# Patient Record
Sex: Male | Born: 1955
Health system: Southern US, Community
[De-identification: ages and names within clinical notes are randomized; demographics above are authoritative.]

## PROBLEM LIST (undated history)

## (undated) ENCOUNTER — Emergency Department (HOSPITAL_COMMUNITY)

## (undated) DIAGNOSIS — N189 Chronic kidney disease, unspecified: Secondary | ICD-10-CM

## (undated) DIAGNOSIS — R350 Frequency of micturition: Secondary | ICD-10-CM

## (undated) DIAGNOSIS — M869 Osteomyelitis, unspecified: Secondary | ICD-10-CM

## (undated) DIAGNOSIS — I739 Peripheral vascular disease, unspecified: Secondary | ICD-10-CM

## (undated) DIAGNOSIS — G709 Myoneural disorder, unspecified: Secondary | ICD-10-CM

## (undated) DIAGNOSIS — M720 Palmar fascial fibromatosis [Dupuytren]: Secondary | ICD-10-CM

## (undated) DIAGNOSIS — K635 Polyp of colon: Secondary | ICD-10-CM

## (undated) DIAGNOSIS — R631 Polydipsia: Secondary | ICD-10-CM

## (undated) DIAGNOSIS — E291 Testicular hypofunction: Secondary | ICD-10-CM

## (undated) DIAGNOSIS — K651 Peritoneal abscess: Secondary | ICD-10-CM

## (undated) DIAGNOSIS — R358 Other polyuria: Secondary | ICD-10-CM

## (undated) DIAGNOSIS — I2510 Atherosclerotic heart disease of native coronary artery without angina pectoris: Secondary | ICD-10-CM

## (undated) DIAGNOSIS — I219 Acute myocardial infarction, unspecified: Secondary | ICD-10-CM

## (undated) DIAGNOSIS — E781 Pure hyperglyceridemia: Secondary | ICD-10-CM

## (undated) DIAGNOSIS — M86271 Subacute osteomyelitis, right ankle and foot: Secondary | ICD-10-CM

## (undated) DIAGNOSIS — R809 Proteinuria, unspecified: Secondary | ICD-10-CM

## (undated) DIAGNOSIS — I509 Heart failure, unspecified: Secondary | ICD-10-CM

## (undated) DIAGNOSIS — R3589 Other polyuria: Secondary | ICD-10-CM

## (undated) DIAGNOSIS — J189 Pneumonia, unspecified organism: Secondary | ICD-10-CM

## (undated) DIAGNOSIS — I10 Essential (primary) hypertension: Secondary | ICD-10-CM

## (undated) DIAGNOSIS — Z973 Presence of spectacles and contact lenses: Secondary | ICD-10-CM

## (undated) HISTORY — DX: Polydipsia: R63.1

## (undated) HISTORY — DX: Essential (primary) hypertension: I10

## (undated) HISTORY — PX: INSERT / REPLACE / REMOVE PACEMAKER: SUR710

## (undated) HISTORY — DX: Other polyuria: R35.8

## (undated) HISTORY — DX: Subacute osteomyelitis, right ankle and foot: M86.271

## (undated) HISTORY — DX: Pure hyperglyceridemia: E78.1

## (undated) HISTORY — DX: Acute myocardial infarction, unspecified: I21.9

## (undated) HISTORY — DX: Proteinuria, unspecified: R80.9

## (undated) HISTORY — DX: Palmar fascial fibromatosis (dupuytren): M72.0

## (undated) HISTORY — DX: Peritoneal abscess: K65.1

## (undated) HISTORY — PX: COLONOSCOPY W/ BIOPSIES AND POLYPECTOMY: SHX1376

## (undated) HISTORY — DX: Peripheral vascular disease, unspecified: I73.9

## (undated) HISTORY — DX: Polyp of colon: K63.5

## (undated) HISTORY — DX: Testicular hypofunction: E29.1

## (undated) HISTORY — DX: Frequency of micturition: R35.0

## (undated) HISTORY — DX: Myoneural disorder, unspecified: G70.9

## (undated) HISTORY — DX: Other polyuria: R35.89

## (undated) HISTORY — PX: OTHER SURGICAL HISTORY: SHX169

## (undated) HISTORY — PX: CATARACT EXTRACTION, BILATERAL: SHX1313

---

## 2007-04-10 DIAGNOSIS — K651 Peritoneal abscess: Secondary | ICD-10-CM

## 2007-04-10 HISTORY — DX: Peritoneal abscess: K65.1

## 2010-09-17 ENCOUNTER — Ambulatory Visit: Payer: Self-pay | Admitting: Vascular Surgery

## 2010-10-29 ENCOUNTER — Ambulatory Visit: Payer: Self-pay | Admitting: Vascular Surgery

## 2011-01-14 ENCOUNTER — Other Ambulatory Visit: Payer: Self-pay | Admitting: Gastroenterology

## 2011-03-24 NOTE — Procedures (Signed)
LOWER EXTREMITY ARTERIAL DUPLEX   INDICATION:  Claudication, rule out peripheral vascular disease per  standing orders.   HISTORY:  Diabetes:  Yes.  Cardiac:  No.  Hypertension:  Yes.  Smoking:  Yes.  Previous Surgery:  No.   SINGLE LEVEL ARTERIAL EXAM                          RIGHT                LEFT  Brachial:               143                  150  Anterior tibial:        88                   152  Posterior tibial:       86                   158  Peroneal:  Ankle/Brachial Index:   0.59                 1.05   LOWER EXTREMITY ARTERIAL DUPLEX EXAM   DUPLEX:  No flow was adequately detected in the right proximal to distal  superficial femoral artery with collateral flow noted at the distal  thigh level.  Patent right common femoral, proximal profunda femoral,  and popliteal arteries.   IMPRESSION:  1. Possible occlusion of the right superficial femoral artery, as      described above.  2. Moderately decreased right ABI with normal left ABI noted.   ___________________________________________  Di Kindle. Edilia Bo, M.D.   CH/MEDQ  D:  09/17/2010  T:  09/17/2010  Job:  [id number]

## 2011-03-24 NOTE — Consult Note (Signed)
VASCULAR SURGERY CONSULTATION   [place], AL  DOB:  06-22-1956                                       10/29/2010  CHART#:[id number]   I saw [name] in the office today in consultation concerning his right  calf claudication.  He was referred by Dr. Talmage Nap.  This is a pleasant 55-  year-old gentleman who had no history of right calf claudication which  began around October 2011.  Symptoms came on gradually.  He noticed  cramping pain in his right calf associated with ambulation and relieved  with rest.  He does a lot of traveling and notices this when walking in  the airport.  He was sent for an arterial Doppler study which was done  in November and showed an ABI of 59% on the right and 100% on the left.  Since his study has been done, he states that his symptoms have actually  improved gradually.  He does experience some right calf claudication.  There are no other aggravating or alleviating factors.  He had no  history of rest pain.  No history of nonhealing wounds.   PAST MEDICAL HISTORY:  Significant for adult onset diabetes which has  been stable on his current medications.  In addition, he has  hypertension and hypercholesterolemia both of which are stable.  He  denies any history of myocardial infarction, history of congestive heart  failure or history of COPD.   SOCIAL HISTORY:  He is married.  He does not have any children.  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  He smokes a pack per day of cigarettes and he has been smoking for 25  years.  He is in the process of trying to quit.  He does not drink  alcohol on a regular basis.   FAMILY HISTORY:  He denies any history of premature cardiovascular  disease.   REVIEW OF SYSTEMS:  GENERAL:  He has had no weight loss, weight gain or  his appetite.  CARDIOVASCULAR:  He has had no chest pain, chest pressure, palpitations  or arrhythmias.  He has had no history of  stroke, TIAs or amaurosis  fugax.  He has had no history of DVT or phlebitis.  GI, neurologic, pulmonary, hematologic, GU, ENT, musculoskeletal,  psychiatric, integumentary review of systems is unremarkable as  documented on the medical history form in his chart.   PHYSICAL EXAMINATION:  This is a pleasant 55 year old gentleman who  appears his stated age.  Temperature is 97.8, blood pressure 135/72, heart rate is 71.  HEENT:  Unremarkable.  LUNGS:  Clear bilaterally to auscultation without rales, rhonchi or  wheezing.  CARDIOVASCULAR:  I do not detect any carotid bruits.  He has a regular  rate and rhythm.  He has palpable femoral pulses.  I cannot palpate the  right popliteal pulse or pedal pulses on the right.  He does have a  palpable left popliteal pulse and pedal pulses on the left in the  dorsalis pedis and posterior tibial positions.  He has no significant  lower extremity swelling.  He has some slight hyperpigmentation  bilaterally.  ABDOMEN:  Soft and nontender with normal pitched bowel sounds.  It is  somewhat difficult to assess for aneurysm because of his size.  MUSCULOSKELETAL:  He has no major deformities  or cyanosis.  NEUROLOGIC:  He has no focal weakness or paresthesias.  SKIN:  There are no ulcers or rashes.   I have independently interpreted his arterial Doppler study which shows  an ABI of 59% on the right and 100% on the left.  Duplex of the  superficial femoral artery shows the superficial femoral artery is  occluded with reconstitution of the popliteal artery and monophasic flow  distally.   I have explained the importance of getting on a structured walking  program.  We have also discussed the importance of tobacco cessation.  I  explained to him I can also write him a prescription for cilostazol  although I do not typically favor starting this given that he is on  quite a few medications already and clearly smoking cessation and  walking are most  important.  With some slight hyperpigmentation  bilaterally, he may have some mild chronic venous insufficiency and we  have discussed the importance of intermittent leg elevation.  He has  been wearing some compression stockings and I have encouraged him not to  wear too tight a stocking on the right leg.  I have asked to see him  back in 1 year and I have ordered follow-up ABIs for that time.  He  knows to call sooner if he has problems.     Di Kindle. Edilia Bo, M.D.  Electronically Signed  CSD/MEDQ  D:  10/29/2010  T:  10/30/2010  Job:  [id number]

## 2011-06-29 ENCOUNTER — Ambulatory Visit (INDEPENDENT_AMBULATORY_CARE_PROVIDER_SITE_OTHER): Payer: BC Managed Care – PPO | Admitting: Ophthalmology

## 2011-06-29 DIAGNOSIS — E11319 Type 2 diabetes mellitus with unspecified diabetic retinopathy without macular edema: Secondary | ICD-10-CM

## 2011-06-29 DIAGNOSIS — H43819 Vitreous degeneration, unspecified eye: Secondary | ICD-10-CM

## 2011-06-29 DIAGNOSIS — Q120 Congenital cataract: Secondary | ICD-10-CM

## 2011-06-29 DIAGNOSIS — E11359 Type 2 diabetes mellitus with proliferative diabetic retinopathy without macular edema: Secondary | ICD-10-CM

## 2011-07-15 ENCOUNTER — Encounter (INDEPENDENT_AMBULATORY_CARE_PROVIDER_SITE_OTHER): Payer: BC Managed Care – PPO | Admitting: Ophthalmology

## 2011-07-22 ENCOUNTER — Encounter (INDEPENDENT_AMBULATORY_CARE_PROVIDER_SITE_OTHER): Payer: BC Managed Care – PPO | Admitting: Ophthalmology

## 2011-07-22 DIAGNOSIS — H3581 Retinal edema: Secondary | ICD-10-CM

## 2011-07-22 DIAGNOSIS — E11359 Type 2 diabetes mellitus with proliferative diabetic retinopathy without macular edema: Secondary | ICD-10-CM

## 2011-07-22 DIAGNOSIS — E11319 Type 2 diabetes mellitus with unspecified diabetic retinopathy without macular edema: Secondary | ICD-10-CM

## 2011-07-22 DIAGNOSIS — H43819 Vitreous degeneration, unspecified eye: Secondary | ICD-10-CM

## 2011-09-11 ENCOUNTER — Encounter: Payer: Self-pay | Admitting: *Deleted

## 2011-09-28 ENCOUNTER — Ambulatory Visit (INDEPENDENT_AMBULATORY_CARE_PROVIDER_SITE_OTHER): Payer: BC Managed Care – PPO | Admitting: Family Medicine

## 2011-09-28 ENCOUNTER — Encounter: Payer: Self-pay | Admitting: Family Medicine

## 2011-09-28 VITALS — BP 130/80 | HR 80 | Ht 70.0 in | Wt 258.0 lb

## 2011-09-28 DIAGNOSIS — Z0000 Encounter for general adult medical examination without abnormal findings: Secondary | ICD-10-CM

## 2011-09-28 DIAGNOSIS — E781 Pure hyperglyceridemia: Secondary | ICD-10-CM | POA: Insufficient documentation

## 2011-09-28 DIAGNOSIS — Z23 Encounter for immunization: Secondary | ICD-10-CM

## 2011-09-28 DIAGNOSIS — E119 Type 2 diabetes mellitus without complications: Secondary | ICD-10-CM | POA: Insufficient documentation

## 2011-09-28 DIAGNOSIS — N529 Male erectile dysfunction, unspecified: Secondary | ICD-10-CM

## 2011-09-28 DIAGNOSIS — E11319 Type 2 diabetes mellitus with unspecified diabetic retinopathy without macular edema: Secondary | ICD-10-CM | POA: Insufficient documentation

## 2011-09-28 DIAGNOSIS — F17200 Nicotine dependence, unspecified, uncomplicated: Secondary | ICD-10-CM

## 2011-09-28 DIAGNOSIS — Z125 Encounter for screening for malignant neoplasm of prostate: Secondary | ICD-10-CM

## 2011-09-28 DIAGNOSIS — D126 Benign neoplasm of colon, unspecified: Secondary | ICD-10-CM | POA: Insufficient documentation

## 2011-09-28 DIAGNOSIS — E1139 Type 2 diabetes mellitus with other diabetic ophthalmic complication: Secondary | ICD-10-CM

## 2011-09-28 LAB — POCT URINALYSIS DIPSTICK
Bilirubin, UA: NEGATIVE
Glucose, UA: NEGATIVE
Leukocytes, UA: NEGATIVE
Nitrite, UA: NEGATIVE

## 2011-09-28 LAB — COMPREHENSIVE METABOLIC PANEL
ALT: 11 U/L (ref 0–53)
AST: 13 U/L (ref 0–37)
Albumin: 4.1 g/dL (ref 3.5–5.2)
Alkaline Phosphatase: 90 U/L (ref 39–117)
BUN: 21 mg/dL (ref 6–23)
Calcium: 9.6 mg/dL (ref 8.4–10.5)
Chloride: 106 mEq/L (ref 96–112)
Potassium: 5.3 mEq/L (ref 3.5–5.3)
Sodium: 139 mEq/L (ref 135–145)
Total Protein: 6.5 g/dL (ref 6.0–8.3)

## 2011-09-28 LAB — LIPID PANEL
Cholesterol: 144 mg/dL (ref 0–200)
VLDL: 21 mg/dL (ref 0–40)

## 2011-09-28 NOTE — Progress Notes (Signed)
Eduardo [name] is a 55 y.o. male who presents for a complete physical.  He has the following concerns: Recent GI illness--last episode of diarrhea was Tuesday, finally feeling better Diabetes--sees Dr. Talmage Nap.  Has appt with her in December, but needs labs for insurance purposes before 12/13. Brought in forms to be filled out.  He hasn't had a PCP, last having seen me in 2007, just seeing Dr. Talmage Nap  Immunization History  Administered Date(s) Administered  . Influenza Split 08/10/2011  . Td 06/03/2005  Thinks he had a pneumovax in 2005 Last colonoscopy: 12/2010 Dr. Ewing Schlein, +polyp, due for repeat in 3 years Last PSA: unknown, ?ever Dentist: August, goes twice yearly Ophtho: Dr. Ashley Royalty regularly  Exercise: less in winter than in Spring and summer (kayaks).  Walks, but "not enough".  Travels 40 weeks/year  Past Medical History  Diagnosis Date  . Diabetes mellitus 1987    under care of Dr. Talmage Nap.  On insulin since 96 (off and on)  . Peritoneal abscess 6/08    and buttock.  . Pure hyperglyceridemia   . Essential hypertension, benign   . Diabetic retinopathy   . Dupuytren contracture     R hand, s/p injection (Dr. Izora Ribas)    Past Surgical History  Procedure Date  . Macular photocoagulation     (eye treatments for diabetic retinopathy)-Dr. Ashley Royalty    History   Social History  . Marital Status: Married    Spouse Name: N/A    Number of Children: 0  . Years of Education: N/A   Occupational History  . 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 Smoking status: Current Everyday Smoker -- 1.0 packs/day for 30 years    Types: Cigarettes  . Smokeless tobacco: Not on file   Comment: plans to quit in January  . Alcohol Use: No  . Drug Use: No  . Sexually Active: Not on file   Other Topics Concern  . Not on file   Social History Narrative   Lives with his wife.  Travels 40 weeks out of the year. No pets   Current outpatient  prescriptions:aspirin 81 MG tablet, Take 81 mg by mouth daily.  , Disp: , Rfl: ;  Bioflavonoid Products (ESTER-C) 1000-50 MG TABS, Take 1 capsule by mouth daily.  , Disp: , Rfl: ;  gemfibrozil (LOPID) 600 MG tablet, Take 600 mg by mouth 2 (two) times daily before a meal.  , Disp: , Rfl: ;  Glucosamine HCl-MSM 750-750 MG TABS, Take 2 capsules by mouth daily.  , Disp: , Rfl:  insulin lispro protamine-insulin lispro (HUMALOG 75/25) (75-25) 100 UNIT/ML SUSP, Inject 20 Units into the skin 2 (two) times daily with a meal.  , Disp: , Rfl: ;  lisinopril (PRINIVIL,ZESTRIL) 40 MG tablet, Take 40 mg by mouth daily.  , Disp: , Rfl: ;  sitaGLIPtan-metformin (JANUMET) 50-1000 MG per tablet, Take 1 tablet by mouth 2 (two) times daily with a meal.  , Disp: , Rfl:   No Known Allergies  ROS:  The patient denies fever, weight changes (?slight loss, trying), headaches,  vision loss, decreased hearing, ear pain, hoarseness, chest pain, palpitations, dizziness, syncope, dyspnea on exertion, cough, swelling, nausea, vomiting, diarrhea, constipation, abdominal pain, melena, hematochezia, indigestion/heartburn, hematuria, incontinence, nocturia, weakened urine stream, dysuria, genital lesions, joint pains, numbness, tingling, weakness, tremor, suspicious skin lesions, depression, anxiety, abnormal bleeding/bruising, or enlarged lymph nodes Denies apnea, feels refreshed in mornings. Sees dermatologist regularly (  all biopsies benign). +erectile dysfunction.  Tried Viagra in past, not very effective, but he isn't very interested in fixing.  PHYSICAL EXAM: BP 130/80  Pulse 80  Ht 5\' 10"  (1.778 m)  Wt 258 lb (117.028 kg)  BMI 37.02 kg/m2  General Appearance:    Alert, cooperative, no distress, appears stated age  Head:    Normocephalic, without obvious abnormality, atraumatic  Eyes:    PERRL, conjunctiva/corneas clear, EOM's intact, fundi    Not well visualized  Ears:    Normal TM's and external ear canals  Nose:   Nares  normal, mucosa normal, no drainage or sinus   tenderness  Throat:   Lips, mucosa, and tongue normal; teeth and gums normal  Neck:   Supple, no lymphadenopathy;  thyroid:  no   enlargement/tenderness/nodules; no carotid   bruit or JVD  Back:    Spine nontender, no curvature, ROM normal, no CVA     tenderness  Lungs:     Clear to auscultation bilaterally without wheezes, rales or     ronchi; respirations unlabored  Chest Wall:    No tenderness or deformity   Heart:    Regular rate and rhythm, S1 and S2 normal, no murmur, rub   or gallop  Breast Exam:    No chest wall tenderness, masses or gynecomastia  Abdomen:     Soft, non-tender, nondistended, normoactive bowel sounds,    no masses, no hepatosplenomegaly  Genitalia:    Normal male external genitalia without lesions.  Testicles without masses.  No inguinal hernias.  Rectal:    Normal sphincter tone, no masses or tenderness; guaiac negative stool.  Prostate smooth, no nodules, not enlarged.  Extremities:   No clubbing, cyanosis or edema  Pulses:   2+ and symmetric all extremities  Skin:   Skin color, texture, turgor normal, no rashes or lesions. Very small, superficial ulceration R 2nd toe, medial aspect (sharp lateral aspect of great toenail rubs against it here)  Lymph nodes:   Cervical, supraclavicular, and axillary nodes normal  Neurologic:   CNII-XII intact, normal strength, sensation and gait; reflexes 2+ and symmetric throughout          Psych:   Normal mood, affect, hygiene and grooming.    ASSESSMENT/PLAN:  1. Routine general medical examination at a health care facility  POCT Urinalysis Dipstick, Visual acuity screening  2. Tobacco use disorder    3. Adenomatous colon polyp    4. Need for Tdap vaccination  Tdap vaccine greater than or equal to 7yo IM  5. Need for pneumococcal vaccination  Pneumococcal polysaccharide vaccine 23-valent greater than or equal to 2yo subcutaneous/IM  6. Type II or unspecified type diabetes mellitus  without mention of complication, not stated as uncontrolled  Comprehensive metabolic panel, CBC with Differential, Hemoglobin A1c  7. Special screening for malignant neoplasm of prostate  PSA  8. Pure hyperglyceridemia  Lipid panel  9. Erectile dysfunction  Testosterone  10. Diabetic retinopathy      Discussed PSA screening (risks/benefits), recommended at least 30 minutes of aerobic activity at least 5 days/week; proper sunscreen use reviewed; healthy diet and alcohol recommendations (less than or equal to 2 drinks/day) reviewed; regular seatbelt use; changing batteries in smoke detectors. Self-testicular exams. Immunization recommendations discussed--TdaP and pneumovax given.  Discussed Zostavax at age 56.  Colonoscopy recommendations reviewed--due again 2013.  Discussed need for increased exercise, portion control and weight loss.  Send copies of labs to Dr. Talmage Nap  CBC, c-met, FLP, A1c, PSA, testosterone  drawn today

## 2011-09-28 NOTE — Patient Instructions (Signed)
HEALTH MAINTENANCE RECOMMENDATIONS:  It is recommended that you get at least 30 minutes of aerobic exercise at least 5 days/week (for weight loss, you may need as much as 60-90 minutes). This can be any activity that gets your heart rate up. This can be divided in 10-15 minute intervals if needed, but try and build up your endurance at least once a week.  Weight bearing exercise is also recommended twice weekly.  Eat a healthy diet with lots of vegetables, fruits and fiber.  "Colorful" foods have a lot of vitamins (ie green vegetables, tomatoes, red peppers, etc).  Limit sweet tea, regular sodas and alcoholic beverages, all of which has a lot of calories and sugar.  Up to 2 alcoholic drinks daily may be beneficial for men (unless trying to lose weight, watch sugars).  Drink a lot of water.  Sunscreen of at least SPF 30 should be used on all sun-exposed parts of the skin when outside between the hours of 10 am and 4 pm (not just when at beach or pool, but even with exercise, golf, tennis, and yard work!)  Use a sunscreen that says "broad spectrum" so it covers both UVA and UVB rays, and make sure to reapply every 1-2 hours.  Remember to change the batteries in your smoke detectors when changing your clock times in the spring and fall.  Use your seat belt every time you are in a car, and please drive safely and not be distracted with cell phones and texting while driving.     

## 2011-09-29 ENCOUNTER — Encounter: Payer: Self-pay | Admitting: Family Medicine

## 2011-09-29 LAB — CBC WITH DIFFERENTIAL/PLATELET
Basophils Absolute: 0.1 10*3/uL (ref 0.0–0.1)
Basophils Relative: 1 % (ref 0–1)
Eosinophils Absolute: 1.9 10*3/uL — ABNORMAL HIGH (ref 0.0–0.7)
Hemoglobin: 15.4 g/dL (ref 13.0–17.0)
MCH: 31.5 pg (ref 26.0–34.0)
MCHC: 33.5 g/dL (ref 30.0–36.0)
Neutro Abs: 7.3 10*3/uL (ref 1.7–7.7)
Neutrophils Relative %: 56 % (ref 43–77)
Platelets: 275 10*3/uL (ref 150–400)
RDW: 13.4 % (ref 11.5–15.5)

## 2011-09-29 LAB — HEMOGLOBIN A1C
Hgb A1c MFr Bld: 8.3 % — ABNORMAL HIGH (ref <5.7)
Mean Plasma Glucose: 192 mg/dL — ABNORMAL HIGH (ref <117)

## 2011-11-02 ENCOUNTER — Encounter: Payer: Self-pay | Admitting: Family Medicine

## 2011-11-23 ENCOUNTER — Ambulatory Visit (INDEPENDENT_AMBULATORY_CARE_PROVIDER_SITE_OTHER): Payer: BC Managed Care – PPO | Admitting: Ophthalmology

## 2011-11-27 ENCOUNTER — Ambulatory Visit (INDEPENDENT_AMBULATORY_CARE_PROVIDER_SITE_OTHER): Payer: BC Managed Care – PPO | Admitting: Ophthalmology

## 2011-11-27 DIAGNOSIS — H2510 Age-related nuclear cataract, unspecified eye: Secondary | ICD-10-CM

## 2011-11-27 DIAGNOSIS — E1139 Type 2 diabetes mellitus with other diabetic ophthalmic complication: Secondary | ICD-10-CM

## 2011-11-27 DIAGNOSIS — E11359 Type 2 diabetes mellitus with proliferative diabetic retinopathy without macular edema: Secondary | ICD-10-CM

## 2011-11-27 DIAGNOSIS — E11319 Type 2 diabetes mellitus with unspecified diabetic retinopathy without macular edema: Secondary | ICD-10-CM

## 2011-11-27 DIAGNOSIS — H43819 Vitreous degeneration, unspecified eye: Secondary | ICD-10-CM

## 2012-03-22 ENCOUNTER — Other Ambulatory Visit: Payer: Self-pay

## 2012-03-22 DIAGNOSIS — I70219 Atherosclerosis of native arteries of extremities with intermittent claudication, unspecified extremity: Secondary | ICD-10-CM

## 2012-03-23 ENCOUNTER — Encounter: Payer: Self-pay | Admitting: Vascular Surgery

## 2012-04-01 ENCOUNTER — Encounter: Payer: Self-pay | Admitting: Vascular Surgery

## 2012-04-05 ENCOUNTER — Encounter: Payer: BC Managed Care – PPO | Admitting: Vascular Surgery

## 2012-04-05 ENCOUNTER — Encounter: Payer: Self-pay | Admitting: Vascular Surgery

## 2012-04-05 ENCOUNTER — Ambulatory Visit (INDEPENDENT_AMBULATORY_CARE_PROVIDER_SITE_OTHER): Payer: BC Managed Care – PPO | Admitting: Vascular Surgery

## 2012-04-05 ENCOUNTER — Encounter (INDEPENDENT_AMBULATORY_CARE_PROVIDER_SITE_OTHER): Payer: BC Managed Care – PPO | Admitting: *Deleted

## 2012-04-05 ENCOUNTER — Encounter: Payer: Self-pay | Admitting: *Deleted

## 2012-04-05 VITALS — BP 151/80 | HR 64 | Resp 18 | Ht 70.0 in | Wt 284.6 lb

## 2012-04-05 DIAGNOSIS — I70219 Atherosclerosis of native arteries of extremities with intermittent claudication, unspecified extremity: Secondary | ICD-10-CM

## 2012-04-05 DIAGNOSIS — I739 Peripheral vascular disease, unspecified: Secondary | ICD-10-CM

## 2012-04-05 NOTE — Progress Notes (Signed)
The patient presents today for evaluation of progressively severe right leg claudication. He had seen Dr. Edilia Bo in our office for complete evaluation of this in December of 2011. At that time he was having calf claudication and this was tolerable to him. The importance of smoking cessation walking program were stressed with the patient. He did continue to smoke unfortunately did stop in March of this year and I again emphasized the critical importance of smoking cessation. 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 The claudication begins in his calf but he does have right thigh and buttock claudication with walking as well. He has to stop and rest and then resume his activities. He does have a mother sensation of aching in his right leg at which is positional such as when he's and a new. I explained that most likely this positional the discomfort is not related arterial insufficiency. He does not have any history of tissue loss.  Past Medical History  Diagnosis Date  . Peritoneal abscess 6/08    and buttock.  . Diabetic retinopathy   . Dupuytren contracture     R hand, s/p injection (Dr. Izora Ribas)  . Colon polyp   . Peripheral vascular disease   . Proteinuria   . Frequency of urination and polyuria   . Polydipsia   . Diabetes mellitus 1987    under care of Dr. Talmage Nap.  On insulin since 96 (off and on)  . Neuromuscular disorder     Diabetic neuropathy  . Hypertension   . Essential hypertension, benign   . Pure hyperglyceridemia   . Other testicular hypofunction     History  Substance Use Topics  . Smoking status: Former Smoker -- 1.0 packs/day for 30 years    Types: Cigarettes    Quit date: 01/08/2012  . Smokeless tobacco: Not on file   Comment: plans to quit in January  . Alcohol Use: No    Family History  Problem Relation Age of Onset  . Diabetes Mother     No Known  Allergies  Current outpatient prescriptions:aspirin 81 MG tablet, Take 81 mg by mouth daily.  , Disp: , Rfl: ;  Bioflavonoid Products (ESTER-C) 1000-50 MG TABS, Take 1 capsule by mouth daily.  , Disp: , Rfl: ;  diltiazem (TIAZAC) 120 MG 24 hr capsule, Take 120 mg by mouth daily., Disp: , Rfl: ;  gemfibrozil (LOPID) 600 MG tablet, Take 600 mg by mouth 2 (two) times daily before a meal.  , Disp: , Rfl:  Glucosamine HCl-MSM 750-750 MG TABS, Take 2 capsules by mouth daily.  , Disp: , Rfl: ;  insulin lispro protamine-insulin lispro (HUMALOG 75/25) (75-25) 100 UNIT/ML SUSP, Inject 40 Units into the skin 2 (two) times daily with a meal. Takes 40 units in AM. Takes 40-50 units in PM., Disp: , Rfl: ;  lisinopril (PRINIVIL,ZESTRIL) 40 MG tablet, Take 40 mg by mouth daily.  , Disp: , Rfl:  sitaGLIPtan-metformin (JANUMET) 50-1000 MG per tablet, Take 1 tablet by mouth 2 (two) times daily with a meal.  , Disp: , Rfl:   BP 151/80  Pulse 64  Resp 18  Ht 5\' 10"  (1.778 m)  Wt 284 lb 9.6 oz (129.094 kg)  BMI 40.84 kg/m2  Body mass index is 40.84 kg/(m^2).       Review of systems positive for leg discomfort and swelling otherwise negative except for past  medical history  Vascular lab: Ankle arm index stable at 0.56 on the right and 0.94 on the left. He is monophasic waveforms on the right. Ankle arm index in November of 2011 was 0.59 on the right and 1.0  Impression and plan: Progressive claudication symptoms which are now limiting. He does have a right hip and buttock claudication in addition to the calf claudication. I have recommended arteriography to further evaluation explained that he may have a lesion amenable to angioplasty. He understands that this is not currently limb threatening. He is scheduled for outpatient arteriogram with Dr. Edilia Bo on 6/10 at [facility]

## 2012-04-06 ENCOUNTER — Other Ambulatory Visit: Payer: Self-pay | Admitting: *Deleted

## 2012-04-11 ENCOUNTER — Encounter (HOSPITAL_COMMUNITY): Payer: Self-pay | Admitting: Pharmacy Technician

## 2012-04-18 ENCOUNTER — Encounter (HOSPITAL_COMMUNITY)
Admission: RE | Disposition: A | Discharge status: Home / Self Care | Payer: Self-pay | Source: Ambulatory Visit | Attending: Vascular Surgery

## 2012-04-18 ENCOUNTER — Telehealth: Payer: Self-pay | Admitting: Vascular Surgery

## 2012-04-18 ENCOUNTER — Other Ambulatory Visit: Payer: Self-pay | Admitting: *Deleted

## 2012-04-18 ENCOUNTER — Ambulatory Visit (HOSPITAL_COMMUNITY)
Admission: RE | Admit: 2012-04-18 | Discharge: 2012-04-18 | Disposition: A | Discharge status: Home / Self Care | Payer: BC Managed Care – PPO | Source: Ambulatory Visit | Attending: Vascular Surgery | Admitting: Vascular Surgery

## 2012-04-18 DIAGNOSIS — E1149 Type 2 diabetes mellitus with other diabetic neurological complication: Secondary | ICD-10-CM | POA: Insufficient documentation

## 2012-04-18 DIAGNOSIS — E11359 Type 2 diabetes mellitus with proliferative diabetic retinopathy without macular edema: Secondary | ICD-10-CM | POA: Insufficient documentation

## 2012-04-18 DIAGNOSIS — M720 Palmar fascial fibromatosis [Dupuytren]: Secondary | ICD-10-CM | POA: Insufficient documentation

## 2012-04-18 DIAGNOSIS — Z01810 Encounter for preprocedural cardiovascular examination: Secondary | ICD-10-CM

## 2012-04-18 DIAGNOSIS — I70219 Atherosclerosis of native arteries of extremities with intermittent claudication, unspecified extremity: Secondary | ICD-10-CM

## 2012-04-18 DIAGNOSIS — E1139 Type 2 diabetes mellitus with other diabetic ophthalmic complication: Secondary | ICD-10-CM | POA: Insufficient documentation

## 2012-04-18 DIAGNOSIS — I10 Essential (primary) hypertension: Secondary | ICD-10-CM | POA: Insufficient documentation

## 2012-04-18 DIAGNOSIS — E781 Pure hyperglyceridemia: Secondary | ICD-10-CM | POA: Insufficient documentation

## 2012-04-18 DIAGNOSIS — E1142 Type 2 diabetes mellitus with diabetic polyneuropathy: Secondary | ICD-10-CM | POA: Insufficient documentation

## 2012-04-18 HISTORY — PX: ABDOMINAL AORTAGRAM: SHX5454

## 2012-04-18 HISTORY — PX: LOWER EXTREMITY ANGIOGRAM: SHX5508

## 2012-04-18 SURGERY — ABDOMINAL AORTAGRAM
Anesthesia: LOCAL

## 2012-04-18 MED ORDER — LIDOCAINE HCL (PF) 1 % IJ SOLN
INTRAMUSCULAR | Status: AC
  Filled 2012-04-18: qty 30

## 2012-04-18 MED ORDER — MIDAZOLAM HCL 2 MG/2ML IJ SOLN
INTRAMUSCULAR | Status: AC
  Filled 2012-04-18: qty 2

## 2012-04-18 MED ORDER — ACETAMINOPHEN 325 MG PO TABS: 650.0000 mg | ORAL_TABLET | ORAL | Status: DC | PRN

## 2012-04-18 MED ORDER — HEPARIN (PORCINE) IN NACL 2-0.9 UNIT/ML-% IJ SOLN
INTRAMUSCULAR | Status: AC
  Filled 2012-04-18: qty 1000

## 2012-04-18 MED ORDER — ONDANSETRON HCL 4 MG/2ML IJ SOLN: 4.0000 mg | Freq: Four times a day (QID) | INTRAMUSCULAR | Status: DC | PRN

## 2012-04-18 MED ORDER — SODIUM CHLORIDE 0.9 % IV SOLN: 1.0000 mL/kg/h | INTRAVENOUS | Status: DC

## 2012-04-18 MED ORDER — FENTANYL CITRATE 0.05 MG/ML IJ SOLN
INTRAMUSCULAR | Status: AC
  Filled 2012-04-18: qty 2

## 2012-04-18 MED ORDER — SODIUM CHLORIDE 0.9 % IV SOLN
INTRAVENOUS | Status: DC
  Administered 2012-04-18: 07:00:00 via INTRAVENOUS

## 2012-04-18 NOTE — Interval H&P Note (Signed)
History and Physical Interval Note:  04/18/2012 7:25 AM  Eduardo [name]  has presented today for surgery, with the diagnosis of PVD  The various methods of treatment have been discussed with the patient and family. After consideration of risks, benefits and other options for treatment, the patient has consented to: ABDOMINAL AORTAGRAM POSSIBLE ANGIOPLASTY AND STENTING OF RIGHT LEG.  The patients' history has been reviewed, patient examined, no change in status, stable for surgery.  I have reviewed the patients' chart and labs.  Questions were answered to the patient's satisfaction.     Icis Budreau S

## 2012-04-18 NOTE — Discharge Instructions (Signed)

## 2012-04-18 NOTE — Telephone Encounter (Addendum)
Message copied by Shari Prows on Mon Apr 18, 2012 10:12 AM ------      Message from: Melene Plan      Created: Mon Apr 18, 2012  9:42 AM      Regarding: FW: charges and follow up                   ----- Message -----         From: Chuck Hint, MD         Sent: 04/18/2012   8:23 AM           To: Reuel Derby, Melene Plan, RN      Subject: charges and follow up                                    PROCEDURE:       1. Ultrasound-guided access to the right common femoral artery      2. Aortogram with bilateral iliac arteriogram      3. Selective catheterization of the left external iliac artery with left lower extremity runoff      4. Retrograde right femoral arteriogram with right lower extremity runoff      5. Perclose of the right common femoral artery            SURGEON: Di Kindle. Edilia Bo, MD, FACS            He needs an office visit in the next couple of weeks to discuss right femoropopliteal bypass grafting. He'll need vein mapping of his right greater saphenous vein when he comes in. Thank you. CSD  I scheduled an appt for post op fu per staff message for Wed 05/04/12 at 2:30pm. I left message on voicemail at hm ph # and also mailed appt letter to pt's home. Jacklyn Shell

## 2012-04-18 NOTE — Op Note (Signed)
PATIENT: Eduardo [name]   MRN: [id number] DOB: 1956/08/04    DATE OF PROCEDURE: 04/18/2012  INDICATIONS: Eduardo [name] is a 56 y.o. male who presents with progressive disabling claudication of the right lower extremity. He was set up for arteriography.  PROCEDURE:  1. Ultrasound-guided access to the right common femoral artery 2. Aortogram with bilateral iliac arteriogram 3. Selective catheterization of the left external iliac artery with left lower extremity runoff 4. Retrograde right femoral arteriogram with right lower extremity runoff 5. Perclose of the right common femoral artery  SURGEON: Di Kindle. Edilia Bo, MD, FACS  ANESTHESIA: local with sedation   EBL: minimal  TECHNIQUE: The patient was brought to the peripheral vascular lab and received 1 mg of Versed and 50 mcg of fentanyl. Both groins were prepped and draped in the usual sterile fashion. After the skin was infiltrated with 1% lidocaine, and under ultrasound guidance, the right common femoral artery was cannulated and a guidewire introduced into the infrarenal aorta under fluoroscopic control. A 5 French sheath was introduced over the wire. A pigtail catheter was positioned at the L1 vertebral body. A flush aortogram was obtained. The catheter was then brought down above the aortic bifurcation and oblique iliac projections were obtained. The pigtail catheter was exchange for a crossover catheter. This was positioned in the left common iliac artery and a wire was advanced into the external iliac artery. The crossover catheter was then exchanged for an endhole catheter. Selective left external iliac arteriogram was obtained with left lower extremity runoff. The endhole catheter was then removed in a retrograde right femoral arteriogram performed with right lower extremity runoff. At the completion of the procedure, the 5 French sheath was exchanged for the Perclose device which was advanced until there was good return and the wire had  been removed. The suture was engaged and was good hemostasis the suture was tied. Pressure was held for 10 minutes. No immediate complications were noted.  FINDINGS:  1. There are single renal arteries bilaterally with no significant renal artery stenosis identified. 2. On the right side, which is the symptomatic side, the common iliac artery is widely patent. There is a smooth 20% narrowing at the distal right common iliac and proximal external iliac artery. This does not appear to be flow-limiting. The hypogastric artery on the right is patent. The common femoral artery is patent and there is mild diffuse disease of the deep femoral artery on the right. The superficial femoral artery is occluded at its origin with reconstitution knee popliteal artery which has moderate diffuse disease. The below-knee popliteal artery is patent and is two-vessel runoff via the peroneal and posterior tibial arteries. The anterior tibial artery is occluded. 3. On the left side, there is moderate diffuse disease the deep femoral artery. The common femoral artery and superficial femoral artery are patent. The popliteal artery is patent. There is proximal disease in the anterior tibial peroneal and posterior tibial arteries. However the arteries are patent beyond this.  Waverly Ferrari, MD, FACS [facility]  DATE OF DICTATION:   04/18/2012

## 2012-04-18 NOTE — Progress Notes (Signed)
Patient walked in hallway without difficulty. Right groin dressing CDI. VSS.

## 2012-04-18 NOTE — H&P (View-Only) (Signed)
The patient presents today for evaluation of progressively severe right leg claudication. He had seen Dr. Dickson in our office for complete evaluation of this in December of 2011. At that time he was having calf claudication and this was tolerable to him. The importance of smoking cessation walking program were stressed with the patient. He did continue to smoke unfortunately did stop in March of this year and I again emphasized the critical importance of smoking cessation. 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 The claudication begins in his calf but he does have right thigh and buttock claudication with walking as well. He has to stop and rest and then resume his activities. He does have a mother sensation of aching in his right leg at which is positional such as when he's and a new. I explained that most likely this positional the discomfort is not related arterial insufficiency. He does not have any history of tissue loss.  Past Medical History  Diagnosis Date  . Peritoneal abscess 6/08    and buttock.  . Diabetic retinopathy   . Dupuytren contracture     R hand, s/p injection (Dr. Coley)  . Colon polyp   . Peripheral vascular disease   . Proteinuria   . Frequency of urination and polyuria   . Polydipsia   . Diabetes mellitus 1987    under care of Dr. Balan.  On insulin since 96 (off and on)  . Neuromuscular disorder     Diabetic neuropathy  . Hypertension   . Essential hypertension, benign   . Pure hyperglyceridemia   . Other testicular hypofunction     History  Substance Use Topics  . Smoking status: Former Smoker -- 1.0 packs/day for 30 years    Types: Cigarettes    Quit date: 01/08/2012  . Smokeless tobacco: Not on file   Comment: plans to quit in January  . Alcohol Use: No    Family History  Problem Relation Age of Onset  . Diabetes Mother     No Known  Allergies  Current outpatient prescriptions:aspirin 81 MG tablet, Take 81 mg by mouth daily.  , Disp: , Rfl: ;  Bioflavonoid Products (ESTER-C) 1000-50 MG TABS, Take 1 capsule by mouth daily.  , Disp: , Rfl: ;  diltiazem (TIAZAC) 120 MG 24 hr capsule, Take 120 mg by mouth daily., Disp: , Rfl: ;  gemfibrozil (LOPID) 600 MG tablet, Take 600 mg by mouth 2 (two) times daily before a meal.  , Disp: , Rfl:  Glucosamine HCl-MSM 750-750 MG TABS, Take 2 capsules by mouth daily.  , Disp: , Rfl: ;  insulin lispro protamine-insulin lispro (HUMALOG 75/25) (75-25) 100 UNIT/ML SUSP, Inject 40 Units into the skin 2 (two) times daily with a meal. Takes 40 units in AM. Takes 40-50 units in PM., Disp: , Rfl: ;  lisinopril (PRINIVIL,ZESTRIL) 40 MG tablet, Take 40 mg by mouth daily.  , Disp: , Rfl:  sitaGLIPtan-metformin (JANUMET) 50-1000 MG per tablet, Take 1 tablet by mouth 2 (two) times daily with a meal.  , Disp: , Rfl:   BP 151/80  Pulse 64  Resp 18  Ht 5' 10" (1.778 m)  Wt 284 lb 9.6 oz (129.094 kg)  BMI 40.84 kg/m2  Body mass index is 40.84 kg/(m^2).       Review of systems positive for leg discomfort and swelling otherwise negative except for past   medical history  Vascular lab: Ankle arm index stable at 0.56 on the right and 0.94 on the left. He is monophasic waveforms on the right. Ankle arm index in November of 2011 was 0.59 on the right and 1.0  Impression and plan: Progressive claudication symptoms which are now limiting. He does have a right hip and buttock claudication in addition to the calf claudication. I have recommended arteriography to further evaluation explained that he may have a lesion amenable to angioplasty. He understands that this is not currently limb threatening. He is scheduled for outpatient arteriogram with Dr. Dickson on 6/10 at [facility] 

## 2012-04-19 LAB — POCT I-STAT, CHEM 8
BUN: 35 mg/dL — ABNORMAL HIGH (ref 6–23)
Calcium, Ion: 1.22 mmol/L (ref 1.12–1.32)
Chloride: 111 mEq/L (ref 96–112)
Glucose, Bld: 90 mg/dL (ref 70–99)
HCT: 40 % (ref 39.0–52.0)

## 2012-04-26 ENCOUNTER — Encounter: Payer: Self-pay | Admitting: Vascular Surgery

## 2012-04-27 ENCOUNTER — Ambulatory Visit (INDEPENDENT_AMBULATORY_CARE_PROVIDER_SITE_OTHER): Payer: BC Managed Care – PPO | Admitting: Vascular Surgery

## 2012-04-27 ENCOUNTER — Encounter: Payer: Self-pay | Admitting: Vascular Surgery

## 2012-04-27 VITALS — BP 130/84 | HR 65 | Temp 98.5°F | Ht 70.0 in | Wt 283.0 lb

## 2012-04-27 DIAGNOSIS — Z01810 Encounter for preprocedural cardiovascular examination: Secondary | ICD-10-CM

## 2012-04-27 DIAGNOSIS — I70219 Atherosclerosis of native arteries of extremities with intermittent claudication, unspecified extremity: Secondary | ICD-10-CM

## 2012-04-27 NOTE — Progress Notes (Signed)
Right lower extremity vein map performed @ [facility] 04/27/2012

## 2012-04-27 NOTE — Progress Notes (Signed)
[facility] [place]  Patient name: Eduardo [name] MRN: [id number] DOB: 12/18/1955 Sex: male  REASON FOR VISIT: follow up after arteriogram  HPI: Eduardo [name] is a 57 y.o. male who is developed progressive claudication of his right lower extremity. He was seen by Dr. Arbie Cookey and set up for an arteriogram. I previously seen him in the past of claudication. 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 He comes in to discuss these results. His claudication symptoms in the right are stable. He's had no rest pain and no history of nonhealing ulcers.   REVIEW OF SYSTEMS: Arly.Keller ] denotes positive finding; [  ] denotes negative finding  CARDIOVASCULAR:  [ ]  chest pain   [ ]  dyspnea on exertion    CONSTITUTIONAL:  [ ]  fever   [ ]  chills  PHYSICAL EXAM: Filed Vitals:   04/27/12 1549  BP: 130/84  Pulse: 65  Temp: 98.5 F (36.9 C)  TempSrc: Oral  Height: 5\' 10"  (1.778 m)  Weight: 283 lb (128.368 kg)  SpO2: 97%   Body mass index is 40.61 kg/(m^2). GENERAL: The patient is a well-nourished male, in no acute distress. The vital signs are documented above. CARDIOVASCULAR: There is a regular rate and rhythm  PULMONARY: There is good air exchange bilaterally without wheezing or rales. He has palpable femoral pulses. I cannot palpate pedal pulses.  MEDICAL ISSUES: I've discussed the option of conservative treatment versus proceeding with right femoropopliteal bypass grafting. He did have a vein map today which shows that his vein does appear to be a reasonable for a vein conduit. He did quit smoking on March 1. Discussed with him the importance of a structured walking program. We also discussed the potential use of Pletal both agreed would hold off on this for now. He is comfortable attempting conservative treatment with a structured  walking program and continued tobacco cessation. See him back in 6 months. As to call sooner if he has problems.  Jermanie Minshall S [facility] [place] Beeper: [phone]

## 2012-05-02 ENCOUNTER — Encounter: Payer: Self-pay | Admitting: Vascular Surgery

## 2012-05-04 ENCOUNTER — Ambulatory Visit: Payer: BC Managed Care – PPO | Admitting: Vascular Surgery

## 2012-05-04 NOTE — Procedures (Unsigned)
VASCULAR LAB EXAM  INDICATION:  Right lower extremity claudication, preoperative cardiovascular examination.  HISTORY: Diabetes:  Yes. Cardiac:  No. Hypertension:  Yes. Smoking:  Previous.  EXAM:  Right lower extremity vein mapping.  IMPRESSION: 1. Patent right great saphenous vein with several branches noted along     its course. 2. Branches and diameters are as noted on the worksheet.  ___________________________________________ Di Kindle. Edilia Bo, M.D.  SH/MEDQ  D:  04/27/2012  T:  04/27/2012  Job:  [id number]

## 2012-06-06 ENCOUNTER — Ambulatory Visit (INDEPENDENT_AMBULATORY_CARE_PROVIDER_SITE_OTHER): Payer: BC Managed Care – PPO | Admitting: Ophthalmology

## 2012-06-06 DIAGNOSIS — E1139 Type 2 diabetes mellitus with other diabetic ophthalmic complication: Secondary | ICD-10-CM

## 2012-06-06 DIAGNOSIS — H2510 Age-related nuclear cataract, unspecified eye: Secondary | ICD-10-CM

## 2012-06-06 DIAGNOSIS — H35039 Hypertensive retinopathy, unspecified eye: Secondary | ICD-10-CM

## 2012-06-06 DIAGNOSIS — H43819 Vitreous degeneration, unspecified eye: Secondary | ICD-10-CM

## 2012-06-06 DIAGNOSIS — I10 Essential (primary) hypertension: Secondary | ICD-10-CM

## 2012-06-06 DIAGNOSIS — E11359 Type 2 diabetes mellitus with proliferative diabetic retinopathy without macular edema: Secondary | ICD-10-CM

## 2012-06-06 DIAGNOSIS — E11319 Type 2 diabetes mellitus with unspecified diabetic retinopathy without macular edema: Secondary | ICD-10-CM

## 2012-09-28 ENCOUNTER — Encounter: Payer: BC Managed Care – PPO | Admitting: Family Medicine

## 2012-09-30 ENCOUNTER — Encounter: Payer: Self-pay | Admitting: Internal Medicine

## 2012-10-13 ENCOUNTER — Ambulatory Visit (INDEPENDENT_AMBULATORY_CARE_PROVIDER_SITE_OTHER): Payer: BC Managed Care – PPO | Admitting: Family Medicine

## 2012-10-13 ENCOUNTER — Encounter: Payer: Self-pay | Admitting: Family Medicine

## 2012-10-13 VITALS — BP 118/70 | HR 78 | Temp 98.2°F | Resp 16 | Ht 70.0 in | Wt 291.0 lb

## 2012-10-13 DIAGNOSIS — I152 Hypertension secondary to endocrine disorders: Secondary | ICD-10-CM

## 2012-10-13 DIAGNOSIS — E1139 Type 2 diabetes mellitus with other diabetic ophthalmic complication: Secondary | ICD-10-CM

## 2012-10-13 DIAGNOSIS — Z6841 Body Mass Index (BMI) 40.0 and over, adult: Secondary | ICD-10-CM

## 2012-10-13 DIAGNOSIS — E291 Testicular hypofunction: Secondary | ICD-10-CM

## 2012-10-13 DIAGNOSIS — Z0000 Encounter for general adult medical examination without abnormal findings: Secondary | ICD-10-CM

## 2012-10-13 DIAGNOSIS — E1169 Type 2 diabetes mellitus with other specified complication: Secondary | ICD-10-CM

## 2012-10-13 DIAGNOSIS — E11319 Type 2 diabetes mellitus with unspecified diabetic retinopathy without macular edema: Secondary | ICD-10-CM

## 2012-10-13 DIAGNOSIS — E1159 Type 2 diabetes mellitus with other circulatory complications: Secondary | ICD-10-CM | POA: Insufficient documentation

## 2012-10-13 DIAGNOSIS — I70219 Atherosclerosis of native arteries of extremities with intermittent claudication, unspecified extremity: Secondary | ICD-10-CM

## 2012-10-13 DIAGNOSIS — I10 Essential (primary) hypertension: Secondary | ICD-10-CM

## 2012-10-13 DIAGNOSIS — N529 Male erectile dysfunction, unspecified: Secondary | ICD-10-CM

## 2012-10-13 LAB — POCT URINALYSIS DIPSTICK
Bilirubin, UA: NEGATIVE
Blood, UA: NEGATIVE
Ketones, UA: NEGATIVE
Spec Grav, UA: 1.015
pH, UA: 5

## 2012-10-13 NOTE — Progress Notes (Signed)
  Subjective:    Patient ID: Eduardo [name], male    DOB: Apr 03, 1956, 56 y.o.   MRN: [id number]  HPI He is here for complete examination. He recently quit smoking which I congratulated him on. He is being followed by Dr. Talmage Nap for his diabetes and hypogonadism. He has been on testosterone replacement for one month but has not noted any change in his energy, stamina or libido. He does have peripheral vascular disease and is being followed by the vascular surgeons. He does check his feet and gets regular eye exams. He is in the process of making dietary and exercise changes. His work schedule does interfere with this. He does travel a lot. Social and family history were reviewed. He does have a history of colonic polyps and will be scheduled for colonoscopy next year.   Review of Systems Negative except as above    Objective:   Physical Exam BP 118/70  Pulse 78  Temp 98.2 F (36.8 C) (Oral)  Resp 16  Ht 5\' 10"  (1.778 m)  Wt 291 lb (131.997 kg)  BMI 41.75 kg/m2  General Appearance:    Alert, cooperative, no distress, appears stated age  Head:    Normocephalic, without obvious abnormality, atraumatic  Eyes:    PERRL, conjunctiva/corneas clear, EOM's intact, fundi    benign  Ears:    Normal TM's and external ear canals  Nose:   Nares normal, mucosa normal, no drainage or sinus   tenderness  Throat:   Lips, mucosa, and tongue normal; teeth and gums normal  Neck:   Supple, no lymphadenopathy;  thyroid:  no   enlargement/tenderness/nodules; no carotid   bruit or JVD  Back:    Spine nontender, no curvature, ROM normal, no CVA     tenderness  Lungs:     Clear to auscultation bilaterally without wheezes, rales or     ronchi; respirations unlabored  Chest Wall:    No tenderness or deformity   Heart:    Regular rate and rhythm, S1 and S2 normal, no murmur, rub   or gallop  Breast Exam:    No chest wall tenderness, masses or gynecomastia  Abdomen:     Soft, non-tender, nondistended, normoactive  bowel sounds,    no masses, no hepatosplenomegaly  Genitalia:   deferred   Rectal:   deferred   Extremities:   No clubbing, cyanosis or edema  Pulses:   2+ and symmetric all extremities  Skin:   Skin color, texture, turgor normal, no rashes or lesions  Lymph nodes:   Cervical, supraclavicular, and axillary nodes normal  Neurologic:   CNII-XII intact, normal strength, sensation and gait; reflexes 2+ and symmetric throughout          Psych:   Normal mood, affect, hygiene and grooming.           Assessment & Plan:   1. Routine general medical examination at a health care facility  Urinalysis Dipstick  2. Morbid obesity with BMI of 40.0-44.9, adult    3. Erectile dysfunction    4. Diabetes mellitus with ophthalmic manifestations  HgB A1c  5. Atherosclerosis of native arteries of the extremities with intermittent claudication    6. Hypertension associated with diabetes    7. Hypogonadism male  Testosterone  8. Diabetic retinopathy     discussed the need for him to continue to make diet and exercise changes vessel in regard to his travel schedule area she will continue to be followed by Dr. Talmage Nap.

## 2012-10-20 ENCOUNTER — Encounter: Payer: Self-pay | Admitting: Family Medicine

## 2012-10-27 ENCOUNTER — Encounter: Payer: Self-pay | Admitting: Vascular Surgery

## 2012-10-28 ENCOUNTER — Encounter: Payer: Self-pay | Admitting: Neurosurgery

## 2012-10-28 ENCOUNTER — Encounter (INDEPENDENT_AMBULATORY_CARE_PROVIDER_SITE_OTHER): Payer: BC Managed Care – PPO | Admitting: *Deleted

## 2012-10-28 ENCOUNTER — Ambulatory Visit (INDEPENDENT_AMBULATORY_CARE_PROVIDER_SITE_OTHER): Payer: BC Managed Care – PPO | Admitting: Neurosurgery

## 2012-10-28 VITALS — BP 136/73 | HR 70 | Resp 18 | Ht 70.0 in | Wt 297.9 lb

## 2012-10-28 DIAGNOSIS — I739 Peripheral vascular disease, unspecified: Secondary | ICD-10-CM | POA: Insufficient documentation

## 2012-10-28 DIAGNOSIS — I70219 Atherosclerosis of native arteries of extremities with intermittent claudication, unspecified extremity: Secondary | ICD-10-CM

## 2012-10-28 HISTORY — DX: Peripheral vascular disease, unspecified: I73.9

## 2012-10-28 NOTE — Progress Notes (Signed)
[facility] [place] [facility] Office Note  CC: PAD surveillance Referring Physician: Edilia Bo  History of Present Illness: 56 year old male patient of Dr. Edilia Bo followed for right lower extremity claudication. The patient reports no worsening of his condition and as a matter of fact he thinks it is some better. He is in a walking program and is adamant he does not want any bypass intervention. The patient denies rest pain or open ulcerations on the lower extremities.  Past Medical History  Diagnosis Date  . Peritoneal abscess 6/08    and buttock.  . Diabetic retinopathy(362.0)   . Dupuytren contracture     R hand, s/p injection (Dr. Izora Ribas)  . Colon polyp   . Peripheral vascular disease   . Proteinuria   . Frequency of urination and polyuria   . Polydipsia   . Diabetes mellitus 1987    under care of Dr. Talmage Nap.  On insulin since 96 (off and on)  . Neuromuscular disorder     Diabetic neuropathy  . Hypertension   . Essential hypertension, benign   . Pure hyperglyceridemia   . Other testicular hypofunction     ROS: [x]  Positive   [ ]  Denies    General: [ ]  Weight loss, [ ]  Fever, [ ]  chills Neurologic: [ ]  Dizziness, [ ]  Blackouts, [ ]  Seizure [ ]  Stroke, [ ]  "Mini stroke", [ ]  Slurred speech, [ ]  Temporary blindness; [ ]  weakness in arms or legs, [ ]  Hoarseness Cardiac: [ ]  Chest pain/pressure, [ ]  Shortness of breath at rest [ ]  Shortness of breath with exertion, [ ]  Atrial fibrillation or irregular heartbeat Vascular: [ ]  Pain in legs with walking, [ ]  Pain in legs at rest, [ ]  Pain in legs at night,  [ ]  Non-healing ulcer, [ ]  Blood clot in vein/DVT,   Pulmonary: [ ]  Home oxygen, [ ]  Productive cough, [ ]  Coughing up blood, [ ]  Asthma,  [ ]  Wheezing Musculoskeletal:  [ ]  Arthritis, [ ]  Low back pain, [ ]  Joint pain Hematologic: [ ]  Easy Bruising, [ ]  Anemia; [ ]  Hepatitis Gastrointestinal: [ ]  Blood in stool, [ ]  Gastroesophageal Reflux/heartburn, [ ]   Trouble swallowing Urinary: [ ]  chronic Kidney disease, [ ]  on HD - [ ]  MWF or [ ]  TTHS, [ ]  Burning with urination, [ ]  Difficulty urinating Skin: [ ]  Rashes, [ ]  Wounds Psychological: [ ]  Anxiety, [ ]  Depression   Social History History  Substance Use Topics  . Smoking status: Former Smoker -- 1.0 packs/day for 30 years    Types: Cigarettes    Quit date: 01/08/2012  . Smokeless tobacco: Never Used  . Alcohol Use: No    Family History Family History  Problem Relation Age of Onset  . Diabetes Mother   . Hearing loss Mother   . Hypertension Mother     No Known Allergies  Current Outpatient Prescriptions  Medication Sig Dispense Refill  . ANDROGEL PUMP 20.25 MG/ACT (1.62%) GEL       . aspirin 81 MG tablet Take 81 mg by mouth daily.        . B-D UF III MINI PEN NEEDLES 31G X 5 MM MISC       . Bioflavonoid Products (ESTER-C) 1000-50 MG TABS Take 1 capsule by mouth daily.        Marland Kitchen diltiazem (TIAZAC) 120 MG 24 hr capsule Take 120 mg by mouth daily.      Marland Kitchen gemfibrozil (LOPID) 600 MG  tablet Take 600 mg by mouth 2 (two) times daily before a meal.        . Glucosamine HCl-MSM 750-750 MG TABS Take 2 capsules by mouth daily.       . insulin lispro protamine-insulin lispro (HUMALOG 75/25) (75-25) 100 UNIT/ML SUSP Inject 40 Units into the skin 2 (two) times daily with a meal. Takes 40 units in AM. Takes 40-50 units in PM.      . lisinopril (PRINIVIL,ZESTRIL) 40 MG tablet Take 40 mg by mouth daily.        . Multiple Vitamin (MULTIVITAMIN) tablet Take 1 tablet by mouth daily.      . Probiotic Product (PROBIOTIC PO) Take 1 capsule by mouth daily.      . sitaGLIPtan-metformin (JANUMET) 50-1000 MG per tablet Take 1 tablet by mouth 2 (two) times daily with a meal.        . diltiazem (CARDIZEM CD) 120 MG 24 hr capsule         Physical Examination  Filed Vitals:   10/28/12 1407  BP: 136/73  Pulse: 70  Resp: 18    Body mass index is 42.74 kg/(m^2).  General:  WDWN in NAD Gait:  Normal HEENT: WNL Eyes: Pupils equal Pulmonary: normal non-labored breathing , without Rales, rhonchi,  wheezing Cardiac: RRR, without  Murmurs, rubs or gallops; No carotid bruits Abdomen: soft, NT, no masses Skin: no rashes, ulcers noted Vascular Exam/Pulses: Palpable left lower extremity pulses, there is a right femoral pulse with no right lower extremity pulses palpable  Extremities without ischemic changes, no Gangrene , no cellulitis; no open wounds;  Musculoskeletal: no muscle wasting or atrophy  Neurologic: A&O X 3; Appropriate Affect ; SENSATION: normal; MOTOR FUNCTION:  moving all extremities equally. Speech is fluent/normal  Non-Invasive Vascular Imaging: ABIs are stable from 6 months ago, he is 0.59 and monophasic on the right 96 and triphasic on the left  ASSESSMENT/PLAN: Stable patient with chronic right lower extremity claudication, he does not want intervention. We did discuss Pletal which he does not want to start this time. Patient also mentions some mild edema in his lower extremities and I did discuss with him the possibility of incompetent valves in reflux but again he does not want any diagnostics done. He will try some over-the-counter compression. We will see the patient back in 6 months for repeat ABIs, his questions were encouraged and answered, he is in agreement with this plan.  Lauree Chandler ANP  Clinic M.D.: Imogene Burn on call

## 2012-10-31 NOTE — Addendum Note (Signed)
Addended by: Sharee Pimple on: 10/31/2012 02:21 PM   Modules accepted: Orders

## 2012-12-12 ENCOUNTER — Ambulatory Visit (INDEPENDENT_AMBULATORY_CARE_PROVIDER_SITE_OTHER): Payer: BC Managed Care – PPO | Admitting: Ophthalmology

## 2012-12-12 DIAGNOSIS — E1165 Type 2 diabetes mellitus with hyperglycemia: Secondary | ICD-10-CM

## 2012-12-12 DIAGNOSIS — H43819 Vitreous degeneration, unspecified eye: Secondary | ICD-10-CM

## 2012-12-12 DIAGNOSIS — E11359 Type 2 diabetes mellitus with proliferative diabetic retinopathy without macular edema: Secondary | ICD-10-CM

## 2012-12-12 DIAGNOSIS — E11319 Type 2 diabetes mellitus with unspecified diabetic retinopathy without macular edema: Secondary | ICD-10-CM

## 2012-12-12 DIAGNOSIS — H2510 Age-related nuclear cataract, unspecified eye: Secondary | ICD-10-CM

## 2012-12-12 DIAGNOSIS — H35039 Hypertensive retinopathy, unspecified eye: Secondary | ICD-10-CM

## 2012-12-12 DIAGNOSIS — I10 Essential (primary) hypertension: Secondary | ICD-10-CM

## 2012-12-24 ENCOUNTER — Other Ambulatory Visit: Payer: Self-pay

## 2013-05-03 ENCOUNTER — Ambulatory Visit: Payer: BC Managed Care – PPO | Admitting: Neurosurgery

## 2013-05-23 ENCOUNTER — Encounter: Payer: Self-pay | Admitting: Vascular Surgery

## 2013-05-24 ENCOUNTER — Encounter (INDEPENDENT_AMBULATORY_CARE_PROVIDER_SITE_OTHER): Payer: BC Managed Care – PPO | Admitting: *Deleted

## 2013-05-24 ENCOUNTER — Encounter: Payer: Self-pay | Admitting: Vascular Surgery

## 2013-05-24 ENCOUNTER — Ambulatory Visit (INDEPENDENT_AMBULATORY_CARE_PROVIDER_SITE_OTHER): Payer: BC Managed Care – PPO | Admitting: Vascular Surgery

## 2013-05-24 VITALS — BP 140/63 | HR 79 | Ht 70.0 in | Wt 303.8 lb

## 2013-05-24 DIAGNOSIS — I739 Peripheral vascular disease, unspecified: Secondary | ICD-10-CM

## 2013-05-24 NOTE — Progress Notes (Signed)
[facility] [place]  Patient name: Eduardo [name] MRN: [id number] DOB: [birth year] Sex: male  REASON FOR VISIT: follow up of right lower extremity claudication  HPI: Eduardo [name] is a 57 y.o. male who I have been following with right lower Street claudication. We had been considering femoropopliteal bypass grafting. Her we both favor a conservative approach. I last saw him a year ago. He was seen by our nurse practitioner in December of 2013. He states his symptoms have been gradually improving. He can now walk approximately 1 mouth before experiencing right calf claudication. His symptoms are brought on by ambulation and relieved with rest. He has no left lower extremity symptoms. He denies rest pain or nonhealing ulcers.  He quit tobacco in March of 2013.  Past Medical History  Diagnosis Date  . Peritoneal abscess 6/08    and buttock.  . Diabetic retinopathy   . Dupuytren contracture     R hand, s/p injection (Dr. Izora Ribas)  . Colon polyp   . Peripheral vascular disease   . Proteinuria   . Frequency of urination and polyuria   . Polydipsia   . Diabetes mellitus 1987    under care of Dr. Talmage Nap.  On insulin since 96 (off and on)  . Neuromuscular disorder     Diabetic neuropathy  . Hypertension   . Essential hypertension, benign   . Pure hyperglyceridemia   . Other testicular hypofunction   . Myocardial infarction     Family History  Problem Relation Age of Onset  . Diabetes Mother   . Hearing loss Mother   . Hypertension Mother   . Hyperlipidemia Mother   . Hyperlipidemia Brother     SOCIAL HISTORY: History  Substance Use Topics  . Smoking status: Former Smoker -- 1.00 packs/day for 30 years    Types: Cigarettes    Quit date: 01/08/2012  . Smokeless tobacco: Never Used  . Alcohol Use: No    No Known Allergies  Current Outpatient Prescriptions  Medication Sig Dispense Refill  . aspirin 81 MG tablet Take 81 mg by mouth daily.        . B-D UF III  MINI PEN NEEDLES 31G X 5 MM MISC       . Bioflavonoid Products (ESTER-C) 1000-50 MG TABS Take 1 capsule by mouth daily.        Marland Kitchen diltiazem (TIAZAC) 120 MG 24 hr capsule Take 120 mg by mouth daily.      Marland Kitchen gemfibrozil (LOPID) 600 MG tablet Take 600 mg by mouth 2 (two) times daily before a meal.        . insulin lispro protamine-insulin lispro (HUMALOG 75/25) (75-25) 100 UNIT/ML SUSP Inject 40 Units into the skin 2 (two) times daily with a meal. Takes 75 units in AM. Takes 60 units in PM.      . lisinopril (PRINIVIL,ZESTRIL) 40 MG tablet Take 40 mg by mouth daily.        . Multiple Vitamin (MULTIVITAMIN) tablet Take 1 tablet by mouth daily.      . Probiotic Product (PROBIOTIC PO) Take 1 capsule by mouth daily.      . ANDROGEL PUMP 20.25 MG/ACT (1.62%) GEL       . Glucosamine HCl-MSM 750-750 MG TABS Take 2 capsules by mouth daily.       . sitaGLIPtan-metformin (JANUMET) 50-1000 MG per tablet Take 1 tablet by mouth 2 (two) times daily with a meal.  No current facility-administered medications for this visit.    REVIEW OF SYSTEMS: Arly.Keller ] denotes positive finding; [  ] denotes negative finding  CARDIOVASCULAR:  [ ]  chest pain   [ ]  chest pressure   [ ]  palpitations   [ ]  orthopnea   [ ]  dyspnea on exertion   Arly.Keller ] claudication   [ ]  rest pain   [ ]  DVT   [ ]  phlebitis PULMONARY:   [ ]  productive cough   [ ]  asthma   [ ]  wheezing NEUROLOGIC:   [ ]  weakness  [ ]  paresthesias  [ ]  aphasia  [ ]  amaurosis  [ ]  dizziness HEMATOLOGIC:   [ ]  bleeding problems   [ ]  clotting disorders MUSCULOSKELETAL:  [ ]  joint pain   [ ]  joint swelling Arly.Keller ] leg swelling GASTROINTESTINAL: [ ]   blood in stool  [ ]   hematemesis GENITOURINARY:  [ ]   dysuria  [ ]   hematuria PSYCHIATRIC:  [ ]  history of major depression INTEGUMENTARY:  [ ]  rashes  Arly.Keller ] ulcers CONSTITUTIONAL:  [ ]  fever   [ ]  chills  PHYSICAL EXAM: Filed Vitals:   05/24/13 1558  BP: 140/63  Pulse: 79  Height: 5\' 10"  (1.778 m)  Weight: 303 lb 12.8 oz  (137.803 kg)  SpO2: 95%   Body mass index is 43.59 kg/(m^2). GENERAL: The patient is a well-nourished male, in no acute distress. The vital signs are documented above. CARDIOVASCULAR: There is a regular rate and rhythm. I did not detect carotid bruits. He has palpable femoral pulses and a palpable left popliteal pulse. PULMONARY: There is good air exchange bilaterally without wheezing or rales. ABDOMEN: Soft and non-tender with normal pitched bowel sounds.  MUSCULOSKELETAL: There are no major deformities or cyanosis. NEUROLOGIC: No focal weakness or paresthesias are detected. SKIN: There are no ulcers or rashes noted. PSYCHIATRIC: The patient has a normal affect.  DATA:  Have independently interpreted his arterial Doppler study. He has an ABI of 65% on the right which is improved from 59% in December. ABI on the left is 95%. He has monophasic Doppler signals in the right foot and biphasic Doppler signals in the left foot.  MEDICAL ISSUES: He has a known right superficial femoral artery occlusion. We have been treating this conservatively as he has stable claudication. His symptoms continue to improve. He is off cigarettes. He tries to walk as much as possible. He does travel a fair amount which requires him to walk. We will consider femoropopliteal bypass grafting if his symptoms progressed significantly or he developed rest pain or nonhealing ulcer. I'll see him back in one year. I've ordered follow up ABIs for that time. He knows to call sooner if he has problems.  Lamonte Hartt S [facility] [place] Beeper: [phone]

## 2013-05-30 NOTE — Addendum Note (Signed)
Addended by: Sharee Pimple on: 05/30/2013 09:34 AM   Modules accepted: Orders

## 2013-06-12 ENCOUNTER — Ambulatory Visit (INDEPENDENT_AMBULATORY_CARE_PROVIDER_SITE_OTHER): Payer: BC Managed Care – PPO | Admitting: Ophthalmology

## 2013-06-15 ENCOUNTER — Ambulatory Visit (INDEPENDENT_AMBULATORY_CARE_PROVIDER_SITE_OTHER): Payer: Self-pay | Admitting: Ophthalmology

## 2013-06-19 ENCOUNTER — Ambulatory Visit (INDEPENDENT_AMBULATORY_CARE_PROVIDER_SITE_OTHER): Payer: BC Managed Care – PPO | Admitting: Ophthalmology

## 2013-06-19 DIAGNOSIS — H2510 Age-related nuclear cataract, unspecified eye: Secondary | ICD-10-CM

## 2013-06-19 DIAGNOSIS — H35039 Hypertensive retinopathy, unspecified eye: Secondary | ICD-10-CM

## 2013-06-19 DIAGNOSIS — H43819 Vitreous degeneration, unspecified eye: Secondary | ICD-10-CM

## 2013-06-19 DIAGNOSIS — E11359 Type 2 diabetes mellitus with proliferative diabetic retinopathy without macular edema: Secondary | ICD-10-CM

## 2013-06-19 DIAGNOSIS — I10 Essential (primary) hypertension: Secondary | ICD-10-CM

## 2013-06-19 DIAGNOSIS — E11319 Type 2 diabetes mellitus with unspecified diabetic retinopathy without macular edema: Secondary | ICD-10-CM

## 2013-06-19 DIAGNOSIS — E1165 Type 2 diabetes mellitus with hyperglycemia: Secondary | ICD-10-CM

## 2013-07-17 ENCOUNTER — Encounter (INDEPENDENT_AMBULATORY_CARE_PROVIDER_SITE_OTHER): Payer: BC Managed Care – PPO | Admitting: Ophthalmology

## 2013-07-17 DIAGNOSIS — E11319 Type 2 diabetes mellitus with unspecified diabetic retinopathy without macular edema: Secondary | ICD-10-CM

## 2013-07-17 DIAGNOSIS — I10 Essential (primary) hypertension: Secondary | ICD-10-CM

## 2013-07-17 DIAGNOSIS — H35039 Hypertensive retinopathy, unspecified eye: Secondary | ICD-10-CM

## 2013-07-17 DIAGNOSIS — E1139 Type 2 diabetes mellitus with other diabetic ophthalmic complication: Secondary | ICD-10-CM

## 2013-09-14 ENCOUNTER — Other Ambulatory Visit: Payer: Self-pay

## 2014-01-15 ENCOUNTER — Ambulatory Visit (INDEPENDENT_AMBULATORY_CARE_PROVIDER_SITE_OTHER): Payer: BC Managed Care – PPO | Admitting: Ophthalmology

## 2014-02-01 ENCOUNTER — Ambulatory Visit (INDEPENDENT_AMBULATORY_CARE_PROVIDER_SITE_OTHER): Payer: BC Managed Care – PPO | Admitting: Ophthalmology

## 2014-02-01 DIAGNOSIS — E11319 Type 2 diabetes mellitus with unspecified diabetic retinopathy without macular edema: Secondary | ICD-10-CM

## 2014-02-01 DIAGNOSIS — E11359 Type 2 diabetes mellitus with proliferative diabetic retinopathy without macular edema: Secondary | ICD-10-CM

## 2014-02-01 DIAGNOSIS — I10 Essential (primary) hypertension: Secondary | ICD-10-CM

## 2014-02-01 DIAGNOSIS — E1139 Type 2 diabetes mellitus with other diabetic ophthalmic complication: Secondary | ICD-10-CM

## 2014-02-01 DIAGNOSIS — E1165 Type 2 diabetes mellitus with hyperglycemia: Secondary | ICD-10-CM

## 2014-02-01 DIAGNOSIS — H43819 Vitreous degeneration, unspecified eye: Secondary | ICD-10-CM

## 2014-02-01 DIAGNOSIS — H2510 Age-related nuclear cataract, unspecified eye: Secondary | ICD-10-CM

## 2014-02-01 DIAGNOSIS — H35039 Hypertensive retinopathy, unspecified eye: Secondary | ICD-10-CM

## 2014-05-29 ENCOUNTER — Encounter: Payer: Self-pay | Admitting: Vascular Surgery

## 2014-05-30 ENCOUNTER — Ambulatory Visit (INDEPENDENT_AMBULATORY_CARE_PROVIDER_SITE_OTHER): Payer: BC Managed Care – PPO | Admitting: Vascular Surgery

## 2014-05-30 ENCOUNTER — Ambulatory Visit: Payer: BC Managed Care – PPO | Admitting: Vascular Surgery

## 2014-05-30 ENCOUNTER — Encounter: Payer: Self-pay | Admitting: Vascular Surgery

## 2014-05-30 ENCOUNTER — Encounter (HOSPITAL_COMMUNITY): Payer: BC Managed Care – PPO

## 2014-05-30 ENCOUNTER — Ambulatory Visit (HOSPITAL_COMMUNITY)
Admission: RE | Admit: 2014-05-30 | Discharge: 2014-05-30 | Disposition: A | Discharge status: Home / Self Care | Payer: BC Managed Care – PPO | Source: Ambulatory Visit | Attending: Vascular Surgery | Admitting: Vascular Surgery

## 2014-05-30 ENCOUNTER — Ambulatory Visit: Payer: BC Managed Care – PPO | Admitting: Family

## 2014-05-30 VITALS — BP 124/79 | HR 72 | Ht 70.0 in | Wt 320.0 lb

## 2014-05-30 DIAGNOSIS — I70219 Atherosclerosis of native arteries of extremities with intermittent claudication, unspecified extremity: Secondary | ICD-10-CM

## 2014-05-30 DIAGNOSIS — I739 Peripheral vascular disease, unspecified: Secondary | ICD-10-CM

## 2014-05-30 NOTE — Progress Notes (Signed)
[facility] [place]  Patient name: Eduardo [name] MRN: [id number] DOB: 1955-11-14 Sex: male  REASON FOR VISIT: Follow up of peripheral vascular disease.  HPI: Eduardo [name] is a 58 y.o. male who I have been following with peripheral vascular disease. He was last seen a year ago on 05/24/2013. At that time, he had stable right lower extremity claudication and evidence of infrainguinal arterial occlusive disease on the right. At that time, his ABI on the right was 65% which actually improved some. ABI on the left was 95%. We elected to continue with conservative treatment. He comes in for a one year follow up visit.  Since I saw him last, he continues to have right calf claudication. However he states that he can walk further than before. He experiences pain in his right calf which is brought on by angulation and relieved with rest. He has no claudication the left side. He has no hip or thigh claudication. He denies any history of rest pain. He did have a toenail on his right third toe that was coming off and he pulled off and has a small wound on the tip of his right third toe. There is no significant erythema or drainage however.  Past Medical History  Diagnosis Date  . Peritoneal abscess 6/08    and buttock.  . Diabetic retinopathy   . Dupuytren contracture     R hand, s/p injection (Dr. Lenon Curt)  . Colon polyp   . Peripheral vascular disease   . Proteinuria   . Frequency of urination and polyuria   . Polydipsia   . Diabetes mellitus 1987    under care of Dr. Chalmers Cater.  On insulin since 96 (off and on)  . Neuromuscular disorder     Diabetic neuropathy  . Hypertension   . Essential hypertension, benign   . Pure hyperglyceridemia   . Other testicular hypofunction   . Myocardial infarction    Family History  Problem Relation Age of Onset  . Diabetes Mother   . Hearing loss Mother   . Hypertension Mother   . Hyperlipidemia Mother   . Heart disease Mother   .  Varicose Veins Mother   . Hyperlipidemia Brother   . Varicose Veins Father    SOCIAL HISTORY: History  Substance Use Topics  . Smoking status: Former Smoker -- 1.00 packs/day for 30 years    Types: Cigarettes    Quit date: 01/08/2012  . Smokeless tobacco: Never Used  . Alcohol Use: No   No Known Allergies  REVIEW OF SYSTEMS: Valu.Nieves ] denotes positive finding; [  ] denotes negative finding  CARDIOVASCULAR:  [ ]  chest pain   [ ]  chest pressure   [ ]  palpitations   [ ]  orthopnea   [ ]  dyspnea on exertion   Valu.Nieves ] claudication   [ ]  rest pain   [ ]  DVT   [ ]  phlebitis PULMONARY:   [ ]  productive cough   [ ]  asthma   [ ]  wheezing NEUROLOGIC:   [ ]  weakness  [ ]  paresthesias  [ ]  aphasia  [ ]  amaurosis  [ ]  dizziness HEMATOLOGIC:   [ ]  bleeding problems   [ ]  clotting disorders MUSCULOSKELETAL:  [ ]  joint pain   [ ]  joint swelling Valu.Nieves ] leg swelling GASTROINTESTINAL: [ ]   blood in stool  [ ]   hematemesis GENITOURINARY:  [ ]   dysuria  [ ]   hematuria PSYCHIATRIC:  [ ]  history of  major depression INTEGUMENTARY:  [ ]  rashes  [ ]  ulcers CONSTITUTIONAL:  [ ]  fever   [ ]  chills  PHYSICAL EXAM: Filed Vitals:   05/30/14 1345  BP: 124/79  Pulse: 72  Height: 5\' 10"  (1.778 m)  Weight: 320 lb (145.151 kg)  SpO2: 96%   Body mass index is 45.92 kg/(m^2). GENERAL: The patient is a well-nourished male, in no acute distress. The vital signs are documented above. CARDIOVASCULAR: There is a regular rate and rhythm. I do not detect carotid bruits. He has palpable femoral pulses. I cannot palpate pedal pulses. He does have a left popliteal pulse. Both feet are warm and well-perfused. PULMONARY: There is good air exchange bilaterally without wheezing or rales. ABDOMEN: Soft and non-tender with normal pitched bowel sounds.  MUSCULOSKELETAL: There are no major deformities or cyanosis. NEUROLOGIC: No focal weakness or paresthesias are detected. SKIN: There are no ulcers or rashes noted. PSYCHIATRIC: The  patient has a normal affect.  DATA:  ARTERIAL DOPPLER: I have independently interpreted his arterial Doppler study today which shows an ABI of 71% on the right. This is slightly improved compared to one year ago. He has monophasic Doppler signals in the dorsalis pedis and posterior tibial positions on the right with a digital pressure of 66 mmHg. On the left side, ABI is 100% with biphasic Doppler signals in the dorsalis pedis and posterior tibial positions a toe pressure of 128 mmHg.  MEDICAL ISSUES:  Peripheral vascular disease, unspecified This patient has stable right lower extremity claudication secondary to infrainguinal arterial occlusive disease. His symptoms are gradually improving and his ABIs have actually improved. He is not a smoker. I've encouraged him to stay as active as possible. At this point I think it is safe to extend his follow up out to 2 years. I have ordered follow up ABIs in 2 years. I will see him back at that time. He knows to call sooner if he has problems.    Return in about 2 years (around 05/30/2016).  [place] [facility] [place] Beeper: [phone]

## 2014-05-30 NOTE — Assessment & Plan Note (Signed)
This patient has stable right lower extremity claudication secondary to infrainguinal arterial occlusive disease. His symptoms are gradually improving and his ABIs have actually improved. He is not a smoker. I've encouraged him to stay as active as possible. At this point I think it is safe to extend his follow up out to 2 years. I have ordered follow up ABIs in 2 years. I will see him back at that time. He knows to call sooner if he has problems.

## 2014-05-30 NOTE — Addendum Note (Signed)
Addended by: Mena Goes on: 05/30/2014 04:35 PM   Modules accepted: Orders

## 2014-07-09 ENCOUNTER — Encounter (INDEPENDENT_AMBULATORY_CARE_PROVIDER_SITE_OTHER): Payer: Self-pay

## 2014-07-09 ENCOUNTER — Ambulatory Visit (INDEPENDENT_AMBULATORY_CARE_PROVIDER_SITE_OTHER): Payer: BC Managed Care – PPO | Admitting: Ophthalmology

## 2014-07-09 DIAGNOSIS — I10 Essential (primary) hypertension: Secondary | ICD-10-CM

## 2014-07-09 DIAGNOSIS — H2510 Age-related nuclear cataract, unspecified eye: Secondary | ICD-10-CM

## 2014-07-09 DIAGNOSIS — E11319 Type 2 diabetes mellitus with unspecified diabetic retinopathy without macular edema: Secondary | ICD-10-CM

## 2014-07-09 DIAGNOSIS — E11359 Type 2 diabetes mellitus with proliferative diabetic retinopathy without macular edema: Secondary | ICD-10-CM

## 2014-07-09 DIAGNOSIS — E1165 Type 2 diabetes mellitus with hyperglycemia: Secondary | ICD-10-CM

## 2014-07-09 DIAGNOSIS — H35039 Hypertensive retinopathy, unspecified eye: Secondary | ICD-10-CM

## 2014-07-09 DIAGNOSIS — H43819 Vitreous degeneration, unspecified eye: Secondary | ICD-10-CM

## 2014-07-09 DIAGNOSIS — E1139 Type 2 diabetes mellitus with other diabetic ophthalmic complication: Secondary | ICD-10-CM

## 2014-07-20 ENCOUNTER — Encounter: Payer: Self-pay | Admitting: Podiatry

## 2014-07-20 ENCOUNTER — Ambulatory Visit (INDEPENDENT_AMBULATORY_CARE_PROVIDER_SITE_OTHER): Payer: BC Managed Care – PPO

## 2014-07-20 ENCOUNTER — Ambulatory Visit (INDEPENDENT_AMBULATORY_CARE_PROVIDER_SITE_OTHER): Payer: BC Managed Care – PPO | Admitting: Podiatry

## 2014-07-20 DIAGNOSIS — L600 Ingrowing nail: Secondary | ICD-10-CM

## 2014-07-20 DIAGNOSIS — R52 Pain, unspecified: Secondary | ICD-10-CM

## 2014-07-20 DIAGNOSIS — L97509 Non-pressure chronic ulcer of other part of unspecified foot with unspecified severity: Secondary | ICD-10-CM

## 2014-07-20 NOTE — Patient Instructions (Signed)
Antibiotic ointment/band-aid to right toe wound Monitor for any signs/symptoms of infection. Call the office immediately if any occur or go directly to the emergency room. Call with any questions/concerns.

## 2014-07-20 NOTE — Progress Notes (Signed)
Subjective:    Patient ID: Eduardo [name], male    DOB: September 17, 1956, 58 y.o.   MRN: [id number]  HPI Eduardo [name], 58 year old male, presents the office today for diabetic risk assessment and pain in his right third digit. Also states that he has had pain in his right hallux ingrown nail site. States his blood sugar ranges between 115 to 130 in the morning. States that his last HbA1c was either 9 or 10. He denies any history of ulceration. For the right third digit he states the nail previously had fallen off. He over the last couple weeks has had some drainage increased pain to the area. He previously states that he noticed some drainage/purulence from the site but currently denies any. He was seen at an urgent care and was given an antibiotic. Also states that he follows up with vascular surgery for blockages in his right leg however at this time he does not want any surgical intervention. For this, he has been increasing his exercise (walking).  Also states that he has ongoing neuropathy. He travels a great bit and after today's appointment he'll not be back in the area until around Thanksgiving. He denies any systemic complaints such as fevers, chills, nausea, vomiting. No other complaints at this time.    Review of Systems  All other systems reviewed and are negative.      Objective:   Physical Exam AAO x3, NAD Decreased DP and PT pulses. CRT < 3 sec Protective sensation decreased with Simms Weinstein monofilament, vibratory sensation decreased. Right third digit there is an area of hyperkeratotic tissue over the distal medial aspect of the nail with a slightly elongated nail overlying this area. Once the nail and the hyperkeratotic tissue is debrided there is a small underlying superficial granular ulceration measuring approximately 4 x 2 mm. Is no probing. No surrounding erythema or any ascending cellulitis. No fluctuance or crepitus. No underlying purulence. Left hallux both medial and lateral  borders have evidence of removal of the borders of the patient. There is dried blood in the area. The majority of the nail borders have been removed. No surrounding erythema, drainage, clinical signs of infection. No other lesions identified. MMT 5/5, ROM WNL No pain with calf compression.        Assessment & Plan:  58 year old male right third digit superficial, noninfected, ulceration, left hallux bilateral border history of ingrown toenails. -Surgical versus conservative treatment were discussed with the patient including alternatives, risks, complications. -The right foot continued antibiotic dressing changes daily. Monitor for any signs or symptoms of infection and directed to call the office immediately if any are to occur. Also discussed that this wound does not heal in the appropriate manner time he needs to call his vascular surgeon for further evaluation. -For the left hallux ingrown toenails the patient has recently removed in borders himself. There is evidence of fresh dried blood over the area. I discussed with the patient at great length that he should not be doing this and that he has a great risk for infection and nonhealing wounds. Discussed with him that if the nails continue to be an issue we can possibly remove the nail borders of the office. However at this time as they are not clinically infected and as he has already removed the majority of the nail border and given his HbA1c and vascular status we will hold off on this at this time. Also as the patient travels quite a bit and he'll  be leaving town until November it would not be able to followup. He states that he does not want to have any issues while he was traveling is well it would like to hold off on the procedure. -He is unable to followup due to work however discussed that if he has any questions or concerns he should call the office immediately or go directly to the emergency room. Followup next time he is in town.

## 2014-10-18 ENCOUNTER — Encounter (HOSPITAL_COMMUNITY): Payer: Self-pay | Admitting: Vascular Surgery

## 2014-11-22 LAB — HM COLONOSCOPY

## 2014-12-20 ENCOUNTER — Encounter: Payer: Self-pay | Admitting: *Deleted

## 2015-01-21 ENCOUNTER — Ambulatory Visit (INDEPENDENT_AMBULATORY_CARE_PROVIDER_SITE_OTHER): Payer: BC Managed Care – PPO | Admitting: Ophthalmology

## 2015-01-28 ENCOUNTER — Ambulatory Visit (INDEPENDENT_AMBULATORY_CARE_PROVIDER_SITE_OTHER): Payer: BLUE CROSS/BLUE SHIELD | Admitting: Ophthalmology

## 2015-01-28 DIAGNOSIS — H43813 Vitreous degeneration, bilateral: Secondary | ICD-10-CM | POA: Diagnosis not present

## 2015-01-28 DIAGNOSIS — E11319 Type 2 diabetes mellitus with unspecified diabetic retinopathy without macular edema: Secondary | ICD-10-CM | POA: Diagnosis not present

## 2015-01-28 DIAGNOSIS — E11339 Type 2 diabetes mellitus with moderate nonproliferative diabetic retinopathy without macular edema: Secondary | ICD-10-CM | POA: Diagnosis not present

## 2015-01-28 DIAGNOSIS — H2513 Age-related nuclear cataract, bilateral: Secondary | ICD-10-CM

## 2015-01-28 DIAGNOSIS — E11359 Type 2 diabetes mellitus with proliferative diabetic retinopathy without macular edema: Secondary | ICD-10-CM

## 2015-01-28 DIAGNOSIS — I10 Essential (primary) hypertension: Secondary | ICD-10-CM

## 2015-01-28 DIAGNOSIS — H35033 Hypertensive retinopathy, bilateral: Secondary | ICD-10-CM

## 2015-08-05 ENCOUNTER — Ambulatory Visit (INDEPENDENT_AMBULATORY_CARE_PROVIDER_SITE_OTHER): Payer: BLUE CROSS/BLUE SHIELD | Admitting: Ophthalmology

## 2015-08-12 ENCOUNTER — Ambulatory Visit (INDEPENDENT_AMBULATORY_CARE_PROVIDER_SITE_OTHER): Payer: BLUE CROSS/BLUE SHIELD | Admitting: Ophthalmology

## 2015-08-28 ENCOUNTER — Ambulatory Visit (INDEPENDENT_AMBULATORY_CARE_PROVIDER_SITE_OTHER): Payer: BLUE CROSS/BLUE SHIELD | Admitting: Ophthalmology

## 2015-08-28 DIAGNOSIS — H35033 Hypertensive retinopathy, bilateral: Secondary | ICD-10-CM

## 2015-08-28 DIAGNOSIS — E113391 Type 2 diabetes mellitus with moderate nonproliferative diabetic retinopathy without macular edema, right eye: Secondary | ICD-10-CM

## 2015-08-28 DIAGNOSIS — I10 Essential (primary) hypertension: Secondary | ICD-10-CM | POA: Diagnosis not present

## 2015-08-28 DIAGNOSIS — H43813 Vitreous degeneration, bilateral: Secondary | ICD-10-CM

## 2015-08-28 DIAGNOSIS — E11319 Type 2 diabetes mellitus with unspecified diabetic retinopathy without macular edema: Secondary | ICD-10-CM | POA: Diagnosis not present

## 2015-08-28 DIAGNOSIS — E113592 Type 2 diabetes mellitus with proliferative diabetic retinopathy without macular edema, left eye: Secondary | ICD-10-CM | POA: Diagnosis not present

## 2015-09-04 ENCOUNTER — Ambulatory Visit (INDEPENDENT_AMBULATORY_CARE_PROVIDER_SITE_OTHER): Payer: BLUE CROSS/BLUE SHIELD | Admitting: Ophthalmology

## 2015-12-04 ENCOUNTER — Ambulatory Visit (INDEPENDENT_AMBULATORY_CARE_PROVIDER_SITE_OTHER): Payer: BLUE CROSS/BLUE SHIELD | Admitting: Podiatry

## 2015-12-04 ENCOUNTER — Encounter: Payer: Self-pay | Admitting: Podiatry

## 2015-12-04 VITALS — BP 147/77 | HR 84 | Resp 16

## 2015-12-04 DIAGNOSIS — E1140 Type 2 diabetes mellitus with diabetic neuropathy, unspecified: Secondary | ICD-10-CM | POA: Diagnosis not present

## 2015-12-04 DIAGNOSIS — L03031 Cellulitis of right toe: Secondary | ICD-10-CM | POA: Diagnosis not present

## 2015-12-04 MED ORDER — CEPHALEXIN 500 MG PO CAPS: 500.0000 mg | ORAL_CAPSULE | Freq: Four times a day (QID) | ORAL | Status: DC

## 2015-12-04 NOTE — Progress Notes (Addendum)
Subjective:     Patient ID: Eduardo [name], male   DOB: 1955/12/07, 60 y.o.   MRN: [id number]  HPI this patient presents to the office with chief complaint of a painful second toe, right foot. He says that his toenail fell off approximately 10 days ago. The toe has now become painful with occasional pain walking wearing his shoes. He is a diabetic who does well with his sugars except during busy times this patient admits that he is diabetic with neuropathy. Patient has newly become IDDM patient. He presents the office today for an evaluation and treatment of this condition   Review of Systems     Objective:   Physical Exam GENERAL APPEARANCE: Alert, conversant. Appropriately groomed. No acute distress.  VASCULAR: Pedal pulses palpable at  [facility] and PT bilateral.  Capillary refill time is immediate to all digits,  Normal temperature gradient.    NEUROLOGIC: sensation is normal to 5.07 monofilament at 5/5 sites bilateral except his toes are diminished  Light touch is intact bilateral, Muscle strength normal.  MUSCULOSKELETAL: acceptable muscle strength, tone and stability bilateral.  Intrinsic muscluature intact bilateral.  Rectus appearance of foot and digits noted bilateral.   DERMATOLOGIC: skin color, texture, and turgor are within normal limits.  No preulcerative lesions or ulcers  are seen, no interdigital maceration noted.  No open lesions present. . No drainage noted.  Nails  His second toenail right foot is avulsed with redness and swelling around the nail area.  No drainage noted.      Assessment:     Paronychia right foot.      Plan:     ROV  Diabetic  foot exam performed.  Prescribed cephalexin 500 mg.  # 28.  DSD applied.  RTC 3 months for nail care.  If this condition worsens or becomes very painful, the patient was told to contact this office or go to the Emergency Department at the hospital.  Gardiner Barefoot DPM

## 2016-02-26 ENCOUNTER — Ambulatory Visit (INDEPENDENT_AMBULATORY_CARE_PROVIDER_SITE_OTHER): Payer: BLUE CROSS/BLUE SHIELD | Admitting: Podiatry

## 2016-02-26 DIAGNOSIS — M79676 Pain in unspecified toe(s): Secondary | ICD-10-CM | POA: Diagnosis not present

## 2016-02-26 DIAGNOSIS — E1140 Type 2 diabetes mellitus with diabetic neuropathy, unspecified: Secondary | ICD-10-CM | POA: Diagnosis not present

## 2016-02-26 DIAGNOSIS — B351 Tinea unguium: Secondary | ICD-10-CM

## 2016-02-26 NOTE — Progress Notes (Signed)
Subjective:     Patient ID: Eduardo [name], male   DOB: 11-May-1956, 60 y.o.   MRN: [id number]  HPI this patient returns to the office for diabetic footcare on his feet. He was seen initially by myself in January 2017 and diagnosed with a paronychia of the second toenail right foot. I debrided the nail and prescribed cephalexin. #28 and told him to soak at home.  If he was to have a problem,. He was to go the emergency department at the hospital. He says that his toe healed up nicely and not having any pain or discomfort. He presents the office today for preventative foot care services being a diabetic   Review of Systems     Objective:   Physical Exam GENERAL APPEARANCE: Alert, conversant. Appropriately groomed. No acute distress.  VASCULAR: Pedal pulses are  palpable at  DP and PT bilateral.  Capillary refill time is immediate to all digits,  Cold feet noted  NEUROLOGIC: sensation is diminished  to 5.07 monofilament at 5/5 sites bilateral.  Light touch is intact bilateral, Muscle strength normal.  MUSCULOSKELETAL: acceptable muscle strength, tone and stability bilateral.  Intrinsic muscluature intact bilateral.  Rectus appearance of foot and digits noted bilateral.   DERMATOLOGIC: skin color, texture, and turgor are within normal limits.  No preulcerative lesions or ulcers  are seen, no interdigital maceration noted.  No open lesions present.  . No drainage noted. NAILS  Thick disfigured ingrown toenails great toes both feet.  Patient has healing to the nail bed second toe right foot.      Assessment:   Onychomycosis  Healing infection second toe right foot.    Plan:     Debridement of nails B/L  RTC 3 months   Gardiner Barefoot DPM

## 2016-03-09 ENCOUNTER — Ambulatory Visit (INDEPENDENT_AMBULATORY_CARE_PROVIDER_SITE_OTHER): Payer: BLUE CROSS/BLUE SHIELD | Admitting: Ophthalmology

## 2016-03-11 ENCOUNTER — Ambulatory Visit (INDEPENDENT_AMBULATORY_CARE_PROVIDER_SITE_OTHER): Payer: BLUE CROSS/BLUE SHIELD | Admitting: Ophthalmology

## 2016-03-11 DIAGNOSIS — E11319 Type 2 diabetes mellitus with unspecified diabetic retinopathy without macular edema: Secondary | ICD-10-CM | POA: Diagnosis not present

## 2016-03-11 DIAGNOSIS — E113592 Type 2 diabetes mellitus with proliferative diabetic retinopathy without macular edema, left eye: Secondary | ICD-10-CM | POA: Diagnosis not present

## 2016-03-11 DIAGNOSIS — H35033 Hypertensive retinopathy, bilateral: Secondary | ICD-10-CM

## 2016-03-11 DIAGNOSIS — E113391 Type 2 diabetes mellitus with moderate nonproliferative diabetic retinopathy without macular edema, right eye: Secondary | ICD-10-CM | POA: Diagnosis not present

## 2016-03-11 DIAGNOSIS — H43813 Vitreous degeneration, bilateral: Secondary | ICD-10-CM

## 2016-03-11 DIAGNOSIS — I10 Essential (primary) hypertension: Secondary | ICD-10-CM

## 2016-05-20 ENCOUNTER — Ambulatory Visit (INDEPENDENT_AMBULATORY_CARE_PROVIDER_SITE_OTHER): Payer: BLUE CROSS/BLUE SHIELD | Admitting: Podiatry

## 2016-05-20 ENCOUNTER — Encounter: Payer: Self-pay | Admitting: Podiatry

## 2016-05-20 DIAGNOSIS — B351 Tinea unguium: Secondary | ICD-10-CM | POA: Diagnosis not present

## 2016-05-20 DIAGNOSIS — E1140 Type 2 diabetes mellitus with diabetic neuropathy, unspecified: Secondary | ICD-10-CM

## 2016-05-20 DIAGNOSIS — M79676 Pain in unspecified toe(s): Secondary | ICD-10-CM

## 2016-05-20 NOTE — Progress Notes (Signed)
Patient ID: Eduardo [name], male   DOB: 1956/07/12, 60 y.o.   MRN: [id number] Complaint:  Visit Type: Patient returns to my office for continued preventative foot care services. Complaint: Patient states" my nails have grown long and thick and become painful to walk and wear shoes" Patient has been diagnosed with DM with no foot complications. The patient presents for preventative foot care services. No changes to ROS  Podiatric Exam: Vascular: dorsalis pedis and posterior tibial pulses are palpable bilateral. Capillary return is immediate. Temperature gradient is WNL. Skin turgor WNL  Sensorium: Abnormal  Semmes Weinstein monofilament test. Normal tactile sensation bilaterally. Nail Exam: Pt has thick disfigured discolored nails with subungual debris noted bilateral entire nail hallux  Ulcer Exam: There is no evidence of ulcer or pre-ulcerative changes or infection. Orthopedic Exam: Muscle tone and strength are WNL. No limitations in general ROM. No crepitus or effusions noted. Foot type and digits show no abnormalities. Bony prominences are unremarkable. Skin: No Porokeratosis. No infection or ulcers  Diagnosis:  Onychomycosis, , Pain in right toe, pain in left toes  Treatment & Plan Procedures and Treatment: Consent by patient was obtained for treatment procedures. The patient understood the discussion of treatment and procedures well. All questions were answered thoroughly reviewed. Debridement of mycotic and hypertrophic toenails, 1 through 5 bilateral and clearing of subungual debris. No ulceration, no infection noted.  Return Visit-Office Procedure: Patient instructed to return to the office for a follow up visit 3 months for continued evaluation and treatment.    Gardiner Barefoot DPM

## 2016-06-03 ENCOUNTER — Encounter (HOSPITAL_COMMUNITY): Payer: Self-pay

## 2016-06-03 ENCOUNTER — Ambulatory Visit: Payer: BC Managed Care – PPO | Admitting: Vascular Surgery

## 2016-06-05 ENCOUNTER — Encounter: Payer: Self-pay | Admitting: Vascular Surgery

## 2016-06-10 ENCOUNTER — Encounter: Payer: Self-pay | Admitting: Vascular Surgery

## 2016-06-10 ENCOUNTER — Ambulatory Visit (INDEPENDENT_AMBULATORY_CARE_PROVIDER_SITE_OTHER): Payer: BLUE CROSS/BLUE SHIELD | Admitting: Vascular Surgery

## 2016-06-10 ENCOUNTER — Ambulatory Visit (HOSPITAL_COMMUNITY)
Admission: RE | Admit: 2016-06-10 | Discharge: 2016-06-10 | Disposition: A | Discharge status: Home / Self Care | Payer: BLUE CROSS/BLUE SHIELD | Source: Ambulatory Visit | Attending: Vascular Surgery | Admitting: Vascular Surgery

## 2016-06-10 ENCOUNTER — Other Ambulatory Visit: Payer: Self-pay | Admitting: Vascular Surgery

## 2016-06-10 VITALS — BP 134/80 | HR 79 | Temp 98.7°F | Resp 24 | Ht 70.0 in | Wt 333.0 lb

## 2016-06-10 DIAGNOSIS — I70211 Atherosclerosis of native arteries of extremities with intermittent claudication, right leg: Secondary | ICD-10-CM

## 2016-06-10 DIAGNOSIS — E11319 Type 2 diabetes mellitus with unspecified diabetic retinopathy without macular edema: Secondary | ICD-10-CM | POA: Insufficient documentation

## 2016-06-10 DIAGNOSIS — R938 Abnormal findings on diagnostic imaging of other specified body structures: Secondary | ICD-10-CM | POA: Insufficient documentation

## 2016-06-10 DIAGNOSIS — I70219 Atherosclerosis of native arteries of extremities with intermittent claudication, unspecified extremity: Secondary | ICD-10-CM

## 2016-06-10 DIAGNOSIS — I10 Essential (primary) hypertension: Secondary | ICD-10-CM | POA: Diagnosis not present

## 2016-06-10 DIAGNOSIS — I739 Peripheral vascular disease, unspecified: Secondary | ICD-10-CM

## 2016-06-10 DIAGNOSIS — E1140 Type 2 diabetes mellitus with diabetic neuropathy, unspecified: Secondary | ICD-10-CM | POA: Diagnosis not present

## 2016-06-10 NOTE — Progress Notes (Signed)
Vascular and Vein Specialist of   Patient name: Eduardo [name] MRN: [id number] DOB: 1956-01-09 Sex: male  REASON FOR VISIT: Follow up of peripheral vascular disease  HPI: Eduardo [name] is a 60 y.o. male who I last saw on 05/30/2014. I have been following him with peripheral vascular disease. He had stable claudication. At the time of his last visit, 2 years ago, ABI on the right was 71%. ABI on the left was 100%. I felt that the patient had stable claudication secondary to right lower extremity infrainguinal arterial occlusive disease. He was not a smoker. I encouraged him to stay as active as possible. He was set up for a 2 year follow up visit.  Since I saw him last, he does occasionally get claudication in his right calf which is brought on by ambulation and relieved with rest. He has no significant symptoms on the left side. He occasionally gets some hip pain on both sides but this sounds like pain from arthritis. He denies any history of rest pain. He does have neuropathy in his feet secondary to his diabetes.  Unfortunately, he lost his wife since I saw him last.  He is not a smoker.   Past Medical History:  Diagnosis Date  . Colon polyp   . Diabetes mellitus 1987   under care of Dr. Chalmers Cater.  On insulin since 96 (off and on)  . Diabetic retinopathy   . Dupuytren contracture    R hand, s/p injection (Dr. Lenon Curt)  . Essential hypertension, benign   . Frequency of urination and polyuria   . Hypertension   . Myocardial infarction ([place])   . Neuromuscular disorder ([place])    Diabetic neuropathy  . Other testicular hypofunction   . Peripheral vascular disease ([place])   . Peritoneal abscess ([place]) 6/08   and buttock.  . Polydipsia   . Proteinuria   . Pure hyperglyceridemia     Family History  Problem Relation Age of Onset  . Diabetes Mother   . Hearing loss Mother   . Hypertension Mother   . Hyperlipidemia Mother   . Heart disease Mother   . Varicose Veins Mother   .  Hyperlipidemia Brother   . Varicose Veins Father     SOCIAL HISTORY: Social History  Substance Use Topics  . Smoking status: Former Smoker    Packs/day: 1.00    Years: 30.00    Types: Cigarettes    Quit date: 01/08/2012  . Smokeless tobacco: Never Used  . Alcohol use No    No Known Allergies  Current Outpatient Prescriptions  Medication Sig Dispense Refill  . aspirin 81 MG tablet Take 81 mg by mouth daily.      . B-D UF III MINI PEN NEEDLES 31G X 5 MM MISC     . Bioflavonoid Products (ESTER-C) 1000-50 MG TABS Take 1 capsule by mouth daily.      Marland Kitchen CARTIA XT 120 MG 24 hr capsule     . cephALEXin (KEFLEX) 500 MG capsule Take 1 capsule (500 mg total) by mouth 4 (four) times daily. 28 capsule 0  . diltiazem (TIAZAC) 120 MG 24 hr capsule Take 120 mg by mouth daily.    Marland Kitchen gemfibrozil (LOPID) 600 MG tablet Take 600 mg by mouth 2 (two) times daily before a meal.      . Glucosamine HCl-MSM 750-750 MG TABS Take 2 capsules by mouth daily.     . insulin lispro protamine-insulin lispro (HUMALOG 75/25) (75-25) 100 UNIT/ML SUSP  Inject 40 Units into the skin 2 (two) times daily with a meal. Takes 75 units in AM. Takes 60 units in PM.    . lisinopril (PRINIVIL,ZESTRIL) 40 MG tablet Take 40 mg by mouth daily.      . Multiple Vitamin (MULTIVITAMIN) tablet Take 1 tablet by mouth daily.    . Probiotic Product (PROBIOTIC PO) Take 1 capsule by mouth daily.    . sitaGLIPtan-metformin (JANUMET) 50-1000 MG per tablet Take 1 tablet by mouth 2 (two) times daily with a meal.      . ANDROGEL PUMP 20.25 MG/ACT (1.62%) GEL     . cephALEXin (KEFLEX) 500 MG capsule Take 1 capsule (500 mg total) by mouth 4 (four) times daily. (Patient not taking: Reported on 06/10/2016) 28 capsule 0   No current facility-administered medications for this visit.     REVIEW OF SYSTEMS:  [X]  denotes positive finding, [ ]  denotes negative finding Cardiac  Comments:  Chest pain or chest pressure:    Shortness of breath upon exertion:     Short of breath when lying flat:    Irregular heart rhythm:        Vascular    Pain in calf, thigh, or hip brought on by ambulation: X Right calf  Pain in feet at night that wakes you up from your sleep:     Blood clot in your veins:    Leg swelling:         Pulmonary    Oxygen at home:    Productive cough:     Wheezing:         Neurologic    Sudden weakness in arms or legs:     Sudden numbness in arms or legs:     Sudden onset of difficulty speaking or slurred speech:    Temporary loss of vision in one eye:     Problems with dizziness:         Gastrointestinal    Blood in stool:     Vomited blood:         Genitourinary    Burning when urinating:     Blood in urine:        Psychiatric    Major depression:         Hematologic    Bleeding problems:    Problems with blood clotting too easily:        Skin    Rashes or ulcers:        Constitutional    Fever or chills:      PHYSICAL EXAM: Vitals:   06/10/16 1102  BP: 134/80  Pulse: 79  Resp: (!) 24  Temp: 98.7 F (37.1 C)  TempSrc: Oral  SpO2: 95%  Weight: (!) 333 lb (151 kg)  Height: 5\' 10"  (1.778 m)    GENERAL: The patient is a well-nourished male, in no acute distress. The vital signs are documented above. CARDIAC: There is a regular rate and rhythm.  VASCULAR: I do not detect carotid bruits. Because of his size, it is difficult to palpate his femoral pulses. He does have a palpable left popliteal pulse. Both feet are warm and well-perfused. I cannot palpate pedal pulses. He has mild bilateral lower extremity swelling. PULMONARY: There is good air exchange bilaterally without wheezing or rales. ABDOMEN: Soft and non-tender with normal pitched bowel sounds.  MUSCULOSKELETAL: There are no major deformities or cyanosis. NEUROLOGIC: No focal weakness or paresthesias are detected. SKIN: There are no ulcers or rashes noted. PSYCHIATRIC: The  patient has a normal affect.  DATA:   LOWER EXTREMITY ARTERIAL  DOPPLER STUDY: I have independently interpreted his lower extremity arterial Doppler study today.  On the right side he has an ABI of 72% which has not changed over the last 2 years. He has monophasic Doppler signals in the dorsalis pedis and posterior tibial positions on the right.  On the left side, he has an ABI of 100%. He has biphasic Doppler signals in the dorsalis pedis and posterior tibial positions on the left.  MEDICAL ISSUES:  RIGHT LOWER EXTREMITY CLAUDICATION: This patient has stable claudication of the right lower extremity. 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 Fortunately he is not a smoker. I have encouraged him to stay as active as possible. He is on aspirin. He is not on a statin but is on Lopid. He would like to continue his routine follow up visits for now and therefore I have ordered follow up ABIs in 2 years and I'll see him back at that time. He knows to call sooner if he has problems.    Deitra Mayo [facility] [place] [phone]

## 2016-07-29 ENCOUNTER — Encounter: Payer: Self-pay | Admitting: Podiatry

## 2016-07-29 ENCOUNTER — Ambulatory Visit (INDEPENDENT_AMBULATORY_CARE_PROVIDER_SITE_OTHER): Payer: BLUE CROSS/BLUE SHIELD | Admitting: Podiatry

## 2016-07-29 VITALS — BP 140/76 | HR 90 | Resp 14

## 2016-07-29 DIAGNOSIS — M79676 Pain in unspecified toe(s): Secondary | ICD-10-CM

## 2016-07-29 DIAGNOSIS — B351 Tinea unguium: Secondary | ICD-10-CM

## 2016-07-29 DIAGNOSIS — E1140 Type 2 diabetes mellitus with diabetic neuropathy, unspecified: Secondary | ICD-10-CM

## 2016-07-29 NOTE — Progress Notes (Signed)
Patient ID: JERSEY ROMEL, male   DOB: 1956/06/27, 60 y.o.   MRN: [id number] Complaint:  Visit Type: Patient returns to my office for continued preventative foot care services. Complaint: Patient states" my nails have grown long and thick and become painful to walk and wear shoes" Patient has been diagnosed with DM with no foot complications. The patient presents for preventative foot care services. No changes to ROS  Podiatric Exam: Vascular: dorsalis pedis and posterior tibial pulses are palpable bilateral. Capillary return is immediate. Temperature gradient is WNL. Skin turgor WNL  Sensorium: Abnormal  Semmes Weinstein monofilament test. Normal tactile sensation bilaterally. Nail Exam: Pt has thick disfigured discolored nails with subungual debris noted bilateral entire nail hallux  Ulcer Exam: There is no evidence of ulcer or pre-ulcerative changes or infection. Orthopedic Exam: Muscle tone and strength are WNL. No limitations in general ROM. No crepitus or effusions noted. Foot type and digits show no abnormalities. Bony prominences are unremarkable. Skin: No Porokeratosis. No infection or ulcers  Diagnosis:  Onychomycosis, , Pain in right toe, pain in left toes  Treatment & Plan Procedures and Treatment: Consent by patient was obtained for treatment procedures. The patient understood the discussion of treatment and procedures well. All questions were answered thoroughly reviewed. Debridement of mycotic and hypertrophic toenails, 1 through 5 bilateral and clearing of subungual debris. No ulceration, no infection noted.  Return Visit-Office Procedure: Patient instructed to return to the office for a follow up visit 3 months for continued evaluation and treatment.    Gardiner Barefoot DPM

## 2016-09-06 DIAGNOSIS — R9439 Abnormal result of other cardiovascular function study: Secondary | ICD-10-CM | POA: Diagnosis present

## 2016-09-06 NOTE — H&P (Addendum)
OFFICE VISIT NOTES COPIED TO EPIC FOR DOCUMENTATION  . History of Present Illness Eduardo [name] AGNP-C; 09-06-16 1:54 PM) Patient words: Last O/V 08/10/2016; F/U for Abnormal Stress Test.  The patient is a 60 year old male who presents for a Follow-up for Screening for CAD. He has a history of uncontrolled type II diabetes, hyperlipidemia, and hypertension. He was recently seen by his endocrinologist and is wanting to start an exercise program and it was recommended he have a cardiac evaluation prior to this, given signficant risk factors for CAD. He also has a history of established PAD, this is followed by Dr. Deitra Mayo. He reports mild symptoms of claudication, right more so than left, which have improved as he has been walking and exercising more.   He denies any chest pain, shortness of breath, PND, orthopnea, edema, dizziness, syncope, or symptoms suggestive of TIA. No symptoms suggestive of sleep apnea, including snoring or daytime somnolence.  He has struggled with his weight most of his life, however has been previously successful in weight loss and had been able to get down to 200 pounds several years ago. His wife had been ill over the past several years due to nonalcoholic cirrhosis of the liver and he regained a significant amount of weight during this time. She has since passed away in 02/16/2016.  He was scheduled for nuclear stress test to evaluate for ischemia and presents today for follow up.   Problem List/Past Medical Anderson Malta Sergeant; 09-06-2016 1:05 PM) Uncontrolled type 2 diabetes mellitus with complication, with long-term current use of insulin (E11.8, E11.65)  Hypercholesteremia (E78.00)  Hypertension, essential, benign (I10)  Morbid obesity with BMI of 45.0-49.9, adult (E66.01)  Screening for ischemic heart disease (Z13.6)  Exercise sestamibi stress test 08/17/2016: 1. Resting EKG demonstrates normal sinus rhythm, left axis deviation, left  plantar fascicular block. Poor R-wave progression, ulnar disease pattern. Nonspecific ST-T abnormality, cannot exclude lateral ischemia. Stress EKG is equivocal for ischemia, patient developing atypical left otherwise for with exercise which reverted back to baseline immediately on combination of the stress test less than 60 seconds. There was no additional ST-T wave changes of ischemia. Patient exercised on Bruce protocol for 4:25 minutes and achieved 5.45 METS. Stress test terminated due to 89 % MPHR achieved (Target HR >85%). Symptoms included dizziness. 2. 2-Day protocol followed. Perfusion imaging studies demonstrate large sized severe perfusion defect involving the inferior, anterior and anterolateral consistent with inferior wall scar with moderate peri-infarct ischemia and severe anterior and anteroapical reversible ischemia extending from the base towards the apex. Left ventricular systolic function calculating by QGS was markedly depressed at 26%. This is a high risk study, consider further cardiac work-up. PAD (peripheral artery disease) (I73.9)  06/10/2016: R ABI 0.72, L ABI 1.0 Peripheral arteriogram 04/18/2012: Right SFA occluded from proximal to distal segment, reconstitutes at the level of the popliteal artery. Two-vessel runoff in the form of peroneal and posterior tibial artery. Left SFA mild disease, moderate disease in the peroneal and posterior tibial vessels, three-vessel runoff. Follows Dr. Scot Dock. 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 in mid 80 diabetes, no known heart conditions Father  Deceased. 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 quit in 2013, smoked for about 40 years Non Drinker/No Alcohol Use  Marital status  Widowed. Living Situation  Lives alone. Number of Children  0.  Past Surgical History Anderson Malta Sergeant; 08/21/2016 1:05 PM) None 08/10/2016  Medication History Anderson Malta Sergeant; 08/21/2016 1:11 PM) Crestor (10MG Tablet, 1 (one) Tablet Oral daily, Taken starting 08/10/2016) Active. Lisinopril (20MG Tablet, 1 (one) Tablet Oral daily, Taken starting 08/10/2016) Active. (**Note dosage change**) Multivitamin Adult (1 Oral daily) Specific dose unknown - Active. Ester-C (1000-50MG Tablet, 1 Oral daily) Active. Aspirin (81MG Tablet DR, 1 Oral daily) Active. Vitamin D3 (2000UNIT Capsule, 1 Oral DAILy) Active. Probiotic (1 Oral daily) Specific dose unknown - Active. Krill Oil (1000MG Capsule, 1 Oral daily) Active. Cartia XT (120MG/24HR Capsule ER, 1 Oral daily) Active. Gemfibrozil (600MG Tablet, 1 Oral twice a day) Active. HumuLIN N KwikPen (100UNIT/ML Susp Pen-inj, 60 units Subcutaneous three times daily) Active. HumaLOG KwikPen (100UNIT/ML Soln Pen-inj, sliding scale Subcutaneous three times daily) Active. White Mulberry Leaf Extract (41m daily) Active. Gluco Burn (three times daily) Active. Lagerstroemia sylvestre leaf extract (554mdaily) Active. Medications Reconciled (verbally)  Diagnostic Studies History (JAnderson Maltaergeant; 08/21/2016 1:06 PM) Nuclear stress test 08/17/2016 1. Resting EKG demonstrates normal sinus rhythm, left axis deviation, left plantar fascicular block. Poor R-wave progression, ulnar disease pattern. Nonspecific ST-T abnormality, cannot exclude lateral ischemia. Stress EKG is equivocal for ischemia, patient developing atypical left otherwise for with exercise which reverted back to baseline immediately on combination of the stress test less than 60 seconds.  There was no additional ST-T wave changes of ischemia. Patient exercised on Bruce protocol for 4:25 minutes and achieved 5.45 METS. Stress test terminated due to 89 % MPHR achieved (Target HR >85%). Symptoms included dizziness. 2. 2-Day protocol followed. Perfusion imaging studies demonstrate large sized severe perfusion defect involving the inferior, anterior and anterolateral consistent with inferior wall scar with moderate peri-infarct ischemia and severe anterior and anteroapical reversible ischemia extending from the base towards the apex. Left ventricular systolic function calculating by QGS was markedly depressed at 26%. This is a high risk study, consider further cardiac work-up. Labwork  07/14/2016: Glucose 155, BUN 28, creatinine 1.4, EGFR 56, potassium 5.7, HbA1c 8.3%, total cholesterol 165, triglycerides 130, HDL 35, LDL 104, hepatic function normal Colonoscopy 2016 Normal.    Review of Systems (BSmithfield FoodsAGNP-C; 08/21/2016 2:1 PM) General Not Present- Anorexia, Fatigue and Fever. Respiratory Not Present- Cough, Decreased Exercise Tolerance, Difficulty Breathing on Exertion and Dyspnea. Cardiovascular Present- Claudications. Not Present- Chest Pain, Edema, Orthopnea, Palpitations and Paroxysmal Nocturnal Dyspnea. Gastrointestinal Not Present- Black, Tarry Stool, Change in Bowel Habits and Nausea. Neurological Not Present- Focal Neurological Symptoms and Syncope. Endocrine Not Present- Cold Intolerance, Excessive Sweating, Heat Intolerance and Thyroid Problems. Hematology Not Present- Anemia, Easy Bruising, Petechiae and Prolonged Bleeding.  Vitals (JAnderson Maltaergeant; 08/21/2016 1:13 PM) 08/21/2016 1:06 PM Weight: 331 lb Height: 70in Body Surface Area: 2.59 m Body Mass Index: 47.49 kg/m  Pulse: 78 (Regular)  P.OX: 97% (Room air) BP: 149/71 (Sitting, Left Arm, Standard)       Physical Exam (Bridgette AlEbony HailAGNP-C; 08/21/2016 2:1 PM) General Mental  Status-Alert. General Appearance-Cooperative, Appears stated age, Not in acute distress. Build & Nutrition-Well built and Morbidly obese.  Head and Neck Neck -Note: Short neck and difficult to evaluate JVD.  Thyroid Gland Characteristics - no palpable nodules, no palpable enlargement.  Cardiovascular Cardiovascular examination reveals -normal heart sounds, regular rate and rhythm with no murmurs.  Inspection Jugular vein - Right - No Distention.  Abdomen Inspection Contour - Obese. Palpation/Percussion Normal exam - Non Tender and No hepatosplenomegaly. Auscultation Normal exam - Bowel sounds normal.  Peripheral Vascular Lower Extremity Inspection - Bilateral - Rapid capillary refill. Palpation - Temperature - Bilateral - Warm. Edema - Bilateral - No edema. Femoral pulse - Bilateral - Feeble(Pulsus difficult to feel due to patient's bodily habitus.), No Bruits. Popliteal pulse - Bilateral - Feeble(Pulsus difficult to feel due to patient's bodily habitus.). Dorsalis pedis pulse - Bilateral - Normal. Posterior tibial pulse - Bilateral - Normal. Carotid arteries - Bilateral-No Carotid bruit.  Neurologic Neurologic evaluation reveals -alert and oriented x 3 with no impairment of recent or remote memory. Motor-Grossly intact without any focal deficits.  Musculoskeletal Global Assessment Left Lower Extremity - normal range of motion without pain. Right Lower Extremity - normal range of motion without pain.    Assessment & Plan (Bridgette Allison AGNP-C; 08/21/2016 2:01 PM) Abnormal nuclear stress test (R94.39) Story: Exercise sestamibi stress test 08/17/2016: 1. Resting EKG demonstrates normal sinus rhythm, left axis deviation, left plantar fascicular block. Poor R-wave progression, ulnar disease pattern. Nonspecific ST-T abnormality, cannot exclude lateral ischemia. Stress EKG is equivocal for ischemia, patient developing atypical left otherwise for with  exercise which reverted back to baseline immediately on combination of the stress test less than 60 seconds. There was no additional ST-T wave changes of ischemia. Patient exercised on Bruce protocol for 4:25 minutes and achieved 5.45 METS. Stress test terminated due to 89 % MPHR achieved (Target HR >85%). Symptoms included dizziness. 2. 2-Day protocol followed. Perfusion imaging studies demonstrate large sized severe perfusion defect involving the inferior, anterior and anterolateral consistent with inferior wall scar with moderate peri-infarct ischemia and severe anterior and anteroapical reversible ischemia extending from the base towards the apex. Left ventricular systolic function calculating by QGS was markedly depressed at 26%. This is a high risk study, consider further cardiac work-up.  Echocardiogram 09/01/2016: Left ventricle cavity is normal in size. Moderate concentric hypertrophy of the left ventricle. Mild to moderately reduced left ventricular function. Left ventricle regional wall motion findings: Poor echo window and reduced sensitivity. There is inferior hypokinesis, lateral hypokinesis, mild global hypokinesis. Grade 1 diastolic dysfunction. Calculated EF 40%. Left atrial cavity is mildly dilated at 4.2 cm. IVC is dilated with poor inspiration collapse consistent with elevated right atrial pressure.  Current Plans Started Nitrostat 0.4MG, 1 (one) Tablet every 5 minutes as needed for chest pain., #25, 08/21/2016, Ref. x1. Started Metoprolol Tartrate 25MG, 1 (one) Tablet two times daily, #60, 08/21/2016, Ref. x1. Hypercholesteremia (E78.00) Current Plans Changed Crestor 20MG, 1 (one) Tablet daily, #30, 08/21/2016, Ref. x3. Local Order: *Note dose change* Future Plans 09/16/2016: LIPOPROTEIN, BLD, BY NMR (86761) - one time Hypertension, essential, benign (I10) Future Plans 95/0/9326: METABOLIC PANEL, COMPREHENSIVE (71245) - one time Morbid obesity with BMI of 45.0-49.9,  adult (E66.01)  Uncontrolled type 2 diabetes mellitus with complication, with long-term current use of insulin (E11.8, E11.65)   Current Plans Mechanism of underlying disease process and action of medications discussed with the patient. I discussed primary/secondary prevention and also dietary counseling was done. He presents for follow-up of recently performed nuclear stress test to evaluate for ischemia due to significant risk factors including hypertension, hyperlipidemia, uncontrolled diabetes, and established CAD. Patient was wanting to start an exercise program and wanted to be evaluated prior to doing this. Stress test was high risk for significant CAD revealing a large sized severe perfusion defect involving  the inferior, anterior and anterolateral walls consistent with inferior wall scar with moderate peri-infarct ischemia and severe anterior and anteroapical reversible ischemia extending from the base towards the apex. Left ventricular systolic function also markedly depressed at 26%. He has already been scheduled for an echocardiogram for definitive evaluation of LVEF. Given markedly abnormal stress test, will schedule for cardiac catheterization, and possible angioplasty. We discussed regarding risks, benefits, alternatives to this including CTA and continued medical therapy. Patient wants to proceed. Understands <1-2% risk of death, stroke, MI, urgent CABG, bleeding, infection, renal failure but not limited to these.  I have also added metoprolol tartrate 25 mg twice a day for cardioprotection and advised him to increase Crestor to 20 mg daily as he has been tolerating 10 mg without side effects. I have also sent in sublingual nitroglycerin and instructed on use as well as avoidance of concomitant use with phosphodiesterase inhibitors such as Viagra or Cialis. He states he will be out of town next week and cannot cancel this trip. He was advised not to do any exertional activities and to carry  sublingual nitroglycerin with him at all times. Follow-up after coronary angiogram for reevaluation.  *I have discussed this case with Dr. Einar Gip and he personally examined the patient and participated in formulating the plan.*    Signed by Eduardo [name], AGNP-C (08/21/2016 2:02 PM)

## 2016-09-08 ENCOUNTER — Ambulatory Visit (HOSPITAL_COMMUNITY)
Admission: RE | Admit: 2016-09-08 | Discharge: 2016-09-08 | Disposition: A | Discharge status: Home / Self Care | Payer: BLUE CROSS/BLUE SHIELD | Source: Ambulatory Visit | Attending: Cardiology | Admitting: Cardiology

## 2016-09-08 ENCOUNTER — Encounter (HOSPITAL_COMMUNITY): Payer: Self-pay | Admitting: Cardiology

## 2016-09-08 ENCOUNTER — Encounter (HOSPITAL_COMMUNITY)
Admission: RE | Disposition: A | Discharge status: Home / Self Care | Payer: Self-pay | Source: Ambulatory Visit | Attending: Cardiology

## 2016-09-08 DIAGNOSIS — E1140 Type 2 diabetes mellitus with diabetic neuropathy, unspecified: Secondary | ICD-10-CM | POA: Diagnosis not present

## 2016-09-08 DIAGNOSIS — N189 Chronic kidney disease, unspecified: Secondary | ICD-10-CM | POA: Diagnosis not present

## 2016-09-08 DIAGNOSIS — E7800 Pure hypercholesterolemia, unspecified: Secondary | ICD-10-CM | POA: Diagnosis not present

## 2016-09-08 DIAGNOSIS — Z6841 Body Mass Index (BMI) 40.0 and over, adult: Secondary | ICD-10-CM | POA: Insufficient documentation

## 2016-09-08 DIAGNOSIS — I2510 Atherosclerotic heart disease of native coronary artery without angina pectoris: Secondary | ICD-10-CM | POA: Diagnosis not present

## 2016-09-08 DIAGNOSIS — R9439 Abnormal result of other cardiovascular function study: Secondary | ICD-10-CM | POA: Diagnosis present

## 2016-09-08 DIAGNOSIS — Z794 Long term (current) use of insulin: Secondary | ICD-10-CM | POA: Insufficient documentation

## 2016-09-08 DIAGNOSIS — Z7982 Long term (current) use of aspirin: Secondary | ICD-10-CM | POA: Diagnosis not present

## 2016-09-08 DIAGNOSIS — E1165 Type 2 diabetes mellitus with hyperglycemia: Secondary | ICD-10-CM | POA: Insufficient documentation

## 2016-09-08 DIAGNOSIS — E1151 Type 2 diabetes mellitus with diabetic peripheral angiopathy without gangrene: Secondary | ICD-10-CM | POA: Diagnosis not present

## 2016-09-08 DIAGNOSIS — Z79899 Other long term (current) drug therapy: Secondary | ICD-10-CM | POA: Diagnosis not present

## 2016-09-08 DIAGNOSIS — I2584 Coronary atherosclerosis due to calcified coronary lesion: Secondary | ICD-10-CM | POA: Diagnosis not present

## 2016-09-08 DIAGNOSIS — E1122 Type 2 diabetes mellitus with diabetic chronic kidney disease: Secondary | ICD-10-CM | POA: Diagnosis not present

## 2016-09-08 DIAGNOSIS — Z87891 Personal history of nicotine dependence: Secondary | ICD-10-CM | POA: Diagnosis not present

## 2016-09-08 DIAGNOSIS — E785 Hyperlipidemia, unspecified: Secondary | ICD-10-CM | POA: Insufficient documentation

## 2016-09-08 DIAGNOSIS — I129 Hypertensive chronic kidney disease with stage 1 through stage 4 chronic kidney disease, or unspecified chronic kidney disease: Secondary | ICD-10-CM | POA: Diagnosis not present

## 2016-09-08 DIAGNOSIS — I2582 Chronic total occlusion of coronary artery: Secondary | ICD-10-CM | POA: Diagnosis not present

## 2016-09-08 HISTORY — PX: CARDIAC CATHETERIZATION: SHX172

## 2016-09-08 LAB — BASIC METABOLIC PANEL
ANION GAP: 6 (ref 5–15)
BUN: 34 mg/dL — ABNORMAL HIGH (ref 6–20)
CALCIUM: 9.3 mg/dL (ref 8.9–10.3)
CHLORIDE: 110 mmol/L (ref 101–111)
CO2: 22 mmol/L (ref 22–32)
Creatinine, Ser: 1.34 mg/dL — ABNORMAL HIGH (ref 0.61–1.24)
GFR calc non Af Amer: 56 mL/min — ABNORMAL LOW (ref >60)
GLUCOSE: 306 mg/dL — AB (ref 65–99)
POTASSIUM: 4.8 mmol/L (ref 3.5–5.1)
Sodium: 138 mmol/L (ref 135–145)

## 2016-09-08 LAB — GLUCOSE, CAPILLARY: Glucose-Capillary: 287 mg/dL — ABNORMAL HIGH (ref 65–99)

## 2016-09-08 SURGERY — LEFT HEART CATH AND CORONARY ANGIOGRAPHY

## 2016-09-08 MED ORDER — HEPARIN SODIUM (PORCINE) 1000 UNIT/ML IJ SOLN
INTRAMUSCULAR | Status: DC | PRN
  Administered 2016-09-08: 8000 [IU] via INTRAVENOUS

## 2016-09-08 MED ORDER — VERAPAMIL HCL 2.5 MG/ML IV SOLN
INTRAVENOUS | Status: AC
  Filled 2016-09-08: qty 2

## 2016-09-08 MED ORDER — SODIUM CHLORIDE 0.9% FLUSH: 3.0000 mL | Freq: Two times a day (BID) | INTRAVENOUS | Status: DC

## 2016-09-08 MED ORDER — IOPAMIDOL (ISOVUE-370) INJECTION 76%
INTRAVENOUS | Status: AC
Start: 2016-09-08 — End: 2016-09-08
  Filled 2016-09-08: qty 100

## 2016-09-08 MED ORDER — SODIUM CHLORIDE 0.9% FLUSH: 3.0000 mL | INTRAVENOUS | Status: DC | PRN

## 2016-09-08 MED ORDER — HEPARIN (PORCINE) IN NACL 2-0.9 UNIT/ML-% IJ SOLN
INTRAMUSCULAR | Status: DC | PRN
  Administered 2016-09-08: 1500 mL

## 2016-09-08 MED ORDER — VERAPAMIL HCL 2.5 MG/ML IV SOLN
INTRA_ARTERIAL | Status: DC | PRN
  Administered 2016-09-08: 7 mL via INTRA_ARTERIAL

## 2016-09-08 MED ORDER — LIDOCAINE HCL (PF) 1 % IJ SOLN
INTRAMUSCULAR | Status: AC
  Filled 2016-09-08: qty 30

## 2016-09-08 MED ORDER — HYDROMORPHONE HCL 1 MG/ML IJ SOLN
INTRAMUSCULAR | Status: DC | PRN
  Administered 2016-09-08: 0.5 mg via INTRAVENOUS

## 2016-09-08 MED ORDER — NITROGLYCERIN 1 MG/10 ML FOR IR/CATH LAB
INTRA_ARTERIAL | Status: AC
  Filled 2016-09-08: qty 10

## 2016-09-08 MED ORDER — HEPARIN SODIUM (PORCINE) 1000 UNIT/ML IJ SOLN
INTRAMUSCULAR | Status: AC
  Filled 2016-09-08: qty 1

## 2016-09-08 MED ORDER — LIDOCAINE HCL (PF) 1 % IJ SOLN
INTRAMUSCULAR | Status: DC | PRN
  Administered 2016-09-08: 2 mL

## 2016-09-08 MED ORDER — HYDROMORPHONE HCL 1 MG/ML IJ SOLN
INTRAMUSCULAR | Status: AC
  Filled 2016-09-08: qty 1

## 2016-09-08 MED ORDER — SODIUM CHLORIDE 0.9 % IV SOLN
INTRAVENOUS | Status: DC
  Administered 2016-09-08: 08:00:00 via INTRAVENOUS

## 2016-09-08 MED ORDER — ASPIRIN 81 MG PO CHEW: 81.0000 mg | CHEWABLE_TABLET | ORAL | Status: DC

## 2016-09-08 MED ORDER — IOPAMIDOL (ISOVUE-370) INJECTION 76%
INTRAVENOUS | Status: AC
  Filled 2016-09-08: qty 50

## 2016-09-08 MED ORDER — MIDAZOLAM HCL 2 MG/2ML IJ SOLN
INTRAMUSCULAR | Status: DC | PRN
  Administered 2016-09-08: 2 mg via INTRAVENOUS

## 2016-09-08 MED ORDER — MIDAZOLAM HCL 2 MG/2ML IJ SOLN
INTRAMUSCULAR | Status: AC
  Filled 2016-09-08: qty 2

## 2016-09-08 MED ORDER — SODIUM CHLORIDE 0.9 % IV SOLN: 250.0000 mL | INTRAVENOUS | Status: DC | PRN

## 2016-09-08 MED ORDER — HEPARIN (PORCINE) IN NACL 2-0.9 UNIT/ML-% IJ SOLN
INTRAMUSCULAR | Status: AC
  Filled 2016-09-08: qty 1000

## 2016-09-08 MED ORDER — IOPAMIDOL (ISOVUE-370) INJECTION 76%
INTRAVENOUS | Status: DC | PRN
  Administered 2016-09-08: 150 mL via INTRA_ARTERIAL

## 2016-09-08 SURGICAL SUPPLY — 10 items
CATH INFINITI 5FR ANG PIGTAIL (CATHETERS) ×6 IMPLANT
CATH OPTITORQUE TIG 4.0 5F (CATHETERS) ×3 IMPLANT
DEVICE RAD COMP TR BAND LRG (VASCULAR PRODUCTS) ×3 IMPLANT
GLIDESHEATH SLEND A-KIT 6F 20G (SHEATH) ×3 IMPLANT
KIT HEART LEFT (KITS) ×3 IMPLANT
PACK CARDIAC CATHETERIZATION (CUSTOM PROCEDURE TRAY) ×3 IMPLANT
SYR MEDRAD MARK V 150ML (SYRINGE) ×3 IMPLANT
TRANSDUCER W/STOPCOCK (MISCELLANEOUS) ×3 IMPLANT
TUBING CIL FLEX 10 FLL-RA (TUBING) ×3 IMPLANT
WIRE SAFE-T 1.5MM-J .035X260CM (WIRE) ×3 IMPLANT

## 2016-09-08 NOTE — Discharge Instructions (Signed)
Radial Site Care °Refer to this sheet in the next few weeks. These instructions provide you with information about caring for yourself after your procedure. Your health care provider may also give you more specific instructions. Your treatment has been planned according to current medical practices, but problems sometimes occur. Call your health care provider if you have any problems or questions after your procedure. °WHAT TO EXPECT AFTER THE PROCEDURE °After your procedure, it is typical to have the following: °· Bruising at the radial site that usually fades within 1-2 weeks. °· Blood collecting in the tissue (hematoma) that may be painful to the touch. It should usually decrease in size and tenderness within 1-2 weeks. °HOME CARE INSTRUCTIONS °· Take medicines only as directed by your health care provider. °· You may shower 24-48 hours after the procedure or as directed by your health care provider. Remove the bandage (dressing) and gently wash the site with plain soap and water. Pat the area dry with a clean towel. Do not rub the site, because this may cause bleeding. °· Do not take baths, swim, or use a hot tub until your health care provider approves. °· Check your insertion site every day for redness, swelling, or drainage. °· Do not apply powder or lotion to the site. °· Do not flex or bend the affected arm for 24 hours or as directed by your health care provider. °· Do not push or pull heavy objects with the affected arm for 24 hours or as directed by your health care provider. °· Do not lift over 10 lb (4.5 kg) for 5 days after your procedure or as directed by your health care provider. °· Ask your health care provider when it is okay to: °¨ Return to work or school. °¨ Resume usual physical activities or sports. °¨ Resume sexual activity. °· Do not drive home if you are discharged the same day as the procedure. Have someone else drive you. °· You may drive 24 hours after the procedure unless otherwise  instructed by your health care provider. °· Do not operate machinery or power tools for 24 hours after the procedure. °· If your procedure was done as an outpatient procedure, which means that you went home the same day as your procedure, a responsible adult should be with you for the first 24 hours after you arrive home. °· Keep all follow-up visits as directed by your health care provider. This is important. °SEEK MEDICAL CARE IF: °· You have a fever. °· You have chills. °· You have increased bleeding from the radial site. Hold pressure on the site. °SEEK IMMEDIATE MEDICAL CARE IF: °· You have unusual pain at the radial site. °· You have redness, warmth, or swelling at the radial site. °· You have drainage (other than a small amount of blood on the dressing) from the radial site. °· The radial site is bleeding, and the bleeding does not stop after 30 minutes of holding steady pressure on the site. °· Your arm or hand becomes pale, cool, tingly, or numb. °  °This information is not intended to replace advice given to you by your health care provider. Make sure you discuss any questions you have with your health care provider. °  °Document Released: 11/28/2010 Document Revised: 11/16/2014 Document Reviewed: 05/14/2014 °Elsevier Interactive Patient Education ©2016 Else[employer] Inc. ° °

## 2016-09-08 NOTE — Interval H&P Note (Signed)
History and Physical Interval Note:  09/08/2016 9:23 AM  Eduardo [name]  has presented today for surgery, with the diagnosis of abnormal stress  The various methods of treatment have been discussed with the patient and family. After consideration of risks, benefits and other options for treatment, the patient has consented to  Procedure(s): Left Heart Cath and Coronary Angiography (N/A) and possible PCI as a surgical intervention .  The patient's history has been reviewed, patient examined, no change in status, stable for surgery.  I have reviewed the patient's chart and labs.  Questions were answered to the patient's satisfaction.   Ischemic Symptoms? CCS I (Ordinary physical activity does not cause anginal symptoms) Anti-ischemic Medical Therapy? Minimal Therapy (1 class of medications) Non-invasive Test Results? High-risk stress test findings: cardiac mortality >3%/yr Prior CABG? No Previous CABG   Patient Information:   1-2V CAD, no prox LAD  A (7)  Indication: 18; Score: 7   Patient Information:   CTO of 1 vessel, no other CAD  U (5)  Indication: 28; Score: 5   Patient Information:   1V CAD with prox LAD  A (8)  Indication: 34; Score: 8   Patient Information:   2V-CAD with prox LAD  A (8)  Indication: 40; Score: 8   Patient Information:   3V-CAD without LMCA  A (8)  Indication: 46; Score: 8   Patient Information:   3V-CAD without LMCA With Abnormal LV systolic function  A (9)  Indication: 48; Score: 9   Patient Information:   LMCA-CAD  A (9)  Indication: 49; Score: 9   Patient Information:   2V-CAD with prox LAD PCI  A (7)  Indication: 62; Score: 7   Patient Information:   2V-CAD with prox LAD CABG  A (8)  Indication: 62; Score: 8   Patient Information:   3V-CAD without LMCA With Low CAD burden(i.e., 3 focal stenoses, low SYNTAX score) PCI  A (7)  Indication: 63; Score: 7   Patient Information:   3V-CAD without LMCA With  Low CAD burden(i.e., 3 focal stenoses, low SYNTAX score) CABG  A (9)  Indication: 63; Score: 9   Patient Information:   3V-CAD without LMCA E06c - Intermediate-high CAD burden (i.e., multiple diffuse lesions, presence of CTO, or high SYNTAX score) PCI  U (4)  Indication: 64; Score: 4   Patient Information:   3V-CAD without LMCA E06c - Intermediate-high CAD burden (i.e., multiple diffuse lesions, presence of CTO, or high SYNTAX score) CABG  A (9)  Indication: 64; Score: 9   Patient Information:   LMCA-CAD With Isolated LMCA stenosis  PCI  U (6)  Indication: 65; Score: 6   Patient Information:   LMCA-CAD With Isolated LMCA stenosis  CABG  A (9)  Indication: 65; Score: 9   Patient Information:   LMCA-CAD Additional CAD, low CAD burden (i.e., 1- to 2-vessel additional involvement, low SYNTAX score) PCI  U (5)  Indication: 66; Score: 5   Patient Information:   LMCA-CAD Additional CAD, low CAD burden (i.e., 1- to 2-vessel additional involvement, low SYNTAX score) CABG  A (9)  Indication: 66; Score: 9   Patient Information:   LMCA-CAD Additional CAD, intermediate-high CAD burden (i.e., 3-vessel involvement, presence of CTO, or high SYNTAX score) PCI  I (3)  Indication: 67; Score: 3   Patient Information:   LMCA-CAD Additional CAD, intermediate-high CAD burden (i.e., 3-vessel involvement, presence of CTO, or high SYNTAX score) CABG  A (9)  Indication: 67; Score: 9  Adrian Prows

## 2016-09-14 ENCOUNTER — Ambulatory Visit (INDEPENDENT_AMBULATORY_CARE_PROVIDER_SITE_OTHER): Payer: BLUE CROSS/BLUE SHIELD | Admitting: Ophthalmology

## 2016-09-14 ENCOUNTER — Encounter (HOSPITAL_COMMUNITY): Payer: Self-pay | Admitting: Cardiology

## 2016-09-14 DIAGNOSIS — E11319 Type 2 diabetes mellitus with unspecified diabetic retinopathy without macular edema: Secondary | ICD-10-CM | POA: Diagnosis not present

## 2016-09-14 DIAGNOSIS — H43813 Vitreous degeneration, bilateral: Secondary | ICD-10-CM

## 2016-09-14 DIAGNOSIS — I10 Essential (primary) hypertension: Secondary | ICD-10-CM

## 2016-09-14 DIAGNOSIS — E113592 Type 2 diabetes mellitus with proliferative diabetic retinopathy without macular edema, left eye: Secondary | ICD-10-CM | POA: Diagnosis not present

## 2016-09-14 DIAGNOSIS — H35033 Hypertensive retinopathy, bilateral: Secondary | ICD-10-CM

## 2016-09-14 DIAGNOSIS — E113391 Type 2 diabetes mellitus with moderate nonproliferative diabetic retinopathy without macular edema, right eye: Secondary | ICD-10-CM

## 2016-09-18 ENCOUNTER — Other Ambulatory Visit: Payer: Self-pay | Admitting: Cardiology

## 2016-09-25 NOTE — Addendum Note (Signed)
Addended by: Lianne Cure A on: 09/25/2016 03:59 PM   Modules accepted: Orders

## 2016-09-29 ENCOUNTER — Encounter: Payer: Self-pay | Admitting: Family Medicine

## 2016-10-14 ENCOUNTER — Encounter: Payer: Self-pay | Admitting: Podiatry

## 2016-10-14 ENCOUNTER — Ambulatory Visit (INDEPENDENT_AMBULATORY_CARE_PROVIDER_SITE_OTHER): Payer: BLUE CROSS/BLUE SHIELD | Admitting: Podiatry

## 2016-10-14 VITALS — Ht 70.0 in | Wt 333.0 lb

## 2016-10-14 DIAGNOSIS — B351 Tinea unguium: Secondary | ICD-10-CM | POA: Diagnosis not present

## 2016-10-14 DIAGNOSIS — M79676 Pain in unspecified toe(s): Secondary | ICD-10-CM | POA: Diagnosis not present

## 2016-10-14 DIAGNOSIS — E1140 Type 2 diabetes mellitus with diabetic neuropathy, unspecified: Secondary | ICD-10-CM | POA: Diagnosis not present

## 2016-10-14 NOTE — Progress Notes (Signed)
Patient ID: Eduardo [name], male   DOB: 06/10/1956, 60 y.o.   MRN: [id number] Complaint:  Visit Type: Patient returns to my office for continued preventative foot care services. Complaint: Patient states" my nails have grown long and thick and become painful to walk and wear shoes" Patient has been diagnosed with DM with no foot complications. The patient presents for preventative foot care services. No changes to ROS  Podiatric Exam: Vascular: dorsalis pedis and posterior tibial pulses are palpable bilateral. Capillary return is immediate. Temperature gradient is WNL. Skin turgor WNL  Sensorium: Abnormal  Semmes Weinstein monofilament test. Normal tactile sensation bilaterally. Nail Exam: Pt has thick disfigured discolored nails with subungual debris noted bilateral entire nail hallux  Ulcer Exam: There is no evidence of ulcer or pre-ulcerative changes or infection. Orthopedic Exam: Muscle tone and strength are WNL. No limitations in general ROM. No crepitus or effusions noted. Foot type and digits show no abnormalities. Bony prominences are unremarkable. Skin: No Porokeratosis. No infection or ulcers  Diagnosis:  Onychomycosis, , Pain in right toe, pain in left toes  Treatment & Plan Procedures and Treatment: Consent by patient was obtained for treatment procedures. The patient understood the discussion of treatment and procedures well. All questions were answered thoroughly reviewed. Debridement of mycotic and hypertrophic toenails, 1 through 5 bilateral and clearing of subungual debris. No ulceration, no infection noted.  Return Visit-Office Procedure: Patient instructed to return to the office for a follow up visit 3 months for continued evaluation and treatment.    Arita Severtson DPM 

## 2016-10-15 ENCOUNTER — Encounter: Payer: Self-pay | Admitting: Family Medicine

## 2016-10-15 ENCOUNTER — Ambulatory Visit (INDEPENDENT_AMBULATORY_CARE_PROVIDER_SITE_OTHER): Payer: BLUE CROSS/BLUE SHIELD | Admitting: Family Medicine

## 2016-10-15 VITALS — BP 138/74 | HR 76 | Temp 98.1°F | Ht 70.0 in | Wt 341.2 lb

## 2016-10-15 DIAGNOSIS — J310 Chronic rhinitis: Secondary | ICD-10-CM

## 2016-10-15 DIAGNOSIS — Z23 Encounter for immunization: Secondary | ICD-10-CM | POA: Diagnosis not present

## 2016-10-15 DIAGNOSIS — T485X1A Poisoning by other anti-common-cold drugs, accidental (unintentional), initial encounter: Secondary | ICD-10-CM | POA: Diagnosis not present

## 2016-10-15 DIAGNOSIS — R0981 Nasal congestion: Secondary | ICD-10-CM

## 2016-10-15 DIAGNOSIS — T485X5A Adverse effect of other anti-common-cold drugs, initial encounter: Principal | ICD-10-CM

## 2016-10-15 MED ORDER — FLUTICASONE PROPIONATE 50 MCG/ACT NA SUSP: 2.0000 | Freq: Every day | NASAL | 6 refills | Status: DC

## 2016-10-15 NOTE — Patient Instructions (Signed)
  Avoid decongestants (such as sudafed/phenylephrine/dayquil, etc). You can use Coricidin HBP, vs trying an antihistamine such as claritin, allegra or zyrtec. You likely are having rebound swelling in the nose related to ongoing afrin use. Please start using flonase 2 sprays each nostril every day; this should take up to 1-2 weeks and offer significant improvement in your nasal congestion, runny nose and sneezing.  If you don't normally have allergies this time of year, you don't need to continue this long-term, but taper down off. In general, try and avoid using afrin for longer than 2-3 days.

## 2016-10-15 NOTE — Progress Notes (Signed)
Chief Complaint  Patient presents with  . Facial Pain    nasal congestion, facial pain and pressure. His biggest complaint is that he cannot breathe. He has lethargy due to this. Symptoms started 11/27.    He is complaining of nasal congestion, unable to breathe out of his nose. It is especially bad when he wakes up in the morning. He has been using Afrin with good (temporary) relief.  Using Afrin twice daily (supposed to last 12 hours) since 11/27.  After driving back from [place] for [place] he woke up feeling stuffy.  He is sneezing some, some clear runny nose. Sometimes sees some crusting when he blows his nose.  Denies any yellow or green drainage. Denies sinus pain/pressure. Denies ear pain, sore throat.  Occasional cough/postnasal drainage. Denies shortness of breath, productive cough. Denies itchy eyes.  Denies myalgias related to this illness. He tends to get seasonal allergies in the spring. He doesn't know if he has had a cold or if it could be allergies.  Yesterday he took Dayquil and Nyquil. He occasionally takes [employer] tablet version of similar day/nighttime meds.  He hasn't been seen by me since 2012. He had only been seeing Dr. Chalmers Cater for Diabetes and Dr. Zigmund Daniel for his eyes, up until this year. He wanted to start an exercise regimen, so it was recommended he have a stress test.  He had known PAD (and saw Dr. Scot Dock). He saw Dr. Einar Gip for cardiac evaluation in October 2017. Stress test was abnormal. Echo showed LVH, and decreased LV function, EF approx 40%, and showed mild global hypokinesis, grade 1 diastolic dysfunction. Results of cardiac cath done by Dr. Einar Gip: Coronary angiogram 09/08/2016: 1. Low normal LV systolic function, EF [id number] with apical akinesis to dyskinesis. 2. Moderate coronary artery calcification, diffuse 3. Mid LAD occluded and collateralized by faint ipsilateral and contralateral collaterals from RCA. Diagonal 1 is moderate sized with ostial 30% stenosis,  diagonal 2 is small to moderate sized and bifurcates, proximal segment has 80% stenosis 4. Moderate to circumflex, giving origin to moderate sized OM 1 which bifurcates. OM1 is occluded and has ipsilateral collaterals. 5. Large RCA. Dominant. Proximal 30%. Calcified. Gives origin to 4 PL branches, PL 1 large with ostial 80% stenosis, PL 2 small, PLT 3 large and has a long segment occlusion and collaterals from both left and ipsilateral collaterals. PL 4 is moderate-sized mild disease. 6. Patent LIMA and RIMA. Recommendation: Patient has complex coronary artery disease, however the LAD and circumflex coronary artery may be amenable for coronary angioplasty, right coronary artery is diffusely diseased and hence only would recommend medical therapy. CABG with LIMA to LAD and potential use of a arterial conduit to the circumflex is also good option in a patient with diabetes mellitus. Right coronary artery does not appear to be amenable for the CABG. Will discuss with colleagues. Patient with morbid obesity, uncontrolled diabetes mellitus needs aggressive risk modification. He'll be discharged home with outpatient management for now.  Lots of med changes were made, for medical management--started on beta blocker, statin, nitrates.  He is having some headache and fatigue as side effects from medications.  Overall, he likes Dr. Einar Gip, but not thrilled with the practice. (hard to get his questions answered). Asking my opinion.  PMH, PSH, SH reviewed and updated  Outpatient Encounter Prescriptions as of 10/15/2016  Medication Sig Note  . aspirin EC 81 MG tablet Take 81 mg by mouth daily. 09/04/2016: (Heart health)  . Bioflavonoid Products (ESTER-C) 1000-50 MG  TABS Take 1 tablet by mouth daily.    Marland Kitchen CARTIA XT 120 MG 24 hr capsule Take 120 mg by mouth daily.    . Cholecalciferol (VITAMIN D) 2000 units CAPS Take 2,000 Units by mouth daily.   Marland Kitchen gemfibrozil (LOPID) 600 MG tablet Take 600 mg by mouth 2 (two) times  daily before a meal.     . HUMALOG KWIKPEN 100 UNIT/ML KiwkPen Inject 0-40 Units into the skin 3 (three) times daily with meals. SLIDING SCALE DEPENDING ON BLOOD GLUCOSE BEFORE MEAL   . HUMULIN N KWIKPEN 100 UNIT/ML Kiwkpen Inject 60 Units into the skin 3 (three) times daily.   . isosorbide mononitrate (IMDUR) 60 MG 24 hr tablet Take 60 mg by mouth daily.   . metoprolol (LOPRESSOR) 50 MG tablet Take 50 mg by mouth 2 (two) times daily.   . Multiple Vitamin (MULTIVITAMIN) tablet Take 1 tablet by mouth daily.   . Probiotic Product (PROBIOTIC-10 PO) Take 1 capsule by mouth daily.   . rosuvastatin (CRESTOR) 20 MG tablet Take 20 mg by mouth daily.   . nitroGLYCERIN (NITROSTAT) 0.4 MG SL tablet Place 0.4 mg under the tongue every 5 (five) minutes as needed for chest pain. 09/04/2016: NEVER USED  . [DISCONTINUED] Glucosamine-Chondroitin (MOVE FREE PO) Take 353 mg by mouth daily.    . [DISCONTINUED] lisinopril (PRINIVIL,ZESTRIL) 20 MG tablet Take 20 mg by mouth daily.   . [DISCONTINUED] metoprolol tartrate (LOPRESSOR) 25 MG tablet Take 25 mg by mouth 2 (two) times daily.   . [DISCONTINUED] OVER THE COUNTER MEDICATION Take 1 capsule by mouth 3 (three) times daily before meals. GLUCOBURN (WHITE MULBERRY EXTRACT/ALPHA LIPOIC ACID/GYMNEMA SYLVESTRIS)    No facility-administered encounter medications on file as of 10/15/2016.    No Known Allergies  ROS: No known fever, chills, nausea, vomiting, diarrhea, rashes, bleeding, bruising. +headaches, unsure if could be related to his meds (takes Imdur). Nasal congestion as per HPI. Denies chest pain, shortness of breath. Moods are good.  PHYSICAL EXAM:  BP 138/74 (BP Location: Left Arm, Patient Position: Sitting, Cuff Size: Normal)   Pulse 76   Temp 98.1 F (36.7 C)   Ht 5\' 10"  (1.778 m)   Wt (!) 341 lb 3.2 oz (154.8 kg)   BMI 48.96 kg/m   Pleasant, morbidly obese male in good spirits, in no distress HEENT: PERRL, EOMI, conjunctiva and sclera are clear.  Nasal mucosa has mild edema, with slight yellow crust noted on the left. (he used afrin earlier this morning). No erythema or purulent drainage noted. Sinuses nontender. OP is clear TM's and EAC's normal Neck: no lymphadenopathy or mass Heart: regular rate and rhythm Lungs: clear bilaterally Abdomen: obese Extremities: trace edema Psych: normal mood, affect, hygiene and grooming Neuro: alert and oriented, cranial nerves intact, normal gait.  ASSESSMENT/PLAN:   Discussed risks/consequences of obesity (his wife died related to complications of obesity--caused cirrhosis). Discussed diet/exercise--bulk of weight loss will come from dietary changes, portions.  Advised that I fully support him seeing Dr. Einar Gip and his recommendations. Should he be unhappy with the practice, discussed other cardiologist group in town, but I encouraged him to stay with Dr. Einar Gip.  We briefly discussed health maintenance--that he is past due for routine screenings including prostate cancer evaluations. Flu shot was given Briefly discussed new Shingrix, recommended when available. He will need Hep C screening as well at his physical. To schedule physical (and be put on cancellation list).   Avoid decongestants (such as sudafed/phenylephrine/dayquil, etc). You can use  Coricidin HBP, vs trying an antihistamine such as claritin, allegra or zyrtec. You likely are having rebound swelling in the nose related to ongoing afrin use. Please start using flonase 2 sprays each nostril every day; this should take up to 1-2 weeks and offer significant improvement in your nasal congestion, runny nose and sneezing.  If you don't normally have allergies this time of year, you don't need to continue this long-term, but taper down off. In general, try and avoid using afrin for longer than 2-3 days.

## 2016-10-16 ENCOUNTER — Encounter: Payer: Self-pay | Admitting: Family Medicine

## 2017-01-13 ENCOUNTER — Ambulatory Visit (INDEPENDENT_AMBULATORY_CARE_PROVIDER_SITE_OTHER): Payer: BLUE CROSS/BLUE SHIELD | Admitting: Podiatry

## 2017-01-13 ENCOUNTER — Encounter: Payer: Self-pay | Admitting: Podiatry

## 2017-01-13 DIAGNOSIS — M79676 Pain in unspecified toe(s): Secondary | ICD-10-CM

## 2017-01-13 DIAGNOSIS — E1140 Type 2 diabetes mellitus with diabetic neuropathy, unspecified: Secondary | ICD-10-CM

## 2017-01-13 DIAGNOSIS — B351 Tinea unguium: Secondary | ICD-10-CM | POA: Diagnosis not present

## 2017-01-13 NOTE — Progress Notes (Signed)
Patient ID: Eduardo [name], male   DOB: Apr 30, 1956, 60 y.o.   MRN: [id number] Complaint:  Visit Type: Patient returns to my office for continued preventative foot care services. Complaint: Patient states" my nails have grown long and thick and become painful to walk and wear shoes" Patient has been diagnosed with DM with no foot complications. The patient presents for preventative foot care services. No changes to ROS  Podiatric Exam: Vascular: dorsalis pedis and posterior tibial pulses are palpable bilateral. Capillary return is immediate. Temperature gradient is WNL. Skin turgor WNL  Sensorium: Abnormal  Semmes Weinstein monofilament test. Normal tactile sensation bilaterally. Nail Exam: Pt has thick disfigured discolored nails with subungual debris noted bilateral entire nail hallux  Ulcer Exam: There is no evidence of ulcer or pre-ulcerative changes or infection. Orthopedic Exam: Muscle tone and strength are WNL. No limitations in general ROM. No crepitus or effusions noted. Foot type and digits show no abnormalities. Bony prominences are unremarkable. Skin: No Porokeratosis. No infection or ulcers  Diagnosis:  Onychomycosis, , Pain in right toe, pain in left toes  Treatment & Plan Procedures and Treatment: Consent by patient was obtained for treatment procedures. The patient understood the discussion of treatment and procedures well. All questions were answered thoroughly reviewed. Debridement of mycotic and hypertrophic toenails, 1 through 5 bilateral and clearing of subungual debris. No ulceration, no infection noted.  Return Visit-Office Procedure: Patient instructed to return to the office for a follow up visit 3 months for continued evaluation and treatment.    Gardiner Barefoot DPM

## 2017-03-16 ENCOUNTER — Ambulatory Visit (INDEPENDENT_AMBULATORY_CARE_PROVIDER_SITE_OTHER): Payer: BLUE CROSS/BLUE SHIELD | Admitting: Ophthalmology

## 2017-03-16 DIAGNOSIS — H43813 Vitreous degeneration, bilateral: Secondary | ICD-10-CM

## 2017-03-16 DIAGNOSIS — E113391 Type 2 diabetes mellitus with moderate nonproliferative diabetic retinopathy without macular edema, right eye: Secondary | ICD-10-CM | POA: Diagnosis not present

## 2017-03-16 DIAGNOSIS — H2513 Age-related nuclear cataract, bilateral: Secondary | ICD-10-CM

## 2017-03-16 DIAGNOSIS — E11311 Type 2 diabetes mellitus with unspecified diabetic retinopathy with macular edema: Secondary | ICD-10-CM | POA: Diagnosis not present

## 2017-03-16 DIAGNOSIS — H35033 Hypertensive retinopathy, bilateral: Secondary | ICD-10-CM

## 2017-03-16 DIAGNOSIS — I10 Essential (primary) hypertension: Secondary | ICD-10-CM | POA: Diagnosis not present

## 2017-04-07 ENCOUNTER — Ambulatory Visit (INDEPENDENT_AMBULATORY_CARE_PROVIDER_SITE_OTHER): Payer: BLUE CROSS/BLUE SHIELD | Admitting: Podiatry

## 2017-04-07 ENCOUNTER — Encounter: Payer: Self-pay | Admitting: Podiatry

## 2017-04-07 DIAGNOSIS — M79676 Pain in unspecified toe(s): Secondary | ICD-10-CM

## 2017-04-07 DIAGNOSIS — B351 Tinea unguium: Secondary | ICD-10-CM | POA: Diagnosis not present

## 2017-04-07 DIAGNOSIS — E1140 Type 2 diabetes mellitus with diabetic neuropathy, unspecified: Secondary | ICD-10-CM

## 2017-04-07 NOTE — Progress Notes (Signed)
Patient ID: Eduardo [name], male   DOB: 1955-12-14, 61 y.o.   MRN: [id number] Complaint:  Visit Type: Patient returns to my office for continued preventative foot care services. Complaint: Patient states" my nails have grown long and thick and become painful to walk and wear shoes" Patient has been diagnosed with DM with no foot complications. The patient presents for preventative foot care services. No changes to ROS  Podiatric Exam: Vascular: dorsalis pedis and posterior tibial pulses are palpable bilateral. Capillary return is immediate. Temperature gradient is WNL. Skin turgor WNL  Sensorium: Abnormal  Semmes Weinstein monofilament test. Normal tactile sensation bilaterally. Nail Exam: Pt has thick disfigured discolored nails with subungual debris noted bilateral entire nail hallux  Ulcer Exam: There is no evidence of ulcer or pre-ulcerative changes or infection. Orthopedic Exam: Muscle tone and strength are WNL. No limitations in general ROM. No crepitus or effusions noted. Foot type and digits show no abnormalities. Bony prominences are unremarkable. Skin: No Porokeratosis. No infection or ulcers  Diagnosis:  Onychomycosis, , Pain in right toe, pain in left toes  Treatment & Plan Procedures and Treatment: Consent by patient was obtained for treatment procedures. The patient understood the discussion of treatment and procedures well. All questions were answered thoroughly reviewed. Debridement of mycotic and hypertrophic toenails, 1 through 5 bilateral and clearing of subungual debris. No ulceration, no infection noted. Patient had nail pulled off third toe left and is healing uneventfully. Return Visit-Office Procedure: Patient instructed to return to the office for a follow up visit 3 months for continued evaluation and treatment.    Gardiner Barefoot DPM

## 2017-04-12 ENCOUNTER — Encounter (INDEPENDENT_AMBULATORY_CARE_PROVIDER_SITE_OTHER): Payer: BLUE CROSS/BLUE SHIELD | Admitting: Ophthalmology

## 2017-04-12 DIAGNOSIS — E113512 Type 2 diabetes mellitus with proliferative diabetic retinopathy with macular edema, left eye: Secondary | ICD-10-CM | POA: Diagnosis not present

## 2017-04-12 DIAGNOSIS — E11311 Type 2 diabetes mellitus with unspecified diabetic retinopathy with macular edema: Secondary | ICD-10-CM | POA: Diagnosis not present

## 2017-04-12 DIAGNOSIS — E113391 Type 2 diabetes mellitus with moderate nonproliferative diabetic retinopathy without macular edema, right eye: Secondary | ICD-10-CM | POA: Diagnosis not present

## 2017-04-12 DIAGNOSIS — H35033 Hypertensive retinopathy, bilateral: Secondary | ICD-10-CM | POA: Diagnosis not present

## 2017-04-12 DIAGNOSIS — H43813 Vitreous degeneration, bilateral: Secondary | ICD-10-CM | POA: Diagnosis not present

## 2017-04-12 DIAGNOSIS — I10 Essential (primary) hypertension: Secondary | ICD-10-CM

## 2017-05-10 ENCOUNTER — Encounter (INDEPENDENT_AMBULATORY_CARE_PROVIDER_SITE_OTHER): Payer: BLUE CROSS/BLUE SHIELD | Admitting: Ophthalmology

## 2017-05-10 DIAGNOSIS — E113391 Type 2 diabetes mellitus with moderate nonproliferative diabetic retinopathy without macular edema, right eye: Secondary | ICD-10-CM | POA: Diagnosis not present

## 2017-05-10 DIAGNOSIS — H35033 Hypertensive retinopathy, bilateral: Secondary | ICD-10-CM | POA: Diagnosis not present

## 2017-05-10 DIAGNOSIS — H43813 Vitreous degeneration, bilateral: Secondary | ICD-10-CM

## 2017-05-10 DIAGNOSIS — I10 Essential (primary) hypertension: Secondary | ICD-10-CM

## 2017-05-10 DIAGNOSIS — E11311 Type 2 diabetes mellitus with unspecified diabetic retinopathy with macular edema: Secondary | ICD-10-CM | POA: Diagnosis not present

## 2017-05-10 DIAGNOSIS — E113512 Type 2 diabetes mellitus with proliferative diabetic retinopathy with macular edema, left eye: Secondary | ICD-10-CM | POA: Diagnosis not present

## 2017-05-23 DIAGNOSIS — I25118 Atherosclerotic heart disease of native coronary artery with other forms of angina pectoris: Secondary | ICD-10-CM | POA: Insufficient documentation

## 2017-05-23 DIAGNOSIS — I25810 Atherosclerosis of coronary artery bypass graft(s) without angina pectoris: Secondary | ICD-10-CM | POA: Insufficient documentation

## 2017-05-23 DIAGNOSIS — I2510 Atherosclerotic heart disease of native coronary artery without angina pectoris: Secondary | ICD-10-CM | POA: Insufficient documentation

## 2017-05-23 NOTE — Progress Notes (Addendum)
Chief Complaint  Patient presents with  . Annual Exam    fasting annual exam, had labs at [facility] office yesterday. Recently has eye exam with Dr.Matthews. No concerns.     Eduardo [name] is a 61 y.o. male who presents for a complete physical.  He is under the care of many other doctors, and limited records received (only from Dr. Einar Gip).  He is a good historian and provides many updates regarding his history and health.  He has no specific concerns today.  He had a recent boil on his right posterior/inner high that popped on its own and is healing.  He avoids wearing jeans as this contributes to irritation of the skin and the formation of these infections.  He is under the care of  Dr. Chalmers Cater for Diabetes. His last visit was in January (we have no records from her) and he reports A1c was 7.3 (down from 8.9 last summer).  Some dose adjustments were made to his Humulin N, dose reduced 40/20/40 with meals.  Using humalog sliding scale (typically 5-8 units with meals).  He has had some hypoglycemic symptoms, with sugars in 60's, 3 times in the last 6 months.  Sugar is doing better overall--since doing weight watchers, losing weight, better portion control and better food choices. Under the care of Dr. Zigmund Daniel for diabetic retinopathy.  He reports macular edema in the left eye (dx'd 03/2017), with injections x 2 in June and July. Has f/u in August scheduled. He reports peripheral neuropathy with numbness in his feet, but he no longer has any pain.  He goes to [facility] for toenail care and foot exams. He recently lost the toenail off the left 5th toe.  He also previously was treated by Dr. Chalmers Cater for hypogonadism.  He stopped testosterone therapy 5-6 years ago, concern for risks.  Unsure of when last level was checked. He has had problems with fatigue, erectile problems and libido, which has improved some with weight loss.  He started at [employer] since January, initial weight was 336#.  He  has known PAD (and has seen Dr. Scot Dock, last in 06/2016), scheduled for a 2 yr f/u--has been stable, no change.  Denies any claudication. He has been getting regular exercise. He saw Dr. Einar Gip for cardiac evaluation in October 2017, prior to starting an exercise routine. Stress test was abnormal. Echo showed LVH, and decreased LV function, EF approx 40%, and showed mild global hypokinesis, grade 1 diastolic dysfunction. He also had abnormal carotid US, showing occlusion of L ICA in 08/2016 (per Dr. Irven Shelling records, I don't see results in chart). Results of cardiac cath done by Dr. Einar Gip: Coronary angiogram 09/08/2016: 1. Low normal LV systolic function, EF 76-73% with apical akinesis to dyskinesis. 2. Moderate coronary artery calcification, diffuse 3. Mid LAD occluded and collateralized by faint ipsilateral and contralateral collaterals from RCA. Diagonal 1 is moderate sized with ostial 30% stenosis, diagonal 2 is small to moderate sized and bifurcates, proximal segment has 80% stenosis 4. Moderate to circumflex, giving origin to moderate sized OM 1 which bifurcates. OM1 is occluded and has ipsilateral collaterals. 5. Large RCA. Dominant. Proximal 30%. Calcified. Gives origin to 4 PL branches, PL 1 large with ostial 80% stenosis, PL 2 small, PLT 3 large and has a long segment occlusion and collaterals from both left and ipsilateral collaterals. PL 4 is moderate-sized mild disease. 6. Patent LIMA and RIMA.  Lots of med changes were made, for medical management--started on beta blocker,  statin, nitrates.  He had some headache and fatigue as side effects from medications.  He saw cardiologist again in March, and he had lost weight, DOE had improved.  Labs were drawn, but results not received here.   Headaches from nitrates resolved.   Walks 1 mile in the morning 4-5 days/week, gym 2-3x/week for 30-45 minutes for weights, elliptical, planks/squats.  Denies chest pain, shortness of breath, claudication with  exercise.  Had injection for Dupuytren's contracture of right hand by Dr. Lenon Curt in the past. Didn't find it very effective.  He has some ongoing problems with contractures--Right pinkie is fixed, decrease ROM. Having contracture of left ring finger developing as well.  Wears brace occasionally.  He no longer sees Dr. Lenon Curt.  Under the care of dermatologist, Dr. Renda Rolls, normal visit recently, nothing removed/treated.  Periodically he gets "pimples" in the inner thigh, self-resolves (as reported above).  Immunization History  Administered Date(s) Administered  . Influenza Split 08/10/2011  . Influenza,inj,Quad PF,36+ Mos 10/15/2016  . Pneumococcal Polysaccharide-23 09/28/2011  . Td 06/03/2005  . Tdap 09/28/2011   Last colonoscopy: 11/2014, Dr.Magod-ext and int hemorrhoids and diverticulosis.  (that was a 3 yr f/u, h/o polyps in past).  Due again 2021 Last PSA: 2012 Dentist: twice yearly Ophtho: regularly (due to retinopathy) Exercise: Walks 1 mile in the morning 4-5 days/week, gym 2-3x/week for 30-45 minutes for weights, elliptical, planks/squats.  Past Medical History:  Diagnosis Date  . Colon polyp   . Diabetes mellitus 1987   under care of Dr. Chalmers Cater.  On insulin since 96 (off and on)  . Diabetic retinopathy   . Dupuytren contracture    R hand, s/p injection (Dr. Lenon Curt)  . Essential hypertension, benign   . Frequency of urination and polyuria   . Hypertension   . Myocardial infarction ([place])   . Neuromuscular disorder ([place])    Diabetic neuropathy  . Other testicular hypofunction   . Peripheral vascular disease ([place])   . Peritoneal abscess ([place]) 6/08   and buttock.  . Polydipsia   . Proteinuria   . Pure hyperglyceridemia     Past Surgical History:  Procedure Laterality Date  . ABDOMINAL AORTAGRAM N/A 04/18/2012   Procedure: ABDOMINAL Maxcine Ham;  Surgeon: Angelia Mould, MD;  Location: [facility] CATH LAB;  Service: Cardiovascular;  Laterality: N/A;  . CARDIAC  CATHETERIZATION N/A 09/08/2016   Procedure: Left Heart Cath and Coronary Angiography;  Surgeon: Adrian Prows, MD;  Location: [facility] CV LAB;  Service: Cardiovascular;  Laterality: N/A;  . LOWER EXTREMITY ANGIOGRAM Bilateral 04/18/2012   Procedure: LOWER EXTREMITY ANGIOGRAM;  Surgeon: Angelia Mould, MD;  Location: [facility] CATH LAB;  Service: Cardiovascular;  Laterality: Bilateral;  bilat lower extrem angio  . macular photocoagulation     (eye treatments for diabetic retinopathy)-Dr. Zigmund Daniel    Social History   Social History  . Marital status: Married    Spouse name: N/A  . Number of children: 0  . Years of education: N/A   Occupational History  . 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 Smoking status: Former Smoker    Packs/day: 1.00    Years: 30.00    Types: Cigarettes    Quit date: 01/08/2012  . Smokeless tobacco: Never Used  . Alcohol use No  . Drug use: No  . Sexual activity: Not Currently   Other Topics Concern  . Not on file   Social History Narrative   Widowed.  Previously traveled 40 weeks out of the year. No longer travels as much. No pets    Family History  Problem Relation Age of Onset  . Diabetes Mother   . Hearing loss Mother   . Hypertension Mother   . Hyperlipidemia Mother   . Heart disease Mother   . Varicose Veins Mother   . Varicose Veins Father   . Dementia Father   . Hyperlipidemia Brother   . Diabetes Maternal Grandmother     Outpatient Encounter Prescriptions as of 05/25/2017  Medication Sig Note  . aspirin EC 81 MG tablet Take 81 mg by mouth daily. 09/04/2016: (Heart health)  . BESIVANCE 0.6 % SUSP  05/25/2017: Day of and day after eye injections.   Marland Kitchen Bioflavonoid Products (ESTER-C) 1000-50 MG TABS Take 1 tablet by mouth daily.    . Cholecalciferol (VITAMIN D) 2000 units CAPS Take 2,000 Units by mouth daily.   . clopidogrel (PLAVIX) 75 MG tablet Take 75 mg by mouth daily.   Marland Kitchen  gemfibrozil (LOPID) 600 MG tablet Take 600 mg by mouth 2 (two) times daily before a meal.     . HUMALOG KWIKPEN 100 UNIT/ML KiwkPen Inject 0-40 Units into the skin 3 (three) times daily with meals. SLIDING SCALE DEPENDING ON BLOOD GLUCOSE BEFORE MEAL 05/25/2017: Usually 0-8 units with meals  . HUMULIN N KWIKPEN 100 UNIT/ML Kiwkpen Inject 60 Units into the skin 3 (three) times daily. 05/25/2017: Current dose 40/20/40  . isosorbide mononitrate (IMDUR) 60 MG 24 hr tablet Take 60 mg by mouth daily.   . metoprolol succinate (TOPROL-XL) 100 MG 24 hr tablet Take 100 mg by mouth daily. Take with or immediately following a meal.   . Multiple Vitamin (MULTIVITAMIN) tablet Take 1 tablet by mouth daily.   . Probiotic Product (PROBIOTIC-10 PO) Take 1 capsule by mouth daily.   . rosuvastatin (CRESTOR) 20 MG tablet Take 20 mg by mouth daily.   . [DISCONTINUED] CARTIA XT 120 MG 24 hr capsule Take 120 mg by mouth daily.    . fluticasone (FLONASE) 50 MCG/ACT nasal spray Place 2 sprays into both nostrils daily. (Patient not taking: Reported on 05/25/2017)   . nitroGLYCERIN (NITROSTAT) 0.4 MG SL tablet Place 0.4 mg under the tongue every 5 (five) minutes as needed for chest pain. 09/04/2016: NEVER USED  . [DISCONTINUED] metoprolol (LOPRESSOR) 50 MG tablet Take 50 mg by mouth 2 (two) times daily.    No facility-administered encounter medications on file as of 05/25/2017.     No Known Allergies  ROS:  The patient denies anorexia, fever, headaches, decreased hearing, ear pain, hoarseness, chest pain, palpitations, dizziness, syncope, dyspnea on exertion, cough, swelling, nausea, vomiting, diarrhea, constipation, abdominal pain, melena, hematochezia, indigestion/heartburn, hematuria, incontinence, nocturia, weakened urine stream, dysuria, genital lesions, joint pains, weakness, tremor, suspicious skin lesions, depression, anxiety, abnormal bleeding/bruising, or enlarged lymph nodes +intentional weight loss with [employer] Some ED  and decreased libido, improving with weight loss. Numbness in feet, no pain or sores. Some bruising related to plavix and aspirin, mild, related to trauma. No current skin concerns (recent boil/pimple at inner thigh that has resolved).   PHYSICAL EXAM:  BP 132/80 (BP Location: Left Arm, Patient Position: Sitting, Cuff Size: Normal)   Pulse 80   Ht _0  (1.753 m)   Wt 298 lb 3.2 oz (135.3 kg)   BMI 44.04 kg/m    Wt Readings from Last 3 Encounters:  05/25/17 298 lb 3.2 oz (135.3 kg)  10/15/16 (!) 341 lb 3.2 oz (  154.8 kg)  10/14/16 (!) 333 lb (151 kg)   BP 132/80 (BP Location: Left Arm, Patient Position: Sitting, Cuff Size: Normal)   Pulse 80   Ht _0  (1.753 m)   Wt 298 lb 3.2 oz (135.3 kg)   BMI 44.04 kg/m   General Appearance:    Alert, cooperative, no distress, appears stated age  Head:    Normocephalic, without obvious abnormality, atraumatic  Eyes:    PERRL, conjunctiva/corneas clear, EOM's intact, fundi not visualized  Ears:    Normal TM's and external ear canals  Nose:   Nares normal, mucosa normal, no drainage or sinus   tenderness  Throat:   Lips, mucosa, and tongue normal; teeth and gums normal  Neck:   Supple, no lymphadenopathy;  thyroid:  no   enlargement/tenderness/nodules; no carotid bruit or JVD  Back:    Spine nontender, no curvature, ROM normal, no CVA     tenderness  Lungs:     Clear to auscultation bilaterally without wheezes, rales or     ronchi; respirations unlabored  Chest Wall:    No tenderness or deformity   Heart:    Regular rate and rhythm, S1 and S2 normal, no murmur, rub   or gallop  Breast Exam:    No chest wall tenderness, or masses   Abdomen:     Soft, non-tender, obese; nondistended, normoactive bowel sounds, no masses, no hepatosplenomegaly  Genitalia:    Normal male external genitalia without lesions.  Testicles without masses.  No inguinal hernias.  Rectal:    Normal sphincter tone, no masses or tenderness; guaiac negative stool.   Prostate smooth, no nodules, not enlarged.  Extremities:   No clubbing, cyanosis; Trace ankle edema  Pulses:   Diminished but palpable distal pulses in LE's  Skin:   Turgor normal, no suspicious lesions. Posterior upper thighs with some hyperpigmentation, slightly pink. Area of recent healing boil on right posterior thigh, nontender.  Lymph nodes:   Cervical, supraclavicular, and axillary nodes normal  Neurologic:   CNII-XII intact, normal strength, sensation and gait; reflexes 2+ and symmetric throughout. Decreased monofilament sensation in both feet (normal in central toes and heels.          Psych:   Normal mood, affect, hygiene and grooming.    ASSESSMENT/PLAN:  Annual physical exam - Plan: TSH, Hepatitis C antibody, PSA, Pneumococcal conjugate vaccine 13-valent  Peripheral vascular disease, unspecified ([place]) - stable, asymptomatic. Continue regular exercise and current meds  Hypertension associated with diabetes ([place]) - controlled on current regimen  Atherosclerosis of native artery of lower extremity with intermittent claudication, unspecified laterality ([place]) - controlled; continue current med regimen  Diabetic retinopathy associated with type 2 diabetes mellitus, macular edema presence unspecified, unspecified laterality, unspecified retinopathy severity ([place])  3-vessel coronary artery disease - on medical management, asymptomatic  Male hypogonadism - symptoms improving with weight loss; encouraged continued weight loss - Plan: Testosterone  Screening for prostate cancer - Plan: PSA  Type 2 diabetes, controlled, with peripheral neuropathy ([place]) - improving control with weight loss and improved diet. Get lab results drawn yest from Dr. Chalmers Cater. Cont foot care - Plan: TSH, Pneumococcal conjugate vaccine 13-valent  Class 3 obesity due to excess calories with serious comorbidity and body mass index (BMI) of 40.0 to 44.9 in adult [facility]) - congratulated on weight loss thus far and regular  exercise--continue   Hypogonadism--Symptoms improving since losing weight.  Check level today, but not interested in testosterone replacement.  Continue with weight loss.  Hep C, PSA, testosterone (new baseline), TSH Prevnar-13 today Pneumovax at age 1 shingrix when available (manufacturer backorder)  Get results from Dr. Chalmers Cater (labs drawn yesterday, including urine microalb, b-met, A1c and lipids)    Now that you are on a statin, see if you truly need your current, twice daily dose of gemfibrozil. It is possible that with your improved diet and diabetes control, plus now being on a statin, that you may not need (or need as much) the triglyceride medication.  Mention this when Dr. Chalmers Cater reviews your results with you (I know she checked your lipids, I don't have the results).   ADDENDUM: Notes/labs from Dr. Chalmers Cater reviewed: A1c 7.1; Cr 1.6, abnl alb/Cr 1728.5; chol 92, LDL 42, HDL 36, TG 72. Referred to nephro. She cont'd gemfibrozil

## 2017-05-24 LAB — LIPID PANEL
CHOLESTEROL: 92 (ref 0–200)
HDL: 36 (ref 35–70)
LDL Cholesterol: 42
TRIGLYCERIDES: 72 (ref 40–160)

## 2017-05-24 LAB — HEMOGLOBIN A1C: Hemoglobin A1C: 7.1

## 2017-05-25 ENCOUNTER — Ambulatory Visit (INDEPENDENT_AMBULATORY_CARE_PROVIDER_SITE_OTHER): Payer: BLUE CROSS/BLUE SHIELD | Admitting: Family Medicine

## 2017-05-25 ENCOUNTER — Encounter: Payer: Self-pay | Admitting: Family Medicine

## 2017-05-25 VITALS — BP 132/80 | HR 80 | Ht 69.0 in | Wt 298.2 lb

## 2017-05-25 DIAGNOSIS — I70219 Atherosclerosis of native arteries of extremities with intermittent claudication, unspecified extremity: Secondary | ICD-10-CM | POA: Diagnosis not present

## 2017-05-25 DIAGNOSIS — I152 Hypertension secondary to endocrine disorders: Secondary | ICD-10-CM

## 2017-05-25 DIAGNOSIS — E6609 Other obesity due to excess calories: Secondary | ICD-10-CM

## 2017-05-25 DIAGNOSIS — E11319 Type 2 diabetes mellitus with unspecified diabetic retinopathy without macular edema: Secondary | ICD-10-CM | POA: Diagnosis not present

## 2017-05-25 DIAGNOSIS — I739 Peripheral vascular disease, unspecified: Secondary | ICD-10-CM

## 2017-05-25 DIAGNOSIS — Z125 Encounter for screening for malignant neoplasm of prostate: Secondary | ICD-10-CM | POA: Diagnosis not present

## 2017-05-25 DIAGNOSIS — I2510 Atherosclerotic heart disease of native coronary artery without angina pectoris: Secondary | ICD-10-CM | POA: Diagnosis not present

## 2017-05-25 DIAGNOSIS — E1142 Type 2 diabetes mellitus with diabetic polyneuropathy: Secondary | ICD-10-CM

## 2017-05-25 DIAGNOSIS — I10 Essential (primary) hypertension: Secondary | ICD-10-CM

## 2017-05-25 DIAGNOSIS — Z6841 Body Mass Index (BMI) 40.0 and over, adult: Secondary | ICD-10-CM

## 2017-05-25 DIAGNOSIS — E291 Testicular hypofunction: Secondary | ICD-10-CM

## 2017-05-25 DIAGNOSIS — E1159 Type 2 diabetes mellitus with other circulatory complications: Secondary | ICD-10-CM | POA: Diagnosis not present

## 2017-05-25 DIAGNOSIS — Z23 Encounter for immunization: Secondary | ICD-10-CM

## 2017-05-25 DIAGNOSIS — Z0000 Encounter for general adult medical examination without abnormal findings: Secondary | ICD-10-CM | POA: Diagnosis not present

## 2017-05-25 DIAGNOSIS — IMO0001 Reserved for inherently not codable concepts without codable children: Secondary | ICD-10-CM

## 2017-05-25 LAB — TSH: TSH: 1.05 mIU/L (ref 0.40–4.50)

## 2017-05-25 NOTE — Patient Instructions (Addendum)
  HEALTH MAINTENANCE RECOMMENDATIONS:  It is recommended that you get at least 30 minutes of aerobic exercise at least 5 days/week (for weight loss, you may need as much as 60-90 minutes). This can be any activity that gets your heart rate up. This can be divided in 10-15 minute intervals if needed, but try and build up your endurance at least once a week.  Weight bearing exercise is also recommended twice weekly.  Eat a healthy diet with lots of vegetables, fruits and fiber.  "Colorful" foods have a lot of vitamins (ie green vegetables, tomatoes, red peppers, etc).  Limit sweet tea, regular sodas and alcoholic beverages, all of which has a lot of calories and sugar.  Up to 2 alcoholic drinks daily may be beneficial for men (unless trying to lose weight, watch sugars).  Drink a lot of water.  Sunscreen of at least SPF 30 should be used on all sun-exposed parts of the skin when outside between the hours of 10 am and 4 pm (not just when at beach or pool, but even with exercise, golf, tennis, and yard work!)  Use a sunscreen that says "broad spectrum" so it covers both UVA and UVB rays, and make sure to reapply every 1-2 hours.  Remember to change the batteries in your smoke detectors when changing your clock times in the spring and fall.  Use your seat belt every time you are in a car, and please drive safely and not be distracted with cell phones and texting while driving.   Now that you are on a statin, see if you truly need your current, twice daily dose of gemfibrozil. It is possible that with your improved diet and diabetes control, plus now being on a statin, that you may not need (or need as much) the triglyceride medication.  Mention this when Dr. Chalmers Cater reviews your results with you (I know she checked your lipids, I don't have the results).  Keep up the good work as far as diet and weight loss.  Have your other doctors (Dr. Chalmers Cater and Zigmund Daniel) send me copies of their reports.

## 2017-05-26 LAB — HEPATITIS C ANTIBODY: HCV Ab: NEGATIVE

## 2017-05-26 LAB — PSA: PSA: 2.3 ng/mL (ref <=4.0)

## 2017-05-26 LAB — TESTOSTERONE: TESTOSTERONE: 267 ng/dL (ref 250–827)

## 2017-06-02 ENCOUNTER — Encounter: Payer: Self-pay | Admitting: Family Medicine

## 2017-06-03 ENCOUNTER — Encounter: Payer: Self-pay | Admitting: Family Medicine

## 2017-06-18 ENCOUNTER — Encounter (INDEPENDENT_AMBULATORY_CARE_PROVIDER_SITE_OTHER): Payer: BLUE CROSS/BLUE SHIELD | Admitting: Ophthalmology

## 2017-06-18 DIAGNOSIS — H35033 Hypertensive retinopathy, bilateral: Secondary | ICD-10-CM | POA: Diagnosis not present

## 2017-06-18 DIAGNOSIS — I10 Essential (primary) hypertension: Secondary | ICD-10-CM | POA: Diagnosis not present

## 2017-06-18 DIAGNOSIS — E113391 Type 2 diabetes mellitus with moderate nonproliferative diabetic retinopathy without macular edema, right eye: Secondary | ICD-10-CM

## 2017-06-18 DIAGNOSIS — H43813 Vitreous degeneration, bilateral: Secondary | ICD-10-CM | POA: Diagnosis not present

## 2017-06-18 DIAGNOSIS — E11311 Type 2 diabetes mellitus with unspecified diabetic retinopathy with macular edema: Secondary | ICD-10-CM

## 2017-06-18 DIAGNOSIS — E113512 Type 2 diabetes mellitus with proliferative diabetic retinopathy with macular edema, left eye: Secondary | ICD-10-CM

## 2017-06-18 LAB — HM DIABETES EYE EXAM

## 2017-06-23 ENCOUNTER — Encounter: Payer: Self-pay | Admitting: *Deleted

## 2017-07-06 LAB — HM DIABETES EYE EXAM

## 2017-07-14 ENCOUNTER — Ambulatory Visit: Payer: BLUE CROSS/BLUE SHIELD | Admitting: Podiatry

## 2017-07-21 ENCOUNTER — Ambulatory Visit (INDEPENDENT_AMBULATORY_CARE_PROVIDER_SITE_OTHER): Payer: BLUE CROSS/BLUE SHIELD | Admitting: Podiatry

## 2017-07-21 ENCOUNTER — Encounter: Payer: Self-pay | Admitting: Podiatry

## 2017-07-21 DIAGNOSIS — E1140 Type 2 diabetes mellitus with diabetic neuropathy, unspecified: Secondary | ICD-10-CM | POA: Diagnosis not present

## 2017-07-21 DIAGNOSIS — M79676 Pain in unspecified toe(s): Secondary | ICD-10-CM

## 2017-07-21 DIAGNOSIS — B351 Tinea unguium: Secondary | ICD-10-CM

## 2017-07-21 NOTE — Progress Notes (Signed)
Patient ID: Eduardo [name], male   DOB: [birth year], 61 y.o.   MRN: [id number] Complaint:  Visit Type: Patient returns to my office for continued preventative foot care services. Complaint: Patient states" my nails have grown long and thick and become painful to walk and wear shoes" Patient has been diagnosed with DM with no foot complications. The patient presents for preventative foot care services. No changes to ROS.  He says some nails grew long and he cut the nails himself.  Podiatric Exam: Vascular: dorsalis pedis and posterior tibial pulses are palpable bilateral. Capillary return is immediate. Temperature gradient is WNL. Skin turgor WNL  Sensorium: Abnormal  Semmes Weinstein monofilament test. Normal tactile sensation bilaterally. Nail Exam: Pt has thick disfigured discolored nails with subungual debris noted bilateral entire nail hallux  Ulcer Exam: There is no evidence of ulcer or pre-ulcerative changes or infection. Orthopedic Exam: Muscle tone and strength are WNL. No limitations in general ROM. No crepitus or effusions noted. Foot type and digits show no abnormalities. Bony prominences are unremarkable. Skin: No Porokeratosis. No infection or ulcers  Diagnosis:  Onychomycosis, , Pain in right toe, pain in left toes  Treatment & Plan Procedures and Treatment: Consent by patient was obtained for treatment procedures. The patient understood the discussion of treatment and procedures well. All questions were answered thoroughly reviewed. Debridement of mycotic and hypertrophic toenails, 1 through 5 bilateral and clearing of subungual debris. No ulceration, no infection noted.  Return Visit-Office Procedure: Patient instructed to return to the office for a follow up visit 10 weeks for continued evaluation and treatment.    Gardiner Barefoot DPM

## 2017-08-13 ENCOUNTER — Encounter (INDEPENDENT_AMBULATORY_CARE_PROVIDER_SITE_OTHER): Payer: BLUE CROSS/BLUE SHIELD | Admitting: Ophthalmology

## 2017-08-13 DIAGNOSIS — H35033 Hypertensive retinopathy, bilateral: Secondary | ICD-10-CM | POA: Diagnosis not present

## 2017-08-13 DIAGNOSIS — E11311 Type 2 diabetes mellitus with unspecified diabetic retinopathy with macular edema: Secondary | ICD-10-CM | POA: Diagnosis not present

## 2017-08-13 DIAGNOSIS — I10 Essential (primary) hypertension: Secondary | ICD-10-CM

## 2017-08-13 DIAGNOSIS — H43813 Vitreous degeneration, bilateral: Secondary | ICD-10-CM

## 2017-08-13 DIAGNOSIS — E113391 Type 2 diabetes mellitus with moderate nonproliferative diabetic retinopathy without macular edema, right eye: Secondary | ICD-10-CM | POA: Diagnosis not present

## 2017-08-13 DIAGNOSIS — E113512 Type 2 diabetes mellitus with proliferative diabetic retinopathy with macular edema, left eye: Secondary | ICD-10-CM | POA: Diagnosis not present

## 2017-08-25 ENCOUNTER — Other Ambulatory Visit: Payer: Self-pay | Admitting: Nephrology

## 2017-08-25 DIAGNOSIS — N183 Chronic kidney disease, stage 3 unspecified: Secondary | ICD-10-CM

## 2017-08-30 ENCOUNTER — Ambulatory Visit
Admission: RE | Admit: 2017-08-30 | Discharge: 2017-08-30 | Disposition: A | Discharge status: Home / Self Care | Payer: BLUE CROSS/BLUE SHIELD | Source: Ambulatory Visit | Attending: Nephrology | Admitting: Nephrology

## 2017-08-30 DIAGNOSIS — N183 Chronic kidney disease, stage 3 unspecified: Secondary | ICD-10-CM

## 2017-09-02 ENCOUNTER — Encounter: Payer: Self-pay | Admitting: Family Medicine

## 2017-09-09 ENCOUNTER — Encounter: Payer: Self-pay | Admitting: *Deleted

## 2017-09-09 LAB — HM DIABETES EYE EXAM

## 2017-09-29 ENCOUNTER — Ambulatory Visit (INDEPENDENT_AMBULATORY_CARE_PROVIDER_SITE_OTHER): Payer: BLUE CROSS/BLUE SHIELD | Admitting: Podiatry

## 2017-09-29 ENCOUNTER — Encounter: Payer: Self-pay | Admitting: Podiatry

## 2017-09-29 DIAGNOSIS — D689 Coagulation defect, unspecified: Secondary | ICD-10-CM

## 2017-09-29 DIAGNOSIS — E1140 Type 2 diabetes mellitus with diabetic neuropathy, unspecified: Secondary | ICD-10-CM | POA: Diagnosis not present

## 2017-09-29 DIAGNOSIS — B351 Tinea unguium: Secondary | ICD-10-CM | POA: Diagnosis not present

## 2017-09-29 DIAGNOSIS — M79676 Pain in unspecified toe(s): Secondary | ICD-10-CM

## 2017-09-29 NOTE — Progress Notes (Signed)
Patient ID: Eduardo [name], male   DOB: 10/13/1956, 61 y.o.   MRN: [id number] Complaint:  Visit Type: Patient returns to my office for continued preventative foot care services. Complaint: Patient states" my nails have grown long and thick and become painful to walk and wear shoes" Patient has been diagnosed with DM with no foot complications. The patient presents for preventative foot care services. No changes to ROS.  Patient is on plavix.  Podiatric Exam: Vascular: dorsalis pedis and posterior tibial pulses are palpable bilateral. Capillary return is immediate. Temperature gradient is WNL. Skin turgor WNL  Sensorium: Abnormal  Semmes Weinstein monofilament test. Normal tactile sensation bilaterally. Nail Exam: Pt has thick disfigured discolored nails with subungual debris noted bilateral entire nail hallux  Ulcer Exam: There is no evidence of ulcer or pre-ulcerative changes or infection. Orthopedic Exam: Muscle tone and strength are WNL. No limitations in general ROM. No crepitus or effusions noted. Foot type and digits show no abnormalities. Bony prominences are unremarkable. Skin: No Porokeratosis. No infection or ulcers  Diagnosis:  Onychomycosis, , Pain in right toe, pain in left toes  Treatment & Plan Procedures and Treatment: Consent by patient was obtained for treatment procedures. The patient understood the discussion of treatment and procedures well. All questions were answered thoroughly reviewed. Debridement of mycotic and hypertrophic toenails, 1 through 5 bilateral and clearing of subungual debris. No ulceration, no infection noted.  Return Visit-Office Procedure: Patient instructed to return to the office for a follow up visit 10 weeks for continued evaluation and treatment.    Gardiner Barefoot DPM

## 2017-10-06 ENCOUNTER — Encounter: Payer: Self-pay | Admitting: Family Medicine

## 2017-10-06 DIAGNOSIS — N183 Chronic kidney disease, stage 3 unspecified: Secondary | ICD-10-CM | POA: Insufficient documentation

## 2017-10-06 DIAGNOSIS — N1832 Chronic kidney disease, stage 3b: Secondary | ICD-10-CM | POA: Insufficient documentation

## 2017-10-06 DIAGNOSIS — E1165 Type 2 diabetes mellitus with hyperglycemia: Secondary | ICD-10-CM | POA: Insufficient documentation

## 2017-10-06 DIAGNOSIS — R809 Proteinuria, unspecified: Secondary | ICD-10-CM | POA: Insufficient documentation

## 2017-10-06 DIAGNOSIS — E1121 Type 2 diabetes mellitus with diabetic nephropathy: Secondary | ICD-10-CM | POA: Insufficient documentation

## 2017-10-08 ENCOUNTER — Encounter (INDEPENDENT_AMBULATORY_CARE_PROVIDER_SITE_OTHER): Payer: BLUE CROSS/BLUE SHIELD | Admitting: Ophthalmology

## 2017-10-11 ENCOUNTER — Encounter (INDEPENDENT_AMBULATORY_CARE_PROVIDER_SITE_OTHER): Payer: BLUE CROSS/BLUE SHIELD | Admitting: Ophthalmology

## 2017-10-11 DIAGNOSIS — I10 Essential (primary) hypertension: Secondary | ICD-10-CM | POA: Diagnosis not present

## 2017-10-11 DIAGNOSIS — E113512 Type 2 diabetes mellitus with proliferative diabetic retinopathy with macular edema, left eye: Secondary | ICD-10-CM | POA: Diagnosis not present

## 2017-10-11 DIAGNOSIS — E113391 Type 2 diabetes mellitus with moderate nonproliferative diabetic retinopathy without macular edema, right eye: Secondary | ICD-10-CM | POA: Diagnosis not present

## 2017-10-11 DIAGNOSIS — H2511 Age-related nuclear cataract, right eye: Secondary | ICD-10-CM

## 2017-10-11 DIAGNOSIS — E11311 Type 2 diabetes mellitus with unspecified diabetic retinopathy with macular edema: Secondary | ICD-10-CM | POA: Diagnosis not present

## 2017-10-11 DIAGNOSIS — H35033 Hypertensive retinopathy, bilateral: Secondary | ICD-10-CM | POA: Diagnosis not present

## 2017-10-11 DIAGNOSIS — H43813 Vitreous degeneration, bilateral: Secondary | ICD-10-CM | POA: Diagnosis not present

## 2017-12-13 ENCOUNTER — Ambulatory Visit: Payer: BLUE CROSS/BLUE SHIELD | Admitting: Podiatry

## 2017-12-14 ENCOUNTER — Encounter: Payer: Self-pay | Admitting: Podiatry

## 2017-12-14 ENCOUNTER — Ambulatory Visit (INDEPENDENT_AMBULATORY_CARE_PROVIDER_SITE_OTHER): Payer: BLUE CROSS/BLUE SHIELD | Admitting: Podiatry

## 2017-12-14 DIAGNOSIS — E1140 Type 2 diabetes mellitus with diabetic neuropathy, unspecified: Secondary | ICD-10-CM | POA: Diagnosis not present

## 2017-12-14 DIAGNOSIS — D689 Coagulation defect, unspecified: Secondary | ICD-10-CM

## 2017-12-14 DIAGNOSIS — M79676 Pain in unspecified toe(s): Secondary | ICD-10-CM

## 2017-12-14 DIAGNOSIS — B351 Tinea unguium: Secondary | ICD-10-CM

## 2017-12-14 NOTE — Progress Notes (Signed)
Patient ID: Eduardo [name], male   DOB: 12/19/1955, 61 y.o.   MRN: [id number] Complaint:  Visit Type: Patient returns to my office for continued preventative foot care services. Complaint: Patient states" my nails have grown long and thick and become painful to walk and wear shoes" Patient has been diagnosed with DM with no foot complications. The patient presents for preventative foot care services. No changes to ROS.  Patient is on plavix.  Podiatric Exam: Vascular: dorsalis pedis and posterior tibial pulses are palpable bilateral. Capillary return is immediate. Temperature gradient is WNL. Skin turgor WNL  Sensorium: Abnormal  Semmes Weinstein monofilament test. Normal tactile sensation bilaterally. Nail Exam: Pt has thick disfigured discolored nails with subungual debris noted bilateral entire nail hallux  Ulcer Exam: There is no evidence of ulcer or pre-ulcerative changes or infection. Orthopedic Exam: Muscle tone and strength are WNL. No limitations in general ROM. No crepitus or effusions noted. Foot type and digits show no abnormalities. Bony prominences are unremarkable. Skin: No Porokeratosis. No infection or ulcers  Diagnosis:  Onychomycosis, , Pain in right toe, pain in left toes  Treatment & Plan Procedures and Treatment: Consent by patient was obtained for treatment procedures. The patient understood the discussion of treatment and procedures well. All questions were answered thoroughly reviewed. Debridement of mycotic and hypertrophic toenails, 1 through 5 bilateral and clearing of subungual debris. No ulceration, no infection noted.  Return Visit-Office Procedure: Patient instructed to return to the office for a follow up visit 10 weeks for continued evaluation and treatment.    Dominick Morella DPM 

## 2017-12-20 ENCOUNTER — Encounter (INDEPENDENT_AMBULATORY_CARE_PROVIDER_SITE_OTHER): Payer: BLUE CROSS/BLUE SHIELD | Admitting: Ophthalmology

## 2017-12-20 DIAGNOSIS — I10 Essential (primary) hypertension: Secondary | ICD-10-CM | POA: Diagnosis not present

## 2017-12-20 DIAGNOSIS — H43813 Vitreous degeneration, bilateral: Secondary | ICD-10-CM | POA: Diagnosis not present

## 2017-12-20 DIAGNOSIS — E113512 Type 2 diabetes mellitus with proliferative diabetic retinopathy with macular edema, left eye: Secondary | ICD-10-CM | POA: Diagnosis not present

## 2017-12-20 DIAGNOSIS — H35033 Hypertensive retinopathy, bilateral: Secondary | ICD-10-CM

## 2017-12-20 DIAGNOSIS — E113391 Type 2 diabetes mellitus with moderate nonproliferative diabetic retinopathy without macular edema, right eye: Secondary | ICD-10-CM | POA: Diagnosis not present

## 2017-12-20 DIAGNOSIS — E11311 Type 2 diabetes mellitus with unspecified diabetic retinopathy with macular edema: Secondary | ICD-10-CM | POA: Diagnosis not present

## 2018-01-31 ENCOUNTER — Encounter: Payer: Self-pay | Admitting: Family Medicine

## 2018-02-07 DIAGNOSIS — N183 Chronic kidney disease, stage 3 (moderate): Secondary | ICD-10-CM | POA: Diagnosis not present

## 2018-02-07 DIAGNOSIS — I129 Hypertensive chronic kidney disease with stage 1 through stage 4 chronic kidney disease, or unspecified chronic kidney disease: Secondary | ICD-10-CM | POA: Diagnosis not present

## 2018-02-16 DIAGNOSIS — I6522 Occlusion and stenosis of left carotid artery: Secondary | ICD-10-CM | POA: Diagnosis not present

## 2018-02-23 ENCOUNTER — Ambulatory Visit: Payer: BLUE CROSS/BLUE SHIELD | Admitting: Podiatry

## 2018-02-23 ENCOUNTER — Ambulatory Visit (INDEPENDENT_AMBULATORY_CARE_PROVIDER_SITE_OTHER): Payer: BLUE CROSS/BLUE SHIELD | Admitting: Family Medicine

## 2018-02-23 ENCOUNTER — Encounter: Payer: Self-pay | Admitting: Family Medicine

## 2018-02-23 VITALS — BP 112/70 | HR 60 | Temp 97.3°F | Ht 69.25 in | Wt 270.2 lb

## 2018-02-23 DIAGNOSIS — J029 Acute pharyngitis, unspecified: Secondary | ICD-10-CM

## 2018-02-23 LAB — POCT RAPID STREP A (OFFICE): Rapid Strep A Screen: NEGATIVE

## 2018-02-23 NOTE — Progress Notes (Signed)
   Subjective:    Patient ID: Eduardo [name], male    DOB: 07-21-1956, 62 y.o.   MRN: [id number]  HPI He states that on Sunday he developed sore throat, cough, nasal congestion, itchy watery eyes with slight fever,  chills, malaise and fatigue.  He does have underlying history of allergies.   Review of Systems     Objective:   Physical Exam Alert and in no distress. Tympanic membranes and canals are normal. Pharyngeal area is normal. Neck is supple without adenopathy or thyromegaly. Cardiac exam shows a regular sinus rhythm without murmurs or gallops. Lungs are clear to auscultation. Strep screen is negative      Assessment & Plan:  Sore throat - Plan: Rapid Strep A  I explained that the screen was negative and he deftly has a viral type of infection.  This could also beInfluenza but we would treat it the same way.  He was comfortable with that.

## 2018-02-25 ENCOUNTER — Ambulatory Visit: Payer: BLUE CROSS/BLUE SHIELD | Admitting: Podiatry

## 2018-03-01 ENCOUNTER — Encounter: Payer: Self-pay | Admitting: Podiatry

## 2018-03-01 ENCOUNTER — Ambulatory Visit (INDEPENDENT_AMBULATORY_CARE_PROVIDER_SITE_OTHER): Payer: BLUE CROSS/BLUE SHIELD | Admitting: Podiatry

## 2018-03-01 DIAGNOSIS — B351 Tinea unguium: Secondary | ICD-10-CM | POA: Diagnosis not present

## 2018-03-01 DIAGNOSIS — M79676 Pain in unspecified toe(s): Secondary | ICD-10-CM

## 2018-03-01 DIAGNOSIS — E1140 Type 2 diabetes mellitus with diabetic neuropathy, unspecified: Secondary | ICD-10-CM

## 2018-03-01 DIAGNOSIS — D689 Coagulation defect, unspecified: Secondary | ICD-10-CM

## 2018-03-01 NOTE — Progress Notes (Signed)
Patient ID: Eduardo [name], male   DOB: 08/30/1956, 61 y.o.   MRN: [id number] Complaint:  Visit Type: Patient returns to my office for continued preventative foot care services. Complaint: Patient states" my nails have grown long and thick and become painful to walk and wear shoes" Patient has been diagnosed with DM with no foot complications. The patient presents for preventative foot care services. No changes to ROS.  Patient is on plavix.  Podiatric Exam: Vascular: dorsalis pedis and posterior tibial pulses are palpable bilateral. Capillary return is immediate. Temperature gradient is WNL. Skin turgor WNL  Sensorium: Abnormal  Semmes Weinstein monofilament test. Normal tactile sensation bilaterally. Nail Exam: Pt has thick disfigured discolored nails with subungual debris noted bilateral entire nail hallux  Ulcer Exam: There is no evidence of ulcer or pre-ulcerative changes or infection. Orthopedic Exam: Muscle tone and strength are WNL. No limitations in general ROM. No crepitus or effusions noted. Foot type and digits show no abnormalities. Bony prominences are unremarkable. Skin: No Porokeratosis. No infection or ulcers  Diagnosis:  Onychomycosis, , Pain in right toe, pain in left toes  Treatment & Plan Procedures and Treatment: Consent by patient was obtained for treatment procedures. The patient understood the discussion of treatment and procedures well. All questions were answered thoroughly reviewed. Debridement of mycotic and hypertrophic toenails, 1 through 5 bilateral and clearing of subungual debris. No ulceration, no infection noted.  Return Visit-Office Procedure: Patient instructed to return to the office for a follow up visit 10 weeks for continued evaluation and treatment.    Kyndal Gloster DPM 

## 2018-03-23 DIAGNOSIS — D225 Melanocytic nevi of trunk: Secondary | ICD-10-CM | POA: Diagnosis not present

## 2018-03-23 DIAGNOSIS — L814 Other melanin hyperpigmentation: Secondary | ICD-10-CM | POA: Diagnosis not present

## 2018-03-23 DIAGNOSIS — L821 Other seborrheic keratosis: Secondary | ICD-10-CM | POA: Diagnosis not present

## 2018-03-23 DIAGNOSIS — D1801 Hemangioma of skin and subcutaneous tissue: Secondary | ICD-10-CM | POA: Diagnosis not present

## 2018-03-28 ENCOUNTER — Encounter (INDEPENDENT_AMBULATORY_CARE_PROVIDER_SITE_OTHER): Payer: BLUE CROSS/BLUE SHIELD | Admitting: Ophthalmology

## 2018-03-28 DIAGNOSIS — E113391 Type 2 diabetes mellitus with moderate nonproliferative diabetic retinopathy without macular edema, right eye: Secondary | ICD-10-CM | POA: Diagnosis not present

## 2018-03-28 DIAGNOSIS — I10 Essential (primary) hypertension: Secondary | ICD-10-CM

## 2018-03-28 DIAGNOSIS — E113592 Type 2 diabetes mellitus with proliferative diabetic retinopathy without macular edema, left eye: Secondary | ICD-10-CM

## 2018-03-28 DIAGNOSIS — E11319 Type 2 diabetes mellitus with unspecified diabetic retinopathy without macular edema: Secondary | ICD-10-CM | POA: Diagnosis not present

## 2018-03-28 DIAGNOSIS — H43813 Vitreous degeneration, bilateral: Secondary | ICD-10-CM

## 2018-03-28 DIAGNOSIS — H35033 Hypertensive retinopathy, bilateral: Secondary | ICD-10-CM

## 2018-04-29 DIAGNOSIS — H40011 Open angle with borderline findings, low risk, right eye: Secondary | ICD-10-CM | POA: Diagnosis not present

## 2018-05-04 ENCOUNTER — Ambulatory Visit (INDEPENDENT_AMBULATORY_CARE_PROVIDER_SITE_OTHER): Payer: BLUE CROSS/BLUE SHIELD | Admitting: Podiatry

## 2018-05-04 ENCOUNTER — Encounter: Payer: Self-pay | Admitting: Podiatry

## 2018-05-04 DIAGNOSIS — D689 Coagulation defect, unspecified: Secondary | ICD-10-CM | POA: Diagnosis not present

## 2018-05-04 DIAGNOSIS — E1140 Type 2 diabetes mellitus with diabetic neuropathy, unspecified: Secondary | ICD-10-CM

## 2018-05-04 DIAGNOSIS — B351 Tinea unguium: Secondary | ICD-10-CM | POA: Diagnosis not present

## 2018-05-04 DIAGNOSIS — M79676 Pain in unspecified toe(s): Secondary | ICD-10-CM

## 2018-05-04 NOTE — Progress Notes (Signed)
Patient ID: Eduardo [name], male   DOB: 1956/06/14, 62 y.o.   MRN: [id number] Complaint:  Visit Type: Patient returns to my office for continued preventative foot care services. Complaint: Patient states" my nails have grown long and thick and become painful to walk and wear shoes" Patient has been diagnosed with DM with no foot complications. The patient presents for preventative foot care services. No changes to ROS.  Patient is on plavix.  Podiatric Exam: Vascular: dorsalis pedis and posterior tibial pulses are palpable bilateral. Capillary return is immediate. Temperature gradient is WNL. Skin turgor WNL  Sensorium: Abnormal  Semmes Weinstein monofilament test. Normal tactile sensation bilaterally. Nail Exam: Pt has thick disfigured discolored nails with subungual debris noted bilateral entire nail hallux  Ulcer Exam: There is no evidence of ulcer or pre-ulcerative changes or infection. Orthopedic Exam: Muscle tone and strength are WNL. No limitations in general ROM. No crepitus or effusions noted. Foot type and digits show no abnormalities. Bony prominences are unremarkable. Skin: No Porokeratosis. No infection or ulcers  Diagnosis:  Onychomycosis, , Pain in right toe, pain in left toes  Treatment & Plan Procedures and Treatment: Consent by patient was obtained for treatment procedures. The patient understood the discussion of treatment and procedures well. All questions were answered thoroughly reviewed. Debridement of mycotic and hypertrophic toenails, 1 through 5 bilateral and clearing of subungual debris. No ulceration, no infection noted.  Return Visit-Office Procedure: Patient instructed to return to the office for a follow up visit 10 weeks for continued evaluation and treatment.    Gardiner Barefoot DPM

## 2018-05-30 ENCOUNTER — Encounter (INDEPENDENT_AMBULATORY_CARE_PROVIDER_SITE_OTHER): Payer: BLUE CROSS/BLUE SHIELD | Admitting: Ophthalmology

## 2018-05-30 DIAGNOSIS — H43813 Vitreous degeneration, bilateral: Secondary | ICD-10-CM

## 2018-05-30 DIAGNOSIS — E113592 Type 2 diabetes mellitus with proliferative diabetic retinopathy without macular edema, left eye: Secondary | ICD-10-CM | POA: Diagnosis not present

## 2018-05-30 DIAGNOSIS — E113391 Type 2 diabetes mellitus with moderate nonproliferative diabetic retinopathy without macular edema, right eye: Secondary | ICD-10-CM | POA: Diagnosis not present

## 2018-05-30 DIAGNOSIS — I10 Essential (primary) hypertension: Secondary | ICD-10-CM | POA: Diagnosis not present

## 2018-05-30 DIAGNOSIS — E11319 Type 2 diabetes mellitus with unspecified diabetic retinopathy without macular edema: Secondary | ICD-10-CM

## 2018-05-30 DIAGNOSIS — H35033 Hypertensive retinopathy, bilateral: Secondary | ICD-10-CM

## 2018-06-05 NOTE — Progress Notes (Signed)
Chief Complaint  Patient presents with  . Annual Exam    fasting annual exam. Just had exam with Dr. Zigmund Daniel last Monday.     Eduardo [name] is a 62 y.o. male who presents for a complete physical.  He has the following concerns:  He is under the care of  Dr. Chalmers Cater for Diabetes. His last visit was in January, due again later this week (no notes received from her since 05/2017).  He reports his last A1c was 7.1. Sugar was 131 this morning, usually 110-140, sees up to 180's in the morning if he ate late the night before.  Denies any recent hypoglycemia. Denies polydipsia, urinary frequency only due to diuretic. Under the care of Dr. Zigmund Daniel for diabetic retinopathy, last seen last week.  He reports macular edema in the left eye (dx'd 03/2017), treated with injections, not required the last 2 visits. He reports peripheral neuropathy with numbness in his feet, but he no longer has any pain.  He goes to [facility] for toenail care and foot exams, last seen 04/2018. He also previously was treated by Dr. Chalmers Cater for hypogonadism.  He stopped testosterone therapy 5-6 years ago, concern for risks. Level was last checked 05/2017, and was low at 267. He has had problems with fatigue, erectile problems and libido, which has improved some with weight loss (improvement reported last year, even with low T found). Still feels good, and not interested in treatment.  Dr. Chalmers Cater referred him to Dr. Jimmy Footman last year.  Last seen in March, at which time Lasix 21m daily was added, due to BLE edema.  F/u is scheduled next month.  Swelling is better. Denies muscle cramps, denies having had labs checked since lasix was added. Workup by nephrologist included normal renal [place] SPEP/UPEP, as well as labs, done 08/2017. He had 2.2g protein/24h.  Obesity: He started at [employer] January, 2018; initial weight was 336#.  He has hit a plateau, but still going to [employer] Wt Readings from Last 3 Encounters:  02/23/18 270 lb 3.2  oz (122.6 kg)  05/25/17 298 lb 3.2 oz (135.3 kg)  10/15/16 (!) 341 lb 3.2 oz (154.8 kg)   He has known PAD and is under the care of Dr. DScot Dock last in 06/2016. He has f/u scheduled for August 2019. He may cancel this and let Dr. GEinar Giphandle this (Dr. GEinar Gipoffered, since he also treats vascular disease).  Denies any claudication. He has been getting regular exercise.   3V CAD, on medical management. He is under the care of Dr. GEinar Gip last seen 01/2018.  c-met at that time notable for glu 125, BUN/Cr 31.1.16, K 5.2, normal CBC, TSH 1.500.  Tchol 115, TG 112, HDL 39, LDL 54. He is due for f/u in September. Walks 1 mile in the morning 4-5 days/week. He no longer goes to the gym, does some weights at home (bodyweight training, including planks/squats), 30 minutes every other day.  Denies chest pain, shortness of breath, claudication with exercise.  Prior eval included abnormal stress test. Echo showed LVH, and decreased LV function, EF approx 40%, and showed mild global hypokinesis, grade 1 diastolic dysfunction. He also had abnormal carotid [place] showing occlusion of L ICA in 08/2016 (per Dr. GIrven Shellingrecords, I don't see results in chart). Recommended repeat carotid in 02/2018.  He reports he had the follow-up and he was "fine", no intervention needed.  Not aware if any follow-up is needed.  Results of cardiac cath done by Dr.  Ganji: Coronary angiogram 09/08/2016: 1. Low normal LV systolic function, EF 02-77% with apical akinesis to dyskinesis. 2. Moderate coronary artery calcification, diffuse 3. Mid LAD occluded and collateralized by faint ipsilateral and contralateral collaterals from RCA. Diagonal 1 is moderate sized with ostial 30% stenosis, diagonal 2 is small to moderate sized and bifurcates, proximal segment has 80% stenosis 4. Moderate to circumflex, giving origin to moderate sized OM 1 which bifurcates. OM1 is occluded and has ipsilateral collaterals. 5. Large RCA. Dominant. Proximal 30%.  Calcified. Gives origin to 4 PL branches, PL 1 large with ostial 80% stenosis, PL 2 small, PLT 3 large and has a long segment occlusion and collaterals from both left and ipsilateral collaterals. PL 4 is moderate-sized mild disease. 6. Patent LIMA and RIMA.  HTN--he doesn't check BP's elsewhere, but checked by all his other doctors, always okay.  Denies dizziness.  Dupuytren's contractures--Can't straighten right pinkie or left ring finger.  There is some discomfort/aching, relieved by aspercreme.  Prior injection with Dr. Lenon Curt wasn't beneficial (only slight improvement, temporarily).   Immunization History  Administered Date(s) Administered  . Influenza Split 08/10/2011  . Influenza,inj,Quad PF,6+ Mos 10/15/2016  . Pneumococcal Conjugate-13 05/25/2017  . Pneumococcal Polysaccharide-23 09/28/2011  . Td 06/03/2005  . Tdap 09/28/2011   Gets yearly flu shots Last colonoscopy: 11/2014, Dr.Magod-ext and int hemorrhoids and diverticulosis.  (that was a 3 yr f/u, h/o polyps in past).  Due again 2021 Last PSA: Lab Results  Component Value Date   PSA 2.3 05/25/2017   PSA 1.09 09/28/2011  Dentist: twice yearly Ophtho: regularly (due to retinopathy) Exercise: Walks 1 mile in the morning 4-5 days/week plus bodyweight training every other day.   Doctors caring for patient:  Endo: Dr. Chalmers Cater Cardiology: Dr. Einar Gip Nephro: Dr. Jimmy Footman Derm: Dr. Renda Rolls GI: Dr. Watt Climes Ophtho: Dr. Zigmund Daniel, Dr. Peter Garter (previous Dr. Herbert Deaner, did cataract surgery) Podiatry: Dr. Prudence Davidson Vascular: Dr. Scot Dock Dentist: retired, can't recall name  Past Medical History:  Diagnosis Date  . Colon polyp   . Diabetes mellitus 1987   under care of Dr. Chalmers Cater.  On insulin since 96 (off and on)  . Diabetic retinopathy   . Dupuytren contracture    R hand, s/p injection (Dr. Lenon Curt)  . Essential hypertension, benign   . Frequency of urination and polyuria   . Hypertension   . Myocardial infarction ([place])   .  Neuromuscular disorder ([place])    Diabetic neuropathy  . Other testicular hypofunction   . Peripheral vascular disease ([place])   . Peritoneal abscess ([place]) 6/08   and buttock.  . Polydipsia   . Proteinuria   . Pure hyperglyceridemia     Past Surgical History:  Procedure Laterality Date  . ABDOMINAL AORTAGRAM N/A 04/18/2012   Procedure: ABDOMINAL Maxcine Ham;  Surgeon: Angelia Mould, MD;  Location: [facility] CATH LAB;  Service: Cardiovascular;  Laterality: N/A;  . CARDIAC CATHETERIZATION N/A 09/08/2016   Procedure: Left Heart Cath and Coronary Angiography;  Surgeon: Adrian Prows, MD;  Location: [place] CV LAB;  Service: Cardiovascular;  Laterality: N/A;  . CATARACT EXTRACTION, BILATERAL  09/2017, 10/2017   Dr. Herbert Deaner  . LOWER EXTREMITY ANGIOGRAM Bilateral 04/18/2012   Procedure: LOWER EXTREMITY ANGIOGRAM;  Surgeon: Angelia Mould, MD;  Location: [facility] CATH LAB;  Service: Cardiovascular;  Laterality: Bilateral;  bilat lower extrem angio  . macular photocoagulation     (eye treatments for diabetic retinopathy)-Dr. Zigmund Daniel    Social History   Socioeconomic History  . Marital status: Married  Spouse name: Not on file  . Number of children: 0  . Years of education: Not on file  . Highest education level: Not on file  Occupational History  . 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 Financial resource strain: Not on file  . Food insecurity:    Worry: Not on file    Inability: Not on file  . Transportation needs:    Medical: Not on file    Non-medical: Not on file  Tobacco Use  . Smoking status: Former Smoker    Packs/day: 1.00    Years: 30.00    Pack years: 30.00    Types: Cigarettes    Last attempt to quit: 01/08/2012    Years since quitting: 6.4  . Smokeless tobacco: Never Used  Substance and Sexual Activity  . Alcohol use: No  . Drug use: No  . Sexual activity: Not Currently  Lifestyle  . Physical  activity:    Days per week: Not on file    Minutes per session: Not on file  . Stress: Not on file  Relationships  . Social connections:    Talks on phone: Not on file    Gets together: Not on file    Attends religious service: Not on file    Active member of club or organization: Not on file    Attends meetings of clubs or organizations: Not on file    Relationship status: Not on file  . Intimate partner violence:    Fear of current or ex partner: Not on file    Emotionally abused: Not on file    Physically abused: Not on file    Forced sexual activity: Not on file  Other Topics Concern  . Not on file  Social History Narrative   Widowed. Previously traveled 40 weeks out of the year. No longer travels as much. No pets    Family History  Problem Relation Age of Onset  . Diabetes Mother   . Hearing loss Mother   . Hypertension Mother   . Hyperlipidemia Mother   . Heart disease Mother   . Varicose Veins Mother   . Varicose Veins Father   . Dementia Father   . Hyperlipidemia Brother   . Diabetes Maternal Grandmother     Outpatient Encounter Medications as of 06/06/2018  Medication Sig Note  . Bioflavonoid Products (ESTER-C) 1000-50 MG TABS Take 1 tablet by mouth daily.    . Cholecalciferol (VITAMIN D) 2000 units CAPS Take 2,000 Units by mouth daily.   . clopidogrel (PLAVIX) 75 MG tablet Take 75 mg by mouth daily.   . furosemide (LASIX) 20 MG tablet TK 1 T PO D   . gemfibrozil (LOPID) 600 MG tablet Take 600 mg by mouth 2 (two) times daily before a meal.     . HUMALOG KWIKPEN 100 UNIT/ML KiwkPen Inject 0-40 Units into the skin 3 (three) times daily with meals. SLIDING SCALE DEPENDING ON BLOOD GLUCOSE BEFORE MEAL 05/25/2017: Usually 0-8 units with meals  . HUMULIN N KWIKPEN 100 UNIT/ML Kiwkpen Inject 60 Units into the skin 3 (three) times daily. 02/23/2018: 30/20/30  . isosorbide mononitrate (IMDUR) 60 MG 24 hr tablet Take 60 mg by mouth daily.   Marland Kitchen lisinopril (PRINIVIL,ZESTRIL)  10 MG tablet TK 1 T PO D   . metoprolol succinate (TOPROL-XL) 100 MG 24 hr tablet Take 100 mg by mouth daily. Take with or immediately following a meal.   .  Multiple Vitamin (MULTIVITAMIN) tablet Take 1 tablet by mouth daily.   . Probiotic Product (PROBIOTIC-10 PO) Take 1 capsule by mouth daily.   . rosuvastatin (CRESTOR) 20 MG tablet Take 20 mg by mouth daily.   Marland Kitchen BESIVANCE 0.6 % SUSP  05/25/2017: Day of and day after eye injections.   Marland Kitchen DM-APAP-CPM (CORICIDIN HBP PO) Take 2 tablets by mouth daily.   . nitroGLYCERIN (NITROSTAT) 0.4 MG SL tablet Place 0.4 mg under the tongue every 5 (five) minutes as needed for chest pain. 09/04/2016: NEVER USED  . [DISCONTINUED] lisinopril (PRINIVIL,ZESTRIL) 5 MG tablet take 2 daily    No facility-administered encounter medications on file as of 06/06/2018.     No Known Allergies Didn't tolerate one of the eye drops related to the cataract surgery, can't recall the name.   ROS:  The patient denies anorexia, fever, headaches, decreased hearing, ear pain, hoarseness, chest pain, palpitations, dizziness, syncope, dyspnea on exertion, cough, swelling, nausea, vomiting, diarrhea, constipation, abdominal pain, melena, hematochezia, indigestion/heartburn, hematuria, incontinence, nocturia, weakened urine stream, dysuria, genital lesions, joint pains, weakness, tremor, suspicious skin lesions, depression, anxiety, abnormal bleeding/bruising, or enlarged lymph nodes ED and decreased libido continues to improve with weight loss. Numbness in feet, no pain or sores. Some bruising related to plavix and aspirin, mild, related to trauma. +intentional weight loss with [employer] (lost 23# since his physical last year, though is up 5# from visit in April). Boils have been much less often, in peritoneal area, last one was a year ago, until the recent one he got, that just "popped" this morning. Feels better now. Has some loose stools after certain foods (spicy [place]).   PHYSICAL  EXAM:  BP 130/70   Pulse 68   Ht 5' 9"  (1.753 m)   Wt 275 lb 3.2 oz (124.8 kg)   BMI 40.64 kg/m    Wt Readings from Last 3 Encounters:  06/06/18 275 lb 3.2 oz (124.8 kg)  02/23/18 270 lb 3.2 oz (122.6 kg)  05/25/17 298 lb 3.2 oz (135.3 kg)    General Appearance:    Alert, cooperative, no distress, appears stated age. Head is bald/shaven (with a bandage on the top, cut himself shaving)  Head:    Normocephalic, without obvious abnormality, atraumatic  Eyes:    PERRL, conjunctiva/corneas clear, EOM's intact, fundi not visualized  Ears:    Normal TM's and external ear canals  Nose:   Nares normal, mucosa normal, no drainage or sinus   tenderness  Throat:   Lips, mucosa, and tongue normal; teeth and gums normal  Neck:   Supple, no lymphadenopathy;  thyroid:  no enlargement/ tenderness/nodules; no carotid bruit or JVD  Back:    Spine nontender, no curvature, ROM normal, no CVA   tenderness  Lungs:     Clear to auscultation bilaterally without wheezes, rales or   ronchi; respirations unlabored  Chest Wall:    No tenderness or deformity   Heart:    Regular rate and rhythm, S1 and S2 normal, no murmur, rub   or gallop  Breast Exam:    No chest wall tenderness, or masses   Abdomen:     Soft, non-tender, obese; nondistended, normoactive bowel sounds, no masses, no hepatosplenomegaly  Genitalia:    Normal male external genitalia without lesions.  Testicles without masses.  No inguinal hernias.  Rectal:    Normal sphincter tone, no masses or tenderness; guaiac negative stool.  Prostate smooth, no nodules, not enlarged.  Extremities:   No clubbing, cyanosis  or edema  Pulses:   Diminished but palpable distal pulses in LE's  Skin:   Turgor normal, no suspicious lesions. Posterior upper thighs with some hyperpigmentation, slightly pink. Right posterior and inner thigh--has bandages, some erythema and minimal induration. No swelling.  Nontender (states pain resolved once the abscess started draining  this morning).  Lymph nodes:   Cervical, supraclavicular, and inguinal lymph nodes normal  Neurologic:    CNII-XII intact, normal strength, sensation and gait; reflexes 2+ and symmetric throughout. Decreased monofilament sensation in both feet (normal in  Right foot in the middle toes and around 2nd and 3rd metatarsal heads, diminished elsewhere)                                Psych:   Normal mood, affect, hygiene and grooming.    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 Chalmers Cater. Will need records  CKD (chronic kidney disease) stage 3, GFR 30-59 ml/min (H[facility])  Type 2 diabetes mellitus with nephropathy ([place])  Peripheral vascular disease, unspecified ([place]) - asymptomatic. Continue current meds, regular exercise and f/u with Dr. Einar Gip  Hypertension associated with diabetes [facility])  3-vessel coronary artery disease - on medical management, without angina or side effects from medications  Pure hyperglyceridemia  Class 3 drug-induced obesity with serious comorbidity and body mass index (BMI) of 40.0 to 44.9 in adult [facility]) - congratulated on weight loss thus far, counseled re: diet/exercise and further weight loss  Screening for prostate cancer - Plan: PSA  Medication monitoring encounter - Plan: Basic metabolic panel  Need for shingles vaccine - risks/side effects reviewed. Return in 2 mos for 2nd - Plan: Varicella-zoster vaccine IM (Shingrix)   Lipids 01/2018 at goal Chem last done 01/2018 by cards and nephro. b-met recommended, given that it hasn't been checked since starting lasix (probably fine, last K was slightly high)  PSA due HIV (had new partner, used condom; hasn't been tested in a long tie, and would like testing), b-met Safe sex  encouraged  Forward labs to Dr. Jimmy Footman, Chalmers Cater   Discussed PSA screening (risks/benefits), recommended at least 30 minutes of aerobic activity at least 5 days/week; proper sunscreen use reviewed; healthy diet and alcohol recommendations (less than or equal to 2 drinks/day) reviewed; regular seatbelt use; changing batteries in smoke detectors. Immunization recommendations discussed--continue yearly flu shots.  Pneumovax age 63.  Shingrix recommended, risks/side effects reviewed, willing to pay if not covered. Colonoscopy recommendations reviewed, due 2021  F/u 2 mos for second Shingrix; f/u 1 year for CPE, sooner prn.   Depending on your next lipid results, I think you should discuss wiyth Dr. Einar Gip whether or not there is still the need for the twice daily dosing of gemfibrozil.  Your triglycerides have been much better since getting diabetes and weight under better control.  Let us know the eye drop that you didn't tolerate (the name, and the reaction) and we can document that in your [facility] chart.

## 2018-06-06 ENCOUNTER — Encounter: Payer: Self-pay | Admitting: Family Medicine

## 2018-06-06 ENCOUNTER — Ambulatory Visit (INDEPENDENT_AMBULATORY_CARE_PROVIDER_SITE_OTHER): Payer: BLUE CROSS/BLUE SHIELD | Admitting: Family Medicine

## 2018-06-06 VITALS — BP 130/70 | HR 68 | Ht 69.0 in | Wt 275.2 lb

## 2018-06-06 DIAGNOSIS — E1159 Type 2 diabetes mellitus with other circulatory complications: Secondary | ICD-10-CM

## 2018-06-06 DIAGNOSIS — Z5181 Encounter for therapeutic drug level monitoring: Secondary | ICD-10-CM

## 2018-06-06 DIAGNOSIS — Z125 Encounter for screening for malignant neoplasm of prostate: Secondary | ICD-10-CM | POA: Diagnosis not present

## 2018-06-06 DIAGNOSIS — E11311 Type 2 diabetes mellitus with unspecified diabetic retinopathy with macular edema: Secondary | ICD-10-CM | POA: Diagnosis not present

## 2018-06-06 DIAGNOSIS — Z6841 Body Mass Index (BMI) 40.0 and over, adult: Secondary | ICD-10-CM

## 2018-06-06 DIAGNOSIS — N183 Chronic kidney disease, stage 3 unspecified: Secondary | ICD-10-CM

## 2018-06-06 DIAGNOSIS — E661 Drug-induced obesity: Secondary | ICD-10-CM

## 2018-06-06 DIAGNOSIS — Z794 Long term (current) use of insulin: Secondary | ICD-10-CM

## 2018-06-06 DIAGNOSIS — Z0000 Encounter for general adult medical examination without abnormal findings: Secondary | ICD-10-CM

## 2018-06-06 DIAGNOSIS — E1121 Type 2 diabetes mellitus with diabetic nephropathy: Secondary | ICD-10-CM

## 2018-06-06 DIAGNOSIS — I10 Essential (primary) hypertension: Secondary | ICD-10-CM

## 2018-06-06 DIAGNOSIS — I739 Peripheral vascular disease, unspecified: Secondary | ICD-10-CM

## 2018-06-06 DIAGNOSIS — I152 Hypertension secondary to endocrine disorders: Secondary | ICD-10-CM

## 2018-06-06 DIAGNOSIS — Z23 Encounter for immunization: Secondary | ICD-10-CM | POA: Diagnosis not present

## 2018-06-06 DIAGNOSIS — I2510 Atherosclerotic heart disease of native coronary artery without angina pectoris: Secondary | ICD-10-CM

## 2018-06-06 DIAGNOSIS — E781 Pure hyperglyceridemia: Secondary | ICD-10-CM

## 2018-06-06 NOTE — Patient Instructions (Signed)
  HEALTH MAINTENANCE RECOMMENDATIONS:  It is recommended that you get at least 30 minutes of aerobic exercise at least 5 days/week (for weight loss, you may need as much as 60-90 minutes). This can be any activity that gets your heart rate up. This can be divided in 10-15 minute intervals if needed, but try and build up your endurance at least once a week.  Weight bearing exercise is also recommended twice weekly.  Eat a healthy diet with lots of vegetables, fruits and fiber.  "Colorful" foods have a lot of vitamins (ie green vegetables, tomatoes, red peppers, etc).  Limit sweet tea, regular sodas and alcoholic beverages, all of which has a lot of calories and sugar.  Up to 2 alcoholic drinks daily may be beneficial for men (unless trying to lose weight, watch sugars).  Drink a lot of water.  Sunscreen of at least SPF 30 should be used on all sun-exposed parts of the skin when outside between the hours of 10 am and 4 pm (not just when at beach or pool, but even with exercise, golf, tennis, and yard work!)  Use a sunscreen that says "broad spectrum" so it covers both UVA and UVB rays, and make sure to reapply every 1-2 hours.  Remember to change the batteries in your smoke detectors when changing your clock times in the spring and fall.  Use your seat belt every time you are in a car, and please drive safely and not be distracted with cell phones and texting while driving.  Keep up the good work, and push past the plateau in weight loss. Return if the area on your right thigh gets painful, swollen, redness doesn't resolve, any fever develops, etc.

## 2018-06-07 LAB — BASIC METABOLIC PANEL
BUN/Creatinine Ratio: 24 (ref 10–24)
BUN: 30 mg/dL — ABNORMAL HIGH (ref 8–27)
CALCIUM: 9.5 mg/dL (ref 8.6–10.2)
CO2: 26 mmol/L (ref 20–29)
CREATININE: 1.27 mg/dL (ref 0.76–1.27)
Chloride: 102 mmol/L (ref 96–106)
GFR calc Af Amer: 70 mL/min/{1.73_m2} (ref >59)
GFR, EST NON AFRICAN AMERICAN: 60 mL/min/{1.73_m2} (ref >59)
Glucose: 171 mg/dL — ABNORMAL HIGH (ref 65–99)
Potassium: 4.9 mmol/L (ref 3.5–5.2)
SODIUM: 142 mmol/L (ref 134–144)

## 2018-06-07 LAB — PSA: Prostate Specific Ag, Serum: 2.8 ng/mL (ref 0.0–4.0)

## 2018-06-07 LAB — HIV ANTIBODY (ROUTINE TESTING W REFLEX): HIV Screen 4th Generation wRfx: NONREACTIVE

## 2018-06-09 DIAGNOSIS — I10 Essential (primary) hypertension: Secondary | ICD-10-CM | POA: Diagnosis not present

## 2018-06-09 DIAGNOSIS — E139 Other specified diabetes mellitus without complications: Secondary | ICD-10-CM | POA: Diagnosis not present

## 2018-06-09 DIAGNOSIS — E1165 Type 2 diabetes mellitus with hyperglycemia: Secondary | ICD-10-CM | POA: Diagnosis not present

## 2018-06-09 DIAGNOSIS — E7800 Pure hypercholesterolemia, unspecified: Secondary | ICD-10-CM | POA: Diagnosis not present

## 2018-06-09 LAB — HEMOGLOBIN A1C: Hemoglobin A1C: 7.2

## 2018-06-09 NOTE — Progress Notes (Signed)
Records came from Dr. Rochele Pages need to call for them. Needs NV scheduled (Shingrix).

## 2018-06-13 DIAGNOSIS — R809 Proteinuria, unspecified: Secondary | ICD-10-CM | POA: Diagnosis not present

## 2018-06-13 DIAGNOSIS — D631 Anemia in chronic kidney disease: Secondary | ICD-10-CM | POA: Diagnosis not present

## 2018-06-13 DIAGNOSIS — N183 Chronic kidney disease, stage 3 (moderate): Secondary | ICD-10-CM | POA: Diagnosis not present

## 2018-06-13 DIAGNOSIS — N2581 Secondary hyperparathyroidism of renal origin: Secondary | ICD-10-CM | POA: Diagnosis not present

## 2018-06-13 DIAGNOSIS — I129 Hypertensive chronic kidney disease with stage 1 through stage 4 chronic kidney disease, or unspecified chronic kidney disease: Secondary | ICD-10-CM | POA: Diagnosis not present

## 2018-06-15 ENCOUNTER — Ambulatory Visit: Payer: BLUE CROSS/BLUE SHIELD | Admitting: Vascular Surgery

## 2018-06-15 ENCOUNTER — Encounter (HOSPITAL_COMMUNITY): Payer: BLUE CROSS/BLUE SHIELD

## 2018-06-29 ENCOUNTER — Encounter (HOSPITAL_COMMUNITY): Payer: BLUE CROSS/BLUE SHIELD

## 2018-06-29 ENCOUNTER — Ambulatory Visit: Payer: BLUE CROSS/BLUE SHIELD | Admitting: Vascular Surgery

## 2018-07-13 ENCOUNTER — Ambulatory Visit (INDEPENDENT_AMBULATORY_CARE_PROVIDER_SITE_OTHER): Payer: BLUE CROSS/BLUE SHIELD | Admitting: Podiatry

## 2018-07-13 ENCOUNTER — Encounter: Payer: Self-pay | Admitting: Podiatry

## 2018-07-13 DIAGNOSIS — E1140 Type 2 diabetes mellitus with diabetic neuropathy, unspecified: Secondary | ICD-10-CM

## 2018-07-13 DIAGNOSIS — M79676 Pain in unspecified toe(s): Secondary | ICD-10-CM | POA: Diagnosis not present

## 2018-07-13 DIAGNOSIS — D689 Coagulation defect, unspecified: Secondary | ICD-10-CM | POA: Diagnosis not present

## 2018-07-13 DIAGNOSIS — B351 Tinea unguium: Secondary | ICD-10-CM

## 2018-07-13 NOTE — Progress Notes (Signed)
Patient ID: Eduardo [name], male   DOB: 1956/10/25, 62 y.o.   MRN: [id number] Complaint:  Visit Type: Patient returns to my office for continued preventative foot care services. Complaint: Patient states" my nails have grown long and thick and become painful to walk and wear shoes" Patient has been diagnosed with DM with no foot complications. The patient presents for preventative foot care services. No changes to ROS.  Patient is on plavix.  Podiatric Exam: Vascular: dorsalis pedis and posterior tibial pulses are palpable bilateral. Capillary return is immediate. Temperature gradient is WNL. Skin turgor WNL  Sensorium: Abnormal  Semmes Weinstein monofilament test. Normal tactile sensation bilaterally. Nail Exam: Pt has thick disfigured discolored nails with subungual debris noted bilateral entire nail hallux  Ulcer Exam: There is no evidence of ulcer or pre-ulcerative changes or infection. Orthopedic Exam: Muscle tone and strength are WNL. No limitations in general ROM. No crepitus or effusions noted. Foot type and digits show no abnormalities. Bony prominences are unremarkable. Skin: No Porokeratosis. No infection or ulcers  Diagnosis:  Onychomycosis, , Pain in right toe, pain in left toes  Treatment & Plan Procedures and Treatment: Consent by patient was obtained for treatment procedures. The patient understood the discussion of treatment and procedures well. All questions were answered thoroughly reviewed. Debridement of mycotic and hypertrophic toenails, 1 through 5 bilateral and clearing of subungual debris. No ulceration, no infection noted.  Return Visit-Office Procedure: Patient instructed to return to the office for a follow up visit 12 weeks for continued evaluation and treatment.    Gardiner Barefoot DPM

## 2018-08-01 DIAGNOSIS — N183 Chronic kidney disease, stage 3 (moderate): Secondary | ICD-10-CM | POA: Diagnosis not present

## 2018-08-01 DIAGNOSIS — I2510 Atherosclerotic heart disease of native coronary artery without angina pectoris: Secondary | ICD-10-CM | POA: Diagnosis not present

## 2018-08-01 DIAGNOSIS — E1122 Type 2 diabetes mellitus with diabetic chronic kidney disease: Secondary | ICD-10-CM | POA: Diagnosis not present

## 2018-08-01 DIAGNOSIS — E7800 Pure hypercholesterolemia, unspecified: Secondary | ICD-10-CM | POA: Diagnosis not present

## 2018-08-09 ENCOUNTER — Other Ambulatory Visit (INDEPENDENT_AMBULATORY_CARE_PROVIDER_SITE_OTHER): Payer: BLUE CROSS/BLUE SHIELD

## 2018-08-09 ENCOUNTER — Ambulatory Visit: Payer: BLUE CROSS/BLUE SHIELD | Admitting: Family Medicine

## 2018-08-09 DIAGNOSIS — Z23 Encounter for immunization: Secondary | ICD-10-CM | POA: Diagnosis not present

## 2018-08-29 ENCOUNTER — Encounter (INDEPENDENT_AMBULATORY_CARE_PROVIDER_SITE_OTHER): Payer: BLUE CROSS/BLUE SHIELD | Admitting: Ophthalmology

## 2018-08-29 DIAGNOSIS — I10 Essential (primary) hypertension: Secondary | ICD-10-CM

## 2018-08-29 DIAGNOSIS — E113391 Type 2 diabetes mellitus with moderate nonproliferative diabetic retinopathy without macular edema, right eye: Secondary | ICD-10-CM

## 2018-08-29 DIAGNOSIS — E11319 Type 2 diabetes mellitus with unspecified diabetic retinopathy without macular edema: Secondary | ICD-10-CM | POA: Diagnosis not present

## 2018-08-29 DIAGNOSIS — H35033 Hypertensive retinopathy, bilateral: Secondary | ICD-10-CM

## 2018-08-29 DIAGNOSIS — E113592 Type 2 diabetes mellitus with proliferative diabetic retinopathy without macular edema, left eye: Secondary | ICD-10-CM

## 2018-08-29 DIAGNOSIS — H43813 Vitreous degeneration, bilateral: Secondary | ICD-10-CM

## 2018-10-12 ENCOUNTER — Ambulatory Visit: Payer: BLUE CROSS/BLUE SHIELD | Admitting: Podiatry

## 2018-10-20 DIAGNOSIS — I129 Hypertensive chronic kidney disease with stage 1 through stage 4 chronic kidney disease, or unspecified chronic kidney disease: Secondary | ICD-10-CM | POA: Diagnosis not present

## 2018-10-20 DIAGNOSIS — D631 Anemia in chronic kidney disease: Secondary | ICD-10-CM | POA: Diagnosis not present

## 2018-10-20 DIAGNOSIS — N189 Chronic kidney disease, unspecified: Secondary | ICD-10-CM | POA: Diagnosis not present

## 2018-10-20 DIAGNOSIS — N183 Chronic kidney disease, stage 3 (moderate): Secondary | ICD-10-CM | POA: Diagnosis not present

## 2018-10-20 DIAGNOSIS — N2581 Secondary hyperparathyroidism of renal origin: Secondary | ICD-10-CM | POA: Diagnosis not present

## 2018-12-16 DIAGNOSIS — E139 Other specified diabetes mellitus without complications: Secondary | ICD-10-CM | POA: Diagnosis not present

## 2018-12-16 DIAGNOSIS — E1165 Type 2 diabetes mellitus with hyperglycemia: Secondary | ICD-10-CM | POA: Diagnosis not present

## 2018-12-16 DIAGNOSIS — E7800 Pure hypercholesterolemia, unspecified: Secondary | ICD-10-CM | POA: Diagnosis not present

## 2018-12-16 DIAGNOSIS — I10 Essential (primary) hypertension: Secondary | ICD-10-CM | POA: Diagnosis not present

## 2019-01-02 ENCOUNTER — Encounter (INDEPENDENT_AMBULATORY_CARE_PROVIDER_SITE_OTHER): Payer: BLUE CROSS/BLUE SHIELD | Admitting: Ophthalmology

## 2019-01-02 DIAGNOSIS — E11319 Type 2 diabetes mellitus with unspecified diabetic retinopathy without macular edema: Secondary | ICD-10-CM

## 2019-01-02 DIAGNOSIS — E113592 Type 2 diabetes mellitus with proliferative diabetic retinopathy without macular edema, left eye: Secondary | ICD-10-CM | POA: Diagnosis not present

## 2019-01-02 DIAGNOSIS — E113311 Type 2 diabetes mellitus with moderate nonproliferative diabetic retinopathy with macular edema, right eye: Secondary | ICD-10-CM

## 2019-01-02 DIAGNOSIS — H35033 Hypertensive retinopathy, bilateral: Secondary | ICD-10-CM

## 2019-01-02 DIAGNOSIS — H43813 Vitreous degeneration, bilateral: Secondary | ICD-10-CM

## 2019-01-02 DIAGNOSIS — I10 Essential (primary) hypertension: Secondary | ICD-10-CM

## 2019-01-30 ENCOUNTER — Ambulatory Visit: Payer: BLUE CROSS/BLUE SHIELD | Admitting: Cardiology

## 2019-02-06 ENCOUNTER — Encounter: Payer: Self-pay | Admitting: Cardiology

## 2019-02-06 ENCOUNTER — Ambulatory Visit: Payer: BLUE CROSS/BLUE SHIELD | Admitting: Cardiology

## 2019-02-06 ENCOUNTER — Other Ambulatory Visit: Payer: Self-pay

## 2019-02-06 VITALS — Ht 70.0 in | Wt 271.0 lb

## 2019-02-06 DIAGNOSIS — I10 Essential (primary) hypertension: Secondary | ICD-10-CM | POA: Diagnosis not present

## 2019-02-06 DIAGNOSIS — I2510 Atherosclerotic heart disease of native coronary artery without angina pectoris: Secondary | ICD-10-CM | POA: Diagnosis not present

## 2019-02-06 DIAGNOSIS — E7800 Pure hypercholesterolemia, unspecified: Secondary | ICD-10-CM | POA: Diagnosis not present

## 2019-02-06 HISTORY — DX: Essential (primary) hypertension: I10

## 2019-02-06 MED ORDER — ASPIRIN EC 81 MG PO TBEC: 81.0000 mg | DELAYED_RELEASE_TABLET | Freq: Every day | ORAL | 3 refills | Status: DC

## 2019-02-06 NOTE — Progress Notes (Signed)
Subjective:  Primary Physician:  Rita Ohara, MD  Patient ID: Eduardo [name], male    DOB: [birth year], 63 y.o.   MRN: [id number]  Chief Complaint  Patient presents with  . Coronary Artery Disease  . Follow-up    6 mth    HPI: Eduardo [name]  is a 63 y.o. male  with  coronary artery disease by angiography on 09/08/2016 revealing severe triple-vessel CAD with no targets for CABG, presently on aggressive medical therapy presents here for follow-up. He has controlled type II diabetes, hyperlipidemia, and hypertension. His vascular history also includes mild carotid atherosclerosis, right SFA occlusion and small vessel disease involving both lower extremities below the knee by angiography in 2013.  Since being on aggressive lifestyle modification, he has not had any recurrence of angina pectoris.  States that symptoms of claudication of also improved and overall states that in general he feels essentially asymptomatic except for very minimal dyspnea on extremes of exertion.  States that his diabetes is also improving.  Past Medical History:  Diagnosis Date  . Colon polyp   . Diabetes mellitus 1987   under care of Dr. Chalmers Cater.  On insulin since 96 (off and on)  . Diabetic retinopathy   . Dupuytren contracture    R hand, s/p injection (Dr. Lenon Curt)  . Essential hypertension, benign   . Essential hypertension, benign 02/06/2019  . Frequency of urination and polyuria   . Hypertension   . Myocardial infarction ([place])   . Neuromuscular disorder ([place])    Diabetic neuropathy  . Other testicular hypofunction   . Peripheral arterial disease ([place]) 10/28/2012  . Peripheral vascular disease ([place])   . Peritoneal abscess ([place]) 6/08   and buttock.  . Polydipsia   . Proteinuria   . Pure hyperglyceridemia     Past Surgical History:  Procedure Laterality Date  . ABDOMINAL AORTAGRAM N/A 04/18/2012   Procedure: ABDOMINAL Maxcine Ham;  Surgeon: Angelia Mould, MD;  Location: [facility] CATH LAB;  Service:  Cardiovascular;  Laterality: N/A;  . CARDIAC CATHETERIZATION N/A 09/08/2016   Procedure: Left Heart Cath and Coronary Angiography;  Surgeon: Adrian Prows, MD;  Location: [facility] CV LAB;  Service: Cardiovascular;  Laterality: N/A;  . CATARACT EXTRACTION, BILATERAL  09/2017, 10/2017   Dr. Herbert Deaner  . LOWER EXTREMITY ANGIOGRAM Bilateral 04/18/2012   Procedure: LOWER EXTREMITY ANGIOGRAM;  Surgeon: Angelia Mould, MD;  Location: [facility] C[facility] LAB;  Service: Cardiovascular;  Laterality: Bilateral;  bilat lower extrem angio  . macular photocoagulation     (eye treatments for diabetic retinopathy)-Dr. Zigmund Daniel    Social History   Socioeconomic History  . Marital status: Widowed    Spouse name: Not on file  . Number of children: 0  . Years of education: Not on file  . Highest education level: Not on file  Occupational History  . 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 Financial resource strain: Not on file  . Food insecurity:    Worry: Not on file    Inability: Not on file  . Transportation needs:    Medical: Not on file    Non-medical: Not on file  Tobacco Use  . Smoking status: Former Smoker    Packs/day: 1.00    Years: 30.00    Pack years: 30.00    Types: Cigarettes    Last attempt to quit: 01/08/2012    Years since quitting: 7.0  . Smokeless  tobacco: Never Used  Substance and Sexual Activity  . Alcohol use: No  . Drug use: No  . Sexual activity: Not Currently  Lifestyle  . Physical activity:    Days per week: Not on file    Minutes per session: Not on file  . Stress: Not on file  Relationships  . Social connections:    Talks on phone: Not on file    Gets together: Not on file    Attends religious service: Not on file    Active member of club or organization: Not on file    Attends meetings of clubs or organizations: Not on file    Relationship status: Not on file  . Intimate partner violence:    Fear  of current or ex partner: Not on file    Emotionally abused: Not on file    Physically abused: Not on file    Forced sexual activity: Not on file  Other Topics Concern  . Not on file  Social History Narrative   Widowed. Previously traveled 40 weeks out of the year. No longer travels as much. No pets    Current Outpatient Medications on File Prior to Visit  Medication Sig Dispense Refill  . B-D UF III MINI PEN NEEDLES 31G X 5 MM MISC U UTD 6 TIMES A DAY  0  . Bioflavonoid Products (ESTER-C) 1000-50 MG TABS Take 1 tablet by mouth daily.     . Cholecalciferol (VITAMIN D) 2000 units CAPS Take 2,000 Units by mouth daily.    . clopidogrel (PLAVIX) 75 MG tablet Take 75 mg by mouth daily.    Marland Kitchen DM-APAP-CPM (CORICIDIN HBP PO) Take 2 tablets by mouth as needed.     . furosemide (LASIX) 20 MG tablet TK 1 T PO D  5  . gemfibrozil (LOPID) 600 MG tablet Take 600 mg by mouth 2 (two) times daily before a meal.      . HUMALOG KWIKPEN 100 UNIT/ML KiwkPen Inject 0-12 Units into the skin 2 (two) times daily. SLIDING SCALE DEPENDING ON BLOOD GLUCOSE BEFORE MEAL  1  . HUMULIN N KWIKPEN 100 UNIT/ML Kiwkpen Inject 30-40 Units into the skin 2 (two) times daily. A.m 40 units, P.m. 30 units  1  . isosorbide mononitrate (IMDUR) 60 MG 24 hr tablet Take 60 mg by mouth daily.    Marland Kitchen lisinopril (PRINIVIL,ZESTRIL) 10 MG tablet TK 1 T PO D  3  . metoprolol succinate (TOPROL-XL) 100 MG 24 hr tablet Take 100 mg by mouth daily. Take with or immediately following a meal.    . Multiple Vitamin (MULTIVITAMIN) tablet Take 1 tablet by mouth daily.    . nitroGLYCERIN (NITROSTAT) 0.4 MG SL tablet Place 0.4 mg under the tongue every 5 (five) minutes as needed for chest pain.  1  . Probiotic Product (PROBIOTIC-10 PO) Take 1 capsule by mouth daily.    . rosuvastatin (CRESTOR) 20 MG tablet Take 20 mg by mouth daily.  3  . BESIVANCE 0.6 % SUSP   12   No current facility-administered medications on file prior to visit.     Review of  Systems  Constitution: Negative for chills, decreased appetite, malaise/fatigue and weight gain.  Cardiovascular: Positive for dyspnea on exertion (stable). Negative for claudication, leg swelling and syncope.  Endocrine: Negative for cold intolerance.  Hematologic/Lymphatic: Does not bruise/bleed easily.  Musculoskeletal: Negative for joint swelling.  Gastrointestinal: Negative for abdominal pain, anorexia and change in bowel habit.  Neurological: Negative for headaches and light-headedness.  Psychiatric/Behavioral: Negative for depression and substance abuse.  All other systems reviewed and are negative.      Objective:  Height 5' 10"  (1.778 m), weight 271 lb (122.9 kg). Body mass index is 38.88 kg/m.  Physical Exam  Constitutional: He appears well-developed. No distress.  Morbidly obese  HENT:  Head: Atraumatic.  Eyes: Conjunctivae are normal.  Neck: Neck supple. No thyromegaly present.  Short neck and difficult to evaluate JVP  Cardiovascular: Exam reveals no gallop.  No murmur heard. Pulses:      Carotid pulses are 2+ on the right side and 2+ on the left side.      Dorsalis pedis pulses are 2+ on the right side and 2+ on the left side.       Posterior tibial pulses are 2+ on the right side and 2+ on the left side.  Femoral and popliteal pulse difficult to feel due to patient's body habitus.   Pulmonary/Chest: Effort normal.  Abdominal: Soft.  Obese.   Musculoskeletal: Normal range of motion.        General: No edema.  Neurological: He is alert.  Skin: Skin is dry.  Psychiatric: He has a normal mood and affect.   Radiology: No results found.  Laboratory Examination:  CMP Latest Ref Rng & Units 06/06/2018 09/08/2016 04/18/2012  Glucose 65 - 99 mg/dL 171(H) 306(H) 90  BUN 8 - 27 mg/dL 30(H) 34(H) 35(H)  Creatinine 0.76 - 1.27 mg/dL 1.27 1.34(H) 1.60(H)  Sodium 134 - 144 mmol/L 142 138 143  Potassium 3.5 - 5.2 mmol/L 4.9 4.8 4.8  Chloride 96 - 106 mmol/L 102 110 111   CO2 20 - 29 mmol/L 26 22 -  Calcium 8.6 - 10.2 mg/dL 9.5 9.3 -  Total Protein 6.0 - 8.3 g/dL - - -  Total Bilirubin 0.3 - 1.2 mg/dL - - -  Alkaline Phos 39 - 117 U/L - - -  AST 0 - 37 U/L - - -  ALT 0 - 53 U/L - - -   CBC Latest Ref Rng & Units 04/18/2012 09/28/2011  WBC 4.0 - 10.5 K/uL - 13.0(H)  Hemoglobin 13.0 - 17.0 g/dL 13.6 15.4  Hematocrit 39.0 - 52.0 % 40.0 46.0  Platelets 150 - 400 K/uL - 275   Lipid Panel     Component Value Date/Time   CHOL 92 05/24/2017   TRIG 72 05/24/2017   HDL 36 05/24/2017   CHOLHDL 4.4 09/28/2011 1358   VLDL 21 09/28/2011 1358   LDLCALC 42 05/24/2017   HEMOGLOBIN A1C Lab Results  Component Value Date   HGBA1C 7.2 06/09/2018   MPG 192 (H) 09/28/2011   TSH No results for input(s): TSH in the last 8760 hours.  CARDIAC STUDIES:   Echocardiogram 09/01/2016: Left ventricle cavity is normal in size. Moderate concentric hypertrophy of the left ventricle. Mild to moderately reduced left ventricular function. Left ventricle regional wall motion findings: Poor echo window and reduced sensitivity.  There is inferior hypokinesis, lateral hypokinesis, mild global hypokinesis. Grade 1 diastolic dysfunction.  Calculated EF 40%. Left atrial cavity is mildly dilated at 4.2 cm. IVC is dilated with poor inspiration collapse consistent with elevated right atrial pressure.  Exercise sestamibi stress test 08/17/2016: 1. Resting EKG demonstrates normal sinus rhythm, left axis deviation, left plantar fascicular block.  Poor R-wave progression, ulnar disease pattern.  Nonspecific ST-T abnormality, cannot exclude lateral ischemia.  Stress EKG is equivocal for ischemia, patient developing atypical left otherwise for with exercise which reverted back to baseline  immediately on combination of the stress test less than 60 seconds.  There was no additional ST-T wave changes of ischemia. Patient exercised on Bruce protocol for 4:25 minutes and achieved 5.45 METS. Stress  test terminated due to 89 % MPHR achieved (Target HR >85%). Symptoms included dizziness.  2.  2-Day protocol followed. Perfusion imaging studies demonstrate large sized severe perfusion defect involving the inferior, anterior and anterolateral consistent with inferior wall scar with moderate peri-infarct ischemia and severe anterior and anteroapical reversible ischemia extending from the base towards the apex.  Left ventricular systolic function calculating by QGS was markedly depressed at 26%.  This is a high risk study, consider further cardiac work-up.  Carotid artery duplex 02/16/2018: No hemodynamically significant arterial disease in the internal carotid artery bilaterally. Mild heterogenous plaque noted.  Antegrade right vertebral artery flow. Antegrade left vertebral artery flow. Compared to the study done on 09/01/2016, left carotid noted as occlusion is an error. This may perhaps be due to low velocity noted in both carotid arteries and may indicate low systemic BP or reduced cardiac output. Clinical correlation recommended.  Assessment:    3-vessel coronary artery disease - Plan: aspirin EC 81 MG tablet  Essential hypertension, benign  Hypercholesteremia  Coronary angiogram 09/08/2016: Low normal LVEF, 44-50% with apical akinesis. Occluded mid LAD, D2 mod sized 80% stenosis, OM1 bifurcates, occluded in the proximal segment. Large dominant RCA. 4 PL branches, large PL 1 with 80% stenosis, large PL 3 long segment occlusion. All CTO's have ipsilateral and contralateral faint collaterals. Calcification.  EKG 08/01/2018: Sinus bradycardia at the rate of 53 bpm, left axis deviation, poor R-wave progression, anteroseptal infarct old. Nonspecific T abnormality. Low-voltage complexes. No significant change from EKG 01/19/2018, 07/19/2017.  Laboratory examination: 01/19/2018: Cholesterol 115, triglycerides 112, HDL 39, LDL 54.  Glucose 125, creatinine 1.16, potassium 5.2, EGFR 67, CMP otherwise  normal.  CBC normal.  TSH 1.5.  05/24/2017: Creatinine 1.6, potassium 4.9, BMP otherwise normal.  Hemoglobin A1c 7.1%.  Liver enzymes normal.  Cholesterol 92, triglycerides 72, HDL 36, LDL 42.  Recommendation:   This was a virtual visit, patient essentially asymptomatic except for minimal symptoms of dyspnea that are remained stable.  Denies leg edema, he has not had recurrence of angina pectoris, he does carry nitroglycerin with him.  He has not checked his blood pressure, importance of blood pressure check and to keep systolic blood pressures less than 836 and diastolic less than 80 mmHg discussed.  His lipids were last checked a year ago,  Dr. Chalmers Cater who manages his diabetes mellitus, DM is improving. I will place a order for the lipid profile and CMP and also check TSH in 3 months prior to his OV.  I have discussed with him regarding regular exercise and weight loss, instead of seeing him in 6 months and plan to see him in person in 3 months.  This visit type was conducted due to national recommendations for restrictions regarding the COVID-19 Pandemic (e.g. social distancing).  This format is felt to be most appropriate for this patient at this time.  All issues noted in this document were discussed and addressed.  No physical exam was performed (except for noted visual exam findings with Telehealth visits).  The patient has consented to conduct a Telehealth visit and understands insurance will be billed. Physical exam is limited.   I connected with@, on 02/06/19 at  by a video enabled telemedicine application and verified that I am speaking with the correct person using two  identifiers.     I discussed the limitations of evaluation and management by telemedicine and the availability of in person appointments. The patient expressed understanding and agreed to proceed.   I have discussed with her regarding the safety during COVID Pandemic and steps and precautions including social distancing  with the patient. Tthis was a 18 min AV face to face encounter with additional 10 minutes on review of medical records and charting.     Adrian Prows, MD, [facility] 02/06/2019, 10:51 AM [facility] [place] Pager: [phone] Office: [phone] If no answer Cell [phone]

## 2019-02-14 DIAGNOSIS — N183 Chronic kidney disease, stage 3 (moderate): Secondary | ICD-10-CM | POA: Diagnosis not present

## 2019-02-14 DIAGNOSIS — I129 Hypertensive chronic kidney disease with stage 1 through stage 4 chronic kidney disease, or unspecified chronic kidney disease: Secondary | ICD-10-CM | POA: Diagnosis not present

## 2019-02-14 DIAGNOSIS — R809 Proteinuria, unspecified: Secondary | ICD-10-CM | POA: Diagnosis not present

## 2019-02-14 DIAGNOSIS — D631 Anemia in chronic kidney disease: Secondary | ICD-10-CM | POA: Diagnosis not present

## 2019-02-21 ENCOUNTER — Other Ambulatory Visit: Payer: Self-pay | Admitting: Podiatry

## 2019-02-21 ENCOUNTER — Encounter: Payer: Self-pay | Admitting: Family Medicine

## 2019-02-21 ENCOUNTER — Ambulatory Visit (INDEPENDENT_AMBULATORY_CARE_PROVIDER_SITE_OTHER): Payer: BLUE CROSS/BLUE SHIELD | Admitting: Podiatry

## 2019-02-21 ENCOUNTER — Ambulatory Visit (INDEPENDENT_AMBULATORY_CARE_PROVIDER_SITE_OTHER): Payer: BLUE CROSS/BLUE SHIELD

## 2019-02-21 ENCOUNTER — Ambulatory Visit: Payer: BLUE CROSS/BLUE SHIELD

## 2019-02-21 ENCOUNTER — Other Ambulatory Visit: Payer: Self-pay

## 2019-02-21 VITALS — Temp 95.2°F

## 2019-02-21 DIAGNOSIS — E1140 Type 2 diabetes mellitus with diabetic neuropathy, unspecified: Secondary | ICD-10-CM

## 2019-02-21 DIAGNOSIS — L03031 Cellulitis of right toe: Secondary | ICD-10-CM | POA: Diagnosis not present

## 2019-02-21 DIAGNOSIS — L02611 Cutaneous abscess of right foot: Secondary | ICD-10-CM | POA: Diagnosis not present

## 2019-02-21 DIAGNOSIS — I739 Peripheral vascular disease, unspecified: Secondary | ICD-10-CM

## 2019-02-21 DIAGNOSIS — L97519 Non-pressure chronic ulcer of other part of right foot with unspecified severity: Secondary | ICD-10-CM

## 2019-02-21 MED ORDER — CIPROFLOXACIN HCL 500 MG PO TABS: 500.0000 mg | ORAL_TABLET | Freq: Two times a day (BID) | ORAL | 0 refills | Status: DC

## 2019-02-21 MED ORDER — CLINDAMYCIN HCL 300 MG PO CAPS: 300.0000 mg | ORAL_CAPSULE | Freq: Three times a day (TID) | ORAL | 0 refills | Status: DC

## 2019-02-21 NOTE — Addendum Note (Signed)
Addended by: Cranford Mon R on: 02/21/2019 02:48 PM   Modules accepted: Orders

## 2019-02-21 NOTE — Addendum Note (Signed)
Addended by: Harriett Sine D on: 02/21/2019 11:39 AM   Modules accepted: Orders

## 2019-02-21 NOTE — Progress Notes (Signed)
Subjective: 63 year old male presents the office today for an acute appointment.  He states that over the weekend on Saturday he noticed a blister formed on the side of the right foot and he has been keeping the area wrapped but has gotten worse and has noticed some redness to the area.  He did notice a small purulence.  He previously was seen been vascular for circulation.  This is been decreased for some time.  He now sees Dr. Einar Gip for cardiovascular issues. denies any systemic complaints such as fevers, chills, nausea, vomiting. No acute changes since last appointment, and no other complaints at this time.   Objective: AAO x3, NAD DP/PT pulses decreased bilaterally Sensation decreased with Semmes-Weinstein monofilament There is a large blister present on the lateral aspect the right foot on the fifth metatarsal head no surrounding erythema.  There is fluctuation but there is no crepitation. Upon debridement there was purulence identified.. After debridement there was a superficial ulcer but there is no probing, undermining, tunneling. No further fluctuance after debridement. No further purulence identified after debridement.  No open lesions or pre-ulcerative lesions.  No pain with calf compression, swelling, warmth, erythema      Assessment: Right foot abscess, cellulitis  Plan: -All treatment options discussed with the patient including all alternatives, risks, complications.  -X-rays were obtained and reviewed.  There is soft tissue emphysema present to the lateral fifth metatarsal head.  This does correspond to the area of the large blister. -Today I prepped the skin with alcohol and I debrided more wound, blister in which there was purulent drainage identified.  I debrided all nonviable tissue with a #312 with scalpel as well as a tissue nipper. I took new x-rays after I debrided it and the soft tissue emphysema is no longer present. This corresponded to the blister/abscess that was  drained today.  -Will start clinda and cipro -Surgical shoe -Strict precautions to call immediately should any worsening or he must report directly to the ER.  -Will refer to Dr. Nadyne Coombes. He has known PAD and now with ulcer/infection.  -Patient encouraged to call the office with any questions, concerns, change in symptoms.   Trula Slade DPM

## 2019-02-21 NOTE — Patient Instructions (Signed)
Apply betadine to the wound daily. Start both antibiotics. If you notice any increase in redness, drainage, swelling or if you start to have a fever, chills or any "flu-like" symptoms you need to go to the ER.

## 2019-02-22 ENCOUNTER — Encounter: Payer: Self-pay | Admitting: Cardiology

## 2019-02-22 ENCOUNTER — Ambulatory Visit (INDEPENDENT_AMBULATORY_CARE_PROVIDER_SITE_OTHER): Payer: BLUE CROSS/BLUE SHIELD | Admitting: Cardiology

## 2019-02-22 VITALS — BP 144/58 | HR 60 | Ht 70.0 in | Wt 282.6 lb

## 2019-02-22 DIAGNOSIS — I2510 Atherosclerotic heart disease of native coronary artery without angina pectoris: Secondary | ICD-10-CM

## 2019-02-22 DIAGNOSIS — E1151 Type 2 diabetes mellitus with diabetic peripheral angiopathy without gangrene: Secondary | ICD-10-CM | POA: Diagnosis not present

## 2019-02-22 DIAGNOSIS — L98491 Non-pressure chronic ulcer of skin of other sites limited to breakdown of skin: Secondary | ICD-10-CM | POA: Diagnosis not present

## 2019-02-22 DIAGNOSIS — I739 Peripheral vascular disease, unspecified: Secondary | ICD-10-CM | POA: Diagnosis not present

## 2019-02-22 NOTE — Progress Notes (Signed)
  Subjective:  Primary Physician:  Knapp, Eve, MD  Patient ID: Eduardo [name], male    DOB: 05/08/1956, 62 y.o.   MRN: [id number]  Chief Complaint  Patient presents with  . Follow-up    Ulcer on right foot per Dr. Wagoner    HPI: Eduardo [name]  is a 62 y.o. male  with  coronary artery disease by angiography on 09/08/2016 revealing severe triple-vessel CAD with no targets for CABG, presently on aggressive medical therapy, mild carotid atherosclerosis, right SFA occlusion and small vessel disease involving both lower extremities below the knee by angiography in 2013, noticed ulceration and swelling in his right lateral aspect of his foot on 02/17/2019, being Friday, he dressed himself and was seen by Dr. Wagoner, podiatrist who deployed at the wound and referred him back to us for evaluation of critical limb ischemia and PAD. He has controlled type II diabetes, hyperlipidemia, and hypertension.   Patient has not felt any pain until 2 days ago started having throbbing pain in his ulcer.  Has had mild discharge but not foul-smelling.  No fever or chills. States that symptoms of claudication is stable with very minimal leg cramps and overall states that in general he feels essentially asymptomatic except for very minimal dyspnea on extremes of exertion.  Past Medical History:  Diagnosis Date  . Colon polyp   . Diabetes mellitus 1987   under care of Dr. Balan.  On insulin since 96 (off and on)  . Diabetic retinopathy   . Dupuytren contracture    R hand, s/p injection (Dr. Coley)  . Essential hypertension, benign   . Essential hypertension, benign 02/06/2019  . Frequency of urination and polyuria   . Hypertension   . Myocardial infarction (HCC)   . Neuromuscular disorder (HCC)    Diabetic neuropathy  . Other testicular hypofunction   . Peripheral arterial disease (HCC) 10/28/2012  . Peritoneal abscess (HCC) 6/08   and buttock.  . Polydipsia   . Proteinuria   . Pure hyperglyceridemia     Past Surgical History:  Procedure Laterality Date  . ABDOMINAL AORTAGRAM N/A 04/18/2012   Procedure: ABDOMINAL AORTAGRAM;  Surgeon: Christopher S Dickson, MD;  Location: [facility] CATH [facility];  Service: Cardiovascular;  Laterality: N/A;  . CARDIAC CATHETERIZATION N/A 09/08/2016   Procedure: Left Heart Cath and Coronary Angiography;  Surgeon: Surina Storts, MD;  Location: [facility] INVASIVE CV [facility];  Service: Cardiovascular;  Laterality: N/A;  . CATARACT EXTRACTION, BILATERAL  09/2017, 10/2017   Dr. Hecker  . LOWER EXTREMITY ANGIOGRAM Bilateral 04/18/2012   Procedure: LOWER EXTREMITY ANGIOGRAM;  Surgeon: Christopher S Dickson, MD;  Location: [facility] CATH [facility];  Service: Cardiovascular;  Laterality: Bilateral;  bilat lower extrem angio  . macular photocoagulation     (eye treatments for diabetic retinopathy)-Dr. Matthews    Social History   Socioeconomic History  . Marital status: Widowed    Spouse name: Not on file  . Number of children: 0  . Years of education: Not on file  . Highest education level: Not on file  Occupational History  . 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 Financial resource strain: Not on file  . Food insecurity:    Worry: Not on file    Inability: Not on file  . Transportation needs:    Medical: Not on file    Non-medical: Not on file  Tobacco Use  . Smoking status: Former Smoker      Packs/day: 1.00    Years: 30.00    Pack years: 30.00    Types: Cigarettes    Last attempt to quit: 01/08/2012    Years since quitting: 7.1  . Smokeless tobacco: Never Used  Substance and Sexual Activity  . Alcohol use: No  . Drug use: No  . Sexual activity: Not Currently  Lifestyle  . Physical activity:    Days per week: Not on file    Minutes per session: Not on file  . Stress: Not on file  Relationships  . Social connections:    Talks on phone: Not on file    Gets together: Not on file    Attends religious service: Not on  file    Active member of club or organization: Not on file    Attends meetings of clubs or organizations: Not on file    Relationship status: Not on file  . Intimate partner violence:    Fear of current or ex partner: Not on file    Emotionally abused: Not on file    Physically abused: Not on file    Forced sexual activity: Not on file  Other Topics Concern  . Not on file  Social History Narrative   Widowed. Previously traveled 40 weeks out of the year. No longer travels as much. No pets    Current Outpatient Medications on File Prior to Visit  Medication Sig Dispense Refill  . BESIVANCE 0.6 % SUSP   12  . Bioflavonoid Products (ESTER-C) 1000-50 MG TABS Take 1 tablet by mouth daily.     . Cholecalciferol (VITAMIN D) 2000 units CAPS Take 2,000 Units by mouth daily.    . ciprofloxacin (CIPRO) 500 MG tablet Take 1 tablet (500 mg total) by mouth 2 (two) times daily. 20 tablet 0  . clindamycin (CLEOCIN) 300 MG capsule Take 1 capsule (300 mg total) by mouth 3 (three) times daily. 30 capsule 0  . clopidogrel (PLAVIX) 75 MG tablet Take 75 mg by mouth daily.    . DM-APAP-CPM (CORICIDIN HBP PO) Take 2 tablets by mouth as needed.     . furosemide (LASIX) 20 MG tablet TK 1 T PO D  5  . gemfibrozil (LOPID) 600 MG tablet Take 600 mg by mouth 2 (two) times daily before a meal.      . HUMALOG KWIKPEN 100 UNIT/ML KiwkPen Inject 0-12 Units into the skin 2 (two) times daily. SLIDING SCALE DEPENDING ON BLOOD GLUCOSE BEFORE MEAL  1  . HUMULIN N KWIKPEN 100 UNIT/ML Kiwkpen Inject 30-40 Units into the skin 2 (two) times daily. A.m 40 units, P.m. 30 units  1  . isosorbide mononitrate (IMDUR) 60 MG 24 hr tablet Take 60 mg by mouth daily.    . lisinopril (PRINIVIL,ZESTRIL) 10 MG tablet TK 1 T PO D  3  . metoprolol succinate (TOPROL-XL) 100 MG 24 hr tablet Take 100 mg by mouth daily. Take with or immediately following a meal.    . Multiple Vitamin (MULTIVITAMIN) tablet Take 1 tablet by mouth daily.    .  nitroGLYCERIN (NITROSTAT) 0.4 MG SL tablet Place 0.4 mg under the tongue every 5 (five) minutes as needed for chest pain.  1  . Probiotic Product (PROBIOTIC-10 PO) Take 1 capsule by mouth daily.    . rosuvastatin (CRESTOR) 20 MG tablet Take 20 mg by mouth daily.  3  . B-D UF III MINI PEN NEEDLES 31G X 5 MM MISC U UTD 6 TIMES A DAY  0     No current facility-administered medications on file prior to visit.     Review of Systems  Constitution: Negative for chills, decreased appetite, malaise/fatigue and weight gain.  Cardiovascular: Positive for claudication (minimal), dyspnea on exertion (stable) and leg swelling (right foot ulceration and swelling and minimal discharge). Negative for syncope.  Endocrine: Negative for cold intolerance.  Hematologic/Lymphatic: Does not bruise/bleed easily.  Musculoskeletal: Negative for joint swelling.  Gastrointestinal: Negative for abdominal pain, anorexia and change in bowel habit.  Neurological: Negative for headaches and light-headedness.  Psychiatric/Behavioral: Negative for depression and substance abuse.  All other systems reviewed and are negative.      Objective:  Blood pressure (!) 144/58, pulse 60, height _0  (1.778 m), weight 282 lb 9.6 oz (128.2 kg), SpO2 95 %. Body mass index is 40.55 kg/m.  Physical Exam  Constitutional: He appears well-developed. No distress.  Morbidly obese  HENT:  Head: Atraumatic.  Eyes: Conjunctivae are normal.  Neck: Neck supple. No thyromegaly present.  Short neck and difficult to evaluate JVP  Cardiovascular: Exam reveals no gallop.  No murmur heard. Pulses:      Carotid pulses are 2+ on the right side and 2+ on the left side.      Dorsalis pedis pulses are 1+ on the right side and 1+ on the left side.       Posterior tibial pulses are 0 on the right side and 1+ on the left side.  Femoral and popliteal pulse difficult to feel due to patient's body habitus.  Right foot ulcer lateral aspect of the sole.   Pulmonary/Chest: Effort normal.  Abdominal: Soft.  Obese.   Musculoskeletal: Normal range of motion.        General: No edema.  Neurological: He is alert.  Skin: Skin is dry.  Psychiatric: He has a normal mood and affect.   Laboratory examination: 01/19/2018: Cholesterol 115, triglycerides 112, HDL 39, LDL 54.  Glucose 125, creatinine 1.16, potassium 5.2, EGFR 67, CMP otherwise normal.  CBC normal.  TSH 1.5.  05/24/2017: Creatinine 1.6, potassium 4.9, BMP otherwise normal.  Hemoglobin A1c 7.1%.  Liver enzymes normal.  Cholesterol 92, triglycerides 72, HDL 36, LDL 42.  CMP Latest Ref Rng & Units 06/06/2018 09/08/2016 04/18/2012  Glucose 65 - 99 mg/dL 171(H) 306(H) 90  BUN 8 - 27 mg/dL 30(H) 34(H) 35(H)  Creatinine 0.76 - 1.27 mg/dL 1.27 1.34(H) 1.60(H)  Sodium 134 - 144 mmol/L 142 138 143  Potassium 3.5 - 5.2 mmol/L 4.9 4.8 4.8  Chloride 96 - 106 mmol/L 102 110 111  CO2 20 - 29 mmol/L 26 22 -  Calcium 8.6 - 10.2 mg/dL 9.5 9.3 -  Total Protein 6.0 - 8.3 g/dL - - -  Total Bilirubin 0.3 - 1.2 mg/dL - - -  Alkaline Phos 39 - 117 U/L - - -  AST 0 - 37 U/L - - -  ALT 0 - 53 U/L - - -   CBC Latest Ref Rng & Units 04/18/2012 09/28/2011  WBC 4.0 - 10.5 K/uL - 13.0(H)  Hemoglobin 13.0 - 17.0 g/dL 13.6 15.4  Hematocrit 39.0 - 52.0 % 40.0 46.0  Platelets 150 - 400 K/uL - 275   Lipid Panel     Component Value Date/Time   CHOL 92 05/24/2017   TRIG 72 05/24/2017   HDL 36 05/24/2017   CHOLHDL 4.4 09/28/2011 1358   VLDL 21 09/28/2011 1358   LDLCALC 42 05/24/2017   HEMOGLOBIN A1C Lab Results  Component Value Date   HGBA1C  7.2 06/09/2018   MPG 192 (H) 09/28/2011   TSH No results for input(s): TSH in the last 8760 hours.  CARDIAC STUDIES:    Coronary angiogram 09/08/2016: Low normal LVEF, 44-50% with apical akinesis. Occluded mid LAD, D2 mod sized 80% stenosis, OM1 bifurcates, occluded in the proximal segment. Large dominant RCA. 4 PL branches, large PL 1 with 80% stenosis, large PL 3  long segment occlusion. All CTO's have ipsilateral and contralateral faint collaterals. Calcification.  Echocardiogram 09/01/2016: Left ventricle cavity is normal in size. Moderate concentric hypertrophy of the left ventricle. Mild to moderately reduced left ventricular function. Left ventricle regional wall motion findings: Poor echo window and reduced sensitivity.  There is inferior hypokinesis, lateral hypokinesis, mild global hypokinesis. Grade 1 diastolic dysfunction.  Calculated EF 40%. Left atrial cavity is mildly dilated at 4.2 cm. IVC is dilated with poor inspiration collapse consistent with elevated right atrial pressure.  Exercise sestamibi stress test 08/17/2016: 1. Resting EKG demonstrates normal sinus rhythm, left axis deviation, left plantar fascicular block.  Poor R-wave progression, ulnar disease pattern.  Nonspecific ST-T abnormality, cannot exclude lateral ischemia.  Stress EKG is equivocal for ischemia, patient developing atypical left otherwise for with exercise which reverted back to baseline immediately on combination of the stress test less than 60 seconds.  There was no additional ST-T wave changes of ischemia. Patient exercised on Bruce protocol for 4:25 minutes and achieved 5.45 METS. Stress test terminated due to 89 % MPHR achieved (Target HR >85%). Symptoms included dizziness.  2.  2-Day protocol followed. Perfusion imaging studies demonstrate large sized severe perfusion defect involving the inferior, anterior and anterolateral consistent with inferior wall scar with moderate peri-infarct ischemia and severe anterior and anteroapical reversible ischemia extending from the base towards the apex.  Left ventricular systolic function calculating by QGS was markedly depressed at 26%.  This is a high risk study, consider further cardiac work-up.  Carotid artery duplex 02/16/2018: No hemodynamically significant arterial disease in the internal carotid artery bilaterally. Mild  heterogenous plaque noted.  Antegrade right vertebral artery flow. Antegrade left vertebral artery flow. Compared to the study done on 09/01/2016, left carotid noted as occlusion is an error. This may perhaps be due to low velocity noted in both carotid arteries and may indicate low systemic BP or reduced cardiac output. Clinical correlation recommended.  ABI 06/10/2016: R ABI 0.72, L ABI 1.0  Peripheral arteriogram 04/18/2012:  Right SFA occluded from proximal to distal segment, reconstitutes at the level of the popliteal artery.  Two-vessel runoff in the form of peroneal and posterior tibial artery.  Left SFA mild disease, moderate disease in the peroneal and posterior tibial vessels, three-vessel runoff.   Assessment:    Peripheral artery disease (HCC)  Ischemic ulcer, limited to breakdown of skin (HCC) righ foot  3-vessel coronary artery disease  DM (diabetes mellitus), type 2 with peripheral vascular complications (HCC)   EKG 08/01/2018: Sinus bradycardia at the rate of 53 bpm, left axis deviation, poor R-wave progression, anteroseptal infarct old. Nonspecific T abnormality. Low-voltage complexes. No significant change from EKG 01/19/2018, 07/19/2017.  Recommendation:  Patient Has developed ischemic ulcer right foot probably in the PT distribution and with high risk for progression to non healing ulcer/infection or sepsis in view of diabetes mellitus. He will need peripheral arteriogram on an urgent basis.  As we are doing limited noninvasive testing in the office in view of Covid 19, I'll also perform ABI in the hospital prior to angiography if possible.  I have   reviewed the procedure with the patient, risks benefits including bleeding complications, embolic complication, infection and limb loss was discussed with the patient. Advised him that the procedure is to be done relatively urgently.  He has occlusion of the proximal right SFA up to the distal SFA, 2 vessel runoff in the form of  peroneal and posterior tibial arteries by angiography in 2013.  The lesion appears much improved since being on antibiotics and drainage performed yesterday.   From cardiac standpoint he has done well without angina pectoris or congestive heart failure.  He is on appropriate medical therapy with regard to his lipids.  Continue aspirin for now, in view of possible need for surgery as well, I've not started him on Plavix.   Adrian Prows, MD, [facility] 02/22/2019, 10:18 PM   [facility] [place] Pager: [phone] Office: [phone] If no answer Cell [phone]

## 2019-02-22 NOTE — H&P (View-Only) (Signed)
Subjective:  Primary Physician:  Rita Ohara, MD  Patient ID: Eduardo [name], male    DOB: [birth year], 63 y.o.   MRN: [id number]  Chief Complaint  Patient presents with  . Follow-up    Ulcer on right foot per Dr. Jacqualyn Posey    HPI: JEP DYAS  is a 63 y.o. male  with  coronary artery disease by angiography on 09/08/2016 revealing severe triple-vessel CAD with no targets for CABG, presently on aggressive medical therapy, mild carotid atherosclerosis, right SFA occlusion and small vessel disease involving both lower extremities below the knee by angiography in 2013, noticed ulceration and swelling in his right lateral aspect of his foot on 02/17/2019, being Friday, he dressed himself and was seen by Dr. Jacqualyn Posey, podiatrist who deployed at the wound and referred him back to [place] for evaluation of critical limb ischemia and PAD. He has controlled type II diabetes, hyperlipidemia, and hypertension.   Patient has not felt any pain until 2 days ago started having throbbing pain in his ulcer.  Has had mild discharge but not foul-smelling.  No fever or chills. States that symptoms of claudication is stable with very minimal leg cramps and overall states that in general he feels essentially asymptomatic except for very minimal dyspnea on extremes of exertion.  Past Medical History:  Diagnosis Date  . Colon polyp   . Diabetes mellitus 1987   under care of Dr. Chalmers Cater.  On insulin since 96 (off and on)  . Diabetic retinopathy   . Dupuytren contracture    R hand, s/p injection (Dr. Lenon Curt)  . Essential hypertension, benign   . Essential hypertension, benign 02/06/2019  . Frequency of urination and polyuria   . Hypertension   . Myocardial infarction ([place])   . Neuromuscular disorder ([place])    Diabetic neuropathy  . Other testicular hypofunction   . Peripheral arterial disease ([place]) 10/28/2012  . Peritoneal abscess ([place]) 6/08   and buttock.  . Polydipsia   . Proteinuria   . Pure hyperglyceridemia     Past Surgical History:  Procedure Laterality Date  . ABDOMINAL AORTAGRAM N/A 04/18/2012   Procedure: ABDOMINAL Maxcine Ham;  Surgeon: Angelia Mould, MD;  Location: [facility] CATH LAB;  Service: Cardiovascular;  Laterality: N/A;  . CARDIAC CATHETERIZATION N/A 09/08/2016   Procedure: Left Heart Cath and Coronary Angiography;  Surgeon: Adrian Prows, MD;  Location: [facility] CV LAB;  Service: Cardiovascular;  Laterality: N/A;  . CATARACT EXTRACTION, BILATERAL  09/2017, 10/2017   Dr. Herbert Deaner  . LOWER EXTREMITY ANGIOGRAM Bilateral 04/18/2012   Procedure: LOWER EXTREMITY ANGIOGRAM;  Surgeon: Angelia Mould, MD;  Location: [facility] CATH LAB;  Service: Cardiovascular;  Laterality: Bilateral;  bilat lower extrem angio  . macular photocoagulation     (eye treatments for diabetic retinopathy)-Dr. Zigmund Daniel    Social History   Socioeconomic History  . Marital status: Widowed    Spouse name: Not on file  . Number of children: 0  . Years of education: Not on file  . Highest education level: Not on file  Occupational History  . 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 Financial resource strain: Not on file  . Food insecurity:    Worry: Not on file    Inability: Not on file  . Transportation needs:    Medical: Not on file    Non-medical: Not on file  Tobacco Use  . Smoking status: Former Smoker  Packs/day: 1.00    Years: 30.00    Pack years: 30.00    Types: Cigarettes    Last attempt to quit: 01/08/2012    Years since quitting: 7.1  . Smokeless tobacco: Never Used  Substance and Sexual Activity  . Alcohol use: No  . Drug use: No  . Sexual activity: Not Currently  Lifestyle  . Physical activity:    Days per week: Not on file    Minutes per session: Not on file  . Stress: Not on file  Relationships  . Social connections:    Talks on phone: Not on file    Gets together: Not on file    Attends religious service: Not on  file    Active member of club or organization: Not on file    Attends meetings of clubs or organizations: Not on file    Relationship status: Not on file  . Intimate partner violence:    Fear of current or ex partner: Not on file    Emotionally abused: Not on file    Physically abused: Not on file    Forced sexual activity: Not on file  Other Topics Concern  . Not on file  Social History Narrative   Widowed. Previously traveled 40 weeks out of the year. No longer travels as much. No pets    Current Outpatient Medications on File Prior to Visit  Medication Sig Dispense Refill  . BESIVANCE 0.6 % SUSP   12  . Bioflavonoid Products (ESTER-C) 1000-50 MG TABS Take 1 tablet by mouth daily.     . Cholecalciferol (VITAMIN D) 2000 units CAPS Take 2,000 Units by mouth daily.    . ciprofloxacin (CIPRO) 500 MG tablet Take 1 tablet (500 mg total) by mouth 2 (two) times daily. 20 tablet 0  . clindamycin (CLEOCIN) 300 MG capsule Take 1 capsule (300 mg total) by mouth 3 (three) times daily. 30 capsule 0  . clopidogrel (PLAVIX) 75 MG tablet Take 75 mg by mouth daily.    Marland Kitchen DM-APAP-CPM (CORICIDIN HBP PO) Take 2 tablets by mouth as needed.     . furosemide (LASIX) 20 MG tablet TK 1 T PO D  5  . gemfibrozil (LOPID) 600 MG tablet Take 600 mg by mouth 2 (two) times daily before a meal.      . HUMALOG KWIKPEN 100 UNIT/ML KiwkPen Inject 0-12 Units into the skin 2 (two) times daily. SLIDING SCALE DEPENDING ON BLOOD GLUCOSE BEFORE MEAL  1  . HUMULIN N KWIKPEN 100 UNIT/ML Kiwkpen Inject 30-40 Units into the skin 2 (two) times daily. A.m 40 units, P.m. 30 units  1  . isosorbide mononitrate (IMDUR) 60 MG 24 hr tablet Take 60 mg by mouth daily.    Marland Kitchen lisinopril (PRINIVIL,ZESTRIL) 10 MG tablet TK 1 T PO D  3  . metoprolol succinate (TOPROL-XL) 100 MG 24 hr tablet Take 100 mg by mouth daily. Take with or immediately following a meal.    . Multiple Vitamin (MULTIVITAMIN) tablet Take 1 tablet by mouth daily.    .  nitroGLYCERIN (NITROSTAT) 0.4 MG SL tablet Place 0.4 mg under the tongue every 5 (five) minutes as needed for chest pain.  1  . Probiotic Product (PROBIOTIC-10 PO) Take 1 capsule by mouth daily.    . rosuvastatin (CRESTOR) 20 MG tablet Take 20 mg by mouth daily.  3  . B-D UF III MINI PEN NEEDLES 31G X 5 MM MISC U UTD 6 TIMES A DAY  0  No current facility-administered medications on file prior to visit.     Review of Systems  Constitution: Negative for chills, decreased appetite, malaise/fatigue and weight gain.  Cardiovascular: Positive for claudication (minimal), dyspnea on exertion (stable) and leg swelling (right foot ulceration and swelling and minimal discharge). Negative for syncope.  Endocrine: Negative for cold intolerance.  Hematologic/Lymphatic: Does not bruise/bleed easily.  Musculoskeletal: Negative for joint swelling.  Gastrointestinal: Negative for abdominal pain, anorexia and change in bowel habit.  Neurological: Negative for headaches and light-headedness.  Psychiatric/Behavioral: Negative for depression and substance abuse.  All other systems reviewed and are negative.      Objective:  Blood pressure (!) 144/58, pulse 60, height _0  (1.778 m), weight 282 lb 9.6 oz (128.2 kg), SpO2 95 %. Body mass index is 40.55 kg/m.  Physical Exam  Constitutional: He appears well-developed. No distress.  Morbidly obese  HENT:  Head: Atraumatic.  Eyes: Conjunctivae are normal.  Neck: Neck supple. No thyromegaly present.  Short neck and difficult to evaluate JVP  Cardiovascular: Exam reveals no gallop.  No murmur heard. Pulses:      Carotid pulses are 2+ on the right side and 2+ on the left side.      Dorsalis pedis pulses are 1+ on the right side and 1+ on the left side.       Posterior tibial pulses are 0 on the right side and 1+ on the left side.  Femoral and popliteal pulse difficult to feel due to patient's body habitus.  Right foot ulcer lateral aspect of the sole.   Pulmonary/Chest: Effort normal.  Abdominal: Soft.  Obese.   Musculoskeletal: Normal range of motion.        General: No edema.  Neurological: He is alert.  Skin: Skin is dry.  Psychiatric: He has a normal mood and affect.   Laboratory examination: 01/19/2018: Cholesterol 115, triglycerides 112, HDL 39, LDL 54.  Glucose 125, creatinine 1.16, potassium 5.2, EGFR 67, CMP otherwise normal.  CBC normal.  TSH 1.5.  05/24/2017: Creatinine 1.6, potassium 4.9, BMP otherwise normal.  Hemoglobin A1c 7.1%.  Liver enzymes normal.  Cholesterol 92, triglycerides 72, HDL 36, LDL 42.  CMP Latest Ref Rng & Units 06/06/2018 09/08/2016 04/18/2012  Glucose 65 - 99 mg/dL 171(H) 306(H) 90  BUN 8 - 27 mg/dL 30(H) 34(H) 35(H)  Creatinine 0.76 - 1.27 mg/dL 1.27 1.34(H) 1.60(H)  Sodium 134 - 144 mmol/L 142 138 143  Potassium 3.5 - 5.2 mmol/L 4.9 4.8 4.8  Chloride 96 - 106 mmol/L 102 110 111  CO2 20 - 29 mmol/L 26 22 -  Calcium 8.6 - 10.2 mg/dL 9.5 9.3 -  Total Protein 6.0 - 8.3 g/dL - - -  Total Bilirubin 0.3 - 1.2 mg/dL - - -  Alkaline Phos 39 - 117 U/L - - -  AST 0 - 37 U/L - - -  ALT 0 - 53 U/L - - -   CBC Latest Ref Rng & Units 04/18/2012 09/28/2011  WBC 4.0 - 10.5 K/uL - 13.0(H)  Hemoglobin 13.0 - 17.0 g/dL 13.6 15.4  Hematocrit 39.0 - 52.0 % 40.0 46.0  Platelets 150 - 400 K/uL - 275   Lipid Panel     Component Value Date/Time   CHOL 92 05/24/2017   TRIG 72 05/24/2017   HDL 36 05/24/2017   CHOLHDL 4.4 09/28/2011 1358   VLDL 21 09/28/2011 1358   LDLCALC 42 05/24/2017   HEMOGLOBIN A1C Lab Results  Component Value Date   HGBA1C  7.2 06/09/2018   MPG 192 (H) 09/28/2011   TSH No results for input(s): TSH in the last 8760 hours.  CARDIAC STUDIES:    Coronary angiogram 09/08/2016: Low normal LVEF, 44-50% with apical akinesis. Occluded mid LAD, D2 mod sized 80% stenosis, OM1 bifurcates, occluded in the proximal segment. Large dominant RCA. 4 PL branches, large PL 1 with 80% stenosis, large PL 3  long segment occlusion. All CTO's have ipsilateral and contralateral faint collaterals. Calcification.  Echocardiogram 09/01/2016: Left ventricle cavity is normal in size. Moderate concentric hypertrophy of the left ventricle. Mild to moderately reduced left ventricular function. Left ventricle regional wall motion findings: Poor echo window and reduced sensitivity.  There is inferior hypokinesis, lateral hypokinesis, mild global hypokinesis. Grade 1 diastolic dysfunction.  Calculated EF 40%. Left atrial cavity is mildly dilated at 4.2 cm. IVC is dilated with poor inspiration collapse consistent with elevated right atrial pressure.  Exercise sestamibi stress test 08/17/2016: 1. Resting EKG demonstrates normal sinus rhythm, left axis deviation, left plantar fascicular block.  Poor R-wave progression, ulnar disease pattern.  Nonspecific ST-T abnormality, cannot exclude lateral ischemia.  Stress EKG is equivocal for ischemia, patient developing atypical left otherwise for with exercise which reverted back to baseline immediately on combination of the stress test less than 60 seconds.  There was no additional ST-T wave changes of ischemia. Patient exercised on Bruce protocol for 4:25 minutes and achieved 5.45 METS. Stress test terminated due to 89 % MPHR achieved (Target HR >85%). Symptoms included dizziness.  2.  2-Day protocol followed. Perfusion imaging studies demonstrate large sized severe perfusion defect involving the inferior, anterior and anterolateral consistent with inferior wall scar with moderate peri-infarct ischemia and severe anterior and anteroapical reversible ischemia extending from the base towards the apex.  Left ventricular systolic function calculating by QGS was markedly depressed at 26%.  This is a high risk study, consider further cardiac work-up.  Carotid artery duplex 02/16/2018: No hemodynamically significant arterial disease in the internal carotid artery bilaterally. Mild  heterogenous plaque noted.  Antegrade right vertebral artery flow. Antegrade left vertebral artery flow. Compared to the study done on 09/01/2016, left carotid noted as occlusion is an error. This may perhaps be due to low velocity noted in both carotid arteries and may indicate low systemic BP or reduced cardiac output. Clinical correlation recommended.  ABI 06/10/2016: R ABI 0.72, L ABI 1.0  Peripheral arteriogram 04/18/2012:  Right SFA occluded from proximal to distal segment, reconstitutes at the level of the popliteal artery.  Two-vessel runoff in the form of peroneal and posterior tibial artery.  Left SFA mild disease, moderate disease in the peroneal and posterior tibial vessels, three-vessel runoff.   Assessment:    Peripheral artery disease (HCC)  Ischemic ulcer, limited to breakdown of skin ([place]) righ foot  3-vessel coronary artery disease  DM (diabetes mellitus), type 2 with peripheral vascular complications ([place])   EKG 08/01/2018: Sinus bradycardia at the rate of 53 bpm, left axis deviation, poor R-wave progression, anteroseptal infarct old. Nonspecific T abnormality. Low-voltage complexes. No significant change from EKG 01/19/2018, 07/19/2017.  Recommendation:  Patient Has developed ischemic ulcer right foot probably in the PT distribution and with high risk for progression to non healing ulcer/infection or sepsis in view of diabetes mellitus. He will need peripheral arteriogram on an urgent basis.  As we are doing limited noninvasive testing in the office in view of Covid 19, I'll also perform ABI in the hospital prior to angiography if possible.  I have  reviewed the procedure with the patient, risks benefits including bleeding complications, embolic complication, infection and limb loss was discussed with the patient. Advised him that the procedure is to be done relatively urgently.  He has occlusion of the proximal right SFA up to the distal SFA, 2 vessel runoff in the form of  peroneal and posterior tibial arteries by angiography in 2013.  The lesion appears much improved since being on antibiotics and drainage performed yesterday.   From cardiac standpoint he has done well without angina pectoris or congestive heart failure.  He is on appropriate medical therapy with regard to his lipids.  Continue aspirin for now, in view of possible need for surgery as well, I've not started him on Plavix.   Adrian Prows, MD, [facility] 02/22/2019, 10:18 PM   [facility]. [place] Pager: [phone] Office: [phone] If no answer Cell [phone]

## 2019-02-23 ENCOUNTER — Telehealth: Payer: Self-pay | Admitting: Podiatry

## 2019-02-23 DIAGNOSIS — E1151 Type 2 diabetes mellitus with diabetic peripheral angiopathy without gangrene: Secondary | ICD-10-CM | POA: Diagnosis not present

## 2019-02-23 NOTE — Telephone Encounter (Signed)
Patient needs to to reshcedule his wound check appointment due to angiogram. He would like Dr. Jacqualyn Posey to contact Dr. Adrian Prows with Cardiology to determine when he can come back.

## 2019-02-23 NOTE — Telephone Encounter (Signed)
I would have him come in sooner than later. I don't want to push it out.

## 2019-02-24 ENCOUNTER — Telehealth: Payer: Self-pay | Admitting: *Deleted

## 2019-02-24 LAB — CBC
Hematocrit: 45.9 % (ref 37.5–51.0)
Hemoglobin: 15.8 g/dL (ref 13.0–17.7)
MCH: 31.3 pg (ref 26.6–33.0)
MCHC: 34.4 g/dL (ref 31.5–35.7)
MCV: 91 fL (ref 79–97)
Platelets: 211 10*3/uL (ref 150–450)
RBC: 5.05 x10E6/uL (ref 4.14–5.80)
RDW: 12.8 % (ref 11.6–15.4)
WBC: 7.9 10*3/uL (ref 3.4–10.8)

## 2019-02-24 LAB — WOUND CULTURE
MICRO NUMBER:: 394581
SPECIMEN QUALITY:: ADEQUATE

## 2019-02-24 LAB — BASIC METABOLIC PANEL
BUN/Creatinine Ratio: 23 (ref 10–24)
BUN: 32 mg/dL — ABNORMAL HIGH (ref 8–27)
CO2: 24 mmol/L (ref 20–29)
Calcium: 9.2 mg/dL (ref 8.6–10.2)
Chloride: 103 mmol/L (ref 96–106)
Creatinine, Ser: 1.4 mg/dL — ABNORMAL HIGH (ref 0.76–1.27)
GFR calc Af Amer: 61 mL/min/{1.73_m2} (ref >59)
GFR calc non Af Amer: 53 mL/min/{1.73_m2} — ABNORMAL LOW (ref >59)
Glucose: 85 mg/dL (ref 65–99)
Potassium: 4.6 mmol/L (ref 3.5–5.2)
Sodium: 142 mmol/L (ref 134–144)

## 2019-02-24 NOTE — Telephone Encounter (Signed)
I informed pt of Dr. Leigh Aurora review of results and orders, I told pt Dr. Jacqualyn Posey had stated he would prefer to see pt sooner than later and transferred to scheduler to be scheduled for 03/02/2019.

## 2019-02-24 NOTE — Telephone Encounter (Signed)
-----   Message from Trula Slade, DPM sent at 02/24/2019  8:20 AM EDT ----- Continue clinda/cipro  Val- can you let him know to continue his current antibiotics.

## 2019-02-24 NOTE — Telephone Encounter (Signed)
Left message on pt's home phone and informed of Dr. Leigh Aurora review of results and orders.

## 2019-02-28 ENCOUNTER — Ambulatory Visit: Payer: BLUE CROSS/BLUE SHIELD | Admitting: Podiatry

## 2019-02-28 ENCOUNTER — Encounter (HOSPITAL_COMMUNITY)
Admission: RE | Disposition: A | Discharge status: Home / Self Care | Payer: Self-pay | Source: Home / Self Care | Attending: Cardiology

## 2019-02-28 ENCOUNTER — Ambulatory Visit (HOSPITAL_COMMUNITY)
Admission: RE | Admit: 2019-02-28 | Discharge: 2019-02-28 | Disposition: A | Discharge status: Home / Self Care | Payer: BLUE CROSS/BLUE SHIELD | Attending: Cardiology | Admitting: Cardiology

## 2019-02-28 ENCOUNTER — Other Ambulatory Visit: Payer: Self-pay

## 2019-02-28 DIAGNOSIS — Z87891 Personal history of nicotine dependence: Secondary | ICD-10-CM | POA: Diagnosis not present

## 2019-02-28 DIAGNOSIS — I70238 Atherosclerosis of native arteries of right leg with ulceration of other part of lower right leg: Secondary | ICD-10-CM

## 2019-02-28 DIAGNOSIS — L97519 Non-pressure chronic ulcer of other part of right foot with unspecified severity: Secondary | ICD-10-CM | POA: Diagnosis not present

## 2019-02-28 DIAGNOSIS — M720 Palmar fascial fibromatosis [Dupuytren]: Secondary | ICD-10-CM | POA: Insufficient documentation

## 2019-02-28 DIAGNOSIS — E11319 Type 2 diabetes mellitus with unspecified diabetic retinopathy without macular edema: Secondary | ICD-10-CM | POA: Insufficient documentation

## 2019-02-28 DIAGNOSIS — E11621 Type 2 diabetes mellitus with foot ulcer: Secondary | ICD-10-CM | POA: Diagnosis not present

## 2019-02-28 DIAGNOSIS — I10 Essential (primary) hypertension: Secondary | ICD-10-CM | POA: Diagnosis not present

## 2019-02-28 DIAGNOSIS — L97209 Non-pressure chronic ulcer of unspecified calf with unspecified severity: Secondary | ICD-10-CM

## 2019-02-28 DIAGNOSIS — I2510 Atherosclerotic heart disease of native coronary artery without angina pectoris: Secondary | ICD-10-CM | POA: Insufficient documentation

## 2019-02-28 DIAGNOSIS — Z79899 Other long term (current) drug therapy: Secondary | ICD-10-CM | POA: Insufficient documentation

## 2019-02-28 DIAGNOSIS — Z6841 Body Mass Index (BMI) 40.0 and over, adult: Secondary | ICD-10-CM | POA: Insufficient documentation

## 2019-02-28 DIAGNOSIS — E1140 Type 2 diabetes mellitus with diabetic neuropathy, unspecified: Secondary | ICD-10-CM | POA: Insufficient documentation

## 2019-02-28 DIAGNOSIS — I252 Old myocardial infarction: Secondary | ICD-10-CM | POA: Insufficient documentation

## 2019-02-28 DIAGNOSIS — E7800 Pure hypercholesterolemia, unspecified: Secondary | ICD-10-CM | POA: Insufficient documentation

## 2019-02-28 DIAGNOSIS — I739 Peripheral vascular disease, unspecified: Secondary | ICD-10-CM

## 2019-02-28 DIAGNOSIS — E1151 Type 2 diabetes mellitus with diabetic peripheral angiopathy without gangrene: Secondary | ICD-10-CM | POA: Diagnosis not present

## 2019-02-28 DIAGNOSIS — Z794 Long term (current) use of insulin: Secondary | ICD-10-CM | POA: Insufficient documentation

## 2019-02-28 DIAGNOSIS — I70235 Atherosclerosis of native arteries of right leg with ulceration of other part of foot: Secondary | ICD-10-CM | POA: Insufficient documentation

## 2019-02-28 DIAGNOSIS — I7025 Atherosclerosis of native arteries of other extremities with ulceration: Secondary | ICD-10-CM | POA: Diagnosis present

## 2019-02-28 DIAGNOSIS — Z7902 Long term (current) use of antithrombotics/antiplatelets: Secondary | ICD-10-CM | POA: Diagnosis not present

## 2019-02-28 HISTORY — PX: LOWER EXTREMITY ANGIOGRAPHY: CATH118251

## 2019-02-28 HISTORY — PX: PERIPHERAL VASCULAR INTERVENTION: CATH118257

## 2019-02-28 LAB — POCT ACTIVATED CLOTTING TIME
Activated Clotting Time: 263 seconds
Activated Clotting Time: 257 seconds
Activated Clotting Time: 252 seconds

## 2019-02-28 LAB — GLUCOSE, CAPILLARY: Glucose-Capillary: 145 mg/dL — ABNORMAL HIGH (ref 70–99)

## 2019-02-28 SURGERY — LOWER EXTREMITY ANGIOGRAPHY
Anesthesia: LOCAL | Laterality: Bilateral

## 2019-02-28 MED ORDER — LIDOCAINE HCL (PF) 1 % IJ SOLN
INTRAMUSCULAR | Status: AC
  Filled 2019-02-28: qty 30

## 2019-02-28 MED ORDER — FENTANYL CITRATE (PF) 100 MCG/2ML IJ SOLN
INTRAMUSCULAR | Status: DC | PRN
  Administered 2019-02-28 (×2): 25 ug via INTRAVENOUS
  Administered 2019-02-28 (×2): 50 ug via INTRAVENOUS
  Administered 2019-02-28: 25 ug via INTRAVENOUS
  Administered 2019-02-28 (×2): 50 ug via INTRAVENOUS

## 2019-02-28 MED ORDER — SODIUM CHLORIDE 0.9% FLUSH: 3.0000 mL | Freq: Two times a day (BID) | INTRAVENOUS | Status: DC

## 2019-02-28 MED ORDER — OXYCODONE HCL 5 MG PO TABS: 5.0000 mg | ORAL_TABLET | ORAL | Status: DC | PRN

## 2019-02-28 MED ORDER — HEPARIN SODIUM (PORCINE) 1000 UNIT/ML IJ SOLN
INTRAMUSCULAR | Status: AC
  Filled 2019-02-28: qty 1

## 2019-02-28 MED ORDER — HEPARIN (PORCINE) IN NACL 1000-0.9 UT/500ML-% IV SOLN
INTRAVENOUS | Status: DC | PRN
  Administered 2019-02-28 (×2): 500 mL

## 2019-02-28 MED ORDER — FENTANYL CITRATE (PF) 100 MCG/2ML IJ SOLN
INTRAMUSCULAR | Status: AC
  Filled 2019-02-28: qty 2

## 2019-02-28 MED ORDER — SODIUM CHLORIDE 0.9 % IV BOLUS
500.0000 mL | Freq: Once | INTRAVENOUS | Status: AC
  Administered 2019-02-28: 500 mL via INTRAVENOUS

## 2019-02-28 MED ORDER — MIDAZOLAM HCL 2 MG/2ML IJ SOLN
INTRAMUSCULAR | Status: AC
  Filled 2019-02-28: qty 2

## 2019-02-28 MED ORDER — SODIUM CHLORIDE 0.9 % IV SOLN: 250.0000 mL | INTRAVENOUS | Status: DC | PRN

## 2019-02-28 MED ORDER — SODIUM CHLORIDE 0.9 % IV SOLN: INTRAVENOUS | Status: DC

## 2019-02-28 MED ORDER — CEFAZOLIN SODIUM-DEXTROSE 2-3 GM-%(50ML) IV SOLR
INTRAVENOUS | Status: AC | PRN
  Administered 2019-02-28: 2 g via INTRAVENOUS

## 2019-02-28 MED ORDER — SODIUM CHLORIDE 0.9 % WEIGHT BASED INFUSION: 1.0000 mL/kg/h | INTRAVENOUS | Status: DC

## 2019-02-28 MED ORDER — HYDRALAZINE HCL 20 MG/ML IJ SOLN: 5.0000 mg | INTRAMUSCULAR | Status: DC | PRN

## 2019-02-28 MED ORDER — HEPARIN (PORCINE) IN NACL 1000-0.9 UT/500ML-% IV SOLN
INTRAVENOUS | Status: AC
  Filled 2019-02-28: qty 1000

## 2019-02-28 MED ORDER — ONDANSETRON HCL 4 MG/2ML IJ SOLN: 4.0000 mg | Freq: Four times a day (QID) | INTRAMUSCULAR | Status: DC | PRN

## 2019-02-28 MED ORDER — SODIUM CHLORIDE 0.9% FLUSH: 3.0000 mL | INTRAVENOUS | Status: DC | PRN

## 2019-02-28 MED ORDER — CEFAZOLIN SODIUM-DEXTROSE 2-4 GM/100ML-% IV SOLN
INTRAVENOUS | Status: AC
  Filled 2019-02-28: qty 100

## 2019-02-28 MED ORDER — ASPIRIN 81 MG PO TABS: 81.0000 mg | ORAL_TABLET | Freq: Every day | ORAL | Status: DC

## 2019-02-28 MED ORDER — IODIXANOL 320 MG/ML IV SOLN
INTRAVENOUS | Status: DC | PRN
  Administered 2019-02-28: 10:00:00 180 mL via INTRAVENOUS

## 2019-02-28 MED ORDER — HEPARIN SODIUM (PORCINE) 1000 UNIT/ML IJ SOLN
INTRAMUSCULAR | Status: DC | PRN
  Administered 2019-02-28: 10000 [IU] via INTRAVENOUS
  Administered 2019-02-28 (×2): 3000 [IU] via INTRAVENOUS

## 2019-02-28 MED ORDER — LIDOCAINE HCL (PF) 1 % IJ SOLN
INTRAMUSCULAR | Status: DC | PRN
  Administered 2019-02-28: 15 mL

## 2019-02-28 MED ORDER — MIDAZOLAM HCL 2 MG/2ML IJ SOLN
INTRAMUSCULAR | Status: DC | PRN
  Administered 2019-02-28 (×2): 1 mg via INTRAVENOUS
  Administered 2019-02-28: 2 mg via INTRAVENOUS

## 2019-02-28 MED ORDER — NITROGLYCERIN 1 MG/10 ML FOR IR/CATH LAB
INTRA_ARTERIAL | Status: DC | PRN
  Administered 2019-02-28: 600 mL via INTRACORONARY

## 2019-02-28 MED ORDER — ACETAMINOPHEN 325 MG PO TABS: 650.0000 mg | ORAL_TABLET | ORAL | Status: DC | PRN

## 2019-02-28 MED ORDER — NITROGLYCERIN 1 MG/10 ML FOR IR/CATH LAB
INTRA_ARTERIAL | Status: AC
  Filled 2019-02-28: qty 10

## 2019-02-28 SURGICAL SUPPLY — 34 items
BALLN COYOTE OTW 2X150X150 (BALLOONS) ×3
BALLN STERLING OTW 5X220X150 (BALLOONS) ×3
BALLOON COYOTE OTW 2X150X150 (BALLOONS) ×2 IMPLANT
BALLOON STERLING OTW 5X220X150 (BALLOONS) ×2 IMPLANT
CATH ANGIO 5F PIGTAIL 65CM (CATHETERS) ×3 IMPLANT
CATH CXI SUPP 2.6F 150 ST (CATHETERS) ×3 IMPLANT
CATH MUSTANG 3X20X135 (BALLOONS) ×3 IMPLANT
CATH NAVICROSS ST .035X135CM (MICROCATHETER) ×6 IMPLANT
CATH SOFT-VU ST 4F 90CM (CATHETERS) ×3 IMPLANT
DEVICE CLOSURE PERCLS PRGLD 6F (VASCULAR PRODUCTS) ×2 IMPLANT
GLIDEWIRE ADV .035X260CM (WIRE) ×3 IMPLANT
GUIDEWIRE ASTATO XS 20G 300CM (WIRE) ×3 IMPLANT
KIT ENCORE 26 ADVANTAGE (KITS) ×3 IMPLANT
KIT ESSENTIALS PG (KITS) ×3 IMPLANT
KIT MICROPUNCTURE NIT STIFF (SHEATH) ×3 IMPLANT
KIT PV (KITS) ×3 IMPLANT
PACK UNIVERSAL I (CUSTOM PROCEDURE TRAY) ×3 IMPLANT
PERCLOSE PROGLIDE 6F (VASCULAR PRODUCTS) ×3
SHEATH FLEXOR ANSEL 1 7F 45CM (SHEATH) ×3 IMPLANT
SHEATH PINNACLE 5F 10CM (SHEATH) ×3 IMPLANT
SHEATH PINNACLE 7F 10CM (SHEATH) ×3 IMPLANT
SHEATH PROBE COVER 6X72 (BAG) ×3 IMPLANT
STENT ELUVIA 6X100X130 (Permanent Stent) ×3 IMPLANT
STENT ELUVIA 6X120X130 (Permanent Stent) ×6 IMPLANT
SYR MEDRAD MARK 7 150ML (SYRINGE) ×3 IMPLANT
TAPE VIPERTRACK RADIOPAQ (MISCELLANEOUS) ×4 IMPLANT
TAPE VIPERTRACK RADIOPAQUE (MISCELLANEOUS) ×2
TRANSDUCER W/STOPCOCK (MISCELLANEOUS) ×3 IMPLANT
TRAY PV CATH (CUSTOM PROCEDURE TRAY) ×3 IMPLANT
WIRE HI TORQ COMMND ES.014X300 (WIRE) ×3 IMPLANT
WIRE HITORQ VERSACORE ST 145CM (WIRE) ×3 IMPLANT
WIRE ROSEN-J .035X180CM (WIRE) ×3 IMPLANT
WIRE SPARTACORE .014X300CM (WIRE) ×3 IMPLANT
WIRE VERSACORE LOC 115CM (WIRE) ×3 IMPLANT

## 2019-02-28 NOTE — Interval H&P Note (Signed)
History and Physical Interval Note:  02/28/2019 7:40 AM  Eduardo [name]  has presented today for surgery, with the diagnosis of Cladication.  The various methods of treatment have been discussed with the patient and family. After consideration of risks, benefits and other options for treatment, the patient has consented to  Procedure(s): LOWER EXTREMITY ANGIOGRAPHY (Bilateral) and possible interventino and angioplasty as a surgical intervention.  The patient's history has been reviewed, patient examined, no change in status, stable for surgery.  I have reviewed the patient's chart and labs.  Questions were answered to the patient's satisfaction.     Adrian Prows

## 2019-02-28 NOTE — Discharge Instructions (Signed)
Femoral Site Care °This sheet gives you information about how to care for yourself after your procedure. Your health care provider may also give you more specific instructions. If you have problems or questions, contact your health care provider. °What can I expect after the procedure? °After the procedure, it is common to have: °· Bruising that usually fades within 1-2 weeks. °· Tenderness at the site. °Follow these instructions at home: °Wound care °· Follow instructions from your health care provider about how to take care of your insertion site. Make sure you: °? Wash your hands with soap and water before you change your bandage (dressing). If soap and water are not available, use hand sanitizer. °? Change your dressing as told by your health care provider. °? Leave stitches (sutures), skin glue, or adhesive strips in place. These skin closures may need to stay in place for 2 weeks or longer. If adhesive strip edges start to loosen and curl up, you may trim the loose edges. Do not remove adhesive strips completely unless your health care provider tells you to do that. °· Do not take baths, swim, or use a hot tub until your health care provider approves. °· You may shower 24-48 hours after the procedure or as told by your health care provider. °? Gently wash the site with plain soap and water. °? Pat the area dry with a clean towel. °? Do not rub the site. This may cause bleeding. °· Do not apply powder or lotion to the site. Keep the site clean and dry. °· Check your femoral site every day for signs of infection. Check for: °? Redness, swelling, or pain. °? Fluid or blood. °? Warmth. °? Pus or a bad smell. °Activity °· For the first 2-3 days after your procedure, or as long as directed: °? Avoid climbing stairs as much as possible. °? Do not squat. °· Do not lift anything that is heavier than 10 lb (4.5 kg), or the limit that you are told, until your health care provider says that it is safe. °· Rest as  directed. °? Avoid sitting for a long time without moving. Get up to take short walks every 1-2 hours. °· Do not drive for 24 hours if you were given a medicine to help you relax (sedative). °General instructions °· Take over-the-counter and prescription medicines only as told by your health care provider. °· Keep all follow-up visits as told by your health care provider. This is important. °Contact a health care provider if you have: °· A fever or chills. °· You have redness, swelling, or pain around your insertion site. °Get help right away if: °· The catheter insertion area swells very fast. °· You pass out. °· You suddenly start to sweat or your skin gets clammy. °· The catheter insertion area is bleeding, and the bleeding does not stop when you hold steady pressure on the area. °· The area near or just beyond the catheter insertion site becomes pale, cool, tingly, or numb. °These symptoms may represent a serious problem that is an emergency. Do not wait to see if the symptoms will go away. Get medical help right away. Call your local emergency services (911 in the [place] Do not drive yourself to the hospital. °Summary °· After the procedure, it is common to have bruising that usually fades within 1-2 weeks. °· Check your femoral site every day for signs of infection. °· Do not lift anything that is heavier than 10 lb (4.5 kg), or the   limit that you are told, until your health care provider says that it is safe. °This information is not intended to replace advice given to you by your health care provider. Make sure you discuss any questions you have with your health care provider. °Document Released: 06/29/2014 Document Revised: 11/08/2017 Document Reviewed: 11/08/2017 °Elsevier Interactive Patient Education © 2019 [employer] ° °

## 2019-02-28 NOTE — Progress Notes (Signed)
Gave d/c instructions via telephone to girlfriend Island McDermot.  All questions and answered and Erline Levine verbalized understanding of instructions

## 2019-02-28 NOTE — Progress Notes (Signed)
No bleeding or hematoma noted after ambulation 

## 2019-03-01 ENCOUNTER — Encounter (HOSPITAL_COMMUNITY): Payer: Self-pay | Admitting: Cardiology

## 2019-03-02 ENCOUNTER — Encounter: Payer: Self-pay | Admitting: Podiatry

## 2019-03-02 ENCOUNTER — Other Ambulatory Visit: Payer: Self-pay

## 2019-03-02 ENCOUNTER — Ambulatory Visit (INDEPENDENT_AMBULATORY_CARE_PROVIDER_SITE_OTHER): Payer: BLUE CROSS/BLUE SHIELD | Admitting: Podiatry

## 2019-03-02 VITALS — Temp 97.3°F

## 2019-03-02 DIAGNOSIS — L97519 Non-pressure chronic ulcer of other part of right foot with unspecified severity: Secondary | ICD-10-CM | POA: Diagnosis not present

## 2019-03-02 DIAGNOSIS — L02611 Cutaneous abscess of right foot: Secondary | ICD-10-CM

## 2019-03-02 DIAGNOSIS — L03031 Cellulitis of right toe: Secondary | ICD-10-CM | POA: Diagnosis not present

## 2019-03-02 DIAGNOSIS — E1140 Type 2 diabetes mellitus with diabetic neuropathy, unspecified: Secondary | ICD-10-CM

## 2019-03-02 DIAGNOSIS — I739 Peripheral vascular disease, unspecified: Secondary | ICD-10-CM | POA: Diagnosis not present

## 2019-03-02 MED ORDER — CLINDAMYCIN HCL 300 MG PO CAPS: 300.0000 mg | ORAL_CAPSULE | Freq: Three times a day (TID) | ORAL | 0 refills | Status: DC

## 2019-03-06 NOTE — Progress Notes (Signed)
Subjective: 63 year old male presents the office today for follow-up evaluation of a wound on the right foot, abscess and cellulitis.  Since I last saw him he underwent angiogram with stenting.  He states he still antibiotics.  He is overall doing better.  Denies any drainage or pus or any red streaks.  He still in surgical shoe as well. Denies any systemic complaints such as fevers, chills, nausea, vomiting. No acute changes since last appointment, and no other complaints at this time.   Objective: AAO x3, NAD DP/PT pulses palpable CRT less than 3 seconds Ulceration on the right foot fifth metatarsal head which is superficial with a granular base.  Mild surrounding erythema but overall is improved.  Is no drainage or pus there is no ascending cellulitis.  There is no fluctuation crepitation any malodor.  Today the ulcer measures 3.5 x 1.8 cm. No open lesions or pre-ulcerative lesions.  No pain with calf compression, swelling, warmth, erythema  Assessment: Right foot ulcer with resolving infection; PAD  Plan: -All treatment options discussed with the patient including all alternatives, risks, complications.  -Continue antibiotics and refill clindamycin for him to continue.  Continue daily dressing changes with antibiotic ointment and a bandage daily -Offloading. I modified his surgical shoe to help keep further pressure off the area and also dispensed offloading pads. -Monitor for any clinical signs or symptoms of infection and directed to call the office immediately should any occur or go to the ER. -Patient encouraged to call the office with any questions, concerns, change in symptoms.   Return in about 1 week (around 03/09/2019).  Trula Slade DPM

## 2019-03-07 ENCOUNTER — Ambulatory Visit: Payer: BLUE CROSS/BLUE SHIELD | Admitting: Podiatry

## 2019-03-08 NOTE — Progress Notes (Signed)
Primary Physician/Referring:  Rita Ohara, MD  Patient ID: Eduardo [name], male    DOB: [birth year], 63 y.o.   MRN: [id number]  Chief Complaint  Patient presents with   PAD    cath f/u     HPI: Eduardo [name]  is a 63 y.o. male  with controlled type 2 diabetes, hypertension, hyperlipidemia, coronary artery disease by angiography on 09/08/2016 revealing severe triple-vessel CAD with no targets for CABG, presently on aggressive medical therapy, mild carotid atherosclerosis, right SFA occlusion and small vessel disease involving both lower extremities below the knee by angiography in 2013, with recently noted ulceration and swelling in his right lateral aspect of his foot on 02/17/2019.   Due to critical limb ischemia, underwent complex PV angiogram on 02/28/2019 with PTA to occluded right SFA with implantation of 3 overlapping 6.0 x 120 x2; 6.0 x 100 mm Eluvia DES.  He is now here for post PV follow up. He feels essentially asymptomatic except for very minimal dyspnea on extremes of exertion. States his ulcer has no further discharge and is healing well and able to bear the weight. He has noticed similar dark lesion in the left foot at the sole of the little toe.   Past Medical History:  Diagnosis Date   Colon polyp    Diabetes mellitus 1987   under care of Dr. Chalmers Cater.  On insulin since 96 (off and on)   Diabetic retinopathy    Dupuytren contracture    R hand, s/p injection (Dr. Lenon Curt)   Essential hypertension, benign    Essential hypertension, benign 02/06/2019   Frequency of urination and polyuria    Hypertension    Myocardial infarction [facility])    Neuromuscular disorder ([place])    Diabetic neuropathy   Other testicular hypofunction    Peripheral arterial disease ([place]) 10/28/2012   Peritoneal abscess ([place]) 6/08   and buttock.   Polydipsia    Proteinuria    Pure hyperglyceridemia     Past Surgical History:  Procedure Laterality Date   ABDOMINAL AORTAGRAM N/A  04/18/2012   Procedure: ABDOMINAL Maxcine Ham;  Surgeon: Angelia Mould, MD;  Location: [facility] CATH LAB;  Service: Cardiovascular;  Laterality: N/A;   CARDIAC CATHETERIZATION N/A 09/08/2016   Procedure: Left Heart Cath and Coronary Angiography;  Surgeon: Adrian Prows, MD;  Location: [place] CV LAB;  Service: Cardiovascular;  Laterality: N/A;   CATARACT EXTRACTION, BILATERAL  09/2017, 10/2017   Dr. Herbert Deaner   LOWER EXTREMITY ANGIOGRAM Bilateral 04/18/2012   Procedure: LOWER EXTREMITY ANGIOGRAM;  Surgeon: Angelia Mould, MD;  Location: [facility] CATH LAB;  Service: Cardiovascular;  Laterality: Bilateral;  bilat lower extrem angio   LOWER EXTREMITY ANGIOGRAPHY Bilateral 02/28/2019   Procedure: LOWER EXTREMITY ANGIOGRAPHY;  Surgeon: Adrian Prows, MD;  Location: [place] CV LAB;  Service: Cardiovascular;  Laterality: Bilateral;   macular photocoagulation     (eye treatments for diabetic retinopathy)-Dr. 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)    Employer: [employer]  Social Needs   Financial resource strain: Not  on file   Food insecurity:    Worry: Not on file    Inability: Not on file   Transportation needs:    Medical: Not on file    Non-medical: Not on file  Tobacco Use   Smoking status: Former Smoker    Packs/day: 1.00    Years: 30.00    Pack years: 30.00    Types: Cigarettes    Last attempt to quit: 01/08/2012    Years since quitting: 7.1   Smokeless tobacco: Never Used  Substance and Sexual Activity   Alcohol use: No   Drug use: No     Sexual activity: Not Currently  Lifestyle   Physical activity:    Days per week: Not on file    Minutes per session: Not on file   Stress: Not on file  Relationships   Social connections:    Talks on phone: Not on file    Gets together: Not on file    Attends religious service: Not on file    Active member of club or organization: Not on file    Attends meetings of clubs or organizations: Not on file    Relationship status: Not on file   Intimate partner violence:    Fear of current or ex partner: Not on file    Emotionally abused: Not on file    Physically abused: Not on file    Forced sexual activity: Not on file  Other Topics Concern   Not on file  Social History Narrative   Widowed. Previously traveled 40 weeks out of the year. No longer travels as much. No pets    Current Outpatient Medications on File Prior to Visit  Medication Sig Dispense Refill   B-D UF III MINI PEN NEEDLES 31G X 5 MM MISC U UTD 6 TIMES A DAY  0   Bioflavonoid Products (ESTER-C) 1000-50 MG TABS Take 1 tablet by mouth daily.      Cholecalciferol (VITAMIN D) 2000 units CAPS Take 2,000 Units by mouth daily.     clindamycin (CLEOCIN) 300 MG capsule Take 1 capsule (300 mg total) by mouth 3 (three) times daily. 30 capsule 0   clopidogrel (PLAVIX) 75 MG tablet Take 75 mg by mouth daily.     furosemide (LASIX) 20 MG tablet Take 20 mg by mouth daily.   5   gemfibrozil (LOPID) 600 MG tablet Take 600 mg by mouth 2 (two) times daily before a meal.       HUMALOG KWIKPEN 100 UNIT/ML KiwkPen Inject 0-12 Units into the skin 2 (two) times daily. SLIDING SCALE DEPENDING ON BLOOD GLUCOSE BEFORE MEAL  1   HUMULIN N KWIKPEN 100 UNIT/ML Kiwkpen Inject 30-40 Units into the skin See admin instructions. A.m 40 units, P.m. 30 units  1   isosorbide mononitrate (IMDUR) 60 MG 24 hr tablet Take 60 mg by mouth daily.     Krill Oil 350 MG CAPS Take 350 mg by mouth daily. Move Free Ultra Krill Oil     lisinopril  (PRINIVIL,ZESTRIL) 10 MG tablet Take 10 mg by mouth daily.   3   metoprolol succinate (TOPROL-XL) 100 MG 24 hr tablet Take 100 mg by mouth daily. Take with or immediately following a meal.     Multiple Vitamin (MULTIVITAMIN) tablet Take 1 tablet by mouth daily.     nitroGLYCERIN (NITROSTAT) 0.4 MG SL tablet Place 0.4 mg under the tongue every 5 (five) minutes as needed for chest pain.  1   Probiotic Product (  PROBIOTIC-10 PO) Take 1 capsule by mouth daily.     rosuvastatin (CRESTOR) 20 MG tablet Take 20 mg by mouth daily.  3   aspirin 81 MG tablet Take 1 tablet (81 mg total) by mouth daily. (Patient not taking: Reported on 03/09/2019) 30 tablet    No current facility-administered medications on file prior to visit.     Review of Systems  Constitution: Negative for chills, decreased appetite, malaise/fatigue and weight gain.  Cardiovascular: Positive for claudication (minimal) and dyspnea on exertion (stable). Negative for leg swelling and syncope.       Ulcer right foot, healing  Endocrine: Negative for cold intolerance.  Hematologic/Lymphatic: Does not bruise/bleed easily.  Musculoskeletal: Negative for joint swelling.  Gastrointestinal: Negative for abdominal pain, anorexia and change in bowel habit.  Neurological: Negative for headaches and light-headedness.  Psychiatric/Behavioral: Negative for depression and substance abuse.  All other systems reviewed and are negative.     Objective  Blood pressure (!) 128/58, pulse 62, temperature 97.6 F (36.4 C), height 5\' 10"  (1.778 m), weight 282 lb (127.9 kg), SpO2 97 %. Body mass index is 40.46 kg/m.    Physical Exam  Constitutional: He appears well-developed. No distress.  Morbidly obese  HENT:  Head: Atraumatic.  Eyes: Conjunctivae are normal.  Neck: Neck supple. No thyromegaly present.  Short neck and difficult to evaluate JVP  Cardiovascular: Exam reveals no gallop.  No murmur heard. Pulses:      Carotid pulses are 2+ on  the right side and 2+ on the left side.      Dorsalis pedis pulses are 2+ on the right side and 1+ on the left side.       Posterior tibial pulses are 0 on the right side and 1+ on the left side.  Femoral and popliteal pulse difficult to feel due to patient's body habitus. Left groin has minimal ecchymosis.  Right foot ulcer lateral aspect of the sole is healing very well with granulation tissue without e.o infection.  Left foot similarly has a dark spot with appearance of ischemic skin change. .  Pulmonary/Chest: Effort normal.  Abdominal: Soft.  Obese.   Musculoskeletal: Normal range of motion.        General: No edema.  Neurological: He is alert.  Skin: Skin is dry.  Psychiatric: He has a normal mood and affect.   Radiology: No results found.  Laboratory examination:    CMP Latest Ref Rng & Units 02/23/2019 06/06/2018 09/08/2016  Glucose 65 - 99 mg/dL 85 171(H) 306(H)  BUN 8 - 27 mg/dL 32(H) 30(H) 34(H)  Creatinine 0.76 - 1.27 mg/dL 1.40(H) 1.27 1.34(H)  Sodium 134 - 144 mmol/L 142 142 138  Potassium 3.5 - 5.2 mmol/L 4.6 4.9 4.8  Chloride 96 - 106 mmol/L 103 102 110  CO2 20 - 29 mmol/L 24 26 22   Calcium 8.6 - 10.2 mg/dL 9.2 9.5 9.3  Total Protein 6.0 - 8.3 g/dL - - -  Total Bilirubin 0.3 - 1.2 mg/dL - - -  Alkaline Phos 39 - 117 U/L - - -  AST 0 - 37 U/L - - -  ALT 0 - 53 U/L - - -   CBC Latest Ref Rng & Units 02/23/2019 04/18/2012 09/28/2011  WBC 3.4 - 10.8 x10E3/uL 7.9 - 13.0(H)  Hemoglobin 13.0 - 17.7 g/dL 15.8 13.6 15.4  Hematocrit 37.5 - 51.0 % 45.9 40.0 46.0  Platelets 150 - 450 x10E3/uL 211 - 275   Lipid Panel     Component  Value Date/Time   CHOL 92 05/24/2017   TRIG 72 05/24/2017   HDL 36 05/24/2017   CHOLHDL 4.4 09/28/2011 1358   VLDL 21 09/28/2011 1358   LDLCALC 42 05/24/2017   HEMOGLOBIN A1C Lab Results  Component Value Date   HGBA1C 7.2 06/09/2018   MPG 192 (H) 09/28/2011   TSH No results for input(s): TSH in the last 8760 hours.  Cardiac  Studies:   Peripheral arteriogram 02/28/2019: Pelvic aortogram with limited bifemoral arteriogram revealed patent distal abdominal aorta without aneurysm, patent, bilateral iliac and common femoral vessels. Right SFA is occluded in the ostium and reconstitutes outside of the Hunter's canal.  Below the right knee there is two-vessel runoff, AT is occluded.  Left leg was not studied beyond left common femoral artery. Successful PTA and stenting of right SFA, prolonged procedure, difficult procedure with use of multiple balloons, guidewires and catheters.  100% stenosis reduced to 0% with implantation of 3 overlapping 6.0 x 120 x2; 6.0 x 100 mm Eluvia DES. 180 mL contrast utilized.  Coronary angiogram 09/08/2016: Low normal LVEF, 44-50% with apical akinesis. Occluded mid LAD, D2 mod sized 80% stenosis, OM1 bifurcates, occluded in the proximal segment. Large dominant RCA. 4 PL branches, large PL 1 with 80% stenosis, large PL 3 long segment occlusion. All CTO's have ipsilateral and contralateral faint collaterals. Calcification.  Echocardiogram 09/01/2016: Left ventricle cavity is normal in size. Moderate concentric hypertrophy of the left ventricle. Mild to moderately reduced left ventricular function. Left ventricle regional wall motion findings: Poor echo window and reduced sensitivity. There is inferior hypokinesis, lateral hypokinesis, mild global hypokinesis. Grade 1 diastolic dysfunction. Calculated EF 40%. Left atrial cavity is mildly dilated at 4.2 cm. IVC is dilated with poor inspiration collapse consistent with elevated right atrial pressure.  Exercise sestamibi stress test 08/17/2016: 1. Resting EKG demonstrates normal sinus rhythm, left axis deviation, left plantar fascicular block. Poor R-wave progression, ulnar disease pattern. Nonspecific ST-T abnormality, cannot exclude lateral ischemia. Stress EKG is equivocal for ischemia, patient developing atypical left otherwise for with  exercise which reverted back to baseline immediately on combination of the stress test less than 60 seconds. There was no additional ST-T wave changes of ischemia. Patient exercised on Bruce protocol for 4:25 minutes and achieved 5.45 METS. Stress test terminated due to 89 % MPHR achieved (Target HR >85%). Symptoms included dizziness.  2. 2-Day protocol followed. Perfusion imaging studies demonstrate large sized severe perfusion defect involving the inferior, anterior and anterolateral consistent with inferior wall scar with moderate peri-infarct ischemia and severe anterior and anteroapical reversible ischemia extending from the base towards the apex. Left ventricular systolic function calculating by QGS was markedly depressed at 26%. This is a high risk study, consider further cardiac work-up.  Carotid artery duplex 02/16/2018: No hemodynamically significant arterial disease in the internal carotid artery bilaterally. Mild heterogenous plaque noted.  Antegrade right vertebral artery flow. Antegrade left vertebral artery flow. Compared to the study done on 09/01/2016, left carotid noted as occlusion is an error. This may perhaps be due to low velocity noted in both carotid arteries and may indicate low systemic BP or reduced cardiac output. Clinical correlation recommended.  ABI 06/10/2016: R ABI 0.72, L ABI 1.0  Peripheral arteriogram 04/18/2012:  Right SFA occluded from proximal to distal segment, reconstitutes at the level of the popliteal artery.  Two-vessel runoff in the form of peroneal and posterior tibial artery.  Left SFA mild disease, moderate disease in the peroneal and posterior tibial vessels, three-vessel runoff.  Assessment   Peripheral artery disease (HCC) - Plan: PCV LOWER ARTERIAL (BILATERAL)  Ischemic ulcer, limited to breakdown of skin ([place]) righ foot - Plan: PCV LOWER ARTERIAL (BILATERAL)  DM (diabetes mellitus), type 2 with peripheral vascular complications  (HCC)  Essential hypertension, benign  EKG 08/01/2018: Sinus bradycardia at the rate of 53 bpm, left axis deviation, poor R-wave progression, anteroseptal infarct old.  Nonspecific T abnormality.  Low-voltage complexes. No significant change from EKG 01/19/2018, 07/19/2017.  Recommendations:   He is here in the office for follow-up of peripheral artery disease S/O right SFA stent on 02/28/19 with 6.0 x 120 x2; 6.0 x 100 mm Eluvia DES. He now presents follow-up of ischemic ulcer involving the right foot.  He has not noticed similar but early lesion in the left foot lateral aspect just below the 5th toe however there is no skin breakdown.  He had successful but complex intervention to his right SFA occlusion and has had successful revascularization by overlapping stents.  He will need lower extremity arterial duplex as it was not previously performed to establish a baseline in view of Covid 19 testing was not performed for ABI or lower extremity arterial duplex.  However in view of acute ischemic leg with proceed with testing.  I'll see him back in the office in one month however he may need repeat peripheral angiography to look at his left leg.  Left lower extremity was not studied in view of renal insufficiency.  I have again discussed with him regarding weight loss.  From cardiac standpoint he has not had any angina pectoris, blood pressure is well controlled, he is presently tolerating dual antiplatelet therapy.  Adrian Prows, MD, [facility] 03/09/2019, 11:26 AM [facility]. [place] Pager: [phone] Office: [phone] If no answer Cell [phone]

## 2019-03-09 ENCOUNTER — Encounter: Payer: Self-pay | Admitting: Cardiology

## 2019-03-09 ENCOUNTER — Other Ambulatory Visit: Payer: Self-pay

## 2019-03-09 ENCOUNTER — Ambulatory Visit: Payer: BLUE CROSS/BLUE SHIELD | Admitting: Cardiology

## 2019-03-09 VITALS — BP 128/58 | HR 62 | Temp 97.6°F | Ht 70.0 in | Wt 282.0 lb

## 2019-03-09 DIAGNOSIS — E1151 Type 2 diabetes mellitus with diabetic peripheral angiopathy without gangrene: Secondary | ICD-10-CM

## 2019-03-09 DIAGNOSIS — L98491 Non-pressure chronic ulcer of skin of other sites limited to breakdown of skin: Secondary | ICD-10-CM

## 2019-03-09 DIAGNOSIS — I739 Peripheral vascular disease, unspecified: Secondary | ICD-10-CM | POA: Diagnosis not present

## 2019-03-09 DIAGNOSIS — I10 Essential (primary) hypertension: Secondary | ICD-10-CM

## 2019-03-10 ENCOUNTER — Ambulatory Visit (INDEPENDENT_AMBULATORY_CARE_PROVIDER_SITE_OTHER): Payer: BLUE CROSS/BLUE SHIELD | Admitting: Podiatry

## 2019-03-10 DIAGNOSIS — E1140 Type 2 diabetes mellitus with diabetic neuropathy, unspecified: Secondary | ICD-10-CM

## 2019-03-10 DIAGNOSIS — D689 Coagulation defect, unspecified: Secondary | ICD-10-CM | POA: Diagnosis not present

## 2019-03-10 DIAGNOSIS — L97519 Non-pressure chronic ulcer of other part of right foot with unspecified severity: Secondary | ICD-10-CM

## 2019-03-10 DIAGNOSIS — L84 Corns and callosities: Secondary | ICD-10-CM

## 2019-03-13 NOTE — Progress Notes (Signed)
Subjective: 63 year old male presents the office today for follow-up evaluation of a wound on the right foot, abscess and cellulitis.  Betadine the area daily he thinks is getting better.  He is concerned about he is developing a small spot on the left foot.  Denies any drainage or pus coming from either area denies any increase in swelling or redness. Denies any systemic complaints such as fevers, chills, nausea, vomiting. No acute changes since last appointment, and no other complaints at this time.   Objective: AAO x3, NAD DP/PT pulses palpable CRT less than 3 seconds Ulceration on the right foot fifth metatarsal head which is superficial with a granular base.  Mild surrounding erythema but overall is improved.  Is no drainage or pus there is no ascending cellulitis.  There is no fluctuation crepitation any malodor.  Today the ulcer measures 2.5 x 1.5 cm. On the left foot submetatarsal head is a hyperkeratotic lesion.  Upon debridement this area is pre-ulcerative but there is no drainage or pus identified.  There is no swelling.  There is no erythema or warmth.  No ascending cellulitis. No open lesions or pre-ulcerative lesions.  No pain with calf compression, swelling, warmth, erythema  Assessment: Right foot ulcer with resolving infection; PAD  Plan: -All treatment options discussed with the patient including all alternatives, risks, complications.  -Finish course of antibiotics.  -I debrided the wound on the right foot utilizing a #312 with the help of bleeding tissue.  Pre-and post wound measurements are the same.  We are going to switch to using Medihoney on the wound daily.  Continue offloading. -On the left foot debrided the hyperkeratotic lesion with complications of bleeding to reveal a pre-ulcerative area.  Monitoring for the skin breakdown or any signs or symptoms of infection.  Return in about 10 days (around 03/20/2019).  Trula Slade DPM

## 2019-03-21 ENCOUNTER — Other Ambulatory Visit: Payer: Self-pay

## 2019-03-21 ENCOUNTER — Encounter: Payer: Self-pay | Admitting: Podiatry

## 2019-03-21 ENCOUNTER — Ambulatory Visit (INDEPENDENT_AMBULATORY_CARE_PROVIDER_SITE_OTHER): Payer: BLUE CROSS/BLUE SHIELD | Admitting: Podiatry

## 2019-03-21 VITALS — Temp 96.6°F

## 2019-03-21 DIAGNOSIS — L97519 Non-pressure chronic ulcer of other part of right foot with unspecified severity: Secondary | ICD-10-CM | POA: Diagnosis not present

## 2019-03-21 DIAGNOSIS — D689 Coagulation defect, unspecified: Secondary | ICD-10-CM

## 2019-03-21 DIAGNOSIS — E1140 Type 2 diabetes mellitus with diabetic neuropathy, unspecified: Secondary | ICD-10-CM

## 2019-03-27 NOTE — Progress Notes (Signed)
Subjective: 63 year old male presents the office today for follow-up evaluation of a wound on the right foot, abscess and cellulitis.  Overall he states that he has been doing better he thinks the wound is healing.  He has been continuing with the medihoney dressing changes daily.  He states that there may be a small amount of drainage on the bandage but he thinks it may be coming from the appointments.  He denies any cellulitis or red streaks.  He has no other concerns today. Denies any systemic complaints such as fevers, chills, nausea, vomiting. No acute changes since last appointment, and no other complaints at this time.   Objective: AAO x3, NAD Neurovascular unchanged Ulceration on the right foot fifth metatarsal head which is superficial with a granular base.  Mild surrounding erythema but overall is improved.  Is no drainage or pus there is no ascending cellulitis.  There is no fluctuation crepitation any malodor.  Today the ulcer measures 2.3 x 1.4 cm. On the left foot submetatarsal head is a hyperkeratotic lesion.  This area is pre-ulcerative there is no drainage or pus there is no surrounding erythema, ascending cellulitis.  There is no fluctuation crepitation malodor. No pain with calf compression, swelling, warmth, erythema  Assessment: Right foot ulcer with resolving infection; PAD  Plan: -All treatment options discussed with the patient including all alternatives, risks, complications.  -I debrided the wound on the right foot utilizing a #312 with the help of bleeding tissue.  Pre-and post wound measurements are the same.  We are going to switch to using Actisorb on the wound daily.  Continue offloading. -Debrided hyperkeratotic lesion left foot.  Continue all times.  Monitoring for the skin breakdown or any signs or symptoms of infection.  Return in about 1 week (around 03/28/2019).   Trula Slade DPM

## 2019-03-28 ENCOUNTER — Encounter: Payer: Self-pay | Admitting: Podiatry

## 2019-03-28 ENCOUNTER — Other Ambulatory Visit: Payer: Self-pay

## 2019-03-28 ENCOUNTER — Ambulatory Visit (INDEPENDENT_AMBULATORY_CARE_PROVIDER_SITE_OTHER): Payer: BLUE CROSS/BLUE SHIELD | Admitting: Podiatry

## 2019-03-28 VITALS — Temp 96.8°F

## 2019-03-28 DIAGNOSIS — B351 Tinea unguium: Secondary | ICD-10-CM

## 2019-03-28 DIAGNOSIS — L84 Corns and callosities: Secondary | ICD-10-CM

## 2019-03-28 DIAGNOSIS — D689 Coagulation defect, unspecified: Secondary | ICD-10-CM

## 2019-03-28 DIAGNOSIS — E1140 Type 2 diabetes mellitus with diabetic neuropathy, unspecified: Secondary | ICD-10-CM | POA: Diagnosis not present

## 2019-03-28 DIAGNOSIS — M79676 Pain in unspecified toe(s): Secondary | ICD-10-CM

## 2019-03-28 MED ORDER — SILVER SULFADIAZINE 1 % EX CREA: 1.0000 "application " | TOPICAL_CREAM | Freq: Every day | CUTANEOUS | 0 refills | Status: DC

## 2019-03-30 ENCOUNTER — Other Ambulatory Visit: Payer: BLUE CROSS/BLUE SHIELD

## 2019-04-06 ENCOUNTER — Telehealth: Payer: Self-pay | Admitting: Podiatry

## 2019-04-06 ENCOUNTER — Ambulatory Visit (INDEPENDENT_AMBULATORY_CARE_PROVIDER_SITE_OTHER): Payer: BLUE CROSS/BLUE SHIELD | Admitting: Podiatry

## 2019-04-06 ENCOUNTER — Encounter: Payer: Self-pay | Admitting: Podiatry

## 2019-04-06 ENCOUNTER — Other Ambulatory Visit: Payer: Self-pay

## 2019-04-06 VITALS — Temp 96.6°F

## 2019-04-06 DIAGNOSIS — E1140 Type 2 diabetes mellitus with diabetic neuropathy, unspecified: Secondary | ICD-10-CM

## 2019-04-06 DIAGNOSIS — L97529 Non-pressure chronic ulcer of other part of left foot with unspecified severity: Secondary | ICD-10-CM | POA: Diagnosis not present

## 2019-04-06 DIAGNOSIS — D689 Coagulation defect, unspecified: Secondary | ICD-10-CM

## 2019-04-06 DIAGNOSIS — L02612 Cutaneous abscess of left foot: Secondary | ICD-10-CM | POA: Diagnosis not present

## 2019-04-06 MED ORDER — TRAMADOL HCL 50 MG PO TABS: 50.0000 mg | ORAL_TABLET | Freq: Two times a day (BID) | ORAL | 0 refills | Status: AC | PRN

## 2019-04-06 MED ORDER — CLINDAMYCIN HCL 300 MG PO CAPS: 300.0000 mg | ORAL_CAPSULE | Freq: Three times a day (TID) | ORAL | 0 refills | Status: DC

## 2019-04-06 NOTE — Telephone Encounter (Signed)
Order was received for patient but they are missing the depth of the patients wound on the order. Please send an updated order.

## 2019-04-06 NOTE — Progress Notes (Signed)
Subjective: 63 year old male presents the office today for follow-up evaluation of a wound on the right foot as well as appropriate severe in the left foot.  Overall he seems to be doing well and states that the foot is getting better.  He denies any drainage or pus.  He denies any increase in swelling or redness or any red streaks.  Is completed course of clindamycin. Denies any systemic complaints such as fevers, chills, nausea, vomiting. No acute changes since last appointment, and no other complaints at this time.   His last A1c was 8.2 in January 2020.   Objective: AAO x3, NAD Neurovascular unchanged Ulceration on the right foot fifth metatarsal head which is superficial with a granular base.  Mild surrounding erythema but overall is improving appears to be starting to scab over.  To the wound measures 2.1 x 1.2 cm.  There is no surrounding erythema, ascending cellulitis.  Is no fluctuation crepitation however. Pre-ulcerative hyperkeratotic lesion left foot submetatarsal 5.  There is no underlying ulceration, drainage or any signs of infection but we need to keep a close eye on this area. No pain with calf compression, swelling, warmth, erythema  Assessment: Right foot ulcer with resolving infection; PAD.  Left foot pre-ulcerative lesion   Plan: -All treatment options discussed with the patient including all alternatives, risks, complications.  -I debrided the wound on the right foot utilizing a #312 with the help of bleeding tissue.  Pre-and post wound measurements are the same.  Continue the dressing changes as discussed -Encourage glucose control. -Debrided hyperkeratotic lesion left foot.  Continue all times.  Monitoring for the skin breakdown or any signs or symptoms of infection.  Follow-up in 7 to 10 days.  Trula Slade DPM

## 2019-04-06 NOTE — Progress Notes (Addendum)
Subjective: 63 year old male presents the office today for follow-up evaluation of a wound on the right foot as well as for a callus that he has been experiencing on the left foot.  He does state that he was on his feet quite a bit yesterday.  Last night he started noticing increasing pain to the left foot as well as a blister to his left foot.  He has been keeping Silvadene on the wound on the right foot.  He denies any fevers, chills, nausea, vomiting.  No calf pain, chest pain, shortness of breath.  His last A1c was 8.2 in January 2020.   Objective: AAO x3, NAD Neurovascular unchanged Ulceration on the right foot fifth metatarsal head which is superficial with a granular base.  There is no surrounding erythema, ascending cellulitis there is no fluctuation crepitation any malodor.  Minimal hyperkeratotic tissue.  Has been wearing a surgical shoe with the offloading.  Today the wound measures about the same size of 2.1 x 1.2 cm x 0.1. Today callus formation on the left foot, dorsal 5 and there is a blister present just adjacent to the fifth metatarsal head.  Upon debridement there is underlying wound measuring 1.5 x 1 cm.  There is a blister just lateral to this I did not really debride all the skin.  There was some purulence identified this was cultured.  There is not slight erythema and mild streaking dorsal lateral foot to the metatarsal shaft area.  No significant swelling to the foot there is no increase in warmth to the contralateral extremity.  No malodor. No pain with calf compression, swelling, warmth, erythema  Assessment: Right foot ulcer with resolving infection; PAD.  Left foot wound, abscess  Plan: -All treatment options discussed with the patient including all alternatives, risks, complications.  -I lightly debrided the wound on the right side to some of the mild hyperkeratotic periwound.  Continue Silvadene dressing changes for now continue with surgical shoe and offloading and  limit weightbearing. -I debrided the hyperkeratotic tissue as well as decompress blister on the left foot.  Wound culture was obtained today.  Would not restart clindamycin because of previously.  Monitor closely for signs or symptoms worsen infection and further streaking.  If this were to happen he is to the emergency room we discussed precautions for this.  A surgical shoe was dispensed for the left side with offloading.  This was modified today as well to help offload the wound. -He is going to follow up with tomorrow with Dr. Einar Gip for circulation. -If no improvement in wounds will likely refer to the wound care center. I am going to try to order endoform for the right foot.   RTC 1 week or sooner if needed  Trula Slade DPM

## 2019-04-07 ENCOUNTER — Telehealth: Payer: Self-pay | Admitting: *Deleted

## 2019-04-07 ENCOUNTER — Ambulatory Visit (INDEPENDENT_AMBULATORY_CARE_PROVIDER_SITE_OTHER): Payer: BLUE CROSS/BLUE SHIELD

## 2019-04-07 ENCOUNTER — Other Ambulatory Visit: Payer: Self-pay

## 2019-04-07 DIAGNOSIS — L98491 Non-pressure chronic ulcer of skin of other sites limited to breakdown of skin: Secondary | ICD-10-CM | POA: Diagnosis not present

## 2019-04-07 DIAGNOSIS — I739 Peripheral vascular disease, unspecified: Secondary | ICD-10-CM

## 2019-04-07 NOTE — Telephone Encounter (Signed)
[facility] - Dan requested wound measurements. I informed Linna Hoff of yesterday's wound measurments and he states he will ship pt's supplies today.

## 2019-04-09 ENCOUNTER — Telehealth: Payer: Self-pay | Admitting: Podiatry

## 2019-04-09 NOTE — Telephone Encounter (Signed)
This diabetic patient called stating the infection on his left foot is climbing up his left foot.  He was seen on Thursday by Dr.  Jacqualyn Posey who prescribed clindamycin.  I contacted Dr.  Jacqualyn Posey and consulted with him.  He recommended the clindamycin should be stopped and prescribed  Augmentin and Cipro.  These two prescriptions were called into [facility] on [address].  The patient was told to go to ER if the foot worsens and/or he could make an appointment with Dr.  Jacqualyn Posey tomorrow. Patient does relate the foot is bandaged.     Gardiner Barefoot DPM

## 2019-04-10 ENCOUNTER — Other Ambulatory Visit: Payer: Self-pay | Admitting: Cardiology

## 2019-04-10 LAB — WOUND CULTURE
MICRO NUMBER:: 515702
SPECIMEN QUALITY:: ADEQUATE

## 2019-04-10 NOTE — Telephone Encounter (Signed)
Please fill

## 2019-04-13 ENCOUNTER — Ambulatory Visit: Payer: BLUE CROSS/BLUE SHIELD | Admitting: Podiatry

## 2019-04-14 ENCOUNTER — Other Ambulatory Visit: Payer: Self-pay

## 2019-04-14 ENCOUNTER — Encounter: Payer: Self-pay | Admitting: Cardiology

## 2019-04-14 ENCOUNTER — Ambulatory Visit (INDEPENDENT_AMBULATORY_CARE_PROVIDER_SITE_OTHER): Payer: BC Managed Care – PPO | Admitting: Cardiology

## 2019-04-14 VITALS — BP 135/64 | HR 65 | Temp 97.3°F | Ht 70.0 in | Wt 283.0 lb

## 2019-04-14 DIAGNOSIS — I10 Essential (primary) hypertension: Secondary | ICD-10-CM | POA: Diagnosis not present

## 2019-04-14 DIAGNOSIS — E7800 Pure hypercholesterolemia, unspecified: Secondary | ICD-10-CM

## 2019-04-14 DIAGNOSIS — L98491 Non-pressure chronic ulcer of skin of other sites limited to breakdown of skin: Secondary | ICD-10-CM | POA: Diagnosis not present

## 2019-04-14 DIAGNOSIS — I2510 Atherosclerotic heart disease of native coronary artery without angina pectoris: Secondary | ICD-10-CM | POA: Diagnosis not present

## 2019-04-14 DIAGNOSIS — E1151 Type 2 diabetes mellitus with diabetic peripheral angiopathy without gangrene: Secondary | ICD-10-CM

## 2019-04-14 DIAGNOSIS — I739 Peripheral vascular disease, unspecified: Secondary | ICD-10-CM | POA: Diagnosis not present

## 2019-04-14 DIAGNOSIS — G609 Hereditary and idiopathic neuropathy, unspecified: Secondary | ICD-10-CM | POA: Diagnosis not present

## 2019-04-14 DIAGNOSIS — E1165 Type 2 diabetes mellitus with hyperglycemia: Secondary | ICD-10-CM | POA: Diagnosis not present

## 2019-04-14 LAB — HEMOGLOBIN A1C: Hemoglobin A1C: 7.2

## 2019-04-14 NOTE — Progress Notes (Signed)
Primary Physician/Referring:  Rita Ohara, MD  Patient ID: Eduardo [name], male    DOB: August 28, 1956, 63 y.o.   MRN: [id number]  Chief Complaint  Patient presents with  . PAD    4 week f/u, swelling from ulcers, test results    HPI: Eduardo [name]  is a 63 y.o. male  with controlled type 2 diabetes, hypertension, hyperlipidemia, coronary artery disease by angiography on 09/08/2016 revealing severe triple-vessel CAD with no targets for CABG, presently on aggressive medical therapy, mild carotid atherosclerosis, right SFA occlusion S/P complex PV angiogram on 02/28/2019 with PTA to occluded right SFA with implantation of 3 overlapping 6.0 x 120 x2; 6.0 x 100 mm Eluvia DES. Since then the ulcer is healing well.   He is now here for 4 week follow up to closely follow up on his left leg ischemic change noted on his last OV. He now has an ulcer similar to his right foot ulcer, being followed by Dr. Jacqualyn Posey.    Past Medical History:  Diagnosis Date  . Colon polyp   . Diabetes mellitus 1987   under care of Dr. Chalmers Cater.  On insulin since 96 (off and on)  . Diabetic retinopathy   . Dupuytren contracture    R hand, s/p injection (Dr. Lenon Curt)  . Essential hypertension, benign   . Essential hypertension, benign 02/06/2019  . Frequency of urination and polyuria   . Hypertension   . Myocardial infarction ([place])   . Neuromuscular disorder ([place])    Diabetic neuropathy  . Other testicular hypofunction   . Peripheral arterial disease ([place]) 10/28/2012  . Peritoneal abscess ([place]) 6/08   and buttock.  . Polydipsia   . Proteinuria   . Pure hyperglyceridemia     Past Surgical History:  Procedure Laterality Date  . ABDOMINAL AORTAGRAM N/A 04/18/2012   Procedure: ABDOMINAL Maxcine Ham;  Surgeon: Angelia Mould, MD;  Location: [facility] CATH LAB;  Service: Cardiovascular;  Laterality: N/A;  . CARDIAC CATHETERIZATION N/A 09/08/2016   Procedure: Left Heart Cath and Coronary Angiography;  Surgeon: Adrian Prows, MD;   Location: [place] CV LAB;  Service: Cardiovascular;  Laterality: N/A;  . CATARACT EXTRACTION, BILATERAL  09/2017, 10/2017   Dr. Herbert Deaner  . LOWER EXTREMITY ANGIOGRAM Bilateral 04/18/2012   Procedure: LOWER EXTREMITY ANGIOGRAM;  Surgeon: Angelia Mould, MD;  Location: [facility] CATH LAB;  Service: Cardiovascular;  Laterality: Bilateral;  bilat lower extrem angio  . LOWER EXTREMITY ANGIOGRAPHY Bilateral 02/28/2019   Procedure: LOWER EXTREMITY ANGIOGRAPHY;  Surgeon: Adrian Prows, MD;  Location: [place] [facility] LAB;  Service: Cardiovascular;  Laterality: Bilateral;  . macular photocoagulation     (eye treatments for diabetic retinopathy)-Dr. Zigmund Daniel  . PERIPHERAL VASCULAR INTERVENTION  02/28/2019   Procedure: PERIPHERAL VASCULAR INTERVENTION;  Surgeon: Adrian Prows, MD;  Location: [place] [facility] LAB;  Service: Cardiovascular;;    Social History   Socioeconomic History  . Marital status: Widowed    Spouse name: Not on file  . Number of children: 0  . Years of education: Not on file  . Highest education level: Not on file  Occupational History  . 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 Financial resource strain: Not on file  . Food insecurity:    Worry: Not on file    Inability: Not on file  . Transportation needs:    Medical: Not on file    Non-medical: Not on file  Tobacco  Use  . Smoking status: Former Smoker    Packs/day: 1.00    Years: 30.00    Pack years: 30.00    Types: Cigarettes    Last attempt to quit: 01/08/2012    Years since quitting: 7.2  . Smokeless tobacco: Never Used  Substance and Sexual Activity  . Alcohol use: No  . Drug use: No  . Sexual activity: Not Currently  Lifestyle  . Physical activity:    Days per week: Not on file    Minutes per session: Not on file  . Stress: Not on file  Relationships  . Social connections:    Talks on phone: Not on file    Gets together: Not on file    Attends  religious service: Not on file    Active member of club or organization: Not on file    Attends meetings of clubs or organizations: Not on file    Relationship status: Not on file  . Intimate partner violence:    Fear of current or ex partner: Not on file    Emotionally abused: Not on file    Physically abused: Not on file    Forced sexual activity: Not on file  Other Topics Concern  . Not on file  Social History Narrative   Widowed. Previously traveled 40 weeks out of the year. No longer travels as much. No pets    Current Outpatient Medications on File Prior to Visit  Medication Sig Dispense Refill  . amoxicillin-clavulanate (AUGMENTIN) 500-125 MG tablet Take 1 tablet by mouth 2 (two) times a day.    Marland Kitchen aspirin 81 MG tablet Take 1 tablet (81 mg total) by mouth daily. 30 tablet   . B-D UF III MINI PEN NEEDLES 31G X 5 MM MISC U UTD 6 TIMES A DAY  0  . Bioflavonoid Products (ESTER-C) 1000-50 MG TABS Take 1 tablet by mouth daily.     . Cholecalciferol (VITAMIN D) 2000 units CAPS Take 2,000 Units by mouth daily.    . ciprofloxacin (CIPRO) 500 MG tablet Take 1 tablet by mouth 2 (two) times a day.    . clindamycin (CLEOCIN) 300 MG capsule Take 1 capsule (300 mg total) by mouth 3 (three) times daily. 30 capsule 0  . clopidogrel (PLAVIX) 75 MG tablet Take 75 mg by mouth daily.    . furosemide (LASIX) 20 MG tablet Take 20 mg by mouth daily.   5  . gemfibrozil (LOPID) 600 MG tablet Take 600 mg by mouth 2 (two) times daily before a meal.      . HUMALOG KWIKPEN 100 UNIT/ML KiwkPen Inject 0-12 Units into the skin 2 (two) times daily. SLIDING SCALE DEPENDING ON BLOOD GLUCOSE BEFORE MEAL  1  . HUMULIN N KWIKPEN 100 UNIT/ML Kiwkpen Inject 30-40 Units into the skin See admin instructions. A.m 40 units, P.m. 30 units  1  . isosorbide mononitrate (IMDUR) 60 MG 24 hr tablet TAKE 1 TABLET BY MOUTH DAILY 90 tablet 2  . Krill Oil 350 MG CAPS Take 350 mg by mouth daily. Move Free Ultra Krill Oil    .  lisinopril (PRINIVIL,ZESTRIL) 10 MG tablet Take 10 mg by mouth daily.   3  . metoprolol succinate (TOPROL-XL) 100 MG 24 hr tablet Take 100 mg by mouth daily. Take with or immediately following a meal.    . Multiple Vitamin (MULTIVITAMIN) tablet Take 1 tablet by mouth daily.    . nitroGLYCERIN (NITROSTAT) 0.4 MG SL tablet Place 0.4 mg under the  tongue every 5 (five) minutes as needed for chest pain.  1  . Probiotic Product (PROBIOTIC-10 PO) Take 1 capsule by mouth daily.    . rosuvastatin (CRESTOR) 20 MG tablet Take 20 mg by mouth daily.  3  . silver sulfADIAZINE (SILVADENE) 1 % cream Apply 1 application topically daily. 50 g 0   No current facility-administered medications on file prior to visit.     Review of Systems  Constitution: Negative for chills, decreased appetite, malaise/fatigue and weight gain.  Cardiovascular: Positive for claudication (minimal. Ulcer rght and left foot) and dyspnea on exertion (stable). Negative for leg swelling and syncope.  Endocrine: Negative for cold intolerance.  Hematologic/Lymphatic: Does not bruise/bleed easily.  Musculoskeletal: Negative for joint swelling.  Gastrointestinal: Negative for abdominal pain, anorexia and change in bowel habit.  Neurological: Negative for headaches and light-headedness.  Psychiatric/Behavioral: Negative for depression and substance abuse.  All other systems reviewed and are negative.     Objective  Blood pressure 135/64, pulse 65, temperature (!) 97.3 F (36.3 C), height _0  (1.778 m), weight 283 lb (128.4 kg), SpO2 96 %. Body mass index is 40.61 kg/m.    Physical Exam  Constitutional: He appears well-developed. No distress.  Morbidly obese  HENT:  Head: Atraumatic.  Eyes: Conjunctivae are normal.  Neck: Neck supple. No thyromegaly present.  Short neck and difficult to evaluate JVP  Cardiovascular: Exam reveals no gallop.  No murmur heard. Pulses:      Carotid pulses are 2+ on the right side and 2+ on the  left side.      Dorsalis pedis pulses are 2+ on the right side and 1+ on the left side.       Posterior tibial pulses are 0 on the right side and 1+ on the left side.  Femoral and popliteal pulse difficult to feel due to patient's body habitus. Left groin has minimal ecchymosis.  Right foot ulcer lateral aspect of the sole is healing very well with granulation tissue without e.o infection.  Left foot small ulcer and dry with ischemic skin change, no granulation tissue  Pulmonary/Chest: Effort normal.  Abdominal: Soft.  Obese.   Musculoskeletal: Normal range of motion.        General: No edema.  Neurological: He is alert.  Skin: Skin is dry.  Psychiatric: He has a normal mood and affect.   Radiology: No results found.  Laboratory examination:  01/19/2018: Cholesterol 115, triglycerides 112, HDL 39, LDL 54.  Glucose 125, creatinine 1.16, potassium 5.2, EGFR 67, CMP otherwise normal.  CBC normal.  TSH 1.5.  05/24/2017: Creatinine 1.6, potassium 4.9, BMP otherwise normal.  Hemoglobin A1c 7.1%.  Liver enzymes normal.  Cholesterol 92, triglycerides 72, HDL 36, LDL 42.  CMP Latest Ref Rng & Units 02/23/2019 06/06/2018 09/08/2016  Glucose 65 - 99 mg/dL 85 171(H) 306(H)  BUN 8 - 27 mg/dL 32(H) 30(H) 34(H)  Creatinine 0.76 - 1.27 mg/dL 1.40(H) 1.27 1.34(H)  Sodium 134 - 144 mmol/L 142 142 138  Potassium 3.5 - 5.2 mmol/L 4.6 4.9 4.8  Chloride 96 - 106 mmol/L 103 102 110  CO2 20 - 29 mmol/L _1 Calcium 8.6 - 10.2 mg/dL 9.2 9.5 9.3  Total Protein 6.0 - 8.3 g/dL - - -  Total Bilirubin 0.3 - 1.2 mg/dL - - -  Alkaline Phos 39 - 117 U/L - - -  AST 0 - 37 U/L - - -  ALT 0 - 53 U/L - - -  CBC Latest Ref Rng & Units 02/23/2019 04/18/2012 09/28/2011  WBC 3.4 - 10.8 x10E3/uL 7.9 - 13.0(H)  Hemoglobin 13.0 - 17.7 g/dL 15.8 13.6 15.4  Hematocrit 37.5 - 51.0 % 45.9 40.0 46.0  Platelets 150 - 450 x10E3/uL 211 - 275   Lipid Panel     Component Value Date/Time   CHOL 92 05/24/2017   TRIG 72  05/24/2017   HDL 36 05/24/2017   CHOLHDL 4.4 09/28/2011 1358   VLDL 21 09/28/2011 1358   LDLCALC 42 05/24/2017   HEMOGLOBIN A1C Lab Results  Component Value Date   HGBA1C 7.2 06/09/2018   MPG 192 (H) 09/28/2011   TSH No results for input(s): TSH in the last 8760 hours.  Cardiac Studies:   Peripheral arteriogram 02/28/2019: Pelvic aortogram with limited bifemoral arteriogram revealed patent distal abdominal aorta without aneurysm, patent, bilateral iliac and common femoral vessels. Right SFA is occluded in the ostium and reconstitutes outside of the Hunter's canal.  Below the right knee there is two-vessel runoff, AT is occluded.  Left leg was not studied beyond left common femoral artery. Successful PTA and stenting of right SFA, prolonged procedure, difficult procedure with use of multiple balloons, guidewires and catheters.  100% stenosis reduced to 0% with implantation of 3 overlapping 6.0 x 120 x2; 6.0 x 100 mm Eluvia DES. 180 mL contrast utilized.  Coronary angiogram 09/08/2016: Low normal LVEF, 44-50% with apical akinesis. Occluded mid LAD, D2 mod sized 80% stenosis, OM1 bifurcates, occluded in the proximal segment. Large dominant RCA. 4 PL branches, large PL 1 with 80% stenosis, large PL 3 long segment occlusion. All CTO's have ipsilateral and contralateral faint collaterals. Calcification.  Echocardiogram 09/01/2016: Left ventricle cavity is normal in size. Moderate concentric hypertrophy of the left ventricle. Mild to moderately reduced left ventricular function. Left ventricle regional wall motion findings: Poor echo window and reduced sensitivity. There is inferior hypokinesis, lateral hypokinesis, mild global hypokinesis. Grade 1 diastolic dysfunction. Calculated EF 40%. Left atrial cavity is mildly dilated at 4.2 cm. IVC is dilated with poor inspiration collapse consistent with elevated right atrial pressure.  Exercise sestamibi stress test 08/17/2016: 1. Resting EKG  demonstrates normal sinus rhythm, left axis deviation, left plantar fascicular block. Poor R-wave progression, ulnar disease pattern. Nonspecific ST-T abnormality, cannot exclude lateral ischemia. Stress EKG is equivocal for ischemia, patient developing atypical left otherwise for with exercise which reverted back to baseline immediately on combination of the stress test less than 60 seconds. There was no additional ST-T wave changes of ischemia. Patient exercised on Bruce protocol for 4:25 minutes and achieved 5.45 METS. Stress test terminated due to 89 % MPHR achieved (Target HR >85%). Symptoms included dizziness.  2. 2-Day protocol followed. Perfusion imaging studies demonstrate large sized severe perfusion defect involving the inferior, anterior and anterolateral consistent with inferior wall scar with moderate peri-infarct ischemia and severe anterior and anteroapical reversible ischemia extending from the base towards the apex. Left ventricular systolic function calculating by QGS was markedly depressed at 26%. This is a high risk study, consider further cardiac work-up.  Carotid artery duplex 02/16/2018: No hemodynamically significant arterial disease in the internal carotid artery bilaterally. Mild heterogenous plaque noted.  Antegrade right vertebral artery flow. Antegrade left vertebral artery flow. Compared to the study done on 09/01/2016, left carotid noted as occlusion is an error. This may perhaps be due to low velocity noted in both carotid arteries and may indicate low systemic BP or reduced cardiac output. Clinical correlation recommended.  ABI 06/10/2016: R  ABI 0.72, L ABI 1.0  Peripheral arteriogram 04/18/2012:  Right SFA occluded from proximal to distal segment, reconstitutes at the level of the popliteal artery.  Two-vessel runoff in the form of peroneal and posterior tibial artery.  Left SFA mild disease, moderate disease in the peroneal and posterior tibial vessels,  three-vessel runoff.   Assessment   Peripheral artery disease (HCC)  Ischemic ulcer, limited to breakdown of skin ([place]) righ foot  DM (diabetes mellitus), type 2 with peripheral vascular complications (HCC)  3-vessel coronary artery disease  Essential hypertension, benign  EKG 08/01/2018: Sinus bradycardia at the rate of 53 bpm, left axis deviation, poor R-wave progression, anteroseptal infarct old.  Nonspecific T abnormality.  Low-voltage complexes. No significant change from EKG 01/19/2018, 07/19/2017.  Recommendations:   He is here in the office for follow-up of peripheral artery disease S/P right SFA stent on 02/28/19 with 6.0 x 120 x2; 6.0 x 100 mm Eluvia DES. Righ foot ulcer since has been healing.   Ulcer in the left foot is non-healing, but appears to be dry and stable. In view of  New left leg  Ulcer, will repeat peripheral angiography and possible angioplasty.   I have again discussed with him regarding weight loss.  From cardiac standpoint he has not had any angina pectoris, blood pressure is well controlled, he is presently tolerating dual antiplatelet therapy. Needs lipid profile.   Adrian Prows, MD, [facility] 04/15/2019, 5:46 PM [facility]. [place] Pager: [phone] Office: [phone] If no answer Cell [phone]

## 2019-04-14 NOTE — H&P (View-Only) (Signed)
 Primary Physician/Referring:  Knapp, Eve, MD  Patient ID: Eduardo [name], male    DOB: 11/11/1955, 62 y.o.   MRN: [id number]  Chief Complaint  Patient presents with  . PAD    4 week f/u, swelling from ulcers, test results    HPI: Eduardo [name]  is a 62 y.o. male  with controlled type 2 diabetes, hypertension, hyperlipidemia, coronary artery disease by angiography on 09/08/2016 revealing severe triple-vessel CAD with no targets for CABG, presently on aggressive medical therapy, mild carotid atherosclerosis, right SFA occlusion S/P complex PV angiogram on 02/28/2019 with PTA to occluded right SFA with implantation of 3 overlapping 6.0 x 120 x2; 6.0 x 100 mm Eluvia DES. Since then the ulcer is healing well.   He is now here for 4 week follow up to closely follow up on his left leg ischemic change noted on his last OV. He now has an ulcer similar to his right foot ulcer, being followed by Dr. Wagoner.    Past Medical History:  Diagnosis Date  . Colon polyp   . Diabetes mellitus 1987   under care of Dr. Balan.  On insulin since 96 (off and on)  . Diabetic retinopathy   . Dupuytren contracture    R hand, s/p injection (Dr. Coley)  . Essential hypertension, benign   . Essential hypertension, benign 02/06/2019  . Frequency of urination and polyuria   . Hypertension   . Myocardial infarction (HCC)   . Neuromuscular disorder (HCC)    Diabetic neuropathy  . Other testicular hypofunction   . Peripheral arterial disease (HCC) 10/28/2012  . Peritoneal abscess (HCC) 6/08   and buttock.  . Polydipsia   . Proteinuria   . Pure hyperglyceridemia     Past Surgical History:  Procedure Laterality Date  . ABDOMINAL AORTAGRAM N/A 04/18/2012   Procedure: ABDOMINAL AORTAGRAM;  Surgeon: Christopher S Dickson, MD;  Location: [facility]ATH [facility];  Service: Cardiovascular;  Laterality: N/A;  . CARDIAC CATHETERIZATION N/A 09/08/2016   Procedure: Left Heart Cath and Coronary Angiography;  Surgeon: Levie Wages, MD;   Location: [facility] INVASIVE [facility];  Service: Cardiovascular;  Laterality: N/A;  . CATARACT EXTRACTION, BILATERAL  09/2017, 10/2017   Dr. Hecker  . LOWER EXTREMITY ANGIOGRAM Bilateral 04/18/2012   Procedure: LOWER EXTREMITY ANGIOGRAM;  Surgeon: Christopher S Dickson, MD;  Location: [facility]ATH [facility];  Service: Cardiovascular;  Laterality: Bilateral;  bilat lower extrem angio  . LOWER EXTREMITY ANGIOGRAPHY Bilateral 02/28/2019   Procedure: LOWER EXTREMITY ANGIOGRAPHY;  Surgeon: Yolando Gillum, MD;  Location: [facility];  Service: Cardiovascular;  Laterality: Bilateral;  . macular photocoagulation     (eye treatments for diabetic retinopathy)-Dr. Matthews  . PERIPHERAL VASCULAR INTERVENTION  02/28/2019   Procedure: PERIPHERAL VASCULAR INTERVENTION;  Surgeon: Leslie Langille, MD;  Location: [facility];  Service: Cardiovascular;;    Social History   Socioeconomic History  . Marital status: Widowed    Spouse name: Not on file  . Number of children: 0  . Years of education: Not on file  . Highest education level: Not on file  Occupational History  . 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 Financial resource strain: Not on file  . Food insecurity:    Worry: Not on file    Inability: Not on file  . Transportation needs:    Medical: Not on file    Non-medical: Not on file  Tobacco   Use  . Smoking status: Former Smoker    Packs/day: 1.00    Years: 30.00    Pack years: 30.00    Types: Cigarettes    Last attempt to quit: 01/08/2012    Years since quitting: 7.2  . Smokeless tobacco: Never Used  Substance and Sexual Activity  . Alcohol use: No  . Drug use: No  . Sexual activity: Not Currently  Lifestyle  . Physical activity:    Days per week: Not on file    Minutes per session: Not on file  . Stress: Not on file  Relationships  . Social connections:    Talks on phone: Not on file    Gets together: Not on file    Attends  religious service: Not on file    Active member of club or organization: Not on file    Attends meetings of clubs or organizations: Not on file    Relationship status: Not on file  . Intimate partner violence:    Fear of current or ex partner: Not on file    Emotionally abused: Not on file    Physically abused: Not on file    Forced sexual activity: Not on file  Other Topics Concern  . Not on file  Social History Narrative   Widowed. Previously traveled 40 weeks out of the year. No longer travels as much. No pets    Current Outpatient Medications on File Prior to Visit  Medication Sig Dispense Refill  . amoxicillin-clavulanate (AUGMENTIN) 500-125 MG tablet Take 1 tablet by mouth 2 (two) times a day.    . aspirin 81 MG tablet Take 1 tablet (81 mg total) by mouth daily. 30 tablet   . B-D UF III MINI PEN NEEDLES 31G X 5 MM MISC U UTD 6 TIMES A DAY  0  . Bioflavonoid Products (ESTER-C) 1000-50 MG TABS Take 1 tablet by mouth daily.     . Cholecalciferol (VITAMIN D) 2000 units CAPS Take 2,000 Units by mouth daily.    . ciprofloxacin (CIPRO) 500 MG tablet Take 1 tablet by mouth 2 (two) times a day.    . clindamycin (CLEOCIN) 300 MG capsule Take 1 capsule (300 mg total) by mouth 3 (three) times daily. 30 capsule 0  . clopidogrel (PLAVIX) 75 MG tablet Take 75 mg by mouth daily.    . furosemide (LASIX) 20 MG tablet Take 20 mg by mouth daily.   5  . gemfibrozil (LOPID) 600 MG tablet Take 600 mg by mouth 2 (two) times daily before a meal.      . HUMALOG KWIKPEN 100 UNIT/ML KiwkPen Inject 0-12 Units into the skin 2 (two) times daily. SLIDING SCALE DEPENDING ON BLOOD GLUCOSE BEFORE MEAL  1  . HUMULIN N KWIKPEN 100 UNIT/ML Kiwkpen Inject 30-40 Units into the skin See admin instructions. A.m 40 units, P.m. 30 units  1  . isosorbide mononitrate (IMDUR) 60 MG 24 hr tablet TAKE 1 TABLET BY MOUTH DAILY 90 tablet 2  . Krill Oil 350 MG CAPS Take 350 mg by mouth daily. Move Free Ultra Krill Oil    .  lisinopril (PRINIVIL,ZESTRIL) 10 MG tablet Take 10 mg by mouth daily.   3  . metoprolol succinate (TOPROL-XL) 100 MG 24 hr tablet Take 100 mg by mouth daily. Take with or immediately following a meal.    . Multiple Vitamin (MULTIVITAMIN) tablet Take 1 tablet by mouth daily.    . nitroGLYCERIN (NITROSTAT) 0.4 MG SL tablet Place 0.4 mg under the   tongue every 5 (five) minutes as needed for chest pain.  1  . Probiotic Product (PROBIOTIC-10 PO) Take 1 capsule by mouth daily.    . rosuvastatin (CRESTOR) 20 MG tablet Take 20 mg by mouth daily.  3  . silver sulfADIAZINE (SILVADENE) 1 % cream Apply 1 application topically daily. 50 g 0   No current facility-administered medications on file prior to visit.     Review of Systems  Constitution: Negative for chills, decreased appetite, malaise/fatigue and weight gain.  Cardiovascular: Positive for claudication (minimal. Ulcer rght and left foot) and dyspnea on exertion (stable). Negative for leg swelling and syncope.  Endocrine: Negative for cold intolerance.  Hematologic/Lymphatic: Does not bruise/bleed easily.  Musculoskeletal: Negative for joint swelling.  Gastrointestinal: Negative for abdominal pain, anorexia and change in bowel habit.  Neurological: Negative for headaches and light-headedness.  Psychiatric/Behavioral: Negative for depression and substance abuse.  All other systems reviewed and are negative.     Objective  Blood pressure 135/64, pulse 65, temperature (!) 97.3 F (36.3 C), height _0  (1.778 m), weight 283 lb (128.4 kg), SpO2 96 %. Body mass index is 40.61 kg/m.    Physical Exam  Constitutional: He appears well-developed. No distress.  Morbidly obese  HENT:  Head: Atraumatic.  Eyes: Conjunctivae are normal.  Neck: Neck supple. No thyromegaly present.  Short neck and difficult to evaluate JVP  Cardiovascular: Exam reveals no gallop.  No murmur heard. Pulses:      Carotid pulses are 2+ on the right side and 2+ on the  left side.      Dorsalis pedis pulses are 2+ on the right side and 1+ on the left side.       Posterior tibial pulses are 0 on the right side and 1+ on the left side.  Femoral and popliteal pulse difficult to feel due to patient's body habitus. Left groin has minimal ecchymosis.  Right foot ulcer lateral aspect of the sole is healing very well with granulation tissue without e.o infection.  Left foot small ulcer and dry with ischemic skin change, no granulation tissue  Pulmonary/Chest: Effort normal.  Abdominal: Soft.  Obese.   Musculoskeletal: Normal range of motion.        General: No edema.  Neurological: He is alert.  Skin: Skin is dry.  Psychiatric: He has a normal mood and affect.   Radiology: No results found.  Laboratory examination:  01/19/2018: Cholesterol 115, triglycerides 112, HDL 39, LDL 54.  Glucose 125, creatinine 1.16, potassium 5.2, EGFR 67, CMP otherwise normal.  CBC normal.  TSH 1.5.  05/24/2017: Creatinine 1.6, potassium 4.9, BMP otherwise normal.  Hemoglobin A1c 7.1%.  Liver enzymes normal.  Cholesterol 92, triglycerides 72, HDL 36, LDL 42.  CMP Latest Ref Rng & Units 02/23/2019 06/06/2018 09/08/2016  Glucose 65 - 99 mg/dL 85 171(H) 306(H)  BUN 8 - 27 mg/dL 32(H) 30(H) 34(H)  Creatinine 0.76 - 1.27 mg/dL 1.40(H) 1.27 1.34(H)  Sodium 134 - 144 mmol/L 142 142 138  Potassium 3.5 - 5.2 mmol/L 4.6 4.9 4.8  Chloride 96 - 106 mmol/L 103 102 110  CO2 20 - 29 mmol/L _1 Calcium 8.6 - 10.2 mg/dL 9.2 9.5 9.3  Total Protein 6.0 - 8.3 g/dL - - -  Total Bilirubin 0.3 - 1.2 mg/dL - - -  Alkaline Phos 39 - 117 U/L - - -  AST 0 - 37 U/L - - -  ALT 0 - 53 U/L - - -  CBC Latest Ref Rng & Units 02/23/2019 04/18/2012 09/28/2011  WBC 3.4 - 10.8 x10E3/uL 7.9 - 13.0(H)  Hemoglobin 13.0 - 17.7 g/dL 15.8 13.6 15.4  Hematocrit 37.5 - 51.0 % 45.9 40.0 46.0  Platelets 150 - 450 x10E3/uL 211 - 275   Lipid Panel     Component Value Date/Time   CHOL 92 05/24/2017   TRIG 72  05/24/2017   HDL 36 05/24/2017   CHOLHDL 4.4 09/28/2011 1358   VLDL 21 09/28/2011 1358   LDLCALC 42 05/24/2017   HEMOGLOBIN A1C Lab Results  Component Value Date   HGBA1C 7.2 06/09/2018   MPG 192 (H) 09/28/2011   TSH No results for input(s): TSH in the last 8760 hours.  Cardiac Studies:   Peripheral arteriogram 02/28/2019: Pelvic aortogram with limited bifemoral arteriogram revealed patent distal abdominal aorta without aneurysm, patent, bilateral iliac and common femoral vessels. Right SFA is occluded in the ostium and reconstitutes outside of the Hunter's canal.  Below the right knee there is two-vessel runoff, AT is occluded.  Left leg was not studied beyond left common femoral artery. Successful PTA and stenting of right SFA, prolonged procedure, difficult procedure with use of multiple balloons, guidewires and catheters.  100% stenosis reduced to 0% with implantation of 3 overlapping 6.0 x 120 x2; 6.0 x 100 mm Eluvia DES. 180 mL contrast utilized.  Coronary angiogram 09/08/2016: Low normal LVEF, 44-50% with apical akinesis. Occluded mid LAD, D2 mod sized 80% stenosis, OM1 bifurcates, occluded in the proximal segment. Large dominant RCA. 4 PL branches, large PL 1 with 80% stenosis, large PL 3 long segment occlusion. All CTO's have ipsilateral and contralateral faint collaterals. Calcification.  Echocardiogram 09/01/2016: Left ventricle cavity is normal in size. Moderate concentric hypertrophy of the left ventricle. Mild to moderately reduced left ventricular function. Left ventricle regional wall motion findings: Poor echo window and reduced sensitivity. There is inferior hypokinesis, lateral hypokinesis, mild global hypokinesis. Grade 1 diastolic dysfunction. Calculated EF 40%. Left atrial cavity is mildly dilated at 4.2 cm. IVC is dilated with poor inspiration collapse consistent with elevated right atrial pressure.  Exercise sestamibi stress test 08/17/2016: 1. Resting EKG  demonstrates normal sinus rhythm, left axis deviation, left plantar fascicular block. Poor R-wave progression, ulnar disease pattern. Nonspecific ST-T abnormality, cannot exclude lateral ischemia. Stress EKG is equivocal for ischemia, patient developing atypical left otherwise for with exercise which reverted back to baseline immediately on combination of the stress test less than 60 seconds. There was no additional ST-T wave changes of ischemia. Patient exercised on Bruce protocol for 4:25 minutes and achieved 5.45 METS. Stress test terminated due to 89 % MPHR achieved (Target HR >85%). Symptoms included dizziness.  2. 2-Day protocol followed. Perfusion imaging studies demonstrate large sized severe perfusion defect involving the inferior, anterior and anterolateral consistent with inferior wall scar with moderate peri-infarct ischemia and severe anterior and anteroapical reversible ischemia extending from the base towards the apex. Left ventricular systolic function calculating by QGS was markedly depressed at 26%. This is a high risk study, consider further cardiac work-up.  Carotid artery duplex 02/16/2018: No hemodynamically significant arterial disease in the internal carotid artery bilaterally. Mild heterogenous plaque noted.  Antegrade right vertebral artery flow. Antegrade left vertebral artery flow. Compared to the study done on 09/01/2016, left carotid noted as occlusion is an error. This may perhaps be due to low velocity noted in both carotid arteries and may indicate low systemic BP or reduced cardiac output. Clinical correlation recommended.  ABI 06/10/2016: R  ABI 0.72, L ABI 1.0  Peripheral arteriogram 04/18/2012:  Right SFA occluded from proximal to distal segment, reconstitutes at the level of the popliteal artery.  Two-vessel runoff in the form of peroneal and posterior tibial artery.  Left SFA mild disease, moderate disease in the peroneal and posterior tibial vessels,  three-vessel runoff.   Assessment   Peripheral artery disease (HCC)  Ischemic ulcer, limited to breakdown of skin ([place]) righ foot  DM (diabetes mellitus), type 2 with peripheral vascular complications (HCC)  3-vessel coronary artery disease  Essential hypertension, benign  EKG 08/01/2018: Sinus bradycardia at the rate of 53 bpm, left axis deviation, poor R-wave progression, anteroseptal infarct old.  Nonspecific T abnormality.  Low-voltage complexes. No significant change from EKG 01/19/2018, 07/19/2017.  Recommendations:   He is here in the office for follow-up of peripheral artery disease S/P right SFA stent on 02/28/19 with 6.0 x 120 x2; 6.0 x 100 mm Eluvia DES. Righ foot ulcer since has been healing.   Ulcer in the left foot is non-healing, but appears to be dry and stable. In view of  New left leg  Ulcer, will repeat peripheral angiography and possible angioplasty.   I have again discussed with him regarding weight loss.  From cardiac standpoint he has not had any angina pectoris, blood pressure is well controlled, he is presently tolerating dual antiplatelet therapy. Needs lipid profile.   Adrian Prows, MD, [facility] 04/15/2019, 5:46 PM [facility]. [place] Pager: [phone] Office: [phone] If no answer Cell [phone]

## 2019-04-15 ENCOUNTER — Encounter: Payer: Self-pay | Admitting: Cardiology

## 2019-04-17 ENCOUNTER — Ambulatory Visit (INDEPENDENT_AMBULATORY_CARE_PROVIDER_SITE_OTHER): Payer: BC Managed Care – PPO | Admitting: Podiatry

## 2019-04-17 ENCOUNTER — Ambulatory Visit (INDEPENDENT_AMBULATORY_CARE_PROVIDER_SITE_OTHER): Payer: BC Managed Care – PPO

## 2019-04-17 ENCOUNTER — Encounter: Payer: Self-pay | Admitting: Family Medicine

## 2019-04-17 ENCOUNTER — Other Ambulatory Visit: Payer: Self-pay

## 2019-04-17 ENCOUNTER — Encounter: Payer: Self-pay | Admitting: Podiatry

## 2019-04-17 VITALS — Temp 97.5°F

## 2019-04-17 DIAGNOSIS — L97519 Non-pressure chronic ulcer of other part of right foot with unspecified severity: Secondary | ICD-10-CM | POA: Diagnosis not present

## 2019-04-17 DIAGNOSIS — E1140 Type 2 diabetes mellitus with diabetic neuropathy, unspecified: Secondary | ICD-10-CM | POA: Diagnosis not present

## 2019-04-17 DIAGNOSIS — L97529 Non-pressure chronic ulcer of other part of left foot with unspecified severity: Secondary | ICD-10-CM

## 2019-04-17 MED ORDER — AMOXICILLIN-POT CLAVULANATE 500-125 MG PO TABS: 1.0000 | ORAL_TABLET | Freq: Two times a day (BID) | ORAL | 0 refills | Status: DC

## 2019-04-17 MED ORDER — TRAMADOL HCL 50 MG PO TABS: 50.0000 mg | ORAL_TABLET | Freq: Two times a day (BID) | ORAL | 0 refills | Status: AC | PRN

## 2019-04-17 NOTE — Progress Notes (Signed)
Subjective: 63 year old male presents the office today for follow-up evaluation of a wound on the right foot and for a wound on the left foot.  He states he has had some discomfort at times on Saturday but since then he has not had any further pain.  He did call the on-call physician 2 weekends ago because he had some redness.  He still on both the Augmentin and Cipro and finished.  He denies any fevers, chills, nausea, vomiting.  No calf pain, chest pain, shortness of breath.  His last A1c was 8.2 in January 2020.   Scheduled for an angio on the left side later this month with Dr. Nadyne Coombes.   Objective: AAO x3, NAD Neurovascular unchanged Ulceration on the right foot fifth metatarsal head which is superficial with a granular base.  There is no surrounding erythema, ascending cellulitis there is no fluctuation crepitation any malodor.  Minimal hyperkeratotic tissue.  Has been wearing a surgical shoe with the offloading.  The wound measures about the same size of 2.1 x 1.2 cm x 0.1.  There is no probing, undermining or tunneling.  The wound is always been superficial.  Very faint surrounding erythema but no ascending cellulitis.  There is no increase in warmth of the foot. On the left foot submetatarsal 5 is a hyperkeratotic lesion.  Wound measures 0.5 x 0.5 cm and again there is no probing, undermining or tunneling.  Very mild surrounding erythema but there is no ascending cellulitis.  No fluctuation crepitation malodor.  Assessment: Right foot ulcer with resolving infection; PAD.  Left foot wound  Plan: -All treatment options discussed with the patient including all alternatives, risks, complications.  -X-rays obtained reviewed. No cortical changes consistent with osteomyelitis. There is some swelling on the 5th metatarsal head on the right but no obvious destruction. Will continue Augmentin for now.  -I lightly debrided the wound on the right side to some of the mild hyperkeratotic periwound.  I  previously ordered endoform dressing however he states this caused some irritation of the skin so you about using Silvadene.  Would continue that for now. -Continue offloading at all times. -I will refer him to the wound care center.  The wound has been stable but still the same in size.  Trula Slade DPM

## 2019-04-18 ENCOUNTER — Telehealth: Payer: Self-pay | Admitting: *Deleted

## 2019-04-18 DIAGNOSIS — L97519 Non-pressure chronic ulcer of other part of right foot with unspecified severity: Secondary | ICD-10-CM

## 2019-04-18 DIAGNOSIS — D689 Coagulation defect, unspecified: Secondary | ICD-10-CM

## 2019-04-18 DIAGNOSIS — L97529 Non-pressure chronic ulcer of other part of left foot with unspecified severity: Secondary | ICD-10-CM

## 2019-04-18 DIAGNOSIS — E1140 Type 2 diabetes mellitus with diabetic neuropathy, unspecified: Secondary | ICD-10-CM

## 2019-04-18 NOTE — Telephone Encounter (Signed)
Faxed required form, clinicals and demographics to [facility]. 

## 2019-04-18 NOTE — Telephone Encounter (Signed)
-----   Message from Trula Slade, DPM sent at 04/17/2019  1:46 PM EDT ----- Can you please put in a referral to the wound care center? Thanks.

## 2019-04-21 DIAGNOSIS — I10 Essential (primary) hypertension: Secondary | ICD-10-CM | POA: Diagnosis not present

## 2019-04-21 DIAGNOSIS — L98491 Non-pressure chronic ulcer of skin of other sites limited to breakdown of skin: Secondary | ICD-10-CM | POA: Diagnosis not present

## 2019-04-21 DIAGNOSIS — E7800 Pure hypercholesterolemia, unspecified: Secondary | ICD-10-CM | POA: Diagnosis not present

## 2019-04-23 LAB — CMP14+EGFR
ALT: 20 IU/L (ref 0–44)
AST: 21 IU/L (ref 0–40)
Albumin/Globulin Ratio: 1.9 (ref 1.2–2.2)
Albumin: 3.8 g/dL (ref 3.8–4.8)
Alkaline Phosphatase: 68 IU/L (ref 39–117)
BUN/Creatinine Ratio: 24 (ref 10–24)
BUN: 31 mg/dL — ABNORMAL HIGH (ref 8–27)
Bilirubin Total: 0.4 mg/dL (ref 0.0–1.2)
CO2: 22 mmol/L (ref 20–29)
Calcium: 8.6 mg/dL (ref 8.6–10.2)
Chloride: 104 mmol/L (ref 96–106)
Creatinine, Ser: 1.31 mg/dL — ABNORMAL HIGH (ref 0.76–1.27)
GFR calc Af Amer: 67 mL/min/{1.73_m2} (ref >59)
GFR calc non Af Amer: 58 mL/min/{1.73_m2} — ABNORMAL LOW (ref >59)
Globulin, Total: 2 g/dL (ref 1.5–4.5)
Glucose: 140 mg/dL — ABNORMAL HIGH (ref 65–99)
Potassium: 4.9 mmol/L (ref 3.5–5.2)
Sodium: 139 mmol/L (ref 134–144)
Total Protein: 5.8 g/dL — ABNORMAL LOW (ref 6.0–8.5)

## 2019-04-23 LAB — CBC
Hematocrit: 44.9 % (ref 37.5–51.0)
Hemoglobin: 15.3 g/dL (ref 13.0–17.7)
MCH: 31.5 pg (ref 26.6–33.0)
MCHC: 34.1 g/dL (ref 31.5–35.7)
MCV: 93 fL (ref 79–97)
Platelets: 198 10*3/uL (ref 150–450)
RBC: 4.85 x10E6/uL (ref 4.14–5.80)
RDW: 12.7 % (ref 11.6–15.4)
WBC: 7.7 10*3/uL (ref 3.4–10.8)

## 2019-04-23 LAB — LIPID PANEL WITH LDL/HDL RATIO
Cholesterol, Total: 108 mg/dL (ref 100–199)
HDL: 33 mg/dL — ABNORMAL LOW (ref >39)
LDL Calculated: 55 mg/dL (ref 0–99)
LDl/HDL Ratio: 1.7 ratio (ref 0.0–3.6)
Triglycerides: 102 mg/dL (ref 0–149)
VLDL Cholesterol Cal: 20 mg/dL (ref 5–40)

## 2019-04-23 LAB — LIPOPROTEIN A (LPA): Lipoprotein (a): 13 nmol/L (ref <75.0)

## 2019-04-24 ENCOUNTER — Telehealth: Payer: Self-pay | Admitting: *Deleted

## 2019-04-24 ENCOUNTER — Other Ambulatory Visit: Payer: Self-pay

## 2019-04-24 ENCOUNTER — Ambulatory Visit (INDEPENDENT_AMBULATORY_CARE_PROVIDER_SITE_OTHER): Payer: BC Managed Care – PPO | Admitting: Podiatry

## 2019-04-24 ENCOUNTER — Encounter: Payer: Self-pay | Admitting: Podiatry

## 2019-04-24 VITALS — Temp 97.0°F

## 2019-04-24 DIAGNOSIS — L97529 Non-pressure chronic ulcer of other part of left foot with unspecified severity: Secondary | ICD-10-CM

## 2019-04-24 DIAGNOSIS — L97519 Non-pressure chronic ulcer of other part of right foot with unspecified severity: Secondary | ICD-10-CM | POA: Diagnosis not present

## 2019-04-24 DIAGNOSIS — E1140 Type 2 diabetes mellitus with diabetic neuropathy, unspecified: Secondary | ICD-10-CM

## 2019-04-24 MED ORDER — SILVER SULFADIAZINE 1 % EX CREA: 1.0000 "application " | TOPICAL_CREAM | Freq: Every day | CUTANEOUS | 0 refills | Status: DC

## 2019-04-24 NOTE — Telephone Encounter (Signed)
Dr. Jacqualyn Posey states pt has not received a call to schedule from the Wound care center. I refaxed because I spoke to Eduardo [name] and she states she did not see it in the to be scheduled area. I asked her to please schedule as urgent.

## 2019-04-25 NOTE — Progress Notes (Signed)
Subjective: 63 year old male presents the office today for follow-up evaluation of a wound on the right foot and for a wound on the left foot.  Overall he thinks that the wounds are doing better.  He is putting Silvadene on the wounds daily.  He is asking for refill today.  He has not seen any significant drainage.  No pus.  No pain.  Denies any new sores.  He is scheduled for angiogram on the left side next week.  Not received a call from the wound care center. He denies any fevers, chills, nausea, vomiting.  No calf pain, chest pain, shortness of breath.  His last A1c was 8.2 in January 2020.   Scheduled for an angio on the left side later this month with Dr. Nadyne Coombes.   Objective: AAO x3, NAD Neurovascular unchanged Ulceration on the right foot fifth metatarsal head which is superficial with a granular base.  There is no surrounding erythema, ascending cellulitis there is no fluctuation crepitation any malodor.  Minimal hyperkeratotic tissue.  Has been wearing a surgical shoe with the offloading.  The wound measures about the same size of 2.1 x 1.2 cm x 0.1.  Overall the wound is about the same size however there is an area along the periphery of starting to have some epithelial tissue.  I am not able to identify any signs of abscess.  There is no drainage or pus identified.   On the left foot on the lateral aspect of the fifth metatarsal head is a hyperkeratotic lesion.  Wound measures 0.5 x 0.4 cm and again there is no probing, undermining or tunneling.  Mild callus submetatarsal 5 in this area appears to be doing better. No surrounding erythema but there is no ascending cellulitis.  No fluctuation crepitation malodor. No pain.   Assessment: Right foot ulcer with resolving infection; PAD.  Left foot wound  Plan: -All treatment options discussed with the patient including all alternatives, risks, complications.  -Continue Silvadene dressing changes daily.  Offloading at all times.  Finish course  of antibiotics.  Follow-up with wound care center in regards to an appointment with them. -Monitor for any clinical signs or symptoms of infection and directed to call the office immediately should any occur or go to the ER.  Return in about 10 days (around 05/04/2019).  Trula Slade DPM

## 2019-04-28 ENCOUNTER — Encounter (HOSPITAL_BASED_OUTPATIENT_CLINIC_OR_DEPARTMENT_OTHER): Payer: BC Managed Care – PPO | Attending: Internal Medicine

## 2019-04-28 ENCOUNTER — Other Ambulatory Visit (HOSPITAL_COMMUNITY)
Admission: RE | Admit: 2019-04-28 | Discharge: 2019-04-28 | Disposition: A | Discharge status: Home / Self Care | Payer: BC Managed Care – PPO | Source: Ambulatory Visit | Attending: Cardiology | Admitting: Cardiology

## 2019-04-28 ENCOUNTER — Other Ambulatory Visit: Payer: Self-pay

## 2019-04-28 ENCOUNTER — Other Ambulatory Visit (HOSPITAL_COMMUNITY)
Admission: RE | Admit: 2019-04-28 | Discharge: 2019-04-28 | Disposition: A | Discharge status: Home / Self Care | Payer: BC Managed Care – PPO | Attending: Internal Medicine | Admitting: Internal Medicine

## 2019-04-28 DIAGNOSIS — E11621 Type 2 diabetes mellitus with foot ulcer: Secondary | ICD-10-CM | POA: Diagnosis not present

## 2019-04-28 DIAGNOSIS — I2510 Atherosclerotic heart disease of native coronary artery without angina pectoris: Secondary | ICD-10-CM | POA: Insufficient documentation

## 2019-04-28 DIAGNOSIS — Z794 Long term (current) use of insulin: Secondary | ICD-10-CM | POA: Insufficient documentation

## 2019-04-28 DIAGNOSIS — E1142 Type 2 diabetes mellitus with diabetic polyneuropathy: Secondary | ICD-10-CM | POA: Insufficient documentation

## 2019-04-28 DIAGNOSIS — E1151 Type 2 diabetes mellitus with diabetic peripheral angiopathy without gangrene: Secondary | ICD-10-CM | POA: Insufficient documentation

## 2019-04-28 DIAGNOSIS — Z1159 Encounter for screening for other viral diseases: Secondary | ICD-10-CM | POA: Insufficient documentation

## 2019-04-28 DIAGNOSIS — I10 Essential (primary) hypertension: Secondary | ICD-10-CM | POA: Insufficient documentation

## 2019-04-28 DIAGNOSIS — I739 Peripheral vascular disease, unspecified: Secondary | ICD-10-CM | POA: Diagnosis not present

## 2019-04-28 DIAGNOSIS — L97522 Non-pressure chronic ulcer of other part of left foot with fat layer exposed: Secondary | ICD-10-CM | POA: Insufficient documentation

## 2019-04-28 DIAGNOSIS — L97516 Non-pressure chronic ulcer of other part of right foot with bone involvement without evidence of necrosis: Secondary | ICD-10-CM | POA: Diagnosis not present

## 2019-04-28 DIAGNOSIS — L03119 Cellulitis of unspecified part of limb: Secondary | ICD-10-CM | POA: Insufficient documentation

## 2019-04-28 DIAGNOSIS — L97512 Non-pressure chronic ulcer of other part of right foot with fat layer exposed: Secondary | ICD-10-CM | POA: Diagnosis not present

## 2019-04-28 LAB — SARS CORONAVIRUS 2 (TAT 6-24 HRS): SARS Coronavirus 2: NEGATIVE

## 2019-05-01 ENCOUNTER — Other Ambulatory Visit (HOSPITAL_COMMUNITY): Payer: Self-pay | Admitting: Internal Medicine

## 2019-05-01 DIAGNOSIS — E08621 Diabetes mellitus due to underlying condition with foot ulcer: Secondary | ICD-10-CM

## 2019-05-01 DIAGNOSIS — L97519 Non-pressure chronic ulcer of other part of right foot with unspecified severity: Secondary | ICD-10-CM

## 2019-05-02 ENCOUNTER — Ambulatory Visit (HOSPITAL_COMMUNITY)
Admission: RE | Admit: 2019-05-02 | Discharge: 2019-05-02 | Disposition: A | Discharge status: Home / Self Care | Payer: BC Managed Care – PPO | Attending: Cardiology | Admitting: Cardiology

## 2019-05-02 ENCOUNTER — Encounter (HOSPITAL_COMMUNITY)
Admission: RE | Disposition: A | Discharge status: Home / Self Care | Payer: Self-pay | Source: Home / Self Care | Attending: Cardiology

## 2019-05-02 ENCOUNTER — Other Ambulatory Visit: Payer: Self-pay

## 2019-05-02 ENCOUNTER — Encounter (HOSPITAL_COMMUNITY): Payer: Self-pay | Admitting: Cardiology

## 2019-05-02 DIAGNOSIS — L97521 Non-pressure chronic ulcer of other part of left foot limited to breakdown of skin: Secondary | ICD-10-CM | POA: Insufficient documentation

## 2019-05-02 DIAGNOSIS — E785 Hyperlipidemia, unspecified: Secondary | ICD-10-CM | POA: Insufficient documentation

## 2019-05-02 DIAGNOSIS — E1151 Type 2 diabetes mellitus with diabetic peripheral angiopathy without gangrene: Secondary | ICD-10-CM | POA: Insufficient documentation

## 2019-05-02 DIAGNOSIS — Z79899 Other long term (current) drug therapy: Secondary | ICD-10-CM | POA: Diagnosis not present

## 2019-05-02 DIAGNOSIS — Z6841 Body Mass Index (BMI) 40.0 and over, adult: Secondary | ICD-10-CM | POA: Insufficient documentation

## 2019-05-02 DIAGNOSIS — L97209 Non-pressure chronic ulcer of unspecified calf with unspecified severity: Secondary | ICD-10-CM | POA: Diagnosis present

## 2019-05-02 DIAGNOSIS — I70248 Atherosclerosis of native arteries of left leg with ulceration of other part of lower left leg: Secondary | ICD-10-CM

## 2019-05-02 DIAGNOSIS — E1165 Type 2 diabetes mellitus with hyperglycemia: Secondary | ICD-10-CM | POA: Diagnosis not present

## 2019-05-02 DIAGNOSIS — I2510 Atherosclerotic heart disease of native coronary artery without angina pectoris: Secondary | ICD-10-CM | POA: Insufficient documentation

## 2019-05-02 DIAGNOSIS — E1140 Type 2 diabetes mellitus with diabetic neuropathy, unspecified: Secondary | ICD-10-CM | POA: Diagnosis not present

## 2019-05-02 DIAGNOSIS — Z7982 Long term (current) use of aspirin: Secondary | ICD-10-CM | POA: Diagnosis not present

## 2019-05-02 DIAGNOSIS — E11621 Type 2 diabetes mellitus with foot ulcer: Secondary | ICD-10-CM | POA: Diagnosis not present

## 2019-05-02 DIAGNOSIS — I252 Old myocardial infarction: Secondary | ICD-10-CM | POA: Insufficient documentation

## 2019-05-02 DIAGNOSIS — I10 Essential (primary) hypertension: Secondary | ICD-10-CM | POA: Insufficient documentation

## 2019-05-02 DIAGNOSIS — Z87891 Personal history of nicotine dependence: Secondary | ICD-10-CM | POA: Insufficient documentation

## 2019-05-02 DIAGNOSIS — I70245 Atherosclerosis of native arteries of left leg with ulceration of other part of foot: Secondary | ICD-10-CM | POA: Insufficient documentation

## 2019-05-02 DIAGNOSIS — Z794 Long term (current) use of insulin: Secondary | ICD-10-CM | POA: Insufficient documentation

## 2019-05-02 DIAGNOSIS — I7025 Atherosclerosis of native arteries of other extremities with ulceration: Secondary | ICD-10-CM | POA: Diagnosis present

## 2019-05-02 HISTORY — PX: LOWER EXTREMITY ANGIOGRAPHY: CATH118251

## 2019-05-02 LAB — AEROBIC CULTURE W GRAM STAIN (SUPERFICIAL SPECIMEN): Gram Stain: NONE SEEN

## 2019-05-02 LAB — GLUCOSE, CAPILLARY
Glucose-Capillary: 100 mg/dL — ABNORMAL HIGH (ref 70–99)
Glucose-Capillary: 76 mg/dL (ref 70–99)

## 2019-05-02 LAB — AEROBIC CULTURE? (SUPERFICIAL SPECIMEN)

## 2019-05-02 SURGERY — LOWER EXTREMITY ANGIOGRAPHY
Anesthesia: LOCAL | Laterality: Bilateral

## 2019-05-02 MED ORDER — SODIUM CHLORIDE 0.9 % IV SOLN
INTRAVENOUS | Status: DC
  Administered 2019-05-02: 10:00:00 via INTRAVENOUS

## 2019-05-02 MED ORDER — SODIUM CHLORIDE 0.9% FLUSH: 3.0000 mL | Freq: Two times a day (BID) | INTRAVENOUS | Status: DC

## 2019-05-02 MED ORDER — SODIUM CHLORIDE 0.9% FLUSH: 3.0000 mL | INTRAVENOUS | Status: DC | PRN

## 2019-05-02 MED ORDER — IODIXANOL 320 MG/ML IV SOLN
INTRAVENOUS | Status: DC | PRN
  Administered 2019-05-02: 09:00:00 45 mL via INTRA_ARTERIAL

## 2019-05-02 MED ORDER — ONDANSETRON HCL 4 MG/2ML IJ SOLN: 4.0000 mg | Freq: Four times a day (QID) | INTRAMUSCULAR | Status: DC | PRN

## 2019-05-02 MED ORDER — MIDAZOLAM HCL 2 MG/2ML IJ SOLN
INTRAMUSCULAR | Status: AC
  Filled 2019-05-02: qty 2

## 2019-05-02 MED ORDER — SODIUM CHLORIDE 0.9 % WEIGHT BASED INFUSION: 1.0000 mL/kg/h | INTRAVENOUS | Status: DC

## 2019-05-02 MED ORDER — ACETAMINOPHEN 325 MG PO TABS: 650.0000 mg | ORAL_TABLET | ORAL | Status: DC | PRN

## 2019-05-02 MED ORDER — FENTANYL CITRATE (PF) 100 MCG/2ML IJ SOLN
INTRAMUSCULAR | Status: AC
  Filled 2019-05-02: qty 2

## 2019-05-02 MED ORDER — LIDOCAINE HCL (PF) 1 % IJ SOLN
INTRAMUSCULAR | Status: DC | PRN
  Administered 2019-05-02: 20 mL via INTRADERMAL

## 2019-05-02 MED ORDER — SODIUM CHLORIDE 0.9 % IV SOLN: 250.0000 mL | INTRAVENOUS | Status: DC | PRN

## 2019-05-02 MED ORDER — HYDRALAZINE HCL 20 MG/ML IJ SOLN: 5.0000 mg | INTRAMUSCULAR | Status: DC | PRN

## 2019-05-02 MED ORDER — SODIUM CHLORIDE 0.9 % IV BOLUS
500.0000 mL | Freq: Once | INTRAVENOUS | Status: AC
  Administered 2019-05-02: 500 mL via INTRAVENOUS

## 2019-05-02 MED ORDER — MIDAZOLAM HCL 2 MG/2ML IJ SOLN
INTRAMUSCULAR | Status: DC | PRN
  Administered 2019-05-02: 2 mg via INTRAVENOUS

## 2019-05-02 MED ORDER — HEPARIN (PORCINE) IN NACL 1000-0.9 UT/500ML-% IV SOLN
INTRAVENOUS | Status: AC
  Filled 2019-05-02: qty 1000

## 2019-05-02 MED ORDER — CLOPIDOGREL BISULFATE 75 MG PO TABS: 75.0000 mg | ORAL_TABLET | Freq: Every day | ORAL | 0 refills | Status: DC

## 2019-05-02 MED ORDER — FENTANYL CITRATE (PF) 100 MCG/2ML IJ SOLN
INTRAMUSCULAR | Status: DC | PRN
  Administered 2019-05-02: 50 ug via INTRAVENOUS

## 2019-05-02 MED ORDER — HEPARIN (PORCINE) IN NACL 1000-0.9 UT/500ML-% IV SOLN
INTRAVENOUS | Status: DC | PRN
  Administered 2019-05-02 (×2): 500 mL

## 2019-05-02 MED ORDER — LIDOCAINE HCL (PF) 1 % IJ SOLN
INTRAMUSCULAR | Status: AC
  Filled 2019-05-02: qty 30

## 2019-05-02 SURGICAL SUPPLY — 18 items
BAG SNAP BAND KOVER 36X36 (MISCELLANEOUS) ×2 IMPLANT
CATH OMNI FLUSH 5F 65CM (CATHETERS) ×1 IMPLANT
CATH SOFT-VU ST 4F 90CM (CATHETERS) ×1 IMPLANT
CATH TEMPO AQUA 5F 100CM (CATHETERS) ×1 IMPLANT
COVER DOME SNAP 22 D (MISCELLANEOUS) ×1 IMPLANT
GLIDEWIRE ADV .035X260CM (WIRE) ×1 IMPLANT
KIT HEART LEFT (KITS) ×1 IMPLANT
KIT MICROPUNCTURE NIT STIFF (SHEATH) ×1 IMPLANT
SHEATH PINNACLE 5F 10CM (SHEATH) ×2 IMPLANT
SHEATH PROBE COVER 6X72 (BAG) ×1 IMPLANT
STOPCOCK MORSE 400PSI 3WAY (MISCELLANEOUS) ×1 IMPLANT
SYR MEDRAD MARK 7 150ML (SYRINGE) ×2 IMPLANT
TRANSDUCER W/STOPCOCK (MISCELLANEOUS) ×2 IMPLANT
TRAY PV CATH (CUSTOM PROCEDURE TRAY) ×2 IMPLANT
TUBING CIL FLEX 10 FLL-RA (TUBING) ×1 IMPLANT
TUBING HIGH PRESSURE 120CM (CONNECTOR) ×1 IMPLANT
WIRE AMPLATZ SS-J .035X260CM (WIRE) ×1 IMPLANT
WIRE BENTSON .035X145CM (WIRE) ×1 IMPLANT

## 2019-05-02 NOTE — Discharge Instructions (Signed)
Femoral Site Care °This sheet gives you information about how to care for yourself after your procedure. Your health care provider may also give you more specific instructions. If you have problems or questions, contact your health care provider. °What can I expect after the procedure? °After the procedure, it is common to have: °· Bruising that usually fades within 1-2 weeks. °· Tenderness at the site. °Follow these instructions at home: °Wound care °· Follow instructions from your health care provider about how to take care of your insertion site. Make sure you: °? Wash your hands with soap and water before you change your bandage (dressing). If soap and water are not available, use hand sanitizer. °? Change your dressing as told by your health care provider. °? Leave stitches (sutures), skin glue, or adhesive strips in place. These skin closures may need to stay in place for 2 weeks or longer. If adhesive strip edges start to loosen and curl up, you may trim the loose edges. Do not remove adhesive strips completely unless your health care provider tells you to do that. °· Do not take baths, swim, or use a hot tub until your health care provider approves. °· You may shower 24-48 hours after the procedure or as told by your health care provider. °? Gently wash the site with plain soap and water. °? Pat the area dry with a clean towel. °? Do not rub the site. This may cause bleeding. °· Do not apply powder or lotion to the site. Keep the site clean and dry. °· Check your femoral site every day for signs of infection. Check for: °? Redness, swelling, or pain. °? Fluid or blood. °? Warmth. °? Pus or a bad smell. °Activity °· For the first 2-3 days after your procedure, or as long as directed: °? Avoid climbing stairs as much as possible. °? Do not squat. °· Do not lift anything that is heavier than 10 lb (4.5 kg), or the limit that you are told, until your health care provider says that it is safe. °· Rest as  directed. °? Avoid sitting for a long time without moving. Get up to take short walks every 1-2 hours. °· Do not drive for 24 hours if you were given a medicine to help you relax (sedative). °General instructions °· Take over-the-counter and prescription medicines only as told by your health care provider. °· Keep all follow-up visits as told by your health care provider. This is important. °Contact a health care provider if you have: °· A fever or chills. °· You have redness, swelling, or pain around your insertion site. °Get help right away if: °· The catheter insertion area swells very fast. °· You pass out. °· You suddenly start to sweat or your skin gets clammy. °· The catheter insertion area is bleeding, and the bleeding does not stop when you hold steady pressure on the area. °· The area near or just beyond the catheter insertion site becomes pale, cool, tingly, or numb. °These symptoms may represent a serious problem that is an emergency. Do not wait to see if the symptoms will go away. Get medical help right away. Call your local emergency services (911 in the [place] Do not drive yourself to the hospital. °Summary °· After the procedure, it is common to have bruising that usually fades within 1-2 weeks. °· Check your femoral site every day for signs of infection. °· Do not lift anything that is heavier than 10 lb (4.5 kg), or the   limit that you are told, until your health care provider says that it is safe. °This information is not intended to replace advice given to you by your health care provider. Make sure you discuss any questions you have with your health care provider. °Document Released: 06/29/2014 Document Revised: 11/08/2017 Document Reviewed: 11/08/2017 °Elsevier Interactive Patient Education © 2019 [employer] ° °

## 2019-05-02 NOTE — Interval H&P Note (Signed)
History and Physical Interval Note:  05/02/2019 7:39 AM  Eduardo [name]  has presented today for surgery, with the diagnosis of PAD.  The various methods of treatment have been discussed with the patient and family. After consideration of risks, benefits and other options for treatment, the patient has consented to  Procedure(s): LOWER EXTREMITY ANGIOGRAPHY (Bilateral) as a surgical intervention.  The patient's history has been reviewed, patient examined, no change in status, stable for surgery.  I have reviewed the patient's chart and labs.  Questions were answered to the patient's satisfaction.     [name]

## 2019-05-04 DIAGNOSIS — L03119 Cellulitis of unspecified part of limb: Secondary | ICD-10-CM | POA: Diagnosis not present

## 2019-05-04 DIAGNOSIS — S91302A Unspecified open wound, left foot, initial encounter: Secondary | ICD-10-CM | POA: Diagnosis not present

## 2019-05-04 DIAGNOSIS — Z794 Long term (current) use of insulin: Secondary | ICD-10-CM | POA: Diagnosis not present

## 2019-05-04 DIAGNOSIS — L97522 Non-pressure chronic ulcer of other part of left foot with fat layer exposed: Secondary | ICD-10-CM | POA: Diagnosis not present

## 2019-05-04 DIAGNOSIS — I10 Essential (primary) hypertension: Secondary | ICD-10-CM | POA: Diagnosis not present

## 2019-05-04 DIAGNOSIS — S91301A Unspecified open wound, right foot, initial encounter: Secondary | ICD-10-CM | POA: Diagnosis not present

## 2019-05-04 DIAGNOSIS — E11621 Type 2 diabetes mellitus with foot ulcer: Secondary | ICD-10-CM | POA: Diagnosis not present

## 2019-05-04 DIAGNOSIS — L97516 Non-pressure chronic ulcer of other part of right foot with bone involvement without evidence of necrosis: Secondary | ICD-10-CM | POA: Diagnosis not present

## 2019-05-04 DIAGNOSIS — B951 Streptococcus, group B, as the cause of diseases classified elsewhere: Secondary | ICD-10-CM | POA: Diagnosis not present

## 2019-05-04 DIAGNOSIS — E1151 Type 2 diabetes mellitus with diabetic peripheral angiopathy without gangrene: Secondary | ICD-10-CM | POA: Diagnosis not present

## 2019-05-04 DIAGNOSIS — E1142 Type 2 diabetes mellitus with diabetic polyneuropathy: Secondary | ICD-10-CM | POA: Diagnosis not present

## 2019-05-04 DIAGNOSIS — I2510 Atherosclerotic heart disease of native coronary artery without angina pectoris: Secondary | ICD-10-CM | POA: Diagnosis not present

## 2019-05-05 ENCOUNTER — Ambulatory Visit: Payer: BC Managed Care – PPO | Admitting: Podiatry

## 2019-05-08 ENCOUNTER — Ambulatory Visit: Payer: BLUE CROSS/BLUE SHIELD | Admitting: Cardiology

## 2019-05-08 ENCOUNTER — Encounter (HOSPITAL_COMMUNITY): Payer: Self-pay | Admitting: Cardiology

## 2019-05-09 ENCOUNTER — Other Ambulatory Visit: Payer: Self-pay

## 2019-05-09 ENCOUNTER — Encounter: Payer: Self-pay | Admitting: Cardiology

## 2019-05-09 ENCOUNTER — Ambulatory Visit (INDEPENDENT_AMBULATORY_CARE_PROVIDER_SITE_OTHER): Payer: BC Managed Care – PPO | Admitting: Cardiology

## 2019-05-09 ENCOUNTER — Ambulatory Visit (HOSPITAL_COMMUNITY)
Admission: RE | Admit: 2019-05-09 | Discharge: 2019-05-09 | Disposition: A | Discharge status: Home / Self Care | Payer: BC Managed Care – PPO | Source: Ambulatory Visit | Attending: Internal Medicine | Admitting: Internal Medicine

## 2019-05-09 VITALS — BP 115/59 | HR 66 | Temp 97.7°F | Ht 70.0 in | Wt 283.8 lb

## 2019-05-09 DIAGNOSIS — L98491 Non-pressure chronic ulcer of skin of other sites limited to breakdown of skin: Secondary | ICD-10-CM | POA: Diagnosis not present

## 2019-05-09 DIAGNOSIS — M609 Myositis, unspecified: Secondary | ICD-10-CM | POA: Diagnosis not present

## 2019-05-09 DIAGNOSIS — E11621 Type 2 diabetes mellitus with foot ulcer: Secondary | ICD-10-CM | POA: Insufficient documentation

## 2019-05-09 DIAGNOSIS — I739 Peripheral vascular disease, unspecified: Secondary | ICD-10-CM | POA: Diagnosis not present

## 2019-05-09 DIAGNOSIS — I2510 Atherosclerotic heart disease of native coronary artery without angina pectoris: Secondary | ICD-10-CM

## 2019-05-09 DIAGNOSIS — E08621 Diabetes mellitus due to underlying condition with foot ulcer: Secondary | ICD-10-CM

## 2019-05-09 DIAGNOSIS — L97519 Non-pressure chronic ulcer of other part of right foot with unspecified severity: Secondary | ICD-10-CM | POA: Insufficient documentation

## 2019-05-09 DIAGNOSIS — L89891 Pressure ulcer of other site, stage 1: Secondary | ICD-10-CM

## 2019-05-09 DIAGNOSIS — L03115 Cellulitis of right lower limb: Secondary | ICD-10-CM | POA: Diagnosis not present

## 2019-05-09 MED ORDER — GADOBUTROL 1 MMOL/ML IV SOLN: 8.0000 mL | Freq: Once | INTRAVENOUS | Status: DC | PRN

## 2019-05-09 MED ORDER — ASPIRIN 81 MG PO CHEW: 81.0000 mg | CHEWABLE_TABLET | Freq: Once | ORAL | 3 refills | Status: AC

## 2019-05-09 NOTE — Progress Notes (Signed)
Primary Physician/Referring:  Rita Ohara, MD  Patient ID: Eduardo [name], male    DOB: 05-09-1956, 63 y.o.   MRN: [id number]  Chief Complaint  Patient presents with  . Hypertension  . PAD  . Follow-up    PV cath    HPI: Eduardo [name]  is a 63 y.o. male  with controlled type 2 diabetes, hypertension, hyperlipidemia, coronary artery disease by angiography on 09/08/2016 revealing severe triple-vessel CAD with no targets for CABG, presently on aggressive medical therapy, mild carotid atherosclerosis, right SFA occlusion S/P complex PV angiogram on 02/28/2019 with PTA to occluded right SFA with implantation of 3 overlapping 6.0 x 120 x2; 6.0 x 100 mm Eluvia DES. Since then the ulcer is healing well.   Patient underwent repeat peripheral arteriogram on 05/02/2019 to evaluate left lower extremity ulceration and PAD.  Eduardo [name] was found to have two-vessel runoff below the left knee with occluded left anterior tibial.  No significant high-grade stenosis was evident.  Was recommended medical therapy.  Eduardo [name] now presents to the office for follow-up. Eduardo [name] is being followed by Dr. Jacqualyn Posey from podiatry.    Past Medical History:  Diagnosis Date  . Colon polyp   . Diabetes mellitus 1987   under care of Dr. Chalmers Cater.  On insulin since 96 (off and on)  . Diabetic retinopathy   . Dupuytren contracture    R hand, s/p injection (Dr. Lenon Curt)  . Essential hypertension, benign   . Essential hypertension, benign 02/06/2019  . Frequency of urination and polyuria   . Hypertension   . Myocardial infarction ([place])   . Neuromuscular disorder ([place])    Diabetic neuropathy  . Other testicular hypofunction   . Peripheral arterial disease ([place]) 10/28/2012  . Peritoneal abscess ([place]) 6/08   and buttock.  . Polydipsia   . Proteinuria   . Pure hyperglyceridemia     Past Surgical History:  Procedure Laterality Date  . ABDOMINAL AORTAGRAM N/A 04/18/2012   Procedure: ABDOMINAL Maxcine Ham;  Surgeon: Angelia Mould, MD;   Location: [facility] CATH LAB;  Service: Cardiovascular;  Laterality: N/A;  . CARDIAC CATHETERIZATION N/A 09/08/2016   Procedure: Left Heart Cath and Coronary Angiography;  Surgeon: Adrian Prows, MD;  Location: [place] CV LAB;  Service: Cardiovascular;  Laterality: N/A;  . CATARACT EXTRACTION, BILATERAL  09/2017, 10/2017   Dr. Herbert Deaner  . LOWER EXTREMITY ANGIOGRAM Bilateral 04/18/2012   Procedure: LOWER EXTREMITY ANGIOGRAM;  Surgeon: Angelia Mould, MD;  Location: [facility] CATH LAB;  Service: Cardiovascular;  Laterality: Bilateral;  bilat lower extrem angio  . LOWER EXTREMITY ANGIOGRAPHY Bilateral 02/28/2019   Procedure: LOWER EXTREMITY ANGIOGRAPHY;  Surgeon: Adrian Prows, MD;  Location: [place] CV LAB;  Service: Cardiovascular;  Laterality: Bilateral;  . LOWER EXTREMITY ANGIOGRAPHY Bilateral 05/02/2019   Procedure: LOWER EXTREMITY ANGIOGRAPHY;  Surgeon: Adrian Prows, MD;  Location: [place] [facility] [place] CV LAB;  Service: Cardiovascular;  Laterality: Bilateral;  . macular photocoagulation     (eye treatments for diabetic retinopathy)-Dr. Zigmund Daniel  . PERIPHERAL VASCULAR INTERVENTION  02/28/2019   Procedure: PERIPHERAL VASCULAR INTERVENTION;  Surgeon: Adrian Prows, MD;  Location: [place] CV LAB;  Service: Cardiovascular;;    Social History   Socioeconomic History  . Marital status: Widowed    Spouse name: Not on file  . Number of children: 0  . Years of education: Not on file  . Highest education level: Not on file  Occupational History  . 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 Financial resource strain: Not on file  . Food insecurity    Worry: Not on file    Inability: Not on file  . Transportation needs    Medical: Not on file    Non-medical: Not on file  Tobacco Use  . Smoking status: Former Smoker    Packs/day: 1.00    Years: 30.00    Pack years: 30.00    Types: Cigarettes    Quit date: 01/08/2012    Years since quitting: 7.3   . Smokeless tobacco: Never Used  Substance and Sexual Activity  . Alcohol use: No  . Drug use: No  . Sexual activity: Not Currently  Lifestyle  . Physical activity    Days per week: Not on file    Minutes per session: Not on file  . Stress: Not on file  Relationships  . Social Herbalist on phone: Not on file    Gets together: Not on file    Attends religious service: Not on file    Active member of club or organization: Not on file    Attends meetings of clubs or organizations: Not on file    Relationship status: Not on file  . Intimate partner violence    Fear of current or ex partner: Not on file    Emotionally abused: Not on file    Physically abused: Not on file    Forced sexual activity: Not on file  Other Topics Concern  . Not on file  Social History Narrative   Widowed. Previously traveled 40 weeks out of the year. No longer travels as much. No pets    Current Outpatient Medications on File Prior to Visit  Medication Sig Dispense Refill  . amoxicillin-clavulanate (AUGMENTIN) 500-125 MG tablet Take 1 tablet by mouth 2 (two) times a day.    Marland Kitchen Bioflavonoid Products (ESTER-C) 1000-50 MG TABS Take 1 tablet by mouth daily.     . Cholecalciferol (VITAMIN D) 2000 units CAPS Take 2,000 Units by mouth daily.    . clopidogrel (PLAVIX) 75 MG tablet Take 1 tablet (75 mg total) by mouth daily. 90 tablet 0  . furosemide (LASIX) 20 MG tablet Take 20 mg by mouth daily.   5  . gemfibrozil (LOPID) 600 MG tablet Take 600 mg by mouth 2 (two) times daily before a meal.      . HUMALOG KWIKPEN 100 UNIT/ML KiwkPen Inject 0-20 Units into the skin 2 (two) times daily. SLIDING SCALE DEPENDING ON BLOOD GLUCOSE BEFORE MEAL  1  . HUMULIN N KWIKPEN 100 UNIT/ML Kiwkpen Inject 30-40 Units into the skin See admin instructions. Inject 40 units subcutaneously in the morning & 30 units in the evening.  1  . isosorbide mononitrate (IMDUR) 60 MG 24 hr tablet TAKE 1 TABLET BY MOUTH DAILY (Patient  taking differently: Take 60 mg by mouth daily. ) 90 tablet 2  . Krill Oil 350 MG CAPS Take 350 mg by mouth daily. Move Free Ultra Krill Oil    . lisinopril (PRINIVIL,ZESTRIL) 10 MG tablet Take 10 mg by mouth daily.   3  . metoprolol succinate (TOPROL-XL) 100 MG 24 hr tablet Take 100 mg by mouth daily. Take with or immediately following a meal.    . Multiple Vitamin (MULTIVITAMIN) tablet Take 1 tablet by mouth daily.    . nitroGLYCERIN (NITROSTAT) 0.4 MG SL tablet Place 0.4 mg under the tongue every 5 (five) minutes as needed for chest pain.  1  . Probiotic Product (PROBIOTIC-10 PO) Take 1 capsule by mouth daily.    . rosuvastatin (CRESTOR) 20 MG tablet Take 20 mg by mouth daily.  3  . B-D UF III MINI PEN NEEDLES 31G X 5 MM MISC U UTD 6 TIMES A DAY  0  . Continuous Blood Gluc Sensor (FREESTYLE LIBRE 14 DAY SENSOR) MISC USE UTD Q 14 DAYS SUBCUTANEOUS     No current facility-administered medications on file prior to visit.     Review of Systems  Constitution: Negative for chills, decreased appetite, malaise/fatigue and weight gain.  Cardiovascular: Positive for claudication (minimal. Ulcer rght and left foot) and dyspnea on exertion (stable). Negative for leg swelling and syncope.  Endocrine: Negative for cold intolerance.  Hematologic/Lymphatic: Does not bruise/bleed easily.  Musculoskeletal: Negative for joint swelling.  Gastrointestinal: Negative for abdominal pain, anorexia and change in bowel habit.  Neurological: Negative for headaches and light-headedness.  Psychiatric/Behavioral: Negative for depression and substance abuse.  All other systems reviewed and are negative.     Objective  Blood pressure (!) 115/59, pulse 66, temperature 97.7 F (36.5 C), height _0  (1.778 m), weight 283 lb 12.8 oz (128.7 kg), SpO2 97 %. Body mass index is 40.72 kg/m.    Physical Exam  Constitutional: Eduardo [name] appears well-developed. No distress.  Morbidly obese  HENT:  Head: Atraumatic.  Eyes:  Conjunctivae are normal.  Neck: Neck supple. No thyromegaly present.  Short neck and difficult to evaluate JVP  Cardiovascular: Exam reveals no gallop.  No murmur heard. Pulses:      Carotid pulses are 2+ on the right side and 2+ on the left side.      Dorsalis pedis pulses are 2+ on the right side and 1+ on the left side.       Posterior tibial pulses are 0 on the right side and 1+ on the left side.  Femoral and popliteal pulse difficult to feel due to patient's body habitus.  Pulmonary/Chest: Effort normal.  Abdominal: Soft.  Obese.   Musculoskeletal: Normal range of motion.        General: No edema.  Neurological: Eduardo [name] is alert.  Skin: Skin is dry.  Psychiatric: Eduardo [name] has a normal mood and affect.   Radiology: No results found.  Laboratory examination:   01/19/2018: Cholesterol 115, triglycerides 112, HDL 39, LDL 54.  Glucose 125, creatinine 1.16, potassium 5.2, EGFR 67, CMP otherwise normal.  CBC normal.  TSH 1.5.  05/24/2017: Creatinine 1.6, potassium 4.9, BMP otherwise normal.  Hemoglobin A1c 7.1%.  Liver enzymes normal.  Cholesterol 92, triglycerides 72, HDL 36, LDL 42.  CMP Latest Ref Rng & Units 04/21/2019 02/23/2019 06/06/2018  Glucose 65 - 99 mg/dL 140(H) 85 171(H)  BUN 8 - 27 mg/dL 31(H) 32(H) 30(H)  Creatinine 0.76 - 1.27 mg/dL 1.31(H) 1.40(H) 1.27  Sodium 134 - 144 mmol/L 139 142 142  Potassium 3.5 - 5.2 mmol/L 4.9 4.6 4.9  Chloride 96 - 106 mmol/L 104 103 102  CO2 20 - 29 mmol/L _1 Calcium 8.6 - 10.2 mg/dL 8.6 9.2 9.5  Total Protein 6.0 - 8.5 g/dL 5.8(L) - -  Total Bilirubin 0.0 - 1.2 mg/dL 0.4 - -  Alkaline Phos 39 - 117 IU/L 68 - -  AST 0 - 40 IU/L 21 - -  ALT 0 - 44 IU/L 20 - -   CBC Latest Ref Rng & Units 04/21/2019 02/23/2019 04/18/2012  WBC 3.4 - 10.8 x10E3/uL 7.7 7.9 -  Hemoglobin 13.0 -  17.7 g/dL 15.3 15.8 13.6  Hematocrit 37.5 - 51.0 % 44.9 45.9 40.0  Platelets 150 - 450 x10E3/uL 198 211 -   Lipid Panel     Component Value Date/Time   CHOL 108  04/21/2019 0912   TRIG 102 04/21/2019 0912   HDL 33 (L) 04/21/2019 0912   CHOLHDL 4.4 09/28/2011 1358   VLDL 21 09/28/2011 1358   LDLCALC 55 04/21/2019 0912   HEMOGLOBIN A1C Lab Results  Component Value Date   HGBA1C 7.2 04/14/2019   MPG 192 (H) 09/28/2011   TSH No results for input(s): TSH in the last 8760 hours.  Cardiac Studies:   Peripheral arteriogram 05/02/2019: Right common femoral artery and right proximal SFA previously placed stent widely patent.  Right iliac artery shows mild disease. Left iliac artery and left femoral arteries show very mild disease.  There is two-vessel runoff in the left leg, left AT is occluded. Intermediate stenosis noted in the left distal and proximal SFA, pressure pullback reveals no significant gradient.  Peripheral arteriogram 02/28/2019: Pelvic aortogram with limited bifemoral arteriogram revealed patent distal abdominal aorta without aneurysm, patent, bilateral iliac and common femoral vessels. Right SFA is occluded in the ostium and reconstitutes outside of the Hunter's canal.  Below the right knee there is two-vessel runoff, AT is occluded.  Left leg was not studied beyond left common femoral artery. Successful PTA and stenting of right SFA, prolonged procedure, difficult procedure with use of multiple balloons, guidewires and catheters.  100% stenosis reduced to 0% with implantation of 3 overlapping 6.0 x 120 x2; 6.0 x 100 mm Eluvia DES. 180 mL contrast utilized.  Coronary angiogram 09/08/2016: Low normal LVEF, 44-50% with apical akinesis. Occluded mid LAD, D2 mod sized 80% stenosis, OM1 bifurcates, occluded in the proximal segment. Large dominant RCA. 4 PL branches, large PL 1 with 80% stenosis, large PL 3 long segment occlusion. All CTO's have ipsilateral and contralateral faint collaterals. Calcification.  Echocardiogram 09/01/2016: Left ventricle cavity is normal in size. Moderate concentric hypertrophy of the left ventricle. Mild to  moderately reduced left ventricular function. Left ventricle regional wall motion findings: Poor echo window and reduced sensitivity. There is inferior hypokinesis, lateral hypokinesis, mild global hypokinesis. Grade 1 diastolic dysfunction. Calculated EF 40%. Left atrial cavity is mildly dilated at 4.2 cm. IVC is dilated with poor inspiration collapse consistent with elevated right atrial pressure.  Exercise sestamibi stress test 08/17/2016: 1. Resting EKG demonstrates normal sinus rhythm, left axis deviation, left plantar fascicular block. Poor R-wave progression, ulnar disease pattern. Nonspecific ST-T abnormality, cannot exclude lateral ischemia. Stress EKG is equivocal for ischemia, patient developing atypical left otherwise for with exercise which reverted back to baseline immediately on combination of the stress test less than 60 seconds. There was no additional ST-T wave changes of ischemia. Patient exercised on Bruce protocol for 4:25 minutes and achieved 5.45 METS. Stress test terminated due to 89 % MPHR achieved (Target HR >85%). Symptoms included dizziness.  2. 2-Day protocol followed. Perfusion imaging studies demonstrate large sized severe perfusion defect involving the inferior, anterior and anterolateral consistent with inferior wall scar with moderate peri-infarct ischemia and severe anterior and anteroapical reversible ischemia extending from the base towards the apex. Left ventricular systolic function calculating by QGS was markedly depressed at 26%. This is a high risk study, consider further cardiac work-up.  Carotid artery duplex 02/16/2018: No hemodynamically significant arterial disease in the internal carotid artery bilaterally. Mild heterogenous plaque noted.  Antegrade right vertebral artery flow. Antegrade left vertebral  artery flow. Compared to the study done on 09/01/2016, left carotid noted as occlusion is an error. This may perhaps be due to low velocity noted  in both carotid arteries and may indicate low systemic BP or reduced cardiac output. Clinical correlation recommended.  ABI 06/10/2016: R ABI 0.72, L ABI 1.0  Peripheral arteriogram 04/18/2012:  Right SFA occluded from proximal to distal segment, reconstitutes at the level of the popliteal artery.  Two-vessel runoff in the form of peroneal and posterior tibial artery.  Left SFA mild disease, moderate disease in the peroneal and posterior tibial vessels, three-vessel runoff.   Assessment   Ischemic ulcer, limited to breakdown of skin ([place]) righ foot - Plan:   Pressure injury of dorsum of left foot, stage 1 - Plan:   Peripheral artery disease (HCC) - Plan: aspirin (ASPIRIN 81) 81 MG chewable tablet, PCV ANKLE BRACHIAL INDEX (ABI),   3-vessel coronary artery disease - Plan:   EKG 08/01/2018: Sinus bradycardia at the rate of 53 bpm, left axis deviation, poor R-wave progression, anteroseptal infarct old.  Nonspecific T abnormality.  Low-voltage complexes. No significant change from EKG 01/19/2018, 07/19/2017.  Recommendations:   Eduardo [name] is here in the office for follow-up of peripheral artery disease S/P right SFA stent on 02/28/19 with 6.0 x 120 x2; 6.0 x 100 mm Eluvia DES. Righ foot ulcer since has been healing.  Ulcer in the left foot is dry and stable and suspect it is a pressure ulcer and diabetes contributing. Eduardo [name] has brisk flow and expect healing. Do not think there will be limb or tissue loss. Eduardo [name] has not been recommended MRI of his right foot to exclude Osteomyelitis.  Eduardo [name] has discontinued aspirin advised him to restart.  Eduardo [name] will need dual antiplatelet therapy for at least 6 months in view of drug-eluting stent implantation to his right lower extremity.  ABI will be performed in 3 months.  For surveillance. His right groin site is healed well without complications.  I did not inspect the wound today.  I have reviewed his labs including lipid profile testing.  I have again discussed with him  regarding weight loss.  From cardiac standpoint Eduardo [name] has not had any angina pectoris, blood pressure is well controlled. Lipids are at goal.   Adrian Prows, MD, [facility] 05/09/2019, 10:53 AM [facility]. [place] Pager: [phone] Office: [phone] If no answer Cell [phone]

## 2019-05-11 ENCOUNTER — Ambulatory Visit: Payer: BC Managed Care – PPO | Admitting: Cardiology

## 2019-05-11 ENCOUNTER — Other Ambulatory Visit: Payer: BC Managed Care – PPO

## 2019-05-11 ENCOUNTER — Encounter (HOSPITAL_BASED_OUTPATIENT_CLINIC_OR_DEPARTMENT_OTHER): Payer: BC Managed Care – PPO | Attending: Internal Medicine

## 2019-05-11 ENCOUNTER — Other Ambulatory Visit: Payer: Self-pay

## 2019-05-11 DIAGNOSIS — I2510 Atherosclerotic heart disease of native coronary artery without angina pectoris: Secondary | ICD-10-CM | POA: Diagnosis not present

## 2019-05-11 DIAGNOSIS — M609 Myositis, unspecified: Secondary | ICD-10-CM | POA: Insufficient documentation

## 2019-05-11 DIAGNOSIS — L97516 Non-pressure chronic ulcer of other part of right foot with bone involvement without evidence of necrosis: Secondary | ICD-10-CM | POA: Diagnosis not present

## 2019-05-11 DIAGNOSIS — E1142 Type 2 diabetes mellitus with diabetic polyneuropathy: Secondary | ICD-10-CM | POA: Insufficient documentation

## 2019-05-11 DIAGNOSIS — S91301A Unspecified open wound, right foot, initial encounter: Secondary | ICD-10-CM | POA: Diagnosis not present

## 2019-05-11 DIAGNOSIS — E1169 Type 2 diabetes mellitus with other specified complication: Secondary | ICD-10-CM | POA: Insufficient documentation

## 2019-05-11 DIAGNOSIS — I739 Peripheral vascular disease, unspecified: Secondary | ICD-10-CM | POA: Diagnosis not present

## 2019-05-11 DIAGNOSIS — L03115 Cellulitis of right lower limb: Secondary | ICD-10-CM | POA: Insufficient documentation

## 2019-05-11 DIAGNOSIS — E1151 Type 2 diabetes mellitus with diabetic peripheral angiopathy without gangrene: Secondary | ICD-10-CM | POA: Diagnosis not present

## 2019-05-11 DIAGNOSIS — E11621 Type 2 diabetes mellitus with foot ulcer: Secondary | ICD-10-CM | POA: Diagnosis not present

## 2019-05-11 DIAGNOSIS — L97522 Non-pressure chronic ulcer of other part of left foot with fat layer exposed: Secondary | ICD-10-CM | POA: Insufficient documentation

## 2019-05-11 DIAGNOSIS — I10 Essential (primary) hypertension: Secondary | ICD-10-CM | POA: Insufficient documentation

## 2019-05-11 DIAGNOSIS — S91302A Unspecified open wound, left foot, initial encounter: Secondary | ICD-10-CM | POA: Diagnosis not present

## 2019-05-11 DIAGNOSIS — M869 Osteomyelitis, unspecified: Secondary | ICD-10-CM | POA: Insufficient documentation

## 2019-05-11 DIAGNOSIS — L03116 Cellulitis of left lower limb: Secondary | ICD-10-CM | POA: Diagnosis not present

## 2019-05-17 ENCOUNTER — Encounter (HOSPITAL_COMMUNITY): Payer: Self-pay | Admitting: Cardiology

## 2019-05-18 ENCOUNTER — Other Ambulatory Visit: Payer: Self-pay

## 2019-05-18 ENCOUNTER — Ambulatory Visit (INDEPENDENT_AMBULATORY_CARE_PROVIDER_SITE_OTHER): Payer: BC Managed Care – PPO | Admitting: Orthopedic Surgery

## 2019-05-18 ENCOUNTER — Encounter (HOSPITAL_COMMUNITY): Payer: Self-pay | Admitting: *Deleted

## 2019-05-18 ENCOUNTER — Encounter: Payer: Self-pay | Admitting: Orthopedic Surgery

## 2019-05-18 VITALS — Ht 70.0 in | Wt 283.0 lb

## 2019-05-18 DIAGNOSIS — M86271 Subacute osteomyelitis, right ankle and foot: Secondary | ICD-10-CM | POA: Diagnosis not present

## 2019-05-18 NOTE — Progress Notes (Signed)
Office Visit Note   Patient: Eduardo [name]           Date of Birth: 1956/05/29           MRN: [id number] Visit Date: 05/18/2019              Requested by: Rita Ohara, [address] [place],  [place] [address] PCP: Rita Ohara, MD  Chief Complaint  Patient presents with  . Right Foot - Open Wound, Pain      HPI: Patient is a 63 year old gentleman who is seen for initial evaluation in referral from the wound center.  Patient has peripheral vascular disease he is status post 3 stent placements to revascularize the right lower extremity.  He has a chronic ulcer over the fifth metatarsal head and is status post an MRI scan of the right foot.  Assessment & Plan: Visit Diagnoses:  1. Subacute osteomyelitis, right ankle and foot ([place])     Plan: Patient has good improvement in his circulation from the revascularization.  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  Discussed with the patient and recommendation to proceed with amputation of the fifth ray.  Risk and benefits were discussed including risk of the wound not healing need for additional surgery.  Patient states he understands wished to proceed at this time plan for surgery tomorrow.  Follow-Up Instructions: Return in about 1 week (around 05/25/2019).   Ortho Exam  Patient is alert, oriented, no adenopathy, well-dressed, normal affect, normal respiratory effort. Examination patient has a palpable dorsalis pedis and posterior tibial pulse.  He has a biphasic Doppler dorsalis pedis and posterior tibial pulse.  The has had ankle-brachial indices obtained with cardiology and by report has good revascularization.  He has a ulcer that is 2 cm in diameter over the fifth metatarsal head that probes to bone.  MRI scan shows osteomyelitis of the fifth metatarsal head and base of the proximal phalanx of the little toe.  Imaging: No results found. No images are attached to the encounter.   Labs: Lab Results  Component Value Date   HGBA1C 7.2 04/14/2019   HGBA1C 7.2 06/09/2018   HGBA1C 7.1% 05/24/2017   REPTSTATUS 05/02/2019 FINAL 04/28/2019   GRAMSTAIN  04/28/2019    NO WBC SEEN NO ORGANISMS SEEN Performed at [facility], [place] [address], [place], [place] [address]    CULT RARE ENTEROCOCCUS FAECALIS 04/28/2019   LABORGA ENTEROCOCCUS FAECALIS 04/28/2019     Lab Results  Component Value Date   ALBUMIN 3.8 04/21/2019   ALBUMIN 4.1 09/28/2011    No results found for: MG No results found for: VD25OH  No results found for: PREALBUMIN CBC EXTENDED Latest Ref Rng & Units 04/21/2019 02/23/2019 04/18/2012  WBC 3.4 - 10.8 x10E3/uL 7.7 7.9 -  RBC 4.14 - 5.80 x10E6/uL 4.85 5.05 -  HGB 13.0 - 17.7 g/dL 15.3 15.8 13.6  HCT 37.5 - 51.0 % 44.9 45.9 40.0  PLT 150 - 450 x10E3/uL 198 211 -  NEUTROABS 1.7 - 7.7 K/uL - - -  LYMPHSABS 0.7 - 4.0 K/uL - - -     Body mass index is 40.61 kg/m.  Orders:  No orders of the defined types were placed in this encounter.  No orders of the defined types were placed in this encounter.    Procedures: No procedures performed  Clinical Data: No additional findings.  ROS:  All other systems negative, except as noted in  the HPI. Review of Systems  Objective: Vital Signs: Ht 5\' 10"  (1.778 m)   Wt 283 lb (128.4 kg)   BMI 40.61 kg/m   Specialty Comments:  No specialty comments available.  PMFS History: Patient Active Problem List   Diagnosis Date Noted  . Ischemic ulcer of lower leg due to atherosclerosis ([place]) 02/28/2019  . Essential hypertension, benign 02/06/2019  . Hypercholesteremia 02/06/2019  . CKD (chronic kidney disease) stage 3, GFR 30-59 ml/min (HCC) 10/06/2017  . Proteinuria 10/06/2017  . Type 2 diabetes mellitus with nephropathy ([place]) 10/06/2017  . 3-vessel coronary artery disease 05/23/2017  . Abnormal nuclear stress test 09/06/2016  . PAD (peripheral artery disease) ([place]) 10/28/2012  . Morbid  obesity with BMI of 40.0-44.9, adult ([place]) 10/13/2012  . Diabetes mellitus with ophthalmic complication ([place]) 37/02/8888  . Hypertension associated with diabetes ([place]) 10/13/2012  . Atherosclerosis of native artery of extremity with intermittent claudication ([place]) 04/05/2012  . Adenomatous colon polyp 09/28/2011  . Pure hyperglyceridemia 09/28/2011  . Erectile dysfunction 09/28/2011  . Diabetic retinopathy ([place]) 09/28/2011   Past Medical History:  Diagnosis Date  . Colon polyp   . Diabetes mellitus 1987   under care of Dr. Chalmers Cater.  On insulin since 96 (off and on)  . Diabetic retinopathy   . Dupuytren contracture    R hand, s/p injection (Dr. Lenon Curt)  . Essential hypertension, benign   . Essential hypertension, benign 02/06/2019  . Frequency of urination and polyuria   . Hypertension   . Myocardial infarction ([place])   . Neuromuscular disorder ([place])    Diabetic neuropathy  . Other testicular hypofunction   . Peripheral arterial disease ([place]) 10/28/2012  . Peritoneal abscess ([place]) 6/08   and buttock.  . Polydipsia   . Proteinuria   . Pure hyperglyceridemia     Family History  Problem Relation Age of Onset  . Diabetes Mother   . Hearing loss Mother   . Hypertension Mother   . Hyperlipidemia Mother   . Heart disease Mother   . Varicose Veins Mother   . Varicose Veins Father   . Dementia Father   . Hyperlipidemia Brother   . Diabetes Maternal Grandmother     Past Surgical History:  Procedure Laterality Date  . ABDOMINAL AORTAGRAM N/A 04/18/2012   Procedure: ABDOMINAL Maxcine Ham;  Surgeon: Angelia Mould, MD;  [facility] CATH LAB;  Service: Cardiovascular;  Laterality: N/A;  . CARDIAC CATHETERIZATION N/A 09/08/2016   Procedure: Left Heart Cath and Coronary Angiography;  Surgeon: Adrian Prows, MD;  Location: [facility] CV LAB;  Service: Cardiovascular;  Laterality: N/A;  . CATARACT EXTRACTION, BILATERAL  09/2017, 10/2017   Dr. Herbert Deaner  . LOWER EXTREMITY ANGIOGRAM  Bilateral 04/18/2012   Procedure: LOWER EXTREMITY ANGIOGRAM;  Surgeon: Angelia Mould, MD;  Location[facility] CATH LAB;  Service: Cardiovascular;  Laterality: Bilateral;  bilat lower extrem angio  . LOWER EXTREMITY ANGIOGRAPHY Bilateral 05/02/2019   Procedure: LOWER EXTREMITY ANGIOGRAPHY;  Surgeon: Adrian Prows, MD;  Location: [place] CV LAB;  Service: Cardiovascular;  Laterality: Bilateral;  . LOWER EXTREMITY ANGIOGRAPHY Bilateral 02/28/2019   Procedure: LOWER EXTREMITY ANGIOGRAPHY;  Surgeon: Adrian Prows, MD;  Location: [place] CV LAB;  Service: Cardiovascular;  Laterality: Bilateral;  . macular photocoagulation     (eye treatments for diabetic retinopathy)-Dr. Zigmund Daniel  . PERIPHERAL VASCULAR INTERVENTION  02/28/2019   Procedure: PERIPHERAL VASCULAR INTERVENTION;  Surgeon: Adrian Prows, MD;  Location: [place] CV LAB;  Service: Cardiovascular;;   Social History  Occupational History  . 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 Smoking status: Former Smoker    Packs/day: 1.00    Years: 30.00    Pack years: 30.00    Types: Cigarettes    Quit date: 01/08/2012    Years since quitting: 7.3  . Smokeless tobacco: Never Used  Substance and Sexual Activity  . Alcohol use: No  . Drug use: No  . Sexual activity: Not Currently

## 2019-05-18 NOTE — Progress Notes (Signed)
Offered pt to have COVID test done at [facility] prior to arriving for surgery. Pt sts the last time he went to get tested, it took 20 minutes, and he does not want to miss his surgery.

## 2019-05-18 NOTE — Progress Notes (Signed)
Pt denies SOB and chest pain. Pt stated that he is under the care of Dr. Einar Gip, Cardiology, Dr. Rita Ohara, PCP and Dr. Chalmers Cater, Endocrinology. Pt denies having a chest x ray within the last year. Pt denies recent labs. Pt made aware to stop taking vitamins, Sarahanne Novakowski-C,  fish oil,  Krill oil and herbal medications. Do not take any NSAIDs ie: Ibuprofen, Advil, Naproxen (Aleve), Motrin, BC and Goody Powder. Malachy Mood, Surgical Coordinator, stated that pt can continue taking Plavix.  Pt made aware to take 15 units of Humulin N tonight. Pt made aware that if BG is > 70 on DOS to take 20 units of Humulin N. Pt made aware to take 1/2 correction dose of Humalog for BG > 220 on DOS. Pt made aware to check BG every 2 hours prior to arrival to hospital on DOS. Pt made aware to not take any insulin on DOS if BG  Is < 70. Pt made aware to treat a BG < 70 with 4 glucose tabs or or 4 ounces of apple or cranberry juice, wait 15 minutes after intervention to recheck BG, if BG remains < 70, call Short Stay unit to speak with a nurse.  Pt denies that he and members of his family tested positive for COVID-19. ( pt to be tested on DOS and reminded to quarantine).  Pt stated that he is unable to go for COVID-19 testing prior to arrival on DOS. Pt denies that he and family members  experienced the following symptoms:  Cough yes/no: No Fever (>100.71F)  yes/no: No Runny nose yes/no: No Sore throat yes/no: No Difficulty breathing/shortness of breath  yes/no: No  Have you or a family member traveled in the last 14 days and where? yes/no: No  Pt reminded that hospital visitation restrictions are in effect and the importance of the restrictions.  Pt verbalized understanding of all pre-op instructions.  PA, Anesthesiology, asked to review pt hx; see note.

## 2019-05-18 NOTE — Anesthesia Preprocedure Evaluation (Addendum)
Anesthesia Evaluation  Patient identified by MRN, date of birth, ID band Patient awake    Reviewed: Allergy & Precautions, NPO status , Patient's Chart, lab work & pertinent test results  Airway Mallampati: II  TM Distance: >3 FB Neck ROM: Full    Dental no notable dental hx.    Pulmonary neg pulmonary ROS, former smoker,    Pulmonary exam normal breath sounds clear to auscultation       Cardiovascular hypertension, Pt. on medications + CAD, + Past MI and + Peripheral Vascular Disease  Normal cardiovascular exam Rhythm:Regular Rate:Normal  Stress Test 2017 EF 26%, large reversible inferior infarct   LHC 2017 1. Low normal LV systolic function, EF 11-57% with apical akinesis to dyskinesis. 2. Moderate coronary artery calcification, diffuse 3. Mid LAD occluded and collateralized by faint ipsilateral and contralateral collaterals from RCA. Diagonal 1 is moderate sized with ostial 30% stenosis, diagonal 2 is small to moderate sized and bifurcates, proximal segment has 80% stenosis 4. Moderate to circumflex, giving origin to moderate sized OM 1 which bifurcates. OM1 is occluded and has ipsilateral collaterals. 5. Large RCA. Dominant. Proximal 30%. Calcified. Gives origin to 4 PL branches, PL 1 large with ostial 80% stenosis, PL 2 small, PLT 3 large and has a long segment occlusion and collaterals from both left and ipsilateral collaterals. PL 4 is moderate-sized mild disease. 6. Patent LIMA and RIMA. Medical Management   Neuro/Psych negative neurological ROS  negative psych ROS   GI/Hepatic negative GI ROS, Neg liver ROS,   Endo/Other  diabetesMorbid obesity  Renal/GU Renal InsufficiencyRenal disease  negative genitourinary   Musculoskeletal negative musculoskeletal ROS (+)   Abdominal (+) + obese,   Peds negative pediatric ROS (+)  Hematology  (+) Blood dyscrasia (on plavix), ,   Anesthesia Other Findings   Reproductive/Obstetrics negative OB ROS                          Anesthesia Physical Anesthesia Plan  ASA: III  Anesthesia Plan: General   Post-op Pain Management:    Induction: Intravenous  PONV Risk Score and Plan: 2 and Ondansetron, Midazolam and Treatment may vary due to age or medical condition  Airway Management Planned: LMA  Additional Equipment:   Intra-op Plan:   Post-operative Plan: Extubation in OR  Informed Consent: I have reviewed the patients History and Physical, chart, labs and discussed the procedure including the risks, benefits and alternatives for the proposed anesthesia with the patient or authorized representative who has indicated his/her understanding and acceptance.     Dental advisory given  Plan Discussed with: CRNA  Anesthesia Plan Comments: (Follows with Dr. Einar Gip for for management of triple-vessel CAD deemed not amenable to CABG, presently on aggressive medical therapy. Last seen 05/09/19, no changes to CAD management at that time. Dr. Einar Gip has also been treating/following his severe PVD, S/P right SFA stent on 02/28/19.  Coronary angiogram 09/08/2016:  Low normal LVEF, 44-50% with apical akinesis. Occluded mid LAD, D2 mod sized 80% stenosis, OM1 bifurcates, occluded in the proximal segment. Large dominant RCA. 4 PL branches, large PL 1 with 80% stenosis, large PL 3 long segment occlusion. All CTO's have ipsilateral and contralateral faint collaterals. Calcification.  Recommendation: Patient has complex coronary artery disease, however the LAD and circumflex coronary artery may be amenable for coronary angioplasty, right coronary artery is diffusely diseased and hence only would recommend medical therapy. CABG with LIMA to LAD and potential use of a  arterial conduit to the circumflex is also good option in a patient with diabetes mellitus. Right coronary artery does not appear to be amenable for the CABG. Will discuss with  colleagues. Patient with morbid obesity, uncontrolled diabetes mellitus needs aggressive risk modification. He'll be discharged home with outpatient management for now.  Echocardiogram 09/01/2016: Left ventricle cavity is normal in size. Moderate concentric hypertrophy of the left ventricle. Mild to moderately reduced left ventricular function. Left ventricle regional wall motion findings: Poor echo window and reduced sensitivity. There is inferior hypokinesis, lateral hypokinesis, mild global hypokinesis. Grade 1 diastolic dysfunction. Calculated EF 40%. Left atrial cavity is mildly dilated at 4.2 cm. IVC is dilated with poor inspiration collapse consistent with elevated right atrial pressure. )       Anesthesia Quick Evaluation

## 2019-05-18 NOTE — Progress Notes (Signed)
   05/18/19 1533  OBSTRUCTIVE SLEEP APNEA  Have you ever been diagnosed with sleep apnea through a sleep study? No  Do you snore loudly (loud enough to be heard through closed doors)?  0  Do you often feel tired, fatigued, or sleepy during the daytime (such as falling asleep during driving or talking to someone)? 0  Has anyone observed you stop breathing during your sleep? 0  Do you have, or are you being treated for high blood pressure? 1  BMI more than 35 kg/m2? 1  Age > 50 (1-yes) 1  Neck circumference greater than:Male 16 inches or larger, Male 17inches or larger? 1  Male Gender (Yes=1) 1  Obstructive Sleep Apnea Score 5

## 2019-05-19 ENCOUNTER — Ambulatory Visit (HOSPITAL_COMMUNITY): Payer: BC Managed Care – PPO | Admitting: Physician Assistant

## 2019-05-19 ENCOUNTER — Ambulatory Visit (HOSPITAL_COMMUNITY)
Admission: RE | Admit: 2019-05-19 | Discharge: 2019-05-19 | Disposition: A | Discharge status: Home / Self Care | Payer: BC Managed Care – PPO | Attending: Orthopedic Surgery | Admitting: Orthopedic Surgery

## 2019-05-19 ENCOUNTER — Telehealth: Payer: Self-pay

## 2019-05-19 ENCOUNTER — Other Ambulatory Visit: Payer: Self-pay

## 2019-05-19 ENCOUNTER — Encounter (HOSPITAL_COMMUNITY): Payer: Self-pay | Admitting: *Deleted

## 2019-05-19 ENCOUNTER — Encounter (HOSPITAL_COMMUNITY)
Admission: RE | Disposition: A | Discharge status: Home / Self Care | Payer: Self-pay | Source: Home / Self Care | Attending: Orthopedic Surgery

## 2019-05-19 DIAGNOSIS — I10 Essential (primary) hypertension: Secondary | ICD-10-CM | POA: Insufficient documentation

## 2019-05-19 DIAGNOSIS — L97519 Non-pressure chronic ulcer of other part of right foot with unspecified severity: Secondary | ICD-10-CM | POA: Diagnosis not present

## 2019-05-19 DIAGNOSIS — E1169 Type 2 diabetes mellitus with other specified complication: Secondary | ICD-10-CM | POA: Diagnosis not present

## 2019-05-19 DIAGNOSIS — I252 Old myocardial infarction: Secondary | ICD-10-CM | POA: Insufficient documentation

## 2019-05-19 DIAGNOSIS — Z7902 Long term (current) use of antithrombotics/antiplatelets: Secondary | ICD-10-CM | POA: Diagnosis not present

## 2019-05-19 DIAGNOSIS — Z7982 Long term (current) use of aspirin: Secondary | ICD-10-CM | POA: Diagnosis not present

## 2019-05-19 DIAGNOSIS — Z794 Long term (current) use of insulin: Secondary | ICD-10-CM | POA: Diagnosis not present

## 2019-05-19 DIAGNOSIS — Z6841 Body Mass Index (BMI) 40.0 and over, adult: Secondary | ICD-10-CM | POA: Insufficient documentation

## 2019-05-19 DIAGNOSIS — Z87891 Personal history of nicotine dependence: Secondary | ICD-10-CM | POA: Insufficient documentation

## 2019-05-19 DIAGNOSIS — Z95820 Peripheral vascular angioplasty status with implants and grafts: Secondary | ICD-10-CM | POA: Diagnosis not present

## 2019-05-19 DIAGNOSIS — N289 Disorder of kidney and ureter, unspecified: Secondary | ICD-10-CM | POA: Diagnosis not present

## 2019-05-19 DIAGNOSIS — Z1159 Encounter for screening for other viral diseases: Secondary | ICD-10-CM | POA: Diagnosis not present

## 2019-05-19 DIAGNOSIS — Z79899 Other long term (current) drug therapy: Secondary | ICD-10-CM | POA: Diagnosis not present

## 2019-05-19 DIAGNOSIS — I2510 Atherosclerotic heart disease of native coronary artery without angina pectoris: Secondary | ICD-10-CM | POA: Diagnosis not present

## 2019-05-19 DIAGNOSIS — E11621 Type 2 diabetes mellitus with foot ulcer: Secondary | ICD-10-CM | POA: Insufficient documentation

## 2019-05-19 DIAGNOSIS — M869 Osteomyelitis, unspecified: Secondary | ICD-10-CM

## 2019-05-19 DIAGNOSIS — E1151 Type 2 diabetes mellitus with diabetic peripheral angiopathy without gangrene: Secondary | ICD-10-CM | POA: Insufficient documentation

## 2019-05-19 DIAGNOSIS — M86271 Subacute osteomyelitis, right ankle and foot: Secondary | ICD-10-CM | POA: Diagnosis not present

## 2019-05-19 DIAGNOSIS — Z89421 Acquired absence of other right toe(s): Secondary | ICD-10-CM

## 2019-05-19 DIAGNOSIS — E1121 Type 2 diabetes mellitus with diabetic nephropathy: Secondary | ICD-10-CM | POA: Diagnosis not present

## 2019-05-19 HISTORY — DX: Chronic kidney disease, unspecified: N18.9

## 2019-05-19 HISTORY — DX: Presence of spectacles and contact lenses: Z97.3

## 2019-05-19 HISTORY — DX: Osteomyelitis, unspecified: M86.9

## 2019-05-19 HISTORY — DX: Atherosclerotic heart disease of native coronary artery without angina pectoris: I25.10

## 2019-05-19 HISTORY — DX: Pneumonia, unspecified organism: J18.9

## 2019-05-19 HISTORY — PX: AMPUTATION: SHX166

## 2019-05-19 LAB — CBC
HCT: 43 % (ref 39.0–52.0)
Hemoglobin: 14.3 g/dL (ref 13.0–17.0)
MCH: 30.7 pg (ref 26.0–34.0)
MCHC: 33.3 g/dL (ref 30.0–36.0)
MCV: 92.3 fL (ref 80.0–100.0)
Platelets: 186 10*3/uL (ref 150–400)
RBC: 4.66 MIL/uL (ref 4.22–5.81)
RDW: 12.9 % (ref 11.5–15.5)
WBC: 8.5 10*3/uL (ref 4.0–10.5)
nRBC: 0 % (ref 0.0–0.2)

## 2019-05-19 LAB — BASIC METABOLIC PANEL
Anion gap: 11 (ref 5–15)
BUN: 35 mg/dL — ABNORMAL HIGH (ref 8–23)
CO2: 19 mmol/L — ABNORMAL LOW (ref 22–32)
Calcium: 8.5 mg/dL — ABNORMAL LOW (ref 8.9–10.3)
Chloride: 107 mmol/L (ref 98–111)
Creatinine, Ser: 1.27 mg/dL — ABNORMAL HIGH (ref 0.61–1.24)
GFR calc Af Amer: >60 mL/min (ref >60)
GFR calc non Af Amer: 60 mL/min — ABNORMAL LOW (ref >60)
Glucose, Bld: 112 mg/dL — ABNORMAL HIGH (ref 70–99)
Potassium: 4.4 mmol/L (ref 3.5–5.1)
Sodium: 137 mmol/L (ref 135–145)

## 2019-05-19 LAB — GLUCOSE, CAPILLARY
Glucose-Capillary: 130 mg/dL — ABNORMAL HIGH (ref 70–99)
Glucose-Capillary: 93 mg/dL (ref 70–99)
Glucose-Capillary: 82 mg/dL (ref 70–99)

## 2019-05-19 LAB — SURGICAL PCR SCREEN
MRSA, PCR: NEGATIVE
Staphylococcus aureus: NEGATIVE

## 2019-05-19 LAB — SARS CORONAVIRUS 2 BY RT PCR (HOSPITAL ORDER, PERFORMED IN ~~LOC~~ HOSPITAL LAB): SARS Coronavirus 2: NEGATIVE

## 2019-05-19 SURGERY — AMPUTATION, FOOT, RAY
Anesthesia: General | Site: Foot | Laterality: Right

## 2019-05-19 MED ORDER — OXYCODONE HCL 5 MG/5ML PO SOLN: 5.0000 mg | Freq: Once | ORAL | Status: DC | PRN

## 2019-05-19 MED ORDER — PROPOFOL 10 MG/ML IV BOLUS
INTRAVENOUS | Status: AC
  Filled 2019-05-19: qty 20

## 2019-05-19 MED ORDER — DEXAMETHASONE SODIUM PHOSPHATE 10 MG/ML IJ SOLN
INTRAMUSCULAR | Status: DC | PRN
  Administered 2019-05-19: 5 mg via INTRAVENOUS

## 2019-05-19 MED ORDER — FENTANYL CITRATE (PF) 100 MCG/2ML IJ SOLN
INTRAMUSCULAR | Status: AC
  Filled 2019-05-19: qty 2

## 2019-05-19 MED ORDER — LIDOCAINE 2% (20 MG/ML) 5 ML SYRINGE
INTRAMUSCULAR | Status: DC | PRN
  Administered 2019-05-19: 100 mg via INTRAVENOUS

## 2019-05-19 MED ORDER — PHENYLEPHRINE HCL (PRESSORS) 10 MG/ML IV SOLN
INTRAVENOUS | Status: DC | PRN
  Administered 2019-05-19: 120 ug via INTRAVENOUS
  Administered 2019-05-19 (×2): 80 ug via INTRAVENOUS

## 2019-05-19 MED ORDER — MIDAZOLAM HCL 2 MG/2ML IJ SOLN
INTRAMUSCULAR | Status: DC | PRN
  Administered 2019-05-19: 2 mg via INTRAVENOUS

## 2019-05-19 MED ORDER — FENTANYL CITRATE (PF) 250 MCG/5ML IJ SOLN
INTRAMUSCULAR | Status: AC
  Filled 2019-05-19: qty 5

## 2019-05-19 MED ORDER — FENTANYL CITRATE (PF) 250 MCG/5ML IJ SOLN
INTRAMUSCULAR | Status: DC | PRN
  Administered 2019-05-19: 50 ug via INTRAVENOUS

## 2019-05-19 MED ORDER — POVIDONE-IODINE 10 % EX SWAB: 2.0000 "application " | Freq: Once | CUTANEOUS | Status: DC

## 2019-05-19 MED ORDER — LACTATED RINGERS IV SOLN
INTRAVENOUS | Status: DC
  Administered 2019-05-19: 10:00:00 via INTRAVENOUS

## 2019-05-19 MED ORDER — CHLORHEXIDINE GLUCONATE 4 % EX LIQD: 60.0000 mL | Freq: Once | CUTANEOUS | Status: DC

## 2019-05-19 MED ORDER — ONDANSETRON HCL 4 MG/2ML IJ SOLN
INTRAMUSCULAR | Status: DC | PRN
  Administered 2019-05-19: 4 mg via INTRAVENOUS

## 2019-05-19 MED ORDER — HYDROMORPHONE HCL 1 MG/ML IJ SOLN: 0.2500 mg | INTRAMUSCULAR | Status: DC | PRN

## 2019-05-19 MED ORDER — 0.9 % SODIUM CHLORIDE (POUR BTL) OPTIME
TOPICAL | Status: DC | PRN
  Administered 2019-05-19: 12:00:00 1000 mL

## 2019-05-19 MED ORDER — PROMETHAZINE HCL 25 MG/ML IJ SOLN: 6.2500 mg | INTRAMUSCULAR | Status: DC | PRN

## 2019-05-19 MED ORDER — DEXTROSE 5 % IV SOLN
3.0000 g | INTRAVENOUS | Status: AC
  Administered 2019-05-19: 3 g via INTRAVENOUS
  Filled 2019-05-19: qty 3

## 2019-05-19 MED ORDER — OXYCODONE-ACETAMINOPHEN 5-325 MG PO TABS: 1.0000 | ORAL_TABLET | Freq: Four times a day (QID) | ORAL | 0 refills | Status: DC | PRN

## 2019-05-19 MED ORDER — OXYCODONE HCL 5 MG PO TABS: 5.0000 mg | ORAL_TABLET | Freq: Once | ORAL | Status: DC | PRN

## 2019-05-19 MED ORDER — MIDAZOLAM HCL 2 MG/2ML IJ SOLN
INTRAMUSCULAR | Status: AC
  Filled 2019-05-19: qty 2

## 2019-05-19 MED ORDER — ENSURE PRE-SURGERY PO LIQD
296.0000 mL | Freq: Once | ORAL | Status: DC
  Filled 2019-05-19: qty 296

## 2019-05-19 MED ORDER — EPHEDRINE SULFATE-NACL 50-0.9 MG/10ML-% IV SOSY
PREFILLED_SYRINGE | INTRAVENOUS | Status: DC | PRN
  Administered 2019-05-19 (×2): 10 mg via INTRAVENOUS

## 2019-05-19 MED ORDER — PROPOFOL 10 MG/ML IV BOLUS
INTRAVENOUS | Status: DC | PRN
  Administered 2019-05-19: 190 mg via INTRAVENOUS

## 2019-05-19 SURGICAL SUPPLY — 33 items
BLADE SAW SGTL MED 73X18.5 STR (BLADE) IMPLANT
BLADE SURG 21 STRL SS (BLADE) ×3 IMPLANT
BNDG COHESIVE 4X5 TAN STRL (GAUZE/BANDAGES/DRESSINGS) ×3 IMPLANT
BNDG GAUZE ELAST 4 BULKY (GAUZE/BANDAGES/DRESSINGS) ×3 IMPLANT
COVER SURGICAL LIGHT HANDLE (MISCELLANEOUS) ×3 IMPLANT
COVER WAND RF STERILE (DRAPES) ×3 IMPLANT
DRAPE U-SHAPE 47X51 STRL (DRAPES) ×6 IMPLANT
DRSG ADAPTIC 3X8 NADH LF (GAUZE/BANDAGES/DRESSINGS) ×3 IMPLANT
DRSG PAD ABDOMINAL 8X10 ST (GAUZE/BANDAGES/DRESSINGS) ×6 IMPLANT
DURAPREP 26ML APPLICATOR (WOUND CARE) ×3 IMPLANT
ELECT REM PT RETURN 9FT ADLT (ELECTROSURGICAL) ×3
ELECTRODE REM PT RTRN 9FT ADLT (ELECTROSURGICAL) ×1 IMPLANT
GAUZE SPONGE 4X4 12PLY STRL (GAUZE/BANDAGES/DRESSINGS) IMPLANT
GAUZE SPONGE 4X4 12PLY STRL LF (GAUZE/BANDAGES/DRESSINGS) ×3 IMPLANT
GLOVE BIO SURGEON STRL SZ 6.5 (GLOVE) ×4 IMPLANT
GLOVE BIO SURGEONS STRL SZ 6.5 (GLOVE) ×2
GLOVE BIOGEL PI IND STRL 9 (GLOVE) ×1 IMPLANT
GLOVE BIOGEL PI INDICATOR 9 (GLOVE) ×2
GLOVE SURG ORTHO 9.0 STRL STRW (GLOVE) ×3 IMPLANT
GOWN STRL REUS W/ TWL XL LVL3 (GOWN DISPOSABLE) ×2 IMPLANT
GOWN STRL REUS W/TWL XL LVL3 (GOWN DISPOSABLE) ×4
KIT BASIN OR (CUSTOM PROCEDURE TRAY) ×3 IMPLANT
KIT TURNOVER KIT B (KITS) ×3 IMPLANT
NS IRRIG 1000ML POUR BTL (IV SOLUTION) ×3 IMPLANT
PACK ORTHO EXTREMITY (CUSTOM PROCEDURE TRAY) ×3 IMPLANT
PAD ABD 8X10 STRL (GAUZE/BANDAGES/DRESSINGS) ×3 IMPLANT
PAD ARMBOARD 7.5X6 YLW CONV (MISCELLANEOUS) IMPLANT
STOCKINETTE IMPERVIOUS LG (DRAPES) IMPLANT
SUT ETHILON 2 0 PSLX (SUTURE) ×6 IMPLANT
TOWEL GREEN STERILE (TOWEL DISPOSABLE) ×3 IMPLANT
TUBE CONNECTING 12'X1/4 (SUCTIONS) ×1
TUBE CONNECTING 12X1/4 (SUCTIONS) ×2 IMPLANT
YANKAUER SUCT BULB TIP NO VENT (SUCTIONS) ×3 IMPLANT

## 2019-05-19 NOTE — Transfer of Care (Signed)
Immediate Anesthesia Transfer of Care Note  Patient: Eduardo [name]  Procedure(s) Performed: RIGHT FOOT 5TH RAY AMPUTATION (Right Foot)  Patient Location: PACU  Anesthesia Type:General  Level of Consciousness: awake, alert  and oriented  Airway & Oxygen Therapy: Patient Spontanous Breathing and Patient connected to face mask oxygen  Post-op Assessment: Report given to RN and Post -op Vital signs reviewed and stable  Post vital signs: Reviewed and stable  Last Vitals:  Vitals Value Taken Time  BP 136/70   Temp    Pulse 72   Resp 16   SpO2 97     Last Pain:  Vitals:   05/19/19 0952  TempSrc:   PainSc: 0-No pain      Patients Stated Pain Goal: 7 (96/88/64 8472)  Complications: No apparent anesthesia complications

## 2019-05-19 NOTE — Op Note (Signed)
05/19/2019  1:13 PM  PATIENT:  Eduardo [name]    PRE-OPERATIVE DIAGNOSIS:  Osteomyelitis Right Foot  POST-OPERATIVE DIAGNOSIS:  Same  PROCEDURE:  RIGHT FOOT 5TH RAY AMPUTATION With local tissue rearrangement for wound closure 4 x 9 cm.   SURGEON:  Newt Minion, MD  PHYSICIAN ASSISTANT:None ANESTHESIA:   General  PREOPERATIVE INDICATIONS:  [name] is a  63 y.o. male with a diagnosis of Osteomyelitis Right Foot who failed conservative measures and elected for surgical management.    The risks benefits and alternatives were discussed with the patient preoperatively including but not limited to the risks of infection, bleeding, nerve injury, cardiopulmonary complications, the need for revision surgery, among others, and the patient was willing to proceed.  OPERATIVE IMPLANTS: None  @ENCIMAGES @  OPERATIVE FINDINGS: No deep abscess good petechial bleeding.  OPERATIVE PROCEDURE: Patient was brought the operating room and underwent a general anesthetic.  After adequate levels anesthesia were obtained patient's right lower extremity was prepped using DuraPrep draped into a sterile field a timeout was called.  He received 3 g of Kefzol preoperatively.  A racquet incision was made to include the ulcerative tissue and the fifth ray.  The ray was resected through the shaft beveled plantarly.  This left a wound that was 4 x 9 cm.  Electrocautery was used for hemostasis the wound was irrigated with normal saline.  Local tissue rearrangement was used to close the wound 4 x 9 cm.  2-0 nylon was used for wound closure.  Sterile dressing was applied patient was extubated taken the PACU in stable condition.   DISCHARGE PLANNING:  Antibiotic duration: Preoperative antibiotics no signs of abscess at the surgical margins  Weightbearing: Touchdown weightbearing on the right with postoperative shoe  Pain medication: Prescription for Percocet  Dressing care/ Wound VAC: Follow-up in the office in 1  week to change the dressing  Ambulatory devices: Walker or crutches  Discharge to: Home.  Follow-up: In the office 1 week post operative.

## 2019-05-19 NOTE — H&P (Signed)
Eduardo [name] is an 63 y.o. male.   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  He has undergone stent placement for revascularization to the right lower extremity but had a persistent wound and drainage over the fifth metatarsal head ulcer and presents for right foot fifth ray amputation today.  Past Medical History:  Diagnosis Date  . Chronic kidney disease    stage 3  . Colon polyp   . Coronary artery disease   . Diabetes mellitus 1987   under care of Dr. Chalmers Cater.  On insulin since 96 (off and on)  . Diabetic retinopathy   . Dupuytren contracture    R hand, s/p injection (Dr. Lenon Curt)  . Essential hypertension, benign   . Essential hypertension, benign 02/06/2019  . Frequency of urination and polyuria   . Hypertension   . Myocardial infarction [facility])    denies  . Neuromuscular disorder ([place])    Diabetic neuropathy  . Osteomyelitis ([place])    right foot  . Other testicular hypofunction   . Peripheral arterial disease ([place]) 10/28/2012  . Peritoneal abscess ([place]) 6/08   and buttock.  . Pneumonia   . Polydipsia   . Proteinuria   . Pure hyperglyceridemia   . Wears glasses     Past Surgical History:  Procedure Laterality Date  . ABDOMINAL AORTAGRAM N/A 04/18/2012   Procedure: ABDOMINAL Maxcine Ham;  Surgeon: Angelia Mould, MD;  Location: [facility] CATH LAB;  Service: Cardiovascular;  Laterality: N/A;  . CARDIAC CATHETERIZATION N/A 09/08/2016   Procedure: Left Heart Cath and Coronary Angiography;  Surgeon: Adrian Prows, MD;  Location: [place] CV LAB;  Service: Cardiovascular;  Laterality: N/A;  . CATARACT EXTRACTION, BILATERAL  09/2017, 10/2017   Dr. Herbert Deaner  . COLONOSCOPY W/ BIOPSIES AND POLYPECTOMY    . LOWER EXTREMITY ANGIOGRAM Bilateral  04/18/2012   Procedure: LOWER EXTREMITY ANGIOGRAM;  Surgeon: Angelia Mould, MD;  Location: [facility] CATH LAB;  Service: Cardiovascular;  Laterality: Bilateral;  bilat lower extrem angio  . LOWER EXTREMITY ANGIOGRAPHY Bilateral 05/02/2019   Procedure: LOWER EXTREMITY ANGIOGRAPHY;  Surgeon: Adrian Prows, MD;  Location: [place] CV LAB;  Service: Cardiovascular;  Laterality: Bilateral;  . LOWER EXTREMITY ANGIOGRAPHY Bilateral 02/28/2019   Procedure: LOWER EXTREMITY ANGIOGRAPHY;  Surgeon: Adrian Prows, MD;  Location: [place] CV LAB;  Service: Cardiovascular;  Laterality: Bilateral;  . macular photocoagulation     (eye treatments for diabetic retinopathy)-Dr. Zigmund Daniel  . PERIPHERAL VASCULAR INTERVENTION  02/28/2019   Procedure: PERIPHERAL VASCULAR INTERVENTION;  Surgeon: Adrian Prows, MD;  Location: [place] CV LAB;  Service: Cardiovascular;;    Family History  Problem Relation Age of Onset  . Diabetes Mother   . Hearing loss Mother   . Hypertension Mother   . Hyperlipidemia Mother   . Heart disease Mother   . Varicose Veins Mother   . Varicose Veins Father   . Dementia Father   . Hyperlipidemia Brother   . Diabetes Maternal Grandmother    Social History:  reports that he quit smoking about 7 years ago. His smoking use included cigarettes. He has a 30.00 pack-year smoking history. He has never used smokeless tobacco. He reports current alcohol use. He reports that he does not use drugs.  Allergies:  Allergies  Allergen  Reactions  . Food     Mussels=nausea/vomiting    No medications prior to admission.    No results found for this or any previous visit (from the past 48 hour(s)). No results found.  Review of Systems  All other systems reviewed and are negative.   There were no vitals taken for this visit. Physical Exam  Constitutional: He is oriented to person, place, and time. He appears well-developed and well-nourished. No distress.  HENT:  Head: Normocephalic and  atraumatic.  Neck: No tracheal deviation present. No thyromegaly present.  Cardiovascular: Normal rate.  Respiratory: Effort normal. No stridor. No respiratory distress.  GI: Soft. He exhibits no distension.  Musculoskeletal:     Comments: Examination patient has a palpable dorsalis pedis and posterior tibial pulse.  He has a biphasic Doppler dorsalis pedis and posterior tibial pulse.  The has had ankle-brachial indices obtained with cardiology and by report has good revascularization.  He has a ulcer that is 2 cm in diameter over the fifth metatarsal head that probes to bone.    Neurological: He is alert and oriented to person, place, and time. No cranial nerve deficit. Coordination normal.  Skin: Skin is warm.  Psychiatric: He has a normal mood and affect. His behavior is normal. Judgment and thought content normal.     Assessment/Plan Right foot osteomyelitis-plan right foot fifth ray amputation- the procedure and possible benefits and risks including the risks of bleeding, infection, neurovascular injury, possible need for further surgery were discussed with the patient and his questions answered to his satisfaction.  The patient wishes to proceed with surgery at this time.  Erlinda Hong, PA-C 05/19/2019, 7:38 AM [facility] [phone]

## 2019-05-19 NOTE — Discharge Instructions (Addendum)
Touch down weight bearing on right foot

## 2019-05-19 NOTE — Telephone Encounter (Signed)
Patient had left a VM concerning bleeding coming through his bandage. Patient had 5th Ray Amputation with Dr. Sharol Given, 05/19/2019.  Talked with Dr. Sharol Given and he advised for patient to reinforce it, but not to tight.  Stated that this is 100% expected and that he could remove the bandage on Saturday, 05/20/2019 and to come into the office on Monday, 05/22/2019. Advised patient of message per Dr. Sharol Given, patient stated that he understands and has an appt.for Monday, 05/22/2019 with Shawn.

## 2019-05-19 NOTE — Anesthesia Procedure Notes (Signed)
Procedure Name: LMA Insertion Date/Time: 05/19/2019 12:37 PM Performed by: Bryson Corona, CRNA Pre-anesthesia Checklist: Patient identified, Emergency Drugs available, Suction available and Patient being monitored Patient Re-evaluated:Patient Re-evaluated prior to induction Oxygen Delivery Method: Circle System Utilized Preoxygenation: Pre-oxygenation with 100% oxygen Induction Type: IV induction LMA: LMA inserted LMA Size: 4.0 Number of attempts: 1 Placement Confirmation: positive ETCO2 Tube secured with: Tape Dental Injury: Teeth and Oropharynx as per pre-operative assessment

## 2019-05-20 NOTE — Anesthesia Postprocedure Evaluation (Signed)
Anesthesia Post Note  Patient: Eduardo [name]  Procedure(s) Performed: RIGHT FOOT 5TH RAY AMPUTATION (Right Foot)     Patient location during evaluation: PACU Anesthesia Type: General Level of consciousness: awake and alert Pain management: pain level controlled Vital Signs Assessment: post-procedure vital signs reviewed and stable Respiratory status: spontaneous breathing, nonlabored ventilation, respiratory function stable and patient connected to nasal cannula oxygen Cardiovascular status: blood pressure returned to baseline and stable Postop Assessment: no apparent nausea or vomiting Anesthetic complications: no    Last Vitals:  Vitals:   05/19/19 1330 05/19/19 1345  BP: 123/72 105/62  Pulse: 65 62  Resp: 18 11  Temp:  (!) 36.4 C  SpO2: 90% 97%    Last Pain:  Vitals:   05/19/19 1345  TempSrc:   PainSc: 3                  Ara Mano S

## 2019-05-21 ENCOUNTER — Encounter (HOSPITAL_COMMUNITY): Payer: Self-pay | Admitting: Orthopedic Surgery

## 2019-05-22 ENCOUNTER — Encounter: Payer: Self-pay | Admitting: Physician Assistant

## 2019-05-22 ENCOUNTER — Other Ambulatory Visit: Payer: Self-pay

## 2019-05-22 ENCOUNTER — Ambulatory Visit (INDEPENDENT_AMBULATORY_CARE_PROVIDER_SITE_OTHER): Payer: BC Managed Care – PPO | Admitting: Physician Assistant

## 2019-05-22 VITALS — Ht 70.0 in | Wt 283.0 lb

## 2019-05-22 DIAGNOSIS — E1142 Type 2 diabetes mellitus with diabetic polyneuropathy: Secondary | ICD-10-CM

## 2019-05-22 DIAGNOSIS — N183 Chronic kidney disease, stage 3 unspecified: Secondary | ICD-10-CM

## 2019-05-22 DIAGNOSIS — Z794 Long term (current) use of insulin: Secondary | ICD-10-CM

## 2019-05-22 DIAGNOSIS — I739 Peripheral vascular disease, unspecified: Secondary | ICD-10-CM

## 2019-05-22 DIAGNOSIS — Z89421 Acquired absence of other right toe(s): Secondary | ICD-10-CM

## 2019-05-22 MED ORDER — OXYCODONE-ACETAMINOPHEN 5-325 MG PO TABS: 1.0000 | ORAL_TABLET | Freq: Four times a day (QID) | ORAL | 0 refills | Status: DC | PRN

## 2019-05-22 NOTE — Progress Notes (Signed)
Office Visit Note   Patient: Eduardo [name]           Date of Birth: 01-Jun-1956           MRN: [id number] Visit Date: 05/22/2019              Requested by: Rita Ohara, [address] [place],  [place] [address] PCP: Rita Ohara, MD  Chief Complaint  Patient presents with  . Right Foot - Routine Post Op    05/19/2019 right foot 5th ray  1 week post/op       HPI: The patient is a 63 year old gentleman with diabetic insensate polyneuropathy who is seen for postoperative follow-up following a right foot fifth ray amputation on 05/19/2019.  Since surgery he reports that he has been bleeding through the bandages.  He is requiring pain medication on a regular basis.  He reports he is on Plavix and aspirin chronically.  He is also on amoxicillin and was instructed to continue this postoperatively for 2 days.  Assessment & Plan: Visit Diagnoses:  1. 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 Type 2 diabetes mellitus with diabetic polyneuropathy, with long-term current use of insulin ([place])   3. CKD (chronic kidney disease) stage 3, GFR 30-59 ml/min (HCC)   4. PAD (peripheral artery disease) ([place])     Plan: He had a couple of small bleeders on the skin and this was treated with silver nitrate and his bleeding was resolved.  He was instructed to begin dry gauze dressings to the incisional area with Ace wrapping daily and to elevate and offload the foot as much as possible.  He is going to continue his amoxicillin which he was on prior to surgery until we see him back on Friday.Marland Kitchen  Percocet was refilled.  He will follow-up on Friday.  Follow-Up Instructions: Return in about 4 days (around 05/26/2019).   Ortho Exam  Patient is alert, oriented, no adenopathy, well-dressed, normal affect, normal respiratory effort. The right foot lateral incision had several small bleeders from the skin edges.  These areas were treated with silver nitrate and the bleeding was  resolved.  The sutures are under mild tension with moderate edema noted of the foot and some mild erythema.  He has palpable pedal pulses.  Discussed with the patient that he does need to elevate and offload the foot is much as possible that this is contributing to some continued bleeding over the foot.  He was instructed to place dry gauze dressings to the foot daily after washing with soap and water and Ace wrapping for edema control.  Imaging: No results found.   Labs: Lab Results  Component Value Date   HGBA1C 7.2 04/14/2019   HGBA1C 7.2 06/09/2018   HGBA1C 7.1% 05/24/2017   REPTSTATUS 05/02/2019 FINAL 04/28/2019   GRAMSTAIN  04/28/2019    NO WBC SEEN NO ORGANISMS SEEN Performed at [facility], [place] [address], [place], [place] [address]    CULT RARE ENTEROCOCCUS FAECALIS 04/28/2019   LABORGA ENTEROCOCCUS FAECALIS 04/28/2019     Lab Results  Component Value Date   ALBUMIN 3.8 04/21/2019   ALBUMIN 4.1 09/28/2011    No results found for: MG No results found for: VD25OH  No results found for: PREALBUMIN CBC EXTENDED Latest Ref Rng & Units 05/19/2019 04/21/2019 02/23/2019  WBC 4.0 - 10.5 K/uL 8.5 7.7 7.9  RBC 4.22 - 5.81 MIL/uL 4.66 4.85 5.05  HGB 13.0 -  17.0 g/dL 14.3 15.3 15.8  HCT 39.0 - 52.0 % 43.0 44.9 45.9  PLT 150 - 400 K/uL 186 198 211  NEUTROABS 1.7 - 7.7 K/uL - - -  LYMPHSABS 0.7 - 4.0 K/uL - - -     Body mass index is 40.61 kg/m.  Orders:  No orders of the defined types were placed in this encounter.  No orders of the defined types were placed in this encounter.    Procedures: No procedures performed  Clinical Data: No additional findings.  ROS:  All other systems negative, except as noted in the HPI. Review of Systems  Objective: Vital Signs: Ht 5\' 10"  (1.778 m)   Wt 283 lb (128.4 kg)   BMI 40.61 kg/m   Specialty Comments:  No specialty comments available.  PMFS History: Patient Active Problem List   Diagnosis Date Noted  .  Subacute osteomyelitis, right ankle and foot ([place])   . Ischemic ulcer of lower leg due to atherosclerosis ([place]) 02/28/2019  . Essential hypertension, benign 02/06/2019  . Hypercholesteremia 02/06/2019  . CKD (chronic kidney disease) stage 3, GFR 30-59 ml/min (HCC) 10/06/2017  . Proteinuria 10/06/2017  . Type 2 diabetes mellitus with nephropathy ([place]) 10/06/2017  . 3-vessel coronary artery disease 05/23/2017  . Abnormal nuclear stress test 09/06/2016  . PAD (peripheral artery disease) ([place]) 10/28/2012  . Morbid obesity with BMI of 40.0-44.9, adult ([place]) 10/13/2012  . Diabetes mellitus with ophthalmic complication ([place]) 24/23/5361  . Hypertension associated with diabetes ([place]) 10/13/2012  . Atherosclerosis of native artery of extremity with intermittent claudication ([place]) 04/05/2012  . Adenomatous colon polyp 09/28/2011  . Pure hyperglyceridemia 09/28/2011  . Erectile dysfunction 09/28/2011  . Diabetic retinopathy ([place]) 09/28/2011   Past Medical History:  Diagnosis Date  . Chronic kidney disease    stage 3  . Colon polyp   . Coronary artery disease   . Diabetes mellitus 1987   under care of Dr. Chalmers Cater.  On insulin since 96 (off and on)  . Diabetic retinopathy   . Dupuytren contracture    R hand, s/p injection (Dr. Lenon Curt)  . Essential hypertension, benign   . Essential hypertension, benign 02/06/2019  . Frequency of urination and polyuria   . Hypertension   . Myocardial infarction [facility])    denies  . Neuromuscular disorder ([place])    Diabetic neuropathy  . Osteomyelitis ([place])    right foot  . Other testicular hypofunction   . Peripheral arterial disease ([place]) 10/28/2012  . Peritoneal abscess ([place]) 6/08   and buttock.  . Pneumonia   . Polydipsia   . Proteinuria   . Pure hyperglyceridemia   . Wears glasses     Family History  Problem Relation Age of Onset  . Diabetes Mother   . Hearing loss Mother   . Hypertension Mother   . Hyperlipidemia Mother   . Heart disease Mother    . Varicose Veins Mother   . Varicose Veins Father   . Dementia Father   . Hyperlipidemia Brother   . Diabetes Maternal Grandmother     Past Surgical History:  Procedure Laterality Date  . ABDOMINAL AORTAGRAM N/A 04/18/2012   Procedure: ABDOMINAL Maxcine Ham;  Surgeon: Angelia Mould, MD;  [facility] C[facility] LAB;  Service: Cardiovascular;  Laterality: N/A;  . AMPUTATION Right 05/19/2019   Procedure: RIGHT FOOT 5TH RAY AMPUTATION;  Surgeon: Newt Minion, MD;  Location: [place];  Service: Orthopedics;  Laterality: Right;  . CARDIAC CATHETERIZATION N/A 09/08/2016   Procedure:  Left Heart Cath and Coronary Angiography;  Surgeon: Adrian Prows, MD;  Location: [place] CV LAB;  Service: Cardiovascular;  Laterality: N/A;  . CATARACT EXTRACTION, BILATERAL  09/2017, 10/2017   Dr. Herbert Deaner  . COLONOSCOPY W/ BIOPSIES AND POLYPECTOMY    . LOWER EXTREMITY ANGIOGRAM Bilateral 04/18/2012   Procedure: LOWER EXTREMITY ANGIOGRAM;  Surgeon: Angelia Mould, MD;  Location: [facility] CATH LAB;  Service: Cardiovascular;  Laterality: Bilateral;  bilat lower extrem angio  . LOWER EXTREMITY ANGIOGRAPHY Bilateral 05/02/2019   Procedure: LOWER EXTREMITY ANGIOGRAPHY;  Surgeon: Adrian Prows, MD;  Location: [place] CV LAB;  Service: Cardiovascular;  Laterality: Bilateral;  . LOWER EXTREMITY ANGIOGRAPHY Bilateral 02/28/2019   Procedure: LOWER EXTREMITY ANGIOGRAPHY;  Surgeon: Adrian Prows, MD;  Location: [place] CV LAB;  Service: Cardiovascular;  Laterality: Bilateral;  . macular photocoagulation     (eye treatments for diabetic retinopathy)-Dr. Zigmund Daniel  . PERIPHERAL VASCULAR INTERVENTION  02/28/2019   Procedure: PERIPHERAL VASCULAR INTERVENTION;  Surgeon: Adrian Prows, MD;  Location: [place] CV LAB;  Service: Cardiovascular;;   Social History   Occupational History  . 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 Smoking status: Former Smoker     Packs/day: 1.00    Years: 30.00    Pack years: 30.00    Types: Cigarettes    Quit date: 01/08/2012    Years since quitting: 7.3  . Smokeless tobacco: Never Used  Substance and Sexual Activity  . Alcohol use: Yes    Comment: rare  . Drug use: No  . Sexual activity: Not Currently

## 2019-05-26 ENCOUNTER — Encounter: Payer: Self-pay | Admitting: Physician Assistant

## 2019-05-26 ENCOUNTER — Ambulatory Visit (INDEPENDENT_AMBULATORY_CARE_PROVIDER_SITE_OTHER): Payer: BC Managed Care – PPO | Admitting: Physician Assistant

## 2019-05-26 VITALS — Ht 70.0 in | Wt 283.0 lb

## 2019-05-26 DIAGNOSIS — L97516 Non-pressure chronic ulcer of other part of right foot with bone involvement without evidence of necrosis: Secondary | ICD-10-CM | POA: Diagnosis not present

## 2019-05-26 DIAGNOSIS — E1142 Type 2 diabetes mellitus with diabetic polyneuropathy: Secondary | ICD-10-CM

## 2019-05-26 DIAGNOSIS — L03115 Cellulitis of right lower limb: Secondary | ICD-10-CM | POA: Diagnosis not present

## 2019-05-26 DIAGNOSIS — E1151 Type 2 diabetes mellitus with diabetic peripheral angiopathy without gangrene: Secondary | ICD-10-CM | POA: Diagnosis not present

## 2019-05-26 DIAGNOSIS — E1169 Type 2 diabetes mellitus with other specified complication: Secondary | ICD-10-CM | POA: Diagnosis not present

## 2019-05-26 DIAGNOSIS — M869 Osteomyelitis, unspecified: Secondary | ICD-10-CM | POA: Diagnosis not present

## 2019-05-26 DIAGNOSIS — N183 Chronic kidney disease, stage 3 unspecified: Secondary | ICD-10-CM

## 2019-05-26 DIAGNOSIS — L97522 Non-pressure chronic ulcer of other part of left foot with fat layer exposed: Secondary | ICD-10-CM | POA: Diagnosis not present

## 2019-05-26 DIAGNOSIS — I10 Essential (primary) hypertension: Secondary | ICD-10-CM | POA: Diagnosis not present

## 2019-05-26 DIAGNOSIS — E11621 Type 2 diabetes mellitus with foot ulcer: Secondary | ICD-10-CM | POA: Diagnosis not present

## 2019-05-26 DIAGNOSIS — Z89421 Acquired absence of other right toe(s): Secondary | ICD-10-CM

## 2019-05-26 DIAGNOSIS — I2510 Atherosclerotic heart disease of native coronary artery without angina pectoris: Secondary | ICD-10-CM | POA: Diagnosis not present

## 2019-05-26 DIAGNOSIS — Z794 Long term (current) use of insulin: Secondary | ICD-10-CM

## 2019-05-26 DIAGNOSIS — I739 Peripheral vascular disease, unspecified: Secondary | ICD-10-CM

## 2019-05-26 DIAGNOSIS — M609 Myositis, unspecified: Secondary | ICD-10-CM | POA: Diagnosis not present

## 2019-05-26 NOTE — Progress Notes (Signed)
Office Visit Note   Patient: Eduardo [name]           Date of Birth: 05-Jun-1956           MRN: [id number] Visit Date: 05/26/2019              Requested by: Rita Ohara, [address] [place],  [place] [address] PCP: Rita Ohara, MD  Chief Complaint  Patient presents with  . Right Foot - Routine Post Op    05/19/2019 right 5th ray amp      HPI: The patient is a 63 yo gentleman who is seen for post operative follow up following a right foot fifth ray amputation on 05/19/2019.  He has diabetic insensate polyneuropathy.  He is on Plavix and aspirin chronically. He has continued amoxicillin postoperatively. He has been elevating and offloading the foot is much as possible and utilizing a Darco shoe.  He reports his pain is gradually improving and he is taking less of the Percocet and just Tylenol at times.  He has been able to get into the shower and did notice some small amount of bleeding from the proximal incision.  Assessment & Plan: Visit Diagnoses:  1. 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 Type 2 diabetes mellitus with diabetic polyneuropathy, with long-term current use of insulin ([place])   3. CKD (chronic kidney disease) stage 3, GFR 30-59 ml/min (HCC)   4. PAD (peripheral artery disease) ([place])     Plan: The patient should continue to elevate and offload the foot as much as possible continue to wash the foot daily with soap and water and apply a dry gauze dressing and Ace wrap and minimize weightbearing.  Continue amoxicillin.  He will follow-up next week for recheck.  Follow-Up Instructions: Return in about 1 week (around 06/02/2019).    Ortho Exam  Patient is alert, oriented, no adenopathy, well-dressed, normal affect, normal respiratory effort. The right lateral fifth ray incision has dried blood and crusting along the incision line.  There is a 1 to 1-1/2 cm area over the proximal incision which has opened up more and is under more  tension.  There is mild edema.  Some mild erythema.  No odor.  No significant drainage except for some slight heme on the dressings.  He has palpable pedal pulses.  Imaging: No results found. No images are attached to the encounter.  Labs: Lab Results  Component Value Date   HGBA1C 7.2 04/14/2019   HGBA1C 7.2 06/09/2018   HGBA1C 7.1% 05/24/2017   REPTSTATUS 05/02/2019 FINAL 04/28/2019   GRAMSTAIN  04/28/2019    NO WBC SEEN NO ORGANISMS SEEN Performed at [facility], [place] [address], [place], [place] [address]    CULT RARE ENTEROCOCCUS FAECALIS 04/28/2019   LABORGA ENTEROCOCCUS FAECALIS 04/28/2019     Lab Results  Component Value Date   ALBUMIN 3.8 04/21/2019   ALBUMIN 4.1 09/28/2011    No results found for: MG No results found for: VD25OH  No results found for: PREALBUMIN CBC EXTENDED Latest Ref Rng & Units 05/19/2019 04/21/2019 02/23/2019  WBC 4.0 - 10.5 K/uL 8.5 7.7 7.9  RBC 4.22 - 5.81 MIL/uL 4.66 4.85 5.05  HGB 13.0 - 17.0 g/dL 14.3 15.3 15.8  HCT 39.0 - 52.0 % 43.0 44.9 45.9  PLT 150 - 400 K/uL 186 198 211  NEUTROABS 1.7 - 7.7 K/uL - - -  LYMPHSABS 0.7 - 4.0 K/uL - - -  Body mass index is 40.61 kg/m.  Orders:  No orders of the defined types were placed in this encounter.  No orders of the defined types were placed in this encounter.    Procedures: No procedures performed  Clinical Data: No additional findings.  ROS:  All other systems negative, except as noted in the HPI. Review of Systems  Objective: Vital Signs: Ht 5\' 10"  (1.778 m)   Wt 283 lb (128.4 kg)   BMI 40.61 kg/m   Specialty Comments:  No specialty comments available.  PMFS History: Patient Active Problem List   Diagnosis Date Noted  . Subacute osteomyelitis, right ankle and foot ([place])   . Ischemic ulcer of lower leg due to atherosclerosis ([place]) 02/28/2019  . Essential hypertension, benign 02/06/2019  . Hypercholesteremia 02/06/2019  . CKD (chronic kidney disease)  stage 3, GFR 30-59 ml/min (HCC) 10/06/2017  . Proteinuria 10/06/2017  . Type 2 diabetes mellitus with nephropathy ([place]) 10/06/2017  . 3-vessel coronary artery disease 05/23/2017  . Abnormal nuclear stress test 09/06/2016  . PAD (peripheral artery disease) ([place]) 10/28/2012  . Morbid obesity with BMI of 40.0-44.9, adult ([place]) 10/13/2012  . Diabetes mellitus with ophthalmic complication ([place]) 77/82/4235  . Hypertension associated with diabetes ([place]) 10/13/2012  . Atherosclerosis of native artery of extremity with intermittent claudication ([place]) 04/05/2012  . Adenomatous colon polyp 09/28/2011  . Pure hyperglyceridemia 09/28/2011  . Erectile dysfunction 09/28/2011  . Diabetic retinopathy ([place]) 09/28/2011   Past Medical History:  Diagnosis Date  . Chronic kidney disease    stage 3  . Colon polyp   . Coronary artery disease   . Diabetes mellitus 1987   under care of Dr. Chalmers Cater.  On insulin since 96 (off and on)  . Diabetic retinopathy   . Dupuytren contracture    R hand, s/p injection (Dr. Lenon Curt)  . Essential hypertension, benign   . Essential hypertension, benign 02/06/2019  . Frequency of urination and polyuria   . Hypertension   . Myocardial infarction [facility])    denies  . Neuromuscular disorder ([place])    Diabetic neuropathy  . Osteomyelitis ([place])    right foot  . Other testicular hypofunction   . Peripheral arterial disease ([place]) 10/28/2012  . Peritoneal abscess ([place]) 6/08   and buttock.  . Pneumonia   . Polydipsia   . Proteinuria   . Pure hyperglyceridemia   . Wears glasses     Family History  Problem Relation Age of Onset  . Diabetes Mother   . Hearing loss Mother   . Hypertension Mother   . Hyperlipidemia Mother   . Heart disease Mother   . Varicose Veins Mother   . Varicose Veins Father   . Dementia Father   . Hyperlipidemia Brother   . Diabetes Maternal Grandmother     Past Surgical History:  Procedure Laterality Date  . ABDOMINAL AORTAGRAM N/A 04/18/2012    Procedure: ABDOMINAL Maxcine Ham;  Surgeon: Angelia Mould, MD;  Location: [facility] CATH LAB;  Service: Cardiovascular;  Laterality: N/A;  . AMPUTATION Right 05/19/2019   Procedure: RIGHT FOOT 5TH RAY AMPUTATION;  Surgeon: Newt Minion, MD;  Location: [place];  Service: Orthopedics;  Laterality: Right;  . CARDIAC CATHETERIZATION N/A 09/08/2016   Procedure: Left Heart Cath and Coronary Angiography;  Surgeon: Adrian Prows, MD;  Location: [place] CV LAB;  Service: Cardiovascular;  Laterality: N/A;  . CATARACT EXTRACTION, BILATERAL  09/2017, 10/2017   Dr. Herbert Deaner  . COLONOSCOPY W/ BIOPSIES AND POLYPECTOMY    .  LOWER EXTREMITY ANGIOGRAM Bilateral 04/18/2012   Procedure: LOWER EXTREMITY ANGIOGRAM;  Surgeon: Angelia Mould, MD;  Location: [facility] CATH LAB;  Service: Cardiovascular;  Laterality: Bilateral;  bilat lower extrem angio  . LOWER EXTREMITY ANGIOGRAPHY Bilateral 05/02/2019   Procedure: LOWER EXTREMITY ANGIOGRAPHY;  Surgeon: Adrian Prows, MD;  Location: [place] CV LAB;  Service: Cardiovascular;  Laterality: Bilateral;  . LOWER EXTREMITY ANGIOGRAPHY Bilateral 02/28/2019   Procedure: LOWER EXTREMITY ANGIOGRAPHY;  Surgeon: Adrian Prows, MD;  Location: [place] CV LAB;  Service: Cardiovascular;  Laterality: Bilateral;  . macular photocoagulation     (eye treatments for diabetic retinopathy)-Dr. Zigmund Daniel  . PERIPHERAL VASCULAR INTERVENTION  02/28/2019   Procedure: PERIPHERAL VASCULAR INTERVENTION;  Surgeon: Adrian Prows, MD;  Location: [place] CV LAB;  Service: Cardiovascular;;   Social History   Occupational History  . 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 Smoking status: Former Smoker    Packs/day: 1.00    Years: 30.00    Pack years: 30.00    Types: Cigarettes    Quit date: 01/08/2012    Years since quitting: 7.3  . Smokeless tobacco: Never Used  Substance and Sexual Activity  . Alcohol use: Yes    Comment: rare  .  Drug use: No  . Sexual activity: Not Currently

## 2019-05-29 ENCOUNTER — Encounter (INDEPENDENT_AMBULATORY_CARE_PROVIDER_SITE_OTHER): Payer: BC Managed Care – PPO | Admitting: Ophthalmology

## 2019-05-29 ENCOUNTER — Other Ambulatory Visit: Payer: Self-pay

## 2019-05-29 DIAGNOSIS — E113391 Type 2 diabetes mellitus with moderate nonproliferative diabetic retinopathy without macular edema, right eye: Secondary | ICD-10-CM | POA: Diagnosis not present

## 2019-05-29 DIAGNOSIS — E113512 Type 2 diabetes mellitus with proliferative diabetic retinopathy with macular edema, left eye: Secondary | ICD-10-CM | POA: Diagnosis not present

## 2019-05-29 DIAGNOSIS — I10 Essential (primary) hypertension: Secondary | ICD-10-CM | POA: Diagnosis not present

## 2019-05-29 DIAGNOSIS — H43813 Vitreous degeneration, bilateral: Secondary | ICD-10-CM

## 2019-05-29 DIAGNOSIS — E11311 Type 2 diabetes mellitus with unspecified diabetic retinopathy with macular edema: Secondary | ICD-10-CM

## 2019-05-29 DIAGNOSIS — H35033 Hypertensive retinopathy, bilateral: Secondary | ICD-10-CM

## 2019-06-02 ENCOUNTER — Encounter: Payer: Self-pay | Admitting: Physician Assistant

## 2019-06-02 ENCOUNTER — Ambulatory Visit (INDEPENDENT_AMBULATORY_CARE_PROVIDER_SITE_OTHER): Payer: BC Managed Care – PPO | Admitting: Physician Assistant

## 2019-06-02 VITALS — Ht 70.0 in | Wt 283.0 lb

## 2019-06-02 DIAGNOSIS — I739 Peripheral vascular disease, unspecified: Secondary | ICD-10-CM

## 2019-06-02 DIAGNOSIS — E1142 Type 2 diabetes mellitus with diabetic polyneuropathy: Secondary | ICD-10-CM

## 2019-06-02 DIAGNOSIS — Z794 Long term (current) use of insulin: Secondary | ICD-10-CM

## 2019-06-02 DIAGNOSIS — N183 Chronic kidney disease, stage 3 unspecified: Secondary | ICD-10-CM

## 2019-06-02 DIAGNOSIS — Z89421 Acquired absence of other right toe(s): Secondary | ICD-10-CM

## 2019-06-02 MED ORDER — DOXYCYCLINE HYCLATE 100 MG PO CAPS: 100.0000 mg | ORAL_CAPSULE | Freq: Two times a day (BID) | ORAL | 1 refills | Status: DC

## 2019-06-02 MED ORDER — OXYCODONE-ACETAMINOPHEN 5-325 MG PO TABS: 1.0000 | ORAL_TABLET | Freq: Four times a day (QID) | ORAL | 0 refills | Status: DC | PRN

## 2019-06-02 NOTE — Progress Notes (Signed)
Office Visit Note   Patient: Eduardo [name]           Date of Birth: [birth year]           MRN: [id number] Visit Date: 06/02/2019              Requested by: Rita Ohara, [address] [place],  [place] [address] PCP: Rita Ohara, MD  Chief Complaint  Patient presents with  . Right Foot - Routine Post Op    05/19/19 right 5th ray amputation       HPI: The patient is seen for postoperative follow-up following a right fifth ray amputation on 05/19/2019.  He has been washing the area.  He finished a course of ampicillin which was prescribed from the hospital last week.  He does note moderate pain over the area at times.  He is noticed some minimal drainage which is brownish-red.  He has intermittently been taking a protein supplement, he takes probiotics daily and multivitamin and vitamin C daily.  Assessment & Plan: Visit Diagnoses:  1. 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 Type 2 diabetes mellitus with diabetic polyneuropathy, with long-term current use of insulin ([employer])   3. CKD (chronic kidney disease) stage 3, GFR 30-59 ml/min (HCC)   4. PAD (peripheral artery disease) (HCC)     Plan: Start doxycycline 100 mg p.o. twice daily x2 weeks.  Pain medication was refilled this visit.  We will leave sutures in place for another week.  Continue to wash area daily with soap and water and apply dry dressing and Ace wrap.  Patient was also given written information concerning protein supplementation, probiotics, vitamin C multivitamin and zinc many of which she is already doing.  Follow-Up Instructions: Return in about 1 week (around 06/09/2019).   Ortho Exam  Patient is alert, oriented, no adenopathy, well-dressed, normal affect, normal respiratory effort. Right first ray incision is showing some minimal dehiscence proximally.  Sutures are intact but still under tension.  There is some localized erythema about the proximal foot and there is continued  swelling.  There is no odor.  There is some mild tenderness to palpation.  He has palpable pedal pulses.  Imaging: No results found.   Labs: Lab Results  Component Value Date   HGBA1C 7.2 04/14/2019   HGBA1C 7.2 06/09/2018   HGBA1C 7.1% 05/24/2017   REPTSTATUS 05/02/2019 FINAL 04/28/2019   GRAMSTAIN  04/28/2019    NO WBC SEEN NO ORGANISMS SEEN Performed at [facility], [place] [address], [place], [place] [address]    CULT RARE ENTEROCOCCUS FAECALIS 04/28/2019   LABORGA ENTEROCOCCUS FAECALIS 04/28/2019     Lab Results  Component Value Date   ALBUMIN 3.8 04/21/2019   ALBUMIN 4.1 09/28/2011    No results found for: MG No results found for: VD25OH  No results found for: PREALBUMIN CBC EXTENDED Latest Ref Rng & Units 05/19/2019 04/21/2019 02/23/2019  WBC 4.0 - 10.5 K/uL 8.5 7.7 7.9  RBC 4.22 - 5.81 MIL/uL 4.66 4.85 5.05  HGB 13.0 - 17.0 g/dL 14.3 15.3 15.8  HCT 39.0 - 52.0 % 43.0 44.9 45.9  PLT 150 - 400 K/uL 186 198 211  NEUTROABS 1.7 - 7.7 K/uL - - -  LYMPHSABS 0.7 - 4.0 K/uL - - -     Body mass index is 40.61 kg/m.  Orders:  No orders of the defined types were placed in this encounter.  Meds ordered this  encounter  Medications  . doxycycline (VIBRAMYCIN) 100 MG capsule    Sig: Take 1 capsule (100 mg total) by mouth 2 (two) times daily.    Dispense:  28 capsule    Refill:  1  . oxyCODONE-acetaminophen (PERCOCET) 5-325 MG tablet    Sig: Take 1 tablet by mouth every 6 (six) hours as needed for moderate pain or severe pain.    Dispense:  20 tablet    Refill:  0     Procedures: No procedures performed  Clinical Data: No additional findings.  ROS:  All other systems negative, except as noted in the HPI. Review of Systems  Objective: Vital Signs: Ht 5\' 10"  (1.778 m)   Wt 283 lb (128.4 kg)   BMI 40.61 kg/m   Specialty Comments:  No specialty comments available.  PMFS History: Patient Active Problem List   Diagnosis Date Noted  . Subacute  osteomyelitis, right ankle and foot ([place])   . Ischemic ulcer of lower leg due to atherosclerosis ([place]) 02/28/2019  . Essential hypertension, benign 02/06/2019  . Hypercholesteremia 02/06/2019  . CKD (chronic kidney disease) stage 3, GFR 30-59 ml/min (HCC) 10/06/2017  . Proteinuria 10/06/2017  . Type 2 diabetes mellitus with nephropathy ([employer] [facility] [employer] [facility]) 10/06/2017  . 3-vessel coronary artery disease 05/23/2017  . Abnormal nuclear stress test 09/06/2016  . PAD (peripheral artery disease) ([place]) 10/28/2012  . Morbid obesity with BMI of 40.0-44.9, adult ([place]) 10/13/2012  . Diabetes mellitus with ophthalmic complication ([place]) 98/33/8250  . Hypertension associated with diabetes ([place]) 10/13/2012  . Atherosclerosis of native artery of extremity with intermittent claudication ([place]) 04/05/2012  . Adenomatous colon polyp 09/28/2011  . Pure hyperglyceridemia 09/28/2011  . Erectile dysfunction 09/28/2011  . Diabetic retinopathy ([place]) 09/28/2011   Past Medical History:  Diagnosis Date  . Chronic kidney disease    stage 3  . Colon polyp   . Coronary artery disease   . Diabetes mellitus 1987   under care of Dr. Chalmers Cater.  On insulin since 96 (off and on)  . Diabetic retinopathy   . Dupuytren contracture    R hand, s/p injection (Dr. Lenon Curt)  . Essential hypertension, benign   . Essential hypertension, benign 02/06/2019  . Frequency of urination and polyuria   . Hypertension   . Myocardial infarction [facility])    denies  . Neuromuscular disorder ([place])    Diabetic neuropathy  . Osteomyelitis ([place])    right foot  . Other testicular hypofunction   . Peripheral arterial disease ([place]) 10/28/2012  . Peritoneal abscess ([place]) 6/08   and buttock.  . Pneumonia   . Polydipsia   . Proteinuria   . Pure hyperglyceridemia   . Wears glasses     Family History  Problem Relation Age of Onset  . Diabetes Mother   . Hearing loss Mother   . Hypertension Mother   . Hyperlipidemia Mother   . Heart disease Mother   .  Varicose Veins Mother   . Varicose Veins Father   . Dementia Father   . Hyperlipidemia Brother   . Diabetes Maternal Grandmother     Past Surgical History:  Procedure Laterality Date  . ABDOMINAL AORTAGRAM N/A 04/18/2012   Procedure: ABDOMINAL Maxcine Ham;  Surgeon: Angelia Mould, MD;  Location[facility] CATH LAB;  Service: Cardiovascular;  Laterality: N/A;  . AMPUTATION Right 05/19/2019   Procedure: RIGHT FOOT 5TH RAY AMPUTATION;  Surgeon: Newt Minion, MD;  Location: [place];  Service: Orthopedics;  Laterality: Right;  . CARDIAC CATHETERIZATION N/A  09/08/2016   Procedure: Left Heart Cath and Coronary Angiography;  Surgeon: Adrian Prows, MD;  Location: [place] CV LAB;  Service: Cardiovascular;  Laterality: N/A;  . CATARACT EXTRACTION, BILATERAL  09/2017, 10/2017   Dr. Herbert Deaner  . COLONOSCOPY W/ BIOPSIES AND POLYPECTOMY    . LOWER EXTREMITY ANGIOGRAM Bilateral 04/18/2012   Procedure: LOWER EXTREMITY ANGIOGRAM;  Surgeon: Angelia Mould, MD;  Location: [facility] CATH LAB;  Service: Cardiovascular;  Laterality: Bilateral;  bilat lower extrem angio  . LOWER EXTREMITY ANGIOGRAPHY Bilateral 05/02/2019   Procedure: LOWER EXTREMITY ANGIOGRAPHY;  Surgeon: Adrian Prows, MD;  Location: [place] CV LAB;  Service: Cardiovascular;  Laterality: Bilateral;  . LOWER EXTREMITY ANGIOGRAPHY Bilateral 02/28/2019   Procedure: LOWER EXTREMITY ANGIOGRAPHY;  Surgeon: Adrian Prows, MD;  Location: [place] CV LAB;  Service: Cardiovascular;  Laterality: Bilateral;  . macular photocoagulation     (eye treatments for diabetic retinopathy)-Dr. Zigmund Daniel  . PERIPHERAL VASCULAR INTERVENTION  02/28/2019   Procedure: PERIPHERAL VASCULAR INTERVENTION;  Surgeon: Adrian Prows, MD;  Location: [place] CV LAB;  Service: Cardiovascular;;   Social History   Occupational History  . 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 Smoking status: Former Smoker     Packs/day: 1.00    Years: 30.00    Pack years: 30.00    Types: Cigarettes    Quit date: 01/08/2012    Years since quitting: 7.4  . Smokeless tobacco: Never Used  Substance and Sexual Activity  . Alcohol use: Yes    Comment: rare  . Drug use: No  . Sexual activity: Not Currently

## 2019-06-09 ENCOUNTER — Encounter: Payer: Self-pay | Admitting: Physician Assistant

## 2019-06-09 ENCOUNTER — Ambulatory Visit (INDEPENDENT_AMBULATORY_CARE_PROVIDER_SITE_OTHER): Payer: BC Managed Care – PPO | Admitting: Physician Assistant

## 2019-06-09 ENCOUNTER — Other Ambulatory Visit: Payer: Self-pay

## 2019-06-09 DIAGNOSIS — L97516 Non-pressure chronic ulcer of other part of right foot with bone involvement without evidence of necrosis: Secondary | ICD-10-CM | POA: Diagnosis not present

## 2019-06-09 DIAGNOSIS — N183 Chronic kidney disease, stage 3 unspecified: Secondary | ICD-10-CM

## 2019-06-09 DIAGNOSIS — I10 Essential (primary) hypertension: Secondary | ICD-10-CM | POA: Diagnosis not present

## 2019-06-09 DIAGNOSIS — M609 Myositis, unspecified: Secondary | ICD-10-CM | POA: Diagnosis not present

## 2019-06-09 DIAGNOSIS — E1142 Type 2 diabetes mellitus with diabetic polyneuropathy: Secondary | ICD-10-CM

## 2019-06-09 DIAGNOSIS — L97522 Non-pressure chronic ulcer of other part of left foot with fat layer exposed: Secondary | ICD-10-CM | POA: Diagnosis not present

## 2019-06-09 DIAGNOSIS — Z89421 Acquired absence of other right toe(s): Secondary | ICD-10-CM

## 2019-06-09 DIAGNOSIS — I2510 Atherosclerotic heart disease of native coronary artery without angina pectoris: Secondary | ICD-10-CM | POA: Diagnosis not present

## 2019-06-09 DIAGNOSIS — E1169 Type 2 diabetes mellitus with other specified complication: Secondary | ICD-10-CM | POA: Diagnosis not present

## 2019-06-09 DIAGNOSIS — E11621 Type 2 diabetes mellitus with foot ulcer: Secondary | ICD-10-CM | POA: Diagnosis not present

## 2019-06-09 DIAGNOSIS — I739 Peripheral vascular disease, unspecified: Secondary | ICD-10-CM

## 2019-06-09 DIAGNOSIS — Z794 Long term (current) use of insulin: Secondary | ICD-10-CM

## 2019-06-09 DIAGNOSIS — L03115 Cellulitis of right lower limb: Secondary | ICD-10-CM | POA: Diagnosis not present

## 2019-06-09 DIAGNOSIS — E1151 Type 2 diabetes mellitus with diabetic peripheral angiopathy without gangrene: Secondary | ICD-10-CM | POA: Diagnosis not present

## 2019-06-09 DIAGNOSIS — M869 Osteomyelitis, unspecified: Secondary | ICD-10-CM | POA: Diagnosis not present

## 2019-06-09 NOTE — Progress Notes (Signed)
Office Visit Note   Patient: Eduardo [name]           Date of Birth: Dec 25, 1955           MRN: [id number] Visit Date: 06/09/2019              Requested by: Rita Ohara, MD [address] [place],  [place] [address] PCP: Rita Ohara, MD  Chief Complaint  Patient presents with   Right Foot - Routine Post Op      HPI: The patient is a 63 year old gentleman seen for follow-up of his right fifth ray amputation on 05/19/2019.  He is washing the area daily and applying a dry dressing.  He is on doxycycline and has at least another week of this.  He has been offloading and elevating the foot as much as possible.  He notes some minimal yellowish drainage.  He is taking a protein supplement, probiotics, multivitamin and vitamin C daily.  Assessment & Plan: Visit Diagnoses:  1. 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 Type 2 diabetes mellitus with diabetic polyneuropathy, with long-term current use of insulin ([place])   3. CKD (chronic kidney disease) stage 3, GFR 30-59 ml/min (HCC)   4. PAD (peripheral artery disease) ([place])     Plan: We will leave sutures in another week.  Continue to offload and elevate the foot as much as possible.  Continue to wash the foot daily with soap and water and apply a dry dressing and Ace wrap.  Continue information on protein supplementation probiotics and multivitamin vitamin C and zinc.  Follow-up in 1 week.  Follow-Up Instructions: Return in about 1 week (around 06/16/2019).   Ortho Exam  Patient is alert, oriented, no adenopathy, well-dressed, normal affect, normal respiratory effort. Right foot fifth ray incision shows some proximal dehiscence which is stable.  Sutures are still under some tension particularly in the proximal area.  There is some minimal localized erythema persisting but overall improved.  His swelling is continuing to improve.  There is no odor but some scant serous appearing drainage.  He does have palpable  pedal pulses.  Imaging: No results found. No images are attached to the encounter.  Labs: Lab Results  Component Value Date   HGBA1C 7.2 04/14/2019   HGBA1C 7.2 06/09/2018   HGBA1C 7.1% 05/24/2017   REPTSTATUS 05/02/2019 FINAL 04/28/2019   GRAMSTAIN  04/28/2019    NO WBC SEEN NO ORGANISMS SEEN Performed at [facility], [place] [address] [place], [place] [address]    CULT RARE ENTEROCOCCUS FAECALIS 04/28/2019   LABORGA ENTEROCOCCUS FAECALIS 04/28/2019     Lab Results  Component Value Date   ALBUMIN 3.8 04/21/2019   ALBUMIN 4.1 09/28/2011    No results found for: MG No results found for: VD25OH  No results found for: PREALBUMIN CBC EXTENDED Latest Ref Rng & Units 05/19/2019 04/21/2019 02/23/2019  WBC 4.0 - 10.5 K/uL 8.5 7.7 7.9  RBC 4.22 - 5.81 MIL/uL 4.66 4.85 5.05  HGB 13.0 - 17.0 g/dL 14.3 15.3 15.8  HCT 39.0 - 52.0 % 43.0 44.9 45.9  PLT 150 - 400 K/uL 186 198 211  NEUTROABS 1.7 - 7.7 K/uL - - -  LYMPHSABS 0.7 - 4.0 K/uL - - -     There is no height or weight on file to calculate BMI.  Orders:  No orders of the defined types were placed in this encounter.  No orders of the defined  types were placed in this encounter.    Procedures: No procedures performed  Clinical Data: No additional findings.  ROS:  All other systems negative, except as noted in the HPI. Review of Systems  Objective: Vital Signs: There were no vitals taken for this visit.  Specialty Comments:  No specialty comments available.  PMFS History: Patient Active Problem List   Diagnosis Date Noted   Subacute osteomyelitis, right ankle and foot ([place])    Ischemic ulcer of lower leg due to atherosclerosis ([place]) 02/28/2019   Essential hypertension, benign 02/06/2019   Hypercholesteremia 02/06/2019   CKD (chronic kidney disease) stage 3, GFR 30-59 ml/min (HCC) 10/06/2017   Proteinuria 10/06/2017   Type 2 diabetes mellitus with nephropathy ([place]) 10/06/2017   3-vessel  coronary artery disease 05/23/2017   Abnormal nuclear stress test 09/06/2016   PAD (peripheral artery disease) ([place]) 10/28/2012   Morbid obesity with BMI of 40.0-44.9, adult ([place]) 10/13/2012   Diabetes mellitus with ophthalmic complication ([place]) 17/40/8144   Hypertension associated with diabetes ([place]) 10/13/2012   Atherosclerosis of native artery of extremity with intermittent claudication ([place]) 04/05/2012   Adenomatous colon polyp 09/28/2011   Pure hyperglyceridemia 09/28/2011   Erectile dysfunction 09/28/2011   Diabetic retinopathy ([place]) 09/28/2011   Past Medical History:  Diagnosis Date   Chronic kidney disease    stage 3   Colon polyp    Coronary artery disease    Diabetes mellitus 1987   under care of Dr. Chalmers Cater.  On insulin since 96 (off and on)   Diabetic retinopathy    Dupuytren contracture    R hand, s/p injection (Dr. Lenon Curt)   Essential hypertension, benign    Essential hypertension, benign 02/06/2019   Frequency of urination and polyuria    Hypertension    Myocardial infarction [facility])    denies   Neuromuscular disorder ([place])    Diabetic neuropathy   Osteomyelitis ([place])    right foot   Other testicular hypofunction    Peripheral arterial disease ([place]) 10/28/2012   Peritoneal abscess ([place]) 6/08   and buttock.   Pneumonia    Polydipsia    Proteinuria    Pure hyperglyceridemia    Wears glasses     Family History  Problem Relation Age of Onset   Diabetes Mother    Hearing loss Mother    Hypertension Mother    Hyperlipidemia Mother    Heart disease Mother    Varicose Veins Mother    Varicose Veins Father    Dementia Father    Hyperlipidemia Brother    Diabetes Maternal Grandmother     Past Surgical History:  Procedure Laterality Date   ABDOMINAL AORTAGRAM N/A 04/18/2012   Procedure: ABDOMINAL Maxcine Ham;  Surgeon: Angelia Mould, MD;  Location: [facility] CATH LAB;  Service: Cardiovascular;  Laterality: N/A;    AMPUTATION Right 05/19/2019   Procedure: RIGHT FOOT 5TH RAY AMPUTATION;  Surgeon: Newt Minion, MD;  Location: [place];  Service: Orthopedics;  Laterality: Right;   CARDIAC CATHETERIZATION N/A 09/08/2016   Procedure: Left Heart Cath and Coronary Angiography;  Surgeon: Adrian Prows, MD;  Location: [place] CV LAB;  Service: Cardiovascular;  Laterality: N/A;   CATARACT EXTRACTION, BILATERAL  09/2017, 10/2017   Dr. Herbert Deaner   COLONOSCOPY W/ BIOPSIES AND POLYPECTOMY     LOWER EXTREMITY ANGIOGRAM Bilateral 04/18/2012   Procedure: LOWER EXTREMITY ANGIOGRAM;  Surgeon: Angelia Mould, MD;  Location: [facility] CATH LAB;  Service: Cardiovascular;  Laterality: Bilateral;  bilat lower extrem angio  LOWER EXTREMITY ANGIOGRAPHY Bilateral 05/02/2019   Procedure: LOWER EXTREMITY ANGIOGRAPHY;  Surgeon: Adrian Prows, MD;  Location: [place] CV LAB;  Service: Cardiovascular;  Laterality: Bilateral;   LOWER EXTREMITY ANGIOGRAPHY Bilateral 02/28/2019   Procedure: LOWER EXTREMITY ANGIOGRAPHY;  Surgeon: Adrian Prows, MD;  Location: [place] CV LAB;  Service: Cardiovascular;  Laterality: Bilateral;   macular photocoagulation     (eye treatments for diabetic retinopathy)-Dr. 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

## 2019-06-11 NOTE — Patient Instructions (Addendum)
  HEALTH MAINTENANCE RECOMMENDATIONS:  It is recommended that you get at least 30 minutes of aerobic exercise at least 5 days/week (for weight loss, you may need as much as 60-90 minutes). This can be any activity that gets your heart rate up. This can be divided in 10-15 minute intervals if needed, but try and build up your endurance at least once a week.  Weight bearing exercise is also recommended twice weekly.  Eat a healthy diet with lots of vegetables, fruits and fiber.  "Colorful" foods have a lot of vitamins (ie green vegetables, tomatoes, red peppers, etc).  Limit sweet tea, regular sodas and alcoholic beverages, all of which has a lot of calories and sugar.  Up to 2 alcoholic drinks daily may be beneficial for men (unless trying to lose weight, watch sugars).  Drink a lot of water.  Sunscreen of at least SPF 30 should be used on all sun-exposed parts of the skin when outside between the hours of 10 am and 4 pm (not just when at beach or pool, but even with exercise, golf, tennis, and yard work!)  Use a sunscreen that says "broad spectrum" so it covers both UVA and UVB rays, and make sure to reapply every 1-2 hours.  Remember to change the batteries in your smoke detectors when changing your clock times in the spring and fall.  Use your seat belt every time you are in a car, and please drive safely and not be distracted with cell phones and texting while driving.  Let us know the quantity and pharmacy for sending in a prescription for the generic version of low-dose viagra (sildenafil 20mg  tablets).  Directions would be to take between 2 and 5 (lowest effective dose, max of 100mg ) as needed for erectile dysfunction.

## 2019-06-11 NOTE — Progress Notes (Signed)
Chief Complaint  Patient presents with  . Annual Exam    nonfasting annual exam. No new concerns.     Eduardo [name] is a 63 y.o. male who presents for a complete physical.  He has the following concerns:  Heis under the care [facility] for Diabetes. His last A1c was 7.2 in 04/2019. Insulin was adjusted--humalin 40U in morning, 30 before dinner, and sliding scale of humalog.  Sugars have improved. Sugars are running 90-120 in the mornings (as long as he eats before 7pm); evenings are 140-150's, higher since inactivity related to his ulcers.  He sends her his sugars weekly. Denies any recent hypoglycemia. Denies polydipsia, urinary frequency only due to diuretic.  Under the care of Dr. Zigmund Daniel for diabetic retinopathy, last seen 05/29/2019. He had been doing well, but just required another injection (for macular edema).  Hereports peripheral neuropathy withnumbness in his feet, not painful. He goes to [facility] for toenail care and foot exams. 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 Sharol Given on 05/19/2019. He had an ulcer over the fifth metatarsal head that probes to bone. MRI scan shows osteomyelitis of the fifth metatarsal head and base of the proximal phalanx of the little toe.  He was last seen in f/u on 7/31.  He remains on doxycycline.  There was some proximal dehiscence noted; sutures remain in place.  Has f/u scheduled this week. It is doing "okay"--no drainage, fever, pain.  Still has some swelling. Bled some after removing a bandage that was stuck to it.  He continues at the wound care clinic (now every 2 weeks) for the ulcer on the left foot, which is healing.  He is under the care of Dr. Einar Gip for PAD.  Per his note, had right SFA occlusion S/P complex PV angiogram on 02/28/2019 with PTA to occluded right SFA with implantation of 3 overlapping 6.0 x 120 x2; 6.0 x 100 mm Eluvia DES. He needs dual antiplatelet therapy for at least 6  months due to drug-eluting stent. He denies claudication. They evaluated the left side in June, and it was okay.  3 vessel CAD: He is under the care of Dr. Einar Gip, last seen 01/2019. Asymptomatic and no changes were made.  Last cath was 09/08/2016 revealing severe triple-vessel CAD with no targets for CABG, on aggressive medical therapy. He denies chest pain, shortness of breath.  Prior eval included abnormal stress test. Echo showed LVH, and decreased LV function, EF approx 40%, and showed mild global hypokinesis, grade 1 diastolic dysfunction.He also had abnormal carotid US, showing occlusion of L ICA in 08/2016 (per Dr. Irven Shelling records). Pt states he had f/u repeated a year later, and it was fine.  Stage 3 CKD--under the care of Dr. Jimmy Footman. Last seen in 02/2019 and no changes were made. (he is retiring, so will be seeing someone else).  Obesity: He started at [employer] January, 2018; initial weight was 336#. He continues to do [employer], but exercise has been limited for several months.  Regained some weight back.  He isn't going to the meetings (didn't think the virtual visit was helpful), but still trying to follow the program.  HTN--he doesn't check BP's elsewhere, but checked by all his other doctors, always okay.  Denies dizziness. BP Readings from Last 3 Encounters:  05/19/19 105/62  05/09/19 (!) 115/59  05/02/19 (!) 127/56   Dupuytren's contractures--Can't straighten right pinkie or left ring finger.  There is some discomfort/aching,  relieved by aspercreme.  Prior injection with Dr. Lenon Curt wasn't beneficial (only slight improvement, temporarily).  Erectile dysfunction has recurred. He had tried Viagra in the past (not his own prescription), which was effective.  He is interested in having it on hand.  Immunization History  Administered Date(s) Administered  . Influenza Split 08/10/2011  . Influenza,inj,Quad PF,6+ Mos 10/15/2016, 10/15/2017, 08/09/2018  . Pneumococcal Conjugate-13  05/25/2017  . Pneumococcal Polysaccharide-23 09/28/2011  . Td 06/03/2005  . Tdap 09/28/2011  . Zoster Recombinat (Shingrix) 06/06/2018, 08/09/2018   Last colonoscopy:11/2014,Dr.Magod-ext and int hemorrhoids and diverticulosis. (that was a 3 yr f/u, h/o polyps in past). Due again 2021 Last PSA: 2.8 in 05/2018 Dentist:twice yearly Ophtho:regularly (due to retinopathy) Exercise:"not much"--not supposed to walk Prior routine had been walking 1 mile in the morning 4-5 days/week plus bodyweight training every other day.   Doctors caring for patient:  Endo: Dr. Chalmers Cater Cardiology: Dr. Einar Gip Nephro: Dr. Jimmy Footman Derm: Dr. Renda Rolls GI: Dr. Watt Climes Ophtho: Dr. Zigmund Daniel, Dr. Peter Garter (previous Dr. Herbert Deaner, did cataract surgery) Podiatry: Dr. Jacqualyn Posey Dentist: [facility]  Past Medical History:  Diagnosis Date  . Chronic kidney disease    stage 3  . Colon polyp   . Coronary artery disease   . Diabetes mellitus 1987   under care of Dr. Chalmers Cater.  On insulin since 96 (off and on)  . Diabetic retinopathy   . Dupuytren contracture    R hand, s/p injection (Dr. Lenon Curt)  . Essential hypertension, benign   . Essential hypertension, benign 02/06/2019  . Frequency of urination and polyuria   . Hypertension   . Myocardial infarction [facility])    denies  . Neuromuscular disorder ([place])    Diabetic neuropathy  . Osteomyelitis ([place])    right foot  . Other testicular hypofunction   . Peripheral arterial disease ([place]) 10/28/2012  . Peritoneal abscess ([place]) 6/08   and buttock.  . Pneumonia   . Polydipsia   . Proteinuria   . Pure hyperglyceridemia   . Wears glasses     Past Surgical History:  Procedure Laterality Date  . ABDOMINAL AORTAGRAM N/A 04/18/2012   Procedure: ABDOMINAL Maxcine Ham;  Surgeon: Angelia Mould, MD;  Location: [facility] CATH LAB;  Service: Cardiovascular;  Laterality: N/A;  . AMPUTATION Right 05/19/2019   Procedure: RIGHT FOOT 5TH RAY AMPUTATION;  Surgeon:  Newt Minion, MD;  Location: [place];  Service: Orthopedics;  Laterality: Right;  . CARDIAC CATHETERIZATION N/A 09/08/2016   Procedure: Left Heart Cath and Coronary Angiography;  Surgeon: Adrian Prows, MD;  Location: [facility] CV LAB;  Service: Cardiovascular;  Laterality: N/A;  . CATARACT EXTRACTION, BILATERAL  09/2017, 10/2017   Dr. Herbert Deaner  . COLONOSCOPY W/ BIOPSIES AND POLYPECTOMY    . LOWER EXTREMITY ANGIOGRAM Bilateral 04/18/2012   Procedure: LOWER EXTREMITY ANGIOGRAM;  Surgeon: Angelia Mould, MD;  Location: [facility] CATH LAB;  Service: Cardiovascular;  Laterality: Bilateral;  bilat lower extrem angio  . LOWER EXTREMITY ANGIOGRAPHY Bilateral 05/02/2019   Procedure: LOWER EXTREMITY ANGIOGRAPHY;  Surgeon: Adrian Prows, MD;  Location: [facility] CV LAB;  Service: Cardiovascular;  Laterality: Bilateral;  . LOWER EXTREMITY ANGIOGRAPHY Bilateral 02/28/2019   Procedure: LOWER EXTREMITY ANGIOGRAPHY;  Surgeon: Adrian Prows, MD;  Location: [place] CV LAB;  Service: Cardiovascular;  Laterality: Bilateral;  . macular photocoagulation     (eye treatments for diabetic retinopathy)-Dr. Zigmund Daniel  . PERIPHERAL VASCULAR INTERVENTION  02/28/2019   Procedure: PERIPHERAL VASCULAR INTERVENTION;  Surgeon: Adrian Prows, MD;  Location: [facility]  INVASIVE CV LAB;  Service: Cardiovascular;;    Social History   Socioeconomic History  . Marital status: Widowed    Spouse name: Not on file  . Number of children: 0  . Years of education: Not on file  . Highest education level: Not on file  Occupational History  . 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 Financial resource strain: Not on file  . Food insecurity    Worry: Not on file    Inability: Not on file  . Transportation needs    Medical: Not on file    Non-medical: Not on file  Tobacco Use  . Smoking status: Former Smoker    Packs/day: 1.00    Years: 30.00    Pack years: 30.00    Types:  Cigarettes    Quit date: 01/08/2012    Years since quitting: 7.4  . Smokeless tobacco: Never Used  Substance and Sexual Activity  . Alcohol use: Yes    Comment: rare  . Drug use: No  . Sexual activity: Yes    Partners: Female    Birth control/protection: Condom  Lifestyle  . Physical activity    Days per week: Not on file    Minutes per session: Not on file  . Stress: Not on file  Relationships  . Social Herbalist on phone: Not on file    Gets together: Not on file    Attends religious service: Not on file    Active member of club or organization: Not on file    Attends meetings of clubs or organizations: Not on file    Relationship status: Not on file  . Intimate partner violence    Fear of current or ex partner: Not on file    Emotionally abused: Not on file    Physically abused: Not on file    Forced sexual activity: Not on file  Other Topics Concern  . Not on file  Social History Narrative   Widowed.    Girlfriends stays with him frequently (since surgery 05/2019).   Previously traveled 40 weeks out of the year. No longer travels at all (since COVID none, cut back on travel in 2019). No pets   Working from home.    Family History  Problem Relation Age of Onset  . Diabetes Mother   . Hearing loss Mother   . Hypertension Mother   . Hyperlipidemia Mother   . Heart disease Mother   . Varicose Veins Mother   . Varicose Veins Father   . Dementia Father   . Hyperlipidemia Brother   . Diabetes Maternal Grandmother     Outpatient Encounter Medications as of 06/12/2019  Medication Sig Note  . B-D UF III MINI PEN NEEDLES 31G X 5 MM MISC U UTD 6 TIMES A DAY   . Bioflavonoid Products (ESTER-C) 1000-50 MG TABS Take 1 tablet by mouth daily.    . Calcium Alginate-Silver (ANTIBACTERIAL ALGINATE W/SILV) 4.25"X4.25" PADS Place 1 patch onto the skin daily. Applied to wounds of each foot   . Cholecalciferol (VITAMIN D) 2000 units CAPS Take 2,000 Units by mouth daily.    . clopidogrel (PLAVIX) 75 MG tablet Take 1 tablet (75 mg total) by mouth daily.   Marland Kitchen doxycycline (VIBRAMYCIN) 100 MG capsule Take 1 capsule (100 mg total) by mouth 2 (two) times daily.   . furosemide (LASIX) 20 MG tablet Take 20 mg by mouth  daily.    . gemfibrozil (LOPID) 600 MG tablet Take 600 mg by mouth 2 (two) times daily before a meal.     . HUMALOG KWIKPEN 100 UNIT/ML KiwkPen Inject 0-20 Units into the skin 2 (two) times daily. SLIDING SCALE DEPENDING ON BLOOD GLUCOSE BEFORE MEAL 06/12/2019: Sliding scale  . HUMULIN N KWIKPEN 100 UNIT/ML Kiwkpen Inject 30-40 Units into the skin See admin instructions. Inject 40 units subcutaneously in the morning & 30 units in the evening. 05/19/2019: Took 20 units  . isosorbide mononitrate (IMDUR) 60 MG 24 hr tablet TAKE 1 TABLET BY MOUTH DAILY (Patient taking differently: Take 60 mg by mouth daily. )   . Krill Oil 350 MG CAPS Take 350 mg by mouth daily. Move Free Ultra Krill Oil   . lisinopril (PRINIVIL,ZESTRIL) 10 MG tablet Take 10 mg by mouth daily.    . metoprolol succinate (TOPROL-XL) 100 MG 24 hr tablet Take 100 mg by mouth daily. Take with or immediately following a meal.   . Multiple Vitamin (MULTIVITAMIN WITH MINERALS) TABS tablet Take 1 tablet by mouth daily.   . Probiotic Product (PROBIOTIC-10 PO) Take 1 capsule by mouth daily.   . rosuvastatin (CRESTOR) 20 MG tablet Take 20 mg by mouth daily.   . Continuous Blood Gluc Sensor (FREESTYLE LIBRE 14 DAY SENSOR) MISC USE UTD Q 14 DAYS SUBCUTANEOUS   . nitroGLYCERIN (NITROSTAT) 0.4 MG SL tablet Place 0.4 mg under the tongue every 5 (five) minutes as needed for chest pain. 09/04/2016: NEVER USED  . oxyCODONE-acetaminophen (PERCOCET) 5-325 MG tablet Take 1 tablet by mouth every 6 (six) hours as needed for moderate pain or severe pain. (Patient not taking: Reported on 06/12/2019) 06/12/2019: Rarely using (prn severe pain)   No facility-administered encounter medications on file as of 06/12/2019.     Allergies   Allergen Reactions  . Food     Mussels=nausea/vomiting    ROS:The patient denies anorexia, fever,headaches, decreased hearing, ear pain, hoarseness, chest pain, palpitations, dizziness, syncope, dyspnea on exertion, cough, swelling, nausea, vomiting, diarrhea, constipation, abdominal pain, melena, hematochezia, indigestion/heartburn, hematuria, incontinence, nocturia, dysuria, genital lesions, joint pains, weakness, tremor, suspicious skin lesions, depression, anxiety, abnormal bleeding/bruising, or enlarged lymph nodes. Rare tinnitus, intermittent. Denies vision problems/changes (2-3 floaters in the left eye, mentioned to his eye doctor at his recent visit) ED has recurred. Numbness in feet R>L.   Some bruising related to plavix and aspirin. No bleeding. Intermittent boils (perineal area),  None recently Has some loose stools after certain foods (spicy [place]).    PHYSICAL EXAM:  BP (!) 160/74   Pulse 60   Temp 97.8 F (36.6 C) (Temporal)   Ht 5\' 10"  (1.778 m)   Wt 284 lb 3.2 oz (128.9 kg)   BMI 40.78 kg/m    Wt Readings from Last 3 Encounters:  06/02/19 283 lb (128.4 kg)  05/26/19 283 lb (128.4 kg)  05/22/19 283 lb (128.4 kg)   Weight 275# at physical last year  General Appearance:  Alert, cooperative, no distress, appears stated age. Head is bald/shaven  Head:  Normocephalic, without obvious abnormality, atraumatic  Eyes:  PERRL, conjunctiva/corneas clear, EOM's intact, fundi not visualized  Ears:  Normal TM's and external ear canals  Nose: Not examined (wearing mask due to COVID-19 pandemic)  Throat:  Not examined (wearing mask due to COVID-19 pandemic)  Neck: Supple, no lymphadenopathy; thyroid: no enlargement/ tenderness/nodules; no carotid bruit or JVD  Back:  Spine nontender, no curvature, ROM normal, no CVA tenderness  Lungs:  Clear to auscultation bilaterally without wheezes, rales or ronchi; respirations unlabored  Chest Wall:  No  tenderness or deformity  Heart:  Regular rate and rhythm, S1 and S2 normal, no murmur, rub or gallop  Breast Exam:  No chest wall tenderness, ormasses   Abdomen:  Soft, non-tender, obese;nondistended, normoactive bowel sounds, no masses, no hepatosplenomegaly  Genitalia:  Normal male external genitalia without lesions. Testicles without masses. No inguinal hernias.  Rectal:  Normal sphincter tone, no masses or tenderness; guaiac negative stool. Prostate smooth, no nodules, mildly enlarged.  Extremities: Limited exam--right foot is wrapped in ace bandage. Left has bandage which was reviewed--callous noted and minimal ulceration.  Pulses: Palpable distal pulses in LE's  Skin: Turgor normal, nosuspiciouslesions.All skin folds are normal, no rashes  Lymph nodes: Cervical, supraclavicular, and inguinal lymph nodes normal  Neurologic:  CNII-XII intact, normal strength, sensation and gait; reflexes 2+ and symmetric throughout.   Psych: Normal mood, affect, hygiene and grooming.  Feet not examined--still seeing wound care clinic for the healing ulcer at the base of the left 5th toe. Also under care of podiatrist for routine foot care.  Recent labs in  Chart reviewed:  Lab Results  Component Value Date   HGBA1C 7.2 04/14/2019   Lab Results  Component Value Date   CHOL 108 04/21/2019   HDL 33 (L) 04/21/2019   LDLCALC 55 04/21/2019   TRIG 102 04/21/2019   CHOLHDL 4.4 09/28/2011     Chemistry      Component Value Date/Time   NA 137 05/19/2019 0935   NA 139 04/21/2019 0912   K 4.4 05/19/2019 0935   CL 107 05/19/2019 0935   CO2 19 (L) 05/19/2019 0935   BUN 35 (H) 05/19/2019 0935   BUN 31 (H) 04/21/2019 0912   CREATININE 1.27 (H) 05/19/2019 0935   CREATININE 1.15 09/28/2011 1358      Component Value Date/Time   CALCIUM 8.5 (L) 05/19/2019 0935   ALKPHOS 68 04/21/2019 0912   AST 21 04/21/2019 0912   ALT 20 04/21/2019 0912    BILITOT 0.4 04/21/2019 0912     Lab Results  Component Value Date   WBC 8.5 05/19/2019   HGB 14.3 05/19/2019   HCT 43.0 05/19/2019   MCV 92.3 05/19/2019   PLT 186 05/19/2019     ASSESSMENT/PLAN:  Annual physical exam - Plan: PSA, TSH, RPR, HIV Antibody (routine testing w rflx)  Screening for prostate cancer - Plan: PSA  PAD (peripheral artery disease) ([place]) - s/p stents, doing well. Cont aspirin, plavix. advised to soak bandage if stuck to avoid bleeding.   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 Chalmers Cater, to whom he reports his sugars regularly - Plan: TSH  3-vessel coronary artery disease   Mixed hyperlipidemia - at goal on current regimen   Class 3 severe obesity due to excess calories with serious comorbidity and body mass index (BMI) of 40.0 to 44.9 in adult [facility]) - counseled re: diet, portions, exercise (non-weight bearing on LE's)  Erectile dysfunction, unspecified erectile dysfunction type   Originally discussed rx for sildenafil (since he stated it was effective in the past). HOWEVER, prior to prescribing, realized that he takes nitrates daily, which is a contraindication due to the potential drop in blood pressure. Will offer referral to urology for other treatment options that are  safer, if desired.  Counseled re: minimizing weight-bearing, shopping more online, agreed with rec to use scooter.   Discussed non-weight bearing cardio exercises he can do while seated.  BP elevated today, but has been low/normal at recent visits elsewhere.  Discussed PSA screening (risks/benefits), recommended at least 30 minutes of aerobic activity at least 5 days/week; proper sunscreen use reviewed; healthy diet and alcohol recommendations (less than or equal to 2 drinks/day) reviewed;  regular seatbelt use; changing batteries in smoke detectors. Immunization recommendations discussed--continue yearly flu shots.  Pneumovax age 61.  Colonoscopy recommendations reviewed, due 11/2019  F/u 1 year for CPE, sooner prn. Continue regular care with other providers.

## 2019-06-12 ENCOUNTER — Other Ambulatory Visit: Payer: Self-pay

## 2019-06-12 ENCOUNTER — Telehealth: Payer: Self-pay | Admitting: Family Medicine

## 2019-06-12 ENCOUNTER — Encounter: Payer: Self-pay | Admitting: Family Medicine

## 2019-06-12 ENCOUNTER — Ambulatory Visit (INDEPENDENT_AMBULATORY_CARE_PROVIDER_SITE_OTHER): Payer: BC Managed Care – PPO | Admitting: Family Medicine

## 2019-06-12 VITALS — BP 160/74 | HR 60 | Temp 97.8°F | Ht 70.0 in | Wt 284.2 lb

## 2019-06-12 DIAGNOSIS — N529 Male erectile dysfunction, unspecified: Secondary | ICD-10-CM

## 2019-06-12 DIAGNOSIS — I739 Peripheral vascular disease, unspecified: Secondary | ICD-10-CM

## 2019-06-12 DIAGNOSIS — N183 Chronic kidney disease, stage 3 unspecified: Secondary | ICD-10-CM

## 2019-06-12 DIAGNOSIS — E782 Mixed hyperlipidemia: Secondary | ICD-10-CM

## 2019-06-12 DIAGNOSIS — Z0000 Encounter for general adult medical examination without abnormal findings: Secondary | ICD-10-CM | POA: Diagnosis not present

## 2019-06-12 DIAGNOSIS — Z794 Long term (current) use of insulin: Secondary | ICD-10-CM

## 2019-06-12 DIAGNOSIS — Z125 Encounter for screening for malignant neoplasm of prostate: Secondary | ICD-10-CM | POA: Diagnosis not present

## 2019-06-12 DIAGNOSIS — Z6841 Body Mass Index (BMI) 40.0 and over, adult: Secondary | ICD-10-CM

## 2019-06-12 DIAGNOSIS — E11311 Type 2 diabetes mellitus with unspecified diabetic retinopathy with macular edema: Secondary | ICD-10-CM

## 2019-06-12 DIAGNOSIS — I2510 Atherosclerotic heart disease of native coronary artery without angina pectoris: Secondary | ICD-10-CM

## 2019-06-12 NOTE — Telephone Encounter (Signed)
Pt called and said the sildenfil can be sent to the [facility] on [address]. He said about 30 Tablets should be fine

## 2019-06-12 NOTE — Telephone Encounter (Signed)
Message sent to patient--after further review of his medications, this med is contraindicated (due to daily nitrate use). Offered to refer to urologist for discussion of other treatment options.

## 2019-06-13 LAB — PSA: Prostate Specific Ag, Serum: 3.3 ng/mL (ref 0.0–4.0)

## 2019-06-13 LAB — HIV ANTIBODY (ROUTINE TESTING W REFLEX): HIV Screen 4th Generation wRfx: NONREACTIVE

## 2019-06-13 LAB — RPR: RPR Ser Ql: NONREACTIVE

## 2019-06-13 LAB — TSH: TSH: 2.25 u[IU]/mL (ref 0.450–4.500)

## 2019-06-15 ENCOUNTER — Ambulatory Visit (INDEPENDENT_AMBULATORY_CARE_PROVIDER_SITE_OTHER): Payer: BC Managed Care – PPO | Admitting: Orthopedic Surgery

## 2019-06-15 ENCOUNTER — Encounter: Payer: Self-pay | Admitting: Orthopedic Surgery

## 2019-06-15 ENCOUNTER — Other Ambulatory Visit: Payer: Self-pay

## 2019-06-15 VITALS — Ht 70.0 in | Wt 284.2 lb

## 2019-06-15 DIAGNOSIS — Z89421 Acquired absence of other right toe(s): Secondary | ICD-10-CM

## 2019-06-16 ENCOUNTER — Telehealth: Payer: Self-pay

## 2019-06-16 NOTE — Telephone Encounter (Signed)
Pt called and states that since having his stitches removed at office visit yesterday the area has been red and tingling. 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

## 2019-06-17 NOTE — Telephone Encounter (Signed)
If still bothering him Monday will you have him come in and be seen

## 2019-06-19 ENCOUNTER — Telehealth: Payer: Self-pay | Admitting: Orthopedic Surgery

## 2019-06-19 NOTE — Telephone Encounter (Signed)
Can you please call pt and make an appt for this week for follow up with Erin or Dr. Sharol Given? He does have one sch for 07/06/19 but was having complications and needs eval please. Thank you

## 2019-06-19 NOTE — Telephone Encounter (Signed)
I called and lm on vm to advise that I was checking to see if any changed from Friday. To call if still experiencing any post surgical changes and we can add to Erin's sch tomorrow. I will hold message until I have heard back from the patient.

## 2019-06-19 NOTE — Telephone Encounter (Signed)
Called patient left message on voicemail to return call to schedule an appointmernt with Dr Sharol Given or Junie Panning for ROV eval

## 2019-06-21 ENCOUNTER — Encounter: Payer: Self-pay | Admitting: Family

## 2019-06-21 ENCOUNTER — Ambulatory Visit (INDEPENDENT_AMBULATORY_CARE_PROVIDER_SITE_OTHER): Payer: BC Managed Care – PPO | Admitting: Family

## 2019-06-21 VITALS — Ht 70.0 in | Wt 284.2 lb

## 2019-06-21 DIAGNOSIS — Z89431 Acquired absence of right foot: Secondary | ICD-10-CM

## 2019-06-21 DIAGNOSIS — E1121 Type 2 diabetes mellitus with diabetic nephropathy: Secondary | ICD-10-CM

## 2019-06-21 DIAGNOSIS — Z89421 Acquired absence of other right toe(s): Secondary | ICD-10-CM

## 2019-06-21 MED ORDER — DOXYCYCLINE HYCLATE 100 MG PO CAPS: 100.0000 mg | ORAL_CAPSULE | Freq: Two times a day (BID) | ORAL | 1 refills | Status: DC

## 2019-06-21 NOTE — Progress Notes (Signed)
Office Visit Note   Patient: Eduardo [name]           Date of Birth: [birth year]           MRN: [id number] Visit Date: 06/21/2019              Requested by: Rita Ohara, [place],  [place] [address] PCP: Rita Ohara, MD  Chief Complaint  Patient presents with  . Right Foot - Routine Post Op    05/19/2019 right foot       HPI: The patient is a 63 year old gentleman seen for follow-up of his right fifth ray amputation on 05/19/2019.  He has been full weightbearing in regular shoewear with a sock since last visit.  Was seen last week and sutures were removed.  Has not been having much drainage however he has had return of erythema to his foot up to the ankle area a little bit of warmth.  Concern for infection.  Assessment & Plan: Visit Diagnoses:  1. Type 2 diabetes mellitus with nephropathy ([place])   2. 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  Encouraged him to resume dressings may use Neosporin and a Band-Aid.  Will call in a course of doxycycline he will follow-up in office in 2 weeks with Dr. Sharol Given as scheduled.  Did discuss gust return precautions or worrisome signs.  Follow-Up Instructions: No follow-ups on file.   Ortho Exam  Patient is alert, oriented, no adenopathy, well-dressed, normal affect, normal respiratory effort.  On examination of the right foot the fifth ray amputation incision is healing well proximally there is about a centimeter and a half area that has not yet healed this is open about 2 mm filled in with some granulation.  There is surrounding callus.  No active drainage however there is some erythema over the dorsum of his foot to the ankle mild warmth no odor no purulence no ascending cellulitis.  Does have palpable dorsalis pedis pulse. Imaging: No results found. No images are attached to the encounter.  Labs: Lab Results  Component Value Date   HGBA1C 7.2  04/14/2019   HGBA1C 7.2 06/09/2018   HGBA1C 7.1% 05/24/2017   REPTSTATUS 05/02/2019 FINAL 04/28/2019   GRAMSTAIN  04/28/2019    NO WBC SEEN NO ORGANISMS SEEN Performed at [facility], [place] [address] [place], [place] [address]    CULT RARE ENTEROCOCCUS FAECALIS 04/28/2019   LABORGA ENTEROCOCCUS FAECALIS 04/28/2019     Lab Results  Component Value Date   ALBUMIN 3.8 04/21/2019   ALBUMIN 4.1 09/28/2011    No results found for: MG No results found for: VD25OH  No results found for: PREALBUMIN CBC EXTENDED Latest Ref Rng & Units 05/19/2019 04/21/2019 02/23/2019  WBC 4.0 - 10.5 K/uL 8.5 7.7 7.9  RBC 4.22 - 5.81 MIL/uL 4.66 4.85 5.05  HGB 13.0 - 17.0 g/dL 14.3 15.3 15.8  HCT 39.0 - 52.0 % 43.0 44.9 45.9  PLT 150 - 400 K/uL 186 198 211  NEUTROABS 1.7 - 7.7 K/uL - - -  LYMPHSABS 0.7 - 4.0 K/uL - - -     Body mass index is 40.78 kg/m.  Orders:  No orders of the defined types were placed in this encounter.  Meds ordered this encounter  Medications  . doxycycline (VIBRAMYCIN) 100 MG capsule    Sig: Take 1 capsule (100 mg total) by mouth 2 (  two) times daily.    Dispense:  60 capsule    Refill:  1     Procedures: No procedures performed  Clinical Data: No additional findings.  ROS:  All other systems negative, except as noted in the HPI. Review of Systems  Constitutional: Negative for chills and fever.  Cardiovascular: Negative for leg swelling.  Skin: Positive for color change.    Objective: Vital Signs: Ht 5\' 10"  (1.778 m)   Wt 284 lb 3.2 oz (128.9 kg)   BMI 40.78 kg/m   Specialty Comments:  No specialty comments available.  PMFS History: Patient Active Problem List   Diagnosis Date Noted  . Ischemic ulcer of lower leg due to atherosclerosis ([place]) 02/28/2019  . Essential hypertension, benign 02/06/2019  . Hypercholesteremia 02/06/2019  . CKD (chronic kidney disease) stage 3, GFR 30-59 ml/min (HCC) 10/06/2017  . Proteinuria 10/06/2017  . Type 2  diabetes mellitus with nephropathy ([place]) 10/06/2017  . 3-vessel coronary artery disease 05/23/2017  . Abnormal nuclear stress test 09/06/2016  . PAD (peripheral artery disease) ([place]) 10/28/2012  . Morbid obesity with BMI of 40.0-44.9, adult ([place]) 10/13/2012  . Diabetes mellitus with ophthalmic complication ([place]) 97/35/3299  . Hypertension associated with diabetes ([place]) 10/13/2012  . Atherosclerosis of native artery of extremity with intermittent claudication ([place]) 04/05/2012  . Adenomatous colon polyp 09/28/2011  . Pure hyperglyceridemia 09/28/2011  . Erectile dysfunction 09/28/2011  . Diabetic retinopathy ([place]) 09/28/2011   Past Medical History:  Diagnosis Date  . Chronic kidney disease    stage 3  . Colon polyp   . Coronary artery disease   . Diabetes mellitus 1987   under care of Dr. Chalmers Cater.  On insulin since 96 (off and on)  . Diabetic retinopathy   . Dupuytren contracture    R hand, s/p injection (Dr. Lenon Curt)  . Essential hypertension, benign   . Essential hypertension, benign 02/06/2019  . Frequency of urination and polyuria   . Hypertension   . Myocardial infarction [facility])    denies  . Neuromuscular disorder ([place])    Diabetic neuropathy  . Osteomyelitis ([place])    right foot  . Other testicular hypofunction   . Peripheral arterial disease ([place]) 10/28/2012  . Peritoneal abscess ([place]) 6/08   and buttock.  . Pneumonia   . Polydipsia   . Proteinuria   . Pure hyperglyceridemia   . Subacute osteomyelitis, right ankle and foot ([place])   . Wears glasses     Family History  Problem Relation Age of Onset  . Diabetes Mother   . Hearing loss Mother   . Hypertension Mother   . Hyperlipidemia Mother   . Heart disease Mother   . Varicose Veins Mother   . Varicose Veins Father   . Dementia Father   . Hyperlipidemia Brother   . Diabetes Maternal Grandmother     Past Surgical History:  Procedure Laterality Date  . ABDOMINAL AORTAGRAM N/A 04/18/2012   Procedure: ABDOMINAL  Maxcine Ham;  Surgeon: Angelia Mould, MD;  Location: [facility] CATH LAB;  Service: Cardiovascular;  Laterality: N/A;  . AMPUTATION Right 05/19/2019   Procedure: RIGHT FOOT 5TH RAY AMPUTATION;  Surgeon: Newt Minion, MD;  Location: [place];  Service: Orthopedics;  Laterality: Right;  . CARDIAC CATHETERIZATION N/A 09/08/2016   Procedure: Left Heart Cath and Coronary Angiography;  Surgeon: Adrian Prows, MD;  Location: [place] CV LAB;  Service: Cardiovascular;  Laterality: N/A;  . CATARACT EXTRACTION, BILATERAL  09/2017, 10/2017   Dr. Herbert Deaner  .  COLONOSCOPY W/ BIOPSIES AND POLYPECTOMY    . LOWER EXTREMITY ANGIOGRAM Bilateral 04/18/2012   Procedure: LOWER EXTREMITY ANGIOGRAM;  Surgeon: Angelia Mould, MD;  Location: [facility] CATH LAB;  Service: Cardiovascular;  Laterality: Bilateral;  bilat lower extrem angio  . LOWER EXTREMITY ANGIOGRAPHY Bilateral 05/02/2019   Procedure: LOWER EXTREMITY ANGIOGRAPHY;  Surgeon: Adrian Prows, MD;  Location: [place] CV LAB;  Service: Cardiovascular;  Laterality: Bilateral;  . LOWER EXTREMITY ANGIOGRAPHY Bilateral 02/28/2019   Procedure: LOWER EXTREMITY ANGIOGRAPHY;  Surgeon: Adrian Prows, MD;  Location: [place] CV LAB;  Service: Cardiovascular;  Laterality: Bilateral;  . macular photocoagulation     (eye treatments for diabetic retinopathy)-Dr. Zigmund Daniel  . PERIPHERAL VASCULAR INTERVENTION  02/28/2019   Procedure: PERIPHERAL VASCULAR INTERVENTION;  Surgeon: Adrian Prows, MD;  Location: [place] CV LAB;  Service: Cardiovascular;;   Social History   Occupational History  . 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 Smoking status: Former Smoker    Packs/day: 1.00    Years: 30.00    Pack years: 30.00    Types: Cigarettes    Quit date: 01/08/2012    Years since quitting: 7.4  . Smokeless tobacco: Never Used  Substance and Sexual Activity  . Alcohol use: Yes    Comment: rare  . Drug use: No  .  Sexual activity: Yes    Partners: Female    Birth control/protection: Condom

## 2019-06-23 ENCOUNTER — Encounter (HOSPITAL_BASED_OUTPATIENT_CLINIC_OR_DEPARTMENT_OTHER): Payer: BC Managed Care – PPO | Attending: Internal Medicine

## 2019-06-23 DIAGNOSIS — I2510 Atherosclerotic heart disease of native coronary artery without angina pectoris: Secondary | ICD-10-CM | POA: Diagnosis not present

## 2019-06-23 DIAGNOSIS — Z89421 Acquired absence of other right toe(s): Secondary | ICD-10-CM | POA: Insufficient documentation

## 2019-06-23 DIAGNOSIS — E1142 Type 2 diabetes mellitus with diabetic polyneuropathy: Secondary | ICD-10-CM | POA: Insufficient documentation

## 2019-06-23 DIAGNOSIS — L97522 Non-pressure chronic ulcer of other part of left foot with fat layer exposed: Secondary | ICD-10-CM | POA: Diagnosis not present

## 2019-06-23 DIAGNOSIS — E1151 Type 2 diabetes mellitus with diabetic peripheral angiopathy without gangrene: Secondary | ICD-10-CM | POA: Diagnosis not present

## 2019-06-23 DIAGNOSIS — E11621 Type 2 diabetes mellitus with foot ulcer: Secondary | ICD-10-CM | POA: Diagnosis not present

## 2019-06-23 DIAGNOSIS — I10 Essential (primary) hypertension: Secondary | ICD-10-CM | POA: Diagnosis not present

## 2019-07-03 ENCOUNTER — Encounter: Payer: Self-pay | Admitting: Orthopedic Surgery

## 2019-07-03 NOTE — Progress Notes (Signed)
Office Visit Note   Patient: Eduardo [name]           Date of Birth: [birth year]           MRN: [id number] Visit Date: 06/15/2019              Requested by: Rita Ohara, [place],  [place] [address] PCP: Rita Ohara, MD  Chief Complaint  Patient presents with  . Right Foot - Routine Post Op    05/19/2019 5th ray amp      HPI: Patient is a 63 year old gentleman who presents about a month status post right foot fifth ray amputation.  Patient states he has some pain currently pain is 0/10 today.  Patient states that a small area near the incision where he is been having some drainage and bleeding the sutures are intact.  Assessment & Plan: Visit Diagnoses:  1. 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  Follow-Up Instructions: Return in about 3 weeks (around 07/06/2019).   Ortho Exam  Patient is alert, oriented, no adenopathy, well-dressed, normal affect, normal respiratory effort. Examination the incision is well approximated there is no cellulitis no signs of infection.  Imaging: No results found. No images are attached to the encounter.  Labs: Lab Results  Component Value Date   HGBA1C 7.2 04/14/2019   HGBA1C 7.2 06/09/2018   HGBA1C 7.1% 05/24/2017   REPTSTATUS 05/02/2019 FINAL 04/28/2019   GRAMSTAIN  04/28/2019    NO WBC SEEN NO ORGANISMS SEEN Performed at [facility], [place] [address] [place], [place] [address]    CULT RARE ENTEROCOCCUS FAECALIS 04/28/2019   LABORGA ENTEROCOCCUS FAECALIS 04/28/2019     Lab Results  Component Value Date   ALBUMIN 3.8 04/21/2019   ALBUMIN 4.1 09/28/2011    No results found for: MG No results found for: VD25OH  No results found for: PREALBUMIN CBC EXTENDED Latest Ref Rng & Units 05/19/2019 04/21/2019 02/23/2019  WBC 4.0 - 10.5 K/uL 8.5 7.7 7.9  RBC 4.22 - 5.81  MIL/uL 4.66 4.85 5.05  HGB 13.0 - 17.0 g/dL 14.3 15.3 15.8  HCT 39.0 - 52.0 % 43.0 44.9 45.9  PLT 150 - 400 K/uL 186 198 211  NEUTROABS 1.7 - 7.7 K/uL - - -  LYMPHSABS 0.7 - 4.0 K/uL - - -     Body mass index is 40.78 kg/m.  Orders:  No orders of the defined types were placed in this encounter.  No orders of the defined types were placed in this encounter.    Procedures: No procedures performed  Clinical Data: No additional findings.  ROS:  All other systems negative, except as noted in the HPI. Review of Systems  Objective: Vital Signs: Ht 5\' 10"  (1.778 m)   Wt 284 lb 3.2 oz (128.9 kg)   BMI 40.78 kg/m   Specialty Comments:  No specialty comments available.  PMFS History: Patient Active Problem List   Diagnosis Date Noted  . Partial nontraumatic amputation of foot, right ([place]) 06/21/2019  . Ischemic ulcer of lower leg due to atherosclerosis ([place]) 02/28/2019  . Essential hypertension, benign 02/06/2019  . Hypercholesteremia 02/06/2019  . CKD (chronic kidney disease) stage 3, GFR 30-59 ml/min (HCC) 10/06/2017  . Proteinuria 10/06/2017  . Type 2 diabetes mellitus with nephropathy ([place]) 10/06/2017  . 3-vessel coronary artery disease 05/23/2017  . Abnormal nuclear  stress test 09/06/2016  . PAD (peripheral artery disease) ([place]) 10/28/2012  . Morbid obesity with BMI of 40.0-44.9, adult ([place]) 10/13/2012  . Diabetes mellitus with ophthalmic complication ([place]) Q000111Q  . Hypertension associated with diabetes ([place]) 10/13/2012  . Atherosclerosis of native artery of extremity with intermittent claudication ([place]) 04/05/2012  . Adenomatous colon polyp 09/28/2011  . Pure hyperglyceridemia 09/28/2011  . Erectile dysfunction 09/28/2011  . Diabetic retinopathy ([place]) 09/28/2011   Past Medical History:  Diagnosis Date  . Chronic kidney disease    stage 3  . Colon polyp   . Coronary artery disease   . Diabetes mellitus 1987   under care of Dr. Chalmers Cater.  On insulin since  96 (off and on)  . Diabetic retinopathy   . Dupuytren contracture    R hand, s/p injection (Dr. Lenon Curt)  . Essential hypertension, benign   . Essential hypertension, benign 02/06/2019  . Frequency of urination and polyuria   . Hypertension   . Myocardial infarction [facility])    denies  . Neuromuscular disorder ([place])    Diabetic neuropathy  . Osteomyelitis ([place])    right foot  . Other testicular hypofunction   . Peripheral arterial disease ([place]) 10/28/2012  . Peritoneal abscess ([place]) 6/08   and buttock.  . Pneumonia   . Polydipsia   . Proteinuria   . Pure hyperglyceridemia   . Subacute osteomyelitis, right ankle and foot ([place])   . Wears glasses     Family History  Problem Relation Age of Onset  . Diabetes Mother   . Hearing loss Mother   . Hypertension Mother   . Hyperlipidemia Mother   . Heart disease Mother   . Varicose Veins Mother   . Varicose Veins Father   . Dementia Father   . Hyperlipidemia Brother   . Diabetes Maternal Grandmother     Past Surgical History:  Procedure Laterality Date  . ABDOMINAL AORTAGRAM N/A 04/18/2012   Procedure: ABDOMINAL Maxcine Ham;  Surgeon: Angelia Mould, MD;  Location: [facility] CATH LAB;  Service: Cardiovascular;  Laterality: N/A;  . AMPUTATION Right 05/19/2019   Procedure: RIGHT FOOT 5TH RAY AMPUTATION;  Surgeon: Newt Minion, MD;  Location: [place];  Service: Orthopedics;  Laterality: Right;  . CARDIAC CATHETERIZATION N/A 09/08/2016   Procedure: Left Heart Cath and Coronary Angiography;  Surgeon: Adrian Prows, MD;  Location: [place] CV LAB;  Service: Cardiovascular;  Laterality: N/A;  . CATARACT EXTRACTION, BILATERAL  09/2017, 10/2017   Dr. Herbert Deaner  . COLONOSCOPY W/ BIOPSIES AND POLYPECTOMY    . LOWER EXTREMITY ANGIOGRAM Bilateral 04/18/2012   Procedure: LOWER EXTREMITY ANGIOGRAM;  Surgeon: Angelia Mould, MD;  Location: [facility] CATH LAB;  Service: Cardiovascular;  Laterality: Bilateral;  bilat lower extrem angio  . LOWER EXTREMITY  ANGIOGRAPHY Bilateral 05/02/2019   Procedure: LOWER EXTREMITY ANGIOGRAPHY;  Surgeon: Adrian Prows, MD;  Location: [place] CV LAB;  Service: Cardiovascular;  Laterality: Bilateral;  . LOWER EXTREMITY ANGIOGRAPHY Bilateral 02/28/2019   Procedure: LOWER EXTREMITY ANGIOGRAPHY;  Surgeon: Adrian Prows, MD;  Location: [place] CV LAB;  Service: Cardiovascular;  Laterality: Bilateral;  . macular photocoagulation     (eye treatments for diabetic retinopathy)-Dr. Zigmund Daniel  . PERIPHERAL VASCULAR INTERVENTION  02/28/2019   Procedure: PERIPHERAL VASCULAR INTERVENTION;  Surgeon: Adrian Prows, MD;  Location: [place] CV LAB;  Service: Cardiovascular;;   Social History   Occupational History  . 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  Smoking status: Former Smoker    Packs/day: 1.00    Years: 30.00    Pack years: 30.00    Types: Cigarettes    Quit date: 01/08/2012    Years since quitting: 7.4  . Smokeless tobacco: Never Used  Substance and Sexual Activity  . Alcohol use: Yes    Comment: rare  . Drug use: No  . Sexual activity: Yes    Partners: Female    Birth control/protection: Condom

## 2019-07-04 ENCOUNTER — Other Ambulatory Visit: Payer: Self-pay

## 2019-07-04 ENCOUNTER — Encounter: Payer: Self-pay | Admitting: Family Medicine

## 2019-07-04 ENCOUNTER — Ambulatory Visit (INDEPENDENT_AMBULATORY_CARE_PROVIDER_SITE_OTHER): Payer: BC Managed Care – PPO | Admitting: Family Medicine

## 2019-07-04 VITALS — Temp 101.2°F | Wt 284.0 lb

## 2019-07-04 DIAGNOSIS — Z20822 Contact with and (suspected) exposure to covid-19: Secondary | ICD-10-CM

## 2019-07-04 DIAGNOSIS — R6889 Other general symptoms and signs: Secondary | ICD-10-CM | POA: Diagnosis not present

## 2019-07-04 NOTE — Progress Notes (Signed)
   Subjective:    Patient ID: Eduardo [name], male    DOB: 23-Aug-1956, 63 y.o.   MRN: [id number]  HPI Documentation for virtual telephone encounter.  Documentation for virtual audio and video telecommunications through WebEx encounter: The patient was located at home. The provider was located in the office. The patient did consent to this visit and is aware of possible charges through their insurance for this visit. The other persons participating in this telemedicine service were none. Time spent on call was 6 minutes  This virtual service is not related to other E/M service within previous 7 days. He states that on Sunday he developed a fever of up to 101 with some nasal congestion and fatigue but no sore throat, cough, shortness of breath, smell or taste difference.  He was previously tested for COVID prior to his surgery and was negative.     Review of Systems     Objective:   Physical Exam Alert and in no distress otherwise not examined       Assessment & Plan:  Suspected Covid-19 Virus Infection I recommend that he go get tested.  He is to keep track of his temperature.  He will take Tylenol for fever, aches and pains.  He is to stay isolated until we get the results back.  Someone is living with him and I explained that that person needs to be quarantined until we have the results.

## 2019-07-05 LAB — NOVEL CORONAVIRUS, NAA: SARS-CoV-2, NAA: NOT DETECTED

## 2019-07-06 ENCOUNTER — Encounter: Payer: Self-pay | Admitting: Orthopedic Surgery

## 2019-07-06 ENCOUNTER — Ambulatory Visit (INDEPENDENT_AMBULATORY_CARE_PROVIDER_SITE_OTHER): Payer: BC Managed Care – PPO | Admitting: Orthopedic Surgery

## 2019-07-06 VITALS — Ht 70.0 in | Wt 284.0 lb

## 2019-07-06 DIAGNOSIS — Z89421 Acquired absence of other right toe(s): Secondary | ICD-10-CM

## 2019-07-07 ENCOUNTER — Encounter: Payer: Self-pay | Admitting: Orthopedic Surgery

## 2019-07-07 DIAGNOSIS — T8189XA Other complications of procedures, not elsewhere classified, initial encounter: Secondary | ICD-10-CM | POA: Diagnosis not present

## 2019-07-07 DIAGNOSIS — E1142 Type 2 diabetes mellitus with diabetic polyneuropathy: Secondary | ICD-10-CM | POA: Diagnosis not present

## 2019-07-07 DIAGNOSIS — I10 Essential (primary) hypertension: Secondary | ICD-10-CM | POA: Diagnosis not present

## 2019-07-07 DIAGNOSIS — E11621 Type 2 diabetes mellitus with foot ulcer: Secondary | ICD-10-CM | POA: Diagnosis not present

## 2019-07-07 DIAGNOSIS — Z89421 Acquired absence of other right toe(s): Secondary | ICD-10-CM | POA: Diagnosis not present

## 2019-07-07 DIAGNOSIS — I2510 Atherosclerotic heart disease of native coronary artery without angina pectoris: Secondary | ICD-10-CM | POA: Diagnosis not present

## 2019-07-07 DIAGNOSIS — L97522 Non-pressure chronic ulcer of other part of left foot with fat layer exposed: Secondary | ICD-10-CM | POA: Diagnosis not present

## 2019-07-07 DIAGNOSIS — E1151 Type 2 diabetes mellitus with diabetic peripheral angiopathy without gangrene: Secondary | ICD-10-CM | POA: Diagnosis not present

## 2019-07-07 DIAGNOSIS — S91302A Unspecified open wound, left foot, initial encounter: Secondary | ICD-10-CM | POA: Diagnosis not present

## 2019-07-07 NOTE — Progress Notes (Signed)
Office Visit Note   Patient: Eduardo [name]           Date of Birth: 1956/10/12           MRN: [id number] Visit Date: 07/06/2019              Requested by: Rita Ohara, [place],  [place] [address] PCP: Rita Ohara, MD  Chief Complaint  Patient presents with  . Right Foot - Routine Post Op    05/19/19 right 5th ray amputation       HPI: Patient is a 63 year old gentleman who presents 6 weeks status post right foot fifth ray amputation he is currently full weightbearing in regular shoewear he is using Neosporin over the incision doxycycline twice a day.  Assessment & Plan: Visit Diagnoses:  1. 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  Follow-Up Instructions: Return in about 4 weeks (around 08/03/2019).   Ortho Exam  Patient is alert, oriented, no adenopathy, well-dressed, normal affect, normal respiratory effort. Examination the wound is gaped open about 3 mm there is good granulation tissue there is no odor no cellulitis no drainage no signs of infection.  Imaging: No results found. No images are attached to the encounter.  Labs: Lab Results  Component Value Date   HGBA1C 7.2 04/14/2019   HGBA1C 7.2 06/09/2018   HGBA1C 7.1% 05/24/2017   REPTSTATUS 05/02/2019 FINAL 04/28/2019   GRAMSTAIN  04/28/2019    NO WBC SEEN NO ORGANISMS SEEN Performed at [facility], [place] [address] [place], [place] [address]    CULT RARE ENTEROCOCCUS FAECALIS 04/28/2019   LABORGA ENTEROCOCCUS FAECALIS 04/28/2019     Lab Results  Component Value Date   ALBUMIN 3.8 04/21/2019   ALBUMIN 4.1 09/28/2011    No results found for: MG No results found for: VD25OH  No results found for: PREALBUMIN CBC EXTENDED Latest Ref Rng & Units 05/19/2019 04/21/2019 02/23/2019  WBC 4.0 - 10.5 K/uL 8.5 7.7 7.9  RBC 4.22 - 5.81 MIL/uL 4.66 4.85 5.05  HGB  13.0 - 17.0 g/dL 14.3 15.3 15.8  HCT 39.0 - 52.0 % 43.0 44.9 45.9  PLT 150 - 400 K/uL 186 198 211  NEUTROABS 1.7 - 7.7 K/uL - - -  LYMPHSABS 0.7 - 4.0 K/uL - - -     Body mass index is 40.75 kg/m.  Orders:  No orders of the defined types were placed in this encounter.  No orders of the defined types were placed in this encounter.    Procedures: No procedures performed  Clinical Data: No additional findings.  ROS:  All other systems negative, except as noted in the HPI. Review of Systems  Objective: Vital Signs: Ht 5\' 10"  (1.778 m)   Wt 284 lb (128.8 kg)   BMI 40.75 kg/m   Specialty Comments:  No specialty comments available.  PMFS History: Patient Active Problem List   Diagnosis Date Noted  . Partial nontraumatic amputation of foot, right ([place]) 06/21/2019  . Ischemic ulcer of lower leg due to atherosclerosis ([place]) 02/28/2019  . Essential hypertension, benign 02/06/2019  . Hypercholesteremia 02/06/2019  . CKD (chronic kidney disease) stage 3, GFR 30-59 ml/min (HCC) 10/06/2017  . Proteinuria 10/06/2017  . Type 2 diabetes mellitus with nephropathy ([place]) 10/06/2017  . 3-vessel coronary artery disease 05/23/2017  . Abnormal nuclear stress test 09/06/2016  . PAD (  peripheral artery disease) ([place]) 10/28/2012  . Morbid obesity with BMI of 40.0-44.9, adult ([place]) 10/13/2012  . Diabetes mellitus with ophthalmic complication ([place]) Q000111Q  . Hypertension associated with diabetes ([place]) 10/13/2012  . Atherosclerosis of native artery of extremity with intermittent claudication ([place]) 04/05/2012  . Adenomatous colon polyp 09/28/2011  . Pure hyperglyceridemia 09/28/2011  . Erectile dysfunction 09/28/2011  . Diabetic retinopathy ([place]) 09/28/2011   Past Medical History:  Diagnosis Date  . Chronic kidney disease    stage 3  . Colon polyp   . Coronary artery disease   . Diabetes mellitus 1987   under care of Dr. Chalmers Cater.  On insulin since 96 (off and on)  . Diabetic  retinopathy   . Dupuytren contracture    R hand, s/p injection (Dr. Lenon Curt)  . Essential hypertension, benign   . Essential hypertension, benign 02/06/2019  . Frequency of urination and polyuria   . Hypertension   . Myocardial infarction [facility])    denies  . Neuromuscular disorder ([place])    Diabetic neuropathy  . Osteomyelitis ([place])    right foot  . Other testicular hypofunction   . Peripheral arterial disease ([place]) 10/28/2012  . Peritoneal abscess ([place]) 6/08   and buttock.  . Pneumonia   . Polydipsia   . Proteinuria   . Pure hyperglyceridemia   . Subacute osteomyelitis, right ankle and foot ([place])   . Wears glasses     Family History  Problem Relation Age of Onset  . Diabetes Mother   . Hearing loss Mother   . Hypertension Mother   . Hyperlipidemia Mother   . Heart disease Mother   . Varicose Veins Mother   . Varicose Veins Father   . Dementia Father   . Hyperlipidemia Brother   . Diabetes Maternal Grandmother     Past Surgical History:  Procedure Laterality Date  . ABDOMINAL AORTAGRAM N/A 04/18/2012   Procedure: ABDOMINAL Maxcine Ham;  Surgeon: Angelia Mould, MD;  Location: [facility] CATH LAB;  Service: Cardiovascular;  Laterality: N/A;  . AMPUTATION Right 05/19/2019   Procedure: RIGHT FOOT 5TH RAY AMPUTATION;  Surgeon: Newt Minion, MD;  Location: [place];  Service: Orthopedics;  Laterality: Right;  . CARDIAC CATHETERIZATION N/A 09/08/2016   Procedure: Left Heart Cath and Coronary Angiography;  Surgeon: Adrian Prows, MD;  Location: [place] CV LAB;  Service: Cardiovascular;  Laterality: N/A;  . CATARACT EXTRACTION, BILATERAL  09/2017, 10/2017   Dr. Herbert Deaner  . COLONOSCOPY W/ BIOPSIES AND POLYPECTOMY    . LOWER EXTREMITY ANGIOGRAM Bilateral 04/18/2012   Procedure: LOWER EXTREMITY ANGIOGRAM;  Surgeon: Angelia Mould, MD;  Location: [facility] CATH LAB;  Service: Cardiovascular;  Laterality: Bilateral;  bilat lower extrem angio  . LOWER EXTREMITY ANGIOGRAPHY Bilateral 05/02/2019    Procedure: LOWER EXTREMITY ANGIOGRAPHY;  Surgeon: Adrian Prows, MD;  Location: [place] CV LAB;  Service: Cardiovascular;  Laterality: Bilateral;  . LOWER EXTREMITY ANGIOGRAPHY Bilateral 02/28/2019   Procedure: LOWER EXTREMITY ANGIOGRAPHY;  Surgeon: Adrian Prows, MD;  Location: [place] CV LAB;  Service: Cardiovascular;  Laterality: Bilateral;  . macular photocoagulation     (eye treatments for diabetic retinopathy)-Dr. Zigmund Daniel  . PERIPHERAL VASCULAR INTERVENTION  02/28/2019   Procedure: PERIPHERAL VASCULAR INTERVENTION;  Surgeon: Adrian Prows, MD;  Location: [place] CV LAB;  Service: Cardiovascular;;   Social History   Occupational History  . 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 Smoking status: Former Smoker  Packs/day: 1.00    Years: 30.00    Pack years: 30.00    Types: Cigarettes    Quit date: 01/08/2012    Years since quitting: 7.4  . Smokeless tobacco: Never Used  Substance and Sexual Activity  . Alcohol use: Yes    Comment: rare  . Drug use: No  . Sexual activity: Yes    Partners: Female    Birth control/protection: Condom

## 2019-07-28 ENCOUNTER — Other Ambulatory Visit: Payer: Self-pay

## 2019-07-28 MED ORDER — CLOPIDOGREL BISULFATE 75 MG PO TABS: 75.0000 mg | ORAL_TABLET | Freq: Every day | ORAL | 1 refills | Status: AC

## 2019-08-03 ENCOUNTER — Encounter: Payer: Self-pay | Admitting: Orthopedic Surgery

## 2019-08-03 ENCOUNTER — Ambulatory Visit (INDEPENDENT_AMBULATORY_CARE_PROVIDER_SITE_OTHER): Payer: BC Managed Care – PPO | Admitting: Orthopedic Surgery

## 2019-08-03 VITALS — Ht 70.0 in | Wt 284.0 lb

## 2019-08-03 DIAGNOSIS — E1121 Type 2 diabetes mellitus with diabetic nephropathy: Secondary | ICD-10-CM

## 2019-08-03 DIAGNOSIS — Z89421 Acquired absence of other right toe(s): Secondary | ICD-10-CM

## 2019-08-03 NOTE — Progress Notes (Signed)
Office Visit Note   Patient: Eduardo [name]           Date of Birth: 08-14-1956           MRN: [id number] Visit Date: 08/03/2019              Requested by: Rita Ohara, [place],  [place] [address] PCP: Rita Ohara, MD  Chief Complaint  Patient presents with  . Right Foot - Routine Post Op    05/19/19 right 5th ray amputation       HPI: Patient is a 63 year old gentleman who presents 10 weeks status post right foot fifth ray amputation.  He is currently ambulating in new balance sneakers he has no concerns.  Assessment & Plan: Visit Diagnoses:  1. 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 Type 2 diabetes mellitus with nephropathy ([place])     Plan: Patient was given instructions to continue working on Achilles stretching recommended compression stockings for the venous insufficiency.  Follow-Up Instructions: Return in about 4 weeks (around 08/31/2019).   Ortho Exam  Patient is alert, oriented, no adenopathy, well-dressed, normal affect, normal respiratory effort. Examination patient has pitting edema of the right lower extremity but no cellulitis no open ulcers.  He has a palpable dorsalis pedis pulse the fifth ray amputation is well-healed there is venous swelling in the foot as well.  He does have a's slight equinus contracture with about a 10 degrees equinus contracture with his knee extended.  Imaging: No results found. No images are attached to the encounter.  Labs: Lab Results  Component Value Date   HGBA1C 7.2 04/14/2019   HGBA1C 7.2 06/09/2018   HGBA1C 7.1% 05/24/2017   REPTSTATUS 05/02/2019 FINAL 04/28/2019   GRAMSTAIN  04/28/2019    NO WBC SEEN NO ORGANISMS SEEN Performed at [facility], [place] [address] [place], [place] [address]    CULT RARE ENTEROCOCCUS FAECALIS 04/28/2019   LABORGA ENTEROCOCCUS FAECALIS 04/28/2019     Lab Results  Component Value Date   ALBUMIN 3.8 04/21/2019   ALBUMIN 4.1  09/28/2011    No results found for: MG No results found for: VD25OH  No results found for: PREALBUMIN CBC EXTENDED Latest Ref Rng & Units 05/19/2019 04/21/2019 02/23/2019  WBC 4.0 - 10.5 K/uL 8.5 7.7 7.9  RBC 4.22 - 5.81 MIL/uL 4.66 4.85 5.05  HGB 13.0 - 17.0 g/dL 14.3 15.3 15.8  HCT 39.0 - 52.0 % 43.0 44.9 45.9  PLT 150 - 400 K/uL 186 198 211  NEUTROABS 1.7 - 7.7 K/uL - - -  LYMPHSABS 0.7 - 4.0 K/uL - - -     Body mass index is 40.75 kg/m.  Orders:  No orders of the defined types were placed in this encounter.  No orders of the defined types were placed in this encounter.    Procedures: No procedures performed  Clinical Data: No additional findings.  ROS:  All other systems negative, except as noted in the HPI. Review of Systems  Objective: Vital Signs: Ht 5\' 10"  (1.778 m)   Wt 284 lb (128.8 kg)   BMI 40.75 kg/m   Specialty Comments:  No specialty comments available.  PMFS History: Patient Active Problem List   Diagnosis Date Noted  . Partial nontraumatic amputation of foot, right ([place]) 06/21/2019  . Ischemic ulcer of lower leg due to atherosclerosis ([place]) 02/28/2019  . Essential hypertension, benign 02/06/2019  . Hypercholesteremia 02/06/2019  .  CKD (chronic kidney disease) stage 3, GFR 30-59 ml/min (HCC) 10/06/2017  . Proteinuria 10/06/2017  . Type 2 diabetes mellitus with nephropathy ([place]) 10/06/2017  . 3-vessel coronary artery disease 05/23/2017  . Abnormal nuclear stress test 09/06/2016  . PAD (peripheral artery disease) ([place]) 10/28/2012  . Morbid obesity with BMI of 40.0-44.9, adult ([place]) 10/13/2012  . Diabetes mellitus with ophthalmic complication ([place]) Q000111Q  . Hypertension associated with diabetes ([place]) 10/13/2012  . Atherosclerosis of native artery of extremity with intermittent claudication ([place]) 04/05/2012  . Adenomatous colon polyp 09/28/2011  . Pure hyperglyceridemia 09/28/2011  . Erectile dysfunction 09/28/2011  . Diabetic  retinopathy ([place]) 09/28/2011   Past Medical History:  Diagnosis Date  . Chronic kidney disease    stage 3  . Colon polyp   . Coronary artery disease   . Diabetes mellitus 1987   under care of Dr. Chalmers Cater.  On insulin since 96 (off and on)  . Diabetic retinopathy   . Dupuytren contracture    R hand, s/p injection (Dr. Lenon Curt)  . Essential hypertension, benign   . Essential hypertension, benign 02/06/2019  . Frequency of urination and polyuria   . Hypertension   . Myocardial infarction [facility])    denies  . Neuromuscular disorder ([place])    Diabetic neuropathy  . Osteomyelitis ([place])    right foot  . Other testicular hypofunction   . Peripheral arterial disease ([place]) 10/28/2012  . Peritoneal abscess ([place]) 6/08   and buttock.  . Pneumonia   . Polydipsia   . Proteinuria   . Pure hyperglyceridemia   . Subacute osteomyelitis, right ankle and foot ([place])   . Wears glasses     Family History  Problem Relation Age of Onset  . Diabetes Mother   . Hearing loss Mother   . Hypertension Mother   . Hyperlipidemia Mother   . Heart disease Mother   . Varicose Veins Mother   . Varicose Veins Father   . Dementia Father   . Hyperlipidemia Brother   . Diabetes Maternal Grandmother     Past Surgical History:  Procedure Laterality Date  . ABDOMINAL AORTAGRAM N/A 04/18/2012   Procedure: ABDOMINAL Maxcine Ham;  Surgeon: Angelia Mould, MD;  Location: [facility] CATH LAB;  Service: Cardiovascular;  Laterality: N/A;  . AMPUTATION Right 05/19/2019   Procedure: RIGHT FOOT 5TH RAY AMPUTATION;  Surgeon: Newt Minion, MD;  Location: [place];  Service: Orthopedics;  Laterality: Right;  . CARDIAC CATHETERIZATION N/A 09/08/2016   Procedure: Left Heart Cath and Coronary Angiography;  Surgeon: Adrian Prows, MD;  Location: [place] CV LAB;  Service: Cardiovascular;  Laterality: N/A;  . CATARACT EXTRACTION, BILATERAL  09/2017, 10/2017   Dr. Herbert Deaner  . COLONOSCOPY W/ BIOPSIES AND POLYPECTOMY    . LOWER EXTREMITY  ANGIOGRAM Bilateral 04/18/2012   Procedure: LOWER EXTREMITY ANGIOGRAM;  Surgeon: Angelia Mould, MD;  [facility] LAB;  Service: Cardiovascular;  Laterality: Bilateral;  bilat lower extrem angio  . LOWER EXTREMITY ANGIOGRAPHY Bilateral 05/02/2019   Procedure: LOWER EXTREMITY ANGIOGRAPHY;  Surgeon: Adrian Prows, MD;  Location: [place] CV LAB;  Service: Cardiovascular;  Laterality: Bilateral;  . LOWER EXTREMITY ANGIOGRAPHY Bilateral 02/28/2019   Procedure: LOWER EXTREMITY ANGIOGRAPHY;  Surgeon: Adrian Prows, MD;  Location: [place] CV LAB;  Service: Cardiovascular;  Laterality: Bilateral;  . macular photocoagulation     (eye treatments for diabetic retinopathy)-Dr. Zigmund Daniel  . PERIPHERAL VASCULAR INTERVENTION  02/28/2019   Procedure: PERIPHERAL VASCULAR INTERVENTION;  Surgeon: Adrian Prows, MD;  Location: [place] CV LAB;  Service: Cardiovascular;;   Social History   Occupational History  . 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 Smoking status: Former Smoker    Packs/day: 1.00    Years: 30.00    Pack years: 30.00    Types: Cigarettes    Quit date: 01/08/2012    Years since quitting: 7.5  . Smokeless tobacco: Never Used  Substance and Sexual Activity  . Alcohol use: Yes    Comment: rare  . Drug use: No  . Sexual activity: Yes    Partners: Female    Birth control/protection: Condom

## 2019-08-09 ENCOUNTER — Ambulatory Visit: Payer: BC Managed Care – PPO

## 2019-08-09 ENCOUNTER — Other Ambulatory Visit: Payer: Self-pay

## 2019-08-09 DIAGNOSIS — I739 Peripheral vascular disease, unspecified: Secondary | ICD-10-CM | POA: Diagnosis not present

## 2019-08-21 DIAGNOSIS — E7800 Pure hypercholesterolemia, unspecified: Secondary | ICD-10-CM | POA: Diagnosis not present

## 2019-08-21 DIAGNOSIS — Z23 Encounter for immunization: Secondary | ICD-10-CM | POA: Diagnosis not present

## 2019-08-21 DIAGNOSIS — I10 Essential (primary) hypertension: Secondary | ICD-10-CM | POA: Diagnosis not present

## 2019-08-21 DIAGNOSIS — E1165 Type 2 diabetes mellitus with hyperglycemia: Secondary | ICD-10-CM | POA: Diagnosis not present

## 2019-08-21 DIAGNOSIS — E139 Other specified diabetes mellitus without complications: Secondary | ICD-10-CM | POA: Diagnosis not present

## 2019-08-31 ENCOUNTER — Other Ambulatory Visit: Payer: Self-pay

## 2019-08-31 ENCOUNTER — Encounter: Payer: Self-pay | Admitting: Orthopedic Surgery

## 2019-08-31 ENCOUNTER — Ambulatory Visit (INDEPENDENT_AMBULATORY_CARE_PROVIDER_SITE_OTHER): Payer: BC Managed Care – PPO | Admitting: Orthopedic Surgery

## 2019-08-31 VITALS — Ht 70.0 in | Wt 284.0 lb

## 2019-08-31 DIAGNOSIS — Z89421 Acquired absence of other right toe(s): Secondary | ICD-10-CM

## 2019-08-31 DIAGNOSIS — L97511 Non-pressure chronic ulcer of other part of right foot limited to breakdown of skin: Secondary | ICD-10-CM | POA: Diagnosis not present

## 2019-09-05 ENCOUNTER — Encounter: Payer: Self-pay | Admitting: Orthopedic Surgery

## 2019-09-05 NOTE — Progress Notes (Signed)
Office Visit Note   Patient: Eduardo [name]           Date of Birth: Oct 14, 1956           MRN: [id number] Visit Date: 08/31/2019              Requested by: Rita Ohara, [place],  [place] [address] PCP: Rita Ohara, MD  Chief Complaint  Patient presents with  . Right Foot - Routine Post Op    05/19/2019 right foot 5th ray      HPI: Patient is a 63 year old gentleman who is 3 months status post a right foot fifth ray amputation.  He states he is working on Achilles stretching and wearing his compression stocking.  He states that he has an ulcer over the base of the fifth metatarsal.  Assessment & Plan: Visit Diagnoses:  1. 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  Patient will continue on working with heel cord stretching.  Follow-Up Instructions: Return in about 4 weeks (around 09/28/2019).   Ortho Exam  Patient is alert, oriented, no adenopathy, well-dressed, normal affect, normal respiratory effort. Examination patient has Achilles contracture with dorsiflexion only to neutral.  He has a Wagner grade 1 ulcer beneath the base of the fifth metatarsal right foot.  He has a good pulse.  After informed consent a 10 blade knife was used to debride the skin and soft tissue back to healthy viable granulation tissue this was a touch with silver nitrate.  There is no cellulitis no drainage no exposed bone or tendon no signs of infection.  The ulcer is 10 x 20 mm and 1 mm deep.  Imaging: No results found. No images are attached to the encounter.  Labs: Lab Results  Component Value Date   HGBA1C 7.2 04/14/2019   HGBA1C 7.2 06/09/2018   HGBA1C 7.1% 05/24/2017   REPTSTATUS 05/02/2019 FINAL 04/28/2019   GRAMSTAIN  04/28/2019    NO WBC SEEN NO ORGANISMS SEEN Performed at [facility], [place] [address] [place],  [address]    CULT RARE ENTEROCOCCUS FAECALIS 04/28/2019   LABORGA ENTEROCOCCUS FAECALIS 04/28/2019     Lab Results  Component Value Date   ALBUMIN 3.8 04/21/2019   ALBUMIN 4.1 09/28/2011    No results found for: MG No results found for: VD25OH  No results found for: PREALBUMIN CBC EXTENDED Latest Ref Rng & Units 05/19/2019 04/21/2019 02/23/2019  WBC 4.0 - 10.5 K/uL 8.5 7.7 7.9  RBC 4.22 - 5.81 MIL/uL 4.66 4.85 5.05  HGB 13.0 - 17.0 g/dL 14.3 15.3 15.8  HCT 39.0 - 52.0 % 43.0 44.9 45.9  PLT 150 - 400 K/uL 186 198 211  NEUTROABS 1.7 - 7.7 K/uL - - -  LYMPHSABS 0.7 - 4.0 K/uL - - -     Body mass index is 40.75 kg/m.  Orders:  No orders of the defined types were placed in this encounter.  No orders of the defined types were placed in this encounter.    Procedures: No procedures performed  Clinical Data: No additional findings.  ROS:  All other systems negative, except as noted in the HPI. Review of Systems  Objective: Vital Signs: Ht 5\' 10"  (1.778 m)   Wt 284 lb (128.8 kg)  BMI 40.75 kg/m   Specialty Comments:  No specialty comments available.  PMFS History: Patient Active Problem List   Diagnosis Date Noted  . Partial nontraumatic amputation of foot, right ([place]) 06/21/2019  . Ischemic ulcer of lower leg due to atherosclerosis ([place]) 02/28/2019  . Essential hypertension, benign 02/06/2019  . Hypercholesteremia 02/06/2019  . CKD (chronic kidney disease) stage 3, GFR 30-59 ml/min 10/06/2017  . Proteinuria 10/06/2017  . Type 2 diabetes mellitus with nephropathy ([place]) 10/06/2017  . 3-vessel coronary artery disease 05/23/2017  . Abnormal nuclear stress test 09/06/2016  . PAD (peripheral artery disease) ([place]) 10/28/2012  . Morbid obesity with BMI of 40.0-44.9, adult ([place]) 10/13/2012  . Diabetes mellitus with ophthalmic complication ([place]) Q000111Q  . Hypertension associated with diabetes ([place]) 10/13/2012  .  Atherosclerosis of native artery of extremity with intermittent claudication ([place]) 04/05/2012  . Adenomatous colon polyp 09/28/2011  . Pure hyperglyceridemia 09/28/2011  . Erectile dysfunction 09/28/2011  . Diabetic retinopathy ([place]) 09/28/2011   Past Medical History:  Diagnosis Date  . Chronic kidney disease    stage 3  . Colon polyp   . Coronary artery disease   . Diabetes mellitus 1987   under care of Dr. Chalmers Cater.  On insulin since 96 (off and on)  . Diabetic retinopathy   . Dupuytren contracture    R hand, s/p injection (Dr. Lenon Curt)  . Essential hypertension, benign   . Essential hypertension, benign 02/06/2019  . Frequency of urination and polyuria   . Hypertension   . Myocardial infarction [facility])    denies  . Neuromuscular disorder ([place])    Diabetic neuropathy  . Osteomyelitis ([place])    right foot  . Other testicular hypofunction   . Peripheral arterial disease ([place]) 10/28/2012  . Peritoneal abscess ([place]) 6/08   and buttock.  . Pneumonia   . Polydipsia   . Proteinuria   . Pure hyperglyceridemia   . Subacute osteomyelitis, right ankle and foot ([place])   . Wears glasses     Family History  Problem Relation Age of Onset  . Diabetes Mother   . Hearing loss Mother   . Hypertension Mother   . Hyperlipidemia Mother   . Heart disease Mother   . Varicose Veins Mother   . Varicose Veins Father   . Dementia Father   . Hyperlipidemia Brother   . Diabetes Maternal Grandmother     Past Surgical History:  Procedure Laterality Date  . ABDOMINAL AORTAGRAM N/A 04/18/2012   Procedure: ABDOMINAL Maxcine Ham;  Surgeon: Angelia Mould, MD;  Location: [facility] CATH LAB;  Service: Cardiovascular;  Laterality: N/A;  . AMPUTATION Right 05/19/2019   Procedure: RIGHT FOOT 5TH RAY AMPUTATION;  Surgeon: Newt Minion, MD;  Location: [place];  Service: Orthopedics;  Laterality: Right;  . CARDIAC CATHETERIZATION N/A 09/08/2016   Procedure: Left Heart Cath and Coronary Angiography;  Surgeon: Adrian Prows, MD;  Location: [place] CV LAB;  Service: Cardiovascular;  Laterality: N/A;  . CATARACT EXTRACTION, BILATERAL  09/2017, 10/2017   Dr. Herbert Deaner  . COLONOSCOPY W/ BIOPSIES AND POLYPECTOMY    . LOWER EXTREMITY ANGIOGRAM Bilateral 04/18/2012   Procedure: LOWER EXTREMITY ANGIOGRAM;  Surgeon: Angelia Mould, MD;  Location: [facility] CATH LAB;  Service: Cardiovascular;  Laterality: Bilateral;  bilat lower extrem angio  . LOWER EXTREMITY ANGIOGRAPHY Bilateral 05/02/2019   Procedure: LOWER EXTREMITY ANGIOGRAPHY;  Surgeon: Adrian Prows, MD;  Location: [place] CV LAB;  Service: Cardiovascular;  Laterality: Bilateral;  . LOWER EXTREMITY ANGIOGRAPHY Bilateral  02/28/2019   Procedure: LOWER EXTREMITY ANGIOGRAPHY;  Surgeon: Adrian Prows, MD;  Location: [place] CV LAB;  Service: Cardiovascular;  Laterality: Bilateral;  . macular photocoagulation     (eye treatments for diabetic retinopathy)-Dr. Zigmund Daniel  . PERIPHERAL VASCULAR INTERVENTION  02/28/2019   Procedure: PERIPHERAL VASCULAR INTERVENTION;  Surgeon: Adrian Prows, MD;  Location: [place] CV LAB;  Service: Cardiovascular;;   Social History   Occupational History  . 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 Smoking status: Former Smoker    Packs/day: 1.00    Years: 30.00    Pack years: 30.00    Types: Cigarettes    Quit date: 01/08/2012    Years since quitting: 7.6  . Smokeless tobacco: Never Used  Substance and Sexual Activity  . Alcohol use: Yes    Comment: rare  . Drug use: No  . Sexual activity: Yes    Partners: Female    Birth control/protection: Condom

## 2019-09-08 DIAGNOSIS — N183 Chronic kidney disease, stage 3 unspecified: Secondary | ICD-10-CM | POA: Diagnosis not present

## 2019-09-08 DIAGNOSIS — N2581 Secondary hyperparathyroidism of renal origin: Secondary | ICD-10-CM | POA: Diagnosis not present

## 2019-09-08 DIAGNOSIS — I129 Hypertensive chronic kidney disease with stage 1 through stage 4 chronic kidney disease, or unspecified chronic kidney disease: Secondary | ICD-10-CM | POA: Diagnosis not present

## 2019-09-08 DIAGNOSIS — I739 Peripheral vascular disease, unspecified: Secondary | ICD-10-CM | POA: Diagnosis not present

## 2019-09-08 DIAGNOSIS — N1831 Chronic kidney disease, stage 3a: Secondary | ICD-10-CM | POA: Diagnosis not present

## 2019-09-28 ENCOUNTER — Encounter: Payer: Self-pay | Admitting: Orthopedic Surgery

## 2019-09-28 ENCOUNTER — Ambulatory Visit (INDEPENDENT_AMBULATORY_CARE_PROVIDER_SITE_OTHER): Payer: BC Managed Care – PPO | Admitting: Orthopedic Surgery

## 2019-09-28 ENCOUNTER — Other Ambulatory Visit: Payer: Self-pay

## 2019-09-28 VITALS — Ht 70.0 in | Wt 284.0 lb

## 2019-09-28 DIAGNOSIS — Z89421 Acquired absence of other right toe(s): Secondary | ICD-10-CM

## 2019-09-28 NOTE — Progress Notes (Signed)
Office Visit Note   Patient: Eduardo [name]           Date of Birth: August 13, 1956           MRN: [id number] Visit Date: 09/28/2019              Requested by: Rita Ohara, [place],  [place] [address] PCP: Rita Ohara, MD  Chief Complaint  Patient presents with  . Right Foot - Follow-up    05/19/19 right foot 5th ray amputation       HPI: Patient presents 4 months s/p right 5th ray amputation. He is doing well. He still has a small eschar on the lateral side of his foot that can have some drainage. There is no foul odor.   Assessment & Plan: Visit Diagnoses: No diagnosis found.  Plan: Continue with Compression socks. Follow up 4 weeks  Follow-Up Instructions: No follow-ups on file.   Ortho Exam  Patient is alert, oriented, no adenopathy, well-dressed, normal affect, normal respiratory effort. Right foot: Palpable pulse toes are warm and pink. Callus trimmed to healthy granulation bed. No surrounding cellulitis or drainage  Imaging: No results found. No images are attached to the encounter.  Labs: Lab Results  Component Value Date   HGBA1C 7.2 04/14/2019   HGBA1C 7.2 06/09/2018   HGBA1C 7.1% 05/24/2017   REPTSTATUS 05/02/2019 FINAL 04/28/2019   GRAMSTAIN  04/28/2019    NO WBC SEEN NO ORGANISMS SEEN Performed at [facility], [place] [address] [place], [place] [address]    CULT RARE ENTEROCOCCUS FAECALIS 04/28/2019   LABORGA ENTEROCOCCUS FAECALIS 04/28/2019     Lab Results  Component Value Date   ALBUMIN 3.8 04/21/2019   ALBUMIN 4.1 09/28/2011    No results found for: MG No results found for: VD25OH  No results found for: PREALBUMIN CBC EXTENDED Latest Ref Rng & Units 05/19/2019 04/21/2019 02/23/2019  WBC 4.0 - 10.5 K/uL 8.5 7.7 7.9  RBC 4.22 - 5.81 MIL/uL 4.66 4.85 5.05  HGB 13.0 - 17.0 g/dL 14.3 15.3 15.8  HCT 39.0 - 52.0 % 43.0 44.9 45.9  PLT 150 - 400 K/uL 186 198 211  NEUTROABS 1.7 - 7.7 K/uL - - -  LYMPHSABS 0.7 - 4.0 K/uL -  - -     Body mass index is 40.75 kg/m.  Orders:  No orders of the defined types were placed in this encounter.  No orders of the defined types were placed in this encounter.    Procedures: No procedures performed  Clinical Data: No additional findings.  ROS:  All other systems negative, except as noted in the HPI. Review of Systems  Objective: Vital Signs: Ht 5\' 10"  (1.778 m)   Wt 284 lb (128.8 kg)   BMI 40.75 kg/m   Specialty Comments:  No specialty comments available.  PMFS History: Patient Active Problem List   Diagnosis Date Noted  . Partial nontraumatic amputation of foot, right ([place]) 06/21/2019  . Ischemic ulcer of lower leg due to atherosclerosis ([place]) 02/28/2019  . Essential hypertension, benign 02/06/2019  . Hypercholesteremia 02/06/2019  . CKD (chronic kidney disease) stage 3, GFR 30-59 ml/min 10/06/2017  . Proteinuria 10/06/2017  . Type 2 diabetes mellitus with nephropathy ([place]) 10/06/2017  . 3-vessel coronary artery disease 05/23/2017  . Abnormal nuclear stress test 09/06/2016  . PAD (peripheral artery disease) ([place]) 10/28/2012  . Morbid obesity with BMI of 40.0-44.9, adult ([place]) 10/13/2012  . Diabetes mellitus with ophthalmic complication ([place]) Q000111Q  .  Hypertension associated with diabetes ([place]) 10/13/2012  . Atherosclerosis of native artery of extremity with intermittent claudication ([place]) 04/05/2012  . Adenomatous colon polyp 09/28/2011  . Pure hyperglyceridemia 09/28/2011  . Erectile dysfunction 09/28/2011  . Diabetic retinopathy ([place]) 09/28/2011   Past Medical History:  Diagnosis Date  . Chronic kidney disease    stage 3  . Colon polyp   . Coronary artery disease   . Diabetes mellitus 1987   under care of Dr. Chalmers Cater.  On insulin since 96 (off and on)  . Diabetic retinopathy   . Dupuytren contracture    R hand, s/p injection (Dr. Lenon Curt)  . Essential hypertension, benign   . Essential hypertension, benign 02/06/2019  . Frequency  of urination and polyuria   . Hypertension   . Myocardial infarction [facility])    denies  . Neuromuscular disorder ([place])    Diabetic neuropathy  . Osteomyelitis ([place])    right foot  . Other testicular hypofunction   . Peripheral arterial disease ([place]) 10/28/2012  . Peritoneal abscess ([place]) 6/08   and buttock.  . Pneumonia   . Polydipsia   . Proteinuria   . Pure hyperglyceridemia   . Subacute osteomyelitis, right ankle and foot ([place])   . Wears glasses     Family History  Problem Relation Age of Onset  . Diabetes Mother   . Hearing loss Mother   . Hypertension Mother   . Hyperlipidemia Mother   . Heart disease Mother   . Varicose Veins Mother   . Varicose Veins Father   . Dementia Father   . Hyperlipidemia Brother   . Diabetes Maternal Grandmother     Past Surgical History:  Procedure Laterality Date  . ABDOMINAL AORTAGRAM N/A 04/18/2012   Procedure: ABDOMINAL Maxcine Ham;  Surgeon: Angelia Mould, MD;  Location: [facility] CATH LAB;  Service: Cardiovascular;  Laterality: N/A;  . AMPUTATION Right 05/19/2019   Procedure: RIGHT FOOT 5TH RAY AMPUTATION;  Surgeon: Newt Minion, MD;  Location: [place];  Service: Orthopedics;  Laterality: Right;  . CARDIAC CATHETERIZATION N/A 09/08/2016   Procedure: Left Heart Cath and Coronary Angiography;  Surgeon: Adrian Prows, MD;  Location: [place] CV LAB;  Service: Cardiovascular;  Laterality: N/A;  . CATARACT EXTRACTION, BILATERAL  09/2017, 10/2017   Dr. Herbert Deaner  . COLONOSCOPY W/ BIOPSIES AND POLYPECTOMY    . LOWER EXTREMITY ANGIOGRAM Bilateral 04/18/2012   Procedure: LOWER EXTREMITY ANGIOGRAM;  Surgeon: Angelia Mould, MD;  Location: [facility] CATH LAB;  Service: Cardiovascular;  Laterality: Bilateral;  bilat lower extrem angio  . LOWER EXTREMITY ANGIOGRAPHY Bilateral 05/02/2019   Procedure: LOWER EXTREMITY ANGIOGRAPHY;  Surgeon: Adrian Prows, MD;  Location: [place] CV LAB;  Service: Cardiovascular;  Laterality: Bilateral;  . LOWER EXTREMITY  ANGIOGRAPHY Bilateral 02/28/2019   Procedure: LOWER EXTREMITY ANGIOGRAPHY;  Surgeon: Adrian Prows, MD;  Location: [place] CV LAB;  Service: Cardiovascular;  Laterality: Bilateral;  . macular photocoagulation     (eye treatments for diabetic retinopathy)-Dr. Zigmund Daniel  . PERIPHERAL VASCULAR INTERVENTION  02/28/2019   Procedure: PERIPHERAL VASCULAR INTERVENTION;  Surgeon: Adrian Prows, MD;  Location: [place] CV LAB;  Service: Cardiovascular;;   Social History   Occupational History  . 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 Smoking status: Former Smoker    Packs/day: 1.00    Years: 30.00    Pack years: 30.00    Types: Cigarettes    Quit date: 01/08/2012  Years since quitting: 7.7  . Smokeless tobacco: Never Used  Substance and Sexual Activity  . Alcohol use: Yes    Comment: rare  . Drug use: No  . Sexual activity: Yes    Partners: Female    Birth control/protection: Condom

## 2019-10-16 ENCOUNTER — Encounter (INDEPENDENT_AMBULATORY_CARE_PROVIDER_SITE_OTHER): Payer: BC Managed Care – PPO | Admitting: Ophthalmology

## 2019-10-16 DIAGNOSIS — E113391 Type 2 diabetes mellitus with moderate nonproliferative diabetic retinopathy without macular edema, right eye: Secondary | ICD-10-CM

## 2019-10-16 DIAGNOSIS — I10 Essential (primary) hypertension: Secondary | ICD-10-CM | POA: Diagnosis not present

## 2019-10-16 DIAGNOSIS — E11311 Type 2 diabetes mellitus with unspecified diabetic retinopathy with macular edema: Secondary | ICD-10-CM

## 2019-10-16 DIAGNOSIS — H43813 Vitreous degeneration, bilateral: Secondary | ICD-10-CM

## 2019-10-16 DIAGNOSIS — E113512 Type 2 diabetes mellitus with proliferative diabetic retinopathy with macular edema, left eye: Secondary | ICD-10-CM

## 2019-10-16 DIAGNOSIS — H35033 Hypertensive retinopathy, bilateral: Secondary | ICD-10-CM

## 2019-10-23 ENCOUNTER — Other Ambulatory Visit: Payer: Self-pay

## 2019-10-23 MED ORDER — ROSUVASTATIN CALCIUM 20 MG PO TABS: 20.0000 mg | ORAL_TABLET | Freq: Every day | ORAL | 1 refills | Status: DC

## 2019-10-26 ENCOUNTER — Encounter: Payer: Self-pay | Admitting: Orthopedic Surgery

## 2019-10-26 ENCOUNTER — Other Ambulatory Visit: Payer: Self-pay

## 2019-10-26 ENCOUNTER — Ambulatory Visit (INDEPENDENT_AMBULATORY_CARE_PROVIDER_SITE_OTHER): Payer: BC Managed Care – PPO | Admitting: Orthopedic Surgery

## 2019-10-26 VITALS — Ht 70.0 in | Wt 284.0 lb

## 2019-10-26 DIAGNOSIS — Z89421 Acquired absence of other right toe(s): Secondary | ICD-10-CM

## 2019-10-30 ENCOUNTER — Encounter: Payer: Self-pay | Admitting: Orthopedic Surgery

## 2019-10-30 NOTE — Progress Notes (Signed)
Office Visit Note   Patient: Eduardo [name]           Date of Birth: February 23, 1956           MRN: [id number] Visit Date: 10/26/2019              Requested by: Rita Ohara, [place],  [place] [id number] PCP: Rita Ohara, MD  Chief Complaint  Patient presents with  . Right Foot - Follow-up    05/19/19 right 5th ray amputation       HPI: Patient is a 63 year old gentleman who presents status post right foot fifth ray amputation on May 19, 2019.  Patient is currently in regular shoes complains of mild pain at the area of the incision.  States he has occasional bleeding.  Assessment & Plan: Visit Diagnoses:  1. 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  Follow-Up Instructions: Return in about 4 weeks (around 11/23/2019).   Ortho Exam  Patient is alert, oriented, no adenopathy, well-dressed, normal affect, normal respiratory effort. Examination patient has good pulses he has a cavovarus foot with a plantarflexed first ray ambulating on the lateral aspect of his foot with an ulcer beneath the base of the fifth metatarsal.  After informed consent a 10 blade knife was used to debride the skin and soft tissue back to healthy viable granulation tissue this was touched with silver nitrate the ulcer is 3 cm in diameter 1 mm deep.  Imaging: No results found. No images are attached to the encounter.  Labs: Lab Results  Component Value Date   HGBA1C 7.2 04/14/2019   HGBA1C 7.2 06/09/2018   HGBA1C 7.1% 05/24/2017   REPTSTATUS 05/02/2019 FINAL 04/28/2019   GRAMSTAIN  04/28/2019    NO WBC SEEN NO ORGANISMS SEEN Performed at [facility], [place] [address], [place], [place] [address]    CULT RARE ENTEROCOCCUS FAECALIS 04/28/2019   LABORGA ENTEROCOCCUS FAECALIS 04/28/2019      Lab Results  Component Value Date   ALBUMIN 3.8 04/21/2019   ALBUMIN 4.1 09/28/2011    No results found for: MG No results found for: VD25OH  No results found for: PREALBUMIN CBC EXTENDED Latest Ref Rng & Units 05/19/2019 04/21/2019 02/23/2019  WBC 4.0 - 10.5 K/uL 8.5 7.7 7.9  RBC 4.22 - 5.81 MIL/uL 4.66 4.85 5.05  HGB 13.0 - 17.0 g/dL 14.3 15.3 15.8  HCT 39.0 - 52.0 % 43.0 44.9 45.9  PLT 150 - 400 K/uL 186 198 211  NEUTROABS 1.7 - 7.7 K/uL - - -  LYMPHSABS 0.7 - 4.0 K/uL - - -     Body mass index is 40.75 kg/m.  Orders:  No orders of the defined types were placed in this encounter.  No orders of the defined types were placed in this encounter.    Procedures: No procedures performed  Clinical Data: No additional findings.  ROS:  All other systems negative, except as noted in the HPI. Review of Systems  Objective: Vital Signs: Ht 5\' 10"  (1.778 m)   Wt 284 lb (128.8 kg)   BMI 40.75 kg/m   Specialty Comments:  No specialty comments available.  PMFS History: Patient Active Problem List   Diagnosis Date Noted  . Partial nontraumatic amputation of foot,  right ([place]) 06/21/2019  . Ischemic ulcer of lower leg due to atherosclerosis ([place]) 02/28/2019  . Essential hypertension, benign 02/06/2019  . Hypercholesteremia 02/06/2019  . CKD (chronic kidney disease) stage 3, GFR 30-59 ml/min 10/06/2017  . Proteinuria 10/06/2017  . Type 2 diabetes mellitus with nephropathy ([place]) 10/06/2017  . 3-vessel coronary artery disease 05/23/2017  . Abnormal nuclear stress test 09/06/2016  . PAD (peripheral artery disease) ([place]) 10/28/2012  . Morbid obesity with BMI of 40.0-44.9, adult ([place]) 10/13/2012  . Diabetes mellitus with ophthalmic complication ([place]) [id number]  . Hypertension associated with diabetes ([place]) 10/13/2012  . Atherosclerosis of native artery of extremity with intermittent claudication ([place]) 04/05/2012  . Adenomatous colon polyp 09/28/2011  . Pure  hyperglyceridemia 09/28/2011  . Erectile dysfunction 09/28/2011  . Diabetic retinopathy ([place]) 09/28/2011   Past Medical History:  Diagnosis Date  . Chronic kidney disease    stage 3  . Colon polyp   . Coronary artery disease   . Diabetes mellitus 1987   under care of Dr. Chalmers Cater.  On insulin since 96 (off and on)  . Diabetic retinopathy   . Dupuytren contracture    R hand, s/p injection (Dr. Lenon Curt)  . Essential hypertension, benign   . Essential hypertension, benign 02/06/2019  . Frequency of urination and polyuria   . Hypertension   . Myocardial infarction [facility])    denies  . Neuromuscular disorder ([place])    Diabetic neuropathy  . Osteomyelitis ([place])    right foot  . Other testicular hypofunction   . Peripheral arterial disease ([place]) 10/28/2012  . Peritoneal abscess ([place]) 6/08   and buttock.  . Pneumonia   . Polydipsia   . Proteinuria   . Pure hyperglyceridemia   . Subacute osteomyelitis, right ankle and foot ([place])   . Wears glasses     Family History  Problem Relation Age of Onset  . Diabetes Mother   . Hearing loss Mother   . Hypertension Mother   . Hyperlipidemia Mother   . Heart disease Mother   . Varicose Veins Mother   . Varicose Veins Father   . Dementia Father   . Hyperlipidemia Brother   . Diabetes Maternal Grandmother     Past Surgical History:  Procedure Laterality Date  . ABDOMINAL AORTAGRAM N/A 04/18/2012   Procedure: ABDOMINAL Maxcine Ham;  Surgeon: Angelia Mould, MD;  Location: [facility] CATH LAB;  Service: Cardiovascular;  Laterality: N/A;  . AMPUTATION Right 05/19/2019   Procedure: RIGHT FOOT 5TH RAY AMPUTATION;  Surgeon: Newt Minion, MD;  Location: [facility];  Service: Orthopedics;  Laterality: Right;  . CARDIAC CATHETERIZATION N/A 09/08/2016   Procedure: Left Heart Cath and Coronary Angiography;  Surgeon: Adrian Prows, MD;  Location: [place] CV LAB;  Service: Cardiovascular;  Laterality: N/A;  . CATARACT EXTRACTION, BILATERAL  09/2017, 10/2017    Dr. Herbert Deaner  . COLONOSCOPY W/ BIOPSIES AND POLYPECTOMY    . LOWER EXTREMITY ANGIOGRAM Bilateral 04/18/2012   Procedure: LOWER EXTREMITY ANGIOGRAM;  Surgeon: Angelia Mould, MD;  Location: [facility] CATH LAB;  Service: Cardiovascular;  Laterality: Bilateral;  bilat lower extrem angio  . LOWER EXTREMITY ANGIOGRAPHY Bilateral 05/02/2019   Procedure: LOWER EXTREMITY ANGIOGRAPHY;  Surgeon: Adrian Prows, MD;  Location: [facility] CV LAB;  Service: Cardiovascular;  Laterality: Bilateral;  . LOWER EXTREMITY ANGIOGRAPHY Bilateral 02/28/2019   Procedure: LOWER EXTREMITY ANGIOGRAPHY;  Surgeon: Adrian Prows, MD;  Location: [place] CV LAB;  Service: Cardiovascular;  Laterality: Bilateral;  . macular photocoagulation     (  eye treatments for diabetic retinopathy)-Dr. Zigmund Daniel  . PERIPHERAL VASCULAR INTERVENTION  02/28/2019   Procedure: PERIPHERAL VASCULAR INTERVENTION;  Surgeon: Adrian Prows, MD;  Location: [place] CV LAB;  Service: Cardiovascular;;   Social History   Occupational History  . 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 Smoking status: Former Smoker    Packs/day: 1.00    Years: 30.00    Pack years: 30.00    Types: Cigarettes    Quit date: 01/08/2012    Years since quitting: 7.8  . Smokeless tobacco: Never Used  Substance and Sexual Activity  . Alcohol use: Yes    Comment: rare  . Drug use: No  . Sexual activity: Yes    Partners: Female    Birth control/protection: Condom

## 2019-11-08 ENCOUNTER — Ambulatory Visit (INDEPENDENT_AMBULATORY_CARE_PROVIDER_SITE_OTHER): Payer: BC Managed Care – PPO | Admitting: Cardiology

## 2019-11-08 ENCOUNTER — Encounter: Payer: Self-pay | Admitting: Cardiology

## 2019-11-08 ENCOUNTER — Other Ambulatory Visit: Payer: Self-pay

## 2019-11-08 VITALS — BP 124/64 | HR 63 | Temp 97.2°F | Ht 70.0 in | Wt 290.9 lb

## 2019-11-08 DIAGNOSIS — N1831 Chronic kidney disease, stage 3a: Secondary | ICD-10-CM

## 2019-11-08 DIAGNOSIS — I739 Peripheral vascular disease, unspecified: Secondary | ICD-10-CM

## 2019-11-08 DIAGNOSIS — I2510 Atherosclerotic heart disease of native coronary artery without angina pectoris: Secondary | ICD-10-CM | POA: Diagnosis not present

## 2019-11-08 DIAGNOSIS — E1151 Type 2 diabetes mellitus with diabetic peripheral angiopathy without gangrene: Secondary | ICD-10-CM | POA: Diagnosis not present

## 2019-11-08 DIAGNOSIS — E7800 Pure hypercholesterolemia, unspecified: Secondary | ICD-10-CM

## 2019-11-08 DIAGNOSIS — I129 Hypertensive chronic kidney disease with stage 1 through stage 4 chronic kidney disease, or unspecified chronic kidney disease: Secondary | ICD-10-CM | POA: Diagnosis not present

## 2019-11-08 DIAGNOSIS — I10 Essential (primary) hypertension: Secondary | ICD-10-CM

## 2019-11-08 NOTE — Progress Notes (Signed)
Primary Physician/Referring:  Rita Ohara, MD  Patient ID: Eduardo [name], male    DOB: 1956-08-14, 63 y.o.   MRN: [id number]  Chief Complaint  Patient presents with  . Coronary Artery Disease  . PAD  . Follow-up    73mo  HPI:    Eduardo [name] is a 63y.o. Caucasian male  with controlled type 2 diabetes, hypertension, hyperlipidemia, coronary artery disease by angiography on 09/08/2016 revealing severe triple-vessel CAD with no targets for CABG, presently on aggressive medical therapy, mild carotid atherosclerosis, right SFA occlusion S/P complex PV angiogram on 02/28/2019 with PTA to occluded right SFA with implantation of 3 overlapping 6.0 x 120 x2; 6.0 x 100 mm Eluvia DES for critical right leg ischemia now resolved. He has diabetic right foot ulcer being treated by Dr. DSharol Given He has mild disease in the left leg.  He is presently doing well, has not had any chest pain or dyspnea, diabetes is improving.  Right foot ulcer is gradually healing. No PND or orthopnea, no leg edema.  Past Medical History:  Diagnosis Date  . Chronic kidney disease    stage 3  . Colon polyp   . Coronary artery disease   . Diabetes mellitus 1987   under care of Dr. BChalmers Cater  On insulin since 96 (off and on)  . Diabetic retinopathy   . Dupuytren contracture    R hand, s/p injection (Dr. CLenon Curt  . Essential hypertension, benign   . Essential hypertension, benign 02/06/2019  . Frequency of urination and polyuria   . Hypertension   . Myocardial infarction ([facility]    denies  . Neuromuscular disorder ([place]    Diabetic neuropathy  . Osteomyelitis ([place]    right foot  . Other testicular hypofunction   . Peripheral arterial disease ([place] 10/28/2012  . Peritoneal abscess ([place] 6/08   and buttock.  . Pneumonia   . Polydipsia   . Proteinuria   . Pure hyperglyceridemia   . Subacute osteomyelitis, right ankle and foot ([place]   . Wears glasses    Past Surgical History:  Procedure Laterality Date  . ABDOMINAL  AORTAGRAM N/A 04/18/2012   Procedure: ABDOMINAL AMaxcine Ham  Surgeon: CAngelia Mould MD;  [facility];  Service: Cardiovascular;  Laterality: N/A;  . AMPUTATION Right 05/19/2019   Procedure: RIGHT FOOT 5TH RAY AMPUTATION;  Surgeon: DNewt Minion MD;  [place]  Service: Orthopedics;  Laterality: Right;  . CARDIAC CATHETERIZATION N/A 09/08/2016   Procedure: Left Heart Cath and Coronary Angiography;  Surgeon: JAdrian Prows MD;  [place] [facility];  Service: Cardiovascular;  Laterality: N/A;  . CATARACT EXTRACTION, BILATERAL  09/2017, 10/2017   Dr. HHerbert Deaner . COLONOSCOPY W/ BIOPSIES AND POLYPECTOMY    . LOWER EXTREMITY ANGIOGRAM Bilateral 04/18/2012   Procedure: LOWER EXTREMITY ANGIOGRAM;  Surgeon: CAngelia Mould MD;  [facility];  Service: Cardiovascular;  Laterality: Bilateral;  bilat lower extrem angio  . LOWER EXTREMITY ANGIOGRAPHY Bilateral 05/02/2019   Procedure: LOWER EXTREMITY ANGIOGRAPHY;  Surgeon: GAdrian Prows MD;  [facility];  Service: Cardiovascular;  Laterality: Bilateral;  . LOWER EXTREMITY ANGIOGRAPHY Bilateral 02/28/2019   Procedure: LOWER EXTREMITY ANGIOGRAPHY;  Surgeon: GAdrian Prows MD;  [place] [place];  Service: Cardiovascular;  Laterality: Bilateral;  . macular photocoagulation     (eye treatments for diabetic retinopathy)-Dr. MZigmund Daniel . PERIPHERAL VASCULAR INTERVENTION  02/28/2019   Procedure: PERIPHERAL VASCULAR INTERVENTION;  Surgeon: GEinar Gip  Ulice Dash, MD;  Location: [place] CV LAB;  Service: Cardiovascular;;   Social History   Socioeconomic History  . Marital status: Widowed    Spouse name: Not on file  . Number of children: 0  . Years of education: Not on file  . Highest education level: Not on file  Occupational History  . 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 Smoking status: Former Smoker    Packs/day: 1.00    Years:  30.00    Pack years: 30.00    Types: Cigarettes    Quit date: 01/08/2012    Years since quitting: 7.8  . Smokeless tobacco: Never Used  Substance and Sexual Activity  . Alcohol use: Yes    Comment: rare  . Drug use: No  . Sexual activity: Yes    Partners: Female    Birth control/protection: Condom  Other Topics Concern  . Not on file  Social History Narrative   Widowed.    Girlfriends stays with him frequently (since surgery 05/2019).   Previously traveled 40 weeks out of the year. No longer travels at all (since COVID none, cut back on travel in 2019). No pets   Working from home.   Social Determinants of Health   Financial Resource Strain:   . Difficulty of Paying Living Expenses: Not on file  Food Insecurity:   . Worried About Charity fundraiser in the Last Year: Not on file  . Ran Out of Food in the Last Year: Not on file  Transportation Needs:   . Lack of Transportation (Medical): Not on file  . Lack of Transportation (Non-Medical): Not on file  Physical Activity:   . Days of Exercise per Week: Not on file  . Minutes of Exercise per Session: Not on file  Stress:   . Feeling of Stress : Not on file  Social Connections:   . Frequency of Communication with Friends and Family: Not on file  . Frequency of Social Gatherings with Friends and Family: Not on file  . Attends Religious Services: Not on file  . Active Member of Clubs or Organizations: Not on file  . Attends Archivist Meetings: Not on file  . Marital Status: Not on file  Intimate Partner Violence:   . Fear of Current or Ex-Partner: Not on file  . Emotionally Abused: Not on file  . Physically Abused: Not on file  . Sexually Abused: Not on file   ROS  Review of Systems  Constitution: Negative for chills, decreased appetite, malaise/fatigue and weight gain.  Cardiovascular: Positive for claudication (minimal. Ulcer rght and left foot) and dyspnea on exertion (stable). Negative for leg swelling and  syncope.  Endocrine: Negative for cold intolerance.  Hematologic/Lymphatic: Does not bruise/bleed easily.  Musculoskeletal: Negative for joint swelling.  Gastrointestinal: Negative for abdominal pain, anorexia and change in bowel habit.  Neurological: Negative for headaches and light-headedness.  Psychiatric/Behavioral: Negative for depression and substance abuse.  All other systems reviewed and are negative.  Objective  Blood pressure 124/64, pulse 63, temperature (!) 97.2 F (36.2 C), height _0  (1.778 m), weight 290 lb 14.4 oz (132 kg), SpO2 98 %.  Vitals with BMI 11/08/2019 10/26/2019 09/28/2019  Height _1  _2  _3   Weight 290 lbs 14 oz 284 lbs 284 lbs  BMI 41.74 83.15 17.61  Systolic 607 - -  Diastolic 64 - -  Pulse 63 - -  Physical Exam  Constitutional: He appears well-developed. No distress.  Morbidly obese  HENT:  Head: Atraumatic.  Eyes: Conjunctivae are normal.  Neck: No thyromegaly present.  Short neck and difficult to evaluate JVP  Cardiovascular: Exam reveals no gallop.  No murmur heard. Pulses:      Carotid pulses are 2+ on the right side and 2+ on the left side.      Dorsalis pedis pulses are 2+ on the right side and 2+ on the left side.       Posterior tibial pulses are 0 on the right side and 0 on the left side.  Femoral and popliteal pulse difficult to feel due to patient's body habitus. Neuropathic ulcer right sole of the foot slowly healing and chronic.  Pulmonary/Chest: Effort normal.  Abdominal: Soft.  Obese.   Musculoskeletal:        General: No edema. Normal range of motion.     Cervical back: Neck supple.  Neurological: He is alert.  Skin: Skin is dry.  Psychiatric: He has a normal mood and affect.   Laboratory examination:   Recent Labs    02/23/19 1253 04/21/19 0912 05/19/19 0935  NA 142 139 137  K 4.6 4.9 4.4  CL 103 104 107  CO2 24 22 19*  GLUCOSE 85 140* 112*  BUN 32* 31* 35*  CREATININE 1.40* 1.31* 1.27*    CALCIUM 9.2 8.6 8.5*  GFRNONAA 53* 58* 60*  GFRAA 61 67 >60   CrCl cannot be calculated (Patient's most recent lab result is older than the maximum 21 days allowed.).  CMP Latest Ref Rng & Units 05/19/2019 04/21/2019 02/23/2019  Glucose 70 - 99 mg/dL 112(H) 140(H) 85  BUN 8 - 23 mg/dL 35(H) 31(H) 32(H)  Creatinine 0.61 - 1.24 mg/dL 1.27(H) 1.31(H) 1.40(H)  Sodium 135 - 145 mmol/L 137 139 142  Potassium 3.5 - 5.1 mmol/L 4.4 4.9 4.6  Chloride 98 - 111 mmol/L 107 104 103  CO2 22 - 32 mmol/L 19(L) 22 24  Calcium 8.9 - 10.3 mg/dL 8.5(L) 8.6 9.2  Total Protein 6.0 - 8.5 g/dL - 5.8(L) -  Total Bilirubin 0.0 - 1.2 mg/dL - 0.4 -  Alkaline Phos 39 - 117 IU/L - 68 -  AST 0 - 40 IU/L - 21 -  ALT 0 - 44 IU/L - 20 -   CBC Latest Ref Rng & Units 05/19/2019 04/21/2019 02/23/2019  WBC 4.0 - 10.5 K/uL 8.5 7.7 7.9  Hemoglobin 13.0 - 17.0 g/dL 14.3 15.3 15.8  Hematocrit 39.0 - 52.0 % 43.0 44.9 45.9  Platelets 150 - 400 K/uL 186 198 211   Lipid Panel     Component Value Date/Time   CHOL 108 04/21/2019 0912   TRIG 102 04/21/2019 0912   HDL 33 (L) 04/21/2019 0912   CHOLHDL 4.4 09/28/2011 1358   VLDL 21 09/28/2011 1358   LDLCALC 55 04/21/2019 0912   HEMOGLOBIN A1C Lab Results  Component Value Date   HGBA1C 7.2 04/14/2019   MPG 192 (H) 09/28/2011   TSH Recent Labs    06/12/19 1000  TSH 2.250    01/19/2018: Cholesterol 115, triglycerides 112, HDL 39, LDL 54.  Glucose 125, creatinine 1.16, potassium 5.2, EGFR 67, CMP otherwise normal.  CBC normal.  TSH 1.5.  Medications and allergies   Allergies  Allergen Reactions  . Food     Mussels=nausea/vomiting     Current Outpatient Medications  Medication Instructions  . B-D UF III MINI PEN NEEDLES 31G X 5 MM  MISC U UTD 6 TIMES A DAY  . Bioflavonoid Products (ESTER-C) 1000-50 MG TABS 1 tablet, Oral, Daily  . Calcium Alginate-Silver (ANTIBACTERIAL ALGINATE W/SILV) 4.25"X4.25" PADS 1 patch, Transdermal, Daily, Applied to wounds of each foot  .  Continuous Blood Gluc Sensor (FREESTYLE LIBRE 14 DAY SENSOR) MISC USE UTD Q 14 DAYS SUBCUTANEOUS  . furosemide (LASIX) 20 mg, Oral, Daily  . gemfibrozil (LOPID) 600 mg, 2 times daily before meals  . HumaLOG KwikPen 0-20 Units, Subcutaneous, 2 times daily, SLIDING SCALE DEPENDING ON BLOOD GLUCOSE BEFORE MEAL  . HumuLIN N KwikPen 30-40 Units, Subcutaneous, See admin instructions, Inject 45 units subcutaneously in the morning & 35 units in the evening.  . isosorbide mononitrate (IMDUR) 60 MG 24 hr tablet TAKE 1 TABLET BY MOUTH DAILY  . Krill Oil 350 mg, Oral, Daily, Move Free Ultra Krill Oil  . lisinopril (ZESTRIL) 10 mg, Oral, Daily  . metoprolol succinate (TOPROL-XL) 100 mg, Oral, Daily, Take with or immediately following a meal.  . Multiple Vitamin (MULTIVITAMIN WITH MINERALS) TABS tablet 1 tablet, Oral, Daily  . nitroGLYCERIN (NITROSTAT) 0.4 mg, Sublingual, Every 5 min PRN  . Probiotic Product (PROBIOTIC-10 PO) 1 capsule, Oral, Daily  . rosuvastatin (CRESTOR) 20 mg, Oral, Daily  . Vitamin D 2,000 Units, Oral, Daily    Radiology:  No results found.  Cardiac Studies:   Coronary angiogram 09/08/2016: Low normal LVEF, 44-50% with apical akinesis. Occluded mid LAD, D2 mod sized 80% stenosis, OM1 bifurcates, occluded in the proximal segment. Large dominant RCA. 4 PL branches, large PL 1 with 80% stenosis, large PL 3 long segment occlusion. All CTO's have ipsilateral and contralateral faint collaterals. Calcification.   Echocardiogram 09/01/2016: Left ventricle cavity is normal in size. Moderate concentric hypertrophy of the left ventricle. Mild to moderately reduced left ventricular function. Left ventricle regional wall motion findings: Poor echo window and reduced sensitivity.  There is inferior hypokinesis, lateral hypokinesis, mild global hypokinesis. Grade 1 diastolic dysfunction.  Calculated EF 40%. Left atrial cavity is mildly dilated at 4.2 cm. IVC is dilated with poor inspiration  collapse consistent with elevated right atrial pressure.   Exercise sestamibi stress test 08/17/2016: 1. Resting EKG demonstrates normal sinus rhythm, left axis deviation, left plantar fascicular block.  Poor R-wave progression, ulnar disease pattern.  Nonspecific ST-T abnormality, cannot exclude lateral ischemia.  Stress EKG is equivocal for ischemia, patient developing atypical left otherwise for with exercise which reverted back to baseline immediately on combination of the stress test less than 60 seconds.  There was no additional ST-T wave changes of ischemia. Patient exercised on Bruce protocol for 4:25 minutes and achieved 5.45 METS. Stress test terminated due to 89 % MPHR achieved (Target HR >85%). Symptoms included dizziness.  2.  2-Day protocol followed. Perfusion imaging studies demonstrate large sized severe perfusion defect involving the inferior, anterior and anterolateral consistent with inferior wall scar with moderate peri-infarct ischemia and severe anterior and anteroapical reversible ischemia extending from the base towards the apex.  Left ventricular systolic function calculating by QGS was markedly depressed at 26%.  This is a high risk study, consider further cardiac work-up.   Carotid artery duplex 02/16/2018: No hemodynamically significant arterial disease in the internal carotid artery bilaterally. Mild heterogenous plaque noted.  Antegrade right vertebral artery flow. Antegrade left vertebral artery flow. Compared to the study done on 09/01/2016, left carotid noted as occlusion is an error. This may perhaps be due to low velocity noted in both carotid arteries and may indicate low  systemic BP or reduced cardiac output. Clinical correlation recommended.  Peripheral arteriogram 02/28/2019: Pelvic aortogram with limited bifemoral arteriogram revealed patent distal abdominal aorta without aneurysm, patent, bilateral iliac and common femoral vessels. Right SFA is occluded in the  ostium and reconstitutes outside of the Hunter's canal.  Below the right knee there is two-vessel runoff, AT is occluded.  Left leg was not studied beyond left common femoral artery. Successful PTA and stenting of right SFA, prolonged procedure, difficult procedure with use of multiple balloons, guidewires and catheters.  100% stenosis reduced to 0% with implantation of 3 overlapping 6.0 x 120 x2; 6.0 x 100 mm Eluvia DES. 180 mL contrast utilized.  Peripheral arteriogram 05/02/2019: Right common femoral artery and right proximal SFA previously placed stent widely patent.  Right iliac artery shows mild disease. Left iliac artery and left femoral arteries show very mild disease.  There is two-vessel runoff in the left leg, left AT is occluded. Intermediate stenosis noted in the left distal and proximal SFA, pressure pullback reveals no significant gradient.   ABI 08/09/2019: This exam reveals normal perfusion of the right lower extremity (ABI). This exam reveals normal perfusion of the left lower extremity (ABI).  The ABI may be falsely elevated due to medial calcinosis from diabetes. No significant change since 05/11/2019. Patient has h/o right SFA stenting.     Assessment     ICD-10-CM   1. Peripheral artery disease (HCC)  I73.9   2. 3-vessel coronary artery disease  I25.10 EKG 12-Lead  3. DM (diabetes mellitus), type 2 with peripheral vascular complications (HCC)  J00.93   4. Essential hypertension, benign  I10   5. Hypercholesteremia  E78.00   6. Stage 3a chronic kidney disease  N18.31     EKG 11/08/2019: Normal sinus rhythm at rate of 61 bpm, left axis deviation, left Anterior fascicular block.  Anterolateral infarct old.  Nonspecific T abnormality.  Low-voltage complexes. No significant change from 08/01/2018.  Recommendations:  No orders of the defined types were placed in this encounter.   Eduardo [name]  is a 63 y.o. Caucasian male  with controlled type 2 diabetes, hypertension,  hyperlipidemia, coronary artery disease by angiography on 09/08/2016 revealing severe triple-vessel CAD with no targets for CABG, presently on aggressive medical therapy, mild carotid atherosclerosis, right SFA occlusion S/P complex PV angiogram on 02/28/2019 with PTA to occluded right SFA with implantation of 3 overlapping 6.0 x 120 x2; 6.0 x 100 mm Eluvia DES for critical right leg ischemia now resolved. He has diabetic right foot ulcer being treated by Dr. Sharol Given. He has mild disease in the left leg.  He is doing well from [place] standpoint with good control of his lipids, hypertension, hyperlipidemia.  Weight loss was again discussed with the patient.  I have encouraged him to look into wellness clinic at [facility].  No changes in the medications were done today.  I will see him back in 6 months.  Patient is being screened for the LPA trial "HERITAGE".  Adrian Prows, MD, [facility] 11/08/2019, 6:26 PM [facility] [place] Pager: [phone] Office: [phone]

## 2019-11-20 DIAGNOSIS — E7800 Pure hypercholesterolemia, unspecified: Secondary | ICD-10-CM | POA: Diagnosis not present

## 2019-11-20 DIAGNOSIS — I10 Essential (primary) hypertension: Secondary | ICD-10-CM | POA: Diagnosis not present

## 2019-11-20 DIAGNOSIS — E139 Other specified diabetes mellitus without complications: Secondary | ICD-10-CM | POA: Diagnosis not present

## 2019-11-20 DIAGNOSIS — E1165 Type 2 diabetes mellitus with hyperglycemia: Secondary | ICD-10-CM | POA: Diagnosis not present

## 2019-11-27 ENCOUNTER — Ambulatory Visit (INDEPENDENT_AMBULATORY_CARE_PROVIDER_SITE_OTHER): Payer: BC Managed Care – PPO | Admitting: Orthopedic Surgery

## 2019-11-27 ENCOUNTER — Encounter: Payer: Self-pay | Admitting: Orthopedic Surgery

## 2019-11-27 ENCOUNTER — Other Ambulatory Visit: Payer: Self-pay

## 2019-11-27 VITALS — Ht 70.0 in | Wt 290.0 lb

## 2019-11-27 DIAGNOSIS — I739 Peripheral vascular disease, unspecified: Secondary | ICD-10-CM | POA: Diagnosis not present

## 2019-11-27 NOTE — Progress Notes (Signed)
Office Visit Note   Patient: Eduardo [name]           Date of Birth: 10-13-1956           MRN: [id number] Visit Date: 11/27/2019              Requested by: Rita Ohara, [address] [place],  [place] [address] PCP: Rita Ohara, MD  Chief Complaint  Patient presents with  . Right Foot - Follow-up    05/19/19 right 5th ray amputation       HPI: This is a pleasant gentleman who is 6 months status post right fifth ray amputation he has a persistent area of callus that continues to improve.  He has been using a doughnut no shoe  Assessment & Plan: Visit Diagnoses: No diagnosis found.  Plan: We will follow up in 1 month  Follow-Up Instructions: No follow-ups on file.   Ortho Exam  Patient is alert, oriented, no adenopathy, well-dressed, normal affect, normal respiratory effort. Incision is completely healed he does have some callusing on the lateral side of his foot there is no surrounding erythema no fluctuance.  After obtaining verbal consent I trimmed the callus to healthy bleeding tissue hemostasis was achieved a Band-Aid was applied  Imaging: No results found. No images are attached to the encounter.  Labs: Lab Results  Component Value Date   HGBA1C 7.2 04/14/2019   HGBA1C 7.2 06/09/2018   HGBA1C 7.1% 05/24/2017   REPTSTATUS 05/02/2019 FINAL 04/28/2019   GRAMSTAIN  04/28/2019    NO WBC SEEN NO ORGANISMS SEEN Performed at [facility], [place] [address] [place], [place] [address]    CULT RARE ENTEROCOCCUS FAECALIS 04/28/2019   LABORGA ENTEROCOCCUS FAECALIS 04/28/2019     Lab Results  Component Value Date   ALBUMIN 3.8 04/21/2019   ALBUMIN 4.1 09/28/2011    No results found for: MG No results found for: VD25OH  No results found for: PREALBUMIN CBC EXTENDED Latest Ref Rng & Units 05/19/2019 04/21/2019 02/23/2019  WBC 4.0 - 10.5 K/uL 8.5 7.7 7.9  RBC 4.22 - 5.81 MIL/uL 4.66 4.85 5.05  HGB 13.0 - 17.0 g/dL 14.3 15.3 15.8  HCT 39.0 - 52.0 % 43.0  44.9 45.9  PLT 150 - 400 K/uL 186 198 211  NEUTROABS 1.7 - 7.7 K/uL - - -  LYMPHSABS 0.7 - 4.0 K/uL - - -     Body mass index is 41.61 kg/m.  Orders:  No orders of the defined types were placed in this encounter.  No orders of the defined types were placed in this encounter.    Procedures: No procedures performed  Clinical Data: No additional findings.  ROS:  All other systems negative, except as noted in the HPI. Review of Systems  Objective: Vital Signs: Ht 5\' 10"  (1.778 m)   Wt 290 lb (131.5 kg)   BMI 41.61 kg/m   Specialty Comments:  No specialty comments available.  PMFS History: Patient Active Problem List   Diagnosis Date Noted  . Partial nontraumatic amputation of foot, right ([place]) 06/21/2019  . Ischemic ulcer of lower leg due to atherosclerosis ([place]) 02/28/2019  . Essential hypertension, benign 02/06/2019  . Hypercholesteremia 02/06/2019  . CKD (chronic kidney disease) stage 3, GFR 30-59 ml/min 10/06/2017  . Proteinuria 10/06/2017  . Type 2 diabetes mellitus with nephropathy ([place]) 10/06/2017  . 3-vessel coronary artery disease 05/23/2017  . Abnormal nuclear stress test 09/06/2016  . PAD (peripheral artery disease) ([place]) 10/28/2012  .  Morbid obesity with BMI of 40.0-44.9, adult ([place]) 10/13/2012  . Diabetes mellitus with ophthalmic complication ([place]) Q000111Q  . Hypertension associated with diabetes ([place]) 10/13/2012  . Atherosclerosis of native artery of extremity with intermittent claudication ([place]) 04/05/2012  . Adenomatous colon polyp 09/28/2011  . Pure hyperglyceridemia 09/28/2011  . Erectile dysfunction 09/28/2011  . Diabetic retinopathy ([place]) 09/28/2011   Past Medical History:  Diagnosis Date  . Chronic kidney disease    stage 3  . Colon polyp   . Coronary artery disease   . Diabetes mellitus 1987   under care of Dr. Chalmers Cater.  On insulin since 96 (off and on)  . Diabetic retinopathy   . Dupuytren contracture    R hand, s/p injection  (Dr. Lenon Curt)  . Essential hypertension, benign   . Essential hypertension, benign 02/06/2019  . Frequency of urination and polyuria   . Hypertension   . Myocardial infarction [facility])    denies  . Neuromuscular disorder ([place])    Diabetic neuropathy  . Osteomyelitis ([place])    right foot  . Other testicular hypofunction   . Peripheral arterial disease ([place]) 10/28/2012  . Peritoneal abscess ([place]) 6/08   and buttock.  . Pneumonia   . Polydipsia   . Proteinuria   . Pure hyperglyceridemia   . Subacute osteomyelitis, right ankle and foot ([place])   . Wears glasses     Family History  Problem Relation Age of Onset  . Diabetes Mother   . Hearing loss Mother   . Hypertension Mother   . Hyperlipidemia Mother   . Heart disease Mother   . Varicose Veins Mother   . Varicose Veins Father   . Dementia Father   . Hyperlipidemia Brother   . Diabetes Maternal Grandmother     Past Surgical History:  Procedure Laterality Date  . ABDOMINAL AORTAGRAM N/A 04/18/2012   Procedure: ABDOMINAL Maxcine Ham;  Surgeon: Angelia Mould, MD;  Location: [facility] C[facility] LAB;  Service: Cardiovascular;  Laterality: N/A;  . AMPUTATION Right 05/19/2019   Procedure: RIGHT FOOT 5TH RAY AMPUTATION;  Surgeon: Newt Minion, MD;  Location: [place];  Service: Orthopedics;  Laterality: Right;  . CARDIAC CATHETERIZATION N/A 09/08/2016   Procedure: Left Heart Cath and Coronary Angiography;  Surgeon: Adrian Prows, MD;  Location: [place] CV LAB;  Service: Cardiovascular;  Laterality: N/A;  . CATARACT EXTRACTION, BILATERAL  09/2017, 10/2017   Dr. Herbert Deaner  . COLONOSCOPY W/ BIOPSIES AND POLYPECTOMY    . LOWER EXTREMITY ANGIOGRAM Bilateral 04/18/2012   Procedure: LOWER EXTREMITY ANGIOGRAM;  Surgeon: Angelia Mould, MD;  Location: [facility] CATH LAB;  Service: Cardiovascular;  Laterality: Bilateral;  bilat lower extrem angio  . LOWER EXTREMITY ANGIOGRAPHY Bilateral 05/02/2019   Procedure: LOWER EXTREMITY ANGIOGRAPHY;  Surgeon: Adrian Prows,  MD;  Location: [place] CV LAB;  Service: Cardiovascular;  Laterality: Bilateral;  . LOWER EXTREMITY ANGIOGRAPHY Bilateral 02/28/2019   Procedure: LOWER EXTREMITY ANGIOGRAPHY;  Surgeon: Adrian Prows, MD;  Location: [place] CV LAB;  Service: Cardiovascular;  Laterality: Bilateral;  . macular photocoagulation     (eye treatments for diabetic retinopathy)-Dr. Zigmund Daniel  . PERIPHERAL VASCULAR INTERVENTION  02/28/2019   Procedure: PERIPHERAL VASCULAR INTERVENTION;  Surgeon: Adrian Prows, MD;  Location: [place] CV LAB;  Service: Cardiovascular;;   Social History   Occupational History  . 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 Smoking status: Former Smoker    Packs/day: 1.00  Years: 30.00    Pack years: 30.00    Types: Cigarettes    Quit date: 01/08/2012    Years since quitting: 7.8  . Smokeless tobacco: Never Used  Substance and Sexual Activity  . Alcohol use: Yes    Comment: rare  . Drug use: No  . Sexual activity: Yes    Partners: Female    Birth control/protection: Condom

## 2019-12-02 ENCOUNTER — Encounter: Payer: Self-pay | Admitting: Family Medicine

## 2019-12-06 ENCOUNTER — Other Ambulatory Visit: Payer: Self-pay | Admitting: Cardiology

## 2019-12-28 ENCOUNTER — Ambulatory Visit: Payer: BC Managed Care – PPO | Admitting: Orthopedic Surgery

## 2020-01-02 DIAGNOSIS — I10 Essential (primary) hypertension: Secondary | ICD-10-CM | POA: Diagnosis not present

## 2020-01-02 DIAGNOSIS — R809 Proteinuria, unspecified: Secondary | ICD-10-CM | POA: Diagnosis not present

## 2020-01-02 DIAGNOSIS — E7800 Pure hypercholesterolemia, unspecified: Secondary | ICD-10-CM | POA: Diagnosis not present

## 2020-01-02 DIAGNOSIS — E1165 Type 2 diabetes mellitus with hyperglycemia: Secondary | ICD-10-CM | POA: Diagnosis not present

## 2020-01-04 ENCOUNTER — Other Ambulatory Visit: Payer: Self-pay

## 2020-01-04 ENCOUNTER — Ambulatory Visit (INDEPENDENT_AMBULATORY_CARE_PROVIDER_SITE_OTHER): Payer: BC Managed Care – PPO | Admitting: Physician Assistant

## 2020-01-04 ENCOUNTER — Encounter: Payer: Self-pay | Admitting: Orthopedic Surgery

## 2020-01-04 VITALS — Ht 70.0 in | Wt 290.0 lb

## 2020-01-04 DIAGNOSIS — M86271 Subacute osteomyelitis, right ankle and foot: Secondary | ICD-10-CM | POA: Diagnosis not present

## 2020-01-04 NOTE — Progress Notes (Signed)
Office Visit Note   Patient: Eduardo [name]           Date of Birth: 1956/10/09           MRN: [id number] Visit Date: 01/04/2020              Requested by: Rita Ohara, [address] [place],  [place] [address] PCP: Rita Ohara, MD  Chief Complaint  Patient presents with  . Right Foot - Follow-up    05/19/19 right 5th ray amputation       HPI: This is a pleasant gentleman who is now 7 months status post right fifth ray amputation.  He feels well and is wearing a regular shoe.  He has not had any drainage in the wound.  The only thing he has is a small callus at the proximal end of the incision which he tries to soften with moisturizer.  Assessment & Plan: Visit Diagnoses: No diagnosis found.  Plan: He is doing well and I think at this point he can follow-up as needed.  Of course I reminded him to check his foot daily and if there are any changes we would see him back  Follow-Up Instructions: No follow-ups on file.   Ortho Exam  Patient is alert, oriented, no adenopathy, well-dressed, normal affect, normal respiratory effort. Focused examination of his right foot demonstrates well-healed surgical incision.  He has a small yellow callus without any surrounding erythema there is no drainage.  After obtaining verbal consent I did trim this callus to a healthy soft skin no fluctuance over the area no surrounding cellulitis  Imaging: No results found. No images are attached to the encounter.  Labs: Lab Results  Component Value Date   HGBA1C 7.2 04/14/2019   HGBA1C 7.2 06/09/2018   HGBA1C 7.1% 05/24/2017   REPTSTATUS 05/02/2019 FINAL 04/28/2019   GRAMSTAIN  04/28/2019    NO WBC SEEN NO ORGANISMS SEEN Performed at [facility], [place] [address] [place], [place] [address]    CULT RARE ENTEROCOCCUS FAECALIS 04/28/2019   LABORGA ENTEROCOCCUS FAECALIS 04/28/2019     Lab Results  Component Value Date   ALBUMIN 3.8 04/21/2019   ALBUMIN 4.1 09/28/2011    No  results found for: MG No results found for: VD25OH  No results found for: PREALBUMIN CBC EXTENDED Latest Ref Rng & Units 05/19/2019 04/21/2019 02/23/2019  WBC 4.0 - 10.5 K/uL 8.5 7.7 7.9  RBC 4.22 - 5.81 MIL/uL 4.66 4.85 5.05  HGB 13.0 - 17.0 g/dL 14.3 15.3 15.8  HCT 39.0 - 52.0 % 43.0 44.9 45.9  PLT 150 - 400 K/uL 186 198 211  NEUTROABS 1.7 - 7.7 K/uL - - -  LYMPHSABS 0.7 - 4.0 K/uL - - -     Body mass index is 41.61 kg/m.  Orders:  No orders of the defined types were placed in this encounter.  No orders of the defined types were placed in this encounter.    Procedures: No procedures performed  Clinical Data: No additional findings.  ROS:  All other systems negative, except as noted in the HPI. Review of Systems  Objective: Vital Signs: Ht 5\' 10"  (1.778 m)   Wt 290 lb (131.5 kg)   BMI 41.61 kg/m   Specialty Comments:  No specialty comments available.  PMFS History: Patient Active Problem List   Diagnosis Date Noted  . Partial nontraumatic amputation of foot, right ([place]) 06/21/2019  . Ischemic ulcer of lower leg due to atherosclerosis ([place]) 02/28/2019  .  Essential hypertension, benign 02/06/2019  . Hypercholesteremia 02/06/2019  . CKD (chronic kidney disease) stage 3, GFR 30-59 ml/min 10/06/2017  . Proteinuria 10/06/2017  . Type 2 diabetes mellitus with nephropathy ([place]) 10/06/2017  . 3-vessel coronary artery disease 05/23/2017  . Abnormal nuclear stress test 09/06/2016  . PAD (peripheral artery disease) ([place]) 10/28/2012  . Morbid obesity with BMI of 40.0-44.9, adult ([place]) 10/13/2012  . Diabetes mellitus with ophthalmic complication ([place]) Q000111Q  . Hypertension associated with diabetes ([place]) 10/13/2012  . Atherosclerosis of native artery of extremity with intermittent claudication ([place]) 04/05/2012  . Adenomatous colon polyp 09/28/2011  . Pure hyperglyceridemia 09/28/2011  . Erectile dysfunction 09/28/2011  . Diabetic retinopathy ([place]) 09/28/2011    Past Medical History:  Diagnosis Date  . Chronic kidney disease    stage 3  . Colon polyp   . Coronary artery disease   . Diabetes mellitus 1987   under care of Dr. Chalmers Cater.  On insulin since 96 (off and on)  . Diabetic retinopathy   . Dupuytren contracture    R hand, s/p injection (Dr. Lenon Curt)  . Essential hypertension, benign   . Essential hypertension, benign 02/06/2019  . Frequency of urination and polyuria   . Hypertension   . Myocardial infarction [facility])    denies  . Neuromuscular disorder ([place])    Diabetic neuropathy  . Osteomyelitis ([place])    right foot  . Other testicular hypofunction   . Peripheral arterial disease ([place]) 10/28/2012  . Peritoneal abscess ([place]) 6/08   and buttock.  . Pneumonia   . Polydipsia   . Proteinuria   . Pure hyperglyceridemia   . Subacute osteomyelitis, right ankle and foot ([place])   . Wears glasses     Family History  Problem Relation Age of Onset  . Diabetes Mother   . Hearing loss Mother   . Hypertension Mother   . Hyperlipidemia Mother   . Heart disease Mother   . Varicose Veins Mother   . Varicose Veins Father   . Dementia Father   . Hyperlipidemia Brother   . Diabetes Maternal Grandmother     Past Surgical History:  Procedure Laterality Date  . ABDOMINAL AORTAGRAM N/A 04/18/2012   Procedure: ABDOMINAL Maxcine Ham;  Surgeon: Angelia Mould, MD;  Location: [facility] CATH LAB;  Service: Cardiovascular;  Laterality: N/A;  . AMPUTATION Right 05/19/2019   Procedure: RIGHT FOOT 5TH RAY AMPUTATION;  Surgeon: Newt Minion, MD;  Location: [place];  Service: Orthopedics;  Laterality: Right;  . CARDIAC CATHETERIZATION N/A 09/08/2016   Procedure: Left Heart Cath and Coronary Angiography;  Surgeon: Adrian Prows, MD;  Location: [place] CV LAB;  Service: Cardiovascular;  Laterality: N/A;  . CATARACT EXTRACTION, BILATERAL  09/2017, 10/2017   Dr. Herbert Deaner  . COLONOSCOPY W/ BIOPSIES AND POLYPECTOMY    . LOWER EXTREMITY ANGIOGRAM Bilateral 04/18/2012    Procedure: LOWER EXTREMITY ANGIOGRAM;  Surgeon: Angelia Mould, MD;  Location: [facility] CATH LAB;  Service: Cardiovascular;  Laterality: Bilateral;  bilat lower extrem angio  . LOWER EXTREMITY ANGIOGRAPHY Bilateral 05/02/2019   Procedure: LOWER EXTREMITY ANGIOGRAPHY;  Surgeon: Adrian Prows, MD;  Location: [place] CV LAB;  Service: Cardiovascular;  Laterality: Bilateral;  . LOWER EXTREMITY ANGIOGRAPHY Bilateral 02/28/2019   Procedure: LOWER EXTREMITY ANGIOGRAPHY;  Surgeon: Adrian Prows, MD;  Location: [place] CV LAB;  Service: Cardiovascular;  Laterality: Bilateral;  . macular photocoagulation     (eye treatments for diabetic retinopathy)-Dr. Zigmund Daniel  . PERIPHERAL VASCULAR INTERVENTION  02/28/2019   Procedure:  PERIPHERAL VASCULAR INTERVENTION;  Surgeon: Adrian Prows, MD;  Location: [place] CV LAB;  Service: Cardiovascular;;   Social History   Occupational History  . 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 Smoking status: Former Smoker    Packs/day: 1.00    Years: 30.00    Pack years: 30.00    Types: Cigarettes    Quit date: 01/08/2012    Years since quitting: 7.9  . Smokeless tobacco: Never Used  Substance and Sexual Activity  . Alcohol use: Yes    Comment: rare  . Drug use: No  . Sexual activity: Yes    Partners: Female    Birth control/protection: Condom

## 2020-01-17 ENCOUNTER — Other Ambulatory Visit: Payer: Self-pay | Admitting: Cardiology

## 2020-01-26 ENCOUNTER — Other Ambulatory Visit: Payer: Self-pay | Admitting: Cardiology

## 2020-02-02 ENCOUNTER — Other Ambulatory Visit: Payer: Self-pay | Admitting: Cardiology

## 2020-03-18 ENCOUNTER — Encounter (INDEPENDENT_AMBULATORY_CARE_PROVIDER_SITE_OTHER): Payer: BC Managed Care – PPO | Admitting: Ophthalmology

## 2020-03-18 ENCOUNTER — Other Ambulatory Visit: Payer: Self-pay

## 2020-03-18 DIAGNOSIS — E11311 Type 2 diabetes mellitus with unspecified diabetic retinopathy with macular edema: Secondary | ICD-10-CM | POA: Diagnosis not present

## 2020-03-18 DIAGNOSIS — H35033 Hypertensive retinopathy, bilateral: Secondary | ICD-10-CM

## 2020-03-18 DIAGNOSIS — E113311 Type 2 diabetes mellitus with moderate nonproliferative diabetic retinopathy with macular edema, right eye: Secondary | ICD-10-CM | POA: Diagnosis not present

## 2020-03-18 DIAGNOSIS — I10 Essential (primary) hypertension: Secondary | ICD-10-CM | POA: Diagnosis not present

## 2020-03-18 DIAGNOSIS — E113512 Type 2 diabetes mellitus with proliferative diabetic retinopathy with macular edema, left eye: Secondary | ICD-10-CM | POA: Diagnosis not present

## 2020-03-18 DIAGNOSIS — H43813 Vitreous degeneration, bilateral: Secondary | ICD-10-CM

## 2020-04-18 NOTE — Progress Notes (Signed)
Primary Physician/Referring:  Rita Ohara, MD  Patient ID: Eduardo [name], male    DOB: 1956-03-05, 64 y.o.   MRN: [id number]  Chief Complaint  Patient presents with  . Coronary Artery Disease  . PAD  . Follow-up    6 month   HPI:    Eduardo [name]  is a 64 y.o. Caucasian male  with controlled type 2 diabetes, hypertension, hyperlipidemia, coronary artery disease by angiography on 09/08/2016 revealing severe triple-vessel CAD with no targets for CABG, presently on aggressive medical therapy, mild carotid atherosclerosis, right SFA occlusion S/P complex PV angiogram on 02/28/2019 with PTA to occluded right SFA with implantation of 3 overlapping 6.0 x 120 x2; 6.0 x 100 mm Eluvia DES for critical right leg ischemia now resolved. 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 He has mild disease in the left leg.  He is presently doing well, has not had any chest pain or dyspnea, diabetes is improving.  No leg edema, no claudication.  Past Medical History:  Diagnosis Date  . Chronic kidney disease    stage 3  . Colon polyp   . Coronary artery disease   . Diabetes mellitus 1987   under care of Dr. Chalmers Cater.  On insulin since 96 (off and on)  . Diabetic retinopathy   . Dupuytren contracture    R hand, s/p injection (Dr. Lenon Curt)  . Essential hypertension, benign   . Essential hypertension, benign 02/06/2019  . Frequency of urination and polyuria   . Hypertension   . Myocardial infarction [facility])    denies  . Neuromuscular disorder ([place])    Diabetic neuropathy  . Osteomyelitis ([place])    right foot  . Other testicular hypofunction   . Peripheral arterial disease ([place]) 10/28/2012  . Peritoneal abscess ([place]) 6/08   and buttock.  . Pneumonia   . Polydipsia   . Proteinuria   . Pure hyperglyceridemia   . Subacute osteomyelitis, right ankle and foot ([place])   . Wears glasses    Past Surgical History:  Procedure Laterality Date  . ABDOMINAL AORTAGRAM N/A 04/18/2012   Procedure:  ABDOMINAL Maxcine Ham;  Surgeon: Angelia Mould, MD;  Location: [facility] C[facility] LAB;  Service: Cardiovascular;  Laterality: N/A;  . AMPUTATION Right 05/19/2019   Procedure: RIGHT FOOT 5TH RAY AMPUTATION;  Surgeon: Newt Minion, MD;  Location: [place];  Service: Orthopedics;  Laterality: Right;  . CARDIAC CATHETERIZATION N/A 09/08/2016   Procedure: Left Heart Cath and Coronary Angiography;  Surgeon: Adrian Prows, MD;  Location: [place] CV LAB;  Service: Cardiovascular;  Laterality: N/A;  . CATARACT EXTRACTION, BILATERAL  09/2017, 10/2017   Dr. Herbert Deaner  . COLONOSCOPY W/ BIOPSIES AND POLYPECTOMY    . LOWER EXTREMITY ANGIOGRAM Bilateral 04/18/2012   Procedure: LOWER EXTREMITY ANGIOGRAM;  Surgeon: Angelia Mould, MD;  Location[facility] CATH LAB;  Service: Cardiovascular;  Laterality: Bilateral;  bilat lower extrem angio  . LOWER EXTREMITY ANGIOGRAPHY Bilateral 05/02/2019   Procedure: LOWER EXTREMITY ANGIOGRAPHY;  Surgeon: Adrian Prows, MD;  Location: [place] CV LAB;  Service: Cardiovascular;  Laterality: Bilateral;  . LOWER EXTREMITY ANGIOGRAPHY Bilateral 02/28/2019   Procedure: LOWER EXTREMITY ANGIOGRAPHY;  Surgeon: Adrian Prows, MD;  Location: [place] CV LAB;  Service: Cardiovascular;  Laterality: Bilateral;  . macular photocoagulation     (eye treatments for diabetic retinopathy)-Dr. Zigmund Daniel  . PERIPHERAL VASCULAR INTERVENTION  02/28/2019   Procedure: PERIPHERAL VASCULAR INTERVENTION;  Surgeon: Adrian Prows, MD;  Location: [place] CV  LAB;  Service: Cardiovascular;;    Family History  Problem Relation Age of Onset  . Diabetes Mother   . Hearing loss Mother   . Hypertension Mother   . Hyperlipidemia Mother   . Heart disease Mother   . Varicose Veins Mother   . Varicose Veins Father   . Dementia Father   . Hyperlipidemia Brother   . Diabetes Maternal Grandmother    Social History   Tobacco Use  . Smoking status: Former Smoker    Packs/day: 1.00    Years: 30.00    Pack years: 30.00     Types: Cigarettes    Quit date: 01/08/2012    Years since quitting: 8.2  . Smokeless tobacco: Never Used  Substance Use Topics  . Alcohol use: Yes    Comment: rare   Marital Status: Widowed ROS  Review of Systems  Cardiovascular: Positive for dyspnea on exertion (stable). Negative for leg swelling and syncope.  All other systems reviewed and are negative.  Objective  Blood pressure 130/72, pulse 63, resp. rate 16, height 5' 10"  (1.778 m), weight (!) 301 lb (136.5 kg), SpO2 96 %.  Vitals with BMI 04/19/2020 01/04/2020 11/27/2019  Height 5' 10"  5' 10"  5' 10"   Weight 301 lbs 290 lbs 290 lbs  BMI 43.19 37.04 88.89  Systolic 169 - -  Diastolic 72 - -  Pulse 63 - -     Physical Exam Constitutional:      General: He is not in acute distress.    Appearance: He is well-developed.     Comments: Morbidly obese  HENT:     Head: Atraumatic.  Neck:     Thyroid: No thyromegaly.     Comments: Short neck and difficult to evaluate JVP Cardiovascular:     Pulses:          Carotid pulses are 2+ on the right side and 2+ on the left side.      Popliteal pulses are 0 on the right side and 2+ on the left side.       Dorsalis pedis pulses are 1+ on the right side and 2+ on the left side.       Posterior tibial pulses are 0 on the right side and 1+ on the left side.     Heart sounds: No murmur heard.  No gallop.      Comments: Femoral and popliteal pulse difficult to feel due to patient's body habitus. Neuropathic ulcer right sole of the foot slowly healing and chronic. Pulmonary:     Effort: Pulmonary effort is normal.  Abdominal:     Palpations: Abdomen is soft.     Comments: Obese. Pannus present  Musculoskeletal:        General: Normal range of motion.     Cervical back: Neck supple.  Skin:    General: Skin is dry.  Neurological:     Mental Status: He is alert.    Laboratory examination:   Recent Labs    04/21/19 0912 05/19/19 0935  NA 139 137  K 4.9 4.4  CL 104 107  CO2  22 19*  GLUCOSE 140* 112*  BUN 31* 35*  CREATININE 1.31* 1.27*  CALCIUM 8.6 8.5*  GFRNONAA 58* 60*  GFRAA 67 >60   CrCl cannot be calculated (Patient's most recent lab result is older than the maximum 21 days allowed.).  CMP Latest Ref Rng & Units 05/19/2019 04/21/2019 02/23/2019  Glucose 70 - 99 mg/dL 112(H) 140(H) 85  BUN 8 -  23 mg/dL 35(H) 31(H) 32(H)  Creatinine 0.61 - 1.24 mg/dL 1.27(H) 1.31(H) 1.40(H)  Sodium 135 - 145 mmol/L 137 139 142  Potassium 3.5 - 5.1 mmol/L 4.4 4.9 4.6  Chloride 98 - 111 mmol/L 107 104 103  CO2 22 - 32 mmol/L 19(L) 22 24  Calcium 8.9 - 10.3 mg/dL 8.5(L) 8.6 9.2  Total Protein 6.0 - 8.5 g/dL - 5.8(L) -  Total Bilirubin 0.0 - 1.2 mg/dL - 0.4 -  Alkaline Phos 39 - 117 IU/L - 68 -  AST 0 - 40 IU/L - 21 -  ALT 0 - 44 IU/L - 20 -   CBC Latest Ref Rng & Units 05/19/2019 04/21/2019 02/23/2019  WBC 4.0 - 10.5 K/uL 8.5 7.7 7.9  Hemoglobin 13.0 - 17.0 g/dL 14.3 15.3 15.8  Hematocrit 39 - 52 % 43.0 44.9 45.9  Platelets 150 - 400 K/uL 186 198 211   Lipid Panel     Component Value Date/Time   CHOL 108 04/21/2019 0912   TRIG 102 04/21/2019 0912   HDL 33 (L) 04/21/2019 0912   CHOLHDL 4.4 09/28/2011 1358   VLDL 21 09/28/2011 1358   LDLCALC 55 04/21/2019 0912    HEMOGLOBIN A1C Lab Results  Component Value Date   HGBA1C 7.2 04/14/2019   MPG 192 (H) 09/28/2011   TSH Recent Labs    06/12/19 1000  TSH 2.250   External Labs:  Glucose Random 453.000 M 09/08/2019 BUN 38.000 M 09/08/2019 Creatinine, Serum 1.340 MG/ 09/08/2019  PSA 3.300 06/12/2019 01/19/2018: Cholesterol 115, triglycerides 112, HDL 39, LDL 54.  Glucose 125, creatinine 1.16, potassium 5.2, EGFR 67, CMP otherwise normal.  CBC normal.  TSH 1.5.  Medications and allergies   Allergies  Allergen Reactions  . Food     Mussels=nausea/vomiting     Current Outpatient Medications  Medication Instructions  . B-D UF III MINI PEN NEEDLES 31G X 5 MM MISC U UTD 6 TIMES A DAY  . Bioflavonoid  Products (ESTER-C) 1000-50 MG TABS 1 tablet, Oral, Daily  . clopidogrel (PLAVIX) 75 MG tablet TAKE 1 TABLET BY MOUTH DAILY  . Continuous Blood Gluc Sensor (FREESTYLE LIBRE 14 DAY SENSOR) MISC USE UTD Q 14 DAYS SUBCUTANEOUS  . furosemide (LASIX) 20 mg, Oral, Daily  . gemfibrozil (LOPID) 600 mg, 2 times daily before meals  . HumaLOG KwikPen 0-20 Units, Subcutaneous, 2 times daily, SLIDING SCALE DEPENDING ON BLOOD GLUCOSE BEFORE MEAL  . HumuLIN N KwikPen 30-40 Units, Subcutaneous, See admin instructions, Inject 45 units subcutaneously in the morning & 35 units in the evening.  . isosorbide mononitrate (IMDUR) 60 MG 24 hr tablet TAKE 1 TABLET BY MOUTH DAILY  . Krill Oil 350 mg, Oral, Daily, Move Free Ultra Krill Oil  . lisinopril (ZESTRIL) 10 mg, Oral, Daily  . metoprolol succinate (TOPROL-XL) 100 MG 24 hr tablet TAKE 1 TABLET BY MOUTH DAILY  . Multiple Vitamin (MULTIVITAMIN WITH MINERALS) TABS tablet 1 tablet, Oral, Daily  . nitroGLYCERIN (NITROSTAT) 0.4 mg, Sublingual, Every 5 min PRN  . Probiotic Product (PROBIOTIC-10 PO) 1 capsule, Oral, Daily  . rosuvastatin (CRESTOR) 20 mg, Oral, Daily  . Vitamin D 2,000 Units, Oral, Daily   Medications Discontinued During This Encounter  Medication Reason  . aspirin 81 MG chewable tablet Completed Course  . Calcium Alginate-Silver (ANTIBACTERIAL ALGINATE W/SILV) 1.94"R7.40" PADS Patient Preference    Radiology:  No results found.  Cardiac Studies:   Coronary angiogram 09/08/2016: Low normal LVEF, 44-50% with apical akinesis. Occluded mid LAD, D2 mod  sized 80% stenosis, OM1 bifurcates, occluded in the proximal segment. Large dominant RCA. 4 PL branches, large PL 1 with 80% stenosis, large PL 3 long segment occlusion. All CTO's have ipsilateral and contralateral faint collaterals. Calcification.   Echocardiogram 09/01/2016: Left ventricle cavity is normal in size. Moderate concentric hypertrophy of the left ventricle. Mild to moderately reduced left  ventricular function. Left ventricle regional wall motion findings: Poor echo window and reduced sensitivity.  There is inferior hypokinesis, lateral hypokinesis, mild global hypokinesis. Grade 1 diastolic dysfunction.  Calculated EF 40%. Left atrial cavity is mildly dilated at 4.2 cm. IVC is dilated with poor inspiration collapse consistent with elevated right atrial pressure.   Exercise sestamibi stress test 08/17/2016: 1. Resting EKG demonstrates normal sinus rhythm, left axis deviation, left plantar fascicular block.  Poor R-wave progression, ulnar disease pattern.  Nonspecific ST-T abnormality, cannot exclude lateral ischemia.  Stress EKG is equivocal for ischemia, patient developing atypical left otherwise for with exercise which reverted back to baseline immediately on combination of the stress test less than 60 seconds.  There was no additional ST-T wave changes of ischemia. Patient exercised on Bruce protocol for 4:25 minutes and achieved 5.45 METS. Stress test terminated due to 89 % MPHR achieved (Target HR >85%). Symptoms included dizziness.  2.  2-Day protocol followed. Perfusion imaging studies demonstrate large sized severe perfusion defect involving the inferior, anterior and anterolateral consistent with inferior wall scar with moderate peri-infarct ischemia and severe anterior and anteroapical reversible ischemia extending from the base towards the apex.  Left ventricular systolic function calculating by QGS was markedly depressed at 26%.  This is a high risk study, consider further cardiac work-up.   Carotid artery duplex 02/16/2018: No hemodynamically significant arterial disease in the internal carotid artery bilaterally. Mild heterogenous plaque noted.  Antegrade right vertebral artery flow. Antegrade left vertebral artery flow. Compared to the study done on 09/01/2016, left carotid noted as occlusion is an error. This may perhaps be due to low velocity noted in both carotid arteries  and may indicate low systemic BP or reduced cardiac output. Clinical correlation recommended.  Peripheral arteriogram 02/28/2019: Pelvic aortogram with limited bifemoral arteriogram revealed patent distal abdominal aorta without aneurysm, patent, bilateral iliac and common femoral vessels. Right SFA is occluded in the ostium and reconstitutes outside of the Hunter's canal.  Below the right knee there is two-vessel runoff, AT is occluded.  Left leg was not studied beyond left common femoral artery. Successful PTA and stenting of right SFA, prolonged procedure, difficult procedure with use of multiple balloons, guidewires and catheters.  100% stenosis reduced to 0% with implantation of 3 overlapping 6.0 x 120 x2; 6.0 x 100 mm Eluvia DES. 180 mL contrast utilized.  Peripheral arteriogram 05/02/2019: Right common femoral artery and right proximal SFA previously placed stent widely patent.  Right iliac artery shows mild disease. Left iliac artery and left femoral arteries show very mild disease.  There is two-vessel runoff in the left leg, left AT is occluded. Intermediate stenosis noted in the left distal and proximal SFA, pressure pullback reveals no significant gradient.   ABI 08/09/2019: This exam reveals normal perfusion of the right lower extremity (ABI). This exam reveals normal perfusion of the left lower extremity (ABI).  The ABI may be falsely elevated due to medial calcinosis from diabetes. No significant change since 05/11/2019. Patient has h/o right SFA stenting.     EKG   EKG 04/19/2020: Normal sinus rhythm at rate of 61 bpm, inferior infarct old.  Anteroseptal infarct old.  Nonspecific T abnormality.  Borderline low voltage complexes.  No significant change from 11/08/2019.   Assessment     ICD-10-CM   1. Peripheral artery disease (HCC)  I73.9 EKG 12-Lead  2. 3-vessel coronary artery disease  I25.10   3. Stage 3a chronic kidney disease  N18.31   4. Essential hypertension, benign   I10      Recommendations:    SOMA LIZAK  is a 64 y.o. Caucasian male  with controlled type 2 diabetes, hypertension, hyperlipidemia, coronary artery disease by angiography on 09/08/2016 revealing severe triple-vessel CAD with no targets for CABG, presently on aggressive medical therapy, mild carotid atherosclerosis, right SFA occlusion S/P complex PV angiogram on 02/28/2019 with PTA to occluded right SFA with implantation of 3 overlapping 6.0 x 120 x2; 6.0 x 100 mm Eluvia DES for critical right leg ischemia now resolved. Underwent right 5th toe amputation by Dr. Sharol Given. He has mild disease in the left leg.  He presents here for 4-monthoffice visit, fortunately he has not had any angina pectoris or clinical evidence of congestive heart failure.  His obesity continues to be a major issue.  Diabetes is improving.  Blood pressure is well controlled, lipids are also well controlled, external labs reviewed.  His peripheral arterial disease is now stable without recurrence of claudication, no open wounds.  I will discontinue aspirin as it has been greater than a year since his angioplasty.  I would like to see him back in 6 months for follow-up and close monitoring of his vascular risk factors.  JAdrian Prows MD, [facility]6/09/2020, 12:20 PM [email or web address]. [facility] Pager: [phone] Office: [phone]

## 2020-04-19 ENCOUNTER — Other Ambulatory Visit: Payer: Self-pay

## 2020-04-19 ENCOUNTER — Ambulatory Visit: Payer: BC Managed Care – PPO | Admitting: Cardiology

## 2020-04-19 ENCOUNTER — Encounter: Payer: Self-pay | Admitting: Cardiology

## 2020-04-19 VITALS — BP 130/72 | HR 63 | Resp 16 | Ht 70.0 in | Wt 301.0 lb

## 2020-04-19 DIAGNOSIS — I739 Peripheral vascular disease, unspecified: Secondary | ICD-10-CM | POA: Diagnosis not present

## 2020-04-19 DIAGNOSIS — N1831 Chronic kidney disease, stage 3a: Secondary | ICD-10-CM | POA: Diagnosis not present

## 2020-04-19 DIAGNOSIS — I10 Essential (primary) hypertension: Secondary | ICD-10-CM

## 2020-04-19 DIAGNOSIS — I2510 Atherosclerotic heart disease of native coronary artery without angina pectoris: Secondary | ICD-10-CM | POA: Diagnosis not present

## 2020-04-29 ENCOUNTER — Other Ambulatory Visit: Payer: Self-pay | Admitting: Cardiology

## 2020-05-03 ENCOUNTER — Ambulatory Visit: Payer: BC Managed Care – PPO | Admitting: Cardiology

## 2020-05-16 ENCOUNTER — Other Ambulatory Visit: Payer: Self-pay | Admitting: Cardiology

## 2020-05-29 DIAGNOSIS — I129 Hypertensive chronic kidney disease with stage 1 through stage 4 chronic kidney disease, or unspecified chronic kidney disease: Secondary | ICD-10-CM | POA: Diagnosis not present

## 2020-05-29 DIAGNOSIS — N189 Chronic kidney disease, unspecified: Secondary | ICD-10-CM | POA: Diagnosis not present

## 2020-05-29 DIAGNOSIS — D631 Anemia in chronic kidney disease: Secondary | ICD-10-CM | POA: Diagnosis not present

## 2020-05-29 DIAGNOSIS — N2581 Secondary hyperparathyroidism of renal origin: Secondary | ICD-10-CM | POA: Diagnosis not present

## 2020-05-29 DIAGNOSIS — N1831 Chronic kidney disease, stage 3a: Secondary | ICD-10-CM | POA: Diagnosis not present

## 2020-06-22 NOTE — Patient Instructions (Addendum)
  HEALTH MAINTENANCE RECOMMENDATIONS:  It is recommended that you get at least 30 minutes of aerobic exercise at least 5 days/week (for weight loss, you may need as much as 60-90 minutes). This can be any activity that gets your heart rate up. This can be divided in 10-15 minute intervals if needed, but try and build up your endurance at least once a week.  Weight bearing exercise is also recommended twice weekly.  Eat a healthy diet with lots of vegetables, fruits and fiber.  "Colorful" foods have a lot of vitamins (ie green vegetables, tomatoes, red peppers, etc).  Limit sweet tea, regular sodas and alcoholic beverages, all of which has a lot of calories and sugar.  Up to 2 alcoholic drinks daily may be beneficial for men (unless trying to lose weight, watch sugars).  Drink a lot of water.  Sunscreen of at least SPF 30 should be used on all sun-exposed parts of the skin when outside between the hours of 10 am and 4 pm (not just when at beach or pool, but even with exercise, golf, tennis, and yard work!)  Use a sunscreen that says "broad spectrum" so it covers both UVA and UVB rays, and make sure to reapply every 1-2 hours.  Remember to change the batteries in your smoke detectors when changing your clock times in the spring and fall. Carbon monoxide detectors are recommended for your home.  Use your seat belt every time you are in a car, and please drive safely and not be distracted with cell phones and texting while driving.   Confirm with Dr. Einar Gip whether the aspirin should be stopped or not. Contact Dr. Perley Jain office, as I believe you are due for a colonoscopy (5 year follow-up).   Please contact the [place] to set up an appointment.  There is a callous formation (pre-ulcer) on the bottom of the right foot, plus your neuropathy.

## 2020-06-22 NOTE — Progress Notes (Signed)
Chief Complaint  Patient presents with  . Annual Exam    fasting annual exam, no new concerns.     Eduardo [name] is a 64 y.o. male who presents for a complete physical.  . He plans to retire in December. He doesn't particularly like his job.  Has things he is looking forward to doing (ie fishing), and thinks he will take better care of himself. He has the following concerns:  He has a wound on his left inner lower leg that he noticed about 10 days ago. He doesn't recall any trauma, unsure if he could have scraped the leg with a toenail in his sleep or not.  It had bled a lot.  It has scabbed over, so he thinks it is healing.  He doesn't think it has gotten larger in size, and isn't painful.  He reports he had a lot of labs done by [facility] in mid-July.  Records were not received from nephro. He believes the kidney docs check his lipids, isn't sure, and is okay with [place] checking them today. (last lipids we have in system was from 04/2019).  Heis under the care [facility] [facility] for diabetes. Notes have not been received from her office.  Patient states that his last A1c was 9.2% in March. 45 U Humalin BID, along with Humalog SSI prn.  Discussing ("threatened") insulin pump.  He is hesitant to do so (because sometimes he doesn't eat for 8-9 hours).  He is scheduled to see her for f/u on 8/30.  He believes she only checks A1c's, no other labs.  Denies any recent hypoglycemia. Denies polydipsia, urinary frequency only due to diuretic. Under the care of Dr. Zigmund Daniel for diabetic retinopathy, last seen 03/2020. He last had an injection in his L eye in May 2021. He has peripheral neuropathy withnumbness in his feet, not painful. He used to see [facility] for toenail care and foot exams.  He stopped going when he started going to orthopedist. 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  He has healed well with no further concerns. He hasn't been back to anyone for a foot  exam since 12/2019.  PAD, under the care of Dr. Einar Gip.  S/p R stent 02/2019.  He denies claudication.  He had been on dual antiplatelet therapy due to his drug-eluting stent. According to notes from Dr. Einar Gip, the aspirin was to be stopped (04/2020). Patient doesn't recall being told this, is still taking 81mg  ASA daily. He is s/p R 5th ray amputation 05/2019 by Dr. Sharol Given. He denies any further ulcers, just the new wound on L leg x 10 days.  3 vessel CAD, under the care of Dr. Einar Gip, last seen 04/2020. He denies any angina. Last cath was 09/08/2016 revealing severe triple-vessel CAD with no targets for CABG, on aggressive medical therapy. He denies chest pain, shortness of breath.  Stage 3 CKD--previously under the care of Dr. Jimmy Footman, who retired.  He was last seen by male nephro last month.  No med changes were made. As noted above, we have not received records or lab results.    Obesity:He started at [employer] January,2018;initial weight was 336#. Last year he reported he wasn'tt going to the virtual meetings, but still trying to follow the program.  He is still trying to follow the program.  He is doing the program online. He wasn't able to do much exercise related to his amputation.  He is slowly working back  to getting exercise.  Sometimes admits to late night eating and snacking occurs.  Foods aren't necessarily bad, just when he eats is, per patient.  HTN--he doesn't check BP's elsewhere, but checked by all his other doctors, always okay. Denies dizziness, headaches. Only occasional edema at the ankles. BP Readings from Last 3 Encounters:  04/19/20 130/72  11/08/19 124/64  06/12/19 (!) 160/74   Dupuytren's contractures--Can't straighten right pinkie or left ring finger. There is some discomfort/aching, relieved by aspercreme or Voltaren gel. Prior injection with Dr. Lenon Curt wasn't beneficial (only slight improvement, temporarily).   Erectile dysfunction:  Not on meds (due to CAD,  nitrates).  Offered referral to urologist last year to discuss other options.  He is not interested in treating it at this point.   Immunization History  Administered Date(s) Administered  . Influenza Split 08/10/2011  . Influenza,inj,Quad PF,6+ Mos 10/15/2016, 10/15/2017, 08/09/2018  . PFIZER SARS-COV-2 Vaccination 01/31/2020, 02/28/2020  . Pneumococcal Conjugate-13 05/25/2017  . Pneumococcal Polysaccharide-23 09/28/2011  . Td 06/03/2005  . Tdap 09/28/2011  . Zoster Recombinat (Shingrix) 06/06/2018, 08/09/2018   Last colonoscopy:11/2014,Dr.Magod-ext and int hemorrhoids and diverticulosis. (that was a 3 yr f/u, h/o polyps in past). Due again 2021. He was never contacted. Last PSA:  appears to be rising by 0.5 per year Lab Results  Component Value Date   PSA1 3.3 06/12/2019   PSA1 2.8 06/06/2018   PSA 2.3 05/25/2017   PSA 1.09 09/28/2011   Dentist:twice yearly Ophtho:regularly (due to retinopathy) Exercise:He is slowly resuming exercise, currently at 1/2 mile 3x/week. Also getting back to some bodyweight training (has bands and weights) (Prior routine had been walking 1 mile in the morning 4-5 days/weekplus bodyweight training every other day.)   Doctors caring for patient:  Endo: Dr. Chalmers Cater Cardiology: Dr. Einar Gip Nephro: Dr. Deterding--retired, now seeing male doctor there Derm: Dr. Renda Rolls GI: Dr. Watt Climes Ophtho: Dr. Zigmund Daniel, Dr. Peter Garter (previous Dr. Herbert Deaner, did cataract surgery) Podiatry: Dr. Jacqualyn Posey (not recently) Dentist:Dr. Cephus Slater at [facility]   [facility], [facility], [facility] and [facility] were reviewed and updated.  Outpatient Encounter Medications as of 06/24/2020  Medication Sig Note  . B-D UF III MINI PEN NEEDLES 31G X 5 MM MISC U UTD 6 TIMES A DAY   . Bioflavonoid Products (ESTER-C) 1000-50 MG TABS Take 1 tablet by mouth daily.    . Cholecalciferol (VITAMIN D) 2000 units CAPS Take 2,000 Units by mouth daily.   . clopidogrel (PLAVIX) 75 MG tablet TAKE 1  TABLET BY MOUTH DAILY   . furosemide (LASIX) 20 MG tablet Take 20 mg by mouth daily.    Marland Kitchen gemfibrozil (LOPID) 600 MG tablet Take 600 mg by mouth 2 (two) times daily before a meal.     . HUMALOG KWIKPEN 100 UNIT/ML KiwkPen Inject 0-20 Units into the skin 2 (two) times daily. SLIDING SCALE DEPENDING ON BLOOD GLUCOSE BEFORE MEAL 06/12/2019: Sliding scale  . HUMULIN N KWIKPEN 100 UNIT/ML Kiwkpen Inject 30-40 Units into the skin See admin instructions. Inject 45 units subcutaneously in the morning & 45units in the evening.   . isosorbide mononitrate (IMDUR) 60 MG 24 hr tablet TAKE 1 TABLET BY MOUTH DAILY   . Krill Oil 350 MG CAPS Take 350 mg by mouth daily. Move Free Ultra Krill Oil   . lisinopril (PRINIVIL,ZESTRIL) 10 MG tablet Take 10 mg by mouth daily.    . metoprolol succinate (TOPROL-XL) 100 MG 24 hr tablet TAKE 1 TABLET BY MOUTH DAILY   . Multiple Vitamin (MULTIVITAMIN  WITH MINERALS) TABS tablet Take 1 tablet by mouth daily.   . Probiotic Product (PROBIOTIC-10 PO) Take 1 capsule by mouth daily.   . rosuvastatin (CRESTOR) 20 MG tablet TAKE 1 TABLET(20 MG) BY MOUTH DAILY   . Continuous Blood Gluc Sensor (FREESTYLE LIBRE 14 DAY SENSOR) MISC USE UTD Q 14 DAYS SUBCUTANEOUS (Patient not taking: Reported on 06/24/2020)   . nitroGLYCERIN (NITROSTAT) 0.4 MG SL tablet Place 0.4 mg under the tongue every 5 (five) minutes as needed for chest pain. (Patient not taking: Reported on 06/24/2020) 09/04/2016: NEVER USED   No facility-administered encounter medications on file as of 06/24/2020.   He is still taking ASA 81mg  daily  ROS:The patient denies anorexia, fever,headaches, decreased hearing, ear pain, hoarseness, chest pain, palpitations, dizziness, syncope, dyspnea on exertion, cough, swelling, nausea, vomiting, diarrhea, constipation, abdominal pain, melena, hematochezia, indigestion/heartburn, hematuria, incontinence, nocturia, dysuria, genital lesions, joint pains, weakness, tremor, suspicious skin  lesions, depression, anxiety, abnormal bleeding/bruising, or enlarged lymph nodes. Denies significant vision changes. Erectile dysfunction Numbness in feet  Denies any bleeding (other than from recent wound LLE) +weight gain   PHYSICAL EXAM:  BP 130/70   Pulse 60   Ht 5\' 10"  (1.778 m)   Wt (!) 317 lb 12.8 oz (144.2 kg)   BMI 45.60 kg/m   Wt Readings from Last 3 Encounters:  06/24/20 (!) 317 lb 12.8 oz (144.2 kg)  04/19/20 (!) 301 lb (136.5 kg)  01/04/20 290 lb (131.5 kg)    General Appearance:  Alert, cooperative, no distress, appears stated age. Head is bald/shaven  Head:  Normocephalic, without obvious abnormality, atraumatic  Eyes:  PERRL, conjunctiva/corneas clear, EOM's intact, fundi not visualized  Ears:  Normal TM's and external ear canals  Nose: Not examined (wearing mask due to COVID-19 pandemic)  Throat:  Not examined (wearing mask due to COVID-19 pandemic)  Neck: Supple, no lymphadenopathy; thyroid: no enlargement/ tenderness/nodules; no carotid bruit or JVD  Back:  Spine nontender, no curvature, ROM normal, no CVA tenderness  Lungs:  Clear to auscultation bilaterally without wheezes, rales or ronchi; respirations unlabored  Chest Wall:  No tenderness or deformity  Heart:  Regular rate and rhythm, S1 and S2 normal, no murmur, rub or gallop  Breast Exam:  No chest wall tenderness, ormasses   Abdomen:  Soft, non-tender, obese;nondistended, normoactive bowel sounds, no masses, no hepatosplenomegaly  Genitalia:  Normal male external genitalia without lesions. Testicles without masses. No inguinal hernias.  Rectal:  Normal sphincter tone, no masses or tenderness; guaiac negative stool. Prostate smooth, no nodules, not enlarged.  Extremities: Trace edema bilaterally at ankles.  Absence of R 5th toe, WHSS.  Callous at lateral aspect on bottom of R foot, nontender.  Decreased sensation to monofilament both feet (some slight  sensation on toes on R only).  Left medial lower leg, mid-way--there is a 1.5 cm area with central 7-13mm eschar/scab with very mild surrounding erythema. No warmth/streaks/STS.   Pulses: Palpable distal pulses in LE's, diminished somewhat on the left.  Feet are warm  Skin: Turgor normal, nosuspiciouslesions (other than those noted on extremity exam)  Lymph nodes: Cervical, supraclavicular, andinguinal lymphnodes normal  Neurologic:  CNII-XII intact, normal strength, sensation and gait; reflexes 2+ and symmetric throughout.   Psych: Normal mood, affect, hygiene and grooming.  Diabetic foot exam performed (abnormal--see extremity exam)  ASSESSMENT/PLAN:  Annual physical exam - Plan: PSA, Lipid panel, TSH  PAD (peripheral artery disease) ([place]) - under the care of Dr. Einar Gip. No claudication. Concerning lesions on  extremities need to be followed. To DC ASA per Dr. Irven Shelling last note  Diabetic retinopathy associated with type 2 diabetes mellitus, macular edema presence unspecified, unspecified laterality, unspecified retinopathy severity ([place]) - under regular care of specialist, getting injections.  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  Under the care of Dr. Chalmers Cater.  Discussed the potential benefits of pumps (not to be seen as a "threat") - Plan: TSH  Partial nontraumatic amputation of foot, right ([place]) - well healed.  new callous noted on bottom of R foot.  Pt advised to follow regularly with podiatrist, and to schedule appt  Stage 3a chronic kidney disease - stable per pt.  Will request notes/labs from nephro  3-vessel coronary artery disease - on medical management, per Dr. Einar Gip.  Asymptomatic  Mixed hyperlipidemia - Plan:  Lipid panel  Hypertension associated with diabetes ([place]) - controlled  Medication monitoring encounter - Plan: Lipid panel  Class 3 severe obesity due to excess calories with serious comorbidity and body mass index (BMI) of 45.0 to 49.9 in adult [facility]) - Reviewed proper diet, exercise, wt loss rec. Following [employer]. comorbidity--uncontrolled DM, CAD, PAD  Erectile dysfunction, unspecified erectile dysfunction type - not desiring treatment at this time  Screening for prostate cancer - discussed gradual increase, but within normal range. If >0.7 increase, will refer to urology. Discussed process if PSA high/rising quickly  To get records from Dr. Chalmers Cater and [facility].  PSA, lipids,TSH today  Discussed PSA screening (risks/benefits), recommended at least 30 minutes of aerobic activity at least 5 days/week; proper sunscreen use reviewed; healthy diet and alcohol recommendations (less than or equal to 2 drinks/day) reviewed; regular seatbelt use; changing batteries in smoke detectors. Immunization recommendations discussed--continue yearly flu shots. Pneumovax age 2.Colonoscopy recommendations reviewed, was due 11/2019--pt to contact GI  Greater than 60 min FTF with patient plus additional time in chart review and documentation.

## 2020-06-24 ENCOUNTER — Encounter: Payer: Self-pay | Admitting: Family Medicine

## 2020-06-24 ENCOUNTER — Ambulatory Visit (INDEPENDENT_AMBULATORY_CARE_PROVIDER_SITE_OTHER): Payer: BC Managed Care – PPO | Admitting: Family Medicine

## 2020-06-24 VITALS — BP 130/70 | HR 60 | Ht 70.0 in | Wt 317.8 lb

## 2020-06-24 DIAGNOSIS — N529 Male erectile dysfunction, unspecified: Secondary | ICD-10-CM

## 2020-06-24 DIAGNOSIS — E782 Mixed hyperlipidemia: Secondary | ICD-10-CM

## 2020-06-24 DIAGNOSIS — I739 Peripheral vascular disease, unspecified: Secondary | ICD-10-CM | POA: Diagnosis not present

## 2020-06-24 DIAGNOSIS — Z125 Encounter for screening for malignant neoplasm of prostate: Secondary | ICD-10-CM

## 2020-06-24 DIAGNOSIS — E1121 Type 2 diabetes mellitus with diabetic nephropathy: Secondary | ICD-10-CM

## 2020-06-24 DIAGNOSIS — Z794 Long term (current) use of insulin: Secondary | ICD-10-CM

## 2020-06-24 DIAGNOSIS — E1159 Type 2 diabetes mellitus with other circulatory complications: Secondary | ICD-10-CM

## 2020-06-24 DIAGNOSIS — Z89431 Acquired absence of right foot: Secondary | ICD-10-CM

## 2020-06-24 DIAGNOSIS — N1831 Chronic kidney disease, stage 3a: Secondary | ICD-10-CM

## 2020-06-24 DIAGNOSIS — Z5181 Encounter for therapeutic drug level monitoring: Secondary | ICD-10-CM

## 2020-06-24 DIAGNOSIS — E11319 Type 2 diabetes mellitus with unspecified diabetic retinopathy without macular edema: Secondary | ICD-10-CM

## 2020-06-24 DIAGNOSIS — I2510 Atherosclerotic heart disease of native coronary artery without angina pectoris: Secondary | ICD-10-CM

## 2020-06-24 DIAGNOSIS — Z0000 Encounter for general adult medical examination without abnormal findings: Secondary | ICD-10-CM

## 2020-06-24 DIAGNOSIS — Z6841 Body Mass Index (BMI) 40.0 and over, adult: Secondary | ICD-10-CM

## 2020-06-24 DIAGNOSIS — I10 Essential (primary) hypertension: Secondary | ICD-10-CM

## 2020-06-24 DIAGNOSIS — E11311 Type 2 diabetes mellitus with unspecified diabetic retinopathy with macular edema: Secondary | ICD-10-CM

## 2020-06-25 LAB — LIPID PANEL
Chol/HDL Ratio: 4.2 ratio (ref 0.0–5.0)
Cholesterol, Total: 137 mg/dL (ref 100–199)
HDL: 33 mg/dL — ABNORMAL LOW (ref >39)
LDL Chol Calc (NIH): 72 mg/dL (ref 0–99)
Triglycerides: 190 mg/dL — ABNORMAL HIGH (ref 0–149)
VLDL Cholesterol Cal: 32 mg/dL (ref 5–40)

## 2020-06-25 LAB — PSA: Prostate Specific Ag, Serum: 3.8 ng/mL (ref 0.0–4.0)

## 2020-06-25 LAB — TSH: TSH: 1.65 u[IU]/mL (ref 0.450–4.500)

## 2020-07-08 DIAGNOSIS — E1165 Type 2 diabetes mellitus with hyperglycemia: Secondary | ICD-10-CM | POA: Diagnosis not present

## 2020-07-08 DIAGNOSIS — I10 Essential (primary) hypertension: Secondary | ICD-10-CM | POA: Diagnosis not present

## 2020-07-08 DIAGNOSIS — G609 Hereditary and idiopathic neuropathy, unspecified: Secondary | ICD-10-CM | POA: Diagnosis not present

## 2020-07-08 DIAGNOSIS — R809 Proteinuria, unspecified: Secondary | ICD-10-CM | POA: Diagnosis not present

## 2020-07-08 LAB — HEMOGLOBIN A1C: Hemoglobin A1C: 8.8

## 2020-07-09 ENCOUNTER — Other Ambulatory Visit: Payer: Self-pay

## 2020-07-09 ENCOUNTER — Encounter: Payer: Self-pay | Admitting: Podiatry

## 2020-07-09 ENCOUNTER — Ambulatory Visit (INDEPENDENT_AMBULATORY_CARE_PROVIDER_SITE_OTHER): Payer: BC Managed Care – PPO | Admitting: Podiatry

## 2020-07-09 DIAGNOSIS — E119 Type 2 diabetes mellitus without complications: Secondary | ICD-10-CM

## 2020-07-09 DIAGNOSIS — M2042 Other hammer toe(s) (acquired), left foot: Secondary | ICD-10-CM | POA: Diagnosis not present

## 2020-07-09 DIAGNOSIS — Z899 Acquired absence of limb, unspecified: Secondary | ICD-10-CM | POA: Diagnosis not present

## 2020-07-09 DIAGNOSIS — M2041 Other hammer toe(s) (acquired), right foot: Secondary | ICD-10-CM | POA: Diagnosis not present

## 2020-07-09 DIAGNOSIS — D689 Coagulation defect, unspecified: Secondary | ICD-10-CM

## 2020-07-09 DIAGNOSIS — L84 Corns and callosities: Secondary | ICD-10-CM | POA: Diagnosis not present

## 2020-07-09 DIAGNOSIS — E1140 Type 2 diabetes mellitus with diabetic neuropathy, unspecified: Secondary | ICD-10-CM

## 2020-07-10 ENCOUNTER — Encounter: Payer: Self-pay | Admitting: *Deleted

## 2020-07-18 NOTE — Progress Notes (Signed)
Subjective: 64 year old male presents the office today for concerns of a callus on the bottom of his right foot.  Since I last saw him underwent partial fifth ray amputation right foot and he has a callus at the base of the amputation site plantarly.  He has not seen any open sores.  There is also a callus on the left fifth toe. Denies any systemic complaints such as fevers, chills, nausea, vomiting. No acute changes since last appointment, and no other complaints at this time.   Objective: AAO x3, NAD DP/PT pulses palpable bilaterally, CRT less than 3 seconds Sensation decreased with Semmes Weinstein monofilament Hyperkeratotic lesion although minimal right foot lateral aspect is proximal to the amputation site as well as the left fifth toe.  There is no underlying ulceration drainage or signs of infection.  No open lesions identified otherwise.  Hammertoe contractures present. No pain with calf compression, swelling, warmth, erythema  Assessment: 64 year old male with preulcerative calluses, history of irritation, hammertoe deformity  Plan: -All treatment options discussed with the patient including all alternatives, risks, complications.  -Debride the hyperkeratotic lesions x2 without any complications or bleeding.  Continue offloading.  Moisturizer to the feet but not interdigitally.-Follow-up for diabetic shoes, inserts -Patient encouraged to call the office with any questions, concerns, change in symptoms.   Trula Slade DPM

## 2020-07-22 ENCOUNTER — Other Ambulatory Visit: Payer: Self-pay

## 2020-07-22 MED ORDER — ROSUVASTATIN CALCIUM 20 MG PO TABS: 20.0000 mg | ORAL_TABLET | Freq: Every day | ORAL | 3 refills | Status: DC

## 2020-07-22 MED ORDER — METOPROLOL SUCCINATE ER 100 MG PO TB24: 100.0000 mg | ORAL_TABLET | Freq: Every day | ORAL | 0 refills | Status: DC

## 2020-07-22 MED ORDER — LISINOPRIL 10 MG PO TABS: 10.0000 mg | ORAL_TABLET | Freq: Every day | ORAL | 3 refills | Status: DC

## 2020-07-22 MED ORDER — CLOPIDOGREL BISULFATE 75 MG PO TABS: 75.0000 mg | ORAL_TABLET | Freq: Every day | ORAL | 0 refills | Status: DC

## 2020-07-22 MED ORDER — ISOSORBIDE MONONITRATE ER 60 MG PO TB24: 60.0000 mg | ORAL_TABLET | Freq: Every day | ORAL | 3 refills | Status: DC

## 2020-07-22 MED ORDER — FUROSEMIDE 20 MG PO TABS: 20.0000 mg | ORAL_TABLET | Freq: Every day | ORAL | 3 refills | Status: DC

## 2020-07-22 MED ORDER — METOPROLOL SUCCINATE ER 100 MG PO TB24: 100.0000 mg | ORAL_TABLET | Freq: Every day | ORAL | 3 refills | Status: DC

## 2020-07-29 ENCOUNTER — Telehealth: Payer: Self-pay | Admitting: Family Medicine

## 2020-07-29 NOTE — Telephone Encounter (Signed)
Records received from [facility]

## 2020-08-05 ENCOUNTER — Other Ambulatory Visit: Payer: Self-pay

## 2020-08-05 ENCOUNTER — Ambulatory Visit: Payer: BC Managed Care – PPO | Admitting: Orthotics

## 2020-08-05 DIAGNOSIS — M2041 Other hammer toe(s) (acquired), right foot: Secondary | ICD-10-CM | POA: Diagnosis not present

## 2020-08-05 DIAGNOSIS — E1140 Type 2 diabetes mellitus with diabetic neuropathy, unspecified: Secondary | ICD-10-CM | POA: Diagnosis not present

## 2020-08-05 DIAGNOSIS — M2042 Other hammer toe(s) (acquired), left foot: Secondary | ICD-10-CM | POA: Diagnosis not present

## 2020-08-05 DIAGNOSIS — E119 Type 2 diabetes mellitus without complications: Secondary | ICD-10-CM

## 2020-08-05 DIAGNOSIS — Z89422 Acquired absence of other left toe(s): Secondary | ICD-10-CM | POA: Diagnosis not present

## 2020-08-05 NOTE — Progress Notes (Signed)
Patient cast today for Custom orthotics to take pressure off met base/cuboid 5th rt; also plan on valgus wedging to address supination.

## 2020-08-19 DIAGNOSIS — Z23 Encounter for immunization: Secondary | ICD-10-CM | POA: Diagnosis not present

## 2020-08-19 DIAGNOSIS — E1165 Type 2 diabetes mellitus with hyperglycemia: Secondary | ICD-10-CM | POA: Diagnosis not present

## 2020-09-02 ENCOUNTER — Encounter (INDEPENDENT_AMBULATORY_CARE_PROVIDER_SITE_OTHER): Payer: BC Managed Care – PPO | Admitting: Ophthalmology

## 2020-09-09 ENCOUNTER — Other Ambulatory Visit: Payer: Self-pay

## 2020-09-09 ENCOUNTER — Ambulatory Visit: Payer: Medicare Other | Admitting: Orthotics

## 2020-09-09 DIAGNOSIS — E1140 Type 2 diabetes mellitus with diabetic neuropathy, unspecified: Secondary | ICD-10-CM

## 2020-09-09 DIAGNOSIS — M2041 Other hammer toe(s) (acquired), right foot: Secondary | ICD-10-CM

## 2020-09-09 DIAGNOSIS — E119 Type 2 diabetes mellitus without complications: Secondary | ICD-10-CM

## 2020-09-09 DIAGNOSIS — Z899 Acquired absence of limb, unspecified: Secondary | ICD-10-CM

## 2020-09-10 ENCOUNTER — Ambulatory Visit (INDEPENDENT_AMBULATORY_CARE_PROVIDER_SITE_OTHER): Payer: Medicare Other | Admitting: Podiatry

## 2020-09-10 DIAGNOSIS — D689 Coagulation defect, unspecified: Secondary | ICD-10-CM | POA: Diagnosis not present

## 2020-09-10 DIAGNOSIS — M79676 Pain in unspecified toe(s): Secondary | ICD-10-CM

## 2020-09-10 DIAGNOSIS — L84 Corns and callosities: Secondary | ICD-10-CM

## 2020-09-10 DIAGNOSIS — B351 Tinea unguium: Secondary | ICD-10-CM | POA: Diagnosis not present

## 2020-09-10 DIAGNOSIS — E1140 Type 2 diabetes mellitus with diabetic neuropathy, unspecified: Secondary | ICD-10-CM | POA: Diagnosis not present

## 2020-09-11 ENCOUNTER — Encounter (INDEPENDENT_AMBULATORY_CARE_PROVIDER_SITE_OTHER): Payer: Medicare Other | Admitting: Ophthalmology

## 2020-09-11 ENCOUNTER — Other Ambulatory Visit: Payer: Self-pay

## 2020-09-11 DIAGNOSIS — H35033 Hypertensive retinopathy, bilateral: Secondary | ICD-10-CM

## 2020-09-11 DIAGNOSIS — I10 Essential (primary) hypertension: Secondary | ICD-10-CM

## 2020-09-11 DIAGNOSIS — E11311 Type 2 diabetes mellitus with unspecified diabetic retinopathy with macular edema: Secondary | ICD-10-CM | POA: Diagnosis not present

## 2020-09-11 DIAGNOSIS — H43813 Vitreous degeneration, bilateral: Secondary | ICD-10-CM

## 2020-09-11 DIAGNOSIS — E113512 Type 2 diabetes mellitus with proliferative diabetic retinopathy with macular edema, left eye: Secondary | ICD-10-CM

## 2020-09-11 DIAGNOSIS — E113391 Type 2 diabetes mellitus with moderate nonproliferative diabetic retinopathy without macular edema, right eye: Secondary | ICD-10-CM | POA: Diagnosis not present

## 2020-09-11 NOTE — Progress Notes (Signed)
Subjective: 64 year old male presents the office today for concerns of a callus on the bottom of his right foot and for thick, elongated toenails.No open lesions he reports. He picked up new inserts yesterday and getting use to them. Denies any systemic complaints such as fevers, chills, nausea, vomiting. No acute changes since last appointment, and no other complaints at this time.   Objective: AAO x3, NAD DP/PT pulses palpable bilaterally, CRT less than 3 seconds Sensation decreased with Semmes Weinstein monofilament Mild hyperkeratotic lesion to the right foot lateral aspect is proximal to the amputation site as well as the left fifth toe.  There is no underlying ulceration drainage or signs of infection.  No open lesions identified otherwise.  Nails are hypertrophic, dystrophic, brittle, discolored, elongated 9 except the right fifth toe which is been amputated. No surrounding redness or drainage. Tenderness nails 1-5 bilaterally. Hammertoe contractures present. No pain with calf compression, swelling, warmth, erythema  Assessment: 64 year old male with preulcerative calluses, history of amputation, symptomatic onychomycosis  Plan: -All treatment options discussed with the patient including all alternatives, risks, complications.  -Nails debrided x9 without complications or bleeding -Debride the hyperkeratotic lesions x1 without any complications or bleeding.  Continue offloading.  Moisturizer to the feet but not interdigitally. -Daily foot inspection -Patient encouraged to call the office with any questions, concerns, change in symptoms.   Trula Slade DPM

## 2020-09-23 NOTE — Progress Notes (Signed)
Patient came in today to pick up custom made foot orthotics.  The goals were accomplished and the patient reported no dissatisfaction with said orthotics.  Patient was advised of breakin period and how to report any issues. 

## 2020-10-11 ENCOUNTER — Emergency Department (HOSPITAL_COMMUNITY): Payer: Medicare Other

## 2020-10-11 ENCOUNTER — Inpatient Hospital Stay (HOSPITAL_COMMUNITY): Payer: Medicare Other

## 2020-10-11 ENCOUNTER — Inpatient Hospital Stay (HOSPITAL_COMMUNITY)
Admission: EM | Disposition: A | Discharge status: Home / Self Care | Payer: Self-pay | Source: Home / Self Care | Attending: Cardiology

## 2020-10-11 ENCOUNTER — Other Ambulatory Visit: Payer: Self-pay

## 2020-10-11 ENCOUNTER — Inpatient Hospital Stay (HOSPITAL_COMMUNITY)
Admission: EM | Admit: 2020-10-11 | Discharge: 2020-10-15 | DRG: 280 | Disposition: A | Discharge status: Home / Self Care | Payer: Medicare Other | Attending: Cardiology | Admitting: Cardiology

## 2020-10-11 ENCOUNTER — Encounter (HOSPITAL_COMMUNITY): Payer: Self-pay | Admitting: Cardiology

## 2020-10-11 DIAGNOSIS — Z87891 Personal history of nicotine dependence: Secondary | ICD-10-CM

## 2020-10-11 DIAGNOSIS — J9601 Acute respiratory failure with hypoxia: Secondary | ICD-10-CM | POA: Diagnosis present

## 2020-10-11 DIAGNOSIS — M720 Palmar fascial fibromatosis [Dupuytren]: Secondary | ICD-10-CM | POA: Diagnosis present

## 2020-10-11 DIAGNOSIS — R778 Other specified abnormalities of plasma proteins: Secondary | ICD-10-CM

## 2020-10-11 DIAGNOSIS — I5023 Acute on chronic systolic (congestive) heart failure: Secondary | ICD-10-CM | POA: Diagnosis present

## 2020-10-11 DIAGNOSIS — J810 Acute pulmonary edema: Secondary | ICD-10-CM | POA: Diagnosis not present

## 2020-10-11 DIAGNOSIS — E785 Hyperlipidemia, unspecified: Secondary | ICD-10-CM | POA: Diagnosis present

## 2020-10-11 DIAGNOSIS — I2510 Atherosclerotic heart disease of native coronary artery without angina pectoris: Secondary | ICD-10-CM | POA: Diagnosis present

## 2020-10-11 DIAGNOSIS — N1832 Chronic kidney disease, stage 3b: Secondary | ICD-10-CM | POA: Diagnosis present

## 2020-10-11 DIAGNOSIS — E1165 Type 2 diabetes mellitus with hyperglycemia: Secondary | ICD-10-CM | POA: Diagnosis present

## 2020-10-11 DIAGNOSIS — I255 Ischemic cardiomyopathy: Secondary | ICD-10-CM | POA: Diagnosis present

## 2020-10-11 DIAGNOSIS — Z66 Do not resuscitate: Secondary | ICD-10-CM | POA: Diagnosis present

## 2020-10-11 DIAGNOSIS — T68XXXA Hypothermia, initial encounter: Secondary | ICD-10-CM | POA: Diagnosis not present

## 2020-10-11 DIAGNOSIS — I214 Non-ST elevation (NSTEMI) myocardial infarction: Secondary | ICD-10-CM | POA: Diagnosis not present

## 2020-10-11 DIAGNOSIS — Z6841 Body Mass Index (BMI) 40.0 and over, adult: Secondary | ICD-10-CM

## 2020-10-11 DIAGNOSIS — Z95820 Peripheral vascular angioplasty status with implants and grafts: Secondary | ICD-10-CM

## 2020-10-11 DIAGNOSIS — Z8249 Family history of ischemic heart disease and other diseases of the circulatory system: Secondary | ICD-10-CM

## 2020-10-11 DIAGNOSIS — Z20822 Contact with and (suspected) exposure to covid-19: Secondary | ICD-10-CM | POA: Diagnosis present

## 2020-10-11 DIAGNOSIS — E11319 Type 2 diabetes mellitus with unspecified diabetic retinopathy without macular edema: Secondary | ICD-10-CM | POA: Diagnosis present

## 2020-10-11 DIAGNOSIS — Z833 Family history of diabetes mellitus: Secondary | ICD-10-CM

## 2020-10-11 DIAGNOSIS — I4460 Unspecified fascicular block: Secondary | ICD-10-CM | POA: Diagnosis present

## 2020-10-11 DIAGNOSIS — N179 Acute kidney failure, unspecified: Secondary | ICD-10-CM | POA: Diagnosis present

## 2020-10-11 DIAGNOSIS — Z89421 Acquired absence of other right toe(s): Secondary | ICD-10-CM

## 2020-10-11 DIAGNOSIS — Z794 Long term (current) use of insulin: Secondary | ICD-10-CM

## 2020-10-11 DIAGNOSIS — J80 Acute respiratory distress syndrome: Secondary | ICD-10-CM | POA: Diagnosis not present

## 2020-10-11 DIAGNOSIS — E1151 Type 2 diabetes mellitus with diabetic peripheral angiopathy without gangrene: Secondary | ICD-10-CM | POA: Diagnosis present

## 2020-10-11 DIAGNOSIS — E1140 Type 2 diabetes mellitus with diabetic neuropathy, unspecified: Secondary | ICD-10-CM | POA: Diagnosis present

## 2020-10-11 DIAGNOSIS — I213 ST elevation (STEMI) myocardial infarction of unspecified site: Secondary | ICD-10-CM | POA: Diagnosis present

## 2020-10-11 DIAGNOSIS — Z515 Encounter for palliative care: Secondary | ICD-10-CM | POA: Diagnosis not present

## 2020-10-11 DIAGNOSIS — I447 Left bundle-branch block, unspecified: Secondary | ICD-10-CM

## 2020-10-11 DIAGNOSIS — E872 Acidosis: Secondary | ICD-10-CM | POA: Diagnosis present

## 2020-10-11 DIAGNOSIS — I5043 Acute on chronic combined systolic (congestive) and diastolic (congestive) heart failure: Secondary | ICD-10-CM | POA: Diagnosis present

## 2020-10-11 DIAGNOSIS — I130 Hypertensive heart and chronic kidney disease with heart failure and stage 1 through stage 4 chronic kidney disease, or unspecified chronic kidney disease: Secondary | ICD-10-CM | POA: Diagnosis present

## 2020-10-11 DIAGNOSIS — R0603 Acute respiratory distress: Secondary | ICD-10-CM | POA: Diagnosis present

## 2020-10-11 DIAGNOSIS — Z91013 Allergy to seafood: Secondary | ICD-10-CM

## 2020-10-11 DIAGNOSIS — Z9289 Personal history of other medical treatment: Secondary | ICD-10-CM

## 2020-10-11 DIAGNOSIS — I2582 Chronic total occlusion of coronary artery: Secondary | ICD-10-CM | POA: Diagnosis present

## 2020-10-11 DIAGNOSIS — E1122 Type 2 diabetes mellitus with diabetic chronic kidney disease: Secondary | ICD-10-CM | POA: Diagnosis present

## 2020-10-11 DIAGNOSIS — Z452 Encounter for adjustment and management of vascular access device: Secondary | ICD-10-CM

## 2020-10-11 DIAGNOSIS — E875 Hyperkalemia: Secondary | ICD-10-CM | POA: Diagnosis present

## 2020-10-11 DIAGNOSIS — Z9842 Cataract extraction status, left eye: Secondary | ICD-10-CM

## 2020-10-11 DIAGNOSIS — N189 Chronic kidney disease, unspecified: Secondary | ICD-10-CM

## 2020-10-11 DIAGNOSIS — R570 Cardiogenic shock: Secondary | ICD-10-CM

## 2020-10-11 DIAGNOSIS — Z7902 Long term (current) use of antithrombotics/antiplatelets: Secondary | ICD-10-CM

## 2020-10-11 DIAGNOSIS — Z973 Presence of spectacles and contact lenses: Secondary | ICD-10-CM

## 2020-10-11 DIAGNOSIS — Z9841 Cataract extraction status, right eye: Secondary | ICD-10-CM

## 2020-10-11 DIAGNOSIS — Z79899 Other long term (current) drug therapy: Secondary | ICD-10-CM

## 2020-10-11 DIAGNOSIS — Z83438 Family history of other disorder of lipoprotein metabolism and other lipidemia: Secondary | ICD-10-CM

## 2020-10-11 HISTORY — PX: CORONARY/GRAFT ACUTE MI REVASCULARIZATION: CATH118305

## 2020-10-11 HISTORY — PX: LEFT HEART CATH AND CORONARY ANGIOGRAPHY: CATH118249

## 2020-10-11 LAB — CBC WITH DIFFERENTIAL/PLATELET
Abs Immature Granulocytes: 0.09 10*3/uL — ABNORMAL HIGH (ref 0.00–0.07)
Basophils Absolute: 0.2 10*3/uL — ABNORMAL HIGH (ref 0.0–0.1)
Basophils Relative: 1 %
Eosinophils Absolute: 0.5 10*3/uL (ref 0.0–0.5)
Eosinophils Relative: 3 %
HCT: 56.5 % — ABNORMAL HIGH (ref 39.0–52.0)
Hemoglobin: 17.7 g/dL — ABNORMAL HIGH (ref 13.0–17.0)
Immature Granulocytes: 1 %
Lymphocytes Relative: 28 %
Lymphs Abs: 4.8 10*3/uL — ABNORMAL HIGH (ref 0.7–4.0)
MCH: 30 pg (ref 26.0–34.0)
MCHC: 31.3 g/dL (ref 30.0–36.0)
MCV: 95.8 fL (ref 80.0–100.0)
Monocytes Absolute: 1 10*3/uL (ref 0.1–1.0)
Monocytes Relative: 6 %
Neutro Abs: 10.5 10*3/uL — ABNORMAL HIGH (ref 1.7–7.7)
Neutrophils Relative %: 61 %
Platelets: 229 10*3/uL (ref 150–400)
RBC: 5.9 MIL/uL — ABNORMAL HIGH (ref 4.22–5.81)
RDW: 13 % (ref 11.5–15.5)
WBC: 17.1 10*3/uL — ABNORMAL HIGH (ref 4.0–10.5)
nRBC: 0 % (ref 0.0–0.2)

## 2020-10-11 LAB — LACTIC ACID, PLASMA
Lactic Acid, Venous: 1.8 mmol/L (ref 0.5–1.9)
Lactic Acid, Venous: 1.9 mmol/L (ref 0.5–1.9)

## 2020-10-11 LAB — BASIC METABOLIC PANEL
Anion gap: 13 (ref 5–15)
Anion gap: 10 (ref 5–15)
Anion gap: 8 (ref 5–15)
BUN: 32 mg/dL — ABNORMAL HIGH (ref 8–23)
BUN: 33 mg/dL — ABNORMAL HIGH (ref 8–23)
BUN: 39 mg/dL — ABNORMAL HIGH (ref 8–23)
CO2: 23 mmol/L (ref 22–32)
CO2: 27 mmol/L (ref 22–32)
CO2: 23 mmol/L (ref 22–32)
Calcium: 8.8 mg/dL — ABNORMAL LOW (ref 8.9–10.3)
Calcium: 8.6 mg/dL — ABNORMAL LOW (ref 8.9–10.3)
Calcium: 8.6 mg/dL — ABNORMAL LOW (ref 8.9–10.3)
Chloride: 103 mmol/L (ref 98–111)
Chloride: 100 mmol/L (ref 98–111)
Chloride: 106 mmol/L (ref 98–111)
Creatinine, Ser: 1.83 mg/dL — ABNORMAL HIGH (ref 0.61–1.24)
Creatinine, Ser: 1.9 mg/dL — ABNORMAL HIGH (ref 0.61–1.24)
Creatinine, Ser: 2.21 mg/dL — ABNORMAL HIGH (ref 0.61–1.24)
GFR, Estimated: 40 mL/min — ABNORMAL LOW (ref >60)
GFR, Estimated: 39 mL/min — ABNORMAL LOW (ref >60)
GFR, Estimated: 32 mL/min — ABNORMAL LOW (ref >60)
Glucose, Bld: 432 mg/dL — ABNORMAL HIGH (ref 70–99)
Glucose, Bld: 413 mg/dL — ABNORMAL HIGH (ref 70–99)
Glucose, Bld: 319 mg/dL — ABNORMAL HIGH (ref 70–99)
Potassium: 6.2 mmol/L — ABNORMAL HIGH (ref 3.5–5.1)
Potassium: 6.9 mmol/L (ref 3.5–5.1)
Potassium: 4.6 mmol/L (ref 3.5–5.1)
Sodium: 139 mmol/L (ref 135–145)
Sodium: 137 mmol/L (ref 135–145)
Sodium: 137 mmol/L (ref 135–145)

## 2020-10-11 LAB — COMPREHENSIVE METABOLIC PANEL
ALT: 35 U/L (ref 0–44)
AST: 124 U/L — ABNORMAL HIGH (ref 15–41)
Albumin: 3.4 g/dL — ABNORMAL LOW (ref 3.5–5.0)
Alkaline Phosphatase: 65 U/L (ref 38–126)
Anion gap: 12 (ref 5–15)
BUN: 36 mg/dL — ABNORMAL HIGH (ref 8–23)
CO2: 22 mmol/L (ref 22–32)
Calcium: 8.7 mg/dL — ABNORMAL LOW (ref 8.9–10.3)
Chloride: 102 mmol/L (ref 98–111)
Creatinine, Ser: 2.13 mg/dL — ABNORMAL HIGH (ref 0.61–1.24)
GFR, Estimated: 34 mL/min — ABNORMAL LOW (ref >60)
Glucose, Bld: 468 mg/dL — ABNORMAL HIGH (ref 70–99)
Potassium: 6.1 mmol/L — ABNORMAL HIGH (ref 3.5–5.1)
Sodium: 136 mmol/L (ref 135–145)
Total Bilirubin: 0.8 mg/dL (ref 0.3–1.2)
Total Protein: 6.2 g/dL — ABNORMAL LOW (ref 6.5–8.1)

## 2020-10-11 LAB — POCT I-STAT 7, (LYTES, BLD GAS, ICA,H+H)
Acid-base deficit: 3 mmol/L — ABNORMAL HIGH (ref 0.0–2.0)
Bicarbonate: 24.7 mmol/L (ref 20.0–28.0)
Calcium, Ion: 1.28 mmol/L (ref 1.15–1.40)
HCT: 46 % (ref 39.0–52.0)
Hemoglobin: 15.6 g/dL (ref 13.0–17.0)
O2 Saturation: 97 %
Potassium: 6.4 mmol/L (ref 3.5–5.1)
Sodium: 138 mmol/L (ref 135–145)
TCO2: 26 mmol/L (ref 22–32)
pCO2 arterial: 54.1 mmHg — ABNORMAL HIGH (ref 32.0–48.0)
pH, Arterial: 7.267 — ABNORMAL LOW (ref 7.350–7.450)
pO2, Arterial: 105 mmHg (ref 83.0–108.0)

## 2020-10-11 LAB — TROPONIN I (HIGH SENSITIVITY)
Troponin I (High Sensitivity): 97 ng/L — ABNORMAL HIGH (ref <18)
Troponin I (High Sensitivity): 1693 ng/L (ref <18)
Troponin I (High Sensitivity): >27000 ng/L (ref <18)

## 2020-10-11 LAB — MAGNESIUM: Magnesium: 2.5 mg/dL — ABNORMAL HIGH (ref 1.7–2.4)

## 2020-10-11 LAB — COOXEMETRY PANEL
Carboxyhemoglobin: 0.9 % (ref 0.5–1.5)
Carboxyhemoglobin: 0.7 % (ref 0.5–1.5)
Methemoglobin: 1.1 % (ref 0.0–1.5)
Methemoglobin: 1.1 % (ref 0.0–1.5)
O2 Saturation: 74.5 %
O2 Saturation: 72.9 %
Total hemoglobin: 17.2 g/dL — ABNORMAL HIGH (ref 12.0–16.0)
Total hemoglobin: 16.1 g/dL — ABNORMAL HIGH (ref 12.0–16.0)

## 2020-10-11 LAB — GLUCOSE, CAPILLARY
Glucose-Capillary: 369 mg/dL — ABNORMAL HIGH (ref 70–99)
Glucose-Capillary: 387 mg/dL — ABNORMAL HIGH (ref 70–99)

## 2020-10-11 LAB — HEMOGLOBIN A1C
Hgb A1c MFr Bld: 8 % — ABNORMAL HIGH (ref 4.8–5.6)
Mean Plasma Glucose: 182.9 mg/dL

## 2020-10-11 LAB — RESP PANEL BY RT-PCR (FLU A&B, COVID) ARPGX2
Influenza A by PCR: NEGATIVE
Influenza B by PCR: NEGATIVE
SARS Coronavirus 2 by RT PCR: NEGATIVE

## 2020-10-11 LAB — PHOSPHORUS: Phosphorus: 2.2 mg/dL — ABNORMAL LOW (ref 2.5–4.6)

## 2020-10-11 LAB — BRAIN NATRIURETIC PEPTIDE: B Natriuretic Peptide: 575.7 pg/mL — ABNORMAL HIGH (ref 0.0–100.0)

## 2020-10-11 LAB — BETA-HYDROXYBUTYRIC ACID: Beta-Hydroxybutyric Acid: 0.09 mmol/L (ref 0.05–0.27)

## 2020-10-11 LAB — MRSA PCR SCREENING: MRSA by PCR: NEGATIVE

## 2020-10-11 SURGERY — CORONARY/GRAFT ACUTE MI REVASCULARIZATION
Anesthesia: LOCAL

## 2020-10-11 MED ORDER — PROPOFOL 1000 MG/100ML IV EMUL
5.0000 ug/kg/min | INTRAVENOUS | Status: DC
  Administered 2020-10-11 (×2): 20 ug/kg/min via INTRAVENOUS
  Administered 2020-10-11: 10 ug/kg/min via INTRAVENOUS
  Administered 2020-10-11: 5 ug/kg/min via INTRAVENOUS
  Administered 2020-10-11: 20 ug/kg/min via INTRAVENOUS
  Administered 2020-10-11: 40 ug/kg/min via INTRAVENOUS
  Administered 2020-10-12 (×2): 30 ug/kg/min via INTRAVENOUS
  Filled 2020-10-11 (×7): qty 100

## 2020-10-11 MED ORDER — PANTOPRAZOLE SODIUM 40 MG IV SOLR
40.0000 mg | Freq: Every day | INTRAVENOUS | Status: DC
  Administered 2020-10-11 – 2020-10-12 (×2): 40 mg via INTRAVENOUS
  Filled 2020-10-11 (×2): qty 40

## 2020-10-11 MED ORDER — FUROSEMIDE 10 MG/ML IJ SOLN
80.0000 mg | Freq: Two times a day (BID) | INTRAMUSCULAR | Status: DC
  Administered 2020-10-11 – 2020-10-14 (×6): 80 mg via INTRAVENOUS
  Filled 2020-10-11 (×6): qty 8

## 2020-10-11 MED ORDER — HYDRALAZINE HCL 20 MG/ML IJ SOLN: 10.0000 mg | INTRAMUSCULAR | Status: DC | PRN

## 2020-10-11 MED ORDER — CEFAZOLIN SODIUM-DEXTROSE 2-4 GM/100ML-% IV SOLN
INTRAVENOUS | Status: AC
  Filled 2020-10-11: qty 100

## 2020-10-11 MED ORDER — MIDAZOLAM HCL 2 MG/2ML IJ SOLN
INTRAMUSCULAR | Status: AC
  Filled 2020-10-11: qty 2

## 2020-10-11 MED ORDER — ROSUVASTATIN CALCIUM 20 MG PO TABS
20.0000 mg | ORAL_TABLET | Freq: Every day | ORAL | Status: DC
  Administered 2020-10-11 – 2020-10-13 (×3): 20 mg
  Filled 2020-10-11 (×3): qty 1

## 2020-10-11 MED ORDER — CHLORHEXIDINE GLUCONATE CLOTH 2 % EX PADS
6.0000 | MEDICATED_PAD | Freq: Every day | CUTANEOUS | Status: DC
  Administered 2020-10-12 – 2020-10-13 (×2): 6 via TOPICAL

## 2020-10-11 MED ORDER — ROSUVASTATIN CALCIUM 20 MG PO TABS: 20.0000 mg | ORAL_TABLET | Freq: Every day | ORAL | Status: DC

## 2020-10-11 MED ORDER — CLOPIDOGREL BISULFATE 75 MG PO TABS
75.0000 mg | ORAL_TABLET | Freq: Every day | ORAL | Status: DC
  Administered 2020-10-11 – 2020-10-12 (×2): 75 mg
  Filled 2020-10-11 (×2): qty 1

## 2020-10-11 MED ORDER — HEPARIN SODIUM (PORCINE) 5000 UNIT/ML IJ SOLN: 5000.0000 [IU] | Freq: Three times a day (TID) | INTRAMUSCULAR | Status: DC

## 2020-10-11 MED ORDER — CHLORHEXIDINE GLUCONATE 0.12% ORAL RINSE (MEDLINE KIT)
15.0000 mL | Freq: Two times a day (BID) | OROMUCOSAL | Status: DC
  Administered 2020-10-11 – 2020-10-12 (×3): 15 mL via OROMUCOSAL

## 2020-10-11 MED ORDER — INSULIN REGULAR(HUMAN) IN NACL 100-0.9 UT/100ML-% IV SOLN
INTRAVENOUS | Status: DC
  Administered 2020-10-11: 5.5 [IU]/h via INTRAVENOUS
  Administered 2020-10-11: 13 [IU]/h via INTRAVENOUS
  Filled 2020-10-11 (×2): qty 100

## 2020-10-11 MED ORDER — FENTANYL CITRATE (PF) 100 MCG/2ML IJ SOLN
50.0000 ug | Freq: Once | INTRAMUSCULAR | Status: DC
  Filled 2020-10-11 (×2): qty 2

## 2020-10-11 MED ORDER — HEPARIN SODIUM (PORCINE) 5000 UNIT/ML IJ SOLN
4000.0000 [IU] | Freq: Once | INTRAMUSCULAR | Status: AC
  Administered 2020-10-11: 4000 [IU] via INTRAVENOUS
  Filled 2020-10-11: qty 1

## 2020-10-11 MED ORDER — ONDANSETRON HCL 4 MG/2ML IJ SOLN: 4.0000 mg | Freq: Four times a day (QID) | INTRAMUSCULAR | Status: DC | PRN

## 2020-10-11 MED ORDER — PNEUMOCOCCAL VAC POLYVALENT 25 MCG/0.5ML IJ INJ: 0.5000 mL | INJECTION | INTRAMUSCULAR | Status: DC

## 2020-10-11 MED ORDER — FENTANYL CITRATE (PF) 100 MCG/2ML IJ SOLN
100.0000 ug | Freq: Once | INTRAMUSCULAR | Status: AC
  Administered 2020-10-11: 100 ug via INTRAVENOUS
  Filled 2020-10-11: qty 2

## 2020-10-11 MED ORDER — NOREPINEPHRINE 16 MG/250ML-% IV SOLN
0.0000 ug/min | INTRAVENOUS | Status: DC
  Administered 2020-10-11: 6 ug/min via INTRAVENOUS
  Administered 2020-10-12: 5 ug/min via INTRAVENOUS
  Administered 2020-10-12: 16 ug/min via INTRAVENOUS
  Filled 2020-10-11 (×2): qty 250

## 2020-10-11 MED ORDER — VERAPAMIL HCL 2.5 MG/ML IV SOLN
INTRAVENOUS | Status: AC
  Filled 2020-10-11: qty 2

## 2020-10-11 MED ORDER — SODIUM CHLORIDE 0.9 % IV SOLN
250.0000 mL | INTRAVENOUS | Status: DC | PRN
  Administered 2020-10-11: 250 mL via INTRAVENOUS

## 2020-10-11 MED ORDER — IOHEXOL 350 MG/ML SOLN
INTRAVENOUS | Status: AC
  Filled 2020-10-11: qty 1

## 2020-10-11 MED ORDER — HEPARIN SODIUM (PORCINE) 1000 UNIT/ML IJ SOLN
INTRAMUSCULAR | Status: AC
  Filled 2020-10-11: qty 1

## 2020-10-11 MED ORDER — SODIUM CHLORIDE 0.9% FLUSH
3.0000 mL | Freq: Two times a day (BID) | INTRAVENOUS | Status: DC
  Administered 2020-10-11 – 2020-10-15 (×8): 3 mL via INTRAVENOUS

## 2020-10-11 MED ORDER — NITROGLYCERIN 1 MG/10 ML FOR IR/CATH LAB
INTRA_ARTERIAL | Status: AC
  Filled 2020-10-11: qty 10

## 2020-10-11 MED ORDER — SUCCINYLCHOLINE CHLORIDE 200 MG/10ML IV SOSY
PREFILLED_SYRINGE | INTRAVENOUS | Status: AC
  Administered 2020-10-11: 150 mg via INTRAVENOUS
  Filled 2020-10-11: qty 10

## 2020-10-11 MED ORDER — ETOMIDATE 2 MG/ML IV SOLN
INTRAVENOUS | Status: AC
  Administered 2020-10-11: 20 mg via INTRAVENOUS
  Filled 2020-10-11: qty 20

## 2020-10-11 MED ORDER — DEXTROSE 50 % IV SOLN: 0.0000 mL | INTRAVENOUS | Status: DC | PRN

## 2020-10-11 MED ORDER — FENTANYL 2500MCG IN NS 250ML (10MCG/ML) PREMIX INFUSION
50.0000 ug/h | INTRAVENOUS | Status: DC
  Administered 2020-10-11: 50 ug/h via INTRAVENOUS
  Administered 2020-10-11: 25 ug/h via INTRAVENOUS
  Filled 2020-10-11 (×2): qty 250

## 2020-10-11 MED ORDER — ORAL CARE MOUTH RINSE
15.0000 mL | OROMUCOSAL | Status: DC
  Administered 2020-10-11 – 2020-10-12 (×10): 15 mL via OROMUCOSAL

## 2020-10-11 MED ORDER — NOREPINEPHRINE 4 MG/250ML-% IV SOLN
0.0000 ug/min | INTRAVENOUS | Status: DC
  Administered 2020-10-11: 2 ug/min via INTRAVENOUS
  Filled 2020-10-11: qty 250

## 2020-10-11 MED ORDER — SODIUM CHLORIDE 0.9% FLUSH: 3.0000 mL | INTRAVENOUS | Status: DC | PRN

## 2020-10-11 MED ORDER — CALCIUM GLUCONATE-NACL 1-0.675 GM/50ML-% IV SOLN
1.0000 g | Freq: Once | INTRAVENOUS | Status: AC
  Administered 2020-10-11: 1000 mg via INTRAVENOUS
  Filled 2020-10-11: qty 50

## 2020-10-11 MED ORDER — INSULIN DETEMIR 100 UNIT/ML ~~LOC~~ SOLN
30.0000 [IU] | Freq: Two times a day (BID) | SUBCUTANEOUS | Status: DC
  Administered 2020-10-11: 30 [IU] via SUBCUTANEOUS
  Filled 2020-10-11 (×2): qty 0.3

## 2020-10-11 MED ORDER — MIDAZOLAM HCL 2 MG/2ML IJ SOLN
5.0000 mg | Freq: Once | INTRAMUSCULAR | Status: AC
  Administered 2020-10-11: 5 mg via INTRAVENOUS
  Filled 2020-10-11: qty 6

## 2020-10-11 MED ORDER — PROSOURCE TF PO LIQD
90.0000 mL | Freq: Three times a day (TID) | ORAL | Status: DC
  Administered 2020-10-11 – 2020-10-12 (×2): 90 mL
  Filled 2020-10-11 (×2): qty 90

## 2020-10-11 MED ORDER — FUROSEMIDE 10 MG/ML IJ SOLN
40.0000 mg | Freq: Four times a day (QID) | INTRAMUSCULAR | Status: DC
  Administered 2020-10-11 (×2): 40 mg via INTRAVENOUS
  Filled 2020-10-11 (×2): qty 4

## 2020-10-11 MED ORDER — IVABRADINE HCL 5 MG PO TABS
5.0000 mg | ORAL_TABLET | Freq: Two times a day (BID) | ORAL | Status: DC
  Administered 2020-10-11: 5 mg via ORAL
  Filled 2020-10-11: qty 1

## 2020-10-11 MED ORDER — IVABRADINE HCL 5 MG PO TABS: 5.0000 mg | ORAL_TABLET | Freq: Two times a day (BID) | ORAL | Status: DC

## 2020-10-11 MED ORDER — ACETAMINOPHEN 325 MG PO TABS: 650.0000 mg | ORAL_TABLET | ORAL | Status: DC | PRN

## 2020-10-11 MED ORDER — IODIXANOL 320 MG/ML IV SOLN
INTRAVENOUS | Status: DC | PRN
  Administered 2020-10-11: 75 mL via INTRA_ARTERIAL

## 2020-10-11 MED ORDER — AMIODARONE HCL IN DEXTROSE 360-4.14 MG/200ML-% IV SOLN
60.0000 mg/h | INTRAVENOUS | Status: DC
  Administered 2020-10-11: 60 mg/h via INTRAVENOUS
  Filled 2020-10-11 (×2): qty 200

## 2020-10-11 MED ORDER — DEXTROSE 50 % IV SOLN
1.0000 | Freq: Once | INTRAVENOUS | Status: AC
  Administered 2020-10-11: 50 mL via INTRAVENOUS
  Filled 2020-10-11: qty 50

## 2020-10-11 MED ORDER — LIDOCAINE HCL (PF) 1 % IJ SOLN
INTRAMUSCULAR | Status: AC
  Filled 2020-10-11: qty 30

## 2020-10-11 MED ORDER — MAGNESIUM SULFATE 2 GM/50ML IV SOLN
2.0000 g | Freq: Once | INTRAVENOUS | Status: AC
  Administered 2020-10-11: 2 g via INTRAVENOUS
  Filled 2020-10-11: qty 50

## 2020-10-11 MED ORDER — INSULIN ASPART 100 UNIT/ML ~~LOC~~ SOLN: 0.0000 [IU] | SUBCUTANEOUS | Status: DC

## 2020-10-11 MED ORDER — INSULIN ASPART 100 UNIT/ML IV SOLN
10.0000 [IU] | Freq: Once | INTRAVENOUS | Status: AC
  Administered 2020-10-11: 10 [IU] via INTRAVENOUS

## 2020-10-11 MED ORDER — SODIUM ZIRCONIUM CYCLOSILICATE 10 G PO PACK
10.0000 g | PACK | Freq: Three times a day (TID) | ORAL | Status: DC
  Administered 2020-10-11: 10 g via ORAL
  Filled 2020-10-11: qty 1

## 2020-10-11 MED ORDER — FENTANYL BOLUS VIA INFUSION
50.0000 ug | INTRAVENOUS | Status: DC | PRN
  Filled 2020-10-11: qty 50

## 2020-10-11 MED ORDER — AMIODARONE HCL IN DEXTROSE 360-4.14 MG/200ML-% IV SOLN
30.0000 mg/h | INTRAVENOUS | Status: DC
  Administered 2020-10-12 – 2020-10-13 (×3): 30 mg/h via INTRAVENOUS
  Filled 2020-10-11 (×3): qty 200

## 2020-10-11 MED ORDER — HEPARIN (PORCINE) IN NACL 1000-0.9 UT/500ML-% IV SOLN
INTRAVENOUS | Status: DC | PRN
  Administered 2020-10-11 (×2): 500 mL

## 2020-10-11 MED ORDER — CEFAZOLIN SODIUM-DEXTROSE 2-3 GM-%(50ML) IV SOLR
INTRAVENOUS | Status: AC | PRN
  Administered 2020-10-11: 2 g via INTRAVENOUS

## 2020-10-11 MED ORDER — CLOPIDOGREL BISULFATE 75 MG PO TABS: 75.0000 mg | ORAL_TABLET | Freq: Every day | ORAL | Status: DC

## 2020-10-11 MED ORDER — PROPOFOL BOLUS VIA INFUSION
INTRAVENOUS | Status: DC | PRN
  Administered 2020-10-11: 10 mg via INTRAVENOUS
  Administered 2020-10-11: 20 mg via INTRAVENOUS
  Administered 2020-10-11: 10 mg via INTRAVENOUS
  Administered 2020-10-11: 30 mg via INTRAVENOUS

## 2020-10-11 MED ORDER — AMIODARONE LOAD VIA INFUSION
150.0000 mg | Freq: Once | INTRAVENOUS | Status: AC
  Administered 2020-10-11: 150 mg via INTRAVENOUS
  Filled 2020-10-11: qty 83.34

## 2020-10-11 MED ORDER — VITAL AF 1.2 CAL PO LIQD
1000.0000 mL | ORAL | Status: DC
  Administered 2020-10-12: 1000 mL
  Filled 2020-10-11 (×2): qty 1000

## 2020-10-11 MED ORDER — METOPROLOL TARTRATE 5 MG/5ML IV SOLN
5.0000 mg | Freq: Four times a day (QID) | INTRAVENOUS | Status: DC
  Administered 2020-10-11: 5 mg via INTRAVENOUS
  Filled 2020-10-11: qty 5

## 2020-10-11 MED ORDER — HEPARIN (PORCINE) 25000 UT/250ML-% IV SOLN
1750.0000 [IU]/h | INTRAVENOUS | Status: DC
  Administered 2020-10-11: 1300 [IU]/h via INTRAVENOUS
  Administered 2020-10-12: 1500 [IU]/h via INTRAVENOUS
  Administered 2020-10-13: 1750 [IU]/h via INTRAVENOUS
  Filled 2020-10-11 (×3): qty 250

## 2020-10-11 MED ORDER — MIDAZOLAM HCL 2 MG/2ML IJ SOLN
INTRAMUSCULAR | Status: DC | PRN
  Administered 2020-10-11: 4 mg via INTRAVENOUS

## 2020-10-11 MED ORDER — ASPIRIN 81 MG PO CHEW
81.0000 mg | CHEWABLE_TABLET | Freq: Every day | ORAL | Status: DC
  Administered 2020-10-11 – 2020-10-12 (×2): 81 mg
  Filled 2020-10-11 (×2): qty 1

## 2020-10-11 MED ORDER — ASPIRIN 81 MG PO CHEW: 81.0000 mg | CHEWABLE_TABLET | Freq: Every day | ORAL | Status: DC

## 2020-10-11 MED ORDER — SODIUM ZIRCONIUM CYCLOSILICATE 10 G PO PACK
10.0000 g | PACK | Freq: Three times a day (TID) | ORAL | Status: DC
  Administered 2020-10-11 (×2): 10 g
  Filled 2020-10-11 (×3): qty 1

## 2020-10-11 MED ORDER — HEPARIN (PORCINE) IN NACL 1000-0.9 UT/500ML-% IV SOLN
INTRAVENOUS | Status: AC
  Filled 2020-10-11: qty 1000

## 2020-10-11 MED ORDER — ACETAMINOPHEN 325 MG PO TABS
650.0000 mg | ORAL_TABLET | ORAL | Status: DC | PRN
  Administered 2020-10-11: 650 mg
  Filled 2020-10-11: qty 2

## 2020-10-11 MED ORDER — ASPIRIN 300 MG RE SUPP
300.0000 mg | Freq: Once | RECTAL | Status: AC
  Administered 2020-10-11: 300 mg via RECTAL
  Filled 2020-10-11: qty 1

## 2020-10-11 SURGICAL SUPPLY — 12 items
CATH INFINITI 5FR JL4 (CATHETERS) ×2 IMPLANT
CATH INFINITI 5FR MPB2 (CATHETERS) ×2 IMPLANT
CLOSURE PERCLOSE PROSTYLE (VASCULAR PRODUCTS) ×2 IMPLANT
ELECT DEFIB PAD ADLT CADENCE (PAD) ×2 IMPLANT
KIT HEART LEFT (KITS) ×2 IMPLANT
KIT MICROPUNCTURE NIT STIFF (SHEATH) ×2 IMPLANT
MAT PREVALON FULL STRYKER (MISCELLANEOUS) ×2 IMPLANT
PACK CARDIAC CATHETERIZATION (CUSTOM PROCEDURE TRAY) ×2 IMPLANT
SHEATH PINNACLE 6F 10CM (SHEATH) ×2 IMPLANT
SHEATH PROBE COVER 6X72 (BAG) ×2 IMPLANT
TRANSDUCER W/STOPCOCK (MISCELLANEOUS) ×2 IMPLANT
TUBING CIL FLEX 10 FLL-RA (TUBING) ×2 IMPLANT

## 2020-10-11 NOTE — Progress Notes (Signed)
Significant other at bedside has taken patient's personal belongings.  A watch, phone, and car keys are now with significant other.

## 2020-10-11 NOTE — H&P (Signed)
CARDIOLOGY ADMIT NOTE   Patient ID: Eduardo [name] MRN: [id number] DOB/AGE: Oct 06, 1956 64 y.o.  Admit date: 10/11/2020 Primary Physician:  Rita Ohara, MD  Patient ID: [name], male    DOB: October 28, 1956, 64 y.o.   MRN: [id number]  Chief Complaint  Patient presents with  . Respiratory Distress   HPI:    Eduardo [name]  is a 64 y.o. Caucasian male  with controlled type 2 diabetes, hypertension, hyperlipidemia, coronary artery disease by angiography on 09/08/2016 revealing severe triple-vessel CAD with no targets for CABG, presently on aggressive medical therapy,mild carotid atherosclerosis, right SFA occlusion S/P complex PV angiogram on 02/28/2019 with PTA to occluded right SFA with implantation of 3 overlapping 6.0 x 120 x2; 6.0 x 100 mm Eluvia DES for critical right leg ischemia now resolved. 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 He has mild disease in the left leg.  Patient activated EMS this morning around 430/5:00 AM, stating that he is unable to breathe.  He was able to only speak 1 or 2 words, stated that since 2 AM he has not been able to breathe.  Denies any chest pain, he was in acute respiratory distress and was brought to the emergency room where he was intubated immediately upon arrival due to severe hypoxemia.  On EKG evaluation, he was found to have new left bundle branch block.  In view of his cardiac history, new onset left bundle, I was called to evaluate for possible STEMI.  No history is available as patient is intubated.  Past Medical History:  Diagnosis Date  . Chronic kidney disease    stage 3  . Colon polyp   . Coronary artery disease   . Diabetes mellitus 1987   under care of Dr. Chalmers Cater.  On insulin since 96 (off and on)  . Diabetic retinopathy   . Dupuytren contracture    R hand, s/p injection (Dr. Lenon Curt)  . Essential hypertension, benign   . Essential hypertension, benign 02/06/2019  . Frequency of urination and polyuria   .  Hypertension   . Myocardial infarction [facility])    denies  . Neuromuscular disorder ([place])    Diabetic neuropathy  . Osteomyelitis ([place])    right foot  . Other testicular hypofunction   . Peripheral arterial disease ([place]) 10/28/2012  . Peritoneal abscess ([place]) 6/08   and buttock.  . Pneumonia   . Polydipsia   . Proteinuria   . Pure hyperglyceridemia   . Subacute osteomyelitis, right ankle and foot ([place])   . Wears glasses    Past Surgical History:  Procedure Laterality Date  . ABDOMINAL AORTAGRAM N/A 04/18/2012   Procedure: ABDOMINAL Maxcine Ham;  Surgeon: Angelia Mould, MD;  Location: [facility] CATH LAB;  Service: Cardiovascular;  Laterality: N/A;  . AMPUTATION Right 05/19/2019   Procedure: RIGHT FOOT 5TH RAY AMPUTATION;  Surgeon: Newt Minion, MD;  Location: [place];  Service: Orthopedics;  Laterality: Right;  . CARDIAC CATHETERIZATION N/A 09/08/2016   Procedure: Left Heart Cath and Coronary Angiography;  Surgeon: Adrian Prows, MD;  Location: [place] CV LAB;  Service: Cardiovascular;  Laterality: N/A;  . CATARACT EXTRACTION, BILATERAL  09/2017, 10/2017   Dr. Herbert Deaner  . COLONOSCOPY W/ BIOPSIES AND POLYPECTOMY    . LOWER EXTREMITY ANGIOGRAM Bilateral 04/18/2012   Procedure: LOWER EXTREMITY ANGIOGRAM;  Surgeon: Angelia Mould, MD;  Location[facility] CATH LAB;  Service: Cardiovascular;  Laterality: Bilateral;  bilat lower extrem angio  . LOWER EXTREMITY  ANGIOGRAPHY Bilateral 05/02/2019   Procedure: LOWER EXTREMITY ANGIOGRAPHY;  Surgeon: Adrian Prows, MD;  Location: [place] CV LAB;  Service: Cardiovascular;  Laterality: Bilateral;  . LOWER EXTREMITY ANGIOGRAPHY Bilateral 02/28/2019   Procedure: LOWER EXTREMITY ANGIOGRAPHY;  Surgeon: Adrian Prows, MD;  Location: [place] CV LAB;  Service: Cardiovascular;  Laterality: Bilateral;  . macular photocoagulation     (eye treatments for diabetic retinopathy)-Dr. Zigmund Daniel  . PERIPHERAL VASCULAR INTERVENTION  02/28/2019   Procedure: PERIPHERAL  VASCULAR INTERVENTION;  Surgeon: Adrian Prows, MD;  Location: [place] CV LAB;  Service: Cardiovascular;;   Social History   Socioeconomic History  . Marital status: Widowed    Spouse name: Not on file  . Number of children: 0  . Years of education: Not on file  . Highest education level: Not on file  Occupational History  . 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 Smoking status: Former Smoker    Packs/day: 1.00    Years: 30.00    Pack years: 30.00    Types: Cigarettes    Quit date: 01/08/2012    Years since quitting: 8.7  . Smokeless tobacco: Never Used  Vaping Use  . Vaping Use: Never used  Substance and Sexual Activity  . Alcohol use: Yes    Comment: rare  . Drug use: No  . Sexual activity: Yes    Partners: Female    Birth control/protection: Condom  Other Topics Concern  . Not on file  Social History Narrative   Widowed.    Girlfriends stays with him on weekends   Previously traveled 40 weeks out of the year. No longer travels at all (since COVID none, cut back on travel in 2019). No pets   Working from home.   Social Determinants of Health   Financial Resource Strain:   . Difficulty of Paying Living Expenses: Not on file  Food Insecurity:   . Worried About Charity fundraiser in the Last Year: Not on file  . Ran Out of Food in the Last Year: Not on file  Transportation Needs:   . Lack of Transportation (Medical): Not on file  . Lack of Transportation (Non-Medical): Not on file  Physical Activity:   . Days of Exercise per Week: Not on file  . Minutes of Exercise per Session: Not on file  Stress:   . Feeling of Stress : Not on file  Social Connections:   . Frequency of Communication with Friends and Family: Not on file  . Frequency of Social Gatherings with Friends and Family: Not on file  . Attends Religious Services: Not on file  . Active Member of Clubs or Organizations: Not on file  .  Attends Archivist Meetings: Not on file  . Marital Status: Not on file  Intimate Partner Violence:   . Fear of Current or Ex-Partner: Not on file  . Emotionally Abused: Not on file  . Physically Abused: Not on file  . Sexually Abused: Not on file   Family History  Problem Relation Age of Onset  . Diabetes Mother   . Hearing loss Mother   . Hypertension Mother   . Hyperlipidemia Mother   . Heart disease Mother   . Varicose Veins Mother   . Varicose Veins Father   . Dementia Father   . Hyperlipidemia Brother   . Diabetes Maternal Grandmother     ROS  Review of Systems  Unable  to perform ROS: intubated   Objective   Vitals with BMI 10/11/2020 10/11/2020 10/11/2020  Height - - -  Weight - - -  BMI - - -  Systolic 469 - 629  Diastolic 86 - 48  Pulse 528 112 121      Physical Exam Constitutional:      Appearance: He is morbidly obese.     Interventions: He is sedated and intubated.     Comments: Morbidly obese  HENT:     Head: Atraumatic.  Neck:     Thyroid: No thyromegaly.     Comments: Short neck and difficult to evaluate JVP Cardiovascular:     Pulses:          Carotid pulses are 2+ on the right side and 2+ on the left side.      Popliteal pulses are 0 on the right side and 2+ on the left side.       Dorsalis pedis pulses are 0 on the right side and 0 on the left side.       Posterior tibial pulses are 0 on the right side and 0 on the left side.     Heart sounds: Heart sounds are distant. No murmur heard.  No gallop.      Comments: Femoral and popliteal pulse difficult to feel due to patient's body habitus. Pulmonary:     Effort: He is intubated.     Breath sounds: Wheezing (diffuse) and rhonchi (diffuse bilateral) present.  Abdominal:     Palpations: Abdomen is soft.     Comments: Obese. Pannus present  Musculoskeletal:        General: Normal range of motion.     Cervical back: Neck supple.  Skin:    General: Skin is dry.    Laboratory  examination:   No results for input(s): NA, K, CL, CO2, GLUCOSE, BUN, CREATININE, CALCIUM, GFRNONAA, GFRAA in the last 8760 hours. CrCl cannot be calculated (Patient's most recent lab result is older than the maximum 21 days allowed.).  CMP Latest Ref Rng & Units 05/19/2019 04/21/2019 02/23/2019  Glucose 70 - 99 mg/dL 112(H) 140(H) 85  BUN 8 - 23 mg/dL 35(H) 31(H) 32(H)  Creatinine 0.61 - 1.24 mg/dL 1.27(H) 1.31(H) 1.40(H)  Sodium 135 - 145 mmol/L 137 139 142  Potassium 3.5 - 5.1 mmol/L 4.4 4.9 4.6  Chloride 98 - 111 mmol/L 107 104 103  CO2 22 - 32 mmol/L 19(L) 22 24  Calcium 8.9 - 10.3 mg/dL 8.5(L) 8.6 9.2  Total Protein 6.0 - 8.5 g/dL - 5.8(L) -  Total Bilirubin 0.0 - 1.2 mg/dL - 0.4 -  Alkaline Phos 39 - 117 IU/L - 68 -  AST 0 - 40 IU/L - 21 -  ALT 0 - 44 IU/L - 20 -   CBC Latest Ref Rng & Units 10/11/2020 05/19/2019 04/21/2019  WBC 4.0 - 10.5 K/uL 17.1(H) 8.5 7.7  Hemoglobin 13.0 - 17.0 g/dL 17.7(H) 14.3 15.3  Hematocrit 39 - 52 % 56.5(H) 43.0 44.9  Platelets 150 - 400 K/uL 229 186 198   Lipid Panel     Component Value Date/Time   CHOL 137 06/24/2020 0937   TRIG 190 (H) 06/24/2020 0937   HDL 33 (L) 06/24/2020 0937   CHOLHDL 4.2 06/24/2020 0937   CHOLHDL 4.4 09/28/2011 1358   VLDL 21 09/28/2011 1358   LDLCALC 72 06/24/2020 0937   HEMOGLOBIN A1C Lab Results  Component Value Date   HGBA1C 8.8 07/08/2020   MPG 192 (H)  09/28/2011   TSH Recent Labs    06/24/20 0937  TSH 1.650   BNP (last 3 results) No results for input(s): BNP in the last 8760 hours.    Ref Range & Units 05:00 10/11/20   Troponin I (High Sensitivity) <18 ng/L 97High         Medications and allergies   Allergies  Allergen Reactions  . Food     Mussels=nausea/vomiting    No current facility-administered medications on file prior to encounter.   Current Outpatient Medications on File Prior to Encounter  Medication Sig Dispense Refill  . B-D UF III MINI PEN NEEDLES 31G X 5 MM MISC U UTD 6  TIMES A DAY  0  . Bioflavonoid Products (ESTER-C) 1000-50 MG TABS Take 1 tablet by mouth daily.     . Cholecalciferol (VITAMIN D) 2000 units CAPS Take 2,000 Units by mouth daily.    . clopidogrel (PLAVIX) 75 MG tablet Take 1 tablet (75 mg total) by mouth daily. 90 tablet 0  . Continuous Blood Gluc Sensor (FREESTYLE LIBRE 14 DAY SENSOR) MISC USE UTD Q 14 DAYS SUBCUTANEOUS (Patient not taking: Reported on 06/24/2020)    . furosemide (LASIX) 20 MG tablet Take 1 tablet (20 mg total) by mouth daily. 90 tablet 3  . gemfibrozil (LOPID) 600 MG tablet Take 600 mg by mouth 2 (two) times daily before a meal.      . HUMALOG KWIKPEN 100 UNIT/ML KiwkPen Inject 0-20 Units into the skin 2 (two) times daily. SLIDING SCALE DEPENDING ON BLOOD GLUCOSE BEFORE MEAL  1  . HUMULIN N KWIKPEN 100 UNIT/ML Kiwkpen Inject 30-40 Units into the skin See admin instructions. Inject 45 units subcutaneously in the morning & 45units in the evening.  1  . isosorbide mononitrate (IMDUR) 60 MG 24 hr tablet Take 1 tablet (60 mg total) by mouth daily. 90 tablet 3  . Krill Oil 350 MG CAPS Take 350 mg by mouth daily. Move Free Ultra Krill Oil    . lisinopril (ZESTRIL) 10 MG tablet Take 1 tablet (10 mg total) by mouth daily. 90 tablet 3  . metoprolol succinate (TOPROL-XL) 100 MG 24 hr tablet Take 1 tablet (100 mg total) by mouth daily. Take with or immediately following a meal. 90 tablet 0  . Multiple Vitamin (MULTIVITAMIN WITH MINERALS) TABS tablet Take 1 tablet by mouth daily.    . nitroGLYCERIN (NITROSTAT) 0.4 MG SL tablet Place 0.4 mg under the tongue every 5 (five) minutes as needed for chest pain. (Patient not taking: Reported on 06/24/2020)  1  . Probiotic Product (PROBIOTIC-10 PO) Take 1 capsule by mouth daily.    . rosuvastatin (CRESTOR) 20 MG tablet Take 1 tablet (20 mg total) by mouth daily. 90 tablet 3     No intake/output data recorded. No intake/output data recorded.    Radiology:  DG Abdomen 1 View  Result Date:  10/11/2020 CLINICAL DATA:  Nasogastric tube placement EXAM: ABDOMEN - 1 VIEW COMPARISON:  None FINDINGS: Nasogastric tube tip is seen within the left upper quadrant the abdomen within the expected mid body of the stomach. The abdomen is largely obscured. Perihilar pulmonary infiltrate again noted. IMPRESSION: Nasogastric tube within the mid body of the stomach. Electronically Signed   By: Fidela Salisbury MD   On: 10/11/2020 05:35   DG Chest Port 1 View  Result Date: 10/11/2020 CLINICAL DATA:  Respiratory failure EXAM: PORTABLE CHEST 1 VIEW COMPARISON:  None. FINDINGS: Endotracheal tube is seen 6.4 cm above the carina  at the level of the clavicular heads. Nasogastric tube extends into the upper abdomen beyond the margin of the examination. The lungs are symmetrically well expanded. No pneumothorax or pleural effusion. There is marked bilateral perihilar interstitial pulmonary infiltrate, most in keeping with marked pulmonary edema, possibly cardiogenic in nature. IMPRESSION: Marked perihilar interstitial pulmonary edema, possibly cardiogenic in nature. Endotracheal tube 6.4 cm above the carina. Electronically Signed   By: Fidela Salisbury MD   On: 10/11/2020 05:33    Cardiac Studies:   Coronary angiogram 09/08/2016: 1. Low normal LV systolic function, EF 99-24% with apical akinesis to dyskinesis. 2. Moderate coronary artery calcification, diffuse 3. Mid LAD occluded and collateralized by faint ipsilateral and contralateral collaterals from RCA. Diagonal 1 is moderate sized with ostial 30% stenosis, diagonal 2 is small to moderate sized and bifurcates, proximal segment has 80% stenosis 4. Moderate to circumflex, giving origin to moderate sized OM 1 which bifurcates. OM1 is occluded and has ipsilateral collaterals. 5. Large RCA. Dominant. Proximal 30%. Calcified. Gives origin to 4 PL branches, PL 1 large with ostial 80% stenosis, PL 2 small, PLT 3 large and has a long segment occlusion and collaterals from  both left and ipsilateral collaterals. PL 4 is moderate-sized mild disease. 6. Patent LIMA and RIMA.  Recommendation: Patient has complex coronary artery disease, however the LAD and circumflex coronary artery may be amenable for coronary angioplasty, right coronary artery is diffusely diseased and hence only would recommend medical therapy. CABG with LIMA to LAD and potential use of a arterial conduit to the circumflex is also good option in a patient with diabetes mellitus. Right coronary artery does not appear to be amenable for the CABG. Will discuss with colleagues. Patient with morbid obesity, uncontrolled diabetes mellitus needs aggressive risk modification. He'll be discharged home with outpatient management for now.  Echocardiogram 09/01/2016: Left ventricle cavity is normal in size. Moderate concentric hypertrophy of the left ventricle. Mild to moderately reduced left ventricular function. Left ventricle regional wall motion findings: Poor echo window and reduced sensitivity. There is inferior hypokinesis, lateral hypokinesis, mild global hypokinesis. Grade 1 diastolic dysfunction. Calculated EF 40%. Left atrial cavity is mildly dilated at 4.2 cm. IVC is dilated with poor inspiration collapse consistent with elevated right atrial pressure.  Exercise sestamibi stress test 08/17/2016: 1. Resting EKG demonstrates normal sinus rhythm, left axis deviation, left plantar fascicular block. Poor R-wave progression, ulnar disease pattern. Nonspecific ST-T abnormality, cannot exclude lateral ischemia. Stress EKG is equivocal for ischemia, patient developing atypical left otherwise for with exercise which reverted back to baseline immediately on combination of the stress test less than 60 seconds. There was no additional ST-T wave changes of ischemia. Patient exercised on Bruce protocol for 4:25 minutes and achieved 5.45 METS. Stress test terminated due to 89 % MPHR achieved (Target HR >85%).  Symptoms included dizziness.  2. 2-Day protocol followed. Perfusion imaging studies demonstrate large sized severe perfusion defect involving the inferior, anterior and anterolateral consistent with inferior wall scar with moderate peri-infarct ischemia and severe anterior and anteroapical reversible ischemia extending from the base towards the apex. Left ventricular systolic function calculating by QGS was markedly depressed at 26%. This is a high risk study, consider further cardiac work-up.  Carotid artery duplex 02/16/2018: No hemodynamically significant arterial disease in the internal carotid artery bilaterally. Mild heterogenous plaque noted.  Antegrade right vertebral artery flow. Antegrade left vertebral artery flow. Compared to the study done on 09/01/2016, left carotid noted as occlusion is an error. This  may perhaps be due to low velocity noted in both carotid arteries and may indicate low systemic BP or reduced cardiac output. Clinical correlation recommended.  Peripheral arteriogram 02/28/2019: Pelvic aortogram with limited bifemoral arteriogram revealed patent distal abdominal aorta without aneurysm, patent, bilateral iliac and common femoral vessels. Right SFA is occluded in the ostium and reconstitutes outside of the Hunter's canal.  Below the right knee there is two-vessel runoff, AT is occluded.  Left leg was not studied beyond left common femoral artery. Successful PTA and stenting of right SFA, prolonged procedure, difficult procedure with use of multiple balloons, guidewires and catheters.  100% stenosis reduced to 0% with implantation of 3 overlapping 6.0 x 120 x2; 6.0 x 100 mm Eluvia DES. 180 mL contrast utilized.  Peripheral arteriogram 05/02/2019: Right common femoral artery and right proximal SFA previously placed stent widely patent.  Right iliac artery shows mild disease. Left iliac artery and left femoral arteries show very mild disease.  There is two-vessel runoff  in the left leg, left AT is occluded. Intermediate stenosis noted in the left distal and proximal SFA, pressure pullback reveals no significant gradient.  ABI09/30/2020: This exam reveals normal perfusion of the right lower extremity (ABI). This exam reveals normal perfusion of the left lower extremity (ABI).  The ABI may be falsely elevated due to medial calcinosis from diabetes. No significant change since 05/11/2019. Patient has h/o right SFA stenting.   EKG:  EKG 08/11/2020: Sinus tachycardia at rate of 120 bpm, atypical left bundle branch block.  No further analysis.  EKG 04/19/2020: Normal sinus rhythm at rate of 61 bpm, inferior infarct old.  Anteroseptal infarct old.  Nonspecific T abnormality.  Borderline low voltage complexes.  Assessment   1.  Respiratory distress and acute pulmonary edema, cardiogenic. 2.  Acute on chronic systolic heart failure 3.  Ischemic cardiomyopathy 4.  Coronary artery disease, diffuse diabetic multivessel disease as noted above 5.  Diabetes mellitus type 2 uncontrolled without hyperglycemia 6.  Primary hypertension 7.  Hypercholesterolemia 8.  Peripheral arterial disease 9.  Morbid obesity  Recommendations:   Patient with new EKG abnormalities suggestive of new left bundle branch block, presenting with acute pulmonary edema, has known severe triple-vessel coronary artery disease.  Not much history is available as he was by himself at home and activated the EMS.  Patient was stabilized in the emergency room with intubation, hemodynamics appear stable, will take him emergently to the cardiac catheterization lab to evaluate his coronary anatomy.  We'll address his heart failure as we go along and try to optimize his diabetes control.  He is presently on a statin and PAD has been stable.  Critical care time spent 40 minutes.    Adrian Prows, MD, [facility] 10/11/2020, 5:58 AM Office: [phone] Pager: [phone]

## 2020-10-11 NOTE — Progress Notes (Signed)
CSW received consult to help locate family. CSW called patients home/Cell phone number and patients friend Marzetta Board answered. She told CSW she just left the hospital and has patients cell phone. She said she is trying to locate POA paperwork for patient.Patients friend said we should be able to call her cell phone from now on that is on file. She cleared out her voicemail also.No further questions reported at this time. CSW to continue to follow and assist with discharge planning needs.

## 2020-10-11 NOTE — Progress Notes (Signed)
Attempted to call Emergency Contact, it was a wrong number.  Kathleene Hazel RN

## 2020-10-11 NOTE — Progress Notes (Signed)
RT attempted to get ABG but the pt kept jerking his arm. I got a little bit and attempted to run it with the little I got but it wasn't enough. Pt is now in cath lab.

## 2020-10-11 NOTE — Progress Notes (Signed)
Palliative Medicine RN Note: Palliative Consult order noted. Placed TOC order to locate NOK/family or document due diligence.   Once family is located, we can conduct a family meeting.   Marjie Skiff Jewel Mcafee, RN, BSN, [facility] Palliative Medicine Team 10/11/2020 10:35 AM Office [phone]

## 2020-10-11 NOTE — Progress Notes (Signed)
Initial Nutrition Assessment  DOCUMENTATION CODES:   Obesity unspecified  INTERVENTION:   Tube feeding:  -Vital AF 1.2 @ 20 ml/hr via OG -Increase by 10 ml Q6 hours to goal rate of 50 ml/hr  -90 ml ProSource TID  Provides: 1680 kcals (2136 kcal with propofol), 156 grams protein, 973 ml free water.   NUTRITION DIAGNOSIS:   Increased nutrient needs related to acute illness as evidenced by estimated needs.  GOAL:   Patient will meet greater than or equal to 90% of their needs   MONITOR:   Vent status, Skin, TF tolerance, Weight trends, Labs, I & O's  REASON FOR ASSESSMENT:   Ventilator    ASSESSMENT:   Patient with PMH significant for CKD III-IV, DM, PVD, prior amputation 5th R toe, and CVD. Presents this admission with STEMI and pulmonary edema.   Pt discussed during ICU rounds and with RN.   Weaning pressors. Unable to obtain recent nutrition/weight history. Xray confirm OG side port located in body of stomach. Per CCM NP, okay to start feeding. Titrate to goal.   Weight shows gradual increase over the last year. Suspect fluid component. Utilize 144 kg as EDW for now.   Patient is currently intubated on ventilator support MV: 11.9 L/min Temp (24hrs), Avg:98.8 F (37.1 C), Min:97.3 F (36.3 C), Max:100.4 F (38 C)  Propofol: 17.3 ml/hr- provides 456 kcal    Drips: insulin, levophed, propofol  Medications: 80 mg lasix BID, lokelma Labs: Cr 2.21-trending up CBG 224-825  NUTRITION - FOCUSED PHYSICAL EXAM:    Most Recent Value  Orbital Region No depletion  Upper Arm Region No depletion  Thoracic and Lumbar Region Unable to assess  Buccal Region No depletion  Temple Region No depletion  Clavicle Bone Region No depletion  Clavicle and Acromion Bone Region No depletion  Scapular Bone Region Unable to assess  Dorsal Hand No depletion  Patellar Region No depletion  Anterior Thigh Region No depletion  Posterior Calf Region No depletion  Edema (RD Assessment)  Mild  Hair Reviewed  Eyes Unable to assess  Mouth Unable to assess  Skin Reviewed  Nails Reviewed     Diet Order:   Diet Order            Diet NPO time specified  Diet effective now                 EDUCATION NEEDS:   Not appropriate for education at this time  Skin:     Last BM:     Height:   Ht Readings from Last 1 Encounters:  10/11/20 5\' 10"  (1.778 m)    Weight:   Wt Readings from Last 1 Encounters:  10/11/20 (!) 144 kg    BMI:  Body mass index is 45.55 kg/m.  Estimated Nutritional Needs:   Kcal:  0037-0488  Protein:  151-189 grams  Fluid:  >/= 1.5 L/day  Eduardo [name] RD, LDN Clinical Nutrition Pager listed in [place]

## 2020-10-11 NOTE — Progress Notes (Addendum)
CRITICAL VALUE ALERT  Critical Value: K+ 6.9, Troponin 1,693  Date & Time Notied:  0953  Provider Notified: Dr. Ina Homes   Orders Received/Actions taken: No new orders

## 2020-10-11 NOTE — ED Provider Notes (Signed)
[facility] EMERGENCY DEPARTMENT Provider Note   CSN: [id number] Arrival date & time: 10/11/20  9323   History Chief Complaint  Patient presents with  . Respiratory Distress    Eduardo [name] is a 64 y.o. male.  The history is provided by the EMS personnel. The history is limited by the condition of the patient (Severe respiratory distress).  He has history of hypertension, diabetes, coronary artery disease, peripheral artery disease and is brought in by ambulance because of severe respiratory distress.  Apparently, he woke up at about 2 AM with difficulty breathing and called for an ambulance at about 4 AM.  EMS noted severe respiratory distress with oxygen saturation of 60% on room air, improved to 66% with applying oxygen via nonrebreather mask.  The put him on CPAP and pain to the ED.  Very little history was able to be obtained.  Patient was not able to answer any questions on arrival in the ED.  Past Medical History:  Diagnosis Date  . Chronic kidney disease    stage 3  . Colon polyp   . Coronary artery disease   . Diabetes mellitus 1987   under care of Dr. Chalmers Cater.  On insulin since 96 (off and on)  . Diabetic retinopathy   . Dupuytren contracture    R hand, s/p injection (Dr. Lenon Curt)  . Essential hypertension, benign   . Essential hypertension, benign 02/06/2019  . Frequency of urination and polyuria   . Hypertension   . Myocardial infarction [facility])    denies  . Neuromuscular disorder ([place])    Diabetic neuropathy  . Osteomyelitis ([place])    right foot  . Other testicular hypofunction   . Peripheral arterial disease ([place]) 10/28/2012  . Peritoneal abscess ([place]) 6/08   and buttock.  . Pneumonia   . Polydipsia   . Proteinuria   . Pure hyperglyceridemia   . Subacute osteomyelitis, right ankle and foot ([place])   . Wears glasses     Patient Active Problem List   Diagnosis Date Noted  . 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  Ischemic ulcer of lower leg due to atherosclerosis ([place]) 02/28/2019  . Essential hypertension, benign 02/06/2019  . Hypercholesteremia 02/06/2019  . CKD (chronic kidney disease) stage 3, GFR 30-59 ml/min (HCC) 10/06/2017  . Proteinuria 10/06/2017  . 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 3-vessel coronary artery disease 05/23/2017  . Abnormal nuclear stress test 09/06/2016  . PAD (peripheral artery disease) ([place]) 10/28/2012  . Morbid obesity with BMI of 40.0-44.9, adult ([place]) 10/13/2012  . Diabetes mellitus with ophthalmic complication ([place]) 55/73/2202  . Hypertension associated with diabetes ([place]) 10/13/2012  . Atherosclerosis of native artery of extremity with intermittent claudication ([place]) 04/05/2012  . Adenomatous colon polyp 09/28/2011  . Pure hyperglyceridemia 09/28/2011  . Erectile dysfunction 09/28/2011  . Diabetic retinopathy ([place]) 09/28/2011    Past Surgical History:  Procedure Laterality Date  . ABDOMINAL AORTAGRAM N/A 04/18/2012   Procedure: ABDOMINAL Maxcine Ham;  Surgeon: Angelia Mould, MD;  [facility];  Service: Cardiovascular;  Laterality: N/A;  . AMPUTATION Right 05/19/2019   Procedure: RIGHT FOOT 5TH RAY AMPUTATION;  Surgeon: Newt Minion, MD;  Location: [facility];  Service: Orthopedics;  Laterality: Right;  . CARDIAC CATHETERIZATION N/A 09/08/2016   Procedure: Left Heart Cath and Coronary Angiography;  Surgeon: Adrian Prows, MD;  Location: [facility] CV LAB;  Service: Cardiovascular;  Laterality: N/A;  . CATARACT EXTRACTION,  BILATERAL  09/2017, 10/2017   Dr. Herbert Deaner  . COLONOSCOPY W/ BIOPSIES AND POLYPECTOMY    . LOWER EXTREMITY ANGIOGRAM Bilateral 04/18/2012   Procedure: LOWER EXTREMITY ANGIOGRAM;  Surgeon: Angelia Mould, MD;  Location: [facility] CATH LAB;  Service: Cardiovascular;  Laterality: Bilateral;  bilat lower extrem angio  . LOWER EXTREMITY ANGIOGRAPHY Bilateral 05/02/2019   Procedure: LOWER EXTREMITY ANGIOGRAPHY;   Surgeon: Adrian Prows, MD;  Location: [facility] CV LAB;  Service: Cardiovascular;  Laterality: Bilateral;  . LOWER EXTREMITY ANGIOGRAPHY Bilateral 02/28/2019   Procedure: LOWER EXTREMITY ANGIOGRAPHY;  Surgeon: Adrian Prows, MD;  Location: [facility] CV LAB;  Service: Cardiovascular;  Laterality: Bilateral;  . macular photocoagulation     (eye treatments for diabetic retinopathy)-Dr. Zigmund Daniel  . PERIPHERAL VASCULAR INTERVENTION  02/28/2019   Procedure: PERIPHERAL VASCULAR INTERVENTION;  Surgeon: Adrian Prows, MD;  Location: [facility] CV LAB;  Service: Cardiovascular;;       Family History  Problem Relation Age of Onset  . Diabetes Mother   . Hearing loss Mother   . Hypertension Mother   . Hyperlipidemia Mother   . Heart disease Mother   . Varicose Veins Mother   . Varicose Veins Father   . Dementia Father   . Hyperlipidemia Brother   . Diabetes Maternal Grandmother     Social History   Tobacco Use  . Smoking status: Former Smoker    Packs/day: 1.00    Years: 30.00    Pack years: 30.00    Types: Cigarettes    Quit date: 01/08/2012    Years since quitting: 8.7  . Smokeless tobacco: Never Used  Vaping Use  . Vaping Use: Never used  Substance Use Topics  . Alcohol use: Yes    Comment: rare  . Drug use: No    Home Medications Prior to Admission medications   Medication Sig Start Date End Date Taking? Authorizing Provider  B-D UF III MINI PEN NEEDLES 31G X 5 MM MISC U UTD 6 TIMES A DAY 06/29/18   [provider]  Bioflavonoid Products (ESTER-C) 1000-50 MG TABS Take 1 tablet by mouth daily.     [provider]  Cholecalciferol (VITAMIN D) 2000 units CAPS Take 2,000 Units by mouth daily.    [provider]  clopidogrel (PLAVIX) 75 MG tablet Take 1 tablet (75 mg total) by mouth daily. 07/22/20   Adrian Prows, MD  Continuous Blood Gluc Sensor (FREESTYLE LIBRE 14 DAY SENSOR) MISC USE UTD Q 14 DAYS SUBCUTANEOUS Patient not taking: Reported on 06/24/2020 04/14/19    [provider]  furosemide (LASIX) 20 MG tablet Take 1 tablet (20 mg total) by mouth daily. 07/22/20   Adrian Prows, MD  gemfibrozil (LOPID) 600 MG tablet Take 600 mg by mouth 2 (two) times daily before a meal.      [provider]  HUMALOG KWIKPEN 100 UNIT/ML KiwkPen Inject 0-20 Units into the skin 2 (two) times daily. SLIDING SCALE DEPENDING ON BLOOD GLUCOSE BEFORE MEAL 08/02/16   [provider]  HUMULIN N KWIKPEN 100 UNIT/ML Kiwkpen Inject 30-40 Units into the skin See admin instructions. Inject 45 units subcutaneously in the morning & 45units in the evening. 08/03/16   [provider]  isosorbide mononitrate (IMDUR) 60 MG 24 hr tablet Take 1 tablet (60 mg total) by mouth daily. 07/22/20   Adrian Prows, MD  Krill Oil 350 MG CAPS Take 350 mg by mouth daily. [place]    [provider]  lisinopril (  ZESTRIL) 10 MG tablet Take 1 tablet (10 mg total) by mouth daily. 07/22/20   Adrian Prows, MD  metoprolol succinate (TOPROL-XL) 100 MG 24 hr tablet Take 1 tablet (100 mg total) by mouth daily. Take with or immediately following a meal. 07/22/20   Adrian Prows, MD  Multiple Vitamin (MULTIVITAMIN WITH MINERALS) TABS tablet Take 1 tablet by mouth daily.    [provider]  nitroGLYCERIN (NITROSTAT) 0.4 MG SL tablet Place 0.4 mg under the tongue every 5 (five) minutes as needed for chest pain. Patient not taking: Reported on 06/24/2020 08/24/16   [provider]  Probiotic Product (PROBIOTIC-10 PO) Take 1 capsule by mouth daily.    [provider]  rosuvastatin (CRESTOR) 20 MG tablet Take 1 tablet (20 mg total) by mouth daily. 07/22/20   Adrian Prows, MD    Allergies    Food  Review of Systems   Review of Systems  Unable to perform ROS: Severe respiratory distress    Physical Exam Updated Vital Signs BP (!) 123/48   Pulse (!) 112   Temp (!) 97.5 F (36.4 C) (Axillary)   Resp 16   Ht 5\' 10"  (1.778 m)   Wt (!) 144 kg    SpO2 76%   BMI 45.55 kg/m   Physical Exam Vitals and nursing note reviewed.   Morbidly obese 64 year old male, with CPAP mask in place, in severe respiratory distress. Vital signs are significant for elevated heart rate. Oxygen saturation is 76%, which is hypoxic. Head is normocephalic and atraumatic. PERRLA, EOMI. Oropharynx is clear. Neck is nontender and supple without adenopathy or JVD. Back is nontender and there is no CVA tenderness. Lungs have symmetric air movement, but sounds are distant. Chest is nontender. Heart has regular rate and rhythm without murmur. Abdomen is soft, flat, nontender without masses or hepatosplenomegaly and peristalsis is normoactive. Extremities have 1+ edema. Skin is warm and diaphoretic. Neurologic: Somnolent and not responding to verbal commands and not answering questions.  Moves all extremities.  ED Results / Procedures / Treatments   Labs (all labs ordered are listed, but only abnormal results are displayed) Labs Reviewed  BRAIN NATRIURETIC PEPTIDE - Abnormal; Notable for the following components:      Result Value   B Natriuretic Peptide 575.7 (*)    All other components within normal limits  CBC WITH DIFFERENTIAL/PLATELET - Abnormal; Notable for the following components:   WBC 17.1 (*)    RBC 5.90 (*)    Hemoglobin 17.7 (*)    HCT 56.5 (*)    Neutro Abs 10.5 (*)    Lymphs Abs 4.8 (*)    Basophils Absolute 0.2 (*)    Abs Immature Granulocytes 0.09 (*)    All other components within normal limits  BASIC METABOLIC PANEL - Abnormal; Notable for the following components:   Potassium 6.2 (*)    Glucose, Bld 432 (*)    BUN 32 (*)    Creatinine, Ser 1.83 (*)    Calcium 8.8 (*)    GFR, Estimated 40 (*)    All other components within normal limits  TROPONIN I (HIGH SENSITIVITY) - Abnormal; Notable for the following components:   Troponin I (High Sensitivity) 97 (*)    All other components within normal limits  RESP PANEL BY RT-PCR (FLU  A&B, COVID) ARPGX2  MRSA PCR SCREENING  HEMOGLOBIN L9J  BASIC METABOLIC PANEL  I-STAT ARTERIAL BLOOD GAS, ED  TROPONIN I (HIGH SENSITIVITY)    EKG EKG Interpretation  Date/Time:  Friday October 11 2020 04:43:23 EST Ventricular Rate:  120 PR Interval:    QRS Duration: 134 QT Interval:  376 QTC Calculation: 532 R Axis:   -34 Text Interpretation: Sinus or ectopic atrial tachycardia Left bundle branch block When compared with ECG of 02/28/2019, Left bundle branch block is now present [place] has increased Confirmed by Delora Fuel ([id number]) on 10/11/2020 5:01:27 AM   Radiology DG Abdomen 1 View  Result Date: 10/11/2020 CLINICAL DATA:  Nasogastric tube placement EXAM: ABDOMEN - 1 VIEW COMPARISON:  None FINDINGS: Nasogastric tube tip is seen within the left upper quadrant the abdomen within the expected mid body of the stomach. The abdomen is largely obscured. Perihilar pulmonary infiltrate again noted. IMPRESSION: Nasogastric tube within the mid body of the stomach. Electronically Signed   By: Fidela Salisbury MD   On: 10/11/2020 05:35   DG Chest Port 1 View  Result Date: 10/11/2020 CLINICAL DATA:  Respiratory failure EXAM: PORTABLE CHEST 1 VIEW COMPARISON:  None. FINDINGS: Endotracheal tube is seen 6.4 cm above the carina at the level of the clavicular heads. Nasogastric tube extends into the upper abdomen beyond the margin of the examination. The lungs are symmetrically well expanded. No pneumothorax or pleural effusion. There is marked bilateral perihilar interstitial pulmonary infiltrate, most in keeping with marked pulmonary edema, possibly cardiogenic in nature. IMPRESSION: Marked perihilar interstitial pulmonary edema, possibly cardiogenic in nature. Endotracheal tube 6.4 cm above the carina. Electronically Signed   By: Fidela Salisbury MD   On: 10/11/2020 05:33    Procedures Procedure Name: Intubation Date/Time: 10/11/2020 4:50 AM Performed by: Delora Fuel, MD Pre-anesthesia  Checklist: Patient identified, Patient being monitored, Emergency Drugs available, Timeout performed and Suction available Oxygen Delivery Method: Non-rebreather mask Preoxygenation: Pre-oxygenation with 100% oxygen (on CPAP) Induction Type: Rapid sequence Laryngoscope Size: Glidescope and 4 Grade View: Grade I Tube size: 7.5 mm Number of attempts: 1 Airway Equipment and Method: Rigid stylet and Video-laryngoscopy Placement Confirmation: ETT inserted through vocal cords under direct vision,  CO2 detector and Breath sounds checked- equal and bilateral Secured at: 25 cm Tube secured with: ETT holder Dental Injury: Teeth and Oropharynx as per pre-operative assessment        CRITICAL CARE Performed by: Delora Fuel Total critical care time: 80 minutes Critical care time was exclusive of separately billable procedures and treating other patients. Critical care was necessary to treat or prevent imminent or life-threatening deterioration. Critical care was time spent personally by me on the following activities: development of treatment plan with patient and/or surrogate as well as nursing, discussions with consultants, evaluation of patient's response to treatment, examination of patient, obtaining history from patient or surrogate, ordering and performing treatments and interventions, ordering and review of laboratory studies, ordering and review of radiographic studies, pulse oximetry and re-evaluation of patient's condition.   Medications Ordered in ED Medications  etomidate (AMIDATE) 2 MG/ML injection (20 mg Intravenous Given 10/11/20 0452)  succinylcholine (ANECTINE) 200 MG/10ML syringe (150 mg Intravenous Given 10/11/20 0452)  fentaNYL (SUBLIMAZE) injection 100 mcg (100 mcg Intravenous Given 10/11/20 0516)  midazolam (VERSED) injection 5 mg (5 mg Intravenous Given 10/11/20 0517)  heparin injection 4,000 Units (4,000 Units Intravenous Given 10/11/20 0517)  aspirin suppository 300 mg (300 mg  Rectal Given 10/11/20 6045)    ED Course  I have reviewed the triage vital signs and the nursing notes.  Pertinent labs & imaging results that were available during my care of the patient were reviewed by me  and considered in my medical decision making (see chart for details).  MDM Rules/Calculators/A&P Patient arrived in acute respiratory distress, hypoxic in spite of CPAP.  Decision was made to intubate him immediately.  He was intubated using RSI and adequate oxygenation was obtained.  Post intubation chest x-ray showed pulmonary edema, adequate position of endotracheal tube.  ECG showed left bundle branch block which was new compared with 02/28/2019.  Although there was no history of chest pain, new onset pulmonary edema in the setting of new left bundle branch block was felt to represent a STEMI equivalent.  I discussed the case with his cardiologist, Dr. Einar Gip, who agrees with initiation of code STEMI.  He was given heparin and rectal aspirin.  Dr. Einar Gip has come to the ED and taken him to the catheterization lab.  Metabolic panel shows evidence of iacute kidney injury and mild hyperkalemia.   Final Clinical Impression(s) / ED Diagnoses Final diagnoses:  Acute pulmonary edema ([place])  Acute respiratory failure with hypoxemia ([place])  New onset left bundle branch block (LBBB)  Elevated troponin  Acute kidney injury (nontraumatic) (HCC)  Hyperkalemia    Rx / DC Orders ED Discharge Orders    None       Delora Fuel, MD 44/03/47 0730

## 2020-10-11 NOTE — Progress Notes (Signed)
Critical potassium given to Dr. Tamala Julian, [place].

## 2020-10-11 NOTE — Procedures (Signed)
Central Venous Catheter Insertion Procedure Note  DARRYEL DIODATO  [id number]  [birth year]  Date:10/11/20  Time:11:42 AM   Provider Performing:Lesa Vandall Harle Battiest NP  Procedure: Insertion of Non-tunneled Central Venous Catheter(36556) with US guidance ([id number])   Indication(s) Medication administration and Difficult access  Consent Unable to obtain consent due to emergent nature of procedure.  Anesthesia Topical only with 1% lidocaine   Timeout Verified patient identification, verified procedure, site/side was marked, verified correct patient position, special equipment/implants available, medications/allergies/relevant history reviewed, required imaging and test results available.  Sterile Technique Maximal sterile technique including full sterile barrier drape, hand hygiene, sterile gown, sterile gloves, mask, hair covering, sterile ultrasound probe cover (if used).  Procedure Description Area of catheter insertion was cleaned with chlorhexidine and draped in sterile fashion.  With real-time ultrasound guidance a central venous catheter was placed into the right internal jugular vein. Nonpulsatile blood flow and easy flushing noted in all ports.  The catheter was sutured in place and sterile dressing applied.  Complications/Tolerance None; patient tolerated the procedure well. Chest X-ray is ordered to verify placement for internal jugular or subclavian cannulation.   Chest x-ray is not ordered for femoral cannulation.  EBL Minimal  Specimen(s) None  Right IJ CVC was placed with Ultrasound guidance under the direct supervision of Dr. Ina Homes.  Magdalen Spatz, MSN, AGACNP-BC [place] for personal pager PCCM on call pager [phone] 10/11/2020 11:44 AM

## 2020-10-11 NOTE — ED Notes (Signed)
ACTIVATED CODE STEMI PER DR GLICK--Eduardo [name]

## 2020-10-11 NOTE — Consult Note (Addendum)
Advanced Heart Failure Team Consult Note   Primary Physician: Rita Ohara, MD PCP-Cardiologist:  No primary care provider on file.  Reason for Consultation: cardiogenic shock insetting of severe 3V CAD  HPI:    Eduardo [name] is 64 y.o. 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  When EMS arrived found patient in severe respiratory distress with an oxygen saturation on 60% on room and placed on NRBM then CPAP and reporting pain.  EMS administered 1g of Mg2+, and nitro SL x 1.  On arrival to ED, he was on CPAP but unable to answer questions appearing gray. He was intubated at 4:50.  He was found to have new left bundle branch block with pulmonary edema and code STEMI called.  He was taken to cath lab found to have global hypokinesis, EF 15-20%, LVEDP 29 and severe native three-vessel coronary arter disease but no targets for revascularization and transferred to the CTICU.    Eduardo [name] was seen today for evaluation of cardiogenic shock at the request of Dr. Einar Gip.   Patient is currently intubated, requiring NE 8, propofol 40, fentanyl 50 and receiving lokelma for hyperkalemia.     Left heart catheterization 10/11/2020: 1. LV: Severely dilated.  Global hypokinesis.  VEF 15 to 20%.  EDP markedly elevated at 29 mmHg.   2. LAD: Severely diffusely diseased.  Gives origin to large D1 which gives collaterals to the LAD and 2 smaller diagonals, LAD is occluded after the origin of D1.  There are ipsilateral and contralateral collaterals to the LAD.   3. CX: Moderate sized vessel, giving origin to large OM1.  OM1 is occluded in the ostium as was noted previously and has ipsilateral collaterals.  OM1 is diffusely diseased and faintly filled.  4. RCA: Moderate  disease in the proximal and mid segment.  At the bifurcation of PDA and PL, there is a high-grade 90% stenosis.  The bifurcation is also involved with at least a 90% stenosis in the PDA and a 60 to 70% stenosis in the PL branch.  There is no target as distally as the PL branch which is large is occluded distally that was also noted in 2017. There are faint collaterals noted from the left to the RCA.  Coronary angiogram 09/08/2016: 1. Low normal LV systolic function, EF 23-53% with apical akinesis to dyskinesis. 2. Moderate coronary artery calcification, diffuse 3. Mid LAD occluded and collateralized by faint ipsilateral and contralateral collaterals from RCA. Diagonal 1 is moderate sized with ostial 30% stenosis, diagonal 2 is small to moderate sized and bifurcates, proximal segment has 80% stenosis 4. Moderate to circumflex, giving origin to moderate sized OM 1 which bifurcates. OM1 is occluded and has ipsilateral collaterals. 5. Large RCA. Dominant. Proximal 30%. Calcified. Gives origin to 4 PL branches, PL 1 large with ostial 80% stenosis, PL 2 small, PLT 3 large and has a long segment occlusion and collaterals from both left and ipsilateral collaterals. PL 4 is moderate-sized mild disease. 6. Patent LIMA and RIMA.  Review of Systems: [y] = yes, [ ]  = no   . General: Weight gain [ ] ; Weight loss [ ] ; Anorexia [ ] ; Fatigue [ ] ; Fever [ ] ; Chills [ ] ; Weakness [ ]   .  Cardiac: Chest pain/pressure [ ] ; Resting SOB [ ] ; Exertional SOB [ ] ; Orthopnea [ ] ; Pedal Edema [ ] ; Palpitations [ ] ; Syncope [ ] ; Presyncope [ ] ; Paroxysmal nocturnal dyspnea[ ]   . Pulmonary: Cough [ ] ; Wheezing[ ] ; Hemoptysis[ ] ; Sputum [ ] ; Snoring [ ]   . GI: Vomiting[ ] ; Dysphagia[ ] ; Melena[ ] ; Hematochezia [ ] ; Heartburn[ ] ; Abdominal pain [ ] ; Constipation [ ] ; Diarrhea [ ] ; BRBPR [ ]   . GU: Hematuria[ ] ; Dysuria [ ] ; Nocturia[ ]   . Vascular: Pain in legs with walking [ ] ; Pain in feet with lying flat [ ] ; Non-healing sores [  ]; Stroke [ ] ; TIA [ ] ; Slurred speech [ ] ;  . Neuro: Headaches[ ] ; Vertigo[ ] ; Seizures[ ] ; Paresthesias[ ] ;Blurred vision [ ] ; Diplopia [ ] ; Vision changes [ ]   . Ortho/Skin: Arthritis [ ] ; Joint pain [ ] ; Muscle pain [ ] ; Joint swelling [ ] ; Back Pain [ ] ; Rash [ ]   . Psych: Depression[ ] ; Anxiety[ ]   . Heme: Bleeding problems [ ] ; Clotting disorders [ ] ; Anemia [ ]   . Endocrine: Diabetes [ ] ; Thyroid dysfunction[ ]   Home Medications Prior to Admission medications   Medication Sig Start Date End Date Taking? Authorizing Provider  B-D UF III MINI PEN NEEDLES 31G X 5 MM MISC U UTD 6 TIMES A DAY 06/29/18   [provider]  Bioflavonoid Products (ESTER-C) 1000-50 MG TABS Take 1 tablet by mouth daily.     [provider]  Cholecalciferol (VITAMIN D) 2000 units CAPS Take 2,000 Units by mouth daily.    [provider]  clopidogrel (PLAVIX) 75 MG tablet Take 1 tablet (75 mg total) by mouth daily. 07/22/20   Adrian Prows, MD  Continuous Blood Gluc Sensor (FREESTYLE LIBRE 14 DAY SENSOR) MISC USE UTD Q 14 DAYS SUBCUTANEOUS Patient not taking: Reported on 06/24/2020 04/14/19   [provider]  furosemide (LASIX) 20 MG tablet Take 1 tablet (20 mg total) by mouth daily. 07/22/20   Adrian Prows, MD  gemfibrozil (LOPID) 600 MG tablet Take 600 mg by mouth 2 (two) times daily before a meal.      [provider]  HUMALOG KWIKPEN 100 UNIT/ML KiwkPen Inject 0-20 Units into the skin 2 (two) times daily. SLIDING SCALE DEPENDING ON BLOOD GLUCOSE BEFORE MEAL 08/02/16   [provider]  HUMULIN N KWIKPEN 100 UNIT/ML Kiwkpen Inject 30-40 Units into the skin See admin instructions. Inject 45 units subcutaneously in the morning & 45units in the evening. 08/03/16   [provider]  isosorbide mononitrate (IMDUR) 60 MG 24 hr tablet Take 1 tablet (60 mg total) by mouth daily. 07/22/20   Adrian Prows, MD  Krill Oil 350 MG CAPS Take 350 mg by mouth daily. Move Free Ultra Lexicographer, Historical, MD  lisinopril (ZESTRIL) 10 MG tablet Take 1 tablet (10 mg total) by mouth daily. 07/22/20   Adrian Prows, MD  metoprolol succinate (TOPROL-XL) 100 MG 24 hr tablet Take 1 tablet (100 mg total) by mouth daily. Take with or immediately following a meal. 07/22/20   Adrian Prows, MD  Multiple Vitamin (MULTIVITAMIN WITH MINERALS) TABS tablet Take 1 tablet by mouth daily.    [provider]  nitroGLYCERIN (NITROSTAT) 0.4 MG SL tablet Place 0.4 mg under the tongue every 5 (five) minutes as needed for chest pain. Patient not taking: Reported on 06/24/2020 08/24/16   [provider]  Probiotic Product (PROBIOTIC-10 PO) Take 1 capsule by  mouth daily.    [provider]  rosuvastatin (CRESTOR) 20 MG tablet Take 1 tablet (20 mg total) by mouth daily. 07/22/20   Adrian Prows, MD    Past Medical History: Past Medical History:  Diagnosis Date  . Chronic kidney disease    stage 3  . Colon polyp   . Coronary artery disease   . Diabetes mellitus 1987   under care of Dr. Chalmers Cater.  On insulin since 96 (off and on)  . Diabetic retinopathy   . Dupuytren contracture    R hand, s/p injection (Dr. Lenon Curt)  . Essential hypertension, benign   . Essential hypertension, benign 02/06/2019  . Frequency of urination and polyuria   . Hypertension   . Myocardial infarction [facility])    denies  . Neuromuscular disorder ([place])    Diabetic neuropathy  . Osteomyelitis ([place])    right foot  . Other testicular hypofunction   . Peripheral arterial disease ([place]) 10/28/2012  . Peritoneal abscess ([place]) 6/08   and buttock.  . Pneumonia   . Polydipsia   . Proteinuria   . Pure hyperglyceridemia   . Subacute osteomyelitis, right ankle and foot ([place])   . Wears glasses     Past Surgical History: Past Surgical History:  Procedure Laterality Date  . ABDOMINAL AORTAGRAM N/A 04/18/2012   Procedure: ABDOMINAL Maxcine Ham;  Surgeon: Angelia Mould, MD;  Location: [facility] CATH LAB;   Service: Cardiovascular;  Laterality: N/A;  . AMPUTATION Right 05/19/2019   Procedure: RIGHT FOOT 5TH RAY AMPUTATION;  Surgeon: Newt Minion, MD;  Location: [place];  Service: Orthopedics;  Laterality: Right;  . CARDIAC CATHETERIZATION N/A 09/08/2016   Procedure: Left Heart Cath and Coronary Angiography;  Surgeon: Adrian Prows, MD;  Location: [place] CV LAB;  Service: Cardiovascular;  Laterality: N/A;  . CATARACT EXTRACTION, BILATERAL  09/2017, 10/2017   Dr. Herbert Deaner  . COLONOSCOPY W/ BIOPSIES AND POLYPECTOMY    . CORONARY/GRAFT ACUTE MI REVASCULARIZATION N/A 10/11/2020   Procedure: Coronary/Graft Acute MI Revascularization;  Surgeon: Adrian Prows, MD;  Location: [place] CV LAB;  Service: Cardiovascular;  Laterality: N/A;  . LEFT HEART CATH AND CORONARY ANGIOGRAPHY N/A 10/11/2020   Procedure: LEFT HEART CATH AND CORONARY ANGIOGRAPHY;  Surgeon: Adrian Prows, MD;  Location: [place] CV LAB;  Service: Cardiovascular;  Laterality: N/A;  . LOWER EXTREMITY ANGIOGRAM Bilateral 04/18/2012   Procedure: LOWER EXTREMITY ANGIOGRAM;  Surgeon: Angelia Mould, MD;  Location: [facility] CATH LAB;  Service: Cardiovascular;  Laterality: Bilateral;  bilat lower extrem angio  . LOWER EXTREMITY ANGIOGRAPHY Bilateral 05/02/2019   Procedure: LOWER EXTREMITY ANGIOGRAPHY;  Surgeon: Adrian Prows, MD;  Location: [place] CV LAB;  Service: Cardiovascular;  Laterality: Bilateral;  . LOWER EXTREMITY ANGIOGRAPHY Bilateral 02/28/2019   Procedure: LOWER EXTREMITY ANGIOGRAPHY;  Surgeon: Adrian Prows, MD;  Location: [place] CV LAB;  Service: Cardiovascular;  Laterality: Bilateral;  . macular photocoagulation     (eye treatments for diabetic retinopathy)-Dr. Zigmund Waylen Depaolo  . PERIPHERAL VASCULAR INTERVENTION  02/28/2019   Procedure: PERIPHERAL VASCULAR INTERVENTION;  Surgeon: Adrian Prows, MD;  Location: [place] CV LAB;  Service: Cardiovascular;;    Family History: Family History  Problem Relation Age of Onset  . Diabetes Mother     . Hearing loss Mother   . Hypertension Mother   . Hyperlipidemia Mother   . Heart disease Mother   . Varicose Veins Mother   . Varicose Veins Father   . Dementia Father   . Hyperlipidemia Brother   .  Diabetes Maternal Grandmother     Social History: Social History   Socioeconomic History  . Marital status: Widowed    Spouse name: Not on file  . Number of children: 0  . Years of education: Not on file  . Highest education level: Not on file  Occupational History  . 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 Smoking status: Former Smoker    Packs/day: 1.00    Years: 30.00    Pack years: 30.00    Types: Cigarettes    Quit date: 01/08/2012    Years since quitting: 8.7  . Smokeless tobacco: Never Used  Vaping Use  . Vaping Use: Never used  Substance and Sexual Activity  . Alcohol use: Yes    Comment: rare  . Drug use: No  . Sexual activity: Yes    Partners: Female    Birth control/protection: Condom  Other Topics Concern  . Not on file  Social History Narrative   Widowed.    Girlfriends stays with him on weekends   Previously traveled 40 weeks out of the year. No longer travels at all (since COVID none, cut back on travel in 2019). No pets   Working from home.   Social Determinants of Health   Financial Resource Strain:   . Difficulty of Paying Living Expenses: Not on file  Food Insecurity:   . Worried About Charity fundraiser in the Last Year: Not on file  . Ran Out of Food in the Last Year: Not on file  Transportation Needs:   . Lack of Transportation (Medical): Not on file  . Lack of Transportation (Non-Medical): Not on file  Physical Activity:   . Days of Exercise per Week: Not on file  . Minutes of Exercise per Session: Not on file  Stress:   . Feeling of Stress : Not on file  Social Connections:   . Frequency of Communication with Friends and Family: Not on file  . Frequency of Social  Gatherings with Friends and Family: Not on file  . Attends Religious Services: Not on file  . Active Member of Clubs or Organizations: Not on file  . Attends Archivist Meetings: Not on file  . Marital Status: Not on file    Allergies:  Allergies  Allergen Reactions  . Food     Mussels=nausea/vomiting    Objective:    Vital Signs:   Temp:  [97.3 F (36.3 C)-98.8 F (37.1 C)] 97.3 F (36.3 C) (12/03 0800) Pulse Rate:  [78-129] 78 (12/03 0806) Resp:  [15-48] 20 (12/03 0806) BP: (100-212)/(48-185) 129/59 (12/03 0806) SpO2:  [96 %-100 %] 100 % (12/03 0806) FiO2 (%):  [100 %] 100 % (12/03 0806) Weight:  [144 kg] 144 kg (12/03 0444)    Weight change: Filed Weights   10/11/20 0444  Weight: (!) 144 kg    Intake/Output:  No intake or output data in the 24 hours ending 10/11/20 0907    Physical Exam    General:  Intubated HEENT: Pupils small but equal Neck: supple. Ashy/purple color around neck. Carotids 2+ bilat; no bruits. No lymphadenopathy or thyromegaly appreciated. Cor: PMI nondisplaced. Regular rate & rhythm. No rubs, gallops or murmurs. 3 peripheral 20G in RAC and RFA and R hand.  Lungs: clear upper lobes, slightly decreased Abdomen: soft, obese, nontender, nondistended. Hypoactive bowel sounds. OGT. Foley present.  Extremities: Cool feet, cap refill slightly delayed. Right groin  site soft/no swelling.  Neuro: Intubated  Telemetry   SR with rates in the 60s.  Personally reviewed.   EKG    Wide QRS complex.  Personally reviewed.   Labs   Basic Metabolic Panel: Recent Labs  Lab 10/11/20 0555  NA 139  K 6.2*  CL 103  CO2 23  GLUCOSE 432*  BUN 32*  CREATININE 1.83*  CALCIUM 8.8*    Liver Function Tests: No results for input(s): AST, ALT, ALKPHOS, BILITOT, PROT, ALBUMIN in the last 168 hours. No results for input(s): LIPASE, AMYLASE in the last 168 hours. No results for input(s): AMMONIA in the last 168 hours.  CBC: Recent Labs  Lab  10/11/20 0500  WBC 17.1*  NEUTROABS 10.5*  HGB 17.7*  HCT 56.5*  MCV 95.8  PLT 229    Cardiac Enzymes: No results for input(s): CKTOTAL, CKMB, CKMBINDEX, TROPONINI in the last 168 hours.  BNP: BNP (last 3 results) Recent Labs    10/11/20 0500  BNP 575.7*    ProBNP (last 3 results) No results for input(s): PROBNP in the last 8760 hours.   CBG: Recent Labs  Lab 10/11/20 0753  GLUCAP 369*    Coagulation Studies: No results for input(s): LABPROT, INR in the last 72 hours.   Imaging   DG Abdomen 1 View  Result Date: 10/11/2020 CLINICAL DATA:  Nasogastric tube placement EXAM: ABDOMEN - 1 VIEW COMPARISON:  None FINDINGS: Nasogastric tube tip is seen within the left upper quadrant the abdomen within the expected mid body of the stomach. The abdomen is largely obscured. Perihilar pulmonary infiltrate again noted. IMPRESSION: Nasogastric tube within the mid body of the stomach. Electronically Signed   By: Fidela Salisbury MD   On: 10/11/2020 05:35   CARDIAC CATHETERIZATION  Result Date: 10/11/2020 Left Heart Catheterization 10/11/20: LV: Severely dilated.  Global hypokinesis.  Hand contrast injection hence not fully adequately visualized.  LVEF 15 to 20%.  EDP markedly elevated at 29 mmHg.  No pressure gradient across the aortic valve. Left main: Normal. LAD: Severely diffusely diseased.  Gives origin to large D1 which gives collaterals to the LAD and 2 smaller diagonals, LAD is occluded after the origin of D1, anatomy compared to prior in 2017 reveals progression of diffuse disease.  There are ipsilateral and contralateral collaterals to the LAD. CX: Moderate sized vessel, giving origin to large OM1.  OM1 is occluded in the ostium as was noted previously and has ipsilateral collaterals.  OM1 is diffusely diseased and faintly filled. RCA: Moderate disease in the proximal and mid segment.  At the bifurcation of PDA and PL, there is a high-grade 90% stenosis.  The bifurcation is also  involved with at least a 90% stenosis in the PDA and a 60 to 70% stenosis in the PL branch.  There is no target as distally as the PL branch which is large is occluded distally that was also noted in 2017.  There are faint collaterals noted from the left to the RCA. Impression: Severe native vessel three-vessel coronary artery disease with no significant targets for revascularization, LAD may be amenable for revascularization but I am not sure that this would help.  Patient is extremely ill with high risk for mortality.  He also has underlying stage III-IV kidney disease and contrast nephropathy needs to be evaluated further in view of contrast load of 70 mL. I try to reach his friend on the contact list, unable to reach.  DG Chest Port 1 View  Result Date:  10/11/2020 CLINICAL DATA:  Respiratory failure EXAM: PORTABLE CHEST 1 VIEW COMPARISON:  None. FINDINGS: Endotracheal tube is seen 6.4 cm above the carina at the level of the clavicular heads. Nasogastric tube extends into the upper abdomen beyond the margin of the examination. The lungs are symmetrically well expanded. No pneumothorax or pleural effusion. There is marked bilateral perihilar interstitial pulmonary infiltrate, most in keeping with marked pulmonary edema, possibly cardiogenic in nature. IMPRESSION: Marked perihilar interstitial pulmonary edema, possibly cardiogenic in nature. Endotracheal tube 6.4 cm above the carina. Electronically Signed   By: Fidela Salisbury MD   On: 10/11/2020 05:33      Medications:     Current Medications: . aspirin  81 mg Oral Daily  . chlorhexidine gluconate (MEDLINE KIT)  15 mL Mouth Rinse BID  . clopidogrel  75 mg Oral Daily  . fentaNYL (SUBLIMAZE) injection  50 mcg Intravenous Once  . furosemide  40 mg Intravenous Q6H  . heparin  5,000 Units Subcutaneous Q8H  . insulin aspart  0-20 Units Subcutaneous Q4H  . insulin detemir  30 Units Subcutaneous BID  . ivabradine  5 mg Oral BID WC  . mouth rinse  15  mL Mouth Rinse 10 times per day  . metoprolol tartrate  5 mg Intravenous Q6H  . rosuvastatin  20 mg Oral Daily  . sodium chloride flush  3 mL Intravenous Q12H  . sodium zirconium cyclosilicate  10 g Oral TID     Infusions: . sodium chloride    . calcium gluconate 1,000 mg (10/11/20 0814)  . fentaNYL infusion INTRAVENOUS 50 mcg/hr (10/11/20 0756)  . propofol (DIPRIVAN) infusion 40 mcg/kg/min (10/11/20 0843)       Patient Profile   Eduardo [name] is 64 y.o. 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 Code STEMI for new onset of LBBB and pulmonary edema.   Assessment/Plan   1. Acute on chronic heart failure -Echo 07/2016: EF 40%, grade I DD, inferior/lateral hypokinesis -Cath 07/2016: EF 45-50%, mid LAD occluded w/ collaterals, D1 30% & D2 80% stenosis, dominant RCA 30% stenosis, patent LIMA and RIMA. No targets for revascularized and decided on medical management.  -Cath 10/11/2020: global hypokinesis, EF 15-20%, LVEDP 29, severe LAD disease w/ collaterals form D1 and small diagonal branches, CX moderate size w/ OM1 occluded at ostium, RCA mod disease in proximal and mid segment. No targets for revascularization.  -BNP 575 -Hold b-blockers in the setting of shock -Hold ACEI/ARNI in the setting of shock and AKI -On lasix 40 mg IV Q6H and NE 8 --> CVP 11, lactic 1.8, co-ox 75% repeat 72% -Monitor CVP and co-ox  2. STEMI/CAD -EKG showing new LBBB with pulmonary edema taken to cath where was found to have severe 3V disease unable to revascularize -troponin 97 -> 1.7K -continue medical management for now  3. Acute respiratory failure -Intubated 12/3 at 4:50 in ED -Respiratory acidosis on ABG in ICU -Management per CCM  4. AKI on CKD with  hyperkalemia -Presentation Cr 1.83 w/ hyperkalemia -Baseline 1.2-1.4 - 83m CTX in cath -Monitor BMET and UOP -Consider nephro consult  5. DM uncontrolled -hx of DM foot wound, s/p amputation 3 toes -A1c 8.0% (down from 8.8% Aug 2021) -SSI  6. PAD s/p  R SFA occlusion requiring DES  -Successful PTA and stenting of right SFA, prolonged procedure, difficult procedure with use of multiple balloons, guidewires and catheters. 100% stenosis reduced to 0% with implantation of 3 overlapping 6.0 x 120 x2; 6.0 x 100 mm Eluvia DES. -continue to monitor while on pressors  7. Hx of HTN -Hold home meds: imdur 60, lisinopril 10, toprol-xl  100 insetting of cardiogenic shock while on levophed -continue to monitor   8.  Leukocytosis w/ L shift -WBC on arrival 17.1 w/ neutro # 10.5 and afebrile -Respiratory panel negative, CXR shows pulmonary edema -monitor low threshold to start abx  Medication concerns reviewed with patient and pharmacy team. Barriers identified: unable to locate family or friend.   Length of Stay: 0  Carlene Coria, NP  10/11/2020, 9:07 AM  Advanced Heart Failure Team Pager [phone] (M-F; 7a - 4p)  Please contact [facility] for night-coverage after hours (4p -7a ) and weekends on [email or web address]  Agree with above  64 y/o male with DM2, PAD, CAD, icm EF 45%,. CKD 3b, (cr 1.3-1.4) obesity admitted with respiratory failure and new LBBB.   Emergent cath with Dr. Einar Gip showed severe 3v CAD without revascularization options LAD 100%p LCX all marginal occluded RCA diffuse distal 99%   No intubated on NE 8. SBP 110 CVP 12-13. Co-ox 70%   Echo pending. EF 15% on LV gram   General:  Obese mal intubated sedated  HEENT: normal +ETT Neck: supple. JVP jaw Carotids 2+ bilat; no bruits. No lymphadenopathy or thryomegaly appreciated. Cor: PMI nondisplaced. Regular rate & rhythm. No rubs, gallops or murmurs. Lungs: clear Abdomen: obese soft, nontender, nondistended. No  hepatosplenomegaly. No bruits or masses. Good bowel sounds. Extremities: no cyanosis, clubbing, rash, edema Neuro: intubated/sedated  Difficult situation. Cath reviewed. No PCI options at all. Doubt surgical options. Not a transplant candidate. Only meaningful option may be VAD if kidneys recoved and RV ok.   Will check. Echo. Continue NE support. Diurese. Hopefully can extubate soon. Will check for DKA. Also recheck troponin. If rising can start heparin for ACS though no obvious acute culprit on cath.   We will follow.   CRITICAL CARE Performed by: Glori Bickers  Total critical care time: 45 minutes  Critical care time was exclusive of separately billable procedures and treating other patients.  Critical care was necessary to treat or prevent imminent or life-threatening deterioration.  Critical care was time spent personally by me (independent of midlevel providers or residents) on the following activities: development of treatment plan with patient and/or surrogate as well as nursing, discussions with consultants, evaluation of patient's response to treatment, examination of patient, obtaining history from patient or surrogate, ordering and performing treatments and interventions, ordering and review of laboratory studies, ordering and review of radiographic studies, pulse oximetry and re-evaluation of patient's condition.  Glori Bickers, MD  4:18 PM

## 2020-10-11 NOTE — Consult Note (Signed)
NAME:  Eduardo [name], MRN:  [id number], DOB:  [birth year], LOS: 0 ADMISSION DATE:  10/11/2020, CONSULTATION DATE:  10/11/20 REFERRING MD:  Einar Gip, CHIEF COMPLAINT:  SOB   Brief History   64 year old man with metabolic syndrome, CKD, DM2, PVD presenting with SOB found to be in acute pulmonary edema from suspected STEMI emergently intubated in ER.  History of present illness   64 year old man with metabolic syndrome, CKD, DM2, PVD presenting with SOB found to be in acute pulmonary edema from suspected STEMI emergently intubated in ER.  Sent to cath lab for new LBBB, severe 3V disease with no revascularization targets found.  Brought to 2H for further management, PCCM consulted to assist with management.  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  Patient is extremely ill with high risk for mortality.  He also has underlying stage III-IV kidney disease and contrast nephropathy needs to be evaluated further in view of contrast load of 70 mL. I try to reach his friend on the contact list, unable to reach.   Micro Data:  COVID neg MRSA PCR neg  Antimicrobials:  Cefazolin x 1   Interim history/subjective:  Consulted  Objective   Blood pressure 102/66, pulse (!) 55, temperature 99.5 F (37.5 C), resp. rate 20, height 5\' 10"  (1.778 m), weight (!) 144 kg, SpO2 100 %. CVP:  [11 mmHg-17 mmHg] 17 mmHg  Vent Mode: PRVC FiO2 (%):  [60 %-100 %] 60 % Set Rate:  [16 bmp-20 bmp] 20 bmp Vt  Set:  [440 mL-580 mL] 580 mL PEEP:  [10 cmH20] 10 cmH20 Plateau Pressure:  [21 cmH20-25 cmH20] 25 cmH20  No intake or output data in the 24 hours ending 10/11/20 1414 Filed Weights   10/11/20 0444  Weight: (!) 144 kg    Examination: Constitutional: pale chronically ill appearing man in NAD  Eyes: pupils pinpoint, reactive, not tracking Ears, nose, mouth, and throat: ETT with small amount frothy output Cardiovascular: Irregular with PVCs, ext cool to touch Respiratory: Diminished at bases with crackles, passive on vent Gastrointestinal: soft, hypoactive BS Skin: No rashes, normal turgor Neurologic: not withdrawing for me, heavily sedated Psychiatric: cannot assess  K high WBC high Hgb high  Resolved Hospital Problem list   n/a  Assessment & Plan:  Acute hypoxemic respiratory failure secondary to pulmonary edema STEMI vs. 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 + recent dye load Hyperkalemia question ACE-I effect DM2 with severe hyperglycemia Severe PVD Class 3 obesity  - Vent support, VAP prevention bundle - Diuresis, antiplatelets, afterload/preload adjustment, diuresis per CHF team - For K, lokelma/ calcium/ insulin should take care of it - Consider amiodarone for frequent ectopy - Palliative to meet with patient/significant other to discuss GoC - Will follow with you  Best practice:  Diet: hold today Pain/Anxiety/Delirium protocol (if indicated): prop/fentanyl VAP protocol (if indicated): in place DVT prophylaxis: heparin GI prophylaxis: PPI Glucose control: insulin gtt Mobility: BR Code Status: full Family Communication: will update when significant other  comes in Disposition: icu pending pressor/vent liberation   Medical Decision Making    Diagnoses that are immediately life threatening include respiratory failure, shock, hyperkalemia Critical test findings: K 6.1, pulmonary edema on CXR Interventions today to address  these diagnoses are ventilator adjustment, insulin, lokelma Likelihood of life-threatening deterioration without intervention is high.  Labs   CBC: Recent Labs  Lab 10/11/20 0500 10/11/20 0918  WBC 17.1*  --   NEUTROABS 10.5*  --   HGB 17.7* 15.6  HCT 56.5* 46.0  MCV 95.8  --   PLT 229  --     Basic Metabolic Panel: Recent Labs  Lab 10/11/20 0555 10/11/20 0757 10/11/20 0918 10/11/20 1143  NA 139 137 138 136  K 6.2* 6.9* 6.4* 6.1*  CL 103 100  --  102  CO2 23 27  --  22  GLUCOSE 432* 413*  --  468*  BUN 32* 33*  --  36*  CREATININE 1.83* 1.90*  --  2.13*  CALCIUM 8.8* 8.6*  --  8.7*   GFR: Estimated Creatinine Clearance: 49.6 mL/min (A) (by C-G formula based on SCr of 2.13 mg/dL (H)). Recent Labs  Lab 10/11/20 0500 10/11/20 1130  WBC 17.1*  --   LATICACIDVEN  --  1.8    Liver Function Tests: Recent Labs  Lab 10/11/20 1143  AST 124*  ALT 35  ALKPHOS 65  BILITOT 0.8  PROT 6.2*  ALBUMIN 3.4*   No results for input(s): LIPASE, AMYLASE in the last 168 hours. No results for input(s): AMMONIA in the last 168 hours.  ABG    Component Value Date/Time   PHART 7.267 (L) 10/11/2020 0918   PCO2ART 54.1 (H) 10/11/2020 0918   PO2ART 105 10/11/2020 0918   HCO3 24.7 10/11/2020 0918   TCO2 26 10/11/2020 0918   ACIDBASEDEF 3.0 (H) 10/11/2020 0918   O2SAT 74.5 10/11/2020 1210     Coagulation Profile: No results for input(s): INR, PROTIME in the last 168 hours.  Cardiac Enzymes: No results for input(s): CKTOTAL, CKMB, CKMBINDEX, TROPONINI in the last 168 hours.  HbA1C: Hemoglobin A1C  Date/Time Value Ref Range Status  07/08/2020 12:00 AM 8.8  Final  04/14/2019 12:00 AM 7.2  Final   Hgb A1c MFr Bld  Date/Time Value Ref Range Status  10/11/2020 07:57 AM 8.0 (H) 4.8 - 5.6 % Final    Comment:    (NOTE) Pre diabetes:          5.7%-6.4%  Diabetes:              >6.4%  Glycemic control for   <7.0% adults with diabetes     CBG: Recent Labs  Lab  10/11/20 0753 10/11/20 1202  GLUCAP 369* 387*    Review of Systems:   Cannot assess, comatose  Past Medical History  He,  has a past medical history of Chronic kidney disease, Colon polyp, Coronary artery disease, Diabetes mellitus (1987), Diabetic retinopathy, Dupuytren contracture, Essential hypertension, benign, Essential hypertension, benign (02/06/2019), Frequency of urination and polyuria, Hypertension, Myocardial infarction [facility]), Neuromuscular disorder ([place]), Osteomyelitis ([place]), Other testicular hypofunction, Peripheral arterial disease ([place]) (10/28/2012), Peritoneal abscess ([place]) (6/08), Pneumonia, Polydipsia, Proteinuria, Pure hyperglyceridemia, Subacute osteomyelitis, right ankle and foot ([place]), and Wears glasses.   Surgical History    Past Surgical History:  Procedure Laterality Date  . ABDOMINAL AORTAGRAM N/A 04/18/2012   Procedure: ABDOMINAL Maxcine Ham;  Surgeon: Angelia Mould, MD;  Location: [facility] CATH LAB;  Service: Cardiovascular;  Laterality: N/A;  . AMPUTATION Right  05/19/2019   Procedure: RIGHT FOOT 5TH RAY AMPUTATION;  Surgeon: Newt Minion, MD;  Location: [place];  Service: Orthopedics;  Laterality: Right;  . CARDIAC CATHETERIZATION N/A 09/08/2016   Procedure: Left Heart Cath and Coronary Angiography;  Surgeon: Adrian Prows, MD;  Location: [place] CV LAB;  Service: Cardiovascular;  Laterality: N/A;  . CATARACT EXTRACTION, BILATERAL  09/2017, 10/2017   Dr. Herbert Deaner  . COLONOSCOPY W/ BIOPSIES AND POLYPECTOMY    . CORONARY/GRAFT ACUTE MI REVASCULARIZATION N/A 10/11/2020   Procedure: Coronary/Graft Acute MI Revascularization;  Surgeon: Adrian Prows, MD;  Location: [place] CV LAB;  Service: Cardiovascular;  Laterality: N/A;  . LEFT HEART CATH AND CORONARY ANGIOGRAPHY N/A 10/11/2020   Procedure: LEFT HEART CATH AND CORONARY ANGIOGRAPHY;  Surgeon: Adrian Prows, MD;  Location: [place] CV LAB;  Service: Cardiovascular;  Laterality: N/A;  . LOWER EXTREMITY ANGIOGRAM  Bilateral 04/18/2012   Procedure: LOWER EXTREMITY ANGIOGRAM;  Surgeon: Angelia Mould, MD;  Location: [facility] CATH LAB;  Service: Cardiovascular;  Laterality: Bilateral;  bilat lower extrem angio  . LOWER EXTREMITY ANGIOGRAPHY Bilateral 05/02/2019   Procedure: LOWER EXTREMITY ANGIOGRAPHY;  Surgeon: Adrian Prows, MD;  Location: [place] CV LAB;  Service: Cardiovascular;  Laterality: Bilateral;  . LOWER EXTREMITY ANGIOGRAPHY Bilateral 02/28/2019   Procedure: LOWER EXTREMITY ANGIOGRAPHY;  Surgeon: Adrian Prows, MD;  Location: [place] CV LAB;  Service: Cardiovascular;  Laterality: Bilateral;  . macular photocoagulation     (eye treatments for diabetic retinopathy)-Dr. Zigmund Baylon Santelli  . PERIPHERAL VASCULAR INTERVENTION  02/28/2019   Procedure: PERIPHERAL VASCULAR INTERVENTION;  Surgeon: Adrian Prows, MD;  Location: [place] CV LAB;  Service: Cardiovascular;;     Social History   reports that he quit smoking about 8 years ago. His smoking use included cigarettes. He has a 30.00 pack-year smoking history. He has never used smokeless tobacco. He reports current alcohol use. He reports that he does not use drugs.   Family History   His family history includes Dementia in his father; Diabetes in his maternal grandmother and mother; Hearing loss in his mother; Heart disease in his mother; Hyperlipidemia in his brother and mother; Hypertension in his mother; Varicose Veins in his father and mother.   Allergies Allergies  Allergen Reactions  . Food     Mussels=nausea/vomiting     Home Medications  Prior to Admission medications   Medication Sig Start Date End Date Taking? Authorizing Provider  B-D UF III MINI PEN NEEDLES 31G X 5 MM MISC U UTD 6 TIMES A DAY 06/29/18   [provider]  Bioflavonoid Products (ESTER-C) 1000-50 MG TABS Take 1 tablet by mouth daily.     [provider]  Cholecalciferol (VITAMIN D) 2000 units CAPS Take 2,000 Units by mouth daily.    [provider]    clopidogrel (PLAVIX) 75 MG tablet Take 1 tablet (75 mg total) by mouth daily. 07/22/20   Adrian Prows, MD  Continuous Blood Gluc Sensor (FREESTYLE LIBRE 14 DAY SENSOR) MISC USE UTD Q 14 DAYS SUBCUTANEOUS Patient not taking: Reported on 06/24/2020 04/14/19   [provider]  furosemide (LASIX) 20 MG tablet Take 1 tablet (20 mg total) by mouth daily. 07/22/20   Adrian Prows, MD  gemfibrozil (LOPID) 600 MG tablet Take 600 mg by mouth 2 (two) times daily before a meal.      [provider]  HUMALOG KWIKPEN 100 UNIT/ML KiwkPen Inject 0-20 Units into the skin 2 (two) times daily. SLIDING SCALE DEPENDING ON BLOOD GLUCOSE  BEFORE MEAL 08/02/16   [provider]  HUMULIN N KWIKPEN 100 UNIT/ML Kiwkpen Inject 30-40 Units into the skin See admin instructions. Inject 45 units subcutaneously in the morning & 45units in the evening. 08/03/16   [provider]  isosorbide mononitrate (IMDUR) 60 MG 24 hr tablet Take 1 tablet (60 mg total) by mouth daily. 07/22/20   Adrian Prows, MD  Krill Oil 350 MG CAPS Take 350 mg by mouth daily. [place] Oil    [provider]  lisinopril (ZESTRIL) 10 MG tablet Take 1 tablet (10 mg total) by mouth daily. 07/22/20   Adrian Prows, MD  metoprolol succinate (TOPROL-XL) 100 MG 24 hr tablet Take 1 tablet (100 mg total) by mouth daily. Take with or immediately following a meal. 07/22/20   Adrian Prows, MD  Multiple Vitamin (MULTIVITAMIN WITH MINERALS) TABS tablet Take 1 tablet by mouth daily.    [provider]  nitroGLYCERIN (NITROSTAT) 0.4 MG SL tablet Place 0.4 mg under the tongue every 5 (five) minutes as needed for chest pain. Patient not taking: Reported on 06/24/2020 08/24/16   [provider]  Probiotic Product (PROBIOTIC-10 PO) Take 1 capsule by mouth daily.    [provider]  rosuvastatin (CRESTOR) 20 MG tablet Take 1 tablet (20 mg total) by mouth daily. 07/22/20   Adrian Prows, MD     Critical care time: 42  minutes

## 2020-10-11 NOTE — ED Triage Notes (Signed)
Pt from home by EMS; called 911 report SOB; EMS report pt was gray and purple on their arrival.  EMS gave 1 gm Mag and one Nitro SL. Pt on CPAP on arrival; ER physician to bedside on arrival.  Pt to be intubated.

## 2020-10-11 NOTE — Procedures (Signed)
Arterial Catheter Insertion Procedure Note  Eduardo [name]  [id number]  1956/05/08  Date:10/11/20  Time:8:03 PM    Provider Performing: Claudell Kyle C    Procedure: Insertion of Arterial Line [phone]) without US guidance doppler used    Indication(s) Blood pressure monitoring and/or need for frequent ABGs  Consent Unable to obtain consent due to emergent nature of procedure.  Anesthesia None   Time Out Verified patient identification, verified procedure, site/side was marked, verified correct patient position, special equipment/implants available, medications/allergies/relevant history reviewed, required imaging and test results available.   Sterile Technique Maximal sterile technique including full sterile barrier drape, hand hygiene, sterile gown, sterile gloves, mask, hair covering, sterile ultrasound probe cover (if used).   Procedure Description Area of catheter insertion was cleaned with chlorhexidine and draped in sterile fashion. With real-time ultrasound guidance an arterial catheter was placed into the left radial artery.  Appropriate arterial tracings confirmed on monitor.     Complications/Tolerance None; patient tolerated the procedure well.   EBL Minimal   Specimen(s) None

## 2020-10-11 NOTE — Progress Notes (Signed)
ANTICOAGULATION CONSULT NOTE - Initial Consult  Pharmacy Consult for IV Heparin Indication: chest pain/ACS  Allergies  Allergen Reactions   Food Nausea And Vomiting    Mussels cause nausea/vomiting    Patient Measurements: Height: 5\' 10"  (177.8 cm) Weight: (!) 144 kg (317 lb 7.4 oz) IBW/kg (Calculated) : 73 Heparin Dosing Weight: 107 kg  Vital Signs: Temp: 100.4 F (38 C) (12/03 1800) Temp Source: Skin (12/03 0800) BP: 111/53 (12/03 1800) Pulse Rate: 70 (12/03 1800)  Labs: Recent Labs    10/11/20 0500 10/11/20 0555 10/11/20 0757 10/11/20 0918 10/11/20 1143 10/11/20 1424 10/11/20 1559  HGB 17.7*  --   --  15.6  --   --   --   HCT 56.5*  --   --  46.0  --   --   --   PLT 229  --   --   --   --   --   --   CREATININE  --    < > 1.90*  --  2.13* 2.21*  --   TROPONINIHS 97*  --  1,693*  --   --   --  >27,000*   < > = values in this interval not displayed.    Estimated Creatinine Clearance: 47.8 mL/min (A) (by C-G formula based on SCr of 2.21 mg/dL (H)).   Medical History: Past Medical History:  Diagnosis Date   Chronic kidney disease    stage 3   Colon polyp    Coronary artery disease    Diabetes mellitus 1987   under care of Dr. Chalmers Cater.  On insulin since 96 (off and on)   Diabetic retinopathy    Dupuytren contracture    R hand, s/p injection (Dr. Lenon Curt)   Essential hypertension, benign    Essential hypertension, benign 02/06/2019   Frequency of urination and polyuria    Hypertension    Myocardial infarction [facility])    denies   Neuromuscular disorder ([place])    Diabetic neuropathy   Osteomyelitis ([place])    right foot   Other testicular hypofunction    Peripheral arterial disease ([place]) 10/28/2012   Peritoneal abscess ([place]) 6/08   and buttock.   Pneumonia    Polydipsia    Proteinuria    Pure hyperglyceridemia    Subacute osteomyelitis, right ankle and foot ([facility])    Wears glasses     Assessment: 64 years of age male with  significant cardiac history including three vessel disease but no targets for CABG admitted due to breathing difficulty and concern for pulmonary edema and code STEMI called.  Patient s/p L-heart cath this AM showing acute on chronic heart failure and severe LAD disease with collaterals OM1 occlusion, and RCA with moderate disease. No targets for revascularization.   Patient's last high sensitivity troponin >27,0000. Pharmacy consulted to start IV heparin therapy for ACS.   Goal of Therapy:  Heparin level 0.3-0.7 units/ml Monitor platelets by anticoagulation protocol: Yes   Plan:  Start IV Heparin at 1300 units/hr.  No bolus with recent cath.  Heparin level in 6 hours Daily heparin level and CBC while on therapy.   Sloan Leiter, PharmD, BCPS, BCCCP Clinical Pharmacist Please refer to [facility] for [place] numbers 10/11/2020,7:06 PM

## 2020-10-11 NOTE — Progress Notes (Signed)
°   10/11/20 0600  Clinical Encounter Type  Visited With Patient not available  Visit Type Code  Referral From Nurse  Consult/Referral To Chaplain  The chaplain responded to code stemi. The patient is headed to the CATH Lab. No family is present at this time. No services needed at this time. The chaplain will follow up as needed.

## 2020-10-11 NOTE — Progress Notes (Signed)
Inpatient Diabetes Program Recommendations  AACE/ADA: New Consensus Statement on Inpatient Glycemic Control (2015)  Target Ranges:  Prepandial:   less than 140 mg/dL      Peak postprandial:   less than 180 mg/dL (1-2 hours)      Critically ill patients:  140 - 180 mg/dL   Lab Results  Component Value Date   GLUCAP 369 (H) 10/11/2020   HGBA1C 8.0 (H) 10/11/2020    Review of Glycemic Control Results for Eduardo [name], Eduardo [name]" (MRN [id number]) as of 10/11/2020 10:33  Ref. Range 10/11/2020 07:53  Glucose-Capillary Latest Ref Range: 70 - 99 mg/dL 369 (H)   Diabetes history: DM 2 Outpatient Diabetes medications:  Humalog 0-20 units bid, Humulin N 45 bid Current orders for Inpatient glycemic control:  Levemir 30 units bid, Novolog resistant q 4 hours  Inpatient Diabetes Program Recommendations:   If blood sugars continue to be > 200 mg/dL, consider adding IV insulin/ICU protocol.    Thanks,  Adah Perl, RN, BC-ADM Inpatient Diabetes Coordinator Pager [phone] (8a-5p)

## 2020-10-12 ENCOUNTER — Inpatient Hospital Stay (HOSPITAL_COMMUNITY): Payer: Medicare Other

## 2020-10-12 ENCOUNTER — Encounter (HOSPITAL_COMMUNITY): Payer: Self-pay | Admitting: Cardiology

## 2020-10-12 DIAGNOSIS — J9601 Acute respiratory failure with hypoxia: Secondary | ICD-10-CM

## 2020-10-12 DIAGNOSIS — N179 Acute kidney failure, unspecified: Secondary | ICD-10-CM | POA: Diagnosis not present

## 2020-10-12 DIAGNOSIS — E875 Hyperkalemia: Secondary | ICD-10-CM | POA: Diagnosis not present

## 2020-10-12 DIAGNOSIS — I5023 Acute on chronic systolic (congestive) heart failure: Secondary | ICD-10-CM | POA: Diagnosis not present

## 2020-10-12 DIAGNOSIS — I214 Non-ST elevation (NSTEMI) myocardial infarction: Secondary | ICD-10-CM

## 2020-10-12 DIAGNOSIS — Z515 Encounter for palliative care: Secondary | ICD-10-CM

## 2020-10-12 DIAGNOSIS — N189 Chronic kidney disease, unspecified: Secondary | ICD-10-CM | POA: Diagnosis not present

## 2020-10-12 DIAGNOSIS — R570 Cardiogenic shock: Secondary | ICD-10-CM

## 2020-10-12 LAB — CBC
HCT: 49 % (ref 39.0–52.0)
Hemoglobin: 16 g/dL (ref 13.0–17.0)
MCH: 30.1 pg (ref 26.0–34.0)
MCHC: 32.7 g/dL (ref 30.0–36.0)
MCV: 92.1 fL (ref 80.0–100.0)
Platelets: 176 10*3/uL (ref 150–400)
RBC: 5.32 MIL/uL (ref 4.22–5.81)
RDW: 13.3 % (ref 11.5–15.5)
WBC: 16.3 10*3/uL — ABNORMAL HIGH (ref 4.0–10.5)
nRBC: 0 % (ref 0.0–0.2)

## 2020-10-12 LAB — ECHOCARDIOGRAM COMPLETE
Area-P 1/2: 5.66 cm2
Height: 70 in
Weight: 5142.89 oz

## 2020-10-12 LAB — GLUCOSE, CAPILLARY
Glucose-Capillary: 128 mg/dL — ABNORMAL HIGH (ref 70–99)
Glucose-Capillary: 142 mg/dL — ABNORMAL HIGH (ref 70–99)
Glucose-Capillary: 175 mg/dL — ABNORMAL HIGH (ref 70–99)
Glucose-Capillary: 182 mg/dL — ABNORMAL HIGH (ref 70–99)
Glucose-Capillary: 186 mg/dL — ABNORMAL HIGH (ref 70–99)
Glucose-Capillary: 199 mg/dL — ABNORMAL HIGH (ref 70–99)
Glucose-Capillary: 212 mg/dL — ABNORMAL HIGH (ref 70–99)
Glucose-Capillary: 216 mg/dL — ABNORMAL HIGH (ref 70–99)
Glucose-Capillary: 236 mg/dL — ABNORMAL HIGH (ref 70–99)
Glucose-Capillary: 285 mg/dL — ABNORMAL HIGH (ref 70–99)
Glucose-Capillary: 345 mg/dL — ABNORMAL HIGH (ref 70–99)
Glucose-Capillary: 375 mg/dL — ABNORMAL HIGH (ref 70–99)
Glucose-Capillary: 430 mg/dL — ABNORMAL HIGH (ref 70–99)
Glucose-Capillary: 94 mg/dL (ref 70–99)
Glucose-Capillary: 157 mg/dL — ABNORMAL HIGH (ref 70–99)
Glucose-Capillary: 140 mg/dL — ABNORMAL HIGH (ref 70–99)
Glucose-Capillary: 202 mg/dL — ABNORMAL HIGH (ref 70–99)
Glucose-Capillary: 202 mg/dL — ABNORMAL HIGH (ref 70–99)
Glucose-Capillary: 218 mg/dL — ABNORMAL HIGH (ref 70–99)
Glucose-Capillary: 198 mg/dL — ABNORMAL HIGH (ref 70–99)
Glucose-Capillary: 176 mg/dL — ABNORMAL HIGH (ref 70–99)
Glucose-Capillary: 213 mg/dL — ABNORMAL HIGH (ref 70–99)
Glucose-Capillary: 209 mg/dL — ABNORMAL HIGH (ref 70–99)
Glucose-Capillary: 186 mg/dL — ABNORMAL HIGH (ref 70–99)
Glucose-Capillary: 207 mg/dL — ABNORMAL HIGH (ref 70–99)
Glucose-Capillary: 201 mg/dL — ABNORMAL HIGH (ref 70–99)
Glucose-Capillary: 270 mg/dL — ABNORMAL HIGH (ref 70–99)
Glucose-Capillary: 185 mg/dL — ABNORMAL HIGH (ref 70–99)

## 2020-10-12 LAB — BASIC METABOLIC PANEL
Anion gap: 14 (ref 5–15)
Anion gap: 12 (ref 5–15)
BUN: 43 mg/dL — ABNORMAL HIGH (ref 8–23)
BUN: 43 mg/dL — ABNORMAL HIGH (ref 8–23)
CO2: 20 mmol/L — ABNORMAL LOW (ref 22–32)
CO2: 22 mmol/L (ref 22–32)
Calcium: 8.6 mg/dL — ABNORMAL LOW (ref 8.9–10.3)
Calcium: 8.4 mg/dL — ABNORMAL LOW (ref 8.9–10.3)
Chloride: 106 mmol/L (ref 98–111)
Chloride: 107 mmol/L (ref 98–111)
Creatinine, Ser: 2.26 mg/dL — ABNORMAL HIGH (ref 0.61–1.24)
Creatinine, Ser: 2.21 mg/dL — ABNORMAL HIGH (ref 0.61–1.24)
GFR, Estimated: 31 mL/min — ABNORMAL LOW (ref >60)
GFR, Estimated: 32 mL/min — ABNORMAL LOW (ref >60)
Glucose, Bld: 216 mg/dL — ABNORMAL HIGH (ref 70–99)
Glucose, Bld: 219 mg/dL — ABNORMAL HIGH (ref 70–99)
Potassium: 3.7 mmol/L (ref 3.5–5.1)
Potassium: 3.4 mmol/L — ABNORMAL LOW (ref 3.5–5.1)
Sodium: 140 mmol/L (ref 135–145)
Sodium: 141 mmol/L (ref 135–145)

## 2020-10-12 LAB — HEPARIN LEVEL (UNFRACTIONATED)
Heparin Unfractionated: 0.17 IU/mL — ABNORMAL LOW (ref 0.30–0.70)
Heparin Unfractionated: 0.23 IU/mL — ABNORMAL LOW (ref 0.30–0.70)
Heparin Unfractionated: 0.37 IU/mL (ref 0.30–0.70)

## 2020-10-12 LAB — PHOSPHORUS: Phosphorus: 3.6 mg/dL (ref 2.5–4.6)

## 2020-10-12 LAB — COOXEMETRY PANEL
Carboxyhemoglobin: 0.7 % (ref 0.5–1.5)
Methemoglobin: 1.2 % (ref 0.0–1.5)
O2 Saturation: 83.1 %
Total hemoglobin: 13.8 g/dL (ref 12.0–16.0)

## 2020-10-12 LAB — TRIGLYCERIDES: Triglycerides: 144 mg/dL (ref <150)

## 2020-10-12 LAB — MAGNESIUM: Magnesium: 2.2 mg/dL (ref 1.7–2.4)

## 2020-10-12 MED ORDER — SODIUM CHLORIDE 0.9% FLUSH
10.0000 mL | Freq: Two times a day (BID) | INTRAVENOUS | Status: DC
  Administered 2020-10-12 – 2020-10-13 (×3): 10 mL

## 2020-10-12 MED ORDER — ASPIRIN 81 MG PO CHEW
81.0000 mg | CHEWABLE_TABLET | Freq: Every day | ORAL | Status: DC
  Administered 2020-10-13 – 2020-10-15 (×3): 81 mg via ORAL
  Filled 2020-10-12 (×3): qty 1

## 2020-10-12 MED ORDER — PERFLUTREN LIPID MICROSPHERE
INTRAVENOUS | Status: AC
  Filled 2020-10-12: qty 10

## 2020-10-12 MED ORDER — INSULIN ASPART 100 UNIT/ML ~~LOC~~ SOLN: 4.0000 [IU] | Freq: Three times a day (TID) | SUBCUTANEOUS | Status: DC

## 2020-10-12 MED ORDER — PERFLUTREN LIPID MICROSPHERE
1.0000 mL | INTRAVENOUS | Status: AC | PRN
  Filled 2020-10-12: qty 10

## 2020-10-12 MED ORDER — CLOPIDOGREL BISULFATE 75 MG PO TABS
75.0000 mg | ORAL_TABLET | Freq: Every day | ORAL | Status: DC
  Administered 2020-10-13 – 2020-10-15 (×3): 75 mg via ORAL
  Filled 2020-10-12 (×3): qty 1

## 2020-10-12 MED ORDER — INSULIN DETEMIR 100 UNIT/ML ~~LOC~~ SOLN
35.0000 [IU] | Freq: Two times a day (BID) | SUBCUTANEOUS | Status: DC
  Administered 2020-10-12 – 2020-10-15 (×6): 35 [IU] via SUBCUTANEOUS
  Filled 2020-10-12 (×8): qty 0.35

## 2020-10-12 MED ORDER — SODIUM CHLORIDE 0.9% FLUSH: 10.0000 mL | INTRAVENOUS | Status: DC | PRN

## 2020-10-12 MED ORDER — INSULIN ASPART 100 UNIT/ML ~~LOC~~ SOLN
0.0000 [IU] | SUBCUTANEOUS | Status: DC
  Administered 2020-10-13: 5 [IU] via SUBCUTANEOUS
  Administered 2020-10-13 (×2): 2 [IU] via SUBCUTANEOUS
  Administered 2020-10-13: 3 [IU] via SUBCUTANEOUS
  Administered 2020-10-13: 2 [IU] via SUBCUTANEOUS
  Administered 2020-10-13 – 2020-10-14 (×2): 5 [IU] via SUBCUTANEOUS
  Administered 2020-10-14: 1 [IU] via SUBCUTANEOUS
  Administered 2020-10-14: 7 [IU] via SUBCUTANEOUS
  Administered 2020-10-14: 3 [IU] via SUBCUTANEOUS
  Administered 2020-10-14: 2 [IU] via SUBCUTANEOUS

## 2020-10-12 MED ORDER — PNEUMOCOCCAL VAC POLYVALENT 25 MCG/0.5ML IJ INJ: 0.5000 mL | INJECTION | INTRAMUSCULAR | Status: DC | PRN

## 2020-10-12 MED ORDER — VITAL HIGH PROTEIN PO LIQD: 1000.0000 mL | ORAL | Status: DC

## 2020-10-12 MED ORDER — POTASSIUM CHLORIDE CRYS ER 20 MEQ PO TBCR
40.0000 meq | EXTENDED_RELEASE_TABLET | Freq: Once | ORAL | Status: AC
  Administered 2020-10-12: 40 meq via ORAL
  Filled 2020-10-12: qty 2

## 2020-10-12 MED ORDER — ACETAMINOPHEN 325 MG PO TABS
650.0000 mg | ORAL_TABLET | ORAL | Status: DC | PRN
  Administered 2020-10-12 (×2): 650 mg via ORAL
  Filled 2020-10-12 (×2): qty 2

## 2020-10-12 MED ORDER — CHLORHEXIDINE GLUCONATE 0.12 % MT SOLN
15.0000 mL | Freq: Two times a day (BID) | OROMUCOSAL | Status: DC
  Administered 2020-10-13 – 2020-10-15 (×4): 15 mL via OROMUCOSAL
  Filled 2020-10-12 (×5): qty 15

## 2020-10-12 MED ORDER — ORAL CARE MOUTH RINSE
15.0000 mL | Freq: Two times a day (BID) | OROMUCOSAL | Status: DC
  Administered 2020-10-12 – 2020-10-14 (×4): 15 mL via OROMUCOSAL

## 2020-10-12 NOTE — Progress Notes (Signed)
NAME:  Eduardo [name], MRN:  [id number], DOB:  1956-08-27, LOS: 1 ADMISSION DATE:  10/11/2020, CONSULTATION DATE:  10/11/20 REFERRING MD:  Einar Gip, CHIEF COMPLAINT:  SOB   Brief History   64 year old man with metabolic syndrome, CKD, DM2, PVD presenting with SOB found to be in acute pulmonary edema from suspected STEMI emergently intubated in ER.  History of present illness   64 year old man with metabolic syndrome, CKD, DM2, PVD presenting with SOB found to be in acute pulmonary edema from suspected STEMI emergently intubated in ER.  Sent to cath lab for new LBBB, severe 3V disease with no revascularization targets found.  Brought to 2H for further management, PCCM consulted to assist with management.  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  Patient is extremely ill with high risk for mortality.  He also has underlying stage III-IV kidney disease and contrast nephropathy needs to be evaluated further in view of contrast load of 70 mL. I try to reach his friend on the contact list, unable to reach.   Micro Data:  COVID neg MRSA PCR neg  Antimicrobials:  Cefazolin x 1   Interim history/subjective:  Remains stable on vent, pressors, and sedation. Low grade fevers this AM.  Objective   Blood pressure 136/80, pulse 64, temperature (!) 100.8 F (38.2 C), resp. rate 20, height 5\' 10"  (1.778 m), weight (!) 145.8 kg, SpO2 99 %. CVP:  [4 mmHg-41 mmHg] 19 mmHg  Vent Mode:  PRVC FiO2 (%):  [50 %-60 %] 50 % Set Rate:  [20 bmp] 20 bmp Vt Set:  [580 mL] 580 mL PEEP:  [10 cmH20] 10 cmH20 Plateau Pressure:  [22 cmH20-25 cmH20] 24 cmH20   Intake/Output Summary (Last 24 hours) at 10/12/2020 0825 Last data filed at 10/12/2020 0800 Gross per 24 hour  Intake 2141.83 ml  Output 1815 ml  Net 326.83 ml   Filed Weights   10/11/20 0444 10/12/20 0615  Weight: (!) 144 kg (!) 145.8 kg    Examination: Constitutional: pale ill appearing man on vent  Eyes: Pupils pinpoint, not tracking Ears, nose, mouth, and throat: ETT in place, minimal secretions, trachea midline Cardiovascular: regular with frequent ectopy, ext cool to touch Respiratory: Diminished at bases, no accessory muscle use Gastrointestinal: soft, hypoactive BS Skin: pale, some mottling Neurologic: pending SAT Psychiatric: RASS -5   K improved Cr stable Trops > 27k, echo pending CXR ongoing pulmonary edema  Resolved Hospital Problem list   n/a  Assessment & Plan:  -Acute hypoxemic respiratory failure secondary to pulmonary edema -STEMI- confirmed with trop leak, echo pending, ext are cool, co-ox intact -Low grade fevers -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 + recent dye load, stable -Hyperkalemia question ACE-I effect- improved with insulin +  -DM2 with severe hyperglycemia improved on insulin gtt -Severe PVD -Class 3 obesity  - Vent support, VAP prevention bundle, work on weaning once we get pulmonary edema under better control - Continue to hold abx, trend WBC/fever curve - Diuresis, antiplatelets, AC, afterload/preload adjustment, diuresis, advanced mechanical  options per CHF team - DC lokelma - Continue amiodarone, keep electrolytes WNL - Palliative to meet with patient/significant other to discuss GoC - Will follow with you  Best practice:  Diet: start TF Pain/Anxiety/Delirium protocol (if indicated): prop/fentanyl VAP protocol (if indicated): in place DVT prophylaxis: heparin GI prophylaxis:  PPI Glucose control: insulin gtt for another day with TF to get idea of how much he will need transitioning Mobility: BR Code Status: full Family Communication: updated partner/POA at bedside 10/11/20, there is also apparently a sister, will need to clarify decision rights Disposition: icu pending pressor/vent liberation   Medical Decision Making    Diagnoses that are immediately life threatening include respiratory failure, shock Critical test findings: pulmonary edema on CXR Interventions today to address these diagnoses are ventilator adjustment, insulin Likelihood of life-threatening deterioration without intervention is high.   I personally spent 35 minutes providing critical care not including any separately billable procedures  Erskine Emery MD [place] [facility] 10/12/2020 8:38 AM Personal pager: [phone] If unanswered, please page CCM On-call: [phone]

## 2020-10-12 NOTE — Consult Note (Signed)
Consultation Note Date: 10/12/2020   Patient Name: Eduardo [name]  DOB: 1955/11/16  MRN: [id number]  Age / Sex: 64 y.o., male  PCP: Rita Ohara, MD Referring Physician: Adrian Prows, MD  Reason for Consultation: Establishing goals of care  HPI/Patient Profile: 64 y.o. male "Eduardo [name]" with past medical history of type 2 diabetes with metabolic syndrome, peripheral vascular disease, chronic kidney disease, who was admitted on 10/11/2020 with shortness of breath.  He was found to have pulmonary edema and an HS troponin of 27,000.  He was unable to successfully ventilate on his own and was intubated in the emergency department.  Code STEMI was called and he was immediately taken to the Cath Lab.  He was found to have severe three-vessel disease.  Initial thought was there were no options for revascularization.  EF is 15 to 20%.  Currently (12/4) he is in cardiac ICU on high-dose pressors and propofol.  He remains intubated.  Clinical Assessment and Goals of Care:  I have reviewed medical records including EPIC notes, labs and imaging, received report from Dr. Haroldine Laws and ICU RN, examined the patient and spoke on the phone with Rod Holler and Kerry Dory who are the patient's healthcare power of attorney.  Draylon Mercadel is Philip Robenolt's [name]-in-law.  We began to discuss diagnosis prognosis, GOC, EOL wishes, disposition and options. Rod Holler tells me that healthcare power of attorney and living will paperwork was faxed to the hospital.  I introduced Palliative Medicine as specialized medical care for people living with serious illness. It focuses on providing relief from the symptoms and stress of a serious illness. The Robenolt's are familiar with palliative medicine from past experiences with their parents.  They were appreciative of our involvement.  We discussed a brief life review of the patient.  Eduardo [name] is originally from [place]  he and his [name] of over 72 years lived in the [place] area and moved to [place] approximately 18 years ago.  Unfortunately Eduardo [name] passed away in 22-Feb-2016 after they have been married for over 30 years.  They had no children.  Eduardo [name] has 1 living blood relative his [name] Eduardo [name] who has developmental delay and has lived in a group home for over 40 years.  Eduardo [name] for the last several years has been [employer].  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  He was very excited about retiring on December 15.  He greatly enjoys fishing.  His [name] and [name] describe him as a homebody.  We discussed his current illness and what it means in the larger context of his on-going co-morbidities.  Natural disease trajectory and expectations at EOL were discussed.  I attempted to elicit values and goals of care important to the patient.  Rod Holler encouraged me to talk to Eduardo [name], Eduardo [name] about values that are important to him.  In general she felt as though he would want to be functional and not live a life of dependency.  We discussed his advanced directive which  indicates if he is near end-of-life he does not want life support.  Rod Holler and Arnette Norris expressed that they want to wait to gather all the information from the medical team about options and then talk with Marzetta Board (and hopefully Eduardo [name]!) about his goals of care.  I supported this very appropriate sentiment.  I did mention to Rod Holler and Arnette Norris that currently Eduardo [name] is listed as a full code.  If he were to have a cardiac arrest the chances of restoring his body to a fully functional status would be virtually none and consequently heroic measures are probably not in his best interest.  I will talk further about CODE STATUS with Lajean Saver, and Arnette Norris as we find out more about how Eduardo [name] is progressing.  I then attempted to call Joycelyn Man but unfortunately was only able to leave a voicemail.  Questions  and concerns were addressed.  The family was encouraged to call with questions or concerns.    Primary Decision Maker:  Rhea Pink and Loistine Simas    SUMMARY OF RECOMMENDATIONS    PMT will follow with you. Continue current care. Advanced directives indicate that he would not want life support if at end-of-life  Code Status/Advance Care Planning:  Full code   Symptom Management:   Per primary team  Additional Recommendations (Limitations, Scope, Preferences):  Full Scope Treatment  Palliative Prophylaxis:   Frequent Pain Assessment and Turn Reposition  Psycho-social/Spiritual:   Desire for further Chaplaincy support: Not yet discussed  Prognosis: Very concerning.  Patient is at high risk for acute decline in death.  Better information regarding prognosis will develop as Eduardo condition evolves.    Discharge Planning: To Be Determined      Primary Diagnoses: Present on Admission: . Acute pulmonary edema (HCC) . Non-ST elevation (NSTEMI) myocardial infarction ([place]) . Acute respiratory distress . Acute on chronic systolic heart failure ([place])   I have reviewed the medical record, interviewed the patient and family, and examined the patient. The following aspects are pertinent.  Past Medical History:  Diagnosis Date  . Chronic kidney disease    stage 3  . Colon polyp   . Coronary artery disease   . Diabetes mellitus 1987   under care of Dr. Chalmers Cater.  On insulin since 96 (off and on)  . Diabetic retinopathy   . Dupuytren contracture    R hand, s/p injection (Dr. Lenon Curt)  . Essential hypertension, benign   . Essential hypertension, benign 02/06/2019  . Frequency of urination and polyuria   . Hypertension   . Myocardial infarction [facility])    denies  . Neuromuscular disorder ([place])    Diabetic neuropathy  . Osteomyelitis ([place])    right foot  . [name] testicular hypofunction   . Peripheral arterial disease ([place]) 10/28/2012  . Peritoneal abscess ([place]) 6/08    and buttock.  . Pneumonia   . Polydipsia   . Proteinuria   . Pure hyperglyceridemia   . Subacute osteomyelitis, right ankle and foot ([place])   . Wears glasses    Social History   Socioeconomic History  . Marital status: Widowed    Spouse name: Not on file  . Number of children: 0  . Years of education: Not on file  . Highest education level: Not on file  Occupational History  . 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 Smoking status: Former Smoker    Packs/day: 1.00    Years:  30.00    Pack years: 30.00    Types: Cigarettes    Quit date: 01/08/2012    Years since quitting: 8.7  . Smokeless tobacco: Never Used  Vaping Use  . Vaping Use: Never used  Substance and Sexual Activity  . Alcohol use: Yes    Comment: rare  . Drug use: No  . Sexual activity: Yes    Partners: Female    Birth control/protection: Condom  [name] Topics Concern  . Not on file  Social History Narrative   Widowed.    Girlfriends stays with him on weekends   Previously traveled 40 weeks out of the year. No longer travels at all (since COVID none, cut back on travel in 2019). No pets   Working from home.   Social Determinants of Health   Financial Resource Strain:   . Difficulty of Paying Living Expenses: Not on file  Food Insecurity:   . Worried About Charity fundraiser in the Last Year: Not on file  . Ran Out of Food in the Last Year: Not on file  Transportation Needs:   . Lack of Transportation (Medical): Not on file  . Lack of Transportation (Non-Medical): Not on file  Physical Activity:   . Days of Exercise per Week: Not on file  . Minutes of Exercise per Session: Not on file  Stress:   . Feeling of Stress : Not on file  Social Connections:   . Frequency of Communication with Friends and Family: Not on file  . Frequency of Social Gatherings with Friends and Family: Not on file  . Attends Religious Services: Not on file   . Active Member of Clubs or Organizations: Not on file  . Attends Archivist Meetings: Not on file  . Marital Status: Not on file   Family History  Problem Relation Age of Onset  . Diabetes Mother   . Hearing loss Mother   . Hypertension Mother   . Hyperlipidemia Mother   . Heart disease Mother   . Varicose Veins Mother   . Varicose Veins Father   . Dementia Father   . Hyperlipidemia [name]   . Diabetes Maternal Grandmother     Allergies  Allergen Reactions  . Food Nausea And Vomiting    Mussels cause nausea/vomiting      Vital Signs: BP 104/73   Pulse 76   Temp (!) 100.6 F (38.1 C)   Resp 20   Ht 5\' 10"  (1.778 m)   Wt (!) 145.8 kg   SpO2 100%   BMI 46.12 kg/m  Pain Scale: CPOT   Pain Score: 0-No pain   SpO2: SpO2: 100 % O2 Device:SpO2: 100 % O2 Flow Rate: .     Palliative Assessment/Data: 10%     Time In: 830 Time Out: 930 Time Total: 60 minutes Visit consisted of counseling and education dealing with the complex and emotionally intense issues surrounding the need for palliative care and symptom management in the setting of serious and potentially life-threatening illness. Greater than 50%  of this time was spent counseling and coordinating care related to the above assessment and plan.  Signed by: Florentina Jenny, PA-C Palliative Medicine  Please contact Palliative Medicine Team phone at [phone] for questions and concerns.  For individual provider: See Shea Evans

## 2020-10-12 NOTE — Progress Notes (Addendum)
ETT found at 24cm, RT advanced ETT back to 26cm. CXR pending.

## 2020-10-12 NOTE — Progress Notes (Signed)
NUTRITION NOTE RD working remotely.  Consult entered today at [phone] for initiation and management of TF.   Patient was on the vent with OGT in place and full assessment completed by another RD in person yesterday.   Patient was extubated and OGT removed today at 1100 or a few minutes after. Patient remains NPO at this time. No enteral access.  RD will continue to follow for plan and needs.       Jarome Matin, MS, RD, LDN, CNSC Inpatient Clinical Dietitian RD pager # available in [place]  After hours/weekend pager # available in [facility]

## 2020-10-12 NOTE — Progress Notes (Signed)
  Echocardiogram 2D Echocardiogram has been performed.  Johny Chess 10/12/2020, 1:16 PM

## 2020-10-12 NOTE — Progress Notes (Addendum)
Advanced Heart Failure Rounding Note   Subjective:    Remains intubated/sedated. Unresponsive.  hstrop peaked > 27K. On NE 16   Co-ox 83% Creatinine stable at 2.2. K down to 3.7   Objective:   Weight Range:  Vital Signs:   Temp:  [97.3 F (36.3 C)-101.1 F (38.4 C)] 100.9 F (38.3 C) (12/04 0730) Pulse Rate:  [25-117] 43 (12/04 0730) Resp:  [0-22] 20 (12/04 0730) BP: (60-120)/(23-84) 109/60 (12/04 0700) SpO2:  [97 %-100 %] 99 % (12/04 0730) Arterial Line BP: (96-142)/(38-56) 139/47 (12/04 0730) FiO2 (%):  [50 %-60 %] 50 % (12/04 0713) Weight:  [145.8 kg] 145.8 kg (12/04 0615) Last BM Date:  (PTA)  Weight change: Filed Weights   10/11/20 0444 10/12/20 0615  Weight: (!) 144 kg (!) 145.8 kg    Intake/Output:   Intake/Output Summary (Last 24 hours) at 10/12/2020 0814 Last data filed at 10/12/2020 0700 Gross per 24 hour  Intake 2048.33 ml  Output 1515 ml  Net 533.33 ml     Physical Exam: General:  Intubated/sedated HEENT: normal + ETT Neck: supple.  +LIJ TLC  Carotids 2+ bilat; no bruits. No lymphadenopathy or thryomegaly appreciated. Cor: PMI nondisplaced. Irregular rate & rhythm. No rubs, gallops or murmurs. Lungs: clear Abdomen: obese soft, nontender, nondistended. No hepatosplenomegaly. No bruits or masses. Good bowel sounds. Extremities: no cyanosis, clubbing, rash, edema cool  Neuro: intubated/sedated   Telemetry: Sinus 60-70s Personally reviewed   Labs: Basic Metabolic Panel: Recent Labs  Lab 10/11/20 0555 10/11/20 0555 10/11/20 0757 10/11/20 0757 10/11/20 0918 10/11/20 1143 10/11/20 1424 10/11/20 2139 10/12/20 0511  NA 139   < > 137  --  138 136 137  --  140  K 6.2*   < > 6.9*  --  6.4* 6.1* 4.6  --  3.7  CL 103  --  100  --   --  102 106  --  106  CO2 23  --  27  --   --  22 23  --  20*  GLUCOSE 432*  --  413*  --   --  468* 319*  --  216*  BUN 32*  --  33*  --   --  36* 39*  --  43*  CREATININE 1.83*  --  1.90*  --   --  2.13* 2.21*   --  2.26*  CALCIUM 8.8*   < > 8.6*   < >  --  8.7* 8.6*  --  8.6*  MG  --   --   --   --   --   --   --  2.5* 2.2  PHOS  --   --   --   --   --   --   --  2.2* 3.6   < > = values in this interval not displayed.    Liver Function Tests: Recent Labs  Lab 10/11/20 1143  AST 124*  ALT 35  ALKPHOS 65  BILITOT 0.8  PROT 6.2*  ALBUMIN 3.4*   No results for input(s): LIPASE, AMYLASE in the last 168 hours. No results for input(s): AMMONIA in the last 168 hours.  CBC: Recent Labs  Lab 10/11/20 0500 10/11/20 0918 10/12/20 0511  WBC 17.1*  --  16.3*  NEUTROABS 10.5*  --   --   HGB 17.7* 15.6 16.0  HCT 56.5* 46.0 49.0  MCV 95.8  --  92.1  PLT 229  --  176    Cardiac  Enzymes: No results for input(s): CKTOTAL, CKMB, CKMBINDEX, TROPONINI in the last 168 hours.  BNP: BNP (last 3 results) Recent Labs    10/11/20 0500  BNP 575.7*    ProBNP (last 3 results) No results for input(s): PROBNP in the last 8760 hours.    Other results:  Imaging: DG Chest 1 View  Result Date: 10/11/2020 CLINICAL DATA:  Intubation.  Respiratory failure. EXAM: CHEST  1 VIEW COMPARISON:  10/11/2020. FINDINGS: Endotracheal tube, NG tube in stable position. Stable cardiomegaly. Low lung volumes. Diffuse bilateral pulmonary infiltrates/edema again noted. Slight improvement from prior exam. Small left pleural effusion cannot be excluded. No pneumothorax. IMPRESSION: 1. Lines and tubes in stable position. 2. Stable cardiomegaly. 3. Low lung volumes. Diffuse bilateral pulmonary infiltrates/edema again noted. Slight improvement from prior exam. Small left pleural effusion cannot be excluded. Electronically Signed   By: Marcello Moores  Register   On: 10/11/2020 10:01   DG Abdomen 1 View  Result Date: 10/11/2020 CLINICAL DATA:  Nasogastric tube placement EXAM: ABDOMEN - 1 VIEW COMPARISON:  None FINDINGS: Nasogastric tube tip is seen within the left upper quadrant the abdomen within the expected mid body of the stomach.  The abdomen is largely obscured. Perihilar pulmonary infiltrate again noted. IMPRESSION: Nasogastric tube within the mid body of the stomach. Electronically Signed   By: Fidela Salisbury MD   On: 10/11/2020 05:35   CARDIAC CATHETERIZATION  Result Date: 10/11/2020 Left Heart Catheterization 10/11/20: LV: Severely dilated.  Global hypokinesis.  Hand contrast injection hence not fully adequately visualized.  LVEF 15 to 20%.  EDP markedly elevated at 29 mmHg.  No pressure gradient across the aortic valve. Left main: Normal. LAD: Severely diffusely diseased.  Gives origin to large D1 which gives collaterals to the LAD and 2 smaller diagonals, LAD is occluded after the origin of D1, anatomy compared to prior in 2017 reveals progression of diffuse disease.  There are ipsilateral and contralateral collaterals to the LAD. CX: Moderate sized vessel, giving origin to large OM1.  OM1 is occluded in the ostium as was noted previously and has ipsilateral collaterals.  OM1 is diffusely diseased and faintly filled. RCA: Moderate disease in the proximal and mid segment.  At the bifurcation of PDA and PL, there is a high-grade 90% stenosis.  The bifurcation is also involved with at least a 90% stenosis in the PDA and a 60 to 70% stenosis in the PL branch.  There is no target as distally as the PL branch which is large is occluded distally that was also noted in 2017.  There are faint collaterals noted from the left to the RCA. Impression: Severe native vessel three-vessel coronary artery disease with no significant targets for revascularization, LAD may be amenable for revascularization but I am not sure that this would help.  Patient is extremely ill with high risk for mortality.  He also has underlying stage III-IV kidney disease and contrast nephropathy needs to be evaluated further in view of contrast load of 70 mL. I try to reach his friend on the contact list, unable to reach.  DG CHEST PORT 1 VIEW  Result Date:  10/11/2020 CLINICAL DATA:  Central line placement. EXAM: PORTABLE CHEST 1 VIEW COMPARISON:  10/11/2020 at 0828 hours FINDINGS: A new right jugular catheter projects over the upper SVC. Endotracheal tube terminates at the clavicular heads, unchanged. Enteric tube courses into the abdomen with tip not imaged. The cardiac silhouette remains enlarged. There are persistent interstitial and airspace opacities bilaterally which are greatest  in the bases. Aeration of the mid upper lungs has mildly improved. Small pleural effusions are questioned. No pneumothorax is identified. IMPRESSION: 1. New right jugular catheter terminating over the upper SVC. 2. Mildly improved bilateral lung aeration which may reflect decreased edema. Electronically Signed   By: Logan Bores M.D.   On: 10/11/2020 12:19   DG Chest Port 1 View  Result Date: 10/11/2020 CLINICAL DATA:  Respiratory failure EXAM: PORTABLE CHEST 1 VIEW COMPARISON:  None. FINDINGS: Endotracheal tube is seen 6.4 cm above the carina at the level of the clavicular heads. Nasogastric tube extends into the upper abdomen beyond the margin of the examination. The lungs are symmetrically well expanded. No pneumothorax or pleural effusion. There is marked bilateral perihilar interstitial pulmonary infiltrate, most in keeping with marked pulmonary edema, possibly cardiogenic in nature. IMPRESSION: Marked perihilar interstitial pulmonary edema, possibly cardiogenic in nature. Endotracheal tube 6.4 cm above the carina. Electronically Signed   By: Fidela Salisbury MD   On: 10/11/2020 05:33      Medications:     Scheduled Medications: . aspirin  81 mg Per Tube Daily  . chlorhexidine gluconate (MEDLINE KIT)  15 mL Mouth Rinse BID  . Chlorhexidine Gluconate Cloth  6 each Topical Daily  . clopidogrel  75 mg Per Tube Daily  . feeding supplement (PROSource TF)  90 mL Per Tube TID  . fentaNYL (SUBLIMAZE) injection  50 mcg Intravenous Once  . furosemide  80 mg Intravenous BID   . mouth rinse  15 mL Mouth Rinse 10 times per day  . pantoprazole (PROTONIX) IV  40 mg Intravenous QHS  . pneumococcal 23 valent vaccine  0.5 mL Intramuscular Tomorrow-1000  . rosuvastatin  20 mg Per Tube Daily  . sodium chloride flush  3 mL Intravenous Q12H     Infusions: . sodium chloride 10 mL/hr at 10/12/20 0700  . amiodarone 30 mg/hr (10/12/20 0700)  . feeding supplement (VITAL AF 1.2 CAL)    . fentaNYL infusion INTRAVENOUS 50 mcg/hr (10/12/20 0700)  . heparin 1,500 Units/hr (10/12/20 0700)  . insulin 1 mL/hr at 10/12/20 0700  . norepinephrine (LEVOPHED) Adult infusion 16 mcg/min (10/12/20 0700)  . propofol (DIPRIVAN) infusion 30 mcg/kg/min (10/12/20 0700)     PRN Medications:  sodium chloride, acetaminophen, dextrose, fentaNYL, ondansetron (ZOFRAN) IV, sodium chloride flush   Assessment/Plan:   1. Acute on chronic heart failure - Echo 07/2016: EF 40%, grade I DD, inferior/lateral hypokinesis - cath 10/11/20 EF 15%. Echo pending - co-ox 83% on NE 16 - CVP 10. Continue IV lasix  - Await echo  - Hold b-blockers in the setting of shock - Hold ACEI/ARNI in the setting of shock and AKI - Only option for meaningful survival at this time would be VAD but not currently a candidate with respiratory failure and renal failure. Await echo to see RV function and look for possible mechanical complications of MI  2. STEMI/CAD -EKG showing new LBBB with pulmonary edema taken to cath where was found to have severe 3V disease unable to revascularize. No clear culprit lesion but hs trop > 27K --Cath 07/2016: EF 45-50%, mid LAD occluded w/ collaterals, D1 30% & D2 80% stenosis, dominant RCA 30% stenosis, patent LIMA and RIMA. No targets for revascularized and decided on medical management.  -Cath 10/11/2020: EF 15-20%, LVEDP 29, severe LAD disease w/ collaterals form D1 and small diagonal branches, CX moderate size w/ OM1 occluded at ostium, RCA mod disease in proximal and mid segment  subtotall distall  No targets for revascularization.  - no options for revascularization - continue heparin for now. ASA/statin. Will need to stop Plavix if felt to be VAD canddiate at any point  3. Acute respiratory failure -Remains intubated - Mental status prevents wean currently -Management per CCM  4. AKI on CKD with hyperkalemia - Presentation Cr 1.83 w/ hyperkalemia - Baseline 1.2-1.4 - Creatinine stable at 2.2. Suspect ATN - Continue to follow  5. DM uncontrolled -hx of DM foot wound, s/p amputation 3 toes -A1c 8.0% (down from 8.8% Aug 2021) -SSI and insulin gtt  6. PAD s/p R SFA occlusion requiring DES  -Successful PTA and stenting of right SFA, prolonged procedure, difficult procedure with use of multiple balloons, guidewires and catheters. 100% stenosis reduced to 0% with implantation of 3 overlapping 6.0 x 120 x2; 6.0 x 100 mm Eluvia DES. -continue to monitor while on pressors  7. Morbid obesity - Body mass index is 46.12 kg/m.    CRITICAL CARE Performed by: Glori Bickers  Total critical care time: 35 minutes  Critical care time was exclusive of separately billable procedures and treating other patients.  Critical care was necessary to treat or prevent imminent or life-threatening deterioration.  Critical care was time spent personally by me (independent of midlevel providers or residents) on the following activities: development of treatment plan with patient and/or surrogate as well as nursing, discussions with consultants, evaluation of patient's response to treatment, examination of patient, obtaining history from patient or surrogate, ordering and performing treatments and interventions, ordering and review of laboratory studies, ordering and review of radiographic studies, pulse oximetry and re-evaluation of patient's condition.   Length of Stay: 1   Glori Bickers MD 10/12/2020, 8:14 AM  Advanced Heart Failure Team Pager [phone] (M-F; 7a -  4p)  Please contact [facility] for night-coverage after hours (4p -7a ) and weekends on [email or web address]

## 2020-10-12 NOTE — Progress Notes (Signed)
[facility] [facility] Progress Note Patient Name: Eduardo [name] DOB: September 18, 1956 MRN: [id number]   Date of Service  10/12/2020  HPI/Events of Note  Need orders for q 4 CBG  with SSI, CBG minutes ago was 185, just received Levemir 35 units and Insulin drip now off  eICU Interventions  - DC ed Insulin gtt. - Glycemic control q4, sensitive SSI ordered.      Intervention Category Intermediate Interventions: Hyperglycemia - evaluation and treatment  Elmer Sow 10/12/2020, 10:06 PM

## 2020-10-12 NOTE — Progress Notes (Signed)
ANTICOAGULATION CONSULT NOTE   Pharmacy Consult for IV Heparin Indication: chest pain/ACS  Patient Measurements: Height: 5\' 10"  (177.8 cm) Weight: (!) 145.8 kg (321 lb 6.9 oz) IBW/kg (Calculated) : 73 Heparin Dosing Weight: 107 kg  Vital Signs: Temp: 98.7 F (37.1 C) (12/04 1900) Temp Source: Oral (12/04 1900) BP: 95/52 (12/04 2230) Pulse Rate: 67 (12/04 2230)  Labs: Recent Labs    10/11/20 0500 10/11/20 0500 10/11/20 0555 10/11/20 0757 10/11/20 0918 10/11/20 1143 10/11/20 1424 10/11/20 1559 10/12/20 0511 10/12/20 1229 10/12/20 1515 10/12/20 2216  HGB 17.7*   < >  --   --  15.6  --   --   --  16.0  --   --   --   HCT 56.5*  --   --   --  46.0  --   --   --  49.0  --   --   --   PLT 229  --   --   --   --   --   --   --  176  --   --   --   HEPARINUNFRC  --   --   --   --   --   --   --   --  0.17*  --  0.23* 0.37  CREATININE  --   --    < > 1.90*  --    < > 2.21*  --  2.26* 2.21*  --   --   TROPONINIHS 97*  --   --  1,693*  --   --   --  >27,000*  --   --   --   --    < > = values in this interval not displayed.    Estimated Creatinine Clearance: 48.1 mL/min (A) (by C-G formula based on SCr of 2.21 mg/dL (H)).  Assessment: 64 years of age male with significant cardiac history including three vessel disease but no targets for CABG admitted due to breathing difficulty and concern for pulmonary edema and code STEMI called.   Patient s/p L-heart cath this AM showing acute on chronic heart failure and severe LAD disease with collaterals OM1 occlusion, and RCA with moderate disease. No targets for revascularization.   Patient's last high sensitivity troponin >27,0000. Pharmacy consulted to start IV heparin therapy for ACS.  12/4 PM update:  Heparin level therapeutic after rate increase  Goal of Therapy:  Heparin level 0.3-0.7 units/ml Monitor platelets by anticoagulation protocol: Yes   Plan:  Cont heparin 1750 units/hr Confirmatory heparin level with AM  labs  Narda Bonds, PharmD, [place] Pharmacist Phone: [phone] ;

## 2020-10-12 NOTE — Progress Notes (Signed)
ANTICOAGULATION CONSULT NOTE   Pharmacy Consult for IV Heparin Indication: chest pain/ACS  Patient Measurements: Height: 5\' 10"  (177.8 cm) Weight: (!) 145.8 kg (321 lb 6.9 oz) IBW/kg (Calculated) : 73 Heparin Dosing Weight: 107 kg  Vital Signs: Temp: 100.9 F (38.3 C) (12/04 1245) Temp Source: Bladder (12/04 1200) BP: 123/86 (12/04 1600) Pulse Rate: 74 (12/04 1600)  Labs: Recent Labs    10/11/20 0500 10/11/20 0500 10/11/20 0555 10/11/20 0757 10/11/20 0918 10/11/20 1143 10/11/20 1424 10/11/20 1559 10/12/20 0511 10/12/20 1229 10/12/20 1515  HGB 17.7*   < >  --   --  15.6  --   --   --  16.0  --   --   HCT 56.5*  --   --   --  46.0  --   --   --  49.0  --   --   PLT 229  --   --   --   --   --   --   --  176  --   --   HEPARINUNFRC  --   --   --   --   --   --   --   --  0.17*  --  0.23*  CREATININE  --   --    < > 1.90*  --    < > 2.21*  --  2.26* 2.21*  --   TROPONINIHS 97*  --   --  1,693*  --   --   --  >27,000*  --   --   --    < > = values in this interval not displayed.    Estimated Creatinine Clearance: 48.1 mL/min (A) (by C-G formula based on SCr of 2.21 mg/dL (H)).  Assessment: 64 years of age male with significant cardiac history including three vessel disease but no targets for CABG admitted due to breathing difficulty and concern for pulmonary edema and code STEMI called.   Patient s/p L-heart cath this AM showing acute on chronic heart failure and severe LAD disease with collaterals OM1 occlusion, and RCA with moderate disease. No targets for revascularization.   Patient's last high sensitivity troponin >27,0000. Pharmacy consulted to start IV heparin therapy for ACS.  Heparin level this evening remains SUBtherapeutic though trending up (HL 0.23 << 0.17, goal of 0.3-0.7). No bleeding or issues noted per discussion with RN.   Goal of Therapy:  Heparin level 0.3-0.7 units/ml Monitor platelets by anticoagulation protocol: Yes   Plan:  - Increase  Heparin to 1750 units/hr (17.5 ml/hr) - Will continue to monitor for any signs/symptoms of bleeding and will follow up with heparin level in 6 hours   Thank you for allowing pharmacy to be a part of this patient's care.  Alycia Rossetti, PharmD, BCPS Clinical Pharmacist Clinical phone for 10/12/2020: [phone] 10/12/2020 4:31 PM   **Pharmacist phone directory can now be found on [email or web address] ([email or web address]).  Listed under [place].

## 2020-10-12 NOTE — Procedures (Signed)
Extubation Procedure Note  Patient Details:   Name: Eduardo [name] DOB: Sep 10, 1956 MRN: [id number]   Airway Documentation:    Vent end date: 10/12/20 Vent end time: 1101   Evaluation  O2 sats: stable throughout Complications: No apparent complications Patient did tolerate procedure well. Bilateral Breath Sounds: Clear, Diminished   Yes  Placed on 4l/min [name] Incentive spirometer instructed and performed 1.2L  Revonda Standard 10/12/2020, 11:02 AM

## 2020-10-12 NOTE — Progress Notes (Signed)
[place] for IV Heparin Indication: chest pain/ACS  Allergies  Allergen Reactions  . Food Nausea And Vomiting    Mussels cause nausea/vomiting    Patient Measurements: Height: 5\' 10"  (177.8 cm) Weight: (!) 145.8 kg (321 lb 6.9 oz) IBW/kg (Calculated) : 73 Heparin Dosing Weight: 107 kg  Vital Signs: Temp: 100.9 F (38.3 C) (12/04 0615) Temp Source: Bladder (12/04 0000) BP: 114/79 (12/04 0600) Pulse Rate: 46 (12/04 0615)  Labs: Recent Labs    10/11/20 0500 10/11/20 0500 10/11/20 0555 10/11/20 0757 10/11/20 0757 10/11/20 0918 10/11/20 1143 10/11/20 1424 10/11/20 1559 10/12/20 0511  HGB 17.7*   < >  --   --   --  15.6  --   --   --  16.0  HCT 56.5*  --   --   --   --  46.0  --   --   --  49.0  PLT 229  --   --   --   --   --   --   --   --  176  HEPARINUNFRC  --   --   --   --   --   --   --   --   --  0.17*  CREATININE  --   --    < > 1.90*   < >  --  2.13* 2.21*  --  2.26*  TROPONINIHS 97*  --   --  1,693*  --   --   --   --  >27,000*  --    < > = values in this interval not displayed.    Estimated Creatinine Clearance: 47.1 mL/min (A) (by C-G formula based on SCr of 2.26 mg/dL (H)).   Medical History: Past Medical History:  Diagnosis Date  . Chronic kidney disease    stage 3  . Colon polyp   . Coronary artery disease   . Diabetes mellitus 1987   under care of Dr. Chalmers Cater.  On insulin since 96 (off and on)  . Diabetic retinopathy   . Dupuytren contracture    R hand, s/p injection (Dr. Lenon Curt)  . Essential hypertension, benign   . Essential hypertension, benign 02/06/2019  . Frequency of urination and polyuria   . Hypertension   . Myocardial infarction [facility])    denies  . Neuromuscular disorder ([place])    Diabetic neuropathy  . Osteomyelitis ([place])    right foot  . Other testicular hypofunction   . Peripheral arterial disease ([place]) 10/28/2012  . Peritoneal abscess ([place]) 6/08   and buttock.  . Pneumonia   .  Polydipsia   . Proteinuria   . Pure hyperglyceridemia   . Subacute osteomyelitis, right ankle and foot ([place])   . Wears glasses     Assessment: 64 years of age male with significant cardiac history including three vessel disease but no targets for CABG admitted due to breathing difficulty and concern for pulmonary edema and code STEMI called.  Patient s/p L-heart cath this AM showing acute on chronic heart failure and severe LAD disease with collaterals OM1 occlusion, and RCA with moderate disease. No targets for revascularization.   Patient's last high sensitivity troponin >27,0000. Pharmacy consulted to start IV heparin therapy for ACS.   12/4 AM update:  Heparin level low No issues per RN  Goal of Therapy:  Heparin level 0.3-0.7 units/ml Monitor platelets by anticoagulation protocol: Yes   Plan:  -Inc heparin to 1500 units/hr -Re-check heparin  level in 6-8 hours  Narda Bonds, PharmD, [place] Clinical Pharmacist Phone: [phone]

## 2020-10-13 DIAGNOSIS — J810 Acute pulmonary edema: Secondary | ICD-10-CM | POA: Diagnosis not present

## 2020-10-13 DIAGNOSIS — I5023 Acute on chronic systolic (congestive) heart failure: Secondary | ICD-10-CM | POA: Diagnosis not present

## 2020-10-13 DIAGNOSIS — N179 Acute kidney failure, unspecified: Secondary | ICD-10-CM | POA: Diagnosis not present

## 2020-10-13 DIAGNOSIS — E875 Hyperkalemia: Secondary | ICD-10-CM | POA: Diagnosis not present

## 2020-10-13 DIAGNOSIS — N189 Chronic kidney disease, unspecified: Secondary | ICD-10-CM | POA: Diagnosis not present

## 2020-10-13 LAB — HEPARIN LEVEL (UNFRACTIONATED): Heparin Unfractionated: 0.61 IU/mL (ref 0.30–0.70)

## 2020-10-13 LAB — CBC
HCT: 42.5 % (ref 39.0–52.0)
Hemoglobin: 13.9 g/dL (ref 13.0–17.0)
MCH: 30.2 pg (ref 26.0–34.0)
MCHC: 32.7 g/dL (ref 30.0–36.0)
MCV: 92.2 fL (ref 80.0–100.0)
Platelets: UNDETERMINED 10*3/uL (ref 150–400)
RBC: 4.61 MIL/uL (ref 4.22–5.81)
RDW: 13.2 % (ref 11.5–15.5)
WBC: 11.3 10*3/uL — ABNORMAL HIGH (ref 4.0–10.5)
nRBC: 0 % (ref 0.0–0.2)

## 2020-10-13 LAB — BASIC METABOLIC PANEL
Anion gap: 11 (ref 5–15)
BUN: 35 mg/dL — ABNORMAL HIGH (ref 8–23)
CO2: 26 mmol/L (ref 22–32)
Calcium: 8 mg/dL — ABNORMAL LOW (ref 8.9–10.3)
Chloride: 101 mmol/L (ref 98–111)
Creatinine, Ser: 1.9 mg/dL — ABNORMAL HIGH (ref 0.61–1.24)
GFR, Estimated: 39 mL/min — ABNORMAL LOW (ref >60)
Glucose, Bld: 190 mg/dL — ABNORMAL HIGH (ref 70–99)
Potassium: 3.2 mmol/L — ABNORMAL LOW (ref 3.5–5.1)
Sodium: 138 mmol/L (ref 135–145)

## 2020-10-13 LAB — GLUCOSE, CAPILLARY
Glucose-Capillary: 171 mg/dL — ABNORMAL HIGH (ref 70–99)
Glucose-Capillary: 186 mg/dL — ABNORMAL HIGH (ref 70–99)
Glucose-Capillary: 190 mg/dL — ABNORMAL HIGH (ref 70–99)
Glucose-Capillary: 190 mg/dL — ABNORMAL HIGH (ref 70–99)
Glucose-Capillary: 218 mg/dL — ABNORMAL HIGH (ref 70–99)
Glucose-Capillary: 231 mg/dL — ABNORMAL HIGH (ref 70–99)
Glucose-Capillary: 259 mg/dL — ABNORMAL HIGH (ref 70–99)

## 2020-10-13 LAB — COOXEMETRY PANEL
Carboxyhemoglobin: 1.2 % (ref 0.5–1.5)
Methemoglobin: 1.2 % (ref 0.0–1.5)
O2 Saturation: 74.8 %
Total hemoglobin: 14.4 g/dL (ref 12.0–16.0)

## 2020-10-13 MED ORDER — POLYETHYLENE GLYCOL 3350 17 G PO PACK
17.0000 g | PACK | Freq: Every day | ORAL | Status: DC
  Administered 2020-10-13 – 2020-10-15 (×3): 17 g via ORAL
  Filled 2020-10-13 (×3): qty 1

## 2020-10-13 MED ORDER — POTASSIUM CHLORIDE CRYS ER 20 MEQ PO TBCR
40.0000 meq | EXTENDED_RELEASE_TABLET | Freq: Four times a day (QID) | ORAL | Status: AC
  Administered 2020-10-13 (×2): 40 meq via ORAL
  Filled 2020-10-13 (×2): qty 2

## 2020-10-13 MED ORDER — PHENOL 1.4 % MT LIQD
1.0000 | OROMUCOSAL | Status: DC | PRN
  Administered 2020-10-13: 1 via OROMUCOSAL
  Filled 2020-10-13: qty 177

## 2020-10-13 MED ORDER — PANTOPRAZOLE SODIUM 40 MG PO TBEC
40.0000 mg | DELAYED_RELEASE_TABLET | Freq: Every day | ORAL | Status: DC
  Administered 2020-10-13 – 2020-10-14 (×2): 40 mg via ORAL
  Filled 2020-10-13 (×2): qty 1

## 2020-10-13 MED ORDER — EMPAGLIFLOZIN 10 MG PO TABS
10.0000 mg | ORAL_TABLET | Freq: Every day | ORAL | Status: DC
  Administered 2020-10-13 – 2020-10-15 (×3): 10 mg via ORAL
  Filled 2020-10-13 (×3): qty 1

## 2020-10-13 MED ORDER — CARVEDILOL 3.125 MG PO TABS
3.1250 mg | ORAL_TABLET | Freq: Two times a day (BID) | ORAL | Status: DC
  Administered 2020-10-13 – 2020-10-14 (×2): 3.125 mg via ORAL
  Filled 2020-10-13 (×2): qty 1

## 2020-10-13 MED ORDER — POTASSIUM CHLORIDE CRYS ER 20 MEQ PO TBCR
40.0000 meq | EXTENDED_RELEASE_TABLET | Freq: Once | ORAL | Status: AC
  Administered 2020-10-13: 40 meq via ORAL
  Filled 2020-10-13: qty 2

## 2020-10-13 MED ORDER — ENOXAPARIN SODIUM 80 MG/0.8ML ~~LOC~~ SOLN
70.0000 mg | SUBCUTANEOUS | Status: DC
  Administered 2020-10-13 – 2020-10-14 (×2): 70 mg via SUBCUTANEOUS
  Filled 2020-10-13: qty 0.7
  Filled 2020-10-13: qty 0.8

## 2020-10-13 NOTE — Progress Notes (Signed)
   NAME:  Eduardo [name], MRN:  [id number], DOB:  06/24/1956, LOS: 2 ADMISSION DATE:  10/11/2020, CONSULTATION DATE:  10/11/20 REFERRING MD:  Einar Gip, CHIEF COMPLAINT:  SOB   Brief History   64 year old man with metabolic syndrome, CKD, DM2, PVD presenting with SOB found to be in acute pulmonary edema from suspected STEMI emergently intubated in ER.  History of present illness   64 year old man with metabolic syndrome, CKD, DM2, PVD presenting with SOB found to be in acute pulmonary edema from suspected STEMI emergently intubated in ER.  Sent to cath lab for new LBBB, severe 3V disease with no revascularization targets found.  Brought to 2H for further management, PCCM consulted to assist with management.  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  Patient is extremely ill with high risk for mortality.  He also has underlying stage III-IV kidney disease and contrast nephropathy needs to be evaluated further in view of contrast load of 70 mL. I try to reach his friend on the contact list, unable to reach.   Micro Data:  COVID neg MRSA PCR neg  Antimicrobials:  Cefazolin x 1   Interim history/subjective:  No events, extubated.  On room air, denies chest pain.  Objective   Blood pressure 137/85, pulse 80, temperature 98.2 F (36.8 C), temperature source Oral, resp. rate (!) 28, height 5\' 10"  (1.778 m), weight (!) 141 kg, SpO2 94 %. CVP:  [11 mmHg-22 mmHg] 22 mmHg  Vent Mode:  PSV;CPAP FiO2 (%):  [40 %] 40 % PEEP:  [5 cmH20] 5 cmH20 Pressure Support:  [5 cmH20] 5 cmH20   Intake/Output Summary (Last 24 hours) at 10/13/2020 0825 Last data filed at 10/13/2020 0700 Gross per 24 hour  Intake 1879.23 ml  Output 4775 ml  Net -2895.77 ml   Filed Weights   10/11/20 0444 10/12/20 0615 10/13/20 0600  Weight: (!) 144 kg (!) 145.8 kg (!) 141 kg    Examination: Constitutional: no acute distress sitting in chair  Eyes: EOMI, pupils equal Ears, nose, mouth, and throat: MMM, trachea midline Cardiovascular: RRR, ext warm Respiratory: Diminished bases, no wheezing Gastrointestinal: Soft, +BS Skin: No rashes, normal turgor Neurologic: Moves all 4 ext to command Psychiatric: RASS 0  CXR stable pulmonary edema -3L urine output WBC improved  Resolved Hospital Problem list   n/a  Assessment & Plan:  -Acute hypoxemic respiratory failure secondary to pulmonary edema, much improved -STEMI, cardiogenic shock, severe ischemic cardiomyopathy- improved, [place]ox good this AM, no good targets for PCI or CABG per TCTS/cardiology -Low grade fevers- resolved -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 + recent dye load, improved -Hyperkalemia question ACE-I effect- resolved -DM2 with severe hyperglycemia- resolved, now on basal bolus -Severe PVD -Class 3 obesity  - IS, flutter, OOB - PT/OT consults - HF team to consider VAD vs. Watchful waiting - Continue levemir 35 units BID with SSI, adjust PRN - Advance diet - Start bowel regimen - Stable for transfer to progressive - PCCM available PRN  Erskine Emery MD PCCM

## 2020-10-13 NOTE — Progress Notes (Signed)
Subjective:  Feels much better this morning, extubated last afternoon.  Patient states that he does not recollect what happened after he had called the EMS.  Intake/Output from previous day:  I/O last 3 completed shifts: In: 3409.9 [P.O.:720; I.V.:2375.9; NG/GT:314] Out: 6075 [Urine:5975; Emesis/NG output:100] Total I/O In: 803.1 [P.O.:720; I.V.:83.1] Out: 555 [Urine:555]  Blood pressure (!) 120/59, pulse 76, temperature 97.8 F (36.6 C), temperature source Oral, resp. rate 15, height 5' 10"  (1.778 m), weight (!) 141 kg, SpO2 95 %.  Vitals with BMI 10/13/2020 10/13/2020 10/13/2020  Height - - -  Weight - - -  BMI - - -  Systolic 831 517 616  Diastolic 59 66 75  Pulse 76 113 77     Physical Exam Constitutional:      Appearance: He is obese.  HENT:     Head: Atraumatic.     Mouth/Throat:     Mouth: Mucous membranes are moist.  Eyes:     Extraocular Movements: Extraocular movements intact.  Cardiovascular:     Rate and Rhythm: Normal rate.     Pulses:          Carotid pulses are 2+ on the right side and 2+ on the left side.      Popliteal pulses are 0 on the right side and 0 on the left side.       Dorsalis pedis pulses are 0 on the right side and 0 on the left side.       Posterior tibial pulses are 0 on the right side and 0 on the left side.     Heart sounds: Heart sounds are distant. No gallop.   Pulmonary:     Effort: Pulmonary effort is normal.     Breath sounds: Rales (bibasilar) present.  Abdominal:     General: Abdomen is flat. Bowel sounds are normal.     Palpations: Abdomen is soft.     Comments: Obese, pannus present  Musculoskeletal:        General: No swelling. Normal range of motion.     Cervical back: Normal range of motion and neck supple.  Skin:    Capillary Refill: Capillary refill takes less than 2 seconds.  Neurological:     General: No focal deficit present.     Mental Status: He is alert and oriented to person, place, and time.  Psychiatric:          Mood and Affect: Mood normal.        Behavior: Behavior normal.     Lab Results: BMP BNP (last 3 results) Recent Labs    10/11/20 0500  BNP 575.7*    ProBNP (last 3 results) No results for input(s): PROBNP in the last 8760 hours. BMP Latest Ref Rng & Units 10/13/2020 10/12/2020 10/12/2020  Glucose 70 - 99 mg/dL 190(H) 219(H) 216(H)  BUN 8 - 23 mg/dL 35(H) 43(H) 43(H)  Creatinine 0.61 - 1.24 mg/dL 1.90(H) 2.21(H) 2.26(H)  BUN/Creat Ratio 10 - 24 - - -  Sodium 135 - 145 mmol/L 138 141 140  Potassium 3.5 - 5.1 mmol/L 3.2(L) 3.4(L) 3.7  Chloride 98 - 111 mmol/L 101 107 106  CO2 22 - 32 mmol/L 26 22 20(L)  Calcium 8.9 - 10.3 mg/dL 8.0(L) 8.4(L) 8.6(L)   Hepatic Function Latest Ref Rng & Units 10/11/2020 04/21/2019 09/28/2011  Total Protein 6.5 - 8.1 g/dL 6.2(L) 5.8(L) 6.5  Albumin 3.5 - 5.0 g/dL 3.4(L) 3.8 4.1  AST 15 - 41 U/L 124(H) 21 13  ALT 0 - 44 U/L 35 20 11  Alk Phosphatase 38 - 126 U/L 65 68 90  Total Bilirubin 0.3 - 1.2 mg/dL 0.8 0.4 0.3   CBC Latest Ref Rng & Units 10/13/2020 10/12/2020 10/11/2020  WBC 4.0 - 10.5 K/uL 11.3(H) 16.3(H) -  Hemoglobin 13.0 - 17.0 g/dL 13.9 16.0 15.6  Hematocrit 39 - 52 % 42.5 49.0 46.0  Platelets 150 - 400 K/uL PLATELET CLUMPS NOTED ON SMEAR, UNABLE TO ESTIMATE 176 -   Lipid Panel     Component Value Date/Time   CHOL 137 06/24/2020 0937   TRIG 144 10/12/2020 0511   HDL 33 (L) 06/24/2020 0937   CHOLHDL 4.2 06/24/2020 0937   CHOLHDL 4.4 09/28/2011 1358   VLDL 21 09/28/2011 1358   LDLCALC 72 06/24/2020 0937   Cardiac Panel (last 3 results) No results for input(s): CKTOTAL, CKMB, TROPONINI, RELINDX in the last 72 hours.  HEMOGLOBIN A1C Lab Results  Component Value Date   HGBA1C 8.0 (H) 10/11/2020   MPG 182.9 10/11/2020   TSH Recent Labs    06/24/20 0937  TSH 1.650   Imaging:  DG CHEST PORT 1 VIEW  Result Date: 10/12/2020 CLINICAL DATA:  Respiratory distress.  Follow-up exam. EXAM: PORTABLE CHEST 1 VIEW COMPARISON:   10/11/2020 FINDINGS: Vascular congestion with bilateral lower lung zone airspace opacities are without change. Upper lungs are clear. Suspect small effusions.  No pneumothorax. Endotracheal tube, right internal jugular central venous line and nasal/orogastric tube are stable. IMPRESSION: 1. No change from the previous day's exam. 2. Persistent vascular congestion and lower lung zone airspace opacities, the latter finding likely due to a combination of pleural fluid with atelectasis, with a possible component of either edema or infection. Electronically Signed   By: Lajean Manes M.D.   On: 10/12/2020 09:56   Cardiac Studies:  Echocardiogram 10/12/2020:  1. Not well visualized, LVH degree unable to be assessed due to poor echo window. . Left ventricular ejection fraction, by estimation, is 20 to 25%. The left ventricle has severely decreased function. The left ventricle demonstrates regional wall motion  abnormalities (see scoring diagram/findings for description). Left ventricular diastolic parameters are consistent with Grade I diastolic dysfunction (impaired relaxation).  2. Right ventricular systolic function is normal. The right ventricular size is normal.  3. The mitral valve is normal in structure. Trivial mitral valve regurgitation. No evidence of mitral stenosis.  4. The aortic valve is normal in structure. Aortic valve regurgitation is not visualized. No aortic stenosis is present.  5. The inferior vena cava is dilated in size with <50% respiratory variability, suggesting right atrial pressure of 15 mmHg.  EKG:  EKG 08/11/2020: Sinus tachycardia at rate of 120 bpm, atypical left bundle branch block.  No further analysis.  EKG 04/19/2020: Normal sinus rhythm at rate of 61 bpm, inferior infarct old. Anteroseptal infarct old. Nonspecific T abnormality. Borderline low voltage complexes.   Scheduled Meds: . aspirin  81 mg Oral Daily  . carvedilol  3.125 mg Oral BID WC  . chlorhexidine   15 mL Mouth Rinse BID  . Chlorhexidine Gluconate Cloth  6 each Topical Daily  . clopidogrel  75 mg Oral Daily  . empagliflozin  10 mg Oral Daily  . enoxaparin (LOVENOX) injection  70 mg Subcutaneous Q24H  . furosemide  80 mg Intravenous BID  . insulin aspart  0-9 Units Subcutaneous Q4H  . insulin detemir  35 Units Subcutaneous BID  . mouth rinse  15 mL Mouth Rinse q12n4p  .  pantoprazole  40 mg Oral QHS  . polyethylene glycol  17 g Oral Daily  . potassium chloride  40 mEq Oral Q6H  . rosuvastatin  20 mg Per Tube Daily  . sodium chloride flush  10-40 mL Intracatheter Q12H  . sodium chloride flush  3 mL Intravenous Q12H   Continuous Infusions: . sodium chloride Stopped (10/13/20 0847)  . feeding supplement (VITAL AF 1.2 CAL) Stopped (10/12/20 1100)   PRN Meds:.sodium chloride, acetaminophen, ondansetron (ZOFRAN) IV, phenol, pneumococcal 23 valent vaccine, sodium chloride flush, sodium chloride flush  Assessment/Plan:  1.  STEMI with acute presentation with pulmonary edema and cardiogenic shock 2.  New onset left bundle branch block 3.  Ischemic cardiomyopathy with severe LV systolic dysfunction 4.  Diffuse multivessel coronary artery disease with no targets for revascularization  Recommendation Appreciate Dr. Pierre Bali helping me with the management of complex cardiac shock.  Fortunately patient has recuperated well with regard to hemodynamics.  Will be transferred to telemetry, will continue close follow-up and monitoring with heart failure team.  Presently on low-dose beta-blocker, also started on Jardiance for diabetes and CAD, continue dual antiplatelet therapy with aspirin and Plavix for now and high intensity statin.  I have again discussed with the patient regarding lifestyle modification.  Will repeat EKG.      Adrian Prows, MD, [facility] 10/13/2020, 12:39 PM Office: [phone] Pager: [phone]

## 2020-10-13 NOTE — Progress Notes (Signed)
Advanced Heart Failure Rounding Note   Subjective:    Extubated yesterday. Diuresed well over 5L out. Sitting up in chair.   hstrop peaked > 27K. Off NE.   Co-ox 75% Creatinine improving 2.2 -> 1.9 (baseline 1.3). CVP 12  Feels better. Denies CP.   Echo EF 20-25% RV normal  Objective:   Weight Range:  Vital Signs:   Temp:  [98.2 F (36.8 C)-101.3 F (38.5 C)] 98.2 F (36.8 C) (12/05 0630) Pulse Rate:  [67-86] 77 (12/05 0900) Resp:  [12-28] 18 (12/05 0900) BP: (95-169)/(40-123) 122/75 (12/05 0900) SpO2:  [90 %-100 %] 95 % (12/05 0900) Arterial Line BP: (96-155)/(44-77) 127/45 (12/05 0800) FiO2 (%):  [40 %] 40 % (12/04 0943) Weight:  [141 kg] 141 kg (12/05 0600) Last BM Date:  (PTA)  Weight change: Filed Weights   10/11/20 0444 10/12/20 0615 10/13/20 0600  Weight: (!) 144 kg (!) 145.8 kg (!) 141 kg    Intake/Output:   Intake/Output Summary (Last 24 hours) at 10/13/2020 0918 Last data filed at 10/13/2020 0900 Gross per 24 hour  Intake 1864.95 ml  Output 4680 ml  Net -2815.05 ml     Physical Exam: General:  Sitting up in chair. No resp difficulty HEENT: normal Neck: supple. nJVP to jaw Carotids 2+ bilat; no bruits. No lymphadenopathy or thryomegaly appreciated. Cor: PMI nondisplaced. Regular rate & rhythm. No rubs, gallops or murmurs. Lungs: clear Abdomen: obese soft, nontender, nondistended. No hepatosplenomegaly. No bruits or masses. Good bowel sounds. Extremities: no cyanosis, clubbing, rash, edema Neuro: alert & orientedx3, cranial nerves grossly intact. moves all 4 extremities w/o difficulty. Affect pleasant   Telemetry: Sinus 70s Personally reviewed   Labs: Basic Metabolic Panel: Recent Labs  Lab 10/11/20 1143 10/11/20 1143 10/11/20 1424 10/11/20 1424 10/11/20 2139 10/12/20 0511 10/12/20 1229 10/13/20 0403  NA 136  --  137  --   --  140 141 138  K 6.1*  --  4.6  --   --  3.7 3.4* 3.2*  CL 102  --  106  --   --  106 107 101  CO2 22  --   23  --   --  20* 22 26  GLUCOSE 468*  --  319*  --   --  216* 219* 190*  BUN 36*  --  39*  --   --  43* 43* 35*  CREATININE 2.13*  --  2.21*  --   --  2.26* 2.21* 1.90*  CALCIUM 8.7*   < > 8.6*   < >  --  8.6* 8.4* 8.0*  MG  --   --   --   --  2.5* 2.2  --   --   PHOS  --   --   --   --  2.2* 3.6  --   --    < > = values in this interval not displayed.    Liver Function Tests: Recent Labs  Lab 10/11/20 1143  AST 124*  ALT 35  ALKPHOS 65  BILITOT 0.8  PROT 6.2*  ALBUMIN 3.4*   No results for input(s): LIPASE, AMYLASE in the last 168 hours. No results for input(s): AMMONIA in the last 168 hours.  CBC: Recent Labs  Lab 10/11/20 0500 10/11/20 0918 10/12/20 0511 10/13/20 0403  WBC 17.1*  --  16.3* 11.3*  NEUTROABS 10.5*  --   --   --   HGB 17.7* 15.6 16.0 13.9  HCT 56.5* 46.0 49.0 42.5  MCV 95.8  --  92.1 92.2  PLT 229  --  176 PLATELET CLUMPS NOTED ON SMEAR, UNABLE TO ESTIMATE    Cardiac Enzymes: No results for input(s): CKTOTAL, CKMB, CKMBINDEX, TROPONINI in the last 168 hours.  BNP: BNP (last 3 results) Recent Labs    10/11/20 0500  BNP 575.7*    ProBNP (last 3 results) No results for input(s): PROBNP in the last 8760 hours.    Other results:  Imaging: DG CHEST PORT 1 VIEW  Result Date: 10/12/2020 CLINICAL DATA:  Respiratory distress.  Follow-up exam. EXAM: PORTABLE CHEST 1 VIEW COMPARISON:  10/11/2020 FINDINGS: Vascular congestion with bilateral lower lung zone airspace opacities are without change. Upper lungs are clear. Suspect small effusions.  No pneumothorax. Endotracheal tube, right internal jugular central venous line and nasal/orogastric tube are stable. IMPRESSION: 1. No change from the previous day's exam. 2. Persistent vascular congestion and lower lung zone airspace opacities, the latter finding likely due to a combination of pleural fluid with atelectasis, with a possible component of either edema or infection. Electronically Signed   By: Lajean Manes M.D.   On: 10/12/2020 09:56   DG CHEST PORT 1 VIEW  Result Date: 10/11/2020 CLINICAL DATA:  Central line placement. EXAM: PORTABLE CHEST 1 VIEW COMPARISON:  10/11/2020 at 0828 hours FINDINGS: A new right jugular catheter projects over the upper SVC. Endotracheal tube terminates at the clavicular heads, unchanged. Enteric tube courses into the abdomen with tip not imaged. The cardiac silhouette remains enlarged. There are persistent interstitial and airspace opacities bilaterally which are greatest in the bases. Aeration of the mid upper lungs has mildly improved. Small pleural effusions are questioned. No pneumothorax is identified. IMPRESSION: 1. New right jugular catheter terminating over the upper SVC. 2. Mildly improved bilateral lung aeration which may reflect decreased edema. Electronically Signed   By: Logan Bores M.D.   On: 10/11/2020 12:19   ECHOCARDIOGRAM COMPLETE  Result Date: 10/12/2020    ECHOCARDIOGRAM REPORT   Patient Name:   Eduardo [name] Date of Exam: 10/12/2020 Medical Rec #:  [id number]    Height:       70.0 in Accession #:    [id number]   Weight:       321.4 lb Date of Birth:  1955-12-27   BSA:          2.554 m Patient Age:    64 years     BP:           136/92 mmHg Patient Gender: M            HR:           77 bpm. Exam Location:  Inpatient Procedure: 2D Echo and Intracardiac Opacification Agent Indications:     acute systolic chf 765.46  History:         Patient has no prior history of Echocardiogram examinations.                  CAD, chronic kidney disease; Risk Factors:Hypertension,                  Dyslipidemia and Diabetes.  Sonographer:     Johny Chess Referring Phys:  [id number] Sherlon Handing LIGGETT Diagnosing Phys: Adrian Prows MD  Sonographer Comments: Technically difficult study due to poor echo windows. Image acquisition challenging due to patient body habitus. IMPRESSIONS  1. Not well visualized, LVH degree unable to be assessed due to poor echo window. . Left ventricular  ejection fraction,  by estimation, is 20 to 25%. The left ventricle has severely decreased function. The left ventricle demonstrates regional wall motion  abnormalities (see scoring diagram/findings for description). Left ventricular diastolic parameters are consistent with Grade I diastolic dysfunction (impaired relaxation).  2. Right ventricular systolic function is normal. The right ventricular size is normal.  3. The mitral valve is normal in structure. Trivial mitral valve regurgitation. No evidence of mitral stenosis.  4. The aortic valve is normal in structure. Aortic valve regurgitation is not visualized. No aortic stenosis is present.  5. The inferior vena cava is dilated in size with <50% respiratory variability, suggesting right atrial pressure of 15 mmHg. FINDINGS  Left Ventricle: Not well visualized, LVH degree unable to be assessed due to poor echo window. Left ventricular ejection fraction, by estimation, is 20 to 25%. The left ventricle has severely decreased function. The left ventricle demonstrates regional wall motion abnormalities. Definity contrast agent was given IV to delineate the left ventricular endocardial borders. The left ventricular internal cavity size was normal in size. There is no left ventricular hypertrophy. Left ventricular diastolic parameters are consistent with Grade I diastolic dysfunction (impaired relaxation). Normal left ventricular filling pressure. Right Ventricle: The right ventricular size is normal. No increase in right ventricular wall thickness. Right ventricular systolic function is normal. Left Atrium: Left atrial size was normal in size. Right Atrium: Right atrial size was normal in size. Pericardium: There is no evidence of pericardial effusion. Mitral Valve: The mitral valve is normal in structure. Trivial mitral valve regurgitation. No evidence of mitral valve stenosis. Tricuspid Valve: The tricuspid valve is normal in structure. Tricuspid valve regurgitation  is not demonstrated. No evidence of tricuspid stenosis. Aortic Valve: The aortic valve is normal in structure. Aortic valve regurgitation is not visualized. No aortic stenosis is present. Pulmonic Valve: The pulmonic valve was not well visualized. Pulmonic valve regurgitation is not visualized. Aorta: The aortic root is normal in size and structure. Venous: The inferior vena cava is dilated in size with less than 50% respiratory variability, suggesting right atrial pressure of 15 mmHg. IAS/Shunts: The interatrial septum was not well visualized.   Diastology LV e' medial:    5.98 cm/s LV E/e' medial:  11.6 LV e' lateral:   10.40 cm/s LV E/e' lateral: 6.7  RIGHT VENTRICLE             IVC RV S prime:     14.30 cm/s  IVC diam: 2.30 cm TAPSE (M-mode): 2.4 cm LEFT ATRIUM             Index       RIGHT ATRIUM           Index LA Vol (A2C):   72.0 ml 28.19 ml/m RA Area:     12.60 cm LA Vol (A4C):   68.1 ml 26.67 ml/m RA Volume:   30.10 ml  11.79 ml/m LA Biplane Vol: 74.4 ml 29.13 ml/m  AORTIC VALVE LVOT Vmax:   84.20 cm/s LVOT Vmean:  55.800 cm/s LVOT VTI:    0.162 m  AORTA Ao Asc diam: 3.40 cm MITRAL VALVE MV Area (PHT): 5.66 cm    SHUNTS MV Decel Time: 134 msec    Systemic VTI: 0.16 m MV E velocity: 69.40 cm/s MV A velocity: 99.00 cm/s MV E/A ratio:  0.70 Adrian Prows MD Electronically signed by Adrian Prows MD Signature Date/Time: 10/12/2020/9:54:23 PM    Final      Medications:     Scheduled Medications: . aspirin  81 mg Oral Daily  . chlorhexidine  15 mL Mouth Rinse BID  . Chlorhexidine Gluconate Cloth  6 each Topical Daily  . clopidogrel  75 mg Oral Daily  . enoxaparin (LOVENOX) injection  70 mg Subcutaneous Q24H  . furosemide  80 mg Intravenous BID  . insulin aspart  0-9 Units Subcutaneous Q4H  . insulin detemir  35 Units Subcutaneous BID  . mouth rinse  15 mL Mouth Rinse q12n4p  . pantoprazole  40 mg Oral QHS  . polyethylene glycol  17 g Oral Daily  . potassium chloride  40 mEq Oral Q6H  .  rosuvastatin  20 mg Per Tube Daily  . sodium chloride flush  10-40 mL Intracatheter Q12H  . sodium chloride flush  3 mL Intravenous Q12H    Infusions: . sodium chloride Stopped (10/13/20 0847)  . amiodarone 30 mg/hr (10/13/20 0900)  . feeding supplement (VITAL AF 1.2 CAL) Stopped (10/12/20 1100)    PRN Medications: sodium chloride, acetaminophen, ondansetron (ZOFRAN) IV, phenol, pneumococcal 23 valent vaccine, sodium chloride flush, sodium chloride flush   Assessment/Plan:   1. Acute on chronic heart failure - Echo 07/2016: EF 40%, grade I DD, inferior/lateral hypokinesis - cath 10/11/20 EF 15%.  - Echo 10/12/20 EF 20-25% RV normal - co-ox 75% off NE - Diuresing well but CVP still elevated. Continue IV diuresis one more day - Start low-dose carvedilol - Hold ACEI/ARNI in the setting of shock and AKI. Hopefully can start soon - I have reviewed echo and cath films with [facility] and Dr, Einar Gip. He is not a candidate for surgical or percutaneous intervention. I think the best plan is to stabilize with medical therapy and see if we can get him to VAD over the next few weeks to months. Has fairly significant PAD but says he can walk 2 miles at baseline without claudication. Hopefully renal function will continue to improve. Will ask VAD team to see him tomorrow.   2. STEMI/CAD -EKG showing new LBBB with pulmonary edema taken to cath where was found to have severe 3V disease unable to revascularize. Suspect distal RCA is culprit lesion but hs trop > 27K --Cath 07/2016: EF 45-50%, mid LAD occluded w/ collaterals, D1 30% & D2 80% stenosis, dominant RCA 30% stenosis, patent LIMA and RIMA. No targets for revascularized and decided on medical management.  -Cath 10/11/2020: EF 15-20%, LVEDP 29, severe LAD disease w/ collaterals form D1 and small diagonal branches, CX moderate size w/ OM1 occluded at ostium, RCA mod disease in proximal and mid segment subtotall distall No targets for revascularization.  - no  options for revascularization - can stop heparin.  - ASA/statin. Will need to stop Plavix if felt to be VAD canddiate at any point - CR to see  3. Acute respiratory failure -Remains intubated - Mental status prevents wean currently -Management per CCM  4. AKI on CKD with hyperkalemia - Presentation Cr 1.83 w/ hyperkalemia - Baseline 1.2-1.4 - Creatinine  2.2 -> 1.9 Suspect ATN - Continue to follow  5. DM uncontrolled -hx of DM foot wound, s/p amputation 3 toes -A1c 8.0% (down from 8.8% Aug 2021) -SSI and insulin gtt - Start Jardiance  6. PAD s/p R SFA occlusion requiring DES  -Successful PTA and stenting of right SFA, prolonged procedure, difficult procedure with use of multiple balloons, guidewires and catheters. 100% stenosis reduced to 0% with implantation of 3 overlapping 6.0 x 120 x2; 6.0 x 100 mm Eluvia DES. -continue to monitor while on pressors  7. Morbid obesity - Body mass index is 44.6 kg/m.   Length of Stay: 2   Glori Bickers MD 10/13/2020, 9:18 AM  Advanced Heart Failure Team Pager [phone] (M-F; [place])  Please contact [facility] for night-coverage after hours (4p -7a ) and weekends on [email or web address]

## 2020-10-13 NOTE — Plan of Care (Signed)
  Problem: Education: Goal: Knowledge of General Education information will improve Description Including pain rating scale, medication(s)/side effects and non-pharmacologic comfort measures Outcome: Progressing   

## 2020-10-13 NOTE — Progress Notes (Signed)
Mobility Specialist: Progress Note   10/13/20 1619  Mobility  Activity Ambulated in hall  Level of Assistance Independent  Assistive Device None  Distance Ambulated (ft) 410 ft  Mobility Response Tolerated well  Mobility performed by Mobility specialist  Bed Position Chair  $Mobility charge 1 Mobility   Pt stopped to take one standing rest break lasting only a few seconds. Pt said he just wanted to make sure he had enough energy to make it back to the room. Pt back to chair per request.   Harrell Gave Corlette Ciano Mobility Specialist

## 2020-10-13 NOTE — Progress Notes (Signed)
[facility] Progress Note Patient Name: Eduardo [name] DOB: Jul 12, 1956 MRN: [id number]   Date of Service  10/13/2020  HPI/Events of Note  K low, Cr 1.9. off of insuline gtt.  eICU Interventions  Klor con 40 meq oral once ordered.     Intervention Category Intermediate Interventions: Electrolyte abnormality - evaluation and management  Elmer Sow 10/13/2020, 5:25 AM

## 2020-10-13 NOTE — Progress Notes (Signed)
Daily Progress Note   Patient Name: Eduardo [name]       Date: 10/13/2020 DOB: 25-May-1956  Age: 64 y.o. MRN#: [id number] Attending Physician: Adrian Prows, MD Primary Care Physician: Rita Ohara, MD Admit Date: 10/11/2020  Reason for Consultation/Follow-up:  To discuss complex medical decision making related to patient's goals of care  Subjective: Spoke with Eduardo [name] at bedside.   Eduardo [name] expresses how happy he is to be here and how appreciative he is of the care he has received. We talk in general terms about his preferences.  His bias is against heroic life prolonging measures, feeding tubes, life support.  He explained that Dr. Linna Hoff offered him two options. (1) keep doing what we are doing - conservative management or (2) a heart pump.  Eduardo [name] is respectfully cautious of the heart pump.    He expressed the desire to update his HCPOA.  I gathered paperwork for both him and Eduardo [name].  We reviewed HCPOA, Living Will and MOST documentation. Eduardo [name] supported Eduardo [name]'s views on avoiding excessive prolongation of life via artificial means and prevention of suffering.  I will revisit Eduardo [name] and Eduardo [name] tomorrow if they have completed the paperwork we will ask the chaplain to move forward with notarization.   Assessment: Level headed gentleman with a supportive significant other.  Much improved after significant cardiac event.  Now sitting in chair, eating with clear speech and mentation.  Patient Profile/HPI: 64 y.o. male "Eduardo [name]" with past medical history of type 2 diabetes with metabolic syndrome, peripheral vascular disease, chronic kidney disease, who was admitted on 10/11/2020 with shortness of breath.  He was found to have pulmonary edema and an HS troponin of 27,000.  He was unable to successfully ventilate on his  own and was intubated in the emergency department.  Code STEMI was called and he was immediately taken to the Cath Lab.  He was found to have severe three-vessel disease.  Initial thought was there were no options for revascularization.  EF is 15 to 20%.   (12/4) He is in cardiac ICU on high-dose pressors and propofol.  He remains intubated.    Length of Stay: 2   Vital Signs: BP (!) 145/73 (BP Location: Left Arm)   Pulse 86   Temp 98.4 F (36.9 C) (Oral)   Resp 14  Ht 5\' 10"  (1.778 m)   Wt (!) 141 kg   SpO2 95%   BMI 44.60 kg/m  SpO2: SpO2: 95 % O2 Device: O2 Device: Room Air O2 Flow Rate: O2 Flow Rate (L/min): 2 L/min       Palliative Assessment/Data: 50%     Palliative Care Plan    Recommendations/Plan: Presented Living Will and MOST form for completion.   Will follow up 12/6 to have those signed and notarized.  Code Status:  Full code   Prognosis:  Unable to determine.  He is at high risk for acute decline and death given advanced CAD and morbid obesity/metabolic syndrome.  Discharge Planning: Home with [place] was discussed with patient and S/O  Thank you for allowing the Palliative Medicine Team to assist in the care of this patient.  Total time spent:  60 min.     Greater than 50%  of this time was spent counseling and coordinating care related to the above assessment and plan.  Florentina Jenny, PA-C Palliative Medicine  Please contact Palliative MedicineTeam phone at [phone] for questions and concerns between 7 am - 7 pm.   Please see AMION for individual provider pager numbers.

## 2020-10-13 NOTE — Progress Notes (Signed)
ANTICOAGULATION CONSULT NOTE   Pharmacy Consult for IV Heparin Indication: chest pain/ACS  Patient Measurements: Height: 5\' 10"  (177.8 cm) Weight: (!) 141 kg (310 lb 13.6 oz) IBW/kg (Calculated) : 73 Heparin Dosing Weight: 107 kg  Vital Signs: Temp: 98.2 F (36.8 C) (12/05 0630) Temp Source: Oral (12/05 0630) BP: 137/85 (12/05 0640) Pulse Rate: 85 (12/05 0800)  Labs: Recent Labs    10/11/20 0500 10/11/20 0500 10/11/20 0555 10/11/20 0757 10/11/20 0918 10/11/20 0918 10/11/20 1143 10/11/20 1559 10/12/20 0511 10/12/20 0511 10/12/20 1229 10/12/20 1515 10/12/20 2216 10/13/20 0403  HGB 17.7*   < >  --   --  15.6   < >  --   --  16.0  --   --   --   --  13.9  HCT 56.5*   < >  --   --  46.0  --   --   --  49.0  --   --   --   --  42.5  PLT 229  --   --   --   --   --   --   --  176  --   --   --   --  PLATELET CLUMPS NOTED ON SMEAR, UNABLE TO ESTIMATE  HEPARINUNFRC  --   --   --   --   --   --   --   --  0.17*   < >  --  0.23* 0.37 0.61  CREATININE  --   --    < > 1.90*  --    < >   < >  --  2.26*  --  2.21*  --   --  1.90*  TROPONINIHS 97*  --   --  1,693*  --   --   --  >27,000*  --   --   --   --   --   --    < > = values in this interval not displayed.    Estimated Creatinine Clearance: 54.9 mL/min (A) (by C-G formula based on SCr of 1.9 mg/dL (H)).  Assessment: 64 years of age male with significant cardiac history including three vessel disease but no targets for CABG admitted due to breathing difficulty and concern for pulmonary edema and code STEMI called.   Patient s/p L-heart cath this AM showing acute on chronic heart failure and severe LAD disease with collaterals OM1 occlusion, and RCA with moderate disease. No targets for revascularization.   Per discussion with MD this AM, okay to stop IV Heparin, no further plans for intervention.  Goal of Therapy:  Heparin level 0.3-0.7 units/ml Monitor platelets by anticoagulation protocol: Yes   Plan:  D/c IV  heparin. Will add lovenox 0.5 mg/kg (obesity dosing) for VTE prophylaxis.  [place] Crane, Roylene Reason, BCCP Clinical Pharmacist  10/13/2020 8:39 AM   [facility] pharmacy phone numbers are listed on [email or web address]

## 2020-10-13 NOTE — Progress Notes (Signed)
Mobility Specialist: Progress Note   10/13/20 1300  Mobility  Activity Ambulated in hall  Level of Assistance Modified independent, requires aide device or extra time  Assistive Device Front wheel walker  Distance Ambulated (ft) 380 ft  Mobility Response Tolerated well  Mobility performed by Mobility specialist  Bed Position Chair  $Mobility charge 1 Mobility   Pre-Mobility: 107 HR, 96% SpO2 Post-Mobility: 117 HR, 134/78 BP, 99% SpO2  Pt started out with slow steady gait that increased with ambulation. Pt asx during ambulation. I encouraged pt to walk 1-2 more times today.   [facility] Day Mobility Specialist

## 2020-10-13 NOTE — Evaluation (Signed)
Physical Therapy Evaluation/Discharge Patient Details Name: Eduardo [name] MRN: [id number] DOB: 1956-08-22 Today's Date: 10/13/2020   History of Present Illness  63 yo admitted 12/3 with acute pulmonary edema, Left BBB and STEMI with cardiogenic shock. Intubated 12/3-12/4. Urgent cath lab 12/3. 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 Pt reports no SOB with mobilization and was slightly unsteady without RW secondary to neuropathy, but states that he wears shoes and orthotics at baseline and is more steady with this. Pt educated on the importance of endurance exercise and the effects that it can have on his cardiovascular system. Pt doesn't need acute skilled PT as he is at his baseline and his vitals with mobility are [facility]. Pt is in agreeance with not needing PT acutely or at d/c. SpO2 standing: 97%  Vitals after 150 ft: 95% 110 bpm  Vitals after 300 ft: 91% 120 bpm    Follow Up Recommendations No PT follow up    Equipment Recommendations  None recommended by PT    Recommendations for Other Services       Precautions / Restrictions Precautions Precautions: Fall Restrictions Weight Bearing Restrictions: No      Mobility  Bed Mobility               General bed mobility comments: pt sitting EOB on arrival states he has to adjust HOB to be able to get out    Transfers Overall transfer level: Modified independent               General transfer comment: reaching for environmental support to rise to standing  Ambulation/Gait Ambulation/Gait assistance: Supervision Gait Distance (Feet): 300 Feet Assistive device: Rolling walker (2 wheeled) Gait Pattern/deviations: Step-through pattern;Decreased stride length   Gait velocity interpretation: >2.62 ft/sec, indicative of community ambulatory General Gait Details: pt walked  9' with RW and last 25' without RW or LOB  Stairs            Wheelchair Mobility    Modified Rankin (Stroke Patients Only)       Balance Overall balance assessment: Mild deficits observed, not formally tested                                           Pertinent Vitals/Pain Pain Assessment: No/denies pain    Home Living Family/patient expects to be discharged to:: Private residence Living Arrangements: Alone Available Help at Discharge: Available 24 hours/day (girlfriend stacy) Type of Home: House Home Access: Stairs to enter   Technical brewer of Steps: 1 Home Layout: One level Home Equipment: None      Prior Function Level of Independence: Independent               Hand Dominance        Extremity/Trunk Assessment   Upper Extremity Assessment Upper Extremity Assessment: Overall WFL for tasks assessed    Lower Extremity Assessment Lower Extremity Assessment: Overall WFL for tasks assessed    Cervical / Trunk Assessment Cervical / Trunk Assessment: Normal  Communication   Communication: No difficulties  Cognition Arousal/Alertness: Awake/alert Behavior During Therapy: WFL for tasks assessed/performed Overall Cognitive Status: Within Functional Limits for tasks assessed  General Comments      Exercises     Assessment/Plan    PT Assessment Patent does not need any further PT services  PT Problem List         PT Treatment Interventions      PT Goals (Current goals can be found in the Care Plan section)  Acute Rehab PT Goals Patient Stated Goal: return home with my girlfriend, fly fish PT Goal Formulation: With patient Time For Goal Achievement: 10/27/20 Potential to Achieve Goals: Good    Frequency     Barriers to discharge        Co-evaluation               AM-PAC PT "6 Clicks" Mobility  Outcome Measure Help needed turning from your back  to your side while in a flat bed without using bedrails?: A Little Help needed moving from lying on your back to sitting on the side of a flat bed without using bedrails?: A Little Help needed moving to and from a bed to a chair (including a wheelchair)?: A Little Help needed standing up from a chair using your arms (e.g., wheelchair or bedside chair)?: A Little Help needed to walk in hospital room?: A Little Help needed climbing 3-5 steps with a railing? : A Little 6 Click Score: 18    End of Session   Activity Tolerance: Patient tolerated treatment well Patient left: in chair;with call bell/phone within reach Nurse Communication: Mobility status PT Visit Diagnosis: Difficulty in walking, not elsewhere classified (R26.2)    Time: 7616-0737 PT Time Calculation (min) (ACUTE ONLY): 15 min   Charges:   PT Evaluation $PT Eval Low Complexity: 1 Low          Caleb Popp, SPT [id number]  Atul Delucia 10/13/2020, 1:42 PM

## 2020-10-13 NOTE — Progress Notes (Signed)
[place] Progress Note Patient Name: Eduardo [name] DOB: 1956/04/05 MRN: [id number]   Date of Service  10/13/2020  HPI/Events of Note  Asking for med for throat pain.  eICU Interventions  Chloraseptic spray ordered prn.      Intervention Category Minor Interventions: Other:  Elmer Sow 10/13/2020, 4:04 AM

## 2020-10-14 DIAGNOSIS — I5023 Acute on chronic systolic (congestive) heart failure: Secondary | ICD-10-CM | POA: Diagnosis not present

## 2020-10-14 DIAGNOSIS — Z66 Do not resuscitate: Secondary | ICD-10-CM

## 2020-10-14 LAB — CBC
HCT: 42.7 % (ref 39.0–52.0)
Hemoglobin: 14.8 g/dL (ref 13.0–17.0)
MCH: 31.3 pg (ref 26.0–34.0)
MCHC: 34.7 g/dL (ref 30.0–36.0)
MCV: 90.3 fL (ref 80.0–100.0)
Platelets: 132 10*3/uL — ABNORMAL LOW (ref 150–400)
RBC: 4.73 MIL/uL (ref 4.22–5.81)
RDW: 12.8 % (ref 11.5–15.5)
WBC: 9 10*3/uL (ref 4.0–10.5)
nRBC: 0 % (ref 0.0–0.2)

## 2020-10-14 LAB — BASIC METABOLIC PANEL
Anion gap: 14 (ref 5–15)
BUN: 30 mg/dL — ABNORMAL HIGH (ref 8–23)
CO2: 27 mmol/L (ref 22–32)
Calcium: 8.6 mg/dL — ABNORMAL LOW (ref 8.9–10.3)
Chloride: 100 mmol/L (ref 98–111)
Creatinine, Ser: 1.89 mg/dL — ABNORMAL HIGH (ref 0.61–1.24)
GFR, Estimated: 39 mL/min — ABNORMAL LOW (ref >60)
Glucose, Bld: 98 mg/dL (ref 70–99)
Potassium: 3.3 mmol/L — ABNORMAL LOW (ref 3.5–5.1)
Sodium: 141 mmol/L (ref 135–145)

## 2020-10-14 LAB — GLUCOSE, CAPILLARY
Glucose-Capillary: 106 mg/dL — ABNORMAL HIGH (ref 70–99)
Glucose-Capillary: 114 mg/dL — ABNORMAL HIGH (ref 70–99)
Glucose-Capillary: 137 mg/dL — ABNORMAL HIGH (ref 70–99)
Glucose-Capillary: 185 mg/dL — ABNORMAL HIGH (ref 70–99)
Glucose-Capillary: 236 mg/dL — ABNORMAL HIGH (ref 70–99)
Glucose-Capillary: 253 mg/dL — ABNORMAL HIGH (ref 70–99)
Glucose-Capillary: 307 mg/dL — ABNORMAL HIGH (ref 70–99)

## 2020-10-14 LAB — COOXEMETRY PANEL
Carboxyhemoglobin: 1.4 % (ref 0.5–1.5)
Methemoglobin: 1 % (ref 0.0–1.5)
O2 Saturation: 55 %
Total hemoglobin: 14.7 g/dL (ref 12.0–16.0)

## 2020-10-14 MED ORDER — ADULT MULTIVITAMIN W/MINERALS CH
1.0000 | ORAL_TABLET | Freq: Every day | ORAL | Status: DC
  Administered 2020-10-15: 1 via ORAL
  Filled 2020-10-14: qty 1

## 2020-10-14 MED ORDER — LOSARTAN POTASSIUM 25 MG PO TABS
25.0000 mg | ORAL_TABLET | Freq: Every day | ORAL | Status: DC
  Administered 2020-10-14 – 2020-10-15 (×2): 25 mg via ORAL
  Filled 2020-10-14 (×2): qty 1

## 2020-10-14 MED ORDER — ROSUVASTATIN CALCIUM 20 MG PO TABS
20.0000 mg | ORAL_TABLET | Freq: Every day | ORAL | Status: DC
  Administered 2020-10-14 – 2020-10-15 (×2): 20 mg via ORAL
  Filled 2020-10-14 (×2): qty 1

## 2020-10-14 MED ORDER — ENSURE MAX PROTEIN PO LIQD
11.0000 [oz_av] | Freq: Every day | ORAL | Status: DC
  Administered 2020-10-14: 11 [oz_av] via ORAL
  Filled 2020-10-14: qty 330

## 2020-10-14 MED ORDER — CARVEDILOL 6.25 MG PO TABS
6.2500 mg | ORAL_TABLET | Freq: Two times a day (BID) | ORAL | Status: DC
  Administered 2020-10-14 – 2020-10-15 (×2): 6.25 mg via ORAL
  Filled 2020-10-14 (×2): qty 1

## 2020-10-14 MED ORDER — POTASSIUM CHLORIDE CRYS ER 20 MEQ PO TBCR
40.0000 meq | EXTENDED_RELEASE_TABLET | Freq: Once | ORAL | Status: AC
  Administered 2020-10-14: 40 meq via ORAL
  Filled 2020-10-14: qty 2

## 2020-10-14 NOTE — Progress Notes (Signed)
Mobility Specialist - Progress Note   10/14/20 1152  Mobility  Activity Ambulated in hall  Level of Assistance Standby assist, set-up cues, supervision of patient - no hands on  Assistive Device None  Distance Ambulated (ft) 355 ft  Mobility Response Tolerated well  Mobility performed by Mobility specialist  $Mobility charge 1 Mobility    Pre-mobility: 97 HR During mobility: 107 HR Post-mobility: 96 HR  Asx throughout ambulation. Pt self-limited distance to ensure proper energy conservation. Pt back in bed after walk.   Pricilla Handler Mobility Specialist Mobility Specialist Phone: [phone]

## 2020-10-14 NOTE — Care Management Important Message (Signed)
Important Message  Patient Details  Name: Eduardo [name] MRN: [id number] Date of Birth: 1956-10-11   Medicare Important Message Given:  Yes     Shelda Altes 10/14/2020, 9:30 AM

## 2020-10-14 NOTE — Progress Notes (Signed)
Nutrition Follow-up  DOCUMENTATION CODES:   Obesity unspecified  INTERVENTION:   -Ensure Max po daily, each supplement provides 150 kcal and 30 grams of protein -Magic cup BID with meals, each supplement provides 290 kcal and 9 grams of protein -MVI with minerals daily  NUTRITION DIAGNOSIS:   Increased nutrient needs related to acute illness as evidenced by estimated needs.  Ongoing  GOAL:   Patient will meet greater than or equal to 90% of their needs  Progressing   MONITOR:   PO intake, Supplement acceptance, Diet advancement, Labs, Weight trends, Skin, I & O's  REASON FOR ASSESSMENT:   Consult Enteral/tube feeding initiation and management  ASSESSMENT:   Patient with PMH significant for CKD, DM, PVD, prior amputation 5th R toe, and CVD. Presents this admission with STEMI and pulmonary edema.  12/4- extubated   Reviewed I/O's: -752 ml x 24 hours and -3.6 L since admission  UOP: 1.6 L x 24 hours  Pt unavailable at time of attempted contact.   Per MD notes, plan for VAD evaluation.   Per meal completions records, noted meal completion 100%.  Pt with increased nutritional needs secondary to acute illness; pt would benefit from addition of oral nutrition supplements.    Medications reviewed and include lasix and miralax.   Labs reviewed: K: 3.3, CBGS: 106-114 (inpatient orders for glycemic control are 10 mg empagliflozin daily, 0-9 units insulin aspart every 4 hours, and 35 units insulin detemir BID).   Diet Order:   Diet Order            Diet Carb Modified Fluid consistency: Thin; Room service appropriate? Yes  Diet effective now                 EDUCATION NEEDS:   Not appropriate for education at this time  Skin:  Skin Assessment: Reviewed RN Assessment  Last BM:  10/13/20  Height:   Ht Readings from Last 1 Encounters:  10/11/20 5\' 10"  (1.778 m)    Weight:   Wt Readings from Last 1 Encounters:  10/14/20 (!) 139.5 kg   BMI:  Body mass  index is 44.12 kg/m.  Estimated Nutritional Needs:   Kcal:  2050-2250  Protein:  110-125 grams  Fluid:  > 2 L    Loistine Chance, RD, LDN, [name] Registered Dietitian II Certified Diabetes Care and Education Specialist Please refer to [facility] for RD and/or RD on-call/weekend/after hours pager

## 2020-10-14 NOTE — Evaluation (Signed)
Occupational Therapy Evaluation Patient Details Name: Eduardo [name] MRN: [id number] DOB: 1956-11-07 Today's Date: 10/14/2020    History of Present Illness 64 yo admitted 12/3 with acute pulmonary edema, Left BBB and STEMI with cardiogenic shock. Intubated 12/3-12/4. Urgent cath lab 12/3. 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 Pt performing ADLs and functional mobility with Mod I level with increased time as needed. Discussed general energy conservation techniques including use of shower seat and taking rest breaks. Answered all pt questions. Recommend dc home once medically stable per physician. All acute OT needs met and will sign off. Thank you.    Follow Up Recommendations  No OT follow up    Equipment Recommendations  None recommended by OT    Recommendations for Other Services       Precautions / Restrictions Precautions Precautions: Fall      Mobility Bed Mobility               General bed mobility comments: Pt in recliner upon arrival    Transfers Overall transfer level: Modified independent               General transfer comment: Increased time    Balance Overall balance assessment: Mild deficits observed, not formally tested                                         ADL either performed or assessed with clinical judgement   ADL Overall ADL's : Modified independent                                       General ADL Comments: Pt performing ADLs and functional mobility at Mod I level with increased time. Discussed energy conservation techniques including use of shower seat, taking breaks, and being aware of fatigue level.      Vision Patient Visual Report: No change from baseline       Perception     Praxis      Pertinent Vitals/Pain Pain Assessment:  No/denies pain     Hand Dominance     Extremity/Trunk Assessment Upper Extremity Assessment Upper Extremity Assessment: Overall WFL for tasks assessed   Lower Extremity Assessment Lower Extremity Assessment: Overall WFL for tasks assessed   Cervical / Trunk Assessment Cervical / Trunk Assessment: Normal   Communication Communication Communication: No difficulties   Cognition Arousal/Alertness: Awake/alert Behavior During Therapy: WFL for tasks assessed/performed Overall Cognitive Status: Within Functional Limits for tasks assessed                                     General Comments  HR 101    Exercises     Shoulder Instructions      Home Living Family/patient expects to be discharged to:: Private residence Living Arrangements: Alone Available Help at Discharge: Available 24 hours/day (girlfriend stacy) Type of Home: House Home Access: Stairs to enter [employer] of Steps: 1   Home Layout: One level     Bathroom Shower/Tub: Walk-in shower;Tub/shower unit   Bathroom Toilet: Handicapped height Bathroom Accessibility: Yes  Home Equipment: [place] - 2 wheels;Walker - 4 wheels;Shower seat          Prior Functioning/Environment Level of Independence: Independent        Comments: Works full time and retiring on the 14th        OT Problem List: Decreased activity tolerance;Impaired balance (sitting and/or standing);Decreased knowledge of precautions      OT Treatment/Interventions:      OT Goals(Current goals can be found in the care plan section) Acute Rehab OT Goals Patient Stated Goal: return home with my girlfriend, fly fish OT Goal Formulation: All assessment and education complete, DC therapy  OT Frequency:     Barriers to D/C:            Co-evaluation              AM-PAC OT "6 Clicks" Daily Activity     Outcome Measure Help from another person eating meals?: None Help from another person taking care of  personal grooming?: None Help from another person toileting, which includes using toliet, bedpan, or urinal?: None Help from another person bathing (including washing, rinsing, drying)?: None Help from another person to put on and taking off regular upper body clothing?: None Help from another person to put on and taking off regular lower body clothing?: None 6 Click Score: 24   End of Session Nurse Communication: Mobility status  Activity Tolerance: Patient tolerated treatment well Patient left: in chair;with call bell/phone within reach  OT Visit Diagnosis: Unsteadiness on feet (R26.81);Other abnormalities of gait and mobility (R26.89);Muscle weakness (generalized) (M62.81)                Time: 2903-7955 OT Time Calculation (min): 19 min Charges:  OT General Charges $OT Visit: 1 Visit OT Evaluation $OT Eval Low Complexity: [place], OTR/L Acute Rehab Pager: [phone] Office: [place] 10/14/2020, 8:06 AM

## 2020-10-14 NOTE — Progress Notes (Signed)
Inpatient Diabetes Program Recommendations  AACE/ADA: New Consensus Statement on Inpatient Glycemic Control   Target Ranges:  Prepandial:   less than 140 mg/dL      Peak postprandial:   less than 180 mg/dL (1-2 hours)      Critically ill patients:  140 - 180 mg/dL   Results for LEOR, WHYTE" (MRN [id number]) as of 10/14/2020 11:02  Ref. Range 10/13/2020 06:07 10/13/2020 11:17 10/13/2020 17:47 10/13/2020 20:36 10/14/2020 00:12 10/14/2020 04:08 10/14/2020 08:12  Glucose-Capillary Latest Ref Range: 70 - 99 mg/dL 171 (H) 231 (H) 218 (H) 259 (H) 137 (H) 114 (H) 106 (H)   Review of Glycemic Control  Current orders for Inpatient glycemic control: Levemir 35 units BID, Novolog 0-9 units Q4H, Jardiance 10 mg daily  Inpatient Diabetes Program Recommendations:    Insulin: Please consider ordering Novolog 3 units TID with meals for meal coverage if patient eats at least 50% of meals.  Thanks, Barnie Alderman, RN, MSN, CDE Diabetes Coordinator Inpatient Diabetes Program [phone] (Team Pager from 8am to 5pm)

## 2020-10-14 NOTE — Progress Notes (Signed)
[facility] EDUCATION NOTE:                Initial VAD teaching completed with patient.   VAD educational packet including "Understanding Your Options with Advanced Heart Failure", "[employer] Patient Agreement for VAD Evaluation and Potential Implantation" consent, and [employer] "Living a More Active Life" HM III booklet", "[place] HM III Patient Education", "[place] Mechanical Circulatory Support Program", and "Decision Aids for Left Ventricular Assist Device" reviewed in detail and left at bedside for continued reference.  All questions answered regarding VAD implant, hospital stay, and what to expect when discharged home living with a heart pump. Pt identified Eduardo [name] (girlfriend) as his primary caregiver should be deemed appropriate for LVAD.  Explained need for 24/7 care when pt is discharged home due to sternal precautions, adaptation to living on support, emotional support, consistent and meticulous exit site care and management, medication adherence and high volume of follow up visits with the [facility] after discharge; both pt and caregiver verbalized understanding of above.   Explained that LVAD can be implanted for two indications in the setting of advanced left ventricular heart failure treatment:  1. Bridge to transplant - used for patients who cannot safely wait for heart transplant without this device.  Or    2. Destination therapy - used for patients until end of life or recovery of heart function.  Patient understands that the indication at this point in time for LVAD therapy would be for Destination Therapy due to BMI.    Reviewed and supplied a copy of home inspection check list stressing that only three pronged grounded power outlets can be used for VAD equipment. [name] confirmed home has electrical outlets that will support the equipment along with access working telephone.  Identified the following lifestyle modifications while living on MCS:    1. No driving for at  least 6 weeks and then only if doctor gives permission to do so.   2. No tub baths while pump implanted, and shower only when doctor gives permission.   3. No swimming or submersion in water while implanted with pump.   4. No contact sports or engaging in jumping activities.   5. Always have a backup controller, charged spare batteries, and battery clips nearby at all times in case of emergency.   6. Exit site care including dressing changes, monitoring for infection, and importance of keeping percutaneous lead stabilized at all times.    Reinforced need for 24 hour/7 day week caregivers; pt designated [name] as caregivers. He will also need to abide by sternal precautions with no lifting >10lbs, pushing, pulling and will need assistance with adapting to new life style with VAD equipment and care.   Intermacs patient survival statistics through June 2021 reviewed with patient and caregiver as follows:                                       The patient understands that from this discussion it does not mean that they will receive the device, but that depends on an extensive evaluation process.   Patient reports the possible need for LVAD is "a little too soon right now". He feels if his medications can be adjusted and he is "better" with diet and other things within his control, he will not need LVAD. He has read a "little" about the device and is very concerned about wearing batteries  every day. He says he is going home later today and "hopes" he will continue to improve and not have to think about LVAD. Encouraged patient to follow up with Dr. Haroldine Laws for close monitoring of heart failure status. Explained Dr. Haroldine Laws will counsel him re: need/timing of LVAD. Pt verbalized agreement to same.   Zada Girt, RN VAD Coordinator   Office: [phone] 24/7 VAD Pager: [phone]

## 2020-10-14 NOTE — Progress Notes (Signed)
Subjective:  Feels much better this morning, extubated last afternoon.  States that he did not sleep well last night due to the hospital bed.  Very antsy to go home.  He is being evaluated for LVAD placement as well by heart failure team.  Intake/Output from previous day:  I/O last 3 completed shifts: In: 2064.9 [P.O.:1440; I.V.:624.9] Out: 4080 [Urine:4080] Total I/O In: -  Out: 800 [Urine:800]  Blood pressure (!) 97/51, pulse 82, temperature 98.3 F (36.8 C), temperature source Oral, resp. rate 16, height _0  (1.778 m), weight (!) 139.5 kg, SpO2 98 %.  Vitals with BMI 10/14/2020 10/14/2020 10/14/2020  Height - - -  Weight - - -  BMI - - -  Systolic 97 673 419  Diastolic 51 71 59  Pulse 82 84 -     Physical Exam Constitutional:      Appearance: He is obese.  HENT:     Head: Atraumatic.     Mouth/Throat:     Mouth: Mucous membranes are moist.  Eyes:     Extraocular Movements: Extraocular movements intact.  Cardiovascular:     Rate and Rhythm: Normal rate.     Pulses:          Carotid pulses are 2+ on the right side and 2+ on the left side.      Popliteal pulses are 0 on the right side and 0 on the left side.       Dorsalis pedis pulses are 0 on the right side and 0 on the left side.       Posterior tibial pulses are 0 on the right side and 0 on the left side.     Heart sounds: Heart sounds are distant. No gallop.   Pulmonary:     Effort: Pulmonary effort is normal.     Breath sounds: Normal breath sounds.  Abdominal:     General: Abdomen is flat. Bowel sounds are normal.     Palpations: Abdomen is soft.     Comments: Obese, pannus present  Musculoskeletal:        General: No swelling. Normal range of motion.     Cervical back: Normal range of motion and neck supple.  Skin:    Capillary Refill: Capillary refill takes less than 2 seconds.  Neurological:     General: No focal deficit present.     Mental Status: He is alert and oriented to person, place, and time.    Psychiatric:        Mood and Affect: Mood normal.        Behavior: Behavior normal.     Lab Results: BMP BNP (last 3 results) Recent Labs    10/11/20 0500  BNP 575.7*    ProBNP (last 3 results) No results for input(s): PROBNP in the last 8760 hours. BMP Latest Ref Rng & Units 10/14/2020 10/13/2020 10/12/2020  Glucose 70 - 99 mg/dL 98 190(H) 219(H)  BUN 8 - 23 mg/dL 30(H) 35(H) 43(H)  Creatinine 0.61 - 1.24 mg/dL 1.89(H) 1.90(H) 2.21(H)  BUN/Creat Ratio 10 - 24 - - -  Sodium 135 - 145 mmol/L 141 138 141  Potassium 3.5 - 5.1 mmol/L 3.3(L) 3.2(L) 3.4(L)  Chloride 98 - 111 mmol/L 100 101 107  CO2 22 - 32 mmol/L _1 Calcium 8.9 - 10.3 mg/dL 8.6(L) 8.0(L) 8.4(L)   Hepatic Function Latest Ref Rng & Units 10/11/2020 04/21/2019 09/28/2011  Total Protein 6.5 - 8.1 g/dL 6.2(L) 5.8(L) 6.5  Albumin 3.5 -  5.0 g/dL 3.4(L) 3.8 4.1  AST 15 - 41 U/L 124(H) 21 13  ALT 0 - 44 U/L 35 20 11  Alk Phosphatase 38 - 126 U/L 65 68 90  Total Bilirubin 0.3 - 1.2 mg/dL 0.8 0.4 0.3   CBC Latest Ref Rng & Units 10/14/2020 10/13/2020 10/12/2020  WBC 4.0 - 10.5 K/uL 9.0 11.3(H) 16.3(H)  Hemoglobin 13.0 - 17.0 g/dL 14.8 13.9 16.0  Hematocrit 39 - 52 % 42.7 42.5 49.0  Platelets 150 - 400 K/uL 132(L) PLATELET CLUMPS NOTED ON SMEAR, UNABLE TO ESTIMATE 176   Lipid Panel     Component Value Date/Time   CHOL 137 06/24/2020 0937   TRIG 144 10/12/2020 0511   HDL 33 (L) 06/24/2020 0937   CHOLHDL 4.2 06/24/2020 0937   CHOLHDL 4.4 09/28/2011 1358   VLDL 21 09/28/2011 1358   LDLCALC 72 06/24/2020 0937   Cardiac Panel (last 3 results) No results for input(s): CKTOTAL, CKMB, TROPONINI, RELINDX in the last 72 hours.  HEMOGLOBIN A1C Lab Results  Component Value Date   HGBA1C 8.0 (H) 10/11/2020   MPG 182.9 10/11/2020   TSH Recent Labs    06/24/20 0937  TSH 1.650   Imaging:  DG CHEST PORT 1 VIEW  Result Date: 10/12/2020 CLINICAL DATA:  Respiratory distress.  Follow-up exam. EXAM: PORTABLE CHEST 1  VIEW COMPARISON:  10/11/2020 FINDINGS: Vascular congestion with bilateral lower lung zone airspace opacities are without change. Upper lungs are clear. Suspect small effusions.  No pneumothorax. Endotracheal tube, right internal jugular central venous line and nasal/orogastric tube are stable. IMPRESSION: 1. No change from the previous day's exam. 2. Persistent vascular congestion and lower lung zone airspace opacities, the latter finding likely due to a combination of pleural fluid with atelectasis, with a possible component of either edema or infection. Electronically Signed   By: Lajean Manes M.D.   On: 10/12/2020 09:56   Cardiac Studies:  Echocardiogram 10/12/2020:  1. Not well visualized, LVH degree unable to be assessed due to poor echo window. . Left ventricular ejection fraction, by estimation, is 20 to 25%. The left ventricle has severely decreased function. The left ventricle demonstrates regional wall motion  abnormalities (see scoring diagram/findings for description). Left ventricular diastolic parameters are consistent with Grade I diastolic dysfunction (impaired relaxation).  2. Right ventricular systolic function is normal. The right ventricular size is normal.  3. The mitral valve is normal in structure. Trivial mitral valve regurgitation. No evidence of mitral stenosis.  4. The aortic valve is normal in structure. Aortic valve regurgitation is not visualized. No aortic stenosis is present.  5. The inferior vena cava is dilated in size with <50% respiratory variability, suggesting right atrial pressure of 15 mmHg.  EKG:  EKG 04/19/2020: Normal sinus rhythm at rate of 61 bpm, inferior infarct old. Anteroseptal infarct old. Nonspecific T abnormality. Borderline low voltage complexes.  EKG 08/11/2020: Sinus tachycardia at rate of 120 bpm, atypical left bundle branch block.  No further analysis.  EKG 09/13/2020: Normal sinus rhythm at rate of 91 bpm, left atrial enlargement, inferior  infarct old.  Anteroseptal infarct old.  Nonspecific T abnormality.  Compared to 08/11/2020, left bundle branch block not present.  Scheduled Meds: . aspirin  81 mg Oral Daily  . carvedilol  6.25 mg Oral BID WC  . chlorhexidine  15 mL Mouth Rinse BID  . Chlorhexidine Gluconate Cloth  6 each Topical Daily  . clopidogrel  75 mg Oral Daily  . empagliflozin  10 mg Oral  Daily  . insulin aspart  0-9 Units Subcutaneous Q4H  . insulin detemir  35 Units Subcutaneous BID  . losartan  25 mg Oral Daily  . mouth rinse  15 mL Mouth Rinse q12n4p  . multivitamin with minerals  1 tablet Oral Daily  . pantoprazole  40 mg Oral QHS  . polyethylene glycol  17 g Oral Daily  . Ensure Max Protein  11 oz Oral QHS  . rosuvastatin  20 mg Oral Daily  . sodium chloride flush  10-40 mL Intracatheter Q12H  . sodium chloride flush  3 mL Intravenous Q12H   Continuous Infusions: . sodium chloride Stopped (10/13/20 0847)   PRN Meds:.sodium chloride, acetaminophen, ondansetron (ZOFRAN) IV, phenol, pneumococcal 23 valent vaccine, sodium chloride flush, sodium chloride flush  Assessment/Plan:  1.  STEMI with acute presentation with pulmonary edema and cardiogenic shock 2.  New onset left bundle branch block 3.  Ischemic cardiomyopathy with severe LV systolic dysfunction 4.  Diffuse multivessel coronary artery disease with no targets for revascularization 5.  Diabetes mellitus controlled without hypoglycemia, with stage IIIb CKD.  Recommendation   Patient is now being evaluated for LVAD placement.  Fortunately from cardiac standpoint his lungs are clear and he ambulated in the hallway without any desaturation or any significant symptoms.  He will also need cardiac rehab which we arranged in the outpatient basis.  Left bundle branch block has resolved.  I will start the patient on low-dose of losartan, in view of renal dysfunction with stage III chronic kidney disease, and also patient being on Jardiance, I am skeptical  of starting him on Entresto for now.  Eventually I will switch to [facility].  I will increase carvedilol to 6.25 mg twice daily dose.  He will be discharged home probably tomorrow if remains stable, on telemetry he has not had any significant ventricular arrhythmias.  I will continue amiodarone 200 mg daily upon discharge for 3 months, will repeat echocardiogram for follow-up.  I will consider discontinuing amiodarone, patient is only 64 years of age.  Obesity was again discussed with the patient.    Adrian Prows, MD, [facility] 10/14/2020, [place] PM Office: [phone] Pager: [phone]

## 2020-10-14 NOTE — Progress Notes (Addendum)
Advanced Heart Failure Rounding Note   Subjective:    Co-ox lower today off NE, 55%. Currently disconnected from CVP (ambulating w/ PT)   -1.5L in UOP yesterday. Wt down another 3 lb.   BMP pending.   He reports he feels "fine". No complaints today. Denies CP. No dyspnea.   Echo EF 20-25% RV normal  Objective:   Weight Range:  Vital Signs:   Temp:  [97.8 F (36.6 C)-99.3 F (37.4 C)] 98.8 F (37.1 C) (12/06 0411) Pulse Rate:  [75-113] 75 (12/06 0411) Resp:  [14-20] 17 (12/06 0411) BP: (113-145)/(59-86) 118/86 (12/06 0411) SpO2:  [92 %-95 %] 92 % (12/06 0411) Arterial Line BP: (127)/(45) 127/45 (12/05 0800) Weight:  [139.5 kg] 139.5 kg (12/06 0411) Last BM Date: 10/10/20  Weight change: Filed Weights   10/12/20 0615 10/13/20 0600 10/14/20 0411  Weight: (!) 145.8 kg (!) 141 kg (!) 139.5 kg    Intake/Output:   Intake/Output Summary (Last 24 hours) at 10/14/2020 0744 Last data filed at 10/13/2020 1955 Gross per 24 hour  Intake 803.12 ml  Output 1555 ml  Net -751.88 ml     PHYSICAL EXAM: General:  Well appearing, obese middle aged WM. No respiratory difficulty HEENT: normal Neck: supple. Thick neck, JVD not well visualized. Carotids 2+ bilat; no bruits. No lymphadenopathy or thyromegaly appreciated. Cor: PMI nondisplaced. Regular rate & rhythm. No rubs, gallops or murmurs. Lungs: clear Abdomen: obese, soft, nontender, nondistended. No hepatosplenomegaly. No bruits or masses. Good bowel sounds. Extremities: no cyanosis, clubbing, rash, edema Neuro: alert & oriented x 3, cranial nerves grossly intact. moves all 4 extremities w/o difficulty. Affect pleasant.   Telemetry: NSR 90s, no arrthymias Personally reviewed   Labs: Basic Metabolic Panel: Recent Labs  Lab 10/11/20 1143 10/11/20 1143 10/11/20 1424 10/11/20 1424 10/11/20 2139 10/12/20 0511 10/12/20 1229 10/13/20 0403  NA 136  --  137  --   --  140 141 138  K 6.1*  --  4.6  --   --  3.7 3.4*  3.2*  CL 102  --  106  --   --  106 107 101  CO2 22  --  23  --   --  20* 22 26  GLUCOSE 468*  --  319*  --   --  216* 219* 190*  BUN 36*  --  39*  --   --  43* 43* 35*  CREATININE 2.13*  --  2.21*  --   --  2.26* 2.21* 1.90*  CALCIUM 8.7*   < > 8.6*   < >  --  8.6* 8.4* 8.0*  MG  --   --   --   --  2.5* 2.2  --   --   PHOS  --   --   --   --  2.2* 3.6  --   --    < > = values in this interval not displayed.    Liver Function Tests: Recent Labs  Lab 10/11/20 1143  AST 124*  ALT 35  ALKPHOS 65  BILITOT 0.8  PROT 6.2*  ALBUMIN 3.4*   No results for input(s): LIPASE, AMYLASE in the last 168 hours. No results for input(s): AMMONIA in the last 168 hours.  CBC: Recent Labs  Lab 10/11/20 0500 10/11/20 0918 10/12/20 0511 10/13/20 0403 10/14/20 0540  WBC 17.1*  --  16.3* 11.3* 9.0  NEUTROABS 10.5*  --   --   --   --   HGB 17.7*  15.6 16.0 13.9 14.8  HCT 56.5* 46.0 49.0 42.5 42.7  MCV 95.8  --  92.1 92.2 90.3  PLT 229  --  176 PLATELET CLUMPS NOTED ON SMEAR, UNABLE TO ESTIMATE 132*    Cardiac Enzymes: No results for input(s): CKTOTAL, CKMB, CKMBINDEX, TROPONINI in the last 168 hours.  BNP: BNP (last 3 results) Recent Labs    10/11/20 0500  BNP 575.7*    ProBNP (last 3 results) No results for input(s): PROBNP in the last 8760 hours.    Other results:  Imaging: ECHOCARDIOGRAM COMPLETE  Result Date: 10/12/2020    ECHOCARDIOGRAM REPORT   Patient Name:   Eduardo [name] Date of Exam: 10/12/2020 Medical Rec #:  [id number]    Height:       70.0 in Accession #:    [id number]   Weight:       321.4 lb Date of Birth:  Mar 31, 1956   BSA:          2.554 m Patient Age:    63 years     BP:           136/92 mmHg Patient Gender: M            HR:           77 bpm. Exam Location:  Inpatient Procedure: 2D Echo and Intracardiac Opacification Agent Indications:     acute systolic chf 741.63  History:         Patient has no prior history of Echocardiogram examinations.                  CAD,  chronic kidney disease; Risk Factors:Hypertension,                  Dyslipidemia and Diabetes.  Sonographer:     Johny Chess Referring Phys:  [id number] Sherlon Handing LIGGETT Diagnosing Phys: Adrian Prows MD  Sonographer Comments: Technically difficult study due to poor echo windows. Image acquisition challenging due to patient body habitus. IMPRESSIONS  1. Not well visualized, LVH degree unable to be assessed due to poor echo window. . Left ventricular ejection fraction, by estimation, is 20 to 25%. The left ventricle has severely decreased function. The left ventricle demonstrates regional wall motion  abnormalities (see scoring diagram/findings for description). Left ventricular diastolic parameters are consistent with Grade I diastolic dysfunction (impaired relaxation).  2. Right ventricular systolic function is normal. The right ventricular size is normal.  3. The mitral valve is normal in structure. Trivial mitral valve regurgitation. No evidence of mitral stenosis.  4. The aortic valve is normal in structure. Aortic valve regurgitation is not visualized. No aortic stenosis is present.  5. The inferior vena cava is dilated in size with <50% respiratory variability, suggesting right atrial pressure of 15 mmHg. FINDINGS  Left Ventricle: Not well visualized, LVH degree unable to be assessed due to poor echo window. Left ventricular ejection fraction, by estimation, is 20 to 25%. The left ventricle has severely decreased function. The left ventricle demonstrates regional wall motion abnormalities. Definity contrast agent was given IV to delineate the left ventricular endocardial borders. The left ventricular internal cavity size was normal in size. There is no left ventricular hypertrophy. Left ventricular diastolic parameters are consistent with Grade I diastolic dysfunction (impaired relaxation). Normal left ventricular filling pressure. Right Ventricle: The right ventricular size is normal. No increase in right  ventricular wall thickness. Right ventricular systolic function is normal. Left Atrium: Left atrial size was normal in size. Right  Atrium: Right atrial size was normal in size. Pericardium: There is no evidence of pericardial effusion. Mitral Valve: The mitral valve is normal in structure. Trivial mitral valve regurgitation. No evidence of mitral valve stenosis. Tricuspid Valve: The tricuspid valve is normal in structure. Tricuspid valve regurgitation is not demonstrated. No evidence of tricuspid stenosis. Aortic Valve: The aortic valve is normal in structure. Aortic valve regurgitation is not visualized. No aortic stenosis is present. Pulmonic Valve: The pulmonic valve was not well visualized. Pulmonic valve regurgitation is not visualized. Aorta: The aortic root is normal in size and structure. Venous: The inferior vena cava is dilated in size with less than 50% respiratory variability, suggesting right atrial pressure of 15 mmHg. IAS/Shunts: The interatrial septum was not well visualized.   Diastology LV e' medial:    5.98 cm/s LV E/e' medial:  11.6 LV e' lateral:   10.40 cm/s LV E/e' lateral: 6.7  RIGHT VENTRICLE             IVC RV S prime:     14.30 cm/s  IVC diam: 2.30 cm TAPSE (M-mode): 2.4 cm LEFT ATRIUM             Index       RIGHT ATRIUM           Index LA Vol (A2C):   72.0 ml 28.19 ml/m RA Area:     12.60 cm LA Vol (A4C):   68.1 ml 26.67 ml/m RA Volume:   30.10 ml  11.79 ml/m LA Biplane Vol: 74.4 ml 29.13 ml/m  AORTIC VALVE LVOT Vmax:   84.20 cm/s LVOT Vmean:  55.800 cm/s LVOT VTI:    0.162 m  AORTA Ao Asc diam: 3.40 cm MITRAL VALVE MV Area (PHT): 5.66 cm    SHUNTS MV Decel Time: 134 msec    Systemic VTI: 0.16 m MV E velocity: 69.40 cm/s MV A velocity: 99.00 cm/s MV E/A ratio:  0.70 Adrian Prows MD Electronically signed by Adrian Prows MD Signature Date/Time: 10/12/2020/9:54:23 PM    Final      Medications:     Scheduled Medications: . aspirin  81 mg Oral Daily  . carvedilol  3.125 mg Oral BID WC   . chlorhexidine  15 mL Mouth Rinse BID  . Chlorhexidine Gluconate Cloth  6 each Topical Daily  . clopidogrel  75 mg Oral Daily  . empagliflozin  10 mg Oral Daily  . enoxaparin (LOVENOX) injection  70 mg Subcutaneous Q24H  . furosemide  80 mg Intravenous BID  . insulin aspart  0-9 Units Subcutaneous Q4H  . insulin detemir  35 Units Subcutaneous BID  . mouth rinse  15 mL Mouth Rinse q12n4p  . pantoprazole  40 mg Oral QHS  . polyethylene glycol  17 g Oral Daily  . rosuvastatin  20 mg Oral Daily  . sodium chloride flush  10-40 mL Intracatheter Q12H  . sodium chloride flush  3 mL Intravenous Q12H    Infusions: . sodium chloride Stopped (10/13/20 0847)  . feeding supplement (VITAL AF 1.2 CAL) Stopped (10/12/20 1100)    PRN Medications: sodium chloride, acetaminophen, ondansetron (ZOFRAN) IV, phenol, pneumococcal 23 valent vaccine, sodium chloride flush, sodium chloride flush   Assessment/Plan:   1. Acute on chronic heart failure - Echo 07/2016: EF 40%, grade I DD, inferior/lateral hypokinesis - cath 10/11/20 EF 15%.  - Echo 10/12/20 EF 20-25% RV normal - co-ox marginal at 55% off NE - has diuresed well. Wt continues to trend down. Plan switch  to PO diuretics soon. Check CVP after PT session  - Continue low-dose carvedilol - Hold ACEI/ARNI in the setting of shock and AKI. Hopefully can start soon - I have reviewed echo and cath films with TCTS and Dr, Einar Gip. He is not a candidate for surgical or percutaneous intervention. I think the best plan is to stabilize with medical therapy and see if we can get him to VAD over the next few weeks to months. Has fairly significant PAD but says he can walk 2 miles at baseline without claudication. Hopefully renal function will continue to improve. Will ask VAD team to see him today  2. STEMI/CAD -EKG showing new LBBB with pulmonary edema taken to cath where was found to have severe 3V disease unable to revascularize. Suspect distal RCA is culprit  lesion but hs trop > 27K --Cath 07/2016: EF 45-50%, mid LAD occluded w/ collaterals, D1 30% & D2 80% stenosis, dominant RCA 30% stenosis, patent LIMA and RIMA. No targets for revascularized and decided on medical management.  -Cath 10/11/2020: EF 15-20%, LVEDP 29, severe LAD disease w/ collaterals form D1 and small diagonal branches, CX moderate size w/ OM1 occluded at ostium, RCA mod disease in proximal and mid segment subtotall distall No targets for revascularization.  - no options for revascularization - now off heparin and CP free  - ASA/statin. Will need to stop Plavix if felt to be VAD canddiate at any point - ambulate w/ CR/ PT   3. Acute respiratory failure -Extubated  - Appreciate CCM assistance   4. AKI on CKD with hyperkalemia - Presentation Cr 1.83 w/ hyperkalemia - Baseline 1.2-1.4 - Creatinine  2.2 -> 1.9 Suspect ATN - Continue to follow, repeat BMP pending   5. DM uncontrolled -hx of DM foot wound, s/p amputation 3 toes -A1c 8.0% (down from 8.8% Aug 2021) -SSI and insulin gtt - Now on Jardiance  6. PAD s/p R SFA occlusion requiring DES  -Successful PTA and stenting of right SFA, prolonged procedure, difficult procedure with use of multiple balloons, guidewires and catheters. 100% stenosis reduced to 0% with implantation of 3 overlapping 6.0 x 120 x2; 6.0 x 100 mm Eluvia DES.   7. Morbid obesity - Body mass index is 44.12 kg/m.   Length of Stay: 3   Brittainy Ladoris Gene 10/14/2020, 7:44 AM  Advanced Heart Failure Team Pager [phone] (M-F; 7a - 4p)  Please contact [facility] for night-coverage after hours (4p -7a ) and weekends on [email or web address]  Patient seen and examined with the above-signed Advanced Practice Provider and/or Housestaff. I personally reviewed laboratory data, imaging studies and relevant notes. I independently examined the patient and formulated the important aspects of the plan. I have edited the note to reflect any of my changes or  salient points. I have personally discussed the plan with the patient and/or family.  Improved today. Denies CP or SOB. Remains in NSR. PVC burden reduced on oral amio. Co-ox marginal but acceptable at 55%. Volume status looks good. Creatinine improving. K low.   General:  Obese male sitting in chair. No resp difficulty HEENT: normal Neck: supple. no JVD. Carotids 2+ bilat; no bruits. No lymphadenopathy or thryomegaly appreciated. Cor: PMI nondisplaced. Regular rate & rhythm. No rubs, gallops or murmurs. Lungs: clear Abdomen: obesesoft, nontender, nondistended. No hepatosplenomegaly. No bruits or masses. Good bowel sounds. Extremities: no cyanosis, clubbing, rash, trace edema Neuro: alert & orientedx3, cranial nerves grossly intact. moves all 4 extremities w/o difficulty. Affect pleasant  Clinically improved today.  Co-ox still marginal. Discussed at length in VAD meeting today and felt he would be reasonable candidate when time is right. I discussed this with him and he wants to "explore it more" but would not be ready to commit currently. We will follow him closely as outpatient to see how he does and assist with decision making. D/w Dr. Einar Gip as well.   Total time spent 40 minutes. Over half that time spent discussing above.    Glori Bickers, MD  10:58 PM

## 2020-10-14 NOTE — Progress Notes (Signed)
Daily Progress Note   Patient Name: Eduardo [name]       Date: 10/14/2020 DOB: Aug 23, 1956  Age: 64 y.o. MRN#: [id number] Attending Physician: Adrian Prows, MD Primary Care Physician: Rita Ohara, MD Admit Date: 10/11/2020  Reason for Consultation/Follow-up:  To discuss complex medical decision making related to patient's goals of care  Subjective: Long enjoyable talk with Eduardo [name].  His quality of life is very important to him and he has a good sense of what the level of quality should be.  He wants to be able to go fly fishing but is OK just being in the shallow water if needed (doesn't have to put on the high boots). Eduardo [name] states if he ARRESTs he does not want to be resuscitated.   However if he is very ill he is willing to go to ICU and even be intubated.   He is DNR with full scope treatment. We talked further - He never wants a tracheostomy.  He never wants a PEG tube.   "I've had a great life and I'm ready to go if my time comes."      He and Eduardo [name] completed the Advanced Directive.  I will ask the chaplain to notarize if it can be arranged before he is discharged.    Eduardo [name]'s HCPOA is Eduardo [name].   His back up HCPOA is Eduardo [name].  He is glad he has time to think about the LVAD and prepare for it (if he is a candidate in the future).   He wants to stay and touch with Dr. Haroldine Laws to continue conversations/education about LVAD.   Assessment: 64 yo male with severe CAD.  EF is now approximately 20%, CKD 3, morbid obesity, PAD.  Much improved after STEMI and HF exacerbation.   Patient Profile/HPI:  64 y.o. male "Eduardo [name]" with past medical history of type 2 diabetes with metabolic syndrome, peripheral vascular disease, chronic kidney disease, who was admitted on 10/11/2020 with shortness of breath.   He was found to have pulmonary edema and an HS troponin of 27,000.  He was unable to successfully ventilate on his own and was intubated in the emergency department.  Code STEMI was called and he was immediately taken to the Cath Lab.  He was found to have severe three-vessel disease.  Initial thought was there were  no options for revascularization.  EF is 15 to 20%.  Currently (12/4) he is in cardiac ICU on high-dose pressors and propofol.  He remains intubated 12/4    Length of Stay: 3   Vital Signs: BP 130/71 (BP Location: Left Arm)   Pulse 84   Temp 98.6 F (37 C) (Oral)   Resp 18   Ht 5\' 10"  (1.778 m)   Wt (!) 139.5 kg   SpO2 96%   BMI 44.12 kg/m  SpO2: SpO2: 96 % O2 Device: O2 Device: Room Air O2 Flow Rate: O2 Flow Rate (L/min): 2 L/min       Palliative Assessment/Data: 50%     Palliative Care Plan    Recommendations/Plan: Code status changed to DNR if arrests but full scope treatment including intubation if needed. No Trach ever No PEG ever. Would not want prolonged life support Has a strong sense of self and quality of life is very important to him.  Code Status:  DNR  Prognosis:  Unable to determine Concern for acute decompensation and rehospitalization   Discharge Planning: Home with [facility] was discussed with patient  Thank you for allowing the Palliative Medicine Team to assist in the care of this patient.  Total time spent:   70 min     Greater than 50%  of this time was spent counseling and coordinating care related to the above assessment and plan.  Florentina Jenny, PA-C Palliative Medicine  Please contact Palliative MedicineTeam phone at [phone] for questions and concerns between 7 am - 7 pm.   Please see AMION for individual provider pager numbers.

## 2020-10-15 DIAGNOSIS — I5023 Acute on chronic systolic (congestive) heart failure: Secondary | ICD-10-CM | POA: Diagnosis not present

## 2020-10-15 LAB — BASIC METABOLIC PANEL
Anion gap: 12 (ref 5–15)
BUN: 46 mg/dL — ABNORMAL HIGH (ref 8–23)
CO2: 27 mmol/L (ref 22–32)
Calcium: 8.8 mg/dL — ABNORMAL LOW (ref 8.9–10.3)
Chloride: 100 mmol/L (ref 98–111)
Creatinine, Ser: 2.07 mg/dL — ABNORMAL HIGH (ref 0.61–1.24)
GFR, Estimated: 35 mL/min — ABNORMAL LOW (ref >60)
Glucose, Bld: 160 mg/dL — ABNORMAL HIGH (ref 70–99)
Potassium: 3.5 mmol/L (ref 3.5–5.1)
Sodium: 139 mmol/L (ref 135–145)

## 2020-10-15 LAB — GLUCOSE, CAPILLARY
Glucose-Capillary: 100 mg/dL — ABNORMAL HIGH (ref 70–99)
Glucose-Capillary: 112 mg/dL — ABNORMAL HIGH (ref 70–99)

## 2020-10-15 MED ORDER — EMPAGLIFLOZIN 10 MG PO TABS
10.0000 mg | ORAL_TABLET | Freq: Every day | ORAL | 1 refills | Status: DC
Start: 2020-10-16 — End: 2020-10-21

## 2020-10-15 MED ORDER — CARVEDILOL 6.25 MG PO TABS
6.2500 mg | ORAL_TABLET | Freq: Two times a day (BID) | ORAL | 1 refills | Status: DC
Start: 2020-10-15 — End: 2020-10-21

## 2020-10-15 MED ORDER — FUROSEMIDE 40 MG PO TABS
40.0000 mg | ORAL_TABLET | Freq: Every day | ORAL | 1 refills | Status: DC
Start: 2020-10-15 — End: 2020-12-10

## 2020-10-15 MED ORDER — LOSARTAN POTASSIUM 25 MG PO TABS
25.0000 mg | ORAL_TABLET | Freq: Every day | ORAL | 1 refills | Status: DC
Start: 2020-10-16 — End: 2020-11-21

## 2020-10-15 MED ORDER — ASPIRIN 81 MG PO CHEW
81.0000 mg | CHEWABLE_TABLET | Freq: Every day | ORAL | Status: DC
Start: 2020-10-16 — End: 2023-08-24

## 2020-10-15 MED ORDER — POTASSIUM CHLORIDE CRYS ER 20 MEQ PO TBCR
40.0000 meq | EXTENDED_RELEASE_TABLET | Freq: Once | ORAL | Status: AC
  Administered 2020-10-15: 40 meq via ORAL
  Filled 2020-10-15: qty 2

## 2020-10-15 MED FILL — Nitroglycerin IV Soln 100 MCG/ML in D5W: INTRA_ARTERIAL | Qty: 10 | Status: AC

## 2020-10-15 MED FILL — Cefazolin Sodium-Dextrose IV Solution 2 GM/100ML-4%: INTRAVENOUS | Qty: 100 | Status: AC

## 2020-10-15 MED FILL — Lidocaine HCl Local Preservative Free (PF) Inj 1%: INTRAMUSCULAR | Qty: 30 | Status: AC

## 2020-10-15 MED FILL — Verapamil HCl IV Soln 2.5 MG/ML: INTRAVENOUS | Qty: 2 | Status: AC

## 2020-10-15 NOTE — Progress Notes (Signed)
  Inpatient Diabetes Program Recommendations  AACE/ADA: New Consensus Statement on Inpatient Glycemic Control (2015)  Target Ranges:  Prepandial:   less than 140 mg/dL      Peak postprandial:   less than 180 mg/dL (1-2 hours)      Critically ill patients:  140 - 180 mg/dL   Lab Results  Component Value Date   GLUCAP 112 (H) 10/15/2020   HGBA1C 8.0 (H) 10/11/2020    Review of Glycemic Control Results for Eduardo [name], MOODY" (MRN [id number]) as of 10/15/2020 10:36  Ref. Range 10/14/2020 08:12 10/14/2020 12:10 10/14/2020 16:13 10/14/2020 20:05 10/14/2020 22:56 10/15/2020 04:06 10/15/2020 08:08  Glucose-Capillary Latest Ref Range: 70 - 99 mg/dL 106 (H) 185 (H) 307 (H) 236 (H) 253 (H) 100 (H) 112 (H)    Inpatient Diabetes Program Recommendations:   Insulin - Meal Coverage: Novolog 3 units TID if consumes at least 50%   Will continue to follow while inpatient.  Thank you, Reche Dixon, RN, BSN Diabetes Coordinator Inpatient Diabetes Program [phone] (team pager from 8a-5p)

## 2020-10-15 NOTE — Progress Notes (Signed)
Pt received discharge instructions and does not have any additional questions or concerns at this time. Pt is ready for discharge

## 2020-10-15 NOTE — Discharge Summary (Signed)
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 LBBB resolved prior to discharge. Ischemic cardiomyopathy with severe LV systolic dysfunction DM II uncontrolled with acute on chronic stage 3b CKD related to cardiogenic shock and hypoperfusion. Primary hypertension Hyperlipidemia Morbid obesity PAD with claudication   Significant Diagnostic Studies:  Left Heart Catheterization 10/11/20:  LV: Severely dilated.  Global hypokinesis.  Hand contrast injection hence not fully adequately visualized.  LVEF 15 to 20%.  EDP markedly elevated at 29 mmHg.  No pressure gradient across the aortic valve. Left main: Normal. LAD: Severely diffusely diseased.  Gives origin to large D1 which gives collaterals to the LAD and 2 smaller diagonals, LAD is occluded after the origin of D1, anatomy compared to prior in 2017 reveals progression of diffuse disease.  There are ipsilateral and contralateral collaterals to the LAD. CX: Moderate sized vessel, giving origin to large OM1.  OM1 is occluded in the ostium as was noted previously and has ipsilateral collaterals.  OM1 is diffusely diseased and faintly filled. RCA: Moderate disease in the proximal and mid segment.  At the bifurcation of PDA and PL, there is a high-grade 90% stenosis.  The bifurcation is also involved with at least a 90% stenosis in the PDA and a 60 to 70% stenosis in the PL branch.  There is no target as distally as the PL branch which is large is occluded distally that was also noted in 2017.  There are faint collaterals noted from the left to the RCA.  Impression: Severe native vessel three-vessel coronary artery disease with no significant targets for revascularization, LAD may be amenable for revascularization but I am not sure that this would help.  Patient is extremely ill with high  risk for mortality.  He also has underlying stage III-IV kidney disease and contrast nephropathy needs to be evaluated further in view of contrast load of 70 mL.   Echocardiogram 10/12/2020:  1. Not well visualized, LVH degree unable to be assessed due to poor echo window. . Left ventricular ejection fraction, by estimation, is 20 to 25%. The left ventricle has severely decreased function. The left ventricle demonstrates regional wall motion  abnormalities (see scoring diagram/findings for description). Left ventricular diastolic parameters are consistent with Grade I diastolic dysfunction (impaired relaxation).  2. Right ventricular systolic function is normal. The right ventricular size is normal.  3. The mitral valve is normal in structure. Trivial mitral valve regurgitation. No evidence of mitral stenosis.  4. The aortic valve is normal in structure. Aortic valve regurgitation is not visualized. No aortic stenosis is present.  5. The inferior vena cava is dilated in size with <50% respiratory variability, suggesting right atrial pressure of 15 mmHg.  EKG:  EKG 04/19/2020: Normal sinus rhythm at rate of 61 bpm, inferior infarct old. Anteroseptal infarct old. Nonspecific T abnormality. Borderline low voltage complexes.  EKG 08/11/2020: Sinus tachycardia at rate of 120 bpm, atypical left bundle branch block. No further analysis.  EKG 09/13/2020: Normal sinus rhythm at rate of 91 bpm, left atrial enlargement, inferior infarct old.  Anteroseptal infarct old.  Nonspecific T abnormality.  Compared to 08/11/2020, left bundle branch block not present.  Radiology: DG CHEST PORT 1 VIEW 10/12/2020: COMPARISON:  10/11/2020 FINDINGS: Vascular congestion with bilateral lower lung zone airspace opacities are without change. Upper lungs are clear. Suspect small effusions.  No pneumothorax. Endotracheal  tube, right internal jugular central venous line and nasal/orogastric tube are stable. IMPRESSION: 1.  No change from the previous day's exam. 2. Persistent vascular congestion and lower lung zone airspace opacities, the latter finding likely due to a combination of pleural fluid with atelectasis, with a possible component of either edema or infection.   Hospital Course: Eduardo [name] is a 64 y.o. male  patient admitted on 10/11/2020 when he presented with acute respiratory distress needing intubation in the emergency room, had new onset left bundle branch block and was emergently taken to the cardiac catheterization lab and found to have severe multivessel disease, with no good options for revascularization.  He was then transferred to the intensive care unit, remained intubated until the following morning and then extubated in the afternoon.  He was also in acute pulmonary edema and acute respiratory distress and was diuresed.  Due to frequent PVCs, he was also started on amiodarone prophylactically.  The following morning he remained stable, he was watched for additional 24 hours for any arrhythmias or any acute decompensated heart failure, IV Lasix was transitioned to p.o. Lasix and low-dose losartan was started along with Jardiance both for heart failure and for diabetes and vascular disease which he tolerated.  Consultation with advanced heart failure team with Dr. Pierre Bali was obtained.  He felt patient would be very appropriate candidate for LVAD.  Recommendations on discharge: Discharge home with close monitoring and TOC visit in 1 week with me.  Medication list provided to the patient.  He will continue to follow-up with advanced heart failure team for management of ischemic cardiomyopathy and congestive heart failure.  He will be continued on amiodarone for 8 to 12 weeks, optimize his medical care and if creatinine remains stable we could consider addition of Entresto.  For now he will be on low-dose losartan.  Metoprolol succinate was discontinued and switched over to carvedilol.  Weight loss  extensively discussed with the patient.  We will repeat his BMP after his visit in the office.  Discharge Exam: Vitals with BMI 10/15/2020 10/15/2020 10/14/2020  Height - - -  Weight - 305 lbs 8 oz -  BMI - 25.95 -  Systolic [phone]  Diastolic 57 56 57  Pulse 77 68 70     Physical Exam Constitutional:      Appearance: He is obese.  HENT:     Head: Atraumatic.     Mouth/Throat:     Mouth: Mucous membranes are moist.  Eyes:     Extraocular Movements: Extraocular movements intact.  Cardiovascular:     Rate and Rhythm: Normal rate.     Pulses:          Carotid pulses are 2+ on the right side and 2+ on the left side.      Popliteal pulses are 0 on the right side and 0 on the left side.       Dorsalis pedis pulses are 0 on the right side and 0 on the left side.       Posterior tibial pulses are 0 on the right side and 0 on the left side.     Heart sounds: Heart sounds are distant. No gallop.   Pulmonary:     Effort: Pulmonary effort is normal.     Breath sounds: Normal breath sounds.  Abdominal:     General: Abdomen is flat. Bowel sounds are normal.     Palpations: Abdomen is soft.     Comments: Obese,  pannus present  Musculoskeletal:        General: No swelling. Normal range of motion.     Cervical back: Normal range of motion and neck supple.  Skin:    Capillary Refill: Capillary refill takes less than 2 seconds.  Neurological:     General: No focal deficit present.     Mental Status: He is alert and oriented to person, place, and time.  Psychiatric:        Mood and Affect: Mood normal.        Behavior: Behavior normal.    Labs:   Lab Results  Component Value Date   WBC 9.0 10/14/2020   HGB 14.8 10/14/2020   HCT 42.7 10/14/2020   MCV 90.3 10/14/2020   PLT 132 (L) 10/14/2020    Recent Labs  Lab 10/11/20 1143 10/11/20 1424 10/15/20 0051  NA 136   < > 139  K 6.1*   < > 3.5  CL 102   < > 100  CO2 22   < > 27  BUN 36*   < > 46*  CREATININE 2.13*   < >  2.07*  CALCIUM 8.7*   < > 8.8*  PROT 6.2*  --   --   BILITOT 0.8  --   --   ALKPHOS 65  --   --   ALT 35  --   --   AST 124*  --   --   GLUCOSE 468*   < > 160*   < > = values in this interval not displayed.    Lipid Panel     Component Value Date/Time   CHOL 137 06/24/2020 0937   TRIG 144 10/12/2020 0511   HDL 33 (L) 06/24/2020 0937   CHOLHDL 4.2 06/24/2020 0937   CHOLHDL 4.4 09/28/2011 1358   VLDL 21 09/28/2011 1358   LDLCALC 72 06/24/2020 0937    BNP (last 3 results) Recent Labs    10/11/20 0500  BNP 575.7*    HEMOGLOBIN A1C Lab Results  Component Value Date   HGBA1C 8.0 (H) 10/11/2020   MPG 182.9 10/11/2020    Cardiac Panel    Ref Range & Units 4 d ago  (10/11/20) 4 d ago  (10/11/20) 4 d ago  (10/11/20)  Troponin I (High Sensitivity) <18 ng/L >27,000High Panic  1,693High Panic CM  97High CM        TSH Recent Labs    06/24/20 0937  TSH 1.650    FOLLOW UP PLANS AND APPOINTMENTS  Allergies as of 10/15/2020      Reactions   Food Nausea And Vomiting   Mussels cause nausea/vomiting      Medication List    STOP taking these medications   lisinopril 10 MG tablet Commonly known as: ZESTRIL   metoprolol succinate 100 MG 24 hr tablet Commonly known as: TOPROL-XL   OVER THE COUNTER MEDICATION     TAKE these medications   acetaminophen 325 MG tablet Commonly known as: TYLENOL Take 650 mg by mouth every 6 (six) hours as needed for headache (pain).   aspirin 81 MG chewable tablet Chew 1 tablet (81 mg total) by mouth daily. Start taking on: October 16, 2020   B-D UF III MINI PEN NEEDLES 31G X 5 MM Misc Generic drug: Insulin Pen Needle U UTD 6 TIMES A DAY   carvedilol 6.25 MG tablet Commonly known as: COREG Take 1 tablet (6.25 mg total) by mouth 2 (two) times daily with a meal.  clopidogrel 75 MG tablet Commonly known as: PLAVIX Take 1 tablet (75 mg total) by mouth daily.   empagliflozin 10 MG Tabs tablet Commonly known as:  JARDIANCE Take 1 tablet (10 mg total) by mouth daily. Start taking on: October 16, 2020   ESTER C PO Take 1,000 mg by mouth daily.   FreeStyle Libre 14 Day Sensor Misc USE UTD Q 14 DAYS SUBCUTANEOUS   furosemide 40 MG tablet Commonly known as: LASIX Take 1 tablet (40 mg total) by mouth daily. What changed:   medication strength  how much to take   gemfibrozil 600 MG tablet Commonly known as: LOPID Take 600 mg by mouth 2 (two) times daily before a meal.   HumaLOG KwikPen 100 UNIT/ML KwikPen Generic drug: insulin lispro Inject 0-20 Units into the skin 3 (three) times daily before meals. Sliding scale based on CBG   HumuLIN N KwikPen 100 UNIT/ML Kiwkpen Generic drug: Insulin NPH (Human) (Isophane) Inject 30-40 Units into the skin See admin instructions. Inject 30-40 units with breakfast and supper; inject 10-20 units with lunch if needed for high blood sugar   isosorbide mononitrate 60 MG 24 hr tablet Commonly known as: IMDUR Take 1 tablet (60 mg total) by mouth daily.   losartan 25 MG tablet Commonly known as: COZAAR Take 1 tablet (25 mg total) by mouth daily. Start taking on: October 16, 2020   multivitamin with minerals Tabs tablet Take 1 tablet by mouth daily.   nitroGLYCERIN 0.4 MG SL tablet Commonly known as: NITROSTAT Place 0.4 mg under the tongue every 5 (five) minutes as needed for chest pain.   PROBIOTIC-10 PO Take 1 capsule by mouth daily.   rosuvastatin 20 MG tablet Commonly known as: CRESTOR Take 1 tablet (20 mg total) by mouth daily.   Vitamin D 50 MCG (2000 UT) Caps Take 2,000 Units by mouth daily.       Follow-up Information    Adrian Prows, MD Follow up on 10/21/2020.   Specialty: Cardiology Why: 10:15 AM. Bring all medications Contact information: [place] [address] [phone]                Adrian Prows, MD, [facility] 10/15/2020, 9:43 AM Office: [phone]

## 2020-10-15 NOTE — Progress Notes (Signed)
Advanced Heart Failure Rounding Note   Subjective:    Feels better. No CP or SOB. Walking the room. Feels restless.  Creatinine up slightly   Echo EF 20-25% RV normal  Objective:   Weight Range:  Vital Signs:   Temp:  [98.3 F (36.8 C)-99 F (37.2 C)] 98.4 F (36.9 C) (12/07 0408) Pulse Rate:  [68-84] 68 (12/07 0408) Resp:  [11-20] 20 (12/07 0408) BP: (97-130)/(51-71) 104/56 (12/07 0408) SpO2:  [93 %-98 %] 93 % (12/07 0408) Weight:  [138.6 kg] 138.6 kg (12/07 0408) Last BM Date: 10/13/20  Weight change: Filed Weights   10/13/20 0600 10/14/20 0411 10/15/20 0408  Weight: (!) 141 kg (!) 139.5 kg (!) 138.6 kg    Intake/Output:   Intake/Output Summary (Last 24 hours) at 10/15/2020 0729 Last data filed at 10/15/2020 0400 Gross per 24 hour  Intake --  Output 2300 ml  Net -2300 ml     PHYSICAL EXAM: General:  Well appearing. No resp difficulty HEENT: normal Neck: supple. no JVD. Carotids 2+ bilat; no bruits. No lymphadenopathy or thryomegaly appreciated. Cor: PMI nondisplaced. Regular rate & rhythm. No rubs, gallops or murmurs. Lungs: clear Abdomen: obese soft, nontender, nondistended. No hepatosplenomegaly. No bruits or masses. Good bowel sounds. Extremities: no cyanosis, clubbing, rash, edema Neuro: alert & orientedx3, cranial nerves grossly intact. moves all 4 extremities w/o difficulty. Affect pleasant    Telemetry: NSR 60-70s, no arrthymias Personally reviewed  Labs: Basic Metabolic Panel: Recent Labs  Lab 10/11/20 1424 10/11/20 2139 10/12/20 0511 10/12/20 0511 10/12/20 1229 10/12/20 1229 10/13/20 0403 10/14/20 0540 10/15/20 0051  NA   < >  --  140  --  141  --  138 141 139  K   < >  --  3.7  --  3.4*  --  3.2* 3.3* 3.5  CL   < >  --  106  --  107  --  101 100 100  CO2   < >  --  20*  --  22  --  26 27 27   GLUCOSE   < >  --  216*  --  219*  --  190* 98 160*  BUN   < >  --  43*  --  43*  --  35* 30* 46*  CREATININE   < >  --  2.26*  --  2.21*   --  1.90* 1.89* 2.07*  CALCIUM   < >  --  8.6*   < > 8.4*   < > 8.0* 8.6* 8.8*  MG  --  2.5* 2.2  --   --   --   --   --   --   PHOS  --  2.2* 3.6  --   --   --   --   --   --    < > = values in this interval not displayed.    Liver Function Tests: Recent Labs  Lab 10/11/20 1143  AST 124*  ALT 35  ALKPHOS 65  BILITOT 0.8  PROT 6.2*  ALBUMIN 3.4*   No results for input(s): LIPASE, AMYLASE in the last 168 hours. No results for input(s): AMMONIA in the last 168 hours.  CBC: Recent Labs  Lab 10/11/20 0500 10/11/20 0918 10/12/20 0511 10/13/20 0403 10/14/20 0540  WBC 17.1*  --  16.3* 11.3* 9.0  NEUTROABS 10.5*  --   --   --   --   HGB 17.7* 15.6 16.0 13.9 14.8  HCT 56.5* 46.0 49.0 42.5 42.7  MCV 95.8  --  92.1 92.2 90.3  PLT 229  --  176 PLATELET CLUMPS NOTED ON SMEAR, UNABLE TO ESTIMATE 132*    Cardiac Enzymes: No results for input(s): CKTOTAL, CKMB, CKMBINDEX, TROPONINI in the last 168 hours.  BNP: BNP (last 3 results) Recent Labs    10/11/20 0500  BNP 575.7*    ProBNP (last 3 results) No results for input(s): PROBNP in the last 8760 hours.    Other results:  Imaging: No results found.   Medications:     Scheduled Medications: . aspirin  81 mg Oral Daily  . carvedilol  6.25 mg Oral BID WC  . chlorhexidine  15 mL Mouth Rinse BID  . Chlorhexidine Gluconate Cloth  6 each Topical Daily  . clopidogrel  75 mg Oral Daily  . empagliflozin  10 mg Oral Daily  . insulin aspart  0-9 Units Subcutaneous Q4H  . insulin detemir  35 Units Subcutaneous BID  . losartan  25 mg Oral Daily  . mouth rinse  15 mL Mouth Rinse q12n4p  . multivitamin with minerals  1 tablet Oral Daily  . pantoprazole  40 mg Oral QHS  . polyethylene glycol  17 g Oral Daily  . Ensure Max Protein  11 oz Oral QHS  . rosuvastatin  20 mg Oral Daily  . sodium chloride flush  10-40 mL Intracatheter Q12H  . sodium chloride flush  3 mL Intravenous Q12H    Infusions: . sodium chloride Stopped  (10/13/20 0847)    PRN Medications: sodium chloride, acetaminophen, ondansetron (ZOFRAN) IV, phenol, pneumococcal 23 valent vaccine, sodium chloride flush, sodium chloride flush   Assessment/Plan:   1. Acute on chronic heart failure - Echo 07/2016: EF 40%, grade I DD, inferior/lateral hypokinesis - cath 10/11/20 EF 15%.  - Echo 10/12/20 EF 20-25% RV normal - creatinine up slightly but likely ok for d/c - Discussed at length in VAD meeting and felt he would be reasonable candidate when time is right. I discussed this with him and he wants to "explore it more" but would not be ready to commit currently. We will follow him closely as outpatient to see how he does and assist with decision making. D/w Dr. Einar Gip as well.  - d/c on above meds + lasix 40 daily (start tomorrow) - carvedilol 6.25 bid - losartan 25 qd - jardiance 10 - lasix 40 daily  2. STEMI/CAD -EKG showing new LBBB with pulmonary edema taken to cath where was found to have severe 3V disease unable to revascularize. Suspect distal RCA is culprit lesion but hs trop > 27K --Cath 07/2016: EF 45-50%, mid LAD occluded w/ collaterals, D1 30% & D2 80% stenosis, dominant RCA 30% stenosis, patent LIMA and RIMA. No targets for revascularized and decided on medical management.  -Cath 10/11/2020: EF 15-20%, LVEDP 29, severe LAD disease w/ collaterals form D1 and small diagonal branches, CX moderate size w/ OM1 occluded at ostium, RCA mod disease in proximal and mid segment subtotall distall No targets for revascularization.  - no options for revascularization - now off heparin and CP free  - ASA/statin. Will need to stop Plavix if felt to be VAD canddiate at any point - ambulate w/ CR/ PT   3. Acute respiratory failure -Extubated   4. AKI on CKD with hyperkalemia - Presentation Cr 1.83 w/ hyperkalemia - Baseline 1.2-1.4 - Creatinine  2.2 -> 1.9 -> 2.1 Suspect ATN - Continue to follow, repeat BMP pending  5. DM uncontrolled -hx of  DM foot wound, s/p amputation 3 toes -A1c 8.0% (down from 8.8% Aug 2021) -SSI and insulin gtt - Now on Jardiance  6. PAD s/p R SFA occlusion requiring DES  -Successful PTA and stenting of right SFA, prolonged procedure, difficult procedure with use of multiple balloons, guidewires and catheters. 100% stenosis reduced to 0% with implantation of 3 overlapping 6.0 x 120 x2; 6.0 x 100 mm Eluvia DES.   7. Morbid obesity - Body mass index is 43.83 kg/m.   Length of Stay: 4   Glori Bickers MD 10/15/2020, 7:29 AM  Advanced Heart Failure Team Pager [phone] (M-F; [place])  Please contact [facility] for night-coverage after hours (4p -7a ) and weekends on [email or web address]

## 2020-10-15 NOTE — Progress Notes (Signed)
CARDIAC REHAB PHASE I   Offered to walk with pt. Pt states he has been walking independently without difficulty, states he feels good today. Pt given HF booklet, stressed importance of daily weights, monitoring symptoms, and exercise as able. Anxious to go home today. Will refer to CRP II [place].  [phone] Rufina Falco, RN BSN 10/15/2020 10:11 AM

## 2020-10-15 NOTE — Progress Notes (Signed)
This chaplain responded to PMT consult for spiritual care.  The Pt. is sitting up in the bedside recliner and talking about d/c with the chaplain.  The chaplain understands the Pt. will notarize his Advance Directive outside of the hospital and share the notarized document with the appropriate members of the healthcare team.  The chaplain is available for F/U spiritual care as needed.

## 2020-10-16 ENCOUNTER — Telehealth (HOSPITAL_COMMUNITY): Payer: Self-pay

## 2020-10-16 ENCOUNTER — Telehealth: Payer: Self-pay

## 2020-10-16 NOTE — Telephone Encounter (Signed)
-----   Message from Frutoso Chase sent at 10/15/2020  9:39 AM EST ----- Regarding: TOC Ptient needs TOC done appt 10/21/20 @ 10:15 with Lavone Nian

## 2020-10-16 NOTE — Telephone Encounter (Signed)
Location of hospitalization: [facility] Reason for hospitalization: Had a lot trouble breathing Date of discharge: 10/15/2020 @ 12pm Date of first communication with patient: today Person contacting patient: Gaye Alken, CMA Current symptoms: None. Patient feels fine. Do you understand why you were in the Hospital: Yes Questions regarding discharge instructions: None Where were you discharged to: Home Medications reviewed: Yes Allergies reviewed: Yes Dietary changes reviewed: Yes. Discussed low fat and low salt diet.  Referals reviewed: NA Activities of Daily Living: Able to with mild limitations Any transportation issues/concerns: None Any patient concerns: None Confirmed importance & date/time of Follow up appt: Yes Confirmed with patient if condition begins to worsen call. Pt was given the office number and encouraged to call back with questions or concerns: Yes

## 2020-10-16 NOTE — Telephone Encounter (Signed)
Attempted to call patient in regards to Cardiac Rehab - LM on VM 

## 2020-10-21 ENCOUNTER — Other Ambulatory Visit: Payer: Self-pay

## 2020-10-21 ENCOUNTER — Ambulatory Visit: Payer: Medicare Other | Admitting: Cardiology

## 2020-10-21 ENCOUNTER — Encounter: Payer: Self-pay | Admitting: Cardiology

## 2020-10-21 VITALS — BP 128/70 | HR 76 | Resp 16 | Ht 70.0 in | Wt 305.0 lb

## 2020-10-21 DIAGNOSIS — E1151 Type 2 diabetes mellitus with diabetic peripheral angiopathy without gangrene: Secondary | ICD-10-CM

## 2020-10-21 DIAGNOSIS — N1832 Chronic kidney disease, stage 3b: Secondary | ICD-10-CM | POA: Diagnosis not present

## 2020-10-21 DIAGNOSIS — I2510 Atherosclerotic heart disease of native coronary artery without angina pectoris: Secondary | ICD-10-CM

## 2020-10-21 DIAGNOSIS — I5022 Chronic systolic (congestive) heart failure: Secondary | ICD-10-CM

## 2020-10-21 DIAGNOSIS — I255 Ischemic cardiomyopathy: Secondary | ICD-10-CM

## 2020-10-21 DIAGNOSIS — I10 Essential (primary) hypertension: Secondary | ICD-10-CM | POA: Diagnosis not present

## 2020-10-21 MED ORDER — EMPAGLIFLOZIN 10 MG PO TABS: 10.0000 mg | ORAL_TABLET | Freq: Every day | ORAL | 3 refills | Status: DC

## 2020-10-21 MED ORDER — CARVEDILOL 12.5 MG PO TABS: 12.5000 mg | ORAL_TABLET | Freq: Two times a day (BID) | ORAL | 3 refills | Status: DC

## 2020-10-21 NOTE — Progress Notes (Signed)
Primary Physician/Referring:  Rita Ohara, MD  Patient ID: Eduardo [name], male    DOB: Feb 24, 1956, 64 y.o.   MRN: [id number]  Chief Complaint  Patient presents with  . PAD  . Coronary Artery Disease  . Follow-up    6 month   HPI:    Eduardo [name]  is a 64 y.o. Caucasian male  with controlled type 2 diabetes, hypertension, hyperlipidemia, coronary artery disease by angiography on 09/08/2016 revealing severe triple-vessel CAD with no targets for CABG, he presented with new onset left bundle branch block and acute fulminant pulmonary edema needing intubation on 10/11/2020, emergent cardiac catheterization essentially revealing progression of severe native vessel disease with no significant option for revascularization.  Although LAD was felt to be amenable for potential revascularization, it was unchanged from prior cardiac catheterization and RCA was felt to be the culprit, in view of diffuse disease medical management was recommended.  Past medical history is also significant for morbid obesity, diabetes mellitus with stage III AV chronic kidney disease, mild carotid atherosclerosis, right SFA occlusion S/P complex PV angiogram on 02/28/2019 with PTA to occluded right SFA with implantation of 3 overlapping 6.0 x 120 x2; 6.0 x 100 mm Eluvia DES for critical right leg ischemia now resolved. 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 He has mild disease in the left leg.  PAD has remained stable.  Due to frequent PVCs, he was also started on amiodarone prophylactically.  The following morning he remained stable, he was watched for additional 24 hours for any arrhythmias or any acute decompensated heart failure, IV Lasix was transitioned to p.o. Lasix and low-dose losartan was started along with Jardiance both for heart failure and for diabetes and vascular disease which he tolerated. Consultation with advanced heart failure team with Dr. Pierre Bali was obtained.  He felt patient  would be very appropriate candidate for LVAD.  He now presents for follow-up, states that he is trying his best to make lifestyle changes.  His weight has gradually been decreasing.  He is accompanied by his fiance at the bedside.  He does have dyspnea on exertion with activity but states that he has no PND or orthopnea or leg edema and overall has started to feel much better.  Past Medical History:  Diagnosis Date  . Chronic kidney disease    stage 3  . Colon polyp   . Coronary artery disease   . Diabetes mellitus 1987   under care of Dr. Chalmers Cater.  On insulin since 96 (off and on)  . Diabetic retinopathy   . Dupuytren contracture    R hand, s/p injection (Dr. Lenon Curt)  . Essential hypertension, benign   . Essential hypertension, benign 02/06/2019  . Frequency of urination and polyuria   . Hypertension   . Myocardial infarction [facility])    denies  . Neuromuscular disorder ([place])    Diabetic neuropathy  . Osteomyelitis ([place])    right foot  . Other testicular hypofunction   . Peripheral arterial disease ([place]) 10/28/2012  . Peritoneal abscess ([place]) 6/08   and buttock.  . Pneumonia   . Polydipsia   . Proteinuria   . Pure hyperglyceridemia   . Subacute osteomyelitis, right ankle and foot ([place])   . Wears glasses    Past Surgical History:  Procedure Laterality Date  . ABDOMINAL AORTAGRAM N/A 04/18/2012   Procedure: ABDOMINAL Maxcine Ham;  Surgeon: Angelia Mould, MD;  Location: [facility] CATH LAB;  Service: Cardiovascular;  Laterality:  N/A;  . AMPUTATION Right 05/19/2019   Procedure: RIGHT FOOT 5TH RAY AMPUTATION;  Surgeon: Newt Minion, MD;  Location: [place];  Service: Orthopedics;  Laterality: Right;  . CARDIAC CATHETERIZATION N/A 09/08/2016   Procedure: Left Heart Cath and Coronary Angiography;  Surgeon: Adrian Prows, MD;  Location: [place] CV LAB;  Service: Cardiovascular;  Laterality: N/A;  . CATARACT EXTRACTION, BILATERAL  09/2017, 10/2017   Dr. Herbert Deaner  . COLONOSCOPY W/ BIOPSIES  AND POLYPECTOMY    . CORONARY/GRAFT ACUTE MI REVASCULARIZATION N/A 10/11/2020   Procedure: Coronary/Graft Acute MI Revascularization;  Surgeon: Adrian Prows, MD;  Location: [place] CV LAB;  Service: Cardiovascular;  Laterality: N/A;  . LEFT HEART CATH AND CORONARY ANGIOGRAPHY N/A 10/11/2020   Procedure: LEFT HEART CATH AND CORONARY ANGIOGRAPHY;  Surgeon: Adrian Prows, MD;  Location: [place] CV LAB;  Service: Cardiovascular;  Laterality: N/A;  . LOWER EXTREMITY ANGIOGRAM Bilateral 04/18/2012   Procedure: LOWER EXTREMITY ANGIOGRAM;  Surgeon: Angelia Mould, MD;  Location: [facility] CATH LAB;  Service: Cardiovascular;  Laterality: Bilateral;  bilat lower extrem angio  . LOWER EXTREMITY ANGIOGRAPHY Bilateral 05/02/2019   Procedure: LOWER EXTREMITY ANGIOGRAPHY;  Surgeon: Adrian Prows, MD;  Location: [place] CV LAB;  Service: Cardiovascular;  Laterality: Bilateral;  . LOWER EXTREMITY ANGIOGRAPHY Bilateral 02/28/2019   Procedure: LOWER EXTREMITY ANGIOGRAPHY;  Surgeon: Adrian Prows, MD;  Location: [place] CV LAB;  Service: Cardiovascular;  Laterality: Bilateral;  . macular photocoagulation     (eye treatments for diabetic retinopathy)-Dr. Zigmund Daniel  . PERIPHERAL VASCULAR INTERVENTION  02/28/2019   Procedure: PERIPHERAL VASCULAR INTERVENTION;  Surgeon: Adrian Prows, MD;  Location: [place] CV LAB;  Service: Cardiovascular;;    Family History  Problem Relation Age of Onset  . Diabetes Mother   . Hearing loss Mother   . Hypertension Mother   . Hyperlipidemia Mother   . Heart disease Mother   . Varicose Veins Mother   . Varicose Veins Father   . Dementia Father   . Hyperlipidemia Brother   . Diabetes Maternal Grandmother    Social History   Tobacco Use  . Smoking status: Former Smoker    Packs/day: 1.00    Years: 30.00    Pack years: 30.00    Types: Cigarettes    Quit date: 01/08/2012    Years since quitting: 8.7  . Smokeless tobacco: Never Used  Substance Use Topics  . Alcohol use: Yes     Comment: rare   Marital Status: Widowed ROS  Review of Systems  Cardiovascular: Positive for dyspnea on exertion (stable). Negative for leg swelling and syncope.  All other systems reviewed and are negative.  Objective  Blood pressure 128/70, pulse 76, resp. rate 16, height _0  (1.778 m), weight (!) 305 lb (138.3 kg), SpO2 95 %.  Vitals with BMI 10/21/2020 10/15/2020 10/15/2020  Height _1  - -  Weight 305 lbs - 305 lbs 8 oz  BMI 43.32 - 95.18  Systolic 841 660 630  Diastolic 70 57 56  Pulse 76 77 68     Physical Exam Constitutional:      Appearance: He is well-developed.     Comments: Morbidly obese, in no acute distress  HENT:     Head: Atraumatic.  Neck:     Thyroid: No thyromegaly.     Comments: Short neck and difficult to evaluate JVP Cardiovascular:     Pulses:          Carotid pulses are 2+ on the right  side and 2+ on the left side.      Popliteal pulses are 0 on the right side and 2+ on the left side.       Dorsalis pedis pulses are 1+ on the right side and 2+ on the left side.       Posterior tibial pulses are 0 on the right side and 1+ on the left side.     Heart sounds: No murmur heard. No gallop.      Comments: Femoral and popliteal pulse difficult to feel due to patient's body habitus. Neuropathic ulcer right sole of the foot slowly healing and chronic. Pulmonary:     Effort: Pulmonary effort is normal.  Abdominal:     Palpations: Abdomen is soft.     Comments: Obese. Pannus present  Musculoskeletal:        General: Normal range of motion.     Cervical back: Neck supple.  Skin:    General: Skin is dry.  Neurological:     Mental Status: He is alert.    Laboratory examination:   Recent Labs    10/13/20 0403 10/14/20 0540 10/15/20 0051  NA 138 141 139  K 3.2* 3.3* 3.5  CL 101 100 100  CO2 _0 GLUCOSE 190* 98 160*  BUN 35* 30* 46*  CREATININE 1.90* 1.89* 2.07*  CALCIUM 8.0* 8.6* 8.8*  GFRNONAA 39* 39* 35*   estimated creatinine  clearance is 49.9 mL/min (A) (by C-G formula based on SCr of 2.07 mg/dL (H)).  CMP Latest Ref Rng & Units 10/15/2020 10/14/2020 10/13/2020  Glucose 70 - 99 mg/dL 160(H) 98 190(H)  BUN 8 - 23 mg/dL 46(H) 30(H) 35(H)  Creatinine 0.61 - 1.24 mg/dL 2.07(H) 1.89(H) 1.90(H)  Sodium 135 - 145 mmol/L 139 141 138  Potassium 3.5 - 5.1 mmol/L 3.5 3.3(L) 3.2(L)  Chloride 98 - 111 mmol/L 100 100 101  CO2 22 - 32 mmol/L _1 Calcium 8.9 - 10.3 mg/dL 8.8(L) 8.6(L) 8.0(L)  Total Protein 6.5 - 8.1 g/dL - - -  Total Bilirubin 0.3 - 1.2 mg/dL - - -  Alkaline Phos 38 - 126 U/L - - -  AST 15 - 41 U/L - - -  ALT 0 - 44 U/L - - -   CBC Latest Ref Rng & Units 10/14/2020 10/13/2020 10/12/2020  WBC 4.0 - 10.5 K/uL 9.0 11.3(H) 16.3(H)  Hemoglobin 13.0 - 17.0 g/dL 14.8 13.9 16.0  Hematocrit 39.0 - 52.0 % 42.7 42.5 49.0  Platelets 150 - 400 K/uL 132(L) PLATELET CLUMPS NOTED ON SMEAR, UNABLE TO ESTIMATE 176   Lipid Panel     Component Value Date/Time   CHOL 137 06/24/2020 0937   TRIG 144 10/12/2020 0511   HDL 33 (L) 06/24/2020 0937   CHOLHDL 4.2 06/24/2020 0937   CHOLHDL 4.4 09/28/2011 1358   VLDL 21 09/28/2011 1358   LDLCALC 72 06/24/2020 0937    HEMOGLOBIN A1C Lab Results  Component Value Date   HGBA1C 8.0 (H) 10/11/2020   MPG 182.9 10/11/2020   TSH Recent Labs    06/24/20 0937  TSH 1.650   External Labs:  Glucose Random 453.000 M 09/08/2019 BUN 38.000 M 09/08/2019 Creatinine, Serum 1.340 MG/ 09/08/2019  PSA 3.300 06/12/2019 01/19/2018: Cholesterol 115, triglycerides 112, HDL 39, LDL 54.  Glucose 125, creatinine 1.16, potassium 5.2, EGFR 67, CMP otherwise normal.  CBC normal.  TSH 1.5.  Medications and allergies   Allergies  Allergen Reactions  . Food Nausea And Vomiting  Mussels cause nausea/vomiting    Current Outpatient Medications on File Prior to Visit  Medication Sig Dispense Refill  . acetaminophen (TYLENOL) 325 MG tablet Take 650 mg by mouth every 6 (six) hours as needed for  headache (pain).    Marland Kitchen aspirin 81 MG chewable tablet Chew 1 tablet (81 mg total) by mouth daily.    . B-D UF III MINI PEN NEEDLES 31G X 5 MM MISC U UTD 6 TIMES A DAY  0  . Bioflavonoid Products (ESTER C PO) Take 1,000 mg by mouth daily.    . Cholecalciferol (VITAMIN D) 2000 units CAPS Take 2,000 Units by mouth daily.    . clopidogrel (PLAVIX) 75 MG tablet Take 1 tablet (75 mg total) by mouth daily. 90 tablet 0  . Continuous Blood Gluc Sensor (FREESTYLE LIBRE 14 DAY SENSOR) MISC USE UTD Q 14 DAYS SUBCUTANEOUS    . furosemide (LASIX) 40 MG tablet Take 1 tablet (40 mg total) by mouth daily. 30 tablet 1  . gemfibrozil (LOPID) 600 MG tablet Take 600 mg by mouth 2 (two) times daily before a meal.    . HUMALOG KWIKPEN 100 UNIT/ML KiwkPen Inject 0-20 Units into the skin 3 (three) times daily before meals. Sliding scale based on CBG  1  . HUMULIN N KWIKPEN 100 UNIT/ML Kiwkpen Inject 30-40 Units into the skin See admin instructions. Inject 30-40 units with breakfast and supper; inject 10-20 units with lunch if needed for high blood sugar  1  . isosorbide mononitrate (IMDUR) 60 MG 24 hr tablet Take 1 tablet (60 mg total) by mouth daily. 90 tablet 3  . losartan (COZAAR) 25 MG tablet Take 1 tablet (25 mg total) by mouth daily. 30 tablet 1  . Multiple Vitamin (MULTIVITAMIN WITH MINERALS) TABS tablet Take 1 tablet by mouth daily.    . nitroGLYCERIN (NITROSTAT) 0.4 MG SL tablet Place 0.4 mg under the tongue every 5 (five) minutes as needed for chest pain.   1  . Probiotic Product (PROBIOTIC-10 PO) Take 1 capsule by mouth daily.    . rosuvastatin (CRESTOR) 20 MG tablet Take 1 tablet (20 mg total) by mouth daily. 90 tablet 3   No current facility-administered medications on file prior to visit.    Medications Discontinued During This Encounter  Medication Reason  . carvedilol (COREG) 6.25 MG tablet   . empagliflozin (JARDIANCE) 10 MG TABS tablet Reorder    Radiology:   DG CHEST PORT 1 VIEW  10/12/2020: COMPARISON: 10/11/2020 FINDINGS: Vascular congestion with bilateral lower lung zone airspace opacities are without change. Upper lungs are clear. Suspect small effusions. No pneumothorax. Endotracheal tube, right internal jugular central venous line and nasal/orogastric tube are stable. IMPRESSION: 1. No change from the previous day's exam. 2. Persistent vascular congestion and lower lung zone airspace opacities, the latter finding likely due to a combination of pleural fluid with atelectasis, with a possible component of either edema or infection.   Cardiac Studies:     Exercise sestamibi stress test 08/17/2016: 1. Resting EKG demonstrates normal sinus rhythm, left axis deviation, left plantar fascicular block.  Poor R-wave progression, ulnar disease pattern.  Nonspecific ST-T abnormality, cannot exclude lateral ischemia.  Stress EKG is equivocal for ischemia, patient developing atypical left otherwise for with exercise which reverted back to baseline immediately on combination of the stress test less than 60 seconds.  There was no additional ST-T wave changes of ischemia. Patient exercised on Bruce protocol for 4:25 minutes and achieved 5.45 METS. Stress test terminated  due to 89 % MPHR achieved (Target HR >85%). Symptoms included dizziness.  2.  2-Day protocol followed. Perfusion imaging studies demonstrate large sized severe perfusion defect involving the inferior, anterior and anterolateral consistent with inferior wall scar with moderate peri-infarct ischemia and severe anterior and anteroapical reversible ischemia extending from the base towards the apex.  Left ventricular systolic function calculating by QGS was markedly depressed at 26%.  This is a high risk study, consider further cardiac work-up.   Carotid artery duplex 02/16/2018: No hemodynamically significant arterial disease in the internal carotid artery bilaterally. Mild heterogenous plaque noted.  Antegrade right vertebral  artery flow. Antegrade left vertebral artery flow. Compared to the study done on 09/01/2016, left carotid noted as occlusion is an error. This may perhaps be due to low velocity noted in both carotid arteries and may indicate low systemic BP or reduced cardiac output. Clinical correlation recommended.  Peripheral arteriogram 02/28/2019: Pelvic aortogram with limited bifemoral arteriogram revealed patent distal abdominal aorta without aneurysm, patent, bilateral iliac and common femoral vessels. Right SFA is occluded in the ostium and reconstitutes outside of the Hunter's canal.  Below the right knee there is two-vessel runoff, AT is occluded.  Left leg was not studied beyond left common femoral artery. Successful PTA and stenting of right SFA, prolonged procedure, difficult procedure with use of multiple balloons, guidewires and catheters.  100% stenosis reduced to 0% with implantation of 3 overlapping 6.0 x 120 x2; 6.0 x 100 mm Eluvia DES. 180 mL contrast utilized.  Peripheral arteriogram 05/02/2019: Right common femoral artery and right proximal SFA previously placed stent widely patent.  Right iliac artery shows mild disease. Left iliac artery and left femoral arteries show very mild disease.  There is two-vessel runoff in the left leg, left AT is occluded. Intermediate stenosis noted in the left distal and proximal SFA, pressure pullback reveals no significant gradient.   ABI 08/09/2019: This exam reveals normal perfusion of the right lower extremity (ABI). This exam reveals normal perfusion of the left lower extremity (ABI).  The ABI may be falsely elevated due to medial calcinosis from diabetes. No significant change since 05/11/2019. Patient has h/o right SFA stenting.   Left Heart Catheterization 10/11/20: LV: Severely dilated. Global hypokinesis. Hand contrast injection hence not fully adequately visualized. LVEF 15 to 20%. EDP markedly elevated at 29 mmHg. No pressure gradient  across the aortic valve. Left main: Normal. LAD: Severely diffusely diseased. Gives origin to large D1 which gives collaterals to the LAD and 2 smaller diagonals, LAD is occluded after the origin of D1, anatomy compared to prior in 2017 reveals progression of diffuse disease. There are ipsilateral and contralateral collaterals to the LAD. CX: Moderate sized vessel, giving origin to large OM1. OM1 is occluded in the ostium as was noted previously and has ipsilateral collaterals. OM1 is diffusely diseased and faintly filled. RCA: Moderate disease in the proximal and mid segment. At the bifurcation of PDA and PL, there is a high-grade 90% stenosis. The bifurcation is also involved with at least a 90% stenosis in the PDA and a 60 to 70% stenosis in the PL branch. There is no target as distally as the PL branch which is large is occluded distally and that was new from 2017. There are faint collaterals noted from the left to the RCA.  Impression: Severe native vessel three-vessel coronary artery disease with no significant targets for revascularization, LAD may be amenable for revascularization but I am not sure that this would help, anatomy  similar to 2017 but progression to diffuse disease. Patient is extremely ill with high risk for mortality. He also has underlying stage III-IV kidney disease and contrast nephropathy needs to be evaluated further in view of contrast load of 70 mL.   Echocardiogram 10/12/2020:  1. Not well visualized, LVH degree unable to be assessed due to poor echo window. . Left ventricular ejection fraction, by estimation, is 20 to 25%. The left ventricle has severely decreased function. The left ventricle demonstrates regional wall motion abnormalities (see scoring diagram/findings for description). Left ventricular diastolic parameters are consistent with Grade I diastolic dysfunction (impaired relaxation). 2. Right ventricular systolic function is normal. The right  ventricular size is normal. 3. The mitral valve is normal in structure. Trivial mitral valve regurgitation. No evidence of mitral stenosis. 4. The aortic valve is normal in structure. Aortic valve regurgitation is not visualized. No aortic stenosis is present. 5. The inferior vena cava is dilated in size with <50% respiratory variability, suggesting right atrial pressure of 15 mmHg.   EKG     EKG 08/11/2020: Sinus tachycardia at rate of 120 bpm, atypical left bundle branch block. No further analysis.  EKG 09/13/2020: Normal sinus rhythm at rate of 91 bpm, left atrial enlargement, inferior infarct old. Anteroseptal infarct old. Nonspecific T abnormality. Compared to 08/11/2020, left bundle branch block not present.  EKG 04/19/2020: Normal sinus rhythm at rate of 61 bpm, inferior infarct old.  Anteroseptal infarct old.  Nonspecific T abnormality.  Borderline low voltage complexes.  No significant change from 11/08/2019.   Assessment     ICD-10-CM   1. 3-vessel coronary artery disease  I25.10 carvedilol (COREG) 12.5 MG tablet    AMB referral to cardiac rehabilitation  2. Essential hypertension, benign  I10 CBC    CMP14+EGFR    Phosphorus  3. Stage 3b chronic kidney disease (HCC)  N18.32   4. DM (diabetes mellitus), type 2 with peripheral vascular complications (HCC)  A35.57 empagliflozin (JARDIANCE) 10 MG TABS tablet  5. Ischemic cardiomyopathy  I25.5   6. Chronic systolic heart failure (HCC)  I50.22 Brain natriuretic peptide    carvedilol (COREG) 12.5 MG tablet    empagliflozin (JARDIANCE) 10 MG TABS tablet    AMB referral to cardiac rehabilitation   Meds ordered this encounter  Medications  . carvedilol (COREG) 12.5 MG tablet    Sig: Take 1 tablet (12.5 mg total) by mouth 2 (two) times daily with a meal.    Dispense:  180 tablet    Refill:  3  . empagliflozin (JARDIANCE) 10 MG TABS tablet    Sig: Take 1 tablet (10 mg total) by mouth daily.    Dispense:  90 tablet     Refill:  3   Medications Discontinued During This Encounter  Medication Reason  . carvedilol (COREG) 6.25 MG tablet   . empagliflozin (JARDIANCE) 10 MG TABS tablet Reorder       Recommendations:    Eduardo [name]  is a 64 y.o. Caucasian male  with controlled type 2 diabetes, hypertension, hyperlipidemia, coronary artery disease by angiography on 09/08/2016 revealing severe triple-vessel CAD with no targets for CABG, he presented with new onset left bundle branch block and acute fulminant pulmonary edema needing intubation on 10/11/2020, emergent cardiac catheterization essentially revealing progression of severe native vessel disease with no significant option for revascularization.  Although LAD was felt to be amenable for potential revascularization, it was unchanged from prior cardiac catheterization and RCA was felt to be the culprit, in  view of diffuse disease medical management was recommended.  Past medical history is also significant for morbid obesity, diabetes mellitus with stage III AV chronic kidney disease, mild carotid atherosclerosis, right SFA occlusion S/P complex PV angiogram on 02/28/2019 with PTA to occluded right SFA with implantation of 3 overlapping 6.0 x 120 x2; 6.0 x 100 mm Eluvia DES for critical right leg ischemia now resolved. 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 He has mild disease in the left leg.  PAD has remained stable.  He is being also closely followed by advanced heart failure team.  I reviewed his angiograms again, I will consider repeating his cardiac catheterization if cardiac MR reveals any viability in the inferior wall and also in the anterior wall.  For now continue aggressive medical therapy.  I will repeat BMP and also BNP today, if renal function is stable, will consider adding Entresto.  But for now continue Jardiance 10 mg and also losartan 25 mg daily.  I will increase his carvedilol from 6.25 to 12.5 mg twice daily.  This was a  TOC visit 7 days.   Adrian Prows, MD, [facility] 10/21/2020, 1:22 PM Office: [phone] Pager: [phone]

## 2020-10-23 LAB — CBC
Hematocrit: 46.2 % (ref 37.5–51.0)
Hemoglobin: 15.6 g/dL (ref 13.0–17.7)
MCH: 31 pg (ref 26.6–33.0)
MCHC: 33.8 g/dL (ref 31.5–35.7)
MCV: 92 fL (ref 79–97)
Platelets: 259 10*3/uL (ref 150–450)
RBC: 5.03 x10E6/uL (ref 4.14–5.80)
RDW: 12.9 % (ref 11.6–15.4)
WBC: 7.1 10*3/uL (ref 3.4–10.8)

## 2020-10-23 LAB — CMP14+EGFR
ALT: 40 IU/L (ref 0–44)
AST: 31 IU/L (ref 0–40)
Albumin/Globulin Ratio: 1.4 (ref 1.2–2.2)
Albumin: 3.9 g/dL (ref 3.8–4.8)
Alkaline Phosphatase: 72 IU/L (ref 44–121)
BUN/Creatinine Ratio: 17 (ref 10–24)
BUN: 38 mg/dL — ABNORMAL HIGH (ref 8–27)
Bilirubin Total: 0.6 mg/dL (ref 0.0–1.2)
CO2: 26 mmol/L (ref 20–29)
Calcium: 9 mg/dL (ref 8.6–10.2)
Chloride: 103 mmol/L (ref 96–106)
Creatinine, Ser: 2.27 mg/dL — ABNORMAL HIGH (ref 0.76–1.27)
GFR calc Af Amer: 34 mL/min/{1.73_m2} — ABNORMAL LOW (ref >59)
GFR calc non Af Amer: 29 mL/min/{1.73_m2} — ABNORMAL LOW (ref >59)
Globulin, Total: 2.7 g/dL (ref 1.5–4.5)
Glucose: 199 mg/dL — ABNORMAL HIGH (ref 65–99)
Potassium: 5.7 mmol/L — ABNORMAL HIGH (ref 3.5–5.2)
Sodium: 144 mmol/L (ref 134–144)
Total Protein: 6.6 g/dL (ref 6.0–8.5)

## 2020-10-23 LAB — PHOSPHORUS: Phosphorus: 4.7 mg/dL — ABNORMAL HIGH (ref 2.8–4.1)

## 2020-10-23 LAB — BRAIN NATRIURETIC PEPTIDE: BNP: 460.7 pg/mL — ABNORMAL HIGH (ref 0.0–100.0)

## 2020-10-24 NOTE — Addendum Note (Signed)
Addended by: Kela Millin on: 10/24/2020 06:09 PM   Modules accepted: Orders

## 2020-10-28 ENCOUNTER — Encounter: Payer: Self-pay | Admitting: Family Medicine

## 2020-10-29 DIAGNOSIS — N1831 Chronic kidney disease, stage 3a: Secondary | ICD-10-CM | POA: Diagnosis not present

## 2020-11-05 ENCOUNTER — Other Ambulatory Visit: Payer: Self-pay

## 2020-11-05 MED ORDER — CLOPIDOGREL BISULFATE 75 MG PO TABS
75.0000 mg | ORAL_TABLET | Freq: Every day | ORAL | 1 refills | Status: DC
Start: 2020-11-05 — End: 2021-05-05

## 2020-11-13 ENCOUNTER — Other Ambulatory Visit: Payer: Self-pay

## 2020-11-13 LAB — BASIC METABOLIC PANEL
BUN/Creatinine Ratio: 21 (ref 10–24)
BUN: 32 mg/dL — ABNORMAL HIGH (ref 8–27)
CO2: 24 mmol/L (ref 20–29)
Calcium: 9 mg/dL (ref 8.6–10.2)
Chloride: 107 mmol/L — ABNORMAL HIGH (ref 96–106)
Creatinine, Ser: 1.52 mg/dL — ABNORMAL HIGH (ref 0.76–1.27)
GFR calc Af Amer: 55 mL/min/{1.73_m2} — ABNORMAL LOW (ref >59)
GFR calc non Af Amer: 47 mL/min/{1.73_m2} — ABNORMAL LOW (ref >59)
Glucose: 147 mg/dL — ABNORMAL HIGH (ref 65–99)
Potassium: 5.4 mmol/L — ABNORMAL HIGH (ref 3.5–5.2)
Sodium: 144 mmol/L (ref 134–144)

## 2020-11-13 LAB — BRAIN NATRIURETIC PEPTIDE: BNP: 630.2 pg/mL — ABNORMAL HIGH (ref 0.0–100.0)

## 2020-11-13 MED ORDER — ISOSORBIDE MONONITRATE ER 60 MG PO TB24
60.0000 mg | ORAL_TABLET | Freq: Every day | ORAL | 1 refills | Status: DC
Start: 2020-11-13 — End: 2020-11-13

## 2020-11-13 MED ORDER — ISOSORBIDE MONONITRATE ER 60 MG PO TB24
60.0000 mg | ORAL_TABLET | Freq: Every day | ORAL | 1 refills | Status: DC
Start: 2020-11-13 — End: 2021-04-29

## 2020-11-19 LAB — HEMOGLOBIN A1C: Hemoglobin A1C: 6.7

## 2020-11-20 ENCOUNTER — Encounter: Payer: Self-pay | Admitting: *Deleted

## 2020-11-21 ENCOUNTER — Ambulatory Visit: Payer: Medicare Other | Admitting: Cardiology

## 2020-11-21 ENCOUNTER — Other Ambulatory Visit: Payer: Self-pay

## 2020-11-21 ENCOUNTER — Encounter: Payer: Self-pay | Admitting: Cardiology

## 2020-11-21 VITALS — BP 136/74 | HR 82 | Temp 97.5°F | Resp 16 | Ht 70.0 in | Wt 295.0 lb

## 2020-11-21 DIAGNOSIS — N1831 Chronic kidney disease, stage 3a: Secondary | ICD-10-CM

## 2020-11-21 DIAGNOSIS — I2510 Atherosclerotic heart disease of native coronary artery without angina pectoris: Secondary | ICD-10-CM

## 2020-11-21 DIAGNOSIS — I5022 Chronic systolic (congestive) heart failure: Secondary | ICD-10-CM

## 2020-11-21 DIAGNOSIS — E7800 Pure hypercholesterolemia, unspecified: Secondary | ICD-10-CM

## 2020-11-21 DIAGNOSIS — E875 Hyperkalemia: Secondary | ICD-10-CM

## 2020-11-21 MED ORDER — LOKELMA 10 G PO PACK: 10.0000 g | PACK | Freq: Every day | ORAL | 1 refills | Status: DC

## 2020-11-21 MED ORDER — ENTRESTO 24-26 MG PO TABS: 1.0000 | ORAL_TABLET | Freq: Two times a day (BID) | ORAL | 0 refills | Status: DC

## 2020-11-21 NOTE — Progress Notes (Signed)
Primary Physician/Referring:  Rita Ohara, MD  Patient ID: Eduardo [name], male    DOB: [birth year], 65 y.o.   MRN: [id number]  Chief Complaint  Patient presents with  . Coronary Artery Disease  . PAD  . Follow-up    4 weeks   HPI:    Eduardo [name]  is a 65 y.o. Caucasian male  with controlled type 2 diabetes, hypertension, hyperlipidemia, coronary artery disease by angiography on 09/08/2016 revealing severe triple-vessel CAD with no targets for CABG, he presented with new onset left bundle branch block and acute fulminant pulmonary edema needing intubation on 10/11/2020, emergent cardiac catheterization essentially revealing progression of severe native vessel disease with no significant option for revascularization.  Although LAD was felt to be amenable for potential revascularization, it was unchanged from prior cardiac catheterization and RCA was felt to be the culprit, in view of diffuse disease medical management was recommended.  Past medical history is also significant for morbid obesity, diabetes mellitus with stage III AV chronic kidney disease, mild carotid atherosclerosis, right SFA occlusion S/P complex PV angiogram on 02/28/2019 with PTA to occluded right SFA with implantation of 3 overlapping 6.0 x 120 x2; 6.0 x 100 mm Eluvia DES for critical right leg ischemia now resolved. 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 He has mild disease in the left leg.  PAD has remained stable.  Due to frequent PVCs, he was also started on amiodarone prophylactically. I had discontinued Losartan after his last OV 3 weeks ago due to hyperkalemia. He is presently doing well and has lost 10 Lbs. No chest pain or dyspnea.   Past Medical History:  Diagnosis Date  . Chronic kidney disease    stage 3  . Colon polyp   . Coronary artery disease   . Diabetes mellitus 1987   under care of Dr. Chalmers Cater.  On insulin since 96 (off and on)  . Diabetic retinopathy   . Dupuytren contracture     R hand, s/p injection (Dr. Lenon Curt)  . Essential hypertension, benign   . Essential hypertension, benign 02/06/2019  . Frequency of urination and polyuria   . Hypertension   . Myocardial infarction [facility])    denies  . Neuromuscular disorder ([place])    Diabetic neuropathy  . Osteomyelitis ([place])    right foot  . Other testicular hypofunction   . Peripheral arterial disease ([place]) 10/28/2012  . Peritoneal abscess ([place]) 6/08   and buttock.  . Pneumonia   . Polydipsia   . Proteinuria   . Pure hyperglyceridemia   . Subacute osteomyelitis, right ankle and foot ([place])   . Wears glasses    Past Surgical History:  Procedure Laterality Date  . ABDOMINAL AORTAGRAM N/A 04/18/2012   Procedure: ABDOMINAL Maxcine Ham;  Surgeon: Angelia Mould, MD;  Location: [facility] CATH LAB;  Service: Cardiovascular;  Laterality: N/A;  . AMPUTATION Right 05/19/2019   Procedure: RIGHT FOOT 5TH RAY AMPUTATION;  Surgeon: Newt Minion, MD;  Location: [place];  Service: Orthopedics;  Laterality: Right;  . CARDIAC CATHETERIZATION N/A 09/08/2016   Procedure: Left Heart Cath and Coronary Angiography;  Surgeon: Adrian Prows, MD;  Location: [place] CV LAB;  Service: Cardiovascular;  Laterality: N/A;  . CATARACT EXTRACTION, BILATERAL  09/2017, 10/2017   Dr. Herbert Deaner  . COLONOSCOPY W/ BIOPSIES AND POLYPECTOMY    . CORONARY/GRAFT ACUTE MI REVASCULARIZATION N/A 10/11/2020   Procedure: Coronary/Graft Acute MI Revascularization;  Surgeon: Adrian Prows, MD;  Location: [place]  CV LAB;  Service: Cardiovascular;  Laterality: N/A;  . LEFT HEART CATH AND CORONARY ANGIOGRAPHY N/A 10/11/2020   Procedure: LEFT HEART CATH AND CORONARY ANGIOGRAPHY;  Surgeon: Adrian Prows, MD;  Location: [place] CV LAB;  Service: Cardiovascular;  Laterality: N/A;  . LOWER EXTREMITY ANGIOGRAM Bilateral 04/18/2012   Procedure: LOWER EXTREMITY ANGIOGRAM;  Surgeon: Angelia Mould, MD;  Location: [facility] CATH LAB;  Service: Cardiovascular;  Laterality: Bilateral;   bilat lower extrem angio  . LOWER EXTREMITY ANGIOGRAPHY Bilateral 05/02/2019   Procedure: LOWER EXTREMITY ANGIOGRAPHY;  Surgeon: Adrian Prows, MD;  Location: [place] CV LAB;  Service: Cardiovascular;  Laterality: Bilateral;  . LOWER EXTREMITY ANGIOGRAPHY Bilateral 02/28/2019   Procedure: LOWER EXTREMITY ANGIOGRAPHY;  Surgeon: Adrian Prows, MD;  Location: [place] CV LAB;  Service: Cardiovascular;  Laterality: Bilateral;  . macular photocoagulation     (eye treatments for diabetic retinopathy)-Dr. Zigmund Daniel  . PERIPHERAL VASCULAR INTERVENTION  02/28/2019   Procedure: PERIPHERAL VASCULAR INTERVENTION;  Surgeon: Adrian Prows, MD;  Location: [place] CV LAB;  Service: Cardiovascular;;    Family History  Problem Relation Age of Onset  . Diabetes Mother   . Hearing loss Mother   . Hypertension Mother   . Hyperlipidemia Mother   . Heart disease Mother   . Varicose Veins Mother   . Varicose Veins Father   . Dementia Father   . Hyperlipidemia Brother   . Diabetes Maternal Grandmother    Social History   Tobacco Use  . Smoking status: Former Smoker    Packs/day: 1.00    Years: 30.00    Pack years: 30.00    Types: Cigarettes    Quit date: 01/08/2012    Years since quitting: 8.8  . Smokeless tobacco: Never Used  Substance Use Topics  . Alcohol use: Yes    Comment: rare   Marital Status: Widowed ROS  Review of Systems  Cardiovascular: Positive for dyspnea on exertion (stable). Negative for leg swelling and syncope.  All other systems reviewed and are negative.  Objective  Blood pressure 136/74, pulse 82, temperature (!) 97.5 F (36.4 C), resp. rate 16, height 5' 10"  (1.778 m), weight 295 lb (133.8 kg), SpO2 95 %.  Vitals with BMI 11/21/2020 10/21/2020 10/15/2020  Height 5' 10"  5' 10"  -  Weight 295 lbs 305 lbs -  BMI 63.87 56.43 -  Systolic 329 518 841  Diastolic 74 70 57  Pulse 82 76 77     Physical Exam Constitutional:      Appearance: He is well-developed.     Comments:  Morbidly obese, in no acute distress  HENT:     Head: Atraumatic.  Neck:     Thyroid: No thyromegaly.     Comments: Short neck and difficult to evaluate JVP Cardiovascular:     Pulses:          Carotid pulses are 2+ on the right side and 2+ on the left side.      Popliteal pulses are 0 on the right side and 2+ on the left side.       Dorsalis pedis pulses are 1+ on the right side and 2+ on the left side.       Posterior tibial pulses are 0 on the right side and 1+ on the left side.     Heart sounds: No murmur heard. No gallop.      Comments: Femoral and popliteal pulse difficult to feel due to patient's body habitus. Neuropathic ulcer right sole of the  foot slowly healing and chronic. Pulmonary:     Effort: Pulmonary effort is normal.  Abdominal:     Palpations: Abdomen is soft.     Comments: Obese. Pannus present  Musculoskeletal:        General: Normal range of motion.     Cervical back: Neck supple.  Skin:    General: Skin is dry.  Neurological:     Mental Status: He is alert.    Laboratory examination:   Recent Labs    10/15/20 0051 10/22/20 1605 11/12/20 1351  NA 139 144 144  K 3.5 5.7* 5.4*  CL 100 103 107*  CO2 27 26 24   GLUCOSE 160* 199* 147*  BUN 46* 38* 32*  CREATININE 2.07* 2.27* 1.52*  CALCIUM 8.8* 9.0 9.0  GFRNONAA 35* 29* 47*  GFRAA  --  34* 55*   estimated creatinine clearance is 66.7 mL/min (A) (by C-G formula based on SCr of 1.52 mg/dL (H)).  CMP Latest Ref Rng & Units 11/12/2020 10/22/2020 10/15/2020  Glucose 65 - 99 mg/dL 147(H) 199(H) 160(H)  BUN 8 - 27 mg/dL 32(H) 38(H) 46(H)  Creatinine 0.76 - 1.27 mg/dL 1.52(H) 2.27(H) 2.07(H)  Sodium 134 - 144 mmol/L 144 144 139  Potassium 3.5 - 5.2 mmol/L 5.4(H) 5.7(H) 3.5  Chloride 96 - 106 mmol/L 107(H) 103 100  CO2 20 - 29 mmol/L 24 26 27   Calcium 8.6 - 10.2 mg/dL 9.0 9.0 8.8(L)  Total Protein 6.0 - 8.5 g/dL - 6.6 -  Total Bilirubin 0.0 - 1.2 mg/dL - 0.6 -  Alkaline Phos 44 - 121 IU/L - 72 -  AST 0  - 40 IU/L - 31 -  ALT 0 - 44 IU/L - 40 -   CBC Latest Ref Rng & Units 10/22/2020 10/14/2020 10/13/2020  WBC 3.4 - 10.8 x10E3/uL 7.1 9.0 11.3(H)  Hemoglobin 13.0 - 17.7 g/dL 15.6 14.8 13.9  Hematocrit 37.5 - 51.0 % 46.2 42.7 42.5  Platelets 150 - 450 x10E3/uL 259 132(L) PLATELET CLUMPS NOTED ON SMEAR, UNABLE TO ESTIMATE   Lipid Panel     Component Value Date/Time   CHOL 137 06/24/2020 0937   TRIG 144 10/12/2020 0511   HDL 33 (L) 06/24/2020 0937   CHOLHDL 4.2 06/24/2020 0937   CHOLHDL 4.4 09/28/2011 1358   VLDL 21 09/28/2011 1358   LDLCALC 72 06/24/2020 0937    HEMOGLOBIN A1C Lab Results  Component Value Date   HGBA1C 6.7 11/19/2020   MPG 182.9 10/11/2020   TSH Recent Labs    06/24/20 0937  TSH 1.650   External Labs:  Glucose Random 453.000 M 09/08/2019 BUN 38.000 M 09/08/2019 Creatinine, Serum 1.340 MG/ 09/08/2019  PSA 3.300 06/12/2019 01/19/2018: Cholesterol 115, triglycerides 112, HDL 39, LDL 54.  Glucose 125, creatinine 1.16, potassium 5.2, EGFR 67, CMP otherwise normal.  CBC normal.  TSH 1.5.  Medications and allergies   Allergies  Allergen Reactions  . Food Nausea And Vomiting    Mussels cause nausea/vomiting  . Testosterone Rash    Current Outpatient Medications on File Prior to Visit  Medication Sig Dispense Refill  . acetaminophen (TYLENOL) 325 MG tablet Take 650 mg by mouth every 6 (six) hours as needed for headache (pain).    Marland Kitchen aspirin 81 MG chewable tablet Chew 1 tablet (81 mg total) by mouth daily.    . B-D UF III MINI PEN NEEDLES 31G X 5 MM MISC U UTD 6 TIMES A DAY  0  . Bioflavonoid Products (ESTER C PO) Take 1,000  mg by mouth daily.    . carvedilol (COREG) 12.5 MG tablet Take 1 tablet (12.5 mg total) by mouth 2 (two) times daily with a meal. 180 tablet 3  . Cholecalciferol (VITAMIN D) 2000 units CAPS Take 2,000 Units by mouth daily.    . clopidogrel (PLAVIX) 75 MG tablet Take 1 tablet (75 mg total) by mouth daily. 90 tablet 1  . Continuous Blood  Gluc Sensor (FREESTYLE LIBRE 14 DAY SENSOR) MISC USE UTD Q 14 DAYS SUBCUTANEOUS    . empagliflozin (JARDIANCE) 10 MG TABS tablet Take 1 tablet (10 mg total) by mouth daily. 90 tablet 3  . furosemide (LASIX) 40 MG tablet Take 1 tablet (40 mg total) by mouth daily. 30 tablet 1  . gemfibrozil (LOPID) 600 MG tablet Take 600 mg by mouth 2 (two) times daily before a meal.    . HUMALOG KWIKPEN 100 UNIT/ML KiwkPen Inject 0-20 Units into the skin 3 (three) times daily before meals. Sliding scale based on CBG  1  . HUMULIN N KWIKPEN 100 UNIT/ML Kiwkpen Inject 30-40 Units into the skin See admin instructions. Inject 30-40 units with breakfast and supper; inject 10-20 units with lunch if needed for high blood sugar  1  . isosorbide mononitrate (IMDUR) 60 MG 24 hr tablet Take 1 tablet (60 mg total) by mouth daily. 90 tablet 1  . Multiple Vitamin (MULTIVITAMIN WITH MINERALS) TABS tablet Take 1 tablet by mouth daily.    . nitroGLYCERIN (NITROSTAT) 0.4 MG SL tablet Place 0.4 mg under the tongue every 5 (five) minutes as needed for chest pain.   1  . Probiotic Product (PROBIOTIC-10 PO) Take 1 capsule by mouth daily.    . rosuvastatin (CRESTOR) 20 MG tablet Take 1 tablet (20 mg total) by mouth daily. 90 tablet 3   No current facility-administered medications on file prior to visit.    Medications Discontinued During This Encounter  Medication Reason  . losartan (COZAAR) 25 MG tablet Discontinued by provider    Radiology:   DG CHEST PORT 1 VIEW 10/12/2020: COMPARISON: 10/11/2020 FINDINGS: Vascular congestion with bilateral lower lung zone airspace opacities are without change. Upper lungs are clear. Suspect small effusions. No pneumothorax. Endotracheal tube, right internal jugular central venous line and nasal/orogastric tube are stable. IMPRESSION: 1. No change from the previous day's exam. 2. Persistent vascular congestion and lower lung zone airspace opacities, the latter finding likely due to a combination  of pleural fluid with atelectasis, with a possible component of either edema or infection.   Cardiac Studies:     Exercise sestamibi stress test 08/17/2016: 1. Resting EKG demonstrates normal sinus rhythm, left axis deviation, left plantar fascicular block.  Poor R-wave progression, ulnar disease pattern.  Nonspecific ST-T abnormality, cannot exclude lateral ischemia.  Stress EKG is equivocal for ischemia, patient developing atypical left otherwise for with exercise which reverted back to baseline immediately on combination of the stress test less than 60 seconds.  There was no additional ST-T wave changes of ischemia. Patient exercised on Bruce protocol for 4:25 minutes and achieved 5.45 METS. Stress test terminated due to 89 % MPHR achieved (Target HR >85%). Symptoms included dizziness.  2.  2-Day protocol followed. Perfusion imaging studies demonstrate large sized severe perfusion defect involving the inferior, anterior and anterolateral consistent with inferior wall scar with moderate peri-infarct ischemia and severe anterior and anteroapical reversible ischemia extending from the base towards the apex.  Left ventricular systolic function calculating by QGS was markedly depressed at 26%.  This  is a high risk study, consider further cardiac work-up.   Carotid artery duplex 02/16/2018: No hemodynamically significant arterial disease in the internal carotid artery bilaterally. Mild heterogenous plaque noted.  Antegrade right vertebral artery flow. Antegrade left vertebral artery flow. Compared to the study done on 09/01/2016, left carotid noted as occlusion is an error. This may perhaps be due to low velocity noted in both carotid arteries and may indicate low systemic BP or reduced cardiac output. Clinical correlation recommended.  Peripheral arteriogram 02/28/2019: Pelvic aortogram with limited bifemoral arteriogram revealed patent distal abdominal aorta without aneurysm, patent, bilateral iliac and  common femoral vessels. Right SFA is occluded in the ostium and reconstitutes outside of the Hunter's canal.  Below the right knee there is two-vessel runoff, AT is occluded.  Left leg was not studied beyond left common femoral artery. Successful PTA and stenting of right SFA, prolonged procedure, difficult procedure with use of multiple balloons, guidewires and catheters.  100% stenosis reduced to 0% with implantation of 3 overlapping 6.0 x 120 x2; 6.0 x 100 mm Eluvia DES. 180 mL contrast utilized.  Peripheral arteriogram 05/02/2019: Right common femoral artery and right proximal SFA previously placed stent widely patent.  Right iliac artery shows mild disease. Left iliac artery and left femoral arteries show very mild disease.  There is two-vessel runoff in the left leg, left AT is occluded. Intermediate stenosis noted in the left distal and proximal SFA, pressure pullback reveals no significant gradient.   ABI 08/09/2019: This exam reveals normal perfusion of the right lower extremity (ABI). This exam reveals normal perfusion of the left lower extremity (ABI).  The ABI may be falsely elevated due to medial calcinosis from diabetes. No significant change since 05/11/2019. Patient has h/o right SFA stenting.   Left Heart Catheterization 10/11/20: LV: Severely dilated. Global hypokinesis. Hand contrast injection hence not fully adequately visualized. LVEF 15 to 20%. EDP markedly elevated at 29 mmHg. No pressure gradient across the aortic valve. Left main: Normal. LAD: Severely diffusely diseased. Gives origin to large D1 which gives collaterals to the LAD and 2 smaller diagonals, LAD is occluded after the origin of D1, anatomy compared to prior in 2017 reveals progression of diffuse disease. There are ipsilateral and contralateral collaterals to the LAD. CX: Moderate sized vessel, giving origin to large OM1. OM1 is occluded in the ostium as was noted previously and has ipsilateral  collaterals. OM1 is diffusely diseased and faintly filled. RCA: Moderate disease in the proximal and mid segment. At the bifurcation of PDA and PL, there is a high-grade 90% stenosis. The bifurcation is also involved with at least a 90% stenosis in the PDA and a 60 to 70% stenosis in the PL branch. There is no target as distally as the PL branch which is large is occluded distally and that was new from 2017. There are faint collaterals noted from the left to the RCA.  Impression: Severe native vessel three-vessel coronary artery disease with no significant targets for revascularization, LAD may be amenable for revascularization but I am not sure that this would help, anatomy similar to 2017 but progression to diffuse disease. Patient is extremely ill with high risk for mortality. He also has underlying stage III-IV kidney disease and contrast nephropathy needs to be evaluated further in view of contrast load of 70 mL.   Echocardiogram 10/12/2020:  1. Not well visualized, LVH degree unable to be assessed due to poor echo window. . Left ventricular ejection fraction, by estimation, is 20 to  25%. The left ventricle has severely decreased function. The left ventricle demonstrates regional wall motion abnormalities (see scoring diagram/findings for description). Left ventricular diastolic parameters are consistent with Grade I diastolic dysfunction (impaired relaxation). 2. Right ventricular systolic function is normal. The right ventricular size is normal. 3. The mitral valve is normal in structure. Trivial mitral valve regurgitation. No evidence of mitral stenosis. 4. The aortic valve is normal in structure. Aortic valve regurgitation is not visualized. No aortic stenosis is present. 5. The inferior vena cava is dilated in size with <50% respiratory variability, suggesting right atrial pressure of 15 mmHg.   EKG     EKG 08/11/2020: Sinus tachycardia at rate of 120 bpm, atypical left  bundle branch block. No further analysis.  EKG 09/13/2020: Normal sinus rhythm at rate of 91 bpm, left atrial enlargement, inferior infarct old. Anteroseptal infarct old. Nonspecific T abnormality. Compared to 08/11/2020, left bundle branch block not present.  EKG 04/19/2020: Normal sinus rhythm at rate of 61 bpm, inferior infarct old.  Anteroseptal infarct old.  Nonspecific T abnormality.  Borderline low voltage complexes.  No significant change from 11/08/2019.   Assessment     ICD-10-CM   1. 3-vessel coronary artery disease  I25.10   2. Chronic systolic heart failure (HCC)  I50.22 sodium zirconium cyclosilicate (LOKELMA) 10 g PACK packet    sacubitril-valsartan (ENTRESTO) 24-26 MG  3. Stage 3a chronic kidney disease (HCC)  O16.07 Basic metabolic panel    Basic metabolic panel  4. Hypercholesteremia  E78.00   5. Hyperkalemia, diminished renal excretion  E87.5    Meds ordered this encounter  Medications  . sodium zirconium cyclosilicate (LOKELMA) 10 g PACK packet    Sig: Take 10 g by mouth daily.    Dispense:  33 each    Refill:  1  . sacubitril-valsartan (ENTRESTO) 24-26 MG    Sig: Take 1 tablet by mouth 2 (two) times daily. Do not take until your potassium level < 4.5.    Dispense:  28 tablet    Refill:  0   Medications Discontinued During This Encounter  Medication Reason  . losartan (COZAAR) 25 MG tablet Discontinued by provider       Recommendations:    Eduardo [name]  is a 65 y.o. C Caucasian male  with controlled type 2 diabetes, hypertension, hyperlipidemia, coronary artery disease by angiography on 09/08/2016 revealing severe triple-vessel CAD with no targets for CABG, he presented with new onset left bundle branch block and acute fulminant pulmonary edema needing intubation on 10/11/2020, emergent cardiac catheterization essentially revealing progression of severe native vessel disease with no significant option for revascularization.   Past medical history is also  significant for morbid obesity, diabetes mellitus with stage III AV chronic kidney disease, mild carotid atherosclerosis, right SFA occlusion S/P complex PV angiogram on 02/28/2019 with PTA to occluded right SFA with Eluvia DES for critical right leg ischemia now resolved. 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 He has mild disease in the left leg.  PAD has remained stable.  He is being also closely followed by advanced heart failure team.  Due to hyperkalemia I discontinued losartan on his last office visit 3 weeks ago, advised him to start [facility] and repeat BMP 1 week later and if potassium <4 0.5-4.8 to start Entresto 24/26 mg twice daily and repeat BMP 1 week. Renal function has improved since last OV.  Weight loss again stressed with the patient.   Adrian Prows, MD, [facility]  11/21/2020, 10:00 PM Office: [phone] Pager: [phone]   CC: Madelon Lips, MD ([place])

## 2020-11-21 NOTE — Patient Instructions (Signed)
Start Lokelma check your potassium levels at [employer] [facility] in 1 week.  If potassium level <4 0.5-4.8, he can start Entresto 1 tablet twice daily, 1 week after starting the medication, please go back to the [facility] for repeat blood check.

## 2020-11-25 ENCOUNTER — Encounter (HOSPITAL_COMMUNITY): Payer: Medicare Other

## 2020-11-29 LAB — BASIC METABOLIC PANEL
BUN/Creatinine Ratio: 23 (ref 10–24)
BUN: 35 mg/dL — ABNORMAL HIGH (ref 8–27)
CO2: 24 mmol/L (ref 20–29)
Calcium: 9.3 mg/dL (ref 8.6–10.2)
Chloride: 106 mmol/L (ref 96–106)
Creatinine, Ser: 1.55 mg/dL — ABNORMAL HIGH (ref 0.76–1.27)
GFR calc Af Amer: 54 mL/min/{1.73_m2} — ABNORMAL LOW (ref >59)
GFR calc non Af Amer: 46 mL/min/{1.73_m2} — ABNORMAL LOW (ref >59)
Glucose: 84 mg/dL (ref 65–99)
Potassium: 4.9 mmol/L (ref 3.5–5.2)
Sodium: 147 mmol/L — ABNORMAL HIGH (ref 134–144)

## 2020-12-03 ENCOUNTER — Other Ambulatory Visit: Payer: Self-pay

## 2020-12-03 ENCOUNTER — Ambulatory Visit (HOSPITAL_COMMUNITY)
Admission: RE | Admit: 2020-12-03 | Discharge: 2020-12-03 | Disposition: A | Discharge status: Home / Self Care | Payer: Medicare Other | Source: Ambulatory Visit | Attending: Cardiology | Admitting: Cardiology

## 2020-12-03 VITALS — BP 131/77 | HR 82 | Ht 70.0 in | Wt 304.0 lb

## 2020-12-03 DIAGNOSIS — I2510 Atherosclerotic heart disease of native coronary artery without angina pectoris: Secondary | ICD-10-CM

## 2020-12-03 DIAGNOSIS — I739 Peripheral vascular disease, unspecified: Secondary | ICD-10-CM

## 2020-12-03 DIAGNOSIS — Z95811 Presence of heart assist device: Secondary | ICD-10-CM | POA: Diagnosis present

## 2020-12-03 DIAGNOSIS — N1832 Chronic kidney disease, stage 3b: Secondary | ICD-10-CM | POA: Diagnosis not present

## 2020-12-03 DIAGNOSIS — E1151 Type 2 diabetes mellitus with diabetic peripheral angiopathy without gangrene: Secondary | ICD-10-CM | POA: Diagnosis not present

## 2020-12-03 DIAGNOSIS — I5022 Chronic systolic (congestive) heart failure: Secondary | ICD-10-CM

## 2020-12-03 NOTE — Progress Notes (Signed)
Patient presents for VAD consult  in [facility] today. Recent hospitalization with initiation of VAD evaluation.    Pt was hospitalized early December with cardiogenic shock. We discussed VAD today at great length, pt had a lot of good questions. He was able to sit down and talk to a VAD pt in order to see a pt perspective. 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 The pt tells me that if he only had 6 months left to live he would be ok with that.  Pt was recently started on Entresto 24-26 on 1/20 and is tolerating this quite well.   Dr. Haroldine Laws would like the pt to start CR. I will reach out to them today to let them know he is ready.   Vital Signs:  HR: 82 BP: 131/77 (97) SPO2: 96 %   Weight: 304 lb w/o eqt Last weight: 305 lb   Symptom YES NO DETAILS  Angina  x Activity:  Claudication  x How Far:  Syncope  x When:  Stroke  x   Orthopnea   How many pillows:  PND   How often:  CPAP   How many hours:  Pedal Edema     Abdominal Fullness     Nausea / Vomit     Diaphoresis   When:  Shortness of Breath   Activity:  Palpitations   When:  ICD shock     Bleeding S/S     Tea-colored Urine     Hospitalizations     Emergency Room     Other MD     Activity   Fluid   Diet       Patient Instructions:  1. We will notifiy CR that you are ready for rehab. 2. Return to clinic in 6-8 weeks to see Dr. Haroldine Laws in CHF clinic     Tanda Rockers , RN [place] Coordinator    Office: [phone] 24/7 Emergency VAD Pager: [phone]

## 2020-12-04 NOTE — Progress Notes (Signed)
Primary Physician/Referring:  Rita Ohara, MD  Patient ID: Eduardo [name], male    DOB: [birth year], 65 y.o.   MRN: [id number]  Chief Complaint  Patient presents with  . Follow-up    10 day   HPI:    Eduardo [name]  is a 65 y.o. Caucasian male  with controlled type 2 diabetes, hypertension, hyperlipidemia, coronary artery disease by angiography on 09/08/2016 revealing severe triple-vessel CAD with no targets for CABG, he presented with new onset left bundle branch block and acute fulminant pulmonary edema needing intubation on 10/11/2020, emergent cardiac catheterization essentially revealing progression of severe native vessel disease with no significant option for revascularization.  Although LAD was felt to be amenable for potential revascularization, it was unchanged from prior cardiac catheterization and RCA was felt to be the culprit, in view of diffuse disease medical management was recommended.  Past medical history is also significant for morbid obesity, diabetes mellitus with stage III AV chronic kidney disease, mild carotid atherosclerosis, right SFA occlusion S/P complex PV angiogram on 02/28/2019 with PTA to occluded right SFA with implantation of 3 overlapping 6.0 x 120 x2; 6.0 x 100 mm Eluvia DES for critical right leg ischemia now resolved. 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 He has mild disease in the left leg.  PAD has remained stable.Due to frequent PVCs, he was also started on amiodarone  prophylactically.   Patient presents for 10 day follow up. At last visit, due to hyperkalemia, discontinued losartan and advised him to start [facility]. Repeat BMP revealed potassium 4.9, therefore patient was recommended to start Entresto 24/26 mg on 11/28/20 with repeat BMP 1 week following initiation. Patient seen 12/03/2020 for VAD consult and he prefers not to proceed with VAD at this time, however patient was recommended to start cardiac rehab.   Patient reports he is  feeling well and tolerating Entresto without issue. He has been increasing his physical activity and is active daily. He has not yet started cardiac rehab. He is currently taking Lokelma 10 g daily. Denies chest pain, palpitations, dyspnea, orthopnea, PND.   Past Medical History:  Diagnosis Date  . Chronic kidney disease    stage 3  . Colon polyp   . Coronary artery disease   . Diabetes mellitus 1987   under care of Dr. Chalmers Cater.  On insulin since 96 (off and on)  . Diabetic retinopathy   . Dupuytren contracture    R hand, s/p injection (Dr. Lenon Curt)  . Essential hypertension, benign   . Essential hypertension, benign 02/06/2019  . Frequency of urination and polyuria   . Hypertension   . Myocardial infarction [facility])    denies  . Neuromuscular disorder ([place])    Diabetic neuropathy  . Osteomyelitis ([place])    right foot  . Other testicular hypofunction   . Peripheral arterial disease ([place]) 10/28/2012  . Peritoneal abscess ([place]) 6/08   and buttock.  . Pneumonia   . Polydipsia   . Proteinuria   . Pure hyperglyceridemia   . Subacute osteomyelitis, right ankle and foot ([place])   . Wears glasses    Past Surgical History:  Procedure Laterality Date  . ABDOMINAL AORTAGRAM N/A 04/18/2012   Procedure: ABDOMINAL Maxcine Ham;  Surgeon: Angelia Mould, MD;  Location: [facility] CATH LAB;  Service: Cardiovascular;  Laterality: N/A;  . AMPUTATION Right 05/19/2019   Procedure: RIGHT FOOT 5TH RAY AMPUTATION;  Surgeon: Newt Minion, MD;  Location: [place];  Service: Orthopedics;  Laterality: Right;  . CARDIAC CATHETERIZATION N/A 09/08/2016   Procedure: Left Heart Cath and Coronary Angiography;  Surgeon: Adrian Prows, MD;  Location: [place] CV LAB;  Service: Cardiovascular;  Laterality: N/A;  . CATARACT EXTRACTION, BILATERAL  09/2017, 10/2017   Dr. Herbert Deaner  . COLONOSCOPY W/ BIOPSIES AND POLYPECTOMY    . CORONARY/GRAFT ACUTE MI REVASCULARIZATION N/A 10/11/2020   Procedure: Coronary/Graft Acute MI  Revascularization;  Surgeon: Adrian Prows, MD;  Location: [place] CV LAB;  Service: Cardiovascular;  Laterality: N/A;  . LEFT HEART CATH AND CORONARY ANGIOGRAPHY N/A 10/11/2020   Procedure: LEFT HEART CATH AND CORONARY ANGIOGRAPHY;  Surgeon: Adrian Prows, MD;  Location: [place] CV LAB;  Service: Cardiovascular;  Laterality: N/A;  . LOWER EXTREMITY ANGIOGRAM Bilateral 04/18/2012   Procedure: LOWER EXTREMITY ANGIOGRAM;  Surgeon: Angelia Mould, MD;  Location: [facility] CATH LAB;  Service: Cardiovascular;  Laterality: Bilateral;  bilat lower extrem angio  . LOWER EXTREMITY ANGIOGRAPHY Bilateral 05/02/2019   Procedure: LOWER EXTREMITY ANGIOGRAPHY;  Surgeon: Adrian Prows, MD;  Location: [place] CV LAB;  Service: Cardiovascular;  Laterality: Bilateral;  . LOWER EXTREMITY ANGIOGRAPHY Bilateral 02/28/2019   Procedure: LOWER EXTREMITY ANGIOGRAPHY;  Surgeon: Adrian Prows, MD;  Location: [place] CV LAB;  Service: Cardiovascular;  Laterality: Bilateral;  . macular photocoagulation     (eye treatments for diabetic retinopathy)-Dr. Zigmund Daniel  . PERIPHERAL VASCULAR INTERVENTION  02/28/2019   Procedure: PERIPHERAL VASCULAR INTERVENTION;  Surgeon: Adrian Prows, MD;  Location: [place] CV LAB;  Service: Cardiovascular;;    Family History  Problem Relation Age of Onset  . Diabetes Mother   . Hearing loss Mother   . Hypertension Mother   . Hyperlipidemia Mother   . Heart disease Mother   . Varicose Veins Mother   . Varicose Veins Father   . Dementia Father   . Hyperlipidemia Brother   . Diabetes Maternal Grandmother    Social History   Tobacco Use  . Smoking status: Former Smoker    Packs/day: 1.00    Years: 30.00    Pack years: 30.00    Types: Cigarettes    Quit date: 01/08/2012    Years since quitting: 8.9  . Smokeless tobacco: Never Used  Substance Use Topics  . Alcohol use: Yes    Comment: rare   Marital Status: Widowed ROS  Review of Systems  Constitutional: Negative for  malaise/fatigue and weight gain.  Cardiovascular: Positive for dyspnea on exertion (stable). Negative for chest pain, claudication, leg swelling, near-syncope, orthopnea, palpitations, paroxysmal nocturnal dyspnea and syncope.  Respiratory: Negative for shortness of breath.   Hematologic/Lymphatic: Does not bruise/bleed easily.  Gastrointestinal: Negative for melena.  Neurological: Negative for dizziness and weakness.  All other systems reviewed and are negative.  Objective  Blood pressure (!) 121/57, pulse 69, temperature (!) 96.7 F (35.9 C), resp. rate 16, height 5' 10"  (1.778 m), weight (!) 303 lb 12.8 oz (137.8 kg), SpO2 96 %.  Vitals with BMI 12/05/2020 12/03/2020 11/21/2020  Height 5' 10"  5' 10"  5' 10"   Weight 303 lbs 13 oz 304 lbs 295 lbs  BMI 43.59 16.10 96.04  Systolic 540 981 191  Diastolic 57 77 74  Pulse 69 82 82     Physical Exam Constitutional:      Appearance: He is well-developed.     Comments: Morbidly obese, in no acute distress  HENT:     Head: Atraumatic.  Neck:     Thyroid: No thyromegaly.     Comments: Short neck  and difficult to evaluate JVP Cardiovascular:     Pulses:          Carotid pulses are 2+ on the right side and 2+ on the left side.      Popliteal pulses are 0 on the right side and 2+ on the left side.       Dorsalis pedis pulses are 1+ on the right side and 2+ on the left side.       Posterior tibial pulses are 0 on the right side and 1+ on the left side.     Heart sounds: No murmur heard. No gallop.      Comments: Femoral and popliteal pulse difficult to feel due to patient's body habitus. Pulmonary:     Effort: Pulmonary effort is normal.  Abdominal:     Palpations: Abdomen is soft.     Comments: Obese. Pannus present  Musculoskeletal:        General: Normal range of motion.     Cervical back: Neck supple.  Skin:    General: Skin is dry.  Neurological:     Mental Status: He is alert.    Laboratory examination:   Recent Labs     11/12/20 1351 11/28/20 0930 12/05/20 1016  NA 144 147* 144  K 5.4* 4.9 4.8  CL 107* 106 103  CO2 24 24 23   GLUCOSE 147* 84 113*  BUN 32* 35* 38*  CREATININE 1.52* 1.55* 1.63*  CALCIUM 9.0 9.3 8.8  GFRNONAA 47* 46* 44*  GFRAA 55* 54* 50*   estimated creatinine clearance is 63.2 mL/min (A) (by C-G formula based on SCr of 1.63 mg/dL (H)).  CMP Latest Ref Rng & Units 12/05/2020 11/28/2020 11/12/2020  Glucose 65 - 99 mg/dL 113(H) 84 147(H)  BUN 8 - 27 mg/dL 38(H) 35(H) 32(H)  Creatinine 0.76 - 1.27 mg/dL 1.63(H) 1.55(H) 1.52(H)  Sodium 134 - 144 mmol/L 144 147(H) 144  Potassium 3.5 - 5.2 mmol/L 4.8 4.9 5.4(H)  Chloride 96 - 106 mmol/L 103 106 107(H)  CO2 20 - 29 mmol/L 23 24 24   Calcium 8.6 - 10.2 mg/dL 8.8 9.3 9.0  Total Protein 6.0 - 8.5 g/dL - - -  Total Bilirubin 0.0 - 1.2 mg/dL - - -  Alkaline Phos 44 - 121 IU/L - - -  AST 0 - 40 IU/L - - -  ALT 0 - 44 IU/L - - -   CBC Latest Ref Rng & Units 10/22/2020 10/14/2020 10/13/2020  WBC 3.4 - 10.8 x10E3/uL 7.1 9.0 11.3(H)  Hemoglobin 13.0 - 17.7 g/dL 15.6 14.8 13.9  Hematocrit 37.5 - 51.0 % 46.2 42.7 42.5  Platelets 150 - 450 x10E3/uL 259 132(L) PLATELET CLUMPS NOTED ON SMEAR, UNABLE TO ESTIMATE   Lipid Panel     Component Value Date/Time   CHOL 137 06/24/2020 0937   TRIG 144 10/12/2020 0511   HDL 33 (L) 06/24/2020 0937   CHOLHDL 4.2 06/24/2020 0937   CHOLHDL 4.4 09/28/2011 1358   VLDL 21 09/28/2011 1358   LDLCALC 72 06/24/2020 0937    HEMOGLOBIN A1C Lab Results  Component Value Date   HGBA1C 6.7 11/19/2020   MPG 182.9 10/11/2020   TSH Recent Labs    06/24/20 0937  TSH 1.650   External Labs:  Glucose Random 453.000 M 09/08/2019 BUN 38.000 M 09/08/2019 Creatinine, Serum 1.340 MG/ 09/08/2019  PSA 3.300 06/12/2019 01/19/2018: Cholesterol 115, triglycerides 112, HDL 39, LDL 54.  Glucose 125, creatinine 1.16, potassium 5.2, EGFR 67, CMP otherwise normal.  CBC  normal.  TSH 1.5.  Medications and allergies   Allergies   Allergen Reactions  . Food Nausea And Vomiting    Mussels cause nausea/vomiting  . Testosterone Rash    Current Outpatient Medications on File Prior to Visit  Medication Sig Dispense Refill  . acetaminophen (TYLENOL) 325 MG tablet Take 650 mg by mouth every 6 (six) hours as needed for headache (pain).    Marland Kitchen aspirin 81 MG chewable tablet Chew 1 tablet (81 mg total) by mouth daily.    . B-D UF III MINI PEN NEEDLES 31G X 5 MM MISC U UTD 6 TIMES A DAY  0  . Bioflavonoid Products (ESTER C PO) Take 1,000 mg by mouth daily.    . carvedilol (COREG) 12.5 MG tablet Take 1 tablet (12.5 mg total) by mouth 2 (two) times daily with a meal. 180 tablet 3  . Cholecalciferol (VITAMIN D) 2000 units CAPS Take 2,000 Units by mouth daily.    . clopidogrel (PLAVIX) 75 MG tablet Take 1 tablet (75 mg total) by mouth daily. 90 tablet 1  . Continuous Blood Gluc Sensor (FREESTYLE LIBRE 14 DAY SENSOR) MISC USE UTD Q 14 DAYS SUBCUTANEOUS    . empagliflozin (JARDIANCE) 10 MG TABS tablet Take 1 tablet (10 mg total) by mouth daily. 90 tablet 3  . furosemide (LASIX) 40 MG tablet Take 1 tablet (40 mg total) by mouth daily. 30 tablet 1  . gemfibrozil (LOPID) 600 MG tablet Take 600 mg by mouth 2 (two) times daily before a meal.    . HUMALOG KWIKPEN 100 UNIT/ML KiwkPen Inject 0-20 Units into the skin 3 (three) times daily before meals. Sliding scale based on CBG  1  . HUMULIN N KWIKPEN 100 UNIT/ML Kiwkpen Inject 30-40 Units into the skin See admin instructions. Inject 30-40 units with breakfast and supper; inject 10-20 units with lunch if needed for high blood sugar  1  . isosorbide mononitrate (IMDUR) 60 MG 24 hr tablet Take 1 tablet (60 mg total) by mouth daily. 90 tablet 1  . Multiple Vitamin (MULTIVITAMIN WITH MINERALS) TABS tablet Take 1 tablet by mouth daily.    . nitroGLYCERIN (NITROSTAT) 0.4 MG SL tablet Place 0.4 mg under the tongue every 5 (five) minutes as needed for chest pain.  1  . omega-3 acid ethyl esters (LOVAZA)  1 g capsule Take 350 mg by mouth 2 (two) times daily.    . Probiotic Product (PROBIOTIC-10 PO) Take 1 capsule by mouth daily.    . rosuvastatin (CRESTOR) 20 MG tablet Take 1 tablet (20 mg total) by mouth daily. 90 tablet 3  . sodium zirconium cyclosilicate (LOKELMA) 10 g PACK packet Take 10 g by mouth daily. 33 each 1   No current facility-administered medications on file prior to visit.    Medications Discontinued During This Encounter  Medication Reason  . sacubitril-valsartan (ENTRESTO) 24-26 MG     Radiology:   DG CHEST PORT 1 VIEW 10/12/2020: COMPARISON: 10/11/2020 FINDINGS: Vascular congestion with bilateral lower lung zone airspace opacities are without change. Upper lungs are clear. Suspect small effusions. No pneumothorax. Endotracheal tube, right internal jugular central venous line and nasal/orogastric tube are stable. IMPRESSION: 1. No change from the previous day's exam. 2. Persistent vascular congestion and lower lung zone airspace opacities, the latter finding likely due to a combination of pleural fluid with atelectasis, with a possible component of either edema or infection.   Cardiac Studies:     Exercise sestamibi stress test 08/17/2016: 1. Resting EKG  demonstrates normal sinus rhythm, left axis deviation, left plantar fascicular block.  Poor R-wave progression, ulnar disease pattern.  Nonspecific ST-T abnormality, cannot exclude lateral ischemia.  Stress EKG is equivocal for ischemia, patient developing atypical left otherwise for with exercise which reverted back to baseline immediately on combination of the stress test less than 60 seconds.  There was no additional ST-T wave changes of ischemia. Patient exercised on Bruce protocol for 4:25 minutes and achieved 5.45 METS. Stress test terminated due to 89 % MPHR achieved (Target HR >85%). Symptoms included dizziness.  2.  2-Day protocol followed. Perfusion imaging studies demonstrate large sized severe perfusion defect  involving the inferior, anterior and anterolateral consistent with inferior wall scar with moderate peri-infarct ischemia and severe anterior and anteroapical reversible ischemia extending from the base towards the apex.  Left ventricular systolic function calculating by QGS was markedly depressed at 26%.  This is a high risk study, consider further cardiac work-up.   Carotid artery duplex 02/16/2018: No hemodynamically significant arterial disease in the internal carotid artery bilaterally. Mild heterogenous plaque noted.  Antegrade right vertebral artery flow. Antegrade left vertebral artery flow. Compared to the study done on 09/01/2016, left carotid noted as occlusion is an error. This may perhaps be due to low velocity noted in both carotid arteries and may indicate low systemic BP or reduced cardiac output. Clinical correlation recommended.  Peripheral arteriogram 02/28/2019: Pelvic aortogram with limited bifemoral arteriogram revealed patent distal abdominal aorta without aneurysm, patent, bilateral iliac and common femoral vessels. Right SFA is occluded in the ostium and reconstitutes outside of the Hunter's canal.  Below the right knee there is two-vessel runoff, AT is occluded.  Left leg was not studied beyond left common femoral artery. Successful PTA and stenting of right SFA, prolonged procedure, difficult procedure with use of multiple balloons, guidewires and catheters.  100% stenosis reduced to 0% with implantation of 3 overlapping 6.0 x 120 x2; 6.0 x 100 mm Eluvia DES. 180 mL contrast utilized.  Peripheral arteriogram 05/02/2019: Right common femoral artery and right proximal SFA previously placed stent widely patent.  Right iliac artery shows mild disease. Left iliac artery and left femoral arteries show very mild disease.  There is two-vessel runoff in the left leg, left AT is occluded. Intermediate stenosis noted in the left distal and proximal SFA, pressure pullback reveals no  significant gradient.   ABI 08/09/2019: This exam reveals normal perfusion of the right lower extremity (ABI). This exam reveals normal perfusion of the left lower extremity (ABI).  The ABI may be falsely elevated due to medial calcinosis from diabetes. No significant change since 05/11/2019. Patient has h/o right SFA stenting.   Left Heart Catheterization 10/11/20: LV: Severely dilated. Global hypokinesis. Hand contrast injection hence not fully adequately visualized. LVEF 15 to 20%. EDP markedly elevated at 29 mmHg. No pressure gradient across the aortic valve. Left main: Normal. LAD: Severely diffusely diseased. Gives origin to large D1 which gives collaterals to the LAD and 2 smaller diagonals, LAD is occluded after the origin of D1, anatomy compared to prior in 2017 reveals progression of diffuse disease. There are ipsilateral and contralateral collaterals to the LAD. CX: Moderate sized vessel, giving origin to large OM1. OM1 is occluded in the ostium as was noted previously and has ipsilateral collaterals. OM1 is diffusely diseased and faintly filled. RCA: Moderate disease in the proximal and mid segment. At the bifurcation of PDA and PL, there is a high-grade 90% stenosis. The bifurcation is also involved with  at least a 90% stenosis in the PDA and a 60 to 70% stenosis in the PL branch. There is no target as distally as the PL branch which is large is occluded distally and that was new from 2017. There are faint collaterals noted from the left to the RCA.  Impression: Severe native vessel three-vessel coronary artery disease with no significant targets for revascularization, LAD may be amenable for revascularization but I am not sure that this would help, anatomy similar to 2017 but progression to diffuse disease. Patient is extremely ill with high risk for mortality. He also has underlying stage III-IV kidney disease and contrast nephropathy needs to be evaluated further in  view of contrast load of 70 mL.   Echocardiogram 10/12/2020:  1. Not well visualized, LVH degree unable to be assessed due to poor echo window. . Left ventricular ejection fraction, by estimation, is 20 to 25%. The left ventricle has severely decreased function. The left ventricle demonstrates regional wall motion abnormalities (see scoring diagram/findings for description). Left ventricular diastolic parameters are consistent with Grade I diastolic dysfunction (impaired relaxation). 2. Right ventricular systolic function is normal. The right ventricular size is normal. 3. The mitral valve is normal in structure. Trivial mitral valve regurgitation. No evidence of mitral stenosis. 4. The aortic valve is normal in structure. Aortic valve regurgitation is not visualized. No aortic stenosis is present. 5. The inferior vena cava is dilated in size with <50% respiratory variability, suggesting right atrial pressure of 15 mmHg.    EKG   EKG 08/11/2020: Sinus tachycardia at rate of 120 bpm, atypical left bundle branch block. No further analysis.  EKG 09/13/2020: Normal sinus rhythm at rate of 91 bpm, left atrial enlargement, inferior infarct old. Anteroseptal infarct old. Nonspecific T abnormality. Compared to 08/11/2020, left bundle branch block not present.  EKG 04/19/2020: Normal sinus rhythm at rate of 61 bpm, inferior infarct old.  Anteroseptal infarct old.  Nonspecific T abnormality.  Borderline low voltage complexes.  No significant change from 11/08/2019.   Assessment     ICD-10-CM   1. 3-vessel coronary artery disease  I25.10   2. Chronic systolic heart failure (HCC)  V94.80 Basic metabolic panel    sacubitril-valsartan (ENTRESTO) 24-26 MG    Basic metabolic panel  3. Hyperkalemia, diminished renal excretion  X65.5 Basic metabolic panel    Basic metabolic panel   Meds ordered this encounter  Medications  . sacubitril-valsartan (ENTRESTO) 24-26 MG    Sig: Take 2 tablets by  mouth 2 (two) times daily. Do not take until your potassium level < 4.5.    Dispense:  120 tablet    Refill:  0   Medications Discontinued During This Encounter  Medication Reason  . sacubitril-valsartan (ENTRESTO) 24-26 MG        Recommendations:    Eduardo [name]  is a 65 y.o. Caucasian male  with controlled type 2 diabetes, hypertension, hyperlipidemia, coronary artery disease by angiography on 09/08/2016 revealing severe triple-vessel CAD with no targets for CABG, he presented with new onset left bundle branch block and acute fulminant pulmonary edema needing intubation on 10/11/2020, emergent cardiac catheterization essentially revealing progression of severe native vessel disease with no significant option for revascularization.  Although LAD was felt to be amenable for potential revascularization, it was unchanged from prior cardiac catheterization and RCA was felt to be the culprit, in view of diffuse disease medical management was recommended.  Past medical history is also significant for morbid obesity, diabetes mellitus with stage  III AV chronic kidney disease, mild carotid atherosclerosis, right SFA occlusion S/P complex PV angiogram on 02/28/2019 with PTA to occluded right SFA with implantation of 3 overlapping 6.0 x 120 x2; 6.0 x 100 mm Eluvia DES for critical right leg ischemia now resolved. 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 He has mild disease in the left leg.  PAD has remained stable.Due to frequent PVCs, he was also started on amiodarone prophylactically.   Patient presents for 10 day follow up. At last visit, due to hyperkalemia, discontinued losartan and advised him to start [facility]. Repeat BMP revealed potassium 4.9, therefore patient was recommended to start Entresto 24/26 mg on 11/28/20 and repeat BMP revealed stable potassium at 4.8. Will continue Lokelma and increase Entresto to 2 tablets twice daily. Will repeat BMP in 1 week.   Discussed  importance of weight loss, as well as diet and lifestyle modifications. Patient has not yet started cardiac rehab, but referral has been sent. Encouraged patient to follow up with cardiac rehab scheduling if he has not heard from them in the next couple weeks. At this time patient is not pursuing VAD.   Patient was seen in collaboration with Dr. Einar Gip. He also reviewed patient's chart and Dr. Einar Gip is in agreement of the plan.     Eduardo Berthold, PA-C 12/09/2020, 5:13 PM Office: [phone]  CC: Madelon Lips, MD ([place])

## 2020-12-05 ENCOUNTER — Ambulatory Visit: Payer: Medicare Other | Admitting: Student

## 2020-12-05 ENCOUNTER — Other Ambulatory Visit: Payer: Self-pay

## 2020-12-05 ENCOUNTER — Encounter: Payer: Self-pay | Admitting: Student

## 2020-12-05 VITALS — BP 121/57 | HR 69 | Temp 96.7°F | Resp 16 | Ht 70.0 in | Wt 303.8 lb

## 2020-12-05 DIAGNOSIS — I5022 Chronic systolic (congestive) heart failure: Secondary | ICD-10-CM

## 2020-12-05 DIAGNOSIS — I2510 Atherosclerotic heart disease of native coronary artery without angina pectoris: Secondary | ICD-10-CM

## 2020-12-05 DIAGNOSIS — E875 Hyperkalemia: Secondary | ICD-10-CM

## 2020-12-06 LAB — BASIC METABOLIC PANEL
BUN/Creatinine Ratio: 23 (ref 10–24)
BUN: 38 mg/dL — ABNORMAL HIGH (ref 8–27)
CO2: 23 mmol/L (ref 20–29)
Calcium: 8.8 mg/dL (ref 8.6–10.2)
Chloride: 103 mmol/L (ref 96–106)
Creatinine, Ser: 1.63 mg/dL — ABNORMAL HIGH (ref 0.76–1.27)
GFR calc Af Amer: 50 mL/min/{1.73_m2} — ABNORMAL LOW (ref >59)
GFR calc non Af Amer: 44 mL/min/{1.73_m2} — ABNORMAL LOW (ref >59)
Glucose: 113 mg/dL — ABNORMAL HIGH (ref 65–99)
Potassium: 4.8 mmol/L (ref 3.5–5.2)
Sodium: 144 mmol/L (ref 134–144)

## 2020-12-08 NOTE — Progress Notes (Signed)
ADVANCED HF CLINIC NOTE  PCP: .Rita Ohara, MD Primary Cardiologist: Dr. Adrian Prows  HPI:  Eduardo [name] is 65 y.o. 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  Presented to [facility] ED on 12/3 from home via EMS per records after waking up at 2AM with acute hypoxic respiratory failure due to pulmonary edema. Intubated in ER. He was found to have new left bundle branch block with pulmonary edema and code STEMI called. He was taken to cath lab found to have global hypokinesis, EF 15-20%, LVEDP 29 and severe native three-vessel coronary artery  disease (LAD 100%p LCX all marginals occluded< RCA diffuse distal 99%) - with no obvious culprit lesion and no targets for revascularization. Hstrop peaked at 27k,.   Admitted to CCU for management of cardiogenic shock. Started on [place].  Echo EF 20-25% RV normal. He stabilized relatively quickly and was discharged home.   He presents today to the [facility] to discuss potential VAD therapy.   Says he is feeling pretty good. Denies CP or SOB. No edema, orthopnea or PND. No problems with meds. Wife died a few years ago and now has girlfriend who helps him. Likes to fish   ROS: All systems negative except as listed in HPI, PMH and Problem List.  SH:  Social History   Socioeconomic History  . Marital status: Widowed    Spouse name: Not on file  . Number of children: 0  . Years of education: Not on file  . Highest education level: Not on file  Occupational History  . 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 Smoking status: Former Smoker    Packs/day: 1.00    Years: 30.00    Pack years: 30.00    Types: Cigarettes    Quit date: 01/08/2012    Years since quitting: 8.9  . Smokeless tobacco: Never Used  Vaping Use  . Vaping Use: Never used  Substance  and Sexual Activity  . Alcohol use: Yes    Comment: rare  . Drug use: No  . Sexual activity: Yes    Partners: Female    Birth control/protection: Condom  Other Topics Concern  . Not on file  Social History Narrative   Widowed.    Girlfriends stays with him on weekends   Previously traveled 40 weeks out of the year. No longer travels at all (since COVID none, cut back on travel in 2019). No pets   Working from home.   Social Determinants of Health   Financial Resource Strain: Not on file  Food Insecurity: Not on file  Transportation Needs: Not on file  Physical Activity: Not on file  Stress: Not on file  Social Connections: Not on file  Intimate Partner Violence: Not on file    FH:  Family History  Problem Relation Age of Onset  . Diabetes Mother   . Hearing loss Mother   . Hypertension Mother   . Hyperlipidemia Mother   . Heart disease Mother   . Varicose Veins Mother   . Varicose Veins Father   . Dementia Father   . Hyperlipidemia Brother   . Diabetes Maternal Grandmother     Past Medical History:  Diagnosis Date  . Chronic kidney disease    stage 3  .  Colon polyp   . Coronary artery disease   . Diabetes mellitus 1987   under care of Dr. Chalmers Cater.  On insulin since 96 (off and on)  . Diabetic retinopathy   . Dupuytren contracture    R hand, s/p injection (Dr. Lenon Curt)  . Essential hypertension, benign   . Essential hypertension, benign 02/06/2019  . Frequency of urination and polyuria   . Hypertension   . Myocardial infarction [facility])    denies  . Neuromuscular disorder ([place])    Diabetic neuropathy  . Osteomyelitis ([place])    right foot  . Other testicular hypofunction   . Peripheral arterial disease ([place]) 10/28/2012  . Peritoneal abscess ([place]) 6/08   and buttock.  . Pneumonia   . Polydipsia   . Proteinuria   . Pure hyperglyceridemia   . Subacute osteomyelitis, right ankle and foot ([place])   . Wears glasses     Current Outpatient Medications  Medication  Sig Dispense Refill  . acetaminophen (TYLENOL) 325 MG tablet Take 650 mg by mouth every 6 (six) hours as needed for headache (pain).    Marland Kitchen aspirin 81 MG chewable tablet Chew 1 tablet (81 mg total) by mouth daily.    . B-D UF III MINI PEN NEEDLES 31G X 5 MM MISC U UTD 6 TIMES A DAY  0  . Bioflavonoid Products (ESTER C PO) Take 1,000 mg by mouth daily.    . carvedilol (COREG) 12.5 MG tablet Take 1 tablet (12.5 mg total) by mouth 2 (two) times daily with a meal. 180 tablet 3  . Cholecalciferol (VITAMIN D) 2000 units CAPS Take 2,000 Units by mouth daily.    . clopidogrel (PLAVIX) 75 MG tablet Take 1 tablet (75 mg total) by mouth daily. 90 tablet 1  . Continuous Blood Gluc Sensor (FREESTYLE LIBRE 14 DAY SENSOR) MISC USE UTD Q 14 DAYS SUBCUTANEOUS    . empagliflozin (JARDIANCE) 10 MG TABS tablet Take 1 tablet (10 mg total) by mouth daily. 90 tablet 3  . furosemide (LASIX) 40 MG tablet Take 1 tablet (40 mg total) by mouth daily. 30 tablet 1  . gemfibrozil (LOPID) 600 MG tablet Take 600 mg by mouth 2 (two) times daily before a meal.    . HUMALOG KWIKPEN 100 UNIT/ML KiwkPen Inject 0-20 Units into the skin 3 (three) times daily before meals. Sliding scale based on CBG  1  . HUMULIN N KWIKPEN 100 UNIT/ML Kiwkpen Inject 30-40 Units into the skin See admin instructions. Inject 30-40 units with breakfast and supper; inject 10-20 units with lunch if needed for high blood sugar  1  . isosorbide mononitrate (IMDUR) 60 MG 24 hr tablet Take 1 tablet (60 mg total) by mouth daily. 90 tablet 1  . Multiple Vitamin (MULTIVITAMIN WITH MINERALS) TABS tablet Take 1 tablet by mouth daily.    . Probiotic Product (PROBIOTIC-10 PO) Take 1 capsule by mouth daily.    . rosuvastatin (CRESTOR) 20 MG tablet Take 1 tablet (20 mg total) by mouth daily. 90 tablet 3  . sacubitril-valsartan (ENTRESTO) 24-26 MG Take 1 tablet by mouth 2 (two) times daily. Do not take until your potassium level < 4.5. 28 tablet 0  . sodium zirconium  cyclosilicate (LOKELMA) 10 g PACK packet Take 10 g by mouth daily. 33 each 1  . nitroGLYCERIN (NITROSTAT) 0.4 MG SL tablet Place 0.4 mg under the tongue every 5 (five) minutes as needed for chest pain.  1  . omega-3 acid ethyl esters (LOVAZA) 1 g capsule  Take 350 mg by mouth 2 (two) times daily.     No current facility-administered medications for this encounter.    Vitals:   12/03/20 1043  BP: 131/77  Pulse: 82  SpO2: 96%  Weight: (!) 137.9 kg (304 lb)  Height: 5\' 10"  (1.778 m)    PHYSICAL EXAM:  General:  Well appearing. No resp difficulty HEENT: normal Neck: supple. JVP flat. Carotids 2+ bilaterally; no bruits. No lymphadenopathy or thryomegaly appreciated. Cor: PMI normal. Regular rate & rhythm. No rubs, gallops or murmurs. Lungs: clear Abdomen: obese soft, nontender, nondistended. No hepatosplenomegaly. No bruits or masses. Good bowel sounds. Extremities: no cyanosis, clubbing, rash, edema Neuro: alert & orientedx3, cranial nerves grossly intact. Moves all 4 extremities w/o difficulty. Affect pleasant.  ASSESSMENT & PLAN:  1. Acute on chronic systolic heart failure - Echo 07/2016: EF 40%, grade I DD, inferior/lateral hypokinesis - cath 10/11/20 EF 15%.  - Echo 10/12/20 EF 20-25% RV normal - Doing well NYHA II. Volume status looks good on lasix 40 daily  - Continue carvedilol 6.25 bid - Continue Entresto 24/26 bid - Continue jardiance 10 - Off spiro with hyperkalemia - check labs today - Long discussion about advanced HF options. No transplant candidate with PAD, obesity and other comorbidities. He states he is not interested in VAD at this time and would be OK if that meant he only had 6 months to live,   2. CAD with recent ACS 12/21 - 12/21 EKG showing new LBBB with pulmonary edema taken to cath where was found to have severe 3V disease unable to revascularize. Suspect distal RCA is culprit lesion but hs trop > 27K -Cath 10/11/2020: EF 15-20%, LVEDP 29, severe LAD  disease w/ collaterals form D1 and small diagonal branches, CX moderate size w/ OM1 occluded at ostium, RCA mod disease in proximal and mid segment subtotall distall No targets for revascularization. - no options for revascularization - no s/s angina  - Continue DAPT/statin - Refer to CR  3. AKI on CKDwith hyperkalemia - Baseline 1.2-1.4 - Recent Creatinine  2.2 -> 1.9 -> 2.1 Suspect ATN - Check labs today  4. DM uncontrolled - hx of DM foot wound, s/p amputation 3 toes - A1c 8.0% (down from 8.8% Aug 2021) - Now on Jardiance - management per PCP  5. PAD s/p R SFA occlusion requiring DES -Successful PTA and stenting of right SFA, prolonged procedure, difficult procedure with use of multiple balloons, guidewires and catheters. 100% stenosis reduced to 0% with implantation of 3 overlapping 6.0 x 120 x2; 6.0 x 100 mm Eluvia DES. - claudication resolved  Total time spent 45 minutes. Over half that time spent discussing above.   Glori Bickers, MD  11:52 PM

## 2020-12-09 MED ORDER — ENTRESTO 24-26 MG PO TABS: 2.0000 | ORAL_TABLET | Freq: Two times a day (BID) | ORAL | 0 refills | Status: DC

## 2020-12-10 ENCOUNTER — Other Ambulatory Visit: Payer: Self-pay

## 2020-12-10 MED ORDER — FUROSEMIDE 40 MG PO TABS
40.0000 mg | ORAL_TABLET | Freq: Every day | ORAL | 1 refills | Status: DC
Start: 2020-12-10 — End: 2021-01-02

## 2020-12-11 ENCOUNTER — Other Ambulatory Visit: Payer: Self-pay | Admitting: Student

## 2020-12-11 DIAGNOSIS — I5022 Chronic systolic (congestive) heart failure: Secondary | ICD-10-CM

## 2020-12-11 MED ORDER — ENTRESTO 49-51 MG PO TABS: 1.0000 | ORAL_TABLET | Freq: Two times a day (BID) | ORAL | 3 refills | Status: DC

## 2020-12-11 NOTE — Progress Notes (Signed)
Patient has requested new prescription be sent for increased dose of Entresto.

## 2020-12-11 NOTE — Telephone Encounter (Signed)
From patient.

## 2020-12-12 ENCOUNTER — Other Ambulatory Visit: Payer: Self-pay | Admitting: Podiatry

## 2020-12-12 ENCOUNTER — Ambulatory Visit: Payer: Medicare Other | Admitting: Podiatry

## 2020-12-12 ENCOUNTER — Other Ambulatory Visit: Payer: Self-pay

## 2020-12-12 DIAGNOSIS — B351 Tinea unguium: Secondary | ICD-10-CM

## 2020-12-12 DIAGNOSIS — M79676 Pain in unspecified toe(s): Secondary | ICD-10-CM | POA: Diagnosis not present

## 2020-12-12 DIAGNOSIS — L97512 Non-pressure chronic ulcer of other part of right foot with fat layer exposed: Secondary | ICD-10-CM | POA: Diagnosis not present

## 2020-12-12 DIAGNOSIS — D689 Coagulation defect, unspecified: Secondary | ICD-10-CM

## 2020-12-12 DIAGNOSIS — L84 Corns and callosities: Secondary | ICD-10-CM

## 2020-12-12 DIAGNOSIS — E1140 Type 2 diabetes mellitus with diabetic neuropathy, unspecified: Secondary | ICD-10-CM | POA: Diagnosis not present

## 2020-12-12 MED ORDER — DOXYCYCLINE HYCLATE 100 MG PO TABS: 100.0000 mg | ORAL_TABLET | Freq: Two times a day (BID) | ORAL | 0 refills | Status: DC

## 2020-12-13 ENCOUNTER — Telehealth (HOSPITAL_COMMUNITY): Payer: Self-pay

## 2020-12-13 ENCOUNTER — Encounter (HOSPITAL_COMMUNITY): Payer: Self-pay

## 2020-12-13 NOTE — Telephone Encounter (Signed)
Pt insurance is active and benefits verified through [facility] Medicare. Co-pay $0.00, DED $0.00/$0.00 met, out of pocket $3,900.00/$60.00 met, co-insurance 0%. No pre-authorization required. Passport, 12/12/20 @ 10:25AM, ELT#[id number]  Will contact patient to see if he is interested in the Cardiac Rehab Program.

## 2020-12-13 NOTE — Telephone Encounter (Signed)
Attempted to call patient in regards to Cardiac Rehab - LM on VM Mailed letter 

## 2020-12-15 LAB — WOUND CULTURE
MICRO NUMBER:: 11492430
SPECIMEN QUALITY:: ADEQUATE

## 2020-12-15 LAB — HOUSE ACCOUNT TRACKING

## 2020-12-15 NOTE — Progress Notes (Signed)
Subjective: 65 year old male presents the office today for concerns of a callus on the bottom of his right foot and for thick, elongated toenails.he has not seen any swelling or redness or any drainage.  Since I last saw him he had a MI in December and he has retired. Denies any systemic complaints such as fevers, chills, nausea, vomiting. No acute changes since last appointment, and no other complaints at this time.   Objective: AAO x3, NAD DP/PT pulses palpable bilaterally, CRT less than 3 seconds Sensation decreased with Semmes Weinstein monofilament Hyperkeratotic lesion plantar aspect right lateral foot with what appears to be a blister underneath the callus.  There is dried blood also noted.  Upon debridement there is a granular wound measuring 2 x 0.6 x 0.2 cm.  Unable to measure the wound prior to debridement as the callus was covering the ulcer.  There is no probing to bone undermining or tunneling.  No surrounding erythema, ascending cellulitis.  No fluctuation or crepitation no malodor. Nails are hypertrophic, dystrophic, brittle, discolored, elongated 9 except the right fifth toe which is been amputated. No surrounding redness or drainage. Tenderness nails 1-5 bilaterally. Hammertoe contractures present. No pain with calf compression, swelling, warmth, erythema  Assessment: 65 year old male with right foot ulceration; symptomatic onychomycosis  Plan: -All treatment options discussed with the patient including all alternatives, risks, complications.  -Nails debrided x9 without complications or bleeding -Sharply debrided the hyperkeratotic lesion and to debride the wound utilizing a #312 with scalpel to debride nonviable devitalized tissue to help with wound healing.  Wounds debrided healthy, granular, bleeding wound base.  Minimal blood loss.  Tolerated the procedure well with any complications.  Recommend daily dressing changes.  The wound was cleansed today and Silvadene was applied  followed by dry sterile dressing.  Prescribed doxycycline.  Wound culture was obtained today. Offloading shoe dispensed. Monitor for any clinical signs or symptoms of infection and directed to call the office immediately should any occur or go to the ER.  *x-ray next appointment   Eduardo [name] DPM

## 2020-12-17 ENCOUNTER — Telehealth: Payer: Self-pay

## 2020-12-17 NOTE — Telephone Encounter (Signed)
Patient Eduardo [name] has been approved through 11/08/21

## 2020-12-19 ENCOUNTER — Ambulatory Visit (INDEPENDENT_AMBULATORY_CARE_PROVIDER_SITE_OTHER): Payer: Medicare Other

## 2020-12-19 ENCOUNTER — Ambulatory Visit: Payer: Medicare Other | Admitting: Podiatry

## 2020-12-19 ENCOUNTER — Other Ambulatory Visit: Payer: Self-pay

## 2020-12-19 DIAGNOSIS — L97512 Non-pressure chronic ulcer of other part of right foot with fat layer exposed: Secondary | ICD-10-CM

## 2020-12-20 ENCOUNTER — Other Ambulatory Visit: Payer: Self-pay

## 2020-12-20 DIAGNOSIS — I5022 Chronic systolic (congestive) heart failure: Secondary | ICD-10-CM

## 2020-12-20 LAB — BASIC METABOLIC PANEL WITH GFR
BUN/Creatinine Ratio: 26 — ABNORMAL HIGH (ref 10–24)
BUN: 47 mg/dL — ABNORMAL HIGH (ref 8–27)
CO2: 22 mmol/L (ref 20–29)
Calcium: 9.3 mg/dL (ref 8.6–10.2)
Chloride: 105 mmol/L (ref 96–106)
Creatinine, Ser: 1.82 mg/dL — ABNORMAL HIGH (ref 0.76–1.27)
GFR calc Af Amer: 44 mL/min/1.73 — ABNORMAL LOW
GFR calc non Af Amer: 38 mL/min/1.73 — ABNORMAL LOW
Glucose: 159 mg/dL — ABNORMAL HIGH (ref 65–99)
Potassium: 4.8 mmol/L (ref 3.5–5.2)
Sodium: 144 mmol/L (ref 134–144)

## 2020-12-20 MED ORDER — SACUBITRIL-VALSARTAN 24-26 MG PO TABS: 1.0000 | ORAL_TABLET | Freq: Two times a day (BID) | ORAL | Status: DC

## 2020-12-20 NOTE — Progress Notes (Signed)
Called pt regarding lab results. Pt voiced understanding and will be taking Entresto 24/26.

## 2020-12-20 NOTE — Progress Notes (Signed)
Please inform patient his kindey function has trended down since increasing Entresto. Advise him to reduce Entresto back to 24/26 mg twice daily and will repeat BMP after next office visit.  If patient has 49/51 mg tablets he can cut them in half.

## 2020-12-23 NOTE — Progress Notes (Signed)
Subjective: 65 year old male presents the office today for evaluation of wound on the right foot.  Overall he states that he is doing better.  Denies any drainage or pus or any swelling.  He has no pain associated with the wound.  No other concerns today. Denies any systemic complaints such as fevers, chills, nausea, vomiting. No acute changes since last appointment, and no other complaints at this time.   Objective: AAO x3, NAD DP/PT pulses palpable bilaterally, CRT less than 3 seconds On the right foot lateral aspect just proximal to amputation site hyperkeratotic lesion on the end of the wound.  Upon debridement appears of the wound is almost completely healed only small superficial areas is open.  There is no probing, [place] there is no surrounding erythema, ascending cellulitis.  No fluctuation crepitation, malodor. No pain with calf compression, swelling, warmth, erythema      Assessment: Improving ulceration right foot  Plan: -All treatment options discussed with the patient including all alternatives, risks, complications.  -X-rays obtained reviewed.  No evidence of acute fracture, osteomyelitis or soft tissue edema.  Previous partial fifth ray amputation.  Arthritis is present. -Patient debrided nonviable devitalized hyperkeratotic tissue debrided the wound and healthy, bleeding, granular tissue to promote wound healing utilizing the 312 with scalpel. -Continue daily dressing changes as well as offloading at all times. -Monitor for any clinical signs or symptoms of infection and directed to call the office immediately should any occur or go to the ER. -Patient encouraged to call the office with any questions, concerns, change in symptoms.   Trula Slade DPM

## 2020-12-28 ENCOUNTER — Other Ambulatory Visit: Payer: Self-pay

## 2020-12-28 ENCOUNTER — Inpatient Hospital Stay (HOSPITAL_COMMUNITY)
Admission: EM | Admit: 2020-12-28 | Discharge: 2021-01-02 | DRG: 216 | Disposition: A | Discharge status: Home / Self Care | Payer: Medicare Other | Attending: Cardiology | Admitting: Cardiology

## 2020-12-28 ENCOUNTER — Emergency Department (HOSPITAL_COMMUNITY): Payer: Medicare Other

## 2020-12-28 ENCOUNTER — Encounter (HOSPITAL_COMMUNITY): Payer: Self-pay | Admitting: Pharmacy Technician

## 2020-12-28 DIAGNOSIS — Z91013 Allergy to seafood: Secondary | ICD-10-CM

## 2020-12-28 DIAGNOSIS — Z87891 Personal history of nicotine dependence: Secondary | ICD-10-CM

## 2020-12-28 DIAGNOSIS — I5043 Acute on chronic combined systolic (congestive) and diastolic (congestive) heart failure: Secondary | ICD-10-CM | POA: Diagnosis present

## 2020-12-28 DIAGNOSIS — J810 Acute pulmonary edema: Secondary | ICD-10-CM

## 2020-12-28 DIAGNOSIS — Z91048 Other nonmedicinal substance allergy status: Secondary | ICD-10-CM

## 2020-12-28 DIAGNOSIS — J9601 Acute respiratory failure with hypoxia: Secondary | ICD-10-CM | POA: Diagnosis present

## 2020-12-28 DIAGNOSIS — Z79899 Other long term (current) drug therapy: Secondary | ICD-10-CM

## 2020-12-28 DIAGNOSIS — Z833 Family history of diabetes mellitus: Secondary | ICD-10-CM | POA: Diagnosis not present

## 2020-12-28 DIAGNOSIS — I130 Hypertensive heart and chronic kidney disease with heart failure and stage 1 through stage 4 chronic kidney disease, or unspecified chronic kidney disease: Secondary | ICD-10-CM | POA: Diagnosis present

## 2020-12-28 DIAGNOSIS — Z7984 Long term (current) use of oral hypoglycemic drugs: Secondary | ICD-10-CM

## 2020-12-28 DIAGNOSIS — Z888 Allergy status to other drugs, medicaments and biological substances status: Secondary | ICD-10-CM

## 2020-12-28 DIAGNOSIS — M7989 Other specified soft tissue disorders: Secondary | ICD-10-CM | POA: Diagnosis present

## 2020-12-28 DIAGNOSIS — I255 Ischemic cardiomyopathy: Secondary | ICD-10-CM | POA: Diagnosis present

## 2020-12-28 DIAGNOSIS — I21A1 Myocardial infarction type 2: Secondary | ICD-10-CM | POA: Diagnosis present

## 2020-12-28 DIAGNOSIS — Z89421 Acquired absence of other right toe(s): Secondary | ICD-10-CM | POA: Diagnosis not present

## 2020-12-28 DIAGNOSIS — Z20822 Contact with and (suspected) exposure to covid-19: Secondary | ICD-10-CM | POA: Diagnosis present

## 2020-12-28 DIAGNOSIS — I447 Left bundle-branch block, unspecified: Secondary | ICD-10-CM | POA: Diagnosis present

## 2020-12-28 DIAGNOSIS — N141 Nephropathy induced by other drugs, medicaments and biological substances: Secondary | ICD-10-CM | POA: Diagnosis present

## 2020-12-28 DIAGNOSIS — N183 Chronic kidney disease, stage 3 unspecified: Secondary | ICD-10-CM | POA: Diagnosis present

## 2020-12-28 DIAGNOSIS — E1151 Type 2 diabetes mellitus with diabetic peripheral angiopathy without gangrene: Secondary | ICD-10-CM | POA: Diagnosis present

## 2020-12-28 DIAGNOSIS — I4510 Unspecified right bundle-branch block: Secondary | ICD-10-CM | POA: Diagnosis present

## 2020-12-28 DIAGNOSIS — Z66 Do not resuscitate: Secondary | ICD-10-CM | POA: Diagnosis present

## 2020-12-28 DIAGNOSIS — Z6841 Body Mass Index (BMI) 40.0 and over, adult: Secondary | ICD-10-CM | POA: Diagnosis not present

## 2020-12-28 DIAGNOSIS — E1140 Type 2 diabetes mellitus with diabetic neuropathy, unspecified: Secondary | ICD-10-CM | POA: Diagnosis present

## 2020-12-28 DIAGNOSIS — I252 Old myocardial infarction: Secondary | ICD-10-CM | POA: Diagnosis not present

## 2020-12-28 DIAGNOSIS — Z7982 Long term (current) use of aspirin: Secondary | ICD-10-CM

## 2020-12-28 DIAGNOSIS — I5021 Acute systolic (congestive) heart failure: Secondary | ICD-10-CM | POA: Diagnosis present

## 2020-12-28 DIAGNOSIS — Z86010 Personal history of colonic polyps: Secondary | ICD-10-CM

## 2020-12-28 DIAGNOSIS — I5022 Chronic systolic (congestive) heart failure: Secondary | ICD-10-CM

## 2020-12-28 DIAGNOSIS — I25110 Atherosclerotic heart disease of native coronary artery with unstable angina pectoris: Secondary | ICD-10-CM | POA: Diagnosis present

## 2020-12-28 DIAGNOSIS — Z91040 Latex allergy status: Secondary | ICD-10-CM

## 2020-12-28 DIAGNOSIS — Z7902 Long term (current) use of antithrombotics/antiplatelets: Secondary | ICD-10-CM

## 2020-12-28 DIAGNOSIS — Z8249 Family history of ischemic heart disease and other diseases of the circulatory system: Secondary | ICD-10-CM | POA: Diagnosis not present

## 2020-12-28 DIAGNOSIS — Z794 Long term (current) use of insulin: Secondary | ICD-10-CM

## 2020-12-28 LAB — RESP PANEL BY RT-PCR (FLU A&B, COVID) ARPGX2
Influenza A by PCR: NEGATIVE
Influenza B by PCR: NEGATIVE
SARS Coronavirus 2 by RT PCR: NEGATIVE

## 2020-12-28 LAB — TROPONIN I (HIGH SENSITIVITY)
Troponin I (High Sensitivity): 23 ng/L — ABNORMAL HIGH (ref <18)
Troponin I (High Sensitivity): 46 ng/L — ABNORMAL HIGH (ref <18)

## 2020-12-28 LAB — COMPREHENSIVE METABOLIC PANEL
ALT: 36 U/L (ref 0–44)
AST: 50 U/L — ABNORMAL HIGH (ref 15–41)
Albumin: 3.9 g/dL (ref 3.5–5.0)
Alkaline Phosphatase: 63 U/L (ref 38–126)
Anion gap: 11 (ref 5–15)
BUN: 42 mg/dL — ABNORMAL HIGH (ref 8–23)
CO2: 23 mmol/L (ref 22–32)
Calcium: 9.2 mg/dL (ref 8.9–10.3)
Chloride: 108 mmol/L (ref 98–111)
Creatinine, Ser: 1.71 mg/dL — ABNORMAL HIGH (ref 0.61–1.24)
GFR, Estimated: 44 mL/min — ABNORMAL LOW (ref >60)
Glucose, Bld: 195 mg/dL — ABNORMAL HIGH (ref 70–99)
Potassium: 4.2 mmol/L (ref 3.5–5.1)
Sodium: 142 mmol/L (ref 135–145)
Total Bilirubin: 0.8 mg/dL (ref 0.3–1.2)
Total Protein: 7 g/dL (ref 6.5–8.1)

## 2020-12-28 LAB — CBC
HCT: 52.9 % — ABNORMAL HIGH (ref 39.0–52.0)
Hemoglobin: 17.2 g/dL — ABNORMAL HIGH (ref 13.0–17.0)
MCH: 30.1 pg (ref 26.0–34.0)
MCHC: 32.5 g/dL (ref 30.0–36.0)
MCV: 92.5 fL (ref 80.0–100.0)
Platelets: 186 10*3/uL (ref 150–400)
RBC: 5.72 MIL/uL (ref 4.22–5.81)
RDW: 13.9 % (ref 11.5–15.5)
WBC: 9.9 10*3/uL (ref 4.0–10.5)
nRBC: 0 % (ref 0.0–0.2)

## 2020-12-28 LAB — GLUCOSE, CAPILLARY
Glucose-Capillary: 156 mg/dL — ABNORMAL HIGH (ref 70–99)
Glucose-Capillary: 165 mg/dL — ABNORMAL HIGH (ref 70–99)

## 2020-12-28 LAB — BRAIN NATRIURETIC PEPTIDE: B Natriuretic Peptide: 1146.7 pg/mL — ABNORMAL HIGH (ref 0.0–100.0)

## 2020-12-28 MED ORDER — ROSUVASTATIN CALCIUM 20 MG PO TABS
20.0000 mg | ORAL_TABLET | Freq: Every day | ORAL | Status: DC
  Administered 2020-12-29 – 2021-01-02 (×5): 20 mg via ORAL
  Filled 2020-12-28 (×5): qty 1

## 2020-12-28 MED ORDER — INSULIN ISOPHANE HUMAN 100 UNIT/ML KWIKPEN: 30.0000 [IU] | PEN_INJECTOR | SUBCUTANEOUS | Status: DC

## 2020-12-28 MED ORDER — SODIUM CHLORIDE 0.9% FLUSH
3.0000 mL | Freq: Two times a day (BID) | INTRAVENOUS | Status: DC
  Administered 2020-12-29 – 2021-01-02 (×7): 3 mL via INTRAVENOUS

## 2020-12-28 MED ORDER — CLOPIDOGREL BISULFATE 75 MG PO TABS
75.0000 mg | ORAL_TABLET | Freq: Every day | ORAL | Status: DC
  Administered 2020-12-29 – 2021-01-02 (×5): 75 mg via ORAL
  Filled 2020-12-28 (×5): qty 1

## 2020-12-28 MED ORDER — GEMFIBROZIL 600 MG PO TABS
600.0000 mg | ORAL_TABLET | Freq: Two times a day (BID) | ORAL | Status: DC
Start: 2020-12-28 — End: 2021-01-02
  Administered 2020-12-29 – 2021-01-02 (×8): 600 mg via ORAL
  Filled 2020-12-28 (×10): qty 1

## 2020-12-28 MED ORDER — SACUBITRIL-VALSARTAN 24-26 MG PO TABS
1.0000 | ORAL_TABLET | Freq: Two times a day (BID) | ORAL | Status: DC
  Administered 2020-12-28 – 2021-01-02 (×9): 1 via ORAL
  Filled 2020-12-28 (×9): qty 1

## 2020-12-28 MED ORDER — INSULIN ASPART 100 UNIT/ML ~~LOC~~ SOLN
0.0000 [IU] | Freq: Three times a day (TID) | SUBCUTANEOUS | Status: DC
  Administered 2020-12-28 – 2020-12-29 (×2): 3 [IU] via SUBCUTANEOUS

## 2020-12-28 MED ORDER — FUROSEMIDE 10 MG/ML IJ SOLN
60.0000 mg | Freq: Two times a day (BID) | INTRAMUSCULAR | Status: DC
  Administered 2020-12-28 – 2020-12-29 (×2): 60 mg via INTRAVENOUS
  Filled 2020-12-28 (×2): qty 6

## 2020-12-28 MED ORDER — SODIUM CHLORIDE 0.9 % IV SOLN: 250.0000 mL | INTRAVENOUS | Status: DC | PRN

## 2020-12-28 MED ORDER — HEPARIN SODIUM (PORCINE) 5000 UNIT/ML IJ SOLN
5000.0000 [IU] | Freq: Three times a day (TID) | INTRAMUSCULAR | Status: DC
  Administered 2020-12-28 – 2020-12-30 (×5): 5000 [IU] via SUBCUTANEOUS
  Filled 2020-12-28 (×5): qty 1

## 2020-12-28 MED ORDER — ONDANSETRON HCL 4 MG/2ML IJ SOLN: 4.0000 mg | Freq: Four times a day (QID) | INTRAMUSCULAR | Status: DC | PRN

## 2020-12-28 MED ORDER — INSULIN ASPART 100 UNIT/ML ~~LOC~~ SOLN
4.0000 [IU] | Freq: Three times a day (TID) | SUBCUTANEOUS | Status: DC
  Administered 2020-12-28 – 2020-12-30 (×5): 4 [IU] via SUBCUTANEOUS

## 2020-12-28 MED ORDER — INSULIN NPH (HUMAN) (ISOPHANE) 100 UNIT/ML ~~LOC~~ SUSP
30.0000 [IU] | Freq: Two times a day (BID) | SUBCUTANEOUS | Status: DC
  Administered 2020-12-28 – 2020-12-30 (×4): 30 [IU] via SUBCUTANEOUS
  Filled 2020-12-28: qty 10

## 2020-12-28 MED ORDER — FUROSEMIDE 10 MG/ML IJ SOLN
INTRAMUSCULAR | Status: AC
  Filled 2020-12-28: qty 8

## 2020-12-28 MED ORDER — FUROSEMIDE 10 MG/ML IJ SOLN
60.0000 mg | Freq: Once | INTRAMUSCULAR | Status: AC
  Administered 2020-12-28: 60 mg via INTRAVENOUS

## 2020-12-28 MED ORDER — EMPAGLIFLOZIN 10 MG PO TABS
10.0000 mg | ORAL_TABLET | Freq: Every day | ORAL | Status: DC
  Administered 2020-12-29 – 2021-01-02 (×5): 10 mg via ORAL
  Filled 2020-12-28 (×6): qty 1

## 2020-12-28 MED ORDER — SODIUM CHLORIDE 0.9% FLUSH: 3.0000 mL | INTRAVENOUS | Status: DC | PRN

## 2020-12-28 MED ORDER — ACETAMINOPHEN 325 MG PO TABS: 650.0000 mg | ORAL_TABLET | ORAL | Status: DC | PRN

## 2020-12-28 MED ORDER — ISOSORBIDE MONONITRATE ER 60 MG PO TB24
60.0000 mg | ORAL_TABLET | Freq: Every day | ORAL | Status: DC
  Administered 2020-12-29 – 2021-01-02 (×5): 60 mg via ORAL
  Filled 2020-12-28 (×5): qty 1

## 2020-12-28 MED ORDER — ASPIRIN 81 MG PO CHEW
81.0000 mg | CHEWABLE_TABLET | Freq: Every day | ORAL | Status: DC
  Administered 2020-12-29 – 2021-01-02 (×4): 81 mg via ORAL
  Filled 2020-12-28 (×4): qty 1

## 2020-12-28 MED ORDER — ACETAMINOPHEN 325 MG PO TABS: 650.0000 mg | ORAL_TABLET | Freq: Four times a day (QID) | ORAL | Status: DC | PRN

## 2020-12-28 MED ORDER — GLUCOSE 40 % PO GEL: 1.0000 | ORAL | Status: AC

## 2020-12-28 NOTE — ED Provider Notes (Signed)
[name] Provider Note   CSN: [id number] Arrival date & time: 12/28/20  1059     History Chief Complaint  Patient presents with  . Respiratory Distress    Eduardo [name] is a 65 y.o. male.  HPI 65 year old male history of coronary artery disease, CHF, hypertension, presents today with sudden onset of dyspnea 30 minutes prior to evaluation.  EMS was called.  He was at [place] to have his taxes done when he had a sudden onset of dyspnea.  He denies any associated chest pain.  He denies being recently ill.  There is a sudden onset but he has had some increasing peripheral edema of his legs and abdomen.  He denies fever, chills, productive cough and has had 3 Covid vaccines.    Past Medical History:  Diagnosis Date  . Chronic kidney disease    stage 3  . Colon polyp   . Coronary artery disease   . Diabetes mellitus 1987   under care of Dr. Chalmers Cater.  On insulin since 96 (off and on)  . Diabetic retinopathy   . Dupuytren contracture    R hand, s/p injection (Dr. Lenon Curt)  . Essential hypertension, benign   . Essential hypertension, benign 02/06/2019  . Frequency of urination and polyuria   . Hypertension   . Myocardial infarction [facility])    denies  . Neuromuscular disorder ([place])    Diabetic neuropathy  . Osteomyelitis ([place])    right foot  . Other testicular hypofunction   . Peripheral arterial disease ([place]) 10/28/2012  . Peritoneal abscess ([place]) 6/08   and buttock.  . Pneumonia   . Polydipsia   . Proteinuria   . Pure hyperglyceridemia   . Subacute osteomyelitis, right ankle and foot ([place])   . Wears glasses     Patient Active Problem List   Diagnosis Date Noted  . Acute pulmonary edema ([place]) 10/11/2020  . Non-ST elevation (NSTEMI) myocardial infarction ([place]) 10/11/2020  . Acute respiratory distress 10/11/2020  . Acute on chronic systolic heart failure ([place]) 10/11/2020  . Cardiogenic shock ([place])   . 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 Ischemic ulcer of lower leg due to atherosclerosis ([place]) 02/28/2019  . Essential hypertension, benign 02/06/2019  . Hypercholesteremia 02/06/2019  . CKD (chronic kidney disease) stage 3, GFR 30-59 ml/min (HCC) 10/06/2017  . Proteinuria 10/06/2017  . 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 3-vessel coronary artery disease 05/23/2017  . Abnormal nuclear stress test 09/06/2016  . PAD (peripheral artery disease) ([place]) 10/28/2012  . Morbid obesity with BMI of 40.0-44.9, adult ([place]) 10/13/2012  . Diabetes mellitus with ophthalmic complication ([place]) 85/12/7739  . Hypertension associated with diabetes ([place]) 10/13/2012  . Atherosclerosis of native artery of extremity with intermittent claudication ([place]) 04/05/2012  . Adenomatous colon polyp 09/28/2011  . Pure hyperglyceridemia 09/28/2011  . Erectile dysfunction 09/28/2011  . Diabetic retinopathy ([place]) 09/28/2011    Past Surgical History:  Procedure Laterality Date  . ABDOMINAL AORTAGRAM N/A 04/18/2012   Procedure: ABDOMINAL Maxcine Ham;  Surgeon: Angelia Mould, MD;  Location: [facility] CATH LAB;  Service: Cardiovascular;  Laterality: N/A;  . AMPUTATION Right 05/19/2019   Procedure: RIGHT FOOT 5TH Teegan Brandis AMPUTATION;  Surgeon: Newt Minion, MD;  Location: [facility];  Service: Orthopedics;  Laterality: Right;  . CARDIAC CATHETERIZATION N/A 09/08/2016   Procedure: Left Heart Cath and Coronary Angiography;  Surgeon: Adrian Prows, MD;  Location: [facility] CV LAB;  Service: Cardiovascular;  Laterality: N/A;  . CATARACT EXTRACTION, BILATERAL  09/2017, 10/2017   Dr. Herbert Deaner  . COLONOSCOPY W/ BIOPSIES AND POLYPECTOMY    . CORONARY/GRAFT ACUTE MI REVASCULARIZATION N/A 10/11/2020   Procedure: Coronary/Graft Acute MI Revascularization;  Surgeon: Adrian Prows, MD;  Location: [place] CV LAB;  Service: Cardiovascular;  Laterality: N/A;  . LEFT HEART CATH AND CORONARY ANGIOGRAPHY N/A 10/11/2020   Procedure: LEFT  HEART CATH AND CORONARY ANGIOGRAPHY;  Surgeon: Adrian Prows, MD;  Location: [facility] CV LAB;  Service: Cardiovascular;  Laterality: N/A;  . LOWER EXTREMITY ANGIOGRAM Bilateral 04/18/2012   Procedure: LOWER EXTREMITY ANGIOGRAM;  Surgeon: Angelia Mould, MD;  Location: [facility] CATH LAB;  Service: Cardiovascular;  Laterality: Bilateral;  bilat lower extrem angio  . LOWER EXTREMITY ANGIOGRAPHY Bilateral 05/02/2019   Procedure: LOWER EXTREMITY ANGIOGRAPHY;  Surgeon: Adrian Prows, MD;  Location: [place] CV LAB;  Service: Cardiovascular;  Laterality: Bilateral;  . LOWER EXTREMITY ANGIOGRAPHY Bilateral 02/28/2019   Procedure: LOWER EXTREMITY ANGIOGRAPHY;  Surgeon: Adrian Prows, MD;  Location: [facility] CV LAB;  Service: Cardiovascular;  Laterality: Bilateral;  . macular photocoagulation     (eye treatments for diabetic retinopathy)-Dr. Zigmund Daniel  . PERIPHERAL VASCULAR INTERVENTION  02/28/2019   Procedure: PERIPHERAL VASCULAR INTERVENTION;  Surgeon: Adrian Prows, MD;  Location: [place] CV LAB;  Service: Cardiovascular;;       Family History  Problem Relation Age of Onset  . Diabetes Mother   . Hearing loss Mother   . Hypertension Mother   . Hyperlipidemia Mother   . Heart disease Mother   . Varicose Veins Mother   . Varicose Veins Father   . Dementia Father   . Hyperlipidemia Brother   . Diabetes Maternal Grandmother     Social History   Tobacco Use  . Smoking status: Former Smoker    Packs/day: 1.00    Years: 30.00    Pack years: 30.00    Types: Cigarettes    Quit date: 01/08/2012    Years since quitting: 8.9  . Smokeless tobacco: Never Used  Vaping Use  . Vaping Use: Never used  Substance Use Topics  . Alcohol use: Yes    Comment: rare  . Drug use: No    Home Medications Prior to Admission medications   Medication Sig Start Date End Date Taking? Authorizing Provider  acetaminophen (TYLENOL) 325 MG tablet Take 650 mg by mouth every 6 (six) hours as needed for headache  (pain).    [provider]  aspirin 81 MG chewable tablet Chew 1 tablet (81 mg total) by mouth daily. 10/16/20   Adrian Prows, MD  B-D UF III MINI PEN NEEDLES 31G X 5 MM MISC U UTD 6 TIMES A DAY 06/29/18   [provider]  Bioflavonoid Products (ESTER C PO) Take 1,000 mg by mouth daily.    [provider]  carvedilol (COREG) 12.5 MG tablet Take 1 tablet (12.5 mg total) by mouth 2 (two) times daily with a meal. 10/21/20   Adrian Prows, MD  Cholecalciferol (VITAMIN D) 2000 units CAPS Take 2,000 Units by mouth daily.    [provider]  clopidogrel (PLAVIX) 75 MG tablet Take 1 tablet (75 mg total) by mouth daily. 11/05/20   Adrian Prows, MD  Continuous Blood Gluc Sensor (FREESTYLE LIBRE 14 DAY SENSOR) MISC USE UTD Q 14 DAYS SUBCUTANEOUS 04/14/19   [provider]  doxycycline (VIBRA-TABS) 100 MG tablet Take 1 tablet (100 mg total) by mouth 2 (two) times  daily. 12/12/20   Trula Slade, DPM  empagliflozin (JARDIANCE) 10 MG TABS tablet Take 1 tablet (10 mg total) by mouth daily. 10/21/20   Adrian Prows, MD  furosemide (LASIX) 40 MG tablet Take 1 tablet (40 mg total) by mouth daily. 12/10/20   Cantwell, Celeste C, PA-C  gemfibrozil (LOPID) 600 MG tablet Take 600 mg by mouth 2 (two) times daily before a meal.    [provider]  HUMALOG KWIKPEN 100 UNIT/ML KiwkPen Inject 0-20 Units into the skin 3 (three) times daily before meals. Sliding scale based on CBG 08/02/16   [provider]  HUMULIN N KWIKPEN 100 UNIT/ML Kiwkpen Inject 30-40 Units into the skin See admin instructions. Inject 30-40 units with breakfast and supper; inject 10-20 units with lunch if needed for high blood sugar 08/03/16   [provider]  isosorbide mononitrate (IMDUR) 60 MG 24 hr tablet Take 1 tablet (60 mg total) by mouth daily. 11/13/20   Adrian Prows, MD  Multiple Vitamin (MULTIVITAMIN WITH MINERALS) TABS tablet Take 1 tablet by mouth daily.    [provider]   nitroGLYCERIN (NITROSTAT) 0.4 MG SL tablet Place 0.4 mg under the tongue every 5 (five) minutes as needed for chest pain. 08/24/16   [provider]  omega-3 acid ethyl esters (LOVAZA) 1 g capsule Take 350 mg by mouth 2 (two) times daily.    [provider]  Probiotic Product (PROBIOTIC-10 PO) Take 1 capsule by mouth daily.    [provider]  rosuvastatin (CRESTOR) 20 MG tablet Take 1 tablet (20 mg total) by mouth daily. 07/22/20   Adrian Prows, MD  sodium zirconium cyclosilicate (LOKELMA) 10 g PACK packet Take 10 g by mouth daily. 11/21/20   Adrian Prows, MD    Allergies    Food and Testosterone  Review of Systems   Review of Systems  All other systems reviewed and are negative.   Physical Exam Updated Vital Signs BP (!) 145/79   Pulse 93   Resp (!) 28   SpO2 97%   Physical Exam Vitals and nursing note reviewed.  Constitutional:      General: He is in acute distress.     Appearance: He is obese.  HENT:     Head: Normocephalic.     Right Ear: External ear normal.     Left Ear: External ear normal.     Nose: Nose normal.     Mouth/Throat:     Pharynx: Oropharynx is clear.  Eyes:     Extraocular Movements: Extraocular movements intact.     Pupils: Pupils are equal, round, and reactive to light.  Cardiovascular:     Rate and Rhythm: Normal rate and regular rhythm.     Pulses: Normal pulses.     Heart sounds: Normal heart sounds.  Pulmonary:     Breath sounds: Rhonchi present.     Comments: Bibasilar rhonchi noted Abdominal:     General: There is distension.  Musculoskeletal:        General: Swelling present. Normal range of motion.     Cervical back: Normal range of motion.     Comments: Bilateral pitting edema to knees  Skin:    General: Skin is warm.     Capillary Refill: Capillary refill takes less than 2 seconds.  Neurological:     General: No focal deficit present.     Mental Status: He is alert.  Psychiatric:        Mood and Affect:  Mood  normal.     ED Results / Procedures / Treatments   Labs (all labs ordered are listed, but only abnormal results are displayed) Labs Reviewed  CBC  COMPREHENSIVE METABOLIC PANEL  BRAIN NATRIURETIC PEPTIDE    EKG EKG Interpretation  Date/Time:  Saturday December 28 2020 11:04:24 EST Ventricular Rate:  93 PR Interval:    QRS Duration: 144 QT Interval:  402 QTC Calculation: 500 R Axis:   -30 Text Interpretation: Sinus arrhythmia Multiform ventricular premature complexes Borderline prolonged PR interval Left bundle branch block lbbb new from first prior ekg of 10/13/20 Confirmed by Pattricia Boss [phone]) on 12/28/2020 11:10:04 AM   Radiology DG Chest Port 1 View  Result Date: 12/28/2020 CLINICAL DATA:  Dyspnea. EXAM: PORTABLE CHEST 1 VIEW COMPARISON:  10/12/2020 FINDINGS: Normal cardiomediastinal contours. Diffusely increased interstitial markings are identified bilaterally compatible with interstitial edema. No focal airspace opacities. Visualized osseous structures appear intact. IMPRESSION: Mild diffuse edema. Electronically Signed   By: Kerby Moors M.D.   On: 12/28/2020 11:42    Procedures .Critical Care Performed by: Pattricia Boss, MD Authorized by: Pattricia Boss, MD   Critical care provider statement:    Critical care time (minutes):  45   Critical care end time:  12/28/2020 1:11 PM   Critical care was necessary to treat or prevent imminent or life-threatening deterioration of the following conditions:  Respiratory failure   Critical care was time spent personally by me on the following activities:  Discussions with consultants, evaluation of patient's response to treatment, examination of patient, ordering and performing treatments and interventions, ordering and review of laboratory studies, ordering and review of radiographic studies, pulse oximetry, re-evaluation of patient's condition, obtaining history from patient or surrogate and review of old charts      Medications Ordered in ED Medications  furosemide (LASIX) 10 MG/ML injection (has no administration in time range)  furosemide (LASIX) injection 60 mg (60 mg Intravenous Given 12/28/20 1108)    ED Course  I have reviewed the triage vital signs and the nursing notes.  Pertinent labs & imaging results that were available during my care of the patient were reviewed by me and considered in my medical decision making (see chart for details). 11:58 AM Patient imroved and will try to wean from bipap Discussed with Dr. Virgina Jock who will see for admission   MDM Rules/Calculators/A&P                           Final Clinical Impression(s) / ED Diagnoses Final diagnoses:  Acute respiratory failure with hypoxia ([place])  Acute pulmonary edema [facility])    Rx / DC Orders ED Discharge Orders    None       Pattricia Boss, MD 12/28/20 1311

## 2020-12-28 NOTE — Progress Notes (Signed)
Placed patient on BiPAP via Servo I due to respiratory distress, patient condition improved with SATS of 95%, will continue to monitor patient.

## 2020-12-28 NOTE — H&P (Addendum)
Eduardo [name] is an 65 y.o. male.   Chief Complaint: Shortness of breath HPI:   65 y.o. Caucasian male  with hypertension, type 2 diabetes mellitus, multivessel coronary artery disease, ischemic cardiomyopathy with systolic heart failure EF 20-25%, PAD, admitted with acute on chronic systolic heart failure.  Patient had new onset (branch block and acute coronary syndrome, with troponin HS > 27,000 in December.  Echocardiogram at that time showed acute systolic heart failure with severe global hypokinesis, EF 20-25%.  Coronary angiogram showed severe multivessel disease with no adequate targets for revascularization, either percutaneous or surgical.  He was started on guideline directed medical management for heart failure.  He was also evaluated for advanced heart failure management options.  He was deemed not to be a candidate for heart transplant.  Patient opted not to pursue VAD option.  He has been doing fairly well on medical therapy.  However, for last 2 days, he noticed increasing leg swelling.  Therefore, he will increase his p.o. Lasix from 40 mg daily to 80 mg daily.  His Entresto was increased from 24-26 mg twice daily to 49-51 mg twice daily.  However, with increasing creatinine from 1.5-1.8, this was decreased back down to 24-26 mg. Earlier today, he was on his way to do his taxes, when he is developed sudden onset shortness of breath and "indigestion" feeling.  His symptoms are similar to his presentation back in December 2021, although much lesser in intensity.  EMS were called. In the ED, he was placed on BiPAP.  EKG showed sinus rhythm, 60-80 bpm, left bundle branch block.  Most recent prior EKG had showed right bundle branch block with left anterior fascicle block in the ED, received 60 mg of IV Lasix.  While he is not put out any urine through condom catheter, he feels much better with his breathing.  He is now comfortable on 4 L of oxygen and does not have any chest pain/indigestion  symptoms.  I detailed conversation with the patient regarding goals of care, details below.  Past Medical History:  Diagnosis Date  . Chronic kidney disease    stage 3  . Colon polyp   . Coronary artery disease   . Diabetes mellitus 1987   under care of Dr. Chalmers Cater.  On insulin since 96 (off and on)  . Diabetic retinopathy   . Dupuytren contracture    R hand, s/p injection (Dr. Lenon Curt)  . Essential hypertension, benign   . Essential hypertension, benign 02/06/2019  . Frequency of urination and polyuria   . Hypertension   . Myocardial infarction [facility])    denies  . Neuromuscular disorder ([place])    Diabetic neuropathy  . Osteomyelitis ([place])    right foot  . Other testicular hypofunction   . Peripheral arterial disease ([place]) 10/28/2012  . Peritoneal abscess ([place]) 6/08   and buttock.  . Pneumonia   . Polydipsia   . Proteinuria   . Pure hyperglyceridemia   . Subacute osteomyelitis, right ankle and foot ([place])   . Wears glasses     Past Surgical History:  Procedure Laterality Date  . ABDOMINAL AORTAGRAM N/A 04/18/2012   Procedure: ABDOMINAL Maxcine Ham;  Surgeon: Angelia Mould, MD;  Location: [facility] CATH LAB;  Service: Cardiovascular;  Laterality: N/A;  . AMPUTATION Right 05/19/2019   Procedure: RIGHT FOOT 5TH RAY AMPUTATION;  Surgeon: Newt Minion, MD;  Location: [place];  Service: Orthopedics;  Laterality: Right;  . CARDIAC CATHETERIZATION N/A 09/08/2016   Procedure: Left  Heart Cath and Coronary Angiography;  Surgeon: Adrian Prows, MD;  Location: [place] CV LAB;  Service: Cardiovascular;  Laterality: N/A;  . CATARACT EXTRACTION, BILATERAL  09/2017, 10/2017   Dr. Herbert Deaner  . COLONOSCOPY W/ BIOPSIES AND POLYPECTOMY    . CORONARY/GRAFT ACUTE MI REVASCULARIZATION N/A 10/11/2020   Procedure: Coronary/Graft Acute MI Revascularization;  Surgeon: Adrian Prows, MD;  Location: [place] CV LAB;  Service: Cardiovascular;  Laterality: N/A;  . LEFT HEART CATH AND CORONARY ANGIOGRAPHY N/A  10/11/2020   Procedure: LEFT HEART CATH AND CORONARY ANGIOGRAPHY;  Surgeon: Adrian Prows, MD;  Location: [place] CV LAB;  Service: Cardiovascular;  Laterality: N/A;  . LOWER EXTREMITY ANGIOGRAM Bilateral 04/18/2012   Procedure: LOWER EXTREMITY ANGIOGRAM;  Surgeon: Angelia Mould, MD;  Location: [facility] CATH LAB;  Service: Cardiovascular;  Laterality: Bilateral;  bilat lower extrem angio  . LOWER EXTREMITY ANGIOGRAPHY Bilateral 05/02/2019   Procedure: LOWER EXTREMITY ANGIOGRAPHY;  Surgeon: Adrian Prows, MD;  Location: [place] CV LAB;  Service: Cardiovascular;  Laterality: Bilateral;  . LOWER EXTREMITY ANGIOGRAPHY Bilateral 02/28/2019   Procedure: LOWER EXTREMITY ANGIOGRAPHY;  Surgeon: Adrian Prows, MD;  Location: [place] CV LAB;  Service: Cardiovascular;  Laterality: Bilateral;  . macular photocoagulation     (eye treatments for diabetic retinopathy)-Dr. Zigmund Daniel  . PERIPHERAL VASCULAR INTERVENTION  02/28/2019   Procedure: PERIPHERAL VASCULAR INTERVENTION;  Surgeon: Adrian Prows, MD;  Location: [place] CV LAB;  Service: Cardiovascular;;     Family History  Problem Relation Age of Onset  . Diabetes Mother   . Hearing loss Mother   . Hypertension Mother   . Hyperlipidemia Mother   . Heart disease Mother   . Varicose Veins Mother   . Varicose Veins Father   . Dementia Father   . Hyperlipidemia Brother   . Diabetes Maternal Grandmother     Social History:  reports that he quit smoking about 8 years ago. His smoking use included cigarettes. He has a 30.00 pack-year smoking history. He has never used smokeless tobacco. He reports current alcohol use. He reports that he does not use drugs.  Allergies:  Allergies  Allergen Reactions  . Shellfish-Derived Products Nausea And Vomiting and Other (See Comments)    Mussels cause severe nausea and vomiting   . Latex Rash  . Tape Rash and Other (See Comments)    Caused issues with the skin  . Testosterone Rash    Review of Systems   Constitutional: Negative for decreased appetite, malaise/fatigue, weight gain and weight loss.  HENT: Negative for congestion.   Eyes: Negative for visual disturbance.  Cardiovascular: Positive for dyspnea on exertion and leg swelling. Negative for chest pain, palpitations and syncope.  Respiratory: Positive for shortness of breath. Negative for cough.   Endocrine: Negative for cold intolerance.  Hematologic/Lymphatic: Does not bruise/bleed easily.  Skin: Negative for itching and rash.  Musculoskeletal: Negative for myalgias.  Gastrointestinal: Negative for abdominal pain, nausea and vomiting.       Indigestion, now resolved  Genitourinary: Negative for dysuria.  Neurological: Negative for dizziness and weakness.  Psychiatric/Behavioral: The patient is not nervous/anxious.   All other systems reviewed and are negative.    Blood pressure (!) 86/48, pulse 67, resp. rate 17, height 5\' 10"  (1.778 m), SpO2 100 %. Body mass index is 43.59 kg/m.  Physical Exam Vitals and nursing note reviewed.  Constitutional:      General: He is not in acute distress.    Appearance: He is well-developed.  HENT:  Head: Normocephalic and atraumatic.  Eyes:     Conjunctiva/sclera: Conjunctivae normal.     Pupils: Pupils are equal, round, and reactive to light.  Neck:     Vascular: JVD present.  Cardiovascular:     Rate and Rhythm: Normal rate and regular rhythm.     Pulses: Intact distal pulses.          Femoral pulses are 2+ on the right side and 1+ on the left side.      Dorsalis pedis pulses are 0 on the right side and 0 on the left side.       Posterior tibial pulses are 0 on the right side and 0 on the left side.     Heart sounds: Murmur heard.  High-pitched blowing holosystolic murmur is present with a grade of 2/6 at the apex.     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 Healed callous on medial aspect os Rt sole Pulmonary:     Effort: Pulmonary effort is normal.     Breath sounds: Rales  (RLL) present. No wheezing.  Abdominal:     General: Bowel sounds are normal.     Palpations: Abdomen is soft.     Tenderness: There is no rebound.  Musculoskeletal:        General: No tenderness. Normal range of motion.     Right lower leg: Edema (2+) present.     Left lower leg: Edema (2+) present.  Lymphadenopathy:     Cervical: No cervical adenopathy.  Skin:    General: Skin is warm and dry.  Neurological:     Mental Status: He is alert and oriented to person, place, and time.     Cranial Nerves: No cranial nerve deficit.     Results for orders placed or performed during the hospital encounter of 12/28/20 (from the past 48 hour(s))  CBC     Status: Abnormal   Collection Time: 12/28/20 11:08 AM  Result Value Ref Range   WBC 9.9 4.0 - 10.5 K/uL   RBC 5.72 4.22 - 5.81 MIL/uL   Hemoglobin 17.2 (H) 13.0 - 17.0 g/dL   HCT 52.9 (H) 39.0 - 52.0 %   MCV 92.5 80.0 - 100.0 fL   MCH 30.1 26.0 - 34.0 pg   MCHC 32.5 30.0 - 36.0 g/dL   RDW 13.9 11.5 - 15.5 %   Platelets 186 150 - 400 K/uL   nRBC 0.0 0.0 - 0.2 %    Comment: Performed at [facility], [place] [address] [place], [place] [address]  Comprehensive metabolic panel     Status: Abnormal   Collection Time: 12/28/20 11:08 AM  Result Value Ref Range   Sodium 142 135 - 145 mmol/L   Potassium 4.2 3.5 - 5.1 mmol/L   Chloride 108 98 - 111 mmol/L   CO2 23 22 - 32 mmol/L   Glucose, Bld 195 (H) 70 - 99 mg/dL    Comment: Glucose reference range applies only to samples taken after fasting for at least 8 hours.   BUN 42 (H) 8 - 23 mg/dL   Creatinine, Ser 1.71 (H) 0.61 - 1.24 mg/dL   Calcium 9.2 8.9 - 10.3 mg/dL   Total Protein 7.0 6.5 - 8.1 g/dL   Albumin 3.9 3.5 - 5.0 g/dL   AST 50 (H) 15 - 41 U/L   ALT 36 0 - 44 U/L   Alkaline Phosphatase 63 38 - 126 U/L   Total Bilirubin 0.8 0.3 - 1.2 mg/dL  GFR, Estimated 44 (L) >60 mL/min    Comment: (NOTE) Calculated using the CKD-EPI Creatinine Equation (2021)    Anion gap 11 5 -  15    Comment: Performed at [facility], [place] [address], [place], [place] [address]  Brain natriuretic peptide     Status: Abnormal   Collection Time: 12/28/20 11:08 AM  Result Value Ref Range   B Natriuretic Peptide 1,146.7 (H) 0.0 - 100.0 pg/mL    Comment: Performed at [place] [address] [place], [place] [address]  Troponin I (High Sensitivity)     Status: Abnormal   Collection Time: 12/28/20 11:08 AM  Result Value Ref Range   Troponin I (High Sensitivity) 23 (H) <18 ng/L    Comment: (NOTE) Elevated high sensitivity troponin I (hsTnI) values and significant  changes across serial measurements may suggest ACS but many other  chronic and acute conditions are known to elevate hsTnI results.  Refer to the "Links" section for chest pain algorithms and additional  guidance. Performed at [facility], [place] [address], [place], [place] [address]     Labs:   Lab Results  Component Value Date   WBC 9.9 12/28/2020   HGB 17.2 (H) 12/28/2020   HCT 52.9 (H) 12/28/2020   MCV 92.5 12/28/2020   PLT 186 12/28/2020    Recent Labs  Lab 12/28/20 1108  NA 142  K 4.2  CL 108  CO2 23  BUN 42*  CREATININE 1.71*  CALCIUM 9.2  PROT 7.0  BILITOT 0.8  ALKPHOS 63  ALT 36  AST 50*  GLUCOSE 195*    Lipid Panel     Component Value Date/Time   CHOL 137 06/24/2020 0937   TRIG 144 10/12/2020 0511   HDL 33 (L) 06/24/2020 0937   CHOLHDL 4.2 06/24/2020 0937   CHOLHDL 4.4 09/28/2011 1358   VLDL 21 09/28/2011 1358   LDLCALC 72 06/24/2020 0937    BNP (last 3 results) Recent Labs    10/22/20 1605 11/12/20 1350 12/28/20 1108  BNP 460.7* 630.2* 1,146.7*    HEMOGLOBIN A1C Lab Results  Component Value Date   HGBA1C 6.7 11/19/2020   MPG 182.9 10/11/2020    Cardiac Panel (last 3 results) No results for input(s): CKTOTAL, CKMB, RELINDX in the last 8760 hours.  Invalid input(s): TROPONINHS  No results found for: CKTOTAL, CKMB, CKMBINDEX   TSH Recent  Labs    06/24/20 0937  TSH 1.650     (Not in a hospital admission)     Current Facility-Administered Medications:  .  furosemide (LASIX) 10 MG/ML injection, , , ,  .  sacubitril-valsartan (ENTRESTO) 24-26 mg per tablet, 1 tablet, Oral, BID, Cantwell, Celeste C, PA-C  Current Outpatient Medications:  .  carvedilol (COREG) 6.25 MG tablet, Take 6.25 mg by mouth 2 (two) times daily with a meal., Disp: , Rfl:  .  clopidogrel (PLAVIX) 75 MG tablet, Take 1 tablet (75 mg total) by mouth daily., Disp: 90 tablet, Rfl: 1 .  Continuous Blood Gluc Sensor (FREESTYLE LIBRE 14 DAY SENSOR) MISC, every 14 (fourteen) days., Disp: , Rfl:  .  empagliflozin (JARDIANCE) 10 MG TABS tablet, Take 1 tablet (10 mg total) by mouth daily., Disp: 90 tablet, Rfl: 3 .  furosemide (LASIX) 40 MG tablet, Take 1 tablet (40 mg total) by mouth daily. (Patient taking differently: Take 40-80 mg by mouth See admin instructions. Take 80 mg by mouth in the morning when retaining fluid and decrease to  40 mg when resolved), Disp: 30 tablet, Rfl: 1 .  gemfibrozil (LOPID) 600 MG tablet, Take 600 mg by mouth 2 (two) times daily before a meal., Disp: , Rfl:  .  HUMALOG KWIKPEN 100 UNIT/ML KiwkPen, Inject 0-10 Units into the skin See admin instructions. Inject 0-10 units into the skin three times a day, per sliding scale- based on BGL >100, Disp: , Rfl: 1 .  HUMULIN N KWIKPEN 100 UNIT/ML Kiwkpen, Inject 30 Units into the skin See admin instructions. Inject 30 units into the skin before breakfast and supper, Disp: , Rfl: 1 .  isosorbide mononitrate (IMDUR) 60 MG 24 hr tablet, Take 1 tablet (60 mg total) by mouth daily., Disp: 90 tablet, Rfl: 1 .  Multiple Vitamin (MULTIVITAMIN WITH MINERALS) TABS tablet, Take 1 tablet by mouth daily., Disp: , Rfl:  .  nitroGLYCERIN (NITROSTAT) 0.4 MG SL tablet, Place 0.4 mg under the tongue every 5 (five) minutes as needed for chest pain., Disp: , Rfl: 1 .  Probiotic Product (PROBIOTIC-10 PO), Take 1  capsule by mouth daily., Disp: , Rfl:  .  rosuvastatin (CRESTOR) 20 MG tablet, Take 1 tablet (20 mg total) by mouth daily., Disp: 90 tablet, Rfl: 3 .  sacubitril-valsartan (ENTRESTO) 49-51 MG, Take 0.5 tablets by mouth 2 (two) times daily., Disp: , Rfl:  .  sodium zirconium cyclosilicate (LOKELMA) 10 g PACK packet, Take 10 g by mouth daily., Disp: 33 each, Rfl: 1 .  acetaminophen (TYLENOL) 325 MG tablet, Take 650 mg by mouth every 6 (six) hours as needed for headache (pain)., Disp: , Rfl:  .  aspirin 81 MG chewable tablet, Chew 1 tablet (81 mg total) by mouth daily., Disp: , Rfl:  .  B-D UF III MINI PEN NEEDLES 31G X 5 MM MISC, 6 (six) times daily., Disp: , Rfl: 0 .  Bioflavonoid Products (ESTER C PO), Take 1,000 mg by mouth daily., Disp: , Rfl:  .  carvedilol (COREG) 12.5 MG tablet, Take 1 tablet (12.5 mg total) by mouth 2 (two) times daily with a meal. (Patient not taking: No sig reported), Disp: 180 tablet, Rfl: 3 .  Cholecalciferol (VITAMIN D) 2000 units CAPS, Take 2,000 Units by mouth daily., Disp: , Rfl:  .  doxycycline (VIBRA-TABS) 100 MG tablet, Take 1 tablet (100 mg total) by mouth 2 (two) times daily. (Patient not taking: No sig reported), Disp: 20 tablet, Rfl: 0   Today's Vitals   12/28/20 1114 12/28/20 1115 12/28/20 1130 12/28/20 1200  BP:  101/68 104/61 (!) 86/48  Pulse:  80 72 67  Resp:  19 17   SpO2: 100% 99% 100% 100%  Height: 5\' 10"  (1.778 m)     PainSc:       Body mass index is 43.59 kg/m.     CARDIAC STUDIES:  EKG 12/28/2020: Sinus rhythm Left bundle branch block  Echocardiogram 10/12/2020: 1. Not well visualized, LVH degree unable to be assessed due to poor echo  window. . Left ventricular ejection fraction, by estimation, is 20 to 25%.  The left ventricle has severely decreased function. The left ventricle  demonstrates regional wall motion  abnormalities (see scoring diagram/findings for description). Left  ventricular diastolic parameters are consistent  with Grade I diastolic  dysfunction (impaired relaxation).  2. Right ventricular systolic function is normal. The right ventricular  size is normal.  3. The mitral valve is normal in structure. Trivial mitral valve  regurgitation. No evidence of mitral stenosis.  4. The aortic valve is normal in  structure. Aortic valve regurgitation is  not visualized. No aortic stenosis is present.  5. The inferior vena cava is dilated in size with <50% respiratory  variability, suggesting right atrial pressure of 15 mmHg.   Left Heart Catheterization 10/11/20:  LV: Severely dilated.  Global hypokinesis.  Hand contrast injection hence not fully adequately visualized.  LVEF 15 to 20%.  EDP markedly elevated at 29 mmHg.  No pressure gradient across the aortic valve. Left main: Normal. LAD: Severely diffusely diseased.  Gives origin to large D1 which gives collaterals to the LAD and 2 smaller diagonals, LAD is occluded after the origin of D1, anatomy compared to prior in 2017 reveals progression of diffuse disease.  There are ipsilateral and contralateral collaterals to the LAD. CX: Moderate sized vessel, giving origin to large OM1.  OM1 is occluded in the ostium as was noted previously and has ipsilateral collaterals.  OM1 is diffusely diseased and faintly filled.  RCA: Moderate disease in the proximal and mid segment.  At the bifurcation of PDA and PL, there is a high-grade 90% stenosis.  The bifurcation is also involved with at least a 90% stenosis in the PDA and a 60 to 70% stenosis in the PL branch.  There is no target as distally as the PL branch which is large is occluded distally that was also noted in 2017.  There are faint collaterals noted from the left to the RCA.  Impression: Severe native vessel three-vessel coronary artery disease with no significant targets for revascularization, LAD may be amenable for revascularization but I am not sure that this would help.  Patient is extremely ill with high  risk for mortality.  He also has underlying stage III-IV kidney disease and contrast nephropathy needs to be evaluated further in view of contrast load of 70 mL. I try to reach his friend on the contact list, unable to reach.   Assessment/Plan  65 y.o. Caucasian male  with hypertension, type 2 diabetes mellitus, multivessel coronary artery disease, ischemic cardiomyopathy with systolic heart failure EF 20-25%, PAD, admitted with acute on chronic systolic heart failure.  Acute on chronic systolic heart failure: Ischemic cardiomyopathy, NYHA class IV.   Currently comfortable on 4 L nasal oxygen Recommend IV Lasix 60 mg twice daily. Hold carvedilol due to acute decompensation at this time. Continue Entresto 24/26 mg twice daily. Subject to response to Lasix and renal function, will consider adding spironolactone.  Troponin elevation: High sensitive troponin is elevated at 23.  While this could be secondary to acute heart failure, his presentation was somewhat similar to his acute coronary syndrome presentation in December 2021. Cycle troponin. Continue aspirin, Plavix. Based on his coronary angiography in December 2021, he does not have any adequate targets for percutaneous or surgical revascularization.  Therefore, continue medical management at this time.  Goals of care: Patient understands that his long-term prognosis from heart failure is poor.  In spite of knowing expected survival 6 to 12 months, he does not want to consider advanced heart failure treatment options at this time.  Continue DO NOT RESUSCITATE wishes, as per the patient.  We will involve palliative care on Monday.  CRITICAL CARE Performed by: Vernell Leep   Total critical care time: 35 minutes   Critical care time was exclusive of separately billable procedures and treating other patients.   Critical care was necessary to treat or prevent imminent or life-threatening deterioration.   Critical care was time  spent personally by me on the following activities: development  of treatment plan with patient and/or surrogate as well as nursing, discussions with consultants, evaluation of patient's response to treatment, examination of patient, obtaining history from patient or surrogate, ordering and performing treatments and interventions, ordering and review of laboratory studies, ordering and review of radiographic studies, pulse oximetry and re-evaluation of patient's condition.     Nigel Mormon, MD Pager: [phone] Office: [phone]

## 2020-12-28 NOTE — ED Notes (Signed)
Pt placed on 4L [place] with saturations staying at 95% and above. Respirations even and unlabored.

## 2020-12-28 NOTE — ED Triage Notes (Signed)
Pt bib ems with acute onset Resp distress. Pt with hx of CHF and MI. Pt arrives on CPAP with oxygen saturations of 85%. Recent orthopnea. Pt given 3 SL nitro en route without relief. 170/110, HR 70's. Pt endorses increased swelling to bil lower extremities and abdomen.

## 2020-12-29 ENCOUNTER — Inpatient Hospital Stay (HOSPITAL_COMMUNITY): Payer: Medicare Other

## 2020-12-29 LAB — GLUCOSE, CAPILLARY
Glucose-Capillary: 92 mg/dL (ref 70–99)
Glucose-Capillary: 177 mg/dL — ABNORMAL HIGH (ref 70–99)
Glucose-Capillary: 109 mg/dL — ABNORMAL HIGH (ref 70–99)
Glucose-Capillary: 116 mg/dL — ABNORMAL HIGH (ref 70–99)

## 2020-12-29 LAB — BASIC METABOLIC PANEL
Anion gap: 14 (ref 5–15)
BUN: 40 mg/dL — ABNORMAL HIGH (ref 8–23)
CO2: 25 mmol/L (ref 22–32)
Calcium: 8.9 mg/dL (ref 8.9–10.3)
Chloride: 105 mmol/L (ref 98–111)
Creatinine, Ser: 1.74 mg/dL — ABNORMAL HIGH (ref 0.61–1.24)
GFR, Estimated: 43 mL/min — ABNORMAL LOW (ref >60)
Glucose, Bld: 96 mg/dL (ref 70–99)
Potassium: 3.1 mmol/L — ABNORMAL LOW (ref 3.5–5.1)
Sodium: 144 mmol/L (ref 135–145)

## 2020-12-29 LAB — ECHOCARDIOGRAM COMPLETE
AR max vel: 2.61 cm2
AV Area VTI: 3.22 cm2
AV Area mean vel: 3.03 cm2
AV Mean grad: 3 mmHg
AV Peak grad: 5.1 mmHg
Ao pk vel: 1.13 m/s
Area-P 1/2: 3.42 cm2
Height: 70 in
MV M vel: 3.63 m/s
MV Peak grad: 52.7 mmHg
S' Lateral: 5 cm
Weight: 4758.41 oz

## 2020-12-29 LAB — HIV ANTIBODY (ROUTINE TESTING W REFLEX): HIV Screen 4th Generation wRfx: NONREACTIVE

## 2020-12-29 MED ORDER — SPIRONOLACTONE 25 MG PO TABS
25.0000 mg | ORAL_TABLET | Freq: Every day | ORAL | Status: DC
  Administered 2020-12-29 – 2021-01-02 (×5): 25 mg via ORAL
  Filled 2020-12-29 (×5): qty 1

## 2020-12-29 MED ORDER — POTASSIUM CHLORIDE CRYS ER 20 MEQ PO TBCR
40.0000 meq | EXTENDED_RELEASE_TABLET | Freq: Two times a day (BID) | ORAL | Status: DC
  Administered 2020-12-29 – 2020-12-31 (×5): 40 meq via ORAL
  Filled 2020-12-29 (×5): qty 2

## 2020-12-29 MED ORDER — PERFLUTREN LIPID MICROSPHERE
1.0000 mL | INTRAVENOUS | Status: AC | PRN
  Administered 2020-12-29: 5 mL via INTRAVENOUS
  Filled 2020-12-29: qty 10

## 2020-12-29 NOTE — Progress Notes (Signed)
  Echocardiogram 2D Echocardiogram has been performed with [facility].  Eduardo [name] 12/29/2020, 3:34 PM

## 2020-12-29 NOTE — Progress Notes (Addendum)
Subjective:  Breathing better. No oxygen at rest, but does get short of breath on ambulating inside the room  Objective:  Vital Signs in the last 24 hours: Temp:  [97.5 F (36.4 C)-98.9 F (37.2 C)] 97.5 F (36.4 C) (02/20 0817) Pulse Rate:  [54-93] 63 (02/20 0817) Resp:  [15-28] 16 (02/20 0817) BP: (86-145)/(45-82) 117/69 (02/20 0817) SpO2:  [94 %-100 %] 94 % (02/20 0501) FiO2 (%):  [100 %] 100 % (02/19 1114) Weight:  [134.9 kg-137.8 kg] 134.9 kg (02/20 0501)  Intake/Output from previous day: 02/19 0701 - 02/20 0700 In: 480 [P.O.:480] Out: 5110 [Urine:5110]  Physical Exam Vitals and nursing note reviewed.  Constitutional:      General: He is not in acute distress.    Appearance: He is well-developed.  HENT:     Head: Normocephalic and atraumatic.  Eyes:     Conjunctiva/sclera: Conjunctivae normal.     Pupils: Pupils are equal, round, and reactive to light.  Neck:     Vascular: No JVD.  Cardiovascular:     Rate and Rhythm: Normal rate and regular rhythm.     Pulses: Intact distal pulses.          Femoral pulses are 2+ on the right side and 1+ on the left side.      Dorsalis pedis pulses are 0 on the right side and 0 on the left side.       Posterior tibial pulses are 0 on the right side and 0 on the left side.     Heart sounds: No murmur heard.     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 Healed callous on medial aspect os Rt sole Pulmonary:     Effort: Pulmonary effort is normal.     Breath sounds: Rales (RLL) present. No wheezing.  Abdominal:     General: Bowel sounds are normal.     Palpations: Abdomen is soft.     Tenderness: There is no rebound.  Musculoskeletal:        General: No tenderness. Normal range of motion.     Right lower leg: Edema (1+) present.     Left lower leg: Edema (1+) present.  Lymphadenopathy:     Cervical: No cervical adenopathy.  Skin:    General: Skin is warm and dry.  Neurological:     Mental Status: He is alert and oriented to  person, place, and time.     Cranial Nerves: No cranial nerve deficit.      Lab Results: BMP Recent Labs    11/28/20 0930 12/05/20 1016 12/19/20 1031 12/28/20 1108 12/29/20 0323  NA 147* 144 144 142 144  K 4.9 4.8 4.8 4.2 3.1*  CL 106 103 105 108 105  CO2 24 23 22 23 25   GLUCOSE 84 113* 159* 195* 96  BUN 35* 38* 47* 42* 40*  CREATININE 1.55* 1.63* 1.82* 1.71* 1.74*  CALCIUM 9.3 8.8 9.3 9.2 8.9  GFRNONAA 46* 44* 38* 44* 43*  GFRAA 54* 50* 44*  --   --     CBC Recent Labs  Lab 12/28/20 1108  WBC 9.9  RBC 5.72  HGB 17.2*  HCT 52.9*  PLT 186  MCV 92.5  MCH 30.1  MCHC 32.5  RDW 13.9    HEMOGLOBIN A1C Lab Results  Component Value Date   HGBA1C 6.7 11/19/2020   MPG 182.9 10/11/2020    Cardiac Panel (last 3 results) Top HS 23-->46\  BNP (last 3 results) Recent Labs    10/22/20  1605 11/12/20 1350 12/28/20 1108  BNP 460.7* 630.2* 1,146.7*    TSH Recent Labs    06/24/20 0937  TSH 1.650    Lipid Panel     Component Value Date/Time   CHOL 137 06/24/2020 0937   TRIG 144 10/12/2020 0511   HDL 33 (L) 06/24/2020 0937   CHOLHDL 4.2 06/24/2020 0937   CHOLHDL 4.4 09/28/2011 1358   VLDL 21 09/28/2011 1358   LDLCALC 72 06/24/2020 0937     Hepatic Function Panel Recent Labs    10/11/20 1143 10/22/20 1605 12/28/20 1108  PROT 6.2* 6.6 7.0  ALBUMIN 3.4* 3.9 3.9  AST 124* 31 50*  ALT 35 40 36  ALKPHOS 65 72 63  BILITOT 0.8 0.6 0.8    Cardiac Studies:  Telemetry 12/29/2020: 13 beat Vtach  EKG 12/28/2020: Sinus rhythm Left bundle branch block  Echocardiogram 10/12/2020: 1. Not well visualized, LVH degree unable to be assessed due to poor echo  window. . Left ventricular ejection fraction, by estimation, is 20 to 25%.  The left ventricle has severely decreased function. The left ventricle  demonstrates regional wall motion  abnormalities (see scoring diagram/findings for description). Left  ventricular diastolic parameters are consistent  with Grade I diastolic  dysfunction (impaired relaxation).  2. Right ventricular systolic function is normal. The right ventricular  size is normal.  3. The mitral valve is normal in structure. Trivial mitral valve  regurgitation. No evidence of mitral stenosis.  4. The aortic valve is normal in structure. Aortic valve regurgitation is  not visualized. No aortic stenosis is present.  5. The inferior vena cava is dilated in size with <50% respiratory  variability, suggesting right atrial pressure of 15 mmHg.   Left Heart Catheterization 10/11/20: LV: Severely dilated. Global hypokinesis. Hand contrast injection hence not fully adequately visualized. LVEF 15 to 20%. EDP markedly elevated at 29 mmHg. No pressure gradient across the aortic valve. Left main: Normal. LAD: Severely diffusely diseased. Gives origin to large D1 which gives collaterals to the LAD and 2 smaller diagonals, LAD is occluded after the origin of D1, anatomy compared to prior in 2017 reveals progression of diffuse disease. There are ipsilateral and contralateral collaterals to the LAD. CX: Moderate sized vessel, giving origin to large OM1. OM1 is occluded in the ostium as was noted previously and has ipsilateral collaterals. OM1 is diffusely diseased and faintly filled.  RCA: Moderate disease in the proximal and mid segment. At the bifurcation of PDA and PL, there is a high-grade 90% stenosis. The bifurcation is also involved with at least a 90% stenosis in the PDA and a 60 to 70% stenosis in the PL branch. There is no target as distally as the PL branch which is large is occluded distally that was also noted in 2017. There are faint collaterals noted from the left to the RCA.  Impression: Severe native vessel three-vessel coronary artery disease with no significant targets for revascularization, LAD may be amenable for revascularization but I am not sure that this would help. Patient is extremely ill with high  risk for mortality. He also has underlying stage III-IV kidney disease and contrast nephropathy needs to be evaluated further in view of contrast load of 70 mL. I try to reach his friend on the contact list, unable to reach.   Assessment/Plan  65 y.o. Caucasian male  with hypertension, type 2 diabetes mellitus, multivessel coronary artery disease, ischemic cardiomyopathy with systolic heart failure EF 20-25%, PAD, admitted with acute on chronic systolic  heart failure.  Acute on chronic systolic heart failure: Ischemic cardiomyopathy, NYHA class IV.   Echocardiogram pending Improving, still dyspneic on ambulation, has 1_ b/l pitting edema Diuresing well, 5 L net negative for hospital stay Added spironolactone 12.5 mg daily Switch IX lasix 60 mg bid to 40 mg daily, with addition of PO torsemide 20 mg bid (Inadequate response to PO lasix at home as evident by this hospitalization) This will help transition form IV to PO diuretics, in preparation to discharge Continue Entresto 24/26 mg twice daily. Hold carvedilol for now. Resume at lower dose 3.125 mg bid on 2/21.  Troponin elevation: High sensitive troponin is elevated at 23-->46.  Likely type 2 MI in the setting of known CAD and acute systolic heart failure decompensation. Continue aspirin, Plavix, rosuvastatin. Reviewed and compared coronary angiography in 10/2020 and 2017. LAD has been long occluded, with recent change being critical distal RCA/PDA bifurcation stenosis. This limits perfusion in RCA territory, as well as offers limited to no collateralization to LAD territory, that was seen in 2017. While he does not have any adequate targets for surgical revascularization, especially in LAD. I will discuss with patient's primary cardiologist Dr Einar Gip if high risk PCI to RCA could offer improved collateralization to LAD territory, in addition to improving ischemia in RCA territory. Goal would be to improve his perfusion, hopes to help  heart failure and reduce recurrent hospitalziations  Type 2 DM: Continue insulin, Jardiance  Goals of care: Patient understands that his long-term prognosis from heart failure is poor.  In spite of knowing expected survival 6 to 12 months, he does not want to consider advanced heart failure treatment options at this time.  Continue DO NOT RESUSCITATE wishes, as per the patient.  While we discuss management options for CAD and heart failure, it may be reasonable to involve palliative care.  He will need additional 1-2 days of hospital stay   Nigel Mormon, MD Pager: [phone] Office: [phone]

## 2020-12-29 NOTE — Progress Notes (Signed)
Pt had 13 beat run of vtach. Pt was asleep and asymptomatic. MD notified. No new orders.

## 2020-12-30 ENCOUNTER — Telehealth (HOSPITAL_COMMUNITY): Payer: Self-pay

## 2020-12-30 LAB — BASIC METABOLIC PANEL
Anion gap: 10 (ref 5–15)
BUN: 36 mg/dL — ABNORMAL HIGH (ref 8–23)
CO2: 30 mmol/L (ref 22–32)
Calcium: 9.1 mg/dL (ref 8.9–10.3)
Chloride: 105 mmol/L (ref 98–111)
Creatinine, Ser: 1.84 mg/dL — ABNORMAL HIGH (ref 0.61–1.24)
GFR, Estimated: 40 mL/min — ABNORMAL LOW (ref >60)
Glucose, Bld: 45 mg/dL — ABNORMAL LOW (ref 70–99)
Potassium: 3.4 mmol/L — ABNORMAL LOW (ref 3.5–5.1)
Sodium: 145 mmol/L (ref 135–145)

## 2020-12-30 LAB — GLUCOSE, CAPILLARY
Glucose-Capillary: 72 mg/dL (ref 70–99)
Glucose-Capillary: 73 mg/dL (ref 70–99)
Glucose-Capillary: 174 mg/dL — ABNORMAL HIGH (ref 70–99)
Glucose-Capillary: 106 mg/dL — ABNORMAL HIGH (ref 70–99)
Glucose-Capillary: 121 mg/dL — ABNORMAL HIGH (ref 70–99)

## 2020-12-30 MED ORDER — LIP MEDEX EX OINT
TOPICAL_OINTMENT | CUTANEOUS | Status: DC | PRN
  Filled 2020-12-30: qty 7

## 2020-12-30 MED ORDER — INSULIN ASPART 100 UNIT/ML ~~LOC~~ SOLN
0.0000 [IU] | Freq: Three times a day (TID) | SUBCUTANEOUS | Status: DC
  Administered 2020-12-30: 2 [IU] via SUBCUTANEOUS

## 2020-12-30 MED ORDER — ENOXAPARIN SODIUM 60 MG/0.6ML ~~LOC~~ SOLN
60.0000 mg | SUBCUTANEOUS | Status: DC
  Administered 2020-12-30 – 2021-01-01 (×2): 60 mg via SUBCUTANEOUS
  Filled 2020-12-30 (×3): qty 0.6

## 2020-12-30 MED ORDER — INSULIN ASPART 100 UNIT/ML ~~LOC~~ SOLN
3.0000 [IU] | Freq: Three times a day (TID) | SUBCUTANEOUS | Status: DC
  Administered 2020-12-30 – 2021-01-02 (×6): 3 [IU] via SUBCUTANEOUS

## 2020-12-30 MED ORDER — INSULIN NPH (HUMAN) (ISOPHANE) 100 UNIT/ML ~~LOC~~ SUSP
25.0000 [IU] | Freq: Two times a day (BID) | SUBCUTANEOUS | Status: DC
  Administered 2020-12-30: 25 [IU] via SUBCUTANEOUS
  Filled 2020-12-30: qty 10

## 2020-12-30 NOTE — Progress Notes (Signed)
Subjective:  States that his dyspnea is improved.  He has not had any chest pain or palpitations.  Intake/Output from previous day:  I/O last 3 completed shifts: In: 0347 [P.O.:1480; I.V.:3] Out: 6425 [Urine:6425] No intake/output data recorded.  Blood pressure 118/65, pulse 62, temperature (!) 97.5 F (36.4 C), temperature source Oral, resp. rate 20, height _0  (1.778 m), weight 133.7 kg, SpO2 97 %. Body mass index is 42.3 kg/m.  Vitals with BMI 12/30/2020 12/30/2020 12/30/2020  Height - - -  Weight - - -  BMI - - -  Systolic 425 956 387  Diastolic 67 66 65  Pulse 63 68 71    Physical Exam Constitutional:      Appearance: He is well-developed.     Comments: Morbidly obese, in no acute distress  HENT:     Head: Atraumatic.  Neck:     Thyroid: No thyromegaly.     Comments: Short neck and difficult to evaluate JVP Cardiovascular:     Pulses:          Carotid pulses are 2+ on the right side and 2+ on the left side.      Popliteal pulses are 0 on the right side and 2+ on the left side.       Dorsalis pedis pulses are 1+ on the right side and 2+ on the left side.       Posterior tibial pulses are 0 on the right side and 1+ on the left side.     Heart sounds: No murmur heard. No gallop.      Comments: Femoral and popliteal pulse difficult to feel due to patient's body habitus. Pulmonary:     Effort: Pulmonary effort is normal.     Breath sounds: Rales (Scattered especially at the bases) present.  Abdominal:     Palpations: Abdomen is soft.     Comments: Obese. Pannus present  Musculoskeletal:        General: Normal range of motion.     Cervical back: Neck supple.  Skin:    General: Skin is warm and dry.     Capillary Refill: Capillary refill takes less than 2 seconds.  Neurological:     General: No focal deficit present.     Mental Status: He is alert and oriented to person, place, and time.     Lab Results: BMP BNP (last 3 results) Recent Labs    10/22/20 1605  11/12/20 1350 12/28/20 1108  BNP 460.7* 630.2* 1,146.7*    BMP Latest Ref Rng & Units 12/30/2020 12/29/2020 12/28/2020  Glucose 70 - 99 mg/dL 45(L) 96 195(H)  BUN 8 - 23 mg/dL 36(H) 40(H) 42(H)  Creatinine 0.61 - 1.24 mg/dL 1.84(H) 1.74(H) 1.71(H)  BUN/Creat Ratio 10 - 24 - - -  Sodium 135 - 145 mmol/L 145 144 142  Potassium 3.5 - 5.1 mmol/L 3.4(L) 3.1(L) 4.2  Chloride 98 - 111 mmol/L 105 105 108  CO2 22 - 32 mmol/L _1 Calcium 8.9 - 10.3 mg/dL 9.1 8.9 9.2   Hepatic Function Latest Ref Rng & Units 12/28/2020 10/22/2020 10/11/2020  Total Protein 6.5 - 8.1 g/dL 7.0 6.6 6.2(L)  Albumin 3.5 - 5.0 g/dL 3.9 3.9 3.4(L)  AST 15 - 41 U/L 50(H) 31 124(H)  ALT 0 - 44 U/L 36 40 35  Alk Phosphatase 38 - 126 U/L 63 72 65  Total Bilirubin 0.3 - 1.2 mg/dL 0.8 0.6 0.8   CBC Latest Ref Rng & Units 12/28/2020 10/22/2020  10/14/2020  WBC 4.0 - 10.5 K/uL 9.9 7.1 9.0  Hemoglobin 13.0 - 17.0 g/dL 17.2(H) 15.6 14.8  Hematocrit 39.0 - 52.0 % 52.9(H) 46.2 42.7  Platelets 150 - 400 K/uL 186 259 132(L)   Lipid Panel     Component Value Date/Time   CHOL 137 06/24/2020 0937   TRIG 144 10/12/2020 0511   HDL 33 (L) 06/24/2020 0937   CHOLHDL 4.2 06/24/2020 0937   CHOLHDL 4.4 09/28/2011 1358   VLDL 21 09/28/2011 1358   LDLCALC 72 06/24/2020 0937   Cardiac Panel (last 3 results) No results for input(s): CKTOTAL, CKMB, TROPONINI, RELINDX in the last 72 hours.  HEMOGLOBIN A1C Lab Results  Component Value Date   HGBA1C 6.7 11/19/2020   MPG 182.9 10/11/2020   TSH Recent Labs    06/24/20 0937  TSH 1.650    Component Ref Range & Units 2 d ago  (12/28/20) 2 d ago  (12/28/20) 2 mo ago  (10/11/20) 2 mo ago  (10/11/20) 2 mo ago  (10/11/20)  Troponin I (High Sensitivity) <18 ng/L 46High  23High CM          Cardiac Studies:  EKG:    EKG 12/28/2020: Normal sinus rhythm with rate of 93 bpm, left atrial enlargement, left bundle branch block.  PVCs (1).    Left Heart Catheterization  10/11/20: LV: Severely dilated. Global hypokinesis. Hand contrast injection hence not fully adequately visualized. LVEF 15 to 20%. EDP markedly elevated at 29 mmHg. No pressure gradient across the aortic valve. Left main: Normal. LAD: Severely diffusely diseased. Gives origin to large D1 which gives collaterals to the LAD and 2 smaller diagonals, LAD is occluded after the origin of D1, anatomy compared to prior in 2017 reveals progression of diffuse disease. There are ipsilateral and contralateral collaterals to the LAD. CX: Moderate sized vessel, giving origin to large OM1. OM1 is occluded in the ostium as was noted previously and has ipsilateral collaterals. OM1 is diffusely diseased and faintly filled.  RCA: Moderate disease in the proximal and mid segment. At the bifurcation of PDA and PL, there is a high-grade 90% stenosis. The bifurcation is also involved with at least a 90% stenosis in the PDA and a 60 to 70% stenosis in the PL branch. There is no target as distally as the PL branch which is large is occluded distally that was also noted in 2017. There are faint collaterals noted from the left to the RCA.  Impression: Severe native vessel three-vessel coronary artery disease with no significant targets for revascularization, LAD may be amenable for revascularization but I am not sure that this would help. Patient is extremely ill with high risk for mortality. He also has underlying stage III-IV kidney disease and contrast nephropathy needs to be evaluated further in view of contrast load of 70 mL. I try to reach his friend on the contact list, unable to reach.  Echocardiogram 12/29/2020:   1. Left ventricular ejection fraction, by estimation, is <20%. The left ventricle has severely decreased function. The left ventricle demonstrates regional wall motion abnormalities (see scoring diagram/findings for description). The left ventricular internal cavity size was moderately  dilated. There is mild left ventricular hypertrophy. Left ventricular diastolic parameters are consistent with Grade II diastolic dysfunction (pseudonormalization). Elevated left atrial pressure. Akinetic inferolateral and apical myocardium. Moderate anteroseptal hypokinesis. 2. Right ventricular systolic function is low normal. The right ventricular size is normal. 3. The mitral valve is grossly normal. Mild to moderate mitral valve regurgitation. 4. The  aortic valve is tricuspid. Aortic valve regurgitation is not visualized. 5. The inferior vena cava is dilated in size with <50% respiratory variability, suggesting right atrial pressure of 15 mmHg. 6. Compared to previous study in 10/12/2020, LVEF has further decreased from 20-25% to 15-20%.  No current facility-administered medications on file prior to encounter.   Current Outpatient Medications on File Prior to Encounter  Medication Sig Dispense Refill  . acetaminophen (TYLENOL) 325 MG tablet Take 650 mg by mouth every 6 (six) hours as needed for mild pain (or headaches).    Marland Kitchen aspirin 81 MG chewable tablet Chew 1 tablet (81 mg total) by mouth daily.    Marland Kitchen Bioflavonoid Products (ESTER C PO) Take 1,000 mg by mouth daily.    . carvedilol (COREG) 6.25 MG tablet Take 6.25 mg by mouth 2 (two) times daily with a meal.    . Cholecalciferol (VITAMIN D) 2000 units CAPS Take 2,000 Units by mouth daily.    . clopidogrel (PLAVIX) 75 MG tablet Take 1 tablet (75 mg total) by mouth daily. 90 tablet 1  . Continuous Blood Gluc Sensor (FREESTYLE LIBRE 14 DAY SENSOR) MISC every 14 (fourteen) days.    . empagliflozin (JARDIANCE) 10 MG TABS tablet Take 1 tablet (10 mg total) by mouth daily. 90 tablet 3  . furosemide (LASIX) 40 MG tablet Take 1 tablet (40 mg total) by mouth daily. (Patient taking differently: Take 40-80 mg by mouth See admin instructions. Take 80 mg by mouth in the morning when retaining fluid and decrease to 40 mg when resolved) 30 tablet 1  .  gemfibrozil (LOPID) 600 MG tablet Take 600 mg by mouth 2 (two) times daily before a meal.    . HUMALOG KWIKPEN 100 UNIT/ML KiwkPen Inject 0-10 Units into the skin See admin instructions. Inject 0-10 units into the skin three times a day, per sliding scale- based on BGL >100  1  . HUMULIN N KWIKPEN 100 UNIT/ML Kiwkpen Inject 30 Units into the skin See admin instructions. Inject 30 units into the skin before breakfast and supper  1  . isosorbide mononitrate (IMDUR) 60 MG 24 hr tablet Take 1 tablet (60 mg total) by mouth daily. 90 tablet 1  . Multiple Vitamin (MULTIVITAMIN WITH MINERALS) TABS tablet Take 1 tablet by mouth daily.    . nitroGLYCERIN (NITROSTAT) 0.4 MG SL tablet Place 0.4 mg under the tongue every 5 (five) minutes as needed for chest pain.  1  . Omega-3 Fatty Acids (FISH OIL PO) Take 350 mg by mouth daily with breakfast.    . Probiotic Product (PROBIOTIC-10 PO) Take 1 capsule by mouth daily.    . rosuvastatin (CRESTOR) 20 MG tablet Take 1 tablet (20 mg total) by mouth daily. 90 tablet 3  . sacubitril-valsartan (ENTRESTO) 49-51 MG Take 0.5 tablets by mouth 2 (two) times daily.    . sodium zirconium cyclosilicate (LOKELMA) 10 g PACK packet Take 10 g by mouth daily. 33 each 1  . B-D UF III MINI PEN NEEDLES 31G X 5 MM MISC 6 (six) times daily.  0  . carvedilol (COREG) 12.5 MG tablet Take 1 tablet (12.5 mg total) by mouth 2 (two) times daily with a meal. (Patient not taking: No sig reported) 180 tablet 3  . doxycycline (VIBRA-TABS) 100 MG tablet Take 1 tablet (100 mg total) by mouth 2 (two) times daily. (Patient not taking: No sig reported) 20 tablet 0    Scheduled Meds: . aspirin  81 mg Oral Daily  . clopidogrel  75 mg Oral Daily  . empagliflozin  10 mg Oral Daily  . gemfibrozil  600 mg Oral BID AC  . heparin  5,000 Units Subcutaneous Q8H  . insulin aspart  0-15 Units Subcutaneous TID WC  . insulin aspart  4 Units Subcutaneous TID WC  . insulin NPH Human  30 Units Subcutaneous BID AC &  HS  . isosorbide mononitrate  60 mg Oral Daily  . potassium chloride  40 mEq Oral BID  . rosuvastatin  20 mg Oral Daily  . sacubitril-valsartan  1 tablet Oral BID  . sodium chloride flush  3 mL Intravenous Q12H  . spironolactone  25 mg Oral Daily   Continuous Infusions: . sodium chloride     PRN Meds:.sodium chloride, acetaminophen, ondansetron (ZOFRAN) IV, sodium chloride flush  Assessment/Plan:  1.  Acute on chronic systolic and diastolic heart failure which is unstable angina equivalent 2.  Unstable angina pectoris 3.  Severe multivessel coronary artery disease, not a candidate for CABG as there is no targets. 4.  Severe ischemic cardiomyopathy, patient had refused LVAD evaluation.  Recommendation: I have reviewed the coronary angiography from 10/11/2020.  The only option is to try to revascularize his LAD in the hopes of improving his LVEF and longevity.  High risk intervention discussed with the patient, he will need Impella circulatory assist.  He also has peripheral arterial disease and this needs to be kept in mind during access.  He does not appear to have any other options except conservative therapy and consider palliative care.  Patient is only 65 years of age.  I discussed the high risk intervention with the patient, is willing to proceed and understands 2 to 3% risk of death, stroke, acute ischemic leg, renal failure, bleeding but not limited to this.    Adrian Prows, MD, [facility] 12/30/2020, 7:23 PM Office: [phone] Pager: [phone]

## 2020-12-30 NOTE — Progress Notes (Signed)
  Mobility Specialist Criteria Algorithm Info.   Mobility Team: HOB elevated: Activity: Ambulated in hall (in chair before and after) Range of motion: Active; All extremities Level of assistance: Independent Assistive device: None Minutes sitting in chair:  Minutes stood: 5 minutes Minutes ambulated: 5 minutes Distance ambulated (ft): 500 ft Mobility response: Tolerated well Bed Position: Chair  Patient is independent. Tolerated ambulation well without complaint or incident.  12/30/2020 2:56 PM

## 2020-12-30 NOTE — Telephone Encounter (Signed)
No response from pt.  Closed referral  

## 2020-12-30 NOTE — H&P (View-Only) (Signed)
Subjective:  States that his dyspnea is improved.  He has not had any chest pain or palpitations.  Intake/Output from previous day:  I/O last 3 completed shifts: In: 0347 [P.O.:1480; I.V.:3] Out: 6425 [Urine:6425] No intake/output data recorded.  Blood pressure 118/65, pulse 62, temperature (!) 97.5 F (36.4 C), temperature source Oral, resp. rate 20, height _0  (1.778 m), weight 133.7 kg, SpO2 97 %. Body mass index is 42.3 kg/m.  Vitals with BMI 12/30/2020 12/30/2020 12/30/2020  Height - - -  Weight - - -  BMI - - -  Systolic 425 956 387  Diastolic 67 66 65  Pulse 63 68 71    Physical Exam Constitutional:      Appearance: He is well-developed.     Comments: Morbidly obese, in no acute distress  HENT:     Head: Atraumatic.  Neck:     Thyroid: No thyromegaly.     Comments: Short neck and difficult to evaluate JVP Cardiovascular:     Pulses:          Carotid pulses are 2+ on the right side and 2+ on the left side.      Popliteal pulses are 0 on the right side and 2+ on the left side.       Dorsalis pedis pulses are 1+ on the right side and 2+ on the left side.       Posterior tibial pulses are 0 on the right side and 1+ on the left side.     Heart sounds: No murmur heard. No gallop.      Comments: Femoral and popliteal pulse difficult to feel due to patient's body habitus. Pulmonary:     Effort: Pulmonary effort is normal.     Breath sounds: Rales (Scattered especially at the bases) present.  Abdominal:     Palpations: Abdomen is soft.     Comments: Obese. Pannus present  Musculoskeletal:        General: Normal range of motion.     Cervical back: Neck supple.  Skin:    General: Skin is warm and dry.     Capillary Refill: Capillary refill takes less than 2 seconds.  Neurological:     General: No focal deficit present.     Mental Status: He is alert and oriented to person, place, and time.     Lab Results: BMP BNP (last 3 results) Recent Labs    10/22/20 1605  11/12/20 1350 12/28/20 1108  BNP 460.7* 630.2* 1,146.7*    BMP Latest Ref Rng & Units 12/30/2020 12/29/2020 12/28/2020  Glucose 70 - 99 mg/dL 45(L) 96 195(H)  BUN 8 - 23 mg/dL 36(H) 40(H) 42(H)  Creatinine 0.61 - 1.24 mg/dL 1.84(H) 1.74(H) 1.71(H)  BUN/Creat Ratio 10 - 24 - - -  Sodium 135 - 145 mmol/L 145 144 142  Potassium 3.5 - 5.1 mmol/L 3.4(L) 3.1(L) 4.2  Chloride 98 - 111 mmol/L 105 105 108  CO2 22 - 32 mmol/L _1 Calcium 8.9 - 10.3 mg/dL 9.1 8.9 9.2   Hepatic Function Latest Ref Rng & Units 12/28/2020 10/22/2020 10/11/2020  Total Protein 6.5 - 8.1 g/dL 7.0 6.6 6.2(L)  Albumin 3.5 - 5.0 g/dL 3.9 3.9 3.4(L)  AST 15 - 41 U/L 50(H) 31 124(H)  ALT 0 - 44 U/L 36 40 35  Alk Phosphatase 38 - 126 U/L 63 72 65  Total Bilirubin 0.3 - 1.2 mg/dL 0.8 0.6 0.8   CBC Latest Ref Rng & Units 12/28/2020 10/22/2020  10/14/2020  WBC 4.0 - 10.5 K/uL 9.9 7.1 9.0  Hemoglobin 13.0 - 17.0 g/dL 17.2(H) 15.6 14.8  Hematocrit 39.0 - 52.0 % 52.9(H) 46.2 42.7  Platelets 150 - 400 K/uL 186 259 132(L)   Lipid Panel     Component Value Date/Time   CHOL 137 06/24/2020 0937   TRIG 144 10/12/2020 0511   HDL 33 (L) 06/24/2020 0937   CHOLHDL 4.2 06/24/2020 0937   CHOLHDL 4.4 09/28/2011 1358   VLDL 21 09/28/2011 1358   LDLCALC 72 06/24/2020 0937   Cardiac Panel (last 3 results) No results for input(s): CKTOTAL, CKMB, TROPONINI, RELINDX in the last 72 hours.  HEMOGLOBIN A1C Lab Results  Component Value Date   HGBA1C 6.7 11/19/2020   MPG 182.9 10/11/2020   TSH Recent Labs    06/24/20 0937  TSH 1.650    Component Ref Range & Units 2 d ago  (12/28/20) 2 d ago  (12/28/20) 2 mo ago  (10/11/20) 2 mo ago  (10/11/20) 2 mo ago  (10/11/20)  Troponin I (High Sensitivity) <18 ng/L 46High  23High CM          Cardiac Studies:  EKG:    EKG 12/28/2020: Normal sinus rhythm with rate of 93 bpm, left atrial enlargement, left bundle branch block.  PVCs (1).    Left Heart Catheterization  10/11/20: LV: Severely dilated. Global hypokinesis. Hand contrast injection hence not fully adequately visualized. LVEF 15 to 20%. EDP markedly elevated at 29 mmHg. No pressure gradient across the aortic valve. Left main: Normal. LAD: Severely diffusely diseased. Gives origin to large D1 which gives collaterals to the LAD and 2 smaller diagonals, LAD is occluded after the origin of D1, anatomy compared to prior in 2017 reveals progression of diffuse disease. There are ipsilateral and contralateral collaterals to the LAD. CX: Moderate sized vessel, giving origin to large OM1. OM1 is occluded in the ostium as was noted previously and has ipsilateral collaterals. OM1 is diffusely diseased and faintly filled.  RCA: Moderate disease in the proximal and mid segment. At the bifurcation of PDA and PL, there is a high-grade 90% stenosis. The bifurcation is also involved with at least a 90% stenosis in the PDA and a 60 to 70% stenosis in the PL branch. There is no target as distally as the PL branch which is large is occluded distally that was also noted in 2017. There are faint collaterals noted from the left to the RCA.  Impression: Severe native vessel three-vessel coronary artery disease with no significant targets for revascularization, LAD may be amenable for revascularization but I am not sure that this would help. Patient is extremely ill with high risk for mortality. He also has underlying stage III-IV kidney disease and contrast nephropathy needs to be evaluated further in view of contrast load of 70 mL. I try to reach his friend on the contact list, unable to reach.  Echocardiogram 12/29/2020:   1. Left ventricular ejection fraction, by estimation, is <20%. The left ventricle has severely decreased function. The left ventricle demonstrates regional wall motion abnormalities (see scoring diagram/findings for description). The left ventricular internal cavity size was moderately  dilated. There is mild left ventricular hypertrophy. Left ventricular diastolic parameters are consistent with Grade II diastolic dysfunction (pseudonormalization). Elevated left atrial pressure. Akinetic inferolateral and apical myocardium. Moderate anteroseptal hypokinesis. 2. Right ventricular systolic function is low normal. The right ventricular size is normal. 3. The mitral valve is grossly normal. Mild to moderate mitral valve regurgitation. 4. The  aortic valve is tricuspid. Aortic valve regurgitation is not visualized. 5. The inferior vena cava is dilated in size with <50% respiratory variability, suggesting right atrial pressure of 15 mmHg. 6. Compared to previous study in 10/12/2020, LVEF has further decreased from 20-25% to 15-20%.  No current facility-administered medications on file prior to encounter.   Current Outpatient Medications on File Prior to Encounter  Medication Sig Dispense Refill  . acetaminophen (TYLENOL) 325 MG tablet Take 650 mg by mouth every 6 (six) hours as needed for mild pain (or headaches).    Marland Kitchen aspirin 81 MG chewable tablet Chew 1 tablet (81 mg total) by mouth daily.    Marland Kitchen Bioflavonoid Products (ESTER C PO) Take 1,000 mg by mouth daily.    . carvedilol (COREG) 6.25 MG tablet Take 6.25 mg by mouth 2 (two) times daily with a meal.    . Cholecalciferol (VITAMIN D) 2000 units CAPS Take 2,000 Units by mouth daily.    . clopidogrel (PLAVIX) 75 MG tablet Take 1 tablet (75 mg total) by mouth daily. 90 tablet 1  . Continuous Blood Gluc Sensor (FREESTYLE LIBRE 14 DAY SENSOR) MISC every 14 (fourteen) days.    . empagliflozin (JARDIANCE) 10 MG TABS tablet Take 1 tablet (10 mg total) by mouth daily. 90 tablet 3  . furosemide (LASIX) 40 MG tablet Take 1 tablet (40 mg total) by mouth daily. (Patient taking differently: Take 40-80 mg by mouth See admin instructions. Take 80 mg by mouth in the morning when retaining fluid and decrease to 40 mg when resolved) 30 tablet 1  .  gemfibrozil (LOPID) 600 MG tablet Take 600 mg by mouth 2 (two) times daily before a meal.    . HUMALOG KWIKPEN 100 UNIT/ML KiwkPen Inject 0-10 Units into the skin See admin instructions. Inject 0-10 units into the skin three times a day, per sliding scale- based on BGL >100  1  . HUMULIN N KWIKPEN 100 UNIT/ML Kiwkpen Inject 30 Units into the skin See admin instructions. Inject 30 units into the skin before breakfast and supper  1  . isosorbide mononitrate (IMDUR) 60 MG 24 hr tablet Take 1 tablet (60 mg total) by mouth daily. 90 tablet 1  . Multiple Vitamin (MULTIVITAMIN WITH MINERALS) TABS tablet Take 1 tablet by mouth daily.    . nitroGLYCERIN (NITROSTAT) 0.4 MG SL tablet Place 0.4 mg under the tongue every 5 (five) minutes as needed for chest pain.  1  . Omega-3 Fatty Acids (FISH OIL PO) Take 350 mg by mouth daily with breakfast.    . Probiotic Product (PROBIOTIC-10 PO) Take 1 capsule by mouth daily.    . rosuvastatin (CRESTOR) 20 MG tablet Take 1 tablet (20 mg total) by mouth daily. 90 tablet 3  . sacubitril-valsartan (ENTRESTO) 49-51 MG Take 0.5 tablets by mouth 2 (two) times daily.    . sodium zirconium cyclosilicate (LOKELMA) 10 g PACK packet Take 10 g by mouth daily. 33 each 1  . B-D UF III MINI PEN NEEDLES 31G X 5 MM MISC 6 (six) times daily.  0  . carvedilol (COREG) 12.5 MG tablet Take 1 tablet (12.5 mg total) by mouth 2 (two) times daily with a meal. (Patient not taking: No sig reported) 180 tablet 3  . doxycycline (VIBRA-TABS) 100 MG tablet Take 1 tablet (100 mg total) by mouth 2 (two) times daily. (Patient not taking: No sig reported) 20 tablet 0    Scheduled Meds: . aspirin  81 mg Oral Daily  . clopidogrel  75 mg Oral Daily  . empagliflozin  10 mg Oral Daily  . gemfibrozil  600 mg Oral BID AC  . heparin  5,000 Units Subcutaneous Q8H  . insulin aspart  0-15 Units Subcutaneous TID WC  . insulin aspart  4 Units Subcutaneous TID WC  . insulin NPH Human  30 Units Subcutaneous BID AC &  HS  . isosorbide mononitrate  60 mg Oral Daily  . potassium chloride  40 mEq Oral BID  . rosuvastatin  20 mg Oral Daily  . sacubitril-valsartan  1 tablet Oral BID  . sodium chloride flush  3 mL Intravenous Q12H  . spironolactone  25 mg Oral Daily   Continuous Infusions: . sodium chloride     PRN Meds:.sodium chloride, acetaminophen, ondansetron (ZOFRAN) IV, sodium chloride flush  Assessment/Plan:  1.  Acute on chronic systolic and diastolic heart failure which is unstable angina equivalent 2.  Unstable angina pectoris 3.  Severe multivessel coronary artery disease, not a candidate for CABG as there is no targets. 4.  Severe ischemic cardiomyopathy, patient had refused LVAD evaluation.  Recommendation: I have reviewed the coronary angiography from 10/11/2020.  The only option is to try to revascularize his LAD in the hopes of improving his LVEF and longevity.  High risk intervention discussed with the patient, he will need Impella circulatory assist.  He also has peripheral arterial disease and this needs to be kept in mind during access.  He does not appear to have any other options except conservative therapy and consider palliative care.  Patient is only 65 years of age.  I discussed the high risk intervention with the patient, is willing to proceed and understands 2 to 3% risk of death, stroke, acute ischemic leg, renal failure, bleeding but not limited to this.    Adrian Prows, MD, [facility] 12/30/2020, 7:23 PM Office: [phone] Pager: [phone]

## 2020-12-30 NOTE — Progress Notes (Signed)
Pt is currently on SQ heparin for DVT prophylaxis. His BMI is 42. Ok to change heparin to SQ lovenox 0.5mg /kg/day per Dr. Einar Gip.  Onnie Boer, PharmD, BCIDP, AAHIVP, CPP Infectious Disease Pharmacist 12/30/2020 11:22 AM

## 2020-12-30 NOTE — Progress Notes (Signed)
Inpatient Diabetes Program Recommendations  AACE/ADA: New Consensus Statement on Inpatient Glycemic Control (2015)  Target Ranges:  Prepandial:   less than 140 mg/dL      Peak postprandial:   less than 180 mg/dL (1-2 hours)      Critically ill patients:  140 - 180 mg/dL   Lab Results  Component Value Date   GLUCAP 73 12/30/2020   HGBA1C 6.7 11/19/2020    Review of Glycemic Control Results for Eduardo [name], Eduardo [name]" (MRN [id number]) as of 12/30/2020 11:19  Ref. Range 12/29/2020 05:56 12/29/2020 12:15 12/29/2020 16:10 12/29/2020 21:12 12/30/2020 05:59 12/30/2020 08:24  Glucose-Capillary Latest Ref Range: 70 - 99 mg/dL 92 177 (H) 109 (H) 116 (H) 72 73   Diabetes history: DM2 Outpatient Diabetes medications: Humulin N 30 units ac meals bid + Humalog 0-10 units tid ac meals + Jardiance 10 mg Current orders for Inpatient glycemic control: Novolog N 30 units ac breakfast & hs + Novolog 4 units tid meal coverage if eats 50% + Novolog 0-15 units tid + Jardiance 10 mg qd  Inpatient Diabetes Program Recommendations:    -Change Novolog N to 25 units bid ac breakfast & supper -Decrease Novolog correction to 0-9 units tid -Decrease Novolog meal coverage to 3 units tid if eats 50% Secure chat sent to Dr. Virgina Jock.  Thank you, Nani Gasser. Hanks, RN, MSN, CDE  Diabetes Coordinator Inpatient Glycemic Control Team Team Pager [phone] (8am-5pm) 12/30/2020 11:23 AM

## 2020-12-30 NOTE — TOC Progression Note (Signed)
Transition of Care [facility]) - Progression Note    Patient Details  Name: Eduardo [name] MRN: [id number] Date of Birth: 1955-12-07  [facility]) CM/SW Contact  Zenon Mayo, RN Phone Number: 12/30/2020, 3:27 PM  Clinical Narrative:    From home alone, he is indep, he states he has a follow up apt in August with his PCP. Which is a ways from now.  He has a scale at home.  He states he does not want any HH services or need any at this time.  He has no issues with transportation or getting medications.  TOC will continue to follow for dc needs.        Expected Discharge Plan and Services                                                 Social Determinants of Health (SDOH) Interventions    Readmission Risk Interventions No flowsheet data found.

## 2020-12-31 ENCOUNTER — Inpatient Hospital Stay (HOSPITAL_COMMUNITY)
Admission: EM | Disposition: A | Discharge status: Home / Self Care | Payer: Self-pay | Source: Home / Self Care | Attending: Cardiology

## 2020-12-31 HISTORY — PX: LEFT HEART CATH: CATH118248

## 2020-12-31 HISTORY — PX: VENTRICULAR ASSIST DEVICE INSERTION: CATH118273

## 2020-12-31 LAB — GLUCOSE, CAPILLARY
Glucose-Capillary: 79 mg/dL (ref 70–99)
Glucose-Capillary: 81 mg/dL (ref 70–99)
Glucose-Capillary: 118 mg/dL — ABNORMAL HIGH (ref 70–99)
Glucose-Capillary: 111 mg/dL — ABNORMAL HIGH (ref 70–99)
Glucose-Capillary: 211 mg/dL — ABNORMAL HIGH (ref 70–99)

## 2020-12-31 LAB — POCT ACTIVATED CLOTTING TIME
Activated Clotting Time: 279 seconds
Activated Clotting Time: 333 seconds
Activated Clotting Time: 309 seconds
Activated Clotting Time: 374 seconds
Activated Clotting Time: 249 seconds
Activated Clotting Time: 220 seconds

## 2020-12-31 LAB — BASIC METABOLIC PANEL
Anion gap: 8 (ref 5–15)
BUN: 32 mg/dL — ABNORMAL HIGH (ref 8–23)
CO2: 28 mmol/L (ref 22–32)
Calcium: 8.8 mg/dL — ABNORMAL LOW (ref 8.9–10.3)
Chloride: 106 mmol/L (ref 98–111)
Creatinine, Ser: 1.75 mg/dL — ABNORMAL HIGH (ref 0.61–1.24)
GFR, Estimated: 43 mL/min — ABNORMAL LOW (ref >60)
Glucose, Bld: 68 mg/dL — ABNORMAL LOW (ref 70–99)
Potassium: 4.9 mmol/L (ref 3.5–5.1)
Sodium: 142 mmol/L (ref 135–145)

## 2020-12-31 SURGERY — VENTRICULAR ASSIST DEVICE INSERTION
Anesthesia: LOCAL

## 2020-12-31 MED ORDER — LIDOCAINE HCL (PF) 1 % IJ SOLN
INTRAMUSCULAR | Status: AC
  Filled 2020-12-31: qty 30

## 2020-12-31 MED ORDER — SODIUM CHLORIDE 0.9% FLUSH
3.0000 mL | Freq: Two times a day (BID) | INTRAVENOUS | Status: DC
  Administered 2021-01-01: 3 mL via INTRAVENOUS

## 2020-12-31 MED ORDER — NITROGLYCERIN 1 MG/10 ML FOR IR/CATH LAB
INTRA_ARTERIAL | Status: AC
  Filled 2020-12-31: qty 10

## 2020-12-31 MED ORDER — FENTANYL CITRATE (PF) 100 MCG/2ML IJ SOLN
INTRAMUSCULAR | Status: AC
  Filled 2020-12-31: qty 2

## 2020-12-31 MED ORDER — HYDRALAZINE HCL 20 MG/ML IJ SOLN: 10.0000 mg | INTRAMUSCULAR | Status: AC | PRN

## 2020-12-31 MED ORDER — HEPARIN SODIUM (PORCINE) 1000 UNIT/ML IJ SOLN
INTRAMUSCULAR | Status: AC
  Filled 2020-12-31: qty 1

## 2020-12-31 MED ORDER — FENTANYL CITRATE (PF) 100 MCG/2ML IJ SOLN
INTRAMUSCULAR | Status: DC | PRN
  Administered 2020-12-31: 25 ug via INTRAVENOUS
  Administered 2020-12-31: 50 ug via INTRAVENOUS
  Administered 2020-12-31: 25 ug via INTRAVENOUS
  Administered 2020-12-31 (×2): 50 ug via INTRAVENOUS

## 2020-12-31 MED ORDER — SODIUM CHLORIDE 0.9 % IV SOLN: 250.0000 mL | INTRAVENOUS | Status: DC | PRN

## 2020-12-31 MED ORDER — SODIUM CHLORIDE 0.9 % WEIGHT BASED INFUSION: 1.0000 mL/kg/h | INTRAVENOUS | Status: DC

## 2020-12-31 MED ORDER — BIVALIRUDIN BOLUS VIA INFUSION - CUPID
INTRAVENOUS | Status: DC | PRN
  Administered 2020-12-31: 101.25 mg via INTRAVENOUS

## 2020-12-31 MED ORDER — SODIUM CHLORIDE 0.9% FLUSH: 3.0000 mL | INTRAVENOUS | Status: DC | PRN

## 2020-12-31 MED ORDER — ACETAMINOPHEN 325 MG PO TABS
650.0000 mg | ORAL_TABLET | ORAL | Status: DC | PRN
  Administered 2021-01-01: 650 mg via ORAL
  Filled 2020-12-31: qty 2

## 2020-12-31 MED ORDER — ONDANSETRON HCL 4 MG/2ML IJ SOLN: 4.0000 mg | Freq: Four times a day (QID) | INTRAMUSCULAR | Status: DC | PRN

## 2020-12-31 MED ORDER — FENTANYL CITRATE (PF) 100 MCG/2ML IJ SOLN
50.0000 ug | INTRAMUSCULAR | Status: DC | PRN
  Administered 2020-12-31 (×3): 50 ug via INTRAVENOUS
  Filled 2020-12-31 (×2): qty 2

## 2020-12-31 MED ORDER — ASPIRIN 81 MG PO CHEW: 81.0000 mg | CHEWABLE_TABLET | ORAL | Status: DC

## 2020-12-31 MED ORDER — VERAPAMIL HCL 2.5 MG/ML IV SOLN
INTRAVENOUS | Status: AC
  Filled 2020-12-31: qty 2

## 2020-12-31 MED ORDER — INSULIN ASPART 100 UNIT/ML ~~LOC~~ SOLN
0.0000 [IU] | Freq: Three times a day (TID) | SUBCUTANEOUS | Status: DC
  Administered 2021-01-01: 07:00:00 1 [IU] via SUBCUTANEOUS
  Administered 2021-01-01: 12:00:00 3 [IU] via SUBCUTANEOUS
  Administered 2021-01-01 – 2021-01-02 (×2): 2 [IU] via SUBCUTANEOUS

## 2020-12-31 MED ORDER — LABETALOL HCL 5 MG/ML IV SOLN: 10.0000 mg | INTRAVENOUS | Status: AC | PRN

## 2020-12-31 MED ORDER — SODIUM CHLORIDE 0.9% FLUSH
3.0000 mL | Freq: Two times a day (BID) | INTRAVENOUS | Status: DC
  Administered 2021-01-01 – 2021-01-02 (×3): 3 mL via INTRAVENOUS

## 2020-12-31 MED ORDER — SODIUM CHLORIDE 0.9% FLUSH: 3.0000 mL | Freq: Two times a day (BID) | INTRAVENOUS | Status: DC

## 2020-12-31 MED ORDER — BIVALIRUDIN TRIFLUOROACETATE 250 MG IV SOLR
INTRAVENOUS | Status: AC
  Filled 2020-12-31: qty 250

## 2020-12-31 MED ORDER — LIDOCAINE HCL (PF) 1 % IJ SOLN
INTRAMUSCULAR | Status: DC | PRN
  Administered 2020-12-31: 15 mL

## 2020-12-31 MED ORDER — HEPARIN (PORCINE) IN NACL 1000-0.9 UT/500ML-% IV SOLN
INTRAVENOUS | Status: AC
  Filled 2020-12-31: qty 1000

## 2020-12-31 MED ORDER — INSULIN NPH (HUMAN) (ISOPHANE) 100 UNIT/ML ~~LOC~~ SUSP
25.0000 [IU] | Freq: Two times a day (BID) | SUBCUTANEOUS | Status: DC
  Administered 2021-01-01 – 2021-01-02 (×3): 25 [IU] via SUBCUTANEOUS
  Filled 2020-12-31: qty 10

## 2020-12-31 MED ORDER — ASPIRIN 81 MG PO CHEW
81.0000 mg | CHEWABLE_TABLET | ORAL | Status: AC
  Administered 2020-12-31: 81 mg via ORAL
  Filled 2020-12-31: qty 1

## 2020-12-31 MED ORDER — SODIUM CHLORIDE 0.9 % IV SOLN: INTRAVENOUS | Status: AC

## 2020-12-31 MED ORDER — MIDAZOLAM HCL 2 MG/2ML IJ SOLN
INTRAMUSCULAR | Status: AC
  Filled 2020-12-31: qty 2

## 2020-12-31 MED ORDER — SODIUM CHLORIDE 0.9 % WEIGHT BASED INFUSION
3.0000 mL/kg/h | INTRAVENOUS | Status: AC
  Administered 2020-12-31: 3 mL/kg/h via INTRAVENOUS

## 2020-12-31 MED ORDER — CHLORHEXIDINE GLUCONATE CLOTH 2 % EX PADS
6.0000 | MEDICATED_PAD | Freq: Every day | CUTANEOUS | Status: DC
  Administered 2021-01-02: 6 via TOPICAL

## 2020-12-31 MED ORDER — MIDAZOLAM HCL 2 MG/2ML IJ SOLN
INTRAMUSCULAR | Status: DC | PRN
  Administered 2020-12-31 (×2): 1 mg via INTRAVENOUS
  Administered 2020-12-31: 2 mg via INTRAVENOUS

## 2020-12-31 MED ORDER — SODIUM CHLORIDE 0.9 % IV SOLN
INTRAVENOUS | Status: DC | PRN
  Administered 2020-12-31: 1.75 mg/kg/h via INTRAVENOUS

## 2020-12-31 SURGICAL SUPPLY — 23 items
CATH INFINITI 5FR ANG PIGTAIL (CATHETERS) ×2 IMPLANT
CATH LAUNCHER 6FR EBU3.5 (CATHETERS) ×2 IMPLANT
CATH TELEPORT (CATHETERS) ×2 IMPLANT
CATH VENTURE RX (CATHETERS) ×2 IMPLANT
CLOSURE PERCLOSE PROSTYLE (VASCULAR PRODUCTS) ×4 IMPLANT
ELECT DEFIB PAD ADLT CADENCE (PAD) ×2 IMPLANT
GUIDEWIRE SAF TJ AMPL .035X180 (WIRE) ×2 IMPLANT
KIT ENCORE 26 ADVANTAGE (KITS) ×2 IMPLANT
KIT HEART LEFT (KITS) ×2 IMPLANT
KIT HEMO VALVE WATCHDOG (MISCELLANEOUS) ×2 IMPLANT
KIT MICROPUNCTURE NIT STIFF (SHEATH) ×4 IMPLANT
MAT PREVALON FULL STRYKER (MISCELLANEOUS) ×2 IMPLANT
PACK CARDIAC CATHETERIZATION (CUSTOM PROCEDURE TRAY) ×2 IMPLANT
SET IMPELLA CP PUMP (CATHETERS) ×2 IMPLANT
SHEATH HYDRO PINNACLE 6FR 45 (SHEATH) ×2 IMPLANT
SHEATH PINNACLE 5F 10CM (SHEATH) ×4 IMPLANT
SHEATH PINNACLE 8F 10CM (SHEATH) ×2 IMPLANT
TRANSDUCER W/STOPCOCK (MISCELLANEOUS) ×2 IMPLANT
TUBING CIL FLEX 10 FLL-RA (TUBING) ×2 IMPLANT
WIRE ASAHI MIRACLEBROS-6 180CM (WIRE) ×2 IMPLANT
WIRE ASAHI PROWATER 180CM (WIRE) ×2 IMPLANT
WIRE EMERALD 3MM-J .035X150CM (WIRE) ×4 IMPLANT
WIRE HI TORQ WHISPER MS 190CM (WIRE) ×2 IMPLANT

## 2020-12-31 NOTE — Interval H&P Note (Signed)
History and Physical Interval Note:  12/31/2020 10:50 AM  Eduardo [name]  has presented today for surgery, with the diagnosis of CAD.  The various methods of treatment have been discussed with the patient and family. After consideration of risks, benefits and other options for treatment, the patient has consented to  Procedure(s): CORONARY ATHERECTOMY (N/A) VENTRICULAR ASSIST DEVICE INSERTION (N/A) CORONARY STENT INTERVENTION (N/A) as a surgical intervention.  The patient's history has been reviewed, patient examined, no change in status, stable for surgery.  I have reviewed the patient's chart and labs.  Questions were answered to the patient's satisfaction.    2016/2017 Appropriate Use Criteria for Coronary Revascularization Clinical Presentation: Diabetes Mellitus? Symptom Status? S/P CABG? Antianginal Therapy (# of long-acting drugs)? Results of Non-invasive testing? FFR/iFR results in all diseased vessels? Patient undergoing renal transplant? Patient undergoing percutaneous valve procedure (TAVR, MitraClip, Others)? Symptom Status:  Ischemic Symptoms  Non-invasive Testing:  High risk  If no or indeterminate stress test, FFR/iFR results in all diseased vessels:  N/A  Diabetes Mellitus:  Yes  S/P CABG:  No  Antianginal therapy (number of long-acting drugs):  >=2  Patient undergoing renal transplant:  No  Patient undergoing percutaneous valve procedure:  No    newline 1 Vessel Disease PCI CABG  No proximal LAD involvement, No proximal left dominant LCX involvement A (8); Indication 2 M (6); Indication 2   Proximal left dominant LCX involvement A (8); Indication 5 A (8); Indication 5   Proximal LAD involvement A (8); Indication 5 A (8); Indication 5   newline 2 Vessel Disease  No proximal LAD involvement A (8); Indication 8 A (7); Indication 8   Proximal LAD involvement A (8); Indication 14 A (9); Indication 14   newline 3 Vessel Disease  Low disease complexity (e.g., focal  stenoses, SYNTAX <=22) A (7); Indication 19 A (9); Indication 19   Intermediate or high disease complexity (e.g., SYNTAX >=23) M (6); Indication 23 A (9); Indication 23   newline Left Main Disease  Isolated LMCA disease: ostial or midshaft A (7); Indication 24 A (9); Indication 24   Isolated LMCA disease: bifurcation involvement M (6); Indication 25 A (9); Indication 25   LMCA ostial or midshaft, concurrent low disease burden multivessel disease (e.g., 1-2 additional focal stenoses, SYNTAX <=22) A (7); Indication 26 A (9); Indication 26   LMCA ostial or midshaft, concurrent intermediate or high disease burden multivessel disease (e.g., 1-2 additional bifurcation stenoses, long stenoses, SYNTAX >=23) M (4); Indication 27 A (9); Indication 27   LMCA bifurcation involvement, concurrent low disease burden multivessel disease (e.g., 1-2 additional focal stenoses, SYNTAX <=22) M (6); Indication 28 A (9); Indication 28   LMCA bifurcation involvement, concurrent intermediate or high disease burden multivessel disease (e.g., 1-2 additional bifurcation stenoses, long stenoses, SYNTAX >=23) R (3); Indication 29 A (9); Indication 29     Notes: A indicates appropriate. M indicates may be appropriate. R indicates rarely appropriate. Number in parentheses is median score for that indication. Reclassify indicates number of functionally diseased vessels should be decreased given negative FFR/iFR. Re-evaluate the scenario interpreting any FFR/iFR negative vessel as being not significantly stenosed. Disease means involved vessel provides flow to a sufficient amount of myocardium to be clinically important. If FFR testing indicates a vessel is not significant, that vessel should not be considered diseased (and the patient should be reclassified with respect to extent of functionally significant disease). Proximal LAD + proximal left dominant LCX is considered 3 vessel CAD  2 Vessel CAD with FFR/iFR abnormal in only 1 but  not both is considered 1 vessel CAD Disease complexity includes occlusion, bifurcation, trifurcation, ostial, >20 mm, tortuosity, calcification, thrombus LMCA disease is >=50% by angiography, MLD <2.8 mm, MLA <6 mm2; MLA 6-7.5 mm2 requires further physiologic See Table B for risk stratification based on noninvasive testing   Eduardo [name] Eduardo [name]

## 2020-12-31 NOTE — Progress Notes (Signed)
Groin check 5-6 minutes after dressing groin, revealed a hematoma aprox 3 inches above groin site. Manual pressure reapplied for 20 minutes. Site level 1 post. Hematoma expressed and not palpable. Slight swelling present, but stable, no palpable hematoma present.

## 2020-12-31 NOTE — Progress Notes (Signed)
12fr impella sheath aspirated and removed from right femoral artery. Manual pressure applied firmly for 30 minutes, then lightly for 10 minutes. 40 minutes total.   Groin level 0, n s+s of hematoma, tegaderm dressing applied, bedrest instructions given.  Slight skin tear present above insertion site, in the fold of his tummy panis.  Bilateral dp pulses palpable , left pt palpable, right pt present with doppler.  Bedrest begins at 18:00:00

## 2020-12-31 NOTE — Progress Notes (Signed)
Thank you so much and we cannot do work like this without support from you. Appreciate it.  JG

## 2021-01-01 ENCOUNTER — Encounter (HOSPITAL_COMMUNITY): Payer: Self-pay | Admitting: Cardiology

## 2021-01-01 LAB — CBC
HCT: 45.4 % (ref 39.0–52.0)
Hemoglobin: 14.9 g/dL (ref 13.0–17.0)
MCH: 30.1 pg (ref 26.0–34.0)
MCHC: 32.8 g/dL (ref 30.0–36.0)
MCV: 91.7 fL (ref 80.0–100.0)
Platelets: 136 10*3/uL — ABNORMAL LOW (ref 150–400)
RBC: 4.95 MIL/uL (ref 4.22–5.81)
RDW: 13.7 % (ref 11.5–15.5)
WBC: 8 10*3/uL (ref 4.0–10.5)
nRBC: 0 % (ref 0.0–0.2)

## 2021-01-01 LAB — BASIC METABOLIC PANEL
Anion gap: 11 (ref 5–15)
BUN: 25 mg/dL — ABNORMAL HIGH (ref 8–23)
CO2: 20 mmol/L — ABNORMAL LOW (ref 22–32)
Calcium: 8.5 mg/dL — ABNORMAL LOW (ref 8.9–10.3)
Chloride: 105 mmol/L (ref 98–111)
Creatinine, Ser: 1.5 mg/dL — ABNORMAL HIGH (ref 0.61–1.24)
GFR, Estimated: 51 mL/min — ABNORMAL LOW (ref >60)
Glucose, Bld: 182 mg/dL — ABNORMAL HIGH (ref 70–99)
Potassium: 5 mmol/L (ref 3.5–5.1)
Sodium: 136 mmol/L (ref 135–145)

## 2021-01-01 LAB — GLUCOSE, CAPILLARY
Glucose-Capillary: 133 mg/dL — ABNORMAL HIGH (ref 70–99)
Glucose-Capillary: 209 mg/dL — ABNORMAL HIGH (ref 70–99)
Glucose-Capillary: 166 mg/dL — ABNORMAL HIGH (ref 70–99)
Glucose-Capillary: 118 mg/dL — ABNORMAL HIGH (ref 70–99)

## 2021-01-01 MED FILL — Heparin Sod (Porcine)-NaCl IV Soln 1000 Unit/500ML-0.9%: INTRAVENOUS | Qty: 1000 | Status: AC

## 2021-01-01 MED FILL — Nitroglycerin IV Soln 100 MCG/ML in D5W: INTRA_ARTERIAL | Qty: 10 | Status: AC

## 2021-01-01 MED FILL — Verapamil HCl IV Soln 2.5 MG/ML: INTRAVENOUS | Qty: 2 | Status: AC

## 2021-01-01 NOTE — Progress Notes (Signed)
CARDIAC REHAB PHASE I   PRE:  Rate/Rhythm: 38 SR, PVC's  BP:  Supine:   Sitting: 129/74  Standing:    SaO2: 98 RA  MODE:  Ambulation: 810 ft   POST:  Rate/Rhythm: 93 SR with PVC's  BP:  Supine:   Sitting: 140/88  Standing:    SaO2: 100 RA  Pt tolerated exercise well with standby assist and amb 810 ft without c/o SOB, CP or SOB. Pt was returned to bed with call bell in reach. Reinforced Heart failure education with Na and fluid restrictions, and daily weights.  Will continue to follow.   [id number] Kirby Funk ACSM-EP 01/01/2021 2:53 PM

## 2021-01-01 NOTE — Progress Notes (Signed)
Subjective:  States that his dyspnea is improved.  He has not had any chest pain or palpitations.  Intake/Output from previous day:  I/O last 3 completed shifts: In: 1090.3 [P.O.:680; I.V.:410.3] Out: 2850 [Urine:2850] No intake/output data recorded.  Blood pressure 124/79, pulse 88, temperature 98.3 F (36.8 C), temperature source Oral, resp. rate 20, height 5' 10"  (1.778 m), weight (!) 137.1 kg, SpO2 96 %. Body mass index is 43.37 kg/m.  Vitals with BMI 01/01/2021 01/01/2021 01/01/2021  Height - - -  Weight - - 302 lbs 4 oz  BMI - - 09.32  Systolic 355 732 202  Diastolic 79 80 69  Pulse 88 70 84    Physical Exam Constitutional:      Appearance: He is well-developed.     Comments: Morbidly obese, in no acute distress  HENT:     Head: Atraumatic.  Neck:     Thyroid: No thyromegaly.     Comments: Short neck and difficult to evaluate JVP Cardiovascular:     Pulses:          Carotid pulses are 2+ on the right side and 2+ on the left side.      Popliteal pulses are 0 on the right side and 2+ on the left side.       Dorsalis pedis pulses are 1+ on the right side and 2+ on the left side.       Posterior tibial pulses are 0 on the right side and 1+ on the left side.     Heart sounds: No murmur heard. No gallop.      Comments: Femoral and popliteal pulse difficult to feel due to patient's body habitus. Pulmonary:     Effort: Pulmonary effort is normal.     Breath sounds: Rales (Scattered especially at the bases) present.  Abdominal:     Palpations: Abdomen is soft.     Comments: Obese. Pannus present  Musculoskeletal:        General: Normal range of motion.     Cervical back: Neck supple.  Skin:    General: Skin is warm and dry.     Capillary Refill: Capillary refill takes less than 2 seconds.  Neurological:     General: No focal deficit present.     Mental Status: He is alert and oriented to person, place, and time.     Lab Results: BMP BNP (last 3 results) Recent  Labs    10/22/20 1605 11/12/20 1350 12/28/20 1108  BNP 460.7* 630.2* 1,146.7*    BMP Latest Ref Rng & Units 01/01/2021 12/31/2020 12/30/2020  Glucose 70 - 99 mg/dL 182(H) 68(L) 45(L)  BUN 8 - 23 mg/dL 25(H) 32(H) 36(H)  Creatinine 0.61 - 1.24 mg/dL 1.50(H) 1.75(H) 1.84(H)  BUN/Creat Ratio 10 - 24 - - -  Sodium 135 - 145 mmol/L 136 142 145  Potassium 3.5 - 5.1 mmol/L 5.0 4.9 3.4(L)  Chloride 98 - 111 mmol/L 105 106 105  CO2 22 - 32 mmol/L 20(L) 28 30  Calcium 8.9 - 10.3 mg/dL 8.5(L) 8.8(L) 9.1   Hepatic Function Latest Ref Rng & Units 12/28/2020 10/22/2020 10/11/2020  Total Protein 6.5 - 8.1 g/dL 7.0 6.6 6.2(L)  Albumin 3.5 - 5.0 g/dL 3.9 3.9 3.4(L)  AST 15 - 41 U/L 50(H) 31 124(H)  ALT 0 - 44 U/L 36 40 35  Alk Phosphatase 38 - 126 U/L 63 72 65  Total Bilirubin 0.3 - 1.2 mg/dL 0.8 0.6 0.8   CBC Latest Ref Rng &  Units 01/01/2021 12/28/2020 10/22/2020  WBC 4.0 - 10.5 K/uL 8.0 9.9 7.1  Hemoglobin 13.0 - 17.0 g/dL 14.9 17.2(H) 15.6  Hematocrit 39.0 - 52.0 % 45.4 52.9(H) 46.2  Platelets 150 - 400 K/uL 136(L) 186 259   Lipid Panel     Component Value Date/Time   CHOL 137 06/24/2020 0937   TRIG 144 10/12/2020 0511   HDL 33 (L) 06/24/2020 0937   CHOLHDL 4.2 06/24/2020 0937   CHOLHDL 4.4 09/28/2011 1358   VLDL 21 09/28/2011 1358   LDLCALC 72 06/24/2020 0937   Cardiac Panel (last 3 results) No results for input(s): CKTOTAL, CKMB, TROPONINI, RELINDX in the last 72 hours.  HEMOGLOBIN A1C Lab Results  Component Value Date   HGBA1C 6.7 11/19/2020   MPG 182.9 10/11/2020   TSH Recent Labs    06/24/20 0937  TSH 1.650    Component Ref Range & Units 2 d ago  (12/28/20) 2 d ago  (12/28/20) 2 mo ago  (10/11/20) 2 mo ago  (10/11/20) 2 mo ago  (10/11/20)  Troponin I (High Sensitivity) <18 ng/L 46High  23High CM          Cardiac Studies:  EKG:    EKG 12/28/2020: Normal sinus rhythm with rate of 93 bpm, left atrial enlargement, left bundle branch block.  PVCs (1).    Left  Heart Catheterization 10/11/20: LV: Severely dilated. Global hypokinesis. Hand contrast injection hence not fully adequately visualized. LVEF 15 to 20%. EDP markedly elevated at 29 mmHg. No pressure gradient across the aortic valve. Left main: Normal. LAD: Severely diffusely diseased. Gives origin to large D1 which gives collaterals to the LAD and 2 smaller diagonals, LAD is occluded after the origin of D1, anatomy compared to prior in 2017 reveals progression of diffuse disease. There are ipsilateral and contralateral collaterals to the LAD. CX: Moderate sized vessel, giving origin to large OM1. OM1 is occluded in the ostium as was noted previously and has ipsilateral collaterals. OM1 is diffusely diseased and faintly filled.  RCA: Moderate disease in the proximal and mid segment. At the bifurcation of PDA and PL, there is a high-grade 90% stenosis. The bifurcation is also involved with at least a 90% stenosis in the PDA and a 60 to 70% stenosis in the PL branch. There is no target as distally as the PL branch which is large is occluded distally that was also noted in 2017. There are faint collaterals noted from the left to the RCA.  Impression: Severe native vessel three-vessel coronary artery disease with no significant targets for revascularization, LAD may be amenable for revascularization but I am not sure that this would help. Patient is extremely ill with high risk for mortality. He also has underlying stage III-IV kidney disease and contrast nephropathy needs to be evaluated further in view of contrast load of 70 mL. I try to reach his friend on the contact list, unable to reach.  Echocardiogram 12/29/2020:   1. Left ventricular ejection fraction, by estimation, is <20%. The left ventricle has severely decreased function. The left ventricle demonstrates regional wall motion abnormalities (see scoring diagram/findings for description). The left ventricular internal cavity size  was moderately dilated. There is mild left ventricular hypertrophy. Left ventricular diastolic parameters are consistent with Grade II diastolic dysfunction (pseudonormalization). Elevated left atrial pressure. Akinetic inferolateral and apical myocardium. Moderate anteroseptal hypokinesis. 2. Right ventricular systolic function is low normal. The right ventricular size is normal. 3. The mitral valve is grossly normal. Mild to moderate mitral valve  regurgitation. 4. The aortic valve is tricuspid. Aortic valve regurgitation is not visualized. 5. The inferior vena cava is dilated in size with <50% respiratory variability, suggesting right atrial pressure of 15 mmHg. 6. Compared to previous study in 10/12/2020, LVEF has further decreased from 20-25% to 15-20%.  No current facility-administered medications on file prior to encounter.   Current Outpatient Medications on File Prior to Encounter  Medication Sig Dispense Refill  . acetaminophen (TYLENOL) 325 MG tablet Take 650 mg by mouth every 6 (six) hours as needed for mild pain (or headaches).    Marland Kitchen aspirin 81 MG chewable tablet Chew 1 tablet (81 mg total) by mouth daily.    Marland Kitchen Bioflavonoid Products (ESTER C PO) Take 1,000 mg by mouth daily.    . carvedilol (COREG) 6.25 MG tablet Take 6.25 mg by mouth 2 (two) times daily with a meal.    . Cholecalciferol (VITAMIN D) 2000 units CAPS Take 2,000 Units by mouth daily.    . clopidogrel (PLAVIX) 75 MG tablet Take 1 tablet (75 mg total) by mouth daily. 90 tablet 1  . Continuous Blood Gluc Sensor (FREESTYLE LIBRE 14 DAY SENSOR) MISC every 14 (fourteen) days.    . empagliflozin (JARDIANCE) 10 MG TABS tablet Take 1 tablet (10 mg total) by mouth daily. 90 tablet 3  . furosemide (LASIX) 40 MG tablet Take 1 tablet (40 mg total) by mouth daily. (Patient taking differently: Take 40-80 mg by mouth See admin instructions. Take 80 mg by mouth in the morning when retaining fluid and decrease to 40 mg when resolved) 30  tablet 1  . gemfibrozil (LOPID) 600 MG tablet Take 600 mg by mouth 2 (two) times daily before a meal.    . HUMALOG KWIKPEN 100 UNIT/ML KiwkPen Inject 0-10 Units into the skin See admin instructions. Inject 0-10 units into the skin three times a day, per sliding scale- based on BGL >100  1  . HUMULIN N KWIKPEN 100 UNIT/ML Kiwkpen Inject 30 Units into the skin See admin instructions. Inject 30 units into the skin before breakfast and supper  1  . isosorbide mononitrate (IMDUR) 60 MG 24 hr tablet Take 1 tablet (60 mg total) by mouth daily. 90 tablet 1  . Multiple Vitamin (MULTIVITAMIN WITH MINERALS) TABS tablet Take 1 tablet by mouth daily.    . nitroGLYCERIN (NITROSTAT) 0.4 MG SL tablet Place 0.4 mg under the tongue every 5 (five) minutes as needed for chest pain.  1  . Omega-3 Fatty Acids (FISH OIL PO) Take 350 mg by mouth daily with breakfast.    . Probiotic Product (PROBIOTIC-10 PO) Take 1 capsule by mouth daily.    . rosuvastatin (CRESTOR) 20 MG tablet Take 1 tablet (20 mg total) by mouth daily. 90 tablet 3  . sacubitril-valsartan (ENTRESTO) 49-51 MG Take 0.5 tablets by mouth 2 (two) times daily.    . sodium zirconium cyclosilicate (LOKELMA) 10 g PACK packet Take 10 g by mouth daily. 33 each 1  . B-D UF III MINI PEN NEEDLES 31G X 5 MM MISC 6 (six) times daily.  0  . carvedilol (COREG) 12.5 MG tablet Take 1 tablet (12.5 mg total) by mouth 2 (two) times daily with a meal. (Patient not taking: No sig reported) 180 tablet 3  . doxycycline (VIBRA-TABS) 100 MG tablet Take 1 tablet (100 mg total) by mouth 2 (two) times daily. (Patient not taking: No sig reported) 20 tablet 0    Scheduled Meds: . aspirin  81 mg Oral Daily  .  aspirin  81 mg Oral Pre-Cath  . Chlorhexidine Gluconate Cloth  6 each Topical Daily  . clopidogrel  75 mg Oral Daily  . empagliflozin  10 mg Oral Daily  . enoxaparin (LOVENOX) injection  60 mg Subcutaneous Q24H  . gemfibrozil  600 mg Oral BID AC  . insulin aspart  0-9 Units  Subcutaneous TID WC  . insulin aspart  3 Units Subcutaneous TID WC  . insulin NPH Human  25 Units Subcutaneous BID AC & HS  . isosorbide mononitrate  60 mg Oral Daily  . rosuvastatin  20 mg Oral Daily  . sacubitril-valsartan  1 tablet Oral BID  . sodium chloride flush  3 mL Intravenous Q12H  . sodium chloride flush  3 mL Intravenous Q12H  . sodium chloride flush  3 mL Intravenous Q12H  . spironolactone  25 mg Oral Daily   Continuous Infusions: . sodium chloride    . sodium chloride    . sodium chloride     PRN Meds:.sodium chloride, sodium chloride, sodium chloride, acetaminophen, fentaNYL, lip balm, ondansetron (ZOFRAN) IV, sodium chloride flush, sodium chloride flush, sodium chloride flush  Assessment/Plan:  1.  Acute on chronic systolic and diastolic heart failure which is unstable angina equivalent 2.  Unstable angina pectoris 3.  Severe multivessel coronary artery disease, not a candidate for CABG as there is no targets. 4.  Severe ischemic cardiomyopathy, patient had refused LVAD evaluation.  Recommendation: I have reviewed the coronary angiography from 10/11/2020.  The only option is to try to revascularize his LAD in the hopes of improving his LVEF and longevity.  High risk intervention discussed with the patient, he will need Impella circulatory assist.  He also has peripheral arterial disease and this needs to be kept in mind during access.  He does not appear to have any other options except conservative therapy and consider palliative care.  Patient is only 65 years of age.  I discussed the high risk intervention with the patient, is willing to proceed and understands 2 to 3% risk of death, stroke, acute ischemic leg, renal failure, bleeding but not limited to this.    Adrian Prows, MD, [facility] 01/01/2021, 9:48 AM Office: [phone] Pager: [phone]

## 2021-01-02 ENCOUNTER — Ambulatory Visit: Payer: Medicare Other | Admitting: Student

## 2021-01-02 ENCOUNTER — Other Ambulatory Visit: Payer: Self-pay | Admitting: Cardiology

## 2021-01-02 LAB — BASIC METABOLIC PANEL
Anion gap: 10 (ref 5–15)
BUN: 26 mg/dL — ABNORMAL HIGH (ref 8–23)
CO2: 24 mmol/L (ref 22–32)
Calcium: 8.9 mg/dL (ref 8.9–10.3)
Chloride: 105 mmol/L (ref 98–111)
Creatinine, Ser: 1.47 mg/dL — ABNORMAL HIGH (ref 0.61–1.24)
GFR, Estimated: 53 mL/min — ABNORMAL LOW (ref >60)
Glucose, Bld: 46 mg/dL — ABNORMAL LOW (ref 70–99)
Potassium: 3.9 mmol/L (ref 3.5–5.1)
Sodium: 139 mmol/L (ref 135–145)

## 2021-01-02 LAB — GLUCOSE, CAPILLARY
Glucose-Capillary: 83 mg/dL (ref 70–99)
Glucose-Capillary: 159 mg/dL — ABNORMAL HIGH (ref 70–99)

## 2021-01-02 MED ORDER — ENTRESTO 24-26 MG PO TABS: 1.0000 | ORAL_TABLET | Freq: Two times a day (BID) | ORAL | 1 refills | Status: DC

## 2021-01-02 MED ORDER — TORSEMIDE 20 MG PO TABS: 20.0000 mg | ORAL_TABLET | Freq: Two times a day (BID) | ORAL | 1 refills | Status: DC

## 2021-01-02 MED ORDER — CARVEDILOL 6.25 MG PO TABS: 3.1250 mg | ORAL_TABLET | Freq: Two times a day (BID) | ORAL | 0 refills | Status: DC

## 2021-01-02 MED ORDER — TORSEMIDE 20 MG PO TABS
20.0000 mg | ORAL_TABLET | Freq: Two times a day (BID) | ORAL | Status: DC
  Administered 2021-01-02: 20 mg via ORAL
  Filled 2021-01-02: qty 1

## 2021-01-02 MED ORDER — POTASSIUM CHLORIDE ER 20 MEQ PO TBCR: 20.0000 meq | EXTENDED_RELEASE_TABLET | Freq: Every day | ORAL | 3 refills | Status: DC

## 2021-01-02 MED ORDER — SPIRONOLACTONE 25 MG PO TABS: 25.0000 mg | ORAL_TABLET | Freq: Every day | ORAL | 1 refills | Status: DC

## 2021-01-02 MED FILL — SPIRONOLACTONE 25 MG TABLET: 25 | 30 days supply | Qty: 30 | Fill #0

## 2021-01-02 MED FILL — CARVEDILOL 3.125 MG TABLET: 3.125 | 30 days supply | Qty: 60 | Fill #0

## 2021-01-02 MED FILL — POTASSIUM CHLORIDE 20meqER: 20 | 30 days supply | Qty: 30 | Fill #0

## 2021-01-02 MED FILL — ENTRESTO 24 MG-26 MG TABLET: 24-26 | 30 days supply | Qty: 60 | Fill #0

## 2021-01-02 MED FILL — TORSEMIDE 20 MG TABLET: 20 | 30 days supply | Qty: 60 | Fill #0

## 2021-01-02 NOTE — Discharge Summary (Signed)
Physician Discharge Summary  Patient ID: Eduardo [name] MRN: [id number] DOB/AGE: Jun 16, 1956 65 y.o.  Admit date: 12/28/2020 Discharge date: 01/02/2021  Primary Discharge Diagnosis:  Acute on chronic systolic heart failure Coronary artery disease w/other forms of angina  Secondary Discharge Diagnosis: Type 2 diabetes melitus Obesity Peripheral artery disease   Hospital Course:   65 y.o. Caucasian male  with hypertension, type 2 diabetes mellitus, multivessel coronary artery disease, ischemic cardiomyopathy with systolic heart failure EF 20-25%, PAD, admitted with acute on chronic systolic heart failure.  Over the next 5 days, patient with IV diuresis with Lasix, and was started on spironolactone with excellent diuresis.  His leg edema completely resolved her breathing significantly improved.  Given ischemic cardiomyopathy being the etiology, and patient not being a candidate for CABG due to no targets and obesity, we attempted high risk PCI to LAD CTO with Impella assistance. However, unfortunately we were unsuccessful.  There were no patient related complications.  He was discharged on addition of spironolactone, switch from lasix to torsemide 20 mg bid, reduction in carvedilol dose to 3.125 mg bid. On the day of discharge, patient ambulated without any significant chest pain or shortness of breath.  Of note, he is not a candidate for transplant.  He has refused that option.  His management remains conservative medical.  In future, could consider palliative care involvement.   Discharge Exam: Blood pressure 108/60, pulse 69, temperature 98 F (36.7 C), temperature source Oral, resp. rate 18, height 5\' 10"  (1.778 m), weight 133.5 kg, SpO2 97 %.    Constitutional:      Appearance: He is well-developed.     Comments: Morbidly obese, in no acute distress  HENT:     Head: Atraumatic.  Neck:     Thyroid: No thyromegaly.     Comments: Short neck and difficult to evaluate  JVP Cardiovascular:     Pulses:          Carotid pulses are 2+ on the right side and 2+ on the left side.      Popliteal pulses are 0 on the right side and 2+ on the left side.       Dorsalis pedis pulses are 1+ on the right side and 2+ on the left side.       Posterior tibial pulses are 0 on the right side and 1+ on the left side.     Heart sounds: No murmur heard. No gallop.      Comments: Femoral and popliteal pulse difficult to feel due to patient's body habitus. Pulmonary:     Effort: Pulmonary effort is normal.     Breath sounds: Rales (Scattered especially at the bases) present.  Abdominal:     Palpations: Abdomen is soft.     Comments: Obese. Pannus present  Musculoskeletal:        General: Normal range of motion.     Cervical back: Neck supple.  Skin:    General: Skin is warm and dry.     Capillary Refill: Capillary refill takes less than 2 seconds.  Neurological:     General: No focal deficit present.     Mental Status: He is alert and oriented to person, place, and time.     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 129 ml contrast used. No immediate complications.  Continue guideline directed medical therapy for heart failure discuss regarding any future attempts of revascularization.  Echocardiogram 12/29/2020: 1. Left ventricular ejection fraction, by estimation, is <20%. The left  ventricle has severely decreased function. The left ventricle demonstrates  regional wall motion abnormalities (see scoring diagram/findings for  description). The left ventricular  internal cavity size was moderately dilated. There is mild left  ventricular  hypertrophy. Left ventricular diastolic parameters are  consistent with Grade II diastolic dysfunction (pseudonormalization).  Elevated left atrial pressure. Akinetic inferolateral  and apical myocardium. Moderate anteroseptal hypokinesis.  2. Right ventricular systolic function is low normal. The right  ventricular size is normal.  3. The mitral valve is grossly normal. Mild to moderate mitral valve  regurgitation.  4. The aortic valve is tricuspid. Aortic valve regurgitation is not  visualized.  5. The inferior vena cava is dilated in size with <50% respiratory  variability, suggesting right atrial pressure of 15 mmHg.  6. Compared to previous study in 10/2020, LVEF has further decreased from  20-25% to 15-20%.    Labs:   Lab Results  Component Value Date   WBC 8.0 01/01/2021   HGB 14.9 01/01/2021   HCT 45.4 01/01/2021   MCV 91.7 01/01/2021   PLT 136 (L) 01/01/2021    Recent Labs  Lab 12/28/20 1108 12/29/20 0323 01/02/21 0038  NA 142   < > 139  K 4.2   < > 3.9  CL 108   < > 105  CO2 23   < > 24  BUN 42*   < > 26*  CREATININE 1.71*   < > 1.47*  CALCIUM 9.2   < > 8.9  PROT 7.0  --   --   BILITOT 0.8  --   --   ALKPHOS 63  --   --   ALT 36  --   --   AST 50*  --   --   GLUCOSE 195*   < > 46*   < > = values in this interval not displayed.    Lipid Panel     Component Value Date/Time   CHOL 137 06/24/2020 0937   TRIG 144 10/12/2020 0511   HDL 33 (L) 06/24/2020 0937   CHOLHDL 4.2 06/24/2020 0937   CHOLHDL 4.4 09/28/2011 1358   VLDL 21 09/28/2011 1358   LDLCALC 72 06/24/2020 0937    BNP (last 3 results) Recent Labs    10/22/20 1605 11/12/20 1350 12/28/20 1108  BNP 460.7* 630.2* 1,146.7*   Trop HS 23--->46  HEMOGLOBIN A1C Lab Results  Component Value Date   HGBA1C 6.7 11/19/2020   MPG 182.9 10/11/2020      TSH Recent Labs    06/24/20 0937  TSH 1.650    Radiology: CARDIAC CATHETERIZATION  Result Date: 12/31/2020 Coronary  Angioplasty 12/31/20 Impella support high risk intervention Unsuccessful attempt at proximal LAD CTO intervention in spite of use of support catheters and multiple wires. 129 ml contrast used. No immediate complications. Continue guideline directed medical therapy for heart failure discuss regarding any future attempts of revascularization. Adrian Prows, MD, [facility] 12/31/2020, 3:15 PM Office: [phone] Pager: [phone]   DG Chest Port 1 View  Result Date: 12/28/2020 CLINICAL DATA:  Dyspnea. EXAM: PORTABLE CHEST 1 VIEW COMPARISON:  10/12/2020 FINDINGS: Normal cardiomediastinal contours. Diffusely increased interstitial markings are identified bilaterally compatible with interstitial edema. No focal airspace opacities. Visualized osseous structures appear intact.  IMPRESSION: Mild diffuse edema. Electronically Signed   By: Kerby Moors M.D.   On: 12/28/2020 11:42   DG Foot Complete Right  Result Date: 12/19/2020 Please see detailed radiograph report in office note.  ECHOCARDIOGRAM COMPLETE  Result Date: 12/29/2020    ECHOCARDIOGRAM REPORT   Patient Name:   GABRIELL CASIMIR Date of Exam: 12/29/2020 Medical Rec #:  [id number]    Height:       70.0 in Accession #:    [id number]   Weight:       297.4 lb Date of Birth:  Dec 27, 1955   BSA:          2.471 m Patient Age:    85 years     BP:           92/59 mmHg Patient Gender: M            HR:           67 bpm. Exam Location:  Inpatient Procedure: 2D Echo, Cardiac Doppler, Color Doppler and Intracardiac            Opacification Agent Indications:     CHF-Acute Systolic  History:         Patient has prior history of Echocardiogram examinations, most                  recent 10/12/2020. CAD; Risk Factors:Hypertension, Dyslipidemia                  and Diabetes. CKD.  Sonographer:     Clayton Lefort RDCS (AE) Referring Phys:  [id number] [facility] [place]lang Diagnosing Phys: Vernell Leep MD  Sonographer Comments: Technically difficult study due to poor echo windows, suboptimal  parasternal window, patient is morbidly obese and suboptimal subcostal window. Image acquisition challenging due to patient body habitus. IMPRESSIONS  1. Left ventricular ejection fraction, by estimation, is <20%. The left ventricle has severely decreased function. The left ventricle demonstrates regional wall motion abnormalities (see scoring diagram/findings for description). The left ventricular internal cavity size was moderately dilated. There is mild left ventricular hypertrophy. Left ventricular diastolic parameters are consistent with Grade II diastolic dysfunction (pseudonormalization). Elevated left atrial pressure. Akinetic inferolateral  and apical myocardium. Moderate anteroseptal hypokinesis.  2. Right ventricular systolic function is low normal. The right ventricular size is normal.  3. The mitral valve is grossly normal. Mild to moderate mitral valve regurgitation.  4. The aortic valve is tricuspid. Aortic valve regurgitation is not visualized.  5. The inferior vena cava is dilated in size with <50% respiratory variability, suggesting right atrial pressure of 15 mmHg.  6. Compared to previous study in 10/2020, LVEF has further decreased from 20-25% to 15-20%. FINDINGS  Left Ventricle: Left ventricular ejection fraction, by estimation, is <20%. The left ventricle has severely decreased function. The left ventricle demonstrates regional wall motion abnormalities. Definity contrast agent was given IV to delineate the left ventricular endocardial borders. The left ventricular internal cavity size was moderately dilated. There is mild left ventricular hypertrophy. Left ventricular diastolic parameters are consistent with Grade II diastolic dysfunction (pseudonormalization). Elevated left atrial pressure.  LV Wall Scoring: Akinetic inferolateral and apical myocardium. Moderate anteroseptal hypokinesis. Right Ventricle: The right ventricular size is normal. No increase in right ventricular wall thickness.  Right ventricular systolic function is low normal. Left Atrium: Left atrial size was normal in size. Right Atrium: Right atrial size was normal in size. Pericardium: There is no evidence of pericardial effusion. Mitral Valve: The mitral valve is  grossly normal. Mild to moderate mitral valve regurgitation. Tricuspid Valve: The tricuspid valve is grossly normal. Tricuspid valve regurgitation is not demonstrated. Aortic Valve: The aortic valve is tricuspid. Aortic valve regurgitation is not visualized. Aortic valve mean gradient measures 3.0 mmHg. Aortic valve peak gradient measures 5.1 mmHg. Aortic valve area, by VTI measures 3.22 cm. Pulmonic Valve: The pulmonic valve was grossly normal. Pulmonic valve regurgitation is not visualized. Aorta: The aortic root and ascending aorta are structurally normal, with no evidence of dilitation. Venous: The inferior vena cava is dilated in size with less than 50% respiratory variability, suggesting right atrial pressure of 15 mmHg. IAS/Shunts: No atrial level shunt detected by color flow Doppler.  LEFT VENTRICLE PLAX 2D LVIDd:         6.00 cm  Diastology LVIDs:         5.00 cm  LV e' medial:   4.81 cm/s LV PW:         1.40 cm  LV E/e' medial: 18.4 LV IVS:        1.30 cm LVOT diam:     2.00 cm LV SV:         66 LV SV Index:   27 LVOT Area:     3.14 cm  RIGHT VENTRICLE             IVC RV Basal diam:  3.00 cm     IVC diam: 2.50 cm RV S prime:     10.60 cm/s TAPSE (M-mode): 1.8 cm LEFT ATRIUM             Index       RIGHT ATRIUM           Index LA diam:        4.30 cm 1.74 cm/m  RA Area:     13.20 cm LA Vol (A2C):   51.8 ml 20.97 ml/m RA Volume:   30.50 ml  12.34 ml/m LA Vol (A4C):   74.3 ml 30.07 ml/m LA Biplane Vol: 65.8 ml 26.63 ml/m  AORTIC VALVE AV Area (Vmax):    2.61 cm AV Area (Vmean):   3.03 cm AV Area (VTI):     3.22 cm AV Vmax:           113.00 cm/s AV Vmean:          72.700 cm/s AV VTI:            0.204 m AV Peak Grad:      5.1 mmHg AV Mean Grad:      3.0 mmHg  LVOT Vmax:         94.00 cm/s LVOT Vmean:        70.100 cm/s LVOT VTI:          0.209 m LVOT/AV VTI ratio: 1.02  AORTA Ao Root diam: 3.30 cm Ao Asc diam:  3.10 cm MITRAL VALVE MV Area (PHT): 3.42 cm    SHUNTS MV Decel Time: 222 msec    Systemic VTI:  0.21 m MR Peak grad: 52.7 mmHg    Systemic Diam: 2.00 cm MR Mean grad: 29.0 mmHg MR Vmax:      363.00 cm/s MR Vmean:     247.0 cm/s MV E velocity: 88.30 cm/s MV A velocity: 81.00 cm/s MV E/A ratio:  1.09 Tresa Jolley MD Electronically signed by Vernell Leep MD Signature Date/Time: 12/29/2020/9:49:01 PM    Final       FOLLOW UP PLANS AND APPOINTMENTS Discharge Instructions    (HEART FAILURE  PATIENTS) Call MD:  Anytime you have any of the following symptoms: 1) 3 pound weight gain in 24 hours or 5 pounds in 1 week 2) shortness of breath, with or without a dry hacking cough 3) swelling in the hands, feet or stomach 4) if you have to sleep on extra pillows at night in order to breathe.   Complete by: As directed    Amb Referral to Cardiac Rehabilitation   Complete by: As directed    Diagnosis: Heart Failure (see criteria below if ordering Phase II)   Heart Failure Type: Chronic Systolic & Diastolic   After initial evaluation and assessments completed: Virtual Based Care may be provided alone or in conjunction with Phase 2 Cardiac Rehab based on patient barriers.: Yes   Diet - low sodium heart healthy   Complete by: As directed    Increase activity slowly   Complete by: As directed    Leave dressing on - Keep it clean, dry, and intact until clinic visit   Complete by: As directed      Allergies as of 01/02/2021      Reactions   Shellfish-derived Products Nausea And Vomiting, Other (See Comments)   Mussels cause severe nausea and vomiting   Latex Rash   Tape Rash, Other (See Comments)   Caused issues with the skin   Testosterone Rash      Medication List    STOP taking these medications   Entresto 49-51 MG Generic drug:  sacubitril-valsartan Replaced by: Delene Loll 24-26 MG   furosemide 40 MG tablet Commonly known as: LASIX   Lokelma 10 g Pack packet Generic drug: sodium zirconium cyclosilicate     TAKE these medications   acetaminophen 325 MG tablet Commonly known as: TYLENOL Take 650 mg by mouth every 6 (six) hours as needed for mild pain (or headaches).   aspirin 81 MG chewable tablet Chew 1 tablet (81 mg total) by mouth daily.   B-D UF III MINI PEN NEEDLES 31G X 5 MM Misc Generic drug: Insulin Pen Needle 6 (six) times daily.   carvedilol 6.25 MG tablet Commonly known as: COREG Take 0.5 tablets (3.125 mg total) by mouth 2 (two) times daily with a meal. What changed:   how much to take  Another medication with the same name was removed. Continue taking this medication, and follow the directions you see here.   clopidogrel 75 MG tablet Commonly known as: PLAVIX Take 1 tablet (75 mg total) by mouth daily.   doxycycline 100 MG tablet Commonly known as: VIBRA-TABS Take 1 tablet (100 mg total) by mouth 2 (two) times daily.   empagliflozin 10 MG Tabs tablet Commonly known as: JARDIANCE Take 1 tablet (10 mg total) by mouth daily.   Entresto 24-26 MG Generic drug: sacubitril-valsartan Take 1 tablet by mouth 2 (two) times daily. Replaces: Entresto 49-51 MG   ESTER C PO Take 1,000 mg by mouth daily.   FISH OIL PO Take 350 mg by mouth daily with breakfast.   FreeStyle Libre 14 Day Sensor Misc every 14 (fourteen) days.   gemfibrozil 600 MG tablet Commonly known as: LOPID Take 600 mg by mouth 2 (two) times daily before a meal.   HumaLOG KwikPen 100 UNIT/ML KwikPen Generic drug: insulin lispro Inject 0-10 Units into the skin See admin instructions. Inject 0-10 units into the skin three times a day, per sliding scale- based on BGL >100   HumuLIN N KwikPen 100 UNIT/ML Kiwkpen Generic drug: Insulin NPH (Human) (Isophane) Inject 30  Units into the skin See admin instructions. Inject 30  units into the skin before breakfast and supper   isosorbide mononitrate 60 MG 24 hr tablet Commonly known as: IMDUR Take 1 tablet (60 mg total) by mouth daily.   multivitamin with minerals Tabs tablet Take 1 tablet by mouth daily.   nitroGLYCERIN 0.4 MG SL tablet Commonly known as: NITROSTAT Place 0.4 mg under the tongue every 5 (five) minutes as needed for chest pain.   Potassium Chloride ER 20 MEQ Tbcr Take 20 mEq by mouth daily.   PROBIOTIC-10 PO Take 1 capsule by mouth daily.   rosuvastatin 20 MG tablet Commonly known as: CRESTOR Take 1 tablet (20 mg total) by mouth daily.   spironolactone 25 MG tablet Commonly known as: ALDACTONE Take 1 tablet (25 mg total) by mouth daily.   torsemide 20 MG tablet Commonly known as: DEMADEX Take 1 tablet (20 mg total) by mouth 2 (two) times daily at 10 am and 4 pm.   Vitamin D 50 MCG (2000 UT) Caps Take 2,000 Units by mouth daily.            Discharge Care Instructions  (From admission, onward)         Start     Ordered   01/02/21 0000  Leave dressing on - Keep it clean, dry, and intact until clinic visit        01/02/21 0806          Follow-up Information    Alethia Berthold, PA-C Follow up on 01/07/2021.   Specialty: Cardiology Why: 9 AM Contact information: [place] [address] [phone]                 Nigel Mormon, MD Pager: [phone] Office: [phone]

## 2021-01-02 NOTE — Progress Notes (Signed)
CARDIAC REHAB PHASE I   PRE:  Rate/Rhythm: 60 SR with PVCs  BP:  Sitting: 119/73      SaO2: 97 RA  MODE:  Ambulation: 740 ft   POST:  Rate/Rhythm: 98 SR with PVCs  BP:  Sitting: 138/73    SaO2: 95 RA   Pt ambulated 726ft in hallway independently with steady gait. Pt able to maintain conversation throughout the walk. Pt with minimal SOB at end of walk, denies CP or dizziness. Pt states consistent low readings on his continuous glucose monitoring system (CGMS). When compared with readings from finger sticks, CGMS reading ~20 point lower. Encouraged pt to compare with home finger stick machine and talk with his endocrinologist. Reinforced importance of listening to his symptoms. Stressed importance of daily weights and watching for fluid weight. HF booklet given to pt during admission in December. Pt denies further questions or concerns at this time. Waiting on meds from [place]. Pt interested in attending CRP II, will re-refer to CRP II [place].   [id number] Rufina Falco, RN BSN 01/02/2021 8:43 AM

## 2021-01-02 NOTE — Discharge Instructions (Signed)
Heart Failure, Diagnosis  Heart failure means that your heart is not able to pump blood in the right way. This makes it hard for your body to work well. Heart failure is usually a long-term (chronic) condition. You must take good care of yourself and follow your treatment plan from your doctor. What are the causes?  High blood pressure.  Buildup of cholesterol and fat in the arteries.  Heart attack. This injures the heart muscle.  Heart valves that do not open and close properly.  Damage of the heart muscle. This is also called cardiomyopathy.  Infection of the heart muscle. This is also called myocarditis.  Lung disease. What increases the risk?  Getting older. The risk of heart failure goes up as a person ages.  Being overweight.  Being male.  Use tobacco or nicotine products.  Abusing alcohol or drugs.  Having taken medicines that can damage the heart.  Having any of these conditions: ? Diabetes. ? Abnormal heart rhythms. ? Thyroid problems. ? Low blood counts (anemia).  Having a family history of heart failure. What are the signs or symptoms?  Shortness of breath.  Coughing.  Swelling of the feet, ankles, legs, or belly.  Losing or gaining weight for no reason.  Trouble breathing.  Waking from sleep because of the need to sit up and get more air.  Fast heartbeat.  Being very tired.  Feeling dizzy, or feeling like you may pass out (faint).  Having no desire to eat.  Feeling like you may vomit (nauseous).  Peeing (urinating) more at night.  Feeling confused. How is this treated? This condition may be treated with:  Medicines. These can be given to treat blood pressure and to make the heart muscles stronger.  Changes in your daily life. These may include: ? Eating a healthy diet. ? Staying at a healthy body weight. ? Quitting tobacco, alcohol, and drug use. ? Doing exercises. ? Participating in a cardiac rehabilitation program. This program  helps you improve your health through exercise, education, and counseling.  Surgery. Surgery can be done to open blocked valves, or to put devices in the heart, such as pacemakers.  A donor heart (heart transplant). You will receive a healthy heart from a donor. Follow these instructions at home:  Treat other conditions as told by your doctor. These may include high blood pressure, diabetes, thyroid disease, or abnormal heart rhythms.  Learn as much as you can about heart failure.  Get support as you need it.  Keep all follow-up visits. Summary  Heart failure means that your heart is not able to pump blood in the right way.  This condition is often caused by high blood pressure, heart attack, or damage of the heart muscle.  Symptoms of this condition include shortness of breath and swelling of the feet, ankles, legs, or belly. You may also feel very tired or feel like you may vomit.  You may be treated with medicines, surgery, or changes in your daily life.  Treat other health conditions as told by your doctor. This information is not intended to replace advice given to you by your health care provider. Make sure you discuss any questions you have with your health care provider. Document Revised: 05/18/2020 Document Reviewed: 05/18/2020 Elsevier Patient Education  2021 [place].   Acute Respiratory Failure, Adult Acute respiratory failure is a condition that is a medical emergency. It can develop quickly, and it should be treated right away. There are two types of acute  respiratory failure:  Type I respiratory failure is when the lungs are not able to get enough oxygen into the blood. This causes the blood oxygen level to drop.  Type II respiratory failure is when carbon dioxide is not passing from the lungs out of the body. This causes carbon dioxide to build up in the blood. A person may have one type of acute respiratory failure or have both types at the same time. What  are the causes? Common causes of type I respiratory failure include:  Trauma to the lung, chest, ribs, or tissues around the lung.  Pneumonia.  Lung diseases, such as pulmonary fibrosis or asthma.  Smoke, chemical, or water inhalation.  A blood clot in the lungs (pulmonary embolism).  A blood infection (sepsis).  Heart attack. Common causes of type II respiratory failure include:  Stroke.  A spinal cord injury.  A drug or alcohol overdose.  A blood infection (sepsis).  Cardiac arrest. What increases the risk? This condition is more likely to develop in people who have:  Lung diseases such as asthma or chronic obstructive pulmonary disease (COPD).  A condition that damages or weakens the muscles, nerves, bones, or tissues that are involved in breathing, such as myasthenia gravis or Guillain-Barr syndrome.  A serious infection.  A health problem that blocks the unconscious reflex that is involved in breathing, such as hypothyroidism or sleep apnea. What are the signs or symptoms? Trouble breathing is the main symptom of acute respiratory failure. Symptoms may also include:  Fast breathing.  Restlessness or anxiety.  Breathing loudly (wheezing) and grunting.  Fast or irregular heartbeats (palpitations).  Confusion or changes in behavior.  Feeling tired (fatigue), sleeping more than normal, or being hard to wake.  Skin, lips, or fingernails that appear blue (cyanosis). How is this diagnosed? This condition may be diagnosed based on:  Your medical history and a physical exam. Your health care provider will listen to your heart and lungs to check for abnormal sounds.  Tests to confirm the diagnosis and determine the cause of respiratory failure. These tests may include: ? Measuring the amount of oxygen in your blood (pulse oximetry). The measurement comes from a small device that is placed on your finger, earlobe, or toe. ? Blood tests to measure blood oxygen  and carbon dioxide and to look for signs of infection. ? Tests on a sample of the fluid that surrounds the spinal cord (cerebrospinal fluid) or a sample of fluid that is drawn from the windpipe (trachea) to check for infections. ? Chest X-ray. ? Electrocardiogram (ECG) to look at the heart's electrical activity.   How is this treated? Treatment for this condition usually takes place in a hospital intensive care unit (ICU). Treatment depends on what is causing the condition. It may include one or more of these treatments:  Oxygen may be given through your nose or a face mask.  A device such as a continuous positive airway pressure (CPAP) machine or bi-level positive airway pressure (BPAP) machine may be used to help you breathe. The device gives you oxygen and pressure.  Breathing treatments, fluids, and other medicines may be given.  A ventilator may be used to help you breathe. The machine gives you oxygen and pressure. A tube is put into your mouth and trachea to connect the ventilator. ? If this treatment is needed longer term, a tracheostomy may be placed. A tracheostomy is a breathing tube put through your neck into your trachea.  In extreme cases,  extracorporeal life support (ECLS) may be used. This treatment temporarily takes over the function of the heart and lungs, supplying oxygen and removing carbon dioxide. ECLS gives the lungs a chance to recover. Follow these instructions at home: Medicines  Take over-the-counter and prescription medicines only as told by your health care provider.  If you were prescribed an antibiotic medicine, take it as told by your health care provider. Do not stop using the antibiotic even if you start to feel better.  If you are taking blood thinners: ? Talk with your health care provider before you take any medicines that contain aspirin or NSAIDs, such as ibuprofen. These medicines increase your risk for dangerous bleeding. ? Take your medicine exactly  as told, at the same time every day. ? Avoid activities that could cause injury or bruising, and follow instructions about how to prevent falls. ? Wear a medical alert bracelet or carry a card that lists what medicines you take. General instructions  Return to your normal activities as told by your health care provider. Ask your health care provider what activities are safe for you.  Do not use any products that contain nicotine or tobacco, such as cigarettes, e-cigarettes, and chewing tobacco. If you need help quitting, ask your health care provider.  Do not drink alcohol if: ? Your health care provider tells you not to drink. ? You are pregnant, may be pregnant, or are planning to become pregnant.  Wear compression stockings as told by your health care provider. These stockings help to prevent blood clots and reduce swelling in your legs.  Attend any physical therapy and pulmonary rehabilitation as told by your health care provider.  Keep all follow-up visits as told by your health care provider. This is important. How is this prevented?  If you have an infection or a medical condition that may lead to acute respiratory failure, make sure you get proper treatment. Contact a health care provider if:  You have a fever.  Your symptoms do not improve or they get worse. Get help right away if:  You are having trouble breathing.  You lose consciousness.  You develop a fast heart rate.  Your fingers, lips, or other areas turn blue.  You are confused. These symptoms may represent a serious problem that is an emergency. Do not wait to see if the symptoms will go away. Get medical help right away. Call your local emergency services (911 in the [place] Do not drive yourself to the hospital. Summary  Acute respiratory failure is a medical emergency. It can develop quickly, and it should be treated right away.  Treatment for this condition usually takes place in a hospital intensive  care unit (ICU). Treatment may include oxygen, fluids, and medicines. A device may be used to help you breathe, such as a ventilator.  Take over-the-counter and prescription medicines only as told by your health care provider.  Contact a health care provider if your symptoms do not improve or if they get worse. This information is not intended to replace advice given to you by your health care provider. Make sure you discuss any questions you have with your health care provider. Document Revised: 10/13/2019 Document Reviewed: 10/13/2019 Elsevier Patient Education  2021 [place].   Heart Failure, Self-Care Heart failure is a serious condition. The following information explains the things you need to do to take care of yourself after a heart failure diagnosis. You may be asked to change your diet, take certain medicines, and  make other lifestyle changes in order to stay as healthy as possible. Your health care provider may also give you more specific instructions. If you have problems or questions, contact your health care provider. What are the risks? Having heart failure puts you at higher risk for certain problems. These problems can get worse if you do not take good care of yourself. Problems may include:  Damage to the kidneys, liver, or lungs.  Malnutrition.  Abnormal heart rhythms.  Blood clotting issues that could cause a stroke. Supplies needed:  Scale for monitoring weight.  Blood pressure monitor.  Notebook.  Medicines. How to care for yourself when you have heart failure Medicines Take over-the-counter and prescription medicines only as told by your health care provider. Medicines reduce the workload of your heart, slow the progression of heart failure, and improve symptoms. Take your medicines every day.  Do not stop taking your medicine unless your health care provider tells you to do so.  Do not skip any dose of medicine.  Refill your prescriptions before you  run out of medicine.  Talk with your health care provider if you cannot afford your medicines. Eating and drinking  Eat heart-healthy foods. Talk with a dietitian to make an eating plan that is right for you. ? Limit salt (sodium) if told by your health care provider. Sodium restriction may reduce symptoms of heart failure. Ask a dietitian to recommend heart-healthy seasonings. ? Use healthy cooking methods instead of frying. Healthy methods include roasting, grilling, broiling, baking, poaching, steaming, and stir-frying. ? Choose foods that contain no trans fat and are low in saturated fat and cholesterol. Healthy choices include fresh or frozen fruits and vegetables, fish, lean meats, legumes, fat-free or low-fat dairy products, and whole-grain or high-fiber foods.  Limit your fluid intake, if directed by your health care provider. Fluid restriction may reduce symptoms of heart failure.   Alcohol use  Do not drink alcohol if: ? Your health care provider tells you not to drink. ? Your heart was damaged by alcohol, or you have severe heart failure. ? You are pregnant, may be pregnant, or are planning to become pregnant.  If you drink alcohol: ? Limit how much you have to:  0-1 drink a day for women.  0-2 drinks a day for men. ? Know how much alcohol is in your drink. In the [place] one drink equals one 12 oz bottle of beer (355 mL), one 5 oz glass of wine (148 mL), or one 1 oz glass of hard liquor (44 mL). Lifestyle  Do not use any products that contain nicotine or tobacco. These products include cigarettes, chewing tobacco, and vaping devices, such as e-cigarettes. If you need help quitting, ask your health care provider. ? Do not use nicotine gum or patches before talking to your health care provider.  Do not use illegal drugs.  Work with your health care provider to safely reach the right body weight.  Do physical activity if told by your health care provider. Talk to your health  care provider before you begin an exercise if: ? You are an older adult. ? You have severe heart failure.  Learn to manage stress. If you need help to do this, ask your health care provider.  Participate in or seek physical rehabilitation as needed to keep or improve your independence and quality of life.  Participate in a cardiac rehabilitation program, which is a treatment program to improve your health and well-being through exercise training, education, and  counseling.  Plan rest periods when you get tired.   Monitoring important information  Weigh yourself every day. This will help you to notice if too much fluid is building up in your body. ? Weigh yourself every morning after you urinate and before you eat breakfast. ? Wear the same amount of clothing each time you weigh yourself. ? Record your daily weight. Provide your health care provider with your weight record.  Monitor and record your pulse and blood pressure as told by your health care provider.   Dealing with extreme temperatures  If the weather is extremely hot: ? Avoid vigorous physical activity. ? Use air conditioning or fans, or find a cooler location. ? Avoid caffeine and alcohol. ? Wear loose-fitting, lightweight, and light-colored clothing.  If the weather is extremely cold: ? Avoid vigorous activity. ? Layer your clothes. ? Wear mittens or gloves, a hat, and a face covering when you go outside. ? Avoid alcohol. Follow these instructions at home:  Stay up to date with vaccines. Pneumococcal and flu (influenza) vaccines are especially important in preventing infections of the airways.  Keep all follow-up visits. This is important. Contact a health care provider if you:  Gain 2-3 lb (1-1.4 kg) in 24 hours or 5 lb (2.3 kg) in a week.  Have increasing shortness of breath.  Are unable to participate in your usual physical activities.  Get tired easily.  Cough more than normal, especially with physical  activity.  Lose your appetite or feel nauseous.  Have any swelling or more swelling in areas such as your hands, feet, ankles, or abdomen.  Are unable to sleep because it is hard to breathe.  Feel like your heart is beating quickly (palpitations).  Become dizzy or light-headed when you stand up.  Have feelings of depression or sadness. Get help right away if you:  Have trouble breathing.  Notice, or your family notices, a change in your awareness, such as having trouble staying awake or concentrating.  Have pain or discomfort in your chest.  Have an episode of fainting (syncope). These symptoms may represent a serious problem that is an emergency. Do not wait to see if the symptoms will go away. Get medical help right away. Call your local emergency services (911 in the [place] Do not drive yourself to the hospital. Summary  Heart failure is a serious condition. To care for yourself, you may be asked to change your diet, take certain medicines, and make other lifestyle changes.  Take your medicines every day. Do not stop taking them unless your health care provider tells you to do so.  Limit salt and eat heart-healthy foods, such as fresh or frozen fruits and vegetables, fish, lean meats, legumes, fat-free or low-fat dairy products, and whole-grain or high-fiber foods.  Ask your health care provider if you have any alcohol restrictions. You may have to stop drinking alcohol if you have severe heart failure.  Contact your health care provider if you notice problems, such as rapid weight gain or a fast heartbeat. Get help right away if you faint or have chest pain or trouble breathing. This information is not intended to replace advice given to you by your health care provider. Make sure you discuss any questions you have with your health care provider. Document Revised: 05/18/2020 Document Reviewed: 05/18/2020 Elsevier Patient Education  [place].   Heart Failure Action  Plan A heart failure action plan helps you understand what to do when you have symptoms of  heart failure. Your action plan is a color-coded plan that lists the symptoms to watch for and indicates what actions to take.  If you have symptoms in the red zone, you need medical care right away.  If you have symptoms in the yellow zone, you are having problems.  If you have symptoms in the green zone, you are doing well. Follow the plan that was created by you and your health care provider. Review your plan each time you visit your health care provider. Red zone These signs and symptoms mean you should get medical help right away:  You have trouble breathing when resting.  You have a dry cough that is getting worse.  You have swelling or pain in your legs or abdomen that is getting worse.  You suddenly gain more than 2-3 lb (0.9-1.4 kg) in 24 hours, or more than 5 lb (2.3 kg) in a week. This amount may be more or less depending on your condition.  You have trouble staying awake or you feel confused.  You have chest pain.  You do not have an appetite.  You pass out.  You have worsening sadness or depression. If you have any of these symptoms, call your local emergency services (911 in the [place] right away. Do not drive yourself to the hospital.   Yellow zone These signs and symptoms mean your condition may be getting worse and you should make some changes:  You have trouble breathing when you are active, or you need to sleep with your head raised on extra pillows to help you breathe.  You have swelling in your legs or abdomen.  You gain 2-3 lb (0.9-1.4 kg) in 24 hours, or 5 lb (2.3 kg) in a week. This amount may be more or less depending on your condition.  You get tired easily.  You have trouble sleeping.  You have a dry cough. If you have any of these symptoms:  Contact your health care provider within the next day.  Your health care provider may adjust your medicines.    Green zone These signs mean you are doing well and can continue what you are doing:  You do not have shortness of breath.  You have very little swelling or no new swelling.  Your weight is stable (no gain or loss).  You have a normal activity level.  You do not have chest pain or any other new symptoms.   Follow these instructions at home:  Take over-the-counter and prescription medicines only as told by your health care provider.  Weigh yourself daily. Your target weight is __________ lb (__________ kg). ? Call your health care provider if you gain more than __________ lb (__________ kg) in 24 hours, or more than __________ lb (__________ kg) in a week. ? Health care provider name: _____________________________________________________ ? Health care provider phone number: _____________________________________________________  Eat a heart-healthy diet. Work with a diet and nutrition specialist (dietitian) to create an eating plan that is best for you.  Keep all follow-up visits. This is important. Where to find more information  American Heart Association: www.heart.org Summary  A heart failure action plan helps you understand what to do when you have symptoms of heart failure.  Follow the action plan that was created by you and your health care provider.  Get help right away if you have any symptoms in the red zone. This information is not intended to replace advice given to you by your health care provider. Make sure you discuss  any questions you have with your health care provider. Document Revised: 06/10/2020 Document Reviewed: 06/10/2020 Elsevier Patient Education  2021 [employer].

## 2021-01-07 ENCOUNTER — Ambulatory Visit: Payer: Medicare Other | Admitting: Student

## 2021-01-08 NOTE — Progress Notes (Signed)
Primary Physician/Referring:  Rita Ohara, MD  Patient ID: Eduardo [name], male    DOB: 12-14-1955, 65 y.o.   MRN: [id number]  Chief Complaint  Patient presents with   Hypertension   3-vessel coronary artery disease   Follow-up   HPI:    Eduardo [name]  is a 65 y.o. Caucasian male  with controlled type 2 diabetes, hypertension, hyperlipidemia, coronary artery disease by angiography on 09/08/2016 revealing severe triple-vessel CAD with no targets for CABG, he presented with new onset left bundle branch block and acute fulminant pulmonary edema needing intubation on 10/11/2020, emergent cardiac catheterization essentially revealing progression of severe native vessel disease with no significant option for revascularization.  Although LAD was felt to be amenable for potential revascularization, it was unchanged from prior cardiac catheterization and RCA was felt to be the culprit, in view of diffuse disease medical management was recommended.  Past medical history is also significant for morbid obesity, diabetes mellitus with stage III AV chronic kidney disease, mild carotid atherosclerosis, right SFA occlusion S/P complex PV angiogram on 02/28/2019 with PTA to occluded right SFA with implantation of 3 overlapping 6.0 x 120 x2; 6.0 x 100 mm Eluvia DES for critical right leg ischemia now resolved. 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 He has mild disease in the left leg.  PAD has remained stable. Due to frequent PVCs, he was also started on amiodarone  prophylactically.   Patient was hospitalized 12/28/2020-01/02/2021 for acute on chronic systolic heart failure.  Patient was diuresed well, and given ischemic cardiomyopathy as etiology of heart failure attempted high risk PCI to LAD CTO, however it was unsuccessful. Conservative medical management was recommended, with consideration of palliative care involvement.  Patient reports compliance with torsemide, spironolactone, and  low-dose of carvedilol.  He is presently feeling well overall without significant shortness of breath.  He does continue to have mild dyspnea on exertion, however he has returned to his baseline activity including working around his home and preparing for an upcoming fishing trip without issue.  Denies chest pain, palpitations, orthopnea, PND.  Patient does continue to have mild discomfort at his groin access and reports bruising in the area.  Discharge Summary 01/02/2021 via Dr. Virgina Jock:  65 y.o.Caucasianmalewith hypertension, type 2 diabetes mellitus, multivessel coronary artery disease, ischemic cardiomyopathy with systolic heart failure EF 20-25%, PAD, admitted with acute on chronic systolic heart failure.  Over the next 5 days, patient with IV diuresis with Lasix, and was started on spironolactone with excellent diuresis.  His leg edema completely resolved her breathing significantly improved.  Given ischemic cardiomyopathy being the etiology, and patient not being a candidate for CABG due to no targets and obesity, we attempted high risk PCI to LAD CTO with Impella assistance. However, unfortunately we were unsuccessful.  There were no patient related complications.  He was discharged on addition of spironolactone, switch from lasix to torsemide 20 mg bid, reduction in carvedilol dose to 3.125 mg bid. On the day of discharge, patient ambulated without any significant chest pain or shortness of breath.  Of note, he is not a candidate for transplant.  He has refused that option.  His management remains conservative medical.  In future, could consider palliative care involvement.  Past Medical History:  Diagnosis Date   Chronic kidney disease    stage 3   Colon polyp    Coronary artery disease    Diabetes mellitus 1987   under care of Dr. Chalmers Cater.  On insulin since 96 (off and on)   Diabetic retinopathy    Dupuytren contracture    R hand, s/p injection (Dr. Lenon Curt)   Essential  hypertension, benign    Essential hypertension, benign 02/06/2019   Frequency of urination and polyuria    Hypertension    Myocardial infarction [facility])    denies   Neuromuscular disorder ([place])    Diabetic neuropathy   Osteomyelitis ([place])    right foot   Other testicular hypofunction    Peripheral arterial disease ([place]) 10/28/2012   Peritoneal abscess ([place]) 6/08   and buttock.   Pneumonia    Polydipsia    Proteinuria    Pure hyperglyceridemia    Subacute osteomyelitis, right ankle and foot ([place])    Wears glasses    Past Surgical History:  Procedure Laterality Date   ABDOMINAL AORTAGRAM N/A 04/18/2012   Procedure: ABDOMINAL Maxcine Ham;  Surgeon: Angelia Mould, MD;  Location: [facility] LAB;  Service: Cardiovascular;  Laterality: N/A;   AMPUTATION Right 05/19/2019   Procedure: RIGHT FOOT 5TH RAY AMPUTATION;  Surgeon: Newt Minion, MD;  Location: [place];  Service: Orthopedics;  Laterality: Right;   CARDIAC CATHETERIZATION N/A 09/08/2016   Procedure: Left Heart Cath and Coronary Angiography;  Surgeon: Adrian Prows, MD;  Location: [place] CV LAB;  Service: Cardiovascular;  Laterality: N/A;   CATARACT EXTRACTION, BILATERAL  09/2017, 10/2017   Dr. Herbert Deaner   COLONOSCOPY W/ BIOPSIES AND POLYPECTOMY     CORONARY/GRAFT ACUTE MI REVASCULARIZATION N/A 10/11/2020   Procedure: Coronary/Graft Acute MI Revascularization;  Surgeon: Adrian Prows, MD;  Location: [place] CV LAB;  Service: Cardiovascular;  Laterality: N/A;   LEFT HEART CATH N/A 12/31/2020   Procedure: Left Heart Cath;  Surgeon: Adrian Prows, MD;  Location: [place] CV LAB;  Service: Cardiovascular;  Laterality: N/A;   LEFT HEART CATH AND CORONARY ANGIOGRAPHY N/A 10/11/2020   Procedure: LEFT HEART CATH AND CORONARY ANGIOGRAPHY;  Surgeon: Adrian Prows, MD;  Location: [place] CV LAB;  Service: Cardiovascular;  Laterality: N/A;   LOWER EXTREMITY ANGIOGRAM Bilateral 04/18/2012   Procedure: LOWER EXTREMITY  ANGIOGRAM;  Surgeon: Angelia Mould, MD;  Location: [facility] CATH LAB;  Service: Cardiovascular;  Laterality: Bilateral;  bilat lower extrem angio   LOWER EXTREMITY ANGIOGRAPHY Bilateral 05/02/2019   Procedure: LOWER EXTREMITY ANGIOGRAPHY;  Surgeon: Adrian Prows, MD;  Location: [place] CV LAB;  Service: Cardiovascular;  Laterality: Bilateral;   LOWER EXTREMITY ANGIOGRAPHY Bilateral 02/28/2019   Procedure: LOWER EXTREMITY ANGIOGRAPHY;  Surgeon: Adrian Prows, MD;  Location: [place] CV LAB;  Service: Cardiovascular;  Laterality: Bilateral;   macular photocoagulation     (eye treatments for diabetic retinopathy)-Dr. Zigmund Daniel   PERIPHERAL VASCULAR INTERVENTION  02/28/2019   Procedure: PERIPHERAL VASCULAR INTERVENTION;  Surgeon: Adrian Prows, MD;  Location: [place] CV LAB;  Service: Cardiovascular;;   VENTRICULAR ASSIST DEVICE INSERTION N/A 12/31/2020   Procedure: VENTRICULAR ASSIST DEVICE INSERTION;  Surgeon: Adrian Prows, MD;  Location: [place] CV LAB;  Service: Cardiovascular;  Laterality: N/A;    Family History  Problem Relation Age of Onset   Diabetes Mother    Hearing loss Mother    Hypertension Mother    Hyperlipidemia Mother    Heart disease Mother    Varicose Veins Mother    Varicose Veins Father    Dementia Father    Hyperlipidemia Brother    Diabetes Maternal Grandmother    Social History   Tobacco Use   Smoking status: Former Smoker  Packs/day: 1.00    Years: 30.00    Pack years: 30.00    Types: Cigarettes    Quit date: 01/08/2012    Years since quitting: 9.0   Smokeless tobacco: Never Used  Substance Use Topics   Alcohol use: Yes    Comment: rare   Marital Status: Widowed ROS  Review of Systems  Constitutional: Negative for malaise/fatigue and weight gain.  Cardiovascular: Positive for dyspnea on exertion (stable). Negative for chest pain, claudication, leg swelling, near-syncope, orthopnea, palpitations, paroxysmal nocturnal dyspnea and  syncope.  Respiratory: Negative for shortness of breath.   Hematologic/Lymphatic: Does not bruise/bleed easily.  Gastrointestinal: Negative for melena.  Neurological: Negative for dizziness and weakness.  All other systems reviewed and are negative.  Objective  Blood pressure 111/61, pulse (!) 57, temperature (!) 96.8 F (36 C), temperature source Temporal, resp. rate 16, height 5' 10"  (1.778 m), weight 287 lb 3.2 oz (130.3 kg), SpO2 97 %.  Vitals with BMI 01/09/2021 01/02/2021 01/02/2021  Height 5' 10"  - -  Weight 287 lbs 3 oz 294 lbs 5 oz -  BMI 46.96 29.52 -  Systolic 841 - 324  Diastolic 61 - 60  Pulse 57 - -     Physical Exam Constitutional:      Appearance: He is well-developed.     Comments: Morbidly obese, in no acute distress  HENT:     Head: Atraumatic.  Neck:     Thyroid: No thyromegaly.     Comments: Short neck and difficult to evaluate JVP Cardiovascular:     Pulses:          Carotid pulses are 2+ on the right side and 2+ on the left side.      Popliteal pulses are 0 on the right side and 2+ on the left side.       Dorsalis pedis pulses are 1+ on the right side and 2+ on the left side.       Posterior tibial pulses are 0 on the right side and 1+ on the left side.     Heart sounds: No murmur heard. No gallop.      Comments: Femoral and popliteal pulse difficult to feel due to patient's body habitus.  Femoral access site healing well without drainage, erythema.  Surrounding ecchymosis as well as hematoma.  No fluctuance noted.  No bruit.  Pulmonary:     Effort: Pulmonary effort is normal.  Abdominal:     Palpations: Abdomen is soft.     Comments: Obese. Pannus present  Musculoskeletal:        General: Normal range of motion.     Cervical back: Neck supple.  Skin:    General: Skin is dry.     Comments: Ecchymosis of the lower abdomen, suprapubic region, and scrotum.  Neurological:     Mental Status: He is alert.    Laboratory examination:   Recent Labs     11/28/20 0930 12/05/20 1016 12/19/20 1031 12/28/20 1108 12/31/20 0318 01/01/21 0045 01/02/21 0038  NA 147* 144 144   < > 142 136 139  K 4.9 4.8 4.8   < > 4.9 5.0 3.9  CL 106 103 105   < > 106 105 105  CO2 24 23 22    < > 28 20* 24  GLUCOSE 84 113* 159*   < > 68* 182* 46*  BUN 35* 38* 47*   < > 32* 25* 26*  CREATININE 1.55* 1.63* 1.82*   < > 1.75* 1.50*  1.47*  CALCIUM 9.3 8.8 9.3   < > 8.8* 8.5* 8.9  GFRNONAA 46* 44* 38*   < > 43* 51* 53*  GFRAA 54* 50* 44*  --   --   --   --    < > = values in this interval not displayed.   estimated creatinine clearance is 68 mL/min (A) (by C-G formula based on SCr of 1.47 mg/dL (H)).  CMP Latest Ref Rng & Units 01/02/2021 01/01/2021 12/31/2020  Glucose 70 - 99 mg/dL 46(L) 182(H) 68(L)  BUN 8 - 23 mg/dL 26(H) 25(H) 32(H)  Creatinine 0.61 - 1.24 mg/dL 1.47(H) 1.50(H) 1.75(H)  Sodium 135 - 145 mmol/L 139 136 142  Potassium 3.5 - 5.1 mmol/L 3.9 5.0 4.9  Chloride 98 - 111 mmol/L 105 105 106  CO2 22 - 32 mmol/L 24 20(L) 28  Calcium 8.9 - 10.3 mg/dL 8.9 8.5(L) 8.8(L)  Total Protein 6.5 - 8.1 g/dL - - -  Total Bilirubin 0.3 - 1.2 mg/dL - - -  Alkaline Phos 38 - 126 U/L - - -  AST 15 - 41 U/L - - -  ALT 0 - 44 U/L - - -   CBC Latest Ref Rng & Units 01/01/2021 12/28/2020 10/22/2020  WBC 4.0 - 10.5 K/uL 8.0 9.9 7.1  Hemoglobin 13.0 - 17.0 g/dL 14.9 17.2(H) 15.6  Hematocrit 39.0 - 52.0 % 45.4 52.9(H) 46.2  Platelets 150 - 400 K/uL 136(L) 186 259   Lipid Panel     Component Value Date/Time   CHOL 137 06/24/2020 0937   TRIG 144 10/12/2020 0511   HDL 33 (L) 06/24/2020 0937   CHOLHDL 4.2 06/24/2020 0937   CHOLHDL 4.4 09/28/2011 1358   VLDL 21 09/28/2011 1358   LDLCALC 72 06/24/2020 0937    HEMOGLOBIN A1C Lab Results  Component Value Date   HGBA1C 6.7 11/19/2020   MPG 182.9 10/11/2020   TSH Recent Labs    06/24/20 0937  TSH 1.650   External Labs:  Glucose Random 453.000 M 09/08/2019 BUN 38.000 M 09/08/2019 Creatinine, Serum 1.340 MG/  09/08/2019  PSA 3.300 06/12/2019 01/19/2018: Cholesterol 115, triglycerides 112, HDL 39, LDL 54.  Glucose 125, creatinine 1.16, potassium 5.2, EGFR 67, CMP otherwise normal.  CBC normal.  TSH 1.5.  Medications and allergies   Allergies  Allergen Reactions   Shellfish-Derived Products Nausea And Vomiting and Other (See Comments)    Mussels cause severe nausea and vomiting    Latex Rash   Tape Rash and Other (See Comments)    Caused issues with the skin   Testosterone Rash    Current Outpatient Medications on File Prior to Visit  Medication Sig Dispense Refill   acetaminophen (TYLENOL) 325 MG tablet Take 650 mg by mouth every 6 (six) hours as needed for mild pain (or headaches).     aspirin 81 MG chewable tablet Chew 1 tablet (81 mg total) by mouth daily.     B-D UF III MINI PEN NEEDLES 31G X 5 MM MISC 6 (six) times daily.  0   Bioflavonoid Products (ESTER C PO) Take 1,000 mg by mouth daily.     carvedilol (COREG) 6.25 MG tablet Take 0.5 tablets (3.125 mg total) by mouth 2 (two) times daily with a meal. 1 tablet 0   Cholecalciferol (VITAMIN D) 2000 units CAPS Take 2,000 Units by mouth daily.     clopidogrel (PLAVIX) 75 MG tablet Take 1 tablet (75 mg total) by mouth daily. 90 tablet 1   Continuous Blood  Gluc Sensor (FREESTYLE LIBRE 14 DAY SENSOR) MISC every 14 (fourteen) days.     empagliflozin (JARDIANCE) 10 MG TABS tablet Take 1 tablet (10 mg total) by mouth daily. 90 tablet 3   gemfibrozil (LOPID) 600 MG tablet Take 600 mg by mouth 2 (two) times daily before a meal.     HUMALOG KWIKPEN 100 UNIT/ML KiwkPen Inject 0-10 Units into the skin See admin instructions. Inject 0-10 units into the skin three times a day, per sliding scale- based on BGL >100  1   HUMULIN N KWIKPEN 100 UNIT/ML Kiwkpen Inject 30 Units into the skin See admin instructions. Inject 30 units into the skin before breakfast and supper  1   isosorbide mononitrate (IMDUR) 60 MG 24 hr tablet Take 1 tablet (60  mg total) by mouth daily. 90 tablet 1   Multiple Vitamin (MULTIVITAMIN WITH MINERALS) TABS tablet Take 1 tablet by mouth daily.     nitroGLYCERIN (NITROSTAT) 0.4 MG SL tablet Place 0.4 mg under the tongue every 5 (five) minutes as needed for chest pain.  1   Omega-3 Fatty Acids (FISH OIL PO) Take 350 mg by mouth daily with breakfast.     Potassium Chloride ER 20 MEQ TBCR Take 20 mEq by mouth daily. 30 tablet 3   Probiotic Product (PROBIOTIC-10 PO) Take 1 capsule by mouth daily.     rosuvastatin (CRESTOR) 20 MG tablet Take 1 tablet (20 mg total) by mouth daily. 90 tablet 3   sacubitril-valsartan (ENTRESTO) 24-26 MG Take 1 tablet by mouth 2 (two) times daily. 60 tablet 1   spironolactone (ALDACTONE) 25 MG tablet Take 1 tablet (25 mg total) by mouth daily. 30 tablet 1   torsemide (DEMADEX) 20 MG tablet Take 1 tablet (20 mg total) by mouth 2 (two) times daily at 10 am and 4 pm. 60 tablet 1   No current facility-administered medications on file prior to visit.    Medications Discontinued During This Encounter  Medication Reason   doxycycline (VIBRA-TABS) 100 MG tablet Error    Radiology:   DG CHEST PORT 1 VIEW 10/12/2020: COMPARISON: 10/11/2020 FINDINGS: Vascular congestion with bilateral lower lung zone airspace opacities are without change. Upper lungs are clear. Suspect small effusions. No pneumothorax. Endotracheal tube, right internal jugular central venous line and nasal/orogastric tube are stable. IMPRESSION: 1. No change from the previous day's exam. 2. Persistent vascular congestion and lower lung zone airspace opacities, the latter finding likely due to a combination of pleural fluid with atelectasis, with a possible component of either edema or infection.   Cardiac Studies:     Exercise sestamibi stress test 08/17/2016: 1. Resting EKG demonstrates normal sinus rhythm, left axis deviation, left plantar fascicular block.  Poor R-wave progression, ulnar disease pattern.   Nonspecific ST-T abnormality, cannot exclude lateral ischemia.  Stress EKG is equivocal for ischemia, patient developing atypical left otherwise for with exercise which reverted back to baseline immediately on combination of the stress test less than 60 seconds.  There was no additional ST-T wave changes of ischemia. Patient exercised on Bruce protocol for 4:25 minutes and achieved 5.45 METS. Stress test terminated due to 89 % MPHR achieved (Target HR >85%). Symptoms included dizziness.  2.  2-Day protocol followed. Perfusion imaging studies demonstrate large sized severe perfusion defect involving the inferior, anterior and anterolateral consistent with inferior wall scar with moderate peri-infarct ischemia and severe anterior and anteroapical reversible ischemia extending from the base towards the apex.  Left ventricular systolic function calculating by QGS  was markedly depressed at 26%.  This is a high risk study, consider further cardiac work-up.   Carotid artery duplex 02/16/2018: No hemodynamically significant arterial disease in the internal carotid artery bilaterally. Mild heterogenous plaque noted.  Antegrade right vertebral artery flow. Antegrade left vertebral artery flow. Compared to the study done on 09/01/2016, left carotid noted as occlusion is an error. This may perhaps be due to low velocity noted in both carotid arteries and may indicate low systemic BP or reduced cardiac output. Clinical correlation recommended.  Peripheral arteriogram 02/28/2019: Pelvic aortogram with limited bifemoral arteriogram revealed patent distal abdominal aorta without aneurysm, patent, bilateral iliac and common femoral vessels. Right SFA is occluded in the ostium and reconstitutes outside of the Hunter's canal.  Below the right knee there is two-vessel runoff, AT is occluded.  Left leg was not studied beyond left common femoral artery. Successful PTA and stenting of right SFA, prolonged procedure, difficult  procedure with use of multiple balloons, guidewires and catheters.  100% stenosis reduced to 0% with implantation of 3 overlapping 6.0 x 120 x2; 6.0 x 100 mm Eluvia DES. 180 mL contrast utilized.  Peripheral arteriogram 05/02/2019: Right common femoral artery and right proximal SFA previously placed stent widely patent.  Right iliac artery shows mild disease. Left iliac artery and left femoral arteries show very mild disease.  There is two-vessel runoff in the left leg, left AT is occluded. Intermediate stenosis noted in the left distal and proximal SFA, pressure pullback reveals no significant gradient.   ABI 08/09/2019: This exam reveals normal perfusion of the right lower extremity (ABI). This exam reveals normal perfusion of the left lower extremity (ABI).  The ABI may be falsely elevated due to medial calcinosis from diabetes. No significant change since 05/11/2019. Patient has h/o right SFA stenting.   Left Heart Catheterization 10/11/20: LV: Severely dilated. Global hypokinesis. Hand contrast injection hence not fully adequately visualized. LVEF 15 to 20%. EDP markedly elevated at 29 mmHg. No pressure gradient across the aortic valve. Left main: Normal. LAD: Severely diffusely diseased. Gives origin to large D1 which gives collaterals to the LAD and 2 smaller diagonals, LAD is occluded after the origin of D1, anatomy compared to prior in 2017 reveals progression of diffuse disease. There are ipsilateral and contralateral collaterals to the LAD. CX: Moderate sized vessel, giving origin to large OM1. OM1 is occluded in the ostium as was noted previously and has ipsilateral collaterals. OM1 is diffusely diseased and faintly filled. RCA: Moderate disease in the proximal and mid segment. At the bifurcation of PDA and PL, there is a high-grade 90% stenosis. The bifurcation is also involved with at least a 90% stenosis in the PDA and a 60 to 70% stenosis in the PL branch. There is no  target as distally as the PL branch which is large is occluded distally and that was new from 2017. There are faint collaterals noted from the left to the RCA.  Impression: Severe native vessel three-vessel coronary artery disease with no significant targets for revascularization, LAD may be amenable for revascularization but I am not sure that this would help, anatomy similar to 2017 but progression to diffuse disease. Patient is extremely ill with high risk for mortality. He also has underlying stage III-IV kidney disease and contrast nephropathy needs to be evaluated further in view of contrast load of 70 mL.  Echocardiogram 12/29/2020: 1. Left ventricular ejection fraction, by estimation, is <20%. The left ventricle has severely decreased function. The left ventricle demonstrates  regional wall motion abnormalities (see scoring diagram/findings for description). The left ventricular internal cavity size was moderately dilated. There is mild left ventricular hypertrophy. Left ventricular diastolic parameters are consistent with Grade II diastolic dysfunction (pseudonormalization). Elevated left atrial pressure. Akinetic inferolateral and apical myocardium. Moderate anteroseptal hypokinesis.  2. Right ventricular systolic function is low normal. The right ventricular size is normal.  3. The mitral valve is grossly normal. Mild to moderate mitral valve regurgitation.  4. The aortic valve is tricuspid. Aortic valve regurgitation is not visualized.  5. The inferior vena cava is dilated in size with <50% respiratory variability, suggesting right atrial pressure of 15 mmHg.  6. Compared to previous study in 10/2020, LVEF has further decreased from 20-25% to 15-20%.  Coronary Angioplasty 12/31/20 Impella support high risk intervention  Unsuccessful attempt at proximal LAD CTO intervention in spite of use of support catheters and multiple wires. 129 ml contrast used. No immediate  complications.  Continue guideline directed medical therapy for heart failure discuss regarding any future attempts of revascularization.   EKG   EKG 08/11/2020: Sinus tachycardia at rate of 120 bpm, atypical left bundle branch block. No further analysis.  EKG 09/13/2020: Normal sinus rhythm at rate of 91 bpm, left atrial enlargement, inferior infarct old. Anteroseptal infarct old. Nonspecific T abnormality. Compared to 08/11/2020, left bundle branch block not present.  EKG 04/19/2020: Normal sinus rhythm at rate of 61 bpm, inferior infarct old.  Anteroseptal infarct old.  Nonspecific T abnormality.  Borderline low voltage complexes.  No significant change from 11/08/2019.   Assessment     ICD-10-CM   1. Ischemic cardiomyopathy  I25.5   2. Chronic systolic heart failure (HCC)  I50.22   3. 3-vessel coronary artery disease  I25.10   4. Hyperkalemia, diminished renal excretion  T24.5 Basic metabolic panel  5. Stage 3b chronic kidney disease (HCC)  N18.32    No orders of the defined types were placed in this encounter.  Medications Discontinued During This Encounter  Medication Reason   doxycycline (VIBRA-TABS) 100 MG tablet Error       Recommendations:    Eduardo [name]  is a 64 y.o. Caucasian male  with controlled type 2 diabetes, hypertension, hyperlipidemia, coronary artery disease by angiography on 09/08/2016 revealing severe triple-vessel CAD with no targets for CABG, he presented with new onset left bundle branch block and acute fulminant pulmonary edema needing intubation on 10/11/2020, emergent cardiac catheterization essentially revealing progression of severe native vessel disease with no significant option for revascularization.  Although LAD was felt to be amenable for potential revascularization, it was unchanged from prior cardiac catheterization and RCA was felt to be the culprit, in view of diffuse disease medical management was recommended.  Past medical history is  also significant for morbid obesity, diabetes mellitus with stage III AV chronic kidney disease, mild carotid atherosclerosis, right SFA occlusion S/P complex PV angiogram on 02/28/2019 with PTA to occluded right SFA with implantation of 3 overlapping 6.0 x 120 x2; 6.0 x 100 mm Eluvia DES for critical right leg ischemia now resolved. 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 He has mild disease in the left leg.  PAD has remained stable. Due to frequent PVCs, he was also started on amiodarone prophylactically.   Patient was again hospitalized 12/28/2020-01/02/2021 for acute on chronic systolic heart failure he diuresed well and underwent unsuccessful attempt to proximal LAD CTO intervention, was again recommended continued medical therapy.  Today patient is presently feeling well,  tolerating guideline directed medical therapy.  There are no clinical signs of acute decompensated heart failure at this time.  Patient does have history of hyperkalemia, therefore will obtain repeat BMP as patient is now on torsemide, potassium supplements, and spironolactone.  Will not make changes to patient's cardiovascular medications at this time.  Discussed with patient regarding palliative care consult and goals of care.  At this time patient prefers to hold off on palliative care consult as he is tolerating guideline directed medical therapy and is presently feeling well.  Would like to reevaluate the need for palliative care consult in the future.  Advised patient to notify our office if he experiences worsening pain, bleeding, or drainage from right femoral access site.  Verbalized understanding agreement.  Follow-up in 4 weeks, sooner if needed, for chronic systolic heart failure, ischemic cardiomyopathy.  She has appointment with Dr. Haroldine Laws in 2 weeks.  Patient was seen in collaboration with Dr. Einar Gip. He also reviewed patient's chart and Dr. Einar Gip is in agreement of the plan.     Alethia Berthold, PA-C 01/09/2021, 2:43 PM Office: [phone]   CC: Madelon Lips, MD ([place])

## 2021-01-09 ENCOUNTER — Other Ambulatory Visit: Payer: Self-pay

## 2021-01-09 ENCOUNTER — Ambulatory Visit: Payer: Medicare Other | Admitting: Student

## 2021-01-09 ENCOUNTER — Encounter: Payer: Self-pay | Admitting: Student

## 2021-01-09 VITALS — BP 111/61 | HR 57 | Temp 96.8°F | Resp 16 | Ht 70.0 in | Wt 287.2 lb

## 2021-01-09 DIAGNOSIS — I2510 Atherosclerotic heart disease of native coronary artery without angina pectoris: Secondary | ICD-10-CM

## 2021-01-09 DIAGNOSIS — E875 Hyperkalemia: Secondary | ICD-10-CM

## 2021-01-09 DIAGNOSIS — I5022 Chronic systolic (congestive) heart failure: Secondary | ICD-10-CM

## 2021-01-09 DIAGNOSIS — N1832 Chronic kidney disease, stage 3b: Secondary | ICD-10-CM

## 2021-01-09 DIAGNOSIS — I255 Ischemic cardiomyopathy: Secondary | ICD-10-CM

## 2021-01-10 ENCOUNTER — Telehealth (HOSPITAL_COMMUNITY): Payer: Self-pay

## 2021-01-10 ENCOUNTER — Telehealth: Payer: Self-pay

## 2021-01-10 NOTE — Telephone Encounter (Signed)
Called patient to see if he is interested in the Cardiac Rehab Program. Patient expressed interest. Explained scheduling process and went over insurance, patient verbalized understanding. Will contact patient for scheduling once f/u has been completed.  °

## 2021-01-10 NOTE — Telephone Encounter (Signed)
Pt insurance is active and benefits verified through [facility]. Co-pay $0.00, DED $0.00/$0.00 met, out of pocket $3,900.00/$445.00 met, co-insurance 0%. No pre-authorization required. Passport, 01/10/21 @ 3:20PM, GJG#[id number]  Will contact patient to see if he is interested in the Cardiac Rehab Program. If interested, patient will need to complete follow up appt. Once completed, patient will be contacted for scheduling upon review by the RN Navigator.

## 2021-01-10 NOTE — Telephone Encounter (Signed)
Eduardo [name] with [facility] called to report an abnormal finding she did a PAD test and the lt. Leg was 0.52 and the rt. Leg was 0.72, she said pt. Is aware of this and his cardiologist is aware also.

## 2021-01-15 ENCOUNTER — Other Ambulatory Visit: Payer: Self-pay | Admitting: Student

## 2021-01-15 DIAGNOSIS — I5022 Chronic systolic (congestive) heart failure: Secondary | ICD-10-CM

## 2021-01-15 DIAGNOSIS — E875 Hyperkalemia: Secondary | ICD-10-CM

## 2021-01-15 LAB — BASIC METABOLIC PANEL
BUN/Creatinine Ratio: 29 — ABNORMAL HIGH (ref 10–24)
BUN: 63 mg/dL — ABNORMAL HIGH (ref 8–27)
CO2: 16 mmol/L — ABNORMAL LOW (ref 20–29)
Calcium: 8.8 mg/dL (ref 8.6–10.2)
Chloride: 103 mmol/L (ref 96–106)
Creatinine, Ser: 2.21 mg/dL — ABNORMAL HIGH (ref 0.76–1.27)
Glucose: 111 mg/dL — ABNORMAL HIGH (ref 65–99)
Potassium: 5.2 mmol/L (ref 3.5–5.2)
Sodium: 143 mmol/L (ref 134–144)
eGFR: 32 mL/min/{1.73_m2} — ABNORMAL LOW (ref >59)

## 2021-01-15 NOTE — Progress Notes (Signed)
Called patient and advised him to hold spironolactone in view of worsening renal function, will repeat BMP in 2 weeks prior to patient's follow-up visit.  Patient verbalized understanding and agreement.

## 2021-01-22 ENCOUNTER — Ambulatory Visit (HOSPITAL_COMMUNITY)
Admission: RE | Admit: 2021-01-22 | Discharge: 2021-01-22 | Disposition: A | Discharge status: Home / Self Care | Payer: Medicare Other | Source: Ambulatory Visit | Attending: Internal Medicine | Admitting: Internal Medicine

## 2021-01-22 ENCOUNTER — Other Ambulatory Visit: Payer: Self-pay

## 2021-01-22 ENCOUNTER — Encounter (HOSPITAL_COMMUNITY): Payer: Self-pay | Admitting: Internal Medicine

## 2021-01-22 VITALS — BP 110/70 | HR 79 | Wt 286.4 lb

## 2021-01-22 DIAGNOSIS — I5022 Chronic systolic (congestive) heart failure: Secondary | ICD-10-CM | POA: Diagnosis not present

## 2021-01-22 DIAGNOSIS — Z7982 Long term (current) use of aspirin: Secondary | ICD-10-CM | POA: Insufficient documentation

## 2021-01-22 DIAGNOSIS — Z794 Long term (current) use of insulin: Secondary | ICD-10-CM | POA: Insufficient documentation

## 2021-01-22 DIAGNOSIS — Z89421 Acquired absence of other right toe(s): Secondary | ICD-10-CM | POA: Diagnosis not present

## 2021-01-22 DIAGNOSIS — N1832 Chronic kidney disease, stage 3b: Secondary | ICD-10-CM

## 2021-01-22 DIAGNOSIS — I447 Left bundle-branch block, unspecified: Secondary | ICD-10-CM | POA: Diagnosis not present

## 2021-01-22 DIAGNOSIS — Z833 Family history of diabetes mellitus: Secondary | ICD-10-CM | POA: Insufficient documentation

## 2021-01-22 DIAGNOSIS — I2510 Atherosclerotic heart disease of native coronary artery without angina pectoris: Secondary | ICD-10-CM | POA: Diagnosis not present

## 2021-01-22 DIAGNOSIS — E1122 Type 2 diabetes mellitus with diabetic chronic kidney disease: Secondary | ICD-10-CM | POA: Diagnosis not present

## 2021-01-22 DIAGNOSIS — Z8349 Family history of other endocrine, nutritional and metabolic diseases: Secondary | ICD-10-CM | POA: Insufficient documentation

## 2021-01-22 DIAGNOSIS — Z79899 Other long term (current) drug therapy: Secondary | ICD-10-CM | POA: Insufficient documentation

## 2021-01-22 DIAGNOSIS — N179 Acute kidney failure, unspecified: Secondary | ICD-10-CM | POA: Insufficient documentation

## 2021-01-22 DIAGNOSIS — N1831 Chronic kidney disease, stage 3a: Secondary | ICD-10-CM | POA: Diagnosis not present

## 2021-01-22 DIAGNOSIS — Z87891 Personal history of nicotine dependence: Secondary | ICD-10-CM | POA: Diagnosis not present

## 2021-01-22 DIAGNOSIS — Z8249 Family history of ischemic heart disease and other diseases of the circulatory system: Secondary | ICD-10-CM | POA: Diagnosis not present

## 2021-01-22 DIAGNOSIS — I252 Old myocardial infarction: Secondary | ICD-10-CM | POA: Diagnosis not present

## 2021-01-22 DIAGNOSIS — I130 Hypertensive heart and chronic kidney disease with heart failure and stage 1 through stage 4 chronic kidney disease, or unspecified chronic kidney disease: Secondary | ICD-10-CM | POA: Insufficient documentation

## 2021-01-22 DIAGNOSIS — E785 Hyperlipidemia, unspecified: Secondary | ICD-10-CM | POA: Diagnosis not present

## 2021-01-22 DIAGNOSIS — E1165 Type 2 diabetes mellitus with hyperglycemia: Secondary | ICD-10-CM | POA: Insufficient documentation

## 2021-01-22 DIAGNOSIS — I5023 Acute on chronic systolic (congestive) heart failure: Secondary | ICD-10-CM | POA: Diagnosis present

## 2021-01-22 DIAGNOSIS — E1151 Type 2 diabetes mellitus with diabetic peripheral angiopathy without gangrene: Secondary | ICD-10-CM | POA: Diagnosis not present

## 2021-01-22 HISTORY — DX: Heart failure, unspecified: I50.9

## 2021-01-22 NOTE — Progress Notes (Signed)
ADVANCED HF CLINIC NOTE  PCP: .Rita Ohara, MD Primary Cardiologist: Dr. Adrian Prows  HPI:  Eduardo [name] is 65 y.o. 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  Presented to [facility] ED on 12/3 from home via EMS per records after waking up at 2AM with acute hypoxic respiratory failure due to pulmonary edema. Intubated in ER. He was found to have new left bundle branch block with pulmonary edema and code STEMI called. He was taken to cath lab found to have global hypokinesis, EF 15-20%, LVEDP 29 and severe native three-vessel coronary artery  disease (LAD 100%p LCX all marginals occluded< RCA diffuse distal 99%) - with no obvious culprit lesion and no targets for revascularization. Hstrop peaked at 27k,. Admitted to CCU for management of cardiogenic shock. Started on [place].  Echo EF 20-25% RV normal. He stabilized relatively quickly and was discharged home.   Seen in [facility] 1/22 and decided against VAD.  Readmitted 12/28/20 with ADHF. EF 15%. Went for cath with Dr. Virgina Jock and Dr. Einar Gip. Cath with 100% LAD. OM-1 100% Distal RCA 99% Attempted CTO of LAD with Impella support but unsuccessful. Says he is doing pretty well. No CP, orthopnea or PND. Can do ADLs without problem. Planning on going to [facility] to fish soon. On torsemide 20 bid. Creatinine bumped last week from 1.5 -> 2.2. Arlyce Harman stopped. Has f/u with Dr. Hollie Salk tomorrow.    ROS: All systems negative except as listed in HPI, PMH and Problem List.  SH:  Social History   Socioeconomic History  . Marital status: Widowed    Spouse name: Not on file  . Number of children: 0  . Years of education: Not on file  . Highest education level: Not on file  Occupational History  . 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 Smoking status: Former Smoker     Packs/day: 1.00    Years: 30.00    Pack years: 30.00    Types: Cigarettes    Quit date: 01/08/2012    Years since quitting: 9.0  . Smokeless tobacco: Never Used  Vaping Use  . Vaping Use: Never used  Substance and Sexual Activity  . Alcohol use: Yes    Comment: rare  . Drug use: No  . Sexual activity: Yes    Partners: Female    Birth control/protection: Condom  Other Topics Concern  . Not on file  Social History Narrative   Widowed.    Girlfriends stays with him on weekends   Previously traveled 40 weeks out of the year. No longer travels at all (since COVID none, cut back on travel in 2019). No pets   Working from home.   Social Determinants of Health   Financial Resource Strain: Not on file  Food Insecurity: Not on file  Transportation Needs: Not on file  Physical Activity: Not on file  Stress: Not on file  Social Connections: Not on file  Intimate Partner Violence: Not on file    FH:  Family History  Problem Relation Age of Onset  . Diabetes Mother   . Hearing loss Mother   . Hypertension Mother   . Hyperlipidemia Mother   . Heart disease Mother   . Varicose Veins Mother   . Varicose Veins Father   .  Dementia Father   . Hyperlipidemia Brother   . Diabetes Maternal Grandmother     Past Medical History:  Diagnosis Date  . CHF (congestive heart failure) ([place])   . Chronic kidney disease    stage 3  . Colon polyp   . Coronary artery disease   . Diabetes mellitus 1987   under care of Dr. Chalmers Cater.  On insulin since 96 (off and on)  . Diabetic retinopathy   . Dupuytren contracture    R hand, s/p injection (Dr. Lenon Curt)  . Essential hypertension, benign   . Essential hypertension, benign 02/06/2019  . Frequency of urination and polyuria   . Hypertension   . Myocardial infarction [facility])    denies  . Neuromuscular disorder ([place])    Diabetic neuropathy  . Osteomyelitis ([place])    right foot  . Other testicular hypofunction   . Peripheral arterial disease  ([place]) 10/28/2012  . Peritoneal abscess ([place]) 6/08   and buttock.  . Pneumonia   . Polydipsia   . Proteinuria   . Pure hyperglyceridemia   . Subacute osteomyelitis, right ankle and foot ([place])   . Wears glasses     Current Outpatient Medications  Medication Sig Dispense Refill  . acetaminophen (TYLENOL) 325 MG tablet Take 650 mg by mouth every 6 (six) hours as needed for mild pain (or headaches).    Marland Kitchen aspirin 81 MG chewable tablet Chew 1 tablet (81 mg total) by mouth daily.    . B-D UF III MINI PEN NEEDLES 31G X 5 MM MISC 6 (six) times daily.  0  . Bioflavonoid Products (ESTER C PO) Take 1,000 mg by mouth daily.    . carvedilol (COREG) 3.125 MG tablet Take 3.125 mg by mouth 2 (two) times daily with a meal.    . Cholecalciferol (VITAMIN D) 2000 units CAPS Take 2,000 Units by mouth daily.    . clopidogrel (PLAVIX) 75 MG tablet Take 1 tablet (75 mg total) by mouth daily. 90 tablet 1  . Continuous Blood Gluc Sensor (FREESTYLE LIBRE 14 DAY SENSOR) MISC every 14 (fourteen) days.    . empagliflozin (JARDIANCE) 10 MG TABS tablet Take 1 tablet (10 mg total) by mouth daily. 90 tablet 3  . gemfibrozil (LOPID) 600 MG tablet Take 600 mg by mouth 2 (two) times daily before a meal.    . HUMALOG KWIKPEN 100 UNIT/ML KiwkPen Inject 0-10 Units into the skin See admin instructions. Inject 0-10 units into the skin three times a day, per sliding scale- based on BGL >100  1  . HUMULIN N KWIKPEN 100 UNIT/ML Kiwkpen Inject 30 Units into the skin See admin instructions. Inject 30 units into the skin before breakfast and supper  1  . isosorbide mononitrate (IMDUR) 60 MG 24 hr tablet Take 1 tablet (60 mg total) by mouth daily. 90 tablet 1  . Multiple Vitamin (MULTIVITAMIN WITH MINERALS) TABS tablet Take 1 tablet by mouth daily.    . nitroGLYCERIN (NITROSTAT) 0.4 MG SL tablet Place 0.4 mg under the tongue every 5 (five) minutes as needed for chest pain.  1  . Omega-3 Fatty Acids (FISH OIL PO) Take 350 mg by mouth daily  with breakfast.    . Probiotic Product (PROBIOTIC-10 PO) Take 1 capsule by mouth daily.    . rosuvastatin (CRESTOR) 20 MG tablet Take 1 tablet (20 mg total) by mouth daily. 90 tablet 3  . sacubitril-valsartan (ENTRESTO) 24-26 MG Take 1 tablet by mouth 2 (two) times daily. 60 tablet 1  .  torsemide (DEMADEX) 20 MG tablet Take 1 tablet (20 mg total) by mouth 2 (two) times daily at 10 am and 4 pm. 60 tablet 1   No current facility-administered medications for this encounter.    Vitals:   01/22/21 1235  BP: 110/70  Pulse: 79  SpO2: 95%  Weight: 129.9 kg (286 lb 6.4 oz)    PHYSICAL EXAM:  General:  Well appearing. No resp difficulty HEENT: normal Neck: supple. no JVD. Carotids 2+ bilat; no bruits. No lymphadenopathy or thryomegaly appreciated. Cor: PMI nondisplaced. Regular rate & rhythm. No rubs, gallops or murmurs. Lungs: clear Abdomen: obese soft, nontender, nondistended. No hepatosplenomegaly. No bruits or masses. Good bowel sounds. Extremities: no cyanosis, clubbing, rash, edema Neuro: alert & orientedx3, cranial nerves grossly intact. moves all 4 extremities w/o difficulty. Affect pleasant  ASSESSMENT & PLAN:  1. Acute on chronic systolic heart failure - Echo 07/2016: EF 40%, grade I DD, inferior/lateral hypokinesis - cath 10/11/20 EF 15%.  - Echo 10/12/20 EF 20-25% RV normal - Doing well NYHA II. Volume status looks good on lasix 40 daily  - Continue carvedilol 6.25 bid - Continue Entresto 24/26 bid - Continue jardiance 10 - Off spiro with hyperkalemia and AKI - check labs tomorrow with Dr. Hollie Salk - Previously had long discussion about advanced HF options. No transplant candidate with PAD, obesity and other comorbidities. He states he is not interested in VAD at this time and would be OK if that meant he only had 6 months to live,   2. CAD with recent ACS 12/21 - 12/21 EKG showing new LBBB with pulmonary edema taken to cath where was found to have severe 3V disease unable to  revascularize. Suspect distal RCA is culprit lesion but hs trop > 27K -Cath 10/11/2020: EF 15-20%, LVEDP 29, severe LAD disease w/ collaterals form D1 and small diagonal branches, CX moderate size w/ OM1 occluded at ostium, RCA mod disease in proximal and mid segment subtotall distall No targets for revascularization.Failed CTO of LAD in 2/22 - no options for revascularization - no s/s angina  - Continue DAPT/statin  3. AKI on CKDwith hyperkalemia - Baseline 1.2-1.4 - Recent  Creatinine up to 2.2 - Check labs today  4. DM uncontrolled - hx of DM foot wound, s/p amputation 3 toes - A1c 8.0% (down from 8.8% Aug 2021) - Now on Jardiance - management per PCP  5. PAD s/p R SFA occlusion requiring DES -Successful PTA and stenting of right SFA, prolonged procedure, difficult procedure with use of multiple balloons, guidewires and catheters. 100% stenosis reduced to 0% with implantation of 3 overlapping 6.0 x 120 x2; 6.0 x 100 mm Eluvia DES. - claudication resolved  Glori Bickers, MD  12:54 PM

## 2021-01-22 NOTE — Patient Instructions (Signed)
Congratulations!!! You have graduated the Heart Failure Clinic  Follow up AS NEEDED

## 2021-01-23 LAB — MICROALBUMIN, URINE: Microalb, Ur: 29.4

## 2021-01-29 ENCOUNTER — Encounter: Payer: Self-pay | Admitting: *Deleted

## 2021-01-29 ENCOUNTER — Encounter: Payer: Self-pay | Admitting: Family Medicine

## 2021-02-03 ENCOUNTER — Other Ambulatory Visit: Payer: Self-pay

## 2021-02-03 MED ORDER — CARVEDILOL 3.125 MG PO TABS: 3.1250 mg | ORAL_TABLET | Freq: Two times a day (BID) | ORAL | 2 refills | Status: DC

## 2021-02-04 LAB — BASIC METABOLIC PANEL
BUN/Creatinine Ratio: 20 (ref 10–24)
BUN: 35 mg/dL — ABNORMAL HIGH (ref 8–27)
CO2: 24 mmol/L (ref 20–29)
Calcium: 9.1 mg/dL (ref 8.6–10.2)
Chloride: 104 mmol/L (ref 96–106)
Creatinine, Ser: 1.77 mg/dL — ABNORMAL HIGH (ref 0.76–1.27)
Glucose: 134 mg/dL — ABNORMAL HIGH (ref 65–99)
Potassium: 5.2 mmol/L (ref 3.5–5.2)
Sodium: 144 mmol/L (ref 134–144)
eGFR: 42 mL/min/{1.73_m2} — ABNORMAL LOW (ref >59)

## 2021-02-04 NOTE — Progress Notes (Signed)
Renal function has improved and sodium-potassium are stable.

## 2021-02-05 NOTE — Progress Notes (Signed)
Primary Physician/Referring:  Rita Ohara, MD  Patient ID: Eduardo [name], male    DOB: 22-Apr-1956, 65 y.o.   MRN: [id number]  Chief Complaint  Patient presents with  . Cardiomyopathy  . Follow-up   HPI:    BRODRIC SCHAUER  is a 65 y.o. Caucasian male  with controlled type 2 diabetes, hypertension, hyperlipidemia, coronary artery disease by angiography on 09/08/2016 revealing severe triple-vessel CAD with no targets for CABG, he presented with new onset left bundle branch block and acute fulminant pulmonary edema needing intubation on 10/11/2020, emergent cardiac catheterization essentially revealing progression of severe native vessel disease with no significant option for revascularization.  Although LAD was felt to be amenable for potential revascularization, it was unchanged from prior cardiac catheterization and RCA was felt to be the culprit, in view of diffuse disease medical management was recommended.  Past medical history is also significant for morbid obesity, diabetes mellitus with stage III AV chronic kidney disease, mild carotid atherosclerosis, right SFA occlusion S/P complex PV angiogram on 02/28/2019 with PTA to occluded right SFA with implantation of 3 overlapping 6.0 x 120 x2; 6.0 x 100 mm Eluvia DES for critical right leg ischemia now resolved. 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 He has mild disease in the left leg.  PAD has remained stable. Due to frequent PVCs, he was also started on amiodarone  prophylactically.   Patient was hospitalized 12/28/2020-01/02/2021 for acute on chronic systolic heart failure.  Patient was diuresed well, and given ischemic cardiomyopathy as etiology of heart failure attempted high risk PCI to LAD CTO, however it was unsuccessful. Conservative medical management was recommended, with consideration of palliative care involvement.  Patient was previously on spironolactone, however due to renal insufficiency this has been  discontinued.  Patient presents for 4-week follow-up of chronic systolic heart failure and ischemic cardiomyopathy.  Since last visit patient followed up with advanced heart failure clinic, who has recommended as needed follow-up with them at this point.  Since last visit patient has been seen by Dr. Hollie Salk of nephrology who recommended he stop potassium supplements.  Patient therefore has not been on spironolactone or potassium supplements for approximately 1 month.  His most recent potassium is 5.2.  Overall patient is feeling well.  Denies chest pain, syncope, near syncope.  Denies orthopnea, PND.  He continues to have chronic bilateral lower leg edema, which is stable.  Notably he recently returned from a fishing trip in [place], and is planning another fishing trip in approximately 2 weeks to [place].  Past Medical History:  Diagnosis Date  . CHF (congestive heart failure) ([place])   . Chronic kidney disease    stage 3  . Colon polyp   . Coronary artery disease   . Diabetes mellitus 1987   under care of Dr. Chalmers Cater.  On insulin since 96 (off and on)  . Diabetic retinopathy   . Dupuytren contracture    R hand, s/p injection (Dr. Lenon Curt)  . Essential hypertension, benign   . Essential hypertension, benign 02/06/2019  . Frequency of urination and polyuria   . Hypertension   . Myocardial infarction [facility])    denies  . Neuromuscular disorder ([place])    Diabetic neuropathy  . Osteomyelitis ([place])    right foot  . Other testicular hypofunction   . Peripheral arterial disease ([place]) 10/28/2012  . Peritoneal abscess ([place]) 6/08   and buttock.  . Pneumonia   . Polydipsia   . Proteinuria   .  Pure hyperglyceridemia   . Subacute osteomyelitis, right ankle and foot ([place])   . Wears glasses    Past Surgical History:  Procedure Laterality Date  . ABDOMINAL AORTAGRAM N/A 04/18/2012   Procedure: ABDOMINAL Maxcine Ham;  Surgeon: Angelia Mould, MD;  Location: [facility] CATH LAB;  Service: Cardiovascular;   Laterality: N/A;  . AMPUTATION Right 05/19/2019   Procedure: RIGHT FOOT 5TH RAY AMPUTATION;  Surgeon: Newt Minion, MD;  Location: [place];  Service: Orthopedics;  Laterality: Right;  . CARDIAC CATHETERIZATION N/A 09/08/2016   Procedure: Left Heart Cath and Coronary Angiography;  Surgeon: Adrian Prows, MD;  Location: [place] CV LAB;  Service: Cardiovascular;  Laterality: N/A;  . CATARACT EXTRACTION, BILATERAL  09/2017, 10/2017   Dr. Herbert Deaner  . COLONOSCOPY W/ BIOPSIES AND POLYPECTOMY    . CORONARY/GRAFT ACUTE MI REVASCULARIZATION N/A 10/11/2020   Procedure: Coronary/Graft Acute MI Revascularization;  Surgeon: Adrian Prows, MD;  Location: [place] CV LAB;  Service: Cardiovascular;  Laterality: N/A;  . LEFT HEART CATH N/A 12/31/2020   Procedure: Left Heart Cath;  Surgeon: Adrian Prows, MD;  Location: [place] CV LAB;  Service: Cardiovascular;  Laterality: N/A;  . LEFT HEART CATH AND CORONARY ANGIOGRAPHY N/A 10/11/2020   Procedure: LEFT HEART CATH AND CORONARY ANGIOGRAPHY;  Surgeon: Adrian Prows, MD;  Location: [place] CV LAB;  Service: Cardiovascular;  Laterality: N/A;  . LOWER EXTREMITY ANGIOGRAM Bilateral 04/18/2012   Procedure: LOWER EXTREMITY ANGIOGRAM;  Surgeon: Angelia Mould, MD;  Location: [facility] CATH LAB;  Service: Cardiovascular;  Laterality: Bilateral;  bilat lower extrem angio  . LOWER EXTREMITY ANGIOGRAPHY Bilateral 05/02/2019   Procedure: LOWER EXTREMITY ANGIOGRAPHY;  Surgeon: Adrian Prows, MD;  Location: [place] CV LAB;  Service: Cardiovascular;  Laterality: Bilateral;  . LOWER EXTREMITY ANGIOGRAPHY Bilateral 02/28/2019   Procedure: LOWER EXTREMITY ANGIOGRAPHY;  Surgeon: Adrian Prows, MD;  Location: [place] CV LAB;  Service: Cardiovascular;  Laterality: Bilateral;  . macular photocoagulation     (eye treatments for diabetic retinopathy)-Dr. Zigmund Daniel  . PERIPHERAL VASCULAR INTERVENTION  02/28/2019   Procedure: PERIPHERAL VASCULAR INTERVENTION;  Surgeon: Adrian Prows, MD;   Location: [place] CV LAB;  Service: Cardiovascular;;  . VENTRICULAR ASSIST DEVICE INSERTION N/A 12/31/2020   Procedure: VENTRICULAR ASSIST DEVICE INSERTION;  Surgeon: Adrian Prows, MD;  Location: [place] CV LAB;  Service: Cardiovascular;  Laterality: N/A;    Family History  Problem Relation Age of Onset  . Diabetes Mother   . Hearing loss Mother   . Hypertension Mother   . Hyperlipidemia Mother   . Heart disease Mother   . Varicose Veins Mother   . Varicose Veins Father   . Dementia Father   . Hyperlipidemia Brother   . Diabetes Maternal Grandmother    Social History   Tobacco Use  . Smoking status: Former Smoker    Packs/day: 1.00    Years: 30.00    Pack years: 30.00    Types: Cigarettes    Quit date: 01/08/2012    Years since quitting: 9.0  . Smokeless tobacco: Never Used  Substance Use Topics  . Alcohol use: Yes    Comment: rare   Marital Status: Widowed ROS  Review of Systems  Constitutional: Negative for malaise/fatigue and weight gain.  Cardiovascular: Positive for dyspnea on exertion (stable). Negative for chest pain, claudication, leg swelling, near-syncope, orthopnea, palpitations, paroxysmal nocturnal dyspnea and syncope.  Respiratory: Negative for shortness of breath.   Hematologic/Lymphatic: Does not bruise/bleed easily.  Gastrointestinal: Negative for melena.  Neurological: Negative for dizziness and weakness.  All other systems reviewed and are negative.  Objective  Blood pressure 109/62, pulse 71, resp. rate 16, height _0  (1.778 m), weight 284 lb 9.6 oz (129.1 kg), SpO2 97 %.  Vitals with BMI 02/06/2021 01/22/2021 01/09/2021  Height _1  - _2   Weight 284 lbs 10 oz 286 lbs 6 oz 287 lbs 3 oz  BMI 40.84 61.68 37.29  Systolic 021 115 520  Diastolic 62 70 61  Pulse 71 79 57     Physical Exam Constitutional:      Appearance: He is well-developed.     Comments: Morbidly obese, in no acute distress  HENT:     Head: Atraumatic.  Neck:      Thyroid: No thyromegaly.     Comments: Short neck and difficult to evaluate JVP Cardiovascular:     Pulses:          Carotid pulses are 2+ on the right side and 2+ on the left side.      Popliteal pulses are 0 on the right side and 2+ on the left side.       Dorsalis pedis pulses are 1+ on the right side and 2+ on the left side.       Posterior tibial pulses are 0 on the right side and 1+ on the left side.     Heart sounds: No murmur heard. No gallop.      Comments: Femoral and popliteal pulse difficult to feel due to patient's body habitus. Pulmonary:     Effort: Pulmonary effort is normal.     Breath sounds: No wheezing, rhonchi or rales.  Abdominal:     Comments: Obese. Pannus present  Musculoskeletal:        General: Normal range of motion.     Cervical back: Neck supple.     Right lower leg: Edema (1+ pitting) present.     Left lower leg: Edema (1+ pitting ) present.  Skin:    General: Skin is dry.  Neurological:     General: No focal deficit present.     Mental Status: He is alert and oriented to person, place, and time.    Laboratory examination:   Recent Labs    11/28/20 0930 12/05/20 1016 12/19/20 1031 12/28/20 1108 12/31/20 0318 01/01/21 0045 01/02/21 0038 01/14/21 1150 02/03/21 1532  NA 147* 144 144   < > 142 136 139 143 144  K 4.9 4.8 4.8   < > 4.9 5.0 3.9 5.2 5.2  CL 106 103 105   < > 106 105 105 103 104  CO2 _3 < > 28 20* 24 16* 24  GLUCOSE 84 113* 159*   < > 68* 182* 46* 111* 134*  BUN 35* 38* 47*   < > 32* 25* 26* 63* 35*  CREATININE 1.55* 1.63* 1.82*   < > 1.75* 1.50* 1.47* 2.21* 1.77*  CALCIUM 9.3 8.8 9.3   < > 8.8* 8.5* 8.9 8.8 9.1  GFRNONAA 46* 44* 38*   < > 43* 51* 53*  --   --   GFRAA 54* 50* 44*  --   --   --   --   --   --    < > = values in this interval not displayed.   estimated creatinine clearance is 56.1 mL/min (A) (by C-G formula based on SCr of 1.77 mg/dL (H)).  CMP Latest Ref Rng & Units 02/03/2021 01/14/2021 01/02/2021  Glucose 65 - 99 mg/dL 134(H) 111(H) 46(L)  BUN 8 - 27 mg/dL 35(H) 63(H) 26(H)  Creatinine 0.76 - 1.27 mg/dL 1.77(H) 2.21(H) 1.47(H)  Sodium 134 - 144 mmol/L 144 143 139  Potassium 3.5 - 5.2 mmol/L 5.2 5.2 3.9  Chloride 96 - 106 mmol/L 104 103 105  CO2 20 - 29 mmol/L 24 16(L) 24  Calcium 8.6 - 10.2 mg/dL 9.1 8.8 8.9  Total Protein 6.5 - 8.1 g/dL - - -  Total Bilirubin 0.3 - 1.2 mg/dL - - -  Alkaline Phos 38 - 126 U/L - - -  AST 15 - 41 U/L - - -  ALT 0 - 44 U/L - - -   CBC Latest Ref Rng & Units 01/01/2021 12/28/2020 10/22/2020  WBC 4.0 - 10.5 K/uL 8.0 9.9 7.1  Hemoglobin 13.0 - 17.0 g/dL 14.9 17.2(H) 15.6  Hematocrit 39.0 - 52.0 % 45.4 52.9(H) 46.2  Platelets 150 - 400 K/uL 136(L) 186 259   Lipid Panel     Component Value Date/Time   CHOL 137 06/24/2020 0937   TRIG 144 10/12/2020 0511   HDL 33 (L) 06/24/2020 0937   CHOLHDL 4.2 06/24/2020 0937   CHOLHDL 4.4 09/28/2011 1358   VLDL 21 09/28/2011 1358   LDLCALC 72 06/24/2020 0937    HEMOGLOBIN A1C Lab Results  Component Value Date   HGBA1C 6.7 11/19/2020   MPG 182.9 10/11/2020   TSH Recent Labs    06/24/20 0937  TSH 1.650   External Labs:  Glucose Random 453.000 M 09/08/2019 BUN 38.000 M 09/08/2019 Creatinine, Serum 1.340 MG/ 09/08/2019  PSA 3.300 06/12/2019 01/19/2018: Cholesterol 115, triglycerides 112, HDL 39, LDL 54.  Glucose 125, creatinine 1.16, potassium 5.2, EGFR 67, CMP otherwise normal.  CBC normal.  TSH 1.5.  Medications and allergies   Allergies  Allergen Reactions  . Shellfish-Derived Products Nausea And Vomiting and Other (See Comments)    Mussels cause severe nausea and vomiting   . Latex Rash  . Tape Rash and Other (See Comments)    Caused issues with the skin  . Testosterone Rash    Current Outpatient Medications on File Prior to Visit  Medication Sig Dispense Refill  . acetaminophen (TYLENOL) 325 MG tablet Take 650 mg by mouth every 6 (six) hours as needed for mild pain (or headaches).     Marland Kitchen aspirin 81 MG chewable tablet Chew 1 tablet (81 mg total) by mouth daily.    . B-D UF III MINI PEN NEEDLES 31G X 5 MM MISC 6 (six) times daily.  0  . Bioflavonoid Products (ESTER C PO) Take 1,000 mg by mouth daily.    . carvedilol (COREG) 3.125 MG tablet Take 1 tablet (3.125 mg total) by mouth 2 (two) times daily with a meal. 90 tablet 2  . Cholecalciferol (VITAMIN D) 2000 units CAPS Take 2,000 Units by mouth daily.    . clopidogrel (PLAVIX) 75 MG tablet Take 1 tablet (75 mg total) by mouth daily. 90 tablet 1  . Continuous Blood Gluc Sensor (FREESTYLE LIBRE 14 DAY SENSOR) MISC every 14 (fourteen) days.    . empagliflozin (JARDIANCE) 10 MG TABS tablet Take 1 tablet (10 mg total) by mouth daily. 90 tablet 3  . gemfibrozil (LOPID) 600 MG tablet Take 600 mg by mouth 2 (two) times daily before a meal.    . HUMALOG KWIKPEN 100 UNIT/ML KiwkPen Inject 0-10 Units into the skin See admin instructions. Inject 0-10 units into the skin three times a day, per sliding  scale- based on BGL >100  1  . HUMULIN N KWIKPEN 100 UNIT/ML Kiwkpen Inject 30 Units into the skin See admin instructions. Inject 30 units into the skin before breakfast and supper  1  . isosorbide mononitrate (IMDUR) 60 MG 24 hr tablet Take 1 tablet (60 mg total) by mouth daily. 90 tablet 1  . Multiple Vitamin (MULTIVITAMIN WITH MINERALS) TABS tablet Take 1 tablet by mouth daily.    . nitroGLYCERIN (NITROSTAT) 0.4 MG SL tablet Place 0.4 mg under the tongue every 5 (five) minutes as needed for chest pain.  1  . Omega-3 Fatty Acids (FISH OIL PO) Take 350 mg by mouth daily with breakfast.    . Probiotic Product (PROBIOTIC-10 PO) Take 1 capsule by mouth daily.    . rosuvastatin (CRESTOR) 20 MG tablet Take 1 tablet (20 mg total) by mouth daily. 90 tablet 3  . sacubitril-valsartan (ENTRESTO) 24-26 MG Take 1 tablet by mouth 2 (two) times daily. 60 tablet 1  . torsemide (DEMADEX) 20 MG tablet Take 1 tablet (20 mg total) by mouth 2 (two) times daily at  10 am and 4 pm. 60 tablet 1   No current facility-administered medications on file prior to visit.    There are no discontinued medications.  Radiology:   DG CHEST PORT 1 VIEW 10/12/2020: COMPARISON: 10/11/2020 FINDINGS: Vascular congestion with bilateral lower lung zone airspace opacities are without change. Upper lungs are clear. Suspect small effusions. No pneumothorax. Endotracheal tube, right internal jugular central venous line and nasal/orogastric tube are stable. IMPRESSION: 1. No change from the previous day's exam. 2. Persistent vascular congestion and lower lung zone airspace opacities, the latter finding likely due to a combination of pleural fluid with atelectasis, with a possible component of either edema or infection.   Cardiac Studies:     Exercise sestamibi stress test 08/17/2016: 1. Resting EKG demonstrates normal sinus rhythm, left axis deviation, left plantar fascicular block.  Poor R-wave progression, ulnar disease pattern.  Nonspecific ST-T abnormality, cannot exclude lateral ischemia.  Stress EKG is equivocal for ischemia, patient developing atypical left otherwise for with exercise which reverted back to baseline immediately on combination of the stress test less than 60 seconds.  There was no additional ST-T wave changes of ischemia. Patient exercised on Bruce protocol for 4:25 minutes and achieved 5.45 METS. Stress test terminated due to 89 % MPHR achieved (Target HR >85%). Symptoms included dizziness.  2.  2-Day protocol followed. Perfusion imaging studies demonstrate large sized severe perfusion defect involving the inferior, anterior and anterolateral consistent with inferior wall scar with moderate peri-infarct ischemia and severe anterior and anteroapical reversible ischemia extending from the base towards the apex.  Left ventricular systolic function calculating by QGS was markedly depressed at 26%.  This is a high risk study, consider further cardiac work-up.    Carotid artery duplex 02/16/2018: No hemodynamically significant arterial disease in the internal carotid artery bilaterally. Mild heterogenous plaque noted.  Antegrade right vertebral artery flow. Antegrade left vertebral artery flow. Compared to the study done on 09/01/2016, left carotid noted as occlusion is an error. This may perhaps be due to low velocity noted in both carotid arteries and may indicate low systemic BP or reduced cardiac output. Clinical correlation recommended.  Peripheral arteriogram 02/28/2019: Pelvic aortogram with limited bifemoral arteriogram revealed patent distal abdominal aorta without aneurysm, patent, bilateral iliac and common femoral vessels. Right SFA is occluded in the ostium and reconstitutes outside of the Hunter's canal.  Below the right knee  there is two-vessel runoff, AT is occluded.  Left leg was not studied beyond left common femoral artery. Successful PTA and stenting of right SFA, prolonged procedure, difficult procedure with use of multiple balloons, guidewires and catheters.  100% stenosis reduced to 0% with implantation of 3 overlapping 6.0 x 120 x2; 6.0 x 100 mm Eluvia DES. 180 mL contrast utilized.  Peripheral arteriogram 05/02/2019: Right common femoral artery and right proximal SFA previously placed stent widely patent.  Right iliac artery shows mild disease. Left iliac artery and left femoral arteries show very mild disease.  There is two-vessel runoff in the left leg, left AT is occluded. Intermediate stenosis noted in the left distal and proximal SFA, pressure pullback reveals no significant gradient.   ABI 08/09/2019: This exam reveals normal perfusion of the right lower extremity (ABI). This exam reveals normal perfusion of the left lower extremity (ABI).  The ABI may be falsely elevated due to medial calcinosis from diabetes. No significant change since 05/11/2019. Patient has h/o right SFA stenting.   Left Heart Catheterization  10/11/20: LV: Severely dilated. Global hypokinesis. Hand contrast injection hence not fully adequately visualized. LVEF 15 to 20%. EDP markedly elevated at 29 mmHg. No pressure gradient across the aortic valve. Left main: Normal. LAD: Severely diffusely diseased. Gives origin to large D1 which gives collaterals to the LAD and 2 smaller diagonals, LAD is occluded after the origin of D1, anatomy compared to prior in 2017 reveals progression of diffuse disease. There are ipsilateral and contralateral collaterals to the LAD. CX: Moderate sized vessel, giving origin to large OM1. OM1 is occluded in the ostium as was noted previously and has ipsilateral collaterals. OM1 is diffusely diseased and faintly filled. RCA: Moderate disease in the proximal and mid segment. At the bifurcation of PDA and PL, there is a high-grade 90% stenosis. The bifurcation is also involved with at least a 90% stenosis in the PDA and a 60 to 70% stenosis in the PL branch. There is no target as distally as the PL branch which is large is occluded distally and that was new from 2017. There are faint collaterals noted from the left to the RCA.  Impression: Severe native vessel three-vessel coronary artery disease with no significant targets for revascularization, LAD may be amenable for revascularization but I am not sure that this would help, anatomy similar to 2017 but progression to diffuse disease. Patient is extremely ill with high risk for mortality. He also has underlying stage III-IV kidney disease and contrast nephropathy needs to be evaluated further in view of contrast load of 70 mL.  Echocardiogram 12/29/2020: 1. Left ventricular ejection fraction, by estimation, is <20%. The left ventricle has severely decreased function. The left ventricle demonstrates regional wall motion abnormalities (see scoring diagram/findings for description). The left ventricular internal cavity size was moderately dilated. There is  mild left ventricular hypertrophy. Left ventricular diastolic parameters are consistent with Grade II diastolic dysfunction (pseudonormalization). Elevated left atrial pressure. Akinetic inferolateral and apical myocardium. Moderate anteroseptal hypokinesis.  2. Right ventricular systolic function is low normal. The right ventricular size is normal.  3. The mitral valve is grossly normal. Mild to moderate mitral valve regurgitation.  4. The aortic valve is tricuspid. Aortic valve regurgitation is not visualized.  5. The inferior vena cava is dilated in size with <50% respiratory variability, suggesting right atrial pressure of 15 mmHg.  6. Compared to previous study in 10/2020, LVEF has further decreased from 20-25% to 15-20%.  Coronary Angioplasty 12/31/20 Impella  support high risk intervention  Unsuccessful attempt at proximal LAD CTO intervention in spite of use of support catheters and multiple wires. 129 ml contrast used. No immediate complications.  Continue guideline directed medical therapy for heart failure discuss regarding any future attempts of revascularization.   EKG   EKG 08/11/2020: Sinus tachycardia at rate of 120 bpm, atypical left bundle branch block. No further analysis.  EKG 09/13/2020: Normal sinus rhythm at rate of 91 bpm, left atrial enlargement, inferior infarct old. Anteroseptal infarct old. Nonspecific T abnormality. Compared to 08/11/2020, left bundle branch block not present.  EKG 04/19/2020: Normal sinus rhythm at rate of 61 bpm, inferior infarct old.  Anteroseptal infarct old.  Nonspecific T abnormality.  Borderline low voltage complexes.  No significant change from 11/08/2019.   Assessment     ICD-10-CM   1. Chronic systolic heart failure (HCC)  K44.01 Basic metabolic panel    CBC    CBC    Basic metabolic panel  2. Ischemic cardiomyopathy  U27.2 Basic metabolic panel    CBC    CBC    Basic metabolic panel  3. CAD in native artery  I25.10    4. Stage 3b chronic kidney disease (HCC)  N18.32    No orders of the defined types were placed in this encounter.  There are no discontinued medications.     Recommendations:    COSTAS SENA  is a 65 y.o. Caucasian male  with controlled type 2 diabetes, hypertension, hyperlipidemia, coronary artery disease by angiography on 09/08/2016 revealing severe triple-vessel CAD with no targets for CABG, he presented with new onset left bundle branch block and acute fulminant pulmonary edema needing intubation on 10/11/2020, emergent cardiac catheterization essentially revealing progression of severe native vessel disease with no significant option for revascularization.  Although LAD was felt to be amenable for potential revascularization, it was unchanged from prior cardiac catheterization and RCA was felt to be the culprit, in view of diffuse disease medical management was recommended.  Past medical history is also significant for morbid obesity, diabetes mellitus with stage III AV chronic kidney disease, mild carotid atherosclerosis, right SFA occlusion S/P complex PV angiogram on 02/28/2019 with PTA to occluded right SFA with implantation of 3 overlapping 6.0 x 120 x2; 6.0 x 100 mm Eluvia DES for critical right leg ischemia now resolved. 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 He has mild disease in the left leg.  PAD has remained stable. Due to frequent PVCs, he was also started on amiodarone prophylactically.   Patient was again hospitalized 12/28/2020-01/02/2021 for acute on chronic systolic heart failure he diuresed well and underwent unsuccessful attempt to proximal LAD CTO intervention, was again recommended continued medical therapy.  Due to renal insufficiency while on spironolactone, this has been discontinued.   Patient presents for 4-week follow-up of chronic systolic heart failure and ischemic cardiomyopathy.  Since last visit patient followed up with advanced heart  failure clinic, who has recommended as needed follow-up with them at this point.  Patient was recently seen by nephrology, Dr. Hollie Salk, who also discontinued potassium supplements as most recent potassium was 5.2.  Despite discontinuation of spironolactone and potassium supplements, patient's potassium remains at 5.2.  We will continue to monitor closely.  If potassium is >5.5, will consider restarting Lokelma if necessary.  Overall patient is feeling well and tolerating guideline directed medical therapy without issue including aspirin, carvedilol, Jardiance, Entresto, rosuvastatin, and Plavix.  We will continue current medications at this  time.  Patient continues to feel well is able to live his life to a quality suicide with plan will continue to refrain from palliative care referral.   Follow-up in 3 months, sooner if needed, for heart failure and hyperkalemia.  Will obtain repeat BMP and CBC prior to next office visit.  Patient was seen in collaboration with Dr. Einar Gip and he is in agreement with the plan.   Alethia Berthold, PA-C 02/06/2021, 11:19 AM Office: [phone]  CC: Madelon Lips, MD

## 2021-02-06 ENCOUNTER — Ambulatory Visit: Payer: Medicare Other | Admitting: Student

## 2021-02-06 ENCOUNTER — Other Ambulatory Visit: Payer: Self-pay

## 2021-02-06 ENCOUNTER — Encounter: Payer: Self-pay | Admitting: Student

## 2021-02-06 VITALS — BP 109/62 | HR 71 | Resp 16 | Ht 70.0 in | Wt 284.6 lb

## 2021-02-06 DIAGNOSIS — N1832 Chronic kidney disease, stage 3b: Secondary | ICD-10-CM

## 2021-02-06 DIAGNOSIS — I2510 Atherosclerotic heart disease of native coronary artery without angina pectoris: Secondary | ICD-10-CM

## 2021-02-06 DIAGNOSIS — I255 Ischemic cardiomyopathy: Secondary | ICD-10-CM

## 2021-02-06 DIAGNOSIS — I5022 Chronic systolic (congestive) heart failure: Secondary | ICD-10-CM

## 2021-03-03 ENCOUNTER — Other Ambulatory Visit: Payer: Self-pay

## 2021-03-03 MED ORDER — TORSEMIDE 20 MG PO TABS: 20.0000 mg | ORAL_TABLET | Freq: Two times a day (BID) | ORAL | 1 refills | Status: DC

## 2021-03-03 MED ORDER — ENTRESTO 24-26 MG PO TABS: 1.0000 | ORAL_TABLET | Freq: Two times a day (BID) | ORAL | 1 refills | Status: DC

## 2021-03-11 ENCOUNTER — Other Ambulatory Visit: Payer: Self-pay

## 2021-03-11 ENCOUNTER — Encounter (INDEPENDENT_AMBULATORY_CARE_PROVIDER_SITE_OTHER): Payer: Medicare Other | Admitting: Ophthalmology

## 2021-03-11 DIAGNOSIS — H43813 Vitreous degeneration, bilateral: Secondary | ICD-10-CM

## 2021-03-11 DIAGNOSIS — E113592 Type 2 diabetes mellitus with proliferative diabetic retinopathy without macular edema, left eye: Secondary | ICD-10-CM | POA: Diagnosis not present

## 2021-03-11 DIAGNOSIS — I10 Essential (primary) hypertension: Secondary | ICD-10-CM

## 2021-03-11 DIAGNOSIS — H35033 Hypertensive retinopathy, bilateral: Secondary | ICD-10-CM

## 2021-03-11 DIAGNOSIS — E113391 Type 2 diabetes mellitus with moderate nonproliferative diabetic retinopathy without macular edema, right eye: Secondary | ICD-10-CM | POA: Diagnosis not present

## 2021-03-19 LAB — HEMOGLOBIN A1C: Hemoglobin A1C: 6.5

## 2021-03-20 ENCOUNTER — Encounter: Payer: Self-pay | Admitting: *Deleted

## 2021-04-23 ENCOUNTER — Other Ambulatory Visit: Payer: Self-pay

## 2021-04-23 MED ORDER — CARVEDILOL 3.125 MG PO TABS: 3.1250 mg | ORAL_TABLET | Freq: Two times a day (BID) | ORAL | 3 refills | Status: DC

## 2021-04-29 ENCOUNTER — Other Ambulatory Visit: Payer: Self-pay

## 2021-04-29 MED ORDER — ISOSORBIDE MONONITRATE ER 60 MG PO TB24: 60.0000 mg | ORAL_TABLET | Freq: Every day | ORAL | 1 refills | Status: DC

## 2021-05-05 ENCOUNTER — Other Ambulatory Visit: Payer: Self-pay | Admitting: Cardiology

## 2021-05-06 LAB — BASIC METABOLIC PANEL
BUN/Creatinine Ratio: 31 — ABNORMAL HIGH (ref 10–24)
BUN: 55 mg/dL — ABNORMAL HIGH (ref 8–27)
CO2: 24 mmol/L (ref 20–29)
Calcium: 9.2 mg/dL (ref 8.6–10.2)
Chloride: 104 mmol/L (ref 96–106)
Creatinine, Ser: 1.79 mg/dL — ABNORMAL HIGH (ref 0.76–1.27)
Glucose: 105 mg/dL — ABNORMAL HIGH (ref 65–99)
Potassium: 5.3 mmol/L — ABNORMAL HIGH (ref 3.5–5.2)
Sodium: 142 mmol/L (ref 134–144)
eGFR: 42 mL/min/{1.73_m2} — ABNORMAL LOW (ref >59)

## 2021-05-06 LAB — CBC
Hematocrit: 50.6 % (ref 37.5–51.0)
Hemoglobin: 16.8 g/dL (ref 13.0–17.7)
MCH: 29.9 pg (ref 26.6–33.0)
MCHC: 33.2 g/dL (ref 31.5–35.7)
MCV: 90 fL (ref 79–97)
Platelets: 164 10*3/uL (ref 150–450)
RBC: 5.61 x10E6/uL (ref 4.14–5.80)
RDW: 13.4 % (ref 11.6–15.4)
WBC: 7.5 10*3/uL (ref 3.4–10.8)

## 2021-05-07 NOTE — Progress Notes (Signed)
Primary Physician/Referring:  Rita Ohara, MD  Patient ID: Eduardo [name], male    DOB: 1956-09-04, 65 y.o.   MRN: [id number]  Chief Complaint  Patient presents with   Chronic systolic heart failure    Follow-up   HPI:    Eduardo [name]  is a 65 y.o. Caucasian male  with controlled type 2 diabetes, hypertension, hyperlipidemia, coronary artery disease by angiography on 09/08/2016 revealing severe triple-vessel CAD with no targets for CABG, he presented with new onset left bundle branch block and acute fulminant pulmonary edema needing intubation on 10/11/2020, emergent cardiac catheterization essentially revealing progression of severe native vessel disease with no significant option for revascularization.  Although LAD was felt to be amenable for potential revascularization, it was unchanged from prior cardiac catheterization and RCA was felt to be the culprit, in view of diffuse disease medical management was recommended.  Past medical history is also significant for morbid obesity, diabetes mellitus with stage III AV chronic kidney disease, mild carotid atherosclerosis, right SFA occlusion S/P complex PV angiogram on 02/28/2019 with PTA to occluded right SFA with implantation of 3 overlapping 6.0 x 120 x2; 6.0 x 100 mm Eluvia DES for critical right leg ischemia now resolved. 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 He has mild disease in the left leg.  PAD has remained stable. Due to frequent PVCs, he was also started on amiodarone  prophylactically.   Patient was hospitalized 12/28/2020-01/02/2021 for acute on chronic systolic heart failure.  Patient was diuresed well, and given ischemic cardiomyopathy as etiology of heart failure attempted high risk PCI to LAD CTO, however it was unsuccessful. Conservative medical management was recommended, with consideration of palliative care involvement.  Patient was previously on spironolactone, however due to renal insufficiency this has  been discontinued.  Patient presents for 86-monthfollow-up of heart failure and hypokalemia.  At last visit no changes were made, however discussed that if potassium is >5.5 could consider restarting Lokelma if necessary.  BMP 2 days ago on 05/05/2021 revealed potassium 5.3. Patient continues to do well despite severe systolic dysfunction. He walks regularly and continues to enjoy fishing and is able to do this without issue. Denies chest pain, syncope, near syncope.  Denies orthopnea, PND.  He continues to have chronic bilateral lower leg edema, which is stable.  Patient continues to follow with Dr. UHollie Salkin nephrology, has appointment next month. Patient continues to choose no VAD and no dialysis if it were needed.   Past Medical History:  Diagnosis Date   CHF (congestive heart failure) (HCC)    Chronic kidney disease    stage 3   Colon polyp    Coronary artery disease    Diabetes mellitus 1987   under care of Dr. BChalmers Cater  On insulin since 96 (off and on)   Diabetic retinopathy    Dupuytren contracture    R hand, s/p injection (Dr. CLenon Curt   Essential hypertension, benign    Essential hypertension, benign 02/06/2019   Frequency of urination and polyuria    Hypertension    Myocardial infarction ([facility]    denies   Neuromuscular disorder ([facility]    Diabetic neuropathy   O[place]omyelitis ([facility]    right foot   Other testicular hypofunction    Peripheral arterial disease ([facility] 10/28/2012   Peritoneal abscess ([facility] 6/08   and buttock.   Pneumonia    Polydipsia    Proteinuria    Pure hyperglyceridemia    Subacute osteomyelitis,  right ankle and foot ([place])    Wears glasses    Past Surgical History:  Procedure Laterality Date   ABDOMINAL AORTAGRAM N/A 04/18/2012   Procedure: ABDOMINAL Maxcine Ham;  Surgeon: Angelia Mould, MD;  Location: [facility] LAB;  Service: Cardiovascular;  Laterality: N/A;   AMPUTATION Right 05/19/2019   Procedure: RIGHT FOOT 5TH RAY AMPUTATION;  Surgeon: Newt Minion, MD;  Location: [place];  Service: Orthopedics;  Laterality: Right;   CARDIAC CATHETERIZATION N/A 09/08/2016   Procedure: Left Heart Cath and Coronary Angiography;  Surgeon: Adrian Prows, MD;  Location: [place] CV LAB;  Service: Cardiovascular;  Laterality: N/A;   CATARACT EXTRACTION, BILATERAL  09/2017, 10/2017   Dr. Herbert Deaner   COLONOSCOPY W/ BIOPSIES AND POLYPECTOMY     CORONARY/GRAFT ACUTE MI REVASCULARIZATION N/A 10/11/2020   Procedure: Coronary/Graft Acute MI Revascularization;  Surgeon: Adrian Prows, MD;  Location: [place] CV LAB;  Service: Cardiovascular;  Laterality: N/A;   LEFT HEART CATH N/A 12/31/2020   Procedure: Left Heart Cath;  Surgeon: Adrian Prows, MD;  Location: [place] CV LAB;  Service: Cardiovascular;  Laterality: N/A;   LEFT HEART CATH AND CORONARY ANGIOGRAPHY N/A 10/11/2020   Procedure: LEFT HEART CATH AND CORONARY ANGIOGRAPHY;  Surgeon: Adrian Prows, MD;  Location: [place] CV LAB;  Service: Cardiovascular;  Laterality: N/A;   LOWER EXTREMITY ANGIOGRAM Bilateral 04/18/2012   Procedure: LOWER EXTREMITY ANGIOGRAM;  Surgeon: Angelia Mould, MD;  Location[facility] C[facility] LAB;  Service: Cardiovascular;  Laterality: Bilateral;  bilat lower extrem angio   LOWER EXTREMITY ANGIOGRAPHY Bilateral 05/02/2019   Procedure: LOWER EXTREMITY ANGIOGRAPHY;  Surgeon: Adrian Prows, MD;  Location: [place] CV LAB;  Service: Cardiovascular;  Laterality: Bilateral;   LOWER EXTREMITY ANGIOGRAPHY Bilateral 02/28/2019   Procedure: LOWER EXTREMITY ANGIOGRAPHY;  Surgeon: Adrian Prows, MD;  Location: [place] CV LAB;  Service: Cardiovascular;  Laterality: Bilateral;   macular photocoagulation     (eye treatments for diabetic retinopathy)-Dr. Zigmund Daniel   PERIPHERAL VASCULAR INTERVENTION  02/28/2019   Procedure: PERIPHERAL VASCULAR INTERVENTION;  Surgeon: Adrian Prows, MD;  Location: [place] CV LAB;  Service: Cardiovascular;;   VENTRICULAR ASSIST DEVICE INSERTION N/A 12/31/2020   Procedure: VENTRICULAR  ASSIST DEVICE INSERTION;  Surgeon: Adrian Prows, MD;  Location: [place] CV LAB;  Service: Cardiovascular;  Laterality: N/A;    Family History  Problem Relation Age of Onset   Diabetes Mother    Hearing loss Mother    Hypertension Mother    Hyperlipidemia Mother    Heart disease Mother    Varicose Veins Mother    Varicose Veins Father    Dementia Father    Hyperlipidemia Brother    Diabetes Maternal Grandmother    Social History   Tobacco Use   Smoking status: Former    Packs/day: 1.00    Years: 30.00    Pack years: 30.00    Types: Cigarettes    Quit date: 01/08/2012    Years since quitting: 9.3   Smokeless tobacco: Never  Substance Use Topics   Alcohol use: Yes    Comment: rare   Marital Status: Widowed ROS  Review of Systems  Constitutional: Negative for malaise/fatigue and weight gain.  Cardiovascular:  Positive for leg swelling (chronic, stable). Negative for chest pain, claudication, near-syncope, orthopnea, palpitations, paroxysmal nocturnal dyspnea and syncope.  Respiratory:  Negative for shortness of breath.   Neurological:  Negative for dizziness.  Objective  Blood pressure 121/73, pulse 74, temperature 97.9 F (36.6 C), temperature source  Temporal, resp. rate 16, height 5' 10"  (1.778 m), weight 282 lb (127.9 kg), SpO2 96 %.  Vitals with BMI 05/08/2021 02/06/2021 01/22/2021  Height 5' 10"  5' 10"  -  Weight 282 lbs 284 lbs 10 oz 286 lbs 6 oz  BMI 40.46 81.19 14.78  Systolic 295 621 308  Diastolic 73 62 70  Pulse 74 71 79     Physical Exam Vitals reviewed.  Constitutional:      Appearance: He is well-developed.     Comments: Morbidly obese, in no acute distress  Neck:     Thyroid: No thyromegaly.     Comments: Short neck and difficult to evaluate JVP Cardiovascular:     Pulses:          Carotid pulses are 2+ on the right side and 2+ on the left side.      Popliteal pulses are 0 on the right side and 2+ on the left side.       Dorsalis pedis pulses are 1+  on the right side and 2+ on the left side.       Posterior tibial pulses are 0 on the right side and 1+ on the left side.     Heart sounds: No murmur heard.   No gallop.     Comments: Femoral and popliteal pulse difficult to feel due to patient's body habitus. Pulmonary:     Effort: Pulmonary effort is normal.     Breath sounds: No wheezing, rhonchi or rales.  Abdominal:     Comments: Obese. Pannus present  Musculoskeletal:        General: Normal range of motion.     Right lower leg: Edema (trace) present.     Left lower leg: Edema (trace) present.  Skin:    General: Skin is dry.  Neurological:     Mental Status: He is alert.   Laboratory examination:   Recent Labs    11/28/20 0930 12/05/20 1016 12/19/20 1031 12/28/20 1108 12/31/20 0318 01/01/21 0045 01/02/21 0038 01/14/21 1150 02/03/21 1532 05/05/21 1503  NA 147* 144 144   < > 142 136 139 143 144 142  K 4.9 4.8 4.8   < > 4.9 5.0 3.9 5.2 5.2 5.3*  CL 106 103 105   < > 106 105 105 103 104 104  CO2 24 23 22    < > 28 20* 24 16* 24 24  GLUCOSE 84 113* 159*   < > 68* 182* 46* 111* 134* 105*  BUN 35* 38* 47*   < > 32* 25* 26* 63* 35* 55*  CREATININE 1.55* 1.63* 1.82*   < > 1.75* 1.50* 1.47* 2.21* 1.77* 1.79*  CALCIUM 9.3 8.8 9.3   < > 8.8* 8.5* 8.9 8.8 9.1 9.2  GFRNONAA 46* 44* 38*   < > 43* 51* 53*  --   --   --   GFRAA 54* 50* 44*  --   --   --   --   --   --   --    < > = values in this interval not displayed.   estimated creatinine clearance is 55.3 mL/min (A) (by C-G formula based on SCr of 1.79 mg/dL (H)).  CMP Latest Ref Rng & Units 05/05/2021 02/03/2021 01/14/2021  Glucose 65 - 99 mg/dL 105(H) 134(H) 111(H)  BUN 8 - 27 mg/dL 55(H) 35(H) 63(H)  Creatinine 0.76 - 1.27 mg/dL 1.79(H) 1.77(H) 2.21(H)  Sodium 134 - 144 mmol/L 142 144 143  Potassium 3.5 - 5.2 mmol/L  5.3(H) 5.2 5.2  Chloride 96 - 106 mmol/L 104 104 103  CO2 20 - 29 mmol/L 24 24 16(L)  Calcium 8.6 - 10.2 mg/dL 9.2 9.1 8.8  Total Protein 6.5 - 8.1 g/dL - -  -  Total Bilirubin 0.3 - 1.2 mg/dL - - -  Alkaline Phos 38 - 126 U/L - - -  AST 15 - 41 U/L - - -  ALT 0 - 44 U/L - - -   CBC Latest Ref Rng & Units 05/05/2021 01/01/2021 12/28/2020  WBC 3.4 - 10.8 x10E3/uL 7.5 8.0 9.9  Hemoglobin 13.0 - 17.7 g/dL 16.8 14.9 17.2(H)  Hematocrit 37.5 - 51.0 % 50.6 45.4 52.9(H)  Platelets 150 - 450 x10E3/uL 164 136(L) 186   Lipid Panel     Component Value Date/Time   CHOL 137 06/24/2020 0937   TRIG 144 10/12/2020 0511   HDL 33 (L) 06/24/2020 0937   CHOLHDL 4.2 06/24/2020 0937   CHOLHDL 4.4 09/28/2011 1358   VLDL 21 09/28/2011 1358   LDLCALC 72 06/24/2020 0937    HEMOGLOBIN A1C Lab Results  Component Value Date   HGBA1C 6.5 03/19/2021   MPG 182.9 10/11/2020   TSH Recent Labs    06/24/20 0937  TSH 1.650   External Labs:  Glucose Random 453.000 M 09/08/2019 BUN 38.000 M 09/08/2019 Creatinine, Serum 1.340 MG/ 09/08/2019  PSA 3.300 06/12/2019 01/19/2018: Cholesterol 115, triglycerides 112, HDL 39, LDL 54.  Glucose 125, creatinine 1.16, potassium 5.2, EGFR 67, CMP otherwise normal.  CBC normal.  TSH 1.5. Allergies   Allergies  Allergen Reactions   Shellfish-Derived Products Nausea And Vomiting and Other (See Comments)    Mussels cause severe nausea and vomiting    Latex Rash   Tape Rash and Other (See Comments)    Caused issues with the skin   Testosterone Rash      Medications Prior to Visit:   Outpatient Medications Prior to Visit  Medication Sig Dispense Refill   acetaminophen (TYLENOL) 325 MG tablet Take 650 mg by mouth every 6 (six) hours as needed for mild pain (or headaches).     aspirin 81 MG chewable tablet Chew 1 tablet (81 mg total) by mouth daily.     B-D UF III MINI PEN NEEDLES 31G X 5 MM MISC 6 (six) times daily.  0   Bioflavonoid Products (ESTER C PO) Take 1,000 mg by mouth daily.     carvedilol (COREG) 3.125 MG tablet Take 1 tablet (3.125 mg total) by mouth 2 (two) times daily with a meal. 90 tablet 3    Cholecalciferol (VITAMIN D) 2000 units CAPS Take 2,000 Units by mouth daily.     clopidogrel (PLAVIX) 75 MG tablet TAKE 1 TABLET BY MOUTH  DAILY 90 tablet 1   Continuous Blood Gluc Sensor (FREESTYLE LIBRE 14 DAY SENSOR) MISC every 14 (fourteen) days.     empagliflozin (JARDIANCE) 10 MG TABS tablet Take 1 tablet (10 mg total) by mouth daily. 90 tablet 3   gemfibrozil (LOPID) 600 MG tablet Take 600 mg by mouth 2 (two) times daily before a meal.     HUMALOG KWIKPEN 100 UNIT/ML KiwkPen Inject 0-10 Units into the skin See admin instructions. Inject 0-10 units into the skin three times a day, per sliding scale- based on BGL >100  1   HUMULIN N KWIKPEN 100 UNIT/ML Kiwkpen Inject 30 Units into the skin See admin instructions. Inject 30 units into the skin before breakfast and supper  1   isosorbide mononitrate (  IMDUR) 60 MG 24 hr tablet Take 1 tablet (60 mg total) by mouth daily. 90 tablet 1   Multiple Vitamin (MULTIVITAMIN WITH MINERALS) TABS tablet Take 1 tablet by mouth daily.     Omega-3 Fatty Acids (FISH OIL PO) Take 350 mg by mouth daily with breakfast.     Probiotic Product (PROBIOTIC-10 PO) Take 1 capsule by mouth daily.     rosuvastatin (CRESTOR) 20 MG tablet Take 1 tablet (20 mg total) by mouth daily. 90 tablet 3   sacubitril-valsartan (ENTRESTO) 24-26 MG Take 1 tablet by mouth 2 (two) times daily. 180 tablet 1   torsemide (DEMADEX) 20 MG tablet Take 1 tablet (20 mg total) by mouth 2 (two) times daily at 10 am and 4 pm. 180 tablet 1   nitroGLYCERIN (NITROSTAT) 0.4 MG SL tablet Place 0.4 mg under the tongue every 5 (five) minutes as needed for chest pain.  1   potassium chloride SA (KLOR-CON) 20 MEQ tablet TAKE 1 TABLET (20 MEQ) BY MOUTH DAILY. 30 tablet 3   spironolactone (ALDACTONE) 25 MG tablet TAKE 1 TABLET (25 MG TOTAL) BY MOUTH DAILY. 30 tablet 1   No facility-administered medications prior to visit.     Final Medications at End of Visit    Current Meds  Medication Sig    acetaminophen (TYLENOL) 325 MG tablet Take 650 mg by mouth every 6 (six) hours as needed for mild pain (or headaches).   aspirin 81 MG chewable tablet Chew 1 tablet (81 mg total) by mouth daily.   B-D UF III MINI PEN NEEDLES 31G X 5 MM MISC 6 (six) times daily.   Bioflavonoid Products (ESTER C PO) Take 1,000 mg by mouth daily.   carvedilol (COREG) 3.125 MG tablet Take 1 tablet (3.125 mg total) by mouth 2 (two) times daily with a meal.   Cholecalciferol (VITAMIN D) 2000 units CAPS Take 2,000 Units by mouth daily.   clopidogrel (PLAVIX) 75 MG tablet TAKE 1 TABLET BY MOUTH  DAILY   Continuous Blood Gluc Sensor (FREESTYLE LIBRE 14 DAY SENSOR) MISC every 14 (fourteen) days.   empagliflozin (JARDIANCE) 10 MG TABS tablet Take 1 tablet (10 mg total) by mouth daily.   gemfibrozil (LOPID) 600 MG tablet Take 600 mg by mouth 2 (two) times daily before a meal.   HUMALOG KWIKPEN 100 UNIT/ML KiwkPen Inject 0-10 Units into the skin See admin instructions. Inject 0-10 units into the skin three times a day, per sliding scale- based on BGL >100   HUMULIN N KWIKPEN 100 UNIT/ML Kiwkpen Inject 30 Units into the skin See admin instructions. Inject 30 units into the skin before breakfast and supper   isosorbide mononitrate (IMDUR) 60 MG 24 hr tablet Take 1 tablet (60 mg total) by mouth daily.   Multiple Vitamin (MULTIVITAMIN WITH MINERALS) TABS tablet Take 1 tablet by mouth daily.   Omega-3 Fatty Acids (FISH OIL PO) Take 350 mg by mouth daily with breakfast.   Probiotic Product (PROBIOTIC-10 PO) Take 1 capsule by mouth daily.   rosuvastatin (CRESTOR) 20 MG tablet Take 1 tablet (20 mg total) by mouth daily.   sacubitril-valsartan (ENTRESTO) 24-26 MG Take 1 tablet by mouth 2 (two) times daily.   torsemide (DEMADEX) 20 MG tablet Take 1 tablet (20 mg total) by mouth 2 (two) times daily at 10 am and 4 pm.   [DISCONTINUED] nitroGLYCERIN (NITROSTAT) 0.4 MG SL tablet Place 0.4 mg under the tongue every 5 (five) minutes as needed  for chest pain.   Radiology:  DG CHEST PORT 1 VIEW 10/12/2020: COMPARISON:  10/11/2020 FINDINGS: Vascular congestion with bilateral lower lung zone airspace opacities are without change. Upper lungs are clear. Suspect small effusions.  No pneumothorax. Endotracheal tube, right internal jugular central venous line and nasal/orogastric tube are stable. IMPRESSION: 1. No change from the previous day's exam. 2. Persistent vascular congestion and lower lung zone airspace opacities, the latter finding likely due to a combination of pleural fluid with atelectasis, with a possible component of either edema or infection.   Cardiac Studies:     Exercise sestamibi stress test 08/17/2016: 1. Resting EKG demonstrates normal sinus rhythm, left axis deviation, left plantar fascicular block.  Poor R-wave progression, ulnar disease pattern.  Nonspecific ST-T abnormality, cannot exclude lateral ischemia.  Stress EKG is equivocal for ischemia, patient developing atypical left otherwise for with exercise which reverted back to baseline immediately on combination of the stress test less than 60 seconds.  There was no additional ST-T wave changes of ischemia. Patient exercised on Bruce protocol for 4:25 minutes and achieved 5.45 METS. Stress test terminated due to 89 % MPHR achieved (Target HR >85%). Symptoms included dizziness.  2.  2-Day protocol followed. Perfusion imaging studies demonstrate large sized severe perfusion defect involving the inferior, anterior and anterolateral consistent with inferior wall scar with moderate peri-infarct ischemia and severe anterior and anteroapical reversible ischemia extending from the base towards the apex.  Left ventricular systolic function calculating by QGS was markedly depressed at 26%.  This is a high risk study, consider further cardiac work-up.   Carotid artery duplex 02/16/2018: No hemodynamically significant arterial disease in the internal carotid artery bilaterally. Mild  heterogenous plaque noted.  Antegrade right vertebral artery flow. Antegrade left vertebral artery flow. Compared to the study done on 09/01/2016, left carotid noted as occlusion is an error. This may perhaps be due to low velocity noted in both carotid arteries and may indicate low systemic BP or reduced cardiac output. Clinical correlation recommended.  Peripheral arteriogram 02/28/2019: Pelvic aortogram with limited bifemoral arteriogram revealed patent distal abdominal aorta without aneurysm, patent, bilateral iliac and common femoral vessels. Right SFA is occluded in the ostium and reconstitutes outside of the Hunter's canal.  Below the right knee there is two-vessel runoff, AT is occluded.  Left leg was not studied beyond left common femoral artery. Successful PTA and stenting of right SFA, prolonged procedure, difficult procedure with use of multiple balloons, guidewires and catheters.  100% stenosis reduced to 0% with implantation of 3 overlapping 6.0 x 120 x2; 6.0 x 100 mm Eluvia DES. 180 mL contrast utilized.  Peripheral arteriogram 05/02/2019: Right common femoral artery and right proximal SFA previously placed stent widely patent.  Right iliac artery shows mild disease. Left iliac artery and left femoral arteries show very mild disease.  There is two-vessel runoff in the left leg, left AT is occluded. Intermediate stenosis noted in the left distal and proximal SFA, pressure pullback reveals no significant gradient.   ABI 08/09/2019: This exam reveals normal perfusion of the right lower extremity (ABI). This exam reveals normal perfusion of the left lower extremity (ABI).  The ABI may be falsely elevated due to medial calcinosis from diabetes. No significant change since 05/11/2019. Patient has h/o right SFA stenting.   Left Heart Catheterization 10/11/20:  LV: Severely dilated.  Global hypokinesis.  Hand contrast injection hence not fully adequately visualized.  LVEF 15 to 20%.  EDP  markedly elevated at 29 mmHg.  No pressure gradient across the aortic valve.  Left main: Normal. LAD: Severely diffusely diseased.  Gives origin to large D1 which gives collaterals to the LAD and 2 smaller diagonals, LAD is occluded after the origin of D1, anatomy compared to prior in 2017 reveals progression of diffuse disease.  There are ipsilateral and contralateral collaterals to the LAD. CX: Moderate sized vessel, giving origin to large OM1.  OM1 is occluded in the ostium as was noted previously and has ipsilateral collaterals.  OM1 is diffusely diseased and faintly filled. RCA: Moderate disease in the proximal and mid segment.  At the bifurcation of PDA and PL, there is a high-grade 90% stenosis.  The bifurcation is also involved with at least a 90% stenosis in the PDA and a 60 to 70% stenosis in the PL branch.  There is no target as distally as the PL branch which is large is occluded distally and that was new from 2017.  There are faint collaterals noted from the left to the RCA.   Impression: Severe native vessel three-vessel coronary artery disease with no significant targets for revascularization, LAD may be amenable for revascularization but I am not sure that this would help, anatomy similar to 2017 but progression to diffuse disease.  Patient is extremely ill with high risk for mortality.  He also has underlying stage III-IV kidney disease and contrast nephropathy needs to be evaluated further in view of contrast load of 70 mL.  Echocardiogram 12/29/2020: 1. Left ventricular ejection fraction, by estimation, is <20%. The left ventricle has severely decreased function. The left ventricle demonstrates regional wall motion abnormalities (see scoring diagram/findings for description). The left ventricular internal cavity size was moderately dilated. There is mild left ventricular hypertrophy. Left ventricular diastolic parameters are consistent with Grade II diastolic dysfunction  (pseudonormalization). Elevated left atrial pressure. Akinetic inferolateral  and apical myocardium. Moderate anteroseptal hypokinesis.   2. Right ventricular systolic function is low normal. The right ventricular size is normal.   3. The mitral valve is grossly normal. Mild to moderate mitral valve regurgitation.   4. The aortic valve is tricuspid. Aortic valve regurgitation is not visualized.   5. The inferior vena cava is dilated in size with <50% respiratory variability, suggesting right atrial pressure of 15 mmHg.   6. Compared to previous study in 10/2020, LVEF has further decreased from 20-25% to 15-20%.  Coronary Angioplasty 12/31/20 Impella support high risk intervention  Unsuccessful attempt at proximal LAD CTO intervention in spite of use of support catheters and multiple wires. 129 ml contrast used. No immediate complications.   Continue guideline directed medical therapy for heart failure discuss regarding any future attempts of revascularization.   EKG   EKG 08/11/2020: Sinus tachycardia at rate of 120 bpm, atypical left bundle branch block.  No further analysis.   EKG 09/13/2020: Normal sinus rhythm at rate of 91 bpm, left atrial enlargement, inferior infarct old.  Anteroseptal infarct old.  Nonspecific T abnormality.  Compared to 08/11/2020, left bundle branch block not present.  EKG 04/19/2020: Normal sinus rhythm at rate of 61 bpm, inferior infarct old.  Anteroseptal infarct old.  Nonspecific T abnormality.  Borderline low voltage complexes.  No significant change from 11/08/2019.   Assessment     ICD-10-CM   1. Chronic systolic heart failure (HCC)  G31.51 Basic metabolic panel    Basic metabolic panel    2. Ischemic cardiomyopathy  V61.6 Basic metabolic panel    Basic metabolic panel    3. Hyperkalemia, diminished renal excretion  E87.5  Meds ordered this encounter  Medications   nitroGLYCERIN (NITROSTAT) 0.4 MG SL tablet    Sig: Place 1 tablet (0.4 mg total)  under the tongue every 5 (five) minutes as needed for chest pain.    Dispense:  15 tablet    Refill:  3   Medications Discontinued During This Encounter  Medication Reason   spironolactone (ALDACTONE) 25 MG tablet Error   potassium chloride SA (KLOR-CON) 20 MEQ tablet Error   nitroGLYCERIN (NITROSTAT) 0.4 MG SL tablet Reorder       Recommendations:    Eduardo [name]  is a 65 y.o. Caucasian male  with controlled type 2 diabetes, hypertension, hyperlipidemia, coronary artery disease by angiography on 09/08/2016 revealing severe triple-vessel CAD with no targets for CABG, he presented with new onset left bundle branch block and acute fulminant pulmonary edema needing intubation on 10/11/2020, emergent cardiac catheterization essentially revealing progression of severe native vessel disease with no significant option for revascularization.  Although LAD was felt to be amenable for potential revascularization, it was unchanged from prior cardiac catheterization and RCA was felt to be the culprit, in view of diffuse disease medical management was recommended.  Past medical history is also significant for morbid obesity, diabetes mellitus with stage III AV chronic kidney disease, mild carotid atherosclerosis, right SFA occlusion S/P complex PV angiogram on 02/28/2019 with PTA to occluded right SFA with implantation of 3 overlapping 6.0 x 120 x2; 6.0 x 100 mm Eluvia DES for critical right leg ischemia now resolved. 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 He has mild disease in the left leg.  PAD has remained stable. Due to frequent PVCs, he was also started on amiodarone prophylactically.   Patient was again hospitalized 12/28/2020-01/02/2021 for acute on chronic systolic heart failure he diuresed well and underwent unsuccessful attempt to proximal LAD CTO intervention, was again recommended continued medical therapy.  Due to renal insufficiency while on spironolactone, this has been  discontinued.   Patient presents for 72-monthfollow-up of heart failure and hypokalemia.  At last visit no changes were made, however discussed that if potassium is >5.5 could consider restarting Lokelma if necessary.  BMP 2 days ago on 05/05/2021 revealed potassium 5.3.  Patient is doing quite well despite severe systolic dysfunction.  He continues to enjoy fishing and walking on a daily basis.  He is tolerating guideline directed medical therapy well and remains well compensated.  We will continue aspirin and Plavix, carvedilol, Jardiance, Entresto, rosuvastatin.  Patient's potassium was mildly elevated at 5.5, will therefore repeat BMP in 2 weeks and continue to monitor closely.  If potassium is >5.5, will consider restarting Will, if necessary.  Reviewed and discussed with patient CBC and BMP, details above.  CBC was within normal limits and revealed that platelets had normalized.  Patient continues to prefer medical management of CAD and heart failure as opposed to considering CAD and/or palliative care consult.  Follow-up in 3 months, sooner if needed, for heart failure and CAD.   CAlethia Berthold PA-C 05/08/2021, 12:59 PM Office: [phone] CC: EMadelon Lips MD

## 2021-05-08 ENCOUNTER — Ambulatory Visit: Payer: Medicare Other | Admitting: Student

## 2021-05-08 ENCOUNTER — Encounter: Payer: Self-pay | Admitting: Student

## 2021-05-08 ENCOUNTER — Other Ambulatory Visit: Payer: Self-pay

## 2021-05-08 VITALS — BP 121/73 | HR 74 | Temp 97.9°F | Resp 16 | Ht 70.0 in | Wt 282.0 lb

## 2021-05-08 DIAGNOSIS — E875 Hyperkalemia: Secondary | ICD-10-CM

## 2021-05-08 DIAGNOSIS — I255 Ischemic cardiomyopathy: Secondary | ICD-10-CM

## 2021-05-08 DIAGNOSIS — I5022 Chronic systolic (congestive) heart failure: Secondary | ICD-10-CM

## 2021-05-08 MED ORDER — NITROGLYCERIN 0.4 MG SL SUBL: 0.4000 mg | SUBLINGUAL_TABLET | SUBLINGUAL | 3 refills | Status: DC | PRN

## 2021-05-31 LAB — BASIC METABOLIC PANEL
BUN/Creatinine Ratio: 29 — ABNORMAL HIGH (ref 10–24)
BUN: 42 mg/dL — ABNORMAL HIGH (ref 8–27)
CO2: 22 mmol/L (ref 20–29)
Calcium: 9.4 mg/dL (ref 8.6–10.2)
Chloride: 105 mmol/L (ref 96–106)
Creatinine, Ser: 1.45 mg/dL — ABNORMAL HIGH (ref 0.76–1.27)
Glucose: 111 mg/dL — ABNORMAL HIGH (ref 65–99)
Potassium: 5.1 mmol/L (ref 3.5–5.2)
Sodium: 146 mmol/L — ABNORMAL HIGH (ref 134–144)
eGFR: 53 mL/min/{1.73_m2} — ABNORMAL LOW (ref >59)

## 2021-06-05 LAB — MICROALBUMIN, URINE: Microalb, Ur: 22.5

## 2021-06-25 NOTE — Patient Instructions (Addendum)
  HEALTH MAINTENANCE RECOMMENDATIONS:  It is recommended that you get at least 30 minutes of aerobic exercise at least 5 days/week (for weight loss, you may need as much as 60-90 minutes). This can be any activity that gets your heart rate up. This can be divided in 10-15 minute intervals if needed, but try and build up your endurance at least once a week.  Weight bearing exercise is also recommended twice weekly.  Eat a healthy diet with lots of vegetables, fruits and fiber.  "Colorful" foods have a lot of vitamins (ie green vegetables, tomatoes, red peppers, etc).  Limit sweet tea, regular sodas and alcoholic beverages, all of which has a lot of calories and sugar.  Up to 2 alcoholic drinks daily may be beneficial for men (unless trying to lose weight, watch sugars).  Drink a lot of water.  Sunscreen of at least SPF 30 should be used on all sun-exposed parts of the skin when outside between the hours of 10 am and 4 pm (not just when at beach or pool, but even with exercise, golf, tennis, and yard work!)  Use a sunscreen that says "broad spectrum" so it covers both UVA and UVB rays, and make sure to reapply every 1-2 hours.  Remember to change the batteries in your smoke detectors when changing your clock times in the spring and fall.  Carbon monoxide detectors are recommended for your home.  Use your seat belt every time you are in a car, and please drive safely and not be distracted with cell phones and texting while driving.   [name] , Thank you for taking time to come for your Welcome to Medicare Visit. I appreciate your ongoing commitment to your health goals. Please review the following plan we discussed and let me know if I can assist you in the future.   This is a list of the screening recommended for you and due dates:  Health Maintenance  Topic Date Due   Eye exam for diabetics  09/09/2018   COVID-19 Vaccine (3 - Booster for Pfizer series) 07/30/2020   Pneumonia vaccines (2 of 2 -  PPSV23) 10/06/2020   Flu Shot  06/09/2021   Complete foot exam   07/09/2021   Hemoglobin A1C  09/19/2021   Tetanus Vaccine  09/27/2021   Colon Cancer Screening  11/22/2024   Hepatitis C Screening: USPSTF Recommendation to screen - Ages 18-79 yo.  Completed   HIV Screening  Completed   Zoster (Shingles) Vaccine  Completed   HPV Vaccine  Aged Out   Pneumovax was given today. Colon cancer screening I believe was due in 2021 (5 year follow-up, no 2026 as stated above).  We discussed this today, at high risk for procedure, not recommended unless symptoms.  Please get high dose flu shot in September (at least 2 weeks from today's vaccine). Now that you are 64, get the high dose type.  You will need to get your tetanus booster (TdaP) from the pharmacy (due in November)  Keep an eye out for the news for when the new COVID booster with better variant coverage will be available (should be late Fall).  Please let us know the dates of any boosters that you have had (we only have your first 2 in the chart).  Continue Diabetic eye exams  (we haven't been getting these reports to have it show up in the system).  Please see podiatrist regularly and let them manage your callouses.

## 2021-06-25 NOTE — Progress Notes (Signed)
Chief Complaint  Patient presents with   Annual Exam    Welcome to medicare no other issues Dr. Zigmund Daniel did his diabetic eye exam,     Eduardo [name] is a 65 y.o. male who presents for Welcome to Medicare visit.  He is now retired and enjoys fishing.  He is under the care of  Dr. Chalmers Cater for diabetes. Last note was from 11/2020, A1c was 6.7%.  More recently A1c was 6.5% in May. He reports he saw her in May, and will see again in October. He was started on Jardiance after MI in 10/2020. He has been tapering down on insulin (Humalin 30/15, and SSI humalog). He has been losing weight and watching his diet very carefully. He has CGM, gets alarms from hypoglycemia, which is why dose is being tapered.  Only occasional high or low, usually running 112-140 in mornings, 92-150 in afternoon, 120's in the evenings. Denies polydipsia, urinary frequency only due to diuretic. Under the care of Dr. Zigmund Daniel for diabetic retinopathy, seems him every 6 months. Hasn't needing any further injections (edema improving) since December. No longer seeing Dr. Peter Garter. He has peripheral neuropathy with numbness in his feet, not painful.  Last saw Dr. Jacqualyn Posey at [facility] in 12/2020.  He has been managing his own callouses, recently caused some bleeding when he did this (bandage on today). 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 Sharol Given on 05/19/2019 for ulcer, osteomyelitis.    Lab Results  Component Value Date   HGBA1C 6.5 03/19/2021   He has 3V CAD, under the care of Dr. Einar Gip.  Last seen in 04/2021.  Cath in 08/2016 showed severe 3V CAD, no targets for CABG, on aggressive medical therapy.  He had new onset LBBB and acute fulminant pulmonary edema needing intubation on 10/11/2020. Emergent cardiac catheterization showed progression of severe native vessel disease, no significant option for revascularization (LAD was potentially amenable but RCA was felt to be the culprit). He is continues with medical management per cardiologists. He was  hospitalized in [id number] with systolic heart failure (due to ischemic cardiomyopathy). Attempted PCI to LAD CTO, unsuccessful.  EF <20% in 12/2020. Decided against VAD, heart transplant. No longer seeing Dr. Haroldine Laws. Torsemide and spirnolactone were started, the latter was stopped due to elevated K+. Continues on torsemide.  He has taken [facility] for hyperkalemia in the past, not currently needing. Still has some at home. He denies chest pain, DOE, syncope, orthopnea.  No longer having any edema. No medication changes were made, continues on aspirin and Plavix, carvedilol, Jardiance, Entresto, rosuvastatin.   PAD, under the care of Dr. Einar Gip.  S/p R stent 02/2019.  He denies claudication.   He is s/p R 5th ray amputation 05/2019 by Dr. Sharol Given. He denies any further ulcers.  Stage 3 CKD--under the care of Dr. Hollie Salk. Last seen 06/05/21. On Jardiance, torsemide, Entresto. Losartan was stopped 10/2020, spironolactone stopped 01/2021 (hyperkalemia).  Potassium was normal on last check (has had some elevated K+), not currently taking Lokelma. Creat had been in 1.79 range in March and June, repeat in July was improved, with Cr 1.45 and GFR 53 (up from 42). He is not interested in dialysis if comes to that (was told he can't do HD, doesn't want to do peritoneal dialysis).  Obesity: He started at [employer] January, 2018; initial weight was 336#. He is still on [employer], and really watches his food choices and portion control.  HTN--he doesn't check BP's elsewhere, but checked by all  his other doctors, always okay.  Denies dizziness, headaches, no longer having edema. BP Readings from Last 3 Encounters:  05/08/21 121/73  02/06/21 109/62  01/22/21 110/70   Mixed hyperlipidemia:  He is compliant with gemfibrozil '600mg'$  BID, fish oil once daily and crestor '20mg'$ .  He is following a lowfat, low cholesterol diet. Due for check today.  Dupuytren's contractures--Can't straighten right pinkie or left ring finger.  There is  some discomfort/aching, relieved by aspercreme or Voltaren gel.  Prior injection with Dr. Lenon Curt wasn't beneficial (only slight improvement, temporarily). Not bothersome, he is able to fish.  He uses some topical medications when it is uncomfortable.  Erectile dysfunction:  Not on meds (due to CAD, nitrates).  Offered referral to urologist in the past to discuss other options, but he declined. He is not currently in a sexual relationshp   Immunization History  Administered Date(s) Administered   Influenza Split 08/10/2011, 08/19/2020   Influenza,inj,Quad PF,6+ Mos 10/15/2016, 10/15/2017, 08/09/2018   PFIZER(Purple Top)SARS-COV-2 Vaccination 01/31/2020, 02/28/2020   Pneumococcal Conjugate-13 05/25/2017   Pneumococcal Polysaccharide-23 09/28/2011   Td 06/03/2005   Tdap 09/28/2011   Zoster Recombinat (Shingrix) 06/06/2018, 08/09/2018   He states he got a booster. Last colonoscopy: 11/2014, Dr.Magod-ext and int hemorrhoids and diverticulosis. (that was a 3 yr f/u, h/o polyps in past).  Was due again 2021. He called Dr. Watt Climes but never got a response. Last PSA:  appears to be rising by 0.5 per year Lab Results  Component Value Date   PSA1 3.8 06/24/2020   PSA1 3.3 06/12/2019   PSA1 2.8 06/06/2018   PSA 2.3 05/25/2017   PSA 1.09 09/28/2011   Dentist: twice yearly Ophtho: regularly (due to retinopathy). Hasn't seen Dr. Peter Garter in 2 years, just seeing Dr. Zigmund Daniel 2x/year. He states that pressure is checked at each visit. Exercise: Peloton bike 3x/week for 20 minutes. Does some weights with it 2x/week.  He is also walking 2 miles/day.  Depression screen:  negative Fall Screen: None Functional Status Survey: unremarkable Mini-Cog: Normal See Epic for full questionnaires/screens  End of Life Discussion:  Patient has a living will.  Needs to re-do HCPOA, given new paperwork Patient has DNR (dated 10/2020) Willing to be intubated and go to ICU, but DNR if arrests. No longterm use of life  support.  Doctors caring for patient:   Endo: Dr. Chalmers Cater Cardiology: Dr. Einar Gip, [place] Nephro: Dr. Hollie Salk Derm: Dr. Renda Rolls GI: Dr. Watt Climes Ophtho: Dr. Zigmund Daniel, Dr. Peter Garter (previous Dr. Herbert Deaner, did cataract surgery) Podiatry: Dr. Jacqualyn Posey  Dentist: Dr. Cephus Slater at [facility]   [facility], [place], [facility] and [facility] were reviewed and updated.  Outpatient Encounter Medications as of 06/26/2021  Medication Sig   acetaminophen (TYLENOL) 325 MG tablet Take 650 mg by mouth every 6 (six) hours as needed for mild pain (or headaches).   aspirin 81 MG chewable tablet Chew 1 tablet (81 mg total) by mouth daily.   B-D UF III MINI PEN NEEDLES 31G X 5 MM MISC 6 (six) times daily.   Bioflavonoid Products (ESTER C PO) Take 1,000 mg by mouth daily.   carvedilol (COREG) 3.125 MG tablet Take 1 tablet (3.125 mg total) by mouth 2 (two) times daily with a meal.   Cholecalciferol (VITAMIN D) 2000 units CAPS Take 2,000 Units by mouth daily.   clopidogrel (PLAVIX) 75 MG tablet TAKE 1 TABLET BY MOUTH  DAILY   Continuous Blood Gluc Sensor (FREESTYLE LIBRE 14 DAY SENSOR) MISC every 14 (fourteen) days.   empagliflozin (  JARDIANCE) 10 MG TABS tablet Take 1 tablet (10 mg total) by mouth daily.   gemfibrozil (LOPID) 600 MG tablet Take 600 mg by mouth 2 (two) times daily before a meal.   HUMALOG KWIKPEN 100 UNIT/ML KiwkPen Inject 0-10 Units into the skin See admin instructions. Inject 0-10 units into the skin three times a day, per sliding scale- based on BGL >100   HUMULIN N KWIKPEN 100 UNIT/ML Kiwkpen Inject 30 Units into the skin See admin instructions. Inject 30 units into the skin before breakfast and 15 U before supper   isosorbide mononitrate (IMDUR) 60 MG 24 hr tablet Take 1 tablet (60 mg total) by mouth daily.   Multiple Vitamin (MULTIVITAMIN WITH MINERALS) TABS tablet Take 1 tablet by mouth daily.   Omega-3 Fatty Acids (FISH OIL PO) Take 350 mg by mouth daily with breakfast.   Probiotic Product (PROBIOTIC-10  PO) Take 1 capsule by mouth daily.   rosuvastatin (CRESTOR) 20 MG tablet Take 1 tablet (20 mg total) by mouth daily.   sacubitril-valsartan (ENTRESTO) 24-26 MG Take 1 tablet by mouth 2 (two) times daily.   torsemide (DEMADEX) 20 MG tablet Take 1 tablet (20 mg total) by mouth 2 (two) times daily at 10 am and 4 pm.   nitroGLYCERIN (NITROSTAT) 0.4 MG SL tablet Place 1 tablet (0.4 mg total) under the tongue every 5 (five) minutes as needed for chest pain. (Patient not taking: Reported on 06/26/2021)   No facility-administered encounter medications on file as of 06/26/2021.   Allergies  Allergen Reactions   Shellfish-Derived Products Nausea And Vomiting and Other (See Comments)    Mussels cause severe nausea and vomiting    Latex Rash   Tape Rash and Other (See Comments)    Caused issues with the skin   Testosterone Rash    ROS:  The patient denies anorexia, fever, headaches, decreased hearing, ear pain, hoarseness, chest pain, palpitations, dizziness, syncope, dyspnea on exertion, cough, swelling, nausea, vomiting, diarrhea, constipation, abdominal pain, melena, hematochezia, indigestion/heartburn, hematuria, incontinence, nocturia, dysuria, genital lesions, joint pains, weakness, tremor, suspicious skin lesions, depression, anxiety, abnormal bleeding/bruising, or enlarged lymph nodes. Denies significant vision changes. Rare tinnitus Erectile dysfunction Numbness in feet, unchanged, not painful. Denies any bruising. If he bangs his legs, it will bleed a lot (due to medications).    PHYSICAL EXAM:  BP 124/74   Pulse 77   Temp 97.9 F (36.6 C)   Ht 5' 9.5" (1.765 m)   Wt 279 lb (126.6 kg)   BMI 40.61 kg/m   Wt Readings from Last 3 Encounters:  06/26/21 279 lb (126.6 kg)  05/08/21 282 lb (127.9 kg)  02/06/21 284 lb 9.6 oz (129.1 kg)   317# 12.8 oz at CPE 06/2020  General Appearance:    Alert, cooperative, no distress, appears stated age. Head is bald/shaven  Head:     Normocephalic, without obvious abnormality, atraumatic  Eyes:    PERRL, conjunctiva/corneas clear, EOM's intact, fundi not visualized  Ears:    Normal TM's and external ear canals  Nose:   Not examined (wearing mask due to COVID-19 pandemic)  Throat:   Not examined (wearing mask due to COVID-19 pandemic)  Neck:   Supple, no lymphadenopathy;  thyroid:  no enlargement/ tenderness/nodules; no carotid bruit or JVD  Back:    Spine nontender, no curvature, ROM normal, no CVA   tenderness  Lungs:     Clear to auscultation bilaterally without wheezes, rales or ronchi; respirations unlabored  Chest Wall:  No tenderness or deformity   Heart:    Regular rate and rhythm, S1 and S2 normal, no murmur, rub or gallop  Breast Exam:    No chest wall tenderness, or masses   Abdomen:     Soft, non-tender, obese; nondistended, normoactive bowel sounds, no masses, no hepatosplenomegaly  Genitalia:    Normal male external genitalia without lesions.  Testicles without masses.  No inguinal hernias.  Rectal:    Normal sphincter tone, no masses or tenderness; guaiac negative stool.  Prostate smooth, no nodules, mildly enlarged.  Extremities:  No clubbing, cyanosis or edema.  Absence of R 5th toe, WHSS. Bandage in place at R lateral foot (he reportedly picked at callous this morning and it bled). Superficial veins, especially medially at knees and ankles, L>R, nontender  Pulses:   Palpable distal pulses in LE's, diminished somewhat on the left.  Feet are warm  Skin:   Turgor normal, no suspicious lesions. Bruising on abdomen from insulin injections  Lymph nodes:   Cervical, supraclavicular, and inguinal lymph nodes normal  Neurologic:    Normal strength, gait; reflexes 2+ and symmetric throughout.                                Psych:   Normal mood, affect, hygiene and grooming.   ASSESSMENT/PLAN:  Welcome to Medicare preventive visit - Plan: POCT URINALYSIS DIP (CLINITEK)  Chronic systolic heart failure (HCC) -  stable on current regimen.  No edema, no DOE.  Doing surprisingly well for EF<20% per last echo  Ischemic cardiomyopathy - doing well on current regimen.  Cont regular f/u with Dr. Einar Gip  3-vessel CAD  PAD (peripheral artery disease) ([place]) - Denies claudication. Cont current regimen. Encouraged regular podiatrist visits, not to manage callouses on his own  Stage 3a chronic kidney disease ([place]) - stable/improved.  under care of nephro  Diabetic retinopathy associated with type 2 diabetes mellitus, macular edema presence unspecified, unspecified laterality, unspecified retinopathy severity ([place]) - DM well controlled, tapering insulin per endo. Under care of ophtho. Rec routine podiatry visits.  Hypertension associated with diabetes ([place]) - well controlled on current regimen  Type 2 diabetes mellitus with nephropathy (HCC)  Partial nontraumatic amputation of foot, right ([place])  Mixed hyperlipidemia - recheck lipids. If TG normal, can consider cutting back on gemfibrozil. Will forward lipids to Dr. Einar Gip - Plan: Lipid panel  Screening for prostate cancer - exam c/w mild BPH.  Discussed risks/benefits of PSA, and decided against screening   Need for pneumococcal vaccination - Plan: Pneumococcal polysaccharide vaccine 23-valent greater than or equal to 2yo subcutaneous/IM  Will send copy of lipids to Dr. Einar Gip (who prescribes his meds). If TG normal (since DM better controlled), can consider stopping gemfibrozil   Discussed PSA screening (risks/benefits) and we both agreed screening not indicated at this time (based on life expectancy/comorbidities), recommended at least 30 minutes of aerobic activity at least 5 days/week; proper sunscreen use reviewed; healthy diet and alcohol recommendations (less than or equal to 2 drinks/day) reviewed; regular seatbelt use; changing batteries in smoke detectors. Immunization recommendations discussed--continue yearly flu shots, now high dose.  Pneumovax  given today.  Tdap due from pharmacy in 09/2021.  Encouraged him to get updated COVID booster when available. Colonoscopy recommendations reviewed, was due 11/2019.  Not a good candidate.  Adam to request diabetic eye exam from Dr. Zigmund Daniel.  MOST form completed, DNR, may intubate, no prolonged measures.  New paperwork given for Living Will and Healthcare POA (needs to re-do the latter).   Medicare Attestation I have personally reviewed: The patient's medical and social history Their use of alcohol, tobacco or illicit drugs Their current medications and supplements The patient's functional ability including ADLs,fall risks, home safety risks, cognitive, and hearing and visual impairment Diet and physical activities Evidence for depression or mood disorders  The patient's weight, height, BMI have been recorded in the chart.  I have made referrals, counseling, and provided education to the patient based on review of the above and I have provided the patient with a written personalized care plan for preventive services.     Vikki Ports, MD

## 2021-06-26 ENCOUNTER — Other Ambulatory Visit: Payer: Self-pay

## 2021-06-26 ENCOUNTER — Ambulatory Visit (INDEPENDENT_AMBULATORY_CARE_PROVIDER_SITE_OTHER): Payer: Medicare Other | Admitting: Family Medicine

## 2021-06-26 ENCOUNTER — Encounter: Payer: Self-pay | Admitting: Family Medicine

## 2021-06-26 VITALS — BP 124/74 | HR 77 | Temp 97.9°F | Ht 69.5 in | Wt 279.0 lb

## 2021-06-26 DIAGNOSIS — E11319 Type 2 diabetes mellitus with unspecified diabetic retinopathy without macular edema: Secondary | ICD-10-CM

## 2021-06-26 DIAGNOSIS — E1159 Type 2 diabetes mellitus with other circulatory complications: Secondary | ICD-10-CM

## 2021-06-26 DIAGNOSIS — I739 Peripheral vascular disease, unspecified: Secondary | ICD-10-CM | POA: Diagnosis not present

## 2021-06-26 DIAGNOSIS — Z125 Encounter for screening for malignant neoplasm of prostate: Secondary | ICD-10-CM | POA: Diagnosis not present

## 2021-06-26 DIAGNOSIS — I5022 Chronic systolic (congestive) heart failure: Secondary | ICD-10-CM

## 2021-06-26 DIAGNOSIS — I2510 Atherosclerotic heart disease of native coronary artery without angina pectoris: Secondary | ICD-10-CM

## 2021-06-26 DIAGNOSIS — I152 Hypertension secondary to endocrine disorders: Secondary | ICD-10-CM

## 2021-06-26 DIAGNOSIS — I255 Ischemic cardiomyopathy: Secondary | ICD-10-CM

## 2021-06-26 DIAGNOSIS — E782 Mixed hyperlipidemia: Secondary | ICD-10-CM

## 2021-06-26 DIAGNOSIS — Z23 Encounter for immunization: Secondary | ICD-10-CM

## 2021-06-26 DIAGNOSIS — N1831 Chronic kidney disease, stage 3a: Secondary | ICD-10-CM

## 2021-06-26 DIAGNOSIS — Z0000 Encounter for general adult medical examination without abnormal findings: Secondary | ICD-10-CM | POA: Diagnosis not present

## 2021-06-26 DIAGNOSIS — Z89431 Acquired absence of right foot: Secondary | ICD-10-CM

## 2021-06-26 DIAGNOSIS — E1121 Type 2 diabetes mellitus with diabetic nephropathy: Secondary | ICD-10-CM

## 2021-06-26 LAB — POCT URINALYSIS DIP (CLINITEK)
Bilirubin, UA: NEGATIVE
Blood, UA: NEGATIVE
Glucose, UA: 500 mg/dL — AB
Ketones, POC UA: NEGATIVE mg/dL
Leukocytes, UA: NEGATIVE
Nitrite, UA: NEGATIVE
POC PROTEIN,UA: NEGATIVE
Spec Grav, UA: 1.015 (ref 1.010–1.025)
Urobilinogen, UA: 0.2 E.U./dL
pH, UA: 6 (ref 5.0–8.0)

## 2021-06-26 LAB — LIPID PANEL
Chol/HDL Ratio: 3.9 ratio (ref 0.0–5.0)
Cholesterol, Total: 138 mg/dL (ref 100–199)
HDL: 35 mg/dL — ABNORMAL LOW (ref >39)
LDL Chol Calc (NIH): 75 mg/dL (ref 0–99)
Triglycerides: 159 mg/dL — ABNORMAL HIGH (ref 0–149)
VLDL Cholesterol Cal: 28 mg/dL (ref 5–40)

## 2021-06-27 ENCOUNTER — Other Ambulatory Visit: Payer: Self-pay | Admitting: Student

## 2021-06-27 DIAGNOSIS — I2510 Atherosclerotic heart disease of native coronary artery without angina pectoris: Secondary | ICD-10-CM

## 2021-06-27 MED ORDER — EZETIMIBE 10 MG PO TABS: 10.0000 mg | ORAL_TABLET | Freq: Every day | ORAL | 11 refills | Status: DC

## 2021-06-27 NOTE — Progress Notes (Signed)
Sure we will address this. Urine sugar due to Jardiance. Hope he will loose some weight

## 2021-06-27 NOTE — Progress Notes (Signed)
LDL remains above goal at 75, goal is less than 55.  We will add Zetia 10 mg daily and repeat lipid profile testing in 3 months.

## 2021-06-27 NOTE — Progress Notes (Signed)
Spoke with patient have added Zetia and will plan to repeat lipid profile testing.  Also again advised regarding dietary changes and weight loss.

## 2021-07-18 ENCOUNTER — Encounter: Payer: Self-pay | Admitting: Family Medicine

## 2021-08-06 ENCOUNTER — Ambulatory Visit (INDEPENDENT_AMBULATORY_CARE_PROVIDER_SITE_OTHER): Payer: Medicare Other

## 2021-08-06 ENCOUNTER — Ambulatory Visit: Payer: Medicare Other | Admitting: Podiatry

## 2021-08-06 ENCOUNTER — Other Ambulatory Visit: Payer: Self-pay

## 2021-08-06 DIAGNOSIS — L97501 Non-pressure chronic ulcer of other part of unspecified foot limited to breakdown of skin: Secondary | ICD-10-CM

## 2021-08-06 DIAGNOSIS — L97512 Non-pressure chronic ulcer of other part of right foot with fat layer exposed: Secondary | ICD-10-CM

## 2021-08-06 DIAGNOSIS — I739 Peripheral vascular disease, unspecified: Secondary | ICD-10-CM | POA: Diagnosis not present

## 2021-08-06 MED ORDER — DOXYCYCLINE HYCLATE 100 MG PO TABS: 100.0000 mg | ORAL_TABLET | Freq: Two times a day (BID) | ORAL | 0 refills | Status: DC

## 2021-08-08 ENCOUNTER — Telehealth: Payer: Self-pay | Admitting: *Deleted

## 2021-08-08 NOTE — Progress Notes (Signed)
Subjective: 65 year old male presents the office today for concerns of recurrent wound on the right foot.  He states that after I last saw him the wound did heal he had a callus that he peeled off the callus himself likely causing the wound.  Has been keeping Betadine on the area.  He has had some bloody drainage when he puts pressure on the foot but no pus.  No increase in swelling or redness to the foot.  No fevers or chills.  No other concerns.  Objective: AAO x3, NAD DP/PT pulses 1/4 bilaterally, CRT less than 3 seconds Plantar aspect of right foot just proximal to the metatarsal head laterally is hyperkeratotic lesion with dried blood.  Upon debridement it is preulcerative and the partial-thickness wound is present.  Some macerated tissue is present underneath the callus.  No surrounding erythema, ascending cellulitis.  No fluctuation crepitation but there is no malodor. No pain with calf compression, swelling, warmth, erythema  Assessment: Ulceration right foot  Plan: -All treatment options discussed with the patient including all alternatives, risks, complications.  -Debrided the hyperkeratotic tissue today without any complications or bleeding.  Recommended Betadine to the area daily and offloading.  I would order a new ABI since it has been sometime and there is a wound present.  We will send orders over to Dr. Irven Shelling office for this.  -Doxycycline -Monitor for any clinical signs or symptoms of infection and directed to call the office immediately should any occur or go to the ER. -Patient encouraged to call the office with any questions, concerns, change in symptoms.   Trula Slade DPM

## 2021-08-08 NOTE — Telephone Encounter (Signed)
Faxed order to [facility] 08/08/21,received confirmation.

## 2021-08-08 NOTE — Telephone Encounter (Signed)
-----   Message from Trula Slade, DPM sent at 08/08/2021  1:54 PM EDT ----- I ordered a ABI for this patient but is seen at [place] on [facility] Can someone please fax over the order? Thanks.

## 2021-08-09 ENCOUNTER — Other Ambulatory Visit: Payer: Self-pay | Admitting: Cardiology

## 2021-08-09 DIAGNOSIS — L97209 Non-pressure chronic ulcer of unspecified calf with unspecified severity: Secondary | ICD-10-CM

## 2021-08-09 DIAGNOSIS — I7025 Atherosclerosis of native arteries of other extremities with ulceration: Secondary | ICD-10-CM

## 2021-08-09 DIAGNOSIS — E1151 Type 2 diabetes mellitus with diabetic peripheral angiopathy without gangrene: Secondary | ICD-10-CM

## 2021-08-09 NOTE — Progress Notes (Signed)
ICD-10-CM   1. DM (diabetes mellitus), type 2 with peripheral vascular complications (HCC)  P94.32 PCV LOWER ARTERIAL (BILATERAL)    2. 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

## 2021-08-11 ENCOUNTER — Telehealth: Payer: Self-pay | Admitting: *Deleted

## 2021-08-11 NOTE — Telephone Encounter (Signed)
Eduardo [name] w/ [facility] is calling to let the doctor know that the patient has already been  scheduled for an MRI of the entire leg on 08/14/21.The results will be in epic to view.Any questions,please call [phone].

## 2021-08-12 ENCOUNTER — Other Ambulatory Visit: Payer: Self-pay

## 2021-08-12 ENCOUNTER — Encounter (INDEPENDENT_AMBULATORY_CARE_PROVIDER_SITE_OTHER): Payer: Medicare Other | Admitting: Ophthalmology

## 2021-08-12 DIAGNOSIS — E113592 Type 2 diabetes mellitus with proliferative diabetic retinopathy without macular edema, left eye: Secondary | ICD-10-CM

## 2021-08-12 DIAGNOSIS — H26493 Other secondary cataract, bilateral: Secondary | ICD-10-CM

## 2021-08-12 DIAGNOSIS — I10 Essential (primary) hypertension: Secondary | ICD-10-CM | POA: Diagnosis not present

## 2021-08-12 DIAGNOSIS — H35033 Hypertensive retinopathy, bilateral: Secondary | ICD-10-CM

## 2021-08-12 DIAGNOSIS — E113391 Type 2 diabetes mellitus with moderate nonproliferative diabetic retinopathy without macular edema, right eye: Secondary | ICD-10-CM

## 2021-08-12 DIAGNOSIS — H43813 Vitreous degeneration, bilateral: Secondary | ICD-10-CM

## 2021-08-14 ENCOUNTER — Other Ambulatory Visit: Payer: Self-pay

## 2021-08-14 ENCOUNTER — Ambulatory Visit: Payer: Medicare Other

## 2021-08-14 DIAGNOSIS — I7025 Atherosclerosis of native arteries of other extremities with ulceration: Secondary | ICD-10-CM

## 2021-08-14 DIAGNOSIS — L97209 Non-pressure chronic ulcer of unspecified calf with unspecified severity: Secondary | ICD-10-CM

## 2021-08-14 DIAGNOSIS — E1151 Type 2 diabetes mellitus with diabetic peripheral angiopathy without gangrene: Secondary | ICD-10-CM

## 2021-08-18 ENCOUNTER — Other Ambulatory Visit: Payer: Self-pay | Admitting: Cardiology

## 2021-08-18 ENCOUNTER — Other Ambulatory Visit: Payer: Self-pay | Admitting: Student

## 2021-08-19 LAB — LIPID PANEL WITH LDL/HDL RATIO
Cholesterol, Total: 96 mg/dL — ABNORMAL LOW (ref 100–199)
HDL: 30 mg/dL — ABNORMAL LOW (ref >39)
LDL Chol Calc (NIH): 42 mg/dL (ref 0–99)
LDL/HDL Ratio: 1.4 ratio (ref 0.0–3.6)
Triglycerides: 138 mg/dL (ref 0–149)
VLDL Cholesterol Cal: 24 mg/dL (ref 5–40)

## 2021-08-19 NOTE — Progress Notes (Signed)
Primary Physician/Referring:  Rita Ohara, MD  Patient ID: Eduardo [name], male    DOB: [birth year], 65 y.o.   MRN: [id number]  Chief Complaint  Patient presents with   Congestive Heart Failure   Coronary Artery Disease   Hypertension   HPI:    ARTIST BLOOM  is a 65 y.o. Caucasian male  with controlled type 2 diabetes, hypertension, hyperlipidemia, coronary artery disease by angiography on 09/08/2016 revealing severe triple-vessel CAD with no targets for CABG, he presented with new onset left bundle branch block and acute fulminant pulmonary edema needing intubation on 10/11/2020, emergent cardiac catheterization essentially revealing progression of severe native vessel disease with no significant option for revascularization.  Although LAD was felt to be amenable for potential revascularization, it was unchanged from prior cardiac catheterization and RCA was felt to be the culprit, in view of diffuse disease medical management was recommended.  Past medical history is also significant for morbid obesity, diabetes mellitus with stage III AV chronic kidney disease, mild carotid atherosclerosis, right SFA occlusion S/P complex PV angiogram on 02/28/2019 with PTA to occluded right SFA with implantation of 3 overlapping 6.0 x 120 x2; 6.0 x 100 mm Eluvia DES for critical right leg ischemia now resolved. 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 He has mild disease in the left leg.  PAD has remained stable. Due to frequent PVCs, he was also started on amiodarone  prophylactically.   Patient was hospitalized 12/28/2020-01/02/2021 for acute on chronic systolic heart failure.  Patient was diuresed well, and given ischemic cardiomyopathy as etiology of heart failure attempted high risk PCI to LAD CTO, however it was unsuccessful. Conservative medical management was recommended, with consideration of palliative care involvement.  Patient was previously on spironolactone, however due to renal  insufficiency this has been discontinued.  Patient presents for 65-monthfollow-up of heart failure and CAD.  Patient has remained relatively stable medical management and repeat BMP showed potassium remains <5.5, therefore have held off on reinitiation of Lokelma.  Patient is feeling well and remains active.  He is tolerating Zetia without issue and lipids are well controlled.  Denies chest pain, palpitations, syncope, near syncope, orthopnea, PND.  Leg swelling is stable.   Past Medical History:  Diagnosis Date   CHF (congestive heart failure) (HCC)    Chronic kidney disease    stage 3   Colon polyp    Coronary artery disease    Diabetes mellitus 1987   under care of Dr. BChalmers Cater  On insulin since 96 (off and on)   Diabetic retinopathy    Dupuytren contracture    R hand, s/p injection (Dr. CLenon Curt   Essential hypertension, benign    Essential hypertension, benign 02/06/2019   Frequency of urination and polyuria    Hypertension    Myocardial infarction ([facility]    denies   Neuromuscular disorder ([facility]    Diabetic neuropathy   Osteomyelitis ([facility]    right foot   Other testicular hypofunction    Peripheral arterial disease ([facility] 10/28/2012   Peritoneal abscess ([facility] 6/08   and buttock.   Pneumonia    Polydipsia    Proteinuria    Pure hyperglyceridemia    Subacute osteomyelitis, right ankle and foot ([place]    Wears glasses    Past Surgical History:  Procedure Laterality Date   ABDOMINAL AORTAGRAM N/A 04/18/2012   Procedure: ABDOMINAL AMaxcine Ham  Surgeon: CAngelia Mould MD;  [facility];  Service: Cardiovascular;  Laterality: N/A;   AMPUTATION Right 05/19/2019   Procedure: RIGHT FOOT 5TH RAY AMPUTATION;  Surgeon: Newt Minion, MD;  Location: [place];  Service: Orthopedics;  Laterality: Right;   CARDIAC CATHETERIZATION N/A 09/08/2016   Procedure: Left Heart Cath and Coronary Angiography;  Surgeon: Adrian Prows, MD;  Location: [place] CV LAB;  Service:  Cardiovascular;  Laterality: N/A;   CATARACT EXTRACTION, BILATERAL  09/2017, 10/2017   Dr. Herbert Deaner   COLONOSCOPY W/ BIOPSIES AND POLYPECTOMY     CORONARY/GRAFT ACUTE MI REVASCULARIZATION N/A 10/11/2020   Procedure: Coronary/Graft Acute MI Revascularization;  Surgeon: Adrian Prows, MD;  Location: [place] CV LAB;  Service: Cardiovascular;  Laterality: N/A;   LEFT HEART CATH N/A 12/31/2020   Procedure: Left Heart Cath;  Surgeon: Adrian Prows, MD;  Location: [place] CV LAB;  Service: Cardiovascular;  Laterality: N/A;   LEFT HEART CATH AND CORONARY ANGIOGRAPHY N/A 10/11/2020   Procedure: LEFT HEART CATH AND CORONARY ANGIOGRAPHY;  Surgeon: Adrian Prows, MD;  Location: [place] CV LAB;  Service: Cardiovascular;  Laterality: N/A;   LOWER EXTREMITY ANGIOGRAM Bilateral 04/18/2012   Procedure: LOWER EXTREMITY ANGIOGRAM;  Surgeon: Angelia Mould, MD;  Location: [facility] CATH LAB;  Service: Cardiovascular;  Laterality: Bilateral;  bilat lower extrem angio   LOWER EXTREMITY ANGIOGRAPHY Bilateral 05/02/2019   Procedure: LOWER EXTREMITY ANGIOGRAPHY;  Surgeon: Adrian Prows, MD;  Location: [place] CV LAB;  Service: Cardiovascular;  Laterality: Bilateral;   LOWER EXTREMITY ANGIOGRAPHY Bilateral 02/28/2019   Procedure: LOWER EXTREMITY ANGIOGRAPHY;  Surgeon: Adrian Prows, MD;  Location: [place] CV LAB;  Service: Cardiovascular;  Laterality: Bilateral;   macular photocoagulation     (eye treatments for diabetic retinopathy)-Dr. Zigmund Daniel   PERIPHERAL VASCULAR INTERVENTION  02/28/2019   Procedure: PERIPHERAL VASCULAR INTERVENTION;  Surgeon: Adrian Prows, MD;  Location: [place] CV LAB;  Service: Cardiovascular;;   VENTRICULAR ASSIST DEVICE INSERTION N/A 12/31/2020   Procedure: VENTRICULAR ASSIST DEVICE INSERTION;  Surgeon: Adrian Prows, MD;  Location: [place] CV LAB;  Service: Cardiovascular;  Laterality: N/A;    Family History  Problem Relation Age of Onset   Diabetes Mother    Hearing loss Mother     Hypertension Mother    Hyperlipidemia Mother    Heart disease Mother    Varicose Veins Mother    Varicose Veins Father    Dementia Father    Hyperlipidemia Brother    Diabetes Maternal Grandmother    Social History   Tobacco Use   Smoking status: Former    Packs/day: 1.00    Years: 30.00    Pack years: 30.00    Types: Cigarettes    Quit date: 01/08/2012    Years since quitting: 9.6   Smokeless tobacco: Never  Substance Use Topics   Alcohol use: Not Currently    Comment: rare   Marital Status: Widowed ROS  Review of Systems  Constitutional: Negative for malaise/fatigue and weight gain.  Cardiovascular:  Positive for leg swelling (chronic, stable). Negative for chest pain, claudication, near-syncope, orthopnea, palpitations, paroxysmal nocturnal dyspnea and syncope.  Respiratory:  Negative for shortness of breath.   Neurological:  Negative for dizziness.  Objective  Blood pressure (!) 123/59, pulse 60, temperature 98.2 F (36.8 C), temperature source Temporal, height 5' 9.5" (1.765 m), weight 276 lb (125.2 kg), SpO2 96 %.  Vitals with BMI 08/20/2021 06/26/2021 05/08/2021  Height 5' 9.5" 5' 9.5" 5' 10"   Weight 276 lbs 279 lbs 282 lbs  BMI 40.19 99.83 38.25  Systolic  024 097 353  Diastolic 59 74 73  Pulse 60 77 74     Physical Exam Vitals reviewed.  Constitutional:      Appearance: He is well-developed.     Comments: Morbidly obese, in no acute distress  Neck:     Thyroid: No thyromegaly.     Comments: Short neck and difficult to evaluate JVP Cardiovascular:     Pulses:          Carotid pulses are 2+ on the right side and 2+ on the left side.      Popliteal pulses are 0 on the right side and 2+ on the left side.       Dorsalis pedis pulses are 1+ on the right side and 2+ on the left side.       Posterior tibial pulses are 0 on the right side and 1+ on the left side.     Heart sounds: No murmur heard.   No gallop.     Comments: Femoral and popliteal pulse difficult to  feel due to patient's body habitus. Pulmonary:     Effort: Pulmonary effort is normal.     Breath sounds: No wheezing, rhonchi or rales.  Abdominal:     Comments: Obese. Pannus present  Musculoskeletal:        General: Normal range of motion.     Right lower leg: Edema (trace) present.     Left lower leg: Edema (trace) present.  Skin:    General: Skin is dry.  Neurological:     Mental Status: He is alert.  Physical exam unchanged compared to last office visit  Laboratory examination:   Recent Labs    11/28/20 0930 12/05/20 1016 12/19/20 1031 12/28/20 1108 12/31/20 0318 01/01/21 0045 01/02/21 0038 01/14/21 1150 02/03/21 1532 05/05/21 1503 05/30/21 1507  NA 147* 144 144   < > 142 136 139   < > 144 142 146*  K 4.9 4.8 4.8   < > 4.9 5.0 3.9   < > 5.2 5.3* 5.1  CL 106 103 105   < > 106 105 105   < > 104 104 105  CO2 24 23 22    < > 28 20* 24   < > 24 24 22   GLUCOSE 84 113* 159*   < > 68* 182* 46*   < > 134* 105* 111*  BUN 35* 38* 47*   < > 32* 25* 26*   < > 35* 55* 42*  CREATININE 1.55* 1.63* 1.82*   < > 1.75* 1.50* 1.47*   < > 1.77* 1.79* 1.45*  CALCIUM 9.3 8.8 9.3   < > 8.8* 8.5* 8.9   < > 9.1 9.2 9.4  GFRNONAA 46* 44* 38*   < > 43* 51* 53*  --   --   --   --   GFRAA 54* 50* 44*  --   --   --   --   --   --   --   --    < > = values in this interval not displayed.   CrCl cannot be calculated (Patient's most recent lab result is older than the maximum 21 days allowed.).  CMP Latest Ref Rng & Units 05/30/2021 05/05/2021 02/03/2021  Glucose 65 - 99 mg/dL 111(H) 105(H) 134(H)  BUN 8 - 27 mg/dL 42(H) 55(H) 35(H)  Creatinine 0.76 - 1.27 mg/dL 1.45(H) 1.79(H) 1.77(H)  Sodium 134 - 144 mmol/L 146(H) 142 144  Potassium 3.5 - 5.2 mmol/L 5.1 5.3(H)  5.2  Chloride 96 - 106 mmol/L 105 104 104  CO2 20 - 29 mmol/L 22 24 24   Calcium 8.6 - 10.2 mg/dL 9.4 9.2 9.1  Total Protein 6.5 - 8.1 g/dL - - -  Total Bilirubin 0.3 - 1.2 mg/dL - - -  Alkaline Phos 38 - 126 U/L - - -  AST 15 - 41 U/L  - - -  ALT 0 - 44 U/L - - -   CBC Latest Ref Rng & Units 05/05/2021 01/01/2021 12/28/2020  WBC 3.4 - 10.8 x10E3/uL 7.5 8.0 9.9  Hemoglobin 13.0 - 17.7 g/dL 16.8 14.9 17.2(H)  Hematocrit 37.5 - 51.0 % 50.6 45.4 52.9(H)  Platelets 150 - 450 x10E3/uL 164 136(L) 186   Lipid Panel     Component Value Date/Time   CHOL 96 (L) 08/18/2021 1538   TRIG 138 08/18/2021 1538   HDL 30 (L) 08/18/2021 1538   CHOLHDL 3.9 06/26/2021 0949   CHOLHDL 4.4 09/28/2011 1358   VLDL 21 09/28/2011 1358   LDLCALC 42 08/18/2021 1538    HEMOGLOBIN A1C Lab Results  Component Value Date   HGBA1C 6.5 03/19/2021   MPG 182.9 10/11/2020   TSH No results for input(s): TSH in the last 8760 hours.  External Labs:  Glucose Random 453.000 M 09/08/2019 BUN 38.000 M 09/08/2019 Creatinine, Serum 1.340 MG/ 09/08/2019  PSA 3.300 06/12/2019 01/19/2018: Cholesterol 115, triglycerides 112, HDL 39, LDL 54.  Glucose 125, creatinine 1.16, potassium 5.2, EGFR 67, CMP otherwise normal.  CBC normal.  TSH 1.5.  Allergies   Allergies  Allergen Reactions   Shellfish-Derived Products Nausea And Vomiting and Other (See Comments)    Mussels cause severe nausea and vomiting    Latex Rash   Tape Rash and Other (See Comments)    Caused issues with the skin   Testosterone Rash    Other reaction(s): did not feel well    Medications Prior to Visit:   Outpatient Medications Prior to Visit  Medication Sig Dispense Refill   acetaminophen (TYLENOL) 325 MG tablet Take 650 mg by mouth every 6 (six) hours as needed for mild pain (or headaches).     aspirin 81 MG chewable tablet Chew 1 tablet (81 mg total) by mouth daily.     B-D UF III MINI PEN NEEDLES 31G X 5 MM MISC 6 (six) times daily.  0   Bioflavonoid Products (ESTER C PO) Take 1,000 mg by mouth daily.     carvedilol (COREG) 3.125 MG tablet Take 1 tablet (3.125 mg total) by mouth 2 (two) times daily with a meal. 90 tablet 3   Cholecalciferol (VITAMIN D) 2000 units CAPS Take 2,000  Units by mouth daily.     clopidogrel (PLAVIX) 75 MG tablet TAKE 1 TABLET BY MOUTH  DAILY 90 tablet 1   Continuous Blood Gluc Sensor (FREESTYLE LIBRE 14 DAY SENSOR) MISC every 14 (fourteen) days.     empagliflozin (JARDIANCE) 10 MG TABS tablet Take 1 tablet (10 mg total) by mouth daily. 90 tablet 3   ENTRESTO 24-26 MG TAKE 1 TABLET BY MOUTH  TWICE DAILY 180 tablet 1   gemfibrozil (LOPID) 600 MG tablet Take 600 mg by mouth 2 (two) times daily before a meal.     HUMALOG KWIKPEN 100 UNIT/ML KiwkPen Inject 0-10 Units into the skin See admin instructions. Inject 0-10 units into the skin three times a day, per sliding scale- based on BGL >100  1   HUMULIN N KWIKPEN 100 UNIT/ML Kiwkpen Inject 30 Units  into the skin See admin instructions. Inject 30 units into the skin before breakfast and 15 U before supper  1   isosorbide mononitrate (IMDUR) 60 MG 24 hr tablet Take 1 tablet (60 mg total) by mouth daily. 90 tablet 1   Multiple Vitamin (MULTIVITAMIN WITH MINERALS) TABS tablet Take 1 tablet by mouth daily.     nitroGLYCERIN (NITROSTAT) 0.4 MG SL tablet Place 1 tablet (0.4 mg total) under the tongue every 5 (five) minutes as needed for chest pain. 15 tablet 3   Omega-3 Fatty Acids (FISH OIL PO) Take 350 mg by mouth daily with breakfast.     Probiotic Product (PROBIOTIC-10 PO) Take 1 capsule by mouth daily.     rosuvastatin (CRESTOR) 20 MG tablet Take 1 tablet (20 mg total) by mouth daily. 90 tablet 3   torsemide (DEMADEX) 20 MG tablet TAKE 1 TABLET BY MOUTH  TWICE DAILY AT 10 AM AND 4  PM 180 tablet 1   ezetimibe (ZETIA) 10 MG tablet Take 1 tablet (10 mg total) by mouth daily. 30 tablet 11   doxycycline (VIBRA-TABS) 100 MG tablet Take 1 tablet (100 mg total) by mouth 2 (two) times daily. 20 tablet 0   No facility-administered medications prior to visit.   Final Medications at End of Visit    Current Meds  Medication Sig   acetaminophen (TYLENOL) 325 MG tablet Take 650 mg by mouth every 6 (six) hours as  needed for mild pain (or headaches).   aspirin 81 MG chewable tablet Chew 1 tablet (81 mg total) by mouth daily.   B-D UF III MINI PEN NEEDLES 31G X 5 MM MISC 6 (six) times daily.   Bioflavonoid Products (ESTER C PO) Take 1,000 mg by mouth daily.   carvedilol (COREG) 3.125 MG tablet Take 1 tablet (3.125 mg total) by mouth 2 (two) times daily with a meal.   Cholecalciferol (VITAMIN D) 2000 units CAPS Take 2,000 Units by mouth daily.   clopidogrel (PLAVIX) 75 MG tablet TAKE 1 TABLET BY MOUTH  DAILY   Continuous Blood Gluc Sensor (FREESTYLE LIBRE 14 DAY SENSOR) MISC every 14 (fourteen) days.   empagliflozin (JARDIANCE) 10 MG TABS tablet Take 1 tablet (10 mg total) by mouth daily.   ENTRESTO 24-26 MG TAKE 1 TABLET BY MOUTH  TWICE DAILY   gemfibrozil (LOPID) 600 MG tablet Take 600 mg by mouth 2 (two) times daily before a meal.   HUMALOG KWIKPEN 100 UNIT/ML KiwkPen Inject 0-10 Units into the skin See admin instructions. Inject 0-10 units into the skin three times a day, per sliding scale- based on BGL >100   HUMULIN N KWIKPEN 100 UNIT/ML Kiwkpen Inject 30 Units into the skin See admin instructions. Inject 30 units into the skin before breakfast and 15 U before supper   isosorbide mononitrate (IMDUR) 60 MG 24 hr tablet Take 1 tablet (60 mg total) by mouth daily.   Multiple Vitamin (MULTIVITAMIN WITH MINERALS) TABS tablet Take 1 tablet by mouth daily.   nitroGLYCERIN (NITROSTAT) 0.4 MG SL tablet Place 1 tablet (0.4 mg total) under the tongue every 5 (five) minutes as needed for chest pain.   Omega-3 Fatty Acids (FISH OIL PO) Take 350 mg by mouth daily with breakfast.   Probiotic Product (PROBIOTIC-10 PO) Take 1 capsule by mouth daily.   rosuvastatin (CRESTOR) 20 MG tablet Take 1 tablet (20 mg total) by mouth daily.   torsemide (DEMADEX) 20 MG tablet TAKE 1 TABLET BY MOUTH  TWICE DAILY AT 10 AM  AND 4  PM   [DISCONTINUED] ezetimibe (ZETIA) 10 MG tablet Take 1 tablet (10 mg total) by mouth daily.    Radiology:   DG CHEST PORT 1 VIEW 10/12/2020: COMPARISON:  10/11/2020 FINDINGS: Vascular congestion with bilateral lower lung zone airspace opacities are without change. Upper lungs are clear. Suspect small effusions.  No pneumothorax. Endotracheal tube, right internal jugular central venous line and nasal/orogastric tube are stable. IMPRESSION: 1. No change from the previous day's exam. 2. Persistent vascular congestion and lower lung zone airspace opacities, the latter finding likely due to a combination of pleural fluid with atelectasis, with a possible component of either edema or infection.   Cardiac Studies:     Exercise sestamibi stress test 08/17/2016: 1. Resting EKG demonstrates normal sinus rhythm, left axis deviation, left plantar fascicular block.  Poor R-wave progression, ulnar disease pattern.  Nonspecific ST-T abnormality, cannot exclude lateral ischemia.  Stress EKG is equivocal for ischemia, patient developing atypical left otherwise for with exercise which reverted back to baseline immediately on combination of the stress test less than 60 seconds.  There was no additional ST-T wave changes of ischemia. Patient exercised on Bruce protocol for 4:25 minutes and achieved 5.45 METS. Stress test terminated due to 89 % MPHR achieved (Target HR >85%). Symptoms included dizziness.  2.  2-Day protocol followed. Perfusion imaging studies demonstrate large sized severe perfusion defect involving the inferior, anterior and anterolateral consistent with inferior wall scar with moderate peri-infarct ischemia and severe anterior and anteroapical reversible ischemia extending from the base towards the apex.  Left ventricular systolic function calculating by QGS was markedly depressed at 26%.  This is a high risk study, consider further cardiac work-up.   Carotid artery duplex 02/16/2018: No hemodynamically significant arterial disease in the internal carotid artery bilaterally. Mild heterogenous  plaque noted.  Antegrade right vertebral artery flow. Antegrade left vertebral artery flow. Compared to the study done on 09/01/2016, left carotid noted as occlusion is an error. This may perhaps be due to low velocity noted in both carotid arteries and may indicate low systemic BP or reduced cardiac output. Clinical correlation recommended.  Peripheral arteriogram 02/28/2019: Pelvic aortogram with limited bifemoral arteriogram revealed patent distal abdominal aorta without aneurysm, patent, bilateral iliac and common femoral vessels. Right SFA is occluded in the ostium and reconstitutes outside of the Hunter's canal.  Below the right knee there is two-vessel runoff, AT is occluded.  Left leg was not studied beyond left common femoral artery. Successful PTA and stenting of right SFA, prolonged procedure, difficult procedure with use of multiple balloons, guidewires and catheters.  100% stenosis reduced to 0% with implantation of 3 overlapping 6.0 x 120 x2; 6.0 x 100 mm Eluvia DES. 180 mL contrast utilized.  Peripheral arteriogram 05/02/2019: Right common femoral artery and right proximal SFA previously placed stent widely patent.  Right iliac artery shows mild disease. Left iliac artery and left femoral arteries show very mild disease.  There is two-vessel runoff in the left leg, left AT is occluded. Intermediate stenosis noted in the left distal and proximal SFA, pressure pullback reveals no significant gradient.   ABI 08/09/2019: This exam reveals normal perfusion of the right lower extremity (ABI). This exam reveals normal perfusion of the left lower extremity (ABI).  The ABI may be falsely elevated due to medial calcinosis from diabetes. No significant change since 05/11/2019. Patient has h/o right SFA stenting.   Left Heart Catheterization 10/11/20:  LV: Severely dilated.  Global hypokinesis.  Hand contrast  injection hence not fully adequately visualized.  LVEF 15 to 20%.  EDP markedly  elevated at 29 mmHg.  No pressure gradient across the aortic valve. Left main: Normal. LAD: Severely diffusely diseased.  Gives origin to large D1 which gives collaterals to the LAD and 2 smaller diagonals, LAD is occluded after the origin of D1, anatomy compared to prior in 2017 reveals progression of diffuse disease.  There are ipsilateral and contralateral collaterals to the LAD. CX: Moderate sized vessel, giving origin to large OM1.  OM1 is occluded in the ostium as was noted previously and has ipsilateral collaterals.  OM1 is diffusely diseased and faintly filled. RCA: Moderate disease in the proximal and mid segment.  At the bifurcation of PDA and PL, there is a high-grade 90% stenosis.  The bifurcation is also involved with at least a 90% stenosis in the PDA and a 60 to 70% stenosis in the PL branch.  There is no target as distally as the PL branch which is large is occluded distally and that was new from 2017.  There are faint collaterals noted from the left to the RCA.   Impression: Severe native vessel three-vessel coronary artery disease with no significant targets for revascularization, LAD may be amenable for revascularization but I am not sure that this would help, anatomy similar to 2017 but progression to diffuse disease.  Patient is extremely ill with high risk for mortality.  He also has underlying stage III-IV kidney disease and contrast nephropathy needs to be evaluated further in view of contrast load of 70 mL.  Echocardiogram 12/29/2020: 1. Left ventricular ejection fraction, by estimation, is <20%. The left ventricle has severely decreased function. The left ventricle demonstrates regional wall motion abnormalities (see scoring diagram/findings for description). The left ventricular internal cavity size was moderately dilated. There is mild left ventricular hypertrophy. Left ventricular diastolic parameters are consistent with Grade II diastolic dysfunction (pseudonormalization).  Elevated left atrial pressure. Akinetic inferolateral  and apical myocardium. Moderate anteroseptal hypokinesis.   2. Right ventricular systolic function is low normal. The right ventricular size is normal.   3. The mitral valve is grossly normal. Mild to moderate mitral valve regurgitation.   4. The aortic valve is tricuspid. Aortic valve regurgitation is not visualized.   5. The inferior vena cava is dilated in size with <50% respiratory variability, suggesting right atrial pressure of 15 mmHg.   6. Compared to previous study in 10/2020, LVEF has further decreased from 20-25% to 15-20%.  Coronary Angioplasty 12/31/20 Impella support high risk intervention  Unsuccessful attempt at proximal LAD CTO intervention in spite of use of support catheters and multiple wires. 129 ml contrast used. No immediate complications.   Continue guideline directed medical therapy for heart failure discuss regarding any future attempts of revascularization.  EKG  08/20/2021: Sinus bradycardia at a rate of 56 bpm.  Left axis.  Inferior infarct old.  Anteroseptal infarct old.  Diffuse nonspecific T wave abnormality.  08/11/2020: Sinus tachycardia at rate of 120 bpm, atypical left bundle branch block.  No further analysis.   09/13/2020: Normal sinus rhythm at rate of 91 bpm, left atrial enlargement, inferior infarct old.  Anteroseptal infarct old.  Nonspecific T abnormality.  Compared to 08/11/2020, left bundle branch block not present.  04/19/2020: Normal sinus rhythm at rate of 61 bpm, inferior infarct old.  Anteroseptal infarct old.  Nonspecific T abnormality.  Borderline low voltage complexes.  No significant change from 11/08/2019.   Assessment     ICD-10-CM  1. Chronic systolic heart failure (HCC)  I50.22 EKG 12-Lead    2. Ischemic cardiomyopathy  I25.5     3. CAD in native artery  I25.10 EKG 12-Lead     Meds ordered this encounter  Medications   ezetimibe (ZETIA) 10 MG tablet    Sig: Take 1 tablet  (10 mg total) by mouth daily.    Dispense:  90 tablet    Refill:  3   Medications Discontinued During This Encounter  Medication Reason   doxycycline (VIBRA-TABS) 100 MG tablet Error   ezetimibe (ZETIA) 10 MG tablet Reorder       Recommendations:    DASHIEL BERGQUIST  is a 65 y.o. Caucasian male  with controlled type 2 diabetes, hypertension, hyperlipidemia, coronary artery disease by angiography on 09/08/2016 revealing severe triple-vessel CAD with no targets for CABG, he presented with new onset left bundle branch block and acute fulminant pulmonary edema needing intubation on 10/11/2020, emergent cardiac catheterization essentially revealing progression of severe native vessel disease with no significant option for revascularization.  Although LAD was felt to be amenable for potential revascularization, it was unchanged from prior cardiac catheterization and RCA was felt to be the culprit, in view of diffuse disease medical management was recommended.  Past medical history is also significant for morbid obesity, diabetes mellitus with stage III AV chronic kidney disease, mild carotid atherosclerosis, right SFA occlusion S/P complex PV angiogram on 02/28/2019 with PTA to occluded right SFA with implantation of 3 overlapping 6.0 x 120 x2; 6.0 x 100 mm Eluvia DES for critical right leg ischemia now resolved. 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 He has mild disease in the left leg.  PAD has remained stable. Due to frequent PVCs, he was also started on amiodarone prophylactically.   Patient was again hospitalized 12/28/2020-01/02/2021 for acute on chronic systolic heart failure he diuresed well and underwent unsuccessful attempt to proximal LAD CTO intervention, was again recommended continued medical therapy.  Due to renal insufficiency while on spironolactone, this has been discontinued.   Patient presents for 74-monthfollow-up of heart failure and CAD.  Patient has remained  relatively stable medical management and repeat BMP showed potassium remains <5.5, therefore have held off on reinitiation of Lokelma.  Patient remained stable and is tolerating medications without issue.  Lipids are now under excellent control with addition of Zetia.  There is no clinical evidence of acute heart failure at today's office visit.  Blood pressure is well controlled.  Patient continues to prefer medical management of CAD and heart failure as opposed to considering CAD and/or palliative care consult.  Patient remains well compensated despite severely reduced LVEF.  Therefore shared decision was to follow-up in 6 months, sooner if needed, for heart failure and CAD.   CAlethia Berthold PA-C 08/20/2021, 9:57 AM Office: [phone] CC: EMadelon Lips MD

## 2021-08-20 ENCOUNTER — Encounter: Payer: Self-pay | Admitting: Student

## 2021-08-20 ENCOUNTER — Ambulatory Visit: Payer: Medicare Other | Admitting: Student

## 2021-08-20 ENCOUNTER — Other Ambulatory Visit: Payer: Self-pay

## 2021-08-20 VITALS — BP 123/59 | HR 60 | Temp 98.2°F | Ht 69.5 in | Wt 276.0 lb

## 2021-08-20 DIAGNOSIS — I2510 Atherosclerotic heart disease of native coronary artery without angina pectoris: Secondary | ICD-10-CM

## 2021-08-20 DIAGNOSIS — I5022 Chronic systolic (congestive) heart failure: Secondary | ICD-10-CM

## 2021-08-20 DIAGNOSIS — I255 Ischemic cardiomyopathy: Secondary | ICD-10-CM

## 2021-08-20 MED ORDER — EZETIMIBE 10 MG PO TABS: 10.0000 mg | ORAL_TABLET | Freq: Every day | ORAL | 3 refills | Status: DC

## 2021-08-22 ENCOUNTER — Ambulatory Visit: Payer: Medicare Other | Admitting: Podiatry

## 2021-08-22 ENCOUNTER — Other Ambulatory Visit: Payer: Self-pay

## 2021-08-22 VITALS — Temp 98.6°F

## 2021-08-22 DIAGNOSIS — L97512 Non-pressure chronic ulcer of other part of right foot with fat layer exposed: Secondary | ICD-10-CM

## 2021-08-22 NOTE — Patient Instructions (Addendum)
You can change the bandage every other day. Wash the foot with soap and water or clean ulcer with wound cleanser. Apply a small piece of prism to the ulcer and then cover with a bandage. Stay off of the foot as much as possible and wear the offloading shoe. If you notice any increase in redness, drainage, swelling or any other signs of infection call the office immediately or go to the ER.

## 2021-08-26 NOTE — Progress Notes (Signed)
Subjective: 65 year old male presents the office today for concerns of recurrent wound on the right foot.  He states that after I last saw him the wound did heal he had a callus that he peeled off the callus himself likely causing the wound.  He has had circulation stents performed by Dr. Irven Shelling office.  He states he completed a course of doxycycline.  Currently denies any fevers or chills.  No purulence or any increase in swelling or redness to his foot.  No other concerns today.   Objective: AAO x3, NAD DP/PT pulses 1/4 bilaterally, CRT less than 3 seconds Plantar aspect of right foot just proximal to the metatarsal head laterally is hyperkeratotic lesion with dried blood.  After debridement of the callus.  There is a superficial wound measuring 0.5 x 0.2 cm without any probing, undermining or tunneling.  No surrounding erythema, ascending cellulitis.  There is no fluctuation crepitation.  No malodor. No pain with calf compression, swelling, warmth, erythema  Assessment: Ulceration right foot  Plan: -All treatment options discussed with the patient including all alternatives, risks, complications.  -Debrided the hyperkeratotic tissue today reveals underlying ulceration.  I debrided the wound down to healthy, granular tissue to help facilitate wound healing and to remove any nonviable devitalized tissue with a #312 blade scalpel.  No blood loss.  Tolerated the procedure well. -Prisma dressing changes every other day.  -Continue offloading, surgical shoe -Monitor for any clinical signs or symptoms of infection and directed to call the office immediately should any occur or go to the ER. -Patient encouraged to call the office with any questions, concerns, change in symptoms.   Trula Slade DPM

## 2021-09-02 ENCOUNTER — Other Ambulatory Visit: Payer: Self-pay

## 2021-09-02 ENCOUNTER — Encounter (INDEPENDENT_AMBULATORY_CARE_PROVIDER_SITE_OTHER): Payer: Medicare Other | Admitting: Ophthalmology

## 2021-09-02 DIAGNOSIS — Z961 Presence of intraocular lens: Secondary | ICD-10-CM

## 2021-09-15 ENCOUNTER — Ambulatory Visit: Payer: Medicare Other | Admitting: Podiatry

## 2021-09-15 ENCOUNTER — Other Ambulatory Visit: Payer: Self-pay

## 2021-09-15 DIAGNOSIS — E1140 Type 2 diabetes mellitus with diabetic neuropathy, unspecified: Secondary | ICD-10-CM | POA: Diagnosis not present

## 2021-09-15 DIAGNOSIS — L97512 Non-pressure chronic ulcer of other part of right foot with fat layer exposed: Secondary | ICD-10-CM | POA: Diagnosis not present

## 2021-09-15 MED ORDER — DOXYCYCLINE HYCLATE 100 MG PO TABS: 100.0000 mg | ORAL_TABLET | Freq: Two times a day (BID) | ORAL | 0 refills | Status: DC

## 2021-09-16 ENCOUNTER — Encounter (HOSPITAL_BASED_OUTPATIENT_CLINIC_OR_DEPARTMENT_OTHER): Payer: Medicare Other | Attending: Internal Medicine | Admitting: Internal Medicine

## 2021-09-16 DIAGNOSIS — E1151 Type 2 diabetes mellitus with diabetic peripheral angiopathy without gangrene: Secondary | ICD-10-CM | POA: Diagnosis not present

## 2021-09-16 DIAGNOSIS — I110 Hypertensive heart disease with heart failure: Secondary | ICD-10-CM | POA: Diagnosis not present

## 2021-09-16 DIAGNOSIS — L84 Corns and callosities: Secondary | ICD-10-CM | POA: Diagnosis not present

## 2021-09-16 DIAGNOSIS — Z89421 Acquired absence of other right toe(s): Secondary | ICD-10-CM | POA: Insufficient documentation

## 2021-09-16 DIAGNOSIS — I2510 Atherosclerotic heart disease of native coronary artery without angina pectoris: Secondary | ICD-10-CM | POA: Diagnosis not present

## 2021-09-16 DIAGNOSIS — I509 Heart failure, unspecified: Secondary | ICD-10-CM | POA: Insufficient documentation

## 2021-09-16 DIAGNOSIS — L97518 Non-pressure chronic ulcer of other part of right foot with other specified severity: Secondary | ICD-10-CM | POA: Insufficient documentation

## 2021-09-16 DIAGNOSIS — E1140 Type 2 diabetes mellitus with diabetic neuropathy, unspecified: Secondary | ICD-10-CM | POA: Insufficient documentation

## 2021-09-16 DIAGNOSIS — B964 Proteus (mirabilis) (morganii) as the cause of diseases classified elsewhere: Secondary | ICD-10-CM | POA: Diagnosis not present

## 2021-09-16 DIAGNOSIS — I252 Old myocardial infarction: Secondary | ICD-10-CM | POA: Insufficient documentation

## 2021-09-16 DIAGNOSIS — E11621 Type 2 diabetes mellitus with foot ulcer: Secondary | ICD-10-CM | POA: Diagnosis present

## 2021-09-16 NOTE — Progress Notes (Signed)
VASILIOS, OTTAWAY ([id number]) Visit Report for 09/16/2021 Chief Complaint Document Details Patient Name: Date of Service: Eduardo [name] MES M. 09/16/2021 9:00 A M Medical Record Number: [id number] Patient Account Number: [id number] Date of Birth/Sex: Treating RN: [birth year] (65 y.o. Male) Rhae Hammock Primary Care Provider: Vikki Ports Other Clinician: Referring Provider: Treating Provider/Extender: Fransico Honestii Marton in Treatment: 0 Information Obtained from: Patient Chief Complaint 04/28/2019; patient is referred here for wounds on his right and left foot in the setting of type 2 diabetes 09/16/2021; patient is here for wound review of wound on the right lateral foot in the setting of type 2 diabetes Electronic Signature(s) Signed: 09/16/2021 4:39:28 PM By: Linton Ham MD Entered By: Linton Ham on 09/16/2021 11:11:08 -------------------------------------------------------------------------------- Debridement Details Patient Name: Date of Service: Eduardo [name], Eduardo [name] MES M. 09/16/2021 9:00 A M Medical Record Number: [id number] Patient Account Number: [id number] Date of Birth/Sex: Treating RN: Nov 10, 1955 (64 y.o. Male) Rhae Hammock Primary Care Provider: Vikki Ports Other Clinician: Referring Provider: Treating Provider/Extender: Fransico Naoma Boxell in Treatment: 0 Debridement Performed for Assessment: Wound #3 Right,Plantar Foot Performed By: Physician Ricard Dillon., MD Debridement Type: Debridement Severity of Tissue Pre Debridement: Fat layer exposed Level of Consciousness (Pre-procedure): Awake and Alert Pre-procedure Verification/Time Out Yes - 10:14 Taken: Start Time: 10:15 T Area Debrided (L x W): otal 2 (cm) x 3.5 (cm) = 7 (cm) Tissue and other material debrided: Non-Viable, Subcutaneous Level: Skin/Subcutaneous Tissue Debridement Description: Excisional Instrument: Blade, Forceps, Scissors Specimen: Swab, Number of  Specimens T aken: 1 Bleeding: Moderate Hemostasis Achieved: Silver Nitrate End Time: 10:22 Response to Treatment: Procedure was tolerated well Level of Consciousness (Post- Awake and Alert procedure): Post Debridement Measurements of Total Wound Length: (cm) 2 Width: (cm) 3.5 Depth: (cm) 0.2 Volume: (cm) 1.1 Character of Wound/Ulcer Post Debridement: Stable Severity of Tissue Post Debridement: Fat layer exposed Post Procedure Diagnosis Same as Pre-procedure Electronic Signature(s) Signed: 09/16/2021 4:39:28 PM By: Linton Ham MD Signed: 09/16/2021 4:45:49 PM By: [name] Entered By: Linton Ham on 09/16/2021 11:10:39 -------------------------------------------------------------------------------- HPI Details Patient Name: Date of Service: Eduardo [name] MES M. 09/16/2021 9:00 A M Medical Record Number: [id number] Patient Account Number: [id number] Date of Birth/Sex: Treating RN: December 04, 1955 (64 y.o. 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 He developed a callused area with an open area over the right fifth metatarsal head that was noted by his girlfriend in April. He was seen by Dr. Einar Gip surrounding this time for known PAD. He had arteriogram on 02/28/2019 that showed a right SFA occlusion that was open successfully post stenting. He is due to have another angiogram next week to look at the left side. This is also by Dr. Einar Gip. The patient has applied various dressings to the right foot including alginate and more recently Silvadene cream. He has recently completed a course of Augmentin. He also has severe neuropathy. I have reviewed the x-rays done in Dr. Leigh Aurora office on [facility] health link.  I agree there does not seem to be any plain x-ray suggestion of osteomyelitis in the right fifth met head About a month ago he also noted an open area on the left foot. This is not nearly as worrisome as the right side at this point. 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 History of peritoneal abscess, Most recent ABIs done by Dr. Einar Gip on 04/07/2019 showed monophasic waveforms on the left biphasic waveforms on the right. ABIs were 1.0 bilaterally 05/04/19; patient came in today with increasing pain in the right foot. Bone scraping admitted last week showed Enterococcus faecalis he was on Augmentin until sometime last week, amoxicillin is the treatment of choice for enterococcus nevertheless I'll restart this. His MRI is booked for 6/30. He saw Dr. Kris Hartmann. He underwent angiography. Please loosely placed stents in the common femoral and right proximal SFA were widely patent. The left side showed iliac artery and femoral arteries to have very mild disease. 2 vessel runoff in the left leg his left AT is occluded. 7/2; patient's MRI as feared was not very good. Cellulitis and myositis. 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 Also suspected septic arthritis at the joint. Bone scraping I did when he first came in showed enterococcus faecalis he has been on on Augmentin but he finished this. 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 Sharol Given. He apparently tolerated this well and sees him in the afternoon. He also has the area on the lateral part of the left fifth met head. We have been using silver alginate to this. 7/31; we did not look at the right fifth ray amputation site is been seen by orthopedics this  afternoon. He has a small area on the left lateral fifth met head. Undermining laterally. We have been using silver alginate. 8/14-Patient comes a 2 weeks with left lateral fifth toe met head wound which is looking better continue to use silver alginate the right fifth ray amputation is handled by Dr. Sharol Given and he placed him on doxycycline this week 8/28; wound on the lateral left fifth metatarsal head has closed over. He still has an open wound on the right foot which is a surgical wound being managed by Dr. Sharol Given READMISSION 09/16/2021 Eduardo [name] is now a 65 year old man with type 2 diabetes. He was in the clinic in 2020 with an area on the left fifth metatarsal head and also a surgical wound on the right foot. My notes state that the latter was followed by Dr. Sharol Given and ultimately ended up with a right fifth toe amputation. More recently he has been followed by Dr. Earleen Newport at [facility] for an area on the right lateral foot. This goes back in the summertime. The patient tells me that he has had this open on and off for about a year. Will cover with callus and then reopen. X-ray apparently on 9/27 has been negative. He has been using a Pegasys shoe. He has undergone debridements by Dr. Jacqualyn Posey . Was started on collagen roughly 2 weeks ago. He had a course of doxycycline in October and was started yesterday on another course of doxycycline. The patient has a history of PAD followed by Dr. Einar Gip Past medical history includes congestive heart failure, coronary artery disease status post MI in December 2021, ischemic cardiomyopathy he has an LVAD. Type 2 diabetes with good control with an A1c of 6.5. According to the patient he has had stents placed by Dr. Einar Gip x3 in the right leg. Most recent ABIs on 08/14/2021 showed  a ABI of 1 on the right and 0.84 on the left. As mentioned apparent stents in the right leg although I have not looked over these records Electronic Signature(s) Signed: 09/16/2021  4:39:28 PM By: Linton Ham MD Signed: 09/16/2021 4:39:28 PM By: Linton Ham MD Entered By: Linton Ham on 09/16/2021 11:14:23 -------------------------------------------------------------------------------- Physical Exam Details Patient Name: Date of Service: Eduardo [name] MES M. 09/16/2021 9:00 A M Medical Record Number: [id number] Patient Account Number: [id number] Date of Birth/Sex: Treating RN: [birth year] (64 y.o. Male) Rhae Hammock Primary Care Provider: Vikki Ports Other Clinician: Referring Provider: Treating Provider/Extender: Fransico Vietta Bonifield in Treatment: 0 Constitutional Sitting or standing Blood Pressure is within target range for patient.. Pulse regular and within target range for patient.Marland Kitchen Respirations regular, non-labored and within target range.. Temperature is normal and within the target range for the patient.Marland Kitchen Appears in no distress. Respiratory work of breathing is normal. Cardiovascular Pedal pulses are palpable on the right. Integumentary (Hair, Skin) Skin and subcutaneous tissue without rashes, lesions. Notes Wound exam; the areas on the right lateral foot. Previous right first toe amputationo Ray amputation. The wound itself was small but there was a blister with which the superior part of his wound communicated. The entire surface area of the blister was palpable using a skinny again communicating from the wound area. I used pickups and a #15 scalpel to remove the thick skin and subcutaneous tissue over the top of the superior blister area. There was a nonviable surface which was cleaned up with a scalpel. Hemostasis with silver nitrate and a pressure dressing Electronic Signature(s) Signed: 09/16/2021 4:39:28 PM By: Linton Ham MD Entered By: Linton Ham on 09/16/2021 11:16:15 -------------------------------------------------------------------------------- Physician Orders Details Patient Name: Date of Service: Eduardo [name] MES M. 09/16/2021 9:00 A M Medical Record Number: [id number] Patient Account Number: [id number] Date of Birth/Sex: Treating RN: [birth year] (65 y.o. Male) Lorrin Jackson Primary Care Provider: Vikki Ports Other Clinician: Referring Provider: Treating Provider/Extender: Fransico Alben Jepsen in Treatment: 0 Verbal / Phone Orders: No Diagnosis Coding Follow-up Appointments ppointment in 1 week. - with Dr. Dellia Nims Return A Other: - Halo=Supplies Bathing/ Shower/ Hygiene May shower and wash wound with soap and water. Off-Loading Open toe surgical shoe to: - with PegAssist insert Additional Orders / Instructions Follow Nutritious Diet - -High Protein Diet -Keep monitoring blood sugar Wound Treatment Wound #3 - Foot Wound Laterality: Plantar, Right Cleanser: Soap and Water 1 x Per Day/30 Days Discharge Instructions: May shower and wash wound with dial antibacterial soap and water prior to dressing change. Prim Dressing: KerraCel Ag Gelling Fiber Dressing, 2x2 in (silver alginate) 1 x Per Day/30 Days ary Discharge Instructions: Apply silver alginate to wound bed as instructed Secondary Dressing: Woven Gauze Sponge, Non-Sterile 4x4 in 1 x Per Day/30 Days Discharge Instructions: Apply over primary dressing as directed. Secondary Dressing: Optifoam Non-Adhesive Dressing, 4x4 in 1 x Per Day/30 Days Discharge Instructions: Apply as foam donut Secured With: Kerlix Roll Sterile, 4.5x3.1 (in/yd) 1 x Per Day/30 Days Discharge Instructions: Secure with Kerlix as directed. Secured With: Transpore Surgical Tape, 2x10 (in/yd) 1 x Per Day/30 Days Discharge Instructions: Secure dressing with tape as directed. Laboratory erobe culture (MICRO) - Right Foot Bacteria identified in Unspecified specimen by A LOINC Code: 226-3 Convenience Name: Areobic culture-specimen not specified Electronic Signature(s) Signed: 09/16/2021 4:39:28 PM By: Linton Ham MD Signed: 09/16/2021  4:51:17 PM By: Lorrin Jackson Entered By: Lorrin Jackson on 09/16/2021 10:39:42 -------------------------------------------------------------------------------- Problem List  Details Patient Name: Date of Service: Eduardo [name] MES M. 09/16/2021 9:00 A M Medical Record Number: [id number] Patient Account Number: [id number] Date of Birth/Sex: Treating RN: 02-Feb-1956 (65 y.o. Male) Rhae Hammock Primary Care Provider: Vikki Ports Other Clinician: Referring Provider: Treating Provider/Extender: Fransico Apolonio Cutting in Treatment: 0 Active Problems ICD-10 Encounter Code Description Active Date MDM Diagnosis E11.621 Type 2 diabetes mellitus with foot ulcer 09/16/2021 No Yes L97.518 Non-pressure chronic ulcer of other part of right foot with other specified 09/16/2021 No Yes severity E11.51 Type 2 diabetes mellitus with diabetic peripheral angiopathy without gangrene 09/16/2021 No Yes Inactive Problems Resolved Problems Electronic Signature(s) Signed: 09/16/2021 4:39:28 PM By: Linton Ham MD Entered By: Linton Ham on 09/16/2021 11:09:55 -------------------------------------------------------------------------------- Progress Note Details Patient Name: Date of Service: Eduardo [name] MES M. 09/16/2021 9:00 A M Medical Record Number: [id number] Patient Account Number: [id number] Date of Birth/Sex: Treating RN: 05-Jul-1956 (64 y.o. 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 He developed a callused area with an open area over the right fifth metatarsal head that was noted by his girlfriend in April. He was seen by Dr. Einar Gip surrounding this time for known PAD. He had arteriogram on 02/28/2019 that showed a right SFA occlusion that was open successfully post stenting. He is due to have another angiogram next week to look at the left side. This is also by Dr. Einar Gip. The patient has applied various dressings to the right foot including alginate and more recently Silvadene cream. He has recently completed a course of Augmentin. He also has severe neuropathy. I have reviewed the x-rays done in Dr. Leigh Aurora office on [facility] health link. I agree there does not seem to be any plain x-ray suggestion of osteomyelitis in the right fifth met head About a month ago he also noted an open area on the left foot. This is not nearly as worrisome as the right side at this point. 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 History of peritoneal abscess, Most recent ABIs done by Dr. Einar Gip on 04/07/2019 showed monophasic waveforms on the left biphasic waveforms on the right. ABIs were 1.0 bilaterally 05/04/19; patient came in today with increasing pain in the right foot. Bone scraping admitted last week showed Enterococcus faecalis he was on Augmentin until sometime last week, amoxicillin is the treatment of choice for enterococcus nevertheless I'll restart this. His MRI is booked for 6/30. He saw Dr. Kris Hartmann. He underwent angiography. Please loosely placed stents in the common femoral and right proximal SFA were widely patent. The left side showed iliac artery and femoral arteries to have very mild disease. 2 vessel runoff in the left leg his left AT is  occluded. 7/2; patient's MRI as feared was not very good. Cellulitis and myositis. 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 Also  suspected septic arthritis at the joint. Bone scraping I did when he first came in showed enterococcus faecalis he has been on on Augmentin but he finished this. 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 Sharol Given. He apparently tolerated this well and sees him in the afternoon. He also has the area on the lateral part of the left fifth met head. We have been using silver alginate to this. 7/31; we did not look at the right fifth ray amputation site is been seen by orthopedics this afternoon. He has a small area on the left lateral fifth met head. Undermining laterally. We have been using silver alginate. 8/14-Patient comes a 2 weeks with left lateral fifth toe met head wound which is looking better continue to use silver alginate the right fifth ray amputation is handled by Dr. Sharol Given and he placed him on doxycycline this week 8/28; wound on the lateral left fifth metatarsal head has closed over. He still has an open wound on the right foot which is a surgical wound being managed by Dr. Sharol Given READMISSION 09/16/2021 Eduardo [name] is now a 65 year old man with type 2 diabetes. He was in the clinic in 2020 with an area on the left fifth metatarsal head and also a surgical wound on the right foot. My notes state that the latter was followed by Dr. Sharol Given and ultimately ended up with a right fifth toe amputation. More recently he has been followed by Dr. Earleen Newport at [facility] for an area on the right lateral foot. This goes back in the summertime. The patient tells me that he has had this open on and off for about a year. Will cover with callus and then reopen. X-ray apparently on 9/27 has been negative. He has been using a Pegasys shoe. He has undergone debridements by Dr.  Jacqualyn Posey . Was started on collagen roughly 2 weeks ago. He had a course of doxycycline in October and was started yesterday on another course of doxycycline. The patient has a history of PAD followed by Dr. Einar Gip Past medical history includes congestive heart failure, coronary artery disease status post MI in December 2021, ischemic cardiomyopathy he has an LVAD. Type 2 diabetes with good control with an A1c of 6.5. According to the patient he has had stents placed by Dr. Einar Gip x3 in the right leg. Most recent ABIs on 08/14/2021 showed a ABI of 1 on the right and 0.84 on the left. As mentioned apparent stents in the right leg although I have not looked over these records Patient History Information obtained from Patient. Allergies shellfish derived, latex, testosterone, adhesive tape General Notes: Mollusks specifically Family History Diabetes - Mother. Social History Former smoker - quit 2013, Marital Status - Widowed, Alcohol Use - Rarely, Drug Use - No History, Caffeine Use - Daily - Coffee. Medical History Eyes Patient has history of Cataracts - surgery Denies history of Glaucoma, Optic Neuritis Ear/Nose/Mouth/Throat Denies history of Chronic sinus problems/congestion, Middle ear problems Respiratory Denies history of Aspiration, Asthma, Chronic Obstructive Pulmonary Disease (COPD), Pneumothorax, Sleep Apnea, Tuberculosis Cardiovascular Patient has history of Congestive Heart Failure, Coronary Artery Disease, Hypertension, Myocardial Infarction, Peripheral Arterial Disease Denies history of Angina, Arrhythmia, Deep Vein Thrombosis, Hypotension, Peripheral Venous Disease, Phlebitis, Vasculitis Endocrine Patient has history of Type II Diabetes Genitourinary Denies history of End Stage Renal Disease Immunological Denies history of Lupus Erythematosus, Raynaudoos, Scleroderma Integumentary (Skin) Denies history of History of Burn  Musculoskeletal Patient has history of Osteomyelitis  - past hx Denies history of Gout, Rheumatoid Arthritis, Osteoarthritis Neurologic Patient has history of Neuropathy - Diabetic Denies history of Dementia, Quadriplegia, Paraplegia, Seizure Disorder Oncologic Denies history of Received Chemotherapy, Received Radiation Psychiatric Denies history of Anorexia/bulimia, Confinement Anxiety Medical A Surgical History Notes nd Gastrointestinal Colon polyp Genitourinary Stage 3 CKD Review of Systems (ROS) Eyes Complains or has symptoms of Glasses / Contacts. Ear/Nose/Mouth/Throat Denies complaints or symptoms of Chronic sinus problems or rhinitis. Gastrointestinal Denies complaints or symptoms of Frequent diarrhea, Nausea, Vomiting. Genitourinary Denies complaints or symptoms of Frequent urination. Integumentary (Skin) Complains or has symptoms of Wounds - right foot. Objective Constitutional Sitting or standing Blood Pressure is within target range for patient.. Pulse regular and within target range for patient.Marland Kitchen Respirations regular, non-labored and within target range.. Temperature is normal and within the target range for the patient.Marland Kitchen Appears in no distress. Vitals Time Taken: 9:31 AM, Height: 70 in, Source: Stated, Weight: 274 lbs, Source: Stated, BMI: 39.3, Temperature: 98.3 F, Pulse: 76 bpm, Respiratory Rate: 18 breaths/min, Blood Pressure: 134/75 mmHg, Capillary Blood Glucose: 117 mg/dl. Respiratory work of breathing is normal. Cardiovascular Pedal pulses are palpable on the right. General Notes: Wound exam; the areas on the right lateral foot. Previous right first toe amputationo Ray amputation. The wound itself was small but there was a blister with which the superior part of his wound communicated. The entire surface area of the blister was palpable using a skinny again communicating from the wound area. I used pickups and a #15 scalpel to remove the thick skin and subcutaneous tissue over the top of the superior blister  area. There was a nonviable surface which was cleaned up with a scalpel. Hemostasis with silver nitrate and a pressure dressing Integumentary (Hair, Skin) Skin and subcutaneous tissue without rashes, lesions. Wound #3 status is Open. Original cause of wound was Gradually Appeared. The date acquired was: 12/11/2019. The wound is located on the [place]. The wound measures 2cm length x 3.5cm width x 0.2cm depth; 5.498cm^2 area and 1.1cm^3 volume. There is Fat Layer (Subcutaneous Tissue) exposed. There is no undermining noted, however, there is tunneling at 12:00 with a maximum distance of 2cm. There is a medium amount of serosanguineous drainage noted. The wound margin is distinct with the outline attached to the wound base. There is large (67-100%) red granulation within the wound bed. There is a small (1-33%) amount of necrotic tissue within the wound bed including Adherent Slough. General Notes: Calloused Periwound Assessment Active Problems ICD-10 Type 2 diabetes mellitus with foot ulcer Non-pressure chronic ulcer of other part of right foot with other specified severity Type 2 diabetes mellitus with diabetic peripheral angiopathy without gangrene Procedures Wound #3 Pre-procedure diagnosis of Wound #3 is a Diabetic Wound/Ulcer of the Lower Extremity located on the Right,Plantar Foot .Severity of Tissue Pre Debridement is: Fat layer exposed. There was a Excisional Skin/Subcutaneous Tissue Debridement with a total area of 7 sq cm performed by Ricard Dillon., MD. With the following instrument(s): Blade, Forceps, and Scissors to remove Non-Viable tissue/material. Material removed includes Subcutaneous Tissue. 1 specimen was taken by a Swab and sent to the lab per facility protocol. A time out was conducted at 10:14, prior to the start of the procedure. A Moderate amount of bleeding was controlled with Silver Nitrate. The procedure was tolerated well. Post Debridement Measurements:  2cm length x 3.5cm width x 0.2cm depth; 1.1cm^3 volume. Character of Wound/Ulcer Post Debridement is stable.  Severity of Tissue Post Debridement is: Fat layer exposed. Post procedure Diagnosis Wound #3: Same as Pre-Procedure Plan Follow-up Appointments: Return Appointment in 1 week. - with Dr. Dellia Nims Other: - [employer]=Supplies Bathing/ Shower/ Hygiene: May shower and wash wound with soap and water. Off-Loading: Open toe surgical shoe to: - with PegAssist insert Additional Orders / Instructions: Follow Nutritious Diet - -High Protein Diet -Keep monitoring blood sugar Laboratory ordered were: Areobic culture-specimen not specified - Right Foot WOUND #3: - Foot Wound Laterality: Plantar, Right Cleanser: Soap and Water 1 x Per Day/30 Days Discharge Instructions: May shower and wash wound with dial antibacterial soap and water prior to dressing change. Prim Dressing: KerraCel Ag Gelling Fiber Dressing, 2x2 in (silver alginate) 1 x Per Day/30 Days ary Discharge Instructions: Apply silver alginate to wound bed as instructed Secondary Dressing: Woven Gauze Sponge, Non-Sterile 4x4 in 1 x Per Day/30 Days Discharge Instructions: Apply over primary dressing as directed. Secondary Dressing: Optifoam Non-Adhesive Dressing, 4x4 in 1 x Per Day/30 Days Discharge Instructions: Apply as foam donut Secured With: Kerlix Roll Sterile, 4.5x3.1 (in/yd) 1 x Per Day/30 Days Discharge Instructions: Secure with Kerlix as directed. Secured With: Transpore Surgical T ape, 2x10 (in/yd) 1 x Per Day/30 Days Discharge Instructions: Secure dressing with tape as directed. #1 extensive debridement of the blistered area. 2. Post debridement a culture was done but no empiric antibiotics. 3 I am going to use silver alginate as the primary dressing continuing the Pegasys shoe 4. He is being fairly conscientious and attempting to offload this. 5. The patient has known PAD apparently with stents in the right leg per the  patient's description. I did not see this in my initial review of care everywhere/epic I will need to review this nevertheless his recent ABIs were good on that side and the bleeding and the wound was really quite significant. 6. I cannot rule out a total contact cast here nor more advanced imaging but I told the patient for the next 2 to 3 weeks we would continue with conservative measures to see if we can get any improvement here. 7. I did a culture although the patient is on doxycycline. Cannot rule out MolecuLight next week as well I spent 35 minutes in review of this patient's past medical history, face-to-face evaluation and preparation of this record Electronic Signature(s) Signed: 09/16/2021 4:39:28 PM By: Linton Ham MD Entered By: Linton Ham on 09/16/2021 11:20:40 -------------------------------------------------------------------------------- HxROS Details Patient Name: Date of Service: Eduardo [name] MES M. 09/16/2021 9:00 A M Medical Record Number: [id number] Patient Account Number: [id number] Date of Birth/Sex: Treating RN: 1956/05/17 (65 y.o. Male) Lorrin Jackson Primary Care Provider: Vikki Ports Other Clinician: Referring Provider: Treating Provider/Extender: Fransico Zeda Gangwer in Treatment: 0 Information Obtained From Patient Eyes Complaints and Symptoms: Positive for: Glasses / Contacts Medical History: Positive for: Cataracts - surgery Negative for: Glaucoma; Optic Neuritis Ear/Nose/Mouth/Throat Complaints and Symptoms: Negative for: Chronic sinus problems or rhinitis Medical History: Negative for: Chronic sinus problems/congestion; Middle ear problems Gastrointestinal Complaints and Symptoms: Negative for: Frequent diarrhea; Nausea; Vomiting Medical History: Past Medical History Notes: Colon polyp Genitourinary Complaints and Symptoms: Negative for: Frequent urination Medical History: Negative for: End Stage Renal Disease Past  Medical History Notes: Stage 3 CKD Integumentary (Skin) Complaints and Symptoms: Positive for: Wounds - right foot Medical History: Negative for: History of Burn Hematologic/Lymphatic Respiratory Medical History: Negative for: Aspiration; Asthma; Chronic Obstructive Pulmonary Disease (COPD); Pneumothorax; Sleep Apnea; Tuberculosis Cardiovascular Medical History: Positive for: Congestive  Heart Failure; Coronary Artery Disease; Hypertension; Myocardial Infarction; Peripheral Arterial Disease Negative for: Angina; Arrhythmia; Deep Vein Thrombosis; Hypotension; Peripheral Venous Disease; Phlebitis; Vasculitis Endocrine Medical History: Positive for: Type II Diabetes Time with diabetes: since 1996 Treated with: Insulin Blood sugar tested every day: Yes Tested : CGM Immunological Medical History: Negative for: Lupus Erythematosus; Raynauds; Scleroderma Musculoskeletal Medical History: Positive for: Osteomyelitis - past hx Negative for: Gout; Rheumatoid Arthritis; Osteoarthritis Neurologic Medical History: Positive for: Neuropathy - Diabetic Negative for: Dementia; Quadriplegia; Paraplegia; Seizure Disorder Oncologic Medical History: Negative for: Received Chemotherapy; Received Radiation Psychiatric Medical History: Negative for: Anorexia/bulimia; Confinement Anxiety HBO Extended History Items Eyes: Cataracts Immunizations Pneumococcal Vaccine: Received Pneumococcal Vaccination: Yes Received Pneumococcal Vaccination On or After 60th Birthday: No Implantable Devices None Family and Social History Diabetes: Yes - Mother; Former smoker - quit 2013; Marital Status - Widowed; Alcohol Use: Rarely; Drug Use: No History; Caffeine Use: Daily - Coffee; Financial Concerns: No; Food, Clothing or Shelter Needs: No; Support System Lacking: No; Transportation Concerns: No Electronic Signature(s) Signed: 09/16/2021 4:39:28 PM By: Linton Ham MD Signed: 09/16/2021 4:51:17 PM By:  Lorrin Jackson Entered By: Lorrin Jackson on 09/16/2021 09:47:07 -------------------------------------------------------------------------------- Diamond Details Patient Name: Date of Service: Eduardo [name] MES M. 09/16/2021 Medical Record Number: [id number] Patient Account Number: [id number] Date of Birth/Sex: Treating RN: Oct 29, 1956 (64 y.o. Male) Lorrin Jackson Primary Care Provider: Vikki Ports Other Clinician: Referring Provider: Treating Provider/Extender: Fransico Tyan Dy in Treatment: 0 Diagnosis Coding ICD-10 Codes Code Description 9470093893 Type 2 diabetes mellitus with foot ulcer L97.518 Non-pressure chronic ulcer of other part of right foot with other specified severity E11.51 Type 2 diabetes mellitus with diabetic peripheral angiopathy without gangrene Facility Procedures CPT4 Code: [id number] Description: 214 [id number] - WOUND CARE VISIT-LEV 2 EST PT Modifier: 25 Quantity: 1 CPT4 Code: [id number] Description: Effie - DEB SUBQ TISSUE 20 SQ CM/< ICD-10 Diagnosis Description L97.518 Non-pressure chronic ulcer of other part of right foot with other specified sever Modifier: ity Quantity: 1 Physician Procedures : CPT4 Code Description Modifier [id number] - WC PHYS LEVEL 4 - EST PT 25 ICD-10 Diagnosis Description E11.621 Type 2 diabetes mellitus with foot ulcer L97.518 Non-pressure chronic ulcer of other part of right foot with other specified severity E11.51  Type 2 diabetes mellitus with diabetic peripheral angiopathy without gangrene Quantity: 1 : [id number] 28118 - WC PHYS SUBQ TISS 20 SQ CM ICD-10 Diagnosis Description L97.518 Non-pressure chronic ulcer of other part of right foot with other specified severity Quantity: 1 Electronic Signature(s) Signed: 09/16/2021 4:39:28 PM By: Linton Ham MD Entered By: Linton Ham on 09/16/2021 11:19:05

## 2021-09-16 NOTE — Progress Notes (Signed)
Eduardo [name], Eduardo [name] ([id number]) Visit Report for 09/16/2021 Abuse/Suicide Risk Screen Details Patient Name: Date of Service: Eduardo [name] MES M. 09/16/2021 9:00 A M Medical Record Number: [id number] Patient Account Number: [id number] Date of Birth/Sex: Treating RN: 1956/09/26 (64 y.o. Male) Lorrin Jackson Primary Care Rasheeda Mulvehill: Vikki Ports Other Clinician: Referring Alonte Wulff: Treating Sahej Hauswirth/Extender: Fransico Michael in Treatment: 0 Abuse/Suicide Risk Screen Items Answer ABUSE RISK SCREEN: Has anyone close to you tried to hurt or harm you recentlyo No Do you feel uncomfortable with anyone in your familyo No Has anyone forced you do things that you didnt want to doo No Electronic Signature(s) Signed: 09/16/2021 4:51:17 PM By: Lorrin Jackson Entered By: Lorrin Jackson on 09/16/2021 09:47:16 -------------------------------------------------------------------------------- Activities of Daily Living Details Patient Name: Date of Service: Eduardo [name] MES M. 09/16/2021 9:00 A M Medical Record Number: [id number] Patient Account Number: [id number] Date of Birth/Sex: Treating RN: March 09, 1956 (65 y.o. Male) Lorrin Jackson Primary Care Irlene Crudup: Vikki Ports Other Clinician: Referring Tegan Britain: Treating Torien Ramroop/Extender: Fransico Michael in Treatment: 0 Activities of Daily Living Items Answer Activities of Daily Living (Please select one for each item) Drive Automobile Completely Able T Medications ake Completely Able Use T elephone Completely Able Care for Appearance Completely Able Use T oilet Completely Able Bath / Shower Completely Able Dress Self Completely Able Feed Self Completely Able Walk Completely Able Get In / Out Bed Completely Able Housework Completely Able Prepare Meals Completely [place] for Self Completely Able Electronic Signature(s) Signed: 09/16/2021 4:51:17 PM By: Lorrin Jackson Entered By:  Lorrin Jackson on 09/16/2021 09:48:26 -------------------------------------------------------------------------------- Education Screening Details Patient Name: Date of Service: Eduardo [name], Eduardo [name] MES M. 09/16/2021 9:00 A M Medical Record Number: [id number] Patient Account Number: [id number] Date of Birth/Sex: Treating RN: 1956-05-28 (65 y.o. Male) Lorrin Jackson Primary Care Anyae Griffith: Vikki Ports Other Clinician: Referring Crytal Pensinger: Treating Yurani Fettes/Extender: Fransico Michael in Treatment: 0 Primary Learner Assessed: Patient Learning Preferences/Education Level/Primary Language Learning Preference: Explanation, Demonstration, Printed Material Highest Education Level: College or Above Preferred Language: English Cognitive Barrier Language Barrier: No Translator Needed: No Memory Deficit: No Emotional Barrier: No Cultural/Religious Beliefs Affecting Medical Care: No Physical Barrier Impaired Vision: Yes Glasses Impaired Hearing: No Decreased Hand dexterity: No Knowledge/Comprehension Knowledge Level: High Comprehension Level: High Ability to understand written instructions: High Ability to understand verbal instructions: High Motivation Anxiety Level: Calm Cooperation: Cooperative Education Importance: Acknowledges Need Interest in Health Problems: Asks Questions Perception: Coherent Willingness to Engage in Self-Management High Activities: Readiness to Engage in Self-Management High Activities: Electronic Signature(s) Signed: 09/16/2021 4:51:17 PM By: Lorrin Jackson Entered By: Lorrin Jackson on 09/16/2021 09:49:32 -------------------------------------------------------------------------------- Fall Risk Assessment Details Patient Name: Date of Service: Eduardo [name], Eduardo [name] MES M. 09/16/2021 9:00 A M Medical Record Number: [id number] Patient Account Number: [id number] Date of Birth/Sex: Treating RN: 09-21-1956 (64 y.o. Male) Lorrin Jackson Primary Care  [name]: Vikki Ports Other Clinician: Referring Kielee Care: Treating Jayln Branscom/Extender: Fransico Michael in Treatment: 0 Fall Risk Assessment Items Have you had 2 or more falls in the last 12 monthso 0 No Have you had any fall that resulted in injury in the last 12 monthso 0 No FALLS RISK SCREEN History of falling - immediate or within 3 months 0 No Secondary diagnosis (Do you have 2 or more medical diagnoseso) 15 Yes Ambulatory aid None/bed rest/wheelchair/nurse 0 Yes Crutches/cane/walker 0 No Furniture 0 No Intravenous therapy Access/Saline/Heparin Lock 0 No Gait/Transferring Normal/ bed  rest/ wheelchair 0 Yes Weak (short steps with or without shuffle, stooped but able to lift head while walking, may seek 0 No support from furniture) Impaired (short steps with shuffle, may have difficulty arising from chair, head down, impaired 0 No balance) Mental Status Oriented to own ability 0 Yes Electronic Signature(s) Signed: 09/16/2021 4:51:17 PM By: Lorrin Jackson Entered By: Lorrin Jackson on 09/16/2021 09:49:54 -------------------------------------------------------------------------------- Foot Assessment Details Patient Name: Date of Service: Eduardo [name] MES M. 09/16/2021 9:00 A M Medical Record Number: [id number] Patient Account Number: [id number] Date of Birth/Sex: Treating RN: Nov 15, 1955 (65 y.o. Male) Lorrin Jackson Primary Care Guerin Lashomb: Vikki Ports Other Clinician: Referring Brigitta Pricer: Treating Brek Reece/Extender: Fransico Michael in Treatment: 0 Foot Assessment Items Site Locations + = Sensation present, - = Sensation absent, C = Callus, U = Ulcer R = Redness, W = Warmth, M = Maceration, PU = Pre-ulcerative lesion F = Fissure, S = Swelling, D = Dryness Assessment Right: Left: Other Deformity: No No Prior Foot Ulcer: No No Prior Amputation: Yes No Charcot Joint: No No Ambulatory Status: Ambulatory Without Help Gait:  Steady Electronic Signature(s) Signed: 09/16/2021 4:51:17 PM By: Lorrin Jackson Entered By: Lorrin Jackson on 09/16/2021 09:51:48 -------------------------------------------------------------------------------- Nutrition Risk Screening Details Patient Name: Date of Service: Eduardo [name] MES M. 09/16/2021 9:00 A M Medical Record Number: [id number] Patient Account Number: [id number] Date of Birth/Sex: Treating RN: 01-05-1956 (65 y.o. Male) Lorrin Jackson Primary Care German Manke: Vikki Ports Other Clinician: Referring Laurelai Lepp: Treating Saloni Lablanc/Extender: Fransico Michael in Treatment: 0 Height (in): 70 Weight (lbs): 274 Body Mass Index (BMI): 39.3 Nutrition Risk Screening Items Score Screening NUTRITION RISK SCREEN: I have an illness or condition that made me change the kind and/or amount of food I eat 0 No I eat fewer than two meals per day 0 No I eat few fruits and vegetables, or milk products 0 No I have three or more drinks of beer, liquor or wine almost every day 0 No I have tooth or mouth problems that make it hard for me to eat 0 No I don't always have enough money to buy the food I need 0 No I eat alone most of the time 0 No I take three or more different prescribed or over-the-counter drugs a day 1 Yes Without wanting to, I have lost or gained 10 pounds in the last six months 0 No I am not always physically able to shop, cook and/or feed myself 0 No Nutrition Protocols Good Risk Protocol 0 No interventions needed Moderate Risk Protocol High Risk Proctocol Risk Level: Good Risk Score: 1 Electronic Signature(s) Signed: 09/16/2021 4:51:17 PM By: Lorrin Jackson Entered By: Lorrin Jackson on 09/16/2021 09:50:32

## 2021-09-17 NOTE — Progress Notes (Signed)
LEDGER, HEINDL ([id number]) Visit Report for 09/16/2021 Allergy List Details Patient Name: Date of Service: Eduardo [name] MES M. 09/16/2021 9:00 A M Medical Record Number: [id number] Patient Account Number: [id number] Date of Birth/Sex: Treating RN: [birth year] (65 y.o. Male) Lorrin Jackson Primary Care Yamari Ventola: Vikki Ports Other Clinician: Referring Travon Crochet: Treating Merdith Maiorino/Extender: Fransico Michael in Treatment: 0 Allergies Active Allergies shellfish derived latex testosterone adhesive tape Allergy Notes Mollusks specifically Electronic Signature(s) Signed: 09/16/2021 4:51:17 PM By: Lorrin Jackson Entered By: Lorrin Jackson on 09/16/2021 09:42:32 -------------------------------------------------------------------------------- Arrival Information Details Patient Name: Date of Service: Eduardo [name], Eduardo [name] MES M. 09/16/2021 9:00 A M Medical Record Number: [id number] Patient Account Number: [id number] Date of Birth/Sex: Treating RN: 1956/10/02 (65 y.o. Male) Rhae Hammock Primary Care Devontaye Ground: Vikki Ports Other Clinician: Referring Blue Winther: Treating Makenli Derstine/Extender: Fransico Michael in Treatment: 0 Visit Information Patient Arrived: Ambulatory Arrival Time: 09:31 Accompanied By: self Transfer Assistance: None Patient Identification Verified: Yes Secondary Verification Process Completed: Yes Patient Requires Transmission-Based Precautions: No Patient Has Alerts: Yes Patient Alerts: Patient on Blood Thinner 08/14/21 ABI R=1.0 L=.84 History Since Last Visit Added or deleted any medications: No Any new allergies or adverse reactions: No Had a fall or experienced change in activities of daily living that may affect risk of falls: No Signs or symptoms of abuse/neglect since last visito No Hospitalized since last visit: No Implantable device outside of the clinic excluding cellular tissue based products placed in the center since last  visit: No Has Dressing in Place as Prescribed: Yes Electronic Signature(s) Signed: 09/16/2021 4:51:17 PM By: Lorrin Jackson Entered By: Lorrin Jackson on 09/16/2021 10:06:08 -------------------------------------------------------------------------------- Clinic Level of Care Assessment Details Patient Name: Date of Service: Eduardo [name] MES M. 09/16/2021 9:00 A M Medical Record Number: [id number] Patient Account Number: [id number] Date of Birth/Sex: Treating RN: 09-Jan-1956 (65 y.o. Male) Lorrin Jackson Primary Care Yazmin Locher: Vikki Ports Other Clinician: Referring Willene Holian: Treating Verity Gilcrest/Extender: Fransico Michael in Treatment: 0 Clinic Level of Care Assessment Items TOOL 1 Quantity Score X- 1 0 Use when EandM and Procedure is performed on INITIAL visit ASSESSMENTS - Nursing Assessment / Reassessment X- 1 20 General Physical Exam (combine w/ comprehensive assessment (listed just below) when performed on new pt. evals) X- 1 25 Comprehensive Assessment (HX, ROS, Risk Assessments, Wounds Hx, etc.) ASSESSMENTS - Wound and Skin Assessment / Reassessment []  - 0 Dermatologic / Skin Assessment (not related to wound area) ASSESSMENTS - Ostomy and/or Continence Assessment and Care []  - 0 Incontinence Assessment and Management []  - 0 Ostomy Care Assessment and Management (repouching, etc.) PROCESS - Coordination of Care []  - 0 Simple Patient / Family Education for ongoing care X- 1 20 Complex (extensive) Patient / Family Education for ongoing care X- 1 10 Staff obtains Programmer, systems, Records, T Results / Process Orders est []  - 0 Staff telephones HHA, Nursing Homes / Clarify orders / etc []  - 0 Routine Transfer to another Facility (non-emergent condition) []  - 0 Routine Hospital Admission (non-emergent condition) []  - 0 New Admissions / Biomedical engineer / Ordering NPWT Apligraf, etc. , []  - 0 Emergency Hospital Admission (emergent condition) PROCESS -  Special Needs []  - 0 Pediatric / Minor Patient Management []  - 0 Isolation Patient Management []  - 0 Hearing / Language / Visual special needs []  - 0 Assessment of Community assistance (transportation, D/C planning, etc.) []  - 0 Additional assistance / Altered mentation []  - 0 Support Surface(s) Assessment (bed, cushion, seat,  etc.) INTERVENTIONS - Miscellaneous []  - 0 External ear exam []  - 0 Patient Transfer (multiple staff / Civil Service fast streamer / Similar devices) []  - 0 Simple Staple / Suture removal (25 or less) []  - 0 Complex Staple / Suture removal (26 or more) []  - 0 Hypo/Hyperglycemic Management (do not check if billed separately) []  - 0 Ankle / Brachial Index (ABI) - do not check if billed separately Has the patient been seen at the hospital within the last three years: Yes Total Score: 75 Level Of Care: New/Established - Level 2 Electronic Signature(s) Signed: 09/16/2021 4:51:17 PM By: Lorrin Jackson Entered By: Lorrin Jackson on 09/16/2021 10:40:48 -------------------------------------------------------------------------------- Encounter Discharge Information Details Patient Name: Date of Service: Eduardo [name], Eduardo [name] MES M. 09/16/2021 9:00 A M Medical Record Number: [id number] Patient Account Number: [id number] Date of Birth/Sex: Treating RN: 25-May-1956 (65 y.o. Male) Lorrin Jackson Primary Care Dionna Wiedemann: Vikki Ports Other Clinician: Referring Avyn Aden: Treating Tynisa Vohs/Extender: Fransico Michael in Treatment: 0 Encounter Discharge Information Items Post Procedure Vitals Discharge Condition: Stable Temperature (F): 98.3 Ambulatory Status: Ambulatory Pulse (bpm): 76 Discharge Destination: Home Respiratory Rate (breaths/min): 18 Transportation: Private Auto Blood Pressure (mmHg): 134/75 Schedule Follow-up Appointment: Yes Clinical Summary of Care: Provided on 09/16/2021 Form Type Recipient Paper Patient Patient Electronic Signature(s) Signed:  09/16/2021 4:51:17 PM By: Lorrin Jackson Entered By: Lorrin Jackson on 09/16/2021 10:43:04 -------------------------------------------------------------------------------- Lower Extremity Assessment Details Patient Name: Date of Service: Eduardo [name] MES M. 09/16/2021 9:00 A M Medical Record Number: [id number] Patient Account Number: [id number] Date of Birth/Sex: Treating RN: [birth year] (64 y.o. Male) Lorrin Jackson Primary Care Ailen Strauch: Vikki Ports Other Clinician: Referring Avonda Toso: Treating Jermya Dowding/Extender: Fransico Michael in Treatment: 0 Edema Assessment Assessed: Eduardo [name]: No] [Right: Yes] E[Left: dema] [Right: :] Calf Left: Right: Point of Measurement: From Medial Instep 38.5 cm Ankle Left: Right: Point of Measurement: From Medial Instep 23.5 cm Vascular Assessment Pulses: Dorsalis Pedis Palpable: [Right:Yes] Electronic Signature(s) Signed: 09/16/2021 4:51:17 PM By: Lorrin Jackson Entered By: Lorrin Jackson on 09/16/2021 09:53:31 -------------------------------------------------------------------------------- Multi Wound Chart Details Patient Name: Date of Service: Eduardo [name], Eduardo [name] MES M. 09/16/2021 9:00 A M Medical Record Number: [id number] Patient Account Number: [id number] Date of Birth/Sex: Treating RN: [birth year] (64 y.o. Male) Rhae Hammock Primary Care Layni Kreamer: Vikki Ports Other Clinician: Referring Sommer Spickard: Treating Kayce Betty/Extender: Fransico Michael in Treatment: 0 Vital Signs Height(in): 70 Capillary Blood Glucose(mg/dl): 117 Weight(lbs): 274 Pulse(bpm): 60 Body Mass Index(BMI): 31 Blood Pressure(mmHg): 134/75 Temperature(F): 98.3 Respiratory Rate(breaths/min): 18 Photos: [N/A:N/A] Right, Plantar Foot N/A N/A Wound Location: Gradually Appeared N/A N/A Wounding Event: Diabetic Wound/Ulcer of the Lower N/A N/A Primary Etiology: Extremity Cataracts, Congestive Heart Failure, N/A N/A Comorbid  History: Coronary Artery Disease, Hypertension, Myocardial Infarction, Peripheral Arterial Disease, Type II Diabetes, Osteomyelitis, Neuropathy 12/11/2019 N/A N/A Date Acquired: 0 N/A N/A Weeks of Treatment: Open N/A N/A Wound Status: 2x3.5x0.2 N/A N/A Measurements L x W x D (cm) 5.498 N/A N/A A (cm) : rea 1.1 N/A N/A Volume (cm) : 0.00% N/A N/A % Reduction in A rea: 0.00% N/A N/A % Reduction in Volume: 12 Position 1 (o'clock): 2 Maximum Distance 1 (cm): Yes N/A N/A Tunneling: Grade 1 N/A N/A Classification: Medium N/A N/A Exudate A mount: Serosanguineous N/A N/A Exudate Type: red, brown N/A N/A Exudate Color: Distinct, outline attached N/A N/A Wound Margin: Large (67-100%) N/A N/A Granulation A mount: Red N/A N/A Granulation Quality: Small (1-33%) N/A N/A Necrotic A mount: Fat Layer (Subcutaneous Tissue): Yes  N/A N/A Exposed Structures: Fascia: No Tendon: No Muscle: No Joint: No Bone: No None N/A N/A Epithelialization: Debridement - Excisional N/A N/A Debridement: Pre-procedure Verification/Time Out 10:14 N/A N/A Taken: Subcutaneous N/A N/A Tissue Debrided: Skin/Subcutaneous Tissue N/A N/A Level: 7 N/A N/A Debridement A (sq cm): rea Blade, Forceps, Scissors N/A N/A Instrument: Swab N/A N/A Specimen: 1 N/A N/A Number of Specimens Taken: Moderate N/A N/A Bleeding: Silver Nitrate N/A N/A Hemostasis Achieved: Procedure was tolerated well N/A N/A Debridement Treatment Response: 2x3.5x0.2 N/A N/A Post Debridement Measurements L x W x D (cm) 1.1 N/A N/A Post Debridement Volume: (cm) Calloused Periwound N/A N/A Assessment Notes: Debridement N/A N/A Procedures Performed: Treatment Notes Wound #3 (Foot) Wound Laterality: Plantar, Right Cleanser Soap and Water Discharge Instruction: May shower and wash wound with dial antibacterial soap and water prior to dressing change. Peri-Wound Care Topical Primary Dressing KerraCel Ag Gelling  Fiber Dressing, 2x2 in (silver alginate) Discharge Instruction: Apply silver alginate to wound bed as instructed Secondary Dressing Woven Gauze Sponge, Non-Sterile 4x4 in Discharge Instruction: Apply over primary dressing as directed. Optifoam Non-Adhesive Dressing, 4x4 in Discharge Instruction: Apply as foam donut Secured With [employer] Sterile, 4.5x3.1 (in/yd) Discharge Instruction: Secure with Kerlix as directed. Transpore Surgical Tape, 2x10 (in/yd) Discharge Instruction: Secure dressing with tape as directed. Compression Wrap Compression Stockings Add-Ons Electronic Signature(s) Signed: 09/16/2021 4:39:28 PM By: Linton Ham MD Signed: 09/16/2021 4:45:49 PM By: [name] Entered By: Linton Ham on 09/16/2021 11:10:20 -------------------------------------------------------------------------------- Multi-Disciplinary Care Plan Details Patient Name: Date of Service: Eduardo [name], Eduardo [name] MES M. 09/16/2021 9:00 A M Medical Record Number: [id number] Patient Account Number: [id number] Date of Birth/Sex: Treating RN: September 03, 1956 (65 y.o. Male) Lorrin Jackson Primary Care Golda Zavalza: Vikki Ports Other Clinician: Referring Paizleigh Wilds: Treating Kahlie Deutscher/Extender: Fransico Michael in Treatment: 0 Active Inactive Nutrition Nursing Diagnoses: Impaired glucose control: actual or potential Goals: Patient/caregiver verbalizes understanding of need to maintain therapeutic glucose control per primary care physician Date Initiated: 09/16/2021 Target Resolution Date: 10/14/2021 Goal Status: Active Interventions: Assess patient nutrition upon admission and as needed per policy Provide education on elevated blood sugars and impact on wound healing Provide education on nutrition Treatment Activities: Obtain HgA1c : 09/16/2021 Notes: Wound/Skin Impairment Nursing Diagnoses: Impaired tissue integrity Goals: Patient/caregiver will verbalize understanding of skin  care regimen Date Initiated: 09/16/2021 Target Resolution Date: 10/14/2021 Goal Status: Active Ulcer/skin breakdown will have a volume reduction of 30% by week 4 Date Initiated: 09/16/2021 Target Resolution Date: 10/14/2021 Goal Status: Active Interventions: Assess patient/caregiver ability to perform ulcer/skin care regimen upon admission and as needed Assess ulceration(s) every visit Provide education on ulcer and skin care Treatment Activities: Topical wound management initiated : 09/16/2021 Notes: Electronic Signature(s) Signed: 09/16/2021 4:51:17 PM By: Lorrin Jackson Entered By: Lorrin Jackson on 09/16/2021 09:58:27 -------------------------------------------------------------------------------- Pain Assessment Details Patient Name: Date of Service: Eduardo [name] MES M. 09/16/2021 9:00 [place] Record Number: [id number] Patient Account Number: [id number] Date of Birth/Sex: Treating RN: 1956-06-11 (65 y.o. Male) Lorrin Jackson Primary Care Olie Scaffidi: Vikki Ports Other Clinician: Referring Verdell Dykman: Treating Perlita Forbush/Extender: Fransico Michael in Treatment: 0 Active Problems Location of Pain Severity and Description of Pain Patient Has Paino No Site Locations Pain Management and Medication Current Pain Management: Electronic Signature(s) Signed: 09/16/2021 4:51:17 PM By: Lorrin Jackson Entered By: Lorrin Jackson on 09/16/2021 09:56:23 -------------------------------------------------------------------------------- Patient/Caregiver Education Details Patient Name: Date of Service: Eduardo [name] MES M. 11/8/2022andnbsp9:00 A M Medical Record Number: [id number] Patient Account Number:  [id number] Date of Birth/Gender: Treating RN: [birth year] (65 y.o. Male) Lorrin Jackson Primary Care Physician: Vikki Ports Other Clinician: Referring Physician: Treating Physician/Extender: Fransico Michael in Treatment: 0 Education  Assessment Education Provided To: Patient Education Topics Provided Elevated Blood Sugar/ Impact on Healing: Methods: Explain/Verbal, Printed Responses: State content correctly Offloading: Methods: Explain/Verbal, Printed Responses: State content correctly Wound/Skin Impairment: Methods: Explain/Verbal, Printed Responses: State content correctly Electronic Signature(s) Signed: 09/16/2021 4:51:17 PM By: Lorrin Jackson Entered By: Lorrin Jackson on 09/16/2021 09:58:59 -------------------------------------------------------------------------------- Wound Assessment Details Patient Name: Date of Service: Eduardo [name] MES M. 09/16/2021 9:00 A M Medical Record Number: [id number] Patient Account Number: [id number] Date of Birth/Sex: Treating RN: 11/07/1956 (64 y.o. Male) Lorrin Jackson Primary Care Micholas Drumwright: Vikki Ports Other Clinician: Referring Parke Jandreau: Treating Malachi Kinzler/Extender: Fransico Michael in Treatment: 0 Wound Status Wound Number: 3 Primary Diabetic Wound/Ulcer of the Lower Extremity Etiology: Wound Location: Right, Plantar Foot Wound Open Wounding Event: Gradually Appeared Status: Date Acquired: 12/11/2019 Comorbid Cataracts, Congestive Heart Failure, Coronary Artery Disease, Weeks Of Treatment: 0 History: Hypertension, Myocardial Infarction, Peripheral Arterial Disease, Clustered Wound: No Type II Diabetes, Osteomyelitis, Neuropathy Photos Wound Measurements Length: (cm) 2 Width: (cm) 3.5 Depth: (cm) 0.2 Area: (cm) 5.498 Volume: (cm) 1.1 % Reduction in Area: 0% % Reduction in Volume: 0% Epithelialization: None Tunneling: Yes Position (o'clock): 12 Maximum Distance: (cm) 2 Undermining: No Wound Description Classification: Grade 1 Wound Margin: Distinct, outline attached Exudate Amount: Medium Exudate Type: Serosanguineous Exudate Color: red, brown Foul Odor After Cleansing: No Slough/Fibrino Yes Wound Bed Granulation Amount:  Large (67-100%) Exposed Structure Granulation Quality: Red Fascia Exposed: No Necrotic Amount: Small (1-33%) Fat Layer (Subcutaneous Tissue) Exposed: Yes Necrotic Quality: Adherent Slough Tendon Exposed: No Muscle Exposed: No Joint Exposed: No Bone Exposed: No Assessment Notes Calloused Periwound Treatment Notes Wound #3 (Foot) Wound Laterality: Plantar, Right Cleanser Soap and Water Discharge Instruction: May shower and wash wound with dial antibacterial soap and water prior to dressing change. Peri-Wound Care Topical Primary Dressing KerraCel Ag Gelling Fiber Dressing, 2x2 in (silver alginate) Discharge Instruction: Apply silver alginate to wound bed as instructed Secondary Dressing Woven Gauze Sponge, Non-Sterile 4x4 in Discharge Instruction: Apply over primary dressing as directed. Optifoam Non-Adhesive Dressing, 4x4 in Discharge Instruction: Apply as foam donut Secured With [employer] Sterile, 4.5x3.1 (in/yd) Discharge Instruction: Secure with Kerlix as directed. Transpore Surgical Tape, 2x10 (in/yd) Discharge Instruction: Secure dressing with tape as directed. Compression Wrap Compression Stockings Add-Ons Electronic Signature(s) Signed: 09/16/2021 4:51:17 PM By: Lorrin Jackson Entered By: Lorrin Jackson on 09/16/2021 10:04:37 -------------------------------------------------------------------------------- Vitals Details Patient Name: Date of Service: Eduardo [name], Eduardo [name] MES M. 09/16/2021 9:00 A M Medical Record Number: [id number] Patient Account Number: [id number] Date of Birth/Sex: Treating RN: [birth year] (65 y.o. Male) Rhae Hammock Primary Care Georgie Haque: Vikki Ports Other Clinician: Referring Ohm Dentler: Treating Lanetta Figuero/Extender: Fransico Michael in Treatment: 0 Vital Signs Time Taken: 09:31 Temperature (F): 98.3 Height (in): 70 Pulse (bpm): 76 Source: Stated Respiratory Rate (breaths/min): 18 Weight (lbs): 274 Blood Pressure  (mmHg): 134/75 Source: Stated Capillary Blood Glucose (mg/dl): 117 Body Mass Index (BMI): 39.3 Reference Range: 80 - 120 mg / dl Electronic Signature(s) Signed: 09/17/2021 1:53:08 PM By: Sandre Kitty Entered By: Sandre Kitty on 09/16/2021 09:31:55

## 2021-09-17 NOTE — Progress Notes (Signed)
Subjective: 65 year old male presents the office today for follow-up evaluation of wound on the right foot.  Has been using the Prisma but he thinks has not been helping.  No increasing swelling or redness.  No purulence.  He is not sure how the wound is actually looking at it is difficult to see it.  Denies any fevers or chills.    Objective: AAO x3, NAD DP/PT pulses 1/4 bilaterally, CRT less than 3 seconds Plantar aspect of right foot just proximal to the metatarsal head laterally is hyperkeratotic lesion with dried blood.  There is central granular wound with some fibrotic tissue present as well.  After debridement the wound base is granular.  There is mild macerated tissue on the periwound.  There is no probing to bone, exposed bone or tendon.  No undermining or tunneling.  Just proximal to this area along the bony prominence is a preulcerative callus as well but there is no skin breakdown. No pain with calf compression, swelling, warmth, erythema  Assessment: Ulceration right foot  Plan: -All treatment options discussed with the patient including all alternatives, risks, complications.  -Debrided the hyperkeratotic tissue today reveals underlying ulceration.  I debrided the wound down to healthy, granular tissue to help facilitate wound healing and to remove any nonviable devitalized tissue with a #312 blade scalpel.  No blood loss.  Tolerated the procedure well.  We will switch to using Betadine wet-to-dry for now. -Referral to the wound care center. -Continue offloading -He has been evaluated by cardiology for her circulation.  Return in about 2 weeks (around 09/29/2021).  Trula Slade DPM

## 2021-09-19 LAB — AEROBIC/ANAEROBIC CULTURE W GRAM STAIN (SURGICAL/DEEP WOUND)

## 2021-09-23 ENCOUNTER — Encounter (HOSPITAL_BASED_OUTPATIENT_CLINIC_OR_DEPARTMENT_OTHER): Payer: Medicare Other | Admitting: Internal Medicine

## 2021-09-23 ENCOUNTER — Other Ambulatory Visit: Payer: Self-pay

## 2021-09-23 DIAGNOSIS — E11621 Type 2 diabetes mellitus with foot ulcer: Secondary | ICD-10-CM | POA: Diagnosis not present

## 2021-09-25 NOTE — Progress Notes (Signed)
ADYNN, CASERES ([id number]) Visit Report for 09/23/2021 Debridement Details Patient Name: Date of Service: Eduardo [name] MES M. 09/23/2021 9:00 A M Medical Record Number: [id number] Patient Account Number: [id number] Date of Birth/Sex: Treating RN: Apr 06, 1956 (65 y.o. Burnadette Pop, Lauren Primary Care Provider: Vikki Ports Other Clinician: Referring Provider: Treating Provider/Extender: Rudean Haskell, Artist Pais Weeks in Treatment: 1 Debridement Performed for Assessment: Wound #3 Right,Plantar Foot Performed By: Physician Ricard Dillon., MD Debridement Type: Debridement Severity of Tissue Pre Debridement: Fat layer exposed Level of Consciousness (Pre-procedure): Awake and Alert Pre-procedure Verification/Time Out Yes - 09:19 Taken: Start Time: 09:20 Pain Control: Other : Benzocaine T Area Debrided (L x W): otal 1.3 (cm) x 1.5 (cm) = 1.95 (cm) Tissue and other material debrided: Non-Viable, Callus, Subcutaneous Level: Skin/Subcutaneous Tissue Debridement Description: Excisional Instrument: Curette Bleeding: Minimum Hemostasis Achieved: Pressure End Time: 09:27 Response to Treatment: Procedure was tolerated well Level of Consciousness (Post- Awake and Alert procedure): Post Debridement Measurements of Total Wound Length: (cm) 1.3 Width: (cm) 1.5 Depth: (cm) 0.5 Volume: (cm) 0.766 Character of Wound/Ulcer Post Debridement: Stable Severity of Tissue Post Debridement: Fat layer exposed Post Procedure Diagnosis Same as Pre-procedure Electronic Signature(s) Signed: 09/23/2021 4:37:23 PM By: Linton Ham MD Signed: 09/25/2021 5:03:29 PM By: [name] Entered By: Linton Ham on 09/23/2021 09:45:31 -------------------------------------------------------------------------------- HPI Details Patient Name: Date of Service: Eduardo [name], Eduardo [name] MES M. 09/23/2021 9:00 A M Medical Record Number: [id number] Patient Account Number: [id number] Date of Birth/Sex: Treating  RN: Aug 08, 1956 (65 y.o. Erie Noe Primary Care Provider: Vikki Ports Other Clinician: Referring Provider: Treating Provider/Extender: Rudean Haskell, Artist Pais Weeks in Treatment: 1 History of Present Illness HPI Description: ADMISSION 04/28/2019 Pleasant 65 year old man who is a type II diabetic with PAD and neuropathy. He developed a callused area with an open area over the right fifth metatarsal head that was noted by his girlfriend in April. He was seen by Dr. Einar Gip surrounding this time for known PAD. He had arteriogram on 02/28/2019 that showed a right SFA occlusion that was open successfully post stenting. He is due to have another angiogram next week to look at the left side. This is also by Dr. Einar Gip. The patient has applied various dressings to the right foot including alginate and more recently Silvadene cream. He has recently completed a course of Augmentin. He also has severe neuropathy. I have reviewed the x-rays done in Dr. Leigh Aurora office on [facility] health link. I agree there does not seem to be any plain x-ray suggestion of osteomyelitis in the right fifth met head About a month ago he also noted an open area on the left foot. This is not nearly as worrisome as the right side at this point. 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 History of peritoneal abscess, Most recent ABIs done by Dr. Einar Gip on 04/07/2019 showed monophasic waveforms on the left biphasic waveforms on the right. ABIs were 1.0 bilaterally 05/04/19; patient came in today with increasing pain in the right foot. Bone scraping admitted last week showed Enterococcus faecalis he was on Augmentin until sometime last week, amoxicillin is the treatment of choice for enterococcus nevertheless I'll restart this.  His MRI is booked for 6/30. He saw Dr. Kris Hartmann. He underwent angiography. Please loosely placed stents in the common femoral and right  proximal SFA were widely patent. The left side showed iliac artery and femoral arteries to have very mild disease. 2 vessel runoff in the left leg his left AT is occluded. 7/2; patient's MRI as feared was not very good. Cellulitis and myositis. 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 Also suspected septic arthritis at the joint. Bone scraping I did when he first came in showed enterococcus faecalis he has been on on Augmentin but he finished this. 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 Sharol Given. He apparently tolerated this well and sees him in the afternoon. He also has the area on the lateral part of the left fifth met head. We have been using silver alginate to this. 7/31; we did not look at the right fifth ray amputation site is been seen by orthopedics this afternoon. He has a small area on the left lateral fifth met head. Undermining laterally. We have been using silver alginate. 8/14-Patient comes a 2 weeks with left lateral fifth toe met head wound which is looking better continue to use silver alginate the right fifth ray amputation is handled by Dr. Sharol Given and he placed him on doxycycline this week 8/28; wound on the lateral left fifth metatarsal head has closed over. He still has an open wound on the right foot which is a surgical wound being managed by Dr. Sharol Given READMISSION 09/16/2021 [name] is now a 65 year old man with type 2 diabetes. He was in the clinic in 2020 with an area on the left fifth metatarsal head and also a surgical wound on the right foot. My notes state that the latter was followed by Dr. Sharol Given and ultimately ended up with a right fifth toe amputation. More recently he has been followed by Dr. Earleen Newport at [facility] for an area on  the right lateral foot. This goes back in the summertime. The patient tells me that he has had this open on and off for about a year. Will cover with callus and then reopen. X-ray apparently on 9/27 has been negative. He has been using a Pegasys shoe. He has undergone debridements by Dr. Jacqualyn Posey . Was started on collagen roughly 2 weeks ago. He had a course of doxycycline in October and was started yesterday on another course of doxycycline. The patient has a history of PAD followed by Dr. Einar Gip Past medical history includes congestive heart failure, coronary artery disease status post MI in December 2021, ischemic cardiomyopathy he has an LVAD. Type 2 diabetes with good control with an A1c of 6.5. According to the patient he has had stents placed by Dr. Einar Gip x3 in the right leg. Most recent ABIs on 08/14/2021 showed a ABI of 1 on the right and 0.84 on the left. As mentioned apparent stents in the right leg although I have not looked over these records 09/23/2021; patient with an area on his left lateral foot. He has had a previous right toe perhaps partial ray amputation. I used silver alginate last week he is using a healing sandal. Attempting to limit his activity. Culture I did last time of this showed Proteus. Not plated to the doxycycline he was on. It is possible the doxycycline would cover this but I am going to give him an additional 7 days of Cipro to replace the doxycycline. More reliable. I have reviewed his past vascular history. He follows with Dr. Einar Gip. He does have  indeed a stent in the right SFA. Last arterial studies I can see where from 01/08/2021 at which time he had an ABI in the right of 0.72 monophasic waveforms. He has had an angiogram as well which I pasted below. I think he is felt to have sufficient perfusion to heal this wound Peripheral arteriogram 02/28/2019: Pelvic aortogram with limited bifemoral arteriogram revealed patent distal abdominal aorta without aneurysm,  patent, bilateral iliac and common femoral vessels. Right SFA is occluded in the ostium and reconstitutes outside of the Hunter's canal. Below the right knee there is two-vessel runoff, AT is occluded. Left leg was not studied beyond left common femoral artery. Successful PTA and stenting of right SFA, prolonged procedure, difficult procedure with use of multiple balloons, guidewires and catheters. 100% stenosis reduced to 0% with implantation of 3 overlapping 6.0 x 120 x2; 6.0 x 100 mm Eluvia DES. 180 mL contrast utilized. Peripheral arteriogram 05/02/2019: Right common femoral artery and right proximal SFA previously placed stent widely patent. Right iliac artery shows mild disease. Left iliac artery and left femoral arteries show very mild disease. There is two-vessel runoff in the left leg, left AT is occluded. Intermediate stenosis noted in the left distal and proximal SFA, pressure pullback reveals no significant gradient. Electronic Signature(s) Signed: 09/23/2021 4:37:23 PM By: Linton Ham MD Entered By: Linton Ham on 09/23/2021 09:51:16 -------------------------------------------------------------------------------- Physical Exam Details Patient Name: Date of Service: Eduardo [name] MES M. 09/23/2021 9:00 A M Medical Record Number: [id number] Patient Account Number: [id number] Date of Birth/Sex: Treating RN: October 27, 1956 (65 y.o. Erie Noe Primary Care Provider: Vikki Ports Other Clinician: Referring Provider: Treating Provider/Extender: Rudean Haskell, Eve A Weeks in Treatment: 1 Constitutional Sitting or standing Blood Pressure is within target range for patient.. Pulse regular and within target range for patient.Marland Kitchen Respirations regular, non-labored and within target range.. Temperature is normal and within the target range for the patient.Marland Kitchen Appears in no distress. Notes Wound exam; the area on the right lateral foot slightly smaller. Previous toe/partial  ray amputation. I used a #5 curette to remove thick callus and thick raised senescent edges from around the wound margin hemostasis with silver nitrate and a pressure dressing Electronic Signature(s) Signed: 09/23/2021 4:37:23 PM By: Linton Ham MD Entered By: Linton Ham on 09/23/2021 09:52:40 -------------------------------------------------------------------------------- Physician Orders Details Patient Name: Date of Service: Eduardo [name] MES M. 09/23/2021 9:00 A M Medical Record Number: [id number] Patient Account Number: [id number] Date of Birth/Sex: Treating RN: 01-30-1956 (65 y.o. Marcheta Grammes Primary Care Provider: Rita Ohara A Other Clinician: Referring Provider: Treating Provider/Extender: Rudean Haskell, Newt Lukes in Treatment: 1 Verbal / Phone Orders: No Diagnosis Coding ICD-10 Coding Code Description E11.621 Type 2 diabetes mellitus with foot ulcer L97.518 Non-pressure chronic ulcer of other part of right foot with other specified severity E11.51 Type 2 diabetes mellitus with diabetic peripheral angiopathy without gangrene Follow-up Appointments ppointment in 2 weeks. - -with Dr. Dellia Nims Return A -Pick up new prescription from pharmacy -Wear pants with loose legs for possible TCC next visit Other: - Halo=Supplies Bathing/ Shower/ Hygiene May shower and wash wound with soap and water. Off-Loading Open toe surgical shoe to: - with PegAssist insert Additional Orders / Instructions Follow Nutritious Diet - -High Protein Diet -Keep monitoring blood sugar Wound Treatment Wound #3 - Foot Wound Laterality: Plantar, Right Cleanser: Soap and Water 1 x Per Day/30 Days Discharge Instructions: May shower and wash wound with dial antibacterial soap and water prior to dressing  change. Prim Dressing: KerraCel Ag Gelling Fiber Dressing, 2x2 in (silver alginate) 1 x Per Day/30 Days ary Discharge Instructions: Apply silver alginate to wound bed as  instructed Secondary Dressing: Woven Gauze Sponge, Non-Sterile 4x4 in 1 x Per Day/30 Days Discharge Instructions: Apply over primary dressing as directed. Secondary Dressing: Optifoam Non-Adhesive Dressing, 4x4 in 1 x Per Day/30 Days Discharge Instructions: Apply as foam donut Secured With: Kerlix Roll Sterile, 4.5x3.1 (in/yd) 1 x Per Day/30 Days Discharge Instructions: Secure with Kerlix as directed. Secured With: Transpore Surgical Tape, 2x10 (in/yd) 1 x Per Day/30 Days Discharge Instructions: Secure dressing with tape as directed. Patient Medications llergies: shellfish derived, latex, testosterone, adhesive tape A Notifications Medication Indication Start End wound infection 09/23/2021 Cipro DOSE oral 500 mg tablet - 1 tablet oral bid for 7 days Electronic Signature(s) Signed: 09/23/2021 9:42:14 AM By: Linton Ham MD Entered By: Linton Ham on 09/23/2021 09:42:13 -------------------------------------------------------------------------------- Problem List Details Patient Name: Date of Service: Eduardo [name], Eduardo [name] MES M. 09/23/2021 9:00 A M Medical Record Number: [id number] Patient Account Number: [id number] Date of Birth/Sex: Treating RN: 1956-03-27 (65 y.o. Marcheta Grammes Primary Care Provider: Vikki Ports Other Clinician: Referring Provider: Treating Provider/Extender: Rudean Haskell, Newt Lukes in Treatment: 1 Active Problems ICD-10 Encounter Code Description Active Date MDM Diagnosis E11.621 Type 2 diabetes mellitus with foot ulcer 09/16/2021 No Yes L97.518 Non-pressure chronic ulcer of other part of right foot with other specified 09/16/2021 No Yes severity E11.51 Type 2 diabetes mellitus with diabetic peripheral angiopathy without gangrene 09/16/2021 No Yes Inactive Problems Resolved Problems Electronic Signature(s) Signed: 09/23/2021 4:37:23 PM By: Linton Ham MD Entered By: Linton Ham on 09/23/2021  09:44:49 -------------------------------------------------------------------------------- Progress Note Details Patient Name: Date of Service: Eduardo [name] MES M. 09/23/2021 9:00 A M Medical Record Number: [id number] Patient Account Number: [id number] Date of Birth/Sex: Treating RN: 06/08/1956 (65 y.o. Erie Noe Primary Care Provider: Vikki Ports Other Clinician: Referring Provider: Treating Provider/Extender: Rudean Haskell, Eve A Weeks in Treatment: 1 Subjective History of Present Illness (HPI) ADMISSION 04/28/2019 Pleasant 65 year old man who is a type II diabetic with PAD and neuropathy. He developed a callused area with an open area over the right fifth metatarsal head that was noted by his girlfriend in April. He was seen by Dr. Einar Gip surrounding this time for known PAD. He had arteriogram on 02/28/2019 that showed a right SFA occlusion that was open successfully post stenting. He is due to have another angiogram next week to look at the left side. This is also by Dr. Einar Gip. The patient has applied various dressings to the right foot including alginate and more recently Silvadene cream. He has recently completed a course of Augmentin. He also has severe neuropathy. I have reviewed the x-rays done in Dr. Leigh Aurora office on [facility] health link. I agree there does not seem to be any plain x-ray suggestion of osteomyelitis in the right fifth met head About a month ago he also noted an open area on the left foot. This is not nearly as worrisome as the right side at this point. 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 History of peritoneal abscess, Most recent ABIs done by Dr. Einar Gip on 04/07/2019 showed monophasic waveforms on the left biphasic waveforms on the right. ABIs were  1.0 bilaterally 05/04/19; patient came in  today with increasing pain in the right foot. Bone scraping admitted last week showed Enterococcus faecalis he was on Augmentin until sometime last week, amoxicillin is the treatment of choice for enterococcus nevertheless I'll restart this. His MRI is booked for 6/30. He saw Dr. Kris Hartmann. He underwent angiography. Please loosely placed stents in the common femoral and right proximal SFA were widely patent. The left side showed iliac artery and femoral arteries to have very mild disease. 2 vessel runoff in the left leg his left AT is occluded. 7/2; patient's MRI as feared was not very good. Cellulitis and myositis. 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 Also suspected septic arthritis at the joint. Bone scraping I did when he first came in showed enterococcus faecalis he has been on on Augmentin but he finished this. 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 Sharol Given. He apparently tolerated this well and sees him in the afternoon. He also has the area on the lateral part of the left fifth met head. We have been using silver alginate to this. 7/31; we did not look at the right fifth ray amputation site is been seen by orthopedics this afternoon. He has a small area on the left lateral fifth met head. Undermining laterally. We have been using silver alginate. 8/14-Patient comes a 2 weeks with left lateral fifth toe met head wound which is looking better continue to use silver alginate the right fifth ray amputation is handled by Dr. Sharol Given and he placed him on doxycycline this week 8/28; wound on the lateral left fifth metatarsal head has closed over. He still has an open wound on the right foot which is a surgical wound being managed by Dr. Sharol Given READMISSION 09/16/2021 Mr. Eaddy is now a 65 year old man with type 2 diabetes. He was in the clinic in 2020 with an area  on the left fifth metatarsal head and also a surgical wound on the right foot. My notes state that the latter was followed by Dr. Sharol Given and ultimately ended up with a right fifth toe amputation. More recently he has been followed by Dr. Earleen Newport at [facility] for an area on the right lateral foot. This goes back in the summertime. The patient tells me that he has had this open on and off for about a year. Will cover with callus and then reopen. X-ray apparently on 9/27 has been negative. He has been using a Pegasys shoe. He has undergone debridements by Dr. Jacqualyn Posey . Was started on collagen roughly 2 weeks ago. He had a course of doxycycline in October and was started yesterday on another course of doxycycline. The patient has a history of PAD followed by Dr. Einar Gip Past medical history includes congestive heart failure, coronary artery disease status post MI in December 2021, ischemic cardiomyopathy he has an LVAD. Type 2 diabetes with good control with an A1c of 6.5. According to the patient he has had stents placed by Dr. Einar Gip x3 in the right leg. Most recent ABIs on 08/14/2021 showed a ABI of 1 on the right and 0.84 on the left. As mentioned apparent stents in the right leg although I have not looked over these records 09/23/2021; patient with an area on his left lateral foot. He has had a previous right toe perhaps partial ray amputation. I used silver alginate last week he is using a healing sandal. Attempting to limit his activity.  Culture I did last time of this showed Proteus. Not plated to the doxycycline he was on. It is possible the doxycycline would cover this but I am going to give him an additional 7 days of Cipro to replace the doxycycline. More reliable. I have reviewed his past vascular history. He follows with Dr. Einar Gip. He does have indeed a stent in the right SFA. Last arterial studies I can see where from 01/08/2021 at which time he had an ABI in the right of 0.72 monophasic  waveforms. He has had an angiogram as well which I pasted below. I think he is felt to have sufficient perfusion to heal this wound Peripheral arteriogram 02/28/2019: Pelvic aortogram with limited bifemoral arteriogram revealed patent distal abdominal aorta without aneurysm, patent, bilateral iliac and common femoral vessels. Right SFA is occluded in the ostium and reconstitutes outside of the Hunter's canal. Below the right knee there is two-vessel runoff, AT is occluded. Left leg was not studied beyond left common femoral artery. Successful PTA and stenting of right SFA, prolonged procedure, difficult procedure with use of multiple balloons, guidewires and catheters. 100% stenosis reduced to 0% with implantation of 3 overlapping 6.0 x 120 x2; 6.0 x 100 mm Eluvia DES. 180 mL contrast utilized. Peripheral arteriogram 05/02/2019: Right common femoral artery and right proximal SFA previously placed stent widely patent. Right iliac artery shows mild disease. Left iliac artery and left femoral arteries show very mild disease. There is two-vessel runoff in the left leg, left AT is occluded. Intermediate stenosis noted in the left distal and proximal SFA, pressure pullback reveals no significant gradient. Objective Constitutional Sitting or standing Blood Pressure is within target range for patient.. Pulse regular and within target range for patient.Marland Kitchen Respirations regular, non-labored and within target range.. Temperature is normal and within the target range for the patient.Marland Kitchen Appears in no distress. Vitals Time Taken: 9:02 AM, Height: 70 in, Weight: 274 lbs, BMI: 39.3, Temperature: 98.3 F, Pulse: 88 bpm, Respiratory Rate: 16 breaths/min, Blood Pressure: 145/79 mmHg, Capillary Blood Glucose: 118 mg/dl. General Notes: Glucose stated by patient General Notes: Wound exam; the area on the right lateral foot slightly smaller. Previous toe/partial ray amputation. I used a #5 curette to remove thick  callus and thick raised senescent edges from around the wound margin hemostasis with silver nitrate and a pressure dressing Integumentary (Hair, Skin) Wound #3 status is Open. Original cause of wound was Gradually Appeared. The date acquired was: 12/11/2019. The wound has been in treatment 1 weeks. The wound is located on the [place]. The wound measures 1.3cm length x 1.5cm width x 0.5cm depth; 1.532cm^2 area and 0.766cm^3 volume. There is Fat Layer (Subcutaneous Tissue) exposed. There is a medium amount of serosanguineous drainage noted. The wound margin is distinct with the outline attached to the wound base. There is large (67-100%) red granulation within the wound bed. There is a small (1-33%) amount of necrotic tissue within the wound bed including Adherent Slough. Assessment Active Problems ICD-10 Type 2 diabetes mellitus with foot ulcer Non-pressure chronic ulcer of other part of right foot with other specified severity Type 2 diabetes mellitus with diabetic peripheral angiopathy without gangrene Procedures Wound #3 Pre-procedure diagnosis of Wound #3 is a Diabetic Wound/Ulcer of the Lower Extremity located on the Right,Plantar Foot .Severity of Tissue Pre Debridement is: Fat layer exposed. There was a Excisional Skin/Subcutaneous Tissue Debridement with a total area of 1.95 sq cm performed by Ricard Dillon., MD. With the following instrument(s): Curette to  remove Non-Viable tissue/material. Material removed includes Callus and Subcutaneous Tissue and after achieving pain control using Other (Benzocaine). No specimens were taken. A time out was conducted at 09:19, prior to the start of the procedure. A Minimum amount of bleeding was controlled with Pressure. The procedure was tolerated well. Post Debridement Measurements: 1.3cm length x 1.5cm width x 0.5cm depth; 0.766cm^3 volume. Character of Wound/Ulcer Post Debridement is stable. Severity of Tissue Post Debridement is:  Fat layer exposed. Post procedure Diagnosis Wound #3: Same as Pre-Procedure Plan Follow-up Appointments: Return Appointment in 2 weeks. - -with Dr. Dellia Nims -Pick up new prescription from pharmacy -Wear pants with loose legs for possible TCC next visit Other: - Halo=Supplies Bathing/ Shower/ Hygiene: May shower and wash wound with soap and water. Off-Loading: Open toe surgical shoe to: - with PegAssist insert Additional Orders / Instructions: Follow Nutritious Diet - -High Protein Diet -Keep monitoring blood sugar The following medication(s) was prescribed: Cipro oral 500 mg tablet 1 tablet oral bid for 7 days for wound infection starting 09/23/2021 WOUND #3: - Foot Wound Laterality: Plantar, Right Cleanser: Soap and Water 1 x Per Day/30 Days Discharge Instructions: May shower and wash wound with dial antibacterial soap and water prior to dressing change. Prim Dressing: KerraCel Ag Gelling Fiber Dressing, 2x2 in (silver alginate) 1 x Per Day/30 Days ary Discharge Instructions: Apply silver alginate to wound bed as instructed Secondary Dressing: Woven Gauze Sponge, Non-Sterile 4x4 in 1 x Per Day/30 Days Discharge Instructions: Apply over primary dressing as directed. Secondary Dressing: Optifoam Non-Adhesive Dressing, 4x4 in 1 x Per Day/30 Days Discharge Instructions: Apply as foam donut Secured With: Kerlix Roll Sterile, 4.5x3.1 (in/yd) 1 x Per Day/30 Days Discharge Instructions: Secure with Kerlix as directed. Secured With: Transpore Surgical T ape, 2x10 (in/yd) 1 x Per Day/30 Days Discharge Instructions: Secure dressing with tape as directed. 1. Ciprofloxacin 500 twice daily x7 days for the Proteus 2. Debridement as noted 3 still using silver alginate. 4. Plan for a total contact cast in 2 weeks 5. His revascularization seems satisfactory and peripheral pulses are palpable in the right foot. I do not think there is an issue here Electronic Signature(s) Signed: 09/23/2021 4:37:23 PM  By: Linton Ham MD Entered By: Linton Ham on 09/23/2021 09:54:54 -------------------------------------------------------------------------------- SuperBill Details Patient Name: Date of Service: Eduardo [name], Eduardo [name] MES M. 09/23/2021 Medical Record Number: [id number] Patient Account Number: [id number] Date of Birth/Sex: Treating RN: [birth year] (65 y.o. 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 2 diabetes mellitus with diabetic peripheral angiopathy without gangrene Facility Procedures CPT4 Code: [id number] Description: 32122 - DEB SUBQ TISSUE 20 SQ CM/< ICD-10 Diagnosis Description L97.518 Non-pressure chronic ulcer of other part of right foot with other specified sev Modifier: erity Quantity: 1 Physician Procedures : CPT4 Code Description Modifier [id number] - WC PHYS SUBQ TISS 20 SQ CM ICD-10 Diagnosis Description L97.518 Non-pressure chronic ulcer of other part of right foot with other specified severity Quantity: 1 Electronic Signature(s) Signed: 09/23/2021 4:37:23 PM By: Linton Ham MD Entered By: Linton Ham on 09/23/2021 09:55:10

## 2021-09-29 ENCOUNTER — Ambulatory Visit: Payer: Medicare Other | Admitting: Podiatry

## 2021-10-07 ENCOUNTER — Other Ambulatory Visit: Payer: Self-pay | Admitting: Cardiology

## 2021-10-07 ENCOUNTER — Other Ambulatory Visit: Payer: Self-pay

## 2021-10-07 ENCOUNTER — Encounter (HOSPITAL_BASED_OUTPATIENT_CLINIC_OR_DEPARTMENT_OTHER): Payer: Medicare Other | Admitting: Internal Medicine

## 2021-10-07 DIAGNOSIS — E11621 Type 2 diabetes mellitus with foot ulcer: Secondary | ICD-10-CM | POA: Diagnosis not present

## 2021-10-07 NOTE — Progress Notes (Addendum)
LAQUINCY, EASTRIDGE ([id number]) Visit Report for 10/07/2021 HPI Details Patient Name: Date of Service: [name] 10/07/2021 8:30 A M Medical Record Number: [id number] Patient Account Number: [id number] Date of Birth/Sex: Treating RN: [birth year] (65 y.o. 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 He developed a callused area with an open area over the right fifth metatarsal head that was noted by his girlfriend in April. He was seen by Dr. Einar Gip surrounding this time for known PAD. He had arteriogram on 02/28/2019 that showed a right SFA occlusion that was open successfully post stenting. He is due to have another angiogram next week to look at the left side. This is also by Dr. Einar Gip. The patient has applied various dressings to the right foot including alginate and more recently Silvadene cream. He has recently completed a course of Augmentin. He also has severe neuropathy. I have reviewed the x-rays done in Dr. Leigh Aurora office on [facility] health link. I agree there does not seem to be any plain x-ray suggestion of osteomyelitis in the right fifth met head About a month ago he also noted an open area on the left foot. This is not nearly as worrisome as the right side at this point. 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 History of peritoneal abscess, Most recent ABIs done by Dr. Einar Gip on 04/07/2019 showed monophasic waveforms on the left biphasic waveforms on the right. ABIs were 1.0 bilaterally 05/04/19; patient came in today  with increasing pain in the right foot. Bone scraping admitted last week showed Enterococcus faecalis he was on Augmentin until sometime last week, amoxicillin is the treatment of choice for enterococcus nevertheless I'll restart this. His MRI is booked for 6/30. He saw Dr. Kris Hartmann. He underwent angiography. Please loosely placed stents in the common femoral and right proximal SFA were widely patent. The left side showed iliac artery and femoral arteries to have very mild disease. 2 vessel runoff in the left leg his left AT is occluded. 7/2; patient's MRI as feared was not very good. Cellulitis and myositis. 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 Also suspected septic arthritis at the joint. Bone scraping I did when he first came in showed enterococcus faecalis he has been on on Augmentin but he finished this. 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 Sharol Given. He apparently tolerated this well and sees him in the afternoon. He also has the area on the lateral part of the left fifth met head. We have been using silver alginate to this. 7/31; we did not look at the right fifth ray amputation site is been seen by orthopedics this afternoon. He has a small area on the left lateral fifth met head. Undermining laterally. We have been using silver alginate. 8/14-Patient comes a 2 weeks with left lateral fifth toe met head wound which is looking better continue to use silver alginate the right fifth ray amputation is handled by Dr. Sharol Given and he placed him on doxycycline this week 8/28; wound on the lateral left  fifth metatarsal head has closed over. He still has an open wound on the right foot which is a surgical wound being managed by Dr. Sharol Given READMISSION 09/16/2021 Mr. Gerling is now a 65 year old man with type 2 diabetes. He was in the clinic in 2020 with an area on the left fifth metatarsal head and also a  surgical wound on the right foot. My notes state that the latter was followed by Dr. Sharol Given and ultimately ended up with a right fifth toe amputation. More recently he has been followed by Dr. Earleen Newport at [facility] for an area on the right lateral foot. This goes back in the summertime. The patient tells me that he has had this open on and off for about a year. Will cover with callus and then reopen. X-ray apparently on 9/27 has been negative. He has been using a Pegasys shoe. He has undergone debridements by Dr. Jacqualyn Posey . Was started on collagen roughly 2 weeks ago. He had a course of doxycycline in October and was started yesterday on another course of doxycycline. The patient has a history of PAD followed by Dr. Einar Gip Past medical history includes congestive heart failure, coronary artery disease status post MI in December 2021, ischemic cardiomyopathy he has an LVAD. Type 2 diabetes with good control with an A1c of 6.5. According to the patient he has had stents placed by Dr. Einar Gip x3 in the right leg. Most recent ABIs on 08/14/2021 showed a ABI of 1 on the right and 0.84 on the left. As mentioned apparent stents in the right leg although I have not looked over these records 09/23/2021; patient with an area on his left lateral foot. He has had a previous right toe perhaps partial ray amputation. I used silver alginate last week he is using a healing sandal. Attempting to limit his activity. Culture I did last time of this showed Proteus. Not plated to the doxycycline he was on. It is possible the doxycycline would cover this but I am going to give him an additional 7 days of Cipro to replace the doxycycline. More reliable. I have reviewed his past vascular history. He follows with Dr. Einar Gip. He does have indeed a stent in the right SFA. Last arterial studies I can see where from 01/08/2021 at which time he had an ABI in the right of 0.72 monophasic waveforms. He has had an angiogram as well  which I pasted below. I think he is felt to have sufficient perfusion to heal this wound Peripheral arteriogram 02/28/2019: Pelvic aortogram with limited bifemoral arteriogram revealed patent distal abdominal aorta without aneurysm, patent, bilateral iliac and common femoral vessels. Right SFA is occluded in the ostium and reconstitutes outside of the Hunter's canal. Below the right knee there is two-vessel runoff, AT is occluded. Left leg was not studied beyond left common femoral artery. Successful PTA and stenting of right SFA, prolonged procedure, difficult procedure with use of multiple balloons, guidewires and catheters. 100% stenosis reduced to 0% with implantation of 3 overlapping 6.0 x 120 x2; 6.0 x 100 mm Eluvia DES. 180 mL contrast utilized. Peripheral arteriogram 05/02/2019: Right common femoral artery and right proximal SFA previously placed stent widely patent. Right iliac artery shows mild disease. Left iliac artery and left femoral arteries show very mild disease. There is two-vessel runoff in the left leg, left AT is occluded. Intermediate stenosis noted in the left distal and proximal SFA, pressure pullback reveals no significant gradient. 11/29; patient comes in today with  improvement in his original wound on the right lateral plantar foot. However he has a new wound just below it small and superficial. The patient states that the wound opened yesterday. We have been using silver alginate. He completed the ciprofloxacin I gave him last time Electronic Signature(s) Signed: 10/07/2021 5:06:47 PM By: Linton Ham MD Entered By: Linton Ham on 10/07/2021 09:07:07 -------------------------------------------------------------------------------- Physical Exam Details Patient Name: Date of Service: [name] 10/07/2021 8:30 A M Medical Record Number: [id number] Patient Account Number: [id number] Date of Birth/Sex: Treating RN: 05/16/1956 (65 y.o. [name] Primary Care Provider: Vikki Ports Other Clinician: Referring Provider: Treating Provider/Extender: Rudean Haskell, Eve A Weeks in Treatment: 3 Constitutional Sitting or standing Blood Pressure is within target range for patient.. Pulse regular and within target range for patient.Marland Kitchen Respirations regular, non-labored and within target range.. Temperature is normal and within the target range for the patient.Marland Kitchen Appears in no distress. Notes Wound exam; right lateral foot. The area which is a small linear valley looks smaller to me. Surface of this looks dry but otherwise healthy. There is no evidence of infection He has a new area proximally towards the heel also on the lateral foot this is superficial small. No evidence of infection in either area Electronic Signature(s) Signed: 10/07/2021 5:06:47 PM By: Linton Ham MD Entered By: Linton Ham on 10/07/2021 09:06:00 -------------------------------------------------------------------------------- Physician Orders Details Patient Name: Date of Service: [name] 10/07/2021 8:30 A M Medical Record Number: [id number] Patient Account Number: [id number] Date of Birth/Sex: Treating RN: 1956-05-09 (65 y.o. Marcheta Grammes Primary Care Provider: Rita Ohara A Other Clinician: Referring Provider: Treating Provider/Extender: Rudean Haskell, Newt Lukes in Treatment: 3 Verbal / Phone Orders: No Diagnosis Coding ICD-10 Coding Code Description E11.621 Type 2 diabetes mellitus with foot ulcer L97.518 Non-pressure chronic ulcer of other part of right foot with other specified severity E11.51 Type 2 diabetes mellitus with diabetic peripheral angiopathy without gangrene Follow-up Appointments ppointment in 1 week. - -with Dr. Dellia Nims Return A ppointment in: - Thursday for cast change with Dr. Dellia Nims Return A Other: - Halo=Supplies Bathing/ Shower/ Hygiene May shower and wash wound with soap and  water. Off-Loading Total Contact Cast to Right Lower Extremity Additional Orders / Instructions Follow Nutritious Diet - -High Protein Diet -Keep monitoring blood sugar Wound Treatment Wound #3 - Foot Wound Laterality: Plantar, Right Cleanser: Soap and Water 1 x Per Week/30 Days Discharge Instructions: May shower and wash wound with dial antibacterial soap and water prior to dressing change. Prim Dressing: Hydrofera Blue Ready Foam, 2.5 x2.5 in 1 x Per Week/30 Days ary Discharge Instructions: Apply to wound bed as instructed Secondary Dressing: Woven Gauze Sponge, Non-Sterile 4x4 in 1 x Per Week/30 Days Discharge Instructions: Apply over primary dressing as directed. Secondary Dressing: ABD Pad, 5x9 1 x Per Week/30 Days Discharge Instructions: Apply over primary dressing as directed. Secured With: 10M Medipore H Soft Cloth Surgical T ape, 4 x 10 (in/yd) 1 x Per Week/30 Days Discharge Instructions: Secure with tape as directed. Wound #4 - Foot Wound Laterality: Right, Lateral Cleanser: Soap and Water 1 x Per Week/30 Days Discharge Instructions: May shower and wash wound with dial antibacterial soap and water prior to dressing change. Prim Dressing: Hydrofera Blue Ready Foam, 2.5 x2.5 in 1 x Per Week/30 Days ary Discharge Instructions: Apply to wound bed as instructed Secondary Dressing: Woven Gauze Sponge, Non-Sterile 4x4 in 1 x Per Week/30 Days Discharge Instructions: Apply  over primary dressing as directed. Secondary Dressing: ABD Pad, 5x9 1 x Per Week/30 Days Discharge Instructions: Apply over primary dressing as directed. Secured With: 75M Medipore H Soft Cloth Surgical T ape, 4 x 10 (in/yd) 1 x Per Week/30 Days Discharge Instructions: Secure with tape as directed. Electronic Signature(s) Signed: 10/07/2021 5:06:47 PM By: Linton Ham MD Signed: 10/07/2021 5:43:30 PM By: Lorrin Jackson Entered By: Lorrin Jackson on 10/07/2021  09:00:22 -------------------------------------------------------------------------------- Problem List Details Patient Name: Date of Service: [name], [name] 10/07/2021 8:30 A M Medical Record Number: [id number] Patient Account Number: [id number] Date of Birth/Sex: Treating RN: [birth year] (65 y.o. Marcheta Grammes Primary Care Provider: Vikki Ports Other Clinician: Referring Provider: Treating Provider/Extender: Rudean Haskell, Newt Lukes in Treatment: 3 Active Problems ICD-10 Encounter Code Description Active Date MDM Diagnosis E11.621 Type 2 diabetes mellitus with foot ulcer 09/16/2021 No Yes L97.518 Non-pressure chronic ulcer of other part of right foot with other specified 09/16/2021 No Yes severity E11.51 Type 2 diabetes mellitus with diabetic peripheral angiopathy without gangrene 09/16/2021 No Yes Inactive Problems Resolved Problems Electronic Signature(s) Signed: 10/07/2021 5:06:47 PM By: Linton Ham MD Entered By: Linton Ham on 10/07/2021 09:03:12 -------------------------------------------------------------------------------- Progress Note Details Patient Name: Date of Service: [name] 10/07/2021 8:30 A M Medical Record Number: [id number] Patient Account Number: [id number] Date of Birth/Sex: Treating RN: 11/18/1955 (65 y.o. 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 He developed a callused area with an open area over the right fifth metatarsal head that was noted by his girlfriend in April. He was seen by Dr. Einar Gip surrounding this time for known PAD. He had arteriogram on 02/28/2019 that showed a right SFA occlusion that was open successfully post stenting. He is due to have  another angiogram next week to look at the left side. This is also by Dr. Einar Gip. The patient has applied various dressings to the right foot including alginate and more recently Silvadene cream. He has recently completed a course of Augmentin. He also has severe neuropathy. I have reviewed the x-rays done in Dr. Leigh Aurora office on [facility] health link. I agree there does not seem to be any plain x-ray suggestion of osteomyelitis in the right fifth met head About a month ago he also noted an open area on the left foot. This is not nearly as worrisome as the right side at this point. 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 History of peritoneal abscess, Most recent ABIs done by Dr. Einar Gip on 04/07/2019 showed monophasic waveforms on the left biphasic waveforms on the right. ABIs were 1.0 bilaterally 05/04/19; patient came in today with increasing pain in the right foot. Bone scraping admitted last week showed Enterococcus faecalis he was on Augmentin until sometime last week, amoxicillin is the treatment of choice for enterococcus nevertheless I'll restart this. His MRI is booked for 6/30. He saw Dr. Kris Hartmann. He underwent angiography. Please loosely placed stents in the common femoral and right proximal SFA were widely patent. The left side showed iliac artery and femoral arteries to have very mild disease. 2 vessel runoff in the  left leg his left AT is occluded. 7/2; patient's MRI as feared was not very good. Cellulitis and myositis. 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 Also suspected septic arthritis at the joint. Bone scraping I did when he first came in showed enterococcus faecalis he has been on on Augmentin but he finished this. 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 Sharol Given. He apparently tolerated this well and sees him in the afternoon. He also has the area on the lateral part of the left fifth met head. We have been using silver alginate to this. 7/31; we did not look at the right fifth ray amputation site is been seen by orthopedics this afternoon. He has a small area on the left lateral fifth met head. Undermining laterally. We have been using silver alginate. 8/14-Patient comes a 2 weeks with left lateral fifth toe met head wound which is looking better continue to use silver alginate the right fifth ray amputation is handled by Dr. Sharol Given and he placed him on doxycycline this week 8/28; wound on the lateral left fifth metatarsal head has closed over. He still has an open wound on the right foot which is a surgical wound being managed by Dr. Sharol Given READMISSION 09/16/2021 Mr. Davies is now a 65 year old man with type 2 diabetes. He was in the clinic in 2020 with an area on the left fifth metatarsal head and also a surgical wound on the right foot. My notes state that the latter was followed by Dr. Sharol Given and ultimately ended up with a right fifth toe amputation. More recently he has been followed by Dr. Earleen Newport at [facility] for an area on the right lateral foot. This goes back in the summertime. The patient tells me that he has had this open on and off for about a year. Will cover with callus and then reopen. X-ray apparently on 9/27 has been negative. He has been using a Pegasys shoe. He has undergone debridements by Dr. Jacqualyn Posey . Was started on collagen roughly 2 weeks ago. He had a course of doxycycline in October and was started yesterday on another course of doxycycline. The patient has a history of PAD followed by Dr. Einar Gip Past medical history includes congestive heart failure, coronary artery disease status post MI in December 2021, ischemic cardiomyopathy he has an LVAD. Type  2 diabetes with good control with an A1c of 6.5. According to the patient he has had stents placed by Dr. Einar Gip x3 in the right leg. Most recent ABIs on 08/14/2021 showed a ABI of 1 on the right and 0.84 on the left. As mentioned apparent stents in the right leg although I have not looked over these records 09/23/2021; patient with an area on his left lateral foot. He has had a previous right toe perhaps partial ray amputation. I used silver alginate last week he is using a healing sandal. Attempting to limit his activity. Culture I did last time of this showed Proteus. Not plated to the doxycycline he was on. It is possible the doxycycline would cover this but I am going to give him an additional 7 days of Cipro to replace the doxycycline. More reliable. I have reviewed his past vascular history. He follows with Dr. Einar Gip. He does have indeed a stent in the right SFA. Last arterial studies I can see where from 01/08/2021 at which time he had an ABI  in the right of 0.72 monophasic waveforms. He has had an angiogram as well which I pasted below. I think he is felt to have sufficient perfusion to heal this wound Peripheral arteriogram 02/28/2019: Pelvic aortogram with limited bifemoral arteriogram revealed patent distal abdominal aorta without aneurysm, patent, bilateral iliac and common femoral vessels. Right SFA is occluded in the ostium and reconstitutes outside of the Hunter's canal. Below the right knee there is two-vessel runoff, AT is occluded. Left leg was not studied beyond left common femoral artery. Successful PTA and stenting of right SFA, prolonged procedure, difficult procedure with use of multiple balloons, guidewires and catheters. 100% stenosis reduced to 0% with implantation of 3 overlapping 6.0 x 120 x2; 6.0 x 100 mm Eluvia DES. 180 mL contrast utilized. Peripheral arteriogram 05/02/2019: Right common femoral artery and right proximal SFA previously placed stent widely patent. Right  iliac artery shows mild disease. Left iliac artery and left femoral arteries show very mild disease. There is two-vessel runoff in the left leg, left AT is occluded. Intermediate stenosis noted in the left distal and proximal SFA, pressure pullback reveals no significant gradient. 11/29; patient comes in today with improvement in his original wound on the right lateral plantar foot. However he has a new wound just below it small and superficial. The patient states that the wound opened yesterday. We have been using silver alginate. He completed the ciprofloxacin I gave him last time Objective Constitutional Sitting or standing Blood Pressure is within target range for patient.. Pulse regular and within target range for patient.Marland Kitchen Respirations regular, non-labored and within target range.. Temperature is normal and within the target range for the patient.Marland Kitchen Appears in no distress. Vitals Time Taken: 8:38 AM, Height: 70 in, Weight: 274 lbs, BMI: 39.3, Temperature: 98.1 F, Pulse: 69 bpm, Respiratory Rate: 18 breaths/min, Blood Pressure: 126/76 mmHg, Capillary Blood Glucose: 111 mg/dl. General Notes: Wound exam; right lateral foot. The area which is a small linear valley looks smaller to me. Surface of this looks dry but otherwise healthy. There is no evidence of infection oo He has a new area proximally towards the heel also on the lateral foot this is superficial small. oo No evidence of infection in either area Integumentary (Hair, Skin) Wound #3 status is Open. Original cause of wound was Gradually Appeared. The date acquired was: 12/11/2019. The wound has been in treatment 3 weeks. The wound is located on the [place]. The wound measures 0.5cm length x 1.5cm width x 0.3cm depth; 0.589cm^2 area and 0.177cm^3 volume. There is Fat Layer (Subcutaneous Tissue) exposed. There is no tunneling or undermining noted. There is a medium amount of serosanguineous drainage noted. The wound margin is  distinct with the outline attached to the wound base. There is large (67-100%) red granulation within the wound bed. There is a small (1-33%) amount of necrotic tissue within the wound bed including Adherent Slough. General Notes: Calloused periwound Wound #4 status is Open. Original cause of wound was Gradually Appeared. The date acquired was: 10/06/2021. The wound is located on the Right,Lateral Foot. The wound measures 0.5cm length x 0.5cm width x 0.2cm depth; 0.196cm^2 area and 0.039cm^3 volume. There is Fat Layer (Subcutaneous Tissue) exposed. There is no tunneling or undermining noted. There is a medium amount of serosanguineous drainage noted. The wound margin is distinct with the outline attached to the wound base. There is large (67-100%) red granulation within the wound bed. There is no necrotic tissue within the wound bed. Assessment Active Problems ICD-10  Type 2 diabetes mellitus with foot ulcer Non-pressure chronic ulcer of other part of right foot with other specified severity Type 2 diabetes mellitus with diabetic peripheral angiopathy without gangrene Procedures Wound #3 Pre-procedure diagnosis of Wound #3 is a Diabetic Wound/Ulcer of the Lower Extremity located on the Right,Plantar Foot . There was a T Contact Cast otal Procedure by Ricard Dillon., MD. Post procedure Diagnosis Wound #3: Same as Pre-Procedure Wound #4 Pre-procedure diagnosis of Wound #4 is a Diabetic Wound/Ulcer of the Lower Extremity located on the Right,Lateral Foot . There was a T Contact Cast otal Procedure by Ricard Dillon., MD. Post procedure Diagnosis Wound #4: Same as Pre-Procedure Plan Follow-up Appointments: Return Appointment in 1 week. - -with Dr. Dellia Nims Return Appointment in: - Thursday for cast change with Dr. Dellia Nims Other: - Halo=Supplies Bathing/ Shower/ Hygiene: May shower and wash wound with soap and water. Off-Loading: T Contact Cast to Right Lower Extremity otal Additional  Orders / Instructions: Follow Nutritious Diet - -High Protein Diet -Keep monitoring blood sugar WOUND #3: - Foot Wound Laterality: Plantar, Right Cleanser: Soap and Water 1 x Per Week/30 Days Discharge Instructions: May shower and wash wound with dial antibacterial soap and water prior to dressing change. Prim Dressing: Hydrofera Blue Ready Foam, 2.5 x2.5 in 1 x Per Week/30 Days ary Discharge Instructions: Apply to wound bed as instructed Secondary Dressing: Woven Gauze Sponge, Non-Sterile 4x4 in 1 x Per Week/30 Days Discharge Instructions: Apply over primary dressing as directed. Secondary Dressing: ABD Pad, 5x9 1 x Per Week/30 Days Discharge Instructions: Apply over primary dressing as directed. Secured With: 461M Medipore H Soft Cloth Surgical T ape, 4 x 10 (in/yd) 1 x Per Week/30 Days Discharge Instructions: Secure with tape as directed. WOUND #4: - Foot Wound Laterality: Right, Lateral Cleanser: Soap and Water 1 x Per Week/30 Days Discharge Instructions: May shower and wash wound with dial antibacterial soap and water prior to dressing change. Prim Dressing: Hydrofera Blue Ready Foam, 2.5 x2.5 in 1 x Per Week/30 Days ary Discharge Instructions: Apply to wound bed as instructed Secondary Dressing: Woven Gauze Sponge, Non-Sterile 4x4 in 1 x Per Week/30 Days Discharge Instructions: Apply over primary dressing as directed. Secondary Dressing: ABD Pad, 5x9 1 x Per Week/30 Days Discharge Instructions: Apply over primary dressing as directed. Secured With: 461M Medipore H Soft Cloth Surgical T ape, 4 x 10 (in/yd) 1 x Per Week/30 Days Discharge Instructions: Secure with tape as directed. 1Hydrofera Blue to the wound and total contact cast 2. The patient will be back for the obligatory total contact cast change later in the week Electronic Signature(s) Signed: 10/09/2021 4:34:24 PM By: Linton Ham MD Entered By: Linton Ham on 10/09/2021  09:15:04 -------------------------------------------------------------------------------- Total Contact Cast Details Patient Name: Date of Service: [name] 10/07/2021 8:30 A M Medical Record Number: [id number] Patient Account Number: [id number] Date of Birth/Sex: Treating RN: May 29, 1956 (65 y.o. [name] Primary Care Provider: Vikki Ports Other Clinician: Referring Provider: Treating Provider/Extender: Rudean Haskell, Newt Lukes in Treatment: 3 T Contact Cast Applied for Wound Assessment: otal Wound #3 Right,Plantar Foot Performed By: Physician Ricard Dillon., MD Post Procedure Diagnosis Same as Pre-procedure Electronic Signature(s) Signed: 10/07/2021 5:06:47 PM By: Linton Ham MD Entered By: Linton Ham on 10/07/2021 09:03:29 -------------------------------------------------------------------------------- Total Contact Cast Details Patient Name: Date of Service: [name] 10/07/2021 8:30 A M Medical Record Number: [id number] Patient Account Number: [id number] Date of Birth/Sex: Treating RN: January 25, 1956 (  65 y.o. [name] Primary Care Provider: Vikki Ports Other Clinician: Referring Provider: Treating Provider/Extender: Rudean Haskell, Newt Lukes in Treatment: 3 T Contact Cast Applied for Wound Assessment: otal Wound #4 Right,Lateral Foot Performed By: Physician Ricard Dillon., MD Post Procedure Diagnosis Same as Pre-procedure Electronic Signature(s) Signed: 10/07/2021 5:06:47 PM By: Linton Ham MD Entered By: Linton Ham on 10/07/2021 09:03:37 -------------------------------------------------------------------------------- SuperBill Details Patient Name: Date of Service: [name], [name] 10/07/2021 Medical Record Number: [id number] Patient Account Number: [id number] Date of Birth/Sex: Treating RN: 15-Jun-1956 (65 y.o. 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

## 2021-10-07 NOTE — Progress Notes (Signed)
ZACCHAEUS, HALM ([id number]) Visit Report for 10/07/2021 Arrival Information Details Patient Name: Date of Service: [name] 10/07/2021 8:30 A M Medical Record Number: [id number] Patient Account Number: [id number] Date of Birth/Sex: Treating RN: Apr 13, 1956 (65 y.o. Marcheta Grammes Primary Care Priscillia Fouch: Rita Ohara A Other Clinician: Referring Skylar Priest: Treating Carlee Tesfaye/Extender: Rudean Haskell, Newt Lukes in Treatment: 3 Visit Information History Since Last Visit Added or deleted any medications: No Patient Arrived: Ambulatory Any new allergies or adverse reactions: No Arrival Time: 08:35 Had a fall or experienced change in No Transfer Assistance: None activities of daily living that may affect Patient Identification Verified: Yes risk of falls: Secondary Verification Process Completed: Yes Signs or symptoms of abuse/neglect since No Patient Requires Transmission-Based Precautions: No last visito Patient Has Alerts: Yes Hospitalized since last visit: No Patient Alerts: Patient on Blood Thinner Implantable device outside of the clinic No 08/14/21 ABI R=1.0 L=.84 excluding cellular tissue based products placed in the center since last visit: Has Dressing in Place as Prescribed: Yes Has Footwear/Offloading in Place as Yes Prescribed: Right: Surgical Shoe with Pressure Relief Insole Pain Present Now: No Electronic Signature(s) Signed: 10/07/2021 5:43:30 PM By: Lorrin Jackson Entered By: Lorrin Jackson on 10/07/2021 08:37:09 -------------------------------------------------------------------------------- Encounter Discharge Information Details Patient Name: Date of Service: [name], [name] 10/07/2021 8:30 A M Medical Record Number: [id number] Patient Account Number: [id number] Date of Birth/Sex: Treating RN: 01/02/1956 (65 y.o. Marcheta Grammes Primary Care Melisssa Donner: Vikki Ports Other Clinician: Referring Shyloh Krinke: Treating Tavien Chestnut/Extender: Darleene Cleaver in Treatment: 3 Encounter Discharge Information Items Discharge Condition: Stable Ambulatory Status: Ambulatory Discharge Destination: Home Transportation: Private Auto Schedule Follow-up Appointment: Yes Clinical Summary of Care: Provided on 10/07/2021 Form Type Recipient Paper Patient Patient Electronic Signature(s) Signed: 10/07/2021 5:43:30 PM By: Lorrin Jackson Entered By: Lorrin Jackson on 10/07/2021 09:01:50 -------------------------------------------------------------------------------- Lower Extremity Assessment Details Patient Name: Date of Service: [name] 10/07/2021 8:30 A M Medical Record Number: [id number] Patient Account Number: [id number] Date of Birth/Sex: Treating RN: 09/15/1956 (65 y.o. Marcheta Grammes Primary Care Ario Mcdiarmid: Vikki Ports Other Clinician: Referring Shiah Berhow: Treating Ena Demary/Extender: Rudean Haskell, Eve A Weeks in Treatment: 3 Edema Assessment Assessed: [Left: No] [Right: Yes] Edema: [Left: N] [Right: o] Calf Left: Right: Point of Measurement: 33 cm From Medial Instep 37.5 cm Ankle Left: Right: Point of Measurement: 9 cm From Medial Instep 23 cm Vascular Assessment Pulses: Dorsalis Pedis Palpable: [Right:Yes] Electronic Signature(s) Signed: 10/07/2021 5:43:30 PM By: Lorrin Jackson Entered By: Lorrin Jackson on 10/07/2021 08:48:54 -------------------------------------------------------------------------------- Multi Wound Chart Details Patient Name: Date of Service: [name] 10/07/2021 8:30 A M Medical Record Number: [id number] Patient Account Number: [id number] Date of Birth/Sex: Treating RN: [birth year] (65 y.o. [name] Primary Care Macen Joslin: Vikki Ports Other Clinician: Referring Elia Keenum: Treating Shylyn Younce/Extender: Rudean Haskell, Eve A Weeks in Treatment: 3 Vital Signs Height(in): 70 Capillary Blood Glucose(mg/dl): 111 Weight(lbs):  274 Pulse(bpm): 38 Body Mass Index(BMI): 78 Blood Pressure(mmHg): 126/76 Temperature(F): 98.1 Respiratory Rate(breaths/min): 18 Photos: [3:Right, Plantar Foot] [4:Right, Lateral Foot] [N/A:N/A N/A] Wound Location: [3:Gradually Appeared] [4:Gradually Appeared] [N/A:N/A] Wounding Event: [3:Diabetic Wound/Ulcer of the Lower] [4:Diabetic Wound/Ulcer of the Lower] [N/A:N/A] Primary Etiology: [3:Extremity Cataracts, Congestive Heart Failure, Cataracts, Congestive Heart Failure, N/A] [4:Extremity] Comorbid History: [3:Coronary Artery Disease, Hypertension, Myocardial Infarction, Hypertension, Myocardial Infarction, Peripheral Arterial Disease, Type II Peripheral Arterial Disease, Type II Diabetes, Osteomyelitis, Neuropathy Diabetes, Osteomyelitis,  Neuropathy 12/11/2019] [4:Coronary Artery Disease,  10/06/2021] [N/A:N/A] Date Acquired: [3:3] [4:0] [N/A:N/A] Weeks of Treatment: [3:Open] [4:Open] [N/A:N/A] Wound Status: [3:0.5x1.5x0.3] [4:0.5x0.5x0.2] [N/A:N/A] Measurements L x W x D (cm) [3:0.589] [4:0.196] [N/A:N/A] A (cm) : rea [3:0.177] [4:0.039] [N/A:N/A] Volume (cm) : [3:89.30%] [4:0.00%] [N/A:N/A] % Reduction in A rea: [3:83.90%] [4:0.00%] [N/A:N/A] % Reduction in Volume: [3:Grade 1] [4:Grade 1] [N/A:N/A] Classification: [3:Medium] [4:Medium] [N/A:N/A] Exudate A mount: [3:Serosanguineous] [4:Serosanguineous] [N/A:N/A] Exudate Type: [3:red, brown] [4:red, brown] [N/A:N/A] Exudate Color: [3:Distinct, outline attached] [4:Distinct, outline attached] [N/A:N/A] Wound Margin: [3:Large (67-100%)] [4:Large (67-100%)] [N/A:N/A] Granulation A mount: [3:Red] [4:Red] [N/A:N/A] Granulation Quality: [3:Small (1-33%)] [4:None Present (0%)] [N/A:N/A] Necrotic A mount: [3:Fat Layer (Subcutaneous Tissue): Yes Fat Layer (Subcutaneous Tissue): Yes N/A] Exposed Structures: [3:Fascia: No Tendon: No Muscle: No Joint: No Bone: No Medium (34-66%)] [4:Fascia: No Tendon: No Muscle: No Joint: No Bone: No None]  [N/A:N/A] Epithelialization: [3:Calloused periwound] [4:N/A] [N/A:N/A] Assessment Notes: [3:T Contact Cast otal] [4:T Contact Cast otal] [N/A:N/A] Treatment Notes Wound #3 (Foot) Wound Laterality: Plantar, Right Cleanser Soap and Water Discharge Instruction: May shower and wash wound with dial antibacterial soap and water prior to dressing change. Peri-Wound Care Topical Primary Dressing Hydrofera Blue Ready Foam, 2.5 x2.5 in Discharge Instruction: Apply to wound bed as instructed Secondary Dressing Woven Gauze Sponge, Non-Sterile 4x4 in Discharge Instruction: Apply over primary dressing as directed. ABD Pad, 5x9 Discharge Instruction: Apply over primary dressing as directed. Secured With 39M Medipore H Soft Cloth Surgical T ape, 4 x 10 (in/yd) Discharge Instruction: Secure with tape as directed. Compression Wrap Compression Stockings Add-Ons Wound #4 (Foot) Wound Laterality: Right, Lateral Cleanser Soap and Water Discharge Instruction: May shower and wash wound with dial antibacterial soap and water prior to dressing change. Peri-Wound Care Topical Primary Dressing Hydrofera Blue Ready Foam, 2.5 x2.5 in Discharge Instruction: Apply to wound bed as instructed Secondary Dressing Woven Gauze Sponge, Non-Sterile 4x4 in Discharge Instruction: Apply over primary dressing as directed. ABD Pad, 5x9 Discharge Instruction: Apply over primary dressing as directed. Secured With 39M Medipore H Soft Cloth Surgical T ape, 4 x 10 (in/yd) Discharge Instruction: Secure with tape as directed. Compression Wrap Compression Stockings Add-Ons Electronic Signature(s) Signed: 10/07/2021 5:06:47 PM By: Linton Ham MD Signed: 10/07/2021 5:44:57 PM By: Deon Pilling RN, BSN Entered By: Linton Ham on 10/07/2021 09:03:20 -------------------------------------------------------------------------------- Multi-Disciplinary Care Plan Details Patient Name: Date of Service: [name], [name]  10/07/2021 8:30 A M Medical Record Number: [id number] Patient Account Number: [id number] Date of Birth/Sex: Treating RN: 11/05/1956 (65 y.o. Marcheta Grammes Primary Care Yuka Lallier: Vikki Ports Other Clinician: Referring Danira Nylander: Treating Wanya Bangura/Extender: Rudean Haskell, Newt Lukes in Treatment: 3 Active Inactive Nutrition Nursing Diagnoses: Impaired glucose control: actual or potential Goals: Patient/caregiver verbalizes understanding of need to maintain therapeutic glucose control per primary care physician Date Initiated: 09/16/2021 Target Resolution Date: 10/14/2021 Goal Status: Active Interventions: Assess patient nutrition upon admission and as needed per policy Provide education on elevated blood sugars and impact on wound healing Provide education on nutrition Treatment Activities: Obtain HgA1c : 09/16/2021 Notes: Wound/Skin Impairment Nursing Diagnoses: Impaired tissue integrity Goals: Patient/caregiver will verbalize understanding of skin care regimen Date Initiated: 09/16/2021 Target Resolution Date: 10/14/2021 Goal Status: Active Ulcer/skin breakdown will have a volume reduction of 30% by week 4 Date Initiated: 09/16/2021 Target Resolution Date: 10/14/2021 Goal Status: Active Interventions: Assess patient/caregiver ability to perform ulcer/skin care regimen upon admission and as needed Assess ulceration(s) every visit Provide education on ulcer and skin care Treatment Activities: Topical wound management initiated :  09/16/2021 Notes: Electronic Signature(s) Signed: 10/07/2021 5:43:30 PM By: Lorrin Jackson Entered By: Lorrin Jackson on 10/07/2021 08:49:15 -------------------------------------------------------------------------------- Pain Assessment Details Patient Name: Date of Service: [name] 10/07/2021 8:30 A M Medical Record Number: [id number] Patient Account Number: [id number] Date of Birth/Sex: Treating RN: 20-Oct-1956 (65 y.o. Marcheta Grammes Primary Care Caira Poche: Vikki Ports Other Clinician: Referring Davieon Stockham: Treating Weston Kallman/Extender: Rudean Haskell, Eve A Weeks in Treatment: 3 Active Problems Location of Pain Severity and Description of Pain Patient Has Paino No Site Locations Pain Management and Medication Current Pain Management: Electronic Signature(s) Signed: 10/07/2021 5:43:30 PM By: Lorrin Jackson Entered By: Lorrin Jackson on 10/07/2021 08:40:26 -------------------------------------------------------------------------------- Patient/Caregiver Education Details Patient Name: Date of Service: [name] 11/29/2022andnbsp8:30 [place] Record Number: [id number] Patient Account Number: [id number] Date of Birth/Gender: Treating RN: 1956/02/05 (65 y.o. Marcheta Grammes Primary Care Physician: Vikki Ports Other Clinician: Referring Physician: Treating Physician/Extender: Darleene Cleaver in Treatment: 3 Education Assessment Education Provided To: Patient Education Topics Provided Elevated Blood Sugar/ Impact on Healing: Methods: Explain/Verbal, Printed Responses: State content correctly Wound/Skin Impairment: Methods: Explain/Verbal, Printed Responses: State content correctly Electronic Signature(s) Signed: 10/07/2021 5:43:30 PM By: Lorrin Jackson Entered By: Lorrin Jackson on 10/07/2021 08:49:39 -------------------------------------------------------------------------------- Wound Assessment Details Patient Name: Date of Service: [name] 10/07/2021 8:30 A M Medical Record Number: [id number] Patient Account Number: [id number] Date of Birth/Sex: Treating RN: Oct 08, 1956 (65 y.o. Marcheta Grammes Primary Care Treshun Wold: Rita Ohara A Other Clinician: Referring Tranika Scholler: Treating Hades Mathew/Extender: Rudean Haskell, Eve A Weeks in Treatment: 3 Wound Status Wound Number: 3 Primary Diabetic Wound/Ulcer of the Lower  Extremity Etiology: Wound Location: Right, Plantar Foot Wound Open Wounding Event: Gradually Appeared Status: Date Acquired: 12/11/2019 Comorbid Cataracts, Congestive Heart Failure, Coronary Artery Disease, Weeks Of Treatment: 3 History: Hypertension, Myocardial Infarction, Peripheral Arterial Disease, Clustered Wound: No Type II Diabetes, Osteomyelitis, Neuropathy Photos Wound Measurements Length: (cm) 0.5 Width: (cm) 1.5 Depth: (cm) 0.3 Area: (cm) 0.589 Volume: (cm) 0.177 % Reduction in Area: 89.3% % Reduction in Volume: 83.9% Epithelialization: Medium (34-66%) Tunneling: No Undermining: No Wound Description Classification: Grade 1 Wound Margin: Distinct, outline attached Exudate Amount: Medium Exudate Type: Serosanguineous Exudate Color: red, brown Foul Odor After Cleansing: No Slough/Fibrino Yes Wound Bed Granulation Amount: Large (67-100%) Exposed Structure Granulation Quality: Red Fascia Exposed: No Necrotic Amount: Small (1-33%) Fat Layer (Subcutaneous Tissue) Exposed: Yes Necrotic Quality: Adherent Slough Tendon Exposed: No Muscle Exposed: No Joint Exposed: No Bone Exposed: No Assessment Notes Calloused periwound Treatment Notes Wound #3 (Foot) Wound Laterality: Plantar, Right Cleanser Soap and Water Discharge Instruction: May shower and wash wound with dial antibacterial soap and water prior to dressing change. Peri-Wound Care Topical Primary Dressing Hydrofera Blue Ready Foam, 2.5 x2.5 in Discharge Instruction: Apply to wound bed as instructed Secondary Dressing Woven Gauze Sponge, Non-Sterile 4x4 in Discharge Instruction: Apply over primary dressing as directed. ABD Pad, 5x9 Discharge Instruction: Apply over primary dressing as directed. Secured With 55M Medipore H Soft Cloth Surgical T ape, 4 x 10 (in/yd) Discharge Instruction: Secure with tape as directed. Compression Wrap Compression Stockings Add-Ons Electronic Signature(s) Signed:  10/07/2021 5:43:30 PM By: Lorrin Jackson Entered By: Lorrin Jackson on 10/07/2021 08:46:27 -------------------------------------------------------------------------------- Wound Assessment Details Patient Name: Date of Service: [name] 10/07/2021 8:30 A M Medical Record Number: [id number] Patient Account Number: [id number] Date of Birth/Sex: Treating RN: 19-Nov-1955 (65 y.o. Marcheta Grammes Primary Care Kyrin Garn:  Rita Ohara A Other Clinician: Referring Dao Memmott: Treating Yamilette Garretson/Extender: Rudean Haskell, Eve A Weeks in Treatment: 3 Wound Status Wound Number: 4 Primary Diabetic Wound/Ulcer of the Lower Extremity Etiology: Wound Location: Right, Lateral Foot Wound Open Wounding Event: Gradually Appeared Status: Date Acquired: 10/06/2021 Comorbid Cataracts, Congestive Heart Failure, Coronary Artery Disease, Weeks Of Treatment: 0 History: Hypertension, Myocardial Infarction, Peripheral Arterial Disease, Clustered Wound: No Type II Diabetes, Osteomyelitis, Neuropathy Photos Wound Measurements Length: (cm) 0.5 Width: (cm) 0.5 Depth: (cm) 0.2 Area: (cm) 0.196 Volume: (cm) 0.039 % Reduction in Area: 0% % Reduction in Volume: 0% Epithelialization: None Tunneling: No Undermining: No Wound Description Classification: Grade 1 Wound Margin: Distinct, outline attached Exudate Amount: Medium Exudate Type: Serosanguineous Exudate Color: red, brown Foul Odor After Cleansing: No Slough/Fibrino No Wound Bed Granulation Amount: Large (67-100%) Exposed Structure Granulation Quality: Red Fascia Exposed: No Necrotic Amount: None Present (0%) Fat Layer (Subcutaneous Tissue) Exposed: Yes Tendon Exposed: No Muscle Exposed: No Joint Exposed: No Bone Exposed: No Treatment Notes Wound #4 (Foot) Wound Laterality: Right, Lateral Cleanser Soap and Water Discharge Instruction: May shower and wash wound with dial antibacterial soap and water prior to dressing  change. Peri-Wound Care Topical Primary Dressing Hydrofera Blue Ready Foam, 2.5 x2.5 in Discharge Instruction: Apply to wound bed as instructed Secondary Dressing Woven Gauze Sponge, Non-Sterile 4x4 in Discharge Instruction: Apply over primary dressing as directed. ABD Pad, 5x9 Discharge Instruction: Apply over primary dressing as directed. Secured With 15M Medipore H Soft Cloth Surgical T ape, 4 x 10 (in/yd) Discharge Instruction: Secure with tape as directed. Compression Wrap Compression Stockings Add-Ons Electronic Signature(s) Signed: 10/07/2021 5:43:30 PM By: Lorrin Jackson Entered By: Lorrin Jackson on 10/07/2021 08:47:05 -------------------------------------------------------------------------------- Vitals Details Patient Name: Date of Service: [name], [name] 10/07/2021 8:30 A M Medical Record Number: [id number] Patient Account Number: [id number] Date of Birth/Sex: Treating RN: [birth year] (65 y.o. Marcheta Grammes Primary Care Khristen Cheyney: Rita Ohara A Other Clinician: Referring Hurshel Bouillon: Treating Tanairy Payeur/Extender: Rudean Haskell, Eve A Weeks in Treatment: 3 Vital Signs Time Taken: 08:38 Temperature (F): 98.1 Height (in): 70 Pulse (bpm): 69 Weight (lbs): 274 Respiratory Rate (breaths/min): 18 Body Mass Index (BMI): 39.3 Blood Pressure (mmHg): 126/76 Capillary Blood Glucose (mg/dl): 111 Reference Range: 80 - 120 mg / dl Electronic Signature(s) Signed: 10/07/2021 5:43:30 PM By: Lorrin Jackson Entered By: Lorrin Jackson on 10/07/2021 08:38:46

## 2021-10-08 ENCOUNTER — Encounter (HOSPITAL_BASED_OUTPATIENT_CLINIC_OR_DEPARTMENT_OTHER): Payer: Medicare Other | Admitting: Internal Medicine

## 2021-10-09 ENCOUNTER — Encounter (HOSPITAL_BASED_OUTPATIENT_CLINIC_OR_DEPARTMENT_OTHER): Payer: Medicare Other | Attending: Internal Medicine | Admitting: Internal Medicine

## 2021-10-09 ENCOUNTER — Other Ambulatory Visit: Payer: Self-pay

## 2021-10-09 DIAGNOSIS — L97518 Non-pressure chronic ulcer of other part of right foot with other specified severity: Secondary | ICD-10-CM | POA: Insufficient documentation

## 2021-10-09 DIAGNOSIS — E1151 Type 2 diabetes mellitus with diabetic peripheral angiopathy without gangrene: Secondary | ICD-10-CM | POA: Insufficient documentation

## 2021-10-09 DIAGNOSIS — E11621 Type 2 diabetes mellitus with foot ulcer: Secondary | ICD-10-CM | POA: Diagnosis present

## 2021-10-09 DIAGNOSIS — I110 Hypertensive heart disease with heart failure: Secondary | ICD-10-CM | POA: Insufficient documentation

## 2021-10-09 DIAGNOSIS — E1140 Type 2 diabetes mellitus with diabetic neuropathy, unspecified: Secondary | ICD-10-CM | POA: Insufficient documentation

## 2021-10-09 DIAGNOSIS — I509 Heart failure, unspecified: Secondary | ICD-10-CM | POA: Insufficient documentation

## 2021-10-09 DIAGNOSIS — E11319 Type 2 diabetes mellitus with unspecified diabetic retinopathy without macular edema: Secondary | ICD-10-CM | POA: Insufficient documentation

## 2021-10-09 NOTE — Progress Notes (Signed)
SHAMAN, MUSCARELLA ([id number]) Visit Report for 10/09/2021 HPI Details Patient Name: Date of Service: Eduardo [name] MES M. 10/09/2021 9:00 [place] Record Number: [id number] Patient Account Number: [id number] Date of Birth/Sex: Treating RN: [birth year] (65 y.o. 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 He developed a callused area with an open area over the right fifth metatarsal head that was noted by his girlfriend in April. He was seen by Dr. Einar Gip surrounding this time for known PAD. He had arteriogram on 02/28/2019 that showed a right SFA occlusion that was open successfully post stenting. He is due to have another angiogram next week to look at the left side. This is also by Dr. Einar Gip. The patient has applied various dressings to the right foot including alginate and more recently Silvadene cream. He has recently completed a course of Augmentin. He also has severe neuropathy. I have reviewed the x-rays done in Dr. Leigh Aurora office on [facility] health link. I agree there does not seem to be any plain x-ray suggestion of osteomyelitis in the right fifth met head About a month ago he also noted an open area on the left foot. This is not nearly as worrisome as the right side at this point. 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 History of peritoneal abscess, Most recent ABIs done by Dr. Einar Gip on 04/07/2019 showed monophasic waveforms on the left biphasic waveforms on the right. ABIs were 1.0 bilaterally 05/04/19; patient came in today with increasing  pain in the right foot. Bone scraping admitted last week showed Enterococcus faecalis he was on Augmentin until sometime last week, amoxicillin is the treatment of choice for enterococcus nevertheless I'll restart this. His MRI is booked for 6/30. He saw Dr. Kris Hartmann. He underwent angiography. Please loosely placed stents in the common femoral and right proximal SFA were widely patent. The left side showed iliac artery and femoral arteries to have very mild disease. 2 vessel runoff in the left leg his left AT is occluded. 7/2; patient's MRI as feared was not very good. Cellulitis and myositis. 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 Also suspected septic arthritis at the joint. Bone scraping I did when he first came in showed enterococcus faecalis he has been on on Augmentin but he finished this. 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 Sharol Given. He apparently tolerated this well and sees him in the afternoon. He also has the area on the lateral part of the left fifth met head. We have been using silver alginate to this. 7/31; we did not look at the right fifth ray amputation site is been seen by orthopedics this afternoon. He has a small area on the left lateral fifth met head. Undermining laterally. We have been using silver alginate. 8/14-Patient comes a 2 weeks with left lateral fifth toe met head wound which is looking better continue to use silver alginate the right fifth ray amputation is handled by Dr. Sharol Given and he placed him on doxycycline this week 8/28; wound on the lateral left fifth metatarsal  head has closed over. He still has an open wound on the right foot which is a surgical wound being managed by Dr. Sharol Given READMISSION 09/16/2021 [name] is now a 65 year old man with type 2 diabetes. He was in the clinic in 2020 with an area on the left fifth metatarsal head and also a surgical wound  on the right foot. My notes state that the latter was followed by Dr. Sharol Given and ultimately ended up with a right fifth toe amputation. More recently he has been followed by Dr. Earleen Newport at [facility] for an area on the right lateral foot. This goes back in the summertime. The patient tells me that he has had this open on and off for about a year. Will cover with callus and then reopen. X-ray apparently on 9/27 has been negative. He has been using a Pegasys shoe. He has undergone debridements by Dr. Jacqualyn Posey . Was started on collagen roughly 2 weeks ago. He had a course of doxycycline in October and was started yesterday on another course of doxycycline. The patient has a history of PAD followed by Dr. Einar Gip Past medical history includes congestive heart failure, coronary artery disease status post MI in December 2021, ischemic cardiomyopathy he has an LVAD. Type 2 diabetes with good control with an A1c of 6.5. According to the patient he has had stents placed by Dr. Einar Gip x3 in the right leg. Most recent ABIs on 08/14/2021 showed a ABI of 1 on the right and 0.84 on the left. As mentioned apparent stents in the right leg although I have not looked over these records 09/23/2021; patient with an area on his left lateral foot. He has had a previous right toe perhaps partial ray amputation. I used silver alginate last week he is using a healing sandal. Attempting to limit his activity. Culture I did last time of this showed Proteus. Not plated to the doxycycline he was on. It is possible the doxycycline would cover this but I am going to give him an additional 7 days of Cipro to replace the doxycycline. More reliable. I have reviewed his past vascular history. He follows with Dr. Einar Gip. He does have indeed a stent in the right SFA. Last arterial studies I can see where from 01/08/2021 at which time he had an ABI in the right of 0.72 monophasic waveforms. He has had an angiogram as well which I pasted  below. I think he is felt to have sufficient perfusion to heal this wound Peripheral arteriogram 02/28/2019: Pelvic aortogram with limited bifemoral arteriogram revealed patent distal abdominal aorta without aneurysm, patent, bilateral iliac and common femoral vessels. Right SFA is occluded in the ostium and reconstitutes outside of the Hunter's canal. Below the right knee there is two-vessel runoff, AT is occluded. Left leg was not studied beyond left common femoral artery. Successful PTA and stenting of right SFA, prolonged procedure, difficult procedure with use of multiple balloons, guidewires and catheters. 100% stenosis reduced to 0% with implantation of 3 overlapping 6.0 x 120 x2; 6.0 x 100 mm Eluvia DES. 180 mL contrast utilized. Peripheral arteriogram 05/02/2019: Right common femoral artery and right proximal SFA previously placed stent widely patent. Right iliac artery shows mild disease. Left iliac artery and left femoral arteries show very mild disease. There is two-vessel runoff in the left leg, left AT is occluded. Intermediate stenosis noted in the left distal and proximal SFA, pressure pullback reveals no significant gradient. 11/29; patient comes in today with improvement in  his original wound on the right lateral plantar foot. However he has a new wound just below it small and superficial. The patient states that the wound opened yesterday. We have been using silver alginate. He completed the ciprofloxacin I gave him last time 12/1 patient in for the obligatory first total cast change. He has 2 wounds on the right plantar foot. Per our intake nurse everything looks better Electronic Signature(s) Signed: 10/09/2021 4:34:24 PM By: Linton Ham MD Entered By: Linton Ham on 10/09/2021 09:29:45 -------------------------------------------------------------------------------- Physical Exam Details Patient Name: Date of Service: Eduardo [name] MES M. 10/09/2021 9:00 A M Medical  Record Number: [id number] Patient Account Number: [id number] Date of Birth/Sex: Treating RN: [birth year] (65 y.o. M) Primary Care Provider: Vikki Ports Other Clinician: Referring Provider: Treating Provider/Extender: Rudean Haskell, Eve A Weeks in Treatment: 3 Constitutional Sitting or standing Blood Pressure is within target range for patient.. Pulse regular and within target range for patient.Marland Kitchen Respirations regular, non-labored and within target range.. Temperature is normal and within the target range for the patient.Marland Kitchen Appears in no distress. Notes Wound exam; the wound was not examined today. Obligatory first total contact cast change Electronic Signature(s) Signed: 10/09/2021 4:34:24 PM By: Linton Ham MD Entered By: Linton Ham on 10/09/2021 09:30:48 -------------------------------------------------------------------------------- Physician Orders Details Patient Name: Date of Service: [name], [name] 10/09/2021 9:00 A M Medical Record Number: [id number] Patient Account Number: [id number] Date of Birth/Sex: Treating RN: June 13, 1956 (65 y.o. [name] Primary Care Provider: Vikki Ports Other Clinician: Referring Provider: Treating Provider/Extender: Rudean Haskell, Newt Lukes in Treatment: 3 Verbal / Phone Orders: No Diagnosis Coding ICD-10 Coding Code Description E11.621 Type 2 diabetes mellitus with foot ulcer L97.518 Non-pressure chronic ulcer of other part of right foot with other specified severity E11.51 Type 2 diabetes mellitus with diabetic peripheral angiopathy without gangrene Follow-up Appointments ppointment in 1 week. - -with Dr. Dellia Nims Tuesday Return A Other: - Halo=Supplies Bathing/ Shower/ Hygiene May shower and wash wound with soap and water. Off-Loading Total Contact Cast to Right Lower Extremity Additional Orders / Instructions Follow Nutritious Diet - -High Protein Diet -Keep monitoring blood sugar Wound Treatment Wound  #3 - Foot Wound Laterality: Plantar, Right Cleanser: Soap and Water 1 x Per Week/30 Days Discharge Instructions: May shower and wash wound with dial antibacterial soap and water prior to dressing change. Prim Dressing: Hydrofera Blue Ready Foam, 2.5 x2.5 in 1 x Per Week/30 Days ary Discharge Instructions: Apply to wound bed as instructed Secondary Dressing: Woven Gauze Sponge, Non-Sterile 4x4 in 1 x Per Week/30 Days Discharge Instructions: Apply over primary dressing as directed. Secondary Dressing: ABD Pad, 5x9 1 x Per Week/30 Days Discharge Instructions: Apply over primary dressing as directed. Secured With: 79M Medipore H Soft Cloth Surgical T ape, 4 x 10 (in/yd) 1 x Per Week/30 Days Discharge Instructions: Secure with tape as directed. Wound #4 - Foot Wound Laterality: Right, Lateral Cleanser: Soap and Water 1 x Per Week/30 Days Discharge Instructions: May shower and wash wound with dial antibacterial soap and water prior to dressing change. Prim Dressing: Hydrofera Blue Ready Foam, 2.5 x2.5 in 1 x Per Week/30 Days ary Discharge Instructions: Apply to wound bed as instructed Secondary Dressing: Woven Gauze Sponge, Non-Sterile 4x4 in 1 x Per Week/30 Days Discharge Instructions: Apply over primary dressing as directed. Secondary Dressing: ABD Pad, 5x9 1 x Per Week/30 Days Discharge Instructions: Apply over primary dressing as directed. Secured With: 79M Medipore H Public affairs consultant Surgical  T ape, 4 x 10 (in/yd) 1 x Per Week/30 Days Discharge Instructions: Secure with tape as directed. Electronic Signature(s) Signed: 10/09/2021 4:34:24 PM By: Linton Ham MD Signed: 10/09/2021 5:22:04 PM By: Deon Pilling RN, BSN Entered By: Deon Pilling on 10/09/2021 09:26:58 -------------------------------------------------------------------------------- Problem List Details Patient Name: Date of Service: [name], [name] 10/09/2021 9:00 [place] Record Number: [id number] Patient Account Number:  [id number] Date of Birth/Sex: Treating RN: [birth year] (65 y.o. [name] Primary Care Provider: Vikki Ports Other Clinician: Referring Provider: Treating Provider/Extender: Rudean Haskell, Newt Lukes in Treatment: 3 Active Problems ICD-10 Encounter Code Description Active Date MDM Diagnosis E11.621 Type 2 diabetes mellitus with foot ulcer 09/16/2021 No Yes L97.518 Non-pressure chronic ulcer of other part of right foot with other specified 09/16/2021 No Yes severity E11.51 Type 2 diabetes mellitus with diabetic peripheral angiopathy without gangrene 09/16/2021 No Yes Inactive Problems Resolved Problems Electronic Signature(s) Signed: 10/09/2021 4:34:24 PM By: Linton Ham MD Entered By: Linton Ham on 10/09/2021 09:28:12 -------------------------------------------------------------------------------- Progress Note Details Patient Name: Date of Service: Eduardo [name] MES M. 10/09/2021 9:00 A M Medical Record Number: [id number] Patient Account Number: [id number] Date of Birth/Sex: Treating RN: [birth year] (65 y.o. 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 He developed a callused area with an open area over the right fifth metatarsal head that was noted by his girlfriend in April. He was seen by Dr. Einar Gip surrounding this time for known PAD. He had arteriogram on 02/28/2019 that showed a right SFA occlusion that was open successfully post stenting. He is due to have another angiogram next week to look at the left side. This is also by Dr. Einar Gip. The patient has applied various dressings to the right foot including alginate and more recently Silvadene cream. He has recently completed a course of Augmentin. He also has  severe neuropathy. I have reviewed the x-rays done in Dr. Leigh Aurora office on [facility] health link. I agree there does not seem to be any plain x-ray suggestion of osteomyelitis in the right fifth met head About a month ago he also noted an open area on the left foot. This is not nearly as worrisome as the right side at this point. 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 History of peritoneal abscess, Most recent ABIs done by Dr. Einar Gip on 04/07/2019 showed monophasic waveforms on the left biphasic waveforms on the right. ABIs were 1.0 bilaterally 05/04/19; patient came in today with increasing pain in the right foot. Bone scraping admitted last week showed Enterococcus faecalis he was on Augmentin until sometime last week, amoxicillin is the treatment of choice for enterococcus nevertheless I'll restart this. His MRI is booked for 6/30. He saw Dr. Kris Hartmann. He underwent angiography. Please loosely placed stents in the common femoral and right proximal SFA were widely patent. The left side showed iliac artery and femoral arteries to have very mild disease. 2 vessel runoff in the left leg his left AT is occluded. 7/2; patient's MRI as feared was not very good. Cellulitis and myositis. 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 Also  suspected septic arthritis at the joint. Bone scraping I did when he first came in showed enterococcus faecalis he has been on on Augmentin but he finished this. 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 Sharol Given. He apparently tolerated this well and sees him in the afternoon. He also has the area on the lateral part of the left fifth met head. We have been using silver alginate to  this. 7/31; we did not look at the right fifth ray amputation site is been seen by orthopedics this afternoon. He has a small area on the left lateral fifth met head. Undermining laterally. We have been using silver alginate. 8/14-Patient comes a 2 weeks with left lateral fifth toe met head wound which is looking better continue to use silver alginate the right fifth ray amputation is handled by Dr. Sharol Given and he placed him on doxycycline this week 8/28; wound on the lateral left fifth metatarsal head has closed over. He still has an open wound on the right foot which is a surgical wound being managed by Dr. Sharol Given READMISSION 09/16/2021 Mr. Holzer is now a 65 year old man with type 2 diabetes. He was in the clinic in 2020 with an area on the left fifth metatarsal head and also a surgical wound on the right foot. My notes state that the latter was followed by Dr. Sharol Given and ultimately ended up with a right fifth toe amputation. More recently he has been followed by Dr. Earleen Newport at [facility] for an area on the right lateral foot. This goes back in the summertime. The patient tells me that he has had this open on and off for about a year. Will cover with callus and then reopen. X-ray apparently on 9/27 has been negative. He has been using a Pegasys shoe. He has undergone debridements by Dr. Jacqualyn Posey . Was started on collagen roughly 2 weeks ago. He had a course of doxycycline in October and was started yesterday on another course of doxycycline. The patient has a history of PAD followed by Dr. Einar Gip Past medical history includes congestive heart failure, coronary artery disease status post MI in December 2021, ischemic cardiomyopathy he has an LVAD. Type 2 diabetes with good control with an A1c of 6.5. According to the patient he has had stents placed by Dr. Einar Gip x3 in the right leg. Most recent ABIs on 08/14/2021 showed a ABI of 1 on the right and 0.84 on the left. As mentioned apparent stents in the  right leg although I have not looked over these records 09/23/2021; patient with an area on his left lateral foot. He has had a previous right toe perhaps partial ray amputation. I used silver alginate last week he is using a healing sandal. Attempting to limit his activity. Culture I did last time of this showed Proteus. Not plated to the doxycycline he was on. It is possible the doxycycline would cover this but I am going to give him an additional 7 days of Cipro to replace the doxycycline. More reliable. I have reviewed his past vascular history. He follows with Dr. Einar Gip. He does have indeed a stent in the right SFA. Last arterial studies I can see where from 01/08/2021 at which time he had an ABI in the right of 0.72 monophasic waveforms. He has had an angiogram as well which I pasted below. I think he is felt to have sufficient perfusion to heal this wound  Peripheral arteriogram 02/28/2019: Pelvic aortogram with limited bifemoral arteriogram revealed patent distal abdominal aorta without aneurysm, patent, bilateral iliac and common femoral vessels. Right SFA is occluded in the ostium and reconstitutes outside of the Hunter's canal. Below the right knee there is two-vessel runoff, AT is occluded. Left leg was not studied beyond left common femoral artery. Successful PTA and stenting of right SFA, prolonged procedure, difficult procedure with use of multiple balloons, guidewires and catheters. 100% stenosis reduced to 0% with implantation of 3 overlapping 6.0 x 120 x2; 6.0 x 100 mm Eluvia DES. 180 mL contrast utilized. Peripheral arteriogram 05/02/2019: Right common femoral artery and right proximal SFA previously placed stent widely patent. Right iliac artery shows mild disease. Left iliac artery and left femoral arteries show very mild disease. There is two-vessel runoff in the left leg, left AT is occluded. Intermediate stenosis noted in the left distal and proximal SFA, pressure pullback  reveals no significant gradient. 11/29; patient comes in today with improvement in his original wound on the right lateral plantar foot. However he has a new wound just below it small and superficial. The patient states that the wound opened yesterday. We have been using silver alginate. He completed the ciprofloxacin I gave him last time 12/1 patient in for the obligatory first total cast change. He has 2 wounds on the right plantar foot. Per our intake nurse everything looks better Objective Constitutional Sitting or standing Blood Pressure is within target range for patient.. Pulse regular and within target range for patient.Marland Kitchen Respirations regular, non-labored and within target range.. Temperature is normal and within the target range for the patient.Marland Kitchen Appears in no distress. Vitals Time Taken: 9:05 AM, Height: 70 in, Weight: 274 lbs, BMI: 39.3, Temperature: 97.4 F, Pulse: 74 bpm, Respiratory Rate: 17 breaths/min, Blood Pressure: 108/74 mmHg, Capillary Blood Glucose: 76 mg/dl. General Notes: Wound exam; the wound was not examined today. Obligatory first total contact cast change Integumentary (Hair, Skin) Wound #3 status is Open. Original cause of wound was Gradually Appeared. The date acquired was: 12/11/2019. The wound has been in treatment 3 weeks. The wound is located on the [place]. The wound measures 0.5cm length x 1.5cm width x 0.3cm depth; 0.589cm^2 area and 0.177cm^3 volume. There is a medium amount of serosanguineous drainage noted. Wound #4 status is Open. Original cause of wound was Gradually Appeared. The date acquired was: 10/06/2021. The wound is located on the Right,Lateral Foot. The wound measures 0.5cm length x 0.5cm width x 0.2cm depth; 0.196cm^2 area and 0.039cm^3 volume. There is a medium amount of serosanguineous drainage noted. Assessment Active Problems ICD-10 Type 2 diabetes mellitus with foot ulcer Non-pressure chronic ulcer of other part of right foot  with other specified severity Type 2 diabetes mellitus with diabetic peripheral angiopathy without gangrene Procedures Wound #3 Pre-procedure diagnosis of Wound #3 is a Diabetic Wound/Ulcer of the Lower Extremity located on the Right,Plantar Foot . There was a T Contact Cast otal Procedure by Ricard Dillon., MD. Post procedure Diagnosis Wound #3: Same as Pre-Procedure Wound #4 Pre-procedure diagnosis of Wound #4 is a Diabetic Wound/Ulcer of the Lower Extremity located on the Right,Lateral Foot . There was a T Contact Cast otal Procedure by Ricard Dillon., MD. Post procedure Diagnosis Wound #4: Same as Pre-Procedure Plan Follow-up Appointments: Return Appointment in 1 week. - -with Dr. Dellia Nims Tuesday Other: - Halo=Supplies Bathing/ Shower/ Hygiene: May shower and wash wound with soap and water. Off-Loading: T Contact Cast to Right Lower Extremity otal  Additional Orders / Instructions: Follow Nutritious Diet - -High Protein Diet -Keep monitoring blood sugar WOUND #3: - Foot Wound Laterality: Plantar, Right Cleanser: Soap and Water 1 x Per Week/30 Days Discharge Instructions: May shower and wash wound with dial antibacterial soap and water prior to dressing change. Prim Dressing: Hydrofera Blue Ready Foam, 2.5 x2.5 in 1 x Per Week/30 Days ary Discharge Instructions: Apply to wound bed as instructed Secondary Dressing: Woven Gauze Sponge, Non-Sterile 4x4 in 1 x Per Week/30 Days Discharge Instructions: Apply over primary dressing as directed. Secondary Dressing: ABD Pad, 5x9 1 x Per Week/30 Days Discharge Instructions: Apply over primary dressing as directed. Secured With: 75M Medipore H Soft Cloth Surgical T ape, 4 x 10 (in/yd) 1 x Per Week/30 Days Discharge Instructions: Secure with tape as directed. WOUND #4: - Foot Wound Laterality: Right, Lateral Cleanser: Soap and Water 1 x Per Week/30 Days Discharge Instructions: May shower and wash wound with dial antibacterial soap  and water prior to dressing change. Prim Dressing: Hydrofera Blue Ready Foam, 2.5 x2.5 in 1 x Per Week/30 Days ary Discharge Instructions: Apply to wound bed as instructed Secondary Dressing: Woven Gauze Sponge, Non-Sterile 4x4 in 1 x Per Week/30 Days Discharge Instructions: Apply over primary dressing as directed. Secondary Dressing: ABD Pad, 5x9 1 x Per Week/30 Days Discharge Instructions: Apply over primary dressing as directed. Secured With: 75M Medipore H Soft Cloth Surgical T ape, 4 x 10 (in/yd) 1 x Per Week/30 Days Discharge Instructions: Secure with tape as directed. 1. T contact cast change we will see the wound again in a week otal Electronic Signature(s) Signed: 10/09/2021 4:34:24 PM By: Linton Ham MD Entered By: Linton Ham on 10/09/2021 09:31:23 -------------------------------------------------------------------------------- Total Contact Cast Details Patient Name: Date of Service: Eduardo [name] MES M. 10/09/2021 9:00 [place] Record Number: [id number] Patient Account Number: [id number] Date of Birth/Sex: Treating RN: [birth year] (65 y.o. [name] Primary Care Provider: Vikki Ports Other Clinician: Referring Provider: Treating Provider/Extender: Rudean Haskell, Newt Lukes in Treatment: 3 T Contact Cast Applied for Wound Assessment: otal Wound #3 Right,Plantar Foot Performed By: Physician Ricard Dillon., MD Post Procedure Diagnosis Same as Pre-procedure Electronic Signature(s) Signed: 10/09/2021 4:34:24 PM By: Linton Ham MD Signed: 10/09/2021 5:22:04 PM By: Deon Pilling RN, BSN Entered By: Deon Pilling on 10/09/2021 09:26:25 -------------------------------------------------------------------------------- Total Contact Cast Details Patient Name: Date of Service: Eduardo [name] MES M. 10/09/2021 9:00 [place] Record Number: [id number] Patient Account Number: [id number] Date of Birth/Sex: Treating RN: 12/20/1955 (65 y.o. [name] Primary Care Provider: Vikki Ports Other Clinician: Referring Provider: Treating Provider/Extender: Rudean Haskell, Newt Lukes in Treatment: 3 T Contact Cast Applied for Wound Assessment: otal Wound #4 Right,Lateral Foot Performed By: Physician Ricard Dillon., MD Post Procedure Diagnosis Same as Pre-procedure Electronic Signature(s) Signed: 10/09/2021 4:34:24 PM By: Linton Ham MD Signed: 10/09/2021 5:22:04 PM By: Deon Pilling RN, BSN Entered By: Deon Pilling on 10/09/2021 09:26:25 -------------------------------------------------------------------------------- [place] Details Patient Name: Date of Service: [name], [name] 10/09/2021 Medical Record Number: [id number] Patient Account Number: [id number] Date of Birth/Sex: Treating RN: Jun 06, 1956 (65 y.o. 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

## 2021-10-09 NOTE — Progress Notes (Signed)
PANFILO, KETCHUM ([id number]) Visit Report for 10/09/2021 Arrival Information Details Patient Name: Date of Service: Eduardo [name] MES M. 10/09/2021 9:00 [place] Record Number: [id number] Patient Account Number: [id number] Date of Birth/Sex: Treating RN: Apr 02, 1956 (65 y.o. Eduardo [name], Eduardo [name] Primary Care Khaliah Barnick: Vikki Ports Other Clinician: Referring Jaksen Fiorella: Treating Roshawna Colclasure/Extender: Rudean Haskell, Newt Lukes in Treatment: 3 Visit Information History Since Last Visit Added or deleted any medications: No Patient Arrived: Ambulatory Any new allergies or adverse reactions: No Arrival Time: 09:04 Had a fall or experienced change in No Accompanied By: self activities of daily living that may affect Transfer Assistance: None risk of falls: Patient Identification Verified: Yes Signs or symptoms of abuse/neglect since last visito No Secondary Verification Process Completed: Yes Hospitalized since last visit: No Patient Requires Transmission-Based No Implantable device outside of the clinic excluding No Precautions: cellular tissue based products placed in the center Patient Has Alerts: Yes since last visit: Patient Alerts: Patient on Blood Thinner Has Dressing in Place as Prescribed: Yes 08/14/21 ABI R=1.0 L=.84 Has Footwear/Offloading in Place as Prescribed: Yes Right: T Contact Cast otal Pain Present Now: No Electronic Signature(s) Signed: 10/09/2021 5:20:51 PM By: [name] Entered By: Rhae Hammock on 10/09/2021 09:05:07 -------------------------------------------------------------------------------- Encounter Discharge Information Details Patient Name: Date of Service: Eduardo [name], Eduardo [name] MES M. 10/09/2021 9:00 A M Medical Record Number: [id number] Patient Account Number: [id number] Date of Birth/Sex: Treating RN: 1955/12/31 (65 y.o. Eduardo [name], Eduardo [name] Primary Care Chantia Amalfitano: Vikki Ports Other Clinician: Referring Grayling Schranz: Treating Crissie Aloi/Extender:  Darleene Cleaver in Treatment: 3 Encounter Discharge Information Items Discharge Condition: Stable Ambulatory Status: Ambulatory Discharge Destination: Home Transportation: Private Auto Accompanied By: self Schedule Follow-up Appointment: Yes Clinical Summary of Care: Patient Declined Electronic Signature(s) Signed: 10/09/2021 5:20:51 PM By: [name] Entered By: Rhae Hammock on 10/09/2021 09:29:01 -------------------------------------------------------------------------------- Lower Extremity Assessment Details Patient Name: Date of Service: Eduardo [name] MES M. 10/09/2021 9:00 A M Medical Record Number: [id number] Patient Account Number: [id number] Date of Birth/Sex: Treating RN: [birth year] (66 y.o. Eduardo [name], [place] Primary Care Lamesha Tibbits: Vikki Ports Other Clinician: Referring Elena Davia: Treating Tityana Pagan/Extender: Rudean Haskell, Eve A Weeks in Treatment: 3 Edema Assessment Assessed: [Left: No] [Right: Yes] Edema: [Left: N] [Right: o] Calf Left: Right: Point of Measurement: 33 cm From Medial Instep 37.5 cm Ankle Left: Right: Point of Measurement: 9 cm From Medial Instep 23 cm Vascular Assessment Pulses: Dorsalis Pedis Palpable: [Right:Yes] Posterior Tibial Palpable: [Right:Yes] Electronic Signature(s) Signed: 10/09/2021 5:20:51 PM By: [name] Entered By: Rhae Hammock on 10/09/2021 09:11:24 -------------------------------------------------------------------------------- [place] Details Patient Name: Date of Service: Eduardo [name] MES M. 10/09/2021 9:00 [place] Record Number: [id number] Patient Account Number: [id number] Date of Birth/Sex: Treating RN: August 19, 1956 (65 y.o. Eduardo [name] Primary Care Chinmay Squier: Vikki Ports Other Clinician: Referring Loran Fleet: Treating Shelsy Seng/Extender: Rudean Haskell, Newt Lukes in Treatment: 3 Active Inactive Nutrition Nursing  Diagnoses: Impaired glucose control: actual or potential Goals: Patient/caregiver verbalizes understanding of need to maintain therapeutic glucose control per primary care physician Date Initiated: 09/16/2021 Target Resolution Date: 10/14/2021 Goal Status: Active Interventions: Assess patient nutrition upon admission and as needed per policy Provide education on elevated blood sugars and impact on wound healing Provide education on nutrition Treatment Activities: Obtain HgA1c : 09/16/2021 Notes: Wound/Skin Impairment Nursing Diagnoses: Impaired tissue integrity Goals: Patient/caregiver will verbalize understanding of skin care regimen Date Initiated: 09/16/2021 Target Resolution Date: 10/14/2021 Goal Status:  Active Ulcer/skin breakdown will have a volume reduction of 30% by week 4 Date Initiated: 09/16/2021 Target Resolution Date: 10/14/2021 Goal Status: Active Interventions: Assess patient/caregiver ability to perform ulcer/skin care regimen upon admission and as needed Assess ulceration(s) every visit Provide education on ulcer and skin care Treatment Activities: Topical wound management initiated : 09/16/2021 Notes: Electronic Signature(s) Signed: 10/09/2021 5:22:04 PM By: Deon Pilling RN, BSN Entered By: Deon Pilling on 10/09/2021 09:27:08 -------------------------------------------------------------------------------- Pain Assessment Details Patient Name: Date of Service: Eduardo [name] MES M. 10/09/2021 9:00 [place] Record Number: [id number] Patient Account Number: [id number] Date of Birth/Sex: Treating RN: October 06, 1956 (65 y.o. Eduardo [name], Eduardo [name] Primary Care Demetress Tift: Vikki Ports Other Clinician: Referring Bryden Darden: Treating Jamauri Kruzel/Extender: Rudean Haskell, Eve A Weeks in Treatment: 3 Active Problems Location of Pain Severity and Description of Pain Patient Has Paino No Site Locations Pain Management and Medication Current Pain Management: Electronic  Signature(s) Signed: 10/09/2021 5:20:51 PM By: [name] Entered By: Rhae Hammock on 10/09/2021 09:05:48 -------------------------------------------------------------------------------- Patient/Caregiver Education Details Patient Name: Date of Service: Eduardo [name] MES M. 12/1/2022andnbsp9:00 A M Medical Record Number: [id number] Patient Account Number: [id number] Date of Birth/Gender: Treating RN: January 08, 1956 (65 y.o. Eduardo [name] Primary Care Physician: Vikki Ports Other Clinician: Referring Physician: Treating Physician/Extender: Darleene Cleaver in Treatment: 3 Education Assessment Education Provided To: Patient Education Topics Provided Elevated Blood Sugar/ Impact on Healing: Handouts: Elevated Blood Sugars: How Do They Affect Wound Healing Methods: Explain/Verbal Responses: Reinforcements needed Electronic Signature(s) Signed: 10/09/2021 5:22:04 PM By: Deon Pilling RN, BSN Entered By: Deon Pilling on 10/09/2021 09:27:31 -------------------------------------------------------------------------------- Wound Assessment Details Patient Name: Date of Service: Eduardo [name] MES M. 10/09/2021 9:00 [place] Record Number: [id number] Patient Account Number: [id number] Date of Birth/Sex: Treating RN: Jun 04, 1956 (65 y.o. Eduardo [name] Primary Care Tommye Lehenbauer: Vikki Ports Other Clinician: Referring Sanvika Cuttino: Treating Rainey Kahrs/Extender: Rudean Haskell, Eve A Weeks in Treatment: 3 Wound Status Wound Number: 3 Primary Etiology: Diabetic Wound/Ulcer of the Lower Extremity Wound Location: Right, Plantar Foot Wound Status: Open Wounding Event: Gradually Appeared Date Acquired: 12/11/2019 Weeks Of Treatment: 3 Clustered Wound: No Wound Measurements Length: (cm) 0.5 Width: (cm) 1.5 Depth: (cm) 0.3 Area: (cm) 0.589 Volume: (cm) 0.177 % Reduction in Area: 89.3% % Reduction in Volume: 83.9% Wound Description Classification: Grade  1 Exudate Amount: Medium Exudate Type: Serosanguineous Exudate Color: red, brown Treatment Notes Wound #3 (Foot) Wound Laterality: Plantar, Right Cleanser Soap and Water Discharge Instruction: May shower and wash wound with dial antibacterial soap and water prior to dressing change. Peri-Wound Care Topical Primary Dressing Hydrofera Blue Ready Foam, 2.5 x2.5 in Discharge Instruction: Apply to wound bed as instructed Secondary Dressing Woven Gauze Sponge, Non-Sterile 4x4 in Discharge Instruction: Apply over primary dressing as directed. ABD Pad, 5x9 Discharge Instruction: Apply over primary dressing as directed. Secured With 52M Medipore H Soft Cloth Surgical T ape, 4 x 10 (in/yd) Discharge Instruction: Secure with tape as directed. Compression Wrap Compression Stockings Add-Ons Electronic Signature(s) Signed: 10/09/2021 5:22:04 PM By: Deon Pilling RN, BSN Entered By: Deon Pilling on 10/09/2021 09:25:40 -------------------------------------------------------------------------------- Wound Assessment Details Patient Name: Date of Service: Eduardo [name], Eduardo [name] MES M. 10/09/2021 9:00 A M Medical Record Number: [id number] Patient Account Number: [id number] Date of Birth/Sex: Treating RN: 1956/09/11 (65 y.o. Eduardo [name] Primary Care Suhail Peloquin: Vikki Ports Other Clinician: Referring Takeysha Bonk: Treating Deshaun Schou/Extender: Rudean Haskell, Eve A Weeks in Treatment: 3 Wound Status Wound Number: 4 Primary  Etiology: Diabetic Wound/Ulcer of the Lower Extremity Wound Location: Right, Lateral Foot Wound Status: Open Wounding Event: Gradually Appeared Date Acquired: 10/06/2021 Weeks Of Treatment: 0 Clustered Wound: No Wound Measurements Length: (cm) 0.5 Width: (cm) 0.5 Depth: (cm) 0.2 Area: (cm) 0.196 Volume: (cm) 0.039 % Reduction in Area: 0% % Reduction in Volume: 0% Wound Description Classification: Grade 1 Exudate Amount: Medium Exudate Type: Serosanguineous Exudate  Color: red, brown Treatment Notes Wound #4 (Foot) Wound Laterality: Right, Lateral Cleanser Soap and Water Discharge Instruction: May shower and wash wound with dial antibacterial soap and water prior to dressing change. Peri-Wound Care Topical Primary Dressing Hydrofera Blue Ready Foam, 2.5 x2.5 in Discharge Instruction: Apply to wound bed as instructed Secondary Dressing Woven Gauze Sponge, Non-Sterile 4x4 in Discharge Instruction: Apply over primary dressing as directed. ABD Pad, 5x9 Discharge Instruction: Apply over primary dressing as directed. Secured With 68M Medipore H Soft Cloth Surgical T ape, 4 x 10 (in/yd) Discharge Instruction: Secure with tape as directed. Compression Wrap Compression Stockings Add-Ons Electronic Signature(s) Signed: 10/09/2021 5:22:04 PM By: Deon Pilling RN, BSN Entered By: Deon Pilling on 10/09/2021 09:25:40 -------------------------------------------------------------------------------- Vitals Details Patient Name: Date of Service: Eduardo [name], Eduardo [name] MES M. 10/09/2021 9:00 [place] Record Number: [id number] Patient Account Number: [id number] Date of Birth/Sex: Treating RN: 1956-08-14 (65 y.o. Eduardo [name], Eduardo [name] Primary Care Jeshurun [place]: Vikki Ports Other Clinician: Referring Belita Warsame: Treating Lizza Huffaker/Extender: Rudean Haskell, Eve A Weeks in Treatment: 3 Vital Signs Time Taken: 09:05 Temperature (F): 97.4 Height (in): 70 Pulse (bpm): 74 Weight (lbs): 274 Respiratory Rate (breaths/min): 17 Body Mass Index (BMI): 39.3 Blood Pressure (mmHg): 108/74 Capillary Blood Glucose (mg/dl): 76 Reference Range: 80 - 120 mg / dl Electronic Signature(s) Signed: 10/09/2021 5:20:51 PM By: [name] Entered By: Rhae Hammock on 10/09/2021 09:05:34

## 2021-10-10 ENCOUNTER — Encounter: Payer: Self-pay | Admitting: Cardiology

## 2021-10-10 NOTE — Telephone Encounter (Signed)
From pt

## 2021-10-10 NOTE — Telephone Encounter (Signed)
From patient.

## 2021-10-13 NOTE — Telephone Encounter (Signed)
From Dr. Einar Gip

## 2021-10-14 ENCOUNTER — Other Ambulatory Visit: Payer: Self-pay

## 2021-10-14 ENCOUNTER — Encounter (HOSPITAL_BASED_OUTPATIENT_CLINIC_OR_DEPARTMENT_OTHER): Payer: Medicare Other | Admitting: Internal Medicine

## 2021-10-14 DIAGNOSIS — E11621 Type 2 diabetes mellitus with foot ulcer: Secondary | ICD-10-CM | POA: Diagnosis not present

## 2021-10-15 ENCOUNTER — Other Ambulatory Visit: Payer: Self-pay | Admitting: Cardiology

## 2021-10-15 NOTE — Progress Notes (Signed)
Eduardo [name] ([id number]) Visit Report for 10/14/2021 HPI Details Patient Name: Date of Service: Eduardo [name] MES M. 10/14/2021 8:45 A M Medical Record Number: [id number] Patient Account Number: [id number] Date of Birth/Sex: Treating RN: 27-Jun-1956 (65 y.o. 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 He developed a callused area with an open area over the right fifth metatarsal head that was noted by his girlfriend in April. He was seen by Dr. Einar Gip surrounding this time for known PAD. He had arteriogram on 02/28/2019 that showed a right SFA occlusion that was open successfully post stenting. He is due to have another angiogram next week to look at the left side. This is also by Dr. Einar Gip. The patient has applied various dressings to the right foot including alginate and more recently Silvadene cream. He has recently completed a course of Augmentin. He also has severe neuropathy. I have reviewed the x-rays done in Dr. Leigh Aurora office on [facility] health link. I agree there does not seem to be any plain x-ray suggestion of osteomyelitis in the right fifth met head About a month ago he also noted an open area on the left foot. This is not nearly as worrisome as the right side at this point. 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 History of peritoneal abscess, Most recent ABIs done by Dr. Einar Gip on 04/07/2019 showed monophasic waveforms on the left biphasic waveforms on the right. ABIs were 1.0 bilaterally 05/04/19; patient came in today with increasing  pain in the right foot. Bone scraping admitted last week showed Enterococcus faecalis he was on Augmentin until sometime last week, amoxicillin is the treatment of choice for enterococcus nevertheless I'll restart this. His MRI is booked for 6/30. He saw Dr. Kris Hartmann. He underwent angiography. Please loosely placed stents in the common femoral and right proximal SFA were widely patent. The left side showed iliac artery and femoral arteries to have very mild disease. 2 vessel runoff in the left leg his left AT is occluded. 7/2; patient's MRI as feared was not very good. Cellulitis and myositis. 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 Also suspected septic arthritis at the joint. Bone scraping I did when he first came in showed enterococcus faecalis he has been on on Augmentin but he finished this. 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 Sharol Given. He apparently tolerated this well and sees him in the afternoon. He also has the area on the lateral part of the left fifth met head. We have been using silver alginate to this. 7/31; we did not look at the right fifth ray amputation site is been seen by orthopedics this afternoon. He has a small area on the left lateral fifth met head. Undermining laterally. We have been using silver alginate. 8/14-Patient comes a 2 weeks with left lateral fifth toe met head wound which is looking better continue to use silver alginate the right fifth ray amputation is handled by Dr. Sharol Given and he placed him on doxycycline this week 8/28; wound on the lateral left fifth metatarsal  head has closed over. He still has an open wound on the right foot which is a surgical wound being managed by Dr. Sharol Given READMISSION 09/16/2021 [name] is now a 65 year old man with type 2 diabetes. He was in the clinic in 2020 with an area on the left fifth metatarsal head and also a surgical wound  on the right foot. My notes state that the latter was followed by Dr. Sharol Given and ultimately ended up with a right fifth toe amputation. More recently he has been followed by Dr. Earleen Newport at [facility] for an area on the right lateral foot. This goes back in the summertime. The patient tells me that he has had this open on and off for about a year. Will cover with callus and then reopen. X-ray apparently on 9/27 has been negative. He has been using a Pegasys shoe. He has undergone debridements by Dr. Jacqualyn Posey . Was started on collagen roughly 2 weeks ago. He had a course of doxycycline in October and was started yesterday on another course of doxycycline. The patient has a history of PAD followed by Dr. Einar Gip Past medical history includes congestive heart failure, coronary artery disease status post MI in December 2021, ischemic cardiomyopathy he has an LVAD. Type 2 diabetes with good control with an A1c of 6.5. According to the patient he has had stents placed by Dr. Einar Gip x3 in the right leg. Most recent ABIs on 08/14/2021 showed a ABI of 1 on the right and 0.84 on the left. As mentioned apparent stents in the right leg although I have not looked over these records 09/23/2021; patient with an area on his left lateral foot. He has had a previous right toe perhaps partial ray amputation. I used silver alginate last week he is using a healing sandal. Attempting to limit his activity. Culture I did last time of this showed Proteus. Not plated to the doxycycline he was on. It is possible the doxycycline would cover this but I am going to give him an additional 7 days of Cipro to replace the doxycycline. More reliable. I have reviewed his past vascular history. He follows with Dr. Einar Gip. He does have indeed a stent in the right SFA. Last arterial studies I can see where from 01/08/2021 at which time he had an ABI in the right of 0.72 monophasic waveforms. He has had an angiogram as well which I pasted  below. I think he is felt to have sufficient perfusion to heal this wound Peripheral arteriogram 02/28/2019: Pelvic aortogram with limited bifemoral arteriogram revealed patent distal abdominal aorta without aneurysm, patent, bilateral iliac and common femoral vessels. Right SFA is occluded in the ostium and reconstitutes outside of the Hunter's canal. Below the right knee there is two-vessel runoff, AT is occluded. Left leg was not studied beyond left common femoral artery. Successful PTA and stenting of right SFA, prolonged procedure, difficult procedure with use of multiple balloons, guidewires and catheters. 100% stenosis reduced to 0% with implantation of 3 overlapping 6.0 x 120 x2; 6.0 x 100 mm Eluvia DES. 180 mL contrast utilized. Peripheral arteriogram 05/02/2019: Right common femoral artery and right proximal SFA previously placed stent widely patent. Right iliac artery shows mild disease. Left iliac artery and left femoral arteries show very mild disease. There is two-vessel runoff in the left leg, left AT is occluded. Intermediate stenosis noted in the left distal and proximal SFA, pressure pullback reveals no significant gradient. 11/29; patient comes in today with improvement in  his original wound on the right lateral plantar foot. However he has a new wound just below it small and superficial. The patient states that the wound opened yesterday. We have been using silver alginate. He completed the ciprofloxacin I gave him last time 12/1 patient in for the obligatory first total cast change. He has 2 wounds on the right plantar foot. Per our intake nurse everything looks better 12/6; patient comes in today he is done nicely. Both of his wounds almost look 100% epithelialized the area on the right lateral foot may be vulnerable in the center part. The rest of the area on the plantar foot looks fully epithelialized. I went over this with him he had custom inserts in his shoe which I  looked at these were sustained and tented in the area where this wound is. I have no doubt he walks on the lateral part of his foot accounting for these wounds and the wounds he has had on the right fifth toe which ultimately ended up in a right fifth toe amputation 2 years ago [the last time he was in this clinic Electronic Signature(s) Signed: 10/14/2021 4:21:48 PM By: Linton Ham MD Entered By: Linton Ham on 10/14/2021 09:45:15 -------------------------------------------------------------------------------- Physical Exam Details Patient Name: Date of Service: Eduardo [name] MES M. 10/14/2021 8:45 A M Medical Record Number: [id number] Patient Account Number: [id number] Date of Birth/Sex: Treating RN: [birth year] (65 y.o. M) Primary Care Provider: Vikki Ports Other Clinician: Referring Provider: Treating Provider/Extender: Rudean Haskell, Eve A Weeks in Treatment: 4 Constitutional Sitting or standing Blood Pressure is within target range for patient.. Pulse regular and within target range for patient.Marland Kitchen Respirations regular, non-labored and within target range.. Temperature is normal and within the target range for the patient.Marland Kitchen Appears in no distress. Notes Wound exam; everything looks fully epithelialized here on the plantar wound which I think was the original. On the right lateral foot under illumination a very small vulnerable area in the middle part of his foot Electronic Signature(s) Signed: 10/14/2021 4:21:48 PM By: Linton Ham MD Entered By: Linton Ham on 10/14/2021 09:46:04 -------------------------------------------------------------------------------- Physician Orders Details Patient Name: Date of Service: Eduardo [name] MES M. 10/14/2021 8:45 A M Medical Record Number: [id number] Patient Account Number: [id number] Date of Birth/Sex: Treating RN: 1956-07-24 (66 y.o. Erie Noe Primary Care Provider: Vikki Ports Other Clinician: Referring  Provider: Treating Provider/Extender: Rudean Haskell, Artist Pais Weeks in Treatment: 4 Verbal / Phone Orders: No Diagnosis Coding Follow-up Appointments ppointment in 1 week. - -with Dr. Dellia Nims Tuesday Return A Other: - Halo=Supplies Bathing/ Shower/ Hygiene May shower and wash wound with soap and water. Off-Loading Total Contact Cast to Right Lower Extremity Additional Orders / Instructions Follow Nutritious Diet - -High Protein Diet -Keep monitoring blood sugar Wound Treatment Wound #3 - Foot Wound Laterality: Plantar, Right Cleanser: Soap and Water 1 x Per Week/30 Days Discharge Instructions: May shower and wash wound with dial antibacterial soap and water prior to dressing change. Prim Dressing: Maxorb Extra Calcium Alginate 2x2 in 1 x Per Week/30 Days ary Discharge Instructions: Apply calcium alginate to wound bed as instructed Secondary Dressing: Woven Gauze Sponge, Non-Sterile 4x4 in 1 x Per Week/30 Days Discharge Instructions: Apply over primary dressing as directed. Secondary Dressing: ABD Pad, 5x9 1 x Per Week/30 Days Discharge Instructions: Apply over primary dressing as directed. Secured With: 55M Medipore H Soft Cloth Surgical T ape, 4 x 10 (in/yd) 1 x Per Week/30 Days Discharge Instructions: Secure with  tape as directed. Wound #4 - Foot Wound Laterality: Right, Lateral Cleanser: Soap and Water 1 x Per Week/30 Days Discharge Instructions: May shower and wash wound with dial antibacterial soap and water prior to dressing change. Prim Dressing: Maxorb Extra Calcium Alginate 2x2 in 1 x Per Week/30 Days ary Discharge Instructions: Apply calcium alginate to wound bed as instructed Secondary Dressing: Woven Gauze Sponge, Non-Sterile 4x4 in 1 x Per Week/30 Days Discharge Instructions: Apply over primary dressing as directed. Secondary Dressing: ABD Pad, 5x9 1 x Per Week/30 Days Discharge Instructions: Apply over primary dressing as directed. Secured With: 66M Medipore H  Soft Cloth Surgical T ape, 4 x 10 (in/yd) 1 x Per Week/30 Days Discharge Instructions: Secure with tape as directed. Electronic Signature(s) Signed: 10/14/2021 4:21:48 PM By: Linton Ham MD Signed: 10/15/2021 4:34:42 PM By: [name] Entered By: Rhae Hammock on 10/14/2021 09:37:03 -------------------------------------------------------------------------------- Problem List Details Patient Name: Date of Service: [name], [name] 10/14/2021 8:45 A M Medical Record Number: [id number] Patient Account Number: [id number] Date of Birth/Sex: Treating RN: [birth year] (64 y.o. M) Primary Care Provider: Vikki Ports Other Clinician: Referring Provider: Treating Provider/Extender: Rudean Haskell, Newt Lukes in Treatment: 4 Active Problems ICD-10 Encounter Code Description Active Date MDM Diagnosis E11.621 Type 2 diabetes mellitus with foot ulcer 09/16/2021 No Yes L97.518 Non-pressure chronic ulcer of other part of right foot with other specified 09/16/2021 No Yes severity E11.51 Type 2 diabetes mellitus with diabetic peripheral angiopathy without gangrene 09/16/2021 No Yes Inactive Problems Resolved Problems Electronic Signature(s) Signed: 10/14/2021 4:21:48 PM By: Linton Ham MD Entered By: Linton Ham on 10/14/2021 09:43:23 -------------------------------------------------------------------------------- Progress Note Details Patient Name: Date of Service: Eduardo [name] MES M. 10/14/2021 8:45 A M Medical Record Number: [id number] Patient Account Number: [id number] Date of Birth/Sex: Treating RN: [birth year] (65 y.o. 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 He developed a callused area with an open area over  the right fifth metatarsal head that was noted by his girlfriend in April. He was seen by Dr. Einar Gip surrounding this time for known PAD. He had arteriogram on 02/28/2019 that showed a right SFA occlusion that was open successfully post stenting. He is due to have another angiogram next week to look at the left side. This is also by Dr. Einar Gip. The patient has applied various dressings to the right foot including alginate and more recently Silvadene cream. He has recently completed a course of Augmentin. He also has severe neuropathy. I have reviewed the x-rays done in Dr. Leigh Aurora office on [facility] health link. I agree there does not seem to be any plain x-ray suggestion of osteomyelitis in the right fifth met head About a month ago he also noted an open area on the left foot. This is not nearly as worrisome as the right side at this point. 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 History of peritoneal abscess, Most recent ABIs done by Dr. Einar Gip on 04/07/2019 showed monophasic waveforms on the left biphasic waveforms on the right. ABIs were 1.0 bilaterally 05/04/19; patient came in today with increasing pain in the right foot. Bone  scraping admitted last week showed Enterococcus faecalis he was on Augmentin until sometime last week, amoxicillin is the treatment of choice for enterococcus nevertheless I'll restart this. His MRI is booked for 6/30. He saw Dr. Kris Hartmann. He underwent angiography. Please loosely placed stents in the common femoral and right proximal SFA were widely patent. The left side showed iliac artery and femoral arteries to have very mild disease. 2 vessel runoff in the left leg his left AT is occluded. 7/2; patient's MRI as feared was not very good. Cellulitis and myositis. Definite  osteomyelitis of the fifth metatarsal head and fifth proximal phalanx. Also suspected septic arthritis at the joint. Bone scraping I did when he first came in showed enterococcus faecalis he has been on on Augmentin but he finished this. 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 Sharol Given. He apparently tolerated this well and sees him in the afternoon. He also has the area on the lateral part of the left fifth met head. We have been using silver alginate to this. 7/31; we did not look at the right fifth ray amputation site is been seen by orthopedics this afternoon. He has a small area on the left lateral fifth met head. Undermining laterally. We have been using silver alginate. 8/14-Patient comes a 2 weeks with left lateral fifth toe met head wound which is looking better continue to use silver alginate the right fifth ray amputation is handled by Dr. Sharol Given and he placed him on doxycycline this week 8/28; wound on the lateral left fifth metatarsal head has closed over. He still has an open wound on the right foot which is a surgical wound being managed by Dr. Sharol Given READMISSION 09/16/2021 Mr. Crist is now a 65 year old man with type 2 diabetes. He was in the clinic in 2020 with an area on the left fifth metatarsal head and also a surgical wound on the right foot. My notes state that the latter was followed by Dr. Sharol Given and ultimately ended up with a right fifth toe amputation. More recently he has been followed by Dr. Earleen Newport at [facility] for an area on the right lateral foot. This goes back in the summertime. The patient tells me that he has had this open on and off for about a year. Will cover with callus and then reopen. X-ray apparently on 9/27 has been negative. He has been using a Pegasys shoe. He has undergone debridements by Dr. Jacqualyn Posey . Was started on collagen roughly 2 weeks ago. He had a course of doxycycline in October  and was started yesterday on another course of doxycycline. The patient has a history of PAD followed by Dr. Einar Gip Past medical history includes congestive heart failure, coronary artery disease status post MI in December 2021, ischemic cardiomyopathy he has an LVAD. Type 2 diabetes with good control with an A1c of 6.5. According to the patient he has had stents placed by Dr. Einar Gip x3 in the right leg. Most recent ABIs on 08/14/2021 showed a ABI of 1 on the right and 0.84 on the left. As mentioned apparent stents in the right leg although I have not looked over these records 09/23/2021; patient with an area on his left lateral foot. He has had a previous right toe perhaps partial ray amputation. I used silver alginate last week he is using a healing sandal. Attempting to limit his activity. Culture I did last time of this showed  Proteus. Not plated to the doxycycline he was on. It is possible the doxycycline would cover this but I am going to give him an additional 7 days of Cipro to replace the doxycycline. More reliable. I have reviewed his past vascular history. He follows with Dr. Einar Gip. He does have indeed a stent in the right SFA. Last arterial studies I can see where from 01/08/2021 at which time he had an ABI in the right of 0.72 monophasic waveforms. He has had an angiogram as well which I pasted below. I think he is felt to have sufficient perfusion to heal this wound Peripheral arteriogram 02/28/2019: Pelvic aortogram with limited bifemoral arteriogram revealed patent distal abdominal aorta without aneurysm, patent, bilateral iliac and common femoral vessels. Right SFA is occluded in the ostium and reconstitutes outside of the Hunter's canal. Below the right knee there is two-vessel runoff, AT is occluded. Left leg was not studied beyond left common femoral artery. Successful PTA and stenting of right SFA, prolonged procedure, difficult procedure with use of multiple balloons, guidewires and  catheters. 100% stenosis reduced to 0% with implantation of 3 overlapping 6.0 x 120 x2; 6.0 x 100 mm Eluvia DES. 180 mL contrast utilized. Peripheral arteriogram 05/02/2019: Right common femoral artery and right proximal SFA previously placed stent widely patent. Right iliac artery shows mild disease. Left iliac artery and left femoral arteries show very mild disease. There is two-vessel runoff in the left leg, left AT is occluded. Intermediate stenosis noted in the left distal and proximal SFA, pressure pullback reveals no significant gradient. 11/29; patient comes in today with improvement in his original wound on the right lateral plantar foot. However he has a new wound just below it small and superficial. The patient states that the wound opened yesterday. We have been using silver alginate. He completed the ciprofloxacin I gave him last time 12/1 patient in for the obligatory first total cast change. He has 2 wounds on the right plantar foot. Per our intake nurse everything looks better 12/6; patient comes in today he is done nicely. Both of his wounds almost look 100% epithelialized the area on the right lateral foot may be vulnerable in the center part. The rest of the area on the plantar foot looks fully epithelialized. I went over this with him he had custom inserts in his shoe which I looked at these were sustained and tented in the area where this wound is. I have no doubt he walks on the lateral part of his foot accounting for these wounds and the wounds he has had on the right fifth toe which ultimately ended up in a right fifth toe amputation 2 years ago [the last time he was in this clinic Objective Constitutional Sitting or standing Blood Pressure is within target range for patient.. Pulse regular and within target range for patient.Marland Kitchen Respirations regular, non-labored and within target range.. Temperature is normal and within the target range for the patient.Marland Kitchen Appears in no  distress. Vitals Time Taken: 9:06 AM, Height: 70 in, Weight: 274 lbs, BMI: 39.3, Temperature: 98.2 F, Pulse: 69 bpm, Respiratory Rate: 17 breaths/min, Blood Pressure: 118/69 mmHg, Capillary Blood Glucose: 117 mg/dl. General Notes: Wound exam; everything looks fully epithelialized here on the plantar wound which I think was the original. On the right lateral foot under illumination a very small vulnerable area in the middle part of his foot Integumentary (Hair, Skin) Wound #3 status is Open. Original cause of wound was Gradually Appeared. The date acquired was:  12/11/2019. The wound has been in treatment 4 weeks. The wound is located on the [place]. The wound measures 0.1cm length x 0.1cm width x 0.1cm depth; 0.008cm^2 area and 0.001cm^3 volume. There is no tunneling or undermining noted. There is a medium amount of serosanguineous drainage noted. The wound margin is distinct with the outline attached to the wound base. There is large (67-100%) red, pink granulation within the wound bed. There is no necrotic tissue within the wound bed. Wound #4 status is Open. Original cause of wound was Gradually Appeared. The date acquired was: 10/06/2021. The wound has been in treatment 1 weeks. The wound is located on the Right,Lateral Foot. The wound measures 0.1cm length x 0.1cm width x 0.1cm depth; 0.008cm^2 area and 0.001cm^3 volume. There is no tunneling or undermining noted. There is a medium amount of serosanguineous drainage noted. The wound margin is distinct with the outline attached to the wound base. There is large (67-100%) red, pink granulation within the wound bed. There is no necrotic tissue within the wound bed. 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 There was a T Contact Cast otal Procedure by Ricard Dillon., MD. Post procedure Diagnosis Wound #3: Same as Pre-Procedure Wound #4 Pre-procedure diagnosis of Wound #4 is a Diabetic Wound/Ulcer of the Lower Extremity located on the Right,Lateral Foot . There was a T Contact Cast otal Procedure by Ricard Dillon., MD. Post procedure Diagnosis Wound #4: Same as Pre-Procedure Plan Follow-up Appointments: Return Appointment in 1 week. - -with Dr. Dellia Nims Tuesday Other: - Halo=Supplies Bathing/ Shower/ Hygiene: May shower and wash wound with soap and water. Off-Loading: T Contact Cast to Right Lower Extremity otal Additional Orders / Instructions: Follow Nutritious Diet - -High Protein Diet -Keep monitoring blood sugar WOUND #3: - Foot Wound Laterality: Plantar, Right Cleanser: Soap and Water 1 x Per Week/30 Days Discharge Instructions: May shower and wash wound with dial antibacterial soap and water prior to dressing change. Prim Dressing: Maxorb Extra Calcium Alginate 2x2 in 1 x Per Week/30 Days ary Discharge Instructions: Apply calcium alginate to wound bed as instructed Secondary Dressing: Woven Gauze Sponge, Non-Sterile 4x4 in 1 x Per Week/30 Days Discharge Instructions: Apply over primary dressing as directed. Secondary Dressing: ABD Pad, 5x9 1 x Per Week/30 Days Discharge Instructions: Apply over primary dressing as directed. Secured With: 520M Medipore H Soft Cloth Surgical T ape, 4 x 10 (in/yd) 1 x Per Week/30 Days Discharge Instructions: Secure with tape as directed. WOUND #4: - Foot Wound Laterality: Right, Lateral Cleanser: Soap and Water 1 x Per Week/30 Days Discharge Instructions: May shower and wash wound with dial antibacterial soap and water prior to dressing change. Prim Dressing: Maxorb Extra Calcium Alginate 2x2 in 1 x Per Week/30 Days ary Discharge  Instructions: Apply calcium alginate to wound bed as instructed Secondary Dressing: Woven Gauze Sponge, Non-Sterile 4x4 in 1 x Per Week/30 Days Discharge Instructions: Apply over primary dressing as directed. Secondary Dressing: ABD Pad, 5x9 1 x Per Week/30 Days Discharge Instructions: Apply over primary dressing as directed. Secured With: 520M Medipore H Soft Cloth Surgical T ape, 4 x 10 (in/yd) 1 x Per Week/30 Days Discharge Instructions: Secure with tape as directed. #1  I use calcium alginate on the wound offloading and put him back in total contact cast so the areas could fully mature. 2. Spent a period of time talking to him about offloading this area. He showed me in the insole in the shoe he is wearing. It was already stained and indented. I do not think this is going to work by itself. The penultimate dressing here would be a diabetic shoe with a custom insole and I think it may come to that. 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 10/14/2021 8:45 A M Medical Record Number: [id number] Patient Account Number: [id number] Date of Birth/Sex: Treating RN: 06/07/1956 (65 y.o. Burnadette Pop, Lauren Primary Care Provider: Vikki Ports Other Clinician: Referring Provider: Treating Provider/Extender: Rudean Haskell, Newt Lukes in Treatment: 4 T Contact Cast Applied for Wound Assessment: otal Wound #4 Right,Lateral Foot Performed By: Physician Ricard Dillon., MD Post Procedure Diagnosis Same as Pre-procedure Electronic Signature(s) Signed: 10/14/2021 4:21:48 PM By: Linton Ham MD Signed: 10/15/2021 4:34:42 PM By: [name] Entered By: Rhae Hammock on 10/14/2021 09:35:53 -------------------------------------------------------------------------------- Total Contact Cast Details Patient Name: Date of Service: Eduardo [name] MES M. 10/14/2021 8:45 A M Medical Record Number: [id number] Patient Account Number: [id number] Date of Birth/Sex: Treating RN: 29-Apr-1956 (65 y.o. M) Primary Care Provider: Vikki Ports Other Clinician: Referring Provider: Treating Provider/Extender: Rudean Haskell, Newt Lukes in Treatment: 4 T Contact Cast Applied for Wound Assessment: otal Wound #3 Right,Plantar Foot Performed By: Physician Ricard Dillon., MD Post Procedure Diagnosis Same as Pre-procedure Electronic Signature(s) Signed: 10/14/2021 4:21:48 PM By: Linton Ham MD Entered By: Linton Ham on 10/14/2021 09:43:48 -------------------------------------------------------------------------------- SuperBill Details Patient Name: Date of Service: [name], [name] 10/14/2021 Medical Record Number: [id number] Patient Account Number: [id number] Date of Birth/Sex: Treating RN: [birth year] (65 y.o. 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 E11.51 Type 2 diabetes mellitus with diabetic peripheral angiopathy without gangrene Facility Procedures CPT4 Code: [id number] Description: [phone] - APPLY TOTAL CONTACT LEG CAST ICD-10 Diagnosis Description L97.518 Non-pressure chronic ulcer of other part of right foot with other specified seve Modifier: rity Quantity: 1 Physician Procedures : CPT4 Code Description Modifier [id number] - WC PHYS APPLY TOTAL CONTACT CAST ICD-10 Diagnosis Description ICD-10 Diagnosis Description  L97.518 Non-pressure chronic ulcer of other part of right foot with other specified severity Quantity: 1 Electronic Signature(s) Signed: 10/14/2021 4:21:48 PM By: Linton Ham MD Entered By: Linton Ham on 10/14/2021 09:48:29

## 2021-10-15 NOTE — Progress Notes (Signed)
RONDEY, FALLEN ([id number]) Visit Report for 10/14/2021 Arrival Information Details Patient Name: Date of Service: Eduardo [name] MES M. 10/14/2021 8:45 A M Medical Record Number: [id number] Patient Account Number: [id number] Date of Birth/Sex: Treating RN: 01-16-1956 (65 y.o. Eduardo [name] Primary Care Parish Dubose: Vikki Ports Other Clinician: Referring Shaida Route: Treating Eduardo [name]/Extender: Eduardo [name], Eduardo [name] in Treatment: 4 Visit Information History Since Last Visit Added or deleted any medications: No Patient Arrived: Ambulatory Any new allergies or adverse reactions: No Arrival Time: 09:05 Had a fall or experienced change in No Accompanied By: self activities of daily living that may affect Transfer Assistance: Manual risk of falls: Patient Identification Verified: Yes Signs or symptoms of abuse/neglect since last visito No Secondary Verification Process Completed: Yes Hospitalized since last visit: No Patient Requires Transmission-Based Precautions: No Implantable device outside of the clinic excluding No Patient Has Alerts: Yes cellular tissue based products placed in the center Patient Alerts: Patient on Blood Thinner since last visit: 08/14/21 ABI R=1.0 L=.84 Has Dressing in Place as Prescribed: Yes Has Footwear/Offloading in Place as Prescribed: Yes Right: T Contact Cast otal Pain Present Now: No Electronic Signature(s) Signed: 10/15/2021 4:34:42 PM By: Eduardo Hammock RN Entered By: Eduardo [name] on 10/14/2021 09:05:58 -------------------------------------------------------------------------------- Encounter Discharge Information Details Patient Name: Date of Service: Eduardo [name] MES M. 10/14/2021 8:45 A M Medical Record Number: [id number] Patient Account Number: [id number] Date of Birth/Sex: Treating RN: 1956/08/15 (66 y.o. Eduardo Pop, Eduardo [name] Primary Care Eduardo [name]: Vikki Ports Other Clinician: Referring Cardin Nitschke: Treating Eduardo [name]/Extender:  Eduardo [name] in Treatment: 4 Encounter Discharge Information Items Discharge Condition: Stable Ambulatory Status: Ambulatory Discharge Destination: Home Transportation: Private Auto Accompanied By: self Schedule Follow-up Appointment: Yes Clinical Summary of Care: Patient Declined Electronic Signature(s) Signed: 10/15/2021 4:34:42 PM By: Eduardo Hammock RN Entered By: Eduardo [name] on 10/14/2021 09:38:35 -------------------------------------------------------------------------------- Lower Extremity Assessment Details Patient Name: Date of Service: Eduardo [name] MES M. 10/14/2021 8:45 A M Medical Record Number: [id number] Patient Account Number: [id number] Date of Birth/Sex: Treating RN: [birth year] (65 y.o. Eduardo Pop, Eduardo [name] Primary Care Jeovani Weisenburger: Vikki Ports Other Clinician: Referring Eduardo [name]: Treating Eduardo [name]/Extender: Eduardo [name], Eduardo [name] in Treatment: 4 Edema Assessment Assessed: [Left: No] [Right: Yes] Edema: [Left: N] [Right: o] Calf Left: Right: Point of Measurement: 33 cm From Medial Instep 37.5 cm Ankle Left: Right: Point of Measurement: 9 cm From Medial Instep 23 cm Vascular Assessment Pulses: Dorsalis Pedis Palpable: [Right:Yes] Posterior Tibial Palpable: [Right:Yes] Electronic Signature(s) Signed: 10/15/2021 4:34:42 PM By: Eduardo Hammock RN Entered By: Eduardo [name] on 10/14/2021 09:06:21 -------------------------------------------------------------------------------- Multi Wound Chart Details Patient Name: Date of Service: Eduardo [name] MES M. 10/14/2021 8:45 A M Medical Record Number: [id number] Patient Account Number: [id number] Date of Birth/Sex: Treating RN: Apr 07, 1956 (66 y.o. M) Primary Care Maximino Cozzolino: Vikki Ports Other Clinician: Referring Laporscha Linehan: Treating Herma Uballe/Extender: Eduardo [name], Eduardo [name] in Treatment: 4 Vital Signs Height(in): 70 Capillary Blood Glucose(mg/dl):  117 Weight(lbs): 274 Pulse(bpm): 73 Body Mass Index(BMI): 85 Blood Pressure(mmHg): 118/69 Temperature(F): 98.2 Respiratory Rate(breaths/min): 17 Photos: [3:Right, Plantar Foot] [4:Right, Lateral Foot] [N/A:N/A N/A] Wound Location: [3:Gradually Appeared] [4:Gradually Appeared] [N/A:N/A] Wounding Event: [3:Diabetic Wound/Ulcer of the Lower] [4:Diabetic Wound/Ulcer of the Lower] [N/A:N/A] Primary Etiology: [3:Extremity Cataracts, Congestive Heart Failure,] [4:Extremity Cataracts, Congestive Heart Failure,] [N/A:N/A] Comorbid History: [3:Coronary Artery Disease, Hypertension, Myocardial Infarction, Peripheral Arterial Disease, Type II Diabetes, Osteomyelitis, Neuropathy 12/11/2019] [4:Coronary Artery Disease, Hypertension, Myocardial Infarction, Peripheral Arterial  Disease, Type II Diabetes,  Osteomyelitis, Neuropathy 10/06/2021] [N/A:N/A] Date Acquired: [3:4] [4:1] [N/A:N/A] [name] of Treatment: [3:Open] [4:Open] [N/A:N/A] Wound Status: [3:0.1x0.1x0.1] [4:0.1x0.1x0.1] [N/A:N/A] Measurements L x W x D (cm) [3:0.008] [4:0.008] [N/A:N/A] A (cm) : rea [3:0.001] [4:0.001] [N/A:N/A] Volume (cm) : [3:99.90%] [4:95.90%] [N/A:N/A] % Reduction in A [3:rea: 99.90%] [4:97.40%] [N/A:N/A] % Reduction in Volume: [3:Grade 2] [4:Grade 2] [N/A:N/A] Classification: [3:Medium] [4:Medium] [N/A:N/A] Exudate A mount: [3:Serosanguineous] [4:Serosanguineous] [N/A:N/A] Exudate Type: [3:red, brown] [4:red, brown] [N/A:N/A] Exudate Color: [3:Distinct, outline attached] [4:Distinct, outline attached] [N/A:N/A] Wound Margin: [3:Large (67-100%)] [4:Large (67-100%)] [N/A:N/A] Granulation A mount: [3:Red, Pink] [4:Red, Pink] [N/A:N/A] Granulation Quality: [3:None Present (0%)] [4:None Present (0%)] [N/A:N/A] Necrotic A mount: [3:Large (67-100%)] [4:Large (67-100%)] [N/A:N/A] Epithelialization: [3:T Contact Cast otal] [4:T Contact Cast otal] [N/A:N/A] Treatment Notes Wound #3 (Foot) Wound Laterality: Plantar,  Right Cleanser Soap and Water Discharge Instruction: May shower and wash wound with dial antibacterial soap and water prior to dressing change. Peri-Wound Care Topical Primary Dressing Maxorb Extra Calcium Alginate 2x2 in Discharge Instruction: Apply calcium alginate to wound bed as instructed Secondary Dressing Woven Gauze Sponge, Non-Sterile 4x4 in Discharge Instruction: Apply over primary dressing as directed. ABD Pad, 5x9 Discharge Instruction: Apply over primary dressing as directed. Secured With 20M Medipore H Soft Cloth Surgical T ape, 4 x 10 (in/yd) Discharge Instruction: Secure with tape as directed. Compression Wrap Compression Stockings Add-Ons Wound #4 (Foot) Wound Laterality: Right, Lateral Cleanser Soap and Water Discharge Instruction: May shower and wash wound with dial antibacterial soap and water prior to dressing change. Peri-Wound Care Topical Primary Dressing Maxorb Extra Calcium Alginate 2x2 in Discharge Instruction: Apply calcium alginate to wound bed as instructed Secondary Dressing Woven Gauze Sponge, Non-Sterile 4x4 in Discharge Instruction: Apply over primary dressing as directed. ABD Pad, 5x9 Discharge Instruction: Apply over primary dressing as directed. Secured With 20M Medipore H Soft Cloth Surgical T ape, 4 x 10 (in/yd) Discharge Instruction: Secure with tape as directed. Compression Wrap Compression Stockings Add-Ons Electronic Signature(s) Signed: 10/14/2021 4:21:48 PM By: Linton Ham MD Entered By: Linton Ham on 10/14/2021 09:43:37 -------------------------------------------------------------------------------- Multi-Disciplinary Care Plan Details Patient Name: Date of Service: Eduardo [name] MES M. 10/14/2021 8:45 A M Medical Record Number: [id number] Patient Account Number: [id number] Date of Birth/Sex: Treating RN: 1956/01/10 (65 y.o. Eduardo Pop, Eduardo [name] Primary Care Sheleen Conchas: Vikki Ports Other Clinician: Referring  Lindzy Rupert: Treating Dory Demont/Extender: Eduardo [name], Eduardo [name] in Treatment: 4 Active Inactive Nutrition Nursing Diagnoses: Impaired glucose control: actual or potential Goals: Patient/caregiver verbalizes understanding of need to maintain therapeutic glucose control per primary care physician Date Initiated: 09/16/2021 Target Resolution Date: 10/25/2021 Goal Status: Active Interventions: Assess patient nutrition upon admission and as needed per policy Provide education on elevated blood sugars and impact on wound healing Provide education on nutrition Treatment Activities: Obtain HgA1c : 09/16/2021 Notes: Wound/Skin Impairment Nursing Diagnoses: Impaired tissue integrity Goals: Patient/caregiver will verbalize understanding of skin care regimen Date Initiated: 09/16/2021 Target Resolution Date: 10/25/2021 Goal Status: Active Ulcer/skin breakdown will have a volume reduction of 30% by week 4 Date Initiated: 09/16/2021 Target Resolution Date: 10/25/2021 Goal Status: Active Interventions: Assess patient/caregiver ability to perform ulcer/skin care regimen upon admission and as needed Assess ulceration(s) every visit Provide education on ulcer and skin care Treatment Activities: Topical wound management initiated : 09/16/2021 Notes: Electronic Signature(s) Signed: 10/15/2021 4:34:42 PM By: Eduardo Hammock RN Entered By: Eduardo [name] on 10/14/2021 09:37:19 -------------------------------------------------------------------------------- Pain Assessment Details Patient Name: Date of Service: Eduardo [name] MES M. 10/14/2021 8:45 A M Medical Record  Number: [id number] Patient Account Number: [id number] Date of Birth/Sex: Treating RN: [birth year] (65 y.o. Eduardo Pop, Eduardo [name] Primary Care Amatullah Christy: Vikki Ports Other Clinician: Referring Shoji Pertuit: Treating Kishan Wachsmuth/Extender: Eduardo [name], Eduardo [name] in Treatment: 4 Active Problems Location of Pain  Severity and Description of Pain Patient Has Paino No Site Locations Pain Management and Medication Current Pain Management: Electronic Signature(s) Signed: 10/15/2021 4:34:42 PM By: Eduardo Hammock RN Entered By: Eduardo [name] on 10/14/2021 09:06:11 -------------------------------------------------------------------------------- Patient/Caregiver Education Details Patient Name: Date of Service: Eduardo [name] MES M. 12/6/2022andnbsp8:45 [place] Record Number: [id number] Patient Account Number: [id number] Date of Birth/Gender: Treating RN: 06/10/1956 (65 y.o. Erie Noe Primary Care Physician: Vikki Ports Other Clinician: Referring Physician: Treating Physician/Extender: Eduardo [name] in Treatment: 4 Education Assessment Education Provided To: Patient Education Topics Provided Wound/Skin Impairment: Methods: Explain/Verbal Responses: Reinforcements needed, State content correctly Electronic Signature(s) Signed: 10/15/2021 4:34:42 PM By: Eduardo Hammock RN Entered By: Eduardo [name] on 10/14/2021 09:37:36 -------------------------------------------------------------------------------- Wound Assessment Details Patient Name: Date of Service: Eduardo [name] MES M. 10/14/2021 8:45 A M Medical Record Number: [id number] Patient Account Number: [id number] Date of Birth/Sex: Treating RN: [birth year] (65 y.o. Eduardo Pop, Eduardo [name] Primary Care Marcanthony Sleight: Vikki Ports Other Clinician: Referring Odysseus Cada: Treating Jackqueline Aquilar/Extender: Eduardo [name], Eduardo [name] in Treatment: 4 Wound Status Wound Number: 3 Primary Diabetic Wound/Ulcer of the Lower Extremity Etiology: Wound Location: Right, Plantar Foot Wound Open Wounding Event: Gradually Appeared Status: Date Acquired: 12/11/2019 Comorbid Cataracts, Congestive Heart Failure, Coronary Artery Disease, [name] Of Treatment: 4 History: Hypertension, Myocardial Infarction, Peripheral Arterial  Disease, Clustered Wound: No Type II Diabetes, Osteomyelitis, Neuropathy Photos Wound Measurements Length: (cm) 0.1 Width: (cm) 0.1 Depth: (cm) 0.1 Area: (cm) 0.008 Volume: (cm) 0.001 % Reduction in Area: 99.9% % Reduction in Volume: 99.9% Epithelialization: Large (67-100%) Tunneling: No Undermining: No Wound Description Classification: Grade 2 Wound Margin: Distinct, outline attached Exudate Amount: Medium Exudate Type: Serosanguineous Exudate Color: red, brown Wound Bed Granulation Amount: Large (67-100%) Granulation Quality: Red, Pink Necrotic Amount: None Present (0%) Treatment Notes Wound #3 (Foot) Wound Laterality: Plantar, Right Cleanser Soap and Water Discharge Instruction: May shower and wash wound with dial antibacterial soap and water prior to dressing change. Peri-Wound Care Topical Primary Dressing Maxorb Extra Calcium Alginate 2x2 in Discharge Instruction: Apply calcium alginate to wound bed as instructed Secondary Dressing Woven Gauze Sponge, Non-Sterile 4x4 in Discharge Instruction: Apply over primary dressing as directed. ABD Pad, 5x9 Discharge Instruction: Apply over primary dressing as directed. Secured With 2M Medipore H Soft Cloth Surgical T ape, 4 x 10 (in/yd) Discharge Instruction: Secure with tape as directed. Compression Wrap Compression Stockings Add-Ons Electronic Signature(s) Signed: 10/15/2021 4:34:42 PM By: Eduardo Hammock RN Entered By: Eduardo [name] on 10/14/2021 09:26:27 -------------------------------------------------------------------------------- Wound Assessment Details Patient Name: Date of Service: Eduardo [name] MES M. 10/14/2021 8:45 A M Medical Record Number: [id number] Patient Account Number: [id number] Date of Birth/Sex: Treating RN: 09-20-1956 (65 y.o. Eduardo Pop, Eduardo [name] Primary Care Areeb Corron: Vikki Ports Other Clinician: Referring Xiomar Crompton: Treating Elgene Coral/Extender: Eduardo [name], Eduardo [name] in  Treatment: 4 Wound Status Wound Number: 4 Primary Diabetic Wound/Ulcer of the Lower Extremity Etiology: Wound Location: Right, Lateral Foot Wound Open Wounding Event: Gradually Appeared Status: Date Acquired: 10/06/2021 Comorbid Cataracts, Congestive Heart Failure, Coronary Artery Disease, [name] Of Treatment: 1 History: Hypertension, Myocardial Infarction, Peripheral Arterial Disease, Clustered Wound: No Type II Diabetes, Osteomyelitis, Neuropathy Photos Wound Measurements Length: (cm) 0.1 Width: (cm) 0.1  Depth: (cm) 0.1 Area: (cm) 0.008 Volume: (cm) 0.001 % Reduction in Area: 95.9% % Reduction in Volume: 97.4% Epithelialization: Large (67-100%) Tunneling: No Undermining: No Wound Description Classification: Grade 2 Wound Margin: Distinct, outline attached Exudate Amount: Medium Exudate Type: Serosanguineous Exudate Color: red, brown Foul Odor After Cleansing: No Slough/Fibrino No Wound Bed Granulation Amount: Large (67-100%) Exposed Structure Granulation Quality: Red, Pink Fascia Exposed: No Necrotic Amount: None Present (0%) Fat Layer (Subcutaneous Tissue) Exposed: No Tendon Exposed: No Muscle Exposed: No Joint Exposed: No Bone Exposed: No Treatment Notes Wound #4 (Foot) Wound Laterality: Right, Lateral Cleanser Soap and Water Discharge Instruction: May shower and wash wound with dial antibacterial soap and water prior to dressing change. Peri-Wound Care Topical Primary Dressing Maxorb Extra Calcium Alginate 2x2 in Discharge Instruction: Apply calcium alginate to wound bed as instructed Secondary Dressing Woven Gauze Sponge, Non-Sterile 4x4 in Discharge Instruction: Apply over primary dressing as directed. ABD Pad, 5x9 Discharge Instruction: Apply over primary dressing as directed. Secured With 54M Medipore H Soft Cloth Surgical T ape, 4 x 10 (in/yd) Discharge Instruction: Secure with tape as directed. Compression Wrap Compression  Stockings Add-Ons Electronic Signature(s) Signed: 10/15/2021 4:34:42 PM By: Eduardo Hammock RN Entered By: Eduardo [name] on 10/14/2021 09:26:54 -------------------------------------------------------------------------------- Vitals Details Patient Name: Date of Service: Eduardo [name] MES M. 10/14/2021 8:45 A M Medical Record Number: [id number] Patient Account Number: [id number] Date of Birth/Sex: Treating RN: [birth year] (65 y.o. Eduardo Pop, Eduardo [name] Primary Care Ladell Lea: Vikki Ports Other Clinician: Referring Adelynn Gipe: Treating Henderson Frampton/Extender: Eduardo [name], Eduardo [name] in Treatment: 4 Vital Signs Time Taken: 09:06 Temperature (F): 98.2 Height (in): 70 Pulse (bpm): 69 Weight (lbs): 274 Respiratory Rate (breaths/min): 17 Body Mass Index (BMI): 39.3 Blood Pressure (mmHg): 118/69 Capillary Blood Glucose (mg/dl): 117 Reference Range: 80 - 120 mg / dl Electronic Signature(s) Signed: 10/15/2021 4:34:42 PM By: Eduardo Hammock RN Entered By: Eduardo [name] on 10/14/2021 09:11:18

## 2021-10-20 ENCOUNTER — Encounter: Payer: Self-pay | Admitting: Student

## 2021-10-20 ENCOUNTER — Other Ambulatory Visit: Payer: Self-pay

## 2021-10-20 ENCOUNTER — Ambulatory Visit: Payer: Medicare Other | Admitting: Student

## 2021-10-20 VITALS — BP 130/63 | HR 71 | Temp 97.6°F | Ht 69.5 in | Wt 282.0 lb

## 2021-10-20 DIAGNOSIS — I5022 Chronic systolic (congestive) heart failure: Secondary | ICD-10-CM

## 2021-10-20 DIAGNOSIS — S80812A Abrasion, left lower leg, initial encounter: Secondary | ICD-10-CM

## 2021-10-20 NOTE — Progress Notes (Signed)
Primary Physician/Referring:  Rita Ohara, MD  Patient ID: Eduardo [name], male    DOB: 30-Sep-1956, 65 y.o.   MRN: [id number]  No chief complaint on file.  HPI:    Eduardo [name]  is a 65 y.o. Caucasian male  with controlled type 2 diabetes, hypertension, hyperlipidemia, coronary artery disease by angiography on 09/08/2016 revealing severe triple-vessel CAD with no targets for CABG, he presented with new onset left bundle branch block and acute fulminant pulmonary edema needing intubation on 10/11/2020, emergent cardiac catheterization essentially revealing progression of severe native vessel disease with no significant option for revascularization.  Although LAD was felt to be amenable for potential revascularization, it was unchanged from prior cardiac catheterization and RCA was felt to be the culprit, in view of diffuse disease medical management was recommended.  Past medical history is also significant for morbid obesity, diabetes mellitus with stage III AV chronic kidney disease, mild carotid atherosclerosis, right SFA occlusion S/P complex PV angiogram on 02/28/2019 with PTA to occluded right SFA with implantation of 3 overlapping 6.0 x 120 x2; 6.0 x 100 mm Eluvia DES for critical right leg ischemia now resolved. 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 He has mild disease in the left leg.  PAD has remained stable. Due to frequent PVCs, he was also started on amiodarone  prophylactically.   Patient was hospitalized 12/28/2020-01/02/2021 for acute on chronic systolic heart failure.  Patient was diuresed well, and given ischemic cardiomyopathy as etiology of heart failure attempted high risk PCI to LAD CTO, however it was unsuccessful. Conservative medical management was recommended, with consideration of palliative care involvement.  Patient was previously on spironolactone, however due to renal insufficiency this has been discontinued.  Patient presents for urgent visit with  concerns of swelling on his left leg.  Patient recently had breakfast active replacement right foot for chronic wound.  Last week he woke up and noticed an area on his posterior left calf with an abrasion and surrounding swelling, patient's girlfriend raised concern that it may be a blood clot.  In the interim patient was seen by wound management who felt the abrasion and swelling were likely due to injury from the boot on his right leg while sleeping.  Since last week swelling has resolved and abrasion is healing well according to patient.  Denies chest pain, palpitations, syncope, near syncope, orthopnea, PND.    Past Medical History:  Diagnosis Date   CHF (congestive heart failure) (HCC)    Chronic kidney disease    stage 3   Colon polyp    Coronary artery disease    Diabetes mellitus 1987   under care of Dr. Chalmers Cater.  On insulin since 96 (off and on)   Diabetic retinopathy    Dupuytren contracture    R hand, s/p injection (Dr. Lenon Curt)   Essential hypertension, benign    Essential hypertension, benign 02/06/2019   Frequency of urination and polyuria    Hypertension    Myocardial infarction [facility])    denies   Neuromuscular disorder ([place])    Diabetic neuropathy   Osteomyelitis ([place])    right foot   Other testicular hypofunction    Peripheral arterial disease ([place]) 10/28/2012   Peritoneal abscess ([place]) 6/08   and buttock.   Pneumonia    Polydipsia    Proteinuria    Pure hyperglyceridemia    Subacute osteomyelitis, right ankle and foot (HCC)    Wears glasses    Past Surgical  History:  Procedure Laterality Date   ABDOMINAL AORTAGRAM N/A 04/18/2012   Procedure: ABDOMINAL Maxcine Ham;  Surgeon: Angelia Mould, MD;  Location: [facility] CATH LAB;  Service: Cardiovascular;  Laterality: N/A;   AMPUTATION Right 05/19/2019   Procedure: RIGHT FOOT 5TH RAY AMPUTATION;  Surgeon: Newt Minion, MD;  Location: [place];  Service: Orthopedics;  Laterality: Right;   CARDIAC CATHETERIZATION N/A  09/08/2016   Procedure: Left Heart Cath and Coronary Angiography;  Surgeon: Adrian Prows, MD;  Location: [place] CV LAB;  Service: Cardiovascular;  Laterality: N/A;   CATARACT EXTRACTION, BILATERAL  09/2017, 10/2017   Dr. Herbert Deaner   COLONOSCOPY W/ BIOPSIES AND POLYPECTOMY     CORONARY/GRAFT ACUTE MI REVASCULARIZATION N/A 10/11/2020   Procedure: Coronary/Graft Acute MI Revascularization;  Surgeon: Adrian Prows, MD;  Location: [place] CV LAB;  Service: Cardiovascular;  Laterality: N/A;   LEFT HEART CATH N/A 12/31/2020   Procedure: Left Heart Cath;  Surgeon: Adrian Prows, MD;  Location: [place] CV LAB;  Service: Cardiovascular;  Laterality: N/A;   LEFT HEART CATH AND CORONARY ANGIOGRAPHY N/A 10/11/2020   Procedure: LEFT HEART CATH AND CORONARY ANGIOGRAPHY;  Surgeon: Adrian Prows, MD;  Location: [place] CV LAB;  Service: Cardiovascular;  Laterality: N/A;   LOWER EXTREMITY ANGIOGRAM Bilateral 04/18/2012   Procedure: LOWER EXTREMITY ANGIOGRAM;  Surgeon: Angelia Mould, MD;  Location: [facility] CATH LAB;  Service: Cardiovascular;  Laterality: Bilateral;  bilat lower extrem angio   LOWER EXTREMITY ANGIOGRAPHY Bilateral 05/02/2019   Procedure: LOWER EXTREMITY ANGIOGRAPHY;  Surgeon: Adrian Prows, MD;  Location: [place] CV LAB;  Service: Cardiovascular;  Laterality: Bilateral;   LOWER EXTREMITY ANGIOGRAPHY Bilateral 02/28/2019   Procedure: LOWER EXTREMITY ANGIOGRAPHY;  Surgeon: Adrian Prows, MD;  Location: [place] CV LAB;  Service: Cardiovascular;  Laterality: Bilateral;   macular photocoagulation     (eye treatments for diabetic retinopathy)-Dr. Zigmund Daniel   PERIPHERAL VASCULAR INTERVENTION  02/28/2019   Procedure: PERIPHERAL VASCULAR INTERVENTION;  Surgeon: Adrian Prows, MD;  Location: [place] CV LAB;  Service: Cardiovascular;;   VENTRICULAR ASSIST DEVICE INSERTION N/A 12/31/2020   Procedure: VENTRICULAR ASSIST DEVICE INSERTION;  Surgeon: Adrian Prows, MD;  Location: [place] CV LAB;  Service:  Cardiovascular;  Laterality: N/A;    Family History  Problem Relation Age of Onset   Diabetes Mother    Hearing loss Mother    Hypertension Mother    Hyperlipidemia Mother    Heart disease Mother    Varicose Veins Mother    Varicose Veins Father    Dementia Father    Hyperlipidemia Brother    Diabetes Maternal Grandmother    Social History   Tobacco Use   Smoking status: Former    Packs/day: 1.00    Years: 30.00    Pack years: 30.00    Types: Cigarettes    Quit date: 01/08/2012    Years since quitting: 9.7   Smokeless tobacco: Never  Substance Use Topics   Alcohol use: Not Currently    Comment: rare   Marital Status: Widowed ROS  Review of Systems  Constitutional: Negative for malaise/fatigue and weight gain.  Cardiovascular:  Positive for leg swelling (chronic, stable). Negative for chest pain, claudication, near-syncope, orthopnea, palpitations, paroxysmal nocturnal dyspnea and syncope.  Respiratory:  Negative for shortness of breath.   Neurological:  Negative for dizziness.  Objective  Blood pressure 130/63, pulse 71, temperature 97.6 F (36.4 C), temperature source Temporal, height 5' 9.5" (1.765 m), weight 282 lb (127.9 kg), SpO2 97 %.  Vitals with BMI 10/20/2021 08/20/2021 06/26/2021  Height 5' 9.5" 5' 9.5" 5' 9.5"  Weight 282 lbs 276 lbs 279 lbs  BMI 41.06 35.36 14.43  Systolic 154 008 676  Diastolic 63 59 74  Pulse 71 60 77     Physical Exam Vitals reviewed.  Constitutional:      Appearance: He is well-developed.     Comments: Morbidly obese, in no acute distress  Neck:     Thyroid: No thyromegaly.     Comments: Short neck and difficult to evaluate JVP Cardiovascular:     Pulses:          Carotid pulses are 2+ on the right side and 2+ on the left side.      Popliteal pulses are 0 on the right side and 2+ on the left side.       Dorsalis pedis pulses are 2+ on the left side. Right dorsalis pedis pulse not accessible.       Posterior tibial pulses are  1+ on the left side. Right posterior tibial pulse not accessible.     Heart sounds: No murmur heard.   No gallop.     Comments: Unable to evaluate right pedal pulses as boot is in place by wound management Pulmonary:     Effort: Pulmonary effort is normal.     Breath sounds: No wheezing, rhonchi or rales.  Abdominal:     Comments: Obese. Pannus present  Musculoskeletal:        General: No tenderness.     Right lower leg: Right lower leg edema: Unable to evaluate due to boot in place by wound management.     Left lower leg: No edema.  Skin:    General: Skin is dry.       Neurological:     Mental Status: He is alert.   Laboratory examination:   Recent Labs    11/28/20 0930 12/05/20 1016 12/19/20 1031 12/28/20 1108 12/31/20 0318 01/01/21 0045 01/02/21 0038 01/14/21 1150 02/03/21 1532 05/05/21 1503 05/30/21 1507  NA 147* 144 144   < > 142 136 139   < > 144 142 146*  K 4.9 4.8 4.8   < > 4.9 5.0 3.9   < > 5.2 5.3* 5.1  CL 106 103 105   < > 106 105 105   < > 104 104 105  CO2 _0 < > 28 20* 24   < > _1 GLUCOSE 84 113* 159*   < > 68* 182* 46*   < > 134* 105* 111*  BUN 35* 38* 47*   < > 32* 25* 26*   < > 35* 55* 42*  CREATININE 1.55* 1.63* 1.82*   < > 1.75* 1.50* 1.47*   < > 1.77* 1.79* 1.45*  CALCIUM 9.3 8.8 9.3   < > 8.8* 8.5* 8.9   < > 9.1 9.2 9.4  GFRNONAA 46* 44* 38*   < > 43* 51* 53*  --   --   --   --   GFRAA 54* 50* 44*  --   --   --   --   --   --   --   --    < > = values in this interval not displayed.   CrCl cannot be calculated (Patient's most recent lab result is older than the maximum 21 days allowed.).  CMP Latest Ref Rng & Units 05/30/2021 05/05/2021 02/03/2021  Glucose 65 - 99 mg/dL 111(H)  105(H) 134(H)  BUN 8 - 27 mg/dL 42(H) 55(H) 35(H)  Creatinine 0.76 - 1.27 mg/dL 1.45(H) 1.79(H) 1.77(H)  Sodium 134 - 144 mmol/L 146(H) 142 144  Potassium 3.5 - 5.2 mmol/L 5.1 5.3(H) 5.2  Chloride 96 - 106 mmol/L 105 104 104  CO2 20 - 29 mmol/L _0 Calcium 8.6 - 10.2 mg/dL 9.4 9.2 9.1  Total Protein 6.5 - 8.1 g/dL - - -  Total Bilirubin 0.3 - 1.2 mg/dL - - -  Alkaline Phos 38 - 126 U/L - - -  AST 15 - 41 U/L - - -  ALT 0 - 44 U/L - - -   CBC Latest Ref Rng & Units 05/05/2021 01/01/2021 12/28/2020  WBC 3.4 - 10.8 x10E3/uL 7.5 8.0 9.9  Hemoglobin 13.0 - 17.7 g/dL 16.8 14.9 17.2(H)  Hematocrit 37.5 - 51.0 % 50.6 45.4 52.9(H)  Platelets 150 - 450 x10E3/uL 164 136(L) 186   Lipid Panel     Component Value Date/Time   CHOL 96 (L) 08/18/2021 1538   TRIG 138 08/18/2021 1538   HDL 30 (L) 08/18/2021 1538   CHOLHDL 3.9 06/26/2021 0949   CHOLHDL 4.4 09/28/2011 1358   VLDL 21 09/28/2011 1358   LDLCALC 42 08/18/2021 1538    HEMOGLOBIN A1C Lab Results  Component Value Date   HGBA1C 6.5 03/19/2021   MPG 182.9 10/11/2020   TSH No results for input(s): TSH in the last 8760 hours.  External Labs:  Glucose Random 453.000 M 09/08/2019 BUN 38.000 M 09/08/2019 Creatinine, Serum 1.340 MG/ 09/08/2019  PSA 3.300 06/12/2019 01/19/2018: Cholesterol 115, triglycerides 112, HDL 39, LDL 54.  Glucose 125, creatinine 1.16, potassium 5.2, EGFR 67, CMP otherwise normal.  CBC normal.  TSH 1.5.  Allergies   Allergies  Allergen Reactions   Shellfish-Derived Products Nausea And Vomiting and Other (See Comments)    Mussels cause severe nausea and vomiting    Latex Rash   Tape Rash and Other (See Comments)    Caused issues with the skin   Testosterone Rash    Other reaction(s): did not feel well    Medications Prior to Visit:   Outpatient Medications Prior to Visit  Medication Sig Dispense Refill   acetaminophen (TYLENOL) 325 MG tablet Take 650 mg by mouth every 6 (six) hours as needed for mild pain (or headaches).     aspirin 81 MG chewable tablet Chew 1 tablet (81 mg total) by mouth daily.     B-D UF III MINI PEN NEEDLES 31G X 5 MM MISC 6 (six) times daily.  0   Bioflavonoid Products (ESTER C PO) Take 1,000 mg by mouth daily.     carvedilol  (COREG) 3.125 MG tablet TAKE 1 TABLET BY MOUTH  TWICE DAILY WITH MEALS 180 tablet 3   Cholecalciferol (VITAMIN D) 2000 units CAPS Take 2,000 Units by mouth daily.     clopidogrel (PLAVIX) 75 MG tablet TAKE 1 TABLET BY MOUTH  DAILY 90 tablet 1   Continuous Blood Gluc Sensor (FREESTYLE LIBRE 14 DAY SENSOR) MISC every 14 (fourteen) days.     ENTRESTO 24-26 MG TAKE 1 TABLET BY MOUTH  TWICE DAILY 180 tablet 1   ezetimibe (ZETIA) 10 MG tablet Take 1 tablet (10 mg total) by mouth daily. 90 tablet 3   gemfibrozil (LOPID) 600 MG tablet Take 600 mg by mouth 2 (two) times daily before a meal.     HUMALOG KWIKPEN 100 UNIT/ML KiwkPen Inject 0-10 Units into the skin See admin  instructions. Inject 0-10 units into the skin three times a day, per sliding scale- based on BGL >100  1   HUMULIN N KWIKPEN 100 UNIT/ML Kiwkpen Inject 30 Units into the skin See admin instructions. Inject 30 units into the skin before breakfast and 15 U before supper  1   isosorbide mononitrate (IMDUR) 60 MG 24 hr tablet TAKE 1 TABLET BY MOUTH  DAILY 90 tablet 1   MOUNJARO 2.5 MG/0.5ML Pen Inject 2.5 mg into the skin once a week.     Multiple Vitamin (MULTIVITAMIN WITH MINERALS) TABS tablet Take 1 tablet by mouth daily.     nitroGLYCERIN (NITROSTAT) 0.4 MG SL tablet Place 1 tablet (0.4 mg total) under the tongue every 5 (five) minutes as needed for chest pain. 15 tablet 3   Omega-3 Fatty Acids (FISH OIL PO) Take 350 mg by mouth daily with breakfast.     Probiotic Product (PROBIOTIC-10 PO) Take 1 capsule by mouth daily.     rosuvastatin (CRESTOR) 20 MG tablet Take 1 tablet (20 mg total) by mouth daily. 90 tablet 3   torsemide (DEMADEX) 20 MG tablet TAKE 1 TABLET BY MOUTH  TWICE DAILY AT 10 AM AND 4  PM 180 tablet 1   doxycycline (VIBRA-TABS) 100 MG tablet Take 1 tablet (100 mg total) by mouth 2 (two) times daily. 20 tablet 0   empagliflozin (JARDIANCE) 10 MG TABS tablet Take 1 tablet (10 mg total) by mouth daily. (Patient not taking:  Reported on 10/20/2021) 90 tablet 3   No facility-administered medications prior to visit.   Final Medications at End of Visit    Current Meds  Medication Sig   acetaminophen (TYLENOL) 325 MG tablet Take 650 mg by mouth every 6 (six) hours as needed for mild pain (or headaches).   aspirin 81 MG chewable tablet Chew 1 tablet (81 mg total) by mouth daily.   B-D UF III MINI PEN NEEDLES 31G X 5 MM MISC 6 (six) times daily.   Bioflavonoid Products (ESTER C PO) Take 1,000 mg by mouth daily.   carvedilol (COREG) 3.125 MG tablet TAKE 1 TABLET BY MOUTH  TWICE DAILY WITH MEALS   Cholecalciferol (VITAMIN D) 2000 units CAPS Take 2,000 Units by mouth daily.   clopidogrel (PLAVIX) 75 MG tablet TAKE 1 TABLET BY MOUTH  DAILY   Continuous Blood Gluc Sensor (FREESTYLE LIBRE 14 DAY SENSOR) MISC every 14 (fourteen) days.   ENTRESTO 24-26 MG TAKE 1 TABLET BY MOUTH  TWICE DAILY   ezetimibe (ZETIA) 10 MG tablet Take 1 tablet (10 mg total) by mouth daily.   gemfibrozil (LOPID) 600 MG tablet Take 600 mg by mouth 2 (two) times daily before a meal.   HUMALOG KWIKPEN 100 UNIT/ML KiwkPen Inject 0-10 Units into the skin See admin instructions. Inject 0-10 units into the skin three times a day, per sliding scale- based on BGL >100   HUMULIN N KWIKPEN 100 UNIT/ML Kiwkpen Inject 30 Units into the skin See admin instructions. Inject 30 units into the skin before breakfast and 15 U before supper   isosorbide mononitrate (IMDUR) 60 MG 24 hr tablet TAKE 1 TABLET BY MOUTH  DAILY   MOUNJARO 2.5 MG/0.5ML Pen Inject 2.5 mg into the skin once a week.   Multiple Vitamin (MULTIVITAMIN WITH MINERALS) TABS tablet Take 1 tablet by mouth daily.   nitroGLYCERIN (NITROSTAT) 0.4 MG SL tablet Place 1 tablet (0.4 mg total) under the tongue every 5 (five) minutes as needed for chest pain.  Omega-3 Fatty Acids (FISH OIL PO) Take 350 mg by mouth daily with breakfast.   Probiotic Product (PROBIOTIC-10 PO) Take 1 capsule by mouth daily.    rosuvastatin (CRESTOR) 20 MG tablet Take 1 tablet (20 mg total) by mouth daily.   torsemide (DEMADEX) 20 MG tablet TAKE 1 TABLET BY MOUTH  TWICE DAILY AT 10 AM AND 4  PM   Radiology:   DG CHEST PORT 1 VIEW 10/12/2020: COMPARISON:  10/11/2020 FINDINGS: Vascular congestion with bilateral lower lung zone airspace opacities are without change. Upper lungs are clear. Suspect small effusions.  No pneumothorax. Endotracheal tube, right internal jugular central venous line and nasal/orogastric tube are stable. IMPRESSION: 1. No change from the previous day's exam. 2. Persistent vascular congestion and lower lung zone airspace opacities, the latter finding likely due to a combination of pleural fluid with atelectasis, with a possible component of either edema or infection.   Cardiac Studies:     Exercise sestamibi stress test 08/17/2016: 1. Resting EKG demonstrates normal sinus rhythm, left axis deviation, left plantar fascicular block.  Poor R-wave progression, ulnar disease pattern.  Nonspecific ST-T abnormality, cannot exclude lateral ischemia.  Stress EKG is equivocal for ischemia, patient developing atypical left otherwise for with exercise which reverted back to baseline immediately on combination of the stress test less than 60 seconds.  There was no additional ST-T wave changes of ischemia. Patient exercised on Bruce protocol for 4:25 minutes and achieved 5.45 METS. Stress test terminated due to 89 % MPHR achieved (Target HR >85%). Symptoms included dizziness.  2.  2-Day protocol followed. Perfusion imaging studies demonstrate large sized severe perfusion defect involving the inferior, anterior and anterolateral consistent with inferior wall scar with moderate peri-infarct ischemia and severe anterior and anteroapical reversible ischemia extending from the base towards the apex.  Left ventricular systolic function calculating by QGS was markedly depressed at 26%.  This is a high risk study, consider  further cardiac work-up.   Carotid artery duplex 02/16/2018: No hemodynamically significant arterial disease in the internal carotid artery bilaterally. Mild heterogenous plaque noted.  Antegrade right vertebral artery flow. Antegrade left vertebral artery flow. Compared to the study done on 09/01/2016, left carotid noted as occlusion is an error. This may perhaps be due to low velocity noted in both carotid arteries and may indicate low systemic BP or reduced cardiac output. Clinical correlation recommended.  Peripheral arteriogram 02/28/2019: Pelvic aortogram with limited bifemoral arteriogram revealed patent distal abdominal aorta without aneurysm, patent, bilateral iliac and common femoral vessels. Right SFA is occluded in the ostium and reconstitutes outside of the Hunter's canal.  Below the right knee there is two-vessel runoff, AT is occluded.  Left leg was not studied beyond left common femoral artery. Successful PTA and stenting of right SFA, prolonged procedure, difficult procedure with use of multiple balloons, guidewires and catheters.  100% stenosis reduced to 0% with implantation of 3 overlapping 6.0 x 120 x2; 6.0 x 100 mm Eluvia DES. 180 mL contrast utilized.  Peripheral arteriogram 05/02/2019: Right common femoral artery and right proximal SFA previously placed stent widely patent.  Right iliac artery shows mild disease. Left iliac artery and left femoral arteries show very mild disease.  There is two-vessel runoff in the left leg, left AT is occluded. Intermediate stenosis noted in the left distal and proximal SFA, pressure pullback reveals no significant gradient.   ABI 08/09/2019: This exam reveals normal perfusion of the right lower extremity (ABI). This exam reveals normal perfusion of the left  lower extremity (ABI).  The ABI may be falsely elevated due to medial calcinosis from diabetes. No significant change since 05/11/2019. Patient has h/o right SFA stenting.   Left  Heart Catheterization 10/11/20:  LV: Severely dilated.  Global hypokinesis.  Hand contrast injection hence not fully adequately visualized.  LVEF 15 to 20%.  EDP markedly elevated at 29 mmHg.  No pressure gradient across the aortic valve. Left main: Normal. LAD: Severely diffusely diseased.  Gives origin to large D1 which gives collaterals to the LAD and 2 smaller diagonals, LAD is occluded after the origin of D1, anatomy compared to prior in 2017 reveals progression of diffuse disease.  There are ipsilateral and contralateral collaterals to the LAD. CX: Moderate sized vessel, giving origin to large OM1.  OM1 is occluded in the ostium as was noted previously and has ipsilateral collaterals.  OM1 is diffusely diseased and faintly filled. RCA: Moderate disease in the proximal and mid segment.  At the bifurcation of PDA and PL, there is a high-grade 90% stenosis.  The bifurcation is also involved with at least a 90% stenosis in the PDA and a 60 to 70% stenosis in the PL branch.  There is no target as distally as the PL branch which is large is occluded distally and that was new from 2017.  There are faint collaterals noted from the left to the RCA.   Impression: Severe native vessel three-vessel coronary artery disease with no significant targets for revascularization, LAD may be amenable for revascularization but I am not sure that this would help, anatomy similar to 2017 but progression to diffuse disease.  Patient is extremely ill with high risk for mortality.  He also has underlying stage III-IV kidney disease and contrast nephropathy needs to be evaluated further in view of contrast load of 70 mL.  Echocardiogram 12/29/2020: 1. Left ventricular ejection fraction, by estimation, is <20%. The left ventricle has severely decreased function. The left ventricle demonstrates regional wall motion abnormalities (see scoring diagram/findings for description). The left ventricular internal cavity size was  moderately dilated. There is mild left ventricular hypertrophy. Left ventricular diastolic parameters are consistent with Grade II diastolic dysfunction (pseudonormalization). Elevated left atrial pressure. Akinetic inferolateral  and apical myocardium. Moderate anteroseptal hypokinesis.   2. Right ventricular systolic function is low normal. The right ventricular size is normal.   3. The mitral valve is grossly normal. Mild to moderate mitral valve regurgitation.   4. The aortic valve is tricuspid. Aortic valve regurgitation is not visualized.   5. The inferior vena cava is dilated in size with <50% respiratory variability, suggesting right atrial pressure of 15 mmHg.   6. Compared to previous study in 10/2020, LVEF has further decreased from 20-25% to 15-20%.  Coronary Angioplasty 12/31/20 Impella support high risk intervention  Unsuccessful attempt at proximal LAD CTO intervention in spite of use of support catheters and multiple wires. 129 ml contrast used. No immediate complications.   Continue guideline directed medical therapy for heart failure discuss regarding any future attempts of revascularization.  Lower Extremity Arterial Duplex 08/14/2021:  There is monophasic waveform pattern throughout the right lower extremity  arterial system suggesting significant proximal (Iliac artery) stenosis.  Right SFA stents appear to be widely patent.  No hemodynamically significant stenosis are identified in the left lower  extremity arterial system.  This exam reveals normal perfusion of the right lower extremity (ABI 1.00)  and mildly decreased perfusion of the left lower extremity, noted at the  post tibial artery  level (ABI 0.84).  There is monophasic waveform pattern  in bilateral lower extremity at the level of the ankles. Medial calcinosis  noted in bilateral small vessels.  ABI can be falsely elevated in patients with medial calcinosis. Compared  to 04/07/2019, no change in right ABI, left  ABI was 1.00.  EKG  08/20/2021: Sinus bradycardia at a rate of 56 bpm.  Left axis.  Inferior infarct old.  Anteroseptal infarct old.  Diffuse nonspecific T wave abnormality.  08/11/2020: Sinus tachycardia at rate of 120 bpm, atypical left bundle branch block.  No further analysis.   09/13/2020: Normal sinus rhythm at rate of 91 bpm, left atrial enlargement, inferior infarct old.  Anteroseptal infarct old.  Nonspecific T abnormality.  Compared to 08/11/2020, left bundle branch block not present.  04/19/2020: Normal sinus rhythm at rate of 61 bpm, inferior infarct old.  Anteroseptal infarct old.  Nonspecific T abnormality.  Borderline low voltage complexes.  No significant change from 11/08/2019.   Assessment     ICD-10-CM   1. Abrasion of left calf, initial encounter  S80.812A     2. Chronic systolic heart failure (HCC)  I50.22      No orders of the defined types were placed in this encounter.  Medications Discontinued During This Encounter  Medication Reason   doxycycline (VIBRA-TABS) 100 MG tablet    empagliflozin (JARDIANCE) 10 MG TABS tablet Patient has not taken in last 30 days       Recommendations:    Eduardo WILLHITE  is a 65 y.o. Caucasian male  with controlled type 2 diabetes, hypertension, hyperlipidemia, coronary artery disease by angiography on 09/08/2016 revealing severe triple-vessel CAD with no targets for CABG, he presented with new onset left bundle branch block and acute fulminant pulmonary edema needing intubation on 10/11/2020, emergent cardiac catheterization essentially revealing progression of severe native vessel disease with no significant option for revascularization.  Although LAD was felt to be amenable for potential revascularization, it was unchanged from prior cardiac catheterization and RCA was felt to be the culprit, in view of diffuse disease medical management was recommended.  Past medical history is also significant for morbid obesity, diabetes mellitus  with stage III AV chronic kidney disease, mild carotid atherosclerosis, right SFA occlusion S/P complex PV angiogram on 02/28/2019 with PTA to occluded right SFA with implantation of 3 overlapping 6.0 x 120 x2; 6.0 x 100 mm Eluvia DES for critical right leg ischemia now resolved. 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 He has mild disease in the left leg.  PAD has remained stable. Due to frequent PVCs, he was also started on amiodarone prophylactically.   Patient was again hospitalized 12/28/2020-01/02/2021 for acute on chronic systolic heart failure he diuresed well and underwent unsuccessful attempt to proximal LAD CTO intervention, was again recommended continued medical therapy.  Due to renal insufficiency while on spironolactone, this has been discontinued.   Patient presents for urgent visit with concerns of swelling on his left leg.  Patient recently had breakfast active replacement right foot for chronic wound.  Last week he woke up and noticed an area on his posterior left calf with an abrasion and surrounding swelling, patient's girlfriend raised concern that it may be a blood clot.  In the interim patient was seen by wound management who felt the abrasion and swelling were likely due to injury from the boot on his right leg while sleeping.  Since last week swelling has resolved and abrasion is healing  well according to patient.  On physical exam there is no evidence suggestive of DVT.  Agree with wound management's advice.  There is no clinical evidence of heart failure and patient is otherwise doing well.  Will keep previously scheduled appointment in April 2023.  Patient continues to prefer medical management of CAD and heart failure as opposed to considering CAD and/or palliative care consult.   Alethia Berthold, PA-C 10/20/2021, 11:46 AM Office: [phone]

## 2021-10-21 ENCOUNTER — Encounter (HOSPITAL_BASED_OUTPATIENT_CLINIC_OR_DEPARTMENT_OTHER): Payer: Medicare Other | Admitting: Internal Medicine

## 2021-10-21 DIAGNOSIS — E11621 Type 2 diabetes mellitus with foot ulcer: Secondary | ICD-10-CM | POA: Diagnosis not present

## 2021-10-21 NOTE — Progress Notes (Signed)
Eduardo [name], Eduardo [name] ([id number]) Visit Report for 10/21/2021 Arrival Information Details Patient Name: Date of Service: Eduardo [name] MES M. 10/21/2021 9:45 A M Medical Record Number: [id number] Patient Account Number: [id number] Date of Birth/Sex: Treating RN: 10/23/1956 (65 y.o. Eduardo [name] Primary Care Langford Carias: Eduardo [name] A Other Clinician: Referring Eduardo [name]: Treating Eduardo [name]/Extender: Eduardo [name], Eduardo [name] in Treatment: 5 Visit Information History Since Last Visit Added or deleted any medications: No Patient Arrived: Ambulatory Any new allergies or adverse reactions: No Arrival Time: 09:50 Had a fall or experienced change in No Transfer Assistance: None activities of daily living that may affect Patient Identification Verified: Yes risk of falls: Secondary Verification Process Completed: Yes Signs or symptoms of abuse/neglect since last visito No Patient Requires Transmission-Based Precautions: No Hospitalized since last visit: No Patient Has Alerts: Yes Implantable device outside of the clinic excluding No Patient Alerts: Patient on Blood Thinner cellular tissue based products placed in the center 08/14/21 ABI R=1.0 L=.84 since last visit: Has Dressing in Place as Prescribed: Yes Has Footwear/Offloading in Place as Prescribed: Yes Right: T Contact Cast otal Pain Present Now: No Electronic Signature(s) Signed: 10/21/2021 5:08:15 PM By: Eduardo [name] Entered By: Eduardo [name] on 10/21/2021 09:51:01 -------------------------------------------------------------------------------- Encounter Discharge Information Details Patient Name: Date of Service: Eduardo [name], Eduardo [name] MES M. 10/21/2021 9:45 A M Medical Record Number: [id number] Patient Account Number: [id number] Date of Birth/Sex: Treating RN: 04-21-1956 (65 y.o. Eduardo [name] Primary Care Trei Schoch: Eduardo [name] Other Clinician: Referring Eduardo [name]: Treating Eduardo [name]/Extender: Eduardo [name], Eduardo [name] in Treatment: 5 Encounter Discharge Information Items Post Procedure Vitals Discharge Condition: Stable Temperature (F): 97.8 Ambulatory Status: Ambulatory Pulse (bpm): 75 Discharge Destination: Home Respiratory Rate (breaths/min): 18 Transportation: Private Auto Blood Pressure (mmHg): 106/68 Schedule Follow-up Appointment: Yes Clinical Summary of Care: Provided on 10/21/2021 Form Type Recipient Paper Patient Patient Electronic Signature(s) Signed: 10/21/2021 5:08:15 PM By: Eduardo [name] Entered By: Eduardo [name] on 10/21/2021 10:49:36 -------------------------------------------------------------------------------- Lower Extremity Assessment Details Patient Name: Date of Service: Eduardo [name] MES M. 10/21/2021 9:45 A M Medical Record Number: [id number] Patient Account Number: [id number] Date of Birth/Sex: Treating RN: 1956/06/30 (65 y.o. Eduardo [name] Primary Care Sanjuanita Condrey: Eduardo [name] Other Clinician: Referring Karah Caruthers: Treating Eduardo [name]/Extender: Eduardo [name], Eduardo [name] in Treatment: 5 Edema Assessment Assessed: [Left: No] [Right: Yes] Edema: [Left: N] [Right: o] Calf Left: Right: Point of Measurement: 33 cm From Medial Instep 37 cm Ankle Left: Right: Point of Measurement: 9 cm From Medial Instep 23 cm Vascular Assessment Pulses: Dorsalis Pedis Palpable: [Right:Yes] Electronic Signature(s) Signed: 10/21/2021 5:08:15 PM By: Eduardo [name] Entered By: Eduardo [name] on 10/21/2021 09:58:27 -------------------------------------------------------------------------------- Multi Wound Chart Details Patient Name: Date of Service: Eduardo [name], Eduardo [name] MES M. 10/21/2021 9:45 A M Medical Record Number: [id number] Patient Account Number: [id number] Date of Birth/Sex: Treating RN: 1956-10-03 (65 y.o. Eduardo [name] Primary Care Eduardo [name]: Eduardo [name] Other Clinician: Referring Naamah Boggess: Treating Eduardo [name]/Extender: Eduardo [name], Eduardo [name] in  Treatment: 5 Vital Signs Height(in): 70 Capillary Blood Glucose(mg/dl): 132 Weight(lbs): 274 Pulse(bpm): 76 Body Mass Index(BMI): 30 Blood Pressure(mmHg): 106/68 Temperature(F): 97.8 Respiratory Rate(breaths/min): 18 Photos: [3:No Photos Right, Plantar Foot] [4:No Photos Right, Lateral Foot] [N/A:N/A N/A] Wound Location: [3:Gradually Appeared] [4:Gradually Appeared] [N/A:N/A] Wounding Event: [3:Diabetic Wound/Ulcer of the Lower] [4:Diabetic Wound/Ulcer of the Lower] [N/A:N/A] Primary Etiology: [3:Extremity Cataracts, Congestive Heart Failure,] [4:Extremity Cataracts, Congestive Heart Failure,] [N/A:N/A] Comorbid History: [3:Coronary Artery Disease, Hypertension, Myocardial Infarction, Peripheral Arterial Disease, Type II  Diabetes, Osteomyelitis, Neuropathy 12/11/2019] [4:Coronary Artery Disease, Hypertension, Myocardial Infarction, Peripheral Arterial  Disease, Type II Diabetes, Osteomyelitis, Neuropathy 10/06/2021] [N/A:N/A] Date Acquired: [3:5] [4:2] [N/A:N/A] [name] of Treatment: [3:Open] [4:Open] [N/A:N/A] Wound Status: [3:0.1x0.1x0.1] [4:0.2x0.2x0.1] [N/A:N/A] Measurements L x W x D (cm) [3:0.008] [4:0.031] [N/A:N/A] A (cm) : rea [3:0.001] [4:0.003] [N/A:N/A] Volume (cm) : [3:99.90%] [4:84.20%] [N/A:N/A] % Reduction in A rea: [3:99.90%] [4:92.30%] [N/A:N/A] % Reduction in Volume: [3:Grade 2] [4:Grade 2] [N/A:N/A] Classification: [3:Medium] [4:Medium] [N/A:N/A] Exudate A mount: [3:Serosanguineous] [4:Serosanguineous] [N/A:N/A] Exudate Type: [3:red, brown] [4:red, brown] [N/A:N/A] Exudate Color: [3:Distinct, outline attached] [4:Distinct, outline attached] [N/A:N/A] Wound Margin: [3:Large (67-100%)] [4:Large (67-100%)] [N/A:N/A] Granulation A mount: [3:Red, Pink] [4:Red] [N/A:N/A] Granulation Quality: [3:None Present (0%)] [4:None Present (0%)] [N/A:N/A] Necrotic A mount: [3:Fascia: No] [4:Fat Layer (Subcutaneous Tissue): Yes N/A] Exposed Structures: [3:Fat Layer (Subcutaneous  Tissue): No Tendon: No Muscle: No Joint: No Bone: No Large (67-100%)] [4:Fascia: No Tendon: No Muscle: No Joint: No Bone: No Medium (34-66%)] [N/A:N/A] Epithelialization: [3:N/A] [4:Debridement - Selective/Open Wound N/A] Debridement: Pre-procedure Verification/Time Out N/A [4:10:10] [N/A:N/A] Taken: [3:N/A] [4:Callus] [N/A:N/A] Tissue Debrided: [3:N/A] [4:Skin/Dermis] [N/A:N/A] Level: [3:N/A] [4:0.04] [N/A:N/A] Debridement A (sq cm): [3:rea N/A] [4:Blade, Forceps] [N/A:N/A] Instrument: [3:N/A] [4:Minimum] [N/A:N/A] Bleeding: [3:N/A] [4:Pressure] [N/A:N/A] Hemostasis A chieved: [3:N/A] [4:Procedure was tolerated well] [N/A:N/A] Debridement Treatment Response: [3:N/A] [4:0.2x0.2x0.1] [N/A:N/A] Post Debridement Measurements L x W x D (cm) [3:N/A] [4:0.003] [N/A:N/A] Post Debridement Volume: (cm) [3:N/A] [4:Debridement] [N/A:N/A] Procedures Performed: [4:Soft Cast] Treatment Notes Electronic Signature(s) Signed: 10/21/2021 4:30:32 PM By: Linton Ham MD Signed: 10/21/2021 4:51:20 PM By: Levan Hurst RN, BSN Entered By: Linton Ham on 10/21/2021 10:22:13 -------------------------------------------------------------------------------- Multi-Disciplinary Care Plan Details Patient Name: Date of Service: Eduardo [name], Eduardo [name] MES M. 10/21/2021 9:45 A M Medical Record Number: [id number] Patient Account Number: [id number] Date of Birth/Sex: Treating RN: 1956-01-24 (65 y.o. Eduardo [name] Primary Care Saydee Zolman: Eduardo [name] Other Clinician: Referring Garnet Chatmon: Treating Journey Ratterman/Extender: Eduardo [name], Eduardo [name] in Treatment: 5 Active Inactive Nutrition Nursing Diagnoses: Impaired glucose control: actual or potential Goals: Patient/caregiver verbalizes understanding of need to maintain therapeutic glucose control per primary care physician Date Initiated: 09/16/2021 Target Resolution Date: 10/25/2021 Goal Status: Active Interventions: Assess patient nutrition upon  admission and as needed per policy Provide education on elevated blood sugars and impact on wound healing Provide education on nutrition Treatment Activities: Obtain HgA1c : 09/16/2021 Notes: Wound/Skin Impairment Nursing Diagnoses: Impaired tissue integrity Goals: Patient/caregiver will verbalize understanding of skin care regimen Date Initiated: 09/16/2021 Target Resolution Date: 10/25/2021 Goal Status: Active Ulcer/skin breakdown will have a volume reduction of 30% by week 4 Date Initiated: 09/16/2021 Target Resolution Date: 10/25/2021 Goal Status: Active Interventions: Assess patient/caregiver ability to perform ulcer/skin care regimen upon admission and as needed Assess ulceration(s) every visit Provide education on ulcer and skin care Treatment Activities: Topical wound management initiated : 09/16/2021 Notes: Electronic Signature(s) Signed: 10/21/2021 5:08:15 PM By: Eduardo [name] Entered By: Eduardo [name] on 10/21/2021 10:07:07 -------------------------------------------------------------------------------- Pain Assessment Details Patient Name: Date of Service: Eduardo [name] MES M. 10/21/2021 9:45 A M Medical Record Number: [id number] Patient Account Number: [id number] Date of Birth/Sex: Treating RN: 09/25/1956 (65 y.o. Eduardo [name] Primary Care Dorr Perrot: Eduardo [name] Other Clinician: Referring Einer Meals: Treating Moise Friday/Extender: Eduardo [name], Eduardo [name] in Treatment: 5 Active Problems Location of Pain Severity and Description of Pain Patient Has Paino No Site Locations Pain Management and Medication Current Pain Management: Electronic Signature(s) Signed: 10/21/2021 5:08:15 PM By: Eduardo [name] Entered By: Eduardo [name] on  10/21/2021 09:55:31 -------------------------------------------------------------------------------- Patient/Caregiver Education Details Patient Name: Date of Service: Eduardo [name] MES M. 12/13/2022andnbsp9:45 [place]  Record Number: [id number] Patient Account Number: [id number] Date of Birth/Gender: Treating RN: 11/17/1955 (65 y.o. Eduardo [name] Primary Care Physician: Eduardo [name] Other Clinician: Referring Physician: Treating Physician/Extender: Darleene Cleaver in Treatment: 5 Education Assessment Education Provided To: Patient Education Topics Provided Elevated Blood Sugar/ Impact on Healing: Methods: Explain/Verbal, Printed Responses: State content correctly Wound/Skin Impairment: Methods: Explain/Verbal, Printed Responses: State content correctly Electronic Signature(s) Signed: 10/21/2021 5:08:15 PM By: Eduardo [name] Entered By: Eduardo [name] on 10/21/2021 10:06:28 -------------------------------------------------------------------------------- Wound Assessment Details Patient Name: Date of Service: Eduardo [name] MES M. 10/21/2021 9:45 A M Medical Record Number: [id number] Patient Account Number: [id number] Date of Birth/Sex: Treating RN: 1956/09/12 (65 y.o. Eduardo [name] Primary Care Quanika Solem: Eduardo [name] A Other Clinician: Referring Konstantina Nachreiner: Treating Daisia Slomski/Extender: Eduardo [name], Eduardo [name] in Treatment: 5 Wound Status Wound Number: 3 Primary Diabetic Wound/Ulcer of the Lower Extremity Etiology: Wound Location: Right, Plantar Foot Wound Open Wounding Event: Gradually Appeared Status: Date Acquired: 12/11/2019 Comorbid Cataracts, Congestive Heart Failure, Coronary Artery Disease, [name] Of Treatment: 5 History: Hypertension, Myocardial Infarction, Peripheral Arterial Disease, Clustered Wound: No Type II Diabetes, Osteomyelitis, Neuropathy Wound Measurements Length: (cm) 0.1 Width: (cm) 0.1 Depth: (cm) 0.1 Area: (cm) 0.008 Volume: (cm) 0.001 % Reduction in Area: 99.9% % Reduction in Volume: 99.9% Epithelialization: Large (67-100%) Tunneling: No Undermining: No Wound Description Classification: Grade 2 Wound Margin: Distinct,  outline attached Exudate Amount: Medium Exudate Type: Serosanguineous Exudate Color: red, brown Wound Bed Granulation Amount: Large (67-100%) Exposed Structure Granulation Quality: Red, Pink Fascia Exposed: No Necrotic Amount: None Present (0%) Fat Layer (Subcutaneous Tissue) Exposed: No Tendon Exposed: No Muscle Exposed: No Joint Exposed: No Bone Exposed: No Treatment Notes Wound #3 (Foot) Wound Laterality: Plantar, Right Cleanser Soap and Water Discharge Instruction: May shower and wash wound with dial antibacterial soap and water prior to dressing change. Peri-Wound Care Topical Primary Dressing Maxorb Extra Calcium Alginate 2x2 in Discharge Instruction: Apply calcium alginate to wound bed as instructed Secondary Dressing Woven Gauze Sponge, Non-Sterile 4x4 in Discharge Instruction: Apply over primary dressing as directed. ABD Pad, 5x9 Discharge Instruction: Apply over primary dressing as directed. Secured With 40M Medipore H Soft Cloth Surgical T ape, 4 x 10 (in/yd) Discharge Instruction: Secure with tape as directed. Compression Wrap Compression Stockings Add-Ons Electronic Signature(s) Signed: 10/21/2021 5:08:15 PM By: Eduardo [name] Entered By: Eduardo [name] on 10/21/2021 10:04:00 -------------------------------------------------------------------------------- Wound Assessment Details Patient Name: Date of Service: Eduardo [name] MES M. 10/21/2021 9:45 A M Medical Record Number: [id number] Patient Account Number: [id number] Date of Birth/Sex: Treating RN: December 08, 1955 (65 y.o. Eduardo [name] Primary Care Jakyri Brunkhorst: Eduardo [name] A Other Clinician: Referring Elmond Poehlman: Treating Leandrea Ackley/Extender: Eduardo [name], Eduardo [name] in Treatment: 5 Wound Status Wound Number: 4 Primary Diabetic Wound/Ulcer of the Lower Extremity Etiology: Wound Location: Right, Lateral Foot Wound Open Wounding Event: Gradually Appeared Status: Date Acquired: 10/06/2021 Comorbid  Cataracts, Congestive Heart Failure, Coronary Artery Disease, [name] Of Treatment: 2 History: Hypertension, Myocardial Infarction, Peripheral Arterial Disease, Clustered Wound: No Type II Diabetes, Osteomyelitis, Neuropathy Wound Measurements Length: (cm) 0.2 Width: (cm) 0.2 Depth: (cm) 0.1 Area: (cm) 0.031 Volume: (cm) 0.003 % Reduction in Area: 84.2% % Reduction in Volume: 92.3% Epithelialization: Medium (34-66%) Tunneling: No Undermining: No Wound Description Classification: Grade 2 Wound Margin: Distinct, outline attached Exudate Amount: Medium Exudate Type: Serosanguineous Exudate Color: red,  brown Foul Odor After Cleansing: No Slough/Fibrino No Wound Bed Granulation Amount: Large (67-100%) Exposed Structure Granulation Quality: Red Fascia Exposed: No Necrotic Amount: None Present (0%) Fat Layer (Subcutaneous Tissue) Exposed: Yes Tendon Exposed: No Muscle Exposed: No Joint Exposed: No Bone Exposed: No Treatment Notes Wound #4 (Foot) Wound Laterality: Right, Lateral Cleanser Soap and Water Discharge Instruction: May shower and wash wound with dial antibacterial soap and water prior to dressing change. Peri-Wound Care Topical Primary Dressing Maxorb Extra Calcium Alginate 2x2 in Discharge Instruction: Apply calcium alginate to wound bed as instructed Secondary Dressing Woven Gauze Sponge, Non-Sterile 4x4 in Discharge Instruction: Apply over primary dressing as directed. ABD Pad, 5x9 Discharge Instruction: Apply over primary dressing as directed. Secured With 48M Medipore H Soft Cloth Surgical T ape, 4 x 10 (in/yd) Discharge Instruction: Secure with tape as directed. Compression Wrap Compression Stockings Add-Ons Electronic Signature(s) Signed: 10/21/2021 5:08:15 PM By: Eduardo [name] Entered By: Eduardo [name] on 10/21/2021 10:13:54 -------------------------------------------------------------------------------- Vitals Details Patient Name: Date of  Service: Eduardo [name], Eduardo [name] MES M. 10/21/2021 9:45 A M Medical Record Number: [id number] Patient Account Number: [id number] Date of Birth/Sex: Treating RN: 1956/06/06 (65 y.o. Eduardo [name] Primary Care Souleymane Saiki: Eduardo [name] A Other Clinician: Referring Murrel Bertram: Treating Alitzel Cookson/Extender: Eduardo [name], Eduardo [name] in Treatment: 5 Vital Signs Time Taken: 09:54 Temperature (F): 97.8 Height (in): 70 Pulse (bpm): 75 Weight (lbs): 274 Respiratory Rate (breaths/min): 18 Body Mass Index (BMI): 39.3 Blood Pressure (mmHg): 106/68 Capillary Blood Glucose (mg/dl): 132 Reference Range: 80 - 120 mg / dl Electronic Signature(s) Signed: 10/21/2021 5:08:15 PM By: Eduardo [name] Entered By: Eduardo [name] on 10/21/2021 09:55:22

## 2021-10-21 NOTE — Progress Notes (Signed)
Eduardo [name] ([id number]) Visit Report for 10/21/2021 Debridement Details Patient Name: Date of Service: Eduardo [name] MES M. 10/21/2021 9:45 A M Medical Record Number: [id number] Patient Account Number: [id number] Date of Birth/Sex: Treating [name]: 06-Nov-1956 (65 y.o. Eduardo [name] Primary Care Provider: Vikki [name] Other Clinician: Referring Provider: Treating Provider/Extender: Eduardo [name], Eduardo [name] in Treatment: 5 Debridement Performed for Assessment: Wound #4 Right,Lateral Foot Performed By: Physician Eduardo [name]., MD Debridement Type: Debridement Severity of Tissue Pre Debridement: Fat layer exposed Level of Consciousness (Pre-procedure): Awake and Alert Pre-procedure Verification/Time Out Yes - 10:10 Taken: Start Time: 10:11 T Area Debrided (L x W): otal 0.2 (cm) x 0.2 (cm) = 0.04 (cm) Tissue and other material debrided: Non-Viable, Callus, Skin: Dermis Level: Skin/Dermis Debridement Description: Selective/Open Wound Instrument: Blade, Forceps Bleeding: Minimum Hemostasis Achieved: Pressure End Time: 10:14 Response to Treatment: Procedure was tolerated well Level of Consciousness (Post- Awake and Alert procedure): Post Debridement Measurements of Total Wound Length: (cm) 0.2 Width: (cm) 0.2 Depth: (cm) 0.1 Volume: (cm) 0.003 Character of Wound/Ulcer Post Debridement: Stable Severity of Tissue Post Debridement: Fat layer exposed Post Procedure Diagnosis Same as Pre-procedure Electronic Signature(s) Signed: 10/21/2021 4:30:32 PM By: Eduardo Ham MD Signed: 10/21/2021 4:51:20 PM By: Eduardo [name], BSN Entered By: Eduardo [name] on 10/21/2021 10:22:22 -------------------------------------------------------------------------------- HPI Details Patient Name: Date of Service: Eduardo [name], Eduardo [name] MES M. 10/21/2021 9:45 A M Medical Record Number: [id number] Patient Account Number: [id number] Date of Birth/Sex: Treating [name]: 05-13-1956 (65 y.o. 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 He developed a callused area with an open area over the right fifth metatarsal head that was noted by his girlfriend in April. He was seen by Dr. Einar [name] surrounding this time for known PAD. He had arteriogram on 02/28/2019 that showed a right SFA occlusion that was open successfully post stenting. He is due to have another angiogram next week to look at the left side. This is also by Dr. Einar [name]. The patient has applied various dressings to the right foot including alginate and more recently Silvadene cream. He has recently completed a course of Augmentin. He also has severe neuropathy. I have reviewed the x-rays done in Eduardo [name] office on [facility] health link. I agree there does not seem to be any plain x-ray suggestion of osteomyelitis in the right fifth met head About a month ago he also noted an open area on the left foot. This is not nearly as worrisome as the right side at this point. 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 History of peritoneal abscess, Most recent ABIs done by Dr. Einar [name] on 04/07/2019 showed monophasic waveforms on the left biphasic waveforms on the right. ABIs were 1.0 bilaterally 05/04/19; patient came in today with increasing pain in the right foot. Bone scraping admitted last week showed Enterococcus faecalis he was on Augmentin until sometime last week, amoxicillin is the treatment of choice for enterococcus nevertheless I'll restart this. His MRI is booked for 6/30. He  saw Dr. Kris [name]. He underwent angiography. Please loosely placed stents in the common femoral and right proximal SFA  were widely patent. The left side showed iliac artery and femoral arteries to have very mild disease. 2 vessel runoff in the left leg his left AT is occluded. 7/2; patient's MRI as feared was not very good. Cellulitis and myositis. 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 Also suspected septic arthritis at the joint. Bone scraping I did when he first came in showed enterococcus faecalis he has been on on Augmentin but he finished this. 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 Sharol [name]. He apparently tolerated this well and sees him in the afternoon. He also has the area on the lateral part of the left fifth met head. We have been using silver alginate to this. 7/31; we did not look at the right fifth ray amputation site is been seen by orthopedics this afternoon. He has a small area on the left lateral fifth met head. Undermining laterally. We have been using silver alginate. 8/14-Patient comes a 2 [name] with left lateral fifth toe met head wound which is looking better continue to use silver alginate the right fifth ray amputation is handled by Dr. Sharol [name] and he placed him on doxycycline this week 8/28; wound on the lateral left fifth metatarsal head has closed over. He still has an open wound on the right foot which is a surgical wound being managed by Dr. Sharol [name] READMISSION 09/16/2021 Eduardo [name] is now a 65 year old man with type 2 diabetes. He was in the clinic in 2020 with an area on the left fifth metatarsal head and also a surgical wound on the right foot. My notes state that the latter was followed by Dr. Sharol [name] and ultimately ended up with a right fifth toe amputation. More recently he has been followed by Eduardo [name] at [facility] for an area on the right lateral foot. This goes  back in the summertime. The patient tells me that he has had this open on and off for about a year. Will cover with callus and then reopen. X-ray apparently on 9/27 has been negative. He has been using a Pegasys shoe. He has undergone debridements by Dr. Jacqualyn Posey . Was started on collagen roughly 2 [name] ago. He had a course of doxycycline in October and was started yesterday on another course of doxycycline. The patient has a history of PAD followed by Dr. Einar [name] Past medical history includes congestive heart failure, coronary artery disease status post MI in December 2021, ischemic cardiomyopathy he has an LVAD. Type 2 diabetes with good control with an A1c of 6.5. According to the patient he has had stents placed by Dr. Einar [name] x3 in the right leg. Most recent ABIs on 08/14/2021 showed a ABI of 1 on the right and 0.84 on the left. As mentioned apparent stents in the right leg although I have not looked over these records 09/23/2021; patient with an area on his left lateral foot. He has had a previous right toe perhaps partial ray amputation. I used silver alginate last week he is using a healing sandal. Attempting to limit his activity. Culture I did last time of this showed Proteus. Not plated to the doxycycline he was on. It is possible the doxycycline would cover this but I am going to give him an additional 7 days of Cipro to replace the doxycycline. More reliable. I have reviewed his past vascular history. He follows with Dr. Einar [name]. He does have indeed a  stent in the right SFA. Last arterial studies I can see where from 01/08/2021 at which time he had an ABI in the right of 0.72 monophasic waveforms. He has had an angiogram as well which I pasted below. I think he is felt to have sufficient perfusion to heal this wound Peripheral arteriogram 02/28/2019: Pelvic aortogram with limited bifemoral arteriogram revealed patent distal abdominal aorta without aneurysm, patent, bilateral iliac and common  femoral vessels. Right SFA is occluded in the ostium and reconstitutes outside of the Hunter's canal. Below the right knee there is two-vessel runoff, AT is occluded. Left leg was not studied beyond left common femoral artery. Successful PTA and stenting of right SFA, prolonged procedure, difficult procedure with use of multiple balloons, guidewires and catheters. 100% stenosis reduced to 0% with implantation of 3 overlapping 6.0 x 120 x2; 6.0 x 100 mm Eluvia DES. 180 mL contrast utilized. Peripheral arteriogram 05/02/2019: Right common femoral artery and right proximal SFA previously placed stent widely patent. Right iliac artery shows mild disease. Left iliac artery and left femoral arteries show very mild disease. There is two-vessel runoff in the left leg, left AT is occluded. Intermediate stenosis noted in the left distal and proximal SFA, pressure pullback reveals no significant gradient. 11/29; patient comes in today with improvement in his original wound on the right lateral plantar foot. However he has a new wound just below it small and superficial. The patient states that the wound opened yesterday. We have been using silver alginate. He completed the ciprofloxacin I gave him last time 12/1 patient in for the obligatory first total cast change. He has 2 wounds on the right plantar foot. Per our intake nurse everything looks better 12/6; patient comes in today he is done nicely. Both of his wounds almost look 100% epithelialized the area on the right lateral foot may be vulnerable in the center part. The rest of the area on the plantar foot looks fully epithelialized. I went over this with him he had custom inserts in his shoe which I looked at these were sustained and tented in the area where this wound is. I have no doubt he walks on the lateral part of his foot accounting for these wounds and the wounds he has had on the right fifth toe which ultimately ended up in a right fifth toe  amputation 2 years ago [the last time he was in this clinic 12/13; I put him in a total contact cast last week as a precautionary measure all of his wounds had closed. He arrives today with some of the callus separated from the underlying tissue. Perhaps blistered at 1 point. I covered the wound area with calcium alginate Electronic Signature(s) Signed: 10/21/2021 4:30:32 PM By: Eduardo Ham MD Entered By: Eduardo [name] on 10/21/2021 10:23:16 -------------------------------------------------------------------------------- Soft Cast Details Patient Name: Date of Service: Eduardo [name], Eduardo [name] MES M. 10/21/2021 9:45 A M Medical Record Number: [id number] Patient Account Number: [id number] Date of Birth/Sex: Treating [name]: 28-Jun-1956 (65 y.o. Marcheta Grammes Primary Care Provider: Vikki [name] Other Clinician: Referring Provider: Treating Provider/Extender: Darleene Cleaver in Treatment: 5 Procedure Performed for: Wound #4 Right,Lateral Foot Performed By: Clinician Lorrin Jackson, [name] Post Procedure Diagnosis Same as Pre-procedure Electronic Signature(s) Signed: 10/21/2021 4:30:32 PM By: Eduardo Ham MD Signed: 10/21/2021 5:08:15 PM By: Lorrin Jackson Entered By: Lorrin Jackson on 10/21/2021 10:16:07 -------------------------------------------------------------------------------- Physical Exam Details Patient Name: Date of Service: Eduardo [name] MES M. 10/21/2021 9:45 A M Medical Record  Number: [id number] Patient Account Number: [id number] Date of Birth/Sex: Treating [name]: 02-15-1956 (65 y.o. Eduardo [name] Primary Care Provider: Vikki [name] Other Clinician: Referring Provider: Treating Provider/Extender: Eduardo [name], Eve A [name] in Treatment: 5 Constitutional Sitting or standing Blood Pressure is within target range for patient.. Pulse regular and within target range for patient.Marland Kitchen Respirations regular, non-labored and within target range.. Temperature is normal  and within the target range for the patient.Marland Kitchen Appears in no distress. Notes Wound exam; some of this surface callus around the wound had separated. I gently remove this with pickups and a #15 scalpel. There small open areas underneath this but most of it is epithelialized although again very vulnerable looking. No evidence of infection Electronic Signature(s) Signed: 10/21/2021 4:30:32 PM By: Eduardo Ham MD Entered By: Eduardo [name] on 10/21/2021 10:24:48 -------------------------------------------------------------------------------- Physician Orders Details Patient Name: Date of Service: Eduardo [name], Eduardo [name] MES M. 10/21/2021 9:45 A M Medical Record Number: [id number] Patient Account Number: [id number] Date of Birth/Sex: Treating [name]: 19-Jun-1956 (65 y.o. Marcheta Grammes Primary Care Provider: Rita Ohara A Other Clinician: Referring Provider: Treating Provider/Extender: Eduardo [name], Newt Lukes in Treatment: 5 Verbal / Phone Orders: No Diagnosis Coding ICD-10 Coding Code Description E11.621 Type 2 diabetes mellitus with foot ulcer L97.518 Non-pressure chronic ulcer of other part of right foot with other specified severity E11.51 Type 2 diabetes mellitus with diabetic peripheral angiopathy without gangrene Follow-up Appointments ppointment in: - on Thursday 10/23/21 Return A Bathing/ Shower/ Hygiene May shower and wash wound with soap and water. Off-Loading Other: - Soft Cast to right foot Additional Orders / Instructions Follow Nutritious Diet - -High Protein Diet -Keep monitoring blood sugar Wound Treatment Wound #3 - Foot Wound Laterality: Plantar, Right Cleanser: Soap and Water 1 x Per Week/30 Days Discharge Instructions: May shower and wash wound with dial antibacterial soap and water prior to dressing change. Prim Dressing: Maxorb Extra Calcium Alginate 2x2 in 1 x Per Week/30 Days ary Discharge Instructions: Apply calcium alginate to wound bed as  instructed Secondary Dressing: Woven Gauze Sponge, Non-Sterile 4x4 in 1 x Per Week/30 Days Discharge Instructions: Apply over primary dressing as directed. Secondary Dressing: ABD Pad, 5x9 1 x Per Week/30 Days Discharge Instructions: Apply over primary dressing as directed. Secured With: 636M Medipore H Soft Cloth Surgical T ape, 4 x 10 (in/yd) 1 x Per Week/30 Days Discharge Instructions: Secure with tape as directed. Wound #4 - Foot Wound Laterality: Right, Lateral Cleanser: Soap and Water 1 x Per Week/30 Days Discharge Instructions: May shower and wash wound with dial antibacterial soap and water prior to dressing change. Prim Dressing: Maxorb Extra Calcium Alginate 2x2 in 1 x Per Week/30 Days ary Discharge Instructions: Apply calcium alginate to wound bed as instructed Secondary Dressing: Woven Gauze Sponge, Non-Sterile 4x4 in 1 x Per Week/30 Days Discharge Instructions: Apply over primary dressing as directed. Secondary Dressing: ABD Pad, 5x9 1 x Per Week/30 Days Discharge Instructions: Apply over primary dressing as directed. Secured With: 636M Medipore H Soft Cloth Surgical T ape, 4 x 10 (in/yd) 1 x Per Week/30 Days Discharge Instructions: Secure with tape as directed. Electronic Signature(s) Signed: 10/21/2021 4:30:32 PM By: Eduardo Ham MD Signed: 10/21/2021 5:08:15 PM By: Lorrin Jackson Entered By: Lorrin Jackson on 10/21/2021 10:18:15 -------------------------------------------------------------------------------- Problem List Details Patient Name: Date of Service: Eduardo [name], Eduardo [name] MES M. 10/21/2021 9:45 A M Medical Record Number: [id number] Patient Account Number: [id number] Date of Birth/Sex: Treating [name]: July 13, 1956 (65 y.o. Marcheta Grammes Primary  Care Provider: Other Clinician: Vikki [name] Referring Provider: Treating Provider/Extender: Eduardo [name], Newt Lukes in Treatment: 5 Active Problems ICD-10 Encounter Code Description Active Date  MDM Diagnosis E11.621 Type 2 diabetes mellitus with foot ulcer 09/16/2021 No Yes L97.518 Non-pressure chronic ulcer of other part of right foot with other specified 09/16/2021 No Yes severity E11.51 Type 2 diabetes mellitus with diabetic peripheral angiopathy without gangrene 09/16/2021 No Yes Inactive Problems Resolved Problems Electronic Signature(s) Signed: 10/21/2021 4:30:32 PM By: Eduardo Ham MD Entered By: Eduardo [name] on 10/21/2021 10:21:59 -------------------------------------------------------------------------------- Progress Note Details Patient Name: Date of Service: Eduardo [name] MES M. 10/21/2021 9:45 A M Medical Record Number: [id number] Patient Account Number: [id number] Date of Birth/Sex: Treating [name]: 07/19/1956 (65 y.o. 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 He developed a callused area with an open area over the right fifth metatarsal head that was noted by his girlfriend in April. He was seen by Dr. Einar [name] surrounding this time for known PAD. He had arteriogram on 02/28/2019 that showed a right SFA occlusion that was open successfully post stenting. He is due to have another angiogram next week to look at the left side. This is also by Dr. Einar [name]. The patient has applied various dressings to the right foot including alginate and more recently Silvadene cream. He has recently completed a course of Augmentin. He also has severe neuropathy. I have reviewed the x-rays done in Eduardo [name] office on [facility] health link. I agree there does not seem to be any plain x-ray suggestion of osteomyelitis in the right fifth met head About a month ago he also noted an open area on the left foot. This is not nearly  as worrisome as the right side at this point. 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 History of peritoneal abscess, Most recent ABIs done by Dr. Einar [name] on 04/07/2019 showed monophasic waveforms on the left biphasic waveforms on the right. ABIs were 1.0 bilaterally 05/04/19; patient came in today with increasing pain in the right foot. Bone scraping admitted last week showed Enterococcus faecalis he was on Augmentin until sometime last week, amoxicillin is the treatment of choice for enterococcus nevertheless I'll restart this. His MRI is booked for 6/30. He saw Dr. Kris [name]. He underwent angiography. Please loosely placed stents in the common femoral and right proximal SFA were widely patent. The left side showed iliac artery and femoral arteries to have very mild disease. 2 vessel runoff in the left leg his left AT is occluded. 7/2; patient's MRI as feared was not very good. Cellulitis and myositis. 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 Also suspected septic arthritis at the joint. Bone scraping I did when he first came in showed enterococcus faecalis he has been on on Augmentin but he finished this. 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 Sharol [name]. He apparently tolerated this well and sees him in the afternoon. He also has the area on the lateral part of the left fifth met head. We have been using silver  alginate to this. 7/31; we did not look at the right fifth ray amputation site is been seen by orthopedics this afternoon. He has a small area on the left lateral fifth met head. Undermining laterally. We have been using silver alginate. 8/14-Patient comes a 2 [name] with left lateral fifth toe met  head wound which is looking better continue to use silver alginate the right fifth ray amputation is handled by Dr. Sharol [name] and he placed him on doxycycline this week 8/28; wound on the lateral left fifth metatarsal head has closed over. He still has an open wound on the right foot which is a surgical wound being managed by Dr. Sharol [name] READMISSION 09/16/2021 Mr. Rhett is now a 65 year old man with type 2 diabetes. He was in the clinic in 2020 with an area on the left fifth metatarsal head and also a surgical wound on the right foot. My notes state that the latter was followed by Dr. Sharol [name] and ultimately ended up with a right fifth toe amputation. More recently he has been followed by Eduardo [name] at [facility] for an area on the right lateral foot. This goes back in the summertime. The patient tells me that he has had this open on and off for about a year. Will cover with callus and then reopen. X-ray apparently on 9/27 has been negative. He has been using a Pegasys shoe. He has undergone debridements by Dr. Jacqualyn Posey . Was started on collagen roughly 2 [name] ago. He had a course of doxycycline in October and was started yesterday on another course of doxycycline. The patient has a history of PAD followed by Dr. Einar [name] Past medical history includes congestive heart failure, coronary artery disease status post MI in December 2021, ischemic cardiomyopathy he has an LVAD. Type 2 diabetes with good control with an A1c of 6.5. According to the patient he has had stents placed by Dr. Einar [name] x3 in the right leg. Most recent ABIs on 08/14/2021 showed a ABI of 1 on the right and 0.84 on the left. As mentioned apparent stents in the right leg although I have not looked over these records 09/23/2021; patient with an area on his left lateral foot. He has had a previous right toe perhaps partial ray amputation. I used silver alginate last week he is using a healing sandal. Attempting to limit his activity. Culture I  did last time of this showed Proteus. Not plated to the doxycycline he was on. It is possible the doxycycline would cover this but I am going to give him an additional 7 days of Cipro to replace the doxycycline. More reliable. I have reviewed his past vascular history. He follows with Dr. Einar [name]. He does have indeed a stent in the right SFA. Last arterial studies I can see where from 01/08/2021 at which time he had an ABI in the right of 0.72 monophasic waveforms. He has had an angiogram as well which I pasted below. I think he is felt to have sufficient perfusion to heal this wound Peripheral arteriogram 02/28/2019: Pelvic aortogram with limited bifemoral arteriogram revealed patent distal abdominal aorta without aneurysm, patent, bilateral iliac and common femoral vessels. Right SFA is occluded in the ostium and reconstitutes outside of the Hunter's canal. Below the right knee there is two-vessel runoff, AT is occluded. Left leg was not studied beyond left common femoral artery. Successful PTA and stenting of right SFA, prolonged procedure, difficult procedure with use of multiple balloons, guidewires and catheters. 100% stenosis reduced  to 0% with implantation of 3 overlapping 6.0 x 120 x2; 6.0 x 100 mm Eluvia DES. 180 mL contrast utilized. Peripheral arteriogram 05/02/2019: Right common femoral artery and right proximal SFA previously placed stent widely patent. Right iliac artery shows mild disease. Left iliac artery and left femoral arteries show very mild disease. There is two-vessel runoff in the left leg, left AT is occluded. Intermediate stenosis noted in the left distal and proximal SFA, pressure pullback reveals no significant gradient. 11/29; patient comes in today with improvement in his original wound on the right lateral plantar foot. However he has a new wound just below it small and superficial. The patient states that the wound opened yesterday. We have been using silver alginate. He  completed the ciprofloxacin I gave him last time 12/1 patient in for the obligatory first total cast change. He has 2 wounds on the right plantar foot. Per our intake nurse everything looks better 12/6; patient comes in today he is done nicely. Both of his wounds almost look 100% epithelialized the area on the right lateral foot may be vulnerable in the center part. The rest of the area on the plantar foot looks fully epithelialized. I went over this with him he had custom inserts in his shoe which I looked at these were sustained and tented in the area where this wound is. 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 He arrives today with some of the callus separated from the underlying tissue. Perhaps blistered at 1 point. I covered the wound area with calcium alginate Objective Constitutional Sitting or standing Blood Pressure is within target range for patient.. Pulse regular and within target range for patient.Marland Kitchen Respirations regular, non-labored and within target range.. Temperature is normal and within the target range for the patient.Marland Kitchen Appears in no distress. Vitals Time Taken: 9:54 AM, Height: 70 in, Weight: 274 lbs, BMI: 39.3, Temperature: 97.8 F, Pulse: 75 bpm, Respiratory Rate: 18 breaths/min, Blood Pressure: 106/68 mmHg, Capillary Blood Glucose: 132 mg/dl. General Notes: Wound exam; some of this surface callus around the wound had separated. I gently remove this with pickups and a #15 scalpel. There small open areas underneath this but most of it is epithelialized although again very vulnerable looking. No evidence of infection Integumentary (Hair, Skin) Wound #3 status is Open.  Original cause of wound was Gradually Appeared. The date acquired was: 12/11/2019. The wound has been in treatment 5 [name]. The wound is located on the [place]. The wound measures 0.1cm length x 0.1cm width x 0.1cm depth; 0.008cm^2 area and 0.001cm^3 volume. There is no tunneling or undermining noted. There is a medium amount of serosanguineous drainage noted. The wound margin is distinct with the outline attached to the wound base. There is large (67-100%) red, pink granulation within the wound bed. There is no necrotic tissue within the wound bed. Wound #4 status is Open. Original cause of wound was Gradually Appeared. The date acquired was: 10/06/2021. The wound has been in treatment 2 [name]. The wound is located on the Right,Lateral Foot. The wound measures 0.2cm length x 0.2cm width x 0.1cm depth; 0.031cm^2 area and 0.003cm^3 volume.  There is Fat Layer (Subcutaneous Tissue) exposed. There is no tunneling or undermining noted. There is a medium amount of serosanguineous drainage noted. The wound margin is distinct with the outline attached to the wound base. There is large (67-100%) red granulation within the wound bed. There is no necrotic tissue within the wound bed. 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 There was a Selective/Open Wound Skin/Dermis Debridement with a total area of 0.04 sq cm performed by Eduardo [name]., MD. With the following instrument(s): Blade, and Forceps to remove Non-Viable tissue/material. Material removed includes Callus and Skin: Dermis and. No specimens were  taken. A time out was conducted at 10:10, prior to the start of the procedure. A Minimum amount of bleeding was controlled with Pressure. The procedure was tolerated well. Post Debridement Measurements: 0.2cm length x 0.2cm width x 0.1cm depth; 0.003cm^3 volume. Character of Wound/Ulcer Post Debridement is stable. Severity of Tissue Post Debridement is: Fat layer exposed. Post procedure Diagnosis Wound #4: Same as Pre-Procedure Pre-procedure diagnosis of Wound #4 is a Diabetic Wound/Ulcer of the Lower Extremity located on the Right,Lateral Foot . An Soft Cast procedure was performed by Lorrin Jackson, [name]. Post procedure Diagnosis Wound #4: Same as Pre-Procedure Plan Follow-up Appointments: Return Appointment in: - on Thursday 10/23/21 Bathing/ Shower/ Hygiene: May shower and wash wound with soap and water. Off-Loading: Other: - Soft Cast to right foot Additional Orders / Instructions: Follow Nutritious Diet - -High Protein Diet -Keep monitoring blood sugar WOUND #3: - Foot Wound Laterality: Plantar, Right Cleanser: Soap and Water 1 x Per Week/30 Days Discharge Instructions: May shower and wash wound with dial antibacterial soap and water prior to dressing change. Prim Dressing: Maxorb Extra Calcium Alginate 2x2 in 1 x Per Week/30 Days ary Discharge Instructions: Apply calcium alginate to wound bed as instructed Secondary Dressing: Woven Gauze Sponge, Non-Sterile 4x4 in 1 x Per Week/30 Days Discharge Instructions: Apply over primary dressing as directed. Secondary Dressing: ABD Pad, 5x9 1 x Per Week/30 Days Discharge Instructions: Apply over primary dressing as directed. Secured With: 37M Medipore H Soft Cloth Surgical T ape, 4 x 10 (in/yd) 1 x Per Week/30 Days Discharge Instructions: Secure with tape as directed. WOUND #4: - Foot Wound Laterality: Right, Lateral Cleanser: Soap and Water 1 x Per Week/30 Days Discharge Instructions: May shower and wash wound with dial antibacterial soap  and water prior to dressing change. Prim Dressing: Maxorb Extra Calcium Alginate 2x2 in 1 x Per Week/30 Days ary Discharge Instructions: Apply calcium alginate to wound bed as instructed Secondary Dressing: Woven Gauze Sponge, Non-Sterile 4x4 in 1 x Per Week/30 Days Discharge Instructions: Apply over primary dressing as directed. Secondary Dressing: ABD Pad, 5x9 1 x Per Week/30 Days Discharge Instructions: Apply over primary dressing as directed. Secured With: 37M Medipore H Soft Cloth Surgical T ape, 4 x 10 (in/yd) 1 x Per Week/30 Days Discharge Instructions: Secure with tape as directed. 1. I use calcium alginate and a soft cast of the right foot. We will see him back in 2 days and see if this is epithelialized enough to put him in his shoe which he brought in in the bag today  Electronic Signature(s) Signed: 10/21/2021 4:30:32 PM By: Eduardo Ham MD Entered By: Eduardo [name] on 10/21/2021 10:25:23 -------------------------------------------------------------------------------- SuperBill Details Patient Name: Date of Service: Eduardo [name], Eduardo [name] MES M. 10/21/2021 Medical Record Number: [id number] Patient Account Number: [id number] Date of Birth/Sex: Treating [name]: 1956-07-28 (65 y.o. 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

## 2021-10-23 ENCOUNTER — Encounter (HOSPITAL_BASED_OUTPATIENT_CLINIC_OR_DEPARTMENT_OTHER): Payer: Medicare Other | Admitting: Internal Medicine

## 2021-10-23 ENCOUNTER — Other Ambulatory Visit: Payer: Self-pay

## 2021-10-23 DIAGNOSIS — E11621 Type 2 diabetes mellitus with foot ulcer: Secondary | ICD-10-CM | POA: Diagnosis not present

## 2021-10-23 NOTE — Progress Notes (Signed)
ANGELES, PAOLUCCI ([id number]) Visit Report for 10/23/2021 Arrival Information Details Patient Name: Date of Service: Eduardo [name] MES M. 10/23/2021 9:15 A M Medical Record Number: [id number] Patient Account Number: [id number] Date of Birth/Sex: Treating RN: 05-Jun-1956 (65 y.o. Lorette Ang, Meta.Reding Primary Care Jerusalen Mateja: Vikki Ports Other Clinician: Referring Talin Rozeboom: Treating Eberardo Demello/Extender: Rudean Haskell, Newt Lukes in Treatment: 5 Visit Information History Since Last Visit Added or deleted any medications: No Patient Arrived: Ambulatory Any new allergies or adverse reactions: No Arrival Time: 09:05 Had a fall or experienced change in No Accompanied By: self activities of daily living that may affect Transfer Assistance: None risk of falls: Patient Identification Verified: Yes Signs or symptoms of abuse/neglect since last visito No Secondary Verification Process Completed: Yes Hospitalized since last visit: No Patient Requires Transmission-Based Precautions: No Implantable device outside of the clinic excluding No Patient Has Alerts: Yes cellular tissue based products placed in the center Patient Alerts: Patient on Blood Thinner since last visit: 08/14/21 ABI R=1.0 L=.84 Has Dressing in Place as Prescribed: Yes Pain Present Now: No Electronic Signature(s) Signed: 10/23/2021 10:28:40 AM By: Sandre Kitty Entered By: Sandre Kitty on 10/23/2021 09:06:20 -------------------------------------------------------------------------------- Clinic Level of Care Assessment Details Patient Name: Date of Service: Eduardo [name] MES M. 10/23/2021 9:15 A M Medical Record Number: [id number] Patient Account Number: [id number] Date of Birth/Sex: Treating RN: [birth year] (65 y.o. Eduardo [name] Primary Care Dyasia Firestine: Vikki Ports Other Clinician: Referring Airrion Otting: Treating Marylu Dudenhoeffer/Extender: Rudean Haskell, Artist Pais Weeks in Treatment: 5 Clinic Level of Care Assessment  Items TOOL 4 Quantity Score X- 1 0 Use when only an EandM is performed on FOLLOW-UP visit ASSESSMENTS - Nursing Assessment / Reassessment X- 1 10 Reassessment of Co-morbidities (includes updates in patient status) X- 1 5 Reassessment of Adherence to Treatment Plan ASSESSMENTS - Wound and Skin A ssessment / Reassessment X - Simple Wound Assessment / Reassessment - one wound 1 5 []  - 0 Complex Wound Assessment / Reassessment - multiple wounds X- 1 10 Dermatologic / Skin Assessment (not related to wound area) ASSESSMENTS - Focused Assessment X- 1 5 Circumferential Edema Measurements - multi extremities X- 1 10 Nutritional Assessment / Counseling / Intervention []  - 0 Lower Extremity Assessment (monofilament, tuning fork, pulses) []  - 0 Peripheral Arterial Disease Assessment (using hand held doppler) ASSESSMENTS - Ostomy and/or Continence Assessment and Care []  - 0 Incontinence Assessment and Management []  - 0 Ostomy Care Assessment and Management (repouching, etc.) PROCESS - Coordination of Care X - Simple Patient / Family Education for ongoing care 1 15 []  - 0 Complex (extensive) Patient / Family Education for ongoing care X- 1 10 Staff obtains Programmer, systems, Records, T Results / Process Orders est []  - 0 Staff telephones HHA, Nursing Homes / Clarify orders / etc []  - 0 Routine Transfer to another Facility (non-emergent condition) []  - 0 Routine Hospital Admission (non-emergent condition) []  - 0 New Admissions / Biomedical engineer / Ordering NPWT Apligraf, etc. , []  - 0 Emergency Hospital Admission (emergent condition) X- 1 10 Simple Discharge Coordination []  - 0 Complex (extensive) Discharge Coordination PROCESS - Special Needs []  - 0 Pediatric / Minor Patient Management []  - 0 Isolation Patient Management []  - 0 Hearing / Language / Visual special needs []  - 0 Assessment of Community assistance (transportation, D/C planning, etc.) []  - 0 Additional  assistance / Altered mentation []  - 0 Support Surface(s) Assessment (bed, cushion, seat, etc.) INTERVENTIONS - Wound Cleansing / Measurement X - Simple  Wound Cleansing - one wound 1 5 []  - 0 Complex Wound Cleansing - multiple wounds X- 1 5 Wound Imaging (photographs - any number of wounds) []  - 0 Wound Tracing (instead of photographs) X- 1 5 Simple Wound Measurement - one wound []  - 0 Complex Wound Measurement - multiple wounds INTERVENTIONS - Wound Dressings []  - 0 Small Wound Dressing one or multiple wounds []  - 0 Medium Wound Dressing one or multiple wounds []  - 0 Large Wound Dressing one or multiple wounds []  - 0 Application of Medications - topical []  - 0 Application of Medications - injection INTERVENTIONS - Miscellaneous []  - 0 External ear exam []  - 0 Specimen Collection (cultures, biopsies, blood, body fluids, etc.) []  - 0 Specimen(s) / Culture(s) sent or taken to Lab for analysis []  - 0 Patient Transfer (multiple staff / Civil Service fast streamer / Similar devices) []  - 0 Simple Staple / Suture removal (25 or less) []  - 0 Complex Staple / Suture removal (26 or more) []  - 0 Hypo / Hyperglycemic Management (close monitor of Blood Glucose) []  - 0 Ankle / Brachial Index (ABI) - do not check if billed separately X- 1 5 Vital Signs Has the patient been seen at the hospital within the last three years: Yes Total Score: 100 Level Of Care: New/Established - Level 3 Electronic Signature(s) Signed: 10/23/2021 5:30:53 PM By: Deon Pilling RN, BSN Entered By: Deon Pilling on 10/23/2021 09:38:43 -------------------------------------------------------------------------------- Encounter Discharge Information Details Patient Name: Date of Service: [name], [name] 10/23/2021 9:15 A M Medical Record Number: [id number] Patient Account Number: [id number] Date of Birth/Sex: Treating RN: 1956/09/30 (65 y.o. Eduardo [name] Primary Care Modestine Scherzinger: Vikki Ports Other  Clinician: Referring Marisela Line: Treating Torell Minder/Extender: Darleene Cleaver in Treatment: 5 Encounter Discharge Information Items Discharge Condition: Stable Ambulatory Status: Ambulatory Discharge Destination: Home Transportation: Private Auto Accompanied By: self Schedule Follow-up Appointment: No Clinical Summary of Care: Notes Padded the closed area with foam and gauze to closed area. Electronic Signature(s) Signed: 10/23/2021 5:30:53 PM By: Deon Pilling RN, BSN Entered By: Deon Pilling on 10/23/2021 09:39:29 -------------------------------------------------------------------------------- Lower Extremity Assessment Details Patient Name: Date of Service: Eduardo [name] MES M. 10/23/2021 9:15 A M Medical Record Number: [id number] Patient Account Number: [id number] Date of Birth/Sex: Treating RN: [birth year] (65 y.o. Eduardo [name] Primary Care Edna Rede: Vikki Ports Other Clinician: Referring Zehava Turski: Treating Murdock Jellison/Extender: Rudean Haskell, Eve A Weeks in Treatment: 5 Edema Assessment Assessed: [Left: No] [Right: Yes] Edema: [Left: N] [Right: o] Calf Left: Right: Point of Measurement: 33 cm From Medial Instep 36 cm Ankle Left: Right: Point of Measurement: 9 cm From Medial Instep 22 cm Vascular Assessment Pulses: Dorsalis Pedis Palpable: [Right:Yes] Electronic Signature(s) Signed: 10/23/2021 5:30:53 PM By: Deon Pilling RN, BSN Entered By: Deon Pilling on 10/23/2021 09:26:20 -------------------------------------------------------------------------------- Multi Wound Chart Details Patient Name: Date of Service: Eduardo [name] MES M. 10/23/2021 9:15 A M Medical Record Number: [id number] Patient Account Number: [id number] Date of Birth/Sex: Treating RN: 13-Mar-1956 (65 y.o. Eduardo [name] Primary Care Sharley Keeler: Vikki Ports Other Clinician: Referring Rosco Harriott: Treating Virginie Josten/Extender: Rudean Haskell, Eve A Weeks in Treatment:  5 Vital Signs Height(in): 70 Capillary Blood Glucose(mg/dl): 132 Weight(lbs): 274 Pulse(bpm): 79 Body Mass Index(BMI): 35 Blood Pressure(mmHg): 124/65 Temperature(F): 97.9 Respiratory Rate(breaths/min): 18 Photos: [3:No Photos Right, Plantar Foot] [4:No Photos Right, Lateral Foot] [N/A:N/A N/A] Wound Location: [3:Gradually Appeared] [4:Gradually Appeared] [N/A:N/A] Wounding Event: [3:Diabetic Wound/Ulcer of the Lower] [4:Diabetic Wound/Ulcer of the Lower] [  N/A:N/A] Primary Etiology: [3:Extremity 12/11/2019] [4:Extremity 10/06/2021] [N/A:N/A] Date Acquired: [3:5] [4:2] [N/A:N/A] Weeks of Treatment: [3:Open] [4:Open] [N/A:N/A] Wound Status: [3:0x0x0] [4:0x0x0] [N/A:N/A] Measurements L x W x D (cm) [3:0] [4:0] [N/A:N/A] A (cm) : rea [3:0] [4:0] [N/A:N/A] Volume (cm) : [3:100.00%] [4:100.00%] [N/A:N/A] % Reduction in A rea: [3:100.00%] [4:100.00%] [N/A:N/A] % Reduction in Volume: [3:Grade 2] [4:Grade 2] [N/A:N/A] Classification: [3:Medium] [4:Medium] [N/A:N/A] Exudate A mount: [3:Serosanguineous] [4:Serosanguineous] [N/A:N/A] Exudate Type: [3:red, brown] [4:red, brown] [N/A:N/A] Treatment Notes Electronic Signature(s) Signed: 10/23/2021 4:39:12 PM By: Linton Ham MD Signed: 10/23/2021 5:30:53 PM By: Deon Pilling RN, BSN Entered By: Linton Ham on 10/23/2021 09:37:02 -------------------------------------------------------------------------------- Multi-Disciplinary Care Plan Details Patient Name: Date of Service: [name], [name] 10/23/2021 9:15 A M Medical Record Number: [id number] Patient Account Number: [id number] Date of Birth/Sex: Treating RN: [birth year] (65 y.o. Eduardo [name] Primary Care Dontavius Keim: Other Clinician: Vikki Ports Referring Tasheena Wambolt: Treating Aunica Dauphinee/Extender: Rudean Haskell, Newt Lukes in Treatment: 5 Active Inactive Electronic Signature(s) Signed: 10/23/2021 5:30:53 PM By: Deon Pilling RN, BSN Entered By: Deon Pilling on  10/23/2021 09:32:22 -------------------------------------------------------------------------------- Pain Assessment Details Patient Name: Date of Service: Eduardo [name] MES M. 10/23/2021 9:15 A M Medical Record Number: [id number] Patient Account Number: [id number] Date of Birth/Sex: Treating RN: [birth year] (65 y.o. Eduardo [name] Primary Care Verma Grothaus: Vikki Ports Other Clinician: Referring Renee Erb: Treating Dosha Broshears/Extender: Rudean Haskell, Eve A Weeks in Treatment: 5 Active Problems Location of Pain Severity and Description of Pain Patient Has Paino No Site Locations Pain Management and Medication Current Pain Management: Electronic Signature(s) Signed: 10/23/2021 10:28:40 AM By: Sandre Kitty Signed: 10/23/2021 5:30:53 PM By: Deon Pilling RN, BSN Entered By: Sandre Kitty on 10/23/2021 09:06:49 -------------------------------------------------------------------------------- Patient/Caregiver Education Details Patient Name: Date of Service: Eduardo [name] MES M. 12/15/2022andnbsp9:15 Kermit Record Number: [id number] Patient Account Number: [id number] Date of Birth/Gender: Treating RN: [birth year] (65 y.o. Eduardo [name] Primary Care Physician: Vikki Ports Other Clinician: Referring Physician: Treating Physician/Extender: Darleene Cleaver in Treatment: 5 Education Assessment Education Provided To: Patient Education Topics Provided Wound/Skin Impairment: Handouts: Skin Care Do's and Dont's Methods: Explain/Verbal Responses: Reinforcements needed Electronic Signature(s) Signed: 10/23/2021 5:30:53 PM By: Deon Pilling RN, BSN Entered By: Deon Pilling on 10/23/2021 09:35:05 -------------------------------------------------------------------------------- Wound Assessment Details Patient Name: Date of Service: Eduardo [name] MES M. 10/23/2021 9:15 A M Medical Record Number: [id number] Patient Account Number: [id number] Date of  Birth/Sex: Treating RN: 1956/05/09 (65 y.o. Eduardo [name] Primary Care Eryck Negron: Vikki Ports Other Clinician: Referring Rebeckah Masih: Treating Thao Vanover/Extender: Rudean Haskell, Eve A Weeks in Treatment: 5 Wound Status Wound Number: 3 Primary Etiology: Diabetic Wound/Ulcer of the Lower Extremity Wound Location: Right, Plantar Foot Wound Status: Open Wounding Event: Gradually Appeared Date Acquired: 12/11/2019 Weeks Of Treatment: 5 Clustered Wound: No Wound Measurements Length: (cm) Width: (cm) Depth: (cm) Area: (cm) Volume: (cm) 0 % Reduction in Area: 100% 0 % Reduction in Volume: 100% 0 0 0 Wound Description Classification: Grade 2 Exudate Amount: Medium Exudate Type: Serosanguineous Exudate Color: red, brown Electronic Signature(s) Signed: 10/23/2021 10:28:40 AM By: Sandre Kitty Signed: 10/23/2021 5:30:53 PM By: Deon Pilling RN, BSN Entered By: Sandre Kitty on 10/23/2021 09:13:56 -------------------------------------------------------------------------------- Wound Assessment Details Patient Name: Date of Service: Eduardo [name] MES M. 10/23/2021 9:15 A M Medical Record Number: [id number] Patient Account Number: [id number] Date of Birth/Sex: Treating RN: September 14, 1956 (64 y.o. Eduardo [name] Primary Care Dorenda Pfannenstiel: Vikki Ports Other Clinician: Referring Corine Solorio:  Treating Jarron Curley/Extender: Rudean Haskell, Eve A Weeks in Treatment: 5 Wound Status Wound Number: 4 Primary Etiology: Diabetic Wound/Ulcer of the Lower Extremity Wound Location: Right, Lateral Foot Wound Status: Open Wounding Event: Gradually Appeared Date Acquired: 10/06/2021 Weeks Of Treatment: 2 Clustered Wound: No Wound Measurements Length: (cm) 0 Width: (cm) 0 Depth: (cm) 0 Area: (cm) 0 Volume: (cm) 0 % Reduction in Area: 100% % Reduction in Volume: 100% Wound Description Classification: Grade 2 Exudate Amount: Medium Exudate Type: Serosanguineous Exudate  Color: red, brown Electronic Signature(s) Signed: 10/23/2021 10:28:40 AM By: Sandre Kitty Signed: 10/23/2021 5:30:53 PM By: Deon Pilling RN, BSN Entered By: Sandre Kitty on 10/23/2021 09:13:57 -------------------------------------------------------------------------------- Vitals Details Patient Name: Date of Service: [name], [name] 10/23/2021 9:15 A M Medical Record Number: [id number] Patient Account Number: [id number] Date of Birth/Sex: Treating RN: Mar 13, 1956 (65 y.o. Eduardo [name] Primary Care Lexie Koehl: Vikki Ports Other Clinician: Referring Nyeema Want: Treating Ladoris Lythgoe/Extender: Rudean Haskell, Eve A Weeks in Treatment: 5 Vital Signs Time Taken: 09:06 Temperature (F): 97.9 Height (in): 70 Pulse (bpm): 68 Weight (lbs): 274 Respiratory Rate (breaths/min): 18 Body Mass Index (BMI): 39.3 Blood Pressure (mmHg): 124/65 Capillary Blood Glucose (mg/dl): 132 Reference Range: 80 - 120 mg / dl Electronic Signature(s) Signed: 10/23/2021 10:28:40 AM By: Sandre Kitty Entered By: Sandre Kitty on 10/23/2021 09:06:42

## 2021-10-23 NOTE — Progress Notes (Signed)
Eduardo, [name] ([id number]) Visit Report for 10/23/2021 HPI Details Patient Name: Date of Service: Eduardo [name] MES M. 10/23/2021 9:15 A M Medical Record Number: [id number] Patient Account Number: [id number] Date of Birth/Sex: Treating RN: September 04, 1956 (65 y.o. 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 He developed a callused area with an open area over the right fifth metatarsal head that was noted by his girlfriend in April. He was seen by Dr. Einar Gip surrounding this time for known PAD. He had arteriogram on 02/28/2019 that showed a right SFA occlusion that was open successfully post stenting. He is due to have another angiogram next week to look at the left side. This is also by Dr. Einar Gip. The patient has applied various dressings to the right foot including alginate and more recently Silvadene cream. He has recently completed a course of Augmentin. He also has severe neuropathy. I have reviewed the x-rays done in Dr. Leigh Aurora office on [name]. I agree there does not seem to be any plain x-ray suggestion of osteomyelitis in the right fifth met head About a month ago he also noted an open area on the left foot. This is not nearly as worrisome as the right side at this point. 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 History of peritoneal abscess, Most recent ABIs done by Dr. Einar Gip on 04/07/2019 showed monophasic waveforms on the left biphasic waveforms on the right. ABIs were 1.0 bilaterally 05/04/19; patient came in today  with increasing pain in the right foot. Bone scraping admitted last week showed Enterococcus faecalis he was on Augmentin until sometime last week, amoxicillin is the treatment of choice for enterococcus nevertheless I'll restart this. His MRI is booked for 6/30. He saw Dr. Kris Hartmann. He underwent angiography. Please loosely placed stents in the common femoral and right proximal SFA were widely patent. The left side showed iliac artery and femoral arteries to have very mild disease. 2 vessel runoff in the left leg his left AT is occluded. 7/2; patient's MRI as feared was not very good. Cellulitis and myositis. 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 Also suspected septic arthritis at the joint. Bone scraping I did when he first came in showed enterococcus faecalis he has been on on Augmentin but he finished this. 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 Sharol Given. He apparently tolerated this well and sees him in the afternoon. He also has the area on the lateral part of the left fifth met head. We have been using silver alginate to this. 7/31; we did not look at the right fifth ray amputation site is been seen by orthopedics this afternoon. He has a small area on the left lateral fifth met head. Undermining laterally. We have been using silver alginate. 8/14-Patient comes a 2 weeks with left lateral fifth toe met head wound which is looking better continue to use silver alginate the right fifth ray amputation is handled by Dr. Sharol Given and he placed him on doxycycline this week 8/28; wound on the lateral left  fifth metatarsal head has closed over. He still has an open wound on the right foot which is a surgical wound being managed by Dr. Sharol Given READMISSION 09/16/2021 [name] is now a 65 year old man with type 2 diabetes. He was in the clinic in 2020 with an area on the left fifth metatarsal head and also a  surgical wound on the right foot. My notes state that the latter was followed by Dr. Sharol Given and ultimately ended up with a right fifth toe amputation. More recently he has been followed by Dr. Earleen Newport at [facility] for an area on the right lateral foot. This goes back in the summertime. The patient tells me that he has had this open on and off for about a year. Will cover with callus and then reopen. X-ray apparently on 9/27 has been negative. He has been using a Pegasys shoe. He has undergone debridements by Dr. Jacqualyn Posey . Was started on collagen roughly 2 weeks ago. He had a course of doxycycline in October and was started yesterday on another course of doxycycline. The patient has a history of PAD followed by Dr. Einar Gip Past medical history includes congestive heart failure, coronary artery disease status post MI in December 2021, ischemic cardiomyopathy he has an LVAD. Type 2 diabetes with good control with an A1c of 6.5. According to the patient he has had stents placed by Dr. Einar Gip x3 in the right leg. Most recent ABIs on 08/14/2021 showed a ABI of 1 on the right and 0.84 on the left. As mentioned apparent stents in the right leg although I have not looked over these records 09/23/2021; patient with an area on his left lateral foot. He has had a previous right toe perhaps partial ray amputation. I used silver alginate last week he is using a healing sandal. Attempting to limit his activity. Culture I did last time of this showed Proteus. Not plated to the doxycycline he was on. It is possible the doxycycline would cover this but I am going to give him an additional 7 days of Cipro to replace the doxycycline. More reliable. I have reviewed his past vascular history. He follows with Dr. Einar Gip. He does have indeed a stent in the right SFA. Last arterial studies I can see where from 01/08/2021 at which time he had an ABI in the right of 0.72 monophasic waveforms. He has had an angiogram as well  which I pasted below. I think he is felt to have sufficient perfusion to heal this wound Peripheral arteriogram 02/28/2019: Pelvic aortogram with limited bifemoral arteriogram revealed patent distal abdominal aorta without aneurysm, patent, bilateral iliac and common femoral vessels. Right SFA is occluded in the ostium and reconstitutes outside of the Hunter's canal. Below the right knee there is two-vessel runoff, AT is occluded. Left leg was not studied beyond left common femoral artery. Successful PTA and stenting of right SFA, prolonged procedure, difficult procedure with use of multiple balloons, guidewires and catheters. 100% stenosis reduced to 0% with implantation of 3 overlapping 6.0 x 120 x2; 6.0 x 100 mm Eluvia DES. 180 mL contrast utilized. Peripheral arteriogram 05/02/2019: Right common femoral artery and right proximal SFA previously placed stent widely patent. Right iliac artery shows mild disease. Left iliac artery and left femoral arteries show very mild disease. There is two-vessel runoff in the left leg, left AT is occluded. Intermediate stenosis noted in the left distal and proximal SFA, pressure pullback reveals no significant gradient. 11/29; patient comes in today with  improvement in his original wound on the right lateral plantar foot. However he has a new wound just below it small and superficial. The patient states that the wound opened yesterday. We have been using silver alginate. He completed the ciprofloxacin I gave him last time 12/1 patient in for the obligatory first total cast change. He has 2 wounds on the right plantar foot. Per our intake nurse everything looks better 12/6; patient comes in today he is done nicely. Both of his wounds almost look 100% epithelialized the area on the right lateral foot may be vulnerable in the [name] part. The rest of the area on the plantar foot looks fully epithelialized. I went over this with him he had custom inserts in his  shoe which I looked at these were sustained and tented in the area where this wound is. 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 He arrives today with some of the callus separated from the underlying tissue. Perhaps blistered at 1 point. I covered the wound area with calcium alginate 12/15; patient comes in for a follow-up the area is totally epithelialized. Still some surface eschar however nothing needed debridement and everything is healed Electronic Signature(s) Signed: 10/23/2021 4:39:12 PM By: Linton Ham MD Entered By: Linton Ham on 10/23/2021 09:37:56 -------------------------------------------------------------------------------- Physical Exam Details Patient Name: Date of Service: Eduardo [name] MES M. 10/23/2021 9:15 A M Medical Record Number: [id number] Patient Account Number: [id number] Date of Birth/Sex: Treating RN: [birth year] (65 y.o. Eduardo [name] Primary Care Provider: Vikki Ports Other Clinician: Referring Provider: Treating Provider/Extender: Rudean Haskell, Eve A Weeks in Treatment: 5 Constitutional Sitting or standing Blood Pressure is within target range for patient.. Pulse regular and within target range for patient.Marland Kitchen Respirations regular, non-labored and within target range.. Temperature is normal and within the target range for the patient.Marland Kitchen Appears in no distress. Notes Wound exam some of the surface callus is still present although everything looks healthy here. No debridement is required there is no evidence of infection. His peripheral pulses are palpable Electronic Signature(s) Signed: 10/23/2021 4:39:12 PM By: Linton Ham MD Entered  By: Linton Ham on 10/23/2021 09:38:48 -------------------------------------------------------------------------------- Physician Orders Details Patient Name: Date of Service: [name], [name] 10/23/2021 9:15 A M Medical Record Number: [id number] Patient Account Number: [id number] Date of Birth/Sex: Treating RN: 09/05/1956 (65 y.o. Eduardo [name] Primary Care Provider: Vikki Ports Other Clinician: Referring Provider: Treating Provider/Extender: Darleene Cleaver in Treatment: 5 Verbal / Phone Orders: No Diagnosis Coding Discharge From [facility] Discharge from [place] - Call if any future wound care needs. May wear your tennis shoe. Pad this closed area to keep protected- use callous pad with gauze. NO BAREFEET ever. Electronic Signature(s) Signed: 10/23/2021 4:39:12 PM By: Linton Ham MD Signed: 10/23/2021 5:30:53 PM By: Deon Pilling RN, BSN Entered By: Deon Pilling on 10/23/2021 09:32:04 -------------------------------------------------------------------------------- Problem List Details Patient Name: Date of Service: [name], [name] 10/23/2021 9:15 A M Medical Record Number: [id number] Patient Account Number: [id number] Date of Birth/Sex: Treating RN: 04-26-1956 (65 y.o. Eduardo [name] Primary Care Provider: Tomi Bamberger,  Eve A Other Clinician: Referring Provider: Treating Provider/Extender: Rudean Haskell, Eve A Weeks in Treatment: 5 Active Problems ICD-10 Encounter Code Description Active Date MDM Diagnosis E11.621 Type 2 diabetes mellitus with foot ulcer 09/16/2021 No Yes L97.518 Non-pressure chronic ulcer of other part of right foot with other specified 09/16/2021 No Yes severity E11.51 Type 2 diabetes mellitus with diabetic peripheral angiopathy without gangrene 09/16/2021 No Yes Inactive Problems Resolved Problems Electronic Signature(s) Signed: 10/23/2021 4:39:12 PM By: Linton Ham MD Entered By: Linton Ham  on 10/23/2021 09:36:38 -------------------------------------------------------------------------------- Progress Note Details Patient Name: Date of Service: Eduardo [name] MES M. 10/23/2021 9:15 A M Medical Record Number: [id number] Patient Account Number: [id number] Date of Birth/Sex: Treating RN: [birth year] (65 y.o. 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 He developed a callused area with an open area over the right fifth metatarsal head that was noted by his girlfriend in April. He was seen by Dr. Einar Gip surrounding this time for known PAD. He had arteriogram on 02/28/2019 that showed a right SFA occlusion that was open successfully post stenting. He is due to have another angiogram next week to look at the left side. This is also by Dr. Einar Gip. The patient has applied various dressings to the right foot including alginate and more recently Silvadene cream. He has recently completed a course of Augmentin. He also has severe neuropathy. I have reviewed the x-rays done in Dr. Leigh Aurora office on [name] health link. I agree there does not seem to be any plain x-ray suggestion of osteomyelitis in the right fifth met head About a month ago he also noted an open area on the left foot. This is not nearly as worrisome as the right side at this point. 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 History of peritoneal abscess, Most recent ABIs done by Dr. Einar Gip on 04/07/2019 showed monophasic waveforms on the left biphasic waveforms on the  right. ABIs were 1.0 bilaterally 05/04/19; patient came in today with increasing pain in the right foot. Bone scraping admitted last week showed Enterococcus faecalis he was on Augmentin until sometime last week, amoxicillin is the treatment of choice for enterococcus nevertheless I'll restart this. His MRI is booked for 6/30. He saw Dr. Kris Hartmann. He underwent angiography. Please loosely placed stents in the common femoral and right proximal SFA were widely patent. The left side showed iliac artery and femoral arteries to have very mild disease. 2 vessel runoff in the left leg his left AT is occluded. 7/2; patient's MRI as feared was not very good. Cellulitis and myositis. 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 Also suspected septic arthritis at the joint. Bone scraping I did when he first came in showed enterococcus faecalis he has been on on Augmentin but he finished this. 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 Sharol Given. He apparently tolerated this well and sees him in the afternoon. He also has the area on the lateral part of the left fifth met head. We have been using silver alginate to this.  7/31; we did not look at the right fifth ray amputation site is been seen by orthopedics this afternoon. He has a small area on the left lateral fifth met head. Undermining laterally. We have been using silver alginate. 8/14-Patient comes a 2 weeks with left lateral fifth toe met head wound which is looking better continue to use silver alginate the right fifth ray amputation is handled by Dr. Sharol Given and he placed him on doxycycline this week 8/28; wound on the lateral left fifth metatarsal head has closed over. He still has an open wound on the right foot which is a surgical wound being managed by Dr. Sharol Given READMISSION 09/16/2021 Mr. Mcmann is now a 65 year old man with type 2 diabetes. He was in the clinic in  2020 with an area on the left fifth metatarsal head and also a surgical wound on the right foot. My notes state that the latter was followed by Dr. Sharol Given and ultimately ended up with a right fifth toe amputation. More recently he has been followed by Dr. Earleen Newport at [facility] for an area on the right lateral foot. This goes back in the summertime. The patient tells me that he has had this open on and off for about a year. Will cover with callus and then reopen. X-ray apparently on 9/27 has been negative. He has been using a Pegasys shoe. He has undergone debridements by Dr. Jacqualyn Posey . Was started on collagen roughly 2 weeks ago. He had a course of doxycycline in October and was started yesterday on another course of doxycycline. The patient has a history of PAD followed by Dr. Einar Gip Past medical history includes congestive heart failure, coronary artery disease status post MI in December 2021, ischemic cardiomyopathy he has an LVAD. Type 2 diabetes with good control with an A1c of 6.5. According to the patient he has had stents placed by Dr. Einar Gip x3 in the right leg. Most recent ABIs on 08/14/2021 showed a ABI of 1 on the right and 0.84 on the left. As mentioned apparent stents in the right leg although I have not looked over these records 09/23/2021; patient with an area on his left lateral foot. He has had a previous right toe perhaps partial ray amputation. I used silver alginate last week he is using a healing sandal. Attempting to limit his activity. Culture I did last time of this showed Proteus. Not plated to the doxycycline he was on. It is possible the doxycycline would cover this but I am going to give him an additional 7 days of Cipro to replace the doxycycline. More reliable. I have reviewed his past vascular history. He follows with Dr. Einar Gip. He does have indeed a stent in the right SFA. Last arterial studies I can see where from 01/08/2021 at which time he had an ABI in the right of  0.72 monophasic waveforms. He has had an angiogram as well which I pasted below. I think he is felt to have sufficient perfusion to heal this wound Peripheral arteriogram 02/28/2019: Pelvic aortogram with limited bifemoral arteriogram revealed patent distal abdominal aorta without aneurysm, patent, bilateral iliac and common femoral vessels. Right SFA is occluded in the ostium and reconstitutes outside of the Hunter's canal. Below the right knee there is two-vessel runoff, AT is occluded. Left leg was not studied beyond left common femoral artery. Successful PTA and stenting of right SFA, prolonged procedure, difficult procedure with use of multiple balloons, guidewires and catheters. 100% stenosis reduced to 0% with  implantation of 3 overlapping 6.0 x 120 x2; 6.0 x 100 mm Eluvia DES. 180 mL contrast utilized. Peripheral arteriogram 05/02/2019: Right common femoral artery and right proximal SFA previously placed stent widely patent. Right iliac artery shows mild disease. Left iliac artery and left femoral arteries show very mild disease. There is two-vessel runoff in the left leg, left AT is occluded. Intermediate stenosis noted in the left distal and proximal SFA, pressure pullback reveals no significant gradient. 11/29; patient comes in today with improvement in his original wound on the right lateral plantar foot. However he has a new wound just below it small and superficial. The patient states that the wound opened yesterday. We have been using silver alginate. He completed the ciprofloxacin I gave him last time 12/1 patient in for the obligatory first total cast change. He has 2 wounds on the right plantar foot. Per our intake nurse everything looks better 12/6; patient comes in today he is done nicely. Both of his wounds almost look 100% epithelialized the area on the right lateral foot may be vulnerable in the [name] part. The rest of the area on the plantar foot looks fully  epithelialized. I went over this with him he had custom inserts in his shoe which I looked at these were sustained and tented in the area where this wound is. 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 He arrives today with some of the callus separated from the underlying tissue. Perhaps blistered at 1 point. I covered the wound area with calcium alginate 12/15; patient comes in for a follow-up the area is totally epithelialized. Still some surface eschar however nothing needed debridement and everything is healed Objective Constitutional Sitting or standing Blood Pressure is within target range for patient.. Pulse regular and within target range for patient.Marland Kitchen Respirations regular, non-labored and within target range.. Temperature is normal and within the target range for the patient.Marland Kitchen Appears in no distress. Vitals Time Taken: 9:06 AM, Height: 70 in, Weight: 274 lbs, BMI: 39.3, Temperature: 97.9 F, Pulse: 68 bpm, Respiratory Rate: 18 breaths/min, Blood Pressure: 124/65 mmHg, Capillary Blood Glucose: 132 mg/dl. General Notes: Wound exam some of the surface callus is still present although everything looks healthy here. No debridement is required there is no evidence of infection. His peripheral pulses are palpable Integumentary (Hair, Skin) Wound #3 status is Open. Original cause of wound was Gradually Appeared. The date acquired was: 12/11/2019. The wound has been in treatment 5 weeks. The wound is located on the [place]. The wound measures 0cm length x 0cm width x 0cm depth; 0cm^2 area and 0cm^3 volume. There is a medium amount of serosanguineous drainage noted. Wound #4 status is  Open. Original cause of wound was Gradually Appeared. The date acquired was: 10/06/2021. The wound has been in treatment 2 weeks. The wound is located on the Right,Lateral Foot. The wound measures 0cm length x 0cm width x 0cm depth; 0cm^2 area and 0cm^3 volume. There is a medium amount of serosanguineous drainage noted. Assessment Active Problems ICD-10 Type 2 diabetes mellitus with foot ulcer Non-pressure chronic ulcer of other part of right foot with other specified  severity Type 2 diabetes mellitus with diabetic peripheral angiopathy without gangrene Plan Discharge From [facility]: Discharge from [place] - Call if any future wound care needs. May wear your tennis shoe. Pad this closed area to keep protected- use callous pad with gauze. NO BAREFEET ever. 1. The patient can be discharged from the wound care [name] he is healed 2. He has PAD status post interventions I am not sure when his follow-up is and whether he is scheduled for noninvasive studies at some point 3. If this fails to hold this area he is likely to need custom foot wear. He will need a callus pad gauze he has a regular shoe that he is going to wear as a start Electronic Signature(s) Signed: 10/23/2021 4:39:12 PM By: Linton Ham MD Entered By: Linton Ham on 10/23/2021 09:40:20 -------------------------------------------------------------------------------- SuperBill Details Patient Name: Date of Service: [name], [name] 10/23/2021 Medical Record Number: [id number] Patient Account Number: [id number] Date of Birth/Sex: Treating RN: Jun 05, 1956 (65 y.o. Eduardo [name] Primary Care Provider: Vikki Ports Other Clinician: Referring Provider: Treating Provider/Extender: Rudean Haskell, Artist Pais Weeks in Treatment: 5 Diagnosis Coding ICD-10 Codes Code Description E11.621 Type 2 diabetes mellitus with foot ulcer L97.518 Non-pressure chronic ulcer of other part of right foot with other specified  severity E11.51 Type 2 diabetes mellitus with diabetic peripheral angiopathy without gangrene Facility Procedures CPT4 Code: [id number] Description: 99213 - WOUND CARE VISIT-LEV 3 EST PT Modifier: Quantity: 1 Physician Procedures : CPT4 Code Description Modifier [id number] - WC PHYS LEVEL 3 - EST PT ICD-10 Diagnosis Description E11.621 Type 2 diabetes mellitus with foot ulcer L97.518 Non-pressure chronic ulcer of other part of right foot with other specified severity E11.51  Type 2 diabetes mellitus with diabetic peripheral angiopathy without gangrene Quantity: 1 Electronic Signature(s) Signed: 10/23/2021 4:39:12 PM By: Linton Ham MD Entered By: Linton Ham on 10/23/2021 09:40:40

## 2021-12-05 LAB — MICROALBUMIN, URINE: Microalb, Ur: 18

## 2021-12-05 LAB — MICROALBUMIN / CREATININE URINE RATIO: Microalb Creat Ratio: 1.65

## 2021-12-05 LAB — VITAMIN D 25 HYDROXY (VIT D DEFICIENCY, FRACTURES): Vit D, 25-Hydroxy: 54.2

## 2021-12-18 ENCOUNTER — Encounter: Payer: Self-pay | Admitting: *Deleted

## 2021-12-19 DIAGNOSIS — E1165 Type 2 diabetes mellitus with hyperglycemia: Secondary | ICD-10-CM | POA: Diagnosis not present

## 2021-12-23 DIAGNOSIS — E1165 Type 2 diabetes mellitus with hyperglycemia: Secondary | ICD-10-CM | POA: Diagnosis not present

## 2021-12-23 DIAGNOSIS — I10 Essential (primary) hypertension: Secondary | ICD-10-CM | POA: Diagnosis not present

## 2021-12-23 DIAGNOSIS — I2510 Atherosclerotic heart disease of native coronary artery without angina pectoris: Secondary | ICD-10-CM | POA: Diagnosis not present

## 2021-12-23 DIAGNOSIS — Z89421 Acquired absence of other right toe(s): Secondary | ICD-10-CM | POA: Diagnosis not present

## 2021-12-23 DIAGNOSIS — L97509 Non-pressure chronic ulcer of other part of unspecified foot with unspecified severity: Secondary | ICD-10-CM | POA: Diagnosis not present

## 2021-12-23 DIAGNOSIS — N189 Chronic kidney disease, unspecified: Secondary | ICD-10-CM | POA: Diagnosis not present

## 2021-12-23 DIAGNOSIS — R809 Proteinuria, unspecified: Secondary | ICD-10-CM | POA: Diagnosis not present

## 2021-12-23 DIAGNOSIS — E7800 Pure hypercholesterolemia, unspecified: Secondary | ICD-10-CM | POA: Diagnosis not present

## 2021-12-23 DIAGNOSIS — I739 Peripheral vascular disease, unspecified: Secondary | ICD-10-CM | POA: Diagnosis not present

## 2021-12-23 LAB — HEMOGLOBIN A1C: Hemoglobin A1C: 6.4

## 2021-12-28 ENCOUNTER — Other Ambulatory Visit: Payer: Self-pay | Admitting: Cardiology

## 2022-01-11 ENCOUNTER — Other Ambulatory Visit: Payer: Self-pay | Admitting: Cardiology

## 2022-01-16 DIAGNOSIS — E1165 Type 2 diabetes mellitus with hyperglycemia: Secondary | ICD-10-CM | POA: Diagnosis not present

## 2022-02-02 ENCOUNTER — Other Ambulatory Visit: Payer: Self-pay

## 2022-02-02 ENCOUNTER — Encounter (INDEPENDENT_AMBULATORY_CARE_PROVIDER_SITE_OTHER): Payer: Medicare Other | Admitting: Ophthalmology

## 2022-02-02 DIAGNOSIS — E113391 Type 2 diabetes mellitus with moderate nonproliferative diabetic retinopathy without macular edema, right eye: Secondary | ICD-10-CM | POA: Diagnosis not present

## 2022-02-02 DIAGNOSIS — E113592 Type 2 diabetes mellitus with proliferative diabetic retinopathy without macular edema, left eye: Secondary | ICD-10-CM | POA: Diagnosis not present

## 2022-02-02 DIAGNOSIS — I10 Essential (primary) hypertension: Secondary | ICD-10-CM | POA: Diagnosis not present

## 2022-02-02 DIAGNOSIS — H35033 Hypertensive retinopathy, bilateral: Secondary | ICD-10-CM | POA: Diagnosis not present

## 2022-02-02 DIAGNOSIS — H43813 Vitreous degeneration, bilateral: Secondary | ICD-10-CM | POA: Diagnosis not present

## 2022-02-02 LAB — HM DIABETES EYE EXAM

## 2022-02-16 DIAGNOSIS — E1165 Type 2 diabetes mellitus with hyperglycemia: Secondary | ICD-10-CM | POA: Diagnosis not present

## 2022-02-17 ENCOUNTER — Encounter: Payer: Self-pay | Admitting: Student

## 2022-02-17 ENCOUNTER — Ambulatory Visit: Payer: Medicare Other | Admitting: Student

## 2022-02-17 VITALS — BP 106/63 | HR 74 | Temp 98.0°F | Resp 16 | Ht 69.0 in | Wt 274.0 lb

## 2022-02-17 DIAGNOSIS — I255 Ischemic cardiomyopathy: Secondary | ICD-10-CM

## 2022-02-17 DIAGNOSIS — I2510 Atherosclerotic heart disease of native coronary artery without angina pectoris: Secondary | ICD-10-CM

## 2022-02-17 DIAGNOSIS — I5022 Chronic systolic (congestive) heart failure: Secondary | ICD-10-CM | POA: Diagnosis not present

## 2022-02-17 NOTE — Progress Notes (Signed)
? ?Primary Physician/Referring:  Rita Ohara, MD ? ?Patient ID: Eduardo [name], male    DOB: [birth year], 66 y.o.   MRN: [id number] ? ?Chief Complaint  ?Patient presents with  ? Congestive Heart Failure  ? Follow-up  ?  6 month  ? ?HPI:   ? ?Eduardo [name]  is a 66 y.o. Caucasian male  with controlled type 2 diabetes, hypertension, hyperlipidemia, coronary artery disease by angiography on 09/08/2016 revealing severe triple-vessel CAD with no targets for CABG, he presented with new onset left bundle branch block and acute fulminant pulmonary edema needing intubation on 10/11/2020, emergent cardiac catheterization essentially revealing progression of severe native vessel disease with no significant option for revascularization.  Although LAD was felt to be amenable for potential revascularization, it was unchanged from prior cardiac catheterization and RCA was felt to be the culprit, in view of diffuse disease medical management was recommended. ? ?Past medical history is also significant for morbid obesity, diabetes mellitus with stage III AV chronic kidney disease, mild carotid atherosclerosis, right SFA occlusion S/P complex PV angiogram on 02/28/2019 with PTA to occluded right SFA with implantation of 3 overlapping 6.0 x 120 x2; 6.0 x 100 mm Eluvia DES for critical right leg ischemia now resolved. 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 He has mild disease in the left leg.  PAD has remained stable. Due to frequent PVCs, he was also started on amiodarone  prophylactically.  ? ?Patient was hospitalized 12/28/2020-01/02/2021 for acute on chronic systolic heart failure.  Patient was diuresed well, and given ischemic cardiomyopathy as etiology of heart failure attempted high risk PCI to LAD CTO, however it was unsuccessful. Conservative medical management was recommended, with consideration of palliative care involvement.  Patient was previously on spironolactone, however due to renal insufficiency this  has been discontinued. ? ?Patient presents for 27-monthfollow-up.  At last office visit patient was relatively stable from a cardiovascular standpoint, therefore no changes were made. Patient continues to feel well overall without specific complaints today. Denies chest pain, significant dyspnea, leg edema, orthopnea.  ? ?Past Medical History:  ?Diagnosis Date  ? CHF (congestive heart failure) ([facility]   ? Chronic kidney disease   ? stage 3  ? Colon polyp   ? Coronary artery disease   ? Diabetes mellitus 1987  ? under care of Dr. BChalmers Cater  On insulin since 96 (off and on)  ? Diabetic retinopathy   ? Dupuytren contracture   ? R hand, s/p injection (Dr. CLenon Curt  ? Essential hypertension, benign   ? Essential hypertension, benign 02/06/2019  ? Frequency of urination and polyuria   ? Hypertension   ? Myocardial infarction ([facility]   ? denies  ? Neuromuscular disorder ([facility]   ? Diabetic neuropathy  ? Osteomyelitis ([facility]   ? right foot  ? Other testicular hypofunction   ? Peripheral arterial disease ([facility] 10/28/2012  ? Peritoneal abscess ([facility] 6/08  ? and buttock.  ? Pneumonia   ? Polydipsia   ? Proteinuria   ? Pure hyperglyceridemia   ? Subacute osteomyelitis, right ankle and foot ([facility]   ? Wears glasses   ? ?Past Surgical History:  ?Procedure Laterality Date  ? ABDOMINAL AORTAGRAM N/A 04/18/2012  ? Procedure: ABDOMINAL AORTAGRAM;  Surgeon: CAngelia Mould MD;  [facility];  Service: Cardiovascular;  Laterality: N/A;  ? AMPUTATION Right 05/19/2019  ? Procedure: RIGHT FOOT 5TH RAY AMPUTATION;  Surgeon: DNewt Minion MD;  [place] [facility]  Service: Orthopedics;  Laterality: Right;  ? CARDIAC CATHETERIZATION N/A 09/08/2016  ? Procedure: Left Heart Cath and Coronary Angiography;  Surgeon: Adrian Prows, MD;  Location: [place] CV LAB;  Service: Cardiovascular;  Laterality: N/A;  ? CATARACT EXTRACTION, BILATERAL  09/2017, 10/2017  ? Dr. Herbert Deaner  ? COLONOSCOPY W/ BIOPSIES AND POLYPECTOMY    ? CORONARY/GRAFT ACUTE MI  REVASCULARIZATION N/A 10/11/2020  ? Procedure: Coronary/Graft Acute MI Revascularization;  Surgeon: Adrian Prows, MD;  Location: [place] CV LAB;  Service: Cardiovascular;  Laterality: N/A;  ? LEFT HEART CATH N/A 12/31/2020  ? Procedure: Left Heart Cath;  Surgeon: Adrian Prows, MD;  Location: [place] CV LAB;  Service: Cardiovascular;  Laterality: N/A;  ? LEFT HEART CATH AND CORONARY ANGIOGRAPHY N/A 10/11/2020  ? Procedure: LEFT HEART CATH AND CORONARY ANGIOGRAPHY;  Surgeon: Adrian Prows, MD;  Location: [place] CV LAB;  Service: Cardiovascular;  Laterality: N/A;  ? LOWER EXTREMITY ANGIOGRAM Bilateral 04/18/2012  ? Procedure: LOWER EXTREMITY ANGIOGRAM;  Surgeon: Angelia Mould, MD;  Location: [facility] CATH LAB;  Service: Cardiovascular;  Laterality: Bilateral;  bilat lower extrem angio  ? LOWER EXTREMITY ANGIOGRAPHY Bilateral 05/02/2019  ? Procedure: LOWER EXTREMITY ANGIOGRAPHY;  Surgeon: Adrian Prows, MD;  Location: [place] CV LAB;  Service: Cardiovascular;  Laterality: Bilateral;  ? LOWER EXTREMITY ANGIOGRAPHY Bilateral 02/28/2019  ? Procedure: LOWER EXTREMITY ANGIOGRAPHY;  Surgeon: Adrian Prows, MD;  Location: [place] CV LAB;  Service: Cardiovascular;  Laterality: Bilateral;  ? macular photocoagulation    ? (eye treatments for diabetic retinopathy)-Dr. Zigmund Daniel  ? PERIPHERAL VASCULAR INTERVENTION  02/28/2019  ? Procedure: PERIPHERAL VASCULAR INTERVENTION;  Surgeon: Adrian Prows, MD;  Location: [place] CV LAB;  Service: Cardiovascular;;  ? VENTRICULAR ASSIST DEVICE INSERTION N/A 12/31/2020  ? Procedure: VENTRICULAR ASSIST DEVICE INSERTION;  Surgeon: Adrian Prows, MD;  Location: [place] CV LAB;  Service: Cardiovascular;  Laterality: N/A;  ? ? ?Family History  ?Problem Relation Age of Onset  ? Diabetes Mother   ? Hearing loss Mother   ? Hypertension Mother   ? Hyperlipidemia Mother   ? Heart disease Mother   ? Varicose Veins Mother   ? Varicose Veins Father   ? Dementia Father   ? Hyperlipidemia Brother   ?  Diabetes Maternal Grandmother   ? ?Social History  ? ?Tobacco Use  ? Smoking status: Former  ?  Packs/day: 1.00  ?  Years: 30.00  ?  Pack years: 30.00  ?  Types: Cigarettes  ?  Quit date: 01/08/2012  ?  Years since quitting: 10.1  ? Smokeless tobacco: Never  ?Substance Use Topics  ? Alcohol use: Not Currently  ?  Comment: rare  ? ?Marital Status: Widowed ?ROS  ?Review of Systems  ?Constitutional: Negative for malaise/fatigue and weight gain.  ?Cardiovascular:  Positive for leg swelling (chronic, stable). Negative for chest pain, claudication, near-syncope, orthopnea, palpitations, paroxysmal nocturnal dyspnea and syncope.  ?Respiratory:  Negative for shortness of breath.   ?Neurological:  Negative for dizziness.  ?Objective  ?Blood pressure 106/63, pulse 74, temperature 98 ?F (36.7 ?C), resp. rate 16, height '5\' 9"'$  (1.753 m), weight 274 lb (124.3 kg), SpO2 96 %.  ? ?  02/17/2022  ?  2:11 PM 10/20/2021  ? 11:01 AM 08/20/2021  ?  9:04 AM  ?Vitals with BMI  ?Height '5\' 9"'$  5' 9.5" 5' 9.5"  ?Weight 274 lbs 282 lbs 276 lbs  ?BMI 40.44 41.06 40.19  ?Systolic 030 092 330  ?Diastolic 63 63 59  ?  Pulse 74 71 60  ?  ? Physical Exam ?Vitals reviewed.  ?Constitutional:   ?   Appearance: He is well-developed.  ?   Comments: Morbidly obese, in no acute distress  ?Neck:  ?   Thyroid: No thyromegaly.  ?   Comments: Short neck and difficult to evaluate JVP ?Cardiovascular:  ?   Pulses:     ?     Carotid pulses are 2+ on the right side and 2+ on the left side. ?     Popliteal pulses are 0 on the right side and 2+ on the left side.  ?     Dorsalis pedis pulses are 1+ on the right side and 2+ on the left side.  ?     Posterior tibial pulses are 1+ on the right side and 1+ on the left side.  ?   Heart sounds: No murmur heard. ?  No gallop.  ?Pulmonary:  ?   Effort: Pulmonary effort is normal.  ?   Breath sounds: No wheezing, rhonchi or rales.  ?Abdominal:  ?   Comments: Obese. Pannus present  ?Musculoskeletal:     ?   General: No tenderness.   ?   Right lower leg: Edema (trace) present.  ?   Left lower leg: Edema (trace) present.  ?Neurological:  ?   Mental Status: He is alert.  ? ?Laboratory examination:  ? ?Recent Labs  ?  05/05/21 ?1503 05/30/21

## 2022-02-18 ENCOUNTER — Ambulatory Visit: Payer: Medicare Other | Admitting: Student

## 2022-03-08 ENCOUNTER — Encounter (HOSPITAL_COMMUNITY): Payer: Self-pay

## 2022-03-08 ENCOUNTER — Other Ambulatory Visit: Payer: Self-pay

## 2022-03-08 ENCOUNTER — Other Ambulatory Visit: Payer: Self-pay | Admitting: Student

## 2022-03-08 ENCOUNTER — Inpatient Hospital Stay (HOSPITAL_COMMUNITY)
Admission: EM | Admit: 2022-03-08 | Discharge: 2022-03-21 | DRG: 853 | Disposition: A | Discharge status: Rehabilitation Facility | Payer: Medicare Other | Attending: Internal Medicine | Admitting: Internal Medicine

## 2022-03-08 ENCOUNTER — Emergency Department (HOSPITAL_COMMUNITY): Payer: Medicare Other

## 2022-03-08 DIAGNOSIS — I5043 Acute on chronic combined systolic (congestive) and diastolic (congestive) heart failure: Secondary | ICD-10-CM | POA: Diagnosis not present

## 2022-03-08 DIAGNOSIS — R7881 Bacteremia: Secondary | ICD-10-CM

## 2022-03-08 DIAGNOSIS — N189 Chronic kidney disease, unspecified: Secondary | ICD-10-CM | POA: Diagnosis not present

## 2022-03-08 DIAGNOSIS — J9601 Acute respiratory failure with hypoxia: Secondary | ICD-10-CM | POA: Diagnosis not present

## 2022-03-08 DIAGNOSIS — I080 Rheumatic disorders of both mitral and aortic valves: Secondary | ICD-10-CM | POA: Diagnosis not present

## 2022-03-08 DIAGNOSIS — Z8679 Personal history of other diseases of the circulatory system: Secondary | ICD-10-CM

## 2022-03-08 DIAGNOSIS — Z20822 Contact with and (suspected) exposure to covid-19: Secondary | ICD-10-CM | POA: Diagnosis present

## 2022-03-08 DIAGNOSIS — Z79899 Other long term (current) drug therapy: Secondary | ICD-10-CM

## 2022-03-08 DIAGNOSIS — M869 Osteomyelitis, unspecified: Secondary | ICD-10-CM | POA: Diagnosis present

## 2022-03-08 DIAGNOSIS — A408 Other streptococcal sepsis: Secondary | ICD-10-CM | POA: Diagnosis not present

## 2022-03-08 DIAGNOSIS — E1165 Type 2 diabetes mellitus with hyperglycemia: Secondary | ICD-10-CM | POA: Diagnosis present

## 2022-03-08 DIAGNOSIS — M86171 Other acute osteomyelitis, right ankle and foot: Secondary | ICD-10-CM | POA: Diagnosis not present

## 2022-03-08 DIAGNOSIS — Z794 Long term (current) use of insulin: Secondary | ICD-10-CM | POA: Diagnosis not present

## 2022-03-08 DIAGNOSIS — D62 Acute posthemorrhagic anemia: Secondary | ICD-10-CM | POA: Diagnosis not present

## 2022-03-08 DIAGNOSIS — I2510 Atherosclerotic heart disease of native coronary artery without angina pectoris: Secondary | ICD-10-CM | POA: Diagnosis present

## 2022-03-08 DIAGNOSIS — Z8249 Family history of ischemic heart disease and other diseases of the circulatory system: Secondary | ICD-10-CM

## 2022-03-08 DIAGNOSIS — L03115 Cellulitis of right lower limb: Secondary | ICD-10-CM | POA: Diagnosis not present

## 2022-03-08 DIAGNOSIS — B951 Streptococcus, group B, as the cause of diseases classified elsewhere: Secondary | ICD-10-CM

## 2022-03-08 DIAGNOSIS — Z91040 Latex allergy status: Secondary | ICD-10-CM

## 2022-03-08 DIAGNOSIS — R6521 Severe sepsis with septic shock: Secondary | ICD-10-CM | POA: Diagnosis present

## 2022-03-08 DIAGNOSIS — E1140 Type 2 diabetes mellitus with diabetic neuropathy, unspecified: Secondary | ICD-10-CM | POA: Diagnosis present

## 2022-03-08 DIAGNOSIS — N1832 Chronic kidney disease, stage 3b: Secondary | ICD-10-CM | POA: Diagnosis present

## 2022-03-08 DIAGNOSIS — Z7902 Long term (current) use of antithrombotics/antiplatelets: Secondary | ICD-10-CM

## 2022-03-08 DIAGNOSIS — E11628 Type 2 diabetes mellitus with other skin complications: Secondary | ICD-10-CM | POA: Diagnosis not present

## 2022-03-08 DIAGNOSIS — L02415 Cutaneous abscess of right lower limb: Secondary | ICD-10-CM | POA: Diagnosis not present

## 2022-03-08 DIAGNOSIS — Z4781 Encounter for orthopedic aftercare following surgical amputation: Secondary | ICD-10-CM | POA: Diagnosis not present

## 2022-03-08 DIAGNOSIS — I255 Ischemic cardiomyopathy: Secondary | ICD-10-CM | POA: Diagnosis present

## 2022-03-08 DIAGNOSIS — I70261 Atherosclerosis of native arteries of extremities with gangrene, right leg: Secondary | ICD-10-CM | POA: Diagnosis not present

## 2022-03-08 DIAGNOSIS — R6520 Severe sepsis without septic shock: Secondary | ICD-10-CM | POA: Diagnosis not present

## 2022-03-08 DIAGNOSIS — R6889 Other general symptoms and signs: Secondary | ICD-10-CM | POA: Diagnosis not present

## 2022-03-08 DIAGNOSIS — Z8349 Family history of other endocrine, nutritional and metabolic diseases: Secondary | ICD-10-CM

## 2022-03-08 DIAGNOSIS — Z66 Do not resuscitate: Secondary | ICD-10-CM | POA: Diagnosis not present

## 2022-03-08 DIAGNOSIS — I130 Hypertensive heart and chronic kidney disease with heart failure and stage 1 through stage 4 chronic kidney disease, or unspecified chronic kidney disease: Secondary | ICD-10-CM | POA: Diagnosis present

## 2022-03-08 DIAGNOSIS — R0602 Shortness of breath: Secondary | ICD-10-CM | POA: Diagnosis not present

## 2022-03-08 DIAGNOSIS — K5900 Constipation, unspecified: Secondary | ICD-10-CM | POA: Diagnosis not present

## 2022-03-08 DIAGNOSIS — L98499 Non-pressure chronic ulcer of skin of other sites with unspecified severity: Secondary | ICD-10-CM | POA: Diagnosis not present

## 2022-03-08 DIAGNOSIS — K30 Functional dyspepsia: Secondary | ICD-10-CM | POA: Diagnosis present

## 2022-03-08 DIAGNOSIS — R0689 Other abnormalities of breathing: Secondary | ICD-10-CM | POA: Diagnosis not present

## 2022-03-08 DIAGNOSIS — Z89431 Acquired absence of right foot: Secondary | ICD-10-CM

## 2022-03-08 DIAGNOSIS — E11621 Type 2 diabetes mellitus with foot ulcer: Secondary | ICD-10-CM | POA: Diagnosis not present

## 2022-03-08 DIAGNOSIS — E1152 Type 2 diabetes mellitus with diabetic peripheral angiopathy with gangrene: Secondary | ICD-10-CM | POA: Diagnosis present

## 2022-03-08 DIAGNOSIS — I499 Cardiac arrhythmia, unspecified: Secondary | ICD-10-CM | POA: Diagnosis not present

## 2022-03-08 DIAGNOSIS — J811 Chronic pulmonary edema: Secondary | ICD-10-CM | POA: Diagnosis not present

## 2022-03-08 DIAGNOSIS — Z833 Family history of diabetes mellitus: Secondary | ICD-10-CM

## 2022-03-08 DIAGNOSIS — I10 Essential (primary) hypertension: Secondary | ICD-10-CM | POA: Diagnosis present

## 2022-03-08 DIAGNOSIS — I110 Hypertensive heart disease with heart failure: Secondary | ICD-10-CM | POA: Diagnosis not present

## 2022-03-08 DIAGNOSIS — Z91013 Allergy to seafood: Secondary | ICD-10-CM

## 2022-03-08 DIAGNOSIS — Z87891 Personal history of nicotine dependence: Secondary | ICD-10-CM

## 2022-03-08 DIAGNOSIS — G4700 Insomnia, unspecified: Secondary | ICD-10-CM | POA: Diagnosis not present

## 2022-03-08 DIAGNOSIS — I509 Heart failure, unspecified: Secondary | ICD-10-CM | POA: Diagnosis not present

## 2022-03-08 DIAGNOSIS — E1122 Type 2 diabetes mellitus with diabetic chronic kidney disease: Secondary | ICD-10-CM | POA: Diagnosis present

## 2022-03-08 DIAGNOSIS — I25810 Atherosclerosis of coronary artery bypass graft(s) without angina pectoris: Secondary | ICD-10-CM | POA: Diagnosis not present

## 2022-03-08 DIAGNOSIS — N179 Acute kidney failure, unspecified: Secondary | ICD-10-CM | POA: Diagnosis present

## 2022-03-08 DIAGNOSIS — M7989 Other specified soft tissue disorders: Secondary | ICD-10-CM | POA: Diagnosis not present

## 2022-03-08 DIAGNOSIS — Z95820 Peripheral vascular angioplasty status with implants and grafts: Secondary | ICD-10-CM

## 2022-03-08 DIAGNOSIS — I252 Old myocardial infarction: Secondary | ICD-10-CM | POA: Diagnosis not present

## 2022-03-08 DIAGNOSIS — N1831 Chronic kidney disease, stage 3a: Secondary | ICD-10-CM | POA: Diagnosis present

## 2022-03-08 DIAGNOSIS — A419 Sepsis, unspecified organism: Principal | ICD-10-CM | POA: Diagnosis present

## 2022-03-08 DIAGNOSIS — Z888 Allergy status to other drugs, medicaments and biological substances status: Secondary | ICD-10-CM

## 2022-03-08 DIAGNOSIS — I340 Nonrheumatic mitral (valve) insufficiency: Secondary | ICD-10-CM | POA: Diagnosis not present

## 2022-03-08 DIAGNOSIS — E1169 Type 2 diabetes mellitus with other specified complication: Secondary | ICD-10-CM | POA: Diagnosis present

## 2022-03-08 DIAGNOSIS — B955 Unspecified streptococcus as the cause of diseases classified elsewhere: Secondary | ICD-10-CM | POA: Diagnosis not present

## 2022-03-08 DIAGNOSIS — F419 Anxiety disorder, unspecified: Secondary | ICD-10-CM | POA: Diagnosis not present

## 2022-03-08 DIAGNOSIS — Z743 Need for continuous supervision: Secondary | ICD-10-CM | POA: Diagnosis not present

## 2022-03-08 DIAGNOSIS — I5022 Chronic systolic (congestive) heart failure: Secondary | ICD-10-CM | POA: Diagnosis not present

## 2022-03-08 DIAGNOSIS — Z6841 Body Mass Index (BMI) 40.0 and over, adult: Secondary | ICD-10-CM | POA: Diagnosis not present

## 2022-03-08 DIAGNOSIS — I088 Other rheumatic multiple valve diseases: Secondary | ICD-10-CM | POA: Diagnosis not present

## 2022-03-08 DIAGNOSIS — L97919 Non-pressure chronic ulcer of unspecified part of right lower leg with unspecified severity: Secondary | ICD-10-CM | POA: Diagnosis not present

## 2022-03-08 DIAGNOSIS — Z818 Family history of other mental and behavioral disorders: Secondary | ICD-10-CM

## 2022-03-08 DIAGNOSIS — R000 Tachycardia, unspecified: Secondary | ICD-10-CM | POA: Diagnosis not present

## 2022-03-08 DIAGNOSIS — S91301A Unspecified open wound, right foot, initial encounter: Secondary | ICD-10-CM | POA: Diagnosis not present

## 2022-03-08 DIAGNOSIS — I739 Peripheral vascular disease, unspecified: Secondary | ICD-10-CM | POA: Diagnosis present

## 2022-03-08 DIAGNOSIS — M65871 Other synovitis and tenosynovitis, right ankle and foot: Secondary | ICD-10-CM | POA: Diagnosis not present

## 2022-03-08 DIAGNOSIS — M659 Synovitis and tenosynovitis, unspecified: Secondary | ICD-10-CM | POA: Diagnosis present

## 2022-03-08 DIAGNOSIS — E11319 Type 2 diabetes mellitus with unspecified diabetic retinopathy without macular edema: Secondary | ICD-10-CM | POA: Diagnosis present

## 2022-03-08 DIAGNOSIS — E781 Pure hyperglyceridemia: Secondary | ICD-10-CM | POA: Diagnosis present

## 2022-03-08 DIAGNOSIS — L97519 Non-pressure chronic ulcer of other part of right foot with unspecified severity: Secondary | ICD-10-CM | POA: Diagnosis not present

## 2022-03-08 DIAGNOSIS — I493 Ventricular premature depolarization: Secondary | ICD-10-CM | POA: Diagnosis present

## 2022-03-08 DIAGNOSIS — Z89511 Acquired absence of right leg below knee: Secondary | ICD-10-CM | POA: Diagnosis not present

## 2022-03-08 DIAGNOSIS — Z86010 Personal history of colonic polyps: Secondary | ICD-10-CM

## 2022-03-08 DIAGNOSIS — M25471 Effusion, right ankle: Secondary | ICD-10-CM | POA: Diagnosis not present

## 2022-03-08 DIAGNOSIS — G8918 Other acute postprocedural pain: Secondary | ICD-10-CM | POA: Diagnosis not present

## 2022-03-08 DIAGNOSIS — Z822 Family history of deafness and hearing loss: Secondary | ICD-10-CM

## 2022-03-08 DIAGNOSIS — S88111A Complete traumatic amputation at level between knee and ankle, right lower leg, initial encounter: Secondary | ICD-10-CM

## 2022-03-08 DIAGNOSIS — Z951 Presence of aortocoronary bypass graft: Secondary | ICD-10-CM

## 2022-03-08 DIAGNOSIS — Z7984 Long term (current) use of oral hypoglycemic drugs: Secondary | ICD-10-CM

## 2022-03-08 DIAGNOSIS — M79604 Pain in right leg: Secondary | ICD-10-CM | POA: Diagnosis not present

## 2022-03-08 DIAGNOSIS — Z7982 Long term (current) use of aspirin: Secondary | ICD-10-CM

## 2022-03-08 DIAGNOSIS — L97514 Non-pressure chronic ulcer of other part of right foot with necrosis of bone: Secondary | ICD-10-CM | POA: Diagnosis present

## 2022-03-08 DIAGNOSIS — E785 Hyperlipidemia, unspecified: Secondary | ICD-10-CM | POA: Diagnosis present

## 2022-03-08 DIAGNOSIS — I25118 Atherosclerotic heart disease of native coronary artery with other forms of angina pectoris: Secondary | ICD-10-CM | POA: Diagnosis present

## 2022-03-08 DIAGNOSIS — L0390 Cellulitis, unspecified: Secondary | ICD-10-CM | POA: Diagnosis not present

## 2022-03-08 LAB — CBC WITH DIFFERENTIAL/PLATELET
Abs Immature Granulocytes: 0.12 10*3/uL — ABNORMAL HIGH (ref 0.00–0.07)
Basophils Absolute: 0.1 10*3/uL (ref 0.0–0.1)
Basophils Relative: 0 %
Eosinophils Absolute: 0 10*3/uL (ref 0.0–0.5)
Eosinophils Relative: 0 %
HCT: 43.3 % (ref 39.0–52.0)
Hemoglobin: 14.4 g/dL (ref 13.0–17.0)
Immature Granulocytes: 1 %
Lymphocytes Relative: 3 %
Lymphs Abs: 0.4 10*3/uL — ABNORMAL LOW (ref 0.7–4.0)
MCH: 30.5 pg (ref 26.0–34.0)
MCHC: 33.3 g/dL (ref 30.0–36.0)
MCV: 91.7 fL (ref 80.0–100.0)
Monocytes Absolute: 0.4 10*3/uL (ref 0.1–1.0)
Monocytes Relative: 3 %
Neutro Abs: 13.6 10*3/uL — ABNORMAL HIGH (ref 1.7–7.7)
Neutrophils Relative %: 93 %
Platelets: 247 10*3/uL (ref 150–400)
RBC: 4.72 MIL/uL (ref 4.22–5.81)
RDW: 13.4 % (ref 11.5–15.5)
WBC: 14.5 10*3/uL — ABNORMAL HIGH (ref 4.0–10.5)
nRBC: 0 % (ref 0.0–0.2)

## 2022-03-08 LAB — COMPREHENSIVE METABOLIC PANEL
ALT: 24 U/L (ref 0–44)
AST: 28 U/L (ref 15–41)
Albumin: 2.4 g/dL — ABNORMAL LOW (ref 3.5–5.0)
Alkaline Phosphatase: 71 U/L (ref 38–126)
Anion gap: 14 (ref 5–15)
BUN: 38 mg/dL — ABNORMAL HIGH (ref 8–23)
CO2: 21 mmol/L — ABNORMAL LOW (ref 22–32)
Calcium: 8.3 mg/dL — ABNORMAL LOW (ref 8.9–10.3)
Chloride: 102 mmol/L (ref 98–111)
Creatinine, Ser: 2.01 mg/dL — ABNORMAL HIGH (ref 0.61–1.24)
GFR, Estimated: 36 mL/min — ABNORMAL LOW (ref >60)
Glucose, Bld: 197 mg/dL — ABNORMAL HIGH (ref 70–99)
Potassium: 4.8 mmol/L (ref 3.5–5.1)
Sodium: 137 mmol/L (ref 135–145)
Total Bilirubin: 1 mg/dL (ref 0.3–1.2)
Total Protein: 6.3 g/dL — ABNORMAL LOW (ref 6.5–8.1)

## 2022-03-08 LAB — HIV ANTIBODY (ROUTINE TESTING W REFLEX): HIV Screen 4th Generation wRfx: NONREACTIVE

## 2022-03-08 LAB — CBC
HCT: 41.9 % (ref 39.0–52.0)
Hemoglobin: 13.9 g/dL (ref 13.0–17.0)
MCH: 30.3 pg (ref 26.0–34.0)
MCHC: 33.2 g/dL (ref 30.0–36.0)
MCV: 91.5 fL (ref 80.0–100.0)
Platelets: 245 10*3/uL (ref 150–400)
RBC: 4.58 MIL/uL (ref 4.22–5.81)
RDW: 13.5 % (ref 11.5–15.5)
WBC: 21.5 10*3/uL — ABNORMAL HIGH (ref 4.0–10.5)
nRBC: 0 % (ref 0.0–0.2)

## 2022-03-08 LAB — RESP PANEL BY RT-PCR (FLU A&B, COVID) ARPGX2
Influenza A by PCR: NEGATIVE
Influenza B by PCR: NEGATIVE
SARS Coronavirus 2 by RT PCR: NEGATIVE

## 2022-03-08 LAB — LACTIC ACID, PLASMA
Lactic Acid, Venous: 2.1 mmol/L (ref 0.5–1.9)
Lactic Acid, Venous: 1.4 mmol/L (ref 0.5–1.9)
Lactic Acid, Venous: 1.3 mmol/L (ref 0.5–1.9)

## 2022-03-08 LAB — CREATININE, SERUM
Creatinine, Ser: 2.07 mg/dL — ABNORMAL HIGH (ref 0.61–1.24)
GFR, Estimated: 35 mL/min — ABNORMAL LOW (ref >60)

## 2022-03-08 LAB — GLUCOSE, CAPILLARY
Glucose-Capillary: 197 mg/dL — ABNORMAL HIGH (ref 70–99)
Glucose-Capillary: 221 mg/dL — ABNORMAL HIGH (ref 70–99)
Glucose-Capillary: 269 mg/dL — ABNORMAL HIGH (ref 70–99)

## 2022-03-08 LAB — APTT: aPTT: 30 seconds (ref 24–36)

## 2022-03-08 LAB — PROTIME-INR
INR: 1.2 (ref 0.8–1.2)
Prothrombin Time: 14.7 seconds (ref 11.4–15.2)

## 2022-03-08 MED ORDER — INSULIN ASPART 100 UNIT/ML IJ SOLN
0.0000 [IU] | Freq: Every day | INTRAMUSCULAR | Status: DC
  Administered 2022-03-08: 2 [IU] via SUBCUTANEOUS
  Administered 2022-03-09: 4 [IU] via SUBCUTANEOUS

## 2022-03-08 MED ORDER — SODIUM CHLORIDE 0.9 % IV SOLN: INTRAVENOUS | Status: DC

## 2022-03-08 MED ORDER — VANCOMYCIN HCL 1250 MG/250ML IV SOLN
1250.0000 mg | INTRAVENOUS | Status: DC
  Administered 2022-03-09: 1250 mg via INTRAVENOUS
  Filled 2022-03-08: qty 250

## 2022-03-08 MED ORDER — VANCOMYCIN HCL IN DEXTROSE 1-5 GM/200ML-% IV SOLN: 1000.0000 mg | Freq: Once | INTRAVENOUS | Status: DC

## 2022-03-08 MED ORDER — RISAQUAD PO CAPS
1.0000 | ORAL_CAPSULE | Freq: Every day | ORAL | Status: DC
  Administered 2022-03-08 – 2022-03-21 (×14): 1 via ORAL
  Filled 2022-03-08 (×14): qty 1

## 2022-03-08 MED ORDER — SODIUM CHLORIDE 0.9 % IV SOLN
2.0000 g | Freq: Two times a day (BID) | INTRAVENOUS | Status: DC
  Administered 2022-03-09 – 2022-03-12 (×7): 2 g via INTRAVENOUS
  Filled 2022-03-08 (×7): qty 12.5

## 2022-03-08 MED ORDER — LACTATED RINGERS IV BOLUS
1000.0000 mL | Freq: Once | INTRAVENOUS | Status: AC
Start: 2022-03-08 — End: 2022-03-08
  Administered 2022-03-08: 1000 mL via INTRAVENOUS

## 2022-03-08 MED ORDER — GEMFIBROZIL 600 MG PO TABS
600.0000 mg | ORAL_TABLET | Freq: Two times a day (BID) | ORAL | Status: DC
  Administered 2022-03-09 – 2022-03-21 (×23): 600 mg via ORAL
  Filled 2022-03-08 (×28): qty 1

## 2022-03-08 MED ORDER — EZETIMIBE 10 MG PO TABS
10.0000 mg | ORAL_TABLET | Freq: Every day | ORAL | Status: DC
  Administered 2022-03-08 – 2022-03-21 (×14): 10 mg via ORAL
  Filled 2022-03-08 (×14): qty 1

## 2022-03-08 MED ORDER — SODIUM CHLORIDE 0.9 % IV SOLN: 2.0000 g | Freq: Two times a day (BID) | INTRAVENOUS | Status: DC

## 2022-03-08 MED ORDER — METRONIDAZOLE 500 MG/100ML IV SOLN
500.0000 mg | Freq: Two times a day (BID) | INTRAVENOUS | Status: DC
  Administered 2022-03-08: 500 mg via INTRAVENOUS
  Filled 2022-03-08: qty 100

## 2022-03-08 MED ORDER — ADULT MULTIVITAMIN W/MINERALS CH
1.0000 | ORAL_TABLET | Freq: Every day | ORAL | Status: DC
  Administered 2022-03-08 – 2022-03-21 (×14): 1 via ORAL
  Filled 2022-03-08 (×14): qty 1

## 2022-03-08 MED ORDER — ACETAMINOPHEN 500 MG PO TABS
1000.0000 mg | ORAL_TABLET | Freq: Once | ORAL | Status: AC
Start: 2022-03-08 — End: 2022-03-08
  Administered 2022-03-08: 1000 mg via ORAL
  Filled 2022-03-08: qty 2

## 2022-03-08 MED ORDER — VANCOMYCIN HCL 10 G IV SOLR
2500.0000 mg | Freq: Once | INTRAVENOUS | Status: AC
  Administered 2022-03-08: 2500 mg via INTRAVENOUS
  Filled 2022-03-08: qty 2500

## 2022-03-08 MED ORDER — LACTATED RINGERS IV BOLUS (SEPSIS)
1000.0000 mL | Freq: Once | INTRAVENOUS | Status: AC
  Administered 2022-03-08: 1000 mL via INTRAVENOUS

## 2022-03-08 MED ORDER — ROSUVASTATIN CALCIUM 20 MG PO TABS
20.0000 mg | ORAL_TABLET | Freq: Every day | ORAL | Status: DC
  Administered 2022-03-08 – 2022-03-21 (×14): 20 mg via ORAL
  Filled 2022-03-08 (×14): qty 1

## 2022-03-08 MED ORDER — NOREPINEPHRINE 4 MG/250ML-% IV SOLN
2.0000 ug/min | INTRAVENOUS | Status: DC
  Administered 2022-03-08: 2 ug/min via INTRAVENOUS
  Filled 2022-03-08: qty 250

## 2022-03-08 MED ORDER — ORAL CARE MOUTH RINSE
15.0000 mL | Freq: Two times a day (BID) | OROMUCOSAL | Status: DC
  Administered 2022-03-08 – 2022-03-11 (×6): 15 mL via OROMUCOSAL

## 2022-03-08 MED ORDER — INSULIN ASPART 100 UNIT/ML IJ SOLN
0.0000 [IU] | Freq: Three times a day (TID) | INTRAMUSCULAR | Status: DC
  Administered 2022-03-09: 7 [IU] via SUBCUTANEOUS
  Administered 2022-03-09: 20 [IU] via SUBCUTANEOUS
  Administered 2022-03-09: 11 [IU] via SUBCUTANEOUS
  Administered 2022-03-10 (×2): 20 [IU] via SUBCUTANEOUS

## 2022-03-08 MED ORDER — HEPARIN SODIUM (PORCINE) 5000 UNIT/ML IJ SOLN
5000.0000 [IU] | Freq: Three times a day (TID) | INTRAMUSCULAR | Status: DC
  Administered 2022-03-08 – 2022-03-14 (×16): 5000 [IU] via SUBCUTANEOUS
  Filled 2022-03-08 (×16): qty 1

## 2022-03-08 MED ORDER — SODIUM CHLORIDE 0.9 % IV SOLN
250.0000 mL | INTRAVENOUS | Status: DC
  Administered 2022-03-08: 250 mL via INTRAVENOUS

## 2022-03-08 MED ORDER — SODIUM CHLORIDE 0.9 % IV SOLN: 250.0000 mL | INTRAVENOUS | Status: DC

## 2022-03-08 MED ORDER — LACTATED RINGERS IV BOLUS (SEPSIS)
1000.0000 mL | Freq: Once | INTRAVENOUS | Status: DC
Start: 2022-03-08 — End: 2022-03-08

## 2022-03-08 MED ORDER — VANCOMYCIN HCL 1250 MG/250ML IV SOLN: 1250.0000 mg | INTRAVENOUS | Status: DC

## 2022-03-08 MED ORDER — LACTATED RINGERS IV BOLUS (SEPSIS): 1000.0000 mL | Freq: Once | INTRAVENOUS | Status: DC

## 2022-03-08 MED ORDER — ONDANSETRON HCL 4 MG/2ML IJ SOLN: 4.0000 mg | Freq: Four times a day (QID) | INTRAMUSCULAR | Status: DC | PRN

## 2022-03-08 MED ORDER — POLYETHYLENE GLYCOL 3350 17 G PO PACK: 17.0000 g | PACK | Freq: Every day | ORAL | Status: DC | PRN

## 2022-03-08 MED ORDER — DOCUSATE SODIUM 100 MG PO CAPS
100.0000 mg | ORAL_CAPSULE | Freq: Two times a day (BID) | ORAL | Status: DC | PRN
  Administered 2022-03-11: 100 mg via ORAL
  Filled 2022-03-08: qty 1

## 2022-03-08 MED ORDER — ACETAMINOPHEN 325 MG PO TABS
ORAL_TABLET | ORAL | Status: AC
Start: 2022-03-08 — End: 2022-03-08
  Filled 2022-03-08: qty 2

## 2022-03-08 MED ORDER — NOREPINEPHRINE 4 MG/250ML-% IV SOLN
2.0000 ug/min | INTRAVENOUS | Status: DC
  Administered 2022-03-09: 4 ug/min via INTRAVENOUS
  Filled 2022-03-08 (×4): qty 250

## 2022-03-08 MED ORDER — SODIUM CHLORIDE 0.9 % IV SOLN
2.0000 g | Freq: Once | INTRAVENOUS | Status: AC
  Administered 2022-03-08: 2 g via INTRAVENOUS
  Filled 2022-03-08: qty 12.5

## 2022-03-08 MED ORDER — VITAMIN D 25 MCG (1000 UNIT) PO TABS
2000.0000 [IU] | ORAL_TABLET | Freq: Every day | ORAL | Status: DC
  Administered 2022-03-08 – 2022-03-21 (×14): 2000 [IU] via ORAL
  Filled 2022-03-08 (×14): qty 2

## 2022-03-08 MED ORDER — METRONIDAZOLE 500 MG/100ML IV SOLN
500.0000 mg | Freq: Once | INTRAVENOUS | Status: DC
  Filled 2022-03-08: qty 100

## 2022-03-08 NOTE — ED Notes (Signed)
PCCM at bedside. °

## 2022-03-08 NOTE — ED Triage Notes (Signed)
Fever chills sob tachy infected right foot.  Hx DM ?

## 2022-03-08 NOTE — Progress Notes (Signed)
LB PCCM ? ?R foot wound: ? ? ? ?

## 2022-03-08 NOTE — Plan of Care (Signed)
  Problem: Education: Goal: Knowledge of General Education information will improve Description: Including pain rating scale, medication(s)/side effects and non-pharmacologic comfort measures Outcome: Progressing   Problem: Pain Managment: Goal: General experience of comfort will improve Outcome: Progressing   Problem: Safety: Goal: Ability to remain free from injury will improve Outcome: Progressing   

## 2022-03-08 NOTE — ED Provider Notes (Signed)
Care assumed form previous provider at shift change. See note for full HPI. ? ?In summation, 66 year old hx of osteo with prior amputation, DM, EF <20 % here for evaluation of feeling unwell over the last week with fevers at home. ? ?On arrival patient febrile, tachycardic, hypotensive, Initially normotensive per EMS on arrival of EMS to house was hypoxic given hx was given Nitro with EMS.  ? ?Suspect septic shock ? ?CHF, No ICD ? ?OK with intubation, IVF and and BP support, NO Compressions ? ?Getting meds, IVF ? ?FU after IVF and reassess BP. Plan to admit medicine vs ICD. Will need Ortho consult ? ?Physical Exam  ?BP (!) 85/56   Pulse (!) 108   Temp 98.9 ?F (37.2 ?C) (Oral)   Resp (!) 26   Ht '5\' 9"'$  (1.753 m)   Wt 124.3 kg   SpO2 94%   BMI 40.46 kg/m?  ? ?Physical Exam ?Vitals and nursing note reviewed.  ?Constitutional:   ?   General: He is not in acute distress. ?   Appearance: He is well-developed. He is ill-appearing and toxic-appearing. He is not diaphoretic.  ?HENT:  ?   Head: Atraumatic.  ?   Mouth/Throat:  ?   Mouth: Mucous membranes are moist.  ?Eyes:  ?   Pupils: Pupils are equal, round, and reactive to light.  ?Cardiovascular:  ?   Rate and Rhythm: Normal rate and regular rhythm.  ?   Pulses:     ?     Radial pulses are 1+ on the right side and 1+ on the left side.  ?     Dorsalis pedis pulses are 1+ on the right side and 1+ on the left side.  ?   Heart sounds: Normal heart sounds.  ?Pulmonary:  ?   Effort: Pulmonary effort is normal. No respiratory distress.  ?   Breath sounds: Normal breath sounds.  ?Abdominal:  ?   General: Bowel sounds are normal. There is no distension.  ?   Palpations: Abdomen is soft.  ?Musculoskeletal:     ?   General: Swelling, tenderness and deformity present.  ?   Cervical back: Normal range of motion and neck supple.  ?   Comments: RLE 5th ray amputation. Ulcer to right lateral aspect foot with exposed bone. Erythema and warmth to RLE extending to proximal foot   ?Skin: ?   General: Skin is warm and dry.  ?   Findings: Erythema and lesion present.  ?Neurological:  ?   General: No focal deficit present.  ?   Mental Status: He is alert and oriented to person, place, and time.  ? ? ? ?Procedures  ?Marland KitchenCritical Care ?Performed by: Nettie Elm, PA-C ?Authorized by: Nettie Elm, PA-C  ? ?Critical care provider statement:  ?  Critical care time (minutes):  35 ?  Critical care was necessary to treat or prevent imminent or life-threatening deterioration of the following conditions:  Cardiac failure, sepsis and shock ?  Critical care was time spent personally by me on the following activities:  Development of treatment plan with patient or surrogate, discussions with consultants, evaluation of patient's response to treatment, examination of patient, ordering and review of laboratory studies, ordering and review of radiographic studies, ordering and performing treatments and interventions, pulse oximetry, re-evaluation of patient's condition and review of old charts ?Labs Reviewed  ?COMPREHENSIVE METABOLIC PANEL - Abnormal; Notable for the following components:  ?    Result Value  ? CO2 21 (*)   ?  Glucose, Bld 197 (*)   ? BUN 38 (*)   ? Creatinine, Ser 2.01 (*)   ? Calcium 8.3 (*)   ? Total Protein 6.3 (*)   ? Albumin 2.4 (*)   ? GFR, Estimated 36 (*)   ? All other components within normal limits  ?LACTIC ACID, PLASMA - Abnormal; Notable for the following components:  ? Lactic Acid, Venous 2.1 (*)   ? All other components within normal limits  ?CBC WITH DIFFERENTIAL/PLATELET - Abnormal; Notable for the following components:  ? WBC 14.5 (*)   ? Neutro Abs 13.6 (*)   ? Lymphs Abs 0.4 (*)   ? Abs Immature Granulocytes 0.12 (*)   ? All other components within normal limits  ?RESP PANEL BY RT-PCR (FLU A&B, COVID) [id number]  ?CULTURE, BLOOD (ROUTINE X 2)  ?CULTURE, BLOOD (ROUTINE X 2)  ?PROTIME-INR  ?APTT  ?LACTIC ACID, PLASMA  ?URINALYSIS, ROUTINE W REFLEX MICROSCOPIC  ? DG Chest  Portable 1 View ? ?Result Date: 03/08/2022 ?CLINICAL DATA:  Shortness of breath. EXAM: PORTABLE CHEST 1 VIEW COMPARISON:  12/28/2020 FINDINGS: Heart size and mediastinal contours are unremarkable. No signs of pleural effusion or edema. Mild chronic bronchitic changes are identified bilaterally. No superimposed airspace consolidation. The visualized osseous structures are unremarkable. IMPRESSION: No acute cardiopulmonary abnormalities. Electronically Signed   By: Kerby Moors M.D.   On: 03/08/2022 14:56  ? ?DG Foot Complete Right ? ?Result Date: 03/08/2022 ?CLINICAL DATA:  Foot wound, open wound lateral RIGHT foot EXAM: RIGHT FOOT COMPLETE - 3+ VIEW COMPARISON:  08/06/2021 FINDINGS: Osseous demineralization. Prior amputation of fifth ray with short fifth metatarsal base stump. Joint spaces preserved. Soft tissue ulcer identified at the lateral margin of the midfoot overlying the fifth metatarsal base with underlying bone destruction and minimal soft tissue gas consistent with osteomyelitis. Additional soft tissue swelling at the amputation bed from the fifth ray. No fracture or dislocation. IMPRESSION: Ulcer identified at lateral margin of mid RIGHT foot with associated bone destruction at the fifth metatarsal base consistent with osteomyelitis. Electronically Signed   By: Lavonia Dana M.D.   On: 03/08/2022 14:57    ?ED Course / MDM  ?Suspect septic shock from Osteo, Getting IVF, Abx. EF<20%. FU on IVF recheck BP to determine floor vs ICU.  Previous provider thought this was more septic shock versus cardiogenic however always a possibility given his known EF< 97 ? ?Previously Followed by Dr. Sharol Given with Ortho [phone] for right little toe amputation, Was also followed by Podiatry with [facility] ? ?Labs and imaging personally viewed and interpreted: ? ?DG left foot with osteo ?DG chest without acute findings ?CBC with leukocytosis at 14.5 ?CMP creatinine 2.01 up from baseline ?Lactic 2.1 ? ?Reassessed patient.  Discussed his Code status. Family agreeable with plan to admit. ? ?Reassess after 3 L IV fluids.  Continues to be hypotensive, systolic 71, maps less than 60.  Will be started on IV Levophed for pressure support.  Patient has normal mentation in room.  Do not feel he needs intubation at this time. ? ?CONSULT with Dr. Erlinda Hong with Ortho, given seen by Sharol Given previously, will see patient in consult. ? ?Patient reassessed.  On 6 of Levophed with improvement  MAP >65. Will consult critical care ? ?CONSULT with Dr. Lake Bells with PCCM agreeable to evaluate patient for admission ? ?Discussed admission with patient, family at side.  Agreeable. ? ?Clinical Course as of 03/08/22 1758  ?Sun Mar 08, 2022  ?1458 WBC(!): 14.5 ?  Patient with white count of 14,000, he is tachycardic, tachypneic, hypotensive, hypoxic.  Fits sepsis criteria, code sepsis has been initiated [AH]  ?1522 Presenting difficult decision making giving his hypotension.  He was normotensive prior to taking 2 nitroglycerin now hypotensive.  I discussed with the patient the fact that he could go into respiratory distress giving fluids.  He understands.  He states that he would want to be intubated if he went into respiratory distress but would not want compressions or resuscitation if his heart would stop. [AH]  ?1526 Comprehensive metabolic panel(!) [AH]  ?1526 Creatinine(!): 2.01 ?CmP with AKI [AH]  ?1527 Lactic Acid, Venous(!!): 2.1 [AH]  ?1527 DG Foot Complete Right ?I personally viewed the right foot x-ray which shows bony degradation of the metatarsal stump of the fifth digit consistent with osteomyelitis.  Agree with radiologic interpretation. [AH]  ?1528 DG Chest Portable 1 View ?I visualized a portable 1 view chest x-ray.  Appears similar to previous, no acute findings.  Agree with radiologic interpretation [AH]  ?1529 EKG shows left bundle branch block with sinus tachycardia [AH]  ?  ?Clinical Course User Index ?[AH] Margarita Mail, PA-C  ? ?Medical Decision  Making ?Amount and/or Complexity of Data Reviewed ?Independent Historian: spouse ?External Data Reviewed: labs, radiology, ECG and notes. ?Labs: ordered. Decision-making details documented in ED Course. ?Radiology: or

## 2022-03-08 NOTE — Progress Notes (Signed)
Pharmacy Antibiotic Note ? ?Eduardo [name] is a 66 y.o. male admitted on 03/08/2022 with sepsis likely secondary to foot wound.  Pharmacy has been consulted for Cefepime and vancomycin dosing. ? ?WBC elevated, SCr 2.01 (BL ~ 1.5-1.8), CrCL ~ 47 mL/min  ? ?Plan: ?-Cefepime 2 gm IV Q 12 hours ?-Vancomycin 2500 mg IV load followed by Vancomycin 1250 mg IV Q 24 hrs. Goal AUC 400-550. ?Expected AUC: 464 ?SCr used: 2.01 ?-Monitor CBC, renal fx, cultures and clinical progress ?-Vanc levels as indicated  ? ? ?Height: '5\' 9"'$  (175.3 cm) ?Weight: 124.3 kg (274 lb) ?IBW/kg (Calculated) : 70.7 ? ?Temp (24hrs), Avg:103.2 ?F (39.6 ?C), Min:103.2 ?F (39.6 ?C), Max:103.2 ?F (39.6 ?C) ? ?No results for input(s): WBC, CREATININE, LATICACIDVEN, VANCOTROUGH, VANCOPEAK, VANCORANDOM, GENTTROUGH, GENTPEAK, GENTRANDOM, TOBRATROUGH, TOBRAPEAK, TOBRARND, AMIKACINPEAK, AMIKACINTROU, AMIKACIN in the last 168 hours.  ?CrCl cannot be calculated (Patient's most recent lab result is older than the maximum 21 days allowed.).   ? ?Allergies  ?Allergen Reactions  ? Shellfish-Derived Products Nausea And Vomiting and Other (See Comments)  ?  Mussels cause severe nausea and vomiting ?  ? Latex Rash  ? Tape Rash and Other (See Comments)  ?  Caused issues with the skin  ? Testosterone Rash  ?  Other reaction(s): did not feel well ?Other reaction(s): did not feel well  ? ? ?Antimicrobials this admission: ?Cefepime 4/30 >>  ?Vancomycin 4/30 >>  ?Metronidazole 4/30 >>  ? ?Dose adjustments this admission: ? ? ?Microbiology results: ?4/30 BCx:  ? ? ?Thank you for allowing pharmacy to be a part of this patient?s care. ? ?Albertina Parr, PharmD., BCCCP ?Clinical Pharmacist ?Please refer to AMION for unit-specific pharmacist  ? ? ?

## 2022-03-08 NOTE — H&P (Signed)
? ?NAME:  Eduardo [name], MRN:  [id number], DOB:  03/09/1956, LOS: 0 ?ADMISSION DATE:  03/08/2022, CONSULTATION DATE:  4/30 ?REFERRING MD:  Francee Piccolo, CHIEF COMPLAINT:  Dyspea  ? ?History of Present Illness:  ?66 y/o male presented to the [facility] ER complaining of dyspnea, found to be in septic shock due to osteomyelitis of his right foot.   ? ?He has a complex past medical history and follows with [facility] for a complicated cardiac history which includes coronary artery disease status post triple-vessel CABG in 2017, hypertension and hyperlipidemia.  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 Sharol Given in the past.  He has a history of peripheral vascular disease and has a history of right superficial femoral artery stenting in 2020, he takes Plavix for that.  He also takes amiodarone for frequent PVCs in the setting of coronary disease and systolic heart failure. ? ?The patient tells me that over the last week he has been feeling poorly.  He has been having fevers off and on with associated chills and generalized weakness.  He threw up one time.  He denies nausea, vomiting or diarrhea.  No abdominal pain or rash.  However, his right foot wound has been swelling and draining more and has been foul-smelling for the last week.  He has not been on any antibiotics.  He said that today he started to feel more weak and was struggling to walk around in his house.  He thought that he may be having a heart attack given his extensive cardiac history so he took a nitroglycerin tablet.  At this point because he was so weak and unable to move his girlfriend called 911 and he was brought into the emergency department by EMS.  There he was recognized to have septic shock and has been given IV antibiotics and 30 cc per keg of crystalloid.  Despite that the patient was still hypotensive so he was started on peripheral Levophed. ? ?The patient says that the chronic  wound has been addressed in the wound center here in town with hyperbaric therapy and wound management.  However, it has been quite sometime since he saw them. ? ?The patient tells me that at baseline he has relatively low blood pressure and his systolic blood pressure typically runs in the 90s. ? ?Pertinent  Medical History  ?CAD status post CABG in 2017 ?Peripheral arterial disease status post stenting of right superficial femoral artery in 2020 ?Diabetes mellitus type 2 ?Hypertension ?Hyperlipidemia ?Systolic heart failure secondary to ischemic cardiomyopathy February 2022 echocardiogram LVEF less than 20% with severely decreased function.  RV systolic function is low normal, mild to moderate mitral valve regurgitation ?Osteomyelitis of right foot, chronic wound treated in the wound center with hyperbaric therapy and status post partial amputation by Dr. Sharol Given in the past. ? ? ?Significant Hospital Events: ?Including procedures, antibiotic start and stop dates in addition to other pertinent events   ?April 30 admitted for septic shock secondary to right foot osteomyelitis, fifth metatarsal destruction noted on foot x-ray, orthopedic surgery consulted (Dr. Sharol Given service) ? ?Interim History / Subjective:  ?As above ? ?Objective   ?Blood pressure 93/63, pulse (!) 102, temperature 100.1 ?F (37.8 ?C), temperature source Oral, resp. rate (!) 23, height '5\' 9"'$  (1.753 m), weight 124.3 kg, SpO2 95 %. ?   ?   ? ?Intake/Output Summary (Last 24 hours) at 03/08/2022 1658 ?Last data filed at 03/08/2022  1649 ?Gross per 24 hour  ?Intake 4105.84 ml  ?Output --  ?Net 4105.84 ml  ? ?Filed Weights  ? 03/08/22 1355  ?Weight: 124.3 kg  ? ? ?Examination: ? ?General:  Morbidly obese, resting comfortably in bed ?HENT: NCAT OP clear ?PULM: CTA B, normal effort ?CV: Tachycardic, no mgr, radial pulses normal ?GI: BS+, soft, nontender ?MSK: normal bulk and tone ?Derm: skin is warm and dry, no mottling, R foot ulcer lateral aspect approximately  4-5 cm in depth with drainage, surrounding erythema, foul smelling ?Neuro: awake, alert, no distress, MAEW ? ? ?Resolved Hospital Problem list   ? ? ?Assessment & Plan:  ?Septic shock secondary to right foot cellulitis and very likely osteomyelitis: ?Admit to ICU ?At this point no further fluid resuscitation needed ?Levophed through peripheral IV order set protocol ?Titrate Levophed for mean arterial pressure greater than 65 or systolic blood pressure greater than 90 ?Repeat lactic acid ?We will be mindful of the fact that his baseline systolic blood pressure is 90 ?Hold home antihypertensives and diuretics ?Vancomycin per pharmacy ?Cefepime per pharmacy ?Follow-up blood culture result ?Follow-up orthopedic consultation, will defer further imaging until we hear from them ? ?Chronic systolic heart failure, currently not in exacerbation ?Hold home Coreg and Entresto and torsemide at this time ?Monitor hemodynamics ?Monitor volume status closely ? ?Coronary artery disease: ?Peripheral vascular disease status post stenting of superficial femoral artery in past ?Hold home Plavix ?Continue home statin and Zetia ? ?Diabetes mellitus type 2: ?Hold home GLP-1 receptor agonist ?SSI, resistant scale ?Monitor glucose, goal is 140-180 ? ?AKI ?Monitor BMET and UOP ?Replace electrolytes as needed ?Hold further fluid resuscitation ? ?Best Practice (right click and "Reselect all SmartList Selections" daily)  ? ?Diet/type: Regular consistency (see orders) ?DVT prophylaxis: prophylactic heparin  ?GI prophylaxis: N/A ?Lines: N/A ?Foley:  N/A ?Code Status:  full code ?Last date of multidisciplinary goals of care discussion '[]'$  ? ?Labs   ?CBC: ?Recent Labs  ?Lab 03/08/22 ?1440  ?WBC 14.5*  ?NEUTROABS 13.6*  ?HGB 14.4  ?HCT 43.3  ?MCV 91.7  ?PLT 247  ? ? ?Basic Metabolic Panel: ?Recent Labs  ?Lab 03/08/22 ?1440  ?NA 137  ?K 4.8  ?CL 102  ?CO2 21*  ?GLUCOSE 197*  ?BUN 38*  ?CREATININE 2.01*  ?CALCIUM 8.3*  ? ?GFR: ?Estimated Creatinine  Clearance: 47.1 mL/min (A) (by C-G formula based on SCr of 2.01 mg/dL (H)). ?Recent Labs  ?Lab 03/08/22 ?1400 03/08/22 ?1440  ?WBC  --  14.5*  ?LATICACIDVEN 2.1*  --   ? ? ?Liver Function Tests: ?Recent Labs  ?Lab 03/08/22 ?1440  ?AST 28  ?ALT 24  ?ALKPHOS 71  ?BILITOT 1.0  ?PROT 6.3*  ?ALBUMIN 2.4*  ? ?No results for input(s): LIPASE, AMYLASE in the last 168 hours. ?No results for input(s): AMMONIA in the last 168 hours. ? ?ABG ?   ?Component Value Date/Time  ? PHART 7.267 (L) 10/11/2020 2778  ? PCO2ART 54.1 (H) 10/11/2020 2423  ? PO2ART 105 10/11/2020 0918  ? HCO3 24.7 10/11/2020 0918  ? TCO2 26 10/11/2020 0918  ? ACIDBASEDEF 3.0 (H) 10/11/2020 5361  ? O2SAT 55.0 10/14/2020 0707  ?  ? ?Coagulation Profile: ?Recent Labs  ?Lab 03/08/22 ?1440  ?INR 1.2  ? ? ?Cardiac Enzymes: ?No results for input(s): CKTOTAL, CKMB, CKMBINDEX, TROPONINI in the last 168 hours. ? ?HbA1C: ?Hemoglobin A1C  ?Date/Time Value Ref Range Status  ?12/23/2021 12:00 AM 6.4  Final  ?03/19/2021 12:00 AM 6.5  Final  ?  Comment:  ?  Dr.Balan  ? ? ?CBG: ?No results for input(s): GLUCAP in the last 168 hours. ? ?Review of Systems:   ?Gen: per HPI ?HEENT: Denies blurred vision, double vision, hearing loss, tinnitus, sinus congestion, rhinorrhea, sore throat, neck stiffness, dysphagia ?PULM: Denies shortness of breath, cough, sputum production, hemoptysis, wheezing ?CV: Denies chest pain, edema, orthopnea, paroxysmal nocturnal dyspnea, palpitations ?GI: Denies abdominal pain, nausea, vomiting, diarrhea, hematochezia, melena, constipation, change in bowel habits ?GU: Denies dysuria, hematuria, polyuria, oliguria, urethral discharge ?Endocrine: Denies hot or cold intolerance, polyuria, polyphagia or appetite change ?Derm: Denies rash, dry skin, scaling or peeling skin change ?Heme: Denies easy bruising, bleeding, bleeding gums ?Neuro: Denies headache, numbness, weakness, slurred speech, loss of memory or consciousness ? ? ?Past Medical History:  ?He,  has  a past medical history of CHF (congestive heart failure) ([place]), Chronic kidney disease, Colon polyp, Coronary artery disease, Diabetes mellitus (1987), Diabetic retinopathy, Dupuytren contracture, Essential hy

## 2022-03-08 NOTE — Progress Notes (Signed)
An USGPIV (ultrasound guided PIV) has been placed for short-term vasopressor infusion. A correctly placed ivWatch must be used when administering Vasopressors. Should this treatment be needed beyond 72 hours, central line access should be obtained.  It will be the responsibility of the bedside nurse to follow best practice to prevent extravasations.  Shanon Brow RN at bedside aware of placement and location of IV Watch. ?

## 2022-03-08 NOTE — ED Provider Notes (Signed)
?[facility] ?Provider Note ? ? ?CSN: [id number] ?Arrival date & time: 03/08/22  1347 ? ?  ? ?History ? ?Chief Complaint  ?Patient presents with  ? Code Sepsis  ? ? ?Eduardo [name] is a 66 y.o. male with a past medical history of chronic heart failure, pulmonary edema, stage III kidney disease, diabetes, morbid obesity, history of NSTEMI, peripheral vascular disease, history of partial foot amputation on the right who presents as a code sepsis with a foot wound.  Patient has had a wound on his right foot for approximately 2 months.  He has been using silver dressings over the wound.  He has been running intermittent fevers over the past week and began feeling poorly.  He states that this morning he felt really good and got up put on shorts and a T-shirt and was sitting in the living room with his girlfriend when he suddenly became freezing cold and started shaking vigorously.  His girlfriend got him a sweatshirt and gave him some Motrin and asked him what he wanted to do.  He replied "just shoot me I feel terrible" at which point she called 911.  EMS reports at bedside that he was found to be hypoxic to 89%, breathing rapidly.  The patient was feeling short of breath so took nitroglycerin prior to arrival and was given a second nitroglycerin by EMS prior to arrival due to his history of NSTEMI's.  He was noted to be febrile, tachycardic but original blood pressure was 297 systolic prior to getting 2 doses of nitroglycerin.  Patient denies any significant foot pain.  He has noted that his foot has become more swollen and red over the past several days.  It has been draining and has had foul odor. ? ?HPI ? ?  ? ?Home Medications ?Prior to Admission medications   ?Medication Sig Start Date End Date Taking? Authorizing Provider  ?acetaminophen (TYLENOL) 325 MG tablet Take 650 mg by mouth every 6 (six) hours as needed for mild pain (or headaches).    [provider]  ?aspirin  81 MG chewable tablet Chew 1 tablet (81 mg total) by mouth daily. 10/16/20   Adrian Prows, MD  ?B-D UF III MINI PEN NEEDLES 31G X 5 MM MISC 6 (six) times daily. 06/29/18   [provider]  ?Bioflavonoid Products (ESTER C PO) Take 1,000 mg by mouth daily.    [provider]  ?carvedilol (COREG) 3.125 MG tablet TAKE 1 TABLET BY MOUTH  TWICE DAILY WITH MEALS 10/08/21   Cantwell, Celeste C, PA-C  ?Cholecalciferol (VITAMIN D) 2000 units CAPS Take 2,000 Units by mouth daily.    [provider]  ?clopidogrel (PLAVIX) 75 MG tablet TAKE 1 TABLET BY MOUTH  DAILY 10/15/21   Cantwell, Celeste C, PA-C  ?Continuous Blood Gluc Sensor (FREESTYLE LIBRE 14 DAY SENSOR) MISC every 14 (fourteen) days. 04/14/19   [provider]  ?empagliflozin (JARDIANCE) 10 MG TABS tablet     [provider]  ?ENTRESTO 24-26 MG TAKE 1 TABLET BY MOUTH  TWICE DAILY 12/29/21   Adrian Prows, MD  ?ezetimibe (ZETIA) 10 MG tablet Take 1 tablet (10 mg total) by mouth daily. 08/20/21 08/20/22  Cantwell, Gerline Legacy, PA-C  ?gemfibrozil (LOPID) 600 MG tablet Take 600 mg by mouth 2 (two) times daily before a meal.    [provider]  ?HUMALOG KWIKPEN 100 UNIT/ML KiwkPen Inject 0-10 Units into the skin See admin instructions. Inject 0-10 units into the skin  three times a day, per sliding scale- based on BGL >100 08/02/16   [provider]  ?Camille Bal KWIKPEN 100 UNIT/ML Kiwkpen Inject 30 Units into the skin See admin instructions. Inject 30 units into the skin before breakfast and 15 U before supper 08/03/16   [provider]  ?isosorbide mononitrate (IMDUR) 60 MG 24 hr tablet TAKE 1 TABLET BY MOUTH  DAILY 10/15/21   Cantwell, Celeste C, PA-C  ?Krill Oil 300 MG CAPS     [provider]  ?MOUNJARO 2.5 MG/0.5ML Pen Inject 2.5 mg into the skin once a week. 10/15/21   [provider]  ?Multiple Vitamin (MULTIVITAMIN WITH MINERALS) TABS tablet Take 1 tablet by mouth daily.    [provider]  ?nitroGLYCERIN (NITROSTAT) 0.4 MG SL tablet Place 1 tablet (0.4 mg total) under the tongue every 5 (five) minutes as needed for chest pain. 05/08/21   Cantwell, Celeste C, PA-C  ?Probiotic Product (PROBIOTIC-10 PO) Take 1 capsule by mouth daily.    [provider]  ?rosuvastatin (CRESTOR) 20 MG tablet Take 1 tablet (20 mg total) by mouth daily. 07/22/20   Adrian Prows, MD  ?torsemide (DEMADEX) 20 MG tablet TAKE 1 TABLET BY MOUTH  TWICE DAILY AT 10 AM AND 4  PM 01/12/22   Cantwell, Celeste C, PA-C  ?   ? ?Allergies    ?Shellfish-derived products, Latex, Tape, and Testosterone   ? ?Review of Systems   ?Review of Systems ? ?Physical Exam ?Updated Vital Signs ?BP (!) 79/46   Pulse (!) 127   Temp (!) 103.2 ?F (39.6 ?C) (Oral)   Resp 16   Ht '5\' 9"'$  (1.753 m)   Wt 124.3 kg   SpO2 95%   BMI 40.46 kg/m?  ?Physical Exam ?Vitals and nursing note reviewed.  ?Constitutional:   ?   General: He is not in acute distress. ?   Appearance: He is well-developed. He is ill-appearing. He is not diaphoretic.  ?HENT:  ?   Head: Normocephalic and atraumatic.  ?Eyes:  ?   General: No scleral icterus. ?   Conjunctiva/sclera: Conjunctivae normal.  ?Cardiovascular:  ?   Rate and Rhythm: Normal rate and regular rhythm.  ?   Heart sounds: Normal heart sounds.  ?Pulmonary:  ?   Effort: Pulmonary effort is normal. No respiratory distress.  ?   Breath sounds: Normal breath sounds.  ?Abdominal:  ?   Palpations: Abdomen is soft.  ?   Tenderness: There is no abdominal tenderness.  ?Musculoskeletal:  ?   Cervical back: Normal range of motion and neck supple.  ?   Comments: Foot ulceration on the right lateral foot.  Status post ray amputation.  Gangrenous foul smell coming from the foot.  There is a black, necrotic eschar on the foot.  There is palpable bony tissue fragment sticking out of the wound.  ?Skin: ?   General: Skin is warm and dry.  ?Neurological:  ?   Mental Status: He is alert.  ?Psychiatric:     ?   Behavior: Behavior normal.   ? ? ?ED Results / Procedures / Treatments   ?Labs ?(all labs ordered are listed, but only abnormal results are displayed) ?Labs Reviewed  ?CULTURE, BLOOD (ROUTINE X 2)  ?CULTURE, BLOOD (ROUTINE X 2)  ?RESP PANEL BY RT-PCR (FLU A&B, COVID) ARPGX2  ?COMPREHENSIVE METABOLIC PANEL  ?LACTIC ACID, PLASMA  ?LACTIC ACID, PLASMA  ?CBC WITH DIFFERENTIAL/PLATELET  ?PROTIME-INR  ?URINALYSIS, ROUTINE W REFLEX MICROSCOPIC  ?APTT  ? ? ?  EKG ?None ? ?Radiology ?No results found. ? ?Procedures ?Marland KitchenCritical Care ?Performed by: Margarita Mail, PA-C ?Authorized by: Margarita Mail, PA-C  ? ?Critical care provider statement:  ?  Critical care time (minutes):  60 ?  Critical care time was exclusive of:  Separately billable procedures and treating other patients ?  Critical care was necessary to treat or prevent imminent or life-threatening deterioration of the following conditions:  Sepsis and shock ?  Critical care was time spent personally by me on the following activities:  Development of treatment plan with patient or surrogate, discussions with consultants, evaluation of patient's response to treatment, examination of patient, ordering and review of laboratory studies, ordering and review of radiographic studies, ordering and performing treatments and interventions, pulse oximetry, re-evaluation of patient's condition and review of old charts  ? ? ?Medications Ordered in ED ?Medications  ?0.9 %  sodium chloride infusion ( Intravenous New Bag/Given 03/08/22 1412)  ?metroNIDAZOLE (FLAGYL) IVPB 500 mg (has no administration in time range)  ?ceFEPIme (MAXIPIME) 2 g in sodium chloride 0.9 % 100 mL IVPB (has no administration in time range)  ?metroNIDAZOLE (FLAGYL) IVPB 500 mg (has no administration in time range)  ?vancomycin (VANCOCIN) 2,500 mg in sodium chloride 0.9 % 500 mL IVPB (has no administration in time range)  ?acetaminophen (TYLENOL) tablet 1,000 mg (1,000 mg Oral Given 03/08/22 1418)  ? ? ?ED Course/ Medical Decision Making/  A&P ?Clinical Course as of 03/08/22 1529  ?Sun Mar 08, 2022  ?1458 WBC(!): 14.5 ?Patient with white count of 14,000, he is tachycardic, tachypneic, hypotensive, hypoxic.  Fits sepsis criteria, code sepsis has b

## 2022-03-09 ENCOUNTER — Inpatient Hospital Stay (HOSPITAL_COMMUNITY): Payer: Medicare Other

## 2022-03-09 DIAGNOSIS — L0390 Cellulitis, unspecified: Secondary | ICD-10-CM | POA: Diagnosis not present

## 2022-03-09 DIAGNOSIS — A419 Sepsis, unspecified organism: Secondary | ICD-10-CM | POA: Diagnosis not present

## 2022-03-09 DIAGNOSIS — R6521 Severe sepsis with septic shock: Secondary | ICD-10-CM | POA: Diagnosis not present

## 2022-03-09 LAB — GLUCOSE, CAPILLARY
Glucose-Capillary: 271 mg/dL — ABNORMAL HIGH (ref 70–99)
Glucose-Capillary: 272 mg/dL — ABNORMAL HIGH (ref 70–99)
Glucose-Capillary: 247 mg/dL — ABNORMAL HIGH (ref 70–99)
Glucose-Capillary: 352 mg/dL — ABNORMAL HIGH (ref 70–99)
Glucose-Capillary: 324 mg/dL — ABNORMAL HIGH (ref 70–99)

## 2022-03-09 LAB — CBC
HCT: 43.1 % (ref 39.0–52.0)
HCT: 43.1 % (ref 39.0–52.0)
Hemoglobin: 13.9 g/dL (ref 13.0–17.0)
Hemoglobin: 14.4 g/dL (ref 13.0–17.0)
MCH: 30.1 pg (ref 26.0–34.0)
MCH: 30.3 pg (ref 26.0–34.0)
MCHC: 32.3 g/dL (ref 30.0–36.0)
MCHC: 33.4 g/dL (ref 30.0–36.0)
MCV: 93.3 fL (ref 80.0–100.0)
MCV: 90.7 fL (ref 80.0–100.0)
Platelets: 239 10*3/uL (ref 150–400)
Platelets: 259 10*3/uL (ref 150–400)
RBC: 4.62 MIL/uL (ref 4.22–5.81)
RBC: 4.75 MIL/uL (ref 4.22–5.81)
RDW: 13.8 % (ref 11.5–15.5)
RDW: 13.6 % (ref 11.5–15.5)
WBC: 16.8 10*3/uL — ABNORMAL HIGH (ref 4.0–10.5)
WBC: 16.4 10*3/uL — ABNORMAL HIGH (ref 4.0–10.5)
nRBC: 0 % (ref 0.0–0.2)
nRBC: 0 % (ref 0.0–0.2)

## 2022-03-09 LAB — BASIC METABOLIC PANEL
Anion gap: 15 (ref 5–15)
BUN: 39 mg/dL — ABNORMAL HIGH (ref 8–23)
CO2: 15 mmol/L — ABNORMAL LOW (ref 22–32)
Calcium: 8 mg/dL — ABNORMAL LOW (ref 8.9–10.3)
Chloride: 103 mmol/L (ref 98–111)
Creatinine, Ser: 2.02 mg/dL — ABNORMAL HIGH (ref 0.61–1.24)
GFR, Estimated: 36 mL/min — ABNORMAL LOW (ref >60)
Glucose, Bld: 290 mg/dL — ABNORMAL HIGH (ref 70–99)
Potassium: 4.6 mmol/L (ref 3.5–5.1)
Sodium: 133 mmol/L — ABNORMAL LOW (ref 135–145)

## 2022-03-09 LAB — MAGNESIUM: Magnesium: 2 mg/dL (ref 1.7–2.4)

## 2022-03-09 LAB — PHOSPHORUS: Phosphorus: 3.4 mg/dL (ref 2.5–4.6)

## 2022-03-09 LAB — MRSA NEXT GEN BY PCR, NASAL: MRSA by PCR Next Gen: NOT DETECTED

## 2022-03-09 MED ORDER — ACETAMINOPHEN 325 MG PO TABS
650.0000 mg | ORAL_TABLET | ORAL | Status: DC | PRN
  Administered 2022-03-09: 650 mg via ORAL
  Filled 2022-03-09: qty 2

## 2022-03-09 MED ORDER — FENTANYL CITRATE (PF) 100 MCG/2ML IJ SOLN
INTRAMUSCULAR | Status: AC
  Filled 2022-03-09: qty 2

## 2022-03-09 MED ORDER — MIDAZOLAM HCL 2 MG/2ML IJ SOLN
INTRAMUSCULAR | Status: AC
  Filled 2022-03-09: qty 2

## 2022-03-09 MED ORDER — INSULIN GLARGINE-YFGN 100 UNIT/ML ~~LOC~~ SOLN
10.0000 [IU] | Freq: Every day | SUBCUTANEOUS | Status: DC
  Administered 2022-03-09: 10 [IU] via SUBCUTANEOUS
  Filled 2022-03-09 (×2): qty 0.1

## 2022-03-09 MED ORDER — CALCIUM CARBONATE ANTACID 500 MG PO CHEW
1.0000 | CHEWABLE_TABLET | Freq: Three times a day (TID) | ORAL | Status: AC | PRN
  Administered 2022-03-09: 200 mg via ORAL
  Filled 2022-03-09: qty 1

## 2022-03-09 MED ORDER — TRIAMCINOLONE ACETONIDE 55 MCG/ACT NA AERO
1.0000 | INHALATION_SPRAY | Freq: Every day | NASAL | Status: DC
  Administered 2022-03-09 – 2022-03-21 (×11): 1 via NASAL
  Filled 2022-03-09: qty 10.8

## 2022-03-09 MED ORDER — KETAMINE HCL 50 MG/5ML IJ SOSY
PREFILLED_SYRINGE | INTRAMUSCULAR | Status: AC
  Filled 2022-03-09: qty 5

## 2022-03-09 MED ORDER — SUCCINYLCHOLINE CHLORIDE 200 MG/10ML IV SOSY
PREFILLED_SYRINGE | INTRAVENOUS | Status: AC
  Filled 2022-03-09: qty 10

## 2022-03-09 MED ORDER — IPRATROPIUM BROMIDE 0.02 % IN SOLN
0.5000 mg | Freq: Once | RESPIRATORY_TRACT | Status: AC | PRN
  Administered 2022-03-10: 0.5 mg via RESPIRATORY_TRACT
  Filled 2022-03-09: qty 2.5

## 2022-03-09 MED ORDER — CHLORHEXIDINE GLUCONATE CLOTH 2 % EX PADS
6.0000 | MEDICATED_PAD | Freq: Every day | CUTANEOUS | Status: DC
  Administered 2022-03-09 – 2022-03-16 (×7): 6 via TOPICAL

## 2022-03-09 MED ORDER — ETOMIDATE 2 MG/ML IV SOLN
INTRAVENOUS | Status: AC
  Filled 2022-03-09: qty 20

## 2022-03-09 MED ORDER — ROCURONIUM BROMIDE 10 MG/ML (PF) SYRINGE
PREFILLED_SYRINGE | INTRAVENOUS | Status: AC
  Filled 2022-03-09: qty 10

## 2022-03-09 NOTE — Progress Notes (Signed)
Pt arrived back to unit from MRI in respiratory distress. Pt currently on a NRB mask 15L. Pt placed on Bipap per physician order. RN aware of changes. RT will continue to monitor and be available as needed.  ?

## 2022-03-09 NOTE — Plan of Care (Signed)
°  Problem: Education: °Goal: Knowledge of General Education information will improve °Description: Including pain rating scale, medication(s)/side effects and non-pharmacologic comfort measures °Outcome: Progressing °  °Problem: Activity: °Goal: Risk for activity intolerance will decrease °Outcome: Progressing °  °Problem: Elimination: °Goal: Will not experience complications related to urinary retention °Outcome: Progressing °  °

## 2022-03-09 NOTE — Progress Notes (Signed)
Pt requesting to take bipap mask off at this time. No distress noted at this time. Pt placed on 4L nasal cannula. RN aware of changes. RT will continue to monitor and be available as needed.  ?

## 2022-03-09 NOTE — Progress Notes (Signed)
Inpatient Diabetes Program Recommendations ? ?AACE/ADA: New Consensus Statement on Inpatient Glycemic Control (2015) ? ?Target Ranges:  Prepandial:   less than 140 mg/dL ?     Peak postprandial:   less than 180 mg/dL (1-2 hours) ?     Critically ill patients:  140 - 180 mg/dL  ? ?Lab Results  ?Component Value Date  ? GLUCAP 247 (H) 03/09/2022  ? HGBA1C 6.4 12/23/2021  ? ? ?Review of Glycemic Control ? Latest Reference Range & Units 03/09/22 03:37 03/09/22 07:46 03/09/22 11:37  ?Glucose-Capillary 70 - 99 mg/dL 271 (H) 272 (H) 247 (H)  ?(H): Data is abnormally high ? ?Diabetes history: DM2 ?Outpatient Diabetes medications: Humulin N 30 units am, 15 units pm, Humalog tid meal coverage 0-10 units ?Current orders for Inpatient glycemic control: Novolog correction 0-20 units tid, 0-5 units hs ? ?Inpatient Diabetes Program Recommendations:   ?-Add Levemir 10 units bid (Total home basal 45 units qd) ?-Add Novolog 3 units tid meal coverage if eats 50 % meals ?Secure chat sent to Dr. Silas Flood. ? ?Thank you, ?Nani Gasser Lisett Dirusso, RN, MSN, CDE  ?Diabetes Coordinator ?Inpatient Glycemic Control Team ?Team Pager [phone] (8am-5pm) ?03/09/2022 12:10 PM ? ? ? ? ?

## 2022-03-09 NOTE — Progress Notes (Signed)
eLink Physician-Brief Progress Note ?Patient Name: Eduardo [name] ?DOB: [birth year] ?MRN: [id number] ? ? ?Date of Service ? 03/09/2022  ?HPI/Events of Note ? Patient with a history of indigestion at home for which he takes Tums, he's asking for a PRN order for Tums.  ?eICU Interventions ? Tums ordered.  ? ? ? ?  ? ?Frederik Pear ?03/09/2022, 12:28 AM ?

## 2022-03-09 NOTE — Progress Notes (Signed)
? ?NAME:  Eduardo [name], MRN:  [id number], DOB:  May 23, 1956, LOS: 1 ?ADMISSION DATE:  03/08/2022, CONSULTATION DATE:  4/30 ?REFERRING MD:  Francee Piccolo, CHIEF COMPLAINT:  Dyspea  ? ?History of Present Illness:  ?66 y/o male presented to the [facility] ER complaining of dyspnea, found to be in septic shock due to osteomyelitis of his right foot.   ? ?He has a complex past medical history and follows with [facility] for a complicated cardiac history which includes coronary artery disease status post triple-vessel CABG in 2017, hypertension and hyperlipidemia.  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 Sharol Given in the past.  He has a history of peripheral vascular disease and has a history of right superficial femoral artery stenting in 2020, he takes Plavix for that.  He also takes amiodarone for frequent PVCs in the setting of coronary disease and systolic heart failure. ? ?The patient tells me that over the last week he has been feeling poorly.  He has been having fevers off and on with associated chills and generalized weakness.  He threw up one time.  He denies nausea, vomiting or diarrhea.  No abdominal pain or rash.  However, his right foot wound has been swelling and draining more and has been foul-smelling for the last week.  He has not been on any antibiotics.  He said that today he started to feel more weak and was struggling to walk around in his house.  He thought that he may be having a heart attack given his extensive cardiac history so he took a nitroglycerin tablet.  At this point because he was so weak and unable to move his girlfriend called 911 and he was brought into the emergency department by EMS.  There he was recognized to have septic shock and has been given IV antibiotics and 30 cc per keg of crystalloid.  Despite that the patient was still hypotensive so he was started on peripheral Levophed. ? ?The patient says that the chronic  wound has been addressed in the wound center here in town with hyperbaric therapy and wound management.  However, it has been quite sometime since he saw them. ? ?The patient tells me that at baseline he has relatively low blood pressure and his systolic blood pressure typically runs in the 90s. ? ?Pertinent  Medical History  ?CAD status post CABG in 2017 ?Peripheral arterial disease status post stenting of right superficial femoral artery in 2020 ?Diabetes mellitus type 2 ?Hypertension ?Hyperlipidemia ?Systolic heart failure secondary to ischemic cardiomyopathy February 2022 echocardiogram LVEF less than 20% with severely decreased function.  RV systolic function is low normal, mild to moderate mitral valve regurgitation ?Osteomyelitis of right foot, chronic wound treated in the wound center with hyperbaric therapy and status post partial amputation by Dr. Sharol Given in the past. ? ?Significant Hospital Events: ?Including procedures, antibiotic start and stop dates in addition to other pertinent events   ?4/30  Admitted for septic shock secondary to right foot osteomyelitis, fifth metatarsal destruction noted on foot x-ray, orthopedic surgery consulted (Dr. Sharol Given service) ? ?Interim History / Subjective:  ?Afebrile currently, tmax 103 on admit  ?I/O - 562m UOP, +9.4L in last 24 hours ?Levo down to 44m ?Glucose trend 271-290 ?Pt requested to change to DNR.   ?Went to MRI this am and developed respiratory distress > unable to complete due to SOB in setting of cough - tried to sit up  to cough and became acutely more SOB.  Returned to ICU and placed on BiPAP.  ? ?Objective   ?Blood pressure (!) 94/55, pulse 100, temperature 97.6 ?F (36.4 ?C), temperature source Axillary, resp. rate 19, height '5\' 9"'$  (1.753 m), weight 124.3 kg, SpO2 98 %. ?   ?Vent Mode: BIPAP;PSV ?FiO2 (%):  [60 %] 60 % ?PEEP:  [5 cmH20] 5 cmH20 ?Pressure Support:  [12 cmH20] 12 cmH20  ? ?Intake/Output Summary (Last 24 hours) at 03/09/2022 1059 ?Last data  filed at 03/09/2022 1000 ?Gross per 24 hour  ?Intake 10014.02 ml  ?Output 1150 ml  ?Net 8864.02 ml  ? ?Filed Weights  ? 03/08/22 1355 03/09/22 0449  ?Weight: 124.3 kg 124.3 kg  ? ? ?Examination: ?General: pleasant adult male sitting in bed in NAD ?HEENT: MM pink/moist, good dentition, anicteric  ?Neuro: AAOx4, speech clear, MAE  ?CV: s1s2 RRR, SR with wide QRS, no m/r/g ?PULM:  non-labored at rest, lungs bilaterally clear ?GI: soft, bsx4 active  ?Extremities: warm/dry, no overt edema  ?Skin: no rashes or lesions on exposed skin ? ?Resolved Hospital Problem list   ? ? ?Assessment & Plan:  ? ?Septic Shock secondary to Right Foot Cellulitis and Osteomyelitis ?-continue levophed for MAP >65 or SBP >90 ?-baseline SBP is 90  ?-hold home entrestro, diuretics  ?-consider diuresis in am 5/2 or if further resp distress 5/1  ?-continue cefepime, vancomycin.  Flagyl discontinued 4/30  ?-follow blood cultures  ?-appreciate Dr. Sharol Given ?-attempt MRI again later today, ok to wear bipap to travel and then remove for procedure  ? ?Chronic systolic heart failure, currently not in exacerbation ?-hold home meds for now  ?-wean levophed for MAP >65 ?-follow volume status closely  ? ?Coronary artery disease, PVD ?Peripheral vascular disease status post stenting of superficial femoral artery in past ?-hold home plavix ?-continue zetia, statin ? ?Diabetes Mellitus Type 2 ?-hold home GLP-1 ?-SSI, resistant scale  ?-add semglee 10 units QD ?-glucose goal 140-180 ? ?AKI ?-Trend BMP / urinary output ?-Replace electrolytes as indicated ?-Avoid nephrotoxic agents, ensure adequate renal perfusion ? ? ?Best Practice (right click and "Reselect all SmartList Selections" daily)  ?Diet/type: Regular consistency (see orders) ?DVT prophylaxis: prophylactic heparin  ?GI prophylaxis: N/A ?Lines: N/A ?Foley:  N/A ?Code Status:  full code ?Last date of multidisciplinary goals of care discussion:  5/1 further clarified with patient, he does not want CPR in the  event of arrest and under "normal circumstances is a DNR" but is hopeful his foot issue can be managed. He would be accepting of short term intubation if necessary but would not want long term mechanical ventilation or trach.  Will change code status to LCB. ? ?Critical care time: 36 minutes ?  ? ?Noe Gens, MSN, APRN, NP-C, AGACNP-BC ? Pulmonary & Critical Care ?03/09/2022, 10:59 AM ? ? ?Please see [email or web address] for pager details.  ? ?From 7A-7P if no response, please call [phone] ?After hours, please call Warren Lacy [phone] ? ? ? ? ? ? ?

## 2022-03-09 NOTE — Progress Notes (Signed)
VASCULAR LAB ? ? ? ?ABIs have been performed. ? ?See CV proc for preliminary results. ? ? ?Kaius Daino, RVT ?03/09/2022, 11:18 AM ? ?

## 2022-03-09 NOTE — Consult Note (Signed)
? ?ORTHOPAEDIC CONSULTATION ? ?REQUESTING PHYSICIAN: Hunsucker, Bonna Gains, MD ? ?Chief Complaint: Sepsis with chronic ulcer base of the fifth metatarsal right foot. ? ?HPI: ?Eduardo [name] is a 66 y.o. male who presents with sepsis.  Patient has most recently been going to the wound center and was told the ulcer on the base of the fifth metatarsal had healed.  Patient presents with sepsis exposed necrotic bone necrotic ulcer over the base of the fifth metatarsal and cellulitis extending up the peroneal tendons. ? ?Past Medical History:  ?Diagnosis Date  ? CHF (congestive heart failure) ([place])   ? Chronic kidney disease   ? stage 3  ? Colon polyp   ? Coronary artery disease   ? Diabetes mellitus 1987  ? under care of Dr. Chalmers Cater.  On insulin since 96 (off and on)  ? Diabetic retinopathy   ? Dupuytren contracture   ? R hand, s/p injection (Dr. Lenon Curt)  ? Essential hypertension, benign   ? Essential hypertension, benign 02/06/2019  ? Frequency of urination and polyuria   ? Hypertension   ? Myocardial infarction [facility])   ? denies  ? Neuromuscular disorder ([place])   ? Diabetic neuropathy  ? Osteomyelitis ([place])   ? right foot  ? Other testicular hypofunction   ? Peripheral arterial disease ([place]) 10/28/2012  ? Peritoneal abscess ([place]) 6/08  ? and buttock.  ? Pneumonia   ? Polydipsia   ? Proteinuria   ? Pure hyperglyceridemia   ? Subacute osteomyelitis, right ankle and foot ([place])   ? Wears glasses   ? ?Past Surgical History:  ?Procedure Laterality Date  ? ABDOMINAL AORTAGRAM N/A 04/18/2012  ? Procedure: ABDOMINAL AORTAGRAM;  Surgeon: Angelia Mould, MD;  Location: [facility] CATH LAB;  Service: Cardiovascular;  Laterality: N/A;  ? AMPUTATION Right 05/19/2019  ? Procedure: RIGHT FOOT 5TH RAY AMPUTATION;  Surgeon: Newt Minion, MD;  Location: [place];  Service: Orthopedics;  Laterality: Right;  ? CARDIAC CATHETERIZATION N/A 09/08/2016  ? Procedure: Left Heart Cath and Coronary Angiography;  Surgeon: Adrian Prows, MD;  Location: [place]  CV LAB;  Service: Cardiovascular;  Laterality: N/A;  ? CATARACT EXTRACTION, BILATERAL  09/2017, 10/2017  ? Dr. Herbert Deaner  ? COLONOSCOPY W/ BIOPSIES AND POLYPECTOMY    ? CORONARY/GRAFT ACUTE MI REVASCULARIZATION N/A 10/11/2020  ? Procedure: Coronary/Graft Acute MI Revascularization;  Surgeon: Adrian Prows, MD;  Location: [place] CV LAB;  Service: Cardiovascular;  Laterality: N/A;  ? LEFT HEART CATH N/A 12/31/2020  ? Procedure: Left Heart Cath;  Surgeon: Adrian Prows, MD;  Location: [place] CV LAB;  Service: Cardiovascular;  Laterality: N/A;  ? LEFT HEART CATH AND CORONARY ANGIOGRAPHY N/A 10/11/2020  ? Procedure: LEFT HEART CATH AND CORONARY ANGIOGRAPHY;  Surgeon: Adrian Prows, MD;  Location: [place] CV LAB;  Service: Cardiovascular;  Laterality: N/A;  ? LOWER EXTREMITY ANGIOGRAM Bilateral 04/18/2012  ? Procedure: LOWER EXTREMITY ANGIOGRAM;  Surgeon: Angelia Mould, MD;  Location: [facility] CATH LAB;  Service: Cardiovascular;  Laterality: Bilateral;  bilat lower extrem angio  ? LOWER EXTREMITY ANGIOGRAPHY Bilateral 05/02/2019  ? Procedure: LOWER EXTREMITY ANGIOGRAPHY;  Surgeon: Adrian Prows, MD;  Location: [place] CV LAB;  Service: Cardiovascular;  Laterality: Bilateral;  ? LOWER EXTREMITY ANGIOGRAPHY Bilateral 02/28/2019  ? Procedure: LOWER EXTREMITY ANGIOGRAPHY;  Surgeon: Adrian Prows, MD;  Location: [place] CV LAB;  Service: Cardiovascular;  Laterality: Bilateral;  ? macular photocoagulation    ? (eye treatments for diabetic retinopathy)-Dr. Zigmund Daniel  ? PERIPHERAL VASCULAR INTERVENTION  02/28/2019  ? Procedure: PERIPHERAL VASCULAR INTERVENTION;  Surgeon: Adrian Prows, MD;  Location: [place] CV LAB;  Service: Cardiovascular;;  ? VENTRICULAR ASSIST DEVICE INSERTION N/A 12/31/2020  ? Procedure: VENTRICULAR ASSIST DEVICE INSERTION;  Surgeon: Adrian Prows, MD;  Location: [place] CV LAB;  Service: Cardiovascular;  Laterality: N/A;  ? ?Social History  ? ?Socioeconomic History  ? Marital status: Widowed  ?  Spouse name:  Not on file  ? Number of children: 0  ? Years of education: Not on file  ? Highest education level: Not on file  ?Occupational History  ? 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  Employer: [employer]  ?Tobacco Use  ? Smoking status: Former  ?  Packs/day: 1.00  ?  Years: 30.00  ?  Pack years: 30.00  ?  Types: Cigarettes  ?  Quit date: 01/08/2012  ?  Years since quitting: 10.1  ? Smokeless tobacco: Never  ?Vaping Use  ? Vaping Use: Never used  ?Substance and Sexual Activity  ? Alcohol use: Not Currently  ?  Comment: rare  ? Drug use: No  ? Sexual activity: Not Currently  ?  Partners: Female  ?  Birth control/protection: Condom  ?Other Topics Concern  ? Not on file  ?Social History Narrative  ? Widowed. Retired. No pets. Enjoys fishing.  ? Updated 06/2021  ? ?Social Determinants of Health  ? ?Financial Resource Strain: Not on file  ?Food Insecurity: Not on file  ?Transportation Needs: Not on file  ?Physical Activity: Not on file  ?Stress: Not on file  ?Social Connections: Not on file  ? ?Family History  ?Problem Relation Age of Onset  ? Diabetes Mother   ? Hearing loss Mother   ? Hypertension Mother   ? Hyperlipidemia Mother   ? Heart disease Mother   ? Varicose Veins Mother   ? Varicose Veins Father   ? Dementia Father   ? Hyperlipidemia Brother   ? Diabetes Maternal Grandmother   ? ?- negative except otherwise stated in the family history section ?Allergies  ?Allergen Reactions  ? Shellfish-Derived Products Nausea And Vomiting and Other (See Comments)  ?  Mussels cause severe nausea and vomiting ?  ? Latex Rash  ? Tape Rash and Other (See Comments)  ?  Caused issues with the skin  ? Testosterone Rash  ?  Other reaction(s): did not feel well ?Other reaction(s): did not feel well  ? ?Prior to Admission medications   ?Medication Sig Start Date End Date Taking? Authorizing Provider  ?acetaminophen (TYLENOL) 325 MG tablet Take 650 mg by mouth every 6 (six) hours as needed for mild pain (or  headaches).    [provider]  ?aspirin 81 MG chewable tablet Chew 1 tablet (81 mg total) by mouth daily. 10/16/20   Adrian Prows, MD  ?B-D UF III MINI PEN NEEDLES 31G X 5 MM MISC 6 (six) times daily. 06/29/18   [provider]  ?Bioflavonoid Products (ESTER C PO) Take 1,000 mg by mouth daily.    [provider]  ?carvedilol (COREG) 3.125 MG tablet TAKE 1 TABLET BY MOUTH  TWICE DAILY WITH MEALS 10/08/21   C[place], Celeste C, PA-C  ?Cholecalciferol (VITAMIN D) 2000 units CAPS Take 2,000 Units by mouth daily.    [provider]  ?clopidogrel (PLAVIX) 75 MG tablet TAKE 1 TABLET BY MOUTH  DAILY 10/15/21   C[place]well, Celeste C, PA-C  ?Continuous Blood Gluc Sensor (FREESTYLE LIBRE 14 DAY SENSOR) MISC  every 14 (fourteen) days. 04/14/19   [provider]  ?empagliflozin (JARDIANCE) 10 MG TABS tablet     [provider]  ?ENTRESTO 24-26 MG TAKE 1 TABLET BY MOUTH  TWICE DAILY 12/29/21   Adrian Prows, MD  ?ezetimibe (ZETIA) 10 MG tablet Take 1 tablet (10 mg total) by mouth daily. 08/20/21 08/20/22  C[place], Gerline Legacy, PA-C  ?gemfibrozil (LOPID) 600 MG tablet Take 600 mg by mouth 2 (two) times daily before a meal.    [provider]  ?HUMALOG KWIKPEN 100 UNIT/ML KiwkPen Inject 0-10 Units into the skin See admin instructions. Inject 0-10 units into the skin three times a day, per sliding scale- based on BGL >100 08/02/16   [provider]  ?Camille Bal KWIKPEN 100 UNIT/ML Kiwkpen Inject 30 Units into the skin See admin instructions. Inject 30 units into the skin before breakfast and 15 U before supper 08/03/16   [provider]  ?isosorbide mononitrate (IMDUR) 60 MG 24 hr tablet TAKE 1 TABLET BY MOUTH  DAILY 10/15/21   C[place], Celeste C, PA-C  ?Krill Oil 300 MG CAPS     [provider]  ?MOUNJARO 2.5 MG/0.5ML Pen Inject 2.5 mg into the skin once a week. 10/15/21   [provider]  ?Multiple Vitamin (MULTIVITAMIN WITH MINERALS) TABS tablet Take  1 tablet by mouth daily.    [provider]  ?nitroGLYCERIN (NITROSTAT) 0.4 MG SL tablet Place 1 tablet (0.4 mg total) under the tongue every 5 (five) minutes as needed for chest pain. 05/08/21

## 2022-03-09 NOTE — Plan of Care (Signed)

## 2022-03-09 NOTE — Significant Event (Signed)
Met with patient at bedside. Expresses his desire to be DNR. Discussed this means no ventilator or CPR/ACLS. He stated understanding. Coded status changed.  ?

## 2022-03-09 NOTE — Progress Notes (Signed)
eLink Physician-Brief Progress Note ?Patient Name: Eduardo [name] ?DOB: 24-Mar-1956 ?MRN: [id number] ? ? ?Date of Service ? 03/09/2022  ?HPI/Events of Note ? Patient needs a PRN analgesic for mild pain.  ?eICU Interventions ? PRN Tylenol ordered.  ? ? ? ?  ? ?Frederik Pear ?03/09/2022, 8:43 PM ?

## 2022-03-09 NOTE — Progress Notes (Signed)
BIPAP on stand by at this time. No distress noted. ?

## 2022-03-09 NOTE — Progress Notes (Signed)
PHARMACY - PHYSICIAN COMMUNICATION ?CRITICAL VALUE ALERT - BLOOD CULTURE IDENTIFICATION (BCID) ? ?Eduardo [name] is an 66 y.o. male who presented to [facility] on 03/08/2022 ? ?Assessment:  13 yom presenting with septic shock from foot wound, continuing on broad spectrum antibiotics. Ortho planning surgical intervention when patient stabilizes. 1 of 4 BCx bottles growing strep species. ? ?Name of physician (or Provider) Contacted: Ogan ? ?Current antibiotics: vancomycin/cefepime ? ?Changes to prescribed antibiotics recommended:  ?Patient is on recommended antibiotics - No changes needed. Continuing current therapy for now with severe wound infection and pending procedure for source control. ? ?Results for orders placed or performed during the hospital encounter of 03/08/22  ?Blood Culture ID Panel (Reflexed) (Collected: 03/08/2022  1:58 PM)  ?Result Value Ref Range  ? Enterococcus faecalis NOT DETECTED NOT DETECTED  ? Enterococcus Faecium NOT DETECTED NOT DETECTED  ? Listeria monocytogenes NOT DETECTED NOT DETECTED  ? Staphylococcus species NOT DETECTED NOT DETECTED  ? Staphylococcus aureus (BCID) NOT DETECTED NOT DETECTED  ? Staphylococcus epidermidis NOT DETECTED NOT DETECTED  ? Staphylococcus lugdunensis NOT DETECTED NOT DETECTED  ? Streptococcus species (A) NOT DETECTED  ?  CRITICAL RESULT CALLED TO, READ BACK BY AND VERIFIED WITH:  ? Streptococcus agalactiae NOT DETECTED NOT DETECTED  ? Streptococcus pneumoniae NOT DETECTED NOT DETECTED  ? Streptococcus pyogenes NOT DETECTED NOT DETECTED  ? A.calcoaceticus-baumannii NOT DETECTED NOT DETECTED  ? Bacteroides fragilis NOT DETECTED NOT DETECTED  ? Enterobacterales NOT DETECTED NOT DETECTED  ? Enterobacter cloacae complex NOT DETECTED NOT DETECTED  ? Escherichia coli NOT DETECTED NOT DETECTED  ? Klebsiella aerogenes NOT DETECTED NOT DETECTED  ? Klebsiella oxytoca NOT DETECTED NOT DETECTED  ? Klebsiella pneumoniae NOT DETECTED NOT DETECTED  ? Proteus species NOT  DETECTED NOT DETECTED  ? Salmonella species NOT DETECTED NOT DETECTED  ? Serratia marcescens NOT DETECTED NOT DETECTED  ? Haemophilus influenzae NOT DETECTED NOT DETECTED  ? Neisseria meningitidis NOT DETECTED NOT DETECTED  ? Pseudomonas aeruginosa NOT DETECTED NOT DETECTED  ? Stenotrophomonas maltophilia NOT DETECTED NOT DETECTED  ? Candida albicans NOT DETECTED NOT DETECTED  ? Candida auris NOT DETECTED NOT DETECTED  ? Candida glabrata NOT DETECTED NOT DETECTED  ? Candida krusei NOT DETECTED NOT DETECTED  ? Candida parapsilosis NOT DETECTED NOT DETECTED  ? Candida tropicalis NOT DETECTED NOT DETECTED  ? Cryptococcus neoformans/gattii NOT DETECTED NOT DETECTED  ? ?Arturo Morton, PharmD, BCPS ?Please check AMION for all [place] contact numbers ?Clinical Pharmacist ?03/09/2022 8:30 PM ? ?

## 2022-03-10 ENCOUNTER — Inpatient Hospital Stay (HOSPITAL_COMMUNITY): Payer: Medicare Other

## 2022-03-10 DIAGNOSIS — M869 Osteomyelitis, unspecified: Secondary | ICD-10-CM | POA: Diagnosis not present

## 2022-03-10 DIAGNOSIS — R6521 Severe sepsis with septic shock: Secondary | ICD-10-CM | POA: Diagnosis not present

## 2022-03-10 DIAGNOSIS — A419 Sepsis, unspecified organism: Secondary | ICD-10-CM | POA: Diagnosis not present

## 2022-03-10 LAB — BLOOD CULTURE ID PANEL (REFLEXED) - BCID2

## 2022-03-10 LAB — CBC
HCT: 40.8 % (ref 39.0–52.0)
Hemoglobin: 13.5 g/dL (ref 13.0–17.0)
MCH: 29.9 pg (ref 26.0–34.0)
MCHC: 33.1 g/dL (ref 30.0–36.0)
MCV: 90.3 fL (ref 80.0–100.0)
Platelets: 266 10*3/uL (ref 150–400)
RBC: 4.52 MIL/uL (ref 4.22–5.81)
RDW: 13.8 % (ref 11.5–15.5)
WBC: 12.2 10*3/uL — ABNORMAL HIGH (ref 4.0–10.5)
nRBC: 0 % (ref 0.0–0.2)

## 2022-03-10 LAB — GLUCOSE, CAPILLARY
Glucose-Capillary: 390 mg/dL — ABNORMAL HIGH (ref 70–99)
Glucose-Capillary: 391 mg/dL — ABNORMAL HIGH (ref 70–99)
Glucose-Capillary: 363 mg/dL — ABNORMAL HIGH (ref 70–99)
Glucose-Capillary: 342 mg/dL — ABNORMAL HIGH (ref 70–99)
Glucose-Capillary: 237 mg/dL — ABNORMAL HIGH (ref 70–99)
Glucose-Capillary: 153 mg/dL — ABNORMAL HIGH (ref 70–99)
Glucose-Capillary: 120 mg/dL — ABNORMAL HIGH (ref 70–99)
Glucose-Capillary: 112 mg/dL — ABNORMAL HIGH (ref 70–99)
Glucose-Capillary: 98 mg/dL (ref 70–99)
Glucose-Capillary: 106 mg/dL — ABNORMAL HIGH (ref 70–99)

## 2022-03-10 LAB — BASIC METABOLIC PANEL
Anion gap: 10 (ref 5–15)
BUN: 40 mg/dL — ABNORMAL HIGH (ref 8–23)
CO2: 21 mmol/L — ABNORMAL LOW (ref 22–32)
Calcium: 8.1 mg/dL — ABNORMAL LOW (ref 8.9–10.3)
Chloride: 102 mmol/L (ref 98–111)
Creatinine, Ser: 1.8 mg/dL — ABNORMAL HIGH (ref 0.61–1.24)
GFR, Estimated: 41 mL/min — ABNORMAL LOW (ref >60)
Glucose, Bld: 352 mg/dL — ABNORMAL HIGH (ref 70–99)
Potassium: 4.3 mmol/L (ref 3.5–5.1)
Sodium: 133 mmol/L — ABNORMAL LOW (ref 135–145)

## 2022-03-10 LAB — SURGICAL PCR SCREEN
MRSA, PCR: NEGATIVE
Staphylococcus aureus: NEGATIVE

## 2022-03-10 LAB — MAGNESIUM: Magnesium: 2.2 mg/dL (ref 1.7–2.4)

## 2022-03-10 LAB — PHOSPHORUS: Phosphorus: 2 mg/dL — ABNORMAL LOW (ref 2.5–4.6)

## 2022-03-10 MED ORDER — DEXTROSE 50 % IV SOLN: 0.0000 mL | INTRAVENOUS | Status: DC | PRN

## 2022-03-10 MED ORDER — INSULIN REGULAR(HUMAN) IN NACL 100-0.9 UT/100ML-% IV SOLN: INTRAVENOUS | Status: DC

## 2022-03-10 MED ORDER — INSULIN ASPART 100 UNIT/ML IJ SOLN
2.0000 [IU] | INTRAMUSCULAR | Status: DC
  Administered 2022-03-11: 6 [IU] via SUBCUTANEOUS
  Administered 2022-03-11: 4 [IU] via SUBCUTANEOUS
  Administered 2022-03-11: 2 [IU] via SUBCUTANEOUS

## 2022-03-10 MED ORDER — SODIUM CHLORIDE 0.9 % IV SOLN
2.0000 ug/min | INTRAVENOUS | Status: DC
  Administered 2022-03-10: 3 ug/min via INTRAVENOUS
  Administered 2022-03-11: 7 ug/min via INTRAVENOUS
  Filled 2022-03-10 (×6): qty 4

## 2022-03-10 MED ORDER — INSULIN GLARGINE-YFGN 100 UNIT/ML ~~LOC~~ SOLN
40.0000 [IU] | Freq: Every day | SUBCUTANEOUS | Status: DC
  Administered 2022-03-10: 40 [IU] via SUBCUTANEOUS
  Filled 2022-03-10 (×2): qty 0.4

## 2022-03-10 MED ORDER — INSULIN REGULAR(HUMAN) IN NACL 100-0.9 UT/100ML-% IV SOLN
INTRAVENOUS | Status: DC
  Administered 2022-03-10: 13 [IU]/h via INTRAVENOUS
  Filled 2022-03-10: qty 100

## 2022-03-10 MED ORDER — ALPRAZOLAM 0.25 MG PO TABS
0.2500 mg | ORAL_TABLET | Freq: Once | ORAL | Status: AC
  Administered 2022-03-10: 0.25 mg via ORAL
  Filled 2022-03-10: qty 1

## 2022-03-10 MED ORDER — LACTATED RINGERS IV SOLN: INTRAVENOUS | Status: DC

## 2022-03-10 MED ORDER — NOREPINEPHRINE 4 MG/250ML-% IV SOLN
2.0000 ug/min | INTRAVENOUS | Status: DC
  Administered 2022-03-10: 3 ug/min via INTRAVENOUS

## 2022-03-10 MED ORDER — INSULIN DETEMIR 100 UNIT/ML ~~LOC~~ SOLN
5.0000 [IU] | Freq: Two times a day (BID) | SUBCUTANEOUS | Status: DC
  Administered 2022-03-10 – 2022-03-11 (×3): 5 [IU] via SUBCUTANEOUS
  Filled 2022-03-10 (×5): qty 0.05

## 2022-03-10 MED ORDER — TRAMADOL HCL 50 MG PO TABS
50.0000 mg | ORAL_TABLET | Freq: Four times a day (QID) | ORAL | Status: DC | PRN
  Administered 2022-03-10 – 2022-03-11 (×5): 50 mg via ORAL
  Filled 2022-03-10 (×6): qty 1

## 2022-03-10 MED ORDER — VANCOMYCIN HCL 1500 MG/300ML IV SOLN
1500.0000 mg | INTRAVENOUS | Status: DC
  Administered 2022-03-10: 1500 mg via INTRAVENOUS
  Filled 2022-03-10 (×2): qty 300

## 2022-03-10 MED ORDER — DEXTROSE IN LACTATED RINGERS 5 % IV SOLN: INTRAVENOUS | Status: DC

## 2022-03-10 MED ORDER — INSULIN ASPART 100 UNIT/ML IJ SOLN
20.0000 [IU] | Freq: Once | INTRAMUSCULAR | Status: AC
  Administered 2022-03-10: 20 [IU] via SUBCUTANEOUS

## 2022-03-10 MED ORDER — SODIUM PHOSPHATES 45 MMOLE/15ML IV SOLN
15.0000 mmol | Freq: Once | INTRAVENOUS | Status: AC
  Administered 2022-03-10: 15 mmol via INTRAVENOUS
  Filled 2022-03-10: qty 5

## 2022-03-10 MED ORDER — ACETAMINOPHEN 325 MG PO TABS
650.0000 mg | ORAL_TABLET | Freq: Three times a day (TID) | ORAL | Status: DC
Start: 2022-03-10 — End: 2022-03-21
  Administered 2022-03-10 – 2022-03-21 (×34): 650 mg via ORAL
  Filled 2022-03-10 (×34): qty 2

## 2022-03-10 MED ORDER — INSULIN GLARGINE-YFGN 100 UNIT/ML ~~LOC~~ SOLN: 20.0000 [IU] | Freq: Every day | SUBCUTANEOUS | Status: DC

## 2022-03-10 NOTE — H&P (View-Only) (Signed)
Patient ID: Eduardo [name], male   DOB: [birth year], 66 y.o.   MRN: [id number] ? ?Patient is seen in follow-up for the abscess, osteomyelitis, ulceration right lower extremity.  Ankle-brachial indices shows monophasic flow to the right lower extremity with multiphasic flow to the left lower extremity.  ABI on the right 70% ABI in the left 100%.  Anticipate obtaining the MRI scan today.  Discussed with the patient depending on the findings of the MRI scan we can proceed with surgical intervention.  Discussed that there will be some type of amputation required.  Patient states he understands and is ready to proceed with surgery. ?

## 2022-03-10 NOTE — Progress Notes (Addendum)
? ?NAME:  Eduardo [name], MRN:  [id number], DOB:  Jul 11, 1956, LOS: 2 ?ADMISSION DATE:  03/08/2022, CONSULTATION DATE:  4/30 ?REFERRING MD:  Francee Piccolo, CHIEF COMPLAINT:  Dyspea  ? ?History of Present Illness:  ?66 y/o male presented to the [facility] ER complaining of dyspnea, found to be in septic shock due to osteomyelitis of his right foot.   ? ?He has a complex past medical history and follows with [facility] for a complicated cardiac history which includes coronary artery disease status post triple-vessel CABG in 2017, hypertension and hyperlipidemia.  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 Sharol Given in the past.  He has a history of peripheral vascular disease and has a history of right superficial femoral artery stenting in 2020, he takes Plavix for that.  He also takes amiodarone for frequent PVCs in the setting of coronary disease and systolic heart failure. ? ?The patient tells me that over the last week he has been feeling poorly.  He has been having fevers off and on with associated chills and generalized weakness.  He threw up one time.  He denies nausea, vomiting or diarrhea.  No abdominal pain or rash.  However, his right foot wound has been swelling and draining more and has been foul-smelling for the last week.  He has not been on any antibiotics.  He said that today he started to feel more weak and was struggling to walk around in his house.  He thought that he may be having a heart attack given his extensive cardiac history so he took a nitroglycerin tablet.  At this point because he was so weak and unable to move his girlfriend called 911 and he was brought into the emergency department by EMS.  There he was recognized to have septic shock and has been given IV antibiotics and 30 cc per keg of crystalloid.  Despite that the patient was still hypotensive so he was started on peripheral Levophed. ? ?The patient says that the chronic  wound has been addressed in the wound center here in town with hyperbaric therapy and wound management.  However, it has been quite sometime since he saw them. ? ?The patient tells me that at baseline he has relatively low blood pressure and his systolic blood pressure typically runs in the 90s. ? ?Pertinent  Medical History  ?CAD status post CABG in 2017 ?Peripheral arterial disease status post stenting of right superficial femoral artery in 2020 ?Diabetes mellitus type 2 ?Hypertension ?Hyperlipidemia ?Systolic heart failure secondary to ischemic cardiomyopathy February 2022 echocardiogram LVEF less than 20% with severely decreased function.  RV systolic function is low normal, mild to moderate mitral valve regurgitation ?Osteomyelitis of right foot, chronic wound treated in the wound center with hyperbaric therapy and status post partial amputation by Dr. Sharol Given in the past. ? ?Significant Hospital Events: ?Including procedures, antibiotic start and stop dates in addition to other pertinent events   ?4/30  Admitted for septic shock secondary to right foot osteomyelitis, fifth metatarsal destruction noted on foot x-ray, orthopedic surgery consulted (Dr. Sharol Given service) ?5/1 Went to MRI this am and developed respiratory distress > unable to complete due to SOB in setting of cough - tried to sit up to cough and became acutely more SOB.  Returned to ICU and placed on BiPAP. Changed to LCB ?5/2 remains on 4 mcg levophed, glucose elevated (received d5w with electrolyte replacement) ? ?Interim History / Subjective:  ?  Afebrile / WBC improved  ?4/4 blood cultures growing strep anginosis ?Glucose range 247-363 (received 'bolus' of dextrose with sodium phosphate replacement) ?I/O 1.6L UOP, -61 ml in last 24 hours  ?Pt reports foot pain on the right this am, requesting something for pain  ? ?Objective   ?Blood pressure 100/66, pulse 95, temperature 98.7 ?F (37.1 ?C), temperature source Oral, resp. rate (!) 23, height '5\' 9"'$  (1.753  m), weight 124.3 kg, SpO2 93 %. ?   ?Vent Mode: BIPAP;PCV ?FiO2 (%):  [40 %] 40 % ?Set Rate:  [14 bmp] 14 bmp ?PEEP:  [5 cmH20] 5 cmH20  ? ?Intake/Output Summary (Last 24 hours) at 03/10/2022 1253 ?Last data filed at 03/10/2022 1200 ?Gross per 24 hour  ?Intake 2018.6 ml  ?Output 1050 ml  ?Net 968.6 ml  ? ?Filed Weights  ? 03/08/22 1355 03/09/22 0449  ?Weight: 124.3 kg 124.3 kg  ? ? ?Examination: ?General: pleasant adult male sitting up in bed in NAD ?HEENT: MM pink/moist, anicteric ?Neuro: AAOx4, speech clear, MAE ?CV: s1s2 RRR, wide QRS but SR, no m/r/g ?PULM: non-labored at rest, lungs bilaterally clear anterior, diminished bases ?GI: soft, bsx4 active  ?Extremities: warm/dry, no overt edema  ?Skin: no rashes on exposed skin. R foot wrapped with known foot wound  ? ?Resolved Hospital Problem list   ? ? ?Assessment & Plan:  ? ?Septic Shock secondary to Right Foot Cellulitis / Osteomyelitis and Strep Bacteremia  ?Baseline SBP 90 ?-continue levophed for SBP >90 ?-hold home entresto, diuresis  ?-continue cefepime, vancomycin until source control  ?-appreciate Dr. Jess Barters assistance with patient care ?-await MRI, ok to wear bipap to MRI and back but not during procedure for patient comfort ?-have ordered TEE, working to coordinate with Cardiology to perform intra-operatively  ? ?Chronic systolic heart failure, currently not in exacerbation ?-hold home medications for now with sepsis/shock  ?-levophed as above  ?-follow volume status closely ? ?Coronary artery disease, PVD ?Peripheral vascular disease status post stenting of superficial femoral artery in past ?-hold home plavix with impending surgery  ?-continue zetia, statin ? ?Diabetes Mellitus Type 2 with Hyperglycemia  ?-hold home GLP-1  ?-SSI, resistant scale  ?-increase semglee to 40 units  ?-goal glucose 140-180 ? ?AKI ?-Trend BMP / urinary output ?-Replace electrolytes as indicated ?-Avoid nephrotoxic agents, ensure adequate renal perfusion ? ?Hypophosphatemia   ?-replace as indicated > try to avoid additional glucose IV, use oral replacement  ? ?Best Practice (right click and "Reselect all SmartList Selections" daily)  ?Diet/type: Regular consistency (see orders) ?DVT prophylaxis: prophylactic heparin  ?GI prophylaxis: N/A ?Lines: N/A ?Foley:  N/A ?Code Status:  full code ?Last date of multidisciplinary goals of care discussion:  5/1 further clarified with patient, he does not want CPR in the event of arrest and under "normal circumstances is a DNR" but is hopeful his foot issue can be managed. He would be accepting of short term intubation if necessary but would not want long term mechanical ventilation or trach. Code status to LCB. ? ?Critical care time: 34 minutes  ? ?Noe Gens, MSN, APRN, NP-C, AGACNP-BC ?[facility] ?03/10/2022, 12:53 PM ? ? ?Please see [email or web address] for pager details.  ? ?From 7A-7P if no response, please call [phone] ?After hours, please call Warren Lacy [phone] ? ? ?

## 2022-03-10 NOTE — Progress Notes (Signed)
PHARMACY - PHYSICIAN COMMUNICATION ?CRITICAL VALUE ALERT - BLOOD CULTURE IDENTIFICATION (BCID) ? ?Eduardo [name] is an 66 y.o. male who presented to [facility] on 03/08/2022 ? ?Assessment:  44 yom presenting with septic shock from foot wound, continuing on broad spectrum antibiotics. Ortho planning surgical intervention when patient stabilizes. 1 of 4 BCx bottles growing strep species. ? ?Update 5/2: Micro lab updated that 2/4 bottles with Strep spp.  ? ?Name of physician (or Provider) Contacted: Ogan and CCM team ? ?Current antibiotics: vancomycin/cefepime ? ?Changes to prescribed antibiotics recommended:  ?Patient is on recommended antibiotics - No changes needed. Continuing current therapy for now with severe wound infection and pending procedure for source control. ? ?Results for orders placed or performed during the hospital encounter of 03/08/22  ?Blood Culture ID Panel (Reflexed) (Collected: 03/08/2022  1:58 PM)  ?Result Value Ref Range  ? Enterococcus faecalis NOT DETECTED NOT DETECTED  ? Enterococcus Faecium NOT DETECTED NOT DETECTED  ? Listeria monocytogenes NOT DETECTED NOT DETECTED  ? Staphylococcus species NOT DETECTED NOT DETECTED  ? Staphylococcus aureus (BCID) NOT DETECTED NOT DETECTED  ? Staphylococcus epidermidis NOT DETECTED NOT DETECTED  ? Staphylococcus lugdunensis NOT DETECTED NOT DETECTED  ? Streptococcus species (A) NOT DETECTED  ?  CRITICAL RESULT CALLED TO, READ BACK BY AND VERIFIED WITH:  ? Streptococcus agalactiae NOT DETECTED NOT DETECTED  ? Streptococcus pneumoniae NOT DETECTED NOT DETECTED  ? Streptococcus pyogenes NOT DETECTED NOT DETECTED  ? A.calcoaceticus-baumannii NOT DETECTED NOT DETECTED  ? Bacteroides fragilis NOT DETECTED NOT DETECTED  ? Enterobacterales NOT DETECTED NOT DETECTED  ? Enterobacter cloacae complex NOT DETECTED NOT DETECTED  ? Escherichia coli NOT DETECTED NOT DETECTED  ? Klebsiella aerogenes NOT DETECTED NOT DETECTED  ? Klebsiella oxytoca NOT DETECTED NOT DETECTED   ? Klebsiella pneumoniae NOT DETECTED NOT DETECTED  ? Proteus species NOT DETECTED NOT DETECTED  ? Salmonella species NOT DETECTED NOT DETECTED  ? Serratia marcescens NOT DETECTED NOT DETECTED  ? Haemophilus influenzae NOT DETECTED NOT DETECTED  ? Neisseria meningitidis NOT DETECTED NOT DETECTED  ? Pseudomonas aeruginosa NOT DETECTED NOT DETECTED  ? Stenotrophomonas maltophilia NOT DETECTED NOT DETECTED  ? Candida albicans NOT DETECTED NOT DETECTED  ? Candida auris NOT DETECTED NOT DETECTED  ? Candida glabrata NOT DETECTED NOT DETECTED  ? Candida krusei NOT DETECTED NOT DETECTED  ? Candida parapsilosis NOT DETECTED NOT DETECTED  ? Candida tropicalis NOT DETECTED NOT DETECTED  ? Cryptococcus neoformans/gattii NOT DETECTED NOT DETECTED  ? ?Lestine Box, PharmD ?PGY2 Infectious Diseases Pharmacy Resident ?  ?Please check [email or web address] for unit-specific pharmacy phone numbers  ? ?

## 2022-03-10 NOTE — Progress Notes (Signed)
Phos 2.0 ?Replaced per protocol  ?

## 2022-03-10 NOTE — Progress Notes (Signed)
Pharmacy Antibiotic Note ? ?Eduardo [name] is a 65 y.o. male admitted on 03/08/2022 with sepsis likely secondary to foot wound.  Pharmacy has been consulted for Cefepime and vancomycin dosing. ? ?WBC 12.2, trending down ?SCr 1.8, trending down, CrCl ~50-55 ml/min ?Afebrile ?Hypotensive - requiring norepinephrine 64mg/min ? ?Plan: ?Empirically increase Vancomycin dose to 1500 mg IV q24h (expected AUC 442) ?   >Goal AUC 400-550 ?Continue Cefepime 2 gm IV q12h ?Planning for amputation with source control per Dr. DSharol Givenon 5/3 ?Monitor CBC, renal fx, cultures and clinical progress ?Vanc levels as indicated  ? ? ?Height: '5\' 9"'$  (175.3 cm) ?Weight: 124.3 kg (274 lb 0.5 oz) ?IBW/kg (Calculated) : 70.7 ? ?Temp (24hrs), Avg:98.2 ?F (36.8 ?C), Min:97.8 ?F (36.6 ?C), Max:98.7 ?F (37.1 ?C) ? ?Recent Labs  ?Lab 03/08/22 ?1400 03/08/22 ?1440 03/08/22 ?1732 03/08/22 ?1947 03/09/22 ?0202 03/09/22 ?1244 03/10/22 ?0221  ?WBC  --  14.5*  --  21.5* 16.8* 16.4* 12.2*  ?CREATININE  --  2.01*  --  2.07* 2.02*  --  1.80*  ?LATICACIDVEN 2.1*  --  1.4 1.3  --   --   --   ?  ?Estimated Creatinine Clearance: 52.6 mL/min (A) (by C-G formula based on SCr of 1.8 mg/dL (H)).   ? ?Allergies  ?Allergen Reactions  ? Shellfish-Derived Products Nausea And Vomiting and Other (See Comments)  ?  Only Mussels cause severe nausea and vomiting ?  ? Latex Rash  ? Tape Rash and Other (See Comments)  ?  Caused issues with the skin  ? Testosterone Rash  ?  Other reaction(s): did not feel well ?Other reaction(s): did not feel well  ? ? ?Antimicrobials this admission: ?Cefepime 4/30 >>  ?Vancomycin 4/30 >>  ?Metronidazole 4/30 x1 ? ?Dose adjustments this admission: ?5/2 Vanc incr'd to '1500mg'$  q24h (empiric) ? ?Microbiology results: ?4/30 MRSA PCR negative ?4/30 Blood cx - 4/4 Strep anginosis ? ?Thank you for allowing pharmacy to be a part of this patient?s care. ? ?CLuisa Hart PharmD, BCPS ?Clinical Pharmacist ?03/10/2022 11:02 AM  ? ?Please refer to [facility]for pharmacy  phone number  ?

## 2022-03-10 NOTE — Progress Notes (Signed)
eLink Physician-Brief Progress Note ?Patient Name: Eduardo [name] ?DOB: 1955-11-29 ?MRN: [id number] ? ? ?Date of Service ? 03/10/2022  ?HPI/Events of Note ? Patient is scheduled for MRI tonight and is asking for something to help with anxiety.  ?eICU Interventions ? Xanax 0.25 mg po x 1 ordered.  ? ? ? ?  ? ?[name] ?03/10/2022, 8:44 PM ?

## 2022-03-10 NOTE — Progress Notes (Signed)
Patient ID: Eduardo [name], male   DOB: 02/23/1956, 66 y.o.   MRN: [id number] ? ?Patient is seen in follow-up for the abscess, osteomyelitis, ulceration right lower extremity.  Ankle-brachial indices shows monophasic flow to the right lower extremity with multiphasic flow to the left lower extremity.  ABI on the right 70% ABI in the left 100%.  Anticipate obtaining the MRI scan today.  Discussed with the patient depending on the findings of the MRI scan we can proceed with surgical intervention.  Discussed that there will be some type of amputation required.  Patient states he understands and is ready to proceed with surgery. ?

## 2022-03-10 NOTE — Plan of Care (Signed)
  Problem: Health Behavior/Discharge Planning: Goal: Ability to manage health-related needs will improve Outcome: Progressing   Problem: Activity: Goal: Risk for activity intolerance will decrease Outcome: Progressing   Problem: Nutrition: Goal: Adequate nutrition will be maintained Outcome: Progressing   

## 2022-03-10 NOTE — TOC Progression Note (Signed)
Transition of Care (TOC) - Initial/Assessment Note  ? ? ?Patient Details  ?Name: Eduardo [name] ?MRN: [id number] ?Date of Birth: [birth year] ? ?Transition of Care (TOC) CM/SW Contact:    ?Paulene Floor Jordana Dugue, LCSWA ?Phone Number: ?03/10/2022, 12:07 PM ? ?Clinical Narrative:                 ? ?Patient admitted for sepsis likely secondary to foot wound.  Transition of Care Department [facility]) has reviewed patient and no TOC needs have been identified at this time. We will continue to monitor patient advancement through interdisciplinary progression rounds. If new patient transition needs arise, please place a TOC consult. ?  ? ?  ?  ? ? ?Patient Goals and CMS Choice ?  ?  ?  ? ?Expected Discharge Plan and Services ?  ?  ?  ?  ?  ?                ?  ?  ?  ?  ?  ?  ?  ?  ?  ?  ? ?Prior Living Arrangements/Services ?  ?  ?  ?       ?  ?  ?  ?  ? ?Activities of Daily Living ?Home Assistive Devices/Equipment: Eyeglasses, CBG Meter ?ADL Screening (condition at time of admission) ?Patient's cognitive ability adequate to safely complete daily activities?: Yes ?Is the patient deaf or have difficulty hearing?: No ?Does the patient have difficulty seeing, even when wearing glasses/contacts?: No ?Does the patient have difficulty concentrating, remembering, or making decisions?: No ?Patient able to express need for assistance with ADLs?: Yes ?Does the patient have difficulty dressing or bathing?: No ?Independently performs ADLs?: Yes (appropriate for developmental age) ?Does the patient have difficulty walking or climbing stairs?: No ?Weakness of Legs: None ?Weakness of Arms/Hands: None ? ?Permission Sought/Granted ?  ?  ?   ?   ?   ?   ? ?Emotional Assessment ?  ?  ?  ?  ?  ?  ? ?Admission diagnosis:  History of congestive heart failure [Z86.79] ?Acute respiratory failure with hypoxia ([place]) [J96.01] ?AKI (acute kidney injury) ([place]) [N17.9] ?Septic shock ([place]) [A41.9, R65.21] ?Osteomyelitis of right foot, unspecified type ([place])  [M86.9] ?Patient Active Problem List  ? Diagnosis Date Noted  ? Septic shock ([place]) 03/08/2022  ? Acute systolic heart failure ([place]) 12/28/2020  ? Acute pulmonary edema ([place]) 10/11/2020  ? Non-ST elevation (NSTEMI) myocardial infarction ([place]) 10/11/2020  ? Acute respiratory distress 10/11/2020  ? Acute on chronic systolic heart failure ([place]) 10/11/2020  ? Cardiogenic shock ([place])   ? Partial nontraumatic amputation of foot, right ([place]) 06/21/2019  ? Ischemic ulcer of lower leg due to atherosclerosis ([place]) 02/28/2019  ? Essential hypertension, benign 02/06/2019  ? Hypercholesteremia 02/06/2019  ? CKD (chronic kidney disease) stage 3, GFR 30-59 ml/min (HCC) 10/06/2017  ? Proteinuria 10/06/2017  ? Type 2 diabetes mellitus with nephropathy ([place]) 10/06/2017  ? 3-vessel coronary artery disease 05/23/2017  ? Abnormal nuclear stress test 09/06/2016  ? PAD (peripheral artery disease) ([place]) 10/28/2012  ? Morbid obesity with BMI of 40.0-44.9, adult ([place]) 10/13/2012  ? Diabetes mellitus with ophthalmic complication ([place]) 27/01/5008  ? Hypertension associated with diabetes ([place]) 10/13/2012  ? Atherosclerosis of native artery of extremity with intermittent claudication ([place]) 04/05/2012  ? Adenomatous colon polyp 09/28/2011  ? Pure hyperglyceridemia 09/28/2011  ? Erectile dysfunction 09/28/2011  ? Diabetic retinopathy ([place]) 09/28/2011  ? ?PCP:  Rita Ohara, MD ?Pharmacy:   ?[id number] [address] ?[place] ?[place] [address] ?Phone: [phone] Fax: [phone] ? ?[place] ([facility] ) - [address], [address] ?[address] ?[place] [address] ?Phone: [phone] Fax: [phone] ? ?[facility] ?[address] ?[place] [address] ?Phone: [phone] Fax: [phone] ? ?[facility] ([address] ?[address] ?[address] ?[place] [address] ?Phone: [phone] Fax: [phone] ? ? ? ? ?Social Determinants of Health (SDOH) Interventions ?  ? ?Readmission Risk Interventions ?   ? View : No data to display.  ?  ?  ?  ? ? ? ?

## 2022-03-10 NOTE — Progress Notes (Signed)
Inpatient Diabetes Program Recommendations ? ?AACE/ADA: New Consensus Statement on Inpatient Glycemic Control (2015) ? ?Target Ranges:  Prepandial:   less than 140 mg/dL ?     Peak postprandial:   less than 180 mg/dL (1-2 hours) ?     Critically ill patients:  140 - 180 mg/dL  ? ? Latest Reference Range & Units 03/09/22 07:46 03/09/22 11:37 03/09/22 15:33 03/09/22 21:06  ?Glucose-Capillary 70 - 99 mg/dL 272 (H) ? ?11 units Novolog ? 247 (H) ? ?7 units Novolog ? ?10 units Semglee '@1330'$  ? 352 (H) ? ?20 units Novolog ? 324 (H) ? ?4 units Novolog ?  ? ? Latest Reference Range & Units 03/10/22 07:41 03/10/22 09:54  ?Glucose-Capillary 70 - 99 mg/dL 390 (H) ? ?20 units Novolog ? 391 (H) ? ?20 units Novolog ? ?40 units Semglee  ?(H): Data is abnormally high ? ? ? ?Home DM Meds: Humulin N 30 units am/ 15 units pm ?    Humalog 0-10 units TID with meals ? ?Current Orders: Semglee 40 units daily ?     Novolog Resistant Correction Scale/ SSI (0-20 units) TID AC + HS ? ? ? ?MD- Note Semglee increased to 40 units daily this AM ? ?Please also consider starting Novolog Meal Coverage: Novolog 6 units TID with meals ?HOLD if pt eats >50% meals ? ? ? ?--Will follow patient during hospitalization-- ? ?Wyn Quaker RN, MSN, CDE ?Diabetes Coordinator ?Inpatient Glycemic Control Team ?Team Pager: [phone] (8a-5p) ? ? ?

## 2022-03-10 NOTE — Progress Notes (Signed)
eLink Physician-Brief Progress Note ?Patient Name: Eduardo [name] ?DOB: [birth year] ?MRN: [id number] ? ? ?Date of Service ? 03/10/2022  ?HPI/Events of Note ? Patient needs Insulin transition from gtt orders.  ?eICU Interventions ? Orders entered.  ? ? ? ?Intervention Category ?Intermediate Interventions: Other: ? ?Kerry Kass Keron Neenan ?03/10/2022, 10:14 PM ?

## 2022-03-11 ENCOUNTER — Encounter (HOSPITAL_COMMUNITY): Payer: Self-pay | Admitting: Pulmonary Disease

## 2022-03-11 ENCOUNTER — Other Ambulatory Visit: Payer: Self-pay

## 2022-03-11 ENCOUNTER — Inpatient Hospital Stay (HOSPITAL_COMMUNITY): Payer: Medicare Other | Admitting: Anesthesiology

## 2022-03-11 ENCOUNTER — Encounter (HOSPITAL_COMMUNITY)
Admission: EM | Disposition: A | Discharge status: Rehabilitation Facility | Payer: Self-pay | Source: Home / Self Care | Attending: Family Medicine

## 2022-03-11 DIAGNOSIS — A419 Sepsis, unspecified organism: Secondary | ICD-10-CM | POA: Diagnosis not present

## 2022-03-11 DIAGNOSIS — I110 Hypertensive heart disease with heart failure: Secondary | ICD-10-CM

## 2022-03-11 DIAGNOSIS — M869 Osteomyelitis, unspecified: Secondary | ICD-10-CM | POA: Diagnosis not present

## 2022-03-11 DIAGNOSIS — I2510 Atherosclerotic heart disease of native coronary artery without angina pectoris: Secondary | ICD-10-CM

## 2022-03-11 DIAGNOSIS — L02415 Cutaneous abscess of right lower limb: Secondary | ICD-10-CM

## 2022-03-11 DIAGNOSIS — I509 Heart failure, unspecified: Secondary | ICD-10-CM

## 2022-03-11 DIAGNOSIS — R6521 Severe sepsis with septic shock: Secondary | ICD-10-CM | POA: Diagnosis not present

## 2022-03-11 HISTORY — PX: AMPUTATION: SHX166

## 2022-03-11 HISTORY — PX: I & D EXTREMITY: SHX5045

## 2022-03-11 LAB — CBC WITH DIFFERENTIAL/PLATELET
Abs Immature Granulocytes: 0.11 10*3/uL — ABNORMAL HIGH (ref 0.00–0.07)
Basophils Absolute: 0.1 10*3/uL (ref 0.0–0.1)
Basophils Relative: 1 %
Eosinophils Absolute: 0.2 10*3/uL (ref 0.0–0.5)
Eosinophils Relative: 2 %
HCT: 41.8 % (ref 39.0–52.0)
Hemoglobin: 13.9 g/dL (ref 13.0–17.0)
Immature Granulocytes: 1 %
Lymphocytes Relative: 10 %
Lymphs Abs: 1.2 10*3/uL (ref 0.7–4.0)
MCH: 30.2 pg (ref 26.0–34.0)
MCHC: 33.3 g/dL (ref 30.0–36.0)
MCV: 90.9 fL (ref 80.0–100.0)
Monocytes Absolute: 1.2 10*3/uL — ABNORMAL HIGH (ref 0.1–1.0)
Monocytes Relative: 10 %
Neutro Abs: 9.8 10*3/uL — ABNORMAL HIGH (ref 1.7–7.7)
Neutrophils Relative %: 76 %
Platelets: 305 10*3/uL (ref 150–400)
RBC: 4.6 MIL/uL (ref 4.22–5.81)
RDW: 13.9 % (ref 11.5–15.5)
WBC: 12.7 10*3/uL — ABNORMAL HIGH (ref 4.0–10.5)
nRBC: 0 % (ref 0.0–0.2)

## 2022-03-11 LAB — GLUCOSE, CAPILLARY
Glucose-Capillary: 108 mg/dL — ABNORMAL HIGH (ref 70–99)
Glucose-Capillary: 109 mg/dL — ABNORMAL HIGH (ref 70–99)
Glucose-Capillary: 116 mg/dL — ABNORMAL HIGH (ref 70–99)
Glucose-Capillary: 116 mg/dL — ABNORMAL HIGH (ref 70–99)
Glucose-Capillary: 121 mg/dL — ABNORMAL HIGH (ref 70–99)
Glucose-Capillary: 129 mg/dL — ABNORMAL HIGH (ref 70–99)
Glucose-Capillary: 131 mg/dL — ABNORMAL HIGH (ref 70–99)
Glucose-Capillary: 160 mg/dL — ABNORMAL HIGH (ref 70–99)
Glucose-Capillary: 249 mg/dL — ABNORMAL HIGH (ref 70–99)

## 2022-03-11 LAB — CULTURE, BLOOD (ROUTINE X 2): Special Requests: ADEQUATE

## 2022-03-11 LAB — MAGNESIUM: Magnesium: 2.2 mg/dL (ref 1.7–2.4)

## 2022-03-11 LAB — COMPREHENSIVE METABOLIC PANEL
ALT: 30 U/L (ref 0–44)
AST: 26 U/L (ref 15–41)
Albumin: 2.1 g/dL — ABNORMAL LOW (ref 3.5–5.0)
Alkaline Phosphatase: 63 U/L (ref 38–126)
Anion gap: 11 (ref 5–15)
BUN: 35 mg/dL — ABNORMAL HIGH (ref 8–23)
CO2: 20 mmol/L — ABNORMAL LOW (ref 22–32)
Calcium: 8.5 mg/dL — ABNORMAL LOW (ref 8.9–10.3)
Chloride: 104 mmol/L (ref 98–111)
Creatinine, Ser: 1.71 mg/dL — ABNORMAL HIGH (ref 0.61–1.24)
GFR, Estimated: 44 mL/min — ABNORMAL LOW (ref >60)
Glucose, Bld: 126 mg/dL — ABNORMAL HIGH (ref 70–99)
Potassium: 4 mmol/L (ref 3.5–5.1)
Sodium: 135 mmol/L (ref 135–145)
Total Bilirubin: 0.6 mg/dL (ref 0.3–1.2)
Total Protein: 5.9 g/dL — ABNORMAL LOW (ref 6.5–8.1)

## 2022-03-11 LAB — PHOSPHORUS: Phosphorus: 1.7 mg/dL — ABNORMAL LOW (ref 2.5–4.6)

## 2022-03-11 LAB — HEMOGLOBIN A1C
Hgb A1c MFr Bld: 7.3 % — ABNORMAL HIGH (ref 4.8–5.6)
Mean Plasma Glucose: 162.81 mg/dL

## 2022-03-11 LAB — VITAMIN D 25 HYDROXY (VIT D DEFICIENCY, FRACTURES): Vit D, 25-Hydroxy: 75.05 ng/mL (ref 30–100)

## 2022-03-11 LAB — PREALBUMIN: Prealbumin: 6.5 mg/dL — ABNORMAL LOW (ref 18–38)

## 2022-03-11 SURGERY — IRRIGATION AND DEBRIDEMENT EXTREMITY
Anesthesia: General | Site: Knee | Laterality: Right

## 2022-03-11 MED ORDER — OXYCODONE HCL 5 MG/5ML PO SOLN: 5.0000 mg | Freq: Once | ORAL | Status: DC | PRN

## 2022-03-11 MED ORDER — CHLORHEXIDINE GLUCONATE 0.12 % MT SOLN
15.0000 mL | OROMUCOSAL | Status: DC
  Filled 2022-03-11: qty 15

## 2022-03-11 MED ORDER — ONDANSETRON HCL 4 MG/2ML IJ SOLN: 4.0000 mg | Freq: Four times a day (QID) | INTRAMUSCULAR | Status: DC | PRN

## 2022-03-11 MED ORDER — AMISULPRIDE (ANTIEMETIC) 5 MG/2ML IV SOLN: 10.0000 mg | Freq: Once | INTRAVENOUS | Status: DC | PRN

## 2022-03-11 MED ORDER — ALUM & MAG HYDROXIDE-SIMETH 200-200-20 MG/5ML PO SUSP: 15.0000 mL | ORAL | Status: DC | PRN

## 2022-03-11 MED ORDER — HYDRALAZINE HCL 20 MG/ML IJ SOLN: 5.0000 mg | INTRAMUSCULAR | Status: DC | PRN

## 2022-03-11 MED ORDER — PANTOPRAZOLE SODIUM 40 MG PO TBEC
40.0000 mg | DELAYED_RELEASE_TABLET | Freq: Every day | ORAL | Status: DC
  Administered 2022-03-11 – 2022-03-21 (×11): 40 mg via ORAL
  Filled 2022-03-11 (×11): qty 1

## 2022-03-11 MED ORDER — DOCUSATE SODIUM 100 MG PO CAPS
100.0000 mg | ORAL_CAPSULE | Freq: Every day | ORAL | Status: DC
  Administered 2022-03-12 – 2022-03-17 (×6): 100 mg via ORAL
  Filled 2022-03-11 (×7): qty 1

## 2022-03-11 MED ORDER — PHENYLEPHRINE 80 MCG/ML (10ML) SYRINGE FOR IV PUSH (FOR BLOOD PRESSURE SUPPORT)
PREFILLED_SYRINGE | INTRAVENOUS | Status: DC | PRN
  Administered 2022-03-11 (×5): 80 ug via INTRAVENOUS

## 2022-03-11 MED ORDER — ORAL CARE MOUTH RINSE: 15.0000 mL | OROMUCOSAL | Status: DC

## 2022-03-11 MED ORDER — MIDAZOLAM HCL 2 MG/2ML IJ SOLN
INTRAMUSCULAR | Status: AC
  Administered 2022-03-11: 2 mg via INTRAVENOUS
  Filled 2022-03-11: qty 2

## 2022-03-11 MED ORDER — FENTANYL CITRATE (PF) 100 MCG/2ML IJ SOLN
INTRAMUSCULAR | Status: AC
  Administered 2022-03-11: 50 ug via INTRAVENOUS
  Filled 2022-03-11: qty 2

## 2022-03-11 MED ORDER — ACETAMINOPHEN 10 MG/ML IV SOLN: 1000.0000 mg | Freq: Once | INTRAVENOUS | Status: DC | PRN

## 2022-03-11 MED ORDER — ACETAMINOPHEN 325 MG PO TABS: 325.0000 mg | ORAL_TABLET | Freq: Four times a day (QID) | ORAL | Status: DC | PRN

## 2022-03-11 MED ORDER — TRANEXAMIC ACID-NACL 1000-0.7 MG/100ML-% IV SOLN
1000.0000 mg | INTRAVENOUS | Status: AC
  Administered 2022-03-11: 1000 mg via INTRAVENOUS
  Filled 2022-03-11: qty 100

## 2022-03-11 MED ORDER — PHENOL 1.4 % MT LIQD: 1.0000 | OROMUCOSAL | Status: DC | PRN

## 2022-03-11 MED ORDER — DEXAMETHASONE SODIUM PHOSPHATE 10 MG/ML IJ SOLN
INTRAMUSCULAR | Status: DC | PRN
  Administered 2022-03-11: 4 mg via INTRAVENOUS

## 2022-03-11 MED ORDER — ZINC SULFATE 220 (50 ZN) MG PO CAPS
220.0000 mg | ORAL_CAPSULE | Freq: Every day | ORAL | Status: DC
  Administered 2022-03-11 – 2022-03-21 (×10): 220 mg via ORAL
  Filled 2022-03-11 (×10): qty 1

## 2022-03-11 MED ORDER — CHLORHEXIDINE GLUCONATE 4 % EX LIQD
60.0000 mL | Freq: Once | CUTANEOUS | Status: AC
  Administered 2022-03-11: 4 via TOPICAL
  Filled 2022-03-11: qty 60

## 2022-03-11 MED ORDER — LACTATED RINGERS IV SOLN: INTRAVENOUS | Status: DC

## 2022-03-11 MED ORDER — HYDROMORPHONE HCL 2 MG PO TABS
2.0000 mg | ORAL_TABLET | ORAL | Status: DC | PRN
  Administered 2022-03-11 – 2022-03-12 (×2): 2 mg via ORAL
  Filled 2022-03-11 (×2): qty 1

## 2022-03-11 MED ORDER — FENTANYL CITRATE (PF) 100 MCG/2ML IJ SOLN: 50.0000 ug | Freq: Once | INTRAMUSCULAR | Status: AC

## 2022-03-11 MED ORDER — PROPOFOL 10 MG/ML IV BOLUS
INTRAVENOUS | Status: DC | PRN
  Administered 2022-03-11: 40 mg via INTRAVENOUS
  Administered 2022-03-11: 20 mg via INTRAVENOUS
  Administered 2022-03-11: 40 mg via INTRAVENOUS

## 2022-03-11 MED ORDER — POVIDONE-IODINE 10 % EX SWAB: 2.0000 "application " | Freq: Once | CUTANEOUS | Status: DC

## 2022-03-11 MED ORDER — VANCOMYCIN HCL 1250 MG/250ML IV SOLN
1250.0000 mg | INTRAVENOUS | Status: DC
  Administered 2022-03-11: 1250 mg via INTRAVENOUS
  Filled 2022-03-11: qty 250

## 2022-03-11 MED ORDER — DEXAMETHASONE SODIUM PHOSPHATE 10 MG/ML IJ SOLN
INTRAMUSCULAR | Status: AC
  Filled 2022-03-11: qty 1

## 2022-03-11 MED ORDER — JUVEN PO PACK
1.0000 | PACK | Freq: Two times a day (BID) | ORAL | Status: DC
  Administered 2022-03-12 – 2022-03-21 (×18): 1 via ORAL
  Filled 2022-03-11 (×21): qty 1

## 2022-03-11 MED ORDER — ETOMIDATE 2 MG/ML IV SOLN
INTRAVENOUS | Status: AC
  Filled 2022-03-11: qty 10

## 2022-03-11 MED ORDER — MIDAZOLAM HCL 2 MG/2ML IJ SOLN: 2.0000 mg | Freq: Once | INTRAMUSCULAR | Status: AC

## 2022-03-11 MED ORDER — METOPROLOL TARTRATE 5 MG/5ML IV SOLN: 2.0000 mg | INTRAVENOUS | Status: DC | PRN

## 2022-03-11 MED ORDER — HYDROMORPHONE HCL 1 MG/ML IJ SOLN: 0.5000 mg | INTRAMUSCULAR | Status: DC | PRN

## 2022-03-11 MED ORDER — NOREPINEPHRINE 4 MG/250ML-% IV SOLN
2.0000 ug/min | INTRAVENOUS | Status: DC
  Administered 2022-03-12: 3 ug/min via INTRAVENOUS
  Filled 2022-03-11: qty 250

## 2022-03-11 MED ORDER — MAGNESIUM CITRATE PO SOLN
1.0000 | Freq: Once | ORAL | Status: DC | PRN
  Filled 2022-03-11: qty 296

## 2022-03-11 MED ORDER — ASCORBIC ACID 500 MG PO TABS
1000.0000 mg | ORAL_TABLET | Freq: Every day | ORAL | Status: DC
  Administered 2022-03-11 – 2022-03-21 (×11): 1000 mg via ORAL
  Filled 2022-03-11 (×11): qty 2

## 2022-03-11 MED ORDER — LIDOCAINE 2% (20 MG/ML) 5 ML SYRINGE
INTRAMUSCULAR | Status: DC | PRN
  Administered 2022-03-11: 40 mg via INTRAVENOUS

## 2022-03-11 MED ORDER — ORAL CARE MOUTH RINSE
15.0000 mL | Freq: Two times a day (BID) | OROMUCOSAL | Status: DC
  Administered 2022-03-11 – 2022-03-20 (×14): 15 mL via OROMUCOSAL

## 2022-03-11 MED ORDER — FENTANYL CITRATE (PF) 100 MCG/2ML IJ SOLN: 25.0000 ug | INTRAMUSCULAR | Status: DC | PRN

## 2022-03-11 MED ORDER — POTASSIUM CHLORIDE CRYS ER 20 MEQ PO TBCR: 20.0000 meq | EXTENDED_RELEASE_TABLET | Freq: Every day | ORAL | Status: DC | PRN

## 2022-03-11 MED ORDER — BISACODYL 5 MG PO TBEC: 5.0000 mg | DELAYED_RELEASE_TABLET | Freq: Every day | ORAL | Status: DC | PRN

## 2022-03-11 MED ORDER — ONDANSETRON HCL 4 MG/2ML IJ SOLN
INTRAMUSCULAR | Status: AC
  Filled 2022-03-11: qty 2

## 2022-03-11 MED ORDER — MAGNESIUM SULFATE 2 GM/50ML IV SOLN: 2.0000 g | Freq: Every day | INTRAVENOUS | Status: DC | PRN

## 2022-03-11 MED ORDER — LABETALOL HCL 5 MG/ML IV SOLN: 10.0000 mg | INTRAVENOUS | Status: DC | PRN

## 2022-03-11 MED ORDER — BUPIVACAINE HCL (PF) 0.5 % IJ SOLN
INTRAMUSCULAR | Status: DC | PRN
  Administered 2022-03-11: 15 mL via PERINEURAL

## 2022-03-11 MED ORDER — SODIUM PHOSPHATES 45 MMOLE/15ML IV SOLN
30.0000 mmol | Freq: Once | INTRAVENOUS | Status: DC
  Filled 2022-03-11: qty 10

## 2022-03-11 MED ORDER — BUPIVACAINE LIPOSOME 1.3 % IJ SUSP
INTRAMUSCULAR | Status: DC | PRN
  Administered 2022-03-11: 10 mL via PERINEURAL

## 2022-03-11 MED ORDER — ACETAMINOPHEN 325 MG PO TABS: 325.0000 mg | ORAL_TABLET | ORAL | Status: DC | PRN

## 2022-03-11 MED ORDER — FENTANYL CITRATE (PF) 250 MCG/5ML IJ SOLN
INTRAMUSCULAR | Status: AC
  Filled 2022-03-11: qty 5

## 2022-03-11 MED ORDER — SODIUM PHOSPHATES 45 MMOLE/15ML IV SOLN
30.0000 mmol | Freq: Once | INTRAVENOUS | Status: AC
  Administered 2022-03-11: 30 mmol via INTRAVENOUS
  Filled 2022-03-11: qty 10

## 2022-03-11 MED ORDER — ACETAMINOPHEN 160 MG/5ML PO SOLN: 325.0000 mg | ORAL | Status: DC | PRN

## 2022-03-11 MED ORDER — CHLORHEXIDINE GLUCONATE 0.12 % MT SOLN
15.0000 mL | OROMUCOSAL | Status: AC
Start: 2022-03-11 — End: 2022-03-11
  Administered 2022-03-11: 15 mL via OROMUCOSAL

## 2022-03-11 MED ORDER — GUAIFENESIN-DM 100-10 MG/5ML PO SYRP: 15.0000 mL | ORAL_SOLUTION | ORAL | Status: DC | PRN

## 2022-03-11 MED ORDER — ROPIVACAINE HCL 5 MG/ML IJ SOLN
INTRAMUSCULAR | Status: DC | PRN
  Administered 2022-03-11: 20 mL via PERINEURAL

## 2022-03-11 MED ORDER — CEFAZOLIN IN SODIUM CHLORIDE 3-0.9 GM/100ML-% IV SOLN
3.0000 g | INTRAVENOUS | Status: AC
  Administered 2022-03-11: 3 g via INTRAVENOUS
  Filled 2022-03-11: qty 100

## 2022-03-11 MED ORDER — 0.9 % SODIUM CHLORIDE (POUR BTL) OPTIME
TOPICAL | Status: DC | PRN
  Administered 2022-03-11: 1000 mL

## 2022-03-11 MED ORDER — POLYETHYLENE GLYCOL 3350 17 G PO PACK
17.0000 g | PACK | Freq: Every day | ORAL | Status: DC | PRN
  Administered 2022-03-16: 17 g via ORAL
  Filled 2022-03-11: qty 1

## 2022-03-11 MED ORDER — ALBUMIN HUMAN 5 % IV SOLN: INTRAVENOUS | Status: DC | PRN

## 2022-03-11 MED ORDER — CHLORHEXIDINE GLUCONATE 0.12% ORAL RINSE (MEDLINE KIT): 15.0000 mL | Freq: Two times a day (BID) | OROMUCOSAL | Status: DC

## 2022-03-11 MED ORDER — CEFAZOLIN SODIUM-DEXTROSE 2-4 GM/100ML-% IV SOLN
2.0000 g | Freq: Three times a day (TID) | INTRAVENOUS | Status: DC
  Filled 2022-03-11 (×2): qty 100

## 2022-03-11 MED ORDER — MIDAZOLAM HCL 2 MG/2ML IJ SOLN
INTRAMUSCULAR | Status: AC
  Filled 2022-03-11: qty 2

## 2022-03-11 MED ORDER — SODIUM CHLORIDE 0.9 % IV SOLN: INTRAVENOUS | Status: DC

## 2022-03-11 MED ORDER — ONDANSETRON HCL 4 MG/2ML IJ SOLN
INTRAMUSCULAR | Status: DC | PRN
  Administered 2022-03-11: 4 mg via INTRAVENOUS

## 2022-03-11 MED ORDER — OXYCODONE HCL 5 MG PO TABS
5.0000 mg | ORAL_TABLET | ORAL | Status: DC | PRN
  Administered 2022-03-11: 5 mg via ORAL
  Administered 2022-03-12 (×3): 10 mg via ORAL
  Administered 2022-03-13 (×2): 5 mg via ORAL
  Administered 2022-03-13: 10 mg via ORAL
  Administered 2022-03-14: 5 mg via ORAL
  Administered 2022-03-14 – 2022-03-20 (×6): 10 mg via ORAL
  Filled 2022-03-11 (×2): qty 1
  Filled 2022-03-11 (×6): qty 2
  Filled 2022-03-11: qty 1
  Filled 2022-03-11 (×2): qty 2
  Filled 2022-03-11: qty 1
  Filled 2022-03-11: qty 2
  Filled 2022-03-11: qty 1
  Filled 2022-03-11: qty 2

## 2022-03-11 MED ORDER — OXYCODONE HCL 5 MG PO TABS: 5.0000 mg | ORAL_TABLET | Freq: Once | ORAL | Status: DC | PRN

## 2022-03-11 SURGICAL SUPPLY — 51 items
BAG COUNTER SPONGE SURGICOUNT (BAG) IMPLANT
BAG SPNG CNTER NS LX DISP (BAG)
BLADE SAW RECIP 87.9 MT (BLADE) ×3 IMPLANT
BLADE SURG 21 STRL SS (BLADE) ×3 IMPLANT
BNDG COHESIVE 6X5 TAN STRL LF (GAUZE/BANDAGES/DRESSINGS) IMPLANT
BNDG GAUZE ELAST 4 BULKY (GAUZE/BANDAGES/DRESSINGS) ×6 IMPLANT
CANISTER PREVENA PLUS 150 (CANNISTER) ×1 IMPLANT
CANISTER WOUND CARE 500ML ATS (WOUND CARE) ×3 IMPLANT
COVER SURGICAL LIGHT HANDLE (MISCELLANEOUS) ×6 IMPLANT
CUFF TOURN SGL QUICK 34 (TOURNIQUET CUFF) ×3
CUFF TRNQT CYL 34X4.125X (TOURNIQUET CUFF) ×2 IMPLANT
DRAPE DERMATAC (DRAPES) ×2 IMPLANT
DRAPE INCISE IOBAN 66X45 STRL (DRAPES) ×3 IMPLANT
DRAPE U-SHAPE 47X51 STRL (DRAPES) ×3 IMPLANT
DRESSING PREVENA PLUS CUSTOM (GAUZE/BANDAGES/DRESSINGS) ×2 IMPLANT
DRESSING RESTOR ADAPTIFORM 49 (GAUZE/BANDAGES/DRESSINGS) ×1 IMPLANT
DRSG ADAPTIC 3X8 NADH LF (GAUZE/BANDAGES/DRESSINGS) ×3 IMPLANT
DRSG PREVENA PLUS CUSTOM (GAUZE/BANDAGES/DRESSINGS) ×3
DRSG RESTOR ADAPTIFORM 49 (GAUZE/BANDAGES/DRESSINGS) ×2
DURAPREP 26ML APPLICATOR (WOUND CARE) ×3 IMPLANT
ELECT REM PT RETURN 9FT ADLT (ELECTROSURGICAL) ×3
ELECTRODE REM PT RTRN 9FT ADLT (ELECTROSURGICAL) ×2 IMPLANT
GAUZE SPONGE 4X4 12PLY STRL (GAUZE/BANDAGES/DRESSINGS) ×3 IMPLANT
GLOVE BIOGEL PI IND STRL 9 (GLOVE) ×2 IMPLANT
GLOVE BIOGEL PI INDICATOR 9 (GLOVE) ×1
GLOVE SURG ORTHO 9.0 STRL STRW (GLOVE) ×3 IMPLANT
GOWN STRL REUS W/ TWL XL LVL3 (GOWN DISPOSABLE) ×4 IMPLANT
GOWN STRL REUS W/TWL XL LVL3 (GOWN DISPOSABLE) ×6
GRAFT SKIN WND OMEGA3 SB 7X10 (Tissue) ×1 IMPLANT
HANDPIECE INTERPULSE COAX TIP (DISPOSABLE)
KIT BASIN OR (CUSTOM PROCEDURE TRAY) ×3 IMPLANT
KIT TURNOVER KIT B (KITS) ×3 IMPLANT
MANIFOLD NEPTUNE II (INSTRUMENTS) ×3 IMPLANT
NS IRRIG 1000ML POUR BTL (IV SOLUTION) ×3 IMPLANT
PACK ORTHO EXTREMITY (CUSTOM PROCEDURE TRAY) ×3 IMPLANT
PAD ARMBOARD 7.5X6 YLW CONV (MISCELLANEOUS) ×6 IMPLANT
PREVENA RESTOR ARTHOFORM 46X30 (CANNISTER) ×3 IMPLANT
SET HNDPC FAN SPRY TIP SCT (DISPOSABLE) IMPLANT
SPONGE T-LAP 18X18 ~~LOC~~+RFID (SPONGE) IMPLANT
STAPLER VISISTAT 35W (STAPLE) IMPLANT
STOCKINETTE IMPERVIOUS 9X36 MD (GAUZE/BANDAGES/DRESSINGS) IMPLANT
STOCKINETTE IMPERVIOUS LG (DRAPES) ×3 IMPLANT
SUT ETHILON 2 0 PSLX (SUTURE) ×4 IMPLANT
SUT SILK 2 0 (SUTURE) ×3
SUT SILK 2-0 18XBRD TIE 12 (SUTURE) ×2 IMPLANT
SUT VIC AB 1 CTX 27 (SUTURE) ×6 IMPLANT
SWAB COLLECTION DEVICE MRSA (MISCELLANEOUS) ×3 IMPLANT
SWAB CULTURE ESWAB REG 1ML (MISCELLANEOUS) IMPLANT
TOWEL GREEN STERILE (TOWEL DISPOSABLE) ×3 IMPLANT
TUBE CONNECTING 12X1/4 (SUCTIONS) ×3 IMPLANT
YANKAUER SUCT BULB TIP NO VENT (SUCTIONS) ×3 IMPLANT

## 2022-03-11 NOTE — Transfer of Care (Signed)
Immediate Anesthesia Transfer of Care Note ? ?Patient: Eduardo [name] ? ?Procedure(s) Performed: BELOW KNEE AMPUTATION (Right) ?RIGHT LEG DEBRIDEMENT VS. BELOW KNEE AMPUTATION (Right: Knee) ? ?Patient Location: PACU ? ?Anesthesia Type:General and Regional ? ?Level of Consciousness: drowsy and patient cooperative ? ?Airway & Oxygen Therapy: Patient Spontanous Breathing and Patient connected to face mask oxygen ? ?Post-op Assessment: Report given to RN and Post -op Vital signs reviewed and stable ? ?Post vital signs: Reviewed and stable ? ?Last Vitals:  ?Vitals Value Taken Time  ?BP 90/60 03/11/22 1740  ?Temp    ?Pulse 69 03/11/22 1742  ?Resp 14 03/11/22 1742  ?SpO2 97 % 03/11/22 1742  ?Vitals shown include unvalidated device data. ? ?Last Pain:  ?Vitals:  ? 03/11/22 1120  ?TempSrc: Oral  ?PainSc:   ?   ? ?  ? ?Complications: No notable events documented. ?

## 2022-03-11 NOTE — Progress Notes (Signed)
Patient transported from MRI back to 3M07 von BIPAP. No issues. ?

## 2022-03-11 NOTE — Progress Notes (Addendum)
Patient transported to MRI. Placed on BIPAP prior to scan.  ?

## 2022-03-11 NOTE — Progress Notes (Signed)
Inpatient Diabetes Program Recommendations ? ?AACE/ADA: New Consensus Statement on Inpatient Glycemic Control  ? ?Target Ranges:  Prepandial:   less than 140 mg/dL ?     Peak postprandial:   less than 180 mg/dL (1-2 hours) ?     Critically ill patients:  140 - 180 mg/dL  ? ? Latest Reference Range & Units 03/11/22 01:24 03/11/22 03:06 03/11/22 07:13 03/11/22 11:19  ?Glucose-Capillary 70 - 99 mg/dL 116 (H) ? ?IV insulin discontinued 109 (H) 129 (H) ? ?Novolog 2 units '@10'$ :55 ? ?Levemir 5 units '@10'$ :49 121 (H)  ? ? Latest Reference Range & Units 03/10/22 19:48 03/10/22 20:52 03/10/22 23:26 03/11/22 00:32  ?Glucose-Capillary 70 - 99 mg/dL 112 (H) 98 106 (H) ? ?Levemir 5 units '@23'$ :27 131 (H) ?  ? ? Latest Reference Range & Units 03/10/22 ?07:41 03/10/22 09:54 03/10/22 11:12 03/10/22 14:39 03/10/22 16:29 03/10/22 17:35 03/10/22 18:47  ?Glucose-Capillary 70 - 99 mg/dL 390 (H) ? ?Novolog 20 units '@7'$ :45 391 (H) ? ?Novolog 20 units '@10'$ :05 ? ?Semglee 40 units '@10'$ :08 363 (H) ? ?Novolog 20 units '@12'$ :24 342 (H) ? ?Started on IV insulin '@15'$ :22 237 (H) 153 (H) 120 (H)  ? ?Review of Glycemic Control ? ?Diabetes history: DM2 ?Outpatient Diabetes medications: Humulin N 30 units QAM, Humulin N 15 units QPM, Humalog 0-10 units TID with meals ?Current orders for Inpatient glycemic control: Levemir 5 units Q12H, Novolog 2-6 units Q4H ? ? ?NOTE: Was started on IV insulin on 03/10/22 at 15:22. Noted patient received Semglee 40 units at 10:08 on 5/2 (prior to IV insulin being started).  Patient is currently ordered ICU Glycemic Control order set. Question if glucose will become more elevated since only ordered Levemir 5 units Q12H. Agree with ICU Glycemic order set as nurse can transition to Phase 2 IV insulin per protocol if glucose is consistently elevated. Will follow. ? ?Thanks, ?Barnie Alderman, RN, MSN, CDE ?Diabetes Coordinator ?Inpatient Diabetes Program ?[phone] (Team Pager from 8am to 5pm) ? ? ?

## 2022-03-11 NOTE — Anesthesia Preprocedure Evaluation (Addendum)
Anesthesia Evaluation  ?Patient identified by MRN, date of birth, ID band ?Patient awake ? ? ? ?Reviewed: ?Allergy & Precautions, NPO status , Patient's Chart, lab work & pertinent test results ? ?Airway ?Mallampati: III ? ?TM Distance: >3 FB ?Neck ROM: Full ? ? ? Dental ? ?(+) Teeth Intact, Dental Advisory Given ?  ?Pulmonary ?former smoker,  ?  ?breath sounds clear to auscultation ? ? ? ? ? ? Cardiovascular ?hypertension, + CAD, + Peripheral Vascular Disease and +CHF  ? ?Rhythm:Regular Rate:Normal ? ?Echo: ??1. Left ventricular ejection fraction, by estimation, is <20%. The left  ?ventricle has severely decreased function. The left ventricle demonstrates  ?regional wall motion abnormalities (see scoring diagram/findings for  ?description). The left ventricular  ?internal cavity size was moderately dilated. There is mild left  ?ventricular hypertrophy. Left ventricular diastolic parameters are  ?consistent with Grade II diastolic dysfunction (pseudonormalization).  ?Elevated left atrial pressure. Akinetic inferolateral  ??and apical myocardium. Moderate anteroseptal hypokinesis.  ??2. Right ventricular systolic function is low normal. The right  ?ventricular size is normal.  ??3. The mitral valve is grossly normal. Mild to moderate mitral valve  ?regurgitation.  ??4. The aortic valve is tricuspid. Aortic valve regurgitation is not  ?visualized.  ??5. The inferior vena cava is dilated in size with <50% respiratory  ?variability, suggesting right atrial pressure of 15 mmHg.  ??6. Compared to previous study in 10/2020, LVEF has further decreased from  ?20-25% to 15-20%.  ?  ?Neuro/Psych ?negative psych ROS  ? GI/Hepatic ?negative GI ROS, Neg liver ROS,   ?Endo/Other  ?diabetes ? Renal/GU ?Renal disease  ? ?  ?Musculoskeletal ?negative musculoskeletal ROS ?(+)  ? Abdominal ?(+) + obese,   ?Peds ? Hematology ?negative hematology ROS ?(+)   ?Anesthesia Other Findings ? ?  Reproductive/Obstetrics ? ?  ? ? ? ? ? ? ? ? ? ? ? ? ? ?  ?  ? ? ? ? ? ? ? ?Anesthesia Physical ?Anesthesia Plan ? ?ASA: 4 ? ?Anesthesia Plan: Regional  ? ?Post-op Pain Management:   ? ?Induction: Intravenous ? ?PONV Risk Score and Plan: 3 and Ondansetron, Dexamethasone and Midazolam ? ?Airway Management Planned: Natural Airway and Simple Face Mask ? ?Additional Equipment: None ? ?Intra-op Plan:  ? ?Post-operative Plan:  ? ?Informed Consent: I have reviewed the patients History and Physical, chart, labs and discussed the procedure including the risks, benefits and alternatives for the proposed anesthesia with the patient or authorized representative who has indicated his/her understanding and acceptance.  ? ? ? ?Dental advisory given ? ?Plan Discussed with: CRNA ? ?Anesthesia Plan Comments:   ? ? ? ? ? ?Anesthesia Quick Evaluation ? ?

## 2022-03-11 NOTE — Progress Notes (Signed)
? ?NAME:  Eduardo [name], MRN:  [id number], DOB:  [birth year], LOS: 3 ?ADMISSION DATE:  03/08/2022, CONSULTATION DATE:  4/30 ?REFERRING MD:  Francee Piccolo, CHIEF COMPLAINT:  Dyspea  ? ?History of Present Illness:  ?66 y/o male presented to the [facility] ER complaining of dyspnea, found to be in septic shock due to osteomyelitis of his right foot.   ? ?He has a complex past medical history and follows with [facility] for a complicated cardiac history which includes coronary artery disease status post triple-vessel CABG in 2017, hypertension and hyperlipidemia.  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 Sharol Given in the past.  He has a history of peripheral vascular disease and has a history of right superficial femoral artery stenting in 2020, he takes Plavix for that.  He also takes amiodarone for frequent PVCs in the setting of coronary disease and systolic heart failure. ? ?The patient tells me that over the last week he has been feeling poorly.  He has been having fevers off and on with associated chills and generalized weakness.  He threw up one time.  He denies nausea, vomiting or diarrhea.  No abdominal pain or rash.  However, his right foot wound has been swelling and draining more and has been foul-smelling for the last week.  He has not been on any antibiotics.  He said that today he started to feel more weak and was struggling to walk around in his house.  He thought that he may be having a heart attack given his extensive cardiac history so he took a nitroglycerin tablet.  At this point because he was so weak and unable to move his girlfriend called 911 and he was brought into the emergency department by EMS.  There he was recognized to have septic shock and has been given IV antibiotics and 30 cc per keg of crystalloid.  Despite that the patient was still hypotensive so he was started on peripheral Levophed. ? ?The patient says that the chronic  wound has been addressed in the wound center here in town with hyperbaric therapy and wound management.  However, it has been quite sometime since he saw them. ? ?The patient tells me that at baseline he has relatively low blood pressure and his systolic blood pressure typically runs in the 90s. ? ?Pertinent  Medical History  ?CAD status post CABG in 2017 ?Peripheral arterial disease status post stenting of right superficial femoral artery in 2020 ?Diabetes mellitus type 2 ?Hypertension ?Hyperlipidemia ?Systolic heart failure secondary to ischemic cardiomyopathy February 2022 echocardiogram LVEF less than 20% with severely decreased function.  RV systolic function is low normal, mild to moderate mitral valve regurgitation ?Osteomyelitis of right foot, chronic wound treated in the wound center with hyperbaric therapy and status post partial amputation by Dr. Sharol Given in the past. ? ?Significant Hospital Events: ?Including procedures, antibiotic start and stop dates in addition to other pertinent events   ?4/30  Admitted for septic shock secondary to right foot osteomyelitis, fifth metatarsal destruction noted on foot x-ray, orthopedic surgery consulted (Dr. Sharol Given service) ?5/1 Went to MRI this am and developed respiratory distress > unable to complete due to SOB in setting of cough - tried to sit up to cough and became acutely more SOB.  Returned to ICU and placed on BiPAP. Changed to LCB ?5/2 remains on 4 mcg levophed, glucose elevated (received d5w with electrolyte replacement), insulin drip started ?5/3 insulin  drip, NE drip stopped overnight,  ? ?Interim History / Subjective:  ?Afebrile / Cr improved ?Glucose better, insulin drip stopped ?NE off ?Plan for OR this PM ? ?Objective   ?Blood pressure (!) 89/59, pulse 86, temperature 97.8 ?F (36.6 ?C), temperature source Oral, resp. rate 16, height '5\' 9"'$  (1.753 m), weight 130.4 kg, SpO2 96 %. ?   ?   ? ?Intake/Output Summary (Last 24 hours) at 03/11/2022 1400 ?Last data  filed at 03/11/2022 1200 ?Gross per 24 hour  ?Intake 1590.62 ml  ?Output 750 ml  ?Net 840.62 ml  ? ? ?Filed Weights  ? 03/08/22 1355 03/09/22 0449 03/11/22 0515  ?Weight: 124.3 kg 124.3 kg 130.4 kg  ? ? ?Examination: ?General: pleasant adult male sitting up in bed in NAD ?HEENT: MM pink/moist, anicteric ?Neuro: AAOx4, speech clear, MAE ?CV: s1s2 RRR, wide QRS but SR, no m/r/g ?PULM: non-labored at rest, lungs bilaterally clear anterior, diminished bases ?GI: soft, bsx4 active  ?Extremities: warm/dry, no overt edema  ?Skin: no rashes on exposed skin. R foot wrapped with known foot wound  ? ?Resolved Hospital Problem list   ? ? ?Assessment & Plan:  ? ?Septic Shock secondary to Right Foot Cellulitis / Osteomyelitis and Strep Bacteremia  ?Baseline SBP 90 ?-hold home entresto, diuresis  ?-continue cefepime, vancomycin until source control , narrow to CTX post-op ?-appreciate orthopedics assistance with patient care ?-MRI with osteo ?-TEE during OR time planned ? ?Chronic systolic heart failure, currently not in exacerbation ?-hold home medications for now with hypotension ?-follow volume status closely ? ?Coronary artery disease, PVD ?Peripheral vascular disease status post stenting of superficial femoral artery in past ?-hold home plavix with impending surgery  ?-continue zetia, statin ? ?Diabetes Mellitus Type 2 with Hyperglycemia  ?-hold home GLP-1  ?-s/p insulin drip PM 5/2 ?- decreased lantus given NPO, suspect will need to increase post-op ? ?AKI: Improved, approaching baseline ?-Trend BMP / urinary output ?-Replace electrolytes as indicated ?-Avoid nephrotoxic agents, ensure adequate renal perfusion ? ?Hypophosphatemia  ?-replace as indicated  ? ?Best Practice (right click and "Reselect all SmartList Selections" daily)  ?Diet/type: Regular consistency (see orders) ?DVT prophylaxis: prophylactic heparin  ?GI prophylaxis: N/A ?Lines: N/A ?Foley:  N/A ?Code Status:  full code ?Last date of multidisciplinary goals of  care discussion:  5/1 further clarified with patient, he does not want CPR in the event of arrest and under "normal circumstances is a DNR" but is hopeful his foot issue can be managed. He would be accepting of short term intubation if necessary but would not want long term mechanical ventilation or trach. Code status to LCB. ? ?Critical care time: n/a  ? ?Lanier Clam, MD ?[facility] ?03/11/2022, 2:00 PM ? ?Please see [email or web address] for contact info ?From 7A-7P if no response, please call [phone] ?After hours, please call Warren Lacy [phone] ? ? ?

## 2022-03-11 NOTE — Progress Notes (Signed)
?   03/11/22 1355  ?Clinical Encounter Type  ?Visited With Patient;Family  ?Visit Type Initial  ?Referral From Nurse  ?Consult/Referral To Chaplain  ? ?Chaplain responded to page from patient who had requested a notary. Patient had several legal documents which required a notary and Chaplain spoke with Junie Panning in [place] to determine whether she could notarize all documents. Patient and significant other was informed by both Junie Panning and Chaplain that only the HPOA and Living Will could be notarized per hospital guidelines. Patient indicated that he would contact mobile notary so that all documents could be executed at the same time.  ? ?Melody Haver, Resident Chaplain ?[phone] ?

## 2022-03-11 NOTE — Anesthesia Procedure Notes (Signed)
Anesthesia Regional Block: Adductor canal block  ? ?Pre-Anesthetic Checklist: , timeout performed,  Correct Patient, Correct Site, Correct Laterality,  Correct Procedure, Correct Position, site marked,  Risks and benefits discussed,  Surgical consent,  Pre-op evaluation,  At surgeon's request and post-op pain management ? ?Laterality: Right ? ?Prep: chloraprep     ?  ?Needles:  ?Injection technique: Single-shot ? ?Needle Type: Echogenic Stimulator Needle   ? ? ?Needle Length: 9cm  ?Needle Gauge: 21  ? ? ? ?Additional Needles: ? ? ?Procedures:,,,, ultrasound used (permanent image in chart),,    ?Narrative:  ?Start time: 03/11/2022 3:10 PM ?End time: 03/11/2022 3:15 PM ?Injection made incrementally with aspirations every 5 mL. ? ?Performed by: Personally  ?Anesthesiologist: Effie Berkshire, MD ? ?Additional Notes: ?Patient tolerated the procedure well. Local anesthetic introduced in an incremental fashion under minimal resistance after negative aspirations. No paresthesias were elicited. After completion of the procedure, no acute issues were identified and patient continued to be monitored by RN.  ? ? ? ? ? ?

## 2022-03-11 NOTE — Interval H&P Note (Signed)
History and Physical Interval Note: ? ?03/11/2022 ?7:09 AM ? ?Eduardo [name]  has presented today for surgery, with the diagnosis of Abscess Right Ankle.  The various methods of treatment have been discussed with the patient and family. After consideration of risks, benefits and other options for treatment, the patient has consented to  Procedure(s): ?RIGHT LEG DEBRIDEMENT VS. BELOW KNEE AMPUTATION (Right) ?RIGHT LEG DEBRIDEMENT VS. BELOW KNEE AMPUTATION (Right) as a surgical intervention.  The patient's history has been reviewed, patient examined, no change in status, stable for surgery.  I have reviewed the patient's chart and labs.  Questions were answered to the patient's satisfaction.   ? ? ?Newt Minion ? ? ?

## 2022-03-11 NOTE — Progress Notes (Signed)
[facility] ADULT ICU REPLACEMENT PROTOCOL ? ? ?The patient does apply for the [facility] Adult ICU Electrolyte Replacment Protocol based on the criteria listed below:  ? ?1.Exclusion criteria: TCTS patients, ECMO patients, and Dialysis patients ?2. Is GFR >/= 30 ml/min? Yes.    ?Patient's GFR today is 44 ?3. Is SCr </= 2? Yes.   ?Patient's SCr is 1.71 mg/dL ?4. Did SCr increase >/= 0.5 in 24 hours? No. ?5.Pt's weight >40kg  Yes.   ?6. Abnormal electrolyte(s): phos 1.7  ?7. Electrolytes replaced per protocol ?8.  Call MD STAT for K+ </= 2.5, Phos </= 1, or Mag </= 1 ?Physician:  n/a ? ?Eduardo [name] [name] 03/11/2022 3:52 AM ? ?

## 2022-03-11 NOTE — Progress Notes (Signed)
Orthopedic Tech Progress Note ?Patient Details:  ?TARRENCE ENCK ?12/30/1955 ?[id number] ? ?Called in order to [employer] for a VIVE PROTOCOL BK  ? ?Patient ID: Eduardo [name], male   DOB: 01-30-1956, 66 y.o.   MRN: [id number] ? ?Janit Pagan ?03/11/2022, 5:55 PM ? ?

## 2022-03-11 NOTE — Op Note (Signed)
03/11/2022 ? ?5:34 PM ? ?PATIENT:  Eduardo [name]   ? ?PRE-OPERATIVE DIAGNOSIS:  Abscess Right Ankle ? ?POST-OPERATIVE DIAGNOSIS:  Same ? ?PROCEDURE:  BELOW KNEE AMPUTATION, RIGHT LEG  ?Application of Kerecis tissue graft 7 x 10 cm. ?Application of autograft bone graft. ?Application of customized Prevena wound VAC. ? ?SURGEON:  Newt Minion, MD ? ?ANESTHESIA:   General ? ?PREOPERATIVE INDICATIONS:  KENZEL RUESCH is a  66 y.o. male with a diagnosis of Abscess Right Ankle who failed conservative measures and elected for surgical management.   ? ?The risks benefits and alternatives were discussed with the patient preoperatively including but not limited to the risks of infection, bleeding, nerve injury, cardiopulmonary complications, the need for revision surgery, among others, and the patient was willing to proceed. ? ?OPERATIVE IMPLANTS: Kerecis tissue graft 70 cm?. ? ? ?OPERATIVE FINDINGS: Patient had calcified arteries at the level of amputation there was good petechial bleeding. ? ?OPERATIVE PROCEDURE: Patient was brought to the operating room after undergoing a regional anesthetic.  After adequate levels anesthesia were obtained a thigh tourniquet was placed and the lower extremity was prepped using DuraPrep draped into a sterile field. The foot was draped out of the sterile field with impervious stockinette.  A timeout was called and the tourniquet inflated.  A transverse skin incision was made 12 cm distal to the tibial tubercle, the incision curved proximally, and a large posterior flap was created.  The tibia was transected just proximal to the skin incision and beveled anteriorly.  The fibula was transected just proximal to the tibial incision.  The sciatic nerve was pulled cut and allowed to retract.  The vascular bundles were suture ligated with 2-0 silk.  The tourniquet was deflated and hemostasis obtained.   ? ?The amputated leg was placed on the sterile back table and using the acetabular reamer bone graft  was obtained from the tibia.  This was then placed in the Kerecis 7 x 10 cm tissue graft and attached to the end of the residual bone stapled in place across the tibia and fibula.  ? ?the deep and superficial fascial layers were closed using #1 Vicryl.  The skin was closed using staples.  A customized Prevena wound VAC was applied, Ioban was used to secure the sponges and the circumferential compression was secured to the skin with Dermatac.  This was connected to the wound VAC pump and had a good suction fit this was covered with a stump shrinker and a limb protector.  Patient was taken to the PACU in stable condition. ? ? ?DISCHARGE PLANNING: ? ?Antibiotic duration: 24-hour antibiotics ? ?Weightbearing: Nonweightbearing on the operative extremity ? ?Pain medication: Opioid pathway ? ?Dressing care/ Wound VAC: Continue wound VAC with the Prevena plus pump at discharge for 1 week ? ?Ambulatory devices: Walker or kneeling scooter ? ?Discharge to: Discharge planning based on recommendations per physical therapy ? ?Follow-up: In the office 1 week after discharge. ? ? ? ? ? ? ? ? ?

## 2022-03-11 NOTE — Anesthesia Procedure Notes (Signed)
Procedure Name: LMA Insertion ?Date/Time: 03/11/2022 4:37 PM ?Performed by: Janene Harvey, CRNA ?Pre-anesthesia Checklist: Patient identified, Emergency Drugs available, Suction available and Patient being monitored ?Patient Re-evaluated:Patient Re-evaluated prior to induction ?Oxygen Delivery Method: Circle system utilized ?Preoxygenation: Pre-oxygenation with 100% oxygen ?Induction Type: IV induction ?LMA: LMA inserted ?LMA Size: 4.0 ?Placement Confirmation: positive ETCO2 ?Dental Injury: Teeth and Oropharynx as per pre-operative assessment  ? ? ? ? ?

## 2022-03-11 NOTE — Progress Notes (Signed)
Called anesthesia for CBG 116. No orders to initiate peri-op hyperglycemia protocol at this time.  ?

## 2022-03-11 NOTE — Progress Notes (Signed)
Pharmacy Antibiotic Note ? ?Eduardo [name] is a 66 y.o. male admitted on 03/08/2022 with sepsis likely secondary to foot wound.  Pharmacy has been consulted for Cefepime and vancomycin dosing. SCr down a bit to 1.71. ? ?Surgical intervention planned 5/3 with Dr. Sharol Given of Ortho. ? ?Plan: ?Cefepime 2g IV q12h ?Adjust vancomycin to '1250mg'$  IV q24h. Goal AUC 400-550. ?Expected AUC: 485 ?SCr used: 1.71 ?Monitor clinical progress, c/s, renal function ?F/u de-escalation plan/LOT post-op, vancomycin levels as indicated ? ? ?Height: '5\' 9"'$  (175.3 cm) ?Weight: 130.4 kg (287 lb 7.7 oz) ?IBW/kg (Calculated) : 70.7 ? ?Temp (24hrs), Avg:98.3 ?F (36.8 ?C), Min:97.8 ?F (36.6 ?C), Max:98.7 ?F (37.1 ?C) ? ?Recent Labs  ?Lab 03/08/22 ?1400 03/08/22 ?1440 03/08/22 ?1440 03/08/22 ?1732 03/08/22 ?1947 03/09/22 ?0202 03/09/22 ?1244 03/10/22 ?0221 03/11/22 ?0254  ?WBC  --  14.5*   < >  --  21.5* 16.8* 16.4* 12.2* 12.7*  ?CREATININE  --  2.01*  --   --  2.07* 2.02*  --  1.80* 1.71*  ?LATICACIDVEN 2.1*  --   --  1.4 1.3  --   --   --   --   ? < > = values in this interval not displayed.  ? ?  ?Estimated Creatinine Clearance: 56.9 mL/min (A) (by C-G formula based on SCr of 1.71 mg/dL (H)).   ? ?Allergies  ?Allergen Reactions  ? Shellfish-Derived Products Nausea And Vomiting and Other (See Comments)  ?  Only Mussels cause severe nausea and vomiting ?  ? Latex Rash  ? Tape Rash and Other (See Comments)  ?  Caused issues with the skin  ? Testosterone Rash  ?  Other reaction(s): did not feel well ?Other reaction(s): did not feel well  ? ? ?Antimicrobials this admission: ?Cefepime 4/30 >>  ?Vancomycin 4/30 >>  ?Metronidazole 4/30 x1 ? ?Microbiology results: ?4/30 MRSA PCR negative ?4/30 Blood cx - 4/4 Strep anginosis ? ? ?Arturo Morton, PharmD, BCPS ?Please check AMION for all [place] contact numbers ?Clinical Pharmacist ?03/11/2022 12:05 PM ?

## 2022-03-11 NOTE — Anesthesia Procedure Notes (Signed)
Anesthesia Regional Block: Popliteal block  ? ?Pre-Anesthetic Checklist: , timeout performed,  Correct Patient, Correct Site, Correct Laterality,  Correct Procedure, Correct Position, site marked,  Risks and benefits discussed,  Surgical consent,  Pre-op evaluation,  At surgeon's request and post-op pain management ? ?Laterality: Right ? ?Prep: chloraprep     ?  ?Needles:  ?Injection technique: Single-shot ? ?Needle Type: Echogenic Stimulator Needle   ? ? ?Needle Length: 9cm  ?Needle Gauge: 21  ? ? ? ?Additional Needles: ? ? ?Procedures:,,,, ultrasound used (permanent image in chart),,    ?Narrative:  ?Start time: 03/11/2022 3:05 PM ?End time: 03/11/2022 3:10 PM ?Injection made incrementally with aspirations every 5 mL. ? ?Performed by: Personally  ?Anesthesiologist: Effie Berkshire, MD ? ?Additional Notes: ?Patient tolerated the procedure well. Local anesthetic introduced in an incremental fashion under minimal resistance after negative aspirations. No paresthesias were elicited. After completion of the procedure, no acute issues were identified and patient continued to be monitored by RN.  ? ? ? ? ? ?

## 2022-03-12 ENCOUNTER — Inpatient Hospital Stay (HOSPITAL_COMMUNITY): Payer: Medicare Other

## 2022-03-12 DIAGNOSIS — A419 Sepsis, unspecified organism: Secondary | ICD-10-CM | POA: Diagnosis not present

## 2022-03-12 DIAGNOSIS — R6521 Severe sepsis with septic shock: Secondary | ICD-10-CM | POA: Diagnosis not present

## 2022-03-12 LAB — BASIC METABOLIC PANEL
Anion gap: 10 (ref 5–15)
BUN: 35 mg/dL — ABNORMAL HIGH (ref 8–23)
CO2: 19 mmol/L — ABNORMAL LOW (ref 22–32)
Calcium: 8 mg/dL — ABNORMAL LOW (ref 8.9–10.3)
Chloride: 105 mmol/L (ref 98–111)
Creatinine, Ser: 1.67 mg/dL — ABNORMAL HIGH (ref 0.61–1.24)
GFR, Estimated: 45 mL/min — ABNORMAL LOW (ref >60)
Glucose, Bld: 268 mg/dL — ABNORMAL HIGH (ref 70–99)
Potassium: 5.1 mmol/L (ref 3.5–5.1)
Sodium: 134 mmol/L — ABNORMAL LOW (ref 135–145)

## 2022-03-12 LAB — GLUCOSE, CAPILLARY
Glucose-Capillary: 254 mg/dL — ABNORMAL HIGH (ref 70–99)
Glucose-Capillary: 265 mg/dL — ABNORMAL HIGH (ref 70–99)
Glucose-Capillary: 288 mg/dL — ABNORMAL HIGH (ref 70–99)
Glucose-Capillary: 339 mg/dL — ABNORMAL HIGH (ref 70–99)
Glucose-Capillary: 195 mg/dL — ABNORMAL HIGH (ref 70–99)

## 2022-03-12 MED ORDER — INSULIN ASPART 100 UNIT/ML IJ SOLN
0.0000 [IU] | Freq: Three times a day (TID) | INTRAMUSCULAR | Status: DC
  Administered 2022-03-12: 11 [IU] via SUBCUTANEOUS
  Administered 2022-03-12: 15 [IU] via SUBCUTANEOUS
  Administered 2022-03-13: 7 [IU] via SUBCUTANEOUS
  Administered 2022-03-13: 3 [IU] via SUBCUTANEOUS
  Administered 2022-03-14: 11 [IU] via SUBCUTANEOUS
  Administered 2022-03-14 – 2022-03-15 (×3): 7 [IU] via SUBCUTANEOUS
  Administered 2022-03-15: 4 [IU] via SUBCUTANEOUS
  Administered 2022-03-16: 3 [IU] via SUBCUTANEOUS
  Administered 2022-03-16: 7 [IU] via SUBCUTANEOUS
  Administered 2022-03-16 – 2022-03-17 (×2): 4 [IU] via SUBCUTANEOUS
  Administered 2022-03-17 (×2): 7 [IU] via SUBCUTANEOUS
  Administered 2022-03-18: 3 [IU] via SUBCUTANEOUS
  Administered 2022-03-18: 7 [IU] via SUBCUTANEOUS
  Administered 2022-03-18 – 2022-03-19 (×3): 4 [IU] via SUBCUTANEOUS
  Administered 2022-03-19: 2 [IU] via SUBCUTANEOUS
  Administered 2022-03-20 (×2): 3 [IU] via SUBCUTANEOUS
  Administered 2022-03-21: 7 [IU] via SUBCUTANEOUS
  Administered 2022-03-21: 4 [IU] via SUBCUTANEOUS

## 2022-03-12 MED ORDER — INSULIN NPH (HUMAN) (ISOPHANE) 100 UNIT/ML ~~LOC~~ SUSP
20.0000 [IU] | Freq: Once | SUBCUTANEOUS | Status: AC
  Administered 2022-03-12: 20 [IU] via SUBCUTANEOUS
  Filled 2022-03-12: qty 10

## 2022-03-12 MED ORDER — INSULIN GLARGINE-YFGN 100 UNIT/ML ~~LOC~~ SOLN
40.0000 [IU] | Freq: Every day | SUBCUTANEOUS | Status: DC
  Administered 2022-03-12 – 2022-03-21 (×10): 40 [IU] via SUBCUTANEOUS
  Filled 2022-03-12 (×10): qty 0.4

## 2022-03-12 MED ORDER — INSULIN ASPART 100 UNIT/ML IJ SOLN
0.0000 [IU] | Freq: Three times a day (TID) | INTRAMUSCULAR | Status: DC
  Administered 2022-03-12: 11 [IU] via SUBCUTANEOUS

## 2022-03-12 MED ORDER — CALCIUM CARBONATE ANTACID 500 MG PO CHEW
1.0000 | CHEWABLE_TABLET | Freq: Three times a day (TID) | ORAL | Status: DC | PRN
  Administered 2022-03-14 – 2022-03-16 (×3): 200 mg via ORAL
  Filled 2022-03-12 (×3): qty 1

## 2022-03-12 MED ORDER — INSULIN ASPART 100 UNIT/ML IJ SOLN: 0.0000 [IU] | Freq: Three times a day (TID) | INTRAMUSCULAR | Status: DC

## 2022-03-12 MED ORDER — SODIUM CHLORIDE 0.9 % IV SOLN
2.0000 g | INTRAVENOUS | Status: DC
  Administered 2022-03-12 – 2022-03-16 (×5): 2 g via INTRAVENOUS
  Filled 2022-03-12 (×6): qty 20

## 2022-03-12 MED ORDER — INSULIN ASPART 100 UNIT/ML IJ SOLN
0.0000 [IU] | INTRAMUSCULAR | Status: DC
  Administered 2022-03-12: 11 [IU] via SUBCUTANEOUS

## 2022-03-12 MED ORDER — INSULIN ASPART 100 UNIT/ML IJ SOLN
0.0000 [IU] | Freq: Every day | INTRAMUSCULAR | Status: DC
  Administered 2022-03-13: 2 [IU] via SUBCUTANEOUS
  Administered 2022-03-17: 3 [IU] via SUBCUTANEOUS

## 2022-03-12 NOTE — Evaluation (Addendum)
Occupational Therapy Evaluation ?Patient Details ?Name: Eduardo [name] ?MRN: [id number] ?DOB: [birth year] ?Today's Date: 03/12/2022 ? ? ?History of Present Illness 66 y/o M presenting to [facility] on 4/30 with sepsis with exposed necrotic bone and ulcer over the base of the 5th met. Also with cellulitis extending up the peroneal tendons. 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 History of CAD s/p triple-vessel CABG in 2017, HTN, HLD, V0JJ, systolic HF, osteomyelitis and partial R foot amputation.  ? ?Clinical Impression ?  ?Pt admitted for concerns and procedure listed above. PTA pt reported that he was independent with all ADL's and IADL's, including fishing. At this time, pt presents with increased weakness, balance deficits, and decreased activity tolerance. He is requiring mod A to stand from bed and transfer to recliner, due to difficulty hopping/pivoting while maintaining balance on his LLE. As pt found his new center of gravity, he required less assist. Pt reporting some phantom limb sensations and OT provided techniques to desensitize the R limb to reduce pain and other sensations. Recommending AIR to maximize pt independence and safety. OT will follow acutely.   ?   ? ?Recommendations for follow up therapy are one component of a multi-disciplinary discharge planning process, led by the attending physician.  Recommendations may be updated based on patient status, additional functional criteria and insurance authorization.  ? ?Follow Up Recommendations ? Acute inpatient rehab (3hours/day)  ?  ?Assistance Recommended at Discharge Frequent or constant Supervision/Assistance  ?Patient can return home with the following A lot of help with walking and/or transfers;A lot of help with bathing/dressing/bathroom;Assistance with cooking/housework;Assist for transportation;Help with stairs or ramp for entrance ? ?  ?Functional Status Assessment ? Patient has had a recent decline in their functional status and demonstrates the ability to make  significant improvements in function in a reasonable and predictable amount of time.  ?Equipment Recommendations ? Wheelchair (measurements OT);Wheelchair cushion (measurements OT)  ?  ?Recommendations for Other Services Rehab consult ? ? ?  ?Precautions / Restrictions Precautions ?Precautions: Other (comment) ?Precaution Comments: R BKA ?Restrictions ?Weight Bearing Restrictions: No  ? ?  ? ?Mobility Bed Mobility ?Overal bed mobility: Needs Assistance ?Bed Mobility: Supine to Sit ?  ?  ?Supine to sit: Min guard ?  ?  ?General bed mobility comments: No physical assist needed, increased time, heavy use of bed rails. ?  ? ?Transfers ?Overall transfer level: Needs assistance ?Equipment used: Rolling walker (2 wheels) ?Transfers: Sit to/from Stand, Bed to chair/wheelchair/BSC ?Sit to Stand: Mod assist ?Stand pivot transfers: Mod assist ?  ?  ?  ?  ?General transfer comment: Mod A to power up and steady initially. Requiring further mod A for hop/pivot to recliner, for stability with pivot ?  ? ?  ?Balance Overall balance assessment: Needs assistance ?Sitting-balance support: No upper extremity supported, Feet supported ?Sitting balance-Leahy Scale: Good ?  ?  ?Standing balance support: Bilateral upper extremity supported, Reliant on assistive device for balance ?Standing balance-Leahy Scale: Poor ?Standing balance comment: heavily reliant on RW ?  ?  ?  ?  ?  ?  ?  ?  ?  ?  ?  ?   ? ?ADL either performed or assessed with clinical judgement  ? ?ADL Overall ADL's : Needs assistance/impaired ?Eating/Feeding: Independent;Sitting ?  ?Grooming: Set up;Sitting ?  ?Upper Body Bathing: Set up;Sitting ?  ?Lower Body Bathing: Moderate assistance;Sitting/lateral leans;Sit to/from stand ?  ?Upper Body Dressing : Set up;Sitting ?  ?Lower Body Dressing: Moderate assistance;Sitting/lateral leans;Sit to/from stand ?  ?  Toilet Transfer: Moderate assistance;Stand-pivot ?  ?Toileting- Clothing Manipulation and Hygiene: Moderate  assistance;Sitting/lateral lean;Sit to/from stand ?  ?  ?  ?Functional mobility during ADLs: Moderate assistance;Rolling walker (2 wheels) ?General ADL Comments: Pt very motivated, requiring increased assist as he gets used to new center of gravity and some weakness.  ? ? ? ?Vision Baseline Vision/History: 1 Wears glasses ?Ability to See in Adequate Light: 0 Adequate ?Patient Visual Report: No change from baseline ?Vision Assessment?: No apparent visual deficits  ?   ?Perception   ?  ?Praxis   ?  ? ?Pertinent Vitals/Pain Pain Assessment ?Pain Assessment: No/denies pain ?Pain Score: 0-No pain ?(Commented on phantom limb pain, however was not having any at this time)   ? ? ? ?Hand Dominance Right ?  ?Extremity/Trunk Assessment Upper Extremity Assessment ?Upper Extremity Assessment: Overall WFL for tasks assessed ?  ?Lower Extremity Assessment ?Lower Extremity Assessment: Defer to PT evaluation ?  ?Cervical / Trunk Assessment ?Cervical / Trunk Assessment: Kyphotic ?  ?Communication Communication ?Communication: No difficulties ?  ?Cognition   ?  ?  ?  ?  ?  ?  ?  ?  ?  ?  ?  ?  ?  ?  ?  ?  ?  ?  ?  ?  ?  ?General Comments  VSS on RA ? ?  ?Exercises   ?  ?Shoulder Instructions    ? ? ?Home Living Family/patient expects to be discharged to:: Private residence ?Living Arrangements: Spouse/significant other ?Available Help at Discharge: Available 24 hours/day;Family ?Type of Home: House ?Home Access: Stairs to enter ?Entrance Stairs-Number of Steps: 1, little lip ?Entrance Stairs-Rails: None ?Home Layout: One level ?  ?  ?Bathroom Shower/Tub: Gaffer;Tub/shower unit ?  ?Bathroom Toilet: Handicapped height ?Bathroom Accessibility: Yes ?  ?Home Equipment: Rollator (4 wheels);Rolling Walker (2 wheels);Shower seat - built in ?  ?  ?  ? ?  ?Prior Functioning/Environment Prior Level of Function : Independent/Modified Independent ?  ?  ?  ?  ?  ?  ?Mobility Comments: Walking, driving, fishing ?ADLs Comments: Retired  Designer, jewellery ?  ? ?  ?  ?OT Problem List: Decreased strength;Decreased activity tolerance;Impaired balance (sitting and/or standing);Decreased knowledge of use of DME or AE;Pain ?  ?   ?OT Treatment/Interventions: Self-care/ADL training;Therapeutic exercise;Energy conservation;DME and/or AE instruction;Therapeutic activities;Patient/family education;Balance training  ?  ?OT Goals(Current goals can be found in the care plan section) Acute Rehab OT Goals ?Patient Stated Goal: To get stronger ?OT Goal Formulation: With patient ?Time For Goal Achievement: 03/26/22 ?Potential to Achieve Goals: Good ?ADL Goals ?Pt Will Perform Lower Body Bathing: with supervision;sitting/lateral leans;sit to/from stand ?Pt Will Perform Lower Body Dressing: with supervision;sitting/lateral leans;sit to/from stand ?Pt Will Transfer to Toilet: with supervision;stand pivot transfer ?Pt Will Perform Toileting - Clothing Manipulation and hygiene: with supervision;sitting/lateral leans;sit to/from stand  ?OT Frequency: Min 2X/week ?  ? ?Co-evaluation   ?  ?  ?  ?  ? ?  ?AM-PAC OT "6 Clicks" Daily Activity     ?Outcome Measure Help from another person eating meals?: None ?Help from another person taking care of personal grooming?: A Little ?Help from another person toileting, which includes using toliet, bedpan, or urinal?: A Lot ?Help from another person bathing (including washing, rinsing, drying)?: A Lot ?Help from another person to put on and taking off regular upper body clothing?: A Little ?Help from another person to put on and taking off regular lower body clothing?: A Lot ?6  Click Score: 16 ?  ?End of Session Equipment Utilized During Treatment: Gait belt;Rolling walker (2 wheels) ?Nurse Communication: Mobility status ? ?Activity Tolerance: Patient tolerated treatment well ?Patient left: in chair;with call bell/phone within reach ? ?OT Visit Diagnosis: Unsteadiness on feet (R26.81);Other abnormalities of gait and mobility  (R26.89);Muscle weakness (generalized) (M62.81)  ?              ?Time: 7209-1068 ?OT Time Calculation (min): 30 min ?Charges:  OT General Charges ?$OT Visit: 1 Visit ?OT Evaluation ?$OT Eval Moderate Complexity: 1 Mod ? ?Damarien Nyman H., OTR/L

## 2022-03-12 NOTE — Evaluation (Signed)
Physical Therapy Evaluation ?Patient Details ?Name: Eduardo [name] ?MRN: [id number] ?DOB: August 07, 1956 ?Today's Date: 03/12/2022 ? ?History of Present Illness ? 66 y/o M presenting to [facility] on 4/30 with sepsis with exposed necrotic bone and ulcer over the base of the 5th met. Also with cellulitis extending up the peroneal tendons. 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 History of CAD s/p triple-vessel CABG in 2017, HTN, HLD, B7SE, systolic HF, osteomyelitis and partial R foot amputation.  ?Clinical Impression ? Pt presents with decreased functional mobility, transfer ability, balance, and gait secondary to diagnosis above. These impairments are limiting his ability to safely and independently transfer, get into his home, perform all adls/iadls, and mobilize in the community. Pt to benefit from acute PT to address deficits. STS, standing balance, and pre-gait activities  performed. The pt requires up to mod A +2 to complete a rise from the chair and min A to maintain standing balance. He was able to do 2 bouts of this with an extended rest break between. He was educated on joint positioning to avoid developing contractures, phantom limb pain, and proper nutrition to aide in his healing, he accepted all education.  Pt. Responded well but was limited secondary to decreased transfer ability and balance deficits. SPT recommends AIR follow up for further transfer, standing, balance, and w/c training once medically stable for d/c. PT to progress mobility as tolerated, and will continue to follow acutely.  ?  ?   ? ?Recommendations for follow up therapy are one component of a multi-disciplinary discharge planning process, led by the attending physician.  Recommendations may be updated based on patient status, additional functional criteria and insurance authorization. ? ?Follow Up Recommendations Acute inpatient rehab (3hours/day) ? ?  ?Assistance Recommended at Discharge Intermittent Supervision/Assistance  ?Patient can return home with the  following ? A lot of help with bathing/dressing/bathroom;Assistance with cooking/housework;Two people to help with walking and/or transfers;Help with stairs or ramp for entrance;Assist for transportation ? ?  ?Equipment Recommendations Wheelchair cushion (measurements PT);Wheelchair (measurements PT);BSC/3in1 (drop arm bsc)  ?Recommendations for Other Services ? Rehab consult  ?  ?Functional Status Assessment Patient has had a recent decline in their functional status and demonstrates the ability to make significant improvements in function in a reasonable and predictable amount of time.  ? ?  ?Precautions / Restrictions Precautions ?Precautions: Other (comment) ?Precaution Comments: R BKA ?Restrictions ?Weight Bearing Restrictions: No  ? ?  ? ?Mobility ? Bed Mobility ?Overal bed mobility: Needs Assistance ?  ?  ?  ?  ?  ?  ?General bed mobility comments: Pt in chair upon PT arrival ?  ? ?Transfers ?Overall transfer level: Needs assistance ?Equipment used: Rolling walker (2 wheels) ?Transfers: Sit to/from Stand, Bed to chair/wheelchair/BSC ?Sit to Stand: Mod assist, +2 physical assistance ?  ?  ?  ?  ?  ?General transfer comment: Pt needed mod A +2 for full power up, he was able to initiate the lift from the chair. He requires min A to steady with RW. Min A for weight shifting. ?  ? ?Ambulation/Gait ?  ?  ?  ?  ?  ?  ?Pre-gait activities: Heel lifts from ground, Heel-toe pivots, with weight shifting cues ?  ? ? ?  ? ?Balance Overall balance assessment: Needs assistance ?Sitting-balance support: No upper extremity supported, Feet supported ?Sitting balance-Leahy Scale: Good ?Sitting balance - Comments: Able to scoot and sit on the edge of the chair w/o difficulty ?  ?Standing balance support: Bilateral upper extremity  supported, Reliant on assistive device for balance ?Standing balance-Leahy Scale: Poor ?Standing balance comment: heavily reliant on RW ?  ?  ?  ?  ?  ?  ?  ?  ?  ?  ?  ?   ? ? ? ?Pertinent Vitals/Pain  Pain Assessment ?Pain Assessment: No/denies pain ?Pain Score: 0-No pain  ? ? ?   ?   ? ? ?   ?Extremity/Trunk Assessment  ? Upper Extremity Assessment ?Upper Extremity Assessment: Defer to OT evaluation ?  ? ?Lower Extremity Assessment ?Lower Extremity Assessment: RLE deficits/detail;LLE deficits/detail ?RLE Deficits / Details: BKA ?RLE: Unable to fully assess due to immobilization;Unable to fully assess due to pain ?LLE Deficits / Details: WFL for task assessed ?  ? ?Cervical / Trunk Assessment ?Cervical / Trunk Assessment: Kyphotic  ?Communication  ?    ?Cognition Arousal/Alertness: Awake/alert ?Behavior During Therapy: [facility] for tasks assessed/performed ?Overall Cognitive Status: Within Functional Limits for tasks assessed ?  ?  ?  ?  ?  ?  ?  ?  ?  ?  ?  ?  ?  ?  ?  ?  ?  ?  ?  ? ?  ?General Comments General comments (skin integrity, edema, etc.): VSS on RA ? ?  ?   ? ?Assessment/Plan  ?  ?PT Assessment Patient needs continued PT services  ?PT Problem List Decreased strength;Decreased mobility;Decreased range of motion;Decreased coordination;Decreased activity tolerance;Decreased balance;Decreased knowledge of use of DME;Decreased skin integrity ? ?   ?  ?PT Treatment Interventions DME instruction;Therapeutic activities;Gait training;Therapeutic exercise;Patient/family education;Stair training;Balance training;Wheelchair mobility training;Functional mobility training;Neuromuscular re-education   ? ?PT Goals (Current goals can be found in the Care Plan section)  ?Acute Rehab PT Goals ?Patient Stated Goal: To improve mobility and independence ?PT Goal Formulation: With patient ?Time For Goal Achievement: 03/26/22 ?Potential to Achieve Goals: Good ?Additional Goals ?Additional Goal #1: Pt will show independent management of all wheelchair parts and functions. ?Additional Goal #2: Pt will be able to propel, navigate, and turn w/c supervision level. ? ?  ?Frequency Min 4X/week ?  ? ? ?   ?AM-PAC PT "6 Clicks" Mobility   ?Outcome Measure Help needed turning from your back to your side while in a flat bed without using bedrails?: A Little ?Help needed moving from lying on your back to sitting on the side of a flat bed without using bedrails?: A Little ?Help needed moving to and from a bed to a chair (including a wheelchair)?: A Lot ?Help needed standing up from a chair using your arms (e.g., wheelchair or bedside chair)?: A Lot ?Help needed to walk in hospital room?: Total ?Help needed climbing 3-5 steps with a railing? : Total ?6 Click Score: 12 ? ?  ?End of Session Equipment Utilized During Treatment: Gait belt ?Activity Tolerance: Patient tolerated treatment well ?Patient left: in chair;with call bell/phone within reach;with chair alarm set ?Nurse Communication: Mobility status;Need for lift equipment ?PT Visit Diagnosis: Unsteadiness on feet (R26.81);Other abnormalities of gait and mobility (R26.89) ?  ? ?Time: 2831-5176 ?PT Time Calculation (min) (ACUTE ONLY): 31 min ? ? ?Charges:   PT Evaluation ?$PT Eval Moderate Complexity: 1 Mod ?PT Treatments ?$Neuromuscular Re-education: 8-22 mins ?  ?   ? ? ?Thermon Leyland, SPT ?Acute Rehab Services ? ? ?Thermon Leyland ?03/12/2022, 3:16 PM ? ?

## 2022-03-12 NOTE — Progress Notes (Signed)
eLink Physician-Brief Progress Note ?Patient Name: Eduardo [name] ?DOB: 1956-01-30 ?MRN: [id number] ? ? ?Date of Service ? 03/12/2022  ?HPI/Events of Note ? hyperglycemia  ?eICU Interventions ? Increased q4h insulin to high dose/resistant sliding scale.   ? ? ? ?  ? ?[place] ?03/12/2022, 4:06 AM ?

## 2022-03-12 NOTE — TOC Progression Note (Signed)
Transition of Care (TOC) - Initial/Assessment Note  ? ? ?Patient Details  ?Name: Eduardo [name] ?MRN: [id number] ?Date of Birth: 09-Oct-1956 ? ?Transition of Care (TOC) CM/SW Contact:    ?Paulene Floor Lajuane Leatham, LCSWA ?Phone Number: ?03/12/2022, 11:46 AM ? ?Clinical Narrative:                 ?TOC following patient for any d/c planning needs once medically stable. ? ?OT is recommending AIR.  PT assessments and recommendations pending.   ? ?Lind Covert, MSW, LCSWA  ? ?  ?  ? ? ?Patient Goals and CMS Choice ?  ?  ?  ? ?Expected Discharge Plan and Services ?  ?  ?  ?  ?  ?                ?  ?  ?  ?  ?  ?  ?  ?  ?  ?  ? ?Prior Living Arrangements/Services ?  ?  ?  ?       ?  ?  ?  ?  ? ?Activities of Daily Living ?Home Assistive Devices/Equipment: Eyeglasses, CBG Meter ?ADL Screening (condition at time of admission) ?Patient's cognitive ability adequate to safely complete daily activities?: Yes ?Is the patient deaf or have difficulty hearing?: No ?Does the patient have difficulty seeing, even when wearing glasses/contacts?: No ?Does the patient have difficulty concentrating, remembering, or making decisions?: No ?Patient able to express need for assistance with ADLs?: Yes ?Does the patient have difficulty dressing or bathing?: No ?Independently performs ADLs?: Yes (appropriate for developmental age) ?Does the patient have difficulty walking or climbing stairs?: No ?Weakness of Legs: None ?Weakness of Arms/Hands: None ? ?Permission Sought/Granted ?  ?  ?   ?   ?   ?   ? ?Emotional Assessment ?  ?  ?  ?  ?  ?  ? ?Admission diagnosis:  History of congestive heart failure [Z86.79] ?Acute respiratory failure with hypoxia ([place]) [J96.01] ?AKI (acute kidney injury) ([place]) [N17.9] ?Septic shock ([place]) [A41.9, R65.21] ?Osteomyelitis of right foot, unspecified type ([place]) [M86.9] ?Patient Active Problem List  ? Diagnosis Date Noted  ? Septic shock ([place]) 03/08/2022  ? Acute systolic heart failure ([place]) 12/28/2020  ? Acute pulmonary edema ([place])  10/11/2020  ? Non-ST elevation (NSTEMI) myocardial infarction ([place]) 10/11/2020  ? Acute respiratory distress 10/11/2020  ? Acute on chronic systolic heart failure ([place]) 10/11/2020  ? Cardiogenic shock ([place])   ? Partial nontraumatic amputation of foot, right ([place]) 06/21/2019  ? Osteomyelitis of right foot ([place])   ? Ischemic ulcer of lower leg due to atherosclerosis ([place]) 02/28/2019  ? Essential hypertension, benign 02/06/2019  ? Hypercholesteremia 02/06/2019  ? CKD (chronic kidney disease) stage 3, GFR 30-59 ml/min (HCC) 10/06/2017  ? Proteinuria 10/06/2017  ? 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 3-vessel coronary artery disease 05/23/2017  ? Abnormal nuclear stress test 09/06/2016  ? PAD (peripheral artery disease) ([place]) 10/28/2012  ? Morbid obesity with BMI of 40.0-44.9, adult ([place]) 10/13/2012  ? Diabetes mellitus with ophthalmic complication ([place]) 17/61/6073  ? Hypertension associated with diabetes ([place]) 10/13/2012  ? Atherosclerosis of native artery of extremity with intermittent claudication ([place]) 04/05/2012  ? Adenomatous colon polyp 09/28/2011  ? Pure hyperglyceridemia 09/28/2011  ? Erectile dysfunction 09/28/2011  ? Diabetic retinopathy ([place]) 09/28/2011  ? ?PCP:  Rita Ohara, MD ?Pharmacy:   ?[id number] - [place], [address] ?[place] ?[place] [address] ?Phone: [phone] Fax: [phone] ? ?[place] ([facility] ) - [place], [address] ?[place] [address] ?Phone: [phone] Fax: [phone] ? ?[facility] ?[address] ?[place] [address] ?Phone: [phone] Fax: [phone] ? ?[facility] ([place] ?[place] ?[address] ?[place] [address] ?Phone: [phone] Fax: [phone] ? ? ? ? ?Social Determinants of Health  (SDOH) Interventions ?  ? ?Readmission Risk Interventions ?   ? View : No data to display.  ?  ?  ?  ? ? ? ?

## 2022-03-12 NOTE — Progress Notes (Signed)
eLink Physician-Brief Progress Note ?Patient Name: Eduardo [name] ?DOB: [birth year] ?MRN: [id number] ? ? ?Date of Service ? 03/12/2022  ?HPI/Events of Note ? Patient c/o SOB - Currently on 3 L/min [place] O2 with sat = 92% and RR = 20 - 25. LVEF < 20% on last cardiac echo 12/29/2020.  ?eICU Interventions ? Plan: ?Portable CXR STAT.   ? ? ? ?Intervention Category ?Major Interventions: Other: ? ?Jearlene Bridwell Cornelia Copa ?03/12/2022, 10:11 PM ?

## 2022-03-12 NOTE — Progress Notes (Signed)
Pt states he does not feel like he needs BiPAP right now will call if he needs it. RT will continue to monitor. ?

## 2022-03-12 NOTE — Progress Notes (Signed)
Patient ID: Eduardo [name], male   DOB: Jul 17, 1956, 66 y.o.   MRN: [id number] ?Patient is postoperative day 1 right below-knee amputation.  Patient had extensive calcification of the arteries at the amputation level.  The wound VAC has no drainage good suction fit.  Discharge planning based on therapy recommendations.  Patient only needs to wear the limb protector when he is up with therapy. ?

## 2022-03-12 NOTE — Progress Notes (Signed)
? ?NAME:  Eduardo [name], MRN:  [id number], DOB:  [birth year], LOS: 4 ?ADMISSION DATE:  03/08/2022, CONSULTATION DATE:  4/30 ?REFERRING MD:  Francee Piccolo, CHIEF COMPLAINT:  Dyspea  ? ?History of Present Illness:  ?66 y/o male presented to the [facility] ER complaining of dyspnea, found to be in septic shock due to osteomyelitis of his right foot.   ? ?He has a complex past medical history and follows with [facility] for a complicated cardiac history which includes coronary artery disease status post triple-vessel CABG in 2017, hypertension and hyperlipidemia.  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 Sharol Given in the past.  He has a history of peripheral vascular disease and has a history of right superficial femoral artery stenting in 2020, he takes Plavix for that.  He also takes amiodarone for frequent PVCs in the setting of coronary disease and systolic heart failure. ? ?The patient tells me that over the last week he has been feeling poorly.  He has been having fevers off and on with associated chills and generalized weakness.  He threw up one time.  He denies nausea, vomiting or diarrhea.  No abdominal pain or rash.  However, his right foot wound has been swelling and draining more and has been foul-smelling for the last week.  He has not been on any antibiotics.  He said that today he started to feel more weak and was struggling to walk around in his house.  He thought that he may be having a heart attack given his extensive cardiac history so he took a nitroglycerin tablet.  At this point because he was so weak and unable to move his girlfriend called 911 and he was brought into the emergency department by EMS.  There he was recognized to have septic shock and has been given IV antibiotics and 30 cc per keg of crystalloid.  Despite that the patient was still hypotensive so he was started on peripheral Levophed. ? ?The patient says that the chronic  wound has been addressed in the wound center here in town with hyperbaric therapy and wound management.  However, it has been quite sometime since he saw them. ? ?The patient tells me that at baseline he has relatively low blood pressure and his systolic blood pressure typically runs in the 90s. ? ?Pertinent  Medical History  ?CAD status post CABG in 2017 ?Peripheral arterial disease status post stenting of right superficial femoral artery in 2020 ?Diabetes mellitus type 2 ?Hypertension ?Hyperlipidemia ?Systolic heart failure secondary to ischemic cardiomyopathy February 2022 echocardiogram LVEF less than 20% with severely decreased function.  RV systolic function is low normal, mild to moderate mitral valve regurgitation ?Osteomyelitis of right foot, chronic wound treated in the wound center with hyperbaric therapy and status post partial amputation by Dr. Sharol Given in the past. ? ?Significant Hospital Events: ?Including procedures, antibiotic start and stop dates in addition to other pertinent events   ?4/30  Admitted for septic shock secondary to right foot osteomyelitis, fifth metatarsal destruction noted on foot x-ray, orthopedic surgery consulted (Dr. Sharol Given service) ?5/1 Went to MRI this am and developed respiratory distress > unable to complete due to SOB in setting of cough - tried to sit up to cough and became acutely more SOB.  Returned to ICU and placed on BiPAP. 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 ?Interim History / Subjective:  ?Pressors off. Pain controlled. Cr stable. Glucose up ? ?Objective   ?Blood pressure (!) 102/54, pulse 92, temperature 98 ?F (36.7 ?C), temperature source Oral, resp. rate (!) 23, height '5\' 9"'$  (1.753 m), weight 130.4 kg, SpO2 98 %. ?   ?    ? ?Intake/Output Summary (Last 24 hours) at 03/12/2022 1418 ?Last data filed at 03/12/2022 0825 ?Gross per 24 hour  ?Intake 2559.32 ml  ?Output 1350 ml  ?Net 1209.32 ml  ? ? ?Filed Weights  ? 03/08/22 1355 03/09/22 0449 03/11/22 0515  ?Weight: 124.3 kg 124.3 kg 130.4 kg  ? ? ?Examination: ?General: pleasant adult male sitting up in bed in NAD ?HEENT: MM pink/moist, anicteric ?Neuro: AAOx4, speech clear, MAE ?CV: s1s2 RRR, wide QRS but SR, no m/r/g ?PULM: non-labored at rest, lungs bilaterally clear anterior, diminished bases ?GI: soft, bsx4 active  ?Extremities: warm/dry, no overt edema  ?Skin: no rashes on exposed skin. R foot wrapped with known foot wound  ? ?Resolved Hospital Problem list   ? ? ?Assessment & Plan:  ? ?Septic Shock secondary to Right Foot Cellulitis / Osteomyelitis and Strep Bacteremia  ?Baseline SBP 90 ?-hold home entresto, diuresis  ?-narrow to CTX post-op, repeat cultures, plan 2 weeks, TEE only if repeat cultures positive ?-appreciate orthopedics assistance with patient care ?-MRI with osteo, source control s/p R BKA ? ?Chronic systolic heart failure, currently not in exacerbation ?-hold home medications for now with hypotension ?-follow volume status closely ? ?Coronary artery disease, PVD ?Peripheral vascular disease status post stenting of superficial femoral artery in past ?-resumne home plavix 5/5 ?-continue zetia, statin ? ?Diabetes Mellitus Type 2 with Hyperglycemia  ?-s/p insulin drip PM 5/2 ?- Lantus 40 daily, resistant SSI ? ?AKI: Improved, approaching baseline ?-Trend BMP / urinary output ?-Replace electrolytes as indicated ?-Avoid nephrotoxic agents, ensure adequate renal perfusion ? ?Hypophosphatemia  ?-replace as indicated  ? ?Best Practice (right click and "Reselect all SmartList Selections" daily)  ?Diet/type: Regular consistency (see orders) ?DVT prophylaxis: prophylactic heparin  ?GI prophylaxis: N/A ?Lines: N/A ?Foley:  N/A ?Code Status:  full code ?Last date of  multidisciplinary goals of care discussion:  5/1 further clarified with patient, he does not want CPR in the event of arrest and under "normal circumstances is a DNR" but is hopeful his foot issue can be managed. He would be accepting of short term intubation if necessary but would not want long term mechanical ventilation or trach. Code status to LCB. ? ?Critical care time: n/a  ? ?Lanier Clam, MD ?[facility] ?03/12/2022, 2:18 PM ? ?Please see [email or web address] for contact info ?From 7A-7P if no response, please call [phone] ?After hours, please call Warren Lacy [phone] ? ? ?

## 2022-03-12 NOTE — Anesthesia Postprocedure Evaluation (Signed)
Anesthesia Post Note ? ?Patient: DONALD MEMOLI ? ?Procedure(s) Performed: BELOW KNEE AMPUTATION (Right) ?RIGHT LEG DEBRIDEMENT VS. BELOW KNEE AMPUTATION (Right: Knee) ? ?  ? ?Patient location during evaluation: PACU ?Anesthesia Type: General ?Level of consciousness: awake and alert ?Pain management: pain level controlled ?Vital Signs Assessment: post-procedure vital signs reviewed and stable ?Respiratory status: spontaneous breathing, nonlabored ventilation, respiratory function stable and patient connected to nasal cannula oxygen ?Cardiovascular status: blood pressure returned to baseline and stable ?Postop Assessment: no apparent nausea or vomiting ?Anesthetic complications: no ?Comments: NE restarted intraoperatively, will attempt wean.  ? ? ?No notable events documented. ? ?  ?  ?  ?  ?  ?  ? ?Effie Berkshire ? ? ? ? ?

## 2022-03-13 DIAGNOSIS — E1165 Type 2 diabetes mellitus with hyperglycemia: Secondary | ICD-10-CM

## 2022-03-13 DIAGNOSIS — I5043 Acute on chronic combined systolic (congestive) and diastolic (congestive) heart failure: Secondary | ICD-10-CM

## 2022-03-13 DIAGNOSIS — A419 Sepsis, unspecified organism: Secondary | ICD-10-CM | POA: Diagnosis not present

## 2022-03-13 DIAGNOSIS — N179 Acute kidney failure, unspecified: Secondary | ICD-10-CM

## 2022-03-13 DIAGNOSIS — B951 Streptococcus, group B, as the cause of diseases classified elsewhere: Secondary | ICD-10-CM

## 2022-03-13 DIAGNOSIS — I2510 Atherosclerotic heart disease of native coronary artery without angina pectoris: Secondary | ICD-10-CM

## 2022-03-13 DIAGNOSIS — R7881 Bacteremia: Secondary | ICD-10-CM

## 2022-03-13 DIAGNOSIS — Z794 Long term (current) use of insulin: Secondary | ICD-10-CM

## 2022-03-13 LAB — BASIC METABOLIC PANEL
Anion gap: 7 (ref 5–15)
BUN: 48 mg/dL — ABNORMAL HIGH (ref 8–23)
CO2: 23 mmol/L (ref 22–32)
Calcium: 8.1 mg/dL — ABNORMAL LOW (ref 8.9–10.3)
Chloride: 106 mmol/L (ref 98–111)
Creatinine, Ser: 1.82 mg/dL — ABNORMAL HIGH (ref 0.61–1.24)
GFR, Estimated: 40 mL/min — ABNORMAL LOW (ref >60)
Glucose, Bld: 154 mg/dL — ABNORMAL HIGH (ref 70–99)
Potassium: 4.7 mmol/L (ref 3.5–5.1)
Sodium: 136 mmol/L (ref 135–145)

## 2022-03-13 LAB — GLUCOSE, CAPILLARY
Glucose-Capillary: 132 mg/dL — ABNORMAL HIGH (ref 70–99)
Glucose-Capillary: 166 mg/dL — ABNORMAL HIGH (ref 70–99)
Glucose-Capillary: 203 mg/dL — ABNORMAL HIGH (ref 70–99)
Glucose-Capillary: 214 mg/dL — ABNORMAL HIGH (ref 70–99)

## 2022-03-13 LAB — PHOSPHORUS: Phosphorus: 2.8 mg/dL (ref 2.5–4.6)

## 2022-03-13 MED ORDER — FUROSEMIDE 10 MG/ML IJ SOLN
40.0000 mg | Freq: Once | INTRAMUSCULAR | Status: AC
Start: 2022-03-13 — End: 2022-03-13
  Administered 2022-03-13: 40 mg via INTRAVENOUS
  Filled 2022-03-13: qty 4

## 2022-03-13 MED ORDER — ASPIRIN EC 81 MG PO TBEC
81.0000 mg | DELAYED_RELEASE_TABLET | Freq: Every day | ORAL | Status: DC
  Administered 2022-03-13 – 2022-03-21 (×9): 81 mg via ORAL
  Filled 2022-03-13 (×9): qty 1

## 2022-03-13 MED ORDER — CLOPIDOGREL BISULFATE 75 MG PO TABS
75.0000 mg | ORAL_TABLET | Freq: Every day | ORAL | Status: DC
  Administered 2022-03-13 – 2022-03-21 (×9): 75 mg via ORAL
  Filled 2022-03-13 (×9): qty 1

## 2022-03-13 MED ORDER — FUROSEMIDE 10 MG/ML IJ SOLN
40.0000 mg | Freq: Every day | INTRAMUSCULAR | Status: DC
  Administered 2022-03-13 – 2022-03-14 (×2): 40 mg via INTRAVENOUS
  Filled 2022-03-13 (×2): qty 4

## 2022-03-13 NOTE — Assessment & Plan Note (Addendum)
-   Continue Zetia, gemfibrozil, Crestor ?- Continue aspirin, Plavix ?- Hold Coreg, Imdur, Entresto, Jardiance ?

## 2022-03-13 NOTE — Progress Notes (Signed)
Inpatient Rehab Admissions Coordinator:  ? ?Met with patient at bedside to discuss CIR recommendations and goals/expectations of CIR stay.  Reviewed 3 hrs/day of therapy with estimated length of stay 2 weeks, and goals of modified independence (likely w/c level for community, at least, and possibly short distance ambulation at home).  Pt is agreeable to CIR program and reports his significant other lives with him, and they are both retired.  She is able to provide assist with ADLs and transfers if needed, but I do believe he will reach mod I level with CIR program.  I reviewed insurance auth process, and let him know I would start that today.  Will continue to follow.  ? ?Shann Medal, PT, DPT ?Admissions Coordinator ?[phone] ?03/13/22  ?2:42 PM ? ?

## 2022-03-13 NOTE — Progress Notes (Signed)
eLink Physician-Brief Progress Note ?Patient Name: Eduardo [name] ?DOB: Jan 24, 1956 ?MRN: [id number] ? ? ?Date of Service ? 03/13/2022  ?HPI/Events of Note ? Review of CXR reveals cardiomegaly with mild pulmonary edema pattern. Infection is not excluded in the lung bases. Sat - 94% and RR = 20 on 4 L/min [place].  ?eICU Interventions ? Plan: ?Lasix 40 mg IV X 1 now.   ? ? ? ?Intervention Category ?Major Interventions: Hypoxemia - evaluation and management ? ?Wynema Garoutte Cornelia Copa ?03/13/2022, 1:02 AM ?

## 2022-03-13 NOTE — Assessment & Plan Note (Addendum)
Patient's baseline EF is less than 20% due to ischemic heart disease. ? ?Net negative 1.4L yesterday back on home torsemide, symptoms seem better.  Still needs titration of GDMT.  ?- Continue home torsemide ?- Continue Entresto ?- Hold Imdur ?- Hold jardiance for now ?- Will need titration of BB if BP tolerates ?- Strict ins and outs, daily BMP ? ? ?

## 2022-03-13 NOTE — Plan of Care (Signed)
?  Problem: Clinical Measurements: ?Goal: Will remain free from infection ?Outcome: Progressing ?Goal: Diagnostic test results will improve ?Outcome: Progressing ?  ?Problem: Pain Managment: ?Goal: General experience of comfort will improve ?Outcome: Progressing ?  ?Problem: Safety: ?Goal: Ability to remain free from injury will improve ?Outcome: Progressing ?  ?

## 2022-03-13 NOTE — Assessment & Plan Note (Signed)
Cr back to baseline 1.5-1.8 ?

## 2022-03-13 NOTE — Assessment & Plan Note (Signed)
Creatinine 2.1 on admission, improved down now to 1.7/baseline. ?

## 2022-03-13 NOTE — Progress Notes (Signed)
?Progress Note ? ? ?Patient: Eduardo [name] DOB: 12/27/1955 DOA: 03/08/2022     5 ?DOS: the patient was seen and examined on 03/13/2022 at 1128 AM ?  ? ? ? ?Brief hospital course: ?[name] is a 66 y.o. M with CAD s/p CABG 2017, PVD s/p R SFA stent 2020, DM, chronic heart failure EF 15-20%, chronic kidney disease and prior right partial foot amputation who presented with few days fever, chills, malaise and painful swollen right foot wound. ? ?In the ER he was started on peripheral Levophed and admitted to the ICU on vancomycin and Cefepime. ? ? ?4/30: Admitted to ICU ?5/1: Ortho consulted, noted exposed bone at base of 5th metatarsal and cellulitis up peroneal tendons ?5/2: MRI shows osteo, tenosynovitis; able to stop Levophed that night ?5/3: To OR for RIGHT BKA by Dr. Sharol Given with Martin Majestic, pressors resumed post-op ?5/4: Off pressors again ?5/5: +12.9L on admission, diuresis started ? ? ? ? ?Assessment and Plan: ?* Septic shock ([place]) ?Patient presented with tachycardia, tachypnea, and sustained blood pressure less than 90 systolic in the setting of streptococcal bacteremia and diabetic foot infection. ? ?Treated with Levophed for several days in the ICU, hemodynamics improved back to baseline. ? ?Streptococcal bacteremia ?Blood cultures on admission growing Streptococcus anginosus, source diabetic foot infection.  Low suspicion for endocarditis. ?- Follow repeat cultures ?- Continue Rocephin ? ?Hypophosphatemia ?Supplemented and resolved ? ?AKI (acute kidney injury) ([place]) ?Creatinine 2.1 on admission, improved down now to 1.7/baseline. ? ?Acute on chronic combined systolic and diastolic CHF (congestive heart failure) ([place]) ?Patient's baseline EF is less than 20% due to ischemic heart disease. ? ?Net -1.6 L in the last day, but +11 L on admission.  Overnight hypoxic, dyspneic, chest x-ray showing bilateral edema, and cardiomegaly. ?- Continue IV Lasix ?- Monitor potassium ?- Strict ins and outs, daily BMP ?-  Hold Jardiance, torsemide, Entresto, Imdur ? ? ? ?Osteomyelitis of right foot ([place]) ?S/p right BKA by Dr. Sharol Given on 5/4 ? ?Essential hypertension, benign ?Blood pressure soft ?- Hold carvedilol, Imdur, Entresto, torsemide ? ?Controlled type 2 diabetes mellitus with hyperglycemia, with long-term current use of insulin ([place]) ?Glucose is improved to normal overnight ?- Continue glargine ?- Continue sliding scale corrections ?- Hold home 70/30 and terzepitide and Jardiance ? ?Stage 3b chronic kidney disease (CKD) ([place]) ?Cr back to baseline 1.5-1.8 ? ?Coronary artery disease involving native coronary artery of native heart without angina pectoris ?- Continue Zetia, gemfibrozil, Crestor ?- Resume aspirin ?- Hold Coreg, Imdur, Entresto, Jardiance ? ?PAD (peripheral artery disease) ([place]) ?- Continue Crestor, Zetia, gemfibrozil ?- Resume aspirin ? ?Morbid obesity with BMI of 40.0-44.9, adult ([place]) ?BMI 42 ? ? ? ? ? ? ? ? ? ?Subjective: Patient's respiratory status is improved, he has no significant dyspnea at this time, he has some mild right anterior shin pain in the BKA stump, he has no fever, confusion, vomiting. ? ? ? ? ?Physical Exam: ?Vitals:  ? 03/13/22 1000 03/13/22 1100 03/13/22 1154 03/13/22 1328  ?BP: 117/72 112/65  122/76  ?Pulse: (!) 103 98  (!) 105  ?Resp: (!) 26 (!) 23  (!) 22  ?Temp:   97.7 ?F (36.5 ?C) 98.1 ?F (36.7 ?C)  ?TempSrc:   Oral Oral  ?SpO2: 90% 95%  93%  ?Weight:      ?Height:      ? ?Obese adult male, lying in bed, no obvious distress, interactive and appropriate ?RRR, I do not appreciate peripheral edema, JVP not visible due  to body habitus ?Lungs clear, respiratory effort normal, no rales or wheezes ?Abdomen soft without tenderness palpation or guarding ?Right leg is in [place] with Prevena wound VAC in place, no significant swelling or tenderness to palpation ?Attention normal, affect appropriate, judgment insight appear normal, appropriate and oriented to person place and situation, moves  upper extremities with normal strength and coordination, speech fluent, face symmetric ? ? ? ? ? ? ?Data Reviewed: ?Nursing notes reviewed, vital signs reviewed ?Blood cultures growing strep anginosus in the first set, no growth yet on the second ?Glucose improved to normal ?Creatinine stable at 1.8 ?Chest x-ray overnight shows edema and cardiomegaly ? ?Family Communication:   ? ? ? ?Disposition: ?Status is: Inpatient ? ? ? ? ? ? ? ? ?Author: ?Edwin Dada, MD ?03/13/2022 1:36 PM ? ?For on call review [email or web address].  ? ? ?

## 2022-03-13 NOTE — Assessment & Plan Note (Signed)
Patient presented with tachycardia, tachypnea, and sustained blood pressure less than 90 systolic in the setting of streptococcal bacteremia and diabetic foot infection. ? ?Treated with Levophed for several days in the ICU, hemodynamics improved back to baseline. ?

## 2022-03-13 NOTE — Assessment & Plan Note (Signed)
-   Supplemented and resolved 

## 2022-03-13 NOTE — Assessment & Plan Note (Signed)
BMI 42 

## 2022-03-13 NOTE — Assessment & Plan Note (Addendum)
Blood pressure soft still.  Baseline BP 45-364 systolic given low EF. ?- Hold Imdur for now ?- Hold home carvedilol for now, may resume this or metoprolol later ?- Hold Jardiance ?

## 2022-03-13 NOTE — Assessment & Plan Note (Addendum)
Glucose overall controlled. ?- Continue glargine ?- Continue sliding scale corrections ?- Hold home NPH and terzepitide   ?

## 2022-03-13 NOTE — Progress Notes (Signed)
Physical Therapy Treatment ?Patient Details ?Name: Eduardo [name] ?MRN: [id number] ?DOB: December 31, 1955 ?Today's Date: 03/13/2022 ? ? ?History of Present Illness 66 y/o M presenting to [facility] on 4/30 with sepsis with exposed necrotic bone and ulcer over the base of the 5th met. Also with cellulitis extending up the peroneal tendons. 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 History of CAD s/p triple-vessel CABG in 2017, HTN, HLD, T0GY, systolic HF, osteomyelitis and partial R foot amputation. ? ?  ?PT Comments  ? ? Pt making good progress with mobility and is motivated to work toward independence and return home. Continue to feel he is an excellent inpatient rehab pt and will reach a modified independent level with transfers if allowed to go to AIR.   ?Recommendations for follow up therapy are one component of a multi-disciplinary discharge planning process, led by the attending physician.  Recommendations may be updated based on patient status, additional functional criteria and insurance authorization. ? ?Follow Up Recommendations ? Acute inpatient rehab (3hours/day) ?  ?  ?Assistance Recommended at Discharge Intermittent Supervision/Assistance  ?Patient can return home with the following A lot of help with bathing/dressing/bathroom;Assistance with cooking/housework;Two people to help with walking and/or transfers;Help with stairs or ramp for entrance;Assist for transportation ?  ?Equipment Recommendations ? Wheelchair cushion (measurements PT);Wheelchair (measurements PT);BSC/3in1 (drop arm bsc)  ?  ?Recommendations for Other Services Rehab consult ? ? ?  ?Precautions / Restrictions Precautions ?Precautions: Other (comment) ?Precaution Comments: R BKA ?Restrictions ?Weight Bearing Restrictions: No  ?  ? ?Mobility ? Bed Mobility ?Overal bed mobility: Needs Assistance ?Bed Mobility: Supine to Sit ?  ?  ?Supine to sit: Min guard ?  ?  ?General bed mobility comments: Assist for safety and incr time but no physical assist ?  ? ?Transfers ?Overall  transfer level: Needs assistance ?Equipment used: Rolling walker (2 wheels), Ambulation equipment used ?Transfers: Sit to/from Stand, Bed to chair/wheelchair/BSC ?Sit to Stand: Mod assist, +2 physical assistance ?  ?  ?  ?  ?  ?General transfer comment: Assist to bring hips up and for balance/stability especially when bring hand from surface of chair up onto walker. Stood x 3 from chair with walker. Stood from bed with Stedy and used Stedy for bed to chair. ?Transfer via Lift Equipment: Stedy ? ?Ambulation/Gait ?Ambulation/Gait assistance: +2 physical assistance, Min assist ?Gait Distance (Feet): 0.5 Feet ?Assistive device: Rolling walker (2 wheels) ?Gait Pattern/deviations: Step-to pattern (hop to) ?  ?  ?  ?General Gait Details: Pt able to perform 2 small hops forward and 1 small hop forward with assist for balance and support ? ? ?Stairs ?  ?  ?  ?  ?  ? ? ?Wheelchair Mobility ?  ? ?Modified Rankin (Stroke Patients Only) ?  ? ? ?  ?Balance Overall balance assessment: Needs assistance ?Sitting-balance support: No upper extremity supported, Feet supported ?Sitting balance-Leahy Scale: Good ?  ?  ?Standing balance support: Bilateral upper extremity supported, Reliant on assistive device for balance ?Standing balance-Leahy Scale: Poor ?Standing balance comment: walker and min assist for static standing. Pt stood x 3 for 1-2 minutes ?  ?  ?  ?  ?  ?  ?  ?  ?  ?  ?  ?  ? ?  ?Cognition Arousal/Alertness: Awake/alert ?Behavior During Therapy: [facility] for tasks assessed/performed ?Overall Cognitive Status: Within Functional Limits for tasks assessed ?  ?  ?  ?  ?  ?  ?  ?  ?  ?  ?  ?  ?  ?  ?  ?  ?  ?  ?  ? ?  ?  Exercises Amputee Exercises ?Quad Sets: AROM, Right, 10 reps, Seated ?Gluteal Sets: AROM, Both, 10 reps, Seated ?Towel Squeeze: AROM, Both, 10 reps, Seated ?Hip ABduction/ADduction: AROM, Right, 10 reps, Seated ?Knee Flexion: AROM, Right, 10 reps, Seated ?Knee Extension: AROM, Right, 10 reps, Seated ?Straight Leg  Raises: AROM, Right, 10 reps, Seated ? ?  ?General Comments   ?  ?  ? ?Pertinent Vitals/Pain Pain Assessment ?Pain Assessment: 0-10 ?Pain Score: 5  ?Pain Location: rt residual limb ?Pain Descriptors / Indicators: Aching ?Pain Intervention(s): Limited activity within patient's tolerance, Monitored during session, Repositioned  ? ? ?Home Living   ?  ?  ?  ?  ?  ?  ?  ?  ?  ?   ?  ?Prior Function    ?  ?  ?   ? ?PT Goals (current goals can now be found in the care plan section) Acute Rehab PT Goals ?Patient Stated Goal: To return home ?Progress towards PT goals: Progressing toward goals ? ?  ?Frequency ? ? ? Min 4X/week ? ? ? ?  ?PT Plan Current plan remains appropriate  ? ? ?Co-evaluation   ?  ?  ?  ?  ? ?  ?AM-PAC PT "6 Clicks" Mobility   ?Outcome Measure ? Help needed turning from your back to your side while in a flat bed without using bedrails?: A Little ?Help needed moving from lying on your back to sitting on the side of a flat bed without using bedrails?: A Little ?Help needed moving to and from a bed to a chair (including a wheelchair)?: Total ?Help needed standing up from a chair using your arms (e.g., wheelchair or bedside chair)?: Total ?Help needed to walk in hospital room?: Total ?Help needed climbing 3-5 steps with a railing? : Total ?6 Click Score: 10 ? ?  ?End of Session Equipment Utilized During Treatment: Gait belt ?Activity Tolerance: Patient tolerated treatment well ?Patient left: in chair;with call bell/phone within reach;with chair alarm set ?Nurse Communication: Mobility status;Need for lift equipment ?PT Visit Diagnosis: Unsteadiness on feet (R26.81);Other abnormalities of gait and mobility (R26.89) ?  ? ? ?Time: 3013-1438 ?PT Time Calculation (min) (ACUTE ONLY): 28 min ? ?Charges:  $Therapeutic Activity: 23-37 mins          ?          ? ?[facility] PT ?Acute Rehabilitation Services ?Office [phone] ? ? ? ?Shary Decamp [facility] ?03/13/2022, 6:32 PM ? ?

## 2022-03-13 NOTE — Assessment & Plan Note (Addendum)
Blood cultures on admission grew Streptococcus anginosus, source possibly diabetic foot infection. ID consulted, who have narrowed to penicillin and recommend ruling out endocarditis. ?- Continue Penicillin, day 10 of antibitoics ?- Follow repeat cultures from 5/4 ?- Follow TTE results and if negative will obtain TEE from Dr. Einar Gip ?

## 2022-03-13 NOTE — Assessment & Plan Note (Addendum)
-   Continue Crestor, Zetia, gemfibrozil ?- Contineu aspirin, plavix ?

## 2022-03-13 NOTE — Hospital Course (Addendum)
Mr. Mcgrady is a 66 y.o. M with CAD s/p CABG 2017, PVD s/p R SFA stent 2020, DM, chronic heart failure EF 15-20%, chronic kidney disease and prior right partial foot amputation who presented with few days fever, chills, malaise and painful swollen right foot wound. ? ?In the ER he was started on peripheral Levophed and admitted to the ICU on vancomycin and Cefepime. ? ? ?4/30: Admitted to ICU ?5/1: Ortho consulted, noted exposed bone at base of 5th metatarsal and cellulitis up peroneal tendons ?5/2: MRI shows osteo, tenosynovitis; able to stop Levophed that night ?5/3: To OR for RIGHT BKA by Dr. Sharol Given with Martin Majestic, pressors resumed post-op ?5/4: Off pressors again ?5/5: +12.9L on admission, diuresis started ?5/6: Switched back to home oral diuretic ?5/8: ID consulted, TTE ordered, narrowed to penicillin ?

## 2022-03-13 NOTE — Assessment & Plan Note (Signed)
S/p right BKA by Dr. Sharol Given on 5/4 ?

## 2022-03-13 NOTE — Progress Notes (Signed)
Pt states that he is comfortable at this time and does not need the BiPAP tonight. He will call if he feels like he needs it. RT will continue to monitor. ?

## 2022-03-14 DIAGNOSIS — I5043 Acute on chronic combined systolic (congestive) and diastolic (congestive) heart failure: Secondary | ICD-10-CM | POA: Diagnosis not present

## 2022-03-14 DIAGNOSIS — E1165 Type 2 diabetes mellitus with hyperglycemia: Secondary | ICD-10-CM | POA: Diagnosis not present

## 2022-03-14 DIAGNOSIS — N179 Acute kidney failure, unspecified: Secondary | ICD-10-CM | POA: Diagnosis not present

## 2022-03-14 DIAGNOSIS — A419 Sepsis, unspecified organism: Secondary | ICD-10-CM | POA: Diagnosis not present

## 2022-03-14 LAB — GLUCOSE, CAPILLARY
Glucose-Capillary: 108 mg/dL — ABNORMAL HIGH (ref 70–99)
Glucose-Capillary: 202 mg/dL — ABNORMAL HIGH (ref 70–99)
Glucose-Capillary: 260 mg/dL — ABNORMAL HIGH (ref 70–99)
Glucose-Capillary: 124 mg/dL — ABNORMAL HIGH (ref 70–99)

## 2022-03-14 LAB — BASIC METABOLIC PANEL
Anion gap: 9 (ref 5–15)
BUN: 45 mg/dL — ABNORMAL HIGH (ref 8–23)
CO2: 23 mmol/L (ref 22–32)
Calcium: 8 mg/dL — ABNORMAL LOW (ref 8.9–10.3)
Chloride: 105 mmol/L (ref 98–111)
Creatinine, Ser: 1.72 mg/dL — ABNORMAL HIGH (ref 0.61–1.24)
GFR, Estimated: 43 mL/min — ABNORMAL LOW (ref >60)
Glucose, Bld: 129 mg/dL — ABNORMAL HIGH (ref 70–99)
Potassium: 4.2 mmol/L (ref 3.5–5.1)
Sodium: 137 mmol/L (ref 135–145)

## 2022-03-14 MED ORDER — INSULIN ASPART 100 UNIT/ML IJ SOLN
3.0000 [IU] | Freq: Three times a day (TID) | INTRAMUSCULAR | Status: DC
  Administered 2022-03-14 – 2022-03-21 (×21): 3 [IU] via SUBCUTANEOUS

## 2022-03-14 MED ORDER — TORSEMIDE 20 MG PO TABS
20.0000 mg | ORAL_TABLET | Freq: Two times a day (BID) | ORAL | Status: DC
  Administered 2022-03-14 – 2022-03-20 (×13): 20 mg via ORAL
  Filled 2022-03-14 (×14): qty 1

## 2022-03-14 MED ORDER — ENOXAPARIN SODIUM 60 MG/0.6ML IJ SOSY
60.0000 mg | PREFILLED_SYRINGE | INTRAMUSCULAR | Status: DC
  Administered 2022-03-14 – 2022-03-20 (×7): 60 mg via SUBCUTANEOUS
  Filled 2022-03-14 (×7): qty 0.6

## 2022-03-14 NOTE — Progress Notes (Signed)
?  Progress Note ? ? ?Patient: Eduardo [name] WVP:[id number] DOB: 1956-04-13 DOA: 03/08/2022     6 ?DOS: the patient was seen and examined on 03/14/2022 at 10:20AM ?  ? ? ? ?Brief hospital course: ?Eduardo [name] is a 66 y.o. M with CAD s/p CABG 2017, PVD s/p R SFA stent 2020, DM, chronic heart failure EF 15-20%, chronic kidney disease and prior right partial foot amputation who presented with few days fever, chills, malaise and painful swollen right foot wound. ? ?In the ER he was started on peripheral Levophed and admitted to the ICU on vancomycin and Cefepime. ? ? ?4/30: Admitted to ICU ?5/1: Ortho consulted, noted exposed bone at base of 5th metatarsal and cellulitis up peroneal tendons ?5/2: MRI shows osteo, tenosynovitis; able to stop Levophed that night ?5/3: To OR for RIGHT BKA by Dr. Sharol Given with Martin Majestic, pressors resumed post-op ?5/4: Off pressors again ?5/5: +12.9L on admission, diuresis started ? ? ? ? ?Assessment and Plan: ?* Streptococcal bacteremia ?Blood cultures on admission growing Streptococcus anginosus, source diabetic foot infection.  Low suspicion for endocarditis. ?- Follow repeat cultures ?- Continue Rocephin, day 7 of 14 ? ?Acute on chronic combined systolic and diastolic CHF (congestive heart failure) ([place]) ?Patient's baseline EF is less than 20% due to ischemic heart disease. ? ?Net negative 1.5L.  Had been >10L net positive at one point with chest x-ray showing bilateral edema, and cardiomegaly. ? ?Now on room air, orthopnea resolved.  Feeling better.  Cr imrpoved ?- Resume home torsemide ?- Strict ins and outs, daily BMP ?- Hold Entresto, Imdur until BP better ?- No longer taking Jardiance ? ? ?Essential hypertension, benign ?Blood pressure soft ?- Hold carvedilol, Imdur, Entresto, torsemide ? ?Controlled type 2 diabetes mellitus with hyperglycemia, with long-term current use of insulin ([place]) ?Glucose improving ?- Continue glargine ?- Continue sliding scale corrections ?- Hold home NPH and terzepitide    ? ?Coronary artery disease involving native coronary artery of native heart without angina pectoris ?- Continue Zetia, gemfibrozil, Crestor ?- Continue aspirin, Plavix ?- Hold Coreg, Imdur, Entresto, Jardiance ? ? ? ? ? ? ? ? ? ? ?Subjective: Breathing is better, slept well last night, orthopnea resolved, no swelling, pain is well controlled, had questions about insulin. ? ? ? ? ?Physical Exam: ?Vitals:  ? 03/13/22 2342 03/14/22 0452 03/14/22 0852 03/14/22 1056  ?BP: 117/70 (!) 112/54 (!) 131/92 131/72  ?Pulse: 100  100   ?Resp: '17 19 20 19  '$ ?Temp: 98.7 ?F (37.1 ?C) 98.9 ?F (37.2 ?C)  98.2 ?F (36.8 ?C)  ?TempSrc: Oral Oral  Oral  ?SpO2: 91% 96% 100% 94%  ?Weight:  128.6 kg    ?Height:      ? ?Adult male, lying in bed, no acute distress ?RRR, no murmurs, no peripheral edema ?Respiratory rate normal, lungs clear without rales or wheezes ?Attention normal, affect appropriate, judgment Syprine normal, face symmetric, moves upper extremities and was protecting coordinate ? ?Data Reviewed: ?Nursing notes reviewed, vital signs reviewed ?Patient metabolic panel reviewed, glucoses reviewed ? ?Family Communication:  ? ? ? ?Disposition: ?Status is: Inpatient ? ? ? ? ? ? ? ? ?Author: ?Edwin Dada, MD ?03/14/2022 3:16 PM ? ?For on call review [email or web address]  ? ? ?

## 2022-03-14 NOTE — Plan of Care (Signed)

## 2022-03-14 NOTE — Progress Notes (Signed)
Physical Therapy Treatment ?Patient Details ?Name: Eduardo [name] ?MRN: [id number] ?DOB: 1956/09/03 ?Today's Date: 03/14/2022 ? ? ?History of Present Illness 66 y/o M presenting to [facility] on 4/30 with sepsis with exposed necrotic bone and ulcer over the base of the 5th met. Also with cellulitis extending up the peroneal tendons. 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 History of CAD s/p triple-vessel CABG in 2017, HTN, HLD, A4TX, systolic HF, osteomyelitis and partial R foot amputation. ? ?  ?PT Comments  ? ? Pt was seen for work on pt initiating more with STS activity, and he is demonstrating better use of LLE in spite of weakness.  Pt is mostly relying on UE help which has hindered LE strengthening and limited his independence.  Follow up with him to work on height differences to practice transfers to assist with CIR vs home admission process.  Encouraged pt to do the exercises instructed up to 3 times a day for LE's, and he voices understanding of the plan.  CIR is still recommended due to his progress, his need for intensive strengthening to transfer and the plan for a prosthetic fitting at the point of appropriate healing.  Pt is going to be a good candidate based on current progress and motivation.   ?Recommendations for follow up therapy are one component of a multi-disciplinary discharge planning process, led by the attending physician.  Recommendations may be updated based on patient status, additional functional criteria and insurance authorization. ? ?Follow Up Recommendations ? Acute inpatient rehab (3hours/day) ?  ?  ?Assistance Recommended at Discharge Intermittent Supervision/Assistance  ?Patient can return home with the following A lot of help with walking and/or transfers;A little help with bathing/dressing/bathroom;Assistance with cooking/housework;Assist for transportation;Help with stairs or ramp for entrance ?  ?Equipment Recommendations ? Wheelchair cushion (measurements PT);Wheelchair (measurements PT);BSC/3in1  ?   ?Recommendations for Other Services Rehab consult ? ? ?  ?Precautions / Restrictions Precautions ?Precautions: Fall;Other (comment) ?Precaution Comments: R BKA ?Restrictions ?Weight Bearing Restrictions: Yes ?RLE Weight Bearing: Non weight bearing  ?  ? ?Mobility ? Bed Mobility ?  ?  ?  ?  ?  ?  ?  ?General bed mobility comments: up in chair when PT arrived ?  ? ?Transfers ?Overall transfer level: Needs assistance ?Equipment used: None ?Transfers: Sit to/from Stand ?Sit to Stand: Supervision, Min guard ?  ?  ?  ?  ?  ?General transfer comment: partial standing from the recliner in his room, able to lift hips from chair with LLE and BUE's ?  ? ?Ambulation/Gait ?  ?  ?  ?  ?  ?  ?  ?General Gait Details: deferred ? ? ?Stairs ?  ?  ?  ?  ?  ? ? ?Wheelchair Mobility ?  ? ?Modified Rankin (Stroke Patients Only) ?  ? ? ?  ?Balance   ?Sitting-balance support: Single extremity supported ?Sitting balance-Leahy Scale: Good ?  ?  ?Standing balance support: Bilateral upper extremity supported, Single extremity supported ?Standing balance-Leahy Scale: Poor ?  ?  ?  ?  ?  ?  ?  ?  ?  ?  ?  ?  ?  ? ?  ?Cognition Arousal/Alertness: Awake/alert ?Behavior During Therapy: [facility] for tasks assessed/performed ?Overall Cognitive Status: Within Functional Limits for tasks assessed ?  ?  ?  ?  ?  ?  ?  ?  ?  ?  ?  ?  ?  ?  ?  ?  ?General Comments: pt is pleasant  and interactive with therapy session ?  ?  ? ?  ?Exercises General Exercises - Lower Extremity ?Ankle Circles/Pumps: AROM, 5 reps ?Quad Sets: AROM, 10 reps ?Gluteal Sets: AROM, 10 reps ?Heel Slides: AROM, 10 reps ?Hip ABduction/ADduction: AROM, 10 reps ?Mini-Sqauts: AROM, 10 reps ?Amputee Exercises ?Towel Squeeze: Strengthening, 10 reps ?Chair Push Up: 10 reps ?Other Exercises ?Other Exercises: pt was instructed to rely on LLE more than UE's to push up to stand, and while he is weak, does verbalize understanding of the purpose of the ex ? ?  ?General Comments General comments (skin  integrity, edema, etc.): pt is up to chair when PT arrived and was able to assist with all standing practice and mobility.  Pt has a chair that does not allow for sliding laterally due to drop arm feature being broken ?  ?  ? ?Pertinent Vitals/Pain Pain Assessment ?Pain Assessment: Faces ?Faces Pain Scale: Hurts little more ?Pain Location: rt residual limb ?Pain Intervention(s): Limited activity within patient's tolerance, Monitored during session, Premedicated before session, Repositioned  ? ? ?Home Living   ?  ?  ?  ?  ?  ?  ?  ?  ?  ?   ?  ?Prior Function    ?  ?  ?   ? ?PT Goals (current goals can now be found in the care plan section) Acute Rehab PT Goals ?Patient Stated Goal: To return home ? ?  ?Frequency ? ? ? Min 4X/week ? ? ? ?  ?PT Plan Current plan remains appropriate  ? ? ?Co-evaluation   ?  ?  ?  ?  ? ?  ?AM-PAC PT "6 Clicks" Mobility   ?Outcome Measure ? Help needed turning from your back to your side while in a flat bed without using bedrails?: A Little ?Help needed moving from lying on your back to sitting on the side of a flat bed without using bedrails?: A Little ?Help needed moving to and from a bed to a chair (including a wheelchair)?: Total ?Help needed standing up from a chair using your arms (e.g., wheelchair or bedside chair)?: A Lot ?Help needed to walk in hospital room?: Total ?Help needed climbing 3-5 steps with a railing? : Total ?6 Click Score: 11 ? ?  ?End of Session Equipment Utilized During Treatment: Gait belt ?Activity Tolerance: Patient tolerated treatment well ?Patient left: in chair;with call bell/phone within reach;with chair alarm set ?Nurse Communication: Mobility status;Need for lift equipment ?PT Visit Diagnosis: Unsteadiness on feet (R26.81);Other abnormalities of gait and mobility (R26.89) ?  ? ? ?Time: 6384-5364 ?PT Time Calculation (min) (ACUTE ONLY): 31 min ? ?Charges:  $Therapeutic Exercise: 8-22 mins ?$Therapeutic Activity: 8-22 mins    ?Ramond Dial ?03/14/2022, 4:42  PM ? ?Mee Hives, PT PhD ?Acute Rehab Dept. Number: [facility] [phone] and [place] [phone] ? ? ?

## 2022-03-14 NOTE — Progress Notes (Signed)
Patient ID: Eduardo [name], male   DOB: May 06, 1956, 66 y.o.   MRN: [id number] ?Patient is postoperative day 3 right transtibial amputation.  Patient is feeling well he is sitting up in a chair.  There is no drainage in the wound VAC canister.  Anticipate discharge to skilled nursing.  Patient will need to discharge with the Praveena plus portable pump.  Continue with the stump shrinker.  Wear the limb protector when up with therapy. ?

## 2022-03-14 NOTE — Progress Notes (Signed)
Occupational Therapy Treatment ?Patient Details ?Name: Eduardo [name] ?MRN: [id number] ?DOB: 06/05/1956 ?Today's Date: 03/14/2022 ? ? ?History of present illness 66 y/o M presenting to [facility] on 4/30 with sepsis with exposed necrotic bone and ulcer over the base of the 5th met. Also with cellulitis extending up the peroneal tendons. 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 History of CAD s/p triple-vessel CABG in 2017, HTN, HLD, G8JE, systolic HF, osteomyelitis and partial R foot amputation. ?  ?OT comments ? Pt. Seen for skilled OT treatment session. Making gains with current goals.  Moving well with bed mobility.  Utilized stedy for transfer assistance per pt. Request as he did not feel up for lateral scoot transfer today.  Educated on chair push ups. Pt. With good return demo and plans to complete as part of HEP for cont. Strengthening.  Remains motivated for progress and an excellent candidate for AIR level therapies.    ? ?Recommendations for follow up therapy are one component of a multi-disciplinary discharge planning process, led by the attending physician.  Recommendations may be updated based on patient status, additional functional criteria and insurance authorization. ?   ?Follow Up Recommendations ? Acute inpatient rehab (3hours/day)  ?  ?Assistance Recommended at Discharge Frequent or constant Supervision/Assistance  ?Patient can return home with the following ? A lot of help with walking and/or transfers;A lot of help with bathing/dressing/bathroom;Assistance with cooking/housework;Assist for transportation;Help with stairs or ramp for entrance ?  ?Equipment Recommendations ? Wheelchair (measurements OT);Wheelchair cushion (measurements OT)  ?  ?Recommendations for Other Services Rehab consult ? ?  ?Precautions / Restrictions Precautions ?Precaution Comments: R BKA ?Restrictions ?Weight Bearing Restrictions: No  ? ? ?  ? ?Mobility Bed Mobility ?Overal bed mobility: Needs Assistance ?Bed Mobility: Supine to Sit ?  ?  ?Supine to  sit: Min guard ?  ?  ?General bed mobility comments: Assist for safety and incr time but no physical assist ?  ? ?Transfers ?Overall transfer level: Needs assistance ?Equipment used: Ambulation equipment used ?Transfers: Sit to/from Stand, Bed to chair/wheelchair/BSC ?Sit to Stand: Min guard (with use of stedy able to pull with b ues into standing) ?  ?  ?  ?  ?  ?General transfer comment: Stood from bed with Stedy and used Stedy for bed to chair. ?Transfer via Lift Equipment: Stedy ?  ?Balance   ?  ?  ?  ?  ?  ?  ?  ?  ?  ?  ?  ?  ?  ?  ?  ?  ?  ?  ?   ? ?ADL either performed or assessed with clinical judgement  ? ?ADL Overall ADL's : Needs assistance/impaired ?  ?  ?  ?  ?  ?  ?  ?  ?  ?  ?  ?  ?  ?  ?  ?  ?  ?  ?  ?General ADL Comments: very motivated. requested stedy states he wasnt sure if he could complete a lateral transfer this am.  good power from eob to standing in stedy, good contolled descend to chair ?  ? ?Extremity/Trunk Assessment   ?  ?  ?  ?  ?  ? ?Vision   ?  ?  ?Perception   ?  ?Praxis   ?  ? ?Cognition Arousal/Alertness: Awake/alert ?Behavior During Therapy: [facility] for tasks assessed/performed ?Overall Cognitive Status: Within Functional Limits for tasks assessed ?  ?  ?  ?  ?  ?  ?  ?  ?  ?  ?  ?  ?  ?  ?  ?  ?  ?  ?  ?   ?  Exercises Other Exercises ?Other Exercises: educated on chair push ups. pt. able to return demo set of 5.  states he felt them but it also got him "out of breath"  reviewed rec. for sets of 5 a few times a day. discussed benefits of ue strength, cardio component, and pressure relief with strengthening of LLE also. ? ?  ?Shoulder Instructions   ? ? ?  ?General Comments  Initial agitation because he states "they did not bring me what I want to eat for breakfast".  Assisted him with calling to place order and request the in room staff member come and take his order for all upcoming meals vs. Him calling each time.  He was receptive to this and was able to get the breakfast he  wanted.    ? ? ?Pertinent Vitals/ Pain       Pain Assessment ?Pain Assessment: No/denies pain ? ?Home Living   ?  ?  ?  ?  ?  ?  ?  ?  ?  ?  ?  ?  ?  ?  ?  ?  ?  ?  ? ?  ?Prior Functioning/Environment    ?  ?  ?  ?   ? ?Frequency ? Min 2X/week  ? ? ? ? ?  ?Progress Toward Goals ? ?OT Goals(current goals can now be found in the care plan section) ? Progress towards OT goals: Progressing toward goals ? ?   ?Plan Discharge plan remains appropriate   ? ?Co-evaluation ? ? ?   ?  ?  ?  ?  ? ?  ?AM-PAC OT "6 Clicks" Daily Activity     ?Outcome Measure ? ? Help from another person eating meals?: None ?Help from another person taking care of personal grooming?: A Little ?Help from another person toileting, which includes using toliet, bedpan, or urinal?: A Lot ?Help from another person bathing (including washing, rinsing, drying)?: A Lot ?Help from another person to put on and taking off regular upper body clothing?: A Little ?Help from another person to put on and taking off regular lower body clothing?: A Lot ?6 Click Score: 16 ? ?  ?End of Session Equipment Utilized During Treatment: Other (comment) (stedy) ? ?OT Visit Diagnosis: Unsteadiness on feet (R26.81);Other abnormalities of gait and mobility (R26.89);Muscle weakness (generalized) (M62.81) ?  ?Activity Tolerance Patient tolerated treatment well ?  ?Patient Left in chair;with call bell/phone within reach ?  ?Nurse Communication Other (comment) (pt.alerted rn R forearm with scratch. some dried blood noted upon arrival but also some light bleeding active as well. reivewed no chair alarm but she could turn on if she felt indicated.) ?  ? ?   ? ?Time: 1117-3567 ?OT Time Calculation (min): 24 min ? ?Charges: OT General Charges ?$OT Visit: 1 Visit ?OT Treatments ?$Self Care/Home Management : 8-22 mins ?$Therapeutic Exercise: 8-22 mins ? ?Sonia Baller, COTA/L ?Acute Rehabilitation ?[phone]  ? ?Tanya Nones ?03/14/2022, 11:02 AM ?

## 2022-03-15 DIAGNOSIS — E1165 Type 2 diabetes mellitus with hyperglycemia: Secondary | ICD-10-CM | POA: Diagnosis not present

## 2022-03-15 DIAGNOSIS — N179 Acute kidney failure, unspecified: Secondary | ICD-10-CM | POA: Diagnosis not present

## 2022-03-15 DIAGNOSIS — I5043 Acute on chronic combined systolic (congestive) and diastolic (congestive) heart failure: Secondary | ICD-10-CM | POA: Diagnosis not present

## 2022-03-15 DIAGNOSIS — A419 Sepsis, unspecified organism: Secondary | ICD-10-CM | POA: Diagnosis not present

## 2022-03-15 LAB — BASIC METABOLIC PANEL
Anion gap: 9 (ref 5–15)
BUN: 38 mg/dL — ABNORMAL HIGH (ref 8–23)
CO2: 25 mmol/L (ref 22–32)
Calcium: 8.3 mg/dL — ABNORMAL LOW (ref 8.9–10.3)
Chloride: 106 mmol/L (ref 98–111)
Creatinine, Ser: 1.66 mg/dL — ABNORMAL HIGH (ref 0.61–1.24)
GFR, Estimated: 45 mL/min — ABNORMAL LOW (ref >60)
Glucose, Bld: 158 mg/dL — ABNORMAL HIGH (ref 70–99)
Potassium: 4 mmol/L (ref 3.5–5.1)
Sodium: 140 mmol/L (ref 135–145)

## 2022-03-15 LAB — GLUCOSE, CAPILLARY
Glucose-Capillary: 183 mg/dL — ABNORMAL HIGH (ref 70–99)
Glucose-Capillary: 246 mg/dL — ABNORMAL HIGH (ref 70–99)
Glucose-Capillary: 246 mg/dL — ABNORMAL HIGH (ref 70–99)
Glucose-Capillary: 151 mg/dL — ABNORMAL HIGH (ref 70–99)

## 2022-03-15 MED ORDER — SODIUM CHLORIDE 0.9 % IV SOLN
INTRAVENOUS | Status: DC | PRN
Start: 2022-03-15 — End: 2022-03-21

## 2022-03-15 NOTE — Plan of Care (Signed)
  Problem: Activity: Goal: Risk for activity intolerance will decrease Outcome: Progressing   Problem: Elimination: Goal: Will not experience complications related to bowel motility Outcome: Progressing   Problem: Safety: Goal: Ability to remain free from injury will improve Outcome: Progressing   

## 2022-03-15 NOTE — Progress Notes (Signed)
?  Progress Note ? ? ?Patient: Eduardo [name] WPY:[id number] DOB: May 14, 1956 DOA: 03/08/2022     7 ?DOS: the patient was seen and examined on 03/15/2022 at 8:38AM ?  ? ? ? ?Brief hospital course: ?Mr. Zeck is a 66 y.o. M with CAD s/p CABG 2017, PVD s/p R SFA stent 2020, DM, chronic heart failure EF 15-20%, chronic kidney disease and prior right partial foot amputation who presented with few days fever, chills, malaise and painful swollen right foot wound. ? ?In the ER he was started on peripheral Levophed and admitted to the ICU on vancomycin and Cefepime. ? ? ?4/30: Admitted to ICU ?5/1: Ortho consulted, noted exposed bone at base of 5th metatarsal and cellulitis up peroneal tendons ?5/2: MRI shows osteo, tenosynovitis; able to stop Levophed that night ?5/3: To OR for RIGHT BKA by Dr. Sharol Given with Martin Majestic, pressors resumed post-op ?5/4: Off pressors again ?5/5: +12.9L on admission, diuresis started ?5/6: Switched back to home oral diuretic ? ? ? ? ? ? ? ?Assessment and Plan: ?*  Streptococcal bacteremia ?- Continue Rocephin, day 8 of 14 ? ?Acute on chronic combined systolic and diastolic CHF (congestive heart failure) ([place]) ?Patient's baseline EF is less than 20% due to ischemic heart disease. ? ?Net negative 3.2L yesterday.   ?Symptoms resolved ?- Continue home torsemide ?- Strict ins and outs, daily BMP ?- Hold Entresto, Imdur until BP better ?- No longer taking Jardiance ? ? ?essential hypertension, benign ?Blood pressure soft ?- Hold carvedilol, Imdur, Entresto ? ?Controlled type 2 diabetes mellitus with hyperglycemia, with long-term current use of insulin ([place]) ?Glucose improving ?- Continue glargine ?- Continue sliding scale corrections ?- Hold home NPH and terzepitide   ? ? ?Coronary artery disease involving native coronary artery of native heart without angina pectoris ?- Continue Zetia, gemfibrozil, Crestor ?- Continue aspirin, Plavix ?- Hold Coreg, Imdur, Entresto, Jardiance ? ? ? ? ? ? ? ? ? ?Subjective: Patient  is feeling well, he has no orthopnea.  He is bored.  No chest pain or dyspnea.  Patient is amputation is well controlled. ? ? ? ? ?Physical Exam: ?Vitals:  ? 03/14/22 1914 03/15/22 0330 03/15/22 0838 03/15/22 1108  ?BP: 120/74 116/67 104/84 114/72  ?Pulse:  90  97  ?Resp: 19   18  ?Temp: 98.8 ?F (37.1 ?C) 98.5 ?F (36.9 ?C)  98.7 ?F (37.1 ?C)  ?TempSrc: Oral   Oral  ?SpO2:  94%  95%  ?Weight:  124.9 kg    ?Height:      ? ?Adult male, sitting up in bed, no obvious distress, interactive ?RRR, no murmurs, no peripheral edema ?Respiratory rate normal, lungs clear without rales or wheezes ?Attention normal, affect appropriate, judgment and insight appear normal, face symmetric, moves upper extremities with normal strength and coordination ? ?Data Reviewed: ?Nursing notes reviewed, vital signs reviewed ?Basic metabolic panel reviewed, glucose reviewed ?  ? ? ?Disposition: ?Status is: Inpatient ? ? ? ? ? ? ? ? ?Author: ?Edwin Dada, MD ?03/15/2022 12:22 PM ? ?For on call review [email or web address]  ? ? ?

## 2022-03-16 ENCOUNTER — Inpatient Hospital Stay (HOSPITAL_COMMUNITY): Payer: Medicare Other

## 2022-03-16 ENCOUNTER — Encounter (HOSPITAL_COMMUNITY): Payer: Self-pay | Admitting: Orthopedic Surgery

## 2022-03-16 DIAGNOSIS — I10 Essential (primary) hypertension: Secondary | ICD-10-CM | POA: Diagnosis not present

## 2022-03-16 DIAGNOSIS — N1832 Chronic kidney disease, stage 3b: Secondary | ICD-10-CM

## 2022-03-16 DIAGNOSIS — E1165 Type 2 diabetes mellitus with hyperglycemia: Secondary | ICD-10-CM | POA: Diagnosis not present

## 2022-03-16 DIAGNOSIS — I5043 Acute on chronic combined systolic (congestive) and diastolic (congestive) heart failure: Secondary | ICD-10-CM | POA: Diagnosis not present

## 2022-03-16 DIAGNOSIS — B955 Unspecified streptococcus as the cause of diseases classified elsewhere: Secondary | ICD-10-CM

## 2022-03-16 DIAGNOSIS — R7881 Bacteremia: Secondary | ICD-10-CM

## 2022-03-16 DIAGNOSIS — I2510 Atherosclerotic heart disease of native coronary artery without angina pectoris: Secondary | ICD-10-CM | POA: Diagnosis not present

## 2022-03-16 LAB — CBC
HCT: 38.2 % — ABNORMAL LOW (ref 39.0–52.0)
Hemoglobin: 12.9 g/dL — ABNORMAL LOW (ref 13.0–17.0)
MCH: 30.2 pg (ref 26.0–34.0)
MCHC: 33.8 g/dL (ref 30.0–36.0)
MCV: 89.5 fL (ref 80.0–100.0)
Platelets: 318 10*3/uL (ref 150–400)
RBC: 4.27 MIL/uL (ref 4.22–5.81)
RDW: 13.8 % (ref 11.5–15.5)
WBC: 8.9 10*3/uL (ref 4.0–10.5)
nRBC: 0 % (ref 0.0–0.2)

## 2022-03-16 LAB — GLUCOSE, CAPILLARY
Glucose-Capillary: 130 mg/dL — ABNORMAL HIGH (ref 70–99)
Glucose-Capillary: 169 mg/dL — ABNORMAL HIGH (ref 70–99)
Glucose-Capillary: 204 mg/dL — ABNORMAL HIGH (ref 70–99)
Glucose-Capillary: 155 mg/dL — ABNORMAL HIGH (ref 70–99)

## 2022-03-16 LAB — BASIC METABOLIC PANEL
Anion gap: 9 (ref 5–15)
BUN: 34 mg/dL — ABNORMAL HIGH (ref 8–23)
CO2: 25 mmol/L (ref 22–32)
Calcium: 8.2 mg/dL — ABNORMAL LOW (ref 8.9–10.3)
Chloride: 105 mmol/L (ref 98–111)
Creatinine, Ser: 1.61 mg/dL — ABNORMAL HIGH (ref 0.61–1.24)
GFR, Estimated: 47 mL/min — ABNORMAL LOW (ref >60)
Glucose, Bld: 120 mg/dL — ABNORMAL HIGH (ref 70–99)
Potassium: 3.8 mmol/L (ref 3.5–5.1)
Sodium: 139 mmol/L (ref 135–145)

## 2022-03-16 LAB — SURGICAL PATHOLOGY

## 2022-03-16 MED ORDER — SENNOSIDES-DOCUSATE SODIUM 8.6-50 MG PO TABS
2.0000 | ORAL_TABLET | Freq: Two times a day (BID) | ORAL | Status: DC | PRN
  Administered 2022-03-16 – 2022-03-20 (×3): 2 via ORAL
  Filled 2022-03-16 (×2): qty 2

## 2022-03-16 MED ORDER — SACUBITRIL-VALSARTAN 24-26 MG PO TABS
1.0000 | ORAL_TABLET | Freq: Two times a day (BID) | ORAL | Status: DC
  Administered 2022-03-17 – 2022-03-21 (×7): 1 via ORAL
  Filled 2022-03-16 (×8): qty 1

## 2022-03-16 MED ORDER — PERFLUTREN LIPID MICROSPHERE
1.0000 mL | INTRAVENOUS | Status: AC | PRN
  Administered 2022-03-16: 3 mL via INTRAVENOUS
  Filled 2022-03-16: qty 10

## 2022-03-16 MED ORDER — PENICILLIN G POTASSIUM 20000000 UNITS IJ SOLR
12.0000 10*6.[IU] | Freq: Two times a day (BID) | INTRAVENOUS | Status: DC
  Administered 2022-03-17 – 2022-03-18 (×2): 12 10*6.[IU] via INTRAVENOUS
  Filled 2022-03-16 (×3): qty 12

## 2022-03-16 NOTE — Progress Notes (Signed)
Physical Therapy Treatment ?Patient Details ?Name: Eduardo [name] ?MRN: [id number] ?DOB: 1956/09/06 ?Today's Date: 03/16/2022 ? ? ?History of Present Illness 66 y/o M presenting to [facility] on 4/30 with sepsis with exposed necrotic bone and ulcer over the base of the 5th met. Also with cellulitis extending up the peroneal tendons. 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 History of CAD s/p triple-vessel CABG in 2017, HTN, HLD, G8JE, systolic HF, osteomyelitis and partial R foot amputation. ? ?  ?PT Comments  ? ? The pt was agreeable to session with continued focus on progressing LE strength and transfer ability. He progressed within the session from [place] of 2 to minA of 1-2 to complete sit-stand from EOB and recliner, and was able to initiate hopping steps with LLE and BUE support on RW. The pt will continue to benefit from skilled PT to progress functional power and stability to allow for greater independence with transfers and mobility at home.  ?   ?Recommendations for follow up therapy are one component of a multi-disciplinary discharge planning process, led by the attending physician.  Recommendations may be updated based on patient status, additional functional criteria and insurance authorization. ? ?Follow Up Recommendations ? Acute inpatient rehab (3hours/day) ?  ?  ?Assistance Recommended at Discharge Intermittent Supervision/Assistance  ?Patient can return home with the following A lot of help with walking and/or transfers;A little help with bathing/dressing/bathroom;Assistance with cooking/housework;Assist for transportation;Help with stairs or ramp for entrance ?  ?Equipment Recommendations ? Wheelchair cushion (measurements PT);Wheelchair (measurements PT);BSC/3in1  ?  ?Recommendations for Other Services   ? ? ?  ?Precautions / Restrictions Precautions ?Precautions: Fall;Other (comment) ?Precaution Comments: R BKA ?Required Braces or Orthoses: Other Brace ?Other Brace: limb protector ?Restrictions ?Weight Bearing Restrictions:  Yes ?RLE Weight Bearing: Non weight bearing  ?  ? ?Mobility ? Bed Mobility ?Overal bed mobility: Needs Assistance ?Bed Mobility: Supine to Sit ?  ?  ?Supine to sit: Supervision ?  ?  ?General bed mobility comments: using bed rails, slightly increased time ?  ? ?Transfers ?Overall transfer level: Needs assistance ?Equipment used: Rolling walker (2 wheels) ?Transfers: Sit to/from Stand, Bed to chair/wheelchair/BSC ?Sit to Stand: Mod assist, Min assist, +2 physical assistance ?  ?Step pivot transfers: Min assist ?  ?  ?  ?General transfer comment: modA initially, progressed to minA with reps. x6 from EOB and x1 from recliner. improved with use of LUE pushing from bed and RUE on RW. minA to steady and manage RW stability ?  ? ?Ambulation/Gait ?  ?  ?  ?  ?  ?  ?  ?General Gait Details: limited to small hops with RW from EOB to recliner ? ? ? ?  ?Balance Overall balance assessment: Needs assistance ?Sitting-balance support: No upper extremity supported ?Sitting balance-Leahy Scale: Good ?  ?  ?Standing balance support: Bilateral upper extremity supported, Single extremity supported ?Standing balance-Leahy Scale: Poor ?Standing balance comment: walker and minA to steady ?  ?  ?  ?  ?  ?  ?  ?  ?  ?  ?  ?  ? ?  ?Cognition Arousal/Alertness: Awake/alert ?Behavior During Therapy: [facility] for tasks assessed/performed ?Overall Cognitive Status: Within Functional Limits for tasks assessed ?  ?  ?  ?  ?  ?  ?  ?  ?  ?  ?  ?  ?  ?  ?  ?  ?  ?  ?  ? ?  ?Exercises Other Exercises ?Other Exercises: repeated sit-stand from  EOB x3 with RUE on bed, x3 with LUE on bed ? ?  ?General Comments General comments (skin integrity, edema, etc.): VSS on RA ?  ?  ? ?Pertinent Vitals/Pain Pain Assessment ?Pain Assessment: No/denies pain ?Pain Location: rt residual limb ?Pain Intervention(s): Limited activity within patient's tolerance, Monitored during session  ? ? ? ?PT Goals (current goals can now be found in the care plan section) Acute Rehab PT  Goals ?Patient Stated Goal: To return home ?PT Goal Formulation: With patient ?Time For Goal Achievement: 03/26/22 ?Potential to Achieve Goals: Good ?Progress towards PT goals: Progressing toward goals ? ?  ?Frequency ? ? ? Min 4X/week ? ? ? ?  ?PT Plan Current plan remains appropriate  ? ? ?Co-evaluation PT/OT/SLP Co-Evaluation/Treatment: Yes ?Reason for Co-Treatment: For patient/therapist safety;To address functional/ADL transfers ?PT goals addressed during session: Mobility/safety with mobility;Balance;Strengthening/ROM ?  ?  ? ?  ?AM-PAC PT "6 Clicks" Mobility   ?Outcome Measure ? Help needed turning from your back to your side while in a flat bed without using bedrails?: A Little ?Help needed moving from lying on your back to sitting on the side of a flat bed without using bedrails?: A Little ?Help needed moving to and from a bed to a chair (including a wheelchair)?: A Lot ?Help needed standing up from a chair using your arms (e.g., wheelchair or bedside chair)?: A Lot ?Help needed to walk in hospital room?: Total ?Help needed climbing 3-5 steps with a railing? : Total ?6 Click Score: 12 ? ?  ?End of Session Equipment Utilized During Treatment: Gait belt ?Activity Tolerance: Patient tolerated treatment well ?Patient left: in chair;with call bell/phone within reach;with chair alarm set ?Nurse Communication: Mobility status;Need for lift equipment ?PT Visit Diagnosis: Unsteadiness on feet (R26.81);Other abnormalities of gait and mobility (R26.89) ?  ? ? ?Time: 5784-6962 ?PT Time Calculation (min) (ACUTE ONLY): 35 min ? ?Charges:  $Therapeutic Exercise: 8-22 mins          ?          ? ?West Carbo, PT, DPT  ? ?Acute Rehabilitation Department ?Pager #: [phone] ? ? ?Sandra Cockayne ?03/16/2022, 3:53 PM ? ?

## 2022-03-16 NOTE — Progress Notes (Signed)
Inpatient Rehab Admissions Coordinator:  ? ?Awaiting insurance determination regarding CIR prior auth request.  ? ?Shann Medal, PT, DPT ?Admissions Coordinator ?[phone] ?03/16/22  ?9:03 AM ? ?

## 2022-03-16 NOTE — TOC Progression Note (Signed)
Transition of Care (TOC) - Progression Note  ? ? ?Patient Details  ?Name: [name] ?MRN: [id number] ?Date of Birth: March 05, 1956 ? ?Transition of Care (TOC) CM/SW Contact  ?Zenon Mayo, RN ?Phone Number: ?03/16/2022, 10:25 AM ? ?Clinical Narrative:    ?Plan for CIR, awaiting CIR auth.  ? ? ?  ?  ? ?Expected Discharge Plan and Services ?  ?  ?  ?  ?  ?                ?  ?  ?  ?  ?  ?  ?  ?  ?  ?  ? ? ?Social Determinants of Health (SDOH) Interventions ?  ? ?Readmission Risk Interventions ?   ? View : No data to display.  ?  ?  ?  ? ? ?

## 2022-03-16 NOTE — Progress Notes (Signed)
Occupational Therapy Treatment ?Patient Details ?Name: Eduardo [name] ?MRN: [id number] ?DOB: Jan 04, 1956 ?Today's Date: 03/16/2022 ? ? ?History of present illness 66 y/o M presenting to [facility] on 4/30 with sepsis with exposed necrotic bone and ulcer over the base of the 5th met. Also with cellulitis extending up the peroneal tendons. 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 History of CAD s/p triple-vessel CABG in 2017, HTN, HLD, E2AS, systolic HF, osteomyelitis and partial R foot amputation. ?  ?OT comments ? Patient continues to make steady progress towards goals in skilled OT session. Patient's session encompassed  sit<>stand transfers, education and reiteration of exercises to aid in R leg strengthening, and increasing activity tolerance. Patient continues to make excellent progress towards goals, able to take a few hops with RW to complete stand pivot to recliner after multiple bouts of sit<>stands. Patient min A of 2 to complete hops to chair, and progressing from mod A of 2 to min A of 2 to complete 6 sit<>stands in session. Continue to highly recommend AIR placement due to motivation, ability to make excellent gains in rehab, and prior level of function. OT will continue to follow acutely.   ? ?Recommendations for follow up therapy are one component of a multi-disciplinary discharge planning process, led by the attending physician.  Recommendations may be updated based on patient status, additional functional criteria and insurance authorization. ?   ?Follow Up Recommendations ? Acute inpatient rehab (3hours/day)  ?  ?Assistance Recommended at Discharge Frequent or constant Supervision/Assistance  ?Patient can return home with the following ? A lot of help with walking and/or transfers;A lot of help with bathing/dressing/bathroom;Assistance with cooking/housework;Assist for transportation;Help with stairs or ramp for entrance ?  ?Equipment Recommendations ? Wheelchair (measurements OT);Wheelchair cushion (measurements OT)  ?   ?Recommendations for Other Services   ? ?  ?Precautions / Restrictions Precautions ?Precautions: Fall;Other (comment) ?Precaution Comments: R BKA ?Required Braces or Orthoses: Other Brace ?Other Brace: limb protector ?Restrictions ?Weight Bearing Restrictions: Yes ?RLE Weight Bearing: Non weight bearing  ? ? ?  ? ?Mobility Bed Mobility ?Overal bed mobility: Needs Assistance ?Bed Mobility: Supine to Sit ?  ?  ?Supine to sit: Supervision ?  ?  ?General bed mobility comments: using bed rails, slightly increased time ?  ? ?Transfers ?Overall transfer level: Needs assistance ?Equipment used: Rolling walker (2 wheels) ?Transfers: Sit to/from Stand, Bed to chair/wheelchair/BSC ?Sit to Stand: Mod assist, Min assist, +2 physical assistance ?  ?  ?Step pivot transfers: Min assist ?  ?  ?General transfer comment: modA initially, progressed to minA with reps. x6 from EOB and x1 from recliner. improved with use of LUE pushing from bed and RUE on RW. minA to steady and manage RW stability ?  ?  ?Balance Overall balance assessment: Needs assistance ?Sitting-balance support: No upper extremity supported ?Sitting balance-Leahy Scale: Good ?  ?  ?Standing balance support: Bilateral upper extremity supported, Single extremity supported ?Standing balance-Leahy Scale: Poor ?Standing balance comment: walker and minA to steady ?  ?  ?  ?  ?  ?  ?  ?  ?  ?  ?  ?   ? ?ADL either performed or assessed with clinical judgement  ? ?ADL Overall ADL's : Needs assistance/impaired ?  ?  ?  ?  ?  ?  ?  ?  ?  ?  ?  ?  ?Toilet Transfer: Minimal assistance;+2 for physical assistance;+2 for safety/equipment;Stand-pivot ?  ?  ?  ?  ?  ?Functional mobility  during ADLs: Minimal assistance;+2 for physical assistance;+2 for safety/equipment;Rolling walker (2 wheels) ?General ADL Comments: Patient continues to make excellent progress towards goals, able to take a few hops with RW to complete stand pivot to recliner after multiple bouts of sit<>stands ?   ? ?Extremity/Trunk Assessment   ?  ?  ?  ?  ?  ? ?Vision   ?  ?  ?Perception   ?  ?Praxis   ?  ? ?Cognition Arousal/Alertness: Awake/alert ?Behavior During Therapy: [facility] for tasks assessed/performed ?Overall Cognitive Status: Within Functional Limits for tasks assessed ?  ?  ?  ?  ?  ?  ?  ?  ?  ?  ?  ?  ?  ?  ?  ?  ?  ?  ?  ?   ?Exercises   ? ?  ?Shoulder Instructions   ? ? ?  ?General Comments VSS on RA  ? ? ?Pertinent Vitals/ Pain       Pain Assessment ?Pain Assessment: Faces ?Faces Pain Scale: Hurts little more ?Pain Location: rt residual limb ?Pain Descriptors / Indicators: Aching, Discomfort ?Pain Intervention(s): Limited activity within patient's tolerance, Monitored during session, Repositioned ? ?Home Living Family/patient expects to be discharged to:: Private residence ?  ?  ?  ?  ?  ?  ?  ?  ?  ?  ?  ?  ?  ?  ?  ?  ?  ?  ? ?  ?Prior Functioning/Environment    ?  ?  ?  ?   ? ?Frequency ? Min 2X/week  ? ? ? ? ?  ?Progress Toward Goals ? ?OT Goals(current goals can now be found in the care plan section) ? Progress towards OT goals: Progressing toward goals ? ?Acute Rehab OT Goals ?Patient Stated Goal: to get to rehab ?OT Goal Formulation: With patient ?Time For Goal Achievement: 03/26/22 ?Potential to Achieve Goals: Good  ?Plan Discharge plan remains appropriate   ? ?Co-evaluation ? ? ? PT/OT/SLP Co-Evaluation/Treatment: Yes ?Reason for Co-Treatment: For patient/therapist safety;To address functional/ADL transfers ?PT goals addressed during session: Mobility/safety with mobility;Balance;Strengthening/ROM ?OT goals addressed during session: ADL's and self-care;Strengthening/ROM;Proper use of [employer]ptive equipment and DME ?  ? ?  ?AM-PAC OT "6 Clicks" Daily Activity     ?Outcome Measure ? ? Help from another person eating meals?: None ?Help from another person taking care of personal grooming?: A Little ?Help from another person toileting, which includes using toliet, bedpan, or urinal?: A Lot ?Help from another  person bathing (including washing, rinsing, drying)?: A Lot ?Help from another person to put on and taking off regular upper body clothing?: A Little ?Help from another person to put on and taking off regular lower body clothing?: A Lot ?6 Click Score: 16 ? ?  ?End of Session Equipment Utilized During Treatment: Rolling walker (2 wheels);Gait belt ? ?OT Visit Diagnosis: Unsteadiness on feet (R26.81);Other abnormalities of gait and mobility (R26.89);Muscle weakness (generalized) (M62.81) ?  ?Activity Tolerance Patient tolerated treatment well ?  ?Patient Left in chair;with call bell/phone within reach;with chair alarm set ?  ?Nurse Communication Mobility status ?  ? ?   ? ?Time: 4709-6283 ?OT Time Calculation (min): 34 min ? ?Charges: OT General Charges ?$OT Visit: 1 Visit ?OT Treatments ?$Self Care/Home Management : 8-22 mins ? ?Corinne Ports E. Nury Nebergall, OTR/L ?Acute Rehabilitation Services ?[phone] ?[phone]  ? ?Corinne Ports Jazleen Robeck ?03/16/2022, 4:08 PM ?

## 2022-03-16 NOTE — Care Management Important Message (Signed)
Important Message ? ?Patient Details  ?Name: Eduardo [name] ?MRN: [id number] ?Date of Birth: 1955/11/18 ? ? ?Medicare Important Message Given:  Yes ? ? ? ? ?Shelda Altes ?03/16/2022, 8:20 AM ?

## 2022-03-16 NOTE — Consult Note (Signed)
[facility]                                                                                        Patient Identification: Patient Name: Eduardo [name] MRN: [id number] Admit Date: 03/08/2022  1:47 PM Today's Date: 03/16/2022 Reason for consult: Streptococcal bacteremia Requesting provider: Joen Laura  Principal Problem:   Septic shock [facility]) Active Problems:   Morbid obesity with BMI of 40.0-44.9, adult (HCC)   PAD (peripheral artery disease) (HCC)   Coronary artery disease involving native coronary artery of native heart without angina pectoris   Stage 3b chronic kidney disease (CKD) (HCC)   Controlled type 2 diabetes mellitus with hyperglycemia, with long-term current use of insulin (HCC)   Essential hypertension, benign   Osteomyelitis of right foot (HCC)   Acute on chronic combined systolic and diastolic CHF (congestive heart failure) (HCC)   AKI (acute kidney injury) (HCC)   Streptococcal bacteremia   Hypophosphatemia   Antibiotics:  Vancomycin 4/30- Cefepime 4/30-5/3, ceftriaxone 5/4-c Metronidazole 4/30  Lines/Hardware:  Assessment # Streptococcus anginosus bacteremia: Likely source is right foot. 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 Please call with questions or concerns.  Thank you for the consult  Odette Fraction, MD Infectious Disease Physician [facility] [address] [place], [place] [address] Phone: [phone]  Fax:  [phone]  __________________________________________________________________________________________________________ HPI and Hospital Course: 66 year old male with PMH of CKD, CAD s/p CHF, NSTEMI, DM complicated with retinopathy, morbid obesity, HTN, Dyslipidemia, PAD s/p right fifth ray amputation who presented to the ED on 4/30 with fevers, feeling poorly, intermittent fevers and a nonhealing wound on the right foot.  Had chills and rigors on day of ED presentation  At ED hypoxic, tachypneic, tachycardic, hypotensive and febrile  Hypotensive despite IV fluids and requiring IV vasopressors Labs remarkable for AKI wih cr 2.01, WBC 14.5  Chest x-ray 4/30 with no acute cardiopulmonary abnormalities Foot x-ray 4/30 ulcer identified at lateral margin of the mid right foot with associated bone destruction at the fifth metatarsal base consistent with osteomyelitis  MRI right ankle 5/2 with findings as below S/p right leg BKA on 5/3  4/30 blood cx 2/2 sets strep anginosus  ID consulted for strep anginosus bacteremia  ROS: all systems reviewed with pertinent positives and negatives as listed above  Past Medical History:  Diagnosis Date   CHF (congestive heart failure) (HCC)    Chronic kidney disease    stage 3   Colon polyp    Coronary artery disease    Diabetes mellitus 1987   under care of Dr. Talmage Nap.  On insulin since 96 (off and on)   Diabetic retinopathy    Dupuytren contracture    R hand, s/p injection (Dr. Izora Ribas)   Essential hypertension,  benign    Essential hypertension, benign 02/06/2019   Frequency of urination and polyuria    Hypertension    Myocardial infarction [facility])    denies   Neuromuscular disorder (HCC)    Diabetic neuropathy   Osteomyelitis (HCC)    right foot   Other testicular hypofunction    Peripheral arterial disease (HCC) 10/28/2012   Peritoneal abscess (HCC) 6/08   and buttock.   Pneumonia    Polydipsia    Proteinuria    Pure hyperglyceridemia     Subacute osteomyelitis, right ankle and foot (HCC)    Wears glasses    Past Surgical History:  Procedure Laterality Date   ABDOMINAL AORTAGRAM N/A 04/18/2012   Procedure: ABDOMINAL Ronny Flurry;  Surgeon: Chuck Hint, MD;  Location: [facility] CATH LAB;  Service: Cardiovascular;  Laterality: N/A;   AMPUTATION Right 05/19/2019   Procedure: RIGHT FOOT 5TH RAY AMPUTATION;  Surgeon: Nadara Mustard, MD;  Location: [facility] OR;  Service: Orthopedics;  Laterality: Right;   CARDIAC CATHETERIZATION N/A 09/08/2016   Procedure: Left Heart Cath and Coronary Angiography;  Surgeon: Yates Decamp, MD;  Location: [facility] INVASIVE CV LAB;  Service: Cardiovascular;  Laterality: N/A;   CATARACT EXTRACTION, BILATERAL  09/2017, 10/2017   Dr. Elmer Picker   COLONOSCOPY W/ BIOPSIES AND POLYPECTOMY     CORONARY/GRAFT ACUTE MI REVASCULARIZATION N/A 10/11/2020   Procedure: Coronary/Graft Acute MI Revascularization;  Surgeon: Yates Decamp, MD;  Location: [facility] INVASIVE CV LAB;  Service: Cardiovascular;  Laterality: N/A;   LEFT HEART CATH N/A 12/31/2020   Procedure: Left Heart Cath;  Surgeon: Yates Decamp, MD;  Location: [facility] INVASIVE CV LAB;  Service: Cardiovascular;  Laterality: N/A;   LEFT HEART CATH AND CORONARY ANGIOGRAPHY N/A 10/11/2020   Procedure: LEFT HEART CATH AND CORONARY ANGIOGRAPHY;  Surgeon: Yates Decamp, MD;  Location: [facility] INVASIVE CV LAB;  Service: Cardiovascular;  Laterality: N/A;   LOWER EXTREMITY ANGIOGRAM Bilateral 04/18/2012   Procedure: LOWER EXTREMITY ANGIOGRAM;  Surgeon: Chuck Hint, MD;  Location: [facility] CATH LAB;  Service: Cardiovascular;  Laterality: Bilateral;  bilat lower extrem angio   LOWER EXTREMITY ANGIOGRAPHY Bilateral 05/02/2019   Procedure: LOWER EXTREMITY ANGIOGRAPHY;  Surgeon: Yates Decamp, MD;  Location: [facility] INVASIVE CV LAB;  Service: Cardiovascular;  Laterality: Bilateral;   LOWER EXTREMITY ANGIOGRAPHY Bilateral 02/28/2019   Procedure: LOWER EXTREMITY ANGIOGRAPHY;  Surgeon: Yates Decamp, MD;  Location: [facility] INVASIVE CV  LAB;  Service: Cardiovascular;  Laterality: Bilateral;   macular photocoagulation     (eye treatments for diabetic retinopathy)-Dr. Ashley Royalty   PERIPHERAL VASCULAR INTERVENTION  02/28/2019   Procedure: PERIPHERAL VASCULAR INTERVENTION;  Surgeon: Yates Decamp, MD;  Location: [facility] INVASIVE CV LAB;  Service: Cardiovascular;;   VENTRICULAR ASSIST DEVICE INSERTION N/A 12/31/2020   Procedure: VENTRICULAR ASSIST DEVICE INSERTION;  Surgeon: Yates Decamp, MD;  Location: [facility] INVASIVE CV LAB;  Service: Cardiovascular;  Laterality: N/A;     Scheduled Meds:  acetaminophen  650 mg Oral Q8H   acidophilus  1 capsule Oral Daily   vitamin C  1,000 mg Oral Daily   aspirin EC  81 mg Oral Daily   Chlorhexidine Gluconate Cloth  6 each Topical Daily   cholecalciferol  2,000 Units Oral Daily   clopidogrel  75 mg Oral Daily   docusate sodium  100 mg Oral Daily   enoxaparin (LOVENOX) injection  60 mg Subcutaneous Q24H   ezetimibe  10 mg Oral Daily   gemfibrozil  600 mg Oral BID AC   insulin aspart  0-20 Units Subcutaneous TID  WC   insulin aspart  0-5 Units Subcutaneous QHS   insulin aspart  3 Units Subcutaneous TID WC   insulin glargine-yfgn  40 Units Subcutaneous Daily   mouth rinse  15 mL Mouth Rinse BID   multivitamin with minerals  1 tablet Oral Daily   nutrition supplement (JUVEN)  1 packet Oral BID BM   pantoprazole  40 mg Oral Daily   rosuvastatin  20 mg Oral Daily   [START ON 03/17/2022] sacubitril-valsartan  1 tablet Oral BID   torsemide  20 mg Oral BID   triamcinolone  1 spray Each Nare Daily   zinc sulfate  220 mg Oral Daily   Continuous Infusions:  sodium chloride 10 mL/hr at 03/15/22 1154   cefTRIAXone (ROCEPHIN)  IV 2 g (03/15/22 1156)   magnesium sulfate bolus IVPB     PRN Meds:.sodium chloride, alum & mag hydroxide-simeth, bisacodyl, calcium carbonate, dextrose, docusate sodium, guaiFENesin-dextromethorphan, hydrALAZINE, HYDROmorphone (DILAUDID) injection, HYDROmorphone, labetalol, magnesium  sulfate bolus IVPB, metoprolol tartrate, ondansetron (ZOFRAN) IV, ondansetron, oxyCODONE, polyethylene glycol, potassium chloride, senna-docusate, traMADol  Allergies  Allergen Reactions   Shellfish-Derived Products Nausea And Vomiting and Other (See Comments)    Only Mussels cause severe nausea and vomiting    Latex Rash   Tape Rash and Other (See Comments)    Caused issues with the skin   Testosterone Rash    Other reaction(s): did not feel well Other reaction(s): did not feel well   Social History   Socioeconomic History   Marital status: Widowed    Spouse name: Not on file   Number of children: 0   Years of education: Not on file   Highest education level: Not on file  Occupational History   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 Retired. No pets. Enjoys fishing.   Updated 06/2021   Social Determinants of Health   Financial Resource Strain: Not on file  Food Insecurity: Not on file  Transportation Needs: Not on file  Physical Activity: Not on file  Stress: Not on file  Social Connections: Not on file  Intimate Partner Violence: Not on file   Family History  Problem Relation Age of Onset   Diabetes Mother    Hearing loss Mother    Hypertension Mother    Hyperlipidemia Mother    Heart disease Mother    Varicose Veins Mother    Varicose Veins Father    Dementia Father    Hyperlipidemia Brother    Diabetes Maternal  Grandmother    Vitals BP 104/68 (BP Location: Right Arm)   Pulse 89   Temp 98.3 F (36.8 C) (Oral)   Resp 18   Ht 5\' 9"  (1.753 m)   Wt 122 kg   SpO2 94%   BMI 39.72 kg/m    Physical Exam Constitutional:  lying in the bed and appears comfortable, not in acute distress     Comments:   Cardiovascular:     Rate and Rhythm: Normal rate and regular rhythm.     Heart sounds:   Pulmonary:     Effort: Pulmonary effort is normal.     Comments: Bilateral clear air entry  Abdominal:     Palpations: Abdomen is soft.     Tenderness: non distended and non tender  Musculoskeletal:        General: No swelling or tenderness. RT BKA +  Skin:    Comments:   Neurological:     General: Grossly non focal, awake, alert and oriented * 3  Psychiatric:        Mood and Affect: Mood normal.   Pertinent Microbiology Results for orders placed or performed during the hospital encounter of 03/08/22  Culture, blood (Routine x 2)     Status: Abnormal   Collection Time: 03/08/22  1:58 PM   Specimen: BLOOD  Result Value Ref Range Status   Specimen Description BLOOD BLOOD RIGHT HAND  Final   Special Requests   Final    BOTTLES DRAWN AEROBIC AND ANAEROBIC Blood Culture results may not be optimal due to an inadequate volume of blood received in culture bottles   Culture  Setup Time   Final    GRAM POSITIVE COCCI IN CHAINS IN BOTH AEROBIC AND ANAEROBIC BOTTLES CRITICAL RESULT CALLED TO, READ BACK BY AND VERIFIED WITH: PHARMD HAILEY VONDOHLEN ON 03/09/22 AT 1958 BY DRT Performed at [facility] Lab, [address] [place], [place] [address]    Culture STREPTOCOCCUS ANGINOSIS (A)  Final   Report Status 03/11/2022 FINAL  Final   Organism ID, Bacteria STREPTOCOCCUS ANGINOSIS  Final      Susceptibility   Streptococcus anginosis - MIC*    PENICILLIN <=0.06 SENSITIVE Sensitive     CEFTRIAXONE <=0.12 SENSITIVE Sensitive     ERYTHROMYCIN <=0.12 SENSITIVE Sensitive     LEVOFLOXACIN 1 SENSITIVE  Sensitive     VANCOMYCIN 0.5 SENSITIVE Sensitive     * STREPTOCOCCUS ANGINOSIS  Blood Culture ID Panel (Reflexed)     Status: Abnormal   Collection Time: 03/08/22  1:58 PM  Result Value Ref Range Status   Enterococcus faecalis NOT DETECTED NOT DETECTED Final   Enterococcus Faecium NOT DETECTED NOT DETECTED Final   Listeria monocytogenes NOT DETECTED NOT DETECTED Final   Staphylococcus species NOT DETECTED NOT DETECTED Final   Staphylococcus aureus (BCID) NOT DETECTED NOT DETECTED Final   Staphylococcus epidermidis NOT DETECTED NOT DETECTED Final   Staphylococcus lugdunensis NOT DETECTED NOT DETECTED Final   Streptococcus species DETECTED (A) NOT DETECTED Corrected    Comment: CRITICAL RESULT CALLED TO, READ BACK BY AND VERIFIED WITH: PHARMD HAILEY VONDOHLEN ON 03/09/22 AT 1958 BY DRT CORRECTED ON 05/02 AT 1054: PREVIOUSLY REPORTED AS CRITICAL RESULT CALLED TO, READ BACK BY AND VERIFIED WITH: PHARMD HAILEY VONDOHLEN ON 03/09/22 AT 1958 BY DRT    Streptococcus agalactiae NOT DETECTED NOT DETECTED Final   Streptococcus pneumoniae NOT DETECTED NOT DETECTED Final   Streptococcus pyogenes NOT DETECTED NOT DETECTED Final   A.calcoaceticus-baumannii NOT DETECTED NOT DETECTED Final   Bacteroides fragilis NOT DETECTED NOT DETECTED Final   Enterobacterales NOT DETECTED NOT DETECTED Final   Enterobacter cloacae complex NOT DETECTED NOT DETECTED Final   Escherichia coli NOT DETECTED NOT DETECTED Final   Klebsiella aerogenes NOT DETECTED NOT DETECTED Final   Klebsiella oxytoca NOT DETECTED NOT DETECTED Final  Klebsiella pneumoniae NOT DETECTED NOT DETECTED Final   Proteus species NOT DETECTED NOT DETECTED Final   Salmonella species NOT DETECTED NOT DETECTED Final   Serratia marcescens NOT DETECTED NOT DETECTED Final   Haemophilus influenzae NOT DETECTED NOT DETECTED Final   Neisseria meningitidis NOT DETECTED NOT DETECTED Final   Pseudomonas aeruginosa NOT DETECTED NOT DETECTED Final    Stenotrophomonas maltophilia NOT DETECTED NOT DETECTED Final   Candida albicans NOT DETECTED NOT DETECTED Final   Candida auris NOT DETECTED NOT DETECTED Final   Candida glabrata NOT DETECTED NOT DETECTED Final   Candida krusei NOT DETECTED NOT DETECTED Final   Candida parapsilosis NOT DETECTED NOT DETECTED Final   Candida tropicalis NOT DETECTED NOT DETECTED Final   Cryptococcus neoformans/gattii NOT DETECTED NOT DETECTED Final    Comment: Performed at [facility], [address], [place], [place] [address]  Culture, blood (Routine x 2)     Status: Abnormal   Collection Time: 03/08/22  2:03 PM   Specimen: BLOOD  Result Value Ref Range Status   Specimen Description BLOOD BLOOD LEFT HAND  Final   Special Requests   Final    BOTTLES DRAWN AEROBIC AND ANAEROBIC Blood Culture adequate volume   Culture  Setup Time   Final    GRAM POSITIVE COCCI IN CHAINS AEROBIC BOTTLE ONLY CRITICAL RESULT CALLED TO, READ BACK BY AND VERIFIED WITH: PHARM D A.LAWLESS ON [id number] AT 0836 BY E.PARRISH    Culture (A)  Final    STREPTOCOCCUS ANGINOSIS SUSCEPTIBILITIES PERFORMED ON PREVIOUS CULTURE WITHIN THE LAST 5 DAYS. Performed at [facility], [address] [place], [place] [address]    Report Status 03/11/2022 FINAL  Final  Resp Panel by RT-PCR (Flu A&B, Covid) Nasopharyngeal Swab     Status: None   Collection Time: 03/08/22  2:09 PM   Specimen: Nasopharyngeal Swab; Nasopharyngeal(NP) swabs in vial transport medium  Result Value Ref Range Status   SARS Coronavirus 2 by RT PCR NEGATIVE NEGATIVE Final    Comment: (NOTE) SARS-CoV-2 target nucleic acids are NOT DETECTED.  The SARS-CoV-2 RNA is generally detectable in upper respiratory specimens during the acute phase of infection. The lowest concentration of SARS-CoV-2 viral copies this assay can detect is 138 copies/mL. A negative result does not preclude SARS-Cov-2 infection and should not be used as the sole basis for treatment  or other patient management decisions. A negative result may occur with  improper specimen collection/handling, submission of specimen other than nasopharyngeal swab, presence of viral mutation(s) within the areas targeted by this assay, and inadequate number of viral copies(<138 copies/mL). A negative result must be combined with clinical observations, patient history, and epidemiological information. The expected result is Negative.  Fact Sheet for Patients:  BloggerCourse.com  Fact Sheet for Healthcare Providers:  SeriousBroker.it  This test is no t yet approved or cleared by the [place] FDA and  has been authorized for detection and/or diagnosis of SARS-CoV-2 by FDA under an Emergency Use Authorization (EUA). This EUA will remain  in effect (meaning this test can be used) for the duration of the COVID-19 declaration under Section 564(b)(1) of the Act, 21 U.S.C.section 360bbb-3(b)(1), unless the authorization is terminated  or revoked sooner.       Influenza A by PCR NEGATIVE NEGATIVE Final   Influenza B by PCR NEGATIVE NEGATIVE Final    Comment: (NOTE) The Xpert Xpress SARS-CoV-2/FLU/RSV plus assay is intended as an aid in the diagnosis of influenza from Nasopharyngeal swab  specimens and should not be used as a sole basis for treatment. Nasal washings and aspirates are unacceptable for Xpert Xpress SARS-CoV-2/FLU/RSV testing.  Fact Sheet for Patients: BloggerCourse.com  Fact Sheet for Healthcare Providers: SeriousBroker.it  This test is not yet approved or cleared by the [place] FDA and has been authorized for detection and/or diagnosis of SARS-CoV-2 by FDA under an Emergency Use Authorization (EUA). This EUA will remain in effect (meaning this test can be used) for the duration of the COVID-19 declaration under Section 564(b)(1) of the Act, 21 U.S.C. section  360bbb-3(b)(1), unless the authorization is terminated or revoked.  Performed at [facility], [address], [place], [place] [address]   MRSA Next Gen by PCR, Nasal     Status: None   Collection Time: 03/08/22 10:33 PM   Specimen: Nasal Mucosa; Nasal Swab  Result Value Ref Range Status   MRSA by PCR Next Gen NOT DETECTED NOT DETECTED Final    Comment: (NOTE) The GeneXpert MRSA Assay (FDA approved for NASAL specimens only), is one component of a comprehensive MRSA colonization surveillance program. It is not intended to diagnose MRSA infection nor to guide or monitor treatment for MRSA infections. Test performance is not FDA approved in patients less than 64 years old. Performed at [facility], [address], [place], [place] [address]   Surgical pcr screen     Status: None   Collection Time: 03/10/22  8:09 PM   Specimen: Nasal Mucosa; Nasal Swab  Result Value Ref Range Status   MRSA, PCR NEGATIVE NEGATIVE Final   Staphylococcus aureus NEGATIVE NEGATIVE Final    Comment: (NOTE) The Xpert SA Assay (FDA approved for NASAL specimens in patients 73 years of age and older), is one component of a comprehensive surveillance program. It is not intended to diagnose infection nor to guide or monitor treatment. Performed at [facility] Lab, [address], [place], [place] [address]   Culture, blood (Routine X 2) w Reflex to ID Panel     Status: None (Preliminary result)   Collection Time: 03/12/22  3:09 PM   Specimen: BLOOD  Result Value Ref Range Status   Specimen Description BLOOD BLOOD RIGHT ARM  Final   Special Requests   Final    BOTTLES DRAWN AEROBIC AND ANAEROBIC Blood Culture adequate volume   Culture   Final    NO GROWTH 4 DAYS Performed at [facility], [address], [place], [place] [address]    Report Status PENDING  Incomplete  Culture, blood (Routine X 2) w Reflex to ID Panel     Status: None (Preliminary result)   Collection Time:  03/12/22  3:17 PM   Specimen: BLOOD  Result Value Ref Range Status   Specimen Description BLOOD SITE NOT SPECIFIED  Final   Special Requests   Final    BOTTLES DRAWN AEROBIC AND ANAEROBIC Blood Culture adequate volume   Culture   Final    NO GROWTH 4 DAYS Performed at [facility], [address], [place], [place] [address]    Report Status PENDING  Incomplete   Pertinent Lab seen by me:    Latest Ref Rng & Units 03/16/2022    3:41 AM 03/11/2022    2:54 AM 03/10/2022    2:21 AM  CBC  WBC 4.0 - 10.5 K/uL 8.9   12.7   12.2    Hemoglobin 13.0 - 17.0 g/dL 13.0   86.5   78.4  Hematocrit 39.0 - 52.0 % 38.2   41.8   40.8    Platelets 150 - 400 K/uL 318   305   266        Latest Ref Rng & Units 03/16/2022    3:41 AM 03/15/2022    2:33 AM 03/14/2022    2:08 AM  CMP  Glucose 70 - 99 mg/dL 188   416   606    BUN 8 - 23 mg/dL 34   38   45    Creatinine 0.61 - 1.24 mg/dL 3.01   6.01   0.93    Sodium 135 - 145 mmol/L 139   140   137    Potassium 3.5 - 5.1 mmol/L 3.8   4.0   4.2    Chloride 98 - 111 mmol/L 105   106   105    CO2 22 - 32 mmol/L 25   25   23     Calcium 8.9 - 10.3 mg/dL 8.2   8.3   8.0       Pertinent Imagings/Other Imagings Plain films and CT images have been personally visualized and interpreted; radiology reports have been reviewed. Decision making incorporated into the Impression / Recommendations.  MR ANKLE RIGHT WO CONTRAST  Result Date: 03/11/2022 CLINICAL DATA:  Lateral foot ulceration.  Ankle pain. EXAM: MRI OF THE RIGHT ANKLE WITHOUT CONTRAST TECHNIQUE: Multiplanar, multisequence MR imaging of the ankle was performed. No intravenous contrast was administered. COMPARISON:  Radiograph 03/08/2022 FINDINGS: Despite efforts by the technologist and patient, motion artifact is present on today's exam and could not be eliminated. This reduces exam sensitivity and specificity. TENDONS Peroneal: Partially torn peroneus brevis with substantial distal peroneus brevis  tenosynovitis and indistinctness of the attachment of the peroneus brevis to the severely edematous remnant of the fifth metatarsal base. Moderate peroneus longus tendinopathy and mild peritendinitis. Posteromedial: Mild distal tibialis posterior tenosynovitis. Accessory navicular noted. Anterior: Unremarkable Achilles: Unremarkable Plantar Fascia: Accentuated signal in the medial band of the plantar fascia proximally potentially from plantar fasciitis, image 11 series 7. LIGAMENTS Lateral: Unremarkable Medial: Deltoid ligament intact. Thickened spring ligament, image 26 series 9. CARTILAGE Ankle Joint: Unremarkable Subtalar Joints/Sinus Tarsi: Small posterior subtalar joint effusion. Bones: Diffuse abnormal edema in the remaining base of the fifth metatarsal compatible with active osteomyelitis. Overlying wound/ulceration noted, image 31 series 9. Patchy marrow edema in the anterior calcaneus, cuboid,, and lateral and middle cuneiform noted along with the bases of the third and fourth metatarsals, more likely to be reactive than osteomyelitis although early osteomyelitis is difficult to totally exclude. Small effusion of the calcaneocuboid articulation. Other: Substantial subcutaneous edema anterolaterally and posteromedially in the ankle. Substantial subcutaneous edema along the dorsal and plantar foot. Low-grade edema tracks in the plantar foot musculature. IMPRESSION: 1. Active osteomyelitis in the remaining base of the fifth metatarsal, with overlying ulceration noted. There is partial tearing of the peroneus brevis tendon in the vicinity of the lateral malleolus and substantial distal peroneus brevis tenosynovitis and distal tendinopathy with questionable integrity of its attachment to the base of the fifth metatarsal. The tenosynovitis could be infected based on the osteomyelitis and large serration. 2. Moderate peroneus longus tendinopathy and mild peroneus longus tenosynovitis. 3. Mild distal tibialis  posterior tenosynovitis. 4. Patchy low-grade marrow edema in the cuboid, anterior calcaneus, middle and lateral cuneiforms, and bases of the third and fourth metatarsals, more likely reactive than representing osteomyelitis although early osteomyelitis in one or more of these locations is difficult to  totally exclude. 5. Small effusion of the calcaneocuboid articulation. 6. Subcutaneous edema along the ankle and foot, cellulitis is not excluded. There is also low-grade edema tracking along the plantar foot musculature which could be neurogenic or related to myositis. 7. Suspected mild plantar fasciitis. 8. Small posterior subtalar joint effusion. 9. Thickened spring ligament. Electronically Signed   By: Gaylyn Rong M.D.   On: 03/11/2022 08:08   DG CHEST PORT 1 VIEW  Result Date: 03/12/2022 CLINICAL DATA:  Shortness of breath. EXAM: PORTABLE CHEST 1 VIEW COMPARISON:  Chest x-ray 03/08/2022. FINDINGS: The heart is enlarged. There is central pulmonary vascular congestion. There is some interstitial opacities in the lung bases. There is no pleural effusion or pneumothorax identified. No acute fractures are seen. IMPRESSION: 1. Cardiomegaly with mild pulmonary edema pattern. Infection is not excluded in the lung bases. Electronically Signed   By: Darliss Cheney M.D.   On: 03/12/2022 22:25   DG Chest Portable 1 View  Result Date: 03/08/2022 CLINICAL DATA:  Shortness of breath. EXAM: PORTABLE CHEST 1 VIEW COMPARISON:  12/28/2020 FINDINGS: Heart size and mediastinal contours are unremarkable. No signs of pleural effusion or edema. Mild chronic bronchitic changes are identified bilaterally. No superimposed airspace consolidation. The visualized osseous structures are unremarkable. IMPRESSION: No acute cardiopulmonary abnormalities. Electronically Signed   By: Signa Kell M.D.   On: 03/08/2022 14:56   DG Foot Complete Right  Result Date: 03/08/2022 CLINICAL DATA:  Foot wound, open wound lateral RIGHT foot  EXAM: RIGHT FOOT COMPLETE - 3+ VIEW COMPARISON:  08/06/2021 FINDINGS: Osseous demineralization. Prior amputation of fifth ray with short fifth metatarsal base stump. Joint spaces preserved. Soft tissue ulcer identified at the lateral margin of the midfoot overlying the fifth metatarsal base with underlying bone destruction and minimal soft tissue gas consistent with osteomyelitis. Additional soft tissue swelling at the amputation bed from the fifth ray. No fracture or dislocation. IMPRESSION: Ulcer identified at lateral margin of mid RIGHT foot with associated bone destruction at the fifth metatarsal base consistent with osteomyelitis. Electronically Signed   By: Ulyses Southward M.D.   On: 03/08/2022 14:57   VAS [place] ABI WITH/WO TBI  Result Date: 03/09/2022  LOWER EXTREMITY DOPPLER STUDY Patient Name:  HERB EURESTI  Date of Exam:   03/09/2022 Medical Rec #: [id number]     Accession #:    [id number] Date of Birth: 12/21/1955    Patient Gender: M Patient Age:   10 years Exam Location:  [facility] Procedure:      VAS [place] ABI WITH/WO TBI Referring Phys: MARCUS DUDA --------------------------------------------------------------------------------  Indications: Ulceration. High Risk Factors: Hypertension, Diabetes. Other Factors: Sepsis, patient on pressors.  Comparison Study: Prior ABI done 06/10/16 Performing Technologist: Sherren Kerns RVS  Examination Guidelines: A complete evaluation includes at minimum, Doppler waveform signals and systolic blood pressure reading at the level of bilateral brachial, anterior tibial, and posterior tibial arteries, when vessel segments are accessible. Bilateral testing is considered an integral part of a complete examination. Photoelectric Plethysmograph (PPG) waveforms and toe systolic pressure readings are included as required and additional duplex testing as needed. Limited examinations for reoccurring indications may be performed as noted.  ABI Findings:  +---------+------------------+-----+-----------+--------+ Right    Rt Pressure (mmHg)IndexWaveform   Comment  +---------+------------------+-----+-----------+--------+ Brachial 82                     multiphasic         +---------+------------------+-----+-----------+--------+ PTA      57  0.70 monophasic          +---------+------------------+-----+-----------+--------+ DP       61                0.74 monophasic          +---------+------------------+-----+-----------+--------+ Great Toe0                 0.00                     +---------+------------------+-----+-----------+--------+ +---------+------------------+-----+-----------+-------+ Left     Lt Pressure (mmHg)IndexWaveform   Comment +---------+------------------+-----+-----------+-------+ Brachial 80                     multiphasic        +---------+------------------+-----+-----------+-------+ PTA      101               1.23 multiphasic        +---------+------------------+-----+-----------+-------+ DP       67                0.82 multiphasic        +---------+------------------+-----+-----------+-------+ Great Toe0                 0.00                    +---------+------------------+-----+-----------+-------+ +-------+-----------+-----------+------------+------------+ ABI/TBIToday's ABIToday's TBIPrevious ABIPrevious TBI +-------+-----------+-----------+------------+------------+ Right  0.74       0          0.72        no prior     +-------+-----------+-----------+------------+------------+ Left   1.23       0          1.01        No prior     +-------+-----------+-----------+------------+------------+  Bilateral ABIs appear essentially unchanged compared to prior study on 06/10/2016.  Summary: Right: Resting right ankle-brachial index indicates moderate right lower extremity arterial disease. The right toe-brachial index is abnormal. Left: Resting left  ankle-brachial index is within normal range. No evidence of significant left lower extremity arterial disease. The left toe-brachial index is abnormal. *See table(s) above for measurements and observations.  Electronically signed by Sherald Hess MD on 03/09/2022 at 4:59:09 PM.    Final      I spent  85 minutes for this patient encounter including review of prior medical records/discussing diagnostics and treatment plan with the patient/family/coordinate care with primary/other specialits with greater than 50% of time in face to face encounter.   Electronically signed by:   Odette Fraction, MD Infectious Disease Physician [facility] for Infectious Disease Pager: [phone]

## 2022-03-16 NOTE — Progress Notes (Addendum)
?  Progress Note ? ? ?Patient: Eduardo [name] VQM:[id number] DOB: June 22, 1956 DOA: 03/08/2022     8 ?DOS: the patient was seen and examined on 03/16/2022 at 9:55AM ?  ? ? ? ?Brief hospital course: ?66 yo M with DFI, sepsis and bacteremia. ? ?Now s/p R BKA and stable for discharge when CIR available. ? ? ? ? ? ? ? ? ?Assessment and Plan: ?*  Septic shock ?Streptococcal bacteremia ?-Continue Rocephin, day 9  ?- Discussed with ID, they recommend ruling out endocarditis with imaging ?- TTE ordered ?- If negative, will need Cardiology consultation for TEE ? ? ? ?CHF ?Appears euvolemic, feels at baseline ?-3.1 L net negative yesterday, creatinine and potassium stable ?- Continue torsemide ?- Hold Imdur for now ?- Resume Entresto tomorrow  ?- No longer taking Jardiance ? ? ?Hypertension ?Blood pressure controlled ?- Hold Carvedilol, Imdur ?- Resume Entresto ? ?Diabetes ?Glucose stable ?- Continue glargine ?-Continue mealtime aspart and sliding scale correction ?- Hold home NPH and terzepitide   ? ? ?Coronary disease ?-Continue Zetia, gemfibrozil, Crestor, aspirin, Plavix ?- Hold Coreg, Imdur ?- Resume Entresto tomorrow  ? ? ? ? ? ? ? ? ? ?Subjective: No orthopnea, swelling, fever, chest discomfort.  Pain is relatively well controlled.  He is constipated.  He is bored. ? ? ? ? ?Physical Exam: ?Vitals:  ? 03/15/22 1108 03/15/22 2021 03/16/22 0003 03/16/22 0506  ?BP: 114/72 (!) 101/59  116/71  ?Pulse: 97 94  90  ?Resp: '18 17  19  '$ ?Temp: 98.7 ?F (37.1 ?C) 99.8 ?F (37.7 ?C)  98.5 ?F (36.9 ?C)  ?TempSrc: Oral Oral  Oral  ?SpO2: 95% 96%  94%  ?Weight:   122 kg   ?Height:      ? ?Thin adult male, lying in bed, appears warm, no obvious distress, interactive and appropriate ?RRR, no murmurs, no peripheral edema ?Respiratory rate normal, lungs clear without rales or wheezes ?Attention normal, affect appropriate, judgment insight appear normal, speech fluent, face symmetric ? ? ? ?Data Reviewed: ?Discussed with ID.  Nursing notes reviewed,  vital signs reviewed ?Metabolic panel and glucose reviewed, complete blood count reviewed, no change ?Blood cultures reviewed  ? ? ?Disposition: ?Status is: Inpatient ?Medically ready for discharge to CIR when bed available ? ? ? ? ? ? ? ?Author: ?Edwin Dada, MD ?03/16/2022 11:17 AM ? ?For on call review [email or web address]  ? ? ?

## 2022-03-17 DIAGNOSIS — A419 Sepsis, unspecified organism: Secondary | ICD-10-CM | POA: Diagnosis not present

## 2022-03-17 DIAGNOSIS — I5043 Acute on chronic combined systolic (congestive) and diastolic (congestive) heart failure: Secondary | ICD-10-CM | POA: Diagnosis not present

## 2022-03-17 DIAGNOSIS — E1165 Type 2 diabetes mellitus with hyperglycemia: Secondary | ICD-10-CM | POA: Diagnosis not present

## 2022-03-17 DIAGNOSIS — R6521 Severe sepsis with septic shock: Secondary | ICD-10-CM | POA: Diagnosis not present

## 2022-03-17 DIAGNOSIS — I10 Essential (primary) hypertension: Secondary | ICD-10-CM | POA: Diagnosis not present

## 2022-03-17 DIAGNOSIS — I2510 Atherosclerotic heart disease of native coronary artery without angina pectoris: Secondary | ICD-10-CM | POA: Diagnosis not present

## 2022-03-17 LAB — CULTURE, BLOOD (ROUTINE X 2)
Culture: NO GROWTH
Culture: NO GROWTH
Special Requests: ADEQUATE
Special Requests: ADEQUATE

## 2022-03-17 LAB — ECHOCARDIOGRAM COMPLETE
AR max vel: 3.27 cm2
AV Area VTI: 3.05 cm2
AV Area mean vel: 2.97 cm2
AV Mean grad: 2 mmHg
AV Peak grad: 3.5 mmHg
Ao pk vel: 0.93 m/s
Area-P 1/2: 4.89 cm2
Height: 69 in
S' Lateral: 5.8 cm
Weight: 4303.38 oz

## 2022-03-17 LAB — GLUCOSE, CAPILLARY
Glucose-Capillary: 152 mg/dL — ABNORMAL HIGH (ref 70–99)
Glucose-Capillary: 222 mg/dL — ABNORMAL HIGH (ref 70–99)
Glucose-Capillary: 241 mg/dL — ABNORMAL HIGH (ref 70–99)
Glucose-Capillary: 252 mg/dL — ABNORMAL HIGH (ref 70–99)

## 2022-03-17 NOTE — Progress Notes (Signed)
?Progress Note ? ? ?Patient: Eduardo [name] WER:[id number] DOB: 25-Apr-1956 DOA: 03/08/2022     9 ?DOS: the patient was seen and examined on 03/17/2022 at 8:37AM ?  ? ? ? ?Brief hospital course: ?Eduardo [name] is a 66 y.o. M with CAD s/p CABG 2017, PVD s/p R SFA stent 2020, DM, chronic heart failure EF 15-20%, chronic kidney disease and prior right partial foot amputation who presented with few days fever, chills, malaise and painful swollen right foot wound. ? ?In the ER he was started on peripheral Levophed and admitted to the ICU on vancomycin and Cefepime. ? ? ?4/30: Admitted to ICU ?5/1: Ortho consulted, noted exposed bone at base of 5th metatarsal and cellulitis up peroneal tendons ?5/2: MRI shows osteo, tenosynovitis; able to stop Levophed that night ?5/3: To OR for RIGHT BKA by Eduardo [name] with Eduardo [name], pressors resumed post-op ?5/4: Off pressors again ?5/5: +12.9L on admission, diuresis started ?5/6: Switched back to home oral diuretic ?5/8: ID consulted, TTE ordered, narrowed to penicillin ? ? ? ? ?Assessment and Plan: ?* Septic shock ([place]) ?Patient presented with tachycardia, tachypnea, and sustained blood pressure less than 90 systolic in the setting of streptococcal bacteremia and diabetic foot infection. ? ?Treated with Levophed for several days in the ICU, hemodynamics improved back to baseline. ? ?Osteomyelitis of right foot ([place]) ?S/p right BKA by Eduardo [name] on 5/4 ? ?Streptococcal bacteremia ?Blood cultures on admission grew Streptococcus anginosus, source possibly diabetic foot infection. ID consulted, who have narrowed to penicillin and recommend ruling out endocarditis. ?- Continue Penicillin, day 10 of antibitoics ?- Follow repeat cultures from 5/4 ?- Follow TTE results and if negative will obtain TEE from Eduardo [name] ? ?Acute on chronic combined systolic and diastolic CHF (congestive heart failure) ([place]) ?Patient's baseline EF is less than 20% due to ischemic heart disease. ? ?Net negative 1.4L yesterday back  on home torsemide, symptoms seem better.  Still needs titration of GDMT.  ?- Continue home torsemide ?- Continue Entresto ?- Hold Imdur ?- Hold jardiance for now ?- Will need titration of BB if BP tolerates ?- Strict ins and outs, daily BMP ? ? ? ?Hypophosphatemia ?Supplemented and resolved ? ?AKI (acute kidney injury) ([place]) ?Creatinine 2.1 on admission, improved down now to 1.7/baseline. ? ?Essential hypertension, benign ?Blood pressure soft still.  Baseline BP 19-509 systolic [name] low EF. ?- Hold Imdur for now ?- Hold home carvedilol for now, may resume this or metoprolol later ?- Hold Jardiance ? ?Controlled type 2 diabetes mellitus with hyperglycemia, with long-term current use of insulin ([place]) ?Glucose overall controlled. ?- Continue glargine ?- Continue sliding scale corrections ?- Hold home NPH and terzepitide   ? ?Stage 3b chronic kidney disease (CKD) ([place]) ?Cr back to baseline 1.5-1.8 ? ?Coronary artery disease involving native coronary artery of native heart without angina pectoris ?- Continue Zetia, gemfibrozil, Crestor ?- Continue aspirin, Plavix ?- Hold Coreg, Imdur, Entresto, Jardiance ? ?PAD (peripheral artery disease) ([place]) ?- Continue Crestor, Zetia, gemfibrozil ?- Contineu aspirin, plavix ? ?Morbid obesity with BMI of 40.0-44.9, adult ([place]) ?BMI 42 ? ? ? ? ? ? ? ? ? ?Subjective: Patient is overall frustrated and somewhat depressed.  He is not having any orthopnea, dyspnea, swelling, fever, confusion.  Pain is controlled. ? ? ? ? ?Physical Exam: ?Vitals:  ? 03/16/22 2009 03/17/22 3267 03/17/22 0341 03/17/22 0857  ?BP: 113/71  105/65 107/86  ?Pulse: 94  93   ?Resp: 17  18   ?Temp: 99.5 ?F (37.5 ?C)  Marland Kitchen)  97.4 ?F (36.3 ?C)   ?TempSrc: Oral  Oral   ?SpO2: 95%  95% 95%  ?Weight:  118.7 kg    ?Height:      ? ?Adult male, lying in bed, no acute distress, tearful ?RRR, no murmurs, no peripheral edema, heart rate slightly elevated ?Respiratory rate normal, lungs clear without rales or wheezes ?Attention  normal, affect appropriate, judgment insight appear normal, face symmetric, speech fluent ?Status post right BKA, no swelling, drainage, or odor from his wound ? ?Data Reviewed: ?Discussed with cardiology, nursing notes reviewed, vital signs reviewed. ?Echocardiogram pending ? ? ?  ? ? ? ?Disposition: ?Status is: Inpatient ?The patient was admitted for septic shock due to diabetic foot infection and strep bacteremia. ? ?He had subsequent complication of congestive heart failure, but this appears to be stabilized.  He is medically complex, as he has strep bacteremia and advanced heart failure, with low blood pressure which will still require daily titration of his goal-directed CHF management. ? ?In addition it is my suspicion that he needs not only complex physical therapy for his neuro right BKA, but also intensive occupational therapy to adapt to use of new assistive devices, to regain independence and function with his ADLs.  We will reconsult PT and OT ? ? ? ? ? ? ? ?Author: ?Eduardo Dada, MD ?03/17/2022 10:22 AM ? ?For on call review [email or web address]  ? ? ?

## 2022-03-17 NOTE — Progress Notes (Signed)
Inpatient Rehab Admissions Coordinator:  ? ?Notified by insurance of request for peer to peer, which Dr. Loleta Books completed.  Insurance does not feel patient meets therapy criteria for inpatient rehab, despite showing excellent progress with both PT and OT.  I let pt know and he would like to appeal this decision.  I have started this process today and will continue to follow.  Should take no more than 72 hours, but I'm hopeful we will have a decision sooner.   ? ?Shann Medal, PT, DPT ?Admissions Coordinator ?[phone] ?03/17/22  ?12:12 PM ? ?

## 2022-03-17 NOTE — Progress Notes (Signed)
ID brief note ? ?Remains afebrile ?TTE with no obvious vegetations but new AV thickness ?Planned for TEE tomorrow by Dr Einar Gip ?Repeat blood cx 5/4 no growth in 5 days  ? ?Continue IV Pen G as is ?Final abtx recs to be made pending TEE ? ?Rosiland Oz, MD ?Infectious Disease Physician ?[facility] ?[address] ?[place], [place] [address] ?Phone: [phone]  Fax: [phone] ? ?

## 2022-03-17 NOTE — Progress Notes (Signed)
Physical Therapy Treatment ?Patient Details ?Name: Eduardo [name] ?MRN: [id number] ?DOB: 1956/08/24 ?Today's Date: 03/17/2022 ? ? ?History of Present Illness 66 y/o M presenting to [facility] with sepsis with exposed necrotic bone and ulcer over the base of the 5th met. Also with cellulitis extending up the peroneal tendons. 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 History of CAD s/p triple-vessel CABG in 2017, HTN, HLD, Y6RS, systolic HF, osteomyelitis and partial R foot amputation. ? ?  ?PT Comments  ? ? Pt presents sitting in recliner and eager for therapy.  PT donned shoe to R foot and increased education given for improved transfers from recliner, bed and arm chair w/ mod A and improving to min A.  Pt states feeling more confident wearing shoe and improved foot clearance and step length, amb in room up to 12' per trial.  O2 on RA remained >95%.  Pt required seated rest breaks 2/2 fatigue, especially LLE, but very happy w/ results.  PT spent time educating on transfers, use of RW, LE exercises and positioning.  Continue to progress therapy for safe return to home. ?  ?Recommendations for follow up therapy are one component of a multi-disciplinary discharge planning process, led by the attending physician.  Recommendations may be updated based on patient status, additional functional criteria and insurance authorization. ? ?Follow Up Recommendations ? Acute inpatient rehab (3hours/day) ?  ?  ?Assistance Recommended at Discharge Intermittent Supervision/Assistance  ?Patient can return home with the following A lot of help with walking and/or transfers;A little help with bathing/dressing/bathroom;Assistance with cooking/housework;Assist for transportation;Help with stairs or ramp for entrance ?  ?Equipment Recommendations ? Wheelchair cushion (measurements PT);Wheelchair (measurements PT);BSC/3in1  ?  ?Recommendations for Other Services Rehab consult ? ? ?  ?Precautions / Restrictions Precautions ?Precautions: Fall;Other  (comment) ?Precaution Comments: R BKA ?Required Braces or Orthoses: Other Brace ?Other Brace: limb protector ?Restrictions ?Weight Bearing Restrictions: Yes ?RLE Weight Bearing: Non weight bearing  ?  ? ?Mobility ? Bed Mobility ?  ?  ?  ?  ?  ?  ?  ?  ?  ? ?Transfers ?Overall transfer level: Needs assistance ?Equipment used: Rolling walker (2 wheels) ?Transfers: Sit to/from Stand ?Sit to Stand: Mod assist, Min assist ?  ?Step pivot transfers: Min assist ?  ?  ?  ?General transfer comment: Pt educated on BUE placement for stand to sit transfers, using split hand placement for sit to stand but improved performance, mod to min A w/ cueing. ?  ? ?Ambulation/Gait ?Ambulation/Gait assistance: Min assist ?Gait Distance (Feet): 12 Feet ?Assistive device: Rolling walker (2 wheels) ?Gait Pattern/deviations: Step-to pattern ?  ?  ?  ?General Gait Details: improved foot clearance and independence to bed and then to chair on opposite side, states wearing R shoe increases confidence. ? ? ?Stairs ?  ?  ?  ?  ?  ? ? ?Wheelchair Mobility ?  ? ?Modified Rankin (Stroke Patients Only) ?  ? ? ?  ?Balance   ?Sitting-balance support: No upper extremity supported ?Sitting balance-Leahy Scale: Good ?  ?  ?Standing balance support: Bilateral upper extremity supported, Single extremity supported ?Standing balance-Leahy Scale: Fair ?Standing balance comment: RW ?  ?  ?  ?  ?  ?  ?  ?  ?  ?  ?  ?  ? ?  ?Cognition Arousal/Alertness: Awake/alert ?Behavior During Therapy: [facility] for tasks assessed/performed ?Overall Cognitive Status: Within Functional Limits for tasks assessed ?  ?  ?  ?  ?  ?  ?  ?  ?  ?  ?  ?  ?  ?  ?  ?  ?  General Comments: Pt eager to learn. ?  ?  ? ?  ?Exercises   ? ?  ?General Comments   ?  ?  ? ?Pertinent Vitals/Pain Pain Assessment ?Pain Assessment: No/denies pain ?Pain Score: 0-No pain  ? ? ?Home Living   ?  ?  ?  ?  ?  ?  ?  ?  ?  ?   ?  ?Prior Function    ?  ?  ?   ? ?PT Goals (current goals can now be found in the care  plan section) Acute Rehab PT Goals ?Patient Stated Goal: To return home ?PT Goal Formulation: With patient ?Time For Goal Achievement: 03/26/22 ?Potential to Achieve Goals: Good ? ?  ?Frequency ? ? ?   ? ? ? ?  ?PT Plan    ? ? ?Co-evaluation   ?  ?PT goals addressed during session: Mobility/safety with mobility ?  ?  ? ?  ?AM-PAC PT "6 Clicks" Mobility   ?Outcome Measure ? Help needed turning from your back to your side while in a flat bed without using bedrails?: A Little ?Help needed moving from lying on your back to sitting on the side of a flat bed without using bedrails?: A Little ?  ?Help needed standing up from a chair using your arms (e.g., wheelchair or bedside chair)?: A Lot ?Help needed to walk in hospital room?: A Little ?Help needed climbing 3-5 steps with a railing? : Total ?6 Click Score: 12 ? ?  ?End of Session Equipment Utilized During Treatment: Gait belt ?Activity Tolerance: Patient tolerated treatment well ?Patient left: in chair;with call bell/phone within reach ?Nurse Communication: Mobility status;Need for lift equipment ?PT Visit Diagnosis: Unsteadiness on feet (R26.81);Other abnormalities of gait and mobility (R26.89) ?  ? ? ?Time: 4045-9136 ?PT Time Calculation (min) (ACUTE ONLY): 42 min ? ?Charges:  $Gait Training: 8-22 mins ?$Therapeutic Activity: 23-37 mins          ?          ? ?Ladoris Gene, PT  ? ? ?Ladoris Gene ?03/17/2022, 10:46 AM ? ?

## 2022-03-17 NOTE — Anesthesia Preprocedure Evaluation (Addendum)
Anesthesia Evaluation  ?Patient identified by MRN, date of birth, ID band ?Patient awake ? ? ? ?Reviewed: ?Allergy & Precautions, NPO status , Patient's Chart, lab work & pertinent test results ? ?History of Anesthesia Complications ?Negative for: history of anesthetic complications ? ?Airway ?Mallampati: II ? ?TM Distance: >3 FB ?Neck ROM: Full ? ? ? Dental ?  ?Pulmonary ?neg pulmonary ROS, former smoker,  ?  ?Pulmonary exam normal ? ? ? ? ? ? ? Cardiovascular ?hypertension, + CAD, + Past MI, + CABG, + Peripheral Vascular Disease and +CHF  ?Normal cardiovascular exam ? ? ?Echo 03/16/22: EF 30-35%, global hypokinesis, akinesis of entire inferolateral wall, normal RVSF, mild-mod MR, AV appears myxomatous w/o obvious vegetation, no stenosis or regurgitation ?  ?Neuro/Psych ?negative neurological ROS ?   ? GI/Hepatic ?negative GI ROS, Neg liver ROS,   ?Endo/Other  ?diabetes ? Renal/GU ?CRFRenal disease (CKD, Cr 1.61)  ?negative genitourinary ?  ?Musculoskeletal ?negative musculoskeletal ROS ?(+)  ? Abdominal ?  ?Peds ? Hematology ?negative hematology ROS ?(+)   ?Anesthesia Other Findings ? ?Initially admitted to ICU 4/30 with bacteremia/septic shock requiring Levophed. Now stable w/o vasopressors. ? Reproductive/Obstetrics ? ?  ? ? ? ? ? ? ? ? ? ? ? ? ? ?  ?  ? ? ? ? ? ?Anesthesia Physical ?Anesthesia Plan ? ?ASA: 4 ? ?Anesthesia Plan: MAC  ? ?Post-op Pain Management: Minimal or no pain anticipated  ? ?Induction: Intravenous ? ?PONV Risk Score and Plan: 1 and Propofol infusion, TIVA and Treatment may vary due to age or medical condition ? ?Airway Management Planned: Natural Airway, Nasal Cannula and Simple Face Mask ? ?Additional Equipment: None ? ?Intra-op Plan:  ? ?Post-operative Plan:  ? ?Informed Consent: I have reviewed the patients History and Physical, chart, labs and discussed the procedure including the risks, benefits and alternatives for the proposed anesthesia with the  patient or authorized representative who has indicated his/her understanding and acceptance.  ? ? ? ? ? ?Plan Discussed with:  ? ?Anesthesia Plan Comments:   ? ? ? ? ? ?Anesthesia Quick Evaluation ? ?

## 2022-03-18 ENCOUNTER — Encounter (HOSPITAL_COMMUNITY)
Admission: EM | Disposition: A | Discharge status: Rehabilitation Facility | Payer: Self-pay | Source: Home / Self Care | Attending: Family Medicine

## 2022-03-18 ENCOUNTER — Inpatient Hospital Stay (HOSPITAL_COMMUNITY): Payer: Medicare Other | Admitting: Certified Registered Nurse Anesthetist

## 2022-03-18 ENCOUNTER — Encounter (HOSPITAL_COMMUNITY): Payer: Self-pay | Admitting: Pulmonary Disease

## 2022-03-18 ENCOUNTER — Inpatient Hospital Stay (HOSPITAL_COMMUNITY): Payer: Medicare Other

## 2022-03-18 DIAGNOSIS — I255 Ischemic cardiomyopathy: Secondary | ICD-10-CM

## 2022-03-18 DIAGNOSIS — A419 Sepsis, unspecified organism: Secondary | ICD-10-CM | POA: Diagnosis not present

## 2022-03-18 DIAGNOSIS — R7881 Bacteremia: Secondary | ICD-10-CM

## 2022-03-18 DIAGNOSIS — I5022 Chronic systolic (congestive) heart failure: Secondary | ICD-10-CM

## 2022-03-18 DIAGNOSIS — R6521 Severe sepsis with septic shock: Secondary | ICD-10-CM | POA: Diagnosis not present

## 2022-03-18 DIAGNOSIS — I252 Old myocardial infarction: Secondary | ICD-10-CM

## 2022-03-18 DIAGNOSIS — N179 Acute kidney failure, unspecified: Secondary | ICD-10-CM

## 2022-03-18 DIAGNOSIS — E1165 Type 2 diabetes mellitus with hyperglycemia: Secondary | ICD-10-CM | POA: Diagnosis not present

## 2022-03-18 DIAGNOSIS — I2510 Atherosclerotic heart disease of native coronary artery without angina pectoris: Secondary | ICD-10-CM

## 2022-03-18 HISTORY — PX: TEE WITHOUT CARDIOVERSION: SHX5443

## 2022-03-18 LAB — BASIC METABOLIC PANEL
Anion gap: 8 (ref 5–15)
BUN: 33 mg/dL — ABNORMAL HIGH (ref 8–23)
CO2: 28 mmol/L (ref 22–32)
Calcium: 8.3 mg/dL — ABNORMAL LOW (ref 8.9–10.3)
Chloride: 103 mmol/L (ref 98–111)
Creatinine, Ser: 1.57 mg/dL — ABNORMAL HIGH (ref 0.61–1.24)
GFR, Estimated: 48 mL/min — ABNORMAL LOW (ref >60)
Glucose, Bld: 212 mg/dL — ABNORMAL HIGH (ref 70–99)
Potassium: 3.9 mmol/L (ref 3.5–5.1)
Sodium: 139 mmol/L (ref 135–145)

## 2022-03-18 LAB — CBC
HCT: 38.5 % — ABNORMAL LOW (ref 39.0–52.0)
Hemoglobin: 12.9 g/dL — ABNORMAL LOW (ref 13.0–17.0)
MCH: 30.1 pg (ref 26.0–34.0)
MCHC: 33.5 g/dL (ref 30.0–36.0)
MCV: 90 fL (ref 80.0–100.0)
Platelets: 287 10*3/uL (ref 150–400)
RBC: 4.28 MIL/uL (ref 4.22–5.81)
RDW: 14.1 % (ref 11.5–15.5)
WBC: 7 10*3/uL (ref 4.0–10.5)
nRBC: 0 % (ref 0.0–0.2)

## 2022-03-18 LAB — GLUCOSE, CAPILLARY
Glucose-Capillary: 216 mg/dL — ABNORMAL HIGH (ref 70–99)
Glucose-Capillary: 166 mg/dL — ABNORMAL HIGH (ref 70–99)
Glucose-Capillary: 170 mg/dL — ABNORMAL HIGH (ref 70–99)
Glucose-Capillary: 148 mg/dL — ABNORMAL HIGH (ref 70–99)
Glucose-Capillary: 177 mg/dL — ABNORMAL HIGH (ref 70–99)

## 2022-03-18 SURGERY — ECHOCARDIOGRAM, TRANSESOPHAGEAL
Anesthesia: Monitor Anesthesia Care

## 2022-03-18 MED ORDER — PHENYLEPHRINE HCL-NACL 20-0.9 MG/250ML-% IV SOLN
INTRAVENOUS | Status: DC | PRN
  Administered 2022-03-18: 30 ug/min via INTRAVENOUS

## 2022-03-18 MED ORDER — AMOXICILLIN 500 MG PO CAPS
1000.0000 mg | ORAL_CAPSULE | Freq: Three times a day (TID) | ORAL | Status: DC
  Administered 2022-03-18 – 2022-03-21 (×9): 1000 mg via ORAL
  Filled 2022-03-18 (×9): qty 2

## 2022-03-18 MED ORDER — LIDOCAINE 2% (20 MG/ML) 5 ML SYRINGE
INTRAMUSCULAR | Status: DC | PRN
Start: 2022-03-18 — End: 2022-03-18
  Administered 2022-03-18: 100 mg via INTRAVENOUS

## 2022-03-18 MED ORDER — BISACODYL 10 MG RE SUPP: 10.0000 mg | Freq: Once | RECTAL | Status: DC

## 2022-03-18 MED ORDER — SODIUM CHLORIDE 0.9 % IV SOLN: INTRAVENOUS | Status: DC

## 2022-03-18 MED ORDER — SENNOSIDES-DOCUSATE SODIUM 8.6-50 MG PO TABS
1.0000 | ORAL_TABLET | Freq: Two times a day (BID) | ORAL | Status: DC
  Administered 2022-03-18 – 2022-03-20 (×4): 1 via ORAL
  Filled 2022-03-18 (×6): qty 1

## 2022-03-18 MED ORDER — PROPOFOL 10 MG/ML IV BOLUS
INTRAVENOUS | Status: DC | PRN
Start: 2022-03-18 — End: 2022-03-18
  Administered 2022-03-18: 20 mg via INTRAVENOUS

## 2022-03-18 MED ORDER — PHENYLEPHRINE 80 MCG/ML (10ML) SYRINGE FOR IV PUSH (FOR BLOOD PRESSURE SUPPORT)
PREFILLED_SYRINGE | INTRAVENOUS | Status: DC | PRN
  Administered 2022-03-18 (×3): 240 ug via INTRAVENOUS
  Administered 2022-03-18: 160 ug via INTRAVENOUS

## 2022-03-18 MED ORDER — PROPOFOL 500 MG/50ML IV EMUL
INTRAVENOUS | Status: DC | PRN
  Administered 2022-03-18: 100 ug/kg/min via INTRAVENOUS

## 2022-03-18 MED ORDER — POLYETHYLENE GLYCOL 3350 17 G PO PACK
17.0000 g | PACK | Freq: Two times a day (BID) | ORAL | Status: DC
  Administered 2022-03-18 – 2022-03-21 (×5): 17 g via ORAL
  Filled 2022-03-18 (×6): qty 1

## 2022-03-18 NOTE — H&P (View-Only) (Signed)
Brief history: Patient with diabetes mellitus, hypertension, hyperlipidemia, obesity, peripheral arterial disease admitted with sepsis, underwent right below-knee amputation. ? ?Due to positive blood cultures, he was referred for evaluation of endocarditis.  Echocardiogram revealed new onset thickening of the aortic valve, with a suspicion for endocarditis, he is now set up for TEE. ? ?Subjective:  ?No chest pain or shortness of breath.  No discomfort in his lower extremity. ? ?Intake/Output from previous day: ? ?I/O last 3 completed shifts: ?In: 1400 [P.O.:1400] ?Out: 3925 [[id number]] ?Total I/O ?In: 0  ?Out: 675 [Urine:675] ?Net IO Since Admission: -391.52 mL [03/18/22 0823]  ?Blood pressure (!) 90/52, pulse 80, temperature (!) 97.5 ?F (36.4 ?C), temperature source Temporal, resp. rate 20, height 5' 9"  (1.753 m), weight 118 kg, SpO2 97 %. ?Physical Exam ?Constitutional:   ?   Appearance: He is obese.  ?Neck:  ?   Vascular: No JVD.  ?Cardiovascular:  ?   Rate and Rhythm: Normal rate and regular rhythm.  ?   Heart sounds: Normal heart sounds. No murmur heard. ?  No gallop.  ?Pulmonary:  ?   Effort: Pulmonary effort is normal.  ?   Breath sounds: Normal breath sounds.  ?Abdominal:  ?   General: Bowel sounds are normal.  ?   Palpations: Abdomen is soft.  ?Musculoskeletal:     ?   General: Deformity (right BKA) present.  ?   Left lower leg: No edema.  ? ? ?Lab Results: ?BMP ?BNP (last 3 results) ?No results for input(s): BNP in the last 8760 hours. ? ?ProBNP (last 3 results) ?No results for input(s): PROBNP in the last 8760 hours. ? ?  Latest Ref Rng & Units 03/18/2022  ?  4:16 AM 03/16/2022  ?  3:41 AM 03/15/2022  ?  2:33 AM  ?BMP  ?Glucose 70 - 99 mg/dL 212   120   158    ?BUN 8 - 23 mg/dL 33   34   38    ?Creatinine 0.61 - 1.24 mg/dL 1.57   1.61   1.66    ?Sodium 135 - 145 mmol/L 139   139   140    ?Potassium 3.5 - 5.1 mmol/L 3.9   3.8   4.0    ?Chloride 98 - 111 mmol/L 103   105   106    ?CO2 22 - 32 mmol/L 28   25    25     ?Calcium 8.9 - 10.3 mg/dL 8.3   8.2   8.3    ? ? ?  Latest Ref Rng & Units 03/11/2022  ?  2:54 AM 03/08/2022  ?  2:40 PM 12/28/2020  ? 11:08 AM  ?Hepatic Function  ?Total Protein 6.5 - 8.1 g/dL 5.9   6.3   7.0    ?Albumin 3.5 - 5.0 g/dL 2.1   2.4   3.9    ?AST 15 - 41 U/L 26   28   50    ?ALT 0 - 44 U/L 30   24   36    ?Alk Phosphatase 38 - 126 U/L 63   71   63    ?Total Bilirubin 0.3 - 1.2 mg/dL 0.6   1.0   0.8    ? ? ?  Latest Ref Rng & Units 03/18/2022  ?  4:16 AM 03/16/2022  ?  3:41 AM 03/11/2022  ?  2:54 AM  ?CBC  ?WBC 4.0 - 10.5 K/uL 7.0   8.9   12.7    ?  Hemoglobin 13.0 - 17.0 g/dL 12.9   12.9   13.9    ?Hematocrit 39.0 - 52.0 % 38.5   38.2   41.8    ?Platelets 150 - 400 K/uL 287   318   305    ? ?Lipid Panel  ?   ?Component Value Date/Time  ? CHOL 96 (L) 08/18/2021 1538  ? TRIG 138 08/18/2021 1538  ? HDL 30 (L) 08/18/2021 1538  ? CHOLHDL 3.9 06/26/2021 0949  ? CHOLHDL 4.4 09/28/2011 1358  ? VLDL 21 09/28/2011 1358  ? [place] 42 08/18/2021 1538  ? ?Cardiac Panel (last 3 results) ?No results for input(s): CKTOTAL, CKMB, TROPONINI, RELINDX in the last 72 hours. ? ?HEMOGLOBIN A1C ?Lab Results  ?Component Value Date  ? HGBA1C 7.3 (H) 03/11/2022  ? MPG 162.81 03/11/2022  ? ?TSH ?No results for input(s): TSH in the last 8760 hours. ?Imaging: ?Imaging results have been reviewed ? ?Cardiac Studies: ? ?Echocardiogram 03/17/2022:  ?1. Left ventricular ejection fraction, by estimation, is 30 to 35%. Left ventricular ejection fraction by PLAX is 37 %. The left ventricle has moderate to severely decreased function. The left ventricle demonstrates global hypokinesis. The left  ?ventricular internal cavity size was mildly dilated. Left ventricular diastolic parameters are indeterminate. Elevated left ventricular end-diastolic pressure. There is akinesis of the left ventricular, entire inferolateral wall. ? 2. Right ventricular systolic function is normal. The right ventricular size is normal. ? 3. Left atrial size was  moderately dilated. ? 4. The mitral valve is normal in structure. Mild to moderate mitral valve regurgitation. No evidence of mitral stenosis. ? 5. The aortic valve appears myxomatous. There is no obvious vegetation.. The aortic valve is tricuspid. Aortic valve regurgitation is not visualized. Aortic valve sclerosis is present, with no evidence of aortic valve stenosis. ? ?Comparison(s): No significant change from prior study. 12/29/2020. AV thickness is new. Poor echo window. Recommend TEE if clinically indicated. ? ? ?Scheduled Meds: ? [MAR Hold] acetaminophen  650 mg Oral Q8H  ? [MAR Hold] acidophilus  1 capsule Oral Daily  ? [MAR Hold] vitamin C  1,000 mg Oral Daily  ? [MAR Hold] aspirin EC  81 mg Oral Daily  ? [MAR Hold] cholecalciferol  2,000 Units Oral Daily  ? [MAR Hold] clopidogrel  75 mg Oral Daily  ? [MAR Hold] docusate sodium  100 mg Oral Daily  ? [MAR Hold] enoxaparin (LOVENOX) injection  60 mg Subcutaneous Q24H  ? [MAR Hold] ezetimibe  10 mg Oral Daily  ? [MAR Hold] gemfibrozil  600 mg Oral BID AC  ? [MAR Hold] insulin aspart  0-20 Units Subcutaneous TID WC  ? [MAR Hold] insulin aspart  0-5 Units Subcutaneous QHS  ? [MAR Hold] insulin aspart  3 Units Subcutaneous TID WC  ? [MAR Hold] insulin glargine-yfgn  40 Units Subcutaneous Daily  ? [MAR Hold] mouth rinse  15 mL Mouth Rinse BID  ? [MAR Hold] multivitamin with minerals  1 tablet Oral Daily  ? [MAR Hold] nutrition supplement (JUVEN)  1 packet Oral BID BM  ? [MAR Hold] pantoprazole  40 mg Oral Daily  ? [MAR Hold] rosuvastatin  20 mg Oral Daily  ? [MAR Hold] sacubitril-valsartan  1 tablet Oral BID  ? [MAR Hold] torsemide  20 mg Oral BID  ? [MAR Hold] triamcinolone  1 spray Each Nare Daily  ? [MAR Hold] zinc sulfate  220 mg Oral Daily  ? ?Continuous Infusions: ? [MAR Hold] sodium chloride 10 mL/hr at 03/15/22 1154  ?  sodium chloride    ? [MAR Hold] magnesium sulfate bolus IVPB    ? [MAR Hold] penicillin g continuous IV infusion 12 Million Units  (03/18/22 0002)  ? ?PRN Meds:.[MAR Hold] sodium chloride, [MAR Hold] alum & mag hydroxide-simeth, [MAR Hold] bisacodyl, [MAR Hold] calcium carbonate, [MAR Hold] dextrose, [MAR Hold] docusate sodium, [MAR Hold] guaiFENesin-dextromethorphan, [MAR Hold] hydrALAZINE, [MAR Hold]  HYDROmorphone (DILAUDID) injection, [MAR Hold] HYDROmorphone, [MAR Hold] labetalol, [MAR Hold] magnesium sulfate bolus IVPB, [MAR Hold] metoprolol tartrate, [MAR Hold] ondansetron (ZOFRAN) IV, [MAR Hold] ondansetron, [MAR Hold] oxyCODONE, [MAR Hold] polyethylene glycol, [MAR Hold] potassium chloride, [MAR Hold] senna-docusate, [MAR Hold] traMADol ? ?Assessment  ? ?1.  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 ? ?Plan:  ? ?I discussed the procedure with the patient, he is willing to proceed.  Less than 1% risk of aspiration, trauma, esophageal perforation discussed with the patient.  Further recommendations to follow. ? ? ? ?Adrian Prows, MD, [facility] ?03/18/2022, 8:23 AM ?Office: [phone] ?Fax: [phone] ?Pager: [phone]   ?

## 2022-03-18 NOTE — Transfer of Care (Signed)
Immediate Anesthesia Transfer of Care Note ? ?Patient: Eduardo [name] ? ?Procedure(s) Performed: TRANSESOPHAGEAL ECHOCARDIOGRAM (TEE) ? ?Patient Location: PACU ? ?Anesthesia Type:MAC ? ?Level of Consciousness: awake, alert  and oriented ? ?Airway & Oxygen Therapy: Patient Spontanous Breathing ? ?Post-op Assessment: Report given to RN and Post -op Vital signs reviewed and stable ? ?Post vital signs: Reviewed and stable ? ?Last Vitals:  ?Vitals Value Taken Time  ?BP 82/58 03/18/22 0859  ?Temp 36.8 ?C 03/18/22 0855  ?Pulse 70 03/18/22 0900  ?Resp 24 03/18/22 0900  ?SpO2 93 % 03/18/22 0900  ?Vitals shown include unvalidated device data. ?Dr. Kerin Perna aware of BP and phenylephrine requirements.  ?Last Pain:  ?Vitals:  ? 03/18/22 0855  ?TempSrc:   ?PainSc: Asleep  ?   ? ?Patients Stated Pain Goal: 0 (03/14/22 1952) ? ?Complications: No notable events documented. ?

## 2022-03-18 NOTE — TOC Progression Note (Signed)
Transition of Care (TOC) - Progression Note  ? ? ?Patient Details  ?Name: Eduardo [name] ?MRN: [id number] ?Date of Birth: 1956-07-09 ? ?Transition of Care (TOC) CM/SW Contact  ?Zenon Mayo, RN ?Phone Number: ?03/18/2022, 4:12 PM ? ?Clinical Narrative:    ? TEE now, IV ABX, has R AKA this admit, ID wants patient on penicillian vs rocephin, awaiting CIR.  TOC will continue to follow for dc needs.  ? ? ?  ?  ? ?Expected Discharge Plan and Services ?  ?  ?  ?  ?  ?                ?  ?  ?  ?  ?  ?  ?  ?  ?  ?  ? ? ?Social Determinants of Health (SDOH) Interventions ?  ? ?Readmission Risk Interventions ?   ? View : No data to display.  ?  ?  ?  ? ? ?

## 2022-03-18 NOTE — Progress Notes (Signed)
Physical Therapy Treatment ?Patient Details ?Name: Eduardo [name] ?MRN: [id number] ?DOB: 23-Nov-1955 ?Today's Date: 03/18/2022 ? ? ?History of Present Illness 66 y/o M presenting to [facility] on 4/30 with sepsis with exposed necrotic bone and ulcer over the base of the 5th met. Also with cellulitis extending up the peroneal tendons. 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 History of CAD s/p triple-vessel CABG in 2017, HTN, HLD, V3XT, systolic HF, osteomyelitis and partial R foot amputation. ? ?  ?PT Comments  ? ? Pt sitting EoB after working with OT. Agreeable to try to stand in [place] to get up to recliner and then work on sit<>stand with RW. Pt is min A for power up to [place]. Once in chair MD arrived for visit. PT returned after MD left and attempted sit<>stand from chair to RW, pt unable to achieve. Pt reporting fatigue from procedure this morning. Pt able to perform seated exercise. Pt received insurance authorization for AIR follow up. Pt hopeful for d/c to AIR tomorrow.  ?   ?Recommendations for follow up therapy are one component of a multi-disciplinary discharge planning process, led by the attending physician.  Recommendations may be updated based on patient status, additional functional criteria and insurance authorization. ? ?Follow Up Recommendations ? Acute inpatient rehab (3hours/day) ?  ?  ?Assistance Recommended at Discharge Intermittent Supervision/Assistance  ?Patient can return home with the following A lot of help with walking and/or transfers;A little help with bathing/dressing/bathroom;Assistance with cooking/housework;Assist for transportation;Help with stairs or ramp for entrance ?  ?Equipment Recommendations ? Wheelchair cushion (measurements PT);Wheelchair (measurements PT);BSC/3in1  ?  ?Recommendations for Other Services Rehab consult ? ? ?  ?Precautions / Restrictions Precautions ?Precautions: Fall;Other (comment) ?Precaution Comments: R BKA ?Required Braces or Orthoses: Other Brace ?Other Brace: limb  protector ?Restrictions ?Weight Bearing Restrictions: Yes ?RLE Weight Bearing: Non weight bearing  ?  ? ?Mobility ? Bed Mobility ?Overal bed mobility: Modified Independent ?  ?  ?  ?  ?  ?  ?  ?  ? ?Transfers ?Overall transfer level: Needs assistance ?Equipment used: Ambulation equipment used, Rolling walker (2 wheels) ?Transfers: Sit to/from Stand ?Sit to Stand: Min assist, Total assist ?  ?  ?  ?  ?  ?General transfer comment: min A for power up to [facility] for transfer to recliner, attempted x2 sit<>stand with RW with total A and unable to reach fully upright ?Transfer via Lift Equipment: [place] ? ?  ? ? ?  ?Balance Overall balance assessment: Needs assistance ?Sitting-balance support: No upper extremity supported ?Sitting balance-Leahy Scale: Good ?  ?  ?Standing balance support: Bilateral upper extremity supported, Single extremity supported ?Standing balance-Leahy Scale: Fair ?Standing balance comment: able to static stand in [place] ?  ?  ?  ?  ?  ?  ?  ?  ?  ?  ?  ?  ? ?  ?Cognition Arousal/Alertness: Awake/alert ?Behavior During Therapy: [facility] for tasks assessed/performed ?Overall Cognitive Status: Within Functional Limits for tasks assessed ?  ?  ?  ?  ?  ?  ?  ?  ?  ?  ?  ?  ?  ?  ?  ?  ?General Comments: appreciative of education about progressing towards prosthetic placement ?  ?  ? ?  ?Exercises Amputee Exercises ?Quad Sets: AROM, Right, 10 reps, Seated ?Gluteal Sets: AROM, Both, 10 reps, Seated ?Hip ABduction/ADduction: AROM, Right, 10 reps, Seated ?Knee Flexion: AROM, Right, 10 reps, Seated ?Knee Extension: AROM, Right, 10 reps, Seated ?Straight Leg  Raises: AROM, Right, 10 reps, Seated ? ?  ?General Comments General comments (skin integrity, edema, etc.): VSS on RA ?  ?  ? ?Pertinent Vitals/Pain Pain Assessment ?Pain Assessment: Faces ?Faces Pain Scale: Hurts a little bit ?Pain Location: rt residual limb ?Pain Descriptors / Indicators: Aching, Discomfort ?Pain Intervention(s): Limited activity within  patient's tolerance, Monitored during session, Repositioned  ? ? ? ?PT Goals (current goals can now be found in the care plan section) Acute Rehab PT Goals ?Patient Stated Goal: To return home ?PT Goal Formulation: With patient ?Time For Goal Achievement: 03/26/22 ?Potential to Achieve Goals: Good ?Progress towards PT goals: Progressing toward goals ? ?  ?Frequency ? ? ? Min 4X/week ? ? ? ?  ?PT Plan Current plan remains appropriate  ? ? ?   ?AM-PAC PT "6 Clicks" Mobility   ?Outcome Measure ? Help needed turning from your back to your side while in a flat bed without using bedrails?: A Little ?Help needed moving from lying on your back to sitting on the side of a flat bed without using bedrails?: A Little ?Help needed moving to and from a bed to a chair (including a wheelchair)?: A Little ?Help needed standing up from a chair using your arms (e.g., wheelchair or bedside chair)?: A Lot ?Help needed to walk in hospital room?: Total ?Help needed climbing 3-5 steps with a railing? : Total ?6 Click Score: 13 ? ?  ?End of Session Equipment Utilized During Treatment: Gait belt ?Activity Tolerance: Patient tolerated treatment well ?Patient left: in chair;with call bell/phone within reach ?Nurse Communication: Mobility status;Need for lift equipment ?PT Visit Diagnosis: Unsteadiness on feet (R26.81);Other abnormalities of gait and mobility (R26.89) ?  ? ? ?Time: 8563-1497 0263-7858 ?PT Time Calculation (min) (ACUTE ONLY): 23 min ? ?Charges:  $Therapeutic Exercise: 8-22 mins ?$Therapeutic Activity: 8-22 mins          ?          ? ?Shahed Yeoman B. Migdalia Dk PT, DPT ?Acute Rehabilitation Services ?Please use secure chat or  ?Call Office [phone] ? ? ? ?[place] ?03/18/2022, 4:22 PM ? ?

## 2022-03-18 NOTE — Progress Notes (Signed)
Occupational Therapy Treatment ?Patient Details ?Name: Eduardo [name] ?MRN: [id number] ?DOB: 1956-08-25 ?Today's Date: 03/18/2022 ? ? ?History of present illness 66 y/o M presenting to [facility] on 4/30 with sepsis with exposed necrotic bone and ulcer over the base of the 5th met. Also with cellulitis extending up the peroneal tendons. 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 History of CAD s/p triple-vessel CABG in 2017, HTN, HLD, W0JW, systolic HF, osteomyelitis and partial R foot amputation. ?  ?OT comments ? Patient continues to make steady progress towards goals in skilled OT session. Patient's session encompassed  lower body dressing, grooming tasks sitting EOB, and assessment of BP due to softer numbers post TEE. Patient set up lower body dressing, and demonstrating no dizziness with transition EOB, see BP numbers below. Patient handed off to PT at end of session to address mobility. OT continuing to recommending AIR to address functional deficits and return to PLOF.  ? ?BP in supine: 93/62 (73) ?BP sitting EOB: 96/80 (86) ?BP sitting after 3 minutes: 117/78 (83)  ? ?Recommendations for follow up therapy are one component of a multi-disciplinary discharge planning process, led by the attending physician.  Recommendations may be updated based on patient status, additional functional criteria and insurance authorization. ?   ?Follow Up Recommendations ? Acute inpatient rehab (3hours/day)  ?  ?Assistance Recommended at Discharge Intermittent Supervision/Assistance  ?Patient can return home with the following ? A little help with bathing/dressing/bathroom;A lot of help with walking and/or transfers;Assist for transportation;Help with stairs or ramp for entrance ?  ?Equipment Recommendations ? Wheelchair (measurements OT);Wheelchair cushion (measurements OT)  ?  ?Recommendations for Other Services   ? ?  ?Precautions / Restrictions Precautions ?Precautions: Fall;Other (comment) ?Precaution Comments: R BKA ?Required Braces or Orthoses: Other  Brace ?Other Brace: limb protector  ? ? ?  ? ?Mobility Bed Mobility ?Overal bed mobility: Modified Independent ?Bed Mobility: Supine to Sit ?  ?  ?  ?  ?  ?General bed mobility comments: use of bed rails ?  ? ?Transfers ?  ?  ?  ?  ?  ?  ?  ?  ?  ?General transfer comment: Handed off to PT prior to attempting ?  ?  ?Balance Overall balance assessment: Needs assistance ?Sitting-balance support: No upper extremity supported ?Sitting balance-Leahy Scale: Good ?  ?  ?  ?  ?  ?  ?  ?  ?  ?  ?  ?  ?  ?  ?  ?  ?   ? ?ADL either performed or assessed with clinical judgement  ? ?ADL   ?  ?  ?Grooming: Set up;Sitting ?  ?  ?  ?  ?  ?  ?  ?Lower Body Dressing: Sitting/lateral leans;Set up ?Lower Body Dressing Details (indicate cue type and reason): able to don shoe and sock on L foot ?  ?  ?  ?  ?  ?  ?Functional mobility during ADLs: Set up ?General ADL Comments: set up for EOB activites, handed off to PT at end of session ?  ? ?Extremity/Trunk Assessment   ?  ?  ?  ?  ?  ? ?Vision   ?  ?  ?Perception   ?  ?Praxis   ?  ? ?Cognition Arousal/Alertness: Awake/alert ?Behavior During Therapy: [facility] for tasks assessed/performed ?Overall Cognitive Status: Within Functional Limits for tasks assessed ?  ?  ?  ?  ?  ?  ?  ?  ?  ?  ?  ?  ?  ?  ?  ?  ?  ?  ?  ?   ?  Exercises   ? ?  ?Shoulder Instructions   ? ? ?  ?General Comments    ? ? ?Pertinent Vitals/ Pain       Pain Assessment ?Pain Assessment: Faces ?Faces Pain Scale: Hurts a little bit ?Pain Location: rt residual limb ?Pain Descriptors / Indicators: Aching, Discomfort ?Pain Intervention(s): Limited activity within patient's tolerance, Monitored during session, Repositioned ? ?Home Living   ?  ?  ?  ?  ?  ?  ?  ?  ?  ?  ?  ?  ?  ?  ?  ?  ?  ?  ? ?  ?Prior Functioning/Environment    ?  ?  ?  ?   ? ?Frequency ? Min 2X/week  ? ? ? ? ?  ?Progress Toward Goals ? ?OT Goals(current goals can now be found in the care plan section) ? Progress towards OT goals: Progressing toward  goals ? ?Acute Rehab OT Goals ?Patient Stated Goal: get to rehab ?OT Goal Formulation: With patient ?Time For Goal Achievement: 03/26/22 ?Potential to Achieve Goals: Good  ?Plan Discharge plan remains appropriate   ? ?Co-evaluation ? ? ?   ?  ?  ?  ?  ? ?  ?AM-PAC OT "6 Clicks" Daily Activity     ?Outcome Measure ? ? Help from another person eating meals?: None ?Help from another person taking care of personal grooming?: A Little ?Help from another person toileting, which includes using toliet, bedpan, or urinal?: A Lot ?Help from another person bathing (including washing, rinsing, drying)?: A Lot ?Help from another person to put on and taking off regular upper body clothing?: A Little ?Help from another person to put on and taking off regular lower body clothing?: A Lot ?6 Click Score: 16 ? ?  ?End of Session   ? ?OT Visit Diagnosis: Unsteadiness on feet (R26.81);Other abnormalities of gait and mobility (R26.89);Muscle weakness (generalized) (M62.81) ?  ?Activity Tolerance Patient tolerated treatment well ?  ?Patient Left Other (comment) (Handed off to PT sitting EOB) ?  ?Nurse Communication Mobility status ?  ? ?   ? ?Time: 9390-3009 ?OT Time Calculation (min): 13 min ? ?Charges: OT General Charges ?$OT Visit: 1 Visit ?OT Treatments ?$Self Care/Home Management : 8-22 mins ? ?Corinne Ports E. Amellia Panik, OTR/L ?Acute Rehabilitation Services ?[phone] ?[phone]  ? ?Corinne Ports Delainey Winstanley ?03/18/2022, 3:21 PM ?

## 2022-03-18 NOTE — Progress Notes (Signed)
Brief history: Patient with diabetes mellitus, hypertension, hyperlipidemia, obesity, peripheral arterial disease admitted with sepsis, underwent right below-knee amputation. ? ?Due to positive blood cultures, he was referred for evaluation of endocarditis.  Echocardiogram revealed new onset thickening of the aortic valve, with a suspicion for endocarditis, he is now set up for TEE. ? ?Subjective:  ?No chest pain or shortness of breath.  No discomfort in his lower extremity. ? ?Intake/Output from previous day: ? ?I/O last 3 completed shifts: ?In: 1400 [P.O.:1400] ?Out: 3925 [RAXEN:4076] ?Total I/O ?In: 0  ?Out: 675 [Urine:675] ?Net IO Since Admission: -391.52 mL [03/18/22 0823]  ?Blood pressure (!) 90/52, pulse 80, temperature (!) 97.5 ?F (36.4 ?C), temperature source Temporal, resp. rate 20, height 5' 9"  (1.753 m), weight 118 kg, SpO2 97 %. ?Physical Exam ?Constitutional:   ?   Appearance: He is obese.  ?Neck:  ?   Vascular: No JVD.  ?Cardiovascular:  ?   Rate and Rhythm: Normal rate and regular rhythm.  ?   Heart sounds: Normal heart sounds. No murmur heard. ?  No gallop.  ?Pulmonary:  ?   Effort: Pulmonary effort is normal.  ?   Breath sounds: Normal breath sounds.  ?Abdominal:  ?   General: Bowel sounds are normal.  ?   Palpations: Abdomen is soft.  ?Musculoskeletal:     ?   General: Deformity (right BKA) present.  ?   Left lower leg: No edema.  ? ? ?Lab Results: ?BMP ?BNP (last 3 results) ?No results for input(s): BNP in the last 8760 hours. ? ?ProBNP (last 3 results) ?No results for input(s): PROBNP in the last 8760 hours. ? ?  Latest Ref Rng & Units 03/18/2022  ?  4:16 AM 03/16/2022  ?  3:41 AM 03/15/2022  ?  2:33 AM  ?BMP  ?Glucose 70 - 99 mg/dL 212   120   158    ?BUN 8 - 23 mg/dL 33   34   38    ?Creatinine 0.61 - 1.24 mg/dL 1.57   1.61   1.66    ?Sodium 135 - 145 mmol/L 139   139   140    ?Potassium 3.5 - 5.1 mmol/L 3.9   3.8   4.0    ?Chloride 98 - 111 mmol/L 103   105   106    ?CO2 22 - 32 mmol/L 28   25    25     ?Calcium 8.9 - 10.3 mg/dL 8.3   8.2   8.3    ? ? ?  Latest Ref Rng & Units 03/11/2022  ?  2:54 AM 03/08/2022  ?  2:40 PM 12/28/2020  ? 11:08 AM  ?Hepatic Function  ?Total Protein 6.5 - 8.1 g/dL 5.9   6.3   7.0    ?Albumin 3.5 - 5.0 g/dL 2.1   2.4   3.9    ?AST 15 - 41 U/L 26   28   50    ?ALT 0 - 44 U/L 30   24   36    ?Alk Phosphatase 38 - 126 U/L 63   71   63    ?Total Bilirubin 0.3 - 1.2 mg/dL 0.6   1.0   0.8    ? ? ?  Latest Ref Rng & Units 03/18/2022  ?  4:16 AM 03/16/2022  ?  3:41 AM 03/11/2022  ?  2:54 AM  ?CBC  ?WBC 4.0 - 10.5 K/uL 7.0   8.9   12.7    ?  Hemoglobin 13.0 - 17.0 g/dL 12.9   12.9   13.9    ?Hematocrit 39.0 - 52.0 % 38.5   38.2   41.8    ?Platelets 150 - 400 K/uL 287   318   305    ? ?Lipid Panel  ?   ?Component Value Date/Time  ? CHOL 96 (L) 08/18/2021 1538  ? TRIG 138 08/18/2021 1538  ? HDL 30 (L) 08/18/2021 1538  ? CHOLHDL 3.9 06/26/2021 0949  ? CHOLHDL 4.4 09/28/2011 1358  ? VLDL 21 09/28/2011 1358  ? Bowen 42 08/18/2021 1538  ? ?Cardiac Panel (last 3 results) ?No results for input(s): CKTOTAL, CKMB, TROPONINI, RELINDX in the last 72 hours. ? ?HEMOGLOBIN A1C ?Lab Results  ?Component Value Date  ? HGBA1C 7.3 (H) 03/11/2022  ? MPG 162.81 03/11/2022  ? ?TSH ?No results for input(s): TSH in the last 8760 hours. ?Imaging: ?Imaging results have been reviewed ? ?Cardiac Studies: ? ?Echocardiogram 03/17/2022:  ?1. Left ventricular ejection fraction, by estimation, is 30 to 35%. Left ventricular ejection fraction by PLAX is 37 %. The left ventricle has moderate to severely decreased function. The left ventricle demonstrates global hypokinesis. The left  ?ventricular internal cavity size was mildly dilated. Left ventricular diastolic parameters are indeterminate. Elevated left ventricular end-diastolic pressure. There is akinesis of the left ventricular, entire inferolateral wall. ? 2. Right ventricular systolic function is normal. The right ventricular size is normal. ? 3. Left atrial size was  moderately dilated. ? 4. The mitral valve is normal in structure. Mild to moderate mitral valve regurgitation. No evidence of mitral stenosis. ? 5. The aortic valve appears myxomatous. There is no obvious vegetation.. The aortic valve is tricuspid. Aortic valve regurgitation is not visualized. Aortic valve sclerosis is present, with no evidence of aortic valve stenosis. ? ?Comparison(s): No significant change from prior study. 12/29/2020. AV thickness is new. Poor echo window. Recommend TEE if clinically indicated. ? ? ?Scheduled Meds: ? [MAR Hold] acetaminophen  650 mg Oral Q8H  ? [MAR Hold] acidophilus  1 capsule Oral Daily  ? [MAR Hold] vitamin C  1,000 mg Oral Daily  ? [MAR Hold] aspirin EC  81 mg Oral Daily  ? [MAR Hold] cholecalciferol  2,000 Units Oral Daily  ? [MAR Hold] clopidogrel  75 mg Oral Daily  ? [MAR Hold] docusate sodium  100 mg Oral Daily  ? [MAR Hold] enoxaparin (LOVENOX) injection  60 mg Subcutaneous Q24H  ? [MAR Hold] ezetimibe  10 mg Oral Daily  ? [MAR Hold] gemfibrozil  600 mg Oral BID AC  ? [MAR Hold] insulin aspart  0-20 Units Subcutaneous TID WC  ? [MAR Hold] insulin aspart  0-5 Units Subcutaneous QHS  ? [MAR Hold] insulin aspart  3 Units Subcutaneous TID WC  ? [MAR Hold] insulin glargine-yfgn  40 Units Subcutaneous Daily  ? [MAR Hold] mouth rinse  15 mL Mouth Rinse BID  ? [MAR Hold] multivitamin with minerals  1 tablet Oral Daily  ? [MAR Hold] nutrition supplement (JUVEN)  1 packet Oral BID BM  ? [MAR Hold] pantoprazole  40 mg Oral Daily  ? [MAR Hold] rosuvastatin  20 mg Oral Daily  ? [MAR Hold] sacubitril-valsartan  1 tablet Oral BID  ? [MAR Hold] torsemide  20 mg Oral BID  ? [MAR Hold] triamcinolone  1 spray Each Nare Daily  ? [MAR Hold] zinc sulfate  220 mg Oral Daily  ? ?Continuous Infusions: ? [MAR Hold] sodium chloride 10 mL/hr at 03/15/22 1154  ?  sodium chloride    ? [MAR Hold] magnesium sulfate bolus IVPB    ? [MAR Hold] penicillin g continuous IV infusion 12 Million Units  (03/18/22 0002)  ? ?PRN Meds:.[MAR Hold] sodium chloride, [MAR Hold] alum & mag hydroxide-simeth, [MAR Hold] bisacodyl, [MAR Hold] calcium carbonate, [MAR Hold] dextrose, [MAR Hold] docusate sodium, [MAR Hold] guaiFENesin-dextromethorphan, [MAR Hold] hydrALAZINE, [MAR Hold]  HYDROmorphone (DILAUDID) injection, [MAR Hold] HYDROmorphone, [MAR Hold] labetalol, [MAR Hold] magnesium sulfate bolus IVPB, [MAR Hold] metoprolol tartrate, [MAR Hold] ondansetron (ZOFRAN) IV, [MAR Hold] ondansetron, [MAR Hold] oxyCODONE, [MAR Hold] polyethylene glycol, [MAR Hold] potassium chloride, [MAR Hold] senna-docusate, [MAR Hold] traMADol ? ?Assessment  ? ?1.  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 ? ?Plan:  ? ?I discussed the procedure with the patient, he is willing to proceed.  Less than 1% risk of aspiration, trauma, esophageal perforation discussed with the patient.  Further recommendations to follow. ? ? ? ?Adrian Prows, MD, [facility] ?03/18/2022, 8:23 AM ?Office: [phone] ?Fax: [phone] ?Pager: [phone]   ?

## 2022-03-18 NOTE — Progress Notes (Signed)
Inpatient Diabetes Program Recommendations ? ?AACE/ADA: New Consensus Statement on Inpatient Glycemic Control (2015) ? ?Target Ranges:  Prepandial:   less than 140 mg/dL ?     Peak postprandial:   less than 180 mg/dL (1-2 hours) ?     Critically ill patients:  140 - 180 mg/dL  ? ?Lab Results  ?Component Value Date  ? GLUCAP 170 (H) 03/18/2022  ? HGBA1C 7.3 (H) 03/11/2022  ? ? ?Review of Glycemic Control ? Latest Reference Range & Units 03/17/22 16:17 03/17/22 21:00 03/18/22 06:28 03/18/22 08:56 03/18/22 11:13  ?Glucose-Capillary 70 - 99 mg/dL 241 (H) 252 (H) 216 (H) 166 (H) 170 (H)  ?(H): Data is abnormally high ?Diabetes history: Type 2 DM ?Outpatient Diabetes medications: Humalog 0-10 units TID, NPH 30 units QA/ 15 units QP ?Current orders for Inpatient glycemic control: Novolog 0-20 units TID & HS, Semglee 40 units QD, Novolog 3 units TID ? ?Inpatient Diabetes Program Recommendations:   ? ?Consider increasing Semglee to 45 units QHS.  ? ?Thanks, ?Bronson Curb, MSN, RNC-OB ?Diabetes Coordinator ?[phone] (8a-5p) ? ? ? ?

## 2022-03-18 NOTE — CV Procedure (Signed)
TEE: Under MAC anesthesia, TEE was performed without complications: ?LV: Markedly dilated. EF 20%, global hypo. ?RV: Normal ?LA: Dilated. Left atrial appendage: Normal without thrombus. Normal function. Inter atrial septum is intact without defect.   ?RA: Normal ? ?MV: Normal Mitral annular dilatation and central mild to at most moderate MR. ?TV: Normal Trace TR ?AV: Mild calcific thickening of the right coronary cusp. No vegetation. Trace AI.  ?PV: Normal. Trace PI. ? ?Thoracic and ascending aorta: Normal without significant plaque or atheromatous changes. ? ?Impression: No vegetations visualized. Findings consistent with dilated cardiomyopathy. ? ? ?Eduardo Prows, MD, [facility] ?03/18/2022, 8:48 AM ?Office: [phone] ?Fax: [phone] ?Pager: [phone]  ?

## 2022-03-18 NOTE — Progress Notes (Signed)
?  Echocardiogram ?Echocardiogram Transesophageal has been performed. ? Eduardo [name] ?03/18/2022, 9:06 AM ?

## 2022-03-18 NOTE — Progress Notes (Signed)
Mobility Specialist Progress Note: ? ? 03/18/22 1710  ?Mobility  ?Activity Transferred to/from [facility]  ?Level of Assistance Modified independent, requires aide device or extra time  ?Assistive Device Stedy  ?Activity Response Tolerated well  ?$Mobility charge 1 Mobility  ? ?Pt requesting to transfer to [facility]. Required modA to stand with stedy from low recliner. Pt left with call bell, will f/u to get back to chair.  ? ?Nelta Numbers ?Acute Rehab ?Phone: [phone] ?Office Phone: [phone] ? ?

## 2022-03-18 NOTE — Progress Notes (Signed)
Mobility Specialist Progress Note: ? ? 03/18/22 1730  ?Mobility  ?Activity Transferred to/from [facility]  ?Level of Assistance Standby assist, set-up cues, supervision of patient - no hands on  ?Assistive Device Stedy  ?Activity Response Tolerated well  ?$Mobility charge 1 Mobility  ? ?Pt with successful BM on BSC. Required no physical assistance to stand in stedy from [facility]. Pt tolerated well. Left in bed with all needs met.  ? ?Nelta Numbers ?Acute Rehab ?Phone: [phone] ?Office Phone: [phone] ? ?

## 2022-03-18 NOTE — Progress Notes (Signed)
Inpatient Rehab Admissions Coordinator:  ? ?Expedited appeal complete and we have received approval for CIR.  Unfortunately, I do not have a bed for this patient to admit today, but will follow for admit pending bed availability.   ? ?Shann Medal, PT, DPT ?Admissions Coordinator ?[phone] ?03/18/22  ?10:56 AM ? ?

## 2022-03-18 NOTE — Interval H&P Note (Signed)
History and Physical Interval Note: ? ?03/18/2022 ?8:30 AM ? ?ANTRONE WALLA  has presented today for surgery, with the diagnosis of bacteremia.  The various methods of treatment have been discussed with the patient and family. After consideration of risks, benefits and other options for treatment, the patient has consented to  Procedure(s): ?TRANSESOPHAGEAL ECHOCARDIOGRAM (TEE) (N/A) as a surgical intervention.  The patient's history has been reviewed, patient examined, no change in status, stable for surgery.  I have reviewed the patient's chart and labs.  Questions were answered to the patient's satisfaction.   ? ? ?Eduardo [name] ? ? ?

## 2022-03-18 NOTE — Anesthesia Postprocedure Evaluation (Signed)
Anesthesia Post Note ? ?Patient: Eduardo [name] ? ?Procedure(s) Performed: TRANSESOPHAGEAL ECHOCARDIOGRAM (TEE) ? ?  ? ?Patient location during evaluation: Endoscopy ?Anesthesia Type: MAC ?Level of consciousness: awake and alert ?Pain management: pain level controlled ?Vital Signs Assessment: post-procedure vital signs reviewed and stable ?Respiratory status: spontaneous breathing, nonlabored ventilation and respiratory function stable ?Cardiovascular status: blood pressure returned to baseline and stable ?Postop Assessment: no apparent nausea or vomiting ?Anesthetic complications: no ? ? ?No notable events documented. ? ?Last Vitals:  ?Vitals:  ? 03/18/22 1100 03/18/22 1200  ?BP: (!) 100/56 (!) 95/56  ?Pulse: 76 71  ?Resp:    ?Temp:    ?SpO2: 95% 96%  ?  ?Last Pain:  ?Vitals:  ? 03/18/22 1038  ?TempSrc: Oral  ?PainSc: 0-No pain  ? ? ?  ?  ?  ?  ?  ?  ? ?Lidia Collum ? ? ? ? ?

## 2022-03-18 NOTE — Progress Notes (Signed)
OT Cancellation Note ? ?Patient Details ?Name: Eduardo [name] ?MRN: [id number] ?DOB: Jan 08, 1956 ? ? ?Cancelled Treatment:    Reason Eval/Treat Not Completed: Patient at procedure or test/ unavailable Patient off floor for TEE, OT will follow back as time permits.  ? ?Corinne Ports E. Duquan Gillooly, OTR/L ?Acute Rehabilitation Services ?[phone] ?[phone]  ? ?Corinne Ports Andjela Wickes ?03/18/2022, 8:36 AM ?

## 2022-03-18 NOTE — Progress Notes (Addendum)
ID Brief Note  ? ?TEE prelim with no concern for vegetations or endocarditis  ? ?Recommend to switch pen G to amoxicillin  ?Duration 2 weeks course total ( IV Pen G and PO Amoxicillin). EOT 03/21/22 ?D/w ID pharm D ?ID will sign off, please call with questions ? ?Rosiland Oz, MD ?Infectious Disease Physician ?[facility] ?[address] ?[place], [place] [address] ?Phone: [phone]  Fax: [phone] ? ?

## 2022-03-18 NOTE — Progress Notes (Signed)
?PROGRESS NOTE ? ? ? ?Eduardo [name]  WUJ:[id number] DOB: [birth year] DOA: 03/08/2022 ?PCP: Rita Ohara, MD  ? ?Eduardo [name] is a 66 y.o. M with CAD s/p CABG 2017, PVD s/p R SFA stent 2020, DM, chronic heart failure EF 15-20%, chronic kidney disease and prior right partial foot amputation who presented with few days fever, chills, malaise and painful swollen right foot wound.  In the ED he was noted to be in shock, started on peripheral Levophed and admitted to the ICU on vancomycin and Cefepime. ?-4/30: Admitted to ICU ?5/1: Ortho consulted, noted exposed bone at base of 5th metatarsal and cellulitis up peroneal tendons ?5/2: MRI shows osteo, tenosynovitis; able to stop Levophed that night ?5/3: To OR for RIGHT BKA by Dr. Sharol Given with Martin Majestic, pressors resumed post-op ?5/4: Off pressors again ?5/5: +12.9L on admission, diuresis started ?5/6: Switched back to home oral diuretic ?5/8: ID consulted, TTE ordered, narrowed to penicillin ?  ? ?Subjective: ?-back from TEE, feels ok, constipated ? ?Assessment and Plan: ? ?Septic shock (HCC)-poa ?-Secondary to strep bacteremia, diabetic foot infection ?-Treated with Levophed for several days in the ICU, hemodynamics improved back to baseline. ?-Now improved, antibiotics switched to amoxicillin ?-CIR for rehab ? ?Osteomyelitis of right foot ([place]) ?S/p right BKA by Dr. Sharol Given on 5/4 ?-wound vac, pain control ? ?Streptococcal bacteremia ?Blood cultures on admission grew Streptococcus anginosus, source is likely diabetic foot infection. ID consulted, ?-Repeat blood cultures are negative ?-TEE today negative for endocarditis ?-change to oral amoxicillin for 3 more days  (to complete 2-week course). ? ?Acute on chronic combined systolic and diastolic CHF (congestive heart failure) ([place]) ?Patient's baseline EF is less than 20% due to ischemic heart disease. ?- now euvolemic, Continue home torsemide, Entresto ?- Hold Imdur, jardiance stopped by endo in Nov 22 ? ?Hypophosphatemia ?Supplemented  and resolved ? ?AKI (acute kidney injury) on CKD3a ?Creatinine 2.1 on admission, improved ?-baseline creatinine 1.5-1.8 ? ?Essential hypertension, benign ?Blood pressure soft still.  Baseline BP 95-621 systolic given low EF. ?- Endo carvedilol and Jardiance on hold ? ?Controlled type 2 diabetes mellitus with hyperglycemia, with long-term current use of insulin ([place]) ?Glucose overall controlled. ?- Continue glargine, SSI ? ?Coronary artery disease involving native coronary artery of native heart without angina pectoris ?- Continue Zetia, gemfibrozil, Crestor ?- Continue aspirin, Plavix ?- Hold Coreg, Imdur, Entresto, Jardiance ? ?PAD (peripheral artery disease) ([place]) ?- Continue Crestor, Zetia, gemfibrozil ?- Contineu aspirin, plavix ? ?Constipation ?-add senokot, dulcolax supp, increase miralax to BID ? ?Morbid obesity with BMI of 40.0-44.9, adult ([place]) ?BMI 42 ? ? ?DVT prophylaxis: Lovenox ?Code Status: Partial code ?Family Communication: ?Disposition Plan: Home 1 to 2 days ? ?Consultants:  ? ? ?Procedures: 5/3: R BKA ?TEE 5/21, negative for endocarditis ? ? ?Objective: ?Vitals:  ? 03/18/22 1100 03/18/22 1200 03/18/22 1221 03/18/22 1300  ?BP: (!) 100/56 (!) 95/56 (!) 88/53 (!) 94/58  ?Pulse: 76 71    ?Resp:      ?Temp:      ?TempSrc:      ?SpO2: 95% 96% 97% 95%  ?Weight:      ?Height:      ? ? ?Intake/Output Summary (Last 24 hours) at 03/18/2022 1434 ?Last data filed at 03/18/2022 0900 ?Gross per 24 hour  ?Intake 390 ml  ?Output 2900 ml  ?Net -2510 ml  ? ?Filed Weights  ? 03/17/22 0209 03/18/22 0332 03/18/22 0820  ?Weight: 118.7 kg 118.6 kg 118 kg  ? ? ?Examination: ? ? ? ? ? ?  Data Reviewed:  ? ?CBC: ?Recent Labs  ?Lab 03/16/22 ?2426 03/18/22 ?0416  ?WBC 8.9 7.0  ?HGB 12.9* 12.9*  ?HCT 38.2* 38.5*  ?MCV 89.5 90.0  ?PLT 318 287  ? ?Basic Metabolic Panel: ?Recent Labs  ?Lab 03/13/22 ?8341 03/14/22 ?9622 03/15/22 ?2979 03/16/22 ?8921 03/18/22 ?0416  ?NA 136 137 140 139 139  ?K 4.7 4.2 4.0 3.8 3.9  ?CL 106 105 106 105  103  ?CO2 '23 23 25 25 28  '$ ?GLUCOSE 154* 129* 158* 120* 212*  ?BUN 48* 45* 38* 34* 33*  ?CREATININE 1.82* 1.72* 1.66* 1.61* 1.57*  ?CALCIUM 8.1* 8.0* 8.3* 8.2* 8.3*  ?PHOS 2.8  --   --   --   --   ? ?GFR: ?Estimated Creatinine Clearance: 58.7 mL/min (A) (by C-G formula based on SCr of 1.57 mg/dL (H)). ?Liver Function Tests: ?No results for input(s): AST, ALT, ALKPHOS, BILITOT, PROT, ALBUMIN in the last 168 hours. ?No results for input(s): LIPASE, AMYLASE in the last 168 hours. ?No results for input(s): AMMONIA in the last 168 hours. ?Coagulation Profile: ?No results for input(s): INR, PROTIME in the last 168 hours. ?Cardiac Enzymes: ?No results for input(s): CKTOTAL, CKMB, CKMBINDEX, TROPONINI in the last 168 hours. ?BNP (last 3 results) ?No results for input(s): PROBNP in the last 8760 hours. ?HbA1C: ?No results for input(s): HGBA1C in the last 72 hours. ?CBG: ?Recent Labs  ?Lab 03/17/22 ?1617 03/17/22 ?2100 03/18/22 ?1941 03/18/22 ?7408 03/18/22 ?1113  ?GLUCAP 241* 252* 216* 166* 170*  ? ?Lipid Profile: ?No results for input(s): CHOL, HDL, LDLCALC, TRIG, CHOLHDL, LDLDIRECT in the last 72 hours. ?Thyroid Function Tests: ?No results for input(s): TSH, T4TOTAL, FREET4, T3FREE, THYROIDAB in the last 72 hours. ?Anemia Panel: ?No results for input(s): VITAMINB12, FOLATE, FERRITIN, TIBC, IRON, RETICCTPCT in the last 72 hours. ?Urine analysis: ?   ?Component Value Date/Time  ? BILIRUBINUR negative 06/26/2021 1300  ? BILIRUBINUR NEG 10/13/2012 0925  ? KETONESUR negative 06/26/2021 1300  ? Drain SMALL 10/13/2012 0925  ? UROBILINOGEN 0.2 06/26/2021 1300  ? NITRITE Negative 06/26/2021 1300  ? NITRITE NEG 10/13/2012 0925  ? LEUKOCYTESUR Negative 06/26/2021 1300  ? ?Sepsis Labs: ?'@LABRCNTIP'$ (procalcitonin:4,lacticidven:4) ? ?) ?Recent Results (from the past 240 hour(s))  ?MRSA Next Gen by PCR, Nasal     Status: None  ? Collection Time: 03/08/22 10:33 PM  ? Specimen: Nasal Mucosa; Nasal Swab  ?Result Value Ref Range Status  ?  MRSA by PCR Next Gen NOT DETECTED NOT DETECTED Final  ?  Comment: (NOTE) ?The GeneXpert MRSA Assay (FDA approved for NASAL specimens only), ?is one component of a comprehensive MRSA colonization surveillance ?program. It is not intended to diagnose MRSA infection nor to guide ?or monitor treatment for MRSA infections. ?Test performance is not FDA approved in patients less than 2 years ?old. ?Performed at [facility], [place] [address], [place], [place] ?[address] ?  ?Surgical pcr screen     Status: None  ? Collection Time: 03/10/22  8:09 PM  ? Specimen: Nasal Mucosa; Nasal Swab  ?Result Value Ref Range Status  ? MRSA, PCR NEGATIVE NEGATIVE Final  ? Staphylococcus aureus NEGATIVE NEGATIVE Final  ?  Comment: (NOTE) ?The Xpert SA Assay (FDA approved for NASAL specimens in patients 61 ?years of age and older), is one component of a comprehensive ?surveillance program. It is not intended to diagnose infection nor to ?guide or monitor treatment. ?Performed at [facility], [place] [address] [place], [place] ?[address] ?  ?Culture, blood (  Routine X 2) w Reflex to ID Panel     Status: None  ? Collection Time: 03/12/22  3:09 PM  ? Specimen: BLOOD  ?Result Value Ref Range Status  ? Specimen Description BLOOD BLOOD RIGHT ARM  Final  ? Special Requests   Final  ?  BOTTLES DRAWN AEROBIC AND ANAEROBIC Blood Culture adequate volume  ? Culture   Final  ?  NO GROWTH 5 DAYS ?Performed at [facility], [place] [address] [place], [place] [address] ?  ? Report Status 03/17/2022 FINAL  Final  ?Culture, blood (Routine X 2) w Reflex to ID Panel     Status: None  ? Collection Time: 03/12/22  3:17 PM  ? Specimen: BLOOD  ?Result Value Ref Range Status  ? Specimen Description BLOOD SITE NOT SPECIFIED  Final  ? Special Requests   Final  ?  BOTTLES DRAWN AEROBIC AND ANAEROBIC Blood Culture adequate volume  ? Culture   Final  ?  NO GROWTH 5 DAYS ?Performed at [facility], [place] [address] [place], [place] [address] ?  ? Report  Status 03/17/2022 FINAL  Final  ?  ? ?Radiology Studies: ?ECHOCARDIOGRAM COMPLETE ? ?Result Date: 03/17/2022 ?   ECHOCARDIOGRAM REPORT   Patient Name:   JAIKOB BORGWARDT Date of Exam: 03/16/2022 Medical Rec #:  [id number]

## 2022-03-18 NOTE — Anesthesia Procedure Notes (Signed)
Procedure Name: [place] ?Date/Time: 03/18/2022 8:41 AM ?Performed by: Dorthea Cove, CRNA ?Pre-anesthesia Checklist: Patient identified, Suction available, Patient being monitored, Timeout performed and Emergency Drugs available ?Patient Re-evaluated:Patient Re-evaluated prior to induction ?Oxygen Delivery Method: Nasal cannula ?Preoxygenation: Pre-oxygenation with 100% oxygen ?Induction Type: IV induction ?Placement Confirmation: positive ETCO2 and CO2 detector ?Dental Injury: Teeth and Oropharynx as per pre-operative assessment  ? ? ? ? ?

## 2022-03-19 ENCOUNTER — Encounter (HOSPITAL_COMMUNITY): Payer: Self-pay | Admitting: Cardiology

## 2022-03-19 DIAGNOSIS — R6521 Severe sepsis with septic shock: Secondary | ICD-10-CM | POA: Diagnosis not present

## 2022-03-19 DIAGNOSIS — A419 Sepsis, unspecified organism: Secondary | ICD-10-CM | POA: Diagnosis not present

## 2022-03-19 LAB — BASIC METABOLIC PANEL
Anion gap: 6 (ref 5–15)
BUN: 33 mg/dL — ABNORMAL HIGH (ref 8–23)
CO2: 27 mmol/L (ref 22–32)
Calcium: 8.1 mg/dL — ABNORMAL LOW (ref 8.9–10.3)
Chloride: 105 mmol/L (ref 98–111)
Creatinine, Ser: 1.51 mg/dL — ABNORMAL HIGH (ref 0.61–1.24)
GFR, Estimated: 51 mL/min — ABNORMAL LOW (ref >60)
Glucose, Bld: 141 mg/dL — ABNORMAL HIGH (ref 70–99)
Potassium: 3.9 mmol/L (ref 3.5–5.1)
Sodium: 138 mmol/L (ref 135–145)

## 2022-03-19 LAB — GLUCOSE, CAPILLARY
Glucose-Capillary: 147 mg/dL — ABNORMAL HIGH (ref 70–99)
Glucose-Capillary: 187 mg/dL — ABNORMAL HIGH (ref 70–99)
Glucose-Capillary: 199 mg/dL — ABNORMAL HIGH (ref 70–99)
Glucose-Capillary: 92 mg/dL (ref 70–99)

## 2022-03-19 LAB — CBC
HCT: 40.3 % (ref 39.0–52.0)
Hemoglobin: 13.1 g/dL (ref 13.0–17.0)
MCH: 29.8 pg (ref 26.0–34.0)
MCHC: 32.5 g/dL (ref 30.0–36.0)
MCV: 91.6 fL (ref 80.0–100.0)
Platelets: 239 10*3/uL (ref 150–400)
RBC: 4.4 MIL/uL (ref 4.22–5.81)
RDW: 14.3 % (ref 11.5–15.5)
WBC: 6 10*3/uL (ref 4.0–10.5)
nRBC: 0 % (ref 0.0–0.2)

## 2022-03-19 NOTE — Progress Notes (Signed)
Physical Therapy Treatment ?Patient Details ?Name: Eduardo [name] ?MRN: [id number] ?DOB: 06-03-1956 ?Today's Date: 03/19/2022 ? ? ?History of Present Illness 66 y/o M presenting to [facility] on 4/30 with sepsis with exposed necrotic bone and ulcer over the base of the 5th met. Also with cellulitis extending up the peroneal tendons. 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 History of CAD s/p triple-vessel CABG in 2017, HTN, HLD, A2ZH, systolic HF, osteomyelitis and partial R foot amputation. ? ?  ?PT Comments  ? ? Pt up in chair on entry, reports not feeling well and is very flat. Pt reports he thinks it might be the anesthesia. PT inquires as to whether he is disappointed about not going to AIR today and pt admits that is part of it. PT educated pt on feelings of grief over limb loss and that is a normal feeling. PT to provide information about amputee support group and peer to peer support. Pt is min guard for power up to [place]. Pt stood in stedy and stretched before sitting back on bed. Mod I for return to bed. Pt asks to not do any further exercise today. D/c plans remain appropriate.  ?   ?Recommendations for follow up therapy are one component of a multi-disciplinary discharge planning process, led by the attending physician.  Recommendations may be updated based on patient status, additional functional criteria and insurance authorization. ? ?Follow Up Recommendations ? Acute inpatient rehab (3hours/day) ?  ?  ?Assistance Recommended at Discharge Intermittent Supervision/Assistance  ?Patient can return home with the following A lot of help with walking and/or transfers;A little help with bathing/dressing/bathroom;Assistance with cooking/housework;Assist for transportation;Help with stairs or ramp for entrance ?  ?Equipment Recommendations ? Wheelchair cushion (measurements PT);Wheelchair (measurements PT);BSC/3in1  ?  ?Recommendations for Other Services Rehab consult ? ? ?  ?Precautions / Restrictions Precautions ?Precautions: Fall;Other  (comment) ?Precaution Comments: R BKA ?Required Braces or Orthoses: Other Brace ?Other Brace: limb protector ?Restrictions ?Weight Bearing Restrictions: Yes ?RLE Weight Bearing: Non weight bearing  ?  ? ?Mobility ? Bed Mobility ?Overal bed mobility: Modified Independent ?  ?  ?  ?  ?  ?  ?  ?  ? ?Transfers ?Overall transfer level: Needs assistance ?Equipment used: Ambulation equipment used, Rolling walker (2 wheels) ?Transfers: Sit to/from Stand ?Sit to Stand: Min assist ?  ?  ?  ?  ?  ?General transfer comment: min A for power up to [facility] for transfer back to bed ?Transfer via Lift Equipment: Stedy ? ? ? ?  ?Balance Overall balance assessment: Needs assistance ?Sitting-balance support: No upper extremity supported ?Sitting balance-Leahy Scale: Good ?  ?  ?Standing balance support: Bilateral upper extremity supported, Single extremity supported ?Standing balance-Leahy Scale: Fair ?Standing balance comment: able to static stand in [place] ?  ?  ?  ?  ?  ?  ?  ?  ?  ?  ?  ?  ? ?  ?Cognition Arousal/Alertness: Awake/alert ?Behavior During Therapy: Flat affect ?Overall Cognitive Status: Within Functional Limits for tasks assessed ?  ?  ?  ?  ?  ?  ?  ?  ?  ?  ?  ?  ?  ?  ?  ?  ?General Comments: pt very flat today, PT validated feelings of loss and frustration ?  ?  ? ?  ?Exercises   ? ?  ?General Comments General comments (skin integrity, edema, etc.): VSS on RA ?  ?  ? ?Pertinent Vitals/Pain Pain Assessment ?Pain Assessment: Faces ?  Faces Pain Scale: Hurts a little bit ?Pain Location: rt residual limb ?Pain Descriptors / Indicators: Aching, Discomfort ?Pain Intervention(s): Limited activity within patient's tolerance, Monitored during session, Repositioned  ? ? ? ?PT Goals (current goals can now be found in the care plan section) Acute Rehab PT Goals ?Patient Stated Goal: To return home ?PT Goal Formulation: With patient ?Time For Goal Achievement: 03/26/22 ?Potential to Achieve Goals: Good ?Progress towards PT goals:  Progressing toward goals ? ?  ?Frequency ? ? ? Min 4X/week ? ? ? ?  ?PT Plan Current plan remains appropriate  ? ? ?   ?AM-PAC PT "6 Clicks" Mobility   ?Outcome Measure ? Help needed turning from your back to your side while in a flat bed without using bedrails?: A Little ?Help needed moving from lying on your back to sitting on the side of a flat bed without using bedrails?: A Little ?Help needed moving to and from a bed to a chair (including a wheelchair)?: A Little ?Help needed standing up from a chair using your arms (e.g., wheelchair or bedside chair)?: A Lot ?Help needed to walk in hospital room?: Total ?Help needed climbing 3-5 steps with a railing? : Total ?6 Click Score: 13 ? ?  ?End of Session Equipment Utilized During Treatment: Gait belt ?Activity Tolerance: Patient tolerated treatment well ?Patient left: with call bell/phone within reach;in bed;with bed alarm set ?Nurse Communication: Mobility status;Need for lift equipment ?PT Visit Diagnosis: Unsteadiness on feet (R26.81);Other abnormalities of gait and mobility (R26.89) ?  ? ? ?Time: 3010-4045 ?PT Time Calculation (min) (ACUTE ONLY): 18 min ? ?Charges:  $Therapeutic Activity: 8-22 mins          ?          ? ?Arryanna Holquin B. Migdalia Dk PT, DPT ?Acute Rehabilitation Services ?Please use secure chat or  ?Call Office [phone] ? ? ? ?[place] ?03/19/2022, 5:25 PM ? ?

## 2022-03-19 NOTE — Care Management Important Message (Signed)
Important Message ? ?Patient Details  ?Name: Eduardo [name] ?MRN: [id number] ?Date of Birth: [birth year] ? ? ?Medicare Important Message Given:  Yes ? ? ? ? ?Shelda Altes ?03/19/2022, 10:03 AM ?

## 2022-03-19 NOTE — TOC Progression Note (Addendum)
Transition of Care (TOC) - Progression Note  ? ? ?Patient Details  ?Name: Eduardo [name] ?MRN: [id number] ?Date of Birth: [birth year] ? ?Transition of Care (TOC) CM/SW Contact  ?Zenon Mayo, RN ?Phone Number: ?03/19/2022, 9:58 AM ? ?Clinical Narrative:    ?Per Caitlen with CIR they will have a bed for patient hopefully by Sunday or Sooner.  TOC will continue to follow for dc needs. ? ? ?  ?  ? ?Expected Discharge Plan and Services ?  ?  ?  ?  ?  ?                ?  ?  ?  ?  ?  ?  ?  ?  ?  ?  ? ? ?Social Determinants of Health (SDOH) Interventions ?  ? ?Readmission Risk Interventions ?   ? View : No data to display.  ?  ?  ?  ? ? ?

## 2022-03-19 NOTE — Progress Notes (Signed)
Inpatient Rehab Admissions Coordinator:  ? ?I have no beds available for this patient to admit to CIR today.  Will continue to follow for timing of potential admission pending bed availability.  ? ?Shann Medal, PT, DPT ?Admissions Coordinator ?[phone] ?03/19/22  ?10:00 AM ? ?

## 2022-03-19 NOTE — Progress Notes (Signed)
?PROGRESS NOTE ? ? ? ?CHERYL STABENOW  PIR:[id number] DOB: Mar 07, 1956 DOA: 03/08/2022 ?PCP: Rita Ohara, MD  ? ?Mr. Pooley is a 66 y.o. M with CAD s/p CABG 2017, PVD s/p R SFA stent 2020, DM, chronic heart failure EF 15-20%, chronic kidney disease and prior right partial foot amputation who presented with few days fever, chills, malaise and painful swollen right foot wound.  In the ED he was noted to be in shock, started on peripheral Levophed and admitted to the ICU on vancomycin and Cefepime. ?-4/30: Admitted to ICU ?5/1: Ortho consulted, noted exposed bone at base of 5th metatarsal and cellulitis up peroneal tendons ?5/2: MRI shows osteo, tenosynovitis; able to stop Levophed that night ?5/3: To OR for RIGHT BKA by Dr. Sharol Given with [facility], pressors resumed post-op ?5/4: Off pressors again ?5/5: +12.9L on admission, diuresis started ?5/6: Switched back to home oral diuretic ?5/8: ID consulted, TTE ordered, narrowed to penicillin ?5/10: TEE negative for endocarditis, ID switched ABX to amoxicillin ?  ? ?Subjective: ?-Having BMs now, no events overnight ? ?Assessment and Plan: ? ?Septic shock (HCC)-poa ?-Secondary to strep bacteremia, diabetic foot infection ?-Treated with Levophed for several days in the ICU, hemodynamics improved back to baseline. ?-Now improved, antibiotics switched to amoxicillin ?-CIR for rehab ? ?Osteomyelitis of right foot ([place]) ?S/p right BKA by Dr. Sharol Given on 5/4 ?-wound vac, pain control ? ?Streptococcal bacteremia ?Blood cultures on admission grew Streptococcus anginosus, source is likely diabetic foot infection. ID consulted, ?-Repeat blood cultures are negative ?-TEE 5/10 negative for endocarditis ?-changed to oral amoxicillin for 3 more days  (to complete 2-week course) on 5/13 ? ?Acute on chronic combined systolic and diastolic CHF (congestive heart failure) ([place]) ?Patient's baseline EF is less than 20% due to ischemic heart disease. ?- now euvolemic, Continue home torsemide, Entresto ?- Hold  Imdur, jardiance was stopped by endo in Nov 22 ?-Clinically euvolemic ? ?Hypophosphatemia ?Supplemented and resolved ? ?AKI (acute kidney injury) on CKD3a ?Creatinine 2.1 on admission, improved ?-baseline creatinine 1.5-1.8 ? ?Essential hypertension, benign ?Blood pressure soft still.  Baseline BP 63-016 systolic given low EF. ?- Endo carvedilol and Jardiance on hold ? ?Controlled type 2 diabetes mellitus with hyperglycemia, with long-term current use of insulin ([place]) ?Glucose overall controlled. ?- Continue glargine, SSI ? ?Coronary artery disease involving native coronary artery of native heart without angina pectoris ?- Continue Zetia, gemfibrozil, Crestor ?- Continue aspirin, Plavix ?- Hold Coreg, Imdur, Entresto, Jardiance ? ?PAD (peripheral artery disease) ([place]) ?- Continue Crestor, Zetia, gemfibrozil ?- Contineu aspirin, plavix ? ?Constipation ?-add senokot, dulcolax supp, increase miralax to BID ? ?Morbid obesity with BMI of 40.0-44.9, adult ([place]) ?BMI 42 ? ?Constipation ?-Continue current laxatives, starting to have BMs now ? ? ?DVT prophylaxis: Lovenox ?Code Status: Partial code ?Family Communication: ?Disposition Plan: CIR when bed available ? ?Consultants:  ? ? ?Procedures: 5/3: R BKA ?TEE 5/21, negative for endocarditis ? ? ?Objective: ?Vitals:  ? 03/19/22 0742 03/19/22 0847 03/19/22 1052 03/19/22 1211  ?BP: (!) 96/57 99/65 (!) 100/41 94/66  ?Pulse: 70     ?Resp: 18     ?Temp: 97.9 ?F (36.6 ?C)     ?TempSrc: Oral     ?SpO2: 96%     ?Weight:      ?Height:      ? ? ?Intake/Output Summary (Last 24 hours) at 03/19/2022 1322 ?Last data filed at 03/19/2022 0850 ?Gross per 24 hour  ?Intake 1204.5 ml  ?Output 1825 ml  ?Net -620.5 ml  ? ?Filed  Weights  ? 03/17/22 0209 03/18/22 0332 03/18/22 0820  ?Weight: 118.7 kg 118.6 kg 118 kg  ? ? ?Examination: ? ? ? ? ? ?Data Reviewed:  ? ?CBC: ?Recent Labs  ?Lab 03/16/22 ?0258 03/18/22 ?5277 03/19/22 ?0432  ?WBC 8.9 7.0 6.0  ?HGB 12.9* 12.9* 13.1  ?HCT 38.2* 38.5* 40.3  ?MCV  89.5 90.0 91.6  ?PLT 318 287 239  ? ?Basic Metabolic Panel: ?Recent Labs  ?Lab 03/13/22 ?8242 03/14/22 ?3536 03/15/22 ?1443 03/16/22 ?1540 03/18/22 ?0867 03/19/22 ?0432  ?NA 136 137 140 139 139 138  ?K 4.7 4.2 4.0 3.8 3.9 3.9  ?CL 106 105 106 105 103 105  ?CO2 '23 23 25 25 28 27  '$ ?GLUCOSE 154* 129* 158* 120* 212* 141*  ?BUN 48* 45* 38* 34* 33* 33*  ?CREATININE 1.82* 1.72* 1.66* 1.61* 1.57* 1.51*  ?CALCIUM 8.1* 8.0* 8.3* 8.2* 8.3* 8.1*  ?PHOS 2.8  --   --   --   --   --   ? ?GFR: ?Estimated Creatinine Clearance: 61 mL/min (A) (by C-G formula based on SCr of 1.51 mg/dL (H)). ?Liver Function Tests: ?No results for input(s): AST, ALT, ALKPHOS, BILITOT, PROT, ALBUMIN in the last 168 hours. ?No results for input(s): LIPASE, AMYLASE in the last 168 hours. ?No results for input(s): AMMONIA in the last 168 hours. ?Coagulation Profile: ?No results for input(s): INR, PROTIME in the last 168 hours. ?Cardiac Enzymes: ?No results for input(s): CKTOTAL, CKMB, CKMBINDEX, TROPONINI in the last 168 hours. ?BNP (last 3 results) ?No results for input(s): PROBNP in the last 8760 hours. ?HbA1C: ?No results for input(s): HGBA1C in the last 72 hours. ?CBG: ?Recent Labs  ?Lab 03/18/22 ?1113 03/18/22 ?1524 03/18/22 ?2146 03/19/22 ?6195 03/19/22 ?1147  ?GLUCAP 170* 148* 177* 147* 187*  ? ?Lipid Profile: ?No results for input(s): CHOL, HDL, LDLCALC, TRIG, CHOLHDL, LDLDIRECT in the last 72 hours. ?Thyroid Function Tests: ?No results for input(s): TSH, T4TOTAL, FREET4, T3FREE, THYROIDAB in the last 72 hours. ?Anemia Panel: ?No results for input(s): VITAMINB12, FOLATE, FERRITIN, TIBC, IRON, RETICCTPCT in the last 72 hours. ?Urine analysis: ?   ?Component Value Date/Time  ? BILIRUBINUR negative 06/26/2021 1300  ? BILIRUBINUR NEG 10/13/2012 0925  ? KETONESUR negative 06/26/2021 1300  ? [place] SMALL 10/13/2012 0925  ? UROBILINOGEN 0.2 06/26/2021 1300  ? NITRITE Negative 06/26/2021 1300  ? NITRITE NEG 10/13/2012 0925  ? LEUKOCYTESUR Negative  06/26/2021 1300  ? ?Sepsis Labs: ?'@LABRCNTIP'$ (procalcitonin:4,lacticidven:4) ? ?) ?Recent Results (from the past 240 hour(s))  ?Surgical pcr screen     Status: None  ? Collection Time: 03/10/22  8:09 PM  ? Specimen: Nasal Mucosa; Nasal Swab  ?Result Value Ref Range Status  ? MRSA, PCR NEGATIVE NEGATIVE Final  ? Staphylococcus aureus NEGATIVE NEGATIVE Final  ?  Comment: (NOTE) ?The Xpert SA Assay (FDA approved for NASAL specimens in patients 14 ?years of age and older), is one component of a comprehensive ?surveillance program. It is not intended to diagnose infection nor to ?guide or monitor treatment. ?Performed at [facility], [place] [address], [place], [place] ?[address] ?  ?Culture, blood (Routine X 2) w Reflex to ID Panel     Status: None  ? Collection Time: 03/12/22  3:09 PM  ? Specimen: BLOOD  ?Result Value Ref Range Status  ? Specimen Description BLOOD BLOOD RIGHT ARM  Final  ? Special Requests   Final  ?  BOTTLES DRAWN AEROBIC AND ANAEROBIC Blood Culture adequate volume  ? Culture   Final  ?  NO GROWTH 5 DAYS ?Performed at [facility], [place] [address], [place], [place] [address] ?  ? Report Status 03/17/2022 FINAL  Final  ?Culture, blood (Routine X 2) w Reflex to ID Panel     Status: None  ? Collection Time: 03/12/22  3:17 PM  ? Specimen: BLOOD  ?Result Value Ref Range Status  ? Specimen Description BLOOD SITE NOT SPECIFIED  Final  ? Special Requests   Final  ?  BOTTLES DRAWN AEROBIC AND ANAEROBIC Blood Culture adequate volume  ? Culture   Final  ?  NO GROWTH 5 DAYS ?Performed at [facility], [place] [address] [place], [place] [address] ?  ? Report Status 03/17/2022 FINAL  Final  ?  ? ?Radiology Studies: ?ECHO TEE ? ?Result Date: 03/18/2022 ?   TRANSESOPHOGEAL ECHO REPORT   Patient Name:   KEYLOR RANDS Date of Exam: 03/18/2022 Medical Rec #:  [id number]    Height:       69.0 in Accession #:    [id number]   Weight:       260.1 lb Date of Birth:  12-29-1955   BSA:          2.310 m? Patient Age:     65 years     BP:           72/58 mmHg Patient Gender: M            HR:           79 bpm. Exam Location:  Inpatient Procedure: Transesophageal Echo, Cardiac Doppler and Color Doppler Indications:     Bacteremi

## 2022-03-20 ENCOUNTER — Encounter (HOSPITAL_COMMUNITY)
Admission: EM | Disposition: A | Discharge status: Rehabilitation Facility | Payer: Self-pay | Source: Home / Self Care | Attending: Family Medicine

## 2022-03-20 DIAGNOSIS — R6521 Severe sepsis with septic shock: Secondary | ICD-10-CM | POA: Diagnosis not present

## 2022-03-20 DIAGNOSIS — A419 Sepsis, unspecified organism: Secondary | ICD-10-CM | POA: Diagnosis not present

## 2022-03-20 LAB — GLUCOSE, CAPILLARY
Glucose-Capillary: 141 mg/dL — ABNORMAL HIGH (ref 70–99)
Glucose-Capillary: 243 mg/dL — ABNORMAL HIGH (ref 70–99)
Glucose-Capillary: 259 mg/dL — ABNORMAL HIGH (ref 70–99)
Glucose-Capillary: 197 mg/dL — ABNORMAL HIGH (ref 70–99)
Glucose-Capillary: 157 mg/dL — ABNORMAL HIGH (ref 70–99)

## 2022-03-20 SURGERY — ECHOCARDIOGRAM, TRANSESOPHAGEAL
Anesthesia: Monitor Anesthesia Care

## 2022-03-20 NOTE — H&P (Incomplete)
? ? ?Physical Medicine and Rehabilitation Admission H&P ? ?  ?CC: Debility secondary to right BKA ? ?HPI: Eduardo [name] is a 66 year old male who presented to the emergency department on 03/08/2022 with complaints of fever, chills malaise and painful swollen right foot wound for several days.  He was hypotensive and required Levophed.  Admitted to the ICU on broad-spectrum antibiotics.  His past medical history significant for diabetes mellitus and peripheral arterial disease.  Orthopedic surgery consulted and noted exposed bone at the base of the fifth metatarsal and cellulitis extending to the peroneal tendons.  An MRI showed osteomyelitis and tenosynovitis.  He stabilized and was taken to the operating room on 5/3 for right below the knee amputation by Dr. Sharol Given.  Diuresis started on 5/5 for volume overload secondary to IV fluid resuscitation.  He was transitioned to oral diuretics.  Infectious disease consultation obtained and he underwent TTE.  Antibiotics were narrowed to penicillin.  No evidence of endocarditis.  Blood cultures were positive for Streptococcus anginosus.  Repeat blood cultures are negative.  He will complete 2-week course of antibiotics on 5/13.The patient requires inpatient physical medicine and rehabilitation evaluations and treatment secondary to dysfunction due to right below the knee amputation. ? ?Patient's medical history is also significant for acute on chronic combined systolic and diastolic congestive heart failure.  His ejection fraction is estimated at 20 to 25%.  He will continue on home torsemide and Entresto. ?History of bilateral Dupuytren hand contractions. ? ?Review of Systems  ?Constitutional:  Negative for chills and fever.  ?HENT:  Negative for hearing loss and sore throat.   ?Eyes:  Negative for blurred vision and double vision.  ?Respiratory:  Negative for cough and sputum production.   ?Cardiovascular:  Negative for chest pain and palpitations.  ?Gastrointestinal:  Positive  for abdominal pain and constipation. Negative for nausea and vomiting.  ?Genitourinary:  Negative for dysuria and urgency.  ?Musculoskeletal:  Negative for back pain and neck pain.  ?Skin:  Negative for itching and rash.  ?Neurological:  Negative for dizziness and headaches.  ?Psychiatric/Behavioral:  Negative for depression. The patient does not have insomnia.   ?Past Medical History:  ?Diagnosis Date  ? CHF (congestive heart failure) ([place])   ? Chronic kidney disease   ? stage 3  ? Colon polyp   ? Coronary artery disease   ? Diabetes mellitus 1987  ? under care of Dr. Chalmers Cater.  On insulin since 96 (off and on)  ? Diabetic retinopathy   ? Dupuytren contracture   ? R hand, s/p injection (Dr. Lenon Curt)  ? Essential hypertension, benign   ? Essential hypertension, benign 02/06/2019  ? Frequency of urination and polyuria   ? Hypertension   ? Myocardial infarction [facility])   ? denies  ? Neuromuscular disorder ([place])   ? Diabetic neuropathy  ? Osteomyelitis ([place])   ? right foot  ? Other testicular hypofunction   ? Peripheral arterial disease ([place]) 10/28/2012  ? Peritoneal abscess ([place]) 6/08  ? and buttock.  ? Pneumonia   ? Polydipsia   ? Proteinuria   ? Pure hyperglyceridemia   ? Subacute osteomyelitis, right ankle and foot ([place])   ? Wears glasses   ? ?Past Surgical History:  ?Procedure Laterality Date  ? ABDOMINAL AORTAGRAM N/A 04/18/2012  ? Procedure: ABDOMINAL AORTAGRAM;  Surgeon: Angelia Mould, MD;  Location: [facility] CATH LAB;  Service: Cardiovascular;  Laterality: N/A;  ? AMPUTATION Right 05/19/2019  ? Procedure: RIGHT FOOT 5TH RAY AMPUTATION;  Surgeon:  Newt Minion, MD;  Location: [facility];  Service: Orthopedics;  Laterality: Right;  ? AMPUTATION Right 03/11/2022  ? Procedure: RIGHT LEG DEBRIDEMENT VS. BELOW KNEE AMPUTATION;  Surgeon: Newt Minion, MD;  Location: [place];  Service: Orthopedics;  Laterality: Right;  ? CARDIAC CATHETERIZATION N/A 09/08/2016  ? Procedure: Left Heart Cath and Coronary Angiography;  Surgeon: Adrian Prows, MD;  Location: [place] CV LAB;  Service: Cardiovascular;  Laterality: N/A;  ? CATARACT EXTRACTION, BILATERAL  09/2017, 10/2017  ? Dr. Herbert Deaner  ? COLONOSCOPY W/ BIOPSIES AND POLYPECTOMY    ? CORONARY/GRAFT ACUTE MI REVASCULARIZATION N/A 10/11/2020  ? Procedure: Coronary/Graft Acute MI Revascularization;  Surgeon: Adrian Prows, MD;  Location: [place] CV LAB;  Service: Cardiovascular;  Laterality: N/A;  ? I & D EXTREMITY Right 03/11/2022  ? Procedure: BELOW KNEE AMPUTATION;  Surgeon: Newt Minion, MD;  Location: [place];  Service: Orthopedics;  Laterality: Right;  ? LEFT HEART CATH N/A 12/31/2020  ? Procedure: Left Heart Cath;  Surgeon: Adrian Prows, MD;  Location: [place] CV LAB;  Service: Cardiovascular;  Laterality: N/A;  ? LEFT HEART CATH AND CORONARY ANGIOGRAPHY N/A 10/11/2020  ? Procedure: LEFT HEART CATH AND CORONARY ANGIOGRAPHY;  Surgeon: Adrian Prows, MD;  Location: [place] CV LAB;  Service: Cardiovascular;  Laterality: N/A;  ? LOWER EXTREMITY ANGIOGRAM Bilateral 04/18/2012  ? Procedure: LOWER EXTREMITY ANGIOGRAM;  Surgeon: Angelia Mould, MD;  Location: [facility] CATH LAB;  Service: Cardiovascular;  Laterality: Bilateral;  bilat lower extrem angio  ? LOWER EXTREMITY ANGIOGRAPHY Bilateral 05/02/2019  ? Procedure: LOWER EXTREMITY ANGIOGRAPHY;  Surgeon: Adrian Prows, MD;  Location: [place] CV LAB;  Service: Cardiovascular;  Laterality: Bilateral;  ? LOWER EXTREMITY ANGIOGRAPHY Bilateral 02/28/2019  ? Procedure: LOWER EXTREMITY ANGIOGRAPHY;  Surgeon: Adrian Prows, MD;  Location: [place] CV LAB;  Service: Cardiovascular;  Laterality: Bilateral;  ? macular photocoagulation    ? (eye treatments for diabetic retinopathy)-Dr. Zigmund Daniel  ? PERIPHERAL VASCULAR INTERVENTION  02/28/2019  ? Procedure: PERIPHERAL VASCULAR INTERVENTION;  Surgeon: Adrian Prows, MD;  Location: [place] CV LAB;  Service: Cardiovascular;;  ? TEE WITHOUT CARDIOVERSION N/A 03/18/2022  ? Procedure: TRANSESOPHAGEAL ECHOCARDIOGRAM (TEE);   Surgeon: Adrian Prows, MD;  Location: [place];  Service: Cardiovascular;  Laterality: N/A;  ? VENTRICULAR ASSIST DEVICE INSERTION N/A 12/31/2020  ? Procedure: VENTRICULAR ASSIST DEVICE INSERTION;  Surgeon: Adrian Prows, MD;  Location: [place] CV LAB;  Service: Cardiovascular;  Laterality: N/A;  ? ?Family History  ?Problem Relation Age of Onset  ? Diabetes Mother   ? Hearing loss Mother   ? Hypertension Mother   ? Hyperlipidemia Mother   ? Heart disease Mother   ? Varicose Veins Mother   ? Varicose Veins Father   ? Dementia Father   ? Hyperlipidemia Brother   ? Diabetes Maternal Grandmother   ? ?Social History:  reports that he quit smoking about 10 years ago. His smoking use included cigarettes. He has a 30.00 pack-year smoking history. He has never used smokeless tobacco. He reports that he does not currently use alcohol. He reports that he does not use drugs. ?Allergies:  ?Allergies  ?Allergen Reactions  ? Shellfish-Derived Products Nausea And Vomiting and Other (See Comments)  ?  Only Mussels cause severe nausea and vomiting ?  ? Latex Rash  ? Tape Rash and Other (See Comments)  ?  Caused issues with the skin  ? Testosterone Rash  ?  Other reaction(s): did not feel well ?Other  reaction(s): did not feel well  ? ?Medications Prior to Admission  ?Medication Sig Dispense Refill  ? acetaminophen (TYLENOL) 325 MG tablet Take 650 mg by mouth every 6 (six) hours as needed for mild pain (or headaches).    ? aspirin 81 MG chewable tablet Chew 1 tablet (81 mg total) by mouth daily.    ? Bioflavonoid Products (ESTER C PO) Take 1,000 mg by mouth daily.    ? carvedilol (COREG) 3.125 MG tablet TAKE 1 TABLET BY MOUTH  TWICE DAILY WITH MEALS (Patient taking differently: Take 3.125 mg by mouth 2 (two) times daily with a meal.) 180 tablet 3  ? Cholecalciferol (VITAMIN D) 2000 units CAPS Take 2,000 Units by mouth daily.    ? ENTRESTO 24-26 MG TAKE 1 TABLET BY MOUTH  TWICE DAILY (Patient taking differently: Take 1 tablet by mouth  2 (two) times daily.) 180 tablet 1  ? ezetimibe (ZETIA) 10 MG tablet Take 1 tablet (10 mg total) by mouth daily. 90 tablet 3  ? gemfibrozil (LOPID) 600 MG tablet Take 600 mg by mouth 2 (two) times daily

## 2022-03-20 NOTE — Progress Notes (Signed)
Mobility Specialist Progress Note: ? ? 03/20/22 1650  ?Mobility  ?Activity Transferred from chair to bed  ?Level of Assistance Standby assist, set-up cues, supervision of patient - no hands on  ?Assistive Device Stedy  ?Activity Response Tolerated well  ?$Mobility charge 1 Mobility  ? ?Pt requesting to transfer back to bed from chair. Required minG for safety to stand in stedy. Pt tolerated transfer well. Back in bed with all needs met.  ? ?Nelta Numbers ?Acute Rehab ?Secure Chat or ?Office Phone: [phone] ? ?

## 2022-03-20 NOTE — TOC Progression Note (Signed)
Transition of Care (TOC) - Progression Note  ? ? ?Patient Details  ?Name: Eduardo [name] ?MRN: [id number] ?Date of Birth: 1956-05-14 ? ?Transition of Care (TOC) CM/SW Contact  ?Zenon Mayo, RN ?Phone Number: ?03/20/2022, 10:16 AM ? ?Clinical Narrative:    ?Per Caitlen with CIR they will have a bed for patient tomorrow on Saturday.  ? ? ?  ?  ? ?Expected Discharge Plan and Services ?  ?  ?  ?  ?  ?                ?  ?  ?  ?  ?  ?  ?  ?  ?  ?  ? ? ?Social Determinants of Health (SDOH) Interventions ?  ? ?Readmission Risk Interventions ?   ? View : No data to display.  ?  ?  ?  ? ? ?

## 2022-03-20 NOTE — PMR Pre-admission (Addendum)
PMR Admission Coordinator Pre-Admission Assessment ? ?Patient: Eduardo [name] is an 66 y.o., male ?MRN: [id number] ?DOB: [birth year] ?Height: '5\' 9"'$  (175.3 cm) ?Weight: 117.4 kg ? ?Insurance Information ?HMO:     PPO: yes     PCP:      IPA:      80/20:      OTHER:  ?PRIMARY: [employer] Medicare      Policy#: [id number]      Subscriber: pt ?CM Name: Linna Hoff      Phone#: [phone]     Fax#: [phone] ?Pre-Cert#: [id number] auth for CIR from Dan with [facility] Medicare expedited appeals dept with updates due to fax listed above on 5/18      Employer:  ?Benefits:  Phone #: [phone]     Name:  ?Eff. Date: 12/10/21     Deduct: $0      Out of Pocket Max: $3900 ($73.80)      Life Max: n/a ?CIR: $295/day for days 1-5      SNF: 20 full days ?Outpatient:      Co-Pay: $20/visit ?Home Health: 100%      Co-Pay:  ?DME: 80%     Co-Ins: 20% ?Providers:  ?SECONDARY:       Policy#:      Phone#:  ? ?Financial Counselor:       Phone#:  ? ?The ?Data Collection Information Summary? for patients in Inpatient Rehabilitation Facilities with attached ?Privacy Act [place] Records? was provided and verbally reviewed with: Patient ? ?Emergency Contact Information ?Contact Information   ? ? Name Relation Home Work Mobile  ? McDermott,Stacey Friend   [phone]  ? Robenoit,Ruth Sister   [phone]  ? ?  ? ? ?Current Medical History  ?Patient Admitting Diagnosis: BKA ? ?History of Present Illness: Pt is a 66 y/o male with PMH of CAD s/p CABG x3 in 2017, HTN, HLD, DM with partial R foot amputation per Dr. Sharol Given, PVD with femoral artery stenting in 2020 and on Plavix.  He admitted to [facility] on 03/08/22 with c/o dyspnea, and intermittent fevers/weakness.  He was found to be in septic shock due to R foot osteomyelitis, started on IV antibiotics and requiring levophed and ICU admission.  Dr. Sharol Given consulted and pt presented with exposed necrotic bone, necrotic ulcer over the base of the 5th metatarsal and cellulitis extending up the peritoneal  tendons.  He underwent a BKA with application of prevena wound VAC per Dr. Sharol Given on 5/3.  Post op course complicated by hypotension, pulmonary edema, and ABLA.  Pt weaned from pressor support on 5/4, required IV lasix for fluid management (now resolved).  Therapy ongoing and pt was recommended for CIR.  ?  ? ?Patient's medical record from [facility] has been reviewed by the rehabilitation admission coordinator and physician. ? ?Past Medical History  ?Past Medical History:  ?Diagnosis Date  ? CHF (congestive heart failure) ([place])   ? Chronic kidney disease   ? stage 3  ? Colon polyp   ? Coronary artery disease   ? Diabetes mellitus 1987  ? under care of Dr. Chalmers Cater.  On insulin since 96 (off and on)  ? Diabetic retinopathy   ? Dupuytren contracture   ? R hand, s/p injection (Dr. Lenon Curt)  ? Essential hypertension, benign   ? Essential hypertension, benign 02/06/2019  ? Frequency of urination and polyuria   ? Hypertension   ? Myocardial infarction [facility])   ? denies  ? Neuromuscular disorder ([place])   ? Diabetic neuropathy  ?  Osteomyelitis ([place])   ? right foot  ? Other testicular hypofunction   ? Peripheral arterial disease ([place]) 10/28/2012  ? Peritoneal abscess ([place]) 6/08  ? and buttock.  ? Pneumonia   ? Polydipsia   ? Proteinuria   ? Pure hyperglyceridemia   ? Subacute osteomyelitis, right ankle and foot ([place])   ? Wears glasses   ? ? ?Has the patient had major surgery during 100 days prior to admission? Yes ? ?Family History   ?family history includes Dementia in his father; Diabetes in his maternal grandmother and mother; Hearing loss in his mother; Heart disease in his mother; Hyperlipidemia in his brother and mother; Hypertension in his mother; Varicose Veins in his father and mother. ? ?Current Medications ? ?Current Facility-Administered Medications:  ?  0.9 %  sodium chloride infusion, , Intravenous, PRN, Adrian Prows, MD, Last Rate: 10 mL/hr at 03/15/22 1154, New Bag at 03/15/22 1154 ?  acetaminophen (TYLENOL) tablet 650  mg, 650 mg, Oral, Q8H, Adrian Prows, MD, 650 mg at 03/20/22 0645 ?  acidophilus (RISAQUAD) capsule 1 capsule, 1 capsule, Oral, Daily, Adrian Prows, MD, 1 capsule at 03/20/22 6599 ?  alum & mag hydroxide-simeth (MAALOX/MYLANTA) 200-200-20 MG/5ML suspension 15-30 mL, 15-30 mL, Oral, Q2H PRN, Adrian Prows, MD ?  amoxicillin (AMOXIL) capsule 1,000 mg, 1,000 mg, Oral, Q8H, Manandhar, Sabina, MD, 1,000 mg at 03/20/22 0645 ?  ascorbic acid (VITAMIN C) tablet 1,000 mg, 1,000 mg, Oral, Daily, Adrian Prows, MD, 1,000 mg at 03/20/22 3570 ?  aspirin EC tablet 81 mg, 81 mg, Oral, Daily, Adrian Prows, MD, 81 mg at 03/20/22 1779 ?  bisacodyl (DULCOLAX) suppository 10 mg, 10 mg, Rectal, Once, Domenic Polite, MD ?  calcium carbonate (TUMS - dosed in mg elemental calcium) chewable tablet 200 mg of elemental calcium, 1 tablet, Oral, TID PRN, Adrian Prows, MD, 200 mg of elemental calcium at 03/16/22 0803 ?  cholecalciferol (VITAMIN D3) tablet 2,000 Units, 2,000 Units, Oral, Daily, Adrian Prows, MD, 2,000 Units at 03/20/22 3903 ?  clopidogrel (PLAVIX) tablet 75 mg, 75 mg, Oral, Daily, Adrian Prows, MD, 75 mg at 03/20/22 0929 ?  dextrose 50 % solution 0-50 mL, 0-50 mL, Intravenous, PRN, Adrian Prows, MD ?  enoxaparin (LOVENOX) injection 60 mg, 60 mg, Subcutaneous, Q24H, Adrian Prows, MD, 60 mg at 03/19/22 2153 ?  ezetimibe (ZETIA) tablet 10 mg, 10 mg, Oral, Daily, Adrian Prows, MD, 10 mg at 03/20/22 0092 ?  gemfibrozil (LOPID) tablet 600 mg, 600 mg, Oral, BID AC, Adrian Prows, MD, 600 mg at 03/20/22 3300 ?  guaiFENesin-dextromethorphan (ROBITUSSIN DM) 100-10 MG/5ML syrup 15 mL, 15 mL, Oral, Q4H PRN, Adrian Prows, MD ?  hydrALAZINE (APRESOLINE) injection 5 mg, 5 mg, Intravenous, Q20 Min PRN, Adrian Prows, MD ?  HYDROmorphone (DILAUDID) injection 0.5-1 mg, 0.5-1 mg, Intravenous, Q4H PRN, Adrian Prows, MD ?  insulin aspart (novoLOG) injection 0-20 Units, 0-20 Units, Subcutaneous, TID WC, Adrian Prows, MD, 3 Units at 03/20/22 0645 ?  insulin aspart (novoLOG) injection 0-5  Units, 0-5 Units, Subcutaneous, QHS, Adrian Prows, MD, 3 Units at 03/17/22 2112 ?  insulin aspart (novoLOG) injection 3 Units, 3 Units, Subcutaneous, TID WC, Adrian Prows, MD, 3 Units at 03/20/22 0800 ?  insulin glargine-yfgn (SEMGLEE) injection 40 Units, 40 Units, Subcutaneous, Daily, Adrian Prows, MD, 40 Units at 03/20/22 7622 ?  labetalol (NORMODYNE) injection 10 mg, 10 mg, Intravenous, Q10 min PRN, Adrian Prows, MD ?  magnesium sulfate IVPB 2 g 50 mL, 2 g, Intravenous, Daily PRN, Adrian Prows,  MD ?  MEDLINE mouth rinse, 15 mL, Mouth Rinse, BID, Adrian Prows, MD, 15 mL at 03/20/22 0930 ?  metoprolol tartrate (LOPRESSOR) injection 2-5 mg, 2-5 mg, Intravenous, Q2H PRN, Adrian Prows, MD ?  multivitamin with minerals tablet 1 tablet, 1 tablet, Oral, Daily, Adrian Prows, MD, 1 tablet at 03/20/22 1443 ?  nutrition supplement (JUVEN) (JUVEN) powder packet 1 packet, 1 packet, Oral, BID BM, Adrian Prows, MD, 1 packet at 03/20/22 0930 ?  ondansetron (ZOFRAN) injection 4 mg, 4 mg, Intravenous, Q6H PRN, Adrian Prows, MD ?  ondansetron [facility]) injection 4 mg, 4 mg, Intravenous, Q6H PRN, Adrian Prows, MD ?  oxyCODONE (Oxy IR/ROXICODONE) immediate release tablet 5-10 mg, 5-10 mg, Oral, Q4H PRN, Adrian Prows, MD, 10 mg at 03/20/22 0943 ?  pantoprazole (PROTONIX) EC tablet 40 mg, 40 mg, Oral, Daily, Adrian Prows, MD, 40 mg at 03/20/22 0930 ?  polyethylene glycol (MIRALAX / GLYCOLAX) packet 17 g, 17 g, Oral, BID, Domenic Polite, MD, 17 g at 03/20/22 0930 ?  potassium chloride SA (KLOR-CON M) CR tablet 20-40 mEq, 20-40 mEq, Oral, Daily PRN, Adrian Prows, MD ?  rosuvastatin (CRESTOR) tablet 20 mg, 20 mg, Oral, Daily, Adrian Prows, MD, 20 mg at 03/20/22 0930 ?  sacubitril-valsartan (ENTRESTO) 24-26 mg per tablet, 1 tablet, Oral, BID, Adrian Prows, MD, 1 tablet at 03/20/22 0930 ?  senna-docusate (Senokot-S) tablet 1 tablet, 1 tablet, Oral, BID, Domenic Polite, MD, 1 tablet at 03/20/22 1540 ?  senna-docusate (Senokot-S) tablet 2 tablet, 2 tablet, Oral, BID PRN,  Adrian Prows, MD, 2 tablet at 03/17/22 0908 ?  torsemide (DEMADEX) tablet 20 mg, 20 mg, Oral, BID, Adrian Prows, MD, 20 mg at 03/20/22 0930 ?  traMADol (ULTRAM) tablet 50 mg, 50 mg, Oral, Q6H PRN, Adrian Prows, M

## 2022-03-20 NOTE — Progress Notes (Signed)
?PROGRESS NOTE ? ? ? ?PACE LAMADRID  KDT:[id number] DOB: 03-29-1956 DOA: 03/08/2022 ?PCP: Rita Ohara, MD  ? ?[name] is a 66 y.o. M with CAD s/p CABG 2017, PVD s/p R SFA stent 2020, DM, chronic heart failure EF 15-20%, chronic kidney disease and prior right partial foot amputation who presented with few days fever, chills, malaise and painful swollen right foot wound.  In the ED he was noted to be in shock, started on peripheral Levophed and admitted to the ICU on vancomycin and Cefepime. ?-4/30: Admitted to ICU ?5/1: Ortho consulted, noted exposed bone at base of 5th metatarsal and cellulitis up peroneal tendons ?5/2: MRI shows osteo, tenosynovitis; able to stop Levophed that night ?5/3: To OR for RIGHT BKA by Dr. Sharol Given with [facility], pressors resumed post-op ?5/4: Off pressors again ?5/5: +12.9L on admission, diuresis started ?5/6: Switched back to home oral diuretic ?5/8: ID consulted, TTE ordered, narrowed to penicillin ?5/10: TEE negative for endocarditis, ID switched ABX to amoxicillin ?  ? ?Subjective: ?-Feels okay, no events overnight, saw Dr. Sharol Given this morning, plan for rehab tomorrow ? ?Assessment and Plan: ? ?Septic shock (HCC)-poa ?-Secondary to strep bacteremia, diabetic foot infection ?-Treated with Levophed for several days in the ICU, hemodynamics improved back to baseline. ?-Now improved, antibiotics switched to amoxicillin ?-CIR for rehab when bed available ? ?Osteomyelitis of right foot ([place]) ?S/p right BKA by Dr. Sharol Given on 5/4 ?-wound vac, pain control ? ?Streptococcal bacteremia ?Blood cultures on admission grew Streptococcus anginosus, source is likely diabetic foot infection. ID consulted, ?-Repeat blood cultures are negative ?-TEE 5/10 negative for endocarditis ?-changed to oral amoxicillin for 3 more days  (to complete 2-week course) on 5/13 ? ?Acute on chronic combined systolic and diastolic CHF (congestive heart failure) ([place]) ?Patient's baseline EF is less than 20% due to ischemic heart  disease. ?- now euvolemic, Continue home torsemide, Entresto ?- Hold Imdur, jardiance was stopped by endo in Nov 22 ?-Clinically euvolemic ? ?Hypophosphatemia ?Supplemented and resolved ? ?AKI (acute kidney injury) on CKD3a ?Creatinine 2.1 on admission, improved ?-baseline creatinine 1.5-1.8 ? ?Essential hypertension, benign ?Blood pressure soft still.  Baseline BP 99-833 systolic given low EF. ?-  carvedilol on hold, resume if BP trends up ? ?Controlled type 2 diabetes mellitus with hyperglycemia, with long-term current use of insulin ([place]) ?Glucose overall controlled. ?- Continue glargine, SSI ? ?Coronary artery disease involving native coronary artery of native heart without angina pectoris ?- Continue Zetia, gemfibrozil, Crestor ?- Continue aspirin, Plavix ?- Hold Coreg, Imdur, Entresto, Jardiance ? ?PAD (peripheral artery disease) ([place]) ?- Continue Crestor, Zetia, gemfibrozil ?- Contineu aspirin, plavix ? ?Constipation ?- senokot, dulcolax supp, increase miralax to BID ? ?Morbid obesity with BMI of 40.0-44.9, adult ([place]) ?BMI 42 ? ? ?DVT prophylaxis: Lovenox ?Code Status: Partial code ?Family Communication: ?Disposition Plan: CIR tomorrow ? ?Consultants:  ? ? ?Procedures: 5/3: R BKA ?TEE 5/21, negative for endocarditis ? ? ?Objective: ?Vitals:  ? 03/20/22 0900 03/20/22 0927 03/20/22 0934 03/20/22 0951  ?BP: 98/65 98/65    ?Pulse: 70  76   ?Resp: 18     ?Temp: 98.9 ?F (37.2 ?C)   98.9 ?F (37.2 ?C)  ?TempSrc: Oral     ?SpO2: 98%  97%   ?Weight:      ?Height:      ? ? ?Intake/Output Summary (Last 24 hours) at 03/20/2022 1148 ?Last data filed at 03/20/2022 0818 ?Gross per 24 hour  ?Intake 600 ml  ?Output 2425 ml  ?Net -1825 ml  ? ?  Filed Weights  ? 03/18/22 0332 03/18/22 0820 03/20/22 0430  ?Weight: 118.6 kg 118 kg 117.4 kg  ? ? ?Examination: ? ?Gen: Awake, Alert, Oriented X 3,  ?HEENT: no JVD ?Lungs: Good air movement bilaterally, CTAB ?CVS: S1S2/RRR ?Abd: soft, Non tender, non distended, BS present ?Extremities:  Right BKA with wound VAC ?Skin: no new rashes on exposed skin  ? ? ? ?Data Reviewed:  ? ?CBC: ?Recent Labs  ?Lab 03/16/22 ?5852 03/18/22 ?7782 03/19/22 ?0432  ?WBC 8.9 7.0 6.0  ?HGB 12.9* 12.9* 13.1  ?HCT 38.2* 38.5* 40.3  ?MCV 89.5 90.0 91.6  ?PLT 318 287 239  ? ?Basic Metabolic Panel: ?Recent Labs  ?Lab 03/14/22 ?0208 03/15/22 ?0233 03/16/22 ?4235 03/18/22 ?3614 03/19/22 ?0432  ?NA 137 140 139 139 138  ?K 4.2 4.0 3.8 3.9 3.9  ?CL 105 106 105 103 105  ?CO2 '23 25 25 28 27  '$ ?GLUCOSE 129* 158* 120* 212* 141*  ?BUN 45* 38* 34* 33* 33*  ?CREATININE 1.72* 1.66* 1.61* 1.57* 1.51*  ?CALCIUM 8.0* 8.3* 8.2* 8.3* 8.1*  ? ?GFR: ?Estimated Creatinine Clearance: 60.8 mL/min (A) (by C-G formula based on SCr of 1.51 mg/dL (H)). ?Liver Function Tests: ?No results for input(s): AST, ALT, ALKPHOS, BILITOT, PROT, ALBUMIN in the last 168 hours. ?No results for input(s): LIPASE, AMYLASE in the last 168 hours. ?No results for input(s): AMMONIA in the last 168 hours. ?Coagulation Profile: ?No results for input(s): INR, PROTIME in the last 168 hours. ?Cardiac Enzymes: ?No results for input(s): CKTOTAL, CKMB, CKMBINDEX, TROPONINI in the last 168 hours. ?BNP (last 3 results) ?No results for input(s): PROBNP in the last 8760 hours. ?HbA1C: ?No results for input(s): HGBA1C in the last 72 hours. ?CBG: ?Recent Labs  ?Lab 03/19/22 ?1147 03/19/22 ?1623 03/19/22 ?2055 03/20/22 ?4315 03/20/22 ?1129  ?GLUCAP 187* 199* 92 141* 243*  ? ?Lipid Profile: ?No results for input(s): CHOL, HDL, LDLCALC, TRIG, CHOLHDL, LDLDIRECT in the last 72 hours. ?Thyroid Function Tests: ?No results for input(s): TSH, T4TOTAL, FREET4, T3FREE, THYROIDAB in the last 72 hours. ?Anemia Panel: ?No results for input(s): VITAMINB12, FOLATE, FERRITIN, TIBC, IRON, RETICCTPCT in the last 72 hours. ?Urine analysis: ?   ?Component Value Date/Time  ? BILIRUBINUR negative 06/26/2021 1300  ? BILIRUBINUR NEG 10/13/2012 0925  ? KETONESUR negative 06/26/2021 1300  ? [place] SMALL  10/13/2012 0925  ? UROBILINOGEN 0.2 06/26/2021 1300  ? NITRITE Negative 06/26/2021 1300  ? NITRITE NEG 10/13/2012 0925  ? LEUKOCYTESUR Negative 06/26/2021 1300  ? ?Sepsis Labs: ?'@LABRCNTIP'$ (procalcitonin:4,lacticidven:4) ? ?) ?Recent Results (from the past 240 hour(s))  ?Surgical pcr screen     Status: None  ? Collection Time: 03/10/22  8:09 PM  ? Specimen: Nasal Mucosa; Nasal Swab  ?Result Value Ref Range Status  ? MRSA, PCR NEGATIVE NEGATIVE Final  ? Staphylococcus aureus NEGATIVE NEGATIVE Final  ?  Comment: (NOTE) ?The Xpert SA Assay (FDA approved for NASAL specimens in patients 47 ?years of age and older), is one component of a comprehensive ?surveillance program. It is not intended to diagnose infection nor to ?guide or monitor treatment. ?Performed at [facility], [place] [address] [place], [place] ?[address] ?  ?Culture, blood (Routine X 2) w Reflex to ID Panel     Status: None  ? Collection Time: 03/12/22  3:09 PM  ? Specimen: BLOOD  ?Result Value Ref Range Status  ? Specimen Description BLOOD BLOOD RIGHT ARM  Final  ? Special Requests   Final  ?  BOTTLES DRAWN AEROBIC AND  ANAEROBIC Blood Culture adequate volume  ? Culture   Final  ?  NO GROWTH 5 DAYS ?Performed at [facility], [place] [address], [place], [place] [address] ?  ? Report Status 03/17/2022 FINAL  Final  ?Culture, blood (Routine X 2) w Reflex to ID Panel     Status: None  ? Collection Time: 03/12/22  3:17 PM  ? Specimen: BLOOD  ?Result Value Ref Range Status  ? Specimen Description BLOOD SITE NOT SPECIFIED  Final  ? Special Requests   Final  ?  BOTTLES DRAWN AEROBIC AND ANAEROBIC Blood Culture adequate volume  ? Culture   Final  ?  NO GROWTH 5 DAYS ?Performed at [facility], [place] [address] [place], [place] [address] ?  ? Report Status 03/17/2022 FINAL  Final  ?  ? ?Radiology Studies: ?No results found. ? ? ?Scheduled Meds: ? acetaminophen  650 mg Oral Q8H  ? acidophilus  1 capsule Oral Daily  ? amoxicillin  1,000 mg Oral Q8H  ?  vitamin C  1,000 mg Oral Daily  ? aspirin EC  81 mg Oral Daily  ? bisacodyl  10 mg Rectal Once  ? cholecalciferol  2,000 Units Oral Daily  ? clopidogrel  75 mg Oral Daily  ? enoxaparin (LOVENOX) injection  60 mg Subcuta

## 2022-03-20 NOTE — Progress Notes (Signed)
Inpatient Rehab Admissions Coordinator:  ? ?I have a bed for this pt to admit to CIR on Saturday (5/13).  Dr. Broadus John in agreement.  Rehab MD (Dr. Curlene Dolphin) to assess pt and confirm admission on Saturday.  Floor RN can call CIR at [phone] for report after 12pm on Saturday.  I will let pt/family and case manager know.   ? ?Shann Medal, PT, DPT ?Admissions Coordinator ?[phone] ?03/20/22  ?10:12 AM  ? ?

## 2022-03-21 ENCOUNTER — Encounter (HOSPITAL_COMMUNITY): Payer: Self-pay | Admitting: Physical Medicine and Rehabilitation

## 2022-03-21 ENCOUNTER — Other Ambulatory Visit: Payer: Self-pay

## 2022-03-21 ENCOUNTER — Inpatient Hospital Stay (HOSPITAL_COMMUNITY)
Admission: RE | Admit: 2022-03-21 | Discharge: 2022-03-31 | DRG: 559 | Disposition: A | Discharge status: Home Health | Payer: Medicare Other | Source: Intra-hospital | Attending: Physical Medicine and Rehabilitation | Admitting: Physical Medicine and Rehabilitation

## 2022-03-21 DIAGNOSIS — I5022 Chronic systolic (congestive) heart failure: Secondary | ICD-10-CM | POA: Diagnosis not present

## 2022-03-21 DIAGNOSIS — Z89511 Acquired absence of right leg below knee: Secondary | ICD-10-CM | POA: Diagnosis not present

## 2022-03-21 DIAGNOSIS — I2510 Atherosclerotic heart disease of native coronary artery without angina pectoris: Secondary | ICD-10-CM | POA: Diagnosis present

## 2022-03-21 DIAGNOSIS — Z713 Dietary counseling and surveillance: Secondary | ICD-10-CM

## 2022-03-21 DIAGNOSIS — Z833 Family history of diabetes mellitus: Secondary | ICD-10-CM

## 2022-03-21 DIAGNOSIS — I255 Ischemic cardiomyopathy: Secondary | ICD-10-CM | POA: Diagnosis not present

## 2022-03-21 DIAGNOSIS — Z7984 Long term (current) use of oral hypoglycemic drugs: Secondary | ICD-10-CM

## 2022-03-21 DIAGNOSIS — Z91040 Latex allergy status: Secondary | ICD-10-CM | POA: Diagnosis not present

## 2022-03-21 DIAGNOSIS — G4700 Insomnia, unspecified: Secondary | ICD-10-CM | POA: Diagnosis not present

## 2022-03-21 DIAGNOSIS — R5381 Other malaise: Secondary | ICD-10-CM | POA: Diagnosis not present

## 2022-03-21 DIAGNOSIS — I252 Old myocardial infarction: Secondary | ICD-10-CM | POA: Diagnosis not present

## 2022-03-21 DIAGNOSIS — I130 Hypertensive heart and chronic kidney disease with heart failure and stage 1 through stage 4 chronic kidney disease, or unspecified chronic kidney disease: Secondary | ICD-10-CM | POA: Diagnosis not present

## 2022-03-21 DIAGNOSIS — E1169 Type 2 diabetes mellitus with other specified complication: Secondary | ICD-10-CM | POA: Diagnosis not present

## 2022-03-21 DIAGNOSIS — Z794 Long term (current) use of insulin: Secondary | ICD-10-CM

## 2022-03-21 DIAGNOSIS — I25810 Atherosclerosis of coronary artery bypass graft(s) without angina pectoris: Secondary | ICD-10-CM | POA: Diagnosis not present

## 2022-03-21 DIAGNOSIS — M720 Palmar fascial fibromatosis [Dupuytren]: Secondary | ICD-10-CM | POA: Diagnosis present

## 2022-03-21 DIAGNOSIS — Z91013 Allergy to seafood: Secondary | ICD-10-CM

## 2022-03-21 DIAGNOSIS — E1122 Type 2 diabetes mellitus with diabetic chronic kidney disease: Secondary | ICD-10-CM | POA: Diagnosis present

## 2022-03-21 DIAGNOSIS — E1151 Type 2 diabetes mellitus with diabetic peripheral angiopathy without gangrene: Secondary | ICD-10-CM | POA: Diagnosis present

## 2022-03-21 DIAGNOSIS — I5043 Acute on chronic combined systolic (congestive) and diastolic (congestive) heart failure: Secondary | ICD-10-CM | POA: Diagnosis not present

## 2022-03-21 DIAGNOSIS — Z4781 Encounter for orthopedic aftercare following surgical amputation: Secondary | ICD-10-CM | POA: Diagnosis not present

## 2022-03-21 DIAGNOSIS — N1832 Chronic kidney disease, stage 3b: Secondary | ICD-10-CM | POA: Diagnosis not present

## 2022-03-21 DIAGNOSIS — Z888 Allergy status to other drugs, medicaments and biological substances status: Secondary | ICD-10-CM

## 2022-03-21 DIAGNOSIS — E11319 Type 2 diabetes mellitus with unspecified diabetic retinopathy without macular edema: Secondary | ICD-10-CM | POA: Diagnosis present

## 2022-03-21 DIAGNOSIS — Z91048 Other nonmedicinal substance allergy status: Secondary | ICD-10-CM

## 2022-03-21 DIAGNOSIS — R7881 Bacteremia: Secondary | ICD-10-CM | POA: Diagnosis present

## 2022-03-21 DIAGNOSIS — A419 Sepsis, unspecified organism: Secondary | ICD-10-CM | POA: Diagnosis not present

## 2022-03-21 DIAGNOSIS — K5900 Constipation, unspecified: Secondary | ICD-10-CM | POA: Diagnosis not present

## 2022-03-21 DIAGNOSIS — I10 Essential (primary) hypertension: Secondary | ICD-10-CM | POA: Diagnosis not present

## 2022-03-21 DIAGNOSIS — D62 Acute posthemorrhagic anemia: Secondary | ICD-10-CM | POA: Diagnosis present

## 2022-03-21 DIAGNOSIS — E1140 Type 2 diabetes mellitus with diabetic neuropathy, unspecified: Secondary | ICD-10-CM | POA: Diagnosis not present

## 2022-03-21 DIAGNOSIS — E785 Hyperlipidemia, unspecified: Secondary | ICD-10-CM | POA: Diagnosis present

## 2022-03-21 DIAGNOSIS — Z7982 Long term (current) use of aspirin: Secondary | ICD-10-CM

## 2022-03-21 DIAGNOSIS — Z79899 Other long term (current) drug therapy: Secondary | ICD-10-CM

## 2022-03-21 DIAGNOSIS — Z8249 Family history of ischemic heart disease and other diseases of the circulatory system: Secondary | ICD-10-CM

## 2022-03-21 DIAGNOSIS — B955 Unspecified streptococcus as the cause of diseases classified elsewhere: Secondary | ICD-10-CM | POA: Diagnosis present

## 2022-03-21 DIAGNOSIS — M79604 Pain in right leg: Secondary | ICD-10-CM | POA: Diagnosis not present

## 2022-03-21 DIAGNOSIS — T8743 Infection of amputation stump, right lower extremity: Secondary | ICD-10-CM | POA: Diagnosis not present

## 2022-03-21 DIAGNOSIS — Z87891 Personal history of nicotine dependence: Secondary | ICD-10-CM | POA: Diagnosis not present

## 2022-03-21 DIAGNOSIS — Z6837 Body mass index (BMI) 37.0-37.9, adult: Secondary | ICD-10-CM

## 2022-03-21 DIAGNOSIS — N179 Acute kidney failure, unspecified: Secondary | ICD-10-CM | POA: Diagnosis present

## 2022-03-21 DIAGNOSIS — E119 Type 2 diabetes mellitus without complications: Secondary | ICD-10-CM

## 2022-03-21 DIAGNOSIS — R6521 Severe sepsis with septic shock: Secondary | ICD-10-CM | POA: Diagnosis not present

## 2022-03-21 LAB — GLUCOSE, CAPILLARY
Glucose-Capillary: 183 mg/dL — ABNORMAL HIGH (ref 70–99)
Glucose-Capillary: 217 mg/dL — ABNORMAL HIGH (ref 70–99)
Glucose-Capillary: 154 mg/dL — ABNORMAL HIGH (ref 70–99)

## 2022-03-21 LAB — BASIC METABOLIC PANEL
Anion gap: 7 (ref 5–15)
BUN: 31 mg/dL — ABNORMAL HIGH (ref 8–23)
CO2: 26 mmol/L (ref 22–32)
Calcium: 8.2 mg/dL — ABNORMAL LOW (ref 8.9–10.3)
Chloride: 102 mmol/L (ref 98–111)
Creatinine, Ser: 1.42 mg/dL — ABNORMAL HIGH (ref 0.61–1.24)
GFR, Estimated: 54 mL/min — ABNORMAL LOW (ref >60)
Glucose, Bld: 181 mg/dL — ABNORMAL HIGH (ref 70–99)
Potassium: 3.9 mmol/L (ref 3.5–5.1)
Sodium: 135 mmol/L (ref 135–145)

## 2022-03-21 LAB — CBC
HCT: 37.1 % — ABNORMAL LOW (ref 39.0–52.0)
Hemoglobin: 12 g/dL — ABNORMAL LOW (ref 13.0–17.0)
MCH: 29.6 pg (ref 26.0–34.0)
MCHC: 32.3 g/dL (ref 30.0–36.0)
MCV: 91.4 fL (ref 80.0–100.0)
Platelets: 277 10*3/uL (ref 150–400)
RBC: 4.06 MIL/uL — ABNORMAL LOW (ref 4.22–5.81)
RDW: 14.3 % (ref 11.5–15.5)
WBC: 5.2 10*3/uL (ref 4.0–10.5)
nRBC: 0 % (ref 0.0–0.2)

## 2022-03-21 MED ORDER — DIPHENHYDRAMINE HCL 12.5 MG/5ML PO ELIX: 12.5000 mg | ORAL_SOLUTION | Freq: Four times a day (QID) | ORAL | Status: DC | PRN

## 2022-03-21 MED ORDER — AMOXICILLIN 250 MG PO CAPS
1000.0000 mg | ORAL_CAPSULE | Freq: Three times a day (TID) | ORAL | Status: AC
  Administered 2022-03-21 – 2022-03-22 (×3): 1000 mg via ORAL
  Filled 2022-03-21 (×3): qty 4

## 2022-03-21 MED ORDER — ENOXAPARIN SODIUM 60 MG/0.6ML IJ SOSY
60.0000 mg | PREFILLED_SYRINGE | INTRAMUSCULAR | Status: DC
  Administered 2022-03-21 – 2022-03-30 (×10): 60 mg via SUBCUTANEOUS
  Filled 2022-03-21 (×10): qty 0.6

## 2022-03-21 MED ORDER — ASCORBIC ACID 500 MG PO TABS
1000.0000 mg | ORAL_TABLET | Freq: Every day | ORAL | Status: DC
  Administered 2022-03-22 – 2022-03-31 (×10): 1000 mg via ORAL
  Filled 2022-03-21 (×10): qty 2

## 2022-03-21 MED ORDER — TORSEMIDE 20 MG PO TABS: 20.0000 mg | ORAL_TABLET | Freq: Every day | ORAL | 1 refills | Status: DC

## 2022-03-21 MED ORDER — FLEET ENEMA 7-19 GM/118ML RE ENEM: 1.0000 | ENEMA | Freq: Once | RECTAL | Status: DC | PRN

## 2022-03-21 MED ORDER — TRAZODONE HCL 50 MG PO TABS
25.0000 mg | ORAL_TABLET | Freq: Every evening | ORAL | Status: DC | PRN
  Administered 2022-03-22 – 2022-03-30 (×6): 50 mg via ORAL
  Filled 2022-03-21 (×6): qty 1

## 2022-03-21 MED ORDER — PANTOPRAZOLE SODIUM 40 MG PO TBEC: 40.0000 mg | DELAYED_RELEASE_TABLET | Freq: Every day | ORAL | Status: DC

## 2022-03-21 MED ORDER — POLYETHYLENE GLYCOL 3350 17 G PO PACK: 17.0000 g | PACK | Freq: Two times a day (BID) | ORAL | 0 refills | Status: DC

## 2022-03-21 MED ORDER — ACETAMINOPHEN 325 MG PO TABS
325.0000 mg | ORAL_TABLET | ORAL | Status: DC | PRN
  Administered 2022-03-24 – 2022-03-30 (×10): 650 mg via ORAL
  Filled 2022-03-21 (×10): qty 2

## 2022-03-21 MED ORDER — INSULIN ASPART 100 UNIT/ML IJ SOLN
3.0000 [IU] | Freq: Three times a day (TID) | INTRAMUSCULAR | Status: DC
  Administered 2022-03-21 – 2022-03-27 (×17): 3 [IU] via SUBCUTANEOUS

## 2022-03-21 MED ORDER — PROCHLORPERAZINE 25 MG RE SUPP
12.5000 mg | Freq: Four times a day (QID) | RECTAL | Status: DC | PRN
Start: 2022-03-21 — End: 2022-03-31

## 2022-03-21 MED ORDER — SENNOSIDES-DOCUSATE SODIUM 8.6-50 MG PO TABS
1.0000 | ORAL_TABLET | Freq: Two times a day (BID) | ORAL | Status: DC
  Administered 2022-03-21 – 2022-03-31 (×10): 1 via ORAL
  Filled 2022-03-21 (×18): qty 1

## 2022-03-21 MED ORDER — PANTOPRAZOLE SODIUM 40 MG PO TBEC
40.0000 mg | DELAYED_RELEASE_TABLET | Freq: Every day | ORAL | Status: DC
  Administered 2022-03-22 – 2022-03-31 (×10): 40 mg via ORAL
  Filled 2022-03-21 (×10): qty 1

## 2022-03-21 MED ORDER — PROCHLORPERAZINE MALEATE 5 MG PO TABS: 5.0000 mg | ORAL_TABLET | Freq: Four times a day (QID) | ORAL | Status: DC | PRN

## 2022-03-21 MED ORDER — MIDODRINE HCL 5 MG PO TABS: 5.0000 mg | ORAL_TABLET | Freq: Two times a day (BID) | ORAL | Status: DC

## 2022-03-21 MED ORDER — ROSUVASTATIN CALCIUM 20 MG PO TABS
20.0000 mg | ORAL_TABLET | Freq: Every day | ORAL | Status: DC
  Administered 2022-03-22 – 2022-03-31 (×10): 20 mg via ORAL
  Filled 2022-03-21 (×10): qty 1

## 2022-03-21 MED ORDER — TORSEMIDE 20 MG PO TABS
20.0000 mg | ORAL_TABLET | Freq: Every day | ORAL | Status: DC
  Administered 2022-03-22 – 2022-03-29 (×8): 20 mg via ORAL
  Filled 2022-03-21 (×8): qty 1

## 2022-03-21 MED ORDER — JUVEN PO PACK
1.0000 | PACK | Freq: Two times a day (BID) | ORAL | Status: DC
Start: 2022-03-22 — End: 2022-03-31
  Administered 2022-03-22 – 2022-03-28 (×9): 1 via ORAL
  Filled 2022-03-21 (×10): qty 1

## 2022-03-21 MED ORDER — PROCHLORPERAZINE EDISYLATE 10 MG/2ML IJ SOLN: 5.0000 mg | Freq: Four times a day (QID) | INTRAMUSCULAR | Status: DC | PRN

## 2022-03-21 MED ORDER — VITAMIN D 25 MCG (1000 UNIT) PO TABS
2000.0000 [IU] | ORAL_TABLET | Freq: Every day | ORAL | Status: DC
  Administered 2022-03-22 – 2022-03-31 (×10): 2000 [IU] via ORAL
  Filled 2022-03-21 (×10): qty 2

## 2022-03-21 MED ORDER — TRAMADOL HCL 50 MG PO TABS
50.0000 mg | ORAL_TABLET | Freq: Four times a day (QID) | ORAL | Status: DC | PRN
  Filled 2022-03-21: qty 1

## 2022-03-21 MED ORDER — POLYETHYLENE GLYCOL 3350 17 G PO PACK
17.0000 g | PACK | Freq: Two times a day (BID) | ORAL | Status: DC
  Administered 2022-03-22 – 2022-03-29 (×3): 17 g via ORAL
  Filled 2022-03-21 (×17): qty 1

## 2022-03-21 MED ORDER — OXYCODONE HCL 5 MG PO TABS
5.0000 mg | ORAL_TABLET | ORAL | Status: DC | PRN
  Administered 2022-03-25: 10 mg via ORAL
  Administered 2022-03-28: 5 mg via ORAL
  Filled 2022-03-21: qty 2
  Filled 2022-03-21: qty 1

## 2022-03-21 MED ORDER — MIDODRINE HCL 5 MG PO TABS
5.0000 mg | ORAL_TABLET | Freq: Two times a day (BID) | ORAL | Status: DC
  Administered 2022-03-21: 5 mg via ORAL
  Filled 2022-03-21: qty 1

## 2022-03-21 MED ORDER — INSULIN ASPART 100 UNIT/ML IJ SOLN: 0.0000 [IU] | Freq: Every day | INTRAMUSCULAR | Status: DC

## 2022-03-21 MED ORDER — GEMFIBROZIL 600 MG PO TABS
600.0000 mg | ORAL_TABLET | Freq: Two times a day (BID) | ORAL | Status: DC
  Administered 2022-03-21 – 2022-03-31 (×20): 600 mg via ORAL
  Filled 2022-03-21 (×20): qty 1

## 2022-03-21 MED ORDER — ADULT MULTIVITAMIN W/MINERALS CH
1.0000 | ORAL_TABLET | Freq: Every day | ORAL | Status: DC
  Administered 2022-03-22 – 2022-03-31 (×10): 1 via ORAL
  Filled 2022-03-21 (×10): qty 1

## 2022-03-21 MED ORDER — INSULIN ASPART 100 UNIT/ML IJ SOLN
0.0000 [IU] | Freq: Three times a day (TID) | INTRAMUSCULAR | Status: DC
  Administered 2022-03-22 (×2): 3 [IU] via SUBCUTANEOUS
  Administered 2022-03-22 – 2022-03-23 (×2): 4 [IU] via SUBCUTANEOUS
  Administered 2022-03-24 (×2): 3 [IU] via SUBCUTANEOUS
  Administered 2022-03-25: 7 [IU] via SUBCUTANEOUS
  Administered 2022-03-26: 4 [IU] via SUBCUTANEOUS
  Administered 2022-03-27: 11 [IU] via SUBCUTANEOUS
  Administered 2022-03-28: 7 [IU] via SUBCUTANEOUS
  Administered 2022-03-29: 3 [IU] via SUBCUTANEOUS
  Administered 2022-03-30: 4 [IU] via SUBCUTANEOUS
  Administered 2022-03-31: 3 [IU] via SUBCUTANEOUS

## 2022-03-21 MED ORDER — METHOCARBAMOL 500 MG PO TABS: 500.0000 mg | ORAL_TABLET | Freq: Four times a day (QID) | ORAL | Status: DC | PRN

## 2022-03-21 MED ORDER — RISAQUAD PO CAPS
1.0000 | ORAL_CAPSULE | Freq: Every day | ORAL | Status: DC
  Administered 2022-03-22 – 2022-03-31 (×10): 1 via ORAL
  Filled 2022-03-21 (×10): qty 1

## 2022-03-21 MED ORDER — GUAIFENESIN-DM 100-10 MG/5ML PO SYRP: 5.0000 mL | ORAL_SOLUTION | Freq: Four times a day (QID) | ORAL | Status: DC | PRN

## 2022-03-21 MED ORDER — EZETIMIBE 10 MG PO TABS
10.0000 mg | ORAL_TABLET | Freq: Every day | ORAL | Status: DC
Start: 2022-03-22 — End: 2022-03-31
  Administered 2022-03-22 – 2022-03-31 (×10): 10 mg via ORAL
  Filled 2022-03-21 (×10): qty 1

## 2022-03-21 MED ORDER — ALUM & MAG HYDROXIDE-SIMETH 200-200-20 MG/5ML PO SUSP: 30.0000 mL | ORAL | Status: DC | PRN

## 2022-03-21 MED ORDER — TRIAMCINOLONE ACETONIDE 55 MCG/ACT NA AERO
1.0000 | INHALATION_SPRAY | Freq: Every day | NASAL | Status: DC
  Administered 2022-03-22 – 2022-03-30 (×9): 1 via NASAL
  Filled 2022-03-21: qty 10.8

## 2022-03-21 MED ORDER — ZINC SULFATE 220 (50 ZN) MG PO CAPS
220.0000 mg | ORAL_CAPSULE | Freq: Every day | ORAL | Status: AC
  Administered 2022-03-22 – 2022-03-25 (×4): 220 mg via ORAL
  Filled 2022-03-21 (×4): qty 1

## 2022-03-21 MED ORDER — OXYCODONE HCL 5 MG PO TABS: 5.0000 mg | ORAL_TABLET | ORAL | 0 refills | Status: DC | PRN

## 2022-03-21 MED ORDER — INSULIN GLARGINE-YFGN 100 UNIT/ML ~~LOC~~ SOLN
40.0000 [IU] | Freq: Every day | SUBCUTANEOUS | Status: DC
  Administered 2022-03-22: 40 [IU] via SUBCUTANEOUS
  Filled 2022-03-21 (×2): qty 0.4

## 2022-03-21 MED ORDER — TORSEMIDE 20 MG PO TABS: 20.0000 mg | ORAL_TABLET | Freq: Every day | ORAL | Status: DC

## 2022-03-21 MED ORDER — ENOXAPARIN SODIUM 60 MG/0.6ML IJ SOSY: 60.0000 mg | PREFILLED_SYRINGE | INTRAMUSCULAR | Status: DC

## 2022-03-21 MED ORDER — CALCIUM CARBONATE ANTACID 500 MG PO CHEW: 1.0000 | CHEWABLE_TABLET | Freq: Three times a day (TID) | ORAL | Status: DC | PRN

## 2022-03-21 MED ORDER — ASPIRIN 81 MG PO TBEC
81.0000 mg | DELAYED_RELEASE_TABLET | Freq: Every day | ORAL | Status: DC
Start: 2022-03-22 — End: 2022-03-31
  Administered 2022-03-22 – 2022-03-31 (×10): 81 mg via ORAL
  Filled 2022-03-21 (×10): qty 1

## 2022-03-21 MED ORDER — SACUBITRIL-VALSARTAN 24-26 MG PO TABS
1.0000 | ORAL_TABLET | Freq: Two times a day (BID) | ORAL | Status: DC
  Administered 2022-03-21 – 2022-03-31 (×20): 1 via ORAL
  Filled 2022-03-21 (×20): qty 1

## 2022-03-21 MED ORDER — CLOPIDOGREL BISULFATE 75 MG PO TABS
75.0000 mg | ORAL_TABLET | Freq: Every day | ORAL | Status: DC
  Administered 2022-03-22 – 2022-03-31 (×10): 75 mg via ORAL
  Filled 2022-03-21 (×10): qty 1

## 2022-03-21 NOTE — H&P (Signed)
? ?CC: Debility secondary to right BKA ?  ?HPI: Eduardo [name] is a 66 year old male who presented to the emergency department on 03/08/2022 with complaints of fever, chills malaise and painful swollen right foot wound for several days.  He was hypotensive and required Levophed.  Admitted to the ICU on broad-spectrum antibiotics.  His past medical history significant for diabetes mellitus and peripheral arterial disease.  Orthopedic surgery consulted and noted exposed bone at the base of the fifth metatarsal and cellulitis extending to the peroneal tendons.  An MRI showed osteomyelitis and tenosynovitis.  He stabilized and was taken to the operating room on 5/3 for right below the knee amputation by Dr. Sharol Given.  Diuresis started on 5/5 for volume overload secondary to IV fluid resuscitation.  He was transitioned to oral diuretics.  Infectious disease consultation obtained and he underwent TTE.  Antibiotics were narrowed to penicillin.  No evidence of endocarditis.  Blood cultures were positive for Streptococcus anginosus.  Repeat blood cultures are negative.  He will complete 2-week course of antibiotics on 5/13.The patient requires inpatient physical medicine and rehabilitation evaluations and treatment secondary to dysfunction due to right below the knee amputation. ?  ?Patient's medical history is also significant for acute on chronic combined systolic and diastolic congestive heart failure.  His ejection fraction is estimated at 20 to 25%.  He will continue on home torsemide and Entresto. ?History of bilateral Dupuytren hand contractions. ?  ?He reports some constipation and LBM about 2 days ago. Pain well controlled today. ?Ortho recommends Praveena plus wound vac for about a week week and then transition to stump shrinker. ? ?He enjoys fishing as a hobby.  ? ?Review of Systems  ?Constitutional:  Negative for chills and fever.  ?HENT:  Negative for hearing loss and sore throat.   ?Eyes:  Negative for blurred vision  and double vision.  ?Respiratory:  Negative for cough and sputum production.   ?Cardiovascular:  Negative for chest pain and palpitations.  ?Gastrointestinal:  Positive for abdominal pain and constipation. Negative for nausea and vomiting.  ?Genitourinary:  Negative for dysuria and urgency.  ?Musculoskeletal:  Negative for back pain and neck pain.  ?Skin:  Negative for itching and rash.  ?Neurological:  Negative for dizziness and headaches.  ?Psychiatric/Behavioral:  Negative for depression. The patient does not have insomnia.   ?    ?Past Medical History:  ?Diagnosis Date  ? CHF (congestive heart failure) ([place])    ? Chronic kidney disease    ?  stage 3  ? Colon polyp    ? Coronary artery disease    ? Diabetes mellitus 1987  ?  under care of Dr. Chalmers Cater.  On insulin since 96 (off and on)  ? Diabetic retinopathy    ? Dupuytren contracture    ?  R hand, s/p injection (Dr. Lenon Curt)  ? Essential hypertension, benign    ? Essential hypertension, benign 02/06/2019  ? Frequency of urination and polyuria    ? Hypertension    ? Myocardial infarction [facility])    ?  denies  ? Neuromuscular disorder ([place])    ?  Diabetic neuropathy  ? Osteomyelitis ([place])    ?  right foot  ? Other testicular hypofunction    ? Peripheral arterial disease ([place]) 10/28/2012  ? Peritoneal abscess ([place]) 6/08  ?  and buttock.  ? Pneumonia    ? Polydipsia    ? Proteinuria    ? Pure hyperglyceridemia    ? Subacute osteomyelitis, right ankle  and foot ([place])    ? Wears glasses    ?  ?     ?Past Surgical History:  ?Procedure Laterality Date  ? ABDOMINAL AORTAGRAM N/A 04/18/2012  ?  Procedure: ABDOMINAL AORTAGRAM;  Surgeon: Angelia Mould, MD;  Location: [facility] CATH LAB;  Service: Cardiovascular;  Laterality: N/A;  ? AMPUTATION Right 05/19/2019  ?  Procedure: RIGHT FOOT 5TH RAY AMPUTATION;  Surgeon: Newt Minion, MD;  Location: [place];  Service: Orthopedics;  Laterality: Right;  ? AMPUTATION Right 03/11/2022  ?  Procedure: RIGHT LEG DEBRIDEMENT VS. BELOW KNEE  AMPUTATION;  Surgeon: Newt Minion, MD;  Location: [place];  Service: Orthopedics;  Laterality: Right;  ? CARDIAC CATHETERIZATION N/A 09/08/2016  ?  Procedure: Left Heart Cath and Coronary Angiography;  Surgeon: Adrian Prows, MD;  Location: [place] CV LAB;  Service: Cardiovascular;  Laterality: N/A;  ? CATARACT EXTRACTION, BILATERAL   09/2017, 10/2017  ?  Dr. Herbert Deaner  ? COLONOSCOPY W/ BIOPSIES AND POLYPECTOMY      ? CORONARY/GRAFT ACUTE MI REVASCULARIZATION N/A 10/11/2020  ?  Procedure: Coronary/Graft Acute MI Revascularization;  Surgeon: Adrian Prows, MD;  Location: [place] CV LAB;  Service: Cardiovascular;  Laterality: N/A;  ? I & D EXTREMITY Right 03/11/2022  ?  Procedure: BELOW KNEE AMPUTATION;  Surgeon: Newt Minion, MD;  Location: [place];  Service: Orthopedics;  Laterality: Right;  ? LEFT HEART CATH N/A 12/31/2020  ?  Procedure: Left Heart Cath;  Surgeon: Adrian Prows, MD;  Location: [place] CV LAB;  Service: Cardiovascular;  Laterality: N/A;  ? LEFT HEART CATH AND CORONARY ANGIOGRAPHY N/A 10/11/2020  ?  Procedure: LEFT HEART CATH AND CORONARY ANGIOGRAPHY;  Surgeon: Adrian Prows, MD;  Location: [place] CV LAB;  Service: Cardiovascular;  Laterality: N/A;  ? LOWER EXTREMITY ANGIOGRAM Bilateral 04/18/2012  ?  Procedure: LOWER EXTREMITY ANGIOGRAM;  Surgeon: Angelia Mould, MD;  Location: [facility] CATH LAB;  Service: Cardiovascular;  Laterality: Bilateral;  bilat lower extrem angio  ? LOWER EXTREMITY ANGIOGRAPHY Bilateral 05/02/2019  ?  Procedure: LOWER EXTREMITY ANGIOGRAPHY;  Surgeon: Adrian Prows, MD;  Location: [place] CV LAB;  Service: Cardiovascular;  Laterality: Bilateral;  ? LOWER EXTREMITY ANGIOGRAPHY Bilateral 02/28/2019  ?  Procedure: LOWER EXTREMITY ANGIOGRAPHY;  Surgeon: Adrian Prows, MD;  Location: [place] CV LAB;  Service: Cardiovascular;  Laterality: Bilateral;  ? macular photocoagulation      ?  (eye treatments for diabetic retinopathy)-Dr. Zigmund Daniel  ? PERIPHERAL VASCULAR INTERVENTION    02/28/2019  ?  Procedure: PERIPHERAL VASCULAR INTERVENTION;  Surgeon: Adrian Prows, MD;  Location: [place] CV LAB;  Service: Cardiovascular;;  ? TEE WITHOUT CARDIOVERSION N/A 03/18/2022  ?  Procedure: TRANSESOPHAGEAL ECHOCARDIOGRAM (TEE);  Surgeon: Adrian Prows, MD;  Location: [place];  Service: Cardiovascular;  Laterality: N/A;  ? VENTRICULAR ASSIST DEVICE INSERTION N/A 12/31/2020  ?  Procedure: VENTRICULAR ASSIST DEVICE INSERTION;  Surgeon: Adrian Prows, MD;  Location: [place] CV LAB;  Service: Cardiovascular;  Laterality: N/A;  ?  ?     ?Family History  ?Problem Relation Age of Onset  ? Diabetes Mother    ? Hearing loss Mother    ? Hypertension Mother    ? Hyperlipidemia Mother    ? Heart disease Mother    ? Varicose Veins Mother    ? Varicose Veins Father    ? Dementia Father    ? Hyperlipidemia Brother    ? Diabetes Maternal Grandmother    ?  ?Social  History:  reports that he quit smoking about 10 years ago. His smoking use included cigarettes. He has a 30.00 pack-year smoking history. He has never used smokeless tobacco. He reports that he does not currently use alcohol. He reports that he does not use drugs. ?Allergies:  ?     ?Allergies  ?Allergen Reactions  ? Shellfish-Derived Products Nausea And Vomiting and Other (See Comments)  ?    Only Mussels cause severe nausea and vomiting ?   ? Latex Rash  ? Tape Rash and Other (See Comments)  ?    Caused issues with the skin  ? Testosterone Rash  ?    Other reaction(s): did not feel well ?Other reaction(s): did not feel well  ?  ?      ?Medications Prior to Admission  ?Medication Sig Dispense Refill  ? acetaminophen (TYLENOL) 325 MG tablet Take 650 mg by mouth every 6 (six) hours as needed for mild pain (or headaches).      ? aspirin 81 MG chewable tablet Chew 1 tablet (81 mg total) by mouth daily.      ? Bioflavonoid Products (ESTER C PO) Take 1,000 mg by mouth daily.      ? carvedilol (COREG) 3.125 MG tablet TAKE 1 TABLET BY MOUTH  TWICE DAILY WITH MEALS  (Patient taking differently: Take 3.125 mg by mouth 2 (two) times daily with a meal.) 180 tablet 3  ? Cholecalciferol (VITAMIN D) 2000 units CAPS Take 2,000 Units by mouth daily.      ? ENTRESTO 24-26 MG TAKE 1 TABLET BY

## 2022-03-21 NOTE — Progress Notes (Signed)
Inpatient Rehabilitation Admission Medication Review by a Pharmacist ? ?A complete drug regimen review was completed for this patient to identify any potential clinically significant medication issues. ? ?High Risk Drug Classes Is patient taking? Indication by Medication  ?Antipsychotic Yes Compazine- N/V  ?Anticoagulant Yes Lovenox- VTE prophylaxis  ?Antibiotic Yes Amoxicillin- DFI  ?Opioid Yes OxyIR, Tramadol- acute pain  ?Antiplatelet Yes Aspirin, plavix- PVD  ?Hypoglycemics/insulin Yes Insulin- T2DM  ?Vasoactive Medication Yes Entresto- HFrEF  ?Chemotherapy No   ?Other Yes Crestor, Lopid, Zetia- HLD ?Protonix- GERD ?Trazodone- sleep ?Robaxin- muscle spasms  ? ? ? ?Type of Medication Issue Identified Description of Issue Recommendation(s)  ?Drug Interaction(s) (clinically significant) ?    ?Duplicate Therapy ?    ?Allergy ?    ?No Medication Administration End Date ?    ?Incorrect Dose ?    ?Additional Drug Therapy Needed ?    ?Significant med changes from prior encounter (inform family/care partners about these prior to discharge).    ?Other ? PTA meds: ?Coreg ?Jardiance ?Imdur ?NitroSL ?Mounjaro ?Demedex Restart PTA meds when and if clinically necessary on CIR or at time of discharge, if warranted  ? ? ?Clinically significant medication issues were identified that warrant physician communication and completion of prescribed/recommended actions by midnight of the next day:  No ? ?Time spent performing this drug regimen review (minutes):  30 ? ? ?Pasty Manninen BS, PharmD, BCPS ?Clinical Pharmacist ?03/22/2022 7:01 AM ? ?Contact: [phone] after 3 PM ? ?"Be curious, not judgmental..." -Jamal Maes ?

## 2022-03-21 NOTE — Progress Notes (Signed)
Patient ID: Eduardo [name], male   DOB: [birth year], 66 y.o.   MRN: [id number] ?Patient without complaints.  Anticipate discharge to inpatient rehab.  The hospital wound VAC can be transitioned over to the [facility] plus wound VAC for discharge to inpatient rehab.  The Praveena plus will be on for about a week and then transition to the stump shrinker. ?

## 2022-03-21 NOTE — Progress Notes (Signed)
Pt arrived to unit from [facility]. Admission assessment and questions completed. Skin assessment completed with [facility]. Pt has wound vac to Right BKA. Pt oriented to unit and instructed to call before getting up, verbalized understanding. Bed locked and in lowest position with bed alarm set. Call bell and belongings within reach. ?

## 2022-03-21 NOTE — Progress Notes (Signed)
Inpatient Rehabilitation Admission Medication Review by a Pharmacist ? ?A complete drug regimen review was completed for this patient to identify any potential clinically significant medication issues. ? ?High Risk Drug Classes Is patient taking? Indication by Medication  ?Antipsychotic Yes Compazine- N/V  ?Anticoagulant Yes Lovenox- VTE prophylaxis  ?Antibiotic Yes Amoxicillin- DFI  ?Opioid Yes OxyIR, Tramadol- acute pain  ?Antiplatelet Yes Aspirin, plavix- PVD  ?Hypoglycemics/insulin Yes Insulin- T2DM  ?Vasoactive Medication Yes Entresto- HFrEF  ?Chemotherapy No   ?Other Yes Crestor, Lopid, Zetia- HLD ?Protonix- GERD ?Trazodone- sleep ?Robaxin- muscle spasms  ? ? ? ?Type of Medication Issue Identified Description of Issue Recommendation(s)  ?Drug Interaction(s) (clinically significant) ?    ?Duplicate Therapy ?    ?Allergy ?    ?No Medication Administration End Date ?    ?Incorrect Dose ?    ?Additional Drug Therapy Needed ?    ?Significant med changes from prior encounter (inform family/care partners about these prior to discharge).    ?Other ? PTA meds: ?Coreg ?Jardiance ?Imdur ?NitroSL ?Mounjaro ?Demedex Restart PTA meds when and if clinically necessary on CIR or at time of discharge, if warranted  ? ? ?Clinically significant medication issues were identified that warrant physician communication and completion of prescribed/recommended actions by midnight of the next day:  No ? ?Time spent performing this drug regimen review (minutes):  30 ? ? ?Earle Reome BS, PharmD, BCPS ?Clinical Pharmacist ?03/21/2022 2:32 PM ? ?Contact: [phone] after 3 PM ? ?"Be curious, not judgmental..." -Jamal Maes ?

## 2022-03-21 NOTE — Discharge Summary (Signed)
Physician Discharge Summary  ?Eduardo [name] XNA:[id number] DOB: September 21, 1956 DOA: 03/08/2022 ? ?PCP: Rita Ohara, MD ? ?Admit date: 03/08/2022 ?Discharge date: 03/21/2022 ? ?Time spent: 45 minutes ? ?Recommendations for Outpatient Follow-up:  ?Orthopedics Dr. Sharol Given in 1 week ?Cardiology Dr. Einar Gip in 2 weeks ?PCP in 1 week, please titrate up torsemide dose to 20 Mg twice daily when blood pressure tolerates and DC midodrine ? ? ?Discharge Diagnoses:  ?Principal Problem: ?  Septic shock ([place]) ?Active Problems: ?  Osteomyelitis of right foot ([place]) ?  Streptococcal bacteremia ?  Acute on chronic combined systolic and diastolic CHF (congestive heart failure) ([place]) ?  Morbid obesity with BMI of 40.0-44.9, adult ([place]) ?  PAD (peripheral artery disease) ([place]) ?  Coronary artery disease involving native coronary artery of native heart without angina pectoris ?  Stage 3b chronic kidney disease (CKD) ([place]) ?  Controlled type 2 diabetes mellitus with hyperglycemia, with long-term current use of insulin ([place]) ?  Essential hypertension, benign ?  AKI (acute kidney injury) ([place]) ?  Hypophosphatemia ?  Ischemic cardiomyopathy ? ? ?Discharge Condition: stable ? ?Diet recommendation: low sodium  ? ?Filed Weights  ? 03/18/22 0820 03/20/22 0430 03/21/22 0348  ?Weight: 118 kg 117.4 kg 118.2 kg  ? ? ?History of present illness:  ?[name] is a 66 y.o. M with CAD s/p CABG 2017, PVD s/p R SFA stent 2020, DM, chronic heart failure EF 15-20%, chronic kidney disease and prior right partial foot amputation who presented with few days fever, chills, malaise and painful swollen right foot wound.  In the ED he was noted to be in shock, started on peripheral Levophed and admitted to the ICU on vancomycin and Cefepime. ?-4/30: Admitted to ICU ? ?Hospital Course:  ? ?66 y.o. M with CAD s/p CABG 2017, PVD s/p R SFA stent 2020, DM, chronic heart failure EF 15-20%, chronic kidney disease and prior right partial foot amputation who presented with few days fever,  chills, malaise and painful swollen right foot wound.  In the ED he was noted to be in shock, started on peripheral Levophed and admitted to the ICU on vancomycin and Cefepime. ?-4/30: Admitted to ICU ?5/1: Ortho consulted, noted exposed bone at base of 5th metatarsal and cellulitis up peroneal tendons ?5/2: MRI shows osteo, tenosynovitis; able to stop Levophed that night ?5/3: To OR for RIGHT BKA by Dr. Sharol Given with [facility], pressors resumed post-op ?5/4: Off pressors again ?5/8: ID consulted, TTE ordered, narrowed to penicillin ?5/10: TEE negative for endocarditis, ID switched ABX to amoxicillin ? ?Detailed course ? ?Septic shock (HCC)-poa ?-Secondary to strep bacteremia, diabetic foot infection ?-Treated with Levophed for several days in the ICU, hemodynamics improved back to baseline. ?-Now improved, antibiotics switched to amoxicillin, completed course on 5/13 ?-CIR for rehab  ?  ?Osteomyelitis of right foot ([place]) ?S/p right BKA by Dr. Sharol Given on 5/4 ?-wound vac, pain control ?-Dr. Sharol Given to follow-up regarding VAC ?  ?Streptococcal bacteremia ?Blood cultures on admission grew Streptococcus anginosus, source is likely diabetic foot infection. ID consulted, ?-Repeat blood cultures are negative ?-TEE 5/10 negative for endocarditis ?-changed to oral amoxicillin for 3 more days  (to complete 2-week course) on 5/13 ?  ?Acute on chronic combined systolic and diastolic CHF (congestive heart failure) ([place]) ?Patient's baseline EF is less than 20% due to ischemic heart disease. ?- now euvolemic, Continue home regimen of torsemide, Entresto ?- Hold Imdur, jardiance was stopped by endo in Nov 22 ?-Clinically euvolemic ?  ?Hypophosphatemia ?Supplemented and  resolved ?  ?AKI (acute kidney injury) on CKD3a ?Creatinine 2.1 on admission, improved ?-baseline creatinine 1.5-1.8 ?  ?Essential hypertension, benign ?Blood pressure soft still.  Baseline BP 23-536 systolic given low EF. ?-  carvedilol on hold, resume if BP trends up ?   ?Controlled type 2 diabetes mellitus with hyperglycemia, with long-term current use of insulin ([place]) ?Glucose overall controlled. ?- Continue glargine, SSI ?  ?Coronary artery disease involving native coronary artery of native heart without angina pectoris ?- Continue Zetia, gemfibrozil, Crestor ?- Continue aspirin, Plavix ?- Continue Entresto ?-Restart Coreg when blood pressure trends up ?  ?PAD (peripheral artery disease) ([place]) ?- Continue Crestor, Zetia, gemfibrozil ?- Contineu aspirin, plavix ?  ?Constipation ?- senokot, dulcolax supp, increase miralax to BID ?  ?Morbid obesity with BMI of 40.0-44.9, adult ([place]) ?BMI 42 ?  ? ?  ?Consultants:  ?  ?  ?Procedures: 5/3: R BKA ?TEE 5/21, negative for endocarditis ?Discharge Exam: ?Vitals:  ? 03/21/22 0348 03/21/22 0851  ?BP: (!) 94/56 96/61  ?Pulse: 61   ?Resp: 19   ?Temp: 98 ?F (36.7 ?C) 98.4 ?F (36.9 ?C)  ?SpO2: 91% 96%  ? ?Gen: Awake, Alert, Oriented X 3,  ?HEENT: no JVD ?Lungs: Good air movement bilaterally, CTAB ?CVS: S1S2/RRR ?Abd: soft, Non tender, non distended, BS present ?Extremities: Right BKA with wound VAC ?Skin: no new rashes on exposed skin  ?Discharge Instructions ? ? ?Discharge Instructions   ? ? Negative Pressure Wound Therapy - Incisional   Complete by: As directed ?  ? ?  ? ?Allergies as of 03/21/2022   ? ?   Reactions  ? Shellfish-derived Products Nausea And Vomiting, Other (See Comments)  ? Only Mussels cause severe nausea and vomiting  ? Latex Rash  ? Tape Rash, Other (See Comments)  ? Caused issues with the skin  ? Testosterone Rash  ? Other reaction(s): did not feel well ?Other reaction(s): did not feel well  ? ?  ? ?  ?Medication List  ?  ? ?STOP taking these medications   ? ?carvedilol 3.125 MG tablet ?Commonly known as: COREG ?  ?empagliflozin 10 MG Tabs tablet ?Commonly known as: JARDIANCE ?  ?isosorbide mononitrate 60 MG 24 hr tablet ?Commonly known as: IMDUR ?  ? ?  ? ?TAKE these medications   ? ?acetaminophen 325 MG tablet ?Commonly  known as: TYLENOL ?Take 650 mg by mouth every 6 (six) hours as needed for mild pain (or headaches). ?  ?aspirin 81 MG chewable tablet ?Chew 1 tablet (81 mg total) by mouth daily. ?  ?clopidogrel 75 MG tablet ?Commonly known as: PLAVIX ?TAKE 1 TABLET BY MOUTH DAILY ?  ?Entresto 24-26 MG ?Generic drug: sacubitril-valsartan ?TAKE 1 TABLET BY MOUTH  TWICE DAILY ?  ?ESTER C PO ?Take 1,000 mg by mouth daily. ?  ?ezetimibe 10 MG tablet ?Commonly known as: Zetia ?Take 1 tablet (10 mg total) by mouth daily. ?  ?gemfibrozil 600 MG tablet ?Commonly known as: LOPID ?Take 600 mg by mouth 2 (two) times daily before a meal. ?  ?HumaLOG KwikPen 100 UNIT/ML KwikPen ?Generic drug: insulin lispro ?Inject 0-10 Units into the skin See admin instructions. Inject 0-10 units into the skin three times a day, per sliding scale- based on BGL >100 ?  ?HumuLIN N KwikPen 100 UNIT/ML Kiwkpen ?Generic drug: Insulin NPH (Human) ([place]) ?Inject 30 Units into the skin See admin instructions. Inject 30 units into the skin before breakfast and 15 U before supper ?  ?Krill Oil 300  MG Caps ?  ?midodrine 5 MG tablet ?Commonly known as: PROAMATINE ?Take 1 tablet (5 mg total) by mouth 2 (two) times daily with a meal. ?  ?multivitamin with minerals Tabs tablet ?Take 1 tablet by mouth daily. ?  ?nitroGLYCERIN 0.4 MG SL tablet ?Commonly known as: NITROSTAT ?Place 1 tablet (0.4 mg total) under the tongue every 5 (five) minutes as needed for chest pain. ?  ?oxyCODONE 5 MG immediate release tablet ?Commonly known as: Oxy IR/ROXICODONE ?Take 1-2 tablets (5-10 mg total) by mouth every 4 (four) hours as needed for moderate pain (pain score 4-6). ?  ?pantoprazole 40 MG tablet ?Commonly known as: PROTONIX ?Take 1 tablet (40 mg total) by mouth daily. ?Start taking on: Mar 22, 2022 ?  ?polyethylene glycol 17 g packet ?Commonly known as: MIRALAX / GLYCOLAX ?Take 17 g by mouth 2 (two) times daily. ?  ?PROBIOTIC-10 PO ?Take 1 capsule by mouth daily. ?  ?rosuvastatin 20  MG tablet ?Commonly known as: CRESTOR ?Take 1 tablet (20 mg total) by mouth daily. ?  ?SILVER SULFADIAZINE EX ?Apply 1 application. topically daily as needed (wound healing). Over the counter ?  ?sodium chlori

## 2022-03-22 DIAGNOSIS — I10 Essential (primary) hypertension: Secondary | ICD-10-CM

## 2022-03-22 DIAGNOSIS — G4700 Insomnia, unspecified: Secondary | ICD-10-CM

## 2022-03-22 LAB — GLUCOSE, CAPILLARY
Glucose-Capillary: 144 mg/dL — ABNORMAL HIGH (ref 70–99)
Glucose-Capillary: 188 mg/dL — ABNORMAL HIGH (ref 70–99)
Glucose-Capillary: 122 mg/dL — ABNORMAL HIGH (ref 70–99)
Glucose-Capillary: 103 mg/dL — ABNORMAL HIGH (ref 70–99)

## 2022-03-22 MED ORDER — MELATONIN 5 MG PO TABS
5.0000 mg | ORAL_TABLET | Freq: Every day | ORAL | Status: DC
  Administered 2022-03-22 – 2022-03-23 (×2): 5 mg via ORAL
  Filled 2022-03-22 (×2): qty 1

## 2022-03-22 MED ORDER — INSULIN GLARGINE-YFGN 100 UNIT/ML ~~LOC~~ SOLN
42.0000 [IU] | Freq: Every day | SUBCUTANEOUS | Status: DC
  Administered 2022-03-23 – 2022-03-27 (×5): 42 [IU] via SUBCUTANEOUS
  Filled 2022-03-22 (×6): qty 0.42

## 2022-03-22 NOTE — Plan of Care (Signed)
?  Problem: Consults ?Goal: RH LIMB LOSS PATIENT EDUCATION ?Description: Description: See Patient Education module for eduction specifics. ?Outcome: Progressing ?Goal: Skin Care Protocol Initiated - if Braden Score 18 or less ?Description: If consults are not indicated, leave blank or document N/A ?Outcome: Progressing ?Goal: Diabetes Guidelines if Diabetic/Glucose > 140 ?Description: If diabetic or lab glucose is > 140 mg/dl - Initiate Diabetes/Hyperglycemia Guidelines & Document Interventions  ?Outcome: Progressing ?  ?Problem: RH SKIN INTEGRITY ?Goal: RH STG MAINTAIN SKIN INTEGRITY WITH ASSISTANCE ?Description: STG Maintain Skin Integrity With [employer]. ?Outcome: Progressing ?Goal: RH STG ABLE TO PERFORM INCISION/WOUND CARE W/ASSISTANCE ?Description: STG Able To Perform Incision/Wound Care With [facility]. ?Outcome: Progressing ?  ?Problem: RH SAFETY ?Goal: RH STG ADHERE TO SAFETY PRECAUTIONS W/ASSISTANCE/DEVICE ?Description: STG Adhere to Safety Precautions With Cues and Reminders. ?Outcome: Progressing ?Goal: RH STG DECREASED RISK OF FALL WITH ASSISTANCE ?Description: STG Decreased Risk of Fall With [employer]. ?Outcome: Progressing ?  ?Problem: RH PAIN MANAGEMENT ?Goal: RH STG PAIN MANAGED AT OR BELOW PT'S PAIN GOAL ?Description: < 3 on a 0-10 pain scale. ?Outcome: Progressing ?  ?Problem: RH KNOWLEDGE DEFICIT LIMB LOSS ?Goal: RH STG INCREASE KNOWLEDGE OF SELF CARE AFTER LIMB LOSS ?Description: Patient will demonstrate knowledge of self-care management, medication/pain management, skin/wound care, weight bearing precautions with educational materials and handouts provided by staff independently at discharge. ?Outcome: Progressing ?  ?

## 2022-03-22 NOTE — Progress Notes (Signed)
?                                                       PROGRESS NOTE ? ? ?Subjective/Complaints: ?Reports therapy went well. Didn't sleep well. He says he doesn't usually sleep well in hospital. He takes melatonin at home.  ? ?ROS No fever, chills, CP, SOB, nausea ? ?Objective: ?  ?No results found. ?Recent Labs  ?  03/21/22 ?0427  ?WBC 5.2  ?HGB 12.0*  ?HCT 37.1*  ?PLT 277  ? ?Recent Labs  ?  03/21/22 ?0427  ?NA 135  ?K 3.9  ?CL 102  ?CO2 26  ?GLUCOSE 181*  ?BUN 31*  ?CREATININE 1.42*  ?CALCIUM 8.2*  ? ? ?Intake/Output Summary (Last 24 hours) at 03/22/2022 1650 ?Last data filed at 03/22/2022 1357 ?Gross per 24 hour  ?Intake 536 ml  ?Output 1640 ml  ?Net -1104 ml  ?  ? ?  ? ?Physical Exam: ?Vital Signs ?Blood pressure (!) 99/58, pulse 67, temperature 98.6 ?F (37 ?C), temperature source Oral, resp. rate 16, height '5\' 10"'$  (1.778 m), weight 122.2 kg, SpO2 99 %. ? ?General: Alert and oriented x 3, No apparent distress ?HEENT: Head is normocephalic, atraumatic, oral mucosa pink and moist ?Neck: Supple  ?Heart: Reg rate and rhythm. ?Chest: CTA bilaterally without wheezes, rales, or rhonchi;  normal effort ?Abdomen: Soft, non-tender, non-distended, bowel sounds positive. ?Extremities: No clubbing, cyanosis, or edema. Pulses are 2+ ?Psych: Pt's affect is appropriate. Pt is cooperative pleasant ?Skin: warm and dry ?Neuro:  Alert and oriented , follows commands, insight normal, memory intact, speech normal ?Sensation intact to LT b/l UE, decreased sensation in RLE. CN2-12 grossly intact ?Musculoskeletal: R BKA with wound vac and shrinker ?Strength 5/5 in b/L UE and RLE ?Bruising on b/l arms ?Dupuytren contractures b/l hands ? ?Assessment/Plan: ?1. Functional deficits which require 3+ hours per day of interdisciplinary therapy in a comprehensive inpatient rehab setting. ?Physiatrist is providing close team supervision and 24 hour management of active medical problems listed below. ?Physiatrist and rehab team continue to assess  barriers to discharge/monitor patient progress toward functional and medical goals ? ?Care Tool: ? ?Bathing ?   ?   ?   ?  ?  ?Bathing assist   ?  ?  ?Upper Body Dressing/Undressing ?Upper body dressing   ?What is the patient wearing?: [place] only ?   ?Upper body assist Assist Level: Minimal Assistance - Patient > 75% ?   ?Lower Body Dressing/Undressing ?Lower body dressing ? ? ?   ?What is the patient wearing?: [place] only ? ?  ? ?Lower body assist   ?   ? ?Toileting ?Toileting    ?Toileting assist Assist for toileting: Moderate Assistance - Patient 50 - 74% ?  ?  ?Transfers ?Chair/bed transfer ? ?Transfers assist ?   ? ?Chair/bed transfer assist level: Minimal Assistance - Patient > 75% ?  ?  ?Locomotion ?Ambulation ? ? ?Ambulation assist ? ?   ? ?Assist level: Minimal Assistance - Patient > 75% ?Assistive device: Walker-rolling ?Max distance: 15  ? ?Walk 10 feet activity ? ? ?Assist ?   ? ?Assist level: Minimal Assistance - Patient > 75% ?Assistive device: Walker-rolling  ? ?Walk 50 feet activity ? ? ?Assist Walk 50 feet with 2 turns activity did not occur: Safety/medical concerns ? ?  ?   ? ? ?  Walk 150 feet activity ? ? ?Assist Walk 150 feet activity did not occur: Safety/medical concerns ? ?  ?  ?  ? ?Walk 10 feet on uneven surface  ?activity ? ? ?Assist Walk 10 feet on uneven surfaces activity did not occur: Safety/medical concerns ? ? ?  ?   ? ?Wheelchair ? ? ? ? ?Assist Is the patient using a wheelchair?: Yes ?Type of Wheelchair: Manual ?  ? ?Wheelchair assist level: Supervision/Verbal cueing ?Max wheelchair distance: 150  ? ? ?Wheelchair 50 feet with 2 turns activity ? ? ? ?Assist ? ?  ?  ? ? ?Assist Level: Supervision/Verbal cueing  ? ?Wheelchair 150 feet activity  ? ? ? ?Assist ?   ? ? ?Assist Level: Supervision/Verbal cueing  ? ?Blood pressure (!) 99/58, pulse 67, temperature 98.6 ?F (37 ?C), temperature source Oral, resp. rate 16, height '5\' 10"'$  (1.778 m), weight 122.2 kg, SpO2 99 %. ? ?   ?Medical Problem List and Plan: ?1. 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           - He had strep bacteremia and septic shock with diabetic foot infection on omoxicillin ?            -patient may not shower due to wound vac ?            -ELOS/Goals: 9-12 days mod I to supervision with PT/OT ? -PT/OT evaluations today ?           -Wound vac for about 1 week ?2.  Antithrombotics: ?-DVT/anticoagulation:  Pharmaceutical: Lovenox 60 mg daily ?            -antiplatelet therapy: Plavix and aspirin ?3. Pain Management: Tylenol, oxycodone, tramadol as needed ?           -Well controlled ?4. Mood: LCSW to evaluate and provide emotional support ?            -antipsychotic agents: n/a ?5. Neuropsych: This patient is capable of making decisions on his own behalf. ?6. Skin/Wound Care: Routine skin care checks ?7. Fluids/Electrolytes/Nutrition: Routine Is and Os and follow-up chemistries ?8: Diabetes mellitus: CBGs 4 times daily ?-- 5/14 CGB a little elevated Semglee 40 units daily, will increase to 42 units ?            -- Continue meal coverage NovoLog 3 units with meals ?-- Continue sliding scale log with meals and bedtime ?--at home on weekly Mounjara; stopped Jardiance in February ?9: Heart failure: Daily weight ?            -- Continue torsemide 20 mg twice daily ?            -- Continue Entresto 24/26 mg twice daily ?10: Hyperlipidemia: Continue Crestor, Lopid, Zetia, discussed healthy diet ?11: Peripheral arterial disease: Continue Plavix, aspirin, statin ?12: AKI atop chronic kidney disease: Serum creatinine continues downward trend and appears at baseline.   ?-Follow up BMP Monday ?13: Coronary artery disease: Stable; continue heart meds, antiplatelet meds, antihyperlipidemia medications.  Jardiance, Imdur and Coreg currently held ?14: Constipation: Continue senokot, miralax ?15: Morbid obesity: dietary counseling, carb modified diet ?16: Essential hypertension: monitor TID/as needed.   Coreg and Jardiance currently held ? -Bps on the lower side for several days, overall stable and continue to monitor ?17. Acute blood loss anemia ?           -Follow H/H  ?18. Insomnia  ? -Start melatonin ? ?LOS: ?1 days ?A FACE TO  FACE EVALUATION WAS PERFORMED ? ?Jennye Boroughs ?03/22/2022, 4:50 PM  ? ?  ?

## 2022-03-22 NOTE — Evaluation (Addendum)
Occupational Therapy Assessment and Plan ? ?Patient Details  ?Name: Eduardo [name] ?MRN: [id number] ?Date of Birth: 10-Dec-1955 ? ?OT Diagnosis: abnormal posture, muscle weakness (generalized), and pain in joint ?Rehab Potential: Rehab Potential (ACUTE ONLY): Good ?ELOS:  7-10 days  ? ?Today's Date: 03/22/2022 ?OT Individual Time: 7824-2353 ?OT Individual Time Calculation (min): 60 min    ? ?Hospital Problem: Principal Problem: ?  S/P BKA (below knee amputation), right ([place]) ? ? ?Past Medical History:  ?Past Medical History:  ?Diagnosis Date  ? CHF (congestive heart failure) ([place])   ? Chronic kidney disease   ? stage 3  ? Colon polyp   ? Coronary artery disease   ? Diabetes mellitus 1987  ? under care of Dr. Chalmers Cater.  On insulin since 96 (off and on)  ? Diabetic retinopathy   ? Dupuytren contracture   ? R hand, s/p injection (Dr. Lenon Curt)  ? Essential hypertension, benign   ? Essential hypertension, benign 02/06/2019  ? Frequency of urination and polyuria   ? Hypertension   ? Myocardial infarction [facility])   ? denies  ? Neuromuscular disorder ([place])   ? Diabetic neuropathy  ? Osteomyelitis ([place])   ? right foot  ? Other testicular hypofunction   ? Peripheral arterial disease ([place]) 10/28/2012  ? Peritoneal abscess ([place]) 6/08  ? and buttock.  ? Pneumonia   ? Polydipsia   ? Proteinuria   ? Pure hyperglyceridemia   ? Subacute osteomyelitis, right ankle and foot ([place])   ? Wears glasses   ? ?Past Surgical History:  ?Past Surgical History:  ?Procedure Laterality Date  ? ABDOMINAL AORTAGRAM N/A 04/18/2012  ? Procedure: ABDOMINAL AORTAGRAM;  Surgeon: Angelia Mould, MD;  Location: [facility] CATH LAB;  Service: Cardiovascular;  Laterality: N/A;  ? AMPUTATION Right 05/19/2019  ? Procedure: RIGHT FOOT 5TH RAY AMPUTATION;  Surgeon: Newt Minion, MD;  Location: [place];  Service: Orthopedics;  Laterality: Right;  ? AMPUTATION Right 03/11/2022  ? Procedure: RIGHT LEG DEBRIDEMENT VS. BELOW KNEE AMPUTATION;  Surgeon: Newt Minion, MD;  Location: [place];  Service: Orthopedics;  Laterality: Right;  ? CARDIAC CATHETERIZATION N/A 09/08/2016  ? Procedure: Left Heart Cath and Coronary Angiography;  Surgeon: Adrian Prows, MD;  Location: [place] CV LAB;  Service: Cardiovascular;  Laterality: N/A;  ? CATARACT EXTRACTION, BILATERAL  09/2017, 10/2017  ? Dr. Herbert Deaner  ? COLONOSCOPY W/ BIOPSIES AND POLYPECTOMY    ? CORONARY/GRAFT ACUTE MI REVASCULARIZATION N/A 10/11/2020  ? Procedure: Coronary/Graft Acute MI Revascularization;  Surgeon: Adrian Prows, MD;  Location: [place] CV LAB;  Service: Cardiovascular;  Laterality: N/A;  ? I & D EXTREMITY Right 03/11/2022  ? Procedure: BELOW KNEE AMPUTATION;  Surgeon: Newt Minion, MD;  Location: [place];  Service: Orthopedics;  Laterality: Right;  ? LEFT HEART CATH N/A 12/31/2020  ? Procedure: Left Heart Cath;  Surgeon: Adrian Prows, MD;  Location: [place] CV LAB;  Service: Cardiovascular;  Laterality: N/A;  ? LEFT HEART CATH AND CORONARY ANGIOGRAPHY N/A 10/11/2020  ? Procedure: LEFT HEART CATH AND CORONARY ANGIOGRAPHY;  Surgeon: Adrian Prows, MD;  Location: [place] CV LAB;  Service: Cardiovascular;  Laterality: N/A;  ? LOWER EXTREMITY ANGIOGRAM Bilateral 04/18/2012  ? Procedure: LOWER EXTREMITY ANGIOGRAM;  Surgeon: Angelia Mould, MD;  Location[facility] LAB;  Service: Cardiovascular;  Laterality: Bilateral;  bilat lower extrem angio  ? LOWER EXTREMITY ANGIOGRAPHY Bilateral 05/02/2019  ? Procedure: LOWER EXTREMITY ANGIOGRAPHY;  Surgeon: Adrian Prows, MD;  Location: [facility]  INVASIVE CV LAB;  Service: Cardiovascular;  Laterality: Bilateral;  ? LOWER EXTREMITY ANGIOGRAPHY Bilateral 02/28/2019  ? Procedure: LOWER EXTREMITY ANGIOGRAPHY;  Surgeon: Adrian Prows, MD;  Location: [place] CV LAB;  Service: Cardiovascular;  Laterality: Bilateral;  ? macular photocoagulation    ? (eye treatments for diabetic retinopathy)-Dr. Zigmund Daniel  ? PERIPHERAL VASCULAR INTERVENTION  02/28/2019  ? Procedure: PERIPHERAL VASCULAR INTERVENTION;  Surgeon: Adrian Prows,  MD;  Location: [place] CV LAB;  Service: Cardiovascular;;  ? TEE WITHOUT CARDIOVERSION N/A 03/18/2022  ? Procedure: TRANSESOPHAGEAL ECHOCARDIOGRAM (TEE);  Surgeon: Adrian Prows, MD;  Location: [place];  Service: Cardiovascular;  Laterality: N/A;  ? VENTRICULAR ASSIST DEVICE INSERTION N/A 12/31/2020  ? Procedure: VENTRICULAR ASSIST DEVICE INSERTION;  Surgeon: Adrian Prows, MD;  Location: [place] CV LAB;  Service: Cardiovascular;  Laterality: N/A;  ? ? ?Assessment & Plan ?Clinical Impression:   Eduardo [name] is a 66 year old male who presented to the emergency department on 03/08/2022 with complaints of fever, chills malaise and painful swollen right foot wound for several days.  He was hypotensive and required Levophed.  Admitted to the ICU on broad-spectrum antibiotics.  His past medical history significant for diabetes mellitus and peripheral arterial disease.  Orthopedic surgery consulted and noted exposed bone at the base of the fifth metatarsal and cellulitis extending to the peroneal tendons.  An MRI showed osteomyelitis and tenosynovitis.  He stabilized and was taken to the operating room on 5/3 for right below the knee amputation  Patient transferred to [place] on 03/21/2022 .   ? ?Patient currently requires min with basic self-care skills secondary to muscle weakness and abnormal tone.  Prior to hospitalization, patient could complete BADL and IADL related task  with independently. ? ?Patient will benefit from skilled intervention to decrease level of assist with basic self-care skills prior to discharge home with care partner.  Anticipate patient will require intermittent supervision and no further OT follow recommended. ? ?OT - End of Session ?Endurance Deficit: Yes ?Endurance Deficit Description: pt presents with challenges with endurance with standing task ?OT Assessment ?Rehab Potential (ACUTE ONLY): Good ?OT Patient demonstrates impairments in the following area(s): Balance;Safety;Motor;Endurance ?OT  Basic ADL's Functional Problem(s): Dressing;Bathing;Toileting ?OT Advanced ADL's Functional Problem(s): Laundry;Simple Meal Preparation;Light Housekeeping ?OT Transfers Functional Problem(s): Tub/Shower ?OT Plan ?OT Intensity: Minimum of 1-2 x/day, 45 to 90 minutes ?OT Frequency: 5 out of 7 days ?OT Treatment/Interventions: Engineer, building services;Therapeutic Exercise;DME/adaptive equipment instruction;UE/LE Strength taining/ROM;UE/LE Coordination activities;Functional mobility training;Therapeutic Activities ?OT Self Feeding Anticipated Outcome(s): Ind ?OT Basic Self-Care Anticipated Outcome(s): ModI ?OT Toileting Anticipated Outcome(s): ModI ?OT Bathroom Transfers Anticipated Outcome(s): ModI ?OT Recommendation ?Patient destination: Home ?Follow Up Recommendations: None ?Equipment Recommended: Rolling walker with 5" wheels;3 in 1 bedside comode ? ? ?OT Evaluation ?Precautions/Restrictions  ?Precautions ?Precautions: Fall (R BKA) ?Precaution Comments: R BKA ?Required Braces or Orthoses: Other Brace ?Other Brace: limb protector ?Restrictions ?Weight Bearing Restrictions: Yes ?RLE Weight Bearing: Non weight bearing ?General ?Chart Reviewed: Yes ?Family/Caregiver Present: No ?Vital Signs ?Therapy Vitals ?Temp: 98.6 ?F (37 ?C) ?Temp Source: Oral ?Pulse Rate: 67 ?Resp: 16 ?BP: (!) 99/58 ?Patient Position (if appropriate): Sitting ?Oxygen Therapy ?SpO2: 99 % ?O2 Device: Room Air ?Patient Activity (if Appropriate): In chair ?Pain ?Pain Assessment ?Pain Score: 0-No pain ?Home Living/Prior Functioning ?Home Living ?Family/patient expects to be discharged to:: Private residence ?Living Arrangements: Spouse/significant other ?Available Help at Discharge: Available 24 hours/day, Family ?Type of Home: House ?Home Access: Stairs to enter ?Entrance Stairs-Number of Steps: 1 4" lip outside to  small platform before entering door. ?Entrance Stairs-Rails: None ?Home Layout: One level ?Bathroom  Shower/Tub: Walk-in shower ?Bathroom Toilet: Handicapped height ?Bathroom Accessibility: Yes ? Lives With: Significant other ?IADL History ?Homemaking Responsibilities:  (Wife prepares most meals) ?Current Licens

## 2022-03-22 NOTE — Evaluation (Signed)
Physical Therapy Assessment and Plan ? ?Patient Details  ?Name: Eduardo [name] ?MRN: [id number] ?Date of Birth: November 07, 1956 ? ?PT Diagnosis: Abnormality of gait, Difficulty walking, and Muscle weakness ?Rehab Potential: Good ?ELOS: 7-10 days.  ? ?Today's Date: 03/22/2022 ?PT Individual Time: 1000-1100 ?PT Individual Time Calculation (min): 60 min   ? ?Hospital Problem: Principal Problem: ?  S/P BKA (below knee amputation), right ([place]) ? ? ?Past Medical History:  ?Past Medical History:  ?Diagnosis Date  ? CHF (congestive heart failure) ([place])   ? Chronic kidney disease   ? stage 3  ? Colon polyp   ? Coronary artery disease   ? Diabetes mellitus 1987  ? under care of Dr. Chalmers Cater.  On insulin since 96 (off and on)  ? Diabetic retinopathy   ? Dupuytren contracture   ? R hand, s/p injection (Dr. Lenon Curt)  ? Essential hypertension, benign   ? Essential hypertension, benign 02/06/2019  ? Frequency of urination and polyuria   ? Hypertension   ? Myocardial infarction [facility])   ? denies  ? Neuromuscular disorder ([place])   ? Diabetic neuropathy  ? Osteomyelitis ([place])   ? right foot  ? Other testicular hypofunction   ? Peripheral arterial disease ([place]) 10/28/2012  ? Peritoneal abscess ([place]) 6/08  ? and buttock.  ? Pneumonia   ? Polydipsia   ? Proteinuria   ? Pure hyperglyceridemia   ? Subacute osteomyelitis, right ankle and foot ([place])   ? Wears glasses   ? ?Past Surgical History:  ?Past Surgical History:  ?Procedure Laterality Date  ? ABDOMINAL AORTAGRAM N/A 04/18/2012  ? Procedure: ABDOMINAL AORTAGRAM;  Surgeon: Angelia Mould, MD;  Location: [facility] CATH LAB;  Service: Cardiovascular;  Laterality: N/A;  ? AMPUTATION Right 05/19/2019  ? Procedure: RIGHT FOOT 5TH RAY AMPUTATION;  Surgeon: Newt Minion, MD;  Location: [place];  Service: Orthopedics;  Laterality: Right;  ? AMPUTATION Right 03/11/2022  ? Procedure: RIGHT LEG DEBRIDEMENT VS. BELOW KNEE AMPUTATION;  Surgeon: Newt Minion, MD;  Location: [place];  Service: Orthopedics;  Laterality:  Right;  ? CARDIAC CATHETERIZATION N/A 09/08/2016  ? Procedure: Left Heart Cath and Coronary Angiography;  Surgeon: Adrian Prows, MD;  Location: [place] CV LAB;  Service: Cardiovascular;  Laterality: N/A;  ? CATARACT EXTRACTION, BILATERAL  09/2017, 10/2017  ? Dr. Herbert Deaner  ? COLONOSCOPY W/ BIOPSIES AND POLYPECTOMY    ? CORONARY/GRAFT ACUTE MI REVASCULARIZATION N/A 10/11/2020  ? Procedure: Coronary/Graft Acute MI Revascularization;  Surgeon: Adrian Prows, MD;  Location: [place] CV LAB;  Service: Cardiovascular;  Laterality: N/A;  ? I & D EXTREMITY Right 03/11/2022  ? Procedure: BELOW KNEE AMPUTATION;  Surgeon: Newt Minion, MD;  Location: [place];  Service: Orthopedics;  Laterality: Right;  ? LEFT HEART CATH N/A 12/31/2020  ? Procedure: Left Heart Cath;  Surgeon: Adrian Prows, MD;  Location: [place] CV LAB;  Service: Cardiovascular;  Laterality: N/A;  ? LEFT HEART CATH AND CORONARY ANGIOGRAPHY N/A 10/11/2020  ? Procedure: LEFT HEART CATH AND CORONARY ANGIOGRAPHY;  Surgeon: Adrian Prows, MD;  Location: [place] CV LAB;  Service: Cardiovascular;  Laterality: N/A;  ? LOWER EXTREMITY ANGIOGRAM Bilateral 04/18/2012  ? Procedure: LOWER EXTREMITY ANGIOGRAM;  Surgeon: Angelia Mould, MD;  Location: [facility] CATH LAB;  Service: Cardiovascular;  Laterality: Bilateral;  bilat lower extrem angio  ? LOWER EXTREMITY ANGIOGRAPHY Bilateral 05/02/2019  ? Procedure: LOWER EXTREMITY ANGIOGRAPHY;  Surgeon: Adrian Prows, MD;  Location: [place] CV LAB;  Service: Cardiovascular;  Laterality: Bilateral;  ? LOWER EXTREMITY ANGIOGRAPHY Bilateral 02/28/2019  ? Procedure: LOWER EXTREMITY ANGIOGRAPHY;  Surgeon: Adrian Prows, MD;  Location: [place] CV LAB;  Service: Cardiovascular;  Laterality: Bilateral;  ? macular photocoagulation    ? (eye treatments for diabetic retinopathy)-Dr. Zigmund Daniel  ? PERIPHERAL VASCULAR INTERVENTION  02/28/2019  ? Procedure: PERIPHERAL VASCULAR INTERVENTION;  Surgeon: Adrian Prows, MD;  Location: [place] CV LAB;   Service: Cardiovascular;;  ? TEE WITHOUT CARDIOVERSION N/A 03/18/2022  ? Procedure: TRANSESOPHAGEAL ECHOCARDIOGRAM (TEE);  Surgeon: Adrian Prows, MD;  Location: [place];  Service: Cardiovascular;  Laterality: N/A;  ? VENTRICULAR ASSIST DEVICE INSERTION N/A 12/31/2020  ? Procedure: VENTRICULAR ASSIST DEVICE INSERTION;  Surgeon: Adrian Prows, MD;  Location: [place] CV LAB;  Service: Cardiovascular;  Laterality: N/A;  ? ? ?Assessment & Plan ?Clinical Impression:  Eduardo [name] is a 66 year old male who presented to the emergency department on 03/08/2022 with complaints of fever, chills malaise and painful swollen right foot wound for several days.  He was hypotensive and required Levophed.  Admitted to the ICU on broad-spectrum antibiotics.  His past medical history significant for diabetes mellitus and peripheral arterial disease.  Orthopedic surgery consulted and noted exposed bone at the base of the fifth metatarsal and cellulitis extending to the peroneal tendons.  An MRI showed osteomyelitis and tenosynovitis.  He stabilized and was taken to the operating room on 5/3 for right below the knee amputation by Dr. Sharol Given.  Diuresis started on 5/5 for volume overload secondary to IV fluid resuscitation.  He was transitioned to oral diuretics.  Infectious disease consultation obtained and he underwent TTE.  Antibiotics were narrowed to penicillin.  No evidence of endocarditis.  Blood cultures were positive for Streptococcus anginosus.  Repeat blood cultures are negative.  He will complete 2-week course of antibiotics on 5/13.The patient requires inpatient physical medicine and rehabilitation evaluations and treatment secondary to dysfunction due to right below the knee amputation. ?  ?Patient's medical history is also significant for acute on chronic combined systolic and diastolic congestive heart failure.  His ejection fraction is estimated at 20 to 25%.  He will continue on home torsemide and Entresto. ?History of  bilateral Dupuytren hand contractions. ?  ?He reports some constipation and LBM about 2 days ago. Pain well controlled today. ?Ortho recommends Praveena plus wound vac for about a week week and then transition to stump shrinker.  ? ?Patient currently requires min with mobility secondary to muscle weakness and decreased standing balance and decreased balance strategies.  Prior to hospitalization, patient was independent  with mobility and lived with Significant other in a House home.  Home access is 1 4" lip outside to small platform before entering door.Stairs to enter. ? ?Patient will benefit from skilled PT intervention to maximize safe functional mobility, minimize fall risk, and decrease caregiver burden for planned discharge home with 24 hour supervision.  Anticipate patient will benefit from follow up [place] at discharge. ? ?PT - End of Session ?Activity Tolerance: Tolerates 30+ min activity with multiple rests ?Endurance Deficit: Yes ?PT Assessment ?Rehab Potential (ACUTE/IP ONLY): Good ?PT Barriers to Discharge: Weight bearing restrictions;Other (comments) ?PT Barriers to Discharge Comments: new BKA ?PT Patient demonstrates impairments in the following area(s): Balance;Endurance;Motor ?PT Transfers Functional Problem(s): Bed Mobility;Bed to Chair;Car;Furniture ?PT Locomotion Functional Problem(s): Ambulation;Wheelchair Mobility;Stairs ?PT Plan ?PT Intensity: Minimum of 1-2 x/day ,45 to 90 minutes ?PT Frequency: 5 out of 7 days ?PT Duration Estimated Length of Stay: 7-10 days. ?PT Treatment/Interventions: Ambulation/gait training;Discharge  planning;Functional mobility training;Therapeutic Activities;UE/LE Strength taining/ROM;Community reintegration;Neuromuscular re-education;Patient/family education;Stair training;Therapeutic Exercise;Wheelchair propulsion/positioning;UE/LE Coordination activities ?PT Transfers Anticipated Outcome(s): Mod I bed mobility, supervision transfers OOB ?PT Locomotion Anticipated  Outcome(s): supervision w/ RW ?PT Recommendation ?Recommendations for Other Services: Neuropsych consult ?Follow Up Recommendations: Home health PT ?Patient destination: Home ?Equipment Recommended: To be determined

## 2022-03-23 ENCOUNTER — Encounter (HOSPITAL_COMMUNITY): Payer: Self-pay | Admitting: Orthopedic Surgery

## 2022-03-23 LAB — COMPREHENSIVE METABOLIC PANEL
ALT: 35 U/L (ref 0–44)
AST: 27 U/L (ref 15–41)
Albumin: 2.6 g/dL — ABNORMAL LOW (ref 3.5–5.0)
Alkaline Phosphatase: 60 U/L (ref 38–126)
Anion gap: 7 (ref 5–15)
BUN: 31 mg/dL — ABNORMAL HIGH (ref 8–23)
CO2: 23 mmol/L (ref 22–32)
Calcium: 8.4 mg/dL — ABNORMAL LOW (ref 8.9–10.3)
Chloride: 106 mmol/L (ref 98–111)
Creatinine, Ser: 1.36 mg/dL — ABNORMAL HIGH (ref 0.61–1.24)
GFR, Estimated: 57 mL/min — ABNORMAL LOW (ref >60)
Glucose, Bld: 118 mg/dL — ABNORMAL HIGH (ref 70–99)
Potassium: 4.1 mmol/L (ref 3.5–5.1)
Sodium: 136 mmol/L (ref 135–145)
Total Bilirubin: 0.8 mg/dL (ref 0.3–1.2)
Total Protein: 6 g/dL — ABNORMAL LOW (ref 6.5–8.1)

## 2022-03-23 LAB — CBC WITH DIFFERENTIAL/PLATELET
Abs Immature Granulocytes: 0.02 10*3/uL (ref 0.00–0.07)
Basophils Absolute: 0.1 10*3/uL (ref 0.0–0.1)
Basophils Relative: 2 %
Eosinophils Absolute: 0.3 10*3/uL (ref 0.0–0.5)
Eosinophils Relative: 5 %
HCT: 37.5 % — ABNORMAL LOW (ref 39.0–52.0)
Hemoglobin: 12 g/dL — ABNORMAL LOW (ref 13.0–17.0)
Immature Granulocytes: 0 %
Lymphocytes Relative: 30 %
Lymphs Abs: 1.6 10*3/uL (ref 0.7–4.0)
MCH: 29.5 pg (ref 26.0–34.0)
MCHC: 32 g/dL (ref 30.0–36.0)
MCV: 92.1 fL (ref 80.0–100.0)
Monocytes Absolute: 0.4 10*3/uL (ref 0.1–1.0)
Monocytes Relative: 8 %
Neutro Abs: 3 10*3/uL (ref 1.7–7.7)
Neutrophils Relative %: 55 %
Platelets: 303 10*3/uL (ref 150–400)
RBC: 4.07 MIL/uL — ABNORMAL LOW (ref 4.22–5.81)
RDW: 14.5 % (ref 11.5–15.5)
WBC: 5.5 10*3/uL (ref 4.0–10.5)
nRBC: 0 % (ref 0.0–0.2)

## 2022-03-23 LAB — GLUCOSE, CAPILLARY
Glucose-Capillary: 188 mg/dL — ABNORMAL HIGH (ref 70–99)
Glucose-Capillary: 98 mg/dL (ref 70–99)
Glucose-Capillary: 173 mg/dL — ABNORMAL HIGH (ref 70–99)
Glucose-Capillary: 91 mg/dL (ref 70–99)
Glucose-Capillary: 193 mg/dL — ABNORMAL HIGH (ref 70–99)

## 2022-03-23 NOTE — IPOC Note (Addendum)
Overall Plan of Care [facility]) Patient Details Name: Eduardo [name] MRN: [id number] DOB: 1955/11/27  Admitting Diagnosis: S/P BKA (below knee amputation), right [facility])  Hospital Problems: Principal Problem:   S/P BKA (below knee amputation), right ([place])     Functional Problem List: Nursing Edema, Endurance, Medication Management, Motor, Pain, Safety, Sensory, Skin Integrity  PT Balance, Endurance, Motor  OT Balance, Safety, Motor, Endurance  SLP    TR         Basic ADL's: OT Dressing, Bathing, Toileting     Advanced  ADL's: OT Laundry, Simple Meal Preparation, Light Housekeeping     Transfers: PT Bed Mobility, Bed to Chair, Musician, Optometrist: PT Ambulation, Emergency planning/management officer, Stairs     Additional Impairments: OT    SLP        TR      Anticipated Outcomes Item Anticipated Outcome  Self Feeding Ind  Swallowing      Basic self-care  ModI  Toileting  ModI   Bathroom Transfers ModI  Bowel/Bladder  n/a  Transfers  Mod I bed mobility, supervision transfers OOB  Locomotion  supervision w/ RW  Communication     Cognition     Pain  < 3  Safety/Judgment  supervision   Therapy Plan: PT Intensity: Minimum of 1-2 x/day ,45 to 90 minutes PT Frequency: 5 out of 7 days PT Duration Estimated Length of Stay: 7-10 days. OT Intensity: Minimum of 1-2 x/day, 45 to 90 minutes OT Frequency: 5 out of 7 days OT Duration/Estimated Length of Stay: 7-10 days     Team Interventions: Nursing Interventions Patient/Family Education, Disease Management/Prevention, Pain Management, Medication Management, Skin Care/Wound Management, Discharge Planning, Psychosocial Support  PT interventions Ambulation/gait training, Discharge planning, Functional mobility training, Therapeutic Activities, UE/LE Strength taining/ROM, Community reintegration, Neuromuscular re-education, Patient/family education, Stair training, Therapeutic Exercise, Wheelchair  propulsion/positioning, UE/LE Coordination activities  OT Interventions Training and development officer, Neuromuscular re-education, Therapeutic Exercise, DME/adaptive equipment instruction, UE/LE Strength taining/ROM, UE/LE Coordination activities, Functional mobility training, Therapeutic Activities  SLP Interventions    TR Interventions    SW/CM Interventions Discharge Planning, Psychosocial Support, Patient/Family Education   Barriers to Discharge MD  Medical stability, Wound care, and Weight  Nursing Decreased caregiver support, Home environment access/layout, Wound Care, Lack of/limited family support, Weight, Weight bearing restrictions 1 level, 1 step, no rail. S/O to provide supervison assist, 24/7.  PT Weight bearing restrictions, Other (comments) new [place] for SNF coverage     Team Discharge Planning: Destination: PT-Home ,OT- Home , SLP-  Projected Follow-up: PT-Home health PT, OT-  None, SLP-  Projected Equipment Needs: PT-To be determined, OT- Rolling walker with 5" wheels, 3 in 1 bedside comode, SLP-  Equipment Details: PT-Pt has RW, rollator, BSC, built-in shower chair, hand held shower head and grab bar for shower., OT-  Patient/family involved in discharge planning: PT- Patient,  OT-Patient, SLP-   MD ELOS: 7-10d Medical Rehab Prognosis:  Good Assessment: The patient has been admitted for CIR therapies with the diagnosis of Right BKA. The team will be addressing functional mobility, strength, stamina, balance, safety, adaptive techniques and equipment, self-care, bowel and bladder mgt, patient and caregiver education, Stump edema management and pain. Goals have been set at Mod I. Anticipated discharge destination is home.        See Team Conference Notes for weekly updates to the plan of care

## 2022-03-23 NOTE — Progress Notes (Signed)
Physical Therapy Session Note ? ?Patient Details  ?Name: Eduardo [name] ?MRN: [id number] ?Date of Birth: [birth year] ? ?Today's Date: 03/23/2022 ?PT Individual Time: 7026-3785 ?PT Individual Time Calculation (min): 78 min  ? ?Short Term Goals: ?Week 1:  PT Short Term Goal 1 (Week 1): =LTG due to ELOS. ? ?Skilled Therapeutic Interventions/Progress Updates:  ?Patient seated upright with BLE elevated  in recliner on entrance to room. Patient alert and agreeable to PT session. Pt initially bummed re: inability to return to fishing from kayak. Pt educated that he will be able to return to most  hobbies with minor adjustments to how he will perform. Educated to always use desire of return to hobbies/ sport/ mobility as goals in order to promote progress and provide self-encouragement/ motivation.  ? ?Patient with no pain complaint throughout session. ? ?Is able to don limb protector with IND/ supervision.  ? ?Therapeutic Activity: ?Transfers: Patient performed sit<>stand and stand pivot transfers throughout session with CGA and improving to close supervision. Provided verbal cues for technique/ positioning. ? ?Wheelchair Mobility:  ?Patient propelled wheelchair out of room and covering 130 feet with supervision. Pt demos good understanding of proper technique to propel at good speed and with careful maneuvering corners. Pt provided with info re: wc pivot point, turn in place.  Pt guided in maneuver of chair through cones spaced 5 feet apart with one touch of cone, then progressed to 4 feet apart with cueing for awareness of pivot point and to prevent moving cones. ? ?Gait Training/ NMR:  ?Patient ambulated 12 ft in room, 10 ft x3 to stand pivot practice to circle of armchairs in order to practice controlled rise and descent from lighter furniture. NMR training to improve balance and controll to prevent move of furniture. Good progress during training. Lateral stepping covering 48f in each direction after pt relates a few  potiential tight spaces in home. Completes with CGA. Provided with education to lighten landing to L foot from flat foot/ heel landing to soft toe landing to prevent overuse injury.  ? ?NMR performed for improvements in motor control and coordination, balance, sequencing, judgement, and self confidence/ efficacy in performing all aspects of mobility at highest level of independence.  ? ?Patient seated upright with BLE elevated  in recliner at end of session with brakes locked, no alarm set, and all needs within reach. Disc with NT re: pt's high level of awareness and lack of desire to attempt moving about room without supervision/ assist when needed.  ? ? ? ?Therapy Documentation ?Precautions:  ?Precautions ?Precautions: Fall (R BKA) ?Precaution Comments: R BKA ?Required Braces or Orthoses: Other Brace ?Other Brace: limb protector ?Restrictions ?Weight Bearing Restrictions: Yes ?RLE Weight Bearing: Non weight bearing ?General: ?  ?Vital Signs: ?Therapy Vitals ?Temp: (!) 97.5 ?F (36.4 ?C) ?Temp Source: Oral ?Pulse Rate: 61 ?Resp: 18 ?BP: (!) 101/55 ?Patient Position (if appropriate): Lying ?Oxygen Therapy ?SpO2: 96 % ?O2 Device: Room Air ?Pain: ? No pain related this session.  ? ?Therapy/Group: Individual Therapy ? ?JAlger SimonsPT, DPT ?03/23/2022, 8:01 AM  ?

## 2022-03-23 NOTE — Progress Notes (Signed)
?                                                       PROGRESS NOTE ? ? ?Subjective/Complaints: ? ?No issues overnite , pain controlled , labs reviewed stable  ? ?ROS No fever, chills, CP, SOB, nausea, bowel  ? ?Objective: ?  ?No results found. ?Recent Labs  ?  03/21/22 ?0427 03/23/22 ?3762  ?WBC 5.2 5.5  ?HGB 12.0* 12.0*  ?HCT 37.1* 37.5*  ?PLT 277 303  ? ? ?Recent Labs  ?  03/21/22 ?0427 03/23/22 ?8315  ?NA 135 136  ?K 3.9 4.1  ?CL 102 106  ?CO2 26 23  ?GLUCOSE 181* 118*  ?BUN 31* 31*  ?CREATININE 1.42* 1.36*  ?CALCIUM 8.2* 8.4*  ? ? ? ?Intake/Output Summary (Last 24 hours) at 03/23/2022 1024 ?Last data filed at 03/23/2022 0800 ?Gross per 24 hour  ?Intake 1032 ml  ?Output --  ?Net 1032 ml  ? ?  ? ?  ? ?Physical Exam: ?Vital Signs ?Blood pressure (!) 101/55, pulse 61, temperature (!) 97.5 ?F (36.4 ?C), temperature source Oral, resp. rate 18, height '5\' 10"'$  (1.778 m), weight 119.1 kg, SpO2 96 %. ? ? ?General: No acute distress ?Mood and affect are appropriate ?Heart: Regular rate and rhythm no rubs murmurs or extra sounds ?Lungs: Clear to auscultation, breathing unlabored, no rales or wheezes ?Abdomen: Positive bowel sounds, soft nontender to palpation, nondistended ?Extremities: No clubbing, cyanosis, or edema ?Skin: No evidence of breakdown, no evidence of rash ? ? ?Neuro:  Alert and oriented , follows commands, insight normal, memory intact, speech normal ?Sensation intact to LT b/l UE, decreased sensation in RLE. CN2-12 grossly intact ?Musculoskeletal: R BKA with wound vac and shrinker ?Strength 5/5 in b/L UE and RLE ?Bruising on b/l arms ?Dupuytren contractures b/l hands ? ?Assessment/Plan: ?1. Functional deficits which require 3+ hours per day of interdisciplinary therapy in a comprehensive inpatient rehab setting. ?Physiatrist is providing close team supervision and 24 hour management of active medical problems listed below. ?Physiatrist and rehab team continue to assess barriers to discharge/monitor patient  progress toward functional and medical goals ? ?Care Tool: ? ?Bathing ?   ?Body parts bathed by patient: Right arm, Left arm, Chest, Abdomen, Front perineal area, Right upper leg, Left upper leg, Right lower leg, Left lower leg, Face, Buttocks  ?   ?  ?  ?Bathing assist Assist Level: Minimal Assistance - Patient > 75% (LE) ?  ?  ?Upper Body Dressing/Undressing ?Upper body dressing   ?What is the patient wearing?: [place] only ?   ?Upper body assist Assist Level: Minimal Assistance - Patient > 75% ?   ?Lower Body Dressing/Undressing ?Lower body dressing ? ? ?   ?What is the patient wearing?: Pants ? ?  ? ?Lower body assist Assist for lower body dressing: Minimal Assistance - Patient > 75% ?   ? ?Toileting ?Toileting    ?Toileting assist Assist for toileting: Minimal Assistance - Patient > 75% ?  ?  ?Transfers ?Chair/bed transfer ? ?Transfers assist ?   ? ?Chair/bed transfer assist level: Minimal Assistance - Patient > 75% ?  ?  ?Locomotion ?Ambulation ? ? ?Ambulation assist ? ?   ? ?Assist level: Minimal Assistance - Patient > 75% ?Assistive device: Walker-rolling ?Max distance: 15  ? ?Walk 10 feet activity ? ? ?  Assist ?   ? ?Assist level: Minimal Assistance - Patient > 75% ?Assistive device: Walker-rolling  ? ?Walk 50 feet activity ? ? ?Assist Walk 50 feet with 2 turns activity did not occur: Safety/medical concerns ? ?  ?   ? ? ?Walk 150 feet activity ? ? ?Assist Walk 150 feet activity did not occur: Safety/medical concerns ? ?  ?  ?  ? ?Walk 10 feet on uneven surface  ?activity ? ? ?Assist Walk 10 feet on uneven surfaces activity did not occur: Safety/medical concerns ? ? ?  ?   ? ?Wheelchair ? ? ? ? ?Assist Is the patient using a wheelchair?: Yes ?Type of Wheelchair: Manual ?  ? ?Wheelchair assist level: Supervision/Verbal cueing ?Max wheelchair distance: 150  ? ? ?Wheelchair 50 feet with 2 turns activity ? ? ? ?Assist ? ?  ?  ? ? ?Assist Level: Supervision/Verbal cueing  ? ?Wheelchair 150 feet activity   ? ? ? ?Assist ?   ? ? ?Assist Level: Supervision/Verbal cueing  ? ?Blood pressure (!) 101/55, pulse 61, temperature (!) 97.5 ?F (36.4 ?C), temperature source Oral, resp. rate 18, height '5\' 10"'$  (1.778 m), weight 119.1 kg, SpO2 96 %. ? ?  ?Medical Problem List and Plan: ?1. 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           - He had strep bacteremia and septic shock with diabetic foot infection on omoxicillin ?            -patient may not shower due to wound vac ?            -ELOS/Goals: 9-12 days mod I to supervision with PT/OT ? -PT/OT evaluations today ?           -Wound vac for about 1 week ?2.  Antithrombotics: ?-DVT/anticoagulation:  Pharmaceutical: Lovenox 60 mg daily ?            -antiplatelet therapy: Plavix and aspirin ?3. Pain Management: Tylenol, oxycodone, tramadol as needed ?           -Well controlled ?4. Mood: LCSW to evaluate and provide emotional support ?            -antipsychotic agents: n/a ?5. Neuropsych: This patient is capable of making decisions on his own behalf. ?6. Skin/Wound Care: Routine skin care checks ?7. Fluids/Electrolytes/Nutrition: Routine Is and Os and follow-up chemistries ?Need to monitor Na+ on sacubatril and  ? ?8: Diabetes mellitus: CBGs 4 times daily ?-- 5/14 CGB a little elevated Semglee 40 units daily, will increase to 42 units ?            -- Continue meal coverage NovoLog 3 units with meals ?-- Continue sliding scale log with meals and bedtime ?--at home on weekly Mounjara; stopped Jardiance in February ?CBG (last 3)  ?Recent Labs  ?  03/22/22 ?1708 03/22/22 ?2101 03/23/22 ?6226  ?GLUCAP 122* 103* 98  ? ?Controlled 5/15 ?9: Heart failure: Daily weight ?            -- Continue torsemide 20 mg twice daily ?            -- Continue Entresto 24/26 mg twice daily ?10: Hyperlipidemia: Continue Crestor, Lopid, Zetia, discussed healthy diet ?11: Peripheral arterial disease: Continue Plavix, aspirin, statin ?12: AKI atop chronic kidney disease: Serum  creatinine continues downward trend and appears at baseline.   ?-Follow up BMP Monday ?13: Coronary artery disease: Stable; continue heart meds, antiplatelet meds, antihyperlipidemia medications.  Jardiance, Imdur and Coreg currently held ?14: Constipation: Continue senokot, miralax ?15: Morbid obesity: dietary counseling, carb modified diet ?16: Essential hypertension: monitor TID/as needed.  Coreg and Jardiance currently held ? -Bps on the lower side for several days, overall stable and continue to monitor ?Vitals:  ? 03/22/22 1937 03/23/22 0417  ?BP: (!) 100/49 (!) 101/55  ?Pulse: 70 61  ?Resp: 18 18  ?Temp: (!) 97.5 ?F (36.4 ?C) (!) 97.5 ?F (36.4 ?C)  ?SpO2: 93% 96%  ?On entresto and demadex monitor for dizziness  ? ?17. Acute blood loss anemia ?           -Follow H/H  ?18. Insomnia  ? -Start melatonin ? ?LOS: ?2 days ?A FACE TO FACE EVALUATION WAS PERFORMED ? ?Luanna Salk Ondrea Dow ?03/23/2022, 10:24 AM  ? ?  ?

## 2022-03-23 NOTE — Progress Notes (Signed)
Inpatient Rehabilitation Care Coordinator ?Assessment and Plan ?Patient Details  ?Name: Eduardo [name] ?MRN: [id number] ?Date of Birth: July 30, 1956 ? ?Today's Date: 03/23/2022 ? ?Hospital Problems: Principal Problem: ?  S/P BKA (below knee amputation), right ([place]) ? ?Past Medical History:  ?Past Medical History:  ?Diagnosis Date  ? CHF (congestive heart failure) ([place])   ? Chronic kidney disease   ? stage 3  ? Colon polyp   ? Coronary artery disease   ? Diabetes mellitus 1987  ? under care of Dr. Chalmers Cater.  On insulin since 96 (off and on)  ? Diabetic retinopathy   ? Dupuytren contracture   ? R hand, s/p injection (Dr. Lenon Curt)  ? Essential hypertension, benign   ? Essential hypertension, benign 02/06/2019  ? Frequency of urination and polyuria   ? Hypertension   ? Myocardial infarction [facility])   ? denies  ? Neuromuscular disorder ([place])   ? Diabetic neuropathy  ? Osteomyelitis ([place])   ? right foot  ? Other testicular hypofunction   ? Peripheral arterial disease ([place]) 10/28/2012  ? Peritoneal abscess ([place]) 6/08  ? and buttock.  ? Pneumonia   ? Polydipsia   ? Proteinuria   ? Pure hyperglyceridemia   ? Subacute osteomyelitis, right ankle and foot ([place])   ? Wears glasses   ? ?Past Surgical History:  ?Past Surgical History:  ?Procedure Laterality Date  ? ABDOMINAL AORTAGRAM N/A 04/18/2012  ? Procedure: ABDOMINAL AORTAGRAM;  Surgeon: Angelia Mould, MD;  Location: [facility] CATH LAB;  Service: Cardiovascular;  Laterality: N/A;  ? AMPUTATION Right 05/19/2019  ? Procedure: RIGHT FOOT 5TH RAY AMPUTATION;  Surgeon: Newt Minion, MD;  Location: [place];  Service: Orthopedics;  Laterality: Right;  ? AMPUTATION Right 03/11/2022  ? Procedure: RIGHT LEG DEBRIDEMENT VS. BELOW KNEE AMPUTATION;  Surgeon: Newt Minion, MD;  Location: [place];  Service: Orthopedics;  Laterality: Right;  ? CARDIAC CATHETERIZATION N/A 09/08/2016  ? Procedure: Left Heart Cath and Coronary Angiography;  Surgeon: Adrian Prows, MD;  Location: [place] CV LAB;  Service:  Cardiovascular;  Laterality: N/A;  ? CATARACT EXTRACTION, BILATERAL  09/2017, 10/2017  ? Dr. Herbert Deaner  ? COLONOSCOPY W/ BIOPSIES AND POLYPECTOMY    ? CORONARY/GRAFT ACUTE MI REVASCULARIZATION N/A 10/11/2020  ? Procedure: Coronary/Graft Acute MI Revascularization;  Surgeon: Adrian Prows, MD;  Location: [place] CV LAB;  Service: Cardiovascular;  Laterality: N/A;  ? I & D EXTREMITY Right 03/11/2022  ? Procedure: BELOW KNEE AMPUTATION;  Surgeon: Newt Minion, MD;  Location: [place];  Service: Orthopedics;  Laterality: Right;  ? LEFT HEART CATH N/A 12/31/2020  ? Procedure: Left Heart Cath;  Surgeon: Adrian Prows, MD;  Location: [place] CV LAB;  Service: Cardiovascular;  Laterality: N/A;  ? LEFT HEART CATH AND CORONARY ANGIOGRAPHY N/A 10/11/2020  ? Procedure: LEFT HEART CATH AND CORONARY ANGIOGRAPHY;  Surgeon: Adrian Prows, MD;  Location: [place] CV LAB;  Service: Cardiovascular;  Laterality: N/A;  ? LOWER EXTREMITY ANGIOGRAM Bilateral 04/18/2012  ? Procedure: LOWER EXTREMITY ANGIOGRAM;  Surgeon: Angelia Mould, MD;  Location: [facility] CATH LAB;  Service: Cardiovascular;  Laterality: Bilateral;  bilat lower extrem angio  ? LOWER EXTREMITY ANGIOGRAPHY Bilateral 05/02/2019  ? Procedure: LOWER EXTREMITY ANGIOGRAPHY;  Surgeon: Adrian Prows, MD;  Location: [place] CV LAB;  Service: Cardiovascular;  Laterality: Bilateral;  ? LOWER EXTREMITY ANGIOGRAPHY Bilateral 02/28/2019  ? Procedure: LOWER EXTREMITY ANGIOGRAPHY;  Surgeon: Adrian Prows, MD;  Location: [place] CV LAB;  Service: Cardiovascular;  Laterality:  Bilateral;  ? macular photocoagulation    ? (eye treatments for diabetic retinopathy)-Dr. Zigmund Daniel  ? PERIPHERAL VASCULAR INTERVENTION  02/28/2019  ? Procedure: PERIPHERAL VASCULAR INTERVENTION;  Surgeon: Adrian Prows, MD;  Location: [place] CV LAB;  Service: Cardiovascular;;  ? TEE WITHOUT CARDIOVERSION N/A 03/18/2022  ? Procedure: TRANSESOPHAGEAL ECHOCARDIOGRAM (TEE);  Surgeon: Adrian Prows, MD;  Location: [place];   Service: Cardiovascular;  Laterality: N/A;  ? VENTRICULAR ASSIST DEVICE INSERTION N/A 12/31/2020  ? Procedure: VENTRICULAR ASSIST DEVICE INSERTION;  Surgeon: Adrian Prows, MD;  Location: [place] CV LAB;  Service: Cardiovascular;  Laterality: N/A;  ? ?Social History:  reports that he quit smoking about 10 years ago. His smoking use included cigarettes. He has a 30.00 pack-year smoking history. He has never used smokeless tobacco. He reports that he does not currently use alcohol. He reports that he does not use drugs. ? ?Family / Support Systems ?Marital Status: Widow/Widower ?Patient Roles: Partner, Other (Comment) (friend) ?Spouse/Significant Other: Erline Levine [phone] ?Other Supports: Ruth-sister in-law [phone] ?Anticipated Caregiver: Erline Levine ?Ability/Limitations of Caregiver: Supervision ?Caregiver Availability: 24/7 ?Family Dynamics: Close with Marzetta Board and her daughter. He has a sister in-law who checks on him and friends. Pt feels he has good supports via friends and family ? ?Social History ?Preferred language: English ?Religion: None ?Cultural Background: No issues ?Education: College ?Health Literacy - How often do you need to have someone help you when you read instructions, pamphlets, or other written material from your doctor or pharmacy?: Never ?Writes: Yes ?Employment Status: Retired ?Legal History/Current Legal Issues: No issues ?Guardian/Conservator: None-according to MD pt is capable of making his own decisions while here  ? ?Abuse/Neglect ?Abuse/Neglect Assessment Can Be Completed: Yes ?Physical Abuse: Denies ?Verbal Abuse: Denies ?Sexual Abuse: Denies ?Exploitation of patient/patient's resources: Denies ?Self-Neglect: Denies ? ?Patient response to: ?Social Isolation - How often do you feel lonely or isolated from those around you?: Never ? ?Emotional Status ?Pt's affect, behavior and adjustment status: Pt is motivated to do well and recover from his amputation. He has always been independent and  able to take care of himself. He feels he will again once he is healed and can get his prothesis ?Recent Psychosocial Issues: other health issues but managed by MD ?Psychiatric History: No history he seems to be coping appropriately and adjusting to his loss of his leg. He feels they tried but his leg could not saved and it is better now to begin the healing process ?Substance Abuse History: No issues ? ?Patient / Family Perceptions, Expectations & Goals ?Pt/Family understanding of illness & functional limitations: Pt is able to explain his amputation and the process it will take to heal and move foreward. He does talk with the MD and feels his questions and concerns have been addressed. ?Premorbid pt/family roles/activities: partner, friend, retiree, brother in-law etc ?Anticipated changes in roles/activities/participation: resume ?Pt/family expectations/goals: Pt states: " I want to be able to move myself around when I leave here." ? ?Community Resources ?Community Agencies: Other (Comment) (Wound Clinic PTA for leg wound) ?Premorbid Home Care/DME Agencies: Other (Comment) (has rollator, bsc, short rw from deceased wife) ?Transportation available at discharge: Self and Erline Levine ?Is the patient able to respond to transportation needs?: Yes ?In the past 12 months, has lack of transportation kept you from medical appointments or from getting medications?: No ?In the past 12 months, has lack of transportation kept you from meetings, work, or from getting things needed for daily living?: No ? ?Discharge Planning ?Living Arrangements: Spouse/significant other ?Support  Systems: Spouse/significant other, Other relatives, Friends/neighbors ?Type of Residence: Private residence ?Insurance Resources: Multimedia programmer (specify) ([employer]-Medicare) ?Financial Resources: Social Security ?Financial Screen Referred: No ?Living Expenses: Own ?Money Management: Patient ?Does the patient have any problems obtaining your medications?:  No ?Home Management: Both he and Erline Levine ?Patient/Family Preliminary Plans: Return home with Hayden who will be there and be able to assist with light assist. He hopefully will be ambulating short distances and

## 2022-03-23 NOTE — Progress Notes (Signed)
Inpatient Rehabilitation  Patient information reviewed and entered into eRehab system by Michaela Broski M. Jonan Seufert, M.A., CCC/SLP, PPS Coordinator.  Information including medical coding, functional ability and quality indicators will be reviewed and updated through discharge.    

## 2022-03-23 NOTE — Progress Notes (Signed)
Occupational Therapy Session Note ? ?Patient Details  ?Name: ZAKKARY THIBAULT ?MRN: [id number] ?Date of Birth: 20-Nov-1955 ? ?Today's Date: 03/23/2022 ?OT Individual Time: 2197-5883 ?OT Individual Time Calculation (min): 43 min  ? ? ?Short Term Goals: ?Week 1:    ? ?Skilled Therapeutic Interventions/Progress Updates:  ?  Pt seated EOM upon arrival. Pt just completed PT session. OT intervention with focus on bed mobility, demonstrating walk-in shower transfers, stand pivot transfers, functional amb with RW, and discharge planning. Supine>sit EOM (at estimated height of bed at home) with supervision. Pt dons/doffs limb guard with supervision. Stand pivot transfer with CGA. Functional amb with RW in room with CGA to access recliner beside bed. Pt already owns wide BSC. Pt does not currently have grab bars in shower but will have them installed. Pt does own suction grab bar that has been used. Reviewed safety with grab bars. Demonstrated transfer method. Pt returned to room and remained in recliner with all needs within reach. Home measurement sheet provided. ? ?Therapy Documentation ?Precautions:  ?Precautions ?Precautions: Fall (R BKA) ?Precaution Comments: R BKA ?Required Braces or Orthoses: Other Brace ?Other Brace: limb protector ?Restrictions ?Weight Bearing Restrictions: Yes ?RLE Weight Bearing: Non weight bearing ? ?Pain: ?Pt c/o 1/10 RLE pain; repositioned at end of session ? ?Therapy/Group: Individual Therapy ? ?Leroy Libman ?03/23/2022, 2:32 PM ?

## 2022-03-23 NOTE — Progress Notes (Signed)
PMR Admission Coordinator Pre-Admission Assessment ?  ?Patient: Eduardo [name] is an 66 y.o., male ?MRN: [id number] ?DOB: Mar 21, 1956 ?Height: '5\' 9"'$  (175.3 cm) ?Weight: 117.4 kg ?  ?Insurance Information ?HMO:     PPO: yes     PCP:      IPA:      80/20:      OTHER:  ?PRIMARY: UHC Medicare      Policy#: [id number]      Subscriber: pt ?CM Name: Linna Hoff      Phone#: [phone]     Fax#: [phone] ?Pre-Cert#: [id number] auth for CIR from Dan with [facility] Medicare expedited appeals dept with updates due to fax listed above on 5/18      Employer:  ?Benefits:  Phone #: [phone]     Name:  ?Eff. Date: 12/10/21     Deduct: $0      Out of Pocket Max: $3900 ($73.80)      Life Max: n/a ?CIR: $295/day for days 1-5      SNF: 20 full days ?Outpatient:      Co-Pay: $20/visit ?Home Health: 100%      Co-Pay:  ?DME: 80%     Co-Ins: 20% ?Providers:  ?SECONDARY:       Policy#:      Phone#:  ?  ?Financial Counselor:       Phone#:  ?  ?The ?Data Collection Information Summary? for patients in Inpatient Rehabilitation Facilities with attached ?Privacy Act [place] Records? was provided and verbally reviewed with: Patient ?  ?Emergency Contact Information ?Contact Information   ?  ?  Name Relation Home Work Mobile  ?  McDermott,Stacey Friend     [phone]  ?  Robenoit,Ruth Sister     [phone]  ?  ?   ?  ?  ?Current Medical History  ?Patient Admitting Diagnosis: BKA ?  ?History of Present Illness: Pt is a 66 y/o male with PMH of CAD s/p CABG x3 in 2017, HTN, HLD, DM with partial R foot amputation per Dr. Sharol Given, PVD with femoral artery stenting in 2020 and on Plavix.  He admitted to [facility] on 03/08/22 with c/o dyspnea, and intermittent fevers/weakness.  He was found to be in septic shock due to R foot osteomyelitis, started on IV antibiotics and requiring levophed and ICU admission.  Dr. Sharol Given consulted and pt presented with exposed necrotic bone, necrotic ulcer over the base of the 5th metatarsal and cellulitis extending up  the peritoneal tendons.  He underwent a BKA with application of prevena wound VAC per Dr. Sharol Given on 5/3.  Post op course complicated by hypotension, pulmonary edema, and ABLA.  Pt weaned from pressor support on 5/4, required IV lasix for fluid management (now resolved).  Therapy ongoing and pt was recommended for CIR.  ?  ?Patient's medical record from [facility] has been reviewed by the rehabilitation admission coordinator and physician. ?  ?Past Medical History  ?    ?Past Medical History:  ?Diagnosis Date  ? CHF (congestive heart failure) ([place])    ? Chronic kidney disease    ?  stage 3  ? Colon polyp    ? Coronary artery disease    ? Diabetes mellitus 1987  ?  under care of Dr. Chalmers Cater.  On insulin since 96 (off and on)  ? Diabetic retinopathy    ? Dupuytren contracture    ?  R hand, s/p injection (Dr. Lenon Curt)  ? Essential hypertension, benign    ? Essential hypertension, benign 02/06/2019  ?  Frequency of urination and polyuria    ? Hypertension    ? Myocardial infarction [facility])    ?  denies  ? Neuromuscular disorder ([place])    ?  Diabetic neuropathy  ? Osteomyelitis ([place])    ?  right foot  ? Other testicular hypofunction    ? Peripheral arterial disease ([place]) 10/28/2012  ? Peritoneal abscess ([place]) 6/08  ?  and buttock.  ? Pneumonia    ? Polydipsia    ? Proteinuria    ? Pure hyperglyceridemia    ? Subacute osteomyelitis, right ankle and foot ([place])    ? Wears glasses    ?  ?  ?Has the patient had major surgery during 100 days prior to admission? Yes ?  ?Family History   ?family history includes Dementia in his father; Diabetes in his maternal grandmother and mother; Hearing loss in his mother; Heart disease in his mother; Hyperlipidemia in his brother and mother; Hypertension in his mother; Varicose Veins in his father and mother. ?  ?Current Medications ?  ?Current Facility-Administered Medications:  ?  0.9 %  sodium chloride infusion, , Intravenous, PRN, Adrian Prows, MD, Last Rate: 10 mL/hr at 03/15/22 1154, New Bag at  03/15/22 1154 ?  acetaminophen (TYLENOL) tablet 650 mg, 650 mg, Oral, Q8H, Adrian Prows, MD, 650 mg at 03/20/22 0645 ?  acidophilus (RISAQUAD) capsule 1 capsule, 1 capsule, Oral, Daily, Adrian Prows, MD, 1 capsule at 03/20/22 7741 ?  alum & mag hydroxide-simeth (MAALOX/MYLANTA) 200-200-20 MG/5ML suspension 15-30 mL, 15-30 mL, Oral, Q2H PRN, Adrian Prows, MD ?  amoxicillin (AMOXIL) capsule 1,000 mg, 1,000 mg, Oral, Q8H, Manandhar, Sabina, MD, 1,000 mg at 03/20/22 0645 ?  ascorbic acid (VITAMIN C) tablet 1,000 mg, 1,000 mg, Oral, Daily, Adrian Prows, MD, 1,000 mg at 03/20/22 2878 ?  aspirin EC tablet 81 mg, 81 mg, Oral, Daily, Adrian Prows, MD, 81 mg at 03/20/22 6767 ?  bisacodyl (DULCOLAX) suppository 10 mg, 10 mg, Rectal, Once, Domenic Polite, MD ?  calcium carbonate (TUMS - dosed in mg elemental calcium) chewable tablet 200 mg of elemental calcium, 1 tablet, Oral, TID PRN, Adrian Prows, MD, 200 mg of elemental calcium at 03/16/22 0803 ?  cholecalciferol (VITAMIN D3) tablet 2,000 Units, 2,000 Units, Oral, Daily, Adrian Prows, MD, 2,000 Units at 03/20/22 2094 ?  clopidogrel (PLAVIX) tablet 75 mg, 75 mg, Oral, Daily, Adrian Prows, MD, 75 mg at 03/20/22 0929 ?  dextrose 50 % solution 0-50 mL, 0-50 mL, Intravenous, PRN, Adrian Prows, MD ?  enoxaparin (LOVENOX) injection 60 mg, 60 mg, Subcutaneous, Q24H, Adrian Prows, MD, 60 mg at 03/19/22 2153 ?  ezetimibe (ZETIA) tablet 10 mg, 10 mg, Oral, Daily, Adrian Prows, MD, 10 mg at 03/20/22 7096 ?  gemfibrozil (LOPID) tablet 600 mg, 600 mg, Oral, BID AC, Adrian Prows, MD, 600 mg at 03/20/22 2836 ?  guaiFENesin-dextromethorphan (ROBITUSSIN DM) 100-10 MG/5ML syrup 15 mL, 15 mL, Oral, Q4H PRN, Adrian Prows, MD ?  hydrALAZINE (APRESOLINE) injection 5 mg, 5 mg, Intravenous, Q20 Min PRN, Adrian Prows, MD ?  HYDROmorphone (DILAUDID) injection 0.5-1 mg, 0.5-1 mg, Intravenous, Q4H PRN, Adrian Prows, MD ?  insulin aspart (novoLOG) injection 0-20 Units, 0-20 Units, Subcutaneous, TID WC, Adrian Prows, MD, 3 Units at  03/20/22 0645 ?  insulin aspart (novoLOG) injection 0-5 Units, 0-5 Units, Subcutaneous, QHS, Adrian Prows, MD, 3 Units at 03/17/22 2112 ?  insulin aspart (novoLOG) injection 3 Units, 3 Units, Subcutaneous, TID WC, Adrian Prows, MD, 3 Units at 03/20/22 0800 ?  insulin glargine-yfgn (SEMGLEE) injection 40 Units, 40 Units, Subcutaneous, Daily, Adrian Prows, MD, 40 Units at 03/20/22 0981 ?  labetalol (NORMODYNE) injection 10 mg, 10 mg, Intravenous, Q10 min PRN, Adrian Prows, MD ?  magnesium sulfate IVPB 2 g 50 mL, 2 g, Intravenous, Daily PRN, Adrian Prows, MD ?  MEDLINE mouth rinse, 15 mL, Mouth Rinse, BID, Adrian Prows, MD, 15 mL at 03/20/22 0930 ?  metoprolol tartrate (LOPRESSOR) injection 2-5 mg, 2-5 mg, Intravenous, Q2H PRN, Adrian Prows, MD ?  multivitamin with minerals tablet 1 tablet, 1 tablet, Oral, Daily, Adrian Prows, MD, 1 tablet at 03/20/22 1914 ?  nutrition supplement (JUVEN) (JUVEN) powder packet 1 packet, 1 packet, Oral, BID BM, Adrian Prows, MD, 1 packet at 03/20/22 0930 ?  ondansetron (ZOFRAN) injection 4 mg, 4 mg, Intravenous, Q6H PRN, Adrian Prows, MD ?  ondansetron [facility]) injection 4 mg, 4 mg, Intravenous, Q6H PRN, Adrian Prows, MD ?  oxyCODONE (Oxy IR/ROXICODONE) immediate release tablet 5-10 mg, 5-10 mg, Oral, Q4H PRN, Adrian Prows, MD, 10 mg at 03/20/22 0943 ?  pantoprazole (PROTONIX) EC tablet 40 mg, 40 mg, Oral, Daily, Adrian Prows, MD, 40 mg at 03/20/22 0930 ?  polyethylene glycol (MIRALAX / GLYCOLAX) packet 17 g, 17 g, Oral, BID, Domenic Polite, MD, 17 g at 03/20/22 0930 ?  potassium chloride SA (KLOR-CON M) CR tablet 20-40 mEq, 20-40 mEq, Oral, Daily PRN, Adrian Prows, MD ?  rosuvastatin (CRESTOR) tablet 20 mg, 20 mg, Oral, Daily, Adrian Prows, MD, 20 mg at 03/20/22 0930 ?  sacubitril-valsartan (ENTRESTO) 24-26 mg per tablet, 1 tablet, Oral, BID, Adrian Prows, MD, 1 tablet at 03/20/22 0930 ?  senna-docusate (Senokot-S) tablet 1 tablet, 1 tablet, Oral, BID, Domenic Polite, MD, 1 tablet at 03/20/22 7829 ?  senna-docusate  (Senokot-S) tablet 2 tablet, 2 tablet, Oral, BID PRN, Adrian Prows, MD, 2 tablet at 03/17/22 0908 ?  torsemide (DEMADEX) tablet 20 mg, 20 mg, Oral, BID, Adrian Prows, MD, 20 mg at 03/20/22 0930 ?  traMADol (ULTR

## 2022-03-23 NOTE — Progress Notes (Signed)
Inpatient Rehabilitation Center ?Individual Statement of Services ? ?Patient Name:  Eduardo [name]  ?Date:  03/23/2022 ? ?Welcome to the [facility].  Our goal is to provide you with an individualized program based on your diagnosis and situation, designed to meet your specific needs.  With this comprehensive rehabilitation program, you will be expected to participate in at least 3 hours of rehabilitation therapies Monday-Friday, with modified therapy programming on the weekends. ? ?Your rehabilitation program will include the following services:  Physical Therapy (PT), Occupational Therapy (OT), 24 hour per day rehabilitation nursing, Care Coordinator, Rehabilitation Medicine, Nutrition Services, and Pharmacy Services ? ?Weekly team conferences will be held on Tuesday to discuss your progress.  Your Inpatient Rehabilitation Care Coordinator will talk with you frequently to get your input and to update you on team discussions.  Team conferences with you and your family in attendance may also be held. ? ?Expected length of stay: 7-10 days  Overall anticipated outcome: supervision-independent with device ? ?Depending on your progress and recovery, your program may change. Your Inpatient Rehabilitation Care Coordinator will coordinate services and will keep you informed of any changes. Your Inpatient Rehabilitation Care Coordinator's name and contact numbers are listed  below. ? ?The following services may also be recommended but are not provided by the [facility]:  ?Driving Evaluations ?Home Health Rehabiltiation Services ?Outpatient Rehabilitation Services ? ?  ?Arrangements will be made to provide these services after discharge if needed.  Arrangements include referral to agencies that provide these services. ? ?Your insurance has been verified to be:  [employer]-medicare ?Your primary doctor is:  Rita Ohara ? ?Pertinent information will be shared with your doctor and your insurance  company. ? ?Inpatient Rehabilitation Care Coordinator:  Ovidio Kin, [place] or (C) [phone] ? ?Information discussed with and copy given to patient by: Elease Hashimoto, 03/23/2022, 10:52 AM    ?

## 2022-03-23 NOTE — Discharge Summary (Signed)
Physician Discharge Summary  Patient ID: Eduardo [name] MRN: [id number] DOB/AGE: February 19, 1956 66 y.o.  Admit date: 03/21/2022 Discharge date: 03/31/2022  Discharge Diagnoses:  Principal Problem:   S/P BKA (below knee amputation), right ([place]) Active Problems:   Diabetes mellitus ([place])   Insomnia   Constipation Acute problems: Peripheral arterial disease Diabetes mellitus Heart failure Hyperlipidemia Chronic kidney disease Coronary artery disease Constipation Morbid obesity Essential hypertension Acute blood loss anemia Insomnia  Discharged Condition: good  Significant Diagnostic Studies: VAS [place] ABI WITH/WO TBI  Result Date: 03/09/2022  LOWER EXTREMITY DOPPLER STUDY Patient Name:  QUADE RAMIREZ  Date of Exam:   03/09/2022 Medical Rec #: [id number]     Accession #:    [id number] Date of Birth: Feb 25, 1956    Patient Gender: M Patient Age:   51 years Exam Location:  [facility] Procedure:      VAS [place] ABI WITH/WO TBI Referring Phys: MARCUS DUDA --------------------------------------------------------------------------------  Indications: Ulceration. High Risk Factors: Hypertension, Diabetes. Other Factors: Sepsis, patient on pressors.  Comparison Study: Prior ABI done 06/10/16 Performing Technologist: Sharion Dove RVS  Examination Guidelines: A complete evaluation includes at minimum, Doppler waveform signals and systolic blood pressure reading at the level of bilateral brachial, anterior tibial, and posterior tibial arteries, when vessel segments are accessible. Bilateral testing is considered an integral part of a complete examination. Photoelectric Plethysmograph (PPG) waveforms and toe systolic pressure readings are included as required and additional duplex testing as needed. Limited examinations for reoccurring indications may be performed as noted.  ABI Findings: +---------+------------------+-----+-----------+--------+ Right    Rt Pressure (mmHg)IndexWaveform   Comment   +---------+------------------+-----+-----------+--------+ Brachial 82                     multiphasic         +---------+------------------+-----+-----------+--------+ PTA      57                0.70 monophasic          +---------+------------------+-----+-----------+--------+ DP       61                0.74 monophasic          +---------+------------------+-----+-----------+--------+ Great Toe0                 0.00                     +---------+------------------+-----+-----------+--------+ +---------+------------------+-----+-----------+-------+ Left     Lt Pressure (mmHg)IndexWaveform   Comment +---------+------------------+-----+-----------+-------+ Brachial 80                     multiphasic        +---------+------------------+-----+-----------+-------+ PTA      101               1.23 multiphasic        +---------+------------------+-----+-----------+-------+ DP       67                0.82 multiphasic        +---------+------------------+-----+-----------+-------+ Great Toe0                 0.00                    +---------+------------------+-----+-----------+-------+ +-------+-----------+-----------+------------+------------+ ABI/TBIToday's ABIToday's TBIPrevious ABIPrevious TBI +-------+-----------+-----------+------------+------------+ Right  0.74       0          0.72  no prior     +-------+-----------+-----------+------------+------------+ Left   1.23       0          1.01        No prior     +-------+-----------+-----------+------------+------------+  Bilateral ABIs appear essentially unchanged compared to prior study on 06/10/2016.  Summary: Right: Resting right ankle-brachial index indicates moderate right lower extremity arterial disease. The right toe-brachial index is abnormal. Left: Resting left ankle-brachial index is within normal range. No evidence of significant left lower extremity arterial disease. The left  toe-brachial index is abnormal. *See table(s) above for measurements and observations.  Electronically signed by Monica Martinez MD on 03/09/2022 at 4:59:09 PM.    Final    ECHOCARDIOGRAM COMPLETE  Result Date: 03/17/2022    ECHOCARDIOGRAM REPORT   Patient Name:   QUAID YEAKLE Date of Exam: 03/16/2022 Medical Rec #:  [id number]    Height:       69.0 in Accession #:    [id number]   Weight:       269.0 lb Date of Birth:  10-Jun-1956   BSA:          2.343 m Patient Age:    44 years     BP:           104/68 mmHg Patient Gender: M            HR:           102 bpm. Exam Location:  Inpatient Procedure: 2D Echo, Cardiac Doppler, Color Doppler and Intracardiac            Opacification Agent Indications:    Bacteremia  History:        Patient has prior history of Echocardiogram examinations, most                 recent 12/29/2020. CHF, Prior CABG; Risk Factors:Diabetes. PVD.                 CKD.  Sonographer:    Clayton Lefort RDCS (AE) Referring Phys: [id number] CHRISTOPHER P DANFORD IMPRESSIONS  1. Left ventricular ejection fraction, by estimation, is 30 to 35%. Left ventricular ejection fraction by PLAX is 37 %. The left ventricle has moderate to severely decreased function. The left ventricle demonstrates global hypokinesis. The left ventricular internal cavity size was mildly dilated. Left ventricular diastolic parameters are indeterminate. Elevated left ventricular end-diastolic pressure. There is akinesis of the left ventricular, entire inferolateral wall.  2. Right ventricular systolic function is normal. The right ventricular size is normal.  3. Left atrial size was moderately dilated.  4. The mitral valve is normal in structure. Mild to moderate mitral valve regurgitation. No evidence of mitral stenosis.  5. The aortic valve appears myxomatous. There is no obvious vegetation.. The aortic valve is tricuspid. Aortic valve regurgitation is not visualized. Aortic valve sclerosis is present, with no evidence of aortic valve  stenosis. Comparison(s): No significant change from prior study. 12/29/2020. AV thickness is new. Poor echo window. Recommend TEE if clinically indicated. FINDINGS  Left Ventricle: Left ventricular ejection fraction, by estimation, is 30 to 35%. Left ventricular ejection fraction by PLAX is 37 %. The left ventricle has moderate to severely decreased function. The left ventricle demonstrates global hypokinesis. Definity contrast agent was given IV to delineate the left ventricular endocardial borders. The left ventricular internal cavity size was mildly dilated. There is no left ventricular hypertrophy. Left ventricular diastolic function could not be evaluated  due to abnormal  septal motion. Left ventricular diastolic parameters are indeterminate. Elevated left ventricular end-diastolic pressure. Right Ventricle: The right ventricular size is normal. No increase in right ventricular wall thickness. Right ventricular systolic function is normal. Left Atrium: Left atrial size was moderately dilated. Right Atrium: Right atrial size was normal in size. Pericardium: There is no evidence of pericardial effusion. Mitral Valve: Consider papillary muscle dysfunction in view of inferor lateral akinesis and posteriorly directed MR. The mitral valve is normal in structure. Mildly decreased mobility of the mitral valve leaflets. Mild to moderate mitral valve regurgitation, with posteriorly-directed jet. No evidence of mitral valve stenosis. Tricuspid Valve: The tricuspid valve is normal in structure. Tricuspid valve regurgitation is trivial. No evidence of tricuspid stenosis. Aortic Valve: The aortic valve appears myxomatous. There is no obvious vegetation. The aortic valve is tricuspid. Aortic valve regurgitation is not visualized. Aortic valve sclerosis is present, with no evidence of aortic valve stenosis. Aortic valve mean gradient measures 2.0 mmHg. Aortic valve peak gradient measures 3.5 mmHg. Aortic valve area, by VTI  measures 3.05 cm. Pulmonic Valve: The pulmonic valve was not well visualized. Pulmonic valve regurgitation is trivial. Aorta: The aortic root is normal in size and structure. IAS/Shunts: The interatrial septum was not assessed.  LEFT VENTRICLE PLAX 2D LV EF:         Left            Diastology                ventricular     LV e' medial:    5.78 cm/s                ejection        LV E/e' medial:  19.9                fraction by     LV e' lateral:   6.98 cm/s                PLAX is 37      LV E/e' lateral: 16.5                %. LVIDd:         7.10 cm LVIDs:         5.80 cm LV PW:         1.20 cm LV IVS:        1.30 cm LVOT diam:     2.20 cm LV SV:         49 LV SV Index:   21 LVOT Area:     3.80 cm  RIGHT VENTRICLE             IVC RV Basal diam:  3.00 cm     IVC diam: 1.70 cm RV S prime:     13.50 cm/s TAPSE (M-mode): 2.0 cm LEFT ATRIUM              Index        RIGHT ATRIUM          Index LA diam:        4.20 cm  1.79 cm/m   RA Area:     9.38 cm LA Vol (A2C):   134.0 ml 57.19 ml/m  RA Volume:   17.60 ml 7.51 ml/m LA Vol (A4C):   104.0 ml 44.38 ml/m LA Biplane Vol: 120.0 ml 51.21 ml/m  AORTIC VALVE AV Area (Vmax):    3.27 cm AV Area (Vmean):   2.97 cm  AV Area (VTI):     3.05 cm AV Vmax:           93.20 cm/s AV Vmean:          64.800 cm/s AV VTI:            0.162 m AV Peak Grad:      3.5 mmHg AV Mean Grad:      2.0 mmHg LVOT Vmax:         80.10 cm/s LVOT Vmean:        50.700 cm/s LVOT VTI:          0.130 m LVOT/AV VTI ratio: 0.80  AORTA Ao Root diam: 3.50 cm Ao Asc diam:  3.70 cm MITRAL VALVE MV Area (PHT): 4.89 cm     SHUNTS MV Decel Time: 155 msec     Systemic VTI:  0.13 m MV E velocity: 115.00 cm/s  Systemic Diam: 2.20 cm Adrian Prows MD Electronically signed by Adrian Prows MD Signature Date/Time: 03/17/2022/2:40:29 PM    Final    ECHO TEE  Result Date: 03/18/2022    TRANSESOPHOGEAL ECHO REPORT   Patient Name:   CRIXUS MCAULAY Date of Exam: 03/18/2022 Medical Rec #:  [id number]    Height:       69.0 in  Accession #:    [id number]   Weight:       260.1 lb Date of Birth:  Mar 18, 1956   BSA:          2.310 m Patient Age:    67 years     BP:           72/58 mmHg Patient Gender: M            HR:           79 bpm. Exam Location:  Inpatient Procedure: Transesophageal Echo, Cardiac Doppler and Color Doppler Indications:     Bacteremia R78.81  History:         Patient has prior history of Echocardiogram examinations, most                  recent 03/17/2022. CHF, CAD, Prior CABG, PAD; Risk                  Factors:Diabetes and Hypertension. Streptococcal bacteremia.  Sonographer:     Darlina Sicilian RDCS Referring Phys:  [place] Diagnosing Phys: Adrian Prows MD PROCEDURE: After discussion of the risks and benefits of a TEE, an informed consent was obtained from the patient. TEE procedure time was 6 minutes. The transesophogeal probe was passed without difficulty through the esophogus of the patient. Imaged were  obtained with the patient in a left lateral decubitus position. Sedation performed by different physician. The patient was monitored while under deep sedation. Anesthestetic sedation was provided intravenously by Anesthesiology: 112.'04mg'$  of Propofol, '100mg'$  of Lidocaine. Image quality was good. The patient's vital signs; including heart rate, blood pressure, and oxygen saturation; remained stable throughout the procedure. The patient developed no complications during the procedure. IMPRESSIONS  1. Left ventricular ejection fraction, by estimation, is 20 to 25%. The left ventricle has severely decreased function. The left ventricle demonstrates global hypokinesis. The left ventricular internal cavity size was severely dilated. Left ventricular diastolic function could not be evaluated.  2. Right ventricular systolic function is normal. The right ventricular size is normal.  3. Left atrial size was severely dilated. No left atrial/left atrial appendage thrombus was detected.  4. The mitral valve is  normal  in structure. Mild to moderate mitral valve regurgitation. No evidence of mitral stenosis.  5. Mild calcification of the non coronary cusp. The aortic valve is tricuspid. There is mild calcification of the aortic valve. Aortic valve regurgitation is trivial. Conclusion(s)/Recommendation(s): No evidence of vegetation/infective endocarditis on this transesophageael echocardiogram. FINDINGS  Left Ventricle: Left ventricular ejection fraction, by estimation, is 20 to 25%. The left ventricle has severely decreased function. The left ventricle demonstrates global hypokinesis. The left ventricular internal cavity size was severely dilated. There is no left ventricular hypertrophy. Left ventricular diastolic function could not be evaluated. Right Ventricle: The right ventricular size is normal. No increase in right ventricular wall thickness. Right ventricular systolic function is normal. Left Atrium: Left atrial size was severely dilated. No left atrial/left atrial appendage thrombus was detected. Right Atrium: Right atrial size was normal in size. Pericardium: There is no evidence of pericardial effusion. Mitral Valve: The mitral valve is normal in structure. Mildly decreased mobility of the mitral valve leaflets. Mild to moderate mitral valve regurgitation, with posteriorly-directed jet. No evidence of mitral valve stenosis. Tricuspid Valve: The tricuspid valve is normal in structure. Tricuspid valve regurgitation is trivial. No evidence of tricuspid stenosis. Aortic Valve: Mild calcification of the non coronary cusp. The aortic valve is tricuspid. There is mild calcification of the aortic valve. Aortic valve regurgitation is trivial. Pulmonic Valve: The pulmonic valve was normal in structure. Pulmonic valve regurgitation is trivial. No evidence of pulmonic stenosis. Aorta: The aortic root is normal in size and structure. Venous: The inferior vena cava was not well visualized. IAS/Shunts: No atrial level shunt detected  by color flow Doppler.  LEFT VENTRICLE PLAX 2D LVIDd:         7.20 cm  Adrian Prows MD Electronically signed by Adrian Prows MD Signature Date/Time: 03/18/2022/4:28:33 PM    Final     Labs:  Basic Metabolic Panel: Recent Labs  Lab 03/30/22 0035  NA 138  K 3.9  CL 108  CO2 23  GLUCOSE 79  BUN 25*  CREATININE 1.34*  CALCIUM 8.3*    CBC: Recent Labs  Lab 03/30/22 0035  WBC 5.5  HGB 11.8*  HCT 35.3*  MCV 91.2  PLT 207    CBG: Recent Labs  Lab 03/30/22 1135 03/30/22 1607 03/30/22 2107 03/31/22 0618 03/31/22 1127  GLUCAP 178* 75 91 123* 166*    Brief HPI:   MILON DETHLOFF is a 66 y.o. male who presented to the emergency department on 03/08/2022 with fever chills, malaise and a painful swollen right foot.  He was hypotensive and required vasopressors.  He was admitted to the ICU on broad-spectrum antibiotics.  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  Dr. Sharol Given was consulted and he underwent right below the knee amputation on 5/3.  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   Hospital Course: HURSHEL BOUILLON was admitted to rehab 03/21/2022 for inpatient therapies to consist of PT, ST and OT at least three hours five days a week. Past admission physiatrist, therapy team and rehab RN have worked together to provide customized collaborative inpatient rehab. Follow-up labs stable. BP on the low end normal. Melatonin 10 mg and trazodone started for insomnia.  Wound VAC removed 5/17. Incision healing. Weights doing well. Hemoglobin stable. Ready for discharge home in satisfactory condition.   Blood pressures were monitored on TID basis and overall stable (low normal) on Entresto and  torsemide.  Coreg, Imdur held. Hypotension noted on 5/21 and torsemide dose decreased to 10 mg daily.   Diabetes has been monitored with ac/hs CBG checks and SSI was use prn for tighter BS control.  Semglee was increased to  42 units daily on 5/14. Jardiance held (patient reported discontinued in February). Humalin changed back to home dose on 5/21>>30u in am and 15u HS.   Rehab course: During patient's stay in rehab weekly team conferences were held to monitor patient's progress, set goals and discuss barriers to discharge. At admission, patient required min assist for transfers and locomotion; min to mod a for ADLs.  He has had improvement in activity tolerance, balance, postural control as well as ability to compensate for deficits. He has had improvement in functional use RUE/LUE  and RLE/LLE as well as improvement in awareness  Disposition: Home Discharge disposition: 01-Home or Self Care      Diet: Carb modified  Special Instructions:  No driving, alcohol consumption or tobacco use.   30-35 minutes were spent on discharge planning and discharge summary.   Discharge Instructions     Ambulatory referral to Physical Medicine Rehab   Complete by: As directed    Hospital follow-up/BKA   Discharge patient   Complete by: As directed    Discharge disposition: 01-Home or Self Care   Discharge patient date: 03/31/2022     No driving, alcohol consumption or tobacco use.   30-35 minutes were spent on discharge planning and discharge summary.  Allergies as of 03/31/2022       Reactions   Shellfish-derived Products Nausea And Vomiting, Other (See Comments)   Only Mussels cause severe nausea and vomiting   Latex Rash   Tape Rash, Other (See Comments)   Caused issues with the skin   Testosterone Rash   Other reaction(s): did not feel well Other reaction(s): did not feel well        Medication List     STOP taking these medications    midodrine 5 MG tablet Commonly known as: PROAMATINE   pantoprazole 40 MG tablet Commonly known as: PROTONIX   polyethylene glycol 17 g packet Commonly known as: MIRALAX / GLYCOLAX   SILVER SULFADIAZINE EX   sodium chloride 0.65 % Soln nasal  spray Commonly known as: OCEAN       TAKE these medications    acetaminophen 325 MG tablet Commonly known as: TYLENOL Take 1-2 tablets (325-650 mg total) by mouth every 4 (four) hours as needed for mild pain. What changed:  how much to take when to take this reasons to take this   ascorbic acid 1000 MG tablet Commonly known as: VITAMIN C Take 1 tablet (1,000 mg total) by mouth daily.   aspirin 81 MG chewable tablet Chew 1 tablet (81 mg total) by mouth daily.   clopidogrel 75 MG tablet Commonly known as: PLAVIX TAKE 1 TABLET BY MOUTH DAILY   Entresto 24-26 MG Generic drug: sacubitril-valsartan TAKE 1 TABLET BY MOUTH  TWICE DAILY   ESTER C PO Take 1,000 mg by mouth daily.   ezetimibe 10 MG tablet Commonly known as: Zetia Take 1 tablet (10 mg total) by mouth daily.   gemfibrozil 600 MG tablet Commonly known as: LOPID Take 600 mg by mouth 2 (two) times daily before a meal.   HumaLOG KwikPen 100 UNIT/ML KwikPen Generic drug: insulin lispro Inject 0-10 Units into the skin See admin instructions. Inject 0-10 units into the skin three times a day, per sliding scale-  based on BGL >100   HumuLIN N KwikPen 100 UNIT/ML Kiwkpen Generic drug: Insulin NPH (Human) (Isophane) Inject 30 Units into the skin See admin instructions. Inject 30 units into the skin before breakfast and 15 U before supper   Krill Oil 300 MG Caps   Melatonin 10 MG Tabs Take 10 mg by mouth at bedtime.   multivitamin with minerals Tabs tablet Take 1 tablet by mouth daily.   nitroGLYCERIN 0.4 MG SL tablet Commonly known as: NITROSTAT Place 1 tablet (0.4 mg total) under the tongue every 5 (five) minutes as needed for chest pain.   oxyCODONE 5 MG immediate release tablet Commonly known as: Oxy IR/ROXICODONE Take 1-2 tablets (5-10 mg total) by mouth every 4 (four) hours as needed for moderate pain (pain score 4-6).   PROBIOTIC-10 PO Take 1 capsule by mouth daily.   rosuvastatin 20 MG  tablet Commonly known as: CRESTOR Take 1 tablet (20 mg total) by mouth daily.   tirzepatide 5 MG/0.5ML Pen Commonly known as: MOUNJARO Inject 5 mg into the skin every Friday.   torsemide 10 MG tablet Commonly known as: DEMADEX Take 1 tablet (10 mg total) by mouth daily. What changed:  medication strength how much to take   Vitamin D 50 MCG (2000 UT) Caps Take 2,000 Units by mouth daily.        Follow-up Information     Newt Minion, MD. Call.   Specialty: Orthopedic Surgery Why: Call office to make arrangements for hospital/post-op follow-up appointment Contact information: [place] [address] [phone]         Rita Ohara, MD Follow up.   Specialty: Family Medicine Why: Call to make arrangments for hospital follow-up appointment Contact information: [place] [address] [phone]         Courtney Heys, MD Follow up.   Specialty: Physical Medicine and Rehabilitation Why: Office will call you to arrange your appt (sent) Contact information: [address] [place] [address] [phone]         Jacelyn Pi, MD. Call.   Specialty: Endocrinology Why: Call to make arrangments for hospital follow-up appointment Contact information: [place] [address] [phone]         Alethia Berthold, [place]. Call.   Specialty: Cardiology Why: Call to make arrangments for hospital follow-up appointment Contact information: [place] [address] [phone]                 Signed: Barbie Banner 03/31/2022, 12:08 PM

## 2022-03-23 NOTE — Progress Notes (Signed)
Physical Therapy Session Note ? ?Patient Details  ?Name: Eduardo [name] ?MRN: [id number] ?Date of Birth: 09/24/1956 ? ?Today's Date: 03/23/2022 ?PT Individual Time: [id number] and [phone] ?PT Individual Time Calculation (min): 42 min and 77mn ? ?Short Term Goals: ?Week 1:  PT Short Term Goal 1 (Week 1): =LTG due to ELOS. ?Week 2:    ?Week 3:    ? ?Skilled Therapeutic Interventions/Progress Updates:  ?  SESSION ONE ?Pain 1/10 residual limb ?Pt donned limb guard in recliner w/set up only. ?Gait 130faround bed to wc, returns to recliner in same manner at end of session w/cga w/RW. ?Wc propulsion x [id number]/supervision. ? ?Wc to mat stand pivot transfer w/rw w/cga ? ? LAQs x 15 ?Sit to supine w/supervision ?SLR x 15 ?Quad sets x 15 ?Sidelying hip extens x 10 ?Sidelying hip abd x 10 ?Prone knee flexion x 10 ?Prone hip extension x 10 ? ?Prone lying x 8 min for anterior flank stretch ? ?Education re: preprosthetic training, purpose of therex, prosthetic process, use of shrinker. ?Mat to wc stand pivot transfer w/RW w/cga. ?Pt propels to room w/supervision. ? ? ? ?SESSION Two ?Gait 69f60f/from recliner/wc w/RW w/cga. ?Wc propulsion w/supervision > 100ft88fdayroom. ? ?Gait 15ft 66f w/cga to min assist w/2 episodes of decreased ability to offload LLE/clear via press and sway technique. ? ?Repeated Sit to stand from armchair x 5 for LLE functional strengthening.  ? ?Discussed importance of pacing/energy conservation + EF 20-25%,energy demands of healing. ? ?Pt left oob in recliner w/chair alarm set and needs in reach. ? ? ? ? ? ?Therapy Documentation ?Precautions:  ?Precautions ?Precautions: Fall (R BKA) ?Precaution Comments: R BKA ?Required Braces or Orthoses: Other Brace ?Other Brace: limb protector ?Restrictions ?Weight Bearing Restrictions: Yes ?RLE Weight Bearing: Non weight bearing ? ? ? ?Therapy/Group: Individual Therapy ?BarbarCallie Fielding ? ?BarbarJerrilyn [place]/2023, 3:52 PM  ?

## 2022-03-24 LAB — GLUCOSE, CAPILLARY
Glucose-Capillary: 111 mg/dL — ABNORMAL HIGH (ref 70–99)
Glucose-Capillary: 129 mg/dL — ABNORMAL HIGH (ref 70–99)
Glucose-Capillary: 148 mg/dL — ABNORMAL HIGH (ref 70–99)
Glucose-Capillary: 69 mg/dL — ABNORMAL LOW (ref 70–99)
Glucose-Capillary: 74 mg/dL (ref 70–99)

## 2022-03-24 MED ORDER — MELATONIN 5 MG PO TABS
10.0000 mg | ORAL_TABLET | Freq: Every day | ORAL | Status: DC
  Administered 2022-03-24 – 2022-03-30 (×7): 10 mg via ORAL
  Filled 2022-03-24 (×7): qty 2

## 2022-03-24 NOTE — Progress Notes (Signed)
Physical Therapy Session Note ? ?Patient Details  ?Name: Eduardo [name] ?MRN: [id number] ?Date of Birth: 1955-12-20 ? ?Today's Date: 03/24/2022 ?PT Individual Time: 6433-2951 ?PT Individual Time Calculation (min): 47 min  ? ?Short Term Goals: ?Week 1:  PT Short Term Goal 1 (Week 1): =LTG due to ELOS. ? ?Skilled Therapeutic Interventions/Progress Updates:  ?  Session focused on functional transfers with RW, therex to address functional strengthening, ROM and muscular endurance, and dynamic standing balance activities. Pt performed ambulatory transfers with RW with supervision throughout session with safe technique. CGA for functional gait short distances in the room with RW. Squat pivot transfers on and off mat with close supervision to CGA with cues for improved technique. Pt managing w/c parts independently and limb guard. On mat performed seated R flexion/extension, sidelying R hip abduction and extension, and prone knee flexion x 10 reps each with cues for technique - good response to feedback and use of breath to support through exercise. Educated on imporatnce of core strengthening targeted exercises as well and performed seated diagonals with 6# weighted medicine ball each direction x 10 reps and modified curl ups (propped on wedge and pillows) x 10 reps and then lateral reaches to target obliques x 10 reps each direction. Pt very receptive to all feedback. Utilized BITS for dynamic standing balance activity with alternating BUE reaching task to hit visual targets in various directions with CGA overall for balance when reaching farther outside BOS. End of session returned back to recliner as described above. All needs in reach.  ? ?Therapy Documentation ?Precautions:  ?Precautions ?Precautions: Fall (R BKA) ?Precaution Comments: R BKA ?Required Braces or Orthoses: Other Brace ?Other Brace: limb protector ?Restrictions ?Weight Bearing Restrictions: Yes ?RLE Weight Bearing: Non weight bearing ? ?Pain: ? Denies  pain ? ?Therapy/Group: Individual Therapy ? ?Allayne Gitelman ?Lars Masson, PT, DPT, CBIS ? ?03/24/2022, 3:12 PM  ?

## 2022-03-24 NOTE — Progress Notes (Signed)
Physical Therapy Session Note ? ?Patient Details  ?Name: Eduardo [name] ?MRN: [id number] ?Date of Birth: 1956/08/02 ? ?Today's Date: 03/24/2022 ?PT Individual Time: 0233-4356 ?PT Individual Time Calculation (min): 58 min  ? ?Short Term Goals: ?Week 1:  PT Short Term Goal 1 (Week 1): =LTG due to ELOS. ? ?Skilled Therapeutic Interventions/Progress Updates:  ?  Pt presents up in recliner and agreeable to session. Discussed and problem solved through return to activites, home environment for d/c planning, energy conservation education (how limb loss affects energy use/sound limb, etc), and overall progress towards goals. Pt very motivated and already asking excellent questions in regards to recovery, return to activity,etc. Would benefit from TR consult and potentially outing prior to d/c.  ? ?Performed ambulatory transfers with RW with cloes supervision to CGA for balance and squat pivot with CGA with pt independently managing w/c parts as well throughout session. Pt also independent with donning and doffing of limb guard. Functional gait in room with RW with CGA x 15' with improved technique and pt aware of need for improved foot clearance vs shuffle. ? ?Discussed floor transfers (for exercises on yoga mat per pt preferred activity as well as in case of fall). Demonstrated techniques and verbalized options to progress towards working towards this. On mat table demonstrated backing up onto step to simulate progression initially with 6" block but unable to clear, went down to 4" step and with several attempts and cues for technique able to perform x 10 reps with rest breaks and extra time. Pt surprised at how much energy it takes to do this "small task" and discussed/educated overall on energy expenditure. Positioned in prone for stretching of anterior hips, low back, and through BLE. ? ?Gait training with RW x 20' in gym with focus on technique with overall CGA for balance. ? ?Self propelled w/c to and from therapies for  functional mobility and overall strengthening/endurance mod I.  ? ?Therapy Documentation ?Precautions:  ?Precautions ?Precautions: Fall (R BKA) ?Precaution Comments: R BKA ?Required Braces or Orthoses: Other Brace ?Other Brace: limb protector ?Restrictions ?Weight Bearing Restrictions: Yes ?RLE Weight Bearing: Non weight bearing ? ?Pain: ?Pain Assessment ?Pain Scale: 0-10 ?Pain Score: 0-No pain ? ? ? ?Therapy/Group: Individual Therapy ? ?Allayne Gitelman ?Lars Masson, PT, DPT, CBIS ?03/24/2022, 9:31 AM  ?

## 2022-03-24 NOTE — Progress Notes (Signed)
Occupational Therapy Session Note ? ?Patient Details  ?Name: Eduardo [name] ?MRN: [id number] ?Date of Birth: [birth year] ? ?Today's Date: 03/24/2022 ?OT Individual Time: 1300-1400 ?OT Individual Time Calculation (min): 60 min  ? ? ?Short Term Goals: ?Week 1:  STG=LTG's  ? ?Skilled Therapeutic Interventions/Progress Updates:  ? ?Pt seated in recliner upon OT arrival for 2nd OT session this day. Pt and OT explored potential portable ramp solutions online in prep for d/c next Tuesday 03/31/22. Pt has a 3" step and then a 1" threshold to navigate. Pt to work with girlfriend to order asap. Pt agreeable to work on bathroom mobility and requested to trial off and on toilet without BSC over top. Pt amb 8 ft with RW to w/c from recliner with CGA. Then pushed in w/c to entry of bathroom with another 5 ft to and from toilet with CGA. Transferred w/c to and from shower seat with CGA. OT updated Care Board to allow pt to use toilet with grab bar or BSC with nurse support. Left pt at bedside in recliner with call bell, alarm in place and needs within reach. No complaints of pain throughout session.  ? ? ?Therapy/Group: Individual Therapy ? ?[name] ?03/24/2022, 9:56 AM ?

## 2022-03-24 NOTE — Progress Notes (Signed)
Patient ID: Eduardo [name], male   DOB: 08-Sep-1956, 66 y.o.   MRN: [id number]  Met with pt to update regarding team conference goals of mod/I-supervision and target discharge of 5/23. He feels he is doing well and making preparations for home. He is ordering a portable ramp to get into his home and the one step he has. He is ordering a bariatric rolling walker and wants this worker to get the wheelchair he needs. Discussed home health and he has no preference wants one which his insurance covers. Will work on this and his girlfriend to come in and attend therapies so she feels comfortable with his discharge.  ?

## 2022-03-24 NOTE — Progress Notes (Signed)
?                                                       PROGRESS NOTE ? ? ?Subjective/Complaints: ? ?Sleep poor but improved. No additional concerns.  ? ?ROS No fever, chills, CP, SOB, N/V/D/C ? ?Objective: ?  ?No results found. ?Recent Labs  ?  03/23/22 ?3016  ?WBC 5.5  ?HGB 12.0*  ?HCT 37.5*  ?PLT 303  ? ? ?Recent Labs  ?  03/23/22 ?0109  ?NA 136  ?K 4.1  ?CL 106  ?CO2 23  ?GLUCOSE 118*  ?BUN 31*  ?CREATININE 1.36*  ?CALCIUM 8.4*  ? ? ? ?Intake/Output Summary (Last 24 hours) at 03/24/2022 0741 ?Last data filed at 03/24/2022 0735 ?Gross per 24 hour  ?Intake 720 ml  ?Output 2025 ml  ?Net -1305 ml  ? ?  ? ?  ? ?Physical Exam: ?Vital Signs ?Blood pressure (!) 95/59, pulse (!) 58, temperature 98.1 ?F (36.7 ?C), resp. rate 18, height '5\' 10"'$  (1.778 m), weight 119 kg, SpO2 98 %. ? ? ?General: No acute distress, in bed ?Mood and affect are appropriate ?Heart: Regular rate and rhythm no rubs murmurs or extra sounds ?Lungs: Clear to auscultation, breathing unlabored, ?Abdomen: Positive bowel sounds, soft nontender to palpation, nondistended ?Extremities: No clubbing, cyanosis, or edema ?Skin: No evidence of breakdown, no evidence of rash ? ? ?Neuro:  Alert and oriented , follows commands, insight normal, memory intact, speech normal ?Sensation intact to LT b/l UE, decreased sensation in RLE. CN2-12 grossly intact ?Musculoskeletal: R BKA with wound vac and shrinker, minimal drainage in vac ?Strength 5/5 in b/L UE and RLE ?Bruising on b/l arms ?Dupuytren contractures b/l hands ? ?Assessment/Plan: ?1. Functional deficits which require 3+ hours per day of interdisciplinary therapy in a comprehensive inpatient rehab setting. ?Physiatrist is providing close team supervision and 24 hour management of active medical problems listed below. ?Physiatrist and rehab team continue to assess barriers to discharge/monitor patient progress toward functional and medical goals ? ?Care Tool: ? ?Bathing ?   ?Body parts bathed by patient: Right  arm, Left arm, Chest, Abdomen, Front perineal area, Right upper leg, Left upper leg, Right lower leg, Left lower leg, Face, Buttocks  ?   ?  ?  ?Bathing assist Assist Level: Minimal Assistance - Patient > 75% (LE) ?  ?  ?Upper Body Dressing/Undressing ?Upper body dressing   ?What is the patient wearing?: [place] only ?   ?Upper body assist Assist Level: Minimal Assistance - Patient > 75% ?   ?Lower Body Dressing/Undressing ?Lower body dressing ? ? ?   ?What is the patient wearing?: Pants ? ?  ? ?Lower body assist Assist for lower body dressing: Minimal Assistance - Patient > 75% ?   ? ?Toileting ?Toileting    ?Toileting assist Assist for toileting: Minimal Assistance - Patient > 75% ?  ?  ?Transfers ?Chair/bed transfer ? ?Transfers assist ?   ? ?Chair/bed transfer assist level: Contact Guard/Touching assist ?  ?  ?Locomotion ?Ambulation ? ? ?Ambulation assist ? ?   ? ?Assist level: Contact Guard/Touching assist ?Assistive device: Walker-rolling ?Max distance: 15 ft  ? ?Walk 10 feet activity ? ? ?Assist ?   ? ?Assist level: Contact Guard/Touching assist ?Assistive device: Walker-rolling  ? ?Walk 50 feet activity ? ? ?Assist Walk 50  feet with 2 turns activity did not occur: Safety/medical concerns ? ?  ?   ? ? ?Walk 150 feet activity ? ? ?Assist Walk 150 feet activity did not occur: Safety/medical concerns ? ?  ?  ?  ? ?Walk 10 feet on uneven surface  ?activity ? ? ?Assist Walk 10 feet on uneven surfaces activity did not occur: Safety/medical concerns ? ? ?  ?   ? ?Wheelchair ? ? ? ? ?Assist Is the patient using a wheelchair?: Yes ?Type of Wheelchair: Manual ?  ? ?Wheelchair assist level: Supervision/Verbal cueing ?Max wheelchair distance: 150  ? ? ?Wheelchair 50 feet with 2 turns activity ? ? ? ?Assist ? ?  ?  ? ? ?Assist Level: Supervision/Verbal cueing  ? ?Wheelchair 150 feet activity  ? ? ? ?Assist ?   ? ? ?Assist Level: Supervision/Verbal cueing  ? ?Blood pressure (!) 95/59, pulse (!) 58, temperature 98.1  ?F (36.7 ?C), resp. rate 18, height '5\' 10"'$  (1.778 m), weight 119 kg, SpO2 98 %. ? ?  ?Medical Problem List and Plan: ?1. 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           - He had strep bacteremia and septic shock with diabetic foot infection on omoxicillin ?            -patient may not shower due to wound vac ?            -ELOS/Goals: 9-12 days mod I to supervision with PT/OT ? -Continue OT/PT ?           -Wound vac for about 1 week ?2.  Antithrombotics: ?-DVT/anticoagulation:  Pharmaceutical: Lovenox 60 mg daily ?            -antiplatelet therapy: Plavix and aspirin ?3. Pain Management: Tylenol, oxycodone, tramadol as needed ?           -Well controlled ?4. Mood: LCSW to evaluate and provide emotional support ?            -antipsychotic agents: n/a ?5. Neuropsych: This patient is capable of making decisions on his own behalf. ?6. Skin/Wound Care: Routine skin care checks ?7. Fluids/Electrolytes/Nutrition: Routine Is and Os and follow-up chemistries ?Need to monitor Na+ on sacubatril and  ? ?8: Diabetes mellitus: CBGs 4 times daily ?-- 5/14 CGB a little elevated Semglee 40 units daily, will increase to 42 units ?            -- Continue meal coverage NovoLog 3 units with meals ?-- Continue sliding scale log with meals and bedtime ?--at home on weekly Mounjara; stopped Jardiance in February ?CBG (last 3)  ?Recent Labs  ?  03/23/22 ?1658 03/23/22 ?2057 03/24/22 ?0618  ?GLUCAP 91 193* 111*  ? ? ?5/16 Good control overall, a few elevated values, continue to follow ?9: Heart failure: Daily weight ?            -- Continue torsemide 20 mg twice daily ?            -- Continue Entresto 24/26 mg twice daily ?10: Hyperlipidemia: Continue Crestor, Lopid, Zetia, discussed healthy diet ?11: Peripheral arterial disease: Continue Plavix, aspirin, statin ?12: AKI atop chronic kidney disease: Serum creatinine continues downward trend and appears at baseline.   ?-Cr down to 1.36 today, improved, continue to  follow with weekly labs ?13: Coronary artery disease: Stable; continue heart meds, antiplatelet meds, antihyperlipidemia medications.  Jardiance, Imdur and Coreg currently held ?14: Constipation: Continue senokot, miralax ?15:  Morbid obesity: dietary counseling, carb modified diet ?16: Essential hypertension: monitor TID/as needed.  Coreg and Jardiance currently held ? -Bps on the lower side for several days, overall stable and continue to monitor ?Vitals:  ? 03/23/22 1952 03/24/22 0510  ?BP: 99/61 (!) 95/59  ?Pulse: 70 (!) 58  ?Resp: 17 18  ?Temp: 97.8 ?F (36.6 ?C) 98.1 ?F (36.7 ?C)  ?SpO2: 96% 98%  ? ?On entresto and demadex monitor for dizziness  ? ?17. Acute blood loss anemia ?           -Follow H/H  ?18. Insomnia  ? -trazadone and melatonin ? -5/16 increase melatonin to '10mg'$ , his home dose ? ?LOS: ?3 days ?A FACE TO FACE EVALUATION WAS PERFORMED ? ?Jennye Boroughs ?03/24/2022, 7:41 AM  ? ?  ?

## 2022-03-24 NOTE — Patient Care Conference (Signed)
Inpatient RehabilitationTeam Conference and Plan of Care Update Date: 03/24/2022   Time: 11:36 AM    Patient Name: Eduardo [name]      Medical Record Number: [id number]  Date of Birth: August 14, 1956 Sex: Male         Room/Bed: 4W04C/4W04C-01 Payor Info: Payor: [place] / Plan: [employer] MEDICARE / Product Type: *No Product type* /    Admit Date/Time:  03/21/2022  4:03 PM  Primary Diagnosis:  S/P BKA (below knee amputation), right [facility])  Hospital Problems: Principal Problem:   S/P BKA (below knee amputation), right [facility])    Expected Discharge Date: Expected Discharge Date: 03/31/22  Team Members Present: Physician leading conference: Dr. Jennye Boroughs Social Worker Present: Ovidio Kin, LCSW Nurse Present: Dorthula Nettles, RN PT Present: Leavy Cella, PT OT Present: Jamey Ripa, OT PPS Coordinator present : Gunnar Fusi, SLP     Current Status/Progress Goal Weekly Team Focus  Bowel/Bladder   continent b/b  remain continent  toilet as needed   Swallow/Nutrition/ Hydration             ADL's   min A ADL's, cg A transfers to toilet with grab bar  mod I/S  ADL and functional mobility training, activity tolerance, falls prevention, AE/DME training   Mobility   CGA sit<>stand, CGA transfers, supervision w/c mobility 17f, Gt 15 ft CGA.  supervison transfers, gait x 75', and 1 4" step to enter home; mod I w/c mobility  gait, stairs, strengthening, activity tolerance, functional mobility   Communication             Safety/Cognition/ Behavioral Observations            Pain   reports some pain  <2  assess pain q 4hr and prn   Skin   Right BKA with wound vac in place  no new breakdown or infection  assess skin q shift and prn     Discharge Planning:  Lives with significan other who can be there and assist but does have some health issues. Will need education prior to DC home   Team Discussion: Main complaint is not sleeping. Will adjust medications. Wound vac not  draining a significant amount. Continent B/B, reports some minor pain. No additional skin breakdown. S/O lives with patient but can't physically assist. Looking for grab bars for shower. To order ramp for small step.  Patient on target to meet rehab goals: yes, supervision transfers, mod I WC mobility.  *See Care Plan and progress notes for long and short-term goals.   Revisions to Treatment Plan:  Adjusting medications.   Teaching Needs: Family education, medication/pain management, sleep management, skin/wound care, transfer training, etc.   Current Barriers to Discharge: Decreased caregiver support, Home enviroment access/layout, Wound care, Lack of/limited family support, Weight, and Weight bearing restrictions  Possible Resolutions to Barriers: Family education Follow-up therapy Follow-up nursing care Discontinue wound vac     Medical Summary Current Status: R BKA, WOund vac, Diabetes, heart failure, insominia, hypertension, anemia  Barriers to Discharge: Medical stability;Wound care;Home enviroment access/layout  Barriers to Discharge Comments: R BKA, WOund vac, Diabetes, heart failure, insominia, hypertension, anemia Possible Resolutions to BCel[employer] CorporationFocus: follow labs, monitor glucose and adjust DM meds, follow weights, wound care   Continued Need for Acute Rehabilitation Level of Care: The patient requires daily medical management by a physician with specialized training in physical medicine and rehabilitation for the following reasons: Direction of a multidisciplinary physical rehabilitation program to maximize functional independence : Yes  Medical management of patient stability for increased activity during participation in an intensive rehabilitation regime.: Yes Analysis of laboratory values and/or radiology reports with any subsequent need for medication adjustment and/or medical intervention. : Yes   I attest that I was present, lead the team conference, and  concur with the assessment and plan of the team.   Cristi Loron 03/24/2022, 4:01 PM

## 2022-03-24 NOTE — Progress Notes (Signed)
Occupational Therapy Session Note ? ?Patient Details  ?Name: Eduardo [name] ?MRN: [id number] ?Date of Birth: Nov 05, 1956 ? ?Today's Date: 03/24/2022 ?OT Individual Time: 1100-1130 ?OT Individual Time Calculation (min): 30 min  ? ? ?Short Term Goals: ?Week 1:   STG=LTG's  ? ?Skilled Therapeutic Interventions/Progress Updates:  ? ?Pt in recliner at start of session. Primary OT meeting pt for 1st time and preparing to enter into [place] directly after session. Discussed following pt's girlfriend sending photos of entry way and bathroom, what ramping and grab bar needs he will require upon d/c. OT made recommendations for grab bars within toilet closet on B sides and a vertical and angled horizontal in shower stall. Pt reports he has a small lip to navigate in shower and OT problem solved various strategies in prep for identifying DME needs. OT assessed pt;s standing tolerance for LB self care and pt able to perform sit to and from stand x 4 sets with 2 min intervals and CGA with RW. Left pt seated in recliner with call bell, chair alarm and needs in place. No pain reported throughout session.  ? ? ?Therapy/Group: Individual Therapy ? ?[name] ?03/24/2022, 9:56 AM ?

## 2022-03-25 DIAGNOSIS — I25810 Atherosclerosis of coronary artery bypass graft(s) without angina pectoris: Secondary | ICD-10-CM

## 2022-03-25 LAB — GLUCOSE, CAPILLARY
Glucose-Capillary: 114 mg/dL — ABNORMAL HIGH (ref 70–99)
Glucose-Capillary: 215 mg/dL — ABNORMAL HIGH (ref 70–99)
Glucose-Capillary: 116 mg/dL — ABNORMAL HIGH (ref 70–99)
Glucose-Capillary: 76 mg/dL (ref 70–99)

## 2022-03-25 NOTE — Progress Notes (Signed)
Wound vac removed as ordered. Patient tolerated well. Staples CDI, no S/S of infection.  ?

## 2022-03-25 NOTE — Progress Notes (Signed)
Physical Therapy Session Note  Patient Details  Name: Eduardo [name] MRN: [id number] Date of Birth: February 17, 1956  Today's Date: 03/25/2022 PT Individual Time: 0807-0905 PT Individual Time Calculation (min): 58 min   Short Term Goals: Week 1:  PT Short Term Goal 1 (Week 1): =LTG due to ELOS.  Skilled Therapeutic Interventions/Progress Updates:  Patient seated in recliner with BLE elevated on entrance to room. Patient alert and agreeable to PT session. Pt initially emotionally "down" on entrance to room and when questioned, relates that he was just thinking about his life now compared to just a month ago. Emotional support provided and reminded pt that he will get back to his old hobbies and activities - he may just have to make a few modifications in order to fully return. Decision made to attempt to recreate a favorite activity of pt's during session.   Patient with no pain complaint throughout session.  Therapeutic Activity: Transfers: Patient performed sit<>stand and squat pivot transfers with supervision/ CGA and squat pivot transfers with CGA. Ambulatory transfer recliner to w/c using RW with CGA. Squat pivot transfer w/c <>mat table requires several moves/ efforts prior to reaching target. Performed with close supervision and hold to w/c to prevent sliding on floor. Provided verbal cues for increasing clearance. Pt guided in lateral and fwd/ bkwd scooting on mat table for improved strengthening and technique.  Toile transfer performed at end of session with supervision. Clothing mgmt with supervision. Pt is able to return to bed on R side of bed to mimic home environment with supervision/ Mod I. Donned/ doffed limb protector with setup.  Wheelchair Mobility:  Physicist, medical on donning/ doffing leg rests requiring supervision with  vc/ tc throughout as pt is performing with one UE. Patient propelled wheelchair 175 feet x2 with Mod I.  Neuromuscular Re-ed: NMR facilitated during  session with focus on sitting balance, trunk mobility, and abdominal strengthening. Pt guided in recreation of kayak paddling with pt able to scoot backwards into longsitting on mat table. Used hockey stick with green t-band for resistance in paddle rowing. 69mn x2 fwd, 242m x2 bkwd on mat table, then 3x 64m10mfwd and 2x64mi13mkwd on Airex pad  with focus on pelvic rocking and improving mobility with balance. NMR performed for improvements in motor control and coordination, balance, sequencing, judgement, and self confidence/ efficacy in performing all aspects of mobility at highest level of independence.   Ortho arrives to discuss removal of wound vac and to return at end of session.   Patient supine  in bed at end of session with brakes locked, bed alarm set, and all needs within reach.   Therapy Documentation Precautions:  Precautions Precautions: Fall (R BKA) Precaution Comments: R BKA Required Braces or Orthoses: Other Brace Other Brace: limb protector Restrictions Weight Bearing Restrictions: Yes RLE Weight Bearing: Non weight bearing General:   Vital Signs:  Pain: No pain complaint throughout session.   Therapy/Group: Individual Therapy  JuliAlger Simons DPT 03/25/2022, 9:52 AM

## 2022-03-25 NOTE — Progress Notes (Signed)
Occupational Therapy Session Note ? ? ?Name: JODEY BURBANO ?MRN: [id number] ?Date of Birth: November 27, 1955 ? ?Today's Date: 03/25/2022 ?OT Individual Time: 0109-3235 ?OT Individual Time Calculation (min): 75 min  ? ? ?Short Term Goals: ?Week 1:  STG=LTG's ? ?Skilled Therapeutic Interventions/Progress Updates:  ?Pt seen for skilled OT visit this am with pt transferred back to bed resting after PT session awaiting R residual limb assessment by surgery PA team. Pt had been in PT when the medical team arrived but said they would assess him in his room but had not been yet. Pt reports they may discontinue the wound vac from what he understood. If wound vac d/c'd then OT will train in 1st shower tomorrow with R surgical site covered. OT worked with pt on discharge DME and home accessibility need planning, self care strategy training and UE therapeutic exercise. Pt has ordered the portable ramp recommended for entry into home, the bariatric RW, handyman coming out for grab bar install outside and inside shower stall and surrounding toilet walls and MSW ordering w/c. Pt concerned about peri hygiene strategies as during toilet use with BM pt required assist for wiping while standing with RW on L LE. OT made recommendation of low cost but effective bidet, educated in use, provided video and link for device to be added to standard toilet and pt interested in purchasing for handyman to install. This will reduce time, effort and falls risk on L LE in standing once home. OT assisted to demonstrate lateral weight shifts as well. Pt trained in B UE there ex with 3 lb weight bar for cane ex in supine for sh flex/ex, scap protraction/retraction, chest press, horizontal abd/add 10 reps each with min cues for technique and breathing between sets. Pt instructed in B UE HEP for between visits and for home carryover with medium theraband (red) for chest press, sh flex/ext, elbow flex and ext 10 reps each. Written info provided and min cues for  pacing and joint protection. Pt remained bed level at end of session and bed alarm, call bell and needs in place and within reach.  ? ?Therapy Documentation ?Precautions:  ?Precautions ?Precautions: Fall (R BKA) ?Precaution Comments: R BKA ?Required Braces or Orthoses: Other Brace ?Other Brace: limb protector ?Restrictions ?Weight Bearing Restrictions: Yes ?RLE Weight Bearing: Non weight bearing ? ? ? ?Therapy/Group: Individual Therapy ? ?[name] ?03/25/2022, 8:00 AM ?

## 2022-03-25 NOTE — Progress Notes (Signed)
?                                                       PROGRESS NOTE ? ? ?Subjective/Complaints: ? ?Reports he slept well last night. No new complaints or concerns.  ? ?ROS No fever, chills, CP, SOB, N/V/D/C. No HA. ? ?Objective: ?  ?No results found. ?Recent Labs  ?  03/23/22 ?9242  ?WBC 5.5  ?HGB 12.0*  ?HCT 37.5*  ?PLT 303  ? ? ?Recent Labs  ?  03/23/22 ?6834  ?NA 136  ?K 4.1  ?CL 106  ?CO2 23  ?GLUCOSE 118*  ?BUN 31*  ?CREATININE 1.36*  ?CALCIUM 8.4*  ? ? ? ?Intake/Output Summary (Last 24 hours) at 03/25/2022 0743 ?Last data filed at 03/25/2022 0736 ?Gross per 24 hour  ?Intake 960 ml  ?Output 1425 ml  ?Net -465 ml  ? ?  ? ?  ? ?Physical Exam: ?Vital Signs ?Blood pressure (!) 99/58, pulse 66, temperature 98.1 ?F (36.7 ?C), resp. rate 17, height '5\' 10"'$  (1.778 m), weight 117.3 kg, SpO2 96 %. ? ? ?General: No acute distress, in chair ?Mood and affect are appropriate, pleasant ?Heart: Regular rate and rhythm no rubs murmurs or extra sounds ?Lungs: Clear to auscultation, breathing non-labored ?Abdomen: Positive bowel sounds, soft nontender to palpation, nondistended ?Extremities: No clubbing, cyanosis, or edema ?Skin: No evidence of breakdown, no evidence of rash ? ? ?Neuro:  Alert and oriented , follows commands, insight normal, memory intact, speech normal ?Sensation intact to LT b/l UE, decreased sensation in RLE. CN2-12 grossly intact ?Musculoskeletal: R BKA with wound vac and shrinker, no sig drainage in vac ?Strength 5/5 in b/L UE and RLE ?Dupuytren contractures b/l hands ? ?Assessment/Plan: ?1. Functional deficits which require 3+ hours per day of interdisciplinary therapy in a comprehensive inpatient rehab setting. ?Physiatrist is providing close team supervision and 24 hour management of active medical problems listed below. ?Physiatrist and rehab team continue to assess barriers to discharge/monitor patient progress toward functional and medical goals ? ?Care Tool: ? ?Bathing ?   ?Body parts bathed by  patient: Right arm, Left arm, Chest, Abdomen, Front perineal area, Right upper leg, Left upper leg, Right lower leg, Left lower leg, Face, Buttocks  ?   ?  ?  ?Bathing assist Assist Level: Minimal Assistance - Patient > 75% (LE) ?  ?  ?Upper Body Dressing/Undressing ?Upper body dressing   ?What is the patient wearing?: [place] only ?   ?Upper body assist Assist Level: Minimal Assistance - Patient > 75% ?   ?Lower Body Dressing/Undressing ?Lower body dressing ? ? ?   ?What is the patient wearing?: Pants ? ?  ? ?Lower body assist Assist for lower body dressing: Minimal Assistance - Patient > 75% ?   ? ?Toileting ?Toileting    ?Toileting assist Assist for toileting: Minimal Assistance - Patient > 75% ?  ?  ?Transfers ?Chair/bed transfer ? ?Transfers assist ?   ? ?Chair/bed transfer assist level: Contact Guard/Touching assist ?  ?  ?Locomotion ?Ambulation ? ? ?Ambulation assist ? ?   ? ?Assist level: Contact Guard/Touching assist ?Assistive device: Walker-rolling ?Max distance: 59'  ? ?Walk 10 feet activity ? ? ?Assist ?   ? ?Assist level: Contact Guard/Touching assist ?Assistive device: Walker-rolling, Orthosis  ? ?Walk 50 feet activity ? ? ?  Assist Walk 50 feet with 2 turns activity did not occur: Safety/medical concerns ? ?  ?   ? ? ?Walk 150 feet activity ? ? ?Assist Walk 150 feet activity did not occur: Safety/medical concerns ? ?  ?  ?  ? ?Walk 10 feet on uneven surface  ?activity ? ? ?Assist Walk 10 feet on uneven surfaces activity did not occur: Safety/medical concerns ? ? ?  ?   ? ?Wheelchair ? ? ? ? ?Assist Is the patient using a wheelchair?: Yes ?Type of Wheelchair: Manual ?  ? ?Wheelchair assist level: Independent ?Max wheelchair distance: 150  ? ? ?Wheelchair 50 feet with 2 turns activity ? ? ? ?Assist ? ?  ?  ? ? ?Assist Level: Independent  ? ?Wheelchair 150 feet activity  ? ? ? ?Assist ?   ? ? ?Assist Level: Independent  ? ?Blood pressure (!) 99/58, pulse 66, temperature 98.1 ?F (36.7 ?C), resp. rate  17, height '5\' 10"'$  (1.778 m), weight 117.3 kg, SpO2 96 %. ? ?  ?Medical Problem List and Plan: ?1. 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           - He had strep bacteremia and septic shock with diabetic foot infection on omoxicillin ?            -patient may not shower due to wound vac ?            -ELOS/Goals: 9-12 days mod I to supervision with PT/OT estimated discharge 5/23 ? -Continue OT/PT ?           -Wound vac for about 1 week ?2.  Antithrombotics: ?-DVT/anticoagulation:  Pharmaceutical: Lovenox 60 mg daily ?            -antiplatelet therapy: Plavix and aspirin ?3. Pain Management: Tylenol, oxycodone, tramadol as needed ?           -Well controlled ?4. Mood: LCSW to evaluate and provide emotional support ?            -antipsychotic agents: n/a ?5. Neuropsych: This patient is capable of making decisions on his own behalf. ?6. Skin/Wound Care: Routine skin care checks ?7. Fluids/Electrolytes/Nutrition: Routine Is and Os and follow-up chemistries ?Need to monitor Na+ on sacubatril  ?8: Diabetes mellitus: CBGs 4 times daily ?-- 5/14 CGB a little elevated Semglee 40 units daily, will increase to 42 units ?            -- Continue meal coverage NovoLog 3 units with meals ?-- Continue sliding scale log with meals and bedtime ?--at home on weekly Mounjara; stopped Jardiance in February ?CBG (last 3)  ?Recent Labs  ?  03/24/22 ?2127 03/25/22 ?0355 03/25/22 ?1124  ?GLUCAP 74 114* 215*  ? ?5/17 CBG a little labile, continue to monitor ?9: Heart failure: Daily weight ?            -- Continue torsemide 20 mg twice daily ?            -- Continue Entresto 24/26 mg twice daily ?10: Hyperlipidemia: Continue Crestor, Lopid, Zetia, discussed healthy diet ?11: Peripheral arterial disease: Continue Plavix, aspirin, statin ?12: AKI atop chronic kidney disease: Serum creatinine continues downward trend and appears at baseline.   ?-Cr down to 1.36 today, improved, continue to follow with weekly labs ?13:  Coronary artery disease: Stable; continue heart meds, antiplatelet meds, antihyperlipidemia medications.  Jardiance, Imdur and Coreg currently held ?-No chest pain, follow ?14: Constipation: Continue senokot, miralax ?15:  Morbid obesity: dietary counseling, carb modified diet ?16: Essential hypertension: monitor TID/as needed.  Coreg and Jardiance currently held ? -Bps on the lower side for several days, overall stable and continue to monitor ?Vitals:  ? 03/24/22 1945 03/25/22 0525  ?BP: (!) 95/57 (!) 99/58  ?Pulse: 68 66  ?Resp: 17 17  ?Temp: 98.1 ?F (36.7 ?C) 98.1 ?F (36.7 ?C)  ?SpO2: 98% 96%  ? ?On entresto and demadex monitor for dizziness  ? ?17. Acute blood loss anemia ?           -Follow H/H  ?18. Insomnia  ? -trazadone and melatonin ? -5/16 increase melatonin to '10mg'$  ? -5/17 sleep improved, continue current medications ? ?LOS: ?4 days ?A FACE TO FACE EVALUATION WAS PERFORMED ? ?Jennye Boroughs ?03/25/2022, 7:43 AM  ? ?  ?

## 2022-03-25 NOTE — Discharge Instructions (Addendum)
Inpatient Rehab Discharge Instructions  Eduardo [name] Discharge date and time:  03/31/2022  Activities/Precautions/ Functional Status: Activity: no lifting, driving, or strenuous exercise until cleared by MD Diet: diabetic diet Wound Care: keep wound clean and dry Functional status:  ___ No restrictions     ___ Walk up steps independently ___ 24/7 supervision/assistance   ___ Walk up steps with assistance _x__ Intermittent supervision/assistance  ___ Bathe/dress independently ___ Walk with walker     __x_ Bathe/dress with assistance ___ Walk Independently    ___ Shower independently ___ Walk with assistance    _x__ Shower with assistance __x_ No alcohol     ___ Return to work/school ________  Special Instructions:  No driving, alcohol consumption or tobacco use.   COMMUNITY REFERRALS UPON DISCHARGE:    Home Health:   PT  & RN                Agency: [facility]    Phone: [phone]    Medical Equipment/Items Ordered:WHEELCHAIR TO GET BARIATRIC [name]                                                 Agency/Supplier:[facility]   [phone]    My questions have been answered and I understand these instructions. I will adhere to these goals and the provided educational materials after my discharge from the hospital.  Patient/Caregiver Signature _______________________________ Date __________  Clinician Signature _______________________________________ Date __________  Please bring this form and your medication list with you to all your follow-up doctor's appointments.

## 2022-03-25 NOTE — Progress Notes (Signed)
Physical Therapy Session Note ? ?Patient Details  ?Name: Eduardo [name] ?MRN: [id number] ?Date of Birth: November 03, 1956 ? ?Today's Date: 03/25/2022 ?PT Individual Time: 0109-3235 ?PT Individual Time Calculation (min): 75 min  ? ?Short Term Goals: ?Week 1:  PT Short Term Goal 1 (Week 1): =LTG due to ELOS. ? ?Skilled Therapeutic Interventions/Progress Updates:  ?   ?Pt supine in bed to start - agreeable to PT tx and denies pain. Donned limb guard without assist for R BKA. Supine<>sitting EOB mod I with hospital bed features. Sit<>stand to RW with CGA with x1 failed attempt resulting in sitting back to bed. Hopped ~52f to w/c with CGA and RW and then propelled himself mod I in w/c ~2071fto main rehab gym.  ? ?Squat<>pivot transfer completed to mat table with supervision - pt able to manage w/c parts without cues or assist and did well with positioning. Focused remainder of session on dynamic standing balance, RLE strengthening. 1x5 sit<>stands to RW with supervision. Standing hip abduction with back of arm chair for LUE support 2x10. Added green TB for remaining 2x10 sets of same there-ex. Standing chest press with 5lb dowel rod without BUE support - minA needed for steadying - pt reported this as very challenging. Sit>supine to mat table with supervision. Completed supine there-ex with green TB around distal femur, bridges + R hip abduction 2x8. Pt experiencing some mild cramping on L hamstring that eases with rest. Squat<>pivot transfer back to w/c with CGA for safety due to fatigue/cramping.  ? ?Propelled himself mod I back to room and then ambulatory (hopped) transfer ~1060fithin room with CGA and RW. Concluded session seated in recliner, RN entering room to remove wound vac. All needs met, call bell in reach.  ? ?Therapy Documentation ?Precautions:  ?Precautions ?Precautions: Fall (R BKA) ?Precaution Comments: R BKA ?Required Braces or Orthoses: Other Brace ?Other Brace: limb protector ?Restrictions ?Weight Bearing  Restrictions: Yes ?RLE Weight Bearing: Non weight bearing ?General: ?  ? ?Therapy/Group: Individual Therapy ? ?[name] ?03/25/2022, 7:51 AM  ?

## 2022-03-26 DIAGNOSIS — M79604 Pain in right leg: Secondary | ICD-10-CM

## 2022-03-26 LAB — GLUCOSE, CAPILLARY
Glucose-Capillary: 84 mg/dL (ref 70–99)
Glucose-Capillary: 162 mg/dL — ABNORMAL HIGH (ref 70–99)
Glucose-Capillary: 119 mg/dL — ABNORMAL HIGH (ref 70–99)
Glucose-Capillary: 55 mg/dL — ABNORMAL LOW (ref 70–99)
Glucose-Capillary: 65 mg/dL — ABNORMAL LOW (ref 70–99)
Glucose-Capillary: 86 mg/dL (ref 70–99)

## 2022-03-26 NOTE — Progress Notes (Signed)
Physical Therapy Session Note  Patient Details  Name: Eduardo [name] MRN: [id number] Date of Birth: Aug 28, 1956  Today's Date: 03/26/2022 PT Individual Time: 1100-1159 PT Individual Time Calculation (min): 59 min   Short Term Goals: Week 1:  PT Short Term Goal 1 (Week 1): =LTG due to ELOS.  Skilled Therapeutic Interventions/Progress Updates:    Pt presents up in recliner. Discussed wound vac being off yesterday, use of shrinker sock, desensitzation techniques, monitoring of incision site (pt has seen and looked at his wound at this time), and how edema can change fit of sock and/or limb guard. Pt donned limb guard independently to prepare for therapy session.   Performed ambulatory transfer with RW from recliner with supervision to w/c x 12' with safe transfer technique and good awareness and positioning of RW. Propelled w/c on unit > 150' with BUE for functional mobility, endurance, and general UE strengthening.   Squat pivot transfer to mat table with supervision and pt managing legrests independently during all transfers.  Engaged in dynamic standing balance activity and stepping up/down off of 2" compliant surface with RW to challenge balance strategies and work towards stair negotiation. Pt able to maintain dynamic standing balance up to about 1-2 min total with decreased reliance on UE's and even able to take both hands off with min assist for balance on compliant surface about 10 seconds at a time. Educated on importance of balance retraining.  In ADL apartment performed furniture transfers including low recliner  for practice (required mod assist for sit > stand due to low surface but reports his chair at home sits much higher and was not sitting in low surfaces prior to this anyway) and supervision to the bed (his bed height is taller, so talked through set up at home as well). Gait on carpeted surface with RW with CGA about 20' total around the room. Discussed kitchen set up and use of RW  vs w/c and realistic based upon needs for carrying items, etc.   Pt overall demonstrating good insight and problem solving into home set-up. Reports grab bars are being installed in bathroom and shower tomorrow. Also verbally discussed this and would benefit in future session from practicing the step over threshold with RW.   End of session returned to room and to recliner with RW with close supervision. Handoff to RN.  Therapy Documentation Precautions:  Precautions Precautions: Fall (R BKA) Precaution Comments: R BKA Required Braces or Orthoses: Other Brace Other Brace: limb protector Restrictions Weight Bearing Restrictions: No RLE Weight Bearing: Non weight bearing   Pain: No complaints of pain. Reports not sleeping well and just feeling a little "blah" today.    Therapy/Group: Individual Therapy  Canary Brim Ivory Broad, PT, DPT, CBIS  03/26/2022, 12:04 PM

## 2022-03-26 NOTE — Progress Notes (Signed)
PROGRESS NOTE   Subjective/Complaints:  Poor sleep last night. Pain well controlled. Wound vac was removed.   ROS  NO CP, SOB, N/V/D/C. No HA. No vision changes.   Objective:   No results found. Recent Labs    03/23/22 0813  WBC 5.5  HGB 12.0*  HCT 37.5*  PLT 303    Recent Labs    03/23/22 0813  NA 136  K 4.1  CL 106  CO2 23  GLUCOSE 118*  BUN 31*  CREATININE 1.36*  CALCIUM 8.4*     Intake/Output Summary (Last 24 hours) at 03/26/2022 0735 Last data filed at 03/25/2022 2211 Gross per 24 hour  Intake 714 ml  Output 1375 ml  Net -661 ml         Physical Exam: Vital Signs Blood pressure (!) 96/58, pulse 62, temperature 98.5 F (36.9 C), resp. rate 14, height '5\' 10"'$  (1.778 m), weight 117.5 kg, SpO2 97 %.   General: No acute distress, in chair Mood and affect are appropriate, pleasant Heart: Regular rate and rhythm no rubs murmurs or extra sounds Lungs: Clear to auscultation, breathing non-labored Abdomen: Positive bowel sounds, soft nontender to palpation, nondistended Extremities: No clubbing, cyanosis, or edema Skin: No evidence of breakdown, no evidence of rash   Neuro:  Alert and oriented , follows commands, insight normal, memory intact, speech normal Sensation intact to LT b/l UE, decreased sensation in RLE. CN2-12 grossly intact Musculoskeletal: R BKA with stump shrinker, wound vacwas removed Strength 5/5 in b/L UE and RLE Dupuytren contractures b/l hands  Assessment/Plan: 1. Functional deficits which require 3+ hours per day of interdisciplinary therapy in a comprehensive inpatient rehab setting. Physiatrist is providing close team supervision and 24 hour management of active medical problems listed below. Physiatrist and rehab team continue to assess barriers to discharge/monitor patient progress toward functional and medical goals  Care Tool:  Bathing    Body parts bathed by  patient: Right arm, Left arm, Chest, Abdomen, Front perineal area, Right upper leg, Left upper leg, Right lower leg, Left lower leg, Face, Buttocks         Bathing assist Assist Level: Minimal Assistance - Patient > 75% (LE)     Upper Body Dressing/Undressing Upper body dressing   What is the patient wearing?: Hospital gown only    Upper body assist Assist Level: Minimal Assistance - Patient > 75%    Lower Body Dressing/Undressing Lower body dressing      What is the patient wearing?: Pants     Lower body assist Assist for lower body dressing: Minimal Assistance - Patient > 75%     Toileting Toileting    Toileting assist Assist for toileting: Minimal Assistance - Patient > 75%     Transfers Chair/bed transfer  Transfers assist     Chair/bed transfer assist level: Contact Guard/Touching assist     Locomotion Ambulation   Ambulation assist      Assist level: Contact Guard/Touching assist Assistive device: Walker-rolling Max distance: 20'   Walk 10 feet activity   Assist     Assist level: Contact Guard/Touching assist Assistive device: Walker-rolling, Orthosis   Walk 50 feet activity   Assist  Walk 50 feet with 2 turns activity did not occur: Safety/medical concerns         Walk 150 feet activity   Assist Walk 150 feet activity did not occur: Safety/medical concerns         Walk 10 feet on uneven surface  activity   Assist Walk 10 feet on uneven surfaces activity did not occur: Safety/medical concerns         Wheelchair     Assist Is the patient using a wheelchair?: Yes Type of Wheelchair: Manual    Wheelchair assist level: Independent Max wheelchair distance: 150    Wheelchair 50 feet with 2 turns activity    Assist        Assist Level: Independent   Wheelchair 150 feet activity     Assist      Assist Level: Independent   Blood pressure (!) 96/58, pulse 62, temperature 98.5 F (36.9 C), resp. rate  14, height '5\' 10"'$  (1.778 m), weight 117.5 kg, SpO2 97 %.    Medical Problem List and Plan: 1. Functional deficits secondary to right below the knee amputation            - He had strep bacteremia and septic shock with diabetic foot infection on omoxicillin             -patient may not shower due to wound vac             -ELOS/Goals: 9-12 days mod I to supervision with PT/OT estimated discharge 5/23  -Continue OT/PT            -Wound vac removed today 2.  Antithrombotics: -DVT/anticoagulation:  Pharmaceutical: Lovenox 60 mg daily             -antiplatelet therapy: Plavix and aspirin 3. Pain Management: Tylenol, oxycodone, tramadol as needed            -5/18 pain well controlled 4. Mood: LCSW to evaluate and provide emotional support             -antipsychotic agents: n/a 5. Neuropsych: This patient is capable of making decisions on his own behalf. 6. Skin/Wound Care: Routine skin care checks 7. Fluids/Electrolytes/Nutrition: Routine Is and Os and follow-up chemistries Need to monitor Na+ on sacubatril  8: Diabetes mellitus: CBGs 4 times daily -- 5/14 CGB a little elevated Semglee 40 units daily, will increase to 42 units             -- Continue meal coverage NovoLog 3 units with meals -- Continue sliding scale log with meals and bedtime --at home on weekly Mounjara; stopped Jardiance in February CBG (last 3)  Recent Labs    03/25/22 1621 03/25/22 2105 03/26/22 0604  GLUCAP 116* 76 84    5/18- CBGs a little labile, may need a little less semglee and a little more meal coverage 9: Heart failure: Daily weight             -- Continue torsemide 20 mg twice daily             -- Continue Entresto 24/26 mg twice daily 10: Hyperlipidemia: Continue Crestor, Lopid, Zetia, discussed healthy diet 11: Peripheral arterial disease: Continue Plavix, aspirin, statin 12: AKI atop chronic kidney disease: Serum creatinine continues downward trend and appears at baseline.   -Cr down to 1.36 today,  improved, continue to follow with weekly labs 13: Coronary artery disease: Stable; continue heart meds, antiplatelet meds, antihyperlipidemia medications.  Jardiance, Imdur and Coreg currently held -No  chest pain, follow 14: Constipation: Continue senokot, miralax 15: Morbid obesity: dietary counseling, carb modified diet 16: Essential hypertension: monitor TID/as needed.  Coreg and Jardiance currently held  -Bps on the lower side for several days, overall stable and continue to monitor Vitals:   03/25/22 1953 03/26/22 0525  BP: 104/62 (!) 96/58  Pulse: 69 62  Resp: 18 14  Temp: 98.4 F (36.9 C) 98.5 F (36.9 C)  SpO2: 97% 97%   On entresto and demadex monitor for dizziness   17. Acute blood loss anemia            -Follow H/H   18. Insomnia   -trazadone and melatonin  -5/16 increase melatonin to '10mg'$   -5/17 sleep improved, continue current medications  -5/18 advised to try trazodone PRN for sleep  LOS: 5 days A FACE TO FACE EVALUATION WAS PERFORMED  Jennye Boroughs 03/26/2022, 7:35 AM

## 2022-03-26 NOTE — Significant Event (Signed)
Hypoglycemic Event  CBG: 65  Treatment: 8 oz juice/soda  Symptoms: None  Follow-up CBG: Time:2116 CBG Result:55  Pt drank more juice, Rechecked at 2133, CBG:86   Possible Reasons for Event: Unknown  Comments/MD notified:    Celesta Aver

## 2022-03-26 NOTE — Progress Notes (Signed)
Physical Therapy Session Note  Patient Details  Name: Eduardo [name] MRN: [id number] Date of Birth: [birth year]  Today's Date: 03/26/2022 PT Individual Time: 8309-4076 PT Individual Time Calculation (min): 57 min   Short Term Goals: Week 1:  PT Short Term Goal 1 (Week 1): =LTG due to ELOS.  Skilled Therapeutic Interventions/Progress Updates:   Received pt sitting in recliner with NT present checking vitals. Pt agreeable to PT treatment and denied any pain during session. Session with emphasis on functional mobility/transfers, generalized strengthening and endurance, dynamic standing balance/coordination, and WC management. Donned limb guard independently and pt transferred sit<>stand with RW and supervision and ambulated 54f with RW and CGA/close supervision to [place] Donned legrests with set up assist and performed WC mobility 155fx 2 trials using BUE mod I to/from therapy gym. Pt transferred on/off Nustep with RW and supervision and performed BUE and LLE strengthening on Nustep at workload 6 decreasing to workload 5 for 8 minutes for a total of 488 steps (steps per minute >60) with emphasis on cardiovascular endurance - pt required 1 rest break due to UE fatigue/SOB. Pt reports his biggest concern is if his girlfriend will be able to fold up the [facility] put it in the car - but planning on outing tomorrow and family education prior to discharge. Pt transferred on/off mat via squat<>pivot with supervision - cues for eccentric control and performed the following exercises: -tricep extension on yoga blocks 2x8 -seated trunk rotation with 5lb dumbbell x10 bilaterally -seated isometric shoulder extensions into physioball 10x5 second hold to isolate abdominals -shoulder extension<>driving the car with 5lb dumbbell x8 Returned to room and ambulated 1524fith RW and supervision back to recliner. Concluded session with pt sitting in recliner with all needs within reach.   Therapy Documentation Precautions:   Precautions Precautions: Fall (R BKA) Precaution Comments: R BKA Required Braces or Orthoses: Other Brace Other Brace: limb protector Restrictions Weight Bearing Restrictions: Yes RLE Weight Bearing: Non weight bearing  Therapy/Group: Individual Therapy AnnAlfonse Alpers, DPT  03/26/2022, 6:54 AM

## 2022-03-26 NOTE — Plan of Care (Signed)
  Problem: RH Stairs Goal: LTG Patient will ambulate up and down stairs w/assist (PT) Description: LTG: Patient will ambulate up and down # of stairs with assistance (PT) Outcome: Not Applicable Flowsheets (Taken 03/26/2022 1212) LTG: Pt will ambulate up/down stairs assist needed:: (d/c due to having ramp installed) -- Note: D/c due to having ramp installed   Problem: RH Balance Goal: LTG Patient will maintain dynamic standing balance (PT) Description: LTG:  Patient will maintain dynamic standing balance with assistance during mobility activities (PT) Flowsheets (Taken 03/26/2022 1212) LTG: Pt will maintain dynamic standing balance during mobility activities with:: (Upgraded) Independent with assistive device  Note: Upgraded    Problem: Sit to Stand Goal: LTG:  Patient will perform sit to stand with assistance level (PT) Description: LTG:  Patient will perform sit to stand with assistance level (PT) Flowsheets (Taken 03/26/2022 1212) LTG: PT will perform sit to stand in preparation for functional mobility with assistance level: (upgraded) Independent with assistive device Note: Upgraded    Problem: RH Bed to Chair Transfers Goal: LTG Patient will perform bed/chair transfers w/assist (PT) Description: LTG: Patient will perform bed to chair transfers with assistance (PT). Flowsheets (Taken 03/26/2022 1212) LTG: Pt will perform Bed to Chair Transfers with assistance level: (Upgraded) Independent with assistive device  Note: Upgraded    Problem: RH Ambulation Goal: LTG Patient will ambulate in controlled environment (PT) Description: LTG: Patient will ambulate in a controlled environment, # of feet with assistance (PT). Flowsheets (Taken 03/26/2022 1212) LTG: Ambulation distance in controlled environment: (decreased due to endurance) 25' Goal: LTG Patient will ambulate in home environment (PT) Description: LTG: Patient will ambulate in home environment, # of feet with assistance  (PT). Flowsheets (Taken 03/26/2022 1212) LTG: Pt will ambulate in home environ  assist needed:: (decreased due to endurance) -- LTG: Ambulation distance in home environment: 9'

## 2022-03-26 NOTE — Progress Notes (Signed)
Recreational Therapy Assessment and Plan  Patient Details  Name: Eduardo [name] MRN: [id number] Date of Birth: [birth year] Today's Date: 03/26/2022  Rehab Potential:  Good ELOS:   d/c 5/23  Assessment  Hospital Problem: Principal Problem:   S/P BKA (below knee amputation), right [facility])     Past Medical History:      Past Medical History:  Diagnosis Date   CHF (congestive heart failure) ([place])     Chronic kidney disease      stage 3   Colon polyp     Coronary artery disease     Diabetes mellitus 1987    under care of Dr. Chalmers Cater.  On insulin since 96 (off and on)   Diabetic retinopathy     Dupuytren contracture      R hand, s/p injection (Dr. Lenon Curt)   Essential hypertension, benign     Essential hypertension, benign 02/06/2019   Frequency of urination and polyuria     Hypertension     Myocardial infarction [facility])      denies   Neuromuscular disorder ([place])      Diabetic neuropathy   Osteomyelitis ([place])      right foot   Other testicular hypofunction     Peripheral arterial disease ([place]) 10/28/2012   Peritoneal abscess ([place]) 6/08    and buttock.   Pneumonia     Polydipsia     Proteinuria     Pure hyperglyceridemia     Subacute osteomyelitis, right ankle and foot (H[facility])     Wears glasses      Past Surgical History:       Past Surgical History:  Procedure Laterality Date   ABDOMINAL AORTAGRAM N/A 04/18/2012    Procedure: ABDOMINAL Maxcine Ham;  Surgeon: Angelia Mould, MD;  Location: [facility] CATH LAB;  Service: Cardiovascular;  Laterality: N/A;   AMPUTATION Right 05/19/2019    Procedure: RIGHT FOOT 5TH RAY AMPUTATION;  Surgeon: Newt Minion, MD;  Location: [place];  Service: Orthopedics;  Laterality: Right;   AMPUTATION Right 03/11/2022    Procedure: RIGHT LEG DEBRIDEMENT VS. BELOW KNEE AMPUTATION;  Surgeon: Newt Minion, MD;  Location: [place];  Service: Orthopedics;  Laterality: Right;   CARDIAC CATHETERIZATION N/A 09/08/2016    Procedure: Left Heart Cath and Coronary  Angiography;  Surgeon: [place] Prows, MD;  Location: [place] CV LAB;  Service: Cardiovascular;  Laterality: N/A;   CATARACT EXTRACTION, BILATERAL   09/2017, 10/2017    Dr. Herbert Deaner   COLONOSCOPY W/ BIOPSIES AND POLYPECTOMY       CORONARY/GRAFT ACUTE MI REVASCULARIZATION N/A 10/11/2020    Procedure: Coronary/Graft Acute MI Revascularization;  Surgeon: [place] Prows, MD;  Location: [place] CV LAB;  Service: Cardiovascular;  Laterality: N/A;   I & D EXTREMITY Right 03/11/2022    Procedure: BELOW KNEE AMPUTATION;  Surgeon: Newt Minion, MD;  Location: [place];  Service: Orthopedics;  Laterality: Right;   LEFT HEART CATH N/A 12/31/2020    Procedure: Left Heart Cath;  Surgeon: [place] Prows, MD;  Location: [place] CV LAB;  Service: Cardiovascular;  Laterality: N/A;   LEFT HEART CATH AND CORONARY ANGIOGRAPHY N/A 10/11/2020    Procedure: LEFT HEART CATH AND CORONARY ANGIOGRAPHY;  Surgeon: [place] Prows, MD;  Location: [place] CV LAB;  Service: Cardiovascular;  Laterality: N/A;   LOWER EXTREMITY ANGIOGRAM Bilateral 04/18/2012    Procedure: LOWER EXTREMITY ANGIOGRAM;  Surgeon: Angelia Mould, MD;  Location: [facility] CATH LAB;  Service: Cardiovascular;  Laterality: Bilateral;  bilat  lower extrem angio   LOWER EXTREMITY ANGIOGRAPHY Bilateral 05/02/2019    Procedure: LOWER EXTREMITY ANGIOGRAPHY;  Surgeon: [place] Prows, MD;  Location: [place] CV LAB;  Service: Cardiovascular;  Laterality: Bilateral;   LOWER EXTREMITY ANGIOGRAPHY Bilateral 02/28/2019    Procedure: LOWER EXTREMITY ANGIOGRAPHY;  Surgeon: [place] Prows, MD;  Location: [place] CV LAB;  Service: Cardiovascular;  Laterality: Bilateral;   macular photocoagulation        (eye treatments for diabetic retinopathy)-Dr. Zigmund Daniel   PERIPHERAL VASCULAR INTERVENTION   02/28/2019    Procedure: PERIPHERAL VASCULAR INTERVENTION;  Surgeon: [place] Prows, MD;  Location: [place] CV LAB;  Service: Cardiovascular;;   TEE WITHOUT CARDIOVERSION N/A 03/18/2022     Procedure: TRANSESOPHAGEAL ECHOCARDIOGRAM (TEE);  Surgeon: [place] Prows, MD;  Location: [facility] ENDOSCOPY;  Service: Cardiovascular;  Laterality: N/A;   VENTRICULAR ASSIST DEVICE INSERTION N/A 12/31/2020    Procedure: VENTRICULAR ASSIST DEVICE INSERTION;  Surgeon: [place] Prows, MD;  Location: [place] CV LAB;  Service: Cardiovascular;  Laterality: N/A;      Assessment & Plan Clinical Impression:  Eduardo [name] is a 66 year old male who presented to the emergency department on 03/08/2022 with complaints of fever, chills malaise and painful swollen right foot wound for several days.  He was hypotensive and required Levophed.  Admitted to the ICU on broad-spectrum antibiotics.  His past medical history significant for diabetes mellitus and peripheral arterial disease.  Orthopedic surgery consulted and noted exposed bone at the base of the fifth metatarsal and cellulitis extending to the peroneal tendons.  An MRI showed osteomyelitis and tenosynovitis.  He stabilized and was taken to the operating room on 5/3 for right below the knee amputation by Dr. Sharol Given.  Diuresis started on 5/5 for volume overload secondary to IV fluid resuscitation.  He was transitioned to oral diuretics.  Infectious disease consultation obtained and he underwent TTE.  Antibiotics were narrowed to penicillin.  No evidence of endocarditis.  Blood cultures were positive for Streptococcus anginosus.  Repeat blood cultures are negative.  He will complete 2-week course of antibiotics on 5/13.The patient requires inpatient physical medicine and rehabilitation evaluations and treatment secondary to dysfunction due to right below the knee amputation.   Patient's medical history is also significant for acute on chronic combined systolic and diastolic congestive heart failure.  His ejection fraction is estimated at 20 to 25%.  He will continue on home torsemide and Entresto. History of bilateral Dupuytren hand contractions.  Pt presents with decreased  activity tolerance, decreased functional mobility, decreased balance Limiting pt's independence with leisure/community pursuits.  Met with pt today to discuss TR services including leisure education, activity analysis/modifications, stress management, community reintegration & discharge planning.  Also discussed the importance of social, emotional, spiritual health in addition to physical health and their effects on overall health and wellness.  Pt stated understanding.  Further discussion on the purpose of community reintegration/outing and pt is agreeable to an outing tomorrow.    Plan  Min 1 session for >60 minutes for community reintegraiton  Recommendations for other services: None   Discharge Criteria: Patient will be discharged from TR if patient refuses treatment 3 consecutive times without medical reason.  If treatment goals not met, if there is a change in medical status, if patient makes no progress towards goals or if patient is discharged from hospital.  The above assessment, treatment plan, treatment alternatives and goals were discussed and mutually agreed upon: by patient  [place] 03/26/2022, 3:16 PM

## 2022-03-26 NOTE — Progress Notes (Signed)
Occupational Therapy Session Note  Patient Details  Name: Eduardo [name] MRN: [id number] Date of Birth: 10/19/1956  Today's Date: 03/26/2022 OT Individual Time: 1000-1030 OT Individual Time Calculation (min): 30 min    Short Term Goals: Week 1:  STG's=LTG's due to LOS   Skilled Therapeutic Interventions/Progress Updates:  Pt seen for AM session with focus on toileting routine and RW safety training. Pt seated up in recliner with LE's elevated and R shrinker in place with wound vac discontinued yesterday afternoon. Pt reports no pain but had some difficulty sleeping last night but has since spoken to MD for changes in sleep medications. Pt open to shower training at later longer OT session this afternoon but requests to work on RW amb to and from toilet for BM this session. Sit to stand with close S, amb with RW hopping on L LE with CGA but min A for small incline in/out of bathroom threshold to toilet with grab bars. Min A LB garment management with steadying support of L side of wall and CG A for wiping post BM. Stood with min A/CGA for sink side hand washing with steadying assist. Once back to recliner, pt left with needs and call bell in place and LE's elevated.     Therapy/Group: Individual Therapy  [name] 03/26/2022, 12:52 PM

## 2022-03-26 NOTE — Progress Notes (Signed)
Occupational Therapy Session Note  Patient Details  Name: Eduardo [name] MRN: [id number] Date of Birth: 02/24/1956  Today's Date: 03/26/2022 OT Individual Time: 1300-1400 OT Individual Time Calculation (min): 60 min    Short Term Goals: Week 1:   STG=LTG's due to LOS   Skilled Therapeutic Interventions/Progress Updates:  Pt seen for OT this pm for full self care training session. Pt seated finished lunch tray in recliner and agreeable to showering for the 1st time since surgery. Pt amb with RW via hopping on L LE to stall shower with shower bench and grab bar in place and CGA except min A for small incline at bathroom door threshold. Once seated on bench, OT assisted pt to doff LB clothing including sock and shoes with min A due to sit to stand challenge in stall. OT covered R residual limb with waterproofing. Pt completed UB bathing in shower seated with close S and LB bathing with min A to reach L LE and buttocks. Transferred to w/c with min A due to slick surfaces then completed all dressing at sink side with pt demonstrating ability to perform crossed leg technique to don LB garments, sock and tennis shoe then CGA to pull up garments with RW support alternating UE hand support to maintain balance on L LE. Pt then able to return to recliner for rest before final PT session. LE's elevated and needs and call bell left within reach.    Therapy Documentation Precautions:  Precautions Precautions: Fall (R BKA) Precaution Comments: R BKA Required Braces or Orthoses: Other Brace Other Brace: limb protector Restrictions Weight Bearing Restrictions: No RLE Weight Bearing: Non weight bearing    Therapy/Group: Individual Therapy  [name] 03/26/2022, 3:16 PM

## 2022-03-27 DIAGNOSIS — G4700 Insomnia, unspecified: Secondary | ICD-10-CM

## 2022-03-27 DIAGNOSIS — K5900 Constipation, unspecified: Secondary | ICD-10-CM

## 2022-03-27 LAB — GLUCOSE, CAPILLARY
Glucose-Capillary: 116 mg/dL — ABNORMAL HIGH (ref 70–99)
Glucose-Capillary: 267 mg/dL — ABNORMAL HIGH (ref 70–99)
Glucose-Capillary: 66 mg/dL — ABNORMAL LOW (ref 70–99)
Glucose-Capillary: 92 mg/dL (ref 70–99)
Glucose-Capillary: 115 mg/dL — ABNORMAL HIGH (ref 70–99)

## 2022-03-27 MED ORDER — INSULIN GLARGINE-YFGN 100 UNIT/ML ~~LOC~~ SOLN
38.0000 [IU] | Freq: Every day | SUBCUTANEOUS | Status: DC
  Administered 2022-03-28: 38 [IU] via SUBCUTANEOUS
  Filled 2022-03-27: qty 0.38

## 2022-03-27 NOTE — Progress Notes (Signed)
PROGRESS NOTE   Subjective/Complaints:  No new complaints or concerns.  Patient reports better sleep after using trazodone last night.  ROS  NO CP, SOB, N/V/D/C. No HA.  No fevers chills  Objective:   No results found. No results for input(s): WBC, HGB, HCT, PLT in the last 72 hours.  No results for input(s): NA, K, CL, CO2, GLUCOSE, BUN, CREATININE, CALCIUM in the last 72 hours.   Intake/Output Summary (Last 24 hours) at 03/27/2022 0703 Last data filed at 03/27/2022 0520 Gross per 24 hour  Intake 240 ml  Output 1125 ml  Net -885 ml         Physical Exam: Vital Signs Blood pressure 94/60, pulse 66, temperature 98.3 F (36.8 C), temperature source Oral, resp. rate 17, height '5\' 10"'$  (1.778 m), weight 117 kg, SpO2 96 %.   General: No acute distress, in bed Mood and affect are appropriate, pleasant Heart: Regular rate and rhythm no rubs murmurs or extra sounds Lungs: Clear to auscultation, breathing non-labored Abdomen: Positive bowel sounds, soft nontender to palpation, nondistended Extremities: No clubbing, cyanosis, or edema Skin: Warm and dry   Neuro:  Alert and oriented , follows commands, insight normal, memory intact, speech normal Sensation intact to LT b/l UE, decreased sensation in RLE. CN2-12 grossly intact Musculoskeletal: R BKA with stump shrinker Strength 5/5 in b/L UE and RLE Dupuytren contractures b/l hands  Assessment/Plan: 1. Functional deficits which require 3+ hours per day of interdisciplinary therapy in a comprehensive inpatient rehab setting. Physiatrist is providing close team supervision and 24 hour management of active medical problems listed below. Physiatrist and rehab team continue to assess barriers to discharge/monitor patient progress toward functional and medical goals  Care Tool:  Bathing    Body parts bathed by patient: Right arm, Left arm, Chest, Abdomen, Front perineal  area, Right upper leg, Left upper leg, Left lower leg, Face, Buttocks     Body parts n/a: Right lower leg   Bathing assist Assist Level: Minimal Assistance - Patient > 75%     Upper Body Dressing/Undressing Upper body dressing   What is the patient wearing?: Pull over shirt    Upper body assist Assist Level: Set up assist    Lower Body Dressing/Undressing Lower body dressing      What is the patient wearing?: Pants, Underwear/pull up     Lower body assist Assist for lower body dressing: Contact Guard/Touching assist     Toileting Toileting    Toileting assist Assist for toileting: Contact Guard/Touching assist     Transfers Chair/bed transfer  Transfers assist     Chair/bed transfer assist level: Supervision/Verbal cueing     Locomotion Ambulation   Ambulation assist      Assist level: Contact Guard/Touching assist Assistive device: Walker-rolling Max distance: 20'   Walk 10 feet activity   Assist     Assist level: Supervision/Verbal cueing Assistive device: Walker-rolling, Orthosis   Walk 50 feet activity   Assist Walk 50 feet with 2 turns activity did not occur: Safety/medical concerns         Walk 150 feet activity   Assist Walk 150 feet activity did not occur: Safety/medical concerns  Walk 10 feet on uneven surface  activity   Assist Walk 10 feet on uneven surfaces activity did not occur: Safety/medical concerns         Wheelchair     Assist Is the patient using a wheelchair?: Yes Type of Wheelchair: Manual    Wheelchair assist level: Independent Max wheelchair distance: 150    Wheelchair 50 feet with 2 turns activity    Assist        Assist Level: Independent   Wheelchair 150 feet activity     Assist      Assist Level: Independent   Blood pressure 94/60, pulse 66, temperature 98.3 F (36.8 C), temperature source Oral, resp. rate 17, height '5\' 10"'$  (1.778 m), weight 117 kg, SpO2 96  %.    Medical Problem List and Plan: 1. Functional deficits secondary to right below the knee amputation            - He had strep bacteremia and septic shock with diabetic foot infection on omoxicillin             -patient may not shower due to wound vac             -ELOS/Goals: 9-12 days mod I to supervision with PT/OT estimated discharge 5/23  -Continue OT/PT  -He was able to walk about 20 feet with RW and CGA            -Wound vac removed 5/18 2.  Antithrombotics: -DVT/anticoagulation:  Pharmaceutical: Lovenox 60 mg daily             -antiplatelet therapy: Plavix and aspirin 3. Pain Management: Tylenol, oxycodone, tramadol as needed            -5/18 pain well controlled 4. Mood: LCSW to evaluate and provide emotional support             -antipsychotic agents: n/a 5. Neuropsych: This patient is capable of making decisions on his own behalf. 6. Skin/Wound Care: Routine skin care checks 7. Fluids/Electrolytes/Nutrition: Routine Is and Os and follow-up chemistries Need to monitor Na+ on sacubatril  8: Diabetes mellitus: CBGs 4 times daily -- 5/14 CGB a little elevated Semglee 40 units daily, will increase to 42 units             -- Continue meal coverage NovoLog 3 units with meals -- Continue sliding scale log with meals and bedtime --at home on weekly Mounjara; stopped Jardiance in February CBG (last 3)  Recent Labs    03/26/22 2116 03/26/22 2133 03/27/22 0549  GLUCAP 55* 86 116*    5/18- CBGs a little labile, may need a little less semglee and a little more meal coverage 5/19- CBG had low CBG last night, will decrease semglee to 38u 9: Heart failure: Daily weight             -- Continue torsemide 20 mg twice daily             -- Continue Entresto 24/26 mg twice daily 10: Hyperlipidemia: Continue Crestor, Lopid, Zetia, discussed healthy diet 11: Peripheral arterial disease: Continue Plavix, aspirin, statin 12: AKI atop chronic kidney disease: Serum creatinine continues  downward trend and appears at baseline.   -Cr down to 1.36 today, improved, continue to follow with weekly labs 13: Coronary artery disease: Stable; continue heart meds, antiplatelet meds, antihyperlipidemia medications.  Jardiance, Imdur and Coreg currently held -No chest pain, follow 14: Constipation: Continue senokot, miralax  -Improved, BM 5/19, continue regimen 15: Morbid obesity:  dietary counseling, carb modified diet 16: Essential hypertension: monitor TID/as needed.  Coreg and Jardiance currently held  -Bps on the lower side for several days, overall stable and continue to monitor Vitals:   03/26/22 1931 03/27/22 0522  BP: 101/60 94/60  Pulse: 68 66  Resp: 17 17  Temp: 98.4 F (36.9 C) 98.3 F (36.8 C)  SpO2: 97% 96%   On entresto and demadex monitor for dizziness   17. Acute blood loss anemia            -Follow H/H   18. Insomnia   -trazadone and melatonin  -5/16 increase melatonin to '10mg'$   -5/17 sleep improved, continue current medications  -5/18 advised to try trazodone PRN for sleep  -5/19 Sleep improved with trazodone, continue  LOS: 6 days A FACE TO FACE EVALUATION WAS PERFORMED  Jennye Boroughs 03/27/2022, 7:03 AM

## 2022-03-27 NOTE — Progress Notes (Signed)
The patient is a 66 year old gentleman who is seen today in inpatient rehab.  He is up in a wheelchair with occupational therapy.  His right residual limb is consolidating well there are staples in place this is well-healed we will plan to harvest the staples tomorrow.

## 2022-03-27 NOTE — Progress Notes (Signed)
Hypoglycemic Event  CBG: 66  Treatment: 4 oz juice/soda  Symptoms: Shaky and Nervous/irritable  Follow-up CBG: Time:1607 CBG Result:92  Possible Reasons for Event: medication regimen   Comments/MD notified:Dan PA-C    Merwyn Katos

## 2022-03-27 NOTE — Patient Instructions (Signed)
1) Strengthening: Chest Pull - Resisted   Hold Theraband in front of body with hands about shoulder width a part. Pull band a part and back together slowly. Repeat _10___ times.   Copyright  VHI. All rights reserved.   2) PNF Strengthening: Resisted   Standing with resistive band around each hand, bring right arm up and away, thumb back. Repeat __10__ times per set.   3) Resisted External Rotation: in Neutral - Bilateral   Sit or stand, tubing in both hands, elbows at sides, bent to 90, forearms forward. Pinch shoulder blades together and rotate forearms out. Keep elbows at sides. Repeat __10__ times per set.  http://orth.exer.us/966   Copyright  VHI. All rights reserved.   4) PNF Strengthening: Resisted   Standing, hold resistive band above head. Bring right arm down and out from side. Repeat _10___ times per set.  http://orth.exer.us/922   Copyright  VHI. All rights reserved.

## 2022-03-27 NOTE — Progress Notes (Signed)
Recreational Therapy Discharge Summary Patient Details  Name: Eduardo [name] MRN: [id number] Date of Birth: 04-28-1956 Today's Date: 03/27/2022  Long term goals set: 1  Long term goals met: 1  Comments on progress toward goals: TR sessions focused on leisure education, activity analysis/modifications, stress management & community reintegration.  Pt had made great progress during LOS and met min assist goal for community reintegration.  Reasons for discharge: discharge from hospital  Follow-up: [place] agrees with progress made and goals achieved: Yes  Meriel Kelliher 03/27/2022, 12:53 PM

## 2022-03-27 NOTE — Progress Notes (Signed)
Occupational Therapy Session Note  Patient Details  Name: VICKI PASQUAL MRN: [id number] Date of Birth: 16-Jun-1956  Today's Date: 03/27/2022 OT Individual Time: 1440-1515 OT Individual Time Calculation (min): 35 min    Short Term Goals: Week 1:   STG's=LTGs due to ELOS  Skilled Therapeutic Interventions/Progress Updates:    Self care management completed while discussing home environment including bathroom set-up. Discussed current set-up, recommendations made for any additional strategies and techniques to increase safety and independence when transferring into walk-in shower, bathing, and toileting. Briefly provided alternative way to get up from sitting on the floor to into wheelchair with verbal instructions and demonstration provided.   Upgraded HEP provided by primary OT. Provided green theraband to progress resistance. Additional exercises added to HEP. Completed while seated. BUE, shoulder, horizontal abduction/adduction. IR/er, PNF pattern, 10X each.  Therapy Documentation Precautions:  Precautions Precautions: Fall (R BKA) Precaution Comments: R BKA Required Braces or Orthoses: Other Brace Other Brace: limb protector Restrictions Weight Bearing Restrictions: No RLE Weight Bearing: Non weight bearing  Pain:  0/10    Therapy/Group: Individual Therapy  Ailene Ravel, OTR/L,CBIS  03/27/2022, 3:21 PM

## 2022-03-27 NOTE — Progress Notes (Signed)
Occupational Therapy Session Note  Patient Details  Name: [name] MRN: [id number] Date of Birth: [birth year]  Today's Date: 03/27/2022 OT Individual Time: 0800-0830 OT Individual Time Calculation (min): 30 min   Pt seen this am for progression of self care and toileting routine via OT training. Pt in bed awaiting therapy and reported on his glycemic event last evening. Pt reports he is on a different insulin regimen at home and his body is adjusting to how the rehab administers his meds. OT reinforced we will plan ahead and bring drink and snack to outing later today. PA Kathlee Nations with surgeon stopped in and assessed progress and R residual limb status. Reported they may remove the staples before he discharges to home. Pt was able to move to EOB from supine to stand with S only to ambulate with RW via hopping on L LE with CGA 15 ft to and from toilet. Transfer to toilet with S and BM on toilet with LE garment mngt and hygiene in standing with RW with close S. Sink side mouth care, hand hygeine and pullover shirt indep. Amb with RW back to recliner with call bell and needs in place at end of session.    Short Term Goals: Week 1:  STG's=LTG's due to LOS    Therapy/Group: Individual Therapy  [name] 03/27/2022, 9:45 AM

## 2022-03-27 NOTE — Progress Notes (Signed)
Occupational Therapy Session Note  Patient Details  Name: Eduardo [name] MRN: [id number] Date of Birth: Mar 28, 1956  Today's Date: 03/27/2022 OT Individual Time: 1030-1200 OT Individual Time Calculation (min): 90 min    Short Term Goals: Week 1:  STG's=LTGs due to ELOS  Focus of session for OT this am on community integration skills, activity tolerance, functional mobility, and ADL safety training via an outing to access [employer] via w/c accessible Academic librarian. Pt was able to problem solve input into planning for outing with urinal and snacks in w/c bag and had already met his bathroom needs. Pt was transported via w/c for time management out to w/c Lucianne Lei by OT. Pt was educated on lift mechanism and was able to take instructions to self propel on to lift and into [place] guided by therapists. OT and TR discussed pt's goals for outing re: the above areas and pt in agreement sharing perspectives and impressions prior to event. OT and TR assisted pt off lift and then self propelled manual w/c through cross walk to access business and inside narrow doors and spaces of [employer] with close S. Pt was able to verbalize coffee order and then self propel up ramp to add condiments with TR holding coffee. OT educated on cup holder options and fanny pack use under front of /c on cross bars for item transport ideas. Pt required min A to navigate through doorways out to patio area. Pt independently utilized rests as needed. Pt amb with RW via hopping on L LE ~ 35 ft on concrete surfaces. OT explored with pt via w/c public restroom access as this is a business pt frequented prior to admission. Training with use of grab bars and w/c set up for toileting and sink access. Pt overall navigated 2 inclines, in/out doors x 3, inclined crosswalk x 2 and parking lot and sidewalks max 175 ft with 35 ft amb with RW with CG A. Pt was assisted back into w/c van via lift, transported back to hospital  and OT assisted back to unit for time management. Pt able to verbalize precautions, safety indep as well as perceptions and impressions of the experience being in w/c and out in community since L BKA. Pt amb 20 ft with RW back to recliner in room and his friend was waiting on him. Left pt with RN who took BGL and call bell and needs within reach. No pain reported throughout session. Care coordination for outing goals met with TR clinician.   Therapy/Group: Individual Therapy  [name] 03/27/2022, 12:32 PM

## 2022-03-27 NOTE — Progress Notes (Signed)
Recreational Therapy Session Note  Patient Details  Name: Eduardo [name] MRN: [id number] Date of Birth: [birth year] Today's Date: 03/27/2022  Pain: no c/o Skilled Therapeutic Interventions/Progress Updates: Pt participated in Community reintegration/outing to [employer] at overall supervision w/c level, contact guard with short distance ambulation.  Goals focused on safe community mobility, identification & negotiation of obstacles, accessing public restroom, energy conservation techniques/education.  See outing goal sheet in shadow chart for full details.   Therapy/Group: [name]  Eduardo [name] 03/27/2022, 12:51 PM

## 2022-03-27 NOTE — Progress Notes (Signed)
Physical Therapy Session Note  Patient Details  Name: Eduardo [name] MRN: [id number] Date of Birth: 1956-09-03  Today's Date: 03/27/2022 PT Individual Time: 1700-1730 PT Individual Time Calculation (min): 30 min   Short Term Goals: Week 1:  PT Short Term Goal 1 (Week 1): =LTG due to ELOS.   Skilled Therapeutic Interventions/Progress Updates:   Pt received sitting in recliner asleep, unable to be aroused. PT returned in 30 minutes, pt able to be aroused,  and agreeable to PT. Pt performed stand pivot transfer with RW  and supervision assist from PT for safety.   WC mobility with supervision assist 2 x 143f to and from day room with pt performing all management of WC parts with cues for set up and safety through doorway.   Stand pivot transfer to and from nustep with supervision assist and RW. Pt performed nustep BUE and LLE endurance training x 8.5 min with cues for decreased speed to improve activity tolerance and reduce early fatigue. .Marland Kitchen  .Patient returned to room and performed stand pivot to recliner with RW and supervision assist. Pt left sitting in recliner with call bell in reach and all needs met.         Therapy Documentation Precautions:  Precautions Precautions: Fall (R BKA) Precaution Comments: R BKA Required Braces or Orthoses: Other Brace Other Brace: limb protector Restrictions Weight Bearing Restrictions: No RLE Weight Bearing: Non weight bearing General: PT Amount of Missed Time (min): 30 Minutes PT Missed Treatment Reason: Patient fatigue Vital Signs: Therapy Vitals Temp: 98.1 F (36.7 C) Temp Source: Oral Pulse Rate: 64 Resp: 18 BP: 98/63 Patient Position (if appropriate): Sitting Oxygen Therapy SpO2: 99 % O2 Device: Room Air Pain: Pain Assessment Pain Scale: 0-10 Pain Score: 0-No pain    Therapy/Group: Individual Therapy  ALorie Phenix5/19/2023, 6:03 PM

## 2022-03-28 LAB — GLUCOSE, CAPILLARY
Glucose-Capillary: 79 mg/dL (ref 70–99)
Glucose-Capillary: 151 mg/dL — ABNORMAL HIGH (ref 70–99)
Glucose-Capillary: 202 mg/dL — ABNORMAL HIGH (ref 70–99)
Glucose-Capillary: 59 mg/dL — ABNORMAL LOW (ref 70–99)
Glucose-Capillary: 122 mg/dL — ABNORMAL HIGH (ref 70–99)
Glucose-Capillary: 65 mg/dL — ABNORMAL LOW (ref 70–99)

## 2022-03-28 MED ORDER — INSULIN NPH (HUMAN) (ISOPHANE) 100 UNIT/ML ~~LOC~~ SUSP
30.0000 [IU] | Freq: Every day | SUBCUTANEOUS | Status: DC
  Administered 2022-03-29 – 2022-03-31 (×3): 30 [IU] via SUBCUTANEOUS
  Filled 2022-03-28: qty 10

## 2022-03-28 MED ORDER — INSULIN ASPART 100 UNIT/ML IJ SOLN
2.0000 [IU] | Freq: Three times a day (TID) | INTRAMUSCULAR | Status: DC
  Administered 2022-03-28: 2 [IU] via SUBCUTANEOUS

## 2022-03-28 MED ORDER — INSULIN NPH (HUMAN) (ISOPHANE) 100 UNIT/ML ~~LOC~~ SUSP
15.0000 [IU] | Freq: Every evening | SUBCUTANEOUS | Status: DC
  Administered 2022-03-28 – 2022-03-30 (×3): 15 [IU] via SUBCUTANEOUS
  Filled 2022-03-28: qty 10

## 2022-03-28 MED ORDER — INSULIN NPH (HUMAN) (ISOPHANE) 100 UNIT/ML ~~LOC~~ SUSP
15.0000 [IU] | Freq: Every day | SUBCUTANEOUS | Status: DC
  Filled 2022-03-28: qty 10

## 2022-03-28 MED ORDER — HYDROMORPHONE HCL 2 MG PO TABS
1.0000 mg | ORAL_TABLET | Freq: Once | ORAL | Status: AC
  Administered 2022-03-28: 1 mg via ORAL
  Filled 2022-03-28: qty 1

## 2022-03-28 NOTE — Progress Notes (Signed)
Hypoglycemic Event  CBG: 59  Treatment: 4 oz juice/soda  Symptoms: sweating  Follow-up CBG: Time:2235 CBG Result:65  Possible Reasons for Event: Unknown  Comments/MD notified:Lisa Rudd NP    Eduardo [name], Eduardo [name]

## 2022-03-28 NOTE — Progress Notes (Signed)
Hypoglycemic Event  CBG: 65  Treatment: 4 oz juice/soda  Symptoms: None  Follow-up CBG: Time:2308 CBG Result:122  Possible Reasons for Event: Unknown  Comments/MD notified:Lisa Rudd NP    Eduardo [name], Eduardo [name]

## 2022-03-29 LAB — GLUCOSE, CAPILLARY
Glucose-Capillary: 120 mg/dL — ABNORMAL HIGH (ref 70–99)
Glucose-Capillary: 158 mg/dL — ABNORMAL HIGH (ref 70–99)
Glucose-Capillary: 145 mg/dL — ABNORMAL HIGH (ref 70–99)
Glucose-Capillary: 74 mg/dL (ref 70–99)

## 2022-03-29 MED ORDER — TORSEMIDE 20 MG PO TABS
10.0000 mg | ORAL_TABLET | Freq: Every day | ORAL | Status: DC
Start: 2022-03-30 — End: 2022-03-31
  Administered 2022-03-30 – 2022-03-31 (×2): 10 mg via ORAL
  Filled 2022-03-29 (×2): qty 1

## 2022-03-29 NOTE — Progress Notes (Signed)
Occupational Therapy Session Note  Patient Details  Name: Eduardo [name] MRN: [id number] Date of Birth: 02-Aug-1956  Today's Date: 03/29/2022 OT Individual Time: 1415-1500 OT Individual Time Calculation (min): 45 min    Short Term Goals: Week 1:   See OT POC   Skilled Therapeutic Interventions/Progress Updates:    Pt received sitting up with no c/o pain, agreeable to OT session. He requested to take a shower. Shrinker donned for skin inspection- incision clean and without drainage. Limb guard donned. He completed functional mobility with the RW to the bathroom with CGA. He was able to doff all clothing with (S) from the shower chair. He completed bathing seated with (S). Education provided re desensitization and limb loss positioning in shower- pt encouraged to gently wash bottom of limb for reduction of phantom sensation. He bathed peri areas in standing with CGA. He transferred out of the shower and back to his w/c with CGA using the RW. He dressed with (S) overall, sit <> stand. Pt was left sitting up in the recliner with all needs met, chair alarm set, and call bell within reach.     Therapy Documentation Precautions:  Precautions Precautions: Fall (R BKA) Precaution Comments: R BKA Required Braces or Orthoses: Other Brace Other Brace: limb protector Restrictions Weight Bearing Restrictions: No RLE Weight Bearing: Non weight bearing  Therapy/Group: Individual Therapy  Curtis Sites 03/29/2022, 7:22 AM

## 2022-03-29 NOTE — Progress Notes (Signed)
PROGRESS NOTE   Subjective/Complaints: Humalin changed back to home dose He asks about showering instructions for his residual limb Provided list of foods for diabetes.  ROS  Denies CP, SOB, N/V/D/C. No HA.  No fevers chills  Objective:   No results found. No results for input(s): WBC, HGB, HCT, PLT in the last 72 hours.  No results for input(s): NA, K, CL, CO2, GLUCOSE, BUN, CREATININE, CALCIUM in the last 72 hours.   Intake/Output Summary (Last 24 hours) at 03/29/2022 2025 Last data filed at 03/29/2022 1255 Gross per 24 hour  Intake 760 ml  Output 800 ml  Net -40 ml        Physical Exam: Vital Signs Blood pressure (!) 92/46, pulse 71, temperature 98.5 F (36.9 C), resp. rate 16, height '5\' 10"'$  (1.778 m), weight 114.2 kg, SpO2 96 %.   Gen: no distress, normal appearing HEENT: oral mucosa pink and moist, NCAT Cardio: Reg rate Chest: normal effort, normal rate of breathing Abd: soft, non-distended Ext: no edema Psych: pleasant, normal affect   Neuro:  Alert and oriented , follows commands, insight normal, memory intact, speech normal Sensation intact to LT b/l UE, decreased sensation in RLE. CN2-12 grossly intact Musculoskeletal: R BKA with stump shrinker Strength 5/5 in b/L UE and RLE Dupuytren contractures b/l hands  Assessment/Plan: 1. Functional deficits which require 3+ hours per day of interdisciplinary therapy in a comprehensive inpatient rehab setting. Physiatrist is providing close team supervision and 24 hour management of active medical problems listed below. Physiatrist and rehab team continue to assess barriers to discharge/monitor patient progress toward functional and medical goals  Care Tool:  Bathing    Body parts bathed by patient: Right arm, Left arm, Chest, Abdomen, Front perineal area, Right upper leg, Left upper leg, Left lower leg, Face, Buttocks     Body parts n/a: Right lower  leg   Bathing assist Assist Level: Minimal Assistance - Patient > 75%     Upper Body Dressing/Undressing Upper body dressing   What is the patient wearing?: Pull over shirt    Upper body assist Assist Level: Independent    Lower Body Dressing/Undressing Lower body dressing      What is the patient wearing?: Pants, Underwear/pull up     Lower body assist Assist for lower body dressing: Supervision/Verbal cueing     Toileting Toileting    Toileting assist Assist for toileting: Supervision/Verbal cueing     Transfers Chair/bed transfer  Transfers assist     Chair/bed transfer assist level: Supervision/Verbal cueing     Locomotion Ambulation   Ambulation assist      Assist level: Contact Guard/Touching assist Assistive device: Walker-rolling Max distance: 20'   Walk 10 feet activity   Assist     Assist level: Supervision/Verbal cueing Assistive device: Walker-rolling, Orthosis   Walk 50 feet activity   Assist Walk 50 feet with 2 turns activity did not occur: Safety/medical concerns         Walk 150 feet activity   Assist Walk 150 feet activity did not occur: Safety/medical concerns         Walk 10 feet on uneven surface  activity  Assist Walk 10 feet on uneven surfaces activity did not occur: Safety/medical concerns         Wheelchair     Assist Is the patient using a wheelchair?: Yes Type of Wheelchair: Manual    Wheelchair assist level: Independent Max wheelchair distance: 175    Wheelchair 50 feet with 2 turns activity    Assist        Assist Level: Independent   Wheelchair 150 feet activity     Assist      Assist Level: Independent   Blood pressure (!) 92/46, pulse 71, temperature 98.5 F (36.9 C), resp. rate 16, height '5\' 10"'$  (1.778 m), weight 114.2 kg, SpO2 96 %.    Medical Problem List and Plan: 1. 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  Antithrombotics: -DVT/anticoagulation:  Pharmaceutical: Lovenox 60 mg daily             -antiplatelet therapy: Plavix and aspirin 3. Pain: continue Tylenol, oxycodone, tramadol as needed            -5/18 pain well controlled 4. Mood: LCSW to evaluate and provide emotional support             -antipsychotic agents: n/a 5. Neuropsych: This patient is capable of making decisions on his own behalf. 6. Skin/Wound Care: Routine skin care checks 7. Fluids/Electrolytes/Nutrition: Routine Is and Os and follow-up chemistries Need to monitor Na+ on sacubatril  8: Diabetes mellitus: CBGs 4 times daily Change back to Humalin 30 AM and 15 HS             -- d/c Novolog with meals. -- Continue sliding scale log with meals and bedtime --at home on weekly Mounjara; stopped Jardiance in February  CBG (last 3)  Recent Labs    03/29/22 0618 03/29/22 1155 03/29/22 1715  GLUCAP 120* 158* 145*    9: Heart failure: Daily weight             -- Continue torsemide 20 mg twice daily             -- Continue Entresto 24/26 mg twice daily 10: Hyperlipidemia: Continue Crestor, Lopid, Zetia, discussed healthy diet 11: Peripheral arterial disease: Continue Plavix, aspirin, statin 12: AKI atop chronic kidney disease: Serum creatinine continues downward trend and appears at baseline.   -Cr down to 1.36 today, improved, continue to follow with weekly labs 13: Coronary artery disease: Stable; continue heart meds, antiplatelet meds, antihyperlipidemia medications.  Jardiance, Imdur and Coreg currently held -No chest pain, follow 14: Constipation: Continue senokot, miralax  -Improved, BM 5/19,  continue regimen 15: Morbid obesity: dietary counseling, carb modified diet 16: Essential hypertension: monitor TID/as needed.  Coreg and Jardiance currently held  -Bps on the lower side for several days, overall stable and continue to monitor Vitals:   03/29/22 1332 03/29/22 1946  BP: 97/73 (!) 92/46  Pulse: 73 71  Resp:  18 16  Temp: 97.9 F (36.6 C) 98.5 F (36.9 C)  SpO2: 98% 96%   On entresto and demadex monitor for dizziness   17. Acute blood loss anemia            -Follow H/H   18. Insomnia   -trazadone and melatonin  -5/16 increase melatonin to '10mg'$   -5/17 sleep improved, continue current medications  -5/18 advised to try trazodone PRN for sleep  -5/19 Sleep improved with trazodone, continue 19. Hypotension: decrease torsemide to '10mg'$  temporarily, monitor weights  LOS: 8 days A FACE TO FACE EVALUATION WAS PERFORMED  Eduardo [name] 03/29/2022, 8:25 PM

## 2022-03-30 LAB — CBC
HCT: 35.3 % — ABNORMAL LOW (ref 39.0–52.0)
Hemoglobin: 11.8 g/dL — ABNORMAL LOW (ref 13.0–17.0)
MCH: 30.5 pg (ref 26.0–34.0)
MCHC: 33.4 g/dL (ref 30.0–36.0)
MCV: 91.2 fL (ref 80.0–100.0)
Platelets: 207 10*3/uL (ref 150–400)
RBC: 3.87 MIL/uL — ABNORMAL LOW (ref 4.22–5.81)
RDW: 14.8 % (ref 11.5–15.5)
WBC: 5.5 10*3/uL (ref 4.0–10.5)
nRBC: 0 % (ref 0.0–0.2)

## 2022-03-30 LAB — BASIC METABOLIC PANEL
Anion gap: 7 (ref 5–15)
BUN: 25 mg/dL — ABNORMAL HIGH (ref 8–23)
CO2: 23 mmol/L (ref 22–32)
Calcium: 8.3 mg/dL — ABNORMAL LOW (ref 8.9–10.3)
Chloride: 108 mmol/L (ref 98–111)
Creatinine, Ser: 1.34 mg/dL — ABNORMAL HIGH (ref 0.61–1.24)
GFR, Estimated: 58 mL/min — ABNORMAL LOW (ref >60)
Glucose, Bld: 79 mg/dL (ref 70–99)
Potassium: 3.9 mmol/L (ref 3.5–5.1)
Sodium: 138 mmol/L (ref 135–145)

## 2022-03-30 LAB — GLUCOSE, CAPILLARY
Glucose-Capillary: 93 mg/dL (ref 70–99)
Glucose-Capillary: 178 mg/dL — ABNORMAL HIGH (ref 70–99)
Glucose-Capillary: 75 mg/dL (ref 70–99)
Glucose-Capillary: 91 mg/dL (ref 70–99)

## 2022-03-30 NOTE — Progress Notes (Signed)
Physical Therapy Discharge Summary  Patient Details  Name: Eduardo [name] MRN: [id number] Date of Birth: 07-10-1956  Today's Date: 03/30/2022 PT Individual Time: 0915-1009 PT Individual Time Calculation (min): 54 min    Patient has met 11 of 11 long term goals due to improved activity tolerance, improved balance, improved postural control, increased strength, decreased pain, ability to compensate for deficits, and improved coordination.  Patient to discharge at a wheelchair level Modified Independent.   Patient's care partner is independent to provide the necessary physical assistance at discharge. Pt able to perform transfers mod I and short distance gait with supervision. Family education performed with emphasis on home safety and navigating home and community environments. Pt is competent and able to direct his care.   Reasons goals not met: d/c stair goal d/t having ramp installed.  Recommendation:  Patient will benefit from ongoing skilled PT services in home health setting to continue to advance safe functional mobility, address ongoing impairments in balance, strength, and minimize fall risk.  Equipment: W/c with amputee pad  Reasons for discharge: treatment goals met and discharge from hospital  Patient/family agrees with progress made and goals achieved: Yes  Skilled Therapeutic Interventions/Progress Updates:  Pt received in recliner and agreeable to therapy.  Pt reports 1/10 pain, premedicated. Rest and positioning provided as needed. Session focused on discussing d/c plans and providing family education to pt's girlfriend, Erline Levine. Pt expressed feeling confident in his ability to problem solve any issues at home, as we have simulated as much as possible. Pt ambulated 2 x 10 ft in room to/from w/c with supervision and RW. Pt also ambulated x 25 ft in the same manner. Provided education on wearing limb guard, appropriate assist levels, problem solving for w/c transportation, w/c set  up/break down. Pt and Erline Levine were receptive to education and expressed understanding. Pt propelled w/c with BUE throughout session, >150 ft mod I. Pt returned to recliner after session and remained with all needs in reach.   PT Discharge Precautions/Restrictions Precautions Precautions: Fall Precaution Comments: R BKA Required Braces or Orthoses: Other Brace Other Brace: limb protector Restrictions Weight Bearing Restrictions: No RLE Weight Bearing: Non weight bearing  Pain Pain Assessment Pain Score: 1  Pain Interference Pain Interference Pain Effect on Sleep: 1. Rarely or not at all Pain Interference with Therapy Activities: 1. Rarely or not at all Pain Interference with Day-to-Day Activities: 1. Rarely or not at all Vision/Perception  Vision - History Ability to See in Adequate Light: 0 Adequate Perception Perception: Within Functional Limits Praxis Praxis: Intact  Cognition Overall Cognitive Status: Within Functional Limits for tasks assessed Arousal/Alertness: Awake/alert Orientation Level: Oriented X4 Year: 2023 Month: May Day of Week: Correct Focused Attention: Appears intact Memory: Appears intact Awareness: Appears intact Problem Solving: Appears intact Executive Function: Reasoning;Self Correcting;Decision Making Reasoning: Appears intact Decision Making: Appears intact Self Correcting: Appears intact Safety/Judgment: Appears intact Sensation Sensation Light Touch: Appears Intact Coordination Gross Motor Movements are Fluid and Coordinated: Yes Fine Motor Movements are Fluid and Coordinated: Yes Motor  Motor Motor: Within Functional Limits Motor - Discharge Observations: All WFL  Mobility Bed Mobility Bed Mobility: Supine to Sit;Sit to Supine Supine to Sit: Independent Sit to Supine: Independent Transfers Transfers: Sit to Stand;Stand to [employer];Lateral/Scoot Transfers Sit to Stand: Independent with assistive device Stand to  Sit: Independent with assistive device Stand Pivot Transfers: Independent with assistive device;Supervision/Verbal cueing Stand Pivot Transfer Details: Verbal cues for safe use of DME/AE;Verbal cues for precautions/safety;Verbal cues for sequencing  Lateral/Scoot Transfers: Supervision/Verbal cueing Transfer (Administrator, sports): Manufacturing systems engineer Ambulation: Yes Gait Assistance: Supervision/Verbal cueing Gait Distance (Feet): 25 Feet Assistive device: Rolling walker Gait Assistance Details: Verbal cues for technique High Level Ambulation High Level Ambulation: Side stepping;Backwards walking Side Stepping: SUP for potential need in home MBA Backwards Walking: SUP/ModI for technique Stairs / Additional Locomotion Stairs: No Wheelchair Mobility Wheelchair Mobility: Yes Wheelchair Assistance: Independent with assistive device Wheelchair Propulsion: Both upper extremities Wheelchair Parts Management: Supervision/cueing;Independent Distance: >200 ft  Trunk/Postural Assessment  Cervical Assessment Cervical Assessment: Within Functional Limits Thoracic Assessment Thoracic Assessment: Exceptions to [facility] Lumbar Assessment Lumbar Assessment: Within Functional Limits Postural Control Postural Control: Within Functional Limits  Balance Balance Balance Assessed: Yes Static Sitting Balance Static Sitting - Balance Support: Feet supported Static Sitting - Level of Assistance: 7: Independent Dynamic Sitting Balance Dynamic Sitting - Balance Support: During functional activity;Feet supported Dynamic Sitting - Level of Assistance: 7: Independent Sitting balance - Comments: Pt sits w/o UE support. Static Standing Balance Static Standing - Balance Support: Bilateral upper extremity supported;During functional activity Static Standing - Level of Assistance: 5: Stand by assistance;6: Modified independent (Device/Increase time) Dynamic Standing Balance Dynamic Standing - Balance  Support: Bilateral upper extremity supported Dynamic Standing - Level of Assistance: 5: Stand by assistance Extremity Assessment      RLE Assessment RLE Assessment: Within Functional Limits General Strength Comments: hip and knee WFL LLE Assessment LLE Assessment: Within Functional Limits General Strength Comments: grossly 5/5 knee, hip 4/5.    Mickel Fuchs 03/30/2022, 10:12 AM

## 2022-03-30 NOTE — Progress Notes (Signed)
Occupational Therapy Discharge Summary  Patient Details  Name: Eduardo [name] MRN: [id number] Date of Birth: 10/17/1956  Today's Date: 03/30/2022 OT Individual Time: 1400-1500 OT Individual Time Calculation (min): 60 min    Patient has met 7 of 7 long term goals due to improved activity tolerance, improved balance, and ability to compensate for deficits.  Patient to discharge at overall Modified Independent level.  Patient's care partner is independent to provide the necessary physical assistance at discharge.    Reasons goals not met: n/a  Recommendation:  Patient will benefit from ongoing skilled OT services in home health setting to continue to advance functional skills in the area of BADL, iADL, and Reduce care partner burden.  Equipment: Pt has been delivered and trained in manual w/c use, cushion, and issued skin inspection mirror. Pt haivng RW, grab bars and portable ramp delivered to home.   Reasons for discharge: treatment goals met  Patient/family agrees with progress made and goals achieved: Yes  OT Discharge Precautions/Restrictions  Precautions Precautions: Fall Precaution Comments: R BKA Required Braces or Orthoses: Other Brace Other Brace: limb protector Restrictions Weight Bearing Restrictions: No RLE Weight Bearing: Non weight bearing General   Vital Signs Therapy Vitals Temp: 97.8 F (36.6 C) Pulse Rate: 69 Resp: 16 BP: (!) 95/56 Patient Position (if appropriate): Sitting Oxygen Therapy SpO2: 98 % O2 Device: Room Air Pain Pain Assessment Pain Scale: 0-10 Pain Score: 0-No pain ADL ADL Equipment Provided: Other (comment) (pt has all AE and OT issued skin inspection mirror) Eating: Independent Where Assessed-Eating: Chair Grooming: Independent Where Assessed-Grooming: Chair, Sitting at sink, Wheelchair Upper Body Bathing: Independent Where Assessed-Upper Body Bathing: Chair, Sitting at sink, Retail buyer Bathing: Modified  independent Where Assessed-Lower Body Bathing: Sitting at sink, Standing at sink, Administrator, sports Dressing: Independent Where Assessed-Upper Body Dressing: Chair Lower Body Dressing: Modified independent Where Assessed-Lower Body Dressing: Standing at sink, Sitting at sink, Chair Toileting: Minimal assistance, Modified independent Where Assessed-Toileting: Risk analyst Method: Counselling psychologist: Raised toilet seat, Energy manager: Minimal assistance (based on observation) Tub/Shower Equipment: Civil engineer, contracting with back Social research officer, government: Modified independent Social research officer, government Method: Radiographer, therapeutic: Grab bars, Shower seat with back ADL Comments: Completing all UB self care and LB self care mod I level Vision Baseline Vision/History: 1 Wears glasses Patient Visual Report: No change from baseline Vision Assessment?: No apparent visual deficits Perception  Perception: Within Functional Limits Praxis Praxis: Intact Cognition Cognition Overall Cognitive Status: Within Functional Limits for tasks assessed Arousal/Alertness: Awake/alert Orientation Level: Person;Place;Situation Place: Oriented Situation: Oriented Memory: Appears intact Attention: Focused Focused Attention: Appears intact Awareness: Appears intact Problem Solving: Appears intact Executive Function: Reasoning;Self Correcting;Decision Making Reasoning: Appears intact Decision Making: Appears intact Self Correcting: Appears intact Safety/Judgment: Appears intact Brief Interview for Mental Status (BIMS) Repetition of Three Words (First Attempt): 3 Temporal Orientation: Year: Correct Temporal Orientation: Month: Accurate within 5 days Temporal Orientation: Day: Correct Recall: "Sock": Yes, no cue required Recall: "Blue": Yes, no cue required Recall: "Bed": Yes, no cue required BIMS Summary Score:  15 Sensation Sensation Light Touch: Appears Intact Coordination Gross Motor Movements are Fluid and Coordinated: Yes Fine Motor Movements are Fluid and Coordinated: Yes Motor  Motor Motor: Within Functional Limits Motor - Discharge Observations: All WFL Mobility  Bed Mobility Bed Mobility: Supine to Sit;Sit to Supine Supine to Sit: Independent Sit to Supine: Independent Transfers Sit to Stand: Independent with assistive device Stand to  Sit: Independent with assistive device  Trunk/Postural Assessment  Cervical Assessment Cervical Assessment: Within Functional Limits Thoracic Assessment Thoracic Assessment: Within Functional Limits Lumbar Assessment Lumbar Assessment: Within Functional Limits Postural Control Postural Control: Within Functional Limits  Balance Balance Balance Assessed: Yes Static Sitting Balance Static Sitting - Balance Support: Feet supported Static Sitting - Level of Assistance: 7: Independent Dynamic Sitting Balance Dynamic Sitting - Balance Support: During functional activity;Feet supported Dynamic Sitting - Level of Assistance: 7: Independent Sitting balance - Comments: Pt sits w/o UE support. Static Standing Balance Static Standing - Balance Support: Bilateral upper extremity supported;During functional activity Static Standing - Level of Assistance: 5: Stand by assistance;6: Modified independent (Device/Increase time) Dynamic Standing Balance Dynamic Standing - Balance Support: Bilateral upper extremity supported Dynamic Standing - Level of Assistance: 5: Stand by assistance Extremity/Trunk Assessment RUE Assessment RUE Assessment: Within Functional Limits LUE Assessment LUE Assessment: Within Functional Limits  Final OT discharge training session conducted this pm in preparation for pt to be discharged from CIR tomorrow. Pt received in recliner and requested to use toilet this session. Pt was able to amb with RW to bathroom and then to sink ~  20 ft with S and complete all peri hygiene and garment management with mod I. Pt was able to self propel to and from therapy demonstration kitchen independently. OT completed training with standing at kitchen counters for reach into cabinets for safe item retrieval. Demonstration for safe reaching into fridge and pantry with positive teach back. Mod I for simple item retrieval. Pt ambulated to closet and dressers with RW with S only. OT issued and trained in skin inspection mirror and educated on importance of maintaining skin integrity. Once back on unit in room, pt verbalized he felt educated and prepared for tomorrow's discharge home. Left pt with needs and call bell in reach. No further OT needs at this time with OT d/c completed.    [name] 03/30/2022, 4:25 PM

## 2022-03-30 NOTE — Progress Notes (Signed)
Inpatient Rehabilitation Discharge Medication Review by a Pharmacist  A complete drug regimen review was completed for this patient to identify any potential clinically significant medication issues.  High Risk Drug Classes Is patient taking? Indication by Medication  Antipsychotic No   Anticoagulant No   Antibiotic No   Opioid Yes OxyIR- acute pain  Antiplatelet Yes Aspirin, plavix- PVD  Hypoglycemics/insulin Yes Mounjaro, humulin- T2DM  Vasoactive Medication Yes Demadex, Entresto- HFrEF  Chemotherapy No   Other Yes Zetia, crestor, Lopid- HLD /HTG Protonix- GERD     Type of Medication Issue Identified Description of Issue Recommendation(s)  Drug Interaction(s) (clinically significant)     Duplicate Therapy     Allergy     No Medication Administration End Date     Incorrect Dose     Additional Drug Therapy Needed     Significant med changes from prior encounter (inform family/care partners about these prior to discharge).    Other       Clinically significant medication issues were identified that warrant physician communication and completion of prescribed/recommended actions by midnight of the next day:  No   Time spent performing this drug regimen review (minutes):  30   Bria Sparr BS, PharmD, BCPS Clinical Pharmacist 03/30/2022 10:58 AM  Contact: [phone] after 3 PM  "Be curious, not judgmental..." -Jamal Maes

## 2022-03-30 NOTE — Progress Notes (Signed)
Occupational Therapy Session Note  Patient Details  Name: KOLTEN RYBACK MRN: [id number] Date of Birth: 02/23/1956  Today's Date: 03/30/2022 OT Individual Time: 1030-1130 OT Individual Time Calculation (min): 60 min    Short Term Goals: Week 1:  STG=LTG's due to LOS   Skilled Therapeutic Interventions/Progress Updates:   Pt seated in recliner upon OT arrival for skilled family/caregiver and patient education training session. Pt's girlfriend Erline Levine present and open to all activity present this session in preparation for discharge home tomorrow. Pt's new manual w/c, back and cushion arrived and OT trained in how to secure in place, purpose of brake extensions and anti-tip features. Pt was able to transfer to RW with mod I sit to stand to RW and amb to w/c. Educated on use of gait belt for safety. OT assisted pt in w/c to demonstration tub room for training with stall shower frame to mirror skills needed to access threshold of stall shower in home as well as car transfer training. Pt was able to back up to stall shower threshold and navigate 4" frame with cl S. Sat on shower seat with mod I. Transferred from shower seat to w/c with mod I. Car set up to mirror pt's model and OT training with steps of transfer, w/c and RW management and pt able to perform in and out with S. OT transported pt back to room via w/c and girlfriend reported handyman was already at home to install grab bars. No further caregiver training needs and OT to complete discharge trng with pt later day. Left pt with girlfreind with call bell and needs in place.   Therapy Documentation Precautions:  Precautions Precautions: Fall Precaution Comments: R BKA Required Braces or Orthoses: Other Brace Other Brace: limb protector Restrictions Weight Bearing Restrictions: No RLE Weight Bearing: Non weight bearing   Therapy/Group: Individual Therapy  [name] 03/30/2022, 3:57 PM

## 2022-03-30 NOTE — Progress Notes (Signed)
Physical Therapy Session Note  Patient Details  Name: Eduardo [name] MRN: [id number] Date of Birth: 1955/11/24  Today's Date: 03/28/2022 PT Individual Time:  0909-1010 PT Individual Time Calculation (min): 61 min  Short Term Goals: Week 1:  PT Short Term Goal 1 (Week 1): =LTG due to ELOS.   Skilled Therapeutic Interventions/Progress Updates:  Patient seated upright in recliner on entrance to room. Patient alert and agreeable to PT session. Disc with girlfriend on phone re: pt's schedule over weekend and on Monday in order for girlfriend to prepare for time of family education prior to d/c home on Tuesday.  Patient with no pain complaint throughout session.  Therapeutic Activity: Transfers: Patient performed sit<>stand and stand pivot transfers throughout session with Mod I. No vc required for technique. Lateral scoot transfer performed with close supervision and cues for increased clearance with efforts.  Car transfer performed to moderate SUV height at 31" and able to complete with close supervision and vc for hand placement.  Gait Training:  Patient ambulated 18' x2/ 25' x1 using RW with hop-to gait pattern with close supervision/ Mod I. Provided vc/ tc for maintaining toe touch technique for improved shock absorbance to body.  Wheelchair Mobility:  Patient propelled wheelchair >200 feet x2 with Mod I/ supervision. Provided vc for technique to clear tight doorway to ortho gym. Is able to propel up/ down ramp in ortho gym and clears transitions with no physical assist. VC provided re: need for lean in appropriate direction in order to [facility] front/ back of w/c for low obstacle clearance as well as for comfort in balance during mobility up/ down ramped surfaces. Demonstrated on separate w/c potential need for S.O. to assist with pop-up of front wheels with step down on rear rails of w/c as well as lift og rear of wc in order to clear threshold step of home after ascending ramp.   Patient  seated  in recliner at end of session with brakes locked, seat pad alarm set, and all needs within reach. Related to pt that discharge mobility assessment will be performed by intended primary therapist for team on Monday. Will relate pt's level of mobility to therapist.    Therapy Documentation Precautions:  Precautions Precautions: Fall (R BKA) Precaution Comments: R BKA Required Braces or Orthoses: Other Brace Other Brace: limb protector Restrictions Weight Bearing Restrictions: No RLE Weight Bearing: Non weight bearing General:   Vital Signs:  Pain:  No pain complaint this session.  Therapy/Group: Individual Therapy  Alger Simons PT, DPT 03/30/2022, 2:15 AM

## 2022-03-30 NOTE — Plan of Care (Signed)
  Problem: RH Balance Goal: LTG Patient will maintain dynamic standing with ADLs (OT) Description: LTG:  Patient will maintain dynamic standing balance with assist during activities of daily living (OT)  Outcome: Completed/Met   Problem: Sit to Stand Goal: LTG:  Patient will perform sit to stand in prep for activites of daily living with assistance level (OT) Description: LTG:  Patient will perform sit to stand in prep for activites of daily living with assistance level (OT) Outcome: Completed/Met   Problem: RH Bathing Goal: LTG Patient will bathe all body parts with assist levels (OT) Description: LTG: Patient will bathe all body parts with assist levels (OT) Outcome: Completed/Met   Problem: RH Dressing Goal: LTG Patient will perform upper body dressing (OT) Description: LTG Patient will perform upper body dressing with assist, with/without cues (OT). Outcome: Completed/Met Goal: LTG Patient will perform lower body dressing w/assist (OT) Description: LTG: Patient will perform lower body dressing with assist, with/without cues in positioning using equipment (OT) Outcome: Completed/Met   Problem: RH Toileting Goal: LTG Patient will perform toileting task (3/3 steps) with assistance level (OT) Description: LTG: Patient will perform toileting task (3/3 steps) with assistance level (OT)  Outcome: Completed/Met   Problem: RH Simple Meal Prep Goal: LTG Patient will perform simple meal prep w/assist (OT) Description: LTG: Patient will perform simple meal prep with assistance, with/without cues (OT). Outcome: Completed/Met   Problem: RH Light Housekeeping Goal: LTG Patient will perform light housekeeping w/assist (OT) Description: LTG: Patient will perform light housekeeping with assistance, with/without cues (OT). Outcome: Completed/Met

## 2022-03-30 NOTE — Progress Notes (Signed)
Occupational Therapy Session Note  Patient Details  Name: Eduardo [name] MRN: [id number] Date of Birth: December 02, 1955  Today's Date: 03/30/2022 OT Individual Time: 0732-0800 OT Individual Time Calculation (min): 28 min   Short Term Goals: Week 1:   LTG=STG 2/2 ELOS  Skilled Therapeutic Interventions/Progress Updates:    Pt greeted seated in recliner and agreeable to OT treatment session. Discussed home bathroom set-up and logistics for toilet transfers. Pt reported he will either use the RW or BSC. Pt ambulated to the bathroom with RW and supervision. He stood to manage clothing, then sat to void. Pt with successful BM and voided bladder. Pt completed hygiene, then sat back down to retrieve pants demonstrating good safety awareness. Grooming tasks completed from wc at the sink. Pt then propelled wc to therapy gym and addressed functional ambulation with cone weaving obstacle courses, RW, and supervision. Pt returned to room and left seated in wc with needs met.     Therapy Documentation Precautions:  Precautions Precautions: Fall (R BKA) Precaution Comments: R BKA Required Braces or Orthoses: Other Brace Other Brace: limb protector Restrictions Weight Bearing Restrictions: No RLE Weight Bearing: Non weight bearing Pain:  Denies pain   Therapy/Group: Individual Therapy  [name] 03/30/2022, 7:44 AM

## 2022-03-31 ENCOUNTER — Other Ambulatory Visit: Payer: Self-pay | Admitting: Physician Assistant

## 2022-03-31 LAB — GLUCOSE, CAPILLARY
Glucose-Capillary: 123 mg/dL — ABNORMAL HIGH (ref 70–99)
Glucose-Capillary: 166 mg/dL — ABNORMAL HIGH (ref 70–99)

## 2022-03-31 MED ORDER — TORSEMIDE 10 MG PO TABS: 10.0000 mg | ORAL_TABLET | Freq: Every day | ORAL | 0 refills | Status: DC

## 2022-03-31 MED ORDER — ACETAMINOPHEN 325 MG PO TABS: 325.0000 mg | ORAL_TABLET | ORAL | Status: DC | PRN

## 2022-03-31 MED ORDER — ASCORBIC ACID 1000 MG PO TABS: 1000.0000 mg | ORAL_TABLET | Freq: Every day | ORAL | Status: DC

## 2022-03-31 MED ORDER — OXYCODONE HCL 5 MG PO TABS: 5.0000 mg | ORAL_TABLET | ORAL | 0 refills | Status: DC | PRN

## 2022-03-31 MED ORDER — MELATONIN 10 MG PO TABS: 10.0000 mg | ORAL_TABLET | Freq: Every day | ORAL | 0 refills | Status: DC

## 2022-03-31 NOTE — Progress Notes (Signed)
PROGRESS NOTE   Subjective/Complaints:  Pt happy with better CBGs- pain 1/10- but wants pain meds to go home with, since if falls, or pain gets worse with therapy- will send home on 7 days.   LBM yesterday.  Went over ways to prevent contractures.   D/c today- went over d/c instructions meds and expectations- long term .    ROS;  Pt denies SOB, abd pain, CP, N/V/C/D, and vision changes  Objective:   No results found. Recent Labs    03/30/22 0035  WBC 5.5  HGB 11.8*  HCT 35.3*  PLT 207    Recent Labs    03/30/22 0035  NA 138  K 3.9  CL 108  CO2 23  GLUCOSE 79  BUN 25*  CREATININE 1.34*  CALCIUM 8.3*     Intake/Output Summary (Last 24 hours) at 03/31/2022 0836 Last data filed at 03/31/2022 0500 Gross per 24 hour  Intake 360 ml  Output 1750 ml  Net -1390 ml        Physical Exam: Vital Signs Blood pressure 101/61, pulse 70, temperature (!) 97.5 F (36.4 C), temperature source Oral, resp. rate 15, height '5\' 10"'$  (1.778 m), weight 114.2 kg, SpO2 96 %.   General: awake, alert, appropriate, sitting up in bedside chair; NAD HENT: conjugate gaze; oropharynx moist CV: regular rate; no JVD Pulmonary: CTA B/L; no W/R/R- good air movement GI: soft, NT, ND, (+)BS Psychiatric: appropriate Neurological: Ox3  Neuro:  Alert and oriented , follows commands, insight normal, memory intact, speech normal Sensation intact to LT b/l UE, decreased sensation in RLE. CN2-12 grossly intact Musculoskeletal: R BKA with stump shrinker Strength 5/5 in b/L UE and RLE Dupuytren contractures b/l hands   Assessment/Plan: 1. Functional deficits which require 3+ hours per day of interdisciplinary therapy in a comprehensive inpatient rehab setting. Physiatrist is providing close team supervision and 24 hour management of active medical problems listed below. Physiatrist and rehab team continue to assess barriers to  discharge/monitor patient progress toward functional and medical goals  Care Tool:  Bathing    Body parts bathed by patient: Right arm, Left arm, Chest, Abdomen, Front perineal area, Right upper leg, Left upper leg, Left lower leg, Face, Buttocks     Body parts n/a: Right lower leg   Bathing assist Assist Level: Independent with assistive device Assistive Device Comment: AE   Upper Body Dressing/Undressing Upper body dressing   What is the patient wearing?: Pull over shirt    Upper body assist Assist Level: Independent    Lower Body Dressing/Undressing Lower body dressing      What is the patient wearing?: Ace wrap/stump shrinker, Underwear/pull up, Pants     Lower body assist Assist for lower body dressing: Independent with assitive device     Toileting Toileting    Toileting assist Assist for toileting: Independent with assistive device     Transfers Chair/bed transfer  Transfers assist     Chair/bed transfer assist level: Independent with assistive device Chair/bed transfer assistive device: Armrests, Programmer, multimedia   Ambulation assist      Assist level: Supervision/Verbal cueing Assistive device: Walker-rolling Max distance: 25'   Walk  10 feet activity   Assist     Assist level: Supervision/Verbal cueing Assistive device: Walker-rolling   Walk 50 feet activity   Assist Walk 50 feet with 2 turns activity did not occur: Safety/medical concerns         Walk 150 feet activity   Assist Walk 150 feet activity did not occur: Safety/medical concerns         Walk 10 feet on uneven surface  activity   Assist Walk 10 feet on uneven surfaces activity did not occur: Safety/medical concerns         Wheelchair     Assist Is the patient using a wheelchair?: Yes Type of Wheelchair: Manual    Wheelchair assist level: Independent Max wheelchair distance: >200 ft    Wheelchair 50 feet with 2 turns  activity    Assist        Assist Level: Independent   Wheelchair 150 feet activity     Assist      Assist Level: Independent   Blood pressure 101/61, pulse 70, temperature (!) 97.5 F (36.4 C), temperature source Oral, resp. rate 15, height '5\' 10"'$  (1.778 m), weight 114.2 kg, SpO2 96 %.    Medical Problem List and Plan: 1. 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    2.  Antithrombotics: -DVT/anticoagulation:  Pharmaceutical: Lovenox 60 mg daily             -antiplatelet therapy: Plavix and aspirin 3. Pain: continue Tylenol, oxycodone, tramadol as needed            -5/18 pain well controlled 4. Mood: LCSW to evaluate and provide emotional support             -antipsychotic agents: n/a 5. Neuropsych: This patient is capable of making decisions on his own behalf. 6. Skin/Wound Care: Routine skin care checks 7. Fluids/Electrolytes/Nutrition: Routine Is and Os and follow-up chemistries Need to monitor Na+ on sacubatril  8: Diabetes mellitus: CBGs 4 times daily Change back to Humalin 30 AM and 15 HS             -- d/c Novolog with meals. -- Continue sliding scale log with meals and bedtime --at home on weekly Mounjara; stopped Jardiance in February  5/23- CBGs looking much better- con't home dosing.   CBG (last 3)  Recent Labs    03/30/22 1607 03/30/22 2107 03/31/22 0618  GLUCAP 75 91 123*    9: Heart failure: Daily weight             -- Continue torsemide 20 mg twice daily             -- Continue  Entresto 24/26 mg twice daily 10: Hyperlipidemia: Continue Crestor, Lopid, Zetia, discussed healthy diet 11: Peripheral arterial disease: Continue Plavix,  aspirin, statin 12: AKI atop chronic kidney disease: Serum creatinine continues downward trend and appears at baseline.   -Cr down to 1.36 today, improved, continue to follow with weekly labs 13: Coronary artery disease: Stable; continue heart meds, antiplatelet meds, antihyperlipidemia medications.  Jardiance, Imdur and Coreg currently held -No chest pain, follow 14: Constipation: Continue senokot, miralax  -Improved, BM 5/19, continue regimen 15: Morbid obesity: dietary counseling, carb modified diet 16: Essential hypertension: monitor TID/as needed.  Coreg and Jardiance currently held  -Bps on the lower side for several days, overall stable and continue to monitor  5/23- bp SOFT, BUT ASYMPTOMATIC- CON'T REGIMEN Vitals:   03/30/22 1950 03/31/22 0439  BP: 96/70 101/61  Pulse: 73 70  Resp: 14 15  Temp: 98.2 F (36.8 C) (!) 97.5 F (36.4 C)  SpO2: 96% 96%   On entresto and demadex monitor for dizziness   17. Acute blood loss anemia            -Follow H/H   18. Insomnia   -trazadone and melatonin  -5/16 increase melatonin to '10mg'$   -5/17 sleep improved, continue current medications  -5/18 advised to try trazodone PRN for sleep  -5/19 Sleep improved with trazodone, continue 19. Hypotension: decrease torsemide to '10mg'$  temporarily, monitor weights  5/23- WEIGHT DOING WELL.   I spent a total of 32    minutes on total care today- >50% coordination of care- due to discussion about contracture prevention- and meds as well as CBGs   LOS: 10 days A FACE TO FACE EVALUATION WAS PERFORMED  Elisabet Gutzmer 03/31/2022, 8:36 AM

## 2022-03-31 NOTE — Progress Notes (Signed)
Inpatient Rehabilitation Care Coordinator Discharge Note   Patient Details  Name: Eduardo [name] MRN: [id number] Date of Birth: 1956-01-20   Discharge location: HOME WITH GIRLFRIEND WHO CAN PROVIDE 24/7 SUPERVISION  Length of Stay: 10 DAYS  Discharge activity level: SUPERVISION-MOD/I  Home/community participation: ACTIVE  Patient response DY:JWLKHV Literacy - How often do you need to have someone help you when you read instructions, pamphlets, or other written material from your doctor or pharmacy?: Never  Patient response FM:BBUYZJ Isolation - How often do you feel lonely or isolated from those around you?: Never  Services provided included: MD, RD, PT, OT, RN, CM, Pharmacy, SW  Financial Services:  Charity fundraiser Utilized: Private Insurance [employer]  Choices offered to/list presented to: PT  Follow-up services arranged:  Home Health, DME Home Health Agency: [facility]    DME : [facility] ALL OTHER EQUIPMENT    Patient response to transportation need: Is the patient able to respond to transportation needs?: Yes In the past 12 months, has lack of transportation kept you from medical appointments or from getting medications?: No In the past 12 months, has lack of transportation kept you from meetings, work, or from getting things needed for daily living?: No    Comments (or additional information):PT DID WELL AND [place] WAS [place]  Patient/Family verbalized understanding of follow-up arrangements:  Yes  Individual responsible for coordination of the follow-up plan: STACEY-GIRLFRIEND [phone]  Confirmed correct DME delivered: Elease Hashimoto 03/31/2022    Voshon Petro, [place]

## 2022-04-01 ENCOUNTER — Telehealth: Payer: Self-pay

## 2022-04-01 NOTE — Telephone Encounter (Signed)
Transition Care Management Follow-up Telephone Call Date of discharge and from where: [name] 03/31/22 How have you been since you were released from the hospital? Eduardo [name] Any questions or concerns? No  Items Reviewed: Did the pt receive and understand the discharge instructions provided? Yes  Medications obtained and verified? Yes  Other? No  Any new allergies since your discharge? No  Dietary orders reviewed? Yes Do you have support at home? Yes   Home Care and Equipment/Supplies: Were home health services ordered? yes If so, what is the name of the agency? [facility]  Has the agency set up a time to come to the patient's home? No pt. Did not want at the current time.    Functional Questionnaire: (I = Independent and D = Dependent) ADLs: I  Bathing/Dressing- D  Meal Prep- I  Eating- I  Maintaining continence- I  Transferring/Ambulation- D  Managing Meds- I  Follow up appointments reviewed:  PCP Hospital f/u appt confirmed? Yes  Scheduled to see Dr. Tomi Bamberger  on 04/08/22 @ 11:30. [facility] f/u appt confirmed? No  pt. Is to f/u with Cardiology and ortho surgeon but has not been set up yet.  Are transportation arrangements needed? No  If their condition worsens, is the pt aware to call PCP or go to the Emergency Dept.? Yes Was the patient provided with contact information for the PCP's office or ED? Yes Was to pt encouraged to call back with questions or concerns? Yes

## 2022-04-02 ENCOUNTER — Ambulatory Visit (INDEPENDENT_AMBULATORY_CARE_PROVIDER_SITE_OTHER): Payer: Medicare Other | Admitting: Orthopedic Surgery

## 2022-04-02 DIAGNOSIS — Z89421 Acquired absence of other right toe(s): Secondary | ICD-10-CM | POA: Diagnosis not present

## 2022-04-02 DIAGNOSIS — I10 Essential (primary) hypertension: Secondary | ICD-10-CM | POA: Diagnosis not present

## 2022-04-02 DIAGNOSIS — E1165 Type 2 diabetes mellitus with hyperglycemia: Secondary | ICD-10-CM | POA: Diagnosis not present

## 2022-04-02 DIAGNOSIS — I2510 Atherosclerotic heart disease of native coronary artery without angina pectoris: Secondary | ICD-10-CM | POA: Diagnosis not present

## 2022-04-02 DIAGNOSIS — N189 Chronic kidney disease, unspecified: Secondary | ICD-10-CM | POA: Diagnosis not present

## 2022-04-02 DIAGNOSIS — I739 Peripheral vascular disease, unspecified: Secondary | ICD-10-CM | POA: Diagnosis not present

## 2022-04-02 DIAGNOSIS — R809 Proteinuria, unspecified: Secondary | ICD-10-CM | POA: Diagnosis not present

## 2022-04-02 DIAGNOSIS — Z89511 Acquired absence of right leg below knee: Secondary | ICD-10-CM

## 2022-04-02 DIAGNOSIS — L97509 Non-pressure chronic ulcer of other part of unspecified foot with unspecified severity: Secondary | ICD-10-CM | POA: Diagnosis not present

## 2022-04-02 DIAGNOSIS — E7800 Pure hypercholesterolemia, unspecified: Secondary | ICD-10-CM | POA: Diagnosis not present

## 2022-04-03 NOTE — Progress Notes (Unsigned)
Primary Physician/Referring:  Rita Ohara, MD  Patient ID: [name], male    DOB: [birth year], 66 y.o.   MRN: [id number]  No chief complaint on file.  HPI:    DIMITRY HOLSWORTH  is a 66 y.o. Caucasian male  with controlled type 2 diabetes, hypertension, hyperlipidemia, coronary artery disease by angiography on 09/08/2016 revealing severe triple-vessel CAD with no targets for CABG, he presented with new onset left bundle branch block and acute fulminant pulmonary edema needing intubation on 10/11/2020, emergent cardiac catheterization essentially revealing progression of severe native vessel disease with no significant option for revascularization.  Although LAD was felt to be amenable for potential revascularization, it was unchanged from prior cardiac catheterization and RCA was felt to be the culprit, in view of diffuse disease medical management was recommended.  Past medical history is also significant for morbid obesity, diabetes mellitus with stage III AV chronic kidney disease, mild carotid atherosclerosis, right SFA occlusion S/P complex PV angiogram on 02/28/2019 with PTA to occluded right SFA with implantation of 3 overlapping 6.0 x 120 x2; 6.0 x 100 mm Eluvia DES for critical right leg ischemia now resolved. 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 He has mild disease in the left leg.  PAD has remained stable. Due to frequent PVCs, he was also started on amiodarone  prophylactically.   Patient was hospitalized 12/28/2020-01/02/2021 for acute on chronic systolic heart failure.  Patient was diuresed well, and given ischemic cardiomyopathy as etiology of heart failure attempted high risk PCI to LAD CTO, however it was unsuccessful. Conservative medical management was recommended, with consideration of palliative care involvement.  Patient was previously on spironolactone, however due to renal insufficiency this has been discontinued.  Patient was last seen in the office  02/17/2022 at which time he was stable from a cardiovascular standpoint and advised to follow-up in 6 months.  However patient subsequently was hospitalized 03/08/2022 - 03/21/2022 with septic shock secondary to strep bacteremia from diabetic foot infection.  During hospitalization patient underwent right BKA by Dr. Sharol Given on 03/12/2022.  Patient now presents for hospital follow-up and management of heart failure. ***  ***  Patient presents for 30-monthfollow-up.  At last office visit patient was relatively stable from a cardiovascular standpoint, therefore no changes were made. Patient continues to feel well overall without specific complaints today. Denies chest pain, significant dyspnea, leg edema, orthopnea.   Past Medical History:  Diagnosis Date   CHF (congestive heart failure) (HCC)    Chronic kidney disease    stage 3   Colon polyp    Coronary artery disease    Diabetes mellitus 1987   under care of Dr. BChalmers Cater  On insulin since 96 (off and on)   Diabetic retinopathy    Dupuytren contracture    R hand, s/p injection (Dr. CLenon Curt   Essential hypertension, benign    Essential hypertension, benign 02/06/2019   Frequency of urination and polyuria    Hypertension    Myocardial infarction ([facility]    denies   [place]romuscular disorder ([place]    Diabetic neuropathy   [place]omyelitis ([place]    right foot   Other testicular hypofunction    Peripheral arterial disease ([facility] 10/28/2012   Peritoneal abscess ([facility] 6/08   and buttock.   Pneumonia    Polydipsia    Proteinuria    Pure hyperglyceridemia    Subacute osteomyelitis, right ankle and foot (HCC)    Wears glasses    Past  Surgical History:  Procedure Laterality Date   ABDOMINAL AORTAGRAM N/A 04/18/2012   Procedure: ABDOMINAL Maxcine Ham;  Surgeon: Angelia Mould, MD;  Location: [facility] CATH LAB;  Service: Cardiovascular;  Laterality: N/A;   AMPUTATION Right 05/19/2019   Procedure: RIGHT FOOT 5TH RAY AMPUTATION;  Surgeon: Newt Minion, MD;   Location: [place];  Service: Orthopedics;  Laterality: Right;   AMPUTATION Right 03/11/2022   Procedure: RIGHT LEG DEBRIDEMENT VS. BELOW KNEE AMPUTATION;  Surgeon: Newt Minion, MD;  Location: [place];  Service: Orthopedics;  Laterality: Right;   CARDIAC CATHETERIZATION N/A 09/08/2016   Procedure: Left Heart Cath and Coronary Angiography;  Surgeon: Adrian Prows, MD;  Location: [place] CV LAB;  Service: Cardiovascular;  Laterality: N/A;   CATARACT EXTRACTION, BILATERAL  09/2017, 10/2017   Dr. Herbert Deaner   COLONOSCOPY W/ BIOPSIES AND POLYPECTOMY     CORONARY/GRAFT ACUTE MI REVASCULARIZATION N/A 10/11/2020   Procedure: Coronary/Graft Acute MI Revascularization;  Surgeon: Adrian Prows, MD;  Location: [place] CV LAB;  Service: Cardiovascular;  Laterality: N/A;   I & D EXTREMITY Right 03/11/2022   Procedure: BELOW KNEE AMPUTATION;  Surgeon: Newt Minion, MD;  Location: [place];  Service: Orthopedics;  Laterality: Right;   LEFT HEART CATH N/A 12/31/2020   Procedure: Left Heart Cath;  Surgeon: Adrian Prows, MD;  Location: [place] CV LAB;  Service: Cardiovascular;  Laterality: N/A;   LEFT HEART CATH AND CORONARY ANGIOGRAPHY N/A 10/11/2020   Procedure: LEFT HEART CATH AND CORONARY ANGIOGRAPHY;  Surgeon: Adrian Prows, MD;  Location: [place] CV LAB;  Service: Cardiovascular;  Laterality: N/A;   LOWER EXTREMITY ANGIOGRAM Bilateral 04/18/2012   Procedure: LOWER EXTREMITY ANGIOGRAM;  Surgeon: Angelia Mould, MD;  Location: [facility] CATH LAB;  Service: Cardiovascular;  Laterality: Bilateral;  bilat lower extrem angio   LOWER EXTREMITY ANGIOGRAPHY Bilateral 05/02/2019   Procedure: LOWER EXTREMITY ANGIOGRAPHY;  Surgeon: Adrian Prows, MD;  Location: [place] CV LAB;  Service: Cardiovascular;  Laterality: Bilateral;   LOWER EXTREMITY ANGIOGRAPHY Bilateral 02/28/2019   Procedure: LOWER EXTREMITY ANGIOGRAPHY;  Surgeon: Adrian Prows, MD;  Location: [place] CV LAB;  Service: Cardiovascular;  Laterality: Bilateral;    macular photocoagulation     (eye treatments for diabetic retinopathy)-Dr. Zigmund Daniel   PERIPHERAL VASCULAR INTERVENTION  02/28/2019   Procedure: PERIPHERAL VASCULAR INTERVENTION;  Surgeon: Adrian Prows, MD;  Location: [place] CV LAB;  Service: Cardiovascular;;   TEE WITHOUT CARDIOVERSION N/A 03/18/2022   Procedure: TRANSESOPHAGEAL ECHOCARDIOGRAM (TEE);  Surgeon: Adrian Prows, MD;  Location: [facility] ENDOSCOPY;  Service: Cardiovascular;  Laterality: N/A;   VENTRICULAR ASSIST DEVICE INSERTION N/A 12/31/2020   Procedure: VENTRICULAR ASSIST DEVICE INSERTION;  Surgeon: Adrian Prows, MD;  Location: [place] CV LAB;  Service: Cardiovascular;  Laterality: N/A;    Family History  Problem Relation Age of Onset   Diabetes Mother    Hearing loss Mother    Hypertension Mother    Hyperlipidemia Mother    Heart disease Mother    Varicose Veins Mother    Varicose Veins Father    Dementia Father    Hyperlipidemia Brother    Diabetes Maternal Grandmother    Social History   Tobacco Use   Smoking status: Former    Packs/day: 1.00    Years: 30.00    Pack years: 30.00    Types: Cigarettes    Quit date: 01/08/2012    Years since quitting: 10.2   Smokeless tobacco: Never  Substance Use Topics   Alcohol use: Not  Currently    Comment: rare   Marital Status: Widowed ROS  Review of Systems  Constitutional: Negative for malaise/fatigue and weight gain.  Cardiovascular:  Positive for leg swelling (chronic, stable). Negative for chest pain, claudication, near-syncope, orthopnea, palpitations, paroxysmal nocturnal dyspnea and syncope.  Respiratory:  Negative for shortness of breath.   Neurological:  Negative for dizziness.  Objective  There were no vitals taken for this visit.     03/31/2022    4:39 AM 03/30/2022    7:50 PM 03/30/2022    1:48 PM  Vitals with BMI  Systolic 924 96 95  Diastolic 61 70 56  Pulse 70 73 69     Physical Exam Vitals reviewed.  Constitutional:      Appearance: He is  well-developed.     Comments: Morbidly obese, in no acute distress  Neck:     Thyroid: No thyromegaly.     Comments: Short neck and difficult to evaluate JVP Cardiovascular:     Pulses:          Carotid pulses are 2+ on the right side and 2+ on the left side.      Popliteal pulses are 0 on the right side and 2+ on the left side.       Dorsalis pedis pulses are 1+ on the right side and 2+ on the left side.       Posterior tibial pulses are 1+ on the right side and 1+ on the left side.     Heart sounds: No murmur heard.   No gallop.  Pulmonary:     Effort: Pulmonary effort is normal.     Breath sounds: No wheezing, rhonchi or rales.  Abdominal:     Comments: Obese. Pannus present  Musculoskeletal:        General: No tenderness.     Right lower leg: Edema (trace) present.     Left lower leg: Edema (trace) present.  Neurological:     Mental Status: He is alert.   Laboratory examination:   Recent Labs    03/21/22 0427 03/23/22 0813 03/30/22 0035  NA 135 136 138  K 3.9 4.1 3.9  CL 102 106 108  CO2 26 23 23   GLUCOSE 181* 118* 79  BUN 31* 31* 25*  CREATININE 1.42* 1.36* 1.34*  CALCIUM 8.2* 8.4* 8.3*  GFRNONAA 54* 57* 58*    estimated creatinine clearance is 68.6 mL/min (A) (by C-G formula based on SCr of 1.34 mg/dL (H)).     Latest Ref Rng & Units 03/30/2022   12:35 AM 03/23/2022    8:13 AM 03/21/2022    4:27 AM  CMP  Glucose 70 - 99 mg/dL 79   118   181    BUN 8 - 23 mg/dL 25   31   31     Creatinine 0.61 - 1.24 mg/dL 1.34   1.36   1.42    Sodium 135 - 145 mmol/L 138   136   135    Potassium 3.5 - 5.1 mmol/L 3.9   4.1   3.9    Chloride 98 - 111 mmol/L 108   106   102    CO2 22 - 32 mmol/L 23   23   26     Calcium 8.9 - 10.3 mg/dL 8.3   8.4   8.2    Total Protein 6.5 - 8.1 g/dL  6.0     Total Bilirubin 0.3 - 1.2 mg/dL  0.8     Alkaline Phos  38 - 126 U/L  60     AST 15 - 41 U/L  27     ALT 0 - 44 U/L  35         Latest Ref Rng & Units 03/30/2022   12:35 AM 03/23/2022     8:13 AM 03/21/2022    4:27 AM  CBC  WBC 4.0 - 10.5 K/uL 5.5   5.5   5.2    Hemoglobin 13.0 - 17.0 g/dL 11.8   12.0   12.0    Hematocrit 39.0 - 52.0 % 35.3   37.5   37.1    Platelets 150 - 400 K/uL 207   303   277     Lipid Panel     Component Value Date/Time   CHOL 96 (L) 08/18/2021 1538   TRIG 138 08/18/2021 1538   HDL 30 (L) 08/18/2021 1538   CHOLHDL 3.9 06/26/2021 0949   CHOLHDL 4.4 09/28/2011 1358   VLDL 21 09/28/2011 1358   LDLCALC 42 08/18/2021 1538    HEMOGLOBIN A1C Lab Results  Component Value Date   HGBA1C 7.3 (H) 03/11/2022   MPG 162.81 03/11/2022   TSH No results for input(s): TSH in the last 8760 hours.  External Labs:  Glucose Random 453.000 M 09/08/2019 BUN 38.000 M 09/08/2019 Creatinine, Serum 1.340 MG/ 09/08/2019  PSA 3.300 06/12/2019 01/19/2018: Cholesterol 115, triglycerides 112, HDL 39, LDL 54.  Glucose 125, creatinine 1.16, potassium 5.2, EGFR 67, CMP otherwise normal.  CBC normal.  TSH 1.5.  Allergies   Allergies  Allergen Reactions   Shellfish-Derived Products Nausea And Vomiting and Other (See Comments)    Only Mussels cause severe nausea and vomiting    Latex Rash   Tape Rash and Other (See Comments)    Caused issues with the skin   Testosterone Rash    Other reaction(s): did not feel well Other reaction(s): did not feel well    Medications Prior to Visit:   Outpatient Medications Prior to Visit  Medication Sig Dispense Refill   acetaminophen (TYLENOL) 325 MG tablet Take 1-2 tablets (325-650 mg total) by mouth every 4 (four) hours as needed for mild pain.     ascorbic acid (VITAMIN C) 1000 MG tablet Take 1 tablet (1,000 mg total) by mouth daily.     aspirin 81 MG chewable tablet Chew 1 tablet (81 mg total) by mouth daily.     Bioflavonoid Products (ESTER C PO) Take 1,000 mg by mouth daily.     Cholecalciferol (VITAMIN D) 2000 units CAPS Take 2,000 Units by mouth daily.     clopidogrel (PLAVIX) 75 MG tablet TAKE 1 TABLET BY MOUTH  DAILY 90 tablet 1   ENTRESTO 24-26 MG TAKE 1 TABLET BY MOUTH  TWICE DAILY (Patient taking differently: Take 1 tablet by mouth 2 (two) times daily.) 180 tablet 1   ezetimibe (ZETIA) 10 MG tablet Take 1 tablet (10 mg total) by mouth daily. 90 tablet 3   gemfibrozil (LOPID) 600 MG tablet Take 600 mg by mouth 2 (two) times daily before a meal.     HUMALOG KWIKPEN 100 UNIT/ML KiwkPen Inject 0-10 Units into the skin See admin instructions. Inject 0-10 units into the skin three times a day, per sliding scale- based on BGL >100  1   HUMULIN N KWIKPEN 100 UNIT/ML Kiwkpen Inject 30 Units into the skin See admin instructions. Inject 30 units into the skin before breakfast and 15 U before supper  1   Krill Oil 300  MG CAPS      melatonin 10 MG TABS Take 10 mg by mouth at bedtime.  0   Multiple Vitamin (MULTIVITAMIN WITH MINERALS) TABS tablet Take 1 tablet by mouth daily.     nitroGLYCERIN (NITROSTAT) 0.4 MG SL tablet Place 1 tablet (0.4 mg total) under the tongue every 5 (five) minutes as needed for chest pain. 15 tablet 3   oxyCODONE (OXY IR/ROXICODONE) 5 MG immediate release tablet Take 1-2 tablets (5-10 mg total) by mouth every 4 (four) hours as needed for moderate pain (pain score 4-6). 30 tablet 0   Probiotic Product (PROBIOTIC-10 PO) Take 1 capsule by mouth daily.     rosuvastatin (CRESTOR) 20 MG tablet Take 1 tablet (20 mg total) by mouth daily. 90 tablet 3   tirzepatide (MOUNJARO) 5 MG/0.5ML Pen Inject 5 mg into the skin every Friday.     torsemide (DEMADEX) 10 MG tablet Take 1 tablet (10 mg total) by mouth daily. 30 tablet 0   No facility-administered medications prior to visit.   Final Medications at End of Visit    No outpatient medications have been marked as taking for the 04/07/22 encounter (Appointment) with Rayetta Pigg, Anjoli Diemer C, PA-C.   Radiology:   DG CHEST PORT 1 VIEW 10/12/2020: COMPARISON:  10/11/2020 FINDINGS: Vascular congestion with bilateral lower lung zone airspace opacities are  without change. Upper lungs are clear. Suspect small effusions.  No pneumothorax. Endotracheal tube, right internal jugular central venous line and nasal/orogastric tube are stable. IMPRESSION: 1. No change from the previous day's exam. 2. Persistent vascular congestion and lower lung zone airspace opacities, the latter finding likely due to a combination of pleural fluid with atelectasis, with a possible component of either edema or infection.   Cardiac Studies:     Exercise sestamibi stress test 08/17/2016: 1. Resting EKG demonstrates normal sinus rhythm, left axis deviation, left plantar fascicular block.  Poor R-wave progression, ulnar disease pattern.  Nonspecific ST-T abnormality, cannot exclude lateral ischemia.  Stress EKG is equivocal for ischemia, patient developing atypical left otherwise for with exercise which reverted back to baseline immediately on combination of the stress test less than 60 seconds.  There was no additional ST-T wave changes of ischemia. Patient exercised on Bruce protocol for 4:25 minutes and achieved 5.45 METS. Stress test terminated due to 89 % MPHR achieved (Target HR >85%). Symptoms included dizziness.  2.  2-Day protocol followed. Perfusion imaging studies demonstrate large sized severe perfusion defect involving the inferior, anterior and anterolateral consistent with inferior wall scar with moderate peri-infarct ischemia and severe anterior and anteroapical reversible ischemia extending from the base towards the apex.  Left ventricular systolic function calculating by QGS was markedly depressed at 26%.  This is a high risk study, consider further cardiac work-up.   Carotid artery duplex 02/16/2018: No hemodynamically significant arterial disease in the internal carotid artery bilaterally. Mild heterogenous plaque noted.  Antegrade right vertebral artery flow. Antegrade left vertebral artery flow. Compared to the study done on 09/01/2016, left carotid noted as  occlusion is an error. This may perhaps be due to low velocity noted in both carotid arteries and may indicate low systemic BP or reduced cardiac output. Clinical correlation recommended.  Peripheral arteriogram 05/02/2019: Right common femoral artery and right proximal SFA previously placed stent widely patent.  Right iliac artery shows mild disease. Left iliac artery and left femoral arteries show very mild disease.  There is two-vessel runoff in the left leg, left AT is occluded. Intermediate stenosis noted  in the left distal and proximal SFA, pressure pullback reveals no significant gradient.   ABI 08/09/2019: This exam reveals normal perfusion of the right lower extremity (ABI). This exam reveals normal perfusion of the left lower extremity (ABI).  The ABI may be falsely elevated due to medial calcinosis from diabetes. No significant change since 05/11/2019. Patient has h/o right SFA stenting.   Left Heart Catheterization 10/11/20:  LV: Severely dilated.  Global hypokinesis.  Hand contrast injection hence not fully adequately visualized.  LVEF 15 to 20%.  EDP markedly elevated at 29 mmHg.  No pressure gradient across the aortic valve. Left main: Normal. LAD: Severely diffusely diseased.  Gives origin to large D1 which gives collaterals to the LAD and 2 smaller diagonals, LAD is occluded after the origin of D1, anatomy compared to prior in 2017 reveals progression of diffuse disease.  There are ipsilateral and contralateral collaterals to the LAD. CX: Moderate sized vessel, giving origin to large OM1.  OM1 is occluded in the ostium as was noted previously and has ipsilateral collaterals.  OM1 is diffusely diseased and faintly filled. RCA: Moderate disease in the proximal and mid segment.  At the bifurcation of PDA and PL, there is a high-grade 90% stenosis.  The bifurcation is also involved with at least a 90% stenosis in the PDA and a 60 to 70% stenosis in the PL branch.  There is no target as  distally as the PL branch which is large is occluded distally and that was new from 2017.  There are faint collaterals noted from the left to the RCA.   Impression: Severe native vessel three-vessel coronary artery disease with no significant targets for revascularization, LAD may be amenable for revascularization but I am not sure that this would help, anatomy similar to 2017 but progression to diffuse disease.  Patient is extremely ill with high risk for mortality.  He also has underlying stage III-IV kidney disease and contrast nephropathy needs to be evaluated further in view of contrast load of 70 mL.  Coronary Angioplasty 12/31/20 Impella support high risk intervention  Unsuccessful attempt at proximal LAD CTO intervention in spite of use of support catheters and multiple wires. 129 ml contrast used. No immediate complications.   Continue guideline directed medical therapy for heart failure discuss regarding any future attempts of revascularization.  Lower Extremity Arterial Duplex 08/14/2021:  There is monophasic waveform pattern throughout the right lower extremity  arterial system suggesting significant proximal (Iliac artery) stenosis.  Right SFA stents appear to be widely patent.  No hemodynamically significant stenosis are identified in the left lower  extremity arterial system.  This exam reveals normal perfusion of the right lower extremity (ABI 1.00)  and mildly decreased perfusion of the left lower extremity, noted at the  post tibial artery level (ABI 0.84).  There is monophasic waveform pattern  in bilateral lower extremity at the level of the ankles. Medial calcinosis  noted in bilateral small vessels.  ABI can be falsely elevated in patients with medial calcinosis. Compared  to 04/07/2019, no change in right ABI, left ABI was 1.00.  Echocardiogram 03/18/2022:   1. Left ventricular ejection fraction, by estimation, is 20 to 25%. The  left ventricle has severely decreased  function. The left ventricle  demonstrates global hypokinesis. The left ventricular internal cavity size  was severely dilated. Left ventricular  diastolic function could not be evaluated.   2. Right ventricular systolic function is normal. The right ventricular  size is normal.  3. Left atrial size was severely dilated. No left atrial/left atrial  appendage thrombus was detected.   4. The mitral valve is normal in structure. Mild to moderate mitral valve  regurgitation. No evidence of mitral stenosis.   5. Mild calcification of the non coronary cusp. The aortic valve is  tricuspid. There is mild calcification of the aortic valve. Aortic valve  regurgitation is trivial.  Conclusion(s)/Recommendation(s): No evidence of vegetation/infective  endocarditis on this transesophageael echocardiogram.   EKG  ***   02/17/2022: Sinus rhythm at a rate of 72 bpm.  Left axis.  Inferior infarct old.  Anterior septal infarct old.  Diffuse nonspecific T wave abnormality.  08/11/2020: Sinus tachycardia at rate of 120 bpm, atypical left bundle branch block.  No further analysis.   09/13/2020: Normal sinus rhythm at rate of 91 bpm, left atrial enlargement, inferior infarct old.  Anteroseptal infarct old.  Nonspecific T abnormality.  Compared to 08/11/2020, left bundle branch block not present.  04/19/2020: Normal sinus rhythm at rate of 61 bpm, inferior infarct old.  Anteroseptal infarct old.  Nonspecific T abnormality.  Borderline low voltage complexes.  No significant change from 11/08/2019.   Assessment   No diagnosis found.  No orders of the defined types were placed in this encounter.  There are no discontinued medications.      Recommendations:    YAASIR MENKEN  is a 66 y.o. Caucasian male  with controlled type 2 diabetes, hypertension, hyperlipidemia, coronary artery disease by angiography on 09/08/2016 revealing severe triple-vessel CAD with no targets for CABG, he presented with new onset left  bundle branch block and acute fulminant pulmonary edema needing intubation on 10/11/2020, emergent cardiac catheterization essentially revealing progression of severe native vessel disease with no significant option for revascularization.  Although LAD was felt to be amenable for potential revascularization, it was unchanged from prior cardiac catheterization and RCA was felt to be the culprit, in view of diffuse disease medical management was recommended.  Past medical history is also significant for morbid obesity, diabetes mellitus with stage III AV chronic kidney disease, mild carotid atherosclerosis, right SFA occlusion S/P complex PV angiogram on 02/28/2019 with PTA to occluded right SFA with implantation of 3 overlapping 6.0 x 120 x2; 6.0 x 100 mm Eluvia DES for critical right leg ischemia now resolved. 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 He has mild disease in the left leg.  PAD has remained stable. Due to frequent PVCs, he was also started on amiodarone prophylactically.   Patient was again hospitalized 12/28/2020-01/02/2021 for acute on chronic systolic heart failure he diuresed well and underwent unsuccessful attempt to proximal LAD CTO intervention, was again recommended continued medical therapy.  Due to renal insufficiency while on spironolactone, this has been discontinued.   Patient was last seen in the office 02/17/2022 at which time he was stable from a cardiovascular standpoint and advised to follow-up in 6 months.  However patient subsequently was hospitalized 03/08/2022 - 03/21/2022 with septic shock secondary to strep bacteremia from diabetic foot infection.  During hospitalization patient underwent right BKA by Dr. Sharol Given on 03/12/2022.  Patient now presents for hospital follow-up and management of heart failure. ***  ***  Patient presents for 39-monthfollow-up.  At last office visit patient was relatively stable from a cardiovascular standpoint, therefore no  changes were made.  Patient continues to feel well overall.  He is tolerating guideline directed medical therapy without issue.  Patient's blood pressure is soft in  the office today, however he is asymptomatic, therefore we will continue present medications.  Counseled patient regarding signs and symptoms that would warrant reevaluation.  There is no clinical evidence of heart failure at today's office visit.  Follow-up in 6 months, sooner if needed.   Alethia Berthold, PA-C 04/03/2022, 9:37 AM Office: [phone]

## 2022-04-04 ENCOUNTER — Encounter: Payer: Self-pay | Admitting: Physical Medicine and Rehabilitation

## 2022-04-07 ENCOUNTER — Encounter: Payer: Self-pay | Admitting: Orthopedic Surgery

## 2022-04-07 ENCOUNTER — Ambulatory Visit: Payer: Medicare Other | Admitting: Student

## 2022-04-07 ENCOUNTER — Telehealth: Payer: Self-pay | Admitting: Family

## 2022-04-07 ENCOUNTER — Encounter: Payer: Self-pay | Admitting: Student

## 2022-04-07 ENCOUNTER — Other Ambulatory Visit: Payer: Self-pay | Admitting: Orthopedic Surgery

## 2022-04-07 VITALS — BP 99/62 | HR 96 | Temp 97.9°F | Resp 16 | Ht 70.0 in | Wt 260.0 lb

## 2022-04-07 DIAGNOSIS — I5022 Chronic systolic (congestive) heart failure: Secondary | ICD-10-CM | POA: Diagnosis not present

## 2022-04-07 DIAGNOSIS — I255 Ischemic cardiomyopathy: Secondary | ICD-10-CM

## 2022-04-07 MED ORDER — OXYCODONE HCL 5 MG PO TABS: 5.0000 mg | ORAL_TABLET | Freq: Four times a day (QID) | ORAL | 0 refills | Status: DC | PRN

## 2022-04-07 NOTE — Telephone Encounter (Signed)
Pt called requesting a refill of oxycodone. Please send to pharmacy on file. Pt phone number is [phone].

## 2022-04-07 NOTE — Progress Notes (Signed)
Office Visit Note   Patient: Eduardo [name]           Date of Birth: 04-27-1956           MRN: [id number] Visit Date: 04/02/2022              Requested by: Rita Ohara, [address],  [place] [address] PCP: Rita Ohara, MD  Chief Complaint  Patient presents with   Right Leg - Routine Post Op    03/11/22 right BKA Staples and wound vac removed at hospital Wearing shrinker and limb protector Photos taken Pain scale 1 out of 10      HPI: Patient is a 66 year old gentleman is 3 weeks status post right transtibial amputation.  Patient states that his pain is a 1 out of 10.  Assessment & Plan: Visit Diagnoses:  1. S/P below knee amputation, right ([place])     Plan: Patient currently is a 4 extra-large shrinker recommended a 3 extra-large shrinker.  AP and lateral radiograph of the right tibia and fibula at follow-up.  Follow-Up Instructions: Return in about 2 weeks (around 04/16/2022).   Ortho Exam  Patient is alert, oriented, no adenopathy, well-dressed, normal affect, normal respiratory effort. Examination the incision is well-healed there is no redness no cellulitis no drainage there is good consolidation of the residual limb.  Imaging: No results found.   Labs: Lab Results  Component Value Date   HGBA1C 7.3 (H) 03/11/2022   HGBA1C 6.4 12/23/2021   HGBA1C 6.5 03/19/2021   REPTSTATUS 03/17/2022 FINAL 03/12/2022   GRAMSTAIN  09/16/2021    FEW WBC PRESENT, PREDOMINANTLY MONONUCLEAR RARE GRAM POSITIVE COCCI FEW GRAM NEGATIVE RODS    CULT  03/12/2022    NO GROWTH 5 DAYS Performed at [facility], [place] [address], [place], [place] [address]    LABORGA STREPTOCOCCUS ANGINOSIS 03/08/2022     Lab Results  Component Value Date   ALBUMIN 2.6 (L) 03/23/2022   ALBUMIN 2.1 (L) 03/11/2022   ALBUMIN 2.4 (L) 03/08/2022   PREALBUMIN 6.5 (L) 03/11/2022    Lab Results  Component Value Date   MG 2.2 03/11/2022   MG 2.2 03/10/2022   MG 2.0 03/09/2022    Lab Results  Component Value Date   VD25OH 75.05 03/11/2022   VD25OH 54.2 12/05/2021    Lab Results  Component Value Date   PREALBUMIN 6.5 (L) 03/11/2022      Latest Ref Rng & Units 03/30/2022   12:35 AM 03/23/2022    8:13 AM 03/21/2022    4:27 AM  CBC EXTENDED  WBC 4.0 - 10.5 K/uL 5.5   5.5   5.2    RBC 4.22 - 5.81 MIL/uL 3.87   4.07   4.06    Hemoglobin 13.0 - 17.0 g/dL 11.8   12.0   12.0    HCT 39.0 - 52.0 % 35.3   37.5   37.1    Platelets 150 - 400 K/uL 207   303   277    NEUT# 1.7 - 7.7 K/uL  3.0     Lymph# 0.7 - 4.0 K/uL  1.6        There is no height or weight on file to calculate BMI.  Orders:  No orders of the defined types were placed in this encounter.  No orders of the defined types were placed in this encounter.    Procedures: No procedures performed  Clinical Data: No additional findings.  ROS:  All other  systems negative, except as noted in the HPI. Review of Systems  Objective: Vital Signs: There were no vitals taken for this visit.  Specialty Comments:  No specialty comments available.  PMFS History: Patient Active Problem List   Diagnosis Date Noted   Insomnia    Constipation    S/P BKA (below knee amputation), right ([place]) 03/21/2022   Ischemic cardiomyopathy    AKI (acute kidney injury) ([place]) 03/13/2022   Streptococcal bacteremia 03/13/2022   Hypophosphatemia 03/13/2022   Septic shock ([place]) 90/30/0923   Acute systolic heart failure ([place]) 12/28/2020   Acute pulmonary edema (HCC) 10/11/2020   Non-ST elevation (NSTEMI) myocardial infarction ([place]) 10/11/2020   Acute respiratory distress 10/11/2020   Acute on chronic combined systolic and diastolic CHF (congestive heart failure) ([place]) 10/11/2020   Cardiogenic shock (HCC)    Partial nontraumatic amputation of foot, right ([place]) 06/21/2019   Osteomyelitis of right foot ([place])    Ischemic ulcer of lower leg due to atherosclerosis ([place]) 02/28/2019   Essential hypertension, benign  02/06/2019   Hypercholesteremia 02/06/2019   Stage 3b chronic kidney disease (CKD) ([place]) 10/06/2017   Proteinuria 10/06/2017   Controlled type 2 diabetes mellitus with hyperglycemia, with long-term current use of insulin ([place]) 10/06/2017   Coronary artery disease involving native coronary artery of native heart without angina pectoris 05/23/2017   Abnormal nuclear stress test 09/06/2016   PAD (peripheral artery disease) ([place]) 10/28/2012   Morbid obesity with BMI of 40.0-44.9, adult ([place]) 10/13/2012   Diabetes mellitus with ophthalmic complication ([place]) 30/05/6225   Hypertension associated with diabetes ([place]) 10/13/2012   Atherosclerosis of native artery of extremity with intermittent claudication ([place]) 04/05/2012   Adenomatous colon polyp 09/28/2011   Diabetes mellitus ([place]) 09/28/2011   Pure hyperglyceridemia 09/28/2011   Erectile dysfunction 09/28/2011   Diabetic retinopathy ([place]) 09/28/2011   Past Medical History:  Diagnosis Date   CHF (congestive heart failure) ([place])    Chronic kidney disease    stage 3   Colon polyp    Coronary artery disease    Diabetes mellitus 1987   under care of Dr. Chalmers Cater.  On insulin since 96 (off and on)   Diabetic retinopathy    Dupuytren contracture    R hand, s/p injection (Dr. Lenon Curt)   Essential hypertension, benign    Essential hypertension, benign 02/06/2019   Frequency of urination and polyuria    Hypertension    Myocardial infarction [facility])    denies   Neuromuscular disorder ([place])    Diabetic neuropathy   Osteomyelitis ([place])    right foot   Other testicular hypofunction    Peripheral arterial disease ([place]) 10/28/2012   Peritoneal abscess ([place]) 6/08   and buttock.   Pneumonia    Polydipsia    Proteinuria    Pure hyperglyceridemia    Subacute osteomyelitis, right ankle and foot (HCC)    Wears glasses     Family History  Problem Relation Age of Onset   Diabetes Mother    Hearing loss Mother    Hypertension Mother     Hyperlipidemia Mother    Heart disease Mother    Varicose Veins Mother    Varicose Veins Father    Dementia Father    Hyperlipidemia Brother    Diabetes Maternal Grandmother     Past Surgical History:  Procedure Laterality Date   ABDOMINAL AORTAGRAM N/A 04/18/2012   Procedure: ABDOMINAL Maxcine Ham;  Surgeon: Angelia Mould, MD;  Location: [facility] CATH LAB;  Service: Cardiovascular;  Laterality: N/A;  AMPUTATION Right 05/19/2019   Procedure: RIGHT FOOT 5TH RAY AMPUTATION;  Surgeon: Newt Minion, MD;  Location: [place];  Service: Orthopedics;  Laterality: Right;   AMPUTATION Right 03/11/2022   Procedure: RIGHT LEG DEBRIDEMENT VS. BELOW KNEE AMPUTATION;  Surgeon: Newt Minion, MD;  Location: [facility];  Service: Orthopedics;  Laterality: Right;   CARDIAC CATHETERIZATION N/A 09/08/2016   Procedure: Left Heart Cath and Coronary Angiography;  Surgeon: Adrian Prows, MD;  Location: [place] CV LAB;  Service: Cardiovascular;  Laterality: N/A;   CATARACT EXTRACTION, BILATERAL  09/2017, 10/2017   Dr. Herbert Deaner   COLONOSCOPY W/ BIOPSIES AND POLYPECTOMY     CORONARY/GRAFT ACUTE MI REVASCULARIZATION N/A 10/11/2020   Procedure: Coronary/Graft Acute MI Revascularization;  Surgeon: Adrian Prows, MD;  Location: [facility] CV LAB;  Service: Cardiovascular;  Laterality: N/A;   I & D EXTREMITY Right 03/11/2022   Procedure: BELOW KNEE AMPUTATION;  Surgeon: Newt Minion, MD;  Location: [place];  Service: Orthopedics;  Laterality: Right;   LEFT HEART CATH N/A 12/31/2020   Procedure: Left Heart Cath;  Surgeon: Adrian Prows, MD;  Location: [place] [facility] CV LAB;  Service: Cardiovascular;  Laterality: N/A;   LEFT HEART CATH AND CORONARY ANGIOGRAPHY N/A 10/11/2020   Procedure: LEFT HEART CATH AND CORONARY ANGIOGRAPHY;  Surgeon: Adrian Prows, MD;  Location: [facility] CV LAB;  Service: Cardiovascular;  Laterality: N/A;   LOWER EXTREMITY ANGIOGRAM Bilateral 04/18/2012   Procedure: LOWER EXTREMITY ANGIOGRAM;  Surgeon: Angelia Mould, MD;  Location: [facility] CATH LAB;  Service: Cardiovascular;  Laterality: Bilateral;  bilat lower extrem angio   LOWER EXTREMITY ANGIOGRAPHY Bilateral 05/02/2019   Procedure: LOWER EXTREMITY ANGIOGRAPHY;  Surgeon: Adrian Prows, MD;  Location: [place] CV LAB;  Service: Cardiovascular;  Laterality: Bilateral;   LOWER EXTREMITY ANGIOGRAPHY Bilateral 02/28/2019   Procedure: LOWER EXTREMITY ANGIOGRAPHY;  Surgeon: Adrian Prows, MD;  Location: [place] [facility] LAB;  Service: Cardiovascular;  Laterality: Bilateral;   macular photocoagulation     (eye treatments for diabetic retinopathy)-Dr. Zigmund Daniel   PERIPHERAL VASCULAR INTERVENTION  02/28/2019   Procedure: PERIPHERAL VASCULAR INTERVENTION;  Surgeon: Adrian Prows, MD;  Location: [facility] CV LAB;  Service: Cardiovascular;;   TEE WITHOUT CARDIOVERSION N/A 03/18/2022   Procedure: TRANSESOPHAGEAL ECHOCARDIOGRAM (TEE);  Surgeon: Adrian Prows, MD;  Location[facility] ENDOSCOPY;  Service: Cardiovascular;  Laterality: N/A;   VENTRICULAR ASSIST DEVICE INSERTION N/A 12/31/2020   Procedure: VENTRICULAR ASSIST DEVICE INSERTION;  Surgeon: Adrian Prows, MD;  Location: [place] CV LAB;  Service: Cardiovascular;  Laterality: N/A;   Social History   Occupational History   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

## 2022-04-07 NOTE — Telephone Encounter (Signed)
Oxy '5mg'$  q4h last filled 03/31/22 by Risa Grill, PA w/ vascular surgery Pt is s/p right BKA 03/11/22

## 2022-04-07 NOTE — Progress Notes (Unsigned)
  Patient presents for hospital follow-up. He was admitted 03/08/22 with septic shock, related to diabetic foot infection, osteomyelitis. He was in the ICU on pressors initially. He completed course of ABX for Streptococcus anginosus bacteremia.  He underwent R BKA by Dr. Sharol Given on 03/11/22. TEE was negative for endocarditis. EF was 30-35%.  He completed ABX on 5/13, and was discharged from the hospital, to rehab.  He remained in rehab through 03/31/22. In rehab, it was noted that his BP was on the low end of normal on Entresto and Torsemide.  Coreg and Imdur were held, torsemide dose decreased to $RemoveBefo'10mg'[id number]$  daily after hypotension on 5/21.  He saw cardiology in follow-up yesterday, and no changes were made to his medications (BP remained low, but asymptomatic). BP Readings from Last 3 Encounters:  04/07/22 99/62  03/31/22 101/61  03/21/22 (!) 97/55    Melatonin 10 mg and trazodone started for insomnia while in rehab   DM is managed by Dr. Chalmers Cater.  He saw Dr. Sharol Given for follow-up, and wound looked good.  He has noted a large amount of bruising on his R chest wall.    PMH, PSH, SH reviewed   ROS: no fever, chills, headache, chest pain, palpitations, shortness of breath. Denies dizziness.  Denies edema, n/v/d. Constipation? Insomnia? Bruising at R chest wall   PHYSICAL EXAM:  There were no vitals taken for this visit.  Wt Readings from Last 3 Encounters:  04/07/22 260 lb (117.9 kg)  03/28/22 251 lb 12.3 oz (114.2 kg)  03/21/22 260 lb 9.3 oz (118.2 kg)      ASSESSMENT/PLAN:  CBC, PT/PTT, c-met (if not done for Dr. Chalmers Cater)

## 2022-04-08 ENCOUNTER — Ambulatory Visit (INDEPENDENT_AMBULATORY_CARE_PROVIDER_SITE_OTHER): Payer: Medicare Other | Admitting: Family Medicine

## 2022-04-08 ENCOUNTER — Encounter: Payer: Self-pay | Admitting: Family Medicine

## 2022-04-08 VITALS — BP 82/50 | HR 80 | Ht 70.0 in | Wt 260.0 lb

## 2022-04-08 DIAGNOSIS — Z5181 Encounter for therapeutic drug level monitoring: Secondary | ICD-10-CM | POA: Diagnosis not present

## 2022-04-08 DIAGNOSIS — I739 Peripheral vascular disease, unspecified: Secondary | ICD-10-CM | POA: Diagnosis not present

## 2022-04-08 DIAGNOSIS — E1121 Type 2 diabetes mellitus with diabetic nephropathy: Secondary | ICD-10-CM | POA: Diagnosis not present

## 2022-04-08 DIAGNOSIS — I959 Hypotension, unspecified: Secondary | ICD-10-CM

## 2022-04-08 DIAGNOSIS — I5022 Chronic systolic (congestive) heart failure: Secondary | ICD-10-CM

## 2022-04-08 DIAGNOSIS — R58 Hemorrhage, not elsewhere classified: Secondary | ICD-10-CM

## 2022-04-08 NOTE — Patient Instructions (Addendum)
Be sure to stay well hydrated. Contact me or your cardiologist if you start to feel dizzy. Expect the bruising to settle with gravity (down, across, if laying on side, etc).  Expect it to turn green, then yellow. I  marked the top and back portion so that we can be able to monitor if it is continuing to increase in size. I recommend icing and compression for the next couple of days, to try and stop any oozing that may be occurring. Based on the story, and fullness/tenderness at the upper chest,I suspect a possible pectoralis small tear, with ongoing oozing. If worsening, we can image this with ultrasound (via sports med docs vs formal US).

## 2022-04-09 ENCOUNTER — Encounter: Payer: Self-pay | Admitting: Family Medicine

## 2022-04-09 LAB — CBC WITH DIFFERENTIAL/PLATELET
Basophils Absolute: 0.1 10*3/uL (ref 0.0–0.2)
Basos: 1 %
EOS (ABSOLUTE): 0.3 10*3/uL (ref 0.0–0.4)
Eos: 5 %
Hematocrit: 35.6 % — ABNORMAL LOW (ref 37.5–51.0)
Hemoglobin: 11.8 g/dL — ABNORMAL LOW (ref 13.0–17.7)
Immature Grans (Abs): 0 10*3/uL (ref 0.0–0.1)
Immature Granulocytes: 0 %
Lymphocytes Absolute: 1.5 10*3/uL (ref 0.7–3.1)
Lymphs: 27 %
MCH: 30.2 pg (ref 26.6–33.0)
MCHC: 33.1 g/dL (ref 31.5–35.7)
MCV: 91 fL (ref 79–97)
Monocytes Absolute: 0.5 10*3/uL (ref 0.1–0.9)
Monocytes: 9 %
Neutrophils Absolute: 3.3 10*3/uL (ref 1.4–7.0)
Neutrophils: 58 %
Platelets: 196 10*3/uL (ref 150–450)
RBC: 3.91 x10E6/uL — ABNORMAL LOW (ref 4.14–5.80)
RDW: 14.2 % (ref 11.6–15.4)
WBC: 5.7 10*3/uL (ref 3.4–10.8)

## 2022-04-09 LAB — COMPREHENSIVE METABOLIC PANEL
ALT: 17 IU/L (ref 0–44)
AST: 21 IU/L (ref 0–40)
Albumin/Globulin Ratio: 1.3 (ref 1.2–2.2)
Albumin: 3.6 g/dL — ABNORMAL LOW (ref 3.8–4.8)
Alkaline Phosphatase: 68 IU/L (ref 44–121)
BUN/Creatinine Ratio: 15 (ref 10–24)
BUN: 24 mg/dL (ref 8–27)
Bilirubin Total: 0.6 mg/dL (ref 0.0–1.2)
CO2: 23 mmol/L (ref 20–29)
Calcium: 9.2 mg/dL (ref 8.6–10.2)
Chloride: 107 mmol/L — ABNORMAL HIGH (ref 96–106)
Creatinine, Ser: 1.62 mg/dL — ABNORMAL HIGH (ref 0.76–1.27)
Globulin, Total: 2.7 g/dL (ref 1.5–4.5)
Glucose: 165 mg/dL — ABNORMAL HIGH (ref 70–99)
Potassium: 5.6 mmol/L — ABNORMAL HIGH (ref 3.5–5.2)
Sodium: 142 mmol/L (ref 134–144)
Total Protein: 6.3 g/dL (ref 6.0–8.5)
eGFR: 47 mL/min/{1.73_m2} — ABNORMAL LOW (ref >59)

## 2022-04-09 LAB — PT AND PTT
INR: 1 (ref 0.9–1.2)
Prothrombin Time: 10.6 s (ref 9.1–12.0)
aPTT: 27 s (ref 24–33)

## 2022-04-15 ENCOUNTER — Encounter: Payer: Self-pay | Admitting: Family Medicine

## 2022-04-15 NOTE — Telephone Encounter (Signed)
Spoke with patient and advised him to stop ASA and Plavix per Dr Redmond School and scheduled him to follow up with Dr. Tomi Bamberger on Monday also per Dr. Redmond School.

## 2022-04-16 ENCOUNTER — Other Ambulatory Visit: Payer: Self-pay | Admitting: Orthopedic Surgery

## 2022-04-16 ENCOUNTER — Ambulatory Visit (INDEPENDENT_AMBULATORY_CARE_PROVIDER_SITE_OTHER): Payer: Medicare Other

## 2022-04-16 ENCOUNTER — Ambulatory Visit (INDEPENDENT_AMBULATORY_CARE_PROVIDER_SITE_OTHER): Payer: Medicare Other | Admitting: Orthopedic Surgery

## 2022-04-16 DIAGNOSIS — Z89511 Acquired absence of right leg below knee: Secondary | ICD-10-CM

## 2022-04-17 ENCOUNTER — Ambulatory Visit (INDEPENDENT_AMBULATORY_CARE_PROVIDER_SITE_OTHER): Payer: Medicare Other | Admitting: Medical

## 2022-04-17 ENCOUNTER — Other Ambulatory Visit: Payer: Self-pay | Admitting: Orthopedic Surgery

## 2022-04-17 ENCOUNTER — Ambulatory Visit
Admission: RE | Admit: 2022-04-17 | Discharge: 2022-04-17 | Disposition: A | Discharge status: Home / Self Care | Payer: Medicare Other | Source: Ambulatory Visit | Attending: Medical | Admitting: Medical

## 2022-04-17 VITALS — BP 104/60 | HR 99 | Temp 96.3°F | Wt 267.0 lb

## 2022-04-17 DIAGNOSIS — R0600 Dyspnea, unspecified: Secondary | ICD-10-CM

## 2022-04-17 DIAGNOSIS — R9430 Abnormal result of cardiovascular function study, unspecified: Secondary | ICD-10-CM

## 2022-04-17 DIAGNOSIS — E1159 Type 2 diabetes mellitus with other circulatory complications: Secondary | ICD-10-CM

## 2022-04-17 DIAGNOSIS — E1165 Type 2 diabetes mellitus with hyperglycemia: Secondary | ICD-10-CM

## 2022-04-17 DIAGNOSIS — R062 Wheezing: Secondary | ICD-10-CM

## 2022-04-17 DIAGNOSIS — Z89511 Acquired absence of right leg below knee: Secondary | ICD-10-CM

## 2022-04-17 DIAGNOSIS — R509 Fever, unspecified: Secondary | ICD-10-CM

## 2022-04-17 DIAGNOSIS — N1832 Chronic kidney disease, stage 3b: Secondary | ICD-10-CM

## 2022-04-17 DIAGNOSIS — I152 Hypertension secondary to endocrine disorders: Secondary | ICD-10-CM

## 2022-04-17 DIAGNOSIS — Z794 Long term (current) use of insulin: Secondary | ICD-10-CM | POA: Diagnosis not present

## 2022-04-17 DIAGNOSIS — I739 Peripheral vascular disease, unspecified: Secondary | ICD-10-CM | POA: Diagnosis not present

## 2022-04-17 LAB — BASIC METABOLIC PANEL
BUN/Creatinine Ratio: 30 — ABNORMAL HIGH (ref 10–24)
BUN: 42 mg/dL — ABNORMAL HIGH (ref 8–27)
CO2: 26 mmol/L (ref 20–29)
Calcium: 9 mg/dL (ref 8.6–10.2)
Chloride: 104 mmol/L (ref 96–106)
Creatinine, Ser: 1.4 mg/dL — ABNORMAL HIGH (ref 0.76–1.27)
Glucose: 205 mg/dL — ABNORMAL HIGH (ref 70–99)
Potassium: 5.2 mmol/L (ref 3.5–5.2)
Sodium: 137 mmol/L (ref 134–144)
eGFR: 55 mL/min/{1.73_m2} — ABNORMAL LOW (ref >59)

## 2022-04-17 LAB — CBC WITH DIFFERENTIAL/PLATELET
Basophils Absolute: 0.1 10*3/uL (ref 0.0–0.2)
Basos: 2 %
EOS (ABSOLUTE): 0.2 10*3/uL (ref 0.0–0.4)
Eos: 3 %
Hematocrit: 33.4 % — ABNORMAL LOW (ref 37.5–51.0)
Hemoglobin: 11 g/dL — ABNORMAL LOW (ref 13.0–17.7)
Lymphocytes Absolute: 1.3 10*3/uL (ref 0.7–3.1)
Lymphs: 20 %
MCH: 30.6 pg (ref 26.6–33.0)
MCHC: 32.9 g/dL (ref 31.5–35.7)
MCV: 93 fL (ref 79–97)
Monocytes Absolute: 0.4 10*3/uL (ref 0.1–0.9)
Monocytes: 6 %
Neutrophils Absolute: 4.3 10*3/uL (ref 1.4–7.0)
Neutrophils: 69 %
Platelets: 236 10*3/uL (ref 150–450)
RBC: 3.6 x10E6/uL — ABNORMAL LOW (ref 4.14–5.80)
RDW: 15.8 % — ABNORMAL HIGH (ref 11.6–15.4)
WBC: 6.3 10*3/uL (ref 3.4–10.8)

## 2022-04-17 MED ORDER — OXYCODONE HCL 5 MG PO TABS: 5.0000 mg | ORAL_TABLET | Freq: Four times a day (QID) | ORAL | 0 refills | Status: DC | PRN

## 2022-04-17 NOTE — Progress Notes (Signed)
Subjective:  Eduardo [name] is a 66 y.o. male who presents for Chief Complaint  Patient presents with   follow-up    Follow-up on bruising  Verdene Lennert CMA spoke to Dr. Redmond School and was told stop plavix and aspirin, now having some wheezing and low grade fever x 3 days     Here for concerns.   Here with wife.  Normally sees Dr. Rita Ohara here.  Medical team: Dr. Einar Gip, cardiology Dr. Chalmers Cater, endocrinology Dr. Hollie Salk, nephrology Dr. Zigmund Daniel, ophthalmology Dr. Sharol Given, orthopedics  Concerns: He has a history of congestive heart failure, chronic kidney disease, diabetes, coronary artery disease, hypertension, osteomyelitis of the right foot.  Of note he is status post right lower leg amputation on Mar 11, 2022 due to diabetic ulcer complication and osteomyelitis.  The same day he came home from the hospital after this amputation he was on the toilet and his weight shifted and he caught himself before falling however he did rupture his right pectoralis muscle.  He has had purplish bruising down his right torso for several days and some pain at the right pectoralis muscle that is curled up.  He called here about this few days ago and was told to stop Plavix and aspirin which she did.  He stopped these actually yesterday.  He also saw orthopedics in follow-up yesterday.  They agree with the plan to stop Plavix and aspirin short-term and to let the purplish bruising resolved or improved since a lot of this was extravasation from the ruptured muscle.  His main concern today is breathing x 3 days.  If lying flat or inclined, having trouble breathing.  If sitting up it is better.   Hasn't been sleeping that well, but concernd about the breathing.  Has phelgm in throat that will come up, some wheezing.  Thinks the wheezing is from the phlegm.  Had 99 temp this morning.  Did take some tylneol this morning.  No pain or swelling in left leg.   Used to be on torsedmie '20mg'$  BID prior to this recent May  hospitalization for amputation, but was changed to Torsemide '10mg'$  once daliy.  He notes 1 prior episode of CHF March 2022.  He has a history of chronic kidney disease, stage 3, but has been teetering close to stage 4.    Has hx/o stents in right leg, on plavix for leg and heart.    He has a bedside commode over toilet, grab bars, walker to assist with amulation and safety at home.   No other aggravating or relieving factors.    No other c/o.  Past Medical History:  Diagnosis Date   CHF (congestive heart failure) (HCC)    Chronic kidney disease    stage 3   Colon polyp    Coronary artery disease    Diabetes mellitus 1987   under care of Dr. Chalmers Cater.  On insulin since 96 (off and on)   Diabetic retinopathy    Dupuytren contracture    R hand, s/p injection (Dr. Lenon Curt)   Essential hypertension, benign    Essential hypertension, benign 02/06/2019   Frequency of urination and polyuria    Hypertension    Myocardial infarction [facility])    denies   Neuromuscular disorder ([place])    Diabetic neuropathy   Osteomyelitis ([place])    right foot   Other testicular hypofunction    Peripheral arterial disease ([place]) 10/28/2012   Peritoneal abscess ([place]) 6/08   and buttock.   Pneumonia  Polydipsia    Proteinuria    Pure hyperglyceridemia    Subacute osteomyelitis, right ankle and foot (HCC)    Wears glasses    Current Outpatient Medications on File Prior to Visit  Medication Sig Dispense Refill   acetaminophen (TYLENOL) 325 MG tablet Take 1-2 tablets (325-650 mg total) by mouth every 4 (four) hours as needed for mild pain.     Bioflavonoid Products (ESTER C PO) Take 1,000 mg by mouth daily.     Cholecalciferol (VITAMIN D) 2000 units CAPS Take 2,000 Units by mouth daily.     ENTRESTO 24-26 MG TAKE 1 TABLET BY MOUTH  TWICE DAILY (Patient taking differently: Take 1 tablet by mouth 2 (two) times daily.) 180 tablet 1   ezetimibe (ZETIA) 10 MG tablet Take 1 tablet (10 mg total) by mouth daily. 90  tablet 3   gemfibrozil (LOPID) 600 MG tablet Take 600 mg by mouth 2 (two) times daily before a meal.     HUMALOG KWIKPEN 100 UNIT/ML KiwkPen Inject 0-10 Units into the skin See admin instructions. Inject 0-10 units into the skin three times a day, per sliding scale- based on BGL >100  1   HUMULIN N KWIKPEN 100 UNIT/ML Kiwkpen Inject 30 Units into the skin See admin instructions. Inject 35 units into the skin before breakfast  1   Krill Oil 300 MG CAPS      melatonin 10 MG TABS Take 10 mg by mouth at bedtime.  0   Multiple Vitamin (MULTIVITAMIN WITH MINERALS) TABS tablet Take 1 tablet by mouth daily.     oxyCODONE (OXY IR/ROXICODONE) 5 MG immediate release tablet Take 1 tablet (5 mg total) by mouth every 6 (six) hours as needed for moderate pain (pain score 4-6). 30 tablet 0   Probiotic Product (PROBIOTIC-10 PO) Take 1 capsule by mouth daily.     rosuvastatin (CRESTOR) 20 MG tablet Take 1 tablet (20 mg total) by mouth daily. 90 tablet 3   tirzepatide (MOUNJARO) 5 MG/0.5ML Pen Inject 5 mg into the skin every Friday.     torsemide (DEMADEX) 10 MG tablet Take 1 tablet (10 mg total) by mouth daily. 30 tablet 0   aspirin 81 MG chewable tablet Chew 1 tablet (81 mg total) by mouth daily. (Patient not taking: Reported on 04/17/2022)     clopidogrel (PLAVIX) 75 MG tablet TAKE 1 TABLET BY MOUTH DAILY (Patient not taking: Reported on 04/17/2022) 90 tablet 1   nitroGLYCERIN (NITROSTAT) 0.4 MG SL tablet Place 1 tablet (0.4 mg total) under the tongue every 5 (five) minutes as needed for chest pain. (Patient not taking: Reported on 04/08/2022) 15 tablet 3   No current facility-administered medications on file prior to visit.     The following portions of the patient's history were reviewed and updated as appropriate: allergies, current medications, past family history, past medical history, past social history, past surgical history and problem list.  ROS Otherwise as in subjective above   Objective: BP 104/60    Pulse 99   Temp (!) 96.3 F (35.7 C)   Wt 267 lb (121.1 kg)   SpO2 98%   BMI 38.31 kg/m   Wt Readings from Last 3 Encounters:  04/17/22 267 lb (121.1 kg)  04/08/22 260 lb (117.9 kg)  04/07/22 260 lb (117.9 kg)   BP Readings from Last 3 Encounters:  04/17/22 104/60  04/08/22 (!) 82/50  04/07/22 99/62   General appearance: alert, no distress, well developed, well nourished HEENT: normocephalic, sclerae anicteric,  conjunctiva pink and moist, TMs pearly, nares patent, no discharge or erythema, pharynx normal Oral cavity: MMM, no lesions Neck: supple, no lymphadenopathy, no thyromegaly, no masses Heart: RRR, normal S1, S2, no murmurs Lungs: CTA bilaterally, no wheezes, rhonchi, or rales Abdomen: +bs, soft, non tender, non distended, no masses, no hepatomegaly, no splenomegaly Pulses: 2+ radial pulses, 2+ pedal pulses, normal cap refill Ext: no edema  Echocardiogram 03/16/22 IMPRESSIONS Left ventricular ejection fraction, by estimation, is 30 to 35%. Left ventricular ejection fraction by PLAX is 37 %. The left ventricle has moderate to severely decreased function. The left ventricle demonstrates global hypokinesis. The left ventricular internal cavity size was mildly dilated. Left ventricular diastolic parameters are indeterminate. Elevated left ventricular end-diastolic pressure. There is akinesis of the left ventricular, entire inferolateral wall. 1. 2. Right ventricular systolic function is normal. The right ventricular size is normal. 3. Left atrial size was moderately dilated. The mitral valve is normal in structure. Mild to moderate mitral valve regurgitation. No evidence of mitral stenosis. 4. The aortic valve appears myxomatous. There is no obvious vegetation.. The aortic valve is tricuspid. Aortic valve regurgitation is not visualized. Aortic valve sclerosis is present, with no evidence of aortic valve stenosis. 5. Comparison(s): No significant change from prior  study. 12/29/2020. AV thickness is new. Poor echo window. Recommend TEE if clinically indicated. FINDINGS   TEE Echo 03/18/22 IMPRESSIONS   1. Left ventricular ejection fraction, by estimation, is 20 to 25%. The  left ventricle has severely decreased function. The left ventricle  demonstrates global hypokinesis. The left ventricular internal cavity size  was severely dilated. Left ventricular  diastolic function could not be evaluated.   2. Right ventricular systolic function is normal. The right ventricular  size is normal.   3. Left atrial size was severely dilated. No left atrial/left atrial  appendage thrombus was detected.   4. The mitral valve is normal in structure. Mild to moderate mitral valve  regurgitation. No evidence of mitral stenosis.   5. Mild calcification of the non coronary cusp. The aortic valve is  tricuspid. There is mild calcification of the aortic valve. Aortic valve  regurgitation is trivial.     Assessment: Encounter Diagnoses  Name Primary?   Dyspnea, unspecified type Yes   Wheeze    Fever, unspecified fever cause    Stage 3b chronic kidney disease (CKD) (HCC)    S/P BKA (below knee amputation), right (HCC)    PAD (peripheral artery disease) ([place])    Hypertension associated with diabetes ([place])    Controlled type 2 diabetes mellitus with hyperglycemia, with long-term current use of insulin (HCC)    Ejection fraction < 50%      Plan: We discussed his symptoms and concerns and exam findings.  Dyspnea and wheezing in the setting of recent major surgery, recent amputation of leg, underlying diabetes high blood pressure heart disease and peripheral arterial disease.  Discussed case with supervising physician Dr. Redmond School as well since his PCP is out of the country on vacation currently.  Differential includes CHF, pneumonia, effusion of the lungs or other.  Lower suspicion for PE.  CHF is a more likely possibility.  Since his hospitalization in early May  he was changed from 20 mg of torsemide twice a day down to 10 mg once daily.  We will go ahead and have him add back 10 mg now and do torsemide 10 mg twice daily at least through the next few days until we get results back.  We discussed checking daily weights at home.  Discussed a safe way to check his weight at home.  We discussed if 3-5 pound weight gain in a given day to call us or cardiology right away.  We will possibly need to add back potassium.  I advised that he follow-up with cardiology next week.  He will call to schedule with him  He will go today for chest x-ray and we will get some stat labs.    We discussed safety at home in light of the recent amputation.  He has grab bars, walker, assisted toilet device.  We will need to add back his aspirin and Plavix soon.  We want to give a few days for the bruising and bleeding under the skin to improve from the recent pectoralis rupture.  We will likely need to add back Plavix within the next 3 to 5 days then aspirin a few days later assuming things are continuing to improve  If any change or worsening over the next few days, then call or go to the emergency department.  Melquiades was seen today for follow-up.  Diagnoses and all orders for this visit:  Dyspnea, unspecified type -     Basic metabolic panel -     CBC with Differential/Platelet -     DG Chest 2 View; Future -     Brain natriuretic peptide  Wheeze -     Basic metabolic panel -     CBC with Differential/Platelet -     DG Chest 2 View; Future -     Brain natriuretic peptide  Fever, unspecified fever cause -     Basic metabolic panel -     CBC with Differential/Platelet -     DG Chest 2 View; Future -     Brain natriuretic peptide  Stage 3b chronic kidney disease (CKD) (HCC)  S/P BKA (below knee amputation), right (HCC)  PAD (peripheral artery disease) ([place])  Hypertension associated with diabetes ([place])  Controlled type 2 diabetes mellitus with hyperglycemia, with  long-term current use of insulin (HCC)  Ejection fraction < 50%    Follow up: pending labs, chest xray

## 2022-04-17 NOTE — Patient Instructions (Signed)
Please go to [facility] for your chest xray.   Their hours are 8am - 4:30 pm Monday - Friday.  Take your insurance card with you. ° °[facility] °[phone] ° °[address], [address] °[place], [place] [address] ° °[address] °[place], [place] [address] ° ° °

## 2022-04-18 DIAGNOSIS — E1165 Type 2 diabetes mellitus with hyperglycemia: Secondary | ICD-10-CM | POA: Diagnosis not present

## 2022-04-18 LAB — BRAIN NATRIURETIC PEPTIDE: BNP: 512.8 pg/mL — ABNORMAL HIGH (ref 0.0–100.0)

## 2022-04-19 NOTE — Progress Notes (Unsigned)
   Patient presents to f/u on bruising R chest and f/u on his breathing. He was seen Friday by Audelia Acton with complaints of wheezing and low grade fever. CXR showed: IMPRESSION: Perihilar interstitial thickening with trace fluid along the fissures Findings suspicious for mild versus developing pulmonary edema.  His Torsemide was increased back to '20mg'$  daily, and he was advised to f/u with cardiology this week. Looking in chart, he has nothing scheduled until the end of August at this point. His plavix and aspirin are being held due to bruising. He did have some drop in Hgb.  Lab Results  Component Value Date   WBC 6.3 04/17/2022   HGB 11.0 (L) 04/17/2022   HCT 33.4 (L) 04/17/2022   MCV 93 04/17/2022   PLT 236 04/17/2022   BNP    Component Value Date/Time   BNP 512.8 (H) 04/17/2022 1132   BNP 1,146.7 (H) 12/28/2020 1108     Chemistry      Component Value Date/Time   NA 137 04/17/2022 1135   K 5.2 04/17/2022 1135   CL 104 04/17/2022 1135   CO2 26 04/17/2022 1135   BUN 42 (H) 04/17/2022 1135   CREATININE 1.40 (H) 04/17/2022 1135   CREATININE 1.15 09/28/2011 1358      Component Value Date/Time   CALCIUM 9.0 04/17/2022 1135   ALKPHOS 68 04/08/2022 1326   AST 21 04/08/2022 1326   ALT 17 04/08/2022 1326   BILITOT 0.6 04/08/2022 1326       PMH, PSH, SH reviewed    ROS:    PHYSICAL EXAM:  There were no vitals taken for this visit.  Wt Readings from Last 3 Encounters:  04/17/22 267 lb (121.1 kg)  04/08/22 260 lb (117.9 kg)  04/07/22 260 lb (117.9 kg)

## 2022-04-20 ENCOUNTER — Encounter: Payer: Self-pay | Admitting: Family Medicine

## 2022-04-20 ENCOUNTER — Telehealth: Payer: Self-pay | Admitting: Student

## 2022-04-20 ENCOUNTER — Ambulatory Visit (INDEPENDENT_AMBULATORY_CARE_PROVIDER_SITE_OTHER): Payer: Medicare Other | Admitting: Family Medicine

## 2022-04-20 VITALS — BP 80/50 | HR 72 | Wt 258.2 lb

## 2022-04-20 DIAGNOSIS — I739 Peripheral vascular disease, unspecified: Secondary | ICD-10-CM

## 2022-04-20 DIAGNOSIS — I5022 Chronic systolic (congestive) heart failure: Secondary | ICD-10-CM | POA: Diagnosis not present

## 2022-04-20 DIAGNOSIS — R58 Hemorrhage, not elsewhere classified: Secondary | ICD-10-CM | POA: Diagnosis not present

## 2022-04-20 DIAGNOSIS — R0789 Other chest pain: Secondary | ICD-10-CM | POA: Diagnosis not present

## 2022-04-20 NOTE — Telephone Encounter (Signed)
LVM for patient to schedule appointment for CHF, possibly today.

## 2022-04-20 NOTE — Progress Notes (Signed)
Please set him up to be seen before he ends up in ED for CHF

## 2022-04-20 NOTE — Progress Notes (Signed)
Primary Physician/Referring:  Rita Ohara, MD  Patient ID: Eduardo [name], male    DOB: 03-Aug-1956, 66 y.o.   MRN: [id number]  Chief Complaint  Patient presents with   Congestive Heart Failure   Follow-up   HPI:    Eduardo [name]  is a 66 y.o. Caucasian male  with controlled type 2 diabetes, hypertension, hyperlipidemia, coronary artery disease by angiography on 09/08/2016 revealing severe triple-vessel CAD with no targets for CABG, he presented with new onset left bundle branch block and acute fulminant pulmonary edema needing intubation on 10/11/2020, emergent cardiac catheterization essentially revealing progression of severe native vessel disease with no significant option for revascularization.  Although LAD was felt to be amenable for potential revascularization, it was unchanged from prior cardiac catheterization and RCA was felt to be the culprit, in view of diffuse disease medical management was recommended.  Past medical history is also significant for morbid obesity, diabetes mellitus with stage III AV chronic kidney disease, mild carotid atherosclerosis, right SFA occlusion S/P complex PV angiogram on 02/28/2019 with PTA to occluded right SFA with implantation of 3 overlapping 6.0 x 120 x2; 6.0 x 100 mm Eluvia DES for critical right leg ischemia now resolved. 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 He has mild disease in the left leg.  PAD has remained stable. Due to frequent PVCs, he was also started on amiodarone  prophylactically.   Patient was hospitalized 12/28/2020-01/02/2021 for acute on chronic systolic heart failure.  Patient was diuresed well, and given ischemic cardiomyopathy as etiology of heart failure attempted high risk PCI to LAD CTO, however it was unsuccessful. Conservative medical management was recommended, with consideration of palliative care involvement.  Patient was previously on spironolactone, however due to renal insufficiency this has been  discontinued.  Patient was hospitalized 03/08/2022 - 03/21/2022 with septic shock secondary to strep bacteremia from diabetic foot infection.  During hospitalization patient underwent right BKA by Dr. Sharol Given on 03/12/2022.    Patient was seen in our office 04/07/2022, however since then he was evaluated by PCP and noted weight gain and leg edema.  Patient increase torsemide to 20 mg p.o. twice daily for 3 days.  His blood pressure was soft during this time and he diuresed well with weight trending down and improvement of symptoms.  Patient was therefore advised by his PCP to reduce torsemide to 20 mg p.o. once daily.  He has had resolution of wheezing and shortness of breath.  He has upcoming appointment with nephrology for repeat labs in 2 weeks.  Past Medical History:  Diagnosis Date   CHF (congestive heart failure) (HCC)    Chronic kidney disease    stage 3   Colon polyp    Coronary artery disease    Diabetes mellitus 1987   under care of Dr. Chalmers Cater.  On insulin since 96 (off and on)   Diabetic retinopathy    Dupuytren contracture    R hand, s/p injection (Dr. Lenon Curt)   Essential hypertension, benign    Essential hypertension, benign 02/06/2019   Frequency of urination and polyuria    Hypertension    Myocardial infarction [facility])    denies   Neuromuscular disorder ([place])    Diabetic neuropathy   Osteomyelitis ([place])    right foot   Other testicular hypofunction    Peripheral arterial disease ([place]) 10/28/2012   Peritoneal abscess ([place]) 6/08   and buttock.   Pneumonia    Polydipsia    Proteinuria  Pure hyperglyceridemia    Subacute osteomyelitis, right ankle and foot ([place])    Wears glasses    Past Surgical History:  Procedure Laterality Date   ABDOMINAL AORTAGRAM N/A 04/18/2012   Procedure: ABDOMINAL Maxcine Ham;  Surgeon: Angelia Mould, MD;  Location: [facility] CATH LAB;  Service: Cardiovascular;  Laterality: N/A;   AMPUTATION Right 05/19/2019   Procedure: RIGHT FOOT 5TH RAY  AMPUTATION;  Surgeon: Newt Minion, MD;  Location: [place];  Service: Orthopedics;  Laterality: Right;   AMPUTATION Right 03/11/2022   Procedure: RIGHT LEG DEBRIDEMENT VS. BELOW KNEE AMPUTATION;  Surgeon: Newt Minion, MD;  Location: [place];  Service: Orthopedics;  Laterality: Right;   CARDIAC CATHETERIZATION N/A 09/08/2016   Procedure: Left Heart Cath and Coronary Angiography;  Surgeon: Adrian Prows, MD;  Location: [place] CV LAB;  Service: Cardiovascular;  Laterality: N/A;   CATARACT EXTRACTION, BILATERAL  09/2017, 10/2017   Dr. Herbert Deaner   COLONOSCOPY W/ BIOPSIES AND POLYPECTOMY     CORONARY/GRAFT ACUTE MI REVASCULARIZATION N/A 10/11/2020   Procedure: Coronary/Graft Acute MI Revascularization;  Surgeon: Adrian Prows, MD;  Location: [place] CV LAB;  Service: Cardiovascular;  Laterality: N/A;   I & D EXTREMITY Right 03/11/2022   Procedure: BELOW KNEE AMPUTATION;  Surgeon: Newt Minion, MD;  Location: [place];  Service: Orthopedics;  Laterality: Right;   LEFT HEART CATH N/A 12/31/2020   Procedure: Left Heart Cath;  Surgeon: Adrian Prows, MD;  Location: [place] CV LAB;  Service: Cardiovascular;  Laterality: N/A;   LEFT HEART CATH AND CORONARY ANGIOGRAPHY N/A 10/11/2020   Procedure: LEFT HEART CATH AND CORONARY ANGIOGRAPHY;  Surgeon: Adrian Prows, MD;  Location: [place] CV LAB;  Service: Cardiovascular;  Laterality: N/A;   LOWER EXTREMITY ANGIOGRAM Bilateral 04/18/2012   Procedure: LOWER EXTREMITY ANGIOGRAM;  Surgeon: Angelia Mould, MD;  Location: [facility] CATH LAB;  Service: Cardiovascular;  Laterality: Bilateral;  bilat lower extrem angio   LOWER EXTREMITY ANGIOGRAPHY Bilateral 05/02/2019   Procedure: LOWER EXTREMITY ANGIOGRAPHY;  Surgeon: Adrian Prows, MD;  Location: [place] CV LAB;  Service: Cardiovascular;  Laterality: Bilateral;   LOWER EXTREMITY ANGIOGRAPHY Bilateral 02/28/2019   Procedure: LOWER EXTREMITY ANGIOGRAPHY;  Surgeon: Adrian Prows, MD;  Location: [place] CV LAB;  Service:  Cardiovascular;  Laterality: Bilateral;   macular photocoagulation     (eye treatments for diabetic retinopathy)-Dr. Zigmund Daniel   PERIPHERAL VASCULAR INTERVENTION  02/28/2019   Procedure: PERIPHERAL VASCULAR INTERVENTION;  Surgeon: Adrian Prows, MD;  Location: [place] CV LAB;  Service: Cardiovascular;;   TEE WITHOUT CARDIOVERSION N/A 03/18/2022   Procedure: TRANSESOPHAGEAL ECHOCARDIOGRAM (TEE);  Surgeon: Adrian Prows, MD;  Location: [facility] ENDOSCOPY;  Service: Cardiovascular;  Laterality: N/A;   VENTRICULAR ASSIST DEVICE INSERTION N/A 12/31/2020   Procedure: VENTRICULAR ASSIST DEVICE INSERTION;  Surgeon: Adrian Prows, MD;  Location: [place] CV LAB;  Service: Cardiovascular;  Laterality: N/A;   Family History  Problem Relation Age of Onset   Diabetes Mother    Hearing loss Mother    Hypertension Mother    Hyperlipidemia Mother    Heart disease Mother    Varicose Veins Mother    Varicose Veins Father    Dementia Father    Hyperlipidemia Brother    Diabetes Maternal Grandmother    Social History   Tobacco Use   Smoking status: Former    Packs/day: 1.00    Years: 30.00    Total pack years: 30.00    Types: Cigarettes    Quit date: 01/08/2012  Years since quitting: 10.2   Smokeless tobacco: Never  Substance Use Topics   Alcohol use: Not Currently    Comment: rare   Marital Status: Widowed ROS  Review of Systems  Cardiovascular:  Negative for chest pain, claudication, leg swelling, near-syncope, orthopnea, palpitations, paroxysmal nocturnal dyspnea and syncope.  Respiratory:  Negative for shortness of breath.   Hematologic/Lymphatic: Bruises/bleeds easily.  Neurological:  Negative for dizziness.   Objective  Blood pressure (!) 98/41, pulse 76, temperature 98 F (36.7 C), temperature source Temporal, resp. rate 17, height 5' 10"  (1.778 m), weight 258 lb 3.2 oz (117.1 kg), SpO2 96 %.     04/21/2022   12:06 PM 04/21/2022   11:54 AM 04/20/2022    9:45 AM  Vitals with BMI  Height  5'  10"   Weight  258 lbs 3 oz 258 lbs 3 oz  BMI  78.46 96.29  Systolic 98 94 80  Diastolic 41 49 50  Pulse 76 81 72     Physical Exam Vitals reviewed.  Constitutional:      Appearance: He is well-developed.     Comments: Morbidly obese, in no acute distress  Neck:     Thyroid: No thyromegaly.     Comments: Short neck and difficult to evaluate JVP Cardiovascular:     Pulses:          Carotid pulses are 2+ on the right side and 2+ on the left side.      Popliteal pulses are 0 on the right side and 2+ on the left side.       Dorsalis pedis pulses are 1+ on the right side and 2+ on the left side.       Posterior tibial pulses are 1+ on the right side and 1+ on the left side.     Heart sounds: No murmur heard.    No gallop.  Pulmonary:     Effort: Pulmonary effort is normal.     Breath sounds: No wheezing, rhonchi or rales.  Abdominal:     Comments: Obese. Pannus present  Musculoskeletal:        General: No tenderness.     Left lower leg: Edema (trace ankle) present.     Comments: Right BKA  Skin:    Findings: Bruising (right flank, much improved) present.  Neurological:     Mental Status: He is alert.    Laboratory examination:   Recent Labs    03/21/22 0427 03/23/22 0813 03/30/22 0035 04/08/22 1326 04/17/22 1135  NA 135 136 138 142 137  K 3.9 4.1 3.9 5.6* 5.2  CL 102 106 108 107* 104  CO2 26 23 23 23 26   GLUCOSE 181* 118* 79 165* 205*  BUN 31* 31* 25* 24 42*  CREATININE 1.42* 1.36* 1.34* 1.62* 1.40*  CALCIUM 8.2* 8.4* 8.3* 9.2 9.0  GFRNONAA 54* 57* 58*  --   --    estimated creatinine clearance is 66.5 mL/min (A) (by C-G formula based on SCr of 1.4 mg/dL (H)).     Latest Ref Rng & Units 04/17/2022   11:35 AM 04/08/2022    1:26 PM 03/30/2022   12:35 AM  CMP  Glucose 70 - 99 mg/dL 205  165  79   BUN 8 - 27 mg/dL 42  24  25   Creatinine 0.76 - 1.27 mg/dL 1.40  1.62  1.34   Sodium 134 - 144 mmol/L 137  142  138   Potassium 3.5 - 5.2 mmol/L 5.2  5.6  3.9    Chloride 96 - 106 mmol/L 104  107  108   CO2 20 - 29 mmol/L 26  23  23    Calcium 8.6 - 10.2 mg/dL 9.0  9.2  8.3   Total Protein 6.0 - 8.5 g/dL  6.3    Total Bilirubin 0.0 - 1.2 mg/dL  0.6    Alkaline Phos 44 - 121 IU/L  68    AST 0 - 40 IU/L  21    ALT 0 - 44 IU/L  17        Latest Ref Rng & Units 04/17/2022   11:35 AM 04/08/2022    1:26 PM 03/30/2022   12:35 AM  CBC  WBC 3.4 - 10.8 x10E3/uL 6.3  5.7  5.5   Hemoglobin 13.0 - 17.7 g/dL 11.0  11.8  11.8   Hematocrit 37.5 - 51.0 % 33.4  35.6  35.3   Platelets 150 - 450 x10E3/uL 236  196  207    Lipid Panel     Component Value Date/Time   CHOL 96 (L) 08/18/2021 1538   TRIG 138 08/18/2021 1538   HDL 30 (L) 08/18/2021 1538   CHOLHDL 3.9 06/26/2021 0949   CHOLHDL 4.4 09/28/2011 1358   VLDL 21 09/28/2011 1358   LDLCALC 42 08/18/2021 1538    HEMOGLOBIN A1C Lab Results  Component Value Date   HGBA1C 7.3 (H) 03/11/2022   MPG 162.81 03/11/2022   TSH No results for input(s): "TSH" in the last 8760 hours.  External Labs:  Glucose Random 453.000 M 09/08/2019 BUN 38.000 M 09/08/2019 Creatinine, Serum 1.340 MG/ 09/08/2019  PSA 3.300 06/12/2019 01/19/2018: Cholesterol 115, triglycerides 112, HDL 39, LDL 54.  Glucose 125, creatinine 1.16, potassium 5.2, EGFR 67, CMP otherwise normal.  CBC normal.  TSH 1.5.  Allergies   Allergies  Allergen Reactions   Shellfish-Derived Products Nausea And Vomiting and Other (See Comments)    Only Mussels cause severe nausea and vomiting    Latex Rash   Tape Rash and Other (See Comments)    Caused issues with the skin   Testosterone Rash    Other reaction(s): did not feel well Other reaction(s): did not feel well    Medications Prior to Visit:   Outpatient Medications Prior to Visit  Medication Sig Dispense Refill   acetaminophen (TYLENOL) 325 MG tablet Take 1-2 tablets (325-650 mg total) by mouth every 4 (four) hours as needed for mild pain.     Bioflavonoid Products (ESTER C PO) Take  1,000 mg by mouth daily.     Cholecalciferol (VITAMIN D) 2000 units CAPS Take 2,000 Units by mouth daily.     ENTRESTO 24-26 MG TAKE 1 TABLET BY MOUTH  TWICE DAILY (Patient taking differently: Take 1 tablet by mouth 2 (two) times daily.) 180 tablet 1   ezetimibe (ZETIA) 10 MG tablet Take 1 tablet (10 mg total) by mouth daily. 90 tablet 3   gemfibrozil (LOPID) 600 MG tablet Take 600 mg by mouth 2 (two) times daily before a meal.     HUMALOG KWIKPEN 100 UNIT/ML KiwkPen Inject 0-10 Units into the skin See admin instructions. Inject 0-10 units into the skin three times a day, per sliding scale- based on BGL >100  1   HUMULIN N KWIKPEN 100 UNIT/ML Kiwkpen Inject 30 Units into the skin See admin instructions. Inject 35 units into the skin before breakfast  1   Krill Oil 300 MG CAPS      melatonin 10 MG  TABS Take 10 mg by mouth at bedtime.  0   Multiple Vitamin (MULTIVITAMIN WITH MINERALS) TABS tablet Take 1 tablet by mouth daily.     nitroGLYCERIN (NITROSTAT) 0.4 MG SL tablet Place 1 tablet (0.4 mg total) under the tongue every 5 (five) minutes as needed for chest pain. 15 tablet 3   oxyCODONE (OXY IR/ROXICODONE) 5 MG immediate release tablet Take 1 tablet (5 mg total) by mouth every 6 (six) hours as needed for moderate pain (pain score 4-6). 30 tablet 0   Probiotic Product (PROBIOTIC-10 PO) Take 1 capsule by mouth daily.     rosuvastatin (CRESTOR) 20 MG tablet Take 1 tablet (20 mg total) by mouth daily. 90 tablet 3   tirzepatide (MOUNJARO) 5 MG/0.5ML Pen Inject 5 mg into the skin every Friday.     torsemide (DEMADEX) 10 MG tablet Take 1 tablet (10 mg total) by mouth daily. (Patient taking differently: Take 20 mg by mouth daily.) 30 tablet 0   aspirin 81 MG chewable tablet Chew 1 tablet (81 mg total) by mouth daily. (Patient not taking: Reported on 04/21/2022)     clopidogrel (PLAVIX) 75 MG tablet TAKE 1 TABLET BY MOUTH DAILY (Patient not taking: Reported on 04/21/2022) 90 tablet 1   Melatonin 10 MG CAPS  Take 1 tablet by mouth See admin instructions.     No facility-administered medications prior to visit.   Final Medications at End of Visit    Current Meds  Medication Sig   acetaminophen (TYLENOL) 325 MG tablet Take 1-2 tablets (325-650 mg total) by mouth every 4 (four) hours as needed for mild pain.   Bioflavonoid Products (ESTER C PO) Take 1,000 mg by mouth daily.   Cholecalciferol (VITAMIN D) 2000 units CAPS Take 2,000 Units by mouth daily.   ENTRESTO 24-26 MG TAKE 1 TABLET BY MOUTH  TWICE DAILY (Patient taking differently: Take 1 tablet by mouth 2 (two) times daily.)   ezetimibe (ZETIA) 10 MG tablet Take 1 tablet (10 mg total) by mouth daily.   gemfibrozil (LOPID) 600 MG tablet Take 600 mg by mouth 2 (two) times daily before a meal.   HUMALOG KWIKPEN 100 UNIT/ML KiwkPen Inject 0-10 Units into the skin See admin instructions. Inject 0-10 units into the skin three times a day, per sliding scale- based on BGL >100   HUMULIN N KWIKPEN 100 UNIT/ML Kiwkpen Inject 30 Units into the skin See admin instructions. Inject 35 units into the skin before breakfast   Krill Oil 300 MG CAPS    melatonin 10 MG TABS Take 10 mg by mouth at bedtime.   Multiple Vitamin (MULTIVITAMIN WITH MINERALS) TABS tablet Take 1 tablet by mouth daily.   nitroGLYCERIN (NITROSTAT) 0.4 MG SL tablet Place 1 tablet (0.4 mg total) under the tongue every 5 (five) minutes as needed for chest pain.   oxyCODONE (OXY IR/ROXICODONE) 5 MG immediate release tablet Take 1 tablet (5 mg total) by mouth every 6 (six) hours as needed for moderate pain (pain score 4-6).   Probiotic Product (PROBIOTIC-10 PO) Take 1 capsule by mouth daily.   rosuvastatin (CRESTOR) 20 MG tablet Take 1 tablet (20 mg total) by mouth daily.   tirzepatide [facility]) 5 MG/0.5ML Pen Inject 5 mg into the skin every Friday.   torsemide (DEMADEX) 10 MG tablet Take 1 tablet (10 mg total) by mouth daily. (Patient taking differently: Take 20 mg by mouth daily.)    Radiology:   DG CHEST PORT 1 VIEW 10/12/2020: COMPARISON:  10/11/2020 FINDINGS: Vascular  congestion with bilateral lower lung zone airspace opacities are without change. Upper lungs are clear. Suspect small effusions.  No pneumothorax. Endotracheal tube, right internal jugular central venous line and nasal/orogastric tube are stable. IMPRESSION: 1. No change from the previous day's exam. 2. Persistent vascular congestion and lower lung zone airspace opacities, the latter finding likely due to a combination of pleural fluid with atelectasis, with a possible component of either edema or infection.   Cardiac Studies:     Exercise sestamibi stress test 08/17/2016: 1. Resting EKG demonstrates normal sinus rhythm, left axis deviation, left plantar fascicular block.  Poor R-wave progression, ulnar disease pattern.  Nonspecific ST-T abnormality, cannot exclude lateral ischemia.  Stress EKG is equivocal for ischemia, patient developing atypical left otherwise for with exercise which reverted back to baseline immediately on combination of the stress test less than 60 seconds.  There was no additional ST-T wave changes of ischemia. Patient exercised on Bruce protocol for 4:25 minutes and achieved 5.45 METS. Stress test terminated due to 89 % MPHR achieved (Target HR >85%). Symptoms included dizziness.  2.  2-Day protocol followed. Perfusion imaging studies demonstrate large sized severe perfusion defect involving the inferior, anterior and anterolateral consistent with inferior wall scar with moderate peri-infarct ischemia and severe anterior and anteroapical reversible ischemia extending from the base towards the apex.  Left ventricular systolic function calculating by QGS was markedly depressed at 26%.  This is a high risk study, consider further cardiac work-up.   Carotid artery duplex 02/16/2018: No hemodynamically significant arterial disease in the internal carotid artery bilaterally. Mild heterogenous  plaque noted.  Antegrade right vertebral artery flow. Antegrade left vertebral artery flow. Compared to the study done on 09/01/2016, left carotid noted as occlusion is an error. This may perhaps be due to low velocity noted in both carotid arteries and may indicate low systemic BP or reduced cardiac output. Clinical correlation recommended.  Peripheral arteriogram 05/02/2019: Right common femoral artery and right proximal SFA previously placed stent widely patent.  Right iliac artery shows mild disease. Left iliac artery and left femoral arteries show very mild disease.  There is two-vessel runoff in the left leg, left AT is occluded. Intermediate stenosis noted in the left distal and proximal SFA, pressure pullback reveals no significant gradient.   ABI 08/09/2019: This exam reveals normal perfusion of the right lower extremity (ABI). This exam reveals normal perfusion of the left lower extremity (ABI).  The ABI may be falsely elevated due to medial calcinosis from diabetes. No significant change since 05/11/2019. Patient has h/o right SFA stenting.   Left Heart Catheterization 10/11/20:  LV: Severely dilated.  Global hypokinesis.  Hand contrast injection hence not fully adequately visualized.  LVEF 15 to 20%.  EDP markedly elevated at 29 mmHg.  No pressure gradient across the aortic valve. Left main: Normal. LAD: Severely diffusely diseased.  Gives origin to large D1 which gives collaterals to the LAD and 2 smaller diagonals, LAD is occluded after the origin of D1, anatomy compared to prior in 2017 reveals progression of diffuse disease.  There are ipsilateral and contralateral collaterals to the LAD. CX: Moderate sized vessel, giving origin to large OM1.  OM1 is occluded in the ostium as was noted previously and has ipsilateral collaterals.  OM1 is diffusely diseased and faintly filled. RCA: Moderate disease in the proximal and mid segment.  At the bifurcation of PDA and PL, there is a  high-grade 90% stenosis.  The bifurcation is also involved with at least  a 90% stenosis in the PDA and a 60 to 70% stenosis in the PL branch.  There is no target as distally as the PL branch which is large is occluded distally and that was new from 2017.  There are faint collaterals noted from the left to the RCA.   Impression: Severe native vessel three-vessel coronary artery disease with no significant targets for revascularization, LAD may be amenable for revascularization but I am not sure that this would help, anatomy similar to 2017 but progression to diffuse disease.  Patient is extremely ill with high risk for mortality.  He also has underlying stage III-IV kidney disease and contrast nephropathy needs to be evaluated further in view of contrast load of 70 mL.  Coronary Angioplasty 12/31/20 Impella support high risk intervention  Unsuccessful attempt at proximal LAD CTO intervention in spite of use of support catheters and multiple wires. 129 ml contrast used. No immediate complications.   Continue guideline directed medical therapy for heart failure discuss regarding any future attempts of revascularization.  Lower Extremity Arterial Duplex 08/14/2021:  There is monophasic waveform pattern throughout the right lower extremity  arterial system suggesting significant proximal (Iliac artery) stenosis.  Right SFA stents appear to be widely patent.  No hemodynamically significant stenosis are identified in the left lower  extremity arterial system.  This exam reveals normal perfusion of the right lower extremity (ABI 1.00)  and mildly decreased perfusion of the left lower extremity, noted at the  post tibial artery level (ABI 0.84).  There is monophasic waveform pattern  in bilateral lower extremity at the level of the ankles. Medial calcinosis  noted in bilateral small vessels.  ABI can be falsely elevated in patients with medial calcinosis. Compared  to 04/07/2019, no change in right ABI,  left ABI was 1.00.  Echocardiogram 03/18/2022:   1. Left ventricular ejection fraction, by estimation, is 20 to 25%. The  left ventricle has severely decreased function. The left ventricle  demonstrates global hypokinesis. The left ventricular internal cavity size  was severely dilated. Left ventricular  diastolic function could not be evaluated.   2. Right ventricular systolic function is normal. The right ventricular  size is normal.   3. Left atrial size was severely dilated. No left atrial/left atrial  appendage thrombus was detected.   4. The mitral valve is normal in structure. Mild to moderate mitral valve  regurgitation. No evidence of mitral stenosis.   5. Mild calcification of the non coronary cusp. The aortic valve is  tricuspid. There is mild calcification of the aortic valve. Aortic valve  regurgitation is trivial.  Conclusion(s)/Recommendation(s): No evidence of vegetation/infective  endocarditis on this transesophageael echocardiogram.   EKG  02/17/2022: Sinus rhythm at a rate of 72 bpm.  Left axis.  Inferior infarct old.  Anterior septal infarct old.  Diffuse nonspecific T wave abnormality.  08/11/2020: Sinus tachycardia at rate of 120 bpm, atypical left bundle branch block.  No further analysis.   09/13/2020: Normal sinus rhythm at rate of 91 bpm, left atrial enlargement, inferior infarct old.  Anteroseptal infarct old.  Nonspecific T abnormality.  Compared to 08/11/2020, left bundle branch block not present.  04/19/2020: Normal sinus rhythm at rate of 61 bpm, inferior infarct old.  Anteroseptal infarct old.  Nonspecific T abnormality.  Borderline low voltage complexes.  No significant change from 11/08/2019.   Assessment     ICD-10-CM   1. Chronic systolic heart failure (HCC)  I50.22     2. Ischemic cardiomyopathy  I25.5      No orders of the defined types were placed in this encounter.  Medications Discontinued During This Encounter  Medication Reason    Melatonin 10 MG CAPS Duplicate       Recommendations:    Eduardo [name]  is a 66 y.o. Caucasian male  with controlled type 2 diabetes, hypertension, hyperlipidemia, coronary artery disease by angiography on 09/08/2016 revealing severe triple-vessel CAD with no targets for CABG, he presented with new onset left bundle branch block and acute fulminant pulmonary edema needing intubation on 10/11/2020, emergent cardiac catheterization essentially revealing progression of severe native vessel disease with no significant option for revascularization.  Although LAD was felt to be amenable for potential revascularization, it was unchanged from prior cardiac catheterization and RCA was felt to be the culprit, in view of diffuse disease medical management was recommended.  Past medical history is also significant for morbid obesity, diabetes mellitus with stage III AV chronic kidney disease, mild carotid atherosclerosis, right SFA occlusion S/P complex PV angiogram on 02/28/2019 with PTA to occluded right SFA with implantation of 3 overlapping 6.0 x 120 x2; 6.0 x 100 mm Eluvia DES for critical right leg ischemia now resolved. 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 He has mild disease in the left leg.  PAD has remained stable. Due to frequent PVCs, he was also started on amiodarone prophylactically.   Patient was again hospitalized 12/28/2020-01/02/2021 for acute on chronic systolic heart failure he diuresed well and underwent unsuccessful attempt to proximal LAD CTO intervention, was again recommended continued medical therapy.  Due to renal insufficiency while on spironolactone, this has been discontinued.   Patient was hospitalized 03/08/2022 - 03/21/2022 with septic shock secondary to strep bacteremia from diabetic foot infection.  During hospitalization patient underwent right BKA by Dr. Sharol Given on 03/12/2022.    I personally reviewed external records including PCP notes and lab results and  radiologic findings.  Patient's symptoms of wheezing and shortness of breath have essentially resolved since follow-up with PCP and weight has trended back down.  He does not appear acutely volume overloaded.  Advised patient to continue torsemide 20 mg p.o. once daily and will defer repeat BMP to upcoming nephrology appointment.  Patient is otherwise stable from a cardiovascular standpoint.  He does have soft blood pressure, however given heart failure and that he is asymptomatic we will continue to monitor closely.  Follow-up in 3 to 4 months, sooner if needed.   Alethia Berthold, PA-C 04/21/2022, 1:02 PM Office: [phone]

## 2022-04-20 NOTE — Patient Instructions (Addendum)
Try and avoid the pulling maneuvers that trigger the pectoralis pain (to try and avoid re-injuring/tearing). Compression may be helpful, if possible. Since bruising is significantly better, I think getting back on aspirin and plavix should start soon, one at a time, discuss with cardiology.  Cut back to once daily '20mg'$  Torsemide,since your swelling and breathing is better, but BP is very low. Try and do daily weights. Definitely cut back further (to '10mg'$ ) if you develop any dizziness.  No further icing is needed.  You can switch to warm compresses.

## 2022-04-21 ENCOUNTER — Encounter: Payer: Self-pay | Admitting: Student

## 2022-04-21 ENCOUNTER — Ambulatory Visit: Payer: Medicare Other | Admitting: Student

## 2022-04-21 VITALS — BP 98/41 | HR 76 | Temp 98.0°F | Resp 17 | Ht 70.0 in | Wt 258.2 lb

## 2022-04-21 DIAGNOSIS — I255 Ischemic cardiomyopathy: Secondary | ICD-10-CM | POA: Diagnosis not present

## 2022-04-21 DIAGNOSIS — I5022 Chronic systolic (congestive) heart failure: Secondary | ICD-10-CM

## 2022-04-21 NOTE — Telephone Encounter (Signed)
Pt seen in office today.

## 2022-04-21 NOTE — Progress Notes (Signed)
Tried calling patient, NA, no VM box to leave message.

## 2022-04-21 NOTE — Progress Notes (Signed)
Patient came for a consult today 04/21/22

## 2022-04-25 ENCOUNTER — Encounter: Payer: Self-pay | Admitting: Orthopedic Surgery

## 2022-04-25 NOTE — Progress Notes (Signed)
Office Visit Note   Patient: Eduardo [name]           Date of Birth: 1956/04/20           MRN: [id number] Visit Date: 04/16/2022              Requested by: Rita Ohara, MD [address] [place],  [place] [address] PCP: Rita Ohara, MD  Chief Complaint  Patient presents with   Right Leg - Routine Post Op    03/11/2022 right BKA with kerecis       HPI: Patient is a 66 year old gentleman who presents 4 weeks status post right below-knee amputation with Kerecis tissue graft for soft tissue reinforcement.  He is currently on Plavix and aspirin.  Patient states he recently sprained his pectoralis muscle on the right and has had ecchymosis and bruising secondary to his anticoagulation therapy.  Assessment & Plan: Visit Diagnoses:  1. S/P below knee amputation, right [facility])     Plan: Patient has an appointment with [facility] in a week to be casted for his prosthesis.  Follow-Up Instructions: Return in about 4 weeks (around 05/14/2022).   Ortho Exam  Patient is alert, oriented, no adenopathy, well-dressed, normal affect, normal respiratory effort. Examination patient has excellent consolidation residual limb there is no redness no cellulitis no signs of infection.  Imaging: No results found. No images are attached to the encounter.  Labs: Lab Results  Component Value Date   HGBA1C 7.3 (H) 03/11/2022   HGBA1C 6.4 12/23/2021   HGBA1C 6.5 03/19/2021   REPTSTATUS 03/17/2022 FINAL 03/12/2022   GRAMSTAIN  09/16/2021    FEW WBC PRESENT, PREDOMINANTLY MONONUCLEAR RARE GRAM POSITIVE COCCI FEW GRAM NEGATIVE RODS    CULT  03/12/2022    NO GROWTH 5 DAYS Performed at [facility], [place] [address], [place], [place] [address]    LABORGA STREPTOCOCCUS ANGINOSIS 03/08/2022     Lab Results  Component Value Date   ALBUMIN 3.6 (L) 04/08/2022   ALBUMIN 2.6 (L) 03/23/2022   ALBUMIN 2.1 (L) 03/11/2022   PREALBUMIN 6.5 (L) 03/11/2022    Lab Results  Component Value Date   MG 2.2  03/11/2022   MG 2.2 03/10/2022   MG 2.0 03/09/2022   Lab Results  Component Value Date   VD25OH 75.05 03/11/2022   VD25OH 54.2 12/05/2021    Lab Results  Component Value Date   PREALBUMIN 6.5 (L) 03/11/2022      Latest Ref Rng & Units 04/17/2022   11:35 AM 04/08/2022    1:26 PM 03/30/2022   12:35 AM  CBC EXTENDED  WBC 3.4 - 10.8 x10E3/uL 6.3  5.7  5.5   RBC 4.14 - 5.80 x10E6/uL 3.60  3.91  3.87   Hemoglobin 13.0 - 17.7 g/dL 11.0  11.8  11.8   HCT 37.5 - 51.0 % 33.4  35.6  35.3   Platelets 150 - 450 x10E3/uL 236  196  207   NEUT# 1.4 - 7.0 x10E3/uL 4.3  3.3    Lymph# 0.7 - 3.1 x10E3/uL 1.3  1.5       There is no height or weight on file to calculate BMI.  Orders:  Orders Placed This Encounter  Procedures   XR Tibia/Fibula Right   No orders of the defined types were placed in this encounter.    Procedures: No procedures performed  Clinical Data: No additional findings.  ROS:  All other systems negative, except as noted in the HPI. Review of Systems  Objective: Vital Signs: There were no vitals taken for this visit.  Specialty Comments:  No specialty comments available.  PMFS History: Patient Active Problem List   Diagnosis Date Noted   Ejection fraction < 50% 04/17/2022   Insomnia    Constipation    S/P BKA (below knee amputation), right ([place]) 03/21/2022   Ischemic cardiomyopathy    AKI (acute kidney injury) ([place]) 03/13/2022   Streptococcal bacteremia 03/13/2022   Hypophosphatemia 03/13/2022   Septic shock ([place]) 09/47/0962   Acute systolic heart failure ([place]) 12/28/2020   Acute pulmonary edema (HCC) 10/11/2020   Non-ST elevation (NSTEMI) myocardial infarction ([place]) 10/11/2020   Acute respiratory distress 10/11/2020   Acute on chronic combined systolic and diastolic CHF (congestive heart failure) ([place]) 10/11/2020   Cardiogenic shock (HCC)    Partial nontraumatic amputation of foot, right ([place]) 06/21/2019   Osteomyelitis of right foot ([place])     Ischemic ulcer of lower leg due to atherosclerosis ([place]) 02/28/2019   Essential hypertension, benign 02/06/2019   Hypercholesteremia 02/06/2019   Stage 3b chronic kidney disease (CKD) ([place]) 10/06/2017   Proteinuria 10/06/2017   Controlled type 2 diabetes mellitus with hyperglycemia, with long-term current use of insulin ([place]) 10/06/2017   Coronary artery disease involving native coronary artery of native heart without angina pectoris 05/23/2017   Abnormal nuclear stress test 09/06/2016   PAD (peripheral artery disease) ([place]) 10/28/2012   Morbid obesity with BMI of 40.0-44.9, adult ([place]) 10/13/2012   Diabetes mellitus with ophthalmic complication ([place]) 83/66/2947   Hypertension associated with diabetes ([place]) 10/13/2012   Atherosclerosis of native artery of extremity with intermittent claudication ([place]) 04/05/2012   Adenomatous colon polyp 09/28/2011   Diabetes mellitus ([place]) 09/28/2011   Pure hyperglyceridemia 09/28/2011   Erectile dysfunction 09/28/2011   Diabetic retinopathy ([place]) 09/28/2011   Past Medical History:  Diagnosis Date   CHF (congestive heart failure) ([place])    Chronic kidney disease    stage 3   Colon polyp    Coronary artery disease    Diabetes mellitus 1987   under care of Dr. Chalmers Cater.  On insulin since 96 (off and on)   Diabetic retinopathy    Dupuytren contracture    R hand, s/p injection (Dr. Lenon Curt)   Essential hypertension, benign    Essential hypertension, benign 02/06/2019   Frequency of urination and polyuria    Hypertension    Myocardial infarction [facility])    denies   Neuromuscular disorder ([place])    Diabetic neuropathy   Osteomyelitis ([place])    right foot   Other testicular hypofunction    Peripheral arterial disease ([place]) 10/28/2012   Peritoneal abscess ([place]) 6/08   and buttock.   Pneumonia    Polydipsia    Proteinuria    Pure hyperglyceridemia    Subacute osteomyelitis, right ankle and foot (HCC)    Wears glasses     Family History  Problem  Relation Age of Onset   Diabetes Mother    Hearing loss Mother    Hypertension Mother    Hyperlipidemia Mother    Heart disease Mother    Varicose Veins Mother    Varicose Veins Father    Dementia Father    Hyperlipidemia Brother    Diabetes Maternal Grandmother     Past Surgical History:  Procedure Laterality Date   ABDOMINAL AORTAGRAM N/A 04/18/2012   Procedure: ABDOMINAL Maxcine Ham;  Surgeon: Angelia Mould, MD;  Location: [facility] CATH LAB;  Service: Cardiovascular;  Laterality: N/A;   AMPUTATION Right 05/19/2019  Procedure: RIGHT FOOT 5TH RAY AMPUTATION;  Surgeon: Newt Minion, MD;  Location: [place];  Service: Orthopedics;  Laterality: Right;   AMPUTATION Right 03/11/2022   Procedure: RIGHT LEG DEBRIDEMENT VS. BELOW KNEE AMPUTATION;  Surgeon: Newt Minion, MD;  Location: [place];  Service: Orthopedics;  Laterality: Right;   CARDIAC CATHETERIZATION N/A 09/08/2016   Procedure: Left Heart Cath and Coronary Angiography;  Surgeon: Adrian Prows, MD;  Location: [place] CV LAB;  Service: Cardiovascular;  Laterality: N/A;   CATARACT EXTRACTION, BILATERAL  09/2017, 10/2017   Dr. Herbert Deaner   COLONOSCOPY W/ BIOPSIES AND POLYPECTOMY     CORONARY/GRAFT ACUTE MI REVASCULARIZATION N/A 10/11/2020   Procedure: Coronary/Graft Acute MI Revascularization;  Surgeon: Adrian Prows, MD;  Location: [place] CV LAB;  Service: Cardiovascular;  Laterality: N/A;   I & D EXTREMITY Right 03/11/2022   Procedure: BELOW KNEE AMPUTATION;  Surgeon: Newt Minion, MD;  Location: [place];  Service: Orthopedics;  Laterality: Right;   LEFT HEART CATH N/A 12/31/2020   Procedure: Left Heart Cath;  Surgeon: Adrian Prows, MD;  Location: [place] CV LAB;  Service: Cardiovascular;  Laterality: N/A;   LEFT HEART CATH AND CORONARY ANGIOGRAPHY N/A 10/11/2020   Procedure: LEFT HEART CATH AND CORONARY ANGIOGRAPHY;  Surgeon: Adrian Prows, MD;  Location: [place] CV LAB;  Service: Cardiovascular;  Laterality: N/A;   LOWER EXTREMITY  ANGIOGRAM Bilateral 04/18/2012   Procedure: LOWER EXTREMITY ANGIOGRAM;  Surgeon: Angelia Mould, MD;  Location: [facility] CATH LAB;  Service: Cardiovascular;  Laterality: Bilateral;  bilat lower extrem angio   LOWER EXTREMITY ANGIOGRAPHY Bilateral 05/02/2019   Procedure: LOWER EXTREMITY ANGIOGRAPHY;  Surgeon: Adrian Prows, MD;  Location: [place] CV LAB;  Service: Cardiovascular;  Laterality: Bilateral;   LOWER EXTREMITY ANGIOGRAPHY Bilateral 02/28/2019   Procedure: LOWER EXTREMITY ANGIOGRAPHY;  Surgeon: Adrian Prows, MD;  Location: [place] CV LAB;  Service: Cardiovascular;  Laterality: Bilateral;   macular photocoagulation     (eye treatments for diabetic retinopathy)-Dr. Zigmund Daniel   PERIPHERAL VASCULAR INTERVENTION  02/28/2019   Procedure: PERIPHERAL VASCULAR INTERVENTION;  Surgeon: Adrian Prows, MD;  Location: [place] CV LAB;  Service: Cardiovascular;;   TEE WITHOUT CARDIOVERSION N/A 03/18/2022   Procedure: TRANSESOPHAGEAL ECHOCARDIOGRAM (TEE);  Surgeon: Adrian Prows, MD;  Location: [facility] ENDOSCOPY;  Service: Cardiovascular;  Laterality: N/A;   VENTRICULAR ASSIST DEVICE INSERTION N/A 12/31/2020   Procedure: VENTRICULAR ASSIST DEVICE INSERTION;  Surgeon: Adrian Prows, MD;  Location: [place] CV LAB;  Service: Cardiovascular;  Laterality: N/A;   Social History   Occupational History   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

## 2022-04-30 DIAGNOSIS — T8743 Infection of amputation stump, right lower extremity: Secondary | ICD-10-CM | POA: Diagnosis not present

## 2022-04-30 DIAGNOSIS — Z89511 Acquired absence of right leg below knee: Secondary | ICD-10-CM | POA: Diagnosis not present

## 2022-04-30 DIAGNOSIS — I255 Ischemic cardiomyopathy: Secondary | ICD-10-CM | POA: Diagnosis not present

## 2022-05-05 ENCOUNTER — Other Ambulatory Visit: Payer: Self-pay | Admitting: Orthopedic Surgery

## 2022-05-05 MED ORDER — OXYCODONE HCL 5 MG PO TABS: 5.0000 mg | ORAL_TABLET | Freq: Four times a day (QID) | ORAL | 0 refills | Status: DC | PRN

## 2022-05-14 ENCOUNTER — Ambulatory Visit (INDEPENDENT_AMBULATORY_CARE_PROVIDER_SITE_OTHER): Payer: Medicare Other | Admitting: Orthopedic Surgery

## 2022-05-14 DIAGNOSIS — Z89511 Acquired absence of right leg below knee: Secondary | ICD-10-CM

## 2022-05-15 ENCOUNTER — Encounter: Payer: Self-pay | Admitting: Orthopedic Surgery

## 2022-05-15 DIAGNOSIS — Z89511 Acquired absence of right leg below knee: Secondary | ICD-10-CM

## 2022-05-18 ENCOUNTER — Encounter: Payer: Self-pay | Admitting: Physical Medicine and Rehabilitation

## 2022-05-18 ENCOUNTER — Encounter
Payer: Medicare Other | Attending: Physical Medicine and Rehabilitation | Admitting: Physical Medicine and Rehabilitation

## 2022-05-18 VITALS — BP 110/70 | HR 76 | Ht 70.0 in

## 2022-05-18 DIAGNOSIS — Z794 Long term (current) use of insulin: Secondary | ICD-10-CM | POA: Diagnosis not present

## 2022-05-18 DIAGNOSIS — E1165 Type 2 diabetes mellitus with hyperglycemia: Secondary | ICD-10-CM | POA: Diagnosis not present

## 2022-05-18 DIAGNOSIS — Z89511 Acquired absence of right leg below knee: Secondary | ICD-10-CM | POA: Diagnosis not present

## 2022-05-18 DIAGNOSIS — I50812 Chronic right heart failure: Secondary | ICD-10-CM | POA: Insufficient documentation

## 2022-05-18 NOTE — Patient Instructions (Addendum)
Pt is a 66 yr old male with recent R BKA in May 2023; , also has hx of dCHF, DM; on AC, PAD; AC held due ot pec tear;  Here for hospital f/u on R BKA.   Getting prosthesis on 06/02/22- getting outpatient PT gait training at Dr Jess Barters office.   2. Call me if needs outpatient PT written for- if doesn't hear  by 7/20, call me and I can order.    3. No phantom or residual limb pain.    4. Suggest doing some Upper body strengthening  exercises at least 3-4 days/week until and even after starts PT.    5. F/U in 4 months.   6. Have to keep fluid restrictions/intake under control. So can continue to wear prosthesis.

## 2022-05-18 NOTE — Progress Notes (Signed)
Subjective:    Patient ID: Eduardo [name], male    DOB: 06-02-1956, 66 y.o.   MRN: [id number]  HPI  Pt is a 66 yr old male with recent R BKA in May 2023; , also has hx of dCHF, DM; on AC, PAD; AC held due ot pec tear;  Here for hospital f/u on R BKA.   Going to get R BKA prosthesis on 7/25  Was walking last Friday with a temporary leg.   Has lost 30 lbs and now off insulin- since saw in hospital.   CBG's running around 125- always under 200- and no low CBGs events. Last time 1 month ago.   Takes tylenol occ, but not related for R BKA- so no pain.   Pain Inventory Average Pain 0 Pain Right Now 0 My pain is  no pain  In the last 24 hours, has pain interfered with the following? General activity 0 Relation with others 0 Enjoyment of life 0 What TIME of day is your pain at its worst? varies Sleep (in general) Good  Pain is worse with:  no pain Pain improves with:  no pain Relief from Meds:  on no meds  use a walker ability to climb steps?  no do you drive?  no use a wheelchair transfers alone  retired  No problems in this area  Any changes since last visit?  no  Any changes since last visit?  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 Retired. No pets. Enjoys fishing.   Updated 06/2021   Social Determinants of Health   Financial Resource Strain: Not on file  Food Insecurity: Not on file  Transportation Needs: Not on file  Physical Activity: Not on file  Stress: Not on file  Social Connections: Not on file   Past Surgical History:  Procedure Laterality Date   ABDOMINAL AORTAGRAM N/A 04/18/2012   Procedure: ABDOMINAL Maxcine Ham;  Surgeon: Angelia Mould, MD;  Location: [facility] CATH LAB;  Service: Cardiovascular;  Laterality: N/A;   AMPUTATION Right 05/19/2019   Procedure: RIGHT FOOT 5TH RAY AMPUTATION;  Surgeon: Newt Minion, MD;  Location:  [place] OR;  Service: Orthopedics;  Laterality: Right;   AMPUTATION Right 03/11/2022   Procedure: RIGHT LEG DEBRIDEMENT VS. BELOW KNEE AMPUTATION;  Surgeon: Newt Minion, MD;  Location: [place];  Service: Orthopedics;  Laterality: Right;   CARDIAC CATHETERIZATION N/A 09/08/2016   Procedure: Left Heart Cath and Coronary Angiography;  Surgeon: Adrian Prows, MD;  Location: [place] CV LAB;  Service: Cardiovascular;  Laterality: N/A;   CATARACT EXTRACTION, BILATERAL  09/2017, 10/2017   Dr. Herbert Deaner   COLONOSCOPY W/ BIOPSIES AND POLYPECTOMY     CORONARY/GRAFT ACUTE MI REVASCULARIZATION N/A 10/11/2020   Procedure: Coronary/Graft Acute MI Revascularization;  Surgeon: Adrian Prows, MD;  Location: [place] CV LAB;  Service: Cardiovascular;  Laterality: N/A;   I & D EXTREMITY Right 03/11/2022   Procedure: BELOW KNEE AMPUTATION;  Surgeon: Newt Minion, MD;  Location: [place];  Service: Orthopedics;  Laterality: Right;   LEFT HEART CATH N/A 12/31/2020   Procedure:  Left Heart Cath;  Surgeon: Adrian Prows, MD;  Location: [place] CV LAB;  Service: Cardiovascular;  Laterality: N/A;   LEFT HEART CATH AND CORONARY ANGIOGRAPHY N/A 10/11/2020   Procedure: LEFT HEART CATH AND CORONARY ANGIOGRAPHY;  Surgeon: Adrian Prows, MD;  Location: [place] CV LAB;  Service: Cardiovascular;  Laterality: N/A;   LOWER EXTREMITY ANGIOGRAM Bilateral 04/18/2012   Procedure: LOWER EXTREMITY ANGIOGRAM;  Surgeon: Angelia Mould, MD;  Location: [facility] CATH LAB;  Service: Cardiovascular;  Laterality: Bilateral;  bilat lower extrem angio   LOWER EXTREMITY ANGIOGRAPHY Bilateral 05/02/2019   Procedure: LOWER EXTREMITY ANGIOGRAPHY;  Surgeon: Adrian Prows, MD;  Location: [place] CV LAB;  Service: Cardiovascular;  Laterality: Bilateral;   LOWER EXTREMITY ANGIOGRAPHY Bilateral 02/28/2019   Procedure: LOWER EXTREMITY ANGIOGRAPHY;  Surgeon: Adrian Prows, MD;  Location: [place] CV LAB;  Service: Cardiovascular;  Laterality: Bilateral;   macular photocoagulation     (eye treatments for diabetic retinopathy)-Dr. Zigmund Daniel   PERIPHERAL VASCULAR INTERVENTION  02/28/2019   Procedure: PERIPHERAL VASCULAR INTERVENTION;  Surgeon: Adrian Prows, MD;  Location: [place] CV LAB;  Service: Cardiovascular;;   TEE WITHOUT CARDIOVERSION N/A 03/18/2022   Procedure: TRANSESOPHAGEAL ECHOCARDIOGRAM (TEE);  Surgeon: Adrian Prows, MD;  Location: [facility] ENDOSCOPY;  Service: Cardiovascular;  Laterality: N/A;   VENTRICULAR ASSIST DEVICE INSERTION N/A 12/31/2020   Procedure: VENTRICULAR ASSIST DEVICE INSERTION;  Surgeon: Adrian Prows, MD;  Location: [place] CV LAB;  Service: Cardiovascular;  Laterality: N/A;   Past Medical History:  Diagnosis Date   CHF (congestive heart failure) ([place])    Chronic kidney disease    stage 3   Colon polyp    Coronary artery disease    Diabetes mellitus 1987   under care of Dr. Chalmers Cater.  On insulin since 96 (off and on)   Diabetic retinopathy    Dupuytren contracture    R hand, s/p  injection (Dr. Lenon Curt)   Essential hypertension, benign    Essential hypertension, benign 02/06/2019   Frequency of urination and polyuria    Hypertension    Myocardial infarction [facility])    denies   Neuromuscular disorder ([place])    Diabetic neuropathy   Osteomyelitis ([place])    right foot   Other testicular hypofunction    Peripheral arterial disease ([place]) 10/28/2012   Peritoneal abscess ([place]) 6/08   and buttock.   Pneumonia    Polydipsia    Proteinuria    Pure hyperglyceridemia    Subacute osteomyelitis, right ankle and foot (HCC)    Wears glasses  BP 110/70   Pulse 76   Ht '5\' 10"'$  (1.778 m)   SpO2 95%   BMI 37.05 kg/m   Opioid Risk Score:   Fall Risk Score:  `1  Depression screen [facility] 2/9     05/18/2022   11:28 AM 06/26/2021    8:45 AM 06/24/2020    8:40 AM 06/12/2019    8:52 AM 06/06/2018    8:47 AM 05/25/2017   10:03 AM  Depression screen PHQ 2/9  Decreased Interest 0 0 0 0 0 0  Down, Depressed, Hopeless 0 0 0 0 0 0  PHQ - 2 Score 0 0 0 0 0 0  Altered sleeping 0       Tired, decreased energy 0       Change in appetite 0       Feeling bad or failure about yourself  0       Trouble concentrating 0       Moving slowly or fidgety/restless 0       Suicidal thoughts 0       PHQ-9 Score 0          Review of Systems  Constitutional: Negative.   HENT: Negative.    Eyes: Negative.   Respiratory: Negative.    Cardiovascular: Negative.   Gastrointestinal: Negative.   Endocrine: Negative.   Genitourinary: Negative.   Musculoskeletal:  Positive for gait problem.  Skin: Negative.   Allergic/Immunologic: Negative.   Hematological: Negative.   Psychiatric/Behavioral: Negative.        Objective:   Physical Exam  Awake, alert, in Level IV w/c- wearing limb guard and shrinker, NAD Has a tiny scab medially that is peeling off- granulating skin underneath- and very localized erythema around that scab, otherwise, 2 tiny areas with localized erythema but doesn'st look  infected Great shaping- not bulbous  MS: HF 5/5 ; KE/KF 5/5 with R BKA No knee and hip contractures on exam      Assessment & Plan:   Pt is a 66 yr old male with recent R BKA in May 2023; , also has hx of dCHF, DM; on AC, PAD; AC held due ot pec tear;  Here for hospital f/u on R BKA.   Getting prosthesis on 06/02/22- getting outpatient PT gait training at Dr Jess Barters office.   2. Call me if needs outpatient PT written for- if doesn't hear  by 7/20, call me and I can order.    3. No phantom or residual limb pain. No interventions required.    4. Suggest doing some Upper body strengthening  exercises at least 3-4 days/week until and even after starts PT.    5. F/U in 4 months.   6. Have to keep fluid restrictions/intake under control. So can continue to wear prosthesis.    I spent a total of 21   minutes on total care today- >50% coordination of care- due to  education on fluid levels, prosthesis; and prognosis.

## 2022-05-20 DIAGNOSIS — Z89511 Acquired absence of right leg below knee: Secondary | ICD-10-CM | POA: Diagnosis not present

## 2022-05-20 DIAGNOSIS — I129 Hypertensive chronic kidney disease with stage 1 through stage 4 chronic kidney disease, or unspecified chronic kidney disease: Secondary | ICD-10-CM | POA: Diagnosis not present

## 2022-05-20 DIAGNOSIS — E1122 Type 2 diabetes mellitus with diabetic chronic kidney disease: Secondary | ICD-10-CM | POA: Diagnosis not present

## 2022-05-20 DIAGNOSIS — I5022 Chronic systolic (congestive) heart failure: Secondary | ICD-10-CM | POA: Diagnosis not present

## 2022-05-20 DIAGNOSIS — I2510 Atherosclerotic heart disease of native coronary artery without angina pectoris: Secondary | ICD-10-CM | POA: Diagnosis not present

## 2022-05-20 DIAGNOSIS — N1831 Chronic kidney disease, stage 3a: Secondary | ICD-10-CM | POA: Diagnosis not present

## 2022-05-29 NOTE — Progress Notes (Signed)
External labs 05/27/2022: BUN 52, creatinine 1.39, GFR 56, sodium 136, potassium 4.4

## 2022-05-30 DIAGNOSIS — T8743 Infection of amputation stump, right lower extremity: Secondary | ICD-10-CM | POA: Diagnosis not present

## 2022-05-30 DIAGNOSIS — I255 Ischemic cardiomyopathy: Secondary | ICD-10-CM | POA: Diagnosis not present

## 2022-05-30 DIAGNOSIS — Z89511 Acquired absence of right leg below knee: Secondary | ICD-10-CM | POA: Diagnosis not present

## 2022-06-02 ENCOUNTER — Encounter: Payer: Self-pay | Admitting: Family Medicine

## 2022-06-03 ENCOUNTER — Encounter: Payer: Medicare Other | Admitting: Physical Therapy

## 2022-06-05 ENCOUNTER — Encounter: Payer: Self-pay | Admitting: Orthopedic Surgery

## 2022-06-05 NOTE — Progress Notes (Signed)
Office Visit Note   Patient: Eduardo [name]           Date of Birth: October 25, 1956           MRN: [id number] Visit Date: 05/14/2022              Requested by: Rita Ohara, MD [address] [place],  [place] [address] PCP: Rita Ohara, MD  No chief complaint on file.     HPI: Patient is a 66 year old gentleman who is 2 months status post right transtibial amputation with Kerecis tissue graft.  Patient has his temporary prosthesis.  Assessment & Plan: Visit Diagnoses:  1. S/P below knee amputation, right ([place])     Plan: Physical therapy upstairs.  2 view radiographs of the right leg at follow-up.  Follow-Up Instructions: Return in about 2 months (around 07/15/2022).   Ortho Exam  Patient is alert, oriented, no adenopathy, well-dressed, normal affect, normal respiratory effort. Examination the wound is well consolidated and healed.  Imaging: No results found.    Labs: Lab Results  Component Value Date   HGBA1C 7.3 (H) 03/11/2022   HGBA1C 6.4 12/23/2021   HGBA1C 6.5 03/19/2021   REPTSTATUS 03/17/2022 FINAL 03/12/2022   GRAMSTAIN  09/16/2021    FEW WBC PRESENT, PREDOMINANTLY MONONUCLEAR RARE GRAM POSITIVE COCCI FEW GRAM NEGATIVE RODS    CULT  03/12/2022    NO GROWTH 5 DAYS Performed at [facility], [place] [address], [place], [place] [address]    LABORGA STREPTOCOCCUS ANGINOSIS 03/08/2022     Lab Results  Component Value Date   ALBUMIN 3.6 (L) 04/08/2022   ALBUMIN 2.6 (L) 03/23/2022   ALBUMIN 2.1 (L) 03/11/2022   PREALBUMIN 6.5 (L) 03/11/2022    Lab Results  Component Value Date   MG 2.2 03/11/2022   MG 2.2 03/10/2022   MG 2.0 03/09/2022   Lab Results  Component Value Date   VD25OH 75.05 03/11/2022   VD25OH 54.2 12/05/2021    Lab Results  Component Value Date   PREALBUMIN 6.5 (L) 03/11/2022      Latest Ref Rng & Units 04/17/2022   11:35 AM 04/08/2022    1:26 PM 03/30/2022   12:35 AM  CBC EXTENDED  WBC 3.4 - 10.8 x10E3/uL 6.3  5.7   5.5   RBC 4.14 - 5.80 x10E6/uL 3.60  3.91  3.87   Hemoglobin 13.0 - 17.7 g/dL 11.0  11.8  11.8   HCT 37.5 - 51.0 % 33.4  35.6  35.3   Platelets 150 - 450 x10E3/uL 236  196  207   NEUT# 1.4 - 7.0 x10E3/uL 4.3  3.3    Lymph# 0.7 - 3.1 x10E3/uL 1.3  1.5       There is no height or weight on file to calculate BMI.  Orders:  No orders of the defined types were placed in this encounter.  No orders of the defined types were placed in this encounter.    Procedures: No procedures performed  Clinical Data: No additional findings.  ROS:  All other systems negative, except as noted in the HPI. Review of Systems  Objective: Vital Signs: There were no vitals taken for this visit.  Specialty Comments:  No specialty comments available.  PMFS History: Patient Active Problem List   Diagnosis Date Noted   Chronic right-sided congestive heart failure ([place]) 05/18/2022   Ejection fraction < 50% 04/17/2022   Insomnia    Constipation    S/P BKA (below knee amputation), right ([place]) 03/21/2022  Ischemic cardiomyopathy    AKI (acute kidney injury) ([place]) 03/13/2022   Streptococcal bacteremia 03/13/2022   Hypophosphatemia 03/13/2022   Septic shock ([place]) 47/42/5956   Acute systolic heart failure ([place]) 12/28/2020   Acute pulmonary edema (HCC) 10/11/2020   Non-ST elevation (NSTEMI) myocardial infarction ([place]) 10/11/2020   Acute respiratory distress 10/11/2020   Acute on chronic combined systolic and diastolic CHF (congestive heart failure) ([place]) 10/11/2020   Cardiogenic shock (HCC)    Partial nontraumatic amputation of foot, right ([place]) 06/21/2019   Osteomyelitis of right foot ([place])    Ischemic ulcer of lower leg due to atherosclerosis ([place]) 02/28/2019   Essential hypertension, benign 02/06/2019   Hypercholesteremia 02/06/2019   Stage 3b chronic kidney disease (CKD) ([place]) 10/06/2017   Proteinuria 10/06/2017   Controlled type 2 diabetes mellitus with hyperglycemia, with long-term current  use of insulin ([place]) 10/06/2017   Coronary artery disease involving native coronary artery of native heart without angina pectoris 05/23/2017   Abnormal nuclear stress test 09/06/2016   PAD (peripheral artery disease) ([place]) 10/28/2012   Morbid obesity with BMI of 40.0-44.9, adult ([place]) 10/13/2012   Diabetes mellitus with ophthalmic complication ([place]) 38/75/6433   Hypertension associated with diabetes ([place]) 10/13/2012   Atherosclerosis of native artery of extremity with intermittent claudication ([place]) 04/05/2012   Adenomatous colon polyp 09/28/2011   Diabetes mellitus ([place]) 09/28/2011   Pure hyperglyceridemia 09/28/2011   Erectile dysfunction 09/28/2011   Diabetic retinopathy ([place]) 09/28/2011   Past Medical History:  Diagnosis Date   CHF (congestive heart failure) ([place])    Chronic kidney disease    stage 3   Colon polyp    Coronary artery disease    Diabetes mellitus 1987   under care of Dr. Chalmers Cater.  On insulin since 96 (off and on)   Diabetic retinopathy    Dupuytren contracture    R hand, s/p injection (Dr. Lenon Curt)   Essential hypertension, benign    Essential hypertension, benign 02/06/2019   Frequency of urination and polyuria    Hypertension    Myocardial infarction [facility])    denies   Neuromuscular disorder ([place])    Diabetic neuropathy   Osteomyelitis ([place])    right foot   Other testicular hypofunction    Peripheral arterial disease ([place]) 10/28/2012   Peritoneal abscess ([place]) 6/08   and buttock.   Pneumonia    Polydipsia    Proteinuria    Pure hyperglyceridemia    Subacute osteomyelitis, right ankle and foot (HCC)    Wears glasses     Family History  Problem Relation Age of Onset   Diabetes Mother    Hearing loss Mother    Hypertension Mother    Hyperlipidemia Mother    Heart disease Mother    Varicose Veins Mother    Varicose Veins Father    Dementia Father    Hyperlipidemia Brother    Diabetes Maternal Grandmother     Past Surgical History:  Procedure  Laterality Date   ABDOMINAL AORTAGRAM N/A 04/18/2012   Procedure: ABDOMINAL Maxcine Ham;  Surgeon: Angelia Mould, MD;  Location: [facility] CATH LAB;  Service: Cardiovascular;  Laterality: N/A;   AMPUTATION Right 05/19/2019   Procedure: RIGHT FOOT 5TH RAY AMPUTATION;  Surgeon: Newt Minion, MD;  Location: [place];  Service: Orthopedics;  Laterality: Right;   AMPUTATION Right 03/11/2022   Procedure: RIGHT LEG DEBRIDEMENT VS. BELOW KNEE AMPUTATION;  Surgeon: Newt Minion, MD;  Location: [place];  Service: Orthopedics;  Laterality: Right;   CARDIAC CATHETERIZATION  N/A 09/08/2016   Procedure: Left Heart Cath and Coronary Angiography;  Surgeon: Adrian Prows, MD;  Location: [place] CV LAB;  Service: Cardiovascular;  Laterality: N/A;   CATARACT EXTRACTION, BILATERAL  09/2017, 10/2017   Dr. Herbert Deaner   COLONOSCOPY W/ BIOPSIES AND POLYPECTOMY     CORONARY/GRAFT ACUTE MI REVASCULARIZATION N/A 10/11/2020   Procedure: Coronary/Graft Acute MI Revascularization;  Surgeon: Adrian Prows, MD;  Location: [place] CV LAB;  Service: Cardiovascular;  Laterality: N/A;   I & D EXTREMITY Right 03/11/2022   Procedure: BELOW KNEE AMPUTATION;  Surgeon: Newt Minion, MD;  Location: [place];  Service: Orthopedics;  Laterality: Right;   LEFT HEART CATH N/A 12/31/2020   Procedure: Left Heart Cath;  Surgeon: Adrian Prows, MD;  Location: [place] CV LAB;  Service: Cardiovascular;  Laterality: N/A;   LEFT HEART CATH AND CORONARY ANGIOGRAPHY N/A 10/11/2020   Procedure: LEFT HEART CATH AND CORONARY ANGIOGRAPHY;  Surgeon: Adrian Prows, MD;  Location: [place] CV LAB;  Service: Cardiovascular;  Laterality: N/A;   LOWER EXTREMITY ANGIOGRAM Bilateral 04/18/2012   Procedure: LOWER EXTREMITY ANGIOGRAM;  Surgeon: Angelia Mould, MD;  Location: [facility] CATH LAB;  Service: Cardiovascular;  Laterality: Bilateral;  bilat lower extrem angio   LOWER EXTREMITY ANGIOGRAPHY Bilateral 05/02/2019   Procedure: LOWER EXTREMITY ANGIOGRAPHY;  Surgeon: Adrian Prows, MD;  Location: [place] CV LAB;  Service: Cardiovascular;  Laterality: Bilateral;   LOWER EXTREMITY ANGIOGRAPHY Bilateral 02/28/2019   Procedure: LOWER EXTREMITY ANGIOGRAPHY;  Surgeon: Adrian Prows, MD;  Location: [place] [facility] LAB;  Service: Cardiovascular;  Laterality: Bilateral;   macular photocoagulation     (eye treatments for diabetic retinopathy)-Dr. Zigmund Daniel   PERIPHERAL VASCULAR INTERVENTION  02/28/2019   Procedure: PERIPHERAL VASCULAR INTERVENTION;  Surgeon: Adrian Prows, MD;  Location: [place] CV LAB;  Service: Cardiovascular;;   TEE WITHOUT CARDIOVERSION N/A 03/18/2022   Procedure: TRANSESOPHAGEAL ECHOCARDIOGRAM (TEE);  Surgeon: Adrian Prows, MD;  Location: [facility] ENDOSCOPY;  Service: Cardiovascular;  Laterality: N/A;   VENTRICULAR ASSIST DEVICE INSERTION N/A 12/31/2020   Procedure: VENTRICULAR ASSIST DEVICE INSERTION;  Surgeon: Adrian Prows, MD;  Location: [place] CV LAB;  Service: Cardiovascular;  Laterality: N/A;   Social History   Occupational History   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

## 2022-06-17 ENCOUNTER — Encounter: Payer: Medicare Other | Admitting: Physical Therapy

## 2022-06-18 DIAGNOSIS — E1165 Type 2 diabetes mellitus with hyperglycemia: Secondary | ICD-10-CM | POA: Diagnosis not present

## 2022-06-23 DIAGNOSIS — I10 Essential (primary) hypertension: Secondary | ICD-10-CM | POA: Diagnosis not present

## 2022-06-23 DIAGNOSIS — N189 Chronic kidney disease, unspecified: Secondary | ICD-10-CM | POA: Diagnosis not present

## 2022-06-23 DIAGNOSIS — E1165 Type 2 diabetes mellitus with hyperglycemia: Secondary | ICD-10-CM | POA: Diagnosis not present

## 2022-06-23 DIAGNOSIS — I739 Peripheral vascular disease, unspecified: Secondary | ICD-10-CM | POA: Diagnosis not present

## 2022-06-23 DIAGNOSIS — S88911A Complete traumatic amputation of right lower leg, level unspecified, initial encounter: Secondary | ICD-10-CM | POA: Diagnosis not present

## 2022-06-23 DIAGNOSIS — I2510 Atherosclerotic heart disease of native coronary artery without angina pectoris: Secondary | ICD-10-CM | POA: Diagnosis not present

## 2022-06-23 DIAGNOSIS — Z89421 Acquired absence of other right toe(s): Secondary | ICD-10-CM | POA: Diagnosis not present

## 2022-06-23 DIAGNOSIS — R809 Proteinuria, unspecified: Secondary | ICD-10-CM | POA: Diagnosis not present

## 2022-06-23 DIAGNOSIS — E7800 Pure hypercholesterolemia, unspecified: Secondary | ICD-10-CM | POA: Diagnosis not present

## 2022-06-23 DIAGNOSIS — L97509 Non-pressure chronic ulcer of other part of unspecified foot with unspecified severity: Secondary | ICD-10-CM | POA: Diagnosis not present

## 2022-06-23 LAB — HEMOGLOBIN A1C: Hemoglobin A1C: 6.6

## 2022-06-24 ENCOUNTER — Encounter: Payer: Self-pay | Admitting: *Deleted

## 2022-06-25 DIAGNOSIS — Z89511 Acquired absence of right leg below knee: Secondary | ICD-10-CM | POA: Diagnosis not present

## 2022-06-30 ENCOUNTER — Other Ambulatory Visit: Payer: Self-pay

## 2022-06-30 ENCOUNTER — Ambulatory Visit: Payer: Medicare Other | Admitting: Physical Therapy

## 2022-06-30 ENCOUNTER — Encounter: Payer: Self-pay | Admitting: Physical Therapy

## 2022-06-30 DIAGNOSIS — R2689 Other abnormalities of gait and mobility: Secondary | ICD-10-CM | POA: Diagnosis not present

## 2022-06-30 DIAGNOSIS — M6281 Muscle weakness (generalized): Secondary | ICD-10-CM

## 2022-06-30 DIAGNOSIS — R293 Abnormal posture: Secondary | ICD-10-CM | POA: Diagnosis not present

## 2022-06-30 DIAGNOSIS — R2681 Unsteadiness on feet: Secondary | ICD-10-CM | POA: Diagnosis not present

## 2022-06-30 DIAGNOSIS — T8743 Infection of amputation stump, right lower extremity: Secondary | ICD-10-CM | POA: Diagnosis not present

## 2022-06-30 DIAGNOSIS — M79661 Pain in right lower leg: Secondary | ICD-10-CM | POA: Diagnosis not present

## 2022-06-30 DIAGNOSIS — Z89511 Acquired absence of right leg below knee: Secondary | ICD-10-CM | POA: Diagnosis not present

## 2022-06-30 DIAGNOSIS — I255 Ischemic cardiomyopathy: Secondary | ICD-10-CM | POA: Diagnosis not present

## 2022-06-30 NOTE — Therapy (Signed)
OUTPATIENT PHYSICAL THERAPY PROSTHETICS EVALUATION  Patient Name: Eduardo [name] MRN: [id number] DOB:Jun 27, 1956, 66 y.o., male Today's Date: 06/30/2022  PCP: Rita Ohara, MD REFERRING PROVIDER: Newt Minion, MD   PT End of Session - 06/30/22 1430     Visit Number 1    Number of Visits 26    Date for PT Re-Evaluation 09/24/22    Authorization Type [employer] Medicare    Authorization Time Period $20 copay    Progress Note Due on Visit 10    PT Start Time 1345    PT Stop Time 1430    PT Time Calculation (min) 45 min    Equipment Utilized During Treatment Gait belt    Activity Tolerance Patient tolerated treatment well;Patient limited by pain    Behavior During Therapy WFL for tasks assessed/performed             Past Medical History:  Diagnosis Date   CHF (congestive heart failure) ([place])    Chronic kidney disease    stage 3   Colon polyp    Coronary artery disease    Diabetes mellitus 1987   under care of Dr. Chalmers Cater.  On insulin since 96 (off and on)   Diabetic retinopathy    Dupuytren contracture    R hand, s/p injection (Dr. Lenon Curt)   Essential hypertension, benign    Essential hypertension, benign 02/06/2019   Frequency of urination and polyuria    Hypertension    Myocardial infarction [facility])    denies   Neuromuscular disorder ([place])    Diabetic neuropathy   Osteomyelitis ([place])    right foot   Other testicular hypofunction    Peripheral arterial disease ([place]) 10/28/2012   Peritoneal abscess ([place]) 6/08   and buttock.   Pneumonia    Polydipsia    Proteinuria    Pure hyperglyceridemia    Subacute osteomyelitis, right ankle and foot ([place])    Wears glasses    Past Surgical History:  Procedure Laterality Date   ABDOMINAL AORTAGRAM N/A 04/18/2012   Procedure: ABDOMINAL Maxcine Ham;  Surgeon: Angelia Mould, MD;  Location: [facility] CATH LAB;  Service: Cardiovascular;  Laterality: N/A;   AMPUTATION Right 05/19/2019   Procedure: RIGHT FOOT 5TH RAY AMPUTATION;  Surgeon:  Newt Minion, MD;  Location: [place];  Service: Orthopedics;  Laterality: Right;   AMPUTATION Right 03/11/2022   Procedure: RIGHT LEG DEBRIDEMENT VS. BELOW KNEE AMPUTATION;  Surgeon: Newt Minion, MD;  Location: [place];  Service: Orthopedics;  Laterality: Right;   CARDIAC CATHETERIZATION N/A 09/08/2016   Procedure: Left Heart Cath and Coronary Angiography;  Surgeon: Adrian Prows, MD;  Location: [place] CV LAB;  Service: Cardiovascular;  Laterality: N/A;   CATARACT EXTRACTION, BILATERAL  09/2017, 10/2017   Dr. Herbert Deaner   COLONOSCOPY W/ BIOPSIES AND POLYPECTOMY     CORONARY/GRAFT ACUTE MI REVASCULARIZATION N/A 10/11/2020   Procedure: Coronary/Graft Acute MI Revascularization;  Surgeon: Adrian Prows, MD;  Location: [place] CV LAB;  Service: Cardiovascular;  Laterality: N/A;   I & D EXTREMITY Right 03/11/2022   Procedure: BELOW KNEE AMPUTATION;  Surgeon: Newt Minion, MD;  Location: [place];  Service: Orthopedics;  Laterality: Right;   LEFT HEART CATH N/A 12/31/2020   Procedure: Left Heart Cath;  Surgeon: Adrian Prows, MD;  Location: [place] CV LAB;  Service: Cardiovascular;  Laterality: N/A;   LEFT HEART CATH AND CORONARY ANGIOGRAPHY N/A 10/11/2020   Procedure: LEFT HEART CATH AND CORONARY ANGIOGRAPHY;  Surgeon: Adrian Prows, MD;  Location: [place] CV LAB;  Service: Cardiovascular;  Laterality: N/A;   LOWER EXTREMITY ANGIOGRAM Bilateral 04/18/2012   Procedure: LOWER EXTREMITY ANGIOGRAM;  Surgeon: Angelia Mould, MD;  Location: [facility] CATH LAB;  Service: Cardiovascular;  Laterality: Bilateral;  bilat lower extrem angio   LOWER EXTREMITY ANGIOGRAPHY Bilateral 05/02/2019   Procedure: LOWER EXTREMITY ANGIOGRAPHY;  Surgeon: Adrian Prows, MD;  Location: [place] CV LAB;  Service: Cardiovascular;  Laterality: Bilateral;   LOWER EXTREMITY ANGIOGRAPHY Bilateral 02/28/2019   Procedure: LOWER EXTREMITY ANGIOGRAPHY;  Surgeon: Adrian Prows, MD;  Location: [place] CV LAB;  Service: Cardiovascular;  Laterality:  Bilateral;   macular photocoagulation     (eye treatments for diabetic retinopathy)-Dr. Zigmund Daniel   PERIPHERAL VASCULAR INTERVENTION  02/28/2019   Procedure: PERIPHERAL VASCULAR INTERVENTION;  Surgeon: Adrian Prows, MD;  Location: [place] CV LAB;  Service: Cardiovascular;;   TEE WITHOUT CARDIOVERSION N/A 03/18/2022   Procedure: TRANSESOPHAGEAL ECHOCARDIOGRAM (TEE);  Surgeon: Adrian Prows, MD;  Location[facility] ENDOSCOPY;  Service: Cardiovascular;  Laterality: N/A;   VENTRICULAR ASSIST DEVICE INSERTION N/A 12/31/2020   Procedure: VENTRICULAR ASSIST DEVICE INSERTION;  Surgeon: Adrian Prows, MD;  Location: [place] CV LAB;  Service: Cardiovascular;  Laterality: N/A;   Patient Active Problem List   Diagnosis Date Noted   Chronic right-sided congestive heart failure ([place]) 05/18/2022   Ejection fraction < 50% 04/17/2022   Insomnia    Constipation    S/P BKA (below knee amputation), right ([place]) 03/21/2022   Ischemic cardiomyopathy    AKI (acute kidney injury) ([place]) 03/13/2022   Streptococcal bacteremia 03/13/2022   Hypophosphatemia 03/13/2022   Septic shock ([place]) 66/44/0347   Acute systolic heart failure ([place]) 12/28/2020   Acute pulmonary edema ([place]) 10/11/2020   Non-ST elevation (NSTEMI) myocardial infarction ([place]) 10/11/2020   Acute respiratory distress 10/11/2020   Acute on chronic combined systolic and diastolic CHF (congestive heart failure) ([place]) 10/11/2020   Cardiogenic shock (H[facility])    Partial nontraumatic amputation of foot, right ([place]) 06/21/2019   Osteomyelitis of right foot ([place])    Ischemic ulcer of lower leg due to atherosclerosis ([place]) 02/28/2019   Essential hypertension, benign 02/06/2019   Hypercholesteremia 02/06/2019   Stage 3b chronic kidney disease (CKD) ([place]) 10/06/2017   Proteinuria 10/06/2017   Controlled type 2 diabetes mellitus with hyperglycemia, with long-term current use of insulin ([place]) 10/06/2017   Coronary artery disease involving native coronary artery of native  heart without angina pectoris 05/23/2017   Abnormal nuclear stress test 09/06/2016   PAD (peripheral artery disease) ([place]) 10/28/2012   Morbid obesity with BMI of 40.0-44.9, adult ([place]) 10/13/2012   Diabetes mellitus with ophthalmic complication ([place]) 42/59/5638   Hypertension associated with diabetes ([place]) 10/13/2012   Atherosclerosis of native artery of extremity with intermittent claudication ([place]) 04/05/2012   Adenomatous colon polyp 09/28/2011   Diabetes mellitus ([place]) 09/28/2011   Pure hyperglyceridemia 09/28/2011   Erectile dysfunction 09/28/2011   Diabetic retinopathy ([place]) 09/28/2011      ONSET DATE: 06/25/2022 prosthesis delivery  REFERRING DIAG:Z89.511 (ICD-10-CM) - S/P below knee amputation, right  THERAPY DIAG:  Other abnormalities of gait and mobility  Unsteadiness on feet  Muscle weakness (generalized)  Abnormal posture  Pain in right lower leg  Rationale for Evaluation and Treatment Rehabilitation  SUBJECTIVE:   SUBJECTIVE STATEMENT: This 66yo male underwent a right Transtibial Amputation on 03/11/2022 due to osteomyelitis.  He was admitted to hospital 02/26/2022 in septic shock with TTA & TEE for endocarditis during stay. His prosthesis was delivered 06/25/2022.  Pt accompanied by: self  PERTINENT HISTORY: combined systolic & diastolic CHF/cardiomyopathy EF 15-20%, DM2, PVD/PAD, CAD, CABG 2017, HTN, HLD, morbid obesity, CKD st3b, diabetic retinopathy  PAIN:  Are you having pain? Yes: NPRS scale: 1/10 today & in last 5 days up to 3/10 Pain location: right distal tibia & sometimes knee Pain description: sharp distal limb Aggravating factors: standing & walking in prosthesis Relieving factors: sitting down  PRECAUTIONS: None  WEIGHT BEARING RESTRICTIONS No  FALLS: Has patient fallen in last 6 months? No  LIVING ENVIRONMENT: Lives with: lives with their spouse Lives in: House/apartment Home Access: single step from garage and Ramped over threshold &  incline driveway Home layout: One level Has following equipment at home: Single point cane, Environmental consultant - 2 wheeled, Wheelchair (manual), shower chair, Shower bench, Grab bars, and Ramped entry  PLOF: Independent, Independent with household mobility without device, and Independent with community mobility without device  PATIENT GOALS   to use prosthesis to go fishing, be active in community, climb ladder  OBJECTIVE:   COGNITION: Overall cognitive status: Within functional limits for tasks assessed  POSTURE: rounded shoulders, forward head, and weight shift left  LOWER EXTREMITY ROM:  ROM P:passive  A:active Right eval Left eval  Hip flexion    Hip extension    Hip abduction    Hip adduction    Hip internal rotation    Hip external rotation    Knee flexion    Knee extension 0*   Ankle dorsiflexion    Ankle plantarflexion    Ankle inversion    Ankle eversion     (Blank rows = not tested)  LOWER EXTREMITY MMT:  MMT Right eval Left eval  Hip flexion    Hip extension 4/5   Hip abduction 4/5   Hip adduction    Hip internal rotation    Hip external rotation    Knee flexion 4/5   Knee extension 4/5   Ankle dorsiflexion NA   Ankle plantarflexion NA   Ankle inversion NA   Ankle eversion NA   Evaluation testing functionally (Blank rows = not tested)  TRANSFERS: Sit to stand: SBA from chairs with armrests using back of LLE against chair to stabilize Stand to sit:  SBA using UEs to control descent  GAIT: Gait pattern: step to pattern, decreased step length- Left, decreased stance time- Right, decreased stride length, decreased hip/knee flexion- Right, Right hip hike, antalgic, and abducted- Right Distance walked: 100' Assistive device utilized: Environmental consultant - 2 wheeled and TTA prosthesis Level of assistance: SBA  needs cues for gait deviations / no assist physically with RW support Comments: excessive UE weight bearing on RW to limit weight on prosthesis.   FUNCTIONAL TESTs:    Merrilee Jansky Balance Scale: 06/30/2022:   20/56  [facility] PT Assessment - 06/30/22 1345       Standardized Balance Assessment   Standardized Balance Assessment Berg Balance Test      Berg Balance Test   Sit to Stand Needs minimal aid to stand or to stabilize    Standing Unsupported Able to stand 2 minutes with supervision    Sitting with Back Unsupported but Feet Supported on Floor or Stool Able to sit safely and securely 2 minutes    Stand to Sit Controls descent by using hands    Transfers Able to transfer safely, definite need of hands    Standing Unsupported with Eyes Closed Unable to keep eyes closed 3 seconds but stays steady    Standing Unsupported  with Feet Together Needs help to attain position but able to stand for 30 seconds with feet together    From Standing, Reach Forward with Outstretched Arm Reaches forward but needs supervision    From Standing Position, Pick up Object from Floor Unable to try/needs assist to keep balance    From Standing Position, Turn to Look Behind Over each Shoulder Needs supervision when turning    Turn 360 Degrees Needs assistance while turning    Standing Unsupported, Alternately Place Feet on Step/Stool Needs assistance to keep from falling or unable to try    Standing Unsupported, One Foot in [place] help to step but can hold 15 seconds    Standing on One Leg Tries to lift leg/unable to hold 3 seconds but remains standing independently    Total Score 20    Berg comment: BERG  < 36 high risk for falls (close to 100%) 46-51 moderate (>50%)   37-45 significant (>80%) 52-55 lower (> 25%)             CURRENT PROSTHETIC WEAR ASSESSMENT: Patient is dependent with: skin check, residual limb care, prosthetic cleaning, ply sock cleaning, correct ply sock adjustment, proper wear schedule/adjustment, and proper weight-bearing schedule/adjustment Donning prosthesis: SBA Doffing prosthesis: SBA Prosthetic wear tolerance: 2 hours, 2x/day, 5 days since  delivery Prosthetic weight bearing tolerance: 5 minutes with residual limb pain 3-4/10 Edema: pitting Residual limb condition:  scab medially 89m and dry scab lateral scar 389m  Good hair growth, normal color & temperature.  Cylindrical shape Prosthetic description: silicon liner with pin lock suspension, total contact socket, dynamic response foot K code/activity level with prosthetic use: Level 3   TODAY'S TREATMENT:   PATIENT EDUCATION: PATIENT EDUCATED ON FOLLOWING PROSTHETIC CARE: Education details:   Skin check, Residual limb care, Prosthetic cleaning, Correct ply sock adjustment, [place] donning, and Proper wear schedule/adjustment Prosthetic wear tolerance: 2 hours 2-4/day, off 2hrs between wear including liner, 7 days/week Person educated: Patient Education method: Explanation, Demonstration, Tactile cues, and Verbal cues Education comprehension: verbalized understanding, verbal cues required, tactile cues required, and needs further education   HOME EXERCISE PROGRAM:   ASSESSMENT:  CLINICAL IMPRESSION: Patient is a [age].o. male who was seen today for physical therapy evaluation and treatment for prosthetic training with right Transtibial Amputation. Patient underwent aTranstibial Amputation on 03/11/2022 and received the first prosthesis on 06/25/2022.  Patient is dependent in proper prosthetic care, some skin issues and has pain in residual limb with weight bearing.  Patient is only wearing prosthesis up to 4 hours total  which limits function during his day.  Berg Balance score of 20/56 and requires assistance without UE support indicates high fall risk.  Patient's prosthetic gait has significant deviations including excessive UE weight bearing indicating high fall risk. Patient would benefit from skilled PT to improve function & safety with her prosthesis.  OBJECTIVE IMPAIRMENTS Abnormal gait, cardiopulmonary status limiting activity, decreased activity tolerance, decreased  balance, decreased endurance, decreased knowledge of condition, decreased knowledge of use of DME, decreased mobility, difficulty walking, decreased strength, increased edema, postural dysfunction, prosthetic dependency , obesity, pain, and skin integrity issues on residual limb .   ACTIVITY LIMITATIONS carrying, lifting, bending, sitting, standing, squatting, stairs, transfers, locomotion level, and safe prosthesis use  PARTICIPATION LIMITATIONS: driving, community activity, and recreational activities  PE[place] since onset of injury/illness/exacerbation, and 3+ comorbidities: see PMH  are also affecting patient's functional outcome.   REHAB POTENTIAL: Good  CLINICAL DECISION MAKING: Evolving/moderate  complexity  EVALUATION COMPLEXITY: Moderate   GOALS: Goals reviewed with patient? Yes  SHORT TERM GOALS: Target date: 07/29/2022  Patient donnes prosthesis modified independent & verbalizes proper cleaning. Baseline: SEE OBJECTIVE DATA Goal status: INITIAL 2.  Patient tolerates prosthesis >12 hrs total /day without increase in skin issues or limb pain </= 2/10 after standing. Baseline: SEE OBJECTIVE DATA Goal status: INITIAL  3.  Patient able to reach 10", pick up items from floor and look over both shoulders without UE support with supervision. Baseline: SEE OBJECTIVE DATA Goal status: INITIAL  4. Patient ambulates 200' with cane & prosthesis with supervision. Baseline: SEE OBJECTIVE DATA Goal status: INITIAL  5. Patient negotiates ramps & curbs with cane & prosthesis with minA. Baseline: SEE OBJECTIVE DATA Goal status: INITIAL   LONG TERM GOALS: Target date: 09/24/2022  Patient demonstrates & verbalized understanding of prosthetic care to enable safe utilization of prosthesis. Baseline: SEE OBJECTIVE DATA Goal status: INITIAL  Patient tolerates prosthesis wear >90% of awake hours without skin or limb pain issues. Baseline: SEE OBJECTIVE DATA Goal  status: INITIAL  Berg Balance >/= 40/56  to indicate lower fall risk Baseline: SEE OBJECTIVE DATA Goal status: INITIAL  Patient ambulates >500' with prosthesis & cane or less independently Baseline: SEE OBJECTIVE DATA Goal status: INITIAL  Patient negotiates ramps, curbs & stairs with single rail with prosthesis cane or less independently. Baseline: SEE OBJECTIVE DATA Goal status: INITIAL  Patient demonstrates & verbalizes climbing ladder, lifting, carrying, pushing, pulling with prosthesis safely. Baseline: SEE OBJECTIVE DATA Goal status: INITIAL  Patient verbalizes & demonstrates understanding of fishing simulations.  Baseline: SEE OBJECTIVE DATA Goal status: INITIAL  PLAN: PT FREQUENCY: 2x/week  PT DURATION: 90 days / 13 weeks  PLANNED INTERVENTIONS: Therapeutic exercises, Therapeutic activity, Neuromuscular re-education, Balance training, Gait training, Patient/Family education, Stair training, Vestibular training, Prosthetic training, DME instructions, and Physical Performance Testing  PLAN FOR NEXT SESSION: Instruct in adjusting ply socks, instruct in HEP at sink, check residual limb    Jamey Reas, PT, DPT 06/30/2022, 2:35 PM

## 2022-07-04 NOTE — Progress Notes (Unsigned)
No chief complaint on file.   Eduardo [name] is a 66 y.o. male who presents for complete physical, Medicare annual wellness visit, as well as follow up on his chronic problems.  Patient is s/p R BKA in 03/2022.  He has gotten his prosthesis and is getting PT. He had a pectoralis tear when discharged from the hospital. HE SHOULD BE BACK ON ASA AND PLAVIX??   DM is managed by Dr. Chalmers Cater. Last seen in mid-August.  He is now on Mounjaro, and no longer on any insulin. WHEN STOPPED? He was taken off Jardiance in 12/2021 (?contributing to wounds). Lab Results  Component Value Date   HGBA1C 6.6 06/23/2022   He has CGM. Sugars are running    Denies polydipsia, urinary frequency only due to diuretic. Under the care of Dr. Zigmund Daniel for diabetic retinopathy, scheduled for tomorrow. Hasn't needing any further injections (edema improving). He has peripheral neuropathy with numbness in his L foot, not painful.     He has 3V CAD, under the care of Dr. Einar Gip. He had new onset LBBB and acute fulminant pulmonary edema in 10/2020. Emergent cardiac catheterization showed progression of severe native vessel disease, no significant option for revascularization (LAD was potentially amenable but RCA was felt to be the culprit). He is continues with medical management per cardiologists. He was hospitalized in 12/5850 with systolic heart failure (due to ischemic cardiomyopathy). Attempted PCI to LAD CTO, unsuccessful.  EF <20% in 12/2020. Decided against VAD, heart transplant. No longer seeing Dr. Haroldine Laws. Torsemide and spirnolactone were started, the latter was stopped due to elevated K+. Continues on torsemide (sliding scale dose, depending on edema/weights).  He denies chest pain, DOE, syncope, orthopnea.  No longer having any edema. He continues on aspirin and Plavix (had been stopped related to hematoma from pectoralis muscle tear), carvedilol, Entresto, rosuvastatin. Jardiance was stopped as there was concern it  was contributing to his ulcers.  PAD, under the care of Dr. Einar Gip.  S/p R stent 02/2019. 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 He denies any further skin breakdown or ulcers. He checks his stump and LLE regularly.  Stage 3 CKD--under the care of Dr. Hollie Salk. Last seen in July 2023. No med changes were made. Last Cr 1.39 at [facility]. He has Lokelma on hand if needed, but K+ has been normal.  He is not interested in dialysis if it comes to that (was told he couldn't do HD, wouldn't want PD). Lab Results  Component Value Date   CREATININE 1.40 (H) 04/17/2022    Obesity: He started at [employer] January, 2018; initial weight was 336#. He is still on [employer], and really watches his food choices and portion control. Darcel Bayley is also helping with weight loss.   HTN--he doesn't check BP's elsewhere, but checked by all his other doctors, always okay. BP's have been on the lower side, but he is asymptomatic.  Denies dizziness, headaches, no longer having edema. BP Readings from Last 3 Encounters:  05/18/22 110/70  04/21/22 (!) 98/41  04/20/22 (!) 80/50   Mixed hyperlipidemia:  He is compliant with gemfibrozil '600mg'$  BID, fish oil once daily, crestor '20mg'$  and zetia.  Zetia was added 06/2021, due to LDL being >70. This is managed by cardiology.  He is following a lowfat, low cholesterol diet. Last check on this regimen was 08/2021, and was at goal: Lab Results  Component Value Date   CHOL 96 (L) 08/18/2021   HDL 30 (L)  08/18/2021   LDLCALC 42 08/18/2021   TRIG 138 08/18/2021   CHOLHDL 3.9 06/26/2021    Dupuytren's contractures--Can't straighten right pinkie or left ring finger.  There is some discomfort/aching, relieved by aspercreme or Voltaren gel.  Prior injection with Dr. Lenon Curt wasn't beneficial (only slight improvement, temporarily). Not bothersome, he is able to fish.  He uses some topical medications when it is uncomfortable.  Erectile dysfunction:  Not on meds (due to CAD,  nitrates).  Offered referral to urologist in the past to discuss other options, but he declined. He is not currently in a sexual relationshp   Immunization History  Administered Date(s) Administered   Influenza Split 08/10/2011, 08/19/2020   Influenza, Quadrivalent, Recombinant, Inj, Pf 08/21/2019, 08/19/2020   Influenza,inj,Quad PF,6+ Mos 10/15/2016, 10/15/2017, 08/09/2018   Influenza-Unspecified 09/01/2021   PFIZER(Purple Top)SARS-COV-2 Vaccination 01/31/2020, 02/28/2020, 10/10/2020   Pfizer Covid-19 Vaccine Bivalent Booster 52yr & up 07/17/2021, 05/06/2022   Pneumococcal Conjugate-13 05/25/2017   Pneumococcal Polysaccharide-23 09/28/2011, 06/26/2021   Td 06/03/2005   Tdap 09/28/2011, 05/06/2022   Zoster Recombinat (Shingrix) 06/06/2018, 08/09/2018   Last colonoscopy: 11/2014, Dr.Magod-ext and int hemorrhoids and diverticulosis. (that was a 3 yr f/u, h/o polyps in past).  Was due again 2021. (He previously stated that he called Dr. MWatt Climesbut never got a response.) Discussed him not being a good candidate at 06/2021 visit. Last PSA:  appears to be rising by 0.5 per year Lab Results  Component Value Date   PSA1 3.8 06/24/2020   PSA1 3.3 06/12/2019   PSA1 2.8 06/06/2018   PSA 2.3 05/25/2017   PSA 1.09 09/28/2011   Dentist: twice yearly Ophtho: regularly (due to retinopathy). Exercise:  Peloton bike 3x/week for 20 minutes. Does some weights with it 2x/week.  He is also walking 2 miles/day.  Patient Care Team: KRita Ohara MD as PCP - General (Family Medicine) BJacelyn Pi MD as Attending Physician (Endocrinology) MHayden Pedro MD (Ophthalmology) CAlethia Berthold PA-C (Inactive) (Cardiology) Cardiology: Dr. GEinar GipNephro: Dr. URoylene Reason Dr. HRenda RollsGI: Dr. MWatt ClimesOphtho: Dr. MZigmund Daniel Dr. KPeter Garter(no longer sees KPeter Garter Podiatry: Dr. WJacqualyn Posey Dentist: [facility]. [place]phus [place] [facility] [facility]  PHQ-2 Total Score [place] [facility]  PHQ-9 Total Score 0       Falls screen:     05/18/2022   11:28 AM 06/26/2021    8:44 AM 06/24/2020    8:40 AM  [place] Cityin the past year? 0 0 0  Number falls in past yr: 0 0   Injury with Fall?  0   Risk for fall due to :  No Fall Risks   Follow up  Falls evaluation completed      Functional Status Survey:         End of Life Discussion:  Patient has a living will and HCPOA, scanned. MOST updated 06/2021 to reflect that he is willing to be intubated and go to ICU, but DNR if arrests. No longterm use of life support. Patient has DNR (dated 10/2020)     PMH, PSH, SH and FH were reviewed and updated.   ROS:  The patient denies anorexia, fever, headaches, decreased hearing, ear pain, hoarseness, chest pain, palpitations, dizziness, syncope, dyspnea on exertion, cough, swelling, nausea, vomiting, diarrhea, constipation, abdominal pain,  melena, hematochezia, indigestion/heartburn, hematuria, incontinence, nocturia, dysuria, genital lesions, joint pains, weakness, tremor, suspicious skin lesions, depression, anxiety, abnormal bleeding/bruising, or enlarged lymph nodes. Denies significant vision changes. Rare tinnitus Erectile dysfunction Numbness in L foot.  No phantom limb pain on R. Bleeding or bruising? Edema?    PHYSICAL EXAM:  There were no vitals taken for this visit.  Wt Readings from Last 3 Encounters:  04/21/22 258 lb 3.2 oz (117.1 kg)  04/20/22 258 lb 3.2 oz (117.1 kg)  04/17/22 267 lb (121.1 kg)    General Appearance:    Alert, cooperative, no distress, appears stated age. Head is bald/shaven  Head:    Normocephalic, without obvious abnormality, atraumatic  Eyes:    PERRL, conjunctiva/corneas clear, EOM's intact, fundi not visualized  Ears:    Normal TM's and external ear canals   Nose:   Nornal, no drainage or sinus tenderness  Throat:   Normal mucosa, no lesions  Neck:   Supple, no lymphadenopathy;  thyroid:  no enlargement/ tenderness/nodules; no carotid bruit or JVD  Back:    Spine nontender, no curvature, ROM normal, no CVA   tenderness  Lungs:     Clear to auscultation bilaterally without wheezes, rales or ronchi; respirations unlabored  Chest Wall:    No tenderness or deformity   Heart:    Regular rate and rhythm, S1 and S2 normal, no murmur, rub or gallop  Breast Exam:    No chest wall tenderness, or masses   Abdomen:     Soft, non-tender, obese; nondistended, normoactive bowel sounds, no masses, no hepatosplenomegaly  Genitalia:    Normal male external genitalia without lesions.  Testicles without masses.  No inguinal hernias.  Rectal:    Normal sphincter tone, no masses or tenderness; guaiac negative stool.  Prostate smooth, no nodules, mildly enlarged.  Extremities:  No clubbing, cyanosis or edema of LLE or R stump.  Pulses:   Palpable distal pulses LLE, though diminished.   Skin:   Turgor normal, no suspicious lesions.  Lymph nodes:   Cervical, supraclavicular, and inguinal lymph nodes normal  Neurologic:    Normal strength, gait; reflexes 2+ and symmetric throughout.                                Psych:   Normal mood, affect, hygiene and grooming.  DIABETIC FOOT EXAM L FOOT  ***update R stump, pec wall deformity/weakness/bruising Update pulses on L   ASSESSMENT/PLAN:  I think Celeste Cantwell left the practice--says inactive. Can remove from care team We never got DM eye exam from Dr. Zigmund Daniel (Adam should have requested last year)--not sure if he has been in the last year.  He has appt tomorrow--we need to get copy of note and abstract this (gap in care).  He should be back on aspirin and plavix? I think he is no longer on insulin?  No labs needed. Can wait for lipids until his October cardiology appt, since they manage/rx. I don't think I rx  any of his meds, so no RF needed  DM diagnosis   Discussed PSA screening (risks/benefits), screening not indicated at this time (based on life expectancy/comorbidities), recommended at least 30 minutes of aerobic activity at least 5 days/week; proper sunscreen use reviewed; healthy diet and alcohol recommendations (less than or equal to 2 drinks/day) reviewed; regular seatbelt use; changing batteries in smoke detectors. Immunization recommendations discussed--continue yearly high dose flu shots. Updated COVID booster recommended  when available in the Fall. RSV vaccine recommended, to get from the pharmacy. Colonoscopy recommendations reviewed, was due 11/2019.  Not a good candidate.   MOST form completed, DNR, may intubate, no prolonged measures. New paperwork given for Living Will and Healthcare POA (needs to re-do the latter).   Medicare Attestation I have personally reviewed: The patient's medical and social history Their use of alcohol, tobacco or illicit drugs Their current medications and supplements The patient's functional ability including ADLs,fall risks, home safety risks, cognitive, and hearing and visual impairment Diet and physical activities Evidence for depression or mood disorders  The patient's weight, height, BMI have been recorded in the chart.  I have made referrals, counseling, and provided education to the patient based on review of the above and I have provided the patient with a written personalized care plan for preventive services.     Vikki Ports, MD

## 2022-07-04 NOTE — Patient Instructions (Incomplete)
  HEALTH MAINTENANCE RECOMMENDATIONS:  It is recommended that you get at least 30 minutes of aerobic exercise at least 5 days/week (for weight loss, you may need as much as 60-90 minutes). This can be any activity that gets your heart rate up. This can be divided in 10-15 minute intervals if needed, but try and build up your endurance at least once a week.  Weight bearing exercise is also recommended twice weekly.  Eat a healthy diet with lots of vegetables, fruits and fiber.  "Colorful" foods have a lot of vitamins (ie green vegetables, tomatoes, red peppers, etc).  Limit sweet tea, regular sodas and alcoholic beverages, all of which has a lot of calories and sugar.  Up to 2 alcoholic drinks daily may be beneficial for men (unless trying to lose weight, watch sugars).  Drink a lot of water.  Sunscreen of at least SPF 30 should be used on all sun-exposed parts of the skin when outside between the hours of 10 am and 4 pm (not just when at beach or pool, but even with exercise, golf, tennis, and yard work!)  Use a sunscreen that says "broad spectrum" so it covers both UVA and UVB rays, and make sure to reapply every 1-2 hours.  Remember to change the batteries in your smoke detectors when changing your clock times in the spring and fall.  Carbon monoxide detectors are recommended for your home.  Use your seat belt every time you are in a car, and please drive safely and not be distracted with cell phones and texting while driving.    Eduardo [name] , Thank you for taking time to come for your Medicare Wellness Visit. I appreciate your ongoing commitment to your health goals. Please review the following plan we discussed and let me know if I can assist you in the future.   This is a list of the screening recommended for you and due dates:  Health Maintenance  Topic Date Due   Eye exam for diabetics  09/09/2018   Complete foot exam   07/09/2021   Flu Shot  06/09/2022   Hemoglobin A1C  12/24/2022    Colon Cancer Screening  11/22/2024   Tetanus Vaccine  05/06/2032   Pneumonia Vaccine  Completed   COVID-19 Vaccine  Completed   Hepatitis C Screening: USPSTF Recommendation to screen - Ages 3-79 yo.  Completed   Zoster (Shingles) Vaccine  Completed   HPV Vaccine  Aged Out   Continue to get yearly high dose flu shots. I recommend getting the updated COVID booster when it becomes available (late September?). We will have to wait and see how long they recommend waiting between doses, 3 vs 4 months, to see when you can get this. I recommend getting the new RSV vaccine.  I believe you will need to get this from the pharmacy.  You either get vaccines the same day, or separate them by at least 2 weeks.

## 2022-07-06 ENCOUNTER — Ambulatory Visit (INDEPENDENT_AMBULATORY_CARE_PROVIDER_SITE_OTHER): Payer: Medicare Other | Admitting: Family Medicine

## 2022-07-06 ENCOUNTER — Encounter: Payer: Self-pay | Admitting: Family Medicine

## 2022-07-06 VITALS — BP 100/60 | HR 72 | Ht 70.0 in | Wt 252.4 lb

## 2022-07-06 DIAGNOSIS — E782 Mixed hyperlipidemia: Secondary | ICD-10-CM | POA: Diagnosis not present

## 2022-07-06 DIAGNOSIS — I739 Peripheral vascular disease, unspecified: Secondary | ICD-10-CM

## 2022-07-06 DIAGNOSIS — E1169 Type 2 diabetes mellitus with other specified complication: Secondary | ICD-10-CM

## 2022-07-06 DIAGNOSIS — E11319 Type 2 diabetes mellitus with unspecified diabetic retinopathy without macular edema: Secondary | ICD-10-CM

## 2022-07-06 DIAGNOSIS — I255 Ischemic cardiomyopathy: Secondary | ICD-10-CM | POA: Diagnosis not present

## 2022-07-06 DIAGNOSIS — Z794 Long term (current) use of insulin: Secondary | ICD-10-CM

## 2022-07-06 DIAGNOSIS — N1831 Chronic kidney disease, stage 3a: Secondary | ICD-10-CM

## 2022-07-06 DIAGNOSIS — Z89511 Acquired absence of right leg below knee: Secondary | ICD-10-CM

## 2022-07-06 DIAGNOSIS — Z0000 Encounter for general adult medical examination without abnormal findings: Secondary | ICD-10-CM | POA: Diagnosis not present

## 2022-07-06 DIAGNOSIS — I5022 Chronic systolic (congestive) heart failure: Secondary | ICD-10-CM

## 2022-07-06 DIAGNOSIS — D692 Other nonthrombocytopenic purpura: Secondary | ICD-10-CM | POA: Diagnosis not present

## 2022-07-06 DIAGNOSIS — Z6836 Body mass index (BMI) 36.0-36.9, adult: Secondary | ICD-10-CM

## 2022-07-07 ENCOUNTER — Ambulatory Visit (INDEPENDENT_AMBULATORY_CARE_PROVIDER_SITE_OTHER): Payer: Medicare Other | Admitting: Physical Therapy

## 2022-07-07 ENCOUNTER — Encounter: Payer: Self-pay | Admitting: Physical Therapy

## 2022-07-07 ENCOUNTER — Encounter (INDEPENDENT_AMBULATORY_CARE_PROVIDER_SITE_OTHER): Payer: Medicare Other | Admitting: Ophthalmology

## 2022-07-07 DIAGNOSIS — M79661 Pain in right lower leg: Secondary | ICD-10-CM | POA: Diagnosis not present

## 2022-07-07 DIAGNOSIS — R2681 Unsteadiness on feet: Secondary | ICD-10-CM

## 2022-07-07 DIAGNOSIS — I10 Essential (primary) hypertension: Secondary | ICD-10-CM

## 2022-07-07 DIAGNOSIS — R2689 Other abnormalities of gait and mobility: Secondary | ICD-10-CM

## 2022-07-07 DIAGNOSIS — R293 Abnormal posture: Secondary | ICD-10-CM

## 2022-07-07 DIAGNOSIS — H43813 Vitreous degeneration, bilateral: Secondary | ICD-10-CM | POA: Diagnosis not present

## 2022-07-07 DIAGNOSIS — H35033 Hypertensive retinopathy, bilateral: Secondary | ICD-10-CM

## 2022-07-07 DIAGNOSIS — M6281 Muscle weakness (generalized): Secondary | ICD-10-CM | POA: Diagnosis not present

## 2022-07-07 DIAGNOSIS — E113592 Type 2 diabetes mellitus with proliferative diabetic retinopathy without macular edema, left eye: Secondary | ICD-10-CM | POA: Diagnosis not present

## 2022-07-07 DIAGNOSIS — E113391 Type 2 diabetes mellitus with moderate nonproliferative diabetic retinopathy without macular edema, right eye: Secondary | ICD-10-CM

## 2022-07-07 LAB — HM DIABETES EYE EXAM

## 2022-07-07 NOTE — Therapy (Signed)
OUTPATIENT PHYSICAL THERAPY TREATMENT NOTE   Patient Name: Eduardo [name] MRN: [id number] DOB:11/07/1956, 66 y.o., male Today's Date: 07/07/2022  PCP: Rita Ohara, MD REFERRING PROVIDER: Newt Minion, MD  END OF SESSION:   PT End of Session - 07/07/22 1347     Visit Number 2    Number of Visits 26    Date for PT Re-Evaluation 09/24/22    Authorization Type [employer] Medicare    Authorization Time Period $20 copay    Progress Note Due on Visit 10    PT Start Time 1345    PT Stop Time 1429    PT Time Calculation (min) 44 min    Equipment Utilized During Treatment Gait belt    Activity Tolerance Patient tolerated treatment well;Patient limited by pain    Behavior During Therapy WFL for tasks assessed/performed             Past Medical History:  Diagnosis Date   CHF (congestive heart failure) ([place])    Chronic kidney disease    stage 3   Colon polyp    Coronary artery disease    Diabetes mellitus 1987   under care of Dr. Chalmers Cater.  On insulin since 96 (off and on)   Diabetic retinopathy    Dupuytren contracture    R hand, s/p injection (Dr. Lenon Curt)   Essential hypertension, benign    Essential hypertension, benign 02/06/2019   Frequency of urination and polyuria    Hypertension    Myocardial infarction [facility])    denies   Neuromuscular disorder ([place])    Diabetic neuropathy   Osteomyelitis ([place])    right foot   Other testicular hypofunction    Peripheral arterial disease ([place]) 10/28/2012   Peritoneal abscess ([place]) 6/08   and buttock.   Pneumonia    Polydipsia    Proteinuria    Pure hyperglyceridemia    Subacute osteomyelitis, right ankle and foot ([place])    Wears glasses    Past Surgical History:  Procedure Laterality Date   ABDOMINAL AORTAGRAM N/A 04/18/2012   Procedure: ABDOMINAL Maxcine Ham;  Surgeon: Angelia Mould, MD;  Location: [facility] CATH LAB;  Service: Cardiovascular;  Laterality: N/A;   AMPUTATION Right 05/19/2019   Procedure: RIGHT FOOT 5TH RAY AMPUTATION;   Surgeon: Newt Minion, MD;  Location: [place];  Service: Orthopedics;  Laterality: Right;   AMPUTATION Right 03/11/2022   Procedure: RIGHT LEG DEBRIDEMENT VS. BELOW KNEE AMPUTATION;  Surgeon: Newt Minion, MD;  Location: [place];  Service: Orthopedics;  Laterality: Right;   CARDIAC CATHETERIZATION N/A 09/08/2016   Procedure: Left Heart Cath and Coronary Angiography;  Surgeon: Adrian Prows, MD;  Location: [place] CV LAB;  Service: Cardiovascular;  Laterality: N/A;   CATARACT EXTRACTION, BILATERAL  09/2017, 10/2017   Dr. Herbert Deaner   COLONOSCOPY W/ BIOPSIES AND POLYPECTOMY     CORONARY/GRAFT ACUTE MI REVASCULARIZATION N/A 10/11/2020   Procedure: Coronary/Graft Acute MI Revascularization;  Surgeon: Adrian Prows, MD;  Location: [place] CV LAB;  Service: Cardiovascular;  Laterality: N/A;   I & D EXTREMITY Right 03/11/2022   Procedure: BELOW KNEE AMPUTATION;  Surgeon: Newt Minion, MD;  Location: [place];  Service: Orthopedics;  Laterality: Right;   LEFT HEART CATH N/A 12/31/2020   Procedure: Left Heart Cath;  Surgeon: Adrian Prows, MD;  Location: [place] CV LAB;  Service: Cardiovascular;  Laterality: N/A;   LEFT HEART CATH AND CORONARY ANGIOGRAPHY N/A 10/11/2020   Procedure: LEFT HEART CATH AND CORONARY ANGIOGRAPHY;  Surgeon: Adrian Prows, MD;  Location: [place] CV LAB;  Service: Cardiovascular;  Laterality: N/A;   LOWER EXTREMITY ANGIOGRAM Bilateral 04/18/2012   Procedure: LOWER EXTREMITY ANGIOGRAM;  Surgeon: Angelia Mould, MD;  Location: [facility] CATH LAB;  Service: Cardiovascular;  Laterality: Bilateral;  bilat lower extrem angio   LOWER EXTREMITY ANGIOGRAPHY Bilateral 05/02/2019   Procedure: LOWER EXTREMITY ANGIOGRAPHY;  Surgeon: Adrian Prows, MD;  Location: [place] CV LAB;  Service: Cardiovascular;  Laterality: Bilateral;   LOWER EXTREMITY ANGIOGRAPHY Bilateral 02/28/2019   Procedure: LOWER EXTREMITY ANGIOGRAPHY;  Surgeon: Adrian Prows, MD;  Location: [place] CV LAB;  Service: Cardiovascular;   Laterality: Bilateral;   macular photocoagulation     (eye treatments for diabetic retinopathy)-Dr. Zigmund Daniel   PERIPHERAL VASCULAR INTERVENTION  02/28/2019   Procedure: PERIPHERAL VASCULAR INTERVENTION;  Surgeon: Adrian Prows, MD;  Location: [place] CV LAB;  Service: Cardiovascular;;   TEE WITHOUT CARDIOVERSION N/A 03/18/2022   Procedure: TRANSESOPHAGEAL ECHOCARDIOGRAM (TEE);  Surgeon: Adrian Prows, MD;  Location: [facility] ENDOSCOPY;  Service: Cardiovascular;  Laterality: N/A;   VENTRICULAR ASSIST DEVICE INSERTION N/A 12/31/2020   Procedure: VENTRICULAR ASSIST DEVICE INSERTION;  Surgeon: Adrian Prows, MD;  Location: [place] CV LAB;  Service: Cardiovascular;  Laterality: N/A;   Patient Active Problem List   Diagnosis Date Noted   Chronic right-sided congestive heart failure ([place]) 05/18/2022   Ejection fraction < 50% 04/17/2022   Insomnia    Constipation    S/P BKA (below knee amputation), right ([place]) 03/21/2022   Ischemic cardiomyopathy    AKI (acute kidney injury) ([place]) 03/13/2022   Streptococcal bacteremia 03/13/2022   Hypophosphatemia 03/13/2022   Septic shock ([place]) 94/17/4081   Acute systolic heart failure ([place]) 12/28/2020   Acute pulmonary edema ([place]) 10/11/2020   Non-ST elevation (NSTEMI) myocardial infarction ([place]) 10/11/2020   Acute respiratory distress 10/11/2020   Acute on chronic combined systolic and diastolic CHF (congestive heart failure) ([place]) 10/11/2020   Cardiogenic shock ([facility])    Partial nontraumatic amputation of foot, right ([place]) 06/21/2019   Osteomyelitis of right foot ([place])    Essential hypertension, benign 02/06/2019   Hypercholesteremia 02/06/2019   Stage 3b chronic kidney disease (CKD) ([place]) 10/06/2017   Proteinuria 10/06/2017   Controlled type 2 diabetes mellitus with hyperglycemia, with long-term current use of insulin ([place]) 10/06/2017   Coronary artery disease involving native coronary artery of native heart without angina pectoris 05/23/2017   Abnormal nuclear  stress test 09/06/2016   PAD (peripheral artery disease) ([place]) 10/28/2012   Morbid obesity with BMI of 40.0-44.9, adult ([place]) 10/13/2012   Diabetes mellitus with ophthalmic complication ([place]) 44/81/8563   Hypertension associated with diabetes ([place]) 10/13/2012   Atherosclerosis of native artery of extremity with intermittent claudication ([place]) 04/05/2012   Adenomatous colon polyp 09/28/2011   Diabetes mellitus ([place]) 09/28/2011   Pure hyperglyceridemia 09/28/2011   Erectile dysfunction 09/28/2011   Diabetic retinopathy ([place]) 09/28/2011    REFERRING DIAG: Z89.511 (ICD-10-CM) - S/P below knee amputation, right  ONSET DATE: 06/25/2022 prosthesis delivery  THERAPY DIAG:  Other abnormalities of gait and mobility  Unsteadiness on feet  Muscle weakness (generalized)  Abnormal posture  Pain in right lower leg  Rationale for Evaluation and Treatment Rehabilitation  PERTINENT HISTORY: combined systolic & diastolic CHF/cardiomyopathy EF 15-20%, DM2, PVD/PAD, CAD, CABG 2017, HTN, HLD, morbid obesity, CKD st3b, diabetic retinopathy  PRECAUTIONS: None  SUBJECTIVE: He has been wearing prosthesis 2 hours 3-4 x/day.    PAIN:  Are you having pain? No   OBJECTIVE: (  objective measures completed at initial evaluation unless otherwise dated)  OBJECTIVE:    COGNITION: 06/30/2022  Overall cognitive status: Within functional limits for tasks assessed   POSTURE: 06/30/2022  rounded shoulders, forward head, and weight shift left   LOWER EXTREMITY ROM:   ROM P:passive  A:active Right Eval 06/30/2022 Left eval  Hip flexion      Hip extension      Hip abduction      Hip adduction      Hip internal rotation      Hip external rotation      Knee flexion      Knee extension 0*    Ankle dorsiflexion      Ankle plantarflexion      Ankle inversion      Ankle eversion       (Blank rows = not tested)   LOWER EXTREMITY MMT:   MMT Right Eval 06/30/2022 Left eval  Hip flexion      Hip  extension 4/5    Hip abduction 4/5    Hip adduction      Hip internal rotation      Hip external rotation      Knee flexion 4/5    Knee extension 4/5    Ankle dorsiflexion NA    Ankle plantarflexion NA    Ankle inversion NA    Ankle eversion NA    Evaluation testing functionally (Blank rows = not tested)   TRANSFERS: 06/30/2022  Sit to stand: SBA from chairs with armrests using back of LLE against chair to stabilize 06/30/2022   Stand to sit:  SBA using UEs to control descent   GAIT: 06/30/2022 Gait pattern: step to pattern, decreased step length- Left, decreased stance time- Right, decreased stride length, decreased hip/knee flexion- Right, Right hip hike, antalgic, and abducted- Right Distance walked: 100' Assistive device utilized: Environmental consultant - 2 wheeled and TTA prosthesis Level of assistance: SBA  needs cues for gait deviations / no assist physically with RW support Comments: excessive UE weight bearing on RW to limit weight on prosthesis.    FUNCTIONAL TESTs:   Merrilee Jansky Balance Scale: 06/30/2022:   20/56   [facility] PT Assessment - 06/30/22 1345                Standardized Balance Assessment    Standardized Balance Assessment Berg Balance Test          Berg Balance Test    Sit to Stand Needs minimal aid to stand or to stabilize     Standing Unsupported Able to stand 2 minutes with supervision     Sitting with Back Unsupported but Feet Supported on Floor or Stool Able to sit safely and securely 2 minutes     Stand to Sit Controls descent by using hands     Transfers Able to transfer safely, definite need of hands     Standing Unsupported with Eyes Closed Unable to keep eyes closed 3 seconds but stays steady     Standing Unsupported with Feet Together Needs help to attain position but able to stand for 30 seconds with feet together     From Standing, Reach Forward with Outstretched Arm Reaches forward but needs supervision     From Standing Position, Pick up Object from Floor Unable to  try/needs assist to keep balance     From Standing Position, Turn to Look Behind Over each Shoulder Needs supervision when turning     Turn 360 Degrees Needs assistance while turning  Standing Unsupported, Alternately Place Feet on Step/Stool Needs assistance to keep from falling or unable to try     Standing Unsupported, One Foot in Front Needs help to step but can hold 15 seconds     Standing on One Leg Tries to lift leg/unable to hold 3 seconds but remains standing independently     Total Score 20     Berg comment: BERG  < 36 high risk for falls (close to 100%) 46-51 moderate (>50%)   37-45 significant (>80%) 52-55 lower (> 25%)                   CURRENT PROSTHETIC WEAR ASSESSMENT: 06/30/2022 Patient is dependent with: skin check, residual limb care, prosthetic cleaning, ply sock cleaning, correct ply sock adjustment, proper wear schedule/adjustment, and proper weight-bearing schedule/adjustment Donning prosthesis: SBA Doffing prosthesis: SBA Prosthetic wear tolerance: 2 hours, 2x/day, 5 days since delivery Prosthetic weight bearing tolerance: 5 minutes with residual limb pain 3-4/10 Edema: pitting Residual limb condition:  scab medially 63m and dry scab lateral scar 383m  Good hair growth, normal color & temperature.  Cylindrical shape Prosthetic description: silicon liner with pin lock suspension, total contact socket, dynamic response foot K code/activity level with prosthetic use: Level 3     TODAY'S TREATMENT:  07/07/2022 Prosthetic Training with TTA prosthesis PT educated pt in adjusting ply socks with too few, too many & correct ply. He verbalized better understanding. PT verbal cues on signs of sweating & need to dry limb / liner. Pt verbalized understanding.  PT increased wear to 3 hours on & 2 hours off throughout his awake hours and educated on rationale to progressing to all awake hours. Pt verbalized understanding.  PT instructed in Increasing your activity level  is important. -Short distances which is walking from one room to another. Work to increase frequency back to prior level. -Medium distances are entering & exiting your home or community with limited distances. Start with 4 medium walks which is one outing to one location and increase number of tolerated amounts per day. -Long distance is your highest tolerance for you. Walk until you feel you must rest. Back or leg pain or general fatigue are indicators to maximum tolerance. Monitor by distance or time. Try to walk your BEST distance 1-2 times per day. You should see this increase over time.  Pt verbalized understanding.  PT instructed with verbal & demo on rollator walker safety. Pt verbalized & return demo understanding including sit to/from stand from seat. PT instructed in neg ramp & curb with std RW & rollator. Pt return demo understanding.  PT demo & verbal cues on picking up item from floor. Pt return demo 3 reps & verbalized safe set-up to practice.  PT demo simulated casting / fishing with offset feet. Pt verbalized & return demo understanding.   06/30/2022 PATIENT EDUCATION: PATIENT EDUCATED ON FOLLOWING PROSTHETIC CARE: Education details:   Skin check, Residual limb care, Prosthetic cleaning, Correct ply sock adjustment, [place]pper donning, and Proper wear schedule/adjustment Prosthetic wear tolerance: 2 hours 2-4/day, off 2hrs between wear including liner, 7 days/week Person educated: Patient Education method: Explanation, Demonstration, Tactile cues, and Verbal cues Education comprehension: verbalized understanding, verbal cues required, tactile cues required, and needs further education     HOME EXERCISE PROGRAM:     ASSESSMENT:   CLINICAL IMPRESSION: Patient is tolerating increased wear without skin or limb pain issues.  He appears to have better understanding of adjusting ply socks, sweat management & increasing  activity level.  He has basic understanding of neg ramp & curb.     OBJECTIVE IMPAIRMENTS Abnormal gait, cardiopulmonary status limiting activity, decreased activity tolerance, decreased balance, decreased endurance, decreased knowledge of condition, decreased knowledge of use of DME, decreased mobility, difficulty walking, decreased strength, increased edema, postural dysfunction, prosthetic dependency , obesity, pain, and skin integrity issues on residual limb .    ACTIVITY LIMITATIONS carrying, lifting, bending, sitting, standing, squatting, stairs, transfers, locomotion level, and safe prosthesis use   PARTICIPATION LIMITATIONS: driving, community activity, and recreational activities   [place], Time since onset of injury/illness/exacerbation, and 3+ comorbidities: see PMH  are also affecting patient's functional outcome.    REHAB POTENTIAL: Good   CLINICAL DECISION MAKING: Evolving/moderate complexity   EVALUATION COMPLEXITY: Moderate     GOALS: Goals reviewed with patient? Yes   SHORT TERM GOALS: Target date: 07/29/2022   Patient donnes prosthesis modified independent & verbalizes proper cleaning. Baseline: SEE OBJECTIVE DATA Goal status: INITIAL 2.  Patient tolerates prosthesis >12 hrs total /day without increase in skin issues or limb pain </= 2/10 after standing. Baseline: SEE OBJECTIVE DATA Goal status: INITIAL   3.  Patient able to reach 10", pick up items from floor and look over both shoulders without UE support with supervision. Baseline: SEE OBJECTIVE DATA Goal status: INITIAL   4. Patient ambulates 200' with cane & prosthesis with supervision. Baseline: SEE OBJECTIVE DATA Goal status: INITIAL   5. Patient negotiates ramps & curbs with cane & prosthesis with minA. Baseline: SEE OBJECTIVE DATA Goal status: INITIAL     LONG TERM GOALS: Target date: 09/24/2022   Patient demonstrates & verbalized understanding of prosthetic care to enable safe utilization of prosthesis. Baseline: SEE OBJECTIVE DATA Goal status:  INITIAL   Patient tolerates prosthesis wear >90% of awake hours without skin or limb pain issues. Baseline: SEE OBJECTIVE DATA Goal status: INITIAL   Berg Balance >/= 40/56  to indicate lower fall risk Baseline: SEE OBJECTIVE DATA Goal status: INITIAL   Patient ambulates >500' with prosthesis & cane or less independently Baseline: SEE OBJECTIVE DATA Goal status: INITIAL   Patient negotiates ramps, curbs & stairs with single rail with prosthesis cane or less independently. Baseline: SEE OBJECTIVE DATA Goal status: INITIAL   Patient demonstrates & verbalizes climbing ladder, lifting, carrying, pushing, pulling with prosthesis safely. Baseline: SEE OBJECTIVE DATA Goal status: INITIAL   Patient verbalizes & demonstrates understanding of fishing simulations.  Baseline: SEE OBJECTIVE DATA Goal status: INITIAL   PLAN: PT FREQUENCY: 2x/week   PT DURATION: 90 days / 13 weeks   PLANNED INTERVENTIONS: Therapeutic exercises, Therapeutic activity, Neuromuscular re-education, Balance training, Gait training, Patient/Family education, Stair training, Vestibular training, Prosthetic training, DME instructions, and Physical Performance Testing   PLAN FOR NEXT SESSION:  instruct in HEP at sink, check residual limb  Jamey Reas, PT, DPT 07/07/2022, 3:30 PM

## 2022-07-08 ENCOUNTER — Ambulatory Visit: Payer: Medicare Other | Admitting: Student

## 2022-07-14 ENCOUNTER — Other Ambulatory Visit: Payer: Self-pay

## 2022-07-14 ENCOUNTER — Encounter: Payer: Self-pay | Admitting: Internal Medicine

## 2022-07-14 MED ORDER — EZETIMIBE 10 MG PO TABS: 10.0000 mg | ORAL_TABLET | Freq: Every day | ORAL | 3 refills | Status: DC

## 2022-07-15 ENCOUNTER — Ambulatory Visit (INDEPENDENT_AMBULATORY_CARE_PROVIDER_SITE_OTHER): Payer: Medicare Other | Admitting: Physical Therapy

## 2022-07-15 ENCOUNTER — Encounter: Payer: Self-pay | Admitting: Physical Therapy

## 2022-07-15 ENCOUNTER — Encounter: Payer: Self-pay | Admitting: Internal Medicine

## 2022-07-15 DIAGNOSIS — R293 Abnormal posture: Secondary | ICD-10-CM

## 2022-07-15 DIAGNOSIS — M79661 Pain in right lower leg: Secondary | ICD-10-CM

## 2022-07-15 DIAGNOSIS — R2681 Unsteadiness on feet: Secondary | ICD-10-CM

## 2022-07-15 DIAGNOSIS — M6281 Muscle weakness (generalized): Secondary | ICD-10-CM

## 2022-07-15 DIAGNOSIS — R2689 Other abnormalities of gait and mobility: Secondary | ICD-10-CM

## 2022-07-15 NOTE — Progress Notes (Signed)
   07/15/22 1225  PT Education  Education Details HEP at [employer]) Educated Patient  Methods Explanation;Demonstration;Tactile cues;Handout;Verbal cues  Comprehension Verbalized understanding;Returned demonstration;Need further instruction

## 2022-07-15 NOTE — Patient Instructions (Signed)

## 2022-07-15 NOTE — Therapy (Signed)
OUTPATIENT PHYSICAL THERAPY TREATMENT NOTE   Patient Name: Eduardo [name] MRN: [id number] DOB:[name], 1956, 66 y.o., male Today's Date: 07/15/2022  PCP: Rita Ohara, MD REFERRING PROVIDER: Newt Minion, MD  END OF SESSION:   PT End of Session - 07/15/22 1140     Visit Number 3    Number of Visits 26    Date for PT Re-Evaluation 09/24/22    Authorization Type [employer] Medicare    Authorization Time Period $20 copay    Progress Note Due on Visit 10    PT Start Time 1141    Equipment Utilized During Treatment Gait belt    Activity Tolerance Patient tolerated treatment well;Patient limited by pain    Behavior During Therapy WFL for tasks assessed/performed              Past Medical History:  Diagnosis Date   CHF (congestive heart failure) ([place])    Chronic kidney disease    stage 3   Colon polyp    Coronary artery disease    Diabetes mellitus 1987   under care of Dr. Chalmers Cater.  On insulin since 96 (off and on)   Diabetic retinopathy    Dupuytren contracture    R hand, s/p injection (Dr. Lenon Curt)   Essential hypertension, benign    Essential hypertension, benign 02/06/2019   Frequency of urination and polyuria    Hypertension    Myocardial infarction [facility])    denies   Neuromuscular disorder ([place])    Diabetic neuropathy   Osteomyelitis ([place])    right foot   Other testicular hypofunction    Peripheral arterial disease ([place]) 10/28/2012   Peritoneal abscess ([place]) 6/08   and buttock.   Pneumonia    Polydipsia    Proteinuria    Pure hyperglyceridemia    Subacute osteomyelitis, right ankle and foot ([place])    Wears glasses    Past Surgical History:  Procedure Laterality Date   ABDOMINAL AORTAGRAM N/A 04/18/2012   Procedure: ABDOMINAL Maxcine Ham;  Surgeon: Angelia Mould, MD;  Location: [facility] CATH LAB;  Service: Cardiovascular;  Laterality: N/A;   AMPUTATION Right 05/19/2019   Procedure: RIGHT FOOT 5TH RAY AMPUTATION;  Surgeon: Newt Minion, MD;  Location: [place];  Service:  Orthopedics;  Laterality: Right;   AMPUTATION Right 03/11/2022   Procedure: RIGHT LEG DEBRIDEMENT VS. BELOW KNEE AMPUTATION;  Surgeon: Newt Minion, MD;  Location: [place];  Service: Orthopedics;  Laterality: Right;   CARDIAC CATHETERIZATION N/A 09/08/2016   Procedure: Left Heart Cath and Coronary Angiography;  Surgeon: Adrian Prows, MD;  Location: [place] CV LAB;  Service: Cardiovascular;  Laterality: N/A;   CATARACT EXTRACTION, BILATERAL  09/2017, 10/2017   Dr. Herbert Deaner   COLONOSCOPY W/ BIOPSIES AND POLYPECTOMY     CORONARY/GRAFT ACUTE MI REVASCULARIZATION N/A 10/11/2020   Procedure: Coronary/Graft Acute MI Revascularization;  Surgeon: Adrian Prows, MD;  Location: [place] CV LAB;  Service: Cardiovascular;  Laterality: N/A;   I & D EXTREMITY Right 03/11/2022   Procedure: BELOW KNEE AMPUTATION;  Surgeon: Newt Minion, MD;  Location: [place];  Service: Orthopedics;  Laterality: Right;   LEFT HEART CATH N/A 12/31/2020   Procedure: Left Heart Cath;  Surgeon: Adrian Prows, MD;  Location: [place] CV LAB;  Service: Cardiovascular;  Laterality: N/A;   LEFT HEART CATH AND CORONARY ANGIOGRAPHY N/A 10/11/2020   Procedure: LEFT HEART CATH AND CORONARY ANGIOGRAPHY;  Surgeon: Adrian Prows, MD;  Location: [place] CV LAB;  Service: Cardiovascular;  Laterality:  N/A;   LOWER EXTREMITY ANGIOGRAM Bilateral 04/18/2012   Procedure: LOWER EXTREMITY ANGIOGRAM;  Surgeon: Angelia Mould, MD;  Location[facility] CATH LAB;  Service: Cardiovascular;  Laterality: Bilateral;  bilat lower extrem angio   LOWER EXTREMITY ANGIOGRAPHY Bilateral 05/02/2019   Procedure: LOWER EXTREMITY ANGIOGRAPHY;  Surgeon: Adrian Prows, MD;  Location: [place] CV LAB;  Service: Cardiovascular;  Laterality: Bilateral;   LOWER EXTREMITY ANGIOGRAPHY Bilateral 02/28/2019   Procedure: LOWER EXTREMITY ANGIOGRAPHY;  Surgeon: Adrian Prows, MD;  Location: [place] CV LAB;  Service: Cardiovascular;  Laterality: Bilateral;   macular photocoagulation      (eye treatments for diabetic retinopathy)-Dr. Zigmund Daniel   PERIPHERAL VASCULAR INTERVENTION  02/28/2019   Procedure: PERIPHERAL VASCULAR INTERVENTION;  Surgeon: Adrian Prows, MD;  Location: [place] CV LAB;  Service: Cardiovascular;;   TEE WITHOUT CARDIOVERSION N/A 03/18/2022   Procedure: TRANSESOPHAGEAL ECHOCARDIOGRAM (TEE);  Surgeon: Adrian Prows, MD;  Location: [facility] ENDOSCOPY;  Service: Cardiovascular;  Laterality: N/A;   VENTRICULAR ASSIST DEVICE INSERTION N/A 12/31/2020   Procedure: VENTRICULAR ASSIST DEVICE INSERTION;  Surgeon: Adrian Prows, MD;  Location: [place] CV LAB;  Service: Cardiovascular;  Laterality: N/A;   Patient Active Problem List   Diagnosis Date Noted   Chronic right-sided congestive heart failure ([place]) 05/18/2022   Ejection fraction < 50% 04/17/2022   Insomnia    Constipation    S/P BKA (below knee amputation), right ([place]) 03/21/2022   Ischemic cardiomyopathy    AKI (acute kidney injury) ([place]) 03/13/2022   Streptococcal bacteremia 03/13/2022   Hypophosphatemia 03/13/2022   Septic shock ([place]) 22/97/9892   Acute systolic heart failure ([place]) 12/28/2020   Acute pulmonary edema ([place]) 10/11/2020   Non-ST elevation (NSTEMI) myocardial infarction ([place]) 10/11/2020   Acute respiratory distress 10/11/2020   Acute on chronic combined systolic and diastolic CHF (congestive heart failure) ([place]) 10/11/2020   Cardiogenic shock (H[facility])    Partial nontraumatic amputation of foot, right ([place]) 06/21/2019   Osteomyelitis of right foot ([place])    Essential hypertension, benign 02/06/2019   Hypercholesteremia 02/06/2019   Stage 3b chronic kidney disease (CKD) ([place]) 10/06/2017   Proteinuria 10/06/2017   Controlled type 2 diabetes mellitus with hyperglycemia, with long-term current use of insulin ([place]) 10/06/2017   Coronary artery disease involving native coronary artery of native heart without angina pectoris 05/23/2017   Abnormal nuclear stress test 09/06/2016   PAD (peripheral artery  disease) ([place]) 10/28/2012   Morbid obesity with BMI of 40.0-44.9, adult ([place]) 10/13/2012   Diabetes mellitus with ophthalmic complication ([place]) 11/94/1740   Hypertension associated with diabetes ([place]) 10/13/2012   Atherosclerosis of native artery of extremity with intermittent claudication ([place]) 04/05/2012   Adenomatous colon polyp 09/28/2011   Diabetes mellitus ([place]) 09/28/2011   Pure hyperglyceridemia 09/28/2011   Erectile dysfunction 09/28/2011   Diabetic retinopathy ([place]) 09/28/2011    REFERRING DIAG: Z89.511 (ICD-10-CM) - S/P below knee amputation, right  ONSET DATE: 06/25/2022 prosthesis delivery  THERAPY DIAG:  Other abnormalities of gait and mobility  Unsteadiness on feet  Muscle weakness (generalized)  Abnormal posture  Pain in right lower leg  Rationale for Evaluation and Treatment Rehabilitation  PERTINENT HISTORY: combined systolic & diastolic CHF/cardiomyopathy EF 15-20%, DM2, PVD/PAD, CAD, CABG 2017, HTN, HLD, morbid obesity, CKD st3b, diabetic retinopathy  PRECAUTIONS: None  SUBJECTIVE:  He saw [facility] last Thursday who put offset pyramid which helps stand & walk with less discomfort. He is wearing 3-5 hours 2-3 x/day.  He has been adjusting socks.   PAIN:  Are you  having pain?  No   OBJECTIVE: (objective measures completed at initial evaluation unless otherwise dated)  OBJECTIVE:    COGNITION: 06/30/2022  Overall cognitive status: Within functional limits for tasks assessed   POSTURE: 06/30/2022  rounded shoulders, forward head, and weight shift left   LOWER EXTREMITY ROM:   ROM P:passive  A:active Right Eval 06/30/2022 Left eval  Hip flexion      Hip extension      Hip abduction      Hip adduction      Hip internal rotation      Hip external rotation      Knee flexion      Knee extension 0*    Ankle dorsiflexion      Ankle plantarflexion      Ankle inversion      Ankle eversion       (Blank rows = not tested)   LOWER EXTREMITY MMT:    MMT Right Eval 06/30/2022 Left eval  Hip flexion      Hip extension 4/5    Hip abduction 4/5    Hip adduction      Hip internal rotation      Hip external rotation      Knee flexion 4/5    Knee extension 4/5    Ankle dorsiflexion NA    Ankle plantarflexion NA    Ankle inversion NA    Ankle eversion NA    Evaluation testing functionally (Blank rows = not tested)   TRANSFERS: 06/30/2022  Sit to stand: SBA from chairs with armrests using back of LLE against chair to stabilize 06/30/2022   Stand to sit:  SBA using UEs to control descent   GAIT: 06/30/2022 Gait pattern: step to pattern, decreased step length- Left, decreased stance time- Right, decreased stride length, decreased hip/knee flexion- Right, Right hip hike, antalgic, and abducted- Right Distance walked: 100' Assistive device utilized: Environmental consultant - 2 wheeled and TTA prosthesis Level of assistance: SBA  needs cues for gait deviations / no assist physically with RW support Comments: excessive UE weight bearing on RW to limit weight on prosthesis.    FUNCTIONAL TESTs:   [facility]rilee Jansky Balance Scale: 06/30/2022:   20/56   [facility] PT Assessment - 06/30/22 1345                Standardized Balance Assessment    Standardized Balance Assessment Berg Balance Test          Berg Balance Test    Sit to Stand Needs minimal aid to stand or to stabilize     Standing Unsupported Able to stand 2 minutes with supervision     Sitting with Back Unsupported but Feet Supported on Floor or Stool Able to sit safely and securely 2 minutes     Stand to Sit Controls descent by using hands     Transfers Able to transfer safely, definite need of hands     Standing Unsupported with Eyes Closed Unable to keep eyes closed 3 seconds but stays steady     Standing Unsupported with Feet Together Needs help to attain position but able to stand for 30 seconds with feet together     From Standing, Reach Forward with Outstretched Arm Reaches forward but needs supervision      From Standing Position, Pick up Object from Floor Unable to try/needs assist to keep balance     From Standing Position, Turn to Look Behind Over each Shoulder Needs supervision when turning     Turn  360 Degrees Needs assistance while turning     Standing Unsupported, Alternately Place Feet on Step/Stool Needs assistance to keep from falling or unable to try     Standing Unsupported, One Foot in Front Needs help to step but can hold 15 seconds     Standing on One Leg Tries to lift leg/unable to hold 3 seconds but remains standing independently     Total Score 20     Berg comment: BERG  < 36 high risk for falls (close to 100%) 46-51 moderate (>50%)   37-45 significant (>80%) 52-55 lower (> 25%)                   CURRENT PROSTHETIC WEAR ASSESSMENT: 06/30/2022 Patient is dependent with: skin check, residual limb care, prosthetic cleaning, ply sock cleaning, correct ply sock adjustment, proper wear schedule/adjustment, and proper weight-bearing schedule/adjustment Donning prosthesis: SBA Doffing prosthesis: SBA Prosthetic wear tolerance: 2 hours, 2x/day, 5 days since delivery Prosthetic weight bearing tolerance: 5 minutes with residual limb pain 3-4/10 Edema: pitting Residual limb condition:  scab medially 61m and dry scab lateral scar 359m  Good hair growth, normal color & temperature.  Cylindrical shape Prosthetic description: silicon liner with pin lock suspension, total contact socket, dynamic response foot K code/activity level with prosthetic use: Level 3     TODAY'S TREATMENT:  07/15/2022 Prosthetic Training with TTA prosthesis No skin issues noted.  PT recommended increasing wear to 4 hrs on, 2 hrs off, up to 3x/day.  Pt donned prosthesis correctly. PT instructed in fall risk when prosthesis off and creating safe environment to reduce risk of falls.  Using bar stool for ADLs & increasing trunk endurance / strength for upright without UE or back support. Pt verbalized  understanding.  PT demo & instructed in prosthetic gait with cane. Pt ambulated [id number] cane stand alone tip with contact guard initial 20' then supervision.   Neuromuscular Re-education working on standing balance, standing endurance & proprioception with limb / socket interface:   07/15/22 1225  PT Education  Education Details HEP at sink  Person(s) Educated Patient  Methods Explanation;Demonstration;Tactile cues;Handout;Verbal cues  Comprehension Verbalized understanding;Returned demonstration;Need further instruction    Do each exercise 1-2  times per day Do each exercise 5-10 repetitions Hold each exercise for 2 seconds to feel your location  AT S[place]ear Try to find this position when standing still for activities.   USE TAPE ON FLOOR TO MARK THE MIDLINE POSITION which is even with middle of sink.  You also should try to feel with your limb pressure in socket.  You are trying to feel with limb what you used to feel with the bottom of your foot.  Side to Side Shift: Moving your hips only (not shoulders): move weight onto your left leg, HOLD/FEEL pressure in socket.  Move back to equal weight on each leg, HOLD/FEEL pressure in socket. Move weight onto your right leg, HOLD/FEEL pressure in socket. Move back to equal weight on each leg, HOLD/FEEL pressure in socket. Repeat.  Start with both hands on sink, progress to hand on prosthetic side only, then no hands.  Front to Back Shift: Moving your hips only (not shoulders): move your weight forward onto your toes, HOLD/FEEL pressure in socket. Move your weight back to equal Flat Foot on both legs, HOLD/FEEL  pressure in socket. Move your weight back onto your heels, HOLD/FEEL  pressure in socket. Move  your weight back to equal on both legs, HOLD/FEEL  pressure in socket. Repeat.  Start with both hands on sink, progress to hand on prosthetic side only, then no hands.   Moving Cones / Cups: With equal weight on each leg: Hold on with one hand the first time, then progress to no hand supports. Move cups from one side of sink to the other. Place cups ~2" out of your reach, progress to 10" beyond reach.  Place one hand in middle of sink and reach with other hand. Do both arms.  Then hover one hand and move cups with other hand.  Overhead/Upward Reaching: alternated reaching up to top cabinets or ceiling if no cabinets present. Keep equal weight on each leg. Start with one hand support on counter while other hand reaches and progress to no hand support with reaching.  ace one hand in middle of sink and reach with other hand. Do both arms.  Then hover one hand and move cups with other hand.  5.   Looking Over Shoulders: With equal weight on each leg: alternate turning to look over your shoulders with one hand support on counter as needed.  Start with head motions only to look in front of shoulder, then even with shoulder and progress to looking behind you. To look to side, move head /eyes, then shoulder on side looking pulls back, shift more weight to side looking and pull hip back. Place one hand in middle of sink and let go with other hand so your shoulder can pull back. Switch hands to look other way.   Then hover one hand and look over shoulder. If looking right, use left hand at sink. If looking left, use right hand at sink. 6.  Stepping with leg that is not amputated:  Move items under cabinet out of your way. Shift your hips/pelvis so weight on prosthesis. Tighten muscles in hip on prosthetic side.  SLOWLY step other leg so front of foot is in cabinet. Then step back to floor.   07/07/2022 Prosthetic Training with TTA prosthesis PT educated pt in adjusting ply socks with too few, too many & correct ply. He verbalized better understanding. PT verbal cues on signs of sweating & need to dry limb / liner. Pt verbalized understanding.  PT increased wear to 3 hours on & 2  hours off throughout his awake hours and educated on rationale to progressing to all awake hours. Pt verbalized understanding.  PT instructed in Increasing your activity level is important. -Short distances which is walking from one room to another. Work to increase frequency back to prior level. -Medium distances are entering & exiting your home or community with limited distances. Start with 4 medium walks which is one outing to one location and increase number of tolerated amounts per day. -Long distance is your highest tolerance for you. Walk until you feel you must rest. Back or leg pain or general fatigue are indicators to maximum tolerance. Monitor by distance or time. Try to walk your BEST distance 1-2 times per day. You should see this increase over time.  Pt verbalized understanding.  PT instructed with verbal & demo on rollator walker safety. Pt verbalized & return demo understanding including sit to/from stand from seat. PT instructed in neg ramp & curb with std RW & rollator. Pt return demo understanding.  PT demo & verbal cues on picking up item from floor. Pt return demo 3 reps & verbalized safe set-up to practice.  PT demo  simulated casting / fishing with offset feet. Pt verbalized & return demo understanding.   06/30/2022 PATIENT EDUCATION: PATIENT EDUCATED ON FOLLOWING PROSTHETIC CARE: Education details:   Skin check, Residual limb care, Prosthetic cleaning, Correct ply sock adjustment, [place] donning, and Proper wear schedule/adjustment Prosthetic wear tolerance: 2 hours 2-4/day, off 2hrs between wear including liner, 7 days/week Person educated: Patient Education method: Explanation, Demonstration, Tactile cues, and Verbal cues Education comprehension: verbalized understanding, verbal cues required, tactile cues required, and needs further education     HOME EXERCISE PROGRAM:     ASSESSMENT:   CLINICAL IMPRESSION: Patient appears to understand HEP including rationale.   He is tolerating increased wear of prosthesis.     OBJECTIVE IMPAIRMENTS Abnormal gait, cardiopulmonary status limiting activity, decreased activity tolerance, decreased balance, decreased endurance, decreased knowledge of condition, decreased knowledge of use of DME, decreased mobility, difficulty walking, decreased strength, increased edema, postural dysfunction, prosthetic dependency , obesity, pain, and skin integrity issues on residual limb .    ACTIVITY LIMITATIONS carrying, lifting, bending, sitting, standing, squatting, stairs, transfers, locomotion level, and safe prosthesis use   PARTICIPATION LIMITATIONS: driving, community activity, and recreational activities   [place], Time since onset of injury/illness/exacerbation, and 3+ comorbidities: see PMH  are also affecting patient's functional outcome.    REHAB POTENTIAL: Good   CLINICAL DECISION MAKING: Evolving/moderate complexity   EVALUATION COMPLEXITY: Moderate     GOALS: Goals reviewed with patient? Yes   SHORT TERM GOALS: Target date: 07/29/2022   Patient donnes prosthesis modified independent & verbalizes proper cleaning. Baseline: SEE OBJECTIVE DATA Goal status: INITIAL 2.  Patient tolerates prosthesis >12 hrs total /day without increase in skin issues or limb pain </= 2/10 after standing. Baseline: SEE OBJECTIVE DATA Goal status: INITIAL   3.  Patient able to reach 10", pick up items from floor and look over both shoulders without UE support with supervision. Baseline: SEE OBJECTIVE DATA Goal status: INITIAL   4. Patient ambulates 200' with cane & prosthesis with supervision. Baseline: SEE OBJECTIVE DATA Goal status: INITIAL   5. Patient negotiates ramps & curbs with cane & prosthesis with minA. Baseline: SEE OBJECTIVE DATA Goal status: INITIAL     LONG TERM GOALS: Target date: 09/24/2022   Patient demonstrates & verbalized understanding of prosthetic care to enable safe utilization of  prosthesis. Baseline: SEE OBJECTIVE DATA Goal status: INITIAL   Patient tolerates prosthesis wear >90% of awake hours without skin or limb pain issues. Baseline: SEE OBJECTIVE DATA Goal status: INITIAL   Berg Balance >/= 40/56  to indicate lower fall risk Baseline: SEE OBJECTIVE DATA Goal status: INITIAL   Patient ambulates >500' with prosthesis & cane or less independently Baseline: SEE OBJECTIVE DATA Goal status: INITIAL   Patient negotiates ramps, curbs & stairs with single rail with prosthesis cane or less independently. Baseline: SEE OBJECTIVE DATA Goal status: INITIAL   Patient demonstrates & verbalizes climbing ladder, lifting, carrying, pushing, pulling with prosthesis safely. Baseline: SEE OBJECTIVE DATA Goal status: INITIAL   Patient verbalizes & demonstrates understanding of fishing simulations.  Baseline: SEE OBJECTIVE DATA Goal status: INITIAL   PLAN: PT FREQUENCY: 2x/week   PT DURATION: 90 days / 13 weeks   PLANNED INTERVENTIONS: Therapeutic exercises, Therapeutic activity, Neuromuscular re-education, Balance training, Gait training, Patient/Family education, Stair training, Vestibular training, Prosthetic training, DME instructions, and Physical Performance Testing   PLAN FOR NEXT SESSION:  verbally check HEP at sink, check residual limb, prosthetic gait with cane including ramp & curb, standing balance  including compliant surfaces.   Jamey Reas, PT, DPT 07/15/2022, 11:44 AM

## 2022-07-16 ENCOUNTER — Ambulatory Visit (INDEPENDENT_AMBULATORY_CARE_PROVIDER_SITE_OTHER): Payer: Medicare Other

## 2022-07-16 ENCOUNTER — Ambulatory Visit: Payer: Medicare Other | Admitting: Orthopedic Surgery

## 2022-07-16 ENCOUNTER — Encounter: Payer: Medicare Other | Admitting: Physical Therapy

## 2022-07-16 DIAGNOSIS — Z89511 Acquired absence of right leg below knee: Secondary | ICD-10-CM

## 2022-07-17 ENCOUNTER — Encounter: Payer: Self-pay | Admitting: Orthopedic Surgery

## 2022-07-17 NOTE — Progress Notes (Signed)
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  He is doing therapy 2 times a week.  He was fit for his prosthesis in 3 months.  Assessment & Plan: Visit Diagnoses:  1. S/P below knee amputation, right ([place])     Plan: Continue with therapy increasing activities as tolerated reevaluate in 3 months with 2 view radiographs of the right leg  Follow-Up Instructions: Return in about 3 months (around 10/15/2022).   Ortho Exam  Patient is alert, oriented, no adenopathy, well-dressed, normal affect, normal respiratory effort. Examination the residual limb is well-healed and consolidated no redness no cellulitis no open wounds.  Imaging: No results found. No images are attached to the encounter.  Labs: Lab Results  Component Value Date   HGBA1C 6.6 06/23/2022   HGBA1C 7.3 (H) 03/11/2022   HGBA1C 6.4 12/23/2021   REPTSTATUS 03/17/2022 FINAL 03/12/2022   GRAMSTAIN  09/16/2021    FEW WBC PRESENT, PREDOMINANTLY MONONUCLEAR RARE GRAM POSITIVE COCCI FEW GRAM NEGATIVE RODS    CULT  03/12/2022    NO GROWTH 5 DAYS Performed at [facility], [place] [address] [place],  [address]    LABORGA STREPTOCOCCUS ANGINOSIS 03/08/2022     Lab Results  Component Value Date   ALBUMIN 3.6 (L) 04/08/2022   ALBUMIN 2.6 (L) 03/23/2022   ALBUMIN 2.1 (L) 03/11/2022   PREALBUMIN 6.5 (L) 03/11/2022    Lab Results  Component Value Date   MG 2.2 03/11/2022   MG 2.2 03/10/2022   MG 2.0 03/09/2022   Lab Results  Component Value Date   VD25OH 75.05 03/11/2022   VD25OH 54.2 12/05/2021    Lab  Results  Component Value Date   PREALBUMIN 6.5 (L) 03/11/2022      Latest Ref Rng & Units 04/17/2022   11:35 AM 04/08/2022    1:26 PM 03/30/2022   12:35 AM  CBC EXTENDED  WBC 3.4 - 10.8 x10E3/uL 6.3  5.7  5.5   RBC 4.14 - 5.80 x10E6/uL 3.60  3.91  3.87   Hemoglobin 13.0 - 17.7 g/dL 11.0  11.8  11.8   HCT 37.5 - 51.0 % 33.4  35.6  35.3   Platelets 150 - 450 x10E3/uL 236  196  207   NEUT# 1.4 - 7.0 x10E3/uL 4.3  3.3    Lymph# 0.7 - 3.1 x10E3/uL 1.3  1.5       There is no height or weight on file to calculate BMI.  Orders:  Orders Placed This Encounter  Procedures   XR Tibia/Fibula Right   No orders of the defined types were placed in this encounter.    Procedures: No procedures performed  Clinical Data: No additional findings.  ROS:  All other systems negative, except as noted in the HPI. Review of Systems  Objective: Vital Signs: There were no vitals taken for this visit.  Specialty Comments:  No specialty comments available.  [place]  History: Patient Active Problem List   Diagnosis Date Noted   Chronic right-sided congestive heart failure ([place]) 05/18/2022   Ejection fraction < 50% 04/17/2022   Insomnia    Constipation    S/P BKA (below knee amputation), right ([place]) 03/21/2022   Ischemic cardiomyopathy    AKI (acute kidney injury) ([place]) 03/13/2022   Streptococcal bacteremia 03/13/2022   Hypophosphatemia 03/13/2022   Septic shock ([place]) 54/07/8118   Acute systolic heart failure ([place]) 12/28/2020   Acute pulmonary edema ([place]) 10/11/2020   Non-ST elevation (NSTEMI) myocardial infarction ([place]) 10/11/2020   Acute respiratory distress 10/11/2020   Acute on chronic combined systolic and diastolic CHF (congestive heart failure) ([place]) 10/11/2020   Cardiogenic shock (HCC)    Partial nontraumatic amputation of foot, right ([place]) 06/21/2019   Osteomyelitis of right foot ([place])    Essential hypertension, benign 02/06/2019   Hypercholesteremia 02/06/2019   Stage 3b chronic  kidney disease (CKD) ([place]) 10/06/2017   Proteinuria 10/06/2017   Controlled type 2 diabetes mellitus with hyperglycemia, with long-term current use of insulin ([place]) 10/06/2017   Coronary artery disease involving native coronary artery of native heart without angina pectoris 05/23/2017   Abnormal nuclear stress test 09/06/2016   PAD (peripheral artery disease) ([place]) 10/28/2012   Morbid obesity with BMI of 40.0-44.9, adult ([place]) 10/13/2012   Diabetes mellitus with ophthalmic complication ([place]) 14/78/2956   Hypertension associated with diabetes ([place]) 10/13/2012   Atherosclerosis of native artery of extremity with intermittent claudication ([place]) 04/05/2012   Adenomatous colon polyp 09/28/2011   Diabetes mellitus ([place]) 09/28/2011   Pure hyperglyceridemia 09/28/2011   Erectile dysfunction 09/28/2011   Diabetic retinopathy ([place]) 09/28/2011   Past Medical History:  Diagnosis Date   CHF (congestive heart failure) ([place])    Chronic kidney disease    stage 3   Colon polyp    Coronary artery disease    Diabetes mellitus 1987   under care of Dr. Chalmers Cater.  On insulin since 96 (off and on)   Diabetic retinopathy    Dupuytren contracture    R hand, s/p injection (Dr. Lenon Curt)   Essential hypertension, benign    Essential hypertension, benign 02/06/2019   Frequency of urination and polyuria    Hypertension    Myocardial infarction [facility])    denies   Neuromuscular disorder ([place])    Diabetic neuropathy   Osteomyelitis ([place])    right foot   Other testicular hypofunction    Peripheral arterial disease ([place]) 10/28/2012   Peritoneal abscess ([place]) 6/08   and buttock.   Pneumonia    Polydipsia    Proteinuria    Pure hyperglyceridemia    Subacute osteomyelitis, right ankle and foot (HCC)    Wears glasses     Family History  Problem Relation Age of Onset   Diabetes Mother    Hearing loss Mother    Hypertension Mother    Hyperlipidemia Mother    Heart disease Mother    Varicose Veins Mother     Varicose Veins Father    Dementia Father    Hyperlipidemia Brother    Diabetes Maternal Grandmother     Past Surgical History:  Procedure Laterality Date   ABDOMINAL AORTAGRAM N/A 04/18/2012   Procedure: ABDOMINAL Maxcine Ham;  Surgeon: Angelia Mould, MD;  Location: [facility] CATH LAB;  Service: Cardiovascular;  Laterality: N/A;   AMPUTATION Right 05/19/2019   Procedure: RIGHT FOOT 5TH RAY AMPUTATION;  Surgeon: Newt Minion, MD;  Location: [facility];  Service: Orthopedics;  Laterality: Right;  AMPUTATION Right 03/11/2022   Procedure: RIGHT LEG DEBRIDEMENT VS. BELOW KNEE AMPUTATION;  Surgeon: Newt Minion, MD;  Location: [facility];  Service: Orthopedics;  Laterality: Right;   CARDIAC CATHETERIZATION N/A 09/08/2016   Procedure: Left Heart Cath and Coronary Angiography;  Surgeon: Adrian Prows, MD;  Location: [place] CV LAB;  Service: Cardiovascular;  Laterality: N/A;   CATARACT EXTRACTION, BILATERAL  09/2017, 10/2017   Dr. Herbert Deaner   COLONOSCOPY W/ BIOPSIES AND POLYPECTOMY     CORONARY/GRAFT ACUTE MI REVASCULARIZATION N/A 10/11/2020   Procedure: Coronary/Graft Acute MI Revascularization;  Surgeon: Adrian Prows, MD;  Location: [place] CV LAB;  Service: Cardiovascular;  Laterality: N/A;   I & D EXTREMITY Right 03/11/2022   Procedure: BELOW KNEE AMPUTATION;  Surgeon: Newt Minion, MD;  Location: [facility];  Service: Orthopedics;  Laterality: Right;   LEFT HEART CATH N/A 12/31/2020   Procedure: Left Heart Cath;  Surgeon: Adrian Prows, MD;  Location: [place] [facility] LAB;  Service: Cardiovascular;  Laterality: N/A;   LEFT HEART CATH AND CORONARY ANGIOGRAPHY N/A 10/11/2020   Procedure: LEFT HEART CATH AND CORONARY ANGIOGRAPHY;  Surgeon: Adrian Prows, MD;  Location: [place] [facility] LAB;  Service: Cardiovascular;  Laterality: N/A;   LOWER EXTREMITY ANGIOGRAM Bilateral 04/18/2012   Procedure: LOWER EXTREMITY ANGIOGRAM;  Surgeon: Angelia Mould, MD;  Location: [facility] CATH LAB;  Service: Cardiovascular;  Laterality:  Bilateral;  bilat lower extrem angio   LOWER EXTREMITY ANGIOGRAPHY Bilateral 05/02/2019   Procedure: LOWER EXTREMITY ANGIOGRAPHY;  Surgeon: Adrian Prows, MD;  Location: [place] [facility] LAB;  Service: Cardiovascular;  Laterality: Bilateral;   LOWER EXTREMITY ANGIOGRAPHY Bilateral 02/28/2019   Procedure: LOWER EXTREMITY ANGIOGRAPHY;  Surgeon: Adrian Prows, MD;  Location: [place] [facility] LAB;  Service: Cardiovascular;  Laterality: Bilateral;   macular photocoagulation     (eye treatments for diabetic retinopathy)-Dr. Zigmund Daniel   PERIPHERAL VASCULAR INTERVENTION  02/28/2019   Procedure: PERIPHERAL VASCULAR INTERVENTION;  Surgeon: Adrian Prows, MD;  Location: [place] CV LAB;  Service: Cardiovascular;;   TEE WITHOUT CARDIOVERSION N/A 03/18/2022   Procedure: TRANSESOPHAGEAL ECHOCARDIOGRAM (TEE);  Surgeon: Adrian Prows, MD;  Location[facility] ENDOSCOPY;  Service: Cardiovascular;  Laterality: N/A;   VENTRICULAR ASSIST DEVICE INSERTION N/A 12/31/2020   Procedure: VENTRICULAR ASSIST DEVICE INSERTION;  Surgeon: Adrian Prows, MD;  Location: [place] [facility] LAB;  Service: Cardiovascular;  Laterality: N/A;   Social History   Occupational History   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

## 2022-07-19 DIAGNOSIS — E1165 Type 2 diabetes mellitus with hyperglycemia: Secondary | ICD-10-CM | POA: Diagnosis not present

## 2022-07-20 ENCOUNTER — Encounter: Payer: Self-pay | Admitting: Physical Therapy

## 2022-07-20 ENCOUNTER — Ambulatory Visit (INDEPENDENT_AMBULATORY_CARE_PROVIDER_SITE_OTHER): Payer: Medicare Other | Admitting: Physical Therapy

## 2022-07-20 DIAGNOSIS — M6281 Muscle weakness (generalized): Secondary | ICD-10-CM | POA: Diagnosis not present

## 2022-07-20 DIAGNOSIS — R2681 Unsteadiness on feet: Secondary | ICD-10-CM

## 2022-07-20 DIAGNOSIS — R2689 Other abnormalities of gait and mobility: Secondary | ICD-10-CM | POA: Diagnosis not present

## 2022-07-20 DIAGNOSIS — M79661 Pain in right lower leg: Secondary | ICD-10-CM | POA: Diagnosis not present

## 2022-07-20 DIAGNOSIS — R293 Abnormal posture: Secondary | ICD-10-CM

## 2022-07-20 NOTE — Therapy (Signed)
OUTPATIENT PHYSICAL THERAPY TREATMENT NOTE   Patient Name: Eduardo [name] MRN: [id number] DOB:Nov 22, 1955, 66 y.o., male Today's Date: 07/20/2022  PCP: Rita Ohara, MD REFERRING PROVIDER: Newt Minion, MD  END OF SESSION:   PT End of Session - 07/20/22 1016     Visit Number 4    Number of Visits 26    Date for PT Re-Evaluation 09/24/22    Authorization Type [employer] Medicare    Authorization Time Period $20 copay    Progress Note Due on Visit 10    PT Start Time 1014    PT Stop Time 1104    PT Time Calculation (min) 50 min    Equipment Utilized During Treatment Gait belt    Activity Tolerance Patient tolerated treatment well;Patient limited by pain    Behavior During Therapy WFL for tasks assessed/performed             Past Medical History:  Diagnosis Date   CHF (congestive heart failure) ([place])    Chronic kidney disease    stage 3   Colon polyp    Coronary artery disease    Diabetes mellitus 1987   under care of Dr. Chalmers Cater.  On insulin since 96 (off and on)   Diabetic retinopathy    Dupuytren contracture    R hand, s/p injection (Dr. Lenon Curt)   Essential hypertension, benign    Essential hypertension, benign 02/06/2019   Frequency of urination and polyuria    Hypertension    Myocardial infarction [facility])    denies   Neuromuscular disorder ([place])    Diabetic neuropathy   Osteomyelitis ([place])    right foot   Other testicular hypofunction    Peripheral arterial disease ([place]) 10/28/2012   Peritoneal abscess ([place]) 6/08   and buttock.   Pneumonia    Polydipsia    Proteinuria    Pure hyperglyceridemia    Subacute osteomyelitis, right ankle and foot ([place])    Wears glasses    Past Surgical History:  Procedure Laterality Date   ABDOMINAL AORTAGRAM N/A 04/18/2012   Procedure: ABDOMINAL Maxcine Ham;  Surgeon: Angelia Mould, MD;  Location: [facility] CATH LAB;  Service: Cardiovascular;  Laterality: N/A;   AMPUTATION Right 05/19/2019   Procedure: RIGHT FOOT 5TH RAY AMPUTATION;   Surgeon: Newt Minion, MD;  Location: [place];  Service: Orthopedics;  Laterality: Right;   AMPUTATION Right 03/11/2022   Procedure: RIGHT LEG DEBRIDEMENT VS. BELOW KNEE AMPUTATION;  Surgeon: Newt Minion, MD;  Location: [place];  Service: Orthopedics;  Laterality: Right;   CARDIAC CATHETERIZATION N/A 09/08/2016   Procedure: Left Heart Cath and Coronary Angiography;  Surgeon: Adrian Prows, MD;  Location: [place] CV LAB;  Service: Cardiovascular;  Laterality: N/A;   CATARACT EXTRACTION, BILATERAL  09/2017, 10/2017   Dr. Herbert Deaner   COLONOSCOPY W/ BIOPSIES AND POLYPECTOMY     CORONARY/GRAFT ACUTE MI REVASCULARIZATION N/A 10/11/2020   Procedure: Coronary/Graft Acute MI Revascularization;  Surgeon: Adrian Prows, MD;  Location: [place] CV LAB;  Service: Cardiovascular;  Laterality: N/A;   I & D EXTREMITY Right 03/11/2022   Procedure: BELOW KNEE AMPUTATION;  Surgeon: Newt Minion, MD;  Location: [place];  Service: Orthopedics;  Laterality: Right;   LEFT HEART CATH N/A 12/31/2020   Procedure: Left Heart Cath;  Surgeon: Adrian Prows, MD;  Location: [place] CV LAB;  Service: Cardiovascular;  Laterality: N/A;   LEFT HEART CATH AND CORONARY ANGIOGRAPHY N/A 10/11/2020   Procedure: LEFT HEART CATH AND CORONARY ANGIOGRAPHY;  Surgeon: Adrian Prows, MD;  Location: [place] CV LAB;  Service: Cardiovascular;  Laterality: N/A;   LOWER EXTREMITY ANGIOGRAM Bilateral 04/18/2012   Procedure: LOWER EXTREMITY ANGIOGRAM;  Surgeon: Angelia Mould, MD;  Location: [facility] CATH LAB;  Service: Cardiovascular;  Laterality: Bilateral;  bilat lower extrem angio   LOWER EXTREMITY ANGIOGRAPHY Bilateral 05/02/2019   Procedure: LOWER EXTREMITY ANGIOGRAPHY;  Surgeon: Adrian Prows, MD;  Location: [place] CV LAB;  Service: Cardiovascular;  Laterality: Bilateral;   LOWER EXTREMITY ANGIOGRAPHY Bilateral 02/28/2019   Procedure: LOWER EXTREMITY ANGIOGRAPHY;  Surgeon: Adrian Prows, MD;  Location: [place] CV LAB;  Service: Cardiovascular;   Laterality: Bilateral;   macular photocoagulation     (eye treatments for diabetic retinopathy)-Dr. Zigmund Daniel   PERIPHERAL VASCULAR INTERVENTION  02/28/2019   Procedure: PERIPHERAL VASCULAR INTERVENTION;  Surgeon: Adrian Prows, MD;  Location: [place] CV LAB;  Service: Cardiovascular;;   TEE WITHOUT CARDIOVERSION N/A 03/18/2022   Procedure: TRANSESOPHAGEAL ECHOCARDIOGRAM (TEE);  Surgeon: Adrian Prows, MD;  Location: [facility] ENDOSCOPY;  Service: Cardiovascular;  Laterality: N/A;   VENTRICULAR ASSIST DEVICE INSERTION N/A 12/31/2020   Procedure: VENTRICULAR ASSIST DEVICE INSERTION;  Surgeon: Adrian Prows, MD;  Location: [place] CV LAB;  Service: Cardiovascular;  Laterality: N/A;   Patient Active Problem List   Diagnosis Date Noted   Chronic right-sided congestive heart failure ([place]) 05/18/2022   Ejection fraction < 50% 04/17/2022   Insomnia    Constipation    S/P BKA (below knee amputation), right ([place]) 03/21/2022   Ischemic cardiomyopathy    AKI (acute kidney injury) ([place]) 03/13/2022   Streptococcal bacteremia 03/13/2022   Hypophosphatemia 03/13/2022   Septic shock ([place]) 16/08/9603   Acute systolic heart failure ([place]) 12/28/2020   Acute pulmonary edema ([place]) 10/11/2020   Non-ST elevation (NSTEMI) myocardial infarction ([place]) 10/11/2020   Acute respiratory distress 10/11/2020   Acute on chronic combined systolic and diastolic CHF (congestive heart failure) ([place]) 10/11/2020   Cardiogenic shock (HCC)    Partial nontraumatic amputation of foot, right ([place]) 06/21/2019   Osteomyelitis of right foot ([place])    Essential hypertension, benign 02/06/2019   Hypercholesteremia 02/06/2019   Stage 3b chronic kidney disease (CKD) ([place]) 10/06/2017   Proteinuria 10/06/2017   Controlled type 2 diabetes mellitus with hyperglycemia, with long-term current use of insulin ([place]) 10/06/2017   Coronary artery disease involving native coronary artery of native heart without angina pectoris 05/23/2017   Abnormal nuclear  stress test 09/06/2016   PAD (peripheral artery disease) ([place]) 10/28/2012   Morbid obesity with BMI of 40.0-44.9, adult ([place]) 10/13/2012   Diabetes mellitus with ophthalmic complication ([place]) 54/07/8118   Hypertension associated with diabetes ([place]) 10/13/2012   Atherosclerosis of native artery of extremity with intermittent claudication ([place]) 04/05/2012   Adenomatous colon polyp 09/28/2011   Diabetes mellitus ([place]) 09/28/2011   Pure hyperglyceridemia 09/28/2011   Erectile dysfunction 09/28/2011   Diabetic retinopathy ([place]) 09/28/2011    REFERRING DIAG: Z89.511 (ICD-10-CM) - S/P below knee amputation, right  ONSET DATE: 06/25/2022 prosthesis delivery  THERAPY DIAG:  Other abnormalities of gait and mobility  Unsteadiness on feet  Muscle weakness (generalized)  Abnormal posture  Pain in right lower leg  Rationale for Evaluation and Treatment Rehabilitation  PERTINENT HISTORY: combined systolic & diastolic CHF/cardiomyopathy EF 15-20%, DM2, PVD/PAD, CAD, CABG 2017, HTN, HLD, morbid obesity, CKD st3b, diabetic retinopathy  PRECAUTIONS: None  SUBJECTIVE: He has been walking more with the cane in the house, with no falls or near-falls. He has tried to increase standing  at home. He has been adjusting socks. He wears the prosthesis 4-5 hours 3x/day with a 1.5-2 hour break in between, working towards wearing it all the time. He is interested in biking with his Peloton and doing yoga and exercises on a yoga mat at home.  PAIN:  Are you having pain?  No   OBJECTIVE: (objective measures completed at initial evaluation unless otherwise dated)  OBJECTIVE:    COGNITION: 06/30/2022  Overall cognitive status: Within functional limits for tasks assessed   POSTURE: 06/30/2022  rounded shoulders, forward head, and weight shift left   LOWER EXTREMITY ROM:   ROM P:passive  A:active Right Eval 06/30/2022 Left eval  Hip flexion      Hip extension      Hip abduction      Hip adduction       Hip internal rotation      Hip external rotation      Knee flexion      Knee extension 0*    Ankle dorsiflexion      Ankle plantarflexion      Ankle inversion      Ankle eversion       (Blank rows = not tested)   LOWER EXTREMITY MMT:   MMT Right Eval 06/30/2022 Left eval  Hip flexion      Hip extension 4/5    Hip abduction 4/5    Hip adduction      Hip internal rotation      Hip external rotation      Knee flexion 4/5    Knee extension 4/5    Ankle dorsiflexion NA    Ankle plantarflexion NA    Ankle inversion NA    Ankle eversion NA    Evaluation testing functionally (Blank rows = not tested)   TRANSFERS: 06/30/2022  Sit to stand: SBA from chairs with armrests using back of LLE against chair to stabilize 06/30/2022   Stand to sit:  SBA using UEs to control descent   GAIT: 07/20/2022 Pt ambulates with step-through pattern with RW and Rt medial whip  06/30/2022 Gait pattern: step to pattern, decreased step length- Left, decreased stance time- Right, decreased stride length, decreased hip/knee flexion- Right, Right hip hike, antalgic, and abducted- Right Distance walked: 100' Assistive device utilized: Environmental consultant - 2 wheeled and TTA prosthesis Level of assistance: SBA  needs cues for gait deviations / no assist physically with RW support Comments: excessive UE weight bearing on RW to limit weight on prosthesis.    FUNCTIONAL TESTs:   Merrilee Jansky Balance Scale: 06/30/2022:   20/56   [facility] PT Assessment - 06/30/22 1345                Standardized Balance Assessment    Standardized Balance Assessment Berg Balance Test          Berg Balance Test    Sit to Stand Needs minimal aid to stand or to stabilize     Standing Unsupported Able to stand 2 minutes with supervision     Sitting with Back Unsupported but Feet Supported on Floor or Stool Able to sit safely and securely 2 minutes     Stand to Sit Controls descent by using hands     Transfers Able to transfer safely, definite need  of hands     Standing Unsupported with Eyes Closed Unable to keep eyes closed 3 seconds but stays steady     Standing Unsupported with Feet Together Needs help to attain position but able to  stand for 30 seconds with feet together     From Standing, Reach Forward with Outstretched Arm Reaches forward but needs supervision     From Standing Position, Pick up Object from Floor Unable to try/needs assist to keep balance     From Standing Position, Turn to Look Behind Over each Shoulder Needs supervision when turning     Turn 360 Degrees Needs assistance while turning     Standing Unsupported, Alternately Place Feet on Step/Stool Needs assistance to keep from falling or unable to try     Standing Unsupported, One Foot in [place] help to step but can hold 15 seconds     Standing on One Leg Tries to lift leg/unable to hold 3 seconds but remains standing independently     Total Score 20     Berg comment: BERG  < 36 high risk for falls (close to 100%) 46-51 moderate (>50%)   37-45 significant (>80%) 52-55 lower (> 25%)                   CURRENT PROSTHETIC WEAR ASSESSMENT: 06/30/2022 Patient is dependent with: skin check, residual limb care, prosthetic cleaning, ply sock cleaning, correct ply sock adjustment, proper wear schedule/adjustment, and proper weight-bearing schedule/adjustment Donning prosthesis: SBA Doffing prosthesis: SBA Prosthetic wear tolerance: 2 hours, 2x/day, 5 days since delivery Prosthetic weight bearing tolerance: 5 minutes with residual limb pain 3-4/10 Edema: pitting Residual limb condition:  scab medially 70m and dry scab lateral scar 3681m  Good hair growth, normal color & temperature.  Cylindrical shape Prosthetic description: silicon liner with pin lock suspension, total contact socket, dynamic response foot K code/activity level with prosthetic use: Level 3     TODAY'S TREATMENT:  07/20/2022 Prosthetic Training with TTA prosthesis ~[id number] blister noted on  distal tibia and pt notes it has been there since wound vac. PT educated on sweat management for skin integrity and antiperspirant use, and pt verbalized understanding. PT recommended wear time 4 hrs on, 1 hr off, 3x/day Pt donned prosthesis correctly. PT demo floor transfer with chair support as pt is interested in seated/supine yoga and pt returned demo x 3 with standing to half kneeling to kneeling to seated with SBA and ability to stabilize without use of cane upon standing PT instructed in getting on and off [place] bike with advice in set up for use with prosthesis, pt verbalized understanding and used recumbent bike 2 min seat 7 Pt amb 50' with standalone cane LUE with SBA, then 100' with cane with SBA including curb negotiation with minA for recover loss of balance during ambulation. Pt ascended and descended curb x 2 with minA following PT demo of technique including using momentum  TherEx: Seated quad set RLE with hip ER to target VMO x 3 with 5s hold Seated SLR RLE with hip ER to target VMO x 10    07/20/22 1506  PT Education  Education Details Access Code: [email or web address]) Educated Patient  Methods Explanation;Demonstration;Tactile cues;Verbal cues;Handout  Comprehension Verbalized understanding;Returned demonstration;Need further instruction    Access Code: [email or web address]: [email or web address] Date: 07/20/2022 Prepared by: RobJamey Reasxercises - Seated Quad Set  - 3-5 x daily - 7 x weekly - 2-3 sets - 10-20 reps - 5 seconds hold - Straight Leg Raise with External Rotation  - 3-5 x daily - 7 x weekly - 2-3 sets - 10-20 reps - 5 seconds hold  07/15/2022 Prosthetic Training with TTA prosthesis No skin issues noted.  PT recommended increasing wear to 4 hrs on, 2 hrs off, up to 3x/day.  Pt donned prosthesis correctly. PT instructed in fall risk when prosthesis off and creating safe environment to reduce risk of falls.  Using bar stool for ADLs & increasing  trunk endurance / strength for upright without UE or back support. Pt verbalized understanding.  PT demo & instructed in prosthetic gait with cane. Pt ambulated 96' with cane stand alone tip with contact guard initial 20' then supervision.   Neuromuscular Re-education working on standing balance, standing endurance & proprioception with limb / socket interface:   07/15/22 1225  PT Education  Education Details HEP at sink  Person(s) Educated Patient  Methods Explanation;Demonstration;Tactile cues;Handout;Verbal cues  Comprehension Verbalized understanding;Returned demonstration;Need further instruction    Do each exercise 1-2  times per day Do each exercise 5-10 repetitions Hold each exercise for 2 seconds to feel your location  AT [place].  Try to find this position when standing still for activities.   USE TAPE ON FLOOR TO MARK THE MIDLINE POSITION which is even with middle of sink.  You also should try to feel with your limb pressure in socket.  You are trying to feel with limb what you used to feel with the bottom of your foot.  Side to Side Shift: Moving your hips only (not shoulders): move weight onto your left leg, HOLD/FEEL pressure in socket.  Move back to equal weight on each leg, HOLD/FEEL pressure in socket. Move weight onto your right leg, HOLD/FEEL pressure in socket. Move back to equal weight on each leg, HOLD/FEEL pressure in socket. Repeat.  Start with both hands on sink, progress to hand on prosthetic side only, then no hands.  Front to Back Shift: Moving your hips only (not shoulders): move your weight forward onto your toes, HOLD/FEEL pressure in socket. Move your weight back to equal Flat Foot on both legs, HOLD/FEEL  pressure in socket. Move your weight back onto your heels, HOLD/FEEL  pressure in socket. Move your weight back to equal on both legs, HOLD/FEEL  pressure in socket. Repeat.  Start with  both hands on sink, progress to hand on prosthetic side only, then no hands.  Moving Cones / Cups: With equal weight on each leg: Hold on with one hand the first time, then progress to no hand supports. Move cups from one side of sink to the other. Place cups ~2" out of your reach, progress to 10" beyond reach.  Place one hand in middle of sink and reach with other hand. Do both arms.  Then hover one hand and move cups with other hand.  Overhead/Upward Reaching: alternated reaching up to top cabinets or ceiling if no cabinets present. Keep equal weight on each leg. Start with one hand support on counter while other hand reaches and progress to no hand support with reaching.  ace one hand in middle of sink and reach with other hand. Do both arms.  Then hover one hand and move cups with other hand.  5.   Looking Over Shoulders: With equal weight on each leg: alternate turning to look over your shoulders with one hand support on counter as needed.  Start with head motions only to look in front of shoulder, then even with shoulder and progress to looking behind you. To look to side, move head /eyes, then shoulder on side looking pulls back, shift more weight to side looking  and pull hip back. Place one hand in middle of sink and let go with other hand so your shoulder can pull back. Switch hands to look other way.   Then hover one hand and look over shoulder. If looking right, use left hand at sink. If looking left, use right hand at sink. 6.  Stepping with leg that is not amputated:  Move items under cabinet out of your way. Shift your hips/pelvis so weight on prosthesis. Tighten muscles in hip on prosthetic side.  SLOWLY step other leg so front of foot is in cabinet. Then step back to floor.   07/07/2022 Prosthetic Training with TTA prosthesis PT educated pt in adjusting ply socks with too few, too many & correct ply. He verbalized better understanding. PT verbal cues on signs of sweating & need to dry limb /  liner. Pt verbalized understanding.  PT increased wear to 3 hours on & 2 hours off throughout his awake hours and educated on rationale to progressing to all awake hours. Pt verbalized understanding.  PT instructed in Increasing your activity level is important. -Short distances which is walking from one room to another. Work to increase frequency back to prior level. -Medium distances are entering & exiting your home or community with limited distances. Start with 4 medium walks which is one outing to one location and increase number of tolerated amounts per day. -Long distance is your highest tolerance for you. Walk until you feel you must rest. Back or leg pain or general fatigue are indicators to maximum tolerance. Monitor by distance or time. Try to walk your BEST distance 1-2 times per day. You should see this increase over time.  Pt verbalized understanding.  PT instructed with verbal & demo on rollator walker safety. Pt verbalized & return demo understanding including sit to/from stand from seat. PT instructed in neg ramp & curb with std RW & rollator. Pt return demo understanding.  PT demo & verbal cues on picking up item from floor. Pt return demo 3 reps & verbalized safe set-up to practice.  PT demo simulated casting / fishing with offset feet. Pt verbalized & return demo understanding.   06/30/2022 PATIENT EDUCATION: PATIENT EDUCATED ON FOLLOWING PROSTHETIC CARE: Education details:   Skin check, Residual limb care, Prosthetic cleaning, Correct ply sock adjustment, [place] donning, and Proper wear schedule/adjustment Prosthetic wear tolerance: 2 hours 2-4/day, off 2hrs between wear including liner, 7 days/week Person educated: Patient Education method: Explanation, Demonstration, Tactile cues, and Verbal cues Education comprehension: verbalized understanding, verbal cues required, tactile cues required, and needs further education     HOME EXERCISE PROGRAM:     ASSESSMENT:    CLINICAL IMPRESSION: Today's session included standing to floor transfer and advice for set up and prosthesis management with biking as pt reported intention to start biking and doing yoga at home. He ambulates in clinic with a standalone cane with supervision and curb with minA. Gait indicates improved functional mobility and dynamic balance. He is continuing to benefit from skilled PT to address functional limitations and prosthetic care.   OBJECTIVE IMPAIRMENTS Abnormal gait, cardiopulmonary status limiting activity, decreased activity tolerance, decreased balance, decreased endurance, decreased knowledge of condition, decreased knowledge of use of DME, decreased mobility, difficulty walking, decreased strength, increased edema, postural dysfunction, prosthetic dependency , obesity, pain, and skin integrity issues on residual limb .    ACTIVITY LIMITATIONS carrying, lifting, bending, sitting, standing, squatting, stairs, transfers, locomotion level, and safe prosthesis use   PARTICIPATION LIMITATIONS: driving, community  activity, and recreational activities   [place], Time since onset of injury/illness/exacerbation, and 3+ comorbidities: see PMH  are also affecting patient's functional outcome.    REHAB POTENTIAL: Good   CLINICAL DECISION MAKING: Evolving/moderate complexity   EVALUATION COMPLEXITY: Moderate     GOALS: Goals reviewed with patient? Yes   SHORT TERM GOALS: Target date: 07/29/2022   Patient donnes prosthesis modified independent & verbalizes proper cleaning. Baseline: SEE OBJECTIVE DATA Goal status: INITIAL 2.  Patient tolerates prosthesis >12 hrs total /day without increase in skin issues or limb pain </= 2/10 after standing. Baseline: SEE OBJECTIVE DATA Goal status: INITIAL   3.  Patient able to reach 10", pick up items from floor and look over both shoulders without UE support with supervision. Baseline: SEE OBJECTIVE DATA Goal status: INITIAL    4. Patient ambulates 200' with cane & prosthesis with supervision. Baseline: SEE OBJECTIVE DATA Goal status: INITIAL   5. Patient negotiates ramps & curbs with cane & prosthesis with minA. Baseline: SEE OBJECTIVE DATA Goal status: INITIAL     LONG TERM GOALS: Target date: 09/24/2022   Patient demonstrates & verbalized understanding of prosthetic care to enable safe utilization of prosthesis. Baseline: SEE OBJECTIVE DATA Goal status: INITIAL   Patient tolerates prosthesis wear >90% of awake hours without skin or limb pain issues. Baseline: SEE OBJECTIVE DATA Goal status: INITIAL   Berg Balance >/= 40/56  to indicate lower fall risk Baseline: SEE OBJECTIVE DATA Goal status: INITIAL   Patient ambulates >500' with prosthesis & cane or less independently Baseline: SEE OBJECTIVE DATA Goal status: INITIAL   Patient negotiates ramps, curbs & stairs with single rail with prosthesis cane or less independently. Baseline: SEE OBJECTIVE DATA Goal status: INITIAL   Patient demonstrates & verbalizes climbing ladder, lifting, carrying, pushing, pulling with prosthesis safely. Baseline: SEE OBJECTIVE DATA Goal status: INITIAL   Patient verbalizes & demonstrates understanding of fishing simulations.  Baseline: SEE OBJECTIVE DATA Goal status: INITIAL   PLAN: PT FREQUENCY: 2x/week   PT DURATION: 90 days / 13 weeks   PLANNED INTERVENTIONS: Therapeutic exercises, Therapeutic activity, Neuromuscular re-education, Balance training, Gait training, Patient/Family education, Stair training, Vestibular training, Prosthetic training, DME instructions, and Physical Performance Testing   PLAN FOR NEXT SESSION: prosthetic gait with cane including ramp & curb, standing balance including compliant surfaces  Orli Degrave, Student-PT 07/20/2022, 1:34 PM  This entire session of physical therapy was performed under the direct supervision of PT signing evaluation /treatment. PT reviewed note and  agrees.  Jamey Reas, PT, DPT 07/20/2022, 3:12 PM

## 2022-07-20 NOTE — Progress Notes (Signed)
   07/20/22 1506  PT Education  Education Details Access Code: [email or web address]  Person(s) Educated Patient  Methods Explanation;Demonstration;Tactile cues;Verbal cues;Handout  Comprehension Verbalized understanding;Returned demonstration;Need further instruction

## 2022-07-22 ENCOUNTER — Ambulatory Visit (INDEPENDENT_AMBULATORY_CARE_PROVIDER_SITE_OTHER): Payer: Medicare Other | Admitting: Physical Therapy

## 2022-07-22 ENCOUNTER — Encounter: Payer: Self-pay | Admitting: Physical Therapy

## 2022-07-22 DIAGNOSIS — R293 Abnormal posture: Secondary | ICD-10-CM

## 2022-07-22 DIAGNOSIS — M6281 Muscle weakness (generalized): Secondary | ICD-10-CM

## 2022-07-22 DIAGNOSIS — R2689 Other abnormalities of gait and mobility: Secondary | ICD-10-CM

## 2022-07-22 DIAGNOSIS — R2681 Unsteadiness on feet: Secondary | ICD-10-CM | POA: Diagnosis not present

## 2022-07-22 DIAGNOSIS — M79661 Pain in right lower leg: Secondary | ICD-10-CM

## 2022-07-22 NOTE — Therapy (Signed)
OUTPATIENT PHYSICAL THERAPY TREATMENT NOTE   Patient Name: Eduardo [name] MRN: [id number] DOB:May 11, 1956, 66 y.o., male Today's Date: 07/22/2022  PCP: Rita Ohara, MD REFERRING PROVIDER: Newt Minion, MD  END OF SESSION:   PT End of Session - 07/22/22 1140     Visit Number 5    Number of Visits 26    Date for PT Re-Evaluation 09/24/22    Authorization Type [employer] Medicare    Authorization Time Period $20 copay    Progress Note Due on Visit 10    PT Start Time 1145    PT Stop Time 1228    PT Time Calculation (min) 43 min    Equipment Utilized During Treatment Gait belt    Activity Tolerance Patient tolerated treatment well;Patient limited by pain    Behavior During Therapy WFL for tasks assessed/performed              Past Medical History:  Diagnosis Date   CHF (congestive heart failure) ([place])    Chronic kidney disease    stage 3   Colon polyp    Coronary artery disease    Diabetes mellitus 1987   under care of Dr. Chalmers Cater.  On insulin since 96 (off and on)   Diabetic retinopathy    Dupuytren contracture    R hand, s/p injection (Dr. Lenon Curt)   Essential hypertension, benign    Essential hypertension, benign 02/06/2019   Frequency of urination and polyuria    Hypertension    Myocardial infarction [facility])    denies   Neuromuscular disorder ([place])    Diabetic neuropathy   Osteomyelitis ([place])    right foot   Other testicular hypofunction    Peripheral arterial disease ([place]) 10/28/2012   Peritoneal abscess ([place]) 6/08   and buttock.   Pneumonia    Polydipsia    Proteinuria    Pure hyperglyceridemia    Subacute osteomyelitis, right ankle and foot ([place])    Wears glasses    Past Surgical History:  Procedure Laterality Date   ABDOMINAL AORTAGRAM N/A 04/18/2012   Procedure: ABDOMINAL Maxcine Ham;  Surgeon: Angelia Mould, MD;  Location[facility] CATH LAB;  Service: Cardiovascular;  Laterality: N/A;   AMPUTATION Right 05/19/2019   Procedure: RIGHT FOOT 5TH RAY AMPUTATION;   Surgeon: Newt Minion, MD;  Location: [place];  Service: Orthopedics;  Laterality: Right;   AMPUTATION Right 03/11/2022   Procedure: RIGHT LEG DEBRIDEMENT VS. BELOW KNEE AMPUTATION;  Surgeon: Newt Minion, MD;  Location: [place];  Service: Orthopedics;  Laterality: Right;   CARDIAC CATHETERIZATION N/A 09/08/2016   Procedure: Left Heart Cath and Coronary Angiography;  Surgeon: Adrian Prows, MD;  Location: [place] CV LAB;  Service: Cardiovascular;  Laterality: N/A;   CATARACT EXTRACTION, BILATERAL  09/2017, 10/2017   Dr. Herbert Deaner   COLONOSCOPY W/ BIOPSIES AND POLYPECTOMY     CORONARY/GRAFT ACUTE MI REVASCULARIZATION N/A 10/11/2020   Procedure: Coronary/Graft Acute MI Revascularization;  Surgeon: Adrian Prows, MD;  Location: [place] CV LAB;  Service: Cardiovascular;  Laterality: N/A;   I & D EXTREMITY Right 03/11/2022   Procedure: BELOW KNEE AMPUTATION;  Surgeon: Newt Minion, MD;  Location: [place];  Service: Orthopedics;  Laterality: Right;   LEFT HEART CATH N/A 12/31/2020   Procedure: Left Heart Cath;  Surgeon: Adrian Prows, MD;  Location: [place] CV LAB;  Service: Cardiovascular;  Laterality: N/A;   LEFT HEART CATH AND CORONARY ANGIOGRAPHY N/A 10/11/2020   Procedure: LEFT HEART CATH AND CORONARY ANGIOGRAPHY;  Surgeon: Adrian Prows, MD;  Location: [place] CV LAB;  Service: Cardiovascular;  Laterality: N/A;   LOWER EXTREMITY ANGIOGRAM Bilateral 04/18/2012   Procedure: LOWER EXTREMITY ANGIOGRAM;  Surgeon: Angelia Mould, MD;  Location: [facility] CATH LAB;  Service: Cardiovascular;  Laterality: Bilateral;  bilat lower extrem angio   LOWER EXTREMITY ANGIOGRAPHY Bilateral 05/02/2019   Procedure: LOWER EXTREMITY ANGIOGRAPHY;  Surgeon: Adrian Prows, MD;  Location: [place] CV LAB;  Service: Cardiovascular;  Laterality: Bilateral;   LOWER EXTREMITY ANGIOGRAPHY Bilateral 02/28/2019   Procedure: LOWER EXTREMITY ANGIOGRAPHY;  Surgeon: Adrian Prows, MD;  Location: [place] CV LAB;  Service: Cardiovascular;   Laterality: Bilateral;   macular photocoagulation     (eye treatments for diabetic retinopathy)-Dr. Zigmund Daniel   PERIPHERAL VASCULAR INTERVENTION  02/28/2019   Procedure: PERIPHERAL VASCULAR INTERVENTION;  Surgeon: Adrian Prows, MD;  Location: [place] CV LAB;  Service: Cardiovascular;;   TEE WITHOUT CARDIOVERSION N/A 03/18/2022   Procedure: TRANSESOPHAGEAL ECHOCARDIOGRAM (TEE);  Surgeon: Adrian Prows, MD;  Location: [facility] ENDOSCOPY;  Service: Cardiovascular;  Laterality: N/A;   VENTRICULAR ASSIST DEVICE INSERTION N/A 12/31/2020   Procedure: VENTRICULAR ASSIST DEVICE INSERTION;  Surgeon: Adrian Prows, MD;  Location: [place] CV LAB;  Service: Cardiovascular;  Laterality: N/A;   Patient Active Problem List   Diagnosis Date Noted   Chronic right-sided congestive heart failure ([place]) 05/18/2022   Ejection fraction < 50% 04/17/2022   Insomnia    Constipation    S/P BKA (below knee amputation), right ([place]) 03/21/2022   Ischemic cardiomyopathy    AKI (acute kidney injury) ([place]) 03/13/2022   Streptococcal bacteremia 03/13/2022   Hypophosphatemia 03/13/2022   Septic shock ([place]) 73/53/2992   Acute systolic heart failure ([place]) 12/28/2020   Acute pulmonary edema ([place]) 10/11/2020   Non-ST elevation (NSTEMI) myocardial infarction ([place]) 10/11/2020   Acute respiratory distress 10/11/2020   Acute on chronic combined systolic and diastolic CHF (congestive heart failure) ([place]) 10/11/2020   Cardiogenic shock (HCC)    Partial nontraumatic amputation of foot, right ([place]) 06/21/2019   Osteomyelitis of right foot ([place])    Essential hypertension, benign 02/06/2019   Hypercholesteremia 02/06/2019   Stage 3b chronic kidney disease (CKD) ([place]) 10/06/2017   Proteinuria 10/06/2017   Controlled type 2 diabetes mellitus with hyperglycemia, with long-term current use of insulin ([place]) 10/06/2017   Coronary artery disease involving native coronary artery of native heart without angina pectoris 05/23/2017   Abnormal nuclear  stress test 09/06/2016   PAD (peripheral artery disease) ([place]) 10/28/2012   Morbid obesity with BMI of 40.0-44.9, adult ([place]) 10/13/2012   Diabetes mellitus with ophthalmic complication ([place]) 42/68/3419   Hypertension associated with diabetes ([place]) 10/13/2012   Atherosclerosis of native artery of extremity with intermittent claudication ([place]) 04/05/2012   Adenomatous colon polyp 09/28/2011   Diabetes mellitus ([place]) 09/28/2011   Pure hyperglyceridemia 09/28/2011   Erectile dysfunction 09/28/2011   Diabetic retinopathy ([place]) 09/28/2011    REFERRING DIAG: Z89.511 (ICD-10-CM) - S/P below knee amputation, right  ONSET DATE: 06/25/2022 prosthesis delivery  THERAPY DIAG:  Other abnormalities of gait and mobility  Unsteadiness on feet  Muscle weakness (generalized)  Abnormal posture  Pain in right lower leg  Rationale for Evaluation and Treatment Rehabilitation  PERTINENT HISTORY: combined systolic & diastolic CHF/cardiomyopathy EF 15-20%, DM2, PVD/PAD, CAD, CABG 2017, HTN, HLD, morbid obesity, CKD st3b, diabetic retinopathy  PRECAUTIONS: None  SUBJECTIVE: He wore prosthesis 5hrs on, 1 hr off with 14 hrs total on Monday & 12hrs yesterday.  He is having to  add ply socks throughout day. He has not tried bike because needs to order new pedals.  He got on floor and it went okay.   PAIN:  Are you having pain?  No   OBJECTIVE: (objective measures completed at initial evaluation unless otherwise dated)  OBJECTIVE:    COGNITION: 06/30/2022  Overall cognitive status: Within functional limits for tasks assessed   POSTURE: 06/30/2022  rounded shoulders, forward head, and weight shift left   LOWER EXTREMITY ROM:   ROM P:passive  A:active Right Eval 06/30/2022 Left eval  Hip flexion      Hip extension      Hip abduction      Hip adduction      Hip internal rotation      Hip external rotation      Knee flexion      Knee extension 0*    Ankle dorsiflexion      Ankle  plantarflexion      Ankle inversion      Ankle eversion       (Blank rows = not tested)   LOWER EXTREMITY MMT:   MMT Right Eval 06/30/2022 Left eval  Hip flexion      Hip extension 4/5    Hip abduction 4/5    Hip adduction      Hip internal rotation      Hip external rotation      Knee flexion 4/5    Knee extension 4/5    Ankle dorsiflexion NA    Ankle plantarflexion NA    Ankle inversion NA    Ankle eversion NA    Evaluation testing functionally (Blank rows = not tested)   TRANSFERS: 06/30/2022  Sit to stand: SBA from chairs with armrests using back of LLE against chair to stabilize 06/30/2022   Stand to sit:  SBA using UEs to control descent   GAIT: 07/20/2022 Pt ambulates with step-through pattern with RW and Rt medial whip  06/30/2022 Gait pattern: step to pattern, decreased step length- Left, decreased stance time- Right, decreased stride length, decreased hip/knee flexion- Right, Right hip hike, antalgic, and abducted- Right Distance walked: 100' Assistive device utilized: Environmental consultant - 2 wheeled and TTA prosthesis Level of assistance: SBA  needs cues for gait deviations / no assist physically with RW support Comments: excessive UE weight bearing on RW to limit weight on prosthesis.    FUNCTIONAL TESTs:   Merrilee Jansky Balance Scale: 06/30/2022:   20/56   [facility] PT Assessment - 06/30/22 1345                Standardized Balance Assessment    Standardized Balance Assessment Berg Balance Test          Berg Balance Test    Sit to Stand Needs minimal aid to stand or to stabilize     Standing Unsupported Able to stand 2 minutes with supervision     Sitting with Back Unsupported but Feet Supported on Floor or Stool Able to sit safely and securely 2 minutes     Stand to Sit Controls descent by using hands     Transfers Able to transfer safely, definite need of hands     Standing Unsupported with Eyes Closed Unable to keep eyes closed 3 seconds but stays steady     Standing  Unsupported with Feet Together Needs help to attain position but able to stand for 30 seconds with feet together     From Standing, Reach Forward with Outstretched Arm Reaches forward but  needs supervision     From Standing Position, Pick up Object from Floor Unable to try/needs assist to keep balance     From Standing Position, Turn to Look Behind Over each Shoulder Needs supervision when turning     Turn 360 Degrees Needs assistance while turning     Standing Unsupported, Alternately Place Feet on Step/Stool Needs assistance to keep from falling or unable to try     Standing Unsupported, One Foot in [place] help to step but can hold 15 seconds     Standing on One Leg Tries to lift leg/unable to hold 3 seconds but remains standing independently     Total Score 20     Berg comment: BERG  < 36 high risk for falls (close to 100%) 46-51 moderate (>50%)   37-45 significant (>80%) 52-55 lower (> 25%)                   CURRENT PROSTHETIC WEAR ASSESSMENT: 06/30/2022 Patient is dependent with: skin check, residual limb care, prosthetic cleaning, ply sock cleaning, correct ply sock adjustment, proper wear schedule/adjustment, and proper weight-bearing schedule/adjustment Donning prosthesis: SBA Doffing prosthesis: SBA Prosthetic wear tolerance: 2 hours, 2x/day, 5 days since delivery Prosthetic weight bearing tolerance: 5 minutes with residual limb pain 3-4/10 Edema: pitting Residual limb condition:  scab medially 43m and dry scab lateral scar 358m  Good hair growth, normal color & temperature.  Cylindrical shape Prosthetic description: silicon liner with pin lock suspension, total contact socket, dynamic response foot K code/activity level with prosthetic use: Level 3     TODAY'S TREATMENT:  07/22/2022 Neuromuscular Re-ed: Standing hip width stance crossways on foam beam with visual, verbal & tactile cues on balance reactions & even weight on BLEs  -static eyes open up to 5 sec  -eyes  open head motions right/left, up/down & diagonals with minA & intermittent touch //bars  -static eyes closed up to 3 sec  Standing hip width on floor head motions right/left, up/down & diagonals with intermittent touch //bars  Rocker board ant/level/post & right/level/left with light BUE support with focus on weight shift & knee control.  Sit to/from stand from 24" bar stool 5 reps with hands on thighs & 5 reps without UE support.  Prosthetic Training with TTA prosthesis: Continue wear 5 hrs on, 1 hr off, most of awake hours.  PT recommended changing & washing liner early evening.  Pt verbalized understanding.  Pt amb 150' with cane working on scanning right/left and up/down with minA for balance.  Pt amb 150' with cane working on scanning up-right/down-left and up-left/down-right with minA for balance.   07/20/2022 Prosthetic Training with TTA prosthesis ~47m30med blister noted on distal tibia and pt notes it has been there since wound vac. PT educated on sweat management for skin integrity and antiperspirant use, and pt verbalized understanding. PT recommended wear time 4 hrs on, 1 hr off, 3x/day Pt donned prosthesis correctly. PT demo floor transfer with chair support as pt is interested in seated/supine yoga and pt returned demo x 3 with standing to half kneeling to kneeling to seated with SBA and ability to stabilize without use of cane upon standing PT instructed in getting on and off [place] bike with advice in set up for use with prosthesis, pt verbalized understanding and used recumbent bike 2 min seat 7 Pt amb 50' with standalone cane LUE with SBA, then 100' with cane with SBA including curb negotiation with minA for recover loss of balance  during ambulation. Pt ascended and descended curb x 2 with minA following PT demo of technique including using momentum  TherEx: Seated quad set RLE with hip ER to target VMO x 3 with 5s hold Seated SLR RLE with hip ER to target VMO x 10  HOME  EXERCISE PROGRAM:  Access Code: [email or web address] URL: [email or web address] Date: 07/20/2022 Prepared by: Jamey Reas  Exercises - Seated Quad Set  - 3-5 x daily - 7 x weekly - 2-3 sets - 10-20 reps - 5 seconds hold - Straight Leg Raise with External Rotation  - 3-5 x daily - 7 x weekly - 2-3 sets - 10-20 reps - 5 seconds hold   Do each exercise 1-2  times per day Do each exercise 5-10 repetitions Hold each exercise for 2 seconds to feel your location  AT [place].  Try to find this position when standing still for activities.   USE TAPE ON FLOOR TO MARK THE MIDLINE POSITION which is even with middle of sink.  You also should try to feel with your limb pressure in socket.  You are trying to feel with limb what you used to feel with the bottom of your foot.  Side to Side Shift: Moving your hips only (not shoulders): move weight onto your left leg, HOLD/FEEL pressure in socket.  Move back to equal weight on each leg, HOLD/FEEL pressure in socket. Move weight onto your right leg, HOLD/FEEL pressure in socket. Move back to equal weight on each leg, HOLD/FEEL pressure in socket. Repeat.  Start with both hands on sink, progress to hand on prosthetic side only, then no hands.  Front to Back Shift: Moving your hips only (not shoulders): move your weight forward onto your toes, HOLD/FEEL pressure in socket. Move your weight back to equal Flat Foot on both legs, HOLD/FEEL  pressure in socket. Move your weight back onto your heels, HOLD/FEEL  pressure in socket. Move your weight back to equal on both legs, HOLD/FEEL  pressure in socket. Repeat.  Start with both hands on sink, progress to hand on prosthetic side only, then no hands.  Moving Cones / Cups: With equal weight on each leg: Hold on with one hand the first time, then progress to no hand supports. Move cups from one side of sink to the other. Place cups ~2" out of your  reach, progress to 10" beyond reach.  Place one hand in middle of sink and reach with other hand. Do both arms.  Then hover one hand and move cups with other hand.  Overhead/Upward Reaching: alternated reaching up to top cabinets or ceiling if no cabinets present. Keep equal weight on each leg. Start with one hand support on counter while other hand reaches and progress to no hand support with reaching.  ace one hand in middle of sink and reach with other hand. Do both arms.  Then hover one hand and move cups with other hand.  5.   Looking Over Shoulders: With equal weight on each leg: alternate turning to look over your shoulders with one hand support on counter as needed.  Start with head motions only to look in front of shoulder, then even with shoulder and progress to looking behind you. To look to side, move head /eyes, then shoulder on side looking pulls back, shift more weight to side looking and pull hip back. Place one hand in middle of sink and let go with other  hand so your shoulder can pull back. Switch hands to look other way.   Then hover one hand and look over shoulder. If looking right, use left hand at sink. If looking left, use right hand at sink. 6.  Stepping with leg that is not amputated:  Move items under cabinet out of your way. Shift your hips/pelvis so weight on prosthesis. Tighten muscles in hip on prosthetic side.  SLOWLY step other leg so front of foot is in cabinet. Then step back to floor.       ASSESSMENT:   CLINICAL IMPRESSION: Patient was challenged by balance activities but improved with repetition & PT instruction.  Pt improved prosthetic gait with cane but is limited in distance. Patient continues to benefit from skilled PT to improve mobility & safety with prosthesis.    OBJECTIVE IMPAIRMENTS Abnormal gait, cardiopulmonary status limiting activity, decreased activity tolerance, decreased balance, decreased endurance, decreased knowledge of condition, decreased  knowledge of use of DME, decreased mobility, difficulty walking, decreased strength, increased edema, postural dysfunction, prosthetic dependency , obesity, pain, and skin integrity issues on residual limb .    ACTIVITY LIMITATIONS carrying, lifting, bending, sitting, standing, squatting, stairs, transfers, locomotion level, and safe prosthesis use   PARTICIPATION LIMITATIONS: driving, community activity, and recreational activities   [place], Time since onset of injury/illness/exacerbation, and 3+ comorbidities: see PMH  are also affecting patient's functional outcome.    REHAB POTENTIAL: Good   CLINICAL DECISION MAKING: Evolving/moderate complexity   EVALUATION COMPLEXITY: Moderate     GOALS: Goals reviewed with patient? Yes   SHORT TERM GOALS: Target date: 07/29/2022   Patient donnes prosthesis modified independent & verbalizes proper cleaning. Baseline: SEE OBJECTIVE DATA Goal status: INITIAL 2.  Patient tolerates prosthesis >12 hrs total /day without increase in skin issues or limb pain </= 2/10 after standing. Baseline: SEE OBJECTIVE DATA Goal status: INITIAL   3.  Patient able to reach 10", pick up items from floor and look over both shoulders without UE support with supervision. Baseline: SEE OBJECTIVE DATA Goal status: INITIAL   4. Patient ambulates 200' with cane & prosthesis with supervision. Baseline: SEE OBJECTIVE DATA Goal status: INITIAL   5. Patient negotiates ramps & curbs with cane & prosthesis with minA. Baseline: SEE OBJECTIVE DATA Goal status: INITIAL     LONG TERM GOALS: Target date: 09/24/2022   Patient demonstrates & verbalized understanding of prosthetic care to enable safe utilization of prosthesis. Baseline: SEE OBJECTIVE DATA Goal status: INITIAL   Patient tolerates prosthesis wear >90% of awake hours without skin or limb pain issues. Baseline: SEE OBJECTIVE DATA Goal status: INITIAL   Berg Balance >/= 40/56  to indicate  lower fall risk Baseline: SEE OBJECTIVE DATA Goal status: INITIAL   Patient ambulates >500' with prosthesis & cane or less independently Baseline: SEE OBJECTIVE DATA Goal status: INITIAL   Patient negotiates ramps, curbs & stairs with single rail with prosthesis cane or less independently. Baseline: SEE OBJECTIVE DATA Goal status: INITIAL   Patient demonstrates & verbalizes climbing ladder, lifting, carrying, pushing, pulling with prosthesis safely. Baseline: SEE OBJECTIVE DATA Goal status: INITIAL   Patient verbalizes & demonstrates understanding of fishing simulations.  Baseline: SEE OBJECTIVE DATA Goal status: INITIAL   PLAN: PT FREQUENCY: 2x/week   PT DURATION: 90 days / 13 weeks   PLANNED INTERVENTIONS: Therapeutic exercises, Therapeutic activity, Neuromuscular re-education, Balance training, Gait training, Patient/Family education, Stair training, Vestibular training, Prosthetic training, DME instructions, and Physical Performance Testing   PLAN FOR  NEXT SESSION: check STGs next week, prosthetic gait with cane including ramp & curb, standing balance including compliant surfaces  Jamey Reas, PT, DPT 07/22/2022, 12:43 PM

## 2022-07-27 ENCOUNTER — Ambulatory Visit (INDEPENDENT_AMBULATORY_CARE_PROVIDER_SITE_OTHER): Payer: Medicare Other | Admitting: Physical Therapy

## 2022-07-27 ENCOUNTER — Encounter: Payer: Self-pay | Admitting: Physical Therapy

## 2022-07-27 DIAGNOSIS — M6281 Muscle weakness (generalized): Secondary | ICD-10-CM

## 2022-07-27 DIAGNOSIS — R2681 Unsteadiness on feet: Secondary | ICD-10-CM

## 2022-07-27 DIAGNOSIS — M79661 Pain in right lower leg: Secondary | ICD-10-CM

## 2022-07-27 DIAGNOSIS — R293 Abnormal posture: Secondary | ICD-10-CM | POA: Diagnosis not present

## 2022-07-27 DIAGNOSIS — R2689 Other abnormalities of gait and mobility: Secondary | ICD-10-CM

## 2022-07-27 NOTE — Therapy (Signed)
OUTPATIENT PHYSICAL THERAPY TREATMENT NOTE   Patient Name: Eduardo [name] MRN: [id number] DOB:April 19, 1956, 66 y.o., male Today's Date: 07/27/2022  PCP: Rita Ohara, MD REFERRING PROVIDER: Newt Minion, MD  END OF SESSION:   PT End of Session - 07/27/22 1436     Visit Number 6    Number of Visits 26    Date for PT Re-Evaluation 09/24/22    Authorization Type [employer] Medicare    Authorization Time Period $20 copay    Progress Note Due on Visit 10    PT Start Time 1433    PT Stop Time 1516    PT Time Calculation (min) 43 min    Equipment Utilized During Treatment Gait belt    Activity Tolerance Patient tolerated treatment well;Patient limited by pain    Behavior During Therapy WFL for tasks assessed/performed             Past Medical History:  Diagnosis Date   CHF (congestive heart failure) ([place])    Chronic kidney disease    stage 3   Colon polyp    Coronary artery disease    Diabetes mellitus 1987   under care of Dr. Chalmers Cater.  On insulin since 96 (off and on)   Diabetic retinopathy    Dupuytren contracture    R hand, s/p injection (Dr. Lenon Curt)   Essential hypertension, benign    Essential hypertension, benign 02/06/2019   Frequency of urination and polyuria    Hypertension    Myocardial infarction [facility])    denies   Neuromuscular disorder ([place])    Diabetic neuropathy   Osteomyelitis ([place])    right foot   Other testicular hypofunction    Peripheral arterial disease ([place]) 10/28/2012   Peritoneal abscess ([place]) 6/08   and buttock.   Pneumonia    Polydipsia    Proteinuria    Pure hyperglyceridemia    Subacute osteomyelitis, right ankle and foot ([place])    Wears glasses    Past Surgical History:  Procedure Laterality Date   ABDOMINAL AORTAGRAM N/A 04/18/2012   Procedure: ABDOMINAL Maxcine Ham;  Surgeon: Angelia Mould, MD;  Location: [facility] CATH LAB;  Service: Cardiovascular;  Laterality: N/A;   AMPUTATION Right 05/19/2019   Procedure: RIGHT FOOT 5TH RAY AMPUTATION;   Surgeon: Newt Minion, MD;  Location: [place];  Service: Orthopedics;  Laterality: Right;   AMPUTATION Right 03/11/2022   Procedure: RIGHT LEG DEBRIDEMENT VS. BELOW KNEE AMPUTATION;  Surgeon: Newt Minion, MD;  Location: [place];  Service: Orthopedics;  Laterality: Right;   CARDIAC CATHETERIZATION N/A 09/08/2016   Procedure: Left Heart Cath and Coronary Angiography;  Surgeon: Adrian Prows, MD;  Location: [place] CV LAB;  Service: Cardiovascular;  Laterality: N/A;   CATARACT EXTRACTION, BILATERAL  09/2017, 10/2017   Dr. Herbert Deaner   COLONOSCOPY W/ BIOPSIES AND POLYPECTOMY     CORONARY/GRAFT ACUTE MI REVASCULARIZATION N/A 10/11/2020   Procedure: Coronary/Graft Acute MI Revascularization;  Surgeon: Adrian Prows, MD;  Location: [place] CV LAB;  Service: Cardiovascular;  Laterality: N/A;   I & D EXTREMITY Right 03/11/2022   Procedure: BELOW KNEE AMPUTATION;  Surgeon: Newt Minion, MD;  Location: [place];  Service: Orthopedics;  Laterality: Right;   LEFT HEART CATH N/A 12/31/2020   Procedure: Left Heart Cath;  Surgeon: Adrian Prows, MD;  Location: [place] CV LAB;  Service: Cardiovascular;  Laterality: N/A;   LEFT HEART CATH AND CORONARY ANGIOGRAPHY N/A 10/11/2020   Procedure: LEFT HEART CATH AND CORONARY ANGIOGRAPHY;  Surgeon: Adrian Prows, MD;  Location: [place] CV LAB;  Service: Cardiovascular;  Laterality: N/A;   LOWER EXTREMITY ANGIOGRAM Bilateral 04/18/2012   Procedure: LOWER EXTREMITY ANGIOGRAM;  Surgeon: Angelia Mould, MD;  Location: [facility] CATH LAB;  Service: Cardiovascular;  Laterality: Bilateral;  bilat lower extrem angio   LOWER EXTREMITY ANGIOGRAPHY Bilateral 05/02/2019   Procedure: LOWER EXTREMITY ANGIOGRAPHY;  Surgeon: Adrian Prows, MD;  Location: [place] CV LAB;  Service: Cardiovascular;  Laterality: Bilateral;   LOWER EXTREMITY ANGIOGRAPHY Bilateral 02/28/2019   Procedure: LOWER EXTREMITY ANGIOGRAPHY;  Surgeon: Adrian Prows, MD;  Location: [place] CV LAB;  Service: Cardiovascular;   Laterality: Bilateral;   macular photocoagulation     (eye treatments for diabetic retinopathy)-Dr. Zigmund Daniel   PERIPHERAL VASCULAR INTERVENTION  02/28/2019   Procedure: PERIPHERAL VASCULAR INTERVENTION;  Surgeon: Adrian Prows, MD;  Location: [place] CV LAB;  Service: Cardiovascular;;   TEE WITHOUT CARDIOVERSION N/A 03/18/2022   Procedure: TRANSESOPHAGEAL ECHOCARDIOGRAM (TEE);  Surgeon: Adrian Prows, MD;  Location: [facility] ENDOSCOPY;  Service: Cardiovascular;  Laterality: N/A;   VENTRICULAR ASSIST DEVICE INSERTION N/A 12/31/2020   Procedure: VENTRICULAR ASSIST DEVICE INSERTION;  Surgeon: Adrian Prows, MD;  Location: [place] CV LAB;  Service: Cardiovascular;  Laterality: N/A;   Patient Active Problem List   Diagnosis Date Noted   Chronic right-sided congestive heart failure ([place]) 05/18/2022   Ejection fraction < 50% 04/17/2022   Insomnia    Constipation    S/P BKA (below knee amputation), right ([place]) 03/21/2022   Ischemic cardiomyopathy    AKI (acute kidney injury) ([place]) 03/13/2022   Streptococcal bacteremia 03/13/2022   Hypophosphatemia 03/13/2022   Septic shock ([place]) 03/00/9233   Acute systolic heart failure ([place]) 12/28/2020   Acute pulmonary edema ([place]) 10/11/2020   Non-ST elevation (NSTEMI) myocardial infarction ([place]) 10/11/2020   Acute respiratory distress 10/11/2020   Acute on chronic combined systolic and diastolic CHF (congestive heart failure) ([place]) 10/11/2020   Cardiogenic shock (HCC)    Partial nontraumatic amputation of foot, right ([place]) 06/21/2019   Osteomyelitis of right foot ([place])    Essential hypertension, benign 02/06/2019   Hypercholesteremia 02/06/2019   Stage 3b chronic kidney disease (CKD) ([place]) 10/06/2017   Proteinuria 10/06/2017   Controlled type 2 diabetes mellitus with hyperglycemia, with long-term current use of insulin ([place]) 10/06/2017   Coronary artery disease involving native coronary artery of native heart without angina pectoris 05/23/2017   Abnormal nuclear  stress test 09/06/2016   PAD (peripheral artery disease) ([place]) 10/28/2012   Morbid obesity with BMI of 40.0-44.9, adult ([place]) 10/13/2012   Diabetes mellitus with ophthalmic complication ([place]) 00/76/2263   Hypertension associated with diabetes ([place]) 10/13/2012   Atherosclerosis of native artery of extremity with intermittent claudication ([place]) 04/05/2012   Adenomatous colon polyp 09/28/2011   Diabetes mellitus ([place]) 09/28/2011   Pure hyperglyceridemia 09/28/2011   Erectile dysfunction 09/28/2011   Diabetic retinopathy ([place]) 09/28/2011    REFERRING DIAG: Z89.511 (ICD-10-CM) - S/P below knee amputation, right  ONSET DATE: 06/25/2022 prosthesis delivery  THERAPY DIAG:  Other abnormalities of gait and mobility  Unsteadiness on feet  Muscle weakness (generalized)  Abnormal posture  Pain in right lower leg  Rationale for Evaluation and Treatment Rehabilitation  PERTINENT HISTORY: combined systolic & diastolic CHF/cardiomyopathy EF 15-20%, DM2, PVD/PAD, CAD, CABG 2017, HTN, HLD, morbid obesity, CKD st3b, diabetic retinopathy  PRECAUTIONS: None  SUBJECTIVE: The past 4-5 days he had a lot of pain in anterior limb and wore the prosthesis 5 hours at the most  on those days with the inability to stand or walk. The pain started without any cause he can remember, and came on 10 minutes after starting walking without AD in house. Prior to the onset of pain, he was wearing the prosthesis 5 hours on and 1 hours off, twice, then the prosthesis again until bed. He has an appointment with the prosthetist on Thursday. He has no pain today. He presented wearing a small pad around the distal tibia.  PAIN:  Are you having pain? 0/10 today, highest 5/10   OBJECTIVE: (objective measures completed at initial evaluation unless otherwise dated)  OBJECTIVE:    COGNITION: 06/30/2022  Overall cognitive status: Within functional limits for tasks assessed   POSTURE: 06/30/2022  rounded shoulders, forward  head, and weight shift left   LOWER EXTREMITY ROM:   ROM P:passive  A:active Right Eval 06/30/2022 Left eval  Hip flexion      Hip extension      Hip abduction      Hip adduction      Hip internal rotation      Hip external rotation      Knee flexion      Knee extension 0*    Ankle dorsiflexion      Ankle plantarflexion      Ankle inversion      Ankle eversion       (Blank rows = not tested)   LOWER EXTREMITY MMT:   MMT Right Eval 06/30/2022 Left eval  Hip flexion      Hip extension 4/5    Hip abduction 4/5    Hip adduction      Hip internal rotation      Hip external rotation      Knee flexion 4/5    Knee extension 4/5    Ankle dorsiflexion NA    Ankle plantarflexion NA    Ankle inversion NA    Ankle eversion NA    Evaluation testing functionally (Blank rows = not tested)   TRANSFERS: 06/30/2022  Sit to stand: SBA from chairs with armrests using back of LLE against chair to stabilize 06/30/2022   Stand to sit:  SBA using UEs to control descent   GAIT: 07/20/2022 Pt ambulates with step-through pattern with RW and Rt medial whip  06/30/2022 Gait pattern: step to pattern, decreased step length- Left, decreased stance time- Right, decreased stride length, decreased hip/knee flexion- Right, Right hip hike, antalgic, and abducted- Right Distance walked: 100' Assistive device utilized: Environmental consultant - 2 wheeled and TTA prosthesis Level of assistance: SBA  needs cues for gait deviations / no assist physically with RW support Comments: excessive UE weight bearing on RW to limit weight on prosthesis.    FUNCTIONAL TESTs:   Merrilee Jansky Balance Scale: 06/30/2022:   20/56   [facility] PT Assessment - 06/30/22 1345                Standardized Balance Assessment    Standardized Balance Assessment Berg Balance Test          Berg Balance Test    Sit to Stand Needs minimal aid to stand or to stabilize     Standing Unsupported Able to stand 2 minutes with supervision     Sitting with Back  Unsupported but Feet Supported on Floor or Stool Able to sit safely and securely 2 minutes     Stand to Sit Controls descent by using hands     Transfers Able to transfer safely, definite need of hands  Standing Unsupported with Eyes Closed Unable to keep eyes closed 3 seconds but stays steady     Standing Unsupported with Feet Together Needs help to attain position but able to stand for 30 seconds with feet together     From Standing, Reach Forward with Outstretched Arm Reaches forward but needs supervision     From Standing Position, Pick up Object from Floor Unable to try/needs assist to keep balance     From Standing Position, Turn to Look Behind Over each Shoulder Needs supervision when turning     Turn 360 Degrees Needs assistance while turning     Standing Unsupported, Alternately Place Feet on Step/Stool Needs assistance to keep from falling or unable to try     Standing Unsupported, One Foot in [place] help to step but can hold 15 seconds     Standing on One Leg Tries to lift leg/unable to hold 3 seconds but remains standing independently     Total Score 20     Berg comment: BERG  < 36 high risk for falls (close to 100%) 46-51 moderate (>50%)   37-45 significant (>80%) 52-55 lower (> 25%)                   CURRENT PROSTHETIC WEAR ASSESSMENT: 06/30/2022 Patient is dependent with: skin check, residual limb care, prosthetic cleaning, ply sock cleaning, correct ply sock adjustment, proper wear schedule/adjustment, and proper weight-bearing schedule/adjustment Donning prosthesis: SBA Doffing prosthesis: SBA Prosthetic wear tolerance: 2 hours, 2x/day, 5 days since delivery Prosthetic weight bearing tolerance: 5 minutes with residual limb pain 3-4/10 Edema: pitting Residual limb condition:  scab medially 4m and dry scab lateral scar 383m  Good hair growth, normal color & temperature.  Cylindrical shape Prosthetic description: silicon liner with pin lock suspension, total  contact socket, dynamic response foot K code/activity level with prosthetic use: Level 3     TODAY'S TREATMENT:  07/27/2022 Prosthetic Training with TTA prosthesis Residual limb has small bleeding tear where sticky pad was pulled off. PT applied Tegaderm over wound on residual limb and reapplied sticky pad with explanation to prevent skin breakdown, pt verbalized understanding. Prosthetist suggested limiting wear until appointment, PT advised limiting weight bearing and using the walker more until appointment. Pt verbalized understanding. PT advised walking without AD only within 1 room or to next room, cane for longer distances in house, walker for use outside of home. Pt verbalized understanding  Neuro Re-ed: Standing hip width on floor eyes closed head motions right/left, up/down & diagonals with intermittent touch //bars Standing hip width crossways on foam beam eyes open head motions right/left, up/down & diagonals with frequent touch //bars - visual and verbal cueing for even WB Anterior reach 8" without UE support Pt looked over each shoulder, required UE support with turn to the Lt Pt picked up item from floor without UE assistance  07/22/2022 Neuromuscular Re-ed: Standing hip width stance crossways on foam beam with visual, verbal & tactile cues on balance reactions & even weight on BLEs  -static eyes open up to 5 sec  -eyes open head motions right/left, up/down & diagonals with minA & intermittent touch //bars  -static eyes closed up to 3 sec  Standing hip width on floor eyes closed head motions right/left, up/down & diagonals with intermittent touch //bars  Rocker board ant/level/post & right/level/left with light BUE support with focus on weight shift & knee control.  Sit to/from stand from 24" bar stool 5 reps with hands on thighs &  5 reps without UE support.  Prosthetic Training with TTA prosthesis: Continue wear 5 hrs on, 1 hr off, most of awake hours.  PT recommended  changing & washing liner early evening.  Pt verbalized understanding.  Pt amb 150' with cane working on scanning right/left and up/down with minA for balance.  Pt amb 150' with cane working on scanning up-right/down-left and up-left/down-right with minA for balance.   07/20/2022 Prosthetic Training with TTA prosthesis ~2m red blister noted on distal tibia and pt notes it has been there since wound vac. PT educated on sweat management for skin integrity and antiperspirant use, and pt verbalized understanding. PT recommended wear time 4 hrs on, 1 hr off, 3x/day Pt donned prosthesis correctly. PT demo floor transfer with chair support as pt is interested in seated/supine yoga and pt returned demo x 3 with standing to half kneeling to kneeling to seated with SBA and ability to stabilize without use of cane upon standing PT instructed in getting on and off [place] bike with advice in set up for use with prosthesis, pt verbalized understanding and used recumbent bike 2 min seat 7 Pt amb 50' with standalone cane LUE with SBA, then 100' with cane with SBA including curb negotiation with minA for recover loss of balance during ambulation. Pt ascended and descended curb x 2 with minA following PT demo of technique including using momentum  TherEx: Seated quad set RLE with hip ER to target VMO x 3 with 5s hold Seated SLR RLE with hip ER to target VMO x 10  HOME EXERCISE PROGRAM:  Access Code: [email or web address]: [email or web address] Date: 07/20/2022 Prepared by: RJamey Reas Exercises - Seated Quad Set  - 3-5 x daily - 7 x weekly - 2-3 sets - 10-20 reps - 5 seconds hold - Straight Leg Raise with External Rotation  - 3-5 x daily - 7 x weekly - 2-3 sets - 10-20 reps - 5 seconds hold   Do each exercise 1-2  times per day Do each exercise 5-10 repetitions Hold each exercise for 2 seconds to feel your location  AT [place]  Try to find this position when standing still for activities.   USE TAPE ON FLOOR TO MARK THE MIDLINE POSITION which is even with middle of sink.  You also should try to feel with your limb pressure in socket.  You are trying to feel with limb what you used to feel with the bottom of your foot.  Side to Side Shift: Moving your hips only (not shoulders): move weight onto your left leg, HOLD/FEEL pressure in socket.  Move back to equal weight on each leg, HOLD/FEEL pressure in socket. Move weight onto your right leg, HOLD/FEEL pressure in socket. Move back to equal weight on each leg, HOLD/FEEL pressure in socket. Repeat.  Start with both hands on sink, progress to hand on prosthetic side only, then no hands.  Front to Back Shift: Moving your hips only (not shoulders): move your weight forward onto your toes, HOLD/FEEL pressure in socket. Move your weight back to equal Flat Foot on both legs, HOLD/FEEL  pressure in socket. Move your weight back onto your heels, HOLD/FEEL  pressure in socket. Move your weight back to equal on both legs, HOLD/FEEL  pressure in socket. Repeat.  Start with both hands on sink, progress to hand on prosthetic side only, then no hands.  Moving Cones / Cups: With equal weight on  each leg: Hold on with one hand the first time, then progress to no hand supports. Move cups from one side of sink to the other. Place cups ~2" out of your reach, progress to 10" beyond reach.  Place one hand in middle of sink and reach with other hand. Do both arms.  Then hover one hand and move cups with other hand.  Overhead/Upward Reaching: alternated reaching up to top cabinets or ceiling if no cabinets present. Keep equal weight on each leg. Start with one hand support on counter while other hand reaches and progress to no hand support with reaching.  ace one hand in middle of sink and reach with other hand. Do both arms.  Then hover one hand and move cups with other hand.  5.   Looking Over  Shoulders: With equal weight on each leg: alternate turning to look over your shoulders with one hand support on counter as needed.  Start with head motions only to look in front of shoulder, then even with shoulder and progress to looking behind you. To look to side, move head /eyes, then shoulder on side looking pulls back, shift more weight to side looking and pull hip back. Place one hand in middle of sink and let go with other hand so your shoulder can pull back. Switch hands to look other way.   Then hover one hand and look over shoulder. If looking right, use left hand at sink. If looking left, use right hand at sink. 6.  Stepping with leg that is not amputated:  Move items under cabinet out of your way. Shift your hips/pelvis so weight on prosthesis. Tighten muscles in hip on prosthetic side.  SLOWLY step other leg so front of foot is in cabinet. Then step back to floor.       ASSESSMENT:   CLINICAL IMPRESSION: Today's session was limited by recent increase in residual limb pain and new wound, with suggestion to reduce weight bearing and prosthesis wear time until prosthetist appointment on Thursday. He tolerated standing balance exercises well, with significant time spent discussing residual limb care. He continues to benefit from skilled PT.   OBJECTIVE IMPAIRMENTS Abnormal gait, cardiopulmonary status limiting activity, decreased activity tolerance, decreased balance, decreased endurance, decreased knowledge of condition, decreased knowledge of use of DME, decreased mobility, difficulty walking, decreased strength, increased edema, postural dysfunction, prosthetic dependency , obesity, pain, and skin integrity issues on residual limb .    ACTIVITY LIMITATIONS carrying, lifting, bending, sitting, standing, squatting, stairs, transfers, locomotion level, and safe prosthesis use   PARTICIPATION LIMITATIONS: driving, community activity, and recreational activities   [place],  Time since onset of injury/illness/exacerbation, and 3+ comorbidities: see PMH  are also affecting patient's functional outcome.    REHAB POTENTIAL: Good   CLINICAL DECISION MAKING: Evolving/moderate complexity   EVALUATION COMPLEXITY: Moderate     GOALS: Goals reviewed with patient? Yes   SHORT TERM GOALS: Target date: 07/29/2022   Patient donnes prosthesis modified independent & verbalizes proper cleaning. Baseline: SEE OBJECTIVE DATA Goal status: met 07/27/22 2.  Patient tolerates prosthesis >12 hrs total /day without increase in skin issues or limb pain </= 2/10 after standing. Baseline: SEE OBJECTIVE DATA Goal status: ongoing 07/27/22   3.  Patient able to reach 10", pick up items from floor and look over both shoulders without UE support with supervision. Baseline: SEE OBJECTIVE DATA Goal status: ongoing 07/27/22   4. Patient ambulates 200' with cane & prosthesis with supervision. Baseline: SEE  OBJECTIVE DATA Goal status: INITIAL   5. Patient negotiates ramps & curbs with cane & prosthesis with minA. Baseline: SEE OBJECTIVE DATA Goal status: INITIAL     LONG TERM GOALS: Target date: 09/24/2022   Patient demonstrates & verbalized understanding of prosthetic care to enable safe utilization of prosthesis. Baseline: SEE OBJECTIVE DATA Goal status: INITIAL   Patient tolerates prosthesis wear >90% of awake hours without skin or limb pain issues. Baseline: SEE OBJECTIVE DATA Goal status: INITIAL   Berg Balance >/= 40/56  to indicate lower fall risk Baseline: SEE OBJECTIVE DATA Goal status: INITIAL   Patient ambulates >500' with prosthesis & cane or less independently Baseline: SEE OBJECTIVE DATA Goal status: INITIAL   Patient negotiates ramps, curbs & stairs with single rail with prosthesis cane or less independently. Baseline: SEE OBJECTIVE DATA Goal status: INITIAL   Patient demonstrates & verbalizes climbing ladder, lifting, carrying, pushing, pulling with  prosthesis safely. Baseline: SEE OBJECTIVE DATA Goal status: INITIAL   Patient verbalizes & demonstrates understanding of fishing simulations.  Baseline: SEE OBJECTIVE DATA Goal status: INITIAL   PLAN: PT FREQUENCY: 2x/week   PT DURATION: 90 days / 13 weeks   PLANNED INTERVENTIONS: Therapeutic exercises, Therapeutic activity, Neuromuscular re-education, Balance training, Gait training, Patient/Family education, Stair training, Vestibular training, Prosthetic training, DME instructions, and Physical Performance Testing   PLAN FOR NEXT SESSION: assess remaining STG as appropriate with changes to prosthesis, continue prosthetic gait with cane including ramp & curb and standing balance including compliant surfaces  Jana Hakim, Student-PT 07/27/2022, 3:52 PM  This entire session of physical therapy was performed under the direct supervision of PT signing evaluation /treatment. PT reviewed note and agrees.  Jamey Reas, PT, DPT 07/27/2022, 4:23 PM

## 2022-07-29 ENCOUNTER — Ambulatory Visit (INDEPENDENT_AMBULATORY_CARE_PROVIDER_SITE_OTHER): Payer: Medicare Other | Admitting: Physical Therapy

## 2022-07-29 ENCOUNTER — Encounter: Payer: Self-pay | Admitting: Physical Therapy

## 2022-07-29 DIAGNOSIS — M6281 Muscle weakness (generalized): Secondary | ICD-10-CM

## 2022-07-29 DIAGNOSIS — R293 Abnormal posture: Secondary | ICD-10-CM | POA: Diagnosis not present

## 2022-07-29 DIAGNOSIS — M79661 Pain in right lower leg: Secondary | ICD-10-CM | POA: Diagnosis not present

## 2022-07-29 DIAGNOSIS — R2681 Unsteadiness on feet: Secondary | ICD-10-CM

## 2022-07-29 DIAGNOSIS — R2689 Other abnormalities of gait and mobility: Secondary | ICD-10-CM | POA: Diagnosis not present

## 2022-07-29 NOTE — Therapy (Signed)
OUTPATIENT PHYSICAL THERAPY TREATMENT NOTE   Patient Name: Eduardo [name] MRN: [id number] DOB:1956/05/10, 66 y.o., male Today's Date: 07/29/2022  PCP: Rita Ohara, MD REFERRING PROVIDER: Newt Minion, MD  END OF SESSION:   PT End of Session - 07/29/22 1148     Visit Number 7    Number of Visits 26    Date for PT Re-Evaluation 09/24/22    Authorization Type [employer] Medicare    Authorization Time Period $20 copay    Progress Note Due on Visit 10    PT Start Time 1145    PT Stop Time 1228    PT Time Calculation (min) 43 min    Equipment Utilized During Treatment Gait belt    Activity Tolerance Patient tolerated treatment well;Patient limited by pain    Behavior During Therapy WFL for tasks assessed/performed              Past Medical History:  Diagnosis Date   CHF (congestive heart failure) ([place])    Chronic kidney disease    stage 3   Colon polyp    Coronary artery disease    Diabetes mellitus 1987   under care of Dr. Chalmers Cater.  On insulin since 96 (off and on)   Diabetic retinopathy    Dupuytren contracture    R hand, s/p injection (Dr. Lenon Curt)   Essential hypertension, benign    Essential hypertension, benign 02/06/2019   Frequency of urination and polyuria    Hypertension    Myocardial infarction [facility])    denies   Neuromuscular disorder ([place])    Diabetic neuropathy   Osteomyelitis ([place])    right foot   Other testicular hypofunction    Peripheral arterial disease ([place]) 10/28/2012   Peritoneal abscess ([place]) 6/08   and buttock.   Pneumonia    Polydipsia    Proteinuria    Pure hyperglyceridemia    Subacute osteomyelitis, right ankle and foot ([place])    Wears glasses    Past Surgical History:  Procedure Laterality Date   ABDOMINAL AORTAGRAM N/A 04/18/2012   Procedure: ABDOMINAL Maxcine Ham;  Surgeon: Angelia Mould, MD;  Location: [facility] CATH LAB;  Service: Cardiovascular;  Laterality: N/A;   AMPUTATION Right 05/19/2019   Procedure: RIGHT FOOT 5TH RAY AMPUTATION;   Surgeon: Newt Minion, MD;  Location: [place];  Service: Orthopedics;  Laterality: Right;   AMPUTATION Right 03/11/2022   Procedure: RIGHT LEG DEBRIDEMENT VS. BELOW KNEE AMPUTATION;  Surgeon: Newt Minion, MD;  Location: [place];  Service: Orthopedics;  Laterality: Right;   CARDIAC CATHETERIZATION N/A 09/08/2016   Procedure: Left Heart Cath and Coronary Angiography;  Surgeon: Adrian Prows, MD;  Location: [place] CV LAB;  Service: Cardiovascular;  Laterality: N/A;   CATARACT EXTRACTION, BILATERAL  09/2017, 10/2017   Dr. Herbert Deaner   COLONOSCOPY W/ BIOPSIES AND POLYPECTOMY     CORONARY/GRAFT ACUTE MI REVASCULARIZATION N/A 10/11/2020   Procedure: Coronary/Graft Acute MI Revascularization;  Surgeon: Adrian Prows, MD;  Location: [place] CV LAB;  Service: Cardiovascular;  Laterality: N/A;   I & D EXTREMITY Right 03/11/2022   Procedure: BELOW KNEE AMPUTATION;  Surgeon: Newt Minion, MD;  Location: [place];  Service: Orthopedics;  Laterality: Right;   LEFT HEART CATH N/A 12/31/2020   Procedure: Left Heart Cath;  Surgeon: Adrian Prows, MD;  Location: [place] CV LAB;  Service: Cardiovascular;  Laterality: N/A;   LEFT HEART CATH AND CORONARY ANGIOGRAPHY N/A 10/11/2020   Procedure: LEFT HEART CATH AND CORONARY ANGIOGRAPHY;  Surgeon: Adrian Prows, MD;  Location: [place] CV LAB;  Service: Cardiovascular;  Laterality: N/A;   LOWER EXTREMITY ANGIOGRAM Bilateral 04/18/2012   Procedure: LOWER EXTREMITY ANGIOGRAM;  Surgeon: Angelia Mould, MD;  Location: [facility] C[facility] LAB;  Service: Cardiovascular;  Laterality: Bilateral;  bilat lower extrem angio   LOWER EXTREMITY ANGIOGRAPHY Bilateral 05/02/2019   Procedure: LOWER EXTREMITY ANGIOGRAPHY;  Surgeon: Adrian Prows, MD;  Location: [place] [facility] LAB;  Service: Cardiovascular;  Laterality: Bilateral;   LOWER EXTREMITY ANGIOGRAPHY Bilateral 02/28/2019   Procedure: LOWER EXTREMITY ANGIOGRAPHY;  Surgeon: Adrian Prows, MD;  Location: [place] CV LAB;  Service: Cardiovascular;   Laterality: Bilateral;   macular photocoagulation     (eye treatments for diabetic retinopathy)-Dr. Zigmund Daniel   PERIPHERAL VASCULAR INTERVENTION  02/28/2019   Procedure: PERIPHERAL VASCULAR INTERVENTION;  Surgeon: Adrian Prows, MD;  Location: [place] CV LAB;  Service: Cardiovascular;;   TEE WITHOUT CARDIOVERSION N/A 03/18/2022   Procedure: TRANSESOPHAGEAL ECHOCARDIOGRAM (TEE);  Surgeon: Adrian Prows, MD;  Location: [facility] ENDOSCOPY;  Service: Cardiovascular;  Laterality: N/A;   VENTRICULAR ASSIST DEVICE INSERTION N/A 12/31/2020   Procedure: VENTRICULAR ASSIST DEVICE INSERTION;  Surgeon: Adrian Prows, MD;  Location: [place] CV LAB;  Service: Cardiovascular;  Laterality: N/A;   Patient Active Problem List   Diagnosis Date Noted   Chronic right-sided congestive heart failure ([place]) 05/18/2022   Ejection fraction < 50% 04/17/2022   Insomnia    Constipation    S/P BKA (below knee amputation), right ([place]) 03/21/2022   Ischemic cardiomyopathy    AKI (acute kidney injury) ([place]) 03/13/2022   Streptococcal bacteremia 03/13/2022   Hypophosphatemia 03/13/2022   Septic shock ([place]) 25/36/6440   Acute systolic heart failure ([place]) 12/28/2020   Acute pulmonary edema ([place]) 10/11/2020   Non-ST elevation (NSTEMI) myocardial infarction ([place]) 10/11/2020   Acute respiratory distress 10/11/2020   Acute on chronic combined systolic and diastolic CHF (congestive heart failure) ([place]) 10/11/2020   Cardiogenic shock ([facility])    Partial nontraumatic amputation of foot, right ([place]) 06/21/2019   Osteomyelitis of right foot ([place])    Essential hypertension, benign 02/06/2019   Hypercholesteremia 02/06/2019   Stage 3b chronic kidney disease (CKD) ([place]) 10/06/2017   Proteinuria 10/06/2017   Controlled type 2 diabetes mellitus with hyperglycemia, with long-term current use of insulin ([place]) 10/06/2017   Coronary artery disease involving native coronary artery of native heart without angina pectoris 05/23/2017   Abnormal nuclear  stress test 09/06/2016   PAD (peripheral artery disease) ([place]) 10/28/2012   Morbid obesity with BMI of 40.0-44.9, adult ([place]) 10/13/2012   Diabetes mellitus with ophthalmic complication ([place]) 34/74/2595   Hypertension associated with diabetes ([place]) 10/13/2012   Atherosclerosis of native artery of extremity with intermittent claudication ([place]) 04/05/2012   Adenomatous colon polyp 09/28/2011   Diabetes mellitus ([place]) 09/28/2011   Pure hyperglyceridemia 09/28/2011   Erectile dysfunction 09/28/2011   Diabetic retinopathy ([place]) 09/28/2011    REFERRING DIAG: Z89.511 (ICD-10-CM) - S/P below knee amputation, right  ONSET DATE: 06/25/2022 prosthesis delivery  THERAPY DIAG:  Other abnormalities of gait and mobility  Unsteadiness on feet  Muscle weakness (generalized)  Abnormal posture  Pain in right lower leg  Rationale for Evaluation and Treatment Rehabilitation  PERTINENT HISTORY: combined systolic & diastolic CHF/cardiomyopathy EF 15-20%, DM2, PVD/PAD, CAD, CABG 2017, HTN, HLD, morbid obesity, CKD st3b, diabetic retinopathy  PRECAUTIONS: None  SUBJECTIVE:   The limb hurt yesterday so only wore prosthesis 6 hrs total (2 & 4)  The wound & pain have  not increased with use of the Tegaderm.  He continues to adjust ply socks also. He sees Psychologist, clinical 11:00 tomorrow.   PAIN:  Are you having pain? 1/10 today, since last PT highest 6/10   OBJECTIVE: (objective measures completed at initial evaluation unless otherwise dated)  OBJECTIVE:    COGNITION: 06/30/2022  Overall cognitive status: Within functional limits for tasks assessed   POSTURE: 06/30/2022  rounded shoulders, forward head, and weight shift left   LOWER EXTREMITY ROM:   ROM P:passive  A:active Right Eval 06/30/2022 Left eval  Hip flexion      Hip extension      Hip abduction      Hip adduction      Hip internal rotation      Hip external rotation      Knee flexion      Knee extension 0*    Ankle dorsiflexion       Ankle plantarflexion      Ankle inversion      Ankle eversion       (Blank rows = not tested)   LOWER EXTREMITY MMT:   MMT Right Eval 06/30/2022 Left eval  Hip flexion      Hip extension 4/5    Hip abduction 4/5    Hip adduction      Hip internal rotation      Hip external rotation      Knee flexion 4/5    Knee extension 4/5    Ankle dorsiflexion NA    Ankle plantarflexion NA    Ankle inversion NA    Ankle eversion NA    Evaluation testing functionally (Blank rows = not tested)   TRANSFERS: 06/30/2022  Sit to stand: SBA from chairs with armrests using back of LLE against chair to stabilize 06/30/2022   Stand to sit:  SBA using UEs to control descent   GAIT: 07/20/2022 Pt ambulates with step-through pattern with RW and Rt medial whip  06/30/2022 Gait pattern: step to pattern, decreased step length- Left, decreased stance time- Right, decreased stride length, decreased hip/knee flexion- Right, Right hip hike, antalgic, and abducted- Right Distance walked: 100' Assistive device utilized: Environmental consultant - 2 wheeled and TTA prosthesis Level of assistance: SBA  needs cues for gait deviations / no assist physically with RW support Comments: excessive UE weight bearing on RW to limit weight on prosthesis.    FUNCTIONAL TESTs:   Merrilee Jansky Balance Scale: 06/30/2022:   20/56   [facility] PT Assessment - 06/30/22 1345                Standardized Balance Assessment    Standardized Balance Assessment Berg Balance Test          Berg Balance Test    Sit to Stand Needs minimal aid to stand or to stabilize     Standing Unsupported Able to stand 2 minutes with supervision     Sitting with Back Unsupported but Feet Supported on Floor or Stool Able to sit safely and securely 2 minutes     Stand to Sit Controls descent by using hands     Transfers Able to transfer safely, definite need of hands     Standing Unsupported with Eyes Closed Unable to keep eyes closed 3 seconds but stays steady     Standing  Unsupported with Feet Together Needs help to attain position but able to stand for 30 seconds with feet together     From Standing, Reach Forward with Outstretched Arm Reaches forward but  needs supervision     From Standing Position, Pick up Object from Floor Unable to try/needs assist to keep balance     From Standing Position, Turn to Look Behind Over each Shoulder Needs supervision when turning     Turn 360 Degrees Needs assistance while turning     Standing Unsupported, Alternately Place Feet on Step/Stool Needs assistance to keep from falling or unable to try     Standing Unsupported, One Foot in [place] help to step but can hold 15 seconds     Standing on One Leg Tries to lift leg/unable to hold 3 seconds but remains standing independently     Total Score 20     Berg comment: BERG  < 36 high risk for falls (close to 100%) 46-51 moderate (>50%)   37-45 significant (>80%) 52-55 lower (> 25%)                   CURRENT PROSTHETIC WEAR ASSESSMENT: 06/30/2022 Patient is dependent with: skin check, residual limb care, prosthetic cleaning, ply sock cleaning, correct ply sock adjustment, proper wear schedule/adjustment, and proper weight-bearing schedule/adjustment Donning prosthesis: SBA Doffing prosthesis: SBA Prosthetic wear tolerance: 2 hours, 2x/day, 5 days since delivery Prosthetic weight bearing tolerance: 5 minutes with residual limb pain 3-4/10 Edema: pitting Residual limb condition:  scab medially 25m and dry scab lateral scar 3660m  Good hair growth, normal color & temperature.  Cylindrical shape Prosthetic description: silicon liner with pin lock suspension, total contact socket, dynamic response foot K code/activity level with prosthetic use: Level 3     TODAY'S TREATMENT:  07/29/2022 Prosthetic Training with TTA prosthesis 60m44m 5mm49mich appears slightly smaller & less redness / irritation with use of Tegaderm.  He continues with U-shaped pad around problematic area.   Pt donned prosthesis correctly.  Prosthetist appt: ask if 3X shrinker is correct size,  pre-tibial pads? &/or grind distal tibia area of socket and if needs new socket ask if pin vac system would work.  Stop use of U padding but cont Tegaderm until wound heals.  Pt verbalized understanding. Continue floor transfers using BUEs to push up. Pt verbalized understanding. Use RW to limit weight until pain / wound issue resolved.  Neuromuscular Re-ed: Simulated casting fishing rod and reeling in fish with //bars to catch balance intermittently.  PT demo & verbal cues on set-up for home practice with chair behind & RW in front. Pt verbalized understanding.  Sit to/from stand from 24" bar stool without UE assist including stabilization 10 reps.  Stop squats that he reported doing until pain issue resolved.  Standing hip width on foam eyes open & on floor eyes closed head motions right/left, up/down & diagonals with intermittent touch //bars. PT demo & verbal cues how to perform in corner with RW. Pt verbalized understanding.     07/27/2022 Prosthetic Training with TTA prosthesis Residual limb has small bleeding tear where sticky pad was pulled off. PT applied Tegaderm over wound on residual limb and reapplied sticky pad with explanation to prevent skin breakdown, pt verbalized understanding. Prosthetist suggested limiting wear until appointment, PT advised limiting weight bearing and using the walker more until appointment. Pt verbalized understanding. PT advised walking without AD only within 1 room or to next room, cane for longer distances in house, walker for use outside of home. Pt verbalized understanding  Neuro Re-ed: Standing hip width on floor eyes closed head motions right/left, up/down & diagonals with intermittent touch //bars Standing hip width  crossways on foam beam eyes open head motions right/left, up/down & diagonals with frequent touch //bars - visual and verbal cueing for even  WB Anterior reach 8" without UE support Pt looked over each shoulder, required UE support with turn to the Lt Pt picked up item from floor without UE assistance  07/22/2022 Neuromuscular Re-ed: Standing hip width stance crossways on foam beam with visual, verbal & tactile cues on balance reactions & even weight on BLEs  -static eyes open up to 5 sec  -eyes open head motions right/left, up/down & diagonals with minA & intermittent touch //bars  -static eyes closed up to 3 sec  Standing hip width on floor eyes closed head motions right/left, up/down & diagonals with intermittent touch //bars  Rocker board ant/level/post & right/level/left with light BUE support with focus on weight shift & knee control.  Sit to/from stand from 24" bar stool 5 reps with hands on thighs & 5 reps without UE support.  Prosthetic Training with TTA prosthesis: Continue wear 5 hrs on, 1 hr off, most of awake hours.  PT recommended changing & washing liner early evening.  Pt verbalized understanding.  Pt amb 150' with cane working on scanning right/left and up/down with minA for balance.  Pt amb 150' with cane working on scanning up-right/down-left and up-left/down-right with minA for balance.     HOME EXERCISE PROGRAM:  Access Code: [email or web address] URL: [email or web address] Date: 07/20/2022 Prepared by: Jamey Reas  Exercises - Seated Quad Set  - 3-5 x daily - 7 x weekly - 2-3 sets - 10-20 reps - 5 seconds hold - Straight Leg Raise with External Rotation  - 3-5 x daily - 7 x weekly - 2-3 sets - 10-20 reps - 5 seconds hold   Do each exercise 1-2  times per day Do each exercise 5-10 repetitions Hold each exercise for 2 seconds to feel your location  AT [place].  Try to find this position when standing still for activities.   USE TAPE ON FLOOR TO MARK THE MIDLINE POSITION which is even with middle of sink.  You also  should try to feel with your limb pressure in socket.  You are trying to feel with limb what you used to feel with the bottom of your foot.  Side to Side Shift: Moving your hips only (not shoulders): move weight onto your left leg, HOLD/FEEL pressure in socket.  Move back to equal weight on each leg, HOLD/FEEL pressure in socket. Move weight onto your right leg, HOLD/FEEL pressure in socket. Move back to equal weight on each leg, HOLD/FEEL pressure in socket. Repeat.  Start with both hands on sink, progress to hand on prosthetic side only, then no hands.  Front to Back Shift: Moving your hips only (not shoulders): move your weight forward onto your toes, HOLD/FEEL pressure in socket. Move your weight back to equal Flat Foot on both legs, HOLD/FEEL  pressure in socket. Move your weight back onto your heels, HOLD/FEEL  pressure in socket. Move your weight back to equal on both legs, HOLD/FEEL  pressure in socket. Repeat.  Start with both hands on sink, progress to hand on prosthetic side only, then no hands.  Moving Cones / Cups: With equal weight on each leg: Hold on with one hand the first time, then progress to no hand supports. Move cups from one side of sink to the other. Place cups ~2" out of your reach, progress  to 10" beyond reach.  Place one hand in middle of sink and reach with other hand. Do both arms.  Then hover one hand and move cups with other hand.  Overhead/Upward Reaching: alternated reaching up to top cabinets or ceiling if no cabinets present. Keep equal weight on each leg. Start with one hand support on counter while other hand reaches and progress to no hand support with reaching.  ace one hand in middle of sink and reach with other hand. Do both arms.  Then hover one hand and move cups with other hand.  5.   Looking Over Shoulders: With equal weight on each leg: alternate turning to look over your shoulders with one hand support on counter as needed.  Start with head motions only to look  in front of shoulder, then even with shoulder and progress to looking behind you. To look to side, move head /eyes, then shoulder on side looking pulls back, shift more weight to side looking and pull hip back. Place one hand in middle of sink and let go with other hand so your shoulder can pull back. Switch hands to look other way.   Then hover one hand and look over shoulder. If looking right, use left hand at sink. If looking left, use right hand at sink. 6.  Stepping with leg that is not amputated:  Move items under cabinet out of your way. Shift your hips/pelvis so weight on prosthesis. Tighten muscles in hip on prosthetic side.  SLOWLY step other leg so front of foot is in cabinet. Then step back to floor.       ASSESSMENT:   CLINICAL IMPRESSION: Patient's wound & distal tibia pain seem to slightly better with limited weight bearing & wear and will benefit from prosthesis modifications.  His balance is improving and he seems to understand home activities with safe set-up to work on it.  Pt continues to benefit from skilled PT.    OBJECTIVE IMPAIRMENTS Abnormal gait, cardiopulmonary status limiting activity, decreased activity tolerance, decreased balance, decreased endurance, decreased knowledge of condition, decreased knowledge of use of DME, decreased mobility, difficulty walking, decreased strength, increased edema, postural dysfunction, prosthetic dependency , obesity, pain, and skin integrity issues on residual limb .    ACTIVITY LIMITATIONS carrying, lifting, bending, sitting, standing, squatting, stairs, transfers, locomotion level, and safe prosthesis use   PARTICIPATION LIMITATIONS: driving, community activity, and recreational activities   [place], Time since onset of injury/illness/exacerbation, and 3+ comorbidities: see PMH  are also affecting patient's functional outcome.    REHAB POTENTIAL: Good   CLINICAL DECISION MAKING: Evolving/moderate complexity    EVALUATION COMPLEXITY: Moderate     GOALS: Goals reviewed with patient? Yes   SHORT TERM GOALS: Target date: 07/29/2022   Patient donnes prosthesis modified independent & verbalizes proper cleaning. Baseline: SEE OBJECTIVE DATA Goal status: met 07/27/22 2.  Patient tolerates prosthesis >12 hrs total /day without increase in skin issues or limb pain </= 2/10 after standing. Baseline: SEE OBJECTIVE DATA Goal status: ongoing 07/29/22   3.  Patient able to reach 10", pick up items from floor and look over both shoulders without UE support with supervision. Baseline: SEE OBJECTIVE DATA Goal status: met 07/29/22   4. Patient ambulates 200' with cane & prosthesis with supervision. Baseline: SEE OBJECTIVE DATA Goal status: ongoing 07/29/22 as limiting weight bearing due to pain & wound. Safe with RW.   5. Patient negotiates ramps & curbs with cane & prosthesis with minA. Baseline: SEE  OBJECTIVE DATA Goal status: ongoing 07/29/22 as limiting weight bearing due to pain & wound. Safe with RW.     LONG TERM GOALS: Target date: 09/24/2022   Patient demonstrates & verbalized understanding of prosthetic care to enable safe utilization of prosthesis. Baseline: SEE OBJECTIVE DATA Goal status: INITIAL   Patient tolerates prosthesis wear >90% of awake hours without skin or limb pain issues. Baseline: SEE OBJECTIVE DATA Goal status: INITIAL   Berg Balance >/= 40/56  to indicate lower fall risk Baseline: SEE OBJECTIVE DATA Goal status: INITIAL   Patient ambulates >500' with prosthesis & cane or less independently Baseline: SEE OBJECTIVE DATA Goal status: INITIAL   Patient negotiates ramps, curbs & stairs with single rail with prosthesis cane or less independently. Baseline: SEE OBJECTIVE DATA Goal status: INITIAL   Patient demonstrates & verbalizes climbing ladder, lifting, carrying, pushing, pulling with prosthesis safely. Baseline: SEE OBJECTIVE DATA Goal status: INITIAL   Patient  verbalizes & demonstrates understanding of fishing simulations.  Baseline: SEE OBJECTIVE DATA Goal status: INITIAL   PLAN: PT FREQUENCY: 2x/week   PT DURATION: 90 days / 13 weeks   PLANNED INTERVENTIONS: Therapeutic exercises, Therapeutic activity, Neuromuscular re-education, Balance training, Gait training, Patient/Family education, Stair training, Vestibular training, Prosthetic training, DME instructions, and Physical Performance Testing   PLAN FOR NEXT SESSION: check on prosthetist appt & limb wound,  continue prosthetic gait with cane if able for weight bearing including ramp & curb and standing balance including compliant surfaces  Jamey Reas, PT, DPT 07/29/2022, 12:47 PM

## 2022-07-31 DIAGNOSIS — I255 Ischemic cardiomyopathy: Secondary | ICD-10-CM | POA: Diagnosis not present

## 2022-07-31 DIAGNOSIS — T8743 Infection of amputation stump, right lower extremity: Secondary | ICD-10-CM | POA: Diagnosis not present

## 2022-07-31 DIAGNOSIS — Z89511 Acquired absence of right leg below knee: Secondary | ICD-10-CM | POA: Diagnosis not present

## 2022-08-03 ENCOUNTER — Ambulatory Visit (INDEPENDENT_AMBULATORY_CARE_PROVIDER_SITE_OTHER): Payer: Medicare Other | Admitting: Physical Therapy

## 2022-08-03 ENCOUNTER — Encounter: Payer: Self-pay | Admitting: Physical Therapy

## 2022-08-03 DIAGNOSIS — M6281 Muscle weakness (generalized): Secondary | ICD-10-CM

## 2022-08-03 DIAGNOSIS — R293 Abnormal posture: Secondary | ICD-10-CM

## 2022-08-03 DIAGNOSIS — R2681 Unsteadiness on feet: Secondary | ICD-10-CM | POA: Diagnosis not present

## 2022-08-03 DIAGNOSIS — M79661 Pain in right lower leg: Secondary | ICD-10-CM | POA: Diagnosis not present

## 2022-08-03 DIAGNOSIS — R2689 Other abnormalities of gait and mobility: Secondary | ICD-10-CM | POA: Diagnosis not present

## 2022-08-03 NOTE — Therapy (Signed)
OUTPATIENT PHYSICAL THERAPY TREATMENT NOTE   Patient Name: Eduardo [name] MRN: [id number] DOB:[birth year], 66 y.o., male Today's Date: 08/03/2022  PCP: Rita Ohara, MD REFERRING PROVIDER: Newt Minion, MD  END OF SESSION:   PT End of Session - 08/03/22 1102     Visit Number 8    Number of Visits 26    Date for PT Re-Evaluation 09/24/22    Authorization Type [employer] Medicare    Authorization Time Period $20 copay    Progress Note Due on Visit 10    PT Start Time 1100    PT Stop Time 1142    PT Time Calculation (min) 42 min    Equipment Utilized During Treatment Gait belt    Activity Tolerance Patient tolerated treatment well;Patient limited by pain    Behavior During Therapy WFL for tasks assessed/performed               Past Medical History:  Diagnosis Date   CHF (congestive heart failure) ([place])    Chronic kidney disease    stage 3   Colon polyp    Coronary artery disease    Diabetes mellitus 1987   under care of Dr. Chalmers Cater.  On insulin since 96 (off and on)   Diabetic retinopathy    Dupuytren contracture    R hand, s/p injection (Dr. Lenon Curt)   Essential hypertension, benign    Essential hypertension, benign 02/06/2019   Frequency of urination and polyuria    Hypertension    Myocardial infarction [facility])    denies   Neuromuscular disorder ([place])    Diabetic neuropathy   Osteomyelitis ([place])    right foot   Other testicular hypofunction    Peripheral arterial disease ([place]) 10/28/2012   Peritoneal abscess ([place]) 6/08   and buttock.   Pneumonia    Polydipsia    Proteinuria    Pure hyperglyceridemia    Subacute osteomyelitis, right ankle and foot ([place])    Wears glasses    Past Surgical History:  Procedure Laterality Date   ABDOMINAL AORTAGRAM N/A 04/18/2012   Procedure: ABDOMINAL Maxcine Ham;  Surgeon: Angelia Mould, MD;  Location: [facility] CATH LAB;  Service: Cardiovascular;  Laterality: N/A;   AMPUTATION Right 05/19/2019   Procedure: RIGHT FOOT 5TH RAY  AMPUTATION;  Surgeon: Newt Minion, MD;  Location: [place];  Service: Orthopedics;  Laterality: Right;   AMPUTATION Right 03/11/2022   Procedure: RIGHT LEG DEBRIDEMENT VS. BELOW KNEE AMPUTATION;  Surgeon: Newt Minion, MD;  Location: [place];  Service: Orthopedics;  Laterality: Right;   CARDIAC CATHETERIZATION N/A 09/08/2016   Procedure: Left Heart Cath and Coronary Angiography;  Surgeon: Adrian Prows, MD;  Location: [place] CV LAB;  Service: Cardiovascular;  Laterality: N/A;   CATARACT EXTRACTION, BILATERAL  09/2017, 10/2017   Dr. Herbert Deaner   COLONOSCOPY W/ BIOPSIES AND POLYPECTOMY     CORONARY/GRAFT ACUTE MI REVASCULARIZATION N/A 10/11/2020   Procedure: Coronary/Graft Acute MI Revascularization;  Surgeon: Adrian Prows, MD;  Location: [place] CV LAB;  Service: Cardiovascular;  Laterality: N/A;   I & D EXTREMITY Right 03/11/2022   Procedure: BELOW KNEE AMPUTATION;  Surgeon: Newt Minion, MD;  Location: [place];  Service: Orthopedics;  Laterality: Right;   LEFT HEART CATH N/A 12/31/2020   Procedure: Left Heart Cath;  Surgeon: Adrian Prows, MD;  Location: [place] CV LAB;  Service: Cardiovascular;  Laterality: N/A;   LEFT HEART CATH AND CORONARY ANGIOGRAPHY N/A 10/11/2020   Procedure: LEFT HEART CATH AND CORONARY  ANGIOGRAPHY;  Surgeon: Adrian Prows, MD;  Location: [place] CV LAB;  Service: Cardiovascular;  Laterality: N/A;   LOWER EXTREMITY ANGIOGRAM Bilateral 04/18/2012   Procedure: LOWER EXTREMITY ANGIOGRAM;  Surgeon: Angelia Mould, MD;  Location: [facility] CATH LAB;  Service: Cardiovascular;  Laterality: Bilateral;  bilat lower extrem angio   LOWER EXTREMITY ANGIOGRAPHY Bilateral 05/02/2019   Procedure: LOWER EXTREMITY ANGIOGRAPHY;  Surgeon: Adrian Prows, MD;  Location: [place] [facility] [place] CV LAB;  Service: Cardiovascular;  Laterality: Bilateral;   LOWER EXTREMITY ANGIOGRAPHY Bilateral 02/28/2019   Procedure: LOWER EXTREMITY ANGIOGRAPHY;  Surgeon: Adrian Prows, MD;  Location: [place] CV LAB;  Service:  Cardiovascular;  Laterality: Bilateral;   macular photocoagulation     (eye treatments for diabetic retinopathy)-Dr. Zigmund Daniel   PERIPHERAL VASCULAR INTERVENTION  02/28/2019   Procedure: PERIPHERAL VASCULAR INTERVENTION;  Surgeon: Adrian Prows, MD;  Location: [place] CV LAB;  Service: Cardiovascular;;   TEE WITHOUT CARDIOVERSION N/A 03/18/2022   Procedure: TRANSESOPHAGEAL ECHOCARDIOGRAM (TEE);  Surgeon: Adrian Prows, MD;  [facility] ENDOSCOPY;  Service: Cardiovascular;  Laterality: N/A;   VENTRICULAR ASSIST DEVICE INSERTION N/A 12/31/2020   Procedure: VENTRICULAR ASSIST DEVICE INSERTION;  Surgeon: Adrian Prows, MD;  Location: [place] CV LAB;  Service: Cardiovascular;  Laterality: N/A;   Patient Active Problem List   Diagnosis Date Noted   Chronic right-sided congestive heart failure ([place]) 05/18/2022   Ejection fraction < 50% 04/17/2022   Insomnia    Constipation    S/P BKA (below knee amputation), right ([place]) 03/21/2022   Ischemic cardiomyopathy    AKI (acute kidney injury) ([place]) 03/13/2022   Streptococcal bacteremia 03/13/2022   Hypophosphatemia 03/13/2022   Septic shock ([place]) 00/86/7619   Acute systolic heart failure ([place]) 12/28/2020   Acute pulmonary edema ([place]) 10/11/2020   Non-ST elevation (NSTEMI) myocardial infarction ([place]) 10/11/2020   Acute respiratory distress 10/11/2020   Acute on chronic combined systolic and diastolic CHF (congestive heart failure) ([place]) 10/11/2020   Cardiogenic shock (HCC)    Partial nontraumatic amputation of foot, right ([place]) 06/21/2019   Osteomyelitis of right foot ([place])    Essential hypertension, benign 02/06/2019   Hypercholesteremia 02/06/2019   Stage 3b chronic kidney disease (CKD) ([place]) 10/06/2017   Proteinuria 10/06/2017   Controlled type 2 diabetes mellitus with hyperglycemia, with long-term current use of insulin ([place]) 10/06/2017   Coronary artery disease involving native coronary artery of native heart without angina pectoris 05/23/2017    Abnormal nuclear stress test 09/06/2016   PAD (peripheral artery disease) ([place]) 10/28/2012   Morbid obesity with BMI of 40.0-44.9, adult ([place]) 10/13/2012   Diabetes mellitus with ophthalmic complication ([place]) 50/93/2671   Hypertension associated with diabetes ([place]) 10/13/2012   Atherosclerosis of native artery of extremity with intermittent claudication ([place]) 04/05/2012   Adenomatous colon polyp 09/28/2011   Diabetes mellitus ([place]) 09/28/2011   Pure hyperglyceridemia 09/28/2011   Erectile dysfunction 09/28/2011   Diabetic retinopathy ([place]) 09/28/2011    REFERRING DIAG: Z89.511 (ICD-10-CM) - S/P below knee amputation, right  ONSET DATE: 06/25/2022 prosthesis delivery  THERAPY DIAG:  Other abnormalities of gait and mobility  Unsteadiness on feet  Muscle weakness (generalized)  Abnormal posture  Pain in right lower leg  Rationale for Evaluation and Treatment Rehabilitation  PERTINENT HISTORY: combined systolic & diastolic CHF/cardiomyopathy EF 15-20%, DM2, PVD/PAD, CAD, CABG 2017, HTN, HLD, morbid obesity, CKD st3b, diabetic retinopathy  PRECAUTIONS: None  SUBJECTIVE:  He saw Willette Alma, [facility] on Thursday who added pads & ground distal tibia area.  His limb feels  better.  He is wearing prosthesis 2 hours on 2 hours off for 8 hours total and thinks he can increase now.  PAIN:  Are you having pain? 0.5/10 today, since Friday highest 1/10   OBJECTIVE: (objective measures completed at initial evaluation unless otherwise dated)  OBJECTIVE:    COGNITION: 06/30/2022  Overall cognitive status: Within functional limits for tasks assessed   POSTURE: 06/30/2022  rounded shoulders, forward head, and weight shift left   LOWER EXTREMITY ROM:   ROM P:passive  A:active Right Eval 06/30/2022 Left eval  Hip flexion      Hip extension      Hip abduction      Hip adduction      Hip internal rotation      Hip external rotation      Knee flexion      Knee extension 0*    Ankle  dorsiflexion      Ankle plantarflexion      Ankle inversion      Ankle eversion       (Blank rows = not tested)   LOWER EXTREMITY MMT:   MMT Right Eval 06/30/2022 Left eval  Hip flexion      Hip extension 4/5    Hip abduction 4/5    Hip adduction      Hip internal rotation      Hip external rotation      Knee flexion 4/5    Knee extension 4/5    Ankle dorsiflexion NA    Ankle plantarflexion NA    Ankle inversion NA    Ankle eversion NA    Evaluation testing functionally (Blank rows = not tested)   TRANSFERS: 06/30/2022  Sit to stand: SBA from chairs with armrests using back of LLE against chair to stabilize 06/30/2022   Stand to sit:  SBA using UEs to control descent   GAIT: 07/20/2022 Pt ambulates with step-through pattern with RW and Rt medial whip  06/30/2022 Gait pattern: step to pattern, decreased step length- Left, decreased stance time- Right, decreased stride length, decreased hip/knee flexion- Right, Right hip hike, antalgic, and abducted- Right Distance walked: 100' Assistive device utilized: Environmental consultant - 2 wheeled and TTA prosthesis Level of assistance: SBA  needs cues for gait deviations / no assist physically with RW support Comments: excessive UE weight bearing on RW to limit weight on prosthesis.    FUNCTIONAL TESTs:   Merrilee Jansky Balance Scale: 06/30/2022:   20/56   [facility] PT Assessment - 06/30/22 1345                Standardized Balance Assessment    Standardized Balance Assessment Berg Balance Test          Berg Balance Test    Sit to Stand Needs minimal aid to stand or to stabilize     Standing Unsupported Able to stand 2 minutes with supervision     Sitting with Back Unsupported but Feet Supported on Floor or Stool Able to sit safely and securely 2 minutes     Stand to Sit Controls descent by using hands     Transfers Able to transfer safely, definite need of hands     Standing Unsupported with Eyes Closed Unable to keep eyes closed 3 seconds but stays steady      Standing Unsupported with Feet Together Needs help to attain position but able to stand for 30 seconds with feet together     From Standing, Reach Forward with Outstretched Arm Reaches forward but  needs supervision     From Standing Position, Pick up Object from Floor Unable to try/needs assist to keep balance     From Standing Position, Turn to Look Behind Over each Shoulder Needs supervision when turning     Turn 360 Degrees Needs assistance while turning     Standing Unsupported, Alternately Place Feet on Step/Stool Needs assistance to keep from falling or unable to try     Standing Unsupported, One Foot in [place] help to step but can hold 15 seconds     Standing on One Leg Tries to lift leg/unable to hold 3 seconds but remains standing independently     Total Score 20     Berg comment: BERG  < 36 high risk for falls (close to 100%) 46-51 moderate (>50%)   37-45 significant (>80%) 52-55 lower (> 25%)                   CURRENT PROSTHETIC WEAR ASSESSMENT: 06/30/2022 Patient is dependent with: skin check, residual limb care, prosthetic cleaning, ply sock cleaning, correct ply sock adjustment, proper wear schedule/adjustment, and proper weight-bearing schedule/adjustment Donning prosthesis: SBA Doffing prosthesis: SBA Prosthetic wear tolerance: 2 hours, 2x/day, 5 days since delivery Prosthetic weight bearing tolerance: 5 minutes with residual limb pain 3-4/10 Edema: pitting Residual limb condition:  scab medially 74m and dry scab lateral scar 318m  Good hair growth, normal color & temperature.  Cylindrical shape Prosthetic description: silicon liner with pin lock suspension, total contact socket, dynamic response foot K code/activity level with prosthetic use: Level 3     TODAY'S TREATMENT:  08/03/2022 Prosthetic Training with TTA prosthesis Wound 80m97m 2mm98mth Tegaderm covering. No signs of infection.  PT recommended increasing wear to 3 hours on, 2 hours off, during awake  hours.  Continue Tegaderm until wound heals.   Pt amb 300' with cane stand alone tip with supervision working on maintaining path & pace with scanning right/left, up/down & diagonals. Pt neg ramp & curb with cane with PT demo & verbal cues with supervision. Step up & step down in //bars BUE support leading with BLEs 10 reps ea with 6" step. PT demo & verbal cues on technique.  Stairs with 2 rails: PT demo & verbal cues on technique - descend step-to pattern but alt lead LE.  Ascend alternating pattern.    07/29/2022 Prosthetic Training with TTA prosthesis  3mm 52mmm w72mh appears slightly smaller & less redness / irritation with use of Tegaderm.  He continues with U-shaped pad around problematic area.  Pt donned prosthesis correctly.  Prosthetist appt: ask if 3X shrinker is correct size,  pre-tibial pads? &/or grind distal tibia area of socket and if needs new socket ask if pin vac system would work.  Stop use of U padding but cont Tegaderm until wound heals.  Pt verbalized understanding. Continue floor transfers using BUEs to push up. Pt verbalized understanding. Use RW to limit weight until pain / wound issue resolved.  Neuromuscular Re-ed: Simulated casting fishing rod and reeling in fish with //bars to catch balance intermittently.  PT demo & verbal cues on set-up for home practice with chair behind & RW in front. Pt verbalized understanding.  Sit to/from stand from 24" bar stool without UE assist including stabilization 10 reps.  Stop squats that he reported doing until pain issue resolved.  Standing hip width on foam eyes open & on floor eyes closed head motions right/left, up/down & diagonals with intermittent touch //bars.  PT demo & verbal cues how to perform in corner with RW. Pt verbalized understanding.     07/27/2022 Prosthetic Training with TTA prosthesis Residual limb has small bleeding tear where sticky pad was pulled off. PT applied Tegaderm over wound on residual limb and  reapplied sticky pad with explanation to prevent skin breakdown, pt verbalized understanding. Prosthetist suggested limiting wear until appointment, PT advised limiting weight bearing and using the walker more until appointment. Pt verbalized understanding. PT advised walking without AD only within 1 room or to next room, cane for longer distances in house, walker for use outside of home. Pt verbalized understanding  Neuro Re-ed: Standing hip width on floor eyes closed head motions right/left, up/down & diagonals with intermittent touch //bars Standing hip width crossways on foam beam eyes open head motions right/left, up/down & diagonals with frequent touch //bars - visual and verbal cueing for even WB Anterior reach 8" without UE support Pt looked over each shoulder, required UE support with turn to the Lt Pt picked up item from floor without UE assistance    HOME EXERCISE PROGRAM:  Access Code: [email or web address] URL: [email or web address] Date: 07/20/2022 Prepared by: Jamey Reas  Exercises - Seated Quad Set  - 3-5 x daily - 7 x weekly - 2-3 sets - 10-20 reps - 5 seconds hold - Straight Leg Raise with External Rotation  - 3-5 x daily - 7 x weekly - 2-3 sets - 10-20 reps - 5 seconds hold   Do each exercise 1-2  times per day Do each exercise 5-10 repetitions Hold each exercise for 2 seconds to feel your location  AT [place].  Try to find this position when standing still for activities.   USE TAPE ON FLOOR TO MARK THE MIDLINE POSITION which is even with middle of sink.  You also should try to feel with your limb pressure in socket.  You are trying to feel with limb what you used to feel with the bottom of your foot.  Side to Side Shift: Moving your hips only (not shoulders): move weight onto your left leg, HOLD/FEEL pressure in socket.  Move back to equal weight on each leg, HOLD/FEEL pressure in socket.  Move weight onto your right leg, HOLD/FEEL pressure in socket. Move back to equal weight on each leg, HOLD/FEEL pressure in socket. Repeat.  Start with both hands on sink, progress to hand on prosthetic side only, then no hands.  Front to Back Shift: Moving your hips only (not shoulders): move your weight forward onto your toes, HOLD/FEEL pressure in socket. Move your weight back to equal Flat Foot on both legs, HOLD/FEEL  pressure in socket. Move your weight back onto your heels, HOLD/FEEL  pressure in socket. Move your weight back to equal on both legs, HOLD/FEEL  pressure in socket. Repeat.  Start with both hands on sink, progress to hand on prosthetic side only, then no hands.  Moving Cones / Cups: With equal weight on each leg: Hold on with one hand the first time, then progress to no hand supports. Move cups from one side of sink to the other. Place cups ~2" out of your reach, progress to 10" beyond reach.  Place one hand in middle of sink and reach with other hand. Do both arms.  Then hover one hand and move cups with other hand.  Overhead/Upward Reaching: alternated reaching up to top cabinets or ceiling if no cabinets present. Keep  equal weight on each leg. Start with one hand support on counter while other hand reaches and progress to no hand support with reaching.  ace one hand in middle of sink and reach with other hand. Do both arms.  Then hover one hand and move cups with other hand.  5.   Looking Over Shoulders: With equal weight on each leg: alternate turning to look over your shoulders with one hand support on counter as needed.  Start with head motions only to look in front of shoulder, then even with shoulder and progress to looking behind you. To look to side, move head /eyes, then shoulder on side looking pulls back, shift more weight to side looking and pull hip back. Place one hand in middle of sink and let go with other hand so your shoulder can pull back. Switch hands to look other way.    Then hover one hand and look over shoulder. If looking right, use left hand at sink. If looking left, use right hand at sink. 6.  Stepping with leg that is not amputated:  Move items under cabinet out of your way. Shift your hips/pelvis so weight on prosthesis. Tighten muscles in hip on prosthetic side.  SLOWLY step other leg so front of foot is in cabinet. Then step back to floor.       ASSESSMENT:   CLINICAL IMPRESSION: Patient's wound continues to heal and changes prosthetist made has improved pain significantly.  PT was able to progress his activity level & balance during session.  He has better understanding for neg ramps, curbs with cane & stairs with 2 rails.  He continues to benefit from skilled PT to improve function & safety.     OBJECTIVE IMPAIRMENTS Abnormal gait, cardiopulmonary status limiting activity, decreased activity tolerance, decreased balance, decreased endurance, decreased knowledge of condition, decreased knowledge of use of DME, decreased mobility, difficulty walking, decreased strength, increased edema, postural dysfunction, prosthetic dependency , obesity, pain, and skin integrity issues on residual limb .    ACTIVITY LIMITATIONS carrying, lifting, bending, sitting, standing, squatting, stairs, transfers, locomotion level, and safe prosthesis use   PARTICIPATION LIMITATIONS: driving, community activity, and recreational activities   Pardue, Time since onset of injury/illness/exacerbation, and 3+ comorbidities: see PMH  are also affecting patient's functional outcome.    REHAB POTENTIAL: Good   CLINICAL DECISION MAKING: Evolving/moderate complexity   EVALUATION COMPLEXITY: Moderate     GOALS: Goals reviewed with patient? Yes   SHORT TERM GOALS: Target date: 08/27/2022   Patient verbalizes adjusting ply socks & sweat management.  Baseline: SEE OBJECTIVE DATA Goal status: ongoing 08/03/2022 2.  Patient tolerates prosthesis >12 hrs total /day  with no skin issues or limb pain after standing. Baseline: SEE OBJECTIVE DATA Goal status: ongoing 08/03/2022   3.  Berg Balance Test >/= 36/56 Baseline: SEE OBJECTIVE DATA Goal status: ongoing 08/03/2022   4. Patient ambulates 200' with cane & prosthesis with supervision. Baseline: SEE OBJECTIVE DATA Goal status: ongoing 08/03/2022   5. Patient negotiates ramps & curbs with cane & prosthesis with minA. Baseline: SEE OBJECTIVE DATA Goal status:  ongoing 08/03/2022     LONG TERM GOALS: Target date: 09/24/2022   Patient demonstrates & verbalized understanding of prosthetic care to enable safe utilization of prosthesis. Baseline: SEE OBJECTIVE DATA Goal status: INITIAL   Patient tolerates prosthesis wear >90% of awake hours without skin or limb pain issues. Baseline: SEE OBJECTIVE DATA Goal status: INITIAL   Berg Balance >/= 40/56  to indicate lower fall risk Baseline: SEE OBJECTIVE DATA Goal status: INITIAL   Patient ambulates >500' with prosthesis & cane or less independently Baseline: SEE OBJECTIVE DATA Goal status: INITIAL   Patient negotiates ramps, curbs & stairs with single rail with prosthesis cane or less independently. Baseline: SEE OBJECTIVE DATA Goal status: INITIAL   Patient demonstrates & verbalizes climbing ladder, lifting, carrying, pushing, pulling with prosthesis safely. Baseline: SEE OBJECTIVE DATA Goal status: INITIAL   Patient verbalizes & demonstrates understanding of fishing simulations.  Baseline: SEE OBJECTIVE DATA Goal status: INITIAL   PLAN: PT FREQUENCY: 2x/week   PT DURATION: 90 days / 13 weeks   PLANNED INTERVENTIONS: Therapeutic exercises, Therapeutic activity, Neuromuscular re-education, Balance training, Gait training, Patient/Family education, Stair training, Vestibular training, Prosthetic training, DME instructions, and Physical Performance Testing   PLAN FOR NEXT SESSION:   continue prosthetic gait with cane including ramp & curb for  community function & no device for household.  Standing balance including compliant surfaces  Jamey Reas, PT, DPT 08/03/2022, 5:36 PM

## 2022-08-05 ENCOUNTER — Encounter: Payer: Self-pay | Admitting: Physical Therapy

## 2022-08-05 ENCOUNTER — Ambulatory Visit (INDEPENDENT_AMBULATORY_CARE_PROVIDER_SITE_OTHER): Payer: Medicare Other | Admitting: Physical Therapy

## 2022-08-05 DIAGNOSIS — R2681 Unsteadiness on feet: Secondary | ICD-10-CM

## 2022-08-05 DIAGNOSIS — R2689 Other abnormalities of gait and mobility: Secondary | ICD-10-CM

## 2022-08-05 DIAGNOSIS — M79661 Pain in right lower leg: Secondary | ICD-10-CM

## 2022-08-05 DIAGNOSIS — R293 Abnormal posture: Secondary | ICD-10-CM | POA: Diagnosis not present

## 2022-08-05 DIAGNOSIS — M6281 Muscle weakness (generalized): Secondary | ICD-10-CM

## 2022-08-05 NOTE — Therapy (Signed)
OUTPATIENT PHYSICAL THERAPY TREATMENT NOTE   Patient Name: Eduardo [name] MRN: [id number] DOB:03-08-1956, 66 y.o., male Today's Date: 08/05/2022  PCP: Rita Ohara, MD REFERRING PROVIDER: Newt Minion, MD  END OF SESSION:   PT End of Session - 08/05/22 1105     Visit Number 9    Number of Visits 26    Date for PT Re-Evaluation 09/24/22    Authorization Type [employer] Medicare    Authorization Time Period $20 copay    Progress Note Due on Visit 10    PT Start Time 1100    PT Stop Time 1142    PT Time Calculation (min) 42 min    Equipment Utilized During Treatment Gait belt    Activity Tolerance Patient tolerated treatment well;Patient limited by pain    Behavior During Therapy WFL for tasks assessed/performed                Past Medical History:  Diagnosis Date   CHF (congestive heart failure) ([place])    Chronic kidney disease    stage 3   Colon polyp    Coronary artery disease    Diabetes mellitus 1987   under care of Dr. Chalmers Cater.  On insulin since 96 (off and on)   Diabetic retinopathy    Dupuytren contracture    R hand, s/p injection (Dr. Lenon Curt)   Essential hypertension, benign    Essential hypertension, benign 02/06/2019   Frequency of urination and polyuria    Hypertension    Myocardial infarction [facility])    denies   Neuromuscular disorder ([place])    Diabetic neuropathy   Osteomyelitis ([place])    right foot   Other testicular hypofunction    Peripheral arterial disease ([place]) 10/28/2012   Peritoneal abscess ([place]) 6/08   and buttock.   Pneumonia    Polydipsia    Proteinuria    Pure hyperglyceridemia    Subacute osteomyelitis, right ankle and foot ([place])    Wears glasses    Past Surgical History:  Procedure Laterality Date   ABDOMINAL AORTAGRAM N/A 04/18/2012   Procedure: ABDOMINAL Maxcine Ham;  Surgeon: Angelia Mould, MD;  Location: [facility] CATH LAB;  Service: Cardiovascular;  Laterality: N/A;   AMPUTATION Right 05/19/2019   Procedure: RIGHT FOOT 5TH RAY  AMPUTATION;  Surgeon: Newt Minion, MD;  Location: [place];  Service: Orthopedics;  Laterality: Right;   AMPUTATION Right 03/11/2022   Procedure: RIGHT LEG DEBRIDEMENT VS. BELOW KNEE AMPUTATION;  Surgeon: Newt Minion, MD;  Location: [place];  Service: Orthopedics;  Laterality: Right;   CARDIAC CATHETERIZATION N/A 09/08/2016   Procedure: Left Heart Cath and Coronary Angiography;  Surgeon: Adrian Prows, MD;  Location: [place] CV LAB;  Service: Cardiovascular;  Laterality: N/A;   CATARACT EXTRACTION, BILATERAL  09/2017, 10/2017   Dr. Herbert Deaner   COLONOSCOPY W/ BIOPSIES AND POLYPECTOMY     CORONARY/GRAFT ACUTE MI REVASCULARIZATION N/A 10/11/2020   Procedure: Coronary/Graft Acute MI Revascularization;  Surgeon: Adrian Prows, MD;  Location: [place] CV LAB;  Service: Cardiovascular;  Laterality: N/A;   I & D EXTREMITY Right 03/11/2022   Procedure: BELOW KNEE AMPUTATION;  Surgeon: Newt Minion, MD;  Location: [place];  Service: Orthopedics;  Laterality: Right;   LEFT HEART CATH N/A 12/31/2020   Procedure: Left Heart Cath;  Surgeon: Adrian Prows, MD;  Location: [place] CV LAB;  Service: Cardiovascular;  Laterality: N/A;   LEFT HEART CATH AND CORONARY ANGIOGRAPHY N/A 10/11/2020   Procedure: LEFT HEART CATH AND  CORONARY ANGIOGRAPHY;  Surgeon: Adrian Prows, MD;  Location: [place] CV LAB;  Service: Cardiovascular;  Laterality: N/A;   LOWER EXTREMITY ANGIOGRAM Bilateral 04/18/2012   Procedure: LOWER EXTREMITY ANGIOGRAM;  Surgeon: Angelia Mould, MD;  Location: [facility] CATH LAB;  Service: Cardiovascular;  Laterality: Bilateral;  bilat lower extrem angio   LOWER EXTREMITY ANGIOGRAPHY Bilateral 05/02/2019   Procedure: LOWER EXTREMITY ANGIOGRAPHY;  Surgeon: Adrian Prows, MD;  Location: [place] CV LAB;  Service: Cardiovascular;  Laterality: Bilateral;   LOWER EXTREMITY ANGIOGRAPHY Bilateral 02/28/2019   Procedure: LOWER EXTREMITY ANGIOGRAPHY;  Surgeon: Adrian Prows, MD;  Location: [place] CV LAB;  Service:  Cardiovascular;  Laterality: Bilateral;   macular photocoagulation     (eye treatments for diabetic retinopathy)-Dr. Zigmund Daniel   PERIPHERAL VASCULAR INTERVENTION  02/28/2019   Procedure: PERIPHERAL VASCULAR INTERVENTION;  Surgeon: Adrian Prows, MD;  Location: [place] CV LAB;  Service: Cardiovascular;;   TEE WITHOUT CARDIOVERSION N/A 03/18/2022   Procedure: TRANSESOPHAGEAL ECHOCARDIOGRAM (TEE);  Surgeon: Adrian Prows, MD;  Location: [facility] ENDOSCOPY;  Service: Cardiovascular;  Laterality: N/A;   VENTRICULAR ASSIST DEVICE INSERTION N/A 12/31/2020   Procedure: VENTRICULAR ASSIST DEVICE INSERTION;  Surgeon: Adrian Prows, MD;  Location: [place] CV LAB;  Service: Cardiovascular;  Laterality: N/A;   Patient Active Problem List   Diagnosis Date Noted   Chronic right-sided congestive heart failure ([place]) 05/18/2022   Ejection fraction < 50% 04/17/2022   Insomnia    Constipation    S/P BKA (below knee amputation), right ([place]) 03/21/2022   Ischemic cardiomyopathy    AKI (acute kidney injury) ([place]) 03/13/2022   Streptococcal bacteremia 03/13/2022   Hypophosphatemia 03/13/2022   Septic shock ([place]) 59/93/5701   Acute systolic heart failure ([place]) 12/28/2020   Acute pulmonary edema ([place]) 10/11/2020   Non-ST elevation (NSTEMI) myocardial infarction ([place]) 10/11/2020   Acute respiratory distress 10/11/2020   Acute on chronic combined systolic and diastolic CHF (congestive heart failure) ([place]) 10/11/2020   Cardiogenic shock (HCC)    Partial nontraumatic amputation of foot, right ([place]) 06/21/2019   Osteomyelitis of right foot ([place])    Essential hypertension, benign 02/06/2019   Hypercholesteremia 02/06/2019   Stage 3b chronic kidney disease (CKD) ([place]) 10/06/2017   Proteinuria 10/06/2017   Controlled type 2 diabetes mellitus with hyperglycemia, with long-term current use of insulin ([place]) 10/06/2017   Coronary artery disease involving native coronary artery of native heart without angina pectoris 05/23/2017    Abnormal nuclear stress test 09/06/2016   PAD (peripheral artery disease) ([place]) 10/28/2012   Morbid obesity with BMI of 40.0-44.9, adult ([place]) 10/13/2012   Diabetes mellitus with ophthalmic complication ([place]) 77/93/9030   Hypertension associated with diabetes ([place]) 10/13/2012   Atherosclerosis of native artery of extremity with intermittent claudication ([place]) 04/05/2012   Adenomatous colon polyp 09/28/2011   Diabetes mellitus ([place]) 09/28/2011   Pure hyperglyceridemia 09/28/2011   Erectile dysfunction 09/28/2011   Diabetic retinopathy ([place]) 09/28/2011    REFERRING DIAG: Z89.511 (ICD-10-CM) - S/P below knee amputation, right  ONSET DATE: 06/25/2022 prosthesis delivery  THERAPY DIAG:  Other abnormalities of gait and mobility  Unsteadiness on feet  Muscle weakness (generalized)  Abnormal posture  Pain in right lower leg  Rationale for Evaluation and Treatment Rehabilitation  PERTINENT HISTORY: combined systolic & diastolic CHF/cardiomyopathy EF 15-20%, DM2, PVD/PAD, CAD, CABG 2017, HTN, HLD, morbid obesity, CKD st3b, diabetic retinopathy  PRECAUTIONS: None  SUBJECTIVE:   He is wearing prosthesis 3 hrs on 2 hrs off for 7-8 hours total.  He is  using cane in house & gargage (single step with washer to touch).    PAIN:  Are you having pain? 0/10 today  OBJECTIVE: (objective measures completed at initial evaluation unless otherwise dated)  OBJECTIVE:    COGNITION: 06/30/2022  Overall cognitive status: Within functional limits for tasks assessed   POSTURE: 06/30/2022  rounded shoulders, forward head, and weight shift left   LOWER EXTREMITY ROM:   ROM P:passive  A:active Right Eval 06/30/2022 Left eval  Hip flexion      Hip extension      Hip abduction      Hip adduction      Hip internal rotation      Hip external rotation      Knee flexion      Knee extension 0*    Ankle dorsiflexion      Ankle plantarflexion      Ankle inversion      Ankle eversion       (Blank  rows = not tested)   LOWER EXTREMITY MMT:   MMT Right Eval 06/30/2022 Left eval  Hip flexion      Hip extension 4/5    Hip abduction 4/5    Hip adduction      Hip internal rotation      Hip external rotation      Knee flexion 4/5    Knee extension 4/5    Ankle dorsiflexion NA    Ankle plantarflexion NA    Ankle inversion NA    Ankle eversion NA    Evaluation testing functionally (Blank rows = not tested)   TRANSFERS: 06/30/2022  Sit to stand: SBA from chairs with armrests using back of LLE against chair to stabilize 06/30/2022   Stand to sit:  SBA using UEs to control descent   GAIT: 07/20/2022 Pt ambulates with step-through pattern with RW and Rt medial whip  06/30/2022 Gait pattern: step to pattern, decreased step length- Left, decreased stance time- Right, decreased stride length, decreased hip/knee flexion- Right, Right hip hike, antalgic, and abducted- Right Distance walked: 100' Assistive device utilized: Environmental consultant - 2 wheeled and TTA prosthesis Level of assistance: SBA  needs cues for gait deviations / no assist physically with RW support Comments: excessive UE weight bearing on RW to limit weight on prosthesis.    FUNCTIONAL TESTs:   Merrilee Jansky Balance Scale: 06/30/2022:   20/56   [facility] PT Assessment - 06/30/22 1345                Standardized Balance Assessment    Standardized Balance Assessment Berg Balance Test          Berg Balance Test    Sit to Stand Needs minimal aid to stand or to stabilize     Standing Unsupported Able to stand 2 minutes with supervision     Sitting with Back Unsupported but Feet Supported on Floor or Stool Able to sit safely and securely 2 minutes     Stand to Sit Controls descent by using hands     Transfers Able to transfer safely, definite need of hands     Standing Unsupported with Eyes Closed Unable to keep eyes closed 3 seconds but stays steady     Standing Unsupported with Feet Together Needs help to attain position but able to stand for  30 seconds with feet together     From Standing, Reach Forward with Outstretched Arm Reaches forward but needs supervision     From Standing Position, Pick up Object from  Floor Unable to try/needs assist to keep balance     From Standing Position, Turn to Look Behind Over each Shoulder Needs supervision when turning     Turn 360 Degrees Needs assistance while turning     Standing Unsupported, Alternately Place Feet on Step/Stool Needs assistance to keep from falling or unable to try     Standing Unsupported, One Foot in [place] help to step but can hold 15 seconds     Standing on One Leg Tries to lift leg/unable to hold 3 seconds but remains standing independently     Total Score 20     Berg comment: BERG  < 36 high risk for falls (close to 100%) 46-51 moderate (>50%)   37-45 significant (>80%) 52-55 lower (> 25%)                   CURRENT PROSTHETIC WEAR ASSESSMENT: 06/30/2022 Patient is dependent with: skin check, residual limb care, prosthetic cleaning, ply sock cleaning, correct ply sock adjustment, proper wear schedule/adjustment, and proper weight-bearing schedule/adjustment Donning prosthesis: SBA Doffing prosthesis: SBA Prosthetic wear tolerance: 2 hours, 2x/day, 5 days since delivery Prosthetic weight bearing tolerance: 5 minutes with residual limb pain 3-4/10 Edema: pitting Residual limb condition:  scab medially 13m and dry scab lateral scar 357m  Good hair growth, normal color & temperature.  Cylindrical shape Prosthetic description: silicon liner with pin lock suspension, total contact socket, dynamic response foot K code/activity level with prosthetic use: Level 3     TODAY'S TREATMENT:  08/05/2022 Prosthetic Training with TTA prosthesis Wound 74m46m 2mm73mth Tegaderm covering. No signs of infection.  Increase wear to 4hrs on, 2hrs off, dry limb/liner half way thru 4 hrs.  Pt verbalized understanding. PT demo & verbal cues on technique with prosthesis. Step up &  step down leading with both LEs 6" step with BUE support 10reps.  Stairs with 2 rails:  descend 8 steps with step to pattern using prosthesis / RLE.  3 steps alternating pattern 2 reps with verbal cues.   Ascend alternating pattern with verbal cues.   Driving options with Below Knee Prosthesis - pt verbalized understanding.   Option 1:  Remove prosthesis and use left leg to crossover drive Option 2:  2 Foot driving - right foot on gas & left foot on brake.  Prosthetic motion is leg press motion not ankle pump.  Focus is on heel pressing out not on toes/forefoot.   Option 3: Use prosthesis on both gas & brake.  Continue to focus on heel motion & position.  Option 4: Left foot accelerator.  Consider portable left foot accelerator ( http://plfa.org ) as allows to switch between cars or remove for other drivers.  Left foot both gas & brake.  Using cruise control to accelerate & decelerate or resume speed after stopping can safe some leg work / motion.   Practice initially in large empty parking lot. Stay in middle not near curbs. Practice turning & backing into parking spot on right & left. Parallel park.  Back up.  Quick stops. More about learning foot work not staying in motion.    Neuromuscular Re-ed: Standing hip width on foam eyes open & on floor eyes closed head motions right/left, up/down & diagonals with intermittent touch //bars.    08/03/2022 Prosthetic Training with TTA prosthesis Wound 74mm 54mmm w17m Tegaderm covering. No signs of infection.  PT recommended increasing wear to 3 hours on, 2 hours off, during awake hours.  Continue Tegaderm until wound heals.   Pt amb 300' with cane stand alone tip with supervision working on maintaining path & pace with scanning right/left, up/down & diagonals. Pt neg ramp & curb with cane with PT demo & verbal cues with supervision. Step up & step down in //bars BUE support leading with BLEs 10 reps ea with 6" step. PT demo & verbal cues on technique.   Stairs with 2 rails: PT demo & verbal cues on technique - descend step-to pattern but alt lead LE.  Ascend alternating pattern.    07/29/2022 Prosthetic Training with TTA prosthesis  7m X 598mwhich appears slightly smaller & less redness / irritation with use of Tegaderm.  He continues with U-shaped pad around problematic area.  Pt donned prosthesis correctly.  Prosthetist appt: ask if 3X shrinker is correct size,  pre-tibial pads? &/or grind distal tibia area of socket and if needs new socket ask if pin vac system would work.  Stop use of U padding but cont Tegaderm until wound heals.  Pt verbalized understanding. Continue floor transfers using BUEs to push up. Pt verbalized understanding. Use RW to limit weight until pain / wound issue resolved.  Neuromuscular Re-ed: Simulated casting fishing rod and reeling in fish with //bars to catch balance intermittently.  PT demo & verbal cues on set-up for home practice with chair behind & RW in front. Pt verbalized understanding.  Sit to/from stand from 24" bar stool without UE assist including stabilization 10 reps.  Stop squats that he reported doing until pain issue resolved.  Standing hip width on foam eyes open & on floor eyes closed head motions right/left, up/down & diagonals with intermittent touch //bars. PT demo & verbal cues how to perform in corner with RW. Pt verbalized understanding.   HOME EXERCISE PROGRAM:  Access Code: [email or web address]: [email or web address] Date: 07/20/2022 Prepared by: RoJamey ReasExercises - Seated Quad Set  - 3-5 x daily - 7 x weekly - 2-3 sets - 10-20 reps - 5 seconds hold - Straight Leg Raise with External Rotation  - 3-5 x daily - 7 x weekly - 2-3 sets - 10-20 reps - 5 seconds hold   Do each exercise 1-2  times per day Do each exercise 5-10 repetitions Hold each exercise for 2 seconds to feel your location  AT [place] Try to find this position when standing still for activities.   USE TAPE ON FLOOR TO MARK THE MIDLINE POSITION which is even with middle of sink.  You also should try to feel with your limb pressure in socket.  You are trying to feel with limb what you used to feel with the bottom of your foot.  Side to Side Shift: Moving your hips only (not shoulders): move weight onto your left leg, HOLD/FEEL pressure in socket.  Move back to equal weight on each leg, HOLD/FEEL pressure in socket. Move weight onto your right leg, HOLD/FEEL pressure in socket. Move back to equal weight on each leg, HOLD/FEEL pressure in socket. Repeat.  Start with both hands on sink, progress to hand on prosthetic side only, then no hands.  Front to Back Shift: Moving your hips only (not shoulders): move your weight forward onto your toes, HOLD/FEEL pressure in socket. Move your weight back to equal Flat Foot on both legs, HOLD/FEEL  pressure in socket. Move your weight back onto your heels, HOLD/FEEL  pressure in socket.  Move your weight back to equal on both legs, HOLD/FEEL  pressure in socket. Repeat.  Start with both hands on sink, progress to hand on prosthetic side only, then no hands.  Moving Cones / Cups: With equal weight on each leg: Hold on with one hand the first time, then progress to no hand supports. Move cups from one side of sink to the other. Place cups ~2" out of your reach, progress to 10" beyond reach.  Place one hand in middle of sink and reach with other hand. Do both arms.  Then hover one hand and move cups with other hand.  Overhead/Upward Reaching: alternated reaching up to top cabinets or ceiling if no cabinets present. Keep equal weight on each leg. Start with one hand support on counter while other hand reaches and progress to no hand support with reaching.  ace one hand in middle of sink and reach with other hand. Do both arms.  Then hover one hand and move cups with other hand.  5.   Looking Over  Shoulders: With equal weight on each leg: alternate turning to look over your shoulders with one hand support on counter as needed.  Start with head motions only to look in front of shoulder, then even with shoulder and progress to looking behind you. To look to side, move head /eyes, then shoulder on side looking pulls back, shift more weight to side looking and pull hip back. Place one hand in middle of sink and let go with other hand so your shoulder can pull back. Switch hands to look other way.   Then hover one hand and look over shoulder. If looking right, use left hand at sink. If looking left, use right hand at sink. 6.  Stepping with leg that is not amputated:  Move items under cabinet out of your way. Shift your hips/pelvis so weight on prosthesis. Tighten muscles in hip on prosthetic side.  SLOWLY step other leg so front of foot is in cabinet. Then step back to floor.       ASSESSMENT: CLINICAL IMPRESSION: Patient has no pain in residual limb now so PT progressed to activities that require more pressure on socket.  He improved step up & step down with prosthesis with instruction & practice.  He seems to have general understanding of return to driving with right BKA.  He continues to benefit from skilled PT.      OBJECTIVE IMPAIRMENTS Abnormal gait, cardiopulmonary status limiting activity, decreased activity tolerance, decreased balance, decreased endurance, decreased knowledge of condition, decreased knowledge of use of DME, decreased mobility, difficulty walking, decreased strength, increased edema, postural dysfunction, prosthetic dependency , obesity, pain, and skin integrity issues on residual limb .    ACTIVITY LIMITATIONS carrying, lifting, bending, sitting, standing, squatting, stairs, transfers, locomotion level, and safe prosthesis use   PARTICIPATION LIMITATIONS: driving, community activity, and recreational activities   [place], Time since onset of  injury/illness/exacerbation, and 3+ comorbidities: see PMH  are also affecting patient's functional outcome.    REHAB POTENTIAL: Good   CLINICAL DECISION MAKING: Evolving/moderate complexity   EVALUATION COMPLEXITY: Moderate     GOALS: Goals reviewed with patient? Yes   SHORT TERM GOALS: Target date: 08/27/2022   Patient verbalizes adjusting ply socks & sweat management.  Baseline: SEE OBJECTIVE DATA Goal status: ongoing 08/03/2022 2.  Patient tolerates prosthesis >12 hrs total /day with no skin issues or limb pain after standing. Baseline: SEE OBJECTIVE DATA Goal status: ongoing 08/03/2022  3.  Berg Balance Test >/= 36/56 Baseline: SEE OBJECTIVE DATA Goal status: ongoing 08/03/2022   4. Patient ambulates 200' with cane & prosthesis with supervision. Baseline: SEE OBJECTIVE DATA Goal status: ongoing 08/03/2022   5. Patient negotiates ramps & curbs with cane & prosthesis with minA. Baseline: SEE OBJECTIVE DATA Goal status:  ongoing 08/03/2022     LONG TERM GOALS: Target date: 09/24/2022   Patient demonstrates & verbalized understanding of prosthetic care to enable safe utilization of prosthesis. Baseline: SEE OBJECTIVE DATA Goal status: INITIAL   Patient tolerates prosthesis wear >90% of awake hours without skin or limb pain issues. Baseline: SEE OBJECTIVE DATA Goal status: INITIAL   Berg Balance >/= 40/56  to indicate lower fall risk Baseline: SEE OBJECTIVE DATA Goal status: INITIAL   Patient ambulates >500' with prosthesis & cane or less independently Baseline: SEE OBJECTIVE DATA Goal status: INITIAL   Patient negotiates ramps, curbs & stairs with single rail with prosthesis cane or less independently. Baseline: SEE OBJECTIVE DATA Goal status: INITIAL   Patient demonstrates & verbalizes climbing ladder, lifting, carrying, pushing, pulling with prosthesis safely. Baseline: SEE OBJECTIVE DATA Goal status: INITIAL   Patient verbalizes & demonstrates understanding  of fishing simulations.  Baseline: SEE OBJECTIVE DATA Goal status: INITIAL   PLAN: PT FREQUENCY: 2x/week   PT DURATION: 90 days / 13 weeks   PLANNED INTERVENTIONS: Therapeutic exercises, Therapeutic activity, Neuromuscular re-education, Balance training, Gait training, Patient/Family education, Stair training, Vestibular training, Prosthetic training, DME instructions, and Physical Performance Testing   PLAN FOR NEXT SESSION:   do 10th visit note, progress prosthetic gait with cane including ramp & curb for community function & no device for household.  Standing balance including compliant surfaces. Therapeutic exercise with functional focus.  Jamey Reas, PT, DPT 08/05/2022, 1:21 PM

## 2022-08-05 NOTE — Patient Instructions (Signed)
Driving options with Below Knee Prosthesis   Option 1:  Remove prosthesis and use left leg to crossover drive Option 2:  2 Foot driving - right foot on gas & left foot on brake.  Prosthetic motion is leg press motion not ankle pump.  Focus is on heel pressing out not on toes/forefoot.   Option 3: Use prosthesis on both gas & brake.  Continue to focus on heel motion & position.  Option 4: Left foot accelerator.  Consider portable left foot accelerator ( http://plfa.org ) as allows to switch between cars or remove for other drivers.  Left foot both gas & brake.  Using cruise control to accelerate & decelerate or resume speed after stopping can safe some leg work / motion.   Practice initially in large empty parking lot. Stay in middle not near curbs. Practice turning & backing into parking spot on right & left. Parallel park.  Back up.  Quick stops. More about learning foot work not staying in motion.

## 2022-08-06 ENCOUNTER — Encounter: Payer: Self-pay | Admitting: *Deleted

## 2022-08-07 ENCOUNTER — Other Ambulatory Visit: Payer: Self-pay | Admitting: Cardiology

## 2022-08-07 ENCOUNTER — Encounter: Payer: Self-pay | Admitting: Family Medicine

## 2022-08-10 ENCOUNTER — Ambulatory Visit (INDEPENDENT_AMBULATORY_CARE_PROVIDER_SITE_OTHER): Payer: Medicare Other | Admitting: Physical Therapy

## 2022-08-10 ENCOUNTER — Encounter: Payer: Self-pay | Admitting: Physical Therapy

## 2022-08-10 DIAGNOSIS — R293 Abnormal posture: Secondary | ICD-10-CM

## 2022-08-10 DIAGNOSIS — M6281 Muscle weakness (generalized): Secondary | ICD-10-CM

## 2022-08-10 DIAGNOSIS — M79661 Pain in right lower leg: Secondary | ICD-10-CM | POA: Diagnosis not present

## 2022-08-10 DIAGNOSIS — R2689 Other abnormalities of gait and mobility: Secondary | ICD-10-CM | POA: Diagnosis not present

## 2022-08-10 DIAGNOSIS — R2681 Unsteadiness on feet: Secondary | ICD-10-CM

## 2022-08-10 NOTE — Therapy (Signed)
OUTPATIENT PHYSICAL THERAPY TREATMENT NOTE & 10TH VISIT PROGRESS NOTE   Patient Name: Eduardo [name] MRN: [id number] DOB:[birth year], 66 y.o., male Today's Date: 08/10/2022  PCP: Rita Ohara, MD REFERRING PROVIDER: Newt Minion, MD  Progress Note Reporting Period 06/30/2022 to 08/10/2022  See note below for Objective Data and Assessment of Progress/Goals.     END OF SESSION:   PT End of Session - 08/10/22 1019     Visit Number 10    Number of Visits 26    Date for PT Re-Evaluation 09/24/22    Authorization Type [employer] Medicare    Authorization Time Period $20 copay    Progress Note Due on Visit 10    PT Start Time 1018    PT Stop Time 1103    PT Time Calculation (min) 45 min    Equipment Utilized During Treatment Gait belt    Activity Tolerance Patient tolerated treatment well;Patient limited by pain    Behavior During Therapy WFL for tasks assessed/performed                 Past Medical History:  Diagnosis Date   CHF (congestive heart failure) ([place])    Chronic kidney disease    stage 3   Colon polyp    Coronary artery disease    Diabetes mellitus 1987   under care of Dr. Chalmers Cater.  On insulin since 96 (off and on)   Diabetic retinopathy    Dupuytren contracture    R hand, s/p injection (Dr. Lenon Curt)   Essential hypertension, benign    Essential hypertension, benign 02/06/2019   Frequency of urination and polyuria    Hypertension    Myocardial infarction [facility])    denies   Neuromuscular disorder ([place])    Diabetic neuropathy   Osteomyelitis ([place])    right foot   Other testicular hypofunction    Peripheral arterial disease ([place]) 10/28/2012   Peritoneal abscess ([place]) 6/08   and buttock.   Pneumonia    Polydipsia    Proteinuria    Pure hyperglyceridemia    Subacute osteomyelitis, right ankle and foot ([place])    Wears glasses    Past Surgical History:  Procedure Laterality Date   ABDOMINAL AORTAGRAM N/A 04/18/2012   Procedure: ABDOMINAL Maxcine Ham;  Surgeon:  Angelia Mould, MD;  Location: [facility] CATH LAB;  Service: Cardiovascular;  Laterality: N/A;   AMPUTATION Right 05/19/2019   Procedure: RIGHT FOOT 5TH RAY AMPUTATION;  Surgeon: Newt Minion, MD;  Location: [place];  Service: Orthopedics;  Laterality: Right;   AMPUTATION Right 03/11/2022   Procedure: RIGHT LEG DEBRIDEMENT VS. BELOW KNEE AMPUTATION;  Surgeon: Newt Minion, MD;  Location: [place];  Service: Orthopedics;  Laterality: Right;   CARDIAC CATHETERIZATION N/A 09/08/2016   Procedure: Left Heart Cath and Coronary Angiography;  Surgeon: Adrian Prows, MD;  Location: [place] CV LAB;  Service: Cardiovascular;  Laterality: N/A;   CATARACT EXTRACTION, BILATERAL  09/2017, 10/2017   Dr. Herbert Deaner   COLONOSCOPY W/ BIOPSIES AND POLYPECTOMY     CORONARY/GRAFT ACUTE MI REVASCULARIZATION N/A 10/11/2020   Procedure: Coronary/Graft Acute MI Revascularization;  Surgeon: Adrian Prows, MD;  Location: [place] CV LAB;  Service: Cardiovascular;  Laterality: N/A;   I & D EXTREMITY Right 03/11/2022   Procedure: BELOW KNEE AMPUTATION;  Surgeon: Newt Minion, MD;  Location: [place];  Service: Orthopedics;  Laterality: Right;   LEFT HEART CATH N/A 12/31/2020   Procedure: Left Heart Cath;  Surgeon: Adrian Prows, MD;  Location: [place] CV LAB;  Service: Cardiovascular;  Laterality: N/A;   LEFT HEART CATH AND CORONARY ANGIOGRAPHY N/A 10/11/2020   Procedure: LEFT HEART CATH AND CORONARY ANGIOGRAPHY;  Surgeon: Adrian Prows, MD;  Location: [place] CV LAB;  Service: Cardiovascular;  Laterality: N/A;   LOWER EXTREMITY ANGIOGRAM Bilateral 04/18/2012   Procedure: LOWER EXTREMITY ANGIOGRAM;  Surgeon: Angelia Mould, MD;  Location: [facility] CATH LAB;  Service: Cardiovascular;  Laterality: Bilateral;  bilat lower extrem angio   LOWER EXTREMITY ANGIOGRAPHY Bilateral 05/02/2019   Procedure: LOWER EXTREMITY ANGIOGRAPHY;  Surgeon: Adrian Prows, MD;  Location: [place] CV LAB;  Service: Cardiovascular;  Laterality: Bilateral;   LOWER  EXTREMITY ANGIOGRAPHY Bilateral 02/28/2019   Procedure: LOWER EXTREMITY ANGIOGRAPHY;  Surgeon: Adrian Prows, MD;  Location: [place] CV LAB;  Service: Cardiovascular;  Laterality: Bilateral;   macular photocoagulation     (eye treatments for diabetic retinopathy)-Dr. Zigmund Daniel   PERIPHERAL VASCULAR INTERVENTION  02/28/2019   Procedure: PERIPHERAL VASCULAR INTERVENTION;  Surgeon: Adrian Prows, MD;  Location: [place] CV LAB;  Service: Cardiovascular;;   TEE WITHOUT CARDIOVERSION N/A 03/18/2022   Procedure: TRANSESOPHAGEAL ECHOCARDIOGRAM (TEE);  Surgeon: Adrian Prows, MD;  Location: [facility] ENDOSCOPY;  Service: Cardiovascular;  Laterality: N/A;   VENTRICULAR ASSIST DEVICE INSERTION N/A 12/31/2020   Procedure: VENTRICULAR ASSIST DEVICE INSERTION;  Surgeon: Adrian Prows, MD;  Location: [place] CV LAB;  Service: Cardiovascular;  Laterality: N/A;   Patient Active Problem List   Diagnosis Date Noted   Chronic right-sided congestive heart failure ([place]) 05/18/2022   Ejection fraction < 50% 04/17/2022   Insomnia    Constipation    S/P BKA (below knee amputation), right ([place]) 03/21/2022   Ischemic cardiomyopathy    AKI (acute kidney injury) ([place]) 03/13/2022   Streptococcal bacteremia 03/13/2022   Hypophosphatemia 03/13/2022   Septic shock ([place]) 11/17/3233   Acute systolic heart failure ([place]) 12/28/2020   Acute pulmonary edema ([place]) 10/11/2020   Non-ST elevation (NSTEMI) myocardial infarction ([place]) 10/11/2020   Acute respiratory distress 10/11/2020   Acute on chronic combined systolic and diastolic CHF (congestive heart failure) ([place]) 10/11/2020   Cardiogenic shock (HCC)    Partial nontraumatic amputation of foot, right ([place]) 06/21/2019   Osteomyelitis of right foot ([place])    Essential hypertension, benign 02/06/2019   Hypercholesteremia 02/06/2019   Stage 3b chronic kidney disease (CKD) ([place]) 10/06/2017   Proteinuria 10/06/2017   Controlled type 2 diabetes mellitus with hyperglycemia, with long-term  current use of insulin ([place]) 10/06/2017   Coronary artery disease involving native coronary artery of native heart without angina pectoris 05/23/2017   Abnormal nuclear stress test 09/06/2016   PAD (peripheral artery disease) ([place]) 10/28/2012   Morbid obesity with BMI of 40.0-44.9, adult ([place]) 10/13/2012   Diabetes mellitus with ophthalmic complication ([place]) 57/32/2025   Hypertension associated with diabetes ([place]) 10/13/2012   Atherosclerosis of native artery of extremity with intermittent claudication ([place]) 04/05/2012   Adenomatous colon polyp 09/28/2011   Diabetes mellitus ([place]) 09/28/2011   Pure hyperglyceridemia 09/28/2011   Erectile dysfunction 09/28/2011   Diabetic retinopathy ([place]) 09/28/2011    REFERRING DIAG: Z89.511 (ICD-10-CM) - S/P below knee amputation, right  ONSET DATE: 06/25/2022 prosthesis delivery  THERAPY DIAG:  Other abnormalities of gait and mobility  Unsteadiness on feet  Muscle weakness (generalized)  Abnormal posture  Pain in right lower leg  Rationale for Evaluation and Treatment Rehabilitation  PERTINENT HISTORY: combined systolic & diastolic CHF/cardiomyopathy EF 15-20%, DM2, PVD/PAD, CAD, CABG 2017, HTN, HLD, morbid obesity,  CKD st3b, diabetic retinopathy  PRECAUTIONS: None  SUBJECTIVE:   He is wearing prosthesis 4 hrs 3x/day.  He stopped Tegaderm yesterday. He is using cane in home until his limb is tired & sore close to 3.5 hr of 4 hour wear esp late in day.   PAIN:  Are you having pain?   0/10 today  OBJECTIVE: (objective measures completed at initial evaluation unless otherwise dated)  OBJECTIVE:    COGNITION: 06/30/2022  Overall cognitive status: Within functional limits for tasks assessed   POSTURE: 06/30/2022  rounded shoulders, forward head, and weight shift left   LOWER EXTREMITY ROM:   ROM P:passive  A:active Right Eval 06/30/2022 Left eval  Hip flexion      Hip extension      Hip abduction      Hip adduction      Hip  internal rotation      Hip external rotation      Knee flexion      Knee extension 0*    Ankle dorsiflexion      Ankle plantarflexion      Ankle inversion      Ankle eversion       (Blank rows = not tested)   LOWER EXTREMITY MMT:   MMT Right Eval 06/30/2022 Left eval  Hip flexion      Hip extension 4/5    Hip abduction 4/5    Hip adduction      Hip internal rotation      Hip external rotation      Knee flexion 4/5    Knee extension 4/5    Ankle dorsiflexion NA    Ankle plantarflexion NA    Ankle inversion NA    Ankle eversion NA    Evaluation testing functionally (Blank rows = not tested)   TRANSFERS: 06/30/2022  Sit to stand: SBA from chairs with armrests using back of LLE against chair to stabilize 06/30/2022   Stand to sit:  SBA using UEs to control descent   GAIT: 07/20/2022 Pt ambulates with step-through pattern with RW and Rt medial whip  06/30/2022 Gait pattern: step to pattern, decreased step length- Left, decreased stance time- Right, decreased stride length, decreased hip/knee flexion- Right, Right hip hike, antalgic, and abducted- Right Distance walked: 100' Assistive device utilized: Environmental consultant - 2 wheeled and TTA prosthesis Level of assistance: SBA  needs cues for gait deviations / no assist physically with RW support Comments: excessive UE weight bearing on RW to limit weight on prosthesis.    FUNCTIONAL TESTs:   Merrilee Jansky Balance Scale: 06/30/2022:   20/56   [facility] PT Assessment - 06/30/22 1345                Standardized Balance Assessment    Standardized Balance Assessment Berg Balance Test          Berg Balance Test    Sit to Stand Needs minimal aid to stand or to stabilize     Standing Unsupported Able to stand 2 minutes with supervision     Sitting with Back Unsupported but Feet Supported on Floor or Stool Able to sit safely and securely 2 minutes     Stand to Sit Controls descent by using hands     Transfers Able to transfer safely, definite need of  hands     Standing Unsupported with Eyes Closed Unable to keep eyes closed 3 seconds but stays steady     Standing Unsupported with Feet Together Needs help to attain position  but able to stand for 30 seconds with feet together     From Standing, Reach Forward with Outstretched Arm Reaches forward but needs supervision     From Standing Position, Pick up Object from Floor Unable to try/needs assist to keep balance     From Standing Position, Turn to Look Behind Over each Shoulder Needs supervision when turning     Turn 360 Degrees Needs assistance while turning     Standing Unsupported, Alternately Place Feet on Step/Stool Needs assistance to keep from falling or unable to try     Standing Unsupported, One Foot in [place] help to step but can hold 15 seconds     Standing on One Leg Tries to lift leg/unable to hold 3 seconds but remains standing independently     Total Score 20     Berg comment: BERG  < 36 high risk for falls (close to 100%) 46-51 moderate (>50%)   37-45 significant (>80%) 52-55 lower (> 25%)                   CURRENT PROSTHETIC WEAR ASSESSMENT: 06/30/2022 Patient is dependent with: skin check, residual limb care, prosthetic cleaning, ply sock cleaning, correct ply sock adjustment, proper wear schedule/adjustment, and proper weight-bearing schedule/adjustment Donning prosthesis: SBA Doffing prosthesis: SBA Prosthetic wear tolerance: 2 hours, 2x/day, 5 days since delivery Prosthetic weight bearing tolerance: 5 minutes with residual limb pain 3-4/10 Edema: pitting Residual limb condition:  scab medially 79m and dry scab lateral scar 349m  Good hair growth, normal color & temperature.  Cylindrical shape Prosthetic description: silicon liner with pin lock suspension, total contact socket, dynamic response foot K code/activity level with prosthetic use: Level 3     TODAY'S TREATMENT:  08/10/2022 Prosthetic Training with TTA prosthesis Wound 66m67m 66mm43mth no Tegaderm  covering. No signs of infection.  Increase wear to 6hrs on, 2hrs off mid-day, dry limb/liner half way thru 6 hrs.  Pt verbalized understanding. Pt amb 200' X 2 with cane with minA to min guard working on scanning maintaining path/pace. PT demo & verbal cues on stepping over obstacle. Pt return demo with cane 4 reps with min guard.  PT demo & verbal cues on walking backwards & tandem.  Pt return demo with cane LUE & counter RUE. Pt neg ramp & curb with cane with minA & verbal cues on technique.   Therapeutic Activity: Sit to/from stand 18" chair without armrests 10 reps cues for midline to engage BLEs Step up & step down on BOSU round side up with BUE support, 2 step lead for momentum with BLEs 10 reps Lateral step up on BOSU round side up with BUE support with contralateral LE adducting across midline ant & post with knee control, 5 reps ea LE.  PT demo & verbal cues on technique PT demo & verbal cues on lifting box. Pt return demo lifting box with supervision / chair behind for safety and lowering box to floor toke 3 reps until no LOB.     08/05/2022 Prosthetic Training with TTA prosthesis Wound 66mm 30mmm w58m Tegaderm covering. No signs of infection.  Increase wear to 4hrs on, 2hrs off, dry limb/liner half way thru 4 hrs.  Pt verbalized understanding. PT demo & verbal cues on technique with prosthesis. Step up & step down leading with both LEs 6" step with BUE support 10reps.  Stairs with 2 rails:  descend 8 steps with step to pattern using prosthesis / RLE.  3  steps alternating pattern 2 reps with verbal cues.   Ascend alternating pattern with verbal cues.   Driving options with Below Knee Prosthesis - pt verbalized understanding.   Option 1:  Remove prosthesis and use left leg to crossover drive Option 2:  2 Foot driving - right foot on gas & left foot on brake.  Prosthetic motion is leg press motion not ankle pump.  Focus is on heel pressing out not on toes/forefoot.   Option 3: Use  prosthesis on both gas & brake.  Continue to focus on heel motion & position.  Option 4: Left foot accelerator.  Consider portable left foot accelerator ( http://plfa.org ) as allows to switch between cars or remove for other drivers.  Left foot both gas & brake.  Using cruise control to accelerate & decelerate or resume speed after stopping can safe some leg work / motion.   Practice initially in large empty parking lot. Stay in middle not near curbs. Practice turning & backing into parking spot on right & left. Parallel park.  Back up.  Quick stops. More about learning foot work not staying in motion.    Neuromuscular Re-ed: Standing hip width on foam eyes open & on floor eyes closed head motions right/left, up/down & diagonals with intermittent touch //bars.    08/03/2022 Prosthetic Training with TTA prosthesis Wound 10m X 27mwith Tegaderm covering. No signs of infection.  PT recommended increasing wear to 3 hours on, 2 hours off, during awake hours.  Continue Tegaderm until wound heals.   Pt amb 300' with cane stand alone tip with supervision working on maintaining path & pace with scanning right/left, up/down & diagonals. Pt neg ramp & curb with cane with PT demo & verbal cues with supervision. Step up & step down in //bars BUE support leading with BLEs 10 reps ea with 6" step. PT demo & verbal cues on technique.  Stairs with 2 rails: PT demo & verbal cues on technique - descend step-to pattern but alt lead LE.  Ascend alternating pattern.    HOME EXERCISE PROGRAM:  Access Code: [email or web address]: [email or web address] Date: 07/20/2022 Prepared by: RoJamey ReasExercises - Seated Quad Set  - 3-5 x daily - 7 x weekly - 2-3 sets - 10-20 reps - 5 seconds hold - Straight Leg Raise with External Rotation  - 3-5 x daily - 7 x weekly - 2-3 sets - 10-20 reps - 5 seconds hold   Do each exercise 1-2  times per day Do each exercise 5-10 repetitions Hold each exercise for 2  seconds to feel your location  [place] Try to find this position when standing still for activities.   USE TAPE ON FLOOR TO MARK THE MIDLINE POSITION which is even with middle of sink.  You also should try to feel with your limb pressure in socket.  You are trying to feel with limb what you used to feel with the bottom of your foot.  Side to Side Shift: Moving your hips only (not shoulders): move weight onto your left leg, HOLD/FEEL pressure in socket.  Move back to equal weight on each leg, HOLD/FEEL pressure in socket. Move weight onto your right leg, HOLD/FEEL pressure in socket. Move back to equal weight on each leg, HOLD/FEEL pressure in socket. Repeat.  Start with both hands on sink, progress to hand on prosthetic side only, then no hands.  Front to Back  Shift: Moving your hips only (not shoulders): move your weight forward onto your toes, HOLD/FEEL pressure in socket. Move your weight back to equal Flat Foot on both legs, HOLD/FEEL  pressure in socket. Move your weight back onto your heels, HOLD/FEEL  pressure in socket. Move your weight back to equal on both legs, HOLD/FEEL  pressure in socket. Repeat.  Start with both hands on sink, progress to hand on prosthetic side only, then no hands.  Moving Cones / Cups: With equal weight on each leg: Hold on with one hand the first time, then progress to no hand supports. Move cups from one side of sink to the other. Place cups ~2" out of your reach, progress to 10" beyond reach.  Place one hand in middle of sink and reach with other hand. Do both arms.  Then hover one hand and move cups with other hand.  Overhead/Upward Reaching: alternated reaching up to top cabinets or ceiling if no cabinets present. Keep equal weight on each leg. Start with one hand support on counter while other hand reaches and progress to no hand support with reaching.  ace one hand in middle of sink and  reach with other hand. Do both arms.  Then hover one hand and move cups with other hand.  5.   Looking Over Shoulders: With equal weight on each leg: alternate turning to look over your shoulders with one hand support on counter as needed.  Start with head motions only to look in front of shoulder, then even with shoulder and progress to looking behind you. To look to side, move head /eyes, then shoulder on side looking pulls back, shift more weight to side looking and pull hip back. Place one hand in middle of sink and let go with other hand so your shoulder can pull back. Switch hands to look other way.   Then hover one hand and look over shoulder. If looking right, use left hand at sink. If looking left, use right hand at sink. 6.  Stepping with leg that is not amputated:  Move items under cabinet out of your way. Shift your hips/pelvis so weight on prosthesis. Tighten muscles in hip on prosthetic side.  SLOWLY step other leg so front of foot is in cabinet. Then step back to floor.       ASSESSMENT: CLINICAL IMPRESSION: Pt. has attended 10 visits since last progress note.  See objective data for updated information.  Pt. has made gains towards improving his mobility with prosthesis for community accessibility without w/c.  Pt. may continue to benefit from skilled PT services to continue progression towards reaching established goals and reduced risk of falls due to presentation.    OBJECTIVE IMPAIRMENTS Abnormal gait, cardiopulmonary status limiting activity, decreased activity tolerance, decreased balance, decreased endurance, decreased knowledge of condition, decreased knowledge of use of DME, decreased mobility, difficulty walking, decreased strength, increased edema, postural dysfunction, prosthetic dependency , obesity, pain, and skin integrity issues on residual limb .    ACTIVITY LIMITATIONS carrying, lifting, bending, sitting, standing, squatting, stairs, transfers, locomotion level, and safe  prosthesis use   PARTICIPATION LIMITATIONS: driving, community activity, and recreational activities   [place], Time since onset of injury/illness/exacerbation, and 3+ comorbidities: see PMH  are also affecting patient's functional outcome.    REHAB POTENTIAL: Good   CLINICAL DECISION MAKING: Evolving/moderate complexity   EVALUATION COMPLEXITY: Moderate     GOALS: Goals reviewed with patient? Yes   SHORT TERM GOALS: Target date: 08/27/2022  Patient verbalizes adjusting ply socks & sweat management.  Baseline: SEE OBJECTIVE DATA Goal status: ongoing 08/03/2022 2.  Patient tolerates prosthesis >12 hrs total /day with no skin issues or limb pain after standing. Baseline: SEE OBJECTIVE DATA Goal status: ongoing 08/03/2022   3.  Berg Balance Test >/= 36/56 Baseline: SEE OBJECTIVE DATA Goal status: ongoing 08/03/2022   4. Patient ambulates 200' with cane & prosthesis with supervision. Baseline: SEE OBJECTIVE DATA Goal status: ongoing 08/03/2022   5. Patient negotiates ramps & curbs with cane & prosthesis with minA. Baseline: SEE OBJECTIVE DATA Goal status:  ongoing 08/03/2022     LONG TERM GOALS: Target date: 09/24/2022   Patient demonstrates & verbalized understanding of prosthetic care to enable safe utilization of prosthesis. Baseline: SEE OBJECTIVE DATA Goal status: INITIAL   Patient tolerates prosthesis wear >90% of awake hours without skin or limb pain issues. Baseline: SEE OBJECTIVE DATA Goal status: INITIAL   Berg Balance >/= 40/56  to indicate lower fall risk Baseline: SEE OBJECTIVE DATA Goal status: INITIAL   Patient ambulates >500' with prosthesis & cane or less independently Baseline: SEE OBJECTIVE DATA Goal status: INITIAL   Patient negotiates ramps, curbs & stairs with single rail with prosthesis cane or less independently. Baseline: SEE OBJECTIVE DATA Goal status: INITIAL   Patient demonstrates & verbalizes climbing ladder, lifting,  carrying, pushing, pulling with prosthesis safely. Baseline: SEE OBJECTIVE DATA Goal status: INITIAL   Patient verbalizes & demonstrates understanding of fishing simulations.  Baseline: SEE OBJECTIVE DATA Goal status: INITIAL   PLAN: PT FREQUENCY: 2x/week   PT DURATION: 90 days / 13 weeks   PLANNED INTERVENTIONS: Therapeutic exercises, Therapeutic activity, Neuromuscular re-education, Balance training, Gait training, Patient/Family education, Stair training, Vestibular training, Prosthetic training, DME instructions, and Physical Performance Testing   PLAN FOR NEXT SESSION:   continue to progress prosthetic gait with cane including ramp & curb for community function & no device for household.  Standing balance including compliant surfaces. Therapeutic exercise with functional focus.  Jamey Reas, PT, DPT 08/10/2022, 4:39 PM

## 2022-08-12 ENCOUNTER — Encounter: Payer: Self-pay | Admitting: Physical Therapy

## 2022-08-12 ENCOUNTER — Ambulatory Visit (INDEPENDENT_AMBULATORY_CARE_PROVIDER_SITE_OTHER): Payer: Medicare Other | Admitting: Physical Therapy

## 2022-08-12 DIAGNOSIS — M79661 Pain in right lower leg: Secondary | ICD-10-CM | POA: Diagnosis not present

## 2022-08-12 DIAGNOSIS — R293 Abnormal posture: Secondary | ICD-10-CM | POA: Diagnosis not present

## 2022-08-12 DIAGNOSIS — M6281 Muscle weakness (generalized): Secondary | ICD-10-CM | POA: Diagnosis not present

## 2022-08-12 DIAGNOSIS — R2689 Other abnormalities of gait and mobility: Secondary | ICD-10-CM | POA: Diagnosis not present

## 2022-08-12 DIAGNOSIS — R2681 Unsteadiness on feet: Secondary | ICD-10-CM | POA: Diagnosis not present

## 2022-08-12 NOTE — Therapy (Signed)
OUTPATIENT PHYSICAL THERAPY TREATMENT NOTE  Patient Name: Eduardo [name] MRN: [id number] DOB:Sep 14, 1956, 66 y.o., male Today's Date: 08/12/2022  PCP: Rita Ohara, MD REFERRING PROVIDER: Newt Minion, MD      END OF SESSION:   PT End of Session - 08/12/22 1152     Visit Number 11    Number of Visits 26    Date for PT Re-Evaluation 09/24/22    Authorization Type [employer] Medicare    Authorization Time Period $20 copay    Progress Note Due on Visit 20    PT Start Time 1145    PT Stop Time 1229    PT Time Calculation (min) 44 min    Equipment Utilized During Treatment Gait belt    Activity Tolerance Patient tolerated treatment well;Patient limited by pain    Behavior During Therapy WFL for tasks assessed/performed                  Past Medical History:  Diagnosis Date   CHF (congestive heart failure) ([place])    Chronic kidney disease    stage 3   Colon polyp    Coronary artery disease    Diabetes mellitus 1987   under care of Dr. Chalmers Cater.  On insulin since 96 (off and on)   Diabetic retinopathy    Dupuytren contracture    R hand, s/p injection (Dr. Lenon Curt)   Essential hypertension, benign    Essential hypertension, benign 02/06/2019   Frequency of urination and polyuria    Hypertension    Myocardial infarction [facility])    denies   Neuromuscular disorder ([place])    Diabetic neuropathy   Osteomyelitis ([place])    right foot   Other testicular hypofunction    Peripheral arterial disease ([place]) 10/28/2012   Peritoneal abscess ([place]) 6/08   and buttock.   Pneumonia    Polydipsia    Proteinuria    Pure hyperglyceridemia    Subacute osteomyelitis, right ankle and foot ([place])    Wears glasses    Past Surgical History:  Procedure Laterality Date   ABDOMINAL AORTAGRAM N/A 04/18/2012   Procedure: ABDOMINAL Maxcine Ham;  Surgeon: Angelia Mould, MD;  Location: [facility] CATH LAB;  Service: Cardiovascular;  Laterality: N/A;   AMPUTATION Right 05/19/2019   Procedure: RIGHT FOOT 5TH RAY  AMPUTATION;  Surgeon: Newt Minion, MD;  Location: [place];  Service: Orthopedics;  Laterality: Right;   AMPUTATION Right 03/11/2022   Procedure: RIGHT LEG DEBRIDEMENT VS. BELOW KNEE AMPUTATION;  Surgeon: Newt Minion, MD;  Location: [place];  Service: Orthopedics;  Laterality: Right;   CARDIAC CATHETERIZATION N/A 09/08/2016   Procedure: Left Heart Cath and Coronary Angiography;  Surgeon: Adrian Prows, MD;  Location: [place] CV LAB;  Service: Cardiovascular;  Laterality: N/A;   CATARACT EXTRACTION, BILATERAL  09/2017, 10/2017   Dr. Herbert Deaner   COLONOSCOPY W/ BIOPSIES AND POLYPECTOMY     CORONARY/GRAFT ACUTE MI REVASCULARIZATION N/A 10/11/2020   Procedure: Coronary/Graft Acute MI Revascularization;  Surgeon: Adrian Prows, MD;  Location: [place] CV LAB;  Service: Cardiovascular;  Laterality: N/A;   I & D EXTREMITY Right 03/11/2022   Procedure: BELOW KNEE AMPUTATION;  Surgeon: Newt Minion, MD;  Location: [place];  Service: Orthopedics;  Laterality: Right;   LEFT HEART CATH N/A 12/31/2020   Procedure: Left Heart Cath;  Surgeon: Adrian Prows, MD;  Location: [place] CV LAB;  Service: Cardiovascular;  Laterality: N/A;   LEFT HEART CATH AND CORONARY ANGIOGRAPHY N/A 10/11/2020  Procedure: LEFT HEART CATH AND CORONARY ANGIOGRAPHY;  Surgeon: Adrian Prows, MD;  Location: [place] CV LAB;  Service: Cardiovascular;  Laterality: N/A;   LOWER EXTREMITY ANGIOGRAM Bilateral 04/18/2012   Procedure: LOWER EXTREMITY ANGIOGRAM;  Surgeon: Angelia Mould, MD;  Location: [facility] CATH LAB;  Service: Cardiovascular;  Laterality: Bilateral;  bilat lower extrem angio   LOWER EXTREMITY ANGIOGRAPHY Bilateral 05/02/2019   Procedure: LOWER EXTREMITY ANGIOGRAPHY;  Surgeon: Adrian Prows, MD;  Location: [place] CV LAB;  Service: Cardiovascular;  Laterality: Bilateral;   LOWER EXTREMITY ANGIOGRAPHY Bilateral 02/28/2019   Procedure: LOWER EXTREMITY ANGIOGRAPHY;  Surgeon: Adrian Prows, MD;  Location: [place] CV LAB;  Service:  Cardiovascular;  Laterality: Bilateral;   macular photocoagulation     (eye treatments for diabetic retinopathy)-Dr. Zigmund Daniel   PERIPHERAL VASCULAR INTERVENTION  02/28/2019   Procedure: PERIPHERAL VASCULAR INTERVENTION;  Surgeon: Adrian Prows, MD;  Location: [place] CV LAB;  Service: Cardiovascular;;   TEE WITHOUT CARDIOVERSION N/A 03/18/2022   Procedure: TRANSESOPHAGEAL ECHOCARDIOGRAM (TEE);  Surgeon: Adrian Prows, MD;  Location: [facility] ENDOSCOPY;  Service: Cardiovascular;  Laterality: N/A;   VENTRICULAR ASSIST DEVICE INSERTION N/A 12/31/2020   Procedure: VENTRICULAR ASSIST DEVICE INSERTION;  Surgeon: Adrian Prows, MD;  Location: [place] CV LAB;  Service: Cardiovascular;  Laterality: N/A;   Patient Active Problem List   Diagnosis Date Noted   Chronic right-sided congestive heart failure ([place]) 05/18/2022   Ejection fraction < 50% 04/17/2022   Insomnia    Constipation    S/P BKA (below knee amputation), right ([place]) 03/21/2022   Ischemic cardiomyopathy    AKI (acute kidney injury) ([place]) 03/13/2022   Streptococcal bacteremia 03/13/2022   Hypophosphatemia 03/13/2022   Septic shock ([place]) 77/41/2878   Acute systolic heart failure ([place]) 12/28/2020   Acute pulmonary edema ([place]) 10/11/2020   Non-ST elevation (NSTEMI) myocardial infarction ([place]) 10/11/2020   Acute respiratory distress 10/11/2020   Acute on chronic combined systolic and diastolic CHF (congestive heart failure) ([place]) 10/11/2020   Cardiogenic shock (HCC)    Partial nontraumatic amputation of foot, right ([place]) 06/21/2019   Osteomyelitis of right foot ([place])    Essential hypertension, benign 02/06/2019   Hypercholesteremia 02/06/2019   Stage 3b chronic kidney disease (CKD) ([place]) 10/06/2017   Proteinuria 10/06/2017   Controlled type 2 diabetes mellitus with hyperglycemia, with long-term current use of insulin ([place]) 10/06/2017   Coronary artery disease involving native coronary artery of native heart without angina pectoris 05/23/2017    Abnormal nuclear stress test 09/06/2016   PAD (peripheral artery disease) ([place]) 10/28/2012   Morbid obesity with BMI of 40.0-44.9, adult ([place]) 10/13/2012   Diabetes mellitus with ophthalmic complication ([place]) 67/67/2094   Hypertension associated with diabetes ([place]) 10/13/2012   Atherosclerosis of native artery of extremity with intermittent claudication ([place]) 04/05/2012   Adenomatous colon polyp 09/28/2011   Diabetes mellitus ([place]) 09/28/2011   Pure hyperglyceridemia 09/28/2011   Erectile dysfunction 09/28/2011   Diabetic retinopathy ([place]) 09/28/2011    REFERRING DIAG: Z89.511 (ICD-10-CM) - S/P below knee amputation, right  ONSET DATE: 06/25/2022 prosthesis delivery  THERAPY DIAG:  Other abnormalities of gait and mobility  Unsteadiness on feet  Muscle weakness (generalized)  Abnormal posture  Pain in right lower leg  Rationale for Evaluation and Treatment Rehabilitation  PERTINENT HISTORY: combined systolic & diastolic CHF/cardiomyopathy EF 15-20%, DM2, PVD/PAD, CAD, CABG 2017, HTN, HLD, morbid obesity, CKD st3b, diabetic retinopathy  PRECAUTIONS: None  SUBJECTIVE:   He has been using rollator walker for long walk.  He has  been wearing 6hrs 2x/day except yesterday went 10hrs. His limb was tired & a little sore with standing after 10 hour wear.    PAIN:  Are you having pain?   0/10 today  OBJECTIVE: (objective measures completed at initial evaluation unless otherwise dated)  OBJECTIVE:    COGNITION: 06/30/2022  Overall cognitive status: Within functional limits for tasks assessed   POSTURE: 06/30/2022  rounded shoulders, forward head, and weight shift left   LOWER EXTREMITY ROM:   ROM P:passive  A:active Right Eval 06/30/2022 Left eval  Hip flexion      Hip extension      Hip abduction      Hip adduction      Hip internal rotation      Hip external rotation      Knee flexion      Knee extension 0*    Ankle dorsiflexion      Ankle plantarflexion      Ankle  inversion      Ankle eversion       (Blank rows = not tested)   LOWER EXTREMITY MMT:   MMT Right Eval 06/30/2022 Left eval  Hip flexion      Hip extension 4/5    Hip abduction 4/5    Hip adduction      Hip internal rotation      Hip external rotation      Knee flexion 4/5    Knee extension 4/5    Ankle dorsiflexion NA    Ankle plantarflexion NA    Ankle inversion NA    Ankle eversion NA    Evaluation testing functionally (Blank rows = not tested)   TRANSFERS: 06/30/2022  Sit to stand: SBA from chairs with armrests using back of LLE against chair to stabilize 06/30/2022   Stand to sit:  SBA using UEs to control descent   GAIT: 07/20/2022 Pt ambulates with step-through pattern with RW and Rt medial whip  06/30/2022 Gait pattern: step to pattern, decreased step length- Left, decreased stance time- Right, decreased stride length, decreased hip/knee flexion- Right, Right hip hike, antalgic, and abducted- Right Distance walked: 100' Assistive device utilized: Environmental consultant - 2 wheeled and TTA prosthesis Level of assistance: SBA  needs cues for gait deviations / no assist physically with RW support Comments: excessive UE weight bearing on RW to limit weight on prosthesis.    FUNCTIONAL TESTs:   Merrilee Jansky Balance Scale: 06/30/2022:   20/56   [facility] PT Assessment - 06/30/22 1345                Standardized Balance Assessment    Standardized Balance Assessment Berg Balance Test          Berg Balance Test    Sit to Stand Needs minimal aid to stand or to stabilize     Standing Unsupported Able to stand 2 minutes with supervision     Sitting with Back Unsupported but Feet Supported on Floor or Stool Able to sit safely and securely 2 minutes     Stand to Sit Controls descent by using hands     Transfers Able to transfer safely, definite need of hands     Standing Unsupported with Eyes Closed Unable to keep eyes closed 3 seconds but stays steady     Standing Unsupported with Feet Together Needs  help to attain position but able to stand for 30 seconds with feet together     From Standing, Reach Forward with Outstretched Arm Reaches forward but needs  supervision     From Standing Position, Pick up Object from Floor Unable to try/needs assist to keep balance     From Standing Position, Turn to Look Behind Over each Shoulder Needs supervision when turning     Turn 360 Degrees Needs assistance while turning     Standing Unsupported, Alternately Place Feet on Step/Stool Needs assistance to keep from falling or unable to try     Standing Unsupported, One Foot in [place] help to step but can hold 15 seconds     Standing on One Leg Tries to lift leg/unable to hold 3 seconds but remains standing independently     Total Score 20     Berg comment: BERG  < 36 high risk for falls (close to 100%) 46-51 moderate (>50%)   37-45 significant (>80%) 52-55 lower (> 25%)                   CURRENT PROSTHETIC WEAR ASSESSMENT: 06/30/2022 Patient is dependent with: skin check, residual limb care, prosthetic cleaning, ply sock cleaning, correct ply sock adjustment, proper wear schedule/adjustment, and proper weight-bearing schedule/adjustment Donning prosthesis: SBA Doffing prosthesis: SBA Prosthetic wear tolerance: 2 hours, 2x/day, 5 days since delivery Prosthetic weight bearing tolerance: 5 minutes with residual limb pain 3-4/10 Edema: pitting Residual limb condition:  scab medially 35m and dry scab lateral scar 310m  Good hair growth, normal color & temperature.  Cylindrical shape Prosthetic description: silicon liner with pin lock suspension, total contact socket, dynamic response foot K code/activity level with prosthetic use: Level 3     TODAY'S TREATMENT:  08/12/2022 Prosthetic Training with TTA prosthesis Wound 41m141m 41mm75mth no Tegaderm covering. No signs of infection.  Increase wear from arising to bedtime with 2hrs off mid-day, dry limb/liner half way.  Pt verbalized understanding. Pt  amb 200' X 3 with cane with supervision working on scanning maintaining path/pace and divided attention with cognitive task with scanning.  Pt amb 50' X 2 without device with supervision.  Initial steps with significant decreased stance duration on prosthesis but improved with tactile cues for weight shift & repetition.  PT demo & verbal cues on walking backwards with cane with supervision. Pt neg ramp & curb with cane with minA & verbal cues on technique.  PT recommended short / household walk without device, medium / basic community with cane (how to have girlfriend give HHA if notes fatigue) and long walk / max tolerable distance with rollator walker. Pt verbalized understanding.   Therapeutic Activity: PT demo carrying items with TTA prosthesis.    08/10/2022 Prosthetic Training with TTA prosthesis Wound 41mm 68mmm w73m no Tegaderm covering. No signs of infection.  Increase wear to 6hrs on, 2hrs off mid-day, dry limb/liner half way thru 6 hrs.  Pt verbalized understanding. Pt amb 200' X 2 with cane with minA to min guard working on scanning maintaining path/pace. PT demo & verbal cues on stepping over obstacle. Pt return demo with cane 4 reps with min guard.  PT demo & verbal cues on walking backwards & tandem.  Pt return demo with cane LUE & counter RUE. Pt neg ramp & curb with cane with minA & verbal cues on technique.   Therapeutic Activity: Sit to/from stand 18" chair without armrests 10 reps cues for midline to engage BLEs Step up & step down on BOSU round side up with BUE support, 2 step lead for momentum with BLEs 10 reps Lateral step up on BOSU round side  up with BUE support with contralateral LE adducting across midline ant & post with knee control, 5 reps ea LE.  PT demo & verbal cues on technique PT demo & verbal cues on lifting box. Pt return demo lifting box with supervision / chair behind for safety and lowering box to floor toke 3 reps until no LOB.     08/05/2022 Prosthetic  Training with TTA prosthesis Wound 74m X 286mwith Tegaderm covering. No signs of infection.  Increase wear to 4hrs on, 2hrs off, dry limb/liner half way thru 4 hrs.  Pt verbalized understanding. PT demo & verbal cues on technique with prosthesis. Step up & step down leading with both LEs 6" step with BUE support 10reps.  Stairs with 2 rails:  descend 8 steps with step to pattern using prosthesis / RLE.  3 steps alternating pattern 2 reps with verbal cues.   Ascend alternating pattern with verbal cues.   Driving options with Below Knee Prosthesis - pt verbalized understanding.   Option 1:  Remove prosthesis and use left leg to crossover drive Option 2:  2 Foot driving - right foot on gas & left foot on brake.  Prosthetic motion is leg press motion not ankle pump.  Focus is on heel pressing out not on toes/forefoot.   Option 3: Use prosthesis on both gas & brake.  Continue to focus on heel motion & position.  Option 4: Left foot accelerator.  Consider portable left foot accelerator ( http://plfa.org ) as allows to switch between cars or remove for other drivers.  Left foot both gas & brake.  Using cruise control to accelerate & decelerate or resume speed after stopping can safe some leg work / motion.   Practice initially in large empty parking lot. Stay in middle not near curbs. Practice turning & backing into parking spot on right & left. Parallel park.  Back up.  Quick stops. More about learning foot work not staying in motion.    Neuromuscular Re-ed: Standing hip width on foam eyes open & on floor eyes closed head motions right/left, up/down & diagonals with intermittent touch //bars.    HOME EXERCISE PROGRAM:  Access Code: [email or web address]: [email or web address] Date: 07/20/2022 Prepared by: RoJamey ReasExercises - Seated Quad Set  - 3-5 x daily - 7 x weekly - 2-3 sets - 10-20 reps - 5 seconds hold - Straight Leg Raise with External Rotation  - 3-5 x daily - 7 x weekly  - 2-3 sets - 10-20 reps - 5 seconds hold   Do each exercise 1-2  times per day Do each exercise 5-10 repetitions Hold each exercise for 2 seconds to feel your location  [place] Try to find this position when standing still for activities.   USE TAPE ON FLOOR TO MARK THE MIDLINE POSITION which is even with middle of sink.  You also should try to feel with your limb pressure in socket.  You are trying to feel with limb what you used to feel with the bottom of your foot.  Side to Side Shift: Moving your hips only (not shoulders): move weight onto your left leg, HOLD/FEEL pressure in socket.  Move back to equal weight on each leg, HOLD/FEEL pressure in socket. Move weight onto your right leg, HOLD/FEEL pressure in socket. Move back to equal weight on each leg, HOLD/FEEL pressure in socket. Repeat.  Start with both hands on sink, progress  to hand on prosthetic side only, then no hands.  Front to Back Shift: Moving your hips only (not shoulders): move your weight forward onto your toes, HOLD/FEEL pressure in socket. Move your weight back to equal Flat Foot on both legs, HOLD/FEEL  pressure in socket. Move your weight back onto your heels, HOLD/FEEL  pressure in socket. Move your weight back to equal on both legs, HOLD/FEEL  pressure in socket. Repeat.  Start with both hands on sink, progress to hand on prosthetic side only, then no hands.  Moving Cones / Cups: With equal weight on each leg: Hold on with one hand the first time, then progress to no hand supports. Move cups from one side of sink to the other. Place cups ~2" out of your reach, progress to 10" beyond reach.  Place one hand in middle of sink and reach with other hand. Do both arms.  Then hover one hand and move cups with other hand.  Overhead/Upward Reaching: alternated reaching up to top cabinets or ceiling if no cabinets present. Keep equal weight on each leg. Start  with one hand support on counter while other hand reaches and progress to no hand support with reaching.  ace one hand in middle of sink and reach with other hand. Do both arms.  Then hover one hand and move cups with other hand.  5.   Looking Over Shoulders: With equal weight on each leg: alternate turning to look over your shoulders with one hand support on counter as needed.  Start with head motions only to look in front of shoulder, then even with shoulder and progress to looking behind you. To look to side, move head /eyes, then shoulder on side looking pulls back, shift more weight to side looking and pull hip back. Place one hand in middle of sink and let go with other hand so your shoulder can pull back. Switch hands to look other way.   Then hover one hand and look over shoulder. If looking right, use left hand at sink. If looking left, use right hand at sink. 6.  Stepping with leg that is not amputated:  Move items under cabinet out of your way. Shift your hips/pelvis so weight on prosthesis. Tighten muscles in hip on prosthetic side.  SLOWLY step other leg so front of foot is in cabinet. Then step back to floor.       ASSESSMENT: CLINICAL IMPRESSION: Patient is slowly improving his mobility with his prosthesis. He is tolerating increased wear & weight bearing activities with wound continue to heal.  He continues to benefit from skilled PT to improve function & safety.   OBJECTIVE IMPAIRMENTS Abnormal gait, cardiopulmonary status limiting activity, decreased activity tolerance, decreased balance, decreased endurance, decreased knowledge of condition, decreased knowledge of use of DME, decreased mobility, difficulty walking, decreased strength, increased edema, postural dysfunction, prosthetic dependency , obesity, pain, and skin integrity issues on residual limb .    ACTIVITY LIMITATIONS carrying, lifting, bending, sitting, standing, squatting, stairs, transfers, locomotion level, and safe  prosthesis use   PARTICIPATION LIMITATIONS: driving, community activity, and recreational activities   [place], Time since onset of injury/illness/exacerbation, and 3+ comorbidities: see PMH  are also affecting patient's functional outcome.    REHAB POTENTIAL: Good   CLINICAL DECISION MAKING: Evolving/moderate complexity   EVALUATION COMPLEXITY: Moderate     GOALS: Goals reviewed with patient? Yes   SHORT TERM GOALS: Target date: 08/27/2022   Patient verbalizes adjusting ply socks & sweat management.  Baseline: SEE OBJECTIVE DATA Goal status: ongoing 08/03/2022 2.  Patient tolerates prosthesis >12 hrs total /day with no skin issues or limb pain after standing. Baseline: SEE OBJECTIVE DATA Goal status: ongoing 08/03/2022   3.  Berg Balance Test >/= 36/56 Baseline: SEE OBJECTIVE DATA Goal status: ongoing 08/03/2022   4. Patient ambulates 200' with cane & prosthesis with supervision. Baseline: SEE OBJECTIVE DATA Goal status: ongoing 08/03/2022   5. Patient negotiates ramps & curbs with cane & prosthesis with minA. Baseline: SEE OBJECTIVE DATA Goal status:  ongoing 08/03/2022     LONG TERM GOALS: Target date: 09/24/2022   Patient demonstrates & verbalized understanding of prosthetic care to enable safe utilization of prosthesis. Baseline: SEE OBJECTIVE DATA Goal status: INITIAL   Patient tolerates prosthesis wear >90% of awake hours without skin or limb pain issues. Baseline: SEE OBJECTIVE DATA Goal status: INITIAL   Berg Balance >/= 40/56  to indicate lower fall risk Baseline: SEE OBJECTIVE DATA Goal status: INITIAL   Patient ambulates >500' with prosthesis & cane or less independently Baseline: SEE OBJECTIVE DATA Goal status: INITIAL   Patient negotiates ramps, curbs & stairs with single rail with prosthesis cane or less independently. Baseline: SEE OBJECTIVE DATA Goal status: INITIAL   Patient demonstrates & verbalizes climbing ladder, lifting,  carrying, pushing, pulling with prosthesis safely. Baseline: SEE OBJECTIVE DATA Goal status: INITIAL   Patient verbalizes & demonstrates understanding of fishing simulations.  Baseline: SEE OBJECTIVE DATA Goal status: INITIAL   PLAN: PT FREQUENCY: 2x/week   PT DURATION: 90 days / 13 weeks   PLANNED INTERVENTIONS: Therapeutic exercises, Therapeutic activity, Neuromuscular re-education, Balance training, Gait training, Patient/Family education, Stair training, Vestibular training, Prosthetic training, DME instructions, and Physical Performance Testing   PLAN FOR NEXT SESSION:   progress prosthetic gait with cane including ramp & curb for community function & no device for household.  Standing balance including compliant surfaces. Therapeutic exercise with functional focus.  Jamey Reas, PT, DPT 08/12/2022, 2:12 PM

## 2022-08-17 ENCOUNTER — Encounter: Payer: Self-pay | Admitting: Physical Therapy

## 2022-08-17 ENCOUNTER — Ambulatory Visit (INDEPENDENT_AMBULATORY_CARE_PROVIDER_SITE_OTHER): Payer: Medicare Other | Admitting: Physical Therapy

## 2022-08-17 DIAGNOSIS — M79661 Pain in right lower leg: Secondary | ICD-10-CM | POA: Diagnosis not present

## 2022-08-17 DIAGNOSIS — R293 Abnormal posture: Secondary | ICD-10-CM

## 2022-08-17 DIAGNOSIS — R2689 Other abnormalities of gait and mobility: Secondary | ICD-10-CM | POA: Diagnosis not present

## 2022-08-17 DIAGNOSIS — R2681 Unsteadiness on feet: Secondary | ICD-10-CM

## 2022-08-17 DIAGNOSIS — M6281 Muscle weakness (generalized): Secondary | ICD-10-CM | POA: Diagnosis not present

## 2022-08-17 NOTE — Therapy (Signed)
OUTPATIENT PHYSICAL THERAPY TREATMENT NOTE  Patient Name: Eduardo [name] MRN: [id number] DOB:12-May-1956, 66 y.o., male Today's Date: 08/17/2022  PCP: Rita Ohara, MD REFERRING PROVIDER: Newt Minion, MD      END OF SESSION:   PT End of Session - 08/17/22 1018     Visit Number 12    Number of Visits 26    Date for PT Re-Evaluation 09/24/22    Authorization Type [employer] Medicare    Authorization Time Period $20 copay    Progress Note Due on Visit 20    PT Start Time 1015    PT Stop Time 1102    PT Time Calculation (min) 47 min    Equipment Utilized During Treatment Gait belt    Activity Tolerance Patient tolerated treatment well;Patient limited by pain    Behavior During Therapy WFL for tasks assessed/performed                   Past Medical History:  Diagnosis Date   CHF (congestive heart failure) ([place])    Chronic kidney disease    stage 3   Colon polyp    Coronary artery disease    Diabetes mellitus 1987   under care of Dr. Chalmers Cater.  On insulin since 96 (off and on)   Diabetic retinopathy    Dupuytren contracture    R hand, s/p injection (Dr. Lenon Curt)   Essential hypertension, benign    Essential hypertension, benign 02/06/2019   Frequency of urination and polyuria    Hypertension    Myocardial infarction [facility])    denies   Neuromuscular disorder ([place])    Diabetic neuropathy   Osteomyelitis ([place])    right foot   Other testicular hypofunction    Peripheral arterial disease ([place]) 10/28/2012   Peritoneal abscess ([place]) 6/08   and buttock.   Pneumonia    Polydipsia    Proteinuria    Pure hyperglyceridemia    Subacute osteomyelitis, right ankle and foot ([place])    Wears glasses    Past Surgical History:  Procedure Laterality Date   ABDOMINAL AORTAGRAM N/A 04/18/2012   Procedure: ABDOMINAL Maxcine Ham;  Surgeon: Angelia Mould, MD;  Location: [facility] CATH LAB;  Service: Cardiovascular;  Laterality: N/A;   AMPUTATION Right 05/19/2019   Procedure: RIGHT FOOT 5TH  RAY AMPUTATION;  Surgeon: Newt Minion, MD;  Location: [place];  Service: Orthopedics;  Laterality: Right;   AMPUTATION Right 03/11/2022   Procedure: RIGHT LEG DEBRIDEMENT VS. BELOW KNEE AMPUTATION;  Surgeon: Newt Minion, MD;  Location: [place];  Service: Orthopedics;  Laterality: Right;   CARDIAC CATHETERIZATION N/A 09/08/2016   Procedure: Left Heart Cath and Coronary Angiography;  Surgeon: Adrian Prows, MD;  Location: [place] CV LAB;  Service: Cardiovascular;  Laterality: N/A;   CATARACT EXTRACTION, BILATERAL  09/2017, 10/2017   Dr. Herbert Deaner   COLONOSCOPY W/ BIOPSIES AND POLYPECTOMY     CORONARY/GRAFT ACUTE MI REVASCULARIZATION N/A 10/11/2020   Procedure: Coronary/Graft Acute MI Revascularization;  Surgeon: Adrian Prows, MD;  Location: [place] CV LAB;  Service: Cardiovascular;  Laterality: N/A;   I & D EXTREMITY Right 03/11/2022   Procedure: BELOW KNEE AMPUTATION;  Surgeon: Newt Minion, MD;  Location: [place];  Service: Orthopedics;  Laterality: Right;   LEFT HEART CATH N/A 12/31/2020   Procedure: Left Heart Cath;  Surgeon: Adrian Prows, MD;  Location: [place] CV LAB;  Service: Cardiovascular;  Laterality: N/A;   LEFT HEART CATH AND CORONARY ANGIOGRAPHY N/A 10/11/2020  Procedure: LEFT HEART CATH AND CORONARY ANGIOGRAPHY;  Surgeon: Adrian Prows, MD;  Location: [place] CV LAB;  Service: Cardiovascular;  Laterality: N/A;   LOWER EXTREMITY ANGIOGRAM Bilateral 04/18/2012   Procedure: LOWER EXTREMITY ANGIOGRAM;  Surgeon: Angelia Mould, MD;  Location: [facility] CATH LAB;  Service: Cardiovascular;  Laterality: Bilateral;  bilat lower extrem angio   LOWER EXTREMITY ANGIOGRAPHY Bilateral 05/02/2019   Procedure: LOWER EXTREMITY ANGIOGRAPHY;  Surgeon: Adrian Prows, MD;  Location: [place] [facility] LAB;  Service: Cardiovascular;  Laterality: Bilateral;   LOWER EXTREMITY ANGIOGRAPHY Bilateral 02/28/2019   Procedure: LOWER EXTREMITY ANGIOGRAPHY;  Surgeon: Adrian Prows, MD;  Location: [place] CV LAB;  Service:  Cardiovascular;  Laterality: Bilateral;   macular photocoagulation     (eye treatments for diabetic retinopathy)-Dr. Zigmund Daniel   PERIPHERAL VASCULAR INTERVENTION  02/28/2019   Procedure: PERIPHERAL VASCULAR INTERVENTION;  Surgeon: Adrian Prows, MD;  Location: [place] CV LAB;  Service: Cardiovascular;;   TEE WITHOUT CARDIOVERSION N/A 03/18/2022   Procedure: TRANSESOPHAGEAL ECHOCARDIOGRAM (TEE);  Surgeon: Adrian Prows, MD;  Location: [facility] ENDOSCOPY;  Service: Cardiovascular;  Laterality: N/A;   VENTRICULAR ASSIST DEVICE INSERTION N/A 12/31/2020   Procedure: VENTRICULAR ASSIST DEVICE INSERTION;  Surgeon: Adrian Prows, MD;  Location: [place] CV LAB;  Service: Cardiovascular;  Laterality: N/A;   Patient Active Problem List   Diagnosis Date Noted   Chronic right-sided congestive heart failure ([place]) 05/18/2022   Ejection fraction < 50% 04/17/2022   Insomnia    Constipation    S/P BKA (below knee amputation), right ([place]) 03/21/2022   Ischemic cardiomyopathy    AKI (acute kidney injury) ([place]) 03/13/2022   Streptococcal bacteremia 03/13/2022   Hypophosphatemia 03/13/2022   Septic shock ([place]) 00/86/7619   Acute systolic heart failure ([place]) 12/28/2020   Acute pulmonary edema ([place]) 10/11/2020   Non-ST elevation (NSTEMI) myocardial infarction ([place]) 10/11/2020   Acute respiratory distress 10/11/2020   Acute on chronic combined systolic and diastolic CHF (congestive heart failure) ([place]) 10/11/2020   Cardiogenic shock (H[facility])    Partial nontraumatic amputation of foot, right ([place]) 06/21/2019   Osteomyelitis of right foot ([place])    Essential hypertension, benign 02/06/2019   Hypercholesteremia 02/06/2019   Stage 3b chronic kidney disease (CKD) ([place]) 10/06/2017   Proteinuria 10/06/2017   Controlled type 2 diabetes mellitus with hyperglycemia, with long-term current use of insulin ([place]) 10/06/2017   Coronary artery disease involving native coronary artery of native heart without angina pectoris 05/23/2017    Abnormal nuclear stress test 09/06/2016   PAD (peripheral artery disease) ([place]) 10/28/2012   Morbid obesity with BMI of 40.0-44.9, adult ([place]) 10/13/2012   Diabetes mellitus with ophthalmic complication ([place]) 50/93/2671   Hypertension associated with diabetes ([place]) 10/13/2012   Atherosclerosis of native artery of extremity with intermittent claudication ([place]) 04/05/2012   Adenomatous colon polyp 09/28/2011   Diabetes mellitus ([place]) 09/28/2011   Pure hyperglyceridemia 09/28/2011   Erectile dysfunction 09/28/2011   Diabetic retinopathy ([place]) 09/28/2011    REFERRING DIAG: Z89.511 (ICD-10-CM) - S/P below knee amputation, right  ONSET DATE: 06/25/2022 prosthesis delivery  THERAPY DIAG:  Other abnormalities of gait and mobility  Unsteadiness on feet  Muscle weakness (generalized)  Abnormal posture  Pain in right lower leg  Rationale for Evaluation and Treatment Rehabilitation  PERTINENT HISTORY: combined systolic & diastolic CHF/cardiomyopathy EF 15-20%, DM2, PVD/PAD, CAD, CABG 2017, HTN, HLD, morbid obesity, CKD st3b, diabetic retinopathy  PRECAUTIONS: None  SUBJECTIVE:   He is wearing prosthesis all awake hours except 2 hours midday.  He is increasing socks. He had trouble getting some of his long pants over the prosthesis.    PAIN:  Are you having pain?   0/10 today  OBJECTIVE: (objective measures completed at initial evaluation unless otherwise dated)  OBJECTIVE:    COGNITION: 06/30/2022  Overall cognitive status: Within functional limits for tasks assessed   POSTURE: 06/30/2022  rounded shoulders, forward head, and weight shift left   LOWER EXTREMITY ROM:   ROM P:passive  A:active Right Eval 06/30/2022 Left eval  Hip flexion      Hip extension      Hip abduction      Hip adduction      Hip internal rotation      Hip external rotation      Knee flexion      Knee extension 0*    Ankle dorsiflexion      Ankle plantarflexion      Ankle inversion      Ankle  eversion       (Blank rows = not tested)   LOWER EXTREMITY MMT:   MMT Right Eval 06/30/2022 Left eval  Hip flexion      Hip extension 4/5    Hip abduction 4/5    Hip adduction      Hip internal rotation      Hip external rotation      Knee flexion 4/5    Knee extension 4/5    Ankle dorsiflexion NA    Ankle plantarflexion NA    Ankle inversion NA    Ankle eversion NA    Evaluation testing functionally (Blank rows = not tested)   TRANSFERS: 06/30/2022  Sit to stand: SBA from chairs with armrests using back of LLE against chair to stabilize 06/30/2022   Stand to sit:  SBA using UEs to control descent   GAIT: 07/20/2022 Pt ambulates with step-through pattern with RW and Rt medial whip  06/30/2022 Gait pattern: step to pattern, decreased step length- Left, decreased stance time- Right, decreased stride length, decreased hip/knee flexion- Right, Right hip hike, antalgic, and abducted- Right Distance walked: 100' Assistive device utilized: Environmental consultant - 2 wheeled and TTA prosthesis Level of assistance: SBA  needs cues for gait deviations / no assist physically with RW support Comments: excessive UE weight bearing on RW to limit weight on prosthesis.    FUNCTIONAL TESTs:   Merrilee Jansky Balance Scale: 06/30/2022:   20/56   [facility] PT Assessment - 06/30/22 1345                Standardized Balance Assessment    Standardized Balance Assessment Berg Balance Test          Berg Balance Test    Sit to Stand Needs minimal aid to stand or to stabilize     Standing Unsupported Able to stand 2 minutes with supervision     Sitting with Back Unsupported but Feet Supported on Floor or Stool Able to sit safely and securely 2 minutes     Stand to Sit Controls descent by using hands     Transfers Able to transfer safely, definite need of hands     Standing Unsupported with Eyes Closed Unable to keep eyes closed 3 seconds but stays steady     Standing Unsupported with Feet Together Needs help to attain  position but able to stand for 30 seconds with feet together     From Standing, Reach Forward with Outstretched Arm Reaches forward but needs supervision     From  Standing Position, Pick up Object from Floor Unable to try/needs assist to keep balance     From Standing Position, Turn to Look Behind Over each Shoulder Needs supervision when turning     Turn 360 Degrees Needs assistance while turning     Standing Unsupported, Alternately Place Feet on Step/Stool Needs assistance to keep from falling or unable to try     Standing Unsupported, One Foot in [place] help to step but can hold 15 seconds     Standing on One Leg Tries to lift leg/unable to hold 3 seconds but remains standing independently     Total Score 20     Berg comment: BERG  < 36 high risk for falls (close to 100%) 46-51 moderate (>50%)   37-45 significant (>80%) 52-55 lower (> 25%)                   CURRENT PROSTHETIC WEAR ASSESSMENT: 06/30/2022 Patient is dependent with: skin check, residual limb care, prosthetic cleaning, ply sock cleaning, correct ply sock adjustment, proper wear schedule/adjustment, and proper weight-bearing schedule/adjustment Donning prosthesis: SBA Doffing prosthesis: SBA Prosthetic wear tolerance: 2 hours, 2x/day, 5 days since delivery Prosthetic weight bearing tolerance: 5 minutes with residual limb pain 3-4/10 Edema: pitting Residual limb condition:  scab medially 50m and dry scab lateral scar 343m  Good hair growth, normal color & temperature.  Cylindrical shape Prosthetic description: silicon liner with pin lock suspension, total contact socket, dynamic response foot K code/activity level with prosthetic use: Level 3     TODAY'S TREATMENT:  08/17/2022 Prosthetic Training with TTA prosthesis Wound 93m96m 93mm42mth no Tegaderm covering. No signs of infection.  Increase wear from arising to bedtime with 1 hr off mid-day, dry limb/liner half way.  Pt verbalized understanding. PT verbal cues on  donning long pants with prosthesis, changing shoes, using wader or water proof vac bag, weighing in morning as MD recommended with prosthesis & fall risk when prosthesis off.  Pt verbalized understanding.  Pt amb 200' X 2 with cane with supervision working on scanning maintaining path/pace, walking eyes closed 3 steps to work on walking in dark & increasing speed to "cross a street." Pt amb 50' X 2 without device with supervision.  Pt walking backwards with cane with supervision. Pt neg ramp & curb with cane with supervision & verbal cues on technique.   08/12/2022 Prosthetic Training with TTA prosthesis Wound 93mm 80mmm w27m no Tegaderm covering. No signs of infection.  Increase wear from arising to bedtime with 2hrs off mid-day, dry limb/liner half way.  Pt verbalized understanding. Pt amb 200' X 3 with cane with supervision working on scanning maintaining path/pace and divided attention with cognitive task with scanning.  Pt amb 50' X 2 without device with supervision.  Initial steps with significant decreased stance duration on prosthesis but improved with tactile cues for weight shift & repetition.  PT demo & verbal cues on walking backwards with cane with supervision. Pt neg ramp & curb with cane with minA & verbal cues on technique.  PT recommended short / household walk without device, medium / basic community with cane (how to have girlfriend give HHA if notes fatigue) and long walk / max tolerable distance with rollator walker. Pt verbalized understanding.   Therapeutic Activity: PT demo carrying items with TTA prosthesis.    08/10/2022 Prosthetic Training with TTA prosthesis Wound 93mm X 57m wit25mo Tegaderm covering. No signs of infection.  Increase wear to 6hrs  on, 2hrs off mid-day, dry limb/liner half way thru 6 hrs.  Pt verbalized understanding. Pt amb 200' X 2 with cane with minA to min guard working on scanning maintaining path/pace. PT demo & verbal cues on stepping over  obstacle. Pt return demo with cane 4 reps with min guard.  PT demo & verbal cues on walking backwards & tandem.  Pt return demo with cane LUE & counter RUE. Pt neg ramp & curb with cane with minA & verbal cues on technique.   Therapeutic Activity: Sit to/from stand 18" chair without armrests 10 reps cues for midline to engage BLEs Step up & step down on BOSU round side up with BUE support, 2 step lead for momentum with BLEs 10 reps Lateral step up on BOSU round side up with BUE support with contralateral LE adducting across midline ant & post with knee control, 5 reps ea LE.  PT demo & verbal cues on technique PT demo & verbal cues on lifting box. Pt return demo lifting box with supervision / chair behind for safety and lowering box to floor toke 3 reps until no LOB.     HOME EXERCISE PROGRAM:  Access Code: [email or web address] URL: [email or web address] Date: 07/20/2022 Prepared by: Jamey Reas  Exercises - Seated Quad Set  - 3-5 x daily - 7 x weekly - 2-3 sets - 10-20 reps - 5 seconds hold - Straight Leg Raise with External Rotation  - 3-5 x daily - 7 x weekly - 2-3 sets - 10-20 reps - 5 seconds hold   Do each exercise 1-2  times per day Do each exercise 5-10 repetitions Hold each exercise for 2 seconds to feel your location  AT [place].  Try to find this position when standing still for activities.   USE TAPE ON FLOOR TO MARK THE MIDLINE POSITION which is even with middle of sink.  You also should try to feel with your limb pressure in socket.  You are trying to feel with limb what you used to feel with the bottom of your foot.  Side to Side Shift: Moving your hips only (not shoulders): move weight onto your left leg, HOLD/FEEL pressure in socket.  Move back to equal weight on each leg, HOLD/FEEL pressure in socket. Move weight onto your right leg, HOLD/FEEL pressure in socket. Move back to equal weight on  each leg, HOLD/FEEL pressure in socket. Repeat.  Start with both hands on sink, progress to hand on prosthetic side only, then no hands.  Front to Back Shift: Moving your hips only (not shoulders): move your weight forward onto your toes, HOLD/FEEL pressure in socket. Move your weight back to equal Flat Foot on both legs, HOLD/FEEL  pressure in socket. Move your weight back onto your heels, HOLD/FEEL  pressure in socket. Move your weight back to equal on both legs, HOLD/FEEL  pressure in socket. Repeat.  Start with both hands on sink, progress to hand on prosthetic side only, then no hands.  Moving Cones / Cups: With equal weight on each leg: Hold on with one hand the first time, then progress to no hand supports. Move cups from one side of sink to the other. Place cups ~2" out of your reach, progress to 10" beyond reach.  Place one hand in middle of sink and reach with other hand. Do both arms.  Then hover one hand and move cups with other hand.  Overhead/Upward Reaching: alternated  reaching up to top cabinets or ceiling if no cabinets present. Keep equal weight on each leg. Start with one hand support on counter while other hand reaches and progress to no hand support with reaching.  ace one hand in middle of sink and reach with other hand. Do both arms.  Then hover one hand and move cups with other hand.  5.   Looking Over Shoulders: With equal weight on each leg: alternate turning to look over your shoulders with one hand support on counter as needed.  Start with head motions only to look in front of shoulder, then even with shoulder and progress to looking behind you. To look to side, move head /eyes, then shoulder on side looking pulls back, shift more weight to side looking and pull hip back. Place one hand in middle of sink and let go with other hand so your shoulder can pull back. Switch hands to look other way.   Then hover one hand and look over shoulder. If looking right, use left hand at sink. If  looking left, use right hand at sink. 6.  Stepping with leg that is not amputated:  Move items under cabinet out of your way. Shift your hips/pelvis so weight on prosthesis. Tighten muscles in hip on prosthetic side.  SLOWLY step other leg so front of foot is in cabinet. Then step back to floor.       ASSESSMENT: CLINICAL IMPRESSION: Patient appears to have better understanding of higher level prosthetic issues instructed today.  His balance with cane for community activities was improved also.  Pt reports improved function in home & community using his prosthesis as PT has instructed.     OBJECTIVE IMPAIRMENTS Abnormal gait, cardiopulmonary status limiting activity, decreased activity tolerance, decreased balance, decreased endurance, decreased knowledge of condition, decreased knowledge of use of DME, decreased mobility, difficulty walking, decreased strength, increased edema, postural dysfunction, prosthetic dependency , obesity, pain, and skin integrity issues on residual limb .    ACTIVITY LIMITATIONS carrying, lifting, bending, sitting, standing, squatting, stairs, transfers, locomotion level, and safe prosthesis use   PARTICIPATION LIMITATIONS: driving, community activity, and recreational activities   Advance, Time since onset of injury/illness/exacerbation, and 3+ comorbidities: see PMH  are also affecting patient's functional outcome.    REHAB POTENTIAL: Good   CLINICAL DECISION MAKING: Evolving/moderate complexity   EVALUATION COMPLEXITY: Moderate     GOALS: Goals reviewed with patient? Yes   SHORT TERM GOALS: Target date: 08/27/2022   Patient verbalizes adjusting ply socks & sweat management.  Baseline: SEE OBJECTIVE DATA Goal status: ongoing 08/03/2022 2.  Patient tolerates prosthesis >12 hrs total /day with no skin issues or limb pain after standing. Baseline: SEE OBJECTIVE DATA Goal status: ongoing 08/03/2022   3.  Berg Balance Test >/=  36/56 Baseline: SEE OBJECTIVE DATA Goal status: ongoing 08/03/2022   4. Patient ambulates 200' with cane & prosthesis with supervision. Baseline: SEE OBJECTIVE DATA Goal status: ongoing 08/03/2022   5. Patient negotiates ramps & curbs with cane & prosthesis with minA. Baseline: SEE OBJECTIVE DATA Goal status:  ongoing 08/03/2022     LONG TERM GOALS: Target date: 09/24/2022   Patient demonstrates & verbalized understanding of prosthetic care to enable safe utilization of prosthesis. Baseline: SEE OBJECTIVE DATA Goal status: INITIAL   Patient tolerates prosthesis wear >90% of awake hours without skin or limb pain issues. Baseline: SEE OBJECTIVE DATA Goal status: INITIAL   Berg Balance >/= 40/56  to indicate lower fall risk  Baseline: SEE OBJECTIVE DATA Goal status: INITIAL   Patient ambulates >500' with prosthesis & cane or less independently Baseline: SEE OBJECTIVE DATA Goal status: INITIAL   Patient negotiates ramps, curbs & stairs with single rail with prosthesis cane or less independently. Baseline: SEE OBJECTIVE DATA Goal status: INITIAL   Patient demonstrates & verbalizes climbing ladder, lifting, carrying, pushing, pulling with prosthesis safely. Baseline: SEE OBJECTIVE DATA Goal status: INITIAL   Patient verbalizes & demonstrates understanding of fishing simulations.  Baseline: SEE OBJECTIVE DATA Goal status: INITIAL   PLAN: PT FREQUENCY: 2x/week   PT DURATION: 90 days / 13 weeks   PLANNED INTERVENTIONS: Therapeutic exercises, Therapeutic activity, Neuromuscular re-education, Balance training, Gait training, Patient/Family education, Stair training, Vestibular training, Prosthetic training, DME instructions, and Physical Performance Testing   PLAN FOR NEXT SESSION:  check STGs.  Advance balance & gait activities.  Jamey Reas, PT, DPT 08/17/2022, 12:05 PM

## 2022-08-18 DIAGNOSIS — E1165 Type 2 diabetes mellitus with hyperglycemia: Secondary | ICD-10-CM | POA: Diagnosis not present

## 2022-08-19 ENCOUNTER — Ambulatory Visit (INDEPENDENT_AMBULATORY_CARE_PROVIDER_SITE_OTHER): Payer: Medicare Other | Admitting: Physical Therapy

## 2022-08-19 ENCOUNTER — Ambulatory Visit: Payer: Medicare Other | Admitting: Internal Medicine

## 2022-08-19 ENCOUNTER — Encounter: Payer: Self-pay | Admitting: Physical Therapy

## 2022-08-19 ENCOUNTER — Encounter: Payer: Self-pay | Admitting: Internal Medicine

## 2022-08-19 ENCOUNTER — Ambulatory Visit: Payer: Medicare Other | Admitting: Student

## 2022-08-19 VITALS — BP 102/61 | HR 81 | Temp 97.1°F | Resp 16 | Ht 70.0 in | Wt 241.0 lb

## 2022-08-19 DIAGNOSIS — M6281 Muscle weakness (generalized): Secondary | ICD-10-CM

## 2022-08-19 DIAGNOSIS — I5022 Chronic systolic (congestive) heart failure: Secondary | ICD-10-CM | POA: Diagnosis not present

## 2022-08-19 DIAGNOSIS — R2689 Other abnormalities of gait and mobility: Secondary | ICD-10-CM | POA: Diagnosis not present

## 2022-08-19 DIAGNOSIS — M79661 Pain in right lower leg: Secondary | ICD-10-CM

## 2022-08-19 DIAGNOSIS — R293 Abnormal posture: Secondary | ICD-10-CM

## 2022-08-19 DIAGNOSIS — R2681 Unsteadiness on feet: Secondary | ICD-10-CM

## 2022-08-19 DIAGNOSIS — I447 Left bundle-branch block, unspecified: Secondary | ICD-10-CM | POA: Diagnosis not present

## 2022-08-19 DIAGNOSIS — I255 Ischemic cardiomyopathy: Secondary | ICD-10-CM

## 2022-08-19 NOTE — Therapy (Signed)
OUTPATIENT PHYSICAL THERAPY TREATMENT NOTE  Patient Name: Eduardo [name] MRN: [id number] DOB:Sep 29, 1956, 66 y.o., male Today's Date: 08/19/2022  PCP: Rita Ohara, MD REFERRING PROVIDER: Newt Minion, MD      END OF SESSION:   PT End of Session - 08/19/22 1157     Visit Number 13    Number of Visits 26    Date for PT Re-Evaluation 09/24/22    Authorization Type [employer] Medicare    Authorization Time Period $20 copay    Progress Note Due on Visit 20    PT Start Time 1150    PT Stop Time 1230    PT Time Calculation (min) 40 min    Equipment Utilized During Treatment Gait belt    Activity Tolerance Patient tolerated treatment well;Patient limited by pain    Behavior During Therapy WFL for tasks assessed/performed                    Past Medical History:  Diagnosis Date   CHF (congestive heart failure) ([place])    Chronic kidney disease    stage 3   Colon polyp    Coronary artery disease    Diabetes mellitus 1987   under care of Dr. Chalmers Cater.  On insulin since 96 (off and on)   Diabetic retinopathy    Dupuytren contracture    R hand, s/p injection (Dr. Lenon Curt)   Essential hypertension, benign    Essential hypertension, benign 02/06/2019   Frequency of urination and polyuria    Hypertension    Myocardial infarction [facility])    denies   Neuromuscular disorder ([place])    Diabetic neuropathy   Osteomyelitis ([place])    right foot   Other testicular hypofunction    Peripheral arterial disease ([place]) 10/28/2012   Peritoneal abscess ([place]) 6/08   and buttock.   Pneumonia    Polydipsia    Proteinuria    Pure hyperglyceridemia    Subacute osteomyelitis, right ankle and foot ([place])    Wears glasses    Past Surgical History:  Procedure Laterality Date   ABDOMINAL AORTAGRAM N/A 04/18/2012   Procedure: ABDOMINAL Maxcine Ham;  Surgeon: Angelia Mould, MD;  Location: [facility] CATH LAB;  Service: Cardiovascular;  Laterality: N/A;   AMPUTATION Right 05/19/2019   Procedure: RIGHT FOOT  5TH RAY AMPUTATION;  Surgeon: Newt Minion, MD;  Location: [place];  Service: Orthopedics;  Laterality: Right;   AMPUTATION Right 03/11/2022   Procedure: RIGHT LEG DEBRIDEMENT VS. BELOW KNEE AMPUTATION;  Surgeon: Newt Minion, MD;  Location: [place];  Service: Orthopedics;  Laterality: Right;   CARDIAC CATHETERIZATION N/A 09/08/2016   Procedure: Left Heart Cath and Coronary Angiography;  Surgeon: Adrian Prows, MD;  Location: [place] CV LAB;  Service: Cardiovascular;  Laterality: N/A;   CATARACT EXTRACTION, BILATERAL  09/2017, 10/2017   Dr. Herbert Deaner   COLONOSCOPY W/ BIOPSIES AND POLYPECTOMY     CORONARY/GRAFT ACUTE MI REVASCULARIZATION N/A 10/11/2020   Procedure: Coronary/Graft Acute MI Revascularization;  Surgeon: Adrian Prows, MD;  Location: [place] CV LAB;  Service: Cardiovascular;  Laterality: N/A;   I & D EXTREMITY Right 03/11/2022   Procedure: BELOW KNEE AMPUTATION;  Surgeon: Newt Minion, MD;  Location: [place];  Service: Orthopedics;  Laterality: Right;   LEFT HEART CATH N/A 12/31/2020   Procedure: Left Heart Cath;  Surgeon: Adrian Prows, MD;  Location: [place] CV LAB;  Service: Cardiovascular;  Laterality: N/A;   LEFT HEART CATH AND CORONARY ANGIOGRAPHY N/A 10/11/2020  Procedure: LEFT HEART CATH AND CORONARY ANGIOGRAPHY;  Surgeon: Adrian Prows, MD;  Location: [place] CV LAB;  Service: Cardiovascular;  Laterality: N/A;   LOWER EXTREMITY ANGIOGRAM Bilateral 04/18/2012   Procedure: LOWER EXTREMITY ANGIOGRAM;  Surgeon: Angelia Mould, MD;  Location: [facility] CATH LAB;  Service: Cardiovascular;  Laterality: Bilateral;  bilat lower extrem angio   LOWER EXTREMITY ANGIOGRAPHY Bilateral 05/02/2019   Procedure: LOWER EXTREMITY ANGIOGRAPHY;  Surgeon: Adrian Prows, MD;  Location: [place] CV LAB;  Service: Cardiovascular;  Laterality: Bilateral;   LOWER EXTREMITY ANGIOGRAPHY Bilateral 02/28/2019   Procedure: LOWER EXTREMITY ANGIOGRAPHY;  Surgeon: Adrian Prows, MD;  Location: [place] CV LAB;   Service: Cardiovascular;  Laterality: Bilateral;   macular photocoagulation     (eye treatments for diabetic retinopathy)-Dr. Zigmund Daniel   PERIPHERAL VASCULAR INTERVENTION  02/28/2019   Procedure: PERIPHERAL VASCULAR INTERVENTION;  Surgeon: Adrian Prows, MD;  Location: [place] CV LAB;  Service: Cardiovascular;;   TEE WITHOUT CARDIOVERSION N/A 03/18/2022   Procedure: TRANSESOPHAGEAL ECHOCARDIOGRAM (TEE);  Surgeon: Adrian Prows, MD;  Location: [facility] ENDOSCOPY;  Service: Cardiovascular;  Laterality: N/A;   VENTRICULAR ASSIST DEVICE INSERTION N/A 12/31/2020   Procedure: VENTRICULAR ASSIST DEVICE INSERTION;  Surgeon: Adrian Prows, MD;  Location: [place] CV LAB;  Service: Cardiovascular;  Laterality: N/A;   Patient Active Problem List   Diagnosis Date Noted   Chronic right-sided congestive heart failure ([place]) 05/18/2022   Ejection fraction < 50% 04/17/2022   Insomnia    Constipation    S/P BKA (below knee amputation), right ([place]) 03/21/2022   Ischemic cardiomyopathy    AKI (acute kidney injury) ([place]) 03/13/2022   Streptococcal bacteremia 03/13/2022   Hypophosphatemia 03/13/2022   Septic shock ([place]) 28/78/6767   Acute systolic heart failure ([place]) 12/28/2020   Acute pulmonary edema ([place]) 10/11/2020   Non-ST elevation (NSTEMI) myocardial infarction ([place]) 10/11/2020   Acute respiratory distress 10/11/2020   Acute on chronic combined systolic and diastolic CHF (congestive heart failure) ([place]) 10/11/2020   Cardiogenic shock (HCC)    Partial nontraumatic amputation of foot, right ([place]) 06/21/2019   Osteomyelitis of right foot ([place])    Essential hypertension, benign 02/06/2019   Hypercholesteremia 02/06/2019   Stage 3b chronic kidney disease (CKD) ([place]) 10/06/2017   Proteinuria 10/06/2017   Controlled type 2 diabetes mellitus with hyperglycemia, with long-term current use of insulin ([place]) 10/06/2017   Coronary artery disease involving native coronary artery of native heart without angina pectoris  05/23/2017   Abnormal nuclear stress test 09/06/2016   PAD (peripheral artery disease) ([place]) 10/28/2012   Morbid obesity with BMI of 40.0-44.9, adult ([place]) 10/13/2012   Diabetes mellitus with ophthalmic complication ([place]) 20/94/7096   Hypertension associated with diabetes ([place]) 10/13/2012   Atherosclerosis of native artery of extremity with intermittent claudication ([place]) 04/05/2012   Adenomatous colon polyp 09/28/2011   Diabetes mellitus ([place]) 09/28/2011   Pure hyperglyceridemia 09/28/2011   Erectile dysfunction 09/28/2011   Diabetic retinopathy ([place]) 09/28/2011    REFERRING DIAG: Z89.511 (ICD-10-CM) - S/P below knee amputation, right  ONSET DATE: 06/25/2022 prosthesis delivery  THERAPY DIAG:  Other abnormalities of gait and mobility  Unsteadiness on feet  Muscle weakness (generalized)  Abnormal posture  Pain in right lower leg  Rationale for Evaluation and Treatment Rehabilitation  PERTINENT HISTORY: combined systolic & diastolic CHF/cardiomyopathy EF 15-20%, DM2, PVD/PAD, CAD, CABG 2017, HTN, HLD, morbid obesity, CKD st3b, diabetic retinopathy  PRECAUTIONS: None  SUBJECTIVE:  He wore prosthesis all awake hours except 1 hour midday.  He weighed  himself with prosthesis in morning to monitor for fluid retention.  He was able to donne long pants as PT instructed.   PAIN:  Are you having pain?   0/10 today  OBJECTIVE: (objective measures completed at initial evaluation unless otherwise dated)  OBJECTIVE:    COGNITION: 06/30/2022  Overall cognitive status: Within functional limits for tasks assessed   POSTURE: 06/30/2022  rounded shoulders, forward head, and weight shift left   LOWER EXTREMITY ROM:   ROM P:passive  A:active Right Eval 06/30/2022 Left eval  Hip flexion      Hip extension      Hip abduction      Hip adduction      Hip internal rotation      Hip external rotation      Knee flexion      Knee extension 0*    Ankle dorsiflexion      Ankle  plantarflexion      Ankle inversion      Ankle eversion       (Blank rows = not tested)   LOWER EXTREMITY MMT:   MMT Right Eval 06/30/2022 Left eval  Hip flexion      Hip extension 4/5    Hip abduction 4/5    Hip adduction      Hip internal rotation      Hip external rotation      Knee flexion 4/5    Knee extension 4/5    Ankle dorsiflexion NA    Ankle plantarflexion NA    Ankle inversion NA    Ankle eversion NA    Evaluation testing functionally (Blank rows = not tested)   TRANSFERS: 06/30/2022  Sit to stand: SBA from chairs with armrests using back of LLE against chair to stabilize 06/30/2022   Stand to sit:  SBA using UEs to control descent   GAIT: 07/20/2022 Pt ambulates with step-through pattern with RW and Rt medial whip  06/30/2022 Gait pattern: step to pattern, decreased step length- Left, decreased stance time- Right, decreased stride length, decreased hip/knee flexion- Right, Right hip hike, antalgic, and abducted- Right Distance walked: 100' Assistive device utilized: Environmental consultant - 2 wheeled and TTA prosthesis Level of assistance: SBA  needs cues for gait deviations / no assist physically with RW support Comments: excessive UE weight bearing on RW to limit weight on prosthesis.    FUNCTIONAL TESTs:   Merrilee Jansky Balance Scale: 06/30/2022:   20/56   [facility] PT Assessment - 06/30/22 1345                Standardized Balance Assessment    Standardized Balance Assessment Berg Balance Test          Berg Balance Test    Sit to Stand Needs minimal aid to stand or to stabilize     Standing Unsupported Able to stand 2 minutes with supervision     Sitting with Back Unsupported but Feet Supported on Floor or Stool Able to sit safely and securely 2 minutes     Stand to Sit Controls descent by using hands     Transfers Able to transfer safely, definite need of hands     Standing Unsupported with Eyes Closed Unable to keep eyes closed 3 seconds but stays steady     Standing  Unsupported with Feet Together Needs help to attain position but able to stand for 30 seconds with feet together     From Standing, Reach Forward with Outstretched Arm Reaches forward but needs supervision  From Standing Position, Pick up Object from Floor Unable to try/needs assist to keep balance     From Standing Position, Turn to Look Behind Over each Shoulder Needs supervision when turning     Turn 360 Degrees Needs assistance while turning     Standing Unsupported, Alternately Place Feet on Step/Stool Needs assistance to keep from falling or unable to try     Standing Unsupported, One Foot in [place] help to step but can hold 15 seconds     Standing on One Leg Tries to lift leg/unable to hold 3 seconds but remains standing independently     Total Score 20     Berg comment: BERG  < 36 high risk for falls (close to 100%) 46-51 moderate (>50%)   37-45 significant (>80%) 52-55 lower (> 25%)                   CURRENT PROSTHETIC WEAR ASSESSMENT: 06/30/2022 Patient is dependent with: skin check, residual limb care, prosthetic cleaning, ply sock cleaning, correct ply sock adjustment, proper wear schedule/adjustment, and proper weight-bearing schedule/adjustment Donning prosthesis: SBA Doffing prosthesis: SBA Prosthetic wear tolerance: 2 hours, 2x/day, 5 days since delivery Prosthetic weight bearing tolerance: 5 minutes with residual limb pain 3-4/10 Edema: pitting Residual limb condition:  scab medially 47m and dry scab lateral scar 345m  Good hair growth, normal color & temperature.  Cylindrical shape Prosthetic description: silicon liner with pin lock suspension, total contact socket, dynamic response foot K code/activity level with prosthetic use: Level 3     TODAY'S TREATMENT:  08/19/2022 Prosthetic Training with TTA prosthesis Wound healed.  Continue with wear all awake hours except 1 hr midday and dry limb/liner mid of ea wear.  Stairs: descend with 2 rails alternating &  with right rail / LUE cane step-to using RLE to lower.  Ascend with 2 rails & left rail / RUE cane alternating.  PT verbal & demo cues with prosthesis.  Pt amb 450' without device with cues on step width, working on scanning. Lifting & carrying 18# box with verbal cues;  moving box between surfaces with 90* turn & placing on hip height & knee height surface with verbal cues for technique & foot position. Pushing & Pulling with weight shift between feet - 30# weight in front with min guard and 20# weight behind with intermittent minA for balance.    08/17/2022 Prosthetic Training with TTA prosthesis Wound 68m22m 68mm40mth no Tegaderm covering. No signs of infection.  Increase wear from arising to bedtime with 1 hr off mid-day, dry limb/liner half way.  Pt verbalized understanding. PT verbal cues on donning long pants with prosthesis, changing shoes, using wader or water proof vac bag, weighing in morning as MD recommended with prosthesis & fall risk when prosthesis off.  Pt verbalized understanding.  Pt amb 200' X 2 with cane with supervision working on scanning maintaining path/pace, walking eyes closed 3 steps to work on walking in dark & increasing speed to "cross a street." Pt amb 50' X 2 without device with supervision.  Pt walking backwards with cane with supervision. Pt neg ramp & curb with cane with supervision & verbal cues on technique.   08/12/2022 Prosthetic Training with TTA prosthesis Wound 68mm 64mmm w46m no Tegaderm covering. No signs of infection.  Increase wear from arising to bedtime with 2hrs off mid-day, dry limb/liner half way.  Pt verbalized understanding. Pt amb 200' X 3 with cane with supervision working on scanning  maintaining path/pace and divided attention with cognitive task with scanning.  Pt amb 50' X 2 without device with supervision.  Initial steps with significant decreased stance duration on prosthesis but improved with tactile cues for weight shift & repetition.   PT demo & verbal cues on walking backwards with cane with supervision. Pt neg ramp & curb with cane with minA & verbal cues on technique.  PT recommended short / household walk without device, medium / basic community with cane (how to have girlfriend give HHA if notes fatigue) and long walk / max tolerable distance with rollator walker. Pt verbalized understanding.   Therapeutic Activity: PT demo carrying items with TTA prosthesis.     HOME EXERCISE PROGRAM:  Access Code: [email or web address] URL: [email or web address] Date: 07/20/2022 Prepared by: Jamey Reas  Exercises - Seated Quad Set  - 3-5 x daily - 7 x weekly - 2-3 sets - 10-20 reps - 5 seconds hold - Straight Leg Raise with External Rotation  - 3-5 x daily - 7 x weekly - 2-3 sets - 10-20 reps - 5 seconds hold   Do each exercise 1-2  times per day Do each exercise 5-10 repetitions Hold each exercise for 2 seconds to feel your location  AT [place].  Try to find this position when standing still for activities.   USE TAPE ON FLOOR TO MARK THE MIDLINE POSITION which is even with middle of sink.  You also should try to feel with your limb pressure in socket.  You are trying to feel with limb what you used to feel with the bottom of your foot.  Side to Side Shift: Moving your hips only (not shoulders): move weight onto your left leg, HOLD/FEEL pressure in socket.  Move back to equal weight on each leg, HOLD/FEEL pressure in socket. Move weight onto your right leg, HOLD/FEEL pressure in socket. Move back to equal weight on each leg, HOLD/FEEL pressure in socket. Repeat.  Start with both hands on sink, progress to hand on prosthetic side only, then no hands.  Front to Back Shift: Moving your hips only (not shoulders): move your weight forward onto your toes, HOLD/FEEL pressure in socket. Move your weight back to equal Flat Foot on both legs, HOLD/FEEL   pressure in socket. Move your weight back onto your heels, HOLD/FEEL  pressure in socket. Move your weight back to equal on both legs, HOLD/FEEL  pressure in socket. Repeat.  Start with both hands on sink, progress to hand on prosthetic side only, then no hands.  Moving Cones / Cups: With equal weight on each leg: Hold on with one hand the first time, then progress to no hand supports. Move cups from one side of sink to the other. Place cups ~2" out of your reach, progress to 10" beyond reach.  Place one hand in middle of sink and reach with other hand. Do both arms.  Then hover one hand and move cups with other hand.  Overhead/Upward Reaching: alternated reaching up to top cabinets or ceiling if no cabinets present. Keep equal weight on each leg. Start with one hand support on counter while other hand reaches and progress to no hand support with reaching.  ace one hand in middle of sink and reach with other hand. Do both arms.  Then hover one hand and move cups with other hand.  5.   Looking Over Shoulders: With equal weight on each leg: alternate turning  to look over your shoulders with one hand support on counter as needed.  Start with head motions only to look in front of shoulder, then even with shoulder and progress to looking behind you. To look to side, move head /eyes, then shoulder on side looking pulls back, shift more weight to side looking and pull hip back. Place one hand in middle of sink and let go with other hand so your shoulder can pull back. Switch hands to look other way.   Then hover one hand and look over shoulder. If looking right, use left hand at sink. If looking left, use right hand at sink. 6.  Stepping with leg that is not amputated:  Move items under cabinet out of your way. Shift your hips/pelvis so weight on prosthesis. Tighten muscles in hip on prosthetic side.  SLOWLY step other leg so front of foot is in cabinet. Then step back to floor.       ASSESSMENT: CLINICAL  IMPRESSION: Patient has improved endurance noted with functional activities.  His balance is improving also.  PT instructed in lifting, carrying, pushing & pulling which he improved with cues & practice.     OBJECTIVE IMPAIRMENTS Abnormal gait, cardiopulmonary status limiting activity, decreased activity tolerance, decreased balance, decreased endurance, decreased knowledge of condition, decreased knowledge of use of DME, decreased mobility, difficulty walking, decreased strength, increased edema, postural dysfunction, prosthetic dependency , obesity, pain, and skin integrity issues on residual limb .    ACTIVITY LIMITATIONS carrying, lifting, bending, sitting, standing, squatting, stairs, transfers, locomotion level, and safe prosthesis use   PARTICIPATION LIMITATIONS: driving, community activity, and recreational activities   [place], Time since onset of injury/illness/exacerbation, and 3+ comorbidities: see PMH  are also affecting patient's functional outcome.    REHAB POTENTIAL: Good   CLINICAL DECISION MAKING: Evolving/moderate complexity   EVALUATION COMPLEXITY: Moderate     GOALS: Goals reviewed with patient? Yes   SHORT TERM GOALS: Target date: 08/27/2022   Patient verbalizes adjusting ply socks & sweat management.  Baseline: SEE OBJECTIVE DATA Goal status: ongoing 08/03/2022 2.  Patient tolerates prosthesis >12 hrs total /day with no skin issues or limb pain after standing. Baseline: SEE OBJECTIVE DATA Goal status: ongoing 08/03/2022   3.  Berg Balance Test >/= 36/56 Baseline: SEE OBJECTIVE DATA Goal status: ongoing 08/03/2022   4. Patient ambulates 200' with cane & prosthesis with supervision. Baseline: SEE OBJECTIVE DATA Goal status: ongoing 08/03/2022   5. Patient negotiates ramps & curbs with cane & prosthesis with minA. Baseline: SEE OBJECTIVE DATA Goal status:  ongoing 08/03/2022     LONG TERM GOALS: Target date: 09/24/2022   Patient  demonstrates & verbalized understanding of prosthetic care to enable safe utilization of prosthesis. Baseline: SEE OBJECTIVE DATA Goal status: INITIAL   Patient tolerates prosthesis wear >90% of awake hours without skin or limb pain issues. Baseline: SEE OBJECTIVE DATA Goal status: INITIAL   Berg Balance >/= 40/56  to indicate lower fall risk Baseline: SEE OBJECTIVE DATA Goal status: INITIAL   Patient ambulates >500' with prosthesis & cane or less independently Baseline: SEE OBJECTIVE DATA Goal status: INITIAL   Patient negotiates ramps, curbs & stairs with single rail with prosthesis cane or less independently. Baseline: SEE OBJECTIVE DATA Goal status: INITIAL   Patient demonstrates & verbalizes climbing ladder, lifting, carrying, pushing, pulling with prosthesis safely. Baseline: SEE OBJECTIVE DATA Goal status: INITIAL   Patient verbalizes & demonstrates understanding of fishing simulations.  Baseline: SEE OBJECTIVE DATA Goal  status: INITIAL   PLAN: PT FREQUENCY: 2x/week   PT DURATION: 90 days / 13 weeks   PLANNED INTERVENTIONS: Therapeutic exercises, Therapeutic activity, Neuromuscular re-education, Balance training, Gait training, Patient/Family education, Stair training, Vestibular training, Prosthetic training, DME instructions, and Physical Performance Testing   PLAN FOR NEXT SESSION:  check STGs next week.  Advance balance & gait activities. Continue with functional activities.  Progress wear to all awake hours with scheduled drying   Jamey Reas, PT, DPT 08/19/2022, 12:38 PM

## 2022-08-19 NOTE — Progress Notes (Signed)
Primary Physician/Referring:  Rita Ohara, MD  Patient ID: Eduardo [name], male    DOB: 1956/10/11, 66 y.o.   MRN: [id number]  No chief complaint on file.  HPI:    Eduardo [name]  is a 66 y.o. Caucasian male  with controlled type 2 diabetes, hypertension, hyperlipidemia, coronary artery disease by angiography on 09/08/2016 revealing severe triple-vessel CAD with no targets for CABG, he presented with new onset left bundle branch block and acute fulminant pulmonary edema needing intubation on 10/11/2020, emergent cardiac catheterization essentially revealing progression of severe native vessel disease with no significant option for revascularization.  Although LAD was felt to be amenable for potential revascularization, it was unchanged from prior cardiac catheterization and RCA was felt to be the culprit, in view of diffuse disease medical management was recommended.  Past medical history is also significant for morbid obesity, diabetes mellitus with stage III AV chronic kidney disease, mild carotid atherosclerosis, right SFA occlusion S/P complex PV angiogram on 02/28/2019 with PTA to occluded right SFA with implantation of 3 overlapping 6.0 x 120 x2; 6.0 x 100 mm Eluvia DES for critical right leg ischemia now resolved. 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 He has mild disease in the left leg.  PAD has remained stable. Due to frequent PVCs, he was also started on amiodarone  prophylactically.   Patient was hospitalized 12/28/2020-01/02/2021 for acute on chronic systolic heart failure.  Patient was diuresed well, and given ischemic cardiomyopathy as etiology of heart failure attempted high risk PCI to LAD CTO, however it was unsuccessful. Conservative medical management was recommended, with consideration of palliative care involvement.  Patient was previously on spironolactone, however due to renal insufficiency this has been discontinued.  Patient was hospitalized 03/08/2022 -  03/21/2022 with septic shock secondary to strep bacteremia from diabetic foot infection.  During hospitalization patient underwent right BKA by Dr. Sharol Given on 03/12/2022.    Patient is here for a follow-up visit. He is doing well with physical therapy. Denies CP, SOB, palpitations, diaphoresis, syncope, edema, orthopnea. He has not been referred for ICD or CRT even though his EF is reduced and he has LBBB on EKG. Patient agreeable to seeing EP to discuss CRT-D or ICD placement.  Past Medical History:  Diagnosis Date   CHF (congestive heart failure) (HCC)    Chronic kidney disease    stage 3   Colon polyp    Coronary artery disease    Diabetes mellitus 1987   under care of Dr. Chalmers Cater.  On insulin since 96 (off and on)   Diabetic retinopathy    Dupuytren contracture    R hand, s/p injection (Dr. Lenon Curt)   Essential hypertension, benign    Essential hypertension, benign 02/06/2019   Frequency of urination and polyuria    Hypertension    Myocardial infarction [facility])    denies   Neuromuscular disorder ([place])    Diabetic neuropathy   Osteomyelitis ([place])    right foot   Other testicular hypofunction    Peripheral arterial disease ([place]) 10/28/2012   Peritoneal abscess ([place]) 6/08   and buttock.   Pneumonia    Polydipsia    Proteinuria    Pure hyperglyceridemia    Subacute osteomyelitis, right ankle and foot ([place])    Wears glasses    Past Surgical History:  Procedure Laterality Date   ABDOMINAL AORTAGRAM N/A 04/18/2012   Procedure: ABDOMINAL Maxcine Ham;  Surgeon: Angelia Mould, MD;  Location: [facility] CATH LAB;  Service: Cardiovascular;  Laterality: N/A;   AMPUTATION Right 05/19/2019   Procedure: RIGHT FOOT 5TH RAY AMPUTATION;  Surgeon: Newt Minion, MD;  Location: [place];  Service: Orthopedics;  Laterality: Right;   AMPUTATION Right 03/11/2022   Procedure: RIGHT LEG DEBRIDEMENT VS. BELOW KNEE AMPUTATION;  Surgeon: Newt Minion, MD;  Location: [place];  Service: Orthopedics;  Laterality: Right;    CARDIAC CATHETERIZATION N/A 09/08/2016   Procedure: Left Heart Cath and Coronary Angiography;  Surgeon: Adrian Prows, MD;  Location: [place] CV LAB;  Service: Cardiovascular;  Laterality: N/A;   CATARACT EXTRACTION, BILATERAL  09/2017, 10/2017   Dr. Herbert Deaner   COLONOSCOPY W/ BIOPSIES AND POLYPECTOMY     CORONARY/GRAFT ACUTE MI REVASCULARIZATION N/A 10/11/2020   Procedure: Coronary/Graft Acute MI Revascularization;  Surgeon: Adrian Prows, MD;  Location: [place] CV LAB;  Service: Cardiovascular;  Laterality: N/A;   I & D EXTREMITY Right 03/11/2022   Procedure: BELOW KNEE AMPUTATION;  Surgeon: Newt Minion, MD;  Location: [place];  Service: Orthopedics;  Laterality: Right;   LEFT HEART CATH N/A 12/31/2020   Procedure: Left Heart Cath;  Surgeon: Adrian Prows, MD;  Location: [place] CV LAB;  Service: Cardiovascular;  Laterality: N/A;   LEFT HEART CATH AND CORONARY ANGIOGRAPHY N/A 10/11/2020   Procedure: LEFT HEART CATH AND CORONARY ANGIOGRAPHY;  Surgeon: Adrian Prows, MD;  Location: [place] CV LAB;  Service: Cardiovascular;  Laterality: N/A;   LOWER EXTREMITY ANGIOGRAM Bilateral 04/18/2012   Procedure: LOWER EXTREMITY ANGIOGRAM;  Surgeon: Angelia Mould, MD;  Location: [facility] CATH LAB;  Service: Cardiovascular;  Laterality: Bilateral;  bilat lower extrem angio   LOWER EXTREMITY ANGIOGRAPHY Bilateral 05/02/2019   Procedure: LOWER EXTREMITY ANGIOGRAPHY;  Surgeon: Adrian Prows, MD;  Location: [place] CV LAB;  Service: Cardiovascular;  Laterality: Bilateral;   LOWER EXTREMITY ANGIOGRAPHY Bilateral 02/28/2019   Procedure: LOWER EXTREMITY ANGIOGRAPHY;  Surgeon: Adrian Prows, MD;  Location: [place] CV LAB;  Service: Cardiovascular;  Laterality: Bilateral;   macular photocoagulation     (eye treatments for diabetic retinopathy)-Dr. Zigmund Daniel   PERIPHERAL VASCULAR INTERVENTION  02/28/2019   Procedure: PERIPHERAL VASCULAR INTERVENTION;  Surgeon: Adrian Prows, MD;  Location: [place] CV LAB;  Service:  Cardiovascular;;   TEE WITHOUT CARDIOVERSION N/A 03/18/2022   Procedure: TRANSESOPHAGEAL ECHOCARDIOGRAM (TEE);  Surgeon: Adrian Prows, MD;  Location: [facility] ENDOSCOPY;  Service: Cardiovascular;  Laterality: N/A;   VENTRICULAR ASSIST DEVICE INSERTION N/A 12/31/2020   Procedure: VENTRICULAR ASSIST DEVICE INSERTION;  Surgeon: Adrian Prows, MD;  Location: [place] CV LAB;  Service: Cardiovascular;  Laterality: N/A;   Family History  Problem Relation Age of Onset   Diabetes Mother    Hearing loss Mother    Hypertension Mother    Hyperlipidemia Mother    Heart disease Mother    Varicose Veins Mother    Varicose Veins Father    Dementia Father    Hyperlipidemia Brother    Diabetes Maternal Grandmother    Social History   Tobacco Use   Smoking status: Former    Packs/day: 1.00    Years: 30.00    Total pack years: 30.00    Types: Cigarettes    Quit date: 01/08/2012    Years since quitting: 10.6   Smokeless tobacco: Never  Substance Use Topics   Alcohol use: Not Currently    Comment: rare   Marital Status: Widowed ROS  Review of Systems  Cardiovascular:  Negative for chest pain, claudication, leg swelling, near-syncope, orthopnea, palpitations,  paroxysmal nocturnal dyspnea and syncope.  Respiratory:  Negative for shortness of breath.   Hematologic/Lymphatic: Bruises/bleeds easily.  Neurological:  Negative for dizziness.   Objective  Blood pressure 102/61, pulse 81, temperature (!) 97.1 F (36.2 C), temperature source Temporal, resp. rate 16, height 5' 10" (1.778 m), weight 241 lb (109.3 kg), SpO2 95 %.     08/19/2022    2:04 PM 07/06/2022    9:35 AM 05/18/2022   11:22 AM  Vitals with BMI  Height 5' 10" 5' 10" 5' 10"  Weight 241 lbs 252 lbs 6 oz   BMI 07.62 26.33   Systolic 354 562 563  Diastolic 61 60 70  Pulse 81 72 76     Physical Exam Vitals and nursing note reviewed.  Neck:     Thyroid: No thyromegaly.  Cardiovascular:     Rate and Rhythm: Normal rate and regular rhythm.      Heart sounds: No murmur heard.    No gallop.  Pulmonary:     Effort: Pulmonary effort is normal.     Breath sounds: No wheezing, rhonchi or rales.  Musculoskeletal:        General: No tenderness.     Left lower leg: No edema.     Comments: Right BKA  Skin:    General: Skin is warm and dry.  Neurological:     Mental Status: He is alert.   Laboratory examination:   Recent Labs    03/21/22 0427 03/23/22 0813 03/30/22 0035 04/08/22 1326 04/17/22 1135  NA 135 136 138 142 137  K 3.9 4.1 3.9 5.6* 5.2  CL 102 106 108 107* 104  CO2 _0 GLUCOSE 181* 118* 79 165* 205*  BUN 31* 31* 25* 24 42*  CREATININE 1.42* 1.36* 1.34* 1.62* 1.40*  CALCIUM 8.2* 8.4* 8.3* 9.2 9.0  GFRNONAA 54* 57* 58*  --   --    CrCl cannot be calculated (Patient's most recent lab result is older than the maximum 21 days allowed.).     Latest Ref Rng & Units 04/17/2022   11:35 AM 04/08/2022    1:26 PM 03/30/2022   12:35 AM  CMP  Glucose 70 - 99 mg/dL 205  165  79   BUN 8 - 27 mg/dL 42  24  25   Creatinine 0.76 - 1.27 mg/dL 1.40  1.62  1.34   Sodium 134 - 144 mmol/L 137  142  138   Potassium 3.5 - 5.2 mmol/L 5.2  5.6  3.9   Chloride 96 - 106 mmol/L 104  107  108   CO2 20 - 29 mmol/L _1 Calcium 8.6 - 10.2 mg/dL 9.0  9.2  8.3   Total Protein 6.0 - 8.5 g/dL  6.3    Total Bilirubin 0.0 - 1.2 mg/dL  0.6    Alkaline Phos 44 - 121 IU/L  68    AST 0 - 40 IU/L  21    ALT 0 - 44 IU/L  17        Latest Ref Rng & Units 04/17/2022   11:35 AM 04/08/2022    1:26 PM 03/30/2022   12:35 AM  CBC  WBC 3.4 - 10.8 x10E3/uL 6.3  5.7  5.5   Hemoglobin 13.0 - 17.7 g/dL 11.0  11.8  11.8   Hematocrit 37.5 - 51.0 % 33.4  35.6  35.3   Platelets 150 - 450 x10E3/uL 236  196  207  Lipid Panel     Component Value Date/Time   CHOL 96 (L) 08/18/2021 1538   TRIG 138 08/18/2021 1538   HDL 30 (L) 08/18/2021 1538   CHOLHDL 3.9 06/26/2021 0949   CHOLHDL 4.4 09/28/2011 1358   VLDL 21 09/28/2011 1358    LDLCALC 42 08/18/2021 1538    HEMOGLOBIN A1C Lab Results  Component Value Date   HGBA1C 6.6 06/23/2022   MPG 162.81 03/11/2022   TSH No results for input(s): "TSH" in the last 8760 hours.  External Labs:  Glucose Random 453.000 M 09/08/2019 BUN 38.000 M 09/08/2019 Creatinine, Serum 1.340 MG/ 09/08/2019  PSA 3.300 06/12/2019 01/19/2018: Cholesterol 115, triglycerides 112, HDL 39, LDL 54.  Glucose 125, creatinine 1.16, potassium 5.2, EGFR 67, CMP otherwise normal.  CBC normal.  TSH 1.5.  Allergies   Allergies  Allergen Reactions   Shellfish-Derived Products Nausea And Vomiting and Other (See Comments)    Only Mussels cause severe nausea and vomiting    Latex Rash   Tape Rash and Other (See Comments)    Caused issues with the skin   Testosterone Rash    Other reaction(s): did not feel well Other reaction(s): did not feel well    Medications Prior to Visit:   Outpatient Medications Prior to Visit  Medication Sig Dispense Refill   acetaminophen (TYLENOL) 325 MG tablet Take 1-2 tablets (325-650 mg total) by mouth every 4 (four) hours as needed for mild pain.     aspirin 81 MG chewable tablet Chew 1 tablet (81 mg total) by mouth daily.     Bioflavonoid Products (ESTER C PO) Take 1,000 mg by mouth daily.     Cholecalciferol (VITAMIN D) 2000 units CAPS Take 2,000 Units by mouth daily.     clopidogrel (PLAVIX) 75 MG tablet TAKE 1 TABLET BY MOUTH DAILY 90 tablet 1   ENTRESTO 24-26 MG TAKE 1 TABLET BY MOUTH TWICE  DAILY 180 tablet 1   ezetimibe (ZETIA) 10 MG tablet Take 1 tablet (10 mg total) by mouth daily. 90 tablet 3   gemfibrozil (LOPID) 600 MG tablet Take 600 mg by mouth 2 (two) times daily before a meal.     HUMALOG KWIKPEN 100 UNIT/ML KiwkPen Inject 0-10 Units into the skin See admin instructions. Inject 0-10 units into the skin three times a day, per sliding scale- based on BGL >100  1   HUMULIN N KWIKPEN 100 UNIT/ML Kiwkpen Inject 20 Units into the skin See admin  instructions. Inject 35 units into the skin before breakfast  1   Krill Oil 300 MG CAPS      melatonin 10 MG TABS Take 10 mg by mouth at bedtime.  0   Multiple Vitamin (MULTIVITAMIN WITH MINERALS) TABS tablet Take 1 tablet by mouth daily.     nitroGLYCERIN (NITROSTAT) 0.4 MG SL tablet Place 1 tablet (0.4 mg total) under the tongue every 5 (five) minutes as needed for chest pain. 15 tablet 3   Probiotic Product (PROBIOTIC-10 PO) Take 1 capsule by mouth daily.     rosuvastatin (CRESTOR) 20 MG tablet Take 1 tablet (20 mg total) by mouth daily. 90 tablet 3   tirzepatide (MOUNJARO) 7.5 MG/0.5ML Pen See admin instructions.     torsemide (DEMADEX) 20 MG tablet Take 20 mg by mouth 2 (two) times daily.     No facility-administered medications prior to visit.   Final Medications at End of Visit    Current Meds  Medication Sig   acetaminophen (TYLENOL) 325 MG tablet  Take 1-2 tablets (325-650 mg total) by mouth every 4 (four) hours as needed for mild pain.   aspirin 81 MG chewable tablet Chew 1 tablet (81 mg total) by mouth daily.   Bioflavonoid Products (ESTER C PO) Take 1,000 mg by mouth daily.   Cholecalciferol (VITAMIN D) 2000 units CAPS Take 2,000 Units by mouth daily.   clopidogrel (PLAVIX) 75 MG tablet TAKE 1 TABLET BY MOUTH DAILY   ENTRESTO 24-26 MG TAKE 1 TABLET BY MOUTH TWICE  DAILY   ezetimibe (ZETIA) 10 MG tablet Take 1 tablet (10 mg total) by mouth daily.   gemfibrozil (LOPID) 600 MG tablet Take 600 mg by mouth 2 (two) times daily before a meal.   HUMALOG KWIKPEN 100 UNIT/ML KiwkPen Inject 0-10 Units into the skin See admin instructions. Inject 0-10 units into the skin three times a day, per sliding scale- based on BGL >100   HUMULIN N KWIKPEN 100 UNIT/ML Kiwkpen Inject 20 Units into the skin See admin instructions. Inject 35 units into the skin before breakfast   Krill Oil 300 MG CAPS    melatonin 10 MG TABS Take 10 mg by mouth at bedtime.   Multiple Vitamin (MULTIVITAMIN WITH MINERALS)  TABS tablet Take 1 tablet by mouth daily.   nitroGLYCERIN (NITROSTAT) 0.4 MG SL tablet Place 1 tablet (0.4 mg total) under the tongue every 5 (five) minutes as needed for chest pain.   Probiotic Product (PROBIOTIC-10 PO) Take 1 capsule by mouth daily.   rosuvastatin (CRESTOR) 20 MG tablet Take 1 tablet (20 mg total) by mouth daily.   tirzepatide [facility]) 7.5 MG/0.5ML Pen See admin instructions.   torsemide (DEMADEX) 20 MG tablet Take 20 mg by mouth 2 (two) times daily.   Radiology:   DG CHEST PORT 1 VIEW 10/12/2020: COMPARISON:  10/11/2020 FINDINGS: Vascular congestion with bilateral lower lung zone airspace opacities are without change. Upper lungs are clear. Suspect small effusions.  No pneumothorax. Endotracheal tube, right internal jugular central venous line and nasal/orogastric tube are stable. IMPRESSION: 1. No change from the previous day's exam. 2. Persistent vascular congestion and lower lung zone airspace opacities, the latter finding likely due to a combination of pleural fluid with atelectasis, with a possible component of either edema or infection.   Cardiac Studies:     Exercise sestamibi stress test 08/17/2016: 1. Resting EKG demonstrates normal sinus rhythm, left axis deviation, left plantar fascicular block.  Poor R-wave progression, ulnar disease pattern.  Nonspecific ST-T abnormality, cannot exclude lateral ischemia.  Stress EKG is equivocal for ischemia, patient developing atypical left otherwise for with exercise which reverted back to baseline immediately on combination of the stress test less than 60 seconds.  There was no additional ST-T wave changes of ischemia. Patient exercised on Bruce protocol for 4:25 minutes and achieved 5.45 METS. Stress test terminated due to 89 % MPHR achieved (Target HR >85%). Symptoms included dizziness.  2.  2-Day protocol followed. Perfusion imaging studies demonstrate large sized severe perfusion defect involving the inferior, anterior and  anterolateral consistent with inferior wall scar with moderate peri-infarct ischemia and severe anterior and anteroapical reversible ischemia extending from the base towards the apex.  Left ventricular systolic function calculating by QGS was markedly depressed at 26%.  This is a high risk study, consider further cardiac work-up.   Carotid artery duplex 02/16/2018: No hemodynamically significant arterial disease in the internal carotid artery bilaterally. Mild heterogenous plaque noted.  Antegrade right vertebral artery flow. Antegrade left vertebral artery flow. Compared to  the study done on 09/01/2016, left carotid noted as occlusion is an error. This may perhaps be due to low velocity noted in both carotid arteries and may indicate low systemic BP or reduced cardiac output. Clinical correlation recommended.  Peripheral arteriogram 05/02/2019: Right common femoral artery and right proximal SFA previously placed stent widely patent.  Right iliac artery shows mild disease. Left iliac artery and left femoral arteries show very mild disease.  There is two-vessel runoff in the left leg, left AT is occluded. Intermediate stenosis noted in the left distal and proximal SFA, pressure pullback reveals no significant gradient.   ABI 08/09/2019: This exam reveals normal perfusion of the right lower extremity (ABI). This exam reveals normal perfusion of the left lower extremity (ABI).  The ABI may be falsely elevated due to medial calcinosis from diabetes. No significant change since 05/11/2019. Patient has h/o right SFA stenting.   Left Heart Catheterization 10/11/20:  LV: Severely dilated.  Global hypokinesis.  Hand contrast injection hence not fully adequately visualized.  LVEF 15 to 20%.  EDP markedly elevated at 29 mmHg.  No pressure gradient across the aortic valve. Left main: Normal. LAD: Severely diffusely diseased.  Gives origin to large D1 which gives collaterals to the LAD and 2 smaller diagonals,  LAD is occluded after the origin of D1, anatomy compared to prior in 2017 reveals progression of diffuse disease.  There are ipsilateral and contralateral collaterals to the LAD. CX: Moderate sized vessel, giving origin to large OM1.  OM1 is occluded in the ostium as was noted previously and has ipsilateral collaterals.  OM1 is diffusely diseased and faintly filled. RCA: Moderate disease in the proximal and mid segment.  At the bifurcation of PDA and PL, there is a high-grade 90% stenosis.  The bifurcation is also involved with at least a 90% stenosis in the PDA and a 60 to 70% stenosis in the PL branch.  There is no target as distally as the PL branch which is large is occluded distally and that was new from 2017.  There are faint collaterals noted from the left to the RCA.   Impression: Severe native vessel three-vessel coronary artery disease with no significant targets for revascularization, LAD may be amenable for revascularization but I am not sure that this would help, anatomy similar to 2017 but progression to diffuse disease.  Patient is extremely ill with high risk for mortality.  He also has underlying stage III-IV kidney disease and contrast nephropathy needs to be evaluated further in view of contrast load of 70 mL.  Coronary Angioplasty 12/31/20 Impella support high risk intervention  Unsuccessful attempt at proximal LAD CTO intervention in spite of use of support catheters and multiple wires. 129 ml contrast used. No immediate complications.   Continue guideline directed medical therapy for heart failure discuss regarding any future attempts of revascularization.  Lower Extremity Arterial Duplex 08/14/2021:  There is monophasic waveform pattern throughout the right lower extremity  arterial system suggesting significant proximal (Iliac artery) stenosis.  Right SFA stents appear to be widely patent.  No hemodynamically significant stenosis are identified in the left lower  extremity  arterial system.  This exam reveals normal perfusion of the right lower extremity (ABI 1.00)  and mildly decreased perfusion of the left lower extremity, noted at the  post tibial artery level (ABI 0.84).  There is monophasic waveform pattern  in bilateral lower extremity at the level of the ankles. Medial calcinosis  noted in bilateral small vessels.  ABI  can be falsely elevated in patients with medial calcinosis. Compared  to 04/07/2019, no change in right ABI, left ABI was 1.00.  Echocardiogram 03/18/2022:   1. Left ventricular ejection fraction, by estimation, is 20 to 25%. The  left ventricle has severely decreased function. The left ventricle  demonstrates global hypokinesis. The left ventricular internal cavity size  was severely dilated. Left ventricular  diastolic function could not be evaluated.   2. Right ventricular systolic function is normal. The right ventricular  size is normal.   3. Left atrial size was severely dilated. No left atrial/left atrial  appendage thrombus was detected.   4. The mitral valve is normal in structure. Mild to moderate mitral valve  regurgitation. No evidence of mitral stenosis.   5. Mild calcification of the non coronary cusp. The aortic valve is  tricuspid. There is mild calcification of the aortic valve. Aortic valve  regurgitation is trivial.  Conclusion(s)/Recommendation(s): No evidence of vegetation/infective  endocarditis on this transesophageael echocardiogram.   EKG   08/19/2022 NSR, Left axis, LBBB.  Inferior infarct old.  Anterior septal infarct old.  Diffuse nonspecific T wave abnormality. Unchanged  02/17/2022: Sinus rhythm at a rate of 72 bpm.  Left axis.  Inferior infarct old.  Anterior septal infarct old.  Diffuse nonspecific T wave abnormality.  08/11/2020: Sinus tachycardia at rate of 120 bpm, atypical left bundle branch block.  No further analysis.   09/13/2020: Normal sinus rhythm at rate of 91 bpm, left atrial enlargement,  inferior infarct old.  Anteroseptal infarct old.  Nonspecific T abnormality.  Compared to 08/11/2020, left bundle branch block not present.  04/19/2020: Normal sinus rhythm at rate of 61 bpm, inferior infarct old.  Anteroseptal infarct old.  Nonspecific T abnormality.  Borderline low voltage complexes.  No significant change from 11/08/2019.   Assessment     ICD-10-CM   1. Chronic systolic heart failure (HCC)  I50.22 EKG 12-Lead    Ambulatory referral to Cardiac Electrophysiology    2. Ischemic cardiomyopathy  I25.5 Ambulatory referral to Cardiac Electrophysiology    3. LBBB (left bundle branch block)  I44.7 Ambulatory referral to Cardiac Electrophysiology     No orders of the defined types were placed in this encounter.  There are no discontinued medications.      Recommendations:    Eduardo [name]  is a 66 y.o. Caucasian male  with controlled type 2 diabetes, hypertension, hyperlipidemia, coronary artery disease by angiography on 09/08/2016 revealing severe triple-vessel CAD with no targets for CABG, ischemic cardiomyopathy, and right BKA.  Chronic systolic heart failure (HCC) Continue entresto, torsemide, crestor Continue Mounjarno   Ischemic cardiomyopathy Refer to EP for CRT-D or ICD placement Continue ASA and plavix Nitro PRN    LBBB (left bundle branch block) Patient is candidate for CRT-D   Follow-up in 6 months, sooner if needed.    Floydene Flock, DO, [facility] 08/19/2022, 2:44 PM Office: [phone]

## 2022-08-24 ENCOUNTER — Encounter: Payer: Medicare Other | Admitting: Physical Therapy

## 2022-08-26 ENCOUNTER — Encounter: Payer: Self-pay | Admitting: Physical Therapy

## 2022-08-26 ENCOUNTER — Ambulatory Visit (INDEPENDENT_AMBULATORY_CARE_PROVIDER_SITE_OTHER): Payer: Medicare Other | Admitting: Physical Therapy

## 2022-08-26 DIAGNOSIS — R2689 Other abnormalities of gait and mobility: Secondary | ICD-10-CM | POA: Diagnosis not present

## 2022-08-26 DIAGNOSIS — R2681 Unsteadiness on feet: Secondary | ICD-10-CM | POA: Diagnosis not present

## 2022-08-26 DIAGNOSIS — R293 Abnormal posture: Secondary | ICD-10-CM | POA: Diagnosis not present

## 2022-08-26 DIAGNOSIS — M79661 Pain in right lower leg: Secondary | ICD-10-CM | POA: Diagnosis not present

## 2022-08-26 DIAGNOSIS — M6281 Muscle weakness (generalized): Secondary | ICD-10-CM

## 2022-08-26 NOTE — Therapy (Signed)
OUTPATIENT PHYSICAL THERAPY TREATMENT NOTE  Patient Name: Eduardo [name] MRN: [id number] DOB:10-Oct-1956, 66 y.o., male Today's Date: 08/26/2022  PCP: Rita Ohara, MD REFERRING PROVIDER: Newt Minion, MD      END OF SESSION:   PT End of Session - 08/26/22 1150     Visit Number 14    Number of Visits 26    Date for PT Re-Evaluation 09/24/22    Authorization Type [employer] Medicare    Authorization Time Period $20 copay    Progress Note Due on Visit 20    PT Start Time 1147    PT Stop Time 1227    PT Time Calculation (min) 40 min    Equipment Utilized During Treatment Gait belt    Activity Tolerance Patient tolerated treatment well;Patient limited by pain    Behavior During Therapy WFL for tasks assessed/performed                     Past Medical History:  Diagnosis Date   CHF (congestive heart failure) ([place])    Chronic kidney disease    stage 3   Colon polyp    Coronary artery disease    Diabetes mellitus 1987   under care of Dr. Chalmers Cater.  On insulin since 96 (off and on)   Diabetic retinopathy    Dupuytren contracture    R hand, s/p injection (Dr. Lenon Curt)   Essential hypertension, benign    Essential hypertension, benign 02/06/2019   Frequency of urination and polyuria    Hypertension    Myocardial infarction [facility])    denies   Neuromuscular disorder ([place])    Diabetic neuropathy   Osteomyelitis ([place])    right foot   Other testicular hypofunction    Peripheral arterial disease ([place]) 10/28/2012   Peritoneal abscess ([place]) 6/08   and buttock.   Pneumonia    Polydipsia    Proteinuria    Pure hyperglyceridemia    Subacute osteomyelitis, right ankle and foot ([place])    Wears glasses    Past Surgical History:  Procedure Laterality Date   ABDOMINAL AORTAGRAM N/A 04/18/2012   Procedure: ABDOMINAL Maxcine Ham;  Surgeon: Angelia Mould, MD;  Location: [facility] CATH LAB;  Service: Cardiovascular;  Laterality: N/A;   AMPUTATION Right 05/19/2019   Procedure: RIGHT FOOT  5TH RAY AMPUTATION;  Surgeon: Newt Minion, MD;  Location: [place];  Service: Orthopedics;  Laterality: Right;   AMPUTATION Right 03/11/2022   Procedure: RIGHT LEG DEBRIDEMENT VS. BELOW KNEE AMPUTATION;  Surgeon: Newt Minion, MD;  Location: [place];  Service: Orthopedics;  Laterality: Right;   CARDIAC CATHETERIZATION N/A 09/08/2016   Procedure: Left Heart Cath and Coronary Angiography;  Surgeon: Adrian Prows, MD;  Location: [place] CV LAB;  Service: Cardiovascular;  Laterality: N/A;   CATARACT EXTRACTION, BILATERAL  09/2017, 10/2017   Dr. Herbert Deaner   COLONOSCOPY W/ BIOPSIES AND POLYPECTOMY     CORONARY/GRAFT ACUTE MI REVASCULARIZATION N/A 10/11/2020   Procedure: Coronary/Graft Acute MI Revascularization;  Surgeon: Adrian Prows, MD;  Location: [place] CV LAB;  Service: Cardiovascular;  Laterality: N/A;   I & D EXTREMITY Right 03/11/2022   Procedure: BELOW KNEE AMPUTATION;  Surgeon: Newt Minion, MD;  Location: [place];  Service: Orthopedics;  Laterality: Right;   LEFT HEART CATH N/A 12/31/2020   Procedure: Left Heart Cath;  Surgeon: Adrian Prows, MD;  Location: [place] CV LAB;  Service: Cardiovascular;  Laterality: N/A;   LEFT HEART CATH AND CORONARY ANGIOGRAPHY N/A  10/11/2020   Procedure: LEFT HEART CATH AND CORONARY ANGIOGRAPHY;  Surgeon: Adrian Prows, MD;  Location: [place] CV LAB;  Service: Cardiovascular;  Laterality: N/A;   LOWER EXTREMITY ANGIOGRAM Bilateral 04/18/2012   Procedure: LOWER EXTREMITY ANGIOGRAM;  Surgeon: Angelia Mould, MD;  Location: [facility] CATH LAB;  Service: Cardiovascular;  Laterality: Bilateral;  bilat lower extrem angio   LOWER EXTREMITY ANGIOGRAPHY Bilateral 05/02/2019   Procedure: LOWER EXTREMITY ANGIOGRAPHY;  Surgeon: Adrian Prows, MD;  Location: [place] CV LAB;  Service: Cardiovascular;  Laterality: Bilateral;   LOWER EXTREMITY ANGIOGRAPHY Bilateral 02/28/2019   Procedure: LOWER EXTREMITY ANGIOGRAPHY;  Surgeon: Adrian Prows, MD;  Location: [place] CV LAB;   Service: Cardiovascular;  Laterality: Bilateral;   macular photocoagulation     (eye treatments for diabetic retinopathy)-Dr. Zigmund Daniel   PERIPHERAL VASCULAR INTERVENTION  02/28/2019   Procedure: PERIPHERAL VASCULAR INTERVENTION;  Surgeon: Adrian Prows, MD;  Location: [place] CV LAB;  Service: Cardiovascular;;   TEE WITHOUT CARDIOVERSION N/A 03/18/2022   Procedure: TRANSESOPHAGEAL ECHOCARDIOGRAM (TEE);  Surgeon: Adrian Prows, MD;  Location: [facility] ENDOSCOPY;  Service: Cardiovascular;  Laterality: N/A;   VENTRICULAR ASSIST DEVICE INSERTION N/A 12/31/2020   Procedure: VENTRICULAR ASSIST DEVICE INSERTION;  Surgeon: Adrian Prows, MD;  Location: [place] CV LAB;  Service: Cardiovascular;  Laterality: N/A;   Patient Active Problem List   Diagnosis Date Noted   LBBB (left bundle branch block) 09/08/5944   Chronic systolic heart failure ([place]) 08/19/2022   Chronic right-sided congestive heart failure ([place]) 05/18/2022   Ejection fraction < 50% 04/17/2022   Insomnia    Constipation    S/P BKA (below knee amputation), right ([place]) 03/21/2022   Ischemic cardiomyopathy    AKI (acute kidney injury) ([place]) 03/13/2022   Streptococcal bacteremia 03/13/2022   Hypophosphatemia 03/13/2022   Septic shock ([place]) 85/92/9244   Acute systolic heart failure ([place]) 12/28/2020   Acute pulmonary edema ([place]) 10/11/2020   Non-ST elevation (NSTEMI) myocardial infarction ([place]) 10/11/2020   Acute respiratory distress 10/11/2020   Acute on chronic combined systolic and diastolic CHF (congestive heart failure) ([place]) 10/11/2020   Cardiogenic shock (HCC)    Partial nontraumatic amputation of foot, right ([place]) 06/21/2019   Osteomyelitis of right foot ([place])    Essential hypertension, benign 02/06/2019   Hypercholesteremia 02/06/2019   Stage 3b chronic kidney disease (CKD) ([place]) 10/06/2017   Proteinuria 10/06/2017   Controlled type 2 diabetes mellitus with hyperglycemia, with long-term current use of insulin ([place]) 10/06/2017    Coronary artery disease involving native coronary artery of native heart without angina pectoris 05/23/2017   Abnormal nuclear stress test 09/06/2016   PAD (peripheral artery disease) ([place]) 10/28/2012   Morbid obesity with BMI of 40.0-44.9, adult ([place]) 10/13/2012   Diabetes mellitus with ophthalmic complication ([place]) 62/86/3817   Hypertension associated with diabetes ([place]) 10/13/2012   Atherosclerosis of native artery of extremity with intermittent claudication ([place]) 04/05/2012   Adenomatous colon polyp 09/28/2011   Diabetes mellitus ([place]) 09/28/2011   Pure hyperglyceridemia 09/28/2011   Erectile dysfunction 09/28/2011   Diabetic retinopathy ([place]) 09/28/2011    REFERRING DIAG: Z89.511 (ICD-10-CM) - S/P below knee amputation, right  ONSET DATE: 06/25/2022 prosthesis delivery  THERAPY DIAG:  Other abnormalities of gait and mobility  Unsteadiness on feet  Muscle weakness (generalized)  Abnormal posture  Pain in right lower leg  Rationale for Evaluation and Treatment Rehabilitation  PERTINENT HISTORY: combined systolic & diastolic CHF/cardiomyopathy EF 15-20%, DM2, PVD/PAD, CAD, CABG 2017, HTN, HLD, morbid obesity, CKD st3b, diabetic retinopathy  PRECAUTIONS: None  SUBJECTIVE:  He saw prosthetist yesterday who added pre-tibial pads & increased distal tibia relief.  He went from 10-ply to 1-ply fit.    PAIN:  Are you having pain?   0/10 today  OBJECTIVE: (objective measures completed at initial evaluation unless otherwise dated)  OBJECTIVE:    COGNITION: 06/30/2022  Overall cognitive status: Within functional limits for tasks assessed   POSTURE: 06/30/2022  rounded shoulders, forward head, and weight shift left   LOWER EXTREMITY ROM:   ROM P:passive  A:active Right Eval 06/30/2022 Left eval  Hip flexion      Hip extension      Hip abduction      Hip adduction      Hip internal rotation      Hip external rotation      Knee flexion      Knee extension 0*    Ankle  dorsiflexion      Ankle plantarflexion      Ankle inversion      Ankle eversion       (Blank rows = not tested)   LOWER EXTREMITY MMT:   MMT Right Eval 06/30/2022 Left eval  Hip flexion      Hip extension 4/5    Hip abduction 4/5    Hip adduction      Hip internal rotation      Hip external rotation      Knee flexion 4/5    Knee extension 4/5    Ankle dorsiflexion NA    Ankle plantarflexion NA    Ankle inversion NA    Ankle eversion NA    Evaluation testing functionally (Blank rows = not tested)   TRANSFERS: 06/30/2022  Sit to stand: SBA from chairs with armrests using back of LLE against chair to stabilize 06/30/2022   Stand to sit:  SBA using UEs to control descent   GAIT: 07/20/2022 Pt ambulates with step-through pattern with RW and Rt medial whip  06/30/2022 Gait pattern: step to pattern, decreased step length- Left, decreased stance time- Right, decreased stride length, decreased hip/knee flexion- Right, Right hip hike, antalgic, and abducted- Right Distance walked: 100' Assistive device utilized: Environmental consultant - 2 wheeled and TTA prosthesis Level of assistance: SBA  needs cues for gait deviations / no assist physically with RW support Comments: excessive UE weight bearing on RW to limit weight on prosthesis.    FUNCTIONAL TESTs:  Merrilee Jansky Balance Scale: 08/26/2022  45/56  [facility] PT Assessment - 08/26/22 1150       Standardized Balance Assessment   Standardized Balance Assessment Berg Balance Test      Berg Balance Test   Sit to Stand Able to stand without using hands and stabilize independently    Standing Unsupported Able to stand safely 2 minutes    Sitting with Back Unsupported but Feet Supported on Floor or Stool Able to sit safely and securely 2 minutes    Stand to Sit Sits safely with minimal use of hands    Transfers Able to transfer safely, minor use of hands    Standing Unsupported with Eyes Closed Able to stand 10 seconds with supervision    Standing Unsupported  with Feet Together Able to place feet together independently and stand for 1 minute with supervision    From Standing, Reach Forward with Outstretched Arm Can reach confidently >25 cm (10")    From Standing Position, Pick up Object from Floor Able to pick up shoe safely and easily  From Standing Position, Turn to Look Behind Over each Shoulder Looks behind from both sides and weight shifts well    Turn 360 Degrees Able to turn 360 degrees safely but slowly    Standing Unsupported, Alternately Place Feet on Step/Stool Able to complete 4 steps without aid or supervision    Standing Unsupported, One Foot in [place] to take small step independently and hold 30 seconds    Standing on One Leg Tries to lift leg/unable to hold 3 seconds but remains standing independently    Total Score 45               Berg Balance Scale: 06/30/2022:   20/56   [facility] PT Assessment - 06/30/22 1345                Standardized Balance Assessment    Standardized Balance Assessment Berg Balance Test          Berg Balance Test    Sit to Stand Needs minimal aid to stand or to stabilize     Standing Unsupported Able to stand 2 minutes with supervision     Sitting with Back Unsupported but Feet Supported on Floor or Stool Able to sit safely and securely 2 minutes     Stand to Sit Controls descent by using hands     Transfers Able to transfer safely, definite need of hands     Standing Unsupported with Eyes Closed Unable to keep eyes closed 3 seconds but stays steady     Standing Unsupported with Feet Together Needs help to attain position but able to stand for 30 seconds with feet together     From Standing, Reach Forward with Outstretched Arm Reaches forward but needs supervision     From Standing Position, Pick up Object from Floor Unable to try/needs assist to keep balance     From Standing Position, Turn to Look Behind Over each Shoulder Needs supervision when turning     Turn 360 Degrees Needs assistance  while turning     Standing Unsupported, Alternately Place Feet on Step/Stool Needs assistance to keep from falling or unable to try     Standing Unsupported, One Foot in Front Needs help to step but can hold 15 seconds     Standing on One Leg Tries to lift leg/unable to hold 3 seconds but remains standing independently     Total Score 20     Berg comment: BERG  < 36 high risk for falls (close to 100%) 46-51 moderate (>50%)   37-45 significant (>80%) 52-55 lower (> 25%)                   CURRENT PROSTHETIC WEAR ASSESSMENT: 06/30/2022 Patient is dependent with: skin check, residual limb care, prosthetic cleaning, ply sock cleaning, correct ply sock adjustment, proper wear schedule/adjustment, and proper weight-bearing schedule/adjustment Donning prosthesis: SBA Doffing prosthesis: SBA Prosthetic wear tolerance: 2 hours, 2x/day, 5 days since delivery Prosthetic weight bearing tolerance: 5 minutes with residual limb pain 3-4/10 Edema: pitting Residual limb condition:  scab medially 97m and dry scab lateral scar 335m  Good hair growth, normal color & temperature.  Cylindrical shape Prosthetic description: silicon liner with pin lock suspension, total contact socket, dynamic response foot K code/activity level with prosthetic use: Level 3     TODAY'S TREATMENT:  08/26/2022 Prosthetic Training with TTA prosthesis PT recommended increasing wear to all awake hours checking for sweat every 3-4 hours. He may need to limit weight bearing  if distal tibia is hurting.  He can limit by time or assistive devices that give more UE support.  Pt verbalized understanding. Pt is planning 4-day vacation to [place] leaving this weekend.  PT recommended making & using a packing list for prosthetic items to ensure that he does not forget anything. PT used internet to determine his fishing chair which he reports he can sit/stand even on grass in his yard.  Discussed 3-legged fishing chair as option for shower chair  & possible "water leg" into the future.  PT demo & verbal cues on negotiating steeper inclines with sidestepping or transversing on diagonal. Pt return demo & verbalized understanding with cane.  Pt amb 200' with cane safely.  Berg Balance 45/56 see objective   08/19/2022 Prosthetic Training with TTA prosthesis Wound healed.  Continue with wear all awake hours except 1 hr midday and dry limb/liner mid of ea wear.  Stairs: descend with 2 rails alternating & with right rail / LUE cane step-to using RLE to lower.  Ascend with 2 rails & left rail / RUE cane alternating.  PT verbal & demo cues with prosthesis.  Pt amb 450' without device with cues on step width, working on scanning. Lifting & carrying 18# box with verbal cues;  moving box between surfaces with 90* turn & placing on hip height & knee height surface with verbal cues for technique & foot position. Pushing & Pulling with weight shift between feet - 30# weight in front with min guard and 20# weight behind with intermittent minA for balance.    08/17/2022 Prosthetic Training with TTA prosthesis Wound 22m X 139mwith no Tegaderm covering. No signs of infection.  Increase wear from arising to bedtime with 1 hr off mid-day, dry limb/liner half way.  Pt verbalized understanding. PT verbal cues on donning long pants with prosthesis, changing shoes, using wader or water proof vac bag, weighing in morning as MD recommended with prosthesis & fall risk when prosthesis off.  Pt verbalized understanding.  Pt amb 200' X 2 with cane with supervision working on scanning maintaining path/pace, walking eyes closed 3 steps to work on walking in dark & increasing speed to "cross a street." Pt amb 50' X 2 without device with supervision.  Pt walking backwards with cane with supervision. Pt neg ramp & curb with cane with supervision & verbal cues on technique.    HOME EXERCISE PROGRAM:  Access Code: [email or web address]:  [email or web address] Date: 07/20/2022 Prepared by: RoJamey ReasExercises - Seated Quad Set  - 3-5 x daily - 7 x weekly - 2-3 sets - 10-20 reps - 5 seconds hold - Straight Leg Raise with External Rotation  - 3-5 x daily - 7 x weekly - 2-3 sets - 10-20 reps - 5 seconds hold   Do each exercise 1-2  times per day Do each exercise 5-10 repetitions Hold each exercise for 2 seconds to feel your [place]  [place] Try to find this position when standing still for activities.   USE TAPE ON FLOOR TO MARK THE MIDLINE POSITION which is even with middle of sink.  You also should try to feel with your limb pressure in socket.  You are trying to feel with limb what you used to feel with the bottom of your foot.  Side to Side Shift: Moving your hips only (not shoulders): move weight onto your left leg, HOLD/FEEL pressure in socket.  Move back to equal weight on each leg, HOLD/FEEL pressure in socket. Move weight onto your right leg, HOLD/FEEL pressure in socket. Move back to equal weight on each leg, HOLD/FEEL pressure in socket. Repeat.  Start with both hands on sink, progress to hand on prosthetic side only, then no hands.  Front to Back Shift: Moving your hips only (not shoulders): move your weight forward onto your toes, HOLD/FEEL pressure in socket. Move your weight back to equal Flat Foot on both legs, HOLD/FEEL  pressure in socket. Move your weight back onto your heels, HOLD/FEEL  pressure in socket. Move your weight back to equal on both legs, HOLD/FEEL  pressure in socket. Repeat.  Start with both hands on sink, progress to hand on prosthetic side only, then no hands.  Moving Cones / Cups: With equal weight on each leg: Hold on with one hand the first time, then progress to no hand supports. Move cups from one side of sink to the other. Place cups ~2" out of your reach, progress to 10" beyond reach.  Place one  hand in middle of sink and reach with other hand. Do both arms.  Then hover one hand and move cups with other hand.  Overhead/Upward Reaching: alternated reaching up to top cabinets or ceiling if no cabinets present. Keep equal weight on each leg. Start with one hand support on counter while other hand reaches and progress to no hand support with reaching.  ace one hand in middle of sink and reach with other hand. Do both arms.  Then hover one hand and move cups with other hand.  5.   Looking Over Shoulders: With equal weight on each leg: alternate turning to look over your shoulders with one hand support on counter as needed.  Start with head motions only to look in front of shoulder, then even with shoulder and progress to looking behind you. To look to side, move head /eyes, then shoulder on side looking pulls back, shift more weight to side looking and pull hip back. Place one hand in middle of sink and let go with other hand so your shoulder can pull back. Switch hands to look other way.   Then hover one hand and look over shoulder. If looking right, use left hand at sink. If looking left, use right hand at sink. 6.  Stepping with leg that is not amputated:  Move items under cabinet out of your way. Shift your hips/pelvis so weight on prosthesis. Tighten muscles in hip on prosthetic side.  SLOWLY step other leg so front of foot is in cabinet. Then step back to floor.       ASSESSMENT: CLINICAL IMPRESSION: Patient had issue with distal tibia pain limiting his weight bearing tolerance but reports better with prosthetist socket modifications.  PT addressed some traveling tips with prosthesis prior to planned 4-day trip and he appears to understand including transversing inclines.  Pt continues to benefit from skilled care.    OBJECTIVE IMPAIRMENTS Abnormal gait, cardiopulmonary status limiting activity, decreased activity tolerance, decreased balance, decreased endurance, decreased knowledge of  condition, decreased knowledge of use of DME, decreased mobility, difficulty walking, decreased strength, increased edema, postural dysfunction, prosthetic dependency , obesity, pain, and skin integrity issues on residual limb .    ACTIVITY LIMITATIONS carrying, lifting, bending, sitting, standing, squatting, stairs, transfers, locomotion level, and safe prosthesis use   PARTICIPATION LIMITATIONS: driving, community activity, and recreational activities   [place], Time since onset of injury/illness/exacerbation, and 3+  comorbidities: see PMH  are also affecting patient's functional outcome.    REHAB POTENTIAL: Good   CLINICAL DECISION MAKING: Evolving/moderate complexity   EVALUATION COMPLEXITY: Moderate     GOALS: Goals reviewed with patient? Yes   SHORT TERM GOALS: Target date: 08/27/2022   Patient verbalizes adjusting ply socks & sweat management.  Baseline: SEE OBJECTIVE DATA Goal status: MET 08/26/2022 2.  Patient tolerates prosthesis >12 hrs total /day with no skin issues or limb pain after standing. Baseline: SEE OBJECTIVE DATA Goal status:  MET 08/26/2022  3.  Berg Balance Test >/= 36/56 Baseline: SEE OBJECTIVE DATA Goal status: MET 08/26/2022   4. Patient ambulates 200' with cane & prosthesis with supervision. Baseline: SEE OBJECTIVE DATA Goal status: MET 08/26/2022   5. Patient negotiates ramps & curbs with cane & prosthesis with minA. Baseline: SEE OBJECTIVE DATA Goal status:  MET 08/26/2022     LONG TERM GOALS: Target date: 09/24/2022   Patient demonstrates & verbalized understanding of prosthetic care to enable safe utilization of prosthesis. Baseline: SEE OBJECTIVE DATA Goal status: ongoing 08/26/2022   Patient tolerates prosthesis wear >90% of awake hours without skin or limb pain issues. Baseline: SEE OBJECTIVE DATA Goal status: ongoing 08/26/2022   Berg Balance > 48/56  to indicate lower fall risk Baseline: SEE OBJECTIVE DATA Goal  status: ongoing 08/26/2022   Patient ambulates >500' with prosthesis & cane or less independently Baseline: SEE OBJECTIVE DATA Goal status: ongoing 08/26/2022   Patient negotiates ramps, curbs & stairs with single rail with prosthesis cane or less independently. Baseline: SEE OBJECTIVE DATA Goal status: ongoing 08/26/2022   Patient demonstrates & verbalizes climbing ladder, lifting, carrying, pushing, pulling with prosthesis safely. Baseline: SEE OBJECTIVE DATA Goal status: ongoing 08/26/2022   Patient verbalizes & demonstrates understanding of fishing simulations.  Baseline: SEE OBJECTIVE DATA Goal status: ongoing 08/26/2022   PLAN: PT FREQUENCY: 2x/week   PT DURATION: 90 days / 13 weeks   PLANNED INTERVENTIONS: Therapeutic exercises, Therapeutic activity, Neuromuscular re-education, Balance training, Gait training, Patient/Family education, Stair training, Vestibular training, Prosthetic training, DME instructions, and Physical Performance Testing   PLAN FOR NEXT SESSION:  work towards [place]. Check how vacation went, Advance balance & gait activities. Continue with functional activities.  Progress wear to all awake hours with scheduled drying   Jamey Reas, PT, DPT 08/26/2022, 12:54 PM

## 2022-08-30 DIAGNOSIS — I255 Ischemic cardiomyopathy: Secondary | ICD-10-CM | POA: Diagnosis not present

## 2022-08-30 DIAGNOSIS — Z89511 Acquired absence of right leg below knee: Secondary | ICD-10-CM | POA: Diagnosis not present

## 2022-08-30 DIAGNOSIS — T8743 Infection of amputation stump, right lower extremity: Secondary | ICD-10-CM | POA: Diagnosis not present

## 2022-08-31 ENCOUNTER — Encounter: Payer: Medicare Other | Admitting: Physical Therapy

## 2022-09-02 ENCOUNTER — Encounter: Payer: Medicare Other | Admitting: Physical Therapy

## 2022-09-07 ENCOUNTER — Encounter: Payer: Medicare Other | Admitting: Physical Therapy

## 2022-09-07 NOTE — Therapy (Incomplete)
OUTPATIENT PHYSICAL THERAPY TREATMENT NOTE  Patient Name: Eduardo [name] MRN: [id number] DOB:03-Jan-1956, 66 y.o., male Today's Date: 09/07/2022  PCP: Rita Ohara, MD REFERRING PROVIDER: Newt Minion, MD      END OF SESSION:             Past Medical History:  Diagnosis Date   CHF (congestive heart failure) ([place])    Chronic kidney disease    stage 3   Colon polyp    Coronary artery disease    Diabetes mellitus 1987   under care of Dr. Chalmers Cater.  On insulin since 96 (off and on)   Diabetic retinopathy    Dupuytren contracture    R hand, s/p injection (Dr. Lenon Curt)   Essential hypertension, benign    Essential hypertension, benign 02/06/2019   Frequency of urination and polyuria    Hypertension    Myocardial infarction [facility])    denies   Neuromuscular disorder ([place])    Diabetic neuropathy   Osteomyelitis ([place])    right foot   Other testicular hypofunction    Peripheral arterial disease ([place]) 10/28/2012   Peritoneal abscess ([place]) 6/08   and buttock.   Pneumonia    Polydipsia    Proteinuria    Pure hyperglyceridemia    Subacute osteomyelitis, right ankle and foot ([place])    Wears glasses    Past Surgical History:  Procedure Laterality Date   ABDOMINAL AORTAGRAM N/A 04/18/2012   Procedure: ABDOMINAL Maxcine Ham;  Surgeon: Angelia Mould, MD;  Location: [facility] CATH LAB;  Service: Cardiovascular;  Laterality: N/A;   AMPUTATION Right 05/19/2019   Procedure: RIGHT FOOT 5TH RAY AMPUTATION;  Surgeon: Newt Minion, MD;  Location: [place];  Service: Orthopedics;  Laterality: Right;   AMPUTATION Right 03/11/2022   Procedure: RIGHT LEG DEBRIDEMENT VS. BELOW KNEE AMPUTATION;  Surgeon: Newt Minion, MD;  Location: [place];  Service: Orthopedics;  Laterality: Right;   CARDIAC CATHETERIZATION N/A 09/08/2016   Procedure: Left Heart Cath and Coronary Angiography;  Surgeon: Adrian Prows, MD;  Location: [place] CV LAB;  Service: Cardiovascular;  Laterality: N/A;   CATARACT EXTRACTION,  BILATERAL  09/2017, 10/2017   Dr. Herbert Deaner   COLONOSCOPY W/ BIOPSIES AND POLYPECTOMY     CORONARY/GRAFT ACUTE MI REVASCULARIZATION N/A 10/11/2020   Procedure: Coronary/Graft Acute MI Revascularization;  Surgeon: Adrian Prows, MD;  Location: [place] CV LAB;  Service: Cardiovascular;  Laterality: N/A;   I & D EXTREMITY Right 03/11/2022   Procedure: BELOW KNEE AMPUTATION;  Surgeon: Newt Minion, MD;  Location: [place];  Service: Orthopedics;  Laterality: Right;   LEFT HEART CATH N/A 12/31/2020   Procedure: Left Heart Cath;  Surgeon: Adrian Prows, MD;  Location: [place] CV LAB;  Service: Cardiovascular;  Laterality: N/A;   LEFT HEART CATH AND CORONARY ANGIOGRAPHY N/A 10/11/2020   Procedure: LEFT HEART CATH AND CORONARY ANGIOGRAPHY;  Surgeon: Adrian Prows, MD;  Location: [place] CV LAB;  Service: Cardiovascular;  Laterality: N/A;   LOWER EXTREMITY ANGIOGRAM Bilateral 04/18/2012   Procedure: LOWER EXTREMITY ANGIOGRAM;  Surgeon: Angelia Mould, MD;  Location: [facility] CATH LAB;  Service: Cardiovascular;  Laterality: Bilateral;  bilat lower extrem angio   LOWER EXTREMITY ANGIOGRAPHY Bilateral 05/02/2019   Procedure: LOWER EXTREMITY ANGIOGRAPHY;  Surgeon: Adrian Prows, MD;  Location: [place] CV LAB;  Service: Cardiovascular;  Laterality: Bilateral;   LOWER EXTREMITY ANGIOGRAPHY Bilateral 02/28/2019   Procedure: LOWER EXTREMITY ANGIOGRAPHY;  Surgeon: Adrian Prows, MD;  Location: [place] CV LAB;  Service: Cardiovascular;  Laterality: Bilateral;   macular photocoagulation     (eye treatments for diabetic retinopathy)-Dr. Zigmund Daniel   PERIPHERAL VASCULAR INTERVENTION  02/28/2019   Procedure: PERIPHERAL VASCULAR INTERVENTION;  Surgeon: Adrian Prows, MD;  Location: [place] CV LAB;  Service: Cardiovascular;;   TEE WITHOUT CARDIOVERSION N/A 03/18/2022   Procedure: TRANSESOPHAGEAL ECHOCARDIOGRAM (TEE);  Surgeon: Adrian Prows, MD;  Location: [facility] ENDOSCOPY;  Service: Cardiovascular;  Laterality: N/A;   VENTRICULAR  ASSIST DEVICE INSERTION N/A 12/31/2020   Procedure: VENTRICULAR ASSIST DEVICE INSERTION;  Surgeon: Adrian Prows, MD;  Location: [place] CV LAB;  Service: Cardiovascular;  Laterality: N/A;   Patient Active Problem List   Diagnosis Date Noted   LBBB (left bundle branch block) 70/48/8891   Chronic systolic heart failure ([place]) 08/19/2022   Chronic right-sided congestive heart failure ([place]) 05/18/2022   Ejection fraction < 50% 04/17/2022   Insomnia    Constipation    S/P BKA (below knee amputation), right ([place]) 03/21/2022   Ischemic cardiomyopathy    AKI (acute kidney injury) ([place]) 03/13/2022   Streptococcal bacteremia 03/13/2022   Hypophosphatemia 03/13/2022   Septic shock ([place]) 69/45/0388   Acute systolic heart failure ([place]) 12/28/2020   Acute pulmonary edema ([place]) 10/11/2020   Non-ST elevation (NSTEMI) myocardial infarction ([place]) 10/11/2020   Acute respiratory distress 10/11/2020   Acute on chronic combined systolic and diastolic CHF (congestive heart failure) ([place]) 10/11/2020   Cardiogenic shock (HCC)    Partial nontraumatic amputation of foot, right ([place]) 06/21/2019   Osteomyelitis of right foot ([place])    Essential hypertension, benign 02/06/2019   Hypercholesteremia 02/06/2019   Stage 3b chronic kidney disease (CKD) ([place]) 10/06/2017   Proteinuria 10/06/2017   Controlled type 2 diabetes mellitus with hyperglycemia, with long-term current use of insulin ([place]) 10/06/2017   Coronary artery disease involving native coronary artery of native heart without angina pectoris 05/23/2017   Abnormal nuclear stress test 09/06/2016   PAD (peripheral artery disease) ([place]) 10/28/2012   Morbid obesity with BMI of 40.0-44.9, adult ([place]) 10/13/2012   Diabetes mellitus with ophthalmic complication ([place]) 82/80/0349   Hypertension associated with diabetes ([place]) 10/13/2012   Atherosclerosis of native artery of extremity with intermittent claudication ([place]) 04/05/2012   Adenomatous colon polyp  09/28/2011   Diabetes mellitus ([place]) 09/28/2011   Pure hyperglyceridemia 09/28/2011   Erectile dysfunction 09/28/2011   Diabetic retinopathy ([place]) 09/28/2011    REFERRING DIAG: Z89.511 (ICD-10-CM) - S/P below knee amputation, right  ONSET DATE: 06/25/2022 prosthesis delivery  THERAPY DIAG:  No diagnosis found.  Rationale for Evaluation and Treatment Rehabilitation  PERTINENT HISTORY: combined systolic & diastolic CHF/cardiomyopathy EF 15-20%, DM2, PVD/PAD, CAD, CABG 2017, HTN, HLD, morbid obesity, CKD st3b, diabetic retinopathy  PRECAUTIONS: None  SUBJECTIVE:  *** He saw prosthetist yesterday who added pre-tibial pads & increased distal tibia relief.  He went from 10-ply to 1-ply fit.    PAIN:  Are you having pain?   0/10 today  OBJECTIVE: (objective measures completed at initial evaluation unless otherwise dated)  OBJECTIVE:    COGNITION: 06/30/2022  Overall cognitive status: Within functional limits for tasks assessed   POSTURE: 06/30/2022  rounded shoulders, forward head, and weight shift left   LOWER EXTREMITY ROM:   ROM P:passive  A:active Right Eval 06/30/2022 Left eval  Hip flexion      Hip extension      Hip abduction      Hip adduction      Hip internal rotation      Hip external  rotation      Knee flexion      Knee extension 0*    Ankle dorsiflexion      Ankle plantarflexion      Ankle inversion      Ankle eversion       (Blank rows = not tested)   LOWER EXTREMITY MMT:   MMT Right Eval 06/30/2022 Left eval  Hip flexion      Hip extension 4/5    Hip abduction 4/5    Hip adduction      Hip internal rotation      Hip external rotation      Knee flexion 4/5    Knee extension 4/5    Ankle dorsiflexion NA    Ankle plantarflexion NA    Ankle inversion NA    Ankle eversion NA    Evaluation testing functionally (Blank rows = not tested)   TRANSFERS: 06/30/2022  Sit to stand: SBA from chairs with armrests using back of LLE against chair to  stabilize 06/30/2022   Stand to sit:  SBA using UEs to control descent   GAIT: 07/20/2022 Pt ambulates with step-through pattern with RW and Rt medial whip  06/30/2022 Gait pattern: step to pattern, decreased step length- Left, decreased stance time- Right, decreased stride length, decreased hip/knee flexion- Right, Right hip hike, antalgic, and abducted- Right Distance walked: 100' Assistive device utilized: Environmental consultant - 2 wheeled and TTA prosthesis Level of assistance: SBA  needs cues for gait deviations / no assist physically with RW support Comments: excessive UE weight bearing on RW to limit weight on prosthesis.    FUNCTIONAL TESTs:  Berg Balance Scale: 08/26/2022  45/56      Berg Balance Scale: 06/30/2022:   20/56   [facility] PT Assessment - 06/30/22 1345                Standardized Balance Assessment    Standardized Balance Assessment Berg Balance Test          Berg Balance Test    Sit to Stand Needs minimal aid to stand or to stabilize     Standing Unsupported Able to stand 2 minutes with supervision     Sitting with Back Unsupported but Feet Supported on Floor or Stool Able to sit safely and securely 2 minutes     Stand to Sit Controls descent by using hands     Transfers Able to transfer safely, definite need of hands     Standing Unsupported with Eyes Closed Unable to keep eyes closed 3 seconds but stays steady     Standing Unsupported with Feet Together Needs help to attain position but able to stand for 30 seconds with feet together     From Standing, Reach Forward with Outstretched Arm Reaches forward but needs supervision     From Standing Position, Pick up Object from Floor Unable to try/needs assist to keep balance     From Standing Position, Turn to Look Behind Over each Shoulder Needs supervision when turning     Turn 360 Degrees Needs assistance while turning     Standing Unsupported, Alternately Place Feet on Step/Stool Needs assistance to keep from falling or unable  to try     Standing Unsupported, One Foot in Front Needs help to step but can hold 15 seconds     Standing on One Leg Tries to lift leg/unable to hold 3 seconds but remains standing independently     Total Score 20     Merrilee Jansky  comment: BERG  < 36 high risk for falls (close to 100%) 46-51 moderate (>50%)   37-45 significant (>80%) 52-55 lower (> 25%)                   CURRENT PROSTHETIC WEAR ASSESSMENT: 06/30/2022 Patient is dependent with: skin check, residual limb care, prosthetic cleaning, ply sock cleaning, correct ply sock adjustment, proper wear schedule/adjustment, and proper weight-bearing schedule/adjustment Donning prosthesis: SBA Doffing prosthesis: SBA Prosthetic wear tolerance: 2 hours, 2x/day, 5 days since delivery Prosthetic weight bearing tolerance: 5 minutes with residual limb pain 3-4/10 Edema: pitting Residual limb condition:  scab medially 53m and dry scab lateral scar 352m  Good hair growth, normal color & temperature.  Cylindrical shape Prosthetic description: silicon liner with pin lock suspension, total contact socket, dynamic response foot K code/activity level with prosthetic use: Level 3     TODAY'S TREATMENT:  09/07/2022 ***  08/26/2022 Prosthetic Training with TTA prosthesis PT recommended increasing wear to all awake hours checking for sweat every 3-4 hours. He may need to limit weight bearing if distal tibia is hurting.  He can limit by time or assistive devices that give more UE support.  Pt verbalized understanding. Pt is planning 4-day vacation to [place] this weekend.  PT recommended making & using a packing list for prosthetic items to ensure that he does not forget anything. PT used internet to determine his fishing chair which he reports he can sit/stand even on grass in his yard.  Discussed 3-legged fishing chair as option for shower chair & possible "water leg" into the future.  PT demo & verbal cues on negotiating steeper inclines with  sidestepping or transversing on diagonal. Pt return demo & verbalized understanding with cane.  Pt amb 200' with cane safely.  Berg Balance 45/56 see objective   08/19/2022 Prosthetic Training with TTA prosthesis Wound healed.  Continue with wear all awake hours except 1 hr midday and dry limb/liner mid of ea wear.  Stairs: descend with 2 rails alternating & with right rail / LUE cane step-to using RLE to lower.  Ascend with 2 rails & left rail / RUE cane alternating.  PT verbal & demo cues with prosthesis.  Pt amb 450' without device with cues on step width, working on scanning. Lifting & carrying 18# box with verbal cues;  moving box between surfaces with 90* turn & placing on hip height & knee height surface with verbal cues for technique & foot position. Pushing & Pulling with weight shift between feet - 30# weight in front with min guard and 20# weight behind with intermittent minA for balance.     HOME EXERCISE PROGRAM:  Access Code: [email or web address]: [email or web address] Date: 07/20/2022 Prepared by: RoJamey ReasExercises - Seated Quad Set  - 3-5 x daily - 7 x weekly - 2-3 sets - 10-20 reps - 5 seconds hold - Straight Leg Raise with External Rotation  - 3-5 x daily - 7 x weekly - 2-3 sets - 10-20 reps - 5 seconds hold   Do each exercise 1-2  times per day Do each exercise 5-10 repetitions Hold each exercise for 2 seconds to feel your location  [place] Try to find this position when standing still for activities.   USE TAPE ON FLOOR TO MARK THE MIDLINE POSITION which is even with middle of sink.  You also should try to feel with your limb  pressure in socket.  You are trying to feel with limb what you used to feel with the bottom of your foot.  Side to Side Shift: Moving your hips only (not shoulders): move weight onto your left leg, HOLD/FEEL pressure in socket.  Move back to equal  weight on each leg, HOLD/FEEL pressure in socket. Move weight onto your right leg, HOLD/FEEL pressure in socket. Move back to equal weight on each leg, HOLD/FEEL pressure in socket. Repeat.  Start with both hands on sink, progress to hand on prosthetic side only, then no hands.  Front to Back Shift: Moving your hips only (not shoulders): move your weight forward onto your toes, HOLD/FEEL pressure in socket. Move your weight back to equal Flat Foot on both legs, HOLD/FEEL  pressure in socket. Move your weight back onto your heels, HOLD/FEEL  pressure in socket. Move your weight back to equal on both legs, HOLD/FEEL  pressure in socket. Repeat.  Start with both hands on sink, progress to hand on prosthetic side only, then no hands.  Moving Cones / Cups: With equal weight on each leg: Hold on with one hand the first time, then progress to no hand supports. Move cups from one side of sink to the other. Place cups ~2" out of your reach, progress to 10" beyond reach.  Place one hand in middle of sink and reach with other hand. Do both arms.  Then hover one hand and move cups with other hand.  Overhead/Upward Reaching: alternated reaching up to top cabinets or ceiling if no cabinets present. Keep equal weight on each leg. Start with one hand support on counter while other hand reaches and progress to no hand support with reaching.  ace one hand in middle of sink and reach with other hand. Do both arms.  Then hover one hand and move cups with other hand.  5.   Looking Over Shoulders: With equal weight on each leg: alternate turning to look over your shoulders with one hand support on counter as needed.  Start with head motions only to look in front of shoulder, then even with shoulder and progress to looking behind you. To look to side, move head /eyes, then shoulder on side looking pulls back, shift more weight to side looking and pull hip back. Place one hand in middle of sink and let go with other hand so your  shoulder can pull back. Switch hands to look other way.   Then hover one hand and look over shoulder. If looking right, use left hand at sink. If looking left, use right hand at sink. 6.  Stepping with leg that is not amputated:  Move items under cabinet out of your way. Shift your hips/pelvis so weight on prosthesis. Tighten muscles in hip on prosthetic side.  SLOWLY step other leg so front of foot is in cabinet. Then step back to floor.       ASSESSMENT: CLINICAL IMPRESSION: *** Patient had issue with distal tibia pain limiting his weight bearing tolerance but reports better with prosthetist socket modifications.  PT addressed some traveling tips with prosthesis prior to planned 4-day trip and he appears to understand including transversing inclines.  Pt continues to benefit from skilled care.    OBJECTIVE IMPAIRMENTS Abnormal gait, cardiopulmonary status limiting activity, decreased activity tolerance, decreased balance, decreased endurance, decreased knowledge of condition, decreased knowledge of use of DME, decreased mobility, difficulty walking, decreased strength, increased edema, postural dysfunction, prosthetic dependency , obesity, pain, and skin integrity issues  on residual limb .    ACTIVITY LIMITATIONS carrying, lifting, bending, sitting, standing, squatting, stairs, transfers, locomotion level, and safe prosthesis use   PARTICIPATION LIMITATIONS: driving, community activity, and recreational activities   [place], Time since onset of injury/illness/exacerbation, and 3+ comorbidities: see PMH  are also affecting patient's functional outcome.    REHAB POTENTIAL: Good   CLINICAL DECISION MAKING: Evolving/moderate complexity   EVALUATION COMPLEXITY: Moderate     GOALS: Goals reviewed with patient? Yes   SHORT TERM GOALS: Target date: 08/27/2022   Patient verbalizes adjusting ply socks & sweat management.  Baseline: SEE OBJECTIVE DATA Goal status: MET  08/26/2022 2.  Patient tolerates prosthesis >12 hrs total /day with no skin issues or limb pain after standing. Baseline: SEE OBJECTIVE DATA Goal status:  MET 08/26/2022  3.  Berg Balance Test >/= 36/56 Baseline: SEE OBJECTIVE DATA Goal status: MET 08/26/2022   4. Patient ambulates 200' with cane & prosthesis with supervision. Baseline: SEE OBJECTIVE DATA Goal status: MET 08/26/2022   5. Patient negotiates ramps & curbs with cane & prosthesis with minA. Baseline: SEE OBJECTIVE DATA Goal status:  MET 08/26/2022     LONG TERM GOALS: Target date: 09/24/2022   Patient demonstrates & verbalized understanding of prosthetic care to enable safe utilization of prosthesis. Baseline: SEE OBJECTIVE DATA Goal status: ongoing 08/26/2022   Patient tolerates prosthesis wear >90% of awake hours without skin or limb pain issues. Baseline: SEE OBJECTIVE DATA Goal status: ongoing 08/26/2022   Berg Balance > 48/56  to indicate lower fall risk Baseline: SEE OBJECTIVE DATA Goal status: ongoing 08/26/2022   Patient ambulates >500' with prosthesis & cane or less independently Baseline: SEE OBJECTIVE DATA Goal status: ongoing 08/26/2022   Patient negotiates ramps, curbs & stairs with single rail with prosthesis cane or less independently. Baseline: SEE OBJECTIVE DATA Goal status: ongoing 08/26/2022   Patient demonstrates & verbalizes climbing ladder, lifting, carrying, pushing, pulling with prosthesis safely. Baseline: SEE OBJECTIVE DATA Goal status: ongoing 08/26/2022   Patient verbalizes & demonstrates understanding of fishing simulations.  Baseline: SEE OBJECTIVE DATA Goal status: ongoing 08/26/2022   PLAN: PT FREQUENCY: 2x/week   PT DURATION: 90 days / 13 weeks   PLANNED INTERVENTIONS: Therapeutic exercises, Therapeutic activity, Neuromuscular re-education, Balance training, Gait training, Patient/Family education, Stair training, Vestibular training, Prosthetic training, DME  instructions, and Physical Performance Testing   PLAN FOR NEXT SESSION:  ***  work towards [place]. Check how vacation went, Advance balance & gait activities. Continue with functional activities.  Progress wear to all awake hours with scheduled drying   Jamey Reas, PT, DPT 09/07/2022, 7:49 AM

## 2022-09-09 ENCOUNTER — Encounter: Payer: Medicare Other | Admitting: Physical Therapy

## 2022-09-11 ENCOUNTER — Other Ambulatory Visit: Payer: Self-pay

## 2022-09-11 MED ORDER — TORSEMIDE 20 MG PO TABS: 20.0000 mg | ORAL_TABLET | Freq: Two times a day (BID) | ORAL | 1 refills | Status: DC

## 2022-09-11 MED ORDER — CLOPIDOGREL BISULFATE 75 MG PO TABS: 75.0000 mg | ORAL_TABLET | Freq: Every day | ORAL | 1 refills | Status: DC

## 2022-09-14 ENCOUNTER — Ambulatory Visit: Payer: Medicare Other | Admitting: Physical Therapy

## 2022-09-14 ENCOUNTER — Encounter: Payer: Self-pay | Admitting: Physical Therapy

## 2022-09-14 DIAGNOSIS — R2689 Other abnormalities of gait and mobility: Secondary | ICD-10-CM

## 2022-09-14 DIAGNOSIS — M6281 Muscle weakness (generalized): Secondary | ICD-10-CM | POA: Diagnosis not present

## 2022-09-14 DIAGNOSIS — M79661 Pain in right lower leg: Secondary | ICD-10-CM | POA: Diagnosis not present

## 2022-09-14 DIAGNOSIS — R2681 Unsteadiness on feet: Secondary | ICD-10-CM | POA: Diagnosis not present

## 2022-09-14 DIAGNOSIS — R293 Abnormal posture: Secondary | ICD-10-CM

## 2022-09-14 NOTE — Therapy (Addendum)
PHYSICAL THERAPY DISCHARGE SUMMARY (10/20/2022)  Visits from Start of Care: 15  Current functional level related to goals / functional outcomes: Patient remarkable progress with PT for functioning with his prosthesis. However he has not returned for PT since 09/14/2022 visit, so PT in unable to comment on progress towards goals or current level of function.  See note below for last known functional level.    Remaining deficits: Patient remarkable progress with PT for functioning with his prosthesis. However he has not returned for PT since 09/14/2022 visit, so PT in unable to comment on progress towards goals or current level of function.  See note below for last known functional level.    Education / Equipment: Prosthetic care instructions.    Patient agrees to discharge. Patient goals were not met. Patient is being discharged due to not returning since the last visit.   Jamey Reas, PT 10/20/2022, 11:40 AM   OUTPATIENT PHYSICAL THERAPY TREATMENT NOTE  Patient Name: Eduardo [name] MRN: [id number] DOB:[birth year], 66 y.o., male Today's Date: 09/14/2022  PCP: Rita Ohara, MD REFERRING PROVIDER: Newt Minion, MD      END OF SESSION:   PT End of Session - 09/14/22 1105     Visit Number 15    Number of Visits 26    Date for PT Re-Evaluation 09/24/22    Authorization Type [employer] Medicare    Authorization Time Period $20 copay    Progress Note Due on Visit 20    PT Start Time 1100    PT Stop Time 1141    PT Time Calculation (min) 41 min    Equipment Utilized During Treatment Gait belt    Activity Tolerance Patient tolerated treatment well;Patient limited by pain    Behavior During Therapy WFL for tasks assessed/performed                      Past Medical History:  Diagnosis Date   CHF (congestive heart failure) ([place])    Chronic kidney disease    stage 3   Colon polyp    Coronary artery disease    Diabetes mellitus 1987   under care of Dr. Chalmers Cater.  On  insulin since 96 (off and on)   Diabetic retinopathy    Dupuytren contracture    R hand, s/p injection (Dr. Lenon Curt)   Essential hypertension, benign    Essential hypertension, benign 02/06/2019   Frequency of urination and polyuria    Hypertension    Myocardial infarction [facility])    denies   Neuromuscular disorder ([place])    Diabetic neuropathy   Osteomyelitis ([place])    right foot   Other testicular hypofunction    Peripheral arterial disease ([place]) 10/28/2012   Peritoneal abscess ([place]) 6/08   and buttock.   Pneumonia    Polydipsia    Proteinuria    Pure hyperglyceridemia    Subacute osteomyelitis, right ankle and foot ([place])    Wears glasses    Past Surgical History:  Procedure Laterality Date   ABDOMINAL AORTAGRAM N/A 04/18/2012   Procedure: ABDOMINAL Maxcine Ham;  Surgeon: Angelia Mould, MD;  Location[facility] CATH LAB;  Service: Cardiovascular;  Laterality: N/A;   AMPUTATION Right 05/19/2019   Procedure: RIGHT FOOT 5TH RAY AMPUTATION;  Surgeon: Newt Minion, MD;  Location: [place];  Service: Orthopedics;  Laterality: Right;   AMPUTATION Right 03/11/2022   Procedure: RIGHT LEG DEBRIDEMENT VS. BELOW KNEE AMPUTATION;  Surgeon: Newt Minion, MD;  Location: [place];  Service: Orthopedics;  Laterality: Right;   CARDIAC CATHETERIZATION N/A 09/08/2016   Procedure: Left Heart Cath and Coronary Angiography;  Surgeon: Adrian Prows, MD;  Location: [place] CV LAB;  Service: Cardiovascular;  Laterality: N/A;   CATARACT EXTRACTION, BILATERAL  09/2017, 10/2017   Dr. Herbert Deaner   COLONOSCOPY W/ BIOPSIES AND POLYPECTOMY     CORONARY/GRAFT ACUTE MI REVASCULARIZATION N/A 10/11/2020   Procedure: Coronary/Graft Acute MI Revascularization;  Surgeon: Adrian Prows, MD;  Location: [place] CV LAB;  Service: Cardiovascular;  Laterality: N/A;   I & D EXTREMITY Right 03/11/2022   Procedure: BELOW KNEE AMPUTATION;  Surgeon: Newt Minion, MD;  Location: [place];  Service: Orthopedics;  Laterality: Right;   LEFT HEART  CATH N/A 12/31/2020   Procedure: Left Heart Cath;  Surgeon: Adrian Prows, MD;  Location: [place] CV LAB;  Service: Cardiovascular;  Laterality: N/A;   LEFT HEART CATH AND CORONARY ANGIOGRAPHY N/A 10/11/2020   Procedure: LEFT HEART CATH AND CORONARY ANGIOGRAPHY;  Surgeon: Adrian Prows, MD;  Location: [place] CV LAB;  Service: Cardiovascular;  Laterality: N/A;   LOWER EXTREMITY ANGIOGRAM Bilateral 04/18/2012   Procedure: LOWER EXTREMITY ANGIOGRAM;  Surgeon: Angelia Mould, MD;  Location[facility] CATH LAB;  Service: Cardiovascular;  Laterality: Bilateral;  bilat lower extrem angio   LOWER EXTREMITY ANGIOGRAPHY Bilateral 05/02/2019   Procedure: LOWER EXTREMITY ANGIOGRAPHY;  Surgeon: Adrian Prows, MD;  Location: [place] CV LAB;  Service: Cardiovascular;  Laterality: Bilateral;   LOWER EXTREMITY ANGIOGRAPHY Bilateral 02/28/2019   Procedure: LOWER EXTREMITY ANGIOGRAPHY;  Surgeon: Adrian Prows, MD;  Location: [place] CV LAB;  Service: Cardiovascular;  Laterality: Bilateral;   macular photocoagulation     (eye treatments for diabetic retinopathy)-Dr. Zigmund Daniel   PERIPHERAL VASCULAR INTERVENTION  02/28/2019   Procedure: PERIPHERAL VASCULAR INTERVENTION;  Surgeon: Adrian Prows, MD;  Location: [place] CV LAB;  Service: Cardiovascular;;   TEE WITHOUT CARDIOVERSION N/A 03/18/2022   Procedure: TRANSESOPHAGEAL ECHOCARDIOGRAM (TEE);  Surgeon: Adrian Prows, MD;  Location: [facility] ENDOSCOPY;  Service: Cardiovascular;  Laterality: N/A;   VENTRICULAR ASSIST DEVICE INSERTION N/A 12/31/2020   Procedure: VENTRICULAR ASSIST DEVICE INSERTION;  Surgeon: Adrian Prows, MD;  Location: [place] CV LAB;  Service: Cardiovascular;  Laterality: N/A;   Patient Active Problem List   Diagnosis Date Noted   LBBB (left bundle branch block) 40/98/1191   Chronic systolic heart failure ([place]) 08/19/2022   Chronic right-sided congestive heart failure ([place]) 05/18/2022   Ejection fraction < 50% 04/17/2022   Insomnia    Constipation    S/P  BKA (below knee amputation), right ([place]) 03/21/2022   Ischemic cardiomyopathy    AKI (acute kidney injury) ([place]) 03/13/2022   Streptococcal bacteremia 03/13/2022   Hypophosphatemia 03/13/2022   Septic shock ([place]) 47/82/9562   Acute systolic heart failure ([place]) 12/28/2020   Acute pulmonary edema ([place]) 10/11/2020   Non-ST elevation (NSTEMI) myocardial infarction ([place]) 10/11/2020   Acute respiratory distress 10/11/2020   Acute on chronic combined systolic and diastolic CHF (congestive heart failure) ([place]) 10/11/2020   Cardiogenic shock (HCC)    Partial nontraumatic amputation of foot, right ([place]) 06/21/2019   Osteomyelitis of right foot ([place])    Essential hypertension, benign 02/06/2019   Hypercholesteremia 02/06/2019   Stage 3b chronic kidney disease (CKD) ([place]) 10/06/2017   Proteinuria 10/06/2017   Controlled type 2 diabetes mellitus with hyperglycemia, with long-term current use of insulin ([place]) 10/06/2017   Coronary artery disease involving native coronary artery of native heart without angina pectoris 05/23/2017  Abnormal nuclear stress test 09/06/2016   PAD (peripheral artery disease) ([place]) 10/28/2012   Morbid obesity with BMI of 40.0-44.9, adult ([place]) 10/13/2012   Diabetes mellitus with ophthalmic complication ([place]) 86/57/8469   Hypertension associated with diabetes ([place]) 10/13/2012   Atherosclerosis of native artery of extremity with intermittent claudication ([place]) 04/05/2012   Adenomatous colon polyp 09/28/2011   Diabetes mellitus ([place]) 09/28/2011   Pure hyperglyceridemia 09/28/2011   Erectile dysfunction 09/28/2011   Diabetic retinopathy ([place]) 09/28/2011    REFERRING DIAG: Z89.511 (ICD-10-CM) - S/P below knee amputation, right  ONSET DATE: 06/25/2022 prosthesis delivery  THERAPY DIAG:  Other abnormalities of gait and mobility  Unsteadiness on feet  Muscle weakness (generalized)  Abnormal posture  Pain in right lower leg  Rationale for Evaluation and Treatment  Rehabilitation  PERTINENT HISTORY: combined systolic & diastolic CHF/cardiomyopathy EF 15-20%, DM2, PVD/PAD, CAD, CABG 2017, HTN, HLD, morbid obesity, CKD st3b, diabetic retinopathy  PRECAUTIONS: None  SUBJECTIVE:  He saw got an intestinal virus [place].  He is still wearing prosthesis most of awake hours but was less active.  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 He used some of PT recommendations with traveling which helped.   PAIN:  Are you having pain?   0/10 today  OBJECTIVE: (objective measures completed at initial evaluation unless otherwise dated)  OBJECTIVE:    COGNITION: 06/30/2022  Overall cognitive status: Within functional limits for tasks assessed   POSTURE: 06/30/2022  rounded shoulders, forward head, and weight shift left   LOWER EXTREMITY ROM:   ROM P:passive  A:active Right Eval 06/30/2022 Left eval  Hip flexion      Hip extension      Hip abduction      Hip adduction      Hip internal rotation      Hip external rotation      Knee flexion      Knee extension 0*    Ankle dorsiflexion      Ankle plantarflexion      Ankle inversion      Ankle eversion       (Blank rows = not tested)   LOWER EXTREMITY MMT:   MMT Right Eval 06/30/2022 Left eval  Hip flexion      Hip extension 4/5    Hip abduction 4/5    Hip adduction      Hip internal rotation      Hip external rotation      Knee flexion 4/5    Knee extension 4/5    Ankle dorsiflexion NA    Ankle plantarflexion NA    Ankle inversion NA    Ankle eversion NA    Evaluation testing functionally (Blank rows = not tested)   TRANSFERS: 06/30/2022  Sit to stand: SBA from chairs with armrests using back of LLE against chair to stabilize 06/30/2022   Stand to sit:  SBA using UEs to control descent   GAIT: 07/20/2022 Pt ambulates with step-through pattern with RW and Rt medial whip  06/30/2022 Gait pattern: step to pattern, decreased step length- Left, decreased stance time- Right,  decreased stride length, decreased hip/knee flexion- Right, Right hip hike, antalgic, and abducted- Right Distance walked: 100' Assistive device utilized: Environmental consultant - 2 wheeled and TTA prosthesis Level of assistance: SBA  needs cues for gait deviations / no assist physically with RW support Comments: excessive UE weight bearing on RW to limit weight on prosthesis.    FUNCTIONAL TESTs:  Merrilee Jansky Balance Scale: 08/26/2022  45/56  Berg Balance Scale: 06/30/2022:   20/56   [facility] PT Assessment - 06/30/22 1345                Standardized Balance Assessment    Standardized Balance Assessment Berg Balance Test          Berg Balance Test    Sit to Stand Needs minimal aid to stand or to stabilize     Standing Unsupported Able to stand 2 minutes with supervision     Sitting with Back Unsupported but Feet Supported on Floor or Stool Able to sit safely and securely 2 minutes     Stand to Sit Controls descent by using hands     Transfers Able to transfer safely, definite need of hands     Standing Unsupported with Eyes Closed Unable to keep eyes closed 3 seconds but stays steady     Standing Unsupported with Feet Together Needs help to attain position but able to stand for 30 seconds with feet together     From Standing, Reach Forward with Outstretched Arm Reaches forward but needs supervision     From Standing Position, Pick up Object from Floor Unable to try/needs assist to keep balance     From Standing Position, Turn to Look Behind Over each Shoulder Needs supervision when turning     Turn 360 Degrees Needs assistance while turning     Standing Unsupported, Alternately Place Feet on Step/Stool Needs assistance to keep from falling or unable to try     Standing Unsupported, One Foot in Front Needs help to step but can hold 15 seconds     Standing on One Leg Tries to lift leg/unable to hold 3 seconds but remains standing independently     Total Score 20     Berg comment: BERG  < 36 high risk for  falls (close to 100%) 46-51 moderate (>50%)   37-45 significant (>80%) 52-55 lower (> 25%)                   CURRENT PROSTHETIC WEAR ASSESSMENT: 06/30/2022 Patient is dependent with: skin check, residual limb care, prosthetic cleaning, ply sock cleaning, correct ply sock adjustment, proper wear schedule/adjustment, and proper weight-bearing schedule/adjustment Donning prosthesis: SBA Doffing prosthesis: SBA Prosthetic wear tolerance: 2 hours, 2x/day, 5 days since delivery Prosthetic weight bearing tolerance: 5 minutes with residual limb pain 3-4/10 Edema: pitting Residual limb condition:  scab medially 41m and dry scab lateral scar 380m  Good hair growth, normal color & temperature.  Cylindrical shape Prosthetic description: silicon liner with pin lock suspension, total contact socket, dynamic response foot K code/activity level with prosthetic use: Level 3     TODAY'S TREATMENT:  09/14/2022 Prosthetic Training with TTA prosthesis: Pt's residual limb has no open areas and skin has no changes noting concern.  PT instructed in donning socks & pt return demo.   Neuromuscular re-education: Standing crossways on foam beam to facilitate ankle/residual limb & hip strategies for balance: -static eyes open -static eyes closed only up to 3 sec -eyes open head movements 4 directions with intermittent touch //bars & PT tactile cues, visual cues for upright with mirror. Step strategy anticipatory stepping off foam beam forward, backward and laterally right/left. He required touch on //bars and >1 step most of time to catch balance but improved esp with LLE (non-amputated LE). PT demo & verbal cues how to work on step strategy in corner. Pt verbalized understanding.    08/26/2022 Prosthetic Training with TTA prosthesis  PT recommended increasing wear to all awake hours checking for sweat every 3-4 hours. He may need to limit weight bearing if distal tibia is hurting.  He can limit by time or  assistive devices that give more UE support.  Pt verbalized understanding. Pt is planning 4-day vacation to [place] leaving this weekend.  PT recommended making & using a packing list for prosthetic items to ensure that he does not forget anything. PT used internet to determine his fishing chair which he reports he can sit/stand even on grass in his yard.  Discussed 3-legged fishing chair as option for shower chair & possible "water leg" into the future.  PT demo & verbal cues on negotiating steeper inclines with sidestepping or transversing on diagonal. Pt return demo & verbalized understanding with cane.  Pt amb 200' with cane safely.  Berg Balance 45/56 see objective   08/19/2022 Prosthetic Training with TTA prosthesis Wound healed.  Continue with wear all awake hours except 1 hr midday and dry limb/liner mid of ea wear.  Stairs: descend with 2 rails alternating & with right rail / LUE cane step-to using RLE to lower.  Ascend with 2 rails & left rail / RUE cane alternating.  PT verbal & demo cues with prosthesis.  Pt amb 450' without device with cues on step width, working on scanning. Lifting & carrying 18# box with verbal cues;  moving box between surfaces with 90* turn & placing on hip height & knee height surface with verbal cues for technique & foot position. Pushing & Pulling with weight shift between feet - 30# weight in front with min guard and 20# weight behind with intermittent minA for balance.     HOME EXERCISE PROGRAM:  Access Code: [email or web address] URL: [email or web address] Date: 07/20/2022 Prepared by: Jamey Reas  Exercises - Seated Quad Set  - 3-5 x daily - 7 x weekly - 2-3 sets - 10-20 reps - 5 seconds hold - Straight Leg Raise with External Rotation  - 3-5 x daily - 7 x weekly - 2-3 sets - 10-20 reps - 5 seconds hold   Do each exercise 1-2  times per day Do each exercise 5-10 repetitions Hold each exercise for 2 seconds to feel your location  AT [place].  Try to find this position when standing still for activities.   USE TAPE ON FLOOR TO MARK THE MIDLINE POSITION which is even with middle of sink.  You also should try to feel with your limb pressure in socket.  You are trying to feel with limb what you used to feel with the bottom of your foot.  Side to Side Shift: Moving your hips only (not shoulders): move weight onto your left leg, HOLD/FEEL pressure in socket.  Move back to equal weight on each leg, HOLD/FEEL pressure in socket. Move weight onto your right leg, HOLD/FEEL pressure in socket. Move back to equal weight on each leg, HOLD/FEEL pressure in socket. Repeat.  Start with both hands on sink, progress to hand on prosthetic side only, then no hands.  Front to Back Shift: Moving your hips only (not shoulders): move your weight forward onto your toes, HOLD/FEEL pressure in socket. Move your weight back to equal Flat Foot on both legs, HOLD/FEEL  pressure in socket. Move your weight back onto your heels, HOLD/FEEL  pressure in socket. Move your weight back to equal on both legs, HOLD/FEEL  pressure in socket. Repeat.  Start with both hands on sink, progress to hand on prosthetic side only, then no hands.  Moving Cones / Cups: With equal weight on each leg: Hold on with one hand the first time, then progress to no hand supports. Move cups from one side of sink to the other. Place cups ~2" out of your reach, progress to 10" beyond reach.  Place one hand in middle of sink and reach with other hand. Do both arms.  Then hover one hand and move cups with other hand.  Overhead/Upward Reaching: alternated reaching up to top cabinets or ceiling if no cabinets present. Keep equal weight on each leg. Start with one hand support on counter while other hand reaches and progress to no hand support with reaching.  ace one hand in middle of sink and reach with other hand. Do both arms.  Then  hover one hand and move cups with other hand.  5.   Looking Over Shoulders: With equal weight on each leg: alternate turning to look over your shoulders with one hand support on counter as needed.  Start with head motions only to look in front of shoulder, then even with shoulder and progress to looking behind you. To look to side, move head /eyes, then shoulder on side looking pulls back, shift more weight to side looking and pull hip back. Place one hand in middle of sink and let go with other hand so your shoulder can pull back. Switch hands to look other way.   Then hover one hand and look over shoulder. If looking right, use left hand at sink. If looking left, use right hand at sink. 6.  Stepping with leg that is not amputated:  Move items under cabinet out of your way. Shift your hips/pelvis so weight on prosthesis. Tighten muscles in hip on prosthetic side.  SLOWLY step other leg so front of foot is in cabinet. Then step back to floor.       ASSESSMENT: CLINICAL IMPRESSION: PT worked on balance activities to facilitate ankle/residual limb, hip & step strategies. He improved with instruction but needs more work to reduce fall risk.  Pt continues to benefit from skilled care.    OBJECTIVE IMPAIRMENTS Abnormal gait, cardiopulmonary status limiting activity, decreased activity tolerance, decreased balance, decreased endurance, decreased knowledge of condition, decreased knowledge of use of DME, decreased mobility, difficulty walking, decreased strength, increased edema, postural dysfunction, prosthetic dependency , obesity, pain, and skin integrity issues on residual limb .    ACTIVITY LIMITATIONS carrying, lifting, bending, sitting, standing, squatting, stairs, transfers, locomotion level, and safe prosthesis use   PARTICIPATION LIMITATIONS: driving, community activity, and recreational activities   [place], Time since onset of injury/illness/exacerbation, and 3+ comorbidities:  see PMH  are also affecting patient's functional outcome.    REHAB POTENTIAL: Good   CLINICAL DECISION MAKING: Evolving/moderate complexity   EVALUATION COMPLEXITY: Moderate     GOALS: Goals reviewed with patient? Yes   SHORT TERM GOALS: Target date: 08/27/2022   Patient verbalizes adjusting ply socks & sweat management.  Baseline: SEE OBJECTIVE DATA Goal status: MET 08/26/2022 2.  Patient tolerates prosthesis >12 hrs total /day with no skin issues or limb pain after standing. Baseline: SEE OBJECTIVE DATA Goal status:  MET 08/26/2022  3.  Berg Balance Test >/= 36/56 Baseline: SEE OBJECTIVE DATA Goal status: MET 08/26/2022   4. Patient ambulates 200' with cane & prosthesis with supervision. Baseline: SEE OBJECTIVE DATA Goal status: MET 08/26/2022   5. Patient negotiates  ramps & curbs with cane & prosthesis with minA. Baseline: SEE OBJECTIVE DATA Goal status:  MET 08/26/2022     LONG TERM GOALS: Target date: 09/24/2022   Patient demonstrates & verbalized understanding of prosthetic care to enable safe utilization of prosthesis. Baseline: SEE OBJECTIVE DATA Goal status: ongoing 08/26/2022   Patient tolerates prosthesis wear >90% of awake hours without skin or limb pain issues. Baseline: SEE OBJECTIVE DATA Goal status: ongoing 08/26/2022   Berg Balance > 48/56  to indicate lower fall risk Baseline: SEE OBJECTIVE DATA Goal status: ongoing 08/26/2022   Patient ambulates >500' with prosthesis & cane or less independently Baseline: SEE OBJECTIVE DATA Goal status: ongoing 08/26/2022   Patient negotiates ramps, curbs & stairs with single rail with prosthesis cane or less independently. Baseline: SEE OBJECTIVE DATA Goal status: ongoing 08/26/2022   Patient demonstrates & verbalizes climbing ladder, lifting, carrying, pushing, pulling with prosthesis safely. Baseline: SEE OBJECTIVE DATA Goal status: ongoing 08/26/2022   Patient verbalizes & demonstrates understanding  of fishing simulations.  Baseline: SEE OBJECTIVE DATA Goal status: ongoing 08/26/2022   PLAN: PT FREQUENCY: 2x/week   PT DURATION: 90 days / 13 weeks   PLANNED INTERVENTIONS: Therapeutic exercises, Therapeutic activity, Neuromuscular re-education, Balance training, Gait training, Patient/Family education, Stair training, Vestibular training, Prosthetic training, DME instructions, and Physical Performance Testing   PLAN FOR NEXT SESSION:  work on standing balance activities.   work towards [employer].   Jamey Reas, PT, DPT 09/14/2022, 5:29 PM

## 2022-09-15 ENCOUNTER — Encounter: Payer: Medicare Other | Admitting: Physical Therapy

## 2022-09-15 NOTE — Therapy (Incomplete)
OUTPATIENT PHYSICAL THERAPY TREATMENT NOTE  Patient Name: Eduardo [name] MRN: [id number] DOB:07-03-1956, 66 y.o., male Today's Date: 09/15/2022  PCP: Rita Ohara, MD REFERRING PROVIDER: Newt Minion, MD      END OF SESSION:              Past Medical History:  Diagnosis Date   CHF (congestive heart failure) ([place])    Chronic kidney disease    stage 3   Colon polyp    Coronary artery disease    Diabetes mellitus 1987   under care of Dr. Chalmers Cater.  On insulin since 96 (off and on)   Diabetic retinopathy    Dupuytren contracture    R hand, s/p injection (Dr. Lenon Curt)   Essential hypertension, benign    Essential hypertension, benign 02/06/2019   Frequency of urination and polyuria    Hypertension    Myocardial infarction [facility])    denies   Neuromuscular disorder ([place])    Diabetic neuropathy   Osteomyelitis ([place])    right foot   Other testicular hypofunction    Peripheral arterial disease ([place]) 10/28/2012   Peritoneal abscess ([place]) 6/08   and buttock.   Pneumonia    Polydipsia    Proteinuria    Pure hyperglyceridemia    Subacute osteomyelitis, right ankle and foot ([place])    Wears glasses    Past Surgical History:  Procedure Laterality Date   ABDOMINAL AORTAGRAM N/A 04/18/2012   Procedure: ABDOMINAL Maxcine Ham;  Surgeon: Angelia Mould, MD;  Location: [facility] CATH LAB;  Service: Cardiovascular;  Laterality: N/A;   AMPUTATION Right 05/19/2019   Procedure: RIGHT FOOT 5TH RAY AMPUTATION;  Surgeon: Newt Minion, MD;  Location: [place];  Service: Orthopedics;  Laterality: Right;   AMPUTATION Right 03/11/2022   Procedure: RIGHT LEG DEBRIDEMENT VS. BELOW KNEE AMPUTATION;  Surgeon: Newt Minion, MD;  Location: [place];  Service: Orthopedics;  Laterality: Right;   CARDIAC CATHETERIZATION N/A 09/08/2016   Procedure: Left Heart Cath and Coronary Angiography;  Surgeon: Adrian Prows, MD;  Location: [place] CV LAB;  Service: Cardiovascular;  Laterality: N/A;   CATARACT EXTRACTION,  BILATERAL  09/2017, 10/2017   Dr. Herbert Deaner   COLONOSCOPY W/ BIOPSIES AND POLYPECTOMY     CORONARY/GRAFT ACUTE MI REVASCULARIZATION N/A 10/11/2020   Procedure: Coronary/Graft Acute MI Revascularization;  Surgeon: Adrian Prows, MD;  Location: [place] CV LAB;  Service: Cardiovascular;  Laterality: N/A;   I & D EXTREMITY Right 03/11/2022   Procedure: BELOW KNEE AMPUTATION;  Surgeon: Newt Minion, MD;  Location: [place];  Service: Orthopedics;  Laterality: Right;   LEFT HEART CATH N/A 12/31/2020   Procedure: Left Heart Cath;  Surgeon: Adrian Prows, MD;  Location: [place] CV LAB;  Service: Cardiovascular;  Laterality: N/A;   LEFT HEART CATH AND CORONARY ANGIOGRAPHY N/A 10/11/2020   Procedure: LEFT HEART CATH AND CORONARY ANGIOGRAPHY;  Surgeon: Adrian Prows, MD;  Location: [place] CV LAB;  Service: Cardiovascular;  Laterality: N/A;   LOWER EXTREMITY ANGIOGRAM Bilateral 04/18/2012   Procedure: LOWER EXTREMITY ANGIOGRAM;  Surgeon: Angelia Mould, MD;  Location: [facility] CATH LAB;  Service: Cardiovascular;  Laterality: Bilateral;  bilat lower extrem angio   LOWER EXTREMITY ANGIOGRAPHY Bilateral 05/02/2019   Procedure: LOWER EXTREMITY ANGIOGRAPHY;  Surgeon: Adrian Prows, MD;  Location: [place] CV LAB;  Service: Cardiovascular;  Laterality: Bilateral;   LOWER EXTREMITY ANGIOGRAPHY Bilateral 02/28/2019   Procedure: LOWER EXTREMITY ANGIOGRAPHY;  Surgeon: Adrian Prows, MD;  Location: [place] CV LAB;  Service: Cardiovascular;  Laterality: Bilateral;   macular photocoagulation     (eye treatments for diabetic retinopathy)-Dr. Zigmund Daniel   PERIPHERAL VASCULAR INTERVENTION  02/28/2019   Procedure: PERIPHERAL VASCULAR INTERVENTION;  Surgeon: Adrian Prows, MD;  Location: [place] CV LAB;  Service: Cardiovascular;;   TEE WITHOUT CARDIOVERSION N/A 03/18/2022   Procedure: TRANSESOPHAGEAL ECHOCARDIOGRAM (TEE);  Surgeon: Adrian Prows, MD;  Location: [facility] ENDOSCOPY;  Service: Cardiovascular;  Laterality: N/A;   VENTRICULAR  ASSIST DEVICE INSERTION N/A 12/31/2020   Procedure: VENTRICULAR ASSIST DEVICE INSERTION;  Surgeon: Adrian Prows, MD;  Location: [place] CV LAB;  Service: Cardiovascular;  Laterality: N/A;   Patient Active Problem List   Diagnosis Date Noted   LBBB (left bundle branch block) 60/08/9322   Chronic systolic heart failure ([place]) 08/19/2022   Chronic right-sided congestive heart failure ([place]) 05/18/2022   Ejection fraction < 50% 04/17/2022   Insomnia    Constipation    S/P BKA (below knee amputation), right ([place]) 03/21/2022   Ischemic cardiomyopathy    AKI (acute kidney injury) ([place]) 03/13/2022   Streptococcal bacteremia 03/13/2022   Hypophosphatemia 03/13/2022   Septic shock ([place]) 55/73/2202   Acute systolic heart failure ([place]) 12/28/2020   Acute pulmonary edema ([place]) 10/11/2020   Non-ST elevation (NSTEMI) myocardial infarction ([place]) 10/11/2020   Acute respiratory distress 10/11/2020   Acute on chronic combined systolic and diastolic CHF (congestive heart failure) ([place]) 10/11/2020   Cardiogenic shock (HCC)    Partial nontraumatic amputation of foot, right ([place]) 06/21/2019   Osteomyelitis of right foot ([place])    Essential hypertension, benign 02/06/2019   Hypercholesteremia 02/06/2019   Stage 3b chronic kidney disease (CKD) ([place]) 10/06/2017   Proteinuria 10/06/2017   Controlled type 2 diabetes mellitus with hyperglycemia, with long-term current use of insulin ([place]) 10/06/2017   Coronary artery disease involving native coronary artery of native heart without angina pectoris 05/23/2017   Abnormal nuclear stress test 09/06/2016   PAD (peripheral artery disease) ([place]) 10/28/2012   Morbid obesity with BMI of 40.0-44.9, adult ([place]) 10/13/2012   Diabetes mellitus with ophthalmic complication ([place]) 54/27/0623   Hypertension associated with diabetes ([place]) 10/13/2012   Atherosclerosis of native artery of extremity with intermittent claudication ([place]) 04/05/2012   Adenomatous colon polyp  09/28/2011   Diabetes mellitus ([place]) 09/28/2011   Pure hyperglyceridemia 09/28/2011   Erectile dysfunction 09/28/2011   Diabetic retinopathy ([place]) 09/28/2011    REFERRING DIAG: Z89.511 (ICD-10-CM) - S/P below knee amputation, right  ONSET DATE: 06/25/2022 prosthesis delivery  THERAPY DIAG:  No diagnosis found.  Rationale for Evaluation and Treatment Rehabilitation  PERTINENT HISTORY: combined systolic & diastolic CHF/cardiomyopathy EF 15-20%, DM2, PVD/PAD, CAD, CABG 2017, HTN, HLD, morbid obesity, CKD st3b, diabetic retinopathy  PRECAUTIONS: None  SUBJECTIVE:  *** He saw got an intestinal virus [place].  He is still wearing prosthesis most of awake hours but was less active.  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 He used some of PT recommendations with traveling which helped.   PAIN:  Are you having pain?   0/10 today  OBJECTIVE: (objective measures completed at initial evaluation unless otherwise dated)  OBJECTIVE:    COGNITION: 06/30/2022  Overall cognitive status: Within functional limits for tasks assessed   POSTURE: 06/30/2022  rounded shoulders, forward head, and weight shift left   LOWER EXTREMITY ROM:   ROM P:passive  A:active Right Eval 06/30/2022 Left eval  Hip flexion      Hip extension      Hip abduction  Hip adduction      Hip internal rotation      Hip external rotation      Knee flexion      Knee extension 0*    Ankle dorsiflexion      Ankle plantarflexion      Ankle inversion      Ankle eversion       (Blank rows = not tested)   LOWER EXTREMITY MMT:   MMT Right Eval 06/30/2022 Left eval  Hip flexion      Hip extension 4/5    Hip abduction 4/5    Hip adduction      Hip internal rotation      Hip external rotation      Knee flexion 4/5    Knee extension 4/5    Ankle dorsiflexion NA    Ankle plantarflexion NA    Ankle inversion NA    Ankle eversion NA    Evaluation testing functionally (Blank rows = not tested)    TRANSFERS: 06/30/2022  Sit to stand: SBA from chairs with armrests using back of LLE against chair to stabilize 06/30/2022   Stand to sit:  SBA using UEs to control descent   GAIT: 07/20/2022 Pt ambulates with step-through pattern with RW and Rt medial whip  06/30/2022 Gait pattern: step to pattern, decreased step length- Left, decreased stance time- Right, decreased stride length, decreased hip/knee flexion- Right, Right hip hike, antalgic, and abducted- Right Distance walked: 100' Assistive device utilized: Environmental consultant - 2 wheeled and TTA prosthesis Level of assistance: SBA  needs cues for gait deviations / no assist physically with RW support Comments: excessive UE weight bearing on RW to limit weight on prosthesis.    FUNCTIONAL TESTs:  Berg Balance Scale: 08/26/2022  45/56  Berg Balance Scale: 06/30/2022:   20/56    CURRENT PROSTHETIC WEAR ASSESSMENT: 06/30/2022 Patient is dependent with: skin check, residual limb care, prosthetic cleaning, ply sock cleaning, correct ply sock adjustment, proper wear schedule/adjustment, and proper weight-bearing schedule/adjustment Donning prosthesis: SBA Doffing prosthesis: SBA Prosthetic wear tolerance: 2 hours, 2x/day, 5 days since delivery Prosthetic weight bearing tolerance: 5 minutes with residual limb pain 3-4/10 Edema: pitting Residual limb condition:  scab medially 30m and dry scab lateral scar 320m  Good hair growth, normal color & temperature.  Cylindrical shape Prosthetic description: silicon liner with pin lock suspension, total contact socket, dynamic response foot K code/activity level with prosthetic use: Level 3     TODAY'S TREATMENT:  09/15/2022 ***  09/14/2022 Prosthetic Training with TTA prosthesis: Pt's residual limb has no open areas and skin has no changes noting concern.  PT instructed in donning socks & pt return demo.   Neuromuscular re-education: Standing crossways on foam beam to facilitate ankle/residual limb & hip  strategies for balance: -static eyes open -static eyes closed only up to 3 sec -eyes open head movements 4 directions with intermittent touch //bars & PT tactile cues, visual cues for upright with mirror. Step strategy anticipatory stepping off foam beam forward, backward and laterally right/left. He required touch on //bars and >1 step most of time to catch balance but improved esp with LLE (non-amputated LE). PT demo & verbal cues how to work on step strategy in corner. Pt verbalized understanding.    08/26/2022 Prosthetic Training with TTA prosthesis PT recommended increasing wear to all awake hours checking for sweat every 3-4 hours. He may need to limit weight bearing if distal tibia is hurting.  He can limit by time or  assistive devices that give more UE support.  Pt verbalized understanding. Pt is planning 4-day vacation to [place] leaving this weekend.  PT recommended making & using a packing list for prosthetic items to ensure that he does not forget anything. PT used internet to determine his fishing chair which he reports he can sit/stand even on grass in his yard.  Discussed 3-legged fishing chair as option for shower chair & possible "water leg" into the future.  PT demo & verbal cues on negotiating steeper inclines with sidestepping or transversing on diagonal. Pt return demo & verbalized understanding with cane.  Pt amb 200' with cane safely.  Berg Balance 45/56 see objective   HOME EXERCISE PROGRAM:  Access Code: [email or web address] URL: [email or web address] Date: 07/20/2022 Prepared by: Jamey Reas  Exercises - Seated Quad Set  - 3-5 x daily - 7 x weekly - 2-3 sets - 10-20 reps - 5 seconds hold - Straight Leg Raise with External Rotation  - 3-5 x daily - 7 x weekly - 2-3 sets - 10-20 reps - 5 seconds hold   Do each exercise 1-2  times per day Do each exercise 5-10 repetitions Hold each exercise for 2 seconds to feel your location  AT [place].  Try to find this position when standing still for activities.   USE TAPE ON FLOOR TO MARK THE MIDLINE POSITION which is even with middle of sink.  You also should try to feel with your limb pressure in socket.  You are trying to feel with limb what you used to feel with the bottom of your foot.  Side to Side Shift: Moving your hips only (not shoulders): move weight onto your left leg, HOLD/FEEL pressure in socket.  Move back to equal weight on each leg, HOLD/FEEL pressure in socket. Move weight onto your right leg, HOLD/FEEL pressure in socket. Move back to equal weight on each leg, HOLD/FEEL pressure in socket. Repeat.  Start with both hands on sink, progress to hand on prosthetic side only, then no hands.  Front to Back Shift: Moving your hips only (not shoulders): move your weight forward onto your toes, HOLD/FEEL pressure in socket. Move your weight back to equal Flat Foot on both legs, HOLD/FEEL  pressure in socket. Move your weight back onto your heels, HOLD/FEEL  pressure in socket. Move your weight back to equal on both legs, HOLD/FEEL  pressure in socket. Repeat.  Start with both hands on sink, progress to hand on prosthetic side only, then no hands.  Moving Cones / Cups: With equal weight on each leg: Hold on with one hand the first time, then progress to no hand supports. Move cups from one side of sink to the other. Place cups ~2" out of your reach, progress to 10" beyond reach.  Place one hand in middle of sink and reach with other hand. Do both arms.  Then hover one hand and move cups with other hand.  Overhead/Upward Reaching: alternated reaching up to top cabinets or ceiling if no cabinets present. Keep equal weight on each leg. Start with one hand support on counter while other hand reaches and progress to no hand support with reaching.  ace one hand in middle of sink and reach with other hand. Do both arms.  Then hover one hand and  move cups with other hand.  5.   Looking Over Shoulders: With equal weight on each leg: alternate turning to look  over your shoulders with one hand support on counter as needed.  Start with head motions only to look in front of shoulder, then even with shoulder and progress to looking behind you. To look to side, move head /eyes, then shoulder on side looking pulls back, shift more weight to side looking and pull hip back. Place one hand in middle of sink and let go with other hand so your shoulder can pull back. Switch hands to look other way.   Then hover one hand and look over shoulder. If looking right, use left hand at sink. If looking left, use right hand at sink. 6.  Stepping with leg that is not amputated:  Move items under cabinet out of your way. Shift your hips/pelvis so weight on prosthesis. Tighten muscles in hip on prosthetic side.  SLOWLY step other leg so front of foot is in cabinet. Then step back to floor.       ASSESSMENT: CLINICAL IMPRESSION: ***  PT worked on balance activities to facilitate ankle/residual limb, hip & step strategies. He improved with instruction but needs more work to reduce fall risk.  Pt continues to benefit from skilled care.    OBJECTIVE IMPAIRMENTS Abnormal gait, cardiopulmonary status limiting activity, decreased activity tolerance, decreased balance, decreased endurance, decreased knowledge of condition, decreased knowledge of use of DME, decreased mobility, difficulty walking, decreased strength, increased edema, postural dysfunction, prosthetic dependency , obesity, pain, and skin integrity issues on residual limb .    ACTIVITY LIMITATIONS carrying, lifting, bending, sitting, standing, squatting, stairs, transfers, locomotion level, and safe prosthesis use   PARTICIPATION LIMITATIONS: driving, community activity, and recreational activities   Gravity, Time since onset of injury/illness/exacerbation, and 3+ comorbidities: see PMH  are  also affecting patient's functional outcome.    REHAB POTENTIAL: Good   CLINICAL DECISION MAKING: Evolving/moderate complexity   EVALUATION COMPLEXITY: Moderate     GOALS: Goals reviewed with patient? Yes   SHORT TERM GOALS: Target date: 08/27/2022   Patient verbalizes adjusting ply socks & sweat management.  Baseline: SEE OBJECTIVE DATA Goal status: MET 08/26/2022 2.  Patient tolerates prosthesis >12 hrs total /day with no skin issues or limb pain after standing. Baseline: SEE OBJECTIVE DATA Goal status:  MET 08/26/2022  3.  Berg Balance Test >/= 36/56 Baseline: SEE OBJECTIVE DATA Goal status: MET 08/26/2022   4. Patient ambulates 200' with cane & prosthesis with supervision. Baseline: SEE OBJECTIVE DATA Goal status: MET 08/26/2022   5. Patient negotiates ramps & curbs with cane & prosthesis with minA. Baseline: SEE OBJECTIVE DATA Goal status:  MET 08/26/2022     LONG TERM GOALS: Target date: 09/24/2022   Patient demonstrates & verbalized understanding of prosthetic care to enable safe utilization of prosthesis. Baseline: SEE OBJECTIVE DATA Goal status: ongoing 08/26/2022   Patient tolerates prosthesis wear >90% of awake hours without skin or limb pain issues. Baseline: SEE OBJECTIVE DATA Goal status: ongoing 08/26/2022   Berg Balance > 48/56  to indicate lower fall risk Baseline: SEE OBJECTIVE DATA Goal status: ongoing 08/26/2022   Patient ambulates >500' with prosthesis & cane or less independently Baseline: SEE OBJECTIVE DATA Goal status: ongoing 08/26/2022   Patient negotiates ramps, curbs & stairs with single rail with prosthesis cane or less independently. Baseline: SEE OBJECTIVE DATA Goal status: ongoing 08/26/2022   Patient demonstrates & verbalizes climbing ladder, lifting, carrying, pushing, pulling with prosthesis safely. Baseline: SEE OBJECTIVE DATA Goal status: ongoing 08/26/2022   Patient verbalizes & demonstrates understanding of fishing  simulations.  Baseline: SEE OBJECTIVE DATA Goal status: ongoing 08/26/2022   PLAN: PT FREQUENCY: 2x/week   PT DURATION: 90 days / 13 weeks   PLANNED INTERVENTIONS: Therapeutic exercises, Therapeutic activity, Neuromuscular re-education, Balance training, Gait training, Patient/Family education, Stair training, Vestibular training, Prosthetic training, DME instructions, and Physical Performance Testing   PLAN FOR NEXT SESSION:  *** work on standing balance activities.   work towards [employer].   Jamey Reas, PT, DPT 09/15/2022, 8:11 AM

## 2022-09-16 ENCOUNTER — Encounter: Payer: Self-pay | Admitting: *Deleted

## 2022-09-16 ENCOUNTER — Encounter: Payer: Self-pay | Admitting: Family Medicine

## 2022-09-18 ENCOUNTER — Encounter
Payer: Medicare Other | Attending: Physical Medicine and Rehabilitation | Admitting: Physical Medicine and Rehabilitation

## 2022-09-18 ENCOUNTER — Encounter: Payer: Self-pay | Admitting: Physical Medicine and Rehabilitation

## 2022-09-18 VITALS — BP 89/55 | HR 75 | Ht 70.0 in | Wt 244.0 lb

## 2022-09-18 DIAGNOSIS — G8928 Other chronic postprocedural pain: Secondary | ICD-10-CM | POA: Diagnosis not present

## 2022-09-18 DIAGNOSIS — G546 Phantom limb syndrome with pain: Secondary | ICD-10-CM | POA: Insufficient documentation

## 2022-09-18 DIAGNOSIS — R269 Unspecified abnormalities of gait and mobility: Secondary | ICD-10-CM | POA: Insufficient documentation

## 2022-09-18 DIAGNOSIS — Z89511 Acquired absence of right leg below knee: Secondary | ICD-10-CM | POA: Insufficient documentation

## 2022-09-18 DIAGNOSIS — E1165 Type 2 diabetes mellitus with hyperglycemia: Secondary | ICD-10-CM | POA: Diagnosis not present

## 2022-09-18 MED ORDER — TRAMADOL HCL 50 MG PO TABS: 50.0000 mg | ORAL_TABLET | Freq: Four times a day (QID) | ORAL | 0 refills | Status: DC | PRN

## 2022-09-18 NOTE — Patient Instructions (Signed)
Pt is a 66 yr old male with recent R BKA in May 2023; , also has hx of dCHF, DM; on AC, PAD; AC held due ot pec tear;  Here for f/u on R BKA Will need new sleeve- as soon as insurance will allow- esp due to anterior tibia and so many plys of socks.   I think phantom pain that's worse is due to nerve, as swelling has decreased, it's uncovered that nerve has fired more causing phantom pain.   2. Chronic post surgical pain-  When gets tibial pain- very difficult to wear prosthesis, hard to walk- and needs to take prosthesis off. The pain is the signal that NEEDS to take prosthesis. Wait on pain meds.   3. At f/u appointment in February, will need to get vacuum BKA prosthesis- will need to justify- at next appointment.   4.  F/U in  3 months.   5. Tramadol- will try for 1 week and if needs more, will need UDS and opiate contact- 50- mg q6 hours prn #28- no refills.

## 2022-09-18 NOTE — Progress Notes (Signed)
Subjective:    Patient ID: Eduardo [name], male    DOB: 20-Aug-1956, 66 y.o.   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  And until fills in;   Using shuttle lock with pin But talking about vacuum seal for next prosthesis.  And might pad with leather as well where tibial protuberance is occurring.    Walking with cane and in house, no AD  Takes rollator when walks a long distance- ~ 1/2-1 mile is a long distance.  Tried to walk around outside the house every day usually.  Takes scooter in [place], food store.  And tried once in [employer] to walk with cart- but had ot leave after 20 minutes.   Lately, getting phantom pain-  in last last 2 weeks- occurring more- Tuesday couldn't wear prosthesis at all- got real uncomfortable after wearing 10 to 15 minutes. Didn't eat salt, but was real swollen- only used 0-5 ply on Tuesday.  Back up to 10-13 ply the rest of week. Which is normal for him.   Today wearing 10 ply- and will go to 13 plies before end of day.  Will definitely need  new prosthesis and sleeve at early as February.   Will use Aspercreme which will help residual limb aching- using the last 2 days at night.  Tried tylenol- but tried aspercreme. Relieves pain pretty quickly.  Phantom pain- understands why it occurs- but "doesn't have a limb to hurt!!".   Now having phantom pain MORE than did before. Was 0-1/10- but now 3-4/10 at worst when it occurs.     Pain Inventory Average Pain 1 Pain Right Now 0 My pain is intermittent and sharp  In the last 24 hours, has pain interfered with the following? General activity 0 Relation with others 0 Enjoyment of life 0 What TIME of day is your pain at its worst? varies Sleep (in general) Good  Pain is worse with:  walking Pain improves with: rest Relief from Meds:  na  Family History  Problem Relation Age of Onset   Diabetes Mother    Hearing loss Mother    Hypertension Mother    Hyperlipidemia Mother    Heart disease Mother    Varicose Veins Mother    Varicose Veins Father    Dementia Father    Hyperlipidemia Brother    Diabetes Maternal Grandmother    Social History   Socioeconomic History   Marital status: Widowed    Spouse name: Not on file   Number of children: 0   Years of education: Not on file   Highest education level: Not on file  Occupational History   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 Retired. No pets. Enjoys fishing.   Updated 06/2021   Social Determinants of Health   Financial Resource Strain: Not on file  Food Insecurity: Not on file  Transportation Needs: Not on file  Physical Activity: Not on file  Stress: Not on file  Social Connections: Not on file   Past Surgical History:  Procedure Laterality Date   ABDOMINAL AORTAGRAM N/A 04/18/2012   Procedure: ABDOMINAL Maxcine Ham;  Surgeon: Angelia Mould, MD;  Location: [facility] CATH LAB;  Service: Cardiovascular;  Laterality: N/A;   AMPUTATION Right 05/19/2019   Procedure: RIGHT FOOT 5TH RAY AMPUTATION;  Surgeon: Newt Minion, MD;  Location: [place];  Service: Orthopedics;  Laterality: Right;   AMPUTATION Right 03/11/2022   Procedure: RIGHT LEG DEBRIDEMENT VS. BELOW  KNEE AMPUTATION;  Surgeon: Newt Minion, MD;  Location: [place];  Service: Orthopedics;  Laterality: Right;   CARDIAC CATHETERIZATION N/A 09/08/2016   Procedure: Left Heart Cath and Coronary Angiography;  Surgeon: Adrian Prows, MD;  Location: [place] CV LAB;  Service: Cardiovascular;  Laterality: N/A;   CATARACT EXTRACTION, BILATERAL  09/2017, 10/2017   Dr. Herbert Deaner   COLONOSCOPY W/ BIOPSIES AND POLYPECTOMY     CORONARY/GRAFT ACUTE MI REVASCULARIZATION N/A 10/11/2020   Procedure: Coronary/Graft Acute MI Revascularization;  Surgeon: Adrian Prows, MD;  Location: [place] CV LAB;  Service: Cardiovascular;  Laterality: N/A;   I & D EXTREMITY Right 03/11/2022   Procedure: BELOW KNEE AMPUTATION;  Surgeon: Newt Minion, MD;  Location: [place];  Service: Orthopedics;  Laterality: Right;   LEFT HEART CATH N/A 12/31/2020   Procedure: Left Heart Cath;  Surgeon: Adrian Prows, MD;  Location: [place] CV LAB;  Service: Cardiovascular;  Laterality: N/A;   LEFT HEART CATH AND CORONARY ANGIOGRAPHY N/A 10/11/2020   Procedure: LEFT HEART CATH AND CORONARY ANGIOGRAPHY;  Surgeon: Adrian Prows, MD;  Location: [place] CV LAB;  Service: Cardiovascular;  Laterality: N/A;   LOWER EXTREMITY ANGIOGRAM Bilateral 04/18/2012   Procedure: LOWER EXTREMITY ANGIOGRAM;  Surgeon: Angelia Mould, MD;  Location: [facility] CATH LAB;  Service: Cardiovascular;  Laterality: Bilateral;  bilat lower extrem angio   LOWER EXTREMITY ANGIOGRAPHY Bilateral 05/02/2019   Procedure: LOWER EXTREMITY ANGIOGRAPHY;  Surgeon: Adrian Prows, MD;  Location: [place] CV LAB;  Service: Cardiovascular;  Laterality: Bilateral;   LOWER EXTREMITY ANGIOGRAPHY Bilateral 02/28/2019   Procedure: LOWER EXTREMITY ANGIOGRAPHY;  Surgeon: Adrian Prows, MD;  Location: [place] CV LAB;  Service: Cardiovascular;  Laterality: Bilateral;   macular photocoagulation     (eye treatments for diabetic retinopathy)-Dr. Zigmund Daniel   PERIPHERAL VASCULAR INTERVENTION  02/28/2019    Procedure: PERIPHERAL VASCULAR INTERVENTION;  Surgeon: Adrian Prows, MD;  Location: [place] CV LAB;  Service: Cardiovascular;;   TEE WITHOUT CARDIOVERSION N/A 03/18/2022   Procedure: TRANSESOPHAGEAL ECHOCARDIOGRAM (TEE);  Surgeon: Adrian Prows, MD;  Location: [facility] ENDOSCOPY;  Service: Cardiovascular;  Laterality: N/A;   VENTRICULAR ASSIST DEVICE INSERTION N/A 12/31/2020   Procedure: VENTRICULAR ASSIST DEVICE INSERTION;  Surgeon: Adrian Prows, MD;  Location: [place] CV LAB;  Service: Cardiovascular;  Laterality: N/A;   Past Surgical History:  Procedure Laterality Date   ABDOMINAL AORTAGRAM N/A 04/18/2012   Procedure: ABDOMINAL AORTAGRAM;  Surgeon: Angelia Mould, MD;  Location: [facility] CATH LAB;  Service: Cardiovascular;  Laterality: N/A;   AMPUTATION Right  05/19/2019   Procedure: RIGHT FOOT 5TH RAY AMPUTATION;  Surgeon: Newt Minion, MD;  Location: [place];  Service: Orthopedics;  Laterality: Right;   AMPUTATION Right 03/11/2022   Procedure: RIGHT LEG DEBRIDEMENT VS. BELOW KNEE AMPUTATION;  Surgeon: Newt Minion, MD;  Location: [place];  Service: Orthopedics;  Laterality: Right;   CARDIAC CATHETERIZATION N/A 09/08/2016   Procedure: Left Heart Cath and Coronary Angiography;  Surgeon: Adrian Prows, MD;  Location: [place] CV LAB;  Service: Cardiovascular;  Laterality: N/A;   CATARACT EXTRACTION, BILATERAL  09/2017, 10/2017   Dr. Herbert Deaner   COLONOSCOPY W/ BIOPSIES AND POLYPECTOMY     CORONARY/GRAFT ACUTE MI REVASCULARIZATION N/A 10/11/2020   Procedure: Coronary/Graft Acute MI Revascularization;  Surgeon: Adrian Prows, MD;  Location: [place] CV LAB;  Service: Cardiovascular;  Laterality: N/A;   I & D EXTREMITY Right 03/11/2022   Procedure: BELOW KNEE AMPUTATION;  Surgeon: Newt Minion, MD;  Location: [place];  Service: Orthopedics;  Laterality: Right;   LEFT HEART CATH N/A 12/31/2020   Procedure: Left Heart Cath;  Surgeon: Adrian Prows, MD;  Location: [place] CV LAB;  Service: Cardiovascular;   Laterality: N/A;   LEFT HEART CATH AND CORONARY ANGIOGRAPHY N/A 10/11/2020   Procedure: LEFT HEART CATH AND CORONARY ANGIOGRAPHY;  Surgeon: Adrian Prows, MD;  Location: [place] CV LAB;  Service: Cardiovascular;  Laterality: N/A;   LOWER EXTREMITY ANGIOGRAM Bilateral 04/18/2012   Procedure: LOWER EXTREMITY ANGIOGRAM;  Surgeon: Angelia Mould, MD;  Location: [facility] CATH LAB;  Service: Cardiovascular;  Laterality: Bilateral;  bilat lower extrem angio   LOWER EXTREMITY ANGIOGRAPHY Bilateral 05/02/2019   Procedure: LOWER EXTREMITY ANGIOGRAPHY;  Surgeon: Adrian Prows, MD;  Location: [place] CV LAB;  Service: Cardiovascular;  Laterality: Bilateral;   LOWER EXTREMITY ANGIOGRAPHY Bilateral 02/28/2019   Procedure: LOWER EXTREMITY ANGIOGRAPHY;  Surgeon: Adrian Prows, MD;  Location: [place] CV LAB;  Service: Cardiovascular;  Laterality: Bilateral;   macular photocoagulation     (eye treatments for diabetic retinopathy)-Dr. Zigmund Daniel   PERIPHERAL VASCULAR INTERVENTION  02/28/2019   Procedure: PERIPHERAL VASCULAR INTERVENTION;  Surgeon: Adrian Prows, MD;  Location: [place] CV LAB;  Service: Cardiovascular;;   TEE WITHOUT CARDIOVERSION N/A 03/18/2022   Procedure: TRANSESOPHAGEAL ECHOCARDIOGRAM (TEE);  Surgeon: Adrian Prows, MD;  Location: [facility] ENDOSCOPY;  Service: Cardiovascular;  Laterality: N/A;   VENTRICULAR ASSIST DEVICE INSERTION N/A 12/31/2020   Procedure: VENTRICULAR ASSIST DEVICE INSERTION;  Surgeon: Adrian Prows, MD;  Location: [place] CV LAB;  Service: Cardiovascular;  Laterality: N/A;   Past Medical History:  Diagnosis Date   CHF (congestive heart failure) ([place])    Chronic kidney disease    stage 3   Colon polyp    Coronary artery disease    Diabetes mellitus 1987   under care of Dr. Chalmers Cater.  On insulin since 96 (off and on)   Diabetic retinopathy    Dupuytren contracture    R hand, s/p injection (Dr. Lenon Curt)   Essential hypertension, benign    Essential hypertension, benign 02/06/2019    Frequency of urination and polyuria    Hypertension    Myocardial infarction [facility])    denies   Neuromuscular disorder ([place])    Diabetic neuropathy   Osteomyelitis ([place])    right foot   Other testicular hypofunction    Peripheral arterial disease ([place]) 10/28/2012   Peritoneal abscess ([place]) 6/08   and buttock.   Pneumonia    Polydipsia    Proteinuria    Pure  hyperglyceridemia    Subacute osteomyelitis, right ankle and foot (H[facility])    Wears glasses    BP (!) 89/55   Pulse 75   Ht '5\' 10"'$  (1.778 m)   Wt 244 lb (110.7 kg)   SpO2 97%   BMI 35.01 kg/m   Opioid Risk Score:   Fall Risk Score:  `1  Depression screen [facility] 2/9     07/06/2022    9:42 AM 05/18/2022   11:28 AM 06/26/2021    8:45 AM 06/24/2020    8:40 AM 06/12/2019    8:52 AM 06/06/2018    8:47 AM 05/25/2017   10:03 AM  Depression screen [facility] 2/9  Decreased Interest 0 0 0 0 0 0 0  Down, Depressed, Hopeless 0 0 0 0 0 0 0  PHQ - 2 Score 0 0 0 0 0 0 0  Altered sleeping  0       Tired, decreased energy  0       Change in appetite  0       Feeling bad or failure about yourself   0       Trouble concentrating  0       Moving slowly or fidgety/restless  0       Suicidal thoughts  0       PHQ-9 Score  0         Review of Systems  Musculoskeletal:        Tibia pain  All other systems reviewed and are negative.     Objective:   Physical Exam  Awake, alert, appropriate,  Some padding at tip of front/tibia at tip in prosthesis Has a very protuberant tip of tibia- a trace/little blanchable redness over this protuberance. Tibia very anterior in residual limb.   * has fish on Sleeve of BKA prosthesis. * Gait- walks with single point cane-  Per pt got BERG 45/56 2 weeks ago.  Deliberate heel to toe on R- with 4 point to turn around- overall great gait with cane, being used correctly.        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   I think phantom pain that's worse is due to nerve, as swelling has decreased, it's uncovered that nerve has fired more causing phantom pain.   2. Chronic post surgical pain-  When gets tibial pain- very difficult to wear prosthesis, hard to walk- and needs to take prosthesis off. The pain is the signal that NEEDS to take prosthesis. Wait on pain meds.   3. At f/u appointment in February, will need to get vacuum BKA prosthesis- will need to justify- at next appointment.   4.  F/U in  3 months.   5. Tramadol- will try for 1 week and if needs more, will need UDS and opiate contact- 50- mg q6 hours prn #28- no refills.

## 2022-09-22 ENCOUNTER — Encounter: Payer: Medicare Other | Admitting: Physical Therapy

## 2022-09-23 ENCOUNTER — Encounter: Payer: Medicare Other | Admitting: Physical Therapy

## 2022-09-23 NOTE — Therapy (Incomplete)
OUTPATIENT PHYSICAL THERAPY TREATMENT NOTE  Patient Name: Eduardo [name] MRN: [id number] DOB:Aug 27, 1956, 66 y.o., male Today's Date: 09/23/2022  PCP: Rita Ohara, MD REFERRING PROVIDER: Newt Minion, MD      END OF SESSION:              Past Medical History:  Diagnosis Date   CHF (congestive heart failure) ([place])    Chronic kidney disease    stage 3   Colon polyp    Coronary artery disease    Diabetes mellitus 1987   under care of Dr. Chalmers Cater.  On insulin since 96 (off and on)   Diabetic retinopathy    Dupuytren contracture    R hand, s/p injection (Dr. Lenon Curt)   Essential hypertension, benign    Essential hypertension, benign 02/06/2019   Frequency of urination and polyuria    Hypertension    Myocardial infarction [facility])    denies   Neuromuscular disorder ([place])    Diabetic neuropathy   Osteomyelitis ([place])    right foot   Other testicular hypofunction    Peripheral arterial disease ([place]) 10/28/2012   Peritoneal abscess ([place]) 6/08   and buttock.   Pneumonia    Polydipsia    Proteinuria    Pure hyperglyceridemia    Subacute osteomyelitis, right ankle and foot ([place])    Wears glasses    Past Surgical History:  Procedure Laterality Date   ABDOMINAL AORTAGRAM N/A 04/18/2012   Procedure: ABDOMINAL Maxcine Ham;  Surgeon: Angelia Mould, MD;  Location: [facility] CATH LAB;  Service: Cardiovascular;  Laterality: N/A;   AMPUTATION Right 05/19/2019   Procedure: RIGHT FOOT 5TH RAY AMPUTATION;  Surgeon: Newt Minion, MD;  Location: [place];  Service: Orthopedics;  Laterality: Right;   AMPUTATION Right 03/11/2022   Procedure: RIGHT LEG DEBRIDEMENT VS. BELOW KNEE AMPUTATION;  Surgeon: Newt Minion, MD;  Location: [place];  Service: Orthopedics;  Laterality: Right;   CARDIAC CATHETERIZATION N/A 09/08/2016   Procedure: Left Heart Cath and Coronary Angiography;  Surgeon: Adrian Prows, MD;  Location: [place] CV LAB;  Service: Cardiovascular;  Laterality: N/A;   CATARACT EXTRACTION,  BILATERAL  09/2017, 10/2017   Dr. Herbert Deaner   COLONOSCOPY W/ BIOPSIES AND POLYPECTOMY     CORONARY/GRAFT ACUTE MI REVASCULARIZATION N/A 10/11/2020   Procedure: Coronary/Graft Acute MI Revascularization;  Surgeon: Adrian Prows, MD;  Location: [place] CV LAB;  Service: Cardiovascular;  Laterality: N/A;   I & D EXTREMITY Right 03/11/2022   Procedure: BELOW KNEE AMPUTATION;  Surgeon: Newt Minion, MD;  Location: [place];  Service: Orthopedics;  Laterality: Right;   LEFT HEART CATH N/A 12/31/2020   Procedure: Left Heart Cath;  Surgeon: Adrian Prows, MD;  Location: [place] CV LAB;  Service: Cardiovascular;  Laterality: N/A;   LEFT HEART CATH AND CORONARY ANGIOGRAPHY N/A 10/11/2020   Procedure: LEFT HEART CATH AND CORONARY ANGIOGRAPHY;  Surgeon: Adrian Prows, MD;  Location: [place] CV LAB;  Service: Cardiovascular;  Laterality: N/A;   LOWER EXTREMITY ANGIOGRAM Bilateral 04/18/2012   Procedure: LOWER EXTREMITY ANGIOGRAM;  Surgeon: Angelia Mould, MD;  Location: [facility] CATH LAB;  Service: Cardiovascular;  Laterality: Bilateral;  bilat lower extrem angio   LOWER EXTREMITY ANGIOGRAPHY Bilateral 05/02/2019   Procedure: LOWER EXTREMITY ANGIOGRAPHY;  Surgeon: Adrian Prows, MD;  Location: [place] CV LAB;  Service: Cardiovascular;  Laterality: Bilateral;   LOWER EXTREMITY ANGIOGRAPHY Bilateral 02/28/2019   Procedure: LOWER EXTREMITY ANGIOGRAPHY;  Surgeon: Adrian Prows, MD;  Location: [place] CV LAB;  Service: Cardiovascular;  Laterality: Bilateral;   macular photocoagulation     (eye treatments for diabetic retinopathy)-Dr. Zigmund Daniel   PERIPHERAL VASCULAR INTERVENTION  02/28/2019   Procedure: PERIPHERAL VASCULAR INTERVENTION;  Surgeon: Adrian Prows, MD;  Location: [place] CV LAB;  Service: Cardiovascular;;   TEE WITHOUT CARDIOVERSION N/A 03/18/2022   Procedure: TRANSESOPHAGEAL ECHOCARDIOGRAM (TEE);  Surgeon: Adrian Prows, MD;  Location: [facility] ENDOSCOPY;  Service: Cardiovascular;  Laterality: N/A;   VENTRICULAR  ASSIST DEVICE INSERTION N/A 12/31/2020   Procedure: VENTRICULAR ASSIST DEVICE INSERTION;  Surgeon: Adrian Prows, MD;  Location: [place] CV LAB;  Service: Cardiovascular;  Laterality: N/A;   Patient Active Problem List   Diagnosis Date Noted   Phantom pain after amputation of lower extremity ([place]) 09/18/2022   Chronic post-operative pain 09/18/2022   Abnormality of gait 09/18/2022   LBBB (left bundle branch block) 42/87/6811   Chronic systolic heart failure ([place]) 08/19/2022   Chronic right-sided congestive heart failure ([place]) 05/18/2022   Ejection fraction < 50% 04/17/2022   Insomnia    Constipation    S/P BKA (below knee amputation), right ([place]) 03/21/2022   Ischemic cardiomyopathy    AKI (acute kidney injury) ([place]) 03/13/2022   Streptococcal bacteremia 03/13/2022   Hypophosphatemia 03/13/2022   Septic shock ([place]) 57/26/2035   Acute systolic heart failure ([place]) 12/28/2020   Acute pulmonary edema ([place]) 10/11/2020   Non-ST elevation (NSTEMI) myocardial infarction ([place]) 10/11/2020   Acute respiratory distress 10/11/2020   Acute on chronic combined systolic and diastolic CHF (congestive heart failure) ([place]) 10/11/2020   Cardiogenic shock (HCC)    Partial nontraumatic amputation of foot, right ([place]) 06/21/2019   Osteomyelitis of right foot ([place])    Essential hypertension, benign 02/06/2019   Hypercholesteremia 02/06/2019   Stage 3b chronic kidney disease (CKD) ([place]) 10/06/2017   Proteinuria 10/06/2017   Controlled type 2 diabetes mellitus with hyperglycemia, with long-term current use of insulin ([place]) 10/06/2017   Coronary artery disease involving native coronary artery of native heart without angina pectoris 05/23/2017   Abnormal nuclear stress test 09/06/2016   PAD (peripheral artery disease) ([place]) 10/28/2012   Morbid obesity with BMI of 40.0-44.9, adult ([place]) 10/13/2012   Diabetes mellitus with ophthalmic complication ([place]) 59/74/1638   Hypertension associated with diabetes ([place])  10/13/2012   Atherosclerosis of native artery of extremity with intermittent claudication ([place]) 04/05/2012   Adenomatous colon polyp 09/28/2011   Diabetes mellitus ([place]) 09/28/2011   Pure hyperglyceridemia 09/28/2011   Erectile dysfunction 09/28/2011   Diabetic retinopathy ([place]) 09/28/2011    REFERRING DIAG: Z89.511 (ICD-10-CM) - S/P below knee amputation, right  ONSET DATE: 06/25/2022 prosthesis delivery  THERAPY DIAG:  No diagnosis found.  Rationale for Evaluation and Treatment Rehabilitation  PERTINENT HISTORY: combined systolic & diastolic CHF/cardiomyopathy EF 15-20%, DM2, PVD/PAD, CAD, CABG 2017, HTN, HLD, morbid obesity, CKD st3b, diabetic retinopathy  PRECAUTIONS: None  SUBJECTIVE:  *** He saw got an intestinal virus [place].  He is still wearing prosthesis most of awake hours but was less active.  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 He used some of PT recommendations with traveling which helped.   PAIN:  Are you having pain?   0/10 today  OBJECTIVE: (objective measures completed at initial evaluation unless otherwise dated)  OBJECTIVE:    COGNITION: 06/30/2022  Overall cognitive status: Within functional limits for tasks assessed   POSTURE: 06/30/2022  rounded shoulders, forward head, and weight shift left   LOWER EXTREMITY ROM:   ROM P:passive  A:active Right Eval  06/30/2022 Left eval  Hip flexion      Hip extension      Hip abduction      Hip adduction      Hip internal rotation      Hip external rotation      Knee flexion      Knee extension 0*    Ankle dorsiflexion      Ankle plantarflexion      Ankle inversion      Ankle eversion       (Blank rows = not tested)   LOWER EXTREMITY MMT:   MMT Right Eval 06/30/2022 Left eval  Hip flexion      Hip extension 4/5    Hip abduction 4/5    Hip adduction      Hip internal rotation      Hip external rotation      Knee flexion 4/5    Knee extension 4/5    Ankle dorsiflexion NA     Ankle plantarflexion NA    Ankle inversion NA    Ankle eversion NA    Evaluation testing functionally (Blank rows = not tested)   TRANSFERS: 06/30/2022  Sit to stand: SBA from chairs with armrests using back of LLE against chair to stabilize 06/30/2022   Stand to sit:  SBA using UEs to control descent   GAIT: 07/20/2022 Pt ambulates with step-through pattern with RW and Rt medial whip  06/30/2022 Gait pattern: step to pattern, decreased step length- Left, decreased stance time- Right, decreased stride length, decreased hip/knee flexion- Right, Right hip hike, antalgic, and abducted- Right Distance walked: 100' Assistive device utilized: Environmental consultant - 2 wheeled and TTA prosthesis Level of assistance: SBA  needs cues for gait deviations / no assist physically with RW support Comments: excessive UE weight bearing on RW to limit weight on prosthesis.    FUNCTIONAL TESTs:  Berg Balance Scale: 08/26/2022  45/56  Berg Balance Scale: 06/30/2022:   20/56    CURRENT PROSTHETIC WEAR ASSESSMENT: 06/30/2022 Patient is dependent with: skin check, residual limb care, prosthetic cleaning, ply sock cleaning, correct ply sock adjustment, proper wear schedule/adjustment, and proper weight-bearing schedule/adjustment Donning prosthesis: SBA Doffing prosthesis: SBA Prosthetic wear tolerance: 2 hours, 2x/day, 5 days since delivery Prosthetic weight bearing tolerance: 5 minutes with residual limb pain 3-4/10 Edema: pitting Residual limb condition:  scab medially 25m and dry scab lateral scar 323m  Good hair growth, normal color & temperature.  Cylindrical shape Prosthetic description: silicon liner with pin lock suspension, total contact socket, dynamic response foot K code/activity level with prosthetic use: Level 3     TODAY'S TREATMENT:  09/23/2022 ***  09/14/2022 Prosthetic Training with TTA prosthesis: Pt's residual limb has no open areas and skin has no changes noting concern.  PT instructed in  donning socks & pt return demo.   Neuromuscular re-education: Standing crossways on foam beam to facilitate ankle/residual limb & hip strategies for balance: -static eyes open -static eyes closed only up to 3 sec -eyes open head movements 4 directions with intermittent touch //bars & PT tactile cues, visual cues for upright with mirror. Step strategy anticipatory stepping off foam beam forward, backward and laterally right/left. He required touch on //bars and >1 step most of time to catch balance but improved esp with LLE (non-amputated LE). PT demo & verbal cues how to work on step strategy in corner. Pt verbalized understanding.    08/26/2022 Prosthetic Training with TTA prosthesis PT recommended increasing wear to all awake hours  checking for sweat every 3-4 hours. He may need to limit weight bearing if distal tibia is hurting.  He can limit by time or assistive devices that give more UE support.  Pt verbalized understanding. Pt is planning 4-day vacation to [place] leaving this weekend.  PT recommended making & using a packing list for prosthetic items to ensure that he does not forget anything. PT used internet to determine his fishing chair which he reports he can sit/stand even on grass in his yard.  Discussed 3-legged fishing chair as option for shower chair & possible "water leg" into the future.  PT demo & verbal cues on negotiating steeper inclines with sidestepping or transversing on diagonal. Pt return demo & verbalized understanding with cane.  Pt amb 200' with cane safely.  Berg Balance 45/56 see objective   HOME EXERCISE PROGRAM:  Access Code: [email or web address] URL: [email or web address] Date: 07/20/2022 Prepared by: Jamey Reas  Exercises - Seated Quad Set  - 3-5 x daily - 7 x weekly - 2-3 sets - 10-20 reps - 5 seconds hold - Straight Leg Raise with External Rotation  - 3-5 x daily - 7 x weekly - 2-3 sets - 10-20 reps - 5 seconds hold   Do each exercise 1-2   times per day Do each exercise 5-10 repetitions Hold each exercise for 2 seconds to feel your location  AT [place].  Try to find this position when standing still for activities.   USE TAPE ON FLOOR TO MARK THE MIDLINE POSITION which is even with middle of sink.  You also should try to feel with your limb pressure in socket.  You are trying to feel with limb what you used to feel with the bottom of your foot.  Side to Side Shift: Moving your hips only (not shoulders): move weight onto your left leg, HOLD/FEEL pressure in socket.  Move back to equal weight on each leg, HOLD/FEEL pressure in socket. Move weight onto your right leg, HOLD/FEEL pressure in socket. Move back to equal weight on each leg, HOLD/FEEL pressure in socket. Repeat.  Start with both hands on sink, progress to hand on prosthetic side only, then no hands.  Front to Back Shift: Moving your hips only (not shoulders): move your weight forward onto your toes, HOLD/FEEL pressure in socket. Move your weight back to equal Flat Foot on both legs, HOLD/FEEL  pressure in socket. Move your weight back onto your heels, HOLD/FEEL  pressure in socket. Move your weight back to equal on both legs, HOLD/FEEL  pressure in socket. Repeat.  Start with both hands on sink, progress to hand on prosthetic side only, then no hands.  Moving Cones / Cups: With equal weight on each leg: Hold on with one hand the first time, then progress to no hand supports. Move cups from one side of sink to the other. Place cups ~2" out of your reach, progress to 10" beyond reach.  Place one hand in middle of sink and reach with other hand. Do both arms.  Then hover one hand and move cups with other hand.  Overhead/Upward Reaching: alternated reaching up to top cabinets or ceiling if no cabinets present. Keep equal weight on each leg. Start with one hand support on counter while other hand reaches and progress  to no hand support with reaching.  ace one hand in middle of sink and reach with other hand. Do both arms.  Then hover  one hand and move cups with other hand.  5.   Looking Over Shoulders: With equal weight on each leg: alternate turning to look over your shoulders with one hand support on counter as needed.  Start with head motions only to look in front of shoulder, then even with shoulder and progress to looking behind you. To look to side, move head /eyes, then shoulder on side looking pulls back, shift more weight to side looking and pull hip back. Place one hand in middle of sink and let go with other hand so your shoulder can pull back. Switch hands to look other way.   Then hover one hand and look over shoulder. If looking right, use left hand at sink. If looking left, use right hand at sink. 6.  Stepping with leg that is not amputated:  Move items under cabinet out of your way. Shift your hips/pelvis so weight on prosthesis. Tighten muscles in hip on prosthetic side.  SLOWLY step other leg so front of foot is in cabinet. Then step back to floor.       ASSESSMENT: CLINICAL IMPRESSION: ***  PT worked on balance activities to facilitate ankle/residual limb, hip & step strategies. He improved with instruction but needs more work to reduce fall risk.  Pt continues to benefit from skilled care.    OBJECTIVE IMPAIRMENTS Abnormal gait, cardiopulmonary status limiting activity, decreased activity tolerance, decreased balance, decreased endurance, decreased knowledge of condition, decreased knowledge of use of DME, decreased mobility, difficulty walking, decreased strength, increased edema, postural dysfunction, prosthetic dependency , obesity, pain, and skin integrity issues on residual limb .    ACTIVITY LIMITATIONS carrying, lifting, bending, sitting, standing, squatting, stairs, transfers, locomotion level, and safe prosthesis use   PARTICIPATION LIMITATIONS: driving, community activity, and  recreational activities   [place], Time since onset of injury/illness/exacerbation, and 3+ comorbidities: see PMH  are also affecting patient's functional outcome.    REHAB POTENTIAL: Good   CLINICAL DECISION MAKING: Evolving/moderate complexity   EVALUATION COMPLEXITY: Moderate     GOALS: Goals reviewed with patient? Yes   SHORT TERM GOALS: Target date: 08/27/2022   Patient verbalizes adjusting ply socks & sweat management.  Baseline: SEE OBJECTIVE DATA Goal status: MET 08/26/2022 2.  Patient tolerates prosthesis >12 hrs total /day with no skin issues or limb pain after standing. Baseline: SEE OBJECTIVE DATA Goal status:  MET 08/26/2022  3.  Berg Balance Test >/= 36/56 Baseline: SEE OBJECTIVE DATA Goal status: MET 08/26/2022   4. Patient ambulates 200' with cane & prosthesis with supervision. Baseline: SEE OBJECTIVE DATA Goal status: MET 08/26/2022   5. Patient negotiates ramps & curbs with cane & prosthesis with minA. Baseline: SEE OBJECTIVE DATA Goal status:  MET 08/26/2022     LONG TERM GOALS: Target date: 09/24/2022   Patient demonstrates & verbalized understanding of prosthetic care to enable safe utilization of prosthesis. Baseline: SEE OBJECTIVE DATA Goal status: ongoing 08/26/2022   Patient tolerates prosthesis wear >90% of awake hours without skin or limb pain issues. Baseline: SEE OBJECTIVE DATA Goal status: ongoing 08/26/2022   Berg Balance > 48/56  to indicate lower fall risk Baseline: SEE OBJECTIVE DATA Goal status: ongoing 08/26/2022   Patient ambulates >500' with prosthesis & cane or less independently Baseline: SEE OBJECTIVE DATA Goal status: ongoing 08/26/2022   Patient negotiates ramps, curbs & stairs with single rail with prosthesis cane or less independently. Baseline: SEE OBJECTIVE DATA Goal status: ongoing 08/26/2022   Patient demonstrates & verbalizes climbing  ladder, lifting, carrying, pushing, pulling with prosthesis  safely. Baseline: SEE OBJECTIVE DATA Goal status: ongoing 08/26/2022   Patient verbalizes & demonstrates understanding of fishing simulations.  Baseline: SEE OBJECTIVE DATA Goal status: ongoing 08/26/2022   PLAN: PT FREQUENCY: 2x/week   PT DURATION: 90 days / 13 weeks   PLANNED INTERVENTIONS: Therapeutic exercises, Therapeutic activity, Neuromuscular re-education, Balance training, Gait training, Patient/Family education, Stair training, Vestibular training, Prosthetic training, DME instructions, and Physical Performance Testing   PLAN FOR NEXT SESSION:  *** work on standing balance activities.   work towards [employer].   Jamey Reas, PT, DPT 09/23/2022, 9:00 AM

## 2022-09-25 ENCOUNTER — Encounter: Payer: Self-pay | Admitting: Internal Medicine

## 2022-09-25 ENCOUNTER — Ambulatory Visit: Payer: Medicare Other | Attending: Internal Medicine | Admitting: Internal Medicine

## 2022-09-25 VITALS — BP 90/58 | HR 85 | Ht 70.0 in | Wt 246.0 lb

## 2022-09-25 DIAGNOSIS — I5022 Chronic systolic (congestive) heart failure: Secondary | ICD-10-CM | POA: Diagnosis not present

## 2022-09-25 DIAGNOSIS — I255 Ischemic cardiomyopathy: Secondary | ICD-10-CM

## 2022-09-25 NOTE — Progress Notes (Signed)
HPI Eduardo [name] is referred by Dr. Shellia Carwin for consideration for Biv ICD insertion. He is a pleasant 66 yo man with obesity, longstanding DM, chronic systolic heart failure and LBBB. He has an EF of 25% by echo with severe 3 vessel CAD, not amenable to revascularization. He has chronic stage 2 renal insufficiency. He has not had syncope. He has class 2B CHF. His QRS is 150 ms. He denies anginal symptoms. Sob is thought to be his anginal equivalent.  Allergies  Allergen Reactions   Shellfish-Derived Products Nausea And Vomiting and Other (See Comments)    Only Mussels cause severe nausea and vomiting    Latex Rash   Tape Rash and Other (See Comments)    Caused issues with the skin   Testosterone Rash    Other reaction(s): did not feel well Other reaction(s): did not feel well     Current Outpatient Medications  Medication Sig Dispense Refill   acetaminophen (TYLENOL) 325 MG tablet Take 1-2 tablets (325-650 mg total) by mouth every 4 (four) hours as needed for mild pain.     aspirin 81 MG chewable tablet Chew 1 tablet (81 mg total) by mouth daily.     Bioflavonoid Products (ESTER C PO) Take 1,000 mg by mouth daily.     Cholecalciferol (VITAMIN D) 2000 units CAPS Take 2,000 Units by mouth daily.     clopidogrel (PLAVIX) 75 MG tablet Take 1 tablet (75 mg total) by mouth daily. 90 tablet 1   ENTRESTO 24-26 MG TAKE 1 TABLET BY MOUTH TWICE  DAILY 180 tablet 1   ezetimibe (ZETIA) 10 MG tablet Take 1 tablet (10 mg total) by mouth daily. 90 tablet 3   gemfibrozil (LOPID) 600 MG tablet Take 600 mg by mouth 2 (two) times daily before a meal.     HUMALOG KWIKPEN 100 UNIT/ML KiwkPen Inject 0-10 Units into the skin See admin instructions. Inject 0-10 units into the skin three times a day, per sliding scale- based on BGL >100  1   HUMULIN N KWIKPEN 100 UNIT/ML Kiwkpen Inject 20 Units into the skin See admin instructions. Inject 35 units into the skin before breakfast  1   Krill Oil 300 MG CAPS       melatonin 10 MG TABS Take 10 mg by mouth at bedtime.  0   Multiple Vitamin (MULTIVITAMIN WITH MINERALS) TABS tablet Take 1 tablet by mouth daily.     nitroGLYCERIN (NITROSTAT) 0.4 MG SL tablet Place 1 tablet (0.4 mg total) under the tongue every 5 (five) minutes as needed for chest pain. 15 tablet 3   Probiotic Product (PROBIOTIC-10 PO) Take 1 capsule by mouth daily.     rosuvastatin (CRESTOR) 20 MG tablet Take 1 tablet (20 mg total) by mouth daily. 90 tablet 3   tirzepatide (MOUNJARO) 7.5 MG/0.5ML Pen See admin instructions.     torsemide (DEMADEX) 20 MG tablet Take 1 tablet (20 mg total) by mouth 2 (two) times daily. 90 tablet 1   traMADol (ULTRAM) 50 MG tablet Take 1 tablet (50 mg total) by mouth every 6 (six) hours as needed. Post BKA 28 tablet 0   No current facility-administered medications for this visit.     Past Medical History:  Diagnosis Date   CHF (congestive heart failure) (HCC)    Chronic kidney disease    stage 3   Colon polyp    Coronary artery disease    Diabetes mellitus 1987   under care of Dr.  Balan.  On insulin since 96 (off and on)   Diabetic retinopathy    Dupuytren contracture    R hand, s/p injection (Dr. Lenon Curt)   Essential hypertension, benign    Essential hypertension, benign 02/06/2019   Frequency of urination and polyuria    Hypertension    Myocardial infarction [facility])    denies   Neuromuscular disorder ([place])    Diabetic neuropathy   Osteomyelitis ([place])    right foot   Other testicular hypofunction    Peripheral arterial disease ([place]) 10/28/2012   Peritoneal abscess ([place]) 6/08   and buttock.   Pneumonia    Polydipsia    Proteinuria    Pure hyperglyceridemia    Subacute osteomyelitis, right ankle and foot (HCC)    Wears glasses     ROS:   All systems reviewed and negative except as noted in the HPI.   Past Surgical History:  Procedure Laterality Date   ABDOMINAL AORTAGRAM N/A 04/18/2012   Procedure: ABDOMINAL Maxcine Ham;  Surgeon:  Angelia Mould, MD;  Location: [facility] CATH LAB;  Service: Cardiovascular;  Laterality: N/A;   AMPUTATION Right 05/19/2019   Procedure: RIGHT FOOT 5TH RAY AMPUTATION;  Surgeon: Newt Minion, MD;  Location: [place];  Service: Orthopedics;  Laterality: Right;   AMPUTATION Right 03/11/2022   Procedure: RIGHT LEG DEBRIDEMENT VS. BELOW KNEE AMPUTATION;  Surgeon: Newt Minion, MD;  Location: [place];  Service: Orthopedics;  Laterality: Right;   CARDIAC CATHETERIZATION N/A 09/08/2016   Procedure: Left Heart Cath and Coronary Angiography;  Surgeon: Adrian Prows, MD;  Location: [place] CV LAB;  Service: Cardiovascular;  Laterality: N/A;   CATARACT EXTRACTION, BILATERAL  09/2017, 10/2017   Dr. Herbert Deaner   COLONOSCOPY W/ BIOPSIES AND POLYPECTOMY     CORONARY/GRAFT ACUTE MI REVASCULARIZATION N/A 10/11/2020   Procedure: Coronary/Graft Acute MI Revascularization;  Surgeon: Adrian Prows, MD;  Location: [place] CV LAB;  Service: Cardiovascular;  Laterality: N/A;   I & D EXTREMITY Right 03/11/2022   Procedure: BELOW KNEE AMPUTATION;  Surgeon: Newt Minion, MD;  Location: [place];  Service: Orthopedics;  Laterality: Right;   LEFT HEART CATH N/A 12/31/2020   Procedure: Left Heart Cath;  Surgeon: Adrian Prows, MD;  Location: [place] CV LAB;  Service: Cardiovascular;  Laterality: N/A;   LEFT HEART CATH AND CORONARY ANGIOGRAPHY N/A 10/11/2020   Procedure: LEFT HEART CATH AND CORONARY ANGIOGRAPHY;  Surgeon: Adrian Prows, MD;  Location: [place] CV LAB;  Service: Cardiovascular;  Laterality: N/A;   LOWER EXTREMITY ANGIOGRAM Bilateral 04/18/2012   Procedure: LOWER EXTREMITY ANGIOGRAM;  Surgeon: Angelia Mould, MD;  Location: [facility] C[facility] LAB;  Service: Cardiovascular;  Laterality: Bilateral;  bilat lower extrem angio   LOWER EXTREMITY ANGIOGRAPHY Bilateral 05/02/2019   Procedure: LOWER EXTREMITY ANGIOGRAPHY;  Surgeon: Adrian Prows, MD;  Location: [place] CV LAB;  Service: Cardiovascular;  Laterality: Bilateral;   LOWER  EXTREMITY ANGIOGRAPHY Bilateral 02/28/2019   Procedure: LOWER EXTREMITY ANGIOGRAPHY;  Surgeon: Adrian Prows, MD;  Location: [place] CV LAB;  Service: Cardiovascular;  Laterality: Bilateral;   macular photocoagulation     (eye treatments for diabetic retinopathy)-Dr. Zigmund Daniel   PERIPHERAL VASCULAR INTERVENTION  02/28/2019   Procedure: PERIPHERAL VASCULAR INTERVENTION;  Surgeon: Adrian Prows, MD;  Location: [place] CV LAB;  Service: Cardiovascular;;   TEE WITHOUT CARDIOVERSION N/A 03/18/2022   Procedure: TRANSESOPHAGEAL ECHOCARDIOGRAM (TEE);  Surgeon: Adrian Prows, MD;  Location: [place];  Service: Cardiovascular;  Laterality: N/A;   VENTRICULAR ASSIST DEVICE  INSERTION N/A 12/31/2020   Procedure: VENTRICULAR ASSIST DEVICE INSERTION;  Surgeon: Adrian Prows, MD;  Location: [place] CV LAB;  Service: Cardiovascular;  Laterality: N/A;     Family History  Problem Relation Age of Onset   Diabetes Mother    Hearing loss Mother    Hypertension Mother    Hyperlipidemia Mother    Heart disease Mother    Varicose Veins Mother    Varicose Veins Father    Dementia Father    Hyperlipidemia Brother    Diabetes Maternal Grandmother      Social History   Socioeconomic History   Marital status: Widowed    Spouse name: Not on file   Number of children: 0   Years of education: Not on file   Highest education level: Not on file  Occupational History   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 Retired. No pets. Enjoys fishing.   Updated 06/2021   Social Determinants of Health   Financial Resource Strain: Not on file  Food Insecurity: Not on file  Transportation Needs: Not on file  Physical Activity: Not on file  Stress: Not on file  Social Connections: Not on file  Intimate Partner Violence: Not on file     BP (!) 90/58   Pulse 85   Ht '5\' 10"'$  (1.778 m)   Wt 246 lb (111.6 kg)   SpO2 98%   BMI 35.30 kg/m   Physical Exam:  Well appearing NAD HEENT: Unremarkable Neck:  No JVD, no thyromegally Lymphatics:  No adenopathy Back:  No CVA tenderness Lungs:  Clear with no wheezes HEART:  Regular rate rhythm, no murmurs, no rubs, no clicks; split S2.  Abd:  soft, positive bowel sounds, no organomegally, no rebound, no guarding Ext:  2 plus pulses, no edema, no cyanosis, no clubbing Skin:  No rashes no nodules Neuro:  CN II through XII intact, motor grossly intact  EKG - nsr with LBBB, qrs of 156  DEVICE  Normal device function.  See PaceArt for details.   Assess/Plan: Chronic systolic heart failure - he has been on GDMT. I have discussed the treatment options with the patient and he would like to proceed with insertion of a Biv ICD. I have reviewed the indications/risks/benefits/goals/expectations of device insertion and he wishes to proceed. CAD - he does not have angina. He is encouraged to stay active. PVD - he is s/p PVI and right BKA.  Obesity - he will be encouraged to lose weight.   Carleene Overlie Nesbit Michon,MD

## 2022-09-25 NOTE — Patient Instructions (Addendum)
Medication Instructions:  Your physician recommends that you continue on your current medications as directed. Please refer to the Current Medication list given to you today.  *If you need a refill on your cardiac medications before your next appointment, please call your pharmacy*  Lab Work: You will have blood work drawn today:  CBC and BMET.  Testing/Procedures: None ordered.  Follow-Up: See Instruction letter:  Dr. Cristopher Peru is ordering a [employer] Jude BiV ICD.    See ICD Booklet

## 2022-09-25 NOTE — H&P (View-Only) (Signed)
HPI [name] is referred by Dr. Shellia Carwin for consideration for Biv ICD insertion. He is a pleasant 66 yo man with obesity, longstanding DM, chronic systolic heart failure and LBBB. He has an EF of 25% by echo with severe 3 vessel CAD, not amenable to revascularization. He has chronic stage 2 renal insufficiency. He has not had syncope. He has class 2B CHF. His QRS is 150 ms. He denies anginal symptoms. Sob is thought to be his anginal equivalent.  Allergies  Allergen Reactions   Shellfish-Derived Products Nausea And Vomiting and Other (See Comments)    Only Mussels cause severe nausea and vomiting    Latex Rash   Tape Rash and Other (See Comments)    Caused issues with the skin   Testosterone Rash    Other reaction(s): did not feel well Other reaction(s): did not feel well     Current Outpatient Medications  Medication Sig Dispense Refill   acetaminophen (TYLENOL) 325 MG tablet Take 1-2 tablets (325-650 mg total) by mouth every 4 (four) hours as needed for mild pain.     aspirin 81 MG chewable tablet Chew 1 tablet (81 mg total) by mouth daily.     Bioflavonoid Products (ESTER C PO) Take 1,000 mg by mouth daily.     Cholecalciferol (VITAMIN D) 2000 units CAPS Take 2,000 Units by mouth daily.     clopidogrel (PLAVIX) 75 MG tablet Take 1 tablet (75 mg total) by mouth daily. 90 tablet 1   ENTRESTO 24-26 MG TAKE 1 TABLET BY MOUTH TWICE  DAILY 180 tablet 1   ezetimibe (ZETIA) 10 MG tablet Take 1 tablet (10 mg total) by mouth daily. 90 tablet 3   gemfibrozil (LOPID) 600 MG tablet Take 600 mg by mouth 2 (two) times daily before a meal.     HUMALOG KWIKPEN 100 UNIT/ML KiwkPen Inject 0-10 Units into the skin See admin instructions. Inject 0-10 units into the skin three times a day, per sliding scale- based on BGL >100  1   HUMULIN N KWIKPEN 100 UNIT/ML Kiwkpen Inject 20 Units into the skin See admin instructions. Inject 35 units into the skin before breakfast  1   Krill Oil 300 MG CAPS       melatonin 10 MG TABS Take 10 mg by mouth at bedtime.  0   Multiple Vitamin (MULTIVITAMIN WITH MINERALS) TABS tablet Take 1 tablet by mouth daily.     nitroGLYCERIN (NITROSTAT) 0.4 MG SL tablet Place 1 tablet (0.4 mg total) under the tongue every 5 (five) minutes as needed for chest pain. 15 tablet 3   Probiotic Product (PROBIOTIC-10 PO) Take 1 capsule by mouth daily.     rosuvastatin (CRESTOR) 20 MG tablet Take 1 tablet (20 mg total) by mouth daily. 90 tablet 3   tirzepatide (MOUNJARO) 7.5 MG/0.5ML Pen See admin instructions.     torsemide (DEMADEX) 20 MG tablet Take 1 tablet (20 mg total) by mouth 2 (two) times daily. 90 tablet 1   traMADol (ULTRAM) 50 MG tablet Take 1 tablet (50 mg total) by mouth every 6 (six) hours as needed. Post BKA 28 tablet 0   No current facility-administered medications for this visit.     Past Medical History:  Diagnosis Date   CHF (congestive heart failure) (HCC)    Chronic kidney disease    stage 3   Colon polyp    Coronary artery disease    Diabetes mellitus 1987   under care of Dr.  Balan.  On insulin since 96 (off and on)   Diabetic retinopathy    Dupuytren contracture    R hand, s/p injection (Dr. Lenon Curt)   Essential hypertension, benign    Essential hypertension, benign 02/06/2019   Frequency of urination and polyuria    Hypertension    Myocardial infarction [facility])    denies   Neuromuscular disorder ([place])    Diabetic neuropathy   Osteomyelitis ([place])    right foot   Other testicular hypofunction    Peripheral arterial disease ([place]) 10/28/2012   Peritoneal abscess ([place]) 6/08   and buttock.   Pneumonia    Polydipsia    Proteinuria    Pure hyperglyceridemia    Subacute osteomyelitis, right ankle and foot (HCC)    Wears glasses     ROS:   All systems reviewed and negative except as noted in the HPI.   Past Surgical History:  Procedure Laterality Date   ABDOMINAL AORTAGRAM N/A 04/18/2012   Procedure: ABDOMINAL Maxcine Ham;  Surgeon:  Angelia Mould, MD;  Location: [facility] CATH LAB;  Service: Cardiovascular;  Laterality: N/A;   AMPUTATION Right 05/19/2019   Procedure: RIGHT FOOT 5TH RAY AMPUTATION;  Surgeon: Newt Minion, MD;  Location: [place];  Service: Orthopedics;  Laterality: Right;   AMPUTATION Right 03/11/2022   Procedure: RIGHT LEG DEBRIDEMENT VS. BELOW KNEE AMPUTATION;  Surgeon: Newt Minion, MD;  Location: [place];  Service: Orthopedics;  Laterality: Right;   CARDIAC CATHETERIZATION N/A 09/08/2016   Procedure: Left Heart Cath and Coronary Angiography;  Surgeon: Adrian Prows, MD;  Location: [place] CV LAB;  Service: Cardiovascular;  Laterality: N/A;   CATARACT EXTRACTION, BILATERAL  09/2017, 10/2017   Dr. Herbert Deaner   COLONOSCOPY W/ BIOPSIES AND POLYPECTOMY     CORONARY/GRAFT ACUTE MI REVASCULARIZATION N/A 10/11/2020   Procedure: Coronary/Graft Acute MI Revascularization;  Surgeon: Adrian Prows, MD;  Location: [place] CV LAB;  Service: Cardiovascular;  Laterality: N/A;   I & D EXTREMITY Right 03/11/2022   Procedure: BELOW KNEE AMPUTATION;  Surgeon: Newt Minion, MD;  Location: [place];  Service: Orthopedics;  Laterality: Right;   LEFT HEART CATH N/A 12/31/2020   Procedure: Left Heart Cath;  Surgeon: Adrian Prows, MD;  Location: [place] CV LAB;  Service: Cardiovascular;  Laterality: N/A;   LEFT HEART CATH AND CORONARY ANGIOGRAPHY N/A 10/11/2020   Procedure: LEFT HEART CATH AND CORONARY ANGIOGRAPHY;  Surgeon: Adrian Prows, MD;  Location: [place] CV LAB;  Service: Cardiovascular;  Laterality: N/A;   LOWER EXTREMITY ANGIOGRAM Bilateral 04/18/2012   Procedure: LOWER EXTREMITY ANGIOGRAM;  Surgeon: Angelia Mould, MD;  Location: [facility] CATH LAB;  Service: Cardiovascular;  Laterality: Bilateral;  bilat lower extrem angio   LOWER EXTREMITY ANGIOGRAPHY Bilateral 05/02/2019   Procedure: LOWER EXTREMITY ANGIOGRAPHY;  Surgeon: Adrian Prows, MD;  Location: [place] CV LAB;  Service: Cardiovascular;  Laterality: Bilateral;   LOWER  EXTREMITY ANGIOGRAPHY Bilateral 02/28/2019   Procedure: LOWER EXTREMITY ANGIOGRAPHY;  Surgeon: Adrian Prows, MD;  Location: [place] CV LAB;  Service: Cardiovascular;  Laterality: Bilateral;   macular photocoagulation     (eye treatments for diabetic retinopathy)-Dr. Zigmund Daniel   PERIPHERAL VASCULAR INTERVENTION  02/28/2019   Procedure: PERIPHERAL VASCULAR INTERVENTION;  Surgeon: Adrian Prows, MD;  Location: [place] CV LAB;  Service: Cardiovascular;;   TEE WITHOUT CARDIOVERSION N/A 03/18/2022   Procedure: TRANSESOPHAGEAL ECHOCARDIOGRAM (TEE);  Surgeon: Adrian Prows, MD;  Location: [place];  Service: Cardiovascular;  Laterality: N/A;   VENTRICULAR ASSIST DEVICE  INSERTION N/A 12/31/2020   Procedure: VENTRICULAR ASSIST DEVICE INSERTION;  Surgeon: Adrian Prows, MD;  Location: [place] CV LAB;  Service: Cardiovascular;  Laterality: N/A;     Family History  Problem Relation Age of Onset   Diabetes Mother    Hearing loss Mother    Hypertension Mother    Hyperlipidemia Mother    Heart disease Mother    Varicose Veins Mother    Varicose Veins Father    Dementia Father    Hyperlipidemia Brother    Diabetes Maternal Grandmother      Social History   Socioeconomic History   Marital status: Widowed    Spouse name: Not on file   Number of children: 0   Years of education: Not on file   Highest education level: Not on file  Occupational History   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 Retired. No pets. Enjoys fishing.   Updated 06/2021   Social Determinants of Health   Financial Resource Strain: Not on file  Food Insecurity: Not on file  Transportation Needs: Not on file  Physical Activity: Not on file  Stress: Not on file  Social Connections: Not on file  Intimate Partner Violence: Not on file     BP (!) 90/58   Pulse 85   Ht '5\' 10"'$  (1.778 m)   Wt 246 lb (111.6 kg)   SpO2 98%   BMI 35.30 kg/m   Physical Exam:  Well appearing NAD HEENT: Unremarkable Neck:  No JVD, no thyromegally Lymphatics:  No adenopathy Back:  No CVA tenderness Lungs:  Clear with no wheezes HEART:  Regular rate rhythm, no murmurs, no rubs, no clicks; split S2.  Abd:  soft, positive bowel sounds, no organomegally, no rebound, no guarding Ext:  2 plus pulses, no edema, no cyanosis, no clubbing Skin:  No rashes no nodules Neuro:  CN II through XII intact, motor grossly intact  EKG - nsr with LBBB, qrs of 156  DEVICE  Normal device function.  See PaceArt for details.   Assess/Plan: Chronic systolic heart failure - he has been on GDMT. I have discussed the treatment options with the patient and he would like to proceed with insertion of a Biv ICD. I have reviewed the indications/risks/benefits/goals/expectations of device insertion and he wishes to proceed. CAD - he does not have angina. He is encouraged to stay active. PVD - he is s/p PVI and right BKA.  Obesity - he will be encouraged to lose weight.   Carleene Overlie [place]au Code,MD

## 2022-09-26 LAB — BASIC METABOLIC PANEL
BUN/Creatinine Ratio: 37 — ABNORMAL HIGH (ref 10–24)
BUN: 56 mg/dL — ABNORMAL HIGH (ref 8–27)
CO2: 22 mmol/L (ref 20–29)
Calcium: 9.3 mg/dL (ref 8.6–10.2)
Chloride: 104 mmol/L (ref 96–106)
Creatinine, Ser: 1.51 mg/dL — ABNORMAL HIGH (ref 0.76–1.27)
Glucose: 131 mg/dL — ABNORMAL HIGH (ref 70–99)
Potassium: 4.3 mmol/L (ref 3.5–5.2)
Sodium: 143 mmol/L (ref 134–144)
eGFR: 51 mL/min/{1.73_m2} — ABNORMAL LOW (ref >59)

## 2022-09-26 LAB — CBC WITH DIFFERENTIAL/PLATELET
Basophils Absolute: 0.1 10*3/uL (ref 0.0–0.2)
Basos: 1 %
EOS (ABSOLUTE): 0.3 10*3/uL (ref 0.0–0.4)
Eos: 3 %
Hematocrit: 41.1 % (ref 37.5–51.0)
Hemoglobin: 13.4 g/dL (ref 13.0–17.7)
Immature Grans (Abs): 0 10*3/uL (ref 0.0–0.1)
Immature Granulocytes: 0 %
Lymphocytes Absolute: 1.3 10*3/uL (ref 0.7–3.1)
Lymphs: 15 %
MCH: 29.8 pg (ref 26.6–33.0)
MCHC: 32.6 g/dL (ref 31.5–35.7)
MCV: 92 fL (ref 79–97)
Monocytes Absolute: 0.6 10*3/uL (ref 0.1–0.9)
Monocytes: 7 %
Neutrophils Absolute: 6.3 10*3/uL (ref 1.4–7.0)
Neutrophils: 74 %
Platelets: 185 10*3/uL (ref 150–450)
RBC: 4.49 x10E6/uL (ref 4.14–5.80)
RDW: 13.5 % (ref 11.6–15.4)
WBC: 8.6 10*3/uL (ref 3.4–10.8)

## 2022-09-30 DIAGNOSIS — I255 Ischemic cardiomyopathy: Secondary | ICD-10-CM | POA: Diagnosis not present

## 2022-09-30 DIAGNOSIS — T8743 Infection of amputation stump, right lower extremity: Secondary | ICD-10-CM | POA: Diagnosis not present

## 2022-09-30 DIAGNOSIS — Z89511 Acquired absence of right leg below knee: Secondary | ICD-10-CM | POA: Diagnosis not present

## 2022-10-15 ENCOUNTER — Ambulatory Visit (INDEPENDENT_AMBULATORY_CARE_PROVIDER_SITE_OTHER): Payer: Medicare Other

## 2022-10-15 ENCOUNTER — Encounter: Payer: Self-pay | Admitting: Orthopedic Surgery

## 2022-10-15 ENCOUNTER — Ambulatory Visit: Payer: Medicare Other | Admitting: Orthopedic Surgery

## 2022-10-15 DIAGNOSIS — Z89511 Acquired absence of right leg below knee: Secondary | ICD-10-CM

## 2022-10-15 NOTE — Progress Notes (Signed)
Office Visit Note   Patient: Eduardo [name]           Date of Birth: 05-29-1956           MRN: [id number] Visit Date: 10/15/2022              Requested by: Rita Ohara, MD [address] [place],  [place] [address] PCP: Rita Ohara, MD  Chief Complaint  Patient presents with   Right Leg - Follow-up    03/11/2022 right BKA with Kerecis      HPI: Patient is a 66 year old gentleman who is 7 months status post right transtibial amputation with Kerecis tissue graft reinforcement.   Assessment & Plan: Visit Diagnoses:  1. S/P below knee amputation, right ([place])     Plan: Will follow-up in 3 months.  Patient will need a new socket most likely he would benefit from a vacuum liner.  He will follow-up sooner if he develops any open ulcers.  Follow-Up Instructions: Return in about 3 months (around 01/14/2023).   Ortho Exam  Patient is alert, oriented, no adenopathy, well-dressed, normal affect, normal respiratory effort. Examination patient has some redness over the residual limb and some prominence of the distal tibia.  The area of tissue reinforcement shows healthy firm tissue over the residual limb.  There is no migration of the soft tissue.  Patient is currently wearing 16 ply sock as well as his socket has been modified.  Imaging: XR Tibia/Fibula Right  Result Date: 10/15/2022 2 view radiographs of the right tibia and fibula show calcification of the popliteal arteries there is some mild calcification across the syndesmosis from the [facility] tissue reinforcement.  No images are attached to the encounter.  Labs: Lab Results  Component Value Date   HGBA1C 6.6 06/23/2022   HGBA1C 7.3 (H) 03/11/2022   HGBA1C 6.4 12/23/2021   REPTSTATUS 03/17/2022 FINAL 03/12/2022   GRAMSTAIN  09/16/2021    FEW WBC PRESENT, PREDOMINANTLY MONONUCLEAR RARE GRAM POSITIVE COCCI FEW GRAM NEGATIVE RODS    CULT  03/12/2022    NO GROWTH 5 DAYS Performed at [facility], [place] [address]  [place], [place] [address]    LABORGA STREPTOCOCCUS ANGINOSIS 03/08/2022     Lab Results  Component Value Date   ALBUMIN 3.6 (L) 04/08/2022   ALBUMIN 2.6 (L) 03/23/2022   ALBUMIN 2.1 (L) 03/11/2022   PREALBUMIN 6.5 (L) 03/11/2022    Lab Results  Component Value Date   MG 2.2 03/11/2022   MG 2.2 03/10/2022   MG 2.0 03/09/2022   Lab Results  Component Value Date   VD25OH 75.05 03/11/2022   VD25OH 54.2 12/05/2021    Lab Results  Component Value Date   PREALBUMIN 6.5 (L) 03/11/2022      Latest Ref Rng & Units 09/25/2022   12:24 PM 04/17/2022   11:35 AM 04/08/2022    1:26 PM  CBC EXTENDED  WBC 3.4 - 10.8 x10E3/uL 8.6  6.3  5.7   RBC 4.14 - 5.80 x10E6/uL 4.49  3.60  3.91   Hemoglobin 13.0 - 17.7 g/dL 13.4  11.0  11.8   HCT 37.5 - 51.0 % 41.1  33.4  35.6   Platelets 150 - 450 x10E3/uL 185  236  196   NEUT# 1.4 - 7.0 x10E3/uL 6.3  4.3  3.3   Lymph# 0.7 - 3.1 x10E3/uL 1.3  1.3  1.5      There is no height or weight on file to calculate BMI.  Orders:  Orders Placed This Encounter  Procedures   XR Tibia/Fibula Right   No orders of the defined types were placed in this encounter.    Procedures: No procedures performed  Clinical Data: No additional findings.  ROS:  All other systems negative, except as noted in the HPI. Review of Systems  Objective: Vital Signs: There were no vitals taken for this visit.  Specialty Comments:  No specialty comments available.  PMFS History: Patient Active Problem List   Diagnosis Date Noted   Phantom pain after amputation of lower extremity ([place]) 09/18/2022   Chronic post-operative pain 09/18/2022   Abnormality of gait 09/18/2022   LBBB (left bundle branch block) 73/42/8768   Chronic systolic heart failure ([place]) 08/19/2022   Chronic right-sided congestive heart failure ([place]) 05/18/2022   Ejection fraction < 50% 04/17/2022   Insomnia    Constipation    S/P BKA (below knee amputation), right ([place]) 03/21/2022   Ischemic  cardiomyopathy    AKI (acute kidney injury) ([place]) 03/13/2022   Streptococcal bacteremia 03/13/2022   Hypophosphatemia 03/13/2022   Septic shock ([place]) 11/57/2620   Acute systolic heart failure ([place]) 12/28/2020   Acute pulmonary edema (HCC) 10/11/2020   Non-ST elevation (NSTEMI) myocardial infarction ([place]) 10/11/2020   Acute respiratory distress 10/11/2020   Acute on chronic combined systolic and diastolic CHF (congestive heart failure) ([place]) 10/11/2020   Cardiogenic shock (HCC)    Partial nontraumatic amputation of foot, right ([place]) 06/21/2019   Osteomyelitis of right foot ([place])    Essential hypertension, benign 02/06/2019   Hypercholesteremia 02/06/2019   Stage 3b chronic kidney disease (CKD) ([place]) 10/06/2017   Proteinuria 10/06/2017   Controlled type 2 diabetes mellitus with hyperglycemia, with long-term current use of insulin ([place]) 10/06/2017   Coronary artery disease involving native coronary artery of native heart without angina pectoris 05/23/2017   Abnormal nuclear stress test 09/06/2016   PAD (peripheral artery disease) ([place]) 10/28/2012   Morbid obesity with BMI of 40.0-44.9, adult ([place]) 10/13/2012   Diabetes mellitus with ophthalmic complication ([place]) 35/59/7416   Hypertension associated with diabetes ([place]) 10/13/2012   Atherosclerosis of native artery of extremity with intermittent claudication ([place]) 04/05/2012   Adenomatous colon polyp 09/28/2011   Diabetes mellitus ([place]) 09/28/2011   Pure hyperglyceridemia 09/28/2011   Erectile dysfunction 09/28/2011   Diabetic retinopathy ([place]) 09/28/2011   Past Medical History:  Diagnosis Date   CHF (congestive heart failure) ([place])    Chronic kidney disease    stage 3   Colon polyp    Coronary artery disease    Diabetes mellitus 1987   under care of Dr. Chalmers Cater.  On insulin since 96 (off and on)   Diabetic retinopathy    Dupuytren contracture    R hand, s/p injection (Dr. Lenon Curt)   Essential hypertension, benign    Essential  hypertension, benign 02/06/2019   Frequency of urination and polyuria    Hypertension    Myocardial infarction [facility])    denies   Neuromuscular disorder ([place])    Diabetic neuropathy   Osteomyelitis ([place])    right foot   Other testicular hypofunction    Peripheral arterial disease ([place]) 10/28/2012   Peritoneal abscess ([place]) 6/08   and buttock.   Pneumonia    Polydipsia    Proteinuria    Pure hyperglyceridemia    Subacute osteomyelitis, right ankle and foot (HCC)    Wears glasses     Family History  Problem Relation Age of Onset   Diabetes Mother    Hearing loss  Mother    Hypertension Mother    Hyperlipidemia Mother    Heart disease Mother    Varicose Veins Mother    Varicose Veins Father    Dementia Father    Hyperlipidemia Brother    Diabetes Maternal Grandmother     Past Surgical History:  Procedure Laterality Date   ABDOMINAL AORTAGRAM N/A 04/18/2012   Procedure: ABDOMINAL Maxcine Ham;  Surgeon: Angelia Mould, MD;  Location: [facility] CATH LAB;  Service: Cardiovascular;  Laterality: N/A;   AMPUTATION Right 05/19/2019   Procedure: RIGHT FOOT 5TH RAY AMPUTATION;  Surgeon: Newt Minion, MD;  Location: [place];  Service: Orthopedics;  Laterality: Right;   AMPUTATION Right 03/11/2022   Procedure: RIGHT LEG DEBRIDEMENT VS. BELOW KNEE AMPUTATION;  Surgeon: Newt Minion, MD;  Location: [place];  Service: Orthopedics;  Laterality: Right;   CARDIAC CATHETERIZATION N/A 09/08/2016   Procedure: Left Heart Cath and Coronary Angiography;  Surgeon: Adrian Prows, MD;  Location: [place] CV LAB;  Service: Cardiovascular;  Laterality: N/A;   CATARACT EXTRACTION, BILATERAL  09/2017, 10/2017   Dr. Herbert Deaner   COLONOSCOPY W/ BIOPSIES AND POLYPECTOMY     CORONARY/GRAFT ACUTE MI REVASCULARIZATION N/A 10/11/2020   Procedure: Coronary/Graft Acute MI Revascularization;  Surgeon: Adrian Prows, MD;  Location: [place] CV LAB;  Service: Cardiovascular;  Laterality: N/A;   I & D EXTREMITY Right 03/11/2022    Procedure: BELOW KNEE AMPUTATION;  Surgeon: Newt Minion, MD;  Location: [place];  Service: Orthopedics;  Laterality: Right;   LEFT HEART CATH N/A 12/31/2020   Procedure: Left Heart Cath;  Surgeon: Adrian Prows, MD;  Location: [place] CV LAB;  Service: Cardiovascular;  Laterality: N/A;   LEFT HEART CATH AND CORONARY ANGIOGRAPHY N/A 10/11/2020   Procedure: LEFT HEART CATH AND CORONARY ANGIOGRAPHY;  Surgeon: Adrian Prows, MD;  Location: [place] CV LAB;  Service: Cardiovascular;  Laterality: N/A;   LOWER EXTREMITY ANGIOGRAM Bilateral 04/18/2012   Procedure: LOWER EXTREMITY ANGIOGRAM;  Surgeon: Angelia Mould, MD;  Location: [facility] CATH LAB;  Service: Cardiovascular;  Laterality: Bilateral;  bilat lower extrem angio   LOWER EXTREMITY ANGIOGRAPHY Bilateral 05/02/2019   Procedure: LOWER EXTREMITY ANGIOGRAPHY;  Surgeon: Adrian Prows, MD;  Location: [place] CV LAB;  Service: Cardiovascular;  Laterality: Bilateral;   LOWER EXTREMITY ANGIOGRAPHY Bilateral 02/28/2019   Procedure: LOWER EXTREMITY ANGIOGRAPHY;  Surgeon: Adrian Prows, MD;  Location: [place] CV LAB;  Service: Cardiovascular;  Laterality: Bilateral;   macular photocoagulation     (eye treatments for diabetic retinopathy)-Dr. Zigmund Daniel   PERIPHERAL VASCULAR INTERVENTION  02/28/2019   Procedure: PERIPHERAL VASCULAR INTERVENTION;  Surgeon: Adrian Prows, MD;  Location: [place] CV LAB;  Service: Cardiovascular;;   TEE WITHOUT CARDIOVERSION N/A 03/18/2022   Procedure: TRANSESOPHAGEAL ECHOCARDIOGRAM (TEE);  Surgeon: Adrian Prows, MD;  Location: [facility] ENDOSCOPY;  Service: Cardiovascular;  Laterality: N/A;   VENTRICULAR ASSIST DEVICE INSERTION N/A 12/31/2020   Procedure: VENTRICULAR ASSIST DEVICE INSERTION;  Surgeon: Adrian Prows, MD;  Location: [place] CV LAB;  Service: Cardiovascular;  Laterality: N/A;   Social History   Occupational History   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

## 2022-10-16 ENCOUNTER — Ambulatory Visit: Payer: Medicare Other | Admitting: Family

## 2022-10-16 ENCOUNTER — Ambulatory Visit: Payer: Self-pay

## 2022-10-16 ENCOUNTER — Encounter: Payer: Self-pay | Admitting: Family

## 2022-10-16 DIAGNOSIS — Z89511 Acquired absence of right leg below knee: Secondary | ICD-10-CM

## 2022-10-16 DIAGNOSIS — W19XXXA Unspecified fall, initial encounter: Secondary | ICD-10-CM

## 2022-10-16 MED ORDER — TRAMADOL HCL 50 MG PO TABS: 50.0000 mg | ORAL_TABLET | Freq: Four times a day (QID) | ORAL | 0 refills | Status: AC | PRN

## 2022-10-16 NOTE — Addendum Note (Signed)
Addended by: Dondra Prader R on: 10/16/2022 11:41 AM   Modules accepted: Orders

## 2022-10-16 NOTE — Progress Notes (Signed)
Office Visit Note   Patient: Eduardo [name]           Date of Birth: 08-27-1956           MRN: [id number] Visit Date: 10/16/2022              Requested by: Rita Ohara, [place],  [place] [address] PCP: Rita Ohara, MD  Chief Complaint  Patient presents with   Right Leg - Pain    [place] on right BKA last night      HPI: The patient is a 66 year old gentleman seen status post right below-knee amputation this is remote he had a fall just last night landing on his residual limb without his prosthesis in place he is having exquisite tenderness of his distal fibula today.  Minimal edema he had quite a bit of difficulty getting his prosthesis on and opted not to keep it in place today.  Assessment & Plan: Visit Diagnoses:  1. Fall, initial encounter   2. S/P below knee amputation, right ([place])     Plan: Reassurance provided.  He will resume his prosthesis when he is able no precautions.  There is no fracture.  Follow-Up Instructions: No follow-ups on file.   Ortho Exam  Patient is alert, oriented, no adenopathy, well-dressed, normal affect, normal respiratory effort. On examination of the right residual limb the skin is well healed the limb is well consolidated there is no erythema no ecchymosis he does have point tenderness to the distal fibula.  The distal tibia is minimally tender.  Imaging: No results found. No images are attached to the encounter.  Labs: Lab Results  Component Value Date   HGBA1C 6.6 06/23/2022   HGBA1C 7.3 (H) 03/11/2022   HGBA1C 6.4 12/23/2021   REPTSTATUS 03/17/2022 FINAL 03/12/2022   GRAMSTAIN  09/16/2021    FEW WBC PRESENT, PREDOMINANTLY MONONUCLEAR RARE GRAM POSITIVE COCCI FEW GRAM NEGATIVE RODS    CULT  03/12/2022    NO GROWTH 5 DAYS Performed at [facility], [place] [address], [place], [place] [address]    LABORGA STREPTOCOCCUS ANGINOSIS 03/08/2022     Lab Results  Component Value Date   ALBUMIN 3.6 (L)  04/08/2022   ALBUMIN 2.6 (L) 03/23/2022   ALBUMIN 2.1 (L) 03/11/2022   PREALBUMIN 6.5 (L) 03/11/2022    Lab Results  Component Value Date   MG 2.2 03/11/2022   MG 2.2 03/10/2022   MG 2.0 03/09/2022   Lab Results  Component Value Date   VD25OH 75.05 03/11/2022   VD25OH 54.2 12/05/2021    Lab Results  Component Value Date   PREALBUMIN 6.5 (L) 03/11/2022      Latest Ref Rng & Units 09/25/2022   12:24 PM 04/17/2022   11:35 AM 04/08/2022    1:26 PM  CBC EXTENDED  WBC 3.4 - 10.8 x10E3/uL 8.6  6.3  5.7   RBC 4.14 - 5.80 x10E6/uL 4.49  3.60  3.91   Hemoglobin 13.0 - 17.7 g/dL 13.4  11.0  11.8   HCT 37.5 - 51.0 % 41.1  33.4  35.6   Platelets 150 - 450 x10E3/uL 185  236  196   NEUT# 1.4 - 7.0 x10E3/uL 6.3  4.3  3.3   Lymph# 0.7 - 3.1 x10E3/uL 1.3  1.3  1.5      There is no height or weight on file to calculate BMI.  Orders:  Orders Placed This Encounter  Procedures   XR Knee 1-2 Views Right  No orders of the defined types were placed in this encounter.    Procedures: No procedures performed  Clinical Data: No additional findings.  ROS:  All other systems negative, except as noted in the HPI. Review of Systems  Objective: Vital Signs: There were no vitals taken for this visit.  Specialty Comments:  No specialty comments available.  PMFS History: Patient Active Problem List   Diagnosis Date Noted   Phantom pain after amputation of lower extremity ([place]) 09/18/2022   Chronic post-operative pain 09/18/2022   Abnormality of gait 09/18/2022   LBBB (left bundle branch block) 09/32/3557   Chronic systolic heart failure ([place]) 08/19/2022   Chronic right-sided congestive heart failure ([place]) 05/18/2022   Ejection fraction < 50% 04/17/2022   Insomnia    Constipation    S/P BKA (below knee amputation), right ([place]) 03/21/2022   Ischemic cardiomyopathy    AKI (acute kidney injury) ([place]) 03/13/2022   Streptococcal bacteremia 03/13/2022   Hypophosphatemia 03/13/2022    Septic shock ([place]) 32/20/2542   Acute systolic heart failure ([place]) 12/28/2020   Acute pulmonary edema (HCC) 10/11/2020   Non-ST elevation (NSTEMI) myocardial infarction ([place]) 10/11/2020   Acute respiratory distress 10/11/2020   Acute on chronic combined systolic and diastolic CHF (congestive heart failure) ([place]) 10/11/2020   Cardiogenic shock (HCC)    Partial nontraumatic amputation of foot, right ([place]) 06/21/2019   Osteomyelitis of right foot ([place])    Essential hypertension, benign 02/06/2019   Hypercholesteremia 02/06/2019   Stage 3b chronic kidney disease (CKD) ([place]) 10/06/2017   Proteinuria 10/06/2017   Controlled type 2 diabetes mellitus with hyperglycemia, with long-term current use of insulin ([place]) 10/06/2017   Coronary artery disease involving native coronary artery of native heart without angina pectoris 05/23/2017   Abnormal nuclear stress test 09/06/2016   PAD (peripheral artery disease) ([place]) 10/28/2012   Morbid obesity with BMI of 40.0-44.9, adult ([place]) 10/13/2012   Diabetes mellitus with ophthalmic complication ([place]) 70/62/3762   Hypertension associated with diabetes ([place]) 10/13/2012   Atherosclerosis of native artery of extremity with intermittent claudication ([place]) 04/05/2012   Adenomatous colon polyp 09/28/2011   Diabetes mellitus ([place]) 09/28/2011   Pure hyperglyceridemia 09/28/2011   Erectile dysfunction 09/28/2011   Diabetic retinopathy ([place]) 09/28/2011   Past Medical History:  Diagnosis Date   CHF (congestive heart failure) ([place])    Chronic kidney disease    stage 3   Colon polyp    Coronary artery disease    Diabetes mellitus 1987   under care of Dr. Chalmers Cater.  On insulin since 96 (off and on)   Diabetic retinopathy    Dupuytren contracture    R hand, s/p injection (Dr. Lenon Curt)   Essential hypertension, benign    Essential hypertension, benign 02/06/2019   Frequency of urination and polyuria    Hypertension    Myocardial infarction [facility])    denies    Neuromuscular disorder ([place])    Diabetic neuropathy   Osteomyelitis ([place])    right foot   Other testicular hypofunction    Peripheral arterial disease ([place]) 10/28/2012   Peritoneal abscess ([place]) 6/08   and buttock.   Pneumonia    Polydipsia    Proteinuria    Pure hyperglyceridemia    Subacute osteomyelitis, right ankle and foot (HCC)    Wears glasses     Family History  Problem Relation Age of Onset   Diabetes Mother    Hearing loss Mother    Hypertension Mother    Hyperlipidemia Mother  Heart disease Mother    Varicose Veins Mother    Varicose Veins Father    Dementia Father    Hyperlipidemia Brother    Diabetes Maternal Grandmother     Past Surgical History:  Procedure Laterality Date   ABDOMINAL AORTAGRAM N/A 04/18/2012   Procedure: ABDOMINAL Maxcine Ham;  Surgeon: Angelia Mould, MD;  Location: [facility] CATH LAB;  Service: Cardiovascular;  Laterality: N/A;   AMPUTATION Right 05/19/2019   Procedure: RIGHT FOOT 5TH RAY AMPUTATION;  Surgeon: Newt Minion, MD;  Location: [place];  Service: Orthopedics;  Laterality: Right;   AMPUTATION Right 03/11/2022   Procedure: RIGHT LEG DEBRIDEMENT VS. BELOW KNEE AMPUTATION;  Surgeon: Newt Minion, MD;  Location: [place];  Service: Orthopedics;  Laterality: Right;   CARDIAC CATHETERIZATION N/A 09/08/2016   Procedure: Left Heart Cath and Coronary Angiography;  Surgeon: Adrian Prows, MD;  Location: [place] CV LAB;  Service: Cardiovascular;  Laterality: N/A;   CATARACT EXTRACTION, BILATERAL  09/2017, 10/2017   Dr. Herbert Deaner   COLONOSCOPY W/ BIOPSIES AND POLYPECTOMY     CORONARY/GRAFT ACUTE MI REVASCULARIZATION N/A 10/11/2020   Procedure: Coronary/Graft Acute MI Revascularization;  Surgeon: Adrian Prows, MD;  Location: [place] CV LAB;  Service: Cardiovascular;  Laterality: N/A;   I & D EXTREMITY Right 03/11/2022   Procedure: BELOW KNEE AMPUTATION;  Surgeon: Newt Minion, MD;  Location: [place];  Service: Orthopedics;  Laterality: Right;   LEFT  HEART CATH N/A 12/31/2020   Procedure: Left Heart Cath;  Surgeon: Adrian Prows, MD;  Location: [place] CV LAB;  Service: Cardiovascular;  Laterality: N/A;   LEFT HEART CATH AND CORONARY ANGIOGRAPHY N/A 10/11/2020   Procedure: LEFT HEART CATH AND CORONARY ANGIOGRAPHY;  Surgeon: Adrian Prows, MD;  Location: [place] CV LAB;  Service: Cardiovascular;  Laterality: N/A;   LOWER EXTREMITY ANGIOGRAM Bilateral 04/18/2012   Procedure: LOWER EXTREMITY ANGIOGRAM;  Surgeon: Angelia Mould, MD;  Location: [facility] CATH LAB;  Service: Cardiovascular;  Laterality: Bilateral;  bilat lower extrem angio   LOWER EXTREMITY ANGIOGRAPHY Bilateral 05/02/2019   Procedure: LOWER EXTREMITY ANGIOGRAPHY;  Surgeon: Adrian Prows, MD;  Location: [place] CV LAB;  Service: Cardiovascular;  Laterality: Bilateral;   LOWER EXTREMITY ANGIOGRAPHY Bilateral 02/28/2019   Procedure: LOWER EXTREMITY ANGIOGRAPHY;  Surgeon: Adrian Prows, MD;  Location: [place] CV LAB;  Service: Cardiovascular;  Laterality: Bilateral;   macular photocoagulation     (eye treatments for diabetic retinopathy)-Dr. Zigmund Daniel   PERIPHERAL VASCULAR INTERVENTION  02/28/2019   Procedure: PERIPHERAL VASCULAR INTERVENTION;  Surgeon: Adrian Prows, MD;  Location: [place] CV LAB;  Service: Cardiovascular;;   TEE WITHOUT CARDIOVERSION N/A 03/18/2022   Procedure: TRANSESOPHAGEAL ECHOCARDIOGRAM (TEE);  Surgeon: Adrian Prows, MD;  Location: [facility] ENDOSCOPY;  Service: Cardiovascular;  Laterality: N/A;   VENTRICULAR ASSIST DEVICE INSERTION N/A 12/31/2020   Procedure: VENTRICULAR ASSIST DEVICE INSERTION;  Surgeon: Adrian Prows, MD;  Location: [place] CV LAB;  Service: Cardiovascular;  Laterality: N/A;   Social History   Occupational History   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

## 2022-10-18 DIAGNOSIS — E1165 Type 2 diabetes mellitus with hyperglycemia: Secondary | ICD-10-CM | POA: Diagnosis not present

## 2022-10-20 NOTE — Pre-Procedure Instructions (Signed)
Instructed patient on the following items: Arrival time 0830 Nothing to eat or drink after midnight No meds AM of procedure Responsible person to drive you home and stay with you for 24 hrs Wash with special soap night before and morning of procedure If on anti-coagulant drug instructions Plavix- last dose 12/10

## 2022-10-21 ENCOUNTER — Ambulatory Visit (HOSPITAL_COMMUNITY)
Admission: RE | Admit: 2022-10-21 | Discharge: 2022-10-21 | Disposition: A | Discharge status: Home / Self Care | Payer: Medicare Other | Attending: Internal Medicine | Admitting: Internal Medicine

## 2022-10-21 ENCOUNTER — Ambulatory Visit (HOSPITAL_COMMUNITY): Payer: Medicare Other

## 2022-10-21 ENCOUNTER — Other Ambulatory Visit: Payer: Self-pay

## 2022-10-21 ENCOUNTER — Encounter (HOSPITAL_COMMUNITY)
Admission: RE | Disposition: A | Discharge status: Home / Self Care | Payer: Self-pay | Source: Home / Self Care | Attending: Internal Medicine

## 2022-10-21 DIAGNOSIS — I447 Left bundle-branch block, unspecified: Secondary | ICD-10-CM | POA: Insufficient documentation

## 2022-10-21 DIAGNOSIS — I5022 Chronic systolic (congestive) heart failure: Secondary | ICD-10-CM | POA: Diagnosis not present

## 2022-10-21 DIAGNOSIS — I428 Other cardiomyopathies: Secondary | ICD-10-CM | POA: Insufficient documentation

## 2022-10-21 DIAGNOSIS — E1151 Type 2 diabetes mellitus with diabetic peripheral angiopathy without gangrene: Secondary | ICD-10-CM | POA: Diagnosis not present

## 2022-10-21 DIAGNOSIS — I429 Cardiomyopathy, unspecified: Secondary | ICD-10-CM | POA: Diagnosis not present

## 2022-10-21 DIAGNOSIS — E1122 Type 2 diabetes mellitus with diabetic chronic kidney disease: Secondary | ICD-10-CM | POA: Diagnosis not present

## 2022-10-21 DIAGNOSIS — Z794 Long term (current) use of insulin: Secondary | ICD-10-CM | POA: Diagnosis not present

## 2022-10-21 DIAGNOSIS — I480 Paroxysmal atrial fibrillation: Secondary | ICD-10-CM | POA: Diagnosis not present

## 2022-10-21 DIAGNOSIS — N182 Chronic kidney disease, stage 2 (mild): Secondary | ICD-10-CM | POA: Diagnosis not present

## 2022-10-21 DIAGNOSIS — Z6835 Body mass index (BMI) 35.0-35.9, adult: Secondary | ICD-10-CM | POA: Diagnosis not present

## 2022-10-21 DIAGNOSIS — Z87891 Personal history of nicotine dependence: Secondary | ICD-10-CM | POA: Diagnosis not present

## 2022-10-21 DIAGNOSIS — E669 Obesity, unspecified: Secondary | ICD-10-CM | POA: Diagnosis not present

## 2022-10-21 DIAGNOSIS — Z95810 Presence of automatic (implantable) cardiac defibrillator: Secondary | ICD-10-CM | POA: Diagnosis not present

## 2022-10-21 DIAGNOSIS — I2510 Atherosclerotic heart disease of native coronary artery without angina pectoris: Secondary | ICD-10-CM | POA: Insufficient documentation

## 2022-10-21 DIAGNOSIS — Z89511 Acquired absence of right leg below knee: Secondary | ICD-10-CM | POA: Diagnosis not present

## 2022-10-21 DIAGNOSIS — I130 Hypertensive heart and chronic kidney disease with heart failure and stage 1 through stage 4 chronic kidney disease, or unspecified chronic kidney disease: Secondary | ICD-10-CM | POA: Diagnosis not present

## 2022-10-21 HISTORY — PX: BIV ICD INSERTION CRT-D: EP1195

## 2022-10-21 LAB — GLUCOSE, CAPILLARY
Glucose-Capillary: 169 mg/dL — ABNORMAL HIGH (ref 70–99)
Glucose-Capillary: 182 mg/dL — ABNORMAL HIGH (ref 70–99)

## 2022-10-21 SURGERY — BIV ICD INSERTION CRT-D

## 2022-10-21 MED ORDER — POVIDONE-IODINE 10 % EX SWAB: 2.0000 | Freq: Once | CUTANEOUS | Status: DC

## 2022-10-21 MED ORDER — LIDOCAINE HCL (PF) 1 % IJ SOLN
INTRAMUSCULAR | Status: DC | PRN
  Administered 2022-10-21: 60 mL via INTRADERMAL

## 2022-10-21 MED ORDER — ACETAMINOPHEN 325 MG PO TABS: 325.0000 mg | ORAL_TABLET | ORAL | Status: DC | PRN

## 2022-10-21 MED ORDER — MIDAZOLAM HCL 5 MG/5ML IJ SOLN
INTRAMUSCULAR | Status: DC | PRN
  Administered 2022-10-21 (×3): 1 mg via INTRAVENOUS

## 2022-10-21 MED ORDER — IODIXANOL 320 MG/ML IV SOLN
INTRAVENOUS | Status: DC | PRN
  Administered 2022-10-21: 5 mL

## 2022-10-21 MED ORDER — LIDOCAINE HCL (PF) 1 % IJ SOLN
INTRAMUSCULAR | Status: AC
  Filled 2022-10-21: qty 60

## 2022-10-21 MED ORDER — CEFAZOLIN SODIUM-DEXTROSE 1-4 GM/50ML-% IV SOLN
1.0000 g | Freq: Once | INTRAVENOUS | Status: AC
  Administered 2022-10-21: 1 g via INTRAVENOUS
  Filled 2022-10-21: qty 50

## 2022-10-21 MED ORDER — SODIUM CHLORIDE 0.9 % IV SOLN: INTRAVENOUS | Status: DC

## 2022-10-21 MED ORDER — HEPARIN (PORCINE) IN NACL 1000-0.9 UT/500ML-% IV SOLN
INTRAVENOUS | Status: DC | PRN
  Administered 2022-10-21: 500 mL

## 2022-10-21 MED ORDER — SODIUM CHLORIDE 0.9 % IV SOLN
80.0000 mg | INTRAVENOUS | Status: AC
  Administered 2022-10-21: 80 mg

## 2022-10-21 MED ORDER — FENTANYL CITRATE (PF) 100 MCG/2ML IJ SOLN
INTRAMUSCULAR | Status: AC
  Filled 2022-10-21: qty 2

## 2022-10-21 MED ORDER — CEFAZOLIN SODIUM-DEXTROSE 2-4 GM/100ML-% IV SOLN
INTRAVENOUS | Status: AC
  Filled 2022-10-21: qty 100

## 2022-10-21 MED ORDER — FENTANYL CITRATE (PF) 100 MCG/2ML IJ SOLN
INTRAMUSCULAR | Status: DC | PRN
  Administered 2022-10-21 (×2): 12.5 ug via INTRAVENOUS

## 2022-10-21 MED ORDER — SODIUM CHLORIDE 0.9 % IV SOLN
INTRAVENOUS | Status: AC
  Filled 2022-10-21: qty 2

## 2022-10-21 MED ORDER — CEFAZOLIN SODIUM-DEXTROSE 2-4 GM/100ML-% IV SOLN
2.0000 g | INTRAVENOUS | Status: AC
  Administered 2022-10-21: 2 g via INTRAVENOUS

## 2022-10-21 MED ORDER — CHLORHEXIDINE GLUCONATE 4 % EX LIQD
4.0000 | Freq: Once | CUTANEOUS | Status: DC
  Filled 2022-10-21: qty 60

## 2022-10-21 MED ORDER — ONDANSETRON HCL 4 MG/2ML IJ SOLN: 4.0000 mg | Freq: Four times a day (QID) | INTRAMUSCULAR | Status: DC | PRN

## 2022-10-21 MED ORDER — MIDAZOLAM HCL 5 MG/5ML IJ SOLN
INTRAMUSCULAR | Status: AC
  Filled 2022-10-21: qty 5

## 2022-10-21 SURGICAL SUPPLY — 15 items
CABLE SURGICAL S-101-97-12 (CABLE) ×1 IMPLANT
CATH CPS DIRECT 135 DS2C020 (CATHETERS) IMPLANT
CATH JSN HEX 2-5-2 120 (CATHETERS) IMPLANT
CPS IMPLANT KIT 410190 (MISCELLANEOUS) IMPLANT
DRAPE ZERO GRAVITY STERILE (DRAPES) IMPLANT
ICD GALLANT HFCRTD CDHFA500Q (ICD Generator) IMPLANT
LEAD DURATA 7122Q-65CM (Lead) IMPLANT
LEAD QUARTET 1458QL-86 (Lead) IMPLANT
LEAD ULTIPACE 52 LPA1231/52 (Lead) IMPLANT
PAD DEFIB RADIO PHYSIO CONN (PAD) ×1 IMPLANT
QUARTET 1458QL-86 (Lead) ×1 IMPLANT
SHEATH 7FR PRELUDE SNAP 13 (SHEATH) IMPLANT
SLITTER AGILIS HISPRO (INSTRUMENTS) IMPLANT
TRAY PACEMAKER INSERTION (PACKS) ×1 IMPLANT
WIRE ACUITY WHISPER EDS 4648 (WIRE) IMPLANT

## 2022-10-21 NOTE — Progress Notes (Signed)
Pressure dressing removed per Dr. Lovena Le prior to D/C.

## 2022-10-21 NOTE — Progress Notes (Signed)
Patients CXR is normal with no pneumothorax. According to Dr. Lovena Le, patient can be discharged.

## 2022-10-21 NOTE — Interval H&P Note (Signed)
History and Physical Interval Note:  10/21/2022 10:41 AM  Eduardo [name]  has presented today for surgery, with the diagnosis of cardiomyopathy.  The various methods of treatment have been discussed with the patient and family. After consideration of risks, benefits and other options for treatment, the patient has consented to  Procedure(s): BIV ICD INSERTION CRT-D (N/A) as a surgical intervention.  The patient's history has been reviewed, patient examined, no change in status, stable for surgery.  I have reviewed the patient's chart and labs.  Questions were answered to the patient's satisfaction.     Cristopher Peru

## 2022-10-21 NOTE — Progress Notes (Signed)
MD gave verbal order for patient to have portable 1 view CXR since patient has R BKA. MD stated that as long as the CXR shows no pneumothorax the patient can be discharged.

## 2022-10-21 NOTE — Discharge Instructions (Signed)
After Your ICD (Implantable Cardiac Defibrillator)   You have a Abbott ICD  ACTIVITY Do not lift your arm above shoulder height for 1 week after your procedure. After 7 days, you may progress as below.  You should remove your sling 24 hours after your procedure, unless otherwise instructed by your provider.     Wednesday October 28, 2022  Thursday October 29, 2022 Friday October 30, 2022 Saturday October 31, 2022   Do not lift, push, pull, or carry anything over 10 pounds with the affected arm until 6 weeks (Wednesday December 02, 2022 ) after your procedure.   You may drive AFTER your wound check, unless you have been told otherwise by your provider.   Ask your healthcare provider when you can go back to work   INCISION/Dressing If you are on a blood thinner such as Coumadin, Xarelto, Eliquis, Plavix, or Pradaxa please confirm with your provider when this should be resumed.   If large square, outer bandage is left in place, this can be removed after 24 hours from your procedure. Do not remove steri-strips or glue as below.   Monitor your defibrillator site for redness, swelling, and drainage. Call the device clinic at [phone] if you experience these symptoms or fever/chills.  If your incision is sealed with Steri-strips or staples, you may shower 7 days after your procedure or when told by your provider. Do not remove the steri-strips or let the shower hit directly on your site. You may wash around your site with soap and water.    If you were discharged in a sling, please do not wear this during the day more than 48 hours after your surgery unless otherwise instructed. This may increase the risk of stiffness and soreness in your shoulder.   Avoid lotions, ointments, or perfumes over your incision until it is well-healed.  You may use a hot tub or a pool AFTER your wound check appointment if the incision is completely closed.  Your ICD is designed to protect you from life  threatening heart rhythms. Because of this, you may receive a shock.   1 shock with no symptoms:  Call the office during business hours. 1 shock with symptoms (chest pain, chest pressure, dizziness, lightheadedness, shortness of breath, overall feeling unwell):  Call 911. If you experience 2 or more shocks in 24 hours:  Call 911. If you receive a shock, you should not drive for 6 months per the [place] DMV IF you receive appropriate therapy from your ICD.   ICD Alerts:  Some alerts are vibratory and others beep. These are NOT emergencies. Please call our office to let us know. If this occurs at night or on weekends, it can wait until the next business day. Send a remote transmission.  If your device is capable of reading fluid status (for heart failure), you will be offered monthly monitoring to review this with you.   DEVICE MANAGEMENT Remote monitoring is used to monitor your ICD from home. This monitoring is scheduled every 91 days by our office. It allows [place] to keep an eye on the functioning of your device to ensure it is working properly. You will routinely see your Electrophysiologist annually (more often if necessary).   You should receive your ID card for your new device in 4-8 weeks. Keep this card with you at all times once received. Consider wearing a medical alert bracelet or necklace.  Your ICD  may be MRI compatible. This will be discussed at your next  office visit/wound check.  You should avoid contact with strong electric or magnetic fields.   Do not use amateur (ham) radio equipment or electric (arc) welding torches. MP3 player headphones with magnets should not be used. Some devices are safe to use if held at least 12 inches (30 cm) from your defibrillator. These include power tools, lawn mowers, and speakers. If you are unsure if something is safe to use, ask your health care provider.  When using your cell phone, hold it to the ear that is on the opposite side from the  defibrillator. Do not leave your cell phone in a pocket over the defibrillator.  You may safely use electric blankets, heating pads, computers, and microwave ovens.  Call the office right away if: You have chest pain. You feel more than one shock. You feel more short of breath than you have felt before. You feel more light-headed than you have felt before. Your incision starts to open up.  This information is not intended to replace advice given to you by your health care provider. Make sure you discuss any questions you have with your health care provider.

## 2022-10-22 ENCOUNTER — Encounter (HOSPITAL_COMMUNITY): Payer: Self-pay | Admitting: Internal Medicine

## 2022-10-22 ENCOUNTER — Telehealth: Payer: Self-pay

## 2022-10-22 NOTE — Telephone Encounter (Signed)
Follow-up after same day discharge: Implant date: 10/21/2022 MD: Cristopher Peru, MD Device: Abbott [id number] [place] HF Location: Left Chest   Wound check visit: 11/04/2022 @ 12:00 PM 90 day MD follow-up: 02/04/2023 @ 2:45 PM   Dressing/sling removed: Will remove tonight  Confirm OAC restart on: N/A

## 2022-10-22 NOTE — Telephone Encounter (Signed)
-----   Message from Shirley Friar, [place] sent at 10/21/2022  3:44 PM EST ----- Regarding: Same Day Discharge ICD 10/21/22 Dr. Lovena Le

## 2022-10-23 ENCOUNTER — Encounter: Payer: Self-pay | Admitting: Physical Medicine and Rehabilitation

## 2022-10-30 DIAGNOSIS — Z89511 Acquired absence of right leg below knee: Secondary | ICD-10-CM | POA: Diagnosis not present

## 2022-10-30 DIAGNOSIS — T8743 Infection of amputation stump, right lower extremity: Secondary | ICD-10-CM | POA: Diagnosis not present

## 2022-10-30 DIAGNOSIS — I255 Ischemic cardiomyopathy: Secondary | ICD-10-CM | POA: Diagnosis not present

## 2022-11-04 ENCOUNTER — Ambulatory Visit: Payer: Medicare Other | Attending: Interventional Cardiology

## 2022-11-04 DIAGNOSIS — I5022 Chronic systolic (congestive) heart failure: Secondary | ICD-10-CM | POA: Diagnosis not present

## 2022-11-04 DIAGNOSIS — I447 Left bundle-branch block, unspecified: Secondary | ICD-10-CM

## 2022-11-04 LAB — CUP PACEART INCLINIC DEVICE CHECK
Battery Remaining Longevity: 58 mo
Brady Statistic RA Percent Paced: 3.2 %
Brady Statistic RV Percent Paced: 97 %
Date Time Interrogation Session: 20231227124005
HighPow Impedance: 54 Ohm
Implantable Lead Connection Status: 753985
Implantable Lead Connection Status: 753985
Implantable Lead Connection Status: 753985
Implantable Lead Implant Date: 20231213
Implantable Lead Implant Date: 20231213
Implantable Lead Implant Date: 20231213
Implantable Lead Location: 753858
Implantable Lead Location: 753859
Implantable Lead Location: 753860
Implantable Pulse Generator Implant Date: 20231213
Lead Channel Impedance Value: 387.5 Ohm
Lead Channel Impedance Value: 425 Ohm
Lead Channel Impedance Value: 575 Ohm
Lead Channel Pacing Threshold Amplitude: 0.5 V
Lead Channel Pacing Threshold Amplitude: 0.5 V
Lead Channel Pacing Threshold Amplitude: 0.75 V
Lead Channel Pacing Threshold Amplitude: 0.75 V
Lead Channel Pacing Threshold Amplitude: 1 V
Lead Channel Pacing Threshold Amplitude: 1 V
Lead Channel Pacing Threshold Pulse Width: 0.5 ms
Lead Channel Pacing Threshold Pulse Width: 0.5 ms
Lead Channel Pacing Threshold Pulse Width: 0.5 ms
Lead Channel Pacing Threshold Pulse Width: 0.5 ms
Lead Channel Pacing Threshold Pulse Width: 0.5 ms
Lead Channel Pacing Threshold Pulse Width: 0.5 ms
Lead Channel Sensing Intrinsic Amplitude: 11.4 mV
Lead Channel Sensing Intrinsic Amplitude: 2.7 mV
Lead Channel Setting Pacing Amplitude: 3.5 V
Lead Channel Setting Pacing Amplitude: 3.5 V
Lead Channel Setting Pacing Amplitude: 3.5 V
Lead Channel Setting Pacing Pulse Width: 0.5 ms
Lead Channel Setting Pacing Pulse Width: 0.5 ms
Lead Channel Setting Sensing Sensitivity: 0.5 mV
Pulse Gen Serial Number: 211000872

## 2022-11-04 NOTE — Patient Instructions (Signed)

## 2022-11-04 NOTE — Progress Notes (Signed)

## 2022-11-16 ENCOUNTER — Other Ambulatory Visit: Payer: Self-pay | Admitting: Internal Medicine

## 2022-11-18 DIAGNOSIS — E1165 Type 2 diabetes mellitus with hyperglycemia: Secondary | ICD-10-CM | POA: Diagnosis not present

## 2022-11-30 DIAGNOSIS — Z89511 Acquired absence of right leg below knee: Secondary | ICD-10-CM | POA: Diagnosis not present

## 2022-11-30 DIAGNOSIS — T8743 Infection of amputation stump, right lower extremity: Secondary | ICD-10-CM | POA: Diagnosis not present

## 2022-11-30 DIAGNOSIS — I255 Ischemic cardiomyopathy: Secondary | ICD-10-CM | POA: Diagnosis not present

## 2022-12-07 ENCOUNTER — Encounter (INDEPENDENT_AMBULATORY_CARE_PROVIDER_SITE_OTHER): Payer: Medicare Other | Admitting: Ophthalmology

## 2022-12-07 DIAGNOSIS — E113391 Type 2 diabetes mellitus with moderate nonproliferative diabetic retinopathy without macular edema, right eye: Secondary | ICD-10-CM

## 2022-12-07 DIAGNOSIS — E113592 Type 2 diabetes mellitus with proliferative diabetic retinopathy without macular edema, left eye: Secondary | ICD-10-CM

## 2022-12-07 DIAGNOSIS — H35033 Hypertensive retinopathy, bilateral: Secondary | ICD-10-CM | POA: Diagnosis not present

## 2022-12-07 DIAGNOSIS — H43813 Vitreous degeneration, bilateral: Secondary | ICD-10-CM

## 2022-12-07 DIAGNOSIS — I10 Essential (primary) hypertension: Secondary | ICD-10-CM | POA: Diagnosis not present

## 2022-12-09 DIAGNOSIS — I2510 Atherosclerotic heart disease of native coronary artery without angina pectoris: Secondary | ICD-10-CM | POA: Diagnosis not present

## 2022-12-09 DIAGNOSIS — E1122 Type 2 diabetes mellitus with diabetic chronic kidney disease: Secondary | ICD-10-CM | POA: Diagnosis not present

## 2022-12-09 DIAGNOSIS — Z89511 Acquired absence of right leg below knee: Secondary | ICD-10-CM | POA: Diagnosis not present

## 2022-12-09 DIAGNOSIS — N1831 Chronic kidney disease, stage 3a: Secondary | ICD-10-CM | POA: Diagnosis not present

## 2022-12-09 DIAGNOSIS — I129 Hypertensive chronic kidney disease with stage 1 through stage 4 chronic kidney disease, or unspecified chronic kidney disease: Secondary | ICD-10-CM | POA: Diagnosis not present

## 2022-12-09 DIAGNOSIS — I5022 Chronic systolic (congestive) heart failure: Secondary | ICD-10-CM | POA: Diagnosis not present

## 2022-12-09 LAB — PROTEIN / CREATININE RATIO, URINE
Albumin, U: 3.6
Creatinine, Urine: 19.5

## 2022-12-09 LAB — BASIC METABOLIC PANEL: Creatinine: 1.5 — AB (ref 0.6–1.3)

## 2022-12-09 LAB — MICROALBUMIN / CREATININE URINE RATIO: Microalb Creat Ratio: 18

## 2022-12-17 ENCOUNTER — Ambulatory Visit: Payer: Medicare Other | Admitting: Orthopedic Surgery

## 2022-12-17 ENCOUNTER — Encounter: Payer: Self-pay | Admitting: Orthopedic Surgery

## 2022-12-17 DIAGNOSIS — R21 Rash and other nonspecific skin eruption: Secondary | ICD-10-CM | POA: Diagnosis not present

## 2022-12-17 DIAGNOSIS — Z89511 Acquired absence of right leg below knee: Secondary | ICD-10-CM | POA: Diagnosis not present

## 2022-12-17 NOTE — Progress Notes (Signed)
Office Visit Note   Patient: Eduardo [name]           Date of Birth: 1956-01-08           MRN: [id number] Visit Date: 12/17/2022              Requested by: Rita Ohara, MD [address] [place],  [place] [address] PCP: Rita Ohara, MD  Chief Complaint  Patient presents with   Right Leg - Follow-up    HX BKA 03/2022      HPI: Patient is a 67 year old gentleman who is status post right below-knee amputation is currently using a prosthesis and a cane.  He is 5 months out from prosthetic fitting.  Patient complains of a rash over the dorsum of his knee.  He denies any cellulitis denies any open wounds.  Assessment & Plan: Visit Diagnoses:  1. S/P below knee amputation, right ([place])   2. Rash of periwound skin     Plan: Patient has developed a fungal dermatitis from his silicone liner and increased pressure from the subsidence into his socket.  Patient was provided a prescription for new socket liner materials and supplies.  Patient is given a stump shrinker to wear under the liner to protect the skin.  Follow-Up Instructions: Return if symptoms worsen or fail to improve.   Ortho Exam  Patient is alert, oriented, no adenopathy, well-dressed, normal affect, normal respiratory effort. Examination patient has a fungal dermatitis dorsally over the patella.  There is no knee effusion no ulcers no pressure points no callus.  Patient is currently wearing 13-16 ply sock with subsidence into his socket.  Patient is an existing right transtibial  amputee.  Patient's current comorbidities are not expected to impact the ability to function with the prescribed prosthesis. Patient verbally communicates a strong desire to use a prosthesis. Patient currently requires mobility aids to ambulate without a prosthesis.  Expects not to use mobility aids with a new prosthesis.  Patient is a K3 level ambulator that spends a lot of time walking around on uneven terrain over obstacles, up and down  stairs, and ambulates with a variable cadence.     Imaging: No results found. No images are attached to the encounter.  Labs: Lab Results  Component Value Date   HGBA1C 6.6 06/23/2022   HGBA1C 7.3 (H) 03/11/2022   HGBA1C 6.4 12/23/2021   REPTSTATUS 03/17/2022 FINAL 03/12/2022   GRAMSTAIN  09/16/2021    FEW WBC PRESENT, PREDOMINANTLY MONONUCLEAR RARE GRAM POSITIVE COCCI FEW GRAM NEGATIVE RODS    CULT  03/12/2022    NO GROWTH 5 DAYS Performed at [facility], [place] [address], [place], [place] [address]    LABORGA STREPTOCOCCUS ANGINOSIS 03/08/2022     Lab Results  Component Value Date   ALBUMIN 3.6 (L) 04/08/2022   ALBUMIN 2.6 (L) 03/23/2022   ALBUMIN 2.1 (L) 03/11/2022   PREALBUMIN 6.5 (L) 03/11/2022    Lab Results  Component Value Date   MG 2.2 03/11/2022   MG 2.2 03/10/2022   MG 2.0 03/09/2022   Lab Results  Component Value Date   VD25OH 75.05 03/11/2022   VD25OH 54.2 12/05/2021    Lab Results  Component Value Date   PREALBUMIN 6.5 (L) 03/11/2022      Latest Ref Rng & Units 09/25/2022   12:24 PM 04/17/2022   11:35 AM 04/08/2022    1:26 PM  CBC EXTENDED  WBC 3.4 - 10.8 x10E3/uL 8.6  6.3  5.7  RBC 4.14 - 5.80 x10E6/uL 4.49  3.60  3.91   Hemoglobin 13.0 - 17.7 g/dL 13.4  11.0  11.8   HCT 37.5 - 51.0 % 41.1  33.4  35.6   Platelets 150 - 450 x10E3/uL 185  236  196   NEUT# 1.4 - 7.0 x10E3/uL 6.3  4.3  3.3   Lymph# 0.7 - 3.1 x10E3/uL 1.3  1.3  1.5      There is no height or weight on file to calculate BMI.  Orders:  No orders of the defined types were placed in this encounter.  No orders of the defined types were placed in this encounter.    Procedures: No procedures performed  Clinical Data: No additional findings.  ROS:  All other systems negative, except as noted in the HPI. Review of Systems  Objective: Vital Signs: There were no vitals taken for this visit.  Specialty Comments:  No specialty comments available.  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[place]) 09/18/2022   Chronic post-operative pain 09/18/2022   Abnormality of gait 09/18/2022   LBBB (left bundle branch block) 19/14/7829   Chronic systolic heart failure ([place]) 08/19/2022   Chronic right-sided congestive heart failure ([place]) 05/18/2022   Ejection fraction < 50% 04/17/2022   Insomnia    Constipation    S/P BKA (below knee amputation), right ([place]) 03/21/2022   Ischemic cardiomyopathy    AKI (acute kidney injury) ([place]) 03/13/2022   Streptococcal bacteremia 03/13/2022   Hypophosphatemia 03/13/2022   Septic shock ([place]) 56/21/3086   Acute systolic heart failure ([place]) 12/28/2020   Acute pulmonary edema (HCC) 10/11/2020   Non-ST elevation (NSTEMI) myocardial infarction ([place]) 10/11/2020   Acute respiratory distress 10/11/2020   Acute on chronic combined systolic and diastolic CHF (congestive heart failure) ([place]) 10/11/2020   Cardiogenic shock (HCC)    Partial nontraumatic amputation of foot, right ([place]) 06/21/2019   Osteomyelitis of right foot ([place])    Essential hypertension, benign 02/06/2019   Hypercholesteremia 02/06/2019   Stage 3b chronic kidney disease (CKD) ([place]) 10/06/2017   Proteinuria 10/06/2017   Controlled type 2 diabetes mellitus with hyperglycemia, with long-term current use of insulin ([place]) 10/06/2017   Coronary artery disease involving native coronary artery of native heart without angina pectoris 05/23/2017   Abnormal nuclear stress test 09/06/2016   PAD (peripheral artery disease) ([place]) 10/28/2012   Morbid obesity with BMI of 40.0-44.9, adult ([place]) 10/13/2012   Diabetes mellitus with ophthalmic complication ([place]) 57/84/6962   Hypertension associated with diabetes ([place]) 10/13/2012   Atherosclerosis of native artery of extremity with intermittent claudication ([place]) 04/05/2012   Adenomatous colon polyp 09/28/2011   Diabetes mellitus ([place]) 09/28/2011    Pure hyperglyceridemia 09/28/2011   Erectile dysfunction 09/28/2011   Diabetic retinopathy ([place]) 09/28/2011   Past Medical History:  Diagnosis Date   CHF (congestive heart failure) ([place])    Chronic kidney disease    stage 3   Colon polyp    Coronary artery disease    Diabetes mellitus 1987   under care of Dr. Chalmers Cater.  On insulin since 96 (off and on)   Diabetic retinopathy    Dupuytren contracture    R hand, s/p injection (Dr. Lenon Curt)   Essential hypertension, benign    Essential hypertension, benign 02/06/2019   Frequency of urination and polyuria    Hypertension    Myocardial infarction [facility])    denies   Neuromuscular disorder ([place])  Diabetic neuropathy   Osteomyelitis ([place])    right foot   Other testicular hypofunction    Peripheral arterial disease ([place]) 10/28/2012   Peritoneal abscess ([place]) 6/08   and buttock.   Pneumonia    Polydipsia    Proteinuria    Pure hyperglyceridemia    Subacute osteomyelitis, right ankle and foot (HCC)    Wears glasses     Family History  Problem Relation Age of Onset   Diabetes Mother    Hearing loss Mother    Hypertension Mother    Hyperlipidemia Mother    Heart disease Mother    Varicose Veins Mother    Varicose Veins Father    Dementia Father    Hyperlipidemia Brother    Diabetes Maternal Grandmother     Past Surgical History:  Procedure Laterality Date   ABDOMINAL AORTAGRAM N/A 04/18/2012   Procedure: ABDOMINAL Maxcine Ham;  Surgeon: Angelia Mould, MD;  Location: [facility] LAB;  Service: Cardiovascular;  Laterality: N/A;   AMPUTATION Right 05/19/2019   Procedure: RIGHT FOOT 5TH RAY AMPUTATION;  Surgeon: Newt Minion, MD;  Location: [facility];  Service: Orthopedics;  Laterality: Right;   AMPUTATION Right 03/11/2022   Procedure: RIGHT LEG DEBRIDEMENT VS. BELOW KNEE AMPUTATION;  Surgeon: Newt Minion, MD;  Location: [facility];  Service: Orthopedics;  Laterality: Right;   BIV ICD INSERTION CRT-D N/A 10/21/2022   Procedure: BIV  ICD INSERTION CRT-D;  Surgeon: Evans Lance, MD;  Location: [facility] [facility] [place] CV LAB;  Service: Cardiovascular;  Laterality: N/A;   CARDIAC CATHETERIZATION N/A 09/08/2016   Procedure: Left Heart Cath and Coronary Angiography;  Surgeon: Adrian Prows, MD;  Location: [place] CV LAB;  Service: Cardiovascular;  Laterality: N/A;   CATARACT EXTRACTION, BILATERAL  09/2017, 10/2017   Dr. Herbert Deaner   COLONOSCOPY W/ BIOPSIES AND POLYPECTOMY     CORONARY/GRAFT ACUTE MI REVASCULARIZATION N/A 10/11/2020   Procedure: Coronary/Graft Acute MI Revascularization;  Surgeon: Adrian Prows, MD;  Location: [facility] CV LAB;  Service: Cardiovascular;  Laterality: N/A;   I & D EXTREMITY Right 03/11/2022   Procedure: BELOW KNEE AMPUTATION;  Surgeon: Newt Minion, MD;  Location: [facility];  Service: Orthopedics;  Laterality: Right;   LEFT HEART CATH N/A 12/31/2020   Procedure: Left Heart Cath;  Surgeon: Adrian Prows, MD;  Location: [facility] CV LAB;  Service: Cardiovascular;  Laterality: N/A;   LEFT HEART CATH AND CORONARY ANGIOGRAPHY N/A 10/11/2020   Procedure: LEFT HEART CATH AND CORONARY ANGIOGRAPHY;  Surgeon: Adrian Prows, MD;  Location: [facility] [facility] LAB;  Service: Cardiovascular;  Laterality: N/A;   LOWER EXTREMITY ANGIOGRAM Bilateral 04/18/2012   Procedure: LOWER EXTREMITY ANGIOGRAM;  Surgeon: Angelia Mould, MD;  Location[facility] LAB;  Service: Cardiovascular;  Laterality: Bilateral;  bilat lower extrem angio   LOWER EXTREMITY ANGIOGRAPHY Bilateral 05/02/2019   Procedure: LOWER EXTREMITY ANGIOGRAPHY;  Surgeon: Adrian Prows, MD;  Location: [place] [facility];  Service: Cardiovascular;  Laterality: Bilateral;   LOWER EXTREMITY ANGIOGRAPHY Bilateral 02/28/2019   Procedure: LOWER EXTREMITY ANGIOGRAPHY;  Surgeon: Adrian Prows, MD;  Location: [place] [facility] LAB;  Service: Cardiovascular;  Laterality: Bilateral;   macular photocoagulation     (eye treatments for diabetic retinopathy)-Dr. Zigmund Daniel   PERIPHERAL VASCULAR INTERVENTION   02/28/2019   Procedure: PERIPHERAL VASCULAR INTERVENTION;  Surgeon: Adrian Prows, MD;  Location: [place] CV LAB;  Service: Cardiovascular;;   TEE WITHOUT CARDIOVERSION N/A 03/18/2022   Procedure: TRANSESOPHAGEAL ECHOCARDIOGRAM (TEE);  Surgeon: Adrian Prows, MD;  Location: [facility];  Service: Cardiovascular;  Laterality: N/A;   VENTRICULAR ASSIST DEVICE INSERTION N/A 12/31/2020   Procedure: VENTRICULAR ASSIST DEVICE INSERTION;  Surgeon: Adrian Prows, MD;  Location: [place] [facility] LAB;  Service: Cardiovascular;  Laterality: N/A;   Social History   Occupational History   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

## 2022-12-18 ENCOUNTER — Encounter
Payer: Medicare Other | Attending: Physical Medicine and Rehabilitation | Admitting: Physical Medicine and Rehabilitation

## 2022-12-18 ENCOUNTER — Encounter: Payer: Self-pay | Admitting: Physical Medicine and Rehabilitation

## 2022-12-18 VITALS — BP 90/57 | HR 71 | Ht 70.0 in | Wt 240.0 lb

## 2022-12-18 DIAGNOSIS — Z89511 Acquired absence of right leg below knee: Secondary | ICD-10-CM | POA: Insufficient documentation

## 2022-12-18 NOTE — Patient Instructions (Signed)
Pt is a 67 yr old male with recent R BKA in May 2023; , also has hx of dCHF, DM; on AC, PAD; AC held due to pec tear;  Here for f/u on R BKA    Call [facility] and get appointment- to be re-evaluated for possible new socket on R BKA prosthesis- best way to do it and call, be seen and explain going to 16-19 ply every afternoon and starting with 10 ply at beginning of day. Also see if they can help more with protuberant tibia tip as well if making new socket. Has been 6 months-  see them after 12/26/22   Also suggest new liner since skin is blotchy over R knee.   3. Try spraying athlete's foot spray on it just in case.    4.   F/U 3 months, but CALL ME to let me know what I need to prescribe or have [facility] do so.

## 2022-12-18 NOTE — Progress Notes (Signed)
Subjective:    Patient ID: Eduardo [name], male    DOB: 01-Dec-1955, 67 y.o.   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   Called Dr Sharol Given since "splotch on R knee".  Wearing an under-sock under Shuttle lock- Still there - not getting worse but not getting better.     Starts day with 10 ply- then goes to 13 ply after 30 minutes- by end of day, 16-19 ply. Likely needs new socket.  Got current BKA 06/25/22.   Pain Inventory Average Pain 1 Pain Right Now 0 My pain is dull  In the last 24 hours, has pain interfered with the following? General activity 0 Relation with others 0 Enjoyment of life 0 What TIME of day is your pain at its worst? evening Sleep (in general) Fair  Pain is worse with: some activites Pain improves with: rest and medication Relief from Meds:  N/A  Family History  Problem Relation Age of Onset   Diabetes Mother    Hearing loss Mother    Hypertension Mother    Hyperlipidemia Mother    Heart disease Mother    Varicose Veins Mother    Varicose Veins Father    Dementia Father    Hyperlipidemia Brother    Diabetes Maternal Grandmother    Social History   Socioeconomic History   Marital status: Widowed    Spouse name: Not on file   Number of children: 0   Years of education: Not on file   Highest education level: Not on file  Occupational History   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 Retired. No pets. Enjoys fishing.   Updated 06/2021   Social Determinants of Health   Financial Resource Strain: Not on file  Food Insecurity: Not on file  Transportation Needs: Not on file  Physical Activity: Not on file  Stress: Not on file  Social Connections: Not on file   Past Surgical History:  Procedure Laterality Date   ABDOMINAL AORTAGRAM N/A 04/18/2012   Procedure: ABDOMINAL Maxcine Ham;  Surgeon: Angelia Mould, MD;  Location: [facility] CATH LAB;  Service: Cardiovascular;  Laterality: N/A;   AMPUTATION Right 05/19/2019   Procedure: RIGHT FOOT 5TH RAY AMPUTATION;  Surgeon: Newt Minion, MD;  Location: [facility];  Service: Orthopedics;  Laterality: Right;   AMPUTATION Right 03/11/2022  Procedure: RIGHT LEG DEBRIDEMENT VS. BELOW KNEE AMPUTATION;  Surgeon: Newt Minion, MD;  Location: [facility];  Service: Orthopedics;  Laterality: Right;   BIV ICD INSERTION CRT-D N/A 10/21/2022   Procedure: BIV ICD INSERTION CRT-D;  Surgeon: Evans Lance, MD;  Location: [place] CV LAB;  Service: Cardiovascular;  Laterality: N/A;   CARDIAC CATHETERIZATION N/A 09/08/2016   Procedure: Left Heart Cath and Coronary Angiography;  Surgeon: Adrian Prows, MD;  Location: [place] CV LAB;  Service: Cardiovascular;  Laterality: N/A;   CATARACT EXTRACTION, BILATERAL  09/2017, 10/2017   Dr. Herbert Deaner   COLONOSCOPY W/ BIOPSIES AND POLYPECTOMY     CORONARY/GRAFT ACUTE MI REVASCULARIZATION N/A 10/11/2020   Procedure: Coronary/Graft Acute MI Revascularization;  Surgeon: Adrian Prows, MD;  Location: [place] CV LAB;  Service: Cardiovascular;  Laterality: N/A;   I & D EXTREMITY  Right 03/11/2022   Procedure: BELOW KNEE AMPUTATION;  Surgeon: Newt Minion, MD;  Location: [facility];  Service: Orthopedics;  Laterality: Right;   LEFT HEART CATH N/A 12/31/2020   Procedure: Left Heart Cath;  Surgeon: Adrian Prows, MD;  Location: [place] CV LAB;  Service: Cardiovascular;  Laterality: N/A;   LEFT HEART CATH AND CORONARY ANGIOGRAPHY N/A 10/11/2020   Procedure: LEFT HEART CATH AND CORONARY ANGIOGRAPHY;  Surgeon: Adrian Prows, MD;  Location: [place] CV LAB;  Service: Cardiovascular;  Laterality: N/A;   LOWER EXTREMITY ANGIOGRAM Bilateral 04/18/2012   Procedure: LOWER EXTREMITY ANGIOGRAM;  Surgeon: Angelia Mould, MD;  Location: [facility] CATH LAB;  Service: Cardiovascular;  Laterality: Bilateral;  bilat lower extrem angio   LOWER EXTREMITY ANGIOGRAPHY Bilateral 05/02/2019   Procedure: LOWER EXTREMITY ANGIOGRAPHY;  Surgeon: Adrian Prows, MD;  Location: [place] CV LAB;  Service: Cardiovascular;  Laterality: Bilateral;   LOWER EXTREMITY ANGIOGRAPHY Bilateral 02/28/2019   Procedure: LOWER EXTREMITY ANGIOGRAPHY;  Surgeon: Adrian Prows, MD;  Location: [place] CV LAB;  Service: Cardiovascular;  Laterality: Bilateral;   macular photocoagulation     (eye treatments for diabetic retinopathy)-Dr. Zigmund Daniel   PERIPHERAL VASCULAR INTERVENTION  02/28/2019   Procedure: PERIPHERAL VASCULAR INTERVENTION;  Surgeon: Adrian Prows, MD;  Location: [place] CV LAB;  Service: Cardiovascular;;   TEE WITHOUT CARDIOVERSION N/A 03/18/2022   Procedure: TRANSESOPHAGEAL ECHOCARDIOGRAM (TEE);  Surgeon: Adrian Prows, MD;  Location: [facility] ENDOSCOPY;  Service: Cardiovascular;  Laterality: N/A;   VENTRICULAR ASSIST DEVICE INSERTION N/A 12/31/2020   Procedure: VENTRICULAR ASSIST DEVICE INSERTION;  Surgeon: Adrian Prows, MD;  Location: [place] CV LAB;  Service: Cardiovascular;  Laterality: N/A;   Past Surgical History:  Procedure Laterality Date   ABDOMINAL AORTAGRAM N/A 04/18/2012   Procedure: ABDOMINAL AORTAGRAM;   Surgeon: Angelia Mould, MD;  Location: [facility] CATH LAB;  Service: Cardiovascular;  Laterality: N/A;   AMPUTATION Right 05/19/2019   Procedure: RIGHT FOOT 5TH RAY AMPUTATION;  Surgeon: Newt Minion, MD;  Location: [place];  Service: Orthopedics;  Laterality: Right;   AMPUTATION Right 03/11/2022   Procedure: RIGHT LEG DEBRIDEMENT VS. BELOW KNEE AMPUTATION;  Surgeon: Newt Minion, MD;  Location: [place];  Service: Orthopedics;  Laterality: Right;   BIV ICD INSERTION CRT-D N/A 10/21/2022   Procedure: BIV ICD INSERTION CRT-D;  Surgeon: Evans Lance, MD;  Location: [place] CV LAB;  Service: Cardiovascular;  Laterality: N/A;   CARDIAC CATHETERIZATION N/A 09/08/2016   Procedure: Left Heart Cath and Coronary Angiography;  Surgeon: Adrian Prows, MD;  Location: [place] CV LAB;  Service: Cardiovascular;  Laterality: N/A;  CATARACT EXTRACTION, BILATERAL  09/2017, 10/2017   Dr. Herbert Deaner   COLONOSCOPY W/ BIOPSIES AND POLYPECTOMY     CORONARY/GRAFT ACUTE MI REVASCULARIZATION N/A 10/11/2020   Procedure: Coronary/Graft Acute MI Revascularization;  Surgeon: Adrian Prows, MD;  Location: [place] CV LAB;  Service: Cardiovascular;  Laterality: N/A;   I & D EXTREMITY Right 03/11/2022   Procedure: BELOW KNEE AMPUTATION;  Surgeon: Newt Minion, MD;  Location: [place];  Service: Orthopedics;  Laterality: Right;   LEFT HEART CATH N/A 12/31/2020   Procedure: Left Heart Cath;  Surgeon: Adrian Prows, MD;  Location: [place] CV LAB;  Service: Cardiovascular;  Laterality: N/A;   LEFT HEART CATH AND CORONARY ANGIOGRAPHY N/A 10/11/2020   Procedure: LEFT HEART CATH AND CORONARY ANGIOGRAPHY;  Surgeon: Adrian Prows, MD;  Location: [place] CV LAB;  Service: Cardiovascular;  Laterality: N/A;   LOWER EXTREMITY ANGIOGRAM Bilateral 04/18/2012   Procedure: LOWER EXTREMITY ANGIOGRAM;  Surgeon: Angelia Mould, MD;  Location: [facility] CATH LAB;  Service: Cardiovascular;  Laterality: Bilateral;  bilat lower extrem angio   LOWER  EXTREMITY ANGIOGRAPHY Bilateral 05/02/2019   Procedure: LOWER EXTREMITY ANGIOGRAPHY;  Surgeon: Adrian Prows, MD;  Location: [place] CV LAB;  Service: Cardiovascular;  Laterality: Bilateral;   LOWER EXTREMITY ANGIOGRAPHY Bilateral 02/28/2019   Procedure: LOWER EXTREMITY ANGIOGRAPHY;  Surgeon: Adrian Prows, MD;  Location: [place] CV LAB;  Service: Cardiovascular;  Laterality: Bilateral;   macular photocoagulation     (eye treatments for diabetic retinopathy)-Dr. Zigmund Daniel   PERIPHERAL VASCULAR INTERVENTION  02/28/2019   Procedure: PERIPHERAL VASCULAR INTERVENTION;  Surgeon: Adrian Prows, MD;  Location: [place] CV LAB;  Service: Cardiovascular;;   TEE WITHOUT CARDIOVERSION N/A 03/18/2022   Procedure: TRANSESOPHAGEAL ECHOCARDIOGRAM (TEE);  Surgeon: Adrian Prows, MD;  Location: [facility] ENDOSCOPY;  Service: Cardiovascular;  Laterality: N/A;   VENTRICULAR ASSIST DEVICE INSERTION N/A 12/31/2020   Procedure: VENTRICULAR ASSIST DEVICE INSERTION;  Surgeon: Adrian Prows, MD;  Location: [place] CV LAB;  Service: Cardiovascular;  Laterality: N/A;   Past Medical History:  Diagnosis Date   CHF (congestive heart failure) ([place])    Chronic kidney disease    stage 3   Colon polyp    Coronary artery disease    Diabetes mellitus 1987   under care of Dr. Chalmers Cater.  On insulin since 96 (off and on)   Diabetic retinopathy    Dupuytren contracture    R hand, s/p injection (Dr. Lenon Curt)   Essential hypertension, benign    Essential hypertension, benign 02/06/2019   Frequency of urination and polyuria    Hypertension    Myocardial infarction [facility])    denies   Neuromuscular disorder ([place])    Diabetic neuropathy   Osteomyelitis ([place])    right foot   Other testicular hypofunction    Peripheral arterial disease ([place]) 10/28/2012   Peritoneal abscess ([place]) 6/08   and buttock.   Pneumonia    Polydipsia    Proteinuria    Pure hyperglyceridemia    Subacute osteomyelitis, right ankle and foot (HCC)    Wears glasses     There were no vitals taken for this visit.  Opioid Risk Score:   Fall Risk Score:  `1  Depression screen [facility] 2/9     09/18/2022   10:58 AM 07/06/2022    9:42 AM 05/18/2022   11:28 AM 06/26/2021    8:45 AM 06/24/2020    8:40 AM 06/12/2019    8:52 AM 06/06/2018    8:47 AM  Depression screen  PHQ 2/9  Decreased Interest 0 0 0 0 0 0 0  Down, Depressed, Hopeless 0 0 0 0 0 0 0  PHQ - 2 Score 0 0 0 0 0 0 0  Altered sleeping   0      Tired, decreased energy   0      Change in appetite   0      Feeling bad or failure about yourself    0      Trouble concentrating   0      Moving slowly or fidgety/restless   0      Suicidal thoughts   0      PHQ-9 Score   0          Review of Systems  Musculoskeletal:  Positive for gait problem.  All other systems reviewed and are negative.     Objective:   Physical Exam  Awake, alert, appropriate, wearing R BKA, NAD  Blanchable splotch on R knee- IS BLANCHABLE  Using cane to walk with R BKA prosthesis.      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 Also see if they can help more with protuberant tibia tip as well if making new socket. Has been 6 months-  see them after 12/26/22   Also suggest new liner since skin is blotchy over R knee.   3. Try spraying athlete's foot spray on it just in case.    4.   F/U 3 months, but CALL ME to let me know what I need to prescribe or have [facility] do so.    I spent a total of  20  minutes on total care today- >50% coordination of care- due to discussion with pt how to address splotch as well as need for new socket/possible liner.

## 2022-12-19 DIAGNOSIS — E1165 Type 2 diabetes mellitus with hyperglycemia: Secondary | ICD-10-CM | POA: Diagnosis not present

## 2022-12-22 ENCOUNTER — Encounter: Payer: Self-pay | Admitting: *Deleted

## 2022-12-22 DIAGNOSIS — E1165 Type 2 diabetes mellitus with hyperglycemia: Secondary | ICD-10-CM | POA: Diagnosis not present

## 2022-12-22 DIAGNOSIS — I10 Essential (primary) hypertension: Secondary | ICD-10-CM | POA: Diagnosis not present

## 2022-12-22 DIAGNOSIS — Z89421 Acquired absence of other right toe(s): Secondary | ICD-10-CM | POA: Diagnosis not present

## 2022-12-22 DIAGNOSIS — E7800 Pure hypercholesterolemia, unspecified: Secondary | ICD-10-CM | POA: Diagnosis not present

## 2022-12-22 DIAGNOSIS — I739 Peripheral vascular disease, unspecified: Secondary | ICD-10-CM | POA: Diagnosis not present

## 2022-12-22 DIAGNOSIS — R809 Proteinuria, unspecified: Secondary | ICD-10-CM | POA: Diagnosis not present

## 2022-12-22 DIAGNOSIS — I2510 Atherosclerotic heart disease of native coronary artery without angina pectoris: Secondary | ICD-10-CM | POA: Diagnosis not present

## 2022-12-22 DIAGNOSIS — S88911A Complete traumatic amputation of right lower leg, level unspecified, initial encounter: Secondary | ICD-10-CM | POA: Diagnosis not present

## 2022-12-22 DIAGNOSIS — L97509 Non-pressure chronic ulcer of other part of unspecified foot with unspecified severity: Secondary | ICD-10-CM | POA: Diagnosis not present

## 2022-12-22 DIAGNOSIS — N189 Chronic kidney disease, unspecified: Secondary | ICD-10-CM | POA: Diagnosis not present

## 2022-12-22 LAB — HEMOGLOBIN A1C: Hemoglobin A1C: 7.4

## 2022-12-24 ENCOUNTER — Other Ambulatory Visit: Payer: Self-pay | Admitting: *Deleted

## 2022-12-24 ENCOUNTER — Encounter: Payer: Self-pay | Admitting: *Deleted

## 2022-12-24 DIAGNOSIS — E1169 Type 2 diabetes mellitus with other specified complication: Secondary | ICD-10-CM

## 2022-12-24 DIAGNOSIS — I5022 Chronic systolic (congestive) heart failure: Secondary | ICD-10-CM

## 2022-12-28 ENCOUNTER — Telehealth: Payer: Self-pay

## 2022-12-28 NOTE — Progress Notes (Signed)
Patient ID: Eduardo [name], male   DOB: Aug 14, 1956, 67 y.o.   MRN: [id number]  Care Management & Coordination Services Pharmacy Team  Reason for Encounter: Chart Prep for initial encounter with Alda Ponder D in office on 12/31/22 at 10 am.  Have you seen any other providers since your last visit? **no Any changes in your medications or health? no Any side effects from any medications? no Do you have an symptoms or problems not managed by your medications? no  Any concerns about your health right now? No Patient reports he is doing well with medications and his health as far as he is concerned, is concerned about the amputation but doing the best he can with it.  Has your provider asked that you check blood pressure, blood sugar, or follow special diet at home? Patient reports he uses a CGM for monitoring his sugars and he has an arm cuff he uses about once a week for his blood pressures and recently got a watch that monitors as well.  Do you get any type of exercise on a regular basis? Patient reports he is recently retired and is looking forward to getting back to fishing and other than that he is able to get around and do all the housework himself.  Can you think of a goal you would like to reach for your health? Patient reports to stay healthy.  Do you have any problems getting your medications? Patient reports he is happy with his current pharmacy and the cost of his medications. He reports it gets a little more costly when he is in or approaching the donut hole but he is financially managing the cost. He reports his Delene Loll and Cora Daniels are the most expense for him.  Is there anything that you would like to discuss during the appointment? Patient reports none  Patient assistance for the following mediations: None   Patient aware to bring blood pressure cuff, medications that do not need refrigeration and supplements to appointment  Fill History : CARVEDILOL  3.125 MG TABS 12/30/2021  90   CLOPIDOGREL  75 MG TABS 11/28/2022 90   EZETIMIBE  10 MG TABS 10/03/2022 90   GEMFIBROZIL  600 MG TABS 10/03/2022 90   HUMALOG KWIKPEN  100 UNIT/ML SOPN 05/22/2021 167   HUMULIN N KWIKPEN  100 UNIT/ML SUPN 09/04/2022 90   BD PEN NEEDLE MINI U/F 31G X 5 MM MISC 04/19/2022 90   ISOSORBIDE MONONITRATE ER  60 MG TB24 01/05/2022 90   NITROGLYCERIN 0.4MG SUB TAB 25S 05/08/2021 8   ROSUVASTATIN CALCIUM  20 MG TABS 11/21/2022 90   ENTRESTO  24-26 MG TABS 11/24/2022 90   MOUNJARO 10MG/0.5ML PF PEN INJ 12/22/2022 28   TORSEMIDE  20 MG TABS 11/21/2022 90    Chart review:  Recent office visits:  07/06/22 Rita Ohara, MD - Patient presented for Annual Physical Exam and other concerns. Increased Tirzepatide to 7.5 mg. Increased Torsemide to 20 mg. Stopped Oxycodone.  Recent consult visits:  12/18/22 Courtney Heys, MD ( [place]) - Patient presented for S/P BKA, right [facility]). No medication changes new liner and use of athletes foot spray recommended.  12/17/22 Newt Minion, MD  ([place]) - Patient presented for S/P BKA, right [facility]) and other concerns. No medication changes.  11/04/22 Damian Leavell, RN ([facility]) - Clinical support encounter for [facility] and other concerns. No medication changes.  10/16/22 Suzan Slick, NP ( Patient  presented for fall and other concerns. No medication changes.   10/15/22 Newt Minion, MD ([place]) - Patient presented for S/P BKA, right [facility]). No medication changes.  09/25/22 Evans Lance, MD (Cardiology)- Patient presented for [facility] ad other concerns. No medication changes.  09/18/22 Lovorn, Jinny Blossom, MD ( [place]) - Patient presented for S/P BKA, right [facility]) and other concerns. Prescribed Tramadol 50 mg  09/14/22 Isaias Cowman, PT - Patient presented for Other abnormalities of gait and mobility and other concerns. No medication changes.  08/19/22 Custovic, Gabriel Cirri DO  (Cardiology) - Patient presented for [facility] and other concerns. No medication changes.  07/16/22  Newt Minion, MD ([place]) - Patient presented for S/P BKA, right [facility]). No medication changes.  Hospital visits:  Medication Reconciliation was completed by comparing discharge summary, patient's EMR and Pharmacy list, and upon discussion with patient.  Patient presented to [facility] on 10/21/22 due to BIV ICD Insertion CRT- D. Patient was present for 10 hours.  New?Medications Started at [facility] Discharge:?? -started  none  Medication Changes at Hospital Discharge: -Changed  none  Medications Discontinued at Hospital Discharge: -Stopped  none  Medications that remain the same after Hospital Discharge:??  -All other medications will remain the same.     Star Rating Drugs:  Rosuvastatin 20 mg Last filled 11/21/22 90 DS at [place]: AWV- 07/07/22 Lung Cancer Screen - Overdue BP-90/57 12/18/22 Lab Results  Component Value Date   HGBA1C 7.4 12/22/2022       Ned Clines [place] Clinical Pharmacist Assistant [phone]

## 2022-12-29 NOTE — Progress Notes (Addendum)
Care Management & Coordination Services Pharmacy Note  12/31/2022 Name:  Eduardo [name] MRN:  [id number] DOB:  December 03, 1955  Summary: -A1C not at goal (7.4), pt sch to increase mounjaro to '10mg'$  on 3/1, so far tolerating well -Major intxn between crestor/gemfibrozil, recommend holding and recheck lipid panel in 6 weeks, have reached out to endo to discuss as they prescribe. 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 Chalmers Cater Obtain most recent lab work from [facility] - requested with office today Recommend daily BP and weights Obtain access to patient sugars through [place], invite sent to pt in office  Follow up plan: DM review call in 2 months Pharmacist visit in office in July   Subjective: Eduardo [name] is an 67 y.o. year old male who is a primary patient of Rita Ohara, MD.  The care coordination team was consulted for assistance with disease management and care coordination needs. Pt is retired as of 2021, was previously a Designer, jewellery. Lives with girlfriend, likes to go fishing when able but hasn't gone as much since BKA on right. Has plans to go to [facility] for fishing trip with friend soon.   Engaged with patient by telephone for initial visit.  Recent office visits: 07/06/22 Rita Ohara, MD - Patient presented for Annual Physical Exam and other concerns. Increased Tirzepatide to 7.5 mg. Increased Torsemide to 20 mg. Stopped Oxycodone   Recent consult visits: 12/18/22 Courtney Heys, MD ( [place]) - Patient presented for S/P BKA, right ([place]). No medication changes new liner and use of athletes foot spray recommended.   12/17/22 Newt Minion, MD  ([place]) - Patient presented for S/P BKA, right [facility]) and other concerns. No medication changes.   11/04/22 Damian Leavell, RN ([facility]) - Clinical support encounter for [facility] and other concerns. No medication changes.   10/16/22 Suzan Slick, NP ( Patient presented for fall and other concerns. No medication changes.    10/15/22 Newt Minion, MD ([place]) - Patient presented for S/P BKA, right [facility]). No medication changes.   09/25/22 Evans Lance, MD (Cardiology)- Patient presented for [facility] ad other concerns. No medication changes.   09/18/22 Lovorn, Jinny Blossom, MD ( [place]) - Patient presented for S/P BKA, right [facility]) and other concerns. Prescribed Tramadol 50 mg   09/14/22 Isaias Cowman, PT - Patient presented for Other abnormalities of gait and mobility and other concerns. No medication changes.   08/19/22 Custovic, Gabriel Cirri DO (Cardiology) - Patient presented for [facility] and other concerns. No medication changes.   07/16/22  Newt Minion, MD ([place]) - Patient presented for S/P BKA, right [facility]). No medication changes.  Hospital visits: 10/21/22 - [place] - For scheduled BIV ICD insertion, no medication changes   Objective:  Lab Results  Component Value Date   CREATININE 1.5 (A) 12/09/2022   BUN 56 (H) 09/25/2022   EGFR 51 (L) 09/25/2022   GFRNONAA 58 (L) 03/30/2022   GFRAA 44 (L) 12/19/2020   NA 143 09/25/2022   K 4.3 09/25/2022   CALCIUM 9.3 09/25/2022   CO2 22 09/25/2022  GLUCOSE 131 (H) 09/25/2022    Lab Results  Component Value Date/Time   HGBA1C 7.4 12/22/2022 12:00 AM   HGBA1C 6.6 06/23/2022 12:00 AM   MICROALBUR 18 12/05/2021 12:00 AM   MICROALBUR 22.5 06/05/2021 12:00 AM    Last diabetic Eye exam:  Lab Results  Component Value Date/Time   HMDIABEYEEXA Retinopathy (A) 07/07/2022 12:00 AM    Last diabetic Foot exam: No results found for: "HMDIABFOOTEX"   Lab Results  Component Value  Date   CHOL 96 (L) 08/18/2021   HDL 30 (L) 08/18/2021   LDLCALC 42 08/18/2021   TRIG 138 08/18/2021   CHOLHDL 3.9 06/26/2021       Latest Ref Rng & Units 04/08/2022    1:26 PM 03/23/2022    8:13 AM 03/11/2022    2:54 AM  Hepatic Function  Total Protein 6.0 - 8.5 g/dL 6.3  6.0  5.9   Albumin 3.8 - 4.8 g/dL 3.6  2.6  2.1   AST 0 - 40 IU/L '21  27  26   '$ ALT 0 - 44 IU/L 17  35  30   Alk Phosphatase 44 - 121 IU/L 68  60  63   Total Bilirubin 0.0 - 1.2 mg/dL 0.6  0.8  0.6     Lab Results  Component Value Date/Time   TSH 1.650 06/24/2020 09:37 AM   TSH 2.250 06/12/2019 10:00 AM       Latest Ref Rng & Units 09/25/2022   12:24 PM 04/17/2022   11:35 AM 04/08/2022    1:26 PM  CBC  WBC 3.4 - 10.8 x10E3/uL 8.6  6.3  5.7   Hemoglobin 13.0 - 17.7 g/dL 13.4  11.0  11.8   Hematocrit 37.5 - 51.0 % 41.1  33.4  35.6   Platelets 150 - 450 x10E3/uL 185  236  196     Lab Results  Component Value Date/Time   VD25OH 75.05 03/11/2022 02:54 AM   VD25OH 54.2 12/05/2021 12:00 AM    Clinical ASCVD: Yes  The ASCVD Risk score (Arnett DK, et al., 2019) failed to calculate for the following reasons:   The patient has a prior MI or stroke diagnosis        09/18/2022   10:58 AM 07/06/2022    9:42 AM 05/18/2022   11:28 AM  Depression screen PHQ 2/9  Decreased Interest 0 0 0  Down, Depressed, Hopeless 0 0 0  PHQ - 2 Score 0 0 0  Altered sleeping   0  Tired, decreased energy   0  Change in appetite   0  Feeling bad or failure about yourself    0  Trouble concentrating   0  Moving slowly or fidgety/restless   0  Suicidal thoughts   0  PHQ-9 Score   0     Social History   Tobacco Use  Smoking Status Former   Packs/day: 1.00   Years: 30.00   Total pack years: 30.00   Types: Cigarettes   Quit date: 01/08/2012   Years since quitting: 10.9  Smokeless Tobacco Never   BP Readings from Last 3 Encounters:  12/18/22 (!) 90/57  10/21/22 (!) 91/59  09/25/22 (!) 90/58   Pulse Readings from Last  3 Encounters:  12/18/22 71  10/21/22 74  09/25/22 85   Wt Readings from Last 3 Encounters:  12/18/22 240 lb (108.9 kg)  10/21/22 240 lb (108.9 kg)  09/25/22 246 lb (111.6 kg)   BMI Readings from Last 3 Encounters:  12/18/22 34.44 kg/m  10/21/22 34.44 kg/m  09/25/22 35.30 kg/m    Allergies  Allergen Reactions   Shellfish-Derived Products Nausea And Vomiting and Other (See Comments)    Only Mussels cause severe nausea and vomiting    Latex Rash   Tape Rash and Other (See Comments)    Caused issues with the skin   Testosterone Rash    did not feel well     Medications Reviewed Today     Reviewed by Maren Reamer, RPH (Pharmacist) on 12/31/22 at 1200  Med List Status: <None>   Medication Order Taking? Sig Documenting Provider Last Dose Status Informant  acetaminophen (TYLENOL) 325 MG tablet [id number] Yes Take 1-2 tablets (325-650 mg total) by mouth every 4 (four) hours as needed for mild pain. Barbie Banner, PA-C Taking Active Self           Med Note Wilmon Pali, MELISSA R   Fri Oct 16, 2022 12:26 PM)    aspirin 81 MG chewable tablet [id number] Yes Chew 1 tablet (81 mg total) by mouth daily. Adrian Prows, MD Taking Active Self  Bioflavonoid Products (ESTER C PO) [id number] Yes Take 1,000 mg by mouth daily. [provider] Taking Active Self  Cholecalciferol (VITAMIN D) 2000 units CAPS [id number] Yes Take 2,000 Units by mouth daily. [provider] Taking Active Self  clopidogrel (PLAVIX) 75 MG tablet [id number] Yes Take 1 tablet (75 mg total) by mouth daily. Floydene Flock, DO Taking Active Self  ENTRESTO 24-26 MG [id number] Yes TAKE 1 TABLET BY MOUTH TWICE  DAILY Custovic, Collene Mares, DO Taking Active Self  ezetimibe (ZETIA) 10 MG tablet [id number] Yes Take 1 tablet (10 mg total) by mouth daily. Custovic, Sabina, DO Taking Active Self  gemfibrozil (LOPID) 600 MG tablet [id number] Yes Take 600 mg by mouth 2 (two) times daily before a meal. [provider]  Taking Active Self           Med Note Dessie Coma   Sat Dec 28, 2020 12:47 PM)    HUMALOG KWIKPEN 100 UNIT/ML KiwkPen [id number] Yes Inject 3-10 Units into the skin 3 (three) times daily. Inject 01-1009 units into the skin three times a day, per sliding scale- based on BGL >100 [provider] Taking Active Self           Med Note Merceda Elks   Fri Oct 16, 2022 12:26 PM)    Camille Bal KWIKPEN 100 UNIT/ML Mayer Masker [id number] Yes Inject 10 Units into the skin every morning. [provider] Taking Active Self           Med Note Dessie Coma   Sat Dec 28, 2020 12:36 PM)    Astrid Drafts 300 MG CAPS [id number] Yes Take 300 mg by mouth daily. [provider] Taking Active Self  melatonin 10 MG TABS [id number] Yes Take 10 mg by mouth at bedtime. Setzer, Edman Circle, PA-C Taking Active Self  Multiple Vitamin (MULTIVITAMIN WITH MINERALS) TABS tablet [id number] Yes Take 1 tablet by mouth daily. [provider] Taking Active Self           Med Note Jannifer Rodney, APRIL   Wed Nov 08, 2019 11:05 AM)    nitroGLYCERIN (NITROSTAT) 0.4 MG SL tablet [id number]  Place 1 tablet (0.4 mg total) under the tongue every 5 (five) minutes as needed for chest pain. Cantwell, Celeste C, PA-C  Active Self  Probiotic Product (PROBIOTIC-10 PO) [id number] Yes Take 1 capsule by mouth daily. [provider]  Taking Active Self  rosuvastatin (CRESTOR) 20 MG tablet [id number] Yes Take 1 tablet (20 mg total) by mouth daily. Adrian Prows, MD Taking Active Self           Med Note Dessie Coma   Sat Dec 28, 2020 12:36 PM)    sodium chloride (OCEAN) 0.65 % SOLN nasal spray [id number] Yes Place 1 spray into both nostrils as needed for congestion. [provider] Taking Active Self  tirzepatide Darcel Bayley) 7.5 MG/0.5ML Pen [id number]  Inject 7.5 mg into the skin every Friday. [provider]  Active Self           Med Note Maren Reamer   Thu Dec 31, 2022 12:00 PM) '10mg'$  starting 3/1   torsemide (DEMADEX) 20 MG tablet [id number] Yes TAKE 1 TABLET BY MOUTH TWICE  DAILY Custovic, Collene Mares, DO Taking Active             SDOH:  (Social Determinants of Health) assessments and interventions performed: Yes SDOH Interventions    Flowsheet Row Care Coordination from 12/31/2022 in [place] Interventions   Food Insecurity Interventions Intervention Not Indicated  Housing Interventions Intervention Not Indicated  Transportation Interventions Intervention Not Indicated  Utilities Interventions Intervention Not Indicated  Financial Strain Interventions Intervention Not Indicated      SDOH Interventions    [place] Coordination from 12/31/2022 in [place] Interventions   Food Insecurity Interventions Intervention Not Indicated  Housing Interventions Intervention Not Indicated  Transportation Interventions Intervention Not Indicated  Utilities Interventions Intervention Not Indicated  Financial Strain Interventions Intervention Not Indicated       Medication Assistance: None required.  Patient affirms current coverage meets needs.  Medication Access: Within the past 30 days, how often has patient missed a dose of medication? None Is a pillbox or other method used to improve adherence? Yes  Factors that may affect medication adherence? no barriers identified Are meds synced by current pharmacy? No  Are meds delivered by current pharmacy? Yes  Does patient experience delays in picking up medications due to transportation concerns? No   [facility] Services Reviewed: Is patient disadvantaged to use [facility]?: Yes  Current Rx insurance plan: [facility] Name and location of Current pharmacy:  [facility] [address], [place] [address] [place] [address] Phone: [phone] Fax: [phone]  [facility] ([place], [facility] [address] [place] [address] [place] [address] Phone: [phone] Fax: [phone]  [place], [place] [address] Phone: [phone] Fax: [phone]  [facility] services reviewed with patient today?: No  Patient requests to transfer care to [facility]?: No  Reason patient declined to change pharmacies: Disadvantaged due to insurance/mail order  Compliance/Adherence/Medication fill history: Care Gaps: Lung Cancer Screening  Star-Rating Drugs: Rosuvastatin '20mg'$  PDC 100% Entresto 24-'26mg'$  PDC 100% Mounjaro 7.'5mg'$  PDC 97%   Assessment/Plan   Hyperlipidemia: (LDL goal < 70) -Not ideally controlled -Current treatment: Rosuvastatin '20mg'$  qd Appropriate, Effective, Safe, Accessible  Ezetimibe '10mg'$  qd Appropriate, Effective, Safe, Accessible Gemfibrozil '600mg'$  BID Appropriate, Effective, Query Safety -Intxn between rosuvastatin/gemfibrozil, significant increase risk of rhabdomyolysis, especially as rosuvastatin dose is exceeding '10mg'$ . Patient has been safely taking for years, denies muscle aches. Would like to consider holding gemfibrozil and rechecking labs  in 6 weeks. Have reached out to Dr. Chalmers Cater at [facility] to request most recent labwork from 2/13 appt and request a hold of gemfibrozil, as she prescribes. Awaiting response. -Medications previously tried: None  -Current dietary patterns: see diabetes section -Current exercise habits: limited, recently had a BKA, walking with cane at this time and prosthetic -Educated on Cholesterol goals;  Benefits of statin for ASCVD risk reduction; Importance of limiting foods high in cholesterol; - Recommend holding gemfibrozil and obtain updated labs in 6 weeks, awaiting response from endo who prescribes -Continue other medication therapy  Diabetes (A1c goal <7%) -Uncontrolled -Current  medications: Humalog ss based on sugars Appropriate, Effective, Safe, Accessible Humulin 5 units in the morning Appropriate, Effective, Safe, Accessible Mounjaro 7.'5mg'$  once weekly Appropriate, Effective, Safe, Accessible Gives on Fridays, sch to increase to '10mg'$  dose on 3/1 -Medications previously tried: Jardiance, Metformin -Current home glucose readings fasting glucose: 2-3 sugar lows in last week (around 59), did not feel them, ate glucose tab, rechecked in 15 min for improvement post prandial glucose: not discussed, is monitoring with CGM and uses SS humalog to correct if needed -Denies hypoglycemic/hyperglycemic symptoms -Current meal patterns:  breakfast: premier protein shake  lunch: sandwich and soup  dinner: not discussed snacks: none drinks: water flavored with sugar free packets -Current exercise: walking with assistance of cane -Educated on A1c and blood sugar goals; Prevention and management of hypoglycemic episodes; Benefits of routine self-monitoring of blood sugar; -Counseled to check feet daily and get yearly eye exams -Recommended to continue current medication -Sent request to pt Elenor Legato view account for access to CGM readings  Heart Failure (Goal: manage symptoms and prevent exacerbations) -Controlled -Last ejection fraction: 20-25% (Date: 03/18/22) -HF type: HFrEF (EF < 40%) -NYHA Class: III (marked limitation of activity) -AHA HF Stage: C (Heart disease and symptoms present) -Current treatment: Torsemide '20mg'$  BID Appropriate, Effective, Safe, Accessible  Entresto 24-'26mg'$  BID Appropriate, Effective, Safe, Accessible -Medications previously tried: Carvedilol, Lasix, Lisinopril, Losartan, Metoprolol, Spironolactone -Current home BP/HR readings: 114/71 with wrist cuff in office today, did not capture HR -Current home daily weights: did not discuss -Current dietary habits: see diabetes section -Current exercise habits: see diabetes section -Pt SBP will sometimes  dip into 90s, denies any symptoms. For that reason, unable to maximize and implement GDMT -Pt admits to occasionally taking torsemide '30mg'$  BID for 1-3 days for left ankle swelling and switching back to prescribed dose once resolved. Has not done so since Dec.   -Educated on Importance of blood pressure control -Reviewed proper BP monitoring technique and to notify office of any swelling -Recommended to continue current medication  CAD (Goal: Prevent cardiovascular events and bleeding events) -Condition not assessed today -Current treatment  Aspirin '81mg'$  1 qd Appropriate, Effective, Safe, Accessible Clopidogrel '75mg'$  1 qd Appropriate, Effective, Safe, Accessible Nitroglycerin 0.'4mg'$  prn Appropriate, Effective, Safe, Accessible  OTC Medications (Goal: Monitor for drug intxns and appropriate dosing) -Current treatment  Acetaminophen '650mg'$  1 qd prn Appropriate, Effective, Safe, Accessible Ester C 1 qd Appropriate, Effective, Safe, Accessible Vitamin D 2000 units 1 qd Appropriate, Effective, Safe, Accessible Krill Oil '300mg'$  1 qd Appropriate, Effective, Safe, Accessible MVI 1 qd Appropriate, Effective, Safe, Accessible Melatonin '10mg'$  1qd Appropriate, Effective, Safe, Accessible [place] Nasal Spray 1 spray each nostril daily Appropriate, Effective, Safe, Accessible    Maren Reamer Clinical Pharmacist [phone]

## 2022-12-31 ENCOUNTER — Ambulatory Visit: Payer: Medicare Other

## 2022-12-31 DIAGNOSIS — T8743 Infection of amputation stump, right lower extremity: Secondary | ICD-10-CM | POA: Diagnosis not present

## 2022-12-31 DIAGNOSIS — Z89511 Acquired absence of right leg below knee: Secondary | ICD-10-CM | POA: Diagnosis not present

## 2022-12-31 DIAGNOSIS — I255 Ischemic cardiomyopathy: Secondary | ICD-10-CM | POA: Diagnosis not present

## 2023-01-17 DIAGNOSIS — E1165 Type 2 diabetes mellitus with hyperglycemia: Secondary | ICD-10-CM | POA: Diagnosis not present

## 2023-01-21 ENCOUNTER — Ambulatory Visit: Payer: Medicare Other

## 2023-01-21 DIAGNOSIS — I255 Ischemic cardiomyopathy: Secondary | ICD-10-CM

## 2023-01-22 LAB — CUP PACEART REMOTE DEVICE CHECK
Battery Remaining Longevity: 53 mo
Battery Remaining Percentage: 91 %
Battery Voltage: 2.98 V
Brady Statistic AP VP Percent: 4.5 %
Brady Statistic AP VS Percent: 1 %
Brady Statistic AS VP Percent: 91 %
Brady Statistic AS VS Percent: 2.5 %
Brady Statistic RA Percent Paced: 2.1 %
Date Time Interrogation Session: 20240314030540
HighPow Impedance: 60 Ohm
Implantable Lead Connection Status: 753985
Implantable Lead Connection Status: 753985
Implantable Lead Connection Status: 753985
Implantable Lead Implant Date: 20231213
Implantable Lead Implant Date: 20231213
Implantable Lead Implant Date: 20231213
Implantable Lead Location: 753858
Implantable Lead Location: 753859
Implantable Lead Location: 753860
Implantable Pulse Generator Implant Date: 20231213
Lead Channel Impedance Value: 360 Ohm
Lead Channel Impedance Value: 430 Ohm
Lead Channel Impedance Value: 630 Ohm
Lead Channel Pacing Threshold Amplitude: 0.5 V
Lead Channel Pacing Threshold Amplitude: 0.75 V
Lead Channel Pacing Threshold Amplitude: 1 V
Lead Channel Pacing Threshold Pulse Width: 0.5 ms
Lead Channel Pacing Threshold Pulse Width: 0.5 ms
Lead Channel Pacing Threshold Pulse Width: 0.5 ms
Lead Channel Sensing Intrinsic Amplitude: 11.9 mV
Lead Channel Sensing Intrinsic Amplitude: 2.6 mV
Lead Channel Setting Pacing Amplitude: 3.5 V
Lead Channel Setting Pacing Amplitude: 3.5 V
Lead Channel Setting Pacing Amplitude: 3.5 V
Lead Channel Setting Pacing Pulse Width: 0.5 ms
Lead Channel Setting Pacing Pulse Width: 0.5 ms
Lead Channel Setting Sensing Sensitivity: 0.5 mV
Pulse Gen Serial Number: 211000872

## 2023-01-25 ENCOUNTER — Other Ambulatory Visit: Payer: Self-pay | Admitting: Internal Medicine

## 2023-01-29 ENCOUNTER — Other Ambulatory Visit: Payer: Self-pay | Admitting: Internal Medicine

## 2023-01-29 DIAGNOSIS — T8743 Infection of amputation stump, right lower extremity: Secondary | ICD-10-CM | POA: Diagnosis not present

## 2023-01-29 DIAGNOSIS — Z89511 Acquired absence of right leg below knee: Secondary | ICD-10-CM | POA: Diagnosis not present

## 2023-01-29 DIAGNOSIS — I255 Ischemic cardiomyopathy: Secondary | ICD-10-CM | POA: Diagnosis not present

## 2023-02-04 ENCOUNTER — Ambulatory Visit: Payer: Medicare Other | Attending: Internal Medicine | Admitting: Internal Medicine

## 2023-02-04 ENCOUNTER — Encounter: Payer: Self-pay | Admitting: Internal Medicine

## 2023-02-04 VITALS — BP 108/64 | HR 70 | Ht 70.0 in | Wt 249.2 lb

## 2023-02-04 DIAGNOSIS — I5022 Chronic systolic (congestive) heart failure: Secondary | ICD-10-CM

## 2023-02-04 DIAGNOSIS — Z95810 Presence of automatic (implantable) cardiac defibrillator: Secondary | ICD-10-CM | POA: Insufficient documentation

## 2023-02-04 DIAGNOSIS — I255 Ischemic cardiomyopathy: Secondary | ICD-10-CM

## 2023-02-04 DIAGNOSIS — I447 Left bundle-branch block, unspecified: Secondary | ICD-10-CM | POA: Diagnosis not present

## 2023-02-04 LAB — CUP PACEART INCLINIC DEVICE CHECK
Battery Remaining Longevity: 85 mo
Brady Statistic RA Percent Paced: 2.1 %
Brady Statistic RV Percent Paced: 96 %
Date Time Interrogation Session: 20240328202837
HighPow Impedance: 63 Ohm
Implantable Lead Connection Status: 753985
Implantable Lead Connection Status: 753985
Implantable Lead Connection Status: 753985
Implantable Lead Implant Date: 20231213
Implantable Lead Implant Date: 20231213
Implantable Lead Implant Date: 20231213
Implantable Lead Location: 753858
Implantable Lead Location: 753859
Implantable Lead Location: 753860
Implantable Pulse Generator Implant Date: 20231213
Lead Channel Impedance Value: 375 Ohm
Lead Channel Impedance Value: 387.5 Ohm
Lead Channel Impedance Value: 687.5 Ohm
Lead Channel Pacing Threshold Amplitude: 0.75 V
Lead Channel Pacing Threshold Amplitude: 0.75 V
Lead Channel Pacing Threshold Amplitude: 0.75 V
Lead Channel Pacing Threshold Amplitude: 0.75 V
Lead Channel Pacing Threshold Amplitude: 1 V
Lead Channel Pacing Threshold Amplitude: 1 V
Lead Channel Pacing Threshold Pulse Width: 0.5 ms
Lead Channel Pacing Threshold Pulse Width: 0.5 ms
Lead Channel Pacing Threshold Pulse Width: 0.5 ms
Lead Channel Pacing Threshold Pulse Width: 0.5 ms
Lead Channel Pacing Threshold Pulse Width: 0.5 ms
Lead Channel Pacing Threshold Pulse Width: 0.5 ms
Lead Channel Sensing Intrinsic Amplitude: 11.2 mV
Lead Channel Sensing Intrinsic Amplitude: 2.6 mV
Lead Channel Setting Pacing Amplitude: 2 V
Lead Channel Setting Pacing Amplitude: 2 V
Lead Channel Setting Pacing Amplitude: 2 V
Lead Channel Setting Pacing Pulse Width: 0.5 ms
Lead Channel Setting Pacing Pulse Width: 0.5 ms
Lead Channel Setting Sensing Sensitivity: 0.5 mV
Pulse Gen Serial Number: 211000872

## 2023-02-04 NOTE — Progress Notes (Signed)
HPI Eduardo [name] returns for ongoing management of his CHF, s/p Biv ICD insertion. He is a pleasant 67 yo man with obesity, longstanding DM, chronic systolic heart failure and LBBB. He has an EF of 25% by echo with severe 3 vessel CAD, not amenable to revascularization. He has chronic stage 2 renal insufficiency. He has not had syncope. He has class 2B CHF. His QRS was 150 ms and he underwent biv ICD insertion 3 months ago. In the interim, he has been stable.  Allergies  Allergen Reactions   Shellfish-Derived Products Nausea And Vomiting and Other (See Comments)    Only Mussels cause severe nausea and vomiting    Latex Rash   Tape Rash and Other (See Comments)    Caused issues with the skin   Testosterone Rash    did not feel well      Current Outpatient Medications  Medication Sig Dispense Refill   acetaminophen (TYLENOL) 325 MG tablet Take 1-2 tablets (325-650 mg total) by mouth every 4 (four) hours as needed for mild pain.     aspirin 81 MG chewable tablet Chew 1 tablet (81 mg total) by mouth daily.     Bioflavonoid Products (ESTER C PO) Take 1,000 mg by mouth daily.     Cholecalciferol (VITAMIN D) 2000 units CAPS Take 2,000 Units by mouth daily.     clopidogrel (PLAVIX) 75 MG tablet TAKE 1 TABLET BY MOUTH DAILY 90 tablet 1   ENTRESTO 24-26 MG TAKE 1 TABLET BY MOUTH TWICE  DAILY 180 tablet 1   ezetimibe (ZETIA) 10 MG tablet Take 1 tablet (10 mg total) by mouth daily. 90 tablet 3   gemfibrozil (LOPID) 600 MG tablet Take 600 mg by mouth 2 (two) times daily before a meal.     HUMALOG KWIKPEN 100 UNIT/ML KiwkPen Inject 3-10 Units into the skin 3 (three) times daily. Inject 01-1009 units into the skin three times a day, per sliding scale- based on BGL >100  1   HUMULIN N KWIKPEN 100 UNIT/ML Kiwkpen Inject 5 Units into the skin every morning.  1   Krill Oil 300 MG CAPS Take 300 mg by mouth daily.     melatonin 10 MG TABS Take 10 mg by mouth at bedtime.  0   MOUNJARO 10 MG/0.5ML Pen  Inject 10 mg into the skin once a week.     Multiple Vitamin (MULTIVITAMIN WITH MINERALS) TABS tablet Take 1 tablet by mouth daily.     nitroGLYCERIN (NITROSTAT) 0.4 MG SL tablet Place 1 tablet (0.4 mg total) under the tongue every 5 (five) minutes as needed for chest pain. 15 tablet 3   Probiotic Product (PROBIOTIC-10 PO) Take 1 capsule by mouth daily.     rosuvastatin (CRESTOR) 20 MG tablet Take 1 tablet (20 mg total) by mouth daily. 90 tablet 3   sodium chloride (OCEAN) 0.65 % SOLN nasal spray Place 1 spray into both nostrils as needed for congestion.     torsemide (DEMADEX) 20 MG tablet TAKE 1 TABLET BY MOUTH TWICE  DAILY 180 tablet 3   No current facility-administered medications for this visit.     Past Medical History:  Diagnosis Date   CHF (congestive heart failure) (HCC)    Chronic kidney disease    stage 3   Colon polyp    Coronary artery disease    Diabetes mellitus 1987   under care of Dr. Chalmers Cater.  On insulin since 96 (off and on)   Diabetic  retinopathy    Dupuytren contracture    R hand, s/p injection (Dr. Lenon Curt)   Essential hypertension, benign    Essential hypertension, benign 02/06/2019   Frequency of urination and polyuria    Hypertension    Myocardial infarction [facility])    denies   Neuromuscular disorder ([place])    Diabetic neuropathy   Osteomyelitis ([place])    right foot   Other testicular hypofunction    Peripheral arterial disease ([place]) 10/28/2012   Peritoneal abscess ([place]) 6/08   and buttock.   Pneumonia    Polydipsia    Proteinuria    Pure hyperglyceridemia    Subacute osteomyelitis, right ankle and foot (HCC)    Wears glasses     ROS:   All systems reviewed and negative except as noted in the HPI.   Past Surgical History:  Procedure Laterality Date   ABDOMINAL AORTAGRAM N/A 04/18/2012   Procedure: ABDOMINAL Maxcine Ham;  Surgeon: Angelia Mould, MD;  Location: [facility] CATH LAB;  Service: Cardiovascular;  Laterality: N/A;   AMPUTATION Right  05/19/2019   Procedure: RIGHT FOOT 5TH RAY AMPUTATION;  Surgeon: Newt Minion, MD;  Location: [place];  Service: Orthopedics;  Laterality: Right;   AMPUTATION Right 03/11/2022   Procedure: RIGHT LEG DEBRIDEMENT VS. BELOW KNEE AMPUTATION;  Surgeon: Newt Minion, MD;  Location: [place];  Service: Orthopedics;  Laterality: Right;   BIV ICD INSERTION CRT-D N/A 10/21/2022   Procedure: BIV ICD INSERTION CRT-D;  Surgeon: Evans Lance, MD;  Location: [place] CV LAB;  Service: Cardiovascular;  Laterality: N/A;   CARDIAC CATHETERIZATION N/A 09/08/2016   Procedure: Left Heart Cath and Coronary Angiography;  Surgeon: Adrian Prows, MD;  Location: [place] CV LAB;  Service: Cardiovascular;  Laterality: N/A;   CATARACT EXTRACTION, BILATERAL  09/2017, 10/2017   Dr. Herbert Deaner   COLONOSCOPY W/ BIOPSIES AND POLYPECTOMY     CORONARY/GRAFT ACUTE MI REVASCULARIZATION N/A 10/11/2020   Procedure: Coronary/Graft Acute MI Revascularization;  Surgeon: Adrian Prows, MD;  Location: [place] CV LAB;  Service: Cardiovascular;  Laterality: N/A;   I & D EXTREMITY Right 03/11/2022   Procedure: BELOW KNEE AMPUTATION;  Surgeon: Newt Minion, MD;  Location: [place];  Service: Orthopedics;  Laterality: Right;   LEFT HEART CATH N/A 12/31/2020   Procedure: Left Heart Cath;  Surgeon: Adrian Prows, MD;  Location: [place] CV LAB;  Service: Cardiovascular;  Laterality: N/A;   LEFT HEART CATH AND CORONARY ANGIOGRAPHY N/A 10/11/2020   Procedure: LEFT HEART CATH AND CORONARY ANGIOGRAPHY;  Surgeon: Adrian Prows, MD;  Location: [place] CV LAB;  Service: Cardiovascular;  Laterality: N/A;   LOWER EXTREMITY ANGIOGRAM Bilateral 04/18/2012   Procedure: LOWER EXTREMITY ANGIOGRAM;  Surgeon: Angelia Mould, MD;  Location: [facility] CATH LAB;  Service: Cardiovascular;  Laterality: Bilateral;  bilat lower extrem angio   LOWER EXTREMITY ANGIOGRAPHY Bilateral 05/02/2019   Procedure: LOWER EXTREMITY ANGIOGRAPHY;  Surgeon: Adrian Prows, MD;  Location: [place] CV LAB;  Service: Cardiovascular;  Laterality: Bilateral;   LOWER EXTREMITY ANGIOGRAPHY Bilateral 02/28/2019   Procedure: LOWER EXTREMITY ANGIOGRAPHY;  Surgeon: Adrian Prows, MD;  Location: [place] CV LAB;  Service: Cardiovascular;  Laterality: Bilateral;   macular photocoagulation     (eye treatments for diabetic retinopathy)-Dr. Zigmund Daniel   PERIPHERAL VASCULAR INTERVENTION  02/28/2019   Procedure: PERIPHERAL VASCULAR INTERVENTION;  Surgeon: Adrian Prows, MD;  Location: [place] CV LAB;  Service: Cardiovascular;;   TEE WITHOUT CARDIOVERSION N/A 03/18/2022   Procedure: TRANSESOPHAGEAL ECHOCARDIOGRAM (  TEE);  Surgeon: Adrian Prows, MD;  Location: [facility] ENDOSCOPY;  Service: Cardiovascular;  Laterality: N/A;   VENTRICULAR ASSIST DEVICE INSERTION N/A 12/31/2020   Procedure: VENTRICULAR ASSIST DEVICE INSERTION;  Surgeon: Adrian Prows, MD;  Location: [place] CV LAB;  Service: Cardiovascular;  Laterality: N/A;     Family History  Problem Relation Age of Onset   Diabetes Mother    Hearing loss Mother    Hypertension Mother    Hyperlipidemia Mother    Heart disease Mother    Varicose Veins Mother    Varicose Veins Father    Dementia Father    Hyperlipidemia Brother    Diabetes Maternal Grandmother      Social History   Socioeconomic History   Marital status: Widowed    Spouse name: Not on file   Number of children: 0   Years of education: Not on file   Highest education level: Not on file  Occupational History   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 Retired. No pets. Enjoys fishing.   Updated 06/2021   Social Determinants of Health   Financial Resource Strain: Low Risk  (12/31/2022)   Overall Financial Resource Strain (CARDIA)    Difficulty of Paying Living Expenses: Not hard at all  Food Insecurity: No Food Insecurity (12/31/2022)   Hunger Vital Sign    Worried About Running Out of Food in the Last Year: Never true    Ran Out of Food in the Last Year: Never true  Transportation Needs: No Transportation Needs (12/31/2022)   PRAPARE - Hydrologist (Medical): No    Lack of Transportation (Non-Medical): No  Physical Activity: Not on file  Stress: Not on file  Social Connections: Not on file  Intimate Partner Violence: Not on file     BP 108/64   Pulse 70   Ht 5\' 10"  (1.778 m)   Wt 249 lb 3.2 oz (113 kg)   SpO2 96%   BMI 35.76 kg/m   Physical Exam:  Well appearing NAD HEENT: Unremarkable Neck:  No JVD, no thyromegally Lymphatics:  No adenopathy Back:  No CVA tenderness Lungs:  Clear HEART:  Regular rate rhythm, no murmurs, no rubs, no clicks Abd:  soft, positive bowel sounds, no organomegally, no rebound, no guarding Ext:  2 plus pulses, no edema, no cyanosis, no clubbing Skin:  No rashes no nodules Neuro:  CN II through XII intact, motor grossly intact  EKG - nsr with biv pacing  DEVICE  Normal device function.  See PaceArt for details.   Assess/Plan:  Chronic systolic heart failure - he has been on GDMT. He is s/p Biv ICD insertion and his symptoms are class 2A. He will continue his current meds. CAD - he  does not have angina. He is encouraged to stay active. PVD - he is s/p PVI and right BKA.  Obesity - his weight up 3 lbs since his last visit.  Eduardo [name] Eduardo Sassano,MD

## 2023-02-04 NOTE — Patient Instructions (Signed)
Medication Instructions:  Your physician recommends that you continue on your current medications as directed. Please refer to the Current Medication list given to you today.  *If you need a refill on your cardiac medications before your next appointment, please call your pharmacy*  Lab Work: None ordered.  If you have labs (blood work) drawn today and your tests are completely normal, you will receive your results only by: [place] (if you have MyChart) OR A paper copy in the mail If you have any lab test that is abnormal or we need to change your treatment, we will call you to review the results.  Testing/Procedures: None ordered.  Follow-Up: At [facility], you and your health needs are our priority.  As part of our continuing mission to provide you with exceptional heart care, we have created designated Provider Care Teams.  These Care Teams include your primary Cardiologist (physician) and Advanced Practice Providers (APPs -  Physician Assistants and Nurse Practitioners) who all work together to provide you with the care you need, when you need it.  We recommend signing up for the patient portal called "MyChart".  Sign up information is provided on this After Visit Summary.  MyChart is used to connect with patients for Virtual Visits (Telemedicine).  Patients are able to view lab/test results, encounter notes, upcoming appointments, etc.  Non-urgent messages can be sent to your provider as well.   To learn more about what you can do with MyChart, go to [email or web address].    Your next appointment:   1 year(s)  The format for your next appointment:   In Person  Provider:   Cristopher Peru, MD{or one of the following Advanced Practice Providers on your designated Care Team:   Tommye Standard, [place] Legrand Como "Jonni Sanger" Chalmers Cater, [place]  Remote monitoring is used to monitor your ICD from home. This monitoring reduces the number of office visits required to check your device to one  time per year. It allows [place] to keep an eye on the functioning of your device to ensure it is working properly. You are scheduled for a device check from home on 04/22/23. You may send your transmission at any time that day. If you have a wireless device, the transmission will be sent automatically. After your physician reviews your transmission, you will receive a postcard with your next transmission date.

## 2023-02-17 DIAGNOSIS — E1165 Type 2 diabetes mellitus with hyperglycemia: Secondary | ICD-10-CM | POA: Diagnosis not present

## 2023-02-18 ENCOUNTER — Ambulatory Visit: Payer: Medicare Other | Admitting: Internal Medicine

## 2023-02-18 ENCOUNTER — Encounter: Payer: Self-pay | Admitting: Internal Medicine

## 2023-02-18 VITALS — BP 105/60 | HR 73 | Ht 70.0 in | Wt 244.6 lb

## 2023-02-18 DIAGNOSIS — I447 Left bundle-branch block, unspecified: Secondary | ICD-10-CM

## 2023-02-18 DIAGNOSIS — Z95810 Presence of automatic (implantable) cardiac defibrillator: Secondary | ICD-10-CM

## 2023-02-18 DIAGNOSIS — I255 Ischemic cardiomyopathy: Secondary | ICD-10-CM | POA: Diagnosis not present

## 2023-02-18 DIAGNOSIS — I5022 Chronic systolic (congestive) heart failure: Secondary | ICD-10-CM | POA: Diagnosis not present

## 2023-02-18 NOTE — Progress Notes (Signed)
Primary Physician/Referring:  Joselyn Arrow, MD  Patient ID: Eduardo [name], male    DOB: [birth year], 67 y.o.   MRN: [id number]  Chief Complaint  Patient presents with   Chronic systolic heart failure   Follow-up    HPI:    Eduardo [name]  is a 67 y.o. Caucasian male  with controlled type 2 diabetes, hypertension, hyperlipidemia, coronary artery disease by angiography on 09/08/2016 revealing severe triple-vessel CAD with no targets for CABG, he presented with new onset left bundle branch block and acute fulminant pulmonary edema needing intubation on 10/11/2020, emergent cardiac catheterization essentially revealing progression of severe native vessel disease with no significant option for revascularization.  Although LAD was felt to be amenable for potential revascularization, it was unchanged from prior cardiac catheterization and RCA was felt to be the culprit, in view of diffuse disease medical management was recommended. Past medical history is also significant for morbid obesity, diabetes mellitus with stage III AV chronic kidney disease, mild carotid atherosclerosis, right SFA occlusion S/P complex PV angiogram on 02/28/2019 with PTA to occluded right SFA with implantation of 3 overlapping 6.0 x 120 x2; 6.0 x 100 mm Eluvia DES for critical right leg ischemia now resolved. Right small toe amputation by Dr. Lajoyce Corners for diabetic foot ulcer. He has mild disease in the left leg.  PAD has remained stable. Due to frequent PVCs, he was also started on amiodarone  prophylactically.  Patient was hospitalized 12/28/2020-01/02/2021 for acute on chronic systolic heart failure.  Patient was diuresed well, and given ischemic cardiomyopathy as etiology of heart failure attempted high risk PCI to LAD CTO, however it was unsuccessful. Conservative medical management was recommended, with consideration of palliative care involvement.  Patient was previously on spironolactone, however due to renal insufficiency this has been  discontinued. Patient was hospitalized 03/08/2022 - 03/21/2022 with septic shock secondary to strep bacteremia from diabetic foot infection.  During hospitalization patient underwent right BKA by Dr. Lajoyce Corners on 03/12/2022.    Patient is here for a follow-up visit. He has been doing well since the last time he was here. No complaints or concerns today. He had ICD placed with Dr Ladona Ridgel, no complications. He is feeling pretty good. He is tolerating his medications without issues. Denies CP, SOB, palpitations, diaphoresis, syncope, edema, orthopnea.   Past Medical History:  Diagnosis Date   CHF (congestive heart failure)    Chronic kidney disease    stage 3   Colon polyp    Coronary artery disease    Diabetes mellitus 1987   under care of Dr. Talmage Nap.  On insulin since 96 (off and on)   Diabetic retinopathy    Dupuytren contracture    R hand, s/p injection (Dr. Izora Ribas)   Essential hypertension, benign    Essential hypertension, benign 02/06/2019   Frequency of urination and polyuria    Hypertension    Myocardial infarction    denies   Neuromuscular disorder    Diabetic neuropathy   Osteomyelitis    right foot   Other testicular hypofunction    Peripheral arterial disease 10/28/2012   Peritoneal abscess 6/08   and buttock.   Pneumonia    Polydipsia    Proteinuria    Pure hyperglyceridemia    Subacute osteomyelitis, right ankle and foot    Wears glasses    Past Surgical History:  Procedure Laterality Date   ABDOMINAL AORTAGRAM N/A 04/18/2012   Procedure: ABDOMINAL Ronny Flurry;  Surgeon: Chuck Hint, MD;  Location: [facility] CATH  LAB;  Service: Cardiovascular;  Laterality: N/A;   AMPUTATION Right 05/19/2019   Procedure: RIGHT FOOT 5TH RAY AMPUTATION;  Surgeon: Nadara Mustard, MD;  Location: [facility] OR;  Service: Orthopedics;  Laterality: Right;   AMPUTATION Right 03/11/2022   Procedure: RIGHT LEG DEBRIDEMENT VS. BELOW KNEE AMPUTATION;  Surgeon: Nadara Mustard, MD;  Location: [facility] OR;  Service:  Orthopedics;  Laterality: Right;   BIV ICD INSERTION CRT-D N/A 10/21/2022   Procedure: BIV ICD INSERTION CRT-D;  Surgeon: Marinus Maw, MD;  Location: [facility] INVASIVE CV LAB;  Service: Cardiovascular;  Laterality: N/A;   CARDIAC CATHETERIZATION N/A 09/08/2016   Procedure: Left Heart Cath and Coronary Angiography;  Surgeon: Yates Decamp, MD;  Location: [facility] INVASIVE CV LAB;  Service: Cardiovascular;  Laterality: N/A;   CATARACT EXTRACTION, BILATERAL  09/2017, 10/2017   Dr. Elmer Picker   COLONOSCOPY W/ BIOPSIES AND POLYPECTOMY     CORONARY/GRAFT ACUTE MI REVASCULARIZATION N/A 10/11/2020   Procedure: Coronary/Graft Acute MI Revascularization;  Surgeon: Yates Decamp, MD;  Location: [facility] INVASIVE CV LAB;  Service: Cardiovascular;  Laterality: N/A;   I & D EXTREMITY Right 03/11/2022   Procedure: BELOW KNEE AMPUTATION;  Surgeon: Nadara Mustard, MD;  Location: [facility] OR;  Service: Orthopedics;  Laterality: Right;   INSERT / REPLACE / REMOVE PACEMAKER     LEFT HEART CATH N/A 12/31/2020   Procedure: Left Heart Cath;  Surgeon: Yates Decamp, MD;  Location: [facility] INVASIVE CV LAB;  Service: Cardiovascular;  Laterality: N/A;   LEFT HEART CATH AND CORONARY ANGIOGRAPHY N/A 10/11/2020   Procedure: LEFT HEART CATH AND CORONARY ANGIOGRAPHY;  Surgeon: Yates Decamp, MD;  Location: [facility] INVASIVE CV LAB;  Service: Cardiovascular;  Laterality: N/A;   LOWER EXTREMITY ANGIOGRAM Bilateral 04/18/2012   Procedure: LOWER EXTREMITY ANGIOGRAM;  Surgeon: Chuck Hint, MD;  Location: [facility] CATH LAB;  Service: Cardiovascular;  Laterality: Bilateral;  bilat lower extrem angio   LOWER EXTREMITY ANGIOGRAPHY Bilateral 05/02/2019   Procedure: LOWER EXTREMITY ANGIOGRAPHY;  Surgeon: Yates Decamp, MD;  Location: [facility] INVASIVE CV LAB;  Service: Cardiovascular;  Laterality: Bilateral;   LOWER EXTREMITY ANGIOGRAPHY Bilateral 02/28/2019   Procedure: LOWER EXTREMITY ANGIOGRAPHY;  Surgeon: Yates Decamp, MD;  Location: [facility] INVASIVE CV LAB;  Service: Cardiovascular;   Laterality: Bilateral;   macular photocoagulation     (eye treatments for diabetic retinopathy)-Dr. Ashley Royalty   PERIPHERAL VASCULAR INTERVENTION  02/28/2019   Procedure: PERIPHERAL VASCULAR INTERVENTION;  Surgeon: Yates Decamp, MD;  Location: [facility] INVASIVE CV LAB;  Service: Cardiovascular;;   TEE WITHOUT CARDIOVERSION N/A 03/18/2022   Procedure: TRANSESOPHAGEAL ECHOCARDIOGRAM (TEE);  Surgeon: Yates Decamp, MD;  Location: [facility] ENDOSCOPY;  Service: Cardiovascular;  Laterality: N/A;   VENTRICULAR ASSIST DEVICE INSERTION N/A 12/31/2020   Procedure: VENTRICULAR ASSIST DEVICE INSERTION;  Surgeon: Yates Decamp, MD;  Location: [facility] INVASIVE CV LAB;  Service: Cardiovascular;  Laterality: N/A;   Family History  Problem Relation Age of Onset   Diabetes Mother    Hearing loss Mother    Hypertension Mother    Hyperlipidemia Mother    Heart disease Mother    Varicose Veins Mother    Varicose Veins Father    Dementia Father    Hyperlipidemia Brother    Diabetes Maternal Grandmother    Social History   Tobacco Use   Smoking status: Former    Packs/day: 1.00    Years: 30.00    Additional pack years: 0.00    Total pack years: 30.00    Types: Cigarettes    Quit  date: 01/08/2012    Years since quitting: 11.1   Smokeless tobacco: Never  Substance Use Topics   Alcohol use: Not Currently    Comment: rare   Marital Status: Widowed ROS  Review of Systems  Cardiovascular:  Negative for chest pain, claudication, leg swelling, near-syncope, orthopnea, palpitations, paroxysmal nocturnal dyspnea and syncope.  Respiratory:  Negative for shortness of breath.   Hematologic/Lymphatic: Bruises/bleeds easily.  Neurological:  Negative for dizziness.   Objective  Blood pressure 105/60, pulse 73, height 5\' 10"  (1.778 m), weight 244 lb 9.6 oz (110.9 kg), SpO2 96 %.     02/18/2023   10:31 AM 02/04/2023    2:48 PM 12/18/2022   11:34 AM  Vitals with BMI  Height 5\' 10"  5\' 10"  5\' 10"   Weight 244 lbs 10 oz 249 lbs 3 oz 240  lbs  BMI 35.1 35.76 34.44  Systolic 105 108 90  Diastolic 60 64 57  Pulse 73 70 71     Physical Exam Vitals and nursing note reviewed.  Neck:     Thyroid: No thyromegaly.  Cardiovascular:     Rate and Rhythm: Normal rate and regular rhythm.     Heart sounds: No murmur heard.    No gallop.  Pulmonary:     Effort: Pulmonary effort is normal.     Breath sounds: No wheezing, rhonchi or rales.  Musculoskeletal:        General: No tenderness.     Left lower leg: No edema.     Comments: Right BKA  Skin:    General: Skin is warm and dry.  Neurological:     Mental Status: He is alert.    Laboratory examination:   Recent Labs    03/21/22 0427 03/23/22 0813 03/30/22 0035 04/08/22 1326 04/17/22 1135 09/25/22 1224 12/09/22 0000  NA 135 136 138 142 137 143  --   K 3.9 4.1 3.9 5.6* 5.2 4.3  --   CL 102 106 108 107* 104 104  --   CO2 26 23 23 23 26 22   --   GLUCOSE 181* 118* 79 165* 205* 131*  --   BUN 31* 31* 25* 24 42* 56*  --   CREATININE 1.42* 1.36* 1.34* 1.62* 1.40* 1.51* 1.5*  CALCIUM 8.2* 8.4* 8.3* 9.2 9.0 9.3  --   GFRNONAA 54* 57* 58*  --   --   --   --    CrCl cannot be calculated (Patient's most recent lab result is older than the maximum 21 days allowed.).     Latest Ref Rng & Units 12/09/2022   12:00 AM 09/25/2022   12:24 PM 04/17/2022   11:35 AM  CMP  Glucose 70 - 99 mg/dL  116  579   BUN 8 - 27 mg/dL  56  42   Creatinine 0.6 - 1.3 1.5  1.51  1.40   Sodium 134 - 144 mmol/L  143  137   Potassium 3.5 - 5.2 mmol/L  4.3  5.2   Chloride 96 - 106 mmol/L  104  104   CO2 20 - 29 mmol/L  22  26   Calcium 8.6 - 10.2 mg/dL  9.3  9.0       Latest Ref Rng & Units 09/25/2022   12:24 PM 04/17/2022   11:35 AM 04/08/2022    1:26 PM  CBC  WBC 3.4 - 10.8 x10E3/uL 8.6  6.3  5.7   Hemoglobin 13.0 - 17.7 g/dL 03.8  33.3  83.2  Hematocrit 37.5 - 51.0 % 41.1  33.4  35.6   Platelets 150 - 450 x10E3/uL 185  236  196    Lipid Panel     Component Value Date/Time   CHOL 96  (L) 08/18/2021 1538   TRIG 138 08/18/2021 1538   HDL 30 (L) 08/18/2021 1538   CHOLHDL 3.9 06/26/2021 0949   CHOLHDL 4.4 09/28/2011 1358   VLDL 21 09/28/2011 1358   LDLCALC 42 08/18/2021 1538    HEMOGLOBIN A1C Lab Results  Component Value Date   HGBA1C 7.4 12/22/2022   MPG 162.81 03/11/2022   TSH No results for input(s): "TSH" in the last 8760 hours.  External Labs:  Glucose Random 453.000 M 09/08/2019 BUN 38.000 M 09/08/2019 Creatinine, Serum 1.340 MG/ 09/08/2019  PSA 3.300 06/12/2019 01/19/2018: Cholesterol 115, triglycerides 112, HDL 39, LDL 54.  Glucose 125, creatinine 1.16, potassium 5.2, EGFR 67, CMP otherwise normal.  CBC normal.  TSH 1.5.  Allergies   Allergies  Allergen Reactions   Shellfish-Derived Products Nausea And Vomiting and Other (See Comments)    Only Mussels cause severe nausea and vomiting    Latex Rash   Tape Rash and Other (See Comments)    Caused issues with the skin   Testosterone Rash    did not feel well     Medications Prior to Visit:   Outpatient Medications Prior to Visit  Medication Sig Dispense Refill   acetaminophen (TYLENOL) 325 MG tablet Take 1-2 tablets (325-650 mg total) by mouth every 4 (four) hours as needed for mild pain.     aspirin 81 MG chewable tablet Chew 1 tablet (81 mg total) by mouth daily.     Bioflavonoid Products (ESTER C PO) Take 1,000 mg by mouth daily.     Cholecalciferol (VITAMIN D) 2000 units CAPS Take 2,000 Units by mouth daily.     clopidogrel (PLAVIX) 75 MG tablet TAKE 1 TABLET BY MOUTH DAILY 90 tablet 1   ENTRESTO 24-26 MG TAKE 1 TABLET BY MOUTH TWICE  DAILY 180 tablet 1   ezetimibe (ZETIA) 10 MG tablet Take 1 tablet (10 mg total) by mouth daily. 90 tablet 3   gemfibrozil (LOPID) 600 MG tablet Take 600 mg by mouth 2 (two) times daily before a meal.     HUMALOG KWIKPEN 100 UNIT/ML KiwkPen Inject 3-10 Units into the skin 3 (three) times daily. Inject 01-1009 units into the skin three times a day, per sliding  scale- based on BGL >100  1   HUMULIN N KWIKPEN 100 UNIT/ML Kiwkpen Inject 5 Units into the skin every morning.  1   Krill Oil 300 MG CAPS Take 300 mg by mouth daily.     melatonin 10 MG TABS Take 10 mg by mouth at bedtime.  0   MOUNJARO 10 MG/0.5ML Pen Inject 10 mg into the skin once a week.     Multiple Vitamin (MULTIVITAMIN WITH MINERALS) TABS tablet Take 1 tablet by mouth daily.     nitroGLYCERIN (NITROSTAT) 0.4 MG SL tablet Place 1 tablet (0.4 mg total) under the tongue every 5 (five) minutes as needed for chest pain. 15 tablet 3   Probiotic Product (PROBIOTIC-10 PO) Take 1 capsule by mouth daily.     rosuvastatin (CRESTOR) 20 MG tablet Take 1 tablet (20 mg total) by mouth daily. 90 tablet 3   sodium chloride (OCEAN) 0.65 % SOLN nasal spray Place 1 spray into both nostrils as needed for congestion.     torsemide (DEMADEX) 20 MG tablet  TAKE 1 TABLET BY MOUTH TWICE  DAILY 180 tablet 3   No facility-administered medications prior to visit.   Final Medications at End of Visit    Current Meds  Medication Sig   acetaminophen (TYLENOL) 325 MG tablet Take 1-2 tablets (325-650 mg total) by mouth every 4 (four) hours as needed for mild pain.   aspirin 81 MG chewable tablet Chew 1 tablet (81 mg total) by mouth daily.   Bioflavonoid Products (ESTER C PO) Take 1,000 mg by mouth daily.   Cholecalciferol (VITAMIN D) 2000 units CAPS Take 2,000 Units by mouth daily.   clopidogrel (PLAVIX) 75 MG tablet TAKE 1 TABLET BY MOUTH DAILY   ENTRESTO 24-26 MG TAKE 1 TABLET BY MOUTH TWICE  DAILY   ezetimibe (ZETIA) 10 MG tablet Take 1 tablet (10 mg total) by mouth daily.   gemfibrozil (LOPID) 600 MG tablet Take 600 mg by mouth 2 (two) times daily before a meal.   HUMALOG KWIKPEN 100 UNIT/ML KiwkPen Inject 3-10 Units into the skin 3 (three) times daily. Inject 01-1009 units into the skin three times a day, per sliding scale- based on BGL >100   HUMULIN N KWIKPEN 100 UNIT/ML Kiwkpen Inject 5 Units into the skin  every morning.   Krill Oil 300 MG CAPS Take 300 mg by mouth daily.   melatonin 10 MG TABS Take 10 mg by mouth at bedtime.   MOUNJARO 10 MG/0.5ML Pen Inject 10 mg into the skin once a week.   Multiple Vitamin (MULTIVITAMIN WITH MINERALS) TABS tablet Take 1 tablet by mouth daily.   nitroGLYCERIN (NITROSTAT) 0.4 MG SL tablet Place 1 tablet (0.4 mg total) under the tongue every 5 (five) minutes as needed for chest pain.   Probiotic Product (PROBIOTIC-10 PO) Take 1 capsule by mouth daily.   rosuvastatin (CRESTOR) 20 MG tablet Take 1 tablet (20 mg total) by mouth daily.   sodium chloride (OCEAN) 0.65 % SOLN nasal spray Place 1 spray into both nostrils as needed for congestion.   torsemide (DEMADEX) 20 MG tablet TAKE 1 TABLET BY MOUTH TWICE  DAILY   Radiology:   DG CHEST PORT 1 VIEW 10/12/2020: COMPARISON:  10/11/2020 FINDINGS: Vascular congestion with bilateral lower lung zone airspace opacities are without change. Upper lungs are clear. Suspect small effusions.  No pneumothorax. Endotracheal tube, right internal jugular central venous line and nasal/orogastric tube are stable. IMPRESSION: 1. No change from the previous day's exam. 2. Persistent vascular congestion and lower lung zone airspace opacities, the latter finding likely due to a combination of pleural fluid with atelectasis, with a possible component of either edema or infection.   Cardiac Studies:     Exercise sestamibi stress test 08/17/2016: 1. Resting EKG demonstrates normal sinus rhythm, left axis deviation, left plantar fascicular block.  Poor R-wave progression, ulnar disease pattern.  Nonspecific ST-T abnormality, cannot exclude lateral ischemia.  Stress EKG is equivocal for ischemia, patient developing atypical left otherwise for with exercise which reverted back to baseline immediately on combination of the stress test less than 60 seconds.  There was no additional ST-T wave changes of ischemia. Patient exercised on Bruce protocol for  4:25 minutes and achieved 5.45 METS. Stress test terminated due to 89 % MPHR achieved (Target HR >85%). Symptoms included dizziness.  2.  2-Day protocol followed. Perfusion imaging studies demonstrate large sized severe perfusion defect involving the inferior, anterior and anterolateral consistent with inferior wall scar with moderate peri-infarct ischemia and severe anterior and anteroapical reversible ischemia extending from  the base towards the apex.  Left ventricular systolic function calculating by QGS was markedly depressed at 26%.  This is a high risk study, consider further cardiac work-up.   Carotid artery duplex 02/16/2018: No hemodynamically significant arterial disease in the internal carotid artery bilaterally. Mild heterogenous plaque noted.  Antegrade right vertebral artery flow. Antegrade left vertebral artery flow. Compared to the study done on 09/01/2016, left carotid noted as occlusion is an error. This may perhaps be due to low velocity noted in both carotid arteries and may indicate low systemic BP or reduced cardiac output. Clinical correlation recommended.  Peripheral arteriogram 05/02/2019: Right common femoral artery and right proximal SFA previously placed stent widely patent.  Right iliac artery shows mild disease. Left iliac artery and left femoral arteries show very mild disease.  There is two-vessel runoff in the left leg, left AT is occluded. Intermediate stenosis noted in the left distal and proximal SFA, pressure pullback reveals no significant gradient.   ABI 08/09/2019: This exam reveals normal perfusion of the right lower extremity (ABI). This exam reveals normal perfusion of the left lower extremity (ABI).  The ABI may be falsely elevated due to medial calcinosis from diabetes. No significant change since 05/11/2019. Patient has h/o right SFA stenting.   Left Heart Catheterization 10/11/20:  LV: Severely dilated.  Global hypokinesis.  Hand contrast injection  hence not fully adequately visualized.  LVEF 15 to 20%.  EDP markedly elevated at 29 mmHg.  No pressure gradient across the aortic valve. Left main: Normal. LAD: Severely diffusely diseased.  Gives origin to large D1 which gives collaterals to the LAD and 2 smaller diagonals, LAD is occluded after the origin of D1, anatomy compared to prior in 2017 reveals progression of diffuse disease.  There are ipsilateral and contralateral collaterals to the LAD. CX: Moderate sized vessel, giving origin to large OM1.  OM1 is occluded in the ostium as was noted previously and has ipsilateral collaterals.  OM1 is diffusely diseased and faintly filled. RCA: Moderate disease in the proximal and mid segment.  At the bifurcation of PDA and PL, there is a high-grade 90% stenosis.  The bifurcation is also involved with at least a 90% stenosis in the PDA and a 60 to 70% stenosis in the PL branch.  There is no target as distally as the PL branch which is large is occluded distally and that was new from 2017.  There are faint collaterals noted from the left to the RCA.   Impression: Severe native vessel three-vessel coronary artery disease with no significant targets for revascularization, LAD may be amenable for revascularization but I am not sure that this would help, anatomy similar to 2017 but progression to diffuse disease.  Patient is extremely ill with high risk for mortality.  He also has underlying stage III-IV kidney disease and contrast nephropathy needs to be evaluated further in view of contrast load of 70 mL.  Coronary Angioplasty 12/31/20 Impella support high risk intervention  Unsuccessful attempt at proximal LAD CTO intervention in spite of use of support catheters and multiple wires. 129 ml contrast used. No immediate complications.   Continue guideline directed medical therapy for heart failure discuss regarding any future attempts of revascularization.  Lower Extremity Arterial Duplex 08/14/2021:  There  is monophasic waveform pattern throughout the right lower extremity  arterial system suggesting significant proximal (Iliac artery) stenosis.  Right SFA stents appear to be widely patent.  No hemodynamically significant stenosis are identified in the left lower  extremity arterial system.  This exam reveals normal perfusion of the right lower extremity (ABI 1.00)  and mildly decreased perfusion of the left lower extremity, noted at the  post tibial artery level (ABI 0.84).  There is monophasic waveform pattern  in bilateral lower extremity at the level of the ankles. Medial calcinosis  noted in bilateral small vessels.  ABI can be falsely elevated in patients with medial calcinosis. Compared  to 04/07/2019, no change in right ABI, left ABI was 1.00.  Echocardiogram 03/18/2022:   1. Left ventricular ejection fraction, by estimation, is 20 to 25%. The  left ventricle has severely decreased function. The left ventricle  demonstrates global hypokinesis. The left ventricular internal cavity size  was severely dilated. Left ventricular  diastolic function could not be evaluated.   2. Right ventricular systolic function is normal. The right ventricular  size is normal.   3. Left atrial size was severely dilated. No left atrial/left atrial  appendage thrombus was detected.   4. The mitral valve is normal in structure. Mild to moderate mitral valve  regurgitation. No evidence of mitral stenosis.   5. Mild calcification of the non coronary cusp. The aortic valve is  tricuspid. There is mild calcification of the aortic valve. Aortic valve  regurgitation is trivial.  Conclusion(s)/Recommendation(s): No evidence of vegetation/infective  endocarditis on this transesophageael echocardiogram.   EKG   08/19/2022 NSR, Left axis, LBBB.  Inferior infarct old.  Anterior septal infarct old.  Diffuse nonspecific T wave abnormality. Unchanged  02/17/2022: Sinus rhythm at a rate of 72 bpm.  Left axis.   Inferior infarct old.  Anterior septal infarct old.  Diffuse nonspecific T wave abnormality.  08/11/2020: Sinus tachycardia at rate of 120 bpm, atypical left bundle branch block.  No further analysis.   09/13/2020: Normal sinus rhythm at rate of 91 bpm, left atrial enlargement, inferior infarct old.  Anteroseptal infarct old.  Nonspecific T abnormality.  Compared to 08/11/2020, left bundle branch block not present.  04/19/2020: Normal sinus rhythm at rate of 61 bpm, inferior infarct old.  Anteroseptal infarct old.  Nonspecific T abnormality.  Borderline low voltage complexes.  No significant change from 11/08/2019.   Assessment     ICD-10-CM   1. Chronic systolic heart failure  I50.22 EKG 12-Lead    2. Ischemic cardiomyopathy  I25.5     3. LBBB (left bundle branch block)  I44.7     4. ICD (implantable cardioverter-defibrillator) in place  Z95.810      No orders of the defined types were placed in this encounter.  There are no discontinued medications.      Recommendations:    Eduardo ClosJames M Zarazua  is a 67 y.o. Caucasian male  with controlled type 2 diabetes, hypertension, hyperlipidemia, coronary artery disease by angiography on 09/08/2016 revealing severe triple-vessel CAD with no targets for CABG, ischemic cardiomyopathy, and right BKA.   Ischemic cardiomyopathy Chronic systolic heart failure (HCC) Continue entresto, torsemide, crestor Can do torsemide once daily if BP on lower side and no swelling Continue Mounjarno Continue ASA and plavix Nitro PRN    ICD (implantable cardioverter-defibrillator) in place Follows with Dr Ladona Ridgelaylor s/p implant    Follow-up in 6 months, sooner if needed.    Clotilde DieterSabina Zackari Ruane, DO, [facility] HospitalFACC 02/18/2023, 10:52 AM Office: [phone]

## 2023-02-23 NOTE — Progress Notes (Signed)
Remote ICD transmission.   

## 2023-02-26 DIAGNOSIS — Z89511 Acquired absence of right leg below knee: Secondary | ICD-10-CM | POA: Diagnosis not present

## 2023-03-03 ENCOUNTER — Telehealth: Payer: Self-pay

## 2023-03-03 NOTE — Progress Notes (Signed)
Patient ID: Eduardo [name], male   DOB: [birth year], 67 y.o.   MRN: [id number]  Care Management & Coordination Services Pharmacy Team  Reason for Encounter: Diabetes  Contacted patient to discuss diabetes disease state. {US HC Outreach:28874}  Current antihyperglycemic regimen:  Humalog ss based on sugars  Humulin 5 units in the morning  Mounjaro 7.5mg  once weekly    Patient verbally confirms he is taking the above medications as directed. {yes/no:20286}  What diet changes have been made to improve diabetes control?  What recent interventions/DTPs have been made to improve glycemic control:  ***  Have there been any recent hospitalizations or ED visits since last visit with PharmD? {yes/no:20286}  Patient {reports/denies:24182} hypoglycemic symptoms, including {Hypoglycemic Symptoms:3049003}  Patient {reports/denies:24182} hyperglycemic symptoms, including {symptoms; hyperglycemia:17903}  How often are you checking your blood sugar? {BG Testing frequency:23922}  What are your blood sugars ranging?  Fasting: *** Before meals: *** After meals: *** Bedtime: ***  During the week, how often does your blood glucose drop below 70? {LowBGfrequency:24142}  Are you checking your feet daily/regularly? {yes/no:20286}  Adherence Review: Is the patient currently on a STATIN medication? {yes/no:20286} Is the patient currently on ACE/ARB medication? {yes/no:20286} Does the patient have >5 day gap between last estimated fill dates? {yes/no:20286}     Current antihypertensive regimen:  ***  Patient verbally confirms he is taking the above medications as directed. {yes/no:20286}  How often are you checking your Blood Pressure? {CHL HP BP Monitoring Frequency:[phone]}  he checks his blood pressure {timing:25218} {before/after:25217} taking his medication.  Current home BP readings: ***  DATE:             BP               PULSE   Wrist or arm cuff: Caffeine intake: Salt  intake: OTC medications including pseudoephedrine or NSAIDs?  Any readings above 180/100? {yes/no:20286} If yes any symptoms of hypertensive emergency? {hypertensive emergency symptoms:25354}  What recent interventions/DTPs have been made by any provider to improve Blood Pressure control since last CPP Visit: ***  Any recent hospitalizations or ED visits since last visit with CPP? {yes/no:20286}  What diet changes have been made to improve Blood Pressure Control?  ***  What exercise is being done to improve your Blood Pressure Control?  ***    Chart Updates: Recent office visits:  None  Recent consult visits:  02/18/23 Custovic, Rozell Searing, DO (Cardiology) - Patient presented for Chronic systolic heart failure and other concerns. No medication changes.  02/04/23 Marinus Maw, MD (Cardiology) - Patient presented for Chronic systolic heart failure and other concerns. Increased Tirzepatide.   01/21/23 Lenor Coffin, RN - Clinical Support encounter for Ischemic cardiomyopathy. No other visit details available.   Hospital visits:  {Hospital DC Yes/No:21091515}   Recent Office Vitals: BP Readings from Last 3 Encounters:  02/18/23 105/60  02/04/23 108/64  12/18/22 (!) 90/57   Pulse Readings from Last 3 Encounters:  02/18/23 73  02/04/23 70  12/18/22 71    Wt Readings from Last 3 Encounters:  02/18/23 244 lb 9.6 oz (110.9 kg)  02/04/23 249 lb 3.2 oz (113 kg)  12/18/22 240 lb (108.9 kg)      Medications: Outpatient Encounter Medications as of 03/03/2023  Medication Sig   acetaminophen (TYLENOL) 325 MG tablet Take 1-2 tablets (325-650 mg total) by mouth every 4 (four) hours as needed for mild pain.   aspirin 81 MG chewable tablet Chew 1 tablet (81 mg total) by mouth daily.  Bioflavonoid Products (ESTER C PO) Take 1,000 mg by mouth daily.   Cholecalciferol (VITAMIN D) 2000 units CAPS Take 2,000 Units by mouth daily.   clopidogrel (PLAVIX) 75 MG tablet TAKE 1 TABLET  BY MOUTH DAILY   ENTRESTO 24-26 MG TAKE 1 TABLET BY MOUTH TWICE  DAILY   ezetimibe (ZETIA) 10 MG tablet Take 1 tablet (10 mg total) by mouth daily.   gemfibrozil (LOPID) 600 MG tablet Take 600 mg by mouth 2 (two) times daily before a meal.   HUMALOG KWIKPEN 100 UNIT/ML KiwkPen Inject 3-10 Units into the skin 3 (three) times daily. Inject 01-1009 units into the skin three times a day, per sliding scale- based on BGL >100   HUMULIN N KWIKPEN 100 UNIT/ML Kiwkpen Inject 5 Units into the skin every morning.   Krill Oil 300 MG CAPS Take 300 mg by mouth daily.   melatonin 10 MG TABS Take 10 mg by mouth at bedtime.   MOUNJARO 10 MG/0.5ML Pen Inject 10 mg into the skin once a week.   Multiple Vitamin (MULTIVITAMIN WITH MINERALS) TABS tablet Take 1 tablet by mouth daily.   nitroGLYCERIN (NITROSTAT) 0.4 MG SL tablet Place 1 tablet (0.4 mg total) under the tongue every 5 (five) minutes as needed for chest pain.   Probiotic Product (PROBIOTIC-10 PO) Take 1 capsule by mouth daily.   rosuvastatin (CRESTOR) 20 MG tablet Take 1 tablet (20 mg total) by mouth daily.   sodium chloride (OCEAN) 0.65 % SOLN nasal spray Place 1 spray into both nostrils as needed for congestion.   torsemide (DEMADEX) 20 MG tablet TAKE 1 TABLET BY MOUTH TWICE  DAILY   No facility-administered encounter medications on file as of 03/03/2023.    Recent Relevant Labs: Lab Results  Component Value Date/Time   HGBA1C 7.4 12/22/2022 12:00 AM   HGBA1C 6.6 06/23/2022 12:00 AM   MICROALBUR 18 12/05/2021 12:00 AM   MICROALBUR 22.5 06/05/2021 12:00 AM    Kidney Function Lab Results  Component Value Date/Time   CREATININE 1.5 (A) 12/09/2022 12:00 AM   CREATININE 1.51 (H) 09/25/2022 12:24 PM   CREATININE 1.40 (H) 04/17/2022 11:35 AM   CREATININE 1.15 09/28/2011 01:58 PM   GFRNONAA 58 (L) 03/30/2022 12:35 AM   GFRAA 44 (L) 12/19/2020 10:31 AM    Star Rating Drugs:  Rosuvastatin 20 mg - Last filled 11/21/22 90 DS at [facility]   Care  Gaps: Lung Cancer Screening - Overdue AWV - 07/06/22   Pamala Duffel CMA Clinical Pharmacist Assistant [phone]

## 2023-03-04 ENCOUNTER — Ambulatory Visit: Payer: Medicare Other | Attending: Internal Medicine

## 2023-03-04 ENCOUNTER — Telehealth: Payer: Self-pay

## 2023-03-04 DIAGNOSIS — I255 Ischemic cardiomyopathy: Secondary | ICD-10-CM

## 2023-03-04 DIAGNOSIS — I472 Ventricular tachycardia, unspecified: Secondary | ICD-10-CM | POA: Diagnosis not present

## 2023-03-04 NOTE — Telephone Encounter (Signed)
Call back received from Pt.  Pt argumentative that he did not receive a shock.  Advised that he did receive treatment from his ICD.  He states he did not feel it.  He denies any symptoms around the time of the therapy.  Pt currently on Entresto.  He is not on any other rhythm medications.  Last BP's are 108/64, 90/58.  Reviewed with Dr. Ladona Ridgel.  Will have Pt come for BMP and magnesium.  Advised Pt that per [place] DMV guidelines he cannot drive a motorized vehicle for 6 months post appropriate device therapy.  Pt indicates understanding of above.  Will come for lab work today.

## 2023-03-04 NOTE — Telephone Encounter (Signed)
Alert received from [employer]:  Alert remote reviewed. Normal device function.   The patient had a fast ventricular arrhythmia that fell into the VF zone, ATP during charging failed and the arrhythmia was successfully converted after one ICD shock, sent to triage.  Outreach made to Pt.  Left message requesting call back.

## 2023-03-05 LAB — BASIC METABOLIC PANEL
BUN/Creatinine Ratio: 31 — ABNORMAL HIGH (ref 10–24)
BUN: 48 mg/dL — ABNORMAL HIGH (ref 8–27)
CO2: 24 mmol/L (ref 20–29)
Calcium: 9.1 mg/dL (ref 8.6–10.2)
Chloride: 102 mmol/L (ref 96–106)
Creatinine, Ser: 1.54 mg/dL — ABNORMAL HIGH (ref 0.76–1.27)
Glucose: 89 mg/dL (ref 70–99)
Potassium: 4.9 mmol/L (ref 3.5–5.2)
Sodium: 140 mmol/L (ref 134–144)
eGFR: 49 mL/min/{1.73_m2} — ABNORMAL LOW (ref >59)

## 2023-03-05 LAB — MAGNESIUM: Magnesium: 2.4 mg/dL — ABNORMAL HIGH (ref 1.6–2.3)

## 2023-03-05 NOTE — Telephone Encounter (Signed)
Labs reviewed by Dr. Ladona Ridgel.  Labs WNL.  Watchful waiting.  Advised Pt of results.  All questions answered.

## 2023-03-19 ENCOUNTER — Ambulatory Visit: Payer: Medicare Other | Admitting: Physical Medicine and Rehabilitation

## 2023-03-19 DIAGNOSIS — E1165 Type 2 diabetes mellitus with hyperglycemia: Secondary | ICD-10-CM | POA: Diagnosis not present

## 2023-04-02 ENCOUNTER — Encounter
Payer: Medicare Other | Attending: Physical Medicine and Rehabilitation | Admitting: Physical Medicine and Rehabilitation

## 2023-04-02 ENCOUNTER — Encounter: Payer: Self-pay | Admitting: Physical Medicine and Rehabilitation

## 2023-04-02 VITALS — BP 97/64 | HR 76 | Ht 70.0 in | Wt 234.4 lb

## 2023-04-02 DIAGNOSIS — T879 Unspecified complications of amputation stump: Secondary | ICD-10-CM

## 2023-04-02 DIAGNOSIS — R269 Unspecified abnormalities of gait and mobility: Secondary | ICD-10-CM

## 2023-04-02 DIAGNOSIS — Z89511 Acquired absence of right leg below knee: Secondary | ICD-10-CM

## 2023-04-02 MED ORDER — TRAMADOL HCL 50 MG PO TABS: 50.0000 mg | ORAL_TABLET | Freq: Two times a day (BID) | ORAL | 0 refills | Status: DC | PRN

## 2023-04-02 NOTE — Patient Instructions (Signed)
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 because has NO padding on tip/bottom of R tibia end- more pronounced/tip of R tibia as time goes on- and now getting skin breakdown in spite of using tegaderm to protect tissue.   2. Con't - will rewrite for tramadol- will give tramadol 50 mg BID prn- #50- no refills- since doesn't use much.   3. Call Dr Lajoyce Corners- and see if he can assess if pt needs revision surgery. If not, can see what else he needs- if cannot do revision surgery?   4. Discussed options for R BKA- already custom liner- and new custom socket- but still getting skin breakdown.   5. Let me know what the plan is. Can message me via my chart.   6. F/U in 3 months- on R BKA

## 2023-04-02 NOTE — Progress Notes (Signed)
Subjective:    Patient ID: Eduardo [name], male    DOB: 26-Apr-1956, 67 y.o.   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    Finally out of Tramadol- last got last year-10/16/22 doesn't take often-  At end of day, pain is worse lately.    Pain Inventory Average Pain 1 Pain Right Now 1 My pain is dull  In the last 24 hours, has pain interfered with the following? General activity 0 Relation with others 0 Enjoyment of life 0 What TIME of day is your pain at its worst? evening Sleep (in general) Good  Pain is worse with: walking Pain improves with: rest and medication Relief from Meds: 10  Family History  Problem Relation Age of Onset   Diabetes Mother    Hearing loss Mother    Hypertension Mother    Hyperlipidemia Mother    Heart disease Mother    Varicose Veins Mother    Varicose Veins Father    Dementia Father    Hyperlipidemia Brother    Diabetes Maternal Grandmother    Social History   Socioeconomic History   Marital status: Widowed    Spouse name: Not on file   Number of children: 0   Years of education: Not on file   Highest education level: Not on file  Occupational History   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 Retired. No pets. Enjoys fishing.   Updated 06/2021   Social Determinants of Health   Financial Resource Strain: Low Risk  (12/31/2022)   Overall Financial Resource Strain (CARDIA)    Difficulty of Paying Living Expenses: Not hard at all  Food Insecurity: No Food Insecurity (12/31/2022)   Hunger Vital Sign    Worried About Running Out of Food in the Last Year: Never true    Ran Out of Food in the Last Year: Never true  Transportation Needs: No Transportation Needs (12/31/2022)   PRAPARE - Administrator, Civil Service (Medical): No    Lack of Transportation (Non-Medical): No  Physical Activity: Not on file  Stress: Not on file  Social Connections: Not on file   Past Surgical History:  Procedure Laterality Date   ABDOMINAL AORTAGRAM N/A 04/18/2012   Procedure: ABDOMINAL Ronny Flurry;  Surgeon: Cristal Deer  Adele Dan, MD;  Location: [facility] CATH LAB;  Service: Cardiovascular;  Laterality: N/A;   AMPUTATION Right 05/19/2019   Procedure: RIGHT FOOT 5TH RAY AMPUTATION;  Surgeon: Nadara Mustard, MD;  Location: [facility] OR;  Service: Orthopedics;  Laterality: Right;   AMPUTATION Right 03/11/2022   Procedure: RIGHT LEG DEBRIDEMENT VS. BELOW KNEE AMPUTATION;  Surgeon: Nadara Mustard, MD;  Location: [facility] OR;  Service: Orthopedics;  Laterality: Right;   BIV ICD INSERTION CRT-D N/A 10/21/2022   Procedure: BIV ICD INSERTION CRT-D;  Surgeon: Marinus Maw, MD;  Location: [facility] INVASIVE CV LAB;  Service: Cardiovascular;  Laterality: N/A;   CARDIAC CATHETERIZATION N/A 09/08/2016   Procedure: Left Heart Cath and Coronary Angiography;  Surgeon: Yates Decamp, MD;  Location: [facility] INVASIVE CV LAB;  Service: Cardiovascular;  Laterality: N/A;   CATARACT EXTRACTION, BILATERAL  09/2017, 10/2017   Dr.  Elmer Picker   COLONOSCOPY W/ BIOPSIES AND POLYPECTOMY     CORONARY/GRAFT ACUTE MI REVASCULARIZATION N/A 10/11/2020   Procedure: Coronary/Graft Acute MI Revascularization;  Surgeon: Yates Decamp, MD;  Location: [facility] INVASIVE CV LAB;  Service: Cardiovascular;  Laterality: N/A;   I & D EXTREMITY Right 03/11/2022   Procedure: BELOW KNEE AMPUTATION;  Surgeon: Nadara Mustard, MD;  Location: [facility] OR;  Service: Orthopedics;  Laterality: Right;   INSERT / REPLACE / REMOVE PACEMAKER     LEFT HEART CATH N/A 12/31/2020   Procedure: Left Heart Cath;  Surgeon: Yates Decamp, MD;  Location: [facility] INVASIVE CV LAB;  Service: Cardiovascular;  Laterality: N/A;   LEFT HEART CATH AND CORONARY ANGIOGRAPHY N/A 10/11/2020   Procedure: LEFT HEART CATH AND CORONARY ANGIOGRAPHY;  Surgeon: Yates Decamp, MD;  Location: [facility] INVASIVE CV LAB;  Service: Cardiovascular;  Laterality: N/A;   LOWER EXTREMITY ANGIOGRAM Bilateral 04/18/2012   Procedure: LOWER EXTREMITY ANGIOGRAM;  Surgeon: Chuck Hint, MD;  Location: [facility] CATH LAB;  Service: Cardiovascular;  Laterality: Bilateral;  bilat lower extrem angio   LOWER EXTREMITY ANGIOGRAPHY Bilateral 05/02/2019   Procedure: LOWER EXTREMITY ANGIOGRAPHY;  Surgeon: Yates Decamp, MD;  Location: [facility] INVASIVE CV LAB;  Service: Cardiovascular;  Laterality: Bilateral;   LOWER EXTREMITY ANGIOGRAPHY Bilateral 02/28/2019   Procedure: LOWER EXTREMITY ANGIOGRAPHY;  Surgeon: Yates Decamp, MD;  Location: [facility] INVASIVE CV LAB;  Service: Cardiovascular;  Laterality: Bilateral;   macular photocoagulation     (eye treatments for diabetic retinopathy)-Dr. Ashley Royalty   PERIPHERAL VASCULAR INTERVENTION  02/28/2019   Procedure: PERIPHERAL VASCULAR INTERVENTION;  Surgeon: Yates Decamp, MD;  Location: [facility] INVASIVE CV LAB;  Service: Cardiovascular;;   TEE WITHOUT CARDIOVERSION N/A 03/18/2022   Procedure: TRANSESOPHAGEAL ECHOCARDIOGRAM (TEE);  Surgeon: Yates Decamp, MD;  Location: [facility] ENDOSCOPY;  Service: Cardiovascular;  Laterality: N/A;    VENTRICULAR ASSIST DEVICE INSERTION N/A 12/31/2020   Procedure: VENTRICULAR ASSIST DEVICE INSERTION;  Surgeon: Yates Decamp, MD;  Location: [facility] INVASIVE CV LAB;  Service: Cardiovascular;  Laterality: N/A;   Past Surgical History:  Procedure Laterality Date   ABDOMINAL AORTAGRAM N/A 04/18/2012   Procedure: ABDOMINAL AORTAGRAM;  Surgeon: Chuck Hint, MD;  Location: [facility] CATH LAB;  Service: Cardiovascular;  Laterality: N/A;   AMPUTATION Right 05/19/2019   Procedure: RIGHT FOOT 5TH RAY AMPUTATION;  Surgeon: Nadara Mustard, MD;  Location: [facility] OR;  Service: Orthopedics;  Laterality: Right;   AMPUTATION Right 03/11/2022   Procedure: RIGHT LEG DEBRIDEMENT VS. BELOW KNEE AMPUTATION;  Surgeon: Nadara Mustard, MD;  Location: [facility] OR;  Service: Orthopedics;  Laterality: Right;   BIV ICD INSERTION CRT-D N/A  10/21/2022   Procedure: BIV ICD INSERTION CRT-D;  Surgeon: Marinus Maw, MD;  Location: [facility] INVASIVE CV LAB;  Service: Cardiovascular;  Laterality: N/A;   CARDIAC CATHETERIZATION N/A 09/08/2016   Procedure: Left Heart Cath and Coronary Angiography;  Surgeon: Yates Decamp, MD;  Location: [facility] INVASIVE CV LAB;  Service: Cardiovascular;  Laterality: N/A;   CATARACT EXTRACTION, BILATERAL  09/2017, 10/2017   Dr. Elmer Picker   COLONOSCOPY W/ BIOPSIES AND POLYPECTOMY     CORONARY/GRAFT ACUTE MI REVASCULARIZATION N/A 10/11/2020   Procedure: Coronary/Graft Acute MI Revascularization;  Surgeon: Yates Decamp, MD;  Location: [facility] INVASIVE CV LAB;  Service: Cardiovascular;  Laterality: N/A;   I & D EXTREMITY Right 03/11/2022   Procedure: BELOW KNEE AMPUTATION;  Surgeon: Nadara Mustard, MD;  Location: [facility] OR;  Service: Orthopedics;  Laterality: Right;   INSERT / REPLACE / REMOVE PACEMAKER     LEFT HEART CATH N/A 12/31/2020   Procedure: Left Heart Cath;  Surgeon: Yates Decamp, MD;  Location: [facility] INVASIVE CV LAB;  Service: Cardiovascular;  Laterality: N/A;   LEFT HEART CATH AND CORONARY ANGIOGRAPHY N/A 10/11/2020   Procedure:  LEFT HEART CATH AND CORONARY ANGIOGRAPHY;  Surgeon: Yates Decamp, MD;  Location: [facility] INVASIVE CV LAB;  Service: Cardiovascular;  Laterality: N/A;   LOWER EXTREMITY ANGIOGRAM Bilateral 04/18/2012   Procedure: LOWER EXTREMITY ANGIOGRAM;  Surgeon: Chuck Hint, MD;  Location: [facility] CATH LAB;  Service: Cardiovascular;  Laterality: Bilateral;  bilat lower extrem angio   LOWER EXTREMITY ANGIOGRAPHY Bilateral 05/02/2019   Procedure: LOWER EXTREMITY ANGIOGRAPHY;  Surgeon: Yates Decamp, MD;  Location: [facility] INVASIVE CV LAB;  Service: Cardiovascular;  Laterality: Bilateral;   LOWER EXTREMITY ANGIOGRAPHY Bilateral 02/28/2019   Procedure: LOWER EXTREMITY ANGIOGRAPHY;  Surgeon: Yates Decamp, MD;  Location: [facility] INVASIVE CV LAB;  Service: Cardiovascular;  Laterality: Bilateral;   macular photocoagulation     (eye treatments for diabetic retinopathy)-Dr. Ashley Royalty   PERIPHERAL VASCULAR INTERVENTION  02/28/2019   Procedure: PERIPHERAL VASCULAR INTERVENTION;  Surgeon: Yates Decamp, MD;  Location: [facility] INVASIVE CV LAB;  Service: Cardiovascular;;   TEE WITHOUT CARDIOVERSION N/A 03/18/2022   Procedure: TRANSESOPHAGEAL ECHOCARDIOGRAM (TEE);  Surgeon: Yates Decamp, MD;  Location: [facility] ENDOSCOPY;  Service: Cardiovascular;  Laterality: N/A;   VENTRICULAR ASSIST DEVICE INSERTION N/A 12/31/2020   Procedure: VENTRICULAR ASSIST DEVICE INSERTION;  Surgeon: Yates Decamp, MD;  Location: [facility] INVASIVE CV LAB;  Service: Cardiovascular;  Laterality: N/A;   Past Medical History:  Diagnosis Date   CHF (congestive heart failure) (HCC)    Chronic kidney disease    stage 3   Colon polyp    Coronary artery disease    Diabetes mellitus 1987   under care of Dr. Talmage Nap.  On insulin since 96 (off and on)   Diabetic retinopathy    Dupuytren contracture    R hand, s/p injection (Dr. Izora Ribas)   Essential hypertension, benign    Essential hypertension, benign 02/06/2019   Frequency of urination and polyuria    Hypertension    Myocardial infarction [facility])     denies   Neuromuscular disorder (HCC)    Diabetic neuropathy   Osteomyelitis (HCC)    right foot   Other testicular hypofunction    Peripheral arterial disease (HCC) 10/28/2012   Peritoneal abscess (HCC) 6/08   and buttock.   Pneumonia    Polydipsia    Proteinuria    Pure hyperglyceridemia    Subacute osteomyelitis, right ankle and foot (HCC)    Wears glasses    BP  97/64   Pulse 76   Ht 5\' 10"  (1.778 m)   Wt 234 lb 6.4 oz (106.3 kg)   SpO2 97%   BMI 33.63 kg/m   Opioid Risk Score:   Fall Risk Score:  `1  Depression screen [facility] 2/9     09/18/2022   10:58 AM 07/06/2022    9:42 AM 05/18/2022   11:28 AM 06/26/2021    8:45 AM 06/24/2020    8:40 AM 06/12/2019    8:52 AM 06/06/2018    8:47 AM  Depression screen PHQ 2/9  Decreased Interest 0 0 0 0 0 0 0  Down, Depressed, Hopeless 0 0 0 0 0 0 0  PHQ - 2 Score 0 0 0 0 0 0 0  Altered sleeping   0      Tired, decreased energy   0      Change in appetite   0      Feeling bad or failure about yourself    0      Trouble concentrating   0      Moving slowly or fidgety/restless   0      Suicidal thoughts   0      PHQ-9 Score   0         Review of Systems  Musculoskeletal:        Stump pain  All other systems reviewed and are negative.     Objective:   Physical Exam  R BKA- having separation of R BKA incision- and having breakdown on almost fo tip of distal end of tibia- - with Stage II pressure ulcer- ~ 1/2 dime sized area- with yellow slough. 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 because has NO padding on tip/bottom of R tibia end- more pronounced/tip of R tibia as time goes on- and now getting skin breakdown in spite of using tegaderm to protect tissue.   2. Con't -  will rewrite for tramadol- will give tramadol 50 mg BID prn- #50- no refills- since doesn't use much.   3. Call Dr Lajoyce Corners- and see if he can assess if pt needs revision surgery. If not, can see what else he needs- if cannot do revision surgery?   4. Discussed options for R BKA- already custom liner- and new custom socket- but still getting skin breakdown.   5. Let me know what the plan is. Can message me via my chart.   6. F/U in 3 months- on R BKA    I spent a total of 23   minutes on total care today- >50% coordination of care- due to dealing with complications of R BKA and skin breakdown and what to do next- calling Dr Audrie Lia to see if revision is possible.

## 2023-04-07 ENCOUNTER — Telehealth: Payer: Self-pay | Admitting: *Deleted

## 2023-04-07 NOTE — Telephone Encounter (Signed)
Margurite Auerbach (Key: [facility]) Need Help? Call us at [phone] Status sent iconSent to Plan today

## 2023-04-09 NOTE — Telephone Encounter (Signed)
This medication or product is on your plan's list of covered drugs. Prior authorization is not required at this time. If your pharmacy has questions regarding the processing of your prescription or if the request is for [employer] Milligram Morphine Equivalent (MME) review, please call the [facility] pharmacy help desk at [phone] for further assistance.

## 2023-04-15 ENCOUNTER — Ambulatory Visit: Payer: Medicare Other | Admitting: Orthopedic Surgery

## 2023-04-15 DIAGNOSIS — Z89511 Acquired absence of right leg below knee: Secondary | ICD-10-CM

## 2023-04-19 DIAGNOSIS — E1165 Type 2 diabetes mellitus with hyperglycemia: Secondary | ICD-10-CM | POA: Diagnosis not present

## 2023-04-22 ENCOUNTER — Ambulatory Visit (INDEPENDENT_AMBULATORY_CARE_PROVIDER_SITE_OTHER): Payer: Medicare Other

## 2023-04-22 DIAGNOSIS — I2510 Atherosclerotic heart disease of native coronary artery without angina pectoris: Secondary | ICD-10-CM | POA: Diagnosis not present

## 2023-04-22 DIAGNOSIS — I739 Peripheral vascular disease, unspecified: Secondary | ICD-10-CM | POA: Diagnosis not present

## 2023-04-22 DIAGNOSIS — I255 Ischemic cardiomyopathy: Secondary | ICD-10-CM | POA: Diagnosis not present

## 2023-04-22 DIAGNOSIS — Z89421 Acquired absence of other right toe(s): Secondary | ICD-10-CM | POA: Diagnosis not present

## 2023-04-22 DIAGNOSIS — N189 Chronic kidney disease, unspecified: Secondary | ICD-10-CM | POA: Diagnosis not present

## 2023-04-22 DIAGNOSIS — E1165 Type 2 diabetes mellitus with hyperglycemia: Secondary | ICD-10-CM | POA: Diagnosis not present

## 2023-04-22 DIAGNOSIS — L97509 Non-pressure chronic ulcer of other part of unspecified foot with unspecified severity: Secondary | ICD-10-CM | POA: Diagnosis not present

## 2023-04-22 DIAGNOSIS — I10 Essential (primary) hypertension: Secondary | ICD-10-CM | POA: Diagnosis not present

## 2023-04-22 DIAGNOSIS — S88911A Complete traumatic amputation of right lower leg, level unspecified, initial encounter: Secondary | ICD-10-CM | POA: Diagnosis not present

## 2023-04-22 DIAGNOSIS — R809 Proteinuria, unspecified: Secondary | ICD-10-CM | POA: Diagnosis not present

## 2023-04-22 DIAGNOSIS — E7800 Pure hypercholesterolemia, unspecified: Secondary | ICD-10-CM | POA: Diagnosis not present

## 2023-04-22 LAB — HEMOGLOBIN A1C: Hemoglobin A1C: 6.9

## 2023-04-26 ENCOUNTER — Encounter: Payer: Self-pay | Admitting: Orthopedic Surgery

## 2023-04-26 NOTE — Progress Notes (Signed)
Office Visit Note   Patient: Eduardo [name]           Date of Birth: [birth year]           MRN: [id number] Visit Date: 04/15/2023              Requested by: Nadara Mustard, MD [address] [place],  [place] [address] PCP: Joselyn Arrow, MD  Chief Complaint  Patient presents with   Right Leg - Follow-up    Hx of right BKA [place] 03/11/2022      HPI: Patient is a 67 year old gentleman who is 1 year follow-up status post right transtibial amputation on Mar 11, 2022.  Patient is a [facility] patient.  Assessment & Plan: Visit Diagnoses:  1. S/P below knee amputation, right [facility])     Plan: Patient will follow-up with [facility] for socket modifications.  Follow-Up Instructions: No follow-ups on file.   Ortho Exam  Patient is alert, oriented, no adenopathy, well-dressed, normal affect, normal respiratory effort. Examination patient has a well-healed residual limb.  Range of motion 0 to 130 degrees.  Patient has developed a superficial ulcer on the end of the residual limb on the right secondary to subsiding in his socket.  He does have a new sleeve and liner.  Imaging: No results found. No images are attached to the encounter.  Labs: Lab Results  Component Value Date   HGBA1C 7.4 12/22/2022   HGBA1C 6.6 06/23/2022   HGBA1C 7.3 (H) 03/11/2022   REPTSTATUS 03/17/2022 FINAL 03/12/2022   GRAMSTAIN  09/16/2021    FEW WBC PRESENT, PREDOMINANTLY MONONUCLEAR RARE GRAM POSITIVE COCCI FEW GRAM NEGATIVE RODS    CULT  03/12/2022    NO GROWTH 5 DAYS Performed at [facility], [address], [place], [place] [address]    [facility] STREPTOCOCCUS ANGINOSIS 03/08/2022     Lab Results  Component Value Date   ALBUMIN 3.6 (L) 04/08/2022   ALBUMIN 2.6 (L) 03/23/2022   ALBUMIN 2.1 (L) 03/11/2022   PREALBUMIN 6.5 (L) 03/11/2022    Lab Results  Component Value Date   MG 2.4 (H) 03/04/2023   MG 2.2 03/11/2022   MG 2.2 03/10/2022   Lab Results  Component Value Date    VD25OH 75.05 03/11/2022   VD25OH 54.2 12/05/2021    Lab Results  Component Value Date   PREALBUMIN 6.5 (L) 03/11/2022      Latest Ref Rng & Units 09/25/2022   12:24 PM 04/17/2022   11:35 AM 04/08/2022    1:26 PM  CBC EXTENDED  WBC 3.4 - 10.8 x10E3/uL 8.6  6.3  5.7   RBC 4.14 - 5.80 x10E6/uL 4.49  3.60  3.91   Hemoglobin 13.0 - 17.7 g/dL 96.2  95.2  84.1   HCT 37.5 - 51.0 % 41.1  33.4  35.6   Platelets 150 - 450 x10E3/uL 185  236  196   NEUT# 1.4 - 7.0 x10E3/uL 6.3  4.3  3.3   Lymph# 0.7 - 3.1 x10E3/uL 1.3  1.3  1.5      There is no height or weight on file to calculate BMI.  Orders:  No orders of the defined types were placed in this encounter.  No orders of the defined types were placed in this encounter.    Procedures: No procedures performed  Clinical Data: No additional findings.  ROS:  All other systems negative, except as noted in the HPI. Review of Systems  Objective: Vital Signs: There were no  vitals taken for this visit.  Specialty Comments:  No specialty comments available.  PMFS History: Patient Active Problem List   Diagnosis Date Noted   ICD (implantable cardioverter-defibrillator) in place 02/04/2023   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   Chronic post-operative pain 09/18/2022   Abnormality of gait 09/18/2022   LBBB (left bundle branch block) 08/19/2022   Chronic systolic heart failure (HCC) 08/19/2022   Chronic right-sided congestive heart failure (HCC) 05/18/2022   Ejection fraction < 50% 04/17/2022   Insomnia    Constipation    S/P BKA (below knee amputation), right (HCC) 03/21/2022   Ischemic cardiomyopathy    AKI (acute kidney injury) (HCC) 03/13/2022   Streptococcal bacteremia 03/13/2022   Hypophosphatemia 03/13/2022   Septic shock (HCC) 03/08/2022   Acute systolic heart failure (HCC) 12/28/2020   Acute pulmonary edema (HCC) 10/11/2020   Non-ST elevation (NSTEMI) myocardial infarction (HCC)  10/11/2020   Acute respiratory distress 10/11/2020   Acute on chronic combined systolic and diastolic CHF (congestive heart failure) (HCC) 10/11/2020   Cardiogenic shock (HCC)    Partial nontraumatic amputation of foot, right (HCC) 06/21/2019   Osteomyelitis of right foot (HCC)    Essential hypertension, benign 02/06/2019   Hypercholesteremia 02/06/2019   Stage 3b chronic kidney disease (CKD) (HCC) 10/06/2017   Proteinuria 10/06/2017   Controlled type 2 diabetes mellitus with hyperglycemia, with long-term current use of insulin (HCC) 10/06/2017   Coronary artery disease involving native coronary artery of native heart without angina pectoris 05/23/2017   Abnormal nuclear stress test 09/06/2016   PAD (peripheral artery disease) (HCC) 10/28/2012   Morbid obesity with BMI of 40.0-44.9, adult (HCC) 10/13/2012   Diabetes mellitus with ophthalmic complication (HCC) 10/13/2012   Hypertension associated with diabetes (HCC) 10/13/2012   Atherosclerosis of native artery of extremity with intermittent claudication (HCC) 04/05/2012   Adenomatous colon polyp 09/28/2011   Diabetes mellitus (HCC) 09/28/2011   Pure hyperglyceridemia 09/28/2011   Erectile dysfunction 09/28/2011   Diabetic retinopathy (HCC) 09/28/2011   Past Medical History:  Diagnosis Date   CHF (congestive heart failure) (HCC)    Chronic kidney disease    stage 3   Colon polyp    Coronary artery disease    Diabetes mellitus 1987   under care of Dr. Talmage Nap.  On insulin since 96 (off and on)   Diabetic retinopathy    Dupuytren contracture    R hand, s/p injection (Dr. Izora Ribas)   Essential hypertension, benign    Essential hypertension, benign 02/06/2019   Frequency of urination and polyuria    Hypertension    Myocardial infarction [facility])    denies   Neuromuscular disorder (HCC)    Diabetic neuropathy   Osteomyelitis (HCC)    right foot   Other testicular hypofunction    Peripheral arterial disease (HCC) 10/28/2012    Peritoneal abscess (HCC) 6/08   and buttock.   Pneumonia    Polydipsia    Proteinuria    Pure hyperglyceridemia    Subacute osteomyelitis, right ankle and foot (HCC)    Wears glasses     Family History  Problem Relation Age of Onset   Diabetes Mother    Hearing loss Mother    Hypertension Mother    Hyperlipidemia Mother    Heart disease Mother    Varicose Veins Mother    Varicose Veins Father    Dementia Father    Hyperlipidemia Brother    Diabetes Maternal Grandmother     Past Surgical History:  Procedure Laterality Date   ABDOMINAL AORTAGRAM N/A 04/18/2012   Procedure: ABDOMINAL Ronny Flurry;  Surgeon: Chuck Hint, MD;  Location: [facility] CATH LAB;  Service: Cardiovascular;  Laterality: N/A;   AMPUTATION Right 05/19/2019   Procedure: RIGHT FOOT 5TH RAY AMPUTATION;  Surgeon: Nadara Mustard, MD;  Location: [facility] OR;  Service: Orthopedics;  Laterality: Right;   AMPUTATION Right 03/11/2022   Procedure: RIGHT LEG DEBRIDEMENT VS. BELOW KNEE AMPUTATION;  Surgeon: Nadara Mustard, MD;  Location: [facility] OR;  Service: Orthopedics;  Laterality: Right;   BIV ICD INSERTION CRT-D N/A 10/21/2022   Procedure: BIV ICD INSERTION CRT-D;  Surgeon: Marinus Maw, MD;  Location: [facility] INVASIVE CV LAB;  Service: Cardiovascular;  Laterality: N/A;   CARDIAC CATHETERIZATION N/A 09/08/2016   Procedure: Left Heart Cath and Coronary Angiography;  Surgeon: Yates Decamp, MD;  Location: [facility] INVASIVE CV LAB;  Service: Cardiovascular;  Laterality: N/A;   CATARACT EXTRACTION, BILATERAL  09/2017, 10/2017   Dr. Elmer Picker   COLONOSCOPY W/ BIOPSIES AND POLYPECTOMY     CORONARY/GRAFT ACUTE MI REVASCULARIZATION N/A 10/11/2020   Procedure: Coronary/Graft Acute MI Revascularization;  Surgeon: Yates Decamp, MD;  Location: [facility] INVASIVE CV LAB;  Service: Cardiovascular;  Laterality: N/A;   I & D EXTREMITY Right 03/11/2022   Procedure: BELOW KNEE AMPUTATION;  Surgeon: Nadara Mustard, MD;  Location: [facility] OR;  Service: Orthopedics;   Laterality: Right;   INSERT / REPLACE / REMOVE PACEMAKER     LEFT HEART CATH N/A 12/31/2020   Procedure: Left Heart Cath;  Surgeon: Yates Decamp, MD;  Location: [facility] INVASIVE CV LAB;  Service: Cardiovascular;  Laterality: N/A;   LEFT HEART CATH AND CORONARY ANGIOGRAPHY N/A 10/11/2020   Procedure: LEFT HEART CATH AND CORONARY ANGIOGRAPHY;  Surgeon: Yates Decamp, MD;  Location: [facility] INVASIVE CV LAB;  Service: Cardiovascular;  Laterality: N/A;   LOWER EXTREMITY ANGIOGRAM Bilateral 04/18/2012   Procedure: LOWER EXTREMITY ANGIOGRAM;  Surgeon: Chuck Hint, MD;  Location: [facility] CATH LAB;  Service: Cardiovascular;  Laterality: Bilateral;  bilat lower extrem angio   LOWER EXTREMITY ANGIOGRAPHY Bilateral 05/02/2019   Procedure: LOWER EXTREMITY ANGIOGRAPHY;  Surgeon: Yates Decamp, MD;  Location: [facility] INVASIVE CV LAB;  Service: Cardiovascular;  Laterality: Bilateral;   LOWER EXTREMITY ANGIOGRAPHY Bilateral 02/28/2019   Procedure: LOWER EXTREMITY ANGIOGRAPHY;  Surgeon: Yates Decamp, MD;  Location: [facility] INVASIVE CV LAB;  Service: Cardiovascular;  Laterality: Bilateral;   macular photocoagulation     (eye treatments for diabetic retinopathy)-Dr. Ashley Royalty   PERIPHERAL VASCULAR INTERVENTION  02/28/2019   Procedure: PERIPHERAL VASCULAR INTERVENTION;  Surgeon: Yates Decamp, MD;  Location: [facility] INVASIVE CV LAB;  Service: Cardiovascular;;   TEE WITHOUT CARDIOVERSION N/A 03/18/2022   Procedure: TRANSESOPHAGEAL ECHOCARDIOGRAM (TEE);  Surgeon: Yates Decamp, MD;  Location: [facility] ENDOSCOPY;  Service: Cardiovascular;  Laterality: N/A;   VENTRICULAR ASSIST DEVICE INSERTION N/A 12/31/2020   Procedure: VENTRICULAR ASSIST DEVICE INSERTION;  Surgeon: Yates Decamp, MD;  Location: [facility] INVASIVE CV LAB;  Service: Cardiovascular;  Laterality: N/A;   Social History   Occupational History   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

## 2023-05-05 LAB — CUP PACEART REMOTE DEVICE CHECK
Date Time Interrogation Session: 20240618093407
Implantable Lead Connection Status: 753985
Implantable Lead Connection Status: 753985
Implantable Lead Connection Status: 753985
Implantable Lead Implant Date: 20231213
Implantable Lead Implant Date: 20231213
Implantable Lead Implant Date: 20231213
Implantable Lead Location: 753858
Implantable Lead Location: 753859
Implantable Lead Location: 753860
Implantable Pulse Generator Implant Date: 20231213
Pulse Gen Serial Number: 211000872

## 2023-05-11 ENCOUNTER — Encounter: Payer: Self-pay | Admitting: Family Medicine

## 2023-05-12 NOTE — Progress Notes (Signed)
Remote ICD transmission.   

## 2023-05-19 DIAGNOSIS — E1165 Type 2 diabetes mellitus with hyperglycemia: Secondary | ICD-10-CM | POA: Diagnosis not present

## 2023-06-09 DIAGNOSIS — I129 Hypertensive chronic kidney disease with stage 1 through stage 4 chronic kidney disease, or unspecified chronic kidney disease: Secondary | ICD-10-CM | POA: Diagnosis not present

## 2023-06-09 DIAGNOSIS — D631 Anemia in chronic kidney disease: Secondary | ICD-10-CM | POA: Diagnosis not present

## 2023-06-09 DIAGNOSIS — I5022 Chronic systolic (congestive) heart failure: Secondary | ICD-10-CM | POA: Diagnosis not present

## 2023-06-09 DIAGNOSIS — N2581 Secondary hyperparathyroidism of renal origin: Secondary | ICD-10-CM | POA: Diagnosis not present

## 2023-06-09 DIAGNOSIS — N189 Chronic kidney disease, unspecified: Secondary | ICD-10-CM | POA: Diagnosis not present

## 2023-06-09 DIAGNOSIS — E1122 Type 2 diabetes mellitus with diabetic chronic kidney disease: Secondary | ICD-10-CM | POA: Diagnosis not present

## 2023-06-09 DIAGNOSIS — N1831 Chronic kidney disease, stage 3a: Secondary | ICD-10-CM | POA: Diagnosis not present

## 2023-06-14 ENCOUNTER — Encounter (INDEPENDENT_AMBULATORY_CARE_PROVIDER_SITE_OTHER): Payer: Medicare Other | Admitting: Ophthalmology

## 2023-06-14 DIAGNOSIS — E113592 Type 2 diabetes mellitus with proliferative diabetic retinopathy without macular edema, left eye: Secondary | ICD-10-CM | POA: Diagnosis not present

## 2023-06-14 DIAGNOSIS — E113391 Type 2 diabetes mellitus with moderate nonproliferative diabetic retinopathy without macular edema, right eye: Secondary | ICD-10-CM | POA: Diagnosis not present

## 2023-06-14 DIAGNOSIS — I10 Essential (primary) hypertension: Secondary | ICD-10-CM

## 2023-06-14 DIAGNOSIS — H43813 Vitreous degeneration, bilateral: Secondary | ICD-10-CM | POA: Diagnosis not present

## 2023-06-14 DIAGNOSIS — H35033 Hypertensive retinopathy, bilateral: Secondary | ICD-10-CM

## 2023-06-14 DIAGNOSIS — Z794 Long term (current) use of insulin: Secondary | ICD-10-CM

## 2023-06-16 LAB — HM DIABETES EYE EXAM

## 2023-06-19 DIAGNOSIS — E1165 Type 2 diabetes mellitus with hyperglycemia: Secondary | ICD-10-CM | POA: Diagnosis not present

## 2023-07-02 ENCOUNTER — Encounter
Payer: Medicare Other | Attending: Physical Medicine and Rehabilitation | Admitting: Physical Medicine and Rehabilitation

## 2023-07-02 ENCOUNTER — Encounter: Payer: Self-pay | Admitting: Physical Medicine and Rehabilitation

## 2023-07-02 VITALS — BP 93/60 | HR 65 | Ht 70.0 in | Wt 228.0 lb

## 2023-07-02 DIAGNOSIS — Z89511 Acquired absence of right leg below knee: Secondary | ICD-10-CM | POA: Diagnosis not present

## 2023-07-02 DIAGNOSIS — R269 Unspecified abnormalities of gait and mobility: Secondary | ICD-10-CM | POA: Diagnosis not present

## 2023-07-02 DIAGNOSIS — G546 Phantom limb syndrome with pain: Secondary | ICD-10-CM | POA: Insufficient documentation

## 2023-07-02 DIAGNOSIS — I739 Peripheral vascular disease, unspecified: Secondary | ICD-10-CM | POA: Diagnosis not present

## 2023-07-02 NOTE — Patient Instructions (Signed)
Pt is a 67 yr old male with recent R BKA in May 2023; , also has hx of dCHF, DM; on AC, PAD; AC held due to pec tear;  Here for f/u on R BKA   If taking tramadol at all, will need to see q6 months.  2. Doing well, so doesn't need intervention today.    3. Con't Tramadol as needed- last refill 04/02/23- has used 3 since then, so doesn't need UDS today.   4. Don't prescribe any other meds- con't regimen from PCP- and Cards. Esp Torsemide.   5. F/U q6 months- f/u on R BKA

## 2023-07-02 NOTE — Progress Notes (Signed)
Subjective:    Patient ID: Eduardo [name], male    DOB: 12/24/1955, 67 y.o.   MRN: [id number]  HPI  Pt is a 67 yr old male with recent R BKA in May 2023; , also has hx of dCHF, DM; on AC, PAD; AC held due to pec tear;  Here for f/u on R BKA    Saw Dr Lajoyce [place] Spaniel was getting shear effect and  should heal- which it has.  Suggested underliner- which is wearing.    Walking going well Wearing prosthesis 12-16 hours/day.  No problems   Not using tramadol much- used 3x since last seen  Most of time uses tylenol.  As a general, it's an ache at best- There have been time, that needs tramadol- pain is worse.  Most times, due to a lot of walking- cannot guarantee that's why, but appears to be, usually.   Usually with fishing, when doesn't know where going and needs to carry a chair.  Sometimes needs to put another sock on.     Pain Inventory Average Pain 1 Pain Right Now 0 My pain is intermittent and aching  In the last 24 hours, has pain interfered with the following? General activity 0 Relation with others 0 Enjoyment of life 0 What TIME of day is your pain at its worst? varies Sleep (in general) Good  Pain is worse with: walking Pain improves with: rest Relief from Meds: 7  Family History  Problem Relation Age of Onset   Diabetes Mother    Hearing loss Mother    Hypertension Mother    Hyperlipidemia Mother    Heart disease Mother    Varicose Veins Mother    Varicose Veins Father    Dementia Father    Hyperlipidemia Brother    Diabetes Maternal Grandmother    Social History   Socioeconomic History   Marital status: Widowed    Spouse name: Not on file   Number of children: 0   Years of education: Not on file   Highest education level: Not on file  Occupational History   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 Retired. No pets. Enjoys fishing.   Updated 06/2021   Social Determinants of Health   Financial Resource Strain: Low Risk  (12/31/2022)   Overall Financial Resource Strain (CARDIA)    Difficulty of Paying Living Expenses: Not hard at all  Food Insecurity: No Food Insecurity (12/31/2022)   Hunger Vital Sign    Worried About Running Out of Food in the Last Year: Never true    Ran Out of Food in the Last Year: Never true  Transportation Needs: No Transportation Needs (12/31/2022)   PRAPARE - Administrator, Civil Service (Medical): No  Lack of Transportation (Non-Medical): No  Physical Activity: Not on file  Stress: Not on file  Social Connections: Not on file   Past Surgical History:  Procedure Laterality Date   ABDOMINAL AORTAGRAM N/A 04/18/2012   Procedure: ABDOMINAL Ronny Flurry;  Surgeon: Chuck Hint, MD;  Location: [facility] CATH LAB;  Service: Cardiovascular;  Laterality: N/A;   AMPUTATION Right 05/19/2019   Procedure: RIGHT FOOT 5TH RAY AMPUTATION;  Surgeon: Nadara Mustard, MD;  Location: [facility] OR;  Service: Orthopedics;  Laterality: Right;   AMPUTATION Right 03/11/2022   Procedure: RIGHT LEG DEBRIDEMENT VS. BELOW KNEE AMPUTATION;  Surgeon: Nadara Mustard, MD;  Location[facility] OR;  Service: Orthopedics;  Laterality: Right;   BIV ICD INSERTION CRT-D N/A 10/21/2022   Procedure: BIV ICD INSERTION CRT-D;  Surgeon: Marinus Maw, MD;  Location: [facility] INVASIVE CV LAB;  Service: Cardiovascular;  Laterality: N/A;    CARDIAC CATHETERIZATION N/A 09/08/2016   Procedure: Left Heart Cath and Coronary Angiography;  Surgeon: Yates Decamp, MD;  Location: [facility] INVASIVE CV LAB;  Service: Cardiovascular;  Laterality: N/A;   CATARACT EXTRACTION, BILATERAL  09/2017, 10/2017   Dr. Elmer Picker   COLONOSCOPY W/ BIOPSIES AND POLYPECTOMY     CORONARY/GRAFT ACUTE MI REVASCULARIZATION N/A 10/11/2020   Procedure: Coronary/Graft Acute MI Revascularization;  Surgeon: Yates Decamp, MD;  Location: [facility] INVASIVE CV LAB;  Service: Cardiovascular;  Laterality: N/A;   I & D EXTREMITY Right 03/11/2022   Procedure: BELOW KNEE AMPUTATION;  Surgeon: Nadara Mustard, MD;  Location: [facility] OR;  Service: Orthopedics;  Laterality: Right;   INSERT / REPLACE / REMOVE PACEMAKER     LEFT HEART CATH N/A 12/31/2020   Procedure: Left Heart Cath;  Surgeon: Yates Decamp, MD;  [facility] INVASIVE CV LAB;  Service: Cardiovascular;  Laterality: N/A;   LEFT HEART CATH AND CORONARY ANGIOGRAPHY N/A 10/11/2020   Procedure: LEFT HEART CATH AND CORONARY ANGIOGRAPHY;  Surgeon: Yates Decamp, MD;  [facility] INVASIVE CV LAB;  Service: Cardiovascular;  Laterality: N/A;   LOWER EXTREMITY ANGIOGRAM Bilateral 04/18/2012   Procedure: LOWER EXTREMITY ANGIOGRAM;  Surgeon: Chuck Hint, MD;  Location: [facility] CATH LAB;  Service: Cardiovascular;  Laterality: Bilateral;  bilat lower extrem angio   LOWER EXTREMITY ANGIOGRAPHY Bilateral 05/02/2019   Procedure: LOWER EXTREMITY ANGIOGRAPHY;  Surgeon: Yates Decamp, MD;  Location: [facility] INVASIVE CV LAB;  Service: Cardiovascular;  Laterality: Bilateral;   LOWER EXTREMITY ANGIOGRAPHY Bilateral 02/28/2019   Procedure: LOWER EXTREMITY ANGIOGRAPHY;  Surgeon: Yates Decamp, MD;  Location: [facility] INVASIVE CV LAB;  Service: Cardiovascular;  Laterality: Bilateral;   macular photocoagulation     (eye treatments for diabetic retinopathy)-Dr. Ashley Royalty   PERIPHERAL VASCULAR INTERVENTION  02/28/2019   Procedure: PERIPHERAL VASCULAR INTERVENTION;  Surgeon: Yates Decamp,  MD;  [facility]: [facility] INVASIVE CV LAB;  Service: Cardiovascular;;   TEE WITHOUT CARDIOVERSION N/A 03/18/2022   Procedure: TRANSESOPHAGEAL ECHOCARDIOGRAM (TEE);  Surgeon: Yates Decamp, MD;  Location: [facility] ENDOSCOPY;  Service: Cardiovascular;  Laterality: N/A;   VENTRICULAR ASSIST DEVICE INSERTION N/A 12/31/2020   Procedure: VENTRICULAR ASSIST DEVICE INSERTION;  Surgeon: Yates Decamp, MD;  [facility] INVASIVE CV LAB;  Service: Cardiovascular;  Laterality: N/A;   Past Surgical History:  Procedure Laterality Date   ABDOMINAL AORTAGRAM N/A 04/18/2012   Procedure: ABDOMINAL AORTAGRAM;  Surgeon: Chuck Hint, MD;  Location: [facility] CATH LAB;  Service: Cardiovascular;  Laterality: N/A;   AMPUTATION Right 05/19/2019   Procedure: RIGHT FOOT 5TH RAY AMPUTATION;  Surgeon: Nadara Mustard, MD;  Location: [facility]  OR;  Service: Orthopedics;  Laterality: Right;   AMPUTATION Right 03/11/2022   Procedure: RIGHT LEG DEBRIDEMENT VS. BELOW KNEE AMPUTATION;  Surgeon: Nadara Mustard, MD;  Location: [facility] OR;  Service: Orthopedics;  Laterality: Right;   BIV ICD INSERTION CRT-D N/A 10/21/2022   Procedure: BIV ICD INSERTION CRT-D;  Surgeon: Marinus Maw, MD;  Location: [facility] INVASIVE CV LAB;  Service: Cardiovascular;  Laterality: N/A;   CARDIAC CATHETERIZATION N/A 09/08/2016   Procedure: Left Heart Cath and Coronary Angiography;  Surgeon: Yates Decamp, MD;  Location: [facility] INVASIVE CV LAB;  Service: Cardiovascular;  Laterality: N/A;   CATARACT EXTRACTION, BILATERAL  09/2017, 10/2017   Dr. Elmer Picker   COLONOSCOPY W/ BIOPSIES AND POLYPECTOMY     CORONARY/GRAFT ACUTE MI REVASCULARIZATION N/A 10/11/2020   Procedure: Coronary/Graft Acute MI Revascularization;  Surgeon: Yates Decamp, MD;  Location: [facility] INVASIVE CV LAB;  Service: Cardiovascular;  Laterality: N/A;   I & D EXTREMITY Right 03/11/2022   Procedure: BELOW KNEE AMPUTATION;  Surgeon: Nadara Mustard, MD;  Location: [facility] OR;  Service: Orthopedics;  Laterality: Right;   INSERT / REPLACE /  REMOVE PACEMAKER     LEFT HEART CATH N/A 12/31/2020   Procedure: Left Heart Cath;  Surgeon: Yates Decamp, MD;  Location: [facility] INVASIVE CV LAB;  Service: Cardiovascular;  Laterality: N/A;   LEFT HEART CATH AND CORONARY ANGIOGRAPHY N/A 10/11/2020   Procedure: LEFT HEART CATH AND CORONARY ANGIOGRAPHY;  Surgeon: Yates Decamp, MD;  [facility] INVASIVE CV LAB;  Service: Cardiovascular;  Laterality: N/A;   LOWER EXTREMITY ANGIOGRAM Bilateral 04/18/2012   Procedure: LOWER EXTREMITY ANGIOGRAM;  Surgeon: Chuck Hint, MD;  Location: [facility] CATH LAB;  Service: Cardiovascular;  Laterality: Bilateral;  bilat lower extrem angio   LOWER EXTREMITY ANGIOGRAPHY Bilateral 05/02/2019   Procedure: LOWER EXTREMITY ANGIOGRAPHY;  Surgeon: Yates Decamp, MD;  [facility] INVASIVE CV LAB;  Service: Cardiovascular;  Laterality: Bilateral;   LOWER EXTREMITY ANGIOGRAPHY Bilateral 02/28/2019   Procedure: LOWER EXTREMITY ANGIOGRAPHY;  Surgeon: Yates Decamp, MD;  Location[facility] INVASIVE CV LAB;  Service: Cardiovascular;  Laterality: Bilateral;   macular photocoagulation     (eye treatments for diabetic retinopathy)-Dr. Ashley Royalty   PERIPHERAL VASCULAR INTERVENTION  02/28/2019   Procedure: PERIPHERAL VASCULAR INTERVENTION;  Surgeon: Yates Decamp, MD;  [facility] INVASIVE CV LAB;  Service: Cardiovascular;;   TEE WITHOUT CARDIOVERSION N/A 03/18/2022   Procedure: TRANSESOPHAGEAL ECHOCARDIOGRAM (TEE);  Surgeon: Yates Decamp, MD;  Location: [facility] ENDOSCOPY;  Service: Cardiovascular;  Laterality: N/A;   VENTRICULAR ASSIST DEVICE INSERTION N/A 12/31/2020   Procedure: VENTRICULAR ASSIST DEVICE INSERTION;  Surgeon: Yates Decamp, MD;  Location: [facility] INVASIVE CV LAB;  Service: Cardiovascular;  Laterality: N/A;   Past Medical History:  Diagnosis Date   CHF (congestive heart failure) (HCC)    Chronic kidney disease    stage 3   Colon polyp    Coronary artery disease    Diabetes mellitus 1987   under care of Dr. Talmage Nap.  On insulin since 96 (off and  on)   Diabetic retinopathy    Dupuytren contracture    R hand, s/p injection (Dr. Izora Ribas)   Essential hypertension, benign    Essential hypertension, benign 02/06/2019   Frequency of urination and polyuria    Hypertension    Myocardial infarction [facility])    denies   Neuromuscular disorder (HCC)    Diabetic neuropathy   Osteomyelitis (HCC)    right foot   Other testicular hypofunction    Peripheral arterial disease (HCC) 10/28/2012  Peritoneal abscess (HCC) 6/08   and buttock.   Pneumonia    Polydipsia    Proteinuria    Pure hyperglyceridemia    Subacute osteomyelitis, right ankle and foot (HCC)    Wears glasses    BP 93/60   Pulse 65   Ht 5\' 10"  (1.778 m)   Wt 228 lb (103.4 kg)   SpO2 95%   BMI 32.71 kg/m   Opioid Risk Score:   Fall Risk Score:  `1  Depression screen [facility] 2/9     09/18/2022   10:58 AM 07/06/2022    9:42 AM 05/18/2022   11:28 AM 06/26/2021    8:45 AM 06/24/2020    8:40 AM 06/12/2019    8:52 AM 06/06/2018    8:47 AM  Depression screen [facility] 2/9  Decreased Interest 0 0 0 0 0 0 0  Down, Depressed, Hopeless 0 0 0 0 0 0 0  PHQ - 2 Score 0 0 0 0 0 0 0  Altered sleeping   0      Tired, decreased energy   0      Change in appetite   0      Feeling bad or failure about yourself    0      Trouble concentrating   0      Moving slowly or fidgety/restless   0      Suicidal thoughts   0      PHQ-9 Score   0        Review of Systems An entire ROS was completed and found to be negative except for HPI    Objective:   Physical Exam  Awake, alert, appropriate, NAD Wearing R BKA prosthesis   Bright affect      Assessment & Plan:   Pt is a 67 yr old male with recent R BKA in May 2023; , also has hx of dCHF, DM; on AC, PAD; AC held due to pec tear;  Here for f/u on R BKA   If taking tramadol at all, will need to see q6 months.  2. Doing well, so doesn't need intervention today.    3. Con't Tramadol as needed- last refill 04/02/23- has used 3 since then,  so doesn't need UDS today.   4. Don't prescribe any other meds- con't regimen from PCP- and Cards. Esp Torsemide.   5. F/U q6 months- f/u on R BKA  I spent a total of 21   minutes on total care today- >50% coordination of care- due to  Discussed his walking skills and uneven terrain, and treatments and how to manage on walking without AD.

## 2023-07-08 ENCOUNTER — Other Ambulatory Visit: Payer: Self-pay | Admitting: Internal Medicine

## 2023-07-15 ENCOUNTER — Other Ambulatory Visit: Payer: Self-pay | Admitting: Internal Medicine

## 2023-07-22 ENCOUNTER — Ambulatory Visit (INDEPENDENT_AMBULATORY_CARE_PROVIDER_SITE_OTHER): Payer: Medicare Other

## 2023-07-22 DIAGNOSIS — I255 Ischemic cardiomyopathy: Secondary | ICD-10-CM | POA: Diagnosis not present

## 2023-07-22 LAB — CUP PACEART REMOTE DEVICE CHECK
Battery Remaining Longevity: 78 mo
Battery Remaining Percentage: 84 %
Battery Voltage: 2.98 V
Brady Statistic AP VP Percent: 7.1 %
Brady Statistic AP VS Percent: 1 %
Brady Statistic AS VP Percent: 89 %
Brady Statistic AS VS Percent: 1.6 %
Brady Statistic RA Percent Paced: 4.5 %
Date Time Interrogation Session: 20240912020026
HighPow Impedance: 63 Ohm
Implantable Lead Connection Status: 753985
Implantable Lead Connection Status: 753985
Implantable Lead Connection Status: 753985
Implantable Lead Implant Date: 20231213
Implantable Lead Implant Date: 20231213
Implantable Lead Implant Date: 20231213
Implantable Lead Location: 753858
Implantable Lead Location: 753859
Implantable Lead Location: 753860
Implantable Pulse Generator Implant Date: 20231213
Lead Channel Impedance Value: 350 Ohm
Lead Channel Impedance Value: 360 Ohm
Lead Channel Impedance Value: 710 Ohm
Lead Channel Pacing Threshold Amplitude: 0.75 V
Lead Channel Pacing Threshold Amplitude: 0.75 V
Lead Channel Pacing Threshold Amplitude: 1 V
Lead Channel Pacing Threshold Pulse Width: 0.5 ms
Lead Channel Pacing Threshold Pulse Width: 0.5 ms
Lead Channel Pacing Threshold Pulse Width: 0.5 ms
Lead Channel Sensing Intrinsic Amplitude: 11.9 mV
Lead Channel Sensing Intrinsic Amplitude: 3.1 mV
Lead Channel Setting Pacing Amplitude: 2 V
Lead Channel Setting Pacing Amplitude: 2 V
Lead Channel Setting Pacing Amplitude: 2 V
Lead Channel Setting Pacing Pulse Width: 0.5 ms
Lead Channel Setting Pacing Pulse Width: 0.5 ms
Lead Channel Setting Sensing Sensitivity: 0.5 mV
Pulse Gen Serial Number: 211000872

## 2023-07-27 NOTE — Patient Instructions (Incomplete)
  HEALTH MAINTENANCE RECOMMENDATIONS:  It is recommended that you get at least 30 minutes of aerobic exercise at least 5 days/week (for weight loss, you may need as much as 60-90 minutes). This can be any activity that gets your heart rate up. This can be divided in 10-15 minute intervals if needed, but try and build up your endurance at least once a week.  Weight bearing exercise is also recommended twice weekly.  Eat a healthy diet with lots of vegetables, fruits and fiber.  "Colorful" foods have a lot of vitamins (ie green vegetables, tomatoes, red peppers, etc).  Limit sweet tea, regular sodas and alcoholic beverages, all of which has a lot of calories and sugar.  Up to 2 alcoholic drinks daily may be beneficial for men (unless trying to lose weight, watch sugars).  Drink a lot of water.  Sunscreen of at least SPF 30 should be used on all sun-exposed parts of the skin when outside between the hours of 10 am and 4 pm (not just when at beach or pool, but even with exercise, golf, tennis, and yard work!)  Use a sunscreen that says "broad spectrum" so it covers both UVA and UVB rays, and make sure to reapply every 1-2 hours.  Remember to change the batteries in your smoke detectors when changing your clock times in the spring and fall.  Carbon monoxide detectors are recommended for your home.  Use your seat belt every time you are in a car, and please drive safely and not be distracted with cell phones and texting while driving.    Eduardo [name] , Thank you for taking time to come for your Medicare Wellness Visit. I appreciate your ongoing commitment to your health goals. Please review the following plan we discussed and let me know if I can assist you in the future.   This is a list of the screening recommended for you and due dates:  Health Maintenance  Topic Date Due   Screening for Lung Cancer  Never done   Flu Shot  06/10/2023   Hemoglobin A1C  06/22/2023   COVID-19 Vaccine (6 - 2023-24  season) 07/11/2023   Complete foot exam   07/07/2023   Eye exam for diabetics  07/08/2023   Yearly kidney health urinalysis for diabetes  12/10/2023   Yearly kidney function blood test for diabetes  06/09/2024   Medicare Annual Wellness Visit  07/27/2024   Colon Cancer Screening  11/22/2024   DTaP/Tdap/Td vaccine (4 - Td or Tdap) 05/06/2032   Pneumonia Vaccine  Completed   Hepatitis C Screening  Completed   Zoster (Shingles) Vaccine  Completed   HPV Vaccine  Aged Out   You are eligible for lung cancer screening with CT scan. We discussed this at today's visit.  If you change your mind and would like to pursue this, let us know.  Try using antifungal cream (clotrimazole or lamisil) twice daily to the belly button for a couple of weeks to see if the rash clears up.  Please get [place] copies of your updated living will and healthcare power of attorney.

## 2023-07-27 NOTE — Progress Notes (Unsigned)
No chief complaint on file.   Eduardo [name] is a 67 y.o. male who presents for complete physical, Medicare annual wellness visit, as well as follow up on his chronic problems.  Patient is s/p R BKA in 03/2022.  Doing well with prosthesis. Rarely has pain that requires tramadol (has for prn use).  DM is managed by Dr. Talmage Nap. Last A1c was 6.9% in June (down from 7.4% in 12/2022. He remains on Mounjaro, current dose is 10 mg?? and insulin--N 5 U qAM, and sliding scale regular. Update doses   He has CGM. Sugars are running 120-140 on average, lowest in the 90's. No longer having hypoglycemia. Hoping he will ultimately be able to get off insulin as the Mounjaro dose is titrated up.  Denies polydipsia, urinary frequency only due to diuretic. Under the care of Dr. Ashley Royalty for diabetic retinopathy  He has peripheral neuropathy with numbness in his L foot, not painful.    CAD, CHF, ischemic cardiomyopathy: He has 3V CAD, under the care of Dr. Jacinto Halim. He had new onset LBBB and acute fulminant pulmonary edema in 10/2020. Emergent cardiac catheterization showed progression of severe native vessel disease, no significant option for revascularization (LAD was potentially amenable but RCA was felt to be the culprit). He continues with medical management per cardiologists. He was hospitalized in 12/2020 with systolic heart failure (due to ischemic cardiomyopathy). Attempted PCI to LAD CTO, unsuccessful.  EF <20% in 12/2020. Decided against VAD, heart transplant. Has ICD. No longer seeing Dr. Gala Romney. Torsemide and spironolactone were started, the latter was stopped due to elevated K+.  Last saw Dr. Melton Alar in 02/2023. He remains on entresto, torsemide (which he can take once daily if BP is on lower side and no swelling), crestor, aspirin and plavix, NTG prn. (Carvedilol was stopped due to low BP's), and confirmed in past w/ cardiologist that he should remain off of it. Jardiance was stopped as there was  concern it was contributing to his ulcers.)  He denies chest pain, DOE, syncope, orthopnea.   PAD, under the care of Dr. Jacinto Halim.  S/p R stent 02/2019. 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 He denies any further skin breakdown or ulcers. He checks his stump and LLE regularly.  Stage 3 CKD--under the care of Dr. [name] Colt. Last notes we received were from 11/2022. He has Lokelma on hand if needed, but K+ has been normal.  He is not interested in dialysis if it comes to that (was told he couldn't do HD, wouldn't want PD). Lab Results  Component Value Date   CREATININE 1.54 (H) 03/04/2023    Obesity: He continues on Weight Watchers (started January, 2018; initial weight was 336#.) He watches his food choices and portion control.  He is also losing weight on Mounjaro.   HTN--BP's have been   He denies dizziness, headaches.  BP Readings from Last 3 Encounters:  07/02/23 93/60  04/02/23 97/64  02/18/23 105/60   Mixed hyperlipidemia:  He is compliant with gemfibrozil 600mg  BID, fish oil once daily, crestor 20mg  and zetia.  Zetia was added 06/2021, due to LDL being >70. This is managed by cardiology.  He is following a lowfat, low cholesterol diet. Last check on this regimen was in 12/2022 through Dr. Talmage Nap: 12/22/2022 11:48 AM)           Value Reference Range          Cholesterol, Total 88 L 100-199 - mg/dL  Triglycerides 54   0-149 - mg/dL          HDL Cholesterol 42   >39 - mg/dL          VLDL Cholesterol Cal 13   5-40 - mg/dL          LDL Chol Calc (NIH) 33   0-99 - mg/dL     Dupuytren's contractures--Can't straighten right pinkie or left ring finger.  There is some discomfort/aching, relieved by aspercreme or Voltaren gel.  Prior injection with Dr. Izora Ribas wasn't beneficial (only slight improvement, temporarily). Not bothersome, he is able to fish, doesn't limit his activities. Unchanged from last year.  Erectile dysfunction:  Not on meds (due to CAD, nitrates).   Offered referral to urologist in the past to discuss other options, but he declined.  He isn't concerned enough about it right now.   Immunization History  Administered Date(s) Administered   COVID-19, mRNA, vaccine(Comirnaty)12 years and older 09/10/2022   Fluad Quad(high Dose 65+) 09/10/2022   Influenza Split 08/10/2011, 08/19/2020   Influenza, Quadrivalent, Recombinant, Inj, Pf 08/21/2019, 08/19/2020   Influenza,inj,Quad PF,6+ Mos 10/15/2016, 10/15/2017, 08/09/2018   Influenza-Unspecified 09/01/2021   PFIZER(Purple Top)SARS-COV-2 Vaccination 01/31/2020, 02/28/2020, 10/10/2020   Pfizer Covid-19 Vaccine Bivalent Booster 99yrs & up 07/17/2021   Pneumococcal Conjugate-13 05/25/2017   Pneumococcal Polysaccharide-23 09/28/2011, 06/26/2021   Respiratory Syncytial Virus Vaccine,Recomb Aduvanted(Arexvy) 09/10/2022   Td 06/03/2005   Tdap 09/28/2011, 05/06/2022   Zoster Recombinant(Shingrix) 06/06/2018, 08/09/2018   Last colonoscopy: 11/2014, Dr.Magod-ext and int hemorrhoids and diverticulosis. (that was a 3 yr f/u, h/o polyps in past).  Was due again 2021. Discussed him not being a good candidate at 06/2021 visit.  Last PSA:  had been rising by 0.5 per year. Declined further checking given comorbidities/life expectancy. Prefers to still have prostate exam performed. Lab Results  Component Value Date   PSA1 3.8 06/24/2020   PSA1 3.3 06/12/2019   PSA1 2.8 06/06/2018   PSA 2.3 05/25/2017   PSA 1.09 09/28/2011   Dentist: twice yearly Ophtho: regularly (due to retinopathy). Exercise: limited to physical therapy currently, and home exercises.   Patient Care Team: Joselyn Arrow, MD as PCP - General (Family Medicine) Dorisann Frames, MD as Attending Physician (Endocrinology) Sherrie George, MD (Ophthalmology) Yates Decamp, MD as Consulting Physician (Cardiology) Bufford Buttner, MD as Consulting Physician (Nephrology) [place], Elvin So, MD as Referring Physician (Dermatology) Vida Rigger, MD as Consulting Physician (Gastroenterology) Vivi Barrack, DPM as Consulting Physician (Podiatry) Sherrill Raring, [facility] (Pharmacist)  Ortho: Dr. Lajoyce Corners Dentist: Dr. Drinda Butts at [facility]--looking for new one  Depression Screening: Flowsheet Row Office Visit from 09/18/2022 in [facility]  PHQ-2 Total Score 0        Falls screen:     04/02/2023    1:06 PM 12/31/2022   11:55 AM 12/18/2022   11:36 AM 09/18/2022   10:58 AM 07/06/2022    9:41 AM  Fall Risk   Falls in the past year? 0 1 1 0 0  Comment     almost fall-6/23 "caught himself"  Number falls in past yr:  0 0  0  Injury with Fall?  0 0  0  Risk for fall due to :     No Fall Risks  Follow up     Falls evaluation completed     Functional Status Survey:         End of Life Discussion:  Patient has a living  will and HCPOA, scanned. MOST updated 06/2021 to reflect that he is willing to be intubated and go to ICU, but DNR if arrests. No longterm use of life support. Patient has DNR (dated 10/2020)     PMH, PSH, SH and FH were reviewed and updated.     ROS:  The patient denies anorexia, fever, headaches, decreased hearing, ear pain, hoarseness, chest pain, palpitations, dizziness, syncope, dyspnea on exertion, cough, swelling, nausea, vomiting, diarrhea, constipation, abdominal pain, melena, hematochezia, indigestion/heartburn, hematuria, incontinence, nocturia, dysuria, genital lesions, joint pains, weakness, tremor, suspicious skin lesions, depression, anxiety, abnormal bleeding/bruising, or enlarged lymph nodes. Denies significant vision changes. Erectile dysfunction per HPI. Not interested in treatment at this time.  Numbness in L foot.  No phantom limb pain on R. Has minimal, intermittent discomfort when using prosthetic. Bleeding if he bangs himself, no other bleeding/bruising. Some constipation, passes "pellets".  Uses stool softeners.   PHYSICAL  EXAM:  There were no vitals taken for this visit.  Wt Readings from Last 3 Encounters:  07/02/23 228 lb (103.4 kg)  04/02/23 234 lb 6.4 oz (106.3 kg)  02/18/23 244 lb 9.6 oz (110.9 kg)   Weight is with his prosthetic.  General Appearance:    Alert, cooperative, no distress, appears stated age. Head is bald/shaven  Head:    Normocephalic, without obvious abnormality, atraumatic  Eyes:    PERRL, conjunctiva/corneas clear, EOM's intact, fundi not visualized  Ears:    Normal TM's and external ear canals  Nose:   Nornal, no drainage or sinus tenderness  Throat:   Normal mucosa, no lesions  Neck:   Supple, no lymphadenopathy;  thyroid:  no enlargement/ tenderness/nodules; no carotid bruit or JVD  Back:    Spine nontender, no curvature, ROM normal, no CVA   tenderness  Lungs:     Clear to auscultation bilaterally without wheezes, rales or ronchi; respirations unlabored  Chest Wall:    No tenderness or deformity   Heart:    Regular rate and rhythm, S1 and S2 normal, no murmur, rub or gallop  Breast Exam:    No chest wall masses or bruising  Abdomen:     Soft, non-tender, obese; nondistended, normoactive bowel sounds, no masses, no hepatosplenomegaly  Genitalia:    Normal male external genitalia without lesions.  Testicles without masses.  No inguinal hernias.  Rectal:    Normal sphincter tone, no masses or tenderness; guaiac negative stool.  Prostate smooth, no nodules, not enlarged.  Extremities:  No clubbing, cyanosis. RLE: WHSS at R stump. Small area of erythema at the inferiormost point ,intact skin. o edema on R.  Trace pretibial edema on LLE.  Pulses:   Palpable distal pulses LLE, though diminished.   Skin:   Turgor normal, no suspicious lesions. Purpura on LLE  Lymph nodes:   Cervical, supraclavicular,and inguinal lymph nodes normal  Neurologic:    Normal strength, gait; reflexes 2+ and symmetric throughout.                                Psych:   Normal mood, affect, hygiene and  grooming.   Last year: *** Abnormal diabetic foot exam of L foot--decreased sensation (only could feel laterally on mid-foot, slight at 1st MPJ). Discolored/thickened L 1st and 3rd toenails.  UPDATE EXT, skin (purpura on LLE?) DIABETIC FOOT EXAM?   ASSESSMENT/PLAN:  Abstract A1c 6.9% on 04/22/23 C-met, A1c and lipids to be done in 10/2023 by  Dr. Talmage Nap Meds not rx'd by me. CBC by nephro? PSA--discuss, likely doesn't need  Did he see nephro (Dr. [name] Colt) in July/Aug?  No notes scanned, ?if received. Unsure if they did labs, cbc. (Last notes we have were from 12/09/22 and rec 6 mo f/u)  Need to get DM eye exam report and abstract  ***discuss/decline lung cancer screening  Discussed PSA screening (risks/benefits), screening not indicated at this time (based on life expectancy/comorbidities), pt elected to have DRE performed today, normal. Recommended at least 30 minutes of aerobic activity at least 5 days/week; proper sunscreen use reviewed; healthy diet and alcohol recommendations (less than or equal to 2 drinks/day) reviewed; regular seatbelt use; changing batteries in smoke detectors. Immunization recommendations discussed--continue yearly high dose flu shots.  Updated COVID booster  Colonoscopy recommendations reviewed, was due 11/2019.  Not a good candidate at this time for screening.  To refer to GI if develops GI symptoms/issues warranting evaluation.   MOST form reviewed (our copy signed, pt has original at home, didn't bring).  Pt is DNR, may intubate, no prolonged measures.   Medicare Attestation I have personally reviewed: The patient's medical and social history Their use of alcohol, tobacco or illicit drugs Their current medications and supplements The patient's functional ability including ADLs,fall risks, home safety risks, cognitive, and hearing and visual impairment Diet and physical activities Evidence for depression or mood disorders  The patient's weight, height,  BMI have been recorded in the chart.  I have made referrals, counseling, and provided education to the patient based on review of the above and I have provided the patient with a written personalized care plan for preventive services.     Lavonda Jumbo, MD

## 2023-07-28 ENCOUNTER — Encounter: Payer: Self-pay | Admitting: *Deleted

## 2023-07-28 ENCOUNTER — Ambulatory Visit (INDEPENDENT_AMBULATORY_CARE_PROVIDER_SITE_OTHER): Payer: Medicare Other | Admitting: Family Medicine

## 2023-07-28 ENCOUNTER — Encounter: Payer: Self-pay | Admitting: Family Medicine

## 2023-07-28 VITALS — BP 120/60 | HR 72 | Ht 69.0 in | Wt 230.0 lb

## 2023-07-28 DIAGNOSIS — Z0000 Encounter for general adult medical examination without abnormal findings: Secondary | ICD-10-CM

## 2023-07-28 DIAGNOSIS — I5022 Chronic systolic (congestive) heart failure: Secondary | ICD-10-CM

## 2023-07-28 DIAGNOSIS — Z95810 Presence of automatic (implantable) cardiac defibrillator: Secondary | ICD-10-CM

## 2023-07-28 DIAGNOSIS — E1169 Type 2 diabetes mellitus with other specified complication: Secondary | ICD-10-CM | POA: Diagnosis not present

## 2023-07-28 DIAGNOSIS — N1831 Chronic kidney disease, stage 3a: Secondary | ICD-10-CM

## 2023-07-28 DIAGNOSIS — E782 Mixed hyperlipidemia: Secondary | ICD-10-CM | POA: Diagnosis not present

## 2023-07-28 DIAGNOSIS — E1159 Type 2 diabetes mellitus with other circulatory complications: Secondary | ICD-10-CM

## 2023-07-28 DIAGNOSIS — Z89511 Acquired absence of right leg below knee: Secondary | ICD-10-CM

## 2023-07-28 DIAGNOSIS — Z794 Long term (current) use of insulin: Secondary | ICD-10-CM | POA: Diagnosis not present

## 2023-07-28 DIAGNOSIS — Z23 Encounter for immunization: Secondary | ICD-10-CM | POA: Diagnosis not present

## 2023-07-28 DIAGNOSIS — I739 Peripheral vascular disease, unspecified: Secondary | ICD-10-CM

## 2023-07-28 DIAGNOSIS — I152 Hypertension secondary to endocrine disorders: Secondary | ICD-10-CM

## 2023-07-30 NOTE — Progress Notes (Signed)
Remote ICD transmission.   

## 2023-07-31 ENCOUNTER — Encounter: Payer: Self-pay | Admitting: Family Medicine

## 2023-08-20 ENCOUNTER — Ambulatory Visit: Payer: Self-pay | Admitting: Cardiology

## 2023-08-24 ENCOUNTER — Telehealth: Payer: Self-pay

## 2023-08-24 ENCOUNTER — Ambulatory Visit: Payer: Medicare Other | Attending: Cardiology | Admitting: Cardiology

## 2023-08-24 ENCOUNTER — Encounter: Payer: Self-pay | Admitting: Cardiology

## 2023-08-24 VITALS — BP 82/48 | HR 66 | Ht 69.0 in | Wt 227.2 lb

## 2023-08-24 DIAGNOSIS — I5022 Chronic systolic (congestive) heart failure: Secondary | ICD-10-CM

## 2023-08-24 DIAGNOSIS — I2510 Atherosclerotic heart disease of native coronary artery without angina pectoris: Secondary | ICD-10-CM | POA: Diagnosis not present

## 2023-08-24 DIAGNOSIS — I255 Ischemic cardiomyopathy: Secondary | ICD-10-CM

## 2023-08-24 MED ORDER — TORSEMIDE 20 MG PO TABS: 20.0000 mg | ORAL_TABLET | Freq: Every day | ORAL | 3 refills | Status: DC

## 2023-08-24 NOTE — Addendum Note (Signed)
Addended by: Elder Negus on: 08/24/2023 01:04 PM   Modules accepted: Orders

## 2023-08-24 NOTE — Patient Instructions (Signed)
Medication Instructions:  Your physician has recommended you make the following change in your medication:  1-Change Demadex 20 mg by mouth daily, take additional one tablet for swelling.  *If you need a refill on your cardiac medications before your next appointment, please call your pharmacy*   Lab Work: If you have labs (blood work) drawn today and your tests are completely normal, you will receive your results only by: MyChart Message (if you have MyChart) OR A paper copy in the mail If you have any lab test that is abnormal or we need to change your treatment, we will call you to review the results.  Testing/Procedures: Your physician has requested that you have an echocardiogram. Echocardiography is a painless test that uses sound waves to create images of your heart. It provides your doctor with information about the size and shape of your heart and how well your heart's chambers and valves are working. This procedure takes approximately one hour. There are no restrictions for this procedure. Please do NOT wear cologne, perfume, aftershave, or lotions (deodorant is allowed). Please arrive 15 minutes prior to your appointment time.  Follow-Up: At [facility], you and your health needs are our priority.  As part of our continuing mission to provide you with exceptional heart care, we have created designated Provider Care Teams.  These Care Teams include your primary Cardiologist (physician) and Advanced Practice Providers (APPs -  Physician Assistants and Nurse Practitioners) who all work together to provide you with the care you need, when you need it.  We recommend signing up for the patient portal called "MyChart".  Sign up information is provided on this After Visit Summary.  MyChart is used to connect with patients for Virtual Visits (Telemedicine).  Patients are able to view lab/test results, encounter notes, upcoming appointments, etc.  Non-urgent messages can be sent to  your provider as well.   To learn more about what you can do with MyChart, go to [email or web address].    Your next appointment:   6 month(s)  Provider:   Elder Negus, MD     Other Instructions Diet & Lifestyle recommendations:  Physical activity recommendation (The Physical Activity Guidelines for Americans. JAMA 2018;Nov 12) At least 150-300 minutes a week of moderate-intensity, or 75-150 minutes a week of vigorous-intensity aerobic physical activity, or an equivalent combination of moderate- and vigorous-intensity aerobic activity. Adults should perform muscle-strengthening activities on 2 or more days a week. Older adults should do multicomponent physical activity that includes balance training as well as aerobic and muscle-strengthening activities. Benefits of increased physical activity include lower risk of mortality including cardiovascular mortality, lower risk of cardiovascular events and associated risk factors (hypertension and diabetes), and lower risk of many cancers (including bladder, breast, colon, endometrium, esophagus, kidney, lung, and stomach). Additional improvments have been seen in cognition, risk of dementia, anxiety and depression, improved bone health, lower risk of falls, and associated injuries.  Dietary recommendation The 2019 ACC/AHA guidelines promote nutrition as a main fixture of cardiovascular wellness, with a recommendation for a varied diet of fruit, vegetables, fish, legumes, and whole grains (Class I), as well as recommendations to reduce sodium, cholesterol, processed meats, and refined sugars (Class IIa recommendation).10 Sodium intake, a topic of some controversy as of late, is recommended to be kept at 1,500 mg/day or less, far below the average daily intake in the [place] of 3,409 mg/day, and notably below that of previous [place] recommendations for 300mg /day.10,11 For those unable to reach 1,500 mg/day,  they recommend at least a reduction of 1000  mg/day.  A Pesco-Mediterranean Diet With Intermittent Fasting: JACC Review Topic of the Week. J Am Coll Cardiol 2020;76:1484-1493 Pesco-Mediterranean diet, it is supplemented with extra-virgin olive oil (EVOO), which is the principle fat source, along with moderate amounts of dairy (particularly yogurt and cheese) and eggs, as well as modest amounts of alcohol consumption (ideally red wine with the evening meal), but few red and processed meats.

## 2023-08-24 NOTE — Progress Notes (Addendum)
Cardiology Office Note:  .   Date:  08/24/2023  ID:  Eduardo [name], DOB 11-Jun-1956, MRN [id number] PCP: Joselyn Arrow, MD  [facility] Providers Cardiologist:  Truett Mainland, MD PCP: Joselyn Arrow, MD  Chief Complaint  Patient presents with   Follow-up   HFrEF      History of Present Illness: .    Eduardo [name] is a 67 y.o. male with hypertension, hyperlipidemia, morbid obesity, type 2 diabetes mellitus, severe multivessel CAD with no good revascularization targets (last cath 2021), HFrEF w/ischemic cardiomyopathy s/p BiVICD placement, PAD s/p Rt SFA stenting, Rt BKA  Patient is doing well.  He stays active with regular walking, biking on [place] etc. without any complaints of chest pain, shortness of breath.  He has right foot phantom pain is improving after recent change in sleeve.  Blood pressure low today, as it usually is, but he denies any lightheadedness or syncope symptoms.  He takes Entresto 24-26 mg twice daily, as well as torsemide 20 mg twice daily.  Vitals:   08/24/23 0919  BP: (!) 82/48  Pulse: 66  SpO2: 97%     ROS:  Review of Systems  Cardiovascular:  Negative for chest pain, dyspnea on exertion, leg swelling, palpitations and syncope.     Studies Reviewed: Marland Kitchen       Labs 2024: Cr 1.4, eGFR 53, LDL 33  TEE 03/2022:  1. Left ventricular ejection fraction, by estimation, is 20 to 25%. The  left ventricle has severely decreased function. The left ventricle  demonstrates global hypokinesis. The left ventricular internal cavity size  was severely dilated. Left ventricular  diastolic function could not be evaluated.   2. Right ventricular systolic function is normal. The right ventricular  size is normal.   3. Left atrial size was severely dilated. No left atrial/left atrial  appendage thrombus was detected.   4. The mitral valve is normal in structure. Mild to moderate mitral valve  regurgitation. No evidence of mitral stenosis.   5. Mild  calcification of the non coronary cusp. The aortic valve is  tricuspid. There is mild calcification of the aortic valve. Aortic valve  regurgitation is trivial.   Conclusion(s)/Recommendation(s): No evidence of vegetation/infective  endocarditis on this transesophageael echocardiogram.   Left Heart Catheterization 10/11/20:  LV: Severely dilated.  Global hypokinesis.  Hand contrast injection hence not fully adequately visualized.  LVEF 15 to 20%.  EDP markedly elevated at 29 mmHg.  No pressure gradient across the aortic valve. Left main: Normal. LAD: Severely diffusely diseased.  Gives origin to large D1 which gives collaterals to the LAD and 2 smaller diagonals, LAD is occluded after the origin of D1, anatomy compared to prior in 2017 reveals progression of diffuse disease.  There are ipsilateral and contralateral collaterals to the LAD. CX: Moderate sized vessel, giving origin to large OM1.  OM1 is occluded in the ostium as was noted previously and has ipsilateral collaterals.  OM1 is diffusely diseased and faintly filled. RCA: Moderate disease in the proximal and mid segment.  At the bifurcation of PDA and PL, there is a high-grade 90% stenosis.  The bifurcation is also involved with at least a 90% stenosis in the PDA and a 60 to 70% stenosis in the PL branch.  There is no target as distally as the PL branch which is large is occluded distally that was also noted in 2017.  There are faint collaterals noted from the left to the RCA.   Impression: Severe  native vessel three-vessel coronary artery disease with no significant targets for revascularization, LAD may be amenable for revascularization but I am not sure that this would help.  Patient is extremely ill with high risk for mortality.  He also has underlying stage III-IV kidney disease and contrast nephropathy needs to be evaluated further in view of contrast load of 70 mL. I try to reach his friend on the contact list, unable to  reach.   Physical Exam:   Physical Exam Vitals and nursing note reviewed.  Constitutional:      General: He is not in acute distress. Neck:     Vascular: No JVD.  Cardiovascular:     Rate and Rhythm: Normal rate and regular rhythm.     Heart sounds: Normal heart sounds. No murmur heard. Pulmonary:     Effort: Pulmonary effort is normal.     Breath sounds: Normal breath sounds. No wheezing or rales.  Musculoskeletal:     Left lower leg: No edema.     Comments: Rt BKA       VISIT DIAGNOSES:   ICD-10-CM   1. Coronary artery disease involving native coronary artery of native heart without angina pectoris  I25.10 ECHOCARDIOGRAM COMPLETE    torsemide (DEMADEX) 20 MG tablet    2. Ischemic cardiomyopathy  I25.5 ECHOCARDIOGRAM COMPLETE    torsemide (DEMADEX) 20 MG tablet    3. Chronic systolic heart failure (HCC)  V40.98 ECHOCARDIOGRAM COMPLETE    torsemide (DEMADEX) 20 MG tablet       ASSESSMENT AND PLAN: .    Eduardo [name] is a 67 y.o. male with hypertension, hyperlipidemia, morbid obesity, type 2 diabetes mellitus, severe multivessel CAD with no good revascularization targets (last cath 2021), HFrEF w/ischemic cardiomyopathy s/p BiVICD placement, PAD s/p Rt SFA stenting, Rt BKA  CAD: Severe multivessel CAD with no good targets for either percutaneous or surgical revascularization due to small caliber and diffuse nature of the disease. Fortunately, patient has no angina or anginal current symptoms at this time. Currently, he is on aspirin and Plavix.  It would be reasonable to admit him to Plavix monotherapy, with no added benefit of DAPT.  Therefore, we will discontinue aspirin today.  HFrEF: Ischemic cardiomyopathy, NYHA symptoms class I-II since BiV ICD placement. EF 15-20% as of 2023.  He is euvolemic without any obvious symptoms of congestive heart failure at this time.  He is on Entresto 24-26 mg twice daily.  His systolic blood pressure usually runs around 80-90.   Since he is tolerating well without any symptoms, I will continue the same dose of Entresto.  However, I will reduce torsemide to 20 mg once daily, and additional dose of torsemide only as needed for leg edema. He already drinks 120 ounces of water daily. Will obtain echocardiogram for updated evaluation of his EF now that he is S/p BiVICD placement.  Meds ordered this encounter  Medications   torsemide (DEMADEX) 20 MG tablet    Sig: Take 1 tablet (20 mg total) by mouth daily. Take additional one tablet by mouth as needed for swelling    Dispense:  130 tablet    Refill:  3     F/u in 6 months  Signed, Elder Negus, MD

## 2023-08-24 NOTE — Telephone Encounter (Signed)
Called patient to let him know that Dr. Rosemary Holms does not want him to take his Aspirin since he is on Plavix. Patient picked up the phone, but before I could tell him what we were calling about  he stated, " I will have to call you back", and hung up. When he returns the call, we will let him know to stop his Aspirin and continue his Plavix.

## 2023-08-24 NOTE — Telephone Encounter (Signed)
Left detailed message per DPR. Advised pt call back if questions.

## 2023-09-22 ENCOUNTER — Ambulatory Visit (HOSPITAL_COMMUNITY): Payer: Medicare Other | Attending: Cardiology

## 2023-09-22 DIAGNOSIS — I255 Ischemic cardiomyopathy: Secondary | ICD-10-CM | POA: Diagnosis not present

## 2023-09-22 DIAGNOSIS — I5022 Chronic systolic (congestive) heart failure: Secondary | ICD-10-CM | POA: Diagnosis not present

## 2023-09-22 DIAGNOSIS — I2510 Atherosclerotic heart disease of native coronary artery without angina pectoris: Secondary | ICD-10-CM

## 2023-09-22 LAB — ECHOCARDIOGRAM COMPLETE
Area-P 1/2: 5.75 cm2
MV M vel: 3.26 m/s
MV Peak grad: 42.5 mm[Hg]
Radius: 0.6 cm
S' Lateral: 6.4 cm

## 2023-09-22 IMAGING — MR MR ANKLE*R* W/O CM
4 of 5 series · 17 of 40 positions shown · non-contrast
Comparison: Radiograph 03/08/2022

CLINICAL DATA: Lateral foot ulceration.  Ankle pain.

EXAM:
MRI OF THE RIGHT ANKLE WITHOUT CONTRAST
TECHNIQUE: Multiplanar, multisequence MR imaging of the ankle was performed. No
intravenous contrast was administered.

[Series 6: T1 · sagittal · 4.0mm · 0.39mm/px · 3 of 24 slices shown]
[im 5/24]
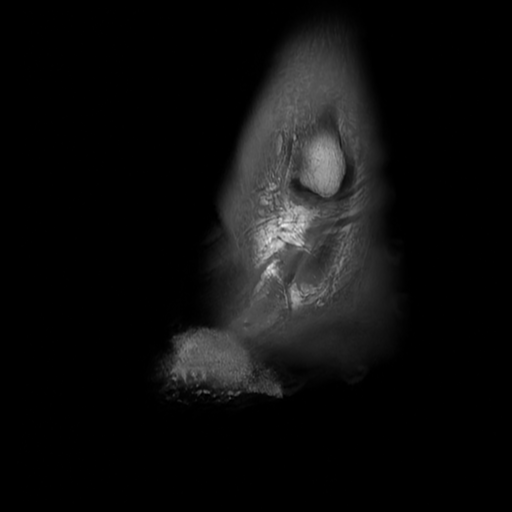
[im 14/24]
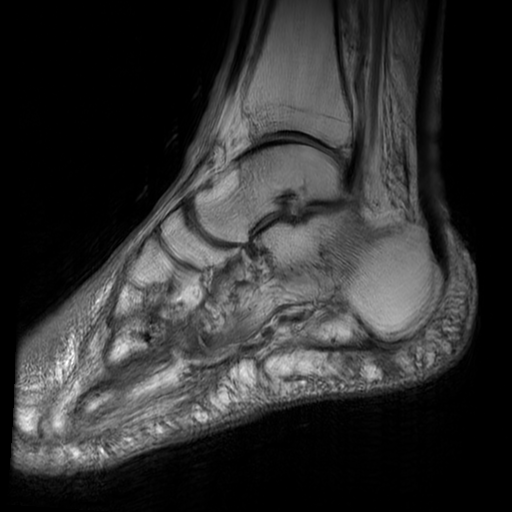
[im 24/24]
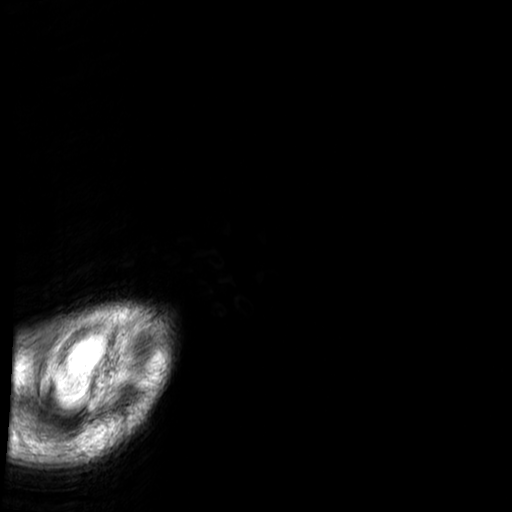

[Series 9: T2 fat-sat · coronal · 3.0mm · 0.35mm/px · 8 of 50 slices shown (1 of 2)]
[im 1/50]
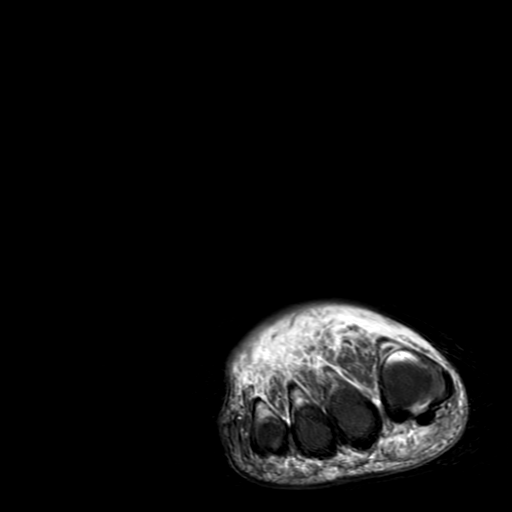
[im 5/50]
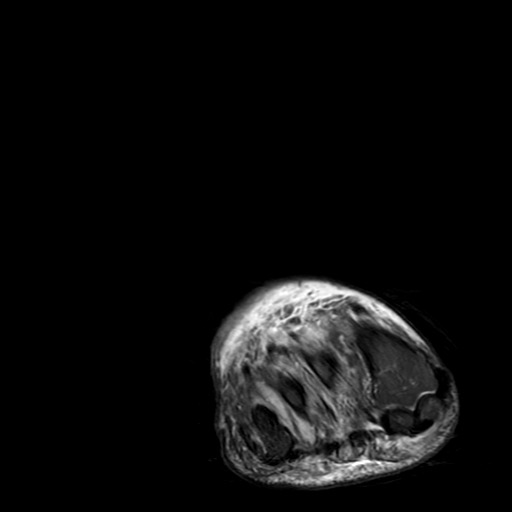
[im 10/50]
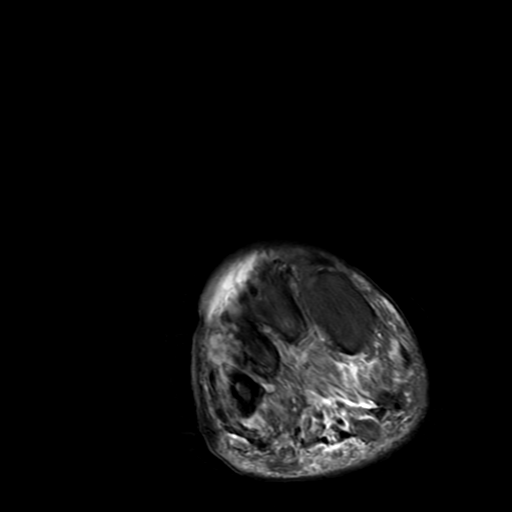
[im 15/50]
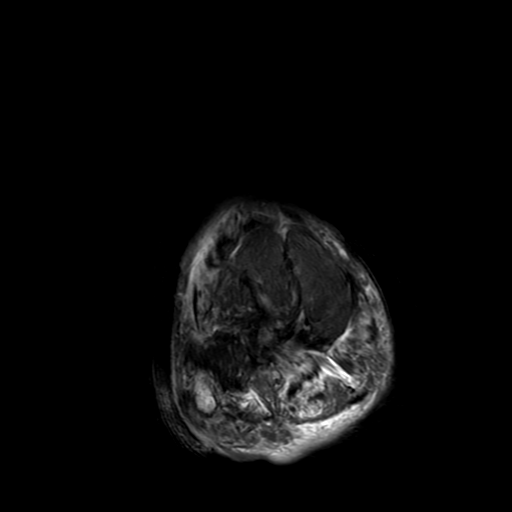
[im 20/50]
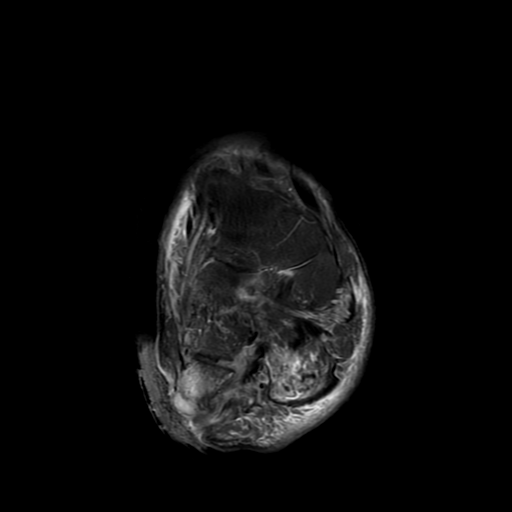
[im 25/50]
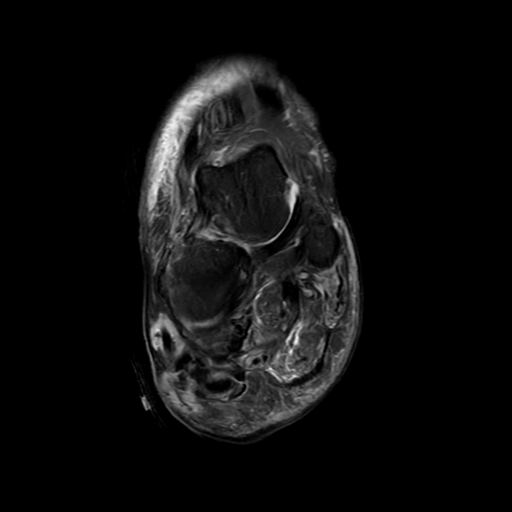
[im 30/50]
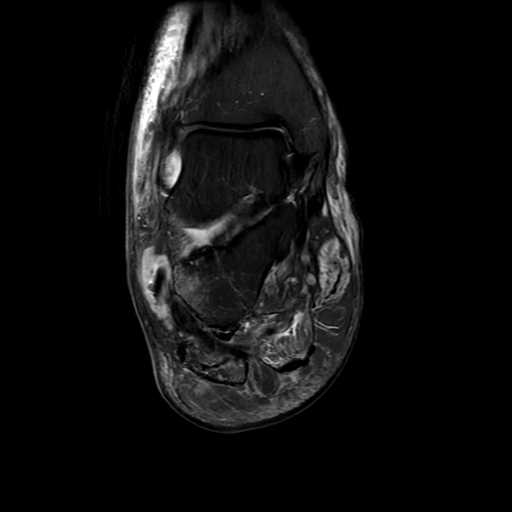
[im 45/50]
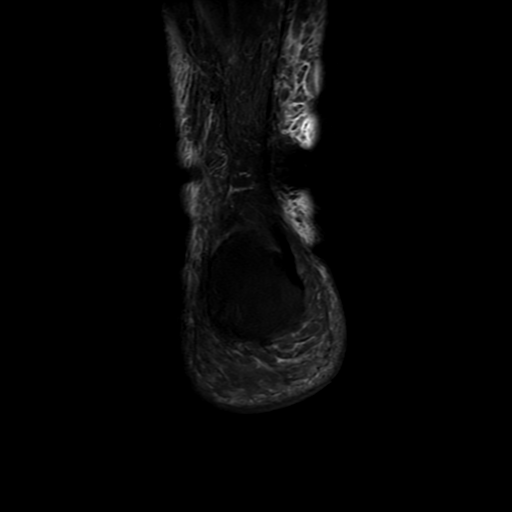

[Series 10: T2 fat-sat · axial · 3.0mm · 0.37mm/px · z∈[-3,+114]mm · 3 of 40 slices shown (2 of 2)]
[im 5/40]
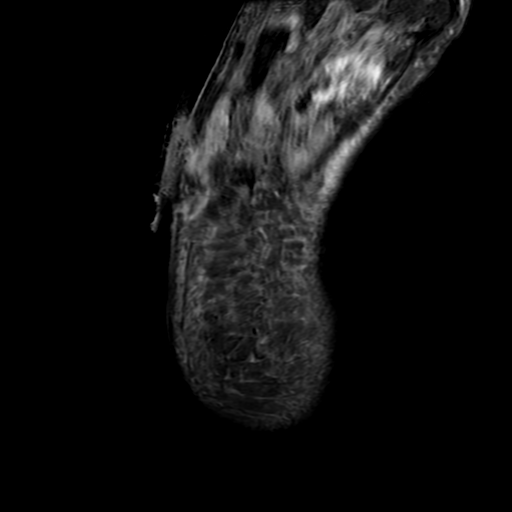
[im 20/40]
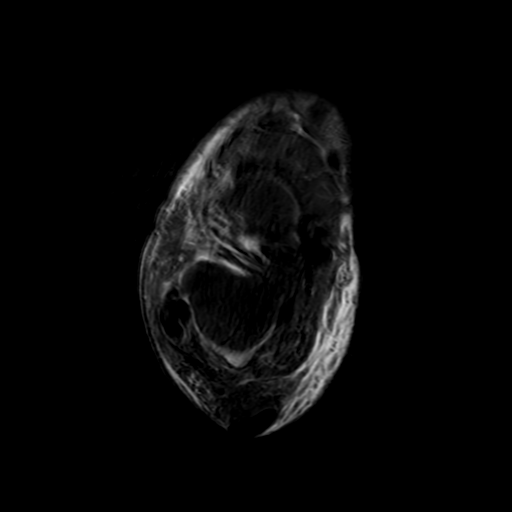
[im 35/40]
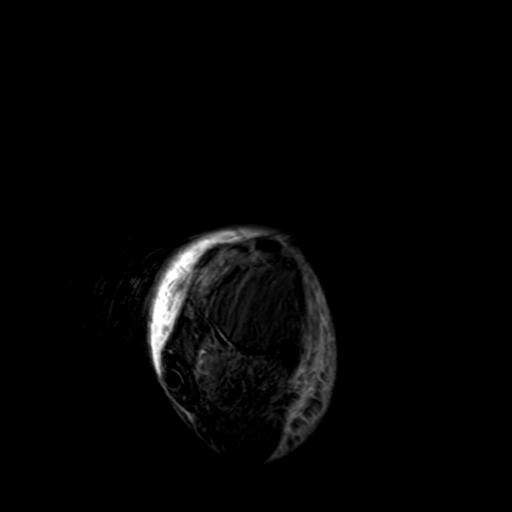

[Series 11: T1 fat-sat · axial · non-contrast · 3.0mm · 0.37mm/px · z∈[-3,+114]mm · 3 of 40 slices shown]
[im 5/40]
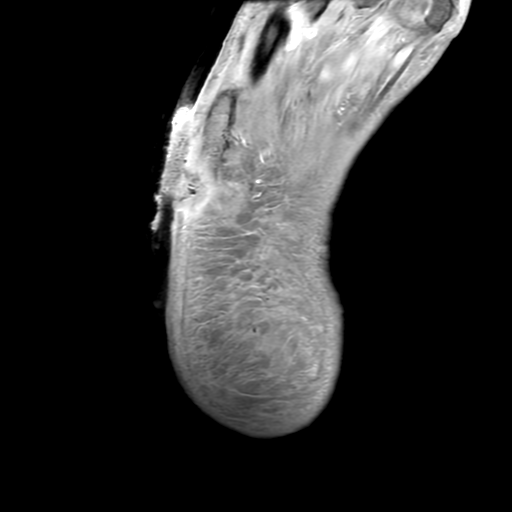
[im 20/40]
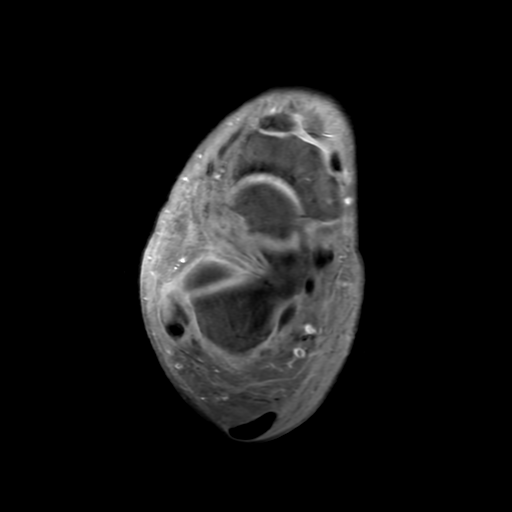
[im 35/40]
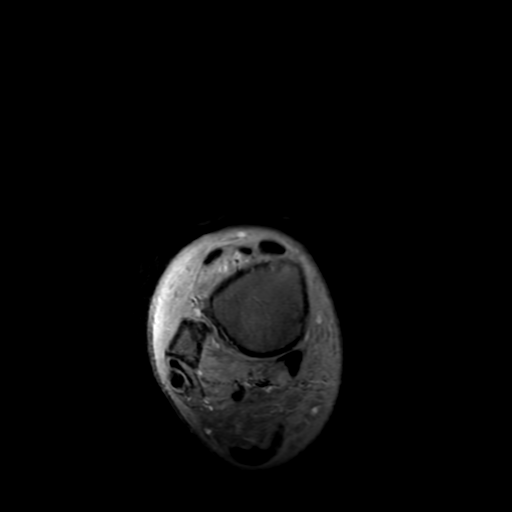

[17 of 40 positions shown; findings below may reference images not displayed]

FINDINGS: Despite efforts by the technologist and patient, motion artifact is
present on today's exam and could not be eliminated. This reduces
exam sensitivity and specificity.

TENDONS

Peroneal: Partially torn peroneus brevis with substantial distal
peroneus brevis tenosynovitis and indistinctness of the attachment
of the peroneus brevis to the severely edematous remnant of the
fifth metatarsal base. Moderate peroneus longus tendinopathy and
mild peritendinitis.

Posteromedial: Mild distal tibialis posterior tenosynovitis.
Accessory navicular noted.

Anterior: Unremarkable

Achilles: Unremarkable

Plantar Fascia: Accentuated signal in the medial band of the plantar
fascia proximally potentially from plantar fasciitis, image 11
series 7.

LIGAMENTS

Lateral: Unremarkable

Medial: Deltoid ligament intact. Thickened spring ligament, image 26
series 9.

CARTILAGE

Ankle Joint: Unremarkable

Subtalar Joints/Sinus Tarsi: Small posterior subtalar joint
effusion.

Bones: Diffuse abnormal edema in the remaining base of the fifth
metatarsal compatible with active osteomyelitis. Overlying
wound/ulceration noted, image 31 series 9.

Patchy marrow edema in the anterior calcaneus, cuboid,, and lateral
and middle cuneiform noted along with the bases of the third and
fourth metatarsals, more likely to be reactive than osteomyelitis
although early osteomyelitis is difficult to totally exclude. Small
effusion of the calcaneocuboid articulation.

Other: Substantial subcutaneous edema anterolaterally and
posteromedially in the ankle. Substantial subcutaneous edema along
the dorsal and plantar foot. Low-grade edema tracks in the plantar
foot musculature.
IMPRESSION: 1. Active osteomyelitis in the remaining base of the fifth
metatarsal, with overlying ulceration noted. There is partial
tearing of the peroneus brevis tendon in the vicinity of the lateral
malleolus and substantial distal peroneus brevis tenosynovitis and
distal tendinopathy with questionable integrity of its attachment to
the base of the fifth metatarsal. The tenosynovitis could be
infected based on the osteomyelitis and large serration.
2. Moderate peroneus longus tendinopathy and mild peroneus longus
tenosynovitis.
3. Mild distal tibialis posterior tenosynovitis.
4. Patchy low-grade marrow edema in the cuboid, anterior calcaneus,
middle and lateral cuneiforms, and bases of the third and fourth
metatarsals, more likely reactive than representing osteomyelitis
although early osteomyelitis in one or more of these locations is
difficult to totally exclude.
5. Small effusion of the calcaneocuboid articulation.
6. Subcutaneous edema along the ankle and foot, cellulitis is not
excluded. There is also low-grade edema tracking along the plantar
foot musculature which could be neurogenic or related to myositis.
7. Suspected mild plantar fasciitis.
8. Small posterior subtalar joint effusion.
9. Thickened spring ligament.

## 2023-09-22 MED ORDER — PERFLUTREN LIPID MICROSPHERE
1.0000 mL | INTRAVENOUS | Status: AC | PRN
  Administered 2023-09-22: 2 mL via INTRAVENOUS

## 2023-09-24 NOTE — Telephone Encounter (Signed)
[facility] do not document in EPIC and scan to Media tab using the Consultation Report/Note as the type of document. Filter to that type for them.

## 2023-09-27 NOTE — Telephone Encounter (Signed)
Notes from [facility] Abstracted.

## 2023-10-01 ENCOUNTER — Other Ambulatory Visit: Payer: Self-pay | Admitting: Internal Medicine

## 2023-10-06 DIAGNOSIS — E1165 Type 2 diabetes mellitus with hyperglycemia: Secondary | ICD-10-CM | POA: Diagnosis not present

## 2023-10-08 ENCOUNTER — Other Ambulatory Visit: Payer: Self-pay | Admitting: Cardiology

## 2023-10-19 DIAGNOSIS — E1165 Type 2 diabetes mellitus with hyperglycemia: Secondary | ICD-10-CM | POA: Diagnosis not present

## 2023-10-19 DIAGNOSIS — E7800 Pure hypercholesterolemia, unspecified: Secondary | ICD-10-CM | POA: Diagnosis not present

## 2023-10-19 LAB — LIPID PANEL
Cholesterol: 83 (ref 0–200)
HDL: 37 (ref 35–70)
LDL Cholesterol: 29
Triglycerides: 82 (ref 40–160)

## 2023-10-19 LAB — BASIC METABOLIC PANEL
CO2: 23 — AB (ref 13–22)
Chloride: 107 (ref 99–108)
Creatinine: 1.5 — AB (ref 0.6–1.3)
Glucose: 138
Potassium: 4.8 meq/L (ref 3.5–5.1)
Sodium: 144 (ref 137–147)

## 2023-10-19 LAB — COMPREHENSIVE METABOLIC PANEL: eGFR: 49

## 2023-10-19 LAB — HEMOGLOBIN A1C: Hemoglobin A1C: 6.6

## 2023-10-19 LAB — HEPATIC FUNCTION PANEL
ALT: 22 U/L (ref 10–40)
AST: 22 (ref 14–40)
Alkaline Phosphatase: 85 (ref 25–125)

## 2023-10-21 ENCOUNTER — Ambulatory Visit: Payer: Medicare Other

## 2023-10-21 DIAGNOSIS — I447 Left bundle-branch block, unspecified: Secondary | ICD-10-CM | POA: Diagnosis not present

## 2023-10-21 LAB — CUP PACEART REMOTE DEVICE CHECK
Battery Remaining Longevity: 74 mo
Battery Remaining Percentage: 81 %
Battery Voltage: 2.98 V
Brady Statistic AP VP Percent: 6.9 %
Brady Statistic AP VS Percent: 1 %
Brady Statistic AS VP Percent: 89 %
Brady Statistic AS VS Percent: 1.3 %
Brady Statistic RA Percent Paced: 4.3 %
Date Time Interrogation Session: 20241212010024
HighPow Impedance: 54 Ohm
Implantable Lead Connection Status: 753985
Implantable Lead Connection Status: 753985
Implantable Lead Connection Status: 753985
Implantable Lead Implant Date: 20231213
Implantable Lead Implant Date: 20231213
Implantable Lead Implant Date: 20231213
Implantable Lead Location: 753858
Implantable Lead Location: 753859
Implantable Lead Location: 753860
Implantable Pulse Generator Implant Date: 20231213
Lead Channel Impedance Value: 330 Ohm
Lead Channel Impedance Value: 350 Ohm
Lead Channel Impedance Value: 690 Ohm
Lead Channel Pacing Threshold Amplitude: 0.75 V
Lead Channel Pacing Threshold Amplitude: 0.75 V
Lead Channel Pacing Threshold Amplitude: 1 V
Lead Channel Pacing Threshold Pulse Width: 0.5 ms
Lead Channel Pacing Threshold Pulse Width: 0.5 ms
Lead Channel Pacing Threshold Pulse Width: 0.5 ms
Lead Channel Sensing Intrinsic Amplitude: 11.9 mV
Lead Channel Sensing Intrinsic Amplitude: 2 mV
Lead Channel Setting Pacing Amplitude: 2 V
Lead Channel Setting Pacing Amplitude: 2 V
Lead Channel Setting Pacing Amplitude: 2 V
Lead Channel Setting Pacing Pulse Width: 0.5 ms
Lead Channel Setting Pacing Pulse Width: 0.5 ms
Lead Channel Setting Sensing Sensitivity: 0.5 mV
Pulse Gen Serial Number: 211000872

## 2023-10-26 DIAGNOSIS — N189 Chronic kidney disease, unspecified: Secondary | ICD-10-CM | POA: Diagnosis not present

## 2023-10-26 DIAGNOSIS — I10 Essential (primary) hypertension: Secondary | ICD-10-CM | POA: Diagnosis not present

## 2023-10-26 DIAGNOSIS — E7800 Pure hypercholesterolemia, unspecified: Secondary | ICD-10-CM | POA: Diagnosis not present

## 2023-10-26 DIAGNOSIS — I739 Peripheral vascular disease, unspecified: Secondary | ICD-10-CM | POA: Diagnosis not present

## 2023-10-26 DIAGNOSIS — E1165 Type 2 diabetes mellitus with hyperglycemia: Secondary | ICD-10-CM | POA: Diagnosis not present

## 2023-10-26 DIAGNOSIS — R809 Proteinuria, unspecified: Secondary | ICD-10-CM | POA: Diagnosis not present

## 2023-10-27 ENCOUNTER — Encounter: Payer: Self-pay | Admitting: *Deleted

## 2023-11-13 ENCOUNTER — Other Ambulatory Visit: Payer: Self-pay

## 2023-11-13 ENCOUNTER — Encounter (HOSPITAL_COMMUNITY): Payer: Self-pay | Admitting: Emergency Medicine

## 2023-11-13 ENCOUNTER — Inpatient Hospital Stay (HOSPITAL_COMMUNITY)
Admission: EM | Admit: 2023-11-13 | Discharge: 2023-11-18 | DRG: 330 | Disposition: A | Discharge status: Home / Self Care | Payer: Medicare Other | Attending: Internal Medicine | Admitting: Internal Medicine

## 2023-11-13 DIAGNOSIS — D1803 Hemangioma of intra-abdominal structures: Secondary | ICD-10-CM | POA: Diagnosis present

## 2023-11-13 DIAGNOSIS — Z87891 Personal history of nicotine dependence: Secondary | ICD-10-CM

## 2023-11-13 DIAGNOSIS — I447 Left bundle-branch block, unspecified: Secondary | ICD-10-CM | POA: Diagnosis not present

## 2023-11-13 DIAGNOSIS — N1831 Chronic kidney disease, stage 3a: Secondary | ICD-10-CM | POA: Diagnosis not present

## 2023-11-13 DIAGNOSIS — Z9841 Cataract extraction status, right eye: Secondary | ICD-10-CM

## 2023-11-13 DIAGNOSIS — E11319 Type 2 diabetes mellitus with unspecified diabetic retinopathy without macular edema: Secondary | ICD-10-CM | POA: Diagnosis not present

## 2023-11-13 DIAGNOSIS — E781 Pure hyperglyceridemia: Secondary | ICD-10-CM | POA: Diagnosis present

## 2023-11-13 DIAGNOSIS — Z91040 Latex allergy status: Secondary | ICD-10-CM

## 2023-11-13 DIAGNOSIS — Z833 Family history of diabetes mellitus: Secondary | ICD-10-CM

## 2023-11-13 DIAGNOSIS — E1151 Type 2 diabetes mellitus with diabetic peripheral angiopathy without gangrene: Secondary | ICD-10-CM | POA: Diagnosis not present

## 2023-11-13 DIAGNOSIS — K565 Intestinal adhesions [bands], unspecified as to partial versus complete obstruction: Principal | ICD-10-CM | POA: Diagnosis present

## 2023-11-13 DIAGNOSIS — E669 Obesity, unspecified: Secondary | ICD-10-CM | POA: Diagnosis present

## 2023-11-13 DIAGNOSIS — R739 Hyperglycemia, unspecified: Secondary | ICD-10-CM | POA: Diagnosis not present

## 2023-11-13 DIAGNOSIS — I340 Nonrheumatic mitral (valve) insufficiency: Secondary | ICD-10-CM | POA: Diagnosis present

## 2023-11-13 DIAGNOSIS — R110 Nausea: Secondary | ICD-10-CM | POA: Diagnosis not present

## 2023-11-13 DIAGNOSIS — K559 Vascular disorder of intestine, unspecified: Principal | ICD-10-CM

## 2023-11-13 DIAGNOSIS — Z91048 Other nonmedicinal substance allergy status: Secondary | ICD-10-CM

## 2023-11-13 DIAGNOSIS — R0989 Other specified symptoms and signs involving the circulatory and respiratory systems: Secondary | ICD-10-CM | POA: Diagnosis not present

## 2023-11-13 DIAGNOSIS — Z91013 Allergy to seafood: Secondary | ICD-10-CM

## 2023-11-13 DIAGNOSIS — Z7985 Long-term (current) use of injectable non-insulin antidiabetic drugs: Secondary | ICD-10-CM

## 2023-11-13 DIAGNOSIS — R52 Pain, unspecified: Secondary | ICD-10-CM | POA: Diagnosis not present

## 2023-11-13 DIAGNOSIS — Z822 Family history of deafness and hearing loss: Secondary | ICD-10-CM

## 2023-11-13 DIAGNOSIS — R918 Other nonspecific abnormal finding of lung field: Secondary | ICD-10-CM | POA: Diagnosis not present

## 2023-11-13 DIAGNOSIS — R0600 Dyspnea, unspecified: Secondary | ICD-10-CM | POA: Diagnosis not present

## 2023-11-13 DIAGNOSIS — E1122 Type 2 diabetes mellitus with diabetic chronic kidney disease: Secondary | ICD-10-CM | POA: Diagnosis present

## 2023-11-13 DIAGNOSIS — Z794 Long term (current) use of insulin: Secondary | ICD-10-CM

## 2023-11-13 DIAGNOSIS — Z8709 Personal history of other diseases of the respiratory system: Secondary | ICD-10-CM

## 2023-11-13 DIAGNOSIS — I10 Essential (primary) hypertension: Secondary | ICD-10-CM | POA: Diagnosis not present

## 2023-11-13 DIAGNOSIS — I2510 Atherosclerotic heart disease of native coronary artery without angina pectoris: Secondary | ICD-10-CM | POA: Diagnosis not present

## 2023-11-13 DIAGNOSIS — K5730 Diverticulosis of large intestine without perforation or abscess without bleeding: Secondary | ICD-10-CM | POA: Diagnosis not present

## 2023-11-13 DIAGNOSIS — Z87448 Personal history of other diseases of urinary system: Secondary | ICD-10-CM

## 2023-11-13 DIAGNOSIS — Z8719 Personal history of other diseases of the digestive system: Secondary | ICD-10-CM

## 2023-11-13 DIAGNOSIS — I255 Ischemic cardiomyopathy: Secondary | ICD-10-CM | POA: Diagnosis present

## 2023-11-13 DIAGNOSIS — E1140 Type 2 diabetes mellitus with diabetic neuropathy, unspecified: Secondary | ICD-10-CM | POA: Diagnosis present

## 2023-11-13 DIAGNOSIS — Z8701 Personal history of pneumonia (recurrent): Secondary | ICD-10-CM

## 2023-11-13 DIAGNOSIS — K769 Liver disease, unspecified: Secondary | ICD-10-CM

## 2023-11-13 DIAGNOSIS — Z6833 Body mass index (BMI) 33.0-33.9, adult: Secondary | ICD-10-CM

## 2023-11-13 DIAGNOSIS — E1165 Type 2 diabetes mellitus with hyperglycemia: Secondary | ICD-10-CM | POA: Diagnosis not present

## 2023-11-13 DIAGNOSIS — Z8679 Personal history of other diseases of the circulatory system: Secondary | ICD-10-CM

## 2023-11-13 DIAGNOSIS — I5022 Chronic systolic (congestive) heart failure: Secondary | ICD-10-CM | POA: Diagnosis present

## 2023-11-13 DIAGNOSIS — I252 Old myocardial infarction: Secondary | ICD-10-CM

## 2023-11-13 DIAGNOSIS — Z820 Family history of epilepsy and other diseases of the nervous system: Secondary | ICD-10-CM

## 2023-11-13 DIAGNOSIS — R011 Cardiac murmur, unspecified: Secondary | ICD-10-CM | POA: Diagnosis present

## 2023-11-13 DIAGNOSIS — Z9889 Other specified postprocedural states: Secondary | ICD-10-CM

## 2023-11-13 DIAGNOSIS — Z83438 Family history of other disorder of lipoprotein metabolism and other lipidemia: Secondary | ICD-10-CM

## 2023-11-13 DIAGNOSIS — K567 Ileus, unspecified: Secondary | ICD-10-CM | POA: Diagnosis not present

## 2023-11-13 DIAGNOSIS — Z89511 Acquired absence of right leg below knee: Secondary | ICD-10-CM | POA: Diagnosis not present

## 2023-11-13 DIAGNOSIS — Z888 Allergy status to other drugs, medicaments and biological substances status: Secondary | ICD-10-CM

## 2023-11-13 DIAGNOSIS — Z8619 Personal history of other infectious and parasitic diseases: Secondary | ICD-10-CM

## 2023-11-13 DIAGNOSIS — Z7902 Long term (current) use of antithrombotics/antiplatelets: Secondary | ICD-10-CM

## 2023-11-13 DIAGNOSIS — Z8249 Family history of ischemic heart disease and other diseases of the circulatory system: Secondary | ICD-10-CM

## 2023-11-13 DIAGNOSIS — Z860101 Personal history of adenomatous and serrated colon polyps: Secondary | ICD-10-CM

## 2023-11-13 DIAGNOSIS — I130 Hypertensive heart and chronic kidney disease with heart failure and stage 1 through stage 4 chronic kidney disease, or unspecified chronic kidney disease: Secondary | ICD-10-CM | POA: Diagnosis not present

## 2023-11-13 DIAGNOSIS — K56609 Unspecified intestinal obstruction, unspecified as to partial versus complete obstruction: Secondary | ICD-10-CM | POA: Diagnosis not present

## 2023-11-13 DIAGNOSIS — Z95810 Presence of automatic (implantable) cardiac defibrillator: Secondary | ICD-10-CM

## 2023-11-13 DIAGNOSIS — Z9842 Cataract extraction status, left eye: Secondary | ICD-10-CM

## 2023-11-13 DIAGNOSIS — Z79899 Other long term (current) drug therapy: Secondary | ICD-10-CM

## 2023-11-13 LAB — COMPREHENSIVE METABOLIC PANEL
ALT: 30 U/L (ref 0–44)
AST: 34 U/L (ref 15–41)
Albumin: 4 g/dL (ref 3.5–5.0)
Alkaline Phosphatase: 77 U/L (ref 38–126)
Anion gap: 13 (ref 5–15)
BUN: 37 mg/dL — ABNORMAL HIGH (ref 8–23)
CO2: 25 mmol/L (ref 22–32)
Calcium: 9.5 mg/dL (ref 8.9–10.3)
Chloride: 100 mmol/L (ref 98–111)
Creatinine, Ser: 1.62 mg/dL — ABNORMAL HIGH (ref 0.61–1.24)
GFR, Estimated: 46 mL/min — ABNORMAL LOW (ref >60)
Glucose, Bld: 253 mg/dL — ABNORMAL HIGH (ref 70–99)
Potassium: 4.2 mmol/L (ref 3.5–5.1)
Sodium: 138 mmol/L (ref 135–145)
Total Bilirubin: 1.5 mg/dL — ABNORMAL HIGH (ref 0.0–1.2)
Total Protein: 7.3 g/dL (ref 6.5–8.1)

## 2023-11-13 LAB — I-STAT CHEM 8, ED
BUN: 37 mg/dL — ABNORMAL HIGH (ref 8–23)
Calcium, Ion: 1.14 mmol/L — ABNORMAL LOW (ref 1.15–1.40)
Chloride: 101 mmol/L (ref 98–111)
Creatinine, Ser: 1.5 mg/dL — ABNORMAL HIGH (ref 0.61–1.24)
Glucose, Bld: 243 mg/dL — ABNORMAL HIGH (ref 70–99)
HCT: 47 % (ref 39.0–52.0)
Hemoglobin: 16 g/dL (ref 13.0–17.0)
Potassium: 4.2 mmol/L (ref 3.5–5.1)
Sodium: 139 mmol/L (ref 135–145)
TCO2: 25 mmol/L (ref 22–32)

## 2023-11-13 LAB — CBC
HCT: 46.5 % (ref 39.0–52.0)
Hemoglobin: 15 g/dL (ref 13.0–17.0)
MCH: 29.8 pg (ref 26.0–34.0)
MCHC: 32.3 g/dL (ref 30.0–36.0)
MCV: 92.3 fL (ref 80.0–100.0)
Platelets: 160 10*3/uL (ref 150–400)
RBC: 5.04 MIL/uL (ref 4.22–5.81)
RDW: 13.2 % (ref 11.5–15.5)
WBC: 10.7 10*3/uL — ABNORMAL HIGH (ref 4.0–10.5)
nRBC: 0 % (ref 0.0–0.2)

## 2023-11-13 LAB — CBG MONITORING, ED: Glucose-Capillary: 253 mg/dL — ABNORMAL HIGH (ref 70–99)

## 2023-11-13 LAB — LIPASE, BLOOD: Lipase: 27 U/L (ref 11–51)

## 2023-11-13 LAB — TROPONIN I (HIGH SENSITIVITY): Troponin I (High Sensitivity): 25 ng/L — ABNORMAL HIGH

## 2023-11-13 MED ORDER — SODIUM CHLORIDE 0.9 % IV BOLUS
1000.0000 mL | Freq: Once | INTRAVENOUS | Status: AC
  Administered 2023-11-14: 1000 mL via INTRAVENOUS

## 2023-11-13 MED ORDER — ONDANSETRON HCL 4 MG/2ML IJ SOLN: 4.0000 mg | Freq: Once | INTRAMUSCULAR | Status: DC

## 2023-11-13 MED ORDER — FAMOTIDINE IN NACL 20-0.9 MG/50ML-% IV SOLN
20.0000 mg | Freq: Once | INTRAVENOUS | Status: AC
  Administered 2023-11-14: 20 mg via INTRAVENOUS
  Filled 2023-11-13: qty 50

## 2023-11-13 NOTE — ED Provider Notes (Signed)
 [place] [facility] DEPARTMENT AT [facility] Provider Note  CSN: [id number] Arrival date & time: 11/13/23 2216  Chief Complaint(s) Bloated and Hyperglycemia  HPI CHANZE TEAGLE is a 68 y.o. male with past medical history as below, significant for CKD stage III, hypertension, PPM, HFrEF, diabetic neuropathy, osteomyelitis of RLE status post BKA, PAD who presents to the ED with complaint of nausea, vomiting, abdominal discomfort  Feeling well since yesterday evening, nausea vomiting, periumbilical discomfort, gas pain.  No fevers or chills, no change in bowel or bladder function.  No chest pain or dyspnea.  No sick contacts recent travel.  No medication prior to arrival.  Past Medical History Past Medical History:  Diagnosis Date   CHF (congestive heart failure) (HCC)    Chronic kidney disease    stage 3   Colon polyp    Coronary artery disease    Diabetes mellitus 1987   under care of Dr. Tommas.  On insulin  since 96 (off and on)   Diabetic retinopathy    Dupuytren contracture    R hand, s/p injection (Dr. Lorretta)   Essential hypertension, benign    Essential hypertension, benign 02/06/2019   Frequency of urination and polyuria    Hypertension    Myocardial infarction [facility])    denies   Neuromuscular disorder (HCC)    Diabetic neuropathy   Osteomyelitis (HCC)    right foot   Other testicular hypofunction    Peripheral arterial disease (HCC) 10/28/2012   Peritoneal abscess (HCC) 6/08   and buttock.   Pneumonia    Polydipsia    Proteinuria    Pure hyperglyceridemia    Subacute osteomyelitis, right ankle and foot (HCC)    Wears glasses    Patient Active Problem List   Diagnosis Date Noted   ICD (implantable cardioverter-defibrillator) in place 02/04/2023   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   Chronic post-operative pain 09/18/2022   Abnormality of gait 09/18/2022   LBBB (left bundle branch block) 08/19/2022   Chronic systolic  heart failure (HCC) 89/88/7976   Chronic right-sided congestive heart failure (HCC) 05/18/2022   Ejection fraction < 50% 04/17/2022   Insomnia    Constipation    S/P BKA (below knee amputation), right (HCC) 03/21/2022   Ischemic cardiomyopathy    AKI (acute kidney injury) (HCC) 03/13/2022   Streptococcal bacteremia 03/13/2022   Hypophosphatemia 03/13/2022   Septic shock (HCC) 03/08/2022   Acute systolic heart failure (HCC) 12/28/2020   Acute pulmonary edema (HCC) 10/11/2020   Non-ST elevation (NSTEMI) myocardial infarction (HCC) 10/11/2020   Acute respiratory distress 10/11/2020   Acute on chronic combined systolic and diastolic CHF (congestive heart failure) (HCC) 10/11/2020   Cardiogenic shock (HCC)    Partial nontraumatic amputation of foot, right (HCC) 06/21/2019   Osteomyelitis of right foot (HCC)    Essential hypertension, benign 02/06/2019   Hypercholesteremia 02/06/2019   Stage 3b chronic kidney disease (CKD) (HCC) 10/06/2017   Proteinuria 10/06/2017   Controlled type 2 diabetes mellitus with hyperglycemia, with long-term current use of insulin  (HCC) 10/06/2017   Coronary artery disease involving native coronary artery of native heart without angina pectoris 05/23/2017   Abnormal nuclear stress test 09/06/2016   PAD (peripheral artery disease) (HCC) 10/28/2012   Morbid obesity with BMI of 40.0-44.9, adult (HCC) 10/13/2012   Diabetes mellitus with ophthalmic complication (HCC) 10/13/2012   Hypertension associated with diabetes (HCC) 10/13/2012   Atherosclerosis of native artery of extremity with intermittent claudication (HCC) 04/05/2012  Adenomatous colon polyp 09/28/2011   Diabetes mellitus (HCC) 09/28/2011   Pure hyperglyceridemia 09/28/2011   Erectile dysfunction 09/28/2011   Diabetic retinopathy (HCC) 09/28/2011   Home Medication(s) Prior to Admission medications   Medication Sig Start Date End Date Taking? Authorizing Provider  acetaminophen  (TYLENOL ) 325 MG  tablet Take 1-2 tablets (325-650 mg total) by mouth every 4 (four) hours as needed for mild pain. 03/31/22  Yes Setzer, Sandra J, PA-C  Bioflavonoid Products (ESTER C PO) Take 1,000 mg by mouth daily.   Yes [provider]  Cholecalciferol  (VITAMIN D ) 2000 units CAPS Take 2,000 Units by mouth daily.   Yes [provider]  clopidogrel  (PLAVIX ) 75 MG tablet TAKE 1 TABLET BY MOUTH DAILY 01/29/23  Yes Custovic, Sabina, DO  ezetimibe  (ZETIA ) 10 MG tablet TAKE 1 TABLET BY MOUTH DAILY 07/08/23  Yes Patwardhan, Manish J, MD  gemfibrozil  (LOPID ) 600 MG tablet Take 600 mg by mouth 2 (two) times daily before a meal.   Yes [provider]  HUMALOG KWIKPEN 100 UNIT/ML KiwkPen Inject 3-10 Units into the skin 3 (three) times daily. Inject 01-1009 units into the skin three times a day, per sliding scale- based on BGL >100 08/02/16  Yes [provider]  Anselm Oil 300 MG CAPS Take 300 mg by mouth daily.   Yes [provider]  melatonin 10 MG TABS Take 10 mg by mouth at bedtime. 03/31/22  Yes Setzer, Sandra J, PA-C  MOUNJARO 10 MG/0.5ML Pen Inject 10 mg into the skin once a week. 02/03/23  Yes [provider]  Multiple Vitamin (MULTIVITAMIN WITH MINERALS) TABS tablet Take 1 tablet by mouth daily.   Yes [provider]  nitroGLYCERIN  (NITROSTAT ) 0.4 MG SL tablet Place 1 tablet (0.4 mg total) under the tongue every 5 (five) minutes as needed for chest pain. 05/08/21  Yes Cantwell, Celeste C, PA-C  Probiotic Product (PROBIOTIC-10 PO) Take 1 capsule by mouth daily.   Yes [provider]  rosuvastatin  (CRESTOR ) 20 MG tablet Take 1 tablet (20 mg total) by mouth daily. 07/22/20  Yes Ladona Heinz, MD  sacubitril -valsartan  (ENTRESTO ) 24-26 MG TAKE 1 TABLET BY MOUTH TWICE  DAILY 10/11/23  Yes Patwardhan, Manish J, MD  sodium chloride  (OCEAN) 0.65 % SOLN nasal spray Place 1 spray into both nostrils as needed for congestion.   Yes [provider]  torsemide   (DEMADEX ) 20 MG tablet Take 1 tablet (20 mg total) by mouth daily. Take additional one tablet by mouth as needed for swelling 08/24/23  Yes Patwardhan, Newman PARAS, MD                                                                                                                                    Past Surgical History Past Surgical History:  Procedure Laterality Date   ABDOMINAL AORTAGRAM N/A 04/18/2012   Procedure: ABDOMINAL EZELLA;  Surgeon: Lonni GORMAN Blade, MD;  Location[facility] CATH  LAB;  Service: Cardiovascular;  Laterality: N/A;   AMPUTATION Right 05/19/2019   Procedure: RIGHT FOOT 5TH RAY AMPUTATION;  Surgeon: Harden Jerona GAILS, MD;  Location: [facility] OR;  Service: Orthopedics;  Laterality: Right;   AMPUTATION Right 03/11/2022   Procedure: RIGHT LEG DEBRIDEMENT VS. BELOW KNEE AMPUTATION;  Surgeon: Harden Jerona GAILS, MD;  Location: [facility] OR;  Service: Orthopedics;  Laterality: Right;   BIV ICD INSERTION CRT-D N/A 10/21/2022   Procedure: BIV ICD INSERTION CRT-D;  Surgeon: Waddell Danelle ORN, MD;  Location: [facility] INVASIVE CV LAB;  Service: Cardiovascular;  Laterality: N/A;   CARDIAC CATHETERIZATION N/A 09/08/2016   Procedure: Left Heart Cath and Coronary Angiography;  Surgeon: Gordy Bergamo, MD;  Location: [facility] INVASIVE CV LAB;  Service: Cardiovascular;  Laterality: N/A;   CATARACT EXTRACTION, BILATERAL  09/2017, 10/2017   Dr. Cleatus   COLONOSCOPY W/ BIOPSIES AND POLYPECTOMY     CORONARY/GRAFT ACUTE MI REVASCULARIZATION N/A 10/11/2020   Procedure: Coronary/Graft Acute MI Revascularization;  Surgeon: Bergamo Gordy, MD;  Location: [facility] INVASIVE CV LAB;  Service: Cardiovascular;  Laterality: N/A;   I & D EXTREMITY Right 03/11/2022   Procedure: BELOW KNEE AMPUTATION;  Surgeon: Harden Jerona GAILS, MD;  Location: [facility] OR;  Service: Orthopedics;  Laterality: Right;   INSERT / REPLACE / REMOVE PACEMAKER     LEFT HEART CATH N/A 12/31/2020   Procedure: Left Heart Cath;  Surgeon: Bergamo Gordy, MD;  Location: [facility] INVASIVE CV LAB;   Service: Cardiovascular;  Laterality: N/A;   LEFT HEART CATH AND CORONARY ANGIOGRAPHY N/A 10/11/2020   Procedure: LEFT HEART CATH AND CORONARY ANGIOGRAPHY;  Surgeon: Bergamo Gordy, MD;  Location: [facility] INVASIVE CV LAB;  Service: Cardiovascular;  Laterality: N/A;   LOWER EXTREMITY ANGIOGRAM Bilateral 04/18/2012   Procedure: LOWER EXTREMITY ANGIOGRAM;  Surgeon: Lonni GORMAN Blade, MD;  Location: [facility] CATH LAB;  Service: Cardiovascular;  Laterality: Bilateral;  bilat lower extrem angio   LOWER EXTREMITY ANGIOGRAPHY Bilateral 05/02/2019   Procedure: LOWER EXTREMITY ANGIOGRAPHY;  Surgeon: Bergamo Gordy, MD;  Location: [facility] INVASIVE CV LAB;  Service: Cardiovascular;  Laterality: Bilateral;   LOWER EXTREMITY ANGIOGRAPHY Bilateral 02/28/2019   Procedure: LOWER EXTREMITY ANGIOGRAPHY;  Surgeon: Bergamo Gordy, MD;  Location: [facility] INVASIVE CV LAB;  Service: Cardiovascular;  Laterality: Bilateral;   macular photocoagulation     (eye treatments for diabetic retinopathy)-[name]   PERIPHERAL VASCULAR INTERVENTION  02/28/2019   Procedure: PERIPHERAL VASCULAR INTERVENTION;  Surgeon: Bergamo Gordy, MD;  Location: [facility] INVASIVE CV LAB;  Service: Cardiovascular;;   TEE WITHOUT CARDIOVERSION N/A 03/18/2022   Procedure: TRANSESOPHAGEAL ECHOCARDIOGRAM (TEE);  Surgeon: Bergamo Gordy, MD;  Location: [facility] ENDOSCOPY;  Service: Cardiovascular;  Laterality: N/A;   VENTRICULAR ASSIST DEVICE INSERTION N/A 12/31/2020   Procedure: VENTRICULAR ASSIST DEVICE INSERTION;  Surgeon: Bergamo Gordy, MD;  Location: [facility] INVASIVE CV LAB;  Service: Cardiovascular;  Laterality: N/A;   Family History Family History  Problem Relation Age of Onset   Diabetes Mother    Hearing loss Mother    Hypertension Mother    Hyperlipidemia Mother    Heart disease Mother    Varicose Veins Mother    Varicose Veins Father    Dementia Father    Hyperlipidemia Brother    Diabetes Maternal Grandmother     Social History Social History   Tobacco Use   Smoking status:  Former    Current packs/day: 0.00    Average packs/day: 1 pack/day for 30.0 years (30.0 ttl pk-yrs)    Types: Cigarettes    Start  date: 01/07/1982    Quit date: 01/08/2012    Years since quitting: 11.8   Smokeless tobacco: Never  Vaping Use   Vaping status: Never Used  Substance Use Topics   Alcohol  use: Not Currently    Comment: rare   Drug use: No   Allergies Empagliflozin , Shellfish-derived products, Latex, Tape, and Testosterone   Review of Systems Review of Systems  Constitutional:  Positive for appetite change. Negative for chills and fever.  Respiratory:  Negative for cough and shortness of breath.   Cardiovascular:  Negative for chest pain and palpitations.  Gastrointestinal:  Positive for abdominal pain, nausea and vomiting. Negative for diarrhea.  Genitourinary:  Negative for dysuria and urgency.  Musculoskeletal:  Negative for arthralgias.  Neurological:  Negative for syncope and light-headedness.    Physical Exam Vital Signs  I have reviewed the triage vital signs BP 115/89   Pulse 92   Temp 97.7 F (36.5 C) (Oral)   Resp 19   Ht 5' 9 (1.753 m)   Wt 103.7 kg   SpO2 93%   BMI 33.76 kg/m  Physical Exam Vitals and nursing note reviewed.  Constitutional:      General: He is not in acute distress.    Appearance: He is well-developed.  HENT:     Head: Normocephalic and atraumatic.     Right Ear: External ear normal.     Left Ear: External ear normal.     Mouth/Throat:     Mouth: Mucous membranes are moist.  Eyes:     General: No scleral icterus. Cardiovascular:     Rate and Rhythm: Normal rate and regular rhythm.     Pulses: Normal pulses.     Heart sounds: Normal heart sounds.  Pulmonary:     Effort: Pulmonary effort is normal. No respiratory distress.     Breath sounds: Normal breath sounds.  Abdominal:     General: Abdomen is flat.     Palpations: Abdomen is soft.     Tenderness: There is abdominal tenderness in the periumbilical area. There is no  guarding or rebound.    Musculoskeletal:     Cervical back: No rigidity.     Right lower leg: No edema.     Left lower leg: No edema.  Skin:    General: Skin is warm and dry.     Capillary Refill: Capillary refill takes less than 2 seconds.  Neurological:     Mental Status: He is alert.  Psychiatric:        Mood and Affect: Mood normal.        Behavior: Behavior normal.     ED Results and Treatments Labs (all labs ordered are listed, but only abnormal results are displayed) Labs Reviewed  CBC - Abnormal; Notable for the following components:      Result Value   WBC 10.7 (*)    All other components within normal limits  COMPREHENSIVE METABOLIC PANEL - Abnormal; Notable for the following components:   Glucose, Bld 253 (*)    BUN 37 (*)    Creatinine, Ser 1.62 (*)    Total Bilirubin 1.5 (*)    GFR, Estimated 46 (*)    All other components within normal limits  BRAIN NATRIURETIC PEPTIDE - Abnormal; Notable for the following components:   B Natriuretic Peptide 1,274.6 (*)    All other components within normal limits  CBG MONITORING, ED - Abnormal; Notable for the following components:   Glucose-Capillary 253 (*)    All other  components within normal limits  I-STAT CHEM 8, ED - Abnormal; Notable for the following components:   BUN 37 (*)    Creatinine, Ser 1.50 (*)    Glucose, Bld 243 (*)    Calcium , Ion 1.14 (*)    All other components within normal limits  CBG MONITORING, ED - Abnormal; Notable for the following components:   Glucose-Capillary 242 (*)    All other components within normal limits  TROPONIN I (HIGH SENSITIVITY) - Abnormal; Notable for the following components:   Troponin I (High Sensitivity) 25 (*)    All other components within normal limits  TROPONIN I (HIGH SENSITIVITY) - Abnormal; Notable for the following components:   Troponin I (High Sensitivity) 24 (*)    All other components within normal limits  RESP PANEL BY RT-PCR (RSV, FLU A&B, COVID)   RVPGX2  LIPASE, BLOOD  URINALYSIS, ROUTINE W REFLEX MICROSCOPIC  I-STAT CG4 LACTIC ACID, ED                                                                                                                          Radiology CT ABDOMEN PELVIS W CONTRAST Result Date: 11/14/2023 CLINICAL DATA:  Periumbilical abdominal pain with nausea and vomiting EXAM: CT ABDOMEN AND PELVIS WITH CONTRAST TECHNIQUE: Multidetector CT imaging of the abdomen and pelvis was performed using the standard protocol following bolus administration of intravenous contrast. RADIATION DOSE REDUCTION: This exam was performed according to the departmental dose-optimization program which includes automated exposure control, adjustment of the mA and/or kV according to patient size and/or use of iterative reconstruction technique. CONTRAST:  75mL OMNIPAQUE  IOHEXOL  350 MG/ML SOLN COMPARISON:  None Available. FINDINGS: Lower chest: Small bilateral pleural effusions. Interlobular septal thickening and patchy ground-glass opacities in the lower lungs. Dilated left ventricle. Hepatobiliary: 8.8 cm hypoattenuating lesion in the posterior right hepatic lobe. There is suggestion of nodular peripheral enhancement which would be compatible with a benign hemangioma however further evaluation with hepatic protocol MRI of the abdomen is recommended. No biliary dilation. Pancreas: Unremarkable. Spleen: Unremarkable. Adrenals/Urinary Tract: Normal adrenal glands. No urinary calculi or hydronephrosis. Unremarkable bladder. Stomach/Bowel: 2 adjacent transition points in the central mesentery. The small bowel is dilated upstream from the first transition point (series 3/image 46). The small bowel in between the 2 transition points is poorly enhancing with wall thickening and marked interloop fluid and stranding concerning for ischemia. The second transition point is anterior to the first (series 3/image 48) findings are compatible with ischemic bowel due to closed  loop obstruction. The small bowel downstream from the obstruction is normal caliber. Normal caliber colon without wall thickening. Colonic diverticulosis without diverticulitis. Stomach is within normal limits. Normal appendix. Vascular/Lymphatic: Advanced aortic atherosclerotic calcification. No lymphadenopathy. Reproductive: Enlarged prostate. Other: No free intraperitoneal air. Musculoskeletal: No acute fracture. IMPRESSION: 1. Closed loop small bowel obstruction with ischemic bowel in between the 2 transition points in the central mesentery. Emergent surgical consult is recommended. 2. 8.8 cm hypoattenuating lesion  in the posterior right hepatic lobe. There is suggestion of nodular peripheral enhancement which would be compatible with a benign hemangioma. Correlation with prior imaging if available or more definitive evaluation with hepatic protocol MRI is recommended. 3. Small bilateral pleural effusions. Interlobular septal thickening and patchy ground-glass opacities in the lower lungs compatible with pulmonary edema. Aortic Atherosclerosis (ICD10-I70.0). Critical Value/emergent results were called by telephone at the time of interpretation on 11/14/2023 at 3:16 am to provider JAYSON PEREYRA , who verbally acknowledged these results. Electronically Signed   By: Norman Gatlin M.D.   On: 11/14/2023 03:17   DG Chest Portable 1 View Result Date: 11/14/2023 CLINICAL DATA:  Nausea EXAM: PORTABLE CHEST 1 VIEW COMPARISON:  10/21/2022 FINDINGS: Stable cardiomediastinal silhouette. Left chest wall CRT D. layering bilateral pleural effusions and associated airspace opacities. Pulmonary vascular congestion. No pneumothorax. IMPRESSION: Layering bilateral pleural effusions and associated atelectasis or pneumonia. Electronically Signed   By: Norman Gatlin M.D.   On: 11/14/2023 00:58    Pertinent labs & imaging results that were available during my care of the patient were reviewed by me and considered in my medical  decision making (see MDM for details).  Medications Ordered in ED Medications  piperacillin -tazobactam (ZOSYN ) IVPB 3.375 g (3.375 g Intravenous New Bag/Given 11/14/23 0357)  famotidine  (PEPCID ) IVPB 20 mg premix (0 mg Intravenous Stopped 11/14/23 0107)  sodium chloride  0.9 % bolus 1,000 mL (0 mLs Intravenous Stopped 11/14/23 0202)  ondansetron  (ZOFRAN -ODT) disintegrating tablet 4 mg (4 mg Oral Given 11/14/23 0021)  cefTRIAXone  (ROCEPHIN ) 2 g in sodium chloride  0.9 % 100 mL IVPB (0 g Intravenous Stopped 11/14/23 0243)  prochlorperazine  (COMPAZINE ) injection 5 mg (5 mg Intravenous Given 11/14/23 0203)  diphenhydrAMINE  (BENADRYL ) injection 25 mg (25 mg Intravenous Given 11/14/23 0213)  iohexol  (OMNIPAQUE ) 350 MG/ML injection 75 mL (75 mLs Intravenous Contrast Given 11/14/23 0259)  ondansetron  (ZOFRAN ) injection 4 mg (4 mg Intravenous Given 11/14/23 0359)                                                                                                                                     Procedures Procedures  (including critical care time)  CRITICAL CARE Performed by: Jayson DELENA Pereyra   Total critical care time: 59 minutes  Critical care time was exclusive of separately billable procedures and treating other patients.  Critical care was necessary to treat or prevent imminent or life-threatening deterioration.  Critical care was time spent personally by me on the following activities: development of treatment plan with patient and/or surrogate as well as nursing, discussions with consultants, evaluation of patient's response to treatment, examination of patient, obtaining history from patient or surrogate, ordering and performing treatments and interventions, ordering and review of laboratory studies, ordering and review of radiographic studies, pulse oximetry and re-evaluation of patient's condition. Surgical emergency   Medical Decision Making / ED Course    Medical Decision Making:  PIERSON VANTOL is  a 67 y.o. male with past medical history as below, significant for CKD stage III, hypertension, diabetic neuropathy, osteomyelitis of RLE status post BKA, PAD who presents to the ED with complaint of nausea, vomiting, abdominal discomfort. The complaint involves an extensive differential diagnosis and also carries with it a high risk of complications and morbidity.  Serious etiology was considered. Ddx includes but is not limited to: Differential diagnosis includes but is not exclusive to acute appendicitis, renal colic, testicular torsion, urinary tract infection, prostatitis,  diverticulitis, small bowel obstruction, colitis, abdominal aortic aneurysm, gastroenteritis, constipation etc.   Complete initial physical exam performed, notably the patient was in no distress.    Reviewed and confirmed nursing documentation for past medical history, family history, social history.  Vital signs reviewed.      Clinical Course as of 11/14/23 0422  Sat Nov 13, 2023  2336 Creatinine(!): 1.62 Similar to prior  [SG]  Sun Nov 14, 2023  0002 Troponin I (High Sensitivity)(!): 25 Chronic mild elev, he has no cp [SG]  0105 CXR with effusions, ?pna; he denies any fevers/chills/ cough or sig dyspnea. PNA seems less likely but given XR will give dose rocephin .  [SG]  9682 Spoke with radiology, concern for ischemic bowel with closed-loop obstruction; will speak w/ surg. He is on plavix  [SG]  0325 ECHO 11/24 with LVEF 30-35%  [SG]  0340 Spoke w/ Dr Stevie, will see [SG]  0410 Spoke w/ Dr Stevie, taking to OR; requests medicine admit [SG]  0422 B Natriuretic Peptide(!): 1,274.6 Chronically elevated [SG]    Clinical Course User Index [SG] Elnor Jayson LABOR, DO    Brief summary: 68 year old male with history as above including CKD, PAD, right BKA here with nausea, vomiting, abdominal discomfort, gas pain.  Labs reviewed and are stable we will get CT imaging of the abdomen pelvis, chest x-ray given cardiac  history, give GI cocktail, fluids.  CT imaging concerning for ischemic bowel and closed-loop bowel obstruction.  Start Zosyn , send lactate, consult general surgery  Gen surg taking pt to OR. Spoke with Dr Lonzell with [facility] for admission. Pt/family agreeable to care plan, all questions answered at bedside.            Additional history obtained: -Additional history obtained from family -External records from outside source obtained and reviewed including: Chart review including previous notes, labs, imaging, consultation notes including  Home medications, prior echo, prior labs and imaging, primary care documentation   Lab Tests: -I ordered, reviewed, and interpreted labs.   The pertinent results include:   Labs Reviewed  CBC - Abnormal; Notable for the following components:      Result Value   WBC 10.7 (*)    All other components within normal limits  COMPREHENSIVE METABOLIC PANEL - Abnormal; Notable for the following components:   Glucose, Bld 253 (*)    BUN 37 (*)    Creatinine, Ser 1.62 (*)    Total Bilirubin 1.5 (*)    GFR, Estimated 46 (*)    All other components within normal limits  BRAIN NATRIURETIC PEPTIDE - Abnormal; Notable for the following components:   B Natriuretic Peptide 1,274.6 (*)    All other components within normal limits  CBG MONITORING, ED - Abnormal; Notable for the following components:   Glucose-Capillary 253 (*)    All other components within normal limits  I-STAT CHEM 8, ED - Abnormal; Notable for the following components:   BUN 37 (*)  Creatinine, Ser 1.50 (*)    Glucose, Bld 243 (*)    Calcium , Ion 1.14 (*)    All other components within normal limits  CBG MONITORING, ED - Abnormal; Notable for the following components:   Glucose-Capillary 242 (*)    All other components within normal limits  TROPONIN I (HIGH SENSITIVITY) - Abnormal; Notable for the following components:   Troponin I (High Sensitivity) 25 (*)    All other components  within normal limits  TROPONIN I (HIGH SENSITIVITY) - Abnormal; Notable for the following components:   Troponin I (High Sensitivity) 24 (*)    All other components within normal limits  RESP PANEL BY RT-PCR (RSV, FLU A&B, COVID)  RVPGX2  LIPASE, BLOOD  URINALYSIS, ROUTINE W REFLEX MICROSCOPIC  I-STAT CG4 LACTIC ACID, ED    Notable for as above  EKG   EKG Interpretation Date/Time:  Saturday November 13 2023 22:45:25 EST Ventricular Rate:  80 PR Interval:  168 QRS Duration:  144 QT Interval:  456 QTC Calculation: 513 R Axis:   -41  Text Interpretation: Sinus rhythm Multiform ventricular premature complexes Nonspecific IVCD with LAD Inferior infarct, old Abnormal lateral Q waves Probable anteroseptal infarct, old ATRIAL PACED RHYTHM no stemi Confirmed by Elnor Savant ([id number]) on 11/13/2023 11:01:12 PM         Imaging Studies ordered: I ordered imaging studies including CXR CTAP I independently visualized the following imaging with scope of interpretation limited to determining acute life threatening conditions related to emergency care; findings noted above I independently visualized and interpreted imaging. I agree with the radiologist interpretation   Medicines ordered and prescription drug management: Meds ordered this encounter  Medications   DISCONTD: ondansetron  (ZOFRAN ) injection 4 mg   famotidine  (PEPCID ) IVPB 20 mg premix   sodium chloride  0.9 % bolus 1,000 mL   ondansetron  (ZOFRAN -ODT) disintegrating tablet 4 mg   cefTRIAXone  (ROCEPHIN ) 2 g in sodium chloride  0.9 % 100 mL IVPB    Antibiotic Indication::   CAP   prochlorperazine  (COMPAZINE ) injection 5 mg   diphenhydrAMINE  (BENADRYL ) injection 25 mg   iohexol  (OMNIPAQUE ) 350 MG/ML injection 75 mL   piperacillin -tazobactam (ZOSYN ) IVPB 3.375 g    Antibiotic Indication::   Intra-abdominal Infection   ondansetron  (ZOFRAN ) injection 4 mg    -I have reviewed the patients home medicines and have made adjustments as  needed   Consultations Obtained: I requested consultation with the gen surg,  and discussed lab and imaging findings as well as pertinent plan - they recommend: to OR   Cardiac Monitoring: The patient was maintained on a cardiac monitor.  I personally viewed and interpreted the cardiac monitored which showed an underlying rhythm of: NSR Continuous pulse oximetry interpreted by myself, 99% on RA.    Social Determinants of Health:  Diagnosis or treatment significantly limited by social determinants of health: former smoker   Reevaluation: After the interventions noted above, I reevaluated the patient and found that they have improved  Co morbidities that complicate the patient evaluation  Past Medical History:  Diagnosis Date   CHF (congestive heart failure) (HCC)    Chronic kidney disease    stage 3   Colon polyp    Coronary artery disease    Diabetes mellitus 1987   under care of Dr. Tommas.  On insulin  since 96 (off and on)   Diabetic retinopathy    Dupuytren contracture    R hand, s/p injection (Dr. Lorretta)   Essential hypertension, benign    Essential  hypertension, benign 02/06/2019   Frequency of urination and polyuria    Hypertension    Myocardial infarction [facility])    denies   Neuromuscular disorder (HCC)    Diabetic neuropathy   Osteomyelitis (HCC)    right foot   Other testicular hypofunction    Peripheral arterial disease (HCC) 10/28/2012   Peritoneal abscess (HCC) 6/08   and buttock.   Pneumonia    Polydipsia    Proteinuria    Pure hyperglyceridemia    Subacute osteomyelitis, right ankle and foot (HCC)    Wears glasses       Dispostion: Disposition decision including need for hospitalization was considered, and patient admitted to the hospital.    Final Clinical Impression(s) / ED Diagnoses Final diagnoses:  Small bowel ischemia (HCC)  Hepatic lesion  Small bowel obstruction (HCC)        Elnor Jayson LABOR, DO 11/14/23 0422

## 2023-11-13 NOTE — ED Triage Notes (Signed)
 Pt via [facility] from home c/o bloating and gas pain since 3pm today after eating soup. Hx MI with vague symptoms, Abbott pacer. A/O x 4  Paced rhythm 100bpm en route CBG 301, took insulin prior to leaving home O2 99% RA BP 123/77

## 2023-11-14 ENCOUNTER — Emergency Department (HOSPITAL_COMMUNITY): Payer: Medicare Other | Admitting: Anesthesiology

## 2023-11-14 ENCOUNTER — Emergency Department (HOSPITAL_COMMUNITY): Payer: Medicare Other

## 2023-11-14 ENCOUNTER — Encounter (HOSPITAL_COMMUNITY)
Admission: EM | Disposition: A | Discharge status: Home / Self Care | Payer: Self-pay | Source: Home / Self Care | Attending: Internal Medicine

## 2023-11-14 DIAGNOSIS — I447 Left bundle-branch block, unspecified: Secondary | ICD-10-CM | POA: Diagnosis present

## 2023-11-14 DIAGNOSIS — Z794 Long term (current) use of insulin: Secondary | ICD-10-CM | POA: Diagnosis not present

## 2023-11-14 DIAGNOSIS — I340 Nonrheumatic mitral (valve) insufficiency: Secondary | ICD-10-CM | POA: Diagnosis present

## 2023-11-14 DIAGNOSIS — K56609 Unspecified intestinal obstruction, unspecified as to partial versus complete obstruction: Secondary | ICD-10-CM | POA: Diagnosis present

## 2023-11-14 DIAGNOSIS — K559 Vascular disorder of intestine, unspecified: Secondary | ICD-10-CM | POA: Diagnosis present

## 2023-11-14 DIAGNOSIS — I5022 Chronic systolic (congestive) heart failure: Secondary | ICD-10-CM | POA: Diagnosis not present

## 2023-11-14 DIAGNOSIS — R918 Other nonspecific abnormal finding of lung field: Secondary | ICD-10-CM | POA: Diagnosis not present

## 2023-11-14 DIAGNOSIS — N1831 Chronic kidney disease, stage 3a: Secondary | ICD-10-CM | POA: Diagnosis present

## 2023-11-14 DIAGNOSIS — K5669 Other partial intestinal obstruction: Secondary | ICD-10-CM | POA: Diagnosis not present

## 2023-11-14 DIAGNOSIS — K565 Intestinal adhesions [bands], unspecified as to partial versus complete obstruction: Secondary | ICD-10-CM | POA: Diagnosis present

## 2023-11-14 DIAGNOSIS — Z89511 Acquired absence of right leg below knee: Secondary | ICD-10-CM | POA: Diagnosis not present

## 2023-11-14 DIAGNOSIS — R110 Nausea: Secondary | ICD-10-CM | POA: Diagnosis not present

## 2023-11-14 DIAGNOSIS — E669 Obesity, unspecified: Secondary | ICD-10-CM | POA: Diagnosis present

## 2023-11-14 DIAGNOSIS — Z6833 Body mass index (BMI) 33.0-33.9, adult: Secondary | ICD-10-CM | POA: Diagnosis not present

## 2023-11-14 DIAGNOSIS — I2510 Atherosclerotic heart disease of native coronary artery without angina pectoris: Secondary | ICD-10-CM | POA: Diagnosis not present

## 2023-11-14 DIAGNOSIS — K56699 Other intestinal obstruction unspecified as to partial versus complete obstruction: Secondary | ICD-10-CM | POA: Diagnosis not present

## 2023-11-14 DIAGNOSIS — I110 Hypertensive heart disease with heart failure: Secondary | ICD-10-CM

## 2023-11-14 DIAGNOSIS — D1803 Hemangioma of intra-abdominal structures: Secondary | ICD-10-CM | POA: Diagnosis present

## 2023-11-14 DIAGNOSIS — I509 Heart failure, unspecified: Secondary | ICD-10-CM

## 2023-11-14 DIAGNOSIS — K5730 Diverticulosis of large intestine without perforation or abscess without bleeding: Secondary | ICD-10-CM | POA: Diagnosis not present

## 2023-11-14 DIAGNOSIS — E11319 Type 2 diabetes mellitus with unspecified diabetic retinopathy without macular edema: Secondary | ICD-10-CM | POA: Diagnosis present

## 2023-11-14 DIAGNOSIS — K55069 Acute infarction of intestine, part and extent unspecified: Secondary | ICD-10-CM | POA: Diagnosis not present

## 2023-11-14 DIAGNOSIS — R0989 Other specified symptoms and signs involving the circulatory and respiratory systems: Secondary | ICD-10-CM | POA: Diagnosis not present

## 2023-11-14 DIAGNOSIS — E1151 Type 2 diabetes mellitus with diabetic peripheral angiopathy without gangrene: Secondary | ICD-10-CM | POA: Diagnosis present

## 2023-11-14 DIAGNOSIS — R011 Cardiac murmur, unspecified: Secondary | ICD-10-CM | POA: Diagnosis present

## 2023-11-14 DIAGNOSIS — K769 Liver disease, unspecified: Secondary | ICD-10-CM | POA: Diagnosis not present

## 2023-11-14 DIAGNOSIS — R1084 Generalized abdominal pain: Secondary | ICD-10-CM | POA: Diagnosis not present

## 2023-11-14 DIAGNOSIS — K922 Gastrointestinal hemorrhage, unspecified: Secondary | ICD-10-CM | POA: Diagnosis not present

## 2023-11-14 DIAGNOSIS — R112 Nausea with vomiting, unspecified: Secondary | ICD-10-CM | POA: Diagnosis not present

## 2023-11-14 DIAGNOSIS — I130 Hypertensive heart and chronic kidney disease with heart failure and stage 1 through stage 4 chronic kidney disease, or unspecified chronic kidney disease: Secondary | ICD-10-CM | POA: Diagnosis present

## 2023-11-14 DIAGNOSIS — I255 Ischemic cardiomyopathy: Secondary | ICD-10-CM | POA: Diagnosis present

## 2023-11-14 DIAGNOSIS — E1122 Type 2 diabetes mellitus with diabetic chronic kidney disease: Secondary | ICD-10-CM | POA: Diagnosis present

## 2023-11-14 DIAGNOSIS — E781 Pure hyperglyceridemia: Secondary | ICD-10-CM | POA: Diagnosis present

## 2023-11-14 DIAGNOSIS — E1140 Type 2 diabetes mellitus with diabetic neuropathy, unspecified: Secondary | ICD-10-CM | POA: Diagnosis present

## 2023-11-14 DIAGNOSIS — E1165 Type 2 diabetes mellitus with hyperglycemia: Secondary | ICD-10-CM | POA: Diagnosis present

## 2023-11-14 DIAGNOSIS — K567 Ileus, unspecified: Secondary | ICD-10-CM | POA: Diagnosis not present

## 2023-11-14 DIAGNOSIS — Z87891 Personal history of nicotine dependence: Secondary | ICD-10-CM | POA: Diagnosis not present

## 2023-11-14 HISTORY — PX: LAPAROSCOPY: SHX197

## 2023-11-14 HISTORY — PX: LAPAROTOMY: SHX154

## 2023-11-14 HISTORY — PX: BOWEL RESECTION: SHX1257

## 2023-11-14 LAB — CBC
HCT: 38.2 % — ABNORMAL LOW (ref 39.0–52.0)
HCT: 32.6 % — ABNORMAL LOW (ref 39.0–52.0)
Hemoglobin: 12.3 g/dL — ABNORMAL LOW (ref 13.0–17.0)
Hemoglobin: 10.7 g/dL — ABNORMAL LOW (ref 13.0–17.0)
MCH: 30 pg (ref 26.0–34.0)
MCH: 30.1 pg (ref 26.0–34.0)
MCHC: 32.2 g/dL (ref 30.0–36.0)
MCHC: 32.8 g/dL (ref 30.0–36.0)
MCV: 93.2 fL (ref 80.0–100.0)
MCV: 91.8 fL (ref 80.0–100.0)
Platelets: 156 10*3/uL (ref 150–400)
Platelets: 135 10*3/uL — ABNORMAL LOW (ref 150–400)
RBC: 4.1 MIL/uL — ABNORMAL LOW (ref 4.22–5.81)
RBC: 3.55 MIL/uL — ABNORMAL LOW (ref 4.22–5.81)
RDW: 13.2 % (ref 11.5–15.5)
RDW: 13.5 % (ref 11.5–15.5)
WBC: 7.8 10*3/uL (ref 4.0–10.5)
WBC: 8.3 10*3/uL (ref 4.0–10.5)
nRBC: 0 % (ref 0.0–0.2)
nRBC: 0 % (ref 0.0–0.2)

## 2023-11-14 LAB — CREATININE, SERUM
Creatinine, Ser: 1.53 mg/dL — ABNORMAL HIGH (ref 0.61–1.24)
GFR, Estimated: 49 mL/min — ABNORMAL LOW (ref >60)

## 2023-11-14 LAB — RESP PANEL BY RT-PCR (RSV, FLU A&B, COVID)  RVPGX2
Influenza A by PCR: NEGATIVE
Influenza B by PCR: NEGATIVE
Resp Syncytial Virus by PCR: NEGATIVE
SARS Coronavirus 2 by RT PCR: NEGATIVE

## 2023-11-14 LAB — CBG MONITORING, ED: Glucose-Capillary: 242 mg/dL — ABNORMAL HIGH (ref 70–99)

## 2023-11-14 LAB — BRAIN NATRIURETIC PEPTIDE: B Natriuretic Peptide: 1274.6 pg/mL — ABNORMAL HIGH (ref 0.0–100.0)

## 2023-11-14 LAB — TROPONIN I (HIGH SENSITIVITY): Troponin I (High Sensitivity): 24 ng/L — ABNORMAL HIGH (ref <18)

## 2023-11-14 LAB — GLUCOSE, CAPILLARY
Glucose-Capillary: 211 mg/dL — ABNORMAL HIGH (ref 70–99)
Glucose-Capillary: 220 mg/dL — ABNORMAL HIGH (ref 70–99)

## 2023-11-14 LAB — LACTIC ACID, PLASMA: Lactic Acid, Venous: 1.1 mmol/L (ref 0.5–1.9)

## 2023-11-14 LAB — HIV ANTIBODY (ROUTINE TESTING W REFLEX): HIV Screen 4th Generation wRfx: NONREACTIVE

## 2023-11-14 SURGERY — LAPAROSCOPY, DIAGNOSTIC
Anesthesia: General | Site: Abdomen

## 2023-11-14 MED ORDER — LACTATED RINGERS IV SOLN: INTRAVENOUS | Status: DC | PRN

## 2023-11-14 MED ORDER — ONDANSETRON 4 MG PO TBDP
4.0000 mg | ORAL_TABLET | Freq: Once | ORAL | Status: AC
  Administered 2023-11-14: 4 mg via ORAL
  Filled 2023-11-14: qty 1

## 2023-11-14 MED ORDER — ETOMIDATE 2 MG/ML IV SOLN
INTRAVENOUS | Status: DC | PRN
  Administered 2023-11-14: 18 mg via INTRAVENOUS

## 2023-11-14 MED ORDER — BUPIVACAINE HCL (PF) 0.25 % IJ SOLN
INTRAMUSCULAR | Status: AC
  Filled 2023-11-14: qty 30

## 2023-11-14 MED ORDER — DEXTROSE-SODIUM CHLORIDE 5-0.45 % IV SOLN: INTRAVENOUS | Status: AC

## 2023-11-14 MED ORDER — ACETAMINOPHEN 650 MG RE SUPP
650.0000 mg | Freq: Four times a day (QID) | RECTAL | Status: DC | PRN
  Administered 2023-11-14: 650 mg via RECTAL
  Filled 2023-11-14: qty 1

## 2023-11-14 MED ORDER — FENTANYL CITRATE (PF) 100 MCG/2ML IJ SOLN
INTRAMUSCULAR | Status: AC
  Filled 2023-11-14: qty 2

## 2023-11-14 MED ORDER — ONDANSETRON HCL 4 MG/2ML IJ SOLN
4.0000 mg | Freq: Once | INTRAMUSCULAR | Status: AC
  Administered 2023-11-14: 4 mg via INTRAVENOUS
  Filled 2023-11-14: qty 2

## 2023-11-14 MED ORDER — SODIUM CHLORIDE 0.9 % IR SOLN
Status: DC | PRN
  Administered 2023-11-14: 4000 mL

## 2023-11-14 MED ORDER — OXYCODONE HCL 5 MG PO TABS
5.0000 mg | ORAL_TABLET | ORAL | Status: DC | PRN
  Administered 2023-11-15: 5 mg via ORAL
  Filled 2023-11-14: qty 1

## 2023-11-14 MED ORDER — SUCCINYLCHOLINE CHLORIDE 200 MG/10ML IV SOSY
PREFILLED_SYRINGE | INTRAVENOUS | Status: AC
  Filled 2023-11-14: qty 10

## 2023-11-14 MED ORDER — METOPROLOL TARTRATE 5 MG/5ML IV SOLN: 5.0000 mg | Freq: Four times a day (QID) | INTRAVENOUS | Status: DC | PRN

## 2023-11-14 MED ORDER — CALCIUM CHLORIDE 10 % IV SOLN
INTRAVENOUS | Status: DC | PRN
  Administered 2023-11-14: 600 mg via INTRAVENOUS
  Administered 2023-11-14: 400 mg via INTRAVENOUS

## 2023-11-14 MED ORDER — DIPHENHYDRAMINE HCL 12.5 MG/5ML PO ELIX: 12.5000 mg | ORAL_SOLUTION | Freq: Four times a day (QID) | ORAL | Status: DC | PRN

## 2023-11-14 MED ORDER — POLYETHYLENE GLYCOL 3350 17 G PO PACK
17.0000 g | PACK | Freq: Every day | ORAL | Status: DC | PRN
Start: 2023-11-14 — End: 2023-11-15

## 2023-11-14 MED ORDER — PROPOFOL 10 MG/ML IV BOLUS
INTRAVENOUS | Status: AC
  Filled 2023-11-14: qty 20

## 2023-11-14 MED ORDER — PROCHLORPERAZINE EDISYLATE 10 MG/2ML IJ SOLN
5.0000 mg | Freq: Once | INTRAMUSCULAR | Status: AC
  Administered 2023-11-14: 5 mg via INTRAVENOUS
  Filled 2023-11-14: qty 2

## 2023-11-14 MED ORDER — ONDANSETRON HCL 4 MG/2ML IJ SOLN: 4.0000 mg | Freq: Once | INTRAMUSCULAR | Status: DC | PRN

## 2023-11-14 MED ORDER — ALBUMIN HUMAN 5 % IV SOLN: INTRAVENOUS | Status: DC | PRN

## 2023-11-14 MED ORDER — SODIUM CHLORIDE 0.9 % IV SOLN: INTRAVENOUS | Status: DC | PRN

## 2023-11-14 MED ORDER — ACETAMINOPHEN 325 MG PO TABS
650.0000 mg | ORAL_TABLET | Freq: Four times a day (QID) | ORAL | Status: DC | PRN
Start: 2023-11-14 — End: 2023-11-15

## 2023-11-14 MED ORDER — VASOPRESSIN 20 UNIT/ML IV SOLN
INTRAVENOUS | Status: DC | PRN
  Administered 2023-11-14: 2 [IU] via INTRAVENOUS

## 2023-11-14 MED ORDER — FENTANYL CITRATE (PF) 250 MCG/5ML IJ SOLN
INTRAMUSCULAR | Status: AC
  Filled 2023-11-14: qty 5

## 2023-11-14 MED ORDER — INSULIN ASPART 100 UNIT/ML IJ SOLN
0.0000 [IU] | Freq: Three times a day (TID) | INTRAMUSCULAR | Status: DC
  Administered 2023-11-14: 3 [IU] via SUBCUTANEOUS
  Administered 2023-11-15 (×2): 5 [IU] via SUBCUTANEOUS
  Administered 2023-11-15: 8 [IU] via SUBCUTANEOUS

## 2023-11-14 MED ORDER — ROCURONIUM BROMIDE 10 MG/ML (PF) SYRINGE
PREFILLED_SYRINGE | INTRAVENOUS | Status: DC | PRN
  Administered 2023-11-14: 50 mg via INTRAVENOUS

## 2023-11-14 MED ORDER — ACETAMINOPHEN 10 MG/ML IV SOLN
INTRAVENOUS | Status: AC
  Filled 2023-11-14: qty 100

## 2023-11-14 MED ORDER — INSULIN ASPART 100 UNIT/ML IJ SOLN
0.0000 [IU] | INTRAMUSCULAR | Status: DC | PRN
  Administered 2023-11-14: 6 [IU] via SUBCUTANEOUS

## 2023-11-14 MED ORDER — VASOPRESSIN 20 UNIT/ML IV SOLN
INTRAVENOUS | Status: AC
  Filled 2023-11-14: qty 1

## 2023-11-14 MED ORDER — PHENYLEPHRINE 80 MCG/ML (10ML) SYRINGE FOR IV PUSH (FOR BLOOD PRESSURE SUPPORT)
PREFILLED_SYRINGE | INTRAVENOUS | Status: DC | PRN
  Administered 2023-11-14: 160 ug via INTRAVENOUS
  Administered 2023-11-14 (×2): 240 ug via INTRAVENOUS

## 2023-11-14 MED ORDER — FENTANYL CITRATE (PF) 100 MCG/2ML IJ SOLN
25.0000 ug | INTRAMUSCULAR | Status: DC | PRN
Start: 2023-11-14 — End: 2023-11-14
  Administered 2023-11-14 (×2): 25 ug via INTRAVENOUS

## 2023-11-14 MED ORDER — INSULIN ASPART 100 UNIT/ML IJ SOLN: 0.0000 [IU] | Freq: Every day | INTRAMUSCULAR | Status: DC

## 2023-11-14 MED ORDER — SORBITOL 70 % SOLN: 30.0000 mL | Freq: Every day | Status: DC | PRN

## 2023-11-14 MED ORDER — SODIUM CHLORIDE (PF) 0.9 % IJ SOLN
INTRAMUSCULAR | Status: AC
Start: 2023-11-14 — End: ?
  Filled 2023-11-14: qty 10

## 2023-11-14 MED ORDER — LIDOCAINE 2% (20 MG/ML) 5 ML SYRINGE
INTRAMUSCULAR | Status: DC | PRN
  Administered 2023-11-14: 100 mg via INTRAVENOUS

## 2023-11-14 MED ORDER — ONDANSETRON HCL 4 MG/2ML IJ SOLN: 4.0000 mg | Freq: Four times a day (QID) | INTRAMUSCULAR | Status: DC | PRN

## 2023-11-14 MED ORDER — DIPHENHYDRAMINE HCL 50 MG/ML IJ SOLN: 12.5000 mg | Freq: Four times a day (QID) | INTRAMUSCULAR | Status: DC | PRN

## 2023-11-14 MED ORDER — SODIUM CHLORIDE 0.9 % IV SOLN
2.0000 g | Freq: Once | INTRAVENOUS | Status: AC
  Administered 2023-11-14: 2 g via INTRAVENOUS
  Filled 2023-11-14: qty 20

## 2023-11-14 MED ORDER — ONDANSETRON 4 MG PO TBDP: 4.0000 mg | ORAL_TABLET | Freq: Four times a day (QID) | ORAL | Status: DC | PRN

## 2023-11-14 MED ORDER — FENTANYL CITRATE (PF) 250 MCG/5ML IJ SOLN
INTRAMUSCULAR | Status: DC | PRN
  Administered 2023-11-14 (×3): 50 ug via INTRAVENOUS

## 2023-11-14 MED ORDER — CALCIUM CHLORIDE 10 % IV SOLN
INTRAVENOUS | Status: AC
  Filled 2023-11-14: qty 10

## 2023-11-14 MED ORDER — MORPHINE SULFATE (PF) 2 MG/ML IV SOLN
2.0000 mg | INTRAVENOUS | Status: DC | PRN
  Administered 2023-11-14 (×4): 2 mg via INTRAVENOUS
  Filled 2023-11-14 (×4): qty 1

## 2023-11-14 MED ORDER — ETOMIDATE 2 MG/ML IV SOLN
INTRAVENOUS | Status: AC
  Filled 2023-11-14: qty 10

## 2023-11-14 MED ORDER — ACETAMINOPHEN 10 MG/ML IV SOLN
INTRAVENOUS | Status: DC | PRN
  Administered 2023-11-14: 1000 mg via INTRAVENOUS

## 2023-11-14 MED ORDER — SUGAMMADEX SODIUM 200 MG/2ML IV SOLN
INTRAVENOUS | Status: DC | PRN
  Administered 2023-11-14: 200 mg via INTRAVENOUS

## 2023-11-14 MED ORDER — LIDOCAINE 2% (20 MG/ML) 5 ML SYRINGE
INTRAMUSCULAR | Status: AC
  Filled 2023-11-14: qty 5

## 2023-11-14 MED ORDER — INSULIN ASPART 100 UNIT/ML IJ SOLN
2.0000 [IU] | Freq: Once | INTRAMUSCULAR | Status: AC
  Administered 2023-11-14: 2 [IU] via SUBCUTANEOUS

## 2023-11-14 MED ORDER — IOHEXOL 350 MG/ML SOLN
75.0000 mL | Freq: Once | INTRAVENOUS | Status: AC | PRN
  Administered 2023-11-14: 75 mL via INTRAVENOUS

## 2023-11-14 MED ORDER — PHENYLEPHRINE HCL-NACL 20-0.9 MG/250ML-% IV SOLN
INTRAVENOUS | Status: DC | PRN
  Administered 2023-11-14: 50 ug/min via INTRAVENOUS

## 2023-11-14 MED ORDER — PIPERACILLIN-TAZOBACTAM 3.375 G IVPB 30 MIN
3.3750 g | Freq: Once | INTRAVENOUS | Status: AC
  Administered 2023-11-14: 3.375 g via INTRAVENOUS
  Filled 2023-11-14: qty 50

## 2023-11-14 MED ORDER — DIPHENHYDRAMINE HCL 50 MG/ML IJ SOLN
25.0000 mg | Freq: Once | INTRAMUSCULAR | Status: AC
  Administered 2023-11-14: 25 mg via INTRAVENOUS
  Filled 2023-11-14: qty 1

## 2023-11-14 MED ORDER — ENOXAPARIN SODIUM 40 MG/0.4ML IJ SOSY
40.0000 mg | PREFILLED_SYRINGE | Freq: Every day | INTRAMUSCULAR | Status: DC
  Administered 2023-11-14 – 2023-11-17 (×4): 40 mg via SUBCUTANEOUS
  Filled 2023-11-14 (×4): qty 0.4

## 2023-11-14 MED ORDER — SUCCINYLCHOLINE CHLORIDE 200 MG/10ML IV SOSY
PREFILLED_SYRINGE | INTRAVENOUS | Status: DC | PRN
  Administered 2023-11-14: 120 mg via INTRAVENOUS

## 2023-11-14 SURGICAL SUPPLY — 50 items
BAG COUNTER SPONGE SURGICOUNT (BAG) ×1 IMPLANT
BLADE CLIPPER SURG (BLADE) IMPLANT
CANISTER SUCT 3000ML PPV (MISCELLANEOUS) IMPLANT
CHLORAPREP W/TINT 26 (MISCELLANEOUS) ×1 IMPLANT
COVER SURGICAL LIGHT HANDLE (MISCELLANEOUS) ×1 IMPLANT
DERMABOND ADVANCED .7 DNX12 (GAUZE/BANDAGES/DRESSINGS) ×1 IMPLANT
DRSG OPSITE POSTOP 4X10 (GAUZE/BANDAGES/DRESSINGS) IMPLANT
ELECT EZ MEGADYNE CLEAN NT 3X6 (ELECTRODE) IMPLANT
ELECT REM PT RETURN 9FT ADLT (ELECTROSURGICAL) ×1
ELECTRODE REM PT RTRN 9FT ADLT (ELECTROSURGICAL) ×1 IMPLANT
GLOVE BIOGEL PI IND STRL 6.5 (GLOVE) IMPLANT
GLOVE BIOGEL PI IND STRL 7.0 (GLOVE) ×1 IMPLANT
GLOVE SURG SS PI 7.0 STRL IVOR (GLOVE) ×1 IMPLANT
GOWN STRL REUS W/ TWL LRG LVL3 (GOWN DISPOSABLE) ×3 IMPLANT
HANDLE SUCTION POOLE (INSTRUMENTS) IMPLANT
IRRIG SUCT STRYKERFLOW 2 WTIP (MISCELLANEOUS)
IRRIGATION SUCT STRKRFLW 2 WTP (MISCELLANEOUS) IMPLANT
KIT BASIN OR (CUSTOM PROCEDURE TRAY) ×1 IMPLANT
KIT TURNOVER KIT B (KITS) ×1 IMPLANT
LIGASURE IMPACT 36 18CM CVD LR (INSTRUMENTS) IMPLANT
NDL HYPO 22X1.5 SAFETY MO (MISCELLANEOUS) IMPLANT
NEEDLE HYPO 22X1.5 SAFETY MO (MISCELLANEOUS) ×1 IMPLANT
NS IRRIG 1000ML POUR BTL (IV SOLUTION) ×1 IMPLANT
PAD ARMBOARD 7.5X6 YLW CONV (MISCELLANEOUS) ×2 IMPLANT
PENCIL BUTTON HOLSTER BLD 10FT (ELECTRODE) IMPLANT
PENCIL SMOKE EVAC PLUME ELITE (ELECTROSURGICAL) IMPLANT
RELOAD PROXIMATE 75MM BLUE (ENDOMECHANICALS) ×2 IMPLANT
RELOAD PROXIMATE TA60MM BLUE (ENDOMECHANICALS) ×1 IMPLANT
RELOAD STAPLE 60 BLU REG PROX (ENDOMECHANICALS) IMPLANT
RELOAD STAPLE 75 3.8 BLU REG (ENDOMECHANICALS) IMPLANT
SCISSORS LAP 5X35 DISP (ENDOMECHANICALS) IMPLANT
SET TUBE SMOKE EVAC HIGH FLOW (TUBING) ×1 IMPLANT
SLEEVE Z-THREAD 5X100MM (TROCAR) ×1 IMPLANT
SPONGE LAP 18X18 X RAY DECT (DISPOSABLE) IMPLANT
STAPLER GUN LINEAR PROX 60 (STAPLE) IMPLANT
STAPLER PROXIMATE 75MM BLUE (STAPLE) IMPLANT
STAPLER SKIN PROX WIDE 3.9 (STAPLE) IMPLANT
SUCTION POOLE HANDLE (INSTRUMENTS) ×1
SUT MNCRL AB 4-0 PS2 18 (SUTURE) ×1 IMPLANT
SUT PDS AB 0 CT1 27 (SUTURE) IMPLANT
SUT SILK 2 0 SH CR/8 (SUTURE) IMPLANT
SUT SILK 3 0 SH CR/8 (SUTURE) IMPLANT
TOWEL GREEN STERILE (TOWEL DISPOSABLE) ×1 IMPLANT
TOWEL GREEN STERILE FF (TOWEL DISPOSABLE) ×1 IMPLANT
TRAY FOLEY MTR SLVR 14FR STAT (SET/KITS/TRAYS/PACK) IMPLANT
TRAY LAPAROSCOPIC MC (CUSTOM PROCEDURE TRAY) ×1 IMPLANT
TROCAR 11X100 Z THREAD (TROCAR) IMPLANT
TROCAR Z-THREAD OPTICAL 5X100M (TROCAR) ×1 IMPLANT
TUBE CONNECTING 12X1/4 (SUCTIONS) IMPLANT
WARMER LAPAROSCOPE (MISCELLANEOUS) ×1 IMPLANT

## 2023-11-14 NOTE — ED Notes (Signed)
 Patient became nauseated and started throwing up. Sloan Leiter, DO made aware and stated will place orders.

## 2023-11-14 NOTE — Anesthesia Postprocedure Evaluation (Signed)
 Anesthesia Post Note  Patient: Eduardo [name]  Procedure(s) Performed: LAPAROSCOPY DIAGNOSTIC (Abdomen) EXPLORATORY LAPAROTOMY (Abdomen) SMALL BOWEL RESECTION (Abdomen)     Patient location during evaluation: PACU Anesthesia Type: General Level of consciousness: awake and alert Pain management: pain level controlled Vital Signs Assessment: post-procedure vital signs reviewed and stable Respiratory status: spontaneous breathing, nonlabored ventilation, respiratory function stable and patient connected to nasal cannula oxygen Cardiovascular status: blood pressure returned to baseline and stable Postop Assessment: no apparent nausea or vomiting Anesthetic complications: no   There were no known notable events for this encounter.  Last Vitals:    Last Pain:                 Garnette FORBES Skillern

## 2023-11-14 NOTE — Progress Notes (Signed)
 ED Pharmacy Antibiotic Sign Off An antibiotic consult was received from an ED provider for zosyn  per pharmacy dosing for intra-abdominal infection. A chart review was completed to assess appropriateness.   The following one time order(s) were placed:  Zosyn  3.375g   Further antibiotic and/or antibiotic pharmacy consults should be ordered by the admitting provider if indicated.   Thank you for allowing pharmacy to be a part of this patient's care.   Leonor GORMAN Bash, [facility]  Clinical Pharmacist 11/14/23 3:25 AM

## 2023-11-14 NOTE — Anesthesia Preprocedure Evaluation (Addendum)
 Anesthesia Evaluation  Patient identified by MRN, date of birth, ID band Patient awake    Reviewed: Allergy & Precautions, NPO status , Patient's Chart, lab work & pertinent test results  Airway Mallampati: III  TM Distance: >3 FB Neck ROM: Full    Dental  (+) Teeth Intact, Dental Advisory Given   Pulmonary former smoker   Pulmonary exam normal breath sounds clear to auscultation       Cardiovascular hypertension, Pt. on medications + CAD, + Past MI, + Peripheral Vascular Disease and +CHF  Normal cardiovascular exam+ dysrhythmias (LBBB) + Cardiac Defibrillator  Rhythm:Regular Rate:Normal  Echo 09/22/23: 1. Left ventricular ejection fraction, by estimation, is 30 to 35%. The  left ventricle has moderately decreased function. The left ventricle  demonstrates regional wall motion abnormalities (see scoring  diagram/findings for description). Left ventricular   diastolic function could not be evaluated.   2. Right ventricular systolic function is normal. The right ventricular  size is moderately enlarged. There is moderately elevated pulmonary artery  systolic pressure.   3. Left atrial size was mildly dilated.   4. The mitral valve is normal in structure. Moderate to severe mitral  valve regurgitation. No evidence of mitral stenosis.   5. The aortic valve is calcified. Aortic valve regurgitation is not  visualized. Aortic valve sclerosis/calcification is present, without any  evidence of aortic stenosis.   6. The inferior vena cava is normal in size with greater than 50%  respiratory variability, suggesting right atrial pressure of 3 mmHg.     Neuro/Psych  Neuromuscular disease    GI/Hepatic Neg liver ROS,,,Closed Loop Obstruction   Endo/Other  diabetes, Type 2  Obesity   Renal/GU Renal InsufficiencyRenal disease     Musculoskeletal S/p R BKA   Abdominal   Peds  Hematology  (+) Blood dyscrasia (Plavix )    Anesthesia Other Findings Day of surgery medications reviewed with the patient.  Reproductive/Obstetrics                              Anesthesia Physical Anesthesia Plan  ASA: 4 and emergent  Anesthesia Plan: General   Post-op Pain Management: Ofirmev  IV (intra-op)*   Induction: Intravenous  PONV Risk Score and Plan: 3 and Dexamethasone  and Ondansetron   Airway Management Planned: Oral ETT  Additional Equipment:   Intra-op Plan:   Post-operative Plan: Possible Post-op intubation/ventilation  Informed Consent: I have reviewed the patients History and Physical, chart, labs and discussed the procedure including the risks, benefits and alternatives for the proposed anesthesia with the patient or authorized representative who has indicated his/her understanding and acceptance.     Dental advisory given  Plan Discussed with: CRNA  Anesthesia Plan Comments:          Anesthesia Quick Evaluation

## 2023-11-14 NOTE — H&P (Signed)
 History and Physical    Eduardo [name] FMW:[id number] DOB: April 17, 1956 DOA: 11/13/2023  PCP: [name], MD   Chief Complaint: Abdominal pin  HPI: Eduardo [name] is a 68 y.o. male with medical history significant of CAD s/p multiple PCI(not candidate for CABG), CKD stage IIIa, hypertension, HFrEF s/p ICD placement, DM2 with diabetic neuropathy, osteomyelitis of RLE status post BKA, PAD who presents to the ED with complaint of nausea, vomiting, abdominal pain - unable to tolerate PO at home.  Based on imaging at intake patient has notable small bowel obstruction, ED consulted surgery for evaluation hospitalist called for admission.  Review of Systems: As per HPI limited given patient's mental status postoperatively, review of systems per wife at bedside indicates somewhat acute onsets abdominal pain with minimal distention.  Denies nausea vomiting diarrhea constipation headache fevers chills or chest pain.  Assessment/Plan Principal Problem:   Small bowel obstruction (HCC)   Closed loop bowel obstruction  -Acute onset, status post diagnostic laparoscopy, small bowel resection with anastomosis 1/5 -Tolerated procedure well, NG tube to intermittent suction, n.p.o. (scant ice chips per surgery in the interim) -Pain currently well-controlled on current regimen  CKD3a -Appears to be at baseline, IV fluids ongoing while n.p.o., monitor volume status closely  DM2, well controlled -Recent A1c within normal limits, no indication to repeat -Continue sliding scale insulin , currently on D5 postoperatively -NG to low intermittent suction, will advance towards diabetic diet once p.o. route is established safely  CAD, obstructive s/p multiple PCI -Currently unable to resume home meds given n.p.o. status  - Plavix , Zetia , gemfibrozil , Crestor , Entresto , torsemide  currently on hold  HTN -Holding torsemide , Entresto  as above  HF-rEF s/p ICD -Monitor volume status closely, n.p.o. with D5 ongoing, unable  to initiate p.o. interest over torsemide  -If hypovolemia or respiratory symptoms, low threshold to initiate IV diuretics  Prior history of osteo/BKA(R) PAD -Stable, PT OT as appropriate postoperatively  DVT prophylaxis: enoxaparin  (LOVENOX ) injection 40 mg Start: 11/14/23 1800 SCDs Start: 11/14/23 0647 Code Status: Full Family Communication: Wife at bedside Status is: Inpatient  Dispo: The patient is from: Home              Anticipated d/c is to: To be determined              Anticipated d/c date is: 72+ hours              Patient currently not medically stable for discharge given recent surgical evaluation and intervention  Consultants:  General surgery  Procedures:  Small bowel resection with anastomosis 11/14/2023   Past Medical History:  Diagnosis Date   CHF (congestive heart failure) (HCC)    Chronic kidney disease    stage 3   Colon polyp    Coronary artery disease    Diabetes mellitus 1987   under care of Dr. Tommas.  On insulin  since 96 (off and on)   Diabetic retinopathy    Dupuytren contracture    R hand, s/p injection (Dr. Lorretta)   Essential hypertension, benign    Essential hypertension, benign 02/06/2019   Frequency of urination and polyuria    Hypertension    Myocardial infarction [facility])    denies   Neuromuscular disorder (HCC)    Diabetic neuropathy   Osteomyelitis (HCC)    right foot   Other testicular hypofunction    Peripheral arterial disease (HCC) 10/28/2012   Peritoneal abscess (HCC) 6/08   and buttock.   Pneumonia    Polydipsia  Proteinuria    Pure hyperglyceridemia    Subacute osteomyelitis, right ankle and foot (HCC)    Wears glasses     Past Surgical History:  Procedure Laterality Date   ABDOMINAL AORTAGRAM N/A 04/18/2012   Procedure: ABDOMINAL EZELLA;  Surgeon: Lonni GORMAN Blade, MD;  Location: [facility] CATH LAB;  Service: Cardiovascular;  Laterality: N/A;   AMPUTATION Right 05/19/2019   Procedure: RIGHT FOOT 5TH RAY AMPUTATION;   Surgeon: Harden Jerona GAILS, MD;  Location: [facility] OR;  Service: Orthopedics;  Laterality: Right;   AMPUTATION Right 03/11/2022   Procedure: RIGHT LEG DEBRIDEMENT VS. BELOW KNEE AMPUTATION;  Surgeon: Harden Jerona GAILS, MD;  Location: [facility] OR;  Service: Orthopedics;  Laterality: Right;   BIV ICD INSERTION CRT-D N/A 10/21/2022   Procedure: BIV ICD INSERTION CRT-D;  Surgeon: Waddell Danelle ORN, MD;  Location: [facility] INVASIVE CV LAB;  Service: Cardiovascular;  Laterality: N/A;   CARDIAC CATHETERIZATION N/A 09/08/2016   Procedure: Left Heart Cath and Coronary Angiography;  Surgeon: Gordy Bergamo, MD;  Location: [facility] INVASIVE CV LAB;  Service: Cardiovascular;  Laterality: N/A;   CATARACT EXTRACTION, BILATERAL  09/2017, 10/2017   Dr. Cleatus   COLONOSCOPY W/ BIOPSIES AND POLYPECTOMY     CORONARY/GRAFT ACUTE MI REVASCULARIZATION N/A 10/11/2020   Procedure: Coronary/Graft Acute MI Revascularization;  Surgeon: Bergamo Gordy, MD;  Location: [facility] INVASIVE CV LAB;  Service: Cardiovascular;  Laterality: N/A;   I & D EXTREMITY Right 03/11/2022   Procedure: BELOW KNEE AMPUTATION;  Surgeon: Harden Jerona GAILS, MD;  Location: [facility] OR;  Service: Orthopedics;  Laterality: Right;   INSERT / REPLACE / REMOVE PACEMAKER     LEFT HEART CATH N/A 12/31/2020   Procedure: Left Heart Cath;  Surgeon: Bergamo Gordy, MD;  Location: [facility] INVASIVE CV LAB;  Service: Cardiovascular;  Laterality: N/A;   LEFT HEART CATH AND CORONARY ANGIOGRAPHY N/A 10/11/2020   Procedure: LEFT HEART CATH AND CORONARY ANGIOGRAPHY;  Surgeon: Bergamo Gordy, MD;  Location: [facility] INVASIVE CV LAB;  Service: Cardiovascular;  Laterality: N/A;   LOWER EXTREMITY ANGIOGRAM Bilateral 04/18/2012   Procedure: LOWER EXTREMITY ANGIOGRAM;  Surgeon: Lonni GORMAN Blade, MD;  Location: [facility] CATH LAB;  Service: Cardiovascular;  Laterality: Bilateral;  bilat lower extrem angio   LOWER EXTREMITY ANGIOGRAPHY Bilateral 05/02/2019   Procedure: LOWER EXTREMITY ANGIOGRAPHY;  Surgeon: Bergamo Gordy, MD;  Location: [facility] INVASIVE  CV LAB;  Service: Cardiovascular;  Laterality: Bilateral;   LOWER EXTREMITY ANGIOGRAPHY Bilateral 02/28/2019   Procedure: LOWER EXTREMITY ANGIOGRAPHY;  Surgeon: Bergamo Gordy, MD;  Location: [facility] INVASIVE CV LAB;  Service: Cardiovascular;  Laterality: Bilateral;   macular photocoagulation     (eye treatments for diabetic retinopathy)-[name]   PERIPHERAL VASCULAR INTERVENTION  02/28/2019   Procedure: PERIPHERAL VASCULAR INTERVENTION;  Surgeon: Bergamo Gordy, MD;  Location: [facility] INVASIVE CV LAB;  Service: Cardiovascular;;   TEE WITHOUT CARDIOVERSION N/A 03/18/2022   Procedure: TRANSESOPHAGEAL ECHOCARDIOGRAM (TEE);  Surgeon: Bergamo Gordy, MD;  Location: [facility] ENDOSCOPY;  Service: Cardiovascular;  Laterality: N/A;   VENTRICULAR ASSIST DEVICE INSERTION N/A 12/31/2020   Procedure: VENTRICULAR ASSIST DEVICE INSERTION;  Surgeon: Bergamo Gordy, MD;  Location: [facility] INVASIVE CV LAB;  Service: Cardiovascular;  Laterality: N/A;     reports that he quit smoking about 11 years ago. His smoking use included cigarettes. He started smoking about 41 years ago. He has a 30 pack-year smoking history. He has never used smokeless tobacco. He reports that he does not currently use alcohol . He reports that he does not use drugs.  Allergies  Allergen Reactions   Empagliflozin      Other Reaction(s): foot infection   Shellfish-Derived Products Nausea And Vomiting and Other (See Comments)    Only Mussels cause severe nausea and vomiting    Latex Rash   Tape Rash and Other (See Comments)    Caused issues with the skin   Testosterone  Rash    did not feel well     Family History  Problem Relation Age of Onset   Diabetes Mother    Hearing loss Mother    Hypertension Mother    Hyperlipidemia Mother    Heart disease Mother    Varicose Veins Mother    Varicose Veins Father    Dementia Father    Hyperlipidemia Brother    Diabetes Maternal Grandmother     Prior to Admission medications   Medication Sig Start Date End Date  Taking? Authorizing Provider  acetaminophen  (TYLENOL ) 325 MG tablet Take 1-2 tablets (325-650 mg total) by mouth every 4 (four) hours as needed for mild pain. 03/31/22  Yes Setzer, Sandra J, PA-C  Bioflavonoid Products (ESTER C PO) Take 1,000 mg by mouth daily.   Yes [provider]  Cholecalciferol  (VITAMIN D ) 2000 units CAPS Take 2,000 Units by mouth daily.   Yes [provider]  clopidogrel  (PLAVIX ) 75 MG tablet TAKE 1 TABLET BY MOUTH DAILY 01/29/23  Yes Custovic, Sabina, DO  ezetimibe  (ZETIA ) 10 MG tablet TAKE 1 TABLET BY MOUTH DAILY 07/08/23  Yes Patwardhan, Manish J, MD  gemfibrozil  (LOPID ) 600 MG tablet Take 600 mg by mouth 2 (two) times daily before a meal.   Yes [provider]  HUMALOG KWIKPEN 100 UNIT/ML KiwkPen Inject 3-10 Units into the skin 3 (three) times daily. Inject 01-1009 units into the skin three times a day, per sliding scale- based on BGL >100 08/02/16  Yes [provider]  Anselm Oil 300 MG CAPS Take 300 mg by mouth daily.   Yes [provider]  melatonin 10 MG TABS Take 10 mg by mouth at bedtime. 03/31/22  Yes Setzer, Sandra J, PA-C  MOUNJARO 10 MG/0.5ML Pen Inject 10 mg into the skin once a week. 02/03/23  Yes [provider]  Multiple Vitamin (MULTIVITAMIN WITH MINERALS) TABS tablet Take 1 tablet by mouth daily.   Yes [provider]  nitroGLYCERIN  (NITROSTAT ) 0.4 MG SL tablet Place 1 tablet (0.4 mg total) under the tongue every 5 (five) minutes as needed for chest pain. 05/08/21  Yes Cantwell, Celeste C, PA-C  Probiotic Product (PROBIOTIC-10 PO) Take 1 capsule by mouth daily.   Yes [provider]  rosuvastatin  (CRESTOR ) 20 MG tablet Take 1 tablet (20 mg total) by mouth daily. 07/22/20  Yes Ladona Heinz, MD  sacubitril -valsartan  (ENTRESTO ) 24-26 MG TAKE 1 TABLET BY MOUTH TWICE  DAILY 10/11/23  Yes Patwardhan, Manish J, MD  sodium chloride  (OCEAN) 0.65 % SOLN nasal spray Place 1 spray into both nostrils as needed  for congestion.   Yes [provider]  torsemide  (DEMADEX ) 20 MG tablet Take 1 tablet (20 mg total) by mouth daily. Take additional one tablet by mouth as needed for swelling 08/24/23  Yes Elmira Newman PARAS, MD    Physical Exam: Vitals:   11/14/23 0345 11/14/23 0620 11/14/23 0635 11/14/23 0650  BP: 115/89 (!) 116/51 (!) 106/52 (!) 102/49  Pulse: 92 98 91 92  Resp: 19 15 12 11   Temp:  98 F (36.7 C)    TempSrc:      SpO2: 93% 99%  95% 96%  Weight:      Height:        Constitutional: NAD, calm, comfortable Vitals:   11/14/23 0345 11/14/23 0620 11/14/23 0635 11/14/23 0650  BP: 115/89 (!) 116/51 (!) 106/52 (!) 102/49  Pulse: 92 98 91 92  Resp: 19 15 12 11   Temp:  98 F (36.7 C)    TempSrc:      SpO2: 93% 99% 95% 96%  Weight:      Height:       General:  Pleasantly resting in bed, No acute distress. Neck:  Without mass or deformity.  Trachea is midline. Lungs: Diminished bilaterally without overt wheeze rhonchi or rales Heart:  Regular rate and rhythm.  Without murmurs, rubs, or gallops. Abdomen: Soft, diffusely tender, postoperative bandage clean dry intact Extremities: Without cyanosis, clubbing, right BKA noted Skin:  Warm and dry, no erythema.  Labs on Admission: I have personally reviewed following labs and imaging studies  CBC: Recent Labs  Lab 11/13/23 2230 11/13/23 2241  WBC 10.7*  --   HGB 15.0 16.0  HCT 46.5 47.0  MCV 92.3  --   PLT 160  --    Basic Metabolic Panel: Recent Labs  Lab 11/13/23 2230 11/13/23 2241  NA 138 139  K 4.2 4.2  CL 100 101  CO2 25  --   GLUCOSE 253* 243*  BUN 37* 37*  CREATININE 1.62* 1.50*  CALCIUM  9.5  --    GFR: Estimated Creatinine Clearance: 55.9 mL/min (A) (by C-G formula based on SCr of 1.5 mg/dL (H)).  Liver Function Tests: Recent Labs  Lab 11/13/23 2230  AST 34  ALT 30  ALKPHOS 77  BILITOT 1.5*  PROT 7.3  ALBUMIN  4.0   Recent Labs  Lab 11/13/23 2230  LIPASE 27   CBG: Recent Labs  Lab  11/13/23 2231 11/14/23 0224 11/14/23 0624  GLUCAP 253* 242* 211*   Urine analysis:    Component Value Date/Time   BILIRUBINUR negative 06/26/2021 1300   BILIRUBINUR NEG 10/13/2012 0925   KETONESUR negative 06/26/2021 1300   PROTEINUR SMALL 10/13/2012 0925   UROBILINOGEN 0.2 06/26/2021 1300   NITRITE Negative 06/26/2021 1300   NITRITE NEG 10/13/2012 0925   LEUKOCYTESUR Negative 06/26/2021 1300    Radiological Exams on Admission: CT ABDOMEN PELVIS W CONTRAST Result Date: 11/14/2023 CLINICAL DATA:  Periumbilical abdominal pain with nausea and vomiting EXAM: CT ABDOMEN AND PELVIS WITH CONTRAST TECHNIQUE: Multidetector CT imaging of the abdomen and pelvis was performed using the standard protocol following bolus administration of intravenous contrast. RADIATION DOSE REDUCTION: This exam was performed according to the departmental dose-optimization program which includes automated exposure control, adjustment of the mA and/or kV according to patient size and/or use of iterative reconstruction technique. CONTRAST:  75mL OMNIPAQUE  IOHEXOL  350 MG/ML SOLN COMPARISON:  None Available. FINDINGS: Lower chest: Small bilateral pleural effusions. Interlobular septal thickening and patchy ground-glass opacities in the lower lungs. Dilated left ventricle. Hepatobiliary: 8.8 cm hypoattenuating lesion in the posterior right hepatic lobe. There is suggestion of nodular peripheral enhancement which would be compatible with a benign hemangioma however further evaluation with hepatic protocol MRI of the abdomen is recommended. No biliary dilation. Pancreas: Unremarkable. Spleen: Unremarkable. Adrenals/Urinary Tract: Normal adrenal glands. No urinary calculi or hydronephrosis. Unremarkable bladder. Stomach/Bowel: 2 adjacent transition points in the central mesentery. The small bowel is dilated upstream from the first transition point (series 3/image 46). The small bowel in between the 2 transition points is poorly  enhancing with wall thickening  and marked interloop fluid and stranding concerning for ischemia. The second transition point is anterior to the first (series 3/image 48) findings are compatible with ischemic bowel due to closed loop obstruction. The small bowel downstream from the obstruction is normal caliber. Normal caliber colon without wall thickening. Colonic diverticulosis without diverticulitis. Stomach is within normal limits. Normal appendix. Vascular/Lymphatic: Advanced aortic atherosclerotic calcification. No lymphadenopathy. Reproductive: Enlarged prostate. Other: No free intraperitoneal air. Musculoskeletal: No acute fracture. IMPRESSION: 1. Closed loop small bowel obstruction with ischemic bowel in between the 2 transition points in the central mesentery. Emergent surgical consult is recommended. 2. 8.8 cm hypoattenuating lesion in the posterior right hepatic lobe. There is suggestion of nodular peripheral enhancement which would be compatible with a benign hemangioma. Correlation with prior imaging if available or more definitive evaluation with hepatic protocol MRI is recommended. 3. Small bilateral pleural effusions. Interlobular septal thickening and patchy ground-glass opacities in the lower lungs compatible with pulmonary edema. Aortic Atherosclerosis (ICD10-I70.0). Critical Value/emergent results were called by telephone at the time of interpretation on 11/14/2023 at 3:16 am to provider JAYSON PEREYRA , who verbally acknowledged these results. Electronically Signed   By: Norman Gatlin M.D.   On: 11/14/2023 03:17   DG Chest Portable 1 View Result Date: 11/14/2023 CLINICAL DATA:  Nausea EXAM: PORTABLE CHEST 1 VIEW COMPARISON:  10/21/2022 FINDINGS: Stable cardiomediastinal silhouette. Left chest wall CRT D. layering bilateral pleural effusions and associated airspace opacities. Pulmonary vascular congestion. No pneumothorax. IMPRESSION: Layering bilateral pleural effusions and associated atelectasis  or pneumonia. Electronically Signed   By: Norman Gatlin M.D.   On: 11/14/2023 00:58    EKG: Independently reviewed.   Elsie JAYSON Montclair DO [facility] For contact please use secure messenger on Epic  If 7PM-7AM, please contact night-coverage located on [email or web address]   11/14/2023, 7:08 AM

## 2023-11-14 NOTE — Consult Note (Signed)
 Reason for Consult:bowel obstruction Referring Provider: [name] is an 68 y.o. male.  HPI: 68 yo male with 1 day of acute abdominal discomfort and nausea and vomiting. He has never been nauseated before. He has never had surgery on his abdomen. Discomfort is diffuse and constant.  Past Medical History:  Diagnosis Date   CHF (congestive heart failure) (HCC)    Chronic kidney disease    stage 3   Colon polyp    Coronary artery disease    Diabetes mellitus 1987   under care of Dr. Tommas.  On insulin  since 96 (off and on)   Diabetic retinopathy    Dupuytren contracture    R hand, s/p injection (Dr. Lorretta)   Essential hypertension, benign    Essential hypertension, benign 02/06/2019   Frequency of urination and polyuria    Hypertension    Myocardial infarction [facility])    denies   Neuromuscular disorder (HCC)    Diabetic neuropathy   Osteomyelitis (HCC)    right foot   Other testicular hypofunction    Peripheral arterial disease (HCC) 10/28/2012   Peritoneal abscess (HCC) 6/08   and buttock.   Pneumonia    Polydipsia    Proteinuria    Pure hyperglyceridemia    Subacute osteomyelitis, right ankle and foot (HCC)    Wears glasses     Past Surgical History:  Procedure Laterality Date   ABDOMINAL AORTAGRAM N/A 04/18/2012   Procedure: ABDOMINAL EZELLA;  Surgeon: Eduardo GORMAN Blade, MD;  Location: [facility] CATH LAB;  Service: Cardiovascular;  Laterality: N/A;   AMPUTATION Right 05/19/2019   Procedure: RIGHT FOOT 5TH RAY AMPUTATION;  Surgeon: Eduardo Jerona GAILS, MD;  Location: [facility] OR;  Service: Orthopedics;  Laterality: Right;   AMPUTATION Right 03/11/2022   Procedure: RIGHT LEG DEBRIDEMENT VS. BELOW KNEE AMPUTATION;  Surgeon: Eduardo Jerona GAILS, MD;  Location: [facility] OR;  Service: Orthopedics;  Laterality: Right;   BIV ICD INSERTION CRT-D N/A 10/21/2022   Procedure: BIV ICD INSERTION CRT-D;  Surgeon: Eduardo Danelle ORN, MD;  Location: [facility] INVASIVE CV LAB;  Service: Cardiovascular;   Laterality: N/A;   CARDIAC CATHETERIZATION N/A 09/08/2016   Procedure: Left Heart Cath and Coronary Angiography;  Surgeon: Eduardo Bergamo, MD;  Location: [facility] INVASIVE CV LAB;  Service: Cardiovascular;  Laterality: N/A;   CATARACT EXTRACTION, BILATERAL  09/2017, 10/2017   Dr. Cleatus   COLONOSCOPY W/ BIOPSIES AND POLYPECTOMY     CORONARY/GRAFT ACUTE MI REVASCULARIZATION N/A 10/11/2020   Procedure: Coronary/Graft Acute MI Revascularization;  Surgeon: [name] Gordy, MD;  Location: [facility] INVASIVE CV LAB;  Service: Cardiovascular;  Laterality: N/A;   I & D EXTREMITY Right 03/11/2022   Procedure: BELOW KNEE AMPUTATION;  Surgeon: Eduardo Jerona GAILS, MD;  Location: [facility] OR;  Service: Orthopedics;  Laterality: Right;   INSERT / REPLACE / REMOVE PACEMAKER     LEFT HEART CATH N/A 12/31/2020   Procedure: Left Heart Cath;  Surgeon: [name] Gordy, MD;  Location: [facility] INVASIVE CV LAB;  Service: Cardiovascular;  Laterality: N/A;   LEFT HEART CATH AND CORONARY ANGIOGRAPHY N/A 10/11/2020   Procedure: LEFT HEART CATH AND CORONARY ANGIOGRAPHY;  Surgeon: [name] Gordy, MD;  Location: [facility] INVASIVE CV LAB;  Service: Cardiovascular;  Laterality: N/A;   LOWER EXTREMITY ANGIOGRAM Bilateral 04/18/2012   Procedure: LOWER EXTREMITY ANGIOGRAM;  Surgeon: Eduardo GORMAN Blade, MD;  Location: [facility] CATH LAB;  Service: Cardiovascular;  Laterality: Bilateral;  bilat lower extrem angio   LOWER EXTREMITY ANGIOGRAPHY Bilateral 05/02/2019  Procedure: LOWER EXTREMITY ANGIOGRAPHY;  Surgeon: Eduardo Heinz, MD;  Location: [facility] INVASIVE CV LAB;  Service: Cardiovascular;  Laterality: Bilateral;   LOWER EXTREMITY ANGIOGRAPHY Bilateral 02/28/2019   Procedure: LOWER EXTREMITY ANGIOGRAPHY;  Surgeon: Eduardo Heinz, MD;  Location: [facility] INVASIVE CV LAB;  Service: Cardiovascular;  Laterality: Bilateral;   macular photocoagulation     (eye treatments for diabetic retinopathy)-[name]   PERIPHERAL VASCULAR INTERVENTION  02/28/2019   Procedure: PERIPHERAL VASCULAR INTERVENTION;   Surgeon: Eduardo Heinz, MD;  Location: [facility] INVASIVE CV LAB;  Service: Cardiovascular;;   TEE WITHOUT CARDIOVERSION N/A 03/18/2022   Procedure: TRANSESOPHAGEAL ECHOCARDIOGRAM (TEE);  Surgeon: Eduardo Heinz, MD;  Location: [facility] ENDOSCOPY;  Service: Cardiovascular;  Laterality: N/A;   VENTRICULAR ASSIST DEVICE INSERTION N/A 12/31/2020   Procedure: VENTRICULAR ASSIST DEVICE INSERTION;  Surgeon: Eduardo Heinz, MD;  Location: [facility] INVASIVE CV LAB;  Service: Cardiovascular;  Laterality: N/A;    Family History  Problem Relation Age of Onset   Diabetes Mother    Hearing loss Mother    Hypertension Mother    Hyperlipidemia Mother    Heart disease Mother    Varicose Veins Mother    Varicose Veins Father    Dementia Father    Hyperlipidemia Brother    Diabetes Maternal Grandmother     Social History:  reports that he quit smoking about 11 years ago. His smoking use included cigarettes. He started smoking about 41 years ago. He has a 30 pack-year smoking history. He has never used smokeless tobacco. He reports that he does not currently use alcohol . He reports that he does not use drugs.  Allergies:  Allergies  Allergen Reactions   Empagliflozin      Other Reaction(s): foot infection   Shellfish-Derived Products Nausea And Vomiting and Other (See Comments)    Only Mussels cause severe nausea and vomiting    Latex Rash   Tape Rash and Other (See Comments)    Caused issues with the skin   Testosterone  Rash    did not feel well     Medications: I have reviewed the patient's current medications.  Results for orders placed or performed during the hospital encounter of 11/13/23 (from the past 48 hours)  CBC     Status: Abnormal   Collection Time: 11/13/23 10:30 PM  Result Value Ref Range   WBC 10.7 (H) 4.0 - 10.5 K/uL   RBC 5.04 4.22 - 5.81 MIL/uL   Hemoglobin 15.0 13.0 - 17.0 g/dL   HCT 53.4 60.9 - 47.9 %   MCV 92.3 80.0 - 100.0 fL   MCH 29.8 26.0 - 34.0 pg   MCHC 32.3 30.0 - 36.0 g/dL   RDW 86.7  88.4 - 84.4 %   Platelets 160 150 - 400 K/uL   nRBC 0.0 0.0 - 0.2 %    Comment: Performed at [facility] Lab, [address], [place], [place] [address]  Troponin I (High Sensitivity)     Status: Abnormal   Collection Time: 11/13/23 10:30 PM  Result Value Ref Range   Troponin I (High Sensitivity) 25 (H) <18 ng/L    Comment: (NOTE) Elevated high sensitivity troponin I (hsTnI) values and significant  changes across serial measurements may suggest ACS but many other  chronic and acute conditions are known to elevate hsTnI results.  Refer to the Links section for chest pain algorithms and additional  guidance. Performed at [facility] Lab, [address] [place], [place] [address]   Comprehensive metabolic panel  Status: Abnormal   Collection Time: 11/13/23 10:30 PM  Result Value Ref Range   Sodium 138 135 - 145 mmol/L   Potassium 4.2 3.5 - 5.1 mmol/L   Chloride 100 98 - 111 mmol/L   CO2 25 22 - 32 mmol/L   Glucose, Bld 253 (H) 70 - 99 mg/dL    Comment: Glucose reference range applies only to samples taken after fasting for at least 8 hours.   BUN 37 (H) 8 - 23 mg/dL   Creatinine, Ser 8.37 (H) 0.61 - 1.24 mg/dL   Calcium  9.5 8.9 - 10.3 mg/dL   Total Protein 7.3 6.5 - 8.1 g/dL   Albumin  4.0 3.5 - 5.0 g/dL   AST 34 15 - 41 U/L   ALT 30 0 - 44 U/L   Alkaline Phosphatase 77 38 - 126 U/L   Total Bilirubin 1.5 (H) 0.0 - 1.2 mg/dL   GFR, Estimated 46 (L) >60 mL/min    Comment: (NOTE) Calculated using the CKD-EPI Creatinine Equation (2021)    Anion gap 13 5 - 15    Comment: Performed at [facility] Lab, [address] [place], [place] [address]  Lipase, blood     Status: None   Collection Time: 11/13/23 10:30 PM  Result Value Ref Range   Lipase 27 11 - 51 U/L    Comment: Performed at [facility] Lab, [address], [place], [place] [address]  CBG monitoring, ED     Status: Abnormal   Collection Time: 11/13/23 10:31 PM  Result Value Ref Range    Glucose-Capillary 253 (H) 70 - 99 mg/dL    Comment: Glucose reference range applies only to samples taken after fasting for at least 8 hours.  I-stat chem 8, ed     Status: Abnormal   Collection Time: 11/13/23 10:41 PM  Result Value Ref Range   Sodium 139 135 - 145 mmol/L   Potassium 4.2 3.5 - 5.1 mmol/L   Chloride 101 98 - 111 mmol/L   BUN 37 (H) 8 - 23 mg/dL   Creatinine, Ser 8.49 (H) 0.61 - 1.24 mg/dL   Glucose, Bld 756 (H) 70 - 99 mg/dL    Comment: Glucose reference range applies only to samples taken after fasting for at least 8 hours.   Calcium , Ion 1.14 (L) 1.15 - 1.40 mmol/L   TCO2 25 22 - 32 mmol/L   Hemoglobin 16.0 13.0 - 17.0 g/dL   HCT 52.9 60.9 - 47.9 %  Troponin I (High Sensitivity)     Status: Abnormal   Collection Time: 11/14/23 12:35 AM  Result Value Ref Range   Troponin I (High Sensitivity) 24 (H) <18 ng/L    Comment: (NOTE) Elevated high sensitivity troponin I (hsTnI) values and significant  changes across serial measurements may suggest ACS but many other  chronic and acute conditions are known to elevate hsTnI results.  Refer to the Links section for chest pain algorithms and additional  guidance. Performed at [facility] Lab, [address] [place], [place] [address]   Resp panel by RT-PCR (RSV, Flu A&B, Covid) Anterior Nasal Swab     Status: None   Collection Time: 11/14/23  2:17 AM   Specimen: Anterior Nasal Swab  Result Value Ref Range   SARS Coronavirus 2 by RT PCR NEGATIVE NEGATIVE   Influenza A by PCR NEGATIVE NEGATIVE   Influenza B by PCR NEGATIVE NEGATIVE    Comment: (NOTE) The Xpert Xpress SARS-CoV-2/FLU/RSV plus assay is intended as  an aid in the diagnosis of influenza from Nasopharyngeal swab specimens and should not be used as a sole basis for treatment. Nasal washings and aspirates are unacceptable for Xpert Xpress SARS-CoV-2/FLU/RSV testing.  Fact Sheet for Patients: bloggercourse.com  Fact Sheet for  Healthcare Providers: seriousbroker.it  This test is not yet approved or cleared by the [place]  FDA and has been authorized for detection and/or diagnosis of SARS-CoV-2 by FDA under an Emergency Use Authorization (EUA). This EUA will remain in effect (meaning this test can be used) for the duration of the COVID-19 declaration under Section 564(b)(1) of the Act, 21 U.S.C. section 360bbb-3(b)(1), unless the authorization is terminated or revoked.     Resp Syncytial Virus by PCR NEGATIVE NEGATIVE    Comment: (NOTE) Fact Sheet for Patients: bloggercourse.com  Fact Sheet for Healthcare Providers: seriousbroker.it  This test is not yet approved or cleared by the [place]  FDA and has been authorized for detection and/or diagnosis of SARS-CoV-2 by FDA under an Emergency Use Authorization (EUA). This EUA will remain in effect (meaning this test can be used) for the duration of the COVID-19 declaration under Section 564(b)(1) of the Act, 21 U.S.C. section 360bbb-3(b)(1), unless the authorization is terminated or revoked.  Performed at [facility], [address], [place], [place] [address]   CBG monitoring, ED     Status: Abnormal   Collection Time: 11/14/23  2:24 AM  Result Value Ref Range   Glucose-Capillary 242 (H) 70 - 99 mg/dL    Comment: Glucose reference range applies only to samples taken after fasting for at least 8 hours.  Brain natriuretic peptide     Status: Abnormal   Collection Time: 11/14/23  2:29 AM  Result Value Ref Range   B Natriuretic Peptide 1,274.6 (H) 0.0 - 100.0 pg/mL    Comment: Performed at [facility] Lab, [address], [place], [place] [address]    DG Chest Portable 1 View Result Date: 11/14/2023 CLINICAL DATA:  Nausea EXAM: PORTABLE CHEST 1 VIEW COMPARISON:  10/21/2022 FINDINGS: Stable cardiomediastinal silhouette. Left chest wall CRT D. layering bilateral  pleural effusions and associated airspace opacities. Pulmonary vascular congestion. No pneumothorax. IMPRESSION: Layering bilateral pleural effusions and associated atelectasis or pneumonia. Electronically Signed   By: Norman Gatlin M.D.   On: 11/14/2023 00:58    Review of Systems  Constitutional: Negative.   HENT: Negative.    Eyes: Negative.   Respiratory: Negative.    Cardiovascular: Negative.   Gastrointestinal:  Positive for abdominal pain, nausea and vomiting.  Genitourinary: Negative.   Musculoskeletal: Negative.   Skin: Negative.   Neurological: Negative.   Endo/Heme/Allergies: Negative.   Psychiatric/Behavioral: Negative.      PE Blood pressure 115/89, pulse 92, temperature 97.7 F (36.5 C), temperature source Oral, resp. rate 19, height 5' 9 (1.753 m), weight 103.7 kg, SpO2 93%. Constitutional: NAD; conversant; no deformities Eyes: Moist conjunctiva; no lid lag; anicteric; PERRL Neck: Trachea midline; no thyromegaly Lungs: Normal respiratory effort; no tactile fremitus CV: RRR; no palpable thrills; no pitting edema GI: Abd soft, slight pain over right palpation; no palpable hepatosplenomegaly MSK: R BKA; no clubbing/cyanosis Psychiatric: Appropriate affect; alert and oriented x3 Lymphatic: No palpable cervical or axillary lymphadenopathy Skin: No major subcutaneous nodules. Warm and dry   Assessment/Plan: 68 yo male with concern for closed loop obstruction -OR for diagnostic lap, possible open incision, possible bowel resection  I reviewed last 24 h vitals and pain scores, last 48 h intake and output,  last 24 h labs and trends, and last 24 h imaging results.  This care required high  level of medical decision making.   Herlene Righter Taylia Berber 11/14/2023, 3:59 AM

## 2023-11-14 NOTE — Transfer of Care (Signed)
 Immediate Anesthesia Transfer of Care Note  Patient: CLESTER CHLEBOWSKI  Procedure(s) Performed: LAPAROSCOPY DIAGNOSTIC (Abdomen) EXPLORATORY LAPAROTOMY (Abdomen) SMALL BOWEL RESECTION (Abdomen)  Patient Location: PACU  Anesthesia Type:General  Level of Consciousness: awake, drowsy, patient cooperative, and responds to stimulation  Airway & Oxygen Therapy: Patient Spontanous Breathing and Patient connected to nasal cannula oxygen  Post-op Assessment: Report given to RN and Post -op Vital signs reviewed and stable  Post vital signs: Reviewed and stable  Last Vitals:  Vitals Value Taken Time  BP 116/51 11/14/23 0620  Temp 36.7   Pulse 94 11/14/23 0625  Resp 10 11/14/23 0625  SpO2 99 % 11/14/23 0625  Vitals shown include unfiled device data.  Pt states no pain   Complications: No notable events documented.

## 2023-11-14 NOTE — ED Notes (Signed)
 Report given to OR.

## 2023-11-14 NOTE — ED Notes (Addendum)
 Patient stated zofran was not affective in managing nausea, Tanda Rockers A, DO notified.

## 2023-11-14 NOTE — ED Notes (Signed)
 Patient returned from CT

## 2023-11-14 NOTE — Plan of Care (Signed)

## 2023-11-14 NOTE — ED Notes (Addendum)
 Patient transported to CT

## 2023-11-14 NOTE — Anesthesia Procedure Notes (Signed)
 Procedure Name: Intubation Date/Time: 11/14/2023 4:57 AM  Performed by: Oley Aleck LABOR, CRNAPre-anesthesia Checklist: Patient identified, Emergency Drugs available, Suction available and Patient being monitored Patient Re-evaluated:Patient Re-evaluated prior to induction Oxygen Delivery Method: Circle system utilized Preoxygenation: Pre-oxygenation with 100% oxygen Induction Type: IV induction, Rapid sequence and Cricoid Pressure applied Laryngoscope Size: Mac and 4 Grade View: Grade IV Tube type: Oral Tube size: 7.5 mm Number of attempts: 1 Airway Equipment and Method: Stylet and Bougie stylet Placement Confirmation: ETT inserted through vocal cords under direct vision, positive ETCO2 and breath sounds checked- equal and bilateral Tube secured with: Tape Dental Injury: Teeth and Oropharynx as per pre-operative assessment  Comments: Recommend video laryngoscopy; mask airway not attempted; grade 4, bougie placed blind posterior to epiglottis, rings not felt; ebbs

## 2023-11-14 NOTE — ED Notes (Signed)
Transporting to PACU

## 2023-11-14 NOTE — Op Note (Signed)
 Preoperative diagnosis: closed loop bowel obstruction  Postoperative diagnosis: same   Procedure: diagnostic laparoscopy, small bowel resection with anastomosis  Surgeon: Herlene Bureau, M.D.  Asst: none  Anesthesia: GETA  Indications for procedure: Eduardo [name] is a 68 y.o. year old male with symptoms of nausea, vomiting, and abdominal pain.  Description of procedure: The patient was brought into the operative suite. Anesthesia was administered with General endotracheal anesthesia. WHO checklist was applied. The patient was then placed in supine position. The area was prepped and draped in the usual sterile fashion.  A left subcostal incision was made. A 5mm trocar was used to gain access to the peritoneal cavity by optical entry technique. Pneumoperitoneum was applied with a high flow and low pressure. The laparoscope was reinserted to confirm position.  Upon examination of the peritoneal contents there is a large amount of bloody fluid and 3 loops of very red bowel.  I did not think that it could be managed laparoscopically therefore proceeded with open procedure.  Midline incision was made.  Cautery was used to dissect down through subcutaneous tissues and the fascia was entered in the midline.  Upon entry of the abdomen there were about 3 feet of intestine that was very red and inflamed and dilated.  There is a adhesion of omentum over the proximal area that seem to have caused a band in this space.  The distal margin was less distinct and there did not appear to be a physical band in the space.  Decision was made to resect this piece of intestine.  To 75 mm GIA's were used to divide the intestine at the proximal and distal edges of the red inflamed intestine.  LigaSure was used to divide the mesentery.  There was 1 area that had to be reinforced with 2-0 silk sutures for hemostasis.  Anastomosis was made as a side-to-side functional end and using a 75 mm blue load GIA stapler.  The  enterotomy was closed with a 60 mm blue load TA stapler.  Edges were imbricated with 3-0 silk.  Antitension stitch was placed with 3-0 silk.  The abdomen was irrigated.  The remainder of the bowel looked fine.  There were 3 areas with a 8 cm adhesion in a U fashion that seemed odd given he had no previous surgeries.  Fascia was closed with 0 PDS in running fashion.  Incision was closed with staples.  Dressing was put in place.  Patient woke from anesthesia brought to PACU in stable condition.  All counts were correct.  Findings: 3 feet of intestine with engorgement and severe inflammatory changes  Specimen: small intestine  Implant: none   Blood loss: 100 ml  Local anesthesia: none  Complications: none  Herlene Bureau, M.D. General, Bariatric, & Minimally Invasive Surgery [facility]

## 2023-11-15 ENCOUNTER — Encounter (HOSPITAL_COMMUNITY): Payer: Self-pay | Admitting: General Surgery

## 2023-11-15 DIAGNOSIS — K56609 Unspecified intestinal obstruction, unspecified as to partial versus complete obstruction: Secondary | ICD-10-CM | POA: Diagnosis not present

## 2023-11-15 LAB — BASIC METABOLIC PANEL
Anion gap: 12 (ref 5–15)
BUN: 39 mg/dL — ABNORMAL HIGH (ref 8–23)
CO2: 24 mmol/L (ref 22–32)
Calcium: 8.5 mg/dL — ABNORMAL LOW (ref 8.9–10.3)
Chloride: 104 mmol/L (ref 98–111)
Creatinine, Ser: 1.73 mg/dL — ABNORMAL HIGH (ref 0.61–1.24)
GFR, Estimated: 42 mL/min — ABNORMAL LOW (ref >60)
Glucose, Bld: 195 mg/dL — ABNORMAL HIGH (ref 70–99)
Potassium: 4.6 mmol/L (ref 3.5–5.1)
Sodium: 140 mmol/L (ref 135–145)

## 2023-11-15 LAB — URINALYSIS, ROUTINE W REFLEX MICROSCOPIC
Bacteria, UA: NONE SEEN
Bilirubin Urine: NEGATIVE
Glucose, UA: NEGATIVE mg/dL
Ketones, ur: 5 mg/dL — AB
Nitrite: NEGATIVE
Protein, ur: 30 mg/dL — AB
Specific Gravity, Urine: 1.026 (ref 1.005–1.030)
pH: 5 (ref 5.0–8.0)

## 2023-11-15 LAB — CBC
HCT: 34.3 % — ABNORMAL LOW (ref 39.0–52.0)
Hemoglobin: 11 g/dL — ABNORMAL LOW (ref 13.0–17.0)
MCH: 29.7 pg (ref 26.0–34.0)
MCHC: 32.1 g/dL (ref 30.0–36.0)
MCV: 92.7 fL (ref 80.0–100.0)
Platelets: 139 10*3/uL — ABNORMAL LOW (ref 150–400)
RBC: 3.7 MIL/uL — ABNORMAL LOW (ref 4.22–5.81)
RDW: 13.6 % (ref 11.5–15.5)
WBC: 8.4 10*3/uL (ref 4.0–10.5)
nRBC: 0 % (ref 0.0–0.2)

## 2023-11-15 LAB — LACTIC ACID, PLASMA: Lactic Acid, Venous: 1 mmol/L (ref 0.5–1.9)

## 2023-11-15 LAB — GLUCOSE, CAPILLARY
Glucose-Capillary: 229 mg/dL — ABNORMAL HIGH (ref 70–99)
Glucose-Capillary: 167 mg/dL — ABNORMAL HIGH (ref 70–99)

## 2023-11-15 MED ORDER — SODIUM CHLORIDE 0.9 % IV SOLN: INTRAVENOUS | Status: AC

## 2023-11-15 MED ORDER — ACETAMINOPHEN 10 MG/ML IV SOLN
1000.0000 mg | Freq: Four times a day (QID) | INTRAVENOUS | Status: AC
  Administered 2023-11-15 – 2023-11-16 (×4): 1000 mg via INTRAVENOUS
  Filled 2023-11-15 (×4): qty 100

## 2023-11-15 MED ORDER — METHOCARBAMOL 1000 MG/10ML IJ SOLN: 500.0000 mg | Freq: Three times a day (TID) | INTRAMUSCULAR | Status: DC | PRN

## 2023-11-15 MED ORDER — MORPHINE SULFATE (PF) 2 MG/ML IV SOLN: 2.0000 mg | INTRAVENOUS | Status: DC | PRN

## 2023-11-15 MED ORDER — INSULIN ASPART 100 UNIT/ML IJ SOLN
0.0000 [IU] | INTRAMUSCULAR | Status: DC
  Administered 2023-11-15: 2 [IU] via SUBCUTANEOUS

## 2023-11-15 MED ORDER — INSULIN ASPART 100 UNIT/ML IJ SOLN
0.0000 [IU] | INTRAMUSCULAR | Status: DC
  Administered 2023-11-15: 2 [IU] via SUBCUTANEOUS
  Administered 2023-11-16: 3 [IU] via SUBCUTANEOUS
  Administered 2023-11-16 (×3): 2 [IU] via SUBCUTANEOUS
  Administered 2023-11-16: 3 [IU] via SUBCUTANEOUS
  Administered 2023-11-17 (×3): 2 [IU] via SUBCUTANEOUS
  Administered 2023-11-17 (×2): 3 [IU] via SUBCUTANEOUS
  Administered 2023-11-17: 2 [IU] via SUBCUTANEOUS
  Administered 2023-11-18: 3 [IU] via SUBCUTANEOUS
  Administered 2023-11-18 (×2): 2 [IU] via SUBCUTANEOUS

## 2023-11-15 NOTE — Progress Notes (Signed)
 PROGRESS NOTE    OTHMAN MASUR  FMW:[id number] DOB: September 01, 1956 DOA: 11/13/2023 PCP: [name], MD  67/M with CAD, multiple PCI, not candidate for CABG, ischemic cardiomyopathy, EF 30%, ICD, CKD 3 AAA, hypertension, type 2 diabetes mellitus, right BKA, PAD presented to the ED 1/4 with nausea vomiting and abdominal pain, admitted 1/5, with closed-loop small bowel obstruction, urgent surgery, underwent small bowel resection with anastomosis 1/5   Subjective: -Feels miserable, wants NG tube out, denies chest pain or dyspnea  Assessment and Plan:  SBO, closed-loop obstruction -Acute onset, status post diagnostic laparoscopy, small bowel resection with anastomosis 1/5 -On NG tube decompression awaiting return of bowel function IV fluids today -Increase mobility, pain control -Per surgery   CKD3a -Stable,, monitor   DM2, well controlled -Recent A1c within normal limits, no indication to repeat -Sliding scale insulin , D5 discontinued   CAD, obstructive s/p multiple PCI -Currently unable to resume home meds given n.p.o. status  - Plavix , Zetia , gemfibrozil , Crestor , Entresto , torsemide  currently on hold   HTN -Holding torsemide , Entresto  as above   Chronic systolic CHF, AICD Moderate to severe MR -Last echo 11/24 with EF 30%, normal RV, moderately elevated PASP, moderate to severe MR -Currently with SBO, n.p.o., continue IV fluids today -Entresto , diuretics on hold   Prior history of osteo/BKA(R) PAD -Stable, PT OT as appropriate postoperatively   DVT prophylaxis: Lovenox  Code Status: Full Family Communication: None present at Disposition Plan: Home pending improvement  Consultants:    Procedures:   Antimicrobials:    Objective: Vitals:   11/15/23 0400 11/15/23 0806 11/15/23 0945 11/15/23 0947  BP: 95/62 102/61 100/66 101/61  Pulse: 96 94 (!) 102 (!) 101  Resp: 19 20 20 19   Temp: 97.7 F (36.5 C) 98.1 F (36.7 C)  98.1 F (36.7 C)  TempSrc: Oral Oral    SpO2:  94% 95% 94% 96%  Weight:      Height:        Intake/Output Summary (Last 24 hours) at 11/15/2023 1017 Last data filed at 11/15/2023 0510 Gross per 24 hour  Intake 352 ml  Output 1775 ml  Net -1423 ml   Filed Weights   11/13/23 2221  Weight: 103.7 kg    Examination:  General exam: Obese male sitting up in bed, AAOx3 HEENT: No JVD CVS: S1-S2, regular rhythm, systolic murmur Lungs: Decreased breath sounds at the bases  abdomen: Mildly distended, tender as expected, surgical incision, bowel sounds absent Extremities: no edema Skin: No rashes Psychiatry:  Mood & affect appropriate.     Data Reviewed:   CBC: Recent Labs  Lab 11/13/23 2230 11/13/23 2241 11/14/23 0812 11/14/23 2208 11/15/23 0108  WBC 10.7*  --  7.8 8.3 8.4  HGB 15.0 16.0 12.3* 10.7* 11.0*  HCT 46.5 47.0 38.2* 32.6* 34.3*  MCV 92.3  --  93.2 91.8 92.7  PLT 160  --  156 135* 139*   Basic Metabolic Panel: Recent Labs  Lab 11/13/23 2230 11/13/23 2241 11/14/23 0812 11/15/23 0108  NA 138 139  --  140  K 4.2 4.2  --  4.6  CL 100 101  --  104  CO2 25  --   --  24  GLUCOSE 253* 243*  --  195*  BUN 37* 37*  --  39*  CREATININE 1.62* 1.50* 1.53* 1.73*  CALCIUM  9.5  --   --  8.5*   GFR: Estimated Creatinine Clearance: 48.5 mL/min (A) (by C-G formula based on SCr of 1.73 mg/dL (H)). Liver  Function Tests: Recent Labs  Lab 11/13/23 2230  AST 34  ALT 30  ALKPHOS 77  BILITOT 1.5*  PROT 7.3  ALBUMIN  4.0   Recent Labs  Lab 11/13/23 2230  LIPASE 27   No results for input(s): AMMONIA in the last 168 hours. Coagulation Profile: No results for input(s): INR, PROTIME in the last 168 hours. Cardiac Enzymes: No results for input(s): CKTOTAL, CKMB, CKMBINDEX, TROPONINI in the last 168 hours. BNP (last 3 results) No results for input(s): PROBNP in the last 8760 hours. HbA1C: No results for input(s): HGBA1C in the last 72 hours. CBG: Recent Labs  Lab 11/13/23 2231 11/14/23 0224  11/14/23 0432 11/14/23 0624 11/14/23 0716  GLUCAP 253* 242* 229* 211* 220*   Lipid Profile: No results for input(s): CHOL, HDL, LDLCALC, TRIG, CHOLHDL, LDLDIRECT in the last 72 hours. Thyroid  Function Tests: No results for input(s): TSH, T4TOTAL, FREET4, T3FREE, THYROIDAB in the last 72 hours. Anemia Panel: No results for input(s): VITAMINB12, FOLATE, FERRITIN, TIBC, IRON , RETICCTPCT in the last 72 hours. Urine analysis:    Component Value Date/Time   COLORURINE YELLOW 11/15/2023 0512   APPEARANCEUR HAZY (A) 11/15/2023 0512   LABSPEC 1.026 11/15/2023 0512   PHURINE 5.0 11/15/2023 0512   GLUCOSEU NEGATIVE 11/15/2023 0512   HGBUR SMALL (A) 11/15/2023 0512   BILIRUBINUR NEGATIVE 11/15/2023 0512   BILIRUBINUR negative 06/26/2021 1300   BILIRUBINUR NEG 10/13/2012 0925   KETONESUR 5 (A) 11/15/2023 0512   PROTEINUR 30 (A) 11/15/2023 0512   UROBILINOGEN 0.2 06/26/2021 1300   NITRITE NEGATIVE 11/15/2023 0512   LEUKOCYTESUR SMALL (A) 11/15/2023 0512   Sepsis Labs: @LABRCNTIP (procalcitonin:4,lacticidven:4)  ) Recent Results (from the past 240 hours)  Resp panel by RT-PCR (RSV, Flu A&B, Covid) Anterior Nasal Swab     Status: None   Collection Time: 11/14/23  2:17 AM   Specimen: Anterior Nasal Swab  Result Value Ref Range Status   SARS Coronavirus 2 by RT PCR NEGATIVE NEGATIVE Final   Influenza A by PCR NEGATIVE NEGATIVE Final   Influenza B by PCR NEGATIVE NEGATIVE Final    Comment: (NOTE) The Xpert Xpress SARS-CoV-2/FLU/RSV plus assay is intended as an aid in the diagnosis of influenza from Nasopharyngeal swab specimens and should not be used as a sole basis for treatment. Nasal washings and aspirates are unacceptable for Xpert Xpress SARS-CoV-2/FLU/RSV testing.  Fact Sheet for Patients: bloggercourse.com  Fact Sheet for Healthcare Providers: seriousbroker.it  This test is not yet approved or  cleared by the [place]  FDA and has been authorized for detection and/or diagnosis of SARS-CoV-2 by FDA under an Emergency Use Authorization (EUA). This EUA will remain in effect (meaning this test can be used) for the duration of the COVID-19 declaration under Section 564(b)(1) of the Act, 21 U.S.C. section 360bbb-3(b)(1), unless the authorization is terminated or revoked.     Resp Syncytial Virus by PCR NEGATIVE NEGATIVE Final    Comment: (NOTE) Fact Sheet for Patients: bloggercourse.com  Fact Sheet for Healthcare Providers: seriousbroker.it  This test is not yet approved or cleared by the [place]  FDA and has been authorized for detection and/or diagnosis of SARS-CoV-2 by FDA under an Emergency Use Authorization (EUA). This EUA will remain in effect (meaning this test can be used) for the duration of the COVID-19 declaration under Section 564(b)(1) of the Act, 21 U.S.C. section 360bbb-3(b)(1), unless the authorization is terminated or revoked.  Performed at [facility], [address] [place], [place] [address]  Radiology Studies: CT ABDOMEN PELVIS W CONTRAST Result Date: 11/14/2023 CLINICAL DATA:  Periumbilical abdominal pain with nausea and vomiting EXAM: CT ABDOMEN AND PELVIS WITH CONTRAST TECHNIQUE: Multidetector CT imaging of the abdomen and pelvis was performed using the standard protocol following bolus administration of intravenous contrast. RADIATION DOSE REDUCTION: This exam was performed according to the departmental dose-optimization program which includes automated exposure control, adjustment of the mA and/or kV according to patient size and/or use of iterative reconstruction technique. CONTRAST:  75mL OMNIPAQUE  IOHEXOL  350 MG/ML SOLN COMPARISON:  None Available. FINDINGS: Lower chest: Small bilateral pleural effusions. Interlobular septal thickening and patchy ground-glass opacities in the lower  lungs. Dilated left ventricle. Hepatobiliary: 8.8 cm hypoattenuating lesion in the posterior right hepatic lobe. There is suggestion of nodular peripheral enhancement which would be compatible with a benign hemangioma however further evaluation with hepatic protocol MRI of the abdomen is recommended. No biliary dilation. Pancreas: Unremarkable. Spleen: Unremarkable. Adrenals/Urinary Tract: Normal adrenal glands. No urinary calculi or hydronephrosis. Unremarkable bladder. Stomach/Bowel: 2 adjacent transition points in the central mesentery. The small bowel is dilated upstream from the first transition point (series 3/image 46). The small bowel in between the 2 transition points is poorly enhancing with wall thickening and marked interloop fluid and stranding concerning for ischemia. The second transition point is anterior to the first (series 3/image 48) findings are compatible with ischemic bowel due to closed loop obstruction. The small bowel downstream from the obstruction is normal caliber. Normal caliber colon without wall thickening. Colonic diverticulosis without diverticulitis. Stomach is within normal limits. Normal appendix. Vascular/Lymphatic: Advanced aortic atherosclerotic calcification. No lymphadenopathy. Reproductive: Enlarged prostate. Other: No free intraperitoneal air. Musculoskeletal: No acute fracture. IMPRESSION: 1. Closed loop small bowel obstruction with ischemic bowel in between the 2 transition points in the central mesentery. Emergent surgical consult is recommended. 2. 8.8 cm hypoattenuating lesion in the posterior right hepatic lobe. There is suggestion of nodular peripheral enhancement which would be compatible with a benign hemangioma. Correlation with prior imaging if available or more definitive evaluation with hepatic protocol MRI is recommended. 3. Small bilateral pleural effusions. Interlobular septal thickening and patchy ground-glass opacities in the lower lungs compatible with  pulmonary edema. Aortic Atherosclerosis (ICD10-I70.0). Critical Value/emergent results were called by telephone at the time of interpretation on 11/14/2023 at 3:16 am to provider JAYSON PEREYRA , who verbally acknowledged these results. Electronically Signed   By: Norman Gatlin M.D.   On: 11/14/2023 03:17   DG Chest Portable 1 View Result Date: 11/14/2023 CLINICAL DATA:  Nausea EXAM: PORTABLE CHEST 1 VIEW COMPARISON:  10/21/2022 FINDINGS: Stable cardiomediastinal silhouette. Left chest wall CRT D. layering bilateral pleural effusions and associated airspace opacities. Pulmonary vascular congestion. No pneumothorax. IMPRESSION: Layering bilateral pleural effusions and associated atelectasis or pneumonia. Electronically Signed   By: Norman Gatlin M.D.   On: 11/14/2023 00:58     Scheduled Meds:  enoxaparin  (LOVENOX ) injection  40 mg Subcutaneous Daily   insulin  aspart  0-15 Units Subcutaneous TID WC   insulin  aspart  0-5 Units Subcutaneous QHS   Continuous Infusions:  acetaminophen        LOS: 1 day    Time spent:    Sigurd Pac, MD [facility]   11/15/2023, 10:17 AM

## 2023-11-15 NOTE — Progress Notes (Addendum)
 1 Day Post-Op  Subjective: Patient doing well so far.  Foley removed this morning.  Waiting to void.  Pain well controlled, but hasn't mobilized yet.  Has a prothesis for his RLE.  Small flatus, but not any significant bowel function  ROS: See above, otherwise other systems negative  Objective: Vital signs in last 24 hours: Temp:  [97.7 F (36.5 C)-98.9 F (37.2 C)] 98.1 F (36.7 C) (01/06 0806) Pulse Rate:  [84-96] 94 (01/06 0806) Resp:  [14-20] 20 (01/06 0806) BP: (77-102)/(44-62) 102/61 (01/06 0806) SpO2:  [92 %-96 %] 95 % (01/06 0806) Last BM Date :  (PTA)  Intake/Output from previous day: 01/05 0701 - 01/06 0700 In: 352 [I.V.:322; NG/GT:30] Out: 1975 [Urine:900; Emesis/NG output:1075] Intake/Output this shift: No intake/output data recorded.  PE: Gen: NAD Abd: soft, appropriately tender, midline incision c/I, some old bloody drainage noted on honeycomb dressing.  Nothing acute.  Lap site on L lateral side is clean and covered.  NGT in place with some bilious output, 1L documented since yesterday.  Lab Results:  Recent Labs    11/14/23 2208 11/15/23 0108  WBC 8.3 8.4  HGB 10.7* 11.0*  HCT 32.6* 34.3*  PLT 135* 139*   BMET Recent Labs    11/13/23 2230 11/13/23 2241 11/14/23 0812 11/15/23 0108  NA 138 139  --  140  K 4.2 4.2  --  4.6  CL 100 101  --  104  CO2 25  --   --  24  GLUCOSE 253* 243*  --  195*  BUN 37* 37*  --  39*  CREATININE 1.62* 1.50* 1.53* 1.73*  CALCIUM  9.5  --   --  8.5*   PT/INR No results for input(s): LABPROT, INR in the last 72 hours. CMP     Component Value Date/Time   NA 140 11/15/2023 0108   NA 144 10/19/2023 0000   K 4.6 11/15/2023 0108   CL 104 11/15/2023 0108   CO2 24 11/15/2023 0108   GLUCOSE 195 (H) 11/15/2023 0108   BUN 39 (H) 11/15/2023 0108   BUN 48 (H) 03/04/2023 1236   CREATININE 1.73 (H) 11/15/2023 0108   CREATININE 1.15 09/28/2011 1358   CALCIUM  8.5 (L) 11/15/2023 0108   PROT 7.3 11/13/2023 2230    PROT 6.3 04/08/2022 1326   ALBUMIN  4.0 11/13/2023 2230   ALBUMIN  3.6 (L) 04/08/2022 1326   AST 34 11/13/2023 2230   ALT 30 11/13/2023 2230   ALKPHOS 77 11/13/2023 2230   BILITOT 1.5 (H) 11/13/2023 2230   BILITOT 0.6 04/08/2022 1326   GFRNONAA 42 (L) 11/15/2023 0108   GFRAA 44 (L) 12/19/2020 1031   Lipase     Component Value Date/Time   LIPASE 27 11/13/2023 2230       Studies/Results: CT ABDOMEN PELVIS W CONTRAST Result Date: 11/14/2023 CLINICAL DATA:  Periumbilical abdominal pain with nausea and vomiting EXAM: CT ABDOMEN AND PELVIS WITH CONTRAST TECHNIQUE: Multidetector CT imaging of the abdomen and pelvis was performed using the standard protocol following bolus administration of intravenous contrast. RADIATION DOSE REDUCTION: This exam was performed according to the departmental dose-optimization program which includes automated exposure control, adjustment of the mA and/or kV according to patient size and/or use of iterative reconstruction technique. CONTRAST:  75mL OMNIPAQUE  IOHEXOL  350 MG/ML SOLN COMPARISON:  None Available. FINDINGS: Lower chest: Small bilateral pleural effusions. Interlobular septal thickening and patchy ground-glass opacities in the lower lungs. Dilated left ventricle. Hepatobiliary: 8.8 cm hypoattenuating lesion in the posterior right  hepatic lobe. There is suggestion of nodular peripheral enhancement which would be compatible with a benign hemangioma however further evaluation with hepatic protocol MRI of the abdomen is recommended. No biliary dilation. Pancreas: Unremarkable. Spleen: Unremarkable. Adrenals/Urinary Tract: Normal adrenal glands. No urinary calculi or hydronephrosis. Unremarkable bladder. Stomach/Bowel: 2 adjacent transition points in the central mesentery. The small bowel is dilated upstream from the first transition point (series 3/image 46). The small bowel in between the 2 transition points is poorly enhancing with wall thickening and marked  interloop fluid and stranding concerning for ischemia. The second transition point is anterior to the first (series 3/image 48) findings are compatible with ischemic bowel due to closed loop obstruction. The small bowel downstream from the obstruction is normal caliber. Normal caliber colon without wall thickening. Colonic diverticulosis without diverticulitis. Stomach is within normal limits. Normal appendix. Vascular/Lymphatic: Advanced aortic atherosclerotic calcification. No lymphadenopathy. Reproductive: Enlarged prostate. Other: No free intraperitoneal air. Musculoskeletal: No acute fracture. IMPRESSION: 1. Closed loop small bowel obstruction with ischemic bowel in between the 2 transition points in the central mesentery. Emergent surgical consult is recommended. 2. 8.8 cm hypoattenuating lesion in the posterior right hepatic lobe. There is suggestion of nodular peripheral enhancement which would be compatible with a benign hemangioma. Correlation with prior imaging if available or more definitive evaluation with hepatic protocol MRI is recommended. 3. Small bilateral pleural effusions. Interlobular septal thickening and patchy ground-glass opacities in the lower lungs compatible with pulmonary edema. Aortic Atherosclerosis (ICD10-I70.0). Critical Value/emergent results were called by telephone at the time of interpretation on 11/14/2023 at 3:16 am to provider JAYSON PEREYRA , who verbally acknowledged these results. Electronically Signed   By: Norman Gatlin M.D.   On: 11/14/2023 03:17   DG Chest Portable 1 View Result Date: 11/14/2023 CLINICAL DATA:  Nausea EXAM: PORTABLE CHEST 1 VIEW COMPARISON:  10/21/2022 FINDINGS: Stable cardiomediastinal silhouette. Left chest wall CRT D. layering bilateral pleural effusions and associated airspace opacities. Pulmonary vascular congestion. No pneumothorax. IMPRESSION: Layering bilateral pleural effusions and associated atelectasis or pneumonia. Electronically Signed   By:  Norman Gatlin M.D.   On: 11/14/2023 00:58    Anti-infectives: Anti-infectives (From admission, onward)    Start     Dose/Rate Route Frequency Ordered Stop   11/14/23 0330  piperacillin -tazobactam (ZOSYN ) IVPB 3.375 g        3.375 g 100 mL/hr over 30 Minutes Intravenous  Once 11/14/23 0326 11/14/23 0757   11/14/23 0115  cefTRIAXone  (ROCEPHIN ) 2 g in sodium chloride  0.9 % 100 mL IVPB        2 g 200 mL/hr over 30 Minutes Intravenous  Once 11/14/23 0105 11/14/23 0243        Assessment/Plan POD 1, s/p ex lap with SBR for closed loop SBO, Dr. Stevie 1/5 -cont NGT and await bowel function -PT eval for mobilization -ambulate TID, OOB into a chair -multi-modal pain control -foley out, awaiting spontaneous voiding -no abx needed -labs reviewed and are stable  FEN - NPO/NGT/IVFs VTE - Lovenox  ID - none currently needed  DM CHF CKD CAD HTN PAD    LOS: 1 day    Burnard FORBES Banter , [facility] 11/15/2023, 9:42 AM Please see Amion for pager number during day hours 7:00am-4:30pm or 7:00am -11:30am on weekends

## 2023-11-15 NOTE — Plan of Care (Signed)
  Problem: Clinical Measurements: Goal: Diagnostic test results will improve Outcome: Progressing   Problem: Clinical Measurements: Goal: Will remain free from infection Outcome: Progressing   Problem: Clinical Measurements: Goal: Ability to maintain clinical measurements within normal limits will improve Outcome: Progressing   Problem: Clinical Measurements: Goal: Respiratory complications will improve Outcome: Progressing   Problem: Coping: Goal: Level of anxiety will decrease Outcome: Progressing   Problem: Elimination: Goal: Will not experience complications related to bowel motility Outcome: Progressing Goal: Will not experience complications related to urinary retention Outcome: Progressing

## 2023-11-15 NOTE — Progress Notes (Addendum)
 Patient was unable to void after foley out this morning, bladder scan showed of urine volume so In and out cath done as per the order Patient was having few drops of bleeding from his urethra when he tried to urinate. of bloody urine out during in and out catheter and there was small clots as well.   After the In and out as well patient was unable to void and he refused any tenderness over bladder, no any bleeding from his urethra after in and out cath. MD aware

## 2023-11-15 NOTE — Evaluation (Signed)
 Physical Therapy Evaluation/ Discharge Patient Details Name: Eduardo [name] MRN: [id number] DOB: 11-20-1955 Today's Date: 11/15/2023  History of Present Illness  68 yo male admitted 1/4 with vomiting, abdominal pain and SBO. 11/14/23 small bowel resection. PMhx: CAD, ischemic cardiomyopathy, EF 30%, ICD, CKD, HTN, T2DM, Rt BKA, PAD  Clinical Impression  Pt pleasant and eager to get OOB and walk. Pt able to don shoe and prosthesis EOB without assist. Min assist to rise from bed surface for first time up and pt choosing to use RW for gait which he has at home but does not normally use. Pt lives with girlfriend who can assist at D/C and has all necessary DME for return home. Pt with limited assist required this session and anticipate as NGT able to be removed pt's mobility will progress sufficiently for return home. Pt does not require further therapy or DME at this time and encouraged OOB daily with nursing/mobility staff and walking. Will sign off with pt aware and agreeable.     HR 113 SPO2 95% on RA      If plan is discharge home, recommend the following: Assistance with cooking/housework   Can travel by private vehicle        Equipment Recommendations None recommended by PT  Recommendations for Other Services       Functional Status Assessment Patient has not had a recent decline in their functional status     Precautions / Restrictions Precautions Precautions: Other (comment) Precaution Comments: NGT, Rt prosthesis      Mobility  Bed Mobility Overal bed mobility: Needs Assistance Bed Mobility: Supine to Sit     Supine to sit: Min assist, HOB elevated     General bed mobility comments: HOB 20 degrees, min assist with cues for sequence to roll and rise from side. Pt with adjustable bed at home    Transfers Overall transfer level: Modified independent                 General transfer comment: pt able to stand from bed without assist     Ambulation/Gait Ambulation/Gait assistance: Supervision Gait Distance (Feet): 425 Feet Assistive device: Rolling walker (2 wheels) Gait Pattern/deviations: Step-through pattern, Decreased stride length   Gait velocity interpretation: 1.31 - 2.62 ft/sec, indicative of limited community ambulator   General Gait Details: pt with use of RW for safety this session which he normally does not use, assist for lines. pt with steady gait and able to self-regulate distance  Stairs            Wheelchair Mobility     Tilt Bed    Modified Rankin (Stroke Patients Only)       Balance Overall balance assessment: Mild deficits observed, not formally tested                                           Pertinent Vitals/Pain Pain Assessment Pain Assessment: 0-10 Pain Score: 2  Pain Location: abdomen Pain Descriptors / Indicators: Sore Pain Intervention(s): Limited activity within patient's tolerance, Monitored during session    Home Living Family/patient expects to be discharged to:: Private residence Living Arrangements: Spouse/significant other Available Help at Discharge: Family Type of Home: House Home Access: Ramped entrance       Home Layout: One level Home Equipment: Agricultural Consultant (2 wheels);Rollator (4 wheels);Wheelchair - manual;BSC/3in1;Shower seat;Grab bars - toilet  Prior Function Prior Level of Function : Independent/Modified Independent             Mobility Comments: walks without AD, drives, uses WC at night for trips to bathroom 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, independent with ADLs     Extremity/Trunk Assessment   Upper Extremity Assessment Upper Extremity Assessment: Overall WFL for tasks assessed    Lower Extremity Assessment Lower Extremity Assessment: Overall WFL for tasks assessed    Cervical / Trunk Assessment Cervical / Trunk Assessment: Normal  Communication   Communication Communication: No apparent  difficulties  Cognition Arousal: Alert Behavior During Therapy: WFL for tasks assessed/performed Overall Cognitive Status: Within Functional Limits for tasks assessed                                          General Comments      Exercises     Assessment/Plan    PT Assessment Patient does not need any further PT services  PT Problem List         PT Treatment Interventions      PT Goals (Current goals can be found in the Care Plan section)  Acute Rehab PT Goals PT Goal Formulation: All assessment and education complete, DC therapy    Frequency       Co-evaluation               AM-PAC PT 6 Clicks Mobility  Outcome Measure Help needed turning from your back to your side while in a flat bed without using bedrails?: None Help needed moving from lying on your back to sitting on the side of a flat bed without using bedrails?: A Little Help needed moving to and from a bed to a chair (including a wheelchair)?: A Little Help needed standing up from a chair using your arms (e.g., wheelchair or bedside chair)?: None Help needed to walk in hospital room?: A Little Help needed climbing 3-5 steps with a railing? : None 6 Click Score: 21    End of Session   Activity Tolerance: Patient tolerated treatment well Patient left: in chair;with call bell/phone within reach Nurse Communication: Mobility status PT Visit Diagnosis: Other abnormalities of gait and mobility (R26.89)    Time: 8790-8766 PT Time Calculation (min) (ACUTE ONLY): 24 min   Charges:   PT Evaluation $PT Eval Moderate Complexity: 1 Mod PT Treatments $Gait Training: 8-22 mins PT General Charges $$ ACUTE PT VISIT: 1 Visit         Lenoard SQUIBB, PT Acute Rehabilitation Services Office: [phone]   Lenoard NOVAK Milliana Reddoch 11/15/2023, 1:17 PM

## 2023-11-16 DIAGNOSIS — K56609 Unspecified intestinal obstruction, unspecified as to partial versus complete obstruction: Secondary | ICD-10-CM | POA: Diagnosis not present

## 2023-11-16 LAB — BASIC METABOLIC PANEL
Anion gap: 11 (ref 5–15)
BUN: 40 mg/dL — ABNORMAL HIGH (ref 8–23)
CO2: 24 mmol/L (ref 22–32)
Calcium: 8.6 mg/dL — ABNORMAL LOW (ref 8.9–10.3)
Chloride: 106 mmol/L (ref 98–111)
Creatinine, Ser: 1.51 mg/dL — ABNORMAL HIGH (ref 0.61–1.24)
GFR, Estimated: 50 mL/min — ABNORMAL LOW (ref >60)
Glucose, Bld: 164 mg/dL — ABNORMAL HIGH (ref 70–99)
Potassium: 4 mmol/L (ref 3.5–5.1)
Sodium: 141 mmol/L (ref 135–145)

## 2023-11-16 LAB — CBC
HCT: 31.9 % — ABNORMAL LOW (ref 39.0–52.0)
Hemoglobin: 10.3 g/dL — ABNORMAL LOW (ref 13.0–17.0)
MCH: 29.9 pg (ref 26.0–34.0)
MCHC: 32.3 g/dL (ref 30.0–36.0)
MCV: 92.5 fL (ref 80.0–100.0)
Platelets: 127 10*3/uL — ABNORMAL LOW (ref 150–400)
RBC: 3.45 MIL/uL — ABNORMAL LOW (ref 4.22–5.81)
RDW: 13.7 % (ref 11.5–15.5)
WBC: 8.8 10*3/uL (ref 4.0–10.5)
nRBC: 0 % (ref 0.0–0.2)

## 2023-11-16 LAB — SURGICAL PATHOLOGY

## 2023-11-16 MED ORDER — PANTOPRAZOLE SODIUM 40 MG IV SOLR
40.0000 mg | Freq: Every day | INTRAVENOUS | Status: DC
  Administered 2023-11-16 – 2023-11-18 (×3): 40 mg via INTRAVENOUS
  Filled 2023-11-16 (×4): qty 10

## 2023-11-16 MED ORDER — SODIUM CHLORIDE 0.9 % IV SOLN: INTRAVENOUS | Status: AC

## 2023-11-16 NOTE — Progress Notes (Addendum)
 2 Days Post-Op  Subjective: Pain controlled Small flatus, BMx1 yesterday Foley removed yesterday, in and out cath x2 but now he reports voiding on his own Mobilized with PT yesterday using his prosthesis.  ROS: See above, otherwise other systems negative  Objective: Vital signs in last 24 hours: Temp:  [97.7 F (36.5 C)-98.7 F (37.1 C)] 97.7 F (36.5 C) (01/07 0831) Pulse Rate:  [91-107] 99 (01/07 0831) Resp:  [16-21] 18 (01/07 0831) BP: (96-111)/(50-66) 106/65 (01/07 0831) SpO2:  [93 %-99 %] 99 % (01/07 0831) Last BM Date :  (PTA)  Intake/Output from previous day: 01/06 0701 - 01/07 0700 In: 523.8 [I.V.:493.8; NG/GT:30] Out: 3201 [Urine:1700; Emesis/NG output:1500; Stool:1] Intake/Output this shift: No intake/output data recorded.  PE: Gen: NAD Abd: soft, no significant distention, appropriately tender, midline incision c/I, some old bloody drainage noted on honeycomb dressing.  Nothing acute.  Lap site on L lateral side is clean and covered.  NGT in place with some dark bilious output, documented since yesterday.  Lab Results:  Recent Labs    11/15/23 0108 11/16/23 0341  WBC 8.4 8.8  HGB 11.0* 10.3*  HCT 34.3* 31.9*  PLT 139* 127*   BMET Recent Labs    11/15/23 0108 11/16/23 0341  NA 140 141  K 4.6 4.0  CL 104 106  CO2 24 24  GLUCOSE 195* 164*  BUN 39* 40*  CREATININE 1.73* 1.51*  CALCIUM  8.5* 8.6*   PT/INR No results for input(s): LABPROT, INR in the last 72 hours. CMP     Component Value Date/Time   NA 141 11/16/2023 0341   NA 144 10/19/2023 0000   K 4.0 11/16/2023 0341   CL 106 11/16/2023 0341   CO2 24 11/16/2023 0341   GLUCOSE 164 (H) 11/16/2023 0341   BUN 40 (H) 11/16/2023 0341   BUN 48 (H) 03/04/2023 1236   CREATININE 1.51 (H) 11/16/2023 0341   CREATININE 1.15 09/28/2011 1358   CALCIUM  8.6 (L) 11/16/2023 0341   PROT 7.3 11/13/2023 2230   PROT 6.3 04/08/2022 1326   ALBUMIN  4.0 11/13/2023 2230   ALBUMIN  3.6 (L)  04/08/2022 1326   AST 34 11/13/2023 2230   ALT 30 11/13/2023 2230   ALKPHOS 77 11/13/2023 2230   BILITOT 1.5 (H) 11/13/2023 2230   BILITOT 0.6 04/08/2022 1326   GFRNONAA 50 (L) 11/16/2023 0341   GFRAA 44 (L) 12/19/2020 1031   Lipase     Component Value Date/Time   LIPASE 27 11/13/2023 2230       Studies/Results: No results found.   Anti-infectives: Anti-infectives (From admission, onward)    Start     Dose/Rate Route Frequency Ordered Stop   11/14/23 0330  piperacillin -tazobactam (ZOSYN ) IVPB 3.375 g        3.375 g 100 mL/hr over 30 Minutes Intravenous  Once 11/14/23 0326 11/14/23 0757   11/14/23 0115  cefTRIAXone  (ROCEPHIN ) 2 g in sodium chloride  0.9 % 100 mL IVPB        2 g 200 mL/hr over 30 Minutes Intravenous  Once 11/14/23 0105 11/14/23 0243        Assessment/Plan POD 2, s/p ex lap with SBR for closed loop SBO, Dr. Stevie 1/5 - having some bowel function but NG also high output, clamp trial today - clamped by me at 0930. Low threshold to leave in place for increased abd pain, nausea, or high residuals. -PT eval for mobilization -ambulate TID, OOB into a chair -multi-modal pain control -foley out, spont voids -  no abx needed -labs reviewed and are stable  FEN - NPO/NGT/IVFs, clamp NGT, ice chips ok VTE - Lovenox  ID - none currently needed  DM CHF CKD CAD HTN PAD    LOS: 2 days    Almarie GORMAN Pringle , [facility] Surgery 11/16/2023, 9:33 AM Please see Amion for pager number during day hours 7:00am-4:30pm or 7:00am -11:30am on weekends

## 2023-11-16 NOTE — Progress Notes (Signed)
 Hand off report given to 2W Unit RN, pt's belongings given, Notified CCMD.

## 2023-11-16 NOTE — Progress Notes (Addendum)
 PROGRESS NOTE    Eduardo [name]  FMW:[id number] DOB: Sep 26, 1956 DOA: 11/13/2023 PCP: [name], MD  67/M with CAD, multiple PCI, not candidate for CABG, ischemic cardiomyopathy, EF 30%, ICD, CKD 3 AAA, hypertension, type 2 diabetes mellitus, right BKA, PAD presented to the ED 1/4 with nausea vomiting and abdominal pain, admitted 1/5, with closed-loop small bowel obstruction, urgent surgery, underwent small bowel resection with anastomosis 1/5   Subjective: -Small BM today, continues to have high NG output  Assessment and Plan:  SBO, closed-loop obstruction -Acute onset, status post diagnostic laparoscopy, small bowel resection with anastomosis 1/5 -NG tube clamped this morning, small BM, continues to have high output -Continue IV fluids today -Increase mobility, pain control -Per surgery   CKD3a -Stable,, monitor   DM2, well controlled -Recent A1c within normal limits -Sliding scale insulin , D5 discontinued   CAD, obstructive s/p multiple PCI -Currently unable to resume home meds given n.p.o. status  - Plavix , Zetia , gemfibrozil , Crestor , Entresto , torsemide  currently on hold while n.p.o.   HTN -Holding torsemide , Entresto  as above   Chronic systolic CHF, AICD Moderate to severe MR -Last echo 11/24 with EF 30%, normal RV, moderately elevated PASP, moderate to severe MR -Currently with SBO, n.p.o., continue IV fluids today -Entresto , diuretics on hold   Prior history of osteo/BKA(R) PAD -Stable, PT OT as appropriate postoperatively  Right liver lesion, suspected hemangioma -Incidentally noted on imaging, discussed with patient recommended MRI for PCP in 1 to 2 months   DVT prophylaxis: Lovenox  Code Status: Full Family Communication: None present at Disposition Plan: Home pending improvement  Consultants:    Procedures:   Antimicrobials:    Objective: Vitals:   11/15/23 2307 11/15/23 2312 11/16/23 0326 11/16/23 0831  BP: (!) 100/50 96/65 111/61 106/65   Pulse: 94 91 95 99  Resp: (!) 21 19 17 18   Temp: 98.6 F (37 C) 98.6 F (37 C) 98.7 F (37.1 C) 97.7 F (36.5 C)  TempSrc: Oral Oral Oral Oral  SpO2: 97% 97% 97% 99%  Weight:      Height:        Intake/Output Summary (Last 24 hours) at 11/16/2023 1217 Last data filed at 11/16/2023 0544 Gross per 24 hour  Intake 523.75 ml  Output 3201 ml  Net -2677.25 ml   Filed Weights   11/13/23 2221  Weight: 103.7 kg    Examination:  General exam: Obese male sitting up in bed, AAOx3 HEENT: No JVD, NG tube with brown liquid CVS: S1-S2, regular rhythm, systolic murmur Lungs: Decreased breath sounds at the bases  abdomen: Mildly distended, tender as expected, surgical incision, bowel sounds absent Extremities: no edema Skin: No rashes Psychiatry:  Mood & affect appropriate.     Data Reviewed:   CBC: Recent Labs  Lab 11/13/23 2230 11/13/23 2241 11/14/23 0812 11/14/23 2208 11/15/23 0108 11/16/23 0341  WBC 10.7*  --  7.8 8.3 8.4 8.8  HGB 15.0 16.0 12.3* 10.7* 11.0* 10.3*  HCT 46.5 47.0 38.2* 32.6* 34.3* 31.9*  MCV 92.3  --  93.2 91.8 92.7 92.5  PLT 160  --  156 135* 139* 127*   Basic Metabolic Panel: Recent Labs  Lab 11/13/23 2230 11/13/23 2241 11/14/23 0812 11/15/23 0108 11/16/23 0341  NA 138 139  --  140 141  K 4.2 4.2  --  4.6 4.0  CL 100 101  --  104 106  CO2 25  --   --  24 24  GLUCOSE 253* 243*  --  195* 164*  BUN 37* 37*  --  39* 40*  CREATININE 1.62* 1.50* 1.53* 1.73* 1.51*  CALCIUM  9.5  --   --  8.5* 8.6*   GFR: Estimated Creatinine Clearance: 55.6 mL/min (A) (by C-G formula based on SCr of 1.51 mg/dL (H)). Liver Function Tests: Recent Labs  Lab 11/13/23 2230  AST 34  ALT 30  ALKPHOS 77  BILITOT 1.5*  PROT 7.3  ALBUMIN  4.0   Recent Labs  Lab 11/13/23 2230  LIPASE 27   No results for input(s): AMMONIA in the last 168 hours. Coagulation Profile: No results for input(s): INR, PROTIME in the last 168 hours. Cardiac Enzymes: No results  for input(s): CKTOTAL, CKMB, CKMBINDEX, TROPONINI in the last 168 hours. BNP (last 3 results) No results for input(s): PROBNP in the last 8760 hours. HbA1C: No results for input(s): HGBA1C in the last 72 hours. CBG: Recent Labs  Lab 11/14/23 0224 11/14/23 0432 11/14/23 0624 11/14/23 0716 11/15/23 2008  GLUCAP 242* 229* 211* 220* 167*   Lipid Profile: No results for input(s): CHOL, HDL, LDLCALC, TRIG, CHOLHDL, LDLDIRECT in the last 72 hours. Thyroid  Function Tests: No results for input(s): TSH, T4TOTAL, FREET4, T3FREE, THYROIDAB in the last 72 hours. Anemia Panel: No results for input(s): VITAMINB12, FOLATE, FERRITIN, TIBC, IRON , RETICCTPCT in the last 72 hours. Urine analysis:    Component Value Date/Time   COLORURINE YELLOW 11/15/2023 0512   APPEARANCEUR HAZY (A) 11/15/2023 0512   LABSPEC 1.026 11/15/2023 0512   PHURINE 5.0 11/15/2023 0512   GLUCOSEU NEGATIVE 11/15/2023 0512   HGBUR SMALL (A) 11/15/2023 0512   BILIRUBINUR NEGATIVE 11/15/2023 0512   BILIRUBINUR negative 06/26/2021 1300   BILIRUBINUR NEG 10/13/2012 0925   KETONESUR 5 (A) 11/15/2023 0512   PROTEINUR 30 (A) 11/15/2023 0512   UROBILINOGEN 0.2 06/26/2021 1300   NITRITE NEGATIVE 11/15/2023 0512   LEUKOCYTESUR SMALL (A) 11/15/2023 0512   Sepsis Labs: @LABRCNTIP (procalcitonin:4,lacticidven:4)  ) Recent Results (from the past 240 hours)  Resp panel by RT-PCR (RSV, Flu A&B, Covid) Anterior Nasal Swab     Status: None   Collection Time: 11/14/23  2:17 AM   Specimen: Anterior Nasal Swab  Result Value Ref Range Status   SARS Coronavirus 2 by RT PCR NEGATIVE NEGATIVE Final   Influenza A by PCR NEGATIVE NEGATIVE Final   Influenza B by PCR NEGATIVE NEGATIVE Final    Comment: (NOTE) The Xpert Xpress SARS-CoV-2/FLU/RSV plus assay is intended as an aid in the diagnosis of influenza from Nasopharyngeal swab specimens and should not be used as a sole basis for treatment.  Nasal washings and aspirates are unacceptable for Xpert Xpress SARS-CoV-2/FLU/RSV testing.  Fact Sheet for Patients: bloggercourse.com  Fact Sheet for Healthcare Providers: seriousbroker.it  This test is not yet approved or cleared by the [place]  FDA and has been authorized for detection and/or diagnosis of SARS-CoV-2 by FDA under an Emergency Use Authorization (EUA). This EUA will remain in effect (meaning this test can be used) for the duration of the COVID-19 declaration under Section 564(b)(1) of the Act, 21 U.S.C. section 360bbb-3(b)(1), unless the authorization is terminated or revoked.     Resp Syncytial Virus by PCR NEGATIVE NEGATIVE Final    Comment: (NOTE) Fact Sheet for Patients: bloggercourse.com  Fact Sheet for Healthcare Providers: seriousbroker.it  This test is not yet approved or cleared by the [place]  FDA and has been authorized for detection and/or diagnosis of SARS-CoV-2 by FDA under an Emergency Use Authorization (EUA). This EUA will remain in effect (meaning this  test can be used) for the duration of the COVID-19 declaration under Section 564(b)(1) of the Act, 21 U.S.C. section 360bbb-3(b)(1), unless the authorization is terminated or revoked.  Performed at [facility], [address], [place], [place] [address]      Radiology Studies: No results found.    Scheduled Meds:  enoxaparin  (LOVENOX ) injection  40 mg Subcutaneous Daily   insulin  aspart  0-9 Units Subcutaneous Q4H   pantoprazole  (PROTONIX ) IV  40 mg Intravenous Daily   Continuous Infusions:     LOS: 2 days    Time spent:    Sigurd Pac, MD [facility]   11/16/2023, 12:17 PM

## 2023-11-16 NOTE — Progress Notes (Signed)
 NG tube removed as per the order, no any issues experienced while removal, patient has complaint of pain on his nostril will give him analgesics,

## 2023-11-16 NOTE — Plan of Care (Signed)
  Problem: Clinical Measurements: Goal: Will remain free from infection Outcome: Progressing   Problem: Clinical Measurements: Goal: Ability to maintain clinical measurements within normal limits will improve Outcome: Progressing Goal: Will remain free from infection Outcome: Progressing Goal: Diagnostic test results will improve Outcome: Progressing Goal: Respiratory complications will improve Outcome: Progressing Goal: Cardiovascular complication will be avoided Outcome: Progressing   Problem: Clinical Measurements: Goal: Diagnostic test results will improve Outcome: Progressing   Problem: Clinical Measurements: Goal: Respiratory complications will improve Outcome: Progressing   Problem: Nutrition: Goal: Adequate nutrition will be maintained Outcome: Progressing   Problem: Activity: Goal: Risk for activity intolerance will decrease Outcome: Progressing   Problem: Elimination: Goal: Will not experience complications related to bowel motility Outcome: Progressing Goal: Will not experience complications related to urinary retention Outcome: Progressing   Problem: Safety: Goal: Ability to remain free from injury will improve Outcome: Progressing   Problem: Skin Integrity: Goal: Risk for impaired skin integrity will decrease Outcome: Progressing

## 2023-11-16 NOTE — Progress Notes (Addendum)
 Mobility Specialist Progress Note:   11/16/23 1117  Mobility  Activity Ambulated with assistance in hallway  Level of Assistance Standby assist, set-up cues, supervision of patient - no hands on  Assistive Device Front wheel walker  Distance Ambulated (ft) 325 ft  Activity Response Tolerated well  Mobility Referral Yes  Mobility visit 1 Mobility  Mobility Specialist Start Time (ACUTE ONLY) 1020  Mobility Specialist Stop Time (ACUTE ONLY) 1032  Mobility Specialist Time Calculation (min) (ACUTE ONLY) 12 min   Pre Mobility: 98 HR During Mobility: 113 HR  Post Mobility: 109 HR   Pt received in bed, agreeable to mobility. No hands on assistance for OOB activity. Pt donned prosthesis independently. Pt c/o fatigue during ambulation requesting to return back to room. VSS throughout. Pt left in chair with call bell in reach and all needs met.   Eduardo [name]  Mobility Specialist Please contact via [employer] office at [phone]

## 2023-11-17 DIAGNOSIS — K56609 Unspecified intestinal obstruction, unspecified as to partial versus complete obstruction: Secondary | ICD-10-CM | POA: Diagnosis not present

## 2023-11-17 LAB — CBC
HCT: 32.2 % — ABNORMAL LOW (ref 39.0–52.0)
Hemoglobin: 10.4 g/dL — ABNORMAL LOW (ref 13.0–17.0)
MCH: 29.7 pg (ref 26.0–34.0)
MCHC: 32.3 g/dL (ref 30.0–36.0)
MCV: 92 fL (ref 80.0–100.0)
Platelets: 161 10*3/uL (ref 150–400)
RBC: 3.5 MIL/uL — ABNORMAL LOW (ref 4.22–5.81)
RDW: 13.6 % (ref 11.5–15.5)
WBC: 8.8 10*3/uL (ref 4.0–10.5)
nRBC: 0 % (ref 0.0–0.2)

## 2023-11-17 LAB — BASIC METABOLIC PANEL
Anion gap: 13 (ref 5–15)
BUN: 34 mg/dL — ABNORMAL HIGH (ref 8–23)
CO2: 24 mmol/L (ref 22–32)
Calcium: 8.5 mg/dL — ABNORMAL LOW (ref 8.9–10.3)
Chloride: 104 mmol/L (ref 98–111)
Creatinine, Ser: 1.32 mg/dL — ABNORMAL HIGH (ref 0.61–1.24)
GFR, Estimated: 59 mL/min — ABNORMAL LOW (ref >60)
Glucose, Bld: 175 mg/dL — ABNORMAL HIGH (ref 70–99)
Potassium: 3.6 mmol/L (ref 3.5–5.1)
Sodium: 141 mmol/L (ref 135–145)

## 2023-11-17 LAB — GLUCOSE, CAPILLARY
Glucose-Capillary: 159 mg/dL — ABNORMAL HIGH (ref 70–99)
Glucose-Capillary: 182 mg/dL — ABNORMAL HIGH (ref 70–99)

## 2023-11-17 MED ORDER — EZETIMIBE 10 MG PO TABS
10.0000 mg | ORAL_TABLET | Freq: Every day | ORAL | Status: DC
  Administered 2023-11-17 – 2023-11-18 (×2): 10 mg via ORAL
  Filled 2023-11-17 (×2): qty 1

## 2023-11-17 MED ORDER — MEDIHONEY WOUND/BURN DRESSING EX PSTE
1.0000 | PASTE | Freq: Every day | CUTANEOUS | Status: DC
  Administered 2023-11-17 – 2023-11-18 (×3): 1 via TOPICAL
  Filled 2023-11-17: qty 44

## 2023-11-17 MED ORDER — ACETAMINOPHEN 325 MG PO TABS: 650.0000 mg | ORAL_TABLET | Freq: Four times a day (QID) | ORAL | Status: DC | PRN

## 2023-11-17 MED ORDER — ROSUVASTATIN CALCIUM 5 MG PO TABS
10.0000 mg | ORAL_TABLET | Freq: Every day | ORAL | Status: DC
  Administered 2023-11-17 – 2023-11-18 (×2): 10 mg via ORAL
  Filled 2023-11-17 (×2): qty 2

## 2023-11-17 MED ORDER — CLOPIDOGREL BISULFATE 75 MG PO TABS
75.0000 mg | ORAL_TABLET | Freq: Every day | ORAL | Status: DC
  Administered 2023-11-17 – 2023-11-18 (×2): 75 mg via ORAL
  Filled 2023-11-17 (×2): qty 1

## 2023-11-17 MED ORDER — TORSEMIDE 20 MG PO TABS
20.0000 mg | ORAL_TABLET | Freq: Every day | ORAL | Status: DC
  Administered 2023-11-17 – 2023-11-18 (×2): 20 mg via ORAL
  Filled 2023-11-17 (×2): qty 1

## 2023-11-17 MED ORDER — GEMFIBROZIL 600 MG PO TABS
600.0000 mg | ORAL_TABLET | Freq: Two times a day (BID) | ORAL | Status: DC
  Administered 2023-11-17 – 2023-11-18 (×2): 600 mg via ORAL
  Filled 2023-11-17 (×2): qty 1

## 2023-11-17 MED ORDER — SACUBITRIL-VALSARTAN 24-26 MG PO TABS
1.0000 | ORAL_TABLET | Freq: Two times a day (BID) | ORAL | Status: DC
  Administered 2023-11-17 – 2023-11-18 (×3): 1 via ORAL
  Filled 2023-11-17 (×3): qty 1

## 2023-11-17 NOTE — Progress Notes (Signed)
 3 Days Post-Op  Subjective: Pain controlled Small flatus, BMx1 yesterday Foley removed yesterday, in and out cath x2 but now he reports voiding on his own Mobilized with PT yesterday using his prosthesis.  ROS: See above, otherwise other systems negative  Objective: Vital signs in last 24 hours: Temp:  [98.2 F (36.8 C)-99.7 F (37.6 C)] 98.7 F (37.1 C) (01/08 0736) Pulse Rate:  [90-109] 96 (01/08 0736) Resp:  [18-24] 19 (01/08 0506) BP: (102-117)/(60-75) 110/71 (01/08 0736) SpO2:  [94 %-99 %] 96 % (01/08 0736) Last BM Date : 11/17/23  Intake/Output from previous day: 01/07 0701 - 01/08 0700 In: 78.8 [I.V.:78.8] Out: 1600 [Urine:950; Emesis/NG output:650] Intake/Output this shift: No intake/output data recorded.  PE: Gen: NAD Abd: soft, no significant distention, appropriately tender, midline incision clean with visible staples, some old bloody drainage noted on honeycomb dressing.  Nothing acute.  Lap site on L lateral side is clean with single staple in place.   Lab Results:  Recent Labs    11/16/23 0341 11/17/23 0642  WBC 8.8 8.8  HGB 10.3* 10.4*  HCT 31.9* 32.2*  PLT 127* 161   BMET Recent Labs    11/16/23 0341 11/17/23 0642  NA 141 141  K 4.0 3.6  CL 106 104  CO2 24 24  GLUCOSE 164* 175*  BUN 40* 34*  CREATININE 1.51* 1.32*  CALCIUM  8.6* 8.5*   PT/INR No results for input(s): LABPROT, INR in the last 72 hours. CMP     Component Value Date/Time   NA 141 11/17/2023 0642   NA 144 10/19/2023 0000   K 3.6 11/17/2023 0642   CL 104 11/17/2023 0642   CO2 24 11/17/2023 0642   GLUCOSE 175 (H) 11/17/2023 0642   BUN 34 (H) 11/17/2023 0642   BUN 48 (H) 03/04/2023 1236   CREATININE 1.32 (H) 11/17/2023 0642   CREATININE 1.15 09/28/2011 1358   CALCIUM  8.5 (L) 11/17/2023 0642   PROT 7.3 11/13/2023 2230   PROT 6.3 04/08/2022 1326   ALBUMIN  4.0 11/13/2023 2230   ALBUMIN  3.6 (L) 04/08/2022 1326   AST 34 11/13/2023 2230   ALT 30 11/13/2023 2230    ALKPHOS 77 11/13/2023 2230   BILITOT 1.5 (H) 11/13/2023 2230   BILITOT 0.6 04/08/2022 1326   GFRNONAA 59 (L) 11/17/2023 0642   GFRAA 44 (L) 12/19/2020 1031   Lipase     Component Value Date/Time   LIPASE 27 11/13/2023 2230       Studies/Results: No results found.   Anti-infectives: Anti-infectives (From admission, onward)    Start     Dose/Rate Route Frequency Ordered Stop   11/14/23 0330  piperacillin -tazobactam (ZOSYN ) IVPB 3.375 g        3.375 g 100 mL/hr over 30 Minutes Intravenous  Once 11/14/23 0326 11/14/23 0757   11/14/23 0115  cefTRIAXone  (ROCEPHIN ) 2 g in sodium chloride  0.9 % 100 mL IVPB        2 g 200 mL/hr over 30 Minutes Intravenous  Once 11/14/23 0105 11/14/23 0243        Assessment/Plan POD 3, s/p ex lap with SBR for closed loop SBO, Dr. Stevie 1/5 - NGT out yesterday and tolerating CLD, having soft BMs - advance to CM diet  - ambulate TID, OOB into a chair - multi-modal pain control - foley out, spont voids - no abx needed - may be ready for DC as early as tomorrow from surgical standpoint if tolerating diet advancement and pain controlled on PO meds  FEN - CM diet  VTE - Lovenox  ID - none currently needed  - per TRH -  DM CHF CKD CAD HTN PAD    LOS: 3 days    Burnard JONELLE Louder , [facility] 11/17/2023, 8:43 AM Please see Amion for pager number during day hours 7:00am-4:30pm or 7:00am -11:30am on weekends

## 2023-11-17 NOTE — Consult Note (Signed)
 WOC Nurse Consult Note: patient underwent R BKA Dr. Harden 03/2022; last seen in his office 04/15/2023 and noted to have a superficial ulcer to R limb Patient has developed a superficial ulcer on the end of the residual limb on the right secondary to subsiding in his socket. He does have a new sleeve and liner patient was referred to [facility] for socket modifications at that time  Reason for Consult: R LE PI  Wound type: full thickness r/t pressure and friction  Pressure Injury POA: yes, possible medical device related  Measurement: see nursing flowsheet  Wound bed: 90% yellow slough 10% pink moist  Drainage (amount, consistency, odor) appears to be minimal tan  Periwound: intact  Dressing procedure/placement/frequency: Clean R limb ulcer with NS, apply Medihoney to wound bed daily, cover with dry gauze and silicone foam.  May lift foam daily to replace Medihoney.  Change foam q3 days and prn soiling.   Patient should follow-up with Dr. Harden at discharge for further recommendations regarding this ulcer.   POC discussed with bedside nurse. WOC team will not follow. Re-consult if further needs arise.   Thank you,    Powell Bar MSN, RN-BC, [employer] [phone]

## 2023-11-17 NOTE — Discharge Instructions (Signed)
 [facility]      [facility], [place] [phone]  OPEN ABDOMINAL SURGERY: POST OP INSTRUCTIONS  Always review your discharge instruction sheet given to you by the facility where your surgery was performed.  IF YOU HAVE DISABILITY OR FAMILY LEAVE FORMS, YOU MUST BRING THEM TO THE OFFICE FOR PROCESSING.  PLEASE DO NOT GIVE THEM TO YOUR DOCTOR.  A prescription for pain medication may be given to you upon discharge.  Take your pain medication as prescribed, if needed.  If narcotic pain medicine is not needed, then you may take acetaminophen (Tylenol) or ibuprofen (Advil) as needed. Take your usually prescribed medications unless otherwise directed. If you need a refill on your pain medication, please contact your pharmacy. They will contact our office to request authorization.  Prescriptions will not be filled after 5pm or on week-ends. You should follow a light diet the first few days after arrival home, such as soup and crackers, pudding, etc.unless your doctor has advised otherwise. A high-fiber, low fat diet can be resumed as tolerated.   Be sure to include lots of fluids daily. Most patients will experience some swelling and bruising on the chest and neck area.  Ice packs will help.  Swelling and bruising can take several days to resolve Most patients will experience some swelling and bruising in the area of the incision. Ice pack will help. Swelling and bruising can take several days to resolve..  It is common to experience some constipation if taking pain medication after surgery.  Increasing fluid intake and taking a stool softener will usually help or prevent this problem from occurring.  A mild laxative (Milk of Magnesia or Miralax) should be taken according to package directions if there are no bowel movements after 48 hours.  You may have steri-strips (small skin tapes) in place directly over the incision.  These strips should be left on the skin for 7-10 days.  If your surgeon used skin  glue on the incision, you may shower in 24 hours.  The glue will flake off over the next 2-3 weeks.  Any sutures or staples will be removed at the office during your follow-up visit. You may find that a light gauze bandage over your incision may keep your staples from being rubbed or pulled. You may shower and replace the bandage daily. ACTIVITIES:  You may resume regular (light) daily activities beginning the next day--such as daily self-care, walking, climbing stairs--gradually increasing activities as tolerated.  You may have sexual intercourse when it is comfortable.  Refrain from any heavy lifting or straining until approved by your doctor. You may drive when you no longer are taking prescription pain medication, you can comfortably wear a seatbelt, and you can safely maneuver your car and apply brakes  You should see your doctor in the office for a follow-up appointment approximately two weeks after your surgery.  Make sure that you call for this appointment within a day or two after you arrive home to insure a convenient appointment time.   WHEN TO CALL YOUR DOCTOR: Fever over 101.0 Inability to urinate Nausea and/or vomiting Extreme swelling or bruising Continued bleeding from incision. Increased pain, redness, or drainage from the incision. Difficulty swallowing or breathing Muscle cramping or spasms. Numbness or tingling in hands or feet or around lips.  The clinic staff is available to answer your questions during regular business hours.  Please don't hesitate to call and ask to speak to one of the nurses if you have concerns.  For further questions, please visit [email or web address]

## 2023-11-17 NOTE — Plan of Care (Signed)

## 2023-11-17 NOTE — Progress Notes (Signed)
 PROGRESS NOTE    Eduardo [name]  FMW:[id number] DOB: 09-22-1956 DOA: 11/13/2023 PCP: [name], MD  67/M with CAD, multiple PCI, not candidate for CABG, ischemic cardiomyopathy, EF 30%, ICD, CKD 3 AAA, hypertension, type 2 diabetes mellitus, right BKA, PAD presented to the ED 1/4 with nausea vomiting and abdominal pain, admitted 1/5, with closed-loop small bowel obstruction, urgent surgery, underwent small bowel resection with anastomosis 1/5   Subjective: -NG tube removed yesterday afternoon, tolerating clears, had BM  Assessment and Plan:  SBO, closed-loop obstruction -Acute onset, status post diagnostic laparoscopy, small bowel resection with anastomosis 1/5 -Postop ileus appears to be resolving, tolerated clears yesterday, NG removed 1/7  -Advance diet, increase activity, discharge planning, possibly home tomorrow   CKD3a -Stable,, monitor   DM2, well controlled -Recent A1c within normal limits -Continue sliding scale, he takes Humulin  and at home and does not want to use Semglee  here   CAD, obstructive s/p multiple PCI -Currently unable to resume home meds given n.p.o. status  -Restart Plavix , Zetia , Crestor    HTN -Holding torsemide , Entresto  as above   Chronic systolic CHF, AICD Moderate to severe MR -Last echo 11/24 with EF 30%, normal RV, moderately elevated PASP, moderate to severe MR -Diet resumed, clinically appears euvolemic, restart torsemide , Entresto    Prior history of osteo/BKA(R) PAD -Stable, PT OT as appropriate postoperatively  Right liver lesion, suspected hemangioma -Incidentally noted on imaging, discussed with patient recommended MRI for PCP in 1 to 2 months   DVT prophylaxis: Lovenox  Code Status: Full Family Communication: None present  Disposition Plan: Home pending improvement, likely tomorrow  Consultants:    Procedures:   Antimicrobials:    Objective: Vitals:   11/16/23 2022 11/17/23 0035 11/17/23 0506 11/17/23 0736  BP: 110/75  115/63 102/60 110/71  Pulse: (!) 109 (!) 107 90 96  Resp: 18 20 19    Temp: 98.8 F (37.1 C) 98.9 F (37.2 C) 99.2 F (37.3 C) 98.7 F (37.1 C)  TempSrc: Oral Oral Oral Oral  SpO2: 95% 95% 94% 96%  Weight:      Height:        Intake/Output Summary (Last 24 hours) at 11/17/2023 1008 Last data filed at 11/17/2023 0038 Gross per 24 hour  Intake 78.75 ml  Output 1600 ml  Net -1521.25 ml   Filed Weights   11/13/23 2221  Weight: 103.7 kg    Examination:  General exam: Obese male sitting up in bed, AAOx3 HEENT: No JVD CVS: S1-S2, regular rhythm, systolic murmur Lungs: Decreased breath sounds at the bases  abdomen: Mildly distended, tender as expected, surgical incision, bowel sounds + Extremities: no edema, R BKA Skin: No rashes Psychiatry:  Mood & affect appropriate.     Data Reviewed:   CBC: Recent Labs  Lab 11/14/23 0812 11/14/23 2208 11/15/23 0108 11/16/23 0341 11/17/23 0642  WBC 7.8 8.3 8.4 8.8 8.8  HGB 12.3* 10.7* 11.0* 10.3* 10.4*  HCT 38.2* 32.6* 34.3* 31.9* 32.2*  MCV 93.2 91.8 92.7 92.5 92.0  PLT 156 135* 139* 127* 161   Basic Metabolic Panel: Recent Labs  Lab 11/13/23 2230 11/13/23 2241 11/14/23 0812 11/15/23 0108 11/16/23 0341 11/17/23 0642  NA 138 139  --  140 141 141  K 4.2 4.2  --  4.6 4.0 3.6  CL 100 101  --  104 106 104  CO2 25  --   --  24 24 24   GLUCOSE 253* 243*  --  195* 164* 175*  BUN 37* 37*  --  39* 40* 34*  CREATININE 1.62* 1.50* 1.53* 1.73* 1.51* 1.32*  CALCIUM  9.5  --   --  8.5* 8.6* 8.5*   GFR: Estimated Creatinine Clearance: 63.6 mL/min (A) (by C-G formula based on SCr of 1.32 mg/dL (H)). Liver Function Tests: Recent Labs  Lab 11/13/23 2230  AST 34  ALT 30  ALKPHOS 77  BILITOT 1.5*  PROT 7.3  ALBUMIN  4.0   Recent Labs  Lab 11/13/23 2230  LIPASE 27   No results for input(s): AMMONIA in the last 168 hours. Coagulation Profile: No results for input(s): INR, PROTIME in the last 168 hours. Cardiac  Enzymes: No results for input(s): CKTOTAL, CKMB, CKMBINDEX, TROPONINI in the last 168 hours. BNP (last 3 results) No results for input(s): PROBNP in the last 8760 hours. HbA1C: No results for input(s): HGBA1C in the last 72 hours. CBG: Recent Labs  Lab 11/14/23 0432 11/14/23 0624 11/14/23 0716 11/15/23 2008 11/17/23 0738  GLUCAP 229* 211* 220* 167* 159*   Lipid Profile: No results for input(s): CHOL, HDL, LDLCALC, TRIG, CHOLHDL, LDLDIRECT in the last 72 hours. Thyroid  Function Tests: No results for input(s): TSH, T4TOTAL, FREET4, T3FREE, THYROIDAB in the last 72 hours. Anemia Panel: No results for input(s): VITAMINB12, FOLATE, FERRITIN, TIBC, IRON , RETICCTPCT in the last 72 hours. Urine analysis:    Component Value Date/Time   COLORURINE YELLOW 11/15/2023 0512   APPEARANCEUR HAZY (A) 11/15/2023 0512   LABSPEC 1.026 11/15/2023 0512   PHURINE 5.0 11/15/2023 0512   GLUCOSEU NEGATIVE 11/15/2023 0512   HGBUR SMALL (A) 11/15/2023 0512   BILIRUBINUR NEGATIVE 11/15/2023 0512   BILIRUBINUR negative 06/26/2021 1300   BILIRUBINUR NEG 10/13/2012 0925   KETONESUR 5 (A) 11/15/2023 0512   PROTEINUR 30 (A) 11/15/2023 0512   UROBILINOGEN 0.2 06/26/2021 1300   NITRITE NEGATIVE 11/15/2023 0512   LEUKOCYTESUR SMALL (A) 11/15/2023 0512   Sepsis Labs: @LABRCNTIP (procalcitonin:4,lacticidven:4)  ) Recent Results (from the past 240 hours)  Resp panel by RT-PCR (RSV, Flu A&B, Covid) Anterior Nasal Swab     Status: None   Collection Time: 11/14/23  2:17 AM   Specimen: Anterior Nasal Swab  Result Value Ref Range Status   SARS Coronavirus 2 by RT PCR NEGATIVE NEGATIVE Final   Influenza A by PCR NEGATIVE NEGATIVE Final   Influenza B by PCR NEGATIVE NEGATIVE Final    Comment: (NOTE) The Xpert Xpress SARS-CoV-2/FLU/RSV plus assay is intended as an aid in the diagnosis of influenza from Nasopharyngeal swab specimens and should not be used as a sole  basis for treatment. Nasal washings and aspirates are unacceptable for Xpert Xpress SARS-CoV-2/FLU/RSV testing.  Fact Sheet for Patients: bloggercourse.com  Fact Sheet for Healthcare Providers: seriousbroker.it  This test is not yet approved or cleared by the [place]  FDA and has been authorized for detection and/or diagnosis of SARS-CoV-2 by FDA under an Emergency Use Authorization (EUA). This EUA will remain in effect (meaning this test can be used) for the duration of the COVID-19 declaration under Section 564(b)(1) of the Act, 21 U.S.C. section 360bbb-3(b)(1), unless the authorization is terminated or revoked.     Resp Syncytial Virus by PCR NEGATIVE NEGATIVE Final    Comment: (NOTE) Fact Sheet for Patients: bloggercourse.com  Fact Sheet for Healthcare Providers: seriousbroker.it  This test is not yet approved or cleared by the [place]  FDA and has been authorized for detection and/or diagnosis of SARS-CoV-2 by FDA under an Emergency Use Authorization (EUA). This EUA will remain in effect (meaning this test can be  used) for the duration of the COVID-19 declaration under Section 564(b)(1) of the Act, 21 U.S.C. section 360bbb-3(b)(1), unless the authorization is terminated or revoked.  Performed at [facility], [address] [place], [place] [address]      Radiology Studies: No results found.    Scheduled Meds:  clopidogrel   75 mg Oral Daily   enoxaparin  (LOVENOX ) injection  40 mg Subcutaneous Daily   ezetimibe   10 mg Oral Daily   gemfibrozil   600 mg Oral BID AC   insulin  aspart  0-9 Units Subcutaneous Q4H   leptospermum manuka honey  1 Application Topical Daily   pantoprazole  (PROTONIX ) IV  40 mg Intravenous Daily   rosuvastatin   10 mg Oral Daily   sacubitril -valsartan   1 tablet Oral BID   torsemide   20 mg Oral Daily   Continuous  Infusions:     LOS: 3 days    Time spent:    Sigurd Pac, MD [facility]   11/17/2023, 10:08 AM

## 2023-11-17 NOTE — Progress Notes (Signed)
 Mobility Specialist Progress Note:   11/17/23 0917  Mobility  Activity Ambulated with assistance in hallway  Level of Assistance Contact guard assist, steadying assist  Assistive Device Front wheel walker  Distance Ambulated (ft) 325 ft  Activity Response Tolerated well  Mobility Referral Yes  Mobility visit 1 Mobility  Mobility Specialist Start Time (ACUTE ONLY) [id number]  Mobility Specialist Stop Time (ACUTE ONLY) 0910  Mobility Specialist Time Calculation (min) (ACUTE ONLY) 18 min   Pt received dangling EOB, agreeable to mobility session. Able to donn RLE prosthetic independently. Required RW for stability. Ambulated in hallway with CGA. Tolerated well, asx throughout. Returned pt to room, requested to use bathroom. Left with all needs met.    Yong Wahlquist Mobility Specialist Please contact via Special Educational Needs Teacher or  Rehab office at [phone]

## 2023-11-18 ENCOUNTER — Other Ambulatory Visit (HOSPITAL_COMMUNITY): Payer: Self-pay

## 2023-11-18 DIAGNOSIS — K56609 Unspecified intestinal obstruction, unspecified as to partial versus complete obstruction: Secondary | ICD-10-CM | POA: Diagnosis not present

## 2023-11-18 LAB — CBC
HCT: 33.1 % — ABNORMAL LOW (ref 39.0–52.0)
Hemoglobin: 10.6 g/dL — ABNORMAL LOW (ref 13.0–17.0)
MCH: 29.3 pg (ref 26.0–34.0)
MCHC: 32 g/dL (ref 30.0–36.0)
MCV: 91.4 fL (ref 80.0–100.0)
Platelets: 188 10*3/uL (ref 150–400)
RBC: 3.62 MIL/uL — ABNORMAL LOW (ref 4.22–5.81)
RDW: 13.2 % (ref 11.5–15.5)
WBC: 6.7 10*3/uL (ref 4.0–10.5)
nRBC: 0 % (ref 0.0–0.2)

## 2023-11-18 LAB — BASIC METABOLIC PANEL
Anion gap: 9 (ref 5–15)
BUN: 28 mg/dL — ABNORMAL HIGH (ref 8–23)
CO2: 27 mmol/L (ref 22–32)
Calcium: 8.4 mg/dL — ABNORMAL LOW (ref 8.9–10.3)
Chloride: 104 mmol/L (ref 98–111)
Creatinine, Ser: 1.4 mg/dL — ABNORMAL HIGH (ref 0.61–1.24)
GFR, Estimated: 55 mL/min — ABNORMAL LOW (ref >60)
Glucose, Bld: 179 mg/dL — ABNORMAL HIGH (ref 70–99)
Potassium: 4.1 mmol/L (ref 3.5–5.1)
Sodium: 140 mmol/L (ref 135–145)

## 2023-11-18 MED ORDER — OXYCODONE HCL 5 MG PO TABS
5.0000 mg | ORAL_TABLET | Freq: Four times a day (QID) | ORAL | 0 refills | Status: DC | PRN
  Filled 2023-11-18: qty 20, 5d supply, fill #0

## 2023-11-18 MED ORDER — SENNOSIDES-DOCUSATE SODIUM 8.6-50 MG PO TABS
1.0000 | ORAL_TABLET | Freq: Every evening | ORAL | 0 refills | Status: DC | PRN
  Filled 2023-11-18: qty 30, 30d supply, fill #0

## 2023-11-18 NOTE — Discharge Summary (Signed)
 Physician Discharge Summary  Eduardo [name] FMW:[id number] DOB: 1956/10/01 DOA: 11/13/2023  PCP: [name], MD  Admit date: 11/13/2023 Discharge date: 11/18/2023  Time spent: 45 minutes  Recommendations for Outpatient Follow-up:  General surgery in 10 days, follow-up made PCP in 1 week, needs MRI of right liver in 1 to 2 months to reassess liver lesion suspected hemangioma incidentally noted on imaging  Discharge Diagnoses:  Principal Problem:   Small bowel obstruction due to adhesions (HCC) CKD 3A Type 2 diabetes mellitus History of CAD Chronic systolic CHF Moderate to severe mitral regurgitation Right liver lesion suspected hemangioma Right BKA  Discharge Condition: Improved  Diet recommendation:, Heart healthy, diabetic  Filed Weights   11/13/23 2221  Weight: 103.7 kg    History of present illness:  67/M with CAD, multiple PCI, not candidate for CABG, ischemic cardiomyopathy, EF 30%, ICD, CKD 3 AAA, hypertension, type 2 diabetes mellitus, right BKA, PAD presented to the ED 1/4 with nausea vomiting and abdominal pain, admitted 1/5, with closed-loop small bowel obstruction, urgent surgery, underwent small bowel resection with anastomosis 1/5   Hospital Course:   SBO, closed-loop obstruction -Acute onset, status post diagnostic laparoscopy, small bowel resection with anastomosis 1/5 -Postop ileus resolved, NG tube removed 1/7, tolerated solids yesterday -Discharge home today, follow-up with general surgery arranged   CKD3a -Stable,, monitor   DM2, well controlled -Recent A1c within normal limits -Continue sliding scale, he takes Humulin  and at home and does not want to use Semglee  here   CAD, obstructive s/p multiple PCI -Resumed Plavix , Zetia , Crestor    HTN -Resumed torsemide , Entresto     Chronic systolic CHF, AICD Moderate to severe MR -Last echo 11/24 with EF 30%, normal RV, moderately elevated PASP, moderate to severe MR -Diet resumed, clinically appears  euvolemic, restarted torsemide , Entresto    Prior history of osteo/BKA(R) PAD -Stable   Right liver lesion, suspected hemangioma -Incidentally noted on imaging, discussed with patient recommended MRI for PCP in 1 to 2 months  Discharge Exam: Vitals:   11/18/23 0454 11/18/23 0912  BP: (!) 93/58 (!) 91/52  Pulse: 76 77  Resp: 18 18  Temp: 98.7 F (37.1 C) 97.6 F (36.4 C)  SpO2: 96% 98%   Gen: Awake, Alert, Oriented X 3,  HEENT: no JVD Lungs: Good air movement bilaterally, CTAB CVS: S1S2/RRR, systolic murmur Abd: Soft, less distended, mild expected tenderness, surgical incisions Extremities: No edema, right BKA Skin: no new rashes on exposed skin   Discharge Instructions   Discharge Instructions     Diet - low sodium heart healthy   Complete by: As directed    Discharge wound care:   Complete by: As directed    routine   Increase activity slowly   Complete by: As directed       Allergies as of 11/18/2023       Reactions   Empagliflozin     Other Reaction(s): foot infection   Shellfish-derived Products Nausea And Vomiting, Other (See Comments)   Only Mussels cause severe nausea and vomiting   Latex Rash   Tape Rash, Other (See Comments)   Caused issues with the skin   Testosterone  Rash   did not feel well        Medication List     TAKE these medications    acetaminophen  325 MG tablet Commonly known as: TYLENOL  Take 1-2 tablets (325-650 mg total) by mouth every 4 (four) hours as needed for mild pain.   clopidogrel  75 MG tablet Commonly known as: PLAVIX   TAKE 1 TABLET BY MOUTH DAILY   Entresto  24-26 MG Generic drug: sacubitril -valsartan  TAKE 1 TABLET BY MOUTH TWICE  DAILY   ESTER C PO Take 1,000 mg by mouth daily.   ezetimibe  10 MG tablet Commonly known as: ZETIA  TAKE 1 TABLET BY MOUTH DAILY   gemfibrozil  600 MG tablet Commonly known as: LOPID  Take 600 mg by mouth 2 (two) times daily before a meal.   HumaLOG KwikPen 100 UNIT/ML  KwikPen Generic drug: insulin  lispro Inject 3-10 Units into the skin 3 (three) times daily. Inject 01-1009 units into the skin three times a day, per sliding scale- based on BGL >100   Krill Oil 300 MG Caps Take 300 mg by mouth daily.   Melatonin 10 MG Tabs Take 10 mg by mouth at bedtime.   Mounjaro 10 MG/0.5ML Pen Generic drug: tirzepatide Inject 10 mg into the skin once a week.   multivitamin with minerals Tabs tablet Take 1 tablet by mouth daily.   nitroGLYCERIN  0.4 MG SL tablet Commonly known as: NITROSTAT  Place 1 tablet (0.4 mg total) under the tongue every 5 (five) minutes as needed for chest pain.   oxyCODONE  5 MG immediate release tablet Commonly known as: Oxy IR/ROXICODONE  Take 1 tablet (5 mg total) by mouth every 6 (six) hours as needed for moderate pain (pain score 4-6) or severe pain (pain score 7-10).   PROBIOTIC-10 PO Take 1 capsule by mouth daily.   rosuvastatin  20 MG tablet Commonly known as: CRESTOR  Take 1 tablet (20 mg total) by mouth daily.   senna-docusate 8.6-50 MG tablet Commonly known as: Senokot-S Take 1 tablet by mouth at bedtime as needed for mild constipation.   sodium chloride  0.65 % Soln nasal spray Commonly known as: OCEAN Place 1 spray into both nostrils as needed for congestion.   torsemide  20 MG tablet Commonly known as: DEMADEX  Take 1 tablet (20 mg total) by mouth daily. Take additional one tablet by mouth as needed for swelling   Vitamin D  50 MCG (2000 UT) Caps Take 2,000 Units by mouth daily.               Discharge Care Instructions  (From admission, onward)           Start     Ordered   11/18/23 0000  Discharge wound care:       Comments: routine   11/18/23 0928           Allergies  Allergen Reactions   Empagliflozin      Other Reaction(s): foot infection   Shellfish-Derived Products Nausea And Vomiting and Other (See Comments)    Only Mussels cause severe nausea and vomiting    Latex Rash   Tape Rash  and Other (See Comments)    Caused issues with the skin   Testosterone  Rash    did not feel well     Follow-up Information     [facility], [facility]. Go on 11/29/2023.   Specialty: General Surgery Why: 10 AM for staple removal. Please arrive 20 min prior to appointment time, this is a nurse visit. Contact information: [address] [place] [address] [phone]         Kinsinger, Herlene Righter, MD. Go on 12/08/2023.   Specialty: General Surgery Why: 9:30 AM, please arrive 15 min prior to appointment time. Contact information: [address] [place] [address] [phone]  The results of significant diagnostics from this hospitalization (including imaging, microbiology, ancillary and laboratory) are listed below for reference.    Significant Diagnostic Studies: CT ABDOMEN PELVIS W CONTRAST Result Date: 11/14/2023 CLINICAL DATA:  Periumbilical abdominal pain with nausea and vomiting EXAM: CT ABDOMEN AND PELVIS WITH CONTRAST TECHNIQUE: Multidetector CT imaging of the abdomen and pelvis was performed using the standard protocol following bolus administration of intravenous contrast. RADIATION DOSE REDUCTION: This exam was performed according to the departmental dose-optimization program which includes automated exposure control, adjustment of the mA and/or kV according to patient size and/or use of iterative reconstruction technique. CONTRAST:  75mL OMNIPAQUE  IOHEXOL  350 MG/ML SOLN COMPARISON:  None Available. FINDINGS: Lower chest: Small bilateral pleural effusions. Interlobular septal thickening and patchy ground-glass opacities in the lower lungs. Dilated left ventricle. Hepatobiliary: 8.8 cm hypoattenuating lesion in the posterior right hepatic lobe. There is suggestion of nodular peripheral enhancement which would be compatible with a benign hemangioma however further evaluation with hepatic protocol MRI of the abdomen is  recommended. No biliary dilation. Pancreas: Unremarkable. Spleen: Unremarkable. Adrenals/Urinary Tract: Normal adrenal glands. No urinary calculi or hydronephrosis. Unremarkable bladder. Stomach/Bowel: 2 adjacent transition points in the central mesentery. The small bowel is dilated upstream from the first transition point (series 3/image 46). The small bowel in between the 2 transition points is poorly enhancing with wall thickening and marked interloop fluid and stranding concerning for ischemia. The second transition point is anterior to the first (series 3/image 48) findings are compatible with ischemic bowel due to closed loop obstruction. The small bowel downstream from the obstruction is normal caliber. Normal caliber colon without wall thickening. Colonic diverticulosis without diverticulitis. Stomach is within normal limits. Normal appendix. Vascular/Lymphatic: Advanced aortic atherosclerotic calcification. No lymphadenopathy. Reproductive: Enlarged prostate. Other: No free intraperitoneal air. Musculoskeletal: No acute fracture. IMPRESSION: 1. Closed loop small bowel obstruction with ischemic bowel in between the 2 transition points in the central mesentery. Emergent surgical consult is recommended. 2. 8.8 cm hypoattenuating lesion in the posterior right hepatic lobe. There is suggestion of nodular peripheral enhancement which would be compatible with a benign hemangioma. Correlation with prior imaging if available or more definitive evaluation with hepatic protocol MRI is recommended. 3. Small bilateral pleural effusions. Interlobular septal thickening and patchy ground-glass opacities in the lower lungs compatible with pulmonary edema. Aortic Atherosclerosis (ICD10-I70.0). Critical Value/emergent results were called by telephone at the time of interpretation on 11/14/2023 at 3:16 am to provider JAYSON PEREYRA , who verbally acknowledged these results. Electronically Signed   By: Norman Gatlin M.D.   On:  11/14/2023 03:17   DG Chest Portable 1 View Result Date: 11/14/2023 CLINICAL DATA:  Nausea EXAM: PORTABLE CHEST 1 VIEW COMPARISON:  10/21/2022 FINDINGS: Stable cardiomediastinal silhouette. Left chest wall CRT D. layering bilateral pleural effusions and associated airspace opacities. Pulmonary vascular congestion. No pneumothorax. IMPRESSION: Layering bilateral pleural effusions and associated atelectasis or pneumonia. Electronically Signed   By: Norman Gatlin M.D.   On: 11/14/2023 00:58   CUP PACEART REMOTE DEVICE CHECK Result Date: 10/21/2023 Scheduled remote reviewed. Normal device function.  HF diagnostics frequently abnormal this remote cycle Next remote 91 days. LA, CVRS   Microbiology: Recent Results (from the past 240 hours)  Resp panel by RT-PCR (RSV, Flu A&B, Covid) Anterior Nasal Swab     Status: None   Collection Time: 11/14/23  2:17 AM   Specimen: Anterior Nasal Swab  Result Value Ref Range Status   SARS Coronavirus 2 by RT PCR NEGATIVE NEGATIVE  Final   Influenza A by PCR NEGATIVE NEGATIVE Final   Influenza B by PCR NEGATIVE NEGATIVE Final    Comment: (NOTE) The Xpert Xpress SARS-CoV-2/FLU/RSV plus assay is intended as an aid in the diagnosis of influenza from Nasopharyngeal swab specimens and should not be used as a sole basis for treatment. Nasal washings and aspirates are unacceptable for Xpert Xpress SARS-CoV-2/FLU/RSV testing.  Fact Sheet for Patients: bloggercourse.com  Fact Sheet for Healthcare Providers: seriousbroker.it  This test is not yet approved or cleared by the [place]  FDA and has been authorized for detection and/or diagnosis of SARS-CoV-2 by FDA under an Emergency Use Authorization (EUA). This EUA will remain in effect (meaning this test can be used) for the duration of the COVID-19 declaration under Section 564(b)(1) of the Act, 21 U.S.C. section 360bbb-3(b)(1), unless the authorization is  terminated or revoked.     Resp Syncytial Virus by PCR NEGATIVE NEGATIVE Final    Comment: (NOTE) Fact Sheet for Patients: bloggercourse.com  Fact Sheet for Healthcare Providers: seriousbroker.it  This test is not yet approved or cleared by the [place]  FDA and has been authorized for detection and/or diagnosis of SARS-CoV-2 by FDA under an Emergency Use Authorization (EUA). This EUA will remain in effect (meaning this test can be used) for the duration of the COVID-19 declaration under Section 564(b)(1) of the Act, 21 U.S.C. section 360bbb-3(b)(1), unless the authorization is terminated or revoked.  Performed at [facility], [address] [place], [place] [address]      Labs: Basic Metabolic Panel: Recent Labs  Lab 11/13/23 2230 11/13/23 2241 11/14/23 9187 11/15/23 0108 11/16/23 0341 11/17/23 0642 11/18/23 0626  NA 138 139  --  140 141 141 140  K 4.2 4.2  --  4.6 4.0 3.6 4.1  CL 100 101  --  104 106 104 104  CO2 25  --   --  24 24 24 27   GLUCOSE 253* 243*  --  195* 164* 175* 179*  BUN 37* 37*  --  39* 40* 34* 28*  CREATININE 1.62* 1.50* 1.53* 1.73* 1.51* 1.32* 1.40*  CALCIUM  9.5  --   --  8.5* 8.6* 8.5* 8.4*   Liver Function Tests: Recent Labs  Lab 11/13/23 2230  AST 34  ALT 30  ALKPHOS 77  BILITOT 1.5*  PROT 7.3  ALBUMIN  4.0   Recent Labs  Lab 11/13/23 2230  LIPASE 27   No results for input(s): AMMONIA in the last 168 hours. CBC: Recent Labs  Lab 11/14/23 2208 11/15/23 0108 11/16/23 0341 11/17/23 0642 11/18/23 0626  WBC 8.3 8.4 8.8 8.8 6.7  HGB 10.7* 11.0* 10.3* 10.4* 10.6*  HCT 32.6* 34.3* 31.9* 32.2* 33.1*  MCV 91.8 92.7 92.5 92.0 91.4  PLT 135* 139* 127* 161 188   Cardiac Enzymes: No results for input(s): CKTOTAL, CKMB, CKMBINDEX, TROPONINI in the last 168 hours. BNP: BNP (last 3 results) Recent Labs    11/14/23 0229  BNP 1,274.6*    ProBNP (last 3  results) No results for input(s): PROBNP in the last 8760 hours.  CBG: Recent Labs  Lab 11/14/23 0624 11/14/23 0716 11/15/23 2008 11/17/23 0738 11/17/23 1649  GLUCAP 211* 220* 167* 159* 182*       Signed:  Sigurd Pac MD.  [facility] 11/18/2023, 9:29 AM

## 2023-11-18 NOTE — Progress Notes (Signed)
 Explained discharge instructions to patient. Reviewed follow up appointment and next medication administration times. Also reviewed education. Patient verbalized having an understanding for instructions given. All belongings are in the patient's possession. Will pick up TOC meds on the way to the discharge lounge. IV and telemetry were removed. CCMD was notified. No other needs verbalized. Transporting downstairs to the discharge lounge.

## 2023-11-18 NOTE — Care Management Important Message (Signed)
 Important Message  Patient Details  Name: Eduardo [name] MRN: [id number] Date of Birth: [birth year]   Important Message Given:     Patient left prior to IM delivery will mail a copy to the patient home address.    Nabeeha Badertscher 11/18/2023, 12:13 PM

## 2023-11-18 NOTE — Plan of Care (Signed)
   Problem: Activity: Goal: Risk for activity intolerance will decrease Outcome: Progressing   Problem: Nutrition: Goal: Adequate nutrition will be maintained Outcome: Progressing   Problem: Coping: Goal: Level of anxiety will decrease Outcome: Progressing

## 2023-11-18 NOTE — Plan of Care (Signed)

## 2023-11-18 NOTE — Care Management Important Message (Signed)
 Important Message  Patient Details  Name: Eduardo [name] MRN: [id number] Date of Birth: [birth year]   Important Message Given:  Yes - Medicare IM     Dorena Bodo 11/18/2023, 12:12 PM

## 2023-11-18 NOTE — Progress Notes (Signed)
    4 Days Post-Op  Subjective: Tolerating diet and having bowel function. Pain well controlled.   Objective: Vital signs in last 24 hours: Temp:  [97.6 F (36.4 C)-99.1 F (37.3 C)] 97.6 F (36.4 C) (01/09 0912) Pulse Rate:  [72-77] 77 (01/09 0912) Resp:  [18] 18 (01/09 0912) BP: (88-105)/(52-63) 91/52 (01/09 0912) SpO2:  [96 %-98 %] 98 % (01/09 0912) Last BM Date : 11/17/23  Intake/Output from previous day: No intake/output data recorded. Intake/Output this shift: No intake/output data recorded.  PE: Gen: NAD Abd: soft, no significant distention, appropriately tender, midline incision c/d/I with staples present. Lap site on L lateral side is clean with single staple in place.   Lab Results:  Recent Labs    11/17/23 0642 11/18/23 0626  WBC 8.8 6.7  HGB 10.4* 10.6*  HCT 32.2* 33.1*  PLT 161 188   BMET Recent Labs    11/17/23 0642 11/18/23 0626  NA 141 140  K 3.6 4.1  CL 104 104  CO2 24 27  GLUCOSE 175* 179*  BUN 34* 28*  CREATININE 1.32* 1.40*  CALCIUM  8.5* 8.4*   PT/INR No results for input(s): LABPROT, INR in the last 72 hours. CMP     Component Value Date/Time   NA 140 11/18/2023 0626   NA 144 10/19/2023 0000   K 4.1 11/18/2023 0626   CL 104 11/18/2023 0626   CO2 27 11/18/2023 0626   GLUCOSE 179 (H) 11/18/2023 0626   BUN 28 (H) 11/18/2023 0626   BUN 48 (H) 03/04/2023 1236   CREATININE 1.40 (H) 11/18/2023 0626   CREATININE 1.15 09/28/2011 1358   CALCIUM  8.4 (L) 11/18/2023 0626   PROT 7.3 11/13/2023 2230   PROT 6.3 04/08/2022 1326   ALBUMIN  4.0 11/13/2023 2230   ALBUMIN  3.6 (L) 04/08/2022 1326   AST 34 11/13/2023 2230   ALT 30 11/13/2023 2230   ALKPHOS 77 11/13/2023 2230   BILITOT 1.5 (H) 11/13/2023 2230   BILITOT 0.6 04/08/2022 1326   GFRNONAA 55 (L) 11/18/2023 0626   GFRAA 44 (L) 12/19/2020 1031   Lipase     Component Value Date/Time   LIPASE 27 11/13/2023 2230       Studies/Results: No results  found.   Anti-infectives: Anti-infectives (From admission, onward)    Start     Dose/Rate Route Frequency Ordered Stop   11/14/23 0330  piperacillin -tazobactam (ZOSYN ) IVPB 3.375 g        3.375 g 100 mL/hr over 30 Minutes Intravenous  Once 11/14/23 0326 11/14/23 0757   11/14/23 0115  cefTRIAXone  (ROCEPHIN ) 2 g in sodium chloride  0.9 % 100 mL IVPB        2 g 200 mL/hr over 30 Minutes Intravenous  Once 11/14/23 0105 11/14/23 0243        Assessment/Plan POD 4, s/p ex lap with SBR for closed loop SBO, Dr. Stevie 1/5 - tol CM diet  - ambulate TID, OOB into a chair - multi-modal pain control - no abx needed - path without concern for malignancy - stable for DC, follow up in AVS  FEN - CM diet  VTE - Lovenox  ID - none currently needed  - per TRH -  DM CHF CKD CAD HTN PAD    LOS: 4 days    Eduardo [name] , [facility] 11/18/2023, 10:10 AM Please see Amion for pager number during day hours 7:00am-4:30pm or 7:00am -11:30am on weekends

## 2023-11-19 ENCOUNTER — Telehealth: Payer: Self-pay

## 2023-11-19 NOTE — Transitions of Care (Post Inpatient/ED Visit) (Signed)
   11/19/2023  Name: MASTON WIGHT MRN: [id number] DOB: 01/30/1956  Today's TOC FU Call Status: Today's TOC FU Call Status:: Unsuccessful Call (1st Attempt) Unsuccessful Call (1st Attempt) Date: 11/19/23  Attempted to reach the patient regarding the most recent Inpatient/ED visit.  Follow Up Plan: Additional outreach attempts will be made to reach the patient to complete the Transitions of Care (Post Inpatient/ED visit) call.   Barnie Gowda RN, BSN, CCM RN Care Manager  Transitions of Care  [facility]  [phone]

## 2023-11-22 ENCOUNTER — Telehealth: Payer: Self-pay

## 2023-11-22 NOTE — Transitions of Care (Post Inpatient/ED Visit) (Signed)
 11/22/2023  Name: Eduardo [name] MRN: [id number] DOB: 09/21/1956  Today's TOC FU Call Status: Today's TOC FU Call Status:: Successful TOC FU Call Completed TOC FU Call Complete Date: 11/22/23 Patient's Name and Date of Birth confirmed.  Transition Care Management Follow-up Telephone Call Date of Discharge: 11/18/23 Discharge Facility: [facility]) Type of Discharge: Inpatient Admission Primary Inpatient Discharge Diagnosis:: Small Bowel Obstruction How have you been since you were released from the hospital?: Same (Patient notes he has had quite a bit of pain, manages with Tylenol  and Oxycodone .) Any questions or concerns?: No  Items Reviewed: Did you receive and understand the discharge instructions provided?: Yes Medications obtained,verified, and reconciled?: Yes (Medications Reviewed) Any new allergies since your discharge?: No Dietary orders reviewed?: No Do you have support at home?: Yes People in Home: significant other Name of Support/Comfort Primary Source: [name]  Medications Reviewed Today: Medications Reviewed Today     Reviewed by Fabian Sober, RN (Case Manager) on 11/22/23 at 1531  Med List Status: <None>   Medication Order Taking? Sig Documenting Provider Last Dose Status Informant  acetaminophen  (TYLENOL ) 325 MG tablet [id number] Yes Take 1-2 tablets (325-650 mg total) by mouth every 4 (four) hours as needed for mild pain. Setzer, Sandra J, PA-C Taking Active Self           Med Note BOYKIN HARMAN SAUNDERS   Tue Aug 24, 2023  9:15 AM)    Bioflavonoid Products (ESTER C PO) [id number] Yes Take 1,000 mg by mouth daily. [provider] Taking Active Self  Cholecalciferol  (VITAMIN D ) 2000 units CAPS [id number] Yes Take 2,000 Units by mouth daily. [provider] Taking Active Self  clopidogrel  (PLAVIX ) 75 MG tablet [id number] Yes TAKE 1 TABLET BY MOUTH DAILY Custovic, Sabina, DO Taking Active Self  ezetimibe  (ZETIA ) 10 MG tablet [id number] Yes TAKE 1  TABLET BY MOUTH DAILY Patwardhan, Manish J, MD Taking Active Self  gemfibrozil  (LOPID ) 600 MG tablet [id number] Yes Take 600 mg by mouth 2 (two) times daily before a meal. [provider] Taking Active Self           Med Note [name]   Sat Dec 28, 2020 12:47 PM)    HUMALOG KWIKPEN 100 UNIT/ML KiwkPen [id number] Yes Inject 3-10 Units into the skin 3 (three) times daily. Inject 01-1009 units into the skin three times a day, per sliding scale- based on BGL >100 [provider] Taking Active Self           Med Note BOYKIN HARMAN SAUNDERS   Tue Aug 24, 2023  9:16 AM)    Anselm Frizzle 300 MG CAPS [id number] Yes Take 300 mg by mouth daily. [provider] Taking Active Self           Med Note BEVERLEE LUCIENNE JULIANNA Stevan Jul 28, 2023  9:45 AM) 500mg   melatonin 10 MG TABS [id number] Yes Take 10 mg by mouth at bedtime. Rosendo Nena PARAS, PA-C Taking Active Self  MOUNJARO 10 MG/0.5ML Pen [id number] Yes Inject 10 mg into the skin once a week. [provider] Taking Active Self  Multiple Vitamin (MULTIVITAMIN WITH MINERALS) TABS tablet [id number] Yes Take 1 tablet by mouth daily. [provider] Taking Active Self           Med Note ROSANNE, APRIL   Wed Nov 08, 2019 11:05 AM)    nitroGLYCERIN  (NITROSTAT ) 0.4 MG SL tablet [id number] Yes Place 1 tablet (0.4 mg total) under the tongue every  5 (five) minutes as needed for chest pain. Thomasena Sherran BROCKS, PA-C Taking Active Self           Med Note (WHITE, [name] Repress Nov 14, 2023  4:06 AM) On hand if needed  oxyCODONE  (OXY IR/ROXICODONE ) 5 MG immediate release tablet [id number] Yes Take 1 tablet (5 mg total) by mouth every 6 (six) hours as needed for moderate pain (pain score 4-6) or severe pain (pain score 7-10). [name], MD Taking Active   Probiotic Product (PROBIOTIC-10 PO) [id number] Yes Take 1 capsule by mouth daily. [provider] Taking Active Self  rosuvastatin  (CRESTOR ) 20 MG tablet [id number] Yes Take 1  tablet (20 mg total) by mouth daily. Ladona Heinz, MD Taking Active Self           Med Note [name]   Dju Dec 28, 2020 12:36 PM)    sacubitril -valsartan  (ENTRESTO ) 24-26 MG [id number] Yes TAKE 1 TABLET BY MOUTH TWICE  DAILY Patwardhan, Newman PARAS, MD Taking Active Self  senna-docusate (SENOKOT-S) 8.6-50 MG tablet [id number] Yes Take 1 tablet by mouth at bedtime as needed for mild constipation. [name], MD Taking Active   sodium chloride  (OCEAN) 0.65 % SOLN nasal spray [id number] Yes Place 1 spray into both nostrils as needed for congestion. [provider] Taking Active Self  torsemide  (DEMADEX ) 20 MG tablet [id number] Yes Take 1 tablet (20 mg total) by mouth daily. Take additional one tablet by mouth as needed for swelling Patwardhan, Newman PARAS, MD Taking Active Self            Home Care and Equipment/Supplies: Were Home Health Services Ordered?: No Any new equipment or medical supplies ordered?: No  Functional Questionnaire: Do you need assistance with bathing/showering or dressing?: No Do you need assistance with meal preparation?: No Do you need assistance with eating?: No Do you have difficulty maintaining continence: No Do you need assistance with getting out of bed/getting out of a chair/moving?: No Do you have difficulty managing or taking your medications?: No  Follow up appointments reviewed: PCP Follow-up appointment confirmed?: NA Specialist Hospital Follow-up appointment confirmed?: Yes Date of Specialist follow-up appointment?: 11/29/23 Follow-Up Specialty Provider:: [facility] Do you need transportation to your follow-up appointment?: No Do you understand care options if your condition(s) worsen?: Yes-patient verbalized understanding  SDOH Interventions Today    Flowsheet Row Most Recent Value  SDOH Interventions   Food Insecurity Interventions Intervention Not Indicated  Housing Interventions Intervention Not Indicated  Transportation  Interventions Intervention Not Indicated  Utilities Interventions Intervention Not Indicated     Barnie Gowda RN, BSN, CCM RN Care Manager  Transitions of Care  [facility] [employer]  [phone]

## 2023-11-25 NOTE — Progress Notes (Signed)
Remote ICD transmission.   

## 2023-12-02 ENCOUNTER — Other Ambulatory Visit: Payer: Self-pay

## 2023-12-02 MED ORDER — CLOPIDOGREL BISULFATE 75 MG PO TABS: 75.0000 mg | ORAL_TABLET | Freq: Every day | ORAL | 2 refills | Status: DC

## 2023-12-10 DIAGNOSIS — K56609 Unspecified intestinal obstruction, unspecified as to partial versus complete obstruction: Secondary | ICD-10-CM | POA: Diagnosis not present

## 2023-12-10 DIAGNOSIS — N1831 Chronic kidney disease, stage 3a: Secondary | ICD-10-CM | POA: Diagnosis not present

## 2023-12-10 DIAGNOSIS — E1122 Type 2 diabetes mellitus with diabetic chronic kidney disease: Secondary | ICD-10-CM | POA: Diagnosis not present

## 2023-12-10 DIAGNOSIS — I129 Hypertensive chronic kidney disease with stage 1 through stage 4 chronic kidney disease, or unspecified chronic kidney disease: Secondary | ICD-10-CM | POA: Diagnosis not present

## 2023-12-10 DIAGNOSIS — D631 Anemia in chronic kidney disease: Secondary | ICD-10-CM | POA: Diagnosis not present

## 2023-12-10 DIAGNOSIS — E1129 Type 2 diabetes mellitus with other diabetic kidney complication: Secondary | ICD-10-CM | POA: Diagnosis not present

## 2023-12-10 DIAGNOSIS — I5022 Chronic systolic (congestive) heart failure: Secondary | ICD-10-CM | POA: Diagnosis not present

## 2023-12-10 DIAGNOSIS — E785 Hyperlipidemia, unspecified: Secondary | ICD-10-CM | POA: Diagnosis not present

## 2023-12-10 DIAGNOSIS — N189 Chronic kidney disease, unspecified: Secondary | ICD-10-CM | POA: Diagnosis not present

## 2023-12-15 ENCOUNTER — Encounter (INDEPENDENT_AMBULATORY_CARE_PROVIDER_SITE_OTHER): Payer: Medicare Other | Admitting: Ophthalmology

## 2023-12-15 DIAGNOSIS — E113512 Type 2 diabetes mellitus with proliferative diabetic retinopathy with macular edema, left eye: Secondary | ICD-10-CM | POA: Diagnosis not present

## 2023-12-15 DIAGNOSIS — Z794 Long term (current) use of insulin: Secondary | ICD-10-CM

## 2023-12-15 DIAGNOSIS — E113391 Type 2 diabetes mellitus with moderate nonproliferative diabetic retinopathy without macular edema, right eye: Secondary | ICD-10-CM

## 2023-12-15 DIAGNOSIS — H35033 Hypertensive retinopathy, bilateral: Secondary | ICD-10-CM | POA: Diagnosis not present

## 2023-12-15 DIAGNOSIS — I10 Essential (primary) hypertension: Secondary | ICD-10-CM | POA: Diagnosis not present

## 2023-12-15 DIAGNOSIS — H43813 Vitreous degeneration, bilateral: Secondary | ICD-10-CM

## 2023-12-23 ENCOUNTER — Other Ambulatory Visit: Payer: Self-pay | Admitting: Nephrology

## 2023-12-23 DIAGNOSIS — D1803 Hemangioma of intra-abdominal structures: Secondary | ICD-10-CM

## 2023-12-29 ENCOUNTER — Other Ambulatory Visit (HOSPITAL_COMMUNITY): Payer: Self-pay | Admitting: Nephrology

## 2023-12-29 DIAGNOSIS — D1803 Hemangioma of intra-abdominal structures: Secondary | ICD-10-CM

## 2023-12-31 NOTE — Progress Notes (Signed)
 Subjective:    Patient ID: Eduardo [name], male    DOB: 05-Jul-1956, 68 y.o.   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   Stools are "fine" back to somewhat normal- a little looser-    Took a LOT more out of him- to get back to normal. Back to walking normal distances.      [facility] would like an Rx for liners. Since his is breaking down,   Current liner 02/2023- but already has breakdown in liner  A1c 6.6- staying under control.   Not taking Tramadol anymore.  Can use Tylenol for irritation. Or aspercreme.    Pain Inventory Average Pain 0 Pain Right Now 0 My pain is  no pain  In the last 24 hours, has pain interfered with the following? General activity 0 Relation with others 0 Enjoyment of life 0 What TIME of day is your pain at its worst? No pain Sleep (in general) Fair  Pain is worse with:  no pain Pain improves with:  no pain Relief from Meds:  no pain med needed  Family History  Problem Relation Age of Onset   Diabetes Mother    Hearing loss Mother    Hypertension Mother    Hyperlipidemia Mother    Heart disease Mother    Varicose Veins Mother    Varicose Veins Father    Dementia Father    Hyperlipidemia Brother    Diabetes Maternal Grandmother    Social History   Socioeconomic History   Marital status: Widowed    Spouse name: Not on file   Number of children: 0   Years of education: Not on file   Highest education level: Not on file  Occupational History   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 Retired. 1 cat.  Girlfriend and her daughter lives with him. Enjoys fishing.   Updated 06/2021   Social Drivers of Health   Financial Resource Strain: Low Risk  (07/28/2023)   Overall Financial Resource Strain (CARDIA)    Difficulty of Paying Living Expenses: Not hard at all  Food Insecurity: No Food Insecurity (11/22/2023)   Hunger Vital Sign    Worried About Running Out of Food in the Last Year: Never true    Ran Out of Food in the Last Year: Never true  Transportation Needs: No Transportation Needs (11/22/2023)   PRAPARE - Administrator, Civil Service (Medical): No    Lack of Transportation (Non-Medical): No  Physical  Activity: Sufficiently Active (07/28/2023)   Exercise Vital Sign    Days of Exercise per Week: 7 days    Minutes of Exercise per Session: 90 min  Stress: No Stress Concern Present (07/28/2023)   [employer] of Occupational Health - Occupational Stress Questionnaire    Feeling of Stress : Not at all  Social Connections: Socially Isolated (11/14/2023)   Social Connection and Isolation Panel [NHANES]    Frequency of Communication with Friends and Family: Never    Frequency of Social Gatherings with Friends and Family: Never    Attends Religious Services: Never    Database administrator or Organizations: No    Attends Banker Meetings: Never    Marital Status: Married   Past Surgical History:  Procedure Laterality Date   ABDOMINAL AORTAGRAM N/A 04/18/2012   Procedure: ABDOMINAL Ronny Flurry;  Surgeon: Chuck Hint, MD;  Location: [facility] CATH LAB;  Service: Cardiovascular;  Laterality: N/A;    AMPUTATION Right 05/19/2019   Procedure: RIGHT FOOT 5TH RAY AMPUTATION;  Surgeon: Nadara Mustard, MD;  Location: [facility] OR;  Service: Orthopedics;  Laterality: Right;   AMPUTATION Right 03/11/2022   Procedure: RIGHT LEG DEBRIDEMENT VS. BELOW KNEE AMPUTATION;  Surgeon: Nadara Mustard, MD;  Location: [facility] OR;  Service: Orthopedics;  Laterality: Right;   BIV ICD INSERTION CRT-D N/A 10/21/2022   Procedure: BIV ICD INSERTION CRT-D;  Surgeon: Marinus Maw, MD;  Location: [facility] INVASIVE CV LAB;  Service: Cardiovascular;  Laterality: N/A;   BOWEL RESECTION N/A 11/14/2023   Procedure: SMALL BOWEL RESECTION;  Surgeon: Sheliah Hatch, De Blanch, MD;  Location: [facility] OR;  Service: General;  Laterality: N/A;   CARDIAC CATHETERIZATION N/A 09/08/2016   Procedure: Left Heart Cath and Coronary Angiography;  Surgeon: Yates Decamp, MD;  Location: [facility] INVASIVE CV LAB;  Service: Cardiovascular;  Laterality: N/A;   CATARACT EXTRACTION, BILATERAL  09/2017, 10/2017   Dr. Elmer Picker   COLONOSCOPY W/ BIOPSIES AND POLYPECTOMY     CORONARY/GRAFT ACUTE MI REVASCULARIZATION N/A 10/11/2020   Procedure: Coronary/Graft Acute MI Revascularization;  Surgeon: Yates Decamp, MD;  Location: [facility] INVASIVE CV LAB;  Service: Cardiovascular;  Laterality: N/A;   I & D EXTREMITY Right 03/11/2022   Procedure: BELOW KNEE AMPUTATION;  Surgeon: Nadara Mustard, MD;  Location: [facility] OR;  Service: Orthopedics;  Laterality: Right;   INSERT / REPLACE / REMOVE PACEMAKER     LAPAROSCOPY N/A 11/14/2023   Procedure: LAPAROSCOPY DIAGNOSTIC;  Surgeon: Sheliah Hatch, De Blanch, MD;  Location: [facility] OR;  Service: General;  Laterality: N/A;  Converted to open ex-lap after inserting first trocar   LAPAROTOMY N/A 11/14/2023   Procedure: EXPLORATORY LAPAROTOMY;  Surgeon: Sheliah Hatch De Blanch, MD;  Location: [facility] OR;  Service: General;  Laterality: N/A;   LEFT HEART CATH N/A 12/31/2020   Procedure: Left Heart Cath;  Surgeon: Yates Decamp, MD;  Location: [facility] INVASIVE CV LAB;  Service: Cardiovascular;   Laterality: N/A;   LEFT HEART CATH AND CORONARY ANGIOGRAPHY N/A 10/11/2020   Procedure: LEFT HEART CATH AND CORONARY ANGIOGRAPHY;  Surgeon: Yates Decamp, MD;  Location: [facility] INVASIVE CV LAB;  Service: Cardiovascular;  Laterality: N/A;   LOWER EXTREMITY ANGIOGRAM Bilateral 04/18/2012   Procedure: LOWER EXTREMITY ANGIOGRAM;  Surgeon: Chuck Hint, MD;  Location: [facility] CATH LAB;  Service: Cardiovascular;  Laterality: Bilateral;  bilat lower extrem angio   LOWER EXTREMITY ANGIOGRAPHY Bilateral 05/02/2019   Procedure: LOWER EXTREMITY ANGIOGRAPHY;  Surgeon: Yates Decamp, MD;  Location: [facility] INVASIVE CV LAB;  Service: Cardiovascular;  Laterality: Bilateral;   LOWER EXTREMITY ANGIOGRAPHY Bilateral 02/28/2019   Procedure: LOWER EXTREMITY ANGIOGRAPHY;  Surgeon: Yates Decamp, MD;  Location: [facility] INVASIVE CV LAB;  Service: Cardiovascular;  Laterality: Bilateral;   macular photocoagulation     (eye treatments for diabetic retinopathy)-Dr. Ashley Royalty   PERIPHERAL VASCULAR INTERVENTION  02/28/2019   Procedure: PERIPHERAL VASCULAR INTERVENTION;  Surgeon: Yates Decamp, MD;  Location: [facility] INVASIVE CV LAB;  Service: Cardiovascular;;   TEE WITHOUT CARDIOVERSION N/A 03/18/2022   Procedure: TRANSESOPHAGEAL ECHOCARDIOGRAM (TEE);  Surgeon: Yates Decamp, MD;  Location: [facility] ENDOSCOPY;  Service: Cardiovascular;  Laterality: N/A;   VENTRICULAR ASSIST DEVICE INSERTION N/A 12/31/2020   Procedure: VENTRICULAR ASSIST DEVICE INSERTION;  Surgeon: Yates Decamp, MD;  Location: [facility] INVASIVE CV LAB;  Service: Cardiovascular;  Laterality: N/A;   Past Surgical History:  Procedure Laterality Date   ABDOMINAL AORTAGRAM N/A 04/18/2012   Procedure: ABDOMINAL AORTAGRAM;  Surgeon: Chuck Hint, MD;  Location: [facility] CATH LAB;  Service: Cardiovascular;  Laterality: N/A;   AMPUTATION Right 05/19/2019   Procedure: RIGHT FOOT 5TH RAY AMPUTATION;  Surgeon: Nadara Mustard, MD;  Location: [facility] OR;  Service: Orthopedics;  Laterality: Right;   AMPUTATION Right  03/11/2022   Procedure: RIGHT LEG DEBRIDEMENT VS. BELOW KNEE AMPUTATION;  Surgeon: Nadara Mustard, MD;  Location: [facility] OR;  Service: Orthopedics;  Laterality: Right;   BIV ICD INSERTION CRT-D N/A 10/21/2022   Procedure: BIV ICD INSERTION CRT-D;  Surgeon: Marinus Maw, MD;  Location: [facility] INVASIVE CV LAB;  Service: Cardiovascular;  Laterality: N/A;   BOWEL RESECTION N/A 11/14/2023   Procedure: SMALL BOWEL RESECTION;  Surgeon: Sheliah Hatch, De Blanch, MD;  Location: [facility] OR;  Service: General;  Laterality: N/A;   CARDIAC CATHETERIZATION N/A 09/08/2016   Procedure: Left Heart Cath and Coronary Angiography;  Surgeon: Yates Decamp, MD;  Location: [facility] INVASIVE CV LAB;  Service: Cardiovascular;  Laterality: N/A;   CATARACT EXTRACTION, BILATERAL  09/2017, 10/2017   Dr. Elmer Picker   COLONOSCOPY W/ BIOPSIES AND POLYPECTOMY     CORONARY/GRAFT ACUTE MI REVASCULARIZATION N/A 10/11/2020   Procedure: Coronary/Graft Acute MI Revascularization;  Surgeon: Yates Decamp, MD;  Location: [facility] INVASIVE CV LAB;  Service: Cardiovascular;  Laterality: N/A;   I & D EXTREMITY Right 03/11/2022   Procedure: BELOW KNEE AMPUTATION;  Surgeon: Nadara Mustard, MD;  Location: [facility] OR;  Service: Orthopedics;  Laterality: Right;   INSERT / REPLACE / REMOVE PACEMAKER     LAPAROSCOPY N/A 11/14/2023   Procedure: LAPAROSCOPY DIAGNOSTIC;  Surgeon: Sheliah Hatch, De Blanch, MD;  Location: [facility] OR;  Service: General;  Laterality: N/A;  Converted to open ex-lap after inserting first trocar   LAPAROTOMY N/A 11/14/2023   Procedure: EXPLORATORY LAPAROTOMY;  Surgeon: Sheliah Hatch De Blanch, MD;  Location: [facility] OR;  Service: General;  Laterality: N/A;   LEFT HEART CATH N/A 12/31/2020   Procedure: Left Heart Cath;  Surgeon: Yates Decamp, MD;  Location: [facility] INVASIVE CV LAB;  Service: Cardiovascular;  Laterality: N/A;   LEFT HEART CATH AND CORONARY ANGIOGRAPHY N/A 10/11/2020   Procedure: LEFT HEART CATH AND CORONARY ANGIOGRAPHY;  Surgeon: Yates Decamp, MD;  [facility] INVASIVE CV  LAB;  Service: Cardiovascular;  Laterality: N/A;   LOWER EXTREMITY ANGIOGRAM Bilateral 04/18/2012   Procedure: LOWER EXTREMITY ANGIOGRAM;  Surgeon: Chuck Hint, MD;  Location: [facility] CATH LAB;  Service: Cardiovascular;  Laterality: Bilateral;  bilat lower extrem angio   LOWER EXTREMITY ANGIOGRAPHY Bilateral 05/02/2019   Procedure: LOWER EXTREMITY ANGIOGRAPHY;  Surgeon: Yates Decamp, MD;  Location:  [facility] INVASIVE CV LAB;  Service: Cardiovascular;  Laterality: Bilateral;   LOWER EXTREMITY ANGIOGRAPHY Bilateral 02/28/2019   Procedure: LOWER EXTREMITY ANGIOGRAPHY;  Surgeon: Yates Decamp, MD;  Location: [facility] INVASIVE CV LAB;  Service: Cardiovascular;  Laterality: Bilateral;   macular photocoagulation     (eye treatments for diabetic retinopathy)-Dr. Ashley Royalty   PERIPHERAL VASCULAR INTERVENTION  02/28/2019   Procedure: PERIPHERAL VASCULAR INTERVENTION;  Surgeon: Yates Decamp, MD;  Location: [facility] INVASIVE CV LAB;  Service: Cardiovascular;;   TEE WITHOUT CARDIOVERSION N/A 03/18/2022   Procedure: TRANSESOPHAGEAL ECHOCARDIOGRAM (TEE);  Surgeon: Yates Decamp, MD;  Location: [facility] ENDOSCOPY;  Service: Cardiovascular;  Laterality: N/A;   VENTRICULAR ASSIST DEVICE INSERTION N/A 12/31/2020   Procedure: VENTRICULAR ASSIST DEVICE INSERTION;  Surgeon: Yates Decamp, MD;  Location: [facility] INVASIVE CV LAB;  Service: Cardiovascular;  Laterality: N/A;   Past Medical History:  Diagnosis Date   CHF (congestive heart failure) (HCC)    Chronic kidney disease    stage 3   Colon polyp    Coronary artery disease    Diabetes mellitus 1987   under care of Dr. Talmage Nap.  On insulin since 96 (off and on)   Diabetic retinopathy    Dupuytren contracture    R hand, s/p injection (Dr. Izora Ribas)   Essential hypertension, benign    Essential hypertension, benign 02/06/2019   Frequency of urination and polyuria    Hypertension    Myocardial infarction [facility])    denies   Neuromuscular disorder (HCC)    Diabetic neuropathy   Osteomyelitis (HCC)     right foot   Other testicular hypofunction    Peripheral arterial disease (HCC) 10/28/2012   Peritoneal abscess (HCC) 6/08   and buttock.   Pneumonia    Polydipsia    Proteinuria    Pure hyperglyceridemia    Subacute osteomyelitis, right ankle and foot (HCC)    Wears glasses    BP 91/64   Pulse 100   Ht 5\' 9"  (1.753 m)   Wt 232 lb (105.2 kg)   SpO2 98%   BMI 34.26 kg/m   Opioid Risk Score:   Fall Risk Score:  `1  Depression screen [facility] 2/9     01/03/2024   10:22 AM 07/28/2023    9:40 AM 09/18/2022   10:58 AM 07/06/2022    9:42 AM 05/18/2022   11:28 AM 06/26/2021    8:45 AM 06/24/2020    8:40 AM  Depression screen PHQ 2/9  Decreased Interest 0 0 0 0 0 0 0  Down, Depressed, Hopeless 0 0 0 0 0 0 0  PHQ - 2 Score 0 0 0 0 0 0 0  Altered sleeping     0    Tired, decreased energy     0    Change in appetite     0    Feeling bad or failure about yourself      0    Trouble concentrating     0    Moving slowly or fidgety/restless     0    Suicidal thoughts     0    PHQ-9 Score     0      Review of Systems  Musculoskeletal:  Positive for gait problem.       Right leg prosthetic  All other systems reviewed and are negative.      Objective:   Physical Exam  Awake, alert, appropriate, using R BKA ot walk with no other assistive device, NAD Wearing large bandaid-  Skin barely open on medial aspect of tibial bone protrusion- which sticks out/protrudes out from skin covering R BKA.        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 Written Rx given Tibial bone protrusion is breaking down the gel liner and causing irritation and some skin breakdown- he needs new liners- will write Rx for 2 liners- so hopefully can switch off.    2 a Risk factor for bowel obstruction- constipation.    3. Will try  Lidocaine 5% -  will give Rx-  5% up to 4x/day- as needed for limb pain.  Uses at night only so doesn't mess with gel lining.   4. F/U in 6 months- on R BKA

## 2024-01-03 ENCOUNTER — Encounter
Payer: Medicare Other | Attending: Physical Medicine and Rehabilitation | Admitting: Physical Medicine and Rehabilitation

## 2024-01-03 ENCOUNTER — Encounter: Payer: Self-pay | Admitting: Physical Medicine and Rehabilitation

## 2024-01-03 VITALS — BP 91/64 | HR 100 | Ht 69.0 in | Wt 232.0 lb

## 2024-01-03 DIAGNOSIS — G546 Phantom limb syndrome with pain: Secondary | ICD-10-CM | POA: Diagnosis not present

## 2024-01-03 DIAGNOSIS — Z89511 Acquired absence of right leg below knee: Secondary | ICD-10-CM | POA: Insufficient documentation

## 2024-01-03 MED ORDER — LIDOCAINE 5 % EX OINT
1.0000 | TOPICAL_OINTMENT | CUTANEOUS | 5 refills | Status: DC | PRN
Start: 2024-01-03 — End: 2024-06-30

## 2024-01-03 NOTE — Patient Instructions (Signed)
 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  Tibial bone protrusion is breaking down the gel liner and causing irritation and some skin breakdown- he needs new liners- will write Rx for 2 liners- so hopefully can switch off.    2 a Risk factor for bowel obstruction- constipation.    3. Will try  Lidocaine 5% - will give Rx-  5% up to 4x/day- as needed for limb pain.  Uses at night only so doesn't mess with gel lining.   4. F/U in 6 months- on R BKA

## 2024-01-04 DIAGNOSIS — E1165 Type 2 diabetes mellitus with hyperglycemia: Secondary | ICD-10-CM | POA: Diagnosis not present

## 2024-01-20 ENCOUNTER — Ambulatory Visit: Payer: Medicare Other

## 2024-01-20 ENCOUNTER — Encounter: Payer: Self-pay | Admitting: Internal Medicine

## 2024-01-20 DIAGNOSIS — I447 Left bundle-branch block, unspecified: Secondary | ICD-10-CM | POA: Diagnosis not present

## 2024-01-20 LAB — CUP PACEART REMOTE DEVICE CHECK
Battery Remaining Longevity: 71 mo
Battery Remaining Percentage: 78 %
Battery Voltage: 2.98 V
Brady Statistic AP VP Percent: 5.9 %
Brady Statistic AP VS Percent: 1 %
Brady Statistic AS VP Percent: 90 %
Brady Statistic AS VS Percent: 1.6 %
Brady Statistic RA Percent Paced: 3.4 %
Date Time Interrogation Session: 20250313030229
HighPow Impedance: 38 Ohm
Implantable Lead Connection Status: 753985
Implantable Lead Connection Status: 753985
Implantable Lead Connection Status: 753985
Implantable Lead Implant Date: 20231213
Implantable Lead Implant Date: 20231213
Implantable Lead Implant Date: 20231213
Implantable Lead Location: 753858
Implantable Lead Location: 753859
Implantable Lead Location: 753860
Implantable Pulse Generator Implant Date: 20231213
Lead Channel Impedance Value: 310 Ohm
Lead Channel Impedance Value: 330 Ohm
Lead Channel Impedance Value: 650 Ohm
Lead Channel Pacing Threshold Amplitude: 0.75 V
Lead Channel Pacing Threshold Amplitude: 0.75 V
Lead Channel Pacing Threshold Amplitude: 1 V
Lead Channel Pacing Threshold Pulse Width: 0.5 ms
Lead Channel Pacing Threshold Pulse Width: 0.5 ms
Lead Channel Pacing Threshold Pulse Width: 0.5 ms
Lead Channel Sensing Intrinsic Amplitude: 11.9 mV
Lead Channel Sensing Intrinsic Amplitude: 4.7 mV
Lead Channel Setting Pacing Amplitude: 2 V
Lead Channel Setting Pacing Amplitude: 2 V
Lead Channel Setting Pacing Amplitude: 2 V
Lead Channel Setting Pacing Pulse Width: 0.5 ms
Lead Channel Setting Pacing Pulse Width: 0.5 ms
Lead Channel Setting Sensing Sensitivity: 0.5 mV
Pulse Gen Serial Number: 211000872

## 2024-01-24 DIAGNOSIS — Z89511 Acquired absence of right leg below knee: Secondary | ICD-10-CM | POA: Diagnosis not present

## 2024-01-26 ENCOUNTER — Encounter: Payer: Self-pay | Admitting: Family Medicine

## 2024-02-04 ENCOUNTER — Ambulatory Visit (HOSPITAL_COMMUNITY)
Admission: RE | Admit: 2024-02-04 | Discharge: 2024-02-04 | Disposition: A | Discharge status: Home / Self Care | Payer: Medicare Other | Source: Ambulatory Visit | Attending: Nephrology | Admitting: Nephrology

## 2024-02-04 DIAGNOSIS — R188 Other ascites: Secondary | ICD-10-CM | POA: Diagnosis not present

## 2024-02-04 DIAGNOSIS — D1803 Hemangioma of intra-abdominal structures: Secondary | ICD-10-CM | POA: Insufficient documentation

## 2024-02-04 DIAGNOSIS — I700 Atherosclerosis of aorta: Secondary | ICD-10-CM | POA: Diagnosis not present

## 2024-02-04 DIAGNOSIS — R161 Splenomegaly, not elsewhere classified: Secondary | ICD-10-CM | POA: Diagnosis not present

## 2024-02-04 MED ORDER — GADOBUTROL 1 MMOL/ML IV SOLN
10.0000 mL | Freq: Once | INTRAVENOUS | Status: AC | PRN
  Administered 2024-02-04: 10 mL via INTRAVENOUS

## 2024-02-09 ENCOUNTER — Other Ambulatory Visit: Payer: Self-pay | Admitting: General Surgery

## 2024-02-09 DIAGNOSIS — Z79899 Other long term (current) drug therapy: Secondary | ICD-10-CM | POA: Diagnosis not present

## 2024-02-09 DIAGNOSIS — R5383 Other fatigue: Secondary | ICD-10-CM | POA: Diagnosis not present

## 2024-02-09 DIAGNOSIS — Z9049 Acquired absence of other specified parts of digestive tract: Secondary | ICD-10-CM | POA: Diagnosis not present

## 2024-02-09 DIAGNOSIS — E440 Moderate protein-calorie malnutrition: Secondary | ICD-10-CM | POA: Diagnosis not present

## 2024-02-09 DIAGNOSIS — R1084 Generalized abdominal pain: Secondary | ICD-10-CM

## 2024-02-11 DIAGNOSIS — N1831 Chronic kidney disease, stage 3a: Secondary | ICD-10-CM | POA: Diagnosis not present

## 2024-02-11 DIAGNOSIS — R160 Hepatomegaly, not elsewhere classified: Secondary | ICD-10-CM | POA: Diagnosis not present

## 2024-02-11 DIAGNOSIS — E1129 Type 2 diabetes mellitus with other diabetic kidney complication: Secondary | ICD-10-CM | POA: Diagnosis not present

## 2024-02-11 DIAGNOSIS — R1909 Other intra-abdominal and pelvic swelling, mass and lump: Secondary | ICD-10-CM | POA: Diagnosis not present

## 2024-02-11 DIAGNOSIS — E1122 Type 2 diabetes mellitus with diabetic chronic kidney disease: Secondary | ICD-10-CM | POA: Diagnosis not present

## 2024-02-11 DIAGNOSIS — E785 Hyperlipidemia, unspecified: Secondary | ICD-10-CM | POA: Diagnosis not present

## 2024-02-11 DIAGNOSIS — I5022 Chronic systolic (congestive) heart failure: Secondary | ICD-10-CM | POA: Diagnosis not present

## 2024-02-11 DIAGNOSIS — C220 Liver cell carcinoma: Secondary | ICD-10-CM | POA: Diagnosis not present

## 2024-02-12 LAB — HM HEPATITIS C SCREENING LAB: HM Hepatitis Screen: NEGATIVE

## 2024-02-12 LAB — LAB REPORT - SCANNED: EGFR: 54

## 2024-02-14 ENCOUNTER — Ambulatory Visit
Admission: RE | Admit: 2024-02-14 | Discharge: 2024-02-14 | Disposition: A | Discharge status: Home / Self Care | Source: Ambulatory Visit | Attending: General Surgery

## 2024-02-14 DIAGNOSIS — K7689 Other specified diseases of liver: Secondary | ICD-10-CM | POA: Diagnosis not present

## 2024-02-14 DIAGNOSIS — R140 Abdominal distension (gaseous): Secondary | ICD-10-CM | POA: Diagnosis not present

## 2024-02-14 DIAGNOSIS — R160 Hepatomegaly, not elsewhere classified: Secondary | ICD-10-CM | POA: Diagnosis not present

## 2024-02-14 DIAGNOSIS — R1084 Generalized abdominal pain: Secondary | ICD-10-CM

## 2024-02-14 DIAGNOSIS — C221 Intrahepatic bile duct carcinoma: Secondary | ICD-10-CM | POA: Diagnosis not present

## 2024-02-14 DIAGNOSIS — I700 Atherosclerosis of aorta: Secondary | ICD-10-CM | POA: Diagnosis not present

## 2024-02-14 DIAGNOSIS — R109 Unspecified abdominal pain: Secondary | ICD-10-CM | POA: Diagnosis not present

## 2024-02-14 MED ORDER — IOPAMIDOL (ISOVUE-300) INJECTION 61%
100.0000 mL | Freq: Once | INTRAVENOUS | Status: AC | PRN
  Administered 2024-02-14: 100 mL via INTRAVENOUS

## 2024-02-22 ENCOUNTER — Inpatient Hospital Stay

## 2024-02-22 ENCOUNTER — Inpatient Hospital Stay: Attending: Oncology | Admitting: Oncology

## 2024-02-22 ENCOUNTER — Other Ambulatory Visit: Payer: Self-pay | Admitting: *Deleted

## 2024-02-22 VITALS — BP 89/61 | HR 97 | Temp 98.2°F | Resp 18 | Ht 69.0 in | Wt 223.7 lb

## 2024-02-22 DIAGNOSIS — R160 Hepatomegaly, not elsewhere classified: Secondary | ICD-10-CM | POA: Diagnosis not present

## 2024-02-22 NOTE — Progress Notes (Signed)
 [facility] New Patient Consult   Requesting MD: Roosvelt Colla, Md [address] [place],  [place] [address]   Eduardo [name] 68 y.o.  [birth year]    Reason for Consult: Liver mass   HPI: [name] was admitted 11/14/2023 with acute onset nausea/vomiting and abdominal pain.  A CT abdomen/pelvis revealed 2 adjacent transition points in the central mesentery with dilated upstream small bowel and concern for ischemia between the transition points.  An 8.8 cm hypoattenuation mass was noted in the posterior right hepatic lobe with suggestion of nodular peripheral enhancement compatible with a hemangioma.  Further evaluation with a hepatic protocol MRI was recommended. He was taken to the operating room by Dr. Bishop Bullock for a diagnostic laparoscopy and small bowel resection 11/14/2023.  3 feet of bowel was noted to be "red, inflamed, and dilated ".  There was an adhesion of omentum.  The small bowel was resected and the pathology returned negative for malignancy.  He was discharged to home 11/18/2023.  An MRI abdomen on 02/04/2024 revealed left greater than right pleural effusions.  A rim-enhancing centrally hypoattenuating mass was noted in the posterior right liver measuring 11 x 8.4 cm, increased compared to the January CT.  Mild splenomegaly measuring 13.4 cm.  No lymphadenopathy.  Severe anasarca was noted with small volume perihepatic ascites.  The mass was felt to be consistent with a necrotic metastasis or a primary liver mass.  A repeat CT abdomen/pelvis 02/14/2024 to evaluate abdominal "bloating "revealed no significant change in the right liver mass.  No enlarged abdominal pelvic lymph nodes.  There is diffuse soft tissue edema of the anterior abdominal wall.  No ascites.  Small left greater than right pleural effusions.  Mr. Vandeusen is referred by Dr. Christianne Cowper.  He underwent a laboratory evaluation 02/15/2024.  This included a CA 19-9 of 9 of less than 2, AFP 7.1 (0-8.4), CA125 261,  creatinine 1.41, 128, negative CRNA, hepatitis B surface antigen, negative hepatitis B core antibody, negative hepatitis B surface antibody.   Past Medical History:  Diagnosis Date   CHF (congestive heart failure) (HCC)    Chronic kidney disease    stage 3   Colon polyp    Coronary artery disease    Diabetes mellitus 1987   under care of Dr. [name].  On insulin since 96 (off and on)   Diabetic retinopathy    Dupuytren contracture    R hand, s/p injection (Dr. Jonna Netter)   Essential hypertension, benign    Essential hypertension, benign 02/06/2019   Frequency of urination and polyuria    Hypertension    Myocardial infarction [facility])    denies   Neuromuscular disorder (HCC)    Diabetic neuropathy   Osteomyelitis (HCC)    right foot   Other testicular hypofunction    Peripheral arterial disease (HCC) 10/28/2012   Peritoneal abscess (HCC) 6/08   and buttock.   Pneumonia    Polydipsia    Proteinuria    Pure hyperglyceridemia    Subacute osteomyelitis, right ankle and foot (HCC)    Wears glasses     Past Surgical History:  Procedure Laterality Date   ABDOMINAL AORTAGRAM N/A 04/18/2012   Procedure: ABDOMINAL Tommi Fraise;  Surgeon: Dannis Dy, MD;  Location: [facility] CATH LAB;  Service: Cardiovascular;  Laterality: N/A;   AMPUTATION Right 05/19/2019   Procedure: RIGHT FOOT 5TH RAY AMPUTATION;  Surgeon: Timothy Ford, MD;  Location: [facility] OR;  Service: Orthopedics;  Laterality: Right;   AMPUTATION Right  03/11/2022   Procedure: RIGHT LEG DEBRIDEMENT VS. BELOW KNEE AMPUTATION;  Surgeon: Timothy Ford, MD;  Location: [facility] OR;  Service: Orthopedics;  Laterality: Right;   BIV ICD INSERTION CRT-D N/A 10/21/2022   Procedure: BIV ICD INSERTION CRT-D;  Surgeon: Tammie Fall, MD;  Location: [facility] INVASIVE CV LAB;  Service: Cardiovascular;  Laterality: N/A;   BOWEL RESECTION N/A 11/14/2023   Procedure: SMALL BOWEL RESECTION;  Surgeon: Dorrie Gaudier, Alphonso Aschoff, MD;  Location: [facility] OR;  Service:  General;  Laterality: N/A;   CARDIAC CATHETERIZATION N/A 09/08/2016   Procedure: Left Heart Cath and Coronary Angiography;  Surgeon: Knox Perl, MD;  Location: [facility] INVASIVE CV LAB;  Service: Cardiovascular;  Laterality: N/A;   CATARACT EXTRACTION, BILATERAL  09/2017, 10/2017   Dr. Lasandra Points   COLONOSCOPY W/ BIOPSIES AND POLYPECTOMY     CORONARY/GRAFT ACUTE MI REVASCULARIZATION N/A 10/11/2020   Procedure: Coronary/Graft Acute MI Revascularization;  Surgeon: Knox Perl, MD;  Location: [facility] INVASIVE CV LAB;  Service: Cardiovascular;  Laterality: N/A;   I & D EXTREMITY Right 03/11/2022   Procedure: BELOW KNEE AMPUTATION;  Surgeon: Timothy Ford, MD;  Location: [facility] OR;  Service: Orthopedics;  Laterality: Right;   INSERT / REPLACE / REMOVE PACEMAKER     LAPAROSCOPY N/A 11/14/2023   Procedure: LAPAROSCOPY DIAGNOSTIC;  Surgeon: Dorrie Gaudier, Alphonso Aschoff, MD;  Location: [facility] OR;  Service: General;  Laterality: N/A;  Converted to open ex-lap after inserting first trocar   LAPAROTOMY N/A 11/14/2023   Procedure: EXPLORATORY LAPAROTOMY;  Surgeon: Dorrie Gaudier Alphonso Aschoff, MD;  Location: [facility] OR;  Service: General;  Laterality: N/A;   LEFT HEART CATH N/A 12/31/2020   Procedure: Left Heart Cath;  Surgeon: Knox Perl, MD;  Location: [facility] INVASIVE CV LAB;  Service: Cardiovascular;  Laterality: N/A;   LEFT HEART CATH AND CORONARY ANGIOGRAPHY N/A 10/11/2020   Procedure: LEFT HEART CATH AND CORONARY ANGIOGRAPHY;  Surgeon: Knox Perl, MD;  Location: [facility] INVASIVE CV LAB;  Service: Cardiovascular;  Laterality: N/A;   LOWER EXTREMITY ANGIOGRAM Bilateral 04/18/2012   Procedure: LOWER EXTREMITY ANGIOGRAM;  Surgeon: Dannis Dy, MD;  Location: [facility] CATH LAB;  Service: Cardiovascular;  Laterality: Bilateral;  bilat lower extrem angio   LOWER EXTREMITY ANGIOGRAPHY Bilateral 05/02/2019   Procedure: LOWER EXTREMITY ANGIOGRAPHY;  Surgeon: Knox Perl, MD;  Location: [facility] INVASIVE CV LAB;  Service: Cardiovascular;  Laterality: Bilateral;    LOWER EXTREMITY ANGIOGRAPHY Bilateral 02/28/2019   Procedure: LOWER EXTREMITY ANGIOGRAPHY;  Surgeon: Knox Perl, MD;  Location: [facility] INVASIVE CV LAB;  Service: Cardiovascular;  Laterality: Bilateral;   macular photocoagulation     (eye treatments for diabetic retinopathy)-Dr. Augustus Ledger   PERIPHERAL VASCULAR INTERVENTION  02/28/2019   Procedure: PERIPHERAL VASCULAR INTERVENTION;  Surgeon: Knox Perl, MD;  Location: [facility] INVASIVE CV LAB;  Service: Cardiovascular;;   TEE WITHOUT CARDIOVERSION N/A 03/18/2022   Procedure: TRANSESOPHAGEAL ECHOCARDIOGRAM (TEE);  Surgeon: Knox Perl, MD;  Location: [facility] ENDOSCOPY;  Service: Cardiovascular;  Laterality: N/A;   VENTRICULAR ASSIST DEVICE INSERTION N/A 12/31/2020   Procedure: VENTRICULAR ASSIST DEVICE INSERTION;  Surgeon: Knox Perl, MD;  Location: [facility] INVASIVE CV LAB;  Service: Cardiovascular;  Laterality: N/A;    Medications: Reviewed  Allergies:  Allergies  Allergen Reactions   Empagliflozin     Other Reaction(s): foot infection   Shellfish-Derived Products Nausea And Vomiting and Other (See Comments)    Only Mussels cause severe nausea and vomiting    Latex Rash   Tape Rash and Other (See Comments)    Caused issues  with the skin   Testosterone Rash    did not feel well     Family history: No family history of cancer  Social History:   He lives with his significant other in [place].  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  He quit smoking cigarettes in 2013.  He reports being a "alcoholic "in the [employer] and discontinued alcohol in 2003.  No risk factor for HIV or hepatitis.  ROS:   Positives include: Decreased appetite and 40 pound weight loss after starting Mounjaro, suprapubic/scrotal edema following small bowel surgery January 2025  A complete ROS was otherwise negative.  Physical Exam:  Blood pressure (!) 89/61, pulse 97, temperature 98.2 F (36.8 C), temperature source Temporal, resp. rate 18,  height 5\' 9"  (1.753 m), weight 223 lb 11.2 oz (101.5 kg), SpO2 100%.  HEENT: Oropharynx without visible mass, neck without mass Lungs: Decreased breath sounds at the left lower posterior chest, no respiratory distress Cardiac: Regular rate and rhythm Abdomen: No hepatosplenomegaly, nontender, no mass GU: Testes without mass Vascular: Trace edema throughout the left lower leg Lymph nodes: No cervical, supraclavicular, axillary, or inguinal nodes Neurologic: Alert and oriented, the motor exam appears intact in the upper and lower extremities bilaterally Skin: No rash, diffuse cutaneous edema in the suprapubic area and scrotum Musculoskeletal: No spine tenderness   LAB:  CBC  Lab Results  Component Value Date   WBC 6.7 11/18/2023   HGB 10.6 (L) 11/18/2023   HCT 33.1 (L) 11/18/2023   MCV 91.4 11/18/2023   PLT 188 11/18/2023   NEUTROABS 6.3 09/25/2022        CMP  Lab Results  Component Value Date   NA 140 11/18/2023   K 4.1 11/18/2023   CL 104 11/18/2023   CO2 27 11/18/2023   GLUCOSE 179 (H) 11/18/2023   BUN 28 (H) 11/18/2023   CREATININE 1.40 (H) 11/18/2023   CALCIUM 8.4 (L) 11/18/2023   PROT 7.3 11/13/2023   ALBUMIN 4.0 11/13/2023   AST 34 11/13/2023   ALT 30 11/13/2023   ALKPHOS 77 11/13/2023   BILITOT 1.5 (H) 11/13/2023   GFRNONAA 55 (L) 11/18/2023   GFRAA 44 (L) 12/19/2020    Imaging:  As per HPI, CT and MRI images were reviewed with Mr. Grassel    Assessment/Plan:   Liver mass CT abdomen/pelvis 11/14/2023-closed-loop small bowel obstruction, 8.8 cm hypoattenuating lesion in the posterior right liver with suggestion of nodular peripheral enhancement, small bilateral pleural effusions MRI abdomen 01/27/2024-rim-enhancing centrally hypoattenuating mass in the posterior right liver segment 7 measuring 11 x 8.4 cm, increased in size compared to the 11/14/2023 CT consistent with a necrotic metastasis or primary liver mass, severe anasarca, small volume ascites,  bilateral pleural effusions, mild splenomegaly CT abdomen/pelvis 02/14/2024-no change in complex right liver lesion, small bilateral pleural effusions left greater than right, soft tissue edema of the anterior abdominal wall 11/14/2023-small bowel closed-loop obstruction, status post exploratory laparotomy and small bowel resection, pathology with no malignancy Diabetes Hypertension 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 Serpa is referred for evaluation of a liver mass.  He has a peripherally enhancing central hypoattenuating mass in the right liver.  I reviewed the CT and MRI images with Mr. Parrillo.  The mass appears to have enlarged between January and the present.  We discussed the differential diagnosis including hepatocellular carcinoma, cholangiocarcinoma,  and metastatic disease from a distant primary tumor site.  The lesion could be benign, but this is less likely given the growth over the past few months.  The AFP is normal and other markers for chronic liver disease (hepatitis B serologies, hepatitis C RNA, and ferritin level) have returned normal.  The CA 19-9 is normal.  He has a remote smoking history and there are bilateral pleural effusions.  Lung cancer is in the differential diagnosis.  Mr. Delair will be referred for a diagnostic biopsy of the liver mass.  He will return for an office visit and further discussion after the biopsy.  Coni Deep, MD  02/22/2024, 4:40 PM

## 2024-02-23 ENCOUNTER — Encounter: Payer: Self-pay | Admitting: *Deleted

## 2024-02-23 ENCOUNTER — Telehealth: Payer: Self-pay

## 2024-02-23 NOTE — Telephone Encounter (Signed)
   Pre-operative Risk Assessment    Patient Name: Eduardo [name]  DOB: 1956-03-25 MRN: [id number]   Date of last office visit: 08/24/2023 Eduardo Hug, MD Date of next office visit: 03/11/2024 Eduardo Hug, MD   Request for Surgical Clearance    Procedure:   Not indicated  Date of Surgery:  Clearance TBD                                 Surgeon:  NA Surgeon's Group or Practice Name:  [facility] Radiology Department  Phone number:  NA Fax number:  [phone]   Type of Clearance Requested:   - Medical  - Pharmacy:  Hold Clopidogrel (Plavix) Hold 5 days prior to procedure   Type of Anesthesia:  Not Indicated   Additional requests/questions:  This clearance did not show much information. I tried to contact Melissa X on a secure chat because there is no office name or number to call on clearance, but she is currently showing she was "offline".  Faxing back to number provided to get more information.   Darell Echevaria   02/23/2024, 1:17 PM

## 2024-02-23 NOTE — Progress Notes (Unsigned)
 Called radiology scheduling to set up liver biopsy. Informed Melissa that last dose of Plavix was 02/22/24 and OK to hold x 5 days per Dr. Scherrie Curt. She will send a staff message to MD to confirm this and send back to her. She will move the order to physician review and will call patient when radiologist approves the procedure.

## 2024-02-24 ENCOUNTER — Encounter: Payer: Self-pay | Admitting: *Deleted

## 2024-02-24 NOTE — Telephone Encounter (Signed)
 I will forward this to Melissa Xayasine as we are unable to reach her through secure chat and she has not provided us  with a call back # if we have questions.    Melissa, preop APP is needing to confirm if there is any anesthesia being used for procedure and if so what type of anesthesia being used. Also please provide a phone # if we have additional questions. Also please see notes below in regard to the request sent to MD's box. Our practice has a preop protocol and format for consistency. FYI in the future please fax the request to fax# [phone] attn: preop team, to ensure no pt is missed. Thank you for your time and help in this matter. Please reply with anesthesia and ph# . You can call the preop line and left a vm if easier: [phone]. We cannot proceed until we have complete information.     I am adding the request that was sent to our office as well for documentation. We will need to inform the requesting office to please not address the clearance directly to the MD's box as their in box may not be checked daily. We would not to miss any pt who needs preop clearance and inadvertently cause a delay in the procedure due to preop team not receiving the request.   See below is the request that was sent to the MD's in box.   See that this information has been input into the preop format for the preop APP to review.   Evan Hillock, RN  P Cv Div Preop Callback      Previous Messages    ----- Message ----- From: Cody Das, MD Sent: 02/23/2024   7:09 PM EDT To: Melissa Xayasine; Evan Hillock, RN Subject: RE: Blood thinner hold request                Sure.  Low cardiac risk. Okay to hold plavix for 5-7 days before procedure. Ruthann Cover, can you please fax the letter?  Thanks MJP ----- Message ----- From: Xayasine, Melissa Sent: 02/23/2024   8:50 AM EDT To: Cody Das, MD; Melissa Xayasine Subject: Blood thinner hold request                    The  [facility] Radiology Department has worked in conjunction with [facility] Radiology  to develop blood thinner protocols. The following patient needs to hold his/her blood thinners  _Plavix  ____5__ days prior to their procedure schedule - To be determined.  Need clearance to hold prior to scheduling.   Fax was also sent to office.    Thank you, The Centralized Scheduling Team  Please Fax a clearance approval to [phone] ATTN Melissa X or response to this message will also work .

## 2024-02-24 NOTE — Progress Notes (Signed)
 Eduardo Cord, MD  Keilly Fatula Approved for US  guided bx of large liver mass.  HKM       Previous Messages    ----- Message ----- From: Aurie Harroun Sent: 02/23/2024   9:48 AM EDT To: Doreather Hoxworth; Ir Procedure Requests Subject: US  liver biopsy                                Procedure - us  liver biopsy  Reason - Right liver mass Dx: Liver mass, right lobe [R16.0 (ICD-10-CM)]  Ordering Comments  Patient on Plavix, last dose 02/22/2024     History : Ct abd pelv /w , Mr abdomen w/wo  Provider : Sumner Ends, MD  Contact :  [phone]

## 2024-02-24 NOTE — Telephone Encounter (Signed)
   Patient Name: Eduardo [name]  DOB: [birth year] MRN: [id number]  Primary Cardiologist: Cody Das, MD  Chart reviewed as part of pre-operative protocol coverage. Given past medical history and time since last visit, based on ACC/AHA guidelines, FRANCISCA HARBUCK is at acceptable risk for the planned procedure without further cardiovascular testing.   Per protocol patient can hold Plavix 5 to 7 days prior to procedure and should restart postprocedure when surgically safe and hemostasis is achieved.  The patient was advised that if he develops new symptoms prior to surgery to contact our office to arrange for a follow-up visit, and he verbalized understanding.  I will route this recommendation to the requesting party via Epic fax function and remove from pre-op pool.  Please call with questions.  Francene Ing, Retha Cast, NP 02/24/2024, 10:28 AM

## 2024-02-24 NOTE — Progress Notes (Signed)
 Sumner Ends, MD  Mairin Lindsley; Elsa Halls, RN Okay to hold the Plavix       Previous Messages    ----- Message ----- From: Jeremaih Klima Sent: 02/23/2024   9:45 AM EDT To: Sumner Ends, MD Subject: FW: Blood thinner hold request                 ----- Message ----- From: Dametrius Sanjuan Sent: 02/23/2024   8:50 AM EDT To: Cody Das, MD; Bruin Bolger Subject: Blood thinner hold request                    The [facility] Radiology Department has worked in conjunction with [facility]iology  to develop blood thinner protocols. The following patient needs to hold his/her blood thinners  _Plavix  ____5__ days prior to their procedure schedule - To be determined.  Need clearance to hold prior to scheduling.   Fax was also sent to office.    Thank you, The Centralized Scheduling Team  Please Fax a clearance approval to [phone] ATTN Jasmyne Lodato X or response to this message will also work .          _______________________________________________________________________________________    Cody Das, MD  Ananias Kolander; Stringer, Brooke B, RN Sure.  Low cardiac risk. Okay to hold plavix for 5-7 days before procedure. Ruthann Cover, can you please fax the letter?  Thanks MJP       Previous Messages    ----- Message ----- From: Aarush Stukey Sent: 02/23/2024   8:50 AM EDT To: Cody Das, MD; Denika Krone Subject: Blood thinner hold request                    The [facility] Radiology Department has worked in conjunction with [facility] Radiology  to develop blood thinner protocols. The following patient needs to hold his/her blood thinners  _Plavix  ____5__ days prior to their procedure schedule - To be determined.  Need clearance to hold prior to scheduling.   Fax was also sent to office.    Thank you, The Centralized Scheduling Team  Please Fax a clearance approval to [phone]  ATTN Taneesha Edgin X or response to this message will also work .

## 2024-02-24 NOTE — Telephone Encounter (Signed)
 Callback team please contact requesting provider for more information on procedure and anesthesia type.  Thanks

## 2024-02-24 NOTE — Telephone Encounter (Signed)
 ADDENUM TO CLEARANCE REQUEST:   PH# [phone]  Eduardo [name]  PROCEDURE: LIVER Bx  ANESTHESIA: MODERATE SEDATION  I will send this back to the preop APP to review as it also looks like Dr. Filiberto Hug has provided clearance, though I will confirm this with preop APP as well.

## 2024-03-01 NOTE — Progress Notes (Unsigned)
  Electrophysiology Office Note:   ID:  Harlen, Danford 09/16/1956, MRN [id number]  Primary Cardiologist: Cody Das, MD Electrophysiologist: Manya Sells, MD  {Click to update primary MD,subspecialty MD or APP then REFRESH:1}    History of Present Illness:   Eduardo [name] is a 68 y.o. male with h/o CHF and LBBB s/p BIV ICD seen today for routine electrophysiology followup.   Since last being seen in our clinic the patient reports doing ***.  he denies chest pain, palpitations, dyspnea, PND, orthopnea, nausea, vomiting, dizziness, syncope, edema, weight gain, or early satiety.   Review of systems complete and found to be negative unless listed in HPI.   EP Information / Studies Reviewed:    EKG is ordered today. Personal review as below.       ICD Interrogation-  reviewed in detail today,  See PACEART report.  Arrhythmia/Device History Abbott BIV-ICD 10/2022 for CHF and LBBB   Physical Exam:   VS:  There were no vitals taken for this visit.   Wt Readings from Last 3 Encounters:  02/22/24 223 lb 11.2 oz (101.5 kg)  01/03/24 232 lb (105.2 kg)  11/13/23 228 lb 9.6 oz (103.7 kg)     GEN: No acute distress *** NECK: No JVD; No carotid bruits CARDIAC: {EPRHYTHM:28826}, no murmurs, rubs, gallops RESPIRATORY:  Clear to auscultation without rales, wheezing or rhonchi  ABDOMEN: Soft, non-tender, non-distended EXTREMITIES:  {EDEMA LEVEL:28147::"No"} edema; No deformity   ASSESSMENT AND PLAN:    Chronic systolic CHF  s/p Abbott CRT-D  EF 30-35% 09/2023 euvolemic today Stable on an appropriate medical regimen Normal ICD function See Pace Art report No changes today  CAD Denies s/s ischemia  Disposition:   Follow up with {EPPROVIDERS:28135} {EPFOLLOW UP:28173}   Signed, Tylene Galla, PA-C

## 2024-03-02 ENCOUNTER — Encounter: Payer: Self-pay | Admitting: Student

## 2024-03-02 ENCOUNTER — Ambulatory Visit: Payer: Medicare Other | Attending: Student | Admitting: Student

## 2024-03-02 VITALS — BP 130/68 | HR 92 | Ht 69.0 in | Wt 223.0 lb

## 2024-03-02 DIAGNOSIS — I447 Left bundle-branch block, unspecified: Secondary | ICD-10-CM

## 2024-03-02 DIAGNOSIS — I5022 Chronic systolic (congestive) heart failure: Secondary | ICD-10-CM

## 2024-03-02 DIAGNOSIS — I2510 Atherosclerotic heart disease of native coronary artery without angina pectoris: Secondary | ICD-10-CM

## 2024-03-02 LAB — CUP PACEART INCLINIC DEVICE CHECK
Battery Remaining Longevity: 70 mo
Brady Statistic RA Percent Paced: 0.39 %
Brady Statistic RV Percent Paced: 95 %
Date Time Interrogation Session: 20250424104915
HighPow Impedance: 40.5 Ohm
Implantable Lead Connection Status: 753985
Implantable Lead Connection Status: 753985
Implantable Lead Connection Status: 753985
Implantable Lead Implant Date: 20231213
Implantable Lead Implant Date: 20231213
Implantable Lead Implant Date: 20231213
Implantable Lead Location: 753858
Implantable Lead Location: 753859
Implantable Lead Location: 753860
Implantable Pulse Generator Implant Date: 20231213
Lead Channel Impedance Value: 325 Ohm
Lead Channel Impedance Value: 350 Ohm
Lead Channel Impedance Value: 687.5 Ohm
Lead Channel Pacing Threshold Amplitude: 0.625 V
Lead Channel Pacing Threshold Amplitude: 0.625 V
Lead Channel Pacing Threshold Amplitude: 1.125 V
Lead Channel Pacing Threshold Pulse Width: 0.5 ms
Lead Channel Pacing Threshold Pulse Width: 0.5 ms
Lead Channel Pacing Threshold Pulse Width: 0.5 ms
Lead Channel Sensing Intrinsic Amplitude: 3.1 mV
Lead Channel Sensing Intrinsic Amplitude: 9.9 mV
Lead Channel Setting Pacing Amplitude: 1.625
Lead Channel Setting Pacing Amplitude: 1.625
Lead Channel Setting Pacing Amplitude: 1.625
Lead Channel Setting Pacing Pulse Width: 0.5 ms
Lead Channel Setting Pacing Pulse Width: 0.5 ms
Lead Channel Setting Sensing Sensitivity: 0.5 mV
Pulse Gen Serial Number: 211000872

## 2024-03-02 NOTE — Patient Instructions (Addendum)
 Medication Instructions:  Your physician recommends that you continue on your current medications as directed. Please refer to the Current Medication list given to you today.  *If you need a refill on your cardiac medications before your next appointment, please call your pharmacy*  Lab Work: None ordered If you have labs (blood work) drawn today and your tests are completely normal, you will receive your results only by: MyChart Message (if you have MyChart) OR A paper copy in the mail If you have any lab test that is abnormal or we need to change your treatment, we will call you to review the results.  Follow-Up: At [facility], you and your health needs are our priority.  As part of our continuing mission to provide you with exceptional heart care, our providers are all part of one team.  This team includes your primary Cardiologist (physician) and Advanced Practice Providers or APPs (Physician Assistants and Nurse Practitioners) who all work together to provide you with the care you need, when you need it.  Your next appointment:   1 year  Provider:   Bambi Lever "Jonelle Neri" Percell Boyers, PA-C      1st Floor: - Lobby - Registration  - Pharmacy  - Lab - Cafe  2nd Floor: - PV Lab - Diagnostic Testing (echo, CT, nuclear med)  3rd Floor: - Vacant  4th Floor: - TCTS (cardiothoracic surgery) - AFib Clinic - Structural Heart Clinic - Vascular Surgery  - Vascular Ultrasound  5th Floor: - HeartCare Cardiology (general and EP) - Clinical Pharmacy for coumadin, hypertension, lipid, weight-loss medications, and med management appointments    Valet parking services will be available as well.

## 2024-03-03 ENCOUNTER — Ambulatory Visit: Admitting: Nurse Practitioner

## 2024-03-03 ENCOUNTER — Other Ambulatory Visit (HOSPITAL_COMMUNITY): Payer: Self-pay | Admitting: Student

## 2024-03-03 ENCOUNTER — Other Ambulatory Visit: Payer: Self-pay | Admitting: Radiology

## 2024-03-03 DIAGNOSIS — R160 Hepatomegaly, not elsewhere classified: Secondary | ICD-10-CM

## 2024-03-03 NOTE — Progress Notes (Signed)
 Remote ICD transmission.

## 2024-03-05 NOTE — H&P (Signed)
 Chief Complaint: Patient was seen in consultation today for liver lesion, with consideration for biopsy.  Referring Provider(s): Dr. Coni Deep, MD   Supervising Physician: Art Largo  Patient Status: [name] - Out-pt  Patient is Full Code  History of Present Illness: Eduardo [name] is a 68 y.o. male  with PMHx notable for HTN, HLD, CHF, CAD, PAD, CKD stage III, T2DM with diabetic retinopathy, polyuria, Dupuytren contracture of right hand, and osteomyelitis of right foot.  Per Dr. Enedina Harrow progress note date 4/15: "Eduardo [name] is referred for evaluation of a liver mass.  He has a peripherally enhancing central hypoattenuating mass in the right liver.  I reviewed the CT and MRI images with Eduardo [name].  The mass appears to have enlarged between January and the present.  We discussed the differential diagnosis including hepatocellular carcinoma, cholangiocarcinoma, and metastatic disease from a distant primary tumor site.  The lesion could be benign, but this is less likely given the growth over the past few months.   The AFP is normal and other markers for chronic liver disease (hepatitis B serologies, hepatitis C RNA, and ferritin level) have returned normal.  The CA 19-9 is normal.   He has a remote smoking history and there are bilateral pleural effusions.  Lung cancer is in the differential diagnosis.   Eduardo [name] will be referred for a diagnostic biopsy of the liver mass.  He will return for an office visit and further discussion after the biopsy."  Interventional Radiology was requested for liver lesion biopsy. Request was reviewed and approved by Dr. Marne Sings. Patient is scheduled for same in IR today.   Patient is currently without any significant complaints. Patient is alert and laying in bed, calm. Patient denies any fevers, headache, chest pain, SOB, cough, abdominal pain, nausea, vomiting or bleeding.     Past Medical History:  Diagnosis Date   CHF (congestive heart  failure) (HCC)    Chronic kidney disease    stage 3   Colon polyp    Coronary artery disease    Diabetes mellitus 1987   under care of Dr. [name].  On insulin  since 96 (off and on)   Diabetic retinopathy    Dupuytren contracture    R hand, s/p injection (Dr. Jonna Netter)   Essential hypertension, benign    Essential hypertension, benign 02/06/2019   Frequency of urination and polyuria    Hypertension    Myocardial infarction [facility])    denies   Neuromuscular disorder (HCC)    Diabetic neuropathy   Osteomyelitis (HCC)    right foot   Other testicular hypofunction    Peripheral arterial disease (HCC) 10/28/2012   Peritoneal abscess (HCC) 6/08   and buttock.   Pneumonia    Polydipsia    Proteinuria    Pure hyperglyceridemia    Subacute osteomyelitis, right ankle and foot (HCC)    Wears glasses     Past Surgical History:  Procedure Laterality Date   ABDOMINAL AORTAGRAM N/A 04/18/2012   Procedure: ABDOMINAL Tommi Fraise;  Surgeon: Dannis Dy, MD;  Location: [facility] CATH LAB;  Service: Cardiovascular;  Laterality: N/A;   AMPUTATION Right 05/19/2019   Procedure: RIGHT FOOT 5TH RAY AMPUTATION;  Surgeon: Timothy Ford, MD;  Location: [facility] OR;  Service: Orthopedics;  Laterality: Right;   AMPUTATION Right 03/11/2022   Procedure: RIGHT LEG DEBRIDEMENT VS. BELOW KNEE AMPUTATION;  Surgeon: Timothy Ford, MD;  Location: [facility] OR;  Service: Orthopedics;  Laterality: Right;   BIV  ICD INSERTION CRT-D N/A 10/21/2022   Procedure: BIV ICD INSERTION CRT-D;  Surgeon: Tammie Fall, MD;  Location: [facility] INVASIVE CV LAB;  Service: Cardiovascular;  Laterality: N/A;   BOWEL RESECTION N/A 11/14/2023   Procedure: SMALL BOWEL RESECTION;  Surgeon: Dorrie Gaudier, Alphonso Aschoff, MD;  Location: [facility] OR;  Service: General;  Laterality: N/A;   CARDIAC CATHETERIZATION N/A 09/08/2016   Procedure: Left Heart Cath and Coronary Angiography;  Surgeon: Knox Perl, MD;  Location: [facility] INVASIVE CV LAB;  Service: Cardiovascular;   Laterality: N/A;   CATARACT EXTRACTION, BILATERAL  09/2017, 10/2017   Dr. Lasandra Points   COLONOSCOPY W/ BIOPSIES AND POLYPECTOMY     CORONARY/GRAFT ACUTE MI REVASCULARIZATION N/A 10/11/2020   Procedure: Coronary/Graft Acute MI Revascularization;  Surgeon: Knox Perl, MD;  Location: [facility] INVASIVE CV LAB;  Service: Cardiovascular;  Laterality: N/A;   I & D EXTREMITY Right 03/11/2022   Procedure: BELOW KNEE AMPUTATION;  Surgeon: Timothy Ford, MD;  Location: [facility] OR;  Service: Orthopedics;  Laterality: Right;   INSERT / REPLACE / REMOVE PACEMAKER     LAPAROSCOPY N/A 11/14/2023   Procedure: LAPAROSCOPY DIAGNOSTIC;  Surgeon: Dorrie Gaudier, Alphonso Aschoff, MD;  Location: [facility] OR;  Service: General;  Laterality: N/A;  Converted to open ex-lap after inserting first trocar   LAPAROTOMY N/A 11/14/2023   Procedure: EXPLORATORY LAPAROTOMY;  Surgeon: Dorrie Gaudier Alphonso Aschoff, MD;  Location: [facility] OR;  Service: General;  Laterality: N/A;   LEFT HEART CATH N/A 12/31/2020   Procedure: Left Heart Cath;  Surgeon: Knox Perl, MD;  Location: [facility] INVASIVE CV LAB;  Service: Cardiovascular;  Laterality: N/A;   LEFT HEART CATH AND CORONARY ANGIOGRAPHY N/A 10/11/2020   Procedure: LEFT HEART CATH AND CORONARY ANGIOGRAPHY;  Surgeon: Knox Perl, MD;  Location: [facility] INVASIVE CV LAB;  Service: Cardiovascular;  Laterality: N/A;   LOWER EXTREMITY ANGIOGRAM Bilateral 04/18/2012   Procedure: LOWER EXTREMITY ANGIOGRAM;  Surgeon: Dannis Dy, MD;  Location: [facility] CATH LAB;  Service: Cardiovascular;  Laterality: Bilateral;  bilat lower extrem angio   LOWER EXTREMITY ANGIOGRAPHY Bilateral 05/02/2019   Procedure: LOWER EXTREMITY ANGIOGRAPHY;  Surgeon: Knox Perl, MD;  Location: [facility] INVASIVE CV LAB;  Service: Cardiovascular;  Laterality: Bilateral;   LOWER EXTREMITY ANGIOGRAPHY Bilateral 02/28/2019   Procedure: LOWER EXTREMITY ANGIOGRAPHY;  Surgeon: Knox Perl, MD;  Location: [facility] INVASIVE CV LAB;  Service: Cardiovascular;  Laterality: Bilateral;   macular  photocoagulation     (eye treatments for diabetic retinopathy)-Dr. Augustus Ledger   PERIPHERAL VASCULAR INTERVENTION  02/28/2019   Procedure: PERIPHERAL VASCULAR INTERVENTION;  Surgeon: Knox Perl, MD;  Location: [facility] INVASIVE CV LAB;  Service: Cardiovascular;;   TEE WITHOUT CARDIOVERSION N/A 03/18/2022   Procedure: TRANSESOPHAGEAL ECHOCARDIOGRAM (TEE);  Surgeon: Knox Perl, MD;  Location: [facility] ENDOSCOPY;  Service: Cardiovascular;  Laterality: N/A;   VENTRICULAR ASSIST DEVICE INSERTION N/A 12/31/2020   Procedure: VENTRICULAR ASSIST DEVICE INSERTION;  Surgeon: Knox Perl, MD;  Location: [facility] INVASIVE CV LAB;  Service: Cardiovascular;  Laterality: N/A;    Allergies: Empagliflozin , Shellfish-derived products, Latex, Tape, and Testosterone   Medications: Prior to Admission medications   Medication Sig Start Date End Date Taking? Authorizing Provider  acetaminophen  (TYLENOL ) 325 MG tablet Take 1-2 tablets (325-650 mg total) by mouth every 4 (four) hours as needed for mild pain. 03/31/22   Setzer, Sandra J, PA-C  Bioflavonoid Products (ESTER C PO) Take 1,000 mg by mouth daily.    [provider]  Cholecalciferol  (VITAMIN D ) 2000 units CAPS Take 2,000 Units by mouth daily.  [provider]  clopidogrel  (PLAVIX ) 75 MG tablet Take 1 tablet (75 mg total) by mouth daily. 12/02/23   Patwardhan, Kaye Parsons, MD  ezetimibe  (ZETIA ) 10 MG tablet TAKE 1 TABLET BY MOUTH DAILY 07/08/23   Patwardhan, Manish J, MD  gemfibrozil  (LOPID ) 600 MG tablet Take 600 mg by mouth 2 (two) times daily before a meal.    [provider]  HUMALOG KWIKPEN 100 UNIT/ML KiwkPen Inject 3-10 Units into the skin 3 (three) times daily. Inject 01-1009 units into the skin three times a day, per sliding scale- based on BGL >100 08/02/16   [provider]  insulin  NPH Human (NOVOLIN N) 100 UNIT/ML injection Inject 6 Units into the skin daily before breakfast.    [provider]  Oksana Bergamo Oil 300 MG CAPS Take 500 mg by  mouth daily.    [provider]  lidocaine  (XYLOCAINE ) 5 % ointment Apply 1 Application topically as needed. For pain as needed- can use up to 4x/day 01/03/24   Lovorn, Megan, MD  melatonin 10 MG TABS Take 10 mg by mouth at bedtime. 03/31/22   Setzer, Sandra J, PA-C  MOUNJARO 10 MG/0.5ML Pen Inject 10 mg into the skin once a week. 02/03/23   [provider]  Multiple Vitamin (MULTIVITAMIN WITH MINERALS) TABS tablet Take 1 tablet by mouth daily.    [provider]  nitroGLYCERIN  (NITROSTAT ) 0.4 MG SL tablet Place 1 tablet (0.4 mg total) under the tongue every 5 (five) minutes as needed for chest pain. 05/08/21   Cantwell, Celeste C, PA-C  Probiotic Product (PROBIOTIC-10 PO) Take 1 capsule by mouth daily.    [provider]  rosuvastatin  (CRESTOR ) 20 MG tablet Take 1 tablet (20 mg total) by mouth daily. 07/22/20   Knox Perl, MD  sacubitril -valsartan  (ENTRESTO ) 24-26 MG TAKE 1 TABLET BY MOUTH TWICE  DAILY 10/11/23   Patwardhan, Manish J, MD  senna-docusate (SENOKOT-S) 8.6-50 MG tablet Take 1 tablet by mouth at bedtime as needed for mild constipation. Patient not taking: Reported on 02/22/2024 11/18/23   Deforest Fast, MD  sodium chloride  (OCEAN) 0.65 % SOLN nasal spray Place 1 spray into both nostrils as needed for congestion.    [provider]  torsemide  (DEMADEX ) 20 MG tablet Take 1 tablet (20 mg total) by mouth daily. Take additional one tablet by mouth as needed for swelling 08/24/23   Patwardhan, Kaye Parsons, MD     Family History  Problem Relation Age of Onset   Diabetes Mother    Hearing loss Mother    Hypertension Mother    Hyperlipidemia Mother    Heart disease Mother    Varicose Veins Mother    Varicose Veins Father    Dementia Father    Hyperlipidemia Brother    Diabetes Maternal Grandmother     Social History   Socioeconomic History   Marital status: Widowed    Spouse name: Not on file   Number of children: 0   Years of education: Not  on file   Highest education level: Not on file  Occupational History   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 Retired. 1 cat.  Girlfriend and her daughter lives with him. Enjoys fishing.   Updated 06/2021   Social Drivers of Health   Financial Resource Strain: Low Risk  (07/28/2023)   Overall Financial Resource Strain (CARDIA)    Difficulty of Paying Living Expenses: Not hard at all  Food Insecurity: No Food Insecurity (02/22/2024)   Hunger Vital Sign    Worried About Running Out of Food in the Last Year: Never true    Ran Out of Food in the Last Year: Never true  Transportation Needs: No Transportation Needs (02/22/2024)   PRAPARE - Administrator, Civil Service (Medical): No    Lack of Transportation (Non-Medical): No  Physical Activity: Sufficiently Active (07/28/2023)   Exercise Vital Sign    Days of Exercise per Week: 7 days    Minutes of Exercise per Session: 90 min  Stress: No Stress Concern Present (07/28/2023)   [employer] of Occupational Health - Occupational Stress Questionnaire    Feeling of Stress : Not at all  Social Connections: Socially Isolated (11/14/2023)   Social Connection and Isolation Panel [NHANES]    Frequency of Communication with Friends and Family: Never    Frequency of Social Gatherings with Friends and Family: Never    Attends  Religious Services: Never    Database administrator or Organizations: No    Attends Engineer, structural: Never    Marital Status: Married     Review of Systems: A 12 point ROS discussed and pertinent positives are indicated in the HPI above.  All other systems are negative.  Vital Signs: BP (!) 92/55   Pulse 98   Temp 97.8 F (36.6 C) (Oral)   Resp 20   Ht 5\' 9"  (1.753 m)   Wt 213 lb (96.6 kg)   SpO2 100%   BMI 31.45 kg/m   Advance Care Plan: The advanced care place/surrogate decision maker was discussed at the time of visit and the patient did not wish to discuss or was not able to name a surrogate decision maker or provide an advance care plan.  Physical Exam Vitals reviewed.  Constitutional:      General: He is not in acute distress.    Appearance: Normal appearance.  HENT:     Mouth/Throat:     Mouth: Mucous membranes are moist.  Cardiovascular:     Rate and Rhythm: Normal rate and regular rhythm.     Pulses: Normal pulses.     Heart sounds: No murmur heard. Pulmonary:     Effort: Pulmonary effort is normal. No respiratory distress.     Breath sounds: Normal breath sounds.  Abdominal:     General: Abdomen is flat. There is no distension.     Tenderness: There is no abdominal tenderness.  Musculoskeletal:        General: Normal range of motion.     Cervical back: Normal range of motion.  Skin:    General: Skin is warm and dry.  Neurological:     Mental Status: He is alert and oriented to person, place, and time.  Psychiatric:  Mood and Affect: Mood normal.        Behavior: Behavior normal.        Thought Content: Thought content normal.        Judgment: Judgment normal.     Imaging: CUP PACEART INCLINIC DEVICE CHECK Result Date: 03/02/2024 Normal in-clinic CRT-D (multi-lead) check. Presenting Rhythm: AS-VP(BIV) . Routine testing was performed. Thresholds, sensing, impedance trend were stable and no changes were required. HF diagnostics are  stable. No treated arrhythmias. Patient BiV pacing  95 % of the time. Estimated longevity 8yr19mo. Pt enrolled in remote follow-up.  CT ABDOMEN PELVIS W CONTRAST Result Date: 02/16/2024 CLINICAL DATA:  Pain and bloating postop EXAM: CT ABDOMEN AND PELVIS WITH CONTRAST TECHNIQUE: Multidetector CT imaging of the abdomen and pelvis was performed using the standard protocol following bolus administration of intravenous contrast. RADIATION DOSE REDUCTION: This exam was performed according to the departmental dose-optimization program which includes automated exposure control, adjustment of the mA and/or kV according to patient size and/or use of iterative reconstruction technique. CONTRAST:  ISOVUE -300 IOPAMIDOL  (ISOVUE -300) INJECTION 61% COMPARISON:  MRI abdomen February 04, 2024 CT abdomen November 14, 2023 FINDINGS: Lower chest: Small bilateral pleural effusions left larger than right with left lower lobe infiltrates and atelectasis. Hepatobiliary: Comparison with prior examinations demonstrates no significant change in the previously described complex right lobe of the liver lesion measuring 8 x 8 cm in maximum AP and transverse diameter. Differential diagnosis remains similar to prior. Findings correlate with the previously described findings of the MRI February 04, 2024. Differential diagnosis remains as described on the MRI for possible necrotic metastatic lesion or primary liver neoplastic lesion. Gallbladder unremarkable.  No other hepatic lesions. Pancreas: Unremarkable. No pancreatic ductal dilatation or surrounding inflammatory changes. Spleen: Normal in size without focal abnormality. Adrenals/Urinary Tract: Adrenal glands are unremarkable. Kidneys are normal, without renal calculi, focal lesion, or hydronephrosis. Bladder is unremarkable. Stomach/Bowel: Stomach is within normal limits. Appendix appears normal. No evidence of bowel wall thickening, distention, or inflammatory changes. Moderate amount of  residual fecal material throughout the left side of the colon and rectosigmoid colon without obstruction, with mild impaction into the rectosigmoid colon. Vascular/Lymphatic: Aortic atherosclerosis. No enlarged abdominal or pelvic lymph nodes. Reproductive: Prostate is unremarkable. Other: Diffuse soft tissue edema of the anterior abdominal wall subcutaneous tissues may correlate with nonspecific anasarca. No fluid collections. No ascites. Musculoskeletal: No acute or significant osseous findings. IMPRESSION: *No significant change in the previously described complex right lobe of the liver lesion measuring 8 x 8 cm in maximum AP and transverse diameter. Differential diagnosis remains as described on the MRI for possible necrotic metastatic lesion or primary liver neoplastic lesion. *Small bilateral pleural effusions left larger than right with left lower lobe infiltrates and atelectasis. *Moderate amount of residual fecal material throughout the left side of the colon and rectosigmoid colon without obstruction, with mild impaction into the rectosigmoid colon. *Diffuse soft tissue edema of the anterior abdominal wall subcutaneous tissues may correlate with nonspecific anasarca. No fluid collections. *Aortic atherosclerosis. Electronically Signed   By: Fredrich Jefferson M.D.   On: 02/16/2024 16:22    Labs:  CBC: Recent Labs    11/16/23 0341 11/17/23 0642 11/18/23 0626 03/06/24 0657  WBC 8.8 8.8 6.7 7.5  HGB 10.3* 10.4* 10.6* 12.7*  HCT 31.9* 32.2* 33.1* 40.8  PLT 127* 161 188 229    COAGS: Recent Labs    03/06/24 0657  INR 1.2    BMP: Recent Labs    11/16/23  4782 11/17/23 0642 11/18/23 0626 03/06/24 0657  NA 141 141 140 138  K 4.0 3.6 4.1 4.2  CL 106 104 104 103  CO2 24 24 27 24   GLUCOSE 164* 175* 179* 134*  BUN 40* 34* 28* 42*  CALCIUM  8.6* 8.5* 8.4* 9.2  CREATININE 1.51* 1.32* 1.40* 1.58*  GFRNONAA 50* 59* 55* 47*    LIVER FUNCTION TESTS: Recent Labs    10/19/23 0000  11/13/23 2230 03/06/24 0657  BILITOT  --  1.5* 0.9  AST 22 34 32  ALT 22 30 17   ALKPHOS 85 77 79  PROT  --  7.3 6.6  ALBUMIN   --  4.0 3.4*    TUMOR MARKERS: No results for input(s): "AFPTM", "CEA", "CA199", "CHROMGRNA" in the last 8760 hours.  Assessment and Plan: Per Dr. Enedina Harrow progress note date 4/15: "Eduardo [name] is referred for evaluation of a liver mass.  He has a peripherally enhancing central hypoattenuating mass in the right liver.  I reviewed the CT and MRI images with Mr. Jagielski.  The mass appears to have enlarged between January and the present.  We discussed the differential diagnosis including hepatocellular carcinoma, cholangiocarcinoma, and metastatic disease from a distant primary tumor site.  The lesion could be benign, but this is less likely given the growth over the past few months.   The AFP is normal and other markers for chronic liver disease (hepatitis B serologies, hepatitis C RNA, and ferritin level) have returned normal.  The CA 19-9 is normal.   He has a remote smoking history and there are bilateral pleural effusions.  Lung cancer is in the differential diagnosis.   Mr. Kokal will be referred for a diagnostic biopsy of the liver mass.  He will return for an office visit and further discussion after the biopsy."   All labs and medications are within acceptable parameters. Patient has a latex and a tape allergy. Patient has been NPO since midnight. As requested, last dose of Plavix  was 13 days ago.  Patient presents for scheduled liver lesion biopsy in IR today.  Risks and benefits of liver lesion biopsy was discussed with the patient and/or patient's family including, but not limited to bleeding, infection, damage to adjacent structures or low yield requiring additional tests.  All of the questions were answered and there is agreement to proceed.  Consent signed and in chart.      Thank you for allowing our service to participate in KOLBIE ALLENBAUGH 's  care.  Electronically Signed: Lovena Rubinstein, PA-C   03/06/2024, 7:47 AM      I spent a total of 30 Minutes in face to face in clinical consultation, greater than 50% of which was counseling/coordinating care for liver lesion, with consideration for biopsy.

## 2024-03-06 ENCOUNTER — Ambulatory Visit (HOSPITAL_COMMUNITY)
Admission: RE | Admit: 2024-03-06 | Discharge: 2024-03-06 | Disposition: A | Discharge status: Home / Self Care | Source: Ambulatory Visit | Attending: Oncology | Admitting: Oncology

## 2024-03-06 ENCOUNTER — Other Ambulatory Visit: Payer: Self-pay

## 2024-03-06 DIAGNOSIS — E1122 Type 2 diabetes mellitus with diabetic chronic kidney disease: Secondary | ICD-10-CM | POA: Diagnosis not present

## 2024-03-06 DIAGNOSIS — Z91048 Other nonmedicinal substance allergy status: Secondary | ICD-10-CM | POA: Diagnosis not present

## 2024-03-06 DIAGNOSIS — K7689 Other specified diseases of liver: Secondary | ICD-10-CM | POA: Diagnosis not present

## 2024-03-06 DIAGNOSIS — E781 Pure hyperglyceridemia: Secondary | ICD-10-CM | POA: Diagnosis not present

## 2024-03-06 DIAGNOSIS — I130 Hypertensive heart and chronic kidney disease with heart failure and stage 1 through stage 4 chronic kidney disease, or unspecified chronic kidney disease: Secondary | ICD-10-CM | POA: Insufficient documentation

## 2024-03-06 DIAGNOSIS — I509 Heart failure, unspecified: Secondary | ICD-10-CM | POA: Insufficient documentation

## 2024-03-06 DIAGNOSIS — N183 Chronic kidney disease, stage 3 unspecified: Secondary | ICD-10-CM | POA: Insufficient documentation

## 2024-03-06 DIAGNOSIS — I2510 Atherosclerotic heart disease of native coronary artery without angina pectoris: Secondary | ICD-10-CM | POA: Insufficient documentation

## 2024-03-06 DIAGNOSIS — Z87891 Personal history of nicotine dependence: Secondary | ICD-10-CM | POA: Diagnosis not present

## 2024-03-06 DIAGNOSIS — R160 Hepatomegaly, not elsewhere classified: Secondary | ICD-10-CM | POA: Diagnosis not present

## 2024-03-06 DIAGNOSIS — E785 Hyperlipidemia, unspecified: Secondary | ICD-10-CM | POA: Insufficient documentation

## 2024-03-06 DIAGNOSIS — Z01818 Encounter for other preprocedural examination: Secondary | ICD-10-CM | POA: Insufficient documentation

## 2024-03-06 DIAGNOSIS — Z7985 Long-term (current) use of injectable non-insulin antidiabetic drugs: Secondary | ICD-10-CM | POA: Diagnosis not present

## 2024-03-06 DIAGNOSIS — K769 Liver disease, unspecified: Secondary | ICD-10-CM | POA: Insufficient documentation

## 2024-03-06 DIAGNOSIS — E1169 Type 2 diabetes mellitus with other specified complication: Secondary | ICD-10-CM | POA: Insufficient documentation

## 2024-03-06 LAB — COMPREHENSIVE METABOLIC PANEL WITH GFR
ALT: 17 U/L (ref 0–44)
AST: 32 U/L (ref 15–41)
Albumin: 3.4 g/dL — ABNORMAL LOW (ref 3.5–5.0)
Alkaline Phosphatase: 79 U/L (ref 38–126)
Anion gap: 11 (ref 5–15)
BUN: 42 mg/dL — ABNORMAL HIGH (ref 8–23)
CO2: 24 mmol/L (ref 22–32)
Calcium: 9.2 mg/dL (ref 8.9–10.3)
Chloride: 103 mmol/L (ref 98–111)
Creatinine, Ser: 1.58 mg/dL — ABNORMAL HIGH (ref 0.61–1.24)
GFR, Estimated: 47 mL/min — ABNORMAL LOW (ref >60)
Glucose, Bld: 134 mg/dL — ABNORMAL HIGH (ref 70–99)
Potassium: 4.2 mmol/L (ref 3.5–5.1)
Sodium: 138 mmol/L (ref 135–145)
Total Bilirubin: 0.9 mg/dL (ref 0.0–1.2)
Total Protein: 6.6 g/dL (ref 6.5–8.1)

## 2024-03-06 LAB — CBC WITH DIFFERENTIAL/PLATELET
Abs Immature Granulocytes: 0.03 10*3/uL (ref 0.00–0.07)
Basophils Absolute: 0.1 10*3/uL (ref 0.0–0.1)
Basophils Relative: 2 %
Eosinophils Absolute: 0.2 10*3/uL (ref 0.0–0.5)
Eosinophils Relative: 3 %
HCT: 40.8 % (ref 39.0–52.0)
Hemoglobin: 12.7 g/dL — ABNORMAL LOW (ref 13.0–17.0)
Immature Granulocytes: 0 %
Lymphocytes Relative: 14 %
Lymphs Abs: 1 10*3/uL (ref 0.7–4.0)
MCH: 26.5 pg (ref 26.0–34.0)
MCHC: 31.1 g/dL (ref 30.0–36.0)
MCV: 85.2 fL (ref 80.0–100.0)
Monocytes Absolute: 0.5 10*3/uL (ref 0.1–1.0)
Monocytes Relative: 7 %
Neutro Abs: 5.6 10*3/uL (ref 1.7–7.7)
Neutrophils Relative %: 74 %
Platelets: 229 10*3/uL (ref 150–400)
RBC: 4.79 MIL/uL (ref 4.22–5.81)
RDW: 19.5 % — ABNORMAL HIGH (ref 11.5–15.5)
WBC: 7.5 10*3/uL (ref 4.0–10.5)
nRBC: 0 % (ref 0.0–0.2)

## 2024-03-06 LAB — PROTIME-INR
INR: 1.2 (ref 0.8–1.2)
Prothrombin Time: 15.3 s — ABNORMAL HIGH (ref 11.4–15.2)

## 2024-03-06 LAB — GLUCOSE, CAPILLARY
Glucose-Capillary: 139 mg/dL — ABNORMAL HIGH (ref 70–99)
Glucose-Capillary: 146 mg/dL — ABNORMAL HIGH (ref 70–99)

## 2024-03-06 MED ORDER — MIDAZOLAM HCL 2 MG/2ML IJ SOLN
INTRAMUSCULAR | Status: AC
  Filled 2024-03-06: qty 4

## 2024-03-06 MED ORDER — FENTANYL CITRATE (PF) 100 MCG/2ML IJ SOLN
INTRAMUSCULAR | Status: AC | PRN
  Administered 2024-03-06: 25 ug via INTRAVENOUS

## 2024-03-06 MED ORDER — DIPHENHYDRAMINE HCL 50 MG/ML IJ SOLN
INTRAMUSCULAR | Status: AC | PRN
  Administered 2024-03-06: 25 mg via INTRAVENOUS

## 2024-03-06 MED ORDER — LIDOCAINE HCL (PF) 1 % IJ SOLN
10.0000 mL | Freq: Once | INTRAMUSCULAR | Status: AC
  Administered 2024-03-06: 10 mL via INTRADERMAL

## 2024-03-06 MED ORDER — DIPHENHYDRAMINE HCL 50 MG/ML IJ SOLN
INTRAMUSCULAR | Status: AC
  Filled 2024-03-06: qty 1

## 2024-03-06 MED ORDER — HYDROCODONE-ACETAMINOPHEN 5-325 MG PO TABS: 1.0000 | ORAL_TABLET | ORAL | Status: DC | PRN

## 2024-03-06 MED ORDER — SODIUM CHLORIDE 0.9 % IV SOLN: INTRAVENOUS | Status: DC

## 2024-03-06 MED ORDER — FENTANYL CITRATE (PF) 100 MCG/2ML IJ SOLN
INTRAMUSCULAR | Status: AC
  Filled 2024-03-06: qty 2

## 2024-03-06 MED ORDER — MIDAZOLAM HCL 2 MG/2ML IJ SOLN
INTRAMUSCULAR | Status: AC | PRN
  Administered 2024-03-06: 1 mg via INTRAVENOUS
  Administered 2024-03-06: .5 mg via INTRAVENOUS

## 2024-03-06 NOTE — Progress Notes (Signed)
 Patient may resume plavix  tomorrow 03/07/24- per Lambert Pillion PA.

## 2024-03-06 NOTE — Procedures (Signed)
  Procedure:  US  core biopsy R liver mass 18g x3 Preprocedure diagnosis: Diagnoses of Liver mass, right lobe and Liver mass were pertinent to this visit. Postprocedure diagnosis: same EBL:    minimal Complications:   none immediate  See full dictation in [employer].  Nicky Barrack MD Main # [phone] Pager  [phone] Mobile [phone]

## 2024-03-07 LAB — SURGICAL PATHOLOGY

## 2024-03-09 ENCOUNTER — Ambulatory Visit (HOSPITAL_BASED_OUTPATIENT_CLINIC_OR_DEPARTMENT_OTHER)
Admission: RE | Admit: 2024-03-09 | Discharge: 2024-03-09 | Disposition: A | Discharge status: Home / Self Care | Source: Ambulatory Visit | Attending: Nurse Practitioner | Admitting: Nurse Practitioner

## 2024-03-09 ENCOUNTER — Inpatient Hospital Stay: Attending: Nurse Practitioner | Admitting: Nurse Practitioner

## 2024-03-09 ENCOUNTER — Encounter: Payer: Self-pay | Admitting: Nurse Practitioner

## 2024-03-09 VITALS — BP 96/68 | HR 97 | Temp 98.1°F | Resp 18 | Ht 69.0 in | Wt 230.8 lb

## 2024-03-09 DIAGNOSIS — N189 Chronic kidney disease, unspecified: Secondary | ICD-10-CM | POA: Insufficient documentation

## 2024-03-09 DIAGNOSIS — R160 Hepatomegaly, not elsewhere classified: Secondary | ICD-10-CM | POA: Diagnosis not present

## 2024-03-09 DIAGNOSIS — E1140 Type 2 diabetes mellitus with diabetic neuropathy, unspecified: Secondary | ICD-10-CM | POA: Diagnosis not present

## 2024-03-09 DIAGNOSIS — J90 Pleural effusion, not elsewhere classified: Secondary | ICD-10-CM | POA: Diagnosis not present

## 2024-03-09 DIAGNOSIS — I2510 Atherosclerotic heart disease of native coronary artery without angina pectoris: Secondary | ICD-10-CM | POA: Diagnosis not present

## 2024-03-09 DIAGNOSIS — I129 Hypertensive chronic kidney disease with stage 1 through stage 4 chronic kidney disease, or unspecified chronic kidney disease: Secondary | ICD-10-CM | POA: Diagnosis not present

## 2024-03-09 DIAGNOSIS — J9811 Atelectasis: Secondary | ICD-10-CM | POA: Diagnosis not present

## 2024-03-09 DIAGNOSIS — I255 Ischemic cardiomyopathy: Secondary | ICD-10-CM | POA: Diagnosis not present

## 2024-03-09 DIAGNOSIS — R918 Other nonspecific abnormal finding of lung field: Secondary | ICD-10-CM | POA: Diagnosis not present

## 2024-03-09 NOTE — Progress Notes (Signed)
   [facility] OFFICE PROGRESS NOTE   Diagnosis: Liver mass  INTERVAL HISTORY:   Eduardo [name] returns as scheduled.  He underwent biopsy of a right hepatic lesion 03/06/2024.  Pathology showed benign hepatic parenchyma with reactive changes.  No evidence of malignancy identified.  Energy level is poor.  He has noted some abdominal distention.  No nausea or vomiting.  Bowels moving regularly.  No shortness of breath.  Objective:  Vital signs in last 24 hours:  Blood pressure 96/68, pulse 97, temperature 98.1 F (36.7 C), temperature source Temporal, resp. rate 18, height 5\' 9"  (1.753 m), weight 230 lb 12.8 oz (104.7 kg), SpO2 99%.    Resp: Diminished breath sounds left lower lung field.  No respiratory distress. Cardio: Regular rate and rhythm. GI: No hepatosplenomegaly.  Abdominal wall edema. Vascular: Trace edema throughout the left lower leg.  Lab Results:  Lab Results  Component Value Date   WBC 7.5 03/06/2024   HGB 12.7 (L) 03/06/2024   HCT 40.8 03/06/2024   MCV 85.2 03/06/2024   PLT 229 03/06/2024   NEUTROABS 5.6 03/06/2024    Imaging:  No results found.  Medications: I have reviewed the patient's current medications.  Assessment/Plan: Liver mass CT abdomen/pelvis 11/14/2023-closed-loop small bowel obstruction, 8.8 cm hypoattenuating lesion in the posterior right liver with suggestion of nodular peripheral enhancement, small bilateral pleural effusions MRI abdomen 01/27/2024-rim-enhancing centrally hypoattenuating mass in the posterior right liver segment 7 measuring 11 x 8.4 cm, increased in size compared to the 11/14/2023 CT consistent with a necrotic metastasis or primary liver mass, severe anasarca, small volume ascites, bilateral pleural effusions, mild splenomegaly CT abdomen/pelvis 02/14/2024-no change in complex right liver lesion, small bilateral pleural effusions left greater than right, soft tissue edema of the anterior abdominal wall Biopsy liver  lesion 03/06/2024-benign hepatic parenchyma with reactive changes 11/14/2023-small bowel closed-loop obstruction, status post exploratory laparotomy and small bowel resection, pathology with no malignancy Diabetes Hypertension 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 Weinzapfel appears stable.  The recent liver biopsy was nondiagnostic.  We discussed proceeding with another liver biopsy versus PET scan to look for another site to biopsy.  He prefers to go ahead and repeat the liver biopsy.  Referral placed to IR.  We are also referring him for a staging noncontrast chest CT.  We will schedule a follow-up visit once the biopsy date is available.  He will contact the office prior to his next appointment with any problems.  Patient seen with Dr. Scherrie Curt.  Eduardo [name] ANP/GNP-BC   03/09/2024  1:23 PM  This was a shared visit with Eduardo [name].  Eduardo [name] was interviewed and examined.  We reviewed the liver biopsy pathology result with him.  He understands the biopsy may not represent tissue from the liver mass.  I recommend a CT chest to complete staging and a repeat biopsy.  We discussed the indication for a PET scan and he prefers to proceed with another biopsy.    I was present for greater than 50% of today's visit.  I performed Medical Decision Making.  Eduardo Kerns, MD

## 2024-03-10 ENCOUNTER — Telehealth (HOSPITAL_COMMUNITY): Payer: Self-pay | Admitting: Oncology

## 2024-03-10 ENCOUNTER — Telehealth: Payer: Self-pay

## 2024-03-10 NOTE — Progress Notes (Signed)
 Marland Silvas, MD  Joelle Musca PROCEDURE / BIOPSY REVIEW Date: 03/10/24  Requested Biopsy site: liver lesion Reason for request: prior nondiag Imaging review: Best seen on CT   Decision: Approved Imaging modality to perform: Ultrasound Schedule with: Moderate Sedation Schedule for: Any VIR  Additional comments:   Please contact me with questions, concerns, or if issue pertaining to this request arise.  Dayne Marland Silvas, MD [facility]       Previous Messages    ----- Message ----- From: Yoshito Gaza Sent: 03/09/2024   2:17 PM EDT To: Tychelle Purkey; Ir Procedure Requests Subject: US  liver bx                                    Procedure : US  liver biopsy  Reason: liver lesion biopsy, non diagnostic biopsy 4/28 Dx: Liver mass, right lobe [R16.0 (ICD-10-CM)]    History : US  Liver bx , CT and pelv w/ , MR abd w/wo  Provider : Roseline Conine, NP  Contact : [phone]

## 2024-03-10 NOTE — Telephone Encounter (Signed)
 I have forwarded the clearance to hold blood thinners to Melissa X via fax.

## 2024-03-10 NOTE — Telephone Encounter (Signed)
 Called Mr. Arroyos to see if he can do 5/8 at [facility] for a second liver biopsy. Left VM for him to call me back. JM

## 2024-03-14 ENCOUNTER — Other Ambulatory Visit: Payer: Self-pay | Admitting: Radiology

## 2024-03-14 DIAGNOSIS — R160 Hepatomegaly, not elsewhere classified: Secondary | ICD-10-CM

## 2024-03-15 ENCOUNTER — Other Ambulatory Visit: Payer: Self-pay | Admitting: Radiology

## 2024-03-16 ENCOUNTER — Ambulatory Visit (HOSPITAL_COMMUNITY)
Admission: RE | Admit: 2024-03-16 | Discharge: 2024-03-16 | Disposition: A | Discharge status: Home / Self Care | Source: Ambulatory Visit | Attending: Nurse Practitioner | Admitting: Nurse Practitioner

## 2024-03-16 ENCOUNTER — Other Ambulatory Visit: Payer: Self-pay

## 2024-03-16 ENCOUNTER — Other Ambulatory Visit (HOSPITAL_COMMUNITY): Payer: Self-pay | Admitting: Interventional Radiology

## 2024-03-16 DIAGNOSIS — I130 Hypertensive heart and chronic kidney disease with heart failure and stage 1 through stage 4 chronic kidney disease, or unspecified chronic kidney disease: Secondary | ICD-10-CM | POA: Diagnosis not present

## 2024-03-16 DIAGNOSIS — R160 Hepatomegaly, not elsewhere classified: Secondary | ICD-10-CM | POA: Diagnosis not present

## 2024-03-16 DIAGNOSIS — K7290 Hepatic failure, unspecified without coma: Secondary | ICD-10-CM | POA: Diagnosis not present

## 2024-03-16 DIAGNOSIS — I509 Heart failure, unspecified: Secondary | ICD-10-CM | POA: Diagnosis not present

## 2024-03-16 DIAGNOSIS — Z66 Do not resuscitate: Secondary | ICD-10-CM | POA: Insufficient documentation

## 2024-03-16 DIAGNOSIS — E1122 Type 2 diabetes mellitus with diabetic chronic kidney disease: Secondary | ICD-10-CM | POA: Diagnosis not present

## 2024-03-16 DIAGNOSIS — C220 Liver cell carcinoma: Secondary | ICD-10-CM | POA: Diagnosis not present

## 2024-03-16 DIAGNOSIS — I2510 Atherosclerotic heart disease of native coronary artery without angina pectoris: Secondary | ICD-10-CM | POA: Diagnosis not present

## 2024-03-16 DIAGNOSIS — K769 Liver disease, unspecified: Secondary | ICD-10-CM

## 2024-03-16 DIAGNOSIS — C227 Other specified carcinomas of liver: Secondary | ICD-10-CM | POA: Insufficient documentation

## 2024-03-16 DIAGNOSIS — N183 Chronic kidney disease, stage 3 unspecified: Secondary | ICD-10-CM | POA: Insufficient documentation

## 2024-03-16 DIAGNOSIS — C229 Malignant neoplasm of liver, not specified as primary or secondary: Secondary | ICD-10-CM | POA: Diagnosis not present

## 2024-03-16 LAB — CBC
HCT: 38.1 % — ABNORMAL LOW (ref 39.0–52.0)
Hemoglobin: 11.9 g/dL — ABNORMAL LOW (ref 13.0–17.0)
MCH: 26.6 pg (ref 26.0–34.0)
MCHC: 31.2 g/dL (ref 30.0–36.0)
MCV: 85.2 fL (ref 80.0–100.0)
Platelets: 208 10*3/uL (ref 150–400)
RBC: 4.47 MIL/uL (ref 4.22–5.81)
RDW: 19.9 % — ABNORMAL HIGH (ref 11.5–15.5)
WBC: 6.1 10*3/uL (ref 4.0–10.5)
nRBC: 0 % (ref 0.0–0.2)

## 2024-03-16 LAB — GLUCOSE, CAPILLARY
Glucose-Capillary: 86 mg/dL (ref 70–99)
Glucose-Capillary: 79 mg/dL (ref 70–99)

## 2024-03-16 LAB — PROTIME-INR
INR: 1.2 (ref 0.8–1.2)
Prothrombin Time: 15.7 s — ABNORMAL HIGH (ref 11.4–15.2)

## 2024-03-16 MED ORDER — SULFUR HEXAFLUORIDE MICROSPH 60.7-25 MG IJ SUSR
INTRAMUSCULAR | Status: AC
  Filled 2024-03-16: qty 5

## 2024-03-16 MED ORDER — FENTANYL CITRATE (PF) 100 MCG/2ML IJ SOLN
INTRAMUSCULAR | Status: AC
  Filled 2024-03-16: qty 2

## 2024-03-16 MED ORDER — LIDOCAINE HCL 1 % IJ SOLN
10.0000 mL | Freq: Once | INTRAMUSCULAR | Status: DC
Start: 2024-03-16 — End: 2024-03-17

## 2024-03-16 MED ORDER — MIDAZOLAM HCL 2 MG/2ML IJ SOLN
INTRAMUSCULAR | Status: AC | PRN
  Administered 2024-03-16 (×2): .5 mg via INTRAVENOUS

## 2024-03-16 MED ORDER — SULFUR HEXAFLUORIDE MICROSPH 60.7-25 MG IJ SUSR
INTRAMUSCULAR | Status: AC | PRN
  Administered 2024-03-16: 2.4 mL via INTRAVENOUS

## 2024-03-16 MED ORDER — SODIUM CHLORIDE 0.9 % IV BOLUS: 250.0000 mL | Freq: Once | INTRAVENOUS | Status: DC

## 2024-03-16 MED ORDER — DIPHENHYDRAMINE HCL 50 MG/ML IJ SOLN
INTRAMUSCULAR | Status: AC | PRN
  Administered 2024-03-16: 50 mg via INTRAVENOUS

## 2024-03-16 MED ORDER — DIPHENHYDRAMINE HCL 50 MG/ML IJ SOLN
INTRAMUSCULAR | Status: AC
  Filled 2024-03-16: qty 1

## 2024-03-16 MED ORDER — SULFUR HEXAFLUORIDE MICROSPH 60.7-25 MG IJ SUSR
5.0000 mL | Freq: Once | INTRAMUSCULAR | Status: DC | PRN
Start: 2024-03-16 — End: 2024-03-17

## 2024-03-16 MED ORDER — MIDAZOLAM HCL 2 MG/2ML IJ SOLN
INTRAMUSCULAR | Status: AC
  Filled 2024-03-16: qty 2

## 2024-03-16 NOTE — H&P (Signed)
 Chief Complaint: Patient was seen in consultation today for No chief complaint on file.  at the request of Roseline Conine  Referring Physician(s): Roseline Conine  Supervising Physician: Creasie Doctor  Patient Status: [facility] - Out-pt  DNR  History of Present Illness: Eduardo [name] is a 68 y.o. male with past medical history significant for hypertension, CAD, CKD stage III, and T2DM being evaluated for a liver lesion incidentally found on CT A/P while being treated as an inpatient for small bowel ischemia in January 2025. Initial CT revealed an 8.8 cm hypoattenuating lesion in the posterior right hepatic lobe. Follow up MR Adbomen on 3/28 revealed a large, rim enhancing, centrally hypoenhancing mass in the posterior right lobe of the liver measuring 11.0 x 8.4cm, increased in size from previous CT. This was felt to be consistent with a necrotic metastasis or primary liver mass. Subsequent lab evaluation completed on 4/8 revealed CA 19-9, AFP, CA 125,  hepatitis B serologies, an hepatitis C RNA levels to be within normal limits. He was referred to IR for biopsy of the right liver lesion and underwent initial, non-diagnostic, biopsy on 4/28 with Dr. Nicoletta Barrier. Patient returns to IR today for repeat biopsy of right liver lesion.   Patient reports being here for a repeat liver biopsy. Patient is feeling well. Patient is NPO and has not taken plavix  for at least 6 days. Denies: chest pain, shortness of breath, nausea, vomiting, abdominal pain, diarrhea, and/or fever.  Past Medical History:  Diagnosis Date   CHF (congestive heart failure) (HCC)    Chronic kidney disease    stage 3   Colon polyp    Coronary artery disease    Diabetes mellitus 1987   under care of Dr. [name].  On insulin  since 96 (off and on)   Diabetic retinopathy    Dupuytren contracture    R hand, s/p injection (Dr. Jonna Netter)   Essential hypertension, benign    Essential hypertension, benign 02/06/2019   Frequency of urination  and polyuria    Hypertension    Myocardial infarction [facility])    denies   Neuromuscular disorder (HCC)    Diabetic neuropathy   Osteomyelitis (HCC)    right foot   Other testicular hypofunction    Peripheral arterial disease (HCC) 10/28/2012   Peritoneal abscess (HCC) 6/08   and buttock.   Pneumonia    Polydipsia    Proteinuria    Pure hyperglyceridemia    Subacute osteomyelitis, right ankle and foot (HCC)    Wears glasses     Past Surgical History:  Procedure Laterality Date   ABDOMINAL AORTAGRAM N/A 04/18/2012   Procedure: ABDOMINAL Tommi Fraise;  Surgeon: Dannis Dy, MD;  Location: [facility] CATH LAB;  Service: Cardiovascular;  Laterality: N/A;   AMPUTATION Right 05/19/2019   Procedure: RIGHT FOOT 5TH RAY AMPUTATION;  Surgeon: Timothy Ford, MD;  Location: [facility] OR;  Service: Orthopedics;  Laterality: Right;   AMPUTATION Right 03/11/2022   Procedure: RIGHT LEG DEBRIDEMENT VS. BELOW KNEE AMPUTATION;  Surgeon: Timothy Ford, MD;  Location: [facility] OR;  Service: Orthopedics;  Laterality: Right;   BIV ICD INSERTION CRT-D N/A 10/21/2022   Procedure: BIV ICD INSERTION CRT-D;  Surgeon: Tammie Fall, MD;  Location: [facility] INVASIVE CV LAB;  Service: Cardiovascular;  Laterality: N/A;   BOWEL RESECTION N/A 11/14/2023   Procedure: SMALL BOWEL RESECTION;  Surgeon: Dorrie Gaudier, Alphonso Aschoff, MD;  Location: [facility] OR;  Service: General;  Laterality: N/A;   CARDIAC CATHETERIZATION N/A 09/08/2016  Procedure: Left Heart Cath and Coronary Angiography;  Surgeon: Knox Perl, MD;  Location: [facility] INVASIVE CV LAB;  Service: Cardiovascular;  Laterality: N/A;   CATARACT EXTRACTION, BILATERAL  09/2017, 10/2017   Dr. Lasandra Points   COLONOSCOPY W/ BIOPSIES AND POLYPECTOMY     CORONARY/GRAFT ACUTE MI REVASCULARIZATION N/A 10/11/2020   Procedure: Coronary/Graft Acute MI Revascularization;  Surgeon: Knox Perl, MD;  Location: [facility] INVASIVE CV LAB;  Service: Cardiovascular;  Laterality: N/A;   I & D EXTREMITY Right 03/11/2022    Procedure: BELOW KNEE AMPUTATION;  Surgeon: Timothy Ford, MD;  Location: [facility] OR;  Service: Orthopedics;  Laterality: Right;   INSERT / REPLACE / REMOVE PACEMAKER     LAPAROSCOPY N/A 11/14/2023   Procedure: LAPAROSCOPY DIAGNOSTIC;  Surgeon: Dorrie Gaudier, Alphonso Aschoff, MD;  Location: [facility] OR;  Service: General;  Laterality: N/A;  Converted to open ex-lap after inserting first trocar   LAPAROTOMY N/A 11/14/2023   Procedure: EXPLORATORY LAPAROTOMY;  Surgeon: Dorrie Gaudier Alphonso Aschoff, MD;  Location: [facility] OR;  Service: General;  Laterality: N/A;   LEFT HEART CATH N/A 12/31/2020   Procedure: Left Heart Cath;  Surgeon: Knox Perl, MD;  Location: [facility] INVASIVE CV LAB;  Service: Cardiovascular;  Laterality: N/A;   LEFT HEART CATH AND CORONARY ANGIOGRAPHY N/A 10/11/2020   Procedure: LEFT HEART CATH AND CORONARY ANGIOGRAPHY;  Surgeon: Knox Perl, MD;  Location: [facility] INVASIVE CV LAB;  Service: Cardiovascular;  Laterality: N/A;   LOWER EXTREMITY ANGIOGRAM Bilateral 04/18/2012   Procedure: LOWER EXTREMITY ANGIOGRAM;  Surgeon: Dannis Dy, MD;  Location: [facility] CATH LAB;  Service: Cardiovascular;  Laterality: Bilateral;  bilat lower extrem angio   LOWER EXTREMITY ANGIOGRAPHY Bilateral 05/02/2019   Procedure: LOWER EXTREMITY ANGIOGRAPHY;  Surgeon: Knox Perl, MD;  Location: [facility] INVASIVE CV LAB;  Service: Cardiovascular;  Laterality: Bilateral;   LOWER EXTREMITY ANGIOGRAPHY Bilateral 02/28/2019   Procedure: LOWER EXTREMITY ANGIOGRAPHY;  Surgeon: Knox Perl, MD;  Location: [facility] INVASIVE CV LAB;  Service: Cardiovascular;  Laterality: Bilateral;   macular photocoagulation     (eye treatments for diabetic retinopathy)-Dr. Augustus Ledger   PERIPHERAL VASCULAR INTERVENTION  02/28/2019   Procedure: PERIPHERAL VASCULAR INTERVENTION;  Surgeon: Knox Perl, MD;  Location: [facility] INVASIVE CV LAB;  Service: Cardiovascular;;   TEE WITHOUT CARDIOVERSION N/A 03/18/2022   Procedure: TRANSESOPHAGEAL ECHOCARDIOGRAM (TEE);  Surgeon: Knox Perl, MD;   Location: [facility] ENDOSCOPY;  Service: Cardiovascular;  Laterality: N/A;   VENTRICULAR ASSIST DEVICE INSERTION N/A 12/31/2020   Procedure: VENTRICULAR ASSIST DEVICE INSERTION;  Surgeon: Knox Perl, MD;  Location: [facility] INVASIVE CV LAB;  Service: Cardiovascular;  Laterality: N/A;    Allergies: Empagliflozin , Shellfish-derived products, Latex, Tape, and Testosterone   Medications: Prior to Admission medications   Medication Sig Start Date End Date Taking? Authorizing Provider  acetaminophen  (TYLENOL ) 325 MG tablet Take 1-2 tablets (325-650 mg total) by mouth every 4 (four) hours as needed for mild pain. 03/31/22  Yes Setzer, Sandra J, PA-C  Bioflavonoid Products (ESTER C PO) Take 1,000 mg by mouth daily.   Yes [provider]  Cholecalciferol  (VITAMIN D ) 2000 units CAPS Take 2,000 Units by mouth daily.   Yes [provider]  ezetimibe  (ZETIA ) 10 MG tablet TAKE 1 TABLET BY MOUTH DAILY 07/08/23  Yes Patwardhan, Manish J, MD  gemfibrozil  (LOPID ) 600 MG tablet Take 600 mg by mouth 2 (two) times daily before a meal.   Yes [provider]  HUMALOG KWIKPEN 100 UNIT/ML KiwkPen Inject 3-10 Units into the skin 3 (three) times daily. Inject 01-1009 units into  the skin three times a day, per sliding scale- based on BGL >100 08/02/16  Yes [provider]  insulin  NPH Human (NOVOLIN N) 100 UNIT/ML injection Inject 6 Units into the skin daily before breakfast.   Yes [provider]  Krill Oil 300 MG CAPS Take 500 mg by mouth daily.   Yes [provider]  lidocaine  (XYLOCAINE ) 5 % ointment Apply 1 Application topically as needed. For pain as needed- can use up to 4x/day 01/03/24  Yes Lovorn, Megan, MD  melatonin 10 MG TABS Take 10 mg by mouth at bedtime. 03/31/22  Yes Setzer, Sandra J, PA-C  Multiple Vitamin (MULTIVITAMIN WITH MINERALS) TABS tablet Take 1 tablet by mouth daily.   Yes [provider]  Probiotic Product (PROBIOTIC-10 PO) Take 1 capsule by mouth  daily.   Yes [provider]  rosuvastatin  (CRESTOR ) 20 MG tablet Take 1 tablet (20 mg total) by mouth daily. 07/22/20  Yes Knox Perl, MD  sacubitril -valsartan  (ENTRESTO ) 24-26 MG TAKE 1 TABLET BY MOUTH TWICE  DAILY 10/11/23  Yes Patwardhan, Manish J, MD  sodium chloride  (OCEAN) 0.65 % SOLN nasal spray Place 1 spray into both nostrils as needed for congestion.   Yes [provider]  torsemide  (DEMADEX ) 20 MG tablet Take 1 tablet (20 mg total) by mouth daily. Take additional one tablet by mouth as needed for swelling 08/24/23  Yes Patwardhan, Manish J, MD  clopidogrel  (PLAVIX ) 75 MG tablet Take 1 tablet (75 mg total) by mouth daily. 12/02/23   Patwardhan, Kaye Parsons, MD  MOUNJARO 10 MG/0.5ML Pen Inject 10 mg into the skin once a week. 02/03/23   [provider]  nitroGLYCERIN  (NITROSTAT ) 0.4 MG SL tablet Place 1 tablet (0.4 mg total) under the tongue every 5 (five) minutes as needed for chest pain. 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 Retired. 1 cat.  Girlfriend and her daughter lives with him. Enjoys fishing.   Updated 06/2021   Social Drivers of Health   Financial Resource Strain: Low Risk  (07/28/2023)  Overall Financial Resource Strain (CARDIA)    Difficulty of Paying Living Expenses: Not hard at all  Food Insecurity: No Food Insecurity (02/22/2024)   Hunger Vital Sign    Worried About Running Out of Food in the Last Year: Never true    Ran Out of Food in the Last Year: Never true  Transportation Needs: No Transportation Needs (02/22/2024)   PRAPARE - Administrator, Civil Service (Medical): No    Lack of Transportation (Non-Medical): No  Physical Activity: Sufficiently Active (07/28/2023)   Exercise Vital Sign    Days of Exercise per Week: 7 days    Minutes of Exercise per Session: 90 min  Stress: No Stress Concern Present (07/28/2023)   [employer] of Occupational Health - Occupational Stress Questionnaire    Feeling of Stress : Not at all  Social Connections: Socially Isolated (11/14/2023)   Social Connection and Isolation Panel [NHANES]    Frequency of Communication with Friends and Family: Never    Frequency of Social Gatherings with Friends and Family: Never    Attends Religious Services: Never    Database administrator or Organizations: No    Attends Engineer, structural: Never    Marital Status: Married     Review of Systems: A 12 point ROS discussed and pertinent positives are indicated in the HPI above.  All other systems are negative.  Review of Systems  Constitutional:  Negative for fever.  Respiratory:  Negative for cough and shortness of breath.   Cardiovascular:  Negative for chest pain.  Gastrointestinal:  Negative for abdominal pain,  diarrhea, nausea and vomiting.  Psychiatric/Behavioral:  Negative for confusion.     Vital Signs: BP (!) 85/61 (BP Location: Left Arm)   Pulse 79   Temp 97.8 F (36.6 C) (Oral)   Resp 16   Ht 5\' 9"  (1.753 m)   Wt 220 lb (99.8 kg)   SpO2 98%   BMI 32.49 kg/m   Advance Care Plan: The advanced care plan/surrogate decision maker was discussed at the time of visit and documented in the medical record.    Physical Exam HENT:     Head: Normocephalic and atraumatic.     Mouth/Throat:     Mouth: Mucous membranes are moist.     Pharynx: Oropharynx is clear.  Cardiovascular:     Rate and Rhythm: Normal rate and regular rhythm.  Pulmonary:     Effort: Pulmonary effort is normal.  Skin:    General: Skin is warm.  Neurological:     General: No focal deficit present.     Mental Status: He is alert and oriented to person, place, and time.     Imaging: CT Chest Wo Contrast Result Date: 03/09/2024 CLINICAL DATA:  Right hepatic mass EXAM: CT CHEST WITHOUT CONTRAST TECHNIQUE: Multidetector CT imaging of the chest was performed following the standard protocol without IV contrast. RADIATION DOSE REDUCTION: This exam was performed according to the departmental dose-optimization program which includes automated exposure control, adjustment of the mA and/or kV according to patient size and/or use of iterative reconstruction technique. COMPARISON:  February 14, 2024 FINDINGS: Cardiovascular: Mild cardiomegaly. Small pericardial effusion. Dense multi-vessel coronary atherosclerosis. Left chest pacemaker/AICD with leads terminating in the right atrium, right ventricle, and coronary sinus. No aortic aneurysm. Mediastinum/Nodes: No mediastinal mass. No mediastinal, hilar, or axillary lymphadenopathy. Lungs/Pleura: The midline trachea and bronchi are patent. Small right and moderate left pleural effusions with bibasilar compressive atelectasis. No pneumothorax or lobar consolidation.  Fibrolinear scarring in the  right middle lobe. Couple of scattered calcified granulomas in the right lower lobe. 4 mm anterior left upper lobe nodule (axial 52). 3 mm anterior subpleural left upper lobe nodule (axial 62). 4 mm right lower lobe nodule (axial 80). Musculoskeletal: No acute fracture or destructive bone lesion. Thoracic DISH. Anasarca. Upper Abdomen: Similar appearance of a large hypodense mass filling the posterior right hepatic lobe, which is less conspicuous due to the lack of intravenous contrast. IMPRESSION: 1. Mild cardiomegaly. Small right and moderate left pleural effusions with bibasilar compressive atelectasis. Anasarca is also present. These changes may be due to volume overload, possibly from CHF. No pulmonary edema or pneumonia. 2. A few scattered small lung nodules are present measuring up to 4 mm in the right lower and left upper lobes (axial 80 and axial 52). While these could be infectious or inflammatory, given the right hepatic lobe changes mass in the right hepatic lobe, this could represent early metastatic disease. Comparison with outside imaging is recommended to document stability. 3. Overall, similar appearance of the large hypodense mass filling the posterior right hepatic lobe, less conspicuous on today's exam due to the lack of intravenous contrast. Aortic Atherosclerosis (ICD10-I70.0). Electronically Signed   By: Rance Burrows M.D.   On: 03/09/2024 16:04   US  BIOPSY (LIVER) Result Date: 03/06/2024 CLINICAL DATA:  Enlarging right hepatic lesion.  No known primary EXAM: ULTRASOUND-GUIDED CORE LIVER LESION BIOPSY TECHNIQUE: An ultrasound guided liver biopsy was thoroughly discussed with the patient and questions were answered. The benefits, risks, alternatives, and complications were also discussed. The patient understands and wishes to proceed with the procedure. A verbal as well as written consent was obtained. Survey ultrasound of the liver was performed, lesion localized, and an appropriate skin  entry site was determined. Skin site was marked, prepped with chlorhexidine , and draped in usual sterile fashion, and infiltrated locally with 1% lidocaine . Intravenous Benadryl  25 mg, fentanyl  3mcg and Versed  1.5mg  were administered by RN during a total moderate (conscious) sedation time of 10 minutes; the patient's level of consciousness and physiological / cardiorespiratory status were monitored continuously by radiology RN under my direct supervision. A 17 gauge trocar needle was advanced under ultrasound guidance into the liver to the margin of the lesion. coaxial 18gauge core samples were then obtained through the guide needle. The guide needle was removed. Post procedure scans demonstrate no apparent complication. COMPLICATIONS: COMPLICATIONS None immediate FINDINGS: Heterogenous right hepatic lesion localized corresponding to CT findings. Representative core biopsy samples obtained as above. IMPRESSION: 1. Technically successful ultrasound guided core liver lesion biopsy. Electronically Signed   By: Nicoletta Barrier M.D.   On: 03/06/2024 12:28   CUP PACEART INCLINIC DEVICE CHECK Result Date: 03/02/2024 Normal in-clinic CRT-D (multi-lead) check. Presenting Rhythm: AS-VP(BIV) . Routine testing was performed. Thresholds, sensing, impedance trend were stable and no changes were required. HF diagnostics are stable. No treated arrhythmias. Patient BiV pacing  95 % of the time. Estimated longevity 23yr74mo. Pt enrolled in remote follow-up.   Labs:  CBC: Recent Labs    11/17/23 0642 11/18/23 0626 03/06/24 0657 03/16/24 0736  WBC 8.8 6.7 7.5 6.1  HGB 10.4* 10.6* 12.7* 11.9*  HCT 32.2* 33.1* 40.8 38.1*  PLT 161 188 229 208    COAGS: Recent Labs    03/06/24 0657 03/16/24 0736  INR 1.2 1.2    BMP: Recent Labs    11/16/23 0341 11/17/23 0642 11/18/23 0626 03/06/24 0657  NA 141 141 140 138  K  4.0 3.6 4.1 4.2  CL 106 104 104 103  CO2 24 24 27 24   GLUCOSE 164* 175* 179* 134*  BUN 40* 34* 28*  42*  CALCIUM  8.6* 8.5* 8.4* 9.2  CREATININE 1.51* 1.32* 1.40* 1.58*  GFRNONAA 50* 59* 55* 47*    LIVER FUNCTION TESTS: Recent Labs    10/19/23 0000 11/13/23 2230 03/06/24 0657  BILITOT  --  1.5* 0.9  AST 22 34 32  ALT 22 30 17   ALKPHOS 85 77 79  PROT  --  7.3 6.6  ALBUMIN   --  4.0 3.4*    TUMOR MARKERS: No results for input(s): "AFPTM", "CEA", "CA199", "CHROMGRNA" in the last 8760 hours.  Assessment and Plan: Liver Lesion   Patient is a 68 y/o male with history hypertension, CAD, CKD stage III, and T2DM being evaluated for a liver lesion incidentally found on CT A/P while being treated as an  inpatient for small bowel ischemia in January 2025. Patient underwent liver lesion biopsy 03/06/24, which was non diagnostic. Patient presents today for a repeat biopsy. Patient is NPO and has not taken plavix  for at least 6 days. Denies: chest pain, shortness of breath, nausea, vomiting, abdominal pain, diarrhea, and/or fever.  Risks and benefits of liver biopsy was discussed with the patient and/or patient's family including, but not limited to bleeding, infection, damage to adjacent structures or low yield requiring additional tests.  All of the questions were answered and there is agreement to proceed.  Consent signed and in chart.   Thank you for this interesting consult.  I greatly enjoyed meeting JAMAUL FENDLEY and look forward to participating in their care.  A copy of this report was sent to the requesting provider on this date.  Electronically Signed: Lawrance Presume, PA 03/16/2024, 8:46 AM   I spent a total of    15 Minutes in face to face in clinical consultation, greater than 50% of which was counseling/coordinating care for liver lesion

## 2024-03-16 NOTE — Sedation Documentation (Signed)
 250cc NSS bolus given per MD Suttle.

## 2024-03-16 NOTE — Procedures (Signed)
 Interventional Radiology Procedure Note  Procedure:  Contrast enhanced ultrasound guided liver mass biopsy  Findings: Please refer to procedural dictation for full description. 18 ga core x4 from right hepatic mass. Gelfoam slurry needle track embolization.  Complications: None immediate  Estimated Blood Loss: < 5 mL  Recommendations: Strict 3 hour bedrest. Follow up Pathology results.   Creasie Doctor, MD

## 2024-03-21 ENCOUNTER — Ambulatory Visit: Attending: Cardiology | Admitting: Cardiology

## 2024-03-21 ENCOUNTER — Encounter: Payer: Self-pay | Admitting: Cardiology

## 2024-03-21 VITALS — BP 95/64 | HR 89 | Resp 16 | Ht 69.0 in | Wt 227.6 lb

## 2024-03-21 DIAGNOSIS — I25118 Atherosclerotic heart disease of native coronary artery with other forms of angina pectoris: Secondary | ICD-10-CM

## 2024-03-21 DIAGNOSIS — I5043 Acute on chronic combined systolic (congestive) and diastolic (congestive) heart failure: Secondary | ICD-10-CM

## 2024-03-21 LAB — SURGICAL PATHOLOGY

## 2024-03-21 MED ORDER — BUMETANIDE 2 MG PO TABS: 2.0000 mg | ORAL_TABLET | Freq: Two times a day (BID) | ORAL | 3 refills | Status: DC

## 2024-03-21 NOTE — Patient Instructions (Signed)
  PLAN FOR ELECTIVE ADMISSION TO HOSPITAL   Medication Instructions:  STOP Torsemide    START Bumex 2 mg twice daily  *If you need a refill on your cardiac medications before your next appointment, please call your pharmacy*  Lab Work: Bmp probnp  If you have labs (blood work) drawn today and your tests are completely normal, you will receive your results only by: MyChart Message (if you have MyChart) OR A paper copy in the mail If you have any lab test that is abnormal or we need to change your treatment, we will call you to review the results.  Follow-Up: At [facility], you and your health needs are our priority.  As part of our continuing mission to provide you with exceptional heart care, our providers are all part of one team.  This team includes your primary Cardiologist (physician) and Advanced Practice Providers or APPs (Physician Assistants and Nurse Practitioners) who all work together to provide you with the care you need, when you need it.  Your next appointment:   4 week(s)  Provider:   One of our Advanced Practice Providers (APPs): Melita Springer, PA-C  Friddie Jetty, NP Evaline Hill, NP  Theotis Flake, PA-C Lawana Pray, NP  Willis Harter, PA-C Lovette Rud, PA-C  [place], [place] Charles Connor, NP  Marlana Silvan, NP Marcie Sever, PA-C  Laquita Plant, PA-C    Dayna Dunn, PA-C  Marlyse Single, PA-C Palmer Bobo, NP Katlyn West, NP Callie Goodrich, PA-C  Evan Williams, PA-C Sheng Haley, PA-C  Xika Zhao, NP Kathleen Johnson, PA-C

## 2024-03-21 NOTE — Progress Notes (Signed)
 Cardiology Office Note:  .   Date:  03/21/2024  ID:  Eduardo [name], DOB [birth year], MRN [id number] PCP: Roosvelt Colla, MD  [facility] Providers Cardiologist:  Fransico Ivy, MD PCP: Roosvelt Colla, MD  Chief Complaint  Patient presents with   Coronary artery disease involving native coronary artery of   Follow-up      History of Present Illness: .    Eduardo [name] is a 68 y.o. male with hypertension, hyperlipidemia, type 2 DM, extensive CAD, PAD s/p Rt BKA, HFrEF, ischemic cardiomyopathy s/p BiV ICD placement  Since his last visit with me, he underwent small bowel resection and anastomosis for small bowel obstruction (11/2023).  Subsequently, he was also found to have a liver mass. He has been seeing oncologist Dr. Marianna Shirk for the same, and underwent biopsy last week.  He has not had subsequent discussion with Dr. Scherrie Curt regarding the biopsy results and further management plan.    While patient did not have any acute perioperative cardiac events, it does appear that he has been more tired and fatigued, along with severe swelling in his legs and scrotal area ever since the surgery.  Recent monitoring of BiV ICD shows moderate volume overload.  He has increased his torsemide  use from 20 mg as needed to 40 mg in the morning and 40 mg in the afternoon without much improvement in his leg swelling.    Vitals:   03/21/24 1309  BP: 95/64  Pulse: 89  Resp: 16  SpO2: 99%      ROS:  Review of Systems  Constitutional: Positive for malaise/fatigue.  Cardiovascular:  Positive for leg swelling. Negative for chest pain, dyspnea on exertion, palpitations and syncope.     Studies Reviewed: Eduardo [name]       Independently interpreted 02/2024: Chol 83, TG 82, HDL 37, LDL 29 HbA1C 6.6% Hb 11.9 Cr 1.58, eGFR 47  Echocardiogram 09/2023: Eduardo [name] Left ventricular ejection fraction, by estimation, is 30 to 35%. The  left ventricle has moderately decreased function. The left ventricle  demonstrates  regional wall motion abnormalities (see scoring  diagram/findings for description). Left ventricular   diastolic function could not be evaluated.   2. Right ventricular systolic function is normal. The right ventricular  size is moderately enlarged. There is moderately elevated pulmonary artery  systolic pressure.   3. Left atrial size was mildly dilated.   4. The mitral valve is normal in structure. Moderate to severe mitral  valve regurgitation. No evidence of mitral stenosis.   5. The aortic valve is calcified. Aortic valve regurgitation is not  visualized. Aortic valve sclerosis/calcification is present, without any  evidence of aortic stenosis.   6. The inferior vena cava is normal in size with greater than 50%  respiratory variability, suggesting right atrial pressure of 3 mmHg.   Left Heart Catheterization 10/11/20:  LV: Severely dilated.  Global hypokinesis.  Hand contrast injection hence not fully adequately visualized.  LVEF 15 to 20%.  EDP markedly elevated at 29 mmHg.  No pressure gradient across the aortic valve. Left main: Normal. LAD: Severely diffusely diseased.  Gives origin to large D1 which gives collaterals to the LAD and 2 smaller diagonals, LAD is occluded after the origin of D1, anatomy compared to prior in 2017 reveals progression of diffuse disease.  There are ipsilateral and contralateral collaterals to the LAD. CX: Moderate sized vessel, giving origin to large OM1.  OM1 is occluded in the ostium as was noted previously and has ipsilateral collaterals.  OM1 is diffusely diseased and faintly filled. RCA: Moderate disease in the proximal and mid segment.  At the bifurcation of PDA and PL, there is a high-grade 90% stenosis.  The bifurcation is also involved with at least a 90% stenosis in the PDA and a 60 to 70% stenosis in the PL branch.  There is no target as distally as the PL branch which is large is occluded distally that was also noted in 2017.  There are faint  collaterals noted from the left to the RCA.   Impression: Severe native vessel three-vessel coronary artery disease with no significant targets for revascularization, LAD may be amenable for revascularization but I am not sure that this would help.  Patient is extremely ill with high risk for mortality.  He also has underlying stage III-IV kidney disease and contrast nephropathy needs to be evaluated further in view of contrast load of 70 mL. I try to reach his friend on the contact list, unable to reach.   Physical Exam:   Physical Exam Vitals and nursing note reviewed.  Constitutional:      General: He is not in acute distress. Neck:     Vascular: No JVD.  Cardiovascular:     Rate and Rhythm: Normal rate and regular rhythm.     Heart sounds: Normal heart sounds. No murmur heard. Pulmonary:     Effort: Pulmonary effort is normal.     Breath sounds: Normal breath sounds. No wheezing or rales.  Genitourinary:    Comments: Scrotal edema Musculoskeletal:     Left lower leg: No edema (3+).     Comments: Rt BKA       VISIT DIAGNOSES:   ICD-10-CM   1. Acute on chronic combined systolic and diastolic CHF (congestive heart failure) (HCC)  I50.43 Basic metabolic panel with GFR    Pro b natriuretic peptide (BNP)    Pro b natriuretic peptide (BNP)    Basic metabolic panel with GFR    2. Coronary artery disease of native artery of native heart with stable angina pectoris [facility])  I25.118         ASSESSMENT AND PLAN: .    Eduardo [name] is a 68 y.o. male with hypertension, hyperlipidemia, type 2 DM, extensive CAD, PAD s/p Rt BKA, HFrEF, ischemic cardiomyopathy s/p BiV ICD placement  HFrEF: Ischemic cardiomyopathy.  EF 30 to 35% (09/2023). NYHA class III with volume overload. While he was fairly euvolemic at his last office visit 6 months ago, I suspect he has never recovered after undergoing major surgery of bowel resection, and concurrent volume shifts that may have occurred. He is  not very responsive to torsemide  40 mg twice daily.  Blood pressure is low normal, GFR is 47, limiting aggressive uptitration of oral GDMT for HFrEF. With new diagnosis of liver mass, and potential possibility that he may need another major surgery in the near future, I am concerned with risk of further exacerbation of his ongoing acute on chronic HFrEF. I recommend elective hospitalization for IV diuresis with anticipated hospital day of 3 days.  Hopefully, after adequate diuresis, we may be able to get him on some more GDMT, in addition to Entresto  24-26 mg twice daily that he is currently on.  I will send my note to Dr. Scherrie Curt in order to time his hospitalization.  Of course, I would not want to delay any upcoming surgery for his liver mass, but also would like to give him the best chance of recovering from  it without any further exacerbation of his heart failure.  Patient is hoping that he may be able to call by this Friday, 03/24/2024, after his upcoming appointments, and hopefully looking at hospitalization this coming weekend.  We will need to notify the on-call cardiologist to facilitate his elective hospitalization.  In the meantime, I will change his torsemide  to Bumex 2 mg twice daily with the hopes of better bioavailability and diuresis.  If he does not get admitted to the hospital, I would like him to check his BMP and proBNP on 03/24/2024.  CAD: Severe multivessel CAD with no good targets for either percutaneous or surgical revascularization due to small caliber and diffuse nature of the disease.  Fortunately, no anginal symptoms at this time. Currently on Plavix  monotherapy.  Continue the same.  This can be helped 5 days before any upcoming surgery. Lipids well-controlled.   No orders of the defined types were placed in this encounter.    F/u in 6 months  Signed, Cody Das, MD

## 2024-03-22 ENCOUNTER — Telehealth: Payer: Self-pay | Admitting: *Deleted

## 2024-03-22 DIAGNOSIS — E1165 Type 2 diabetes mellitus with hyperglycemia: Secondary | ICD-10-CM | POA: Diagnosis not present

## 2024-03-22 NOTE — Telephone Encounter (Signed)
 Notified Eduardo [name] that Dr. Scherrie Curt wants to have her seen on 03/24/24 at 0815 for 1 hour. He agrees to this appointment.

## 2024-03-23 ENCOUNTER — Encounter: Payer: Self-pay | Admitting: *Deleted

## 2024-03-23 DIAGNOSIS — R160 Hepatomegaly, not elsewhere classified: Secondary | ICD-10-CM | POA: Diagnosis not present

## 2024-03-23 DIAGNOSIS — I129 Hypertensive chronic kidney disease with stage 1 through stage 4 chronic kidney disease, or unspecified chronic kidney disease: Secondary | ICD-10-CM | POA: Diagnosis not present

## 2024-03-23 DIAGNOSIS — I5022 Chronic systolic (congestive) heart failure: Secondary | ICD-10-CM | POA: Diagnosis not present

## 2024-03-23 DIAGNOSIS — E1122 Type 2 diabetes mellitus with diabetic chronic kidney disease: Secondary | ICD-10-CM | POA: Diagnosis not present

## 2024-03-23 DIAGNOSIS — N1831 Chronic kidney disease, stage 3a: Secondary | ICD-10-CM | POA: Diagnosis not present

## 2024-03-23 NOTE — Progress Notes (Signed)
 PATIENT NAVIGATOR PROGRESS NOTE  Name: Eduardo [name] Date: 03/23/2024 MRN: [id number]  DOB: 1956-10-16   Reason for visit:  Molecular testing ordered  Comments:   Foundation one testing requested on accession number [id number]    Time spent counseling/coordinating care: 30-45 minutes

## 2024-03-24 ENCOUNTER — Encounter (HOSPITAL_COMMUNITY): Payer: Self-pay

## 2024-03-24 ENCOUNTER — Inpatient Hospital Stay (HOSPITAL_COMMUNITY)
Admission: RE | Admit: 2024-03-24 | Discharge: 2024-04-04 | DRG: 286 | Disposition: A | Discharge status: Home / Self Care | Source: Ambulatory Visit | Attending: Cardiology | Admitting: Cardiology

## 2024-03-24 ENCOUNTER — Encounter: Payer: Self-pay | Admitting: Nurse Practitioner

## 2024-03-24 ENCOUNTER — Inpatient Hospital Stay: Admit: 2024-03-24

## 2024-03-24 ENCOUNTER — Telehealth: Payer: Self-pay | Admitting: Cardiology

## 2024-03-24 ENCOUNTER — Encounter: Payer: Self-pay | Admitting: Cardiology

## 2024-03-24 ENCOUNTER — Inpatient Hospital Stay: Admitting: Nurse Practitioner

## 2024-03-24 VITALS — BP 88/62 | HR 87 | Temp 97.7°F | Resp 18 | Wt 228.4 lb

## 2024-03-24 DIAGNOSIS — E781 Pure hyperglyceridemia: Secondary | ICD-10-CM | POA: Diagnosis not present

## 2024-03-24 DIAGNOSIS — R0989 Other specified symptoms and signs involving the circulatory and respiratory systems: Secondary | ICD-10-CM | POA: Diagnosis not present

## 2024-03-24 DIAGNOSIS — I9589 Other hypotension: Secondary | ICD-10-CM | POA: Diagnosis present

## 2024-03-24 DIAGNOSIS — E1140 Type 2 diabetes mellitus with diabetic neuropathy, unspecified: Secondary | ICD-10-CM | POA: Diagnosis not present

## 2024-03-24 DIAGNOSIS — R0982 Postnasal drip: Secondary | ICD-10-CM | POA: Diagnosis not present

## 2024-03-24 DIAGNOSIS — I130 Hypertensive heart and chronic kidney disease with heart failure and stage 1 through stage 4 chronic kidney disease, or unspecified chronic kidney disease: Principal | ICD-10-CM | POA: Diagnosis present

## 2024-03-24 DIAGNOSIS — I5043 Acute on chronic combined systolic (congestive) and diastolic (congestive) heart failure: Principal | ICD-10-CM | POA: Diagnosis present

## 2024-03-24 DIAGNOSIS — I5082 Biventricular heart failure: Secondary | ICD-10-CM | POA: Diagnosis present

## 2024-03-24 DIAGNOSIS — E876 Hypokalemia: Secondary | ICD-10-CM | POA: Diagnosis present

## 2024-03-24 DIAGNOSIS — I950 Idiopathic hypotension: Secondary | ICD-10-CM | POA: Diagnosis not present

## 2024-03-24 DIAGNOSIS — R918 Other nonspecific abnormal finding of lung field: Secondary | ICD-10-CM | POA: Diagnosis not present

## 2024-03-24 DIAGNOSIS — I2510 Atherosclerotic heart disease of native coronary artery without angina pectoris: Secondary | ICD-10-CM | POA: Diagnosis present

## 2024-03-24 DIAGNOSIS — I2722 Pulmonary hypertension due to left heart disease: Secondary | ICD-10-CM | POA: Diagnosis not present

## 2024-03-24 DIAGNOSIS — R339 Retention of urine, unspecified: Secondary | ICD-10-CM | POA: Diagnosis not present

## 2024-03-24 DIAGNOSIS — I700 Atherosclerosis of aorta: Secondary | ICD-10-CM | POA: Diagnosis not present

## 2024-03-24 DIAGNOSIS — Z87891 Personal history of nicotine dependence: Secondary | ICD-10-CM

## 2024-03-24 DIAGNOSIS — E1122 Type 2 diabetes mellitus with diabetic chronic kidney disease: Secondary | ICD-10-CM | POA: Diagnosis present

## 2024-03-24 DIAGNOSIS — I509 Heart failure, unspecified: Principal | ICD-10-CM

## 2024-03-24 DIAGNOSIS — I255 Ischemic cardiomyopathy: Secondary | ICD-10-CM | POA: Diagnosis not present

## 2024-03-24 DIAGNOSIS — E1165 Type 2 diabetes mellitus with hyperglycemia: Secondary | ICD-10-CM | POA: Diagnosis present

## 2024-03-24 DIAGNOSIS — J9811 Atelectasis: Secondary | ICD-10-CM | POA: Diagnosis not present

## 2024-03-24 DIAGNOSIS — I5084 End stage heart failure: Secondary | ICD-10-CM | POA: Diagnosis not present

## 2024-03-24 DIAGNOSIS — N179 Acute kidney failure, unspecified: Secondary | ICD-10-CM | POA: Diagnosis not present

## 2024-03-24 DIAGNOSIS — I25810 Atherosclerosis of coronary artery bypass graft(s) without angina pectoris: Secondary | ICD-10-CM | POA: Diagnosis not present

## 2024-03-24 DIAGNOSIS — I5022 Chronic systolic (congestive) heart failure: Secondary | ICD-10-CM

## 2024-03-24 DIAGNOSIS — C220 Liver cell carcinoma: Secondary | ICD-10-CM | POA: Diagnosis not present

## 2024-03-24 DIAGNOSIS — E11319 Type 2 diabetes mellitus with unspecified diabetic retinopathy without macular edema: Secondary | ICD-10-CM | POA: Diagnosis present

## 2024-03-24 DIAGNOSIS — I25118 Atherosclerotic heart disease of native coronary artery with other forms of angina pectoris: Secondary | ICD-10-CM | POA: Diagnosis present

## 2024-03-24 DIAGNOSIS — Z950 Presence of cardiac pacemaker: Secondary | ICD-10-CM | POA: Diagnosis not present

## 2024-03-24 DIAGNOSIS — K5900 Constipation, unspecified: Secondary | ICD-10-CM | POA: Diagnosis not present

## 2024-03-24 DIAGNOSIS — I252 Old myocardial infarction: Secondary | ICD-10-CM

## 2024-03-24 DIAGNOSIS — R570 Cardiogenic shock: Secondary | ICD-10-CM | POA: Diagnosis not present

## 2024-03-24 DIAGNOSIS — C221 Intrahepatic bile duct carcinoma: Secondary | ICD-10-CM | POA: Diagnosis not present

## 2024-03-24 DIAGNOSIS — E119 Type 2 diabetes mellitus without complications: Secondary | ICD-10-CM | POA: Diagnosis not present

## 2024-03-24 DIAGNOSIS — Z794 Long term (current) use of insulin: Secondary | ICD-10-CM | POA: Diagnosis not present

## 2024-03-24 DIAGNOSIS — Z79899 Other long term (current) drug therapy: Secondary | ICD-10-CM

## 2024-03-24 DIAGNOSIS — I959 Hypotension, unspecified: Secondary | ICD-10-CM | POA: Diagnosis not present

## 2024-03-24 DIAGNOSIS — R609 Edema, unspecified: Secondary | ICD-10-CM | POA: Diagnosis not present

## 2024-03-24 DIAGNOSIS — Z7985 Long-term (current) use of injectable non-insulin antidiabetic drugs: Secondary | ICD-10-CM

## 2024-03-24 DIAGNOSIS — Z7902 Long term (current) use of antithrombotics/antiplatelets: Secondary | ICD-10-CM

## 2024-03-24 DIAGNOSIS — Z66 Do not resuscitate: Secondary | ICD-10-CM | POA: Diagnosis not present

## 2024-03-24 DIAGNOSIS — Z89511 Acquired absence of right leg below knee: Secondary | ICD-10-CM

## 2024-03-24 DIAGNOSIS — R160 Hepatomegaly, not elsewhere classified: Secondary | ICD-10-CM | POA: Diagnosis not present

## 2024-03-24 DIAGNOSIS — N1832 Chronic kidney disease, stage 3b: Secondary | ICD-10-CM | POA: Diagnosis present

## 2024-03-24 DIAGNOSIS — E1151 Type 2 diabetes mellitus with diabetic peripheral angiopathy without gangrene: Secondary | ICD-10-CM | POA: Diagnosis present

## 2024-03-24 DIAGNOSIS — R739 Hyperglycemia, unspecified: Secondary | ICD-10-CM | POA: Diagnosis not present

## 2024-03-24 DIAGNOSIS — I5023 Acute on chronic systolic (congestive) heart failure: Secondary | ICD-10-CM | POA: Diagnosis not present

## 2024-03-24 DIAGNOSIS — E871 Hypo-osmolality and hyponatremia: Secondary | ICD-10-CM | POA: Diagnosis not present

## 2024-03-24 DIAGNOSIS — Z95810 Presence of automatic (implantable) cardiac defibrillator: Secondary | ICD-10-CM

## 2024-03-24 DIAGNOSIS — E1139 Type 2 diabetes mellitus with other diabetic ophthalmic complication: Secondary | ICD-10-CM | POA: Diagnosis not present

## 2024-03-24 DIAGNOSIS — N1831 Chronic kidney disease, stage 3a: Secondary | ICD-10-CM | POA: Diagnosis not present

## 2024-03-24 DIAGNOSIS — I5020 Unspecified systolic (congestive) heart failure: Secondary | ICD-10-CM

## 2024-03-24 DIAGNOSIS — J90 Pleural effusion, not elsewhere classified: Secondary | ICD-10-CM | POA: Diagnosis not present

## 2024-03-24 DIAGNOSIS — I472 Ventricular tachycardia, unspecified: Secondary | ICD-10-CM | POA: Diagnosis not present

## 2024-03-24 DIAGNOSIS — I4729 Other ventricular tachycardia: Secondary | ICD-10-CM | POA: Diagnosis not present

## 2024-03-24 DIAGNOSIS — Z91040 Latex allergy status: Secondary | ICD-10-CM

## 2024-03-24 LAB — COMPREHENSIVE METABOLIC PANEL WITH GFR
ALT: 12 U/L (ref 0–44)
AST: 21 U/L (ref 15–41)
Albumin: 2.9 g/dL — ABNORMAL LOW (ref 3.5–5.0)
Alkaline Phosphatase: 61 U/L (ref 38–126)
Anion gap: 12 (ref 5–15)
BUN: 49 mg/dL — ABNORMAL HIGH (ref 8–23)
CO2: 22 mmol/L (ref 22–32)
Calcium: 8.4 mg/dL — ABNORMAL LOW (ref 8.9–10.3)
Chloride: 102 mmol/L (ref 98–111)
Creatinine, Ser: 1.59 mg/dL — ABNORMAL HIGH (ref 0.61–1.24)
GFR, Estimated: 47 mL/min — ABNORMAL LOW (ref >60)
Glucose, Bld: 115 mg/dL — ABNORMAL HIGH (ref 70–99)
Potassium: 3.2 mmol/L — ABNORMAL LOW (ref 3.5–5.1)
Sodium: 136 mmol/L (ref 135–145)
Total Bilirubin: 0.8 mg/dL (ref 0.0–1.2)
Total Protein: 5.2 g/dL — ABNORMAL LOW (ref 6.5–8.1)

## 2024-03-24 LAB — HEMOGLOBIN A1C
Hgb A1c MFr Bld: 6.7 % — ABNORMAL HIGH (ref 4.8–5.6)
Mean Plasma Glucose: 145.59 mg/dL

## 2024-03-24 LAB — CBC
HCT: 33.7 % — ABNORMAL LOW (ref 39.0–52.0)
Hemoglobin: 10.9 g/dL — ABNORMAL LOW (ref 13.0–17.0)
MCH: 26.8 pg (ref 26.0–34.0)
MCHC: 32.3 g/dL (ref 30.0–36.0)
MCV: 83 fL (ref 80.0–100.0)
Platelets: 180 10*3/uL (ref 150–400)
RBC: 4.06 MIL/uL — ABNORMAL LOW (ref 4.22–5.81)
RDW: 19.4 % — ABNORMAL HIGH (ref 11.5–15.5)
WBC: 5.6 10*3/uL (ref 4.0–10.5)
nRBC: 0 % (ref 0.0–0.2)

## 2024-03-24 LAB — GLUCOSE, CAPILLARY: Glucose-Capillary: 121 mg/dL — ABNORMAL HIGH (ref 70–99)

## 2024-03-24 LAB — BRAIN NATRIURETIC PEPTIDE: B Natriuretic Peptide: 4192.8 pg/mL — ABNORMAL HIGH (ref 0.0–100.0)

## 2024-03-24 MED ORDER — ROSUVASTATIN CALCIUM 20 MG PO TABS
20.0000 mg | ORAL_TABLET | Freq: Every day | ORAL | Status: DC
  Administered 2024-03-25 – 2024-04-04 (×11): 20 mg via ORAL
  Filled 2024-03-24 (×11): qty 1

## 2024-03-24 MED ORDER — ADULT MULTIVITAMIN W/MINERALS CH
1.0000 | ORAL_TABLET | Freq: Every day | ORAL | Status: DC
  Administered 2024-03-25 – 2024-04-04 (×11): 1 via ORAL
  Filled 2024-03-24 (×11): qty 1

## 2024-03-24 MED ORDER — MELATONIN 5 MG PO TABS
10.0000 mg | ORAL_TABLET | Freq: Every day | ORAL | Status: DC
  Administered 2024-03-24 – 2024-03-28 (×5): 10 mg via ORAL
  Filled 2024-03-24 (×5): qty 2

## 2024-03-24 MED ORDER — ONDANSETRON HCL 4 MG/2ML IJ SOLN: 4.0000 mg | Freq: Four times a day (QID) | INTRAMUSCULAR | Status: DC | PRN

## 2024-03-24 MED ORDER — GEMFIBROZIL 600 MG PO TABS
600.0000 mg | ORAL_TABLET | Freq: Two times a day (BID) | ORAL | Status: DC
  Administered 2024-03-25 – 2024-03-28 (×7): 600 mg via ORAL
  Filled 2024-03-24 (×8): qty 1

## 2024-03-24 MED ORDER — CLOPIDOGREL BISULFATE 75 MG PO TABS
75.0000 mg | ORAL_TABLET | Freq: Every day | ORAL | Status: DC
  Administered 2024-03-25 – 2024-04-04 (×11): 75 mg via ORAL
  Filled 2024-03-24 (×11): qty 1

## 2024-03-24 MED ORDER — SODIUM CHLORIDE 0.9% FLUSH: 3.0000 mL | INTRAVENOUS | Status: DC | PRN

## 2024-03-24 MED ORDER — VITAMIN D 25 MCG (1000 UNIT) PO TABS
2000.0000 [IU] | ORAL_TABLET | Freq: Every day | ORAL | Status: DC
  Administered 2024-03-25 – 2024-04-04 (×11): 2000 [IU] via ORAL
  Filled 2024-03-24 (×11): qty 2

## 2024-03-24 MED ORDER — FUROSEMIDE 10 MG/ML IJ SOLN
100.0000 mg | Freq: Two times a day (BID) | INTRAVENOUS | Status: DC
  Filled 2024-03-24 (×2): qty 10

## 2024-03-24 MED ORDER — SODIUM CHLORIDE 0.9% FLUSH
3.0000 mL | Freq: Two times a day (BID) | INTRAVENOUS | Status: DC
  Administered 2024-03-24 – 2024-04-04 (×19): 3 mL via INTRAVENOUS

## 2024-03-24 MED ORDER — ACETAMINOPHEN 325 MG PO TABS
650.0000 mg | ORAL_TABLET | ORAL | Status: DC | PRN
  Administered 2024-04-01 – 2024-04-03 (×4): 650 mg via ORAL
  Filled 2024-03-24 (×4): qty 2

## 2024-03-24 MED ORDER — INSULIN ASPART 100 UNIT/ML IJ SOLN
0.0000 [IU] | Freq: Three times a day (TID) | INTRAMUSCULAR | Status: DC
  Administered 2024-03-25: 4 [IU] via SUBCUTANEOUS
  Administered 2024-03-25: 3 [IU] via SUBCUTANEOUS
  Administered 2024-03-26 (×3): 4 [IU] via SUBCUTANEOUS
  Administered 2024-03-27: 3 [IU] via SUBCUTANEOUS
  Administered 2024-03-27 – 2024-03-28 (×3): 4 [IU] via SUBCUTANEOUS
  Administered 2024-03-28 (×2): 3 [IU] via SUBCUTANEOUS
  Administered 2024-03-29: 11 [IU] via SUBCUTANEOUS
  Administered 2024-03-29 – 2024-03-30 (×3): 4 [IU] via SUBCUTANEOUS
  Administered 2024-03-30: 7 [IU] via SUBCUTANEOUS
  Administered 2024-03-30: 11 [IU] via SUBCUTANEOUS
  Administered 2024-03-31: 3 [IU] via SUBCUTANEOUS
  Administered 2024-03-31 – 2024-04-01 (×3): 7 [IU] via SUBCUTANEOUS
  Administered 2024-04-01: 4 [IU] via SUBCUTANEOUS
  Administered 2024-04-02: 3 [IU] via SUBCUTANEOUS
  Administered 2024-04-02 – 2024-04-03 (×2): 4 [IU] via SUBCUTANEOUS
  Administered 2024-04-03: 11 [IU] via SUBCUTANEOUS
  Administered 2024-04-04: 3 [IU] via SUBCUTANEOUS

## 2024-03-24 MED ORDER — EZETIMIBE 10 MG PO TABS
10.0000 mg | ORAL_TABLET | Freq: Every day | ORAL | Status: DC
  Administered 2024-03-25 – 2024-04-04 (×11): 10 mg via ORAL
  Filled 2024-03-24 (×11): qty 1

## 2024-03-24 MED ORDER — SACUBITRIL-VALSARTAN 24-26 MG PO TABS: 1.0000 | ORAL_TABLET | Freq: Two times a day (BID) | ORAL | Status: DC

## 2024-03-24 MED ORDER — SODIUM CHLORIDE 0.9 % IV SOLN: 250.0000 mL | INTRAVENOUS | Status: AC | PRN

## 2024-03-24 MED ORDER — INSULIN ASPART 100 UNIT/ML IJ SOLN
0.0000 [IU] | Freq: Every day | INTRAMUSCULAR | Status: DC
Start: 2024-03-24 — End: 2024-04-04
  Administered 2024-04-03: 2 [IU] via SUBCUTANEOUS

## 2024-03-24 MED ORDER — FUROSEMIDE 10 MG/ML IJ SOLN: 80.0000 mg | Freq: Two times a day (BID) | INTRAMUSCULAR | Status: DC

## 2024-03-24 NOTE — H&P (Addendum)
 Cardiology Admission History and Physical   Patient ID: Eduardo [name] MRN: [id number]; DOB: 1956-01-08   Admission date: 03/24/2024  PCP:  Roosvelt Colla, MD   [facility] Providers Cardiologist:  Cody Das, MD  Electrophysiologist:  Manya Sells, MD       Chief Complaint:  Leg swelling  Patient Profile:   Eduardo [name] is a 68 y.o. 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  History of Present Illness:   [name] reports that since his surgery for small bowel resection in January of this year, he has noticed ongoing fatigue and feeling tired.  He has also noticed ongoing and worsening swelling of his left leg and some scrotal swelling.  During this period, he has gained some weight but nothing recently.  He denies shortness of breath with exertion, shortness of breath with lying flat, waking up due to shortness of breath, chest pain, or palpitations.  Due to concern for increase in water weight, he has gone up on prior torsemide  from 20 mg to 40 mg daily.  Two days prior, he started Bumex at 2 mg twice a day.  Due to no change in the swelling and feeling tired, he was informed to come in by his primary cardiologist for IV diuretic.    Of note, patient was recently seen by his primary cardiology Dr. Filiberto Hug three days prior.  Based on note documentation, provider was concerned for cardiogenic hemodynamic  congestion with noted lower extremity swelling, scrotal swelling, and ICD parameters concerning for high intra-cardiac pressures/volume.  He had his torsemide  increased from 20 mg as needed to 40 mg daily in hopes to improve hemodynamic congestion without much improvement prior.  It was decided that patient should have a scheduled admission for IV diuresis.     Past Medical History:  Diagnosis Date   CHF (congestive heart failure) (HCC)    Chronic kidney disease    stage 3   Colon polyp    Coronary artery disease    Diabetes mellitus 1987   under care of Dr. [name].  On insulin  since 96 (off and on)   Diabetic retinopathy    Dupuytren contracture    R hand, s/p injection (Dr. Jonna Netter)   Essential hypertension, benign    Essential hypertension, benign 02/06/2019   Frequency of urination and polyuria    Hypertension    Myocardial infarction [facility])    denies   Neuromuscular disorder (HCC)    Diabetic neuropathy   Osteomyelitis (HCC)    right foot   Other testicular hypofunction    Peripheral arterial disease (HCC) 10/28/2012   Peritoneal abscess (HCC) 6/08   and buttock.   Pneumonia    Polydipsia    Proteinuria    Pure hyperglyceridemia    Subacute osteomyelitis, right ankle and foot (HCC)    Wears glasses     Past Surgical History:  Procedure Laterality Date   ABDOMINAL AORTAGRAM N/A 04/18/2012   Procedure: ABDOMINAL Tommi Fraise;  Surgeon: Dannis Dy, MD;  Location: [facility]  CATH LAB;  Service: Cardiovascular;  Laterality: N/A;   AMPUTATION Right 05/19/2019   Procedure: RIGHT FOOT 5TH RAY AMPUTATION;  Surgeon: Timothy Ford, MD;  Location: [facility] OR;  Service: Orthopedics;  Laterality: Right;   AMPUTATION Right 03/11/2022   Procedure: RIGHT LEG DEBRIDEMENT VS. BELOW KNEE AMPUTATION;  Surgeon: Timothy Ford, MD;  Location: [facility] OR;  Service: Orthopedics;  Laterality: Right;   BIV ICD INSERTION CRT-D N/A 10/21/2022   Procedure: BIV ICD INSERTION CRT-D;  Surgeon: Tammie Fall,  MD;  Location: [facility] INVASIVE CV LAB;  Service: Cardiovascular;  Laterality: N/A;   BOWEL RESECTION N/A 11/14/2023   Procedure: SMALL BOWEL RESECTION;  Surgeon: Dorrie Gaudier, Alphonso Aschoff, MD;  Location: [facility] OR;  Service: General;  Laterality: N/A;   CARDIAC CATHETERIZATION N/A 09/08/2016   Procedure: Left Heart Cath and Coronary Angiography;  Surgeon: Knox Perl, MD;  Location: [facility] INVASIVE CV LAB;  Service: Cardiovascular;  Laterality: N/A;   CATARACT EXTRACTION, BILATERAL  09/2017, 10/2017   Dr. Lasandra Points   COLONOSCOPY W/ BIOPSIES AND POLYPECTOMY     CORONARY/GRAFT ACUTE MI REVASCULARIZATION N/A 10/11/2020   Procedure: Coronary/Graft Acute MI Revascularization;  Surgeon: Knox Perl, MD;  Location: [facility] INVASIVE CV LAB;  Service: Cardiovascular;  Laterality: N/A;   I & D EXTREMITY Right 03/11/2022   Procedure: BELOW KNEE AMPUTATION;  Surgeon: Timothy Ford, MD;  Location: [facility] OR;  Service: Orthopedics;  Laterality: Right;   INSERT / REPLACE / REMOVE PACEMAKER     LAPAROSCOPY N/A 11/14/2023   Procedure: LAPAROSCOPY DIAGNOSTIC;  Surgeon: Dorrie Gaudier, Alphonso Aschoff, MD;  Location: [facility] OR;  Service: General;  Laterality: N/A;  Converted to open ex-lap after inserting first trocar   LAPAROTOMY N/A 11/14/2023   Procedure: EXPLORATORY LAPAROTOMY;  Surgeon: Dorrie Gaudier Alphonso Aschoff, MD;  Location: [facility] OR;  Service: General;  Laterality: N/A;   LEFT HEART CATH N/A 12/31/2020   Procedure: Left Heart Cath;  Surgeon: Knox Perl, MD;  Location: [facility] INVASIVE CV LAB;  Service: Cardiovascular;  Laterality: N/A;   LEFT HEART CATH AND CORONARY ANGIOGRAPHY N/A 10/11/2020   Procedure: LEFT HEART CATH AND CORONARY ANGIOGRAPHY;  Surgeon: Knox Perl, MD;  Location: [facility] INVASIVE CV LAB;  Service: Cardiovascular;  Laterality: N/A;   LOWER EXTREMITY ANGIOGRAM Bilateral 04/18/2012   Procedure: LOWER EXTREMITY ANGIOGRAM;  Surgeon: Dannis Dy, MD;  Location: [facility] CATH LAB;  Service: Cardiovascular;  Laterality: Bilateral;  bilat lower  extrem angio   LOWER EXTREMITY ANGIOGRAPHY Bilateral 05/02/2019   Procedure: LOWER EXTREMITY ANGIOGRAPHY;  Surgeon: Knox Perl, MD;  Location: [facility] INVASIVE CV LAB;  Service: Cardiovascular;  Laterality: Bilateral;   LOWER EXTREMITY ANGIOGRAPHY Bilateral 02/28/2019   Procedure: LOWER EXTREMITY ANGIOGRAPHY;  Surgeon: Knox Perl, MD;  Location: [facility] INVASIVE CV LAB;  Service: Cardiovascular;  Laterality: Bilateral;   macular photocoagulation     (eye treatments for diabetic retinopathy)-Dr. Augustus Ledger   PERIPHERAL VASCULAR INTERVENTION  02/28/2019   Procedure: PERIPHERAL VASCULAR INTERVENTION;  Surgeon: Knox Perl, MD;  Location: [facility] INVASIVE CV LAB;  Service: Cardiovascular;;   TEE WITHOUT CARDIOVERSION N/A 03/18/2022   Procedure: TRANSESOPHAGEAL ECHOCARDIOGRAM (TEE);  Surgeon: Knox Perl, MD;  Location: [facility] ENDOSCOPY;  Service: Cardiovascular;  Laterality: N/A;   VENTRICULAR ASSIST DEVICE INSERTION N/A 12/31/2020   Procedure: VENTRICULAR ASSIST DEVICE INSERTION;  Surgeon: Knox Perl, MD;  Location: [facility] INVASIVE CV LAB;  Service: Cardiovascular;  Laterality: N/A;     Medications Prior to Admission: Prior to Admission medications   Medication Sig Start Date End Date Taking?  Authorizing Provider  acetaminophen  (TYLENOL ) 325 MG tablet Take 1-2 tablets (325-650 mg total) by mouth every 4 (four) hours as needed for mild pain. 03/31/22   Setzer, Sandra J, PA-C  Bioflavonoid Products (ESTER C PO) Take 1,000 mg by mouth daily.    [provider]  bumetanide (BUMEX) 2 MG tablet Take 1 tablet (2 mg total) by mouth 2 (two) times daily. 03/21/24   Patwardhan, Kaye Parsons, MD  Cholecalciferol  (VITAMIN D ) 2000 units CAPS Take 2,000 Units by mouth daily.    [provider]  clopidogrel  (PLAVIX ) 75 MG tablet Take 1 tablet (75 mg total) by mouth daily. 12/02/23   Patwardhan, Kaye Parsons, MD  ezetimibe  (ZETIA ) 10 MG tablet TAKE 1 TABLET BY MOUTH DAILY 07/08/23   Patwardhan, Manish J, MD  gemfibrozil  (LOPID ) 600  MG tablet Take 600 mg by mouth 2 (two) times daily before a meal.    [provider]  HUMALOG KWIKPEN 100 UNIT/ML KiwkPen Inject 3-10 Units into the skin 3 (three) times daily. Inject 01-1009 units into the skin three times a day, per sliding scale- based on BGL >100 08/02/16   [provider]  HUMULIN  N KWIKPEN 100 UNIT/ML KwikPen Inject 6 Units into the skin daily. 03/20/24   [provider]  Oksana Bergamo Oil 300 MG CAPS Take 500 mg by mouth daily.    [provider]  lidocaine  (XYLOCAINE ) 5 % ointment Apply 1 Application topically as needed. For pain as needed- can use up to 4x/day 01/03/24   Lovorn, Megan, MD  melatonin 10 MG TABS Take 10 mg by mouth at bedtime. 03/31/22   Setzer, Sandra J, PA-C  MOUNJARO 10 MG/0.5ML Pen Inject 10 mg into the skin once a week. 02/03/23   [provider]  Multiple Vitamin (MULTIVITAMIN WITH MINERALS) TABS tablet Take 1 tablet by mouth daily.    [provider]  nitroGLYCERIN  (NITROSTAT ) 0.4 MG SL tablet Place 1 tablet (0.4 mg total) under the tongue every 5 (five) minutes as needed for chest pain. 05/08/21   Cantwell, Celeste C, PA-C  Probiotic Product (PROBIOTIC-10 PO) Take 1 capsule by mouth daily.    [provider]  rosuvastatin  (CRESTOR ) 20 MG tablet Take 1 tablet (20 mg total) by mouth daily. 07/22/20   Knox Perl, MD  sacubitril -valsartan  (ENTRESTO ) 24-26 MG TAKE 1 TABLET BY MOUTH TWICE  DAILY 10/11/23   Patwardhan, Kaye Parsons, MD  sodium chloride  (OCEAN) 0.65 % SOLN nasal spray Place 1 spray into both nostrils as needed for congestion.    [provider]     Allergies:    Allergies  Allergen Reactions   Empagliflozin      Other Reaction(s): foot infection   Shellfish-Derived Products Nausea And Vomiting and Other (See Comments)    Only Mussels cause severe nausea and vomiting    Latex Rash   Tape Rash and Other (See Comments)    Caused issues with the skin   Testosterone  Rash    did not feel  well     Social History:   Social History   Socioeconomic History   Marital status: Widowed    Spouse name: Not on file   Number of children: 0   Years of education: Not on file   Highest education level: Not on file  Occupational History   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 Retired. 1 cat.  Girlfriend and her daughter lives with him. Enjoys fishing.   Updated 06/2021   Social Drivers of Health   Financial Resource Strain: Low Risk  (07/28/2023)   Overall Financial Resource Strain (CARDIA)    Difficulty of Paying Living Expenses: Not hard at all  Food Insecurity: No Food Insecurity (02/22/2024)   Hunger Vital Sign    Worried About Running Out of Food in the Last Year: Never true    Ran Out of Food in the Last Year: Never true  Transportation Needs: No Transportation Needs (02/22/2024)   PRAPARE - Administrator, Civil Service (Medical): No    Lack of Transportation (Non-Medical): No  Physical Activity: Sufficiently Active (07/28/2023)   Exercise Vital Sign    Days of Exercise per Week: 7 days    Minutes of Exercise per Session: 90 min  Stress: No Stress Concern Present (07/28/2023)   [employer] of Occupational Health - Occupational Stress Questionnaire    Feeling of Stress : Not at all  Social Connections: Socially Isolated (11/14/2023)   Social  Connection and Isolation Panel [NHANES]    Frequency of Communication with Friends and Family: Never    Frequency of Social Gatherings with Friends and Family: Never    Attends Religious Services: Never    Database administrator or Organizations: No    Attends Banker Meetings: Never    Marital Status: Married  Catering manager Violence: Not At Risk (02/22/2024)   Humiliation, Afraid, Rape, and Kick questionnaire    Fear of Current or Ex-Partner: No    Emotionally Abused: No    Physically Abused: No    Sexually Abused: No    Family History:   The patient's family history includes Dementia in his father; Diabetes in his maternal grandmother and mother; Hearing loss in his mother; Heart disease in his mother; Hyperlipidemia in his brother and mother; Hypertension in his mother; Varicose Veins in his father and mother.    ROS:  Please see the history of present illness.  All other ROS reviewed and negative.     Physical Exam/Data:   Vitals:   03/24/24 2000  BP: (!) 86/65  Pulse: 86  Resp: 18  Temp: 98.2 F (36.8 C)  TempSrc: Oral  SpO2: 99%  Weight: 101.8 kg  Height: 5\' 9"  (1.753 m)   No intake or output data in the 24 hours ending 03/24/24 2029    03/24/2024    8:00 PM 03/24/2024    8:25 AM 03/21/2024    1:09 PM  Last 3 Weights  Weight (lbs) 224 lb 6.9 oz 228 lb 6.4 oz 227 lb 9.6 oz  Weight (kg) 101.8 kg 103.602 kg 103.239 kg     Body mass index is 33.14 kg/m.  General:  Well nourished, well developed, in no acute distress HEENT: normal Neck:  JVD ~ 10 cm above  the right atrium Vascular: No carotid bruits; Distal pulses 2+ bilaterally   Cardiac:  normal S1, S2; RRR; no murmur  Lungs:  clear to auscultation bilaterally, no wheezing, rhonchi or rales  Abd: soft, nontender, no hepatomegaly  Ext: 2+ edema above left knee Musculoskeletal:  No deformities, BUE and BLE strength normal and equal Skin: warm and dry  Neuro:  CNs 2-12 intact, no focal  abnormalities noted Psych:  Normal affect    EKG:  The ECG is pending.     Relevant CV Studies: # Echocardiogram 09/22/23: IMPRESSIONS   1. Left ventricular ejection fraction, by estimation, is 30 to 35%. The  left ventricle has moderately decreased function. The left ventricle  demonstrates regional wall motion abnormalities (see scoring  diagram/findings for description). Left ventricular   diastolic function could not be evaluated.   2. Right ventricular systolic function is normal. The right ventricular  size is moderately enlarged. There is moderately elevated pulmonary artery  systolic pressure.   3. Left atrial size was mildly dilated.   4. The mitral valve is normal in structure. Moderate to severe mitral  valve regurgitation. No evidence of mitral stenosis.   5. The aortic valve is calcified. Aortic valve regurgitation is not  visualized. Aortic valve sclerosis/calcification is present, without any  evidence of aortic stenosis.   6. The inferior vena cava is normal in size with greater than 50%  respiratory variability, suggesting right atrial pressure of 3 mmHg.   # Coronary angiography 10/11/20: Left Heart Catheterization 10/11/20:  LV: Severely dilated.  Global hypokinesis.  Hand contrast injection hence not fully adequately visualized.  LVEF 15 to 20%.  EDP markedly elevated at 29 mmHg.  No pressure gradient across the aortic valve. Left main: Normal. LAD: Severely diffusely diseased.  Gives origin to large D1 which gives collaterals to the LAD and 2 smaller diagonals, LAD is occluded after the origin of D1, anatomy compared to prior in 2017 reveals progression of diffuse disease.  There are ipsilateral and contralateral collaterals to the LAD. CX: Moderate sized vessel, giving origin to large OM1.  OM1 is occluded in the ostium as was noted previously and has ipsilateral collaterals.  OM1 is diffusely diseased and faintly filled. RCA: Moderate disease in the proximal and  mid segment.  At the bifurcation of PDA and PL, there is a high-grade 90% stenosis.  The bifurcation is also involved with at least a 90% stenosis in the PDA and a 60 to 70% stenosis in the PL branch.  There is no target as distally as the PL branch which is large is occluded distally that was also noted in 2017.  There are faint collaterals noted from the left to the RCA.  Laboratory Data:  High Sensitivity Troponin:  No results for input(s): "TROPONINIHS" in the last 720 hours.    ChemistryNo results for input(s): "NA", "K", "CL", "CO2", "GLUCOSE", "BUN", "CREATININE", "CALCIUM ", "MG", "GFRNONAA", "GFRAA", "ANIONGAP" in the last 168 hours.  No results for input(s): "PROT", "ALBUMIN ", "AST", "ALT", "ALKPHOS", "BILITOT" in the last 168 hours. Lipids No results for input(s): "CHOL", "TRIG", "HDL", "LABVLDL", "LDLCALC", "CHOLHDL" in the last 168 hours. HematologyNo results for input(s): "WBC", "RBC", "HGB", "HCT", "MCV", "MCH", "MCHC", "RDW", "PLT" in the last 168 hours. Thyroid  No results for input(s): "TSH", "FREET4" in the last 168 hours. BNPNo results for input(s): "BNP", "PROBNP" in the last 168 hours.  DDimer No results for input(s): "DDIMER" in the last 168 hours.   Radiology/Studies:  No results found.  Assessment and Plan:   Hemodynamic congestion: Patient with hemodynamic congestion on examination secondary to subacute heart failure syndrome.  Despite subsequent increases to his home oral diuretics, unable to improve swelling in lower extremities.  Currently hemodynamically stable and without any signs of hypoperfusion. --Start IV furosemide  initially at 100 mg twice daily with a goal net negative 1-2 L and weight loss ~0.5 kg daily. --Daily standing weights, strict I/Os. --Get baseline CMP.   --BMP and Mg twice daily. --Replete potassium to >4 and Mg >2.  Cardiomyopathy: Secondary to ischemic cardiomyopathy with noted heart failure with reduced ejection fraction. Most recent  echocardiogram performed in 2024 demonstrated an EF of 30%-35%.   Now with hemodynamic congestion.  It appears patient has only been able to tolerate low-dose Entresto  at home given he is not taking spironolactone  or a beta-blocker.  Noted allergy to empagliflozin  in chart.   --Continue home Entresto . --Outpatient consideration for re-initiation of beta-blocker and or spironolactone .    Coronary artery disease: Multivessel coronary artery disease without revascularization.  Currently without angina.  Taking clopidogrel , rosuvastatin , and ezetimibe  at home. --Restart home medications.    Peripheral arterial disease: Extensive right lower extremity peripheral arterial.  Status post right below knee amputation.  On appropriate secondary prevention therapies. --Restart home therapies.  Type II diabetes: --Start inpatient insulin  protocol.    CRT-D in situ: --Nothing to do.      Risk Assessment/Risk Scores:       [place]  Heart Association (NYHA) Functional Class NYHA Class II    Code Status: Full Code  Severity of Illness: The appropriate patient status for this patient is INPATIENT. Inpatient status is judged to be reasonable and necessary in order to provide the required intensity of service to ensure the patient's safety. The patient's presenting symptoms, physical exam findings, and initial radiographic and laboratory data in the context of their chronic comorbidities is felt to place them at high risk for further clinical deterioration. Furthermore, it is not anticipated that the patient will be medically stable for discharge from the hospital within 2 midnights of admission.   * I certify that at the point of admission it is my clinical judgment that the patient will require inpatient hospital care spanning beyond 2 midnights from the point of admission due to high intensity of service, high risk for further deterioration and high frequency of surveillance required.*   For  questions or updates, please contact [facility] Please consult [email or web address] for contact info under     Signed, Antonieta Battle, MD  03/24/2024 8:29 PM

## 2024-03-24 NOTE — Telephone Encounter (Signed)
 Pt called in stating at recent appt Dr. Filiberto Hug wanted him to c/b on Friday to let them know he was ready to be admitted in to hospital this weekend for IV diuresis.He stated he was ready to get that initiated, please advise.

## 2024-03-24 NOTE — Progress Notes (Signed)
 [facility] OFFICE PROGRESS NOTE   Diagnosis: Cholangiocarcinoma  INTERVAL HISTORY:   Eduardo [name] returns as scheduled.  He underwent another liver biopsy on 03/16/2024.  Pathology showed poorly differentiated carcinoma with sclerotic stroma and necrosis, positive for cytokeratin AE1/AE3 and cytokeratin 7; negative for cytokeratin 20, synaptophysin, chromogranin, arginase, HepPar, TTF-1, GATA3, CDX2, PAX8 and NKX3.1.  He had right side abdominal pain for about a day after the biopsy procedure.  No pain at present.  He denies significant dyspnea.  He is uncomfortable from the "extra fluid".  He is planning hospital admission over the weekend for elective diuresis.  Objective:  Vital signs in last 24 hours:  Blood pressure (!) 88/62, pulse 87, temperature 97.7 F (36.5 C), temperature source Temporal, resp. rate 18, weight 228 lb 6.4 oz (103.6 kg), SpO2 100%.     Resp: Diminished breath sounds left lower lung field.  No respiratory distress. Cardio: Regular rate and rhythm. GI: Abdomen soft and nontender.  No hepatomegaly.  Abdominal wall edema. Vascular: 1+ edema left lower leg. Neuro: Alert and oriented.    Lab Results:  Lab Results  Component Value Date   WBC 6.1 03/16/2024   HGB 11.9 (L) 03/16/2024   HCT 38.1 (L) 03/16/2024   MCV 85.2 03/16/2024   PLT 208 03/16/2024   NEUTROABS 5.6 03/06/2024    Imaging:  No results found.  Medications: I have reviewed the patient's current medications.  Assessment/Plan: Liver mass CT abdomen/pelvis 11/14/2023-closed-loop small bowel obstruction, 8.8 cm hypoattenuating lesion in the posterior right liver with suggestion of nodular peripheral enhancement, small bilateral pleural effusions MRI abdomen 01/27/2024-rim-enhancing centrally hypoattenuating mass in the posterior right liver segment 7 measuring 11 x 8.4 cm, increased in size compared to the 11/14/2023 CT consistent with a necrotic metastasis or primary liver mass,  severe anasarca, small volume ascites, bilateral pleural effusions, mild splenomegaly CT abdomen/pelvis 02/14/2024-no change in complex right liver lesion, small bilateral pleural effusions left greater than right, soft tissue edema of the anterior abdominal wall Biopsy liver lesion 03/06/2024-benign hepatic parenchyma with reactive changes CT chest 03/09/2024-a few scattered small lung nodules measuring up to 4 mm right lower and left upper lobes.  Mild cardiomegaly.  Stable right and moderate left pleural effusions.  Anasarca.  Similar large hypodense mass filling the posterior right hepatic lobe. 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  We reviewed the pathology report from the recent liver biopsy.  He understands the finding of poorly differentiated carcinoma most consistent with cholangiocarcinoma.  Foundation 1 and FGFR testing have been requested.  We discussed the possibility of surgical resection.  He has several comorbid conditions including ischemic cardiomyopathy.  His case will be presented at the upcoming GI tumor conference.  He will return for follow-up in 3 to 4 weeks, sooner if needed.  Patient seen with Dr. Scherrie Curt.    Diana Forster ANP/GNP-BC   03/24/2024  9:20 AM  This was a shared visit with Diana Forster.  Eduardo [name] was interviewed and examined.  We reviewed the pathology findings with  him.  The biopsy reveals carcinoma consistent  with cholangiocarcinoma.  We discussed treatment options.  He will be referred for a surgical evaluation.  He may not be a surgical candidate secondary to the tumor size/location for his underlying heart disease.  He plans to contact cardiology for management of volume overload.  We will consider systemic therapy and hepatic directed therapy options if he is not a surgical candidate.  His case will be presented at the GI tumor conference.  I was present for greater than 50% of today's visit.  I performed Medical Decision Making.  Anise Kerns, MD

## 2024-03-24 NOTE — Telephone Encounter (Signed)
Please review mychart message.

## 2024-03-24 NOTE — Telephone Encounter (Signed)
 Left message to call back

## 2024-03-25 ENCOUNTER — Other Ambulatory Visit: Payer: Self-pay

## 2024-03-25 ENCOUNTER — Encounter (HOSPITAL_COMMUNITY): Payer: Self-pay | Admitting: Internal Medicine

## 2024-03-25 ENCOUNTER — Inpatient Hospital Stay (HOSPITAL_COMMUNITY)

## 2024-03-25 DIAGNOSIS — I255 Ischemic cardiomyopathy: Secondary | ICD-10-CM

## 2024-03-25 DIAGNOSIS — I5043 Acute on chronic combined systolic (congestive) and diastolic (congestive) heart failure: Secondary | ICD-10-CM | POA: Diagnosis not present

## 2024-03-25 DIAGNOSIS — C221 Intrahepatic bile duct carcinoma: Secondary | ICD-10-CM

## 2024-03-25 DIAGNOSIS — I2510 Atherosclerotic heart disease of native coronary artery without angina pectoris: Secondary | ICD-10-CM

## 2024-03-25 DIAGNOSIS — Z95810 Presence of automatic (implantable) cardiac defibrillator: Secondary | ICD-10-CM

## 2024-03-25 LAB — BASIC METABOLIC PANEL WITH GFR
Anion gap: 11 (ref 5–15)
BUN: 47 mg/dL — ABNORMAL HIGH (ref 8–23)
CO2: 21 mmol/L — ABNORMAL LOW (ref 22–32)
Calcium: 8.5 mg/dL — ABNORMAL LOW (ref 8.9–10.3)
Chloride: 103 mmol/L (ref 98–111)
Creatinine, Ser: 1.54 mg/dL — ABNORMAL HIGH (ref 0.61–1.24)
GFR, Estimated: 49 mL/min — ABNORMAL LOW (ref >60)
Glucose, Bld: 95 mg/dL (ref 70–99)
Potassium: 3.4 mmol/L — ABNORMAL LOW (ref 3.5–5.1)
Sodium: 135 mmol/L (ref 135–145)

## 2024-03-25 LAB — GLUCOSE, CAPILLARY
Glucose-Capillary: 93 mg/dL (ref 70–99)
Glucose-Capillary: 143 mg/dL — ABNORMAL HIGH (ref 70–99)
Glucose-Capillary: 176 mg/dL — ABNORMAL HIGH (ref 70–99)
Glucose-Capillary: 180 mg/dL — ABNORMAL HIGH (ref 70–99)

## 2024-03-25 LAB — FERRITIN: Ferritin: 35 ng/mL (ref 24–336)

## 2024-03-25 LAB — COOXEMETRY PANEL
Carboxyhemoglobin: 1 % (ref 0.5–1.5)
Methemoglobin: <0.7 % (ref 0.0–1.5)
O2 Saturation: 68.5 %
Total hemoglobin: 10.9 g/dL — ABNORMAL LOW (ref 12.0–16.0)

## 2024-03-25 LAB — IRON AND TIBC
Iron: 37 ug/dL — ABNORMAL LOW (ref 45–182)
Saturation Ratios: 11 % — ABNORMAL LOW (ref 17.9–39.5)
TIBC: 336 ug/dL (ref 250–450)
UIBC: 299 ug/dL

## 2024-03-25 LAB — LACTIC ACID, PLASMA: Lactic Acid, Venous: 0.9 mmol/L (ref 0.5–1.9)

## 2024-03-25 MED ORDER — SODIUM CHLORIDE 0.9% FLUSH: 10.0000 mL | INTRAVENOUS | Status: DC | PRN

## 2024-03-25 MED ORDER — FUROSEMIDE 10 MG/ML IJ SOLN: 20.0000 mg | Freq: Four times a day (QID) | INTRAMUSCULAR | Status: DC

## 2024-03-25 MED ORDER — SODIUM CHLORIDE 0.9% FLUSH
10.0000 mL | Freq: Two times a day (BID) | INTRAVENOUS | Status: DC
  Administered 2024-03-25 (×2): 10 mL
  Administered 2024-03-25: 20 mL
  Administered 2024-03-26 – 2024-04-02 (×15): 10 mL
  Administered 2024-04-02: 40 mL
  Administered 2024-04-03 – 2024-04-04 (×3): 10 mL

## 2024-03-25 MED ORDER — MIDODRINE HCL 5 MG PO TABS
5.0000 mg | ORAL_TABLET | Freq: Three times a day (TID) | ORAL | Status: DC
  Administered 2024-03-25 – 2024-03-27 (×6): 5 mg via ORAL
  Filled 2024-03-25 (×6): qty 1

## 2024-03-25 MED ORDER — POTASSIUM CHLORIDE 10 MEQ/100ML IV SOLN
10.0000 meq | INTRAVENOUS | Status: AC
  Administered 2024-03-25 (×2): 10 meq via INTRAVENOUS
  Filled 2024-03-25 (×2): qty 100

## 2024-03-25 MED ORDER — FUROSEMIDE 10 MG/ML IJ SOLN
20.0000 mg/h | INTRAVENOUS | Status: DC
  Administered 2024-03-25 – 2024-03-27 (×4): 10 mg/h via INTRAVENOUS
  Administered 2024-03-28: 20 mg/h via INTRAVENOUS
  Administered 2024-03-28: 10 mg/h via INTRAVENOUS
  Administered 2024-03-29 – 2024-03-30 (×4): 20 mg/h via INTRAVENOUS
  Filled 2024-03-25 (×12): qty 20

## 2024-03-25 MED ORDER — CHLORHEXIDINE GLUCONATE CLOTH 2 % EX PADS
6.0000 | MEDICATED_PAD | Freq: Every day | CUTANEOUS | Status: DC
  Administered 2024-03-25 – 2024-04-04 (×11): 6 via TOPICAL

## 2024-03-25 NOTE — Plan of Care (Signed)
  Problem: Education: Goal: Knowledge of General Education information will improve Description: Including pain rating scale, medication(s)/side effects and non-pharmacologic comfort measures Outcome: Progressing   Problem: Health Behavior/Discharge Planning: Goal: Ability to manage health-related needs will improve Outcome: Progressing   Problem: Clinical Measurements: Goal: Ability to maintain clinical measurements within normal limits will improve Outcome: Progressing Goal: Will remain free from infection Outcome: Progressing Goal: Diagnostic test results will improve Outcome: Progressing Goal: Respiratory complications will improve Outcome: Progressing Goal: Cardiovascular complication will be avoided Outcome: Progressing   Problem: Activity: Goal: Risk for activity intolerance will decrease Outcome: Progressing   Problem: Nutrition: Goal: Adequate nutrition will be maintained Outcome: Progressing   Problem: Coping: Goal: Level of anxiety will decrease Outcome: Progressing   Problem: Elimination: Goal: Will not experience complications related to bowel motility Outcome: Progressing Goal: Will not experience complications related to urinary retention Outcome: Progressing   Problem: Pain Managment: Goal: General experience of comfort will improve and/or be controlled Outcome: Progressing   Problem: Skin Integrity: Goal: Risk for impaired skin integrity will decrease Outcome: Progressing   Problem: Education: Goal: Ability to describe self-care measures that may prevent or decrease complications (Diabetes Survival Skills Education) will improve Outcome: Progressing Goal: Individualized Educational Video(s) Outcome: Progressing   Problem: Coping: Goal: Ability to adjust to condition or change in health will improve Outcome: Progressing   Problem: Fluid Volume: Goal: Ability to maintain a balanced intake and output will improve Outcome: Progressing    Problem: Health Behavior/Discharge Planning: Goal: Ability to identify and utilize available resources and services will improve Outcome: Progressing Goal: Ability to manage health-related needs will improve Outcome: Progressing   Problem: Metabolic: Goal: Ability to maintain appropriate glucose levels will improve Outcome: Progressing   Problem: Nutritional: Goal: Maintenance of adequate nutrition will improve Outcome: Progressing Goal: Progress toward achieving an optimal weight will improve Outcome: Progressing   Problem: Skin Integrity: Goal: Risk for impaired skin integrity will decrease Outcome: Progressing   Problem: Tissue Perfusion: Goal: Adequacy of tissue perfusion will improve Outcome: Progressing   Problem: Education: Goal: Ability to describe self-care measures that may prevent or decrease complications (Diabetes Survival Skills Education) will improve Outcome: Progressing Goal: Individualized Educational Video(s) Outcome: Progressing   Problem: Coping: Goal: Ability to adjust to condition or change in health will improve Outcome: Progressing   Problem: Fluid Volume: Goal: Ability to maintain a balanced intake and output will improve Outcome: Progressing   Problem: Health Behavior/Discharge Planning: Goal: Ability to identify and utilize available resources and services will improve Outcome: Progressing Goal: Ability to manage health-related needs will improve Outcome: Progressing   Problem: Metabolic: Goal: Ability to maintain appropriate glucose levels will improve Outcome: Progressing   Problem: Nutritional: Goal: Maintenance of adequate nutrition will improve Outcome: Progressing Goal: Progress toward achieving an optimal weight will improve Outcome: Progressing   Problem: Skin Integrity: Goal: Risk for impaired skin integrity will decrease Outcome: Progressing   Problem: Activity: Goal: Capacity to carry out activities will  improve Outcome: Progressing   Problem: Cardiac: Goal: Ability to achieve and maintain adequate cardiopulmonary perfusion will improve Outcome: Progressing

## 2024-03-25 NOTE — Progress Notes (Addendum)
 Progress Note  Patient Name: Eduardo [name] Date of Encounter: 03/25/2024  Primary Cardiologist: Cody Das, MD  Subjective   No symptoms, no SOB or angina, but not active either.  Inpatient Medications    Scheduled Meds:  cholecalciferol   2,000 Units Oral Daily   clopidogrel   75 mg Oral Daily   ezetimibe   10 mg Oral Daily   furosemide   80 mg Intravenous BID   gemfibrozil   600 mg Oral BID AC   insulin  aspart  0-20 Units Subcutaneous TID WC   insulin  aspart  0-5 Units Subcutaneous QHS   melatonin  10 mg Oral QHS   multivitamin with minerals  1 tablet Oral Daily   rosuvastatin   20 mg Oral Daily   sacubitril -valsartan   1 tablet Oral BID   sodium chloride  flush  3 mL Intravenous Q12H   Continuous Infusions:  sodium chloride      PRN Meds: sodium chloride , acetaminophen , ondansetron  (ZOFRAN ) IV, sodium chloride  flush   Vital Signs    Vitals:   03/25/24 0355 03/25/24 0358 03/25/24 0725 03/25/24 0754  BP:  95/67 (!) 78/54 (!) 78/50  Pulse:  81 78   Resp:   20   Temp: 97.8 F (36.6 C)  97.8 F (36.6 C)   TempSrc: Oral  Oral   SpO2:  90% 97%   Weight: 100.3 kg     Height:        Intake/Output Summary (Last 24 hours) at 03/25/2024 0914 Last data filed at 03/25/2024 0500 Gross per 24 hour  Intake 200 ml  Output 500 ml  Net -300 ml   Filed Weights   03/24/24 2000 03/25/24 0355  Weight: 101.8 kg 100.3 kg    Telemetry     Personally reviewed.  NSR and few runs of NSVT.  ECG    None performed today.  Physical Exam   GEN: No acute distress.   Neck: Sitting upright Cardiac: RRR, no murmur, rub, or gallop.  Respiratory: Nonlabored. Clear to auscultation bilaterally. GI: Soft, nontender, bowel sounds present. MS: 2-3+ pitting edema in LLE and RLW BKA Neuro:  Nonfocal. Psych: Alert and oriented x 3. Normal affect.  Labs    Chemistry Recent Labs  Lab 03/24/24 2234 03/25/24 0222  NA 136 135  K 3.2* 3.4*  CL 102 103  CO2 22 21*  GLUCOSE 115*  95  BUN 49* 47*  CREATININE 1.59* 1.54*  CALCIUM  8.4* 8.5*  PROT 5.2*  --   ALBUMIN  2.9*  --   AST 21  --   ALT 12  --   ALKPHOS 61  --   BILITOT 0.8  --   GFRNONAA 47* 49*  ANIONGAP 12 11     Hematology Recent Labs  Lab 03/24/24 2234  WBC 5.6  RBC 4.06*  HGB 10.9*  HCT 33.7*  MCV 83.0  MCH 26.8  MCHC 32.3  RDW 19.4*  PLT 180    Cardiac EnzymesNo results for input(s): "TROPONINIHS" in the last 720 hours.  BNP Recent Labs  Lab 03/24/24 2239  BNP 4,192.8*     DDimerNo results for input(s): "DDIMER" in the last 168 hours.   Radiology    US  EKG SITE RITE Result Date: 03/25/2024 If Site Rite image not attached, placement could not be confirmed due to current cardiac rhythm.   Assessment & Plan    Acute on chronic systolic heart failure: Sent from cardiology clinic for elective IV diuresis for 3 days.  BiV ICD interrogation revealed moderate volume overload  and CT chest imaging also revealed small pericardial effusion, small to moderate pleural effusions, anasarca with no evidence of pulmonary vascular congestion.  Initially was on torsemide  20 mg as needed, switched to torsemide  20 mg twice daily with inadequate urine output at home and hence admitted to the hospital for elective IV diuresis.  Does not report any DOE but not much active either.  Chest x-ray today showed pulmonary vascular congestion and left pleural effusion. Might need thoracentesis if he does not respond to IV diuresis. He did not receive IV Lasix  last night, scheduled to receive IV Lasix  80 mg twice daily today. BP low, 70s SBP.  Will recheck BP prior to administering IV Lasix .  Will place PICC line for coox. Prior echo from November 2024 showed LVEF 30 to 35%, moderate to severe MR.  If Cox is low, he will need to be transferred to the unit for inotropes and IV diuresis.  Repeat echocardiogram.  Obtain lactic acid.  Discussed case with Dr. Bensimhon.  Ischemic cardiomyopathy s/p BiV ICD: LVEF mildly  improved after BiV ICD placement, prior echocardiogram from November 2024 showed LVEF 30 to 35%, moderate-severe MR.  Repeat echocardiogram.  Will hold off Entresto  until BP stabilized.  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  No angina.  Follows with Dr. Filiberto Hug.  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  Follows with oncology.    Addendum @ 1:30 PM Lactic acid is normal. CVP is 10 mm Hg, CXR pulmonary vascular congestion and CoOx is 68. AHF recommended lasix  drip @ 10mg /hr. Will start midodrine  due to soft Bps. Signed, Lasalle Pointer, MD  03/25/2024, 9:14 AM

## 2024-03-25 NOTE — Progress Notes (Signed)
 BP 78/50 manual this morning. 80mg  lasix  due now. Callie Goodrich, PA paged - advised to hold lasix  dose until pt is seen by MD this morning.

## 2024-03-25 NOTE — Progress Notes (Signed)
 Peripherally Inserted Central Catheter Placement  The IV Nurse has discussed with the patient and/or persons authorized to consent for the patient, the purpose of this procedure and the potential benefits and risks involved with this procedure.  The benefits include less needle sticks, lab draws from the catheter, and the patient may be discharged home with the catheter. Risks include, but not limited to, infection, bleeding, blood clot (thrombus formation), and puncture of an artery; nerve damage and irregular heartbeat and possibility to perform a PICC exchange if needed/ordered by physician.  Alternatives to this procedure were also discussed.  Bard Power PICC patient education guide, fact sheet on infection prevention and patient information card has been provided to patient /or left at bedside.    PICC Placement Documentation  PICC Triple Lumen 03/25/24 Right Basilic 40 cm 0 cm (Active)  Indication for Insertion or Continuance of Line Vasoactive infusions;Chronic illness with exacerbations (CF, Sickle Cell, etc.) 03/25/24 1131  Exposed Catheter (cm) 0 cm 03/25/24 1131  Site Assessment Clean, Dry, Intact 03/25/24 1131  Lumen #1 Status Flushed;Saline locked;Blood return noted 03/25/24 1131  Lumen #2 Status Flushed;Saline locked;Blood return noted 03/25/24 1131  Lumen #3 Status Flushed;Saline locked;Blood return noted 03/25/24 1131  Dressing Type Transparent;Securing device 03/25/24 1131  Dressing Status Antimicrobial disc/dressing in place;Clean, Dry, Intact 03/25/24 1131  Line Care Connections checked and tightened 03/25/24 1131  Line Adjustment (NICU/IV Team Only) No 03/25/24 1131  Dressing Intervention New dressing;Adhesive placed at insertion site (IV team only);Adhesive placed around edges of dressing (IV team/ICU RN only) 03/25/24 1131  Dressing Change Due 04/01/24 03/25/24 1131       Allegra Arch 03/25/2024, 11:32 AM

## 2024-03-25 NOTE — Plan of Care (Signed)
  Problem: Clinical Measurements: Goal: Ability to maintain clinical measurements within normal limits will improve Outcome: Progressing Goal: Will remain free from infection Outcome: Progressing Goal: Diagnostic test results will improve Outcome: Progressing Goal: Cardiovascular complication will be avoided Outcome: Progressing   

## 2024-03-26 ENCOUNTER — Inpatient Hospital Stay (HOSPITAL_COMMUNITY)

## 2024-03-26 DIAGNOSIS — I5023 Acute on chronic systolic (congestive) heart failure: Secondary | ICD-10-CM

## 2024-03-26 DIAGNOSIS — C221 Intrahepatic bile duct carcinoma: Secondary | ICD-10-CM | POA: Diagnosis not present

## 2024-03-26 DIAGNOSIS — I2510 Atherosclerotic heart disease of native coronary artery without angina pectoris: Secondary | ICD-10-CM | POA: Diagnosis not present

## 2024-03-26 DIAGNOSIS — I255 Ischemic cardiomyopathy: Secondary | ICD-10-CM | POA: Diagnosis not present

## 2024-03-26 DIAGNOSIS — I5043 Acute on chronic combined systolic (congestive) and diastolic (congestive) heart failure: Secondary | ICD-10-CM | POA: Diagnosis not present

## 2024-03-26 DIAGNOSIS — I4729 Other ventricular tachycardia: Secondary | ICD-10-CM

## 2024-03-26 LAB — BASIC METABOLIC PANEL WITH GFR
Anion gap: 10 (ref 5–15)
BUN: 43 mg/dL — ABNORMAL HIGH (ref 8–23)
CO2: 22 mmol/L (ref 22–32)
Calcium: 8.4 mg/dL — ABNORMAL LOW (ref 8.9–10.3)
Chloride: 105 mmol/L (ref 98–111)
Creatinine, Ser: 1.49 mg/dL — ABNORMAL HIGH (ref 0.61–1.24)
GFR, Estimated: 51 mL/min — ABNORMAL LOW (ref >60)
Glucose, Bld: 160 mg/dL — ABNORMAL HIGH (ref 70–99)
Potassium: 3.2 mmol/L — ABNORMAL LOW (ref 3.5–5.1)
Sodium: 137 mmol/L (ref 135–145)

## 2024-03-26 LAB — COOXEMETRY PANEL
Carboxyhemoglobin: 0.7 % (ref 0.5–1.5)
Methemoglobin: <0.7 % (ref 0.0–1.5)
O2 Saturation: 61.9 %
Total hemoglobin: 11.3 g/dL — ABNORMAL LOW (ref 12.0–16.0)

## 2024-03-26 LAB — ECHOCARDIOGRAM COMPLETE
Height: 69 in
S' Lateral: 5.8 cm
Weight: 3516.78 [oz_av]

## 2024-03-26 LAB — HEPATIC FUNCTION PANEL
ALT: 8 U/L (ref 0–44)
AST: 24 U/L (ref 15–41)
Albumin: 2.8 g/dL — ABNORMAL LOW (ref 3.5–5.0)
Alkaline Phosphatase: 64 U/L (ref 38–126)
Bilirubin, Direct: 0.2 mg/dL (ref 0.0–0.2)
Indirect Bilirubin: 0.7 mg/dL (ref 0.3–0.9)
Total Bilirubin: 0.9 mg/dL (ref 0.0–1.2)
Total Protein: 5.7 g/dL — ABNORMAL LOW (ref 6.5–8.1)

## 2024-03-26 LAB — GLUCOSE, CAPILLARY
Glucose-Capillary: 151 mg/dL — ABNORMAL HIGH (ref 70–99)
Glucose-Capillary: 184 mg/dL — ABNORMAL HIGH (ref 70–99)
Glucose-Capillary: 194 mg/dL — ABNORMAL HIGH (ref 70–99)
Glucose-Capillary: 151 mg/dL — ABNORMAL HIGH (ref 70–99)

## 2024-03-26 MED ORDER — PERFLUTREN LIPID MICROSPHERE
1.0000 mL | INTRAVENOUS | Status: AC | PRN
  Administered 2024-03-26: 3 mL via INTRAVENOUS

## 2024-03-26 MED ORDER — AMIODARONE HCL 200 MG PO TABS
400.0000 mg | ORAL_TABLET | Freq: Two times a day (BID) | ORAL | Status: DC
  Administered 2024-03-26 – 2024-04-01 (×12): 400 mg via ORAL
  Filled 2024-03-26 (×12): qty 2

## 2024-03-26 MED ORDER — AMIODARONE HCL 200 MG PO TABS
200.0000 mg | ORAL_TABLET | Freq: Two times a day (BID) | ORAL | Status: DC
  Administered 2024-03-26: 200 mg via ORAL
  Filled 2024-03-26: qty 1

## 2024-03-26 NOTE — Plan of Care (Signed)
  Problem: Education: Goal: Knowledge of General Education information will improve Description: Including pain rating scale, medication(s)/side effects and non-pharmacologic comfort measures Outcome: Progressing   Problem: Health Behavior/Discharge Planning: Goal: Ability to manage health-related needs will improve Outcome: Progressing   Problem: Clinical Measurements: Goal: Ability to maintain clinical measurements within normal limits will improve Outcome: Progressing Goal: Will remain free from infection Outcome: Progressing Goal: Diagnostic test results will improve Outcome: Progressing Goal: Respiratory complications will improve Outcome: Progressing Goal: Cardiovascular complication will be avoided Outcome: Progressing   Problem: Activity: Goal: Risk for activity intolerance will decrease Outcome: Progressing   Problem: Nutrition: Goal: Adequate nutrition will be maintained Outcome: Progressing   Problem: Coping: Goal: Level of anxiety will decrease Outcome: Progressing   Problem: Elimination: Goal: Will not experience complications related to bowel motility Outcome: Progressing Goal: Will not experience complications related to urinary retention Outcome: Progressing   Problem: Pain Managment: Goal: General experience of comfort will improve and/or be controlled Outcome: Progressing   Problem: Safety: Goal: Ability to remain free from injury will improve Outcome: Progressing   Problem: Skin Integrity: Goal: Risk for impaired skin integrity will decrease Outcome: Progressing   Problem: Education: Goal: Ability to describe self-care measures that may prevent or decrease complications (Diabetes Survival Skills Education) will improve Outcome: Progressing Goal: Individualized Educational Video(s) Outcome: Progressing   Problem: Coping: Goal: Ability to adjust to condition or change in health will improve Outcome: Progressing   Problem: Fluid  Volume: Goal: Ability to maintain a balanced intake and output will improve Outcome: Progressing   Problem: Health Behavior/Discharge Planning: Goal: Ability to identify and utilize available resources and services will improve Outcome: Progressing Goal: Ability to manage health-related needs will improve Outcome: Progressing   Problem: Metabolic: Goal: Ability to maintain appropriate glucose levels will improve Outcome: Progressing   Problem: Metabolic: Goal: Ability to maintain appropriate glucose levels will improve Outcome: Progressing   Problem: Nutritional: Goal: Maintenance of adequate nutrition will improve Outcome: Progressing Goal: Progress toward achieving an optimal weight will improve Outcome: Progressing   Problem: Education: Goal: Ability to describe self-care measures that may prevent or decrease complications (Diabetes Survival Skills Education) will improve Outcome: Progressing Goal: Individualized Educational Video(s) Outcome: Progressing   Problem: Skin Integrity: Goal: Risk for impaired skin integrity will decrease Outcome: Progressing   Problem: Tissue Perfusion: Goal: Adequacy of tissue perfusion will improve Outcome: Progressing   Problem: Health Behavior/Discharge Planning: Goal: Ability to identify and utilize available resources and services will improve Outcome: Progressing Goal: Ability to manage health-related needs will improve Outcome: Progressing   Problem: Nutritional: Goal: Maintenance of adequate nutrition will improve Outcome: Progressing Goal: Progress toward achieving an optimal weight will improve Outcome: Progressing

## 2024-03-26 NOTE — Plan of Care (Signed)
   Problem: Clinical Measurements: Goal: Ability to maintain clinical measurements within normal limits will improve Outcome: Progressing Goal: Will remain free from infection Outcome: Progressing Goal: Diagnostic test results will improve Outcome: Progressing Goal: Respiratory complications will improve Outcome: Progressing

## 2024-03-26 NOTE — Progress Notes (Addendum)
 Progress Note  Patient Name: Eduardo [name] Date of Encounter: 03/26/2024  Primary Cardiologist: Cody Das, MD  Subjective   No symptoms, no SOB or angina, but not active either.  Inpatient Medications    Scheduled Meds:  Chlorhexidine  Gluconate Cloth  6 each Topical Daily   cholecalciferol   2,000 Units Oral Daily   clopidogrel   75 mg Oral Daily   ezetimibe   10 mg Oral Daily   gemfibrozil   600 mg Oral BID AC   insulin  aspart  0-20 Units Subcutaneous TID WC   insulin  aspart  0-5 Units Subcutaneous QHS   melatonin  10 mg Oral QHS   midodrine   5 mg Oral TID WC   multivitamin with minerals  1 tablet Oral Daily   rosuvastatin   20 mg Oral Daily   sodium chloride  flush  10-40 mL Intracatheter Q12H   sodium chloride  flush  3 mL Intravenous Q12H   Continuous Infusions:  furosemide  (LASIX ) 200 mg in dextrose  5 % 100 mL (2 mg/mL) infusion 10 mg/hr (03/26/24 0842)   PRN Meds: acetaminophen , ondansetron  (ZOFRAN ) IV, sodium chloride  flush, sodium chloride  flush   Vital Signs    Vitals:   03/25/24 1940 03/26/24 0106 03/26/24 0355 03/26/24 0727  BP: (!) 84/59 95/62 (!) 89/59 (!) 84/54  Pulse: 88 88 83 78  Resp: 18 17 18 18   Temp: 98.1 F (36.7 C) 98.3 F (36.8 C) 97.8 F (36.6 C) 97.7 F (36.5 C)  TempSrc: Oral Oral Oral Oral  SpO2: 96% 98% 96% 96%  Weight:   99.7 kg   Height:        Intake/Output Summary (Last 24 hours) at 03/26/2024 0950 Last data filed at 03/26/2024 0852 Gross per 24 hour  Intake 480.69 ml  Output 1500 ml  Net -1019.31 ml   Filed Weights   03/24/24 2000 03/25/24 0355 03/26/24 0355  Weight: 101.8 kg 100.3 kg 99.7 kg    Telemetry     Personally reviewed.  NSR and few runs of NSVT.  ECG    None performed today.  Physical Exam   GEN: No acute distress.   Neck: Sitting upright Cardiac: RRR, no murmur, rub, or gallop.  Respiratory: Nonlabored. Clear to auscultation bilaterally. GI: Soft, nontender, bowel sounds present. MS: 2-3+  pitting edema in LLE and RLW BKA Neuro:  Nonfocal. Psych: Alert and oriented x 3. Normal affect.  Labs    Chemistry Recent Labs  Lab 03/24/24 2234 03/25/24 0222 03/26/24 0529  NA 136 135 137  K 3.2* 3.4* 3.2*  CL 102 103 105  CO2 22 21* 22  GLUCOSE 115* 95 160*  BUN 49* 47* 43*  CREATININE 1.59* 1.54* 1.49*  CALCIUM  8.4* 8.5* 8.4*  PROT 5.2*  --   --   ALBUMIN  2.9*  --   --   AST 21  --   --   ALT 12  --   --   ALKPHOS 61  --   --   BILITOT 0.8  --   --   GFRNONAA 47* 49* 51*  ANIONGAP 12 11 10      Hematology Recent Labs  Lab 03/24/24 2234  WBC 5.6  RBC 4.06*  HGB 10.9*  HCT 33.7*  MCV 83.0  MCH 26.8  MCHC 32.3  RDW 19.4*  PLT 180    Cardiac EnzymesNo results for input(s): "TROPONINIHS" in the last 720 hours.  BNP Recent Labs  Lab 03/24/24 2239  BNP 4,192.8*     DDimerNo results for input(s): "  DDIMER" in the last 168 hours.   Radiology    DG CHEST PORT 1 VIEW Result Date: 03/25/2024 CLINICAL DATA:  CHF. EXAM: PORTABLE CHEST 1 VIEW COMPARISON:  Chest CT 03/09/2024 FINDINGS: Left-sided pacemaker in place. The heart is enlarged. Moderate left and small right pleural effusion. Left greater than right basilar opacity favors compressive atelectasis. Vascular congestion without frank pulmonary edema. No pneumothorax. IMPRESSION: 1. Cardiomegaly with vascular congestion. 2. Moderate left and small right pleural effusions. Left greater than right basilar opacity favoring compressive atelectasis. Electronically Signed   By: Chadwick Colonel M.D.   On: 03/25/2024 12:06   US  EKG SITE RITE Result Date: 03/25/2024 If Site Rite image not attached, placement could not be confirmed due to current cardiac rhythm.   Assessment & Plan    Acute on chronic systolic heart failure: Sent from cardiology clinic for elective IV diuresis for 3 days.  BiV ICD interrogation revealed moderate volume overload Initially was on torsemide  20 mg as needed, switched to torsemide  20 mg  twice daily with inadequate urine output at home and hence admitted to the hospital for elective IV diuresis. CT chest imaging also revealed small pericardial effusion, small to moderate pleural effusions, anasarca with no evidence of pulmonary vascular congestion.  Chest x-ray showed left pleural effusion and pulmonary vascular congestion. Does not report any DOE but not much active either.  PICC line placed yesterday, CVP 10 mmHg, lactic acid normal and co-ox normal. Started midodrine  and Lasix  drip at 10 mg/h (Lasix  drip was started yesterday in the late afternoon) with 1.2L urine output and net -0.7 L.  Doing well.  Prior echo from November 2024 showed LVEF 30 to 35%, moderate to severe MR.  Echocardiogram this admission is pending.  Ischemic cardiomyopathy s/p BiV ICD: LVEF mildly improved after BiV ICD placement, prior echocardiogram from November 2024 showed LVEF 30 to 35%, moderate-severe MR. Repeat echocardiogram.  Hold Entresto  until BP is more than 110 mmHg.  Multiple runs of NSVT: Unable to add BB due to soft BP requiring midodrine .  Will start amiodarone  200 mg twice daily with no IV drip due to concerns for hypotension.  Will add hepatic panel today.  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  No angina.  Follows with Dr. Filiberto Hug.  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  Follows with oncology.   Signed, Lasalle Pointer, MD  03/26/2024, 9:50 AM

## 2024-03-27 ENCOUNTER — Telehealth: Payer: Self-pay | Admitting: Oncology

## 2024-03-27 ENCOUNTER — Encounter (HOSPITAL_COMMUNITY): Payer: Self-pay

## 2024-03-27 DIAGNOSIS — I255 Ischemic cardiomyopathy: Secondary | ICD-10-CM | POA: Diagnosis not present

## 2024-03-27 DIAGNOSIS — I5043 Acute on chronic combined systolic (congestive) and diastolic (congestive) heart failure: Secondary | ICD-10-CM | POA: Diagnosis not present

## 2024-03-27 DIAGNOSIS — I950 Idiopathic hypotension: Secondary | ICD-10-CM | POA: Diagnosis not present

## 2024-03-27 DIAGNOSIS — I2510 Atherosclerotic heart disease of native coronary artery without angina pectoris: Secondary | ICD-10-CM | POA: Diagnosis not present

## 2024-03-27 LAB — GLUCOSE, CAPILLARY
Glucose-Capillary: 130 mg/dL — ABNORMAL HIGH (ref 70–99)
Glucose-Capillary: 162 mg/dL — ABNORMAL HIGH (ref 70–99)
Glucose-Capillary: 164 mg/dL — ABNORMAL HIGH (ref 70–99)
Glucose-Capillary: 167 mg/dL — ABNORMAL HIGH (ref 70–99)

## 2024-03-27 LAB — COOXEMETRY PANEL
Carboxyhemoglobin: 0.8 % (ref 0.5–1.5)
Methemoglobin: <0.7 % (ref 0.0–1.5)
O2 Saturation: 64.8 %
Total hemoglobin: 11.2 g/dL — ABNORMAL LOW (ref 12.0–16.0)

## 2024-03-27 LAB — BASIC METABOLIC PANEL WITH GFR
Anion gap: 10 (ref 5–15)
BUN: 41 mg/dL — ABNORMAL HIGH (ref 8–23)
CO2: 24 mmol/L (ref 22–32)
Calcium: 8.4 mg/dL — ABNORMAL LOW (ref 8.9–10.3)
Chloride: 102 mmol/L (ref 98–111)
Creatinine, Ser: 1.63 mg/dL — ABNORMAL HIGH (ref 0.61–1.24)
GFR, Estimated: 46 mL/min — ABNORMAL LOW (ref >60)
Glucose, Bld: 151 mg/dL — ABNORMAL HIGH (ref 70–99)
Potassium: 3.3 mmol/L — ABNORMAL LOW (ref 3.5–5.1)
Sodium: 136 mmol/L (ref 135–145)

## 2024-03-27 MED ORDER — POTASSIUM CHLORIDE CRYS ER 20 MEQ PO TBCR
40.0000 meq | EXTENDED_RELEASE_TABLET | ORAL | Status: AC
  Administered 2024-03-27 (×2): 40 meq via ORAL
  Filled 2024-03-27 (×2): qty 2

## 2024-03-27 MED ORDER — POLYETHYLENE GLYCOL 3350 17 G PO PACK
17.0000 g | PACK | Freq: Every day | ORAL | Status: DC
  Administered 2024-03-27 – 2024-04-03 (×7): 17 g via ORAL
  Filled 2024-03-27 (×9): qty 1

## 2024-03-27 MED ORDER — INSULIN NPH (HUMAN) (ISOPHANE) 100 UNIT/ML ~~LOC~~ SUSP
6.0000 [IU] | Freq: Every day | SUBCUTANEOUS | Status: DC
  Administered 2024-03-28: 6 [IU] via SUBCUTANEOUS
  Filled 2024-03-27: qty 10

## 2024-03-27 MED ORDER — MIDODRINE HCL 5 MG PO TABS
10.0000 mg | ORAL_TABLET | Freq: Three times a day (TID) | ORAL | Status: DC
  Administered 2024-03-27 – 2024-03-28 (×3): 10 mg via ORAL
  Filled 2024-03-27 (×3): qty 2

## 2024-03-27 NOTE — Telephone Encounter (Signed)
 Patient has been scheduled for follow-up visit per 03/27/24 LOS.  Pt aware of scheduled appt details.

## 2024-03-27 NOTE — Progress Notes (Addendum)
 Patient refuses call bell and bed alarms for toileting and ambulation. Educated and verbally understands.

## 2024-03-27 NOTE — Progress Notes (Signed)
 Rounding Note    Patient Name: Eduardo [name] Date of Encounter: 03/27/2024  [facility] Cardiologist: Cody Das, MD   Subjective   Edema is going down. Breathing stable. No significant chest pain  Inpatient Medications    Scheduled Meds:  amiodarone   400 mg Oral BID   Chlorhexidine  Gluconate Cloth  6 each Topical Daily   cholecalciferol   2,000 Units Oral Daily   clopidogrel   75 mg Oral Daily   ezetimibe   10 mg Oral Daily   gemfibrozil   600 mg Oral BID AC   insulin  aspart  0-20 Units Subcutaneous TID WC   insulin  aspart  0-5 Units Subcutaneous QHS   melatonin  10 mg Oral QHS   midodrine   5 mg Oral TID WC   multivitamin with minerals  1 tablet Oral Daily   rosuvastatin   20 mg Oral Daily   sodium chloride  flush  10-40 mL Intracatheter Q12H   sodium chloride  flush  3 mL Intravenous Q12H   Continuous Infusions:  furosemide  (LASIX ) 200 mg in dextrose  5 % 100 mL (2 mg/mL) infusion 10 mg/hr (03/27/24 0449)   PRN Meds: acetaminophen , ondansetron  (ZOFRAN ) IV, sodium chloride  flush, sodium chloride  flush   Vital Signs    Vitals:   03/27/24 0036 03/27/24 0451 03/27/24 0454 03/27/24 0729  BP: (!) 89/57 (!) 82/59 (!) 89/53 (!) 81/60  Pulse: 86   85  Resp: 18  18 19   Temp: 97.9 F (36.6 C)  97.8 F (36.6 C) 98 F (36.7 C)  TempSrc: Oral  Oral Oral  SpO2: 99%  99% 99%  Weight:      Height:        Intake/Output Summary (Last 24 hours) at 03/27/2024 1031 Last data filed at 03/27/2024 0850 Gross per 24 hour  Intake 910.91 ml  Output 2600 ml  Net -1689.09 ml      03/26/2024    3:55 AM 03/25/2024    3:55 AM 03/24/2024    8:00 PM  Last 3 Weights  Weight (lbs) 219 lb 12.8 oz 221 lb 1.9 oz 224 lb 6.9 oz  Weight (kg) 99.7 kg 100.3 kg 101.8 kg      Telemetry    Paced rhythm, 1 episode of NSVT - Personally Reviewed  ECG    Atrial sensed ventricular paced rhythm - Personally Reviewed  Physical Exam   GEN: No acute distress.   Neck: No  JVD Cardiac: RRR, no murmurs, rubs, or gallops.  Respiratory: Clear to auscultation bilaterally. GI: Soft, nontender, non-distended  MS: 1+ edema; R BKA. Neuro:  Nonfocal  Psych: Normal affect   Labs    High Sensitivity Troponin:  No results for input(s): "TROPONINIHS" in the last 720 hours.   Chemistry Recent Labs  Lab 03/24/24 2234 03/25/24 0222 03/26/24 0529 03/26/24 1301 03/27/24 0603  NA 136 135 137  --  136  K 3.2* 3.4* 3.2*  --  3.3*  CL 102 103 105  --  102  CO2 22 21* 22  --  24  GLUCOSE 115* 95 160*  --  151*  BUN 49* 47* 43*  --  41*  CREATININE 1.59* 1.54* 1.49*  --  1.63*  CALCIUM  8.4* 8.5* 8.4*  --  8.4*  PROT 5.2*  --   --  5.7*  --   ALBUMIN  2.9*  --   --  2.8*  --   AST 21  --   --  24  --   ALT 12  --   --  8  --   ALKPHOS 61  --   --  64  --   BILITOT 0.8  --   --  0.9  --   GFRNONAA 47* 49* 51*  --  46*  ANIONGAP 12 11 10   --  10    Lipids No results for input(s): "CHOL", "TRIG", "HDL", "LABVLDL", "LDLCALC", "CHOLHDL" in the last 168 hours.  Hematology Recent Labs  Lab 03/24/24 2234  WBC 5.6  RBC 4.06*  HGB 10.9*  HCT 33.7*  MCV 83.0  MCH 26.8  MCHC 32.3  RDW 19.4*  PLT 180   Thyroid  No results for input(s): "TSH", "FREET4" in the last 168 hours.  BNP Recent Labs  Lab 03/24/24 2239  BNP 4,192.8*    DDimer No results for input(s): "DDIMER" in the last 168 hours.   Radiology    ECHOCARDIOGRAM COMPLETE Result Date: 03/26/2024    ECHOCARDIOGRAM REPORT   Patient Name:   JAIREN GOLDFARB Date of Exam: 03/26/2024 Medical Rec #:  [id number]    Height:       69.0 in Accession #:    [id number]   Weight:       219.8 lb Date of Birth:  July 14, 1956   BSA:          2.151 m Patient Age:    68 years     BP:           89/66 mmHg Patient Gender: M            HR:           85 bpm. Exam Location:  Inpatient Procedure: 2D Echo, Color Doppler, Cardiac Doppler and Intracardiac            Opacification Agent (Both Spectral and Color Flow Doppler were             utilized during procedure). Indications:    CHF  History:        Patient has prior history of Echocardiogram examinations, most                 recent 09/22/2023. Risk Factors:Diabetes and Hypertension.  Sonographer:    Janette Medley Referring Phys: [id number] VISHNU P MALLIPEDDI IMPRESSIONS  1. No LV thrombus by Definity . Left ventricular ejection fraction, by estimation, is 20 to 25%. The left ventricle has severely decreased function. The left ventricle demonstrates regional wall motion abnormalities (see scoring diagram/findings for description). The left ventricular internal cavity size was severely dilated. There is moderate asymmetric left ventricular hypertrophy of the septal segment. Left ventricular diastolic function could not be evaluated.  2. Right ventricular systolic function is severely reduced. The right ventricular size is mildly enlarged. Tricuspid regurgitation signal is inadequate for assessing PA pressure.  3. Left atrial size was mildly dilated.  4. Large pleural effusion in the left lateral region.  5. The mitral valve is normal in structure. Mild mitral valve regurgitation. No evidence of mitral stenosis.  6. The aortic valve is tricuspid. There is moderate calcification of the aortic valve. Aortic valve regurgitation is not visualized. No aortic stenosis is present.  7. The inferior vena cava is dilated in size with <50% respiratory variability, suggesting right atrial pressure of 15 mmHg. Comparison(s): Changes from prior study are noted. LVEF has worsened from 30-35% to 20-25%, RV function is severely reduced and there is a large left pleural effusion. FINDINGS  Left Ventricle: No LV thrombus by Definity . Left ventricular ejection fraction, by estimation, is 20 to 25%. The  left ventricle has severely decreased function. The left ventricle demonstrates regional wall motion abnormalities. Strain was performed and  the global longitudinal strain is indeterminate. The left ventricular internal  cavity size was severely dilated. There is moderate asymmetric left ventricular hypertrophy of the septal segment. Left ventricular diastolic function could not be evaluated due to nondiagnostic images. Left ventricular diastolic function could not be evaluated.  LV Wall Scoring: The entire anterior wall, mid and distal lateral wall, entire anterior septum, entire apex, entire inferior wall, posterior wall, mid anterolateral segment, and mid inferoseptal segment are akinetic. The basal anterolateral segment and basal inferoseptal segment are hypokinetic. Right Ventricle: The right ventricular size is mildly enlarged. No increase in right ventricular wall thickness. Right ventricular systolic function is severely reduced. Tricuspid regurgitation signal is inadequate for assessing PA pressure. Left Atrium: Left atrial size was mildly dilated. Right Atrium: Right atrial size was normal in size. Pericardium: There is no evidence of pericardial effusion. Mitral Valve: The mitral valve is normal in structure. Mild mitral valve regurgitation. No evidence of mitral valve stenosis. Tricuspid Valve: The tricuspid valve is normal in structure. Tricuspid valve regurgitation is not demonstrated. No evidence of tricuspid stenosis. Aortic Valve: The aortic valve is tricuspid. There is moderate calcification of the aortic valve. Aortic valve regurgitation is not visualized. No aortic stenosis is present. Pulmonic Valve: The pulmonic valve was grossly normal. Pulmonic valve regurgitation is trivial. No evidence of pulmonic stenosis. Aorta: The aortic root is normal in size and structure. Venous: The inferior vena cava is dilated in size with less than 50% respiratory variability, suggesting right atrial pressure of 15 mmHg. IAS/Shunts: No atrial level shunt detected by color flow Doppler. Additional Comments: 3D was performed not requiring image post processing on an independent workstation and was indeterminate. A device lead is  visualized. There is a large pleural effusion in the left lateral region.  LEFT VENTRICLE PLAX 2D LVIDd:         6.20 cm   Diastology LVIDs:         5.80 cm   LV e' medial:  2.36 cm/s LV PW:         1.10 cm   LV e' lateral: 7.65 cm/s LV IVS:        1.60 cm LVOT diam:     2.20 cm LV SV:         40 LV SV Index:   19 LVOT Area:     3.80 cm  RIGHT VENTRICLE             IVC RV S prime:     12.60 cm/s  IVC diam: 2.20 cm TAPSE (M-mode): 2.0 cm LEFT ATRIUM             Index        RIGHT ATRIUM           Index LA diam:        4.90 cm 2.28 cm/m   RA Area:     16.10 cm LA Vol (A2C):   80.0 ml 37.20 ml/m  RA Volume:   42.10 ml  19.58 ml/m LA Vol (A4C):   76.5 ml 35.57 ml/m LA Biplane Vol: 81.3 ml 37.80 ml/m  AORTIC VALVE LVOT Vmax:   67.60 cm/s LVOT Vmean:  43.000 cm/s LVOT VTI:    0.105 m  AORTA Ao Asc diam: 3.20 cm  SHUNTS Systemic VTI:  0.10 m Systemic Diam: 2.20 cm Vishnu Priya Mallipeddi Electronically signed by Lucetta Russel Mallipeddi  Signature Date/Time: 03/26/2024/1:12:20 PM    Final     Cardiac Studies   Echo 03/26/2024  1. No LV thrombus by Definity . Left ventricular ejection fraction, by  estimation, is 20 to 25%. The left ventricle has severely decreased  function. The left ventricle demonstrates regional wall motion  abnormalities (see scoring diagram/findings for  description). The left ventricular internal cavity size was severely  dilated. There is moderate asymmetric left ventricular hypertrophy of the  septal segment. Left ventricular diastolic function could not be  evaluated.   2. Right ventricular systolic function is severely reduced. The right  ventricular size is mildly enlarged. Tricuspid regurgitation signal is  inadequate for assessing PA pressure.   3. Left atrial size was mildly dilated.   4. Large pleural effusion in the left lateral region.   5. The mitral valve is normal in structure. Mild mitral valve  regurgitation. No evidence of mitral stenosis.   6. The aortic valve  is tricuspid. There is moderate calcification of the  aortic valve. Aortic valve regurgitation is not visualized. No aortic  stenosis is present.   7. The inferior vena cava is dilated in size with <50% respiratory  variability, suggesting right atrial pressure of 15 mmHg.   Comparison(s): Changes from prior study are noted. LVEF has worsened from  30-35% to 20-25%, RV function is severely reduced and there is a large  left pleural effusion.    Patient Profile     68 y.o. 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   - Echo 03/26/2024 EF 20-25%, unable to assess PA pressure, large left pleural effusion, mild MR.   - PICC line placed. Started on IV lasix  gtt and midodrine . Home entresto  held  - SBP 80s overnight, coox 64, increase midodrine  to 10mg  TID.  - continue with IV diuresis. Goal is to optimize his CHF condition prior to oncology treatment. He is aware he is likely high risk patient if a intraabdominal surgery is needed.   Ischemic cardiomyopathy s/p CRT-D  Multiple runs of NSVR: unable to add BB due to soft blood pressure requiring midodrine . Started on 400mg  BID amiodarone , decrease to 200mg  daily after 1 week.   Multivessel CAD: no angina. Cath on 09/08/2016 and 10/11/2020 revealing severe triple-vessel CAD with no targets for revascularization   Cholangiocarcinoma: diagnosed recently via liver biopsy. Follows by oncology service      For questions or updates, please contact [facility] Please consult [email or web address] for contact info under        Signed, Ervin Heath, PA  03/27/2024, 10:31 AM

## 2024-03-27 NOTE — Plan of Care (Signed)
  Problem: Clinical Measurements: Goal: Will remain free from infection Outcome: Progressing Goal: Diagnostic test results will improve Outcome: Progressing   Problem: Activity: Goal: Risk for activity intolerance will decrease Outcome: Progressing   Problem: Nutrition: Goal: Adequate nutrition will be maintained Outcome: Progressing

## 2024-03-27 NOTE — Care Management Important Message (Signed)
 Important Message  Patient Details  Name: JOHNLUKE HAUGEN MRN: [id number] Date of Birth: 11/20/1955   Important Message Given:  Yes - Medicare IM     Wynonia Hedges 03/27/2024, 4:04 PM

## 2024-03-28 DIAGNOSIS — I959 Hypotension, unspecified: Secondary | ICD-10-CM | POA: Diagnosis not present

## 2024-03-28 DIAGNOSIS — E871 Hypo-osmolality and hyponatremia: Secondary | ICD-10-CM

## 2024-03-28 DIAGNOSIS — R739 Hyperglycemia, unspecified: Secondary | ICD-10-CM | POA: Diagnosis not present

## 2024-03-28 DIAGNOSIS — N1831 Chronic kidney disease, stage 3a: Secondary | ICD-10-CM | POA: Diagnosis not present

## 2024-03-28 DIAGNOSIS — I5043 Acute on chronic combined systolic (congestive) and diastolic (congestive) heart failure: Secondary | ICD-10-CM | POA: Diagnosis not present

## 2024-03-28 DIAGNOSIS — N1832 Chronic kidney disease, stage 3b: Secondary | ICD-10-CM

## 2024-03-28 DIAGNOSIS — C220 Liver cell carcinoma: Secondary | ICD-10-CM

## 2024-03-28 LAB — BASIC METABOLIC PANEL WITH GFR
Anion gap: 9 (ref 5–15)
BUN: 44 mg/dL — ABNORMAL HIGH (ref 8–23)
CO2: 24 mmol/L (ref 22–32)
Calcium: 8.8 mg/dL — ABNORMAL LOW (ref 8.9–10.3)
Chloride: 103 mmol/L (ref 98–111)
Creatinine, Ser: 1.73 mg/dL — ABNORMAL HIGH (ref 0.61–1.24)
GFR, Estimated: 42 mL/min — ABNORMAL LOW (ref >60)
Glucose, Bld: 156 mg/dL — ABNORMAL HIGH (ref 70–99)
Potassium: 4.3 mmol/L (ref 3.5–5.1)
Sodium: 136 mmol/L (ref 135–145)

## 2024-03-28 LAB — COOXEMETRY PANEL
Carboxyhemoglobin: 0.7 % (ref 0.5–1.5)
Carboxyhemoglobin: 0.5 % (ref 0.5–1.5)
Methemoglobin: <0.7 % (ref 0.0–1.5)
Methemoglobin: <0.7 % (ref 0.0–1.5)
O2 Saturation: 51 %
O2 Saturation: 60.4 %
Total hemoglobin: 12.2 g/dL (ref 12.0–16.0)
Total hemoglobin: 10.8 g/dL — ABNORMAL LOW (ref 12.0–16.0)

## 2024-03-28 LAB — GLUCOSE, CAPILLARY
Glucose-Capillary: 144 mg/dL — ABNORMAL HIGH (ref 70–99)
Glucose-Capillary: 188 mg/dL — ABNORMAL HIGH (ref 70–99)
Glucose-Capillary: 223 mg/dL — ABNORMAL HIGH (ref 70–99)
Glucose-Capillary: 148 mg/dL — ABNORMAL HIGH (ref 70–99)
Glucose-Capillary: 147 mg/dL — ABNORMAL HIGH (ref 70–99)

## 2024-03-28 MED ORDER — INSULIN NPH (HUMAN) (ISOPHANE) 100 UNIT/ML ~~LOC~~ SUSP
6.0000 [IU] | Freq: Every day | SUBCUTANEOUS | Status: DC
  Administered 2024-03-29 – 2024-03-31 (×3): 6 [IU] via SUBCUTANEOUS
  Filled 2024-03-28: qty 0.06

## 2024-03-28 MED ORDER — GUAIFENESIN ER 600 MG PO TB12
600.0000 mg | ORAL_TABLET | Freq: Two times a day (BID) | ORAL | Status: DC
Start: 2024-03-28 — End: 2024-04-04
  Administered 2024-03-28 – 2024-04-03 (×13): 600 mg via ORAL
  Filled 2024-03-28 (×14): qty 1

## 2024-03-28 MED ORDER — NOREPINEPHRINE 4 MG/250ML-% IV SOLN
8.0000 ug/min | INTRAVENOUS | Status: DC
  Administered 2024-03-28: 2 ug/min via INTRAVENOUS
  Administered 2024-03-29: 5 ug/min via INTRAVENOUS
  Filled 2024-03-28 (×2): qty 250

## 2024-03-28 MED ORDER — INSULIN GLARGINE-YFGN 100 UNIT/ML ~~LOC~~ SOLN
4.0000 [IU] | Freq: Two times a day (BID) | SUBCUTANEOUS | Status: DC
  Administered 2024-03-28: 4 [IU] via SUBCUTANEOUS
  Filled 2024-03-28 (×2): qty 0.04

## 2024-03-28 NOTE — H&P (View-Only) (Signed)
 Advanced Heart Failure Team Consult Note   Primary Physician: Roosvelt Colla, MD Cardiologist:  Cody Das, MD Reason for Consultation: A/C CHF w ?CGS HPI:    Eduardo [name] is seen today for evaluation of a/c biventricular systolic heart failure with concern for cardiogenic shock at the request of Dr. Nolan Battle.   Eduardo [name] is 68 y.o. with history HFrEF d/t ICM s/p CRT-D, severe 3V CAD, STEMI, R SFA occlusion s/p PTA with 3 overlapping DES, T2DM, PAD s/p R BKA, CKD 3b, HTN, and HLD.   Previously seen in [facility] 11/2020 and decided against LVAD. Follows with Dr. Filiberto Hug and Dr. Arlester Ladd.   Of note, recently underwent small bowel resection and anastomosis for small bowel obstruction where liver mass was noted. He underwent biopsy which showed poorly differentiated carcinoma, most consistent with cholangiocarcinoma.   He was seen 5/15 by Dr. Filiberto Hug with progressive LE edema. CRT-D showed moderate volume overload. His Torsemide  40 mg bid was changed to Bumex  2 mg bid and he was advised to come to the hospital for IV diuresis and CHF optimization in the setting of discussions of surgical resection of liver mass. His case is being discussed at GI tumor conference.   Presenting labs notable for K 3.2, BUN/Cr 49/1.59, BNP 4192, hgb 10.9. CXR showed pulm vascular congestion, He was started on IV Lasix , without urine response. PICC line placed. Coox has remained in 60s until this morning when it dropped to 51. His course has been complicated by hypotension and was started on midodrine , however hypotension has continued. He was transferred to the ICU for further BP management.   Remains with minimal UOP, weight is relatively unchanged from admission. CVP 14. SBP remains in low 80s. No dizziness or shortness of breath. Feels fatigued and swollen.   Home Medications Prior to Admission medications   Medication Sig Start Date End Date Taking? Authorizing Provider  acetaminophen  (TYLENOL ) 325  MG tablet Take 1-2 tablets (325-650 mg total) by mouth every 4 (four) hours as needed for mild pain. 03/31/22  Yes Setzer, Sandra J, PA-C  Bioflavonoid Products (ESTER C PO) Take 1,000 mg by mouth daily.   Yes [provider]  bumetanide  (BUMEX ) 2 MG tablet Take 1 tablet (2 mg total) by mouth 2 (two) times daily. 03/21/24  Yes Patwardhan, Manish J, MD  Cholecalciferol  (VITAMIN D ) 2000 units CAPS Take 2,000 Units by mouth daily.   Yes [provider]  clopidogrel  (PLAVIX ) 75 MG tablet Take 1 tablet (75 mg total) by mouth daily. 12/02/23  Yes Patwardhan, Kaye Parsons, MD  Continuous Glucose Sensor (FREESTYLE LIBRE 2 PLUS SENSOR) MISC 1 Application. 03/20/24  Yes [provider]  ezetimibe  (ZETIA ) 10 MG tablet TAKE 1 TABLET BY MOUTH DAILY 07/08/23  Yes Patwardhan, Manish J, MD  gemfibrozil  (LOPID ) 600 MG tablet Take 600 mg by mouth 2 (two) times daily before a meal.   Yes [provider]  HUMALOG KWIKPEN 100 UNIT/ML KiwkPen Inject 3-10 Units into the skin 3 (three) times daily. Inject 01-1009 units into the skin three times a day, per sliding scale- based on BGL >100 08/02/16  Yes [provider]  HUMULIN  N KWIKPEN 100 UNIT/ML KwikPen Inject 6 Units into the skin daily. 03/20/24  Yes [provider]  Oksana Bergamo Oil 300 MG CAPS Take 500 mg by mouth daily.   Yes [provider]  lidocaine  (XYLOCAINE ) 5 % ointment Apply 1 Application topically as needed. For pain as needed- can use  up to 4x/day 01/03/24  Yes Lovorn, Megan, MD  melatonin 10 MG TABS Take 10 mg by mouth at bedtime. 03/31/22  Yes Setzer, Sandra J, PA-C  Multiple Vitamin (MULTIVITAMIN WITH MINERALS) TABS tablet Take 1 tablet by mouth daily.   Yes [provider]  nitroGLYCERIN  (NITROSTAT ) 0.4 MG SL tablet Place 1 tablet (0.4 mg total) under the tongue every 5 (five) minutes as needed for chest pain. 05/08/21  Yes Cantwell, Celeste C, PA-C  Probiotic Product (PROBIOTIC-10 PO) Take 1 capsule by  mouth daily.   Yes [provider]  rosuvastatin  (CRESTOR ) 20 MG tablet Take 1 tablet (20 mg total) by mouth daily. 07/22/20  Yes Knox Perl, MD  sacubitril -valsartan  (ENTRESTO ) 24-26 MG TAKE 1 TABLET BY MOUTH TWICE  DAILY 10/11/23  Yes Patwardhan, Manish J, MD  sodium chloride  (OCEAN) 0.65 % SOLN nasal spray Place 1 spray into both nostrils as needed for congestion.   Yes [provider]  tirzepatide Florence Hunt) 12.5 MG/0.5ML Pen Inject 12.5 mg into the skin once a week.   Yes [provider]    Past Medical History: Past Medical History:  Diagnosis Date   CHF (congestive heart failure) (HCC)    Chronic kidney disease    stage 3   Colon polyp    Coronary artery disease    Diabetes mellitus 1987   under care of Dr. [name].  On insulin  since 96 (off and on)   Diabetic retinopathy    Dupuytren contracture    R hand, s/p injection (Dr. Jonna Netter)   Essential hypertension, benign    Essential hypertension, benign 02/06/2019   Frequency of urination and polyuria    Hypertension    Myocardial infarction [facility])    denies   Neuromuscular disorder (HCC)    Diabetic neuropathy   Osteomyelitis (HCC)    right foot   Other testicular hypofunction    Peripheral arterial disease (HCC) 10/28/2012   Peritoneal abscess (HCC) 6/08   and buttock.   Pneumonia    Polydipsia    Proteinuria    Pure hyperglyceridemia    Subacute osteomyelitis, right ankle and foot (HCC)    Wears glasses     Past Surgical History: Past Surgical History:  Procedure Laterality Date   ABDOMINAL AORTAGRAM N/A 04/18/2012   Procedure: ABDOMINAL Tommi Fraise;  Surgeon: Dannis Dy, MD;  Location: [facility] CATH LAB;  Service: Cardiovascular;  Laterality: N/A;   AMPUTATION Right 05/19/2019   Procedure: RIGHT FOOT 5TH RAY AMPUTATION;  Surgeon: Timothy Ford, MD;  Location: [facility] OR;  Service: Orthopedics;  Laterality: Right;   AMPUTATION Right 03/11/2022   Procedure: RIGHT LEG DEBRIDEMENT VS. BELOW KNEE  AMPUTATION;  Surgeon: Timothy Ford, MD;  Location: [facility] OR;  Service: Orthopedics;  Laterality: Right;   BIV ICD INSERTION CRT-D N/A 10/21/2022   Procedure: BIV ICD INSERTION CRT-D;  Surgeon: Tammie Fall, MD;  Location: [facility] INVASIVE CV LAB;  Service: Cardiovascular;  Laterality: N/A;   BOWEL RESECTION N/A 11/14/2023   Procedure: SMALL BOWEL RESECTION;  Surgeon: Dorrie Gaudier, Alphonso Aschoff, MD;  Location: [facility] OR;  Service: General;  Laterality: N/A;   CARDIAC CATHETERIZATION N/A 09/08/2016   Procedure: Left Heart Cath and Coronary Angiography;  Surgeon: Knox Perl, MD;  Location: [facility] INVASIVE CV LAB;  Service: Cardiovascular;  Laterality: N/A;   CATARACT EXTRACTION, BILATERAL  09/2017, 10/2017   Dr. Lasandra Points   COLONOSCOPY W/ BIOPSIES AND POLYPECTOMY     CORONARY/GRAFT ACUTE MI REVASCULARIZATION N/A 10/11/2020   Procedure: Coronary/Graft Acute  MI Revascularization;  Surgeon: Knox Perl, MD;  Location: [facility] INVASIVE CV LAB;  Service: Cardiovascular;  Laterality: N/A;   I & D EXTREMITY Right 03/11/2022   Procedure: BELOW KNEE AMPUTATION;  Surgeon: Timothy Ford, MD;  Location: [facility] OR;  Service: Orthopedics;  Laterality: Right;   INSERT / REPLACE / REMOVE PACEMAKER     LAPAROSCOPY N/A 11/14/2023   Procedure: LAPAROSCOPY DIAGNOSTIC;  Surgeon: Dorrie Gaudier, Alphonso Aschoff, MD;  Location: [facility] OR;  Service: General;  Laterality: N/A;  Converted to open ex-lap after inserting first trocar   LAPAROTOMY N/A 11/14/2023   Procedure: EXPLORATORY LAPAROTOMY;  Surgeon: Dorrie Gaudier Alphonso Aschoff, MD;  Location: [facility] OR;  Service: General;  Laterality: N/A;   LEFT HEART CATH N/A 12/31/2020   Procedure: Left Heart Cath;  Surgeon: Knox Perl, MD;  Location: [facility] INVASIVE CV LAB;  Service: Cardiovascular;  Laterality: N/A;   LEFT HEART CATH AND CORONARY ANGIOGRAPHY N/A 10/11/2020   Procedure: LEFT HEART CATH AND CORONARY ANGIOGRAPHY;  Surgeon: Knox Perl, MD;  Location: [facility] INVASIVE CV LAB;  Service: Cardiovascular;  Laterality: N/A;   LOWER  EXTREMITY ANGIOGRAM Bilateral 04/18/2012   Procedure: LOWER EXTREMITY ANGIOGRAM;  Surgeon: Dannis Dy, MD;  Location: [facility] CATH LAB;  Service: Cardiovascular;  Laterality: Bilateral;  bilat lower extrem angio   LOWER EXTREMITY ANGIOGRAPHY Bilateral 05/02/2019   Procedure: LOWER EXTREMITY ANGIOGRAPHY;  Surgeon: Knox Perl, MD;  Location: [facility] INVASIVE CV LAB;  Service: Cardiovascular;  Laterality: Bilateral;   LOWER EXTREMITY ANGIOGRAPHY Bilateral 02/28/2019   Procedure: LOWER EXTREMITY ANGIOGRAPHY;  Surgeon: Knox Perl, MD;  Location: [facility] INVASIVE CV LAB;  Service: Cardiovascular;  Laterality: Bilateral;   macular photocoagulation     (eye treatments for diabetic retinopathy)-Dr. Augustus Ledger   PERIPHERAL VASCULAR INTERVENTION  02/28/2019   Procedure: PERIPHERAL VASCULAR INTERVENTION;  Surgeon: Knox Perl, MD;  Location: [facility] INVASIVE CV LAB;  Service: Cardiovascular;;   TEE WITHOUT CARDIOVERSION N/A 03/18/2022   Procedure: TRANSESOPHAGEAL ECHOCARDIOGRAM (TEE);  Surgeon: Knox Perl, MD;  Location: [facility] ENDOSCOPY;  Service: Cardiovascular;  Laterality: N/A;   VENTRICULAR ASSIST DEVICE INSERTION N/A 12/31/2020   Procedure: VENTRICULAR ASSIST DEVICE INSERTION;  Surgeon: Knox Perl, MD;  Location: [facility] INVASIVE CV LAB;  Service: Cardiovascular;  Laterality: N/A;    Family History: Family History  Problem Relation Age of Onset   Diabetes Mother    Hearing loss Mother    Hypertension Mother    Hyperlipidemia Mother    Heart disease Mother    Varicose Veins Mother    Varicose Veins Father    Dementia Father    Hyperlipidemia Brother    Diabetes Maternal Grandmother     Social History: Social History   Socioeconomic History   Marital status: Widowed    Spouse name: Not on file   Number of children: 0   Years of education: Not on file   Highest education level: Not on file  Occupational History   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 Retired. 1 cat.  Girlfriend and her daughter lives with him. Enjoys fishing.   Updated 06/2021   Social Drivers of Health   Financial Resource Strain: Low Risk  (07/28/2023)   Overall Financial Resource Strain (CARDIA)    Difficulty of Paying Living Expenses: Not hard at all  Food Insecurity: No Food Insecurity (03/24/2024)   Hunger Vital Sign    Worried About Running Out of Food in the Last Year: Never true    Ran Out of Food in the Last Year: Never true  Transportation Needs: No Transportation Needs (03/24/2024)   PRAPARE - Administrator, Civil Service (Medical): No    Lack of Transportation (Non-Medical): No  Physical Activity: Sufficiently Active (07/28/2023)   Exercise Vital Sign    Days of Exercise per Week: 7 days    Minutes of Exercise per Session: 90 min  Stress: No Stress Concern Present (07/28/2023)   [employer] of Occupational Health - Occupational Stress Questionnaire    Feeling of Stress : Not at all  Social Connections: Socially Isolated (03/24/2024)   Social Connection and Isolation Panel [NHANES]    Frequency of Communication with Friends and Family: Never    Frequency of Social Gatherings with Friends and Family: Never    Attends Religious Services: Never    Database administrator or Organizations: No    Attends Banker Meetings: Never    Marital Status: Living with partner     Allergies:  Allergies  Allergen Reactions   Empagliflozin      Other Reaction(s): foot infection   Shellfish-Derived Products Nausea And Vomiting and Other (See Comments)    Only Mussels cause severe nausea and vomiting    Latex Rash   Tape Rash and Other (See Comments)    Caused issues with the skin   Testosterone  Rash    did not feel well     Objective:    Vital Signs:   Temp:  [97.6 F (36.4 C)-98 F (36.7 C)] 98 F (36.7 C) (05/20 0740) Pulse Rate:  [65-88] 77 (05/20 0740) Resp:  [17-20] 17 (05/20 0740) BP: (84-95)/(54-64) 88/54 (05/20 0740) SpO2:  [97 %-99 %] 97 % (05/20 0740) Weight:  [99.9 kg] 99.9 kg (05/20 0543)    Weight change: Filed Weights   03/25/24 0355 03/26/24 0355 03/28/24 0543  Weight: 100.3 kg 99.7 kg 99.9 kg   Intake/Output:  Intake/Output Summary (Last 24 hours) at 03/28/2024 1106 Last data filed at 03/28/2024 0900 Gross per 24 hour  Intake 990 ml  Output 1950 ml  Net -960 ml    Physical Exam    CVP 14 General: Well appearing. No distress on RA Cardiac: JVP elevated. S1 and S2 present. No murmurs or rub. Extremities: Warm and dry.  3+ LLE edema, R BKA with thigh edema Neuro: Alert and oriented x3. Affect pleasant. Moves all extremities without difficulty. Lines/Devices:   RUE PICC  Telemetry   VP in 80s (personally reviewed)  EKG    N/A  Labs   Basic Metabolic Panel: Recent Labs  Lab 03/24/24 2234 03/25/24 0222 03/26/24 0529 03/27/24 0603 03/28/24 0454  NA 136 135 137 136 136  K 3.2* 3.4* 3.2* 3.3* 4.3  CL 102 103 105 102 103  CO2 22 21* 22 24 24  GLUCOSE 115* 95 160* 151* 156*  BUN 49* 47* 43* 41* 44*  CREATININE 1.59* 1.54* 1.49* 1.63* 1.73*  CALCIUM  8.4* 8.5* 8.4* 8.4* 8.8*   Liver Function Tests: Recent Labs  Lab 03/24/24 2234 03/26/24 1301  AST 21 24  ALT 12 8  ALKPHOS 61 64  BILITOT 0.8 0.9  PROT 5.2* 5.7*  ALBUMIN  2.9* 2.8*   CBC: Recent Labs  Lab 03/24/24 2234  WBC 5.6  HGB 10.9*  HCT  33.7*  MCV 83.0  PLT 180   BNP (last 3 results) Recent Labs    11/14/23 0229 03/24/24 2239  BNP 1,274.6* 4,192.8*   CBG: Recent Labs  Lab 03/27/24 0633 03/27/24 1038 03/27/24 1650 03/27/24 2119 03/28/24 0646  GLUCAP 130* 162* 164* 167* 144*   Medications:    Current Medications:  amiodarone   400 mg Oral BID   Chlorhexidine  Gluconate Cloth  6 each Topical Daily   cholecalciferol   2,000 Units Oral Daily   clopidogrel   75 mg Oral Daily   ezetimibe   10 mg Oral Daily   gemfibrozil   600 mg Oral BID AC   insulin  aspart  0-20 Units Subcutaneous TID WC   insulin  aspart  0-5 Units Subcutaneous QHS   insulin  NPH Human  6 Units Subcutaneous QAC breakfast   melatonin  10 mg Oral QHS   midodrine   10 mg Oral TID WC   multivitamin with minerals  1 tablet Oral Daily   polyethylene glycol  17 g Oral Daily   rosuvastatin   20 mg Oral Daily   sodium chloride  flush  10-40 mL Intracatheter Q12H   sodium chloride  flush  3 mL Intravenous Q12H    Infusions:  furosemide  (LASIX ) 200 mg in dextrose  5 % 100 mL (2 mg/mL) infusion 10 mg/hr (03/28/24 0935)   Patient Profile   Eduardo [name] is 68 y.o. with history HFrEF d/t ICM s/p CRT-D, severe 3V CAD, STEMI, R SFA occlusion s/p PTA with 3 overlapping DES, T2DM, PAD s/p R BKA, CKD 3b, HTN, and HLD.   Assessment/Plan   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 Longstanding history of severely reduced EF <20-25% since 2021 with diastolic dysfunction and relatively ok RV. - Has CRT-D - Echo this admission with EF 20-25% and new severely reduced RV, mild MR - Admitted from cardiology clinic for IV diuresis. Has had minimal response. - CVP 14, significant LE edema.  - Continue Lasix  10/hr, increase this afternoon if UOP does not pick up with increase BP.  - Co-ox with slight drop overnight 65>51 - Hypotensive, stop midodrine . Start NE for CO and BP support SBP >95 - GDMT limited by hypotension and renal function - Prev worked up  for VAD in 2022, did not want to pursue. At this point would not be a candidate for advanced therapies. Will continue to optimize medically.  2. CAD - severe native three-vessel coronary artery disease (LAD 100%p LCX all marginals occluded< RCA diffuse distal 99%  - no chest pain - continue crestor , plavix , gemfibriozil, and ezetimibe   3. NSVT  - continue amio 400 mg bid  4. AKI on CKD 3b - b/l Cr 1.3-1.5 - sCr up to 1.73 today - continue diuresis and BP support as above  5. Cholangiocarcinoma of the liver - noted on small bowel resection in 1/25, biopsy with cholangiocarcinoma - oncology discussing liver mass resection  Discussed code status with patient. Confirmed that patient does not want CPR, however he is okay with intubation if for a  short period of time.  Length of Stay: 4  [place] Keyuana Wank, NP  03/28/2024, 11:06 AM  Advanced Heart Failure Team Pager [phone] (M-F; 7a - 5p)  Please contact [facility] for night-coverage after hours (4p -7a ) and weekends on [email or web address]

## 2024-03-28 NOTE — Progress Notes (Signed)
 Pt prefers to be on home NPH. Ok to switch back per Dr. Fulton Job.  Ivery Marking, PharmD, BCIDP, AAHIVP, CPP Infectious Disease Pharmacist 03/28/2024 5:48 PM

## 2024-03-28 NOTE — Consult Note (Addendum)
 Advanced Heart Failure Team Consult Note   Primary Physician: Roosvelt Colla, MD Cardiologist:  Cody Das, MD Reason for Consultation: A/C CHF w ?CGS HPI:    Eduardo [name] is seen today for evaluation of a/c biventricular systolic heart failure with concern for cardiogenic shock at the request of Dr. Nolan Battle.   Eduardo [name] is 68 y.o. with history HFrEF d/t ICM s/p CRT-D, severe 3V CAD, STEMI, R SFA occlusion s/p PTA with 3 overlapping DES, T2DM, PAD s/p R BKA, CKD 3b, HTN, and HLD.   Previously seen in [facility] 11/2020 and decided against LVAD. Follows with Dr. Filiberto Hug and Dr. Arlester Ladd.   Of note, recently underwent small bowel resection and anastomosis for small bowel obstruction where liver mass was noted. He underwent biopsy which showed poorly differentiated carcinoma, most consistent with cholangiocarcinoma.   He was seen 5/15 by Dr. Filiberto Hug with progressive LE edema. CRT-D showed moderate volume overload. His Torsemide  40 mg bid was changed to Bumex  2 mg bid and he was advised to come to the hospital for IV diuresis and CHF optimization in the setting of discussions of surgical resection of liver mass. His case is being discussed at GI tumor conference.   Presenting labs notable for K 3.2, BUN/Cr 49/1.59, BNP 4192, hgb 10.9. CXR showed pulm vascular congestion, He was started on IV Lasix , without urine response. PICC line placed. Coox has remained in 60s until this morning when it dropped to 51. His course has been complicated by hypotension and was started on midodrine , however hypotension has continued. He was transferred to the ICU for further BP management.   Remains with minimal UOP, weight is relatively unchanged from admission. CVP 14. SBP remains in low 80s. No dizziness or shortness of breath. Feels fatigued and swollen.   Home Medications Prior to Admission medications   Medication Sig Start Date End Date Taking? Authorizing Provider  acetaminophen  (TYLENOL ) 325  MG tablet Take 1-2 tablets (325-650 mg total) by mouth every 4 (four) hours as needed for mild pain. 03/31/22  Yes Setzer, Sandra J, PA-C  Bioflavonoid Products (ESTER C PO) Take 1,000 mg by mouth daily.   Yes [provider]  bumetanide  (BUMEX ) 2 MG tablet Take 1 tablet (2 mg total) by mouth 2 (two) times daily. 03/21/24  Yes Patwardhan, Manish J, MD  Cholecalciferol  (VITAMIN D ) 2000 units CAPS Take 2,000 Units by mouth daily.   Yes [provider]  clopidogrel  (PLAVIX ) 75 MG tablet Take 1 tablet (75 mg total) by mouth daily. 12/02/23  Yes Patwardhan, Kaye Parsons, MD  Continuous Glucose Sensor (FREESTYLE LIBRE 2 PLUS SENSOR) MISC 1 Application. 03/20/24  Yes [provider]  ezetimibe  (ZETIA ) 10 MG tablet TAKE 1 TABLET BY MOUTH DAILY 07/08/23  Yes Patwardhan, Manish J, MD  gemfibrozil  (LOPID ) 600 MG tablet Take 600 mg by mouth 2 (two) times daily before a meal.   Yes [provider]  HUMALOG KWIKPEN 100 UNIT/ML KiwkPen Inject 3-10 Units into the skin 3 (three) times daily. Inject 01-1009 units into the skin three times a day, per sliding scale- based on BGL >100 08/02/16  Yes [provider]  HUMULIN  N KWIKPEN 100 UNIT/ML KwikPen Inject 6 Units into the skin daily. 03/20/24  Yes [provider]  Oksana Bergamo Oil 300 MG CAPS Take 500 mg by mouth daily.   Yes [provider]  lidocaine  (XYLOCAINE ) 5 % ointment Apply 1 Application topically as needed. For pain as needed- can use  up to 4x/day 01/03/24  Yes Lovorn, Megan, MD  melatonin 10 MG TABS Take 10 mg by mouth at bedtime. 03/31/22  Yes Setzer, Sandra J, PA-C  Multiple Vitamin (MULTIVITAMIN WITH MINERALS) TABS tablet Take 1 tablet by mouth daily.   Yes [provider]  nitroGLYCERIN  (NITROSTAT ) 0.4 MG SL tablet Place 1 tablet (0.4 mg total) under the tongue every 5 (five) minutes as needed for chest pain. 05/08/21  Yes Cantwell, Celeste C, PA-C  Probiotic Product (PROBIOTIC-10 PO) Take 1 capsule by  mouth daily.   Yes [provider]  rosuvastatin  (CRESTOR ) 20 MG tablet Take 1 tablet (20 mg total) by mouth daily. 07/22/20  Yes Knox Perl, MD  sacubitril -valsartan  (ENTRESTO ) 24-26 MG TAKE 1 TABLET BY MOUTH TWICE  DAILY 10/11/23  Yes Patwardhan, Manish J, MD  sodium chloride  (OCEAN) 0.65 % SOLN nasal spray Place 1 spray into both nostrils as needed for congestion.   Yes [provider]  tirzepatide Florence Hunt) 12.5 MG/0.5ML Pen Inject 12.5 mg into the skin once a week.   Yes [provider]    Past Medical History: Past Medical History:  Diagnosis Date   CHF (congestive heart failure) (HCC)    Chronic kidney disease    stage 3   Colon polyp    Coronary artery disease    Diabetes mellitus 1987   under care of Dr. [name].  On insulin  since 96 (off and on)   Diabetic retinopathy    Dupuytren contracture    R hand, s/p injection (Dr. Jonna Netter)   Essential hypertension, benign    Essential hypertension, benign 02/06/2019   Frequency of urination and polyuria    Hypertension    Myocardial infarction [facility])    denies   Neuromuscular disorder (HCC)    Diabetic neuropathy   Osteomyelitis (HCC)    right foot   Other testicular hypofunction    Peripheral arterial disease (HCC) 10/28/2012   Peritoneal abscess (HCC) 6/08   and buttock.   Pneumonia    Polydipsia    Proteinuria    Pure hyperglyceridemia    Subacute osteomyelitis, right ankle and foot (HCC)    Wears glasses     Past Surgical History: Past Surgical History:  Procedure Laterality Date   ABDOMINAL AORTAGRAM N/A 04/18/2012   Procedure: ABDOMINAL Tommi Fraise;  Surgeon: Dannis Dy, MD;  Location: [facility] CATH LAB;  Service: Cardiovascular;  Laterality: N/A;   AMPUTATION Right 05/19/2019   Procedure: RIGHT FOOT 5TH RAY AMPUTATION;  Surgeon: Timothy Ford, MD;  Location: [facility] OR;  Service: Orthopedics;  Laterality: Right;   AMPUTATION Right 03/11/2022   Procedure: RIGHT LEG DEBRIDEMENT VS. BELOW KNEE  AMPUTATION;  Surgeon: Timothy Ford, MD;  Location: [facility] OR;  Service: Orthopedics;  Laterality: Right;   BIV ICD INSERTION CRT-D N/A 10/21/2022   Procedure: BIV ICD INSERTION CRT-D;  Surgeon: Tammie Fall, MD;  Location: [facility] INVASIVE CV LAB;  Service: Cardiovascular;  Laterality: N/A;   BOWEL RESECTION N/A 11/14/2023   Procedure: SMALL BOWEL RESECTION;  Surgeon: Dorrie Gaudier, Alphonso Aschoff, MD;  Location: [facility] OR;  Service: General;  Laterality: N/A;   CARDIAC CATHETERIZATION N/A 09/08/2016   Procedure: Left Heart Cath and Coronary Angiography;  Surgeon: Knox Perl, MD;  Location: [facility] INVASIVE CV LAB;  Service: Cardiovascular;  Laterality: N/A;   CATARACT EXTRACTION, BILATERAL  09/2017, 10/2017   Dr. Lasandra Points   COLONOSCOPY W/ BIOPSIES AND POLYPECTOMY     CORONARY/GRAFT ACUTE MI REVASCULARIZATION N/A 10/11/2020   Procedure: Coronary/Graft Acute  MI Revascularization;  Surgeon: Knox Perl, MD;  Location: [facility] INVASIVE CV LAB;  Service: Cardiovascular;  Laterality: N/A;   I & D EXTREMITY Right 03/11/2022   Procedure: BELOW KNEE AMPUTATION;  Surgeon: Timothy Ford, MD;  Location: [facility] OR;  Service: Orthopedics;  Laterality: Right;   INSERT / REPLACE / REMOVE PACEMAKER     LAPAROSCOPY N/A 11/14/2023   Procedure: LAPAROSCOPY DIAGNOSTIC;  Surgeon: Dorrie Gaudier, Alphonso Aschoff, MD;  Location: [facility] OR;  Service: General;  Laterality: N/A;  Converted to open ex-lap after inserting first trocar   LAPAROTOMY N/A 11/14/2023   Procedure: EXPLORATORY LAPAROTOMY;  Surgeon: Dorrie Gaudier Alphonso Aschoff, MD;  Location: [facility] OR;  Service: General;  Laterality: N/A;   LEFT HEART CATH N/A 12/31/2020   Procedure: Left Heart Cath;  Surgeon: Knox Perl, MD;  Location: [facility] INVASIVE CV LAB;  Service: Cardiovascular;  Laterality: N/A;   LEFT HEART CATH AND CORONARY ANGIOGRAPHY N/A 10/11/2020   Procedure: LEFT HEART CATH AND CORONARY ANGIOGRAPHY;  Surgeon: Knox Perl, MD;  Location: [facility] INVASIVE CV LAB;  Service: Cardiovascular;  Laterality: N/A;   LOWER  EXTREMITY ANGIOGRAM Bilateral 04/18/2012   Procedure: LOWER EXTREMITY ANGIOGRAM;  Surgeon: Dannis Dy, MD;  Location: [facility] CATH LAB;  Service: Cardiovascular;  Laterality: Bilateral;  bilat lower extrem angio   LOWER EXTREMITY ANGIOGRAPHY Bilateral 05/02/2019   Procedure: LOWER EXTREMITY ANGIOGRAPHY;  Surgeon: Knox Perl, MD;  Location: [facility] INVASIVE CV LAB;  Service: Cardiovascular;  Laterality: Bilateral;   LOWER EXTREMITY ANGIOGRAPHY Bilateral 02/28/2019   Procedure: LOWER EXTREMITY ANGIOGRAPHY;  Surgeon: Knox Perl, MD;  Location: [facility] INVASIVE CV LAB;  Service: Cardiovascular;  Laterality: Bilateral;   macular photocoagulation     (eye treatments for diabetic retinopathy)-Dr. Augustus Ledger   PERIPHERAL VASCULAR INTERVENTION  02/28/2019   Procedure: PERIPHERAL VASCULAR INTERVENTION;  Surgeon: Knox Perl, MD;  Location: [facility] INVASIVE CV LAB;  Service: Cardiovascular;;   TEE WITHOUT CARDIOVERSION N/A 03/18/2022   Procedure: TRANSESOPHAGEAL ECHOCARDIOGRAM (TEE);  Surgeon: Knox Perl, MD;  Location: [facility] ENDOSCOPY;  Service: Cardiovascular;  Laterality: N/A;   VENTRICULAR ASSIST DEVICE INSERTION N/A 12/31/2020   Procedure: VENTRICULAR ASSIST DEVICE INSERTION;  Surgeon: Knox Perl, MD;  Location: [facility] INVASIVE CV LAB;  Service: Cardiovascular;  Laterality: N/A;    Family History: Family History  Problem Relation Age of Onset   Diabetes Mother    Hearing loss Mother    Hypertension Mother    Hyperlipidemia Mother    Heart disease Mother    Varicose Veins Mother    Varicose Veins Father    Dementia Father    Hyperlipidemia Brother    Diabetes Maternal Grandmother     Social History: Social History   Socioeconomic History   Marital status: Widowed    Spouse name: Not on file   Number of children: 0   Years of education: Not on file   Highest education level: Not on file  Occupational History   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 Retired. 1 cat.  Girlfriend and her daughter lives with him. Enjoys fishing.   Updated 06/2021   Social Drivers of Health   Financial Resource Strain: Low Risk  (07/28/2023)   Overall Financial Resource Strain (CARDIA)    Difficulty of Paying Living Expenses: Not hard at all  Food Insecurity: No Food Insecurity (03/24/2024)   Hunger Vital Sign    Worried About Running Out of Food in the Last Year: Never true    Ran Out of Food in the Last Year: Never true  Transportation Needs: No Transportation Needs (03/24/2024)   PRAPARE - Administrator, Civil Service (Medical): No    Lack of Transportation (Non-Medical): No  Physical Activity: Sufficiently Active (07/28/2023)   Exercise Vital Sign    Days of Exercise per Week: 7 days    Minutes of Exercise per Session: 90 min  Stress: No Stress Concern Present (07/28/2023)   [employer] of Occupational Health - Occupational Stress Questionnaire    Feeling of Stress : Not at all  Social Connections: Socially Isolated (03/24/2024)   Social Connection and Isolation Panel [NHANES]    Frequency of Communication with Friends and Family: Never    Frequency of Social Gatherings with Friends and Family: Never    Attends Religious Services: Never    Database administrator or Organizations: No    Attends Banker Meetings: Never    Marital Status: Living with partner     Allergies:  Allergies  Allergen Reactions   Empagliflozin      Other Reaction(s): foot infection   Shellfish-Derived Products Nausea And Vomiting and Other (See Comments)    Only Mussels cause severe nausea and vomiting    Latex Rash   Tape Rash and Other (See Comments)    Caused issues with the skin   Testosterone  Rash    did not feel well     Objective:    Vital Signs:   Temp:  [97.6 F (36.4 C)-98 F (36.7 C)] 98 F (36.7 C) (05/20 0740) Pulse Rate:  [65-88] 77 (05/20 0740) Resp:  [17-20] 17 (05/20 0740) BP: (84-95)/(54-64) 88/54 (05/20 0740) SpO2:  [97 %-99 %] 97 % (05/20 0740) Weight:  [99.9 kg] 99.9 kg (05/20 0543)    Weight change: Filed Weights   03/25/24 0355 03/26/24 0355 03/28/24 0543  Weight: 100.3 kg 99.7 kg 99.9 kg   Intake/Output:  Intake/Output Summary (Last 24 hours) at 03/28/2024 1106 Last data filed at 03/28/2024 0900 Gross per 24 hour  Intake 990 ml  Output 1950 ml  Net -960 ml    Physical Exam    CVP 14 General: Well appearing. No distress on RA Cardiac: JVP elevated. S1 and S2 present. No murmurs or rub. Extremities: Warm and dry.  3+ LLE edema, R BKA with thigh edema Neuro: Alert and oriented x3. Affect pleasant. Moves all extremities without difficulty. Lines/Devices:   RUE PICC  Telemetry   VP in 80s (personally reviewed)  EKG    N/A  Labs   Basic Metabolic Panel: Recent Labs  Lab 03/24/24 2234 03/25/24 0222 03/26/24 0529 03/27/24 0603 03/28/24 0454  NA 136 135 137 136 136  K 3.2* 3.4* 3.2* 3.3* 4.3  CL 102 103 105 102 103  CO2 22 21* 22 24 24  GLUCOSE 115* 95 160* 151* 156*  BUN 49* 47* 43* 41* 44*  CREATININE 1.59* 1.54* 1.49* 1.63* 1.73*  CALCIUM  8.4* 8.5* 8.4* 8.4* 8.8*   Liver Function Tests: Recent Labs  Lab 03/24/24 2234 03/26/24 1301  AST 21 24  ALT 12 8  ALKPHOS 61 64  BILITOT 0.8 0.9  PROT 5.2* 5.7*  ALBUMIN  2.9* 2.8*   CBC: Recent Labs  Lab 03/24/24 2234  WBC 5.6  HGB 10.9*  HCT  33.7*  MCV 83.0  PLT 180   BNP (last 3 results) Recent Labs    11/14/23 0229 03/24/24 2239  BNP 1,274.6* 4,192.8*   CBG: Recent Labs  Lab 03/27/24 0633 03/27/24 1038 03/27/24 1650 03/27/24 2119 03/28/24 0646  GLUCAP 130* 162* 164* 167* 144*   Medications:    Current Medications:  amiodarone   400 mg Oral BID   Chlorhexidine  Gluconate Cloth  6 each Topical Daily   cholecalciferol   2,000 Units Oral Daily   clopidogrel   75 mg Oral Daily   ezetimibe   10 mg Oral Daily   gemfibrozil   600 mg Oral BID AC   insulin  aspart  0-20 Units Subcutaneous TID WC   insulin  aspart  0-5 Units Subcutaneous QHS   insulin  NPH Human  6 Units Subcutaneous QAC breakfast   melatonin  10 mg Oral QHS   midodrine   10 mg Oral TID WC   multivitamin with minerals  1 tablet Oral Daily   polyethylene glycol  17 g Oral Daily   rosuvastatin   20 mg Oral Daily   sodium chloride  flush  10-40 mL Intracatheter Q12H   sodium chloride  flush  3 mL Intravenous Q12H    Infusions:  furosemide  (LASIX ) 200 mg in dextrose  5 % 100 mL (2 mg/mL) infusion 10 mg/hr (03/28/24 0935)   Patient Profile   Eduardo [name] is 68 y.o. with history HFrEF d/t ICM s/p CRT-D, severe 3V CAD, STEMI, R SFA occlusion s/p PTA with 3 overlapping DES, T2DM, PAD s/p R BKA, CKD 3b, HTN, and HLD.   Assessment/Plan   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 Longstanding history of severely reduced EF <20-25% since 2021 with diastolic dysfunction and relatively ok RV. - Has CRT-D - Echo this admission with EF 20-25% and new severely reduced RV, mild MR - Admitted from cardiology clinic for IV diuresis. Has had minimal response. - CVP 14, significant LE edema.  - Continue Lasix  10/hr, increase this afternoon if UOP does not pick up with increase BP.  - Co-ox with slight drop overnight 65>51 - Hypotensive, stop midodrine . Start NE for CO and BP support SBP >95 - GDMT limited by hypotension and renal function - Prev worked up  for VAD in 2022, did not want to pursue. At this point would not be a candidate for advanced therapies. Will continue to optimize medically.  2. CAD - severe native three-vessel coronary artery disease (LAD 100%p LCX all marginals occluded< RCA diffuse distal 99%  - no chest pain - continue crestor , plavix , gemfibriozil, and ezetimibe   3. NSVT  - continue amio 400 mg bid  4. AKI on CKD 3b - b/l Cr 1.3-1.5 - sCr up to 1.73 today - continue diuresis and BP support as above  5. Cholangiocarcinoma of the liver - noted on small bowel resection in 1/25, biopsy with cholangiocarcinoma - oncology discussing liver mass resection  Discussed code status with patient. Confirmed that patient does not want CPR, however he is okay with intubation if for a  short period of time.  Length of Stay: 4  [place] Keyuana Wank, NP  03/28/2024, 11:06 AM  Advanced Heart Failure Team Pager [phone] (M-F; 7a - 5p)  Please contact [facility] for night-coverage after hours (4p -7a ) and weekends on [email or web address]

## 2024-03-28 NOTE — Consult Note (Signed)
 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  Admitted to cardiology, PICC placed, lasix  gtt, and midodrine  started due to SBP 80-90's.  Pt reports his SBP runs on lower side from 80-100's on his home entresto  which is accurate on chart review.  Midodrine  increased 5/19.  Repeat echo showed EF now 20-25% down from 30-35% with severely reduced RV, large left pleural effusion, and mild MR.  Coox's have trended 61> 64 but today down to 51.  Pt denies any complaints of CP, SOB, orthopnea, headache, dizziness, N/V, fever, chills, or abd pain.  States does feel like his swelling is better but just slow progress.  Due to decrease in coox and lower BP's, pt transferred to ICU for probable pressors and intropes.   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, cholangiocarcinoma, small bowel resection for SBO 11/16/23), diabetic retinopathy, diabetic neuropathy  Former smoker for 37yrs, 2ppd at heaviest- quit 2013 Rare ETOH  Significant Hospital Events: Including procedures, antibiotic start and stop dates in addition to other pertinent events   5/16 admit 5/17 PICC  Interim History / Subjective:  No complaints   Objective    Blood pressure (!) 88/54, pulse 77, temperature 98 F (36.7 C), temperature source Oral,  resp. rate 17, height 5\' 9"  (1.753 m), weight 99.9 kg, SpO2 97%. CVP:  [10 mmHg-11 mmHg] 11 mmHg      Intake/Output Summary (Last 24 hours) at 03/28/2024 1130 Last data filed at 03/28/2024 0900 Gross per 24 hour  Intake 750 ml  Output 1950 ml  Net -1200 ml   Filed Weights   03/25/24 0355 03/26/24 0355 03/28/24 0543  Weight: 100.3 kg 99.7 kg 99.9 kg    Examination: General:  Pleasant well nourished male sitting in bed in NAD HEENT: MM pink/moist, pupils 3/r, anicteric, mod JVP Neuro: alert, oriented x 4, MAE CV: vpaced, no murmur PULM:  non labored, clear, slightly diminished in left base, RA GI: soft, bs+, NT, voids- last BM today Extremities: warm/dry, R BKA,  +1 RLE edema  Skin: no rashes  UOP 1.5L plus 1 unmeasured  - 833 Net -4.6L Wts 101.8 on admit> today 99.9 Labs reviewed> K 4.3, BUN/ sCr 44/ 1.73 CVP 11  Resolved problem list   Assessment and Plan    Acute on chronic systolic HF with volume overload  Multivessel CAD HTN- although SBP range 80-90's on EMR review back to 11-16-2023 - echo 5/18> EF 20-25%, mild MR, RV severely reduced P:  - per AHF team - cont to trend CVP/ coox via PICC - NE for goal MAP > 65, possible milrinone  - holding midodrine   - lasix  gtt per AHF - strict I/Os, trending renal indices - plavix , ezetimibe , gemfibrozil , rosuvastatin  - hypotension limites GDMT, home entresto  held  - check LE dopplers to r/o DVT    NSVT -  BP will not tolerate BB.  Started on amiodarone  400mg  BID - tele monitoring - optimize electrolytes   Bilateral pleural effusions, L> R likely in the setting of volume overload - diurese as above - CXR prn - no infectious symptoms, cough, or leukocytosis    CKD 3a - baseline 1.3-1.5, today 1.73 - lasix  gtt as above - trend renal indices  - strict I/Os, daily wts - avoid nephrotoxins, renal dose meds, hemodynamic support as above   T2DM - CBG  w/AC/ HS with semglee    Cholangiocarcinoma - dx after  recent biopsy 03/16/24, followed by Dr. Scherrie Curt, treatment course pending cardiac optimization and if surgery candidate    GOC - pt states he has a state DNR form at home.  Does not want CPR or longterm life support, ok with short term intubation if needed.  Emergency contact- SO> Ammon Bales McDermott who is also his HPOA per pt   Best Practice (right click and "Reselect all SmartList Selections" daily)   Diet/type: Regular consistency (see orders) DVT prophylaxis prophylactic heparin   Pressure ulcer(s): N/A GI prophylaxis: N/A Lines: RUE PICC 5/17 Foley:  N/A Code Status:  limited, no CPR Last date of multidisciplinary goals of care discussion [5/20]  Labs   CBC: Recent Labs  Lab 03/24/24 2234  WBC 5.6  HGB 10.9*  HCT 33.7*  MCV 83.0  PLT 180    Basic Metabolic Panel: Recent Labs  Lab 03/24/24 2234 03/25/24 0222 03/26/24 0529 03/27/24 0603 03/28/24 0454  NA 136 135 137 136 136  K 3.2* 3.4* 3.2* 3.3* 4.3  CL 102 103 105 102 103  CO2 22 21* 22 24 24   GLUCOSE 115* 95 160* 151* 156*  BUN 49* 47* 43* 41* 44*  CREATININE 1.59* 1.54* 1.49* 1.63* 1.73*  CALCIUM  8.4* 8.5* 8.4* 8.4* 8.8*   GFR: Estimated Creatinine Clearance: 47.6 mL/min (A) (by C-G formula based on SCr of 1.73 mg/dL (H)). Recent Labs  Lab 03/24/24 2234 03/25/24 1008  WBC 5.6  --   LATICACIDVEN  --  0.9    Liver Function Tests: Recent Labs  Lab 03/24/24 2234 03/26/24 1301  AST 21 24  ALT 12 8  ALKPHOS 61 64  BILITOT 0.8 0.9  PROT 5.2* 5.7*  ALBUMIN  2.9* 2.8*   No results for input(s): "LIPASE", "AMYLASE" in the last 168 hours. No results for input(s): "AMMONIA" in the last 168 hours.  ABG    Component Value Date/Time   PHART 7.267 (L) 10/11/2020 0918   PCO2ART 54.1 (H) 10/11/2020 0918   PO2ART 105 10/11/2020 0918   HCO3 24.7 10/11/2020 0918   TCO2 25 11/13/2023 2241   ACIDBASEDEF 3.0 (H) 10/11/2020 0918   O2SAT 51 03/28/2024 0454     Coagulation Profile: No results for input(s):  "INR", "PROTIME" in the last 168 hours.  Cardiac Enzymes: No results for input(s): "CKTOTAL", "CKMB", "CKMBINDEX", "TROPONINI" in the last 168 hours.  HbA1C: Hemoglobin A1C  Date/Time Value Ref Range Status  10/19/2023 12:00 AM 6.6  Final  04/22/2023 12:00 AM 6.9  Final   Hgb A1c MFr Bld  Date/Time Value Ref Range Status  03/24/2024 10:39 PM 6.7 (H) 4.8 - 5.6 % Final    Comment:    (NOTE) Pre diabetes:          5.7%-6.4%  Diabetes:              >6.4%  Glycemic control for   <7.0% adults with diabetes     CBG: Recent Labs  Lab  03/27/24 1610 03/27/24 1038 03/27/24 1650 03/27/24 2119 03/28/24 0646  GLUCAP 130* 162* 164* 167* 144*    Review of Systems:   Positive for fatigue and LE edema otherwise neg  Past Medical History:  He,  has a past medical history of CHF (congestive heart failure) (HCC), Chronic kidney disease, Colon polyp, Coronary artery disease, Diabetes mellitus (1987), Diabetic retinopathy, Dupuytren contracture, Essential hypertension, benign, Essential hypertension, benign (02/06/2019), Frequency of urination and polyuria, Hypertension, Myocardial infarction [facility]), Neuromuscular disorder (HCC), Osteomyelitis (HCC), Other testicular hypofunction, Peripheral arterial disease (HCC) (10/28/2012), Peritoneal abscess (HCC) (6/08), Pneumonia, Polydipsia, Proteinuria, Pure hyperglyceridemia, Subacute osteomyelitis, right ankle and foot (HCC), and Wears glasses.   Surgical History:   Past Surgical History:  Procedure Laterality Date   ABDOMINAL AORTAGRAM N/A 04/18/2012   Procedure: ABDOMINAL Tommi Fraise;  Surgeon: Dannis Dy, MD;  Location[facility] CATH LAB;  Service: Cardiovascular;  Laterality: N/A;   AMPUTATION Right 05/19/2019   Procedure: RIGHT FOOT 5TH RAY AMPUTATION;  Surgeon: Timothy Ford, MD;  Location: [facility] OR;  Service: Orthopedics;  Laterality: Right;   AMPUTATION Right 03/11/2022   Procedure: RIGHT LEG DEBRIDEMENT VS. BELOW KNEE AMPUTATION;   Surgeon: Timothy Ford, MD;  Location: [facility] OR;  Service: Orthopedics;  Laterality: Right;   BIV ICD INSERTION CRT-D N/A 10/21/2022   Procedure: BIV ICD INSERTION CRT-D;  Surgeon: Tammie Fall, MD;  Location: [facility] INVASIVE CV LAB;  Service: Cardiovascular;  Laterality: N/A;   BOWEL RESECTION N/A 11/14/2023   Procedure: SMALL BOWEL RESECTION;  Surgeon: Dorrie Gaudier, Alphonso Aschoff, MD;  Location: [facility] OR;  Service: General;  Laterality: N/A;   CARDIAC CATHETERIZATION N/A 09/08/2016   Procedure: Left Heart Cath and Coronary Angiography;  Surgeon: Knox Perl, MD;  Location: [facility] INVASIVE CV LAB;  Service: Cardiovascular;  Laterality: N/A;   CATARACT EXTRACTION, BILATERAL  09/2017, 10/2017   Dr. Lasandra Points   COLONOSCOPY W/ BIOPSIES AND POLYPECTOMY     CORONARY/GRAFT ACUTE MI REVASCULARIZATION N/A 10/11/2020   Procedure: Coronary/Graft Acute MI Revascularization;  Surgeon: Knox Perl, MD;  Location: [facility] INVASIVE CV LAB;  Service: Cardiovascular;  Laterality: N/A;   I & D EXTREMITY Right 03/11/2022   Procedure: BELOW KNEE AMPUTATION;  Surgeon: Timothy Ford, MD;  Location: [facility] OR;  Service: Orthopedics;  Laterality: Right;   INSERT / REPLACE / REMOVE PACEMAKER     LAPAROSCOPY N/A 11/14/2023   Procedure: LAPAROSCOPY DIAGNOSTIC;  Surgeon: Dorrie Gaudier, Alphonso Aschoff, MD;  Location: [facility] OR;  Service: General;  Laterality: N/A;  Converted to open ex-lap after inserting first trocar   LAPAROTOMY N/A 11/14/2023   Procedure: EXPLORATORY LAPAROTOMY;  Surgeon: Dorrie Gaudier Alphonso Aschoff, MD;  Location: [facility] OR;  Service: General;  Laterality: N/A;   LEFT HEART CATH N/A 12/31/2020   Procedure: Left Heart Cath;  Surgeon: Knox Perl, MD;  Location: [facility] INVASIVE CV LAB;  Service: Cardiovascular;  Laterality: N/A;   LEFT HEART CATH AND CORONARY ANGIOGRAPHY N/A 10/11/2020   Procedure: LEFT HEART CATH AND CORONARY ANGIOGRAPHY;  Surgeon: Knox Perl, MD;  Location: [facility] INVASIVE CV LAB;  Service: Cardiovascular;  Laterality: N/A;   LOWER EXTREMITY  ANGIOGRAM Bilateral 04/18/2012   Procedure: LOWER EXTREMITY ANGIOGRAM;  Surgeon: Dannis Dy, MD;  Location: [facility] CATH LAB;  Service: Cardiovascular;  Laterality: Bilateral;  bilat lower extrem angio   LOWER EXTREMITY ANGIOGRAPHY Bilateral 05/02/2019   Procedure: LOWER EXTREMITY ANGIOGRAPHY;  Surgeon: Knox Perl, MD;  Location: [facility] INVASIVE CV LAB;  Service: Cardiovascular;  Laterality: Bilateral;   LOWER EXTREMITY ANGIOGRAPHY  Bilateral 02/28/2019   Procedure: LOWER EXTREMITY ANGIOGRAPHY;  Surgeon: Knox Perl, MD;  Location: [facility] INVASIVE CV LAB;  Service: Cardiovascular;  Laterality: Bilateral;   macular photocoagulation     (eye treatments for diabetic retinopathy)-Dr. Augustus Ledger   PERIPHERAL VASCULAR INTERVENTION  02/28/2019   Procedure: PERIPHERAL VASCULAR INTERVENTION;  Surgeon: Knox Perl, MD;  Location: [facility] INVASIVE CV LAB;  Service: Cardiovascular;;   TEE WITHOUT CARDIOVERSION N/A 03/18/2022   Procedure: TRANSESOPHAGEAL ECHOCARDIOGRAM (TEE);  Surgeon: Knox Perl, MD;  Location: [facility] ENDOSCOPY;  Service: Cardiovascular;  Laterality: N/A;   VENTRICULAR ASSIST DEVICE INSERTION N/A 12/31/2020   Procedure: VENTRICULAR ASSIST DEVICE INSERTION;  Surgeon: Knox Perl, MD;  Location: [facility] INVASIVE CV LAB;  Service: Cardiovascular;  Laterality: N/A;     Social History:   reports that he quit smoking about 12 years ago. His smoking use included cigarettes. He started smoking about 42 years ago. He has a 30 pack-year smoking history. He has never used smokeless tobacco. He reports that he does not currently use alcohol. He reports that he does not use drugs.   Family History:  His family history includes Dementia in his father; Diabetes in his maternal grandmother and mother; Hearing loss in his mother; Heart disease in his mother; Hyperlipidemia in his brother and mother; Hypertension in his mother; Varicose Veins in his father and mother.   Allergies Allergies  Allergen Reactions   Empagliflozin       Other Reaction(s): foot infection   Shellfish-Derived Products Nausea And Vomiting and Other (See Comments)    Only Mussels cause severe nausea and vomiting    Latex Rash   Tape Rash and Other (See Comments)    Caused issues with the skin   Testosterone  Rash    did not feel well      Home Medications  Prior to Admission medications   Medication Sig Start Date End Date Taking? Authorizing Provider  acetaminophen  (TYLENOL ) 325 MG tablet Take 1-2 tablets (325-650 mg total) by mouth every 4 (four) hours as needed for mild pain. 03/31/22  Yes Setzer, Sandra J, PA-C  Bioflavonoid Products (ESTER C PO) Take 1,000 mg by mouth daily.   Yes [provider]  bumetanide  (BUMEX ) 2 MG tablet Take 1 tablet (2 mg total) by mouth 2 (two) times daily. 03/21/24  Yes Patwardhan, Manish J, MD  Cholecalciferol  (VITAMIN D ) 2000 units CAPS Take 2,000 Units by mouth daily.   Yes [provider]  clopidogrel  (PLAVIX ) 75 MG tablet Take 1 tablet (75 mg total) by mouth daily. 12/02/23  Yes Patwardhan, Kaye Parsons, MD  Continuous Glucose Sensor (FREESTYLE LIBRE 2 PLUS SENSOR) MISC 1 Application. 03/20/24  Yes [provider]  ezetimibe  (ZETIA ) 10 MG tablet TAKE 1 TABLET BY MOUTH DAILY 07/08/23  Yes Patwardhan, Manish J, MD  gemfibrozil  (LOPID ) 600 MG tablet Take 600 mg by mouth 2 (two) times daily before a meal.   Yes [provider]  HUMALOG KWIKPEN 100 UNIT/ML KiwkPen Inject 3-10 Units into the skin 3 (three) times daily. Inject 01-1009 units into the skin three times a day, per sliding scale- based on BGL >100 08/02/16  Yes [provider]  HUMULIN  N KWIKPEN 100 UNIT/ML KwikPen Inject 6 Units into the skin daily. 03/20/24  Yes [provider]  Oksana Bergamo Oil 300 MG CAPS Take 500 mg by mouth daily.   Yes [provider]  lidocaine  (XYLOCAINE ) 5 % ointment Apply 1 Application topically as needed. For pain as needed- can use up to 4x/day  01/03/24  Yes Lovorn, Megan, MD   melatonin 10 MG TABS Take 10 mg by mouth at bedtime. 03/31/22  Yes Setzer, Sandra J, PA-C  Multiple Vitamin (MULTIVITAMIN WITH MINERALS) TABS tablet Take 1 tablet by mouth daily.   Yes [provider]  nitroGLYCERIN  (NITROSTAT ) 0.4 MG SL tablet Place 1 tablet (0.4 mg total) under the tongue every 5 (five) minutes as needed for chest pain. 05/08/21  Yes Cantwell, Celeste C, PA-C  Probiotic Product (PROBIOTIC-10 PO) Take 1 capsule by mouth daily.   Yes [provider]  rosuvastatin  (CRESTOR ) 20 MG tablet Take 1 tablet (20 mg total) by mouth daily. 07/22/20  Yes Knox Perl, MD  sacubitril -valsartan  (ENTRESTO ) 24-26 MG TAKE 1 TABLET BY MOUTH TWICE  DAILY 10/11/23  Yes Patwardhan, Manish J, MD  sodium chloride  (OCEAN) 0.65 % SOLN nasal spray Place 1 spray into both nostrils as needed for congestion.   Yes [provider]  tirzepatide Florence Hunt) 12.5 MG/0.5ML Pen Inject 12.5 mg into the skin once a week.   Yes [provider]     Critical care time: 45 mins       Early Glisson, MSN, AG-ACNP-BC [facility] 03/28/2024, 12:58 PM  See Amion for pager If no response to pager , please call [phone] until 7pm After 7:00 pm call Elink  [phone]

## 2024-03-28 NOTE — Progress Notes (Signed)
 Rounding Note    Patient Name: Eduardo [name] Date of Encounter: 03/28/2024  [facility] Cardiologist: Cody Das, MD   Subjective   Breathing stable. No significant chest pain. Chronically low blood pressure at home.   Inpatient Medications    Scheduled Meds:  amiodarone   400 mg Oral BID   Chlorhexidine  Gluconate Cloth  6 each Topical Daily   cholecalciferol   2,000 Units Oral Daily   clopidogrel   75 mg Oral Daily   ezetimibe   10 mg Oral Daily   gemfibrozil   600 mg Oral BID AC   insulin  aspart  0-20 Units Subcutaneous TID WC   insulin  aspart  0-5 Units Subcutaneous QHS   insulin  NPH Human  6 Units Subcutaneous QAC breakfast   melatonin  10 mg Oral QHS   midodrine   10 mg Oral TID WC   multivitamin with minerals  1 tablet Oral Daily   polyethylene glycol  17 g Oral Daily   rosuvastatin   20 mg Oral Daily   sodium chloride  flush  10-40 mL Intracatheter Q12H   sodium chloride  flush  3 mL Intravenous Q12H   Continuous Infusions:  furosemide  (LASIX ) 200 mg in dextrose  5 % 100 mL (2 mg/mL) infusion 10 mg/hr (03/28/24 0935)   PRN Meds: acetaminophen , ondansetron  (ZOFRAN ) IV, sodium chloride  flush, sodium chloride  flush   Vital Signs    Vitals:   03/27/24 2033 03/28/24 0157 03/28/24 0543 03/28/24 0740  BP: (!) 89/57 95/60 (!) 84/55 (!) 88/54  Pulse: 88 81 65 77  Resp: 20 20 20 17   Temp: 97.6 F (36.4 C) 97.7 F (36.5 C) 97.6 F (36.4 C) 98 F (36.7 C)  TempSrc: Oral Oral Oral Oral  SpO2: 98% 97% 99% 97%  Weight:   99.9 kg   Height:        Intake/Output Summary (Last 24 hours) at 03/28/2024 0947 Last data filed at 03/28/2024 0900 Gross per 24 hour  Intake 990 ml  Output 1950 ml  Net -960 ml      03/28/2024    5:43 AM 03/26/2024    3:55 AM 03/25/2024    3:55 AM  Last 3 Weights  Weight (lbs) 220 lb 3.8 oz 219 lb 12.8 oz 221 lb 1.9 oz  Weight (kg) 99.9 kg 99.7 kg 100.3 kg      Telemetry    Paced rhythm, 1 episode of NSVT - Personally  Reviewed  ECG    Atrial sensed ventricular paced rhythm - Personally Reviewed  Physical Exam   GEN: No acute distress.   Neck: No JVD Cardiac: RRR, no murmurs, rubs, or gallops.  Respiratory: Clear to auscultation bilaterally. GI: Soft, nontender, non-distended  MS: 1+ edema; R BKA. Neuro:  Nonfocal  Psych: Normal affect   Labs    High Sensitivity Troponin:  No results for input(s): "TROPONINIHS" in the last 720 hours.   Chemistry Recent Labs  Lab 03/24/24 2234 03/25/24 0222 03/26/24 0529 03/26/24 1301 03/27/24 0603 03/28/24 0454  NA 136   < > 137  --  136 136  K 3.2*   < > 3.2*  --  3.3* 4.3  CL 102   < > 105  --  102 103  CO2 22   < > 22  --  24 24  GLUCOSE 115*   < > 160*  --  151* 156*  BUN 49*   < > 43*  --  41* 44*  CREATININE 1.59*   < > 1.49*  --  1.63* 1.73*  CALCIUM  8.4*   < > 8.4*  --  8.4* 8.8*  PROT 5.2*  --   --  5.7*  --   --   ALBUMIN  2.9*  --   --  2.8*  --   --   AST 21  --   --  24  --   --   ALT 12  --   --  8  --   --   ALKPHOS 61  --   --  64  --   --   BILITOT 0.8  --   --  0.9  --   --   GFRNONAA 47*   < > 51*  --  46* 42*  ANIONGAP 12   < > 10  --  10 9   < > = values in this interval not displayed.    Lipids No results for input(s): "CHOL", "TRIG", "HDL", "LABVLDL", "LDLCALC", "CHOLHDL" in the last 168 hours.  Hematology Recent Labs  Lab 03/24/24 2234  WBC 5.6  RBC 4.06*  HGB 10.9*  HCT 33.7*  MCV 83.0  MCH 26.8  MCHC 32.3  RDW 19.4*  PLT 180   Thyroid  No results for input(s): "TSH", "FREET4" in the last 168 hours.  BNP Recent Labs  Lab 03/24/24 2239  BNP 4,192.8*    DDimer No results for input(s): "DDIMER" in the last 168 hours.   Radiology    ECHOCARDIOGRAM COMPLETE Result Date: 03/26/2024    ECHOCARDIOGRAM REPORT   Patient Name:   Eduardo [name] Date of Exam: 03/26/2024 Medical Rec #:  [id number]    Height:       69.0 in Accession #:    [id number]   Weight:       219.8 lb Date of Birth:  10-22-1956   BSA:           2.151 m Patient Age:    67 years     BP:           89/66 mmHg Patient Gender: M            HR:           85 bpm. Exam Location:  Inpatient Procedure: 2D Echo, Color Doppler, Cardiac Doppler and Intracardiac            Opacification Agent (Both Spectral and Color Flow Doppler were            utilized during procedure). Indications:    CHF  History:        Patient has prior history of Echocardiogram examinations, most                 recent 09/22/2023. Risk Factors:Diabetes and Hypertension.  Sonographer:    Janette Medley Referring Phys: [id number] VISHNU P MALLIPEDDI IMPRESSIONS  1. No LV thrombus by Definity . Left ventricular ejection fraction, by estimation, is 20 to 25%. The left ventricle has severely decreased function. The left ventricle demonstrates regional wall motion abnormalities (see scoring diagram/findings for description). The left ventricular internal cavity size was severely dilated. There is moderate asymmetric left ventricular hypertrophy of the septal segment. Left ventricular diastolic function could not be evaluated.  2. Right ventricular systolic function is severely reduced. The right ventricular size is mildly enlarged. Tricuspid regurgitation signal is inadequate for assessing PA pressure.  3. Left atrial size was mildly dilated.  4. Large pleural effusion in the left lateral region.  5. The mitral valve is normal in structure. Mild mitral valve  regurgitation. No evidence of mitral stenosis.  6. The aortic valve is tricuspid. There is moderate calcification of the aortic valve. Aortic valve regurgitation is not visualized. No aortic stenosis is present.  7. The inferior vena cava is dilated in size with <50% respiratory variability, suggesting right atrial pressure of 15 mmHg. Comparison(s): Changes from prior study are noted. LVEF has worsened from 30-35% to 20-25%, RV function is severely reduced and there is a large left pleural effusion. FINDINGS  Left Ventricle: No LV thrombus by Definity .  Left ventricular ejection fraction, by estimation, is 20 to 25%. The left ventricle has severely decreased function. The left ventricle demonstrates regional wall motion abnormalities. Strain was performed and  the global longitudinal strain is indeterminate. The left ventricular internal cavity size was severely dilated. There is moderate asymmetric left ventricular hypertrophy of the septal segment. Left ventricular diastolic function could not be evaluated due to nondiagnostic images. Left ventricular diastolic function could not be evaluated.  LV Wall Scoring: The entire anterior wall, mid and distal lateral wall, entire anterior septum, entire apex, entire inferior wall, posterior wall, mid anterolateral segment, and mid inferoseptal segment are akinetic. The basal anterolateral segment and basal inferoseptal segment are hypokinetic. Right Ventricle: The right ventricular size is mildly enlarged. No increase in right ventricular wall thickness. Right ventricular systolic function is severely reduced. Tricuspid regurgitation signal is inadequate for assessing PA pressure. Left Atrium: Left atrial size was mildly dilated. Right Atrium: Right atrial size was normal in size. Pericardium: There is no evidence of pericardial effusion. Mitral Valve: The mitral valve is normal in structure. Mild mitral valve regurgitation. No evidence of mitral valve stenosis. Tricuspid Valve: The tricuspid valve is normal in structure. Tricuspid valve regurgitation is not demonstrated. No evidence of tricuspid stenosis. Aortic Valve: The aortic valve is tricuspid. There is moderate calcification of the aortic valve. Aortic valve regurgitation is not visualized. No aortic stenosis is present. Pulmonic Valve: The pulmonic valve was grossly normal. Pulmonic valve regurgitation is trivial. No evidence of pulmonic stenosis. Aorta: The aortic root is normal in size and structure. Venous: The inferior vena cava is dilated in size with less  than 50% respiratory variability, suggesting right atrial pressure of 15 mmHg. IAS/Shunts: No atrial level shunt detected by color flow Doppler. Additional Comments: 3D was performed not requiring image post processing on an independent workstation and was indeterminate. A device lead is visualized. There is a large pleural effusion in the left lateral region.  LEFT VENTRICLE PLAX 2D LVIDd:         6.20 cm   Diastology LVIDs:         5.80 cm   LV e' medial:  2.36 cm/s LV PW:         1.10 cm   LV e' lateral: 7.65 cm/s LV IVS:        1.60 cm LVOT diam:     2.20 cm LV SV:         40 LV SV Index:   19 LVOT Area:     3.80 cm  RIGHT VENTRICLE             IVC RV S prime:     12.60 cm/s  IVC diam: 2.20 cm TAPSE (M-mode): 2.0 cm LEFT ATRIUM             Index        RIGHT ATRIUM           Index LA diam:  4.90 cm 2.28 cm/m   RA Area:     16.10 cm LA Vol (A2C):   80.0 ml 37.20 ml/m  RA Volume:   42.10 ml  19.58 ml/m LA Vol (A4C):   76.5 ml 35.57 ml/m LA Biplane Vol: 81.3 ml 37.80 ml/m  AORTIC VALVE LVOT Vmax:   67.60 cm/s LVOT Vmean:  43.000 cm/s LVOT VTI:    0.105 m  AORTA Ao Asc diam: 3.20 cm  SHUNTS Systemic VTI:  0.10 m Systemic Diam: 2.20 cm Vishnu Priya Mallipeddi Electronically signed by Lucetta Russel Mallipeddi Signature Date/Time: 03/26/2024/1:12:20 PM    Final     Cardiac Studies   Echo 03/26/2024  1. No LV thrombus by Definity . Left ventricular ejection fraction, by  estimation, is 20 to 25%. The left ventricle has severely decreased  function. The left ventricle demonstrates regional wall motion  abnormalities (see scoring diagram/findings for  description). The left ventricular internal cavity size was severely  dilated. There is moderate asymmetric left ventricular hypertrophy of the  septal segment. Left ventricular diastolic function could not be  evaluated.   2. Right ventricular systolic function is severely reduced. The right  ventricular size is mildly enlarged. Tricuspid  regurgitation signal is  inadequate for assessing PA pressure.   3. Left atrial size was mildly dilated.   4. Large pleural effusion in the left lateral region.   5. The mitral valve is normal in structure. Mild mitral valve  regurgitation. No evidence of mitral stenosis.   6. The aortic valve is tricuspid. There is moderate calcification of the  aortic valve. Aortic valve regurgitation is not visualized. No aortic  stenosis is present.   7. The inferior vena cava is dilated in size with <50% respiratory  variability, suggesting right atrial pressure of 15 mmHg.   Comparison(s): Changes from prior study are noted. LVEF has worsened from  30-35% to 20-25%, RV function is severely reduced and there is a large  left pleural effusion.    Patient Profile     68 y.o. 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   - Echo 03/26/2024 EF 20-25%, unable to assess PA pressure, large left pleural effusion, mild MR.   - PICC line placed. Started on IV lasix  gtt and midodrine . Home entresto  held  - Goal is to optimize his CHF condition prior to oncology treatment. He is aware he is likely high risk patient if a intraabdominal surgery is needed.   - I previously increased midodrine  to 10mg  TID, however SBP remain in the 80s. Currently on 10mg /hr dosing. Put out an additional liter of urine overnight, net I/O -4.5L  -Cr trended up slightly to 1.7, overall still at his baseline. However coox went down to 51, may need to transfer to ICU and do IV pressor + milrinone combination.   Ischemic cardiomyopathy s/p CRT-D  Multiple runs of NSVR:  unable to add BB due to soft blood pressure requiring midodrine . Started on 400mg  BID amiodarone , decrease to 200mg  daily after 1 week.   Multivessel CAD: no angina. Cath on 09/08/2016 and 10/11/2020 revealing severe triple-vessel CAD with no targets  for revascularization   Cholangiocarcinoma: diagnosed recently via liver biopsy. Follows by oncology service      For questions or updates, please contact [facility] Please consult [email or web address] for contact info under        Signed, Ervin Heath, PA  03/28/2024, 9:47 AM

## 2024-03-28 NOTE — TOC CM/SW Note (Signed)
 Transition of Care [facility]) - Inpatient Brief Assessment   Patient Details  Name: Eduardo [name] MRN: [id number] Date of Birth: 12-Feb-1956  Transition of Care [facility]) CM/SW Contact:    Eduardo Model, RN Phone Number: 03/28/2024, 10:34 AM   Clinical Narrative: From home with SO, has PCP and insurance on file, states has no HH services in place at this time , has walker, w/chair and rollator at home.  States SO Eduardo [name] will transport them home at [employer] and she is support system, states gets medications [facility] for [facility] and for short  term [facility] on [address].  Pta self ambulatory with wlker.  Has Right BKA and has a prosthertic.    Transition of Care Asessment: Insurance and Status: Insurance coverage has been reviewed Patient has primary care physician: Yes Home environment has been reviewed: lives with SO Prior level of function:: indep Prior/Current Home Services: Current home services (walker, w/chair, rollator) Social Drivers of Health Review: SDOH reviewed no interventions necessary Readmission risk has been reviewed: Yes Transition of care needs: no transition of care needs at this time

## 2024-03-29 ENCOUNTER — Encounter (HOSPITAL_COMMUNITY)
Admission: RE | Disposition: A | Discharge status: Home / Self Care | Payer: Self-pay | Source: Ambulatory Visit | Attending: Cardiology

## 2024-03-29 ENCOUNTER — Other Ambulatory Visit: Payer: Self-pay

## 2024-03-29 ENCOUNTER — Encounter (HOSPITAL_COMMUNITY)

## 2024-03-29 ENCOUNTER — Inpatient Hospital Stay (HOSPITAL_COMMUNITY)

## 2024-03-29 ENCOUNTER — Other Ambulatory Visit: Payer: Self-pay | Admitting: *Deleted

## 2024-03-29 ENCOUNTER — Encounter (HOSPITAL_COMMUNITY): Payer: Self-pay | Admitting: Cardiology

## 2024-03-29 DIAGNOSIS — N1832 Chronic kidney disease, stage 3b: Secondary | ICD-10-CM | POA: Diagnosis not present

## 2024-03-29 DIAGNOSIS — R160 Hepatomegaly, not elsewhere classified: Secondary | ICD-10-CM

## 2024-03-29 DIAGNOSIS — I5043 Acute on chronic combined systolic (congestive) and diastolic (congestive) heart failure: Secondary | ICD-10-CM | POA: Diagnosis not present

## 2024-03-29 DIAGNOSIS — R739 Hyperglycemia, unspecified: Secondary | ICD-10-CM | POA: Diagnosis not present

## 2024-03-29 DIAGNOSIS — R609 Edema, unspecified: Secondary | ICD-10-CM | POA: Diagnosis not present

## 2024-03-29 DIAGNOSIS — N1831 Chronic kidney disease, stage 3a: Secondary | ICD-10-CM | POA: Diagnosis not present

## 2024-03-29 DIAGNOSIS — R339 Retention of urine, unspecified: Secondary | ICD-10-CM

## 2024-03-29 HISTORY — PX: RIGHT HEART CATH: CATH118263

## 2024-03-29 LAB — COOXEMETRY PANEL
Carboxyhemoglobin: 1.1 % (ref 0.5–1.5)
Methemoglobin: <0.7 % (ref 0.0–1.5)
O2 Saturation: 62.5 %
Total hemoglobin: 12.5 g/dL (ref 12.0–16.0)

## 2024-03-29 LAB — BASIC METABOLIC PANEL WITH GFR
Anion gap: 15 (ref 5–15)
Anion gap: 9 (ref 5–15)
BUN: 45 mg/dL — ABNORMAL HIGH (ref 8–23)
BUN: 44 mg/dL — ABNORMAL HIGH (ref 8–23)
CO2: 22 mmol/L (ref 22–32)
CO2: 25 mmol/L (ref 22–32)
Calcium: 8.7 mg/dL — ABNORMAL LOW (ref 8.9–10.3)
Calcium: 8.4 mg/dL — ABNORMAL LOW (ref 8.9–10.3)
Chloride: 96 mmol/L — ABNORMAL LOW (ref 98–111)
Chloride: 95 mmol/L — ABNORMAL LOW (ref 98–111)
Creatinine, Ser: 1.79 mg/dL — ABNORMAL HIGH (ref 0.61–1.24)
Creatinine, Ser: 1.7 mg/dL — ABNORMAL HIGH (ref 0.61–1.24)
GFR, Estimated: 41 mL/min — ABNORMAL LOW (ref >60)
GFR, Estimated: 43 mL/min — ABNORMAL LOW (ref >60)
Glucose, Bld: 292 mg/dL — ABNORMAL HIGH (ref 70–99)
Glucose, Bld: 340 mg/dL — ABNORMAL HIGH (ref 70–99)
Potassium: 3.7 mmol/L (ref 3.5–5.1)
Potassium: 3.4 mmol/L — ABNORMAL LOW (ref 3.5–5.1)
Sodium: 133 mmol/L — ABNORMAL LOW (ref 135–145)
Sodium: 129 mmol/L — ABNORMAL LOW (ref 135–145)

## 2024-03-29 LAB — GLUCOSE, CAPILLARY
Glucose-Capillary: 170 mg/dL — ABNORMAL HIGH (ref 70–99)
Glucose-Capillary: 257 mg/dL — ABNORMAL HIGH (ref 70–99)
Glucose-Capillary: 153 mg/dL — ABNORMAL HIGH (ref 70–99)
Glucose-Capillary: 157 mg/dL — ABNORMAL HIGH (ref 70–99)

## 2024-03-29 LAB — POCT I-STAT EG7
Acid-Base Excess: 0 mmol/L (ref 0.0–2.0)
Acid-Base Excess: 1 mmol/L (ref 0.0–2.0)
Bicarbonate: 24.5 mmol/L (ref 20.0–28.0)
Bicarbonate: 24.8 mmol/L (ref 20.0–28.0)
Calcium, Ion: 1.17 mmol/L (ref 1.15–1.40)
Calcium, Ion: 1.17 mmol/L (ref 1.15–1.40)
HCT: 38 % — ABNORMAL LOW (ref 39.0–52.0)
HCT: 39 % (ref 39.0–52.0)
Hemoglobin: 12.9 g/dL — ABNORMAL LOW (ref 13.0–17.0)
Hemoglobin: 13.3 g/dL (ref 13.0–17.0)
O2 Saturation: 58 %
O2 Saturation: 58 %
Potassium: 3.9 mmol/L (ref 3.5–5.1)
Potassium: 3.9 mmol/L (ref 3.5–5.1)
Sodium: 136 mmol/L (ref 135–145)
Sodium: 137 mmol/L (ref 135–145)
TCO2: 26 mmol/L (ref 22–32)
TCO2: 26 mmol/L (ref 22–32)
pCO2, Ven: 37 mmHg — ABNORMAL LOW (ref 44–60)
pCO2, Ven: 37.8 mmHg — ABNORMAL LOW (ref 44–60)
pH, Ven: 7.425 (ref 7.25–7.43)
pH, Ven: 7.428 (ref 7.25–7.43)
pO2, Ven: 29 mmHg — CL (ref 32–45)
pO2, Ven: 29 mmHg — CL (ref 32–45)

## 2024-03-29 LAB — MAGNESIUM: Magnesium: 2.1 mg/dL (ref 1.7–2.4)

## 2024-03-29 SURGERY — RIGHT HEART CATH
Anesthesia: LOCAL

## 2024-03-29 MED ORDER — POTASSIUM CHLORIDE CRYS ER 20 MEQ PO TBCR
40.0000 meq | EXTENDED_RELEASE_TABLET | Freq: Once | ORAL | Status: AC
  Administered 2024-03-29: 40 meq via ORAL
  Filled 2024-03-29: qty 2

## 2024-03-29 MED ORDER — SODIUM CHLORIDE 0.9 % IV SOLN: INTRAVENOUS | Status: DC

## 2024-03-29 MED ORDER — TAMSULOSIN HCL 0.4 MG PO CAPS: 0.4000 mg | ORAL_CAPSULE | Freq: Every day | ORAL | Status: DC

## 2024-03-29 MED ORDER — NOREPINEPHRINE 4 MG/250ML-% IV SOLN
0.0000 ug/min | INTRAVENOUS | Status: DC
  Administered 2024-03-29: 5 ug/min via INTRAVENOUS
  Administered 2024-03-30: 15 ug/min via INTRAVENOUS
  Administered 2024-03-30: 10 ug/min via INTRAVENOUS
  Administered 2024-03-30: 12 ug/min via INTRAVENOUS
  Administered 2024-03-30: 11 ug/min via INTRAVENOUS
  Administered 2024-03-31 (×2): 7 ug/min via INTRAVENOUS
  Administered 2024-04-01: 11 ug/min via INTRAVENOUS
  Administered 2024-04-01: 14 ug/min via INTRAVENOUS
  Administered 2024-04-01: 6 ug/min via INTRAVENOUS
  Administered 2024-04-01: 14 ug/min via INTRAVENOUS
  Administered 2024-04-02: 2 ug/min via INTRAVENOUS
  Filled 2024-03-29 (×13): qty 250

## 2024-03-29 MED ORDER — NOREPINEPHRINE 4 MG/250ML-% IV SOLN
INTRAVENOUS | Status: AC
  Filled 2024-03-29: qty 250

## 2024-03-29 MED ORDER — INSULIN ASPART 100 UNIT/ML IJ SOLN
4.0000 [IU] | Freq: Three times a day (TID) | INTRAMUSCULAR | Status: DC
Start: 2024-03-29 — End: 2024-04-04
  Administered 2024-03-29 – 2024-04-04 (×18): 4 [IU] via SUBCUTANEOUS

## 2024-03-29 MED ORDER — DOBUTAMINE-DEXTROSE 4-5 MG/ML-% IV SOLN
3.0000 ug/kg/min | INTRAVENOUS | Status: AC
  Administered 2024-03-29: 5 ug/kg/min via INTRAVENOUS
  Filled 2024-03-29 (×2): qty 250

## 2024-03-29 MED ORDER — LIDOCAINE HCL (PF) 1 % IJ SOLN
INTRAMUSCULAR | Status: AC
  Filled 2024-03-29: qty 30

## 2024-03-29 MED ORDER — TAMSULOSIN HCL 0.4 MG PO CAPS
0.4000 mg | ORAL_CAPSULE | Freq: Every day | ORAL | Status: DC
  Administered 2024-03-29 – 2024-04-03 (×6): 0.4 mg via ORAL
  Filled 2024-03-29 (×6): qty 1

## 2024-03-29 MED ORDER — INSULIN ASPART 100 UNIT/ML IJ SOLN: 0.0000 [IU] | Freq: Three times a day (TID) | INTRAMUSCULAR | Status: DC

## 2024-03-29 MED ORDER — INSULIN ASPART 100 UNIT/ML IJ SOLN: 0.0000 [IU] | Freq: Every day | INTRAMUSCULAR | Status: DC

## 2024-03-29 MED ORDER — MELATONIN 5 MG PO TABS
10.0000 mg | ORAL_TABLET | Freq: Every evening | ORAL | Status: DC | PRN
  Administered 2024-03-29 – 2024-04-03 (×4): 10 mg via ORAL
  Filled 2024-03-29 (×4): qty 2

## 2024-03-29 MED ORDER — HEPARIN (PORCINE) IN NACL 1000-0.9 UT/500ML-% IV SOLN
INTRAVENOUS | Status: DC | PRN
  Administered 2024-03-29: 500 mL

## 2024-03-29 MED ORDER — LIDOCAINE HCL (PF) 1 % IJ SOLN
INTRAMUSCULAR | Status: DC | PRN
  Administered 2024-03-29: 5 mL via INTRADERMAL

## 2024-03-29 SURGICAL SUPPLY — 4 items
CATH SWAN GANZ 7F STRAIGHT (CATHETERS) IMPLANT
KIT MICROPUNCTURE NIT STIFF (SHEATH) IMPLANT
PACK CARDIAC CATHETERIZATION (CUSTOM PROCEDURE TRAY) ×1 IMPLANT
SHEATH PINNACLE 7F 10CM (SHEATH) IMPLANT

## 2024-03-29 NOTE — Progress Notes (Signed)
 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  Admitted to cardiology, PICC placed, lasix  gtt, and midodrine  started due to SBP 80-90's.  Pt reports his SBP runs on lower side from 80-100's on his home entresto  which is accurate on chart review.  Midodrine  increased 5/19.  Repeat echo showed EF now 20-25% down from 30-35% with severely reduced RV, large left pleural effusion, and mild MR.  Coox's have trended 61> 64 but today down to 51.  Pt denies any complaints of CP, SOB, orthopnea, headache, dizziness, N/V, fever, chills, or abd pain.  States does feel like his swelling is better but just slow progress.  Due to decrease in coox and lower BP's, pt transferred to ICU for probable pressors and intropes.   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, cholangiocarcinoma, small bowel resection for SBO 2023/11/22), diabetic retinopathy, diabetic neuropathy  Former smoker for 9yrs, 2ppd at heaviest- quit 2013 Rare ETOH  Significant Hospital Events: Including procedures, antibiotic start and stop dates in addition to other pertinent events   5/16 admit 5/17 PICC 5/20 tx to ICU for lower coox, NE/ aggressive diureses  Interim History / Subjective:  Still feels fatigued and did not sleep well but no other  complaints CVP 9 Diuresed 2.2L/ 24 Remains on lasix  20mg / hr NE 8  Objective    Blood pressure (!) 87/54, pulse 70, temperature 98.1 F (36.7 C), temperature source Oral, resp. rate (!) 27, height 5\' 9"  (1.753 m), weight 98.6 kg, SpO2 95%. CVP:  [3 mmHg-34 mmHg] 23 mmHg      Intake/Output Summary (Last 24 hours) at 03/29/2024 0924 Last data filed at 03/29/2024 4098 Gross per 24 hour  Intake 825.09 ml  Output 2175 ml  Net -1349.91 ml   Filed Weights   03/26/24 0355 03/28/24 0543 03/29/24 0455  Weight: 99.7 kg 99.9 kg 98.6 kg    Examination: General:  Older male sitting in bedside chair in NAD HEENT: MM pink/moist Neuro: Aox4, MAE CV: vpaced PULM:  non labored, clear anteriorly, diminished left basilar GI: soft, bs+, NT, voids Extremities: warm/dry, R BKA, LLE 1-2+ edema  Skin: no rashes   afebrile UOP 2.2L/ 24hrs -883ml/ 24hrs Wts 101.8 on admit, 99.9> 98.6 Labs reviewed> renal function stable, K 3.7   Resolved problem list   Assessment and Plan    Acute on chronic biventricular heart failure with volume overload  Multivessel CAD HTN- although runs chronically SBP range 80-90's on EMR review back to November 22, 2023 - echo 5/18> EF 20-25%, mild MR, RV severely reduced P:  - per AHF team - cont to trend CVP/ coox  - NE for goal MAP > 65 - lasix  gtt per AHF - plans for right heart cath today  - strict I/Os, trending renal indices - plavix , ezetimibe , gemfibrozil , rosuvastatin  - hypotension limites GDMT, home entresto  held  -  pending LE dopplers to r/o DVT given LE edema and malignancy hx   NSVT - tele monitoring - amiodarone  400mg  BID - optimize electrolytes   Bilateral pleural effusions in setting of volume overload - diurese as above - CXR prn - IS/ mobilize as tolerated   CKD 3a - baseline 1.3-1.5 - sCr stable - lasix  gtt as above - trend renal indices  - strict I/Os, daily wts - avoid nephrotoxins, renal dose meds, hemodynamic support as  above   T2DM - CBG q4, prn AC/ HS.  Prefers to keep his home NPH   Cholangiocarcinoma - dx after recent biopsy 03/16/24, followed by Dr. Scherrie Curt, treatment course pending cardiac optimization and if surgery candidate, consider PMT consult   GOC - per pt, no CPR or longterm life support, ok with short term intubation if needed.  Emergency contact- SO> Ammon Bales McDermott who is also his HPOA per pt   Best Practice (right click and "Reselect all SmartList Selections" daily)   Diet/type: Regular consistency (see orders); NPO pending RHC DVT prophylaxis prophylactic heparin   Pressure ulcer(s): N/A GI prophylaxis: N/A Lines: RUE PICC 5/17 Foley:  N/A Code Status:  limited, no CPR Last date of multidisciplinary goals of care discussion [5/20]  Labs   CBC: Recent Labs  Lab 03/24/24 2234  WBC 5.6  HGB 10.9*  HCT 33.7*  MCV 83.0  PLT 180    Basic Metabolic Panel: Recent Labs  Lab 03/25/24 0222 03/26/24 0529 03/27/24 0603 03/28/24 0454 03/29/24 0502  NA 135 137 136 136 133*  K 3.4* 3.2* 3.3* 4.3 3.7  CL 103 105 102 103 96*  CO2 21* 22 24 24 22   GLUCOSE 95 160* 151* 156* 292*  BUN 47* 43* 41* 44* 45*  CREATININE 1.54* 1.49* 1.63* 1.73* 1.79*  CALCIUM  8.5* 8.4* 8.4* 8.8* 8.7*  MG  --   --   --   --  2.1   GFR: Estimated Creatinine Clearance: 45.8 mL/min (A) (by C-G formula based on SCr of 1.79 mg/dL (H)). Recent Labs  Lab 03/24/24 2234 03/25/24 1008  WBC 5.6  --   LATICACIDVEN  --  0.9    Liver Function Tests: Recent Labs  Lab 03/24/24 2234 03/26/24 1301  AST 21 24  ALT 12 8  ALKPHOS 61 64  BILITOT 0.8 0.9  PROT 5.2* 5.7*  ALBUMIN  2.9* 2.8*   No results for input(s): "LIPASE", "AMYLASE" in the last 168 hours. No results for input(s): "AMMONIA" in the last 168 hours.  ABG    Component Value Date/Time   PHART 7.267 (L) 10/11/2020 0918   PCO2ART 54.1 (H) 10/11/2020 0918   PO2ART 105 10/11/2020 0918   HCO3 24.7 10/11/2020 0918   TCO2 25 11/13/2023  2241   ACIDBASEDEF 3.0 (H) 10/11/2020 0918   O2SAT 62.5 03/29/2024 0502     Coagulation Profile: No results for input(s): "INR", "PROTIME" in the last 168 hours.  Cardiac Enzymes: No results for input(s): "CKTOTAL", "CKMB", "CKMBINDEX", "TROPONINI" in the last 168 hours.  HbA1C: Hemoglobin A1C  Date/Time Value Ref Range Status  10/19/2023 12:00 AM 6.6  Final  04/22/2023 12:00 AM 6.9  Final   Hgb A1c MFr Bld  Date/Time Value Ref Range Status  03/24/2024 10:39 PM 6.7 (H) 4.8 - 5.6 % Final    Comment:    (NOTE) Pre diabetes:          5.7%-6.4%  Diabetes:              >6.4%  Glycemic  control for   <7.0% adults with diabetes     CBG: Recent Labs  Lab 03/28/24 1133 03/28/24 1137 03/28/24 1733 03/28/24 2110 03/29/24 0609  GLUCAP 223* 188* 148* 147* 170*    Allergies Allergies  Allergen Reactions   Empagliflozin      Other Reaction(s): foot infection   Shellfish-Derived Products Nausea And Vomiting and Other (See Comments)    Only Mussels cause severe nausea and vomiting    Latex Rash   Tape Rash and Other (See Comments)    Caused issues with the skin   Testosterone  Rash    did not feel well      Home Medications  Prior to Admission medications   Medication Sig Start Date End Date Taking? Authorizing Provider  acetaminophen  (TYLENOL ) 325 MG tablet Take 1-2 tablets (325-650 mg total) by mouth every 4 (four) hours as needed for mild pain. 03/31/22  Yes Setzer, Sandra J, PA-C  Bioflavonoid Products (ESTER C PO) Take 1,000 mg by mouth daily.   Yes [provider]  bumetanide  (BUMEX ) 2 MG tablet Take 1 tablet (2 mg total) by mouth 2 (two) times daily. 03/21/24  Yes Patwardhan, Manish J, MD  Cholecalciferol  (VITAMIN D ) 2000 units CAPS Take 2,000 Units by mouth daily.   Yes [provider]  clopidogrel  (PLAVIX ) 75 MG tablet Take 1 tablet (75 mg total) by mouth daily. 12/02/23  Yes Patwardhan, Kaye Parsons, MD  Continuous Glucose Sensor (FREESTYLE LIBRE 2  PLUS SENSOR) MISC 1 Application. 03/20/24  Yes [provider]  ezetimibe  (ZETIA ) 10 MG tablet TAKE 1 TABLET BY MOUTH DAILY 07/08/23  Yes Patwardhan, Manish J, MD  gemfibrozil  (LOPID ) 600 MG tablet Take 600 mg by mouth 2 (two) times daily before a meal.   Yes [provider]  HUMALOG KWIKPEN 100 UNIT/ML KiwkPen Inject 3-10 Units into the skin 3 (three) times daily. Inject 01-1009 units into the skin three times a day, per sliding scale- based on BGL >100 08/02/16  Yes [provider]  HUMULIN  N KWIKPEN 100 UNIT/ML KwikPen Inject 6 Units into the skin daily. 03/20/24  Yes [provider]  Oksana Bergamo Oil 300 MG CAPS Take 500 mg by mouth daily.   Yes [provider]  lidocaine  (XYLOCAINE ) 5 % ointment Apply 1 Application topically as needed. For pain as needed- can use up to 4x/day 01/03/24  Yes Lovorn, Megan, MD  melatonin 10 MG TABS Take 10 mg by mouth at bedtime. 03/31/22  Yes Setzer, Sandra J, PA-C  Multiple Vitamin (MULTIVITAMIN WITH MINERALS) TABS tablet Take 1 tablet by mouth daily.   Yes [provider]  nitroGLYCERIN  (NITROSTAT ) 0.4 MG SL tablet Place 1 tablet (0.4 mg total) under the tongue every 5 (five) minutes as needed for chest pain. 05/08/21  Yes Cantwell, Celeste C, PA-C  Probiotic Product (PROBIOTIC-10 PO) Take 1 capsule by mouth daily.   Yes [provider]  rosuvastatin  (CRESTOR ) 20 MG tablet Take 1 tablet (20 mg total) by mouth daily. 07/22/20  Yes Knox Perl, MD  sacubitril -valsartan  (ENTRESTO ) 24-26 MG TAKE 1 TABLET BY MOUTH TWICE  DAILY 10/11/23  Yes Patwardhan, Manish J, MD  sodium chloride  (OCEAN) 0.65 % SOLN nasal spray Place 1 spray into both nostrils as needed for congestion.   Yes [provider]  tirzepatide Florence Hunt) 12.5 MG/0.5ML Pen Inject 12.5 mg into the skin once a week.   Yes [provider]     Critical care time: 36 mins       Brooke  Rodolph Clap, MSN, AG-ACNP-BC [facility] 03/29/2024, 9:24 AM  See Amion for pager If no response to pager , please call [phone] until 7pm After 7:00 pm call Elink  [phone]

## 2024-03-29 NOTE — Progress Notes (Signed)
 Orthopedic Tech Progress Note Patient Details:  Eduardo [name] 1956/01/02 [id number]  Ortho Devices Type of Ortho Device: Achilles Holes boot Ortho Device/Splint Location: LLE Ortho Device/Splint Interventions: Ordered, Application   Post Interventions Patient Tolerated: Well  Delynn Fill 03/29/2024, 4:35 PM

## 2024-03-29 NOTE — Interval H&P Note (Signed)
 History and Physical Interval Note:  03/29/2024 12:43 PM  Eduardo [name]  has presented today for surgery, with the diagnosis of heart failure.  The various methods of treatment have been discussed with the patient and family. After consideration of risks, benefits and other options for treatment, the patient has consented to  Procedure(s): RIGHT HEART CATH (N/A) as a surgical intervention.  The patient's history has been reviewed, patient examined, no change in status, stable for surgery.  I have reviewed the patient's chart and labs.  Questions were answered to the patient's satisfaction.     Lauralee Poll

## 2024-03-29 NOTE — Progress Notes (Signed)
 Advanced Heart Failure Rounding Note  Cardiologist: Cody Das, MD  Chief Complaint: acute on chronic heart failure    Patient Profile   Patient is a 68 year old gentleman with a past medical history of chronic systolic heart failure due to CAD, prior R BKA, CKD who presented for scheduled admission for IV diuresis given recent diagnosis of cholangiocarcinoma.    He was admitted to the floor, but had poor response to escalating doses of IV diuretics. PICC was placed and showed borderline co-ox. Further diuresis limited by hypotension.  Transferred to 2H for initiation of NE support and AHF team consultation.   Subjective:    OOB sitting up in chair. No current resting dyspnea but c/w LEE.   Only 2.2L in UOP despite being on lasix  gtt at 30/hr. BP stable on NE, currently at 8 mcg/min. Co-ox 63%. CVP 9. SCr uncharged from yesterday, 1.79.    Objective:   Weight Range: 98.6 kg Body mass index is 32.1 kg/m.   Vital Signs:   Temp:  [97.6 F (36.4 C)-98.1 F (36.7 C)] 98.1 F (36.7 C) (05/21 0832) Pulse Rate:  [64-99] 84 (05/21 1015) Resp:  [16-38] 27 (05/20 1615) BP: (87-111)/(52-92) 102/66 (05/21 1015) SpO2:  [83 %-98 %] 94 % (05/21 1015) Weight:  [98.6 kg] 98.6 kg (05/21 0940) Last BM Date : 03/29/24  Weight change: Filed Weights   03/28/24 0543 03/29/24 0455 03/29/24 0940  Weight: 99.9 kg 98.6 kg 98.6 kg    Intake/Output:   Intake/Output Summary (Last 24 hours) at 03/29/2024 1053 Last data filed at 03/29/2024 0901 Gross per 24 hour  Intake 897.94 ml  Output 2175 ml  Net -1277.06 ml      Physical Exam    General:  fatigued appearing. No resp difficulty HEENT: Normal Neck: Supple. JVP 9 cm . Carotids 2+ bilat; no bruits. No lymphadenopathy or thyromegaly appreciated. Cor: PMI nondisplaced. Regular rate & rhythm. No rubs, gallops or murmurs. Lungs: decreases BS at the bases bilaterally  Abdomen: Soft, nontender, nondistended. No hepatosplenomegaly.  No bruits or masses. Good bowel sounds. Extremities: No cyanosis, clubbing, rash, s/p rt LE prosthetic leg, trace-1 + LLE edema Neuro: Alert & orientedx3, cranial nerves grossly intact. moves all 4 extremities w/o difficulty. Affect pleasant   Telemetry   NSR 80s, personally reviewed   EKG    N/A   Labs    CBC No results for input(s): "WBC", "NEUTROABS", "HGB", "HCT", "MCV", "PLT" in the last 72 hours. Basic Metabolic Panel Recent Labs    16/10/96 0454 03/29/24 0502  NA 136 133*  K 4.3 3.7  CL 103 96*  CO2 24 22  GLUCOSE 156* 292*  BUN 44* 45*  CREATININE 1.73* 1.79*  CALCIUM  8.8* 8.7*  MG  --  2.1   Liver Function Tests Recent Labs    03/26/24 1301  AST 24  ALT 8  ALKPHOS 64  BILITOT 0.9  PROT 5.7*  ALBUMIN  2.8*   No results for input(s): "LIPASE", "AMYLASE" in the last 72 hours. Cardiac Enzymes No results for input(s): "CKTOTAL", "CKMB", "CKMBINDEX", "TROPONINI" in the last 72 hours.  BNP: BNP (last 3 results) Recent Labs    11/14/23 0229 03/24/24 2239  BNP 1,274.6* 4,192.8*    ProBNP (last 3 results) No results for input(s): "PROBNP" in the last 8760 hours.   D-Dimer No results for input(s): "DDIMER" in the last 72 hours. Hemoglobin A1C No results for input(s): "HGBA1C" in the last 72 hours. Fasting Lipid Panel  No results for input(s): "CHOL", "HDL", "LDLCALC", "TRIG", "CHOLHDL", "LDLDIRECT" in the last 72 hours. Thyroid  Function Tests No results for input(s): "TSH", "T4TOTAL", "T3FREE", "THYROIDAB" in the last 72 hours.  Invalid input(s): "FREET3"  Other results:   Imaging    No results found.   Medications:     Scheduled Medications:  amiodarone   400 mg Oral BID   Chlorhexidine  Gluconate Cloth  6 each Topical Daily   cholecalciferol   2,000 Units Oral Daily   clopidogrel   75 mg Oral Daily   ezetimibe   10 mg Oral Daily   guaiFENesin   600 mg Oral BID   insulin  aspart  0-20 Units Subcutaneous TID WC   insulin  aspart  0-5  Units Subcutaneous QHS   insulin  NPH Human  6 Units Subcutaneous QAC breakfast   multivitamin with minerals  1 tablet Oral Daily   polyethylene glycol  17 g Oral Daily   rosuvastatin   20 mg Oral Daily   sodium chloride  flush  10-40 mL Intracatheter Q12H   sodium chloride  flush  3 mL Intravenous Q12H    Infusions:  [START ON 03/30/2024] sodium chloride      furosemide  (LASIX ) 200 mg in dextrose  5 % 100 mL (2 mg/mL) infusion 20 mg/hr (03/29/24 0947)   norepinephrine  (LEVOPHED ) Adult infusion 8 mcg/min (03/29/24 0949)    PRN Medications: acetaminophen , melatonin, ondansetron  (ZOFRAN ) IV, sodium chloride  flush, sodium chloride  flush    Assessment/Plan   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 Longstanding history of severely reduced EF <20-25% since 2021 with diastolic dysfunction and relatively ok RV. - Has CRT-D - Echo this admission with EF 20-25% and new severely reduced RV, mild MR - Admitted from cardiology clinic for IV diuresis. Has had minimal response. - now on Lasix  gtt at 30/hr + NE support at 8 mcg/min. Co-ox 63% but urinary response not as expected given dose of lasix  gtt and he c/w evidence of volume overload on exam. CVP 9. + peripheral edema  - Plan RHC today to further evaluate. D/w pt and he agrees to proceed.  - Continue NE at fixed rate, 8 mcg/min - Continue Lasix  gtt at 30/hr  - GDMT limited by hypotension and renal function - Prev worked up for VAD in 2022, did not want to pursue. At this point would not be a candidate for advanced therapies. Will continue to optimize medically.   2. CAD - severe native three-vessel coronary artery disease (LAD 100%p LCX all marginals occluded< RCA diffuse distal 99%  - denies CP  - continue crestor , plavix , gemfibriozil, and ezetimibe    3. NSVT  - continue amio 400 mg bid   4. AKI on CKD 3b - b/l Cr 1.3-1.5 - sCr up to 1.79 today - continue diuresis and BP support as above - RHC today    5.  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, [place]  03/29/2024, 10:53 AM  Advanced Heart Failure Team Pager [phone] (M-F; 7a - 5p)  Please contact [facility] for night-coverage after hours (5p -7a ) and weekends on [email or web address]

## 2024-03-29 NOTE — Plan of Care (Signed)
 Problem: Education: Goal: Knowledge of General Education information will improve Description: Including pain rating scale, medication(s)/side effects and non-pharmacologic comfort measures Outcome: Progressing   Problem: Health Behavior/Discharge Planning: Goal: Ability to manage health-related needs will improve Outcome: Progressing   Problem: Clinical Measurements: Goal: Ability to maintain clinical measurements within normal limits will improve Outcome: Progressing Goal: Will remain free from infection Outcome: Progressing Goal: Diagnostic test results will improve Outcome: Progressing Goal: Respiratory complications will improve Outcome: Progressing Goal: Cardiovascular complication will be avoided Outcome: Progressing   Problem: Activity: Goal: Risk for activity intolerance will decrease Outcome: Progressing   Problem: Nutrition: Goal: Adequate nutrition will be maintained Outcome: Progressing   Problem: Coping: Goal: Level of anxiety will decrease Outcome: Progressing   Problem: Elimination: Goal: Will not experience complications related to bowel motility Outcome: Progressing Goal: Will not experience complications related to urinary retention Outcome: Progressing   Problem: Pain Managment: Goal: General experience of comfort will improve and/or be controlled Outcome: Progressing   Problem: Safety: Goal: Ability to remain free from injury will improve Outcome: Progressing   Problem: Skin Integrity: Goal: Risk for impaired skin integrity will decrease Outcome: Progressing   Problem: Education: Goal: Ability to describe self-care measures that may prevent or decrease complications (Diabetes Survival Skills Education) will improve Outcome: Progressing   Problem: Coping: Goal: Ability to adjust to condition or change in health will improve Outcome: Progressing   Problem: Fluid Volume: Goal: Ability to maintain a balanced intake and output will  improve Outcome: Progressing   Problem: Health Behavior/Discharge Planning: Goal: Ability to identify and utilize available resources and services will improve Outcome: Progressing Goal: Ability to manage health-related needs will improve Outcome: Progressing   Problem: Metabolic: Goal: Ability to maintain appropriate glucose levels will improve Outcome: Progressing   Problem: Nutritional: Goal: Maintenance of adequate nutrition will improve Outcome: Progressing Goal: Progress toward achieving an optimal weight will improve Outcome: Progressing   Problem: Skin Integrity: Goal: Risk for impaired skin integrity will decrease Outcome: Progressing   Problem: Tissue Perfusion: Goal: Adequacy of tissue perfusion will improve Outcome: Progressing   Problem: Education: Goal: Ability to demonstrate management of disease process will improve Outcome: Progressing Goal: Ability to verbalize understanding of medication therapies will improve Outcome: Progressing   Problem: Activity: Goal: Capacity to carry out activities will improve Outcome: Progressing   Problem: Cardiac: Goal: Ability to achieve and maintain adequate cardiopulmonary perfusion will improve Outcome: Progressing   Problem: Education: Goal: Ability to describe self-care measures that may prevent or decrease complications (Diabetes Survival Skills Education) will improve Outcome: Progressing Goal: Individualized Educational Video(s) Outcome: Progressing   Problem: Coping: Goal: Ability to adjust to condition or change in health will improve Outcome: Progressing   Problem: Fluid Volume: Goal: Ability to maintain a balanced intake and output will improve Outcome: Progressing   Problem: Health Behavior/Discharge Planning: Goal: Ability to identify and utilize available resources and services will improve Outcome: Progressing Goal: Ability to manage health-related needs will improve Outcome: Progressing    Problem: Metabolic: Goal: Ability to maintain appropriate glucose levels will improve Outcome: Progressing   Problem: Nutritional: Goal: Maintenance of adequate nutrition will improve Outcome: Progressing Goal: Progress toward achieving an optimal weight will improve Outcome: Progressing   Problem: Skin Integrity: Goal: Risk for impaired skin integrity will decrease Outcome: Progressing   Problem: Tissue Perfusion: Goal: Adequacy of tissue perfusion will improve Outcome: Progressing   Problem: Education: Goal: Understanding of CV disease, CV risk reduction, and recovery process will improve Outcome: Progressing  Problem: Activity: Goal: Ability to return to baseline activity level will improve Outcome: Progressing   Problem: Cardiovascular: Goal: Ability to achieve and maintain adequate cardiovascular perfusion will improve Outcome: Progressing Goal: Vascular access site(s) Level 0-1 will be maintained Outcome: Progressing   Problem: Health Behavior/Discharge Planning: Goal: Ability to safely manage health-related needs after discharge will improve Outcome: Progressing

## 2024-03-30 ENCOUNTER — Ambulatory Visit (HOSPITAL_COMMUNITY): Admission: RE | Admit: 2024-03-30 | Source: Ambulatory Visit

## 2024-03-30 DIAGNOSIS — I5043 Acute on chronic combined systolic (congestive) and diastolic (congestive) heart failure: Secondary | ICD-10-CM | POA: Diagnosis not present

## 2024-03-30 LAB — CBC
HCT: 34.1 % — ABNORMAL LOW (ref 39.0–52.0)
Hemoglobin: 11.2 g/dL — ABNORMAL LOW (ref 13.0–17.0)
MCH: 27.3 pg (ref 26.0–34.0)
MCHC: 32.8 g/dL (ref 30.0–36.0)
MCV: 83.2 fL (ref 80.0–100.0)
Platelets: 181 10*3/uL (ref 150–400)
RBC: 4.1 MIL/uL — ABNORMAL LOW (ref 4.22–5.81)
RDW: 19.8 % — ABNORMAL HIGH (ref 11.5–15.5)
WBC: 6.5 10*3/uL (ref 4.0–10.5)
nRBC: 0 % (ref 0.0–0.2)

## 2024-03-30 LAB — MAGNESIUM: Magnesium: 2.1 mg/dL (ref 1.7–2.4)

## 2024-03-30 LAB — BASIC METABOLIC PANEL WITH GFR
Anion gap: 11 (ref 5–15)
BUN: 42 mg/dL — ABNORMAL HIGH (ref 8–23)
CO2: 24 mmol/L (ref 22–32)
Calcium: 8.3 mg/dL — ABNORMAL LOW (ref 8.9–10.3)
Chloride: 94 mmol/L — ABNORMAL LOW (ref 98–111)
Creatinine, Ser: 1.75 mg/dL — ABNORMAL HIGH (ref 0.61–1.24)
GFR, Estimated: 42 mL/min — ABNORMAL LOW (ref >60)
Glucose, Bld: 349 mg/dL — ABNORMAL HIGH (ref 70–99)
Potassium: 3.6 mmol/L (ref 3.5–5.1)
Sodium: 129 mmol/L — ABNORMAL LOW (ref 135–145)

## 2024-03-30 LAB — COOXEMETRY PANEL
Carboxyhemoglobin: 0.7 % (ref 0.5–1.5)
Methemoglobin: <0.7 % (ref 0.0–1.5)
O2 Saturation: 66.3 %
Total hemoglobin: 11.3 g/dL — ABNORMAL LOW (ref 12.0–16.0)

## 2024-03-30 LAB — GLUCOSE, CAPILLARY
Glucose-Capillary: 177 mg/dL — ABNORMAL HIGH (ref 70–99)
Glucose-Capillary: 276 mg/dL — ABNORMAL HIGH (ref 70–99)
Glucose-Capillary: 214 mg/dL — ABNORMAL HIGH (ref 70–99)
Glucose-Capillary: 137 mg/dL — ABNORMAL HIGH (ref 70–99)

## 2024-03-30 MED ORDER — ACETAZOLAMIDE 250 MG PO TABS
500.0000 mg | ORAL_TABLET | Freq: Two times a day (BID) | ORAL | Status: AC
  Administered 2024-03-30 – 2024-03-31 (×4): 500 mg via ORAL
  Filled 2024-03-30 (×4): qty 2

## 2024-03-30 MED ORDER — SODIUM CHLORIDE 0.9 % IV SOLN
500.0000 mg | Freq: Once | INTRAVENOUS | Status: AC
  Administered 2024-03-30: 500 mg via INTRAVENOUS
  Filled 2024-03-30: qty 25

## 2024-03-30 MED ORDER — POTASSIUM CHLORIDE CRYS ER 20 MEQ PO TBCR
40.0000 meq | EXTENDED_RELEASE_TABLET | Freq: Once | ORAL | Status: AC
  Administered 2024-03-30: 40 meq via ORAL
  Filled 2024-03-30: qty 2

## 2024-03-30 MED ORDER — POTASSIUM CHLORIDE CRYS ER 20 MEQ PO TBCR
60.0000 meq | EXTENDED_RELEASE_TABLET | Freq: Once | ORAL | Status: AC
  Administered 2024-03-30: 60 meq via ORAL
  Filled 2024-03-30: qty 3

## 2024-03-30 MED ORDER — ENOXAPARIN SODIUM 40 MG/0.4ML IJ SOSY
40.0000 mg | PREFILLED_SYRINGE | INTRAMUSCULAR | Status: DC
  Administered 2024-03-30 – 2024-04-03 (×5): 40 mg via SUBCUTANEOUS
  Filled 2024-03-30 (×6): qty 0.4

## 2024-03-30 MED ORDER — IRON SUCROSE 500 MG IVPB - SIMPLE MED
500.0000 mg | Freq: Once | INTRAVENOUS | Status: DC
Start: 2024-03-30 — End: 2024-03-30
  Filled 2024-03-30: qty 275

## 2024-03-30 NOTE — Plan of Care (Signed)
 Problem: Education: Goal: Knowledge of General Education information will improve Description: Including pain rating scale, medication(s)/side effects and non-pharmacologic comfort measures Outcome: Progressing   Problem: Health Behavior/Discharge Planning: Goal: Ability to manage health-related needs will improve Outcome: Progressing   Problem: Clinical Measurements: Goal: Ability to maintain clinical measurements within normal limits will improve Outcome: Progressing Goal: Will remain free from infection Outcome: Progressing Goal: Diagnostic test results will improve Outcome: Progressing Goal: Respiratory complications will improve Outcome: Progressing Goal: Cardiovascular complication will be avoided Outcome: Progressing   Problem: Activity: Goal: Risk for activity intolerance will decrease Outcome: Progressing   Problem: Nutrition: Goal: Adequate nutrition will be maintained Outcome: Progressing   Problem: Coping: Goal: Level of anxiety will decrease Outcome: Progressing   Problem: Elimination: Goal: Will not experience complications related to bowel motility Outcome: Progressing Goal: Will not experience complications related to urinary retention Outcome: Progressing   Problem: Pain Managment: Goal: General experience of comfort will improve and/or be controlled Outcome: Progressing   Problem: Safety: Goal: Ability to remain free from injury will improve Outcome: Progressing   Problem: Skin Integrity: Goal: Risk for impaired skin integrity will decrease Outcome: Progressing   Problem: Coping: Goal: Ability to adjust to condition or change in health will improve Outcome: Progressing   Problem: Fluid Volume: Goal: Ability to maintain a balanced intake and output will improve Outcome: Progressing   Problem: Health Behavior/Discharge Planning: Goal: Ability to identify and utilize available resources and services will improve Outcome: Progressing Goal:  Ability to manage health-related needs will improve Outcome: Progressing   Problem: Metabolic: Goal: Ability to maintain appropriate glucose levels will improve Outcome: Progressing   Problem: Nutritional: Goal: Maintenance of adequate nutrition will improve Outcome: Progressing Goal: Progress toward achieving an optimal weight will improve Outcome: Progressing   Problem: Skin Integrity: Goal: Risk for impaired skin integrity will decrease Outcome: Progressing   Problem: Tissue Perfusion: Goal: Adequacy of tissue perfusion will improve Outcome: Progressing   Problem: Education: Goal: Ability to demonstrate management of disease process will improve Outcome: Progressing Goal: Ability to verbalize understanding of medication therapies will improve Outcome: Progressing Goal: Individualized Educational Video(s) Outcome: Progressing   Problem: Activity: Goal: Capacity to carry out activities will improve Outcome: Progressing   Problem: Cardiac: Goal: Ability to achieve and maintain adequate cardiopulmonary perfusion will improve Outcome: Progressing   Problem: Education: Goal: Ability to describe self-care measures that may prevent or decrease complications (Diabetes Survival Skills Education) will improve Outcome: Progressing Goal: Individualized Educational Video(s) Outcome: Progressing   Problem: Coping: Goal: Ability to adjust to condition or change in health will improve Outcome: Progressing   Problem: Fluid Volume: Goal: Ability to maintain a balanced intake and output will improve Outcome: Progressing   Problem: Health Behavior/Discharge Planning: Goal: Ability to identify and utilize available resources and services will improve Outcome: Progressing Goal: Ability to manage health-related needs will improve Outcome: Progressing   Problem: Metabolic: Goal: Ability to maintain appropriate glucose levels will improve Outcome: Progressing   Problem:  Nutritional: Goal: Maintenance of adequate nutrition will improve Outcome: Progressing Goal: Progress toward achieving an optimal weight will improve Outcome: Progressing   Problem: Skin Integrity: Goal: Risk for impaired skin integrity will decrease Outcome: Progressing   Problem: Tissue Perfusion: Goal: Adequacy of tissue perfusion will improve Outcome: Progressing   Problem: Education: Goal: Understanding of CV disease, CV risk reduction, and recovery process will improve Outcome: Progressing   Problem: Activity: Goal: Ability to return to baseline activity level will improve Outcome: Progressing  Problem: Cardiovascular: Goal: Ability to achieve and maintain adequate cardiovascular perfusion will improve Outcome: Progressing Goal: Vascular access site(s) Level 0-1 will be maintained Outcome: Progressing   Problem: Health Behavior/Discharge Planning: Goal: Ability to safely manage health-related needs after discharge will improve Outcome: Progressing

## 2024-03-30 NOTE — Progress Notes (Signed)
 IP PROGRESS NOTE  Subjective:   Eduardo [name] was recently diagnosed with an intrahepatic cholangiocarcinoma.  He is now admitted with decompensated heart failure.  He is followed by cardiology and critical care medicine.  He is currently maintained on a furosemide  infusion with Levophed  and dobutamine support.  Eduardo [name] reports improvement in edema.  Objective: Vital signs in last 24 hours: Blood pressure 98/63, pulse 74, temperature 98.4 F (36.9 C), temperature source Oral, resp. rate 14, height 5\' 9"  (1.753 m), weight 217 lb 6 oz (98.6 kg), SpO2 96%.  Intake/Output from previous day: 05/21 0701 - 05/22 0700 In: 1541.7 [P.O.:437; I.V.:1104.7] Out: 3650 [Urine:3650]  Physical Exam: Lungs: Decreased breath sounds at the left lower posterior chest, no respiratory distress Cardiac: Regular rhythm with premature beats Abdomen: Nontender, no mass, no hepatosplenomegaly Extremities: trace leg edema, no appreciable abdominal wall or presacral edema   Portacath/PICC-without erythema  Lab Results: Recent Labs    03/29/24 1259 03/30/24 0508  WBC  --  6.5  HGB 13.3  12.9* 11.2*  HCT 39.0  38.0* 34.1*  PLT  --  181    BMET Recent Labs    03/29/24 2308 03/30/24 0508  NA 129* 129*  K 3.4* 3.6  CL 95* 94*  CO2 25 24  GLUCOSE 340* 349*  BUN 44* 42*  CREATININE 1.70* 1.75*  CALCIUM  8.4* 8.3*    No results found for: "CEA1", "CEA", "ZOX096", "CA125"  Studies/Results: VAS US  LOWER EXTREMITY VENOUS (DVT) Result Date: 03/29/2024  Lower Venous DVT Study Patient Name:  EARLEY GROBE  Date of Exam:   03/29/2024 Medical Rec #: [id number]     Accession #:    [id number] Date of Birth: [birth year]    Patient Gender: M Patient Age:   68 years Exam Location:  [facility] Procedure:      VAS US  LOWER EXTREMITY VENOUS (DVT) Referring Phys: Jaquita Merl --------------------------------------------------------------------------------  Indications: Edema.  Risk Factors: Surgery RT BKA.  Performing Technologist: Aloma Arrant RVS  Examination Guidelines: A complete evaluation includes B-mode imaging, spectral Doppler, color Doppler, and power Doppler as needed of all accessible portions of each vessel. Bilateral testing is considered an integral part of a complete examination. Limited examinations for reoccurring indications may be performed as noted. The reflux portion of the exam is performed with the patient in reverse Trendelenburg.  +---------+---------------+---------+-----------+----------+--------------+ RIGHT    CompressibilityPhasicitySpontaneityPropertiesThrombus Aging +---------+---------------+---------+-----------+----------+--------------+ CFV      Full           Yes      Yes                                 +---------+---------------+---------+-----------+----------+--------------+ SFJ      Full                                                        +---------+---------------+---------+-----------+----------+--------------+ FV Prox  Full                                                        +---------+---------------+---------+-----------+----------+--------------+ FV Mid   Full                                                        +---------+---------------+---------+-----------+----------+--------------+  FV DistalFull                                                        +---------+---------------+---------+-----------+----------+--------------+ PFV      Full                                                        +---------+---------------+---------+-----------+----------+--------------+ POP      Full           Yes      Yes                                 +---------+---------------+---------+-----------+----------+--------------+ PTV                                                   BKA            +---------+---------------+---------+-----------+----------+--------------+ PERO                                                   BKA            +---------+---------------+---------+-----------+----------+--------------+   +---------+---------------+---------+-----------+----------+--------------+ LEFT     CompressibilityPhasicitySpontaneityPropertiesThrombus Aging +---------+---------------+---------+-----------+----------+--------------+ CFV      Full           Yes      Yes                                 +---------+---------------+---------+-----------+----------+--------------+ SFJ      Full                                                        +---------+---------------+---------+-----------+----------+--------------+ FV Prox  Full                                                        +---------+---------------+---------+-----------+----------+--------------+ FV Mid   Full                                                        +---------+---------------+---------+-----------+----------+--------------+ FV DistalFull                                                        +---------+---------------+---------+-----------+----------+--------------+  PFV      Full                                                        +---------+---------------+---------+-----------+----------+--------------+ POP      Full           Yes      Yes                                 +---------+---------------+---------+-----------+----------+--------------+ PTV      Full                                                        +---------+---------------+---------+-----------+----------+--------------+ PERO     Full                                                        +---------+---------------+---------+-----------+----------+--------------+     Summary: BILATERAL: - No evidence of deep vein thrombosis seen in the lower extremities, bilaterally. -No evidence of popliteal cyst, bilaterally.   *See table(s) above for measurements and observations.    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  Assessment/Plan:  Cholangiocarcinoma CT abdomen/pelvis 11/14/2023-closed-loop small bowel obstruction, 8.8 cm hypoattenuating lesion in the posterior right liver with suggestion of nodular peripheral enhancement, small bilateral pleural effusions MRI abdomen 01/27/2024-rim-enhancing centrally hypoattenuating mass in the posterior right liver segment 7 measuring 11 x 8.4 cm, increased in size compared to the 11/14/2023 CT consistent with a necrotic metastasis or primary liver mass, severe anasarca, small volume ascites, bilateral pleural effusions, mild splenomegaly CT abdomen/pelvis 02/14/2024-no change in complex right liver lesion, small bilateral pleural effusions left greater than right, soft tissue edema of the anterior abdominal wall Biopsy liver lesion 03/06/2024-benign hepatic parenchyma with reactive changes CT chest 03/09/2024-a few scattered small lung nodules measuring up to 4 mm right lower and left upper lobes.  Mild cardiomegaly.  Stable right and moderate left pleural effusions.  Anasarca.  Similar large  hypodense mass  filling the posterior right hepatic lobe. 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  He is now admitted with volume overload related to severe cardiomyopathy.  He is being treated with diuresis and pressor support.  His case was presented at the GI tumor conference yesterday.  Review of imaging studies reveals a technically resectable right hepatic mass.  Small lymph nodes are noted at the porta hepatis and retroperitoneum.  A staging PET is recommended after discharge from the hospital. Dr. Leighton Punches plans to see Eduardo [name] while he is in the hospital, but it appears he will not be a surgical candidate.  We can consider other treatment options including hepatic embolization and systemic therapy.  We are waiting on results of molecular testing on the liver biopsy tissue.  Recommendations: Management of cardiomyopathy/volume overload per cardiology and critical care medicine Outpatient staging PET-we will order Outpatient follow-up as scheduled at the Cancer center Please call oncology as needed   LOS: 6 days   Coni Deep, MD    03/30/2024, 7:52 AM

## 2024-03-30 NOTE — Progress Notes (Signed)
 Advanced Heart Failure Rounding Note  Cardiologist: Cody Das, MD  Chief Complaint: acute on chronic heart failure w low output    Patient Profile   Patient is a 68 year old gentleman with a past medical history of chronic systolic heart failure due to CAD, prior R BKA, CKD who presented for scheduled admission for IV diuresis given recent diagnosis of cholangiocarcinoma.    He was admitted to the floor, but had poor response to escalating doses of IV diuretics. PICC was placed and showed borderline co-ox. Further diuresis limited by hypotension.  Transferred to 2H for initiation of NE support and AHF team consultation.   Subjective/Interval Hx:    RHC 5/21: severely elevated left sided filling pressures with PCWP of 29 and reduced TD CI of 1.8. Started on DBA  Remains on DBA 5. RN unable to wean off NE. He remains on NE 10 this morning. SBPs 90s. Co-ox 66%.   UOP picking up, had 3.7L in UOP yesterday. SCr stable/unchanged at 1.75.   Glucose levels 300s   Still feels tired and fatigued but no current resting dyspnea. Feels slightly better w/ volume removal.   Objective:   Weight Range: 99.4 kg Body mass index is 32.36 kg/m.   Vital Signs:   Temp:  [97.7 F (36.5 C)-98.4 F (36.9 C)] 98.4 F (36.9 C) (05/22 0700) Pulse Rate:  [0-102] 85 (05/22 1000) Resp:  [11-31] 18 (05/22 1000) BP: (80-119)/(45-85) 92/78 (05/22 1000) SpO2:  [79 %-98 %] 97 % (05/22 1000) Weight:  [99.4 kg] 99.4 kg (05/22 0815) Last BM Date : 03/29/24  Weight change: Filed Weights   03/29/24 0455 03/29/24 0940 03/30/24 0815  Weight: 98.6 kg 98.6 kg 99.4 kg    Intake/Output:   Intake/Output Summary (Last 24 hours) at 03/30/2024 1034 Last data filed at 03/30/2024 1025 Gross per 24 hour  Intake 1478.86 ml  Output 4150 ml  Net -2671.14 ml      Physical Exam    General:  fatigued appearing, sitting up in chair. No respiratory difficulty HEENT: normal Neck: supple. JVD 10 cm.  Carotids 2+ bilat; no bruits. No lymphadenopathy or thyromegaly appreciated. Cor: PMI nondisplaced. Regular rate & rhythm. No rubs, gallops or murmurs. Lungs: decreased BS at the bases  Abdomen: soft, nontender, nondistended.  Extremities: no cyanosis, clubbing, rash, edema + RUE PICC, RLE amputation (prosthetic leg) LLE 1+ edema  Neuro: alert & oriented x 3, cranial nerves grossly intact. moves all 4 extremities w/o difficulty. Affect pleasant.   Telemetry   NSR 90s, occasional PVCs, 1 14 beat run of NSVT this morning personally reviewed   EKG    N/A   Labs    CBC Recent Labs    03/29/24 1259 03/30/24 0508  WBC  --  6.5  HGB 13.3  12.9* 11.2*  HCT 39.0  38.0* 34.1*  MCV  --  83.2  PLT  --  181   Basic Metabolic Panel Recent Labs    02/54/27 0502 03/29/24 1259 03/29/24 2308 03/30/24 0508  NA 133*   < > 129* 129*  K 3.7   < > 3.4* 3.6  CL 96*  --  95* 94*  CO2 22  --  25 24  GLUCOSE 292*  --  340* 349*  BUN 45*  --  44* 42*  CREATININE 1.79*  --  1.70* 1.75*  CALCIUM  8.7*  --  8.4* 8.3*  MG 2.1  --   --  2.1   < > =  values in this interval not displayed.   Liver Function Tests No results for input(s): "AST", "ALT", "ALKPHOS", "BILITOT", "PROT", "ALBUMIN " in the last 72 hours.  No results for input(s): "LIPASE", "AMYLASE" in the last 72 hours. Cardiac Enzymes No results for input(s): "CKTOTAL", "CKMB", "CKMBINDEX", "TROPONINI" in the last 72 hours.  BNP: BNP (last 3 results) Recent Labs    11/14/23 0229 03/24/24 2239  BNP 1,274.6* 4,192.8*    ProBNP (last 3 results) No results for input(s): "PROBNP" in the last 8760 hours.   D-Dimer No results for input(s): "DDIMER" in the last 72 hours. Hemoglobin A1C No results for input(s): "HGBA1C" in the last 72 hours. Fasting Lipid Panel No results for input(s): "CHOL", "HDL", "LDLCALC", "TRIG", "CHOLHDL", "LDLDIRECT" in the last 72 hours. Thyroid  Function Tests No results for input(s): "TSH", "T4TOTAL",  "T3FREE", "THYROIDAB" in the last 72 hours.  Invalid input(s): "FREET3"  Other results:   Imaging    VAS US  LOWER EXTREMITY VENOUS (DVT) Result Date: 03/29/2024  Lower Venous DVT Study Patient Name:  Eduardo [name]  Date of Exam:   03/29/2024 Medical Rec #: [id number]     Accession #:    [id number] Date of Birth: 07-Feb-1956    Patient Gender: M Patient Age:   10 years Exam Location:  [facility] Procedure:      VAS US  LOWER EXTREMITY VENOUS (DVT) Referring Phys: Jaquita Merl --------------------------------------------------------------------------------  Indications: Edema.  Risk Factors: Surgery RT BKA. Performing Technologist: Aloma Arrant RVS  Examination Guidelines: A complete evaluation includes B-mode imaging, spectral Doppler, color Doppler, and power Doppler as needed of all accessible portions of each vessel. Bilateral testing is considered an integral part of a complete examination. Limited examinations for reoccurring indications may be performed as noted. The reflux portion of the exam is performed with the patient in reverse Trendelenburg.  +---------+---------------+---------+-----------+----------+--------------+ RIGHT    CompressibilityPhasicitySpontaneityPropertiesThrombus Aging +---------+---------------+---------+-----------+----------+--------------+ CFV      Full           Yes      Yes                                 +---------+---------------+---------+-----------+----------+--------------+ SFJ      Full                                                        +---------+---------------+---------+-----------+----------+--------------+ FV Prox  Full                                                        +---------+---------------+---------+-----------+----------+--------------+ FV Mid   Full                                                        +---------+---------------+---------+-----------+----------+--------------+ FV DistalFull                                                         +---------+---------------+---------+-----------+----------+--------------+  PFV      Full                                                        +---------+---------------+---------+-----------+----------+--------------+ POP      Full           Yes      Yes                                 +---------+---------------+---------+-----------+----------+--------------+ PTV                                                   BKA            +---------+---------------+---------+-----------+----------+--------------+ PERO                                                  BKA            +---------+---------------+---------+-----------+----------+--------------+   +---------+---------------+---------+-----------+----------+--------------+ LEFT     CompressibilityPhasicitySpontaneityPropertiesThrombus Aging +---------+---------------+---------+-----------+----------+--------------+ CFV      Full           Yes      Yes                                 +---------+---------------+---------+-----------+----------+--------------+ SFJ      Full                                                        +---------+---------------+---------+-----------+----------+--------------+ FV Prox  Full                                                        +---------+---------------+---------+-----------+----------+--------------+ FV Mid   Full                                                        +---------+---------------+---------+-----------+----------+--------------+ FV DistalFull                                                        +---------+---------------+---------+-----------+----------+--------------+ PFV      Full                                                        +---------+---------------+---------+-----------+----------+--------------+  POP      Full           Yes      Yes                                  +---------+---------------+---------+-----------+----------+--------------+ PTV      Full                                                        +---------+---------------+---------+-----------+----------+--------------+ PERO     Full                                                        +---------+---------------+---------+-----------+----------+--------------+     Summary: BILATERAL: - No evidence of deep vein thrombosis seen in the lower extremities, bilaterally. -No evidence of popliteal cyst, bilaterally.   *See table(s) above for measurements and observations.    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 Severely reduced cardiac output by TD, though with a significant amount of variation, low normal by assumed Fick RECOMMENDATIONS: Would start low dose dobutamine 2.5 for augmentation, continue IV lasix  gtt High risk for any invasive surgical procedure given end stage cardiomyopathy     Medications:     Scheduled Medications:  acetaZOLAMIDE  500 mg Oral BID   amiodarone   400 mg Oral BID   Chlorhexidine  Gluconate Cloth  6 each Topical Daily   cholecalciferol   2,000 Units Oral Daily   clopidogrel   75 mg Oral Daily   ezetimibe   10 mg Oral Daily   guaiFENesin   600 mg Oral BID   insulin  aspart  0-20 Units Subcutaneous TID WC   insulin  aspart  0-5 Units Subcutaneous QHS   insulin  aspart  4 Units Subcutaneous TID WC   insulin  NPH Human  6 Units Subcutaneous QAC  breakfast   multivitamin with minerals  1 tablet Oral Daily   polyethylene glycol  17 g Oral Daily   rosuvastatin   20 mg Oral Daily   sodium chloride  flush  10-40 mL Intracatheter Q12H   sodium chloride  flush  3 mL Intravenous Q12H   tamsulosin  0.4 mg Oral QPC supper    Infusions:  DOBUTamine 5 mcg/kg/min (03/30/24 0700)   furosemide  (LASIX ) 200 mg in dextrose  5 % 100 mL (2 mg/mL) infusion 20 mg/hr (03/30/24 0700)   iron sucrose (VENOFER) 500 mg in sodium chloride  0.9 % 250 mL IVPB     norepinephrine  (LEVOPHED ) Adult infusion  10 mcg/min (03/30/24 1028)    PRN Medications: acetaminophen , melatonin, ondansetron  (ZOFRAN ) IV, sodium chloride  flush, sodium chloride  flush    Assessment/Plan   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 Longstanding history of severely reduced EF <20-25% since 2021 with diastolic dysfunction and relatively ok RV. - Has CRT-D - Echo this admission with EF 20-25% and new severely reduced RV, mild MR - Admitted from cardiology clinic for IV diuresis. Has had minimal response. - RHC 5/21 on 8 mcg/min of NE showed severely elevated left sided filling pressures with PCWP of 29 and reduced TD CI of 1.8. - Now on DBA 5 + NE 12. Co-ox 66%, SBPs 90s. UOP picking up, on lasix  gtt at 30/h. Symptomatically improving    - Continue current inotropic support w/ DBA. Wean NE as BP permits - Continue Lasix  gtt at 30/hr  - GDMT limited by hypotension and renal function - Prev worked up for VAD in 2022, did not want to pursue. At this point would not be a candidate for advanced therapies. Will continue to optimize medically.   2. CAD - severe native three-vessel coronary artery disease (LAD 100%p LCX all marginals occluded< RCA diffuse distal 99%  - stable w/o CP  - continue crestor , plavix , gemfibriozil, and ezetimibe    3. NSVT  - 1 14 beat run observed today - continue PO amio 400 mg bid for now  - if burden increases, can switch to IV amio while on  dual inotropes  - monitor lytes w/ diuresis, keep K > 4.0 and Mg > 2.0    4. AKI on CKD 3b - b/l Cr 1.3-1.5 - Scr 1.75 today, cardiorenal component w/ high filling pressures and low output   - continue diuresis and BP/inotropic support per above   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 Seen by Dr. Scherrie Curt this admit. Not a surgical candidate currently. Can consider other treatment options including hepatic embolization and systemic therapy pending molecular testing of liver tissue biopsy and outpatient PET  scan    CRITICAL CARE Performed by: Ruddy Corral   Total critical care time: 15 minutes  Critical care time was exclusive of separately billable procedures and treating other patients.  Critical care was necessary to treat or prevent imminent or life-threatening deterioration.  Critical care was time spent personally by me on the following activities: development of treatment plan with patient and/or surrogate as well as nursing, discussions with consultants, evaluation of patient's response to treatment, examination of patient, obtaining history from patient or surrogate, ordering and performing treatments and interventions, ordering and review of laboratory studies, ordering and review of radiographic studies, pulse oximetry and re-evaluation of patient's condition.   Length of Stay: [address], [place]  03/30/2024, 10:34 AM  Advanced Heart Failure Team Pager [phone] (M-F; 7a - 5p)  Please contact [facility] for night-coverage after hours (5p -7a ) and weekends on [email or web address]

## 2024-03-30 NOTE — Consult Note (Signed)
 Eduardo [name] May 14, 1956  [id number].    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 He presented with a closed loop bowel obstruction in January of this year, for which he underwent an emergent laparotomy and small bowel resection by Dr. Dorrie Gaudier on 11/14/23. His CT scan at that time incidentally showed an 8cm indeterminate mass in the right posterior liver. He had an outpatient liver MRI on 3/28, which showed an 11cm mass in the right posterior sector with central necrosis, consistent with a malignancy. He had an US -guided biopsy on 4/28, which showed poorly-differentiated carcinoma, most consistent with a cholangiocarcinoma.  Since his bowel resection earlier this year, he has had progressive fatigue and edema with volume overload. He was referred to see me in the office to evaluate for resection of his liver mass, but has has been admitted with volume overload. He developed low blood pressure and is now in the ICU for inotropic support. Echo showed reduction in his LVEF to 20-25%, and RHC yesterday showed severely elevated left-sided filling pressures. Per his cardiology team, he has end-stage CHF with no further advanced therapy options for treatment. Today he reports he is slowly feeling better with fluid removal, but is still fatigued. He is aware that surgery would be very high risk.  ROS: Review of Systems  Constitutional:  Positive for malaise/fatigue.  Gastrointestinal:  Negative for abdominal pain, nausea and vomiting.    Family History  Problem Relation Age of Onset   Diabetes Mother    Hearing loss Mother    Hypertension Mother    Hyperlipidemia Mother    Heart disease Mother    Varicose Veins Mother    Varicose Veins Father    Dementia Father    Hyperlipidemia  Brother    Diabetes Maternal Grandmother     Past Medical History:  Diagnosis Date   CHF (congestive heart failure) (HCC)    Chronic kidney disease    stage 3   Colon polyp    Coronary artery disease    Diabetes mellitus 1987   under care of Dr. [name].  On insulin  since 96 (off and on)   Diabetic retinopathy    Dupuytren contracture    R hand, s/p injection (Dr. Jonna Netter)   Essential hypertension, benign    Essential hypertension, benign 02/06/2019   Frequency of urination and polyuria    Hypertension    Myocardial infarction [facility])    denies   Neuromuscular disorder (HCC)    Diabetic neuropathy   Osteomyelitis (HCC)    right foot   Other testicular hypofunction    Peripheral arterial disease (HCC) 10/28/2012   Peritoneal abscess (HCC) 6/08   and buttock.   Pneumonia    Polydipsia    Proteinuria    Pure hyperglyceridemia    Subacute osteomyelitis, right ankle and foot (HCC)    Wears glasses     Past Surgical History:  Procedure Laterality Date   ABDOMINAL AORTAGRAM N/A 04/18/2012   Procedure: ABDOMINAL Tommi Fraise;  Surgeon: Dannis Dy, MD;  Location: [facility] CATH LAB;  Service: Cardiovascular;  Laterality: N/A;   AMPUTATION Right 05/19/2019   Procedure: RIGHT FOOT 5TH RAY AMPUTATION;  Surgeon: Timothy Ford, MD;  Location: [facility] OR;  Service: Orthopedics;  Laterality: Right;   AMPUTATION Right 03/11/2022   Procedure: RIGHT LEG DEBRIDEMENT VS.  BELOW KNEE AMPUTATION;  Surgeon: Timothy Ford, MD;  Location: [facility] OR;  Service: Orthopedics;  Laterality: Right;   BIV ICD INSERTION CRT-D N/A 10/21/2022   Procedure: BIV ICD INSERTION CRT-D;  Surgeon: Tammie Fall, MD;  Location: [facility] INVASIVE CV LAB;  Service: Cardiovascular;  Laterality: N/A;   BOWEL RESECTION N/A 11/14/2023   Procedure: SMALL BOWEL RESECTION;  Surgeon: Dorrie Gaudier, Alphonso Aschoff, MD;  Location: [facility] OR;  Service: General;  Laterality: N/A;   CARDIAC CATHETERIZATION N/A 09/08/2016   Procedure: Left Heart Cath and  Coronary Angiography;  Surgeon: Knox Perl, MD;  Location: [facility] INVASIVE CV LAB;  Service: Cardiovascular;  Laterality: N/A;   CATARACT EXTRACTION, BILATERAL  09/2017, 10/2017   Dr. Lasandra Points   COLONOSCOPY W/ BIOPSIES AND POLYPECTOMY     CORONARY/GRAFT ACUTE MI REVASCULARIZATION N/A 10/11/2020   Procedure: Coronary/Graft Acute MI Revascularization;  Surgeon: Knox Perl, MD;  Location: [facility] INVASIVE CV LAB;  Service: Cardiovascular;  Laterality: N/A;   I & D EXTREMITY Right 03/11/2022   Procedure: BELOW KNEE AMPUTATION;  Surgeon: Timothy Ford, MD;  Location: [facility] OR;  Service: Orthopedics;  Laterality: Right;   INSERT / REPLACE / REMOVE PACEMAKER     LAPAROSCOPY N/A 11/14/2023   Procedure: LAPAROSCOPY DIAGNOSTIC;  Surgeon: Dorrie Gaudier, Alphonso Aschoff, MD;  Location: [facility] OR;  Service: General;  Laterality: N/A;  Converted to open ex-lap after inserting first trocar   LAPAROTOMY N/A 11/14/2023   Procedure: EXPLORATORY LAPAROTOMY;  Surgeon: Dorrie Gaudier Alphonso Aschoff, MD;  Location: [facility] OR;  Service: General;  Laterality: N/A;   LEFT HEART CATH N/A 12/31/2020   Procedure: Left Heart Cath;  Surgeon: Knox Perl, MD;  Location: [facility] INVASIVE CV LAB;  Service: Cardiovascular;  Laterality: N/A;   LEFT HEART CATH AND CORONARY ANGIOGRAPHY N/A 10/11/2020   Procedure: LEFT HEART CATH AND CORONARY ANGIOGRAPHY;  Surgeon: Knox Perl, MD;  Location: [facility] INVASIVE CV LAB;  Service: Cardiovascular;  Laterality: N/A;   LOWER EXTREMITY ANGIOGRAM Bilateral 04/18/2012   Procedure: LOWER EXTREMITY ANGIOGRAM;  Surgeon: Dannis Dy, MD;  Location: [facility] CATH LAB;  Service: Cardiovascular;  Laterality: Bilateral;  bilat lower extrem angio   LOWER EXTREMITY ANGIOGRAPHY Bilateral 05/02/2019   Procedure: LOWER EXTREMITY ANGIOGRAPHY;  Surgeon: Knox Perl, MD;  Location: [facility] INVASIVE CV LAB;  Service: Cardiovascular;  Laterality: Bilateral;   LOWER EXTREMITY ANGIOGRAPHY Bilateral 02/28/2019   Procedure: LOWER EXTREMITY ANGIOGRAPHY;  Surgeon:  Knox Perl, MD;  Location: [facility] INVASIVE CV LAB;  Service: Cardiovascular;  Laterality: Bilateral;   macular photocoagulation     (eye treatments for diabetic retinopathy)-Dr. Augustus Ledger   PERIPHERAL VASCULAR INTERVENTION  02/28/2019   Procedure: PERIPHERAL VASCULAR INTERVENTION;  Surgeon: Knox Perl, MD;  Location: [facility] INVASIVE CV LAB;  Service: Cardiovascular;;   RIGHT HEART CATH N/A 03/29/2024   Procedure: RIGHT HEART CATH;  Surgeon: Lauralee Poll, MD;  Location: [facility] INVASIVE CV LAB;  Service: Cardiovascular;  Laterality: N/A;   TEE WITHOUT CARDIOVERSION N/A 03/18/2022   Procedure: TRANSESOPHAGEAL ECHOCARDIOGRAM (TEE);  Surgeon: Knox Perl, MD;  Location: [facility] ENDOSCOPY;  Service: Cardiovascular;  Laterality: N/A;   VENTRICULAR ASSIST DEVICE INSERTION N/A 12/31/2020   Procedure: VENTRICULAR ASSIST DEVICE INSERTION;  Surgeon: Knox Perl, MD;  Location: [facility] INVASIVE CV LAB;  Service: Cardiovascular;  Laterality: N/A;    Social History:  reports that he quit smoking about 12 years ago. His smoking use included cigarettes. He started smoking about 42 years ago. He has a 30 pack-year smoking history. He has never used smokeless  tobacco. He reports that he does not currently use alcohol. He reports that he does not use drugs.  Allergies:  Allergies  Allergen Reactions   Empagliflozin      Other Reaction(s): foot infection   Shellfish-Derived Products Nausea And Vomiting and Other (See Comments)    Only Mussels cause severe nausea and vomiting    Latex Rash   Tape Rash and Other (See Comments)    Caused issues with the skin   Testosterone  Rash    did not feel well     Medications Prior to Admission  Medication Sig Dispense Refill   acetaminophen  (TYLENOL ) 325 MG tablet Take 1-2 tablets (325-650 mg total) by mouth every 4 (four) hours as needed for mild pain.     Bioflavonoid Products (ESTER C PO) Take 1,000 mg by mouth daily.     bumetanide  (BUMEX ) 2 MG tablet Take 1 tablet (2 mg total) by  mouth 2 (two) times daily. 180 tablet 3   Cholecalciferol  (VITAMIN D ) 2000 units CAPS Take 2,000 Units by mouth daily.     clopidogrel  (PLAVIX ) 75 MG tablet Take 1 tablet (75 mg total) by mouth daily. 90 tablet 2   Continuous Glucose Sensor (FREESTYLE LIBRE 2 PLUS SENSOR) MISC 1 Application.     ezetimibe  (ZETIA ) 10 MG tablet TAKE 1 TABLET BY MOUTH DAILY 90 tablet 3   gemfibrozil  (LOPID ) 600 MG tablet Take 600 mg by mouth 2 (two) times daily before a meal.     HUMALOG KWIKPEN 100 UNIT/ML KiwkPen Inject 3-10 Units into the skin 3 (three) times daily. Inject 01-1009 units into the skin three times a day, per sliding scale- based on BGL >100  1   HUMULIN  N KWIKPEN 100 UNIT/ML KwikPen Inject 6 Units into the skin daily.     Krill Oil 300 MG CAPS Take 500 mg by mouth daily.     lidocaine  (XYLOCAINE ) 5 % ointment Apply 1 Application topically as needed. For pain as needed- can use up to 4x/day 50 g 5   melatonin 10 MG TABS Take 10 mg by mouth at bedtime.  0   Multiple Vitamin (MULTIVITAMIN WITH MINERALS) TABS tablet Take 1 tablet by mouth daily.     nitroGLYCERIN  (NITROSTAT ) 0.4 MG SL tablet Place 1 tablet (0.4 mg total) under the tongue every 5 (five) minutes as needed for chest pain. 15 tablet 3   Probiotic Product (PROBIOTIC-10 PO) Take 1 capsule by mouth daily.     rosuvastatin  (CRESTOR ) 20 MG tablet Take 1 tablet (20 mg total) by mouth daily. 90 tablet 3   sacubitril -valsartan  (ENTRESTO ) 24-26 MG TAKE 1 TABLET BY MOUTH TWICE  DAILY 180 tablet 3   sodium chloride  (OCEAN) 0.65 % SOLN nasal spray Place 1 spray into both nostrils as needed for congestion.     tirzepatide (MOUNJARO) 12.5 MG/0.5ML Pen Inject 12.5 mg into the skin once a week.       Physical Exam: Blood pressure (!) 96/54, pulse 89, temperature 98.4 F (36.9 C), temperature source Oral, resp. rate 19, height 5\' 9"  (1.753 m), weight 99.4 kg, SpO2 96%.  General: resting comfortably, sitting up in char, appears stated age, no apparent  distress Neurological: alert and oriented, no focal deficits, cranial nerves grossly in tact HEENT: normocephalic, atraumatic, no scleral icterus CV: regular rate and rhythm, extremities warm and well-perfused Respiratory: normal work of breathing, symmetric chest wall expansion Abdomen: soft, nondistended, nontender to palpation. Psychiatric: normal mood and affect Skin: warm and dry, no jaundice, no rashes  or lesions     Assessment/Plan 68 yo male with recently-diagnosed intrahepatic cholangiocarcinoma, with a history of CHF with reduced EF, now admitted with heart failure and volume overload. His liver tumor is technically resectable but he is not a surgical candidate due to his advanced heart failure. I discussed this with him today and he understands he is unfortunately not a candidate for invasive interventions. Recommend evaluation for liver-directed therapies vs chemotherapy. He plans to follow up with Dr. Scherrie Curt.   Karleen Overall, MD [facility] Surgery General, Hepatobiliary and Pancreatic Surgery 03/30/24 9:41 AM

## 2024-03-31 ENCOUNTER — Encounter (HOSPITAL_COMMUNITY): Payer: Self-pay

## 2024-03-31 DIAGNOSIS — I5043 Acute on chronic combined systolic (congestive) and diastolic (congestive) heart failure: Secondary | ICD-10-CM | POA: Diagnosis not present

## 2024-03-31 LAB — BASIC METABOLIC PANEL WITH GFR
Anion gap: 9 (ref 5–15)
BUN: 38 mg/dL — ABNORMAL HIGH (ref 8–23)
CO2: 27 mmol/L (ref 22–32)
Calcium: 8.2 mg/dL — ABNORMAL LOW (ref 8.9–10.3)
Chloride: 94 mmol/L — ABNORMAL LOW (ref 98–111)
Creatinine, Ser: 1.7 mg/dL — ABNORMAL HIGH (ref 0.61–1.24)
GFR, Estimated: 43 mL/min — ABNORMAL LOW (ref >60)
Glucose, Bld: 276 mg/dL — ABNORMAL HIGH (ref 70–99)
Potassium: 3.6 mmol/L (ref 3.5–5.1)
Sodium: 130 mmol/L — ABNORMAL LOW (ref 135–145)

## 2024-03-31 LAB — COOXEMETRY PANEL
Carboxyhemoglobin: 0.9 % (ref 0.5–1.5)
Carboxyhemoglobin: 0.9 % (ref 0.5–1.5)
Methemoglobin: <0.7 % (ref 0.0–1.5)
Methemoglobin: <0.7 % (ref 0.0–1.5)
O2 Saturation: 91.6 %
O2 Saturation: 67.3 %
Total hemoglobin: 11.3 g/dL — ABNORMAL LOW (ref 12.0–16.0)
Total hemoglobin: 10.3 g/dL — ABNORMAL LOW (ref 12.0–16.0)

## 2024-03-31 LAB — CBC
HCT: 34 % — ABNORMAL LOW (ref 39.0–52.0)
Hemoglobin: 10.8 g/dL — ABNORMAL LOW (ref 13.0–17.0)
MCH: 26.9 pg (ref 26.0–34.0)
MCHC: 31.8 g/dL (ref 30.0–36.0)
MCV: 84.6 fL (ref 80.0–100.0)
Platelets: 191 10*3/uL (ref 150–400)
RBC: 4.02 MIL/uL — ABNORMAL LOW (ref 4.22–5.81)
RDW: 19.8 % — ABNORMAL HIGH (ref 11.5–15.5)
WBC: 6.6 10*3/uL (ref 4.0–10.5)
nRBC: 0 % (ref 0.0–0.2)

## 2024-03-31 LAB — GLUCOSE, CAPILLARY
Glucose-Capillary: 215 mg/dL — ABNORMAL HIGH (ref 70–99)
Glucose-Capillary: 235 mg/dL — ABNORMAL HIGH (ref 70–99)
Glucose-Capillary: 136 mg/dL — ABNORMAL HIGH (ref 70–99)
Glucose-Capillary: 103 mg/dL — ABNORMAL HIGH (ref 70–99)

## 2024-03-31 LAB — MAGNESIUM: Magnesium: 2.2 mg/dL (ref 1.7–2.4)

## 2024-03-31 MED ORDER — BUMETANIDE 2 MG PO TABS
2.0000 mg | ORAL_TABLET | Freq: Two times a day (BID) | ORAL | Status: DC
  Administered 2024-03-31 – 2024-04-02 (×5): 2 mg via ORAL
  Filled 2024-03-31 (×5): qty 1

## 2024-03-31 MED ORDER — INSULIN NPH (HUMAN) (ISOPHANE) 100 UNIT/ML ~~LOC~~ SUSP
8.0000 [IU] | Freq: Every day | SUBCUTANEOUS | Status: DC
  Administered 2024-04-01 – 2024-04-02 (×2): 8 [IU] via SUBCUTANEOUS
  Filled 2024-03-31: qty 10

## 2024-03-31 MED ORDER — POTASSIUM CHLORIDE 10 MEQ/50ML IV SOLN
10.0000 meq | INTRAVENOUS | Status: AC
  Administered 2024-03-31 (×2): 10 meq via INTRAVENOUS
  Filled 2024-03-31 (×2): qty 50

## 2024-03-31 NOTE — Progress Notes (Signed)
 Advanced Heart Failure Rounding Note  Cardiologist: Cody Das, MD  Chief Complaint: acute on chronic heart failure w low output    Patient Profile   Patient is a 68 year old Eduardo [name] with a past medical history of chronic systolic heart failure due to CAD, prior R BKA, CKD who presented for scheduled admission for IV diuresis given recent diagnosis of cholangiocarcinoma.    He was admitted to the floor, but had poor response to escalating doses of IV diuretics. PICC was placed and showed borderline co-ox. Further diuresis limited by hypotension.  Transferred to 2H for initiation of NE support and AHF team consultation.   Subjective/Interval Hx:    RHC 5/21: severely elevated left sided filling pressures with PCWP of 29 and reduced TD CI of 1.8. Started on DBA  Had 23 beat run of NSVT overnight. DBA reduced from 5>>3 mcg/kg/min. Improved w/ less ectopy this morning.    Remains on DBA 3 + NE 7. Co-ox 67%.   Good diuresis yesterday on lasix  gtt at 20/hr + diamox . 5.9L in UOP. CVP trending down, 5. Symptomatically improved. Breathing better but still tired. Wants to be freed from gtts and lines.    Objective:   Weight Range: 98.6 kg Body mass index is 32.1 kg/m.   Vital Signs:   Temp:  [97.6 F (36.4 C)-98.3 F (36.8 C)] 98.1 F (36.7 C) (05/23 0754) Pulse Rate:  [77-99] 86 (05/23 0600) Resp:  [2-29] 22 (05/23 0600) BP: (77-106)/(44-85) 98/64 (05/23 0600) SpO2:  [86 %-99 %] 96 % (05/23 0600) Weight:  [98.6 kg] 98.6 kg (05/23 0350) Last BM Date : 03/29/24  Weight change: Filed Weights   03/29/24 0940 03/30/24 0815 03/31/24 0350  Weight: 98.6 kg 99.4 kg 98.6 kg    Intake/Output:   Intake/Output Summary (Last 24 hours) at 03/31/2024 0911 Last data filed at 03/31/2024 0600 Gross per 24 hour  Intake 2585.73 ml  Output 5525 ml  Net -2939.27 ml      Physical Exam   CVP 5  General:  Well appearing, sitting up in chair. No respiratory difficulty HEENT:  normal Neck: supple. JVD 6 cm. Carotids 2+ bilat; no bruits. No lymphadenopathy or thyromegaly appreciated. Cor: PMI nondisplaced. Regular rate & rhythm. No rubs, gallops or murmurs. Lungs: clear Abdomen: soft, nontender, nondistended. Extremities: no cyanosis, clubbing, rash, RLE amputation w/ prosthetic leg, no edema on the LLE + RUE PICC  Neuro: alert & oriented x 3, cranial nerves grossly intact. moves all 4 extremities w/o difficulty. Affect pleasant.    Telemetry   NSR 80s, 23 beat run of NSVT over night personally reviewed   EKG    N/A   Labs    CBC Recent Labs    03/30/24 0508 03/31/24 0413  WBC 6.5 6.6  HGB 11.2* 10.8*  HCT 34.1* 34.0*  MCV 83.2 84.6  PLT 181 191   Basic Metabolic Panel Recent Labs    29/56/21 0508 03/31/24 0413  NA 129* 130*  K 3.6 3.6  CL 94* 94*  CO2 24 27  GLUCOSE 349* 276*  BUN 42* 38*  CREATININE 1.75* 1.70*  CALCIUM  8.3* 8.2*  MG 2.1 2.2   Liver Function Tests No results for input(s): "AST", "ALT", "ALKPHOS", "BILITOT", "PROT", "ALBUMIN " in the last 72 hours.  No results for input(s): "LIPASE", "AMYLASE" in the last 72 hours. Cardiac Enzymes No results for input(s): "CKTOTAL", "CKMB", "CKMBINDEX", "TROPONINI" in the last 72 hours.  BNP: BNP (last 3 results) Recent Labs  11/14/23 0229 03/24/24 2239  BNP 1,274.6* 4,192.8*    ProBNP (last 3 results) No results for input(s): "PROBNP" in the last 8760 hours.   D-Dimer No results for input(s): "DDIMER" in the last 72 hours. Hemoglobin A1C No results for input(s): "HGBA1C" in the last 72 hours. Fasting Lipid Panel No results for input(s): "CHOL", "HDL", "LDLCALC", "TRIG", "CHOLHDL", "LDLDIRECT" in the last 72 hours. Thyroid  Function Tests No results for input(s): "TSH", "T4TOTAL", "T3FREE", "THYROIDAB" in the last 72 hours.  Invalid input(s): "FREET3"  Other results:   Imaging    No results found.    Medications:     Scheduled Medications:   acetaZOLAMIDE  500 mg Oral BID   amiodarone   400 mg Oral BID   bumetanide   2 mg Oral BID   Chlorhexidine  Gluconate Cloth  6 each Topical Daily   cholecalciferol   2,000 Units Oral Daily   clopidogrel   75 mg Oral Daily   enoxaparin  (LOVENOX ) injection  Eduardo mg Subcutaneous Q24H   ezetimibe   10 mg Oral Daily   guaiFENesin   600 mg Oral BID   insulin  aspart  0-20 Units Subcutaneous TID WC   insulin  aspart  0-5 Units Subcutaneous QHS   insulin  aspart  4 Units Subcutaneous TID WC   insulin  NPH Human  6 Units Subcutaneous QAC breakfast   multivitamin with minerals  1 tablet Oral Daily   polyethylene glycol  17 g Oral Daily   rosuvastatin   20 mg Oral Daily   sodium chloride  flush  10-Eduardo mL Intracatheter Q12H   sodium chloride  flush  3 mL Intravenous Q12H   tamsulosin  0.4 mg Oral QPC supper    Infusions:  DOBUTamine 3 mcg/kg/min (03/31/24 0825)   norepinephrine  (LEVOPHED ) Adult infusion 7 mcg/min (03/31/24 0600)    PRN Medications: acetaminophen , melatonin, ondansetron  (ZOFRAN ) IV, sodium chloride  flush, sodium chloride  flush    Assessment/Plan   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 Longstanding history of severely reduced EF <20-25% since 2021 with diastolic dysfunction and relatively ok RV. - Has CRT-D - Echo this admission with EF 20-25% and new severely reduced RV, mild MR - Admitted from cardiology clinic for IV diuresis. Has had minimal response. - RHC 5/21 on 8 mcg/min of NE showed severely elevated left sided filling pressures with PCWP of 29 and reduced TD CI of 1.8. - Now on dual inotropes w/ DBA + NE, Lasix  gtt at 20/hr. Good response last 48 hrs w/ improvement in volume status and symptoms. Tolerating DBA and NE wean, Co-ox 67%. - Stop Lasix  gtt and transition back to Oral home regimen, Bumex  2 mg bid. Give Diamox 500 mg bid today  - Wean NE as tolerated, goal MAP > 65 - Continue DBA 3 through the day and plan to stop ~8 pm this evening  - GDMT limited  by hypotension and renal function - Prev worked up for VAD in 2022, did not want to pursue. At this point would not be a candidate for advanced therapies. Will continue to optimize medically.   2. CAD - severe native three-vessel coronary artery disease (LAD 100%p LCX all marginals occluded< RCA diffuse distal 99%  - denies CP  - continue crestor , plavix , gemfibriozil, and ezetimibe    3. NSVT  - 1 14 beat run yesterday + 23 beat run overnight, in setting of dual inotropes>>now improved w/ DBA wean  - continue PO amio 400 mg bid   - plan to wean down NE and stop DBA this evening -  monitor lytes w/ diuresis, keep K > 4.0 and Mg > 2.0  - he has ICD    4. AKI on CKD 3b - b/l Cr 1.3-1.5 - Scr 1.70 today, cardiorenal component  - HF optimization per above    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 Seen by Dr. Scherrie Curt this admit. Not a surgical candidate currently. Can consider other treatment options including hepatic embolization and systemic therapy pending molecular testing of liver tissue biopsy and outpatient PET  scan   He is tired of being here in the hospital. Will try to wean off DBA and NE w/ goal to get home over the weekend.    CRITICAL CARE Performed by: Ruddy Corral   Total critical care time: 15 minutes  Critical care time was exclusive of separately billable procedures and treating other patients.  Critical care was necessary to treat or prevent imminent or life-threatening deterioration.  Critical care was time spent personally by me on the following activities: development of treatment plan with patient and/or surrogate as well as nursing, discussions with consultants, evaluation of patient's response to treatment, examination of patient, obtaining history from patient or surrogate, ordering and performing treatments and interventions, ordering and review of laboratory  studies, ordering and review of radiographic studies, pulse oximetry and re-evaluation of patient's condition.    Length of Stay: [address], [place]  03/31/2024, 9:11 AM  Advanced Heart Failure Team Pager [phone] (M-F; 7a - 5p)  Please contact [facility] for night-coverage after hours (5p -7a ) and weekends on [email or web address]

## 2024-03-31 NOTE — Progress Notes (Signed)
 The proposed treatment discussed in conference is for discussion purpose only and is not a binding recommendation.  The patients have not been physically examined, or presented with their treatment options.  Therefore, final treatment plans cannot be decided.

## 2024-03-31 NOTE — Plan of Care (Signed)
 Problem: Education: Goal: Knowledge of General Education information will improve Description: Including pain rating scale, medication(s)/side effects and non-pharmacologic comfort measures Outcome: Progressing   Problem: Health Behavior/Discharge Planning: Goal: Ability to manage health-related needs will improve Outcome: Progressing   Problem: Clinical Measurements: Goal: Ability to maintain clinical measurements within normal limits will improve Outcome: Progressing Goal: Will remain free from infection Outcome: Progressing Goal: Diagnostic test results will improve Outcome: Progressing Goal: Respiratory complications will improve Outcome: Progressing Goal: Cardiovascular complication will be avoided Outcome: Progressing   Problem: Activity: Goal: Risk for activity intolerance will decrease Outcome: Progressing   Problem: Nutrition: Goal: Adequate nutrition will be maintained Outcome: Progressing   Problem: Coping: Goal: Level of anxiety will decrease Outcome: Progressing   Problem: Elimination: Goal: Will not experience complications related to bowel motility Outcome: Progressing Goal: Will not experience complications related to urinary retention Outcome: Progressing   Problem: Pain Managment: Goal: General experience of comfort will improve and/or be controlled Outcome: Progressing   Problem: Safety: Goal: Ability to remain free from injury will improve Outcome: Progressing   Problem: Skin Integrity: Goal: Risk for impaired skin integrity will decrease Outcome: Progressing   Problem: Education: Goal: Ability to describe self-care measures that may prevent or decrease complications (Diabetes Survival Skills Education) will improve Outcome: Progressing   Problem: Coping: Goal: Ability to adjust to condition or change in health will improve Outcome: Progressing   Problem: Fluid Volume: Goal: Ability to maintain a balanced intake and output will  improve Outcome: Progressing   Problem: Health Behavior/Discharge Planning: Goal: Ability to identify and utilize available resources and services will improve Outcome: Progressing Goal: Ability to manage health-related needs will improve Outcome: Progressing   Problem: Metabolic: Goal: Ability to maintain appropriate glucose levels will improve Outcome: Progressing   Problem: Nutritional: Goal: Maintenance of adequate nutrition will improve Outcome: Progressing Goal: Progress toward achieving an optimal weight will improve Outcome: Progressing   Problem: Skin Integrity: Goal: Risk for impaired skin integrity will decrease Outcome: Progressing   Problem: Tissue Perfusion: Goal: Adequacy of tissue perfusion will improve Outcome: Progressing   Problem: Education: Goal: Ability to demonstrate management of disease process will improve Outcome: Progressing Goal: Ability to verbalize understanding of medication therapies will improve Outcome: Progressing   Problem: Activity: Goal: Capacity to carry out activities will improve Outcome: Progressing   Problem: Cardiac: Goal: Ability to achieve and maintain adequate cardiopulmonary perfusion will improve Outcome: Progressing   Problem: Education: Goal: Ability to describe self-care measures that may prevent or decrease complications (Diabetes Survival Skills Education) will improve Outcome: Progressing   Problem: Coping: Goal: Ability to adjust to condition or change in health will improve Outcome: Progressing   Problem: Fluid Volume: Goal: Ability to maintain a balanced intake and output will improve Outcome: Progressing   Problem: Health Behavior/Discharge Planning: Goal: Ability to identify and utilize available resources and services will improve Outcome: Progressing Goal: Ability to manage health-related needs will improve Outcome: Progressing   Problem: Metabolic: Goal: Ability to maintain appropriate glucose  levels will improve Outcome: Progressing   Problem: Nutritional: Goal: Maintenance of adequate nutrition will improve Outcome: Progressing Goal: Progress toward achieving an optimal weight will improve Outcome: Progressing   Problem: Skin Integrity: Goal: Risk for impaired skin integrity will decrease Outcome: Progressing   Problem: Tissue Perfusion: Goal: Adequacy of tissue perfusion will improve Outcome: Progressing   Problem: Education: Goal: Understanding of CV disease, CV risk reduction, and recovery process will improve Outcome: Progressing Goal: Individualized Educational Video(s) Outcome: Progressing  Problem: Activity: Goal: Ability to return to baseline activity level will improve Outcome: Progressing

## 2024-03-31 NOTE — Progress Notes (Addendum)
   03/31/24 2230  Provider Notification  Provider Name/Title Donna Fus MD (Cardiology fellow)  Date Provider Notified 03/31/24  Time Provider Notified 2236  Method of Notification Page  Notification Reason Change in status (Hypotension requiring up titration of levo to 14mcg/min from 7mcg/min at start of shift. Dobutamine gtt discontinued at 2100 per order. SBP still low 80's and MAP <65 despite levo titration)   2252: call received from Wang MD, no new orders at this time, may consider lowering MAP goal

## 2024-03-31 NOTE — Discharge Summary (Signed)
 Advanced Heart Failure Team  Discharge Summary   Patient ID: Eduardo [name] MRN: [id number], DOB/AGE: 06-02-1956 68 y.o. Admit date: 03/24/2024 D/C date:     04/04/2024   Primary Discharge Diagnoses:  Acute on Chronic Systolic Heart Failure w/ Low Output   Secondary Discharge Diagnoses:  CAD native vessel w/o angina NSVT CKD IIIb Cholangiocarcinoma   Hospital Course:   Patient is a 68 year old gentleman with a past medical history of chronic systolic heart failure due to CAD, prior R BKA, CKD who presented for scheduled admission for IV diuresis given recent diagnosis of cholangiocarcinoma.   He was seen 5/15 by Dr. Filiberto Hug with progressive LE edema. CRT-D showed moderate volume overload. His Torsemide  40 mg bid was changed to Bumex  2 mg bid and he was advised to come to the hospital for IV diuresis and CHF optimization in the setting of discussions of surgical resection of liver mass.   He was admitted to the floor. Echo showed LVEF 20-25%, RV moderately reduced. He had poor response to escalating doses of IV diuretics. PICC was placed and showed borderline co-ox. Further diuresis limited by hypotension. Transferred to 2H for initiation of NE support and AHF team consultation.    He was placed on lasix  gtt at 30/hr but had sub optimal response initially. He was subsequently set up for RHC which demonstrated severely elevated left sided filling pressures with PCWP of 29 and reduced TD CI of 1.8. He was started on DBA w/ improvement in UOP. NE continued for BP support. Continued on lasix  gtt until diuresed. Renal function remained stable. Transitioned back to PO diuretics, torsemide  40 mg daily. He was not placed on bumex  due to difficulty obtaining from his pharmacy. Weaned off DBA and NE and placed on midodrine  to help bp. No GDMT due to hypotension.   Given recent diagnosis of cholangiocarcinoma, he was deemed not a candidate for advanced therapies. Pt had also been previously offered VAD  w/u but he declined.  He was seen by oncology during this admission. They also deemed him not a surgical candidate at this time. Can consider other treatment options including hepatic embolization and systemic therapy pending molecular testing of liver tissue biopsy and outpatient PET scan. He has follow up with Dr Scherrie Curt.    On 04/04/24 , he was last seen and examined by Dr. Bruce Caper and felt stable for discharge home from a HF standpoint.     Discharge Weight: Most recent weight 191 pounds. Diursed over 30 pounds.   Discharge Vitals: Blood pressure (!) 87/58, pulse 67, temperature (!) 97.5 F (36.4 C), temperature source Axillary, resp. rate (!) 28, height 5\' 9"  (1.753 m), weight 87 kg, SpO2 98%. General:   No resp difficulty Neck: supple. no JVD.  Cor: PMI nondisplaced. Regular rate & rhythm. No rubs, gallops or murmurs. Lungs: clear Abdomen: soft, nontender, nondistended.  Extremities: no cyanosis, clubbing, rash, edema. RBKA  Neuro: alert & oriented x3  Labs: Lab Results  Component Value Date   WBC 6.0 04/04/2024   HGB 11.2 (L) 04/04/2024   HCT 35.5 (L) 04/04/2024   MCV 84.7 04/04/2024   PLT 189 04/04/2024    Recent Labs  Lab 04/04/24 0806  NA 134*  K 3.9  CL 98  CO2 25  BUN 49*  CREATININE 1.76*  CALCIUM  8.5*  GLUCOSE 100*   Lab Results  Component Value Date   CHOL 83 10/19/2023   HDL 37 10/19/2023   LDLCALC 29 10/19/2023   TRIG  82 10/19/2023   BNP (last 3 results) Recent Labs    11/14/23 0229 03/24/24 2239  BNP 1,274.6* 4,192.8*    ProBNP (last 3 results) No results for input(s): "PROBNP" in the last 8760 hours.   Diagnostic Studies/Procedures   DG CHEST PORT 1 VIEW Result Date: 04/03/2024 CLINICAL DATA:  142230 Pleural effusion 142230 EXAM: PORTABLE CHEST 1 VIEW COMPARISON:  Chest x-ray 03/25/2024 FINDINGS: Left chest wall 2 lead pacemaker. The heart and mediastinal contours are unchanged. Atherosclerotic plaque. No focal consolidation. No  pulmonary edema. Slight interval decrease of a small to moderate left pleural effusion. No right pleural effusion. No pneumothorax. No acute osseous abnormality. IMPRESSION: 1. Slight interval decrease of a small to moderate left pleural effusion. 2.  Aortic Atherosclerosis (ICD10-I70.0). Electronically Signed   By: Morgane  Naveau M.D.   On: 04/03/2024 11:46    Discharge Medications   Allergies as of 04/04/2024       Reactions   Empagliflozin     Other Reaction(s): foot infection   Shellfish-derived Products Nausea And Vomiting, Other (See Comments)   Only Mussels cause severe nausea and vomiting   Latex Rash   Tape Rash, Other (See Comments)   Caused issues with the skin   Testosterone  Rash   did not feel well        Medication List     STOP taking these medications    bumetanide  2 MG tablet Commonly known as: BUMEX    Entresto  24-26 MG Generic drug: sacubitril -valsartan        TAKE these medications    acetaminophen  325 MG tablet Commonly known as: TYLENOL  Take 1-2 tablets (325-650 mg total) by mouth every 4 (four) hours as needed for mild pain.   amiodarone  200 MG tablet Commonly known as: PACERONE  200 mg twice a day x 7 days then 200 mg daily   clopidogrel  75 MG tablet Commonly known as: PLAVIX  Take 1 tablet (75 mg total) by mouth daily.   ESTER C PO Take 1,000 mg by mouth daily.   ezetimibe  10 MG tablet Commonly known as: ZETIA  TAKE 1 TABLET BY MOUTH DAILY   FreeStyle Libre 2 Plus Sensor Misc 1 Application.   gemfibrozil  600 MG tablet Commonly known as: LOPID  Take 600 mg by mouth 2 (two) times daily before a meal.   HumaLOG KwikPen 100 UNIT/ML KwikPen Generic drug: insulin  lispro Inject 3-10 Units into the skin 3 (three) times daily. Inject 01-1009 units into the skin three times a day, per sliding scale- based on BGL >100   HumuLIN  N KwikPen 100 UNIT/ML KwikPen Generic drug: Insulin  NPH (Human) (Isophane) Inject 6 Units into the skin daily.    Krill Oil 300 MG Caps Take 500 mg by mouth daily.   lidocaine  5 % ointment Commonly known as: XYLOCAINE  Apply 1 Application topically as needed. For pain as needed- can use up to 4x/day   Melatonin 10 MG Tabs Take 10 mg by mouth at bedtime.   midodrine  5 MG tablet Commonly known as: PROAMATINE  Take 3 tablets (15 mg total) by mouth 3 (three) times daily with meals.   Mounjaro 12.5 MG/0.5ML Pen Generic drug: tirzepatide Inject 12.5 mg into the skin once a week.   multivitamin with minerals Tabs tablet Take 1 tablet by mouth daily.   nitroGLYCERIN  0.4 MG SL tablet Commonly known as: NITROSTAT  Place 1 tablet (0.4 mg total) under the tongue every 5 (five) minutes as needed for chest pain.   PROBIOTIC-10 PO Take 1 capsule by mouth daily.  rosuvastatin  20 MG tablet Commonly known as: CRESTOR  Take 1 tablet (20 mg total) by mouth daily.   sodium chloride  0.65 % Soln nasal spray Commonly known as: OCEAN Place 1 spray into both nostrils as needed for congestion.   tamsulosin  0.4 MG Caps capsule Commonly known as: FLOMAX  Take 1 capsule (0.4 mg total) by mouth daily after supper.   torsemide  20 MG tablet Commonly known as: DEMADEX  Take 2 tablets (40 mg total) by mouth daily.   Vitamin D  50 MCG (2000 UT) Caps Take 2,000 Units by mouth daily.        Disposition   The patient will be discharged in stable condition to home. Discharge Instructions     (HEART FAILURE PATIENTS) Call MD:  Anytime you have any of the following symptoms: 1) 3 pound weight gain in 24 hours or 5 pounds in 1 week 2) shortness of breath, with or without a dry hacking cough 3) swelling in the hands, feet or stomach 4) if you have to sleep on extra pillows at night in order to breathe.   Complete by: As directed    Diet - low sodium heart healthy   Complete by: As directed    Heart Failure patients record your daily weight using the same scale at the same time of day   Complete by: As directed     Increase activity slowly   Complete by: As directed        Follow-up Information     [facility] Follow up.   Specialty: Cardiology Why: Hospital Follow Up in The Advanced  [facility] Failure [facility] at [facility], Entrance C ([facility])  04/10/24 at 9:30 AM Contact information: [address] [place]  [phone]                  Duration of Discharge Encounter: Greater than 30 minutes   Signed, Soraiya Ahner NP-C  04/04/2024, 1:29 PM

## 2024-04-01 DIAGNOSIS — I5043 Acute on chronic combined systolic (congestive) and diastolic (congestive) heart failure: Secondary | ICD-10-CM | POA: Diagnosis not present

## 2024-04-01 LAB — GLUCOSE, CAPILLARY
Glucose-Capillary: 218 mg/dL — ABNORMAL HIGH (ref 70–99)
Glucose-Capillary: 194 mg/dL — ABNORMAL HIGH (ref 70–99)
Glucose-Capillary: 92 mg/dL (ref 70–99)
Glucose-Capillary: 75 mg/dL (ref 70–99)

## 2024-04-01 LAB — BASIC METABOLIC PANEL WITH GFR
Anion gap: 13 (ref 5–15)
BUN: 38 mg/dL — ABNORMAL HIGH (ref 8–23)
CO2: 24 mmol/L (ref 22–32)
Calcium: 8.3 mg/dL — ABNORMAL LOW (ref 8.9–10.3)
Chloride: 91 mmol/L — ABNORMAL LOW (ref 98–111)
Creatinine, Ser: 1.59 mg/dL — ABNORMAL HIGH (ref 0.61–1.24)
GFR, Estimated: 47 mL/min — ABNORMAL LOW (ref >60)
Glucose, Bld: 343 mg/dL — ABNORMAL HIGH (ref 70–99)
Potassium: 3.5 mmol/L (ref 3.5–5.1)
Sodium: 128 mmol/L — ABNORMAL LOW (ref 135–145)

## 2024-04-01 LAB — COOXEMETRY PANEL
Carboxyhemoglobin: 1.1 % (ref 0.5–1.5)
Methemoglobin: <0.7 % (ref 0.0–1.5)
O2 Saturation: 79.7 %
Total hemoglobin: 11.5 g/dL — ABNORMAL LOW (ref 12.0–16.0)

## 2024-04-01 LAB — CBC
HCT: 33.8 % — ABNORMAL LOW (ref 39.0–52.0)
Hemoglobin: 10.9 g/dL — ABNORMAL LOW (ref 13.0–17.0)
MCH: 27.5 pg (ref 26.0–34.0)
MCHC: 32.2 g/dL (ref 30.0–36.0)
MCV: 85.1 fL (ref 80.0–100.0)
Platelets: 215 10*3/uL (ref 150–400)
RBC: 3.97 MIL/uL — ABNORMAL LOW (ref 4.22–5.81)
RDW: 19.5 % — ABNORMAL HIGH (ref 11.5–15.5)
WBC: 7.3 10*3/uL (ref 4.0–10.5)
nRBC: 0 % (ref 0.0–0.2)

## 2024-04-01 LAB — MAGNESIUM: Magnesium: 2.1 mg/dL (ref 1.7–2.4)

## 2024-04-01 MED ORDER — MIDODRINE HCL 5 MG PO TABS
10.0000 mg | ORAL_TABLET | Freq: Three times a day (TID) | ORAL | Status: DC
  Administered 2024-04-01 – 2024-04-02 (×6): 10 mg via ORAL
  Filled 2024-04-01 (×6): qty 2

## 2024-04-01 MED ORDER — POTASSIUM CHLORIDE CRYS ER 20 MEQ PO TBCR
40.0000 meq | EXTENDED_RELEASE_TABLET | Freq: Once | ORAL | Status: AC
  Administered 2024-04-01: 40 meq via ORAL
  Filled 2024-04-01: qty 2

## 2024-04-01 MED ORDER — AMIODARONE HCL 200 MG PO TABS
200.0000 mg | ORAL_TABLET | Freq: Two times a day (BID) | ORAL | Status: DC
  Administered 2024-04-01 – 2024-04-04 (×6): 200 mg via ORAL
  Filled 2024-04-01 (×6): qty 1

## 2024-04-01 NOTE — Progress Notes (Signed)
 Per Peder Bourdon, MD; MAP Goal >60. MD aware of 80s systolic BP. Patient A/Ox4 and asymptomatic.

## 2024-04-01 NOTE — Progress Notes (Signed)
 Patient ID: Eduardo [name], male   DOB: 06-Apr-1956, 68 y.o.   MRN: [id number]     Advanced Heart Failure Rounding Note  Cardiologist: Cody Das, MD  Chief Complaint: acute on chronic heart failure w low output    Patient Profile   Patient is a 68 year old gentleman with a past medical history of chronic systolic heart failure due to CAD, prior R BKA, CKD who presented for scheduled admission for IV diuresis given recent diagnosis of cholangiocarcinoma.    He was admitted to the floor, but had poor response to escalating doses of IV diuretics. PICC was placed and showed borderline co-ox. Further diuresis limited by hypotension.  Transferred to 2H for initiation of NE support and AHF team consultation.   Subjective/Interval Hx:    RHC 5/21: severely elevated left sided filling pressures with PCWP of 29 and reduced TD CI of 1.8. Started on DBA and later NE.   Patient is now off dobutamine but remains on NE 17 with MAP 71 most recently.  Co-ox 80% this morning.  I/Os net negative 1835.  He is back on his home bumetanide  2 mg bid. CVP < 5. Creatinine 1.7 => 1.59.   He denies dyspnea, has walked around his room.    Objective:   Weight Range: 87 kg Body mass index is 28.31 kg/m.   Vital Signs:   Temp:  [97.7 F (36.5 C)-98.2 F (36.8 C)] 98.2 F (36.8 C) (05/24 0800) Pulse Rate:  [63-116] 67 (05/24 0730) Resp:  [9-33] 15 (05/24 0730) BP: (74-127)/(41-89) 84/55 (05/24 0730) SpO2:  [74 %-100 %] 98 % (05/24 0730) Weight:  [87 kg] 87 kg (05/24 0500) Last BM Date : 03/30/24  Weight change: Filed Weights   03/30/24 0815 03/31/24 0350 04/01/24 0500  Weight: 99.4 kg 98.6 kg 87 kg    Intake/Output:   Intake/Output Summary (Last 24 hours) at 04/01/2024 0852 Last data filed at 04/01/2024 0700 Gross per 24 hour  Intake 1415.37 ml  Output 3250 ml  Net -1834.63 ml      Physical Exam   CVP <5  General: NAD Neck: No JVD, no thyromegaly or thyroid  nodule.  Lungs: Clear to  auscultation bilaterally with normal respiratory effort. CV: Nondisplaced PMI.  Heart regular S1/S2, no S3/S4, no murmur.  No peripheral edema.    Abdomen: Soft, nontender, no hepatosplenomegaly, no distention.  Skin: Intact without lesions or rashes.  Neurologic: Alert and oriented x 3.  Psych: Normal affect. Extremities: No clubbing or cyanosis.  HEENT: Normal.   Telemetry   NSR with BiV pacing (personally reviewed)  EKG    N/A   Labs    CBC Recent Labs    03/31/24 0413 04/01/24 0422  WBC 6.6 7.3  HGB 10.8* 10.9*  HCT 34.0* 33.8*  MCV 84.6 85.1  PLT 191 215   Basic Metabolic Panel Recent Labs    66/44/03 0413 04/01/24 0422  NA 130* 128*  K 3.6 3.5  CL 94* 91*  CO2 27 24  GLUCOSE 276* 343*  BUN 38* 38*  CREATININE 1.70* 1.59*  CALCIUM  8.2* 8.3*  MG 2.2 2.1   Liver Function Tests No results for input(s): "AST", "ALT", "ALKPHOS", "BILITOT", "PROT", "ALBUMIN " in the last 72 hours.  No results for input(s): "LIPASE", "AMYLASE" in the last 72 hours. Cardiac Enzymes No results for input(s): "CKTOTAL", "CKMB", "CKMBINDEX", "TROPONINI" in the last 72 hours.  BNP: BNP (last 3 results) Recent Labs    11/14/23 0229 03/24/24 2239  BNP 1,274.6* 4,192.8*    ProBNP (last 3 results) No results for input(s): "PROBNP" in the last 8760 hours.   D-Dimer No results for input(s): "DDIMER" in the last 72 hours. Hemoglobin A1C No results for input(s): "HGBA1C" in the last 72 hours. Fasting Lipid Panel No results for input(s): "CHOL", "HDL", "LDLCALC", "TRIG", "CHOLHDL", "LDLDIRECT" in the last 72 hours. Thyroid  Function Tests No results for input(s): "TSH", "T4TOTAL", "T3FREE", "THYROIDAB" in the last 72 hours.  Invalid input(s): "FREET3"  Other results:   Imaging    No results found.    Medications:     Scheduled Medications:  amiodarone   400 mg Oral BID   bumetanide   2 mg Oral BID   Chlorhexidine  Gluconate Cloth  6 each Topical Daily    cholecalciferol   2,000 Units Oral Daily   clopidogrel   75 mg Oral Daily   enoxaparin  (LOVENOX ) injection  40 mg Subcutaneous Q24H   ezetimibe   10 mg Oral Daily   guaiFENesin   600 mg Oral BID   insulin  aspart  0-20 Units Subcutaneous TID WC   insulin  aspart  0-5 Units Subcutaneous QHS   insulin  aspart  4 Units Subcutaneous TID WC   insulin  NPH Human  8 Units Subcutaneous QAC breakfast   midodrine   10 mg Oral TID WC   multivitamin with minerals  1 tablet Oral Daily   polyethylene glycol  17 g Oral Daily   rosuvastatin   20 mg Oral Daily   sodium chloride  flush  10-40 mL Intracatheter Q12H   sodium chloride  flush  3 mL Intravenous Q12H   tamsulosin  0.4 mg Oral QPC supper    Infusions:  norepinephrine  (LEVOPHED ) Adult infusion 13 mcg/min (04/01/24 0700)    PRN Medications: acetaminophen , melatonin, ondansetron  (ZOFRAN ) IV, sodium chloride  flush, sodium chloride  flush    Assessment/Plan   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 Longstanding history of severely reduced EF <20-25% since 2021 with diastolic dysfunction and relatively ok RV. - Has CRT-D - Echo this admission with EF 20-25% and new severely reduced RV, mild MR - Admitted from cardiology clinic for IV diuresis. Has had minimal response. - RHC 5/21 on 8 mcg/min of NE showed severely elevated left sided filling pressures with PCWP of 29 and reduced TD CI of 1.8. - Started on dual inotropes w/ DBA + NE, Lasix  gtt at 20/hr. Good diuresis and now off Lasix  gtt and dobutamine.  However, still on NE 17 (increased overnight) . - Will start midodrine  10 mg tid to see if we can wean NE off.  Tolerate MAP > 60. If he cannot come off NE, ?home on palliative dobutamine.  - Continue oral home diuretic regimen, Bumex  2 mg bid. CVP < 5 today.  - GDMT limited by hypotension and renal function - Previously worked up for VAD in 2022, did not want to pursue. At this point would not be a candidate for advanced therapies, goal  is to allow him some quality time out of hospital.  He is now DNR.    2. CAD - severe native three-vessel coronary artery disease (LAD 100%, p LCX all marginals occluded, RCA diffuse distal 99%)  - No chest pain.  - continue crestor , plavix , gemfibrozil , and ezetimibe    3. NSVT  - Decrease amiodarone  to 200 mg bid.  - he has ICD    4. AKI on CKD 3b - b/l Cr 1.3-1.5 - Scr 1.70 => 1.59 today, cardiorenal component  - HF optimization per above  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 Seen by Dr. Scherrie Curt this admit. Not a surgical candidate currently. Can consider other treatment options including hepatic embolization and systemic therapy pending molecular testing of liver tissue biopsy and outpatient PET  scan   He is tired of being here in the hospital. Will continue to try to wean NE as above, he wants to get home as soon as possible.     CRITICAL CARE Performed by: Peder Bourdon   Total critical care time: 35 minutes  Critical care time was exclusive of separately billable procedures and treating other patients.  Critical care was necessary to treat or prevent imminent or life-threatening deterioration.  Critical care was time spent personally by me on the following activities: development of treatment plan with patient and/or surrogate as well as nursing, discussions with consultants, evaluation of patient's response to treatment, examination of patient, obtaining history from patient or surrogate, ordering and performing treatments and interventions, ordering and review of laboratory studies, ordering and review of radiographic studies, pulse oximetry and re-evaluation of patient's condition.    Length of Stay: 8  Peder Bourdon, MD  04/01/2024, 8:52 AM  Advanced Heart Failure Team Pager [phone] (M-F; 7a - 5p)  Please contact [facility] for night-coverage after hours (5p -7a  ) and weekends on [email or web address]

## 2024-04-02 DIAGNOSIS — I5043 Acute on chronic combined systolic (congestive) and diastolic (congestive) heart failure: Secondary | ICD-10-CM | POA: Diagnosis not present

## 2024-04-02 LAB — GLUCOSE, CAPILLARY
Glucose-Capillary: 163 mg/dL — ABNORMAL HIGH (ref 70–99)
Glucose-Capillary: 157 mg/dL — ABNORMAL HIGH (ref 70–99)
Glucose-Capillary: 132 mg/dL — ABNORMAL HIGH (ref 70–99)
Glucose-Capillary: 126 mg/dL — ABNORMAL HIGH (ref 70–99)
Glucose-Capillary: 99 mg/dL (ref 70–99)
Glucose-Capillary: 73 mg/dL (ref 70–99)

## 2024-04-02 LAB — CBC
HCT: 34.9 % — ABNORMAL LOW (ref 39.0–52.0)
Hemoglobin: 11.1 g/dL — ABNORMAL LOW (ref 13.0–17.0)
MCH: 27 pg (ref 26.0–34.0)
MCHC: 31.8 g/dL (ref 30.0–36.0)
MCV: 84.9 fL (ref 80.0–100.0)
Platelets: 216 10*3/uL (ref 150–400)
RBC: 4.11 MIL/uL — ABNORMAL LOW (ref 4.22–5.81)
RDW: 19.4 % — ABNORMAL HIGH (ref 11.5–15.5)
WBC: 8.3 10*3/uL (ref 4.0–10.5)
nRBC: 0 % (ref 0.0–0.2)

## 2024-04-02 LAB — BASIC METABOLIC PANEL WITH GFR
Anion gap: 12 (ref 5–15)
BUN: 43 mg/dL — ABNORMAL HIGH (ref 8–23)
CO2: 24 mmol/L (ref 22–32)
Calcium: 8.7 mg/dL — ABNORMAL LOW (ref 8.9–10.3)
Chloride: 95 mmol/L — ABNORMAL LOW (ref 98–111)
Creatinine, Ser: 1.73 mg/dL — ABNORMAL HIGH (ref 0.61–1.24)
GFR, Estimated: 42 mL/min — ABNORMAL LOW (ref >60)
Glucose, Bld: 135 mg/dL — ABNORMAL HIGH (ref 70–99)
Potassium: 3.4 mmol/L — ABNORMAL LOW (ref 3.5–5.1)
Sodium: 131 mmol/L — ABNORMAL LOW (ref 135–145)

## 2024-04-02 LAB — COOXEMETRY PANEL
Carboxyhemoglobin: 0.7 % (ref 0.5–1.5)
Methemoglobin: <0.7 % (ref 0.0–1.5)
O2 Saturation: 62.6 %
Total hemoglobin: 11.5 g/dL — ABNORMAL LOW (ref 12.0–16.0)

## 2024-04-02 LAB — MAGNESIUM: Magnesium: 2.3 mg/dL (ref 1.7–2.4)

## 2024-04-02 MED ORDER — BUMETANIDE 2 MG PO TABS
2.0000 mg | ORAL_TABLET | Freq: Every day | ORAL | Status: DC
  Filled 2024-04-02: qty 1

## 2024-04-02 NOTE — Progress Notes (Signed)
 Patient ID: Eduardo [name], male   DOB: 12-Jan-1956, 68 y.o.   MRN: [id number]     Advanced Heart Failure Rounding Note  Cardiologist: Cody Das, MD  Chief Complaint: acute on chronic heart failure w low output    Patient Profile   Patient is a 68 year old gentleman with a past medical history of chronic systolic heart failure due to CAD, prior R BKA, CKD who presented for scheduled admission for IV diuresis given recent diagnosis of cholangiocarcinoma.    He was admitted to the floor, but had poor response to escalating doses of IV diuretics. PICC was placed and showed borderline co-ox. Further diuresis limited by hypotension.  Transferred to 2H for initiation of NE support and AHF team consultation.   Subjective/Interval Hx:    RHC 5/21: severely elevated left sided filling pressures with PCWP of 29 and reduced TD CI of 1.8. Started on DBA and later NE.   Patient is now off dobutamine.  We started midodrine  yesterday and were able to wean him off NE overnight, but NE restarted at 2 with SBP in 70s.  Co-ox 63% this morning.  I/Os net negative 1500 cc.  He is back on his home bumetanide  2 mg bid. CVP 4-5. Creatinine 1.7 => 1.59 => 1.73.   He denies dyspnea, has walked around his room. Not lightheaded.    Objective:   Weight Range: 87 kg Body mass index is 28.31 kg/m.   Vital Signs:   Temp:  [97.4 F (36.3 C)-97.7 F (36.5 C)] 97.7 F (36.5 C) (05/25 0753) Pulse Rate:  [59-117] 66 (05/25 0830) Resp:  [7-40] 19 (05/25 0830) BP: (72-161)/(40-118) 81/45 (05/25 0830) SpO2:  [81 %-100 %] 95 % (05/25 0830) Last BM Date : 04/02/24  Weight change: Filed Weights   03/30/24 0815 03/31/24 0350 04/01/24 0500  Weight: 99.4 kg 98.6 kg 87 kg    Intake/Output:   Intake/Output Summary (Last 24 hours) at 04/02/2024 0939 Last data filed at 04/02/2024 0700 Gross per 24 hour  Intake 486.67 ml  Output 2150 ml  Net -1663.33 ml      Physical Exam   CVP 4-5 General: NAD Neck:  No JVD, no thyromegaly or thyroid  nodule.  Lungs: Clear to auscultation bilaterally with normal respiratory effort. CV: Nondisplaced PMI.  Heart regular S1/S2, no S3/S4, no murmur.  No peripheral edema.   Abdomen: Soft, nontender, no hepatosplenomegaly, no distention.  Skin: Intact without lesions or rashes.  Neurologic: Alert and oriented x 3.  Psych: Normal affect. Extremities: No clubbing or cyanosis. Right BKA.  HEENT: Normal.   Telemetry   NSR with BiV pacing (personally reviewed)  EKG    N/A   Labs    CBC Recent Labs    04/01/24 0422 04/02/24 0355  WBC 7.3 8.3  HGB 10.9* 11.1*  HCT 33.8* 34.9*  MCV 85.1 84.9  PLT 215 216   Basic Metabolic Panel Recent Labs    40/98/11 0422 04/02/24 0355  NA 128* 131*  K 3.5 3.4*  CL 91* 95*  CO2 24 24  GLUCOSE 343* 135*  BUN 38* 43*  CREATININE 1.59* 1.73*  CALCIUM  8.3* 8.7*  MG 2.1 2.3   Liver Function Tests No results for input(s): "AST", "ALT", "ALKPHOS", "BILITOT", "PROT", "ALBUMIN " in the last 72 hours.  No results for input(s): "LIPASE", "AMYLASE" in the last 72 hours. Cardiac Enzymes No results for input(s): "CKTOTAL", "CKMB", "CKMBINDEX", "TROPONINI" in the last 72 hours.  BNP: BNP (last 3 results) Recent  Labs    11/14/23 0229 03/24/24 2239  BNP 1,274.6* 4,192.8*    ProBNP (last 3 results) No results for input(s): "PROBNP" in the last 8760 hours.   D-Dimer No results for input(s): "DDIMER" in the last 72 hours. Hemoglobin A1C No results for input(s): "HGBA1C" in the last 72 hours. Fasting Lipid Panel No results for input(s): "CHOL", "HDL", "LDLCALC", "TRIG", "CHOLHDL", "LDLDIRECT" in the last 72 hours. Thyroid  Function Tests No results for input(s): "TSH", "T4TOTAL", "T3FREE", "THYROIDAB" in the last 72 hours.  Invalid input(s): "FREET3"  Other results:   Imaging    No results found.    Medications:     Scheduled Medications:  amiodarone   200 mg Oral BID   bumetanide   2 mg Oral  BID   Chlorhexidine  Gluconate Cloth  6 each Topical Daily   cholecalciferol   2,000 Units Oral Daily   clopidogrel   75 mg Oral Daily   enoxaparin  (LOVENOX ) injection  40 mg Subcutaneous Q24H   ezetimibe   10 mg Oral Daily   guaiFENesin   600 mg Oral BID   insulin  aspart  0-20 Units Subcutaneous TID WC   insulin  aspart  0-5 Units Subcutaneous QHS   insulin  aspart  4 Units Subcutaneous TID WC   insulin  NPH Human  8 Units Subcutaneous QAC breakfast   midodrine   10 mg Oral TID WC   multivitamin with minerals  1 tablet Oral Daily   polyethylene glycol  17 g Oral Daily   rosuvastatin   20 mg Oral Daily   sodium chloride  flush  10-40 mL Intracatheter Q12H   sodium chloride  flush  3 mL Intravenous Q12H   tamsulosin  0.4 mg Oral QPC supper    Infusions:  norepinephrine  (LEVOPHED ) Adult infusion Stopped (04/02/24 0656)    PRN Medications: acetaminophen , melatonin, ondansetron  (ZOFRAN ) IV, sodium chloride  flush, sodium chloride  flush    Assessment/Plan   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 Longstanding history of severely reduced EF <20-25% since 2021 with diastolic dysfunction and relatively ok RV. - Has CRT-D - Echo this admission with EF 20-25% and new severely reduced RV, mild MR - Admitted from cardiology clinic for IV diuresis. Has had minimal response. - RHC 5/21 on 8 mcg/min of NE showed severely elevated left sided filling pressures with PCWP of 29 and reduced TD CI of 1.8. - Started on dual inotropes w/ DBA + NE, Lasix  gtt at 20/hr. Good diuresis and now off Lasix  gtt and dobutamine.  We were able to stop NE yesterday but back on NE 2 this morning with SBP 70s. - He got a midodrine  dose this morning and will try again to stop NE.  Tolerate MAP > 60. If he cannot come off NE, ?home on palliative dobutamine.  Would not be able to set this up until Tuesday.  - We may have dried him out a bit too much, CVP 4-5 today with creatinine higher at 1.73.  Decrease  bumetanide  to 2 mg daily (was on 2 mg bid prior to admission).   - GDMT limited by hypotension and renal function - Previously worked up for VAD in 2022, did not want to pursue. At this point would not be a candidate for advanced therapies, goal is to allow him some quality time out of hospital.  He is now DNR.    2. CAD - severe native three-vessel coronary artery disease (LAD 100%, p LCX all marginals occluded, RCA diffuse distal 99%)  - No chest pain.  - continue crestor , plavix ,  gemfibrozil , and ezetimibe    3. NSVT  - Decreased amiodarone  to 200 mg bid.  - he has ICD    4. AKI on CKD 3b - b/l Cr 1.3-1.5 - Scr 1.70 => 1.59 => 1.73 today, cardiorenal component  - Stopping evening bumetanide  dose for now as above.   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 Seen by Dr. Scherrie Curt this admit. Not a surgical candidate currently. Can consider other treatment options including hepatic embolization and systemic therapy pending molecular testing of liver tissue biopsy and outpatient PET  scan   He is tired of being here in the hospital. Will continue to try to wean NE as above, he wants to get home as soon as possible.     CRITICAL CARE Performed by: Peder Bourdon  Total critical care time: 35 minutes  Critical care time was exclusive of separately billable procedures and treating other patients.  Critical care was necessary to treat or prevent imminent or life-threatening deterioration.  Critical care was time spent personally by me on the following activities: development of treatment plan with patient and/or surrogate as well as nursing, discussions with consultants, evaluation of patient's response to treatment, examination of patient, obtaining history from patient or surrogate, ordering and performing treatments and interventions, ordering and review of laboratory studies, ordering and review of  radiographic studies, pulse oximetry and re-evaluation of patient's condition.    Length of Stay: 9  Peder Bourdon, MD  04/02/2024, 9:39 AM  Advanced Heart Failure Team Pager [phone] (M-F; 7a - 5p)  Please contact [facility] for night-coverage after hours (5p -7a ) and weekends on [email or web address]

## 2024-04-03 ENCOUNTER — Inpatient Hospital Stay (HOSPITAL_COMMUNITY)

## 2024-04-03 DIAGNOSIS — I5043 Acute on chronic combined systolic (congestive) and diastolic (congestive) heart failure: Secondary | ICD-10-CM | POA: Diagnosis not present

## 2024-04-03 LAB — GLUCOSE, CAPILLARY
Glucose-Capillary: 76 mg/dL (ref 70–99)
Glucose-Capillary: 184 mg/dL — ABNORMAL HIGH (ref 70–99)
Glucose-Capillary: 163 mg/dL — ABNORMAL HIGH (ref 70–99)
Glucose-Capillary: 262 mg/dL — ABNORMAL HIGH (ref 70–99)
Glucose-Capillary: 208 mg/dL — ABNORMAL HIGH (ref 70–99)

## 2024-04-03 LAB — COOXEMETRY PANEL
Carboxyhemoglobin: 1.3 % (ref 0.5–1.5)
Methemoglobin: <0.7 % (ref 0.0–1.5)
O2 Saturation: 66.3 %
Total hemoglobin: 10.4 g/dL — ABNORMAL LOW (ref 12.0–16.0)

## 2024-04-03 LAB — CBC
HCT: 34.8 % — ABNORMAL LOW (ref 39.0–52.0)
Hemoglobin: 10.9 g/dL — ABNORMAL LOW (ref 13.0–17.0)
MCH: 26.7 pg (ref 26.0–34.0)
MCHC: 31.3 g/dL (ref 30.0–36.0)
MCV: 85.3 fL (ref 80.0–100.0)
Platelets: 167 10*3/uL (ref 150–400)
RBC: 4.08 MIL/uL — ABNORMAL LOW (ref 4.22–5.81)
RDW: 19.5 % — ABNORMAL HIGH (ref 11.5–15.5)
WBC: 6.2 10*3/uL (ref 4.0–10.5)
nRBC: 0 % (ref 0.0–0.2)

## 2024-04-03 LAB — BASIC METABOLIC PANEL WITH GFR
Anion gap: 12 (ref 5–15)
BUN: 44 mg/dL — ABNORMAL HIGH (ref 8–23)
CO2: 23 mmol/L (ref 22–32)
Calcium: 8.5 mg/dL — ABNORMAL LOW (ref 8.9–10.3)
Chloride: 99 mmol/L (ref 98–111)
Creatinine, Ser: 1.73 mg/dL — ABNORMAL HIGH (ref 0.61–1.24)
GFR, Estimated: 42 mL/min — ABNORMAL LOW (ref >60)
Glucose, Bld: 99 mg/dL (ref 70–99)
Potassium: 3.4 mmol/L — ABNORMAL LOW (ref 3.5–5.1)
Sodium: 134 mmol/L — ABNORMAL LOW (ref 135–145)

## 2024-04-03 MED ORDER — MIDODRINE HCL 5 MG PO TABS
15.0000 mg | ORAL_TABLET | Freq: Three times a day (TID) | ORAL | Status: DC
  Administered 2024-04-03 – 2024-04-04 (×4): 15 mg via ORAL
  Filled 2024-04-03 (×4): qty 3

## 2024-04-03 MED ORDER — POTASSIUM CHLORIDE CRYS ER 20 MEQ PO TBCR
40.0000 meq | EXTENDED_RELEASE_TABLET | Freq: Once | ORAL | Status: AC
  Administered 2024-04-03: 40 meq via ORAL
  Filled 2024-04-03: qty 2

## 2024-04-03 NOTE — Progress Notes (Signed)
 Patient ID: Eduardo [name], male   DOB: [birth year], 68 y.o.   MRN: [id number]     Advanced Heart Failure Rounding Note  Cardiologist: Cody Das, MD  Chief Complaint: acute on chronic heart failure w low output    Patient Profile   Patient is a 68 year old gentleman with a past medical history of chronic systolic heart failure due to CAD, prior R BKA, CKD who presented for scheduled admission for IV diuresis given recent diagnosis of cholangiocarcinoma.    He was admitted to the floor, but had poor response to escalating doses of IV diuretics. PICC was placed and showed borderline co-ox. Further diuresis limited by hypotension.  Transferred to 2H for initiation of NE support and AHF team consultation.   Subjective/Interval Hx:    RHC 5/21: severely elevated left sided filling pressures with PCWP of 29 and reduced TD CI of 1.8. Started on DBA and later NE.   Remains on norepi 2 mcg . CO-OX 66%   Wants to go home. Denies SOB. Able to walk around the unit.   Objective:   Weight Range: 87 kg Body mass index is 28.31 kg/m.   Vital Signs:   Temp:  [97.6 F (36.4 C)-98 F (36.7 C)] 97.6 F (36.4 C) (05/26 0701) Pulse Rate:  [52-110] 60 (05/26 0701) Resp:  [0-31] 18 (05/26 0701) BP: (73-103)/(40-79) 91/49 (05/26 0701) SpO2:  [79 %-100 %] 93 % (05/26 0701) Last BM Date : 04/02/24  Weight change: Filed Weights   03/30/24 0815 03/31/24 0350 04/01/24 0500  Weight: 99.4 kg 98.6 kg 87 kg    Intake/Output:   Intake/Output Summary (Last 24 hours) at 04/03/2024 0815 Last data filed at 04/03/2024 0750 Gross per 24 hour  Intake 104.11 ml  Output 1775 ml  Net -1670.89 ml    CVP 2-3   Physical Exam   General:   No resp difficulty Neck: supple. no JVD.  Cor: PMI nondisplaced. Regular rate & rhythm. No rubs, gallops or murmurs. Lungs: clear Abdomen: soft, nontender, nondistended.  Extremities: no cyanosis, clubbing, rash, edema. RBKA Neuro: alert & oriented  x3  Telemetry   SR Biv Pacing.   EKG    N/A   Labs    CBC Recent Labs    04/02/24 0355 04/03/24 0518  WBC 8.3 6.2  HGB 11.1* 10.9*  HCT 34.9* 34.8*  MCV 84.9 85.3  PLT 216 167   Basic Metabolic Panel Recent Labs    78/29/56 0422 04/02/24 0355 04/03/24 0518  NA 128* 131* 134*  K 3.5 3.4* 3.4*  CL 91* 95* 99  CO2 24 24 23   GLUCOSE 343* 135* 99  BUN 38* 43* 44*  CREATININE 1.59* 1.73* 1.73*  CALCIUM  8.3* 8.7* 8.5*  MG 2.1 2.3  --    Liver Function Tests No results for input(s): "AST", "ALT", "ALKPHOS", "BILITOT", "PROT", "ALBUMIN " in the last 72 hours.  No results for input(s): "LIPASE", "AMYLASE" in the last 72 hours. Cardiac Enzymes No results for input(s): "CKTOTAL", "CKMB", "CKMBINDEX", "TROPONINI" in the last 72 hours.  BNP: BNP (last 3 results) Recent Labs    11/14/23 0229 03/24/24 2239  BNP 1,274.6* 4,192.8*    ProBNP (last 3 results) No results for input(s): "PROBNP" in the last 8760 hours.   D-Dimer No results for input(s): "DDIMER" in the last 72 hours. Hemoglobin A1C No results for input(s): "HGBA1C" in the last 72 hours. Fasting Lipid Panel No results for input(s): "CHOL", "HDL", "LDLCALC", "TRIG", "CHOLHDL", "LDLDIRECT" in the  last 72 hours. Thyroid  Function Tests No results for input(s): "TSH", "T4TOTAL", "T3FREE", "THYROIDAB" in the last 72 hours.  Invalid input(s): "FREET3"  Other results:   Imaging    No results found.    Medications:     Scheduled Medications:  amiodarone   200 mg Oral BID   bumetanide   2 mg Oral Daily   Chlorhexidine  Gluconate Cloth  6 each Topical Daily   cholecalciferol   2,000 Units Oral Daily   clopidogrel   75 mg Oral Daily   enoxaparin  (LOVENOX ) injection  40 mg Subcutaneous Q24H   ezetimibe   10 mg Oral Daily   guaiFENesin   600 mg Oral BID   insulin  aspart  0-20 Units Subcutaneous TID WC   insulin  aspart  0-5 Units Subcutaneous QHS   insulin  aspart  4 Units Subcutaneous TID WC   insulin   NPH Human  8 Units Subcutaneous QAC breakfast   midodrine   10 mg Oral TID WC   multivitamin with minerals  1 tablet Oral Daily   polyethylene glycol  17 g Oral Daily   rosuvastatin   20 mg Oral Daily   sodium chloride  flush  10-40 mL Intracatheter Q12H   sodium chloride  flush  3 mL Intravenous Q12H   tamsulosin  0.4 mg Oral QPC supper    Infusions:  norepinephrine  (LEVOPHED ) Adult infusion 2 mcg/min (04/03/24 0700)    PRN Medications: acetaminophen , melatonin, ondansetron  (ZOFRAN ) IV, sodium chloride  flush, sodium chloride  flush    Assessment/Plan   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 Longstanding history of severely reduced EF <20-25% since 2021 with diastolic dysfunction and relatively ok RV. - Has CRT-D - Echo this admission with EF 20-25% and new severely reduced RV, mild MR - Admitted from cardiology clinic for IV diuresis. Has had minimal response. - RHC 5/21 on 8 mcg/min of NE showed severely elevated left sided filling pressures with PCWP of 29 and reduced TD CI of 1.8. - CVP 2-3. Hold bumex  today. Diuresed > 30 pounds.  - Wean Norepi. Increase midodrine  to 15 mg tid. CO-OX stable 66%  - GDMT limited by hypotension and renal function - Previously worked up for VAD in 2022, did not want to pursue. At this point would not be a candidate for advanced therapies, goal is to allow him some quality time out of hospital.  He is now DNR.    2. CAD - severe native three-vessel coronary artery disease (LAD 100%, p LCX all marginals occluded, RCA diffuse distal 99%)  - No chest pain.  - continue crestor , plavix , gemfibrozil , and ezetimibe    3. NSVT  - Continue amiodarone  to 200 mg bid.  - he has ICD    4. AKI on CKD 3b - b/l Cr 1.3-1.5 - Scr 1.70>`1.7  cardiorenal component  - Hold bumex . CVP low.   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 Seen by Dr. Scherrie Curt this  admit. Not a surgical candidate currently. Can consider other treatment options including hepatic embolization and systemic therapy pending molecular testing of liver tissue biopsy and outpatient PET  scan     CRITICAL CARE Performed by: Nieves Bars  Total critical care time: 10 minutes  Critical care time was exclusive of separately billable procedures and treating other patients.  Critical care was necessary to treat or prevent imminent or life-threatening deterioration.  Critical care was time spent personally by me on the following activities: development of treatment plan with patient  and/or surrogate as well as nursing, discussions with consultants, evaluation of patient's response to treatment, examination of patient, obtaining history from patient or surrogate, ordering and performing treatments and interventions, ordering and review of laboratory studies, ordering and review of radiographic studies, pulse oximetry and re-evaluation of patient's condition.    Length of Stay: 10  Nieves Bars, NP  04/03/2024, 8:15 AM  Advanced Heart Failure Team Pager [phone] (M-F; 7a - 5p)  Please contact [facility] for night-coverage after hours (5p -7a ) and weekends on [email or web address]

## 2024-04-04 ENCOUNTER — Other Ambulatory Visit (HOSPITAL_COMMUNITY): Payer: Self-pay

## 2024-04-04 DIAGNOSIS — I5043 Acute on chronic combined systolic (congestive) and diastolic (congestive) heart failure: Secondary | ICD-10-CM | POA: Diagnosis not present

## 2024-04-04 LAB — CBC
HCT: 35.5 % — ABNORMAL LOW (ref 39.0–52.0)
Hemoglobin: 11.2 g/dL — ABNORMAL LOW (ref 13.0–17.0)
MCH: 26.7 pg (ref 26.0–34.0)
MCHC: 31.5 g/dL (ref 30.0–36.0)
MCV: 84.7 fL (ref 80.0–100.0)
Platelets: 189 10*3/uL (ref 150–400)
RBC: 4.19 MIL/uL — ABNORMAL LOW (ref 4.22–5.81)
RDW: 19.3 % — ABNORMAL HIGH (ref 11.5–15.5)
WBC: 6 10*3/uL (ref 4.0–10.5)
nRBC: 0 % (ref 0.0–0.2)

## 2024-04-04 LAB — BASIC METABOLIC PANEL WITH GFR
Anion gap: 11 (ref 5–15)
BUN: 49 mg/dL — ABNORMAL HIGH (ref 8–23)
CO2: 25 mmol/L (ref 22–32)
Calcium: 8.5 mg/dL — ABNORMAL LOW (ref 8.9–10.3)
Chloride: 98 mmol/L (ref 98–111)
Creatinine, Ser: 1.76 mg/dL — ABNORMAL HIGH (ref 0.61–1.24)
GFR, Estimated: 42 mL/min — ABNORMAL LOW (ref >60)
Glucose, Bld: 100 mg/dL — ABNORMAL HIGH (ref 70–99)
Potassium: 3.9 mmol/L (ref 3.5–5.1)
Sodium: 134 mmol/L — ABNORMAL LOW (ref 135–145)

## 2024-04-04 LAB — COOXEMETRY PANEL
Carboxyhemoglobin: 0.9 % (ref 0.5–1.5)
Methemoglobin: <0.7 % (ref 0.0–1.5)
O2 Saturation: 50.9 %
Total hemoglobin: 11.6 g/dL — ABNORMAL LOW (ref 12.0–16.0)

## 2024-04-04 LAB — GLUCOSE, CAPILLARY
Glucose-Capillary: 101 mg/dL — ABNORMAL HIGH (ref 70–99)
Glucose-Capillary: 134 mg/dL — ABNORMAL HIGH (ref 70–99)

## 2024-04-04 MED ORDER — LACTATED RINGERS IV BOLUS
250.0000 mL | Freq: Once | INTRAVENOUS | Status: AC
  Administered 2024-04-04: 250 mL via INTRAVENOUS

## 2024-04-04 MED ORDER — TORSEMIDE 20 MG PO TABS
40.0000 mg | ORAL_TABLET | Freq: Every day | ORAL | 6 refills | Status: DC
  Filled 2024-04-04: qty 60, 30d supply, fill #0

## 2024-04-04 MED ORDER — AMIODARONE HCL 200 MG PO TABS
ORAL_TABLET | ORAL | 6 refills | Status: DC
  Filled 2024-04-04: qty 35, 28d supply, fill #0
  Filled 2024-05-01: qty 30, 30d supply, fill #1

## 2024-04-04 MED ORDER — MIDODRINE HCL 5 MG PO TABS
15.0000 mg | ORAL_TABLET | Freq: Three times a day (TID) | ORAL | 6 refills | Status: DC
  Filled 2024-04-04: qty 270, 30d supply, fill #0

## 2024-04-04 MED ORDER — TAMSULOSIN HCL 0.4 MG PO CAPS
0.4000 mg | ORAL_CAPSULE | Freq: Every day | ORAL | 6 refills | Status: DC
  Filled 2024-04-04: qty 30, 30d supply, fill #0
  Filled 2024-05-01: qty 30, 30d supply, fill #1
  Filled 2024-06-07 – 2024-06-08 (×2): qty 30, 30d supply, fill #0

## 2024-04-04 NOTE — Progress Notes (Signed)
 ADVANCED HF CLINIC NOTE  Referring Physician: Roosvelt Colla, MD Primary Care: Roosvelt Colla, MD Primary Cardiologist: Cody Das, MD Heart Failure Cardiologist: Jules Oar, MD  Chief Complaint:  HPI: Eduardo [name] is 68 y.o. male with severe 3V CAD being managed with medical therapy, R SFA occlusion s/p PTA with 3 overlapping DES, DM2 s/p right 5th toe amputation, CKD IIIa, HTN, HLD and systolic HF due to iCM.   Presented to [facility] 12/3 with acute hypoxic respiratory failure due to pulmonary edema. Intubated in ER. He was found to have new LBBB with pulmonary edema and code STEMI called. He was taken to cath lab found to have GHK, EF 15-20%, LVEDP 29 and severe native three-vessel coronary artery  disease (LAD 100%p LCX all marginals occluded< RCA diffuse distal 99%) - with no obvious culprit lesion and no targets for revascularization. Hstrop peaked at 27k. Admitted to CCU for management of cardiogenic shock. Started on [place].  Echo EF 20-25% RV normal. He stabilized relatively quickly and was discharged home.    Seen in VAD Clinic 1/22 and decided against VAD.   Readmitted 12/28/20 with ADHF. EF 15%. Went for cath with Dr. Filiberto Hug and Dr. Berry Bristol. Cath with 100% LAD. OM-1 100% Distal RCA 99% Attempted CTO of LAD with Impella support but unsuccessful.  Of note, recently underwent small bowel resection and anastomosis for small bowel obstruction where liver mass was noted. He underwent biopsy which showed poorly differentiated carcinoma, most consistent with cholangiocarcinoma.    He was seen 5/15 by Dr. Filiberto Hug with progressive LE edema. CRT-D showed moderate volume overload. His Torsemide  40 mg bid was changed to Bumex  2 mg bid and he was advised to come to the hospital for IV diuresis and CHF optimization in the setting of discussions of surgical resection of liver mass. His case is being discussed at GI tumor conference.    Admitted 5/25 with acute on chronic biventricular HF. Echo  showed EF 20-25% and new severely reduced RV, mild MR. Diuresis suboptimal and limited with hypotension. AHF team consulted. RHC showed severely elevated left sided filling pressures with PCWP of 29 and reduced TD CI of 1.8. He was started on DBA w/ improvement in UOP. Started on NE for BP support. Once diuresed he was transitioned to PO diuretics and NE/DBA weaned off. Required midodrine  to fully wean off inotrope's. No GDMT with soft BP. With recent diagnosis of cholangiocarcinoma, he was deemed not a candidate for VAD and he previously declined.   Today he returns for AHF follow up with his significant other. Overall feeling fatigued. Denies palpitations, CP, dizziness, or PND/Orthopnea. 2 days after discharge he noticed the swelling return to his LLE, has been wearing compression socks and maintaining leg elevated. No SOB. Appetite ok, watches what he eats. No fever or chills. Weight at home 220 pounds. Taking all medications. Denies ETOH, tobacco or drug use.    Past Medical History:  Diagnosis Date   CHF (congestive heart failure) (HCC)    Chronic kidney disease    stage 3   Colon polyp    Coronary artery disease    Diabetes mellitus 1987   under care of Dr. [name].  On insulin  since 96 (off and on)   Diabetic retinopathy    Dupuytren contracture    R hand, s/p injection (Dr. Jonna Netter)   Essential hypertension, benign    Essential hypertension, benign 02/06/2019   Frequency of urination and polyuria    Hypertension    Myocardial  infarction [facility])    denies   Neuromuscular disorder (HCC)    Diabetic neuropathy   Osteomyelitis (HCC)    right foot   Other testicular hypofunction    Peripheral arterial disease (HCC) 10/28/2012   Peritoneal abscess (HCC) 6/08   and buttock.   Pneumonia    Polydipsia    Proteinuria    Pure hyperglyceridemia    Subacute osteomyelitis, right ankle and foot (HCC)    Wears glasses     Current Outpatient Medications  Medication Sig Dispense Refill    acetaminophen  (TYLENOL ) 325 MG tablet Take 1-2 tablets (325-650 mg total) by mouth every 4 (four) hours as needed for mild pain.     amiodarone  (PACERONE ) 200 MG tablet Take 1 tablet (200 mg total) by mouth 2 (two) times daily for 7 days, THEN 1 tablet (200 mg total) daily thereafter 35 tablet 6   Bioflavonoid Products (ESTER C PO) Take 1,000 mg by mouth daily.     Cholecalciferol  (VITAMIN D ) 2000 units CAPS Take 2,000 Units by mouth daily.     clopidogrel  (PLAVIX ) 75 MG tablet Take 1 tablet (75 mg total) by mouth daily. 90 tablet 2   Continuous Glucose Sensor (FREESTYLE LIBRE 2 PLUS SENSOR) MISC 1 Application.     ezetimibe  (ZETIA ) 10 MG tablet TAKE 1 TABLET BY MOUTH DAILY 90 tablet 3   gemfibrozil  (LOPID ) 600 MG tablet Take 600 mg by mouth 2 (two) times daily before a meal.     HUMALOG KWIKPEN 100 UNIT/ML KiwkPen Inject 3-10 Units into the skin 3 (three) times daily. Inject 01-1009 units into the skin three times a day, per sliding scale- based on BGL >100  1   HUMULIN  N KWIKPEN 100 UNIT/ML KwikPen Inject 6 Units into the skin daily.     Krill Oil 300 MG CAPS Take 500 mg by mouth daily.     lidocaine  (XYLOCAINE ) 5 % ointment Apply 1 Application topically as needed. For pain as needed- can use up to 4x/day 50 g 5   melatonin 10 MG TABS Take 10 mg by mouth at bedtime.  0   midodrine  (PROAMATINE ) 5 MG tablet Take 3 tablets (15 mg total) by mouth 3 (three) times daily with meals. 270 tablet 6   Multiple Vitamin (MULTIVITAMIN WITH MINERALS) TABS tablet Take 1 tablet by mouth daily.     nitroGLYCERIN  (NITROSTAT ) 0.4 MG SL tablet Place 1 tablet (0.4 mg total) under the tongue every 5 (five) minutes as needed for chest pain. 15 tablet 3   Probiotic Product (PROBIOTIC-10 PO) Take 1 capsule by mouth daily.     rosuvastatin  (CRESTOR ) 20 MG tablet Take 1 tablet (20 mg total) by mouth daily. 90 tablet 3   sodium chloride  (OCEAN) 0.65 % SOLN nasal spray Place 1 spray into both nostrils as needed for congestion.      tamsulosin  (FLOMAX ) 0.4 MG CAPS capsule Take 1 capsule (0.4 mg total) by mouth daily after supper. 30 capsule 6   tirzepatide (MOUNJARO) 12.5 MG/0.5ML Pen Inject 12.5 mg into the skin once a week.     torsemide  (DEMADEX ) 20 MG tablet Take 2 tablets (40 mg total) by mouth daily. (Patient taking differently: Take 40 mg by mouth daily. Take 2 tablets in the mornings and 1 tablet at night) 60 tablet 6   No current facility-administered medications for this encounter.    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 Retired. 1 cat.  Girlfriend and her daughter lives with him. Enjoys fishing.   Updated 06/2021   Social Drivers of Health   Financial Resource Strain: Low Risk  (07/28/2023)   Overall Financial Resource Strain (CARDIA)     Difficulty of Paying Living Expenses: Not hard at all  Food Insecurity: No Food Insecurity (03/24/2024)   Hunger Vital Sign    Worried About Running Out of Food in the Last Year: Never true    Ran Out of Food in the Last Year: Never true  Transportation Needs: No Transportation Needs (03/24/2024)   PRAPARE - Administrator, Civil Service (Medical): No    Lack of Transportation (Non-Medical): No  Physical Activity: Sufficiently Active (07/28/2023)   Exercise Vital Sign    Days of Exercise per Week: 7 days    Minutes of Exercise per Session: 90 min  Stress: No Stress Concern Present (07/28/2023)   [employer] of Occupational Health - Occupational Stress Questionnaire    Feeling of Stress : Not at all  Social Connections: Socially Isolated (03/24/2024)   Social Connection and Isolation Panel [NHANES]    Frequency of Communication with Friends and Family: Never    Frequency of Social Gatherings with Friends and Family: Never    Attends Religious Services: Never    Database administrator or Organizations: No    Attends Banker Meetings: Never    Marital Status: Living with partner  Intimate Partner Violence: Not At Risk (03/24/2024)   Humiliation, Afraid, Rape, and Kick questionnaire    Fear of Current or Ex-Partner: No    Emotionally Abused: No    Physically Abused: No    Sexually Abused: No      Family History  Problem Relation Age of Onset   Diabetes Mother    Hearing loss Mother    Hypertension Mother    Hyperlipidemia Mother    Heart disease Mother    Varicose Veins Mother    Varicose Veins Father    Dementia Father    Hyperlipidemia Brother    Diabetes Maternal Grandmother     Vitals:   04/10/24  0935  BP: 100/68  Pulse: 85  SpO2: 99%  Weight: 100.7 kg (222 lb)  Height: 5\' 9"  (1.753 m)    PHYSICAL EXAM: General:  well appearing.  No respiratory difficulty Neck: supple. JVD ~14 cm.  Cor: PMI nondisplaced. Regular rate & rhythm. No  rubs, gallops or murmurs. Lungs: clear. LLL diminished.  Extremities: no cyanosis, clubbing, rash, edema. RLE prosthetic. +2-3 LLE edema Neuro: alert & oriented x 3. Moves all 4 extremities w/o difficulty. Affect pleasant.   Wt Readings from Last 3 Encounters:  04/10/24 100.7 kg (222 lb)  04/01/24 87 kg (191 lb 11.2 oz)  03/24/24 103.6 kg (228 lb 6.4 oz)    ECG: a sensed v paced 80 bpm with PVCs. QTc 509 (Personally reviewed)    St Jude ICD device interrogation: CorVue WNL. NSVT 03/16/24 10 seconds.   ReDs reading: 42 %, abnormal   ASSESSMENT & PLAN: 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 Longstanding history of severely reduced EF <20-25% since 2021 with diastolic dysfunction and relatively ok RV. - Has CRT-D - Echo 5/25 EF 20-25% and new severely reduced RV, mild MR - RHC 5/21 on 8 mcg/min of NE showed severely elevated left sided filling pressures with PCWP of 29 and reduced TD CI of 1.8. - NYHA IIIa, symptomatically feels much better that before last admission aside from ongoing fatigue.  - Diuresed > 30 pounds recently, now back up 31 lbs. Volume overloaded on exam but not by device interrogation. ReDS clip elevated.  - Increase Torsemide  60 mg daily> 40 mg BID - He will take metolazone 2.5 today and tomorrow + 40 mEq for those 2 days.  - Continue midodrine  to 15 mg tid. Plan to wean as able - GDMT limited by hypotension and renal function - Previously worked up for VAD in 2022, did not want to pursue. At this point would not be a candidate for advanced therapies, goal is to allow him some quality time out of hospital.  He is now DNR.    2. CAD - severe native three-vessel coronary artery disease (LAD 100%, p LCX all marginals occluded, RCA diffuse distal 99%)  - No chest pain.  - continue crestor , plavix , gemfibrozil , and ezetimibe    3. NSVT  - Continue amiodarone  to 200 mg bid.  - he has ICD    4. CKD 3b - b/l Cr 1.3-1.5 - Scr 1.70>`1.7, cardiorenal component     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 Seen by Dr. Scherrie Curt this admit. Not a surgical candidate currently. Can consider other treatment options including hepatic embolization and systemic therapy pending molecular testing of liver tissue biopsy and outpatient PET  scan   Follow up next week with APP to reassess volume. I asked him to weight daily and keep a log. Will need ReDs at f/u.   Sheryl Donna AGACNP-BC  Advanced Heart Failure Clinic [facility] [address] Heart and Vascular [place] [address] [phone] (office) [phone] (fax)

## 2024-04-04 NOTE — Progress Notes (Signed)
 CVAD removed per protocol per MD order. Manual pressure applied for 3 mins. Vaseline gauze, gauze, and Tegaderm applied over insertion site. No bleeding or swelling noted. Instructed patient to remain in bed for thirty mins. Educated patient about S/S of infection and when to call MD; no heavy lifting or pressure on R side for 24 hours; keep dressing dry and intact for 24 hours. Pt verbalized comprehension.

## 2024-04-04 NOTE — TOC Initial Note (Signed)
 Transition of Care [facility]) - Initial/Assessment Note    Patient Details  Name: Eduardo [name] MRN: [id number] Date of Birth: 02-02-1956  Transition of Care [facility]) CM/SW Contact:    Benjiman Bras, RN Phone Number:[phone] 04/04/2024, 2:50 PM  Clinical Narrative:                 TOC CM spoke to pt and no HH needed at this time. Pt states his SO assist him with care. Has needed DME at home. Wife will provide transport to home.   PCP appt scheduled for 6/2 at 1145 am. Pt states he may have to reschedule appt.   Expected Discharge Plan: Home/Self Care     Patient Goals and CMS Choice Patient states their goals for this hospitalization and ongoing recovery are:: wants to remain independent          Expected Discharge Plan and Services   Discharge Planning Services: CM Consult   Living arrangements for the past 2 months: Single Family Home Expected Discharge Date: 04/04/24                                    Prior Living Arrangements/Services Living arrangements for the past 2 months: Single Family Home Lives with:: Spouse Patient language and need for interpreter reviewed:: Yes Do you feel safe going back to the place where you live?: Yes      Need for Family Participation in Patient Care: Yes (Comment) Care giver support system in place?: Yes (comment) Current home services: DME (wheelchair, rolling walker, scale) Criminal Activity/Legal Involvement Pertinent to Current Situation/Hospitalization: No - Comment as needed  Activities of Daily Living   ADL Screening (condition at time of admission) Independently performs ADLs?: Yes (appropriate for developmental age) Is the patient deaf or have difficulty hearing?: No Does the patient have difficulty seeing, even when wearing glasses/contacts?: No Does the patient have difficulty concentrating, remembering, or making decisions?: No  Permission Sought/Granted Permission sought to share information with :  Family Supports, PCP, Case Manager Permission granted to share information with : Yes, Verbal Permission Granted  Share Information with NAME: Maryln Sober  Permission granted to share info w AGENCY: PCP  Permission granted to share info w Relationship: SO  Permission granted to share info w Contact Information: [phone]  Emotional Assessment Appearance:: Appears stated age Attitude/Demeanor/Rapport: Engaged Affect (typically observed): Accepting Orientation: : Oriented to Self, Oriented to Place, Oriented to  Time, Oriented to Situation   Psych Involvement: No (comment)  Admission diagnosis:  Heart failure (HCC) [I50.9] Patient Active Problem List   Diagnosis Date Noted   Heart failure (HCC) 03/24/2024   Small bowel obstruction (HCC) 11/14/2023   Small bowel obstruction due to adhesions (HCC) 11/14/2023   ICD (implantable cardioverter-defibrillator) in place 02/04/2023   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   Chronic post-operative pain 09/18/2022   Abnormality of gait 09/18/2022   LBBB (left bundle branch block) 08/19/2022   Chronic systolic heart failure (HCC) 08/19/2022   Chronic right-sided congestive heart failure (HCC) 05/18/2022   Ejection fraction < 50% 04/17/2022   Insomnia    Constipation    S/P BKA (below knee amputation), right (HCC) 03/21/2022   Ischemic cardiomyopathy    AKI (acute kidney injury) (HCC) 03/13/2022   Streptococcal bacteremia 03/13/2022   Hypophosphatemia 03/13/2022   Septic shock (HCC) 03/08/2022   Acute systolic heart failure (HCC) 12/28/2020  Acute pulmonary edema (HCC) 10/11/2020   Non-ST elevation (NSTEMI) myocardial infarction (HCC) 10/11/2020   Acute respiratory distress 10/11/2020   Acute on chronic combined systolic and diastolic CHF (congestive heart failure) (HCC) 10/11/2020   Cardiogenic shock (HCC)    Partial nontraumatic amputation of foot, right (HCC) 06/21/2019   Osteomyelitis of right foot  (HCC)    Essential hypertension, benign 02/06/2019   Hypercholesteremia 02/06/2019   Stage 3b chronic kidney disease (CKD) (HCC) 10/06/2017   Proteinuria 10/06/2017   Controlled type 2 diabetes mellitus with hyperglycemia, with long-term current use of insulin  (HCC) 10/06/2017   Coronary artery disease involving coronary bypass graft of native heart without angina pectoris 05/23/2017   Abnormal nuclear stress test 09/06/2016   PAD (peripheral artery disease) (HCC) 10/28/2012   Morbid obesity with BMI of 40.0-44.9, adult (HCC) 10/13/2012   Diabetes mellitus with ophthalmic complication (HCC) 10/13/2012   Hypertension associated with diabetes (HCC) 10/13/2012   Atherosclerosis of native artery of extremity with intermittent claudication (HCC) 04/05/2012   Adenomatous colon polyp 09/28/2011   Diabetes mellitus (HCC) 09/28/2011   Pure hyperglyceridemia 09/28/2011   Erectile dysfunction 09/28/2011   Diabetic retinopathy (HCC) 09/28/2011   PCP:  Roosvelt Colla, MD Pharmacy:   [facility] [phone] - [place], [place] - [address] AT [facility] [address] & [facility] [address] [place] [address] Phone: [phone] Fax: [phone]  [facility]) - [place], [place] - [address] [facility] [address] [place] [address] [place] [address] Phone: [phone] Fax: [phone]   [facility] - [place], [place] - [address] [place]  [address] Phone: [phone] Fax: [phone]  [facility] [address] [place] [address] Phone: [phone] Fax: [phone]     Social Drivers of Health (SDOH) Social History: SDOH Screenings   Food Insecurity: No Food Insecurity (03/24/2024)  Housing: Low Risk  (03/24/2024)  Transportation Needs: No Transportation Needs (03/24/2024)  Utilities: Not At Risk (03/24/2024)  Depression (PHQ2-9): Low Risk   (02/22/2024)  Financial Resource Strain: Low Risk  (07/28/2023)  Physical Activity: Sufficiently Active (07/28/2023)  Social Connections: Socially Isolated (03/24/2024)  Stress: No Stress Concern Present (07/28/2023)  Tobacco Use: Medium Risk (03/25/2024)   SDOH Interventions:     Readmission Risk Interventions    03/28/2024   10:33 AM  Readmission Risk Prevention Plan  Transportation Screening Complete  Home Care Screening Complete  Medication Review (RN CM) Complete

## 2024-04-05 ENCOUNTER — Telehealth: Payer: Self-pay

## 2024-04-05 NOTE — Transitions of Care (Post Inpatient/ED Visit) (Signed)
   04/05/2024  Name: Eduardo [name] MRN: [id number] DOB: January 23, 1956  Today's TOC FU Call Status: Today's TOC FU Call Status:: Unsuccessful Call (1st Attempt) Unsuccessful Call (1st Attempt) Date: 04/05/24  Attempted to reach the patient regarding the most recent Inpatient/ED visit.Left generic, but confidential voice mail on DPR/patient designated cell phone number.   On chart review bu this RN CM for this post discharge outreach it was noted that a PCP HFU appointment is scheduled for 04/10/24 at 1145 am prior to discharge.   Follow Up Plan: Additional outreach attempts will be made to reach the patient to complete the Transitions of Care (Post Inpatient/ED visit) call.   Katheryn Pandy MSN, RN RN Case Sales executive Health  VBCI-Population Health Office Hours M-F [phone] Direct Dial: [phone] Main Phone [phone]  Fax: [phone] [email or web address]

## 2024-04-06 ENCOUNTER — Telehealth: Payer: Self-pay

## 2024-04-06 ENCOUNTER — Encounter (HOSPITAL_COMMUNITY): Payer: Self-pay | Admitting: Oncology

## 2024-04-06 NOTE — Transitions of Care (Post Inpatient/ED Visit) (Signed)
   04/06/2024  Name: PALMER FAHRNER MRN: [id number] DOB: [birth year]  Today's TOC FU Call Status: Today's TOC FU Call Status:: Unsuccessful Call (2nd Attempt) Unsuccessful Call (2nd Attempt) Date: 04/06/24  Attempted to reach the patient regarding the most recent Inpatient/ED visit.Upon chart review for this outreach it was noted that PCP HFU appointment was changed by patient to 04/17/24 from 04/10/24.   Follow Up Plan: Additional outreach attempts will be made to reach the patient to complete the Transitions of Care (Post Inpatient/ED visit) call.    Katheryn Pandy MSN, RN RN Case Sales executive Health  VBCI-Population Health Office Hours M-F [phone] Direct Dial: [phone] Main Phone [phone]  Fax: [phone] [email or web address]

## 2024-04-07 ENCOUNTER — Telehealth (HOSPITAL_COMMUNITY): Payer: Self-pay

## 2024-04-07 ENCOUNTER — Telehealth: Payer: Self-pay

## 2024-04-07 NOTE — Telephone Encounter (Signed)
 Called to confirm/remind patient of their appointment at the Advanced [facility] Failure Clinic on 04/10/2024 9:30.   Appointment:   [x] Confirmed  [] Left mess   [] No answer/No voice mail  [] VM Full/unable to leave message  [] Phone not in service  Patient reminded to bring all medications and/or complete list.  Confirmed patient has transportation. Gave directions, instructed to utilize valet parking.

## 2024-04-07 NOTE — Transitions of Care (Post Inpatient/ED Visit) (Signed)
   04/07/2024  Name: Eduardo [name] MRN: [id number] DOB: [birth year]  Today's TOC FU Call Status: Today's TOC FU Call Status:: Unsuccessful Call (3rd Attempt) Unsuccessful Call (3rd Attempt) Date: 04/07/24  Attempted to reach the patient regarding the most recent Inpatient/ED visit.  Follow Up Plan: No further outreach attempts will be made at this time. We have been unable to contact the patient.   Katheryn Pandy MSN, RN RN Case Sales executive Health  VBCI-Population Health Office Hours M-F [phone] Direct Dial: [phone] Main Phone [phone]  Fax: [phone] [email or web address]

## 2024-04-10 ENCOUNTER — Ambulatory Visit (HOSPITAL_COMMUNITY)
Admit: 2024-04-10 | Discharge: 2024-04-10 | Disposition: A | Discharge status: Home / Self Care | Attending: Internal Medicine | Admitting: Internal Medicine

## 2024-04-10 ENCOUNTER — Ambulatory Visit (HOSPITAL_COMMUNITY): Payer: Self-pay | Admitting: Internal Medicine

## 2024-04-10 ENCOUNTER — Encounter (HOSPITAL_COMMUNITY): Payer: Self-pay

## 2024-04-10 ENCOUNTER — Inpatient Hospital Stay: Admitting: Family Medicine

## 2024-04-10 VITALS — BP 100/68 | HR 85 | Ht 69.0 in | Wt 222.0 lb

## 2024-04-10 DIAGNOSIS — I255 Ischemic cardiomyopathy: Secondary | ICD-10-CM | POA: Insufficient documentation

## 2024-04-10 DIAGNOSIS — Z79899 Other long term (current) drug therapy: Secondary | ICD-10-CM | POA: Diagnosis not present

## 2024-04-10 DIAGNOSIS — I493 Ventricular premature depolarization: Secondary | ICD-10-CM | POA: Diagnosis not present

## 2024-04-10 DIAGNOSIS — R5383 Other fatigue: Secondary | ICD-10-CM | POA: Diagnosis not present

## 2024-04-10 DIAGNOSIS — Z7902 Long term (current) use of antithrombotics/antiplatelets: Secondary | ICD-10-CM | POA: Diagnosis not present

## 2024-04-10 DIAGNOSIS — N1832 Chronic kidney disease, stage 3b: Secondary | ICD-10-CM | POA: Insufficient documentation

## 2024-04-10 DIAGNOSIS — I2582 Chronic total occlusion of coronary artery: Secondary | ICD-10-CM | POA: Insufficient documentation

## 2024-04-10 DIAGNOSIS — Z87891 Personal history of nicotine dependence: Secondary | ICD-10-CM | POA: Diagnosis not present

## 2024-04-10 DIAGNOSIS — I25118 Atherosclerotic heart disease of native coronary artery with other forms of angina pectoris: Secondary | ICD-10-CM

## 2024-04-10 DIAGNOSIS — Z66 Do not resuscitate: Secondary | ICD-10-CM | POA: Diagnosis not present

## 2024-04-10 DIAGNOSIS — I130 Hypertensive heart and chronic kidney disease with heart failure and stage 1 through stage 4 chronic kidney disease, or unspecified chronic kidney disease: Secondary | ICD-10-CM | POA: Diagnosis not present

## 2024-04-10 DIAGNOSIS — E1122 Type 2 diabetes mellitus with diabetic chronic kidney disease: Secondary | ICD-10-CM | POA: Diagnosis not present

## 2024-04-10 DIAGNOSIS — E785 Hyperlipidemia, unspecified: Secondary | ICD-10-CM | POA: Insufficient documentation

## 2024-04-10 DIAGNOSIS — I472 Ventricular tachycardia, unspecified: Secondary | ICD-10-CM | POA: Insufficient documentation

## 2024-04-10 DIAGNOSIS — Z4502 Encounter for adjustment and management of automatic implantable cardiac defibrillator: Secondary | ICD-10-CM | POA: Insufficient documentation

## 2024-04-10 DIAGNOSIS — I5082 Biventricular heart failure: Secondary | ICD-10-CM | POA: Diagnosis not present

## 2024-04-10 DIAGNOSIS — I5022 Chronic systolic (congestive) heart failure: Secondary | ICD-10-CM | POA: Diagnosis not present

## 2024-04-10 DIAGNOSIS — Z7985 Long-term (current) use of injectable non-insulin antidiabetic drugs: Secondary | ICD-10-CM | POA: Diagnosis not present

## 2024-04-10 DIAGNOSIS — C221 Intrahepatic bile duct carcinoma: Secondary | ICD-10-CM | POA: Diagnosis not present

## 2024-04-10 DIAGNOSIS — Z794 Long term (current) use of insulin: Secondary | ICD-10-CM | POA: Diagnosis not present

## 2024-04-10 DIAGNOSIS — I2510 Atherosclerotic heart disease of native coronary artery without angina pectoris: Secondary | ICD-10-CM | POA: Insufficient documentation

## 2024-04-10 DIAGNOSIS — I4729 Other ventricular tachycardia: Secondary | ICD-10-CM

## 2024-04-10 DIAGNOSIS — Z89421 Acquired absence of other right toe(s): Secondary | ICD-10-CM | POA: Diagnosis not present

## 2024-04-10 DIAGNOSIS — Z9049 Acquired absence of other specified parts of digestive tract: Secondary | ICD-10-CM | POA: Diagnosis not present

## 2024-04-10 LAB — BASIC METABOLIC PANEL WITH GFR
Anion gap: 9 (ref 5–15)
BUN: 46 mg/dL — ABNORMAL HIGH (ref 8–23)
CO2: 26 mmol/L (ref 22–32)
Calcium: 8.8 mg/dL — ABNORMAL LOW (ref 8.9–10.3)
Chloride: 103 mmol/L (ref 98–111)
Creatinine, Ser: 1.74 mg/dL — ABNORMAL HIGH (ref 0.61–1.24)
GFR, Estimated: 42 mL/min — ABNORMAL LOW (ref >60)
Glucose, Bld: 207 mg/dL — ABNORMAL HIGH (ref 70–99)
Potassium: 3.9 mmol/L (ref 3.5–5.1)
Sodium: 138 mmol/L (ref 135–145)

## 2024-04-10 LAB — BRAIN NATRIURETIC PEPTIDE: B Natriuretic Peptide: 3068.1 pg/mL — ABNORMAL HIGH (ref 0.0–100.0)

## 2024-04-10 LAB — CBC
HCT: 38.1 % — ABNORMAL LOW (ref 39.0–52.0)
Hemoglobin: 12.1 g/dL — ABNORMAL LOW (ref 13.0–17.0)
MCH: 27.6 pg (ref 26.0–34.0)
MCHC: 31.8 g/dL (ref 30.0–36.0)
MCV: 87 fL (ref 80.0–100.0)
Platelets: 209 10*3/uL (ref 150–400)
RBC: 4.38 MIL/uL (ref 4.22–5.81)
RDW: 19.9 % — ABNORMAL HIGH (ref 11.5–15.5)
WBC: 7.1 10*3/uL (ref 4.0–10.5)
nRBC: 0 % (ref 0.0–0.2)

## 2024-04-10 MED ORDER — TORSEMIDE 40 MG PO TABS: 40.0000 mg | ORAL_TABLET | Freq: Two times a day (BID) | ORAL | 3 refills | Status: DC

## 2024-04-10 MED ORDER — METOLAZONE 2.5 MG PO TABS
ORAL_TABLET | ORAL | 0 refills | Status: DC
Start: 2024-04-10 — End: 2024-04-17

## 2024-04-10 MED ORDER — POTASSIUM CHLORIDE CRYS ER 20 MEQ PO TBCR: EXTENDED_RELEASE_TABLET | ORAL | 0 refills | Status: DC

## 2024-04-10 NOTE — Addendum Note (Signed)
 Encounter addended by: Marquis Sitter, [place] on: 04/10/2024 11:25 AM  Actions taken: Clinical Note Signed

## 2024-04-10 NOTE — Patient Instructions (Signed)
 Increase torsemide  to 40 mg twice daily - updated Rx sent. Take metolazone 2.5 mg today and tomorrow.  Take potassium 40 meq today and tomorrow. Take these medications only as directed by Heart Failure Clinic. Labs today - will call you if abnormal. Return to Heart Failure APP Clinic next week - see below. Please call us  at [phone] if any questions or concerns prior to your next visit.

## 2024-04-10 NOTE — Progress Notes (Signed)
 ReDS Vest / Clip - 04/10/24 0935       ReDS Vest / Clip   Station Marker D    Ruler Value 32    ReDS Value Range High volume overload    ReDS Actual Value 42

## 2024-04-16 NOTE — Progress Notes (Unsigned)
 No chief complaint on file.  Patient presents for hospital follow-up. He was hospitalized 5/16-27/25 with heart failure.  He was admitted on 5/16 for IV diuresis and CHF optimization in the setting of discussions of surgical resection of liver mass.  He required escalating amounts of diuretics, with poor response and hypotension. He was treated with pressors, and discharged on midodrine .  Echo showed LVEF 20-25%, RV moderately reduced.  He ultimately had good diuresis. He had cardiology f/u 6/2, and was noted to have regained 30#.  Diuretics were adjusted. Weight at hospital discharge was 191 lb 11.2 oz (87 kg). Wt Readings from Last 3 Encounters:  04/10/24 222 lb (100.7 kg)  04/01/24 191 lb 11.2 oz (87 kg)  03/24/24 228 lb 6.4 oz (103.6 kg)    He was to continue midodrine  to 15 mg tid, wean as able. He continues on plavix , and lipid therapies with crestor , gemfibrozil  and ezetimide for CAD, along with amiodarone .  He has ICD. He has CKD with last Cr above baseline at 1.74 on 6/2. He is DNR, previously declined VAD (202)  Recently diagnosed with an intrahepatic cholangiocarcinoma. While his tumor was possibly resectable, he was felt to not be a surgical candidate. Can consider other treatment options including hepatic embolization and systemic therapy (chemo) pending molecular testing of liver tissue biopsy and outpatient PET scan. He has follow up with Dr Scherrie Curt on 6/12.  DM --managed by Dr. [name]. Last A1c was 6.7% on 03/24/24. He remains on Mounjaro, current dose is 12.5 mg. He remains on insulin --N 5 U qAM, and sliding scale humalog (some days doesn't need).  ***UPDATE  Hypoglycemia? Sugars? *** `   PMH, PSH, SH reviewed   ROS:    PHYSICAL EXAM:  There were no vitals taken for this visit.  Wt Readings from Last 3 Encounters:  04/10/24 222 lb (100.7 kg)  04/01/24 191 lb 11.2 oz (87 kg)  03/24/24 228 lb 6.4 oz (103.6 kg)       ASSESSMENT/PLAN:

## 2024-04-17 ENCOUNTER — Encounter: Payer: Self-pay | Admitting: Family Medicine

## 2024-04-17 ENCOUNTER — Telehealth (HOSPITAL_COMMUNITY): Payer: Self-pay

## 2024-04-17 ENCOUNTER — Ambulatory Visit (INDEPENDENT_AMBULATORY_CARE_PROVIDER_SITE_OTHER): Admitting: Family Medicine

## 2024-04-17 VITALS — BP 104/64 | HR 84 | Ht 69.0 in | Wt 208.2 lb

## 2024-04-17 DIAGNOSIS — R5383 Other fatigue: Secondary | ICD-10-CM

## 2024-04-17 DIAGNOSIS — E1169 Type 2 diabetes mellitus with other specified complication: Secondary | ICD-10-CM | POA: Diagnosis not present

## 2024-04-17 DIAGNOSIS — C221 Intrahepatic bile duct carcinoma: Secondary | ICD-10-CM | POA: Diagnosis not present

## 2024-04-17 DIAGNOSIS — R142 Eructation: Secondary | ICD-10-CM | POA: Diagnosis not present

## 2024-04-17 DIAGNOSIS — I5043 Acute on chronic combined systolic (congestive) and diastolic (congestive) heart failure: Secondary | ICD-10-CM | POA: Diagnosis not present

## 2024-04-17 DIAGNOSIS — Z66 Do not resuscitate: Secondary | ICD-10-CM

## 2024-04-17 DIAGNOSIS — Z794 Long term (current) use of insulin: Secondary | ICD-10-CM | POA: Diagnosis not present

## 2024-04-17 DIAGNOSIS — Z89511 Acquired absence of right leg below knee: Secondary | ICD-10-CM | POA: Diagnosis not present

## 2024-04-17 DIAGNOSIS — Z95810 Presence of automatic (implantable) cardiac defibrillator: Secondary | ICD-10-CM

## 2024-04-17 NOTE — Patient Instructions (Addendum)
 Consider counseling. Don't feel bad about needing to take naps. Listen to your body. Hopefully you will feel better after talking to Dr. Scherrie Curt and hearing your treatment options. Please be sure to have your pink MOST form and your orange DNR form visible in your home. Be sure to let Dr. [name] know that you stopped the Mounjaro.  I recommend trying famotidine  (Pepcid ) prior to eating (ideally 30 mins prior). You can take either a 10 mg dose twice daily, or 20 mg prior to your largest meal. Max dose is 20 mg daily given your current kidney function.  Continue to limit coffee and carbonated beverages. Limit acidic fruits (citrus, pineapple).  Start the miralax  as planned.

## 2024-04-17 NOTE — Telephone Encounter (Signed)
 Called to confirm/remind patient of their appointment at the [facility] on 07/19/2024 9:30.   Appointment:   [x] Confirmed  [] Left mess   [] No answer/No voice mail  [] VM Full/unable to leave message  [] Phone not in service  Patient reminded to bring all medications and/or complete list.  Confirmed patient has transportation. Gave directions, instructed to utilize valet parking.

## 2024-04-17 NOTE — Progress Notes (Incomplete)
 ADVANCED HF CLINIC NOTE  Referring Physician: Roosvelt Colla, MD Primary Care: Roosvelt Colla, MD Primary Cardiologist: Cody Das, MD Heart Failure Cardiologist: Jules Oar, MD  Chief Complaint: Chronic systolic CHF  HPI: Eduardo [name] is 68 y.o. 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   Presented to [facility] 12/3 with acute hypoxic respiratory failure due to pulmonary edema. Intubated in ER. He was found to have new LBBB with pulmonary edema and code STEMI called. He was taken to cath lab found to EF 15-20%, LVEDP 29 and severe native three-vessel coronary artery  disease (LAD 100%p LCX all marginals occluded< RCA diffuse distal 99%) - with no obvious culprit lesion and no targets for revascularization. Hstrop peaked at 27k. Admitted to CCU for management of cardiogenic shock. Started on [place].  Echo EF 20-25% RV normal. He stabilized relatively quickly and was discharged home.    Seen in VAD Clinic 1/22 and decided against VAD.   Readmitted 12/28/20 with ADHF. EF 15%. Went for cath with Dr. Filiberto Hug and Dr. Berry Bristol. Cath with 100% LAD. OM-1 100% Distal RCA 99% Attempted CTO of LAD with Impella support but unsuccessful.  Of note, he underwent small bowel resection and anastomosis for small bowel obstruction where liver mass was noted. He underwent biopsy which showed poorly differentiated carcinoma, most consistent with cholangiocarcinoma.     Admitted 5/25 with acute on chronic biventricular HF. Echo showed EF 20-25% and new severely reduced RV, mild MR. RHC showed severely elevated left sided filling pressures with PCWP of 29 and reduced TD CI of 1.8. Required pressor and inotrope support to diurese. Required midodrine  to fully wean off inotrope. No GDMT with soft BP. With recent diagnosis of cholangiocarcinoma, he was deemed not a candidate for VAD and he  previously declined.   Here today for close HF follow-up.   Past Medical History:  Diagnosis Date   CHF (congestive heart failure) (HCC)    Chronic kidney disease    stage 3   Colon polyp    Coronary artery disease    Diabetes mellitus 1987   under care of Dr. [name].  On insulin  since 96 (off and on)   Diabetic retinopathy    Dupuytren contracture    R hand, s/p injection (Dr. Jonna Netter)   Essential hypertension, benign    Essential hypertension, benign 02/06/2019   Frequency of urination and polyuria    Hypertension    Myocardial infarction [facility])    denies   Neuromuscular disorder (HCC)    Diabetic neuropathy   Osteomyelitis (HCC)    right foot   Other testicular hypofunction    Peripheral arterial disease (HCC) 10/28/2012   Peritoneal abscess (HCC) 6/08   and buttock.   Pneumonia    Polydipsia    Proteinuria    Pure hyperglyceridemia    Subacute osteomyelitis, right ankle and foot (HCC)    Wears glasses     Current Outpatient Medications  Medication Sig Dispense Refill   amiodarone  (PACERONE ) 200 MG tablet Take 1 tablet (200 mg total) by mouth 2 (two) times daily for 7 days, THEN 1 tablet (200 mg total) daily thereafter 35 tablet 6   Bioflavonoid Products (ESTER C PO) Take 1,000 mg by mouth daily.     Cholecalciferol  (VITAMIN D ) 2000 units CAPS Take 2,000 Units by mouth daily.     clopidogrel  (  PLAVIX ) 75 MG tablet Take 1 tablet (75 mg total) by mouth daily. 90 tablet 2   Continuous Glucose Sensor (FREESTYLE LIBRE 2 PLUS SENSOR) MISC 1 Application.     ezetimibe  (ZETIA ) 10 MG tablet TAKE 1 TABLET BY MOUTH DAILY 90 tablet 3   gemfibrozil  (LOPID ) 600 MG tablet Take 600 mg by mouth 2 (two) times daily before a meal.     HUMALOG KWIKPEN 100 UNIT/ML KiwkPen Inject 3-10 Units into the skin 3 (three) times daily. Inject 01-1009 units into the skin three times a day, per sliding scale- based on BGL >100  1   HUMULIN  N KWIKPEN 100 UNIT/ML KwikPen Inject 6 Units into the skin daily.      Krill Oil 300 MG CAPS Take 500 mg by mouth daily.     lidocaine  (XYLOCAINE ) 5 % ointment Apply 1 Application topically as needed. For pain as needed- can use up to 4x/day (Patient not taking: Reported on 04/17/2024) 50 g 5   melatonin 10 MG TABS Take 10 mg by mouth at bedtime.  0   midodrine  (PROAMATINE ) 5 MG tablet Take 3 tablets (15 mg total) by mouth 3 (three) times daily with meals. 270 tablet 6   Multiple Vitamin (MULTIVITAMIN WITH MINERALS) TABS tablet Take 1 tablet by mouth daily.     nitroGLYCERIN  (NITROSTAT ) 0.4 MG SL tablet Place 1 tablet (0.4 mg total) under the tongue every 5 (five) minutes as needed for chest pain. (Patient not taking: Reported on 04/17/2024) 15 tablet 3   Probiotic Product (PROBIOTIC-10 PO) Take 1 capsule by mouth daily.     rosuvastatin  (CRESTOR ) 20 MG tablet Take 1 tablet (20 mg total) by mouth daily. 90 tablet 3   sodium chloride  (OCEAN) 0.65 % SOLN nasal spray Place 1 spray into both nostrils as needed for congestion. (Patient not taking: Reported on 04/17/2024)     tamsulosin  (FLOMAX ) 0.4 MG CAPS capsule Take 1 capsule (0.4 mg total) by mouth daily after supper. 30 capsule 6   tirzepatide (MOUNJARO) 12.5 MG/0.5ML Pen Inject 12.5 mg into the skin once a week. (Patient not taking: Reported on 04/17/2024)     torsemide  40 MG TABS Take 40 mg by mouth 2 (two) times daily. 180 tablet 3   No current facility-administered medications for this visit.    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 Retired. 1 cat.  Girlfriend and her daughter lives with him. Enjoys fishing.   Updated 06/2021   Social Drivers of Health   Financial Resource Strain: Low Risk  (07/28/2023)   Overall Financial Resource Strain (CARDIA)    Difficulty of Paying Living Expenses: Not hard at all  Food Insecurity: No Food Insecurity (03/24/2024)   Hunger Vital Sign    Worried About Running Out of Food in the Last Year: Never true    Ran Out of Food in the Last Year: Never true  Transportation Needs: No Transportation Needs (03/24/2024)   PRAPARE - Administrator, Civil Service (Medical): No    Lack of Transportation (Non-Medical): No  Physical Activity: Sufficiently Active (07/28/2023)   Exercise Vital Sign    Days of Exercise per Week: 7 days    Minutes of Exercise per Session: 90 min  Stress: No Stress Concern Present (07/28/2023)   [employer] of Occupational Health - Occupational Stress Questionnaire    Feeling of Stress : Not at all  Social Connections: Socially Isolated (03/24/2024)   Social Connection and Isolation Panel [NHANES]    Frequency of Communication with Friends and Family: Never    Frequency of Social Gatherings with Friends and Family: Never    Attends Religious Services: Never     Database administrator or Organizations: No    Attends Banker Meetings: Never    Marital Status: Living with partner  Intimate Partner Violence: Not At Risk (03/24/2024)   Humiliation, Afraid, Rape, and Kick questionnaire    Fear of Current or Ex-Partner: No    Emotionally Abused: No    Physically Abused: No    Sexually Abused: No      Family History  Problem Relation Age of Onset   Diabetes Mother    Hearing loss Mother    Hypertension Mother    Hyperlipidemia Mother    Heart disease Mother    Varicose Veins Mother    Varicose Veins Father    Dementia Father    Hyperlipidemia Brother    Diabetes Maternal Grandmother     There were no vitals filed for this visit.   PHYSICAL EXAM: General:  well appearing.  No respiratory difficulty Neck: supple. JVD ~14 cm.  Cor: PMI nondisplaced. Regular rate & rhythm. No rubs, gallops or murmurs. Lungs: clear. LLL diminished.  Extremities: no cyanosis, clubbing, rash, edema. RLE prosthetic. +2-3 LLE edema Neuro: alert & oriented x 3. Moves all 4 extremities w/o difficulty. Affect pleasant.   Wt Readings from Last 3 Encounters:  04/17/24 94.4 kg (208 lb 3.2 oz)  04/10/24 100.7 kg (222 lb)  04/01/24 87 kg (191 lb 11.2 oz)    ECG: a sensed v paced 80 bpm with PVCs. QTc 509 (Personally reviewed)    St Jude ICD device interrogation: CorVue WNL. NSVT 03/16/24 10 seconds.   ReDs reading: 42 %, abnormal   ASSESSMENT & PLAN: 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 Longstanding history of severely reduced EF <20-25% since 2021 with diastolic dysfunction and relatively ok RV. - Has CRT-D - Echo 5/25 EF 20-25% and new severely reduced RV, mild MR - RHC 5/21 on 8 mcg/min of  NE showed severely elevated left sided filling pressures with PCWP of 29 and reduced TD CI of 1.8. - NYHA IIIa, symptomatically feels much better that before last admission aside from ongoing fatigue.  - Diuresed > 30 pounds recently, now back up 31  lbs. Volume overloaded on exam but not by device interrogation. ReDS clip elevated.  - Increase Torsemide  60 mg daily> 40 mg BID - He will take metolazone  2.5 today and tomorrow + 40 mEq for those 2 days.  - Continue midodrine  to 15 mg tid. Plan to wean as able - GDMT limited by hypotension and renal function - Previously worked up for VAD in 2022, did not want to pursue. At this point would not be a candidate for advanced therapies, goal is to allow him some quality time out of hospital.  He is now DNR.    2. CAD - severe native three-vessel coronary artery disease (LAD 100%, p LCX all marginals occluded, RCA diffuse distal 99%)  - No chest pain.  - continue crestor , plavix , gemfibrozil , and ezetimibe    3. NSVT  - Continue amiodarone  200 mg bid.  - he has ICD    4. CKD 3b - b/l Cr 1.3-1.5  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 Seen by Dr. Scherrie Curt 05/25. Not a surgical candidate currently. Can consider other treatment options including hepatic embolization and systemic therapy pending molecular testing of liver tissue biopsy and outpatient PET  scan   Follow up  [facility], [facility] N AGACNP-BC  [facility] [address] Heart and Vascular [place] [address] [phone] (office) [phone] (fax)

## 2024-04-18 ENCOUNTER — Encounter (HOSPITAL_COMMUNITY): Payer: Self-pay

## 2024-04-18 ENCOUNTER — Other Ambulatory Visit (HOSPITAL_COMMUNITY): Payer: Self-pay

## 2024-04-18 ENCOUNTER — Ambulatory Visit (HOSPITAL_COMMUNITY)
Admission: RE | Admit: 2024-04-18 | Discharge: 2024-04-18 | Disposition: A | Discharge status: Home / Self Care | Source: Ambulatory Visit | Attending: Physician Assistant | Admitting: Physician Assistant

## 2024-04-18 ENCOUNTER — Ambulatory Visit (HOSPITAL_COMMUNITY): Payer: Self-pay | Admitting: Physician Assistant

## 2024-04-18 VITALS — BP 92/60 | HR 78 | Ht 69.0 in | Wt 208.2 lb

## 2024-04-18 DIAGNOSIS — N1831 Chronic kidney disease, stage 3a: Secondary | ICD-10-CM | POA: Insufficient documentation

## 2024-04-18 DIAGNOSIS — Z794 Long term (current) use of insulin: Secondary | ICD-10-CM | POA: Insufficient documentation

## 2024-04-18 DIAGNOSIS — I4729 Other ventricular tachycardia: Secondary | ICD-10-CM | POA: Diagnosis not present

## 2024-04-18 DIAGNOSIS — I130 Hypertensive heart and chronic kidney disease with heart failure and stage 1 through stage 4 chronic kidney disease, or unspecified chronic kidney disease: Secondary | ICD-10-CM | POA: Diagnosis not present

## 2024-04-18 DIAGNOSIS — Z955 Presence of coronary angioplasty implant and graft: Secondary | ICD-10-CM | POA: Insufficient documentation

## 2024-04-18 DIAGNOSIS — Z7902 Long term (current) use of antithrombotics/antiplatelets: Secondary | ICD-10-CM | POA: Insufficient documentation

## 2024-04-18 DIAGNOSIS — Z87891 Personal history of nicotine dependence: Secondary | ICD-10-CM | POA: Diagnosis not present

## 2024-04-18 DIAGNOSIS — N1832 Chronic kidney disease, stage 3b: Secondary | ICD-10-CM

## 2024-04-18 DIAGNOSIS — Z89511 Acquired absence of right leg below knee: Secondary | ICD-10-CM | POA: Insufficient documentation

## 2024-04-18 DIAGNOSIS — E1122 Type 2 diabetes mellitus with diabetic chronic kidney disease: Secondary | ICD-10-CM | POA: Diagnosis not present

## 2024-04-18 DIAGNOSIS — I5022 Chronic systolic (congestive) heart failure: Secondary | ICD-10-CM | POA: Diagnosis not present

## 2024-04-18 DIAGNOSIS — C221 Intrahepatic bile duct carcinoma: Secondary | ICD-10-CM | POA: Insufficient documentation

## 2024-04-18 DIAGNOSIS — Z79899 Other long term (current) drug therapy: Secondary | ICD-10-CM | POA: Diagnosis not present

## 2024-04-18 DIAGNOSIS — I2510 Atherosclerotic heart disease of native coronary artery without angina pectoris: Secondary | ICD-10-CM | POA: Diagnosis not present

## 2024-04-18 DIAGNOSIS — E785 Hyperlipidemia, unspecified: Secondary | ICD-10-CM | POA: Insufficient documentation

## 2024-04-18 DIAGNOSIS — Z95810 Presence of automatic (implantable) cardiac defibrillator: Secondary | ICD-10-CM | POA: Insufficient documentation

## 2024-04-18 DIAGNOSIS — I255 Ischemic cardiomyopathy: Secondary | ICD-10-CM | POA: Diagnosis not present

## 2024-04-18 DIAGNOSIS — I5082 Biventricular heart failure: Secondary | ICD-10-CM

## 2024-04-18 DIAGNOSIS — I5032 Chronic diastolic (congestive) heart failure: Secondary | ICD-10-CM | POA: Insufficient documentation

## 2024-04-18 LAB — BASIC METABOLIC PANEL WITH GFR
Anion gap: 14 (ref 5–15)
BUN: 41 mg/dL — ABNORMAL HIGH (ref 8–23)
CO2: 28 mmol/L (ref 22–32)
Calcium: 9.5 mg/dL (ref 8.9–10.3)
Chloride: 101 mmol/L (ref 98–111)
Creatinine, Ser: 1.71 mg/dL — ABNORMAL HIGH (ref 0.61–1.24)
GFR, Estimated: 43 mL/min — ABNORMAL LOW (ref >60)
Glucose, Bld: 97 mg/dL (ref 70–99)
Potassium: 3.3 mmol/L — ABNORMAL LOW (ref 3.5–5.1)
Sodium: 143 mmol/L (ref 135–145)

## 2024-04-18 LAB — BRAIN NATRIURETIC PEPTIDE: B Natriuretic Peptide: 4214.9 pg/mL — ABNORMAL HIGH (ref 0.0–100.0)

## 2024-04-18 MED ORDER — POTASSIUM CHLORIDE CRYS ER 20 MEQ PO TBCR: 20.0000 meq | EXTENDED_RELEASE_TABLET | Freq: Every day | ORAL | 3 refills | Status: DC

## 2024-04-18 MED ORDER — POTASSIUM CHLORIDE CRYS ER 20 MEQ PO TBCR: 40.0000 meq | EXTENDED_RELEASE_TABLET | Freq: Every day | ORAL | Status: DC

## 2024-04-18 MED ORDER — TORSEMIDE 20 MG PO TABS: 60.0000 mg | ORAL_TABLET | Freq: Two times a day (BID) | ORAL | 3 refills | Status: DC

## 2024-04-18 NOTE — Patient Instructions (Addendum)
 INCREASE Torsemide  to 60 mg ( 3 Tabs) Twice daily  START Potassium 20 mEq ( 1 Tab) daily.  Labs reviewed by RN, will forward to MD for review  Repeat blood work in 1 week.  Your physician recommends that you schedule a follow-up appointment in: 1 month.  If you have any questions or concerns before your next appointment please send us  a message through [place] or call our office at [phone].    TO LEAVE A MESSAGE FOR THE NURSE SELECT OPTION 2, PLEASE LEAVE A MESSAGE INCLUDING: YOUR NAME DATE OF BIRTH CALL BACK NUMBER REASON FOR CALL**this is important as we prioritize the call backs  YOU WILL RECEIVE A CALL BACK THE SAME DAY AS LONG AS YOU CALL BEFORE 4:00 PM  At the [facility], you and your health needs are our priority. As part of our continuing mission to provide you with exceptional heart care, we have created designated Provider Care Teams. These Care Teams include your primary Cardiologist (physician) and Advanced Practice Providers (APPs- Physician Assistants and Nurse Practitioners) who all work together to provide you with the care you need, when you need it.   You may see any of the following providers on your designated Care Team at your next follow up: Dr Jules Oar Dr Peder Bourdon Dr. Alwin Baars Dr. Arta Lark Amy Marijane Shoulders, NP Ruddy Corral, [place] [facility] [place], [place] Dennise Fitz, NP [place] Lee, NP Shawnee Dellen, NP Luster Salters, PharmD Bevely Brush, PharmD   Please be sure to bring in all your medications bottles to every appointment.    Thank you for choosing [facility]

## 2024-04-18 NOTE — Progress Notes (Signed)
 ReDS Vest / Clip - 04/18/24 0943       ReDS Vest / Clip   Station Marker D    Ruler Value 32    ReDS Value Range High volume overload    ReDS Actual Value 44

## 2024-04-18 NOTE — Telephone Encounter (Addendum)
 Pt aware, agreeable, and verbalized understanding  Med list updated   ----- Message from [facility] N sent at 04/18/2024  1:35 PM EDT ----- Potassium is low. We started 20 meq k today, we should increase it to 40 meq daily. Get bmet next week as discussed today.

## 2024-04-20 ENCOUNTER — Inpatient Hospital Stay: Attending: Nurse Practitioner | Admitting: Oncology

## 2024-04-20 ENCOUNTER — Ambulatory Visit (INDEPENDENT_AMBULATORY_CARE_PROVIDER_SITE_OTHER): Payer: Medicare Other

## 2024-04-20 VITALS — BP 98/66 | HR 91 | Temp 98.1°F | Resp 18 | Ht 69.0 in | Wt 209.9 lb

## 2024-04-20 DIAGNOSIS — C221 Intrahepatic bile duct carcinoma: Secondary | ICD-10-CM | POA: Insufficient documentation

## 2024-04-20 DIAGNOSIS — N189 Chronic kidney disease, unspecified: Secondary | ICD-10-CM | POA: Insufficient documentation

## 2024-04-20 DIAGNOSIS — I130 Hypertensive heart and chronic kidney disease with heart failure and stage 1 through stage 4 chronic kidney disease, or unspecified chronic kidney disease: Secondary | ICD-10-CM | POA: Insufficient documentation

## 2024-04-20 DIAGNOSIS — E1140 Type 2 diabetes mellitus with diabetic neuropathy, unspecified: Secondary | ICD-10-CM | POA: Insufficient documentation

## 2024-04-20 DIAGNOSIS — R160 Hepatomegaly, not elsewhere classified: Secondary | ICD-10-CM

## 2024-04-20 DIAGNOSIS — I447 Left bundle-branch block, unspecified: Secondary | ICD-10-CM | POA: Diagnosis not present

## 2024-04-20 LAB — CUP PACEART REMOTE DEVICE CHECK
Battery Remaining Longevity: 67 mo
Battery Remaining Percentage: 75 %
Battery Voltage: 2.98 V
Brady Statistic AP VP Percent: 3 %
Brady Statistic AP VS Percent: 1 %
Brady Statistic AS VP Percent: 91 %
Brady Statistic AS VS Percent: 3.9 %
Brady Statistic RA Percent Paced: 1.5 %
Date Time Interrogation Session: 20250612021831
HighPow Impedance: 41 Ohm
Implantable Lead Connection Status: 753985
Implantable Lead Connection Status: 753985
Implantable Lead Connection Status: 753985
Implantable Lead Implant Date: 20231213
Implantable Lead Implant Date: 20231213
Implantable Lead Implant Date: 20231213
Implantable Lead Location: 753858
Implantable Lead Location: 753859
Implantable Lead Location: 753860
Implantable Pulse Generator Implant Date: 20231213
Lead Channel Impedance Value: 330 Ohm
Lead Channel Impedance Value: 330 Ohm
Lead Channel Impedance Value: 610 Ohm
Lead Channel Pacing Threshold Amplitude: 0.625 V
Lead Channel Pacing Threshold Amplitude: 0.875 V
Lead Channel Pacing Threshold Amplitude: 1.375 V
Lead Channel Pacing Threshold Pulse Width: 0.5 ms
Lead Channel Pacing Threshold Pulse Width: 0.5 ms
Lead Channel Pacing Threshold Pulse Width: 0.5 ms
Lead Channel Sensing Intrinsic Amplitude: 11.9 mV
Lead Channel Sensing Intrinsic Amplitude: 5 mV
Lead Channel Setting Pacing Amplitude: 1.625
Lead Channel Setting Pacing Amplitude: 1.875
Lead Channel Setting Pacing Amplitude: 1.875
Lead Channel Setting Pacing Pulse Width: 0.5 ms
Lead Channel Setting Pacing Pulse Width: 0.5 ms
Lead Channel Setting Sensing Sensitivity: 0.5 mV
Pulse Gen Serial Number: 211000872

## 2024-04-20 NOTE — Progress Notes (Signed)
 [facility] OFFICE PROGRESS NOTE   Diagnosis: Cholangiocarcinoma  INTERVAL HISTORY:   Eduardo [name] was admitted 03/24/2024 for management of decompensated heart failure.  He was discharged to home 04/04/2024.  He regained weight following discharge from the hospital and reports this improved with an increased dose of torsemide .  No nausea or abdominal pain.  No other complaint.  He was evaluated by Dr. Leighton Punches in the hospital and is not a surgical candidate.  Objective:  Vital signs in last 24 hours:  Blood pressure 98/66, pulse 91, temperature 98.1 F (36.7 C), temperature source Temporal, resp. rate 18, height 5' 9 (1.753 m), weight 209 lb 14.4 oz (95.2 kg), SpO2 100%.    Lymphatics: No cervical, supraclavicular, axillary, or inguinal nodes Resp: Lungs with with decreased breath sounds at the left greater than right lower posterior chest, no respiratory distress Cardio: Regular rate and rhythm GI: No hepatosplenomegaly, nontender Vascular: Trace left leg edema   Lab Results  Component Value Date   WBC 7.1 04/10/2024   HGB 12.1 (L) 04/10/2024   HCT 38.1 (L) 04/10/2024   MCV 87.0 04/10/2024   PLT 209 04/10/2024   NEUTROABS 5.6 03/06/2024    CMP  Lab Results  Component Value Date   NA 143 04/18/2024   K 3.3 (L) 04/18/2024   CL 101 04/18/2024   CO2 28 04/18/2024   GLUCOSE 97 04/18/2024   BUN 41 (H) 04/18/2024   CREATININE 1.71 (H) 04/18/2024   CALCIUM  9.5 04/18/2024   PROT 5.7 (L) 03/26/2024   ALBUMIN  2.8 (L) 03/26/2024   AST 24 03/26/2024   ALT 8 03/26/2024   ALKPHOS 64 03/26/2024   BILITOT 0.9 03/26/2024   GFRNONAA 43 (L) 04/18/2024   GFRAA 44 (L) 12/19/2020    No results found for: CEA1, CEA, [id number], CA125  Lab Results  Component Value Date   INR 1.2 03/16/2024   LABPROT 15.7 (H) 03/16/2024    Imaging:  CUP PACEART REMOTE DEVICE CHECK Result Date: 04/20/2024 ICD-Scheduled remote reviewed. Normal device function.  Presenting  rhythm: AS/BiVP Next remote 91 days. ML, CVRS   Medications: I have reviewed the patient's current medications.   Assessment/Plan:  Cholangiocarcinoma CT abdomen/pelvis 11/14/2023-closed-loop small bowel obstruction, 8.8 cm hypoattenuating lesion in the posterior right liver with suggestion of nodular peripheral enhancement, small bilateral pleural effusions MRI abdomen 01/27/2024-rim-enhancing centrally hypoattenuating mass in the posterior right liver segment 7 measuring 11 x 8.4 cm, increased in size compared to the 11/14/2023 CT consistent with a necrotic metastasis or primary liver mass, severe anasarca, small volume ascites, bilateral pleural effusions, mild splenomegaly CT abdomen/pelvis 02/14/2024-no change in complex right liver lesion, small bilateral pleural effusions left greater than right, soft tissue edema of the anterior abdominal wall Biopsy liver lesion 03/06/2024-benign hepatic parenchyma with reactive changes CT chest 03/09/2024-a few scattered small lung nodules measuring up to 4 mm right lower and left upper lobes.  Mild cardiomegaly.  Stable right and moderate left pleural effusions.  Anasarca.  Similar large hypodense mass filling the posterior right hepatic lobe. 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  He appears to have disease localized to a right liver mass.  He has multiple comorbid conditions including advanced heart failure, CAD, diabetes, and chronic renal failure.  He is not a surgical candidate.  We discussed treatment options including hepatic directed therapy and systemic therapies.  He does not appear to be a candidate for cisplatin based therapy due to renal insufficiency.  We can consider treatment with FOLFOX/immunotherapy or treatment directed at the FGFR2 lesion.  I think it would be difficult for him to tolerate FOLFOX.  I will present his case at the GI tumor conference next week.  He will return for an office visit in 3 weeks.  I will most likely recommend treatment with an FGFR inhibitor if he is not a candidate for hepatic directed therapy.  Coni Deep, MD  04/20/2024  5:05 PM

## 2024-04-21 ENCOUNTER — Other Ambulatory Visit: Payer: Self-pay | Admitting: Cardiology

## 2024-04-23 ENCOUNTER — Ambulatory Visit: Payer: Self-pay | Admitting: Internal Medicine

## 2024-04-24 ENCOUNTER — Encounter: Payer: Self-pay | Admitting: Cardiology

## 2024-04-24 ENCOUNTER — Ambulatory Visit: Attending: Cardiology | Admitting: Cardiology

## 2024-04-24 VITALS — BP 86/58 | HR 76 | Ht 69.0 in | Wt 201.0 lb

## 2024-04-24 DIAGNOSIS — I5020 Unspecified systolic (congestive) heart failure: Secondary | ICD-10-CM | POA: Diagnosis not present

## 2024-04-24 DIAGNOSIS — I5022 Chronic systolic (congestive) heart failure: Secondary | ICD-10-CM | POA: Diagnosis not present

## 2024-04-24 DIAGNOSIS — I25118 Atherosclerotic heart disease of native coronary artery with other forms of angina pectoris: Secondary | ICD-10-CM | POA: Diagnosis not present

## 2024-04-24 DIAGNOSIS — Z7189 Other specified counseling: Secondary | ICD-10-CM | POA: Diagnosis not present

## 2024-04-24 NOTE — Progress Notes (Signed)
 Cardiology Office Note:  .   Date:  04/24/2024  ID:  Eduardo [name], DOB 27-Sep-1956, MRN [id number] PCP: Eduardo Colla, MD  [facility] Providers Cardiologist:  Eduardo Ivy, MD PCP: Eduardo Colla, MD  Chief Complaint  Patient presents with   HFrEF      History of Present Illness: .    Eduardo [name] is a 68 y.o. male with hypertension, hyperlipidemia, type 2 DM, extensive CAD, PAD s/p Rt BKA, HFrEF, ischemic cardiomyopathy s/p BiV ICD placement  Since his last visit with me, patient was hospitalized.  He underwent IV diuresis with limited response, required to be on dobutamine .  Right heart catheterization showed cardiac index of 1.8 by thermodilution.  He was deemed not to be a surgical candidate for any surgery for his recently diagnosed cholangiocarcinoma.  Eduardo [name] is discussing treatment options including hepatic directed therapy and systemic therapy, including FOLFOX/immunotherapy or treatment directed at Eduardo [name] lesion.  Patient is here today with his significant other Eduardo [name].  He has generally been feeling lousy.  He has noticed that his weight has gone up, also notices left leg swelling.  GDMT for heart failure has been limited due to his low blood pressure as well as renal function.  He is scheduled to get labs ordered by heart failure clinic today.  Vitals:   04/24/24 1450  BP: (!) 86/58  Pulse: 76  SpO2: 97%       ROS:  Review of Systems  Constitutional: Positive for malaise/fatigue.  Cardiovascular:  Positive for leg swelling. Negative for chest pain, dyspnea on exertion, palpitations and syncope.     Studies Reviewed: Eduardo [name]        Independently interpreted 04/2024: Hb 12.1 Cr 1.71, eGFR 43 BNP 4214  02/2024: Chol 83, TG 82, HDL 37, LDL 29 HbA1C 6.6% Hb 11.9 Cr 1.58, eGFR 47  RHC 03/2024: HEMODYNAMICS: RA:       12 mmHg (mean) RV:        60/8, 12 mmHg PA:       60/29 mmHg (45 mean) PCWP: 29 with v waves to 37 mmHg (mean)                                       Estimated Fick CO/CI   5.2L/min, 2.42L/min/m2         Thermodilution CO/CI   4.04L/min, 1.88L/min/m2                                      TPG  16  mmHg                                               PVR  4 Wood Units  PAPi  2.6      IMPRESSION: Right heart catheterization for systolic heart failure while on of NE Mildly elevated right and severely elevated left sided filling pressures 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 No LV thrombus by Definity . Left ventricular ejection fraction, by  estimation, is 20 to 25%. The left ventricle has severely decreased  function. The left ventricle demonstrates regional wall motion  abnormalities (see scoring diagram/findings for  description). The left ventricular internal cavity size was severely  dilated. There is moderate asymmetric left ventricular hypertrophy of the  septal segment. Left ventricular diastolic function could not be  evaluated.   2. Right ventricular systolic function is severely reduced. The right  ventricular size is mildly enlarged. Tricuspid regurgitation signal is  inadequate for assessing PA pressure.   3. Left atrial size was mildly dilated.   4. Large pleural effusion in the left lateral region.   5. The mitral valve is normal in structure. Mild mitral valve  regurgitation. No evidence of mitral stenosis.   6. The aortic valve is tricuspid. There is moderate calcification of the  aortic valve. Aortic valve regurgitation is not visualized. No aortic  stenosis is present.   7. The inferior vena cava is dilated in size with <50% respiratory  variability, suggesting  right atrial pressure of 15 mmHg.   Comparison(s): Changes from prior study are noted. LVEF has worsened from  30-35% to 20-25%, RV function is severely reduced and there is a large  left pleural effusion.    Left Heart Catheterization 10/11/20:  LV: Severely dilated.  Global hypokinesis.  Hand contrast injection hence not fully adequately visualized.  LVEF 15 to 20%.  EDP markedly elevated at 29 mmHg.  No pressure gradient across the aortic valve. Left main: Normal. LAD: Severely diffusely diseased.  Gives origin to large D1 which gives collaterals to the LAD and 2 smaller diagonals, LAD is occluded after the origin of D1, anatomy compared to prior in 2017 reveals progression of diffuse disease.  There are ipsilateral and contralateral collaterals to the LAD. CX: Moderate sized vessel, giving origin to large OM1.  OM1 is occluded in the ostium as was noted previously and has ipsilateral collaterals.  OM1 is diffusely diseased and faintly filled. RCA: Moderate disease in the proximal and mid segment.  At the bifurcation of PDA and PL, there is a high-grade 90% stenosis.  The bifurcation is also involved with at least a 90% stenosis in the PDA and a 60 to 70% stenosis in the PL branch.  There is no target as distally as the PL branch which is large is occluded distally that was also noted in 2017.  There are faint collaterals noted from the left to the RCA.   Impression: Severe native vessel three-vessel coronary artery disease with no significant targets for revascularization, LAD may be amenable for revascularization but I am not sure that this would help.  Patient is extremely ill with high risk for mortality.  He also has underlying stage III-IV kidney disease and contrast nephropathy needs to be evaluated further in view of contrast load of 70 mL. I try to reach his friend on the contact list, unable to reach.   Physical Exam:   Physical Exam Vitals and nursing note reviewed.   Constitutional:      General: He is not in acute distress. Neck:     Vascular: No JVD.   Cardiovascular:     Rate and Rhythm: Normal rate and regular rhythm.     Heart sounds: Normal heart sounds. No murmur heard. Pulmonary:     Effort: Pulmonary effort is normal.     Breath sounds: Normal breath sounds. No wheezing or  rales.  Genitourinary:    Comments: Scrotal edema  Musculoskeletal:     Left lower leg: Edema (2+) present.     Comments: Rt BKA       VISIT DIAGNOSES:   ICD-10-CM   1. HFrEF (heart failure with reduced ejection fraction) (HCC)  I50.20 Amb Referral to Palliative Care    2. 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 Advanced care planning/counseling discussion  Z71.89          ASSESSMENT AND PLAN: .    Eduardo [name] is a 68 y.o. male with hyperlipidemia, type 2 DM, extensive CAD, PAD s/p Rt BKA, HFrEF, ischemic cardiomyopathy s/p BiV ICD placement  HFrEF: Ischemic cardiomyopathy, severe biventricular failure.  EF 20-25%, CI 1.8  (03/2024 ) Currently on torsemide  60 mg twice daily with potassium supplement 20 mEq daily. Unfortunately, GDMT for heart failure is limited due to low blood pressure and renal dysfunction.  He is already on midodrine  15 mg 3 times daily to help with blood pressure. Even before diagnosis of cholangiocarcinoma, patient was not keen on right treatment option as far back as 2022.  With the diagnosis of cholangiocarcinoma, he is not a candidate for advanced heart failure treatment options. I will await lab results from today.  If renal function is stable, I would add metolazone  2.5 mg every other day for further diuresis, as he seems to have reached ceiling with diuretic response on torsemide  60 mg twice daily, as evident by his leg edema and weight gain. Finally, I discussed with patient and Eduardo [name], regarding palliative care options.  This does not mean no further medical  therapy for heart failure, but gives the realistic options to discuss and decide on goals of care.  He is currently DNR.  Patient is agreeable to palliative care consult. He will keep his follow-up with heart failure clinic in July.  I will see him in August 2025.  CAD: Severe multivessel CAD with no good targets for either percutaneous or surgical revascularization due to small caliber and diffuse nature of the disease.  No anginal symptoms at this time.   Fortunately, no anginal symptoms at this time. Continue Plavix  monotherapy, rosuvastatin , Zetia .  I spent 43 minutes in the care of Eduardo [name] today including reviewing tests performed during recent hospitalization, current treatment options for heart failure, limitations of medical and advanced heart failure treatment options, discussing advanced care options, including palliative care, and documenting in the encounter.     F/u in 6 months  Signed, Eduardo Das, MD

## 2024-04-24 NOTE — Patient Instructions (Signed)
  Follow-Up: At [facility], you and your health needs are our priority.  As part of our continuing mission to provide you with exceptional heart care, our providers are all part of one team.  This team includes your primary Cardiologist (physician) and Advanced Practice Providers or APPs (Physician Assistants and Nurse Practitioners) who all work together to provide you with the care you need, when you need it.  REFERRAL TO PALLIATIVE CARE   Your next appointment:   06/2024   Provider:   Cody Das, MD

## 2024-04-25 DIAGNOSIS — E1165 Type 2 diabetes mellitus with hyperglycemia: Secondary | ICD-10-CM | POA: Diagnosis not present

## 2024-04-25 LAB — BASIC METABOLIC PANEL WITH GFR
BUN/Creatinine Ratio: 31 — ABNORMAL HIGH (ref 10–24)
BUN: 48 mg/dL — ABNORMAL HIGH (ref 8–27)
CO2: 22 mmol/L (ref 20–29)
Calcium: 9.5 mg/dL (ref 8.6–10.2)
Chloride: 101 mmol/L (ref 96–106)
Creatinine, Ser: 1.56 mg/dL — ABNORMAL HIGH (ref 0.76–1.27)
Glucose: 128 mg/dL — ABNORMAL HIGH (ref 70–99)
Potassium: 4.9 mmol/L (ref 3.5–5.2)
Sodium: 143 mmol/L (ref 134–144)
eGFR: 48 mL/min/{1.73_m2} — ABNORMAL LOW (ref >59)

## 2024-04-26 ENCOUNTER — Other Ambulatory Visit: Payer: Self-pay

## 2024-04-26 ENCOUNTER — Ambulatory Visit: Admitting: Oncology

## 2024-04-28 ENCOUNTER — Other Ambulatory Visit: Payer: Self-pay | Admitting: Oncology

## 2024-04-28 ENCOUNTER — Telehealth: Payer: Self-pay | Admitting: *Deleted

## 2024-04-28 NOTE — Telephone Encounter (Signed)
 Case was presented at tumor board on 04/26/24. Called [name] with recommendations per Dr. Scherrie Curt: Per IR, no option for hepatic directed therapy such as Y90 or embolization. Per radiology--tumor too large for RT. Dr. Scherrie Curt suggests one of two drugs to be given based on his FGFR2 Fusion result. Goal is to shrink tumor, and then possibly radiation. F/U on 7/3 as scheduled. He understands and agrees.

## 2024-04-28 NOTE — Progress Notes (Signed)
 The proposed treatment discussed in conference is for discussion purpose only and is not a binding recommendation.  The patients have not been physically examined, or presented with their treatment options.  Therefore, final treatment plans cannot be decided.

## 2024-05-01 ENCOUNTER — Other Ambulatory Visit (HOSPITAL_BASED_OUTPATIENT_CLINIC_OR_DEPARTMENT_OTHER): Payer: Self-pay

## 2024-05-01 ENCOUNTER — Other Ambulatory Visit (HOSPITAL_COMMUNITY): Payer: Self-pay

## 2024-05-01 ENCOUNTER — Encounter: Payer: Self-pay | Admitting: Cardiology

## 2024-05-02 DIAGNOSIS — E782 Mixed hyperlipidemia: Secondary | ICD-10-CM | POA: Diagnosis not present

## 2024-05-02 DIAGNOSIS — E1165 Type 2 diabetes mellitus with hyperglycemia: Secondary | ICD-10-CM | POA: Diagnosis not present

## 2024-05-02 DIAGNOSIS — I2510 Atherosclerotic heart disease of native coronary artery without angina pectoris: Secondary | ICD-10-CM | POA: Diagnosis not present

## 2024-05-02 DIAGNOSIS — I10 Essential (primary) hypertension: Secondary | ICD-10-CM | POA: Diagnosis not present

## 2024-05-02 DIAGNOSIS — Z89421 Acquired absence of other right toe(s): Secondary | ICD-10-CM | POA: Diagnosis not present

## 2024-05-02 DIAGNOSIS — L97509 Non-pressure chronic ulcer of other part of unspecified foot with unspecified severity: Secondary | ICD-10-CM | POA: Diagnosis not present

## 2024-05-02 DIAGNOSIS — I739 Peripheral vascular disease, unspecified: Secondary | ICD-10-CM | POA: Diagnosis not present

## 2024-05-02 DIAGNOSIS — E7800 Pure hypercholesterolemia, unspecified: Secondary | ICD-10-CM | POA: Diagnosis not present

## 2024-05-02 DIAGNOSIS — N189 Chronic kidney disease, unspecified: Secondary | ICD-10-CM | POA: Diagnosis not present

## 2024-05-03 ENCOUNTER — Other Ambulatory Visit: Payer: Self-pay

## 2024-05-03 ENCOUNTER — Inpatient Hospital Stay (HOSPITAL_COMMUNITY)
Admission: EM | Admit: 2024-05-03 | Discharge: 2024-05-09 | DRG: 871 | Disposition: A | Discharge status: Home Health | Attending: Internal Medicine | Admitting: Internal Medicine

## 2024-05-03 ENCOUNTER — Encounter (HOSPITAL_COMMUNITY): Payer: Self-pay | Admitting: Emergency Medicine

## 2024-05-03 ENCOUNTER — Emergency Department (HOSPITAL_COMMUNITY)

## 2024-05-03 DIAGNOSIS — Z95810 Presence of automatic (implantable) cardiac defibrillator: Secondary | ICD-10-CM

## 2024-05-03 DIAGNOSIS — Z91048 Other nonmedicinal substance allergy status: Secondary | ICD-10-CM

## 2024-05-03 DIAGNOSIS — I429 Cardiomyopathy, unspecified: Secondary | ICD-10-CM | POA: Diagnosis not present

## 2024-05-03 DIAGNOSIS — C249 Malignant neoplasm of biliary tract, unspecified: Secondary | ICD-10-CM | POA: Diagnosis not present

## 2024-05-03 DIAGNOSIS — A419 Sepsis, unspecified organism: Principal | ICD-10-CM

## 2024-05-03 DIAGNOSIS — I10 Essential (primary) hypertension: Secondary | ICD-10-CM | POA: Diagnosis present

## 2024-05-03 DIAGNOSIS — R188 Other ascites: Secondary | ICD-10-CM | POA: Diagnosis present

## 2024-05-03 DIAGNOSIS — Z66 Do not resuscitate: Secondary | ICD-10-CM | POA: Diagnosis not present

## 2024-05-03 DIAGNOSIS — I255 Ischemic cardiomyopathy: Secondary | ICD-10-CM | POA: Diagnosis present

## 2024-05-03 DIAGNOSIS — R509 Fever, unspecified: Secondary | ICD-10-CM | POA: Diagnosis not present

## 2024-05-03 DIAGNOSIS — R570 Cardiogenic shock: Secondary | ICD-10-CM | POA: Diagnosis not present

## 2024-05-03 DIAGNOSIS — I130 Hypertensive heart and chronic kidney disease with heart failure and stage 1 through stage 4 chronic kidney disease, or unspecified chronic kidney disease: Secondary | ICD-10-CM | POA: Diagnosis not present

## 2024-05-03 DIAGNOSIS — Z7902 Long term (current) use of antithrombotics/antiplatelets: Secondary | ICD-10-CM

## 2024-05-03 DIAGNOSIS — R112 Nausea with vomiting, unspecified: Secondary | ICD-10-CM | POA: Diagnosis not present

## 2024-05-03 DIAGNOSIS — Z83438 Family history of other disorder of lipoprotein metabolism and other lipidemia: Secondary | ICD-10-CM

## 2024-05-03 DIAGNOSIS — Z79899 Other long term (current) drug therapy: Secondary | ICD-10-CM

## 2024-05-03 DIAGNOSIS — R0902 Hypoxemia: Secondary | ICD-10-CM | POA: Diagnosis present

## 2024-05-03 DIAGNOSIS — I252 Old myocardial infarction: Secondary | ICD-10-CM

## 2024-05-03 DIAGNOSIS — A401 Sepsis due to streptococcus, group B: Secondary | ICD-10-CM | POA: Diagnosis not present

## 2024-05-03 DIAGNOSIS — E1169 Type 2 diabetes mellitus with other specified complication: Secondary | ICD-10-CM | POA: Diagnosis not present

## 2024-05-03 DIAGNOSIS — I499 Cardiac arrhythmia, unspecified: Secondary | ICD-10-CM | POA: Diagnosis not present

## 2024-05-03 DIAGNOSIS — I5022 Chronic systolic (congestive) heart failure: Secondary | ICD-10-CM | POA: Diagnosis not present

## 2024-05-03 DIAGNOSIS — I739 Peripheral vascular disease, unspecified: Secondary | ICD-10-CM | POA: Diagnosis not present

## 2024-05-03 DIAGNOSIS — E872 Acidosis, unspecified: Secondary | ICD-10-CM | POA: Diagnosis present

## 2024-05-03 DIAGNOSIS — R7881 Bacteremia: Secondary | ICD-10-CM | POA: Diagnosis present

## 2024-05-03 DIAGNOSIS — I5084 End stage heart failure: Secondary | ICD-10-CM | POA: Diagnosis not present

## 2024-05-03 DIAGNOSIS — Z89511 Acquired absence of right leg below knee: Secondary | ICD-10-CM | POA: Diagnosis not present

## 2024-05-03 DIAGNOSIS — N1832 Chronic kidney disease, stage 3b: Secondary | ICD-10-CM | POA: Diagnosis not present

## 2024-05-03 DIAGNOSIS — Z8249 Family history of ischemic heart disease and other diseases of the circulatory system: Secondary | ICD-10-CM

## 2024-05-03 DIAGNOSIS — N183 Chronic kidney disease, stage 3 unspecified: Secondary | ICD-10-CM | POA: Diagnosis not present

## 2024-05-03 DIAGNOSIS — I5023 Acute on chronic systolic (congestive) heart failure: Secondary | ICD-10-CM | POA: Diagnosis not present

## 2024-05-03 DIAGNOSIS — R6521 Severe sepsis with septic shock: Secondary | ICD-10-CM | POA: Diagnosis not present

## 2024-05-03 DIAGNOSIS — Z515 Encounter for palliative care: Secondary | ICD-10-CM | POA: Diagnosis not present

## 2024-05-03 DIAGNOSIS — E781 Pure hyperglyceridemia: Secondary | ICD-10-CM | POA: Diagnosis not present

## 2024-05-03 DIAGNOSIS — E1151 Type 2 diabetes mellitus with diabetic peripheral angiopathy without gangrene: Secondary | ICD-10-CM | POA: Diagnosis not present

## 2024-05-03 DIAGNOSIS — B951 Streptococcus, group B, as the cause of diseases classified elsewhere: Secondary | ICD-10-CM | POA: Diagnosis not present

## 2024-05-03 DIAGNOSIS — I4729 Other ventricular tachycardia: Secondary | ICD-10-CM | POA: Diagnosis not present

## 2024-05-03 DIAGNOSIS — I38 Endocarditis, valve unspecified: Secondary | ICD-10-CM | POA: Diagnosis not present

## 2024-05-03 DIAGNOSIS — Z794 Long term (current) use of insulin: Secondary | ICD-10-CM

## 2024-05-03 DIAGNOSIS — R0689 Other abnormalities of breathing: Secondary | ICD-10-CM | POA: Diagnosis not present

## 2024-05-03 DIAGNOSIS — Z91040 Latex allergy status: Secondary | ICD-10-CM

## 2024-05-03 DIAGNOSIS — I517 Cardiomegaly: Secondary | ICD-10-CM | POA: Diagnosis not present

## 2024-05-03 DIAGNOSIS — Z792 Long term (current) use of antibiotics: Secondary | ICD-10-CM

## 2024-05-03 DIAGNOSIS — J90 Pleural effusion, not elsewhere classified: Secondary | ICD-10-CM | POA: Diagnosis not present

## 2024-05-03 DIAGNOSIS — R6520 Severe sepsis without septic shock: Secondary | ICD-10-CM | POA: Diagnosis present

## 2024-05-03 DIAGNOSIS — I2510 Atherosclerotic heart disease of native coronary artery without angina pectoris: Secondary | ICD-10-CM | POA: Diagnosis not present

## 2024-05-03 DIAGNOSIS — I5020 Unspecified systolic (congestive) heart failure: Secondary | ICD-10-CM | POA: Diagnosis not present

## 2024-05-03 DIAGNOSIS — Z8505 Personal history of malignant neoplasm of liver: Secondary | ICD-10-CM

## 2024-05-03 DIAGNOSIS — I700 Atherosclerosis of aorta: Secondary | ICD-10-CM | POA: Diagnosis present

## 2024-05-03 DIAGNOSIS — J9811 Atelectasis: Secondary | ICD-10-CM | POA: Diagnosis present

## 2024-05-03 DIAGNOSIS — Z604 Social exclusion and rejection: Secondary | ICD-10-CM | POA: Diagnosis present

## 2024-05-03 DIAGNOSIS — E1122 Type 2 diabetes mellitus with diabetic chronic kidney disease: Secondary | ICD-10-CM | POA: Diagnosis not present

## 2024-05-03 DIAGNOSIS — I080 Rheumatic disorders of both mitral and aortic valves: Secondary | ICD-10-CM | POA: Diagnosis not present

## 2024-05-03 DIAGNOSIS — N400 Enlarged prostate without lower urinary tract symptoms: Secondary | ICD-10-CM | POA: Diagnosis present

## 2024-05-03 DIAGNOSIS — I129 Hypertensive chronic kidney disease with stage 1 through stage 4 chronic kidney disease, or unspecified chronic kidney disease: Secondary | ICD-10-CM | POA: Diagnosis present

## 2024-05-03 DIAGNOSIS — Z87891 Personal history of nicotine dependence: Secondary | ICD-10-CM

## 2024-05-03 DIAGNOSIS — R059 Cough, unspecified: Secondary | ICD-10-CM | POA: Diagnosis not present

## 2024-05-03 DIAGNOSIS — R000 Tachycardia, unspecified: Secondary | ICD-10-CM | POA: Diagnosis not present

## 2024-05-03 DIAGNOSIS — E119 Type 2 diabetes mellitus without complications: Secondary | ICD-10-CM | POA: Diagnosis not present

## 2024-05-03 DIAGNOSIS — Z1152 Encounter for screening for COVID-19: Secondary | ICD-10-CM

## 2024-05-03 DIAGNOSIS — Z833 Family history of diabetes mellitus: Secondary | ICD-10-CM

## 2024-05-03 DIAGNOSIS — I330 Acute and subacute infective endocarditis: Secondary | ICD-10-CM | POA: Diagnosis not present

## 2024-05-03 DIAGNOSIS — Z7985 Long-term (current) use of injectable non-insulin antidiabetic drugs: Secondary | ICD-10-CM

## 2024-05-03 DIAGNOSIS — Z91013 Allergy to seafood: Secondary | ICD-10-CM

## 2024-05-03 DIAGNOSIS — Z822 Family history of deafness and hearing loss: Secondary | ICD-10-CM

## 2024-05-03 DIAGNOSIS — C221 Intrahepatic bile duct carcinoma: Secondary | ICD-10-CM | POA: Diagnosis present

## 2024-05-03 DIAGNOSIS — Z888 Allergy status to other drugs, medicaments and biological substances status: Secondary | ICD-10-CM

## 2024-05-03 MED ORDER — ONDANSETRON HCL 4 MG/2ML IJ SOLN
4.0000 mg | Freq: Once | INTRAMUSCULAR | Status: DC
  Filled 2024-05-03: qty 2

## 2024-05-03 MED ORDER — SODIUM CHLORIDE 0.9 % IV SOLN
2.0000 g | Freq: Once | INTRAVENOUS | Status: AC
  Administered 2024-05-04: 2 g via INTRAVENOUS
  Filled 2024-05-03: qty 12.5

## 2024-05-03 MED ORDER — METRONIDAZOLE 500 MG/100ML IV SOLN
500.0000 mg | Freq: Once | INTRAVENOUS | Status: AC
  Administered 2024-05-04: 500 mg via INTRAVENOUS
  Filled 2024-05-03: qty 100

## 2024-05-03 MED ORDER — ACETAMINOPHEN 325 MG PO TABS
650.0000 mg | ORAL_TABLET | Freq: Once | ORAL | Status: AC
  Administered 2024-05-04: 650 mg via ORAL
  Filled 2024-05-03: qty 2

## 2024-05-03 MED ORDER — VANCOMYCIN HCL IN DEXTROSE 1-5 GM/200ML-% IV SOLN
1000.0000 mg | Freq: Once | INTRAVENOUS | Status: AC
  Administered 2024-05-04: 1000 mg via INTRAVENOUS
  Filled 2024-05-03: qty 200

## 2024-05-03 NOTE — ED Provider Notes (Signed)
 [name] EMERGENCY DEPARTMENT AT [facility] Provider Note   CSN: [id number] Arrival date & time: 05/03/24  2309     Patient presents with: Code Sepsis   Eduardo [name] is a 68 y.o. male.  {Add pertinent medical, surgical, social history, OB history to YEP:67052} The history is provided by the patient and medical records.  Eduardo [name] is a 68 y.o. male who presents to the Emergency Department complaining of *** Cold chills for 1.5 hrs No pain. 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   Prior to Admission medications   Medication Sig Start Date End Date Taking? Authorizing Provider  amiodarone  (PACERONE ) 200 MG tablet Take 1 tablet (200 mg total) by mouth 2 (two) times daily for 7 days, THEN 1 tablet (200 mg total) daily thereafter 04/04/24 06/01/24  Lenetta No D, NP  amiodarone  (PACERONE ) 200 MG tablet Take 200 mg by mouth daily.    [provider]  Bioflavonoid Products (ESTER C PO) Take 1,000 mg by mouth daily.    [provider]  Cholecalciferol  (VITAMIN D ) 2000 units CAPS Take 2,000 Units by mouth daily.    [provider]  clopidogrel  (PLAVIX ) 75 MG tablet TAKE 1 TABLET BY MOUTH DAILY 04/21/24   Patwardhan, Newman PARAS, MD  Continuous Glucose Sensor (FREESTYLE LIBRE 2 PLUS SENSOR) MISC 1 Application. 03/20/24   [provider]  ezetimibe  (ZETIA ) 10 MG tablet TAKE 1 TABLET BY MOUTH DAILY 04/21/24   Patwardhan, Newman PARAS, MD  famotidine  (PEPCID ) 10 MG tablet Take 10 mg by mouth 2 (two) times daily.    [provider]  gemfibrozil  (LOPID ) 600 MG tablet Take 600 mg by mouth 2 (two) times daily before a meal.    [provider]  HUMALOG KWIKPEN 100 UNIT/ML KiwkPen Inject 3-10 Units into the skin 3 (three) times daily. Inject 01-1009 units into the skin three times a day, per sliding scale- based on BGL >100 08/02/16   [provider]  HUMULIN  N KWIKPEN 100 UNIT/ML KwikPen Inject 6 Units  into the skin daily. 03/20/24   [provider]  Anselm Oil 300 MG CAPS Take 500 mg by mouth daily.    [provider]  lidocaine  (XYLOCAINE ) 5 % ointment Apply 1 Application topically as needed. For pain as needed- can use up to 4x/day 01/03/24   Lovorn, Megan, MD  melatonin 10 MG TABS Take 10 mg by mouth at bedtime. 03/31/22   Setzer, Sandra J, PA-C  midodrine  (PROAMATINE ) 5 MG tablet Take 3 tablets (15 mg total) by mouth 3 (three) times daily with meals. 04/04/24   Clegg, Amy D, NP  Multiple Vitamin (MULTIVITAMIN WITH MINERALS) TABS tablet Take 1 tablet by mouth daily.    [provider]  nitroGLYCERIN  (NITROSTAT ) 0.4 MG SL tablet Place 1 tablet (0.4 mg total) under the tongue every 5 (five) minutes as needed for chest pain. 05/08/21   Cantwell, Celeste C, PA-C  potassium chloride  SA (KLOR-CON  M) 20 MEQ tablet Take 2 tablets (40 mEq total) by mouth daily. 04/18/24   Colletta Manuelita Garre, PA-C  Probiotic Product (PROBIOTIC-10 PO) Take 1 capsule by mouth daily.    [provider]  rosuvastatin  (CRESTOR ) 20 MG tablet Take 1 tablet (20 mg total) by mouth daily. 07/22/20   Ladona Heinz, MD  sodium chloride  (OCEAN) 0.65 % SOLN nasal spray Place 1 spray into both nostrils as needed for congestion.    [provider]  tamsulosin  (FLOMAX ) 0.4 MG CAPS capsule Take 1 capsule (0.4 mg total) by mouth daily after supper. 04/04/24   Clegg, Amy D, NP  tirzepatide [facility]) 12.5 MG/0.5ML Pen Inject 12.5 mg into the skin once a week. Patient not taking: Reported on 04/24/2024    [provider]  torsemide  (DEMADEX ) 20 MG tablet Take 3 tablets (60 mg total) by mouth 2 (two) times daily. 04/18/24   Colletta Manuelita Garre, PA-C    Allergies: Empagliflozin , Shellfish-derived products, Latex, Tape, and Testosterone     Review of Systems  All other systems reviewed and are negative.   Updated Vital Signs There were no vitals taken for this visit.  Physical Exam Vitals  and nursing note reviewed.  Constitutional:      Appearance: He is well-developed.  HENT:     Head: Normocephalic and atraumatic.   Cardiovascular:     Rate and Rhythm: Regular rhythm. Tachycardia present.     Heart sounds: No murmur heard. Pulmonary:     Effort: Pulmonary effort is normal.     Comments: tachypnea Abdominal:     Palpations: Abdomen is soft.     Tenderness: There is no abdominal tenderness. There is no guarding or rebound.   Musculoskeletal:        General: No tenderness.     Comments: 2-3+ pitting edema to LLE.  Left BKA   Skin:    General: Skin is warm and dry.   Neurological:     Mental Status: He is alert and oriented to person, place, and time.   Psychiatric:        Behavior: Behavior normal.     (all labs ordered are listed, but only abnormal results are displayed) Labs Reviewed  CULTURE, BLOOD (ROUTINE X 2)  CULTURE, BLOOD (ROUTINE X 2)  COMPREHENSIVE METABOLIC PANEL WITH GFR  CBC WITH DIFFERENTIAL/PLATELET  PROTIME-INR  URINALYSIS, W/ REFLEX TO CULTURE (INFECTION SUSPECTED)  I-STAT CG4 LACTIC ACID, ED    EKG: None  Radiology: No results found.  {Document cardiac monitor, telemetry assessment procedure when appropriate:32947} Procedures   Medications Ordered in the ED - No data to display    {Click here for ABCD2, HEART and other calculators REFRESH Note before signing:1}                              Medical Decision Making Amount and/or Complexity of Data Reviewed Labs: ordered. Radiology: ordered.   ***  {Document critical care time when appropriate  Document review of labs and clinical decision tools ie CHADS2VASC2, etc  Document your independent review of radiology images and any outside records  Document your discussion with family members, caretakers and with consultants  Document social determinants of health affecting pt's care  Document your decision making why or why not admission, treatments were needed:32947:::1}    Final diagnoses:  None    ED Discharge Orders     None

## 2024-05-03 NOTE — ED Notes (Signed)
Condom catheter placed on pt.

## 2024-05-03 NOTE — Progress Notes (Signed)
 Elink monitoring for the code sepsis protocol.

## 2024-05-03 NOTE — ED Triage Notes (Addendum)
 Pt arrives from home W/ [facility] meeting sepsis criteria. Pt had sudden onset of cold chills. Febrile to touch. Hx bone infection RAKI. Recently dx w/ liver cancer.  23 capnography  45 resp 130/90 BP  110 HR  A&O x 4 Nauseous upon arrival refused oral tylenol  en route

## 2024-05-04 ENCOUNTER — Inpatient Hospital Stay (HOSPITAL_COMMUNITY)

## 2024-05-04 ENCOUNTER — Emergency Department (HOSPITAL_COMMUNITY)

## 2024-05-04 DIAGNOSIS — E1169 Type 2 diabetes mellitus with other specified complication: Secondary | ICD-10-CM | POA: Diagnosis not present

## 2024-05-04 DIAGNOSIS — Z794 Long term (current) use of insulin: Secondary | ICD-10-CM | POA: Diagnosis not present

## 2024-05-04 DIAGNOSIS — Z515 Encounter for palliative care: Secondary | ICD-10-CM | POA: Diagnosis not present

## 2024-05-04 DIAGNOSIS — R6520 Severe sepsis without septic shock: Secondary | ICD-10-CM

## 2024-05-04 DIAGNOSIS — J90 Pleural effusion, not elsewhere classified: Secondary | ICD-10-CM | POA: Diagnosis not present

## 2024-05-04 DIAGNOSIS — C221 Intrahepatic bile duct carcinoma: Secondary | ICD-10-CM | POA: Diagnosis not present

## 2024-05-04 DIAGNOSIS — I2510 Atherosclerotic heart disease of native coronary artery without angina pectoris: Secondary | ICD-10-CM

## 2024-05-04 DIAGNOSIS — N183 Chronic kidney disease, stage 3 unspecified: Secondary | ICD-10-CM | POA: Diagnosis not present

## 2024-05-04 DIAGNOSIS — I38 Endocarditis, valve unspecified: Secondary | ICD-10-CM | POA: Diagnosis not present

## 2024-05-04 DIAGNOSIS — E119 Type 2 diabetes mellitus without complications: Secondary | ICD-10-CM | POA: Diagnosis not present

## 2024-05-04 DIAGNOSIS — R6521 Severe sepsis with septic shock: Secondary | ICD-10-CM | POA: Diagnosis not present

## 2024-05-04 DIAGNOSIS — A419 Sepsis, unspecified organism: Secondary | ICD-10-CM

## 2024-05-04 DIAGNOSIS — E781 Pure hyperglyceridemia: Secondary | ICD-10-CM | POA: Diagnosis present

## 2024-05-04 DIAGNOSIS — A401 Sepsis due to streptococcus, group B: Secondary | ICD-10-CM | POA: Diagnosis not present

## 2024-05-04 DIAGNOSIS — R188 Other ascites: Secondary | ICD-10-CM | POA: Diagnosis not present

## 2024-05-04 DIAGNOSIS — E1151 Type 2 diabetes mellitus with diabetic peripheral angiopathy without gangrene: Secondary | ICD-10-CM | POA: Diagnosis present

## 2024-05-04 DIAGNOSIS — I255 Ischemic cardiomyopathy: Secondary | ICD-10-CM | POA: Diagnosis present

## 2024-05-04 DIAGNOSIS — I429 Cardiomyopathy, unspecified: Secondary | ICD-10-CM | POA: Diagnosis not present

## 2024-05-04 DIAGNOSIS — R059 Cough, unspecified: Secondary | ICD-10-CM | POA: Diagnosis not present

## 2024-05-04 DIAGNOSIS — N400 Enlarged prostate without lower urinary tract symptoms: Secondary | ICD-10-CM | POA: Diagnosis present

## 2024-05-04 DIAGNOSIS — I5020 Unspecified systolic (congestive) heart failure: Secondary | ICD-10-CM | POA: Diagnosis not present

## 2024-05-04 DIAGNOSIS — I330 Acute and subacute infective endocarditis: Secondary | ICD-10-CM | POA: Diagnosis not present

## 2024-05-04 DIAGNOSIS — Z66 Do not resuscitate: Secondary | ICD-10-CM | POA: Diagnosis present

## 2024-05-04 DIAGNOSIS — I700 Atherosclerosis of aorta: Secondary | ICD-10-CM | POA: Diagnosis present

## 2024-05-04 DIAGNOSIS — E1122 Type 2 diabetes mellitus with diabetic chronic kidney disease: Secondary | ICD-10-CM | POA: Diagnosis present

## 2024-05-04 DIAGNOSIS — I517 Cardiomegaly: Secondary | ICD-10-CM | POA: Diagnosis not present

## 2024-05-04 DIAGNOSIS — R590 Localized enlarged lymph nodes: Secondary | ICD-10-CM | POA: Diagnosis not present

## 2024-05-04 DIAGNOSIS — R7881 Bacteremia: Secondary | ICD-10-CM | POA: Diagnosis not present

## 2024-05-04 DIAGNOSIS — I252 Old myocardial infarction: Secondary | ICD-10-CM | POA: Diagnosis not present

## 2024-05-04 DIAGNOSIS — E872 Acidosis, unspecified: Secondary | ICD-10-CM | POA: Diagnosis present

## 2024-05-04 DIAGNOSIS — R160 Hepatomegaly, not elsewhere classified: Secondary | ICD-10-CM | POA: Diagnosis not present

## 2024-05-04 DIAGNOSIS — I080 Rheumatic disorders of both mitral and aortic valves: Secondary | ICD-10-CM | POA: Diagnosis present

## 2024-05-04 DIAGNOSIS — I4729 Other ventricular tachycardia: Secondary | ICD-10-CM | POA: Diagnosis not present

## 2024-05-04 DIAGNOSIS — N1832 Chronic kidney disease, stage 3b: Secondary | ICD-10-CM | POA: Diagnosis not present

## 2024-05-04 DIAGNOSIS — I5084 End stage heart failure: Secondary | ICD-10-CM | POA: Diagnosis not present

## 2024-05-04 DIAGNOSIS — I130 Hypertensive heart and chronic kidney disease with heart failure and stage 1 through stage 4 chronic kidney disease, or unspecified chronic kidney disease: Secondary | ICD-10-CM | POA: Diagnosis present

## 2024-05-04 DIAGNOSIS — B951 Streptococcus, group B, as the cause of diseases classified elsewhere: Secondary | ICD-10-CM | POA: Diagnosis not present

## 2024-05-04 DIAGNOSIS — I10 Essential (primary) hypertension: Secondary | ICD-10-CM | POA: Diagnosis not present

## 2024-05-04 DIAGNOSIS — J9811 Atelectasis: Secondary | ICD-10-CM | POA: Diagnosis not present

## 2024-05-04 DIAGNOSIS — I5022 Chronic systolic (congestive) heart failure: Secondary | ICD-10-CM | POA: Diagnosis not present

## 2024-05-04 DIAGNOSIS — Z1152 Encounter for screening for COVID-19: Secondary | ICD-10-CM | POA: Diagnosis not present

## 2024-05-04 DIAGNOSIS — C249 Malignant neoplasm of biliary tract, unspecified: Secondary | ICD-10-CM

## 2024-05-04 DIAGNOSIS — I5023 Acute on chronic systolic (congestive) heart failure: Secondary | ICD-10-CM | POA: Diagnosis not present

## 2024-05-04 DIAGNOSIS — R509 Fever, unspecified: Secondary | ICD-10-CM | POA: Diagnosis not present

## 2024-05-04 DIAGNOSIS — Z95810 Presence of automatic (implantable) cardiac defibrillator: Secondary | ICD-10-CM | POA: Diagnosis not present

## 2024-05-04 DIAGNOSIS — Z89511 Acquired absence of right leg below knee: Secondary | ICD-10-CM | POA: Diagnosis not present

## 2024-05-04 DIAGNOSIS — I129 Hypertensive chronic kidney disease with stage 1 through stage 4 chronic kidney disease, or unspecified chronic kidney disease: Secondary | ICD-10-CM | POA: Diagnosis present

## 2024-05-04 DIAGNOSIS — I739 Peripheral vascular disease, unspecified: Secondary | ICD-10-CM | POA: Diagnosis not present

## 2024-05-04 DIAGNOSIS — R570 Cardiogenic shock: Secondary | ICD-10-CM | POA: Diagnosis present

## 2024-05-04 LAB — URINALYSIS, W/ REFLEX TO CULTURE (INFECTION SUSPECTED)
Bilirubin Urine: NEGATIVE
Glucose, UA: NEGATIVE mg/dL
Hgb urine dipstick: NEGATIVE
Ketones, ur: NEGATIVE mg/dL
Leukocytes,Ua: NEGATIVE
Nitrite: NEGATIVE
Protein, ur: NEGATIVE mg/dL
Specific Gravity, Urine: 1.014 (ref 1.005–1.030)
pH: 5 (ref 5.0–8.0)

## 2024-05-04 LAB — CBC WITH DIFFERENTIAL/PLATELET
Abs Immature Granulocytes: 0.06 10*3/uL (ref 0.00–0.07)
Abs Immature Granulocytes: 0.1 10*3/uL — ABNORMAL HIGH (ref 0.00–0.07)
Basophils Absolute: 0 10*3/uL (ref 0.0–0.1)
Basophils Absolute: 0 10*3/uL (ref 0.0–0.1)
Basophils Relative: 0 %
Basophils Relative: 0 %
Eosinophils Absolute: 0 10*3/uL (ref 0.0–0.5)
Eosinophils Absolute: 0 10*3/uL (ref 0.0–0.5)
Eosinophils Relative: 0 %
Eosinophils Relative: 0 %
HCT: 37.7 % — ABNORMAL LOW (ref 39.0–52.0)
HCT: 44.8 % (ref 39.0–52.0)
Hemoglobin: 11.9 g/dL — ABNORMAL LOW (ref 13.0–17.0)
Hemoglobin: 14 g/dL (ref 13.0–17.0)
Immature Granulocytes: 1 %
Immature Granulocytes: 1 %
Lymphocytes Relative: 2 %
Lymphocytes Relative: 3 %
Lymphs Abs: 0.3 10*3/uL — ABNORMAL LOW (ref 0.7–4.0)
Lymphs Abs: 0.3 10*3/uL — ABNORMAL LOW (ref 0.7–4.0)
MCH: 28.1 pg (ref 26.0–34.0)
MCH: 28.3 pg (ref 26.0–34.0)
MCHC: 31.3 g/dL (ref 30.0–36.0)
MCHC: 31.6 g/dL (ref 30.0–36.0)
MCV: 88.9 fL (ref 80.0–100.0)
MCV: 90.5 fL (ref 80.0–100.0)
Monocytes Absolute: 0.4 10*3/uL (ref 0.1–1.0)
Monocytes Absolute: 0.9 10*3/uL (ref 0.1–1.0)
Monocytes Relative: 3 %
Monocytes Relative: 6 %
Neutro Abs: 11.7 10*3/uL — ABNORMAL HIGH (ref 1.7–7.7)
Neutro Abs: 13.6 10*3/uL — ABNORMAL HIGH (ref 1.7–7.7)
Neutrophils Relative %: 91 %
Neutrophils Relative %: 93 %
Platelets: 162 10*3/uL (ref 150–400)
Platelets: 222 10*3/uL (ref 150–400)
RBC: 4.24 MIL/uL (ref 4.22–5.81)
RBC: 4.95 MIL/uL (ref 4.22–5.81)
RDW: 18.6 % — ABNORMAL HIGH (ref 11.5–15.5)
RDW: 18.8 % — ABNORMAL HIGH (ref 11.5–15.5)
WBC: 12.6 10*3/uL — ABNORMAL HIGH (ref 4.0–10.5)
WBC: 14.9 10*3/uL — ABNORMAL HIGH (ref 4.0–10.5)
nRBC: 0 % (ref 0.0–0.2)
nRBC: 0 % (ref 0.0–0.2)

## 2024-05-04 LAB — COMPREHENSIVE METABOLIC PANEL WITH GFR
ALT: 16 U/L (ref 0–44)
ALT: 17 U/L (ref 0–44)
AST: 34 U/L (ref 15–41)
AST: 36 U/L (ref 15–41)
Albumin: 3.5 g/dL (ref 3.5–5.0)
Albumin: 3.2 g/dL — ABNORMAL LOW (ref 3.5–5.0)
Alkaline Phosphatase: 118 U/L (ref 38–126)
Alkaline Phosphatase: 100 U/L (ref 38–126)
Anion gap: 17 — ABNORMAL HIGH (ref 5–15)
Anion gap: 15 (ref 5–15)
BUN: 43 mg/dL — ABNORMAL HIGH (ref 8–23)
BUN: 45 mg/dL — ABNORMAL HIGH (ref 8–23)
CO2: 23 mmol/L (ref 22–32)
CO2: 25 mmol/L (ref 22–32)
Calcium: 9.4 mg/dL (ref 8.9–10.3)
Calcium: 9 mg/dL (ref 8.9–10.3)
Chloride: 99 mmol/L (ref 98–111)
Chloride: 98 mmol/L (ref 98–111)
Creatinine, Ser: 1.64 mg/dL — ABNORMAL HIGH (ref 0.61–1.24)
Creatinine, Ser: 1.71 mg/dL — ABNORMAL HIGH (ref 0.61–1.24)
GFR, Estimated: 45 mL/min — ABNORMAL LOW (ref >60)
GFR, Estimated: 43 mL/min — ABNORMAL LOW (ref >60)
Glucose, Bld: 272 mg/dL — ABNORMAL HIGH (ref 70–99)
Glucose, Bld: 212 mg/dL — ABNORMAL HIGH (ref 70–99)
Potassium: 4.4 mmol/L (ref 3.5–5.1)
Potassium: 3.6 mmol/L (ref 3.5–5.1)
Sodium: 139 mmol/L (ref 135–145)
Sodium: 138 mmol/L (ref 135–145)
Total Bilirubin: 1.3 mg/dL — ABNORMAL HIGH (ref 0.0–1.2)
Total Bilirubin: 1.5 mg/dL — ABNORMAL HIGH (ref 0.0–1.2)
Total Protein: 6.5 g/dL (ref 6.5–8.1)
Total Protein: 5.8 g/dL — ABNORMAL LOW (ref 6.5–8.1)

## 2024-05-04 LAB — RESPIRATORY PANEL BY PCR

## 2024-05-04 LAB — RESP PANEL BY RT-PCR (RSV, FLU A&B, COVID)  RVPGX2
Influenza A by PCR: NEGATIVE
Influenza B by PCR: NEGATIVE
Resp Syncytial Virus by PCR: NEGATIVE
SARS Coronavirus 2 by RT PCR: NEGATIVE

## 2024-05-04 LAB — MAGNESIUM: Magnesium: 2.1 mg/dL (ref 1.7–2.4)

## 2024-05-04 LAB — GLUCOSE, CAPILLARY
Glucose-Capillary: 243 mg/dL — ABNORMAL HIGH (ref 70–99)
Glucose-Capillary: 247 mg/dL — ABNORMAL HIGH (ref 70–99)
Glucose-Capillary: 218 mg/dL — ABNORMAL HIGH (ref 70–99)

## 2024-05-04 LAB — I-STAT CG4 LACTIC ACID, ED
Lactic Acid, Venous: 2.3 mmol/L (ref 0.5–1.9)
Lactic Acid, Venous: 3.2 mmol/L (ref 0.5–1.9)

## 2024-05-04 LAB — BRAIN NATRIURETIC PEPTIDE
B Natriuretic Peptide: 3479.7 pg/mL — ABNORMAL HIGH (ref 0.0–100.0)
B Natriuretic Peptide: >4500 pg/mL — ABNORMAL HIGH (ref 0.0–100.0)

## 2024-05-04 LAB — PROCALCITONIN: Procalcitonin: 13.47 ng/mL

## 2024-05-04 LAB — MRSA NEXT GEN BY PCR, NASAL: MRSA by PCR Next Gen: NOT DETECTED

## 2024-05-04 LAB — PROTIME-INR
INR: 1.2 (ref 0.8–1.2)
INR: 1.3 — ABNORMAL HIGH (ref 0.8–1.2)
Prothrombin Time: 16 s — ABNORMAL HIGH (ref 11.4–15.2)
Prothrombin Time: 16.6 s — ABNORMAL HIGH (ref 11.4–15.2)

## 2024-05-04 LAB — LACTIC ACID, PLASMA: Lactic Acid, Venous: 1.8 mmol/L (ref 0.5–1.9)

## 2024-05-04 MED ORDER — ONDANSETRON HCL 4 MG/2ML IJ SOLN
4.0000 mg | Freq: Four times a day (QID) | INTRAMUSCULAR | Status: DC | PRN
  Administered 2024-05-05: 4 mg via INTRAVENOUS
  Filled 2024-05-04: qty 2

## 2024-05-04 MED ORDER — IOHEXOL 9 MG/ML PO SOLN
500.0000 mL | ORAL | Status: AC
  Administered 2024-05-04 (×2): 500 mL via ORAL

## 2024-05-04 MED ORDER — SODIUM CHLORIDE 0.9 % IV SOLN
250.0000 mL | INTRAVENOUS | Status: AC
  Administered 2024-05-04: 250 mL via INTRAVENOUS

## 2024-05-04 MED ORDER — CALCIUM CARBONATE ANTACID 500 MG PO CHEW
1.0000 | CHEWABLE_TABLET | ORAL | Status: DC | PRN
  Administered 2024-05-04: 200 mg via ORAL
  Filled 2024-05-04: qty 1

## 2024-05-04 MED ORDER — ACETAMINOPHEN 650 MG RE SUPP: 650.0000 mg | Freq: Four times a day (QID) | RECTAL | Status: DC | PRN

## 2024-05-04 MED ORDER — SODIUM CHLORIDE 0.9 % IV BOLUS
250.0000 mL | Freq: Once | INTRAVENOUS | Status: AC
  Administered 2024-05-04: 250 mL via INTRAVENOUS

## 2024-05-04 MED ORDER — MIDODRINE HCL 5 MG PO TABS
15.0000 mg | ORAL_TABLET | Freq: Once | ORAL | Status: AC
  Administered 2024-05-04: 15 mg via ORAL
  Filled 2024-05-04: qty 3

## 2024-05-04 MED ORDER — NOREPINEPHRINE 4 MG/250ML-% IV SOLN
0.0000 ug/min | INTRAVENOUS | Status: DC
  Administered 2024-05-04: 2 ug/min via INTRAVENOUS
  Filled 2024-05-04: qty 250

## 2024-05-04 MED ORDER — IOHEXOL 9 MG/ML PO SOLN
ORAL | Status: AC
  Filled 2024-05-04: qty 1000

## 2024-05-04 MED ORDER — INSULIN ASPART 100 UNIT/ML IJ SOLN
0.0000 [IU] | Freq: Three times a day (TID) | INTRAMUSCULAR | Status: DC
  Administered 2024-05-04 (×2): 5 [IU] via SUBCUTANEOUS
  Administered 2024-05-05: 3 [IU] via SUBCUTANEOUS
  Administered 2024-05-05: 8 [IU] via SUBCUTANEOUS
  Administered 2024-05-05: 5 [IU] via SUBCUTANEOUS
  Administered 2024-05-06: 3 [IU] via SUBCUTANEOUS
  Administered 2024-05-06: 5 [IU] via SUBCUTANEOUS
  Administered 2024-05-06 – 2024-05-07 (×2): 3 [IU] via SUBCUTANEOUS
  Administered 2024-05-07 – 2024-05-08 (×2): 8 [IU] via SUBCUTANEOUS
  Administered 2024-05-08: 3 [IU] via SUBCUTANEOUS
  Administered 2024-05-08: 8 [IU] via SUBCUTANEOUS
  Administered 2024-05-09: 5 [IU] via SUBCUTANEOUS
  Administered 2024-05-09: 3 [IU] via SUBCUTANEOUS

## 2024-05-04 MED ORDER — MIDODRINE HCL 5 MG PO TABS
15.0000 mg | ORAL_TABLET | Freq: Three times a day (TID) | ORAL | Status: DC
  Administered 2024-05-04 – 2024-05-09 (×16): 15 mg via ORAL
  Filled 2024-05-04 (×16): qty 3

## 2024-05-04 MED ORDER — METRONIDAZOLE 500 MG/100ML IV SOLN
500.0000 mg | Freq: Two times a day (BID) | INTRAVENOUS | Status: DC
  Administered 2024-05-04 – 2024-05-05 (×3): 500 mg via INTRAVENOUS
  Filled 2024-05-04 (×3): qty 100

## 2024-05-04 MED ORDER — SODIUM CHLORIDE 0.9 % IV SOLN
2.0000 g | Freq: Two times a day (BID) | INTRAVENOUS | Status: DC
  Administered 2024-05-04 (×2): 2 g via INTRAVENOUS
  Filled 2024-05-04 (×2): qty 12.5

## 2024-05-04 MED ORDER — ACETAMINOPHEN 325 MG PO TABS: 650.0000 mg | ORAL_TABLET | Freq: Four times a day (QID) | ORAL | Status: DC | PRN

## 2024-05-04 MED ORDER — POLYETHYLENE GLYCOL 3350 17 G PO PACK
17.0000 g | PACK | Freq: Every day | ORAL | Status: DC | PRN
  Administered 2024-05-04 – 2024-05-05 (×2): 17 g via ORAL
  Filled 2024-05-04 (×2): qty 1

## 2024-05-04 MED ORDER — INSULIN ASPART 100 UNIT/ML IJ SOLN
0.0000 [IU] | Freq: Every day | INTRAMUSCULAR | Status: DC
  Administered 2024-05-04 – 2024-05-07 (×3): 2 [IU] via SUBCUTANEOUS

## 2024-05-04 MED ORDER — ALUM & MAG HYDROXIDE-SIMETH 200-200-20 MG/5ML PO SUSP
15.0000 mL | ORAL | Status: DC | PRN
  Administered 2024-05-04: 15 mL via ORAL
  Filled 2024-05-04 (×2): qty 30

## 2024-05-04 MED ORDER — SIMETHICONE 80 MG PO CHEW: 80.0000 mg | CHEWABLE_TABLET | Freq: Four times a day (QID) | ORAL | Status: DC | PRN

## 2024-05-04 MED ORDER — ORAL CARE MOUTH RINSE
15.0000 mL | OROMUCOSAL | Status: DC | PRN
Start: 2024-05-04 — End: 2024-05-09

## 2024-05-04 MED ORDER — VANCOMYCIN HCL IN DEXTROSE 1-5 GM/200ML-% IV SOLN
1000.0000 mg | INTRAVENOUS | Status: DC
  Administered 2024-05-04: 1000 mg via INTRAVENOUS
  Filled 2024-05-04: qty 200

## 2024-05-04 MED ORDER — MELATONIN 3 MG PO TABS
3.0000 mg | ORAL_TABLET | Freq: Every evening | ORAL | Status: DC | PRN
  Administered 2024-05-06 – 2024-05-08 (×2): 3 mg via ORAL
  Filled 2024-05-04 (×2): qty 1

## 2024-05-04 MED ORDER — PANTOPRAZOLE SODIUM 40 MG IV SOLR
40.0000 mg | INTRAVENOUS | Status: DC
  Administered 2024-05-04 – 2024-05-09 (×6): 40 mg via INTRAVENOUS
  Filled 2024-05-04 (×6): qty 10

## 2024-05-04 MED ORDER — CHLORHEXIDINE GLUCONATE CLOTH 2 % EX PADS
6.0000 | MEDICATED_PAD | Freq: Every day | CUTANEOUS | Status: DC
  Administered 2024-05-04 – 2024-05-09 (×5): 6 via TOPICAL

## 2024-05-04 NOTE — Assessment & Plan Note (Signed)
 SBP 80s-100s in setting of sepsis  Hold BP regimen  Cont midodrine 

## 2024-05-04 NOTE — Assessment & Plan Note (Signed)
 Noted recent diagnosis of cholangiocarcinoma  Not deemed to be a candidate for advanced therapies/surgical intervention  CT A&P pending in setting of sepsis to rule out overt intra-abdominal vs. Intra-biliary infection  Monitor

## 2024-05-04 NOTE — Assessment & Plan Note (Addendum)
 2D ECHO 03/2024 w/ EF 20-25%, RV moderately reduced s/p ICD  Noted admission 5/16-5/27 for IV diuresis with heart failure team (please see dc summary for full details)  Appears fairly euvolemic to mildly dry on exam though w/ noted narrow volume window  Weight today 95.1kg  Monitor volume status closely Consult cardiology as clinically indicated.

## 2024-05-04 NOTE — Assessment & Plan Note (Signed)
 Meeting sepsis criteria with Tmax 103.3, HR 100s, WBC 12.6  Lactate 3.2-->2.2  SBP 80s-100s  No identifiable source at present though CXR w/ ? L basilar atelectasis  Will check CT chest to better correlate  Started on empiric IV cefepime , flagyl  and vancomycin  for infectious coverage  Panculture  Minimal IVF in setting of low output heart failure and narrow hydration window Low threshold to consult critical care for co-management

## 2024-05-04 NOTE — Assessment & Plan Note (Addendum)
 Baseline Cr 1.5-1.7 w/ GFR in the 40s  Looks to be near baseline today  Monitor w/ treatment

## 2024-05-04 NOTE — H&P (Addendum)
 History and Physical    Patient: Eduardo [name] FMW:[id number] DOB: 08/28/1956 DOA: 05/03/2024 DOS: the patient was seen and examined on 05/04/2024 PCP: [name], MD  Patient coming from: Home  Chief Complaint:  Chief Complaint  Patient presents with   Code Sepsis   HPI: Eduardo [name] is a 68 y.o. male with medical history significant of chronic HFrEF, multivessel CAD, HTN, cholangiocarcinoma, PAD, type 2 diabetes, Stage 2-3 CKD presenting with sepsis.  Patient reports generalized malaise over the past 1 to 2 days.  Has had some intermittent cough.  Denies any shortness of breath.  No nausea or vomiting.  No body aches or chills.  Has had progressive weakness and fatigue over the same timeframe.  Denies any known sick contacts.  Does not report any active diarrhea.  No dysuria or increased urinary frequency at present.  No reported sneezing or coughing.  No reported rhinorrhea.  Patient has noted to have been recently on the heart failure service for acute on chronic systolic heart failure with low output.  Was noted to have been put on a Lasix  drip as well as transition to midodrine  to help with BP support.  Not on GDMT secondary to hypotension.  Per the patient, has been relatively stable from a fluid intake standpoint.  Has been compliant with home medication regimen.  Also plugged in with outpatient palliative care per report.  Denies any alcohol or tobacco use.  Also with recent diagnosis May of this year of cholangiocarcinoma.  Followed by oncology.  Not felt to be a surgical candidate at present. Presented to the ER Tmax of 103, heart rate 90s to 120s.  Respirations 20s, blood pressure 80s to 100s over 50s to 60s.  Satting in the mid 90s on room air though with noted transient hypoxia.  White count 12.6, hemoglobin 14, platelets 222, creatinine 1.64, glucose 272.  Urinalysis not indicative of infection.  COVID flu and RSV negative.  Lactate 3.2-2.3.  BNP of 3479.  Chest x-ray with increased left  effusion and basilar atelectasis. Review of Systems: As mentioned in the history of present illness. All other systems reviewed and are negative. Past Medical History:  Diagnosis Date   CHF (congestive heart failure) (HCC)    Chronic kidney disease    stage 3   Colon polyp    Coronary artery disease    Diabetes mellitus 1987   under care of Dr. Tommas.  On insulin  since 96 (off and on)   Diabetic retinopathy    Dupuytren contracture    R hand, s/p injection (Dr. Lorretta)   Essential hypertension, benign    Essential hypertension, benign 02/06/2019   Frequency of urination and polyuria    Hypertension    Myocardial infarction [facility])    denies   Neuromuscular disorder (HCC)    Diabetic neuropathy   Osteomyelitis (HCC)    right foot   Other testicular hypofunction    Peripheral arterial disease (HCC) 10/28/2012   Peritoneal abscess (HCC) 6/08   and buttock.   Pneumonia    Polydipsia    Proteinuria    Pure hyperglyceridemia    Subacute osteomyelitis, right ankle and foot (HCC)    Wears glasses    Past Surgical History:  Procedure Laterality Date   ABDOMINAL AORTAGRAM N/A 04/18/2012   Procedure: ABDOMINAL EZELLA;  Surgeon: Lonni GORMAN Blade, MD;  Location: [facility] CATH LAB;  Service: Cardiovascular;  Laterality: N/A;   AMPUTATION Right 05/19/2019   Procedure: RIGHT FOOT 5TH RAY AMPUTATION;  Surgeon: Harden Jerona GAILS, MD;  Location: [facility] OR;  Service: Orthopedics;  Laterality: Right;   AMPUTATION Right 03/11/2022   Procedure: RIGHT LEG DEBRIDEMENT VS. BELOW KNEE AMPUTATION;  Surgeon: Harden Jerona GAILS, MD;  Location: [facility] OR;  Service: Orthopedics;  Laterality: Right;   BIV ICD INSERTION CRT-D N/A 10/21/2022   Procedure: BIV ICD INSERTION CRT-D;  Surgeon: Waddell Danelle ORN, MD;  Location: [facility] INVASIVE CV LAB;  Service: Cardiovascular;  Laterality: N/A;   BOWEL RESECTION N/A 11/14/2023   Procedure: SMALL BOWEL RESECTION;  Surgeon: Stevie, Herlene Righter, MD;  Location: [facility] OR;  Service:  General;  Laterality: N/A;   CARDIAC CATHETERIZATION N/A 09/08/2016   Procedure: Left Heart Cath and Coronary Angiography;  Surgeon: Gordy Bergamo, MD;  Location: [facility] INVASIVE CV LAB;  Service: Cardiovascular;  Laterality: N/A;   CATARACT EXTRACTION, BILATERAL  09/2017, 10/2017   Dr. Cleatus   COLONOSCOPY W/ BIOPSIES AND POLYPECTOMY     CORONARY/GRAFT ACUTE MI REVASCULARIZATION N/A 10/11/2020   Procedure: Coronary/Graft Acute MI Revascularization;  Surgeon: Bergamo Gordy, MD;  Location: [facility] INVASIVE CV LAB;  Service: Cardiovascular;  Laterality: N/A;   I & D EXTREMITY Right 03/11/2022   Procedure: BELOW KNEE AMPUTATION;  Surgeon: Harden Jerona GAILS, MD;  Location: [facility] OR;  Service: Orthopedics;  Laterality: Right;   INSERT / REPLACE / REMOVE PACEMAKER     LAPAROSCOPY N/A 11/14/2023   Procedure: LAPAROSCOPY DIAGNOSTIC;  Surgeon: Stevie, Herlene Righter, MD;  Location: [facility] OR;  Service: General;  Laterality: N/A;  Converted to open ex-lap after inserting first trocar   LAPAROTOMY N/A 11/14/2023   Procedure: EXPLORATORY LAPAROTOMY;  Surgeon: Stevie Herlene Righter, MD;  Location: [facility] OR;  Service: General;  Laterality: N/A;   LEFT HEART CATH N/A 12/31/2020   Procedure: Left Heart Cath;  Surgeon: Bergamo Gordy, MD;  Location: [facility] INVASIVE CV LAB;  Service: Cardiovascular;  Laterality: N/A;   LEFT HEART CATH AND CORONARY ANGIOGRAPHY N/A 10/11/2020   Procedure: LEFT HEART CATH AND CORONARY ANGIOGRAPHY;  Surgeon: Bergamo Gordy, MD;  Location: [facility] INVASIVE CV LAB;  Service: Cardiovascular;  Laterality: N/A;   LOWER EXTREMITY ANGIOGRAM Bilateral 04/18/2012   Procedure: LOWER EXTREMITY ANGIOGRAM;  Surgeon: Lonni GORMAN Blade, MD;  Location: [facility] CATH LAB;  Service: Cardiovascular;  Laterality: Bilateral;  bilat lower extrem angio   LOWER EXTREMITY ANGIOGRAPHY Bilateral 05/02/2019   Procedure: LOWER EXTREMITY ANGIOGRAPHY;  Surgeon: Bergamo Gordy, MD;  Location: [facility] INVASIVE CV LAB;  Service: Cardiovascular;  Laterality: Bilateral;    LOWER EXTREMITY ANGIOGRAPHY Bilateral 02/28/2019   Procedure: LOWER EXTREMITY ANGIOGRAPHY;  Surgeon: Bergamo Gordy, MD;  Location: [facility] INVASIVE CV LAB;  Service: Cardiovascular;  Laterality: Bilateral;   macular photocoagulation     (eye treatments for diabetic retinopathy)-[name]   PERIPHERAL VASCULAR INTERVENTION  02/28/2019   Procedure: PERIPHERAL VASCULAR INTERVENTION;  Surgeon: Bergamo Gordy, MD;  Location: [facility] INVASIVE CV LAB;  Service: Cardiovascular;;   RIGHT HEART CATH N/A 03/29/2024   Procedure: RIGHT HEART CATH;  Surgeon: Zenaida Morene PARAS, MD;  Location: [facility] INVASIVE CV LAB;  Service: Cardiovascular;  Laterality: N/A;   TEE WITHOUT CARDIOVERSION N/A 03/18/2022   Procedure: TRANSESOPHAGEAL ECHOCARDIOGRAM (TEE);  Surgeon: Bergamo Gordy, MD;  Location: [facility] ENDOSCOPY;  Service: Cardiovascular;  Laterality: N/A;   VENTRICULAR ASSIST DEVICE INSERTION N/A 12/31/2020   Procedure: VENTRICULAR ASSIST DEVICE INSERTION;  Surgeon: Bergamo Gordy, MD;  Location: [facility] INVASIVE CV LAB;  Service: Cardiovascular;  Laterality: N/A;   Social History:  reports that he quit smoking about 12  years ago. His smoking use included cigarettes. He started smoking about 42 years ago. He has a 30 pack-year smoking history. He has never used smokeless tobacco. He reports that he does not currently use alcohol. He reports that he does not use drugs.  Allergies  Allergen Reactions   Empagliflozin      Other Reaction(s): foot infection   Shellfish-Derived Products Nausea And Vomiting and Other (See Comments)    Only Mussels cause severe nausea and vomiting    Latex Rash   Tape Rash and Other (See Comments)    Caused issues with the skin   Testosterone  Rash    did not feel well     Family History  Problem Relation Age of Onset   Diabetes Mother    Hearing loss Mother    Hypertension Mother    Hyperlipidemia Mother    Heart disease Mother    Varicose Veins Mother    Varicose Veins Father    Dementia Father     Hyperlipidemia Brother    Diabetes Maternal Grandmother     Prior to Admission medications   Medication Sig Start Date End Date Taking? Authorizing Provider  amiodarone  (PACERONE ) 200 MG tablet Take 1 tablet (200 mg total) by mouth 2 (two) times daily for 7 days, THEN 1 tablet (200 mg total) daily thereafter 04/04/24 06/01/24 Yes Clegg, Amy D, NP  Bioflavonoid Products (ESTER C PO) Take 1,000 mg by mouth daily.   Yes [provider]  Cholecalciferol  (VITAMIN D ) 2000 units CAPS Take 2,000 Units by mouth daily.   Yes [provider]  clopidogrel  (PLAVIX ) 75 MG tablet TAKE 1 TABLET BY MOUTH DAILY 04/21/24  Yes Patwardhan, Manish J, MD  ezetimibe  (ZETIA ) 10 MG tablet TAKE 1 TABLET BY MOUTH DAILY 04/21/24  Yes Patwardhan, Manish J, MD  famotidine  (PEPCID ) 10 MG tablet Take 10 mg by mouth 2 (two) times daily as needed for heartburn or indigestion.   Yes [provider]  gemfibrozil  (LOPID ) 600 MG tablet Take 600 mg by mouth 2 (two) times daily before a meal.   Yes [provider]  HUMALOG KWIKPEN 100 UNIT/ML KiwkPen Inject 3-10 Units into the skin 3 (three) times daily. Inject 01-1009 units into the skin three times a day, per sliding scale- based on BGL >100 08/02/16  Yes [provider]  HUMULIN  N KWIKPEN 100 UNIT/ML KwikPen Inject 6 Units into the skin daily. If BS is under 150 do not take per provider 03/20/24  Yes [provider]  Anselm Oil 300 MG CAPS Take 500 mg by mouth daily.   Yes [provider]  lidocaine  (XYLOCAINE ) 5 % ointment Apply 1 Application topically as needed. For pain as needed- can use up to 4x/day 01/03/24  Yes Lovorn, Megan, MD  melatonin 10 MG TABS Take 10 mg by mouth at bedtime. 03/31/22  Yes Setzer, Sandra J, PA-C  midodrine  (PROAMATINE ) 5 MG tablet Take 3 tablets (15 mg total) by mouth 3 (three) times daily with meals. 04/04/24  Yes Clegg, Amy D, NP  Multiple Vitamin (MULTIVITAMIN WITH MINERALS) TABS tablet Take 1 tablet  by mouth daily.   Yes [provider]  nitroGLYCERIN  (NITROSTAT ) 0.4 MG SL tablet Place 1 tablet (0.4 mg total) under the tongue every 5 (five) minutes as needed for chest pain. 05/08/21  Yes Cantwell, Celeste C, PA-C  potassium chloride  SA (KLOR-CON  M) 20 MEQ tablet Take 2 tablets (40 mEq total) by mouth daily. Patient taking differently: Take 20 mEq by mouth  2 (two) times daily. 04/18/24  Yes Colletta Manuelita Garre, PA-C  Probiotic Product (PROBIOTIC-10 PO) Take 1 capsule by mouth daily.   Yes [provider]  rosuvastatin  (CRESTOR ) 20 MG tablet Take 1 tablet (20 mg total) by mouth daily. 07/22/20  Yes Ladona Heinz, MD  sodium chloride  (OCEAN) 0.65 % SOLN nasal spray Place 1 spray into both nostrils as needed for congestion.   Yes [provider]  tamsulosin  (FLOMAX ) 0.4 MG CAPS capsule Take 1 capsule (0.4 mg total) by mouth daily after supper. 04/04/24  Yes Clegg, Amy D, NP  torsemide  (DEMADEX ) 20 MG tablet Take 3 tablets (60 mg total) by mouth 2 (two) times daily. 04/18/24  Yes Colletta Manuelita Garre, PA-C  amiodarone  (PACERONE ) 200 MG tablet Take 200 mg by mouth daily.    [provider]  Continuous Glucose Sensor (FREESTYLE LIBRE 2 PLUS SENSOR) MISC 1 Application. 03/20/24   [provider]    Physical Exam: Vitals:   05/04/24 0600 05/04/24 0630 05/04/24 0700 05/04/24 0826  BP: (!) 91/58 91/61 102/65   Pulse: 78 78 79   Resp: (!) 21 11 19    Temp:    98.6 F (37 C)  TempSrc:    Oral  SpO2: 92% 94% 96%   Weight:      Height:       Physical Exam Constitutional:      Appearance: He is obese.  HENT:     Head: Normocephalic and atraumatic.     Nose: Nose normal.   Eyes:     Pupils: Pupils are equal, round, and reactive to light.    Cardiovascular:     Rate and Rhythm: Normal rate and regular rhythm.  Pulmonary:     Effort: Pulmonary effort is normal.  Abdominal:     General: Abdomen is flat.   Musculoskeletal:     Comments: S/p R BKA     Skin:    General: Skin is warm.   Neurological:     General: No focal deficit present.   Psychiatric:        Mood and Affect: Mood normal.     Data Reviewed:  There are no new results to review at this time. DG Chest 2 View CLINICAL DATA:  Fever and chills in known insert patient  EXAM: CHEST - 2 VIEW  COMPARISON:  04/03/2024  FINDINGS: Cardiac shadow is enlarged but stable. Defibrillator is again seen and stable. Increasing left effusion with basilar atelectasis is noted. No other focal abnormality is noted.  IMPRESSION: Increasing left effusion and basilar atelectasis.  Electronically Signed   By: Oneil Devonshire M.D.   On: 05/04/2024 01:47  Lab Results  Component Value Date   WBC 14.9 (H) 05/04/2024   HGB 11.9 (L) 05/04/2024   HCT 37.7 (L) 05/04/2024   MCV 88.9 05/04/2024   PLT 162 05/04/2024   Last metabolic panel Lab Results  Component Value Date   GLUCOSE 212 (H) 05/04/2024   NA 138 05/04/2024   K 3.6 05/04/2024   CL 98 05/04/2024   CO2 25 05/04/2024   BUN 45 (H) 05/04/2024   CREATININE 1.71 (H) 05/04/2024   GFRNONAA 43 (L) 05/04/2024   CALCIUM  9.0 05/04/2024   PHOS 2.8 03/13/2022   PROT 5.8 (L) 05/04/2024   ALBUMIN  3.2 (L) 05/04/2024   LABGLOB 2.7 04/08/2022   AGRATIO 1.3 04/08/2022   BILITOT 1.5 (H) 05/04/2024   ALKPHOS 100 05/04/2024   AST 36 05/04/2024   ALT 17 05/04/2024  ANIONGAP 15 05/04/2024    Assessment and Plan: Sepsis (HCC) Meeting sepsis criteria with Tmax 103.3, HR 100s, WBC 12.6  Lactate 3.2-->2.2  SBP 80s-100s  No identifiable source at present though CXR w/ ? L basilar atelectasis  Will check CT chest to better correlate  Started on empiric IV cefepime , flagyl  and vancomycin  for infectious coverage  Panculture  Minimal IVF in setting of low output heart failure and narrow hydration window Low threshold to consult critical care for co-management      Cholangiocarcinoma [facility]) Noted recent diagnosis of  cholangiocarcinoma  Not deemed to be a candidate for advanced therapies/surgical intervention  CT A&P pending in setting of sepsis to rule out overt intra-abdominal vs. Intra-biliary infection  Monitor     HFrEF (heart failure with reduced ejection fraction) (HCC) 2D ECHO 03/2024 w/ EF 20-25%, RV moderately reduced  Noted admission 5/16-5/27 for IV diuresis with heart failure team (please see dc summary for full details)  Appears fairly euvolemic to mildly dry on exam though w/ noted narrow volume window  Weight today 95.1kg  Monitor volume status closely Consult cardiology as clinically indicated.   Essential hypertension, benign SBP 80s-100s in setting of sepsis  Hold BP regimen  Cont midodrine     Stage 3b chronic kidney disease (CKD) (HCC) Baseline Cr 1.5-1.7 w/ GFR in the 40s  Looks to be near baseline today  Monitor w/ treatment    Diabetes mellitus (HCC) Blood sugar in 200s  SSI  A1C        Advance Care Planning:   Code Status: Do not attempt resuscitation (DNR) PRE-ARREST INTERVENTIONS DESIRED   Consults: PCCM   Family Communication: Family at the bedside   Severity of Illness: The appropriate patient status for this patient is OBSERVATION. Observation status is judged to be reasonable and necessary in order to provide the required intensity of service to ensure the patient's safety. The patient's presenting symptoms, physical exam findings, and initial radiographic and laboratory data in the context of their medical condition is felt to place them at decreased risk for further clinical deterioration. Furthermore, it is anticipated that the patient will be medically stable for discharge from the hospital within 2 midnights of admission.   Greater than 50% was spent in counseling and coordination of care with patient Critical care time: 60+ minutes    Author: Elspeth JINNY Masters, MD 05/04/2024 10:44 AM  For on call review [email or web address].

## 2024-05-04 NOTE — Plan of Care (Signed)

## 2024-05-04 NOTE — Progress Notes (Signed)
 Pharmacy Antibiotic Note  Eduardo [name] is a 68 y.o. male admitted on 05/03/2024 with severe sepsis of unclear source.  Pharmacy has been consulted for vancomycin  dosing.  Plan: Vancomycin  1gm IV q24h (AUC 523.4, Scr 1.64) Follow renal function, cultures and clinical course  Height: 5' 9 (175.3 cm) Weight: 95.1 kg (209 lb 11.2 oz) IBW/kg (Calculated) : 70.7  Temp (24hrs), Avg:100.4 F (38 C), Min:98.6 F (37 C), Max:103.3 F (39.6 C)  Recent Labs  Lab 05/03/24 2338 05/04/24 0047 05/04/24 0139  WBC 12.6*  --   --   CREATININE 1.64*  --   --   LATICACIDVEN  --  3.2* 2.3*    Estimated Creatinine Clearance: 49.1 mL/min (A) (by C-G formula based on SCr of 1.64 mg/dL (H)).    Allergies  Allergen Reactions   Empagliflozin      Other Reaction(s): foot infection   Shellfish-Derived Products Nausea And Vomiting and Other (See Comments)    Only Mussels cause severe nausea and vomiting    Latex Rash   Tape Rash and Other (See Comments)    Caused issues with the skin   Testosterone  Rash    did not feel well     Antimicrobials this admission: 6/26 vanc >> 6/26 cefepime  >> 6/26 flagyl  >>  Dose adjustments this admission:   Microbiology results: 6/26 BCx:    Thank you for allowing pharmacy to be a part of this patient's care.  Leeroy Mace RPh 05/04/2024, 4:45 AM

## 2024-05-04 NOTE — Assessment & Plan Note (Signed)
Blood sugar in 200s  SSI  A1C

## 2024-05-04 NOTE — Progress Notes (Signed)
  Carryover admission to the Day Admitter.  I discussed this case with the EDP, Dr. Griselda.  Per these discussions:   This is a 68 year old male with history of cholangiocarcinoma, chronic systolic heart failure with most recent LVEF 20 to 25%, who is being admitted with suspected severe sepsis of unclear source after presenting with subjective fever/chills.  No recent abdominal pain.  He has a history of osteomyelitis, but is now status post right BKA, without any evidence of associated wound infection.  Chest x-ray shows interval increase in the left pleural effusion as well as some basilar atelectasis, but no overt infiltrate to suggest pneumonia.  Urinalysis has been ordered, with result currently pending.  He has a history of chronic borderline hypotension on midodrine  as an outpatient.  Vital signs in the ED today were notable for the following: Temperature max 103.3; heart rates in the 90s to low 100s; systolic blood pressures in the 90s to low 100s; respiratory rate in the 20s.   Additional labs are notable for the following: CBC demonstrated white blood cell count 12,500.  T. bili 1.3.  Otherwise, liver enzymes were within normal limits.  INR 1.2.  BNP 3500.  Initial lactic acid 3.2, with repeat trending down to 2.3.  Blood cultures x 2 were collected followed by initiation of broad-spectrum IV antibiotics that included IV vancomycin , cefepime , Flagyl .  He also received a dose of his home midodrine  15 mg.  I have placed an order for inpatient admission to the stepdown unit for further evaluation and management of suspected severe sepsis of unclear source.  I have placed some additional preliminary admit orders via the adult multi-morbid admission order set. I have also ordered ordered repeat lactic acid level later this morning and added on procalcitonin level.  I have continued broad-spectrum IV antibiotics in the form of IV vancomycin , cefepime , Flagyl  and have continued the patient's home  midodrine .  We have also ordered morning labs in the form of CMP, CBC, magnesium  level.  Of note, I have ordered code status as DNR based upon code stat most recent prior hospitalizationus documentation from May 2025.     Eva Pore, DO Hospitalist

## 2024-05-04 NOTE — ED Notes (Signed)
 Still no urine noted in the urinals RN aware

## 2024-05-04 NOTE — ED Notes (Addendum)
 This RN attempted to gain access x1 unsuccessfully and Pt refused for this RN to try a 2nd attempt. IV Team consult order placed for a 2nd PIV so that the 2nd set of blood cultures could be obtained. IV team came, Pt and family turned IV team away because staff placed 1 PIV by US  not knowing 2 PIV's were needed. Abx not delayed for 2nd PIV access and 2nd set of blood cultures to be obtained. MD made aware.

## 2024-05-04 NOTE — Consult Note (Signed)
 NAME:  Eduardo [name], MRN:  [id number], DOB:  March 06, 1956, LOS: 0 ADMISSION DATE:  05/03/2024, CONSULTATION DATE:  05/04/2024 REFERRING MD:  Dr. Eldonna - [facility], CHIEF COMPLAINT:  Shock    History of Present Illness:  Eduardo [name] is a 68 y.o. make with an extensive PMH significant for but not limited to cholangiocarcinoma, HTN, HLD, HFrEF, extensive CAD, ischemic cardiomyopathy s/p biventricular ICD, CKD stage 2-3, type 2 diabetes, and prior osteomyelitis s/p right BKA who presented to the ED with 1-2 days of malaise, chills, and fever. On ED arrival he meet septic criteria with fever, tachycardia, tachypnea, and hypotension with positive lactic acid and elevated procal. He was admitted per Hospit[facility]. Given borderline low BP PCCM was consulted for assistance in care.   Pertinent  Medical History  Cholangiocarcinoma, HTN, HLD, HFrEF, extensive CAD, ischemic cardiomyopathy s/p biventricular ICD, CKD stage 2-3, type 2 diabetes, and prior osteomyelitis s/p right BKA   Significant Hospital Events: Including procedures, antibiotic start and stop dates in addition to other pertinent events   6/26 admitted with sepsis of unknown origin   Interim History / Subjective:  Seen sitting up in bed with no acute complaints   Objective    Blood pressure 102/65, pulse 79, temperature 98.6 F (37 C), temperature source Oral, resp. rate 19, height 5' 9 (1.753 m), weight 95.1 kg, SpO2 96%.        Intake/Output Summary (Last 24 hours) at 05/04/2024 1020 Last data filed at 05/04/2024 0451 Gross per 24 hour  Intake 690 ml  Output --  Net 690 ml   Filed Weights   05/03/24 2335  Weight: 95.1 kg    Examination: General: Acute on chronic ill appearing middle aged male sitting up in bed in NAD  HEENT: /AT, MM pink/moist, PERRL,  Neuro: Alert and oriented x3, non-focal  CV: s1s2 regular rate and rhythm, no murmur, rubs, or gallops,  PULM:  Clear to auscultation, no increased work of breathing, no added  breath sounds  GI: soft, bowel sounds active in all 4 quadrants, non-tender, non-distended, tolerating oral diet Extremities: warm/dry, no edema  Skin: no rashes or lesions  Resolved problem list   Assessment and Plan  Sepsis of unknown etiology -On ED arrival he meet septic criteria with fever, tachycardia, tachypnea, and hypotension with positive lactic acid and elevated procal -Possible GI source given history of  Cholangiocarcinoma but patient denies any acute abdominal pain   P: Continue cefepime . Flagyl , and vanc  Peripheral pressors for MAP< 65, given patients wishes of less aggressive measure cap pressor use at peripheral for now  Trend lactic acid Trend Procalcitonin Monitor urine output Follow CT and cultures   Cholangiocarcinoma P: Supporitve care  CT ABD to look for source   HFrEF  -ECHO 03/2024 w/ EF 20-25%, RV moderately reduced. Patient declined LVAD option previously and is not a transplant candidate  Extensive CAD Ischemic cardiomyopathy s/p biventricular ICD Essential hypertension  Hyperlipidemia  P: Continuous telemetry  Trend BNP Strict intake and output  Daily weight to assess volume status Daily assessment for need to diurese  Closely monitor renal function and electrolytes  Peripheral pressors as above  GDMT limited by hemodynamics  Continue home midodrine    Acute Kidney Injury CKD stage 3b -Renal function at baseline  P: Follow renal function  Monitor urine output Trend Bmet Avoid nephrotoxins Ensure adequate renal perfusion   Type 2 diabetes  P: SSI  CBG gial 140-180 CBG check ACHS  Goals of Care  Patient recently established with outpatient hospice. He would like to treat what can be treated but would like to focus on less aggressive intervention to do so. He is okay with pressors (recommended to cap this at peripheral dose but he would be okay with a PICC line) but not aggressive intervention such as intubation. Will consult inpatient  Palliative care to assist in managing GOC   Best Practice (right click and Reselect all SmartList Selections daily)   Diet/type: Regular consistency (see orders) DVT prophylaxis SCD Pressure ulcer(s): N/A GI prophylaxis: PPI Lines: N/A Foley:  N/A Code Status:  DNR Last date of multidisciplinary goals of care discussion: Pending, patient recently established with outpatient hospice. He would like to treat what can be treated but would like to focus on less aggressive interventions   Labs   CBC: Recent Labs  Lab 05/03/24 2338 05/04/24 0513  WBC 12.6* 14.9*  NEUTROABS 11.7* 13.6*  HGB 14.0 11.9*  HCT 44.8 37.7*  MCV 90.5 88.9  PLT 222 162    Basic Metabolic Panel: Recent Labs  Lab 05/03/24 2338 05/04/24 0513  NA 139 138  K 4.4 3.6  CL 99 98  CO2 23 25  GLUCOSE 272* 212*  BUN 43* 45*  CREATININE 1.64* 1.71*  CALCIUM  9.4 9.0  MG  --  2.1   GFR: Estimated Creatinine Clearance: 47.1 mL/min (A) (by C-G formula based on SCr of 1.71 mg/dL (H)). Recent Labs  Lab 05/03/24 2338 05/04/24 0047 05/04/24 0139 05/04/24 0513  PROCALCITON  --   --   --  13.47  WBC 12.6*  --   --  14.9*  LATICACIDVEN  --  3.2* 2.3*  --     Liver Function Tests: Recent Labs  Lab 05/03/24 2338 05/04/24 0513  AST 34 36  ALT 16 17  ALKPHOS 118 100  BILITOT 1.3* 1.5*  PROT 6.5 5.8*  ALBUMIN  3.5 3.2*   No results for input(s): LIPASE, AMYLASE in the last 168 hours. No results for input(s): AMMONIA in the last 168 hours.  ABG    Component Value Date/Time   PHART 7.267 (L) 10/11/2020 0918   PCO2ART 54.1 (H) 10/11/2020 0918   PO2ART 105 10/11/2020 0918   HCO3 24.5 03/29/2024 1259   HCO3 24.8 03/29/2024 1259   TCO2 26 03/29/2024 1259   TCO2 26 03/29/2024 1259   ACIDBASEDEF 3.0 (H) 10/11/2020 0918   O2SAT 50.9 04/04/2024 0418     Coagulation Profile: Recent Labs  Lab 05/03/24 2338 05/04/24 0513  INR 1.2 1.3*    Cardiac Enzymes: No results for input(s): CKTOTAL,  CKMB, CKMBINDEX, TROPONINI in the last 168 hours.  HbA1C: Hemoglobin A1C  Date/Time Value Ref Range Status  10/19/2023 12:00 AM 6.6  Final  04/22/2023 12:00 AM 6.9  Final   Hgb A1c MFr Bld  Date/Time Value Ref Range Status  03/24/2024 10:39 PM 6.7 (H) 4.8 - 5.6 % Final    Comment:    (NOTE) Pre diabetes:          5.7%-6.4%  Diabetes:              >6.4%  Glycemic control for   <7.0% adults with diabetes     CBG: No results for input(s): GLUCAP in the last 168 hours.  Review of Systems:   Please see the history of present illness. All other systems reviewed and are negative    Past Medical History:  He,  has a past medical history of CHF (congestive heart failure) (HCC),  Chronic kidney disease, Colon polyp, Coronary artery disease, Diabetes mellitus (1987), Diabetic retinopathy, Dupuytren contracture, Essential hypertension, benign, Essential hypertension, benign (02/06/2019), Frequency of urination and polyuria, Hypertension, Myocardial infarction [facility]), Neuromuscular disorder (HCC), Osteomyelitis (HCC), Other testicular hypofunction, Peripheral arterial disease (HCC) (10/28/2012), Peritoneal abscess (HCC) (6/08), Pneumonia, Polydipsia, Proteinuria, Pure hyperglyceridemia, Subacute osteomyelitis, right ankle and foot (HCC), and Wears glasses.   Surgical History:   Past Surgical History:  Procedure Laterality Date   ABDOMINAL AORTAGRAM N/A 04/18/2012   Procedure: ABDOMINAL EZELLA;  Surgeon: Lonni GORMAN Blade, MD;  Location: [facility] CATH LAB;  Service: Cardiovascular;  Laterality: N/A;   AMPUTATION Right 05/19/2019   Procedure: RIGHT FOOT 5TH RAY AMPUTATION;  Surgeon: Harden Jerona GAILS, MD;  Location[facility] OR;  Service: Orthopedics;  Laterality: Right;   AMPUTATION Right 03/11/2022   Procedure: RIGHT LEG DEBRIDEMENT VS. BELOW KNEE AMPUTATION;  Surgeon: Harden Jerona GAILS, MD;  Location: [facility] OR;  Service: Orthopedics;  Laterality: Right;   BIV ICD INSERTION CRT-D N/A 10/21/2022    Procedure: BIV ICD INSERTION CRT-D;  Surgeon: Waddell Danelle ORN, MD;  Location: [facility] INVASIVE CV LAB;  Service: Cardiovascular;  Laterality: N/A;   BOWEL RESECTION N/A 11/14/2023   Procedure: SMALL BOWEL RESECTION;  Surgeon: Stevie, Herlene Righter, MD;  Location: [facility] OR;  Service: General;  Laterality: N/A;   CARDIAC CATHETERIZATION N/A 09/08/2016   Procedure: Left Heart Cath and Coronary Angiography;  Surgeon: Gordy Bergamo, MD;  Location: [facility] INVASIVE CV LAB;  Service: Cardiovascular;  Laterality: N/A;   CATARACT EXTRACTION, BILATERAL  09/2017, 10/2017   Dr. Cleatus   COLONOSCOPY W/ BIOPSIES AND POLYPECTOMY     CORONARY/GRAFT ACUTE MI REVASCULARIZATION N/A 10/11/2020   Procedure: Coronary/Graft Acute MI Revascularization;  Surgeon: Bergamo Gordy, MD;  Location: [facility] INVASIVE CV LAB;  Service: Cardiovascular;  Laterality: N/A;   I & D EXTREMITY Right 03/11/2022   Procedure: BELOW KNEE AMPUTATION;  Surgeon: Harden Jerona GAILS, MD;  Location: [facility] OR;  Service: Orthopedics;  Laterality: Right;   INSERT / REPLACE / REMOVE PACEMAKER     LAPAROSCOPY N/A 11/14/2023   Procedure: LAPAROSCOPY DIAGNOSTIC;  Surgeon: Stevie, Herlene Righter, MD;  Location: [facility] OR;  Service: General;  Laterality: N/A;  Converted to open ex-lap after inserting first trocar   LAPAROTOMY N/A 11/14/2023   Procedure: EXPLORATORY LAPAROTOMY;  Surgeon: Stevie Herlene Righter, MD;  Location: [facility] OR;  Service: General;  Laterality: N/A;   LEFT HEART CATH N/A 12/31/2020   Procedure: Left Heart Cath;  Surgeon: Bergamo Gordy, MD;  Location: [facility] INVASIVE CV LAB;  Service: Cardiovascular;  Laterality: N/A;   LEFT HEART CATH AND CORONARY ANGIOGRAPHY N/A 10/11/2020   Procedure: LEFT HEART CATH AND CORONARY ANGIOGRAPHY;  Surgeon: Bergamo Gordy, MD;  Location: [facility] INVASIVE CV LAB;  Service: Cardiovascular;  Laterality: N/A;   LOWER EXTREMITY ANGIOGRAM Bilateral 04/18/2012   Procedure: LOWER EXTREMITY ANGIOGRAM;  Surgeon: Lonni GORMAN Blade, MD;  Location: [facility] CATH LAB;   Service: Cardiovascular;  Laterality: Bilateral;  bilat lower extrem angio   LOWER EXTREMITY ANGIOGRAPHY Bilateral 05/02/2019   Procedure: LOWER EXTREMITY ANGIOGRAPHY;  Surgeon: Bergamo Gordy, MD;  Location: [facility] INVASIVE CV LAB;  Service: Cardiovascular;  Laterality: Bilateral;   LOWER EXTREMITY ANGIOGRAPHY Bilateral 02/28/2019   Procedure: LOWER EXTREMITY ANGIOGRAPHY;  Surgeon: Bergamo Gordy, MD;  Location: [facility] INVASIVE CV LAB;  Service: Cardiovascular;  Laterality: Bilateral;   macular photocoagulation     (eye treatments for diabetic retinopathy)-[name]   PERIPHERAL VASCULAR INTERVENTION  02/28/2019   Procedure: PERIPHERAL VASCULAR  INTERVENTION;  Surgeon: Ladona Heinz, MD;  Location: [facility] INVASIVE CV LAB;  Service: Cardiovascular;;   RIGHT HEART CATH N/A 03/29/2024   Procedure: RIGHT HEART CATH;  Surgeon: Zenaida Morene PARAS, MD;  Location: [facility] INVASIVE CV LAB;  Service: Cardiovascular;  Laterality: N/A;   TEE WITHOUT CARDIOVERSION N/A 03/18/2022   Procedure: TRANSESOPHAGEAL ECHOCARDIOGRAM (TEE);  Surgeon: Ladona Heinz, MD;  Location: [facility] ENDOSCOPY;  Service: Cardiovascular;  Laterality: N/A;   VENTRICULAR ASSIST DEVICE INSERTION N/A 12/31/2020   Procedure: VENTRICULAR ASSIST DEVICE INSERTION;  Surgeon: Ladona Heinz, MD;  Location: [facility] INVASIVE CV LAB;  Service: Cardiovascular;  Laterality: N/A;     Social History:   reports that he quit smoking about 12 years ago. His smoking use included cigarettes. He started smoking about 42 years ago. He has a 30 pack-year smoking history. He has never used smokeless tobacco. He reports that he does not currently use alcohol. He reports that he does not use drugs.   Family History:  His family history includes Dementia in his father; Diabetes in his maternal grandmother and mother; Hearing loss in his mother; Heart disease in his mother; Hyperlipidemia in his brother and mother; Hypertension in his mother; Varicose Veins in his father and mother.    Allergies Allergies  Allergen Reactions   Empagliflozin      Other Reaction(s): foot infection   Shellfish-Derived Products Nausea And Vomiting and Other (See Comments)    Only Mussels cause severe nausea and vomiting    Latex Rash   Tape Rash and Other (See Comments)    Caused issues with the skin   Testosterone  Rash    did not feel well      Home Medications  Prior to Admission medications   Medication Sig Start Date End Date Taking? Authorizing Provider  amiodarone  (PACERONE ) 200 MG tablet Take 1 tablet (200 mg total) by mouth 2 (two) times daily for 7 days, THEN 1 tablet (200 mg total) daily thereafter 04/04/24 06/01/24 Yes Clegg, Amy D, NP  Bioflavonoid Products (ESTER C PO) Take 1,000 mg by mouth daily.   Yes [provider]  Cholecalciferol  (VITAMIN D ) 2000 units CAPS Take 2,000 Units by mouth daily.   Yes [provider]  clopidogrel  (PLAVIX ) 75 MG tablet TAKE 1 TABLET BY MOUTH DAILY 04/21/24  Yes Patwardhan, Manish J, MD  ezetimibe  (ZETIA ) 10 MG tablet TAKE 1 TABLET BY MOUTH DAILY 04/21/24  Yes Patwardhan, Manish J, MD  famotidine  (PEPCID ) 10 MG tablet Take 10 mg by mouth 2 (two) times daily as needed for heartburn or indigestion.   Yes [provider]  gemfibrozil  (LOPID ) 600 MG tablet Take 600 mg by mouth 2 (two) times daily before a meal.   Yes [provider]  HUMALOG KWIKPEN 100 UNIT/ML KiwkPen Inject 3-10 Units into the skin 3 (three) times daily. Inject 01-1009 units into the skin three times a day, per sliding scale- based on BGL >100 08/02/16  Yes [provider]  HUMULIN  N KWIKPEN 100 UNIT/ML KwikPen Inject 6 Units into the skin daily. If BS is under 150 do not take per provider 03/20/24  Yes [provider]  Anselm Oil 300 MG CAPS Take 500 mg by mouth daily.   Yes [provider]  lidocaine  (XYLOCAINE ) 5 % ointment Apply 1 Application topically as needed. For pain as needed- can use up to 4x/day 01/03/24  Yes  Lovorn, Megan, MD  melatonin 10 MG TABS Take 10 mg by mouth at bedtime. 03/31/22  Yes Setzer,  Sandra J, PA-C  midodrine  (PROAMATINE ) 5 MG tablet Take 3 tablets (15 mg total) by mouth 3 (three) times daily with meals. 04/04/24  Yes Clegg, Amy D, NP  Multiple Vitamin (MULTIVITAMIN WITH MINERALS) TABS tablet Take 1 tablet by mouth daily.   Yes [provider]  nitroGLYCERIN  (NITROSTAT ) 0.4 MG SL tablet Place 1 tablet (0.4 mg total) under the tongue every 5 (five) minutes as needed for chest pain. 05/08/21  Yes Cantwell, Celeste C, PA-C  potassium chloride  SA (KLOR-CON  M) 20 MEQ tablet Take 2 tablets (40 mEq total) by mouth daily. Patient taking differently: Take 20 mEq by mouth 2 (two) times daily. 04/18/24  Yes Colletta Manuelita Garre, PA-C  Probiotic Product (PROBIOTIC-10 PO) Take 1 capsule by mouth daily.   Yes [provider]  rosuvastatin  (CRESTOR ) 20 MG tablet Take 1 tablet (20 mg total) by mouth daily. 07/22/20  Yes Ladona Heinz, MD  sodium chloride  (OCEAN) 0.65 % SOLN nasal spray Place 1 spray into both nostrils as needed for congestion.   Yes [provider]  tamsulosin  (FLOMAX ) 0.4 MG CAPS capsule Take 1 capsule (0.4 mg total) by mouth daily after supper. 04/04/24  Yes Clegg, Amy D, NP  torsemide  (DEMADEX ) 20 MG tablet Take 3 tablets (60 mg total) by mouth 2 (two) times daily. 04/18/24  Yes Colletta Manuelita Garre, PA-C  amiodarone  (PACERONE ) 200 MG tablet Take 200 mg by mouth daily.    [provider]  Continuous Glucose Sensor (FREESTYLE LIBRE 2 PLUS SENSOR) MISC 1 Application. 03/20/24   [provider]     Critical care time:   CRITICAL CARE Performed by: Temesgen Weightman D. Harris   Total critical care time: 40 minutes  Critical care time was exclusive of separately billable procedures and treating other patients.  Critical care was necessary to treat or prevent imminent or life-threatening deterioration.  Critical care was time spent personally by me on  the following activities: development of treatment plan with patient and/or surrogate as well as nursing, discussions with consultants, evaluation of patient's response to treatment, examination of patient, obtaining history from patient or surrogate, ordering and performing treatments and interventions, ordering and review of laboratory studies, ordering and review of radiographic studies, pulse oximetry and re-evaluation of patient's condition.  Masayuki Sakai D. Harris, NP-C [facility] Personal contact information can be found on Amion  If no contact or response made please call [phone] 05/04/2024, 11:48 AM

## 2024-05-05 ENCOUNTER — Inpatient Hospital Stay (HOSPITAL_COMMUNITY)

## 2024-05-05 ENCOUNTER — Encounter (HOSPITAL_COMMUNITY)

## 2024-05-05 DIAGNOSIS — R6521 Severe sepsis with septic shock: Secondary | ICD-10-CM

## 2024-05-05 DIAGNOSIS — I38 Endocarditis, valve unspecified: Secondary | ICD-10-CM

## 2024-05-05 DIAGNOSIS — I5023 Acute on chronic systolic (congestive) heart failure: Secondary | ICD-10-CM

## 2024-05-05 DIAGNOSIS — R059 Cough, unspecified: Secondary | ICD-10-CM

## 2024-05-05 DIAGNOSIS — R6520 Severe sepsis without septic shock: Secondary | ICD-10-CM | POA: Diagnosis not present

## 2024-05-05 DIAGNOSIS — A419 Sepsis, unspecified organism: Secondary | ICD-10-CM | POA: Diagnosis not present

## 2024-05-05 LAB — GLUCOSE, CAPILLARY
Glucose-Capillary: 159 mg/dL — ABNORMAL HIGH (ref 70–99)
Glucose-Capillary: 242 mg/dL — ABNORMAL HIGH (ref 70–99)
Glucose-Capillary: 280 mg/dL — ABNORMAL HIGH (ref 70–99)
Glucose-Capillary: 204 mg/dL — ABNORMAL HIGH (ref 70–99)

## 2024-05-05 LAB — BLOOD CULTURE ID PANEL (REFLEXED) - BCID2

## 2024-05-05 LAB — ECHOCARDIOGRAM COMPLETE

## 2024-05-05 MED ORDER — GLUCERNA SHAKE PO LIQD
237.0000 mL | Freq: Three times a day (TID) | ORAL | Status: DC
  Administered 2024-05-05 – 2024-05-09 (×8): 237 mL via ORAL
  Filled 2024-05-05 (×14): qty 237

## 2024-05-05 MED ORDER — NITROGLYCERIN 2 % TD OINT
1.0000 [in_us] | TOPICAL_OINTMENT | Freq: Three times a day (TID) | TRANSDERMAL | Status: AC
  Filled 2024-05-05: qty 1

## 2024-05-05 MED ORDER — SODIUM CHLORIDE 0.9 % IV SOLN
3.0000 g | Freq: Four times a day (QID) | INTRAVENOUS | Status: DC
  Administered 2024-05-05 – 2024-05-08 (×13): 3 g via INTRAVENOUS
  Filled 2024-05-05 (×13): qty 8

## 2024-05-05 MED ORDER — PHENTOLAMINE MESYLATE 5 MG IJ SOLR
5.0000 mg | Freq: Once | INTRAMUSCULAR | Status: AC
  Administered 2024-05-05: 5 mg via SUBCUTANEOUS
  Filled 2024-05-05: qty 5

## 2024-05-05 NOTE — Plan of Care (Signed)

## 2024-05-05 NOTE — Consult Note (Signed)
 Consultation Note Date: 05/05/2024   Patient Name: Eduardo [name]  DOB: [birth year]  MRN: [id number]  Age / Sex: 68 y.o., male  PCP: [name], MD Referring Physician: Lue Elsie BROCKS, MD  Reason for Consultation: Establishing goals of care  HPI/Patient Profile: 68 y.o. male admitted on 05/03/2024    Clinical Assessment and Goals of Care: 68 year old gentleman with a life limiting illness of cholangiocarcinoma hypertension dyslipidemia heart failure with reduced ejection fraction extensive coronary artery disease ischemic cardiomyopathy status post biventricular ICD, stage II-III chronic kidney disease diabetes history of prior osteomyelitis status post right BKA Patient admitted to stepdown unit at [facility] under hospital medicine service with critical care colleagues closely following for sepsis of unknown origin Patient placed on antibiotics Infectious disease also consulted Palliative consult for ongoing goals of care discussions Palliative medicine is specialized medical care for people living with serious illness. It focuses on providing relief from the symptoms and stress of a serious illness. The goal is to improve quality of life for both the patient and the family. Goals of care: Broad aims of medical therapy in relation to the patient's values and preferences. Our aim is to provide medical care aimed at enabling patients to achieve the goals that matter most to them, given the circumstances of their particular medical situation and their constraints.   Patient seen undergoing echocardiogram significant other at bedside Discussed with the significant other about the patient's current condition overall plan remains for continuing current mode of care and for treating the treatable.  Continue antibiotics for now.  Monitor hospital course.  Ultimately, towards the end of this  hospitalization, patient would benefit most from hospice level of care and with a focus exclusively on comfort measures.  Palliative care to continue to monitor.   NEXT OF KIN  Stacey McDermott significant other.   SUMMARY OF RECOMMENDATIONS    DNR Continue current mode of care Recommend hospice support in the outpatient setting.   Code Status/Advance Care Planning: DNR   Symptom Management:    Palliative Prophylaxis:  Frequent Pain Assessment  Psycho-social/Spiritual:  Desire for further Chaplaincy support:yes Additional Recommendations: Education on Hospice  Prognosis:  < 6 months  Discharge Planning: Home with Hospice      Primary Diagnoses: Present on Admission:  Severe sepsis (HCC)  Stage 3b chronic kidney disease (CKD) (HCC)  Cholangiocarcinoma (HCC)  Essential hypertension, benign   I have reviewed the medical record, interviewed the patient and family, and examined the patient. The following aspects are pertinent.  Past Medical History:  Diagnosis Date   CHF (congestive heart failure) (HCC)    Chronic kidney disease    stage 3   Colon polyp    Coronary artery disease    Diabetes mellitus 1987   under care of Dr. Tommas.  On insulin  since 96 (off and on)   Diabetic retinopathy    Dupuytren contracture    R hand, s/p injection (Dr. Lorretta)   Essential hypertension, benign    Essential hypertension, benign 02/06/2019  Frequency of urination and polyuria    Hypertension    Myocardial infarction [facility])    denies   Neuromuscular disorder (HCC)    Diabetic neuropathy   Osteomyelitis (HCC)    right foot   Other testicular hypofunction    Peripheral arterial disease (HCC) 10/28/2012   Peritoneal abscess (HCC) 6/08   and buttock.   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 Retired. 1 cat.  Girlfriend and her daughter lives with him. Enjoys fishing.   Updated 06/2021   Social Drivers of Health   Financial Resource Strain: Low Risk  (07/28/2023)   Overall Financial Resource Strain (CARDIA)    Difficulty of Paying Living Expenses: Not hard at all  Food Insecurity: No Food Insecurity (05/05/2024)   Hunger Vital Sign    Worried About Running Out of Food in the Last Year: Never true    Ran Out of Food in the Last Year: Never true  Transportation Needs: No Transportation Needs (05/05/2024)   PRAPARE - Administrator, Civil Service (Medical): No    Lack of Transportation (Non-Medical): No  Physical Activity: Sufficiently Active (07/28/2023)   Exercise Vital Sign    Days of Exercise per Week: 7 days    Minutes of Exercise per Session: 90 min  Stress: No Stress Concern Present (07/28/2023)   [employer] of Occupational Health - Occupational Stress Questionnaire    Feeling of Stress : Not at all  Social Connections: Socially Isolated (05/05/2024)   Social Connection and Isolation Panel    Frequency of Communication with Friends and Family: Never    Frequency of Social  Gatherings with Friends and Family: Never    Attends Religious Services: Never    Database administrator or Organizations: No    Attends Engineer, structural: Never    Marital Status: Living with partner   Family History  Problem Relation Age of Onset   Diabetes Mother    Hearing loss Mother    Hypertension Mother    Hyperlipidemia Mother    Heart disease Mother    Varicose Veins Mother    Varicose Veins Father    Dementia Father    Hyperlipidemia Brother    Diabetes Maternal Grandmother    Scheduled Meds:  Chlorhexidine  Gluconate Cloth  6  each Topical Daily   insulin  aspart  0-15 Units Subcutaneous TID WC   insulin  aspart  0-5 Units Subcutaneous QHS   midodrine   15 mg Oral TID WC   nitroGLYCERIN   1 inch Topical Q8H   ondansetron  (ZOFRAN ) IV  4 mg Intravenous Once   pantoprazole  (PROTONIX ) IV  40 mg Intravenous Q24H   Continuous Infusions:  ampicillin-sulbactam (UNASYN) IV Stopped (05/05/24 1145)   norepinephrine  (LEVOPHED ) Adult infusion 2 mcg/min (05/05/24 1000)   PRN Meds:.acetaminophen  **OR** acetaminophen , alum & mag hydroxide-simeth, calcium  carbonate, melatonin, ondansetron  (ZOFRAN ) IV, mouth rinse, polyethylene glycol, simethicone  Medications Prior to Admission:  Prior to Admission medications   Medication Sig Start Date End Date Taking? Authorizing Provider  amiodarone  (PACERONE ) 200 MG tablet Take 1 tablet (200 mg total) by mouth 2 (two) times daily for 7 days, THEN 1 tablet (200 mg total) daily thereafter 04/04/24 06/01/24 Yes Clegg, Amy D, NP  Bioflavonoid Products (ESTER C PO) Take 1,000 mg by mouth daily.   Yes [provider]  Cholecalciferol  (VITAMIN D ) 2000 units CAPS Take 2,000 Units by mouth daily.   Yes [provider]  clopidogrel  (PLAVIX ) 75 MG tablet TAKE 1 TABLET BY MOUTH DAILY 04/21/24  Yes Patwardhan, Manish J, MD  ezetimibe  (ZETIA ) 10 MG tablet TAKE 1 TABLET BY MOUTH DAILY 04/21/24  Yes Patwardhan, Manish J, MD  famotidine   (PEPCID ) 10 MG tablet Take 10 mg by mouth 2 (two) times daily as needed for heartburn or indigestion.   Yes [provider]  gemfibrozil  (LOPID ) 600 MG tablet Take 600 mg by mouth 2 (two) times daily before a meal.   Yes [provider]  HUMALOG KWIKPEN 100 UNIT/ML KiwkPen Inject 3-10 Units into the skin 3 (three) times daily. Inject 01-1009 units into the skin three times a day, per sliding scale- based on BGL >100 08/02/16  Yes [provider]  HUMULIN  N KWIKPEN 100 UNIT/ML KwikPen Inject 6 Units into the skin daily. If BS is under 150 do not take per provider 03/20/24  Yes [provider]  Anselm Oil 300 MG CAPS Take 500 mg by mouth daily.   Yes [provider]  lidocaine  (XYLOCAINE ) 5 % ointment Apply 1 Application topically as needed. For pain as needed- can use up to 4x/day 01/03/24  Yes Lovorn, Megan, MD  melatonin 10 MG TABS Take 10 mg by mouth at bedtime. 03/31/22  Yes Setzer, Sandra J, PA-C  midodrine  (PROAMATINE ) 5 MG tablet Take 3 tablets (15 mg total) by mouth 3 (three) times daily with meals. 04/04/24  Yes Clegg, Amy D, NP  Multiple Vitamin (MULTIVITAMIN WITH MINERALS) TABS tablet Take 1 tablet by mouth daily.   Yes [provider]  nitroGLYCERIN  (NITROSTAT ) 0.4 MG SL tablet Place 1 tablet (0.4 mg total) under the tongue every 5 (five) minutes as needed for chest pain. 05/08/21  Yes Cantwell, Celeste C, PA-C  potassium chloride  SA (KLOR-CON  M) 20 MEQ tablet Take 2 tablets (40 mEq total) by mouth daily. Patient taking differently: Take 20 mEq by mouth 2 (two) times daily. 04/18/24  Yes Colletta Manuelita Garre, PA-C  Probiotic Product (PROBIOTIC-10 PO) Take 1 capsule by mouth daily.   Yes [provider]  rosuvastatin  (CRESTOR ) 20 MG tablet Take 1 tablet (20 mg total) by mouth daily. 07/22/20  Yes Ladona Heinz, MD  sodium chloride  (OCEAN) 0.65 % SOLN nasal spray Place 1 spray into both nostrils as needed for congestion.   Yes [provider]  tamsulosin  (FLOMAX )  0.4 MG CAPS capsule Take 1 capsule (0.4 mg total) by mouth daily after supper. 04/04/24  Yes Clegg, Amy D, NP  torsemide  (DEMADEX ) 20 MG tablet Take 3 tablets (60 mg total) by mouth 2 (two) times daily. 04/18/24  Yes Colletta Manuelita Garre, PA-C  amiodarone  (PACERONE ) 200 MG tablet Take 200 mg by mouth daily.    [provider]  Continuous Glucose Sensor (FREESTYLE LIBRE 2 PLUS SENSOR) MISC 1 Application. 03/20/24   [provider]   Allergies  Allergen Reactions   Empagliflozin      Other Reaction(s): foot infection   Shellfish-Derived Products Nausea And Vomiting and Other (See Comments)    Only Mussels cause severe nausea and vomiting    Latex Rash   Tape Rash and Other (See Comments)    Caused issues with the skin   Testosterone  Rash    did not feel well    Review of Systems + weakness   Physical Exam Awake alert  Undergoing ECHO L ICD Monitor noted  Vital Signs: BP 99/61   Pulse 86   Temp 97.9 F (36.6 C) (Oral)   Resp (!) 22   Ht 5' 9 (1.753 m)   Wt 93.2 kg   SpO2 97%   BMI 30.34 kg/m  Pain Scale: 0-10   Pain Score: 0-No pain   SpO2: SpO2: 97 % O2 Device:SpO2: 97 % O2 Flow Rate: .O2 Flow Rate (L/min): 2 L/min  IO: Intake/output summary:  Intake/Output Summary (Last 24 hours) at 05/05/2024 1343 Last data filed at 05/05/2024 1112 Gross per 24 hour  Intake 1096.56 ml  Output 275 ml  Net 821.56 ml    LBM: Last BM Date :  (PTA) Baseline Weight: Weight: 95.1 kg Most recent weight: Weight: 93.2 kg     Palliative Assessment/Data:   PPS 50%  Time In: 12 Time Out:  1300 Time Total:  60 Greater than 50%  of this time was spent counseling and coordinating care related to the above assessment and plan.  Signed by: Lonia Serve, MD   Please contact Palliative Medicine Team phone at [phone] for questions and concerns.  For individual provider: See Tracey

## 2024-05-05 NOTE — Consult Note (Addendum)
 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 Channing last seen on 04/20/2024, cardiomyopathy status post biventricular ICD, multivessel CAD, CKD stage II/III, diabetes, heart failure with reduced ejection fraction admitted with: #Group B strep bacteremia source in setting of carcinoma mass(more likely) vs pulmonary source #ICD #Cholangiocarcinoma #Cough - Patient reported malaise for 1 to 2 days.  Presented to the ED with fever and leukocytosis.  Workup including COVID/flu/RVP 20 was negative.  Chest x-ray shows some atelectasis.  Patient started on vancomycin , metronidazole  and cefepime .  Blood cultures returned growing 1/2 group B strep. -CT chest abdomen pelvis showed bilateral pleural effusions with atelectasis, 9.4X 12.6 cm mass in right hepatic lobe appears increased since prior study, small to moderate ascites. -I suspect bacteremia is hematogenous spread from cholangiocarcinoma  mass , although no concern for infection on imaging.  CT was not concerning for pneumonia but will get sputum cultures given bacteremia. PT has had cough x 3 week Recommendations:  -Start unasyn. D/c vanc+ cefepime + metronidazole  -Repeat blood cultures - TTE, will need TEE. - Please engage E as patient has ICD and gram-positive bacteremia. No pain at ICD site -Sputum Cx - Follow-up blood cultures -Relayed plan to primary   Dr. Dea I will be covering this weekend.  New ID service on Monday. Microbiology:    Antibiotics: Vancomycin  6/25-present Metronidazole -6/25-present Cefepime  6/25-present  Cultures: Blood 6/26 2/4 bottles strep aglactiae Urine  Other COVID/flu negative RVP 20 negative  HPI: Eduardo [name] is a 68 y.o. male with past medical history of heart failure reduced ejection fraction, multivessel CAD, cardiomyopathy status post biventricular ICD , hypertension, cholangocarcinoma, PAD, diabetes mellitus with vascular history presented with malaise x 1 to 2 days.  He had some intermittent cough with phlegm production.  He presented with temp of 103, WBC 12.6 K.  COVID and flu negative.  Chest x-ray showed increased left effusion and process.  Blood cultures grew 1/2 group B strep.  ID engaged.   Review of Systems: Review of Systems  All other systems reviewed and are negative.   Past Medical History:  Diagnosis Date   CHF (congestive heart failure) (HCC)    Chronic kidney disease    stage 3   Colon polyp    Coronary artery disease    Diabetes mellitus 1987   under care of Dr. Tommas.  On insulin  since 96 (off and on)   Diabetic retinopathy    Dupuytren contracture    R hand, s/p injection (Dr. Lorretta)   Essential hypertension, benign    Essential hypertension, benign 02/06/2019   Frequency of urination and polyuria    Hypertension    Myocardial infarction [facility])    denies   Neuromuscular disorder (HCC)    Diabetic neuropathy   Osteomyelitis (HCC)    right foot   Other testicular hypofunction    Peripheral arterial disease (HCC) 10/28/2012   Peritoneal abscess (HCC) 6/08   and buttock.   Pneumonia  Polydipsia    Proteinuria    Pure hyperglyceridemia    Subacute osteomyelitis, right ankle and foot (HCC)    Wears glasses     Social History   Tobacco Use   Smoking status: Former    Current packs/day: 0.00    Average packs/day: 1 pack/day for 30.0 years (30.0 ttl pk-yrs)    Types: Cigarettes    Start date: 01/07/1982    Quit date: 01/08/2012    Years since  quitting: 12.3   Smokeless tobacco: Never  Vaping Use   Vaping status: Never Used  Substance Use Topics   Alcohol use: Not Currently    Comment: rare   Drug use: No    Family History  Problem Relation Age of Onset   Diabetes Mother    Hearing loss Mother    Hypertension Mother    Hyperlipidemia Mother    Heart disease Mother    Varicose Veins Mother    Varicose Veins Father    Dementia Father    Hyperlipidemia Brother    Diabetes Maternal Grandmother    Scheduled Meds:  Chlorhexidine  Gluconate Cloth  6 each Topical Daily   insulin  aspart  0-15 Units Subcutaneous TID WC   insulin  aspart  0-5 Units Subcutaneous QHS   midodrine   15 mg Oral TID WC   nitroGLYCERIN   1 inch Topical Q8H   ondansetron  (ZOFRAN ) IV  4 mg Intravenous Once   pantoprazole  (PROTONIX ) IV  40 mg Intravenous Q24H   Continuous Infusions:  sodium chloride  Stopped (05/05/24 0929)   ampicillin-sulbactam (UNASYN) IV Stopped (05/05/24 1145)   norepinephrine  (LEVOPHED ) Adult infusion 2 mcg/min (05/05/24 1000)   PRN Meds:.acetaminophen  **OR** acetaminophen , alum & mag hydroxide-simeth, calcium  carbonate, melatonin, ondansetron  (ZOFRAN ) IV, mouth rinse, polyethylene glycol, simethicone  Allergies  Allergen Reactions   Empagliflozin      Other Reaction(s): foot infection   Shellfish-Derived Products Nausea And Vomiting and Other (See Comments)    Only Mussels cause severe nausea and vomiting    Latex Rash   Tape Rash and Other (See Comments)    Caused issues with the skin   Testosterone  Rash    did not feel well     OBJECTIVE: Blood pressure 99/61, pulse 86, temperature 97.9 F (36.6 C), temperature source Oral, resp. rate (!) 22, height 5' 9 (1.753 m), weight 93.2 kg, SpO2 97%.  Physical Exam Constitutional:      General: He is not in acute distress.    Appearance: He is normal weight. He is not toxic-appearing.  HENT:     Head: Normocephalic and atraumatic.     Right Ear: External ear normal.      Left Ear: External ear normal.     Nose: No congestion or rhinorrhea.     Mouth/Throat:     Mouth: Mucous membranes are moist.     Pharynx: Oropharynx is clear.   Eyes:     Extraocular Movements: Extraocular movements intact.     Conjunctiva/sclera: Conjunctivae normal.     Pupils: Pupils are equal, round, and reactive to light.    Cardiovascular:     Rate and Rhythm: Normal rate and regular rhythm.     Heart sounds: No murmur heard.    No friction rub. No gallop.  Pulmonary:     Effort: Pulmonary effort is normal.     Breath sounds: Normal breath sounds.     Comments: Left sided ICD Abdominal:     General: Abdomen is flat. Bowel sounds are normal.  Palpations: Abdomen is soft.   Musculoskeletal:        General: No swelling. Normal range of motion.     Cervical back: Normal range of motion and neck supple.   Skin:    General: Skin is warm and dry.   Neurological:     General: No focal deficit present.     Mental Status: He is oriented to person, place, and time.   Psychiatric:        Mood and Affect: Mood normal.     Lab Results Lab Results  Component Value Date   WBC 14.9 (H) 05/04/2024   HGB 11.9 (L) 05/04/2024   HCT 37.7 (L) 05/04/2024   MCV 88.9 05/04/2024   PLT 162 05/04/2024    Lab Results  Component Value Date   CREATININE 1.71 (H) 05/04/2024   BUN 45 (H) 05/04/2024   NA 138 05/04/2024   K 3.6 05/04/2024   CL 98 05/04/2024   CO2 25 05/04/2024    Lab Results  Component Value Date   ALT 17 05/04/2024   AST 36 05/04/2024   ALKPHOS 100 05/04/2024   BILITOT 1.5 (H) 05/04/2024       Loney Stank, MD [facility] 05/05/2024, 11:48 AM Evaluation of this patient requires complex antimicrobial therapy evaluation and counseling + isolation needs for disease transmission risk assessment and mitigation

## 2024-05-05 NOTE — Progress Notes (Signed)
 PHARMACY - PHYSICIAN COMMUNICATION CRITICAL VALUE ALERT - BLOOD CULTURE IDENTIFICATION (BCID)  Eduardo [name] is an 68 y.o. male who presented to [facility] on 05/03/2024 with a chief complaint of sepsis  Assessment:  2/4 group B strep  Name of physician (or Provider) Contacted: Ogan  Current antibiotics: vanc, cefepime , flagyl   Changes to prescribed antibiotics recommended:  Patient is on recommended antibiotics - No changes needed  Results for orders placed or performed during the hospital encounter of 05/03/24  Blood Culture ID Panel (Reflexed) (Collected: 05/04/2024 12:30 AM)  Result Value Ref Range   Enterococcus faecalis NOT DETECTED NOT DETECTED   Enterococcus Faecium NOT DETECTED NOT DETECTED   Listeria monocytogenes NOT DETECTED NOT DETECTED   Staphylococcus species NOT DETECTED NOT DETECTED   Staphylococcus aureus (BCID) NOT DETECTED NOT DETECTED   Staphylococcus epidermidis NOT DETECTED NOT DETECTED   Staphylococcus lugdunensis NOT DETECTED NOT DETECTED   Streptococcus species DETECTED (A) NOT DETECTED   Streptococcus agalactiae DETECTED (A) NOT DETECTED   Streptococcus pneumoniae NOT DETECTED NOT DETECTED   Streptococcus pyogenes NOT DETECTED NOT DETECTED   A.calcoaceticus-baumannii NOT DETECTED NOT DETECTED   Bacteroides fragilis NOT DETECTED NOT DETECTED   Enterobacterales NOT DETECTED NOT DETECTED   Enterobacter cloacae complex NOT DETECTED NOT DETECTED   Escherichia coli NOT DETECTED NOT DETECTED   Klebsiella aerogenes NOT DETECTED NOT DETECTED   Klebsiella oxytoca NOT DETECTED NOT DETECTED   Klebsiella pneumoniae NOT DETECTED NOT DETECTED   Proteus species NOT DETECTED NOT DETECTED   Salmonella species NOT DETECTED NOT DETECTED   Serratia marcescens NOT DETECTED NOT DETECTED   Haemophilus influenzae NOT DETECTED NOT DETECTED   Neisseria meningitidis NOT DETECTED NOT DETECTED   Pseudomonas aeruginosa NOT DETECTED NOT DETECTED   Stenotrophomonas maltophilia NOT  DETECTED NOT DETECTED   Candida albicans NOT DETECTED NOT DETECTED   Candida auris NOT DETECTED NOT DETECTED   Candida glabrata NOT DETECTED NOT DETECTED   Candida krusei NOT DETECTED NOT DETECTED   Candida parapsilosis NOT DETECTED NOT DETECTED   Candida tropicalis NOT DETECTED NOT DETECTED   Cryptococcus neoformans/gattii NOT DETECTED NOT DETECTED    Leeroy Mace RPh 05/05/2024, 3:15 AM

## 2024-05-05 NOTE — Progress Notes (Signed)
 Patients IV alarming occluded, Norepinephrine  infusing through IV. IV placed a few hour prior via IV Team with ultrasound. 22ga 2.5cm catheter which terminated in patients Right AC. Patients infiltration grade is a 2 with only minor swelling and not redness, patient does experience pain when palpating site. Pharmacy notified, order received for warm compress along with SQ Regitine injections.

## 2024-05-05 NOTE — Progress Notes (Addendum)
 NAME:  Eduardo [name], MRN:  [id number], DOB:  September 20, 1956, LOS: 1 ADMISSION DATE:  05/03/2024, CONSULTATION DATE:  05/04/2024 REFERRING MD:  Dr. Eldonna - [facility], CHIEF COMPLAINT:  Shock    History of Present Illness:  Eduardo [name] is a 68 y.o. make with an extensive PMH significant for but not limited to cholangiocarcinoma, HTN, HLD, HFrEF, extensive CAD, ischemic cardiomyopathy s/p biventricular ICD, CKD stage 2-3, type 2 diabetes, and prior osteomyelitis s/p right BKA who presented to the ED with 1-2 days of malaise, chills, and fever. On ED arrival he meet septic criteria with fever, tachycardia, tachypnea, and hypotension with positive lactic acid and elevated procal. He was admitted per Hospitilist. Given borderline low BP PCCM was consulted for assistance in care.   Pertinent  Medical History  Cholangiocarcinoma, HTN, HLD, HFrEF, extensive CAD, ischemic cardiomyopathy s/p biventricular ICD, CKD stage 2-3, type 2 diabetes, and prior osteomyelitis s/p right BKA   Significant Hospital Events: Including procedures, antibiotic start and stop dates in addition to other pertinent events   6/26 admitted with sepsis of unknown origin   Interim History / Subjective:  Feeling better. No acute events overnight  Objective    Blood pressure (!) 93/47, pulse 78, temperature 97.7 F (36.5 C), temperature source Oral, resp. rate (!) 21, height 5' 9 (1.753 m), weight 93.2 kg, SpO2 98%.        Intake/Output Summary (Last 24 hours) at 05/05/2024 0803 Last data filed at 05/05/2024 0121 Gross per 24 hour  Intake 1970.93 ml  Output 250 ml  Net 1720.93 ml   Filed Weights   05/03/24 2335 05/04/24 0500 05/05/24 0221  Weight: 95.1 kg 92 kg 93.2 kg    Examination: General: Adult male in NAD HEENT: [place]/AT, PERRL,   Neuro: Alert, oriented, non-focal CV: RRR, no MRG PULM: Diminished bases, unlabored GI: Soft, nontender, nondistended Extremities: Right BKA, no significant edema  Resolved problem list    Assessment and Plan  Septic shock Group B strep bacteremia by BCID -On ED arrival he meet septic criteria with fever, tachycardia, tachypnea, and hypotension with positive lactic acid and elevated procal -CT chest abdomen pelvis negative for infectious focus.  P: Narrow to ampicillin vs Penicillin  G will discuss with attending. ID auto consult, will follow up.  Peripheral pressors too keep MAP > 65. Open to PICC or CVL if necessary.  Monitor urine output Continue to follow cultures.  Cholangiocarcinoma P: Supporitve care  Sees [name]. No formal tx plans as of yet.   Acute on chronic HFrEF  -ECHO 03/2024 w/ EF 20-25%, RV moderately reduced. Patient declined LVAD option previously and is not a transplant candidate. Presented 4kg up from weight at the cardiology office 6/16 Extensive CAD Ischemic cardiomyopathy s/p biventricular ICD Essential hypertension  Hyperlipidemia  P: Strict intake and output  Daily weight to assess volume status Low threshold to initiate diuresis. Bilateral pleural effusions.  Closely monitor renal function and electrolytes  Peripheral pressors as above  GDMT limited by hemodynamics  Continue home midodrine    Acute Kidney Injury CKD stage 3b -Renal function at baseline  P: Follow renal function  Monitor urine output Trend Bmet Avoid nephrotoxins Ensure adequate renal perfusion   Type 2 diabetes  P: SSI  CBG gial 140-180 CBG check ACHS  Goals of Care  See discussion 6/26. DNR. Treat the treatable. OK with pressors and lines if necessary for care of reversible problems.   Best Practice (right click and Reselect all SmartList Selections daily)  Diet/type: Regular consistency (see orders) DVT prophylaxis SCD Pressure ulcer(s): N/A GI prophylaxis: PPI Lines: N/A Foley:  N/A Code Status:  DNR Last date of multidisciplinary goals of care discussion: Pending, patient recently established with outpatient hospice. He would like to  treat what can be treated but would like to focus on less aggressive interventions   Labs   CBC: Recent Labs  Lab 05/03/24 2338 05/04/24 0513  WBC 12.6* 14.9*  NEUTROABS 11.7* 13.6*  HGB 14.0 11.9*  HCT 44.8 37.7*  MCV 90.5 88.9  PLT 222 162    Basic Metabolic Panel: Recent Labs  Lab 05/03/24 2338 05/04/24 0513  NA 139 138  K 4.4 3.6  CL 99 98  CO2 23 25  GLUCOSE 272* 212*  BUN 43* 45*  CREATININE 1.64* 1.71*  CALCIUM  9.4 9.0  MG  --  2.1   GFR: Estimated Creatinine Clearance: 46.6 mL/min (A) (by C-G formula based on SCr of 1.71 mg/dL (H)). Recent Labs  Lab 05/03/24 2338 05/04/24 0047 05/04/24 0139 05/04/24 0513 05/04/24 1045  PROCALCITON  --   --   --  13.47  --   WBC 12.6*  --   --  14.9*  --   LATICACIDVEN  --  3.2* 2.3*  --  1.8    Liver Function Tests: Recent Labs  Lab 05/03/24 2338 05/04/24 0513  AST 34 36  ALT 16 17  ALKPHOS 118 100  BILITOT 1.3* 1.5*  PROT 6.5 5.8*  ALBUMIN  3.5 3.2*   No results for input(s): LIPASE, AMYLASE in the last 168 hours. No results for input(s): AMMONIA in the last 168 hours.  ABG    Component Value Date/Time   PHART 7.267 (L) 10/11/2020 0918   PCO2ART 54.1 (H) 10/11/2020 0918   PO2ART 105 10/11/2020 0918   HCO3 24.5 03/29/2024 1259   HCO3 24.8 03/29/2024 1259   TCO2 26 03/29/2024 1259   TCO2 26 03/29/2024 1259   ACIDBASEDEF 3.0 (H) 10/11/2020 0918   O2SAT 50.9 04/04/2024 0418     Coagulation Profile: Recent Labs  Lab 05/03/24 2338 05/04/24 0513  INR 1.2 1.3*    Cardiac Enzymes: No results for input(s): CKTOTAL, CKMB, CKMBINDEX, TROPONINI in the last 168 hours.  HbA1C: Hemoglobin A1C  Date/Time Value Ref Range Status  10/19/2023 12:00 AM 6.6  Final  04/22/2023 12:00 AM 6.9  Final   Hgb A1c MFr Bld  Date/Time Value Ref Range Status  03/24/2024 10:39 PM 6.7 (H) 4.8 - 5.6 % Final    Comment:    (NOTE) Pre diabetes:          5.7%-6.4%  Diabetes:              >6.4%  Glycemic  control for   <7.0% adults with diabetes     CBG: Recent Labs  Lab 05/04/24 1129 05/04/24 1531 05/04/24 2144 05/05/24 0721  GLUCAP 243* 247* 218* 159*    Review of Systems:   Please see the history of present illness. All other systems reviewed and are negative    Past Medical History:  He,  has a past medical history of CHF (congestive heart failure) (HCC), Chronic kidney disease, Colon polyp, Coronary artery disease, Diabetes mellitus (1987), Diabetic retinopathy, Dupuytren contracture, Essential hypertension, benign, Essential hypertension, benign (02/06/2019), Frequency of urination and polyuria, Hypertension, Myocardial infarction [facility]), Neuromuscular disorder (HCC), Osteomyelitis (HCC), Other testicular hypofunction, Peripheral arterial disease (HCC) (10/28/2012), Peritoneal abscess (HCC) (6/08), Pneumonia, Polydipsia, Proteinuria, Pure hyperglyceridemia, Subacute osteomyelitis, right ankle and foot (HCC),  and Wears glasses.   Surgical History:   Past Surgical History:  Procedure Laterality Date   ABDOMINAL AORTAGRAM N/A 04/18/2012   Procedure: ABDOMINAL EZELLA;  Surgeon: Lonni GORMAN Blade, MD;  Location: [facility] CATH LAB;  Service: Cardiovascular;  Laterality: N/A;   AMPUTATION Right 05/19/2019   Procedure: RIGHT FOOT 5TH RAY AMPUTATION;  Surgeon: Harden Jerona GAILS, MD;  Location: [facility] OR;  Service: Orthopedics;  Laterality: Right;   AMPUTATION Right 03/11/2022   Procedure: RIGHT LEG DEBRIDEMENT VS. BELOW KNEE AMPUTATION;  Surgeon: Harden Jerona GAILS, MD;  Location: [facility] OR;  Service: Orthopedics;  Laterality: Right;   BIV ICD INSERTION CRT-D N/A 10/21/2022   Procedure: BIV ICD INSERTION CRT-D;  Surgeon: Waddell Danelle ORN, MD;  Location: [facility] INVASIVE CV LAB;  Service: Cardiovascular;  Laterality: N/A;   BOWEL RESECTION N/A 11/14/2023   Procedure: SMALL BOWEL RESECTION;  Surgeon: Stevie, Herlene Righter, MD;  Location: [facility] OR;  Service: General;  Laterality: N/A;   CARDIAC CATHETERIZATION  N/A 09/08/2016   Procedure: Left Heart Cath and Coronary Angiography;  Surgeon: Gordy Bergamo, MD;  Location: [facility] INVASIVE CV LAB;  Service: Cardiovascular;  Laterality: N/A;   CATARACT EXTRACTION, BILATERAL  09/2017, 10/2017   Dr. Cleatus   COLONOSCOPY W/ BIOPSIES AND POLYPECTOMY     CORONARY/GRAFT ACUTE MI REVASCULARIZATION N/A 10/11/2020   Procedure: Coronary/Graft Acute MI Revascularization;  Surgeon: Bergamo Gordy, MD;  Location: [facility] INVASIVE CV LAB;  Service: Cardiovascular;  Laterality: N/A;   I & D EXTREMITY Right 03/11/2022   Procedure: BELOW KNEE AMPUTATION;  Surgeon: Harden Jerona GAILS, MD;  Location: [facility] OR;  Service: Orthopedics;  Laterality: Right;   INSERT / REPLACE / REMOVE PACEMAKER     LAPAROSCOPY N/A 11/14/2023   Procedure: LAPAROSCOPY DIAGNOSTIC;  Surgeon: Stevie, Herlene Righter, MD;  Location: [facility] OR;  Service: General;  Laterality: N/A;  Converted to open ex-lap after inserting first trocar   LAPAROTOMY N/A 11/14/2023   Procedure: EXPLORATORY LAPAROTOMY;  Surgeon: Stevie Herlene Righter, MD;  Location: [facility] OR;  Service: General;  Laterality: N/A;   LEFT HEART CATH N/A 12/31/2020   Procedure: Left Heart Cath;  Surgeon: Bergamo Gordy, MD;  Location: [facility] INVASIVE CV LAB;  Service: Cardiovascular;  Laterality: N/A;   LEFT HEART CATH AND CORONARY ANGIOGRAPHY N/A 10/11/2020   Procedure: LEFT HEART CATH AND CORONARY ANGIOGRAPHY;  Surgeon: Bergamo Gordy, MD;  Location: [facility] INVASIVE CV LAB;  Service: Cardiovascular;  Laterality: N/A;   LOWER EXTREMITY ANGIOGRAM Bilateral 04/18/2012   Procedure: LOWER EXTREMITY ANGIOGRAM;  Surgeon: Lonni GORMAN Blade, MD;  Location: [facility] CATH LAB;  Service: Cardiovascular;  Laterality: Bilateral;  bilat lower extrem angio   LOWER EXTREMITY ANGIOGRAPHY Bilateral 05/02/2019   Procedure: LOWER EXTREMITY ANGIOGRAPHY;  Surgeon: Bergamo Gordy, MD;  Location: [facility] INVASIVE CV LAB;  Service: Cardiovascular;  Laterality: Bilateral;   LOWER EXTREMITY ANGIOGRAPHY Bilateral 02/28/2019    Procedure: LOWER EXTREMITY ANGIOGRAPHY;  Surgeon: Bergamo Gordy, MD;  Location: [facility] INVASIVE CV LAB;  Service: Cardiovascular;  Laterality: Bilateral;   macular photocoagulation     (eye treatments for diabetic retinopathy)-[name]   PERIPHERAL VASCULAR INTERVENTION  02/28/2019   Procedure: PERIPHERAL VASCULAR INTERVENTION;  Surgeon: Bergamo Gordy, MD;  Location: [facility] INVASIVE CV LAB;  Service: Cardiovascular;;   RIGHT HEART CATH N/A 03/29/2024   Procedure: RIGHT HEART CATH;  Surgeon: Zenaida Morene PARAS, MD;  Location: [facility] INVASIVE CV LAB;  Service: Cardiovascular;  Laterality: N/A;   TEE WITHOUT CARDIOVERSION N/A 03/18/2022   Procedure: TRANSESOPHAGEAL ECHOCARDIOGRAM (TEE);  Surgeon: Ladona Heinz, MD;  Location: [facility] ENDOSCOPY;  Service: Cardiovascular;  Laterality: N/A;   VENTRICULAR ASSIST DEVICE INSERTION N/A 12/31/2020   Procedure: VENTRICULAR ASSIST DEVICE INSERTION;  Surgeon: Ladona Heinz, MD;  Location: [facility] INVASIVE CV LAB;  Service: Cardiovascular;  Laterality: N/A;     Social History:   reports that he quit smoking about 12 years ago. His smoking use included cigarettes. He started smoking about 42 years ago. He has a 30 pack-year smoking history. He has never used smokeless tobacco. He reports that he does not currently use alcohol. He reports that he does not use drugs.   Family History:  His family history includes Dementia in his father; Diabetes in his maternal grandmother and mother; Hearing loss in his mother; Heart disease in his mother; Hyperlipidemia in his brother and mother; Hypertension in his mother; Varicose Veins in his father and mother.   Allergies Allergies  Allergen Reactions   Empagliflozin      Other Reaction(s): foot infection   Shellfish-Derived Products Nausea And Vomiting and Other (See Comments)    Only Mussels cause severe nausea and vomiting    Latex Rash   Tape Rash and Other (See Comments)    Caused issues with the skin   Testosterone  Rash    did not feel  well      Home Medications  Prior to Admission medications   Medication Sig Start Date End Date Taking? Authorizing Provider  amiodarone  (PACERONE ) 200 MG tablet Take 1 tablet (200 mg total) by mouth 2 (two) times daily for 7 days, THEN 1 tablet (200 mg total) daily thereafter 04/04/24 06/01/24 Yes Clegg, Amy D, NP  Bioflavonoid Products (ESTER C PO) Take 1,000 mg by mouth daily.   Yes [provider]  Cholecalciferol  (VITAMIN D ) 2000 units CAPS Take 2,000 Units by mouth daily.   Yes [provider]  clopidogrel  (PLAVIX ) 75 MG tablet TAKE 1 TABLET BY MOUTH DAILY 04/21/24  Yes Patwardhan, Manish J, MD  ezetimibe  (ZETIA ) 10 MG tablet TAKE 1 TABLET BY MOUTH DAILY 04/21/24  Yes Patwardhan, Manish J, MD  famotidine  (PEPCID ) 10 MG tablet Take 10 mg by mouth 2 (two) times daily as needed for heartburn or indigestion.   Yes [provider]  gemfibrozil  (LOPID ) 600 MG tablet Take 600 mg by mouth 2 (two) times daily before a meal.   Yes [provider]  HUMALOG KWIKPEN 100 UNIT/ML KiwkPen Inject 3-10 Units into the skin 3 (three) times daily. Inject 01-1009 units into the skin three times a day, per sliding scale- based on BGL >100 08/02/16  Yes [provider]  HUMULIN  N KWIKPEN 100 UNIT/ML KwikPen Inject 6 Units into the skin daily. If BS is under 150 do not take per provider 03/20/24  Yes [provider]  Anselm Oil 300 MG CAPS Take 500 mg by mouth daily.   Yes [provider]  lidocaine  (XYLOCAINE ) 5 % ointment Apply 1 Application topically as needed. For pain as needed- can use up to 4x/day 01/03/24  Yes Lovorn, Megan, MD  melatonin 10 MG TABS Take 10 mg by mouth at bedtime. 03/31/22  Yes Setzer, Sandra J, PA-C  midodrine  (PROAMATINE ) 5 MG tablet Take 3 tablets (15 mg total) by mouth 3 (three) times daily with meals. 04/04/24  Yes Clegg, Amy D, NP  Multiple Vitamin (MULTIVITAMIN WITH MINERALS) TABS tablet Take 1 tablet by mouth daily.   Yes [provider]  nitroGLYCERIN  (NITROSTAT ) 0.4 MG SL tablet Place 1 tablet (  0.4 mg total) under the tongue every 5 (five) minutes as needed for chest pain. 05/08/21  Yes Cantwell, Celeste C, PA-C  potassium chloride  SA (KLOR-CON  M) 20 MEQ tablet Take 2 tablets (40 mEq total) by mouth daily. Patient taking differently: Take 20 mEq by mouth 2 (two) times daily. 04/18/24  Yes Colletta Manuelita Garre, PA-C  Probiotic Product (PROBIOTIC-10 PO) Take 1 capsule by mouth daily.   Yes [provider]  rosuvastatin  (CRESTOR ) 20 MG tablet Take 1 tablet (20 mg total) by mouth daily. 07/22/20  Yes Ladona Heinz, MD  sodium chloride  (OCEAN) 0.65 % SOLN nasal spray Place 1 spray into both nostrils as needed for congestion.   Yes [provider]  tamsulosin  (FLOMAX ) 0.4 MG CAPS capsule Take 1 capsule (0.4 mg total) by mouth daily after supper. 04/04/24  Yes Clegg, Amy D, NP  torsemide  (DEMADEX ) 20 MG tablet Take 3 tablets (60 mg total) by mouth 2 (two) times daily. 04/18/24  Yes Colletta Manuelita Garre, PA-C  amiodarone  (PACERONE ) 200 MG tablet Take 200 mg by mouth daily.    [provider]  Continuous Glucose Sensor (FREESTYLE LIBRE 2 PLUS SENSOR) MISC 1 Application. 03/20/24   [provider]     Critical care time: 47 minutes    Deward Eastern, AGACNP-BC [facility]  See Amion for personal pager PCCM on call pager [phone] until 7pm. Please call Elink 7p-7a. [phone]  05/05/2024 8:38 AM

## 2024-05-05 NOTE — Progress Notes (Signed)
 PROGRESS NOTE    Eduardo [name]  FMW:[id number] DOB: 1956/07/12 DOA: 05/03/2024 PCP: [name], MD   Brief Narrative:  Eduardo [name] is a 68 y.o. 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  Assessment & Plan:   Principal Problem:   Severe sepsis (HCC) Active Problems:   Sepsis (HCC)   Diabetes mellitus (HCC)   Stage 3b chronic kidney disease (CKD) (HCC)   Essential hypertension, benign   Cholangiocarcinoma (HCC)   Septic shock  Secondary to group B strep bacteremia Meeting sepsis criteria with Tmax 103.3, HR 100s, WBC 12.6  Lactate 3.2-->2.2 requiring pressors to maintain MAP during acute illness  Appreciate infectious disease insight recommendations -transition to Unasyn given cultures Will need Gram-positive bacteremia workup(TTE/TEE), EP involved as well given patient's ICD implantation Repeat cultures per protocol  History of essential hypertension Hypotension SBP 80s-100s in setting of sepsis  Borderline hypotension over the last month in clinic, diastolic blood pressure ranging 80-100 Hold BP regimen, heart failure core measures limited by hypotension Cont midodrine  15 TID Remains generally asymptomatic despite hypotension, without tachycardia   Cholangiocarcinoma (HCC) Noted recent diagnosis of cholangiocarcinoma  Not deemed to be a candidate for advanced therapies/surgical intervention  ID concerned this may be a source of seeding given highly vascular involvement    HFrEF (heart failure with reduced ejection fraction) (HCC) 2D ECHO 03/2024 w/ EF 20-25%, RV moderately reduced  Recently admitted May 16 through 27 Currently euvolemic/avoid aggressive IV fluids, continue pressors for MAP   Stage 3b chronic kidney disease (CKD) (HCC) Baseline Cr 1.5-1.7 w/ GFR in the 40s  Looks to be  near baseline today  Monitor w/ treatment   Diabetes mellitus (HCC) Blood sugar in 200s  SSI  A1C   DVT prophylaxis: SCDs Start: 05/04/24 0435 Code Status:   Code Status: Do not attempt resuscitation (DNR) PRE-ARREST INTERVENTIONS DESIRED Family Communication: None present  Status is: Inpatient  Dispo: The patient is from: Home              Anticipated d/c is to: To be determined              Anticipated d/c date is: To be determined              Patient currently not medically stable for discharge  Consultants:  ID, EP, PCCM  Procedures:  None  Antimicrobials:  Unasyn  Subjective: No acute issues or events overnight, feels quite well denies nausea vomiting diarrhea constipation high fevers chills chest pain  Objective: Vitals:   05/05/24 0615 05/05/24 0630 05/05/24 0645 05/05/24 0700  BP: 96/60 (!) 95/58  101/66  Pulse: 69 73 73 76  Resp: 19 18 18 20   Temp:      TempSrc:      SpO2: 100% 99% 100% 100%  Weight:      Height:        Intake/Output Summary (Last 24 hours) at 05/05/2024 0714 Last data filed at 05/05/2024 0121 Gross per 24 hour  Intake 1970.93 ml  Output 250 ml  Net 1720.93 ml   Filed Weights   05/03/24 2335 05/04/24 0500 05/05/24 0221  Weight: 95.1 kg 92 kg 93.2 kg    Examination:  General: Pleasantly resting in bed, No acute distress. HEENT: Normocephalic atraumatic.  Sclerae nonicteric, noninjected.  Extraocular movements intact bilaterally. Neck: Without mass or deformity.  Trachea is midline. Lungs: Clear to auscultate bilaterally without rhonchi, wheeze, or rales. Heart: Regular rate and rhythm.  Without murmurs, rubs, or gallops. Abdomen: Soft, nontender, nondistended.  Without guarding or rebound. Extremities: Without cyanosis, clubbing, edema, Right BKA Skin: Warm and dry, no erythema.  Data Reviewed: I have personally reviewed following labs and imaging studies  CBC: Recent Labs  Lab 05/03/24 2338 05/04/24 0513  WBC 12.6*  14.9*  NEUTROABS 11.7* 13.6*  HGB 14.0 11.9*  HCT 44.8 37.7*  MCV 90.5 88.9  PLT 222 162   Basic Metabolic Panel: Recent Labs  Lab 05/03/24 2338 05/04/24 0513  NA 139 138  K 4.4 3.6  CL 99 98  CO2 23 25  GLUCOSE 272* 212*  BUN 43* 45*  CREATININE 1.64* 1.71*  CALCIUM  9.4 9.0  MG  --  2.1   GFR: Estimated Creatinine Clearance: 46.6 mL/min (A) (by C-G formula based on SCr of 1.71 mg/dL (H)). Liver Function Tests: Recent Labs  Lab 05/03/24 2338 05/04/24 0513  AST 34 36  ALT 16 17  ALKPHOS 118 100  BILITOT 1.3* 1.5*  PROT 6.5 5.8*  ALBUMIN  3.5 3.2*   No results for input(s): LIPASE, AMYLASE in the last 168 hours. No results for input(s): AMMONIA in the last 168 hours. Coagulation Profile: Recent Labs  Lab 05/03/24 2338 05/04/24 0513  INR 1.2 1.3*   Cardiac Enzymes: No results for input(s): CKTOTAL, CKMB, CKMBINDEX, TROPONINI in the last 168 hours. BNP (last 3 results) No results for input(s): PROBNP in the last 8760 hours. HbA1C: No results for input(s): HGBA1C in the last 72 hours. CBG: Recent Labs  Lab 05/04/24 1129 05/04/24 1531 05/04/24 2144  GLUCAP 243* 247* 218*   Lipid Profile: No results for input(s): CHOL, HDL, LDLCALC, TRIG, CHOLHDL, LDLDIRECT in the last 72 hours. Thyroid  Function Tests: No results for input(s): TSH, T4TOTAL, FREET4, T3FREE, THYROIDAB in the last 72 hours. Anemia Panel: No results for input(s): VITAMINB12, FOLATE, FERRITIN, TIBC, IRON , RETICCTPCT in the last 72 hours. Sepsis Labs: Recent Labs  Lab 05/04/24 0047 05/04/24 0139 05/04/24 0513 05/04/24 1045  PROCALCITON  --   --  13.47  --   LATICACIDVEN 3.2* 2.3*  --  1.8    Recent Results (from the past 240 hours)  Resp panel by RT-PCR (RSV, Flu A&B, Covid) Anterior Nasal Swab     Status: None   Collection Time: 05/03/24 11:38 PM   Specimen: Anterior Nasal Swab  Result Value Ref Range Status   SARS Coronavirus 2 by  RT PCR NEGATIVE NEGATIVE Final    Comment: (NOTE) SARS-CoV-2 target nucleic acids are NOT DETECTED.  The SARS-CoV-2 RNA is generally detectable in upper respiratory specimens during the acute phase of infection. The lowest concentration of SARS-CoV-2 viral copies this assay can detect is 138 copies/mL. A negative result does not preclude SARS-Cov-2 infection and should not be used as the sole basis for treatment or other patient management decisions. A negative result may occur with  improper specimen collection/handling, submission of specimen other than nasopharyngeal swab, presence of viral mutation(s) within the areas targeted by this assay, and inadequate number of viral copies(<138 copies/mL). A negative result must be combined with clinical observations, patient history, and epidemiological information. The expected result is Negative.  Fact Sheet for Patients:  BloggerCourse.com  Fact Sheet for Healthcare Providers:  SeriousBroker.it  This test is no t yet approved or cleared by the [place]  FDA and  has been authorized for detection and/or diagnosis of  SARS-CoV-2 by FDA under an Emergency Use Authorization (EUA). This EUA will remain  in effect (meaning this test can be used) for the duration of the COVID-19 declaration under Section 564(b)(1) of the Act, 21 U.S.C.section 360bbb-3(b)(1), unless the authorization is terminated  or revoked sooner.       Influenza A by PCR NEGATIVE NEGATIVE Final   Influenza B by PCR NEGATIVE NEGATIVE Final    Comment: (NOTE) The Xpert Xpress SARS-CoV-2/FLU/RSV plus assay is intended as an aid in the diagnosis of influenza from Nasopharyngeal swab specimens and should not be used as a sole basis for treatment. Nasal washings and aspirates are unacceptable for Xpert Xpress SARS-CoV-2/FLU/RSV testing.  Fact Sheet for Patients: BloggerCourse.com  Fact Sheet  for Healthcare Providers: SeriousBroker.it  This test is not yet approved or cleared by the [place]  FDA and has been authorized for detection and/or diagnosis of SARS-CoV-2 by FDA under an Emergency Use Authorization (EUA). This EUA will remain in effect (meaning this test can be used) for the duration of the COVID-19 declaration under Section 564(b)(1) of the Act, 21 U.S.C. section 360bbb-3(b)(1), unless the authorization is terminated or revoked.     Resp Syncytial Virus by PCR NEGATIVE NEGATIVE Final    Comment: (NOTE) Fact Sheet for Patients: BloggerCourse.com  Fact Sheet for Healthcare Providers: SeriousBroker.it  This test is not yet approved or cleared by the [place]  FDA and has been authorized for detection and/or diagnosis of SARS-CoV-2 by FDA under an Emergency Use Authorization (EUA). This EUA will remain in effect (meaning this test can be used) for the duration of the COVID-19 declaration under Section 564(b)(1) of the Act, 21 U.S.C. section 360bbb-3(b)(1), unless the authorization is terminated or revoked.  Performed at [facility], [address], [place], [place] [address]   Culture, blood (Routine x 2)     Status: None (Preliminary result)   Collection Time: 05/04/24 12:30 AM   Specimen: BLOOD  Result Value Ref Range Status   Specimen Description   Final    BLOOD LEFT ANTECUBITAL Performed at [facility], [address], [place], [place] [address]    Special Requests   Final    BOTTLES DRAWN AEROBIC AND ANAEROBIC Blood Culture adequate volume Performed at [facility], [address], [place], [place] [address]    Culture  Setup Time   Final    GRAM POSITIVE COCCI IN BOTH AEROBIC AND ANAEROBIC BOTTLES CRITICAL RESULT CALLED TO, READ BACK BY AND VERIFIED WITH: PHARMD E JACKSON 05/05/2024 @ 0255 BY AB Performed  at [facility], [address] [place], [place] [address]    Culture GRAM POSITIVE COCCI  Final   Report Status PENDING  Incomplete  Blood Culture ID Panel (Reflexed)     Status: Abnormal   Collection Time: 05/04/24 12:30 AM  Result Value Ref Range Status   Enterococcus faecalis NOT DETECTED NOT DETECTED Final   Enterococcus Faecium NOT DETECTED NOT DETECTED Final   Listeria monocytogenes NOT DETECTED NOT DETECTED Final   Staphylococcus species NOT DETECTED NOT DETECTED Final   Staphylococcus aureus (BCID) NOT DETECTED NOT DETECTED Final   Staphylococcus epidermidis NOT DETECTED NOT DETECTED Final   Staphylococcus lugdunensis NOT DETECTED NOT DETECTED Final   Streptococcus species DETECTED (A) NOT DETECTED Final    Comment: CRITICAL RESULT CALLED TO, READ BACK BY AND VERIFIED WITH: PHARMD E JACKSON 05/05/2024 @ 0255 BY AB    Streptococcus agalactiae DETECTED (A) NOT DETECTED Final  Comment: CRITICAL RESULT CALLED TO, READ BACK BY AND VERIFIED WITH: PHARMD E JACKSON 05/05/2024 @ 0255 BY AB    Streptococcus pneumoniae NOT DETECTED NOT DETECTED Final   Streptococcus pyogenes NOT DETECTED NOT DETECTED Final   A.calcoaceticus-baumannii NOT DETECTED NOT DETECTED Final   Bacteroides fragilis NOT DETECTED NOT DETECTED Final   Enterobacterales NOT DETECTED NOT DETECTED Final   Enterobacter cloacae complex NOT DETECTED NOT DETECTED Final   Escherichia coli NOT DETECTED NOT DETECTED Final   Klebsiella aerogenes NOT DETECTED NOT DETECTED Final   Klebsiella oxytoca NOT DETECTED NOT DETECTED Final   Klebsiella pneumoniae NOT DETECTED NOT DETECTED Final   Proteus species NOT DETECTED NOT DETECTED Final   Salmonella species NOT DETECTED NOT DETECTED Final   Serratia marcescens NOT DETECTED NOT DETECTED Final   Haemophilus influenzae NOT DETECTED NOT DETECTED Final   Neisseria meningitidis NOT DETECTED NOT DETECTED Final   Pseudomonas aeruginosa NOT DETECTED NOT DETECTED Final    Stenotrophomonas maltophilia NOT DETECTED NOT DETECTED Final   Candida albicans NOT DETECTED NOT DETECTED Final   Candida auris NOT DETECTED NOT DETECTED Final   Candida glabrata NOT DETECTED NOT DETECTED Final   Candida krusei NOT DETECTED NOT DETECTED Final   Candida parapsilosis NOT DETECTED NOT DETECTED Final   Candida tropicalis NOT DETECTED NOT DETECTED Final   Cryptococcus neoformans/gattii NOT DETECTED NOT DETECTED Final    Comment: Performed at [facility], [address] [place], [place] [address]  MRSA Next Gen by PCR, Nasal     Status: None   Collection Time: 05/04/24  9:53 AM   Specimen: Nasal Mucosa; Nasal Swab  Result Value Ref Range Status   MRSA by PCR Next Gen NOT DETECTED NOT DETECTED Final    Comment: (NOTE) The GeneXpert MRSA Assay (FDA approved for NASAL specimens only), is one component of a comprehensive MRSA colonization surveillance program. It is not intended to diagnose MRSA infection nor to guide or monitor treatment for MRSA infections. Test performance is not FDA approved in patients less than 54 years old. Performed at [facility], [address], [place], [place] [address]   Culture, blood (Routine x 2)     Status: None (Preliminary result)   Collection Time: 05/04/24 10:45 AM   Specimen: BLOOD LEFT HAND  Result Value Ref Range Status   Specimen Description   Final    BLOOD LEFT HAND Performed at [facility], [address] [place], [place] [address]    Special Requests   Final    BOTTLES DRAWN AEROBIC AND ANAEROBIC Blood Culture results may not be optimal due to an inadequate volume of blood received in culture bottles Performed at [facility], [address] [place], [place] [address]    Culture PENDING  Incomplete   Report Status PENDING  Incomplete  Respiratory (~20 pathogens) panel by PCR     Status: None   Collection Time: 05/04/24  3:09 PM   Specimen: Nasopharyngeal Swab;  Respiratory  Result Value Ref Range Status   Adenovirus NOT DETECTED NOT DETECTED Final   Coronavirus 229E NOT DETECTED NOT DETECTED Final    Comment: (NOTE) The Coronavirus on the Respiratory Panel, DOES NOT test for the novel  Coronavirus (2019 nCoV)    Coronavirus HKU1 NOT DETECTED NOT DETECTED Final   Coronavirus NL63 NOT DETECTED NOT DETECTED Final   Coronavirus OC43 NOT DETECTED NOT DETECTED Final   Metapneumovirus NOT DETECTED NOT DETECTED Final   Rhinovirus /  Enterovirus NOT DETECTED NOT DETECTED Final   Influenza A NOT DETECTED NOT DETECTED Final   Influenza B NOT DETECTED NOT DETECTED Final   Parainfluenza Virus 1 NOT DETECTED NOT DETECTED Final   Parainfluenza Virus 2 NOT DETECTED NOT DETECTED Final   Parainfluenza Virus 3 NOT DETECTED NOT DETECTED Final   Parainfluenza Virus 4 NOT DETECTED NOT DETECTED Final   Respiratory Syncytial Virus NOT DETECTED NOT DETECTED Final   Bordetella pertussis NOT DETECTED NOT DETECTED Final   Bordetella Parapertussis NOT DETECTED NOT DETECTED Final   Chlamydophila pneumoniae NOT DETECTED NOT DETECTED Final   Mycoplasma pneumoniae NOT DETECTED NOT DETECTED Final    Comment: Performed at [facility], [address], [place], [place] [address]         Radiology Studies: CT CHEST ABDOMEN PELVIS WO CONTRAST Result Date: 05/04/2024 CLINICAL DATA:  Sepsis. EXAM: CT CHEST, ABDOMEN AND PELVIS WITHOUT CONTRAST TECHNIQUE: Multidetector CT imaging of the chest, abdomen and pelvis was performed following the standard protocol without IV contrast. RADIATION DOSE REDUCTION: This exam was performed according to the departmental dose-optimization program which includes automated exposure control, adjustment of the mA and/or kV according to patient size and/or use of iterative reconstruction technique. COMPARISON:  CT scan chest from 03/09/2024 and CT scan abdomen and pelvis from 02/14/2024. FINDINGS: CT CHEST FINDINGS Cardiovascular: Normal cardiac  size. No pericardial effusion. No aortic aneurysm. There are coronary artery calcifications, in keeping with coronary artery disease. There are also mild peripheral atherosclerotic vascular calcifications of thoracic aorta and its major branches. Mediastinum/Nodes: Visualized thyroid  gland appears grossly unremarkable. No solid / cystic mediastinal masses. The esophagus is nondistended precluding optimal assessment. There are few mildly enlarged mediastinal lymph nodes, which appear grossly similar to the prior study. No axillary lymphadenopathy by size criteria. Evaluation of bilateral hila is limited due to lack on intravenous contrast: however, no large hilar lymphadenopathy identified. Lungs/Pleura: The central tracheo-bronchial tree is patent. There is mild, smooth, circumferential thickening of the segmental and subsegmental bronchial walls, throughout bilateral lungs, which is nonspecific. Findings are most commonly seen with bronchitis or reactive airway disease, such as asthma. There is moderate to large left and small-to-moderate right pleural effusion, stable on the left and increased on the right side, since the prior study from 03/09/2024. there are associated compressive atelectatic changes. There are patchy areas of linear, plate-like atelectasis and/or scarring throughout bilateral lungs. No pneumothorax. No suspicious lung nodule. Musculoskeletal: The visualized soft tissues of the chest wall are grossly unremarkable. Note is made of left-sided triple lead cardiac pacemaker. No suspicious osseous lesions. There are mild multilevel degenerative changes in the visualized spine. CT ABDOMEN PELVIS FINDINGS Hepatobiliary: The liver is normal in size. Non-cirrhotic configuration. Redemonstration of ill-defined, heterogeneous hypoattenuating approximately 9.4 x 12.6 cm mass in the right hepatic lobe, which appears increased in size since the prior study. No intrahepatic or extrahepatic bile duct dilation.  The gallbladder is physiologically distended. Small volume layering gallstones/sludge noted without imaging signs of acute cholecystitis. No abnormal wall thickening. No pericholecystic fat stranding. Pancreas: Unremarkable. No pancreatic ductal dilatation or surrounding inflammatory changes. Spleen: Within normal limits. No focal lesion. Adrenals/Urinary Tract: Adrenal glands are unremarkable. No suspicious renal mass within the limitations of this unenhanced exam. No nephroureterolithiasis or obstructive uropathy on either side. There is a partially exophytic cyst arising from the right kidney lower pole measuring approximately 1.4 x 2.0 cm. Redemonstration of mild asymmetric thickening of the anterior bladder wall without associated perivesical fat stranding.  Findings are similar to the prior study and favored to represent sequela of chronic cystitis. No focal urinary bladder mass or bladder calculi. Stomach/Bowel: No disproportionate dilation of the small or large bowel loops. No evidence of abnormal bowel wall thickening or inflammatory changes. The appendix is unremarkable. Vascular/Lymphatic: There is small-to-moderate ascites, new since the prior study. No pneumoperitoneum. No abdominal or pelvic lymphadenopathy, by size criteria. No aneurysmal dilation of the major abdominal arteries. There are marked peripheral atherosclerotic vascular calcifications of the aorta and its major branches. Reproductive: Mildly enlarged prostate gland. Symmetric bilateral seminal vesicles. There is calcification of bilateral vas deferens, nonspecific but commonly seen in diabetic patients. Other: Inferior abdominal midline surgical scar noted. There are bilateral tiny fat containing inguinal hernias. There is mild-to-moderate anasarca. Musculoskeletal: No suspicious osseous lesions. There are mild - moderate multilevel degenerative changes in the visualized spine. IMPRESSION: 1. There is moderate to large left and  small-to-moderate right pleural effusion, stable on the left and increased on the right side, since the prior study. There are associated compressive atelectatic changes. No lung mass, consolidation or pneumothorax. 2. Redemonstration of ill-defined, heterogeneous hypoattenuating approximately 9.4 x 12.6 cm mass in the right hepatic lobe, which appears increased in size since the prior study. 3. There is small-to-moderate ascites, new since the prior study. 4. Multiple other nonacute observations, as described above. Electronically Signed   By: Ree Molt M.D.   On: 05/04/2024 16:13   DG Chest 2 View Result Date: 05/04/2024 CLINICAL DATA:  Fever and chills in known insert patient EXAM: CHEST - 2 VIEW COMPARISON:  04/03/2024 FINDINGS: Cardiac shadow is enlarged but stable. Defibrillator is again seen and stable. Increasing left effusion with basilar atelectasis is noted. No other focal abnormality is noted. IMPRESSION: Increasing left effusion and basilar atelectasis. Electronically Signed   By: Oneil Devonshire M.D.   On: 05/04/2024 01:47   Scheduled Meds:  Chlorhexidine  Gluconate Cloth  6 each Topical Daily   insulin  aspart  0-15 Units Subcutaneous TID WC   insulin  aspart  0-5 Units Subcutaneous QHS   midodrine   15 mg Oral TID WC   ondansetron  (ZOFRAN ) IV  4 mg Intravenous Once   pantoprazole  (PROTONIX ) IV  40 mg Intravenous Q24H   Continuous Infusions:  sodium chloride  10 mL/hr at 05/05/24 0121   ceFEPime  (MAXIPIME ) IV Stopped (05/04/24 2350)   metronidazole  Stopped (05/04/24 2120)   norepinephrine  (LEVOPHED ) Adult infusion 2 mcg/min (05/05/24 0121)   vancomycin  Stopped (05/04/24 1120)     LOS: 1 day   Time spent:  Eduardo JAYSON Montclair, DO [facility]  If 7PM-7AM, please contact night-coverage [email or web address]  05/05/2024, 7:14 AM

## 2024-05-06 DIAGNOSIS — A419 Sepsis, unspecified organism: Secondary | ICD-10-CM | POA: Diagnosis not present

## 2024-05-06 DIAGNOSIS — R6520 Severe sepsis without septic shock: Secondary | ICD-10-CM | POA: Diagnosis not present

## 2024-05-06 LAB — GLUCOSE, CAPILLARY
Glucose-Capillary: 151 mg/dL — ABNORMAL HIGH (ref 70–99)
Glucose-Capillary: 215 mg/dL — ABNORMAL HIGH (ref 70–99)
Glucose-Capillary: 171 mg/dL — ABNORMAL HIGH (ref 70–99)
Glucose-Capillary: 179 mg/dL — ABNORMAL HIGH (ref 70–99)
Glucose-Capillary: 183 mg/dL — ABNORMAL HIGH (ref 70–99)

## 2024-05-06 MED ORDER — ENOXAPARIN SODIUM 40 MG/0.4ML IJ SOSY
40.0000 mg | PREFILLED_SYRINGE | INTRAMUSCULAR | Status: DC
  Administered 2024-05-06 – 2024-05-09 (×4): 40 mg via SUBCUTANEOUS
  Filled 2024-05-06 (×4): qty 0.4

## 2024-05-06 NOTE — Progress Notes (Signed)
 PROGRESS NOTE    Eduardo [name]  FMW:[id number] DOB: 09-Mar-1956 DOA: 05/03/2024 PCP: [name], MD   Brief Narrative:  Eduardo [name] is a 68 y.o. 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  Assessment & Plan:   Principal Problem:   Severe sepsis (HCC) Active Problems:   Sepsis (HCC)   Diabetes mellitus (HCC)   Stage 3b chronic kidney disease (CKD) (HCC)   Essential hypertension, benign   Cholangiocarcinoma (HCC)   Septic shock  Secondary to group B strep bacteremia Meeting sepsis criteria with Tmax 103.3, HR 100s, WBC 12.6  Lactate 3.2-->2.2 requiring pressors to maintain MAP during acute illness  Appreciate infectious disease insight recommendations -transition to Unasyn given cultures Will need Gram-positive bacteremia workup(TTE/TEE) EP involved as well given patient's ICD implantation Repeat cultures per protocol  Hypotension Prior history of essential hypertension SBP 80s-100s in setting of sepsis  Borderline hypotension over the last month in clinic, diastolic blood pressure ranging 80-100 Hold BP regimen, heart failure core measures limited by hypotension Cont midodrine  15 TID Remains generally asymptomatic despite hypotension, without tachycardia   Cholangiocarcinoma (HCC) Noted recent diagnosis of cholangiocarcinoma  Not deemed to be a candidate for advanced therapies/surgical intervention  ID concerned this may be a source of seeding given highly vascular involvement    HFrEF (heart failure with reduced ejection fraction) (HCC) 2D ECHO 03/2024 w/ EF 20-25%, RV moderately reduced  Recently admitted May 16 through 27 Currently euvolemic/avoid aggressive IV fluids, continue pressors for MAP   Stage 3b chronic kidney disease (CKD) (HCC) Baseline Cr 1.5-1.7 w/ GFR in the 40s  Looks  to be near baseline today  Monitor w/ treatment   Diabetes mellitus (HCC) Blood sugar in 200s  SSI  A1C   DVT prophylaxis: SCDs Start: 05/04/24 0435 Code Status:   Code Status: Do not attempt resuscitation (DNR) PRE-ARREST INTERVENTIONS DESIRED Family Communication: None present  Status is: Inpatient  Dispo: The patient is from: Home              Anticipated d/c is to: To be determined              Anticipated d/c date is: To be determined              Patient currently not medically stable for discharge  Consultants:  ID, EP, PCCM  Procedures:  None  Antimicrobials:  Unasyn  Subjective: No acute issues or events overnight, feels quite well denies nausea vomiting diarrhea constipation high fevers chills chest pain  Objective: Vitals:   05/06/24 0400 05/06/24 0439 05/06/24 0500 05/06/24 0600  BP: (!) 93/56  (!) 88/56 94/61  Pulse: 71  73 74  Resp: 17  19 (!) 21  Temp:  97.9 F (36.6 C)    TempSrc:  Oral    SpO2: 98%  99% 98%  Weight:  95 kg    Height:        Intake/Output Summary (Last 24 hours) at 05/06/2024 0714 Last data filed at 05/05/2024 1642 Gross per 24 hour  Intake 315.27 ml  Output 275 ml  Net 40.27 ml   Filed Weights   05/04/24 0500 05/05/24 0221 05/06/24 0439  Weight: 92 kg 93.2 kg 95 kg    Examination:  General: Pleasantly resting in bed, No acute distress. HEENT: Normocephalic atraumatic.  Sclerae nonicteric, noninjected.  Extraocular movements  intact bilaterally. Neck: Without mass or deformity.  Trachea is midline. Lungs: Clear to auscultate bilaterally without rhonchi, wheeze, or rales. Heart: Regular rate and rhythm.  Without murmurs, rubs, or gallops. Abdomen: Soft, nontender, nondistended.  Without guarding or rebound. Extremities: Without cyanosis, clubbing, edema, Right BKA Skin: Warm and dry, no erythema.  Data Reviewed: I have personally reviewed following labs and imaging studies  CBC: Recent Labs  Lab 05/03/24 2338  05/04/24 0513  WBC 12.6* 14.9*  NEUTROABS 11.7* 13.6*  HGB 14.0 11.9*  HCT 44.8 37.7*  MCV 90.5 88.9  PLT 222 162   Basic Metabolic Panel: Recent Labs  Lab 05/03/24 2338 05/04/24 0513  NA 139 138  K 4.4 3.6  CL 99 98  CO2 23 25  GLUCOSE 272* 212*  BUN 43* 45*  CREATININE 1.64* 1.71*  CALCIUM  9.4 9.0  MG  --  2.1   GFR: Estimated Creatinine Clearance: 47 mL/min (A) (by C-G formula based on SCr of 1.71 mg/dL (H)). Liver Function Tests: Recent Labs  Lab 05/03/24 2338 05/04/24 0513  AST 34 36  ALT 16 17  ALKPHOS 118 100  BILITOT 1.3* 1.5*  PROT 6.5 5.8*  ALBUMIN  3.5 3.2*   No results for input(s): LIPASE, AMYLASE in the last 168 hours. No results for input(s): AMMONIA in the last 168 hours. Coagulation Profile: Recent Labs  Lab 05/03/24 2338 05/04/24 0513  INR 1.2 1.3*   Cardiac Enzymes: No results for input(s): CKTOTAL, CKMB, CKMBINDEX, TROPONINI in the last 168 hours. BNP (last 3 results) No results for input(s): PROBNP in the last 8760 hours. HbA1C: No results for input(s): HGBA1C in the last 72 hours. CBG: Recent Labs  Lab 05/04/24 2144 05/05/24 0721 05/05/24 1111 05/05/24 1825 05/05/24 2113  GLUCAP 218* 159* 242* 280* 204*   Lipid Profile: No results for input(s): CHOL, HDL, LDLCALC, TRIG, CHOLHDL, LDLDIRECT in the last 72 hours. Thyroid  Function Tests: No results for input(s): TSH, T4TOTAL, FREET4, T3FREE, THYROIDAB in the last 72 hours. Anemia Panel: No results for input(s): VITAMINB12, FOLATE, FERRITIN, TIBC, IRON , RETICCTPCT in the last 72 hours. Sepsis Labs: Recent Labs  Lab 05/04/24 0047 05/04/24 0139 05/04/24 0513 05/04/24 1045  PROCALCITON  --   --  13.47  --   LATICACIDVEN 3.2* 2.3*  --  1.8    Recent Results (from the past 240 hours)  Resp panel by RT-PCR (RSV, Flu A&B, Covid) Anterior Nasal Swab     Status: None   Collection Time: 05/03/24 11:38 PM   Specimen: Anterior  Nasal Swab  Result Value Ref Range Status   SARS Coronavirus 2 by RT PCR NEGATIVE NEGATIVE Final    Comment: (NOTE) SARS-CoV-2 target nucleic acids are NOT DETECTED.  The SARS-CoV-2 RNA is generally detectable in upper respiratory specimens during the acute phase of infection. The lowest concentration of SARS-CoV-2 viral copies this assay can detect is 138 copies/mL. A negative result does not preclude SARS-Cov-2 infection and should not be used as the sole basis for treatment or other patient management decisions. A negative result may occur with  improper specimen collection/handling, submission of specimen other than nasopharyngeal swab, presence of viral mutation(s) within the areas targeted by this assay, and inadequate number of viral copies(<138 copies/mL). A negative result must be combined with clinical observations, patient history, and epidemiological information. The expected result is Negative.  Fact Sheet for Patients:  BloggerCourse.com  Fact Sheet for Healthcare Providers:  SeriousBroker.it  This test is no t yet approved or cleared by  the [place]  FDA and  has been authorized for detection and/or diagnosis of SARS-CoV-2 by FDA under an Emergency Use Authorization (EUA). This EUA will remain  in effect (meaning this test can be used) for the duration of the COVID-19 declaration under Section 564(b)(1) of the Act, 21 U.S.C.section 360bbb-3(b)(1), unless the authorization is terminated  or revoked sooner.       Influenza A by PCR NEGATIVE NEGATIVE Final   Influenza B by PCR NEGATIVE NEGATIVE Final    Comment: (NOTE) The Xpert Xpress SARS-CoV-2/FLU/RSV plus assay is intended as an aid in the diagnosis of influenza from Nasopharyngeal swab specimens and should not be used as a sole basis for treatment. Nasal washings and aspirates are unacceptable for Xpert Xpress SARS-CoV-2/FLU/RSV testing.  Fact Sheet for  Patients: BloggerCourse.com  Fact Sheet for Healthcare Providers: SeriousBroker.it  This test is not yet approved or cleared by the [place]  FDA and has been authorized for detection and/or diagnosis of SARS-CoV-2 by FDA under an Emergency Use Authorization (EUA). This EUA will remain in effect (meaning this test can be used) for the duration of the COVID-19 declaration under Section 564(b)(1) of the Act, 21 U.S.C. section 360bbb-3(b)(1), unless the authorization is terminated or revoked.     Resp Syncytial Virus by PCR NEGATIVE NEGATIVE Final    Comment: (NOTE) Fact Sheet for Patients: BloggerCourse.com  Fact Sheet for Healthcare Providers: SeriousBroker.it  This test is not yet approved or cleared by the [place]  FDA and has been authorized for detection and/or diagnosis of SARS-CoV-2 by FDA under an Emergency Use Authorization (EUA). This EUA will remain in effect (meaning this test can be used) for the duration of the COVID-19 declaration under Section 564(b)(1) of the Act, 21 U.S.C. section 360bbb-3(b)(1), unless the authorization is terminated or revoked.  Performed at [facility], [address], [place], [place] [address]   Culture, blood (Routine x 2)     Status: None (Preliminary result)   Collection Time: 05/04/24 12:30 AM   Specimen: BLOOD  Result Value Ref Range Status   Specimen Description   Final    BLOOD LEFT ANTECUBITAL Performed at [facility], [address] [place], [place] [address]    Special Requests   Final    BOTTLES DRAWN AEROBIC AND ANAEROBIC Blood Culture adequate volume Performed at [facility], [address] [place], [place] [address]    Culture  Setup Time   Final    GRAM POSITIVE COCCI IN BOTH AEROBIC AND ANAEROBIC BOTTLES CRITICAL RESULT CALLED TO, READ BACK BY AND  VERIFIED WITH: PHARMD E JACKSON 05/05/2024 @ 0255 BY AB Performed at [facility], [address], [place], [place] [address]    Culture GRAM POSITIVE COCCI  Final   Report Status PENDING  Incomplete  Blood Culture ID Panel (Reflexed)     Status: Abnormal   Collection Time: 05/04/24 12:30 AM  Result Value Ref Range Status   Enterococcus faecalis NOT DETECTED NOT DETECTED Final   Enterococcus Faecium NOT DETECTED NOT DETECTED Final   Listeria monocytogenes NOT DETECTED NOT DETECTED Final   Staphylococcus species NOT DETECTED NOT DETECTED Final   Staphylococcus aureus (BCID) NOT DETECTED NOT DETECTED Final   Staphylococcus epidermidis NOT DETECTED NOT DETECTED Final   Staphylococcus lugdunensis NOT DETECTED NOT DETECTED Final   Streptococcus species DETECTED (A) NOT DETECTED Final    Comment: CRITICAL RESULT CALLED TO, READ BACK BY AND VERIFIED WITH: PHARMD E JACKSON 05/05/2024 @  0255 BY AB    Streptococcus agalactiae DETECTED (A) NOT DETECTED Final    Comment: CRITICAL RESULT CALLED TO, READ BACK BY AND VERIFIED WITH: PHARMD E JACKSON 05/05/2024 @ 0255 BY AB    Streptococcus pneumoniae NOT DETECTED NOT DETECTED Final   Streptococcus pyogenes NOT DETECTED NOT DETECTED Final   A.calcoaceticus-baumannii NOT DETECTED NOT DETECTED Final   Bacteroides fragilis NOT DETECTED NOT DETECTED Final   Enterobacterales NOT DETECTED NOT DETECTED Final   Enterobacter cloacae complex NOT DETECTED NOT DETECTED Final   Escherichia coli NOT DETECTED NOT DETECTED Final   Klebsiella aerogenes NOT DETECTED NOT DETECTED Final   Klebsiella oxytoca NOT DETECTED NOT DETECTED Final   Klebsiella pneumoniae NOT DETECTED NOT DETECTED Final   Proteus species NOT DETECTED NOT DETECTED Final   Salmonella species NOT DETECTED NOT DETECTED Final   Serratia marcescens NOT DETECTED NOT DETECTED Final   Haemophilus influenzae NOT DETECTED NOT DETECTED Final   Neisseria meningitidis NOT DETECTED NOT DETECTED Final    Pseudomonas aeruginosa NOT DETECTED NOT DETECTED Final   Stenotrophomonas maltophilia NOT DETECTED NOT DETECTED Final   Candida albicans NOT DETECTED NOT DETECTED Final   Candida auris NOT DETECTED NOT DETECTED Final   Candida glabrata NOT DETECTED NOT DETECTED Final   Candida krusei NOT DETECTED NOT DETECTED Final   Candida parapsilosis NOT DETECTED NOT DETECTED Final   Candida tropicalis NOT DETECTED NOT DETECTED Final   Cryptococcus neoformans/gattii NOT DETECTED NOT DETECTED Final    Comment: Performed at [facility], [address], [place], [place] [address]  MRSA Next Gen by PCR, Nasal     Status: None   Collection Time: 05/04/24  9:53 AM   Specimen: Nasal Mucosa; Nasal Swab  Result Value Ref Range Status   MRSA by PCR Next Gen NOT DETECTED NOT DETECTED Final    Comment: (NOTE) The GeneXpert MRSA Assay (FDA approved for NASAL specimens only), is one component of a comprehensive MRSA colonization surveillance program. It is not intended to diagnose MRSA infection nor to guide or monitor treatment for MRSA infections. Test performance is not FDA approved in patients less than 75 years old. Performed at [facility], [address], [place], [place] [address]   Culture, blood (Routine x 2)     Status: None (Preliminary result)   Collection Time: 05/04/24 10:45 AM   Specimen: BLOOD LEFT HAND  Result Value Ref Range Status   Specimen Description   Final    BLOOD LEFT HAND Performed at [facility], [address] [place], [place] [address]    Special Requests   Final    BOTTLES DRAWN AEROBIC AND ANAEROBIC Blood Culture results may not be optimal due to an inadequate volume of blood received in culture bottles Performed at [facility], [address], [place], [place] [address]    Culture   Final    NO GROWTH < 24 HOURS Performed at [facility], [address], [place], [place] [address]    Report Status PENDING   Incomplete  Respiratory (~20 pathogens) panel by PCR     Status: None   Collection Time: 05/04/24  3:09 PM   Specimen: Nasopharyngeal Swab; Respiratory  Result Value Ref Range Status   Adenovirus NOT DETECTED NOT DETECTED Final   Coronavirus 229E NOT DETECTED NOT DETECTED Final    Comment: (NOTE) The Coronavirus on the Respiratory Panel, DOES NOT test for the novel  Coronavirus (2019 nCoV)  Coronavirus HKU1 NOT DETECTED NOT DETECTED Final   Coronavirus NL63 NOT DETECTED NOT DETECTED Final   Coronavirus OC43 NOT DETECTED NOT DETECTED Final   Metapneumovirus NOT DETECTED NOT DETECTED Final   Rhinovirus / Enterovirus NOT DETECTED NOT DETECTED Final   Influenza A NOT DETECTED NOT DETECTED Final   Influenza B NOT DETECTED NOT DETECTED Final   Parainfluenza Virus 1 NOT DETECTED NOT DETECTED Final   Parainfluenza Virus 2 NOT DETECTED NOT DETECTED Final   Parainfluenza Virus 3 NOT DETECTED NOT DETECTED Final   Parainfluenza Virus 4 NOT DETECTED NOT DETECTED Final   Respiratory Syncytial Virus NOT DETECTED NOT DETECTED Final   Bordetella pertussis NOT DETECTED NOT DETECTED Final   Bordetella Parapertussis NOT DETECTED NOT DETECTED Final   Chlamydophila pneumoniae NOT DETECTED NOT DETECTED Final   Mycoplasma pneumoniae NOT DETECTED NOT DETECTED Final    Comment: Performed at [facility], [address] [place], [place] [address]  Culture, blood (Routine X 2) w Reflex to ID Panel     Status: None (Preliminary result)   Collection Time: 05/05/24  1:10 PM   Specimen: BLOOD RIGHT HAND  Result Value Ref Range Status   Specimen Description   Final    BLOOD RIGHT HAND Performed at [facility], [address] [place], [place] [address]    Special Requests   Final    BOTTLES DRAWN AEROBIC AND ANAEROBIC Blood Culture results may not be optimal due to an inadequate volume of blood received in culture bottles Performed at [facility], [address]  [place], [place] [address]    Culture PENDING  Incomplete   Report Status PENDING  Incomplete  Culture, blood (Routine X 2) w Reflex to ID Panel     Status: None (Preliminary result)   Collection Time: 05/05/24  1:10 PM   Specimen: BLOOD LEFT HAND  Result Value Ref Range Status   Specimen Description   Final    BLOOD LEFT HAND Performed at [facility], [address] [place], [place] [address]    Special Requests   Final    BOTTLES DRAWN AEROBIC ONLY Blood Culture results may not be optimal due to an inadequate volume of blood received in culture bottles Performed at [facility], [address], [place], [place] [address]    Culture PENDING  Incomplete   Report Status PENDING  Incomplete         Radiology Studies: ECHOCARDIOGRAM COMPLETE Result Date: 05/05/2024    ECHOCARDIOGRAM REPORT   Patient Name:   Eduardo [name] Date of Exam: 05/05/2024 Medical Rec #:  [id number]    Height:       69.0 in Accession #:    [id number]   Weight:       205.5 lb Date of Birth:  Nov 13, 1955   BSA:          2.090 m Patient Age:    67 years     BP:           99/61 mmHg Patient Gender: M            HR:           89 bpm. Exam Location:  Inpatient Procedure: 2D Echo, Cardiac Doppler and Color Doppler (Both Spectral and Color            Flow Doppler were utilized during procedure). Indications:    Endocarditis  History:  Patient has prior history of Echocardiogram examinations.                 Endocarditis; Risk Factors:Hypertension.  Sonographer:    Vella Key Referring Phys: [id number] [facility] IMPRESSIONS  1. Left ventricular ejection fraction, by estimation, is 20 to 25%. The left ventricle has severely decreased function. The left ventricle demonstrates global hypokinesis. The left ventricular internal cavity size was severely dilated. Left ventricular diastolic parameters are indeterminate.  2. Right ventricular systolic function is severely reduced. The right ventricular size is  severely enlarged. There is mildly elevated pulmonary artery systolic pressure. The estimated right ventricular systolic pressure is 39.8 mmHg.  3. Left atrial size was moderately dilated.  4. The mitral valve is normal in structure. Moderate mitral valve regurgitation. No evidence of mitral stenosis.  5. The aortic valve is tricuspid. There is moderate calcification of the aortic valve. Aortic valve regurgitation is not visualized. Aortic valve sclerosis/calcification is present, without any evidence of aortic stenosis.  6. The inferior vena cava is dilated in size with <50% respiratory variability, suggesting right atrial pressure of 15 mmHg. Conclusion(s)/Recommendation(s): No evidence of valvular vegetations on this transthoracic echocardiogram. Consider a transesophageal echocardiogram to exclude infective endocarditis if clinically indicated. FINDINGS  Left Ventricle: Left ventricular ejection fraction, by estimation, is 20 to 25%. The left ventricle has severely decreased function. The left ventricle demonstrates global hypokinesis. The left ventricular internal cavity size was severely dilated. There is no left ventricular hypertrophy. Left ventricular diastolic parameters are indeterminate. Right Ventricle: The right ventricular size is severely enlarged. No increase in right ventricular wall thickness. Right ventricular systolic function is severely reduced. There is mildly elevated pulmonary artery systolic pressure. The tricuspid regurgitant velocity is 2.49 m/s, and with an assumed right atrial pressure of 15 mmHg, the estimated right ventricular systolic pressure is 39.8 mmHg. Left Atrium: Left atrial size was moderately dilated. Right Atrium: Right atrial size was normal in size. Pericardium: There is no evidence of pericardial effusion. Mitral Valve: The mitral valve is normal in structure. Moderate mitral valve regurgitation. No evidence of mitral valve stenosis. Tricuspid Valve: The tricuspid valve  is normal in structure. Tricuspid valve regurgitation is mild . No evidence of tricuspid stenosis. Aortic Valve: The aortic valve is tricuspid. There is moderate calcification of the aortic valve. Aortic valve regurgitation is not visualized. Aortic valve sclerosis/calcification is present, without any evidence of aortic stenosis. Pulmonic Valve: The pulmonic valve was normal in structure. Pulmonic valve regurgitation is not visualized. No evidence of pulmonic stenosis. Aorta: The aortic root is normal in size and structure. Venous: The inferior vena cava is dilated in size with less than 50% respiratory variability, suggesting right atrial pressure of 15 mmHg. IAS/Shunts: No atrial level shunt detected by color flow Doppler. Additional Comments: A device lead is visualized.  TRICUSPID VALVE TV Peak grad:   35.0 mmHg TV Vmax:        2.96 m/s TR Peak grad:   24.8 mmHg TR Vmax:        249.00 cm/s Toribio Fuel MD Electronically signed by Toribio Fuel MD Signature Date/Time: 05/05/2024/2:22:30 PM    Final    CT CHEST ABDOMEN PELVIS WO CONTRAST Result Date: 05/04/2024 CLINICAL DATA:  Sepsis. EXAM: CT CHEST, ABDOMEN AND PELVIS WITHOUT CONTRAST TECHNIQUE: Multidetector CT imaging of the chest, abdomen and pelvis was performed following the standard protocol without IV contrast. RADIATION DOSE REDUCTION: This exam was performed according to the departmental dose-optimization program which includes automated exposure  control, adjustment of the mA and/or kV according to patient size and/or use of iterative reconstruction technique. COMPARISON:  CT scan chest from 03/09/2024 and CT scan abdomen and pelvis from 02/14/2024. FINDINGS: CT CHEST FINDINGS Cardiovascular: Normal cardiac size. No pericardial effusion. No aortic aneurysm. There are coronary artery calcifications, in keeping with coronary artery disease. There are also mild peripheral atherosclerotic vascular calcifications of thoracic aorta and its major  branches. Mediastinum/Nodes: Visualized thyroid  gland appears grossly unremarkable. No solid / cystic mediastinal masses. The esophagus is nondistended precluding optimal assessment. There are few mildly enlarged mediastinal lymph nodes, which appear grossly similar to the prior study. No axillary lymphadenopathy by size criteria. Evaluation of bilateral hila is limited due to lack on intravenous contrast: however, no large hilar lymphadenopathy identified. Lungs/Pleura: The central tracheo-bronchial tree is patent. There is mild, smooth, circumferential thickening of the segmental and subsegmental bronchial walls, throughout bilateral lungs, which is nonspecific. Findings are most commonly seen with bronchitis or reactive airway disease, such as asthma. There is moderate to large left and small-to-moderate right pleural effusion, stable on the left and increased on the right side, since the prior study from 03/09/2024. there are associated compressive atelectatic changes. There are patchy areas of linear, plate-like atelectasis and/or scarring throughout bilateral lungs. No pneumothorax. No suspicious lung nodule. Musculoskeletal: The visualized soft tissues of the chest wall are grossly unremarkable. Note is made of left-sided triple lead cardiac pacemaker. No suspicious osseous lesions. There are mild multilevel degenerative changes in the visualized spine. CT ABDOMEN PELVIS FINDINGS Hepatobiliary: The liver is normal in size. Non-cirrhotic configuration. Redemonstration of ill-defined, heterogeneous hypoattenuating approximately 9.4 x 12.6 cm mass in the right hepatic lobe, which appears increased in size since the prior study. No intrahepatic or extrahepatic bile duct dilation. The gallbladder is physiologically distended. Small volume layering gallstones/sludge noted without imaging signs of acute cholecystitis. No abnormal wall thickening. No pericholecystic fat stranding. Pancreas: Unremarkable. No  pancreatic ductal dilatation or surrounding inflammatory changes. Spleen: Within normal limits. No focal lesion. Adrenals/Urinary Tract: Adrenal glands are unremarkable. No suspicious renal mass within the limitations of this unenhanced exam. No nephroureterolithiasis or obstructive uropathy on either side. There is a partially exophytic cyst arising from the right kidney lower pole measuring approximately 1.4 x 2.0 cm. Redemonstration of mild asymmetric thickening of the anterior bladder wall without associated perivesical fat stranding. Findings are similar to the prior study and favored to represent sequela of chronic cystitis. No focal urinary bladder mass or bladder calculi. Stomach/Bowel: No disproportionate dilation of the small or large bowel loops. No evidence of abnormal bowel wall thickening or inflammatory changes. The appendix is unremarkable. Vascular/Lymphatic: There is small-to-moderate ascites, new since the prior study. No pneumoperitoneum. No abdominal or pelvic lymphadenopathy, by size criteria. No aneurysmal dilation of the major abdominal arteries. There are marked peripheral atherosclerotic vascular calcifications of the aorta and its major branches. Reproductive: Mildly enlarged prostate gland. Symmetric bilateral seminal vesicles. There is calcification of bilateral vas deferens, nonspecific but commonly seen in diabetic patients. Other: Inferior abdominal midline surgical scar noted. There are bilateral tiny fat containing inguinal hernias. There is mild-to-moderate anasarca. Musculoskeletal: No suspicious osseous lesions. There are mild - moderate multilevel degenerative changes in the visualized spine. IMPRESSION: 1. There is moderate to large left and small-to-moderate right pleural effusion, stable on the left and increased on the right side, since the prior study. There are associated compressive atelectatic changes. No lung mass, consolidation or pneumothorax. 2. Redemonstration of  ill-defined, heterogeneous  hypoattenuating approximately 9.4 x 12.6 cm mass in the right hepatic lobe, which appears increased in size since the prior study. 3. There is small-to-moderate ascites, new since the prior study. 4. Multiple other nonacute observations, as described above. Electronically Signed   By: Ree Molt M.D.   On: 05/04/2024 16:13   Scheduled Meds:  Chlorhexidine  Gluconate Cloth  6 each Topical Daily   feeding supplement (GLUCERNA SHAKE)  237 mL Oral TID BM   insulin  aspart  0-15 Units Subcutaneous TID WC   insulin  aspart  0-5 Units Subcutaneous QHS   midodrine   15 mg Oral TID WC   nitroGLYCERIN   1 inch Topical Q8H   ondansetron  (ZOFRAN ) IV  4 mg Intravenous Once   pantoprazole  (PROTONIX ) IV  40 mg Intravenous Q24H   Continuous Infusions:  ampicillin-sulbactam (UNASYN) IV Stopped (05/06/24 0558)   norepinephrine  (LEVOPHED ) Adult infusion Stopped (05/05/24 1652)     LOS: 2 days   Time spent:  Elsie JAYSON [place], DO [facility]  If 7PM-7AM, please contact night-coverage [email or web address]  05/06/2024, 7:14 AM

## 2024-05-06 NOTE — Progress Notes (Signed)
 Chart rview  Off pressors since yesterday evening Ccm wil sign off    SIGNATURE    Dr. Dorethia Cave, M.D., F.C.C.P,  Pulmonary and Critical Care Medicine Staff Physician, [facility] Center Director - Interstitial Lung Disease  Program  Pulmonary Fibrosis [facility] at [facility] [place], [place], [address]   Pager: [phone], If no answer  -> Check AMION or Try [phone] Telephone (clinical office): [phone] Telephone (research): [phone]  10:26 AM 05/06/2024

## 2024-05-07 DIAGNOSIS — R6520 Severe sepsis without septic shock: Secondary | ICD-10-CM | POA: Diagnosis not present

## 2024-05-07 DIAGNOSIS — A419 Sepsis, unspecified organism: Secondary | ICD-10-CM | POA: Diagnosis not present

## 2024-05-07 LAB — BASIC METABOLIC PANEL WITH GFR
Anion gap: 12 (ref 5–15)
BUN: 57 mg/dL — ABNORMAL HIGH (ref 8–23)
CO2: 23 mmol/L (ref 22–32)
Calcium: 8.6 mg/dL — ABNORMAL LOW (ref 8.9–10.3)
Chloride: 100 mmol/L (ref 98–111)
Creatinine, Ser: 1.94 mg/dL — ABNORMAL HIGH (ref 0.61–1.24)
GFR, Estimated: 37 mL/min — ABNORMAL LOW (ref >60)
Glucose, Bld: 132 mg/dL — ABNORMAL HIGH (ref 70–99)
Potassium: 4 mmol/L (ref 3.5–5.1)
Sodium: 135 mmol/L (ref 135–145)

## 2024-05-07 LAB — CBC
HCT: 40.4 % (ref 39.0–52.0)
Hemoglobin: 12.5 g/dL — ABNORMAL LOW (ref 13.0–17.0)
MCH: 28.9 pg (ref 26.0–34.0)
MCHC: 30.9 g/dL (ref 30.0–36.0)
MCV: 93.3 fL (ref 80.0–100.0)
Platelets: 157 10*3/uL (ref 150–400)
RBC: 4.33 MIL/uL (ref 4.22–5.81)
RDW: 18.3 % — ABNORMAL HIGH (ref 11.5–15.5)
WBC: 6.9 10*3/uL (ref 4.0–10.5)
nRBC: 0 % (ref 0.0–0.2)

## 2024-05-07 LAB — GLUCOSE, CAPILLARY
Glucose-Capillary: 115 mg/dL — ABNORMAL HIGH (ref 70–99)
Glucose-Capillary: 156 mg/dL — ABNORMAL HIGH (ref 70–99)
Glucose-Capillary: 299 mg/dL — ABNORMAL HIGH (ref 70–99)
Glucose-Capillary: 211 mg/dL — ABNORMAL HIGH (ref 70–99)

## 2024-05-07 MED ORDER — TORSEMIDE 20 MG PO TABS
30.0000 mg | ORAL_TABLET | Freq: Two times a day (BID) | ORAL | Status: DC
  Administered 2024-05-07 – 2024-05-09 (×4): 30 mg via ORAL
  Filled 2024-05-07 (×5): qty 1

## 2024-05-07 MED ORDER — TAMSULOSIN HCL 0.4 MG PO CAPS
0.4000 mg | ORAL_CAPSULE | Freq: Every day | ORAL | Status: DC
  Administered 2024-05-07 – 2024-05-08 (×2): 0.4 mg via ORAL
  Filled 2024-05-07 (×2): qty 1

## 2024-05-07 NOTE — Plan of Care (Signed)

## 2024-05-07 NOTE — Progress Notes (Signed)
 PROGRESS NOTE    Eduardo [name]  FMW:[id number] DOB: [birth year] DOA: 05/03/2024 PCP: [name], MD   Brief Narrative:  Eduardo [name] is a 68 y.o. 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  Assessment & Plan:   Principal Problem:   Severe sepsis (HCC) Active Problems:   Sepsis (HCC)   Diabetes mellitus (HCC)   Stage 3b chronic kidney disease (CKD) (HCC)   Essential hypertension, benign   Cholangiocarcinoma (HCC)  Septic shock, resolving Secondary to group B strep bacteremia Meeting sepsis criteria with Tmax 103.3, HR 100s, WBC 12.6  Lactate 3.2-->2.2 requiring pressors to maintain MAP during acute illness  Appreciate infectious disease insight recommendations -transition to Unasyn given cultures Will need Gram-positive bacteremia workup Echo 6/27 - EF 20-25%, LV global hypokinesis, RV enlarged, Aortic calcification/sclerosis TEE pending EP involved as well given patient's ICD implantation Repeat cultures per protocol Transfer to main campus for TEE and EP evaluation  Hypotension Prior history of essential hypertension SBP 80s-100s in setting of sepsis  Borderline hypotension over the last month in clinic, diastolic blood pressure ranging 80-100 Hold BP regimen, heart failure core measures limited by hypotension Cont midodrine  15 TID Remains generally asymptomatic despite hypotension, without tachycardia   Cholangiocarcinoma (HCC) Noted recent diagnosis of cholangiocarcinoma  Not deemed to be a candidate for advanced therapies/surgical intervention  ID concerned this may be a source of seeding given highly vascular involvement    HFrEF (heart failure with reduced ejection fraction) (HCC) 2D ECHO 03/2024 w/ EF 20-25%, RV moderately reduced  Recently admitted May 16 through 27 Resume  torsemide  at half dose, follow clinically, appears euvolemic at this time   Stage 3b chronic kidney disease (CKD) (HCC) Baseline Cr 1.5-1.7 w/ GFR in the 40s  Looks to be near baseline today  Monitor w/ treatment   BPH  Continue tamsulosin   Diabetes mellitus (HCC) Blood sugar in 200s  SSI  A1C   DVT prophylaxis: enoxaparin  (LOVENOX ) injection 40 mg Start: 05/06/24 1200 SCDs Start: 05/04/24 0435 Code Status:   Code Status: Do not attempt resuscitation (DNR) PRE-ARREST INTERVENTIONS DESIRED Family Communication: None present  Status is: Inpatient  Dispo: The patient is from: Home              Anticipated d/c is to: To be determined              Anticipated d/c date is: To be determined              Patient currently not medically stable for discharge  Consultants:  ID, EP, PCCM  Procedures:  None  Antimicrobials:  Unasyn  Subjective: No acute issues or events overnight, feels quite well denies nausea vomiting diarrhea constipation high fevers chills chest pain  Objective: Vitals:   05/06/24 2200 05/06/24 2300 05/07/24 0044 05/07/24 0400  BP: 98/61 118/72    Pulse: 73 81    Resp: 19 19    Temp:   (!) 97.3 F (36.3 C) (!) 97.4 F (36.3 C)  TempSrc:   Oral Oral  SpO2: 97% 98%    Weight:      Height:        Intake/Output Summary (Last 24 hours) at 05/07/2024 0655 Last data filed at 05/06/2024 1830 Gross per 24 hour  Intake 501.25 ml  Output 550 ml  Net -48.75 ml   [employer]  05/04/24 0500 05/05/24 0221 05/06/24 0439  Weight: 92 kg 93.2 kg 95 kg    Examination:  General: Pleasantly resting in bed, No acute distress. HEENT: Normocephalic atraumatic.  Sclerae nonicteric, noninjected.  Extraocular movements intact bilaterally. Neck: Without mass or deformity.  Trachea is midline. Lungs: Clear to auscultate bilaterally without rhonchi, wheeze, or rales. Heart: Regular rate and rhythm.  Without murmurs, rubs, or gallops. Abdomen: Soft, nontender,  nondistended.  Without guarding or rebound. Extremities: Without cyanosis, clubbing, edema, Right BKA Skin: Warm and dry, no erythema.  Data Reviewed: I have personally reviewed following labs and imaging studies  CBC: Recent Labs  Lab 05/03/24 2338 05/04/24 0513 05/07/24 0521  WBC 12.6* 14.9* 6.9  NEUTROABS 11.7* 13.6*  --   HGB 14.0 11.9* 12.5*  HCT 44.8 37.7* 40.4  MCV 90.5 88.9 93.3  PLT 222 162 157   Basic Metabolic Panel: Recent Labs  Lab 05/03/24 2338 05/04/24 0513 05/07/24 0521  NA 139 138 135  K 4.4 3.6 4.0  CL 99 98 100  CO2 23 25 23   GLUCOSE 272* 212* 132*  BUN 43* 45* 57*  CREATININE 1.64* 1.71* 1.94*  CALCIUM  9.4 9.0 8.6*  MG  --  2.1  --    GFR: Estimated Creatinine Clearance: 41.4 mL/min (A) (by C-G formula based on SCr of 1.94 mg/dL (H)). Liver Function Tests: Recent Labs  Lab 05/03/24 2338 05/04/24 0513  AST 34 36  ALT 16 17  ALKPHOS 118 100  BILITOT 1.3* 1.5*  PROT 6.5 5.8*  ALBUMIN  3.5 3.2*   No results for input(s): LIPASE, AMYLASE in the last 168 hours. No results for input(s): AMMONIA in the last 168 hours. Coagulation Profile: Recent Labs  Lab 05/03/24 2338 05/04/24 0513  INR 1.2 1.3*   Cardiac Enzymes: No results for input(s): CKTOTAL, CKMB, CKMBINDEX, TROPONINI in the last 168 hours. BNP (last 3 results) No results for input(s): PROBNP in the last 8760 hours. HbA1C: No results for input(s): HGBA1C in the last 72 hours. CBG: Recent Labs  Lab 05/06/24 0804 05/06/24 1229 05/06/24 1540 05/06/24 1716 05/06/24 2131  GLUCAP 151* 215* 171* 179* 183*   Lipid Profile: No results for input(s): CHOL, HDL, LDLCALC, TRIG, CHOLHDL, LDLDIRECT in the last 72 hours. Thyroid  Function Tests: No results for input(s): TSH, T4TOTAL, FREET4, T3FREE, THYROIDAB in the last 72 hours. Anemia Panel: No results for input(s): VITAMINB12, FOLATE, FERRITIN, TIBC, IRON , RETICCTPCT in the last 72  hours. Sepsis Labs: Recent Labs  Lab 05/04/24 0047 05/04/24 0139 05/04/24 0513 05/04/24 1045  PROCALCITON  --   --  13.47  --   LATICACIDVEN 3.2* 2.3*  --  1.8    Recent Results (from the past 240 hours)  Resp panel by RT-PCR (RSV, Flu A&B, Covid) Anterior Nasal Swab     Status: None   Collection Time: 05/03/24 11:38 PM   Specimen: Anterior Nasal Swab  Result Value Ref Range Status   SARS Coronavirus 2 by RT PCR NEGATIVE NEGATIVE Final    Comment: (NOTE) SARS-CoV-2 target nucleic acids are NOT DETECTED.  The SARS-CoV-2 RNA is generally detectable in upper respiratory specimens during the acute phase of infection. The lowest concentration of SARS-CoV-2 viral copies this assay can detect is 138 copies/mL. A negative result does not preclude SARS-Cov-2 infection and should not be used as the sole basis for treatment or other patient management decisions. A negative result may occur with  improper specimen collection/handling, submission of specimen other than nasopharyngeal swab, presence of viral  mutation(s) within the areas targeted by this assay, and inadequate number of viral copies(<138 copies/mL). A negative result must be combined with clinical observations, patient history, and epidemiological information. The expected result is Negative.  Fact Sheet for Patients:  BloggerCourse.com  Fact Sheet for Healthcare Providers:  SeriousBroker.it  This test is no t yet approved or cleared by the [place]  FDA and  has been authorized for detection and/or diagnosis of SARS-CoV-2 by FDA under an Emergency Use Authorization (EUA). This EUA will remain  in effect (meaning this test can be used) for the duration of the COVID-19 declaration under Section 564(b)(1) of the Act, 21 U.S.C.section 360bbb-3(b)(1), unless the authorization is terminated  or revoked sooner.       Influenza A by PCR NEGATIVE NEGATIVE Final    Influenza B by PCR NEGATIVE NEGATIVE Final    Comment: (NOTE) The Xpert Xpress SARS-CoV-2/FLU/RSV plus assay is intended as an aid in the diagnosis of influenza from Nasopharyngeal swab specimens and should not be used as a sole basis for treatment. Nasal washings and aspirates are unacceptable for Xpert Xpress SARS-CoV-2/FLU/RSV testing.  Fact Sheet for Patients: BloggerCourse.com  Fact Sheet for Healthcare Providers: SeriousBroker.it  This test is not yet approved or cleared by the [place]  FDA and has been authorized for detection and/or diagnosis of SARS-CoV-2 by FDA under an Emergency Use Authorization (EUA). This EUA will remain in effect (meaning this test can be used) for the duration of the COVID-19 declaration under Section 564(b)(1) of the Act, 21 U.S.C. section 360bbb-3(b)(1), unless the authorization is terminated or revoked.     Resp Syncytial Virus by PCR NEGATIVE NEGATIVE Final    Comment: (NOTE) Fact Sheet for Patients: BloggerCourse.com  Fact Sheet for Healthcare Providers: SeriousBroker.it  This test is not yet approved or cleared by the [place]  FDA and has been authorized for detection and/or diagnosis of SARS-CoV-2 by FDA under an Emergency Use Authorization (EUA). This EUA will remain in effect (meaning this test can be used) for the duration of the COVID-19 declaration under Section 564(b)(1) of the Act, 21 U.S.C. section 360bbb-3(b)(1), unless the authorization is terminated or revoked.  Performed at [facility], [address] [place], [place] [address]   Culture, blood (Routine x 2)     Status: Abnormal (Preliminary result)   Collection Time: 05/04/24 12:30 AM   Specimen: BLOOD  Result Value Ref Range Status   Specimen Description   Final    BLOOD LEFT ANTECUBITAL Performed at [facility], [address] [place], [place] [address]    Special Requests   Final    BOTTLES DRAWN AEROBIC AND ANAEROBIC Blood Culture adequate volume Performed at [facility], [address] [place], [place] [address]    Culture  Setup Time   Final    GRAM POSITIVE COCCI IN BOTH AEROBIC AND ANAEROBIC BOTTLES CRITICAL RESULT CALLED TO, READ BACK BY AND VERIFIED WITH: PHARMD E JACKSON 05/05/2024 @ 0255 BY AB    Culture (A)  Final    GROUP B STREP(S.AGALACTIAE)ISOLATED CULTURE REINCUBATED FOR BETTER GROWTH Performed at [facility], [address] [place], [place] [address]    Report Status PENDING  Incomplete  Blood Culture ID Panel (Reflexed)     Status: Abnormal   Collection Time: 05/04/24 12:30 AM  Result Value Ref Range Status   Enterococcus faecalis NOT DETECTED NOT DETECTED Final   Enterococcus Faecium NOT DETECTED NOT DETECTED Final   Listeria  monocytogenes NOT DETECTED NOT DETECTED Final   Staphylococcus species NOT DETECTED NOT DETECTED Final   Staphylococcus aureus (BCID) NOT DETECTED NOT DETECTED Final   Staphylococcus epidermidis NOT DETECTED NOT DETECTED Final   Staphylococcus lugdunensis NOT DETECTED NOT DETECTED Final   Streptococcus species DETECTED (A) NOT DETECTED Final    Comment: CRITICAL RESULT CALLED TO, READ BACK BY AND VERIFIED WITH: PHARMD E JACKSON 05/05/2024 @ 0255 BY AB    Streptococcus agalactiae DETECTED (A) NOT DETECTED Final    Comment: CRITICAL RESULT CALLED TO, READ BACK BY AND VERIFIED WITH: PHARMD E JACKSON 05/05/2024 @ 0255 BY AB    Streptococcus pneumoniae NOT DETECTED NOT DETECTED Final   Streptococcus pyogenes NOT DETECTED NOT DETECTED Final   A.calcoaceticus-baumannii NOT DETECTED NOT DETECTED Final   Bacteroides fragilis NOT DETECTED NOT DETECTED Final   Enterobacterales NOT DETECTED NOT DETECTED Final   Enterobacter cloacae complex NOT DETECTED NOT DETECTED Final   Escherichia coli NOT DETECTED NOT DETECTED Final   Klebsiella  aerogenes NOT DETECTED NOT DETECTED Final   Klebsiella oxytoca NOT DETECTED NOT DETECTED Final   Klebsiella pneumoniae NOT DETECTED NOT DETECTED Final   Proteus species NOT DETECTED NOT DETECTED Final   Salmonella species NOT DETECTED NOT DETECTED Final   Serratia marcescens NOT DETECTED NOT DETECTED Final   Haemophilus influenzae NOT DETECTED NOT DETECTED Final   Neisseria meningitidis NOT DETECTED NOT DETECTED Final   Pseudomonas aeruginosa NOT DETECTED NOT DETECTED Final   Stenotrophomonas maltophilia NOT DETECTED NOT DETECTED Final   Candida albicans NOT DETECTED NOT DETECTED Final   Candida auris NOT DETECTED NOT DETECTED Final   Candida glabrata NOT DETECTED NOT DETECTED Final   Candida krusei NOT DETECTED NOT DETECTED Final   Candida parapsilosis NOT DETECTED NOT DETECTED Final   Candida tropicalis NOT DETECTED NOT DETECTED Final   Cryptococcus neoformans/gattii NOT DETECTED NOT DETECTED Final    Comment: Performed at [facility], [address] [place], [place] [address]  MRSA Next Gen by PCR, Nasal     Status: None   Collection Time: 05/04/24  9:53 AM   Specimen: Nasal Mucosa; Nasal Swab  Result Value Ref Range Status   MRSA by PCR Next Gen NOT DETECTED NOT DETECTED Final    Comment: (NOTE) The GeneXpert MRSA Assay (FDA approved for NASAL specimens only), is one component of a comprehensive MRSA colonization surveillance program. It is not intended to diagnose MRSA infection nor to guide or monitor treatment for MRSA infections. Test performance is not FDA approved in patients less than 53 years old. Performed at [facility], [address], [place], [place] [address]   Culture, blood (Routine x 2)     Status: None (Preliminary result)   Collection Time: 05/04/24 10:45 AM   Specimen: BLOOD LEFT HAND  Result Value Ref Range Status   Specimen Description   Final    BLOOD LEFT HAND Performed at [facility], [address]  [place], [place] [address]    Special Requests   Final    BOTTLES DRAWN AEROBIC AND ANAEROBIC Blood Culture results may not be optimal due to an inadequate volume of blood received in culture bottles Performed at [facility], [address], [place], [place] [address]    Culture   Final    NO GROWTH 2 DAYS Performed at [facility], [address] [place], [place] [address]    Report Status PENDING  Incomplete  Respiratory (~20  pathogens) panel by PCR     Status: None   Collection Time: 05/04/24  3:09 PM   Specimen: Nasopharyngeal Swab; Respiratory  Result Value Ref Range Status   Adenovirus NOT DETECTED NOT DETECTED Final   Coronavirus 229E NOT DETECTED NOT DETECTED Final    Comment: (NOTE) The Coronavirus on the Respiratory Panel, DOES NOT test for the novel  Coronavirus (2019 nCoV)    Coronavirus HKU1 NOT DETECTED NOT DETECTED Final   Coronavirus NL63 NOT DETECTED NOT DETECTED Final   Coronavirus OC43 NOT DETECTED NOT DETECTED Final   Metapneumovirus NOT DETECTED NOT DETECTED Final   Rhinovirus / Enterovirus NOT DETECTED NOT DETECTED Final   Influenza A NOT DETECTED NOT DETECTED Final   Influenza B NOT DETECTED NOT DETECTED Final   Parainfluenza Virus 1 NOT DETECTED NOT DETECTED Final   Parainfluenza Virus 2 NOT DETECTED NOT DETECTED Final   Parainfluenza Virus 3 NOT DETECTED NOT DETECTED Final   Parainfluenza Virus 4 NOT DETECTED NOT DETECTED Final   Respiratory Syncytial Virus NOT DETECTED NOT DETECTED Final   Bordetella pertussis NOT DETECTED NOT DETECTED Final   Bordetella Parapertussis NOT DETECTED NOT DETECTED Final   Chlamydophila pneumoniae NOT DETECTED NOT DETECTED Final   Mycoplasma pneumoniae NOT DETECTED NOT DETECTED Final    Comment: Performed at [facility], [address] [place], [place] [address]  Culture, blood (Routine X 2) w Reflex to ID Panel     Status: None (Preliminary result)   Collection Time: 05/05/24  1:10 PM    Specimen: BLOOD RIGHT HAND  Result Value Ref Range Status   Specimen Description   Final    BLOOD RIGHT HAND Performed at [facility], [address] [place], [place] [address]    Special Requests   Final    BOTTLES DRAWN AEROBIC AND ANAEROBIC Blood Culture results may not be optimal due to an inadequate volume of blood received in culture bottles Performed at [facility] [place], [address] [place], [place] [address]    Culture   Final    NO GROWTH < 24 HOURS Performed at [facility], [address] [place], [place] [address]    Report Status PENDING  Incomplete  Culture, blood (Routine X 2) w Reflex to ID Panel     Status: None (Preliminary result)   Collection Time: 05/05/24  1:10 PM   Specimen: BLOOD LEFT HAND  Result Value Ref Range Status   Specimen Description   Final    BLOOD LEFT HAND Performed at [facility], [address], [place], [place] [address]    Special Requests   Final    BOTTLES DRAWN AEROBIC ONLY Blood Culture results may not be optimal due to an inadequate volume of blood received in culture bottles Performed at [facility], [address], [place], [place] [address]    Culture   Final    NO GROWTH < 24 HOURS Performed at [facility], [address], [place], [place] [address]    Report Status PENDING  Incomplete         Radiology Studies: ECHOCARDIOGRAM COMPLETE Result Date: 05/05/2024    ECHOCARDIOGRAM REPORT   Patient Name:   Eduardo [name] Date of Exam: 05/05/2024 Medical Rec #:  [id number]    Height:       69.0 in Accession #:    [id number]   Weight:       205.5 lb Date of  Birth:  [birth year]   BSA:          2.090 m Patient Age:    67 years     BP:           99/61 mmHg Patient Gender: M            HR:           89 bpm. Exam Location:  Inpatient Procedure: 2D Echo, Cardiac Doppler and Color Doppler (Both Spectral and Color            Flow Doppler were utilized during procedure).  Indications:    Endocarditis  History:        Patient has prior history of Echocardiogram examinations.                 Endocarditis; Risk Factors:Hypertension.  Sonographer:    Vella Key Referring Phys: [id number] [facility] IMPRESSIONS  1. Left ventricular ejection fraction, by estimation, is 20 to 25%. The left ventricle has severely decreased function. The left ventricle demonstrates global hypokinesis. The left ventricular internal cavity size was severely dilated. Left ventricular diastolic parameters are indeterminate.  2. Right ventricular systolic function is severely reduced. The right ventricular size is severely enlarged. There is mildly elevated pulmonary artery systolic pressure. The estimated right ventricular systolic pressure is 39.8 mmHg.  3. Left atrial size was moderately dilated.  4. The mitral valve is normal in structure. Moderate mitral valve regurgitation. No evidence of mitral stenosis.  5. The aortic valve is tricuspid. There is moderate calcification of the aortic valve. Aortic valve regurgitation is not visualized. Aortic valve sclerosis/calcification is present, without any evidence of aortic stenosis.  6. The inferior vena cava is dilated in size with <50% respiratory variability, suggesting right atrial pressure of 15 mmHg. Conclusion(s)/Recommendation(s): No evidence of valvular vegetations on this transthoracic echocardiogram. Consider a transesophageal echocardiogram to exclude infective endocarditis if clinically indicated. FINDINGS  Left Ventricle: Left ventricular ejection fraction, by estimation, is 20 to 25%. The left ventricle has severely decreased function. The left ventricle demonstrates global hypokinesis. The left ventricular internal cavity size was severely dilated. There is no left ventricular hypertrophy. Left ventricular diastolic parameters are indeterminate. Right Ventricle: The right ventricular size is severely enlarged. No increase in right ventricular wall  thickness. Right ventricular systolic function is severely reduced. There is mildly elevated pulmonary artery systolic pressure. The tricuspid regurgitant velocity is 2.49 m/s, and with an assumed right atrial pressure of 15 mmHg, the estimated right ventricular systolic pressure is 39.8 mmHg. Left Atrium: Left atrial size was moderately dilated. Right Atrium: Right atrial size was normal in size. Pericardium: There is no evidence of pericardial effusion. Mitral Valve: The mitral valve is normal in structure. Moderate mitral valve regurgitation. No evidence of mitral valve stenosis. Tricuspid Valve: The tricuspid valve is normal in structure. Tricuspid valve regurgitation is mild . No evidence of tricuspid stenosis. Aortic Valve: The aortic valve is tricuspid. There is moderate calcification of the aortic valve. Aortic valve regurgitation is not visualized. Aortic valve sclerosis/calcification is present, without any evidence of aortic stenosis. Pulmonic Valve: The pulmonic valve was normal in structure. Pulmonic valve regurgitation is not visualized. No evidence of pulmonic stenosis. Aorta: The aortic root is normal in size and structure. Venous: The inferior vena cava is dilated in size with less than 50% respiratory variability, suggesting right atrial pressure of 15 mmHg. IAS/Shunts: No atrial level shunt detected by color flow Doppler. Additional Comments: A device lead is visualized.  TRICUSPID VALVE TV Peak grad:   35.0 mmHg TV Vmax:        2.96 m/s TR Peak grad:   24.8 mmHg TR Vmax:        249.00 cm/s Toribio Fuel MD Electronically signed by Toribio Fuel MD Signature Date/Time: 05/05/2024/2:22:30 PM    Final    Scheduled Meds:  Chlorhexidine  Gluconate Cloth  6 each Topical Daily   enoxaparin  (LOVENOX ) injection  40 mg Subcutaneous Q24H   feeding supplement (GLUCERNA SHAKE)  237 mL Oral TID BM   insulin  aspart  0-15 Units Subcutaneous TID WC   insulin  aspart  0-5 Units Subcutaneous QHS    midodrine   15 mg Oral TID WC   nitroGLYCERIN   1 inch Topical Q8H   ondansetron  (ZOFRAN ) IV  4 mg Intravenous Once   pantoprazole  (PROTONIX ) IV  40 mg Intravenous Q24H   Continuous Infusions:  ampicillin-sulbactam (UNASYN) IV 3 g (05/07/24 0455)     LOS: 3 days   Time spent:  Elsie JAYSON Montclair, DO [facility]  If 7PM-7AM, please contact night-coverage [email or web address]  05/07/2024, 6:55 AM

## 2024-05-07 NOTE — Progress Notes (Signed)
 Daily Progress Note   Patient Name: Eduardo [name]       Date: 05/07/2024 DOB: April 24, 1956  Age: 68 y.o. MRN#: [id number] Attending Physician: Lue Elsie BROCKS, MD Primary Care Physician: [name], MD Admit Date: 05/03/2024  Reason for Consultation/Follow-up: Establishing goals of care  Subjective: Awake alert, sitting in chair, no distress.   Length of Stay: 3  Current Medications: Scheduled Meds:   Chlorhexidine  Gluconate Cloth  6 each Topical Daily   enoxaparin  (LOVENOX ) injection  40 mg Subcutaneous Q24H   feeding supplement (GLUCERNA SHAKE)  237 mL Oral TID BM   insulin  aspart  0-15 Units Subcutaneous TID WC   insulin  aspart  0-5 Units Subcutaneous QHS   midodrine   15 mg Oral TID WC   ondansetron  (ZOFRAN ) IV  4 mg Intravenous Once   pantoprazole  (PROTONIX ) IV  40 mg Intravenous Q24H   tamsulosin   0.4 mg Oral QPC supper   torsemide   30 mg Oral BID    Continuous Infusions:  ampicillin-sulbactam (UNASYN) IV Stopped (05/07/24 1029)    PRN Meds: acetaminophen  **OR** acetaminophen , alum & mag hydroxide-simeth, calcium  carbonate, melatonin, ondansetron  (ZOFRAN ) IV, mouth rinse, polyethylene glycol, simethicone   Physical Exam         Awake alert Sitting in chair Monitor noted 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  Vital Signs: BP 102/76   Pulse 77   Temp 98.4 F (36.9 C) (Oral)   Resp 20   Ht 5' 9 (1.753 m)   Wt 94.6 kg   SpO2 97%   BMI 30.80 kg/m  SpO2: SpO2: 97 % O2 Device: O2 Device: Room Air O2 Flow Rate: O2 Flow Rate (L/min): 2 L/min  Intake/output summary:  Intake/Output Summary (Last 24 hours) at 05/07/2024 1535 Last data filed at 05/07/2024 1258 Gross per 24 hour  Intake 1026.47 ml  Output 750 ml  Net 276.47 ml   LBM: Last BM Date : 05/06/24 Baseline Weight:  Weight: 95.1 kg Most recent weight: Weight: 94.6 kg       Palliative Assessment/Data:      Patient Active Problem List   Diagnosis Date Noted   Severe sepsis (HCC) 05/04/2024   Sepsis (HCC) 05/04/2024   Advanced care planning/counseling discussion 04/24/2024   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   HFrEF (heart failure with reduced ejection fraction) (HCC) 03/24/2024   Small  bowel obstruction (HCC) 11/14/2023   Small bowel obstruction due to adhesions (HCC) 11/14/2023   ICD (implantable cardioverter-defibrillator) in place 02/04/2023   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   Chronic post-operative pain 09/18/2022   Abnormality of gait 09/18/2022   LBBB (left bundle branch block) 08/19/2022   Chronic systolic heart failure (HCC) 08/19/2022   Chronic right-sided congestive heart failure (HCC) 05/18/2022   Ejection fraction < 50% 04/17/2022   Insomnia    Constipation    S/P BKA (below knee amputation), right (HCC) 03/21/2022   Ischemic cardiomyopathy    AKI (acute kidney injury) (HCC) 03/13/2022   Streptococcal bacteremia 03/13/2022   Hypophosphatemia 03/13/2022   Septic shock (HCC) 03/08/2022   Acute systolic heart failure (HCC) 12/28/2020   Acute pulmonary edema (HCC) 10/11/2020   Non-ST elevation (NSTEMI) myocardial infarction (HCC) 10/11/2020   Acute respiratory distress 10/11/2020   Acute on chronic combined systolic and diastolic CHF (congestive heart failure) (HCC) 10/11/2020   Cardiogenic shock (HCC)    Partial nontraumatic amputation of foot, right (HCC) 06/21/2019   Osteomyelitis of right foot (HCC)    Essential hypertension, benign 02/06/2019   Hypercholesteremia 02/06/2019   Stage 3b chronic kidney disease (CKD) (HCC) 10/06/2017   Proteinuria 10/06/2017   Controlled type 2 diabetes mellitus with hyperglycemia, with long-term current use of insulin  (HCC) 10/06/2017   Coronary artery disease of native artery of native heart with stable  angina pectoris (HCC) 05/23/2017   Abnormal nuclear stress test 09/06/2016   PAD (peripheral artery disease) (HCC) 10/28/2012   Morbid obesity with BMI of 40.0-44.9, adult (HCC) 10/13/2012   Diabetes mellitus with ophthalmic complication (HCC) 10/13/2012   Hypertension associated with diabetes (HCC) 10/13/2012   Atherosclerosis of native artery of extremity with intermittent claudication (HCC) 04/05/2012   Adenomatous colon polyp 09/28/2011   Diabetes mellitus (HCC) 09/28/2011   Pure hyperglyceridemia 09/28/2011   Erectile dysfunction 09/28/2011   Diabetic retinopathy (HCC) 09/28/2011    Palliative Care Assessment & Plan   Patient Profile:  Eduardo [name] is a 68 y.o. 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   Assessment:  Will likely need TEE Remains on antibiotics Has oncology appt with Dr Cloretta on 7-3  Recommendations/Plan: Continue current mode of care DNR Goals of care: long talk with patient regarding his current condition, multiple acute and underlying illnesses at play, overall plan. He is on board with having outpatient palliative care follow him in the future.     Code Status:    Code Status Orders  (From admission, onward)           Start     Ordered   05/04/24 0435  Do not attempt resuscitation (DNR) Pre-Arrest Interventions Desired  Continuous       Question Answer Comment  If pulseless and not breathing No CPR or chest compressions.   In Pre-Arrest Conditions (Patient Has Pulse and Is Breathing) May intubate, use advanced airway interventions and cardioversion/ACLS medications if appropriate or indicated. May transfer to ICU.   Consent: Discussion documented in EHR or advanced directives reviewed      05/04/24 0434           Code Status History     Date Active  Date Inactive Code Status Order ID Comments User Context   03/28/2024 1226 04/04/2024 2034 Do not attempt resuscitation (  DNR) PRE-ARREST INTERVENTIONS DESIRED [id number]  Lee, [place], NP Inpatient   03/24/2024 2147 03/28/2024 1226 Full Code [id number]  Charlott Lue DEL, MD Inpatient   03/16/2024 [phone] 03/17/2024 0517 Do not attempt resuscitation (DNR) - Comfort care [id number]  Jennefer Ester PARAS, MD HOV   03/06/2024 0916 03/07/2024 0516 Full Code [id number]  Johann Sieving, MD HOV   11/14/2023 0720 11/18/2023 1618 Full Code [id number]  Lue Elsie BROCKS, MD Inpatient   11/14/2023 0650 11/14/2023 0720 Full Code [id number]  Kinsinger, Herlene Righter, MD Inpatient   10/21/2022 1500 10/22/2022 0019 Full Code [id number]  Waddell Danelle ORN, MD Inpatient   03/21/2022 1628 03/31/2022 1703 Partial Code [id number]  Rosendo, Nena PARAS, PA-C Inpatient   03/21/2022 1628 03/21/2022 1628 Partial Code [id number]  Rosendo Nena PARAS, PA-C Inpatient   03/09/2022 1159 03/21/2022 1625 Partial Code [id number]  Aniceto Daphne CROME, NP Inpatient   03/09/2022 0821 03/09/2022 1159 DNR [id number]  Annella Donnice SAUNDERS, MD Inpatient   03/08/2022 1834 03/09/2022 0821 Full Code [id number]  Alaine Vicenta NOVAK, MD Inpatient   12/28/2020 1325 01/02/2021 1705 DNR [id number]  Elmira Newman PARAS, MD ED   10/14/2020 1343 10/15/2020 1703 DNR [id number]  Lord Levada CROME, PA-C Inpatient   10/11/2020 0719 10/14/2020 1343 Full Code [id number]  Ladona Heinz, MD Inpatient   05/02/2019 0914 05/02/2019 1635 Full Code [id number]  Ladona Heinz, MD Inpatient   02/28/2019 1047 02/28/2019 1710 Full Code [id number]  Ladona Heinz, MD Inpatient       Prognosis:  Unable to determine  Discharge Planning: Recommend outpatient palliative.   Care plan was discussed with patient.   Thank you for allowing the Palliative Medicine Team to assist in the care of this patient. Mod MDM.      Greater than 50%  of this time was spent counseling and coordinating care related to the above assessment and plan.  Lonia Serve, MD  Please contact Palliative Medicine Team phone at [phone] for questions and concerns.

## 2024-05-08 ENCOUNTER — Other Ambulatory Visit: Payer: Self-pay

## 2024-05-08 DIAGNOSIS — Z89511 Acquired absence of right leg below knee: Secondary | ICD-10-CM

## 2024-05-08 DIAGNOSIS — I4729 Other ventricular tachycardia: Secondary | ICD-10-CM

## 2024-05-08 DIAGNOSIS — A419 Sepsis, unspecified organism: Secondary | ICD-10-CM | POA: Diagnosis not present

## 2024-05-08 DIAGNOSIS — I739 Peripheral vascular disease, unspecified: Secondary | ICD-10-CM

## 2024-05-08 DIAGNOSIS — Z95810 Presence of automatic (implantable) cardiac defibrillator: Secondary | ICD-10-CM

## 2024-05-08 DIAGNOSIS — R6520 Severe sepsis without septic shock: Secondary | ICD-10-CM | POA: Diagnosis not present

## 2024-05-08 DIAGNOSIS — I5022 Chronic systolic (congestive) heart failure: Secondary | ICD-10-CM | POA: Diagnosis not present

## 2024-05-08 DIAGNOSIS — I429 Cardiomyopathy, unspecified: Secondary | ICD-10-CM | POA: Diagnosis not present

## 2024-05-08 DIAGNOSIS — I2510 Atherosclerotic heart disease of native coronary artery without angina pectoris: Secondary | ICD-10-CM | POA: Diagnosis not present

## 2024-05-08 DIAGNOSIS — I5084 End stage heart failure: Secondary | ICD-10-CM

## 2024-05-08 DIAGNOSIS — B951 Streptococcus, group B, as the cause of diseases classified elsewhere: Secondary | ICD-10-CM

## 2024-05-08 LAB — CULTURE, BLOOD (ROUTINE X 2): Special Requests: ADEQUATE

## 2024-05-08 LAB — GLUCOSE, CAPILLARY
Glucose-Capillary: 176 mg/dL — ABNORMAL HIGH (ref 70–99)
Glucose-Capillary: 268 mg/dL — ABNORMAL HIGH (ref 70–99)
Glucose-Capillary: 252 mg/dL — ABNORMAL HIGH (ref 70–99)
Glucose-Capillary: 198 mg/dL — ABNORMAL HIGH (ref 70–99)

## 2024-05-08 MED ORDER — SODIUM CHLORIDE 0.9% FLUSH
10.0000 mL | Freq: Two times a day (BID) | INTRAVENOUS | Status: DC
  Administered 2024-05-08: 20 mL
  Administered 2024-05-09: 10 mL

## 2024-05-08 MED ORDER — SODIUM CHLORIDE 0.9 % IV SOLN
2.0000 g | Freq: Every day | INTRAVENOUS | Status: DC
  Administered 2024-05-08 – 2024-05-09 (×2): 2 g via INTRAVENOUS
  Filled 2024-05-08 (×2): qty 20

## 2024-05-08 MED ORDER — SODIUM CHLORIDE 0.9% FLUSH: 10.0000 mL | INTRAVENOUS | Status: DC | PRN

## 2024-05-08 NOTE — Progress Notes (Signed)
 Peripherally Inserted Central Catheter Placement  The IV Nurse has discussed with the patient and/or persons authorized to consent for the patient, the purpose of this procedure and the potential benefits and risks involved with this procedure.  The benefits include less needle sticks, lab draws from the catheter, and the patient may be discharged home with the catheter. Risks include, but not limited to, infection, bleeding, blood clot (thrombus formation), and puncture of an artery; nerve damage and irregular heartbeat and possibility to perform a PICC exchange if needed/ordered by physician.  Alternatives to this procedure were also discussed.  Bard Power PICC patient education guide, fact sheet on infection prevention and patient information card has been provided to patient /or left at bedside.    PICC Placement Documentation  PICC Single Lumen 05/08/24 Right Basilic 40 cm 0 cm (Active)  Indication for Insertion or Continuance of Line Prolonged intravenous therapies 05/08/24 1759  Exposed Catheter (cm) 0 cm 05/08/24 1759  Site Assessment Clean, Dry, Intact 05/08/24 1759  Line Status Flushed;Blood return noted;Saline locked 05/08/24 1759  Dressing Type Transparent 05/08/24 1759  Dressing Status Antimicrobial disc/dressing in place 05/08/24 1759  Line Care Connections checked and tightened 05/08/24 1759  Line Adjustment (NICU/IV Team Only) No 05/08/24 1759  Dressing Intervention New dressing 05/08/24 1759  Dressing Change Due 05/15/24 05/08/24 1759       Eduardo [name] 05/08/2024, 6:01 PM

## 2024-05-08 NOTE — Progress Notes (Signed)
 Daily Progress Note   Patient Name: [name]       Date: 05/08/2024 DOB: 1955-12-09  Age: 68 y.o. MRN#: [id number] Attending Physician: Lue Elsie BROCKS, MD Primary Care Physician: [name], MD Admit Date: 05/03/2024  Reason for Consultation/Follow-up: Establishing goals of care  Subjective: Awake alert, sitting in chair, no distress. Has had extensive goals of care conversations with various specialists from cardiology, hospital medicine and infectious disease.   Length of Stay: 4  Current Medications: Scheduled Meds:   Chlorhexidine  Gluconate Cloth  6 each Topical Daily   enoxaparin  (LOVENOX ) injection  40 mg Subcutaneous Q24H   feeding supplement (GLUCERNA SHAKE)  237 mL Oral TID BM   insulin  aspart  0-15 Units Subcutaneous TID WC   insulin  aspart  0-5 Units Subcutaneous QHS   midodrine   15 mg Oral TID WC   ondansetron  (ZOFRAN ) IV  4 mg Intravenous Once   pantoprazole  (PROTONIX ) IV  40 mg Intravenous Q24H   tamsulosin   0.4 mg Oral QPC supper   torsemide   30 mg Oral BID    Continuous Infusions:  cefTRIAXone  (ROCEPHIN )  IV      PRN Meds: acetaminophen  **OR** acetaminophen , alum & mag hydroxide-simeth, calcium  carbonate, melatonin, ondansetron  (ZOFRAN ) IV, mouth rinse, polyethylene glycol, simethicone   Physical Exam         Awake alert Sitting in chair Monitor noted 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  Vital Signs: BP 99/65 (BP Location: Left Arm)   Pulse 82   Temp 98.1 F (36.7 C) (Oral)   Resp 20   Ht 5' 9 (1.753 m)   Wt 94.6 kg   SpO2 98%   BMI 30.80 kg/m  SpO2: SpO2: 98 % O2 Device: O2 Device: Room Air O2 Flow Rate: O2 Flow Rate (L/min): 2 L/min  Intake/output summary:  Intake/Output Summary (Last 24 hours) at 05/08/2024 1421 Last data filed at 05/08/2024  1400 Gross per 24 hour  Intake 797.29 ml  Output 1100 ml  Net -302.71 ml   LBM: Last BM Date : 05/07/24 Baseline Weight: Weight: 95.1 kg Most recent weight: Weight: 94.6 kg       Palliative Assessment/Data:      Patient Active Problem List   Diagnosis Date Noted   Severe sepsis (HCC) 05/04/2024   Sepsis (HCC) 05/04/2024   Advanced care planning/counseling  discussion 04/24/2024   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   HFrEF (heart failure with reduced ejection fraction) (HCC) 03/24/2024   Small bowel obstruction (HCC) 11/14/2023   Small bowel obstruction due to adhesions (HCC) 11/14/2023   ICD (implantable cardioverter-defibrillator) in place 02/04/2023   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   Chronic post-operative pain 09/18/2022   Abnormality of gait 09/18/2022   LBBB (left bundle branch block) 08/19/2022   Chronic systolic heart failure (HCC) 08/19/2022   Chronic right-sided congestive heart failure (HCC) 05/18/2022   Ejection fraction < 50% 04/17/2022   Insomnia    Constipation    S/P BKA (below knee amputation), right (HCC) 03/21/2022   Ischemic cardiomyopathy    AKI (acute kidney injury) (HCC) 03/13/2022   Streptococcal bacteremia 03/13/2022   Hypophosphatemia 03/13/2022   Septic shock (HCC) 03/08/2022   Acute systolic heart failure (HCC) 12/28/2020   Acute pulmonary edema (HCC) 10/11/2020   Non-ST elevation (NSTEMI) myocardial infarction (HCC) 10/11/2020   Acute respiratory distress 10/11/2020   Acute on chronic combined systolic and diastolic CHF (congestive heart failure) (HCC) 10/11/2020   Cardiogenic shock (HCC)    Partial nontraumatic amputation of foot, right (HCC) 06/21/2019   Osteomyelitis of right foot (HCC)    Essential hypertension, benign 02/06/2019   Hypercholesteremia 02/06/2019   Stage 3b chronic kidney disease (CKD) (HCC) 10/06/2017   Proteinuria 10/06/2017   Controlled type 2 diabetes mellitus with  hyperglycemia, with long-term current use of insulin  (HCC) 10/06/2017   Coronary artery disease of native artery of native heart with stable angina pectoris (HCC) 05/23/2017   Abnormal nuclear stress test 09/06/2016   PAD (peripheral artery disease) (HCC) 10/28/2012   Morbid obesity with BMI of 40.0-44.9, adult (HCC) 10/13/2012   Diabetes mellitus with ophthalmic complication (HCC) 10/13/2012   Hypertension associated with diabetes (HCC) 10/13/2012   Atherosclerosis of native artery of extremity with intermittent claudication (HCC) 04/05/2012   Adenomatous colon polyp 09/28/2011   Diabetes mellitus (HCC) 09/28/2011   Pure hyperglyceridemia 09/28/2011   Erectile dysfunction 09/28/2011   Diabetic retinopathy (HCC) 09/28/2011    Palliative Care Assessment & Plan   Patient Profile:  Eduardo [name] is a 68 y.o. 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   Assessment:  Will likely need TEE Remains on antibiotics Has oncology appt with Dr Cloretta on 7-3  Recommendations/Plan: Continue current mode of care DNR Goals of care: discussions were held by cardiology and it was felt that TEE and ICD extraction would end up being a high risk procedure with high risk mortality attached to it, ID input was sought, and plan is now to avoid TEE, get PICC line installed and to go home with antibiotics for a certain duration of time alongside home based palliative support.  Offered active listening and supportive presence.  Discussed with RN colleague as well.        Code Status:    Code Status Orders  (From admission, onward)           Start     Ordered   05/04/24 0435  Do not attempt resuscitation (DNR) Pre-Arrest Interventions Desired  Continuous       Question Answer Comment  If pulseless and not breathing  No CPR or chest compressions.   In Pre-Arrest Conditions (Patient Has Pulse and Is Breathing) May intubate, use advanced airway  interventions and cardioversion/ACLS medications if appropriate or indicated. May transfer to ICU.   Consent: Discussion documented in EHR or advanced directives reviewed      05/04/24 0434           Code Status History     Date Active Date Inactive Code Status Order ID Comments User Context   03/28/2024 1226 04/04/2024 2034 Do not attempt resuscitation (DNR) PRE-ARREST INTERVENTIONS DESIRED [id number]  Lee, [place], NP Inpatient   03/24/2024 2147 03/28/2024 1226 Full Code [id number]  Charlott Lue DEL, MD Inpatient   03/16/2024 [phone] 03/17/2024 0517 Do not attempt resuscitation (DNR) - Comfort care [id number]  Jennefer Ester PARAS, MD HOV   03/06/2024 0916 03/07/2024 0516 Full Code [id number]  Johann Sieving, MD HOV   11/14/2023 0720 11/18/2023 1618 Full Code [id number]  Lue Elsie BROCKS, MD Inpatient   11/14/2023 0650 11/14/2023 0720 Full Code [id number]  Kinsinger, Herlene Righter, MD Inpatient   10/21/2022 1500 10/22/2022 0019 Full Code [id number]  Waddell Danelle ORN, MD Inpatient   03/21/2022 1628 03/31/2022 1703 Partial Code [id number]  Rosendo Nena PARAS, PA-C Inpatient   03/21/2022 1628 03/21/2022 1628 Partial Code [id number]  Rosendo Nena PARAS, PA-C Inpatient   03/09/2022 1159 03/21/2022 1625 Partial Code [id number]  Aniceto Daphne CROME, NP Inpatient   03/09/2022 0821 03/09/2022 1159 DNR [id number]  Annella Donnice SAUNDERS, MD Inpatient   03/08/2022 1834 03/09/2022 0821 Full Code [id number]  Alaine Vicenta NOVAK, MD Inpatient   12/28/2020 1325 01/02/2021 1705 DNR [id number]  Elmira Newman PARAS, MD ED   10/14/2020 1343 10/15/2020 1703 DNR [id number]  Lord Levada CROME, PA-C Inpatient   10/11/2020 0719 10/14/2020 1343 Full Code [id number]  Ladona Heinz, MD Inpatient   05/02/2019 0914 05/02/2019 1635 Full Code [id number]  Ladona Heinz, MD Inpatient   02/28/2019 1047 02/28/2019 1710 Full Code [id number]  Ladona Heinz, MD Inpatient        Prognosis:  Unable to determine  Discharge Planning: Recommend outpatient palliative.   Care plan was discussed with patient.   Thank you for allowing the Palliative Medicine Team to assist in the care of this patient. Mod MDM.      Greater than 50%  of this time was spent counseling and coordinating care related to the above assessment and plan.  Lonia Serve, MD  Please contact Palliative Medicine Team phone at [phone] for questions and concerns.

## 2024-05-08 NOTE — Progress Notes (Signed)
 Heart Failure Navigator Progress Note  Assessed for Heart & Vascular TOC clinic readiness.  Patient does not meet criteria due to Advanced Heart Failure team patient of Dr. Gala Romney.   Navigator will sign off at this time.    Rhae Hammock, BSN, Scientist, clinical (histocompatibility and immunogenetics) Only

## 2024-05-08 NOTE — Plan of Care (Signed)
 PMT no charge note Patient awaiting transfer to [facility] for TEE No new inpatient palliative specific recommendations for today. Palliative medicine service will follow hospital course and remains available for further goals of care discussions based on hospital course. Agree with DNR Continue current mode of care PMT to follow along No charge Lonia Serve, MD [facility] health palliative

## 2024-05-08 NOTE — Progress Notes (Addendum)
 PROGRESS NOTE    Eduardo [name]  FMW:[id number] DOB: [birth year] DOA: 05/03/2024 PCP: [name], MD   Brief Narrative:  Eduardo [name] is a 68 y.o. 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  Assessment & Plan:   Principal Problem:   Severe sepsis (HCC) Active Problems:   Sepsis (HCC)   Diabetes mellitus (HCC)   Stage 3b chronic kidney disease (CKD) (HCC)   Essential hypertension, benign   Cholangiocarcinoma (HCC)  Septic shock, resolving Secondary to group B strep bacteremia Meeting sepsis criteria with Tmax 103.3, HR 100s, WBC 12.6  Lactate 3.2-->2.2 requiring pressors to maintain MAP during acute illness  Appreciate infectious disease insight recommendations -continue Unasyn per ID - duration depending on further studies Echo 6/27 - EF 20-25%, LV global hypokinesis, RV enlarged, Aortic calcification/sclerosis without obvious vegetation Discussed case with cardiology, given patient's elevated risk of procedure given his baseline cardiac function and comorbid's it may be prudent to treat patient as presumed endocarditis, would likely not be a candidate for ICD revision or surgery. Repeat cultures per protocol preliminary negative Hold transfer until cardiac evaluation and recommendations  Hypotension, improved Prior history of essential hypertension SBP 80s-100s in setting of sepsis  Borderline hypotension over the last month in clinic, diastolic blood pressure ranging 80-100 Hold BP regimen, heart failure core measures limited by hypotension Increase midodrine  15 TID Remains generally asymptomatic despite hypotension, without tachycardia   Cholangiocarcinoma (HCC) Noted recent diagnosis of cholangiocarcinoma  Not deemed to be a candidate for advanced therapies/surgical intervention  ID concerned  this may be a source of seeding given highly vascular involvement    HFrEF (heart failure with reduced ejection fraction) (HCC) 2D ECHO 03/2024 w/ EF 20-25%, RV moderately reduced  Recently admitted May 16 through 27 Resume torsemide  at half dose, follow clinically, appears euvolemic at this time   Stage 3b chronic kidney disease (CKD) (HCC) Baseline Cr 1.5-1.7 w/ GFR in the 40s  Looks to be near baseline today  Monitor w/ treatment   BPH  Continue tamsulosin   Diabetes mellitus (HCC) Blood sugar in 200s  SSI  A1C   DVT prophylaxis: enoxaparin  (LOVENOX ) injection 40 mg Start: 05/06/24 1200 SCDs Start: 05/04/24 0435 Code Status:   Code Status: Do not attempt resuscitation (DNR) PRE-ARREST INTERVENTIONS DESIRED Family Communication: None present  Status is: Inpatient  Dispo: The patient is from: Home              Anticipated d/c is to: To be determined              Anticipated d/c date is: To be determined              Patient currently not medically stable for discharge  Consultants:  ID, EP, PCCM  Procedures:  None  Antimicrobials:  Unasyn  Subjective: No acute issues or events overnight, feels quite well denies nausea vomiting diarrhea constipation high fevers chills chest pain  Objective: Vitals:   05/08/24 0300 05/08/24 0400 05/08/24 0500 05/08/24 0600  BP: 99/62 107/62    Pulse: 71 81 83 81  Resp: 16 (!) 32 14 (!) 28  Temp:      TempSrc:      SpO2: 95% 92% 92% 92%  Weight:   94.6 kg   Height:        Intake/Output Summary (Last 24 hours) at 05/08/2024  9340 Last data filed at 05/08/2024 0635 Gross per 24 hour  Intake 822.51 ml  Output 850 ml  Net -27.49 ml   Filed Weights   05/06/24 0439 05/07/24 0500 05/08/24 0500  Weight: 95 kg 94.6 kg 94.6 kg    Examination:  General: Pleasantly resting in bed, No acute distress. HEENT: Normocephalic atraumatic.  Sclerae nonicteric, noninjected.  Extraocular movements intact bilaterally. Neck: Without mass or  deformity.  Trachea is midline. Lungs: Clear to auscultate bilaterally without rhonchi, wheeze, or rales. Heart: Regular rate and rhythm.  Without murmurs, rubs, or gallops. Abdomen: Soft, nontender, nondistended.  Without guarding or rebound. Extremities: Without cyanosis, clubbing, edema, Right BKA Skin: Warm and dry, no erythema.  Data Reviewed: I have personally reviewed following labs and imaging studies  CBC: Recent Labs  Lab 05/03/24 2338 05/04/24 0513 05/07/24 0521  WBC 12.6* 14.9* 6.9  NEUTROABS 11.7* 13.6*  --   HGB 14.0 11.9* 12.5*  HCT 44.8 37.7* 40.4  MCV 90.5 88.9 93.3  PLT 222 162 157   Basic Metabolic Panel: Recent Labs  Lab 05/03/24 2338 05/04/24 0513 05/07/24 0521  NA 139 138 135  K 4.4 3.6 4.0  CL 99 98 100  CO2 23 25 23   GLUCOSE 272* 212* 132*  BUN 43* 45* 57*  CREATININE 1.64* 1.71* 1.94*  CALCIUM  9.4 9.0 8.6*  MG  --  2.1  --    GFR: Estimated Creatinine Clearance: 41.4 mL/min (A) (by C-G formula based on SCr of 1.94 mg/dL (H)). Liver Function Tests: Recent Labs  Lab 05/03/24 2338 05/04/24 0513  AST 34 36  ALT 16 17  ALKPHOS 118 100  BILITOT 1.3* 1.5*  PROT 6.5 5.8*  ALBUMIN  3.5 3.2*   No results for input(s): LIPASE, AMYLASE in the last 168 hours. No results for input(s): AMMONIA in the last 168 hours. Coagulation Profile: Recent Labs  Lab 05/03/24 2338 05/04/24 0513  INR 1.2 1.3*   Cardiac Enzymes: No results for input(s): CKTOTAL, CKMB, CKMBINDEX, TROPONINI in the last 168 hours. BNP (last 3 results) No results for input(s): PROBNP in the last 8760 hours. HbA1C: No results for input(s): HGBA1C in the last 72 hours. CBG: Recent Labs  Lab 05/06/24 2131 05/07/24 0749 05/07/24 1116 05/07/24 1606 05/07/24 2145  GLUCAP 183* 115* 156* 299* 211*   Lipid Profile: No results for input(s): CHOL, HDL, LDLCALC, TRIG, CHOLHDL, LDLDIRECT in the last 72 hours. Thyroid  Function Tests: No results for  input(s): TSH, T4TOTAL, FREET4, T3FREE, THYROIDAB in the last 72 hours. Anemia Panel: No results for input(s): VITAMINB12, FOLATE, FERRITIN, TIBC, IRON , RETICCTPCT in the last 72 hours. Sepsis Labs: Recent Labs  Lab 05/04/24 0047 05/04/24 0139 05/04/24 0513 05/04/24 1045  PROCALCITON  --   --  13.47  --   LATICACIDVEN 3.2* 2.3*  --  1.8    Recent Results (from the past 240 hours)  Resp panel by RT-PCR (RSV, Flu A&B, Covid) Anterior Nasal Swab     Status: None   Collection Time: 05/03/24 11:38 PM   Specimen: Anterior Nasal Swab  Result Value Ref Range Status   SARS Coronavirus 2 by RT PCR NEGATIVE NEGATIVE Final    Comment: (NOTE) SARS-CoV-2 target nucleic acids are NOT DETECTED.  The SARS-CoV-2 RNA is generally detectable in upper respiratory specimens during the acute phase of infection. The lowest concentration of SARS-CoV-2 viral copies this assay can detect is 138 copies/mL. A negative result does not preclude SARS-Cov-2 infection and should not be used as the  sole basis for treatment or other patient management decisions. A negative result may occur with  improper specimen collection/handling, submission of specimen other than nasopharyngeal swab, presence of viral mutation(s) within the areas targeted by this assay, and inadequate number of viral copies(<138 copies/mL). A negative result must be combined with clinical observations, patient history, and epidemiological information. The expected result is Negative.  Fact Sheet for Patients:  BloggerCourse.com  Fact Sheet for Healthcare Providers:  SeriousBroker.it  This test is no t yet approved or cleared by the [place]  FDA and  has been authorized for detection and/or diagnosis of SARS-CoV-2 by FDA under an Emergency Use Authorization (EUA). This EUA will remain  in effect (meaning this test can be used) for the duration of the COVID-19  declaration under Section 564(b)(1) of the Act, 21 U.S.C.section 360bbb-3(b)(1), unless the authorization is terminated  or revoked sooner.       Influenza A by PCR NEGATIVE NEGATIVE Final   Influenza B by PCR NEGATIVE NEGATIVE Final    Comment: (NOTE) The Xpert Xpress SARS-CoV-2/FLU/RSV plus assay is intended as an aid in the diagnosis of influenza from Nasopharyngeal swab specimens and should not be used as a sole basis for treatment. Nasal washings and aspirates are unacceptable for Xpert Xpress SARS-CoV-2/FLU/RSV testing.  Fact Sheet for Patients: BloggerCourse.com  Fact Sheet for Healthcare Providers: SeriousBroker.it  This test is not yet approved or cleared by the [place]  FDA and has been authorized for detection and/or diagnosis of SARS-CoV-2 by FDA under an Emergency Use Authorization (EUA). This EUA will remain in effect (meaning this test can be used) for the duration of the COVID-19 declaration under Section 564(b)(1) of the Act, 21 U.S.C. section 360bbb-3(b)(1), unless the authorization is terminated or revoked.     Resp Syncytial Virus by PCR NEGATIVE NEGATIVE Final    Comment: (NOTE) Fact Sheet for Patients: BloggerCourse.com  Fact Sheet for Healthcare Providers: SeriousBroker.it  This test is not yet approved or cleared by the [place]  FDA and has been authorized for detection and/or diagnosis of SARS-CoV-2 by FDA under an Emergency Use Authorization (EUA). This EUA will remain in effect (meaning this test can be used) for the duration of the COVID-19 declaration under Section 564(b)(1) of the Act, 21 U.S.C. section 360bbb-3(b)(1), unless the authorization is terminated or revoked.  Performed at [facility], [address], [place], [place] [address]   Culture, blood (Routine x 2)     Status: Abnormal (Preliminary result)    Collection Time: 05/04/24 12:30 AM   Specimen: BLOOD  Result Value Ref Range Status   Specimen Description   Final    BLOOD LEFT ANTECUBITAL Performed at [facility], [address], [place], [place] [address]    Special Requests   Final    BOTTLES DRAWN AEROBIC AND ANAEROBIC Blood Culture adequate volume Performed at [facility], [address] [place], [place] [address]    Culture  Setup Time   Final    GRAM POSITIVE COCCI IN BOTH AEROBIC AND ANAEROBIC BOTTLES CRITICAL RESULT CALLED TO, READ BACK BY AND VERIFIED WITH: PHARMD E JACKSON 05/05/2024 @ 0255 BY AB    Culture (A)  Final    GROUP B STREP(S.AGALACTIAE)ISOLATED SUSCEPTIBILITIES TO FOLLOW Performed at [facility], [address] [place], [place] [address]    Report Status PENDING  Incomplete  Blood Culture ID Panel (Reflexed)     Status: Abnormal   Collection Time: 05/04/24 12:30 AM  Result Value Ref Range Status   Enterococcus faecalis NOT DETECTED NOT DETECTED Final   Enterococcus Faecium NOT DETECTED NOT DETECTED Final   Listeria monocytogenes NOT DETECTED NOT DETECTED Final   Staphylococcus species NOT DETECTED NOT DETECTED Final   Staphylococcus aureus (BCID) NOT DETECTED NOT DETECTED Final   Staphylococcus epidermidis NOT DETECTED NOT DETECTED Final   Staphylococcus lugdunensis NOT DETECTED NOT DETECTED Final   Streptococcus species DETECTED (A) NOT DETECTED Final    Comment: CRITICAL RESULT CALLED TO, READ BACK BY AND VERIFIED WITH: PHARMD E JACKSON 05/05/2024 @ 0255 BY AB    Streptococcus agalactiae DETECTED (A) NOT DETECTED Final    Comment: CRITICAL RESULT CALLED TO, READ BACK BY AND VERIFIED WITH: PHARMD E JACKSON 05/05/2024 @ 0255 BY AB    Streptococcus pneumoniae NOT DETECTED NOT DETECTED Final   Streptococcus pyogenes NOT DETECTED NOT DETECTED Final   A.calcoaceticus-baumannii NOT DETECTED NOT DETECTED Final   Bacteroides fragilis NOT DETECTED NOT DETECTED  Final   Enterobacterales NOT DETECTED NOT DETECTED Final   Enterobacter cloacae complex NOT DETECTED NOT DETECTED Final   Escherichia coli NOT DETECTED NOT DETECTED Final   Klebsiella aerogenes NOT DETECTED NOT DETECTED Final   Klebsiella oxytoca NOT DETECTED NOT DETECTED Final   Klebsiella pneumoniae NOT DETECTED NOT DETECTED Final   Proteus species NOT DETECTED NOT DETECTED Final   Salmonella species NOT DETECTED NOT DETECTED Final   Serratia marcescens NOT DETECTED NOT DETECTED Final   Haemophilus influenzae NOT DETECTED NOT DETECTED Final   Neisseria meningitidis NOT DETECTED NOT DETECTED Final   Pseudomonas aeruginosa NOT DETECTED NOT DETECTED Final   Stenotrophomonas maltophilia NOT DETECTED NOT DETECTED Final   Candida albicans NOT DETECTED NOT DETECTED Final   Candida auris NOT DETECTED NOT DETECTED Final   Candida glabrata NOT DETECTED NOT DETECTED Final   Candida krusei NOT DETECTED NOT DETECTED Final   Candida parapsilosis NOT DETECTED NOT DETECTED Final   Candida tropicalis NOT DETECTED NOT DETECTED Final   Cryptococcus neoformans/gattii NOT DETECTED NOT DETECTED Final    Comment: Performed at [facility], [address] [place], [place] [address]  MRSA Next Gen by PCR, Nasal     Status: None   Collection Time: 05/04/24  9:53 AM   Specimen: Nasal Mucosa; Nasal Swab  Result Value Ref Range Status   MRSA by PCR Next Gen NOT DETECTED NOT DETECTED Final    Comment: (NOTE) The GeneXpert MRSA Assay (FDA approved for NASAL specimens only), is one component of a comprehensive MRSA colonization surveillance program. It is not intended to diagnose MRSA infection nor to guide or monitor treatment for MRSA infections. Test performance is not FDA approved in patients less than 44 years old. Performed at [facility], [address], [place], [place] [address]   Culture, blood (Routine x 2)     Status: None (Preliminary result)   Collection Time: 05/04/24  10:45 AM   Specimen: BLOOD LEFT HAND  Result Value Ref Range Status   Specimen Description   Final    BLOOD LEFT HAND Performed at [facility], [address] [place], [place] [address]    Special Requests   Final    BOTTLES DRAWN AEROBIC AND ANAEROBIC Blood Culture results may not be optimal due to an inadequate volume of blood received in culture bottles Performed at [facility], [address], [place], [place] [address]    Culture   Final    NO GROWTH  4 DAYS Performed at [facility], [address] [place], [place] [address]    Report Status PENDING  Incomplete  Respiratory (~20 pathogens) panel by PCR     Status: None   Collection Time: 05/04/24  3:09 PM   Specimen: Nasopharyngeal Swab; Respiratory  Result Value Ref Range Status   Adenovirus NOT DETECTED NOT DETECTED Final   Coronavirus 229E NOT DETECTED NOT DETECTED Final    Comment: (NOTE) The Coronavirus on the Respiratory Panel, DOES NOT test for the novel  Coronavirus (2019 nCoV)    Coronavirus HKU1 NOT DETECTED NOT DETECTED Final   Coronavirus NL63 NOT DETECTED NOT DETECTED Final   Coronavirus OC43 NOT DETECTED NOT DETECTED Final   Metapneumovirus NOT DETECTED NOT DETECTED Final   Rhinovirus / Enterovirus NOT DETECTED NOT DETECTED Final   Influenza A NOT DETECTED NOT DETECTED Final   Influenza B NOT DETECTED NOT DETECTED Final   Parainfluenza Virus 1 NOT DETECTED NOT DETECTED Final   Parainfluenza Virus 2 NOT DETECTED NOT DETECTED Final   Parainfluenza Virus 3 NOT DETECTED NOT DETECTED Final   Parainfluenza Virus 4 NOT DETECTED NOT DETECTED Final   Respiratory Syncytial Virus NOT DETECTED NOT DETECTED Final   Bordetella pertussis NOT DETECTED NOT DETECTED Final   Bordetella Parapertussis NOT DETECTED NOT DETECTED Final   Chlamydophila pneumoniae NOT DETECTED NOT DETECTED Final   Mycoplasma pneumoniae NOT DETECTED NOT DETECTED Final    Comment: Performed at [facility], [address], [place], [place] [address]  Culture, blood (Routine X 2) w Reflex to ID Panel     Status: None (Preliminary result)   Collection Time: 05/05/24  1:10 PM   Specimen: BLOOD RIGHT HAND  Result Value Ref Range Status   Specimen Description   Final    BLOOD RIGHT HAND Performed at [facility], [address], [place], [place] [address]    Special Requests   Final    BOTTLES DRAWN AEROBIC AND ANAEROBIC Blood Culture results may not be optimal due to an inadequate volume of blood received in culture bottles Performed at [facility], [address], [place], [place] [address]    Culture   Final    NO GROWTH 3 DAYS Performed at [facility], [address], [place], [place] [address]    Report Status PENDING  Incomplete  Culture, blood (Routine X 2) w Reflex to ID Panel     Status: None (Preliminary result)   Collection Time: 05/05/24  1:10 PM   Specimen: BLOOD LEFT HAND  Result Value Ref Range Status   Specimen Description   Final    BLOOD LEFT HAND Performed at [facility], [address] [place], [place] [address]    Special Requests   Final    BOTTLES DRAWN AEROBIC ONLY Blood Culture results may not be optimal due to an inadequate volume of blood received in culture bottles Performed at [facility], [address], [place], [place] [address]    Culture   Final    NO GROWTH 3 DAYS Performed at [facility], [address], [place], [place] [address]    Report Status PENDING  Incomplete         Radiology Studies: No results found.  Scheduled Meds:  Chlorhexidine  Gluconate Cloth  6 each Topical Daily   enoxaparin  (LOVENOX ) injection  40 mg Subcutaneous Q24H   feeding supplement (GLUCERNA SHAKE)  237 mL Oral  TID BM   insulin  aspart  0-15 Units Subcutaneous TID WC   insulin  aspart  0-5 Units Subcutaneous QHS   midodrine   15 mg Oral TID WC   ondansetron  (ZOFRAN ) IV  4 mg Intravenous Once    pantoprazole  (PROTONIX ) IV  40 mg Intravenous Q24H   tamsulosin   0.4 mg Oral QPC supper   torsemide   30 mg Oral BID   Continuous Infusions:  ampicillin-sulbactam (UNASYN) IV Stopped (05/08/24 9366)     LOS: 4 days   Time spent:  Elsie JAYSON [place], DO [facility]  If 7PM-7AM, please contact night-coverage [email or web address]  05/08/2024, 6:59 AM

## 2024-05-08 NOTE — Consult Note (Addendum)
 Cardiology Consultation   Patient ID: Eduardo [name] MRN: [id number]; DOB: Jun 01, 1956  Admit date: 05/03/2024 Date of Consult: 05/08/2024  PCP:  [name], MD   [facility] Providers Cardiologist:  Newman JINNY Lawrence, MD  Electrophysiologist:  Danelle Birmingham, MD    Cc: generalized malaise, intermittent cough,  and fatigue.  Reason of consultation: Bacteremia and Biventricular heart failure.   Patient Profile: Eduardo [name] is a 68 y.o. 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  History of Present Illness: Eduardo [name] with above complex past medical history presented to the ER on 05/04/2024 for 2 days onset of generalized malaise, intermittent cough, generalized weakness, and fatigue.  He was found febrile, tachycardic, hypotensive at admission.  Lab work revealed lactic acidosis, leukocytosis initially.  Chest x-ray revealed some atelectasis.  He was admitted for sepsis and started on broad spectrum IV antibiotic.  Subsequent lab revealed group B strep bacteremia.  He required ICU admission for vasopressor support due to septic shock.  He was seen by ID, felt source of bacteremia is likely hematogenous spread from cholangiocarcinoma mass.  CT chest abdomen and pelvis without contrast from 05/04/2024 showed moderate to large left and small to moderate right pleural effusion, associated compressive atelectasis, no lung mass/consolidation/pneumothorax. Redemonstration of ill-defined, heterogeneous  hypoattenuating approximately 9.4 x 12.6 cm mass in the right hepatic lobe, which appears increased in size since the prior study.  Small to moderate ascites appears new.  He remained DNR in status, hospice consulted and recommended hospice support /palliative care in the outpatient setting. Echocardiogram repeated 05/05/2024 showed LVEF 20 to 25%, global hypokinesis, severe LV dilatation, indeterminate diastolic parameter, severely reduced RV, severe enlarged RV, RVSP 39.8 mmHg, moderate LAE, moderate MR, aortic sclerosis, IVC dilated.  His antibiotic has been de-escalated to IV Unasyn per ID recommendation.  TEE and EP consult are planned by primary team, with pending transfer to [facility].  Official cardiology consult is requested today for further evaluation  Upon encounter, patient is sitting in the chair. He has overall good understanding of his multiple health issues. He was under the impression that his heart failure is advanced and there was not a lot can be offered by his cardiologist. He was referred to palliative care but has not gotten any call about this. He is waiting to see his oncologist 05/11/24, was told the option is a pill to potentially shrink the mass and potentially getting local radiation. He states the only thing works is my ICD and questioning if this should be removed at all. He states he just simply wants to go home and feels ok to die at home. He states he is not getting his home dose diuretics and leg edema is bit worse than usual. He is not having chest pain at this time. He felt his SOB is overall stable, is still urinating like usual.    Per extensive chart review:  He has severe 3 vessel CAD (LAD, OM1, and RCA), last cath 10/11/20 revealed no good target for revascularization and he was felt high  risk for mortality given overall comorbidity and not a good surgical candidate for revascularization due to small caliber and diffuse nature of the disease. He suffers chronic  systolic heart failure due to ischemic cardiomyopathy. Echo from 10/2020 with LVEF 20-25%, grade I DD, + WMA. He was hospitalized 12/2020 for CHF. Echo from 12/2020 with LVEF <20%. He underwent unsuccessful attempt at proximal LAD CTO intervention in spite of use of support catheters and multiple wires by Dr Ladona on 12/31/2020. He was recommended GDMT for CAD and CHF. He has been maintained on ASA, Plavix , crestor , zetia , entresto , and torsemide  historically. GDMT limited by CKD and BP at times. Echo repeat 03/16/22 showed LVEF 30-35%, + WMA, normal RV, mild to mod MR. TEE 03/18/22 for bacteremia showed LVEF 20-25%, no LA/LAA thrombus, mild to mod MR, no vegetation. He was referred to EP and underwent BIV CRT-D insertion by Dr Waddell on 10/21/22. He was last seen by EP 03/03/2024, reports volume overload, CRT-D interrogation revealing normal ICD function, no treated arrhythmia, BiV pacing 95% of the time, estimated longevity 5 years a month. Most recent interrogation of ICD from 04/20/24 showed Normal device function, AS/ BiVP 915, no episodes.   He was last seen by Dr. Elmira 03/21/2024 in the office, noted with some severe swelling of legs up to scrotum, BiV ICD interrogation revealed moderate volume overload, he was increased diuretic orally at home without much improvement.  He was recommended direct hospitalization.  He had a poor response despite escalating of IV Lasix .  PICC line was inserted and revealed borderline coox.  Diuresis was limited by hypotension.  He was seen by advanced heart failure team.  Felt to have stage D cardiomyopathy.  Echocardiogram repeated on 03/26/2024 showed LVEF 2025%, + WMA, severe LV dilatation, moderate asymmetric LVH, indeterminate diastolic parameter, new onset of severely reduced RV systolic function, mild LAE, large pleural effusion in the left lateral region, mild MR. Subsequent right heart catheterization 03/29/2024 revealed elevated filling pressure and reduced cardiac index  with PCWP of 29 and reduced TD CI of 1.8 .  He required dual inotropes DBA and NE support while diuresed through IV Lasix  drip.  Of note he was offered VAD in 2022 and did not want to pursue.  Given newly diagnosed cholangiocarcinoma, he was felt no longer a candidate for advanced therapy.  He was also seen by oncology and felt not a surgical candidate, was offered possible hepatic embolization and systemic therapy pending liver biopsy and PET scan.  GDMT had been limited due to renal function and hypotension.  He was ultimately discharged on 04/04/2024 with midodrine  15 mg 3 times daily, torsemide  40 mg daily, PTA Bumex  and Entresto  were stopped.  He was continued on amiodarone  200 mg twice daily for NSVT.  He followed up in the office on 04/24/2024, taking Lasix  60 mg twice daily, GDMT continue to be limited by hypotension, diuresis affect was felt suboptimal, metolazone  2.5 mg every other day was considered if renal function remains stable, he was recommended palliative care consult.    Past Medical History:  Diagnosis Date   CHF (congestive heart failure) (HCC)    Chronic kidney disease    stage 3   Colon polyp    Coronary artery disease    Diabetes mellitus 1987   under care of Dr. Tommas.  On insulin  since 96 (off and on)   Diabetic retinopathy    Dupuytren contracture    R hand, s/p injection (Dr. Lorretta)   Essential hypertension,  benign    Essential hypertension, benign 02/06/2019   Frequency of urination and polyuria    Hypertension    Myocardial infarction [facility])    denies   Neuromuscular disorder (HCC)    Diabetic neuropathy   Osteomyelitis (HCC)    right foot   Other testicular hypofunction    Peripheral arterial disease (HCC) 10/28/2012   Peritoneal abscess (HCC) 6/08   and buttock.   Pneumonia    Polydipsia    Proteinuria    Pure hyperglyceridemia    Subacute osteomyelitis, right ankle and foot (HCC)    Wears glasses     Past Surgical History:  Procedure Laterality Date    ABDOMINAL AORTAGRAM N/A 04/18/2012   Procedure: ABDOMINAL EZELLA;  Surgeon: Lonni GORMAN Blade, MD;  Location: [facility] CATH LAB;  Service: Cardiovascular;  Laterality: N/A;   AMPUTATION Right 05/19/2019   Procedure: RIGHT FOOT 5TH RAY AMPUTATION;  Surgeon: Harden Jerona GAILS, MD;  Location: [facility] OR;  Service: Orthopedics;  Laterality: Right;   AMPUTATION Right 03/11/2022   Procedure: RIGHT LEG DEBRIDEMENT VS. BELOW KNEE AMPUTATION;  Surgeon: Harden Jerona GAILS, MD;  Location: [facility] OR;  Service: Orthopedics;  Laterality: Right;   BIV ICD INSERTION CRT-D N/A 10/21/2022   Procedure: BIV ICD INSERTION CRT-D;  Surgeon: Waddell Danelle ORN, MD;  Location: [facility] INVASIVE CV LAB;  Service: Cardiovascular;  Laterality: N/A;   BOWEL RESECTION N/A 11/14/2023   Procedure: SMALL BOWEL RESECTION;  Surgeon: Stevie, Herlene Righter, MD;  Location: [facility] OR;  Service: General;  Laterality: N/A;   CARDIAC CATHETERIZATION N/A 09/08/2016   Procedure: Left Heart Cath and Coronary Angiography;  Surgeon: Gordy Bergamo, MD;  Location: [facility] INVASIVE CV LAB;  Service: Cardiovascular;  Laterality: N/A;   CATARACT EXTRACTION, BILATERAL  09/2017, 10/2017   Dr. Cleatus   COLONOSCOPY W/ BIOPSIES AND POLYPECTOMY     CORONARY/GRAFT ACUTE MI REVASCULARIZATION N/A 10/11/2020   Procedure: Coronary/Graft Acute MI Revascularization;  Surgeon: Bergamo Gordy, MD;  Location: [facility] INVASIVE CV LAB;  Service: Cardiovascular;  Laterality: N/A;   I & D EXTREMITY Right 03/11/2022   Procedure: BELOW KNEE AMPUTATION;  Surgeon: Harden Jerona GAILS, MD;  Location: [facility] OR;  Service: Orthopedics;  Laterality: Right;   INSERT / REPLACE / REMOVE PACEMAKER     LAPAROSCOPY N/A 11/14/2023   Procedure: LAPAROSCOPY DIAGNOSTIC;  Surgeon: Stevie, Herlene Righter, MD;  Location: [facility] OR;  Service: General;  Laterality: N/A;  Converted to open ex-lap after inserting first trocar   LAPAROTOMY N/A 11/14/2023   Procedure: EXPLORATORY LAPAROTOMY;  Surgeon: Stevie Herlene Righter, MD;  Location: [facility] OR;   Service: General;  Laterality: N/A;   LEFT HEART CATH N/A 12/31/2020   Procedure: Left Heart Cath;  Surgeon: Bergamo Gordy, MD;  Location: [facility] INVASIVE CV LAB;  Service: Cardiovascular;  Laterality: N/A;   LEFT HEART CATH AND CORONARY ANGIOGRAPHY N/A 10/11/2020   Procedure: LEFT HEART CATH AND CORONARY ANGIOGRAPHY;  Surgeon: Bergamo Gordy, MD;  Location: [facility] INVASIVE CV LAB;  Service: Cardiovascular;  Laterality: N/A;   LOWER EXTREMITY ANGIOGRAM Bilateral 04/18/2012   Procedure: LOWER EXTREMITY ANGIOGRAM;  Surgeon: Lonni GORMAN Blade, MD;  Location: [facility] CATH LAB;  Service: Cardiovascular;  Laterality: Bilateral;  bilat lower extrem angio   LOWER EXTREMITY ANGIOGRAPHY Bilateral 05/02/2019   Procedure: LOWER EXTREMITY ANGIOGRAPHY;  Surgeon: Bergamo Gordy, MD;  Location: [facility] INVASIVE CV LAB;  Service: Cardiovascular;  Laterality: Bilateral;   LOWER EXTREMITY ANGIOGRAPHY Bilateral 02/28/2019   Procedure: LOWER EXTREMITY ANGIOGRAPHY;  Surgeon: Bergamo Gordy, MD;  Location: [facility] INVASIVE CV LAB;  Service: Cardiovascular;  Laterality: Bilateral;   macular photocoagulation     (eye treatments for diabetic retinopathy)-[name]   PERIPHERAL VASCULAR INTERVENTION  02/28/2019   Procedure: PERIPHERAL VASCULAR INTERVENTION;  Surgeon: Ladona Heinz, MD;  Location: [facility] INVASIVE CV LAB;  Service: Cardiovascular;;   RIGHT HEART CATH N/A 03/29/2024   Procedure: RIGHT HEART CATH;  Surgeon: Zenaida Morene PARAS, MD;  Location: [facility] INVASIVE CV LAB;  Service: Cardiovascular;  Laterality: N/A;   TEE WITHOUT CARDIOVERSION N/A 03/18/2022   Procedure: TRANSESOPHAGEAL ECHOCARDIOGRAM (TEE);  Surgeon: Ladona Heinz, MD;  Location: [facility] ENDOSCOPY;  Service: Cardiovascular;  Laterality: N/A;   VENTRICULAR ASSIST DEVICE INSERTION N/A 12/31/2020   Procedure: VENTRICULAR ASSIST DEVICE INSERTION;  Surgeon: Ladona Heinz, MD;  Location: [facility] INVASIVE CV LAB;  Service: Cardiovascular;  Laterality: N/A;     Home Medications:  Prior to Admission medications    Medication Sig Start Date End Date Taking? Authorizing Provider  amiodarone  (PACERONE ) 200 MG tablet Take 1 tablet (200 mg total) by mouth 2 (two) times daily for 7 days, THEN 1 tablet (200 mg total) daily thereafter 04/04/24 06/01/24 Yes Clegg, Amy D, NP  Bioflavonoid Products (ESTER C PO) Take 1,000 mg by mouth daily.   Yes [provider]  Cholecalciferol  (VITAMIN D ) 2000 units CAPS Take 2,000 Units by mouth daily.   Yes [provider]  clopidogrel  (PLAVIX ) 75 MG tablet TAKE 1 TABLET BY MOUTH DAILY 04/21/24  Yes Patwardhan, Manish J, MD  ezetimibe  (ZETIA ) 10 MG tablet TAKE 1 TABLET BY MOUTH DAILY 04/21/24  Yes Patwardhan, Manish J, MD  famotidine  (PEPCID ) 10 MG tablet Take 10 mg by mouth 2 (two) times daily as needed for heartburn or indigestion.   Yes [provider]  gemfibrozil  (LOPID ) 600 MG tablet Take 600 mg by mouth 2 (two) times daily before a meal.   Yes [provider]  HUMALOG KWIKPEN 100 UNIT/ML KiwkPen Inject 3-10 Units into the skin 3 (three) times daily. Inject 01-1009 units into the skin three times a day, per sliding scale- based on BGL >100 08/02/16  Yes [provider]  HUMULIN  N KWIKPEN 100 UNIT/ML KwikPen Inject 6 Units into the skin daily. If BS is under 150 do not take per provider 03/20/24  Yes [provider]  Anselm Oil 300 MG CAPS Take 500 mg by mouth daily.   Yes [provider]  lidocaine  (XYLOCAINE ) 5 % ointment Apply 1 Application topically as needed. For pain as needed- can use up to 4x/day 01/03/24  Yes Lovorn, Megan, MD  melatonin 10 MG TABS Take 10 mg by mouth at bedtime. 03/31/22  Yes Setzer, Sandra J, PA-C  midodrine  (PROAMATINE ) 5 MG tablet Take 3 tablets (15 mg total) by mouth 3 (three) times daily with meals. 04/04/24  Yes Clegg, Amy D, NP  Multiple Vitamin (MULTIVITAMIN WITH MINERALS) TABS tablet Take 1 tablet by mouth daily.   Yes [provider]  nitroGLYCERIN  (NITROSTAT ) 0.4 MG SL tablet  Place 1 tablet (0.4 mg total) under the tongue every 5 (five) minutes as needed for chest pain. 05/08/21  Yes Cantwell, Celeste C, PA-C  potassium chloride  SA (KLOR-CON  M) 20 MEQ tablet Take 2 tablets (40 mEq total) by mouth daily. Patient taking differently: Take 20 mEq by mouth 2 (two) times daily. 04/18/24  Yes Colletta Manuelita Garre, PA-C  Probiotic Product (PROBIOTIC-10 PO) Take 1 capsule by mouth daily.   Yes [provider]  rosuvastatin  (CRESTOR ) 20 MG  tablet Take 1 tablet (20 mg total) by mouth daily. 07/22/20  Yes Ladona Heinz, MD  sodium chloride  (OCEAN) 0.65 % SOLN nasal spray Place 1 spray into both nostrils as needed for congestion.   Yes [provider]  tamsulosin  (FLOMAX ) 0.4 MG CAPS capsule Take 1 capsule (0.4 mg total) by mouth daily after supper. 04/04/24  Yes Clegg, Amy D, NP  torsemide  (DEMADEX ) 20 MG tablet Take 3 tablets (60 mg total) by mouth 2 (two) times daily. 04/18/24  Yes Colletta Manuelita Garre, PA-C  amiodarone  (PACERONE ) 200 MG tablet Take 200 mg by mouth daily.    [provider]  Continuous Glucose Sensor (FREESTYLE LIBRE 2 PLUS SENSOR) MISC 1 Application. 03/20/24   [provider]    Scheduled Meds:  Chlorhexidine  Gluconate Cloth  6 each Topical Daily   enoxaparin  (LOVENOX ) injection  40 mg Subcutaneous Q24H   feeding supplement (GLUCERNA SHAKE)  237 mL Oral TID BM   insulin  aspart  0-15 Units Subcutaneous TID WC   insulin  aspart  0-5 Units Subcutaneous QHS   midodrine   15 mg Oral TID WC   ondansetron  (ZOFRAN ) IV  4 mg Intravenous Once   pantoprazole  (PROTONIX ) IV  40 mg Intravenous Q24H   tamsulosin   0.4 mg Oral QPC supper   torsemide   30 mg Oral BID   Continuous Infusions:  ampicillin-sulbactam (UNASYN) IV 3 g (05/08/24 0950)   PRN Meds: acetaminophen  **OR** acetaminophen , alum & mag hydroxide-simeth, calcium  carbonate, melatonin, ondansetron  (ZOFRAN ) IV, mouth rinse, polyethylene glycol, simethicone   Allergies:     Allergies  Allergen Reactions   Empagliflozin      Other Reaction(s): foot infection   Shellfish-Derived Products Nausea And Vomiting and Other (See Comments)    Only Mussels cause severe nausea and vomiting    Latex Rash   Tape Rash and Other (See Comments)    Caused issues with the skin   Testosterone  Rash    did not feel well     Social History:   Social History   Socioeconomic History   Marital status: Widowed    Spouse name: Not on file   Number of children: 0   Years of education: Not on file   Highest education level: Not on file  Occupational History   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 Retired. 1 cat.  Girlfriend and her daughter lives with him. Enjoys fishing.   Updated 06/2021   Social Drivers of Health   Financial Resource Strain: Low Risk  (07/28/2023)   Overall Financial Resource Strain (CARDIA)    Difficulty of Paying Living Expenses: Not hard at all  Food Insecurity: No Food Insecurity (05/05/2024)   Hunger Vital Sign    Worried About Running Out of Food in the Last Year:  Never true    Ran Out of Food in the Last Year: Never true  Transportation Needs: No Transportation Needs (05/05/2024)   PRAPARE - Administrator, Civil Service (Medical): No    Lack of Transportation (Non-Medical): No  Physical Activity: Sufficiently Active  (07/28/2023)   Exercise Vital Sign    Days of Exercise per Week: 7 days    Minutes of Exercise per Session: 90 min  Stress: No Stress Concern Present (07/28/2023)   [employer] of Occupational Health - Occupational Stress Questionnaire    Feeling of Stress : Not at all  Social Connections: Socially Isolated (05/05/2024)   Social Connection and Isolation Panel    Frequency of Communication with Friends and Family: Never    Frequency of Social Gatherings with Friends and Family: Never    Attends Religious Services: Never    Database administrator or Organizations: No    Attends Banker Meetings: Never    Marital Status: Living with partner  Intimate Partner Violence: Not At Risk (05/05/2024)   Humiliation, Afraid, Rape, and Kick questionnaire    Fear of Current or Ex-Partner: No    Emotionally Abused: No    Physically Abused: No    Sexually Abused: No    Family History:    Family History  Problem Relation Age of Onset   Diabetes Mother    Hearing loss Mother    Hypertension Mother    Hyperlipidemia Mother    Heart disease Mother    Varicose Veins Mother    Varicose Veins Father    Dementia Father    Hyperlipidemia Brother    Diabetes Maternal Grandmother      ROS:  Review of Systems  Constitutional: Positive for malaise/fatigue (POA).  Cardiovascular:  Positive for dyspnea on exertion (chronic and stable) and leg swelling. Negative for chest pain, claudication, irregular heartbeat, near-syncope, orthopnea, palpitations, paroxysmal nocturnal dyspnea and syncope.  Respiratory:  Positive for cough. Negative for shortness of breath.   Hematologic/Lymphatic: Negative for bleeding problem.    Physical Exam/Data: Vitals:   05/08/24 0920 05/08/24 0930 05/08/24 0940 05/08/24 0950  BP:   (!) 93/59   Pulse: 79 86 88 86  Resp: (!) 23 (!) 30 (!) 33 (!) 27  Temp:      TempSrc:      SpO2: (!) 88% 96% 97% 98%  Weight:      Height:        Intake/Output Summary  (Last 24 hours) at 05/08/2024 1010 Last data filed at 05/08/2024 0635 Gross per 24 hour  Intake 635.71 ml  Output 525 ml  Net 110.71 ml      05/08/2024    5:00 AM 05/07/2024    5:00 AM 05/06/2024    4:39 AM  Last 3 Weights  Weight (lbs) 208 lb 8.9 oz 208 lb 8.9 oz 209 lb 7 oz  Weight (kg) 94.6 kg 94.6 kg 95 kg     Body mass index is 30.8 kg/m.   Vitals:  Vitals:   05/08/24 0940 05/08/24 0950  BP: (!) 93/59   Pulse: 88 86  Resp: (!) 33 (!) 27  Temp:    SpO2: 97% 98%   General Appearance: In no apparent distress, sitting in chair  HEENT: Normocephalic, atraumatic.  Neck: Supple, trachea midline, no JVDs Cardiovascular: Regular rate and rhythm, normal S1-S2,  no murmur Respiratory: Resting breathing unlabored, lungs sounds with bilateral rales at base, on room air, speaks at ease  Gastrointestinal: Bowel  sounds positive, abdomen with mild distention  Extremities: Able to move all extremities in bed without difficulty, s/p right BKA, prothesis in place, LLE 2+ pitting edema  Genitourinary: Urine amber color in urinal  Musculoskeletal: Normal muscle bulk and tone Skin: Intact, warm, dry. No rashes  Neurologic: Alert, oriented to person, place and time. Fluent speech, no cognitive deficit, no gross focal neuro deficit Psychiatric: Normal affect. Mood is appropriate.    EKG:  The EKG was personally reviewed and demonstrates:    EKG from 05/03/24 paced rhythm   Telemetry:  Telemetry was personally reviewed and demonstrates:    Paced rhythm   Relevant CV Studies:   Echo from 05/05/2024:  1. Left ventricular ejection fraction, by estimation, is 20 to 25%. The  left ventricle has severely decreased function. The left ventricle  demonstrates global hypokinesis. The left ventricular internal cavity size  was severely dilated. Left ventricular  diastolic parameters are indeterminate.   2. Right ventricular systolic function is severely reduced. The right  ventricular size is  severely enlarged. There is mildly elevated pulmonary  artery systolic pressure. The estimated right ventricular systolic  pressure is 39.8 mmHg.   3. Left atrial size was moderately dilated.   4. The mitral valve is normal in structure. Moderate mitral valve  regurgitation. No evidence of mitral stenosis.   5. The aortic valve is tricuspid. There is moderate calcification of the  aortic valve. Aortic valve regurgitation is not visualized. Aortic valve  sclerosis/calcification is present, without any evidence of aortic  stenosis.   6. The inferior vena cava is dilated in size with <50% respiratory  variability, suggesting right atrial pressure of 15 mmHg.   Conclusion(s)/Recommendation(s): No evidence of valvular vegetations on  this transthoracic echocardiogram. Consider a transesophageal  echocardiogram to exclude infective endocarditis if clinically indicated.    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 No LV thrombus by Definity . Left ventricular  ejection fraction, by  estimation, is 20 to 25%. The left ventricle has severely decreased  function. The left ventricle demonstrates regional wall motion  abnormalities (see scoring diagram/findings for  description). The left ventricular internal cavity size was severely  dilated. There is moderate asymmetric left ventricular hypertrophy of the  septal segment. Left ventricular diastolic function could not be  evaluated.   2. Right ventricular systolic function is severely reduced. The right  ventricular size is mildly enlarged. Tricuspid regurgitation signal is  inadequate for assessing PA pressure.   3. Left atrial size was mildly dilated.   4. Large pleural effusion in the left lateral region.   5. The mitral valve is normal in structure. Mild mitral valve  regurgitation. No evidence of mitral stenosis.   6. The aortic valve is tricuspid. There is moderate calcification of the  aortic valve. Aortic valve regurgitation is not visualized. No aortic  stenosis is present.   7. The inferior vena cava is dilated in size with <50% respiratory  variability, suggesting right atrial pressure of 15 mmHg.   Comparison(s): Changes from prior study are noted. LVEF has worsened from  30-35% to 20-25%, RV function is severely reduced and there is a large  left pleural effusion.   Left Heart Catheterization 10/11/20:  LV: Severely dilated.  Global hypokinesis.  Hand contrast injection hence not fully adequately visualized.  LVEF 15 to 20%.  EDP markedly elevated at 29 mmHg.  No pressure gradient across the aortic valve. Left main: Normal. LAD: Severely diffusely diseased.  Gives origin to large D1 which gives collaterals to the LAD and 2 smaller diagonals, LAD is occluded after the origin of D1, anatomy compared to prior in 2017 reveals progression of diffuse disease.  There are ipsilateral and contralateral collaterals to the LAD. CX: Moderate sized vessel, giving origin to large OM1.  OM1 is  occluded in the ostium as was noted previously and has ipsilateral collaterals.  OM1 is diffusely diseased and faintly filled. RCA: Moderate disease in the proximal and mid segment.  At the bifurcation of PDA and PL, there is a high-grade 90% stenosis.  The bifurcation is also involved with at least a 90% stenosis in the PDA and a 60 to 70% stenosis in the PL branch.  There is no target as distally as the PL branch which is large is occluded distally that was also noted in 2017.  There are faint collaterals noted from the left to the RCA.   Impression: Severe native vessel three-vessel coronary artery disease with no significant targets for revascularization, LAD may be amenable for revascularization but I am not sure that this would help.  Patient is extremely ill with high risk for mortality.  He also has underlying stage III-IV kidney disease and contrast nephropathy needs to be evaluated further in view of contrast load of 70 mL. I try to reach his friend on the contact list, unable to reach.    Laboratory Data: High Sensitivity Troponin:  No results for input(s): TROPONINIHS in the last 720 hours.   Chemistry Recent Labs  Lab 05/03/24 2338 05/04/24 0513 05/07/24 0521  NA 139 138 135  K 4.4 3.6 4.0  CL 99 98 100  CO2 23 25 23   GLUCOSE 272* 212* 132*  BUN 43* 45* 57*  CREATININE 1.64* 1.71* 1.94*  CALCIUM  9.4 9.0 8.6*  MG  --  2.1  --  GFRNONAA 45* 43* 37*  ANIONGAP 17* 15 12    Recent Labs  Lab 05/03/24 2338 05/04/24 0513  PROT 6.5 5.8*  ALBUMIN  3.5 3.2*  AST 34 36  ALT 16 17  ALKPHOS 118 100  BILITOT 1.3* 1.5*   Lipids No results for input(s): CHOL, TRIG, HDL, LABVLDL, LDLCALC, CHOLHDL in the last 168 hours.  Hematology Recent Labs  Lab 05/03/24 2338 05/04/24 0513 05/07/24 0521  WBC 12.6* 14.9* 6.9  RBC 4.95 4.24 4.33  HGB 14.0 11.9* 12.5*  HCT 44.8 37.7* 40.4  MCV 90.5 88.9 93.3  MCH 28.3 28.1 28.9  MCHC 31.3 31.6 30.9  RDW 18.8* 18.6* 18.3*   PLT 222 162 157   Thyroid  No results for input(s): TSH, FREET4 in the last 168 hours.  BNP Recent Labs  Lab 05/03/24 2338 05/04/24 0513  BNP 3,479.7* >4,500.0*    DDimer No results for input(s): DDIMER in the last 168 hours.  Radiology/Studies:  ECHOCARDIOGRAM COMPLETE Result Date: 05/05/2024    ECHOCARDIOGRAM REPORT   Patient Name:   Eduardo [name] Date of Exam: 05/05/2024 Medical Rec #:  [id number]    Height:       69.0 in Accession #:    [id number]   Weight:       205.5 lb Date of Birth:  22-Jul-1956   BSA:          2.090 m Patient Age:    67 years     BP:           99/61 mmHg Patient Gender: M            HR:           89 bpm. Exam Location:  Inpatient Procedure: 2D Echo, Cardiac Doppler and Color Doppler (Both Spectral and Color            Flow Doppler were utilized during procedure). Indications:    Endocarditis  History:        Patient has prior history of Echocardiogram examinations.                 Endocarditis; Risk Factors:Hypertension.  Sonographer:    Vella Key Referring Phys: [id number] [facility] IMPRESSIONS  1. Left ventricular ejection fraction, by estimation, is 20 to 25%. The left ventricle has severely decreased function. The left ventricle demonstrates global hypokinesis. The left ventricular internal cavity size was severely dilated. Left ventricular diastolic parameters are indeterminate.  2. Right ventricular systolic function is severely reduced. The right ventricular size is severely enlarged. There is mildly elevated pulmonary artery systolic pressure. The estimated right ventricular systolic pressure is 39.8 mmHg.  3. Left atrial size was moderately dilated.  4. The mitral valve is normal in structure. Moderate mitral valve regurgitation. No evidence of mitral stenosis.  5. The aortic valve is tricuspid. There is moderate calcification of the aortic valve. Aortic valve regurgitation is not visualized. Aortic valve sclerosis/calcification is present, without any evidence  of aortic stenosis.  6. The inferior vena cava is dilated in size with <50% respiratory variability, suggesting right atrial pressure of 15 mmHg. Conclusion(s)/Recommendation(s): No evidence of valvular vegetations on this transthoracic echocardiogram. Consider a transesophageal echocardiogram to exclude infective endocarditis if clinically indicated. FINDINGS  Left Ventricle: Left ventricular ejection fraction, by estimation, is 20 to 25%. The left ventricle has severely decreased function. The left ventricle demonstrates global hypokinesis. The left ventricular internal cavity size was severely dilated. There is no left ventricular hypertrophy. Left ventricular diastolic parameters are indeterminate.  Right Ventricle: The right ventricular size is severely enlarged. No increase in right ventricular wall thickness. Right ventricular systolic function is severely reduced. There is mildly elevated pulmonary artery systolic pressure. The tricuspid regurgitant velocity is 2.49 m/s, and with an assumed right atrial pressure of 15 mmHg, the estimated right ventricular systolic pressure is 39.8 mmHg. Left Atrium: Left atrial size was moderately dilated. Right Atrium: Right atrial size was normal in size. Pericardium: There is no evidence of pericardial effusion. Mitral Valve: The mitral valve is normal in structure. Moderate mitral valve regurgitation. No evidence of mitral valve stenosis. Tricuspid Valve: The tricuspid valve is normal in structure. Tricuspid valve regurgitation is mild . No evidence of tricuspid stenosis. Aortic Valve: The aortic valve is tricuspid. There is moderate calcification of the aortic valve. Aortic valve regurgitation is not visualized. Aortic valve sclerosis/calcification is present, without any evidence of aortic stenosis. Pulmonic Valve: The pulmonic valve was normal in structure. Pulmonic valve regurgitation is not visualized. No evidence of pulmonic stenosis. Aorta: The aortic root is  normal in size and structure. Venous: The inferior vena cava is dilated in size with less than 50% respiratory variability, suggesting right atrial pressure of 15 mmHg. IAS/Shunts: No atrial level shunt detected by color flow Doppler. Additional Comments: A device lead is visualized.  TRICUSPID VALVE TV Peak grad:   35.0 mmHg TV Vmax:        2.96 m/s TR Peak grad:   24.8 mmHg TR Vmax:        249.00 cm/s Toribio Fuel MD Electronically signed by Toribio Fuel MD Signature Date/Time: 05/05/2024/2:22:30 PM    Final    CT CHEST ABDOMEN PELVIS WO CONTRAST Result Date: 05/04/2024 CLINICAL DATA:  Sepsis. EXAM: CT CHEST, ABDOMEN AND PELVIS WITHOUT CONTRAST TECHNIQUE: Multidetector CT imaging of the chest, abdomen and pelvis was performed following the standard protocol without IV contrast. RADIATION DOSE REDUCTION: This exam was performed according to the departmental dose-optimization program which includes automated exposure control, adjustment of the mA and/or kV according to patient size and/or use of iterative reconstruction technique. COMPARISON:  CT scan chest from 03/09/2024 and CT scan abdomen and pelvis from 02/14/2024. FINDINGS: CT CHEST FINDINGS Cardiovascular: Normal cardiac size. No pericardial effusion. No aortic aneurysm. There are coronary artery calcifications, in keeping with coronary artery disease. There are also mild peripheral atherosclerotic vascular calcifications of thoracic aorta and its major branches. Mediastinum/Nodes: Visualized thyroid  gland appears grossly unremarkable. No solid / cystic mediastinal masses. The esophagus is nondistended precluding optimal assessment. There are few mildly enlarged mediastinal lymph nodes, which appear grossly similar to the prior study. No axillary lymphadenopathy by size criteria. Evaluation of bilateral hila is limited due to lack on intravenous contrast: however, no large hilar lymphadenopathy identified. Lungs/Pleura: The central tracheo-bronchial  tree is patent. There is mild, smooth, circumferential thickening of the segmental and subsegmental bronchial walls, throughout bilateral lungs, which is nonspecific. Findings are most commonly seen with bronchitis or reactive airway disease, such as asthma. There is moderate to large left and small-to-moderate right pleural effusion, stable on the left and increased on the right side, since the prior study from 03/09/2024. there are associated compressive atelectatic changes. There are patchy areas of linear, plate-like atelectasis and/or scarring throughout bilateral lungs. No pneumothorax. No suspicious lung nodule. Musculoskeletal: The visualized soft tissues of the chest wall are grossly unremarkable. Note is made of left-sided triple lead cardiac pacemaker. No suspicious osseous lesions. There are mild multilevel degenerative changes in the visualized spine. CT  ABDOMEN PELVIS FINDINGS Hepatobiliary: The liver is normal in size. Non-cirrhotic configuration. Redemonstration of ill-defined, heterogeneous hypoattenuating approximately 9.4 x 12.6 cm mass in the right hepatic lobe, which appears increased in size since the prior study. No intrahepatic or extrahepatic bile duct dilation. The gallbladder is physiologically distended. Small volume layering gallstones/sludge noted without imaging signs of acute cholecystitis. No abnormal wall thickening. No pericholecystic fat stranding. Pancreas: Unremarkable. No pancreatic ductal dilatation or surrounding inflammatory changes. Spleen: Within normal limits. No focal lesion. Adrenals/Urinary Tract: Adrenal glands are unremarkable. No suspicious renal mass within the limitations of this unenhanced exam. No nephroureterolithiasis or obstructive uropathy on either side. There is a partially exophytic cyst arising from the right kidney lower pole measuring approximately 1.4 x 2.0 cm. Redemonstration of mild asymmetric thickening of the anterior bladder wall without  associated perivesical fat stranding. Findings are similar to the prior study and favored to represent sequela of chronic cystitis. No focal urinary bladder mass or bladder calculi. Stomach/Bowel: No disproportionate dilation of the small or large bowel loops. No evidence of abnormal bowel wall thickening or inflammatory changes. The appendix is unremarkable. Vascular/Lymphatic: There is small-to-moderate ascites, new since the prior study. No pneumoperitoneum. No abdominal or pelvic lymphadenopathy, by size criteria. No aneurysmal dilation of the major abdominal arteries. There are marked peripheral atherosclerotic vascular calcifications of the aorta and its major branches. Reproductive: Mildly enlarged prostate gland. Symmetric bilateral seminal vesicles. There is calcification of bilateral vas deferens, nonspecific but commonly seen in diabetic patients. Other: Inferior abdominal midline surgical scar noted. There are bilateral tiny fat containing inguinal hernias. There is mild-to-moderate anasarca. Musculoskeletal: No suspicious osseous lesions. There are mild - moderate multilevel degenerative changes in the visualized spine. IMPRESSION: 1. There is moderate to large left and small-to-moderate right pleural effusion, stable on the left and increased on the right side, since the prior study. There are associated compressive atelectatic changes. No lung mass, consolidation or pneumothorax. 2. Redemonstration of ill-defined, heterogeneous hypoattenuating approximately 9.4 x 12.6 cm mass in the right hepatic lobe, which appears increased in size since the prior study. 3. There is small-to-moderate ascites, new since the prior study. 4. Multiple other nonacute observations, as described above. Electronically Signed   By: Ree Molt M.D.   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 Type 2 diabetes -  Managed per primary team  Chronic systolic heart failure with new onset RV failure since May/2025 Ischemic cardiomyopathy s/p CRT-D 2023 - End-stage cardiomyopathy since hospitalization 03/2024 by advanced heart failure team, deemed not a candidate for advanced therapy due to newly diagnosed cholangiocarcinoma and poor surgical candidate for cure, GDMT had been historically limited by renal disease and hypotension requiring ongoing maximum midodrine  support, diuresis had been suboptimal despite high-dose loop diuretic, he has been recommended to follow palliative care  - now with septic shock 2/2 group B strep bacteremia - TEE and EP evaluation was suggested by primary team, TTE without evident vegetation this admission, clinically he is not a great candidate for any invasive procedure due to high risk mortality, also doubt if TEE and ICD extraction (if needed) will make significant impact/change on his overall health/survival/long term prognosis, maybe we should consider continue conservative therapy with prolonged course of antibiotic for bacteremia at home without further evaluation of endocarditis or ICD infection, he also expressed strong wish to go home and has a realistic understanding of his  health conditions  - Off vasopressor support now, continued on midodrine  15 mg 3 times daily, torsemide  has been resumed at 30 mg twice daily, clinically mildly hypervolemic, if BP stable, may increase torsemide  to 60mg  BID (home dose) tomorrow  Severe multivessel CAD with no good  target for PCI or revascularization since 2021 - no angina  - can resume PTA zetia , crestor , and plavix  for medical therapy   NSVT - can resume PTA amiodarone  200mg  daily for suppression   Risk Assessment/Risk Scores:  [place]  Heart Association (NYHA) Functional Class NYHA Class III   For questions or updates, please contact [facility] Please consult [email or web address] for contact info under    Signed, Xika  Zhao, NP  05/08/2024 10:10 AM  ADDENDUM:   Patient seen and examined with Xika Zhao.  I personally taken a history, examined the patient, reviewed relevant notes,  laboratory data / imaging studies.  I performed a substantive portion of this encounter and formulated the important aspects of the plan.  I agree with the APP's note, impression, and recommendations; however, I have edited the note to reflect changes or salient points.   The patient has a known complex cardiovascular history as outlined above and has had recurrent hospitalizations for various reasons over the last quarter.  This hospitalization has been triggered by feeling tired, fatigue, cough.  Patient diagnosed with group B strep bacteremia and cardiology consulted for possible TEE to evaluate for valvular and device vegetation.  At time of evaluation patient is in ICU sitting upright resting. Denies anginal chest pain. Has known lower extremity swelling despite being on high doses of loop diuretics.  Denies orthopnea, PND.  Patient states that  I am circling down, I know that.   Patient states that at his last outpatient visit with Dr. Elmira shared decision was to consider outpatient palliative/hospice.  He is still awaiting consultation but is willing to proceed forward.  He wants to follow-up with his oncologist later this week.  To discuss management for his cholangiocarcinoma as well.  In the past he was told that he is not a surgical candidate but medical therapy could be considered.   PHYSICAL EXAM: Today's Vitals   05/08/24 1000 05/08/24 1100 05/08/24 1136 05/08/24 1200  BP: 110/66 (!) 102/46  99/61  Pulse: 87 90  84  Resp: (!) 31 (!) 23  (!) 29  Temp:    98.1 F (36.7 C)  TempSrc:    Oral  SpO2: 96% 96%  95%  Weight:      Height:      PainSc:   0-No pain    Body mass index is 30.8 kg/m.   Net IO Since Admission: 1,574.96 mL [05/08/24 1252]  Filed Weights   05/06/24 0439 05/07/24 0500 05/08/24  0500  Weight: 95 kg 94.6 kg 94.6 kg    Physical Exam  Constitutional: No distress. He appears chronically ill.  hemodynamically stable  Neck: JVD present.  Cardiovascular: Normal rate, regular rhythm, S1 normal and S2 normal. Exam reveals no gallop, no S3 and no S4.  Murmur heard. Holosystolic murmur is present with a grade of 3/6 at the apex. Pulmonary/Chest: Effort normal and breath sounds normal. No stridor. He has no wheezes. He has no rales.  CRT-D left infraclavicular space   Musculoskeletal:        General: Edema (LLE, cool to touch.) present.     Cervical back: Neck supple.     Comments: Right BKA   Neurological: He is  alert and oriented to person, place, and time.  Skin: Skin is cool.    EKG: (personally reviewed by me) 05/03/2024 sinus tachycardia, 126 bpm, occasional PVCs, IVCD, left atrial enlargement  Telemetry: (personally reviewed by me) Paced rhythm with rare NSVT.   Impression  & Recommendations:  Chronic systolic and diastolic heart failure with low output state. Cardiomyopathy, end-stage CAD without angina pectoris. NSVT. Cholangiocarcinoma. 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  Patient has had multiple hospitalizations in the recent past & has been evaluated by advanced heart failure in the past and his primary cardiologist earlier this month both of which felt that he would be better suited for palliative/hospice given his comorbidities and recently diagnosed cholangiocarcinoma and not deemed to be a candidate for advanced therapies/surgical interventions.  Clinically he is not in overt heart failure but does have lower extremity swelling and JVP on physical examination.  Medical therapy has been limited due to soft blood pressures and  renal function.  Spoke with the patient at length with regards to overall clinical trajectory.  In an ideal situation transesophageal echocardiogram is indicated to evaluate for valvular or device related vegetation.  Depending on the results it would dictate the duration of antibiotics and if his device / leads were infected would have to discuss w/ EP for extraction. However given his comorbidities and low output/end-stage cardiomyopathy overall risk for such procedures does place him at a higher risk than his peers.  Despite that he has underlying cholangiocarcinoma which is untreated and will remain a nidus for further infections/bacteremia and keep him immunocompromised.   In addition, patient's goals of care are to be discharged home, continue conservative management, and proceed forward with hospice/palliative management.  Patient understands that if he truly has valvular or device related infection it will predispose him to worsening morbidity and mortality.  He verbalizes understanding.  And is in agreement for being treated for presumed endocarditis with prolonged antibiotics.  No family present at bedside.  I recommended discussing it further with his next of kin.  Patient tried to call his significant other Ms. Glade McDermott but it went to voicemail.  Spoke to infectious disease who also agrees that TEE would not change his overall course of management and would be best felt treating him conservatively with a PICC line and prolonged antibiotics.  Recommendations conveyed to attending physician as well.  Patient's nurse Asberry was present in the room during this entire conversation as well.  Medical decision making: High complexity given the bacteremia/cardiomyopathy/low output failure/cholangiocarcinoma Independently reviewed: EKGs during this admission, telemetry, vital signs, I's and O's Prescription drug management as discussed above Interdisciplinary: Discussed care with attending  physician as well as infectious disease   This note was created using a voice recognition software as a result there may be grammatical errors inadvertently enclosed that do not reflect the nature of this encounter. Every attempt is made to correct such errors.   Madonna Michele HAS, [facility]  A Division of [facility] [address], [place], [place] [address]  [place], [place] [address] Pager: [phone] Office: [phone] 05/08/2024 12:52 PM

## 2024-05-08 NOTE — TOC Initial Note (Signed)
 Transition of Care [facility]) - Initial/Assessment Note    Patient Details  Name: Eduardo [name] MRN: [id number] Date of Birth: 06-May-1956  Transition of [facility]) CM/SW Contact:    Jon ONEIDA Anon, RN Phone Number: 05/08/2024, 2:02 PM  Clinical Narrative:                 NCM met with pt at bedside to discuss him receiving palliative care services once he is discharged from the hospital. Pt is agreeable to having these services. Asked if he had a choice and he said no choice. NCM sent referral for home palliative care services to [facility] with [facility] and she accepted the referral. Pt needing IV ABT at home. NCM spoke with Pam with [facility] and she is following. Spoke with [employer] with [facility] and she will accept pt to offer RN services at discharge. Will need HHRN orders. TOC is following.  Expected Discharge Plan: Home w Home Health Services (Home Palliative Care Services) Barriers to Discharge: Continued Medical Work up   Patient Goals and CMS Choice Patient states their goals for this hospitalization and ongoing recovery are:: Home CMS Medicare.gov Compare Post Acute Care list provided to:: Patient Choice offered to / list presented to : Patient [place] ownership interest in [facility].provided to:: Patient    Expected Discharge Plan and Services In-house Referral: Clinical Social Work, Hospice / Palliative Care Discharge Planning Services: CM Consult Post Acute Care Choice: Home Health Living arrangements for the past 2 months: Single Family Home                 DME Arranged: N/A DME Agency: NA       HH Arranged: RN HH Agency: [facility] Date HH Agency Contacted: 05/08/24 Time HH Agency Contacted: 1340 Representative spoke with at [facility] Agency: Shylise at [facility] [phone]  Prior Living Arrangements/Services Living arrangements for the past 2 months: Single Family Home Lives with:: Significant Other Patient language and need  for interpreter reviewed:: Yes Do you feel safe going back to the place where you live?: Yes      Need for Family Participation in Patient Care: No (Comment) Care giver support system in place?: No (comment) Current home services: DME Criminal Activity/Legal Involvement Pertinent to Current Situation/Hospitalization: No - Comment as needed  Activities of Daily Living   ADL Screening (condition at time of admission) Independently performs ADLs?: Yes (appropriate for developmental age) Is the patient deaf or have difficulty hearing?: No Does the patient have difficulty seeing, even when wearing glasses/contacts?: No Does the patient have difficulty concentrating, remembering, or making decisions?: No  Permission Sought/Granted Permission sought to share information with : Family Supports Permission granted to share information with : Yes, Verbal Permission Granted  Share Information with NAME: Marykay Blackbird (Significant other)  [phone]  Permission granted to share info w AGENCY: [facility]        Emotional Assessment Appearance:: Appears stated age Attitude/Demeanor/Rapport: Engaged Affect (typically observed): Accepting Orientation: : Oriented to Self, Oriented to Place, Oriented to  Time, Oriented to Situation Alcohol / Substance Use: Not Applicable Psych Involvement: No (comment)  Admission diagnosis:  Severe sepsis (HCC) [A41.9, R65.20] Sepsis with acute organ dysfunction without septic shock, due to unspecified organism, unspecified organ dysfunction type (HCC) [A41.9, R65.20] Patient Active Problem List   Diagnosis Date Noted   Severe sepsis (HCC) 05/04/2024   Sepsis (HCC) 05/04/2024   Advanced care planning/counseling discussion 04/24/2024   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  HFrEF (heart failure with reduced ejection fraction) (HCC) 03/24/2024   Small bowel obstruction (HCC) 11/14/2023   Small bowel obstruction due to adhesions (HCC) 11/14/2023   ICD  (implantable cardioverter-defibrillator) in place 02/04/2023   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   Chronic post-operative pain 09/18/2022   Abnormality of gait 09/18/2022   LBBB (left bundle branch block) 08/19/2022   Chronic systolic heart failure (HCC) 08/19/2022   Chronic right-sided congestive heart failure (HCC) 05/18/2022   Ejection fraction < 50% 04/17/2022   Insomnia    Constipation    S/P BKA (below knee amputation), right (HCC) 03/21/2022   Ischemic cardiomyopathy    AKI (acute kidney injury) (HCC) 03/13/2022   Streptococcal bacteremia 03/13/2022   Hypophosphatemia 03/13/2022   Septic shock (HCC) 03/08/2022   Acute systolic heart failure (HCC) 12/28/2020   Acute pulmonary edema (HCC) 10/11/2020   Non-ST elevation (NSTEMI) myocardial infarction (HCC) 10/11/2020   Acute respiratory distress 10/11/2020   Acute on chronic combined systolic and diastolic CHF (congestive heart failure) (HCC) 10/11/2020   Cardiogenic shock (HCC)    Partial nontraumatic amputation of foot, right (HCC) 06/21/2019   Osteomyelitis of right foot (HCC)    Essential hypertension, benign 02/06/2019   Hypercholesteremia 02/06/2019   Stage 3b chronic kidney disease (CKD) (HCC) 10/06/2017   Proteinuria 10/06/2017   Controlled type 2 diabetes mellitus with hyperglycemia, with long-term current use of insulin  (HCC) 10/06/2017   Coronary artery disease of native artery of native heart with stable angina pectoris (HCC) 05/23/2017   Abnormal nuclear stress test 09/06/2016   PAD (peripheral artery disease) (HCC) 10/28/2012   Morbid obesity with BMI of 40.0-44.9, adult (HCC) 10/13/2012   Diabetes mellitus with ophthalmic complication (HCC) 10/13/2012   Hypertension associated with diabetes (HCC) 10/13/2012   Atherosclerosis of native artery of extremity with intermittent claudication (HCC) 04/05/2012   Adenomatous colon polyp 09/28/2011   Diabetes mellitus (HCC) 09/28/2011    Pure hyperglyceridemia 09/28/2011   Erectile dysfunction 09/28/2011   Diabetic retinopathy (HCC) 09/28/2011   PCP:  [name], MD Pharmacy:   [facility] [phone] - [place], [place] - [address] AT [facility] [address] [facility] [address] [place] [address] Phone: [phone] Fax: [phone]  [facility] - [place], [place] - [address] [facility] [address] [place] [address] [place] [address] Phone: [phone] Fax: [phone]  [facility] - [place], [place] - [address] [place] [address] Phone: [phone] Fax: [phone]  [facility] [address] [place] [address] Phone: [phone] Fax: [phone]     Social Drivers of Health (SDOH) Social History: SDOH Screenings   Food Insecurity: No Food Insecurity (05/05/2024)  Housing: Low Risk  (05/05/2024)  Transportation Needs: No Transportation Needs (05/05/2024)  Utilities: Not At Risk (05/05/2024)  Depression (PHQ2-9): Low Risk  (02/22/2024)  Financial Resource Strain: Low Risk  (07/28/2023)  Physical Activity: Sufficiently Active (07/28/2023)  Social Connections: Socially Isolated (05/05/2024)  Stress: No Stress Concern Present (07/28/2023)  Tobacco Use: Medium Risk (05/03/2024)   SDOH Interventions:     Readmission Risk Interventions    05/08/2024    1:41 PM 03/28/2024   10:33 AM  Readmission Risk Prevention Plan  Transportation Screening Complete Complete  Home Care Screening  Complete  Medication Review (RN CM)  Complete  Medication Review (RN Care Manager) Complete   PCP or Specialist  appointment within 3-5 days of discharge Complete   HRI or Home Care Consult Complete   SW Recovery Care/Counseling Consult Complete   Palliative Care Screening Complete   Skilled Nursing Facility Not Applicable

## 2024-05-08 NOTE — Progress Notes (Addendum)
 PHARMACY CONSULT NOTE FOR:  OUTPATIENT  PARENTERAL ANTIBIOTIC THERAPY (OPAT)  Indication: GBS bacteremia Regimen: Rocephin  2g IV every 24 hours End date: 06/02/24 (4 weeks from neg BCx 6/27)  IV antibiotic discharge orders are pended. To discharging provider:  please sign these orders via discharge navigator,  Select New Orders & click on the button choice - Manage This Unsigned Work.     Thank you for allowing pharmacy to be a part of this patient's care.  Almarie Lunger, PharmD, BCPS, BCIDP Infectious Diseases Clinical Pharmacist 05/08/2024 12:44 PM   **Pharmacist phone directory can now be found on [email or web address] ([email or web address]).  Listed under [facility].

## 2024-05-08 NOTE — Progress Notes (Addendum)
 [facility]    Date of Admission:  05/03/2024      ID: Eduardo [name] is a 68 y.o. male with group b strep Principal Problem:   Severe sepsis (HCC) Active Problems:   Diabetes mellitus (HCC)   Stage 3b chronic kidney disease (CKD) (HCC)   Essential hypertension, benign   Cholangiocarcinoma (HCC)   Sepsis (HCC)    Subjective: afebrile  Medications:   Chlorhexidine  Gluconate Cloth  6 each Topical Daily   enoxaparin  (LOVENOX ) injection  40 mg Subcutaneous Q24H   feeding supplement (GLUCERNA SHAKE)  237 mL Oral TID BM   insulin  aspart  0-15 Units Subcutaneous TID WC   insulin  aspart  0-5 Units Subcutaneous QHS   midodrine   15 mg Oral TID WC   ondansetron  (ZOFRAN ) IV  4 mg Intravenous Once   pantoprazole  (PROTONIX ) IV  40 mg Intravenous Q24H   tamsulosin   0.4 mg Oral QPC supper   torsemide   30 mg Oral BID    Objective: Vital signs in last 24 hours: Temp:  [97.6 F (36.4 C)-98.1 F (36.7 C)] 98.1 F (36.7 C) (06/30 1200) Pulse Rate:  [71-90] 84 (06/30 1200) Resp:  [11-33] 29 (06/30 1200) BP: (82-110)/(39-76) 99/61 (06/30 1200) SpO2:  [84 %-98 %] 95 % (06/30 1200) Weight:  [94.6 kg] 94.6 kg (06/30 0500)  Physical Exam  Constitutional: He is oriented to person, place, and time. He appears well-developed and well-nourished. No distress.  HENT:  Mouth/Throat: Oropharynx is clear and moist. No oropharyngeal exudate.  Cardiovascular: Normal rate, regular rhythm and normal heart sounds. Exam reveals no gallop and no friction rub.  No murmur heard.  Pulmonary/Chest: Effort normal and breath sounds normal. No respiratory distress. He has no wheezes. Decrease at bases Abdominal: Soft. Bowel sounds are normal. He exhibits no distension. There is no tenderness.  Lymphadenopathy:  He has no cervical adenopathy.  Zku:mphyu BKA, with prosthesis Neurological: He is alert and oriented to person, place, and time.  Skin: Skin is warm and dry. No rash noted.  No erythema.  Psychiatric: He has a normal mood and affect. His behavior is normal.    Lab Results Recent Labs    05/07/24 0521  WBC 6.9  HGB 12.5*  HCT 40.4  NA 135  K 4.0  CL 100  CO2 23  BUN 57*  CREATININE 1.94*   Liver Panel No results for input(s): PROT, ALBUMIN , AST, ALT, ALKPHOS, BILITOT, BILIDIR, IBILI in the last 72 hours. Sedimentation Rate No results for input(s): ESRSEDRATE in the last 72 hours. C-Reactive Protein No results for input(s): CRP in the last 72 hours.  Microbiology:  Studies/Results: US  EKG SITE RITE Result Date: 05/08/2024 If Site Rite image not attached, placement could not be confirmed due to current cardiac rhythm.  Reviewed TTE   Assessment/Plan: Group b strep complicated bacteremia in the setting of cholangiocarcinoma and BiV ICD - too high risk for TEE - recommend to treat for 4 wk of ceftriaxone  2gm IV daily - will follow up in the clinic to decide if need to extend beyond 7/25. - will arrange for home health  Cholangiocarcinoma= to follow up with oncology, dr cloretta for decision of when to start treatment given he is receiving treatment for complicated bacteremia.  Acute on chronic systolic heart failure,EF20% with BiV-ICD= continue on midodrine  15mg TID and torsemide  30mg  BID for now with aim to resume home dose.  Plan discussed with cardiology and dr lue  evaluation of this patient  requires complex antimicrobial therapy evaluation and counseling and isolation needs for disease transmission risk assessment and mitigation.  ---------- Diagnosis: Group b strep bacteremia  Culture Result: gbs  Allergies  Allergen Reactions   Empagliflozin      Other Reaction(s): foot infection   Shellfish-Derived Products Nausea And Vomiting and Other (See Comments)    Only Mussels cause severe nausea and vomiting    Latex Rash   Tape Rash and Other (See Comments)    Caused issues with the skin   Testosterone   Rash    did not feel well     OPAT Orders Discharge antibiotics to be given via PICC line Discharge antibiotics: Per pharmacy protocol ceftriaxone  2gm iv daily  Duration: 4 wk End Date: 06/02/24  [facility] Care Per Protocol:  Home health RN for IV administration and teaching; PICC line care and labs.    Labs weekly while on IV antibiotics: _x_ CBC with differential _x_ BMP   _x_ Please pull PIC at completion of IV antibiotics  Fax weekly labs to [phone]  Clinic Follow Up Appt: 4 wk     [facility] for Infectious Diseases Pager: [phone]  05/08/2024, 12:46 PM

## 2024-05-09 ENCOUNTER — Other Ambulatory Visit (HOSPITAL_COMMUNITY): Payer: Self-pay

## 2024-05-09 DIAGNOSIS — R7881 Bacteremia: Secondary | ICD-10-CM | POA: Diagnosis not present

## 2024-05-09 DIAGNOSIS — I38 Endocarditis, valve unspecified: Secondary | ICD-10-CM | POA: Diagnosis present

## 2024-05-09 DIAGNOSIS — I2510 Atherosclerotic heart disease of native coronary artery without angina pectoris: Secondary | ICD-10-CM | POA: Diagnosis not present

## 2024-05-09 DIAGNOSIS — I330 Acute and subacute infective endocarditis: Secondary | ICD-10-CM

## 2024-05-09 DIAGNOSIS — I10 Essential (primary) hypertension: Secondary | ICD-10-CM

## 2024-05-09 DIAGNOSIS — A401 Sepsis due to streptococcus, group B: Secondary | ICD-10-CM | POA: Diagnosis not present

## 2024-05-09 DIAGNOSIS — I5084 End stage heart failure: Secondary | ICD-10-CM

## 2024-05-09 LAB — BASIC METABOLIC PANEL WITH GFR
Anion gap: 12 (ref 5–15)
BUN: 58 mg/dL — ABNORMAL HIGH (ref 8–23)
CO2: 23 mmol/L (ref 22–32)
Calcium: 8.1 mg/dL — ABNORMAL LOW (ref 8.9–10.3)
Chloride: 97 mmol/L — ABNORMAL LOW (ref 98–111)
Creatinine, Ser: 1.73 mg/dL — ABNORMAL HIGH (ref 0.61–1.24)
GFR, Estimated: 42 mL/min — ABNORMAL LOW (ref >60)
Glucose, Bld: 174 mg/dL — ABNORMAL HIGH (ref 70–99)
Potassium: 3.6 mmol/L (ref 3.5–5.1)
Sodium: 132 mmol/L — ABNORMAL LOW (ref 135–145)

## 2024-05-09 LAB — CBC
HCT: 38.8 % — ABNORMAL LOW (ref 39.0–52.0)
Hemoglobin: 12.2 g/dL — ABNORMAL LOW (ref 13.0–17.0)
MCH: 28.4 pg (ref 26.0–34.0)
MCHC: 31.4 g/dL (ref 30.0–36.0)
MCV: 90.2 fL (ref 80.0–100.0)
Platelets: 149 10*3/uL — ABNORMAL LOW (ref 150–400)
RBC: 4.3 MIL/uL (ref 4.22–5.81)
RDW: 17.8 % — ABNORMAL HIGH (ref 11.5–15.5)
WBC: 6.5 10*3/uL (ref 4.0–10.5)
nRBC: 0 % (ref 0.0–0.2)

## 2024-05-09 LAB — CULTURE, BLOOD (ROUTINE X 2): Culture: NO GROWTH

## 2024-05-09 LAB — GLUCOSE, CAPILLARY
Glucose-Capillary: 180 mg/dL — ABNORMAL HIGH (ref 70–99)
Glucose-Capillary: 211 mg/dL — ABNORMAL HIGH (ref 70–99)

## 2024-05-09 MED ORDER — MIDODRINE HCL 10 MG PO TABS: 15.0000 mg | ORAL_TABLET | Freq: Three times a day (TID) | ORAL | 0 refills | Status: DC

## 2024-05-09 MED ORDER — TORSEMIDE 10 MG PO TABS: 30.0000 mg | ORAL_TABLET | Freq: Two times a day (BID) | ORAL | 0 refills | Status: DC

## 2024-05-09 MED ORDER — TORSEMIDE 10 MG PO TABS
30.0000 mg | ORAL_TABLET | Freq: Two times a day (BID) | ORAL | 0 refills | Status: DC
  Filled 2024-05-09: qty 90, 15d supply, fill #0

## 2024-05-09 MED ORDER — CEFTRIAXONE IV (FOR PTA / DISCHARGE USE ONLY): 2.0000 g | INTRAVENOUS | 0 refills | Status: AC

## 2024-05-09 MED ORDER — MIDODRINE HCL 10 MG PO TABS
15.0000 mg | ORAL_TABLET | Freq: Three times a day (TID) | ORAL | 0 refills | Status: DC
  Filled 2024-05-09: qty 135, 30d supply, fill #0

## 2024-05-09 NOTE — Plan of Care (Signed)
  Problem: Coping: Goal: Level of anxiety will decrease Outcome: Progressing   Problem: Elimination: Goal: Will not experience complications related to urinary retention Outcome: Progressing   Problem: Pain Managment: Goal: General experience of comfort will improve and/or be controlled Outcome: Progressing   Problem: Skin Integrity: Goal: Risk for impaired skin integrity will decrease Outcome: Progressing

## 2024-05-09 NOTE — Evaluation (Signed)
 Occupational Therapy Evaluation Patient Details Name: Eduardo [name] MRN: [id number] DOB: 12-03-1955 Today's Date: 05/09/2024   History of Present Illness   Eduardo [name] is a 68 y.o. male with medical history significant of chronic HFrEF s/p ICD implantation, multivessel CAD, HTN, R BKA,  recent diagnosis of cholangiocarcinoma, PAD, type 2 diabetes, Stage 2-3 CKD presenting with sepsis secondary to GBS bacteremia.     Clinical Impressions PTA, patient lives at home with significant other and was mod I with R LE prosthesis including driving. Currently, patient presents with deficits outlined below (see OT Problem List for details) most significantly generalized muscle weakness, balance and activity tolerance deficits impacting functional mobility and ADL's. Patient requires continued Acute care hospital level OT services to progress safety and functional performance and allow for discharge. Anticipate with family A and support, patient would benefit from HHOT upon hospital discharge. Patient reports having all needed DME.       If plan is discharge home, recommend the following:   A little help with walking and/or transfers;A little help with bathing/dressing/bathroom;Assistance with cooking/housework;Assist for transportation;Help with stairs or ramp for entrance     Functional Status Assessment   Patient has had a recent decline in their functional status and demonstrates the ability to make significant improvements in function in a reasonable and predictable amount of time.     Equipment Recommendations   None recommended by OT      Precautions/Restrictions   Precautions Precautions: Fall Restrictions Weight Bearing Restrictions Per Provider Order: No     Mobility Bed Mobility Overal bed mobility: Needs Assistance Bed Mobility: Supine to Sit     Supine to sit: Min assist, HOB elevated     General bed mobility comments: cues and min A for trunk     Transfers Overall transfer level: Needs assistance Equipment used: Rolling walker (2 wheels) Transfers: Sit to/from Stand Sit to Stand: Contact guard assist           General transfer comment: min cues and sit to stand x 3 from EOB to ensure proper R prosthetic seating      Balance Overall balance assessment: Needs assistance Sitting-balance support: Feet supported Sitting balance-Leahy Scale: Good     Standing balance support: Bilateral upper extremity supported, During functional activity, Reliant on assistive device for balance Standing balance-Leahy Scale: Fair                             ADL either performed or assessed with clinical judgement   ADL Overall ADL's : Needs assistance/impaired Eating/Feeding: Independent   Grooming: Wash/dry hands;Wash/dry face;Sitting;Modified independent   Upper Body Bathing: Modified independent;Sitting   Lower Body Bathing: Supervison/ safety;Sit to/from stand   Upper Body Dressing : Modified independent;Sitting   Lower Body Dressing: Supervision/safety;Sit to/from stand Lower Body Dressing Details (indicate cue type and reason): including R LE Toilet Transfer: Supervision/safety;Regular Toilet;Grab bars;Rolling walker (2 wheels)   Toileting- Clothing Manipulation and Hygiene: Modified independent;Sitting/lateral lean       Functional mobility during ADLs: Supervision/safety;Rolling walker (2 wheels) General ADL Comments: cues for breathing and pacing     Vision Baseline Vision/History: 0 No visual deficits Ability to See in Adequate Light: 0 Adequate Vision Assessment?: No apparent visual deficits            Pertinent Vitals/Pain Pain Assessment Pain Assessment: No/denies pain     Extremity/Trunk Assessment Upper Extremity Assessment Upper Extremity Assessment: Generalized weakness;Right hand dominant  Lower Extremity Assessment Lower Extremity Assessment: Generalized weakness   Cervical /  Trunk Assessment Cervical / Trunk Assessment: Normal   Communication Communication Communication: No apparent difficulties   Cognition Arousal: Alert Behavior During Therapy: WFL for tasks assessed/performed Cognition: No apparent impairments                               Following commands: Intact       Cueing  General Comments   Cueing Techniques: Verbal cues  mild distal LE's including residdual limb wirth +1 edema, remained >92 % on RA , see PT notes for walking O2 testing           Home Living Family/patient expects to be discharged to:: Private residence Living Arrangements: Spouse/significant other Available Help at Discharge: Family Type of Home: House Home Access: Ramped entrance     Home Layout: One level     Bathroom Shower/Tub: Producer, television/film/video: Standard Bathroom Accessibility: Yes How Accessible: Accessible via walker;Accessible via wheelchair Home Equipment: Rolling Walker (2 wheels);Rollator (4 wheels);Wheelchair - manual;BSC/3in1;Shower seat;Grab bars - toilet   Additional Comments: not using O2 at baseline      Prior Functioning/Environment Prior Level of Function : Independent/Modified Independent             Mobility Comments: walks without AD, drives, uses WC at night for trips to bathroom 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, independent with ADLs    OT Problem List: Decreased strength;Decreased activity tolerance;Impaired balance (sitting and/or standing);Cardiopulmonary status limiting activity;Increased edema   OT Treatment/Interventions: Self-care/ADL training;Therapeutic exercise;Energy conservation;DME and/or AE instruction;Therapeutic activities;Patient/family education;Balance training      OT Goals(Current goals can be found in the care plan section)   Acute Rehab OT Goals Patient Stated Goal: to get home OT Goal Formulation: With patient Time For Goal Achievement: 05/23/24 Potential  to Achieve Goals: Good ADL Goals Pt Will Perform Lower Body Bathing: with modified independence;sit to/from stand Pt Will Perform Lower Body Dressing: with modified independence;sit to/from stand Pt Will Transfer to Toilet: with modified independence;ambulating;grab bars Pt/caregiver will Perform Home Exercise Program: Increased strength;Both right and left upper extremity;Independently;With written HEP provided Additional ADL Goal #1: Patient will teach back 4/4 ECT's during ADL's and mobility indep   OT Frequency:  Min 2X/week    Co-evaluation   Reason for Co-Treatment: Complexity of the patient's impairments (multi-system involvement);Necessary to address cognition/behavior during functional activity;For patient/therapist safety PT goals addressed during session: Mobility/safety with mobility;Balance;Proper use of DME OT goals addressed during session: ADL's and self-care;Proper use of Adaptive equipment and DME      AM-PAC OT 6 Clicks Daily Activity     Outcome Measure Help from another person eating meals?: None Help from another person taking care of personal grooming?: None Help from another person toileting, which includes using toliet, bedpan, or urinal?: A Little Help from another person bathing (including washing, rinsing, drying)?: A Little Help from another person to put on and taking off regular upper body clothing?: None Help from another person to put on and taking off regular lower body clothing?: A Little 6 Click Score: 21   End of Session Equipment Utilized During Treatment: Gait belt;Rolling walker (2 wheels);Other (comment) (R LE prosthesis) Nurse Communication: Mobility status;Other (comment) (O2 status and voiding info)  Activity Tolerance: Patient tolerated treatment well Patient left: in chair;with call bell/phone within reach;with chair alarm set  OT Visit Diagnosis: Unsteadiness on feet (R26.81);Muscle weakness (  generalized) (M62.81)                 Time: 1022-1050 OT Time Calculation (min): 28 min Charges:  OT General Charges $OT Visit: 1 Visit OT Evaluation $OT Eval Low Complexity: 1 Low  Keon Pender OT/L Acute Rehabilitation Department  [phone]  05/09/2024, 12:18 PM

## 2024-05-09 NOTE — Discharge Summary (Signed)
 Physician Discharge Summary  Eduardo [name] FMW:[id number] DOB: 1956-04-16 DOA: 05/03/2024  PCP: [name], MD  Admit date: 05/03/2024 Discharge date: 05/09/2024  Admitted From: Home Disposition: Home  Recommendations for Outpatient Follow-up:  Follow up with PCP in 1-2 weeks Follow-up with cardiology as scheduled Follow-up with infectious disease as below Continue antibiotic infusions as discussed through 7/25  Home Health: None Equipment/Devices: None  Discharge Condition: Stable CODE STATUS: DNR Diet recommendation: Low-salt low-fat diet, fluid restricted  Brief/Interim Summary: Eduardo [name] is a 68 y.o. 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  Patient admitted as above with septic shock with notable group B strep bacteremia, lengthy discussion with hospital team including cardiology, infectious disease-given patient's risk factors will avoid repeat interventions or procedures including TEE and ICD evaluation for removal/replacement.  Will treat patient as presumed positive endocarditis given inability to perform imaging to rule out endocarditis.  Continue ceftriaxone  through 06/02/2024 per discussion with ID, PICC line placed successfully without issue.  At this time given patient's rapid improvement, now on increased midodrine  dosing from baseline but otherwise remains on his home medications with minimal changes per cardiology as below including torsemide , amiodarone .  Follow-up as above with cardiology, PCP, infectious disease as scheduled.  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   Cholangiocarcinoma (HCC)   Severe sepsis (HCC)   End stage congestive heart failure (HCC)   Cardiomyopathy (HCC)   NSVT (nonsustained ventricular tachycardia) (HCC)  Septic shock, resolving Secondary to group B strep bacteremia Meeting sepsis criteria with Tmax 103.3, HR 100s, WBC 12.6  Lactate 3.2-->2.2 requiring pressors to maintain MAP during acute illness  Appreciate infectious disease insight recommendations -continue Ceftriaxine through 06/02/24 Echo 6/27 - EF 20-25%, LV global hypokinesis, RV enlarged, Aortic calcification/sclerosis without obvious vegetation Discussed case with cardiology, given patient's elevated risk of procedure given his baseline cardiac function and comorbid's it may be prudent to treat patient as presumed endocarditis, would likely not be a candidate for ICD revision or surgery. Repeat cultures per protocol preliminary negative   Hypotension, chronic Prior history of essential hypertension SBP 80s-100s in setting of sepsis  Borderline hypotension over the last month in clinic, diastolic blood pressure ranging 80-100 -continue midodrine  at increased dose 15 TID Remains generally asymptomatic despite borderline hypotension, without tachycardia or positive orthostatics   Cholangiocarcinoma Noted recent diagnosis of cholangiocarcinoma  Not deemed to be a candidate for advanced therapies/surgical intervention  ID concerned this may be a source of seeding given highly vascular involvement    HFrEF (heart failure with reduced ejection fraction) 2D ECHO 03/2024 w/ EF 20-25%, RV moderately reduced  Recently admitted May 16 through 27 Continue torsemide  per cardiology   Stage 3b chronic kidney disease Baseline Cr 1.5-1.7 w/ GFR in the 40s  Looks to be near baseline today    BPH  Continue tamsulosin     Controlled diabetes mellitus A1C 6.7 -continue current regimen, diabetic diet   Discharge Instructions  Discharge Instructions  [facility] pharmacist to adjust dose for Vancomycin , Aminoglycosides and other anti-infective therapies as requested by physician.   Complete by: As directed    [facility] to provide Cath Flo 2mg    Complete by: As directed    Administer for PICC line occlusion and as ordered by physician for other access device issues.   Anaphylaxis Kit: Provided to treat any anaphylactic reaction to the medication being provided to the patient if First Dose or when requested by physician   Complete by: As directed    Epinephrine 1mg /ml vial / amp: Administer 0.3mg  (0.17ml) subcutaneously once for moderate to severe anaphylaxis, nurse to call physician and pharmacy when reaction occurs and call 911 if needed for immediate care   Diphenhydramine  50mg /ml IV vial: Administer 25-50mg  IV/IM PRN for first dose reaction, rash, itching, mild reaction, nurse to call physician and pharmacy when reaction occurs   Sodium Chloride  0.9% NS 500ml IV: Administer if needed for hypovolemic blood pressure drop or as ordered by physician after call to physician with anaphylactic reaction   Call MD for:  difficulty breathing, headache or visual disturbances   Complete by: As directed    Call MD for:  extreme fatigue   Complete by: As directed    Call MD for:  hives   Complete by: As directed    Call MD for:  persistant dizziness or light-headedness   Complete by: As directed    Call MD for:  persistant nausea and vomiting   Complete by: As directed    Call MD for:  severe uncontrolled pain   Complete by: As directed    Call MD for:  temperature >100.4   Complete by: As directed    Change dressing on IV access line weekly and PRN   Complete by: As directed    Diet - low sodium heart healthy   Complete by: As directed    Flush IV access with Sodium Chloride  0.9% and  Heparin  10 units/ml or 100 units/ml   Complete by: As directed    Home infusion instructions - [facility]   Complete by: As directed    Instructions: Flush IV access with Sodium Chloride  0.9% and Heparin  10units/ml or 100units/ml   Change dressing on IV access line: Weekly and PRN   Instructions Cath Flo 2mg : Administer for PICC Line occlusion and as ordered by physician for other access device   [facility] pharmacist to adjust dose for: Vancomycin , Aminoglycosides and other anti-infective therapies as requested by physician   Increase activity slowly   Complete by: As directed    Method of administration may be changed at the discretion of home infusion pharmacist based upon assessment of the patient and/or caregiver's ability to self-administer the medication ordered   Complete by: As directed       Allergies as of 05/09/2024       Reactions   Empagliflozin     Other Reaction(s): foot infection   Shellfish-derived Products Nausea And Vomiting, Other (See Comments)   Only Mussels cause severe nausea and vomiting   Latex Rash   Tape Rash, Other (See Comments)   Caused issues with the skin   Testosterone  Rash   did not feel well        Medication List     STOP taking these medications    amiodarone  200 MG tablet Commonly known as: PACERONE        TAKE these medications    cefTRIAXone  IVPB Commonly known as: ROCEPHIN   Inject 2 g into the vein daily for 25 days. Indication:  GBS bacteremia First Dose: Yes Last Day of Therapy:  06/02/24 Labs - Once weekly:  CBC/D and BMP, Labs - Once weekly: ESR and CRP Method of administration: IV Push Method of administration may be changed at the discretion of home infusion pharmacist based upon assessment of the patient and/or caregiver's ability to self-administer the medication ordered.   clopidogrel  75 MG tablet Commonly known as: PLAVIX  TAKE 1 TABLET BY MOUTH DAILY   ESTER C PO Take 1,000 mg by mouth  daily.   ezetimibe  10 MG tablet Commonly known as: ZETIA  TAKE 1 TABLET BY MOUTH DAILY   famotidine  10 MG tablet Commonly known as: PEPCID  Take 10 mg by mouth 2 (two) times daily as needed for heartburn or indigestion.   FreeStyle Libre 2 Plus Sensor Misc 1 Application.   gemfibrozil  600 MG tablet Commonly known as: LOPID  Take 600 mg by mouth 2 (two) times daily before a meal.   HumaLOG KwikPen 100 UNIT/ML KwikPen Generic drug: insulin  lispro Inject 3-10 Units into the skin 3 (three) times daily. Inject 01-1009 units into the skin three times a day, per sliding scale- based on BGL >100   HumuLIN  N KwikPen 100 UNIT/ML KwikPen Generic drug: Insulin  NPH (Human) (Isophane) Inject 6 Units into the skin daily. If BS is under 150 do not take per provider   Krill Oil 300 MG Caps Take 500 mg by mouth daily.   lidocaine  5 % ointment Commonly known as: XYLOCAINE  Apply 1 Application topically as needed. For pain as needed- can use up to 4x/day   Melatonin 10 MG Tabs Take 10 mg by mouth at bedtime.   midodrine  10 MG tablet Commonly known as: PROAMATINE  Take 1.5 tablets (15 mg total) by mouth 3 (three) times daily with meals. What changed: medication strength   multivitamin with minerals Tabs tablet Take 1 tablet by mouth daily.   nitroGLYCERIN  0.4 MG SL tablet Commonly known as: NITROSTAT  Place 1 tablet (0.4 mg total) under the tongue every 5 (five) minutes as needed for chest pain.   potassium chloride  SA 20 MEQ tablet Commonly known as: KLOR-CON  M Take 2 tablets (40 mEq total) by mouth daily. What changed:  how much to take when to take this   PROBIOTIC-10 PO Take 1 capsule by mouth daily.   rosuvastatin  20 MG tablet Commonly known as: CRESTOR  Take 1 tablet (20 mg total) by mouth daily.   sodium chloride  0.65 % Soln nasal spray Commonly known as: OCEAN Place 1 spray into both nostrils as needed for congestion.   tamsulosin  0.4 MG Caps capsule Commonly known as:  FLOMAX  Take 1 capsule (0.4 mg total) by mouth daily after supper.   torsemide  10 MG tablet Commonly known as: DEMADEX  Take 3 tablets (30 mg total) by mouth 2 (two) times daily. What changed:  medication strength how much to take   Vitamin D  50 MCG (2000 UT) Caps Take 2,000 Units by mouth daily.               Discharge Care Instructions  (From admission, onward)           Start     Ordered   05/09/24 0000  Change dressing on IV access line weekly and PRN  (Home infusion instructions - [facility] )        05/09/24 1349            Allergies  Allergen Reactions  Empagliflozin      Other Reaction(s): foot infection   Shellfish-Derived Products Nausea And Vomiting and Other (See Comments)    Only Mussels cause severe nausea and vomiting    Latex Rash   Tape Rash and Other (See Comments)    Caused issues with the skin   Testosterone  Rash    did not feel well     Consultations: ID, cardiology   Procedures/Studies: US  EKG SITE RITE Result Date: 05/08/2024 If Site Rite image not attached, placement could not be confirmed due to current cardiac rhythm.  ECHOCARDIOGRAM COMPLETE Result Date: 05/05/2024    ECHOCARDIOGRAM REPORT   Patient Name:   Eduardo [name] Date of Exam: 05/05/2024 Medical Rec #:  [id number]    Height:       69.0 in Accession #:    [id number]   Weight:       205.5 lb Date of Birth:  [birth year]   BSA:          2.090 m Patient Age:    67 years     BP:           99/61 mmHg Patient Gender: M            HR:           89 bpm. Exam Location:  Inpatient Procedure: 2D Echo, Cardiac Doppler and Color Doppler (Both Spectral and Color            Flow Doppler were utilized during procedure). Indications:    Endocarditis  History:        Patient has prior history of Echocardiogram examinations.                 Endocarditis; Risk Factors:Hypertension.  Sonographer:    Vella Key Referring Phys: [id number] [facility] [place] IMPRESSIONS  1. Left ventricular  ejection fraction, by estimation, is 20 to 25%. The left ventricle has severely decreased function. The left ventricle demonstrates global hypokinesis. The left ventricular internal cavity size was severely dilated. Left ventricular diastolic parameters are indeterminate.  2. Right ventricular systolic function is severely reduced. The right ventricular size is severely enlarged. There is mildly elevated pulmonary artery systolic pressure. The estimated right ventricular systolic pressure is 39.8 mmHg.  3. Left atrial size was moderately dilated.  4. The mitral valve is normal in structure. Moderate mitral valve regurgitation. No evidence of mitral stenosis.  5. The aortic valve is tricuspid. There is moderate calcification of the aortic valve. Aortic valve regurgitation is not visualized. Aortic valve sclerosis/calcification is present, without any evidence of aortic stenosis.  6. The inferior vena cava is dilated in size with <50% respiratory variability, suggesting right atrial pressure of 15 mmHg. Conclusion(s)/Recommendation(s): No evidence of valvular vegetations on this transthoracic echocardiogram. Consider a transesophageal echocardiogram to exclude infective endocarditis if clinically indicated. FINDINGS  Left Ventricle: Left ventricular ejection fraction, by estimation, is 20 to 25%. The left ventricle has severely decreased function. The left ventricle demonstrates global hypokinesis. The left ventricular internal cavity size was severely dilated. There is no left ventricular hypertrophy. Left ventricular diastolic parameters are indeterminate. Right Ventricle: The right ventricular size is severely enlarged. No increase in right ventricular wall thickness. Right ventricular systolic function is severely reduced. There is mildly elevated pulmonary artery systolic pressure. The tricuspid regurgitant velocity is 2.49 m/s, and with an assumed right atrial pressure of 15 mmHg, the estimated right ventricular  systolic pressure is 39.8 mmHg. Left Atrium: Left atrial size was moderately dilated. Right Atrium: Right  atrial size was normal in size. Pericardium: There is no evidence of pericardial effusion. Mitral Valve: The mitral valve is normal in structure. Moderate mitral valve regurgitation. No evidence of mitral valve stenosis. Tricuspid Valve: The tricuspid valve is normal in structure. Tricuspid valve regurgitation is mild . No evidence of tricuspid stenosis. Aortic Valve: The aortic valve is tricuspid. There is moderate calcification of the aortic valve. Aortic valve regurgitation is not visualized. Aortic valve sclerosis/calcification is present, without any evidence of aortic stenosis. Pulmonic Valve: The pulmonic valve was normal in structure. Pulmonic valve regurgitation is not visualized. No evidence of pulmonic stenosis. Aorta: The aortic root is normal in size and structure. Venous: The inferior vena cava is dilated in size with less than 50% respiratory variability, suggesting right atrial pressure of 15 mmHg. IAS/Shunts: No atrial level shunt detected by color flow Doppler. Additional Comments: A device lead is visualized.  TRICUSPID VALVE TV Peak grad:   35.0 mmHg TV Vmax:        2.96 m/s TR Peak grad:   24.8 mmHg TR Vmax:        249.00 cm/s Eduardo Fuel MD Electronically signed by Eduardo Fuel MD Signature Date/Time: 05/05/2024/2:22:30 PM    Final    CT CHEST ABDOMEN PELVIS WO CONTRAST Result Date: 05/04/2024 CLINICAL DATA:  Sepsis. EXAM: CT CHEST, ABDOMEN AND PELVIS WITHOUT CONTRAST TECHNIQUE: Multidetector CT imaging of the chest, abdomen and pelvis was performed following the standard protocol without IV contrast. RADIATION DOSE REDUCTION: This exam was performed according to the departmental dose-optimization program which includes automated exposure control, adjustment of the mA and/or kV according to patient size and/or use of iterative reconstruction technique. COMPARISON:  CT scan chest  from 03/09/2024 and CT scan abdomen and pelvis from 02/14/2024. FINDINGS: CT CHEST FINDINGS Cardiovascular: Normal cardiac size. No pericardial effusion. No aortic aneurysm. There are coronary artery calcifications, in keeping with coronary artery disease. There are also mild peripheral atherosclerotic vascular calcifications of thoracic aorta and its major branches. Mediastinum/Nodes: Visualized thyroid  gland appears grossly unremarkable. No solid / cystic mediastinal masses. The esophagus is nondistended precluding optimal assessment. There are few mildly enlarged mediastinal lymph nodes, which appear grossly similar to the prior study. No axillary lymphadenopathy by size criteria. Evaluation of bilateral hila is limited due to lack on intravenous contrast: however, no large hilar lymphadenopathy identified. Lungs/Pleura: The central tracheo-bronchial tree is patent. There is mild, smooth, circumferential thickening of the segmental and subsegmental bronchial walls, throughout bilateral lungs, which is nonspecific. Findings are most commonly seen with bronchitis or reactive airway disease, such as asthma. There is moderate to large left and small-to-moderate right pleural effusion, stable on the left and increased on the right side, since the prior study from 03/09/2024. there are associated compressive atelectatic changes. There are patchy areas of linear, plate-like atelectasis and/or scarring throughout bilateral lungs. No pneumothorax. No suspicious lung nodule. Musculoskeletal: The visualized soft tissues of the chest wall are grossly unremarkable. Note is made of left-sided triple lead cardiac pacemaker. No suspicious osseous lesions. There are mild multilevel degenerative changes in the visualized spine. CT ABDOMEN PELVIS FINDINGS Hepatobiliary: The liver is normal in size. Non-cirrhotic configuration. Redemonstration of ill-defined, heterogeneous hypoattenuating approximately 9.4 x 12.6 cm mass in the  right hepatic lobe, which appears increased in size since the prior study. No intrahepatic or extrahepatic bile duct dilation. The gallbladder is physiologically distended. Small volume layering gallstones/sludge noted without imaging signs of acute cholecystitis. No abnormal wall thickening. No pericholecystic fat stranding.  Pancreas: Unremarkable. No pancreatic ductal dilatation or surrounding inflammatory changes. Spleen: Within normal limits. No focal lesion. Adrenals/Urinary Tract: Adrenal glands are unremarkable. No suspicious renal mass within the limitations of this unenhanced exam. No nephroureterolithiasis or obstructive uropathy on either side. There is a partially exophytic cyst arising from the right kidney lower pole measuring approximately 1.4 x 2.0 cm. Redemonstration of mild asymmetric thickening of the anterior bladder wall without associated perivesical fat stranding. Findings are similar to the prior study and favored to represent sequela of chronic cystitis. No focal urinary bladder mass or bladder calculi. Stomach/Bowel: No disproportionate dilation of the small or large bowel loops. No evidence of abnormal bowel wall thickening or inflammatory changes. The appendix is unremarkable. Vascular/Lymphatic: There is small-to-moderate ascites, new since the prior study. No pneumoperitoneum. No abdominal or pelvic lymphadenopathy, by size criteria. No aneurysmal dilation of the major abdominal arteries. There are marked peripheral atherosclerotic vascular calcifications of the aorta and its major branches. Reproductive: Mildly enlarged prostate gland. Symmetric bilateral seminal vesicles. There is calcification of bilateral vas deferens, nonspecific but commonly seen in diabetic patients. Other: Inferior abdominal midline surgical scar noted. There are bilateral tiny fat containing inguinal hernias. There is mild-to-moderate anasarca. Musculoskeletal: No suspicious osseous lesions. There are mild -  moderate multilevel degenerative changes in the visualized spine. IMPRESSION: 1. There is moderate to large left and small-to-moderate right pleural effusion, stable on the left and increased on the right side, since the prior study. There are associated compressive atelectatic changes. No lung mass, consolidation or pneumothorax. 2. Redemonstration of ill-defined, heterogeneous hypoattenuating approximately 9.4 x 12.6 cm mass in the right hepatic lobe, which appears increased in size since the prior study. 3. There is small-to-moderate ascites, new since the prior study. 4. Multiple other nonacute observations, as described above. Electronically Signed   By: Ree Molt M.D.   On: 05/04/2024 16:13   DG Chest 2 View Result Date: 05/04/2024 CLINICAL DATA:  Fever and chills in known insert patient EXAM: CHEST - 2 VIEW COMPARISON:  04/03/2024 FINDINGS: Cardiac shadow is enlarged but stable. Defibrillator is again seen and stable. Increasing left effusion with basilar atelectasis is noted. No other focal abnormality is noted. IMPRESSION: Increasing left effusion and basilar atelectasis. Electronically Signed   By: Oneil Devonshire M.D.   On: 05/04/2024 01:47   CUP PACEART REMOTE DEVICE CHECK Result Date: 04/20/2024 ICD-Scheduled remote reviewed. Normal device function.  Presenting rhythm: AS/BiVP Next remote 91 days. ML, CVRS    Subjective: No acute issues or events overnight denies nausea vomit diarrhea constipation headache fevers chills or chest pain   Discharge Exam: Vitals:   05/09/24 0800 05/09/24 1200  BP:    Pulse:    Resp:    Temp: (!) 97.3 F (36.3 C) (!) 97.4 F (36.3 C)  SpO2:     Vitals:   05/09/24 0500 05/09/24 0600 05/09/24 0800 05/09/24 1200  BP: (!) 103/56 105/67    Pulse: 78 77    Resp: 20 17    Temp:   (!) 97.3 F (36.3 C) (!) 97.4 F (36.3 C)  TempSrc:   Oral Oral  SpO2: 100% 100%    Weight:      Height:        General: Pt is alert, awake, not in acute  distress Cardiovascular: RRR, S1/S2 +, no rubs, no gallops Respiratory: CTA bilaterally, no wheezing, no rhonchi Abdominal: Soft, NT, ND, bowel sounds + Extremities: no edema, no cyanosis, right BKA    The  results of significant diagnostics from this hospitalization (including imaging, microbiology, ancillary and laboratory) are listed below for reference.     Microbiology: Recent Results (from the past 240 hours)  Resp panel by RT-PCR (RSV, Flu A&B, Covid) Anterior Nasal Swab     Status: None   Collection Time: 05/03/24 11:38 PM   Specimen: Anterior Nasal Swab  Result Value Ref Range Status   SARS Coronavirus 2 by RT PCR NEGATIVE NEGATIVE Final    Comment: (NOTE) SARS-CoV-2 target nucleic acids are NOT DETECTED.  The SARS-CoV-2 RNA is generally detectable in upper respiratory specimens during the acute phase of infection. The lowest concentration of SARS-CoV-2 viral copies this assay can detect is 138 copies/mL. A negative result does not preclude SARS-Cov-2 infection and should not be used as the sole basis for treatment or other patient management decisions. A negative result may occur with  improper specimen collection/handling, submission of specimen other than nasopharyngeal swab, presence of viral mutation(s) within the areas targeted by this assay, and inadequate number of viral copies(<138 copies/mL). A negative result must be combined with clinical observations, patient history, and epidemiological information. The expected result is Negative.  Fact Sheet for Patients:  BloggerCourse.com  Fact Sheet for Healthcare Providers:  SeriousBroker.it  This test is no t yet approved or cleared by the [place]  FDA and  has been authorized for detection and/or diagnosis of SARS-CoV-2 by FDA under an Emergency Use Authorization (EUA). This EUA will remain  in effect (meaning this test can be used) for the duration of  the COVID-19 declaration under Section 564(b)(1) of the Act, 21 U.S.C.section 360bbb-3(b)(1), unless the authorization is terminated  or revoked sooner.       Influenza A by PCR NEGATIVE NEGATIVE Final   Influenza B by PCR NEGATIVE NEGATIVE Final    Comment: (NOTE) The Xpert Xpress SARS-CoV-2/FLU/RSV plus assay is intended as an aid in the diagnosis of influenza from Nasopharyngeal swab specimens and should not be used as a sole basis for treatment. Nasal washings and aspirates are unacceptable for Xpert Xpress SARS-CoV-2/FLU/RSV testing.  Fact Sheet for Patients: BloggerCourse.com  Fact Sheet for Healthcare Providers: SeriousBroker.it  This test is not yet approved or cleared by the [place]  FDA and has been authorized for detection and/or diagnosis of SARS-CoV-2 by FDA under an Emergency Use Authorization (EUA). This EUA will remain in effect (meaning this test can be used) for the duration of the COVID-19 declaration under Section 564(b)(1) of the Act, 21 U.S.C. section 360bbb-3(b)(1), unless the authorization is terminated or revoked.     Resp Syncytial Virus by PCR NEGATIVE NEGATIVE Final    Comment: (NOTE) Fact Sheet for Patients: BloggerCourse.com  Fact Sheet for Healthcare Providers: SeriousBroker.it  This test is not yet approved or cleared by the [place]  FDA and has been authorized for detection and/or diagnosis of SARS-CoV-2 by FDA under an Emergency Use Authorization (EUA). This EUA will remain in effect (meaning this test can be used) for the duration of the COVID-19 declaration under Section 564(b)(1) of the Act, 21 U.S.C. section 360bbb-3(b)(1), unless the authorization is terminated or revoked.  Performed at [facility], [address] [place], [place] [address]   Culture, blood (Routine x 2)     Status: Abnormal    Collection Time: 05/04/24 12:30 AM   Specimen: BLOOD  Result Value Ref Range Status   Specimen Description   Final    BLOOD LEFT ANTECUBITAL Performed at [facility], [address] [place], [place] [address]    Special Requests   Final    BOTTLES DRAWN AEROBIC AND ANAEROBIC Blood Culture adequate volume Performed at [facility], [address] [place], [place] [address]    Culture  Setup Time   Final    GRAM POSITIVE COCCI IN BOTH AEROBIC AND ANAEROBIC BOTTLES CRITICAL RESULT CALLED TO, READ BACK BY AND VERIFIED WITH: PHARMD E JACKSON 05/05/2024 @ 0255 BY AB Performed at [facility], [address], [place], [place] [address]    Culture GROUP B STREP(S.AGALACTIAE)ISOLATED (A)  Final   Report Status 05/08/2024 FINAL  Final   Organism ID, Bacteria GROUP B STREP(S.AGALACTIAE)ISOLATED  Final      Susceptibility   Group b strep(s.agalactiae)isolated - MIC*    CLINDAMYCIN  >=1 RESISTANT Resistant     AMPICILLIN <=0.25 SENSITIVE Sensitive     ERYTHROMYCIN >=8 RESISTANT Resistant     VANCOMYCIN  0.5 SENSITIVE Sensitive     CEFTRIAXONE  <=0.12 SENSITIVE Sensitive     LEVOFLOXACIN 1 SENSITIVE Sensitive     PENICILLIN  <=0.06 SENSITIVE Sensitive     * GROUP B STREP(S.AGALACTIAE)ISOLATED  Blood Culture ID Panel (Reflexed)     Status: Abnormal   Collection Time: 05/04/24 12:30 AM  Result Value Ref Range Status   Enterococcus faecalis NOT DETECTED NOT DETECTED Final   Enterococcus Faecium NOT DETECTED NOT DETECTED Final   Listeria monocytogenes NOT DETECTED NOT DETECTED Final   Staphylococcus species NOT DETECTED NOT DETECTED Final   Staphylococcus aureus (BCID) NOT DETECTED NOT DETECTED Final   Staphylococcus epidermidis NOT DETECTED NOT DETECTED Final   Staphylococcus lugdunensis NOT DETECTED NOT DETECTED Final   Streptococcus species DETECTED (A) NOT DETECTED Final    Comment: CRITICAL RESULT CALLED TO, READ BACK BY AND VERIFIED  WITH: PHARMD E JACKSON 05/05/2024 @ 0255 BY AB    Streptococcus agalactiae DETECTED (A) NOT DETECTED Final    Comment: CRITICAL RESULT CALLED TO, READ BACK BY AND VERIFIED WITH: PHARMD E JACKSON 05/05/2024 @ 0255 BY AB    Streptococcus pneumoniae NOT DETECTED NOT DETECTED Final   Streptococcus pyogenes NOT DETECTED NOT DETECTED Final   A.calcoaceticus-baumannii NOT DETECTED NOT DETECTED Final   Bacteroides fragilis NOT DETECTED NOT DETECTED Final   Enterobacterales NOT DETECTED NOT DETECTED Final   Enterobacter cloacae complex NOT DETECTED NOT DETECTED Final   Escherichia coli NOT DETECTED NOT DETECTED Final   Klebsiella aerogenes NOT DETECTED NOT DETECTED Final   Klebsiella oxytoca NOT DETECTED NOT DETECTED Final   Klebsiella pneumoniae NOT DETECTED NOT DETECTED Final   Proteus species NOT DETECTED NOT DETECTED Final   Salmonella species NOT DETECTED NOT DETECTED Final   Serratia marcescens NOT DETECTED NOT DETECTED Final   Haemophilus influenzae NOT DETECTED NOT DETECTED Final   Neisseria meningitidis NOT DETECTED NOT DETECTED Final   Pseudomonas aeruginosa NOT DETECTED NOT DETECTED Final   Stenotrophomonas maltophilia NOT DETECTED NOT DETECTED Final   Candida albicans NOT DETECTED NOT DETECTED Final   Candida auris NOT DETECTED NOT DETECTED Final   Candida glabrata NOT DETECTED NOT DETECTED Final   Candida krusei NOT DETECTED NOT DETECTED Final   Candida parapsilosis NOT DETECTED NOT DETECTED Final   Candida tropicalis NOT DETECTED NOT DETECTED Final   Cryptococcus neoformans/gattii NOT DETECTED NOT DETECTED Final    Comment: Performed at [facility], [address] [place], [place] [address]  MRSA Next Gen by PCR, Nasal     Status: None   Collection Time: 05/04/24  9:53 AM   Specimen: Nasal Mucosa; Nasal Swab  Result Value Ref Range Status   MRSA by PCR Next Gen NOT DETECTED NOT DETECTED Final    Comment: (NOTE) The GeneXpert MRSA Assay (FDA approved for NASAL  specimens only), is one component of a comprehensive MRSA colonization surveillance program. It is not intended to diagnose MRSA infection nor to guide or monitor treatment for MRSA infections. Test performance is not FDA approved in patients less than 54 years old. Performed at [facility], [address] [place], [place] [address]   Culture, blood (Routine x 2)     Status: None   Collection Time: 05/04/24 10:45 AM   Specimen: BLOOD LEFT HAND  Result Value Ref Range Status   Specimen Description   Final    BLOOD LEFT HAND Performed at [facility], [address], [place], [place] [address]    Special Requests   Final    BOTTLES DRAWN AEROBIC AND ANAEROBIC Blood Culture results may not be optimal due to an inadequate volume of blood received in culture bottles Performed at [facility], [address], [place], [place] [address]    Culture   Final    NO GROWTH 5 DAYS Performed at [facility], [address] [place], [place] [address]    Report Status 05/09/2024 FINAL  Final  Respiratory (~20 pathogens) panel by PCR     Status: None   Collection Time: 05/04/24  3:09 PM   Specimen: Nasopharyngeal Swab; Respiratory  Result Value Ref Range Status   Adenovirus NOT DETECTED NOT DETECTED Final   Coronavirus 229E NOT DETECTED NOT DETECTED Final    Comment: (NOTE) The Coronavirus on the Respiratory Panel, DOES NOT test for the novel  Coronavirus (2019 nCoV)    Coronavirus HKU1 NOT DETECTED NOT DETECTED Final   Coronavirus NL63 NOT DETECTED NOT DETECTED Final   Coronavirus OC43 NOT DETECTED NOT DETECTED Final   Metapneumovirus NOT DETECTED NOT DETECTED Final   Rhinovirus / Enterovirus NOT DETECTED NOT DETECTED Final   Influenza A NOT DETECTED NOT DETECTED Final   Influenza B NOT DETECTED NOT DETECTED Final   Parainfluenza Virus 1 NOT DETECTED NOT DETECTED Final   Parainfluenza Virus 2 NOT DETECTED NOT DETECTED Final    Parainfluenza Virus 3 NOT DETECTED NOT DETECTED Final   Parainfluenza Virus 4 NOT DETECTED NOT DETECTED Final   Respiratory Syncytial Virus NOT DETECTED NOT DETECTED Final   Bordetella pertussis NOT DETECTED NOT DETECTED Final   Bordetella Parapertussis NOT DETECTED NOT DETECTED Final   Chlamydophila pneumoniae NOT DETECTED NOT DETECTED Final   Mycoplasma pneumoniae NOT DETECTED NOT DETECTED Final    Comment: Performed at [facility], [address], [place], [place] [address]  Culture, blood (Routine X 2) w Reflex to ID Panel     Status: None (Preliminary result)   Collection Time: 05/05/24  1:10 PM   Specimen: BLOOD RIGHT HAND  Result Value Ref Range Status   Specimen Description   Final    BLOOD RIGHT HAND Performed at [facility], [address] [place], [place] [address]    Special Requests   Final    BOTTLES DRAWN AEROBIC AND ANAEROBIC Blood Culture results may not be optimal due to an inadequate volume of blood received in culture bottles Performed at [facility], [address] [place], [place] [address]    Culture   Final    NO GROWTH  4 DAYS Performed at [facility], [address] [place], [place] [address]    Report Status PENDING  Incomplete  Culture, blood (Routine X 2) w Reflex to ID Panel     Status: None (Preliminary result)   Collection Time: 05/05/24  1:10 PM   Specimen: BLOOD LEFT HAND  Result Value Ref Range Status   Specimen Description   Final    BLOOD LEFT HAND Performed at [facility], [address], [place], [place] [address]    Special Requests   Final    BOTTLES DRAWN AEROBIC ONLY Blood Culture results may not be optimal due to an inadequate volume of blood received in culture bottles Performed at [facility], [address], [place], [place] [address]    Culture   Final    NO GROWTH 4 DAYS Performed at [facility], [address], [place], [place] [address]    Report  Status PENDING  Incomplete     Labs: BNP (last 3 results) Recent Labs    04/18/24 1018 05/03/24 2338 05/04/24 0513  BNP 4,214.9* 3,479.7* >4,500.0*   Basic Metabolic Panel: Recent Labs  Lab 05/03/24 2338 05/04/24 0513 05/07/24 0521 05/09/24 0247  NA 139 138 135 132*  K 4.4 3.6 4.0 3.6  CL 99 98 100 97*  CO2 23 25 23 23   GLUCOSE 272* 212* 132* 174*  BUN 43* 45* 57* 58*  CREATININE 1.64* 1.71* 1.94* 1.73*  CALCIUM  9.4 9.0 8.6* 8.1*  MG  --  2.1  --   --    Liver Function Tests: Recent Labs  Lab 05/03/24 2338 05/04/24 0513  AST 34 36  ALT 16 17  ALKPHOS 118 100  BILITOT 1.3* 1.5*  PROT 6.5 5.8*  ALBUMIN  3.5 3.2*   No results for input(s): LIPASE, AMYLASE in the last 168 hours. No results for input(s): AMMONIA in the last 168 hours. CBC: Recent Labs  Lab 05/03/24 2338 05/04/24 0513 05/07/24 0521 05/09/24 0247  WBC 12.6* 14.9* 6.9 6.5  NEUTROABS 11.7* 13.6*  --   --   HGB 14.0 11.9* 12.5* 12.2*  HCT 44.8 37.7* 40.4 38.8*  MCV 90.5 88.9 93.3 90.2  PLT 222 162 157 149*   Cardiac Enzymes: No results for input(s): CKTOTAL, CKMB, CKMBINDEX, TROPONINI in the last 168 hours. BNP: Invalid input(s): POCBNP CBG: Recent Labs  Lab 05/08/24 1126 05/08/24 1611 05/08/24 2201 05/09/24 0742 05/09/24 1135  GLUCAP 268* 252* 198* 180* 211*   D-Dimer No results for input(s): DDIMER in the last 72 hours. Hgb A1c No results for input(s): HGBA1C in the last 72 hours. Lipid Profile No results for input(s): CHOL, HDL, LDLCALC, TRIG, CHOLHDL, LDLDIRECT in the last 72 hours. Thyroid  function studies No results for input(s): TSH, T4TOTAL, T3FREE, THYROIDAB in the last 72 hours.  Invalid input(s): FREET3 Anemia work up No results for input(s): VITAMINB12, FOLATE, FERRITIN, TIBC, IRON , RETICCTPCT in the last 72 hours. Urinalysis    Component Value Date/Time   COLORURINE YELLOW 05/03/2024 2338   APPEARANCEUR CLEAR  05/03/2024 2338   LABSPEC 1.014 05/03/2024 2338   PHURINE 5.0 05/03/2024 2338   GLUCOSEU NEGATIVE 05/03/2024 2338   HGBUR NEGATIVE 05/03/2024 2338   BILIRUBINUR NEGATIVE 05/03/2024 2338   BILIRUBINUR negative 06/26/2021 1300   BILIRUBINUR NEG 10/13/2012 0925   KETONESUR NEGATIVE 05/03/2024 2338   PROTEINUR NEGATIVE 05/03/2024 2338   UROBILINOGEN 0.2 06/26/2021 1300   NITRITE NEGATIVE 05/03/2024 2338   LEUKOCYTESUR NEGATIVE 05/03/2024 2338  Sepsis Labs Recent Labs  Lab 05/03/24 2338 05/04/24 0513 05/07/24 0521 05/09/24 0247  WBC 12.6* 14.9* 6.9 6.5   Microbiology Recent Results (from the past 240 hours)  Resp panel by RT-PCR (RSV, Flu A&B, Covid) Anterior Nasal Swab     Status: None   Collection Time: 05/03/24 11:38 PM   Specimen: Anterior Nasal Swab  Result Value Ref Range Status   SARS Coronavirus 2 by RT PCR NEGATIVE NEGATIVE Final    Comment: (NOTE) SARS-CoV-2 target nucleic acids are NOT DETECTED.  The SARS-CoV-2 RNA is generally detectable in upper respiratory specimens during the acute phase of infection. The lowest concentration of SARS-CoV-2 viral copies this assay can detect is 138 copies/mL. A negative result does not preclude SARS-Cov-2 infection and should not be used as the sole basis for treatment or other patient management decisions. A negative result may occur with  improper specimen collection/handling, submission of specimen other than nasopharyngeal swab, presence of viral mutation(s) within the areas targeted by this assay, and inadequate number of viral copies(<138 copies/mL). A negative result must be combined with clinical observations, patient history, and epidemiological information. The expected result is Negative.  Fact Sheet for Patients:  BloggerCourse.com  Fact Sheet for Healthcare Providers:  SeriousBroker.it  This test is no t yet approved or cleared by the [place]  FDA and   has been authorized for detection and/or diagnosis of SARS-CoV-2 by FDA under an Emergency Use Authorization (EUA). This EUA will remain  in effect (meaning this test can be used) for the duration of the COVID-19 declaration under Section 564(b)(1) of the Act, 21 U.S.C.section 360bbb-3(b)(1), unless the authorization is terminated  or revoked sooner.       Influenza A by PCR NEGATIVE NEGATIVE Final   Influenza B by PCR NEGATIVE NEGATIVE Final    Comment: (NOTE) The Xpert Xpress SARS-CoV-2/FLU/RSV plus assay is intended as an aid in the diagnosis of influenza from Nasopharyngeal swab specimens and should not be used as a sole basis for treatment. Nasal washings and aspirates are unacceptable for Xpert Xpress SARS-CoV-2/FLU/RSV testing.  Fact Sheet for Patients: BloggerCourse.com  Fact Sheet for Healthcare Providers: SeriousBroker.it  This test is not yet approved or cleared by the [place]  FDA and has been authorized for detection and/or diagnosis of SARS-CoV-2 by FDA under an Emergency Use Authorization (EUA). This EUA will remain in effect (meaning this test can be used) for the duration of the COVID-19 declaration under Section 564(b)(1) of the Act, 21 U.S.C. section 360bbb-3(b)(1), unless the authorization is terminated or revoked.     Resp Syncytial Virus by PCR NEGATIVE NEGATIVE Final    Comment: (NOTE) Fact Sheet for Patients: BloggerCourse.com  Fact Sheet for Healthcare Providers: SeriousBroker.it  This test is not yet approved or cleared by the [place]  FDA and has been authorized for detection and/or diagnosis of SARS-CoV-2 by FDA under an Emergency Use Authorization (EUA). This EUA will remain in effect (meaning this test can be used) for the duration of the COVID-19 declaration under Section 564(b)(1) of the Act, 21 U.S.C. section 360bbb-3(b)(1),  unless the authorization is terminated or revoked.  Performed at [facility], [address] [place], [place] [address]   Culture, blood (Routine x 2)     Status: Abnormal   Collection Time: 05/04/24 12:30 AM   Specimen: BLOOD  Result Value Ref Range Status   Specimen Description   Final    BLOOD LEFT ANTECUBITAL Performed at [facility], [address] [place], [place] [address]    Special Requests   Final    BOTTLES DRAWN AEROBIC AND ANAEROBIC Blood Culture adequate volume Performed at [facility], [address], [place], [place] [address]    Culture  Setup Time   Final    GRAM POSITIVE COCCI IN BOTH AEROBIC AND ANAEROBIC BOTTLES CRITICAL RESULT CALLED TO, READ BACK BY AND VERIFIED WITH: PHARMD E JACKSON 05/05/2024 @ 0255 BY AB Performed at [facility], [address] [place], [place] [address]    Culture GROUP B STREP(S.AGALACTIAE)ISOLATED (A)  Final   Report Status 05/08/2024 FINAL  Final   Organism ID, Bacteria GROUP B STREP(S.AGALACTIAE)ISOLATED  Final      Susceptibility   Group b strep(s.agalactiae)isolated - MIC*    CLINDAMYCIN  >=1 RESISTANT Resistant     AMPICILLIN <=0.25 SENSITIVE Sensitive     ERYTHROMYCIN >=8 RESISTANT Resistant     VANCOMYCIN  0.5 SENSITIVE Sensitive     CEFTRIAXONE  <=0.12 SENSITIVE Sensitive     LEVOFLOXACIN 1 SENSITIVE Sensitive     PENICILLIN  <=0.06 SENSITIVE Sensitive     * GROUP B STREP(S.AGALACTIAE)ISOLATED  Blood Culture ID Panel (Reflexed)     Status: Abnormal   Collection Time: 05/04/24 12:30 AM  Result Value Ref Range Status   Enterococcus faecalis NOT DETECTED NOT DETECTED Final   Enterococcus Faecium NOT DETECTED NOT DETECTED Final   Listeria monocytogenes NOT DETECTED NOT DETECTED Final   Staphylococcus species NOT DETECTED NOT DETECTED Final   Staphylococcus aureus (BCID) NOT DETECTED NOT DETECTED Final   Staphylococcus epidermidis NOT DETECTED NOT DETECTED  Final   Staphylococcus lugdunensis NOT DETECTED NOT DETECTED Final   Streptococcus species DETECTED (A) NOT DETECTED Final    Comment: CRITICAL RESULT CALLED TO, READ BACK BY AND VERIFIED WITH: PHARMD E JACKSON 05/05/2024 @ 0255 BY AB    Streptococcus agalactiae DETECTED (A) NOT DETECTED Final    Comment: CRITICAL RESULT CALLED TO, READ BACK BY AND VERIFIED WITH: PHARMD E JACKSON 05/05/2024 @ 0255 BY AB    Streptococcus pneumoniae NOT DETECTED NOT DETECTED Final   Streptococcus pyogenes NOT DETECTED NOT DETECTED Final   A.calcoaceticus-baumannii NOT DETECTED NOT DETECTED Final   Bacteroides fragilis NOT DETECTED NOT DETECTED Final   Enterobacterales NOT DETECTED NOT DETECTED Final   Enterobacter cloacae complex NOT DETECTED NOT DETECTED Final   Escherichia coli NOT DETECTED NOT DETECTED Final   Klebsiella aerogenes NOT DETECTED NOT DETECTED Final   Klebsiella oxytoca NOT DETECTED NOT DETECTED Final   Klebsiella pneumoniae NOT DETECTED NOT DETECTED Final   Proteus species NOT DETECTED NOT DETECTED Final   Salmonella species NOT DETECTED NOT DETECTED Final   Serratia marcescens NOT DETECTED NOT DETECTED Final   Haemophilus influenzae NOT DETECTED NOT DETECTED Final   Neisseria meningitidis NOT DETECTED NOT DETECTED Final   Pseudomonas aeruginosa NOT DETECTED NOT DETECTED Final   Stenotrophomonas maltophilia NOT DETECTED NOT DETECTED Final   Candida albicans NOT DETECTED NOT DETECTED Final   Candida auris NOT DETECTED NOT DETECTED Final   Candida glabrata NOT DETECTED NOT DETECTED Final   Candida krusei NOT DETECTED NOT DETECTED Final   Candida parapsilosis NOT DETECTED NOT DETECTED Final   Candida tropicalis NOT DETECTED NOT DETECTED Final   Cryptococcus neoformans/gattii NOT DETECTED NOT DETECTED Final    Comment: Performed at [facility], [address], [place], [place] [address]  MRSA Next Gen by PCR, Nasal     Status: None   Collection Time: 05/04/24  9:53 AM   Specimen:  Nasal Mucosa; Nasal Swab  Result Value Ref Range Status   MRSA by PCR Next Gen NOT DETECTED NOT DETECTED Final    Comment: (NOTE) The GeneXpert MRSA Assay (FDA approved for NASAL specimens only), is one component of a comprehensive MRSA colonization surveillance program. It is not intended to diagnose MRSA infection nor to guide or monitor treatment for MRSA infections. Test performance is not FDA approved in patients less than 45 years old. Performed at [facility], [address], [place], [place] [address]   Culture, blood (Routine x 2)     Status: None   Collection Time: 05/04/24 10:45 AM   Specimen: BLOOD LEFT HAND  Result Value Ref Range Status   Specimen Description   Final    BLOOD LEFT HAND Performed at [facility], [address] [place], [place] [address]    Special Requests   Final    BOTTLES DRAWN AEROBIC AND ANAEROBIC Blood Culture results may not be optimal due to an inadequate volume of blood received in culture bottles Performed at [facility], [address] [place], [place] [address]    Culture   Final    NO GROWTH 5 DAYS Performed at [facility], [address], [place], [place] [address]    Report Status 05/09/2024 FINAL  Final  Respiratory (~20 pathogens) panel by PCR     Status: None   Collection Time: 05/04/24  3:09 PM   Specimen: Nasopharyngeal Swab; Respiratory  Result Value Ref Range Status   Adenovirus NOT DETECTED NOT DETECTED Final   Coronavirus 229E NOT DETECTED NOT DETECTED Final    Comment: (NOTE) The Coronavirus on the Respiratory Panel, DOES NOT test for the novel  Coronavirus (2019 nCoV)    Coronavirus HKU1 NOT DETECTED NOT DETECTED Final   Coronavirus NL63 NOT DETECTED NOT DETECTED Final   Coronavirus OC43 NOT DETECTED NOT DETECTED Final   Metapneumovirus NOT DETECTED NOT DETECTED Final   Rhinovirus / Enterovirus NOT DETECTED NOT DETECTED Final   Influenza A NOT DETECTED NOT  DETECTED Final   Influenza B NOT DETECTED NOT DETECTED Final   Parainfluenza Virus 1 NOT DETECTED NOT DETECTED Final   Parainfluenza Virus 2 NOT DETECTED NOT DETECTED Final   Parainfluenza Virus 3 NOT DETECTED NOT DETECTED Final   Parainfluenza Virus 4 NOT DETECTED NOT DETECTED Final   Respiratory Syncytial Virus NOT DETECTED NOT DETECTED Final   Bordetella pertussis NOT DETECTED NOT DETECTED Final   Bordetella Parapertussis NOT DETECTED NOT DETECTED Final   Chlamydophila pneumoniae NOT DETECTED NOT DETECTED Final   Mycoplasma pneumoniae NOT DETECTED NOT DETECTED Final    Comment: Performed at [facility], [address] [place], [place] [address]  Culture, blood (Routine X 2) w Reflex to ID Panel     Status: None (Preliminary result)   Collection Time: 05/05/24  1:10 PM   Specimen: BLOOD RIGHT HAND  Result Value Ref Range Status   Specimen Description   Final    BLOOD RIGHT HAND Performed at [facility], [address] [place], [place] [address]    Special Requests   Final    BOTTLES DRAWN AEROBIC AND ANAEROBIC Blood Culture results may not be optimal due to an inadequate volume of blood received in culture bottles Performed at [facility], [address] [place], [place] [address]    Culture   Final    NO GROWTH  4 DAYS Performed at [facility], [address], [place], [place] [address]    Report Status PENDING  Incomplete  Culture, blood (Routine X 2) w Reflex to ID Panel     Status: None (Preliminary result)   Collection Time: 05/05/24  1:10 PM   Specimen: BLOOD LEFT HAND  Result Value Ref Range Status   Specimen Description   Final    BLOOD LEFT HAND Performed at [facility], [address] [place], [place] [address]    Special Requests   Final    BOTTLES DRAWN AEROBIC ONLY Blood Culture results may not be optimal due to an inadequate volume of blood received in culture bottles Performed at [facility],  [address] [place], [place] [address]    Culture   Final    NO GROWTH 4 DAYS Performed at [facility], [address], [place], [place] [address]    Report Status PENDING  Incomplete     Time coordinating discharge: Over 30 minutes  SIGNED:   Elsie JAYSON [place], DO [facility] 05/09/2024, 1:49 PM Pager   If 7PM-7AM, please contact night-coverage [email or web address]

## 2024-05-09 NOTE — TOC Transition Note (Signed)
 Transition of Care [facility]) - Discharge Note   Patient Details  Name: Eduardo [name] MRN: [id number] Date of Birth: Jan 02, 1956  Transition of Care [facility]) CM/SW Contact:  Jon ONEIDA Anon, RN Phone Number: 05/09/2024, 1:59 PM   Clinical Narrative:    Pt will discharge home. Pt aware and agreeable with the discharge plan.  [facility] will provide HH RN/PT/OT services once pt is home. HH orders are in place. Pam with [facility] completing PICC teaching. Pt will receive Outpatient Palliative Care services with [facility] at discharge. Pt SO will provide transportation at discharge. There are no DME needs. There are no other TOC needs at this time.   Final next level of care: Home w Home Health Services (Outpatient Palliative care services) Barriers to Discharge: No Barriers Identified   Patient Goals and CMS Choice Patient states their goals for this hospitalization and ongoing recovery are:: Home with Home health/ Palliative care CMS Medicare.gov Compare Post Acute Care list provided to:: Patient Choice offered to / list presented to : Patient [place] ownership interest in [facility].provided to:: Patient    Discharge Placement                       Discharge Plan and Services Additional resources added to the After Visit Summary for   In-house Referral: Clinical Social Work, Hospice / Palliative Care Discharge Planning Services: CM Consult Post Acute Care Choice: Home Health          DME Arranged: N/A DME Agency: NA       HH Arranged: RN, PT, OT HH Agency: [facility] Date HH Agency Contacted: 05/09/24 Time HH Agency Contacted: [id number] Representative spoke with at [facility] Agency: Engineer, materials at [employer] [phone]  Social Drivers of Health (SDOH) Interventions SDOH Screenings   Food Insecurity: No Food Insecurity (05/05/2024)  Housing: Low Risk  (05/05/2024)  Transportation Needs: No Transportation Needs (05/05/2024)  Utilities: Not  At Risk (05/05/2024)  Depression (PHQ2-9): Low Risk  (02/22/2024)  Financial Resource Strain: Low Risk  (07/28/2023)  Physical Activity: Sufficiently Active (07/28/2023)  Social Connections: Socially Isolated (05/05/2024)  Stress: No Stress Concern Present (07/28/2023)  Tobacco Use: Medium Risk (05/03/2024)     Readmission Risk Interventions    05/08/2024    1:41 PM 03/28/2024   10:33 AM  Readmission Risk Prevention Plan  Transportation Screening Complete Complete  Home Care Screening  Complete  Medication Review (RN CM)  Complete  Medication Review (RN Care Manager) Complete   PCP or Specialist appointment within 3-5 days of discharge Complete   HRI or Home Care Consult Complete   SW Recovery Care/Counseling Consult Complete   Palliative Care Screening Complete   Skilled Nursing Facility Not Applicable

## 2024-05-09 NOTE — Evaluation (Signed)
 Physical Therapy Evaluation Patient Details Name: Eduardo [name] MRN: [id number] DOB: 08-Aug-1956 Today's Date: 05/09/2024  History of Present Illness  TAO SATZ is a 68 y.o. male with medical history significant of chronic HFrEF s/p ICD implantation, multivessel CAD, HTN, R BKA,  recent diagnosis of cholangiocarcinoma, PAD, type 2 diabetes, Stage 2-3 CKD presenting with sepsis secondary to GBS bacteremia.  Clinical Impression    Pt admitted with above diagnosis.  Pt currently with functional limitations due to the deficits listed below (see PT Problem List). Pt in bed when therapist arrived. Pt reported feeling fatigued, however willing to participate. Pt reports no pain, dizziness nor SOB. Pt on 2 L/min and 99-100% at rest no supplemental O2 in home setting, [place] tube doffed and pt maintained 99% at rest, O2 monitored throughout therapy intervention and 90-99% with pt 94-96% with gait tasks. Pt required min A for trunk for supine to sit with HOB elevated, CGA for sit to stand  from EOB, pt able to don R prosthetic seated EOB, pt required CGA for gait tasks 110 feet with RW, CGA, R prosthetic donned and min cues. Pt left seated in recliner and all needs in place. Pt will benefit from acute skilled PT to increase their independence and safety with mobility to allow discharge.         If plan is discharge home, recommend the following: A little help with walking and/or transfers;A little help with bathing/dressing/bathroom;Assistance with cooking/housework;Assist for transportation   Can travel by private vehicle        Equipment Recommendations None recommended by PT  Recommendations for Other Services       Functional Status Assessment Patient has had a recent decline in their functional status and demonstrates the ability to make significant improvements in function in a reasonable and predictable amount of time.     Precautions / Restrictions Precautions Precautions:  Fall Restrictions Weight Bearing Restrictions Per Provider Order: No      Mobility  Bed Mobility Overal bed mobility: Needs Assistance Bed Mobility: Supine to Sit     Supine to sit: Min assist, HOB elevated     General bed mobility comments: cues and min A for trunk    Transfers Overall transfer level: Needs assistance Equipment used: Rolling walker (2 wheels) Transfers: Sit to/from Stand Sit to Stand: Contact guard assist           General transfer comment: min cues and sit to stand x 3 from EOB to ensure proper R prosthetic seating    Ambulation/Gait Ambulation/Gait assistance: Contact guard assist Gait Distance (Feet): 110 Feet Assistive device: Rolling walker (2 wheels) Gait Pattern/deviations: Step-through pattern, Trunk flexed Gait velocity: decreased     General Gait Details: slight trunk flexion, B UE support at RW, R prosthetic donned and min cues  Stairs            Wheelchair Mobility     Tilt Bed    Modified Rankin (Stroke Patients Only)       Balance Overall balance assessment: Needs assistance Sitting-balance support: Feet supported Sitting balance-Leahy Scale: Good     Standing balance support: Bilateral upper extremity supported, During functional activity, Reliant on assistive device for balance Standing balance-Leahy Scale: Fair                               Pertinent Vitals/Pain Pain Assessment Pain Assessment: No/denies pain    Home Living Family/patient expects  to be discharged to:: Private residence Living Arrangements: Spouse/significant other Available Help at Discharge: Family Type of Home: House Home Access: Ramped entrance       Home Layout: One level Home Equipment: Agricultural consultant (2 wheels);Rollator (4 wheels);Wheelchair - manual;BSC/3in1;Shower seat;Grab bars - toilet Additional Comments: not using O2 at baseline    Prior Function Prior Level of Function : Independent/Modified Independent              Mobility Comments: walks without AD, drives, uses WC at night for trips to bathroom 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, independent with ADLs     Extremity/Trunk Assessment        Lower Extremity Assessment Lower Extremity Assessment: Generalized weakness    Cervical / Trunk Assessment Cervical / Trunk Assessment: Normal  Communication   Communication Communication: No apparent difficulties    Cognition Arousal: Alert Behavior During Therapy: WFL for tasks assessed/performed   PT - Cognitive impairments: No apparent impairments                         Following commands: Intact       Cueing       General Comments General comments (skin integrity, edema, etc.): noted L distal LE edema and L UE edema    Exercises     Assessment/Plan    PT Assessment    PT Problem List         PT Treatment Interventions      PT Goals (Current goals can be found in the Care Plan section)  Acute Rehab PT Goals Patient Stated Goal: to go home and enjoy what time I have left PT Goal Formulation: With patient Time For Goal Achievement: 05/23/24 Potential to Achieve Goals: Good    Frequency       Co-evaluation PT/OT/SLP Co-Evaluation/Treatment: Yes Reason for Co-Treatment: Complexity of the patient's impairments (multi-system involvement);Necessary to address cognition/behavior during functional activity;For patient/therapist safety PT goals addressed during session: Mobility/safety with mobility;Balance;Proper use of DME OT goals addressed during session: ADL's and self-care;Proper use of [facility] equipment and DME       AM-PAC PT 6 Clicks Mobility  Outcome Measure Help needed turning from your back to your side while in a flat bed without using bedrails?: None Help needed moving from lying on your back to sitting on the side of a flat bed without using bedrails?: A Little Help needed moving to and from a bed to a chair (including a  wheelchair)?: A Little Help needed standing up from a chair using your arms (e.g., wheelchair or bedside chair)?: A Little Help needed to walk in hospital room?: A Little Help needed climbing 3-5 steps with a railing? : Total 6 Click Score: 17    End of Session Equipment Utilized During Treatment: Gait belt;Other (comment) (R prosthetic) Activity Tolerance: Patient limited by fatigue Patient left: in chair;with call bell/phone within reach;with chair alarm set   PT Visit Diagnosis: Unsteadiness on feet (R26.81);Other abnormalities of gait and mobility (R26.89);Muscle weakness (generalized) (M62.81);Difficulty in walking, not elsewhere classified (R26.2)    Time: 8977-8943 PT Time Calculation (min) (ACUTE ONLY): 34 min   Charges:   PT Evaluation $PT Eval Low Complexity: 1 Low PT Treatments $Gait Training: 8-22 mins PT General Charges $$ ACUTE PT VISIT: 1 Visit         [place], PT Acute Rehab   [place] VEAR Drone 05/09/2024, 11:41 AM

## 2024-05-10 ENCOUNTER — Telehealth: Payer: Self-pay

## 2024-05-10 DIAGNOSIS — I330 Acute and subacute infective endocarditis: Secondary | ICD-10-CM | POA: Diagnosis not present

## 2024-05-10 DIAGNOSIS — G8929 Other chronic pain: Secondary | ICD-10-CM | POA: Diagnosis not present

## 2024-05-10 DIAGNOSIS — A419 Sepsis, unspecified organism: Secondary | ICD-10-CM | POA: Diagnosis not present

## 2024-05-10 DIAGNOSIS — E1122 Type 2 diabetes mellitus with diabetic chronic kidney disease: Secondary | ICD-10-CM | POA: Diagnosis not present

## 2024-05-10 DIAGNOSIS — E1140 Type 2 diabetes mellitus with diabetic neuropathy, unspecified: Secondary | ICD-10-CM | POA: Diagnosis not present

## 2024-05-10 DIAGNOSIS — E11319 Type 2 diabetes mellitus with unspecified diabetic retinopathy without macular edema: Secondary | ICD-10-CM | POA: Diagnosis not present

## 2024-05-10 DIAGNOSIS — Z95810 Presence of automatic (implantable) cardiac defibrillator: Secondary | ICD-10-CM | POA: Diagnosis not present

## 2024-05-10 DIAGNOSIS — I429 Cardiomyopathy, unspecified: Secondary | ICD-10-CM | POA: Diagnosis not present

## 2024-05-10 DIAGNOSIS — Z792 Long term (current) use of antibiotics: Secondary | ICD-10-CM | POA: Diagnosis not present

## 2024-05-10 DIAGNOSIS — Z452 Encounter for adjustment and management of vascular access device: Secondary | ICD-10-CM | POA: Diagnosis not present

## 2024-05-10 DIAGNOSIS — Z6823 Body mass index (BMI) 23.0-23.9, adult: Secondary | ICD-10-CM | POA: Diagnosis not present

## 2024-05-10 DIAGNOSIS — I5022 Chronic systolic (congestive) heart failure: Secondary | ICD-10-CM | POA: Diagnosis not present

## 2024-05-10 DIAGNOSIS — Z794 Long term (current) use of insulin: Secondary | ICD-10-CM | POA: Diagnosis not present

## 2024-05-10 DIAGNOSIS — E1151 Type 2 diabetes mellitus with diabetic peripheral angiopathy without gangrene: Secondary | ICD-10-CM | POA: Diagnosis not present

## 2024-05-10 DIAGNOSIS — C221 Intrahepatic bile duct carcinoma: Secondary | ICD-10-CM | POA: Diagnosis not present

## 2024-05-10 DIAGNOSIS — A401 Sepsis due to streptococcus, group B: Secondary | ICD-10-CM | POA: Diagnosis not present

## 2024-05-10 DIAGNOSIS — I130 Hypertensive heart and chronic kidney disease with heart failure and stage 1 through stage 4 chronic kidney disease, or unspecified chronic kidney disease: Secondary | ICD-10-CM | POA: Diagnosis not present

## 2024-05-10 DIAGNOSIS — R6520 Severe sepsis without septic shock: Secondary | ICD-10-CM | POA: Diagnosis not present

## 2024-05-10 DIAGNOSIS — Z7902 Long term (current) use of antithrombotics/antiplatelets: Secondary | ICD-10-CM | POA: Diagnosis not present

## 2024-05-10 DIAGNOSIS — I50812 Chronic right heart failure: Secondary | ICD-10-CM | POA: Diagnosis not present

## 2024-05-10 DIAGNOSIS — E7800 Pure hypercholesterolemia, unspecified: Secondary | ICD-10-CM | POA: Diagnosis not present

## 2024-05-10 DIAGNOSIS — N1832 Chronic kidney disease, stage 3b: Secondary | ICD-10-CM | POA: Diagnosis not present

## 2024-05-10 DIAGNOSIS — I447 Left bundle-branch block, unspecified: Secondary | ICD-10-CM | POA: Diagnosis not present

## 2024-05-10 DIAGNOSIS — I472 Ventricular tachycardia, unspecified: Secondary | ICD-10-CM | POA: Diagnosis not present

## 2024-05-10 LAB — CULTURE, BLOOD (ROUTINE X 2)
Culture: NO GROWTH
Culture: NO GROWTH

## 2024-05-10 NOTE — Transitions of Care (Post Inpatient/ED Visit) (Signed)
   05/10/2024  Name: Eduardo [name] MRN: [id number] DOB: [birth year]  Today's TOC FU Call Status: Today's TOC FU Call Status:: Unsuccessful Call (1st Attempt) Unsuccessful Call (1st Attempt) Date: 05/10/24  Attempted to reach the patient regarding the most recent Inpatient/ED visit.  Follow Up Plan: Additional outreach attempts will be made to reach the patient to complete the Transitions of Care (Post Inpatient/ED visit) call.    Bing Edison MSN, RN RN Case Sales executive Health  [facility] Office Hours M-F [phone] Direct Dial: [phone] Main Phone [phone]  Fax: [phone] [email or web address]

## 2024-05-11 ENCOUNTER — Telehealth: Payer: Self-pay

## 2024-05-11 ENCOUNTER — Inpatient Hospital Stay

## 2024-05-11 ENCOUNTER — Other Ambulatory Visit (HOSPITAL_BASED_OUTPATIENT_CLINIC_OR_DEPARTMENT_OTHER): Payer: Self-pay

## 2024-05-11 ENCOUNTER — Inpatient Hospital Stay: Admitting: Nurse Practitioner

## 2024-05-11 ENCOUNTER — Inpatient Hospital Stay: Attending: Nurse Practitioner | Admitting: Nurse Practitioner

## 2024-05-11 ENCOUNTER — Encounter: Payer: Self-pay | Admitting: Nurse Practitioner

## 2024-05-11 VITALS — BP 99/63 | HR 88 | Temp 97.8°F | Resp 18 | Ht 69.0 in | Wt 220.3 lb

## 2024-05-11 DIAGNOSIS — R160 Hepatomegaly, not elsewhere classified: Secondary | ICD-10-CM

## 2024-05-11 DIAGNOSIS — I255 Ischemic cardiomyopathy: Secondary | ICD-10-CM | POA: Diagnosis not present

## 2024-05-11 DIAGNOSIS — N189 Chronic kidney disease, unspecified: Secondary | ICD-10-CM | POA: Insufficient documentation

## 2024-05-11 DIAGNOSIS — I130 Hypertensive heart and chronic kidney disease with heart failure and stage 1 through stage 4 chronic kidney disease, or unspecified chronic kidney disease: Secondary | ICD-10-CM | POA: Diagnosis not present

## 2024-05-11 DIAGNOSIS — B951 Streptococcus, group B, as the cause of diseases classified elsewhere: Secondary | ICD-10-CM | POA: Diagnosis not present

## 2024-05-11 DIAGNOSIS — R7881 Bacteremia: Secondary | ICD-10-CM | POA: Insufficient documentation

## 2024-05-11 DIAGNOSIS — E1122 Type 2 diabetes mellitus with diabetic chronic kidney disease: Secondary | ICD-10-CM | POA: Insufficient documentation

## 2024-05-11 DIAGNOSIS — Z452 Encounter for adjustment and management of vascular access device: Secondary | ICD-10-CM | POA: Insufficient documentation

## 2024-05-11 DIAGNOSIS — C221 Intrahepatic bile duct carcinoma: Secondary | ICD-10-CM | POA: Diagnosis not present

## 2024-05-11 DIAGNOSIS — E1140 Type 2 diabetes mellitus with diabetic neuropathy, unspecified: Secondary | ICD-10-CM | POA: Insufficient documentation

## 2024-05-11 DIAGNOSIS — I2510 Atherosclerotic heart disease of native coronary artery without angina pectoris: Secondary | ICD-10-CM | POA: Insufficient documentation

## 2024-05-11 MED ORDER — PROCHLORPERAZINE MALEATE 5 MG PO TABS
5.0000 mg | ORAL_TABLET | Freq: Three times a day (TID) | ORAL | 0 refills | Status: DC | PRN
  Filled 2024-05-11: qty 30, 10d supply, fill #0

## 2024-05-11 NOTE — Transitions of Care (Post Inpatient/ED Visit) (Signed)
   05/11/2024  Name: Eduardo [name] MRN: [id number] DOB: 12-06-1955  Today's TOC FU Call Status: Today's TOC FU Call Status:: Unsuccessful Call (2nd Attempt) Unsuccessful Call (2nd Attempt) Date: 05/11/24  Attempted to reach the patient regarding the most recent Inpatient/ED visit.  Follow Up Plan: Additional outreach attempts will be made to reach the patient to complete the Transitions of Care (Post Inpatient/ED visit) call.    Bing Edison MSN, RN RN Case Sales executive Health  [facility] Office Hours M-F [phone] Direct Dial: [phone] Main Phone [phone]  Fax: [phone] [email or web address]

## 2024-05-11 NOTE — Progress Notes (Signed)
 [facility] OFFICE PROGRESS NOTE   Diagnosis: Cholangiocarcinoma  INTERVAL HISTORY:   Eduardo [name] returns as scheduled.  He was hospitalized 05/03/2024 through 05/09/2024 with septic shock/group B strep bacteremia.  He was treated as presumed positive endocarditis given inability to perform imaging to rule out endocarditis.  He is completing a course of ceftriaxone .  He reports that he feels lousy.  He is fatigued.  Has intermittent nausea.  Appetite is poor.  Intermittent loose stools.  No shortness of breath.  No fever.  No abdominal pain.  Objective:  Vital signs in last 24 hours:  Blood pressure 99/63, pulse 88, temperature 97.8 F (36.6 C), temperature source Temporal, resp. rate 18, height 5' 9 (1.753 m), weight 220 lb 4.8 oz (99.9 kg), SpO2 100%.   Per his request examined in the chair.   Resp: Lungs clear bilaterally. Cardio: Regular rate and rhythm. GI: Abdomen soft and nontender. Vascular: Pitting edema left lower leg. Neuro: Alert and oriented. Right upper extremity PICC without erythema.  Lab Results:  Lab Results  Component Value Date   WBC 6.5 05/09/2024   HGB 12.2 (L) 05/09/2024   HCT 38.8 (L) 05/09/2024   MCV 90.2 05/09/2024   PLT 149 (L) 05/09/2024   NEUTROABS 13.6 (H) 05/04/2024    Imaging:  No results found.  Medications: I have reviewed the patient's current medications.  Assessment/Plan: Cholangiocarcinoma CT abdomen/pelvis 11/14/2023-closed-loop small bowel obstruction, 8.8 cm hypoattenuating lesion in the posterior right liver with suggestion of nodular peripheral enhancement, small bilateral pleural effusions MRI abdomen 01/27/2024-rim-enhancing centrally hypoattenuating mass in the posterior right liver segment 7 measuring 11 x 8.4 cm, increased in size compared to the 11/14/2023 CT consistent with a necrotic metastasis or primary liver mass, severe anasarca, small volume ascites, bilateral pleural effusions, mild splenomegaly CT  abdomen/pelvis 02/14/2024-no change in complex right liver lesion, small bilateral pleural effusions left greater than right, soft tissue edema of the anterior abdominal wall Biopsy liver lesion 03/06/2024-benign hepatic parenchyma with reactive changes CT chest 03/09/2024-a few scattered small lung nodules measuring up to 4 mm right lower and left upper lobes.  Mild cardiomegaly.  Stable right and moderate left pleural effusions.  Anasarca.  Similar large hypodense mass filling the posterior right hepatic lobe. 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  Disease appears to be localized to a right liver mass.  He understands and agrees with [name] recommendation for treatment with an FGFR inhibitor.  We reviewed potential side effects including hyperphosphatemia, ocular toxicity, diarrhea, hematologic toxicity.  He was provided printed information on pemigatinib.    He is recovering from the recent  hospitalization with GBS bacteremia.  He is completing a course of outpatient ceftriaxone .  He has follow-up with [name], infectious disease on 05/22/2024.  We will see  him back within a few days of that visit to discuss timing to initiate therapy.  He understands he will need a baseline ophthalmology exam and routine follow-up every 2 months for the first 6 months then every 3 months thereafter.  Referral placed to [name], ophthalmology.  He will return for follow-up 05/23/2024.  We are available to see him sooner if needed.  Patient seen with [name].  [name] ANP/GNP-BC   05/11/2024  11:12 AM   This was a shared visit with [name].  Eduardo [name] was admitted last week with group B Streptococcus bacteremia..  No clear source for infection was identified, though bacteremia related to the liver mass is suspected.  He is completing an outpatient course of antibiotics. His case was presented at the GI tumor conference 04/26/2024.  Eduardo [name] is not a candidate for surgery or hepatic embolization.  Treatment with systemic therapy is recommended.  He may be a candidate for external radiation if the tumor decreases in size. The tumor has an FGFR fusion.  I recommend treatment with Pemigatinib.  He was given reading materials on pemigatinib today.  We reviewed potential toxicities associated with treatment including electrolyte abnormalities, hematologic toxicity, diarrhea, wound ocular toxicity. He is scheduled to see [name] on 05/22/2024.  He will return for an office visit here 05/23/2024.  He will begin treatment with pemigatinib after the next office visit if [name] is in agreement.  He understands the goal of treatment is to palliate symptoms and potentially prolong survival.  It is unclear whether he is current malaise is related to heart failure, bacteremia, or the liver mass.  I was present for greater than 50% of today's visit.  I performed Medical Decision Making.  [name], MD

## 2024-05-15 ENCOUNTER — Encounter: Payer: Self-pay | Admitting: Internal Medicine

## 2024-05-15 ENCOUNTER — Encounter: Payer: Self-pay | Admitting: *Deleted

## 2024-05-15 ENCOUNTER — Telehealth (HOSPITAL_COMMUNITY): Payer: Self-pay | Admitting: *Deleted

## 2024-05-15 ENCOUNTER — Telehealth: Payer: Self-pay | Admitting: Internal Medicine

## 2024-05-15 DIAGNOSIS — Z452 Encounter for adjustment and management of vascular access device: Secondary | ICD-10-CM | POA: Diagnosis not present

## 2024-05-15 DIAGNOSIS — Z794 Long term (current) use of insulin: Secondary | ICD-10-CM | POA: Diagnosis not present

## 2024-05-15 DIAGNOSIS — I50812 Chronic right heart failure: Secondary | ICD-10-CM | POA: Diagnosis not present

## 2024-05-15 DIAGNOSIS — G8929 Other chronic pain: Secondary | ICD-10-CM | POA: Diagnosis not present

## 2024-05-15 DIAGNOSIS — I330 Acute and subacute infective endocarditis: Secondary | ICD-10-CM | POA: Diagnosis not present

## 2024-05-15 DIAGNOSIS — Z6823 Body mass index (BMI) 23.0-23.9, adult: Secondary | ICD-10-CM | POA: Diagnosis not present

## 2024-05-15 DIAGNOSIS — I447 Left bundle-branch block, unspecified: Secondary | ICD-10-CM | POA: Diagnosis not present

## 2024-05-15 DIAGNOSIS — E1140 Type 2 diabetes mellitus with diabetic neuropathy, unspecified: Secondary | ICD-10-CM | POA: Diagnosis not present

## 2024-05-15 DIAGNOSIS — A401 Sepsis due to streptococcus, group B: Secondary | ICD-10-CM | POA: Diagnosis not present

## 2024-05-15 DIAGNOSIS — Z7902 Long term (current) use of antithrombotics/antiplatelets: Secondary | ICD-10-CM | POA: Diagnosis not present

## 2024-05-15 DIAGNOSIS — E1122 Type 2 diabetes mellitus with diabetic chronic kidney disease: Secondary | ICD-10-CM | POA: Diagnosis not present

## 2024-05-15 DIAGNOSIS — N1832 Chronic kidney disease, stage 3b: Secondary | ICD-10-CM | POA: Diagnosis not present

## 2024-05-15 DIAGNOSIS — I429 Cardiomyopathy, unspecified: Secondary | ICD-10-CM | POA: Diagnosis not present

## 2024-05-15 DIAGNOSIS — I472 Ventricular tachycardia, unspecified: Secondary | ICD-10-CM | POA: Diagnosis not present

## 2024-05-15 DIAGNOSIS — I5022 Chronic systolic (congestive) heart failure: Secondary | ICD-10-CM | POA: Diagnosis not present

## 2024-05-15 DIAGNOSIS — E11319 Type 2 diabetes mellitus with unspecified diabetic retinopathy without macular edema: Secondary | ICD-10-CM | POA: Diagnosis not present

## 2024-05-15 DIAGNOSIS — E7800 Pure hypercholesterolemia, unspecified: Secondary | ICD-10-CM | POA: Diagnosis not present

## 2024-05-15 DIAGNOSIS — Z792 Long term (current) use of antibiotics: Secondary | ICD-10-CM | POA: Diagnosis not present

## 2024-05-15 DIAGNOSIS — I130 Hypertensive heart and chronic kidney disease with heart failure and stage 1 through stage 4 chronic kidney disease, or unspecified chronic kidney disease: Secondary | ICD-10-CM | POA: Diagnosis not present

## 2024-05-15 DIAGNOSIS — E1151 Type 2 diabetes mellitus with diabetic peripheral angiopathy without gangrene: Secondary | ICD-10-CM | POA: Diagnosis not present

## 2024-05-15 DIAGNOSIS — Z95810 Presence of automatic (implantable) cardiac defibrillator: Secondary | ICD-10-CM | POA: Diagnosis not present

## 2024-05-15 DIAGNOSIS — C221 Intrahepatic bile duct carcinoma: Secondary | ICD-10-CM | POA: Diagnosis not present

## 2024-05-15 NOTE — Telephone Encounter (Signed)
 Copied from CRM [id number]. Topic: General - Other >> May 15, 2024 10:31 AM Hillary B wrote: Reason for CRM: Savannah from [employer] wanted to inform the patients primary care physician, Dr. Randol, that they received a palliative care order for Eduardo [name], so their palliative care team is now following the patient. She only wanted to pass this along to inform the office, so there is no additional information at this time! Thanks team.

## 2024-05-15 NOTE — Progress Notes (Signed)
 Faxed referral order and progress note to [facility] for an appointment.

## 2024-05-15 NOTE — Progress Notes (Signed)
 ADVANCED HF CLINIC NOTE  Referring Physician: [name], MD Primary Care: [name], MD Primary Cardiologist: Newman JINNY Lawrence, MD Heart Failure Cardiologist: Toribio Fuel, MD  Chief Complaint: Biventricular heart failure  HPI: Eduardo [name] is 68 y.o. male with severe 3V CAD (medically managed), R SFA occlusion s/p PTA with 3 overlapping DES, DM2, R BKA, CKD IIIa, HTN, HLD and systolic HF due to iCM.   Presented to [facility] 12/21 with acute hypoxic respiratory failure due to pulmonary edema. Intubated in ER. He was found to have new LBBB with pulmonary edema and code STEMI called. He was taken to cath lab found to EF 15-20%, LVEDP 29 and severe native three-vessel coronary artery  disease (LAD 100%p LCX all marginals occluded< RCA diffuse distal 99%) - with no obvious culprit lesion and no targets for revascularization. Hstrop peaked at 27k. Admitted to CCU for management of cardiogenic shock. Started on IOWA.  Echo EF 20-25% RV normal. He stabilized relatively quickly and was discharged home.    Seen in VAD Clinic 1/22 and decided against VAD.   Readmitted 12/28/20 with ADHF. EF 15%. Went for cath with Dr. Lawrence and Dr. Ladona. Cath with 100% LAD. OM-1 100% Distal RCA 99% Attempted CTO of LAD with Impella support but unsuccessful.  Of note, he underwent small bowel resection and anastomosis for small bowel obstruction where liver mass was noted. He underwent biopsy which showed poorly differentiated carcinoma, most consistent with cholangiocarcinoma.     Admitted 5/25 with acute on chronic biventricular HF. Echo showed EF 20-25% and new severely reduced RV, mild MR. RHC showed severely elevated left sided filling pressures with PCWP of 29 and reduced TD CI of 1.8. Required pressor and inotrope support to diurese. Required midodrine  to fully wean off inotrope. No GDMT with soft BP. With recent diagnosis of cholangiocarcinoma, he was deemed not a candidate for VAD and he previously  declined.   Here today for close HF follow-up. Weight is down 12 lb from last visit. Feels he responded well to higher dose of Torsemide  but doesn't really feel like metolazone  did much. He's noticed improvement in lower extremity edema. No dyspnea, abdominal bloating, orthopnea or PND. Still struggling with fatigue, some days are better than others. Some days can walk around [employer], others he doesn't have enough energy to leave home. Stopped taking Mounjaro, feels it suppressed his appetite too much.   Past Medical History:  Diagnosis Date   CHF (congestive heart failure) (HCC)    Chronic kidney disease    stage 3   Colon polyp    Coronary artery disease    Diabetes mellitus 1987   under care of Dr. Tommas.  On insulin  since 96 (off and on)   Diabetic retinopathy    Dupuytren contracture    R hand, s/p injection (Dr. Lorretta)   Essential hypertension, benign    Essential hypertension, benign 02/06/2019   Frequency of urination and polyuria    Hypertension    Myocardial infarction [facility])    denies   Neuromuscular disorder (HCC)    Diabetic neuropathy   Osteomyelitis (HCC)    right foot   Other testicular hypofunction    Peripheral arterial disease (HCC) 10/28/2012   Peritoneal abscess (HCC) 6/08   and buttock.   Pneumonia    Polydipsia    Proteinuria    Pure hyperglyceridemia    Subacute osteomyelitis, right ankle and foot (HCC)    Wears glasses     Current Outpatient Medications  Medication Sig Dispense Refill   Bioflavonoid Products (ESTER C PO) Take 1,000 mg by mouth daily.     cefTRIAXone  (ROCEPHIN ) IVPB Inject 2 g into the vein daily for 25 days. Indication:  GBS bacteremia First Dose: Yes Last Day of Therapy:  06/02/24 Labs - Once weekly:  CBC/D and BMP, Labs - Once weekly: ESR and CRP Method of administration: IV Push Method of administration may be changed at the discretion of home infusion pharmacist based upon assessment of the patient and/or caregiver's ability to  self-administer the medication ordered. 25 Units 0   Cholecalciferol  (VITAMIN D ) 2000 units CAPS Take 2,000 Units by mouth daily.     clopidogrel  (PLAVIX ) 75 MG tablet TAKE 1 TABLET BY MOUTH DAILY 100 tablet 1   Continuous Glucose Sensor (FREESTYLE LIBRE 2 PLUS SENSOR) MISC 1 Application.     ezetimibe  (ZETIA ) 10 MG tablet TAKE 1 TABLET BY MOUTH DAILY 100 tablet 1   famotidine  (PEPCID ) 10 MG tablet Take 10 mg by mouth 2 (two) times daily as needed for heartburn or indigestion.     gemfibrozil  (LOPID ) 600 MG tablet Take 600 mg by mouth 2 (two) times daily before a meal.     HUMALOG KWIKPEN 100 UNIT/ML KiwkPen Inject 3-10 Units into the skin 3 (three) times daily. Inject 01-1009 units into the skin three times a day, per sliding scale- based on BGL >100  1   HUMULIN  N KWIKPEN 100 UNIT/ML KwikPen Inject 6 Units into the skin daily. If BS is under 150 do not take per provider 45 until 15 mg am lunch 15 mg and night 15mg      Krill Oil 300 MG CAPS Take 500 mg by mouth daily.     lidocaine  (XYLOCAINE ) 5 % ointment Apply 1 Application topically as needed. For pain as needed- can use up to 4x/day 50 g 5   melatonin 10 MG TABS Take 10 mg by mouth at bedtime.  0   midodrine  (PROAMATINE ) 10 MG tablet Take 1.5 tablets (15 mg total) by mouth 3 (three) times daily with meals. 135 tablet 0   Multiple Vitamin (MULTIVITAMIN WITH MINERALS) TABS tablet Take 1 tablet by mouth daily.     nitroGLYCERIN  (NITROSTAT ) 0.4 MG SL tablet Place 1 tablet (0.4 mg total) under the tongue every 5 (five) minutes as needed for chest pain. 15 tablet 3   potassium chloride  SA (KLOR-CON  M) 20 MEQ tablet Take 2 tablets (40 mEq total) by mouth daily. (Patient not taking: Reported on 05/11/2024)     Probiotic Product (PROBIOTIC-10 PO) Take 1 capsule by mouth daily.     prochlorperazine  (COMPAZINE ) 5 MG tablet Take 1 tablet (5 mg total) by mouth every 8 (eight) hours as needed for nausea or vomiting. 30 tablet 0   rosuvastatin  (CRESTOR ) 20 MG  tablet Take 1 tablet (20 mg total) by mouth daily. 90 tablet 3   sodium chloride  (OCEAN) 0.65 % SOLN nasal spray Place 1 spray into both nostrils as needed for congestion.     tamsulosin  (FLOMAX ) 0.4 MG CAPS capsule Take 1 capsule (0.4 mg total) by mouth daily after supper. 30 capsule 6   torsemide  (DEMADEX ) 10 MG tablet Take 3 tablets (30 mg total) by mouth 2 (two) times daily. 180 tablet 0   No current facility-administered medications for this visit.    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 Retired. 1 cat.  Girlfriend and her daughter lives with him. Enjoys fishing.   Updated 06/2021   Social Drivers of Health   Financial Resource Strain: Low Risk  (07/28/2023)   Overall Financial Resource Strain (CARDIA)    Difficulty of  Paying Living Expenses: Not hard at all  Food Insecurity: No Food Insecurity (05/05/2024)   Hunger Vital Sign    Worried About Running Out of Food in the Last Year: Never true    Ran Out of Food in the Last Year: Never true  Transportation Needs: No Transportation Needs (05/05/2024)   PRAPARE - Administrator, Civil Service (Medical): No    Lack of Transportation (Non-Medical): No  Physical Activity: Sufficiently Active (07/28/2023)   Exercise Vital Sign    Days of Exercise per Week: 7 days    Minutes of Exercise per Session: 90 min  Stress: No Stress Concern Present (07/28/2023)   [employer] of Occupational Health - Occupational Stress Questionnaire    Feeling of Stress : Not at all  Social Connections: Socially Isolated (05/05/2024)   Social Connection and Isolation Panel    Frequency of Communication with Friends and Family: Never    Frequency of Social Gatherings with Friends and Family: Never    Attends Religious Services: Never    Database administrator or Organizations: No    Attends Banker Meetings: Never    Marital Status: Living with partner  Intimate Partner Violence: Not At Risk (05/05/2024)   Humiliation, Afraid, Rape, and Kick questionnaire    Fear of Current or Ex-Partner: No    Emotionally Abused: No    Physically Abused: No    Sexually Abused: No      Family History  Problem Relation Age of Onset   Diabetes Mother    Hearing loss Mother    Hypertension Mother    Hyperlipidemia Mother    Heart disease Mother    Varicose Veins Mother    Varicose Veins Father    Dementia Father    Hyperlipidemia Brother    Diabetes Maternal Grandmother     There were no vitals filed for this visit.    PHYSICAL  EXAM: General:  Chronically ill appearing HEENT: + temporal wasting Neck: JVP to midneck Cor: Regular rate & rhythm. No rubs, gallops or murmurs. Lungs: clear Abdomen: soft, nontender, nondistended.  Extremities: 2+ LLE edema, RLE  prosthetic Neuro: alert & orientedx3. Affect pleasant   Wt Readings from Last 3 Encounters:  05/11/24 99.9 kg (220 lb 4.8 oz)  05/08/24 94.6 kg (208 lb 8.9 oz)  04/24/24 91.2 kg (201 lb)     St Jude ICD device interrogation: No AT/AF or VT. 94% BiV paced. CorVue thoracic impedance above threshold.  ReDs reading: 44%, abnormal. See below.   ASSESSMENT & PLAN: 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 Longstanding history of severely reduced EF <20-25% since 2021 with diastolic dysfunction and relatively ok RV. - Has CRT-D - Echo 5/25 EF 20-25% and new severely reduced RV, mild MR - RHC 5/21 on 8 mcg/min of NE showed severely elevated left sided filling pressures with PCWP of 29 and reduced TD CI of 1.8. - NYHA III. Predominately limited by fatigue - Volume overloaded by exam and ReDS which is elevated at 44%. Interestingly, CorVue not suggestive of volume overload.  Increase Torsemide  from 40 mg BID to 60 mg BID and add 20 mEq KCL daily. Considered additional metolazone  but he does not feel he responded much to the 2.5 mg dose, concerned higher dose may wipe out his energy. - BMET/BNP today, order placed for him to get BMET same day as f/u with Dr. Elmira next week - Continue midodrine  to 15 mg tid. No BP room to wean. - GDMT limited by hypotension and renal function - Previously worked up for VAD in 2022, did not want to pursue. At this point would not be a candidate for advanced therapies, goal is to allow him some quality time out of hospital.  He is now DNR.    2. CAD - severe native three-vessel coronary artery disease (LAD 100%, p LCX all marginals occluded, RCA diffuse distal 99%)  - No chest pain.  - continue plavix . On crestor , zetia  and gemfibrozil . Last LDL 29 and Tg 82. PCP following labs. Consider stopping gemfibrozil  if triglycerides remain at goal.   3. NSVT  - Continue amiodarone  200 mg daily. Check TSH, Free T4 and LFTs next visit - No recent episodes on  device - he has ICD    4. CKD 3b - b/l Cr 1.3-1.5, recently closer to 1.7 - Labs today  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 Seen by [name] 05/25. Not a surgical candidate currently. Can consider other treatment options including hepatic embolization and systemic therapy pending molecular testing of liver tissue biopsy and outpatient PET scan. Has f/u with Oncology this week.    Follow up: His Cardiologist Dr. Elmira as scheduled next week, 4 weeks with APP in HF clinic  [name] CHRISTELLA Gainer PA-C Advanced [facility] Failure [facility] [address] [facility] [place] [address] [phone] (office) [phone] (fax)

## 2024-05-15 NOTE — Telephone Encounter (Signed)
 Called to confirm/remind patient of their appointment at the Advanced Heart Failure Clinic on 05/15/24.       Appointment:              [x] Confirmed             [] Left mess              [] No answer/No voice mail             [] Phone not in service   Patient reminded to bring all medications and/or complete list.   Confirmed patient has transportation. Gave directions, instructed to utilize valet parking.

## 2024-05-16 ENCOUNTER — Encounter (HOSPITAL_COMMUNITY): Payer: Self-pay

## 2024-05-16 ENCOUNTER — Ambulatory Visit (HOSPITAL_COMMUNITY): Payer: Self-pay | Admitting: Family Medicine

## 2024-05-16 ENCOUNTER — Telehealth: Payer: Self-pay

## 2024-05-16 ENCOUNTER — Telehealth (HOSPITAL_COMMUNITY): Payer: Self-pay | Admitting: *Deleted

## 2024-05-16 ENCOUNTER — Ambulatory Visit (HOSPITAL_COMMUNITY)
Admission: RE | Admit: 2024-05-16 | Discharge: 2024-05-16 | Disposition: A | Discharge status: Home / Self Care | Source: Ambulatory Visit | Attending: Family Medicine | Admitting: Family Medicine

## 2024-05-16 VITALS — BP 102/60 | HR 79 | Wt 218.8 lb

## 2024-05-16 DIAGNOSIS — I5082 Biventricular heart failure: Secondary | ICD-10-CM | POA: Insufficient documentation

## 2024-05-16 DIAGNOSIS — Z7902 Long term (current) use of antithrombotics/antiplatelets: Secondary | ICD-10-CM | POA: Insufficient documentation

## 2024-05-16 DIAGNOSIS — N1832 Chronic kidney disease, stage 3b: Secondary | ICD-10-CM | POA: Diagnosis not present

## 2024-05-16 DIAGNOSIS — Z66 Do not resuscitate: Secondary | ICD-10-CM | POA: Insufficient documentation

## 2024-05-16 DIAGNOSIS — I130 Hypertensive heart and chronic kidney disease with heart failure and stage 1 through stage 4 chronic kidney disease, or unspecified chronic kidney disease: Secondary | ICD-10-CM | POA: Diagnosis not present

## 2024-05-16 DIAGNOSIS — I25118 Atherosclerotic heart disease of native coronary artery with other forms of angina pectoris: Secondary | ICD-10-CM

## 2024-05-16 DIAGNOSIS — I4729 Other ventricular tachycardia: Secondary | ICD-10-CM | POA: Diagnosis not present

## 2024-05-16 DIAGNOSIS — I447 Left bundle-branch block, unspecified: Secondary | ICD-10-CM | POA: Insufficient documentation

## 2024-05-16 DIAGNOSIS — I472 Ventricular tachycardia, unspecified: Secondary | ICD-10-CM | POA: Diagnosis not present

## 2024-05-16 DIAGNOSIS — I5022 Chronic systolic (congestive) heart failure: Secondary | ICD-10-CM | POA: Diagnosis not present

## 2024-05-16 DIAGNOSIS — Z79899 Other long term (current) drug therapy: Secondary | ICD-10-CM | POA: Diagnosis not present

## 2024-05-16 DIAGNOSIS — I2510 Atherosclerotic heart disease of native coronary artery without angina pectoris: Secondary | ICD-10-CM | POA: Diagnosis not present

## 2024-05-16 DIAGNOSIS — E785 Hyperlipidemia, unspecified: Secondary | ICD-10-CM | POA: Insufficient documentation

## 2024-05-16 DIAGNOSIS — K56699 Other intestinal obstruction unspecified as to partial versus complete obstruction: Secondary | ICD-10-CM | POA: Diagnosis not present

## 2024-05-16 DIAGNOSIS — E1122 Type 2 diabetes mellitus with diabetic chronic kidney disease: Secondary | ICD-10-CM | POA: Insufficient documentation

## 2024-05-16 DIAGNOSIS — I38 Endocarditis, valve unspecified: Secondary | ICD-10-CM | POA: Diagnosis not present

## 2024-05-16 DIAGNOSIS — C221 Intrahepatic bile duct carcinoma: Secondary | ICD-10-CM | POA: Insufficient documentation

## 2024-05-16 LAB — BASIC METABOLIC PANEL WITH GFR
Anion gap: 14 (ref 5–15)
BUN: 43 mg/dL — ABNORMAL HIGH (ref 8–23)
CO2: 27 mmol/L (ref 22–32)
Calcium: 8.9 mg/dL (ref 8.9–10.3)
Chloride: 97 mmol/L — ABNORMAL LOW (ref 98–111)
Creatinine, Ser: 1.59 mg/dL — ABNORMAL HIGH (ref 0.61–1.24)
GFR, Estimated: 47 mL/min — ABNORMAL LOW (ref >60)
Glucose, Bld: 161 mg/dL — ABNORMAL HIGH (ref 70–99)
Potassium: 3.9 mmol/L (ref 3.5–5.1)
Sodium: 138 mmol/L (ref 135–145)

## 2024-05-16 LAB — BRAIN NATRIURETIC PEPTIDE: B Natriuretic Peptide: 3342.8 pg/mL — ABNORMAL HIGH (ref 0.0–100.0)

## 2024-05-16 LAB — CBC
HCT: 41.1 % (ref 39.0–52.0)
Hemoglobin: 13.3 g/dL (ref 13.0–17.0)
MCH: 28.6 pg (ref 26.0–34.0)
MCHC: 32.4 g/dL (ref 30.0–36.0)
MCV: 88.4 fL (ref 80.0–100.0)
Platelets: 179 K/uL (ref 150–400)
RBC: 4.65 MIL/uL (ref 4.22–5.81)
RDW: 18.1 % — ABNORMAL HIGH (ref 11.5–15.5)
WBC: 8 K/uL (ref 4.0–10.5)
nRBC: 0 % (ref 0.0–0.2)

## 2024-05-16 LAB — T4, FREE: Free T4: 0.7 ng/dL (ref 0.61–1.12)

## 2024-05-16 LAB — TSH: TSH: 11.117 u[IU]/mL — ABNORMAL HIGH (ref 0.350–4.500)

## 2024-05-16 NOTE — Patient Instructions (Addendum)
 No medication changes today. Labs today - will call you if abnormal. Will contact [facility] to ask for [facility] boot for your left leg. Return to Heart Failure APP Clinic in 4 - 6 weeks. See below. Return to see Dr. Cherrie in 3 months. PLEASE CALL [phone] IN SEPTEMBER TO SCHEDULE THIS APPOINTMENT. Please call us  at [phone] if any questions or concern prior to your next visit.

## 2024-05-16 NOTE — Transitions of Care (Post Inpatient/ED Visit) (Signed)
   05/16/2024  Name: Eduardo [name] MRN: [id number] DOB: [birth year]  Today's TOC FU Call Status: Today's TOC FU Call Status:: Unsuccessful Call (3rd Attempt) Unsuccessful Call (3rd Attempt) Date: 05/16/24  Attempted to reach the patient regarding the most recent Inpatient/ED visit.  Follow Up Plan: No further outreach attempts will be made at this time. We have been unable to contact the patient.   Bing Edison MSN, RN RN Case Sales executive Health  [facility] Office Hours M-F [phone] Direct Dial: [phone] Main Phone [phone]  Fax: [phone] [email or web address]

## 2024-05-16 NOTE — Telephone Encounter (Signed)
 Called [facility] per [name] Gainer, NP asking patient get [facility] boot to be applied to LLE as soon as possible.   Orders faxed as requested.  [facility] Office Phone: [phone] option 2 Fax: [phone]

## 2024-05-17 ENCOUNTER — Other Ambulatory Visit (HOSPITAL_COMMUNITY): Payer: Self-pay

## 2024-05-17 ENCOUNTER — Telehealth (HOSPITAL_COMMUNITY): Payer: Self-pay | Admitting: Pharmacy Technician

## 2024-05-17 DIAGNOSIS — C221 Intrahepatic bile duct carcinoma: Secondary | ICD-10-CM | POA: Diagnosis not present

## 2024-05-17 DIAGNOSIS — R6520 Severe sepsis without septic shock: Secondary | ICD-10-CM | POA: Diagnosis not present

## 2024-05-17 DIAGNOSIS — A419 Sepsis, unspecified organism: Secondary | ICD-10-CM | POA: Diagnosis not present

## 2024-05-17 MED ORDER — FUROSCIX 80 MG/10ML ~~LOC~~ CTKT
80.0000 mg | CARTRIDGE | SUBCUTANEOUS | 0 refills | Status: DC | PRN
Start: 2024-05-17 — End: 2024-06-01
  Filled 2024-05-17: qty 4, 4d supply, fill #0
  Filled 2024-05-17: qty 4, fill #0

## 2024-05-17 NOTE — Telephone Encounter (Addendum)
 Medication Samples have been provided to the patient.  Drug name: Furoscix        Strength: 80mg /38ml        Qty: 3  LOT: [id number]  Exp.Date: 04/08/25  Dosing instructions: take as directed by the hear fail;ure clinic  The patient has been instructed regarding the correct time, dose, and frequency of taking this medication, including desired effects and most common side effects.   Sherl Yzaguirre M Zoeann Mol 10:30 AM 05/17/2024   Pt aware, agreeable, and verbalized understanding  Samples left a front desk for patient. Labs ordered and scheduled

## 2024-05-17 NOTE — Telephone Encounter (Signed)
 Patient Advocate Encounter   Received notification from [employer] that prior authorization for Furoscix  is required.   PA submitted on CoverMyMeds Key [id number] Status is pending   Will continue to follow.

## 2024-05-17 NOTE — Telephone Encounter (Signed)
 Advanced Heart Failure Patient Advocate Encounter  Prior Authorization for Furoscix  has been approved.    PA# [id number] Effective dates: 05/17/24 through 11/08/24  Patients co-pay is $275.49 for 1 kit, $1,101.33 for 4 kits.  Patient will receive samples from the office as there are no grants available at this time.  Almarie JULIANNA Pa, CPhT

## 2024-05-17 NOTE — Addendum Note (Signed)
 Addended by: Lelaina Oatis M on: 05/17/2024 10:31 AM   Modules accepted: Orders

## 2024-05-18 ENCOUNTER — Telehealth (HOSPITAL_COMMUNITY): Payer: Self-pay | Admitting: Cardiology

## 2024-05-18 ENCOUNTER — Other Ambulatory Visit (HOSPITAL_COMMUNITY): Payer: Self-pay

## 2024-05-18 DIAGNOSIS — I5022 Chronic systolic (congestive) heart failure: Secondary | ICD-10-CM | POA: Diagnosis not present

## 2024-05-18 DIAGNOSIS — E1122 Type 2 diabetes mellitus with diabetic chronic kidney disease: Secondary | ICD-10-CM | POA: Diagnosis not present

## 2024-05-18 DIAGNOSIS — I130 Hypertensive heart and chronic kidney disease with heart failure and stage 1 through stage 4 chronic kidney disease, or unspecified chronic kidney disease: Secondary | ICD-10-CM | POA: Diagnosis not present

## 2024-05-18 DIAGNOSIS — E1140 Type 2 diabetes mellitus with diabetic neuropathy, unspecified: Secondary | ICD-10-CM | POA: Diagnosis not present

## 2024-05-18 DIAGNOSIS — I429 Cardiomyopathy, unspecified: Secondary | ICD-10-CM | POA: Diagnosis not present

## 2024-05-18 DIAGNOSIS — I472 Ventricular tachycardia, unspecified: Secondary | ICD-10-CM | POA: Diagnosis not present

## 2024-05-18 DIAGNOSIS — E1151 Type 2 diabetes mellitus with diabetic peripheral angiopathy without gangrene: Secondary | ICD-10-CM | POA: Diagnosis not present

## 2024-05-18 DIAGNOSIS — I50812 Chronic right heart failure: Secondary | ICD-10-CM | POA: Diagnosis not present

## 2024-05-18 DIAGNOSIS — N1832 Chronic kidney disease, stage 3b: Secondary | ICD-10-CM | POA: Diagnosis not present

## 2024-05-18 DIAGNOSIS — A401 Sepsis due to streptococcus, group B: Secondary | ICD-10-CM | POA: Diagnosis not present

## 2024-05-18 DIAGNOSIS — C221 Intrahepatic bile duct carcinoma: Secondary | ICD-10-CM | POA: Diagnosis not present

## 2024-05-18 DIAGNOSIS — E11319 Type 2 diabetes mellitus with unspecified diabetic retinopathy without macular edema: Secondary | ICD-10-CM | POA: Diagnosis not present

## 2024-05-18 NOTE — Telephone Encounter (Signed)
 Patient called to report his 1st day of Furoscix , unit fell off after one hour. Was told from Furo[employer]ix  to use a new infuser so now patient is short one infuser. At the time office is out of samples    Will forward to provider for care plan changes

## 2024-05-18 NOTE — Telephone Encounter (Signed)
 Ok to give use Furoscix  for 2 days. Will see how he feels after completing the 2 does of Furoscix .   Please call.    Emmalea Treanor NP-C  12:36 PM

## 2024-05-18 NOTE — Telephone Encounter (Signed)
 Updated to plan left on VM per DPR

## 2024-05-21 DIAGNOSIS — Z792 Long term (current) use of antibiotics: Secondary | ICD-10-CM | POA: Diagnosis not present

## 2024-05-22 ENCOUNTER — Encounter: Payer: Self-pay | Admitting: Internal Medicine

## 2024-05-22 ENCOUNTER — Telehealth: Payer: Self-pay

## 2024-05-22 ENCOUNTER — Ambulatory Visit: Admitting: Internal Medicine

## 2024-05-22 ENCOUNTER — Other Ambulatory Visit: Payer: Self-pay

## 2024-05-22 VITALS — BP 101/71 | HR 85 | Temp 97.5°F | Ht 69.0 in | Wt 220.0 lb

## 2024-05-22 DIAGNOSIS — E7800 Pure hypercholesterolemia, unspecified: Secondary | ICD-10-CM | POA: Diagnosis not present

## 2024-05-22 DIAGNOSIS — B951 Streptococcus, group B, as the cause of diseases classified elsewhere: Secondary | ICD-10-CM

## 2024-05-22 DIAGNOSIS — Z6823 Body mass index (BMI) 23.0-23.9, adult: Secondary | ICD-10-CM | POA: Diagnosis not present

## 2024-05-22 DIAGNOSIS — Z792 Long term (current) use of antibiotics: Secondary | ICD-10-CM | POA: Diagnosis not present

## 2024-05-22 DIAGNOSIS — Z794 Long term (current) use of insulin: Secondary | ICD-10-CM | POA: Diagnosis not present

## 2024-05-22 DIAGNOSIS — R7881 Bacteremia: Secondary | ICD-10-CM | POA: Diagnosis not present

## 2024-05-22 DIAGNOSIS — I429 Cardiomyopathy, unspecified: Secondary | ICD-10-CM | POA: Diagnosis not present

## 2024-05-22 DIAGNOSIS — N1832 Chronic kidney disease, stage 3b: Secondary | ICD-10-CM | POA: Diagnosis not present

## 2024-05-22 DIAGNOSIS — E1151 Type 2 diabetes mellitus with diabetic peripheral angiopathy without gangrene: Secondary | ICD-10-CM | POA: Diagnosis not present

## 2024-05-22 DIAGNOSIS — A401 Sepsis due to streptococcus, group B: Secondary | ICD-10-CM | POA: Diagnosis not present

## 2024-05-22 DIAGNOSIS — I447 Left bundle-branch block, unspecified: Secondary | ICD-10-CM | POA: Diagnosis not present

## 2024-05-22 DIAGNOSIS — I50812 Chronic right heart failure: Secondary | ICD-10-CM | POA: Diagnosis not present

## 2024-05-22 DIAGNOSIS — Z452 Encounter for adjustment and management of vascular access device: Secondary | ICD-10-CM | POA: Diagnosis not present

## 2024-05-22 DIAGNOSIS — C221 Intrahepatic bile duct carcinoma: Secondary | ICD-10-CM | POA: Diagnosis not present

## 2024-05-22 DIAGNOSIS — I330 Acute and subacute infective endocarditis: Secondary | ICD-10-CM | POA: Diagnosis not present

## 2024-05-22 DIAGNOSIS — Z7902 Long term (current) use of antithrombotics/antiplatelets: Secondary | ICD-10-CM | POA: Diagnosis not present

## 2024-05-22 DIAGNOSIS — G8929 Other chronic pain: Secondary | ICD-10-CM | POA: Diagnosis not present

## 2024-05-22 DIAGNOSIS — I130 Hypertensive heart and chronic kidney disease with heart failure and stage 1 through stage 4 chronic kidney disease, or unspecified chronic kidney disease: Secondary | ICD-10-CM | POA: Diagnosis not present

## 2024-05-22 DIAGNOSIS — E1140 Type 2 diabetes mellitus with diabetic neuropathy, unspecified: Secondary | ICD-10-CM | POA: Diagnosis not present

## 2024-05-22 DIAGNOSIS — E11319 Type 2 diabetes mellitus with unspecified diabetic retinopathy without macular edema: Secondary | ICD-10-CM | POA: Diagnosis not present

## 2024-05-22 DIAGNOSIS — I5022 Chronic systolic (congestive) heart failure: Secondary | ICD-10-CM | POA: Diagnosis not present

## 2024-05-22 DIAGNOSIS — E1122 Type 2 diabetes mellitus with diabetic chronic kidney disease: Secondary | ICD-10-CM | POA: Diagnosis not present

## 2024-05-22 DIAGNOSIS — Z95810 Presence of automatic (implantable) cardiac defibrillator: Secondary | ICD-10-CM | POA: Diagnosis not present

## 2024-05-22 DIAGNOSIS — I472 Ventricular tachycardia, unspecified: Secondary | ICD-10-CM | POA: Diagnosis not present

## 2024-05-22 NOTE — Progress Notes (Unsigned)
 RFV: follow up for hospitalization -group b strep bacteremia in the setting of cholangiocarcinoma Patient ID: Eduardo [name], male   DOB: March 06, 1956, 68 y.o.   MRN: [id number]  HPI Eduardo [name] is a 68yo M with history of cholangiocarcinoma who was recently hospitalized for group b strep bacteremia, thought to be GI source. He was discharged on ceftriaxone  through 7/25. Labs this past week show sed rate of 2, crp 29, wbc 7 and cr 1.64 (at his baseline)   Eduardo [name] is not a candidate for surgery or hepatic embolization.  Treatment with systemic therapy is recommended.  He may be a candidate for treatment with Pemigatinib .  He was given reading materials on pemigatinib  today.    Outpatient Encounter Medications as of 05/22/2024  Medication Sig   amiodarone  (PACERONE ) 200 MG tablet Take 200 mg by mouth daily.   Bioflavonoid Products (ESTER C PO) Take 1,000 mg by mouth daily.   cefTRIAXone  (ROCEPHIN ) IVPB Inject 2 g into the vein daily for 25 days. Indication:  GBS bacteremia First Dose: Yes Last Day of Therapy:  06/02/24 Labs - Once weekly:  CBC/D and BMP, Labs - Once weekly: ESR and CRP Method of administration: IV Push Method of administration may be changed at the discretion of home infusion pharmacist based upon assessment of the patient and/or caregiver's ability to self-administer the medication ordered.   Cholecalciferol  (VITAMIN D ) 2000 units CAPS Take 2,000 Units by mouth daily.   clopidogrel  (PLAVIX ) 75 MG tablet TAKE 1 TABLET BY MOUTH DAILY   Continuous Glucose Sensor (FREESTYLE LIBRE 2 PLUS SENSOR) MISC 1 Application.   ezetimibe  (ZETIA ) 10 MG tablet TAKE 1 TABLET BY MOUTH DAILY   famotidine  (PEPCID ) 10 MG tablet Take 10 mg by mouth 2 (two) times daily as needed for heartburn or indigestion.   Furosemide  (FUROSCIX ) 80 MG/10ML CTKT Inject 80 mg into the skin as needed.   gemfibrozil  (LOPID ) 600 MG tablet Take 600 mg by mouth 2 (two) times daily before a meal.   HUMALOG KWIKPEN 100 UNIT/ML  KiwkPen Inject 3-10 Units into the skin 3 (three) times daily. Inject 01-1009 units into the skin three times a day, per sliding scale- based on BGL >100   HUMULIN  N KWIKPEN 100 UNIT/ML KwikPen Inject 6 Units into the skin daily. If BS is under 150 do not take per provider 45 until 15 mg am lunch 15 mg and night 15mg    Krill Oil 300 MG CAPS Take 500 mg by mouth daily.   lidocaine  (XYLOCAINE ) 5 % ointment Apply 1 Application topically as needed. For pain as needed- can use up to 4x/day   melatonin 10 MG TABS Take 10 mg by mouth at bedtime.   midodrine  (PROAMATINE ) 5 MG tablet Take 15 mg by mouth 3 (three) times daily with meals.   Multiple Vitamin (MULTIVITAMIN WITH MINERALS) TABS tablet Take 1 tablet by mouth daily.   nitroGLYCERIN  (NITROSTAT ) 0.4 MG SL tablet Place 1 tablet (0.4 mg total) under the tongue every 5 (five) minutes as needed for chest pain.   potassium chloride  SA (KLOR-CON  M) 20 MEQ tablet Take 2 tablets (40 mEq total) by mouth daily.   Probiotic Product (PROBIOTIC-10 PO) Take 1 capsule by mouth daily.   prochlorperazine  (COMPAZINE ) 5 MG tablet Take 1 tablet (5 mg total) by mouth every 8 (eight) hours as needed for nausea or vomiting.   rosuvastatin  (CRESTOR ) 20 MG tablet Take 1 tablet (20 mg total) by mouth daily.   sodium chloride  (OCEAN) 0.65 %  SOLN nasal spray Place 1 spray into both nostrils as needed for congestion.   tamsulosin  (FLOMAX ) 0.4 MG CAPS capsule Take 1 capsule (0.4 mg total) by mouth daily after supper.   torsemide  (DEMADEX ) 10 MG tablet Take 3 tablets (30 mg total) by mouth 2 (two) times daily.   No facility-administered encounter medications on file as of 05/22/2024.     Patient Active Problem List   Diagnosis Date Noted   Endocarditis 05/09/2024   End stage congestive heart failure (HCC) 05/08/2024   Cardiomyopathy (HCC) 05/08/2024   NSVT (nonsustained ventricular tachycardia) (HCC) 05/08/2024   Severe sepsis (HCC) 05/04/2024   Sepsis (HCC) 05/04/2024    Advanced care planning/counseling discussion 04/24/2024   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   HFrEF (heart failure with reduced ejection fraction) (HCC) 03/24/2024   Small bowel obstruction (HCC) 11/14/2023   Small bowel obstruction due to adhesions (HCC) 11/14/2023   Cardiac resynchronization therapy defibrillator (CRT-D) in place 02/04/2023   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   Chronic post-operative pain 09/18/2022   Abnormality of gait 09/18/2022   LBBB (left bundle branch block) 08/19/2022   Chronic systolic heart failure (HCC) 08/19/2022   Chronic right-sided congestive heart failure (HCC) 05/18/2022   Ejection fraction < 50% 04/17/2022   Insomnia    Constipation    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   Ischemic cardiomyopathy    AKI (acute kidney injury) (HCC) 03/13/2022   Bacteremia due to group B Streptococcus 03/13/2022   Hypophosphatemia 03/13/2022   Septic shock (HCC) 03/08/2022   Acute systolic heart failure (HCC) 12/28/2020   Acute pulmonary edema (HCC) 10/11/2020   Non-ST elevation (NSTEMI) myocardial infarction (HCC) 10/11/2020   Acute respiratory distress 10/11/2020   Acute on chronic combined systolic and diastolic CHF (congestive heart failure) (HCC) 10/11/2020   Cardiogenic shock (HCC)    Partial nontraumatic amputation of foot, right (HCC) 06/21/2019   Osteomyelitis of right foot (HCC)    Essential hypertension, benign 02/06/2019   Hypercholesteremia 02/06/2019   Stage 3b chronic kidney disease (CKD) (HCC) 10/06/2017   Proteinuria 10/06/2017   Controlled type 2 diabetes mellitus with hyperglycemia, with long-term current use of insulin  (HCC) 10/06/2017   Atherosclerosis of native coronary artery of native heart without angina pectoris 05/23/2017   Abnormal nuclear stress test 09/06/2016   PAD (peripheral artery disease) (HCC) 10/28/2012   Morbid obesity with BMI of 40.0-44.9, adult (HCC) 10/13/2012   Diabetes mellitus with  ophthalmic complication (HCC) 10/13/2012   Hypertension associated with diabetes (HCC) 10/13/2012   Atherosclerosis of native artery of extremity with intermittent claudication (HCC) 04/05/2012   Adenomatous colon polyp 09/28/2011   Diabetes mellitus (HCC) 09/28/2011   Pure hyperglyceridemia 09/28/2011   Erectile dysfunction 09/28/2011   Diabetic retinopathy (HCC) 09/28/2011     Health Maintenance Due  Topic Date Due   Diabetic kidney evaluation - Urine ACR  12/10/2023   COVID-19 Vaccine (7 - Pfizer risk 2024-25 season) 01/25/2024     Review of Systems Still having significant fatigue, dry heaving with nausea. Poor sleep. 12 point ros is otherwise negative Physical Exam   BP 101/71   Pulse 85   Temp (!) 97.5 F (36.4 C) (Temporal)   Ht 5' 9 (1.753 m)   Wt 220 lb (99.8 kg)   SpO2 97%   BMI 32.49 kg/m   Physical Exam  Constitutional: He is oriented to person, place, and time. He appears well-developed and well-nourished. No distress.  HENT:  Mouth/Throat: Oropharynx is clear  and moist. No oropharyngeal exudate.  Cardiovascular: Normal rate, regular rhythm and normal heart sounds. Exam reveals no gallop and no friction rub.  No murmur heard.  Pulmonary/Chest: Effort normal and breath sounds normal. No respiratory distress. He has no wheezes.  Abdominal: Soft. Bowel sounds are normal. He exhibits no distension. There is no tenderness.  Lymphadenopathy:  He has no cervical adenopathy.  Neurological: He is alert and oriented to person, place, and time.  Skin: Skin is warm and dry. No rash noted. No erythema.  Psychiatric: He has a normal mood and affect. His behavior is normal.   Lab Results  Component Value Date   LABRPR Non Reactive 06/12/2019    CBC Lab Results  Component Value Date   WBC 8.0 05/16/2024   RBC 4.65 05/16/2024   HGB 13.3 05/16/2024   HCT 41.1 05/16/2024   PLT 179 05/16/2024   MCV 88.4 05/16/2024   MCH 28.6 05/16/2024   MCHC 32.4 05/16/2024    RDW 18.1 (H) 05/16/2024   LYMPHSABS 0.3 (L) 05/04/2024   MONOABS 0.9 05/04/2024   EOSABS 0.0 05/04/2024    BMET Lab Results  Component Value Date   NA 138 05/16/2024   K 3.9 05/16/2024   CL 97 (L) 05/16/2024   CO2 27 05/16/2024   GLUCOSE 161 (H) 05/16/2024   BUN 43 (H) 05/16/2024   CREATININE 1.59 (H) 05/16/2024   CALCIUM  8.9 05/16/2024   GFRNONAA 47 (L) 05/16/2024   GFRAA 44 (L) 12/19/2020      Assessment and Plan Complicated group b streptococcal bacteremia = continue with ceftriaxone  2gm iv daily. Appears to tolerate okay. Plan to finish on 7/25. Can pull picc line thereafter unless needed through oncology.

## 2024-05-22 NOTE — Telephone Encounter (Signed)
 Per [name], okay to pull PICC after last dose on 06/02/24. Orders sent to Holley Herring, RN with [employer].  Diella Gillingham, BSN, RN

## 2024-05-23 ENCOUNTER — Other Ambulatory Visit: Payer: Self-pay

## 2024-05-23 ENCOUNTER — Encounter: Payer: Self-pay | Admitting: Nurse Practitioner

## 2024-05-23 ENCOUNTER — Inpatient Hospital Stay: Admitting: Nurse Practitioner

## 2024-05-23 ENCOUNTER — Inpatient Hospital Stay

## 2024-05-23 ENCOUNTER — Other Ambulatory Visit (HOSPITAL_BASED_OUTPATIENT_CLINIC_OR_DEPARTMENT_OTHER): Payer: Self-pay

## 2024-05-23 ENCOUNTER — Other Ambulatory Visit (HOSPITAL_COMMUNITY): Payer: Self-pay | Admitting: Cardiology

## 2024-05-23 VITALS — BP 96/70 | HR 89 | Temp 97.5°F | Resp 18 | Ht 69.0 in | Wt 222.6 lb

## 2024-05-23 DIAGNOSIS — C221 Intrahepatic bile duct carcinoma: Secondary | ICD-10-CM

## 2024-05-23 DIAGNOSIS — E1140 Type 2 diabetes mellitus with diabetic neuropathy, unspecified: Secondary | ICD-10-CM | POA: Diagnosis not present

## 2024-05-23 DIAGNOSIS — I2510 Atherosclerotic heart disease of native coronary artery without angina pectoris: Secondary | ICD-10-CM | POA: Diagnosis not present

## 2024-05-23 DIAGNOSIS — N189 Chronic kidney disease, unspecified: Secondary | ICD-10-CM | POA: Diagnosis not present

## 2024-05-23 DIAGNOSIS — I255 Ischemic cardiomyopathy: Secondary | ICD-10-CM | POA: Diagnosis not present

## 2024-05-23 DIAGNOSIS — I130 Hypertensive heart and chronic kidney disease with heart failure and stage 1 through stage 4 chronic kidney disease, or unspecified chronic kidney disease: Secondary | ICD-10-CM | POA: Diagnosis not present

## 2024-05-23 DIAGNOSIS — Z452 Encounter for adjustment and management of vascular access device: Secondary | ICD-10-CM | POA: Diagnosis not present

## 2024-05-23 DIAGNOSIS — E1122 Type 2 diabetes mellitus with diabetic chronic kidney disease: Secondary | ICD-10-CM | POA: Diagnosis not present

## 2024-05-23 DIAGNOSIS — R160 Hepatomegaly, not elsewhere classified: Secondary | ICD-10-CM

## 2024-05-23 DIAGNOSIS — B951 Streptococcus, group B, as the cause of diseases classified elsewhere: Secondary | ICD-10-CM | POA: Diagnosis not present

## 2024-05-23 DIAGNOSIS — R7881 Bacteremia: Secondary | ICD-10-CM | POA: Diagnosis not present

## 2024-05-23 LAB — CBC WITH DIFFERENTIAL (CANCER CENTER ONLY)
Abs Immature Granulocytes: 0.02 K/uL (ref 0.00–0.07)
Basophils Absolute: 0 K/uL (ref 0.0–0.1)
Basophils Relative: 1 %
Eosinophils Absolute: 0 K/uL (ref 0.0–0.5)
Eosinophils Relative: 0 %
HCT: 42.7 % (ref 39.0–52.0)
Hemoglobin: 13.4 g/dL (ref 13.0–17.0)
Immature Granulocytes: 0 %
Lymphocytes Relative: 13 %
Lymphs Abs: 0.8 K/uL (ref 0.7–4.0)
MCH: 28.6 pg (ref 26.0–34.0)
MCHC: 31.4 g/dL (ref 30.0–36.0)
MCV: 91 fL (ref 80.0–100.0)
Monocytes Absolute: 0.5 K/uL (ref 0.1–1.0)
Monocytes Relative: 7 %
Neutro Abs: 5.1 K/uL (ref 1.7–7.7)
Neutrophils Relative %: 79 %
Platelet Count: 153 K/uL (ref 150–400)
RBC: 4.69 MIL/uL (ref 4.22–5.81)
RDW: 18.1 % — ABNORMAL HIGH (ref 11.5–15.5)
WBC Count: 6.4 K/uL (ref 4.0–10.5)
nRBC: 0 % (ref 0.0–0.2)

## 2024-05-23 LAB — CMP (CANCER CENTER ONLY)
ALT: 16 U/L (ref 0–44)
AST: 36 U/L (ref 15–41)
Albumin: 3.6 g/dL (ref 3.5–5.0)
Alkaline Phosphatase: 149 U/L — ABNORMAL HIGH (ref 38–126)
Anion gap: 11 (ref 5–15)
BUN: 38 mg/dL — ABNORMAL HIGH (ref 8–23)
CO2: 26 mmol/L (ref 22–32)
Calcium: 9.3 mg/dL (ref 8.9–10.3)
Chloride: 102 mmol/L (ref 98–111)
Creatinine: 1.43 mg/dL — ABNORMAL HIGH (ref 0.61–1.24)
GFR, Estimated: 53 mL/min — ABNORMAL LOW (ref >60)
Glucose, Bld: 197 mg/dL — ABNORMAL HIGH (ref 70–99)
Potassium: 4 mmol/L (ref 3.5–5.1)
Sodium: 140 mmol/L (ref 135–145)
Total Bilirubin: 0.5 mg/dL (ref 0.0–1.2)
Total Protein: 6.1 g/dL — ABNORMAL LOW (ref 6.5–8.1)

## 2024-05-23 LAB — PHOSPHORUS: Phosphorus: 3.5 mg/dL (ref 2.5–4.6)

## 2024-05-23 MED ORDER — PEMIGATINIB 9 MG PO TABS
9.0000 mg | ORAL_TABLET | Freq: Every day | ORAL | 0 refills | Status: DC
  Filled 2024-05-25: qty 14, 21d supply, fill #0

## 2024-05-23 MED ORDER — HEPARIN SOD (PORK) LOCK FLUSH 100 UNIT/ML IV SOLN
250.0000 [IU] | INTRAVENOUS | Status: AC | PRN
  Administered 2024-05-23: 250 [IU]

## 2024-05-23 MED ORDER — SODIUM CHLORIDE 0.9% FLUSH
10.0000 mL | INTRAVENOUS | Status: AC | PRN
  Administered 2024-05-23: 10 mL

## 2024-05-23 MED ORDER — MIDODRINE HCL 5 MG PO TABS: 15.0000 mg | ORAL_TABLET | Freq: Three times a day (TID) | ORAL | 11 refills | Status: DC

## 2024-05-23 NOTE — Progress Notes (Signed)
 Eye center reports not able to locate the referral sent on 7/07. Confirmed correct fax number was used. Re-faxed referral and records again.

## 2024-05-23 NOTE — Progress Notes (Signed)
 [facility] OFFICE PROGRESS NOTE   Diagnosis: Cholangiocarcinoma  INTERVAL HISTORY:   [name] returns as scheduled.  Energy level remains poor.  Appetite varies.  He has intermittent dry heaves.  Maybe partial improvement with Compazine .  He denies significant shortness of breath.  No abdominal pain.  Objective:  Vital signs in last 24 hours:  Blood pressure 96/70, pulse 89, temperature (!) 97.5 F (36.4 C), temperature source Oral, resp. rate 18, height 5' 9 (1.753 m), weight 222 lb 9.6 oz (101 kg), SpO2 99%.    Resp: Lungs are clear, breath sounds diminished left lung base.  No respiratory distress. Cardio: Regular rate and rhythm. GI: Abdomen nontender. Vascular: Pitting edema left lower leg.  Leg is wrapped.   Lab Results:  Lab Results  Component Value Date   WBC 6.4 05/23/2024   HGB 13.4 05/23/2024   HCT 42.7 05/23/2024   MCV 91.0 05/23/2024   PLT 153 05/23/2024   NEUTROABS 5.1 05/23/2024    Imaging:  No results found.  Medications: I have reviewed the patient's current medications.  Assessment/Plan: Cholangiocarcinoma CT abdomen/pelvis 11/14/2023-closed-loop small bowel obstruction, 8.8 cm hypoattenuating lesion in the posterior right liver with suggestion of nodular peripheral enhancement, small bilateral pleural effusions MRI abdomen 01/27/2024-rim-enhancing centrally hypoattenuating mass in the posterior right liver segment 7 measuring 11 x 8.4 cm, increased in size compared to the 11/14/2023 CT consistent with a necrotic metastasis or primary liver mass, severe anasarca, small volume ascites, bilateral pleural effusions, mild splenomegaly CT abdomen/pelvis 02/14/2024-no change in complex right liver lesion, small bilateral pleural effusions left greater than right, soft tissue edema of the anterior abdominal wall Biopsy liver lesion 03/06/2024-benign hepatic parenchyma with reactive changes CT chest 03/09/2024-a few scattered small lung nodules  measuring up to 4 mm right lower and left upper lobes.  Mild cardiomegaly.  Stable right and moderate left pleural effusions.  Anasarca.  Similar large hypodense mass filling the posterior right hepatic lobe. 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 Hisaw appears unchanged.  He is scheduled to complete the course of IV ceftriaxone  06/02/2024.  We had previously discussed beginning treatment with Pemigatinib .  He would like to proceed.  We again reviewed potential side effects including hyperphosphatemia, ocular toxicity, diarrhea, hematologic toxicity.  He was previously provided with printed information on pemigatinib .  We reviewed the need for a baseline ophthalmology exam and routine ophthalmology follow-up every 2 months for the first 6 months and then every 3 months.  We will follow-up on the referral placed to [name] on 05/11/2024.  If okay from infectious disease standpoint our plan is for him to  begin Pemigatinib  05/29/2024.  We will confirm with [name].  Etiology of nausea is unclear.  Various possibilities.  He has Compazine  to try as needed.  He will return for lab and follow-up in approximately 2 weeks.  We are available to see him sooner if needed.  Patient seen with [name].    [name] ANP/GNP-BC   05/23/2024  2:37 PM  This was a shared visit with [name].  Mr. [place] is completing the course of antibiotics for the strep bacteremia.  He would like to proceed with treatment of the cholangiocarcinoma.  The plan is to begin pemigatinib .  We reviewed potential toxicities associated with pemigatinib  again today.  We discussed skin/nail, electrolyte, hematologic, ocular, and other toxicities associated with pemigatinib .  He will be contacted by the Cancer center pharmacist for further teaching on pemigatinib .  The pemigatinib  will be dose reduced secondary to renal failure and multiple comorbid conditions.  I was present for greater than 50% of today's visit.  I performed medical decision making.   [name], MD

## 2024-05-24 ENCOUNTER — Telehealth: Payer: Self-pay | Admitting: Pharmacy Technician

## 2024-05-24 ENCOUNTER — Other Ambulatory Visit (HOSPITAL_COMMUNITY): Payer: Self-pay

## 2024-05-24 ENCOUNTER — Telehealth: Payer: Self-pay | Admitting: Nurse Practitioner

## 2024-05-24 ENCOUNTER — Telehealth: Payer: Self-pay

## 2024-05-24 DIAGNOSIS — C221 Intrahepatic bile duct carcinoma: Secondary | ICD-10-CM

## 2024-05-24 NOTE — Telephone Encounter (Signed)
 Oral Oncology Patient Advocate Encounter  Prior Authorization for Pemazyre  has been approved.    PA# [id number] Effective dates: 05/24/2024 through 11/08/2024  Patients co-pay is $1,103.03.    Eduardo [name] (Patty) Chet Burnet, CPhT  [facility], [place], [place], [place] Oral Chemotherapy Patient Advocate Phone: [phone]  Fax: [phone]

## 2024-05-24 NOTE — Telephone Encounter (Signed)
 Clinical Pharmacist Encounter   Received new prescription for Pemazyre  (pemigatinib ) for the treatment of cholangiocarcinoma, planned duration until disease progression or unacceptable toxicity. Planned start date 05/29/2024  Labs from [facility] and CBC from 05/23/2024 assessed, no relevant lab abnormalities, no baseline medication adjustments needed at this time. Prescription dose and frequency assessed. Per MD notes 05/23/2024, the pemigatinib  will be dose reduced secondary to renal failure and multiple comorbid conditions.   Current medication list in Epic reviewed, 0 DDIs were identified in association with Pemazyre  (pemigatinib ).  Supportive Care/Monitoring Minimal to low emetic risk: patient already has prescription for prochlorperazine  5 mg every 8 hours as needed for nausea and vomiting of unknown origin. Referral placed for [name] for baseline ophthalmology exam with plan for every 2 months for the first 6 months and then every 3 months.  Evaluated chart and no patient barriers to medication adherence identified.   Prescription has been e-scribed to the [facility] for benefits analysis and approval.  Oral Oncology Clinic will continue to follow for insurance authorization, copayment issues, initial counseling and start date.  Patient agreed to treatment on 05/23/2024 per MD documentation.  Eduardo [name], PharmD Pharmacy Resident  05/24/2024 11:02 AM

## 2024-05-24 NOTE — Telephone Encounter (Signed)
 Contacted pt to schedule an appt per 05/23/24 LOS. Unable to reach via phone, voicemail was left.     Check out comments:Lab and GBS 7/31 or 8/1

## 2024-05-24 NOTE — Telephone Encounter (Signed)
 Oral Oncology Patient Advocate Encounter   Was successful in securing patient a $8,000 grant from [employer] to provide copayment coverage for PEMAZYRE .  This will keep the out of pocket expense at $0.    The billing information is as follows and has been shared with [facility].   Member ID: [id number] Group ID: [id number] RxBin: [id number] PCN: [id number] Dates of Eligibility: 05/24/2024 through 05/24/2025  Fund name:  Cholangiocarcinoma.   Debroah Shuttleworth (Patty) Chet Burnet, CPhT  [facility], [place], [place], [place] Oral Chemotherapy Patient Advocate Phone: [phone]  Fax: [phone]

## 2024-05-24 NOTE — Telephone Encounter (Signed)
 Oral Oncology Patient Advocate Encounter   Received notification that prior authorization for Pemazyre  is required.   PA submitted on 05/24/2024 Key [id number] Status is pending     Eduardo [name] (Patty) Chet Burnet, CPhT  [facility], [place], [place], [place] Oral Chemotherapy Patient Advocate Phone: [phone]  Fax: [phone]

## 2024-05-25 ENCOUNTER — Telehealth: Payer: Self-pay | Admitting: Pharmacy Technician

## 2024-05-25 ENCOUNTER — Other Ambulatory Visit: Payer: Self-pay | Admitting: Pharmacy Technician

## 2024-05-25 ENCOUNTER — Other Ambulatory Visit: Payer: Self-pay

## 2024-05-25 DIAGNOSIS — C221 Intrahepatic bile duct carcinoma: Secondary | ICD-10-CM | POA: Diagnosis not present

## 2024-05-25 DIAGNOSIS — R6520 Severe sepsis without septic shock: Secondary | ICD-10-CM | POA: Diagnosis not present

## 2024-05-25 DIAGNOSIS — A419 Sepsis, unspecified organism: Secondary | ICD-10-CM | POA: Diagnosis not present

## 2024-05-25 MED ORDER — PEMIGATINIB 9 MG PO TABS: 9.0000 mg | ORAL_TABLET | Freq: Every day | ORAL | 0 refills | Status: DC

## 2024-05-25 NOTE — Telephone Encounter (Signed)
 Oral Oncology Patient Advocate Encounter  I was informed by our pharmacy that medication is limited distribution.  Prescription has been redirected to [facility].  I have sent over supportive information to [facility].  I have spoken with the patient.   Patient knows to call me if he has any questions or concerns.  Shawntia Mangal (Patty) Chet Burnet, CPhT  [facility], [place], [place], [place] Oral Chemotherapy Patient Advocate Phone: [phone]  Fax: [phone]

## 2024-05-25 NOTE — Telephone Encounter (Signed)
 Patient successfully OnBoarded and drug education provided by pharmacist. Medication scheduled to be shipped on 07/18 for delivery on 07/21 from [facility] to patient's address. Patient also knows to call me at [phone] with any questions or concerns regarding receiving medication or if there is any unexpected change in co-pay.   Eduardo [name] (Patty) Chet Burnet, CPhT  [facility], [place], [place], [place] Oral Chemotherapy Patient Advocate Phone: [phone]  Fax: [phone]

## 2024-05-25 NOTE — Progress Notes (Signed)
 Patient education documented in EPIC note on 05/25/24.

## 2024-05-25 NOTE — Telephone Encounter (Signed)
 Clinical Pharmacist Encounter   Planned start date 05/29/2024. Per [name] ok to proceed with treatment before their eye exam.   Patient Education I spoke with patient for overview of new oral chemotherapy medication: Pemazyre  (pemigatinib ) for the treatment of cholangiocarcinoma, planned duration until disease progression or unacceptable toxicity.  Counseled patient on administration, dosing, side effects, monitoring, drug-food interactions, safe handling, storage, and disposal. Patient will take Pemazyre  (pemigatinib ) 9 mg for 14 days, the 7 days off, repeat every 21 days.   Side effects include but not limited to: Fatigue: Informed them to let their provider know if they start to experience fatigue that negatively impacts their day to day activities after starting this medication. Elevated Phosphorus levels: Informed the patient that this medication can cause an increase in phosphorus levels. Recommended they limit the phosphorus they consume in their diet until they know how this medication will effect them. Provided examples of phosphorus rich foods: processed foods like meats/cheeses, pizza, etc. Mailed them a handout with further examples of phosphorus rich foods.  Decrease white blood cell count: Informed the patient that this medication can decrease the number of cells important to help fight infection. If they develop a fever of 100.4 F, they should call their provider.  Diarrhea: Informed the patient this is a possible side effect. They can treat with over the counter Imodium. If this isn't effective or if they start to experience over 4 loose stools above normal per day, to call their provider. Nausea/vomiting: Informed the patient this can cause nausea and vomiting. They reported they still have a previous compazine  prescription at home since they dry heave not infrequently. Informed the patient to let their provider know if they need more medications/refills. Also if the patient  finds the Pemazyre  is making their symptoms worse, they can take the Compazine  before taking their Pemazyre  to help prevent the development of these symptoms. If the Compazine  is not effective, then they can call their provider for additional medications.  Constipation: Informed the patient they can take Miralax . If they find this isn't effective, they can call the office for additional medications to help manage.  Mouth sores/taste changes: Informed the patient this is a possible side effect. They can manage it at home with non-alcoholic mouthwash, but if this isn't effective they can call their provider for a prescription of magic mouth wash. If the altered taste impacts their ability to eat, then to let their provider know.  Eye problems: Patient understands they need to keep their eye appointments. They are in the process of establishing care. Right now their next eye appointment is scheduled for 06/14/2024.  Rash: Informed the patient that they can develop a rash while on this medication. They can use moisturize creams and avoid direct sunlight to reduce the risk of dry skin. If they develop a rash, they should call their provider for additional management.   Reviewed with patient importance of keeping a medication schedule and plan for any missed doses.  Distress thermometer completed during telephone call and reviewed with patient. Due to score, social work referral has not been sent. Patient reported 0 on scale of 1-10.   After discussion with patient no patient barriers to medication adherence identified.   Eduardo [name] voiced understanding and appreciation. All questions answered. Medication handout provided.  Provided patient with Oral Chemotherapy Navigation Clinic phone number. Patient knows to call the office with questions or concerns. Oral Chemotherapy Navigation Clinic will continue to follow.  Eduardo [name], PharmD Pharmacy Resident  05/25/2024 1:40 PM

## 2024-05-25 NOTE — Progress Notes (Signed)
 Oral Oncology Patient Advocate Encounter  Disenrolling patient.  I was alerted by pharmacy that this is a limited distribution drug.   Patient will now fill through [facility].  Eduardo [name] (Patty) Chet Burnet, CPhT  [facility], [place], [place], [place] Oral Chemotherapy Patient Advocate Phone: [phone]  Fax: [phone]

## 2024-05-25 NOTE — Progress Notes (Signed)
 Specialty Pharmacy Initial Fill Coordination Note  Eduardo [name] is a 68 y.o. male contacted today regarding refills of specialty medication(s) Pemigatinib  (PEMAZYRE ) .  Patient requested Delivery  on 05/29/24  to verified address [address] [place] [address]   Medication will be filled on 05/26/2024.   Patient is aware of $0 copayment. Leisure centre manager.  Pari Lombard (Patty) Chet Burnet, CPhT  [facility], [place], [place], [place] Oral Chemotherapy Patient Advocate Phone: [phone]  Fax: [phone]

## 2024-05-29 ENCOUNTER — Ambulatory Visit (HOSPITAL_COMMUNITY): Payer: Self-pay | Admitting: Family Medicine

## 2024-05-29 ENCOUNTER — Ambulatory Visit (HOSPITAL_COMMUNITY)
Admission: RE | Admit: 2024-05-29 | Discharge: 2024-05-29 | Disposition: A | Discharge status: Home / Self Care | Source: Ambulatory Visit | Attending: Cardiology

## 2024-05-29 DIAGNOSIS — E1151 Type 2 diabetes mellitus with diabetic peripheral angiopathy without gangrene: Secondary | ICD-10-CM | POA: Diagnosis not present

## 2024-05-29 DIAGNOSIS — I429 Cardiomyopathy, unspecified: Secondary | ICD-10-CM | POA: Diagnosis not present

## 2024-05-29 DIAGNOSIS — Z794 Long term (current) use of insulin: Secondary | ICD-10-CM | POA: Diagnosis not present

## 2024-05-29 DIAGNOSIS — I472 Ventricular tachycardia, unspecified: Secondary | ICD-10-CM | POA: Diagnosis not present

## 2024-05-29 DIAGNOSIS — N1832 Chronic kidney disease, stage 3b: Secondary | ICD-10-CM | POA: Diagnosis not present

## 2024-05-29 DIAGNOSIS — Z7902 Long term (current) use of antithrombotics/antiplatelets: Secondary | ICD-10-CM | POA: Diagnosis not present

## 2024-05-29 DIAGNOSIS — I330 Acute and subacute infective endocarditis: Secondary | ICD-10-CM | POA: Diagnosis not present

## 2024-05-29 DIAGNOSIS — I5022 Chronic systolic (congestive) heart failure: Secondary | ICD-10-CM | POA: Diagnosis not present

## 2024-05-29 DIAGNOSIS — E11319 Type 2 diabetes mellitus with unspecified diabetic retinopathy without macular edema: Secondary | ICD-10-CM | POA: Diagnosis not present

## 2024-05-29 DIAGNOSIS — I50812 Chronic right heart failure: Secondary | ICD-10-CM | POA: Diagnosis not present

## 2024-05-29 DIAGNOSIS — Z452 Encounter for adjustment and management of vascular access device: Secondary | ICD-10-CM | POA: Diagnosis not present

## 2024-05-29 DIAGNOSIS — E1122 Type 2 diabetes mellitus with diabetic chronic kidney disease: Secondary | ICD-10-CM | POA: Diagnosis not present

## 2024-05-29 DIAGNOSIS — Z95810 Presence of automatic (implantable) cardiac defibrillator: Secondary | ICD-10-CM | POA: Diagnosis not present

## 2024-05-29 DIAGNOSIS — E1140 Type 2 diabetes mellitus with diabetic neuropathy, unspecified: Secondary | ICD-10-CM | POA: Diagnosis not present

## 2024-05-29 DIAGNOSIS — I130 Hypertensive heart and chronic kidney disease with heart failure and stage 1 through stage 4 chronic kidney disease, or unspecified chronic kidney disease: Secondary | ICD-10-CM | POA: Diagnosis not present

## 2024-05-29 DIAGNOSIS — C221 Intrahepatic bile duct carcinoma: Secondary | ICD-10-CM | POA: Diagnosis not present

## 2024-05-29 DIAGNOSIS — G8929 Other chronic pain: Secondary | ICD-10-CM | POA: Diagnosis not present

## 2024-05-29 DIAGNOSIS — A401 Sepsis due to streptococcus, group B: Secondary | ICD-10-CM | POA: Diagnosis not present

## 2024-05-29 DIAGNOSIS — Z6823 Body mass index (BMI) 23.0-23.9, adult: Secondary | ICD-10-CM | POA: Diagnosis not present

## 2024-05-29 DIAGNOSIS — E7800 Pure hypercholesterolemia, unspecified: Secondary | ICD-10-CM | POA: Diagnosis not present

## 2024-05-29 DIAGNOSIS — I447 Left bundle-branch block, unspecified: Secondary | ICD-10-CM | POA: Diagnosis not present

## 2024-05-29 DIAGNOSIS — Z792 Long term (current) use of antibiotics: Secondary | ICD-10-CM | POA: Diagnosis not present

## 2024-05-29 LAB — BASIC METABOLIC PANEL WITH GFR
Anion gap: 17 — ABNORMAL HIGH (ref 5–15)
BUN: 32 mg/dL — ABNORMAL HIGH (ref 8–23)
CO2: 21 mmol/L — ABNORMAL LOW (ref 22–32)
Calcium: 9.1 mg/dL (ref 8.9–10.3)
Chloride: 102 mmol/L (ref 98–111)
Creatinine, Ser: 1.39 mg/dL — ABNORMAL HIGH (ref 0.61–1.24)
GFR, Estimated: 55 mL/min — ABNORMAL LOW (ref >60)
Glucose, Bld: 157 mg/dL — ABNORMAL HIGH (ref 70–99)
Potassium: 4 mmol/L (ref 3.5–5.1)
Sodium: 140 mmol/L (ref 135–145)

## 2024-05-29 NOTE — Telephone Encounter (Signed)
 [facility] unable to access Pemazyre . Rx redirected to [facility] medication will be delivered to patient on 05/31/24.

## 2024-05-31 ENCOUNTER — Ambulatory Visit: Payer: Self-pay

## 2024-05-31 ENCOUNTER — Telehealth: Payer: Self-pay | Admitting: *Deleted

## 2024-05-31 ENCOUNTER — Other Ambulatory Visit: Payer: Self-pay | Admitting: Oncology

## 2024-05-31 DIAGNOSIS — C221 Intrahepatic bile duct carcinoma: Secondary | ICD-10-CM

## 2024-05-31 NOTE — Telephone Encounter (Signed)
 Copied from CRM [id number]. Topic: General - Other >> May 31, 2024  4:48 PM Lavanda D wrote: Reason for CRM: [place] RN with [facility], she said he has left arm swelling that started last week. He was seen by Cardiology this week with no changes, she also left a message for his oncologist. Other than that he has no symptoms. They are doing Uniboot to his lower extremities but he doesn't have any wounds, she is wondering if they can switch to compression stockings. Forwarded message to NT as the RN was not with the patient at the moment.

## 2024-05-31 NOTE — Progress Notes (Unsigned)
 No chief complaint on file.  Patient presents with complaint of L arm swelling  left arm swelling that started last week.  Swelling has reduced/improved after wearing the sleeve that came with the infusion kit   Denies pain or fever  He was in the hospital 6/25 to 05/09/24 with septic shock/group B strep bacteremia-completing outpatient ceftriaxone .  Last dose will be on 06/02/24. 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 Not yet started.         PMH, PSH, SH reviewed   ROS:    PHYSICAL EXAM:  There were no vitals taken for this visit.      ASSESSMENT/PLAN:   Which side is PICC? ABX through 7/25, then PICC To be removed

## 2024-05-31 NOTE — Telephone Encounter (Signed)
  FYI Only or Action Required?: FYI only for provider.  Patient was last seen in primary care on 04/17/2024 by [name], MD.  Called Nurse Triage reporting Arm Swelling.  Symptoms began several days ago.  Interventions attempted: Rest, hydration, or home remedies.  Symptoms are: gradually improving.  Triage Disposition: See Physician Within 24 Hours  Patient/caregiver understands and will follow disposition?: Yes   Reason for Triage: Arm Swelling: [place] RN with [facility] called to advise the following on Eduardo [name]: she said he has left arm swelling that started last week. He was seen by Cardiology this week with no changes, she also left a message for his oncologist. Other than that he has no symptoms. She is requesting someone to give Eduardo [name] a call back for the arm swelling + any follow-up steps.   Reason for Disposition  MODERATE arm swelling (e.g., puffiness or swollen feeling of entire arm)  Answer Assessment - Initial Assessment Questions 1. ONSET: When did the swelling start? (e.g., minutes, hours, days)     One week ago 2. LOCATION: What part of the arm is swollen?  Are both arms swollen or just one arm?     Left hand, wrist and half up forearm swelling 3. SEVERITY: How bad is the swelling? (e.g., localized; mild, moderate, severe)     States that his watch, which is normally loose, became tight. Swelling has reduced/improved after wearing the sleeve that came with the infusion kit 4. REDNESS: Is there redness or signs of infection?     Patient states that there is one large red blotch under his left pinky and some small ones on the back of his hand (redness present for a couple of weeks) 5. PAIN: Is the swelling painful to touch? If Yes, ask: How painful is it?   (Scale 1-10; mild, moderate or severe)     denies 6. FEVER: Do you have a fever? If Yes, ask: What is it, how was it measured, and when did it start?      denies 7. CAUSE: What do  you think is causing the arm swelling?     unsure 8. MEDICAL HISTORY: Do you have a history of heart failure, kidney disease, liver failure, or cancer?     Currently being treated for cancer 9. RECURRENT SYMPTOM: Have you had arm swelling before? If Yes, ask: When was the last time? What happened that time?     denies 10. OTHER SYMPTOMS: Do you have any other symptoms? (e.g., chest pain, difficulty breathing)       denies  Protocols used: Arm Swelling and Edema-A-AH

## 2024-05-31 NOTE — Telephone Encounter (Signed)
 Is the swelling in the arm with the PICC line? Any redness, warmth or fever? I see that patient is scheduled to see me tomorrow afternoon. Will evaluate at visit.

## 2024-05-31 NOTE — Telephone Encounter (Signed)
 Received call from [place], [facility] RN with [facility] requesting pull PICC orders. Notified her that orders were given to [facility] to pull PICC after last dose on 7/25.  Enoc Getter, BSN, RN

## 2024-06-01 ENCOUNTER — Ambulatory Visit: Admitting: Family Medicine

## 2024-06-01 ENCOUNTER — Telehealth: Payer: Self-pay | Admitting: *Deleted

## 2024-06-01 ENCOUNTER — Encounter: Payer: Self-pay | Admitting: Family Medicine

## 2024-06-01 VITALS — BP 100/60 | HR 72 | Ht 69.0 in | Wt 218.8 lb

## 2024-06-01 DIAGNOSIS — R7881 Bacteremia: Secondary | ICD-10-CM

## 2024-06-01 DIAGNOSIS — T148XXA Other injury of unspecified body region, initial encounter: Secondary | ICD-10-CM

## 2024-06-01 DIAGNOSIS — M7989 Other specified soft tissue disorders: Secondary | ICD-10-CM

## 2024-06-01 DIAGNOSIS — I5022 Chronic systolic (congestive) heart failure: Secondary | ICD-10-CM

## 2024-06-01 DIAGNOSIS — Z89511 Acquired absence of right leg below knee: Secondary | ICD-10-CM

## 2024-06-01 DIAGNOSIS — C221 Intrahepatic bile duct carcinoma: Secondary | ICD-10-CM | POA: Diagnosis not present

## 2024-06-01 DIAGNOSIS — B951 Streptococcus, group B, as the cause of diseases classified elsewhere: Secondary | ICD-10-CM | POA: Diagnosis not present

## 2024-06-01 DIAGNOSIS — R6520 Severe sepsis without septic shock: Secondary | ICD-10-CM | POA: Diagnosis not present

## 2024-06-01 DIAGNOSIS — A419 Sepsis, unspecified organism: Secondary | ICD-10-CM | POA: Diagnosis not present

## 2024-06-01 NOTE — Patient Instructions (Signed)
 Please do daily weight at home, record, and contact cardiology folks if significant changes.  Continue to limit fluids as directed, and limit sodium intake.  Your swelling doesn't appear to be due to anything serious (blood clot, infection). The fact that it has improved some is reassuring. Be sure to elevate your arm above the level of your heart if swelling recurs.  Stay positive.

## 2024-06-01 NOTE — Telephone Encounter (Signed)
 Home health nurse saw patient yesterday pm and noted that his left arm is swollen. He reports this started last week (PICC is in right arm). PICC being used for IV antibiotics. Says he is not SOB. Saw cardiology Monday.  Informed her that he has an appointment with PCP today to assess the swelling.

## 2024-06-03 DIAGNOSIS — I472 Ventricular tachycardia, unspecified: Secondary | ICD-10-CM | POA: Diagnosis not present

## 2024-06-03 DIAGNOSIS — I429 Cardiomyopathy, unspecified: Secondary | ICD-10-CM | POA: Diagnosis not present

## 2024-06-03 DIAGNOSIS — E1140 Type 2 diabetes mellitus with diabetic neuropathy, unspecified: Secondary | ICD-10-CM | POA: Diagnosis not present

## 2024-06-03 DIAGNOSIS — Z794 Long term (current) use of insulin: Secondary | ICD-10-CM | POA: Diagnosis not present

## 2024-06-03 DIAGNOSIS — Z792 Long term (current) use of antibiotics: Secondary | ICD-10-CM | POA: Diagnosis not present

## 2024-06-03 DIAGNOSIS — I447 Left bundle-branch block, unspecified: Secondary | ICD-10-CM | POA: Diagnosis not present

## 2024-06-03 DIAGNOSIS — I130 Hypertensive heart and chronic kidney disease with heart failure and stage 1 through stage 4 chronic kidney disease, or unspecified chronic kidney disease: Secondary | ICD-10-CM | POA: Diagnosis not present

## 2024-06-03 DIAGNOSIS — E11319 Type 2 diabetes mellitus with unspecified diabetic retinopathy without macular edema: Secondary | ICD-10-CM | POA: Diagnosis not present

## 2024-06-03 DIAGNOSIS — I5022 Chronic systolic (congestive) heart failure: Secondary | ICD-10-CM | POA: Diagnosis not present

## 2024-06-03 DIAGNOSIS — N1832 Chronic kidney disease, stage 3b: Secondary | ICD-10-CM | POA: Diagnosis not present

## 2024-06-03 DIAGNOSIS — Z7902 Long term (current) use of antithrombotics/antiplatelets: Secondary | ICD-10-CM | POA: Diagnosis not present

## 2024-06-03 DIAGNOSIS — I50812 Chronic right heart failure: Secondary | ICD-10-CM | POA: Diagnosis not present

## 2024-06-03 DIAGNOSIS — Z6823 Body mass index (BMI) 23.0-23.9, adult: Secondary | ICD-10-CM | POA: Diagnosis not present

## 2024-06-03 DIAGNOSIS — C221 Intrahepatic bile duct carcinoma: Secondary | ICD-10-CM | POA: Diagnosis not present

## 2024-06-03 DIAGNOSIS — G8929 Other chronic pain: Secondary | ICD-10-CM | POA: Diagnosis not present

## 2024-06-03 DIAGNOSIS — I330 Acute and subacute infective endocarditis: Secondary | ICD-10-CM | POA: Diagnosis not present

## 2024-06-03 DIAGNOSIS — E1122 Type 2 diabetes mellitus with diabetic chronic kidney disease: Secondary | ICD-10-CM | POA: Diagnosis not present

## 2024-06-03 DIAGNOSIS — Z95810 Presence of automatic (implantable) cardiac defibrillator: Secondary | ICD-10-CM | POA: Diagnosis not present

## 2024-06-03 DIAGNOSIS — E1151 Type 2 diabetes mellitus with diabetic peripheral angiopathy without gangrene: Secondary | ICD-10-CM | POA: Diagnosis not present

## 2024-06-03 DIAGNOSIS — A401 Sepsis due to streptococcus, group B: Secondary | ICD-10-CM | POA: Diagnosis not present

## 2024-06-03 DIAGNOSIS — E7800 Pure hypercholesterolemia, unspecified: Secondary | ICD-10-CM | POA: Diagnosis not present

## 2024-06-03 DIAGNOSIS — Z452 Encounter for adjustment and management of vascular access device: Secondary | ICD-10-CM | POA: Diagnosis not present

## 2024-06-05 DIAGNOSIS — E1140 Type 2 diabetes mellitus with diabetic neuropathy, unspecified: Secondary | ICD-10-CM | POA: Diagnosis not present

## 2024-06-05 DIAGNOSIS — I130 Hypertensive heart and chronic kidney disease with heart failure and stage 1 through stage 4 chronic kidney disease, or unspecified chronic kidney disease: Secondary | ICD-10-CM | POA: Diagnosis not present

## 2024-06-05 DIAGNOSIS — Z95810 Presence of automatic (implantable) cardiac defibrillator: Secondary | ICD-10-CM | POA: Diagnosis not present

## 2024-06-05 DIAGNOSIS — I447 Left bundle-branch block, unspecified: Secondary | ICD-10-CM | POA: Diagnosis not present

## 2024-06-05 DIAGNOSIS — E1151 Type 2 diabetes mellitus with diabetic peripheral angiopathy without gangrene: Secondary | ICD-10-CM | POA: Diagnosis not present

## 2024-06-05 DIAGNOSIS — I429 Cardiomyopathy, unspecified: Secondary | ICD-10-CM | POA: Diagnosis not present

## 2024-06-05 DIAGNOSIS — Z452 Encounter for adjustment and management of vascular access device: Secondary | ICD-10-CM | POA: Diagnosis not present

## 2024-06-05 DIAGNOSIS — G8929 Other chronic pain: Secondary | ICD-10-CM | POA: Diagnosis not present

## 2024-06-05 DIAGNOSIS — E11319 Type 2 diabetes mellitus with unspecified diabetic retinopathy without macular edema: Secondary | ICD-10-CM | POA: Diagnosis not present

## 2024-06-05 DIAGNOSIS — A401 Sepsis due to streptococcus, group B: Secondary | ICD-10-CM | POA: Diagnosis not present

## 2024-06-05 DIAGNOSIS — Z7902 Long term (current) use of antithrombotics/antiplatelets: Secondary | ICD-10-CM | POA: Diagnosis not present

## 2024-06-05 DIAGNOSIS — I5022 Chronic systolic (congestive) heart failure: Secondary | ICD-10-CM | POA: Diagnosis not present

## 2024-06-05 DIAGNOSIS — I472 Ventricular tachycardia, unspecified: Secondary | ICD-10-CM | POA: Diagnosis not present

## 2024-06-05 DIAGNOSIS — I50812 Chronic right heart failure: Secondary | ICD-10-CM | POA: Diagnosis not present

## 2024-06-05 DIAGNOSIS — C221 Intrahepatic bile duct carcinoma: Secondary | ICD-10-CM | POA: Diagnosis not present

## 2024-06-05 DIAGNOSIS — E1122 Type 2 diabetes mellitus with diabetic chronic kidney disease: Secondary | ICD-10-CM | POA: Diagnosis not present

## 2024-06-05 DIAGNOSIS — Z794 Long term (current) use of insulin: Secondary | ICD-10-CM | POA: Diagnosis not present

## 2024-06-05 DIAGNOSIS — I330 Acute and subacute infective endocarditis: Secondary | ICD-10-CM | POA: Diagnosis not present

## 2024-06-05 DIAGNOSIS — Z792 Long term (current) use of antibiotics: Secondary | ICD-10-CM | POA: Diagnosis not present

## 2024-06-05 DIAGNOSIS — N1832 Chronic kidney disease, stage 3b: Secondary | ICD-10-CM | POA: Diagnosis not present

## 2024-06-05 DIAGNOSIS — Z6823 Body mass index (BMI) 23.0-23.9, adult: Secondary | ICD-10-CM | POA: Diagnosis not present

## 2024-06-05 DIAGNOSIS — E7800 Pure hypercholesterolemia, unspecified: Secondary | ICD-10-CM | POA: Diagnosis not present

## 2024-06-07 ENCOUNTER — Other Ambulatory Visit (HOSPITAL_BASED_OUTPATIENT_CLINIC_OR_DEPARTMENT_OTHER): Payer: Self-pay

## 2024-06-07 ENCOUNTER — Other Ambulatory Visit (HOSPITAL_COMMUNITY): Payer: Self-pay

## 2024-06-08 ENCOUNTER — Other Ambulatory Visit (HOSPITAL_COMMUNITY): Payer: Self-pay

## 2024-06-08 ENCOUNTER — Inpatient Hospital Stay

## 2024-06-08 ENCOUNTER — Inpatient Hospital Stay (HOSPITAL_BASED_OUTPATIENT_CLINIC_OR_DEPARTMENT_OTHER): Admitting: Oncology

## 2024-06-08 ENCOUNTER — Other Ambulatory Visit (HOSPITAL_COMMUNITY): Payer: Self-pay | Admitting: Cardiology

## 2024-06-08 ENCOUNTER — Other Ambulatory Visit (HOSPITAL_BASED_OUTPATIENT_CLINIC_OR_DEPARTMENT_OTHER): Payer: Self-pay

## 2024-06-08 VITALS — BP 106/68 | HR 81 | Temp 98.2°F | Resp 18 | Ht 69.0 in | Wt 206.8 lb

## 2024-06-08 DIAGNOSIS — I255 Ischemic cardiomyopathy: Secondary | ICD-10-CM | POA: Diagnosis not present

## 2024-06-08 DIAGNOSIS — Z452 Encounter for adjustment and management of vascular access device: Secondary | ICD-10-CM | POA: Diagnosis not present

## 2024-06-08 DIAGNOSIS — I2510 Atherosclerotic heart disease of native coronary artery without angina pectoris: Secondary | ICD-10-CM | POA: Diagnosis not present

## 2024-06-08 DIAGNOSIS — E1140 Type 2 diabetes mellitus with diabetic neuropathy, unspecified: Secondary | ICD-10-CM | POA: Diagnosis not present

## 2024-06-08 DIAGNOSIS — C221 Intrahepatic bile duct carcinoma: Secondary | ICD-10-CM | POA: Diagnosis not present

## 2024-06-08 DIAGNOSIS — R7881 Bacteremia: Secondary | ICD-10-CM | POA: Diagnosis not present

## 2024-06-08 DIAGNOSIS — E1122 Type 2 diabetes mellitus with diabetic chronic kidney disease: Secondary | ICD-10-CM | POA: Diagnosis not present

## 2024-06-08 DIAGNOSIS — N189 Chronic kidney disease, unspecified: Secondary | ICD-10-CM | POA: Diagnosis not present

## 2024-06-08 DIAGNOSIS — I130 Hypertensive heart and chronic kidney disease with heart failure and stage 1 through stage 4 chronic kidney disease, or unspecified chronic kidney disease: Secondary | ICD-10-CM | POA: Diagnosis not present

## 2024-06-08 DIAGNOSIS — B951 Streptococcus, group B, as the cause of diseases classified elsewhere: Secondary | ICD-10-CM | POA: Diagnosis not present

## 2024-06-08 LAB — CBC WITH DIFFERENTIAL (CANCER CENTER ONLY)
Abs Immature Granulocytes: 0.02 K/uL (ref 0.00–0.07)
Basophils Absolute: 0 K/uL (ref 0.0–0.1)
Basophils Relative: 1 %
Eosinophils Absolute: 0.1 K/uL (ref 0.0–0.5)
Eosinophils Relative: 1 %
HCT: 42.5 % (ref 39.0–52.0)
Hemoglobin: 13.5 g/dL (ref 13.0–17.0)
Immature Granulocytes: 0 %
Lymphocytes Relative: 22 %
Lymphs Abs: 1.1 K/uL (ref 0.7–4.0)
MCH: 29.2 pg (ref 26.0–34.0)
MCHC: 31.8 g/dL (ref 30.0–36.0)
MCV: 91.8 fL (ref 80.0–100.0)
Monocytes Absolute: 0.3 K/uL (ref 0.1–1.0)
Monocytes Relative: 7 %
Neutro Abs: 3.4 K/uL (ref 1.7–7.7)
Neutrophils Relative %: 69 %
Platelet Count: 152 K/uL (ref 150–400)
RBC: 4.63 MIL/uL (ref 4.22–5.81)
RDW: 17.5 % — ABNORMAL HIGH (ref 11.5–15.5)
WBC Count: 4.9 K/uL (ref 4.0–10.5)
nRBC: 0 % (ref 0.0–0.2)

## 2024-06-08 LAB — CMP (CANCER CENTER ONLY)
ALT: 13 U/L (ref 0–44)
AST: 29 U/L (ref 15–41)
Albumin: 3.9 g/dL (ref 3.5–5.0)
Alkaline Phosphatase: 142 U/L — ABNORMAL HIGH (ref 38–126)
Anion gap: 5 (ref 5–15)
BUN: 42 mg/dL — ABNORMAL HIGH (ref 8–23)
CO2: 38 mmol/L — ABNORMAL HIGH (ref 22–32)
Calcium: 9.9 mg/dL (ref 8.9–10.3)
Chloride: 99 mmol/L (ref 98–111)
Creatinine: 1.51 mg/dL — ABNORMAL HIGH (ref 0.61–1.24)
GFR, Estimated: 5 mL/min — ABNORMAL LOW (ref >60)
Glucose, Bld: 174 mg/dL — ABNORMAL HIGH (ref 70–99)
Potassium: 4.3 mmol/L (ref 3.5–5.1)
Sodium: 142 mmol/L (ref 135–145)
Total Bilirubin: 0.8 mg/dL (ref 0.0–1.2)
Total Protein: 6.7 g/dL (ref 6.5–8.1)

## 2024-06-08 LAB — URIC ACID: Uric Acid, Serum: 4.9 mg/dL (ref 3.7–8.6)

## 2024-06-08 LAB — PHOSPHORUS: Phosphorus: 6.6 mg/dL — ABNORMAL HIGH (ref 2.5–4.6)

## 2024-06-08 MED ORDER — AMIODARONE HCL 200 MG PO TABS: 200.0000 mg | ORAL_TABLET | Freq: Every day | ORAL | 11 refills | Status: DC

## 2024-06-08 NOTE — Progress Notes (Signed)
 [facility] OFFICE PROGRESS NOTE   Diagnosis: Cholangiocarcinoma  INTERVAL HISTORY:   Eduardo [name] returns as scheduled.  He began pemigatinib  on 05/31/2024.  No rash, nausea, diarrhea, or eye symptoms.  He feels well.  He relates weight loss to fluid loss.  Good appetite.  Objective:  Vital signs in last 24 hours:  Blood pressure 106/68, pulse 81, temperature 98.2 F (36.8 C), temperature source Temporal, resp. rate 18, height 5' 9 (1.753 m), weight 206 lb 12.8 oz (93.8 kg), SpO2 99%.    HEENT: No thrush or ulcers Resp: Lungs clear bilaterally Cardio: Regular rate and rhythm with premature beats GI: No hepatosplenomegaly Vascular: Trace edema to left lower leg and abdominal wall     Lab Results:  Lab Results  Component Value Date   WBC 4.9 06/08/2024   HGB 13.5 06/08/2024   HCT 42.5 06/08/2024   MCV 91.8 06/08/2024   PLT 152 06/08/2024   NEUTROABS 3.4 06/08/2024    CMP  Lab Results  Component Value Date   NA 140 05/29/2024   K 4.0 05/29/2024   CL 102 05/29/2024   CO2 21 (L) 05/29/2024   GLUCOSE 157 (H) 05/29/2024   BUN 32 (H) 05/29/2024   CREATININE 1.39 (H) 05/29/2024   CALCIUM  9.1 05/29/2024   PROT 6.1 (L) 05/23/2024   ALBUMIN  3.6 05/23/2024   AST 36 05/23/2024   ALT 16 05/23/2024   ALKPHOS 149 (H) 05/23/2024   BILITOT 0.5 05/23/2024   GFRNONAA 55 (L) 05/29/2024   GFRAA 44 (L) 12/19/2020     Medications: I have reviewed the patient's current medications.   Assessment/Plan: Cholangiocarcinoma CT abdomen/pelvis 11/14/2023-closed-loop small bowel obstruction, 8.8 cm hypoattenuating lesion in the posterior right liver with suggestion of nodular peripheral enhancement, small bilateral pleural effusions MRI abdomen 01/27/2024-rim-enhancing centrally hypoattenuating mass in the posterior right liver segment 7 measuring 11 x 8.4 cm, increased in size compared to the 11/14/2023 CT consistent with a necrotic metastasis or primary liver mass, severe  anasarca, small volume ascites, bilateral pleural effusions, mild splenomegaly CT abdomen/pelvis 02/14/2024-no change in complex right liver lesion, small bilateral pleural effusions left greater than right, soft tissue edema of the anterior abdominal wall Biopsy liver lesion 03/06/2024-benign hepatic parenchyma with reactive changes CT chest 03/09/2024-a few scattered small lung nodules measuring up to 4 mm right lower and left upper lobes.  Mild cardiomegaly.  Stable right and moderate left pleural effusions.  Anasarca.  Similar large hypodense mass filling the posterior right hepatic lobe. 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  He began treatment with Pemigatinib  05/31/2024.  He is tolerating the pemigatinib  well to date.  He is scheduled for an ophthalmology appointment next week.  He will complete pemigatinib  over the next week.  He will return  for an office visit prior to  cycle 2.  He will follow-up with cardiology regarding the indication for continuing amiodarone .  Arley Hof, MD  06/08/2024  10:30 AM

## 2024-06-09 ENCOUNTER — Other Ambulatory Visit: Payer: Self-pay | Admitting: Nurse Practitioner

## 2024-06-09 DIAGNOSIS — C221 Intrahepatic bile duct carcinoma: Secondary | ICD-10-CM

## 2024-06-12 DIAGNOSIS — Z95810 Presence of automatic (implantable) cardiac defibrillator: Secondary | ICD-10-CM | POA: Diagnosis not present

## 2024-06-12 DIAGNOSIS — I130 Hypertensive heart and chronic kidney disease with heart failure and stage 1 through stage 4 chronic kidney disease, or unspecified chronic kidney disease: Secondary | ICD-10-CM | POA: Diagnosis not present

## 2024-06-12 DIAGNOSIS — Z6823 Body mass index (BMI) 23.0-23.9, adult: Secondary | ICD-10-CM | POA: Diagnosis not present

## 2024-06-12 DIAGNOSIS — E1151 Type 2 diabetes mellitus with diabetic peripheral angiopathy without gangrene: Secondary | ICD-10-CM | POA: Diagnosis not present

## 2024-06-12 DIAGNOSIS — C221 Intrahepatic bile duct carcinoma: Secondary | ICD-10-CM | POA: Diagnosis not present

## 2024-06-12 DIAGNOSIS — Z792 Long term (current) use of antibiotics: Secondary | ICD-10-CM | POA: Diagnosis not present

## 2024-06-12 DIAGNOSIS — I429 Cardiomyopathy, unspecified: Secondary | ICD-10-CM | POA: Diagnosis not present

## 2024-06-12 DIAGNOSIS — I447 Left bundle-branch block, unspecified: Secondary | ICD-10-CM | POA: Diagnosis not present

## 2024-06-12 DIAGNOSIS — Z794 Long term (current) use of insulin: Secondary | ICD-10-CM | POA: Diagnosis not present

## 2024-06-12 DIAGNOSIS — Z7902 Long term (current) use of antithrombotics/antiplatelets: Secondary | ICD-10-CM | POA: Diagnosis not present

## 2024-06-12 DIAGNOSIS — N1832 Chronic kidney disease, stage 3b: Secondary | ICD-10-CM | POA: Diagnosis not present

## 2024-06-12 DIAGNOSIS — E7800 Pure hypercholesterolemia, unspecified: Secondary | ICD-10-CM | POA: Diagnosis not present

## 2024-06-12 DIAGNOSIS — Z452 Encounter for adjustment and management of vascular access device: Secondary | ICD-10-CM | POA: Diagnosis not present

## 2024-06-12 DIAGNOSIS — E11319 Type 2 diabetes mellitus with unspecified diabetic retinopathy without macular edema: Secondary | ICD-10-CM | POA: Diagnosis not present

## 2024-06-12 DIAGNOSIS — E1122 Type 2 diabetes mellitus with diabetic chronic kidney disease: Secondary | ICD-10-CM | POA: Diagnosis not present

## 2024-06-12 DIAGNOSIS — I330 Acute and subacute infective endocarditis: Secondary | ICD-10-CM | POA: Diagnosis not present

## 2024-06-12 DIAGNOSIS — I5022 Chronic systolic (congestive) heart failure: Secondary | ICD-10-CM | POA: Diagnosis not present

## 2024-06-12 DIAGNOSIS — I472 Ventricular tachycardia, unspecified: Secondary | ICD-10-CM | POA: Diagnosis not present

## 2024-06-12 DIAGNOSIS — G8929 Other chronic pain: Secondary | ICD-10-CM | POA: Diagnosis not present

## 2024-06-12 DIAGNOSIS — E1140 Type 2 diabetes mellitus with diabetic neuropathy, unspecified: Secondary | ICD-10-CM | POA: Diagnosis not present

## 2024-06-12 DIAGNOSIS — I50812 Chronic right heart failure: Secondary | ICD-10-CM | POA: Diagnosis not present

## 2024-06-12 DIAGNOSIS — A401 Sepsis due to streptococcus, group B: Secondary | ICD-10-CM | POA: Diagnosis not present

## 2024-06-12 NOTE — Progress Notes (Signed)
 Remote ICD transmission.

## 2024-06-14 ENCOUNTER — Encounter (INDEPENDENT_AMBULATORY_CARE_PROVIDER_SITE_OTHER): Payer: Medicare Other | Admitting: Ophthalmology

## 2024-06-15 ENCOUNTER — Other Ambulatory Visit: Payer: Self-pay | Admitting: Nurse Practitioner

## 2024-06-15 ENCOUNTER — Other Ambulatory Visit: Payer: Self-pay

## 2024-06-15 ENCOUNTER — Inpatient Hospital Stay: Attending: Nurse Practitioner

## 2024-06-15 ENCOUNTER — Other Ambulatory Visit (HOSPITAL_BASED_OUTPATIENT_CLINIC_OR_DEPARTMENT_OTHER): Payer: Self-pay

## 2024-06-15 ENCOUNTER — Telehealth: Payer: Self-pay | Admitting: *Deleted

## 2024-06-15 DIAGNOSIS — C221 Intrahepatic bile duct carcinoma: Secondary | ICD-10-CM

## 2024-06-15 DIAGNOSIS — R630 Anorexia: Secondary | ICD-10-CM | POA: Diagnosis not present

## 2024-06-15 DIAGNOSIS — I5022 Chronic systolic (congestive) heart failure: Secondary | ICD-10-CM | POA: Insufficient documentation

## 2024-06-15 DIAGNOSIS — I255 Ischemic cardiomyopathy: Secondary | ICD-10-CM | POA: Diagnosis not present

## 2024-06-15 DIAGNOSIS — I130 Hypertensive heart and chronic kidney disease with heart failure and stage 1 through stage 4 chronic kidney disease, or unspecified chronic kidney disease: Secondary | ICD-10-CM | POA: Diagnosis not present

## 2024-06-15 DIAGNOSIS — N189 Chronic kidney disease, unspecified: Secondary | ICD-10-CM | POA: Insufficient documentation

## 2024-06-15 DIAGNOSIS — E1122 Type 2 diabetes mellitus with diabetic chronic kidney disease: Secondary | ICD-10-CM | POA: Insufficient documentation

## 2024-06-15 DIAGNOSIS — I2510 Atherosclerotic heart disease of native coronary artery without angina pectoris: Secondary | ICD-10-CM | POA: Insufficient documentation

## 2024-06-15 LAB — CMP (CANCER CENTER ONLY)
ALT: 28 U/L (ref 0–44)
AST: 54 U/L — ABNORMAL HIGH (ref 15–41)
Albumin: 4 g/dL (ref 3.5–5.0)
Alkaline Phosphatase: 148 U/L — ABNORMAL HIGH (ref 38–126)
Anion gap: 19 — ABNORMAL HIGH (ref 5–15)
BUN: 59 mg/dL — ABNORMAL HIGH (ref 8–23)
CO2: 24 mmol/L (ref 22–32)
Calcium: 10.2 mg/dL (ref 8.9–10.3)
Chloride: 96 mmol/L — ABNORMAL LOW (ref 98–111)
Creatinine: 2.22 mg/dL — ABNORMAL HIGH (ref 0.61–1.24)
GFR, Estimated: 31 mL/min — ABNORMAL LOW (ref >60)
Glucose, Bld: 151 mg/dL — ABNORMAL HIGH (ref 70–99)
Potassium: 4.6 mmol/L (ref 3.5–5.1)
Sodium: 139 mmol/L (ref 135–145)
Total Bilirubin: 1.2 mg/dL (ref 0.0–1.2)
Total Protein: 7.1 g/dL (ref 6.5–8.1)

## 2024-06-15 LAB — PHOSPHORUS: Phosphorus: 8.2 mg/dL — ABNORMAL HIGH (ref 2.5–4.6)

## 2024-06-15 MED ORDER — SEVELAMER CARBONATE 800 MG PO TABS
1600.0000 mg | ORAL_TABLET | Freq: Three times a day (TID) | ORAL | 2 refills | Status: DC
  Filled 2024-06-15: qty 180, 30d supply, fill #0

## 2024-06-15 NOTE — Telephone Encounter (Signed)
 Called Eduardo [name] to inform him is phoshorous is still elevated and script for Renvela  was sent to [facility]. Needs to start asap and return on Monday am for repeat labs. Informed him OK to resume his pemigatinib  on 8/13 as scheduled (reports last dose was 8/7). Scheduling message sent for 8/11 lab appointment.

## 2024-06-16 ENCOUNTER — Telehealth: Payer: Self-pay

## 2024-06-16 NOTE — Progress Notes (Addendum)
 ADVANCED HF CLINIC NOTE   Primary Care: Eduardo Dawes, MD Primary Cardiologist: Eduardo JINNY Lawrence, MD HF Cardiologist: Dr. Cherrie  HPI: Eduardo [name] is 68 y.o. male with severe 3V CAD (medically managed), R SFA occlusion s/p PTA with 3 overlapping DES, DM2, R BKA, CKD IIIa, HTN, HLD and systolic HF due to iCM.   Presented to [facility] 12/21 with acute hypoxic respiratory failure due to pulmonary edema. Intubated in ER. He was found to have new LBBB with pulmonary edema and code STEMI called. He was taken to cath lab found to EF 15-20%, LVEDP 29 and severe native three-vessel coronary artery  disease (LAD 100%p LCX all marginals occluded< RCA diffuse distal 99%) - with no obvious culprit lesion and no targets for revascularization. Hstrop peaked at 27k. Admitted to CCU for management of cardiogenic shock. Started on IOWA.  Echo EF 20-25% RV normal. He stabilized relatively quickly and was discharged home.    Seen in VAD Clinic 1/22 and decided against VAD.   Readmitted 12/28/20 with ADHF. EF 15%. Went for cath with Dr. Lawrence and Dr. Ladona. Cath with 100% LAD. OM-1 100% Distal RCA 99% Attempted CTO of LAD with Impella support but unsuccessful.  Of note, he underwent small bowel resection and anastomosis for small bowel obstruction where liver mass was noted. He underwent biopsy which showed poorly differentiated carcinoma, most consistent with cholangiocarcinoma.     Admitted 5/25 with acute on chronic biventricular HF. Echo showed EF 20-25% and new severely reduced RV, mild MR. RHC showed severely elevated left sided filling pressures with PCWP of 29 and reduced TD CI of 1.8. Required pressor and inotrope support to diurese. Required midodrine  to fully wean off inotrope. No GDMT with soft BP. With recent diagnosis of cholangiocarcinoma, he was deemed not a candidate for VAD and he previously declined.   Admitted 6/25 with sepsis 2/2 GBS bacteremia. Echo showed EF 20-25%, RV severely reduced,  moderate MR, Given patient's risk factors, elected to avoid repeat interventions or procedures including TEE and ICD evaluation for removal/replacement. 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 PICC placed, and ceftriaxone  to continue through 06/02/24. Seen by PMT, remained DNR with plans for possible outpatient Palliative/hospice follow up. He was discharged home, amio stopped, weight 220 lbs.   Follow up 05/16/24, volume overloaded. Instructed to use Furoscix   + 40 KCL daily x 3 days, then after 3 days resume torsemide  30 bid + 40 KCL daily.  Today he returns for HF follow up with his wife. Overall feeling terrible. No appetite x 1 week, he is unsure if chemo causing symptoms. Started treatment with Pemigatinib  05/31/24. He is not SOB walking short distances on flat ground with his rolling walker. Just has no energy and no appetite. Denies palpitations, abnormal bleeding, CP, dizziness, or PND/Orthopnea. Weight at home 185 pounds. Taking all medications. Had unna wrap to LLE, now wearing zip-up compression hose. Did not notice much improvement using Furoscix , only able to use 2 packs.   Past Medical History:  Diagnosis Date   CHF (congestive heart failure) (HCC)    Chronic kidney disease    stage 3   Colon polyp    Coronary artery disease    Diabetes mellitus 1987   under care of Dr. Tommas.  On insulin  since 96 (off and on)   Diabetic retinopathy    Dupuytren contracture    R hand, s/p injection (Dr. Lorretta)   Essential hypertension, benign    Essential hypertension, benign 02/06/2019   Frequency  of urination and polyuria    Hypertension    Myocardial infarction [facility])    denies   Neuromuscular disorder (HCC)    Diabetic neuropathy   Osteomyelitis (HCC)    right foot   Other testicular hypofunction    Peripheral arterial disease (HCC) 10/28/2012   Peritoneal abscess (HCC) 6/08   and buttock.   Pneumonia    Polydipsia    Proteinuria    Pure hyperglyceridemia    Subacute  osteomyelitis, right ankle and foot (HCC)    Wears glasses    Current Outpatient Medications  Medication Sig Dispense Refill   amiodarone  (PACERONE ) 200 MG tablet Take 1 tablet (200 mg total) by mouth daily. 30 tablet 11   Bioflavonoid Products (ESTER C PO) Take 1,000 mg by mouth daily.     Cholecalciferol  (VITAMIN D ) 2000 units CAPS Take 2,000 Units by mouth daily.     clopidogrel  (PLAVIX ) 75 MG tablet TAKE 1 TABLET BY MOUTH DAILY 100 tablet 1   Continuous Glucose Sensor (FREESTYLE LIBRE 2 PLUS SENSOR) MISC 1 Application.     ezetimibe  (ZETIA ) 10 MG tablet TAKE 1 TABLET BY MOUTH DAILY 100 tablet 1   famotidine  (PEPCID ) 10 MG tablet Take 10 mg by mouth 2 (two) times daily as needed for heartburn or indigestion.     gemfibrozil  (LOPID ) 600 MG tablet Take 600 mg by mouth 2 (two) times daily before a meal.     HUMALOG KWIKPEN 100 UNIT/ML KiwkPen Inject 3-10 Units into the skin 3 (three) times daily. Inject 01-1009 units into the skin three times a day, per sliding scale- based on BGL >100  1   HUMULIN  N KWIKPEN 100 UNIT/ML KwikPen Inject 6 Units into the skin daily. If BS is under 150 do not take per provider 45 until 15 mg am lunch 15 mg and night 15mg      Krill Oil 300 MG CAPS Take 500 mg by mouth daily.     lidocaine  (XYLOCAINE ) 5 % ointment Apply 1 Application topically as needed. For pain as needed- can use up to 4x/day 50 g 5   melatonin 10 MG TABS Take 10 mg by mouth at bedtime.  0   midodrine  (PROAMATINE ) 5 MG tablet Take 3 tablets (15 mg total) by mouth 3 (three) times daily with meals. 270 tablet 11   Multiple Vitamin (MULTIVITAMIN WITH MINERALS) TABS tablet Take 1 tablet by mouth daily.     pemigatinib  (PEMAZYRE ) 9 MG tablet Take 1 tablet (9 mg total) by mouth daily. Take 1 tablet daily x 14 days on, then 7 day rest. Start on 06/15/24 14 tablet 0   potassium chloride  SA (KLOR-CON  M) 20 MEQ tablet Take 2 tablets (40 mEq total) by mouth daily.     Probiotic Product (PROBIOTIC-10 PO) Take 1  capsule by mouth daily.     prochlorperazine  (COMPAZINE ) 5 MG tablet Take 1 tablet (5 mg total) by mouth every 8 (eight) hours as needed for nausea or vomiting. 30 tablet 0   rosuvastatin  (CRESTOR ) 20 MG tablet Take 1 tablet (20 mg total) by mouth daily. 90 tablet 3   sevelamer  carbonate (RENVELA ) 800 MG tablet Take 2 tablets (1,600 mg total) by mouth 3 (three) times daily with meals. 180 tablet 2   sodium chloride  (OCEAN) 0.65 % SOLN nasal spray Place 1 spray into both nostrils as needed for congestion.     tamsulosin  (FLOMAX ) 0.4 MG CAPS capsule Take 1 capsule (0.4 mg total) by mouth daily after supper. 30 capsule  6   torsemide  (DEMADEX ) 10 MG tablet Take 3 tablets (30 mg total) by mouth 2 (two) times daily. 180 tablet 0   nitroGLYCERIN  (NITROSTAT ) 0.4 MG SL tablet Place 1 tablet (0.4 mg total) under the tongue every 5 (five) minutes as needed for chest pain. (Patient not taking: Reported on 06/20/2024) 15 tablet 3   No current facility-administered medications for this encounter.   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 Retired. 1 cat.  Girlfriend and her daughter lives with him. Enjoys fishing.   Updated 06/2021   Social Drivers of Health   Financial Resource Strain: Low Risk  (07/28/2023)   Overall Financial Resource Strain (CARDIA)    Difficulty of Paying Living Expenses: Not hard at all  Food Insecurity: No Food Insecurity (05/05/2024)   Hunger Vital Sign    Worried About Running Out of Food in the Last Year: Never true    Ran Out of Food in the Last Year: Never true  Transportation Needs: No Transportation Needs (05/05/2024)   PRAPARE - Administrator, Civil Service (Medical): No    Lack of Transportation (Non-Medical): No  Physical Activity: Sufficiently Active (07/28/2023)   Exercise Vital Sign    Days of Exercise per Week: 7 days    Minutes of Exercise per Session: 90 min  Stress: No Stress Concern Present (07/28/2023)   [employer] of Occupational Health - Occupational Stress Questionnaire    Feeling of Stress : Not at all  Social Connections: Socially Isolated (05/05/2024)   Social Connection and Isolation Panel    Frequency of Communication with Friends and Family: Never    Frequency of Social Gatherings with Friends and Family: Never    Attends Religious Services: Never    Database administrator or Organizations: No    Attends Banker Meetings: Never    Marital Status: Living with partner  Intimate Partner Violence: Not At Risk (05/05/2024)   Humiliation, Afraid, Rape, and Kick questionnaire    Fear of Current or Ex-Partner: No    Emotionally Abused: No    Physically Abused:  No    Sexually Abused: No   Family History  Problem Relation Age of Onset   Diabetes Mother    Hearing loss Mother     Hypertension Mother    Hyperlipidemia Mother    Heart disease Mother    Varicose Veins Mother    Varicose Veins Father    Dementia Father    Hyperlipidemia Brother    Diabetes Maternal Grandmother    Wt Readings from Last 3 Encounters:  06/20/24 91.6 kg (202 lb)  06/08/24 93.8 kg (206 lb 12.8 oz)  06/01/24 99.2 kg (218 lb 12.8 oz)   BP 98/62   Pulse 93   Ht 5' 9 (1.753 m)   Wt 91.6 kg (202 lb)   SpO2 95%   BMI 29.83 kg/m   PHYSICAL EXAM: General:  NAD. No resp difficulty, walked into clinic with RW, fatigued-appearing HEENT: Normal Neck: Supple. No JVD. Cor: Regular rate & rhythm. No rubs, gallops or murmurs. Lungs: Clear Abdomen: Soft, nontender, nondistended.  Extremities: No cyanosis, clubbing, rash, edema; R AKA Neuro: Alert & oriented x 3, moves all 4 extremities w/o difficulty. Affect pleasant.  St Jude ICD device interrogation (personally reviewed): CorVue stable, 95% BP, several episodes of PMT, No VT   ASSESSMENT & PLAN: 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 Longstanding history of severely reduced EF < 20-25% since 2021 with diastolic dysfunction and relatively ok RV. - Has CRT-D - Echo 5/25 EF 20-25% and new severely reduced RV, mild MR - RHC 5/25 (on 8 mcg/min of NE): showed severely elevated left sided filling pressures with PCWP of 29 and reduced TD CI of 1.8. - NYHA III, poor function status in setting on CA and HF. GDMT limited by fatigue and hypotension. Volume OK on CorVue. - Continue compression sleeve to Left leg - With poor PO intake, drop torsemide  to 40 mg bid, keep KCL 40 daily. - Continue midodrine  15 mg tid. No BP room to wean. - Previously worked up for VAD in 2022, did not want to pursue. At this point would not be a candidate for advanced therapies, goal is to allow him some quality time out of hospital.  He is now DNR.  - Labs 06/19/24 reviewed, K 4.6, SCr 2.09 - [facility]  2. 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 - TEE & ICD removal felt too high risk - IV Cefriaxine until 06/02/24 - Has follow up with ID  3. CAD - severe native three-vessel coronary artery disease (LAD 100%, pLCX all marginals occluded, RCA diffuse distal 99%)  - No chest pain.  - Continue Plavix .  - On Crestor , zetia  and gemfibrozil . Last LDL 29 and TGs 82. PCP following labs.  - Consider stopping gemfibrozil  if TGs remain at goal.   4. NSVT  - Continue amiodarone  200 mg daily.  - Frequent PMT - He has ICD  - LFT creeping up, TSH 11 but free T4 ok - With poor PO intake and palliative chemo, consider stopping amio. ? ICD de-activation, Will discuss with Dr. Bensimhon   5. CKD 3b - Baseline SCr 1.5-1.7. - Labs today  6. Cholangiocarcinoma of the liver - Noted on small bowel resection in 1/25, biopsy with cholangiocarcinoma - Not a surgical candidate currently.  - Started palliative pemigatinib  05/31/24 - Follow with [name]  Follow up in 8 weeks with Dr. Cherrie Raisin [facility] FNP-BC 06/20/24  [facility] [place] [address]  [facility] [place] [address] [phone] (office) [phone] (fax)

## 2024-06-16 NOTE — Telephone Encounter (Addendum)
  I contacted the patient to confirm his lab appointment on Monday. His spouse reports that he has no appetite and is experiencing significant fatigue. Yesterday, he only consumed water. She is concerned about his inadequate intake and requests that the provider consider prescribing medication to help improve his appetite.

## 2024-06-19 ENCOUNTER — Inpatient Hospital Stay

## 2024-06-19 ENCOUNTER — Telehealth (HOSPITAL_COMMUNITY): Payer: Self-pay | Admitting: *Deleted

## 2024-06-19 ENCOUNTER — Telehealth (HOSPITAL_COMMUNITY): Payer: Self-pay

## 2024-06-19 ENCOUNTER — Encounter: Payer: Self-pay | Admitting: Internal Medicine

## 2024-06-19 DIAGNOSIS — I130 Hypertensive heart and chronic kidney disease with heart failure and stage 1 through stage 4 chronic kidney disease, or unspecified chronic kidney disease: Secondary | ICD-10-CM | POA: Diagnosis not present

## 2024-06-19 DIAGNOSIS — C221 Intrahepatic bile duct carcinoma: Secondary | ICD-10-CM

## 2024-06-19 DIAGNOSIS — I2510 Atherosclerotic heart disease of native coronary artery without angina pectoris: Secondary | ICD-10-CM | POA: Diagnosis not present

## 2024-06-19 DIAGNOSIS — I5022 Chronic systolic (congestive) heart failure: Secondary | ICD-10-CM | POA: Diagnosis not present

## 2024-06-19 DIAGNOSIS — E1122 Type 2 diabetes mellitus with diabetic chronic kidney disease: Secondary | ICD-10-CM | POA: Diagnosis not present

## 2024-06-19 DIAGNOSIS — I255 Ischemic cardiomyopathy: Secondary | ICD-10-CM | POA: Diagnosis not present

## 2024-06-19 DIAGNOSIS — N189 Chronic kidney disease, unspecified: Secondary | ICD-10-CM | POA: Diagnosis not present

## 2024-06-19 LAB — COMPREHENSIVE METABOLIC PANEL WITH GFR
ALT: 97 U/L — ABNORMAL HIGH (ref 0–44)
AST: 93 U/L — ABNORMAL HIGH (ref 15–41)
Albumin: 3.7 g/dL (ref 3.5–5.0)
Alkaline Phosphatase: 138 U/L — ABNORMAL HIGH (ref 38–126)
Anion gap: 15 (ref 5–15)
BUN: 59 mg/dL — ABNORMAL HIGH (ref 8–23)
CO2: 25 mmol/L (ref 22–32)
Calcium: 10 mg/dL (ref 8.9–10.3)
Chloride: 96 mmol/L — ABNORMAL LOW (ref 98–111)
Creatinine, Ser: 2.09 mg/dL — ABNORMAL HIGH (ref 0.61–1.24)
GFR, Estimated: 34 mL/min — ABNORMAL LOW (ref >60)
Glucose, Bld: 149 mg/dL — ABNORMAL HIGH (ref 70–99)
Potassium: 4.6 mmol/L (ref 3.5–5.1)
Sodium: 137 mmol/L (ref 135–145)
Total Bilirubin: 1.2 mg/dL (ref 0.0–1.2)
Total Protein: 6.8 g/dL (ref 6.5–8.1)

## 2024-06-19 LAB — CBC WITH DIFFERENTIAL (CANCER CENTER ONLY)
Abs Immature Granulocytes: 0.02 K/uL (ref 0.00–0.07)
Basophils Absolute: 0 K/uL (ref 0.0–0.1)
Basophils Relative: 0 %
Eosinophils Absolute: 0.1 K/uL (ref 0.0–0.5)
Eosinophils Relative: 1 %
HCT: 43.2 % (ref 39.0–52.0)
Hemoglobin: 14 g/dL (ref 13.0–17.0)
Immature Granulocytes: 0 %
Lymphocytes Relative: 10 %
Lymphs Abs: 0.6 K/uL — ABNORMAL LOW (ref 0.7–4.0)
MCH: 29.5 pg (ref 26.0–34.0)
MCHC: 32.4 g/dL (ref 30.0–36.0)
MCV: 91.1 fL (ref 80.0–100.0)
Monocytes Absolute: 0.4 K/uL (ref 0.1–1.0)
Monocytes Relative: 7 %
Neutro Abs: 4.9 K/uL (ref 1.7–7.7)
Neutrophils Relative %: 82 %
Platelet Count: 167 K/uL (ref 150–400)
RBC: 4.74 MIL/uL (ref 4.22–5.81)
RDW: 18 % — ABNORMAL HIGH (ref 11.5–15.5)
WBC Count: 6 K/uL (ref 4.0–10.5)
nRBC: 0 % (ref 0.0–0.2)

## 2024-06-19 LAB — PHOSPHORUS: Phosphorus: 4.6 mg/dL (ref 2.5–4.6)

## 2024-06-19 NOTE — Telephone Encounter (Signed)
 Called to confirm/remind patient of their appointment at the [facility] on 06/20/24.   Appointment:   [x] Confirmed  [] Left mess   [] No answer/No voice mail  [] VM Full/unable to leave message  [] Phone not in service  Patient reminded to bring all medications and/or complete list.  Confirmed patient has transportation. Gave directions, instructed to utilize valet parking.

## 2024-06-19 NOTE — Telephone Encounter (Signed)
 Called to confirm/remind patient of their appointment at the [facility] on 06/20/24.        Appointment:              [x] Confirmed             [] Left mess              [] No answer/No voice mail             [] Phone not in service   Patient reminded to bring all medications and/or complete list.   Confirmed patient has transportation. Gave directions, instructed to utilize valet parking.

## 2024-06-20 ENCOUNTER — Other Ambulatory Visit: Payer: Self-pay

## 2024-06-20 ENCOUNTER — Encounter (HOSPITAL_COMMUNITY): Payer: Self-pay

## 2024-06-20 ENCOUNTER — Inpatient Hospital Stay

## 2024-06-20 ENCOUNTER — Ambulatory Visit (HOSPITAL_COMMUNITY)
Admission: RE | Admit: 2024-06-20 | Discharge: 2024-06-20 | Disposition: A | Discharge status: Home / Self Care | Source: Ambulatory Visit | Attending: Family Medicine | Admitting: Family Medicine

## 2024-06-20 ENCOUNTER — Inpatient Hospital Stay (HOSPITAL_BASED_OUTPATIENT_CLINIC_OR_DEPARTMENT_OTHER): Admitting: Nurse Practitioner

## 2024-06-20 ENCOUNTER — Encounter: Payer: Self-pay | Admitting: Nurse Practitioner

## 2024-06-20 VITALS — BP 98/62 | HR 93 | Ht 69.0 in | Wt 202.0 lb

## 2024-06-20 VITALS — BP 94/69 | HR 100 | Temp 98.0°F | Resp 18 | Ht 69.0 in | Wt 202.2 lb

## 2024-06-20 DIAGNOSIS — C221 Intrahepatic bile duct carcinoma: Secondary | ICD-10-CM

## 2024-06-20 DIAGNOSIS — Z66 Do not resuscitate: Secondary | ICD-10-CM | POA: Insufficient documentation

## 2024-06-20 DIAGNOSIS — I5082 Biventricular heart failure: Secondary | ICD-10-CM | POA: Diagnosis not present

## 2024-06-20 DIAGNOSIS — Z794 Long term (current) use of insulin: Secondary | ICD-10-CM | POA: Diagnosis not present

## 2024-06-20 DIAGNOSIS — E1122 Type 2 diabetes mellitus with diabetic chronic kidney disease: Secondary | ICD-10-CM | POA: Insufficient documentation

## 2024-06-20 DIAGNOSIS — I2510 Atherosclerotic heart disease of native coronary artery without angina pectoris: Secondary | ICD-10-CM | POA: Diagnosis not present

## 2024-06-20 DIAGNOSIS — E1165 Type 2 diabetes mellitus with hyperglycemia: Secondary | ICD-10-CM | POA: Diagnosis not present

## 2024-06-20 DIAGNOSIS — I25118 Atherosclerotic heart disease of native coronary artery with other forms of angina pectoris: Secondary | ICD-10-CM

## 2024-06-20 DIAGNOSIS — I447 Left bundle-branch block, unspecified: Secondary | ICD-10-CM | POA: Diagnosis not present

## 2024-06-20 DIAGNOSIS — Z87891 Personal history of nicotine dependence: Secondary | ICD-10-CM | POA: Diagnosis not present

## 2024-06-20 DIAGNOSIS — I252 Old myocardial infarction: Secondary | ICD-10-CM | POA: Diagnosis not present

## 2024-06-20 DIAGNOSIS — I38 Endocarditis, valve unspecified: Secondary | ICD-10-CM

## 2024-06-20 DIAGNOSIS — K56699 Other intestinal obstruction unspecified as to partial versus complete obstruction: Secondary | ICD-10-CM | POA: Insufficient documentation

## 2024-06-20 DIAGNOSIS — R160 Hepatomegaly, not elsewhere classified: Secondary | ICD-10-CM | POA: Diagnosis not present

## 2024-06-20 DIAGNOSIS — Z7902 Long term (current) use of antithrombotics/antiplatelets: Secondary | ICD-10-CM | POA: Insufficient documentation

## 2024-06-20 DIAGNOSIS — N1832 Chronic kidney disease, stage 3b: Secondary | ICD-10-CM | POA: Insufficient documentation

## 2024-06-20 DIAGNOSIS — I4729 Other ventricular tachycardia: Secondary | ICD-10-CM

## 2024-06-20 DIAGNOSIS — I255 Ischemic cardiomyopathy: Secondary | ICD-10-CM | POA: Diagnosis not present

## 2024-06-20 DIAGNOSIS — I5022 Chronic systolic (congestive) heart failure: Secondary | ICD-10-CM | POA: Diagnosis not present

## 2024-06-20 DIAGNOSIS — E785 Hyperlipidemia, unspecified: Secondary | ICD-10-CM | POA: Diagnosis not present

## 2024-06-20 DIAGNOSIS — I472 Ventricular tachycardia, unspecified: Secondary | ICD-10-CM | POA: Insufficient documentation

## 2024-06-20 DIAGNOSIS — I130 Hypertensive heart and chronic kidney disease with heart failure and stage 1 through stage 4 chronic kidney disease, or unspecified chronic kidney disease: Secondary | ICD-10-CM | POA: Diagnosis not present

## 2024-06-20 DIAGNOSIS — Z79899 Other long term (current) drug therapy: Secondary | ICD-10-CM | POA: Insufficient documentation

## 2024-06-20 DIAGNOSIS — N189 Chronic kidney disease, unspecified: Secondary | ICD-10-CM | POA: Diagnosis not present

## 2024-06-20 MED ORDER — POTASSIUM CHLORIDE CRYS ER 20 MEQ PO TBCR: 40.0000 meq | EXTENDED_RELEASE_TABLET | Freq: Every day | ORAL | 11 refills | Status: DC

## 2024-06-20 MED ORDER — TORSEMIDE 10 MG PO TABS: 40.0000 mg | ORAL_TABLET | Freq: Two times a day (BID) | ORAL | 11 refills | Status: DC

## 2024-06-20 MED ORDER — PROCHLORPERAZINE MALEATE 5 MG PO TABS: 5.0000 mg | ORAL_TABLET | Freq: Three times a day (TID) | ORAL | 2 refills | Status: DC | PRN

## 2024-06-20 NOTE — Patient Instructions (Addendum)
 Thank you for coming in today  If you had labs drawn today, any labs that are abnormal the clinic will call you No news is good news  Medications: Decrease Torsemide  to 40 mg twice daily  Follow up appointments:  Your physician recommends that you schedule a follow-up appointment in:  8 weeks in With Dr. Cherrie  Please call our office to schedule the follow-up appointment in September for October 2025 .    Do the following things EVERYDAY: Weigh yourself in the morning before breakfast. Write it down and keep it in a log. Take your medicines as prescribed Eat low salt foods--Limit salt (sodium) to 2000 mg per day.  Stay as active as you can everyday Limit all fluids for the day to less than 2 liters   At the [facility], you and your health needs are our priority. As part of our continuing mission to provide you with exceptional heart care, we have created designated Provider Care Teams. These Care Teams include your primary Cardiologist (physician) and Advanced Practice Providers (APPs- Physician Assistants and Nurse Practitioners) who all work together to provide you with the care you need, when you need it.   You may see any of the following providers on your designated Care Team at your next follow up: Dr Toribio Cherrie Dr Ezra Shuck Dr. Ria Gardenia Greig Lenetta, NP Caffie Shed, [place] [facility] [place], [place] Beckey Coe, NP Tinnie Redman, PharmD   Please be sure to bring in all your medications bottles to every appointment.    Thank you for choosing [facility]  If you have any questions or concerns before your next appointment please send us  a message through [place] or call our office at [phone].    TO LEAVE A MESSAGE FOR THE NURSE SELECT OPTION 2, PLEASE LEAVE A MESSAGE INCLUDING: YOUR NAME DATE OF BIRTH CALL BACK NUMBER REASON FOR CALL**this is important as we prioritize the  call backs  YOU WILL RECEIVE A CALL BACK THE SAME DAY AS LONG AS YOU CALL BEFORE 4:00 PM

## 2024-06-20 NOTE — Patient Instructions (Addendum)
 Thank you for coming in today  If you had labs drawn today, any labs that are abnormal the clinic will call you No news is good news  Medications: Decrease Torsemide  to 40 mg twice daily  Follow up appointments:  Your physician recommends that you schedule a follow-up appointment in:  8 weeks in With Dr. Cherrie  Please call our office to schedule the follow-up appointment in September for October 2025 .    Do the following things EVERYDAY: Weigh yourself in the morning before breakfast. Write it down and keep it in a log. Take your medicines as prescribed Eat low salt foods--Limit salt (sodium) to 2000 mg per day.  Stay as active as you can everyday Limit all fluids for the day to less than 2 liters   At the [facility], you and your health needs are our priority. As part of our continuing mission to provide you with exceptional heart care, we have created designated Provider Care Teams. These Care Teams include your primary Cardiologist (physician) and Advanced Practice Providers (APPs- Physician Assistants and Nurse Practitioners) who all work together to provide you with the care you need, when you need it.   You may see any of the following providers on your designated Care Team at your next follow up: Dr Toribio Cherrie Dr Ezra Shuck Dr. Ria Gardenia Greig Lenetta, NP Caffie Shed, [place] [facility] [place], [place] Beckey Coe, NP Tinnie Redman, PharmD   Please be sure to bring in all your medications bottles to every appointment.    Thank you for choosing [facility]  If you have any questions or concerns before your next appointment please send us  a message through [place] or call our office at [phone].    TO LEAVE A MESSAGE FOR THE NURSE SELECT OPTION 2, PLEASE LEAVE A MESSAGE INCLUDING: YOUR NAME DATE OF BIRTH CALL BACK NUMBER REASON FOR CALL**this is important as we prioritize the  call backs  YOU WILL RECEIVE A CALL BACK THE SAME DAY AS LONG AS YOU CALL BEFORE 4:00 PM

## 2024-06-20 NOTE — Progress Notes (Signed)
 [facility] OFFICE PROGRESS NOTE   Diagnosis: Cholangiocarcinoma  INTERVAL HISTORY:   Eduardo [name] returns as scheduled.  He continues Pemigatinib .  Phosphorus level returned elevated 06/08/2024, further elevated 06/15/2024.  He was started on sevelamer  06/15/2024.  Phosphorus yesterday was normal.  Main complaint is anorexia/alteration in taste.  He is drinking nutritional supplements.  No diarrhea though recently he has noted when he urinates he has a bowel movement.  He reports bowels moving normally over the past 4 to 5 days.  His wife notes labored breathing.  He denies shortness of breath.  No rash.  He continues to have periodic dry heaves.  No eye symptoms.  No nail/nailbed changes.  Objective:  Vital signs in last 24 hours:  Blood pressure 94/69, pulse 100, temperature 98 F (36.7 C), temperature source Temporal, resp. rate 18, height 5' 9 (1.753 m), weight 202 lb 3.2 oz (91.7 kg), SpO2 97%.    HEENT: Tongue is dry.  No ulcers.  No thrush. Resp: Distant breath sounds.  No respiratory distress. Cardio: Regular with premature beats. GI: No hepatosplenomegaly. Vascular: Trace edema left lower leg. Neuro: Alert and oriented. Skin: Erythema bilateral upper medial thigh/lower medial buttock.  No skin breakdown.   Lab Results:  Lab Results  Component Value Date   WBC 6.0 06/19/2024   HGB 14.0 06/19/2024   HCT 43.2 06/19/2024   MCV 91.1 06/19/2024   PLT 167 06/19/2024   NEUTROABS 4.9 06/19/2024    Imaging:  No results found.  Medications: I have reviewed the patient's current medications.  Assessment/Plan: Cholangiocarcinoma CT abdomen/pelvis 11/14/2023-closed-loop small bowel obstruction, 8.8 cm hypoattenuating lesion in the posterior right liver with suggestion of nodular peripheral enhancement, small bilateral pleural effusions MRI abdomen 01/27/2024-rim-enhancing centrally hypoattenuating mass in the posterior right liver segment 7 measuring 11 x 8.4 cm,  increased in size compared to the 11/14/2023 CT consistent with a necrotic metastasis or primary liver mass, severe anasarca, small volume ascites, bilateral pleural effusions, mild splenomegaly CT abdomen/pelvis 02/14/2024-no change in complex right liver lesion, small bilateral pleural effusions left greater than right, soft tissue edema of the anterior abdominal wall Biopsy liver lesion 03/06/2024-benign hepatic parenchyma with reactive changes CT chest 03/09/2024-a few scattered small lung nodules measuring up to 4 mm right lower and left upper lobes.  Mild cardiomegaly.  Stable right and moderate left pleural effusions.  Anasarca.  Similar large hypodense mass filling the posterior right hepatic lobe. 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  Phosphorus level normal 06/19/2024.  Disposition: [name] appears unchanged.  He is accompanied by his  significant other.  He has completed 1 cycle of pemigatinib .  He developed hyperphosphatemia, now on sevelamer .  He will begin cycle 2 pemigatinib  06/21/2024.  We reviewed overall plan for restaging CTs  after completing 3 cycles of pemigatinib , sooner if clinical status warrants.  Main complaint is altered taste, anorexia.  These predated the start of pemigatinib .  He would like an appetite stimulant.  We discussed various options including Remeron, prednisone, Megace.  He understands the potential for interactions with his other medications as well as worsening diabetes.  We talked about the increased risk of blood clots with Megace.  He and his significant other feel the risk of continued poor nutrition is greater then the risk of any potential side effects.  The [facility] pharmacist is reviewing his medication list for any interactions with Megace.  We will let him know a final determination.  We are making a referral to the Cancer Center dietitian.  Plan for labs on a weekly basis, next 06/26/2024.  He will return for lab and follow-up on 07/03/2024.  We are available to see him sooner if needed.  Plan reviewed with [name].  [name] ANP/GNP-BC   06/20/2024  1:56 PM

## 2024-06-21 ENCOUNTER — Other Ambulatory Visit: Payer: Self-pay

## 2024-06-21 DIAGNOSIS — R160 Hepatomegaly, not elsewhere classified: Secondary | ICD-10-CM

## 2024-06-21 NOTE — Addendum Note (Signed)
 Encounter addended by: Glena [name] HERO, FNP on: 06/21/2024 1:37 PM  Actions taken: Clinical Note Signed

## 2024-06-22 ENCOUNTER — Telehealth: Payer: Self-pay | Admitting: Cardiology

## 2024-06-22 ENCOUNTER — Telehealth: Payer: Self-pay

## 2024-06-22 NOTE — Telephone Encounter (Signed)
Patient gave verbal understanding and had no questions or concerns.

## 2024-06-22 NOTE — Telephone Encounter (Signed)
 Yes. Okay with me.  Thanks MJP

## 2024-06-22 NOTE — Telephone Encounter (Signed)
 Pt would like a c/b regarding if next appt (10/14) is able to be virtual. Please advise

## 2024-06-22 NOTE — Telephone Encounter (Signed)
 Attempted to contact patient. Left message to call back on personal voicemail.

## 2024-06-22 NOTE — Telephone Encounter (Signed)
 Called and spoke to patient. Patient would like to make office visit on 08/22/2024 virtual if possible because it is hard to get around. States they usually just talk at his appointments and feels it would be easier to do virtually. Will route to Dr. [place] to see if it is okay to change this appointment to a virtual appointment. Notified patient he will be called back.

## 2024-06-22 NOTE — Telephone Encounter (Signed)
-----   Message from [name] sent at 06/21/2024  2:00 PM EDT ----- Please let him know our pharmacy team does not recommend use of Megace due to the blood clot risk and potential for edema/fluid retention.  Cardiology reached out and said they were stopping amiodarone  to see if this would help with his appetite.  Follow-up as scheduled.

## 2024-06-23 NOTE — Telephone Encounter (Signed)
 Attempted to contact patient. Left message to call back on personal voicemail.

## 2024-06-26 ENCOUNTER — Telehealth: Payer: Self-pay | Admitting: *Deleted

## 2024-06-26 ENCOUNTER — Other Ambulatory Visit: Payer: Self-pay

## 2024-06-26 ENCOUNTER — Emergency Department (HOSPITAL_COMMUNITY)

## 2024-06-26 ENCOUNTER — Inpatient Hospital Stay (HOSPITAL_COMMUNITY)
Admission: EM | Admit: 2024-06-26 | Discharge: 2024-07-10 | DRG: 291 | Disposition: E | Attending: Internal Medicine | Admitting: Internal Medicine

## 2024-06-26 ENCOUNTER — Inpatient Hospital Stay

## 2024-06-26 DIAGNOSIS — E1122 Type 2 diabetes mellitus with diabetic chronic kidney disease: Secondary | ICD-10-CM | POA: Diagnosis not present

## 2024-06-26 DIAGNOSIS — R570 Cardiogenic shock: Secondary | ICD-10-CM | POA: Diagnosis not present

## 2024-06-26 DIAGNOSIS — N400 Enlarged prostate without lower urinary tract symptoms: Secondary | ICD-10-CM | POA: Diagnosis present

## 2024-06-26 DIAGNOSIS — R0989 Other specified symptoms and signs involving the circulatory and respiratory systems: Secondary | ICD-10-CM | POA: Diagnosis not present

## 2024-06-26 DIAGNOSIS — Z833 Family history of diabetes mellitus: Secondary | ICD-10-CM

## 2024-06-26 DIAGNOSIS — I509 Heart failure, unspecified: Secondary | ICD-10-CM | POA: Diagnosis not present

## 2024-06-26 DIAGNOSIS — Z95810 Presence of automatic (implantable) cardiac defibrillator: Secondary | ICD-10-CM

## 2024-06-26 DIAGNOSIS — L89151 Pressure ulcer of sacral region, stage 1: Secondary | ICD-10-CM | POA: Diagnosis present

## 2024-06-26 DIAGNOSIS — N179 Acute kidney failure, unspecified: Secondary | ICD-10-CM | POA: Diagnosis present

## 2024-06-26 DIAGNOSIS — I255 Ischemic cardiomyopathy: Secondary | ICD-10-CM | POA: Diagnosis not present

## 2024-06-26 DIAGNOSIS — Z89511 Acquired absence of right leg below knee: Secondary | ICD-10-CM | POA: Diagnosis not present

## 2024-06-26 DIAGNOSIS — C221 Intrahepatic bile duct carcinoma: Secondary | ICD-10-CM | POA: Diagnosis present

## 2024-06-26 DIAGNOSIS — J811 Chronic pulmonary edema: Secondary | ICD-10-CM | POA: Diagnosis not present

## 2024-06-26 DIAGNOSIS — Z1152 Encounter for screening for COVID-19: Secondary | ICD-10-CM | POA: Diagnosis not present

## 2024-06-26 DIAGNOSIS — Z888 Allergy status to other drugs, medicaments and biological substances status: Secondary | ICD-10-CM

## 2024-06-26 DIAGNOSIS — E1140 Type 2 diabetes mellitus with diabetic neuropathy, unspecified: Secondary | ICD-10-CM | POA: Diagnosis present

## 2024-06-26 DIAGNOSIS — E119 Type 2 diabetes mellitus without complications: Secondary | ICD-10-CM

## 2024-06-26 DIAGNOSIS — Z9842 Cataract extraction status, left eye: Secondary | ICD-10-CM

## 2024-06-26 DIAGNOSIS — I110 Hypertensive heart disease with heart failure: Secondary | ICD-10-CM | POA: Diagnosis not present

## 2024-06-26 DIAGNOSIS — R531 Weakness: Secondary | ICD-10-CM | POA: Diagnosis not present

## 2024-06-26 DIAGNOSIS — Z91040 Latex allergy status: Secondary | ICD-10-CM

## 2024-06-26 DIAGNOSIS — I5023 Acute on chronic systolic (congestive) heart failure: Secondary | ICD-10-CM | POA: Diagnosis present

## 2024-06-26 DIAGNOSIS — I252 Old myocardial infarction: Secondary | ICD-10-CM

## 2024-06-26 DIAGNOSIS — E11319 Type 2 diabetes mellitus with unspecified diabetic retinopathy without macular edema: Secondary | ICD-10-CM | POA: Diagnosis not present

## 2024-06-26 DIAGNOSIS — I130 Hypertensive heart and chronic kidney disease with heart failure and stage 1 through stage 4 chronic kidney disease, or unspecified chronic kidney disease: Secondary | ICD-10-CM | POA: Diagnosis not present

## 2024-06-26 DIAGNOSIS — J9600 Acute respiratory failure, unspecified whether with hypoxia or hypercapnia: Secondary | ICD-10-CM | POA: Diagnosis not present

## 2024-06-26 DIAGNOSIS — I2510 Atherosclerotic heart disease of native coronary artery without angina pectoris: Secondary | ICD-10-CM | POA: Diagnosis present

## 2024-06-26 DIAGNOSIS — Z8249 Family history of ischemic heart disease and other diseases of the circulatory system: Secondary | ICD-10-CM

## 2024-06-26 DIAGNOSIS — I5084 End stage heart failure: Secondary | ICD-10-CM | POA: Diagnosis present

## 2024-06-26 DIAGNOSIS — R918 Other nonspecific abnormal finding of lung field: Secondary | ICD-10-CM | POA: Diagnosis not present

## 2024-06-26 DIAGNOSIS — I472 Ventricular tachycardia, unspecified: Secondary | ICD-10-CM | POA: Diagnosis not present

## 2024-06-26 DIAGNOSIS — Z515 Encounter for palliative care: Secondary | ICD-10-CM

## 2024-06-26 DIAGNOSIS — W1811XA Fall from or off toilet without subsequent striking against object, initial encounter: Secondary | ICD-10-CM | POA: Diagnosis present

## 2024-06-26 DIAGNOSIS — J9601 Acute respiratory failure with hypoxia: Secondary | ICD-10-CM | POA: Diagnosis not present

## 2024-06-26 DIAGNOSIS — Z79899 Other long term (current) drug therapy: Secondary | ICD-10-CM

## 2024-06-26 DIAGNOSIS — I447 Left bundle-branch block, unspecified: Secondary | ICD-10-CM | POA: Diagnosis present

## 2024-06-26 DIAGNOSIS — J90 Pleural effusion, not elsewhere classified: Secondary | ICD-10-CM | POA: Diagnosis not present

## 2024-06-26 DIAGNOSIS — Z9841 Cataract extraction status, right eye: Secondary | ICD-10-CM

## 2024-06-26 DIAGNOSIS — Z91013 Allergy to seafood: Secondary | ICD-10-CM

## 2024-06-26 DIAGNOSIS — R059 Cough, unspecified: Secondary | ICD-10-CM | POA: Diagnosis not present

## 2024-06-26 DIAGNOSIS — Z66 Do not resuscitate: Secondary | ICD-10-CM | POA: Diagnosis not present

## 2024-06-26 DIAGNOSIS — R9431 Abnormal electrocardiogram [ECG] [EKG]: Secondary | ICD-10-CM

## 2024-06-26 DIAGNOSIS — I9589 Other hypotension: Secondary | ICD-10-CM | POA: Diagnosis present

## 2024-06-26 DIAGNOSIS — I2489 Other forms of acute ischemic heart disease: Secondary | ICD-10-CM | POA: Diagnosis not present

## 2024-06-26 DIAGNOSIS — Z91048 Other nonmedicinal substance allergy status: Secondary | ICD-10-CM

## 2024-06-26 DIAGNOSIS — L8990 Pressure ulcer of unspecified site, unspecified stage: Secondary | ICD-10-CM | POA: Insufficient documentation

## 2024-06-26 DIAGNOSIS — Z83438 Family history of other disorder of lipoprotein metabolism and other lipidemia: Secondary | ICD-10-CM

## 2024-06-26 DIAGNOSIS — Z860100 Personal history of colon polyps, unspecified: Secondary | ICD-10-CM

## 2024-06-26 DIAGNOSIS — Z794 Long term (current) use of insulin: Secondary | ICD-10-CM | POA: Diagnosis not present

## 2024-06-26 DIAGNOSIS — E781 Pure hyperglyceridemia: Secondary | ICD-10-CM | POA: Diagnosis present

## 2024-06-26 DIAGNOSIS — R270 Ataxia, unspecified: Secondary | ICD-10-CM | POA: Diagnosis not present

## 2024-06-26 DIAGNOSIS — T502X5A Adverse effect of carbonic-anhydrase inhibitors, benzothiadiazides and other diuretics, initial encounter: Secondary | ICD-10-CM | POA: Diagnosis present

## 2024-06-26 DIAGNOSIS — R0902 Hypoxemia: Secondary | ICD-10-CM

## 2024-06-26 DIAGNOSIS — N1831 Chronic kidney disease, stage 3a: Secondary | ICD-10-CM | POA: Diagnosis present

## 2024-06-26 DIAGNOSIS — Z7902 Long term (current) use of antithrombotics/antiplatelets: Secondary | ICD-10-CM

## 2024-06-26 DIAGNOSIS — Z87891 Personal history of nicotine dependence: Secondary | ICD-10-CM

## 2024-06-26 DIAGNOSIS — I5082 Biventricular heart failure: Secondary | ICD-10-CM | POA: Diagnosis not present

## 2024-06-26 LAB — URINALYSIS, ROUTINE W REFLEX MICROSCOPIC
Bilirubin Urine: NEGATIVE
Glucose, UA: NEGATIVE mg/dL
Hgb urine dipstick: NEGATIVE
Leukocytes,Ua: NEGATIVE
Nitrite: NEGATIVE
Specific Gravity, Urine: 1.01 (ref 1.005–1.030)
pH: 5.5 (ref 5.0–8.0)

## 2024-06-26 LAB — COMPREHENSIVE METABOLIC PANEL WITH GFR
ALT: 47 U/L — ABNORMAL HIGH (ref 0–44)
AST: 45 U/L — ABNORMAL HIGH (ref 15–41)
Albumin: 2.7 g/dL — ABNORMAL LOW (ref 3.5–5.0)
Alkaline Phosphatase: 97 U/L (ref 38–126)
Anion gap: 12 (ref 5–15)
BUN: 67 mg/dL — ABNORMAL HIGH (ref 8–23)
CO2: 25 mmol/L (ref 22–32)
Calcium: 9.2 mg/dL (ref 8.9–10.3)
Chloride: 99 mmol/L (ref 98–111)
Creatinine, Ser: 2.2 mg/dL — ABNORMAL HIGH (ref 0.61–1.24)
GFR, Estimated: 32 mL/min — ABNORMAL LOW (ref >60)
Glucose, Bld: 236 mg/dL — ABNORMAL HIGH (ref 70–99)
Potassium: 4.3 mmol/L (ref 3.5–5.1)
Sodium: 136 mmol/L (ref 135–145)
Total Bilirubin: 1.2 mg/dL (ref 0.0–1.2)
Total Protein: 6.2 g/dL — ABNORMAL LOW (ref 6.5–8.1)

## 2024-06-26 LAB — RESP PANEL BY RT-PCR (RSV, FLU A&B, COVID)  RVPGX2
Influenza A by PCR: NEGATIVE
Influenza B by PCR: NEGATIVE
Resp Syncytial Virus by PCR: NEGATIVE
SARS Coronavirus 2 by RT PCR: NEGATIVE

## 2024-06-26 LAB — CBC
HCT: 41.7 % (ref 39.0–52.0)
Hemoglobin: 13.1 g/dL (ref 13.0–17.0)
MCH: 28.9 pg (ref 26.0–34.0)
MCHC: 31.4 g/dL (ref 30.0–36.0)
MCV: 92.1 fL (ref 80.0–100.0)
Platelets: 207 K/uL (ref 150–400)
RBC: 4.53 MIL/uL (ref 4.22–5.81)
RDW: 18.4 % — ABNORMAL HIGH (ref 11.5–15.5)
WBC: 6.5 K/uL (ref 4.0–10.5)
nRBC: 0 % (ref 0.0–0.2)

## 2024-06-26 LAB — TROPONIN I (HIGH SENSITIVITY)
Troponin I (High Sensitivity): 70 ng/L — ABNORMAL HIGH (ref <18)
Troponin I (High Sensitivity): 65 ng/L — ABNORMAL HIGH (ref <18)

## 2024-06-26 LAB — CBG MONITORING, ED: Glucose-Capillary: 225 mg/dL — ABNORMAL HIGH (ref 70–99)

## 2024-06-26 LAB — MAGNESIUM: Magnesium: 2.7 mg/dL — ABNORMAL HIGH (ref 1.7–2.4)

## 2024-06-26 LAB — BRAIN NATRIURETIC PEPTIDE: B Natriuretic Peptide: >4500 pg/mL — ABNORMAL HIGH (ref 0.0–100.0)

## 2024-06-26 MED ORDER — FUROSEMIDE 10 MG/ML IJ SOLN
20.0000 mg | Freq: Once | INTRAMUSCULAR | Status: AC
  Administered 2024-06-26: 20 mg via INTRAVENOUS
  Filled 2024-06-26: qty 4

## 2024-06-26 MED ORDER — ENOXAPARIN SODIUM 40 MG/0.4ML IJ SOSY
40.0000 mg | PREFILLED_SYRINGE | INTRAMUSCULAR | Status: DC
  Administered 2024-06-27: 40 mg via SUBCUTANEOUS
  Filled 2024-06-26: qty 0.4

## 2024-06-26 MED ORDER — FUROSEMIDE 10 MG/ML IJ SOLN: 40.0000 mg | Freq: Once | INTRAMUSCULAR | Status: DC

## 2024-06-26 MED ORDER — EZETIMIBE 10 MG PO TABS
10.0000 mg | ORAL_TABLET | Freq: Every day | ORAL | Status: DC
  Administered 2024-06-27: 10 mg via ORAL
  Filled 2024-06-26: qty 1

## 2024-06-26 MED ORDER — CLOPIDOGREL BISULFATE 75 MG PO TABS
75.0000 mg | ORAL_TABLET | Freq: Every day | ORAL | Status: DC
  Administered 2024-06-27: 75 mg via ORAL
  Filled 2024-06-26: qty 1

## 2024-06-26 MED ORDER — TAMSULOSIN HCL 0.4 MG PO CAPS
0.4000 mg | ORAL_CAPSULE | Freq: Every day | ORAL | Status: DC
  Administered 2024-06-27: 0.4 mg via ORAL
  Filled 2024-06-26: qty 1

## 2024-06-26 MED ORDER — INSULIN NPH (HUMAN) (ISOPHANE) 100 UNIT/ML ~~LOC~~ SUSP
6.0000 [IU] | Freq: Every day | SUBCUTANEOUS | Status: DC
  Administered 2024-06-27: 6 [IU] via SUBCUTANEOUS
  Filled 2024-06-26: qty 10

## 2024-06-26 MED ORDER — INSULIN ASPART 100 UNIT/ML IJ SOLN
0.0000 [IU] | Freq: Three times a day (TID) | INTRAMUSCULAR | Status: DC
  Administered 2024-06-27 (×2): 3 [IU] via SUBCUTANEOUS
  Filled 2024-06-26: qty 0.09

## 2024-06-26 MED ORDER — ROSUVASTATIN CALCIUM 20 MG PO TABS
20.0000 mg | ORAL_TABLET | Freq: Every day | ORAL | Status: DC
  Administered 2024-06-27: 20 mg via ORAL
  Filled 2024-06-26: qty 1

## 2024-06-26 MED ORDER — MIDODRINE HCL 5 MG PO TABS
15.0000 mg | ORAL_TABLET | Freq: Three times a day (TID) | ORAL | Status: DC
  Administered 2024-06-27 (×3): 15 mg via ORAL
  Filled 2024-06-26 (×3): qty 3

## 2024-06-26 MED ORDER — SEVELAMER CARBONATE 800 MG PO TABS
1600.0000 mg | ORAL_TABLET | Freq: Three times a day (TID) | ORAL | Status: DC
  Administered 2024-06-27: 1600 mg via ORAL
  Filled 2024-06-26 (×5): qty 2

## 2024-06-26 MED ORDER — INSULIN ASPART 100 UNIT/ML IJ SOLN
0.0000 [IU] | Freq: Every day | INTRAMUSCULAR | Status: DC
  Administered 2024-06-27: 2 [IU] via SUBCUTANEOUS
  Filled 2024-06-26: qty 0.05

## 2024-06-26 MED ORDER — GEMFIBROZIL 600 MG PO TABS
600.0000 mg | ORAL_TABLET | Freq: Two times a day (BID) | ORAL | Status: DC
  Administered 2024-06-27: 600 mg via ORAL
  Filled 2024-06-26 (×3): qty 1

## 2024-06-26 NOTE — H&P (Signed)
 History and Physical    Eduardo [name] FMW:[id number] DOB: [birth year] DOA: 06/26/2024  PCP: [name], MD  Patient coming from: Home  Chief Complaint: Generalized weakness  HPI: Eduardo [name] is a 68 y.o. 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  Patient was admitted 6/25-05/09/2024 for septic shock secondary to GBS bacteremia and was treated with ceftriaxone  through 06/02/2024 for presumed endocarditis.   Patient presents to the ED today with a chief complaint of generalized weakness.  States about 2 or 3 days ago he had a fall at home while trying to get up from the toilet and paramedics came but he had decided not to come into the hospital at that time.  He has continued to feel very weak since then and today he did not have the strength to transfer from his wheelchair to his bed.  Denies head injury or loss of consciousness.  He is reporting dry cough and mild shortness of breath/orthopnea.  Also reporting lower extremity edema.  He is compliant with his home medications including torsemide  40 mg twice daily.  He drinks about 64 ounces of fluids in a day and denies increased dietary sodium intake.  Denies fevers, chills, lightheadedness/dizziness, chest pain, nausea, vomiting, or abdominal pain.  He reports having diarrhea a few weeks ago which has resolved.  Denies any urinary symptoms.  ED Course: Oxygen saturation 88-89% on room air and placed on 1 L supplemental oxygen.  SBP in the 90s.  No fever or tachycardia.  Labs showing no leukocytosis or anemia, glucose 236, bicarb 25, BUN 67, creatinine 2.2 (baseline  1.5-1.7), albumin  2.7, transaminases borderline elevated and improved compared to labs a week ago, alk phos and T. bili normal, BNP >4500, troponin 70> 65, COVID/influenza/RSV PCR negative, UA not suggestive of infection.  Chest x-ray showing cardiomegaly with central pulmonary vascular congestion and stable small left pleural effusion with minimal left basilar opacities.  CT head negative for acute findings.  EKG showing sinus rhythm, PVCs, QTc 532, and no STEMI.  Patient was given IV Lasix  20 mg.   Review of Systems:  Review of Systems  All other systems reviewed and are negative.   Past Medical History:  Diagnosis Date   CHF (congestive heart failure) (HCC)    Chronic kidney disease    stage 3   Colon polyp    Coronary artery disease    Diabetes mellitus 1987   under care of Dr. Tommas.  On insulin  since 96 (off and on)   Diabetic retinopathy    Dupuytren contracture    R hand, s/p injection (Dr. Lorretta)   Essential hypertension, benign    Essential hypertension, benign 02/06/2019   Frequency of urination and polyuria    Hypertension    Myocardial infarction [facility])    denies   Neuromuscular disorder (HCC)    Diabetic neuropathy   Osteomyelitis (HCC)    right foot   Other testicular hypofunction    Peripheral arterial disease (HCC) 10/28/2012   Peritoneal abscess (HCC) 6/08   and buttock.   Pneumonia    Polydipsia    Proteinuria    Pure hyperglyceridemia    Subacute osteomyelitis, right ankle and foot (HCC)    Wears  glasses     Past Surgical History:  Procedure Laterality Date   ABDOMINAL AORTAGRAM N/A 04/18/2012   Procedure: ABDOMINAL EZELLA;  Surgeon: Lonni GORMAN Blade, MD;  Location: [facility] CATH LAB;  Service: Cardiovascular;  Laterality: N/A;   AMPUTATION Right 05/19/2019   Procedure: RIGHT FOOT 5TH RAY AMPUTATION;  Surgeon: Harden Jerona GAILS, MD;  Location: [facility] OR;  Service: Orthopedics;  Laterality: Right;   AMPUTATION Right 03/11/2022   Procedure: RIGHT LEG  DEBRIDEMENT VS. BELOW KNEE AMPUTATION;  Surgeon: Harden Jerona GAILS, MD;  Location: [facility] OR;  Service: Orthopedics;  Laterality: Right;   BIV ICD INSERTION CRT-D N/A 10/21/2022   Procedure: BIV ICD INSERTION CRT-D;  Surgeon: Waddell Danelle ORN, MD;  Location: [facility] INVASIVE CV LAB;  Service: Cardiovascular;  Laterality: N/A;   BOWEL RESECTION N/A 11/14/2023   Procedure: SMALL BOWEL RESECTION;  Surgeon: Stevie, Herlene Righter, MD;  Location: [facility] OR;  Service: General;  Laterality: N/A;   CARDIAC CATHETERIZATION N/A 09/08/2016   Procedure: Left Heart Cath and Coronary Angiography;  Surgeon: Gordy Bergamo, MD;  Location: [facility] INVASIVE CV LAB;  Service: Cardiovascular;  Laterality: N/A;   CATARACT EXTRACTION, BILATERAL  09/2017, 10/2017   Dr. Cleatus   COLONOSCOPY W/ BIOPSIES AND POLYPECTOMY     CORONARY/GRAFT ACUTE MI REVASCULARIZATION N/A 10/11/2020   Procedure: Coronary/Graft Acute MI Revascularization;  Surgeon: Bergamo Gordy, MD;  Location: [facility] INVASIVE CV LAB;  Service: Cardiovascular;  Laterality: N/A;   I & D EXTREMITY Right 03/11/2022   Procedure: BELOW KNEE AMPUTATION;  Surgeon: Harden Jerona GAILS, MD;  Location: [facility] OR;  Service: Orthopedics;  Laterality: Right;   INSERT / REPLACE / REMOVE PACEMAKER     LAPAROSCOPY N/A 11/14/2023   Procedure: LAPAROSCOPY DIAGNOSTIC;  Surgeon: Stevie, Herlene Righter, MD;  Location: [facility] OR;  Service: General;  Laterality: N/A;  Converted to open ex-lap after inserting first trocar   LAPAROTOMY N/A 11/14/2023   Procedure: EXPLORATORY LAPAROTOMY;  Surgeon: Stevie Herlene Righter, MD;  Location: [facility] OR;  Service: General;  Laterality: N/A;   LEFT HEART CATH N/A 12/31/2020   Procedure: Left Heart Cath;  Surgeon: Bergamo Gordy, MD;  Location: [facility] INVASIVE CV LAB;  Service: Cardiovascular;  Laterality: N/A;   LEFT HEART CATH AND CORONARY ANGIOGRAPHY N/A 10/11/2020   Procedure: LEFT HEART CATH AND CORONARY ANGIOGRAPHY;  Surgeon: Bergamo Gordy, MD;  Location: [facility] INVASIVE CV LAB;  Service: Cardiovascular;   Laterality: N/A;   LOWER EXTREMITY ANGIOGRAM Bilateral 04/18/2012   Procedure: LOWER EXTREMITY ANGIOGRAM;  Surgeon: Lonni GORMAN Blade, MD;  Location: [facility] CATH LAB;  Service: Cardiovascular;  Laterality: Bilateral;  bilat lower extrem angio   LOWER EXTREMITY ANGIOGRAPHY Bilateral 05/02/2019   Procedure: LOWER EXTREMITY ANGIOGRAPHY;  Surgeon: Bergamo Gordy, MD;  Location: [facility] INVASIVE CV LAB;  Service: Cardiovascular;  Laterality: Bilateral;   LOWER EXTREMITY ANGIOGRAPHY Bilateral 02/28/2019   Procedure: LOWER EXTREMITY ANGIOGRAPHY;  Surgeon: Bergamo Gordy, MD;  Location: [facility] INVASIVE CV LAB;  Service: Cardiovascular;  Laterality: Bilateral;   macular photocoagulation     (eye treatments for diabetic retinopathy)-[name]   PERIPHERAL VASCULAR INTERVENTION  02/28/2019   Procedure: PERIPHERAL VASCULAR INTERVENTION;  Surgeon: Bergamo Gordy, MD;  Location: [facility] INVASIVE CV LAB;  Service: Cardiovascular;;   RIGHT HEART CATH N/A 03/29/2024   Procedure: RIGHT HEART CATH;  Surgeon: Zenaida Morene PARAS, MD;  Location: [facility] INVASIVE CV LAB;  Service: Cardiovascular;  Laterality: N/A;   TEE WITHOUT CARDIOVERSION N/A 03/18/2022   Procedure: TRANSESOPHAGEAL ECHOCARDIOGRAM (TEE);  Surgeon: Bergamo Gordy, MD;  Location: [facility] ENDOSCOPY;  Service: Cardiovascular;  Laterality: N/A;   VENTRICULAR ASSIST DEVICE INSERTION N/A 12/31/2020   Procedure: VENTRICULAR ASSIST DEVICE INSERTION;  Surgeon: Ladona Heinz, MD;  Location: [facility] INVASIVE CV LAB;  Service: Cardiovascular;  Laterality: N/A;     reports that he quit smoking about 12 years ago. His smoking use included cigarettes. He started smoking about 42 years ago. He has a 30 pack-year smoking history. He has never used smokeless tobacco. He reports that he does not currently use alcohol . He reports that he does not use drugs.  Allergies  Allergen Reactions   Empagliflozin      Other Reaction(s): foot infection   Shellfish-Derived Products Nausea And Vomiting and Other (See  Comments)    Only Mussels cause severe nausea and vomiting    Latex Rash   Tape Rash and Other (See Comments)    Caused issues with the skin   Testosterone  Rash    did not feel well     Family History  Problem Relation Age of Onset   Diabetes Mother    Hearing loss Mother    Hypertension Mother    Hyperlipidemia Mother    Heart disease Mother    Varicose Veins Mother    Varicose Veins Father    Dementia Father    Hyperlipidemia Brother    Diabetes Maternal Grandmother     Prior to Admission medications   Medication Sig Start Date End Date Taking? Authorizing Provider  amiodarone  (PACERONE ) 200 MG tablet Take 1 tablet (200 mg total) by mouth daily. 06/08/24   Bensimhon, Daniel R, MD  Bioflavonoid Products (ESTER C PO) Take 1,000 mg by mouth daily.    [provider]  Cholecalciferol  (VITAMIN D ) 2000 units CAPS Take 2,000 Units by mouth daily.    [provider]  clopidogrel  (PLAVIX ) 75 MG tablet TAKE 1 TABLET BY MOUTH DAILY 04/21/24   Patwardhan, Newman PARAS, MD  Continuous Glucose Sensor (FREESTYLE LIBRE 2 PLUS SENSOR) MISC 1 Application. 03/20/24   [provider]  ezetimibe  (ZETIA ) 10 MG tablet TAKE 1 TABLET BY MOUTH DAILY 04/21/24   Patwardhan, Newman PARAS, MD  famotidine  (PEPCID ) 10 MG tablet Take 10 mg by mouth 2 (two) times daily as needed for heartburn or indigestion.    [provider]  gemfibrozil  (LOPID ) 600 MG tablet Take 600 mg by mouth 2 (two) times daily before a meal.    [provider]  HUMALOG KWIKPEN 100 UNIT/ML KiwkPen Inject 3-10 Units into the skin 3 (three) times daily. Inject 01-1009 units into the skin three times a day, per sliding scale- based on BGL >100 08/02/16   [provider]  HUMULIN  N KWIKPEN 100 UNIT/ML KwikPen Inject 6 Units into the skin daily. If BS is under 150 do not take per provider 45 until 15 mg am lunch 15 mg and night 15mg  03/20/24   [provider]  Anselm Oil 300 MG CAPS Take 500 mg  by mouth daily.    [provider]  lidocaine  (XYLOCAINE ) 5 % ointment Apply 1 Application topically as needed. For pain as needed- can use up to 4x/day 01/03/24   Lovorn, Megan, MD  melatonin 10 MG TABS Take 10 mg by mouth at bedtime. 03/31/22   Setzer, Sandra J, PA-C  midodrine  (PROAMATINE ) 5 MG tablet Take 3 tablets (15 mg total) by mouth 3 (three) times daily with meals. 05/23/24   Bensimhon, Toribio SAUNDERS, MD  Multiple Vitamin (MULTIVITAMIN WITH MINERALS) TABS tablet Take 1 tablet  by mouth daily.    [provider]  nitroGLYCERIN  (NITROSTAT ) 0.4 MG SL tablet Place 1 tablet (0.4 mg total) under the tongue every 5 (five) minutes as needed for chest pain. Patient not taking: Reported on 06/20/2024 05/08/21   Cantwell, Sherran C, PA-C  pemigatinib  (PEMAZYRE ) 9 MG tablet Take 1 tablet (9 mg total) by mouth daily. Take 1 tablet daily x 14 days on, then 7 day rest. Start on 06/15/24 06/01/24   Cloretta Arley NOVAK, MD  potassium chloride  SA (KLOR-CON  M) 20 MEQ tablet Take 2 tablets (40 mEq total) by mouth daily. 06/20/24   Glena [name] HERO, FNP  Probiotic Product (PROBIOTIC-10 PO) Take 1 capsule by mouth daily.    [provider]  prochlorperazine  (COMPAZINE ) 5 MG tablet Take 1 tablet (5 mg total) by mouth every 8 (eight) hours as needed for nausea or vomiting. 06/20/24   Debby Olam POUR, NP  rosuvastatin  (CRESTOR ) 20 MG tablet Take 1 tablet (20 mg total) by mouth daily. 07/22/20   Ladona Heinz, MD  sevelamer  carbonate (RENVELA ) 800 MG tablet Take 2 tablets (1,600 mg total) by mouth 3 (three) times daily with meals. 06/15/24   Thomas, Lisa K, NP  sodium chloride  (OCEAN) 0.65 % SOLN nasal spray Place 1 spray into both nostrils as needed for congestion.    [provider]  tamsulosin  (FLOMAX ) 0.4 MG CAPS capsule Take 1 capsule (0.4 mg total) by mouth daily after supper. 04/04/24   Clegg, Amy D, NP  torsemide  (DEMADEX ) 10 MG tablet Take 4 tablets (40 mg total) by mouth 2 (two) times daily.  06/20/24 06/15/25  Glena [name] HERO, FNP    Physical Exam: Vitals:   06/26/24 1900 06/26/24 1915 06/26/24 2030 06/26/24 2153  BP:  95/71 92/74   Pulse:  84 84   Resp:  10 (!) 28   Temp:    98.3 F (36.8 C)  TempSrc:    Oral  SpO2: (!) 89% 92% 94%   Weight:      Height:        Physical Exam Vitals reviewed.  Constitutional:      General: He is not in acute distress. HENT:     Head: Normocephalic and atraumatic.  Eyes:     Extraocular Movements: Extraocular movements intact.  Cardiovascular:     Rate and Rhythm: Normal rate and regular rhythm.     Pulses: Normal pulses.  Pulmonary:     Effort: Pulmonary effort is normal. No respiratory distress.     Breath sounds: No wheezing or rales.  Abdominal:     General: Bowel sounds are normal. There is no distension.     Palpations: Abdomen is soft.     Tenderness: There is no abdominal tenderness. There is no guarding.  Musculoskeletal:     Cervical back: Normal range of motion.     Comments: Right BKA 2+ pitting edema of left lower extremity  Skin:    General: Skin is warm and dry.  Neurological:     General: No focal deficit present.     Mental Status: He is alert and oriented to person, place, and time.     Labs on Admission: I have personally reviewed following labs and imaging studies  CBC: Recent Labs  Lab 06/26/24 1634  WBC 6.5  HGB 13.1  HCT 41.7  MCV 92.1  PLT 207   Basic Metabolic Panel: Recent Labs  Lab 06/26/24 1634  NA 136  K 4.3  CL 99  CO2 25  GLUCOSE 236*  BUN 67*  CREATININE 2.20*  CALCIUM  9.2   GFR: Estimated Creatinine Clearance: 36 mL/min (A) (by C-G formula based on SCr of 2.2 mg/dL (H)). Liver Function Tests: Recent Labs  Lab 06/26/24 1634  AST 45*  ALT 47*  ALKPHOS 97  BILITOT 1.2  PROT 6.2*  ALBUMIN  2.7*   No results for input(s): LIPASE, AMYLASE in the last 168 hours. No results for input(s): AMMONIA in the last 168 hours. Coagulation Profile: No results for  input(s): INR, PROTIME in the last 168 hours. Cardiac Enzymes: No results for input(s): CKTOTAL, CKMB, CKMBINDEX, TROPONINI in the last 168 hours. BNP (last 3 results) No results for input(s): PROBNP in the last 8760 hours. HbA1C: No results for input(s): HGBA1C in the last 72 hours. CBG: Recent Labs  Lab 06/26/24 1919  GLUCAP 225*   Lipid Profile: No results for input(s): CHOL, HDL, LDLCALC, TRIG, CHOLHDL, LDLDIRECT in the last 72 hours. Thyroid  Function Tests: No results for input(s): TSH, T4TOTAL, FREET4, T3FREE, THYROIDAB in the last 72 hours. Anemia Panel: No results for input(s): VITAMINB12, FOLATE, FERRITIN, TIBC, IRON , RETICCTPCT in the last 72 hours. Urine analysis:    Component Value Date/Time   COLORURINE YELLOW 06/26/2024 1837   APPEARANCEUR CLEAR 06/26/2024 1837   LABSPEC 1.010 06/26/2024 1837   PHURINE 5.5 06/26/2024 1837   GLUCOSEU NEGATIVE 06/26/2024 1837   HGBUR NEGATIVE 06/26/2024 1837   BILIRUBINUR NEGATIVE 06/26/2024 1837   BILIRUBINUR negative 06/26/2021 1300   BILIRUBINUR NEG 10/13/2012 0925   KETONESUR TRACE (A) 06/26/2024 1837   PROTEINUR TRACE (A) 06/26/2024 1837   UROBILINOGEN 0.2 06/26/2021 1300   NITRITE NEGATIVE 06/26/2024 1837   LEUKOCYTESUR NEGATIVE 06/26/2024 1837    Radiological Exams on Admission: CT Head Wo Contrast Result Date: 06/26/2024 CLINICAL DATA:  Weakness for 3 days, history of liver cancer, ataxia EXAM: CT HEAD WITHOUT CONTRAST TECHNIQUE: Contiguous axial images were obtained from the base of the skull through the vertex without intravenous contrast. RADIATION DOSE REDUCTION: This exam was performed according to the departmental dose-optimization program which includes automated exposure control, adjustment of the mA and/or kV according to patient size and/or use of iterative reconstruction technique. COMPARISON:  None Available. FINDINGS: Brain: No acute infarct or hemorrhage.  Lateral ventricles and midline structures are unremarkable. No acute extra-axial fluid collections. No mass effect. Vascular: No hyperdense vessel or unexpected calcification. Skull: Normal. Negative for fracture or focal lesion. Sinuses/Orbits: No acute finding. 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 Other: None. IMPRESSION: 1. No acute intracranial process. Electronically Signed   By: Ozell Daring M.D.   On: 06/26/2024 17:38   DG Chest 1 View Result Date: 06/26/2024 CLINICAL DATA:  Cough EXAM: CHEST  1 VIEW COMPARISON:  Chest x-ray 05/04/2024 FINDINGS: Left-sided ICD is present. The heart is enlarged, unchanged. There central pulmonary vascular congestion. There is a small left pleural effusion with minimal left basilar opacities, unchanged from prior. No pneumothorax or acute fracture. IMPRESSION: 1. Cardiomegaly with central pulmonary vascular congestion. 2. Stable small left pleural effusion with minimal left basilar opacities. Electronically Signed   By: Greig Pique M.D.   On: 06/26/2024 17:07    Assessment and Plan  Acute on chronic HFrEF/biventricular heart failure Mild hypoxemia He has an ICD.  Last echo done 05/05/2024 showing EF 20 to 25%, RV systolic function severely reduced, mildly elevated pulmonary artery systolic pressure, and moderate mitral regurgitation.  Oxygen saturation 88-89% on room air, currently stable on 1 L [place].  He has  lower extremity edema on exam.  BNP >4500.  Chest x-ray showing pulmonary vascular congestion and stable small left pleural effusion.  Patient reports compliance with his home meds/diuretic.  SBP in the 90s.  Patient was given IV Lasix  20 mg x 1 in the ED.  He is on midodrine  for chronic hypotension.  Continue further diuresis in the morning as blood pressure is able to tolerate.  Monitor intake and output, daily weights, low-sodium diet with fluid restriction.  Consult cardiology in the morning.  AKI on CKD stage IIIa Likely cardiorenal in  etiology given acutely decompensated CHF.  Creatinine 2.2 (baseline 1.5-1.7).  IV Lasix  given for diuresis as above.  Continue to monitor renal function closely and avoid nephrotoxic agents.  Generalized weakness PT/OT eval, fall precautions.  QT prolongation Monitor potassium and magnesium  levels, replace electrolytes as needed.  Per pharmacy med rec, patient is no longer on amiodarone .  Follow-up repeat EKG in the morning.  Type 2 diabetes Most recent CBG 225.  A1c 6.7 on 03/24/2024.  Continue home basal insulin  and placed on sensitive sliding scale insulin  ACHS.  Multivessel CAD Mild troponin elevation likely due to demand ischemia in the setting of acute CHF, pattern not consistent with ACS.  Patient is not endorsing chest pain.  Continue Plavix , Zetia , gemfibrozil , and Crestor .  Chronic hypotension Continue midodrine .  Cholangiocarcinoma Currently being treated with pemigatinib .  Outpatient oncology follow-up.  History of NSVT Per pharmacy med rec, patient is no longer on amiodarone .  Will hold off starting this medication at this time given QT prolongation.  Consult cardiology in the morning as above.  PAD Hyperlipidemia Continue home meds.  BPH Continue Flomax .  DVT prophylaxis: Lovenox  Code Status: DNR pre-arrest interventions desired (discussed with the patient) Family Communication: No family available at this time.  Level of care: Progressive Care Unit Admission status: It is my clinical opinion that admission to INPATIENT is reasonable and necessary because of the expectation that this patient will require hospital care that crosses at least 2 midnights to treat this condition based on the medical complexity of the problems presented.  Given the aforementioned information, the predictability of an adverse outcome is felt to be significant.  Editha Ram MD [facility]  If 7PM-7AM, please contact night-coverage [email or web address]  06/26/2024, 10:08 PM

## 2024-06-26 NOTE — Telephone Encounter (Signed)
 Spoke with pt, aware appointment has been changed to my chart visit.

## 2024-06-26 NOTE — ED Triage Notes (Addendum)
 Pt BIBA from home for increasing weakness.  Pt was unable to move himself to wheelchair.  Pt reports increasing weakness x3 days.  Receiving treatment for liver cancer. Pt denies nausea, vomiting, diarrhea or urinary symptoms.   BP 100/60 - pt reports this is normal for him.

## 2024-06-26 NOTE — ED Provider Notes (Signed)
 [place] EMERGENCY DEPARTMENT AT [facility] Provider Note  CSN: [id number] Arrival date & time: 06/26/24 1609  Chief Complaint(s) Weakness  HPI Eduardo [name] is a 68 y.o. male with a history of liver cancer, currently undergoing chemotherapy who is here today for generalized weakness.  Patient reports that he fell off of the toilet a few days ago.  He had been feeling weak prior to that, but continues to feel more and more weak and fatigued.  Patient says that he has been staying in bed, has not had the energy to get up and walk.  He said he called EMS 2 times for low energy falls within the home.  He denies any pain in his head, neck, chest, abdomen, back or pelvis.  He also has a history of heart failure.   Past Medical History Past Medical History:  Diagnosis Date   CHF (congestive heart failure) (HCC)    Chronic kidney disease    stage 3   Colon polyp    Coronary artery disease    Diabetes mellitus 1987   under care of Dr. Tommas.  On insulin  since 96 (off and on)   Diabetic retinopathy    Dupuytren contracture    R hand, s/p injection (Dr. Lorretta)   Essential hypertension, benign    Essential hypertension, benign 02/06/2019   Frequency of urination and polyuria    Hypertension    Myocardial infarction [facility])    denies   Neuromuscular disorder (HCC)    Diabetic neuropathy   Osteomyelitis (HCC)    right foot   Other testicular hypofunction    Peripheral arterial disease (HCC) 10/28/2012   Peritoneal abscess (HCC) 6/08   and buttock.   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   HFrEF (heart failure with reduced ejection fraction) (HCC) 03/24/2024   Small bowel obstruction (HCC) 11/14/2023   Small bowel obstruction due to adhesions (HCC) 11/14/2023   Cardiac resynchronization therapy defibrillator (CRT-D) in place 02/04/2023   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   Chronic post-operative pain 09/18/2022   Abnormality of gait 09/18/2022   LBBB (left bundle branch block) 08/19/2022   Chronic systolic heart failure (HCC) 08/19/2022   Chronic right-sided congestive heart failure (HCC) 05/18/2022   Ejection fraction < 50% 04/17/2022   Insomnia    Constipation    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   Ischemic cardiomyopathy    AKI (acute kidney injury) (HCC) 03/13/2022   Bacteremia due to group B Streptococcus 03/13/2022   Hypophosphatemia 03/13/2022   Septic shock (HCC) 03/08/2022   Acute systolic heart failure (HCC) 12/28/2020   Acute pulmonary edema (HCC) 10/11/2020   Non-ST elevation (NSTEMI) myocardial infarction (HCC) 10/11/2020   Acute respiratory distress 10/11/2020   Acute on chronic combined systolic and diastolic CHF (congestive heart failure) (HCC) 10/11/2020   Cardiogenic shock (HCC)    Partial nontraumatic amputation of foot, right (HCC) 06/21/2019   Osteomyelitis of right foot (HCC)    Essential hypertension,  benign 02/06/2019   Hypercholesteremia 02/06/2019   Stage 3b chronic kidney disease (CKD) (HCC) 10/06/2017   Proteinuria 10/06/2017   Controlled type 2 diabetes mellitus with hyperglycemia, with long-term current use of insulin  (HCC) 10/06/2017   Atherosclerosis of native coronary artery of native heart without angina pectoris 05/23/2017   Abnormal nuclear stress test 09/06/2016   PAD (peripheral artery disease) (HCC) 10/28/2012   Morbid obesity with BMI of 40.0-44.9, adult (HCC) 10/13/2012   Diabetes  mellitus with ophthalmic complication (HCC) 10/13/2012   Hypertension associated with diabetes (HCC) 10/13/2012   Atherosclerosis of native artery of extremity with intermittent claudication (HCC) 04/05/2012   Adenomatous colon polyp 09/28/2011   Diabetes mellitus (HCC) 09/28/2011   Pure hyperglyceridemia 09/28/2011   Erectile dysfunction 09/28/2011   Diabetic retinopathy (HCC) 09/28/2011   Home Medication(s) Prior to Admission medications   Medication Sig Start Date End Date Taking? Authorizing Provider  amiodarone  (PACERONE ) 200 MG tablet Take 1 tablet (200 mg total) by mouth daily. 06/08/24   Bensimhon, Daniel R, MD  Bioflavonoid Products (ESTER C PO) Take 1,000 mg by mouth daily.    [provider]  Cholecalciferol  (VITAMIN D ) 2000 units CAPS Take 2,000 Units by mouth daily.    [provider]  clopidogrel  (PLAVIX ) 75 MG tablet TAKE 1 TABLET BY MOUTH DAILY 04/21/24   Patwardhan, Newman PARAS, MD  Continuous Glucose Sensor (FREESTYLE LIBRE 2 PLUS SENSOR) MISC 1 Application. 03/20/24   [provider]  ezetimibe  (ZETIA ) 10 MG tablet TAKE 1 TABLET BY MOUTH DAILY 04/21/24   Patwardhan, Newman PARAS, MD  famotidine  (PEPCID ) 10 MG tablet Take 10 mg by mouth 2 (two) times daily as needed for heartburn or indigestion.    [provider]  gemfibrozil  (LOPID ) 600 MG tablet Take 600 mg by mouth 2 (two) times daily before a meal.    [provider]  HUMALOG KWIKPEN 100 UNIT/ML KiwkPen Inject 3-10 Units into the skin 3 (three) times daily. Inject 01-1009 units into the skin three times a day, per sliding scale- based on BGL >100 08/02/16   [provider]  HUMULIN  N KWIKPEN 100 UNIT/ML KwikPen Inject 6 Units into the skin daily. If BS is under 150 do not take per provider 45 until 15 mg am lunch 15 mg and night 15mg  03/20/24   [provider]  Anselm Oil 300 MG CAPS Take 500 mg by mouth daily.    [provider]  lidocaine  (XYLOCAINE ) 5 % ointment  Apply 1 Application topically as needed. For pain as needed- can use up to 4x/day 01/03/24   Lovorn, Megan, MD  melatonin 10 MG TABS Take 10 mg by mouth at bedtime. 03/31/22   Setzer, Sandra J, PA-C  midodrine  (PROAMATINE ) 5 MG tablet Take 3 tablets (15 mg total) by mouth 3 (three) times daily with meals. 05/23/24   Bensimhon, Daniel R, MD  Multiple Vitamin (MULTIVITAMIN WITH MINERALS) TABS tablet Take 1 tablet by mouth daily.    [provider]  nitroGLYCERIN  (NITROSTAT ) 0.4 MG SL tablet Place 1 tablet (0.4 mg total) under the tongue every 5 (five) minutes as needed for chest pain. Patient not taking: Reported on 06/20/2024 05/08/21   Cantwell, Sherran C, PA-C  pemigatinib  (PEMAZYRE ) 9 MG tablet Take 1 tablet (9 mg total) by mouth daily. Take 1 tablet daily x 14 days on, then 7 day rest. Start on 06/15/24 06/01/24   Cloretta Arley NOVAK, MD  potassium chloride  SA (KLOR-CON  M) 20 MEQ tablet Take 2 tablets (  40 mEq total) by mouth daily. 06/20/24   Glena [name] HERO, FNP  Probiotic Product (PROBIOTIC-10 PO) Take 1 capsule by mouth daily.    [provider]  prochlorperazine  (COMPAZINE ) 5 MG tablet Take 1 tablet (5 mg total) by mouth every 8 (eight) hours as needed for nausea or vomiting. 06/20/24   Debby Olam POUR, NP  rosuvastatin  (CRESTOR ) 20 MG tablet Take 1 tablet (20 mg total) by mouth daily. 07/22/20   Ladona Heinz, MD  sevelamer  carbonate (RENVELA ) 800 MG tablet Take 2 tablets (1,600 mg total) by mouth 3 (three) times daily with meals. 06/15/24   Thomas, Lisa K, NP  sodium chloride  (OCEAN) 0.65 % SOLN nasal spray Place 1 spray into both nostrils as needed for congestion.    [provider]  tamsulosin  (FLOMAX ) 0.4 MG CAPS capsule Take 1 capsule (0.4 mg total) by mouth daily after supper. 04/04/24   Clegg, Amy D, NP  torsemide  (DEMADEX ) 10 MG tablet Take 4 tablets (40 mg total) by mouth 2 (two) times daily. 06/20/24 06/15/25  Glena [name] HERO, FNP                                                                                                                                     Past Surgical History Past Surgical History:  Procedure Laterality Date   ABDOMINAL AORTAGRAM N/A 04/18/2012   Procedure: ABDOMINAL AORTAGRAM;  Surgeon: Lonni GORMAN Blade, MD;  Location[facility] CATH LAB;  Service: Cardiovascular;  Laterality: N/A;   AMPUTATION Right 05/19/2019   Procedure: RIGHT FOOT 5TH RAY AMPUTATION;  Surgeon: Harden Jerona GAILS, MD;  Location: [facility] OR;  Service: Orthopedics;  Laterality: Right;   AMPUTATION Right 03/11/2022   Procedure: RIGHT LEG DEBRIDEMENT VS. BELOW KNEE AMPUTATION;  Surgeon: Harden Jerona GAILS, MD;  Location: [facility] OR;  Service: Orthopedics;  Laterality: Right;   BIV ICD INSERTION CRT-D N/A 10/21/2022   Procedure: BIV ICD INSERTION CRT-D;  Surgeon: Waddell Danelle ORN, MD;  Location: [facility] INVASIVE CV LAB;  Service: Cardiovascular;  Laterality: N/A;   BOWEL RESECTION N/A 11/14/2023   Procedure: SMALL BOWEL RESECTION;  Surgeon: Stevie, Herlene Righter, MD;  Location: [facility] OR;  Service: General;  Laterality: N/A;   CARDIAC CATHETERIZATION N/A 09/08/2016   Procedure: Left Heart Cath and Coronary Angiography;  Surgeon: Heinz Ladona, MD;  Location: [facility] INVASIVE CV LAB;  Service: Cardiovascular;  Laterality: N/A;   CATARACT EXTRACTION, BILATERAL  09/2017, 10/2017   Dr. Cleatus   COLONOSCOPY W/ BIOPSIES AND POLYPECTOMY     CORONARY/GRAFT ACUTE MI REVASCULARIZATION N/A 10/11/2020   Procedure: Coronary/Graft Acute MI Revascularization;  Surgeon: Ladona Heinz, MD;  Location: [facility] INVASIVE CV LAB;  Service: Cardiovascular;  Laterality: N/A;   I & D EXTREMITY Right 03/11/2022   Procedure: BELOW KNEE AMPUTATION;  Surgeon: Harden Jerona GAILS, MD;  Location: [facility] OR;  Service: Orthopedics;  Laterality: Right;   INSERT / REPLACE / REMOVE PACEMAKER  LAPAROSCOPY N/A 11/14/2023   Procedure: LAPAROSCOPY DIAGNOSTIC;  Surgeon: Stevie Herlene Righter, MD;  Location: [facility] OR;  Service: General;  Laterality: N/A;  Converted to  open ex-lap after inserting first trocar   LAPAROTOMY N/A 11/14/2023   Procedure: EXPLORATORY LAPAROTOMY;  Surgeon: Stevie Herlene Righter, MD;  Location: [facility] OR;  Service: General;  Laterality: N/A;   LEFT HEART CATH N/A 12/31/2020   Procedure: Left Heart Cath;  Surgeon: Ladona Heinz, MD;  Location: [facility] INVASIVE CV LAB;  Service: Cardiovascular;  Laterality: N/A;   LEFT HEART CATH AND CORONARY ANGIOGRAPHY N/A 10/11/2020   Procedure: LEFT HEART CATH AND CORONARY ANGIOGRAPHY;  Surgeon: Ladona Heinz, MD;  Location: [facility] INVASIVE CV LAB;  Service: Cardiovascular;  Laterality: N/A;   LOWER EXTREMITY ANGIOGRAM Bilateral 04/18/2012   Procedure: LOWER EXTREMITY ANGIOGRAM;  Surgeon: Lonni GORMAN Blade, MD;  Location: [facility] CATH LAB;  Service: Cardiovascular;  Laterality: Bilateral;  bilat lower extrem angio   LOWER EXTREMITY ANGIOGRAPHY Bilateral 05/02/2019   Procedure: LOWER EXTREMITY ANGIOGRAPHY;  Surgeon: Ladona Heinz, MD;  Location: [facility] INVASIVE CV LAB;  Service: Cardiovascular;  Laterality: Bilateral;   LOWER EXTREMITY ANGIOGRAPHY Bilateral 02/28/2019   Procedure: LOWER EXTREMITY ANGIOGRAPHY;  Surgeon: Ladona Heinz, MD;  Location: [facility] INVASIVE CV LAB;  Service: Cardiovascular;  Laterality: Bilateral;   macular photocoagulation     (eye treatments for diabetic retinopathy)-[name]   PERIPHERAL VASCULAR INTERVENTION  02/28/2019   Procedure: PERIPHERAL VASCULAR INTERVENTION;  Surgeon: Ladona Heinz, MD;  Location: [facility] INVASIVE CV LAB;  Service: Cardiovascular;;   RIGHT HEART CATH N/A 03/29/2024   Procedure: RIGHT HEART CATH;  Surgeon: Zenaida Morene PARAS, MD;  Location: [facility] INVASIVE CV LAB;  Service: Cardiovascular;  Laterality: N/A;   TEE WITHOUT CARDIOVERSION N/A 03/18/2022   Procedure: TRANSESOPHAGEAL ECHOCARDIOGRAM (TEE);  Surgeon: Ladona Heinz, MD;  Location: [facility] ENDOSCOPY;  Service: Cardiovascular;  Laterality: N/A;   VENTRICULAR ASSIST DEVICE INSERTION N/A 12/31/2020   Procedure: VENTRICULAR ASSIST DEVICE  INSERTION;  Surgeon: Ladona Heinz, MD;  Location: [facility] INVASIVE CV LAB;  Service: Cardiovascular;  Laterality: N/A;   Family History Family History  Problem Relation Age of Onset   Diabetes Mother    Hearing loss Mother    Hypertension Mother    Hyperlipidemia Mother    Heart disease Mother    Varicose Veins Mother    Varicose Veins Father    Dementia Father    Hyperlipidemia Brother    Diabetes Maternal Grandmother     Social History Social History   Tobacco Use   Smoking status: Former    Current packs/day: 0.00    Average packs/day: 1 pack/day for 30.0 years (30.0 ttl pk-yrs)    Types: Cigarettes    Start date: 01/07/1982    Quit date: 01/08/2012    Years since quitting: 12.4   Smokeless tobacco: Never  Vaping Use   Vaping status: Never Used  Substance Use Topics   Alcohol  use: Not Currently   Drug use: No   Allergies Empagliflozin , Shellfish-derived products, Latex, Tape, and Testosterone   Review of Systems Review of Systems  Physical Exam Vital Signs  I have reviewed the triage vital signs BP 95/71   Pulse 84   Temp (!) 97.3 F (36.3 C) (Oral)   Resp 10   Ht 5' 9 (1.753 m)   Wt 91.6 kg   SpO2 92%   BMI 29.83 kg/m   Physical Exam Vitals and nursing note reviewed.  Constitutional:      Comments: Chronically ill-appearing  HENT:  Head: Normocephalic.  Cardiovascular:     Rate and Rhythm: Normal rate.  Pulmonary:     Effort: Pulmonary effort is normal.  Abdominal:     General: Abdomen is flat. There is no distension.     Palpations: Abdomen is soft.     Tenderness: There is no abdominal tenderness. There is no guarding.  Musculoskeletal:     Cervical back: Normal range of motion.  Skin:    General: Skin is warm.     Findings: No rash.  Neurological:     General: No focal deficit present.     Cranial Nerves: No cranial nerve deficit.     Motor: No weakness.     ED Results and Treatments Labs (all labs ordered are listed, but only abnormal  results are displayed) Labs Reviewed  COMPREHENSIVE METABOLIC PANEL WITH GFR - Abnormal; Notable for the following components:      Result Value   Glucose, Bld 236 (*)    BUN 67 (*)    Creatinine, Ser 2.20 (*)    Total Protein 6.2 (*)    Albumin  2.7 (*)    AST 45 (*)    ALT 47 (*)    GFR, Estimated 32 (*)    All other components within normal limits  CBC - Abnormal; Notable for the following components:   RDW 18.4 (*)    All other components within normal limits  BRAIN NATRIURETIC PEPTIDE - Abnormal; Notable for the following components:   B Natriuretic Peptide >4,500.0 (*)    All other components within normal limits  CBG MONITORING, ED - Abnormal; Notable for the following components:   Glucose-Capillary 225 (*)    All other components within normal limits  TROPONIN I (HIGH SENSITIVITY) - Abnormal; Notable for the following components:   Troponin I (High Sensitivity) 70 (*)    All other components within normal limits  TROPONIN I (HIGH SENSITIVITY) - Abnormal; Notable for the following components:   Troponin I (High Sensitivity) 65 (*)    All other components within normal limits  RESP PANEL BY RT-PCR (RSV, FLU A&B, COVID)  RVPGX2  URINALYSIS, ROUTINE W REFLEX MICROSCOPIC                                                                                                                          Radiology CT Head Wo Contrast Result Date: 06/26/2024 CLINICAL DATA:  Weakness for 3 days, history of liver cancer, ataxia EXAM: CT HEAD WITHOUT CONTRAST TECHNIQUE: Contiguous axial images were obtained from the base of the skull through the vertex without intravenous contrast. RADIATION DOSE REDUCTION: This exam was performed according to the departmental dose-optimization program which includes automated exposure control, adjustment of the mA and/or kV according to patient size and/or use of iterative reconstruction technique. COMPARISON:  None Available. FINDINGS: Brain: No acute infarct or  hemorrhage. Lateral ventricles and midline structures are unremarkable. No acute extra-axial fluid collections. No mass effect. Vascular: No hyperdense vessel or unexpected  calcification. Skull: Normal. Negative for fracture or focal lesion. Sinuses/Orbits: No acute finding. 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 Other: None. IMPRESSION: 1. No acute intracranial process. Electronically Signed   By: Ozell Daring M.D.   On: 06/26/2024 17:38   DG Chest 1 View Result Date: 06/26/2024 CLINICAL DATA:  Cough EXAM: CHEST  1 VIEW COMPARISON:  Chest x-ray 05/04/2024 FINDINGS: Left-sided ICD is present. The heart is enlarged, unchanged. There central pulmonary vascular congestion. There is a small left pleural effusion with minimal left basilar opacities, unchanged from prior. No pneumothorax or acute fracture. IMPRESSION: 1. Cardiomegaly with central pulmonary vascular congestion. 2. Stable small left pleural effusion with minimal left basilar opacities. Electronically Signed   By: Greig Pique M.D.   On: 06/26/2024 17:07    Pertinent labs & imaging results that were available during my care of the patient were reviewed by me and considered in my medical decision making (see MDM for details).  Medications Ordered in ED Medications  furosemide  (LASIX ) injection 40 mg (has no administration in time range)                                                                                                                                     Procedures Procedures  (including critical care time)  Medical Decision Making / ED Course   This patient presents to the ED for concern of generalized weakness, this involves an extensive number of treatment options, and is a complaint that carries with it a high risk of complications and morbidity.  The differential diagnosis includes anemia, electrolyte abnormalities, dehydration, current chemotherapy use, heart failure exacerbation, pneumonia, viral  syndrome, UTI.  MDM: Patient chronically ill-appearing.  His vital signs are normal.  He has a soft abdomen, clear lung sounds, normal heart sounds.  No obvious trauma on exam.  He has no bruising to the head or face, no neck pain or tenderness.  No tenderness on the chest, upper extremities, abdomen or pelvis.  With the fall, will obtain imaging of the patient's head.  Unclear if the patient struck his head.  Lower suspicion for ICH.  Patient may be experiencing generalized weakness secondary to his current chemotherapy regimen.  May require admission.  Reassessment 7:30 PM-patient with some cardiomegaly, venous congestion.  BNP significant elevated.  Is having some shortness of breath when laying flat.  Likely a bit of heart failure exacerbation.  Will give him some Lasix .  Will admit patient to hospitalist.  Trope stable.   Additional history obtained: -Additional history obtained from EMS -External records from outside source obtained and reviewed including: Chart review including previous notes, labs, imaging, consultation notes   Lab Tests: -I ordered, reviewed, and interpreted labs.   The pertinent results include:   Labs Reviewed  COMPREHENSIVE METABOLIC PANEL WITH GFR - Abnormal; Notable for the following components:      Result Value   Glucose, Bld 236 (*)  BUN 67 (*)    Creatinine, Ser 2.20 (*)    Total Protein 6.2 (*)    Albumin  2.7 (*)    AST 45 (*)    ALT 47 (*)    GFR, Estimated 32 (*)    All other components within normal limits  CBC - Abnormal; Notable for the following components:   RDW 18.4 (*)    All other components within normal limits  BRAIN NATRIURETIC PEPTIDE - Abnormal; Notable for the following components:   B Natriuretic Peptide >4,500.0 (*)    All other components within normal limits  CBG MONITORING, ED - Abnormal; Notable for the following components:   Glucose-Capillary 225 (*)    All other components within normal limits  TROPONIN I (HIGH  SENSITIVITY) - Abnormal; Notable for the following components:   Troponin I (High Sensitivity) 70 (*)    All other components within normal limits  TROPONIN I (HIGH SENSITIVITY) - Abnormal; Notable for the following components:   Troponin I (High Sensitivity) 65 (*)    All other components within normal limits  RESP PANEL BY RT-PCR (RSV, FLU A&B, COVID)  RVPGX2  URINALYSIS, ROUTINE W REFLEX MICROSCOPIC      EKG sinus rhythm, PVC.  No acute ischemia.    EKG Interpretation Date/Time:  Monday June 26 2024 16:21:40 EDT Ventricular Rate:  85 PR Interval:  165 QRS Duration:  159 QT Interval:  447 QTC Calculation: 532 R Axis:   243  Text Interpretation: Sinus rhythm Ventricular premature complex Probable left atrial enlargement Nonspecific IVCD with LAD Consider inferior infarct Anterior infarct, old Confirmed by Mannie Pac [phone]) on 06/26/2024 6:16:41 PM         Imaging Studies ordered: I ordered imaging studies including CT head, chest x-ray I independently visualized and interpreted imaging. I agree with the radiologist interpretation   Medicines ordered and prescription drug management: Meds ordered this encounter  Medications   furosemide  (LASIX ) injection 40 mg    -I have reviewed the patients home medicines and have made adjustments as needed   Cardiac Monitoring: The patient was maintained on a cardiac monitor.  I personally viewed and interpreted the cardiac monitored which showed an underlying rhythm of: Normal sinus rhythm  Social Determinants of Health:  Factors impacting patients care include: Multiple medical comorbidities including cholangiocarcinoma   Reevaluation: After the interventions noted above, I reevaluated the patient and found that they have :improved  Co morbidities that complicate the patient evaluation  Past Medical History:  Diagnosis Date   CHF (congestive heart failure) (HCC)    Chronic kidney disease    stage 3   Colon polyp     Coronary artery disease    Diabetes mellitus 1987   under care of Dr. Tommas.  On insulin  since 96 (off and on)   Diabetic retinopathy    Dupuytren contracture    R hand, s/p injection (Dr. Lorretta)   Essential hypertension, benign    Essential hypertension, benign 02/06/2019   Frequency of urination and polyuria    Hypertension    Myocardial infarction [facility])    denies   Neuromuscular disorder (HCC)    Diabetic neuropathy   Osteomyelitis (HCC)    right foot   Other testicular hypofunction    Peripheral arterial disease (HCC) 10/28/2012   Peritoneal abscess (HCC) 6/08   and buttock.   Pneumonia    Polydipsia    Proteinuria    Pure hyperglyceridemia    Subacute osteomyelitis, right ankle and foot (HCC)  Wears glasses        Final Clinical Impression(s) / ED Diagnoses Final diagnoses:  Acute on chronic congestive heart failure, unspecified heart failure type (HCC)  Cholangiocarcinoma (HCC)     @PCDICTATION @    Mannie Pac T, DO 06/26/24 1941

## 2024-06-26 NOTE — ED Notes (Signed)
 Pt desatting to 88%-89%. Placed on 1L [place]. O2 Sats increased to 94%. Will continue to monitor and titrate prn.

## 2024-06-26 NOTE — Telephone Encounter (Signed)
 LVM instructing Eduardo [name] to call ambulance transport to get him to hospital for evaluation and treatment. This is not something we can turn around as outpatient. Could also be his cardiac issues. Requested she call back to confirm she received message to transport to emergency room at [facility] or [facility].

## 2024-06-26 NOTE — Telephone Encounter (Signed)
 Call from Rosanky, significant other reporting significant decline in condition. He has only eaten bites over the past week (did eat some salmon yesterday). No appetite or taste. Is drinking water (only thing that tastes good) and thinks he is getting at least 64 oz/day. He is voiding. Breathing is labored, even at rest (no oxygen in home). He can barely stand with her helping holding him up. Goes to bathroom w/wheelchair and she had to call paramedic because they could not get him off toilet. Also earlier today, they could not get him back in the bed and had to call paramedic again. He did start cycle #2 of his pemigatinib  on 8/13. Denies any specific complaints or fever. She says I can't do this in the shape he is in. Asking for help/guidance.

## 2024-06-27 ENCOUNTER — Encounter (HOSPITAL_COMMUNITY): Payer: Self-pay | Admitting: Internal Medicine

## 2024-06-27 ENCOUNTER — Inpatient Hospital Stay (HOSPITAL_COMMUNITY)

## 2024-06-27 ENCOUNTER — Ambulatory Visit: Admitting: Cardiology

## 2024-06-27 DIAGNOSIS — Z7189 Other specified counseling: Secondary | ICD-10-CM

## 2024-06-27 DIAGNOSIS — J9601 Acute respiratory failure with hypoxia: Secondary | ICD-10-CM

## 2024-06-27 DIAGNOSIS — J9600 Acute respiratory failure, unspecified whether with hypoxia or hypercapnia: Secondary | ICD-10-CM | POA: Diagnosis not present

## 2024-06-27 DIAGNOSIS — I2489 Other forms of acute ischemic heart disease: Secondary | ICD-10-CM | POA: Diagnosis not present

## 2024-06-27 DIAGNOSIS — L8990 Pressure ulcer of unspecified site, unspecified stage: Secondary | ICD-10-CM | POA: Insufficient documentation

## 2024-06-27 DIAGNOSIS — I509 Heart failure, unspecified: Secondary | ICD-10-CM

## 2024-06-27 DIAGNOSIS — N179 Acute kidney failure, unspecified: Secondary | ICD-10-CM

## 2024-06-27 DIAGNOSIS — R570 Cardiogenic shock: Secondary | ICD-10-CM | POA: Diagnosis not present

## 2024-06-27 DIAGNOSIS — I5023 Acute on chronic systolic (congestive) heart failure: Secondary | ICD-10-CM | POA: Diagnosis not present

## 2024-06-27 LAB — COMPREHENSIVE METABOLIC PANEL WITH GFR
ALT: 42 U/L (ref 0–44)
AST: 41 U/L (ref 15–41)
Albumin: 2.6 g/dL — ABNORMAL LOW (ref 3.5–5.0)
Alkaline Phosphatase: 96 U/L (ref 38–126)
Anion gap: 14 (ref 5–15)
BUN: 66 mg/dL — ABNORMAL HIGH (ref 8–23)
CO2: 21 mmol/L — ABNORMAL LOW (ref 22–32)
Calcium: 9.3 mg/dL (ref 8.9–10.3)
Chloride: 100 mmol/L (ref 98–111)
Creatinine, Ser: 2.28 mg/dL — ABNORMAL HIGH (ref 0.61–1.24)
GFR, Estimated: 30 mL/min — ABNORMAL LOW (ref >60)
Glucose, Bld: 229 mg/dL — ABNORMAL HIGH (ref 70–99)
Potassium: 4.5 mmol/L (ref 3.5–5.1)
Sodium: 135 mmol/L (ref 135–145)
Total Bilirubin: 1.5 mg/dL — ABNORMAL HIGH (ref 0.0–1.2)
Total Protein: 5.9 g/dL — ABNORMAL LOW (ref 6.5–8.1)

## 2024-06-27 LAB — GLUCOSE, CAPILLARY
Glucose-Capillary: 205 mg/dL — ABNORMAL HIGH (ref 70–99)
Glucose-Capillary: 235 mg/dL — ABNORMAL HIGH (ref 70–99)
Glucose-Capillary: 241 mg/dL — ABNORMAL HIGH (ref 70–99)

## 2024-06-27 LAB — PHOSPHORUS: Phosphorus: 4.7 mg/dL — ABNORMAL HIGH (ref 2.5–4.6)

## 2024-06-27 LAB — MRSA NEXT GEN BY PCR, NASAL: MRSA by PCR Next Gen: NOT DETECTED

## 2024-06-27 MED ORDER — GLYCOPYRROLATE 0.2 MG/ML IJ SOLN: 0.2000 mg | INTRAMUSCULAR | Status: DC | PRN

## 2024-06-27 MED ORDER — CLONAZEPAM 0.5 MG PO TABS
0.5000 mg | ORAL_TABLET | Freq: Two times a day (BID) | ORAL | Status: DC
  Administered 2024-06-27: 0.5 mg via ORAL
  Filled 2024-06-27: qty 1

## 2024-06-27 MED ORDER — NOREPINEPHRINE 4 MG/250ML-% IV SOLN
INTRAVENOUS | Status: AC
  Administered 2024-06-27: 5 ug/min via INTRAVENOUS
  Filled 2024-06-27: qty 250

## 2024-06-27 MED ORDER — HYDROMORPHONE BOLUS VIA INFUSION: 1.0000 mg | INTRAVENOUS | Status: DC | PRN

## 2024-06-27 MED ORDER — ACETAMINOPHEN 325 MG PO TABS
650.0000 mg | ORAL_TABLET | Freq: Four times a day (QID) | ORAL | Status: DC | PRN
Start: 2024-06-27 — End: 2024-06-29

## 2024-06-27 MED ORDER — GLYCOPYRROLATE 1 MG PO TABS: 1.0000 mg | ORAL_TABLET | ORAL | Status: DC | PRN

## 2024-06-27 MED ORDER — FUROSEMIDE 10 MG/ML IJ SOLN
40.0000 mg | Freq: Once | INTRAMUSCULAR | Status: AC
  Administered 2024-06-27: 40 mg via INTRAVENOUS
  Filled 2024-06-27: qty 4

## 2024-06-27 MED ORDER — CHLORHEXIDINE GLUCONATE CLOTH 2 % EX PADS
6.0000 | MEDICATED_PAD | Freq: Every day | CUTANEOUS | Status: DC
  Administered 2024-06-27 – 2024-06-29 (×2): 6 via TOPICAL

## 2024-06-27 MED ORDER — ORAL CARE MOUTH RINSE: 15.0000 mL | OROMUCOSAL | Status: DC | PRN

## 2024-06-27 MED ORDER — NOREPINEPHRINE 4 MG/250ML-% IV SOLN: 0.0000 ug/min | INTRAVENOUS | Status: DC

## 2024-06-27 MED ORDER — ACETAMINOPHEN 650 MG RE SUPP: 650.0000 mg | Freq: Four times a day (QID) | RECTAL | Status: DC | PRN

## 2024-06-27 MED ORDER — ENSURE PLUS HIGH PROTEIN PO LIQD: 237.0000 mL | Freq: Two times a day (BID) | ORAL | Status: DC

## 2024-06-27 MED ORDER — SODIUM CHLORIDE 0.9 % IV SOLN: INTRAVENOUS | Status: AC

## 2024-06-27 MED ORDER — MORPHINE SULFATE (PF) 2 MG/ML IV SOLN: 2.0000 mg | INTRAVENOUS | Status: DC | PRN

## 2024-06-27 MED ORDER — GLYCOPYRROLATE 0.2 MG/ML IJ SOLN
0.2000 mg | INTRAMUSCULAR | Status: DC | PRN
Start: 2024-06-27 — End: 2024-06-29
  Administered 2024-06-29: 0.2 mg via INTRAVENOUS
  Filled 2024-06-27: qty 1

## 2024-06-27 MED ORDER — SODIUM CHLORIDE 0.9 % IV SOLN: 250.0000 mL | INTRAVENOUS | Status: AC

## 2024-06-27 MED ORDER — ORAL CARE MOUTH RINSE
15.0000 mL | OROMUCOSAL | Status: DC
  Administered 2024-06-29: 15 mL via OROMUCOSAL

## 2024-06-27 MED ORDER — SODIUM CHLORIDE 0.9 % IV SOLN: INTRAVENOUS | Status: DC | PRN

## 2024-06-27 MED ORDER — HYDROMORPHONE HCL-NACL 50-0.9 MG/50ML-% IV SOLN
0.0000 mg/h | INTRAVENOUS | Status: DC
  Administered 2024-06-27: 1 mg/h via INTRAVENOUS
  Administered 2024-06-28 – 2024-06-29 (×2): 1.5 mg/h via INTRAVENOUS
  Filled 2024-06-27 (×3): qty 50

## 2024-06-27 MED ORDER — FUROSEMIDE 10 MG/ML IJ SOLN
40.0000 mg | Freq: Every day | INTRAMUSCULAR | Status: DC
  Administered 2024-06-29: 40 mg via INTRAVENOUS
  Filled 2024-06-27 (×2): qty 4

## 2024-06-27 MED ORDER — POLYVINYL ALCOHOL 1.4 % OP SOLN: 1.0000 [drp] | Freq: Four times a day (QID) | OPHTHALMIC | Status: DC | PRN

## 2024-06-27 MED ORDER — PROCHLORPERAZINE EDISYLATE 10 MG/2ML IJ SOLN
10.0000 mg | Freq: Four times a day (QID) | INTRAMUSCULAR | Status: DC | PRN
  Administered 2024-06-27: 10 mg via INTRAVENOUS
  Filled 2024-06-27: qty 2

## 2024-06-27 NOTE — Hospital Course (Addendum)
 68 year old man complex PMH including chronic biventricular heart failure status post ICD placement presented with generalized weakness, shortness of breath, orthopnea, lower extremity edema.  Admitted for acute on chronic heart failure, hypoxia, AKI.   Consultants Cardiology  PCCM  Procedures/Events 8/18 admit for CHF 8/19 transferred to SDU for resp distress, BiPAP. Transferred to [facility] service.

## 2024-06-27 NOTE — IPAL (Signed)
  Interdisciplinary Goals of Care Family Meeting   Date carried out: 06/27/2024  Location of the meeting: Bedside  Member's involved: Family Member or next of kin and Other: Physician assistant  Durable Power of Attorney or acting medical decision maker: spouse, [name]     Discussion: We discussed goals of care for Eduardo [name]. [name] has gotten significantly worse today in the setting of cardiogenic shock. 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 Family and [name] agreed that he would not want intubation at this time. He also would not want CPR if his heart were to stop. We discussed continuing current management including BiPap and vasopressors. I explained that if he were to worsen or become tired it would be reasonable to switch our focus to keeping him comfortable. Wife tells me that this is something they have discussed at length outside of the hospital but are not ready to transition to yet. Code status was changed in chart to reflect this conversation.   Code status:   Code Status: Do not attempt resuscitation (DNR) PRE-ARREST INTERVENTIONS DESIRED   Disposition: Continue current acute care  Time spent for the meeting: 15 minutes    Rexene LOISE Blush, PA-C  06/27/2024, 2:32 PM

## 2024-06-27 NOTE — Progress Notes (Signed)
 Patient now on CCM service. He is full comfort care. D/w Dr Jadine of [facility] - tasnfer back to [facility]     SIGNATURE    Dr. Dorethia Cave, M.D., F.C.C.P,  Pulmonary and Critical Care Medicine Staff Physician, [facility] Center Director - Interstitial Lung Disease  Program  Pulmonary Fibrosis [facility] at [facility] [place], [place], [address]   Pager: [phone], If no answer  -> Check AMION or Try [phone] Telephone (clinical office): [phone] Telephone (research): [phone]  6:09 PM 06/27/2024

## 2024-06-27 NOTE — Progress Notes (Signed)
  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  Admitted for acute on chronic heart failure, hypoxia, AKI.   Consultants Cardiology  PCCM  Procedures/Events 8/18 admit for CHF 8/19 transferred to SDU for resp distress, BiPAP. Transferred to [facility] service.   Assessment and Plan: Acute on chronic HFrEF/biventricular heart failure Acute hypoxic respiratory failure with respiratory distress Demand ischemia Multivessel CAD Status post ICD NSVT QTc prolongation Last echo done 05/05/2024 EF 20 to 25%, RV systolic function severely reduced, mildly elevated pulmonary artery systolic pressure, and moderate mitral regurgitation.  Markedly elevated BNP.  Troponins mildly elevated. Current respiratory distress, concern for respiratory failure.  Transfer immediately to stepdown unit, initiate BiPAP.  Seen on unit, condition marginal.  PCCM consulted, taking over care.  Discussed briefly with cardiology, may end up transferring to to heart.  Defer management to PCCM and cardiology.  AKI on CKD stage IIIa Likely cardiorenal in etiology given acutely decompensated CHF.  Creatinine 2.2 (baseline 1.5-1.7).   IV Lasix  redosed this morning.  Further management per above service lines.  Chronic hypotension Was on midodrine .  May need pressor support for diuresis.   Type 2 diabetes CBG 200s.  May need to adjust insulin .    Cholangiocarcinoma Currently being treated with pemigatinib .  Outpatient oncology follow-up.    PAD Hyperlipidemia Continue home meds.   BPH Continue Flomax .  Patient in distress, transferred to critical care, discussed with critical care and cardiology.  [facility]  hospitalists will sign off.     Subjective:  Feels short of breath Nurse at bedside, patient tachypneic and appears dyspneic.  Physical Exam: Vitals:   06/27/24 0959 06/27/24 1120 06/27/24 1137 06/27/24 1200  BP: (!) 82/64  91/66   Pulse: 95 100 94   Resp:  (!) 23 (!) 28   Temp:    97.7 F (36.5 C)  TempSrc:    Axillary  SpO2: 100% 98% 99%   Weight:    95.5 kg  Height:    5' 9 (1.753 m)   Physical Exam Vitals reviewed.  Constitutional:      General: He is in acute distress.     Appearance: He is ill-appearing and toxic-appearing.  Cardiovascular:     Rate and Rhythm: Normal rate and regular rhythm.     Heart sounds: No murmur heard. Pulmonary:     Breath sounds: No wheezing, rhonchi or rales.     Comments: Marked increased work of breathing, respiratory distress, concern for respiratory failure. Musculoskeletal:     Right lower leg: No edema (righ BKA).     Left lower leg: Edema present.  Neurological:     Mental Status: He is alert.  Psychiatric:        Mood and Affect: Mood normal.        Behavior: Behavior normal.     Data Reviewed: Creatinine 2.28 EKG paced rhythm  Family Communication: none  Disposition: Status is: Inpatient Remains inpatient appropriate because: acute hypoxia     Time spent: 35 minutes  Author: Toribio Door, MD 06/27/2024 12:25 PM  For on call review [email or web address].

## 2024-06-27 NOTE — Progress Notes (Signed)
 RT attempted A-line 2 times unsuccessfully. RN and NP notified.

## 2024-06-27 NOTE — Evaluation (Addendum)
 Occupational Therapy Evaluation Patient Details Name: PATRIC BUCKHALTER MRN: [id number] DOB: [birth year] Today's Date: 06/27/2024   History of Present Illness   68 year old male admitted for acute on chronic heart failure, hypoxia, AKI. Of note, pt was admitted 05/03/24-05/09/24 for septic shock 2/2 GBS bacteremia. Complex PMH including chronic biventricular heart failure s/p ICD placement, HTN, recent diagnosis of cholangiocarcinoma, R BKA, NSVT, type 2 diabetes, SBO s/p exploratory laparotomy and small bowel resection, PAD, Stage 2-3 CKD, SOB, orthopnea, lower extremity edema.     Clinical Impressions Pt seen for limited OT evaluation. RN cleared for participation prior to start of session. Pt received in side-lying, coughing up phlegm with increased work of breathing/intermittent dry heaving. Pt denies pain although he states he feels unwell. NT in room to assist with boosting pt up in bed to assist with upright postioning (MAX A +2), pt able to roll to supine with MIN A. Once repositioned, pt verbalizes that onset of dry heaving is new. RN called into room to further assess pt's status and vitals; RN notifying MD via secure chat. Further mobility and ADL assessment not completed due to pt's medical status (BP 85/68 (75), HR 95 bpm, RR 31, SpO2 98% on 2L via [place]), pt left with RN in room.   PLOF taken from chart review as pt was recently admitted in July 2025. Pt lives at home with significant other, was mod independent with R LE prothesis including driving. Currently, patient presents with deficits outlined below (see OT Problem List for details) most significantly generalized muscle weakness, balance and activity tolerance deficits impacting functional mobility and ADL's. Patient requires continued acute care hospital level OT services to progress safety and functional performance and allow for discharge. OT will continue to assess functional status and update discharge recommendations as appropriate.  Patient will benefit from continued inpatient follow up therapy, <3 hours/day.      If plan is discharge home, recommend the following:   Two people to help with walking and/or transfers;Two people to help with bathing/dressing/bathroom;Assistance with cooking/housework;Direct supervision/assist for medications management;Direct supervision/assist for financial management;Assist for transportation;Help with stairs or ramp for entrance;Supervision due to cognitive status     Functional Status Assessment   Patient has had a recent decline in their functional status and demonstrates the ability to make significant improvements in function in a reasonable and predictable amount of time.     Equipment Recommendations   None recommended by OT (defer to next LOC)      Precautions/Restrictions   Precautions Precautions: Fall Precaution/Restrictions Comments: chronic low BP, R BKA Restrictions Other Position/Activity Restrictions: R BKA     Mobility Bed Mobility Overal bed mobility: Needs Assistance Bed Mobility: Rolling Rolling: Min assist         General bed mobility comments: Pt requires +2 MAX A to boost up in bed for repositioning to assist with respiration    Transfers Overall transfer level: Needs assistance                 General transfer comment: Unsafe to attempt      Balance Overall balance assessment: Needs assistance     Sitting balance - Comments: Unsafe to attempt                                   ADL either performed or assessed with clinical judgement   ADL Overall ADL's : Needs assistance/impaired Eating/Feeding: NPO  Grooming: Bed level;Supervision/safety   Upper Body Bathing: Bed level;Supervision/ safety   Lower Body Bathing: Maximal assistance;Bed level   Upper Body Dressing : Bed level;Minimal assistance   Lower Body Dressing: Bed level;Maximal assistance     Toilet Transfer Details (indicate cue type and  reason): NT due to pt status Toileting- Clothing Manipulation and Hygiene: Maximal assistance;Bed level         General ADL Comments: Formal ADL assessment deferred due to pt's respiratory status, RN called in room to address.     Vision Baseline Vision/History: 0 No visual deficits Ability to See in Adequate Light: 0 Adequate Patient Visual Report: No change from baseline              Pertinent Vitals/Pain Pain Assessment Pain Assessment: PAINAD Breathing: occasional labored breathing, short period of hyperventilation Negative Vocalization: occasional moan/groan, low speech, negative/disapproving quality Facial Expression: sad, frightened, frown Body Language: tense, distressed pacing, fidgeting Consolability: distracted or reassured by voice/touch PAINAD Score: 5 Pain Intervention(s): Monitored during session, Repositioned     Extremity/Trunk Assessment Upper Extremity Assessment Upper Extremity Assessment: Generalized weakness;Right hand dominant   Lower Extremity Assessment Lower Extremity Assessment: Generalized weakness       Communication Communication Communication: No apparent difficulties   Cognition Arousal: Alert Behavior During Therapy: Restless, Flat affect Cognition: Difficult to assess, No family/caregiver present to determine baseline             OT - Cognition Comments: limited by increased WOB/dry heaving, will continue to assess                 Following commands: Intact       Cueing  General Comments   Cueing Techniques: Verbal cues  RN called in to room to assess pt due to increased work of breathing, dry heaving, pt coughing up phlegm. BP 85/68 (75), HR 95 bpm, RR 31, SpO2 98% on 2L via [place]. RN notifiying MD of pt status.           Home Living Family/patient expects to be discharged to:: Private residence Living Arrangements: Spouse/significant other;Other relatives Available Help at Discharge: Family Type of Home:  House Home Access: Ramped entrance     Home Layout: One level     Bathroom Shower/Tub: Producer, television/film/video: Standard Bathroom Accessibility: Yes How Accessible: Accessible via walker;Accessible via wheelchair Home Equipment: Rolling Walker (2 wheels);Rollator (4 wheels);Wheelchair - manual;BSC/3in1;Shower seat;Grab bars - toilet          Prior Functioning/Environment Prior Level of Function : Independent/Modified Independent             Mobility Comments: walks without AD, drives, uses WC at night for trips to bathroom 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, independent with ADLs    OT Problem List: Decreased strength;Decreased range of motion;Decreased activity tolerance;Impaired balance (sitting and/or standing);Cardiopulmonary status limiting activity;Increased edema;Decreased knowledge of precautions;Decreased knowledge of use of DME or AE   OT Treatment/Interventions: Self-care/ADL training;Therapeutic exercise;Neuromuscular education;Energy conservation;DME and/or AE instruction;Therapeutic activities;Patient/family education;Balance training      OT Goals(Current goals can be found in the care plan section)   Acute Rehab OT Goals OT Goal Formulation: Patient unable to participate in goal setting Time For Goal Achievement: 07/11/24 Potential to Achieve Goals: Fair   OT Frequency:  Min 2X/week       AM-PAC OT 6 Clicks Daily Activity     Outcome Measure Help from another person eating meals?: Total Help from another person taking care of personal  grooming?: A Lot Help from another person toileting, which includes using toliet, bedpan, or urinal?: A Lot Help from another person bathing (including washing, rinsing, drying)?: A Lot Help from another person to put on and taking off regular upper body clothing?: A Lot Help from another person to put on and taking off regular lower body clothing?: A Lot 6 Click Score: 11   End of Session  Equipment Utilized During Treatment: Oxygen Nurse Communication: Other (comment) (pt with increased work of breathing, coughing up phlegm)  Activity Tolerance: Treatment limited secondary to medical complications (Comment) (RN called into room to assess pt's new c/o dry heaving and coughing up phlegm with increased work of breathing.) Patient left: with nursing/sitter in room;with call bell/phone within reach;in bed (RN in room notifiying MD)  OT Visit Diagnosis: Unsteadiness on feet (R26.81);Other abnormalities of gait and mobility (R26.89);Repeated falls (R29.6);Muscle weakness (generalized) (M62.81);History of falling (Z91.81)                Time: 9059-9040 OT Time Calculation (min): 19 min Charges:  OT Evaluation $OT Eval Moderate Complexity: 1 Mod  Piya Mesch L. Chellsea Beckers, OTR/L  06/27/24, 1:33 PM

## 2024-06-27 NOTE — Progress Notes (Signed)
 Heart Failure Navigator Progress Note  Assessed for Heart & Vascular TOC clinic readiness.  Patient does not meet criteria due to Advanced Heart Failure Team patient of Dr. Gala Romney. .   Navigator will sign off at this time.   Rhae Hammock, BSN, Scientist, clinical (histocompatibility and immunogenetics) Only

## 2024-06-27 NOTE — Consult Note (Signed)
 ATTESTATION & SIGNATURE   STAFF NOTE: I, Dr CHRISTELLA Cave have personally reviewed patient's available data, including medical history, events of note, physical examination and test results as part of my evaluation. I have discussed with resident/NP and other care providers such as pharmacist, RN and RRT.  In addition,  I personally evaluated patient and elicited key findings of   S: Date of admit 06/26/2024 with LOS 1 for today 06/27/2024 : Eduardo [name] is  new consult from Dr Rolan Door . PATient in respiratory extremis. Chart revierw shws baseline s-cHF ef 15. S/PLAN: defib but does not want v  not a LVAD candidate. Also, cholangiocarcinoma. BP soft but adequate. Cardiology attending DR Delford Dinsmore status is No CPR but yes for intubation  D/w Dr Fontaine - who recommended comfort care  O:  Blood pressure 91/66, pulse 94, temperature 97.7 F (36.5 C), temperature source Axillary, resp. rate (!) 28, height 5' 9 (1.753 m), weight 95.5 kg, SpO2 99%.   On BiPAP Looks fatigued Mod Resp distress Not paradoxical C.o worsening and exhaustion   LABS    PULMONARY No results for input(s): PHART, PCO2ART, PO2ART, HCO3, TCO2, O2SAT in the last 168 hours.  Invalid input(s): PCO2, PO2  CBC Recent Labs  Lab 06/26/24 1634  HGB 13.1  HCT 41.7  WBC 6.5  PLT 207    COAGULATION No results for input(s): INR in the last 168 hours.  CARDIAC  No results for input(s): TROPONINI in the last 168 hours. No results for input(s): PROBNP in the last 168 hours.   CHEMISTRY Recent Labs  Lab 06/26/24 1634 06/26/24 2319 06/27/24 0704  NA 136  --  135  K 4.3  --  4.5  CL 99  --  100  CO2 25  --  21*  GLUCOSE 236*  --  229*  BUN 67*  --  66*  CREATININE 2.20*  --  2.28*  CALCIUM  9.2  --  9.3  MG  --  2.7*  --   PHOS  --   --  4.7*   Estimated Creatinine Clearance: 35.4 mL/min (A) (by C-G formula based on SCr of 2.28 mg/dL (H)).   LIVER Recent Labs  Lab  06/26/24 1634 06/27/24 0704  AST 45* 41  ALT 47* 42  ALKPHOS 97 96  BILITOT 1.2 1.5*  PROT 6.2* 5.9*  ALBUMIN  2.7* 2.6*     INFECTIOUS No results for input(s): LATICACIDVEN, PROCALCITON in the last 168 hours.   ENDOCRINE CBG (last 3)  Recent Labs    06/27/24 0049 06/27/24 0742 06/27/24 1145  GLUCAP 241* 205* 235*         IMAGING x24h  - image(s) personally visualized  -   highlighted in bold DG CHEST PORT 1 VIEW Result Date: 06/27/2024 CLINICAL DATA:  Pulmonary edema. EXAM: PORTABLE CHEST 1 VIEW COMPARISON:  Pulmonary edema. FINDINGS: Stable cardiomediastinal silhouette. Left-sided defibrillator is unchanged. Small left pleural effusion is noted with associated left basilar atelectasis or infiltrate. Right lung is clear. Bony thorax is unremarkable. IMPRESSION: Small left pleural effusion is noted with associated left basilar atelectasis or infiltrate. Electronically Signed   By: Eduardo Landy Raddle M.D.   On: 06/27/2024 13:04   CT Head Wo Contrast Result Date: 06/26/2024 CLINICAL DATA:  Weakness for 3 days, history of liver cancer, ataxia EXAM: CT HEAD WITHOUT CONTRAST TECHNIQUE: Contiguous axial images were obtained from the base of the skull through the vertex without intravenous contrast. RADIATION DOSE REDUCTION: This exam was  performed according to the departmental dose-optimization program which includes automated exposure control, adjustment of the mA and/or kV according to patient size and/or use of iterative reconstruction technique. COMPARISON:  None Available. FINDINGS: Brain: No acute infarct or hemorrhage. Lateral ventricles and midline structures are unremarkable. No acute extra-axial fluid collections. No mass effect. Vascular: No hyperdense vessel or unexpected calcification. Skull: Normal. Negative for fracture or focal lesion. Sinuses/Orbits: No acute finding. 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 Other: None. IMPRESSION: 1. No acute intracranial  process. Electronically Signed   By: Ozell Daring M.D.   On: 06/26/2024 17:38   DG Chest 1 View Result Date: 06/26/2024 CLINICAL DATA:  Cough EXAM: CHEST  1 VIEW COMPARISON:  Chest x-ray 05/04/2024 FINDINGS: Left-sided ICD is present. The heart is enlarged, unchanged. There central pulmonary vascular congestion. There is a small left pleural effusion with minimal left basilar opacities, unchanged from prior. No pneumothorax or acute fracture. IMPRESSION: 1. Cardiomegaly with central pulmonary vascular congestion. 2. Stable small left pleural effusion with minimal left basilar opacities. Electronically Signed   By: Greig Pique M.D.   On: 06/26/2024 17:07      A: Acute resp failure Cardiogenic shock Known advanced end stage Chf and cholangiocarinoma  P: BiPAP Levoiphed via PIV Sort out goals of care re intubation - APP d/w Wife and she exprsses desire for no pain and no suffering. APP has invited her into bedside for conversation   Anti-infectives (From admission, onward)    None        Rest per NP/medical resident whose note is outlined above and that I agree with  The patient is critically ill with multiple organ systems failure and requires high complexity decision making for assessment and support, frequent evaluation and titration of therapies, application of advanced monitoring technologies and extensive interpretation of multiple databases.   Critical Care Time devoted to patient care services described in this note is  30  Minutes. This time reflects time of care of this signee Dr Dorethia Cave. This critical care time does not reflect procedure time, or teaching time or supervisory time of PA/NP/Med student/Med Resident etc but could involve care discussion time     Dr. Dorethia Cave, M.D., [facility].C.P Pulmonary and Critical Care Medicine Staff Physician [facility] [place] [facility] Pager: [phone], If no answer or between  15:00h -  7:00h: call [phone]  06/27/2024 1:37 PM

## 2024-06-27 NOTE — Consult Note (Signed)
 NAME:  Eduardo [name], MRN:  [id number], DOB:  04-16-1956, LOS: 1 ADMISSION DATE:  06/26/2024, CONSULTATION DATE:  06/27/2024 REFERRING MD:  Dr. 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 He was found to be volume overloaded on exam with BNP >4500 and admitted for management of heart failure exacerbation. He was given IV Lasix  on the floor.  This morning he developed respiratory distress with tachypnea and accessory muscle use and was placed on BiPap. PCCM was consulted for further management.  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 He does endorse difficulty breathing that is not improving.   Objective    Blood pressure 91/66, pulse 94, temperature 97.7 F (36.5 C), temperature source Axillary, resp. rate (!) 28, height 5' 9 (1.753 m), weight 95.5 kg, SpO2 99%.    FiO2 (%):  [40 %] 40 % PEEP:  [6 cmH20] 6 cmH20   Intake/Output Summary (Last 24 hours) at 06/27/2024 1233 Last data  filed at 06/27/2024 0800 Gross per 24 hour  Intake 478 ml  Output 890 ml  Net -412 ml   Filed Weights   06/26/24 1627 06/27/24 0617 06/27/24 1200  Weight: 91.6 kg 88.9 kg 95.5 kg    Examination: General: elderly man, chronically ill appearing HENT: normocephalic, atraumatic  Lungs: breath sounds diminished bilateral bases Cardiovascular: paced, regular rate and rhythm  Abdomen: soft, non-tender, non-distended Extremities: cool to touch, s/p R BKA  Neuro: somnolent and minimally interactive, moves all extremities spontaneously GU: n/a  Resolved problem list   Assessment and Plan   Cardiogenic shock due to acute on chronic biventricular heart failure Found to be volume overloaded on presentation with pitting edema and pulmonary venous congestion on CXR. BNP>4500. Most recent Echo 05/05/2024 with EF 20-25%, severe RV, moderate MR and mild TR. He follows with [facility], Dr. Cherrie, and in 2022 declined VAD. On exam currently with cool extremities and hypotension with SBP 70-80's.  - Patient has end stage heart failure and significant comorbidity including active cholangiocarcinoma. He is unlikely to survive intubation and furthermore unlikely to liberate from the vent given significant deconditioning and with no real exit strategy given end stage heart failure. Per discussion with family patient is DNR/DNI at this time will continue BiPap and current vasopressor support. See separate IPAL note. Also discussed with patient's cardiologist, Dr. Bensimohn, who agrees with futility of aggressive measures  - Continue levophed  to maintain MAP>65 - Had previously not tolerated GDMT due to hypotension, continue home midodrine   - Will send blood cultures but hold off  on empiric antibiotics since unlikely shock is due to infection (no leukocytosis, fever or localizing signs of infection) - Diurese for goal slightly negative as tolerated, received 40 mg IV Lasix  this morning  Acute  hypoxic respiratory failure, multifactorial, due to cardiogenic shock, pulmonary venous congestion and deconditioning Requiring BiPap for respiratory distress marked by tachypnea and increased work of breathing. CXR shows L pleural effusion.  - Continue BiPap for now - Diuresis as above - L pleural effusion is small and unlikely to be driving factor of acute respiratory distress  Severe 3V CAD PAD HLD On medical management for CAD, unable to revascularize. - Continue home Plavix  - Continue home statin  AKI on CKD stage IIIa Baseline Cr~1.5-1.7. Cr on admission 2.2. AKI likely due to cardiogenic shock.  - Continue to trend daily BMP - Diuresis per above  Intrahepatic cholangiocarcinoma  Followed by oncology, [name]. Began treatment with Pemigatinib  05/31/2024.   History of BPH - Hold home Flomax  with hypotension  T2DM - Continue to monitor BG ACHS, continue home basal insulin  and SSI  Best Practice (right click and Reselect all SmartList Selections daily)   Diet/type: NPO DVT prophylaxis LMWH Pressure ulcer(s): N/A GI prophylaxis: N/A Lines: N/A Foley:  will place Code Status:  DNR Last date of multidisciplinary goals of care discussion [8/19] - see separate IPAL note  Labs   CBC: Recent Labs  Lab 06/26/24 1634  WBC 6.5  HGB 13.1  HCT 41.7  MCV 92.1  PLT 207    Basic Metabolic Panel: Recent Labs  Lab 06/26/24 1634 06/26/24 2319 06/27/24 0704  NA 136  --  135  K 4.3  --  4.5  CL 99  --  100  CO2 25  --  21*  GLUCOSE 236*  --  229*  BUN 67*  --  66*  CREATININE 2.20*  --  2.28*  CALCIUM  9.2  --  9.3  MG  --  2.7*  --   PHOS  --   --  4.7*   GFR: Estimated Creatinine Clearance: 35.4 mL/min (A) (by C-G formula based on SCr of 2.28 mg/dL (H)). Recent Labs  Lab 06/26/24 1634  WBC 6.5    Liver Function Tests: Recent Labs  Lab 06/26/24 1634 06/27/24 0704  AST 45* 41  ALT 47* 42  ALKPHOS 97 96  BILITOT 1.2 1.5*  PROT 6.2* 5.9*  ALBUMIN   2.7* 2.6*   No results for input(s): LIPASE, AMYLASE in the last 168 hours. No results for input(s): AMMONIA in the last 168 hours.  ABG    Component Value Date/Time   PHART 7.267 (L) 10/11/2020 0918   PCO2ART 54.1 (H) 10/11/2020 0918   PO2ART 105 10/11/2020 0918   HCO3 24.5 03/29/2024 1259   HCO3 24.8 03/29/2024 1259   TCO2 26 03/29/2024 1259   TCO2 26 03/29/2024 1259   ACIDBASEDEF 3.0 (H) 10/11/2020 0918   O2SAT 50.9 04/04/2024 0418     Coagulation Profile: No results for input(s): INR, PROTIME in the last 168 hours.  Cardiac Enzymes: No results for input(s): CKTOTAL, CKMB, CKMBINDEX, TROPONINI in the last 168 hours.  HbA1C: Hemoglobin A1C  Date/Time Value Ref Range Status  10/19/2023 12:00 AM 6.6  Final  04/22/2023 12:00 AM 6.9  Final   Hgb A1c MFr Bld  Date/Time Value Ref Range Status  03/24/2024 10:39 PM 6.7 (H) 4.8 - 5.6 % Final    Comment:    (NOTE) Pre diabetes:  5.7%-6.4%  Diabetes:              >6.4%  Glycemic control for   <7.0% adults with diabetes     CBG: Recent Labs  Lab 06/26/24 1919 06/27/24 0049 06/27/24 0742 06/27/24 1145  GLUCAP 225* 241* 205* 235*    Review of Systems:    Unable to obtain patient lethargic on BiPap. Nods yes to shortness of breath  Past Medical History:  He,  has a past medical history of CHF (congestive heart failure) (HCC), Chronic kidney disease, Colon polyp, Coronary artery disease, Diabetes mellitus (1987), Diabetic retinopathy, Dupuytren contracture, Essential hypertension, benign, Essential hypertension, benign (02/06/2019), Frequency of urination and polyuria, Hypertension, Myocardial infarction [facility]), Neuromuscular disorder (HCC), Osteomyelitis (HCC), Other testicular hypofunction, Peripheral arterial disease (HCC) (10/28/2012), Peritoneal abscess (HCC) (6/08), Pneumonia, Polydipsia, Proteinuria, Pure hyperglyceridemia, Subacute osteomyelitis, right ankle and foot (HCC), and Wears  glasses.   Surgical History:   Past Surgical History:  Procedure Laterality Date   ABDOMINAL AORTAGRAM N/A 04/18/2012   Procedure: ABDOMINAL EZELLA;  Surgeon: Lonni GORMAN Blade, MD;  Location: [facility] CATH LAB;  Service: Cardiovascular;  Laterality: N/A;   AMPUTATION Right 05/19/2019   Procedure: RIGHT FOOT 5TH RAY AMPUTATION;  Surgeon: Harden Jerona GAILS, MD;  Location: [facility] OR;  Service: Orthopedics;  Laterality: Right;   AMPUTATION Right 03/11/2022   Procedure: RIGHT LEG DEBRIDEMENT VS. BELOW KNEE AMPUTATION;  Surgeon: Harden Jerona GAILS, MD;  Location: [facility] OR;  Service: Orthopedics;  Laterality: Right;   BIV ICD INSERTION CRT-D N/A 10/21/2022   Procedure: BIV ICD INSERTION CRT-D;  Surgeon: Waddell Danelle ORN, MD;  Location: [facility] INVASIVE CV LAB;  Service: Cardiovascular;  Laterality: N/A;   BOWEL RESECTION N/A 11/14/2023   Procedure: SMALL BOWEL RESECTION;  Surgeon: Stevie, Herlene Righter, MD;  Location: [facility] OR;  Service: General;  Laterality: N/A;   CARDIAC CATHETERIZATION N/A 09/08/2016   Procedure: Left Heart Cath and Coronary Angiography;  Surgeon: Gordy Bergamo, MD;  Location: [facility] INVASIVE CV LAB;  Service: Cardiovascular;  Laterality: N/A;   CATARACT EXTRACTION, BILATERAL  09/2017, 10/2017   Dr. Cleatus   COLONOSCOPY W/ BIOPSIES AND POLYPECTOMY     CORONARY/GRAFT ACUTE MI REVASCULARIZATION N/A 10/11/2020   Procedure: Coronary/Graft Acute MI Revascularization;  Surgeon: Bergamo Gordy, MD;  Location: [facility] INVASIVE CV LAB;  Service: Cardiovascular;  Laterality: N/A;   I & D EXTREMITY Right 03/11/2022   Procedure: BELOW KNEE AMPUTATION;  Surgeon: Harden Jerona GAILS, MD;  Location: [facility] OR;  Service: Orthopedics;  Laterality: Right;   INSERT / REPLACE / REMOVE PACEMAKER     LAPAROSCOPY N/A 11/14/2023   Procedure: LAPAROSCOPY DIAGNOSTIC;  Surgeon: Stevie, Herlene Righter, MD;  Location: [facility] OR;  Service: General;  Laterality: N/A;  Converted to open ex-lap after inserting first trocar   LAPAROTOMY N/A 11/14/2023    Procedure: EXPLORATORY LAPAROTOMY;  Surgeon: Stevie Herlene Righter, MD;  Location: [facility] OR;  Service: General;  Laterality: N/A;   LEFT HEART CATH N/A 12/31/2020   Procedure: Left Heart Cath;  Surgeon: Bergamo Gordy, MD;  Location: [facility] INVASIVE CV LAB;  Service: Cardiovascular;  Laterality: N/A;   LEFT HEART CATH AND CORONARY ANGIOGRAPHY N/A 10/11/2020   Procedure: LEFT HEART CATH AND CORONARY ANGIOGRAPHY;  Surgeon: Bergamo Gordy, MD;  Location: [facility] INVASIVE CV LAB;  Service: Cardiovascular;  Laterality: N/A;   LOWER EXTREMITY ANGIOGRAM Bilateral 04/18/2012   Procedure: LOWER EXTREMITY ANGIOGRAM;  Surgeon: Lonni GORMAN Blade, MD;  Location: [facility] CATH LAB;  Service: Cardiovascular;  Laterality: Bilateral;  bilat lower extrem angio   LOWER EXTREMITY ANGIOGRAPHY Bilateral 05/02/2019   Procedure: LOWER EXTREMITY ANGIOGRAPHY;  Surgeon: Ladona Heinz, MD;  Location: [facility] INVASIVE CV LAB;  Service: Cardiovascular;  Laterality: Bilateral;   LOWER EXTREMITY ANGIOGRAPHY Bilateral 02/28/2019   Procedure: LOWER EXTREMITY ANGIOGRAPHY;  Surgeon: Ladona Heinz, MD;  Location: [facility] INVASIVE CV LAB;  Service: Cardiovascular;  Laterality: Bilateral;   macular photocoagulation     (eye treatments for diabetic retinopathy)-[name]   PERIPHERAL VASCULAR INTERVENTION  02/28/2019   Procedure: PERIPHERAL VASCULAR INTERVENTION;  Surgeon: Ladona Heinz, MD;  Location: [facility] INVASIVE CV LAB;  Service: Cardiovascular;;   RIGHT HEART CATH N/A 03/29/2024   Procedure: RIGHT HEART CATH;  Surgeon: Zenaida Morene PARAS, MD;  Location: [facility] INVASIVE CV LAB;  Service: Cardiovascular;  Laterality: N/A;   TEE WITHOUT CARDIOVERSION N/A 03/18/2022   Procedure: TRANSESOPHAGEAL ECHOCARDIOGRAM (TEE);  Surgeon: Ladona Heinz, MD;  Location: [facility] ENDOSCOPY;  Service: Cardiovascular;  Laterality: N/A;   VENTRICULAR ASSIST DEVICE INSERTION N/A 12/31/2020   Procedure: VENTRICULAR ASSIST DEVICE INSERTION;  Surgeon: Ladona Heinz, MD;  Location: [facility] INVASIVE CV LAB;  Service:  Cardiovascular;  Laterality: N/A;     Social History:   reports that he quit smoking about 12 years ago. His smoking use included cigarettes. He started smoking about 42 years ago. He has a 30 pack-year smoking history. He has never used smokeless tobacco. He reports that he does not currently use alcohol . He reports that he does not use drugs.   Family History:  His family history includes Dementia in his father; Diabetes in his maternal grandmother and mother; Hearing loss in his mother; Heart disease in his mother; Hyperlipidemia in his brother and mother; Hypertension in his mother; Varicose Veins in his father and mother.   Allergies Allergies  Allergen Reactions   Empagliflozin      Other Reaction(s): foot infection   Shellfish-Derived Products Nausea And Vomiting and Other (See Comments)    Only Mussels cause severe nausea and vomiting    Latex Rash   Tape Rash and Other (See Comments)    Caused issues with the skin   Testosterone  Rash    did not feel well      Home Medications  Prior to Admission medications   Medication Sig Start Date End Date Taking? Authorizing Provider  Bioflavonoid Products (ESTER C PO) Take 1,000 mg by mouth daily.   Yes [provider]  Cholecalciferol  (VITAMIN D ) 2000 units CAPS Take 2,000 Units by mouth daily.   Yes [provider]  clopidogrel  (PLAVIX ) 75 MG tablet TAKE 1 TABLET BY MOUTH DAILY 04/21/24  Yes Patwardhan, Manish J, MD  ezetimibe  (ZETIA ) 10 MG tablet TAKE 1 TABLET BY MOUTH DAILY 04/21/24  Yes Patwardhan, Manish J, MD  famotidine  (PEPCID ) 10 MG tablet Take 10 mg by mouth 2 (two) times daily as needed for heartburn or indigestion.   Yes [provider]  gemfibrozil  (LOPID ) 600 MG tablet Take 600 mg by mouth 2 (two) times daily before a meal.   Yes [provider]  HUMULIN  N KWIKPEN 100 UNIT/ML KwikPen Inject 6 Units into the skin daily. If BS is under 150 do not take per provider 45 until 15 mg am lunch  15 mg and night 15mg  03/20/24  Yes [provider]  Anselm Oil 300 MG CAPS Take 500 mg by mouth daily.   Yes [provider]  lidocaine  (XYLOCAINE ) 5 % ointment Apply 1 Application topically as needed. For pain as needed- can  use up to 4x/day 01/03/24  Yes Lovorn, Megan, MD  melatonin 10 MG TABS Take 10 mg by mouth at bedtime. Patient taking differently: Take 10 mg by mouth at bedtime as needed. 03/31/22  Yes Setzer, Sandra J, PA-C  midodrine  (PROAMATINE ) 5 MG tablet Take 3 tablets (15 mg total) by mouth 3 (three) times daily with meals. 05/23/24  Yes Bensimhon, Daniel R, MD  Multiple Vitamin (MULTIVITAMIN WITH MINERALS) TABS tablet Take 1 tablet by mouth daily.   Yes [provider]  nitroGLYCERIN  (NITROSTAT ) 0.4 MG SL tablet Place 1 tablet (0.4 mg total) under the tongue every 5 (five) minutes as needed for chest pain. 05/08/21  Yes Cantwell, Celeste C, PA-C  pemigatinib  (PEMAZYRE ) 9 MG tablet Take 1 tablet (9 mg total) by mouth daily. Take 1 tablet daily x 14 days on, then 7 day rest. Start on 06/15/24 06/01/24  Yes Cloretta Arley NOVAK, MD  potassium chloride  SA (KLOR-CON  M) 20 MEQ tablet Take 2 tablets (40 mEq total) by mouth daily. Patient taking differently: Take 20 mEq by mouth 2 (two) times daily. 06/20/24  Yes Milford, [name] HERO, FNP  Probiotic Product (PROBIOTIC-10 PO) Take 1 capsule by mouth daily.   Yes [provider]  prochlorperazine  (COMPAZINE ) 5 MG tablet Take 1 tablet (5 mg total) by mouth every 8 (eight) hours as needed for nausea or vomiting. 06/20/24  Yes Debby Olam POUR, NP  rosuvastatin  (CRESTOR ) 20 MG tablet Take 1 tablet (20 mg total) by mouth daily. 07/22/20  Yes Ladona Heinz, MD  sevelamer  carbonate (RENVELA ) 800 MG tablet Take 2 tablets (1,600 mg total) by mouth 3 (three) times daily with meals. 06/15/24  Yes Thomas, Lisa K, NP  sodium chloride  (OCEAN) 0.65 % SOLN nasal spray Place 1 spray into both nostrils as needed for congestion.   Yes [provider]  tamsulosin  (FLOMAX ) 0.4 MG CAPS capsule Take 1 capsule (0.4 mg total) by mouth daily after supper. 04/04/24  Yes Clegg, Amy D, NP  torsemide  (DEMADEX ) 10 MG tablet Take 4 tablets (40 mg total) by mouth 2 (two) times daily. 06/20/24 06/15/25 Yes Milford, [name] HERO, FNP  amiodarone  (PACERONE ) 200 MG tablet Take 1 tablet (200 mg total) by mouth daily. Patient not taking: Reported on 06/26/2024 06/08/24   Bensimhon, Toribio SAUNDERS, MD  Continuous Glucose Sensor (FREESTYLE LIBRE 2 PLUS SENSOR) MISC 1 Application. 03/20/24   [provider]  HUMALOG KWIKPEN 100 UNIT/ML KiwkPen Inject 3-10 Units into the skin 3 (three) times daily. Inject 01-1009 units into the skin three times a day, per sliding scale- based on BGL >100 Patient not taking: Reported on 06/26/2024 08/02/16   [provider]     Critical care time: 55 minutes    The patient is critically ill with multiple organ system failure and requires high complexity decision making for assessment and support, frequent evaluation and titration of therapies, advanced monitoring, review of radiographic studies and interpretation of complex data.   Critical Care Time devoted to patient care services, exclusive of separately billable procedures, described in this note is 55 minutes.  Rexene LOISE Blush, PA-C [facility] 06/27/24 2:32 PM  Please see [email or web address] for pager details.  From 7A-7P if no response, please call [phone] After hours, please call ELink [phone]]

## 2024-06-27 NOTE — Progress Notes (Signed)
 PT Cancellation Note  Patient Details Name: WEBER MONNIER MRN: [id number] DOB: [birth year]   Cancelled Treatment:    Reason Eval/Treat Not Completed: Patient not medically ready Per OT, pt dry heaving, increased RR with low BP & nurse notifying MD. Will hold PT evaluation at this time.  Richerd Pinal, PT, DPT 06/27/24, 10:10 AM   Richerd CHRISTELLA Pinal 06/27/2024, 10:09 AM

## 2024-06-27 NOTE — Consult Note (Signed)
 Cardiology Consultation   Patient ID: Eduardo [name] MRN: [id number]; DOB: 1956/04/30  Admit date: 06/26/2024 Date of Consult: 06/27/2024  PCP:  [name], MD   [facility] Providers Cardiologist:  Newman JINNY Lawrence, MD  Electrophysiologist:  Danelle Birmingham, MD       Patient Profile: Eduardo [name] is a 68 y.o. 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 who is being seen 06/27/2024 for the evaluation of CHF at the request of Toribio Door, MD.  History of Present Illness: [name] is a 68 year old male with prior cardiac history listed below.  Was seen at [facility] emergency department on 10/2020 for acute hypoxic respiratory failure was found to have a left bundle branch block, pulmonary edema, and a code STEMI was called.  A cath showed severe diffuse disease in the LAD with ipsilateral and contralateral collaterals, ostially occluded first OM, moderate proximal and mid RCA stenosis with 90% stenosis at the PDA PL bifurcation, and an LVEF of 15 to 20%.  Because no targets were identified this was managed medically.  Was admitted to the CCU for cardiogenic shock.  High-sensitivity troponins peaked at 27,000.  Echo showed an LVEF of 20 to 25%.  Patient stabilized and was sent home.  On 11/2020 was seen in the VAD clinic and decided against this.  Was admitted on 12/2020 for advanced failure and an LVEF of 15%.  Cath showed 100% stenosis in LAD, 100% stenosis in first OM, and 99% stenosis in distal RCA.  Intervention was attempted but was unsuccessful.  On 10/2022 the patient received a biventricular [facility] ICD because he has consistently had a reduced LVEF.  On 11/2023 the patient underwent a small bowel resection and anastomosis because he had an  obstruction from a liver mass.  The biopsy findings were most consistent with cholangiocarcinoma.  On 04/2024 was admitted for sepsis and bacteremia from group B strep.  At this time echo showed an LVEF of 20-25%, severely reduced RV, and moderate MR. Given patient's risk it was decided to avoid pursuing a TEE and ICD evaluation.  He was treated as a presumed positive for endocarditis.    He had a right heart cath on 03/2024 with severely elevated left-sided filling pressure, PCWP of 29, and cardiac index of 1.8  He is followed by the advanced heart failure clinic and was last seen on 06/20/2024.  He has been on midodrine  for hypotension, and was on torsemide  40 mg twice daily.  At the last visit amiodarone  was stopped to see if patient had improved.  Presented to the emergency department planing of generalized weakness for the past 2 to 3 days.  Patient had an SpO2 in the high 80s on room air and was placed on oxygen  On interview patient reported that he has had worsening shortness of breath, lower extremity edema, and generalized weakness for the past 2 to 3 days.  Having difficulty using walker to get around.  Denies chest pain, diaphoresis, cough, fever, chills, melena, hematochezia, and hematuria.  Continues to have orthopnea.   Labs showed elevated BNP of greater than 4500, high-sensitivity troponins 70 > 65, potassium of 4.5, creatinine of 2.28 baseline 1.5-1.7, BUN of 66, respiratory panel was negative.  Chest x-ray showed  Cardiomegaly with central pulmonary vascular congestion. Stable small left pleural  effusion with minimal left basilar opacities.  EKG showed atrial sensed ventricular paced rhythm with a rate of 96 and a PVC.   Past Medical History:  Diagnosis Date   CHF (congestive heart failure) (HCC)    Chronic kidney disease    stage 3   Colon polyp    Coronary artery disease    Diabetes mellitus 1987   under care of Dr. Tommas.  On insulin  since 96 (off and on)   Diabetic  retinopathy    Dupuytren contracture    R hand, s/p injection (Dr. Lorretta)   Essential hypertension, benign    Essential hypertension, benign 02/06/2019   Frequency of urination and polyuria    Hypertension    Myocardial infarction [facility])    denies   Neuromuscular disorder (HCC)    Diabetic neuropathy   Osteomyelitis (HCC)    right foot   Other testicular hypofunction    Peripheral arterial disease (HCC) 10/28/2012   Peritoneal abscess (HCC) 6/08   and buttock.   Pneumonia    Polydipsia    Proteinuria    Pure hyperglyceridemia    Subacute osteomyelitis, right ankle and foot (HCC)    Wears glasses     Past Surgical History:  Procedure Laterality Date   ABDOMINAL AORTAGRAM N/A 04/18/2012   Procedure: ABDOMINAL EZELLA;  Surgeon: Lonni GORMAN Blade, MD;  Location: [facility] CATH LAB;  Service: Cardiovascular;  Laterality: N/A;   AMPUTATION Right 05/19/2019   Procedure: RIGHT FOOT 5TH RAY AMPUTATION;  Surgeon: Harden Jerona GAILS, MD;  Location: [facility] OR;  Service: Orthopedics;  Laterality: Right;   AMPUTATION Right 03/11/2022   Procedure: RIGHT LEG DEBRIDEMENT VS. BELOW KNEE AMPUTATION;  Surgeon: Harden Jerona GAILS, MD;  Location: [facility] OR;  Service: Orthopedics;  Laterality: Right;   BIV ICD INSERTION CRT-D N/A 10/21/2022   Procedure: BIV ICD INSERTION CRT-D;  Surgeon: Waddell Danelle ORN, MD;  Location: [facility] INVASIVE CV LAB;  Service: Cardiovascular;  Laterality: N/A;   BOWEL RESECTION N/A 11/14/2023   Procedure: SMALL BOWEL RESECTION;  Surgeon: Stevie, Herlene Righter, MD;  Location: [facility] OR;  Service: General;  Laterality: N/A;   CARDIAC CATHETERIZATION N/A 09/08/2016   Procedure: Left Heart Cath and Coronary Angiography;  Surgeon: Gordy Bergamo, MD;  Location: [facility] INVASIVE CV LAB;  Service: Cardiovascular;  Laterality: N/A;   CATARACT EXTRACTION, BILATERAL  09/2017, 10/2017   Dr. Cleatus   COLONOSCOPY W/ BIOPSIES AND POLYPECTOMY     CORONARY/GRAFT ACUTE MI REVASCULARIZATION N/A 10/11/2020   Procedure:  Coronary/Graft Acute MI Revascularization;  Surgeon: Bergamo Gordy, MD;  Location: [facility] INVASIVE CV LAB;  Service: Cardiovascular;  Laterality: N/A;   I & D EXTREMITY Right 03/11/2022   Procedure: BELOW KNEE AMPUTATION;  Surgeon: Harden Jerona GAILS, MD;  Location: [facility] OR;  Service: Orthopedics;  Laterality: Right;   INSERT / REPLACE / REMOVE PACEMAKER     LAPAROSCOPY N/A 11/14/2023   Procedure: LAPAROSCOPY DIAGNOSTIC;  Surgeon: Stevie, Herlene Righter, MD;  Location: [facility] OR;  Service: General;  Laterality: N/A;  Converted to open ex-lap after inserting first trocar   LAPAROTOMY N/A 11/14/2023   Procedure: EXPLORATORY LAPAROTOMY;  Surgeon: Stevie Herlene Righter, MD;  Location: [facility] OR;  Service: General;  Laterality: N/A;   LEFT HEART CATH N/A 12/31/2020   Procedure: Left Heart Cath;  Surgeon: Bergamo Gordy, MD;  Location: [facility] INVASIVE CV LAB;  Service: Cardiovascular;  Laterality: N/A;   LEFT HEART CATH AND CORONARY ANGIOGRAPHY N/A 10/11/2020   Procedure: LEFT HEART CATH AND CORONARY ANGIOGRAPHY;  Surgeon: Ladona Heinz, MD;  Location: [facility] INVASIVE CV LAB;  Service: Cardiovascular;  Laterality: N/A;   LOWER EXTREMITY ANGIOGRAM Bilateral 04/18/2012   Procedure: LOWER EXTREMITY ANGIOGRAM;  Surgeon: Lonni GORMAN Blade, MD;  Location: [facility] CATH LAB;  Service: Cardiovascular;  Laterality: Bilateral;  bilat lower extrem angio   LOWER EXTREMITY ANGIOGRAPHY Bilateral 05/02/2019   Procedure: LOWER EXTREMITY ANGIOGRAPHY;  Surgeon: Ladona Heinz, MD;  Location: [facility] INVASIVE CV LAB;  Service: Cardiovascular;  Laterality: Bilateral;   LOWER EXTREMITY ANGIOGRAPHY Bilateral 02/28/2019   Procedure: LOWER EXTREMITY ANGIOGRAPHY;  Surgeon: Ladona Heinz, MD;  Location: [facility] INVASIVE CV LAB;  Service: Cardiovascular;  Laterality: Bilateral;   macular photocoagulation     (eye treatments for diabetic retinopathy)-[name]   PERIPHERAL VASCULAR INTERVENTION  02/28/2019   Procedure: PERIPHERAL VASCULAR INTERVENTION;  Surgeon: Ladona Heinz, MD;   Location: [facility] INVASIVE CV LAB;  Service: Cardiovascular;;   RIGHT HEART CATH N/A 03/29/2024   Procedure: RIGHT HEART CATH;  Surgeon: Zenaida Morene PARAS, MD;  Location: [facility] INVASIVE CV LAB;  Service: Cardiovascular;  Laterality: N/A;   TEE WITHOUT CARDIOVERSION N/A 03/18/2022   Procedure: TRANSESOPHAGEAL ECHOCARDIOGRAM (TEE);  Surgeon: Ladona Heinz, MD;  Location: [facility] ENDOSCOPY;  Service: Cardiovascular;  Laterality: N/A;   VENTRICULAR ASSIST DEVICE INSERTION N/A 12/31/2020   Procedure: VENTRICULAR ASSIST DEVICE INSERTION;  Surgeon: Ladona Heinz, MD;  Location: [facility] INVASIVE CV LAB;  Service: Cardiovascular;  Laterality: N/A;     Home Medications:  Prior to Admission medications   Medication Sig Start Date End Date Taking? Authorizing Provider  Bioflavonoid Products (ESTER C PO) Take 1,000 mg by mouth daily.   Yes [provider]  Cholecalciferol  (VITAMIN D ) 2000 units CAPS Take 2,000 Units by mouth daily.   Yes [provider]  clopidogrel  (PLAVIX ) 75 MG tablet TAKE 1 TABLET BY MOUTH DAILY 04/21/24  Yes Patwardhan, Manish J, MD  ezetimibe  (ZETIA ) 10 MG tablet TAKE 1 TABLET BY MOUTH DAILY 04/21/24  Yes Patwardhan, Manish J, MD  famotidine  (PEPCID ) 10 MG tablet Take 10 mg by mouth 2 (two) times daily as needed for heartburn or indigestion.   Yes [provider]  gemfibrozil  (LOPID ) 600 MG tablet Take 600 mg by mouth 2 (two) times daily before a meal.   Yes [provider]  HUMULIN  N KWIKPEN 100 UNIT/ML KwikPen Inject 6 Units into the skin daily. If BS is under 150 do not take per provider 45 until 15 mg am lunch 15 mg and night 15mg  03/20/24  Yes [provider]  Anselm Oil 300 MG CAPS Take 500 mg by mouth daily.   Yes [provider]  lidocaine  (XYLOCAINE ) 5 % ointment Apply 1 Application topically as needed. For pain as needed- can use up to 4x/day 01/03/24  Yes Lovorn, Megan, MD  melatonin 10 MG TABS Take 10 mg by mouth at bedtime. Patient taking  differently: Take 10 mg by mouth at bedtime as needed. 03/31/22  Yes Setzer, Sandra J, PA-C  midodrine  (PROAMATINE ) 5 MG tablet Take 3 tablets (15 mg total) by mouth 3 (three) times daily with meals. 05/23/24  Yes Bensimhon, Daniel R, MD  Multiple Vitamin (MULTIVITAMIN WITH MINERALS) TABS tablet Take 1 tablet by mouth daily.   Yes [provider]  nitroGLYCERIN  (NITROSTAT ) 0.4 MG SL tablet Place 1 tablet (0.4 mg total) under the tongue every 5 (five) minutes as needed for chest pain. 05/08/21  Yes Cantwell, Celeste C, PA-C  pemigatinib  (PEMAZYRE ) 9 MG tablet Take 1 tablet (9 mg  total) by mouth daily. Take 1 tablet daily x 14 days on, then 7 day rest. Start on 06/15/24 06/01/24  Yes Cloretta Arley NOVAK, MD  potassium chloride  SA (KLOR-CON  M) 20 MEQ tablet Take 2 tablets (40 mEq total) by mouth daily. Patient taking differently: Take 20 mEq by mouth 2 (two) times daily. 06/20/24  Yes Milford, [name] HERO, FNP  Probiotic Product (PROBIOTIC-10 PO) Take 1 capsule by mouth daily.   Yes [provider]  prochlorperazine  (COMPAZINE ) 5 MG tablet Take 1 tablet (5 mg total) by mouth every 8 (eight) hours as needed for nausea or vomiting. 06/20/24  Yes Debby Olam POUR, NP  rosuvastatin  (CRESTOR ) 20 MG tablet Take 1 tablet (20 mg total) by mouth daily. 07/22/20  Yes Ladona Heinz, MD  sevelamer  carbonate (RENVELA ) 800 MG tablet Take 2 tablets (1,600 mg total) by mouth 3 (three) times daily with meals. 06/15/24  Yes Thomas, Lisa K, NP  sodium chloride  (OCEAN) 0.65 % SOLN nasal spray Place 1 spray into both nostrils as needed for congestion.   Yes [provider]  tamsulosin  (FLOMAX ) 0.4 MG CAPS capsule Take 1 capsule (0.4 mg total) by mouth daily after supper. 04/04/24  Yes Clegg, Amy D, NP  torsemide  (DEMADEX ) 10 MG tablet Take 4 tablets (40 mg total) by mouth 2 (two) times daily. 06/20/24 06/15/25 Yes Milford, [name] HERO, FNP  amiodarone  (PACERONE ) 200 MG tablet Take 1 tablet (200 mg total) by mouth  daily. Patient not taking: Reported on 06/26/2024 06/08/24   Bensimhon, Toribio SAUNDERS, MD  Continuous Glucose Sensor (FREESTYLE LIBRE 2 PLUS SENSOR) MISC 1 Application. 03/20/24   [provider]  HUMALOG KWIKPEN 100 UNIT/ML KiwkPen Inject 3-10 Units into the skin 3 (three) times daily. Inject 01-1009 units into the skin three times a day, per sliding scale- based on BGL >100 Patient not taking: Reported on 06/26/2024 08/02/16   [provider]    Scheduled Meds:  clopidogrel   75 mg Oral Daily   enoxaparin  (LOVENOX ) injection  40 mg Subcutaneous Q24H   ezetimibe   10 mg Oral Daily   feeding supplement  237 mL Oral BID BM   gemfibrozil   600 mg Oral BID AC   insulin  aspart  0-5 Units Subcutaneous QHS   insulin  aspart  0-9 Units Subcutaneous TID WC   insulin  NPH Human  6 Units Subcutaneous QAC breakfast   midodrine   15 mg Oral TID WC   rosuvastatin   20 mg Oral Daily   sevelamer  carbonate  1,600 mg Oral TID WC   tamsulosin   0.4 mg Oral QPC supper   Continuous Infusions:  PRN Meds:   Allergies:    Allergies  Allergen Reactions   Empagliflozin      Other Reaction(s): foot infection   Shellfish-Derived Products Nausea And Vomiting and Other (See Comments)    Only Mussels cause severe nausea and vomiting    Latex Rash   Tape Rash and Other (See Comments)    Caused issues with the skin   Testosterone  Rash    did not feel well     Social History:   Social History   Socioeconomic History   Marital status: Widowed    Spouse name: Not on file   Number of children: 0   Years of education: Not on file   Highest education level: Not on file  Occupational History   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 Retired. 1 cat.  Girlfriend and her daughter lives with him. Enjoys fishing.   Updated 06/2021   Social Drivers of Health   Financial Resource Strain: Low Risk  (07/28/2023)   Overall Financial Resource Strain (CARDIA)    Difficulty of Paying Living Expenses: Not hard at all  Food Insecurity: No Food Insecurity (06/27/2024)   Hunger Vital Sign    Worried About Running Out of Food in the Last Year: Never true    Ran Out of Food in the Last Year: Never true  Transportation Needs: No Transportation Needs (06/27/2024)   PRAPARE - Administrator, Civil Service (Medical): No    Lack of Transportation (Non-Medical): No  Physical Activity: Sufficiently Active (07/28/2023)   Exercise Vital Sign    Days of Exercise per Week: 7 days    Minutes of Exercise per Session: 90 min  Stress: No Stress Concern Present (07/28/2023)   [employer] of Occupational Health - Occupational Stress Questionnaire    Feeling of Stress : Not at all  Social Connections: Moderately Isolated (06/27/2024)   Social Connection and Isolation Panel    Frequency of Communication with Friends and Family: More than three times a week    Frequency of Social Gatherings with Friends and Family: Never    Attends Religious Services: Never    Database administrator or Organizations: No    Attends Banker Meetings: Never    Marital Status: Living with partner  Intimate Partner Violence: Not At Risk (06/27/2024)   Humiliation, Afraid, Rape, and Kick questionnaire    Fear of Current or Ex-Partner: No    Emotionally Abused: No    Physically Abused: No    Sexually Abused: No    Family  History:    Family History  Problem Relation Age of Onset   Diabetes Mother    Hearing loss Mother    Hypertension Mother    Hyperlipidemia Mother    Heart disease Mother    Varicose Veins Mother    Varicose Veins Father    Dementia Father    Hyperlipidemia Brother    Diabetes Maternal Grandmother      ROS:  Please see the history of present illness.   All other ROS reviewed and negative.     Physical Exam/Data: Vitals:   06/26/24 2359 06/27/24 0418 06/27/24 0617 06/27/24 0800  BP: 99/76 93/65  98/63  Pulse: 89 99  87  Resp: (!) 25 17  18   Temp: 97.8 F (36.6 C) 98.7 F (37.1 C)  98.4 F (36.9 C)  TempSrc: Oral   Oral  SpO2: 92% 93%  97%  Weight:   88.9 kg   Height:        Intake/Output Summary (Last 24 hours) at 06/27/2024 0954 Last data filed at 06/27/2024 0800 Gross per 24 hour  Intake 478 ml  Output 890 ml  Net -  412 ml      06/27/2024    6:17 AM 06/26/2024    4:27 PM 06/20/2024    1:20 PM  Last 3 Weights  Weight (lbs) 195 lb 15.1 oz 202 lb 202 lb 3.2 oz  Weight (kg) 88.88 kg 91.627 kg 91.717 kg     Body mass index is 28.94 kg/m.  General:  Well nourished, well developed, has increased work of breathing and is using accessory muscles. sitting up straight in bed.  On 2 L nasal cannula.  Alert and oriented HEENT: normal Neck: Positive JVD Vascular: No carotid bruits; Distal pulses 2+ bilaterally Cardiac:  normal S1, S2; RRR; no murmur. Lungs: Diffuse wheezing heard throughout the lungs. Abd: soft, nontender, no hepatomegaly  Ext: 1+ edema Musculoskeletal: Has right below the knee amputation Skin: warm and dry  Neuro:   no focal abnormalities noted Psych:  Normal affect   EKG:  The EKG was personally reviewed and demonstrates:  atrial sensed ventricular paced rhythm with a rate of 96 and a PVC. Telemetry:  Telemetry was personally reviewed and demonstrates: A V paced rhythm with heart rates in the 90s and few PVCs.  Relevant CV Studies: Most recent  echo on 04/2024 1. Left ventricular ejection fraction, by estimation, is 20 to 25%. The  left ventricle has severely decreased function. The left ventricle  demonstrates global hypokinesis. The left ventricular internal cavity size  was severely dilated. Left ventricular  diastolic parameters are indeterminate.   2. Right ventricular systolic function is severely reduced. The right  ventricular size is severely enlarged. There is mildly elevated pulmonary  artery systolic pressure. The estimated right ventricular systolic  pressure is 39.8 mmHg.   3. Left atrial size was moderately dilated.   4. The mitral valve is normal in structure. Moderate mitral valve  regurgitation. No evidence of mitral stenosis.   5. The aortic valve is tricuspid. There is moderate calcification of the  aortic valve. Aortic valve regurgitation is not visualized. Aortic valve  sclerosis/calcification is present, without any evidence of aortic  stenosis.   6. The inferior vena cava is dilated in size with <50% respiratory  variability, suggesting right atrial pressure of 15 mmHg.   Conclusion(s)/Recommendation(s): No evidence of valvular vegetations on  this transthoracic echocardiogram. Consider a transesophageal  echocardiogram to exclude infective endocarditis if clinically indicated.    Laboratory Data: High Sensitivity Troponin:   Recent Labs  Lab 06/26/24 1653 06/26/24 1838  TROPONINIHS 70* 65*     Chemistry Recent Labs  Lab 06/26/24 1634 06/26/24 2319 06/27/24 0704  NA 136  --  135  K 4.3  --  4.5  CL 99  --  100  CO2 25  --  21*  GLUCOSE 236*  --  229*  BUN 67*  --  66*  CREATININE 2.20*  --  2.28*  CALCIUM  9.2  --  9.3  MG  --  2.7*  --   GFRNONAA 32*  --  30*  ANIONGAP 12  --  14    Recent Labs  Lab 06/26/24 1634 06/27/24 0704  PROT 6.2* 5.9*  ALBUMIN  2.7* 2.6*  AST 45* 41  ALT 47* 42  ALKPHOS 97 96  BILITOT 1.2 1.5*   Lipids No results for input(s): CHOL, TRIG, HDL,  LABVLDL, LDLCALC, CHOLHDL in the last 168 hours.  Hematology Recent Labs  Lab 06/26/24 1634  WBC 6.5  RBC 4.53  HGB 13.1  HCT 41.7  MCV 92.1  MCH 28.9  MCHC  31.4  RDW 18.4*  PLT 207   Thyroid  No results for input(s): TSH, FREET4 in the last 168 hours.  BNP Recent Labs  Lab 06/26/24 1653  BNP >4,500.0*    DDimer No results for input(s): DDIMER in the last 168 hours.  Radiology/Studies:  CT Head Wo Contrast Result Date: 06/26/2024 CLINICAL DATA:  Weakness for 3 days, history of liver cancer, ataxia EXAM: CT HEAD WITHOUT CONTRAST TECHNIQUE: Contiguous axial images were obtained from the base of the skull through the vertex without intravenous contrast. RADIATION DOSE REDUCTION: This exam was performed according to the departmental dose-optimization program which includes automated exposure control, adjustment of the mA and/or kV according to patient size and/or use of iterative reconstruction technique. COMPARISON:  None Available. FINDINGS: Brain: No acute infarct or hemorrhage. Lateral ventricles and midline structures are unremarkable. No acute extra-axial fluid collections. No mass effect. Vascular: No hyperdense vessel or unexpected calcification. Skull: Normal. Negative for fracture or focal lesion. Sinuses/Orbits: No acute finding. 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 Other: None. IMPRESSION: 1. No acute intracranial process. Electronically Signed   By: Ozell Daring M.D.   On: 06/26/2024 17:38   DG Chest 1 View Result Date: 06/26/2024 CLINICAL DATA:  Cough EXAM: CHEST  1 VIEW COMPARISON:  Chest x-ray 05/04/2024 FINDINGS: Left-sided ICD is present. The heart is enlarged, unchanged. There central pulmonary vascular congestion. There is a small left pleural effusion with minimal left basilar opacities, unchanged from prior. No pneumothorax or acute fracture. IMPRESSION: 1. Cardiomegaly with central pulmonary vascular congestion. 2. Stable small left pleural  effusion with minimal left basilar opacities. Electronically Signed   By: Greig Pique M.D.   On: 06/26/2024 17:07     Assessment and Plan: Eduardo [name] is a 68 y.o. 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 who is being seen 06/27/2024 for the evaluation of CHF at the request of Toribio Door, MD.  Ischemic cardiomyopathy HFrEF s/p [facility] Jude biventricular ICD Increased fatigue and shortness of breath Is followed by advanced heart failure and PTA was on 40 mg torsemide  twice daily. This was recently decreased due to poor intake. Presented to the emergency department for shortness of breath and generalized fatigue for the past 2 to 3 days.  Has increased work of breathing on exam Chest x-ray showed  Cardiomegaly with central pulmonary vascular congestion. Stable small left pleural effusion with minimal left basilar opacities. Labs showed elevated BNP of greater than 4500, potassium of 4.5, Magnesium  2.7, creatinine of 2.28 baseline 1.5-1.7, BUN of 66, respiratory panel was negative. Most recent weight is 88.9 kg this is down from weight at recent outpatient visit on 06/20/2024 when it was 91.6 Kg's and down from admission when it was 91.6 Kgs.  I's and O's are net out 0.53 L. On exam does not appear to be significantly volume overloaded. Has poor oral intake. Elevated Creatinine and BUN are likely due to over diuresis.  Start IV Lasix  40mg  daily. GDMT GDMT limited by hypotension   Hypotension Blood pressure is hypotensive.  Most recent BP is 82/64 Continue midodrine  15 mg twice daily   Elevated high-sensitivity troponins high-sensitivity troponins 70 > 65.  Denies any chest pain EKG showed atrial sensed ventricular paced rhythm with a rate of 96 and a PVC. Most  consistent with demand ischemia  from heart failure   Severe three-vessel CAD Hyperlipidemia Continue Zetia  10 mg daily Continue Crestor  20 mg daily  Continue Plavix  75 mg daily.    Cholangiocarcinoma of the liver currently on pemigatinib  Is followed by oncology   Otherwise managed per primary   Risk Assessment/Risk Scores:     [place]  Heart Association (NYHA) Functional Class NYHA Class IV       For questions or updates, please contact [facility] Please consult [email or web address] for contact info under    Signed, Morse Clause, PA-C  06/27/2024 9:54 AM

## 2024-06-27 NOTE — TOC Initial Note (Signed)
 Transition of Care [facility]) - Initial/Assessment Note   Patient Details  Name: Eduardo [name] MRN: [id number] Date of Birth: 04/24/1956  Transition of Care [facility]) CM/SW Contact:    Duwaine GORMAN Aran, LCSW Phone Number: 06/27/2024, 12:58 PM  Clinical Narrative: Patient is from home. Care management consulted for heart failure screening, but heart failure navigation team has already screened out patient. Patient recently discharged from [facility] through [facility] on 06/12/24. PT/OT consulted, but patient is not yet medically ready to participate. Care management awaiting recommendations.  Expected Discharge Plan:  (TBD) Barriers to Discharge: Continued Medical Work up  Expected Discharge Plan and Services In-house Referral: Clinical Social Work Living arrangements for the past 2 months: Single Family Home           DME Arranged: N/A DME Agency: NA  Prior Living Arrangements/Services Living arrangements for the past 2 months: Single Family Home Lives with:: Significant Other Patient language and need for interpreter reviewed:: Yes Do you feel safe going back to the place where you live?: Yes      Need for Family Participation in Patient Care: No (Comment) Care giver support system in place?: Yes (comment) Criminal Activity/Legal Involvement Pertinent to Current Situation/Hospitalization: No - Comment as needed  Activities of Daily Living ADL Screening (condition at time of admission) Independently performs ADLs?: Yes (appropriate for developmental age) Is the patient deaf or have difficulty hearing?: No Does the patient have difficulty seeing, even when wearing glasses/contacts?: No Does the patient have difficulty concentrating, remembering, or making decisions?: No  Emotional Assessment Orientation: : Oriented to Self, Oriented to Place, Oriented to  Time, Oriented to Situation Alcohol  / Substance Use: Not Applicable Psych Involvement: No (comment)  Admission diagnosis:  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] Acute on chronic HFrEF (heart failure with reduced ejection fraction) (HCC) [I50.23] Acute on chronic congestive heart failure, unspecified heart failure type (HCC) [I50.9] Patient Active Problem List   Diagnosis Date Noted   Acute on chronic HFrEF (heart failure with reduced ejection fraction) (HCC) 06/26/2024   Hypoxemia 06/26/2024   Generalized weakness 06/26/2024   QT prolongation 06/26/2024   Endocarditis 05/09/2024   End stage congestive heart failure (HCC) 05/08/2024   Cardiomyopathy (HCC) 05/08/2024   NSVT (nonsustained ventricular tachycardia) (HCC) 05/08/2024   Severe sepsis (HCC) 05/04/2024   Sepsis (HCC) 05/04/2024   Advanced care planning/counseling discussion 04/24/2024   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   HFrEF (heart failure with reduced ejection fraction) (HCC) 03/24/2024   Small bowel obstruction (HCC) 11/14/2023   Small bowel obstruction due to adhesions (HCC) 11/14/2023   Cardiac resynchronization therapy defibrillator (CRT-D) in place 02/04/2023   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   Chronic post-operative pain 09/18/2022   Abnormality of gait 09/18/2022   LBBB (left bundle branch block) 08/19/2022   Chronic systolic heart failure (HCC) 08/19/2022   Chronic right-sided congestive heart failure (HCC) 05/18/2022   Ejection fraction < 50% 04/17/2022   Insomnia    Constipation    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   Ischemic cardiomyopathy    AKI (acute kidney injury) (HCC) 03/13/2022   Bacteremia due to group B Streptococcus 03/13/2022   Hypophosphatemia 03/13/2022   Septic shock (HCC) 03/08/2022   Acute systolic heart failure (HCC) 12/28/2020   Acute pulmonary edema (HCC) 10/11/2020   Non-ST elevation (NSTEMI) myocardial infarction (HCC) 10/11/2020   Acute respiratory distress 10/11/2020   Acute on chronic combined systolic and diastolic CHF (congestive heart failure) (HCC) 10/11/2020   Cardiogenic shock (HCC)  Partial nontraumatic amputation of foot, right (HCC) 06/21/2019   Osteomyelitis of right foot (HCC)    Essential hypertension, benign 02/06/2019   Hypercholesteremia 02/06/2019   Stage 3b chronic kidney disease (CKD) (HCC) 10/06/2017   Proteinuria 10/06/2017   Controlled type 2 diabetes mellitus with hyperglycemia, with long-term current use of insulin  (HCC) 10/06/2017   Atherosclerosis of native coronary artery of native heart without angina pectoris 05/23/2017   Abnormal nuclear stress test 09/06/2016   PAD (peripheral artery disease) (HCC) 10/28/2012   Morbid obesity with BMI of 40.0-44.9, adult (HCC) 10/13/2012   Diabetes mellitus with ophthalmic complication (HCC) 10/13/2012   Hypertension associated with diabetes (HCC) 10/13/2012   Atherosclerosis of native artery of extremity with intermittent claudication (HCC) 04/05/2012   Adenomatous colon polyp 09/28/2011   Diabetes mellitus (HCC) 09/28/2011   Pure hyperglyceridemia 09/28/2011   Erectile dysfunction 09/28/2011   Diabetic retinopathy (HCC) 09/28/2011   PCP:  [name], MD Pharmacy:   [facility] [phone] [place], [place] - [address] AT [facility] [address] & [facility] [address] [place] [address] Phone: [phone] Fax: [phone]  Social Drivers of Health (SDOH) Social History: SDOH Screenings   Food Insecurity: No Food Insecurity (06/27/2024)  Housing: Low Risk  (06/27/2024)  Transportation Needs: No Transportation Needs (06/27/2024)  Utilities: Not At Risk (06/27/2024)  Depression (PHQ2-9): Low Risk  (06/20/2024)  Financial Resource Strain: Low Risk  (07/28/2023)  Physical Activity: Sufficiently Active (07/28/2023)  Social Connections: Moderately Isolated (06/27/2024)  Stress: No Stress Concern Present (07/28/2023)  Tobacco Use: Medium Risk (06/27/2024)   SDOH Interventions:    Readmission Risk Interventions    06/27/2024   12:54 PM 05/08/2024    1:41 PM 03/28/2024   10:33 AM   Readmission Risk Prevention Plan  Transportation Screening Complete Complete Complete  Home Care Screening   Complete  Medication Review (RN CM)   Complete  HRI or Home Care Consult Complete    Social Work Consult for Recovery Care Planning/Counseling Complete    Palliative Care Screening Not Applicable    Medication Review Oceanographer) Complete Complete   PCP or Specialist appointment within 3-5 days of discharge  Complete   HRI or Home Care Consult  Complete   SW Recovery Care/Counseling Consult  Complete   Palliative Care Screening  Complete   Skilled Nursing Facility  Not Applicable

## 2024-06-27 NOTE — Progress Notes (Signed)
 PCCM INTERVAL PROGRESS NOTE  Further discussed care with [name]. Ready to transition to comfort care at this time.  Please see IPAL note for further details  Morphine  PRN D/c norepinephrine .  Chaplain request   Deward Eastern, AGACNP-BC [facility]  See Amion for personal pager PCCM on call pager [phone] until 7pm. Please call Elink 7p-7a. [phone]  06/27/2024 4:57 PM

## 2024-06-28 DIAGNOSIS — I509 Heart failure, unspecified: Secondary | ICD-10-CM

## 2024-06-28 DIAGNOSIS — C221 Intrahepatic bile duct carcinoma: Secondary | ICD-10-CM | POA: Diagnosis not present

## 2024-06-28 DIAGNOSIS — I5023 Acute on chronic systolic (congestive) heart failure: Secondary | ICD-10-CM | POA: Diagnosis not present

## 2024-06-28 MED ORDER — DIAZEPAM 5 MG/ML IJ SOLN: 2.5000 mg | Freq: Three times a day (TID) | INTRAMUSCULAR | Status: DC | PRN

## 2024-06-28 NOTE — Progress Notes (Signed)
 Physical Therapy Discharge Patient Details Name: Eduardo [name] MRN: [id number] DOB: 05/31/1956 Today's Date: 06/28/2024 Time:  -     Patient discharged from PT services secondary to medical decline - Patient now comfort care.therapy services.  Please see latest therapy progress note for current level of functioning and progress toward goals.    Progress and discharge plan discussed with patient and/or caregiver: NA  GP    Darice Potters PT Acute Rehabilitation Services Office [phone]  Potters Darice Norris 06/28/2024, 10:41 AM

## 2024-06-28 NOTE — Progress Notes (Signed)
  Progress Note   Patient: Eduardo [name] FMW:[id number] DOB: [birth year] DOA: 06/26/2024     2 DOS: the patient was seen and examined on 06/28/2024   Brief hospital course: 68 year old man complex PMH including chronic biventricular heart failure status post ICD placement presented with generalized weakness, shortness of breath, orthopnea, lower extremity edema.  Admitted for acute on chronic heart failure, hypoxia, AKI.   Assessment and Plan: Acute on chronic HFrEF/biventricular heart failure Acute hypoxic respiratory failure with respiratory distress Demand ischemia Multivessel CAD Status post ICD NSVT QTc prolongation During hospital course, pt developed worsened resp distress, leading up to respiratory failure needing bipap Cardiology was consulted and care was transferred to ICU service On further goals of care discussion, decision was made to transition to full comfort   AKI on CKD stage IIIa Likely cardiorenal in etiology given acutely decompensated CHF.   Later transitioned to full comfort   Chronic hypotension Was on midodrine  -Later transitioned to full comfort   Type 2 diabetes CBG 200s.  May need to adjust insulin .    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   -transitioned to full comfort     PAD Hyperlipidemia Transitioned to full comfort   BPH Transitioned to full comfort         Subjective: Unable to assess given mentation, pt lethargic  Physical Exam: Vitals:   06/27/24 1630 06/27/24 1635 06/27/24 1645 06/27/24 2200  BP:  (!) 85/63 (!) 89/67   Pulse: 93 95 93 85  Resp: (!) 23 (!) 23 18 (!) 7  Temp:      TempSrc:      SpO2: 98% 99% 99% 90%  Weight:      Height:       General exam: Asleep, laying in bed, in nad Respiratory system: Normal respiratory effort, no wheezing Cardiovascular system: regular rate, s1, s2 Gastrointestinal system: Soft, nondistended Central nervous system: Lethargic, no tremors, no seizures Extremities: Perfused, no  clubbing Skin: Normal skin turgor, no notable skin lesions seen Psychiatry: Unable to assess given mentation  Data Reviewed:  There are no new results to review at this time.  Family Communication: Pt in room, visitor at bedside  Disposition: Status is: Inpatient Remains inpatient appropriate because: severity of illness  Planned Discharge Destination: Likely hospital death     Author: Garnette Pelt, MD 06/28/2024 6:50 PM  For on call review [email or web address].

## 2024-06-28 NOTE — Progress Notes (Signed)
 OT Cancellation Note  Patient Details Name: BANJAMIN STOVALL MRN: [id number] DOB: 1956-04-27   Cancelled Treatment:    Reason Eval/Treat Not Completed: Other (comment). Pt is now comfort care. OT will sign off. Thank you.   Delanna LITTIE Lesches, OTR/L 06/28/2024, 4:01 PM

## 2024-06-28 NOTE — Progress Notes (Signed)
 Chaplain responded to spiritual care consult. I spent time with Eduardo [name]'s wife Eduardo [name], who was open to exploring a lot of her emotions and engaging in story sharing. She expressed gratitude for chaplain presence, and we remain available as needs arise.

## 2024-06-28 NOTE — Plan of Care (Signed)
  Problem: Education: Goal: Ability to describe self-care measures that may prevent or decrease complications (Diabetes Survival Skills Education) will improve Outcome: Progressing Goal: Individualized Educational Video(s) Outcome: Progressing   Problem: Coping: Goal: Ability to adjust to condition or change in health will improve Outcome: Progressing   Problem: Fluid Volume: Goal: Ability to maintain a balanced intake and output will improve Outcome: Progressing   Problem: Health Behavior/Discharge Planning: Goal: Ability to identify and utilize available resources and services will improve Outcome: Progressing Goal: Ability to manage health-related needs will improve Outcome: Progressing   Problem: Metabolic: Goal: Ability to maintain appropriate glucose levels will improve Outcome: Progressing   Problem: Nutritional: Goal: Maintenance of adequate nutrition will improve Outcome: Progressing Goal: Progress toward achieving an optimal weight will improve Outcome: Progressing   Problem: Skin Integrity: Goal: Risk for impaired skin integrity will decrease Outcome: Progressing   Problem: Tissue Perfusion: Goal: Adequacy of tissue perfusion will improve Outcome: Progressing   Problem: Education: Goal: Knowledge of General Education information will improve Description: Including pain rating scale, medication(s)/side effects and non-pharmacologic comfort measures Outcome: Progressing   Problem: Health Behavior/Discharge Planning: Goal: Ability to manage health-related needs will improve Outcome: Progressing   Problem: Clinical Measurements: Goal: Ability to maintain clinical measurements within normal limits will improve Outcome: Progressing Goal: Will remain free from infection Outcome: Progressing Goal: Diagnostic test results will improve Outcome: Progressing Goal: Respiratory complications will improve Outcome: Progressing Goal: Cardiovascular complication will  be avoided Outcome: Progressing   Problem: Activity: Goal: Risk for activity intolerance will decrease Outcome: Progressing   Problem: Nutrition: Goal: Adequate nutrition will be maintained Outcome: Progressing   Problem: Coping: Goal: Level of anxiety will decrease Outcome: Progressing   Problem: Pain Managment: Goal: General experience of comfort will improve and/or be controlled Outcome: Progressing   Problem: Safety: Goal: Ability to remain free from injury will improve Outcome: Progressing   Problem: Skin Integrity: Goal: Risk for impaired skin integrity will decrease Outcome: Progressing   Problem: Education: Goal: Ability to demonstrate management of disease process will improve Outcome: Progressing Goal: Ability to verbalize understanding of medication therapies will improve Outcome: Progressing Goal: Individualized Educational Video(s) Outcome: Progressing   Problem: Activity: Goal: Capacity to carry out activities will improve Outcome: Progressing   Problem: Cardiac: Goal: Ability to achieve and maintain adequate cardiopulmonary perfusion will improve Outcome: Progressing

## 2024-06-29 DIAGNOSIS — I5023 Acute on chronic systolic (congestive) heart failure: Secondary | ICD-10-CM | POA: Diagnosis not present

## 2024-06-29 DIAGNOSIS — I509 Heart failure, unspecified: Secondary | ICD-10-CM | POA: Diagnosis not present

## 2024-06-29 DIAGNOSIS — C221 Intrahepatic bile duct carcinoma: Secondary | ICD-10-CM | POA: Diagnosis not present

## 2024-06-30 ENCOUNTER — Encounter: Payer: Medicare Other | Admitting: Physical Medicine and Rehabilitation

## 2024-07-03 ENCOUNTER — Encounter: Admitting: Nutrition

## 2024-07-03 ENCOUNTER — Inpatient Hospital Stay

## 2024-07-03 ENCOUNTER — Inpatient Hospital Stay: Admitting: Oncology

## 2024-07-04 ENCOUNTER — Telehealth: Payer: Self-pay | Admitting: Family Medicine

## 2024-07-04 NOTE — Telephone Encounter (Signed)
Sympathy card sent 

## 2024-07-10 NOTE — Plan of Care (Signed)
  Problem: Education: Goal: Ability to describe self-care measures that may prevent or decrease complications (Diabetes Survival Skills Education) will improve Outcome: Completed/Met Goal: Individualized Educational Video(s) Outcome: Completed/Met   Problem: Coping: Goal: Ability to adjust to condition or change in health will improve Outcome: Completed/Met   Problem: Fluid Volume: Goal: Ability to maintain a balanced intake and output will improve Outcome: Completed/Met   Problem: Health Behavior/Discharge Planning: Goal: Ability to identify and utilize available resources and services will improve Outcome: Completed/Met Goal: Ability to manage health-related needs will improve Outcome: Completed/Met   Problem: Metabolic: Goal: Ability to maintain appropriate glucose levels will improve Outcome: Completed/Met   Problem: Nutritional: Goal: Maintenance of adequate nutrition will improve Outcome: Completed/Met Goal: Progress toward achieving an optimal weight will improve Outcome: Completed/Met   Problem: Skin Integrity: Goal: Risk for impaired skin integrity will decrease Outcome: Completed/Met   Problem: Tissue Perfusion: Goal: Adequacy of tissue perfusion will improve Outcome: Completed/Met   Problem: Education: Goal: Knowledge of General Education information will improve Description: Including pain rating scale, medication(s)/side effects and non-pharmacologic comfort measures Outcome: Completed/Met   Problem: Health Behavior/Discharge Planning: Goal: Ability to manage health-related needs will improve Outcome: Completed/Met   Problem: Clinical Measurements: Goal: Ability to maintain clinical measurements within normal limits will improve Outcome: Completed/Met Goal: Will remain free from infection Outcome: Completed/Met Goal: Diagnostic test results will improve Outcome: Completed/Met Goal: Respiratory complications will improve Outcome: Completed/Met Goal:  Cardiovascular complication will be avoided Outcome: Completed/Met   Problem: Activity: Goal: Risk for activity intolerance will decrease Outcome: Completed/Met   Problem: Nutrition: Goal: Adequate nutrition will be maintained Outcome: Completed/Met   Problem: Coping: Goal: Level of anxiety will decrease Outcome: Completed/Met   Problem: Elimination: Goal: Will not experience complications related to bowel motility Outcome: Completed/Met Goal: Will not experience complications related to urinary retention Outcome: Completed/Met   Problem: Pain Managment: Goal: General experience of comfort will improve and/or be controlled Outcome: Completed/Met   Problem: Safety: Goal: Ability to remain free from injury will improve Outcome: Completed/Met   Problem: Skin Integrity: Goal: Risk for impaired skin integrity will decrease Outcome: Completed/Met   Problem: Education: Goal: Ability to demonstrate management of disease process will improve Outcome: Completed/Met Goal: Ability to verbalize understanding of medication therapies will improve Outcome: Completed/Met Goal: Individualized Educational Video(s) Outcome: Completed/Met   Problem: Activity: Goal: Capacity to carry out activities will improve Outcome: Completed/Met   Problem: Cardiac: Goal: Ability to achieve and maintain adequate cardiopulmonary perfusion will improve Outcome: Completed/Met

## 2024-07-10 NOTE — Progress Notes (Signed)
 Chaplain responded to page request. I met with wife Eduardo [name], daughter Eduardo [name] and friend Eduardo [name]. At family's request, we engaged in an anointing and prayer ritual on pt Eduardo [name]'s behalf. A prayer shawl was also given. Family expressed gratitude for chaplain presence and we remain available as needed.

## 2024-07-10 NOTE — Death Summary Note (Signed)
 DEATH SUMMARY   Patient Details  Name: Eduardo [name] MRN: [id number] DOB: [birth year] ERE:Xwjee, Annabelle, MD Admission/Discharge Information   Admit Date:  Jun 30, 2024  Date of Death: Date of Death: 07-03-2024  Time of Death: Time of Death: 1058  Length of Stay: 3   Principle Cause of death: Acute on chronic HFrEF  Hospital Diagnoses: Principal Problem:   Acute on chronic HFrEF (heart failure with reduced ejection fraction) (HCC) Active Problems:   Diabetes mellitus (HCC)   AKI (acute kidney injury) (HCC)   Cholangiocarcinoma (HCC)   Hypoxemia   Generalized weakness   QT prolongation   Acute respiratory failure with hypoxia (HCC)   Pressure injury of skin   Acute on chronic congestive heart failure [facility])   Hospital Course: 68 year old man complex PMH including chronic biventricular heart failure status post ICD placement presented with generalized weakness, shortness of breath, orthopnea, lower extremity edema.  Admitted for acute on chronic heart failure, hypoxia, AKI.  Assessment and Plan: Acute on chronic HFrEF/biventricular heart failure Acute hypoxic respiratory failure with respiratory distress Demand ischemia Multivessel CAD Status post ICD NSVT QTc prolongation During hospital course, pt developed worsened resp distress, leading up to respiratory failure needing bipap Cardiology was consulted and care was transferred to ICU service On further goals of care discussion, decision was made to transition to full comfort On 07/03/24 at 1058, pt was pronounced   AKI on CKD stage IIIa Likely cardiorenal in etiology given acutely decompensated CHF.   Later transitioned to full comfort   Chronic hypotension Was on midodrine  -Later transitioned to full comfort   Type 2 diabetes Transitioned to full comfort care    Cholangiocarcinoma Was on pemigatinib .   -transitioned to full comfort     PAD Hyperlipidemia Transitioned to full comfort   BPH Transitioned to full  comfort          Consultations: PCCM, Cardiology  The results of significant diagnostics from this hospitalization (including imaging, microbiology, ancillary and laboratory) are listed below for reference.   Significant Diagnostic Studies: DG CHEST PORT 1 VIEW Result Date: 06/27/2024 CLINICAL DATA:  Pulmonary edema. EXAM: PORTABLE CHEST 1 VIEW COMPARISON:  Pulmonary edema. FINDINGS: Stable cardiomediastinal silhouette. Left-sided defibrillator is unchanged. Small left pleural effusion is noted with associated left basilar atelectasis or infiltrate. Right lung is clear. Bony thorax is unremarkable. IMPRESSION: Small left pleural effusion is noted with associated left basilar atelectasis or infiltrate. Electronically Signed   By: Lynwood Landy Raddle M.D.   On: 06/27/2024 13:04   CT Head Wo Contrast Result Date: 06-30-24 CLINICAL DATA:  Weakness for 3 days, history of liver cancer, ataxia EXAM: CT HEAD WITHOUT CONTRAST TECHNIQUE: Contiguous axial images were obtained from the base of the skull through the vertex without intravenous contrast. RADIATION DOSE REDUCTION: This exam was performed according to the departmental dose-optimization program which includes automated exposure control, adjustment of the mA and/or kV according to patient size and/or use of iterative reconstruction technique. COMPARISON:  None Available. FINDINGS: Brain: No acute infarct or hemorrhage. Lateral ventricles and midline structures are unremarkable. No acute extra-axial fluid collections. No mass effect. Vascular: No hyperdense vessel or unexpected calcification. Skull: Normal. Negative for fracture or focal lesion. Sinuses/Orbits: No acute finding. 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 Other: None. IMPRESSION: 1. No acute intracranial process. Electronically Signed   By: Ozell Daring M.D.   On: 06/30/24 17:38   DG Chest 1 View Result Date: 06/30/2024 CLINICAL DATA:  Cough EXAM: CHEST  1  VIEW COMPARISON:   Chest x-ray 05/04/2024 FINDINGS: Left-sided ICD is present. The heart is enlarged, unchanged. There central pulmonary vascular congestion. There is a small left pleural effusion with minimal left basilar opacities, unchanged from prior. No pneumothorax or acute fracture. IMPRESSION: 1. Cardiomegaly with central pulmonary vascular congestion. 2. Stable small left pleural effusion with minimal left basilar opacities. Electronically Signed   By: Greig Pique M.D.   On: 06/26/2024 17:07    Microbiology: Recent Results (from the past 240 hours)  Resp panel by RT-PCR (RSV, Flu A&B, Covid) Anterior Nasal Swab     Status: None   Collection Time: 06/26/24  4:57 PM   Specimen: Anterior Nasal Swab  Result Value Ref Range Status   SARS Coronavirus 2 by RT PCR NEGATIVE NEGATIVE Final    Comment: (NOTE) SARS-CoV-2 target nucleic acids are NOT DETECTED.  The SARS-CoV-2 RNA is generally detectable in upper respiratory specimens during the acute phase of infection. The lowest concentration of SARS-CoV-2 viral copies this assay can detect is 138 copies/mL. A negative result does not preclude SARS-Cov-2 infection and should not be used as the sole basis for treatment or other patient management decisions. A negative result may occur with  improper specimen collection/handling, submission of specimen other than nasopharyngeal swab, presence of viral mutation(s) within the areas targeted by this assay, and inadequate number of viral copies(<138 copies/mL). A negative result must be combined with clinical observations, patient history, and epidemiological information. The expected result is Negative.  Fact Sheet for Patients:  BloggerCourse.com  Fact Sheet for Healthcare Providers:  SeriousBroker.it  This test is no t yet approved or cleared by the [place]  FDA and  has been authorized for detection and/or diagnosis of SARS-CoV-2 by FDA under an  Emergency Use Authorization (EUA). This EUA will remain  in effect (meaning this test can be used) for the duration of the COVID-19 declaration under Section 564(b)(1) of the Act, 21 U.S.C.section 360bbb-3(b)(1), unless the authorization is terminated  or revoked sooner.       Influenza A by PCR NEGATIVE NEGATIVE Final   Influenza B by PCR NEGATIVE NEGATIVE Final    Comment: (NOTE) The Xpert Xpress SARS-CoV-2/FLU/RSV plus assay is intended as an aid in the diagnosis of influenza from Nasopharyngeal swab specimens and should not be used as a sole basis for treatment. Nasal washings and aspirates are unacceptable for Xpert Xpress SARS-CoV-2/FLU/RSV testing.  Fact Sheet for Patients: BloggerCourse.com  Fact Sheet for Healthcare Providers: SeriousBroker.it  This test is not yet approved or cleared by the [place]  FDA and has been authorized for detection and/or diagnosis of SARS-CoV-2 by FDA under an Emergency Use Authorization (EUA). This EUA will remain in effect (meaning this test can be used) for the duration of the COVID-19 declaration under Section 564(b)(1) of the Act, 21 U.S.C. section 360bbb-3(b)(1), unless the authorization is terminated or revoked.     Resp Syncytial Virus by PCR NEGATIVE NEGATIVE Final    Comment: (NOTE) Fact Sheet for Patients: BloggerCourse.com  Fact Sheet for Healthcare Providers: SeriousBroker.it  This test is not yet approved or cleared by the [place]  FDA and has been authorized for detection and/or diagnosis of SARS-CoV-2 by FDA under an Emergency Use Authorization (EUA). This EUA will remain in effect (meaning this test can be used) for the duration of the COVID-19 declaration under Section 564(b)(1) of the Act, 21 U.S.C. section 360bbb-3(b)(1), unless the authorization is terminated or revoked.  Performed at [facility], [address] [place], [place] [address]   MRSA Next Gen by PCR, Nasal     Status: None   Collection Time: 06/27/24  2:17 PM   Specimen: Nasal Mucosa; Nasal Swab  Result Value Ref Range Status   MRSA by PCR Next Gen NOT DETECTED NOT DETECTED Final    Comment: (NOTE) The GeneXpert MRSA Assay (FDA approved for NASAL specimens only), is one component of a comprehensive MRSA colonization surveillance program. It is not intended to diagnose MRSA infection nor to guide or monitor treatment for MRSA infections. Test performance is not FDA approved in patients less than 10 years old. Performed at [facility], [address], [place], [place] [address]     Signed: Garnette Pelt, MD 2024-07-01

## 2024-07-10 DEATH — deceased

## 2024-07-20 ENCOUNTER — Ambulatory Visit: Payer: Medicare Other

## 2024-08-02 ENCOUNTER — Ambulatory Visit: Payer: Medicare Other | Admitting: Family Medicine

## 2024-08-22 ENCOUNTER — Telehealth: Admitting: Cardiology

## 2024-08-28 ENCOUNTER — Other Ambulatory Visit: Payer: Self-pay | Admitting: Cardiology

## 2024-08-28 DIAGNOSIS — I2510 Atherosclerotic heart disease of native coronary artery without angina pectoris: Secondary | ICD-10-CM

## 2024-08-28 DIAGNOSIS — I255 Ischemic cardiomyopathy: Secondary | ICD-10-CM

## 2024-08-28 DIAGNOSIS — I5022 Chronic systolic (congestive) heart failure: Secondary | ICD-10-CM

## 2024-10-09 ENCOUNTER — Other Ambulatory Visit (HOSPITAL_COMMUNITY): Payer: Self-pay

## 2024-10-19 ENCOUNTER — Ambulatory Visit: Payer: Medicare Other

## 2024-11-24 ENCOUNTER — Ambulatory Visit: Admitting: Physical Medicine and Rehabilitation
